# Patient Record
Sex: Female | Born: 1950
Health system: Southern US, Community
[De-identification: ages and names within clinical notes are randomized; demographics above are authoritative.]

## PROBLEM LIST (undated history)

## (undated) DIAGNOSIS — E785 Hyperlipidemia, unspecified: Secondary | ICD-10-CM

## (undated) DIAGNOSIS — N179 Acute kidney failure, unspecified: Secondary | ICD-10-CM

## (undated) DIAGNOSIS — I340 Nonrheumatic mitral (valve) insufficiency: Secondary | ICD-10-CM

## (undated) DIAGNOSIS — K219 Gastro-esophageal reflux disease without esophagitis: Secondary | ICD-10-CM

## (undated) DIAGNOSIS — I10 Essential (primary) hypertension: Secondary | ICD-10-CM

## (undated) DIAGNOSIS — I739 Peripheral vascular disease, unspecified: Secondary | ICD-10-CM

## (undated) DIAGNOSIS — T7840XA Allergy, unspecified, initial encounter: Secondary | ICD-10-CM

## (undated) DIAGNOSIS — Z9889 Other specified postprocedural states: Secondary | ICD-10-CM

## (undated) DIAGNOSIS — H269 Unspecified cataract: Secondary | ICD-10-CM

## (undated) DIAGNOSIS — I480 Paroxysmal atrial fibrillation: Secondary | ICD-10-CM

## (undated) DIAGNOSIS — I2510 Atherosclerotic heart disease of native coronary artery without angina pectoris: Secondary | ICD-10-CM

## (undated) DIAGNOSIS — I499 Cardiac arrhythmia, unspecified: Secondary | ICD-10-CM

## (undated) DIAGNOSIS — G43909 Migraine, unspecified, not intractable, without status migrainosus: Secondary | ICD-10-CM

## (undated) DIAGNOSIS — M1990 Unspecified osteoarthritis, unspecified site: Secondary | ICD-10-CM

## (undated) DIAGNOSIS — F32A Depression, unspecified: Secondary | ICD-10-CM

## (undated) DIAGNOSIS — Z9289 Personal history of other medical treatment: Secondary | ICD-10-CM

## (undated) DIAGNOSIS — H409 Unspecified glaucoma: Secondary | ICD-10-CM

## (undated) DIAGNOSIS — D72829 Elevated white blood cell count, unspecified: Secondary | ICD-10-CM

## (undated) DIAGNOSIS — R51 Headache: Secondary | ICD-10-CM

## (undated) DIAGNOSIS — R52 Pain, unspecified: Secondary | ICD-10-CM

## (undated) DIAGNOSIS — Z789 Other specified health status: Secondary | ICD-10-CM

## (undated) DIAGNOSIS — Z5189 Encounter for other specified aftercare: Secondary | ICD-10-CM

## (undated) DIAGNOSIS — K760 Fatty (change of) liver, not elsewhere classified: Secondary | ICD-10-CM

## (undated) DIAGNOSIS — G459 Transient cerebral ischemic attack, unspecified: Secondary | ICD-10-CM

## (undated) DIAGNOSIS — I639 Cerebral infarction, unspecified: Secondary | ICD-10-CM

## (undated) DIAGNOSIS — M810 Age-related osteoporosis without current pathological fracture: Secondary | ICD-10-CM

## (undated) DIAGNOSIS — R519 Headache, unspecified: Secondary | ICD-10-CM

## (undated) DIAGNOSIS — D45 Polycythemia vera: Secondary | ICD-10-CM

## (undated) DIAGNOSIS — I214 Non-ST elevation (NSTEMI) myocardial infarction: Secondary | ICD-10-CM

## (undated) HISTORY — DX: Polycythemia vera: D45

## (undated) HISTORY — DX: Elevated white blood cell count, unspecified: D72.829

## (undated) HISTORY — PX: OVARIAN CYST REMOVAL: SHX89

## (undated) HISTORY — DX: Encounter for other specified aftercare: Z51.89

## (undated) HISTORY — DX: Depression, unspecified: F32.A

## (undated) HISTORY — DX: Allergy, unspecified, initial encounter: T78.40XA

## (undated) HISTORY — DX: Atherosclerotic heart disease of native coronary artery without angina pectoris: I25.10

## (undated) HISTORY — PX: FRACTURE SURGERY: SHX138

## (undated) HISTORY — DX: Cerebral infarction, unspecified: I63.9

## (undated) HISTORY — PX: ABDOMINAL HYSTERECTOMY: SHX81

## (undated) HISTORY — DX: Peripheral vascular disease, unspecified: I73.9

## (undated) HISTORY — DX: Other specified postprocedural states: Z98.890

## (undated) HISTORY — PX: REFRACTIVE SURGERY: SHX103

## (undated) HISTORY — PX: EYE SURGERY: SHX253

## (undated) HISTORY — DX: Other specified health status: Z78.9

## (undated) HISTORY — DX: Acute kidney failure, unspecified: N17.9

## (undated) HISTORY — DX: Age-related osteoporosis without current pathological fracture: M81.0

## (undated) HISTORY — DX: Unspecified cataract: H26.9

## (undated) HISTORY — DX: Nonrheumatic mitral (valve) insufficiency: I34.0

---

## 1981-12-21 HISTORY — PX: ORIF WRIST FRACTURE: SHX2133

## 1981-12-21 HISTORY — PX: TUBAL LIGATION: SHX77

## 2001-09-20 ENCOUNTER — Other Ambulatory Visit: Admission: RE | Admit: 2001-09-20 | Discharge: 2001-09-20 | Payer: Self-pay

## 2001-10-14 ENCOUNTER — Ambulatory Visit (HOSPITAL_COMMUNITY): Admission: RE | Admit: 2001-10-14 | Discharge: 2001-10-14 | Payer: Self-pay | Admitting: Family Medicine

## 2001-10-14 ENCOUNTER — Encounter: Payer: Self-pay | Admitting: Family Medicine

## 2001-11-07 ENCOUNTER — Encounter: Payer: Self-pay | Admitting: Family Medicine

## 2001-11-07 ENCOUNTER — Ambulatory Visit (HOSPITAL_COMMUNITY): Admission: RE | Admit: 2001-11-07 | Discharge: 2001-11-07 | Payer: Self-pay | Admitting: Family Medicine

## 2003-02-06 ENCOUNTER — Emergency Department (HOSPITAL_COMMUNITY): Admission: EM | Admit: 2003-02-06 | Discharge: 2003-02-06 | Payer: Self-pay | Admitting: *Deleted

## 2003-02-13 ENCOUNTER — Inpatient Hospital Stay (HOSPITAL_COMMUNITY): Admission: EM | Admit: 2003-02-13 | Discharge: 2003-02-15 | Payer: Self-pay | Admitting: Emergency Medicine

## 2003-02-13 ENCOUNTER — Encounter: Payer: Self-pay | Admitting: Emergency Medicine

## 2003-02-13 IMAGING — CT CT HEAD W/O CM
1 series · 16 of 30 positions shown, 20 images · non-contrast
Comparison: none

FINDINGS
CLINICAL DATA: INTERMITTENT HEADACHES FOR FIVE DAYS.  VOMITING.
CT OF THE HEAD WITHOUT CONTRAST
NO COMPARISON.  ROUTINE UNENHANCED STUDY WAS PERFORMED.  THERE IS NO EVIDENCE OF ACUTE INTRACRANIAL
HEMORRHAGE, MASS EFFECT, OR EXTRA-AXIAL FLUID COLLECTION.  THE VENTRICLES AND SUBARACHNOID SPACES
ARE APPROPRIATELY SIZED FOR AGE.  VISUALIZED PARANASAL SINUSES ARE CLEAR.  NO CALVARIAL ABNORMALITY
IS SEEN.
IMPRESSION
NO ACUTE INTRACRANIAL FINDINGS DEMONSTRATED.  NO EVIDENCE OF ACUTE INTRACRANIAL HEMORRHAGE.

[Series 5852: — · axial · 0.49mm/px · z∈[-599,-464]mm · 16 of 30 slices shown, 20 images]
[im 2/30  brain]
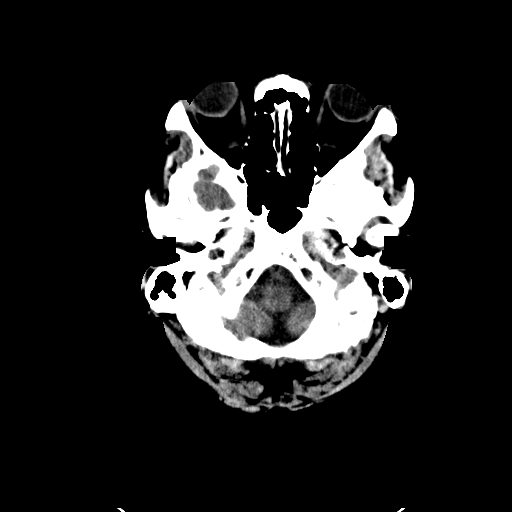
[im 2/30  bone]
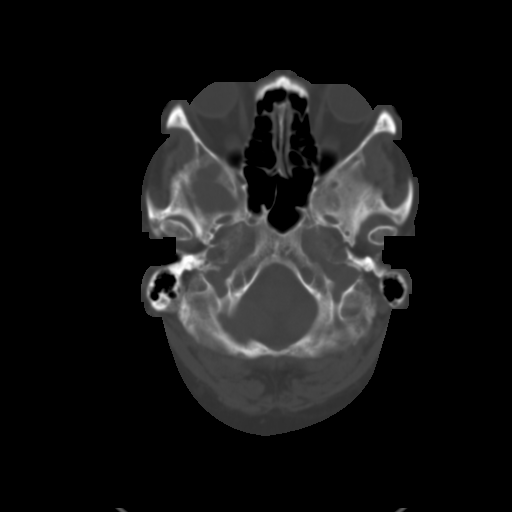
[im 4/30  brain]
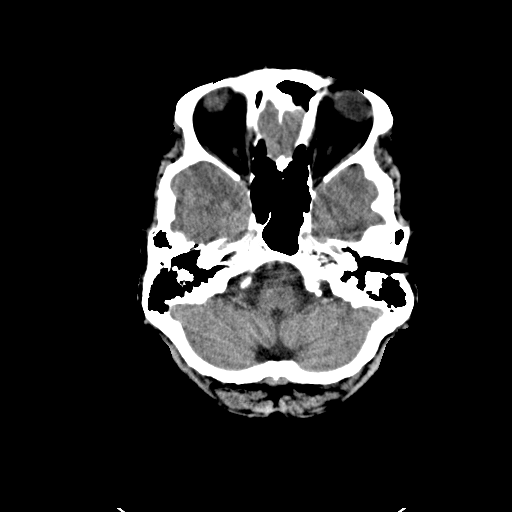
[im 6/30  brain]
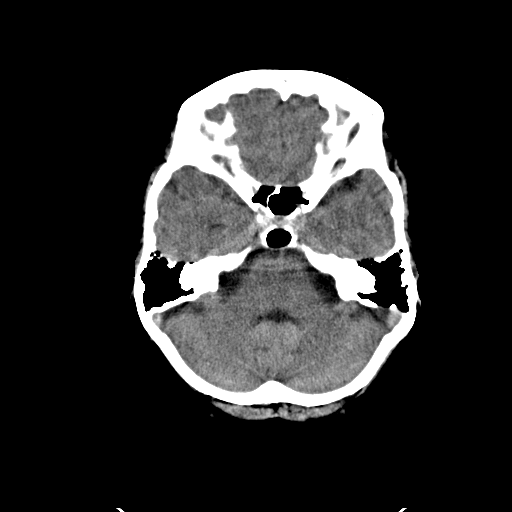
[im 8/30  brain]
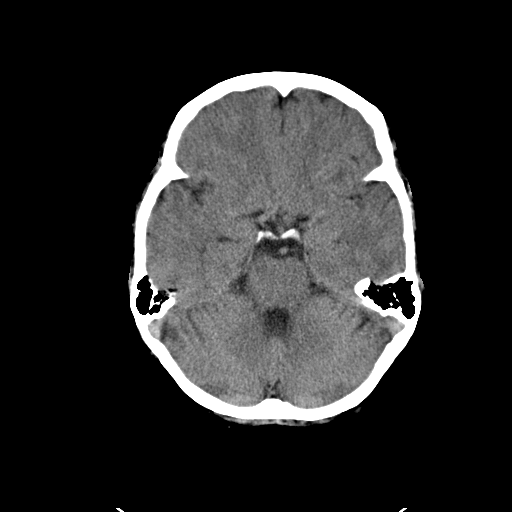
[im 9/30  brain]
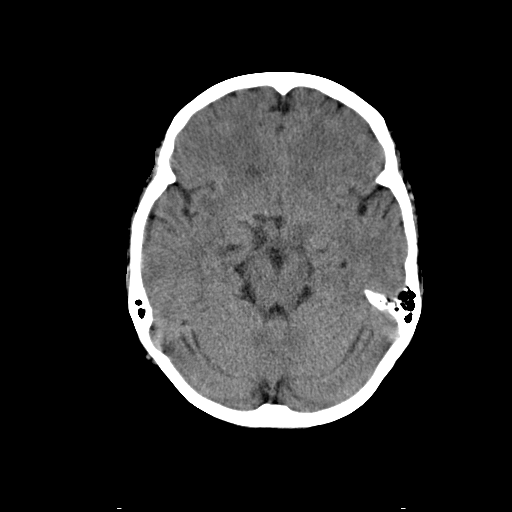
[im 9/30  bone]
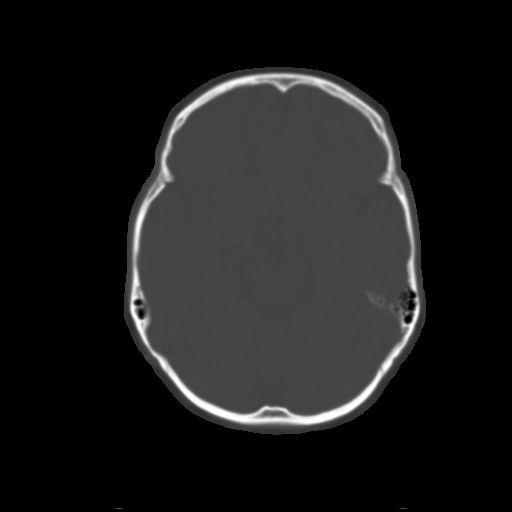
[im 11/30  brain]
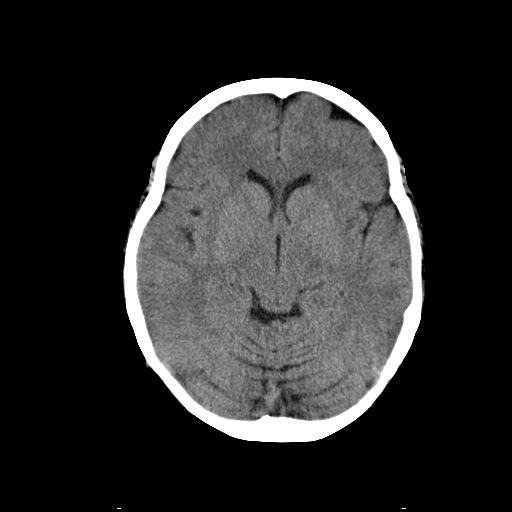
[im 13/30  brain]
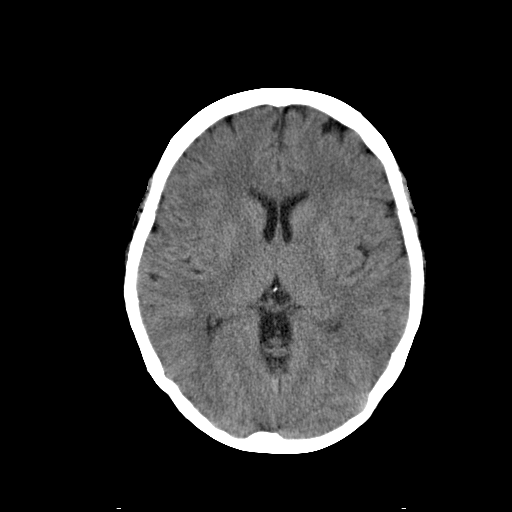
[im 15/30  brain]
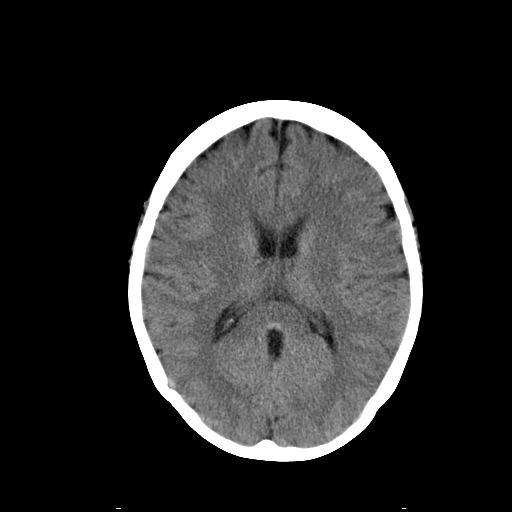
[im 16/30  brain]
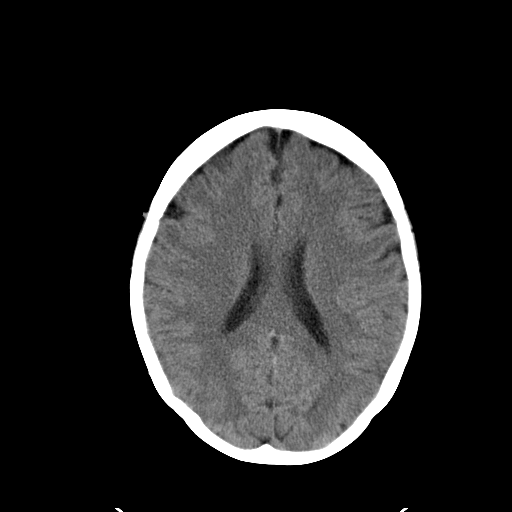
[im 16/30  bone]
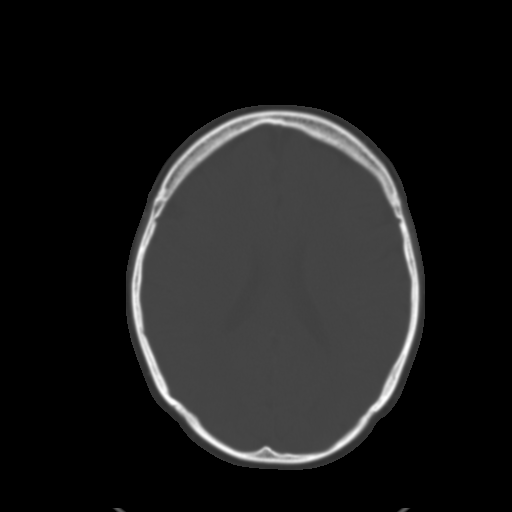
[im 18/30  brain]
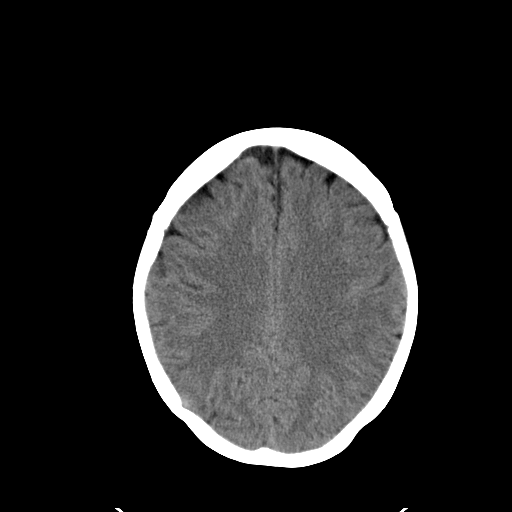
[im 20/30  brain]
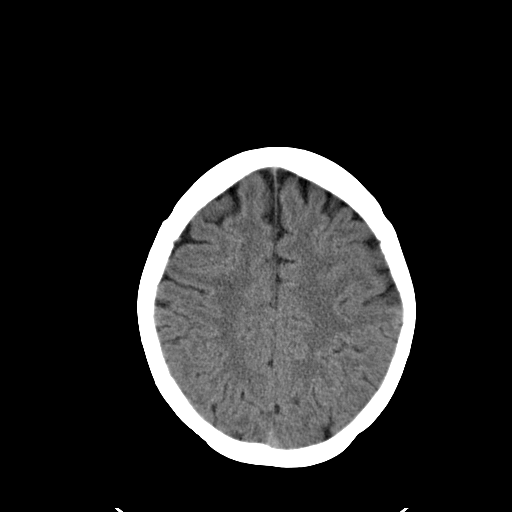
[im 22/30  brain]
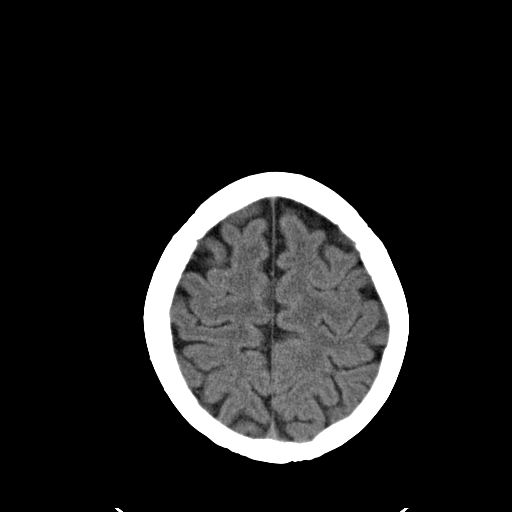
[im 23/30  brain]
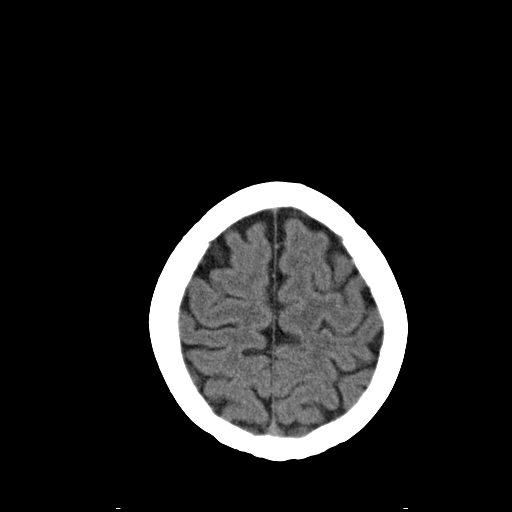
[im 23/30  bone]
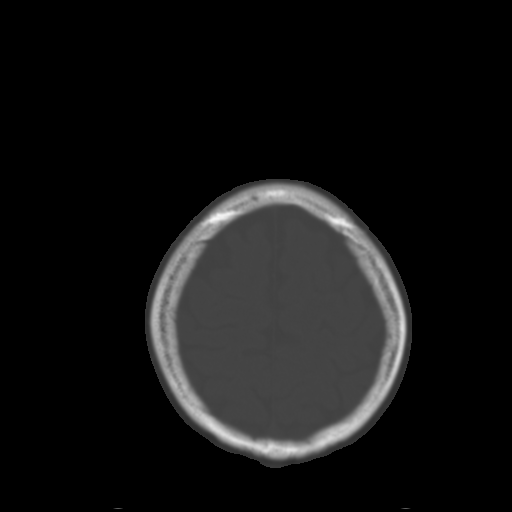
[im 25/30  brain]
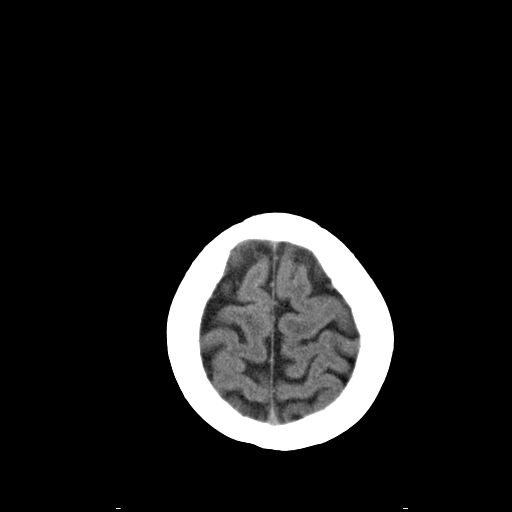
[im 27/30  brain]
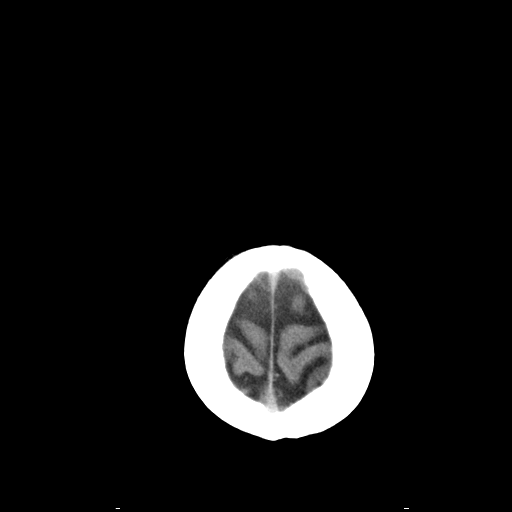
[im 29/30  brain]
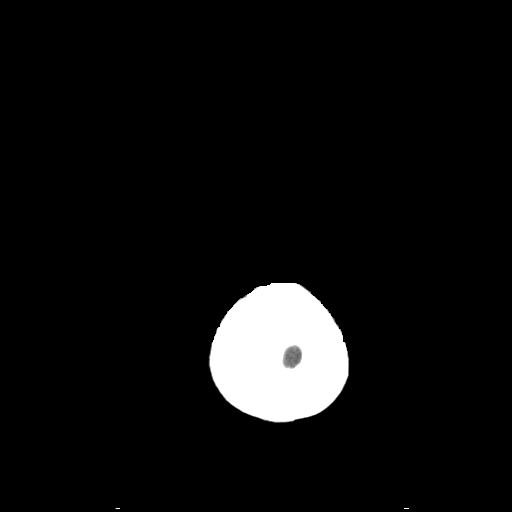

[16 of 30 positions shown; findings below may reference images not displayed]

## 2003-02-14 ENCOUNTER — Encounter: Payer: Self-pay | Admitting: Internal Medicine

## 2004-03-31 ENCOUNTER — Emergency Department (HOSPITAL_COMMUNITY): Admission: EM | Admit: 2004-03-31 | Discharge: 2004-03-31 | Payer: Self-pay | Admitting: *Deleted

## 2004-12-21 DIAGNOSIS — I639 Cerebral infarction, unspecified: Secondary | ICD-10-CM

## 2004-12-21 HISTORY — DX: Cerebral infarction, unspecified: I63.9

## 2005-03-31 ENCOUNTER — Emergency Department (HOSPITAL_COMMUNITY): Admission: EM | Admit: 2005-03-31 | Discharge: 2005-03-31 | Payer: Self-pay | Admitting: Emergency Medicine

## 2005-05-09 ENCOUNTER — Emergency Department (HOSPITAL_COMMUNITY): Admission: EM | Admit: 2005-05-09 | Discharge: 2005-05-09 | Payer: Self-pay | Admitting: Emergency Medicine

## 2005-08-27 ENCOUNTER — Ambulatory Visit (HOSPITAL_COMMUNITY): Admission: RE | Admit: 2005-08-27 | Discharge: 2005-08-27 | Payer: Self-pay | Admitting: Family Medicine

## 2005-09-06 ENCOUNTER — Emergency Department (HOSPITAL_COMMUNITY): Admission: EM | Admit: 2005-09-06 | Discharge: 2005-09-06 | Payer: Self-pay | Admitting: Emergency Medicine

## 2005-09-06 IMAGING — CT CT HEAD W/O CM
1 series · 15 of 30 positions shown, 19 images · non-contrast
Comparison: Unenhanced cranial CT [DATE].

CLINICAL DATA: Headache. Nausea/vomiting.

CRANIAL CT WITHOUT CONTRAST  [DATE]:
TECHNIQUE: 5mm axial images were obtained from the skull base through the
vertex without intravenous contrast.

[Series 177: — · axial · 0.46mm/px · z∈[-640,-505]mm · 15 of 30 slices shown, 19 images]
[im 2/30  brain]
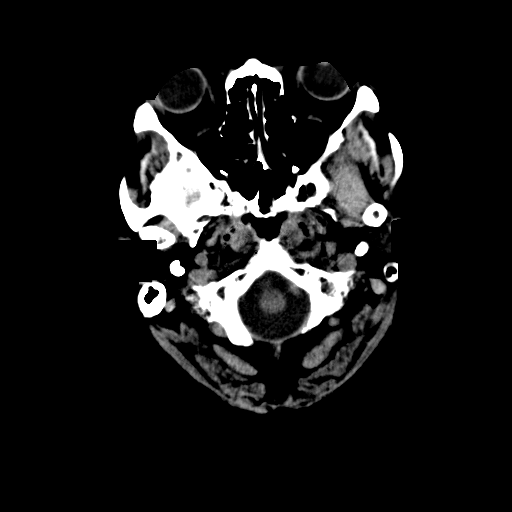
[im 2/30  bone]
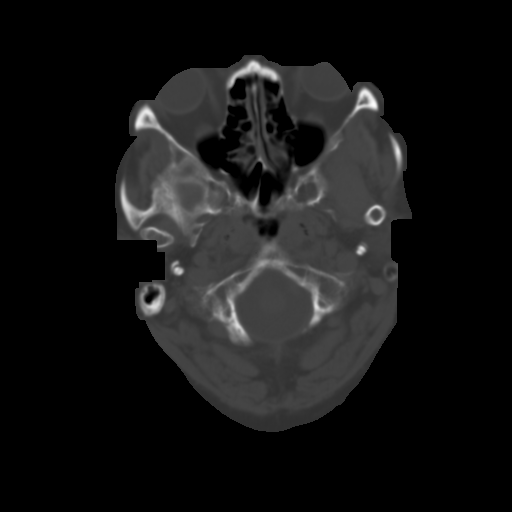
[im 4/30  brain]
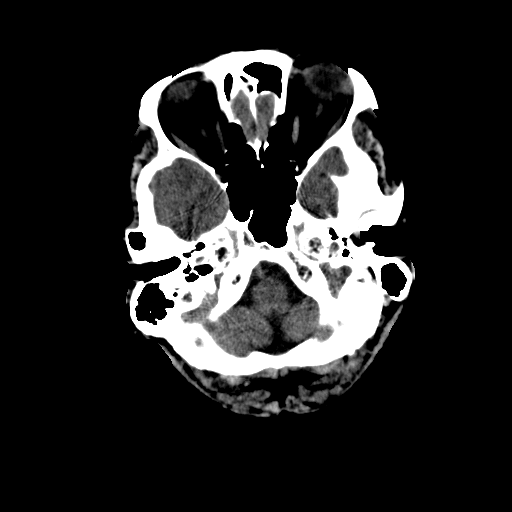
[im 6/30  brain]
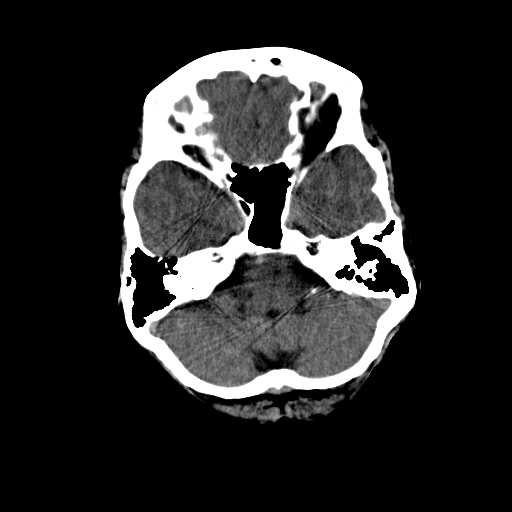
[im 8/30  brain]
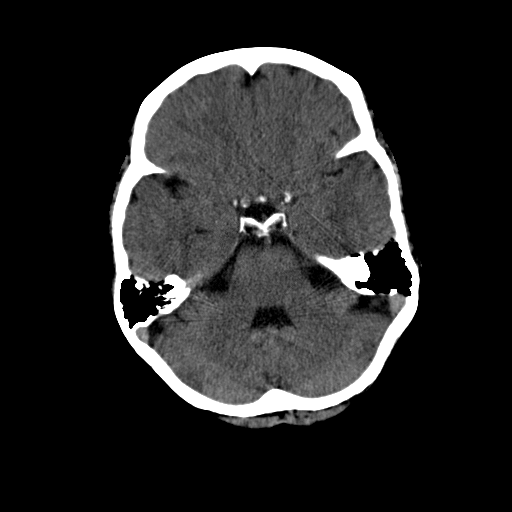
[im 10/30  brain]
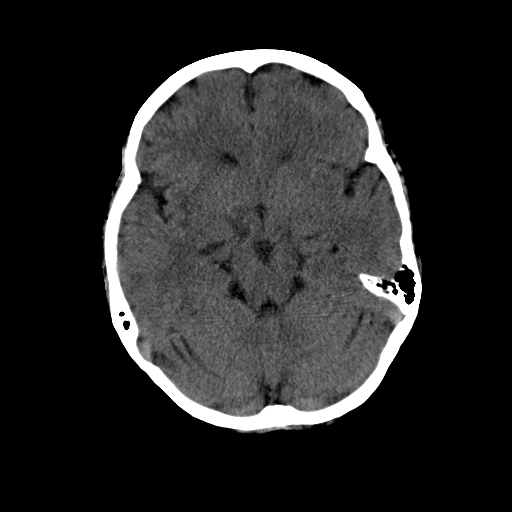
[im 10/30  bone]
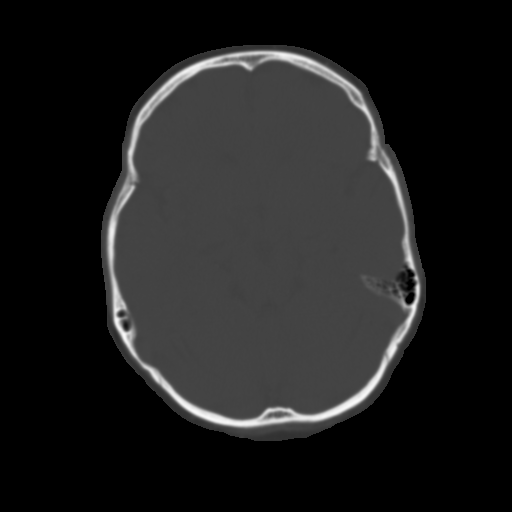
[im 12/30  brain]
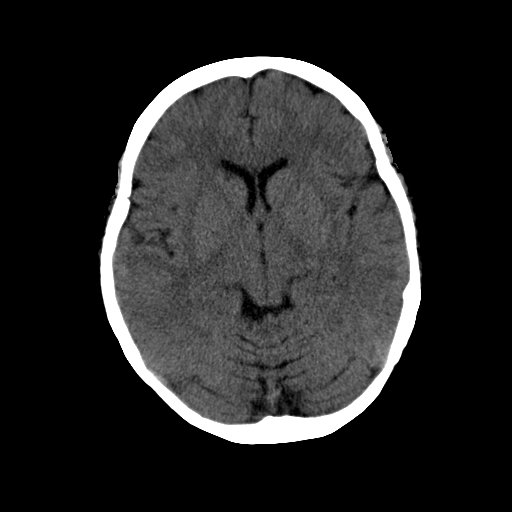
[im 14/30  brain]
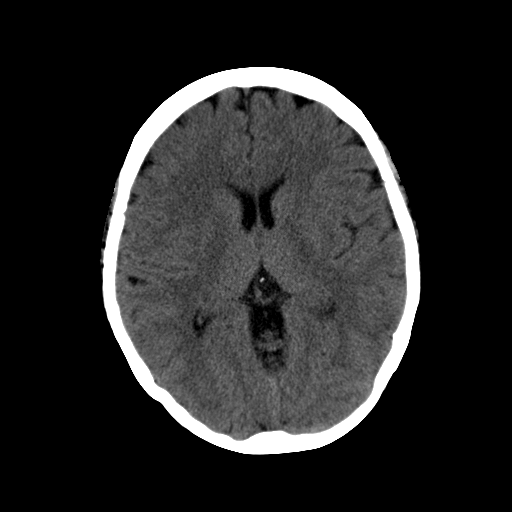
[im 16/30  brain]
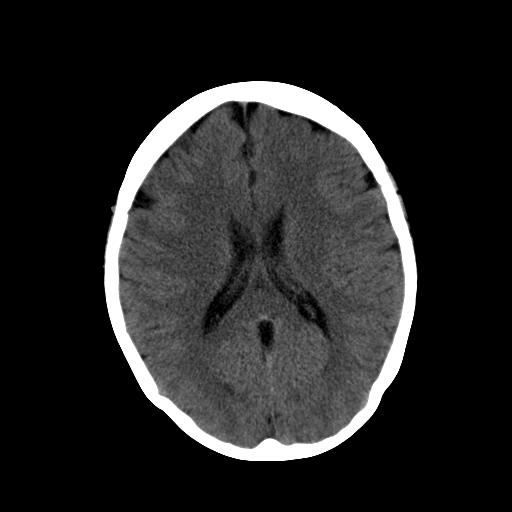
[im 17/30  brain]
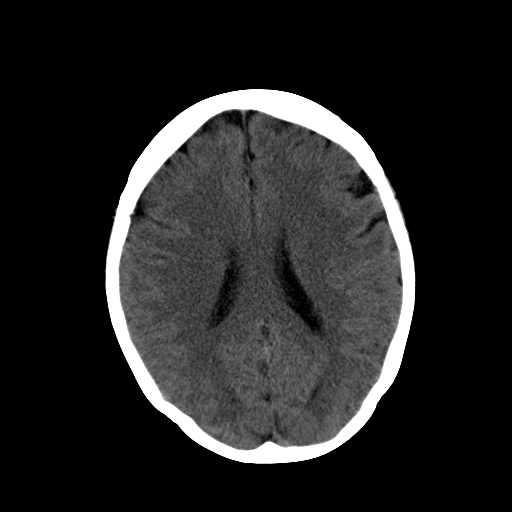
[im 17/30  bone]
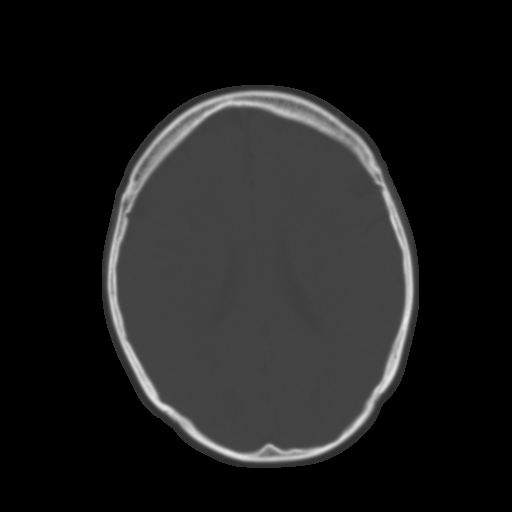
[im 19/30  brain]
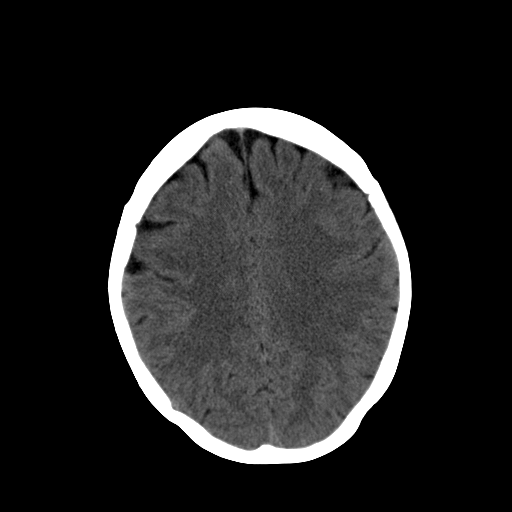
[im 21/30  brain]
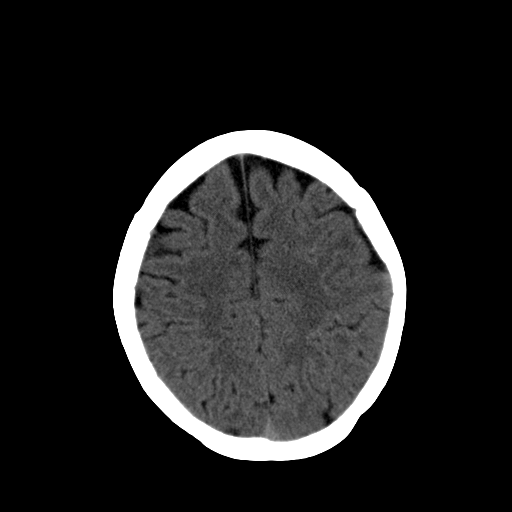
[im 23/30  brain]
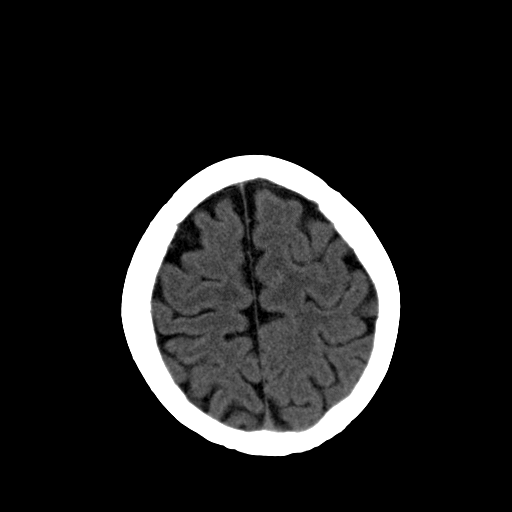
[im 25/30  brain]
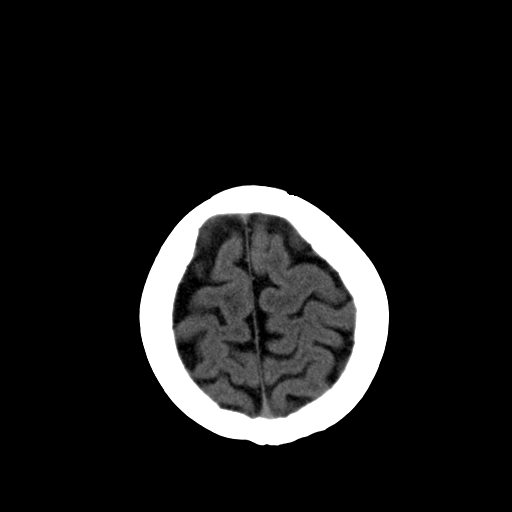
[im 25/30  bone]
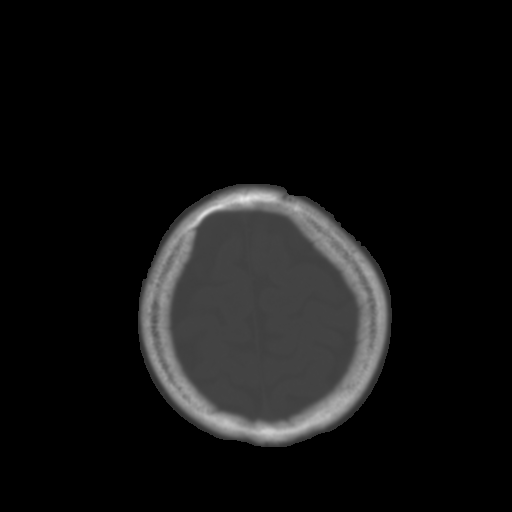
[im 27/30  brain]
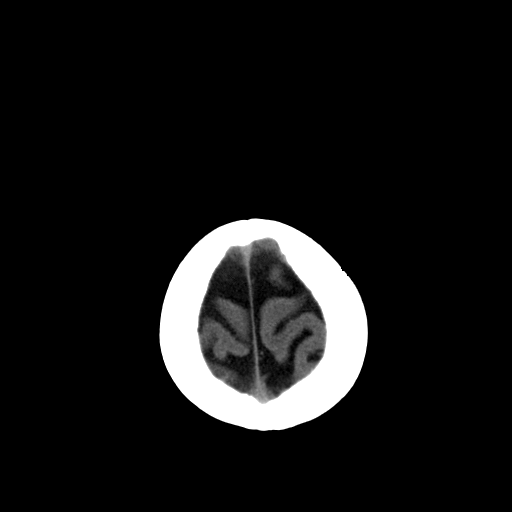
[im 29/30  brain]
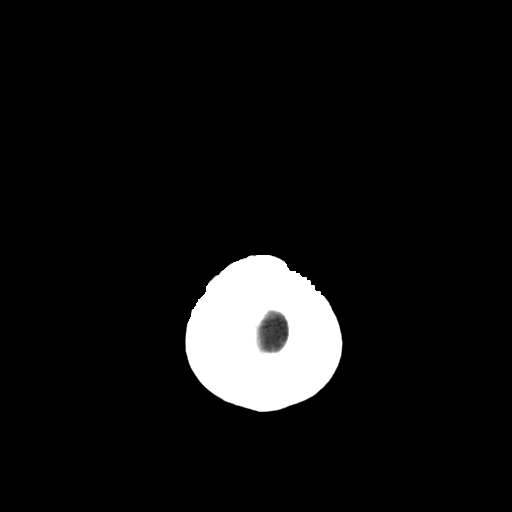

[15 of 30 positions shown; findings below may reference images not displayed]

FINDINGS: The ventricular system is normal in size and appearance for age.
There is no mass-effect or midline shift. There is no hemorrhage or hematoma. No
extra-axial fluid collections are identified. There is an old lacunar stroke in
the head of the right caudate nucleus which is new since the previous
examination but is certainly not acute. Again demonstrated and unchanged is the
lacunar stroke in the posterior-inferior  right thalamus. There is no evidence
of acute stroke at this time. Mild cortical atrophy and mild cerebellar atrophy
are unchanged.

The bone window images demonstrate no focal osseous abnormalities involving the
skull. The visualized paranasal sinuses and the mastoid air cells appear
well-aerated. Mild bilateral carotid siphon atherosclerosis is noted.
IMPRESSION: 1. No acute intracranial abnormalities.
2. Old lacunar strokes in the right basal ganglia and right thalamus.

## 2005-10-04 ENCOUNTER — Emergency Department (HOSPITAL_COMMUNITY): Admission: EM | Admit: 2005-10-04 | Discharge: 2005-10-04 | Payer: Self-pay | Admitting: Emergency Medicine

## 2005-10-27 ENCOUNTER — Emergency Department (HOSPITAL_COMMUNITY): Admission: EM | Admit: 2005-10-27 | Discharge: 2005-10-28 | Payer: Self-pay | Admitting: Emergency Medicine

## 2006-01-02 ENCOUNTER — Emergency Department (HOSPITAL_COMMUNITY): Admission: EM | Admit: 2006-01-02 | Discharge: 2006-01-02 | Payer: Self-pay | Admitting: Emergency Medicine

## 2006-03-06 ENCOUNTER — Emergency Department (HOSPITAL_COMMUNITY): Admission: EM | Admit: 2006-03-06 | Discharge: 2006-03-06 | Payer: Self-pay | Admitting: Emergency Medicine

## 2006-07-03 ENCOUNTER — Emergency Department (HOSPITAL_COMMUNITY): Admission: EM | Admit: 2006-07-03 | Discharge: 2006-07-03 | Payer: Self-pay | Admitting: Emergency Medicine

## 2006-07-19 ENCOUNTER — Ambulatory Visit (HOSPITAL_COMMUNITY): Admission: RE | Admit: 2006-07-19 | Discharge: 2006-07-19 | Payer: Self-pay | Admitting: Cardiovascular Disease

## 2006-07-19 IMAGING — CR DG CHEST 2V
2 series · 2 of 2 positions shown · non-contrast
Comparison: none

CLINICAL DATA: Pre-cardiac catheterization.  
 CHEST - 2 VIEW:

[view not recorded (1 of 2)]
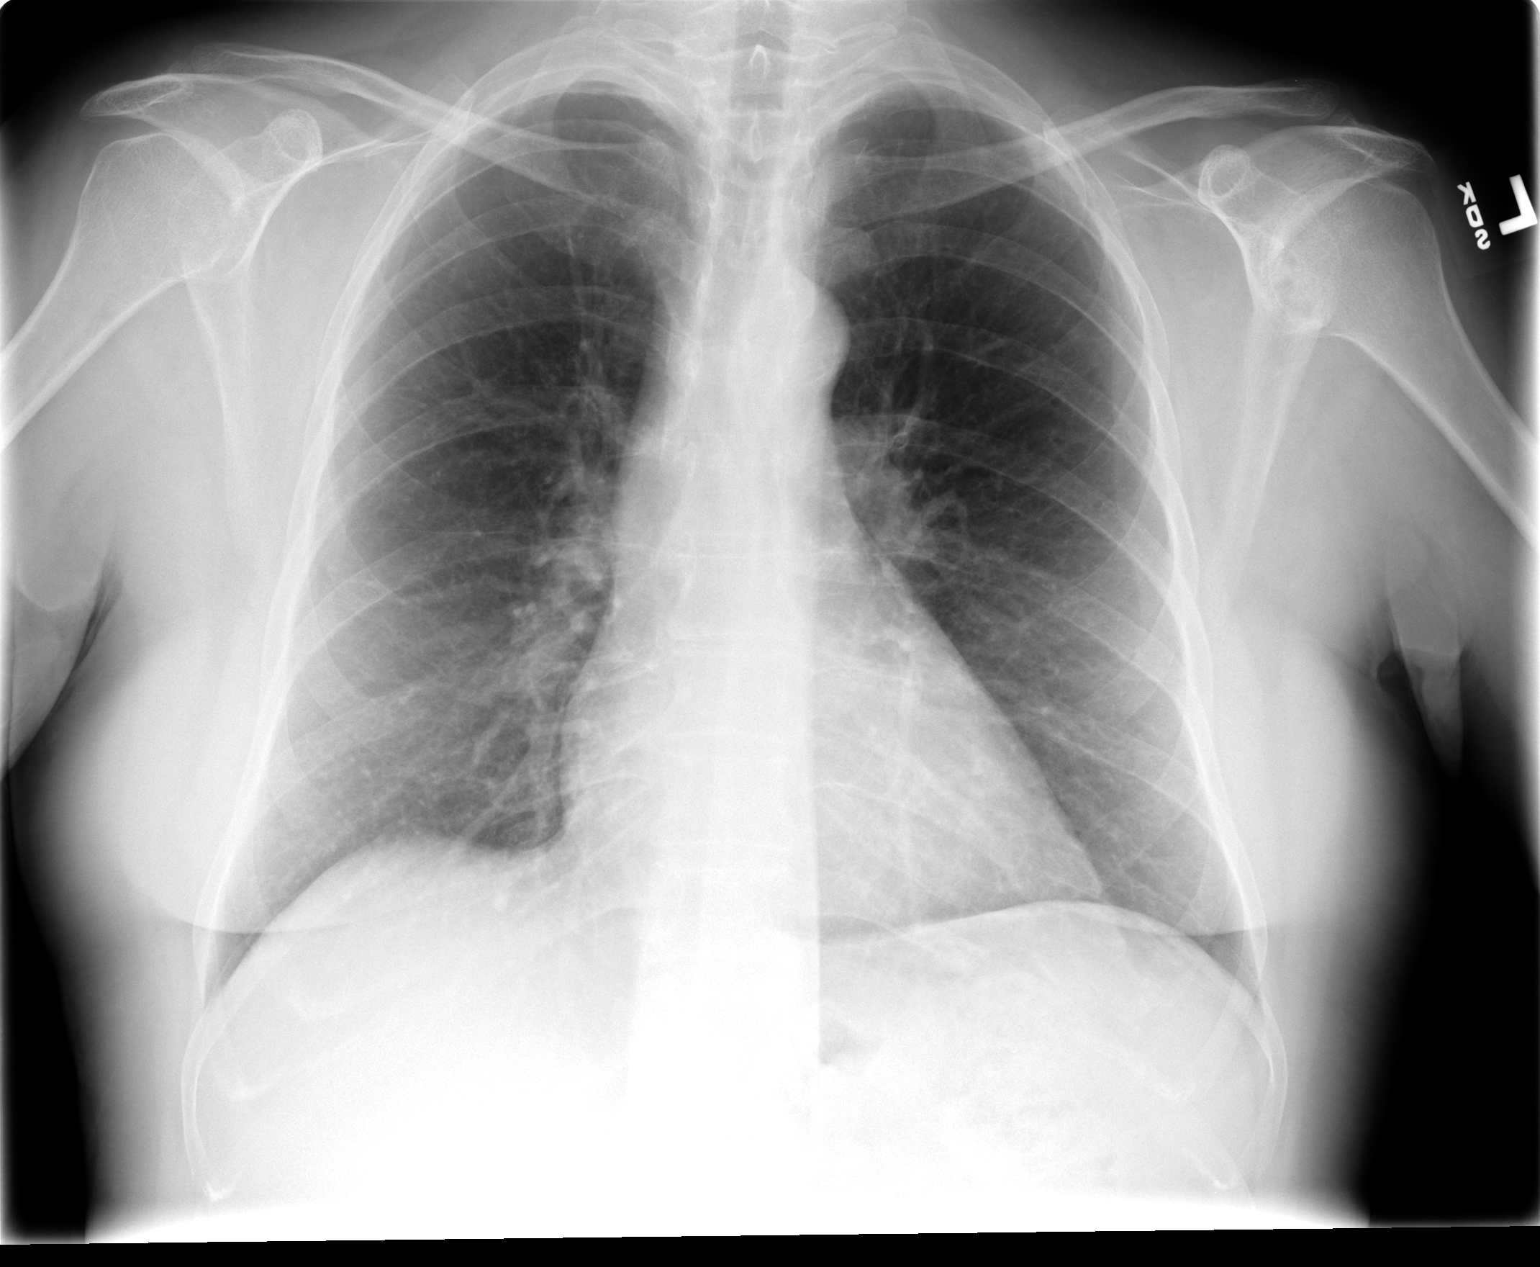

[view not recorded (2 of 2)]
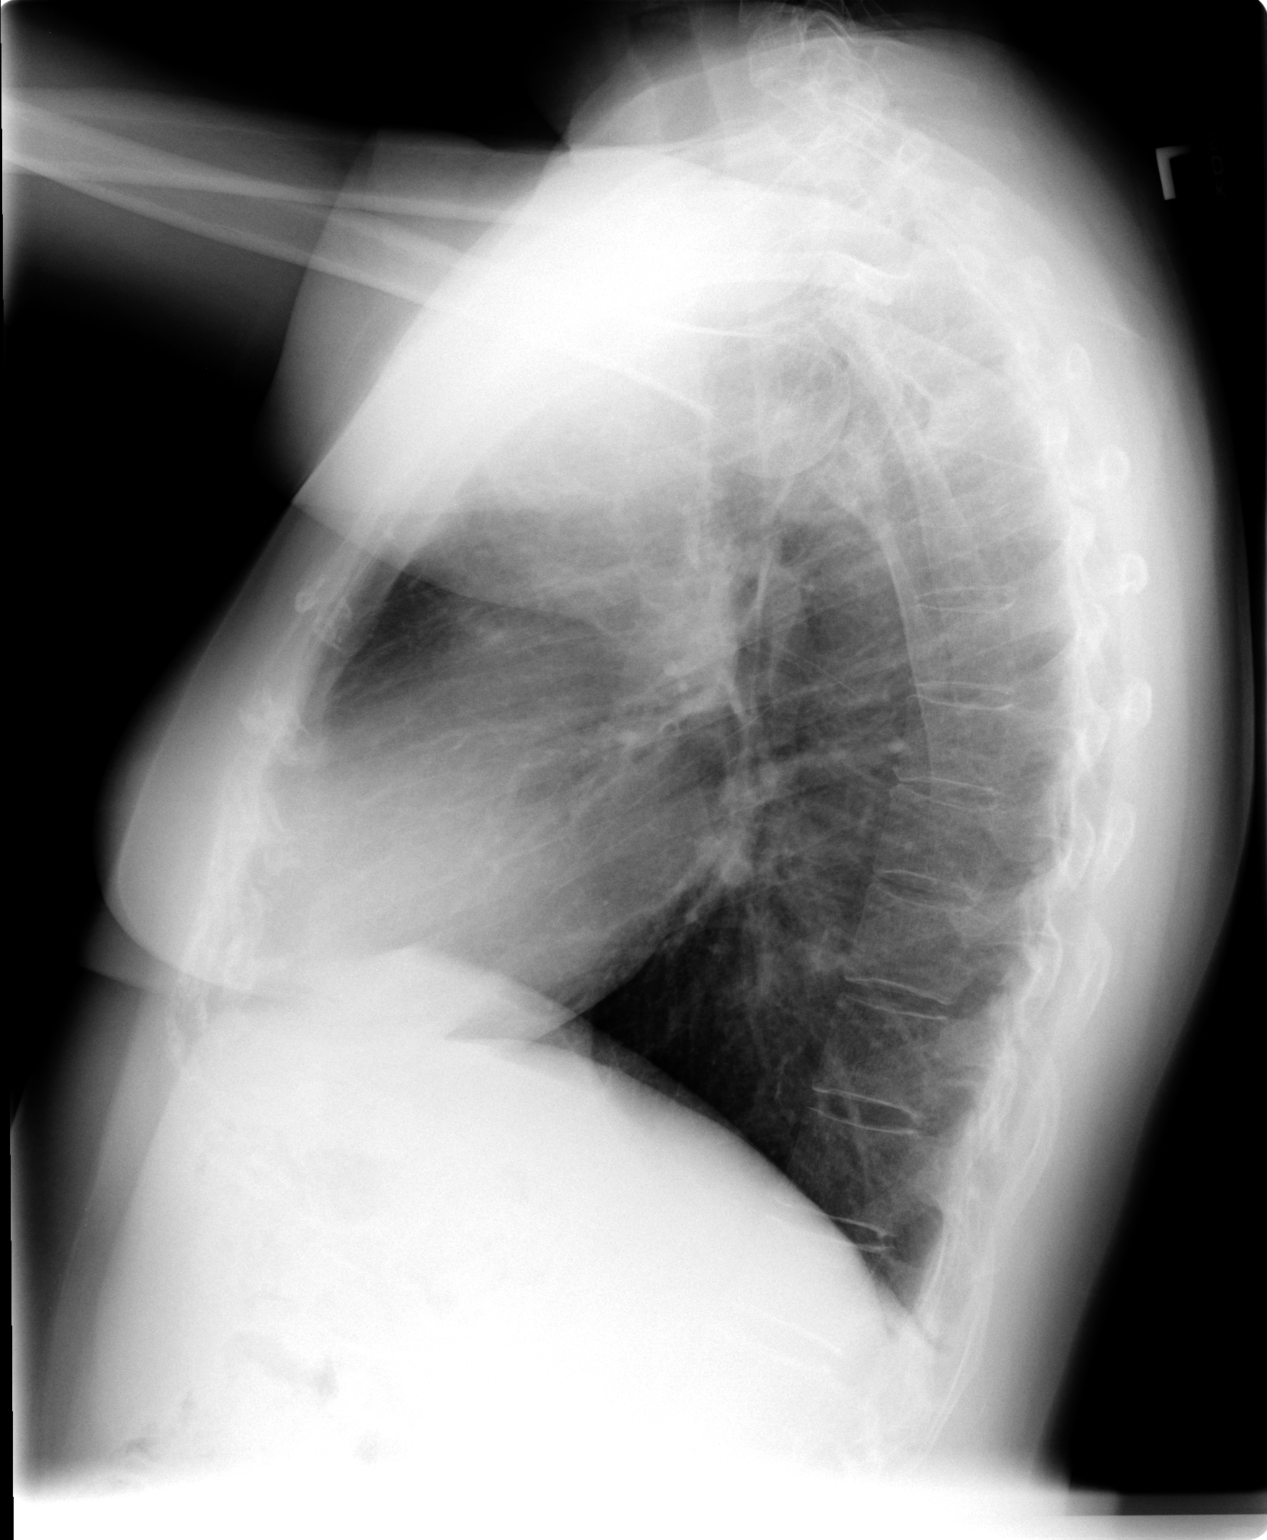

[2 of 2 positions shown; findings below may reference images not displayed]

FINDINGS: Lungs are clear.  Heart size is normal.  No effusion or focal bony abnormality.
IMPRESSION: No acute disease.

## 2006-07-21 DIAGNOSIS — I739 Peripheral vascular disease, unspecified: Secondary | ICD-10-CM

## 2006-07-21 HISTORY — PX: ILIAC ARTERY STENT: SHX1786

## 2006-07-21 HISTORY — DX: Peripheral vascular disease, unspecified: I73.9

## 2006-07-26 ENCOUNTER — Inpatient Hospital Stay (HOSPITAL_COMMUNITY): Admission: RE | Admit: 2006-07-26 | Discharge: 2006-07-27 | Payer: Self-pay | Admitting: Cardiovascular Disease

## 2006-09-06 ENCOUNTER — Ambulatory Visit (HOSPITAL_COMMUNITY): Admission: RE | Admit: 2006-09-06 | Discharge: 2006-09-06 | Payer: Self-pay | Admitting: Family Medicine

## 2006-11-06 ENCOUNTER — Emergency Department (HOSPITAL_COMMUNITY): Admission: EM | Admit: 2006-11-06 | Discharge: 2006-11-07 | Payer: Self-pay | Admitting: Emergency Medicine

## 2006-11-18 ENCOUNTER — Encounter (INDEPENDENT_AMBULATORY_CARE_PROVIDER_SITE_OTHER): Payer: Self-pay | Admitting: Cardiology

## 2006-11-18 ENCOUNTER — Ambulatory Visit (HOSPITAL_COMMUNITY): Admission: RE | Admit: 2006-11-18 | Discharge: 2006-11-18 | Payer: Self-pay | Admitting: Cardiology

## 2007-03-04 ENCOUNTER — Ambulatory Visit (HOSPITAL_COMMUNITY): Admission: RE | Admit: 2007-03-04 | Discharge: 2007-03-04 | Payer: Self-pay | Admitting: *Deleted

## 2007-03-11 ENCOUNTER — Ambulatory Visit (HOSPITAL_COMMUNITY): Admission: RE | Admit: 2007-03-11 | Discharge: 2007-03-12 | Payer: Self-pay | Admitting: Cardiovascular Disease

## 2009-06-14 ENCOUNTER — Emergency Department (HOSPITAL_COMMUNITY): Admission: EM | Admit: 2009-06-14 | Discharge: 2009-06-14 | Payer: Self-pay | Admitting: Emergency Medicine

## 2009-12-08 ENCOUNTER — Inpatient Hospital Stay (HOSPITAL_COMMUNITY): Admission: EM | Admit: 2009-12-08 | Discharge: 2009-12-09 | Payer: Self-pay | Admitting: Emergency Medicine

## 2009-12-08 IMAGING — CR DG CHEST 1V PORT
1 series · 1 of 1 positions shown · non-contrast
Comparison: Chest radiograph performed [DATE]

CLINICAL DATA: Chest pain.

PORTABLE CHEST - 1 VIEW

[view not recorded]
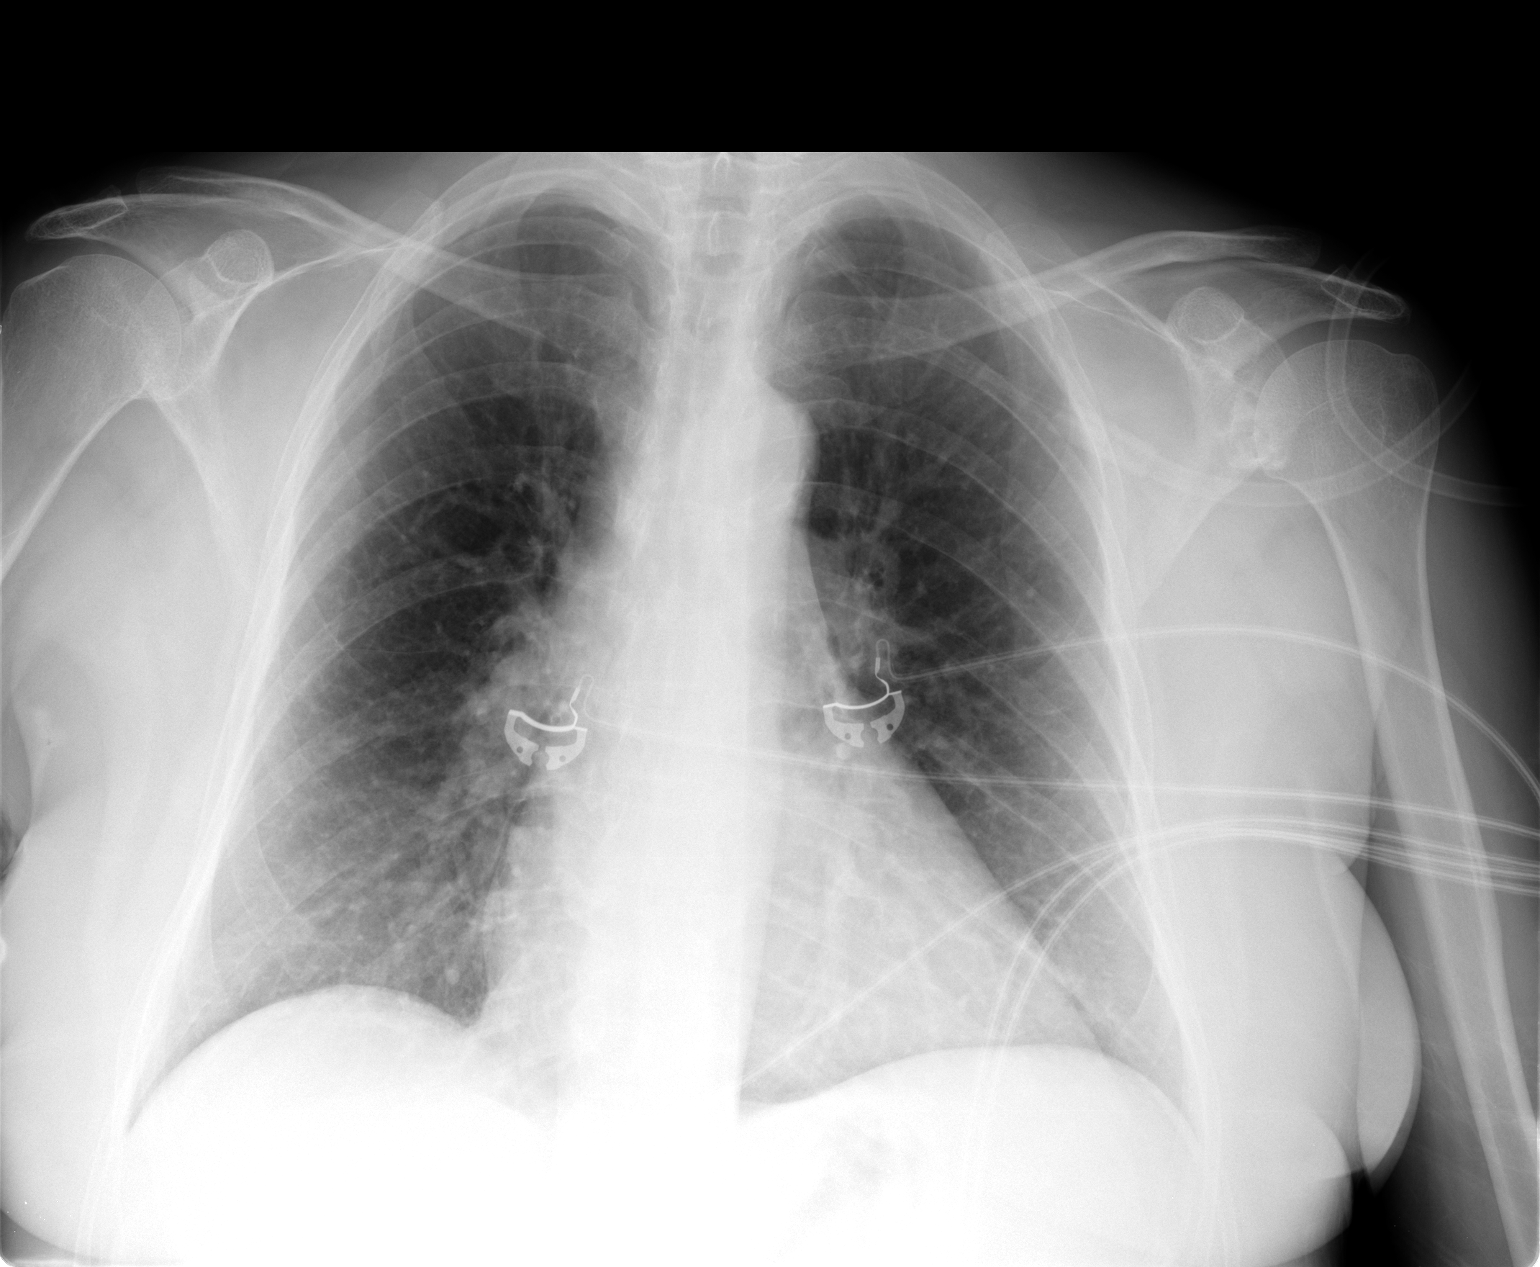

[1 of 1 positions shown; findings below may reference images not displayed]

FINDINGS: The lungs are well-aerated and clear.  There is no
evidence of focal opacification, pleural effusion or pneumothorax.

The cardiomediastinal silhouette is within normal limits.  Mild
calcification is noted within the aortic arch.  No acute osseous
abnormalities are seen.
IMPRESSION: No acute cardiopulmonary process seen.

## 2009-12-09 ENCOUNTER — Encounter (INDEPENDENT_AMBULATORY_CARE_PROVIDER_SITE_OTHER): Payer: Self-pay | Admitting: Internal Medicine

## 2009-12-27 DIAGNOSIS — Z9289 Personal history of other medical treatment: Secondary | ICD-10-CM

## 2009-12-27 HISTORY — DX: Personal history of other medical treatment: Z92.89

## 2011-03-23 LAB — CK TOTAL AND CKMB (NOT AT ARMC)
CK, MB: 16.7 ng/mL — ABNORMAL HIGH (ref 0.3–4.0)
Total CK: 220 U/L — ABNORMAL HIGH (ref 7–177)

## 2011-03-23 LAB — CBC
HCT: 32.6 % — ABNORMAL LOW (ref 36.0–46.0)
HCT: 34 % — ABNORMAL LOW (ref 36.0–46.0)
Hemoglobin: 10.9 g/dL — ABNORMAL LOW (ref 12.0–15.0)
MCV: 88.8 fL (ref 78.0–100.0)
MCV: 89.6 fL (ref 78.0–100.0)
Platelets: 303 10*3/uL (ref 150–400)
RBC: 3.64 MIL/uL — ABNORMAL LOW (ref 3.87–5.11)
RBC: 3.82 MIL/uL — ABNORMAL LOW (ref 3.87–5.11)
RDW: 15.9 % — ABNORMAL HIGH (ref 11.5–15.5)
WBC: 7.9 10*3/uL (ref 4.0–10.5)

## 2011-03-23 LAB — DIFFERENTIAL
Basophils Absolute: 0.1 10*3/uL (ref 0.0–0.1)
Basophils Absolute: 0.1 10*3/uL (ref 0.0–0.1)
Basophils Relative: 1 % (ref 0–1)
Basophils Relative: 1 % (ref 0–1)
Eosinophils Relative: 3 % (ref 0–5)
Eosinophils Relative: 4 % (ref 0–5)
Lymphocytes Relative: 13 % (ref 12–46)
Monocytes Absolute: 0.3 10*3/uL (ref 0.1–1.0)
Monocytes Relative: 4 % (ref 3–12)
Neutro Abs: 5.6 10*3/uL (ref 1.7–7.7)
Neutrophils Relative %: 72 % (ref 43–77)

## 2011-03-23 LAB — LIPID PANEL
Cholesterol: 259 mg/dL — ABNORMAL HIGH (ref 0–200)
HDL: 35 mg/dL — ABNORMAL LOW (ref >39)
VLDL: UNDETERMINED mg/dL (ref 0–40)

## 2011-03-23 LAB — BASIC METABOLIC PANEL
BUN: 11 mg/dL (ref 6–23)
CO2: 25 mEq/L (ref 19–32)
Calcium: 9.4 mg/dL (ref 8.4–10.5)
Chloride: 104 mEq/L (ref 96–112)
Glucose, Bld: 109 mg/dL — ABNORMAL HIGH (ref 70–99)
Potassium: 3.3 mEq/L — ABNORMAL LOW (ref 3.5–5.1)
Sodium: 136 mEq/L (ref 135–145)

## 2011-03-23 LAB — HEPATIC FUNCTION PANEL
ALT: 44 U/L — ABNORMAL HIGH (ref 0–35)
AST: 36 U/L (ref 0–37)
Total Protein: 6.8 g/dL (ref 6.0–8.3)

## 2011-03-23 LAB — CARDIAC PANEL(CRET KIN+CKTOT+MB+TROPI)
CK, MB: 4.9 ng/mL — ABNORMAL HIGH (ref 0.3–4.0)
CK, MB: 9.3 ng/mL — ABNORMAL HIGH (ref 0.3–4.0)
Relative Index: 7.1 — ABNORMAL HIGH (ref 0.0–2.5)
Relative Index: INVALID (ref 0.0–2.5)
Total CK: 131 U/L (ref 7–177)
Total CK: 81 U/L (ref 7–177)

## 2011-03-23 LAB — FERRITIN: Ferritin: 64 ng/mL (ref 10–291)

## 2011-03-23 LAB — COMPREHENSIVE METABOLIC PANEL
AST: 32 U/L (ref 0–37)
Alkaline Phosphatase: 52 U/L (ref 39–117)
Glucose, Bld: 107 mg/dL — ABNORMAL HIGH (ref 70–99)
Sodium: 138 mEq/L (ref 135–145)
Total Bilirubin: 0.5 mg/dL (ref 0.3–1.2)

## 2011-03-23 LAB — POCT CARDIAC MARKERS
CKMB, poc: 12 ng/mL (ref 1.0–8.0)
Troponin i, poc: <0.05 ng/mL (ref 0.00–0.09)

## 2011-03-23 LAB — TROPONIN I: Troponin I: 0.1 ng/mL — ABNORMAL HIGH (ref 0.00–0.06)

## 2011-05-08 NOTE — Consult Note (Signed)
[name], HIPOLITO                          ACCOUNT NO.:  [id number]   MEDICAL RECORD NO.:  [id number]                   PATIENT TYPE:  INP   LOCATION:  A312                                 FACILITY:  [facility]   PHYSICIAN:  Kofi A. Gerilyn Pilgrim, M.D.              DATE OF BIRTH:  [birth year]   DATE OF CONSULTATION:  02/14/2003  DATE OF DISCHARGE:                                   CONSULTATION   REFERRING PHYSICIAN:  Dr. Gracelyn Nurse.   REASON FOR CONSULTATION:  Headaches.   IMPRESSION:  Migraine headaches with aura with moderate frequency but with a  current severe attack.  I believe she should be on prophylactic treatment.   RECOMMENDATION:  1. Inderal 20 mg b.i.d. and titrate to at least 40 mg b.i.d. over a week;     this will be used as a prophylactic treatment.  Additionally, this may     also help with blood pressure management.  2. Recommended Motrin 800 mg and Tylenol to be used alternatively for     abortive treatment.  3. I think a small amount of Dilaudid tablets 2 mg will be helpful for     rescue therapy in case the above abortive treatments are not helpful for     severe headaches.   HISTORY:  This is a 60 year old lady who reports an onset and history of  headaches.  The headaches had been typically mild and respond to over-the-  counter medication, very typically located in the frontal retro-orbital  region, typically on the right side.  She developed a more severe one,  however, left-sided, six days ago.  She tried treating this headache with  over-the-counter medication but the headache did not improve and in fact,  got significantly worse and yesterday resulted in her coming to the  emergency room.  She reportedly has had paresthesias with her headaches and  they occurred with her current headache.  They can occur on either side,  involving the face, arm and leg.  Additionally, she reports having a visual  disturbance before some of her headaches.  She describes  them as multiple  neon lights in a cylindrical shape put together in a rosette.  Additionally,  she also reports blurring and clouding of her vision with her headaches at  times.  She has had nausea with her headaches but for the first time, had  emesis with this current severe headache.  She does report sort of  phonophobia and photophobia.  She has not been on any preventive medications  for headache.   PAST MEDICAL HISTORY:  Past medical history is as stated above.  There is  also a history of hyperlipidemia, hypertension, diverticulosis.  She is  status post tubal ligation, status post right ovarian cystectomy.  She has  been admitted to [facility] in the past for apparently GI bleed and  possible diverticulitis.   ADMISSION  MEDICATIONS:  Over-the-counter analgesics including Tylenol.   ALLERGIES:  PENICILLIN, PERCOCET and DEMEROL, all which cause a rash.   SOCIAL HISTORY:  She smokes a pack of cigarettes per day.  She does not  drink.  She is married and has two children.  Her husband apparently has had  stroke in the past and is at [facility].   FAMILY HISTORY:  Family history is positive for coronary disease.   REVIEW OF SYSTEMS:  Review of systems as stated above.   PHYSICAL EXAMINATION:  VITAL SIGNS:  She is afebrile, temperature 97.3;  pulse 76; respirations 20; blood pressure 146/79.  When she presented here,  she actually had high blood pressure of 193/95.  GENERAL:  General examination showed a thin, well-nourished lady in no acute  distress.  NEUROLOGIC:  Cranial nerves II-XII are intact.  Motor examination shows  normal tone, bulk and strength.  Coordination is unremarkable.  Gait is  normal.   DICTATION ENDED AT THIS POINT.                                               Kofi A. Gerilyn Pilgrim, M.D.    KAD/MEDQ  D:  02/14/2003  T:  02/15/2003  Job:  [id number]

## 2011-05-08 NOTE — Discharge Summary (Signed)
NAMEJAMARIS, [name] NO.:  [id number]   MEDICAL RECORD NO.:  [id number]          PATIENT TYPE:  INP   LOCATION:  4738                         FACILITY:  [facility]   PHYSICIAN:  Nanetta Batty, M.D.   DATE OF BIRTH:  01/27/1951   DATE OF ADMISSION:  07/26/2006  DATE OF DISCHARGE:  07/27/2006                                 DISCHARGE SUMMARY   DISCHARGE DIAGNOSES:  1. Claudication, left iliac and right SFA stenting electively this      admission.  2. Treated hypertension.  3. History of smoking.  4. Treated dyslipidemia.   HOSPITAL COURSE:  [name] is a 60 year old female, mother of two,  grandmother of four, who was referred to [place] by Dr. Janna Arch for peripheral  vascular evaluation. She was seen by Dr. Allyson Sabal on May 18, 2006.  Cardiolite  study and Doppler studies were obtained as an outpatient. She had ABIs of  0.4 bilaterally with an occluded left SFA and high-grade right SFA stenosis.  Her Cardiolite was normal. She was admitted for PV angiogram which was done  July 26, 2006. This revealed 70% left iliac, total left SFA with three-  vessel runoff, total right SFA with three-vessel runoff. Dr. Allyson Sabal opened  the right SFA with three stents in the left common iliac. Postprocedure she  did have an episode of hypotension which responded to IV fluid bolus. Dr.  Allyson Sabal saw her on the morning of the 7th. Felt she could be discharged. We  gave her medicines and watched her an hour prior to discharge. She was up  and ambulating, and we let her go home late on the 7th.   DISCHARGE MEDICATIONS:  1. Aspirin 81 mg a day.  2. Plavix 75 mg a day.  3. Micardis HCTZ 80/12.5 daily.  4. Caduet 540 daily.  5. Zantac p.r.n.  6. Darvocet p.r.n.   LABORATORIES:  Urinalysis unremarkable. White count 13.2, hemoglobin 12.8,  hematocrit 37.1, platelets 617.   DISPOSITION:  The patient is discharged in stable condition and will follow  up with Dr. Allyson Sabal, Dr. Domingo Sep, and  Dr.  Janna Arch.      Abelino Derrick, P.A.      Nanetta Batty, M.D.  Electronically Signed    LKK/MEDQ  D:  08/10/2006  T:  08/11/2006  Job:  [id number]   cc:   Melvyn Novas, MD

## 2011-05-08 NOTE — H&P (Signed)
[name], [name]                          ACCOUNT NO.:  [id number]   MEDICAL RECORD NO.:  [id number]                   PATIENT TYPE:  EMS   LOCATION:  ED                                   FACILITY:  [facility]   PHYSICIAN:  Gracelyn Nurse, M.D.              DATE OF BIRTH:  [birth year]   DATE OF ADMISSION:  02/13/2003  DATE OF DISCHARGE:                                HISTORY & PHYSICAL   CHIEF COMPLAINT:  Headache and left arm weakness.   HISTORY OF PRESENT ILLNESS:  This is a 60 year old white female who presents  with a 4 day history of headache.  The headache is frontal and radiates to  the back.  She has nausea and vomiting, also photophobia.  Today she started  having some left facial and left arm numbness and this is the main reason  she came in.  She had a history of migraine headaches in years past but has  not had one in many years.  She is currently undergoing some stressful  situations at home, which may be a trigger.   PAST MEDICAL HISTORY:  1. Periods of migraine headaches.  2. Hyperlipidemia.  3. Hypertension.  4. Diverticulosis.  5. Status post right ovarian cyst removal.  6. Status post tubal ligation.   ALLERGIES:  Penicillin, Demerol and Percocet.   CURRENT MEDICATIONS:  Over-the-counter Tylenol.   SOCIAL HISTORY:  She smokes about a pack of cigarettes a day.  Does not  drink alcohol.  She is married with 2 children.  Her husband is in [facility].   FAMILY HISTORY:  Her mother and father both had coronary artery disease.   REVIEW OF SYSTEMS:  As per History of Present Illness.  Remainder of systems  negative.   PHYSICAL EXAMINATION:  VITAL SIGNS:  Temperature is 96.9, pulse 68,  respirations 24, blood pressure 193/95.  GENERAL:  This is a well-nourished white female who is in obvious pain.  HEENT:  Pupils equal, round and reactive to light.  Equal ocular movement  intact.  Oral mucosa is moist.  Oropharynx is clear.  CARDIOVASCULAR:   Regular rate and rhythm.  No murmurs.  LUNGS:  Clear to auscultation.  ABDOMEN:  Soft, nontender, nondistended.  Bowel sounds positive.  EXTREMITIES:  There is no edema.  NEUROLOGIC:  Cranial nerves II through XII grossly intact.  No focal  deficits.  No meningeal signs.  There is a slight bit of numbness in the  left arm.  Cannot discern any left facial numbness.  SKIN:  Moist with no rash.   ADMISSION LABORATORIES:  White blood cells 13.4, hemoglobin is 14, platelets  at 333.  Sodium is 137, potassium 3.9, chloride 103, CO2 29, BUN 5,  creatinine 0.7, glucose is 111.  Urinalysis is negative.  CT of the brain is  normal.   ASSESSMENT AND PLAN:  Problem #1.  Complex migraine.  This can likely account for her headache and  her left-sided numbness.  It is unusual for this to last 4 days, though.  We  will go ahead and give her a combination of an NSAID, Compazine, may even  try ergotamine to see if she gets relief.  If her numbness does not clear  up, we may need to get further imaging studies such as an MRI.  Problem #2.  Nausea and vomiting.  We will treat this with antiemetics.  This will probably calm down whenever we can get the headache under control.  Problem #3.  Leukocytosis.  There is no sign of infection, no meningeal  signs.  Urinalysis is clean.  We will go ahead and check a chest x-ray to be  complete.  This may be stress demargination.  Problem #4.  Hypertension.  We will start some hydrochlorothiazide.  She  said she cannot afford medications but this is probably the least expensive  antihypertensive we can prescribe.                                                Gracelyn Nurse, M.D.    JDJ/MEDQ  D:  02/13/2003  T:  02/14/2003  Job:  [id number]

## 2011-05-08 NOTE — Cardiovascular Report (Signed)
[name], [name]                ACCOUNT NO.:  [id number]   MEDICAL RECORD NO.:  [id number]          PATIENT TYPE:  AMB   LOCATION:  SDS                          FACILITY:  [facility]   PHYSICIAN:  [name], M.D.   DATE OF BIRTH:  [birth year]   DATE OF PROCEDURE:  03/11/2007  DATE OF DISCHARGE:                            CARDIAC CATHETERIZATION   PROCEDURE:  Peripheral angiogram/percutaneous transluminal angioplasty.   INDICATION:  [name] is a 60 year old mildly overweight white female  with a history of PVOD, status post left common and external iliac  artery PTCA and stenting as well as percutaneous revascularization of an  occluded SFA with nitinol self-expanding stents.  She had resolution of  her claudication and she had stopped smoking at that time, but had  resumed smoking since.  Her recent Doppler showed an ABI of 0.78 on the  right and 0.66 on the left with a suggestion of progression of disease  in the proximal right SFA stented segment.  She is scheduled to move to  [place], [place] in the upcoming future.  She presents now for  angiography and potential revascularization of her right lower  extremity.   DESCRIPTION OF PROCEDURE:  The patient was brought to the 2nd floor  [facility] angiographic suite in the postabsorptive state.  She was  premedicated with p.o. Valium, IV Versed and fentanyl.  Her left groin  was prepped and shaved in the usual sterile fashion.  1% Xylocaine was  used for local anesthesia.  A 5-French sheath was inserted into the left  femoral artery using a Doppler-tipped Smart needle.  A 5-French tennis  racket catheter was used for midstream and distal abdominal aortography  with bifemoral runoff using bolus-chase step-table digital subtraction  technique.  Visipaque dye was used for the entirety of the case.  Retrograde aortic pressures were monitored during the case.   ANGIOGRAPHIC RESULTS:  1. Abdominal aorta:      a.     Renal arteries  -- normal.      b.     Infrarenal abdominal aorta -- mild to moderate diffuse       atherosclerotic changes.  2. Left lower extremity:      a.     Widely patent left common and external iliac artery nitinol       self-expanding stents.      b.     Occluded SFA at its ostium with reconstitution of the SFA in       Hunter's canal by profunda femoris collaterals, which were brisk.       There is three-vessel runoff.  3. Right lower extremity:      a.     Widely patent, diffusely stented right SFA with a focal 90%       proximal and in-stent restenosis and diffuse 30% to 40% in-stent       restenosis with three-vessel runoff.   DESCRIPTION OF PROCEDURE:  Contralateral access was obtained with an  0.35 angled Glidewire, a 5-French crossover catheter and 6-French Cordis  brite-tip crossover sheath.  The patient received 2500 units of  heparin  intravenously.  A 0.35 Wholey wire was then used to cross the diseased  segment and angioplasty was performed with a 5 x 2 Powerflex at 8  atmospheres for 590 seconds, resulting in reduction of focal 90% in-  stent restenosis to 0% residual without dissection and with excellent  flow.  The patient tolerated the procedure well.  The guidewire was  removed and the sheath was withdrawn across the iliac bifurcation and  then removed since the ACT was less than 170.  Pressure will be held on  the groin to achieve hemostasis.  The patient left lab in stable  condition.  She will  be discharged home later today as an outpatient and will be hydrated in  the interim.  She will see me back in 1 week, at which time she will get  followup lower extremity Dopplers and ABIs prior to her office visit,  after which she will most likely move to [place], [place].      [name], M.D.  Electronically Signed     JB/MEDQ  D:  03/11/2007  T:  03/11/2007  Job:  [id number]   cc:   [facility] Second Floor PV Angiographic Suite  [address] [place], [place] [address]  [facility]  Jolayne Panther [place]

## 2011-05-08 NOTE — Cardiovascular Report (Signed)
NAMEJASHANTI, CLINKSCALE NO.:  [id number]   MEDICAL RECORD NO.:  [id number]           PATIENT TYPE:   LOCATION:                                 FACILITY:   PHYSICIAN:  Nanetta Batty, M.D.        DATE OF BIRTH:   DATE OF PROCEDURE:  07/26/2006  DATE OF DISCHARGE:                              CARDIAC CATHETERIZATION   Ms. Bresnan is a 60 year old widowed white female mother of two who is  disabled because of PVOD.  She is referred by Dr. Delbert Harness for further  evaluation.  She had a cardiac stress test which is non-ischemic.  Problems  include tobacco abuse, hypertension, and hyperlipidemia, as well as family  history.  Lower extremity Dopplers revealed markedly diminished ABIs in the  0.4 range.  She presents now for angiography and potential intervention.   PROCEDURE DESCRIPTION:  Patient brought to the second floor PV angiographic  suite in the postabsorptive state.  She was premedicated p.o. Valium.  Left  groin was prepped and shaved in the usual sterile fashion.  1% Xylocaine was  used for local anesthesia.  A 5-French sheath was inserted into the left  femoral artery using standard Seldinger technique.  5-French tennis racket  catheter was used for mid stream and distal abdominal aortography, bifemoral  runoff.  Visipaque dye was used for the entirety of the case.  Retrograde  aortic pressure was monitored during the case.   ANGIOGRAPHIC RESULTS:  1. Abdominal aorta.      a.     Renal arteries:  Normal.      b.     Infrarenal abdominal aorta:  Moderate atherosclerotic changes.  2. Left lower extremity;      a.     70% eccentric mid left common iliac artery stenosis.      b.     60% long segmental proximal to mid left external iliac artery       stenosis.      c.     Total SFA with reconstitution of the above-the-knee pop by       profunda femoris collaterals __________  three-vessel runoff.  3. Right lower extremity;      a.     Long segmental 90%  proximal and mid right SFA stenosis with some       reconstitution of the popliteal Hunter's canal with three-vessel       runoff.   PROCEDURE DESCRIPTION:  Patient received a total 5000 units of heparin  intravenously.  Contralateral access was obtained with a crossover catheter,  035 angled glide and Wholey wire.  The [facility] wire was placed down the SFA  and PTA was performed of a fairly focal 95% lesion in the proximal SFA  resulting in dissection.  At that time it was decided to stent the SFA from  Hunter's canal up to near the profunda bifurcation.  This was performed with  overlapping 6 mm in diameter and various length Smart self-expanding stents  with post dilatation using __________  power flexes.  The ultimate result  was reduction  a long 90% segmental proximal mid right SFA stenosis to 0%  residual except the very ostium and proximal 1-2 cm did appear to have 50-  60% residual stenosis and this was not approached __________  proximity to  the profunda.   Following this the crossover sheath was withdrawn into the iliac system on  the left.  Stenting was performed at the 7 x 29 [employer] __________  pre  mount resulting in reduction of a 70% eccentric ulcerated-appearing mid to  distal left common iliac artery stenosis to 0% residual.  There was a  proximal edge dissection which was sealed with an 8 x 3 Smart stent at the  origin of the iliac down to the balloon expandable stent.  The external  iliac was then stented with a 7 x 6 Smart and post dilated with a __________  Powerflex resulting in reduction of a long 70% stenosis in the common and  external iliac artery on the left to 0% residual.  Completion aortogram was  performed revealing widely patent iliac arteries.   IMPRESSION:  Successful complex right SFA and left external and common iliac  artery PTA and stenting.  Patient will be loaded with Plavix.  Sheaths will  be removed once the ACT falls below 200 and pressure will  be held on the  groin to achieve hemostasis.  She will be hydrated overnight, discharged  home in the morning on aspirin and Plavix and will obtain follow-up Dopplers  and ABIs at which time she will see me back in the office.  She left the  laboratory in stable condition.      Nanetta Batty, M.D.  Electronically Signed     JB/MEDQ  D:  07/26/2006  T:  07/26/2006  Job:  [id number]   cc:   Delbert Harness, MD

## 2011-05-08 NOTE — Discharge Summary (Signed)
[name], [name]                          ACCOUNT NO.:  [id number]   MEDICAL RECORD NO.:  [id number]                   PATIENT TYPE:  INP   LOCATION:  A312                                 FACILITY:  [facility]   PHYSICIAN:  Hanley Hays. Dechurch, M.D.           DATE OF BIRTH:  1951/06/23   DATE OF ADMISSION:  02/13/2003  DATE OF DISCHARGE:  02/15/2003                                 DISCHARGE SUMMARY   DIAGNOSES:  1. Complex migraine with aura.  2. Hypertension.  3. History of hyperlipidemia.  Lipid profile pending.  4. Tobacco abuse.  5. History of diverticulosis.  6. Status post right ovarian cystectomy.  7. Status post tubal ligation.   ALLERGIES:  1. PENICILLIN.  2. DEMEROL.  3. PERCOCET.   DISPOSITION:  The patient discharged to home.   FOLLOW UP:  Follow up with Dierdre Forth at [facility] February 26, 2003.  Follow up with Dr. Gerilyn Pilgrim as needed.   MEDICATIONS:  1. Inderal 20 mg b.i.d. for one week then 40 mg b.i.d.  2. Hydrochlorothiazide 12.5 mg daily.   HOSPITAL COURSE:  The patient is a 60 year old Caucasian female with history  of migraine headaches.  She presents with a severe headache, intractable  nausea and vomiting, with associated aura and right upper extremity numbness  and weakness.  She was admitted to the hospital due to the inability to  maintain p.o.  She was also noted to be hypertensive at the time of  presentation.  She was started on hydrochlorothiazide, which she tolerated  well, and seen by neurology in consultation, who felt that prophylactic  therapy would be warranted and started her on Inderal again, which she has  tolerated well.  By the second hospital day, her nausea had totally  resolved.  She was taking a full diet.  She had no more headache and was  feeling quite well and stable for discharge.   PHYSICAL EXAMINATION:  VITAL SIGNS:  At the time of discharge, blood  pressure is 136/76, pulse is 74 and regular, respirations are  unlabored.  NECK:  Supple.  There is no JVD or adenopathy.  LUNGS:  Clear to auscultation but diminished.  HEART:  Regular.  No murmur.  ABDOMEN:  Flat, soft, nontender.  EXTREMITIES:  Without clubbing, cyanosis, or edema.  NEUROLOGIC:  Completely intact.   ASSESSMENT AND PLAN:  As noted above.  The plan of care is discussed with  the patient, who has good understanding.  Lipid panel is pending at the time  of discharge but will be forwarded to her referring physicians and primary  care providers.                                               Hanley Hays Josefine Class, M.D.  FED/MEDQ  D:  02/15/2003  T:  02/15/2003  Job:  [id number]

## 2011-05-08 NOTE — Discharge Summary (Signed)
NAMEROSMERY, DUGGIN NO.:  [id number]   MEDICAL RECORD NO.:  [id number]          PATIENT TYPE:  OIB   LOCATION:  2033                         FACILITY:  [facility]   PHYSICIAN:  Raymon Mutton, P.A. DATE OF BIRTH:  [birth year]   DATE OF ADMISSION:  03/11/2007  DATE OF DISCHARGE:  03/12/2007                               DISCHARGE SUMMARY   DISCHARGE DIAGNOSES:  1. Peripheral vascular disease, status post right superficial femoral      artery percutaneous transluminal angioplasty for proximal stent      restenosis.  2. Hypertension.  3. Hyperlipidemia.  4. Migraine headache.  5. Ongoing tobacco use.   HISTORY OF PRESENT ILLNESS, HOSPITAL COURSE, AND PROCEDURE:  This is a  60 year old female patient of Dr. Allyson Sabal with previous known history of  peripheral vascular disease, who was seen in our office on February 24, 2007.  The patient complained of lower extremity claudication and  underwent Doppler of the lower extremity with the recent result being  abnormal showing with ABI on the right was 0.78 and on the left 0.66.  The patient was scheduled for a peripheral angiography and for that she  was admitted to the hospital on March 11, 2007.   HOSPITAL PROCEDURES:  Peripheral angiography performed by Dr. Allyson Sabal,  showed normal renal arteries and reveals mild to moderate  atherosclerotic changes on the abdominal aorta.  The left lower  extremity revealed well patent left common iliac and external iliac  stent.  Need no self-expanding stents and occluded SFA.  The right lower  extremity showed widely patent diffusely stented right SFA with focal  90% proximal stent restenosis and diffuse 30% to 40% in-stent restenosis  with 3-vessel runoff.  Dr. Allyson Sabal performed angioplasty with a Powerflex  balloon with reduction of the focal stenosis from 90% to 0%.  The  patient tolerated the procedure well.  The next morning was seen by Dr.  Allyson Sabal.  Her groin did not reveal any signs  of hematoma or porosis.  Vital signs were stable and she was discharged home in stable condition.   DISCHARGE MEDICATIONS:  1. Coreg CR 40 mg daily.  2. Skelaxin 800 mg p.r.n.  3. Lipitor 80 mg daily.  4. Omeprazole 20 mg daily.  5. TriCor 145 mg daily.  6. Aspirin 325 mg daily.  7. Relpax 20 mg p.r.n.  8. Diovan 80 mg daily.   DISCHARGE FOLLOWUP:  She will be scheduled for right lower extremity  ultrasound next week and in follow up will see Dr. Allyson Sabal in our office  next week.  She was not allowed to lift any weights or drive for three  days postprocedure.  The patient was advised to continue with a low-fat,  low-cholesterol diet.      Raymon Mutton, P.A.     MK/MEDQ  D:  03/12/2007  T:  03/12/2007  Job:  [id number]

## 2013-02-13 ENCOUNTER — Other Ambulatory Visit (HOSPITAL_COMMUNITY): Payer: Self-pay | Admitting: Cardiovascular Disease

## 2013-02-13 DIAGNOSIS — I70219 Atherosclerosis of native arteries of extremities with intermittent claudication, unspecified extremity: Secondary | ICD-10-CM

## 2013-03-07 ENCOUNTER — Encounter (HOSPITAL_COMMUNITY): Payer: Self-pay

## 2013-03-07 ENCOUNTER — Ambulatory Visit (HOSPITAL_COMMUNITY)
Admission: RE | Admit: 2013-03-07 | Discharge: 2013-03-07 | Disposition: A | Discharge status: Home / Self Care | Payer: PRIVATE HEALTH INSURANCE | Source: Ambulatory Visit | Attending: Cardiovascular Disease | Admitting: Cardiovascular Disease

## 2013-03-07 DIAGNOSIS — I779 Disorder of arteries and arterioles, unspecified: Secondary | ICD-10-CM | POA: Insufficient documentation

## 2013-03-07 NOTE — Progress Notes (Signed)
Carotid Duplex Completed. [name]  

## 2013-03-20 ENCOUNTER — Ambulatory Visit (HOSPITAL_COMMUNITY)
Admission: RE | Admit: 2013-03-20 | Discharge: 2013-03-20 | Disposition: A | Discharge status: Home / Self Care | Payer: PRIVATE HEALTH INSURANCE | Source: Ambulatory Visit | Attending: Cardiovascular Disease | Admitting: Cardiovascular Disease

## 2013-03-20 DIAGNOSIS — I70219 Atherosclerosis of native arteries of extremities with intermittent claudication, unspecified extremity: Secondary | ICD-10-CM | POA: Insufficient documentation

## 2013-03-20 NOTE — Progress Notes (Signed)
Venous duplex ultrasound was completed of the lower extremities. Larene Pickett RVT

## 2013-03-20 NOTE — Progress Notes (Signed)
Arterial Duplex Lower Ext. Completed. [name] D  

## 2013-04-26 ENCOUNTER — Emergency Department (HOSPITAL_COMMUNITY): Payer: PRIVATE HEALTH INSURANCE

## 2013-04-26 ENCOUNTER — Inpatient Hospital Stay (HOSPITAL_COMMUNITY)
Admission: EM | Admit: 2013-04-26 | Discharge: 2013-04-28 | DRG: 282 | Disposition: A | Discharge status: Home / Self Care | Payer: PRIVATE HEALTH INSURANCE | Attending: Cardiology | Admitting: Cardiology

## 2013-04-26 ENCOUNTER — Encounter (HOSPITAL_COMMUNITY): Payer: Self-pay | Admitting: Emergency Medicine

## 2013-04-26 DIAGNOSIS — Z800 Family history of malignant neoplasm of digestive organs: Secondary | ICD-10-CM

## 2013-04-26 DIAGNOSIS — I2510 Atherosclerotic heart disease of native coronary artery without angina pectoris: Secondary | ICD-10-CM | POA: Diagnosis present

## 2013-04-26 DIAGNOSIS — I10 Essential (primary) hypertension: Secondary | ICD-10-CM | POA: Diagnosis present

## 2013-04-26 DIAGNOSIS — Z8673 Personal history of transient ischemic attack (TIA), and cerebral infarction without residual deficits: Secondary | ICD-10-CM

## 2013-04-26 DIAGNOSIS — E785 Hyperlipidemia, unspecified: Secondary | ICD-10-CM | POA: Insufficient documentation

## 2013-04-26 DIAGNOSIS — Z7901 Long term (current) use of anticoagulants: Secondary | ICD-10-CM

## 2013-04-26 DIAGNOSIS — E782 Mixed hyperlipidemia: Secondary | ICD-10-CM | POA: Diagnosis present

## 2013-04-26 DIAGNOSIS — I2584 Coronary atherosclerosis due to calcified coronary lesion: Secondary | ICD-10-CM | POA: Diagnosis present

## 2013-04-26 DIAGNOSIS — Z9861 Coronary angioplasty status: Secondary | ICD-10-CM

## 2013-04-26 DIAGNOSIS — I4891 Unspecified atrial fibrillation: Secondary | ICD-10-CM | POA: Diagnosis present

## 2013-04-26 DIAGNOSIS — J811 Chronic pulmonary edema: Secondary | ICD-10-CM

## 2013-04-26 DIAGNOSIS — I70209 Unspecified atherosclerosis of native arteries of extremities, unspecified extremity: Secondary | ICD-10-CM | POA: Diagnosis present

## 2013-04-26 DIAGNOSIS — I214 Non-ST elevation (NSTEMI) myocardial infarction: Principal | ICD-10-CM | POA: Diagnosis present

## 2013-04-26 DIAGNOSIS — Z880 Allergy status to penicillin: Secondary | ICD-10-CM

## 2013-04-26 DIAGNOSIS — Z801 Family history of malignant neoplasm of trachea, bronchus and lung: Secondary | ICD-10-CM

## 2013-04-26 DIAGNOSIS — Z8249 Family history of ischemic heart disease and other diseases of the circulatory system: Secondary | ICD-10-CM

## 2013-04-26 DIAGNOSIS — D72829 Elevated white blood cell count, unspecified: Secondary | ICD-10-CM | POA: Diagnosis present

## 2013-04-26 DIAGNOSIS — Z79899 Other long term (current) drug therapy: Secondary | ICD-10-CM

## 2013-04-26 DIAGNOSIS — Z7982 Long term (current) use of aspirin: Secondary | ICD-10-CM

## 2013-04-26 DIAGNOSIS — Z888 Allergy status to other drugs, medicaments and biological substances status: Secondary | ICD-10-CM

## 2013-04-26 DIAGNOSIS — I739 Peripheral vascular disease, unspecified: Secondary | ICD-10-CM

## 2013-04-26 DIAGNOSIS — I059 Rheumatic mitral valve disease, unspecified: Secondary | ICD-10-CM | POA: Diagnosis present

## 2013-04-26 DIAGNOSIS — Z87891 Personal history of nicotine dependence: Secondary | ICD-10-CM

## 2013-04-26 HISTORY — DX: Essential (primary) hypertension: I10

## 2013-04-26 HISTORY — DX: Personal history of other medical treatment: Z92.89

## 2013-04-26 HISTORY — DX: Peripheral vascular disease, unspecified: I73.9

## 2013-04-26 HISTORY — DX: Paroxysmal atrial fibrillation: I48.0

## 2013-04-26 HISTORY — DX: Non-ST elevation (NSTEMI) myocardial infarction: I21.4

## 2013-04-26 HISTORY — DX: Cerebral infarction, unspecified: I63.9

## 2013-04-26 LAB — CBC
MCH: 30.5 pg (ref 26.0–34.0)
Platelets: 397 10*3/uL (ref 150–400)
RBC: 4 MIL/uL (ref 3.87–5.11)
RDW: 14.6 % (ref 11.5–15.5)
WBC: 13.3 10*3/uL — ABNORMAL HIGH (ref 4.0–10.5)

## 2013-04-26 IMAGING — CR DG CHEST 1V PORT
1 series · 1 of 1 positions shown · non-contrast
Comparison: Chest radiograph [DATE]

CLINICAL DATA: Chest pain, short of breath

PORTABLE CHEST - 1 VIEW

[AP]
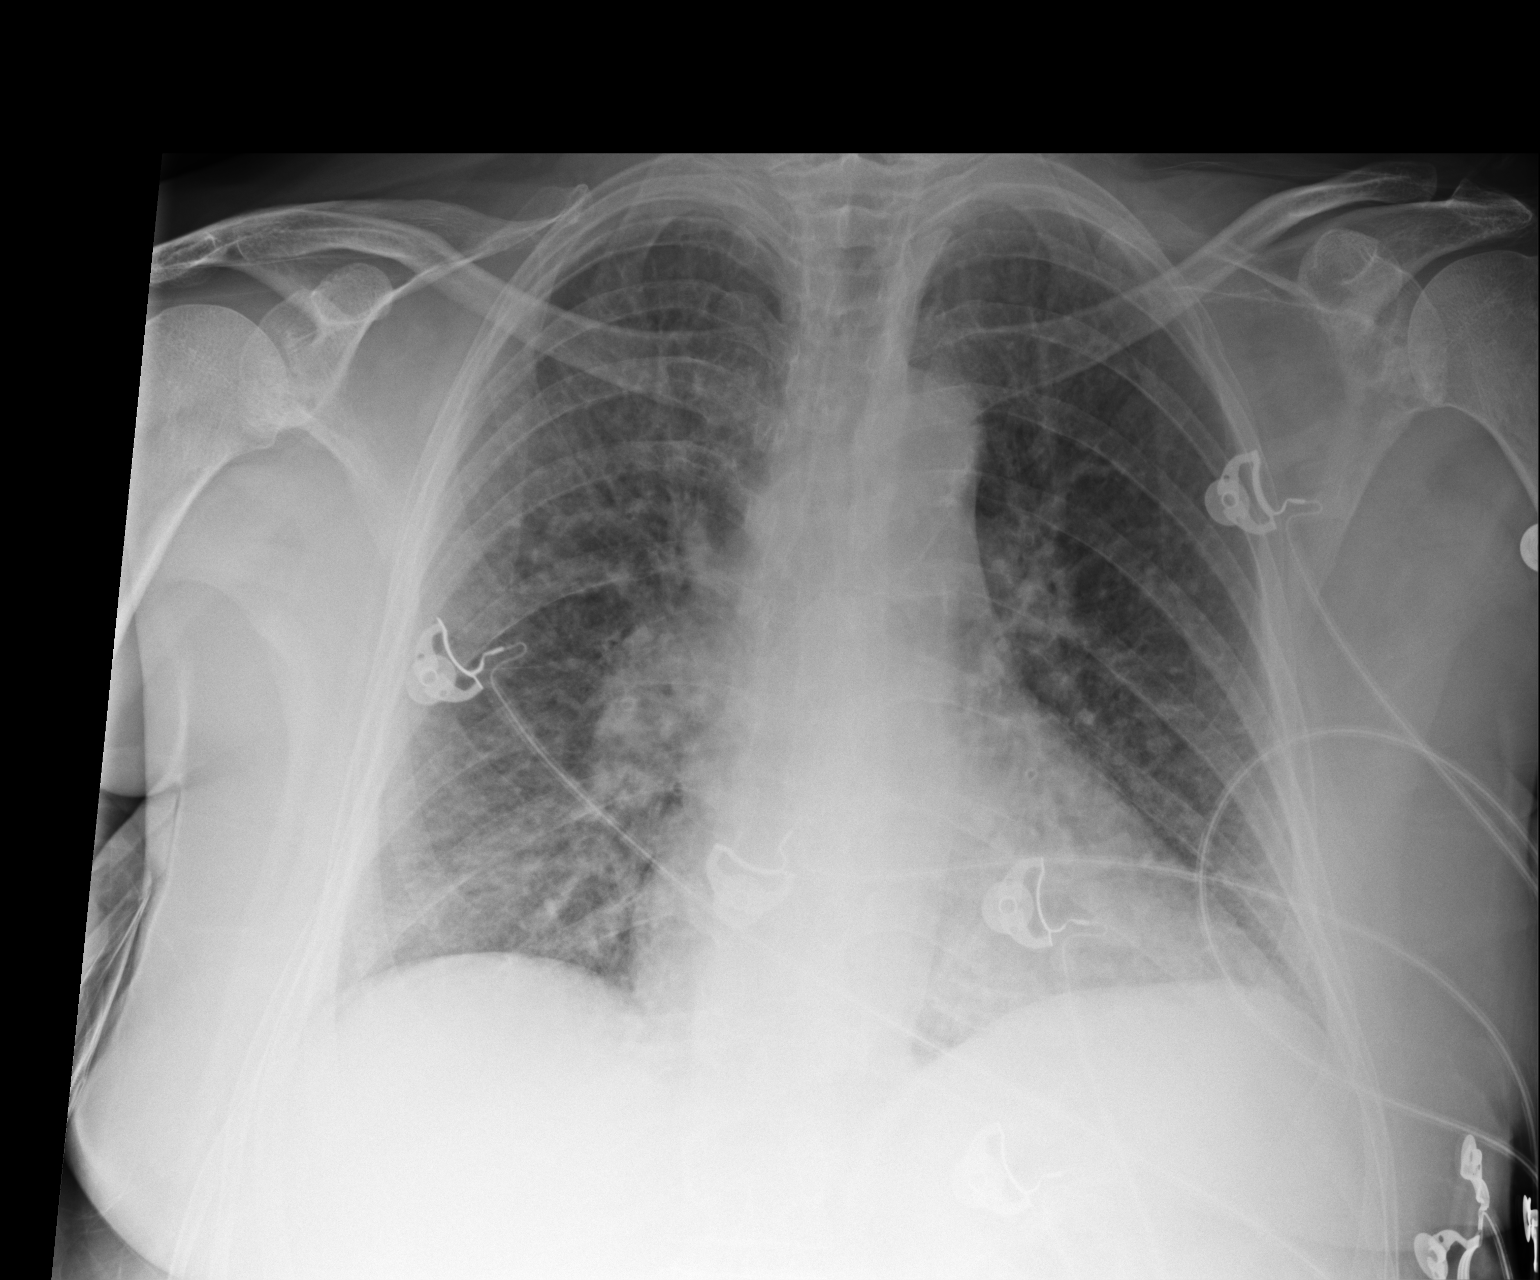

[1 of 1 positions shown; findings below may reference images not displayed]

FINDINGS: Stable cardiac silhouette.  There is increased fine
nodular air space pattern within the left and right lung.  No
pleural fluid.  No pneumothorax.
IMPRESSION: New bilateral nodular air space disease suggest pulmonary edema
versus diffuse pulmonary infection.  Recommend follow-up chest
radiograph and if no improvement consider CT.

## 2013-04-26 MED ORDER — DILTIAZEM HCL 100 MG IV SOLR
5.0000 mg/h | INTRAVENOUS | Status: DC
  Administered 2013-04-26: 5 mg/h via INTRAVENOUS

## 2013-04-26 MED ORDER — NITROGLYCERIN 0.4 MG SL SUBL
SUBLINGUAL_TABLET | SUBLINGUAL | Status: AC
  Filled 2013-04-26: qty 75

## 2013-04-26 MED ORDER — ONDANSETRON HCL 4 MG/2ML IJ SOLN: 4.0000 mg | Freq: Once | INTRAMUSCULAR | Status: AC

## 2013-04-26 MED ORDER — ONDANSETRON HCL 4 MG/2ML IJ SOLN
INTRAMUSCULAR | Status: AC
  Administered 2013-04-26: 4 mg via INTRAVENOUS
  Filled 2013-04-26: qty 2

## 2013-04-26 MED ORDER — DILTIAZEM LOAD VIA INFUSION
10.0000 mg | Freq: Once | INTRAVENOUS | Status: AC
  Administered 2013-04-26: 10 mg via INTRAVENOUS
  Filled 2013-04-26: qty 10

## 2013-04-26 NOTE — ED Notes (Signed)
BIB [employer]. From home, pt c/o CP (substernal central, non-radiating). Lightheaded, dizzy from earlier this evening. Hx of Afib. EMS: Afib 130's, ASA 324mg  104/70 18RR 98%ra

## 2013-04-26 NOTE — ED Provider Notes (Signed)
History     CSN: [id number]  Arrival date & time 04/26/13  2301   First MD Initiated Contact with Patient 04/26/13 2326      Chief Complaint  Patient presents with  . Chest Pain     Patient is a 62 y.o. female presenting with chest pain. The history is provided by the patient.  Chest Pain Pain location:  Substernal area Pain quality comment:  Squeezing Pain radiates to:  Does not radiate Pain severity:  Moderate Onset quality:  Gradual Duration:  2 hours Timing:  Constant Progression:  Unchanged Chronicity:  New Relieved by:  Nothing Associated symptoms: diaphoresis, dizziness, fatigue, nausea, near-syncope, vomiting and weakness   Associated symptoms: no abdominal pain, no shortness of breath and no syncope   pt reports she was in her usual state of health earlier today when this evening she noted generalized weakness, diaphoretic and nausea.  She then reports she had chest "squeezing" She reports h/o afib and is always in afib.  She takes ASA for her afib She denies any cp or similar episodes recently  Past Medical History  Diagnosis Date  . Stroke 2006  . Hypertension   . Afib     History reviewed. No pertinent past surgical history.  History reviewed. No pertinent family history.  History  Substance Use Topics  . Smoking status: Not on file  . Smokeless tobacco: Not on file  . Alcohol Use: No    OB History   Grav Para Term Preterm Abortions TAB SAB Ect Mult Living                  Review of Systems  Constitutional: Positive for diaphoresis and fatigue.  Respiratory: Negative for shortness of breath.   Cardiovascular: Positive for chest pain and near-syncope. Negative for syncope.  Gastrointestinal: Positive for nausea and vomiting. Negative for abdominal pain.  Neurological: Positive for dizziness and weakness. Negative for syncope.  All other systems reviewed and are negative.    Allergies  Atenolol; Crestor; Demerol; Effexor; Inderal; Lovaza;  Penicillins; Percocet; Plavix; Warfarin and related; Simvastatin; and Trilipix  Home Medications   Current Outpatient Rx  Name  Route  Sig  Dispense  Refill  . acetaminophen (TYLENOL) 500 MG tablet   Oral   Take 1,000 mg by mouth every 6 (six) hours as needed for pain.         Marland Kitchen aspirin EC 81 MG tablet   Oral   Take 81 mg by mouth daily.         Marland Kitchen diltiazem (CARDIZEM CD) 180 MG 24 hr capsule   Oral   Take 180 mg by mouth every morning.         . ezetimibe (ZETIA) 10 MG tablet   Oral   Take 10 mg by mouth at bedtime.         . fenofibrate (TRICOR) 145 MG tablet   Oral   Take 145 mg by mouth at bedtime.         Marland Kitchen lisinopril (PRINIVIL,ZESTRIL) 10 MG tablet   Oral   Take 5 mg by mouth every morning.         Marland Kitchen omeprazole (PRILOSEC) 20 MG capsule   Oral   Take 20 mg by mouth 2 (two) times daily.         . polyethylene glycol (MIRALAX / GLYCOLAX) packet   Oral   Take 17 g by mouth daily as needed (constipation).         Marland Kitchen  pravastatin (PRAVACHOL) 40 MG tablet   Oral   Take 40 mg by mouth at bedtime.         . simethicone (MYLICON) 125 MG chewable tablet   Oral   Chew 125 mg by mouth every 6 (six) hours as needed for flatulence.         . traMADol (ULTRAM) 50 MG tablet   Oral   Take 50 mg by mouth every 6 (six) hours as needed for pain.           BP 124/90  Pulse 137  Temp(Src) 97.5 F (36.4 C)  Resp 17  Ht 5' 5.5" (1.664 m)  Wt 154 lb (69.854 kg)  BMI 25.23 kg/m2  SpO2 97%  Physical Exam CONSTITUTIONAL: Well developed/well nourished, ill appearing HEAD: Normocephalic/atraumatic EYES: EOMI/PERRL ENMT: Mucous membranes moist NECK: supple no meningeal signs SPINE:entire spine nontender ZO:[id number], irregular, no murmurs/rubs/gallops noted LUNGS: crackles noted in each base, no apparent distress ABDOMEN: soft, nontender, no rebound or guarding GU:no cva tenderness NEURO: Pt is awake/alert, moves all extremitiesx4 EXTREMITIES:  pulses normal, full ROM, no LE edema noted SKIN: warm, color normal PSYCH: no abnormalities of mood noted  ED Course  Procedures  CRITICAL CARE Performed by: Joya Gaskins Total critical care time: 34 Critical care time was exclusive of separately billable procedures and treating other patients. Critical care was necessary to treat or prevent imminent or life-threatening deterioration. Critical care was time spent personally by me on the following activities: development of treatment plan with patient and/or surrogate as well as nursing, discussions with consultants, evaluation of patient's response to treatment, examination of patient, obtaining history from patient or surrogate, ordering and performing treatments and interventions, ordering and review of laboratory studies, ordering and review of radiographic studies, pulse oximetry and re-evaluation of patient's condition.   Labs Reviewed  CBC  BASIC METABOLIC PANEL   11:91 PM Pt with afib with RVR, chest pressure and she is ill appearing.  Will start with cardizem for rate control given HR >120 She has had ASA today Will follow closely 12:24 AM Hr improving, but SBP is now dropping from cardizem IV fluids ordered Pt is awake/alert, and reports she feels improved Labs currently pending 1:01 AM Pt now improved.  She reports CP is resolved She does not appear dyspneic Pt has been stabilized in the ED Will call for admission.  Her cardiologist are [facility] 1:08 AM D/w [facility], will admit to stepdown  MDM  Nursing notes including past medical history and social history reviewed and considered in documentation xrays reviewed and considered Labs/vital reviewed and considered        Date: 04/26/2013  Rate: 127  Rhythm: atrial fibrillation  QRS Axis: right  Intervals: normal  ST/T Wave abnormalities: ST depressions diffusely  Conduction Disutrbances:none  Narrative Interpretation:   Old EKG Reviewed: none  available at time of interpretation    Joya Gaskins, MD 04/27/13 [phone]

## 2013-04-26 NOTE — ED Notes (Signed)
MD declined nitro.

## 2013-04-27 ENCOUNTER — Encounter (HOSPITAL_COMMUNITY): Payer: Self-pay | Admitting: Internal Medicine

## 2013-04-27 ENCOUNTER — Encounter (HOSPITAL_COMMUNITY)
Admission: EM | Disposition: A | Discharge status: Home / Self Care | Payer: Self-pay | Source: Home / Self Care | Attending: Cardiology

## 2013-04-27 DIAGNOSIS — I739 Peripheral vascular disease, unspecified: Secondary | ICD-10-CM

## 2013-04-27 DIAGNOSIS — E782 Mixed hyperlipidemia: Secondary | ICD-10-CM | POA: Diagnosis present

## 2013-04-27 DIAGNOSIS — I214 Non-ST elevation (NSTEMI) myocardial infarction: Secondary | ICD-10-CM | POA: Diagnosis present

## 2013-04-27 DIAGNOSIS — E785 Hyperlipidemia, unspecified: Secondary | ICD-10-CM | POA: Insufficient documentation

## 2013-04-27 DIAGNOSIS — D72829 Elevated white blood cell count, unspecified: Secondary | ICD-10-CM

## 2013-04-27 DIAGNOSIS — I4891 Unspecified atrial fibrillation: Secondary | ICD-10-CM | POA: Diagnosis present

## 2013-04-27 DIAGNOSIS — Z8673 Personal history of transient ischemic attack (TIA), and cerebral infarction without residual deficits: Secondary | ICD-10-CM

## 2013-04-27 DIAGNOSIS — Z9889 Other specified postprocedural states: Secondary | ICD-10-CM

## 2013-04-27 HISTORY — PX: LEFT HEART CATHETERIZATION WITH CORONARY ANGIOGRAM: SHX5451

## 2013-04-27 HISTORY — DX: Other specified postprocedural states: Z98.890

## 2013-04-27 HISTORY — DX: Unspecified atrial fibrillation: I48.91

## 2013-04-27 HISTORY — PX: CARDIAC CATHETERIZATION: SHX172

## 2013-04-27 HISTORY — DX: Non-ST elevation (NSTEMI) myocardial infarction: I21.4

## 2013-04-27 LAB — POCT I-STAT TROPONIN I: Troponin i, poc: 0.01 ng/mL (ref 0.00–0.08)

## 2013-04-27 LAB — CBC WITH DIFFERENTIAL/PLATELET
Basophils Relative: 1 % (ref 0–1)
Eosinophils Absolute: 0.3 10*3/uL (ref 0.0–0.7)
MCH: 30.8 pg (ref 26.0–34.0)
MCHC: 35.1 g/dL (ref 30.0–36.0)
Neutro Abs: 13.1 10*3/uL — ABNORMAL HIGH (ref 1.7–7.7)
Neutrophils Relative %: 85 % — ABNORMAL HIGH (ref 43–77)
Platelets: 369 10*3/uL (ref 150–400)
RBC: 4.06 MIL/uL (ref 3.87–5.11)

## 2013-04-27 LAB — CBC
HCT: 35.3 % — ABNORMAL LOW (ref 36.0–46.0)
Hemoglobin: 12.1 g/dL (ref 12.0–15.0)
MCHC: 34.3 g/dL (ref 30.0–36.0)
MCV: 90.5 fL (ref 78.0–100.0)
WBC: 10 10*3/uL (ref 4.0–10.5)

## 2013-04-27 LAB — POCT I-STAT, CHEM 8
Creatinine, Ser: 1 mg/dL (ref 0.50–1.10)
Hemoglobin: 12.2 g/dL (ref 12.0–15.0)
Potassium: 3.6 mEq/L (ref 3.5–5.1)
Sodium: 139 mEq/L (ref 135–145)
TCO2: 27 mmol/L (ref 0–100)

## 2013-04-27 LAB — COMPREHENSIVE METABOLIC PANEL
ALT: 32 U/L (ref 0–35)
AST: 51 U/L — ABNORMAL HIGH (ref 0–37)
CO2: 22 mEq/L (ref 19–32)
Calcium: 8.6 mg/dL (ref 8.4–10.5)
Chloride: 101 mEq/L (ref 96–112)
Creatinine, Ser: 1 mg/dL (ref 0.50–1.10)
GFR calc Af Amer: 69 mL/min — ABNORMAL LOW (ref >90)
GFR calc non Af Amer: 60 mL/min — ABNORMAL LOW (ref >90)
Glucose, Bld: 100 mg/dL — ABNORMAL HIGH (ref 70–99)
Total Bilirubin: 0.2 mg/dL — ABNORMAL LOW (ref 0.3–1.2)

## 2013-04-27 LAB — BASIC METABOLIC PANEL
CO2: 25 mEq/L (ref 19–32)
Calcium: 8.6 mg/dL (ref 8.4–10.5)
Chloride: 100 mEq/L (ref 96–112)
GFR calc Af Amer: 69 mL/min — ABNORMAL LOW (ref >90)
Sodium: 137 mEq/L (ref 135–145)

## 2013-04-27 LAB — PROTIME-INR
INR: 1.02 (ref 0.00–1.49)
Prothrombin Time: 13.3 seconds (ref 11.6–15.2)

## 2013-04-27 LAB — CREATININE, SERUM
GFR calc Af Amer: 66 mL/min — ABNORMAL LOW (ref >90)
GFR calc non Af Amer: 57 mL/min — ABNORMAL LOW (ref >90)

## 2013-04-27 LAB — TROPONIN I: Troponin I: 0.92 ng/mL (ref <0.30)

## 2013-04-27 LAB — MAGNESIUM: Magnesium: 1.5 mg/dL (ref 1.5–2.5)

## 2013-04-27 SURGERY — LEFT HEART CATHETERIZATION WITH CORONARY ANGIOGRAM
Laterality: Right

## 2013-04-27 MED ORDER — ASPIRIN 81 MG PO CHEW: 324.0000 mg | CHEWABLE_TABLET | ORAL | Status: DC

## 2013-04-27 MED ORDER — SODIUM CHLORIDE 0.9 % IJ SOLN: 3.0000 mL | INTRAMUSCULAR | Status: DC | PRN

## 2013-04-27 MED ORDER — ASPIRIN EC 81 MG PO TBEC: 81.0000 mg | DELAYED_RELEASE_TABLET | Freq: Every day | ORAL | Status: DC

## 2013-04-27 MED ORDER — TRAMADOL HCL 50 MG PO TABS: 50.0000 mg | ORAL_TABLET | Freq: Four times a day (QID) | ORAL | Status: DC | PRN

## 2013-04-27 MED ORDER — HEPARIN SODIUM (PORCINE) 1000 UNIT/ML IJ SOLN
INTRAMUSCULAR | Status: AC
  Filled 2013-04-27: qty 1

## 2013-04-27 MED ORDER — LIDOCAINE HCL (PF) 1 % IJ SOLN
INTRAMUSCULAR | Status: AC
  Filled 2013-04-27: qty 30

## 2013-04-27 MED ORDER — SODIUM CHLORIDE 0.9 % IV SOLN: 1.0000 mL/kg/h | INTRAVENOUS | Status: AC

## 2013-04-27 MED ORDER — ASPIRIN 81 MG PO CHEW
324.0000 mg | CHEWABLE_TABLET | ORAL | Status: AC
  Administered 2013-04-27: 324 mg via ORAL
  Filled 2013-04-27: qty 3

## 2013-04-27 MED ORDER — SODIUM CHLORIDE 0.9 % IV BOLUS (SEPSIS)
250.0000 mL | Freq: Once | INTRAVENOUS | Status: AC
  Administered 2013-04-27: 250 mL via INTRAVENOUS

## 2013-04-27 MED ORDER — ATORVASTATIN CALCIUM 10 MG PO TABS
10.0000 mg | ORAL_TABLET | Freq: Every day | ORAL | Status: DC
  Filled 2013-04-27 (×2): qty 1

## 2013-04-27 MED ORDER — VERAPAMIL HCL 2.5 MG/ML IV SOLN
INTRAVENOUS | Status: AC
  Filled 2013-04-27: qty 2

## 2013-04-27 MED ORDER — NITROGLYCERIN 1 MG/10 ML FOR IR/CATH LAB
INTRA_ARTERIAL | Status: AC
  Filled 2013-04-27: qty 10

## 2013-04-27 MED ORDER — PANTOPRAZOLE SODIUM 40 MG PO TBEC
40.0000 mg | DELAYED_RELEASE_TABLET | Freq: Every day | ORAL | Status: DC
  Administered 2013-04-27 – 2013-04-28 (×2): 40 mg via ORAL
  Filled 2013-04-27 (×2): qty 1

## 2013-04-27 MED ORDER — HEPARIN (PORCINE) IN NACL 2-0.9 UNIT/ML-% IJ SOLN
INTRAMUSCULAR | Status: AC
  Filled 2013-04-27: qty 1000

## 2013-04-27 MED ORDER — SIMVASTATIN 20 MG PO TABS
20.0000 mg | ORAL_TABLET | Freq: Every day | ORAL | Status: DC
  Filled 2013-04-27: qty 1

## 2013-04-27 MED ORDER — LEVOFLOXACIN IN D5W 750 MG/150ML IV SOLN
750.0000 mg | Freq: Every day | INTRAVENOUS | Status: DC
  Administered 2013-04-27 (×2): 750 mg via INTRAVENOUS
  Filled 2013-04-27 (×3): qty 150

## 2013-04-27 MED ORDER — SODIUM CHLORIDE 0.9 % IJ SOLN
3.0000 mL | Freq: Two times a day (BID) | INTRAMUSCULAR | Status: DC
  Administered 2013-04-27: 3 mL via INTRAVENOUS

## 2013-04-27 MED ORDER — ACETAMINOPHEN 650 MG RE SUPP: 650.0000 mg | Freq: Four times a day (QID) | RECTAL | Status: DC | PRN

## 2013-04-27 MED ORDER — ASPIRIN 81 MG PO CHEW
CHEWABLE_TABLET | ORAL | Status: AC
  Administered 2013-04-27: 81 mg
  Filled 2013-04-27: qty 1

## 2013-04-27 MED ORDER — ONDANSETRON HCL 4 MG PO TABS: 4.0000 mg | ORAL_TABLET | Freq: Four times a day (QID) | ORAL | Status: DC | PRN

## 2013-04-27 MED ORDER — SODIUM CHLORIDE 0.9 % IV SOLN: 250.0000 mL | INTRAVENOUS | Status: DC

## 2013-04-27 MED ORDER — FENOFIBRATE 160 MG PO TABS
160.0000 mg | ORAL_TABLET | Freq: Every day | ORAL | Status: DC
  Administered 2013-04-27 – 2013-04-28 (×2): 160 mg via ORAL
  Filled 2013-04-27 (×2): qty 1

## 2013-04-27 MED ORDER — DILTIAZEM HCL ER COATED BEADS 180 MG PO CP24
180.0000 mg | ORAL_CAPSULE | Freq: Every morning | ORAL | Status: DC
  Administered 2013-04-27 – 2013-04-28 (×2): 180 mg via ORAL
  Filled 2013-04-27 (×2): qty 1

## 2013-04-27 MED ORDER — MIDAZOLAM HCL 2 MG/2ML IJ SOLN
INTRAMUSCULAR | Status: AC
  Filled 2013-04-27: qty 2

## 2013-04-27 MED ORDER — EZETIMIBE 10 MG PO TABS
10.0000 mg | ORAL_TABLET | Freq: Every day | ORAL | Status: DC
  Administered 2013-04-27: 10 mg via ORAL
  Filled 2013-04-27 (×2): qty 1

## 2013-04-27 MED ORDER — DIAZEPAM 5 MG PO TABS
5.0000 mg | ORAL_TABLET | ORAL | Status: AC
  Administered 2013-04-27: 5 mg via ORAL
  Filled 2013-04-27: qty 1

## 2013-04-27 MED ORDER — ONDANSETRON HCL 4 MG/2ML IJ SOLN: 4.0000 mg | Freq: Four times a day (QID) | INTRAMUSCULAR | Status: DC | PRN

## 2013-04-27 MED ORDER — LISINOPRIL 5 MG PO TABS
5.0000 mg | ORAL_TABLET | Freq: Every morning | ORAL | Status: DC
  Administered 2013-04-27 – 2013-04-28 (×2): 5 mg via ORAL
  Filled 2013-04-27 (×2): qty 1

## 2013-04-27 MED ORDER — SIMETHICONE 80 MG PO CHEW
125.0000 mg | CHEWABLE_TABLET | Freq: Four times a day (QID) | ORAL | Status: DC | PRN
Start: 2013-04-27 — End: 2013-04-28
  Filled 2013-04-27: qty 2

## 2013-04-27 MED ORDER — SODIUM CHLORIDE 0.9 % IJ SOLN: 3.0000 mL | Freq: Two times a day (BID) | INTRAMUSCULAR | Status: DC

## 2013-04-27 MED ORDER — POLYETHYLENE GLYCOL 3350 17 G PO PACK
17.0000 g | PACK | Freq: Every day | ORAL | Status: DC | PRN
  Filled 2013-04-27: qty 1

## 2013-04-27 MED ORDER — ENOXAPARIN SODIUM 40 MG/0.4ML ~~LOC~~ SOLN
40.0000 mg | Freq: Every day | SUBCUTANEOUS | Status: DC
  Filled 2013-04-27: qty 0.4

## 2013-04-27 MED ORDER — SODIUM CHLORIDE 0.9 % IV SOLN
INTRAVENOUS | Status: DC
  Administered 2013-04-27: 10 mL via INTRAVENOUS

## 2013-04-27 MED ORDER — ASPIRIN EC 81 MG PO TBEC
81.0000 mg | DELAYED_RELEASE_TABLET | Freq: Every day | ORAL | Status: DC
  Administered 2013-04-28: 81 mg via ORAL
  Filled 2013-04-27 (×2): qty 1

## 2013-04-27 MED ORDER — ENOXAPARIN SODIUM 40 MG/0.4ML ~~LOC~~ SOLN
40.0000 mg | SUBCUTANEOUS | Status: DC
  Filled 2013-04-27 (×2): qty 0.4

## 2013-04-27 MED ORDER — FENTANYL CITRATE 0.05 MG/ML IJ SOLN
INTRAMUSCULAR | Status: AC
  Filled 2013-04-27: qty 2

## 2013-04-27 MED ORDER — ACETAMINOPHEN 325 MG PO TABS: 650.0000 mg | ORAL_TABLET | Freq: Four times a day (QID) | ORAL | Status: DC | PRN

## 2013-04-27 NOTE — H&P (Signed)
[facility] History and Physical  [name] UJW:[id number] DOB: 07-Mar-1951 DOA: 04/26/2013  Referring physician: Dr. Bebe Shaggy. PCP: No primary provider on file. At [place]. Specialists: [facility].  Chief Complaint: Chest pain and palpitations.  HPI: [name] is a 61 y.o. female with known history of atrial fibrillation, hypertension, peripheral arterial disease status post stenting, previous history of cigarette smoking, history of CVA was brought to the ER patient has been having palpitations and chest pressure. Patient states that last evening after working in the yard patient started feeling funny and checked her blood pressure and she found it was low. Later she started developing chest pressure and palpitations and felt nauseated and threw up once. She also became diaphoretic. EMS was called and the patient was brought to the ER. In the ER EKG showed A. fib with RVR and patient was initially given Cardizem bolus after which patient's blood pressure dropped and was given IV fluids Cardizem was discontinued. Patient's heart rate improved and by the time I saw patient is in sinus rhythm with blood pressure in the 110 systolic. Patient presently chest pain-free. Denies any shortness of breath. Patient did not have any abdominal pain or diarrhea. Patient states she has been having some nonproductive cough for last one month. EKG showed A. fib with RVR and ST depressions. Chest x-ray shows congestion with possibility of infiltrates. Blood work showed mild leukocytosis.  Review of Systems: As presented in the history of presenting illness, rest negative.  Past Medical History  Diagnosis Date  . Stroke 2006  . Hypertension   . Afib    Past Surgical History  Procedure Laterality Date  . Stent pad    . Ovarian cyst removal     Social History:  reports that she has quit smoking. She does not have any smokeless tobacco history on file. She reports  that she does not drink alcohol or use illicit drugs. Lives at home. where does patient live-- Can do ADLs. Can patient participate in ADLs?  Allergies  Allergen Reactions  . Atenolol   . Crestor (Rosuvastatin) Other (See Comments)    Itchy rash  . Demerol (Meperidine) Other (See Comments)    Unknown, can't remember   . Effexor (Venlafaxine Hcl)   . Inderal (Propranolol)   . Lovaza (Omega-3-Acid Ethyl Esters)   . Penicillins Other (See Comments)    Questionable high fever when taken and broke out in whelps.   . Percocet (Oxycodone-Acetaminophen)   . Plavix (Clopidogrel Bisulfate)   . Warfarin And Related Other (See Comments)    Caused nose bleeds  . Simvastatin Itching and Rash  . Trilipix (Choline Fenofibrate) Other (See Comments)    Family History  Problem Relation Age of Onset  . CAD Mother   . Lung cancer Mother   . CAD Father       Prior to Admission medications   Medication Sig Start Date End Date Taking? Authorizing Provider  acetaminophen (TYLENOL) 500 MG tablet Take 1,000 mg by mouth every 6 (six) hours as needed for pain.   Yes Historical Provider, MD  aspirin EC 81 MG tablet Take 81 mg by mouth daily.   Yes Historical Provider, MD  diltiazem (CARDIZEM CD) 180 MG 24 hr capsule Take 180 mg by mouth every morning.   Yes Historical Provider, MD  ezetimibe (ZETIA) 10 MG tablet Take 10 mg by mouth at bedtime.   Yes Historical Provider, MD  fenofibrate (TRICOR) 145 MG tablet Take 145 mg  by mouth at bedtime.   Yes Historical Provider, MD  lisinopril (PRINIVIL,ZESTRIL) 10 MG tablet Take 5 mg by mouth every morning.   Yes Historical Provider, MD  omeprazole (PRILOSEC) 20 MG capsule Take 20 mg by mouth 2 (two) times daily.   Yes Historical Provider, MD  polyethylene glycol (MIRALAX / GLYCOLAX) packet Take 17 g by mouth daily as needed (constipation).   Yes Historical Provider, MD  pravastatin (PRAVACHOL) 40 MG tablet Take 40 mg by mouth at bedtime.   Yes Historical Provider,  MD  simethicone (MYLICON) 125 MG chewable tablet Chew 125 mg by mouth every 6 (six) hours as needed for flatulence.   Yes Historical Provider, MD  traMADol (ULTRAM) 50 MG tablet Take 50 mg by mouth every 6 (six) hours as needed for pain.   Yes Historical Provider, MD   Physical Exam: Filed Vitals:   04/26/13 2335 04/27/13 0015 04/27/13 0030 04/27/13 0145  BP: 122/83 82/48 99/55  103/54  Pulse: 125 125 71 74  Temp: 97.4 F (36.3 C)     TempSrc: Oral     Resp: 16 19 13 19   Height:      Weight:      SpO2: 97% 96% 92% 97%     General:  Well developed and nourished.  Eyes: Anicteric no pallor.  ENT: No discharge from the ears eyes nose or mouth.  Neck: No mass felt.  Cardiovascular: S1-S2 heard.  Respiratory: No rhonchi or crepitations.  Abdomen: Soft nontender bowel sounds present.  Skin: No rash.  Musculoskeletal: No edema.  Psychiatric: Appears normal.  Neurologic: Alert awake oriented to time place and person. Moves all extremities.  Labs on Admission:  Basic Metabolic Panel:  Recent Labs Lab 04/26/13 2320 04/27/13 0031  NA 137 139  K 3.8 3.6  CL 100 103  CO2 25  --   GLUCOSE 116* 113*  BUN 24* 28*  CREATININE 1.00 1.00  CALCIUM 8.6  --    Liver Function Tests: No results found for this basename: AST, ALT, ALKPHOS, BILITOT, PROT, ALBUMIN,  in the last 168 hours No results found for this basename: LIPASE, AMYLASE,  in the last 168 hours No results found for this basename: AMMONIA,  in the last 168 hours CBC:  Recent Labs Lab 04/26/13 2320 04/27/13 0031  WBC 13.3*  --   HGB 12.2 12.2  HCT 34.7* 36.0  MCV 86.8  --   PLT 397  --    Cardiac Enzymes: No results found for this basename: CKTOTAL, CKMB, CKMBINDEX, TROPONINI,  in the last 168 hours  BNP (last 3 results) No results found for this basename: PROBNP,  in the last 8760 hours CBG: No results found for this basename: GLUCAP,  in the last 168 hours  Radiological Exams on Admission: Dg  Chest Port 1 View  04/26/2013  *RADIOLOGY REPORT*  Clinical Data: Chest pain, short of breath  PORTABLE CHEST - 1 VIEW  Comparison: Chest radiograph 12/08/2009  Findings: Stable cardiac silhouette.  There is increased fine nodular air space pattern within the left and right lung.  No pleural fluid.  No pneumothorax.  IMPRESSION: New bilateral nodular air space disease suggest pulmonary edema versus diffuse pulmonary infection.  Recommend follow-up chest radiograph and if no improvement consider CT.   Original Report Authenticated By: Genevive Bi, M.D.     EKG: Independently reviewed. Atrial fibrillation with RVR and ST depression.  Assessment/Plan Principal Problem:   Atrial fibrillation with RVR Active Problems:   Leucocytosis  PAD (peripheral artery disease)   Hyperlipidemia   History of CVA (cerebrovascular accident)   1. A. fib with RVR - at this time patient is in sinus rhythm. Patient states she used to be on Coumadin and had discontinued after having nasal bleed. At this time patient is on aspirin and continue her oral Cardizem. Continue to monitor in telemetry. Check thyroid function test and cardiac markers. 2. Chest pain - patient is presently chest pain-free. Probably precipitated by rapid rate. Since patient's EKG showed ST depressions we will cycle cardiac markers. Continue aspirin. 3. Leukocytosis - given that patient has had nonproductive cough and presently chest x-ray showing possibility of infiltrates versus congestion I have placed patient on antibiotics for possible pneumonia. 4. Peripheral arterial disease -  presently asymptomatic. 5. Hyperlipidemia - continue present medications. 6. History of CVA.    Code Status: Full code.  Family Communication: None.  Disposition Plan: Admit to inpatient.    Kamyia Thomason N. [facility] Pager [phone].  If 7PM-7AM, please contact night-coverage [email or web address] Password [facility] 04/27/2013, 2:14 AM

## 2013-04-27 NOTE — Consult Note (Signed)
Reason for Consult: PAF, + troponins  Referring Physician: Dr. Lavera Guise  No primary provider on file.  Dr. Samuel Jester Primary Cardiologist:Dr. Francesca Jewett is an 62 y.o. female.    Chief Complaint:  Admitted earlier this AM with chest pain and palpatations   HPI: 62 y.o. female with known history of paroxysmal atrial fibrillation, hypertension, peripheral arterial disease status post stenting to lt external iliac and Rt SFA with known occluded Lt SFA, previous history of cigarette smoking, history of CVA was brought to the ER.  Patient had been having palpitations and chest pressure. Patient states that last evening after working in the yard she started feeling funny and checked her blood pressure and she found it was low. Later she started developing chest pressure and palpitations and felt nauseated and vomitted once. She also became diaphoretic. This was much worse than her previous episodes of PAF.  EMS was called and the patient was brought to the ER. In the ER EKG showed A. fib with RVR and patient was initially given Cardizem bolus after which patient's blood pressure dropped and was given IV fluids.  Cardizem was discontinued. Patient's heart rate improved found to be in sinus rhythm with blood pressure in the 110 systolic. Patient was chest pain-free on Hospitalist's . Denied any shortness of breath. Patient did not have any abdominal pain or diarrhea. Patient stated she had been having some nonproductive cough for last one month. EKG showed A. fib with RVR and ST depressions. Chest x-ray shows congestion with possibility of infiltrates. Blood work showed mild leukocytosis.    This AM with + troponins  0.40 and second 1.21.  EKG A fib with diffuse ischemia with RVR.  Will check in SR.  Previously pt had been on coumadin but developed nosebleeds, has been on asprin.  Past Medical History  Diagnosis Date  . Stroke 2006  . Hypertension   . PVD (peripheral vascular disease) with  claudication     previous L ext iliac stent and rt SFA stent, known occluded Lt SFA  . Paroxysmal atrial fibrillation     hx of a fib on anticoagulation  . H/O cardiovascular stress test 12/27/2009    negative for ischemia  . NSTEMI (non-ST elevated myocardial infarction) 04/27/2013    Past Surgical History  Procedure Laterality Date  . Ovarian cyst removal    . Iliac artery stent  07/2006    external iliac and Rt SFA stent    Family History  Problem Relation Age of Onset  . CAD Mother   . Lung cancer Mother   . Heart attack Mother   . CAD Father   . Heart disease Father   . Heart attack Father 48    died in hes 1's with heart disease  . Healthy Sister   . Liver cancer Brother   . Healthy Sister   . CAD Brother     has had CABG  . CAD Brother    Social History:  reports that she quit smoking about 5 months ago. She has never used smokeless tobacco. She reports that she does not drink alcohol or use illicit drugs.  Widowed, with 2 children and 5 grandchildren, she has one of the grandchildren with her, she is raising.  Allergies:  Allergies  Allergen Reactions  . Atenolol   . Crestor (Rosuvastatin) Other (See Comments)    Itchy rash  . Demerol (Meperidine) Other (See Comments)    Unknown, can't remember   . Effexor (  Venlafaxine Hcl)   . Inderal (Propranolol)   . Lovaza (Omega-3-Acid Ethyl Esters)   . Penicillins Other (See Comments)    Questionable high fever when taken and broke out in whelps.   . Percocet (Oxycodone-Acetaminophen)   . Plavix (Clopidogrel Bisulfate)   . Warfarin And Related Other (See Comments)    Caused nose bleeds  . Simvastatin Itching and Rash  . Trilipix (Choline Fenofibrate) Other (See Comments)    Medications Prior to Admission  Medication Sig Dispense Refill  . acetaminophen (TYLENOL) 500 MG tablet Take 1,000 mg by mouth every 6 (six) hours as needed for pain.      Marland Kitchen aspirin EC 81 MG tablet Take 81 mg by mouth daily.      Marland Kitchen diltiazem  (CARDIZEM CD) 180 MG 24 hr capsule Take 180 mg by mouth every morning.      . ezetimibe (ZETIA) 10 MG tablet Take 10 mg by mouth at bedtime.      . fenofibrate (TRICOR) 145 MG tablet Take 145 mg by mouth at bedtime.      Marland Kitchen lisinopril (PRINIVIL,ZESTRIL) 10 MG tablet Take 5 mg by mouth every morning.      Marland Kitchen omeprazole (PRILOSEC) 20 MG capsule Take 20 mg by mouth 2 (two) times daily.      . polyethylene glycol (MIRALAX / GLYCOLAX) packet Take 17 g by mouth daily as needed (constipation).      . pravastatin (PRAVACHOL) 40 MG tablet Take 40 mg by mouth at bedtime.      . simethicone (MYLICON) 125 MG chewable tablet Chew 125 mg by mouth every 6 (six) hours as needed for flatulence.      . traMADol (ULTRAM) 50 MG tablet Take 50 mg by mouth every 6 (six) hours as needed for pain.        Results for orders placed during the hospital encounter of 04/26/13 (from the past 48 hour(s))  CBC     Status: Abnormal   Collection Time    04/26/13 11:20 PM      Result Value Range   WBC 13.3 (*) 4.0 - 10.5 K/uL   RBC 4.00  3.87 - 5.11 MIL/uL   Hemoglobin 12.2  12.0 - 15.0 g/dL   HCT 27.2 (*) 53.6 - 64.4 %   MCV 86.8  78.0 - 100.0 fL   MCH 30.5  26.0 - 34.0 pg   MCHC 35.2  30.0 - 36.0 g/dL   RDW 03.4  74.2 - 59.5 %   Platelets 397  150 - 400 K/uL  BASIC METABOLIC PANEL     Status: Abnormal   Collection Time    04/26/13 11:20 PM      Result Value Range   Sodium 137  135 - 145 mEq/L   Potassium 3.8  3.5 - 5.1 mEq/L   Chloride 100  96 - 112 mEq/L   CO2 25  19 - 32 mEq/L   Glucose, Bld 116 (*) 70 - 99 mg/dL   BUN 24 (*) 6 - 23 mg/dL   Creatinine, Ser 6.38  0.50 - 1.10 mg/dL   Calcium 8.6  8.4 - 75.6 mg/dL   GFR calc non Af Amer 60 (*) >90 mL/min   GFR calc Af Amer 69 (*) >90 mL/min   Comment:            The eGFR has been calculated     using the CKD EPI equation.     This calculation has not been  validated in all clinical     situations.     eGFR's persistently     <90 mL/min signify      possible Chronic Kidney Disease.  POCT I-STAT TROPONIN I     Status: None   Collection Time    04/27/13 12:29 AM      Result Value Range   Troponin i, poc 0.01  0.00 - 0.08 ng/mL   Comment 3            Comment: Due to the release kinetics of cTnI,     a negative result within the first hours     of the onset of symptoms does not rule out     myocardial infarction with certainty.     If myocardial infarction is still suspected,     repeat the test at appropriate intervals.  POCT I-STAT, CHEM 8     Status: Abnormal   Collection Time    04/27/13 12:31 AM      Result Value Range   Sodium 139  135 - 145 mEq/L   Potassium 3.6  3.5 - 5.1 mEq/L   Chloride 103  96 - 112 mEq/L   BUN 28 (*) 6 - 23 mg/dL   Creatinine, Ser 4.09  0.50 - 1.10 mg/dL   Glucose, Bld 811 (*) 70 - 99 mg/dL   Calcium, Ion 9.14 (*) 1.13 - 1.30 mmol/L   TCO2 27  0 - 100 mmol/L   Hemoglobin 12.2  12.0 - 15.0 g/dL   HCT 78.2  95.6 - 21.3 %  TROPONIN I     Status: Abnormal   Collection Time    04/27/13  4:15 AM      Result Value Range   Troponin I 0.40 (*) <0.30 ng/mL   Comment:            Due to the release kinetics of cTnI,     a negative result within the first hours     of the onset of symptoms does not rule out     myocardial infarction with certainty.     If myocardial infarction is still suspected,     repeat the test at appropriate intervals.     CRITICAL RESULT CALLED TO, READ BACK BY AND VERIFIED WITH:     A.SLOANE RN 0512 04/27/13 E.GADDY  COMPREHENSIVE METABOLIC PANEL     Status: Abnormal   Collection Time    04/27/13  4:15 AM      Result Value Range   Sodium 137  135 - 145 mEq/L   Potassium 4.6  3.5 - 5.1 mEq/L   Chloride 101  96 - 112 mEq/L   CO2 22  19 - 32 mEq/L   Glucose, Bld 100 (*) 70 - 99 mg/dL   BUN 24 (*) 6 - 23 mg/dL   Creatinine, Ser 0.86  0.50 - 1.10 mg/dL   Calcium 8.6  8.4 - 57.8 mg/dL   Total Protein 6.7  6.0 - 8.3 g/dL   Albumin 3.5  3.5 - 5.2 g/dL   AST 51 (*) 0 - 37 U/L    ALT 32  0 - 35 U/L   Alkaline Phosphatase 50  39 - 117 U/L   Total Bilirubin 0.2 (*) 0.3 - 1.2 mg/dL   GFR calc non Af Amer 60 (*) >90 mL/min   GFR calc Af Amer 69 (*) >90 mL/min   Comment:            The eGFR has been  calculated     using the CKD EPI equation.     This calculation has not been     validated in all clinical     situations.     eGFR's persistently     <90 mL/min signify     possible Chronic Kidney Disease.  CBC WITH DIFFERENTIAL     Status: Abnormal   Collection Time    04/27/13  4:15 AM      Result Value Range   WBC 15.3 (*) 4.0 - 10.5 K/uL   RBC 4.06  3.87 - 5.11 MIL/uL   Hemoglobin 12.5  12.0 - 15.0 g/dL   HCT 19.1 (*) 47.8 - 29.5 %   MCV 87.7  78.0 - 100.0 fL   MCH 30.8  26.0 - 34.0 pg   MCHC 35.1  30.0 - 36.0 g/dL   RDW 62.1  30.8 - 65.7 %   Platelets 369  150 - 400 K/uL   Neutrophils Relative 85 (*) 43 - 77 %   Neutro Abs 13.1 (*) 1.7 - 7.7 K/uL   Lymphocytes Relative 8 (*) 12 - 46 %   Lymphs Abs 1.2  0.7 - 4.0 K/uL   Monocytes Relative 4  3 - 12 %   Monocytes Absolute 0.6  0.1 - 1.0 K/uL   Eosinophils Relative 2  0 - 5 %   Eosinophils Absolute 0.3  0.0 - 0.7 K/uL   Basophils Relative 1  0 - 1 %   Basophils Absolute 0.1  0.0 - 0.1 K/uL  TROPONIN I     Status: Abnormal   Collection Time    04/27/13  9:00 AM      Result Value Range   Troponin I 1.21 (*) <0.30 ng/mL   Comment:            Due to the release kinetics of cTnI,     a negative result within the first hours     of the onset of symptoms does not rule out     myocardial infarction with certainty.     If myocardial infarction is still suspected,     repeat the test at appropriate intervals.     CRITICAL VALUE NOTED.  VALUE IS CONSISTENT WITH PREVIOUSLY REPORTED AND CALLED VALUE.   Dg Chest Port 1 View  04/26/2013  *RADIOLOGY REPORT*  Clinical Data: Chest pain, short of breath  PORTABLE CHEST - 1 VIEW  Comparison: Chest radiograph 12/08/2009  Findings: Stable cardiac silhouette.  There is  increased fine nodular air space pattern within the left and right lung.  No pleural fluid.  No pneumothorax.  IMPRESSION: New bilateral nodular air space disease suggest pulmonary edema versus diffuse pulmonary infection.  Recommend follow-up chest radiograph and if no improvement consider CT.   Original Report Authenticated By: Genevive Bi, M.D.     ROS: General:no colds or fevers, no weight changes Skin:no rashes or ulcers HEENT:no blurred vision, no congestion CV:see HPI PUL:see HPI GI:no diarrhea constipation or melena, no indigestion GU:no hematuria, no dysuria MS:no joint pain, no claudication Neuro:no syncope, no lightheadedness Endo:no diabetes, no thyroid disease   Blood pressure 131/64, pulse 73, temperature 97.6 F (36.4 C), temperature source Oral, resp. rate 15, height 5\' 5"  (1.651 m), weight 162 lb 11.2 oz (73.8 kg), SpO2 99.00%. PE: General:alert and oriented, no acute distress, pleasant affect Skin:warm and dry, brisk capillary refill HEENT:normocephalic, sclera clear, moist mucus membranes Neck:supple, no JVD, + lt carotid bruit Heart:S1S2 RRR without murmur gallup or rub  Lungs:clear without rales, rhonchi or wheezes Abd:+ BS soft, non tender, do not palpate, liver, spleen or masses Ext:no edema. 2 + pedal pulses bil, 2+ radial pulses bil Neuro:alert and oriented X 3 MAE, follows commands, + facial symmetry    Assessment/Plan Principal Problem:   Atrial fibrillation with RVR, hx of PAF previously Active Problems:   Leucocytosis   PAD (peripheral artery disease)   Hyperlipidemia   History of CVA (cerebrovascular accident)   NSTEMI (non-ST elevated myocardial infarction)  PLAN: ? If NSTEMI is due to tachycardia vs occlusive CAD or both. Recheck EKG, pt has never had cardiac cath. MD to see, prob. Cath. + FH of CAD, HTN, Hyperlipidemia, PAD, and until recently tobacco use.   We will be glad to assume care as this is all cardiac.   Leone Brand  Nurse  Practitioner Certified [facility] Pager [phone] 04/27/2013, 10:33 AM  I have seen and evaluated the patient this AM along with Nada Boozer, NP. I agree with her findings, examination as well as impression recommendations.  62 y/o woman with PAD & carotid disease & PAF who presented with Afib RVR after doing yard work -- this was assoiciated with significant chest pressure out of proportion to previous episodes.  ECG in Afib had ~3-4 mm Anterolateral & ~2-3 mm Inferior ST depressions concerning for ischemia & she has ruled in for MI.   She was converted to NSR (with intermitted ? PAT vs. SVT) & feels fine currently.  BP is stable.  No active sign of PNA -- was given Levaquin in ER (probably no need to continue).  Given her significant Cardiac RFs (essential CAD risk equivalent with PAD), I think the most prudent COA is to proceed with cardiac catheterization to define her anatomy now that she has ruled in for MI with dynamic ECG changes in setting of Afib RVR -- essentially "failed" her ST.   Will make further medication adjustments based upon findings.  DISPO: Will try to work towards quick d/c as she is the sole caregiver for her 30 y/o granddaughter.   CARDIAC CATHETERIZATION CONSENT: Performing MD:  Marykay Lex, M.D., M.S.  Procedure:  Left Heart Catheterization with Native Coronary Angiography +/- Percutaneous Coronary Intervention.  The procedure with Risks/Benefits/Alternatives and Indications was reviewed with the patient.  All questions were answered.    Risks / Complications include, but not limited to: Death, MI, CVA/TIA, VF/VT (with defibrillation), Bradycardia (need for temporary pacer placement), contrast induced nephropathy, bleeding / bruising / hematoma / pseudoaneurysm, vascular or coronary injury (with possible emergent CT or Vascular Surgery), adverse medication reactions, infection.    The patient voiced understanding and agree to  proceed.   Consent form signed & on chart.   Marykay Lex, M.D., M.S. [facility] [address] [place], [place]  [address]  [phone] Pager # [phone] 04/27/2013 12:09 PM

## 2013-04-27 NOTE — Progress Notes (Signed)
Utilization Review Completed.[name] T5/07/2013

## 2013-04-27 NOTE — Brief Op Note (Signed)
04/26/2013 - 04/27/2013  4:17 PM  PATIENT:  [name]  62 y.o. female with significant PAD & h/o CVA & PAF who presented with AFib with RVR, ischemic ECG changes   PRE-OPERATIVE DIAGNOSIS:  NSTEMI  POST-OPERATIVE DIAGNOSIS:    Mild to Moderate Left Main as well as LAD disease with moderate distal AV Groove Circumflex stenosis.  Normal, hyperdynamic LV EF with normal LVEDP  ~2+ Mitral Regurgitation   PROCEDURE:  Procedure(s): LEFT HEART CATHETERIZATION WITH CORONARY ANGIOGRAM (Right)  SURGEON and Role:    * Marykay Lex, MD - Primary  ANESTHESIA:   local and IV sedation - 2 ml Lidocaine, 2 mg Versed, 25 mcg Fentanyl  EBL:  Total I/O In: -  Out: 2000 [Urine:2000]  DRAINS: 5 Fr R Radial Access --> TIG 4.0 for LCA & RCA Angiography --> exchanged for Angled Pigtail for LV Hemodynamics & LV Gram   TR BAND: 11 ml air; 1605 hours  DICTATION: .Note written in EPIC  PLAN OF CARE: Monitor overnight due to positive Troponins.  Will start Flecainide 50 mg po BID for rhythm control.  PATIENT DISPOSITION:  PACU - hemodynamically stable.   Delay start of Pharmacological VTE agent (>24hrs) due to surgical blood loss or risk of bleeding: not applicable.  Marykay Lex, M.D., M.S. [facility] [address] [place], [place]  [address]  [phone] Pager # [phone] 04/27/2013 4:25 PM

## 2013-04-27 NOTE — CV Procedure (Signed)
[facility] CARDIAC CATHETERIZATION REPORT  NAME:  PETRITA [name]   MRN: [id number] DOB:  04-24-1951   ADMIT DATE: 04/26/2013 Procedure Date: 04/27/2013  INTERVENTIONAL CARDIOLOGIST: Marykay Lex, M.D., MS PRIMARY CARE PROVIDER: No primary provider on file. PRIMARY CARDIOLOGIST: Raiford Noble  PATIENT:  [name] is a 62 y.o. female with a PMH significant for PAD, PAF and CVA who presented in Afib with RVR having anginal symptoms.  She had extensive Inferior and Anterolateral ST Segment depressions (2-3, and 3-4 mm respectively).  She has ruled in for a NSTEMI.  Upon spontaneous conversion following IV Diltiazem bolus, her angina abated. Due to her significant coronary risk factors, she is referred for invasive cardiac evaluation with coronary angiography.   PRE-OPERATIVE DIAGNOSIS:    NSTEMI  Atrial Fibrillation with Rapid Ventricular Rate  Peripheral Artery Disease  PROCEDURES PERFORMED:    Left Heart Catheterization with Native Coronary Angiography via Right Radial Artery access  Left Ventriculography  PROCEDURE:Consent:  Risks of procedure as well as the alternatives and risks of each were explained to the (patient/caregiver).  Consent for procedure obtained. Consent for signed by MD and patient with RN witness -- placed on chart.   PROCEDURE: The patient was brought to the 2nd Floor  Cardiac Catheterization Lab in the fasting state and prepped and draped in the usual sterile fashion for Right groin or radial access. A modified Allen's test with plethysmography was performed, revealing excellent Ulnar artery collateral flow.  Sterile technique was used including antiseptics, cap, gloves, gown, hand hygiene, mask and sheet.  Skin prep: Chlorhexidine.  Time Out: Verified patient identification, verified procedure, site/side was marked, verified correct patient position, special equipment/implants available, medications/allergies/relevent  history reviewed, required imaging and test results available.  Performed  Access: Right Radial Artery; 5-6 Fr Glide Sheath - slender -- Seldinger technique using the Angiocath Micropuncture Kit  IARadial Cocktail, IV Heparin Diagnostic:  TIG 4.0 catheter advanced over Versicore wire into the Ascending Aorta; exchanged over long exchange safety J-wire  Left & Right Coronary Artery Angiography: TIG 4.0  LV Hemodynamics (LV Gram): Angled Pigtail  TR Band:  1600 Hours, 11 mL air; non-occlusive hemostasis confirmed with plethysmography  MEDICATIONS:  Anesthesia:  Local Lidocaine 2 ml  Sedation:  2 mg IV Versed, 25 mcg IV fentanyl ;   Omnipaque Contrast: 70 ml  Anticoagulation:  IV Heparin 3500 Units  Radial Cocktail: 5 mg Verapamil, 400 mcg NTG, 2 ml 2% Lidocaine in 10 ml NS   Hemodynamics:  Central Aortic Pressure: 134/64 mmHg  Left Ventricular Pressures / EDP: 133/4 mmHg; 10 mmHg  Left Ventriculography:  EF: 65-70 %  Wall Motion: Normal  Coronary Anatomy:  Left Main: Large caliber, moderately calcified vessel with an eccentric, ostial ~20-30% stenosis.  It bifurcates distally into the LAD and Circumflex LAD: Moderate to large caliber, moderately calcified vessel with diffuse ~30% proximal stenosis.  The vessel then normalizes before giving off a major First Diagonal (D1), then begins to taper distally to the apex with one more mid Second Diagonal (D2)  D1: Moderate caliber, major anterolateral branch; angiographically normal.  D2: Small to moderate caliber vessel, diffuse mild luminal irregularities. Left Circumflex: Moderate caliber with a proximal OM1 then continues in the AV grove to give off a small Inferolateral OM2 and several very small left posterolateral branches.  There is a ~50-60% lesion in the distal bend of OM2.  OM1: Small to moderate caliber vessel, that bifurcates distally; angiographically normal  OM 2: Small caliber vessel, diffuse luminal  irregularities.   RCA: Moderate caliber, dominant vessel with a focal ~40% lesion at the first bend; mildly dilated.  The vessel then continues on to bifurcate distally into the Right Posterior Descending Artery (rPDA) and the Right Posterior AV Groove Branch (RPAV) with no angiographic evidence of disease.  RPDA: Moderate caliber, extensive vessel that reaches the apex.  RPL Sysytem:The RPAV begins as a small to moderate vessel that bifurcates into two main Posterolateral branches, the proximal one has several small branches.  Minimal luminal irregularities  PATIENT DISPOSITION:    The patient was transferred to the PACU holding area in a hemodynamicaly stable, chest pain free condition.  The patient tolerated the procedure well, and there were no complications.  EBL:   < 10 ml  The patient was stable before, during, and after the procedure.  POST-OPERATIVE DIAGNOSIS:    Essentially non-occlusive CAD with mild to moderate calcified lesions in the ostial Left Main and proximal LAD along with moderate proximal RCA and distal AV Groove Circumflex lesions.  No obvious culprit lesion for elevated Troponin levels, suspect global ischemia (possibley microvascular) from Afib with RVR  Hyperdynamic Left Ventricle with ~2+ Mitral Regurgitation  Normal LVEDP   PLAN OF CARE:  Standard post radial cath care  Monitor over night due to elevated Troponin levels  Will initiate Flecainide 50 mg bid for rhythm control  Will need to discuss long term oral anticoagulation, she would prefer a NOAC -- Eliquis vs. Xarelto   Marykay Lex, M.D., M.S. [facility] [address] [place], [place]  [address]  [phone]  04/27/2013 9:24 PM

## 2013-04-27 NOTE — Progress Notes (Signed)
[name] gabby lab tech called pt TROPONIN is 0.40. Pt without chest pain.Called [name] np, made her aware. No new orders given.

## 2013-04-27 NOTE — Care Management Note (Unsigned)
    Page 1 of 1   04/27/2013     4:18:41 PM   CARE MANAGEMENT NOTE 04/27/2013  Patient:  [name], [name]   Account Number:  [id number]  Date Initiated:  04/27/2013  Documentation initiated by:  Terianna Peggs  Subjective/Objective Assessment:   PT ADM ON 04/26/13 WITH AFIB WITH RVR.  PTA, PT INDEPENDENT OF ADLS.     Action/Plan:   WILL FOLLOW FOR HOME NEEDS AS PT PROGRESSES.   Anticipated DC Date:  04/29/2013   Anticipated DC Plan:  HOME/SELF CARE      DC Planning Services  CM consult      Choice offered to / List presented to:             Status of service:  In process, will continue to follow Medicare Important Message given?   (If response is "NO", the following Medicare IM given date fields will be blank) Date Medicare IM given:   Date Additional Medicare IM given:    Discharge Disposition:    Per UR Regulation:  Reviewed for med. necessity/level of care/duration of stay  If discussed at Long Length of Stay Meetings, dates discussed:    Comments:

## 2013-04-28 ENCOUNTER — Other Ambulatory Visit: Payer: Self-pay | Admitting: Physician Assistant

## 2013-04-28 DIAGNOSIS — I214 Non-ST elevation (NSTEMI) myocardial infarction: Secondary | ICD-10-CM

## 2013-04-28 MED ORDER — APIXABAN 5 MG PO TABS: 5.0000 mg | ORAL_TABLET | Freq: Two times a day (BID) | ORAL | Status: DC

## 2013-04-28 MED ORDER — LEVOFLOXACIN 750 MG PO TABS
750.0000 mg | ORAL_TABLET | ORAL | Status: DC
  Filled 2013-04-28: qty 1

## 2013-04-28 NOTE — Progress Notes (Signed)
[facility]  Subjective:   Objective: Vital signs in last 24 hours: Temp:  [97.7 F (36.5 C)-98 F (36.7 C)] 97.7 F (36.5 C) (05/09 0331) Pulse Rate:  [66-86] 66 (05/09 0331) Resp:  [17-18] 18 (05/09 0331) BP: (133-157)/(55-74) 133/55 mmHg (05/09 0331) SpO2:  [98 %] 98 % (05/09 0331) Last BM Date: 04/27/13  Intake/Output from previous day: 05/08 0701 - 05/09 0700 In: 3 [I.V.:3] Out: 4250 [Urine:4250] Intake/Output this shift:    Medications Current Facility-Administered Medications  Medication Dose Route Frequency Provider Last Rate Last Dose  . 0.9 %  sodium chloride infusion   Intravenous Continuous Eduard Clos, MD 10 mL/hr at 04/27/13 0413 10 mL at 04/27/13 0413  . acetaminophen (TYLENOL) tablet 650 mg  650 mg Oral Q6H PRN Eduard Clos, MD       Or  . acetaminophen (TYLENOL) suppository 650 mg  650 mg Rectal Q6H PRN Eduard Clos, MD      . aspirin EC tablet 81 mg  81 mg Oral Daily Eduard Clos, MD      . atorvastatin (LIPITOR) tablet 10 mg  10 mg Oral q1800 Marykay Lex, MD      . diltiazem [facility] CD) 24 hr capsule 180 mg  180 mg Oral q morning - 10a Eduard Clos, MD   180 mg at 04/27/13 1148  . enoxaparin (LOVENOX) injection 40 mg  40 mg Subcutaneous Q24H Marykay Lex, MD      . ezetimibe (ZETIA) tablet 10 mg  10 mg Oral QHS Eduard Clos, MD   10 mg at 04/27/13 2151  . fenofibrate tablet 160 mg  160 mg Oral Daily Eduard Clos, MD   160 mg at 04/27/13 1148  . levofloxacin (LEVAQUIN) IVPB 750 mg  750 mg Intravenous QHS Eduard Clos, MD 100 mL/hr at 04/27/13 2155 750 mg at 04/27/13 2155  . lisinopril (PRINIVIL,ZESTRIL) tablet 5 mg  5 mg Oral q morning - 10a Eduard Clos, MD   5 mg at 04/27/13 1149  . ondansetron (ZOFRAN) tablet 4 mg  4 mg Oral Q6H PRN Eduard Clos, MD       Or  . ondansetron [facility]) injection 4 mg  4 mg Intravenous Q6H PRN Eduard Clos, MD       . pantoprazole (PROTONIX) EC tablet 40 mg  40 mg Oral Daily Eduard Clos, MD   40 mg at 04/27/13 1147  . polyethylene glycol (MIRALAX / GLYCOLAX) packet 17 g  17 g Oral Daily PRN Eduard Clos, MD      . simethicone [facility]) chewable tablet 120 mg  120 mg Oral Q6H PRN Eduard Clos, MD      . sodium chloride 0.9 % injection 3 mL  3 mL Intravenous Q12H Eduard Clos, MD   3 mL at 04/27/13 2152  . traMADol (ULTRAM) tablet 50 mg  50 mg Oral Q6H PRN Eduard Clos, MD        PE:   Lab Results:   Recent Labs  04/26/13 2320 04/27/13 0031 04/27/13 0415 04/27/13 1855  WBC 13.3*  --  15.3* 10.0  HGB 12.2 12.2 12.5 12.1  HCT 34.7* 36.0 35.6* 35.3*  PLT 397  --  369 328   BMET  Recent Labs  04/26/13 2320 04/27/13 0031 04/27/13 0415 04/27/13 1855  NA 137 139 137  --   K 3.8 3.6 4.6  --   CL  100 103 101  --   CO2 25  --  22  --   GLUCOSE 116* 113* 100*  --   BUN 24* 28* 24*  --   CREATININE 1.00 1.00 1.00 1.04  CALCIUM 8.6  --  8.6  --    PT/INR  Recent Labs  04/27/13 1310  LABPROT 13.3  INR 1.02   Hemodynamics:  Central Aortic Pressure: 134/64 mmHg  Left Ventricular Pressures / EDP: 133/4 mmHg; 10 mmHg Left Ventriculography:  EF: 65-70 %  Wall Motion: Normal Coronary Anatomy:  Left Main: Large caliber, moderately calcified vessel with an eccentric, ostial ~20-30% stenosis. It bifurcates distally into the LAD and Circumflex LAD: Moderate to large caliber, moderately calcified vessel with diffuse ~30% proximal stenosis. The vessel then normalizes before giving off a major First Diagonal (D1), then begins to taper distally to the apex with one more mid Second Diagonal (D2)  D1: Moderate caliber, major anterolateral branch; angiographically normal.  D2: Small to moderate caliber vessel, diffuse mild luminal irregularities. Left Circumflex: Moderate caliber with a proximal OM1 then continues in the AV grove to give off a small  Inferolateral OM2 and several very small left posterolateral branches. There is a ~50-60% lesion in the distal bend of OM2.  OM1: Small to moderate caliber vessel, that bifurcates distally; angiographically normal  OM 2: Small caliber vessel, diffuse luminal irregularities. RCA: Moderate caliber, dominant vessel with a focal ~40% lesion at the first bend; mildly dilated. The vessel then continues on to bifurcate distally into the Right Posterior Descending Artery (rPDA) and the Right Posterior AV Groove Branch (RPAV) with no angiographic evidence of disease.  RPDA: Moderate caliber, extensive vessel that reaches the apex.  RPL Sysytem:The RPAV begins as a small to moderate vessel that bifurcates into two main Posterolateral branches, the proximal one has several small branches. Minimal luminal irregularities PATIENT DISPOSITION:  The patient was transferred to the PACU holding area in a hemodynamicaly stable, chest pain free condition.  The patient tolerated the procedure well, and there were no complications. EBL: < 10 ml  The patient was stable before, during, and after the procedure. POST-OPERATIVE DIAGNOSIS:  Essentially non-occlusive CAD with mild to moderate calcified lesions in the ostial Left Main and proximal LAD along with moderate proximal RCA and distal AV Groove Circumflex lesions.  No obvious culprit lesion for elevated Troponin levels, suspect global ischemia (possibley microvascular) from Afib with RVR  Hyperdynamic Left Ventricle with ~2+ Mitral Regurgitation  Normal LVEDP PLAN OF CARE:  Standard post radial cath care  Monitor over night due to elevated Troponin levels  Will initiate Flecainide 50 mg bid for rhythm control  Will need to discuss long term oral anticoagulation, she would prefer a NOAC -- Eliquis vs. Xarelto Marykay Lex, M.D., M.S.  [facility]  [address]  [place], [place] [address]  [phone]   04/27/2013  Assessment/Plan  Principal Problem:   Atrial fibrillation with RVR, hx of PAF previously Active Problems:   Leucocytosis   PAD (peripheral artery disease)   Hyperlipidemia   History of CVA (cerebrovascular accident)   NSTEMI (non-ST elevated myocardial infarction)  Plan:  SP LHC revealing non-occlusive CAD(see above cath note).  Afib RVR now maintaining NSR in the 80's.  Now on flecainide, cardizem CD 180.  BP stable.   Echo pending but may do as outpatient.  Lkely DC today.  Start eliquis.     LOS: 2 days    HAGER, BRYAN  04/28/2013 10:32 AM  I have seen and evaluated the patient this AM along with Wilburt Finlay, PA. I agree with his findings, examination as well as impression recommendations.  She looks & feels great.  No more Afib & no more CP.  Cardiac cath did not reveal obstructive CAD.    With h/o CVA, PAD & Female -- meets CHADS2 score for long term AC, we discussed warfarin vs. NOAC & she prefers idea of NOAC.  Will start Eliquis 5 mg BID (can f/u with [facility] pharmacist for details of monitoring).  Will need 2D Echo, but can be done as OP.    OK for d/c - plan f/u with either Nada Boozer, NP or Wilburt Finlay, PA in 1-2 weeks, followed by routing ROV with Dr. Erlene Quan.   Marykay Lex, M.D., M.S. [facility] [address] [place], [place]  [address]  [phone] Pager # [phone] 04/28/2013 10:52 AM

## 2013-04-28 NOTE — Progress Notes (Signed)
PHARMACIST - PHYSICIAN COMMUNICATION DR:   [facility] team CONCERNING: Antibiotic IV to Oral Route Change Policy  RECOMMENDATION: This patient is receiving Levaquin by the intravenous route.  Based on criteria approved by the Pharmacy and Therapeutics Committee, the antibiotic(s) is/are being converted to the equivalent oral dose form(s).   DESCRIPTION: These criteria include:  Patient being treated for a respiratory tract infection, urinary tract infection, or cellulitis  The patient is not neutropenic and does not exhibit a GI malabsorption state  The patient is eating (either orally or via tube) and/or has been taking other orally administered medications for a least 24 hours  The patient is improving clinically and has a Tmax < 100.5  If you have questions about this conversion, please contact the Pharmacy Department  []   [phone] )  [facility] [x]   [phone] )  [facility]  []   [phone] )  [facility] []   [phone] )  [facility]   Thanks, [place] K. Allena Katz, PharmD, BCPS.  Clinical Pharmacist Pager [phone]. 04/28/2013 11:30 AM

## 2013-04-28 NOTE — Discharge Summary (Signed)
Physician Discharge Summary  Patient ID: [name] MRN: [id number] DOB/AGE: [birth year] 62 y.o.  Admit date: 04/26/2013 Discharge date: 04/28/2013  Admission Diagnoses:  Atrial fibrillation with rapid ventricular response.  Discharge Diagnoses:  Principal Problem:   Atrial fibrillation with RVR, hx of PAF previously Active Problems:   Leucocytosis   PAD (peripheral artery disease)   Hyperlipidemia   History of CVA (cerebrovascular accident)   NSTEMI (non-ST elevated myocardial infarction)   Discharged Condition: stable  Hospital Course:   62 y.o. female with known history of paroxysmal atrial fibrillation, hypertension, peripheral arterial disease status post stenting to lt external iliac and Rt SFA with known occluded Lt SFA, previous history of cigarette smoking, history of CVA was brought to the ER. Patient had been having palpitations and chest pressure. Patient stated that after working in the yard she started feeling funny and checked her blood pressure and she found it was low. Later she started developing chest pressure and palpitations and felt nauseated and vomitted once. She also became diaphoretic. This was much worse than her previous episodes of PAF. EMS was called and the patient was brought to the ER. In the ER EKG showed A. fib with RVR and patient was initially given Cardizem bolus after which patient's blood pressure dropped and was given IV fluids. Cardizem was discontinued. Patient's heart rate improved found to be in sinus rhythm with blood pressure in the 110 systolic. Patient was chest pain-free on Hospitalist's . Denied any shortness of breath. Patient did not have any abdominal pain or diarrhea. Patient stated she had been having some nonproductive cough for last one month. EKG showed A. fib with RVR and ST depressions. Chest x-ray shows congestion with possibility of infiltrates. Blood work showed mild leukocytosis.  + troponins 0.40 and second 1.21. EKG A fib with  diffuse ischemia with RVR. Will check in SR. Previously pt had been on coumadin but developed nosebleeds, has been on asprin.  Due to NSTEMI the patient was scheduled for coronary angiography.  The study revealed nonocclusive CAD with mild to moderate calcified lesions in the ostial left main and proximal LAD with moderate proximal RCA and distal AV groove circumflex lesions.  LV gram showed hyperdynamic left ventricular function with proximally 2+ mitral regurgitation.  She started on flecainide 50 mg twice daily for rhythm control.  In addition she was started on eliquis for stroke prophylaxis(CHADS2 score 2).  2D echocardiogram ordered in the outpatient setting. The patient was seen by Dr. Herbie Baltimore who felt she was stable for DC home.   The patient orders:  2-D echocardiogram ordered  Consults: None  Significant Diagnostic Studies: Coronary angiography Hemodynamics:  Central Aortic Pressure: 134/64 mmHg  Left Ventricular Pressures / EDP: 133/4 mmHg; 10 mmHg Left Ventriculography:  EF: 65-70 %  Wall Motion: Normal Coronary Anatomy:  Left Main: Large caliber, moderately calcified vessel with an eccentric, ostial ~20-30% stenosis. It bifurcates distally into the LAD and Circumflex LAD: Moderate to large caliber, moderately calcified vessel with diffuse ~30% proximal stenosis. The vessel then normalizes before giving off a major First Diagonal (D1), then begins to taper distally to the apex with one more mid Second Diagonal (D2)  D1: Moderate caliber, major anterolateral branch; angiographically normal.  D2: Small to moderate caliber vessel, diffuse mild luminal irregularities. Left Circumflex: Moderate caliber with a proximal OM1 then continues in the AV grove to give off a small Inferolateral OM2 and several very small left posterolateral branches. There is a ~50-60% lesion in the  distal bend of OM2.  OM1: Small to moderate caliber vessel, that bifurcates distally; angiographically normal  OM  2: Small caliber vessel, diffuse luminal irregularities. RCA: Moderate caliber, dominant vessel with a focal ~40% lesion at the first bend; mildly dilated. The vessel then continues on to bifurcate distally into the Right Posterior Descending Artery (rPDA) and the Right Posterior AV Groove Branch (RPAV) with no angiographic evidence of disease.  RPDA: Moderate caliber, extensive vessel that reaches the apex.  RPL Sysytem:The RPAV begins as a small to moderate vessel that bifurcates into two main Posterolateral branches, the proximal one has several small branches. Minimal luminal irregularities PATIENT DISPOSITION:  The patient was transferred to the PACU holding area in a hemodynamicaly stable, chest pain free condition.  The patient tolerated the procedure well, and there were no complications. EBL: < 10 ml  The patient was stable before, during, and after the procedure. POST-OPERATIVE DIAGNOSIS:  Essentially non-occlusive CAD with mild to moderate calcified lesions in the ostial Left Main and proximal LAD along with moderate proximal RCA and distal AV Groove Circumflex lesions.  No obvious culprit lesion for elevated Troponin levels, suspect global ischemia (possibley microvascular) from Afib with RVR  Hyperdynamic Left Ventricle with ~2+ Mitral Regurgitation  Normal LVEDP PLAN OF CARE:  Standard post radial cath care  Monitor over night due to elevated Troponin levels  Will initiate Flecainide 50 mg bid for rhythm control  Will need to discuss long term oral anticoagulation, she would prefer a NOAC -- Eliquis vs. Xarelto Marykay Lex, M.D., M.S.  [facility]  [address]  [place], [place] [address]  [phone]  04/27/2013  CBC    Component Value Date/Time   WBC 10.0 04/27/2013 1855   RBC 3.90 04/27/2013 1855   HGB 12.1 04/27/2013 1855   HCT 35.3* 04/27/2013 1855   PLT 328 04/27/2013 1855   MCV 90.5 04/27/2013 1855   MCH 31.0 04/27/2013 1855   MCHC  34.3 04/27/2013 1855   RDW 14.9 04/27/2013 1855   LYMPHSABS 1.2 04/27/2013 0415   MONOABS 0.6 04/27/2013 0415   EOSABS 0.3 04/27/2013 0415   BASOSABS 0.1 04/27/2013 0415    BMET    Component Value Date/Time   NA 137 04/27/2013 0415   K 4.6 04/27/2013 0415   CL 101 04/27/2013 0415   CO2 22 04/27/2013 0415   GLUCOSE 100* 04/27/2013 0415   BUN 24* 04/27/2013 0415   CREATININE 1.04 04/27/2013 1855   CALCIUM 8.6 04/27/2013 0415   GFRNONAA 57* 04/27/2013 1855   GFRAA 66* 04/27/2013 1855    Cardiac Panel (last 3 results)  Recent Labs  04/27/13 0415 04/27/13 0900 04/27/13 1549  TROPONINI 0.40* 1.21* 0.92*    Treatments: See above.  Discharge Exam: Blood pressure 133/55, pulse 66, temperature 97.7 F (36.5 C), temperature source Oral, resp. rate 18, height 5\' 5"  (1.651 m), weight 162 lb 11.2 oz (73.8 kg), SpO2 98.00%.   Disposition: 01-Home or Self Care  Discharge Orders   Future Orders Complete By Expires     Diet - low sodium heart healthy  As directed     Increase activity slowly  As directed         Medication List    TAKE these medications       acetaminophen 500 MG tablet  Commonly known as:  TYLENOL  Take 1,000 mg by mouth every 6 (six) hours as needed for pain.     apixaban 5 MG Tabs  tablet  Commonly known as:  ELIQUIS  Take 1 tablet (5 mg total) by mouth 2 (two) times daily.     aspirin EC 81 MG tablet  Take 81 mg by mouth daily.     diltiazem 180 MG 24 hr capsule  Commonly known as:  CARDIZEM CD  Take 180 mg by mouth every morning.     ezetimibe 10 MG tablet  Commonly known as:  ZETIA  Take 10 mg by mouth at bedtime.     fenofibrate 145 MG tablet  Commonly known as:  TRICOR  Take 145 mg by mouth at bedtime.     lisinopril 10 MG tablet  Commonly known as:  PRINIVIL,ZESTRIL  Take 5 mg by mouth every morning.     omeprazole 20 MG capsule  Commonly known as:  PRILOSEC  Take 20 mg by mouth 2 (two) times daily.     polyethylene glycol packet  Commonly known as:   MIRALAX / GLYCOLAX  Take 17 g by mouth daily as needed (constipation).     pravastatin 40 MG tablet  Commonly known as:  PRAVACHOL  Take 40 mg by mouth at bedtime.     simethicone 125 MG chewable tablet  Commonly known as:  MYLICON  Chew 125 mg by mouth every 6 (six) hours as needed for flatulence.     traMADol 50 MG tablet  Commonly known as:  ULTRAM  Take 50 mg by mouth every 6 (six) hours as needed for pain.           Follow-up Information   Follow up with [facility] R, NP. (Our office will call you with the follow up appt date and time.)    Contact information:   [address] [place] [address] [phone]       Signed: Wilburt Finlay 04/28/2013, 1:42 PM  No further CP or Afib.  No HF symptoms. Cath with non-obstructive CAD.  Can have Echo as OP -- order prior to f/u with Ms. Annie Paras, NP.  Marykay Lex, M.D., M.S. [facility] [address] [place], [place]  [address]  [phone] Pager # [phone] 04/28/2013 10:36 PM

## 2013-04-28 NOTE — Progress Notes (Signed)
Pt discharging home with family.  Instructions reviewed, questions answered, follow up in place.

## 2013-05-11 ENCOUNTER — Encounter: Payer: Self-pay | Admitting: Cardiology

## 2013-05-11 ENCOUNTER — Ambulatory Visit (INDEPENDENT_AMBULATORY_CARE_PROVIDER_SITE_OTHER): Payer: PRIVATE HEALTH INSURANCE | Admitting: Cardiology

## 2013-05-11 VITALS — BP 136/62 | HR 64 | Ht 65.5 in | Wt 157.3 lb

## 2013-05-11 DIAGNOSIS — I739 Peripheral vascular disease, unspecified: Secondary | ICD-10-CM

## 2013-05-11 DIAGNOSIS — E785 Hyperlipidemia, unspecified: Secondary | ICD-10-CM

## 2013-05-11 DIAGNOSIS — I480 Paroxysmal atrial fibrillation: Secondary | ICD-10-CM | POA: Insufficient documentation

## 2013-05-11 DIAGNOSIS — Z7901 Long term (current) use of anticoagulants: Secondary | ICD-10-CM | POA: Insufficient documentation

## 2013-05-11 DIAGNOSIS — I4891 Unspecified atrial fibrillation: Secondary | ICD-10-CM

## 2013-05-11 DIAGNOSIS — I2510 Atherosclerotic heart disease of native coronary artery without angina pectoris: Secondary | ICD-10-CM | POA: Insufficient documentation

## 2013-05-11 DIAGNOSIS — I340 Nonrheumatic mitral (valve) insufficiency: Secondary | ICD-10-CM

## 2013-05-11 HISTORY — DX: Nonrheumatic mitral (valve) insufficiency: I34.0

## 2013-05-11 HISTORY — DX: Atherosclerotic heart disease of native coronary artery without angina pectoris: I25.10

## 2013-05-11 MED ORDER — FLECAINIDE ACETATE 50 MG PO TABS: 50.0000 mg | ORAL_TABLET | Freq: Two times a day (BID) | ORAL | Status: DC

## 2013-05-11 NOTE — Assessment & Plan Note (Signed)
Echo pending to eval MR further

## 2013-05-11 NOTE — Assessment & Plan Note (Signed)
Stable

## 2013-05-11 NOTE — Assessment & Plan Note (Addendum)
Stable, will re check in 1 month

## 2013-05-11 NOTE — Assessment & Plan Note (Signed)
Cardiac cath with non obstructive disease.  Will follow lipids, plan to check in 1 month.

## 2013-05-11 NOTE — Progress Notes (Signed)
[facility]  05/11/2013   PCP: Samuel Jester, DO   Chief Complaint  Patient presents with  . post hospital    No cardiac complaints.  muscle cramping of lower legs and feet,occasional    Primary Cardiologist: Dr. Allyson Sabal  HPI:  62 y.o. female with known history of paroxysmal atrial fibrillation, hypertension, peripheral arterial disease status post stenting to lt external iliac and Rt SFA with known occluded Lt SFA, previous history of cigarette smoking, history of CVA was brought to the ER for palpitations and chest pressure. Patient stated that after working in the yard she started feeling funny and checked her blood pressure and she found it was low. Later she started developing chest pressure and palpitations and felt nauseated and vomitted once. She also became diaphoretic. This was much worse than her previous episodes of PAF. EMS was called and the patient was brought to the ER and EKG showed A. fib with RVR and patient was initially given Cardizem bolus after which patient's blood pressure dropped and was given IV fluids. Cardizem was discontinued. Patient's heart rate improved found to be in sinus rhythm with blood pressure in the 110 systolic. Blood work showed mild leukocytosis. + troponins 0.40 and second 1.21. EKG A fib with diffuse ischemia with RVR. R. Previously pt had been on coumadin but developed nosebleeds, has been on asprin.   Due to NSTEMI the patient had cardiac cath revealing nonocclusive CAD with mild to moderate calcified lesions in the ostial left main and proximal LAD with moderate proximal RCA and distal AV groove circumflex lesions. LV gram showed hyperdynamic left ventricular function with proximally 2+ mitral regurgitation. She started on flecainide 50 mg twice daily for rhythm control. In addition she was started on eliquis for stroke prophylaxis(CHADS2 score 2).   Here today she related that it took 3 days to receive the eliquis due  to prior authorization.  She is not on the Flecainide currently,  I did discuss with Dr. Herbie Baltimore and we will begin now.  No complaints today.  No chest pain, no SOB.  She is maintaining SR.  On her cath she had 2+ MR, and she is scheduled for outpatient ECHO.     Allergies  Allergen Reactions  . Atenolol   . Crestor (Rosuvastatin) Other (See Comments)    Itchy rash  . Demerol (Meperidine) Other (See Comments)    Unknown, can't remember   . Effexor (Venlafaxine Hcl)   . Inderal (Propranolol)   . Lovaza (Omega-3-Acid Ethyl Esters)   . Penicillins Other (See Comments)    Questionable high fever when taken and broke out in whelps.   . Percocet (Oxycodone-Acetaminophen)   . Plavix (Clopidogrel Bisulfate)   . Warfarin And Related Other (See Comments)    Caused nose bleeds  . Simvastatin Itching and Rash  . Trilipix (Choline Fenofibrate) Other (See Comments)    Current Outpatient Prescriptions  Medication Sig Dispense Refill  . acetaminophen (TYLENOL) 500 MG tablet Take 1,000 mg by mouth every 6 (six) hours as needed for pain.      Marland Kitchen apixaban (ELIQUIS) 5 MG TABS tablet Take 1 tablet (5 mg total) by mouth 2 (two) times daily.  60 tablet  5  . aspirin EC 81 MG tablet Take 81 mg by mouth daily.      Marland Kitchen diltiazem (CARDIZEM CD) 180 MG 24 hr capsule Take 180 mg by mouth every morning.      . ezetimibe (ZETIA) 10 MG tablet Take  10 mg by mouth at bedtime.      . fenofibrate (TRICOR) 145 MG tablet Take 145 mg by mouth at bedtime.      Marland Kitchen lisinopril (PRINIVIL,ZESTRIL) 10 MG tablet Take 5 mg by mouth every morning.      Marland Kitchen omeprazole (PRILOSEC) 20 MG capsule Take 20 mg by mouth 2 (two) times daily.      . polyethylene glycol (MIRALAX / GLYCOLAX) packet Take 17 g by mouth daily as needed (constipation).      . pravastatin (PRAVACHOL) 40 MG tablet Take 40 mg by mouth at bedtime.      . simethicone (MYLICON) 125 MG chewable tablet Chew 125 mg by mouth every 6 (six) hours as needed for flatulence.       . traMADol (ULTRAM) 50 MG tablet Take 50 mg by mouth every 6 (six) hours as needed for pain.      . flecainide (TAMBOCOR) 50 MG tablet Take 1 tablet (50 mg total) by mouth 2 (two) times daily.  60 tablet  5   No current facility-administered medications for this visit.    Past Medical History  Diagnosis Date  . Stroke 2006  . Hypertension   . PVD (peripheral vascular disease) with claudication     previous L ext iliac stent and rt SFA stent, known occluded Lt SFA  . Paroxysmal atrial fibrillation     hx of a fib on anticoagulation  . H/O cardiovascular stress test 12/27/2009    negative for ischemia  . NSTEMI (non-ST elevated myocardial infarction) 04/27/2013  . S/P cardiac cath 04/27/13    NON OBSTRUCTIVE DISEASE, 2+ mr, ef 65-70%  . CAD (coronary artery disease), per cath 04/27/13, non obstructive disease, EF 65-70% 05/11/2013  . Mitral regurgitation,2+ by cath 05/11/2013    Past Surgical History  Procedure Laterality Date  . Ovarian cyst removal    . Iliac artery stent  07/2006    external iliac and Rt SFA stent  . Cardiac catheterization  04/27/13    non occlusive disease with mild to mod. calcified lesions in ostial LM and proximal LAD and moderate prox. RC AND DISTAL AV GROOVE LCX, ef    ZOX:[id number]:UJ colds or fevers, no weight changes Skin:no rashes or ulcers HEENT:no blurred vision, no congestion CV:see HPI PUL:see HPI GI:no diarrhea constipation or melena, no indigestion GU:no hematuria, no dysuria MS:no joint pain, no claudication Neuro:no syncope, no lightheadedness Endo:no diabetes, no thyroid disease  PHYSICAL EXAM BP 136/62  Pulse 64  Ht 5' 5.5" (1.664 m)  Wt 157 lb 4.8 oz (71.351 kg)  BMI 25.77 kg/m2 General:Pleasant affect, NAD Skin:Warm and dry, brisk capillary refill HEENT:normocephalic, sclera clear, mucus membranes moist Neck:supple, no JVD, no bruits  Heart:S1S2 RRR without  gallup, rub or click, + soft systolic murmur Lungs:clear without rales,  rhonchi, or wheezes WJX:BJYN, non tender, + BS, do not palpate liver spleen or masses Ext:no lower ext edema, 2+ pedal pulses, 2+ radial pulses Neuro:alert and oriented, MAE, follows commands, + facial symmetry  EKG:SR rate of 64 no acute changes otherwise.  ASSESSMENT AND PLAN PAF (paroxysmal atrial fibrillation), maintaing SR Maintaining SR , will add back the flecainide to prevent A fib.  Instructed pt to call if fatigue or weakness, that her HR may slow with the meds.    Chronic anticoagulation, new- eliquis New on eliquis, no bleeding noted.  PAD (peripheral artery disease) Stable   Hyperlipidemia Stable, will re check in 1 month   NSTEMI (non-ST elevated myocardial infarction)  No further chest pain.  OK to garden, pacing herself.  CAD (coronary artery disease), per cath 04/27/13, non obstructive disease, EF 65-70% Cardiac cath with non obstructive disease.  Will follow lipids, plan to check in 1 month.  Mitral regurgitation,2+ by cath Echo pending to eval MR further

## 2013-05-11 NOTE — Assessment & Plan Note (Addendum)
Maintaining SR , will add back the flecainide to prevent A fib.  Instructed pt to call if fatigue or weakness, that her HR may slow with the meds.

## 2013-05-11 NOTE — Assessment & Plan Note (Signed)
>>  ASSESSMENT AND PLAN FOR MIXED HYPERLIPIDEMIA WRITTEN ON 05/11/2013 10:21 AM BY INGOLD, LAURA R, NP  Stable, will re check in 1 month

## 2013-05-11 NOTE — Patient Instructions (Addendum)
Have bloodwork drawn in one month 06/12/13, fasting after midnight the night before.  Follow-up with Dr. Allyson Sabal in 4 weeks, after 06/12/13.  Call if any bleeding.  Echocardiogram per schedule ASAP.  Start fecainide 50mg  take one tablet twice a day.  Call if experience increasing fatigue/tiredness.

## 2013-05-11 NOTE — Assessment & Plan Note (Signed)
No further chest pain.  OK to garden, pacing herself.

## 2013-05-11 NOTE — Assessment & Plan Note (Addendum)
New on eliquis, no bleeding noted.

## 2013-05-17 ENCOUNTER — Ambulatory Visit (HOSPITAL_COMMUNITY): Payer: PRIVATE HEALTH INSURANCE

## 2013-05-18 ENCOUNTER — Ambulatory Visit (HOSPITAL_COMMUNITY)
Admission: RE | Admit: 2013-05-18 | Discharge: 2013-05-18 | Disposition: A | Discharge status: Home / Self Care | Payer: PRIVATE HEALTH INSURANCE | Source: Ambulatory Visit | Attending: Cardiovascular Disease | Admitting: Cardiovascular Disease

## 2013-05-18 ENCOUNTER — Other Ambulatory Visit: Payer: Self-pay | Admitting: Cardiovascular Disease

## 2013-05-18 DIAGNOSIS — I214 Non-ST elevation (NSTEMI) myocardial infarction: Secondary | ICD-10-CM | POA: Insufficient documentation

## 2013-05-18 DIAGNOSIS — I4891 Unspecified atrial fibrillation: Secondary | ICD-10-CM | POA: Insufficient documentation

## 2013-05-18 NOTE — Progress Notes (Signed)
[facility]   2D echo completed 05/18/2013.   Cindy Ambrosio Reuter, RDCS  

## 2013-05-23 ENCOUNTER — Other Ambulatory Visit: Payer: Self-pay | Admitting: *Deleted

## 2013-05-23 MED ORDER — PRAVASTATIN SODIUM 40 MG PO TABS: 40.0000 mg | ORAL_TABLET | Freq: Every day | ORAL | Status: DC

## 2013-05-23 NOTE — Telephone Encounter (Signed)
RX refill for pravachol

## 2013-05-30 ENCOUNTER — Encounter (INDEPENDENT_AMBULATORY_CARE_PROVIDER_SITE_OTHER): Payer: Self-pay | Admitting: Surgery

## 2013-05-31 ENCOUNTER — Encounter: Payer: Self-pay | Admitting: Cardiovascular Disease

## 2013-05-31 ENCOUNTER — Ambulatory Visit (INDEPENDENT_AMBULATORY_CARE_PROVIDER_SITE_OTHER): Payer: PRIVATE HEALTH INSURANCE | Admitting: Cardiovascular Disease

## 2013-05-31 VITALS — BP 132/78 | HR 68 | Ht 65.5 in | Wt 159.0 lb

## 2013-05-31 DIAGNOSIS — I214 Non-ST elevation (NSTEMI) myocardial infarction: Secondary | ICD-10-CM

## 2013-05-31 DIAGNOSIS — I739 Peripheral vascular disease, unspecified: Secondary | ICD-10-CM

## 2013-05-31 NOTE — Assessment & Plan Note (Signed)
Patient was admitted to the hospital with chest pain and positive enzymes. She was in A. Fib with RVR. Dr. Bryan Lemma performed cardiac catheterization on her revealing nonobstructive disease with normal LV function. Medical therapy is recommended.

## 2013-05-31 NOTE — Assessment & Plan Note (Signed)
She has had left common and external iliac artery PTA  and stenting with a known occluded left SFA. She's had stenting of her right SFA as well. All of Dopplers revealed patent left iliac stents with moderate in-stent restenosis within the right SFA stent the patient denies claudication.

## 2013-05-31 NOTE — Patient Instructions (Addendum)
Your physician wants you to follow-up in: 6 months with an extender and 12 months with Dr Berry. You will receive a reminder letter in the mail two months in advance. If you don't receive a letter, please call our office to schedule the follow-up appointment.  

## 2013-05-31 NOTE — Progress Notes (Signed)
05/31/2013 [name]   1951/01/29  [id number]  Primary Physician Samuel Jester, DO Primary Cardiologist: Runell Gess MD Roseanne Reno   HPI:  The patient is a 62 year old, thin-appearing, widowed Caucasian female, mother of 2 children, grandmother to 5 grandchildren and 3 step-grandchildren who she has raised. I saw her 6 months ago. She has a history of PVOD status post left external iliac artery PTA and stenting as well as right SFA stenting by myself August 2007 with a known occluded left SFA. Her other problems include recently discontinued tobacco abuse November 11, 2012, hypertension and hyperlipidemia. She is statin intolerant. She has had PAF in the past and has been on Coumadin anticoagulation which was stopped because of nosebleeds and changed to aspirin. She really denies chest pain, shortness of breath, or claudication. Her last Myoview performed December 27, 2009, was nonischemic. Dopplers performed a year ago showed patent stents with right ABI of 0.87 and a left of 0.65. She was admitted to the hospital last month with chest pain and dizziness. She was found to be in A. Fib with RVR and multiple he converted. She had a cardiac catheterization the following day by Dr. Bryan Lemma revealing nonobstructive CAD with normal LV function.      Current Outpatient Prescriptions  Medication Sig Dispense Refill  . acetaminophen (TYLENOL) 500 MG tablet Take 1,000 mg by mouth every 6 (six) hours as needed for pain.      Marland Kitchen apixaban (ELIQUIS) 5 MG TABS tablet Take 1 tablet (5 mg total) by mouth 2 (two) times daily.  60 tablet  5  . aspirin EC 81 MG tablet Take 81 mg by mouth daily.      Marland Kitchen azithromycin (ZITHROMAX) 250 MG tablet       . desonide (DESOWEN) 0.05 % lotion       . diltiazem (CARDIZEM CD) 180 MG 24 hr capsule Take 180 mg by mouth every morning.      . fenofibrate (TRICOR) 145 MG tablet TAKE ONE TABLET BY MOUTH AT BEDTIME  30 tablet  11  . flecainide (TAMBOCOR)  50 MG tablet Take 1 tablet (50 mg total) by mouth 2 (two) times daily.  60 tablet  5  . lisinopril (PRINIVIL,ZESTRIL) 10 MG tablet Take 5 mg by mouth. 5mg  every Sunday, Tuesday, Thursday, and Saturday.  Nothing on the other days      . omeprazole (PRILOSEC) 20 MG capsule Take 20 mg by mouth 2 (two) times daily.      . polyethylene glycol (MIRALAX / GLYCOLAX) packet Take 17 g by mouth daily as needed (constipation).      . pravastatin (PRAVACHOL) 40 MG tablet Take 1 tablet (40 mg total) by mouth at bedtime.  30 tablet  11  . predniSONE (STERAPRED UNI-PAK) 10 MG tablet       . simethicone (MYLICON) 125 MG chewable tablet Chew 125 mg by mouth every 6 (six) hours as needed for flatulence.      . traMADol (ULTRAM) 50 MG tablet Take 50 mg by mouth every 6 (six) hours as needed for pain.      Marland Kitchen ZETIA 10 MG tablet TAKE ONE TABLET BY MOUTH AT BEDTIME  30 tablet  11   No current facility-administered medications for this visit.    Allergies  Allergen Reactions  . Atenolol   . Crestor (Rosuvastatin) Other (See Comments)    Itchy rash  . Demerol (Meperidine) Other (See Comments)    Unknown, can't remember   .  Effexor (Venlafaxine Hcl)   . Inderal (Propranolol)   . Lovaza (Omega-3-Acid Ethyl Esters)   . Penicillins Other (See Comments)    Questionable high fever when taken and broke out in whelps.   . Percocet (Oxycodone-Acetaminophen)   . Plavix (Clopidogrel Bisulfate)   . Warfarin And Related Other (See Comments)    Caused nose bleeds  . Simvastatin Itching and Rash    Also lipitor intolerant  . Trilipix (Choline Fenofibrate) Other (See Comments)    History   Social History  . Marital Status: Widowed    Spouse Name: N/A    Number of Children: N/A  . Years of Education: N/A   Occupational History  . Not on file.   Social History Main Topics  . Smoking status: Former Smoker    Quit date: 11/11/2012  . Smokeless tobacco: Never Used  . Alcohol Use: No  . Drug Use: No  . Sexually  Active: Not on file   Other Topics Concern  . Not on file   Social History Narrative  . No narrative on file     Review of Systems: General: negative for chills, fever, night sweats or weight changes.  Cardiovascular: negative for chest pain, dyspnea on exertion, edema, orthopnea, palpitations, paroxysmal nocturnal dyspnea or shortness of breath Dermatological: negative for rash Respiratory: negative for cough or wheezing Urologic: negative for hematuria Abdominal: negative for nausea, vomiting, diarrhea, bright red blood per rectum, melena, or hematemesis Neurologic: negative for visual changes, syncope, or dizziness All other systems reviewed and are otherwise negative except as noted above.    Blood pressure 132/78, pulse 68, height 5' 5.5" (1.664 m), weight 159 lb (72.122 kg).  General appearance: alert and no distress Neck: no adenopathy, no JVD, supple, symmetrical, trachea midline, thyroid not enlarged, symmetric, no tenderness/mass/nodules and soft bilateral carotid bruits Lungs: clear to auscultation bilaterally Heart: regular rate and rhythm, S1, S2 normal, no murmur, click, rub or gallop Extremities: extremities normal, atraumatic, no cyanosis or edema  EKG not performed today  ASSESSMENT AND PLAN:   NSTEMI (non-ST elevated myocardial infarction) Patient was admitted to the hospital with chest pain and positive enzymes. She was in A. Fib with RVR. Dr. Bryan Lemma performed cardiac catheterization on her revealing nonobstructive disease with normal LV function. Medical therapy is recommended.  PAD (peripheral artery disease) She has had left [name] and external iliac artery PTA  and stenting with a known occluded left SFA. She's had stenting of her right SFA as well. All of Dopplers revealed patent left iliac stents with moderate in-stent restenosis within the right SFA stent the patient denies claudication.      Runell Gess MD FACP,FACC,FAHA,  [facility] 05/31/2013 12:55 PM

## 2013-06-01 ENCOUNTER — Encounter: Payer: Self-pay | Admitting: Cardiovascular Disease

## 2013-06-06 ENCOUNTER — Encounter (INDEPENDENT_AMBULATORY_CARE_PROVIDER_SITE_OTHER): Payer: Self-pay | Admitting: Surgery

## 2013-06-06 ENCOUNTER — Ambulatory Visit (INDEPENDENT_AMBULATORY_CARE_PROVIDER_SITE_OTHER): Payer: Medicaid Other | Admitting: Surgery

## 2013-06-06 VITALS — BP 138/64 | HR 74 | Temp 98.2°F | Resp 18 | Ht 65.5 in | Wt 155.0 lb

## 2013-06-06 DIAGNOSIS — K644 Residual hemorrhoidal skin tags: Secondary | ICD-10-CM | POA: Insufficient documentation

## 2013-06-06 MED ORDER — HYDROCORTISONE 2.5 % RE CREA: TOPICAL_CREAM | Freq: Two times a day (BID) | RECTAL | Status: DC

## 2013-06-06 NOTE — Progress Notes (Signed)
Patient ID: [name], female   DOB: 1951/03/21, 62 y.o.   MRN: [id number]  Chief Complaint  Patient presents with  . New Evaluation    eval hems    HPI [name] is a 62 y.o. female.  Referred by Stacey Feeler, PA at [facility] for evaluation of hemorrhoids Cards - Dr. Allyson Sabal   HPI This is a 62 year old female who is one-month status post myocardial infarction. She is chronically anticoagulated with ELIQUIS.  She had had some issues with hemorrhoids for the last 4-5 months. The patient reports frequent constipation with bowel movements only 2 or 3 times a week. She denies any bleeding per rectum. She does have lower extremity stents in place.  Past Medical History  Diagnosis Date  . Stroke 2006  . Hypertension   . PVD (peripheral vascular disease) with claudication     previous L ext iliac stent and rt SFA stent, known occluded Lt SFA  . Paroxysmal atrial fibrillation     hx of a fib on anticoagulation  . H/O cardiovascular stress test 12/27/2009    negative for ischemia  . NSTEMI (non-ST elevated myocardial infarction) 04/27/2013  . S/P cardiac cath 04/27/13    NON OBSTRUCTIVE DISEASE, 2+ mr, ef 65-70%  . CAD (coronary artery disease), per cath 04/27/13, non obstructive disease, EF 65-70% 05/11/2013  . Mitral regurgitation,2+ by cath 05/11/2013    Past Surgical History  Procedure Laterality Date  . Ovarian cyst removal    . Iliac artery stent  07/2006    external iliac and Rt SFA stent  . Cardiac catheterization  04/27/13    non occlusive disease with mild to mod. calcified lesions in ostial LM and proximal LAD and moderate prox. RC AND DISTAL AV GROOVE LCX, ef    Family History  Problem Relation Age of Onset  . CAD Mother   . Lung cancer Mother   . Heart attack Mother   . CAD Father   . Heart disease Father   . Heart attack Father 48    died in hes 62's with heart disease  . Healthy Sister   . Liver cancer Brother   . Healthy Sister   . CAD Brother     has  had CABG  . CAD Brother     Social History History  Substance Use Topics  . Smoking status: Former Smoker    Quit date: 11/11/2012  . Smokeless tobacco: Never Used  . Alcohol Use: No    Allergies  Allergen Reactions  . Atenolol   . Crestor (Rosuvastatin) Other (See Comments)    Itchy rash  . Demerol (Meperidine) Other (See Comments)    Unknown, can't remember   . Effexor (Venlafaxine Hcl)   . Inderal (Propranolol)   . Lovaza (Omega-3-Acid Ethyl Esters)   . Penicillins Other (See Comments)    Questionable high fever when taken and broke out in whelps.   . Percocet (Oxycodone-Acetaminophen)   . Plavix (Clopidogrel Bisulfate)   . Warfarin And Related Other (See Comments)    Caused nose bleeds  . Simvastatin Itching and Rash    Also lipitor intolerant  . Trilipix (Choline Fenofibrate) Other (See Comments)    Current Outpatient Prescriptions  Medication Sig Dispense Refill  . acetaminophen (TYLENOL) 500 MG tablet Take 1,000 mg by mouth every 6 (six) hours as needed for pain.      Marland Kitchen apixaban (ELIQUIS) 5 MG TABS tablet Take 1 tablet (5 mg total) by mouth 2 (two)  times daily.  60 tablet  5  . aspirin EC 81 MG tablet Take 81 mg by mouth daily.      Marland Kitchen desonide (DESOWEN) 0.05 % lotion       . diltiazem (CARDIZEM CD) 180 MG 24 hr capsule Take 180 mg by mouth every morning.      . fenofibrate (TRICOR) 145 MG tablet TAKE ONE TABLET BY MOUTH AT BEDTIME  30 tablet  11  . flecainide (TAMBOCOR) 50 MG tablet Take 1 tablet (50 mg total) by mouth 2 (two) times daily.  60 tablet  5  . lisinopril (PRINIVIL,ZESTRIL) 10 MG tablet Take 5 mg by mouth. 5mg  every Sunday, Tuesday, Thursday, and Saturday.  Nothing on the other days      . omeprazole (PRILOSEC) 20 MG capsule Take 20 mg by mouth 2 (two) times daily.      . polyethylene glycol (MIRALAX / GLYCOLAX) packet Take 17 g by mouth daily as needed (constipation).      . pravastatin (PRAVACHOL) 40 MG tablet Take 1 tablet (40 mg total) by mouth at  bedtime.  30 tablet  11  . simethicone (MYLICON) 125 MG chewable tablet Chew 125 mg by mouth every 6 (six) hours as needed for flatulence.      . traMADol (ULTRAM) 50 MG tablet Take 50 mg by mouth every 6 (six) hours as needed for pain.      Marland Kitchen ZETIA 10 MG tablet TAKE ONE TABLET BY MOUTH AT BEDTIME  30 tablet  11  . hydrocortisone (ANUSOL-HC) 2.5 % rectal cream Place rectally 2 (two) times daily.  30 g  2   No current facility-administered medications for this visit.    Review of Systems Review of Systems  Constitutional: Negative for fever, chills and unexpected weight change.  HENT: Negative for hearing loss, congestion, sore throat, trouble swallowing and voice change.   Eyes: Negative for visual disturbance.  Respiratory: Negative for cough and wheezing.   Cardiovascular: Negative for chest pain, palpitations and leg swelling.  Gastrointestinal: Positive for rectal pain. Negative for nausea, vomiting, abdominal pain, diarrhea, constipation, blood in stool, abdominal distention and anal bleeding.  Genitourinary: Negative for hematuria, vaginal bleeding and difficulty urinating.  Musculoskeletal: Negative for arthralgias.  Skin: Negative for rash and wound.  Neurological: Negative for seizures, syncope and headaches.  Hematological: Negative for adenopathy. Does not bruise/bleed easily.  Psychiatric/Behavioral: Negative for confusion.    Blood pressure 138/64, pulse 74, temperature 98.2 F (36.8 C), temperature source Temporal, resp. rate 18, height 5' 5.5" (1.664 m), weight 155 lb (70.308 kg).  Physical Exam Physical Exam WDWN in NAD Rectal - The patient has 3 areas of loose external hemorrhoidal skin. These have become relatively large and seemed to be slowly pulling some internal mucosa to the outside. No inflammation or thrombosis noted. On digital rectal examination the patient has fairly small internal hemorrhoids. She has good sphincter tone.  Data  Reviewed None  Assessment    #1 Three external hemorrhoidal skin tags with no sign of complication  #2 Recent heart attack #3 chronic anticoagulation    Plan    We will try to avoid surgical intervention if possible.   Stool softeners, fiber supplement PRN Miralax to avoid constipation Anusol - HC ointment. Follow-up 4-6 weeks.         Felicite Zeimet K. 06/06/2013, 4:51 PM

## 2013-08-03 ENCOUNTER — Encounter (INDEPENDENT_AMBULATORY_CARE_PROVIDER_SITE_OTHER): Payer: Self-pay | Admitting: Surgery

## 2013-08-03 ENCOUNTER — Ambulatory Visit (INDEPENDENT_AMBULATORY_CARE_PROVIDER_SITE_OTHER): Payer: Medicare Other | Admitting: Surgery

## 2013-08-03 VITALS — BP 140/70 | HR 72 | Temp 97.6°F | Resp 16 | Ht 65.5 in | Wt 155.8 lb

## 2013-08-03 DIAGNOSIS — K644 Residual hemorrhoidal skin tags: Secondary | ICD-10-CM

## 2013-08-03 NOTE — Progress Notes (Signed)
Follow up of her external hemorrhoids. She has been avoiding constipation. She feels that the hemorrhoids have become smaller. Occasionally she gets some irritation after bowel movements but this resolves with the use of Anusol. Once a while cc low but of blood on the toilet paper. Most of her tenderness and irritation his posterior.  She still has 3 residual external hemorrhoidal skin tags. However there is no significant hemorrhoid disease. No swelling. No sign of fissure.  She should continue her current bowel regimen. She should add the use of witch hazel wipes after bowel movements. Continue with Anusol as needed for irritation. Followup in 4-6 weeks for reevaluation.  Wilmon Arms. Corliss Skains, MD, [facility] Surgery  General/ Trauma Surgery  08/03/2013 9:11 AM

## 2013-08-03 NOTE — Patient Instructions (Addendum)
Continue with stool softeners as needed Use witch hazel wipes (Tucks pads) after bowel movements as well as the anusol ointment.

## 2013-09-05 ENCOUNTER — Encounter (INDEPENDENT_AMBULATORY_CARE_PROVIDER_SITE_OTHER): Payer: Medicare Other | Admitting: Surgery

## 2013-09-20 DIAGNOSIS — I779 Disorder of arteries and arterioles, unspecified: Secondary | ICD-10-CM

## 2013-09-20 HISTORY — DX: Disorder of arteries and arterioles, unspecified: I77.9

## 2013-10-11 ENCOUNTER — Other Ambulatory Visit (HOSPITAL_COMMUNITY): Payer: Self-pay | Admitting: Cardiovascular Disease

## 2013-10-11 DIAGNOSIS — Z8679 Personal history of other diseases of the circulatory system: Secondary | ICD-10-CM

## 2013-10-17 ENCOUNTER — Ambulatory Visit (HOSPITAL_COMMUNITY)
Admission: RE | Admit: 2013-10-17 | Discharge: 2013-10-17 | Disposition: A | Discharge status: Home / Self Care | Payer: PRIVATE HEALTH INSURANCE | Source: Ambulatory Visit | Attending: Cardiovascular Disease | Admitting: Cardiovascular Disease

## 2013-10-17 DIAGNOSIS — Z8679 Personal history of other diseases of the circulatory system: Secondary | ICD-10-CM

## 2013-10-17 DIAGNOSIS — I6529 Occlusion and stenosis of unspecified carotid artery: Secondary | ICD-10-CM | POA: Insufficient documentation

## 2013-10-17 NOTE — Progress Notes (Signed)
Carotid Duplex Completed. °Brianna L Mazza,RVT °

## 2013-10-21 ENCOUNTER — Other Ambulatory Visit: Payer: Self-pay | Admitting: Cardiology

## 2013-10-23 NOTE — Telephone Encounter (Signed)
Rx was sent to pharmacy electronically. 

## 2013-10-24 ENCOUNTER — Encounter: Payer: Self-pay | Admitting: *Deleted

## 2013-10-24 ENCOUNTER — Telehealth: Payer: Self-pay | Admitting: *Deleted

## 2013-10-24 DIAGNOSIS — I6522 Occlusion and stenosis of left carotid artery: Secondary | ICD-10-CM

## 2013-10-24 NOTE — Telephone Encounter (Signed)
Message copied by Marella Bile on Tue Oct 24, 2013  4:10 PM ------      Message from: Runell Gess      Created: Fri Oct 20, 2013  9:41 AM       Mild progression of disease left internal carotid artery. Repeat in 6 months ------

## 2013-10-24 NOTE — Telephone Encounter (Signed)
Order placed for repeat carotid doppler in 6 months 

## 2013-10-26 ENCOUNTER — Other Ambulatory Visit: Payer: Self-pay

## 2013-10-31 ENCOUNTER — Encounter: Payer: Self-pay | Admitting: *Deleted

## 2013-10-31 ENCOUNTER — Telehealth: Payer: Self-pay | Admitting: Cardiovascular Disease

## 2013-10-31 MED ORDER — APIXABAN 5 MG PO TABS: 5.0000 mg | ORAL_TABLET | Freq: Two times a day (BID) | ORAL | Status: DC

## 2013-10-31 NOTE — Telephone Encounter (Signed)
Returned call and pt verified x 2.  Pt stated she requested a refill for Eliquis on Saturday and Sunday and pharmacy said they have not heard anything back.  Informed no request received and RN will send now.  Pt verbalized understanding and agreed w/ plan.  Refill(s) sent to pharmacy.

## 2013-10-31 NOTE — Telephone Encounter (Signed)
Still have not received her Eliqui.

## 2013-11-06 ENCOUNTER — Encounter: Payer: Self-pay | Admitting: Cardiology

## 2013-11-07 ENCOUNTER — Ambulatory Visit: Payer: PRIVATE HEALTH INSURANCE | Admitting: Cardiology

## 2013-11-08 ENCOUNTER — Ambulatory Visit (INDEPENDENT_AMBULATORY_CARE_PROVIDER_SITE_OTHER): Payer: PRIVATE HEALTH INSURANCE | Admitting: Cardiology

## 2013-11-08 ENCOUNTER — Encounter: Payer: Self-pay | Admitting: Cardiology

## 2013-11-08 VITALS — BP 158/68 | HR 67 | Ht 65.0 in | Wt 163.0 lb

## 2013-11-08 DIAGNOSIS — I059 Rheumatic mitral valve disease, unspecified: Secondary | ICD-10-CM

## 2013-11-08 DIAGNOSIS — I480 Paroxysmal atrial fibrillation: Secondary | ICD-10-CM

## 2013-11-08 DIAGNOSIS — I4891 Unspecified atrial fibrillation: Secondary | ICD-10-CM

## 2013-11-08 DIAGNOSIS — D72829 Elevated white blood cell count, unspecified: Secondary | ICD-10-CM

## 2013-11-08 DIAGNOSIS — I739 Peripheral vascular disease, unspecified: Secondary | ICD-10-CM

## 2013-11-08 DIAGNOSIS — Z7901 Long term (current) use of anticoagulants: Secondary | ICD-10-CM

## 2013-11-08 DIAGNOSIS — E785 Hyperlipidemia, unspecified: Secondary | ICD-10-CM

## 2013-11-08 DIAGNOSIS — I340 Nonrheumatic mitral (valve) insufficiency: Secondary | ICD-10-CM

## 2013-11-08 NOTE — Assessment & Plan Note (Signed)
>>  ASSESSMENT AND PLAN FOR MIXED HYPERLIPIDEMIA WRITTEN ON 11/08/2013  9:20 AM BY MARYLU DOFFING R, NP  In May LDL was 79.

## 2013-11-08 NOTE — Assessment & Plan Note (Signed)
Will need lower ext art dopplers in first of year.  Claudication is improving with exercise.

## 2013-11-08 NOTE — Progress Notes (Signed)
11/08/2013   PCP: Samuel Jester, DO   Chief Complaint  Patient presents with  . Follow-up    6 month visit    Primary Cardiologist: Dr. Allyson Sabal  HPI:  62 year old widowed female with a history of PVOD status post left external iliac artery PTA and stenting as well as right SFA stenting by myself August 2007 with a known occluded left SFA. Her other problems include discontinued tobacco abuse November 11, 2012, hypertension and hyperlipidemia. She is statin intolerant. She has had PAF in the past and has been on Coumadin anticoagulation which was stopped because of nosebleeds and changed to aspirin.  Also hx of CVA.  In May 2014 she developed chest pain and had cardiac cath with non obst. Disease.  She was in A. fib with RVR and patient was initially given Cardizem bolus after which patient's blood pressure dropped and was given IV fluids. Cardizem was discontinued. Patient's heart rate improved found to be in sinus rhythm with blood pressure in the 110 systolic. Blood work showed mild leukocytosis. + troponins 0.40 and second 1.21. EKG A fib with diffuse ischemia with RVR.  She was placed on Eliquis.   Cath also revealed 2+ MR but follow up echo revealed mild MR.  EF 55-65%.     Today she is back for 6 month follow up.  No chest pain, no SOB.  She is exercising and has less claudication.  She can walk further before pain occurs.  She plans to increase exercise after the first of the year.  She has been eating healthy also. Last cholesterol in May with LDL of 79.  Followed by PCP.   Allergies  Allergen Reactions  . Atenolol   . Crestor [Rosuvastatin] Other (See Comments)    Itchy rash  . Demerol [Meperidine] Other (See Comments)    Unknown, can't remember   . Effexor [Venlafaxine Hcl]   . Inderal [Propranolol]   . Lovaza [Omega-3-Acid Ethyl Esters]   . Penicillins Other (See Comments)    Questionable high fever when taken and broke out in whelps.   . Percocet  [Oxycodone-Acetaminophen]   . Plavix [Clopidogrel Bisulfate]   . Warfarin And Related Other (See Comments)    Caused nose bleeds  . Simvastatin Itching and Rash    Also lipitor intolerant  . Trilipix [Choline Fenofibrate] Other (See Comments)    Current Outpatient Prescriptions  Medication Sig Dispense Refill  . acetaminophen (TYLENOL) 500 MG tablet Take 1,000 mg by mouth every 6 (six) hours as needed for pain.      Marland Kitchen apixaban (ELIQUIS) 5 MG TABS tablet Take 1 tablet (5 mg total) by mouth 2 (two) times daily.  60 tablet  5  . desonide (DESOWEN) 0.05 % lotion       . diltiazem (CARDIZEM CD) 180 MG 24 hr capsule Take 180 mg by mouth every morning.      . fenofibrate (TRICOR) 145 MG tablet TAKE ONE TABLET BY MOUTH AT BEDTIME  30 tablet  11  . flecainide (TAMBOCOR) 50 MG tablet TAKE ONE TABLET BY MOUTH TWICE DAILY  60 tablet  6  . hydrocortisone (ANUSOL-HC) 2.5 % rectal cream Place rectally 2 (two) times daily.  30 g  2  . omeprazole (PRILOSEC) 20 MG capsule Take 20 mg by mouth 2 (two) times daily.      . polyethylene glycol (MIRALAX / GLYCOLAX) packet Take 17 g by mouth daily as needed (constipation).      Marland Kitchen  pravastatin (PRAVACHOL) 40 MG tablet Take 1 tablet (40 mg total) by mouth at bedtime.  30 tablet  11  . simethicone (MYLICON) 125 MG chewable tablet Chew 125 mg by mouth every 6 (six) hours as needed for flatulence.      . traMADol (ULTRAM) 50 MG tablet Take 50 mg by mouth every 6 (six) hours as needed for pain.      Marland Kitchen ZETIA 10 MG tablet TAKE ONE TABLET BY MOUTH AT BEDTIME  30 tablet  11  . aspirin EC 81 MG tablet Take 81 mg by mouth daily.       No current facility-administered medications for this visit.    Past Medical History  Diagnosis Date  . Stroke 2006  . Hypertension   . PVD (peripheral vascular disease) with claudication 07/2006    previous L ext iliac stent and rt SFA stent, known occluded Lt SFA  . Paroxysmal atrial fibrillation     hx of a fib on ASA akibe due to nose  bleeds on coumadin  . H/O cardiovascular stress test 12/27/2009    negative for ischemia  . NSTEMI (non-ST elevated myocardial infarction) 04/27/2013  . S/P cardiac cath 04/27/13    NON OBSTRUCTIVE DISEASE, 2+ mr, ef 65-70%  . CAD (coronary artery disease), per cath 04/27/13, non obstructive disease, EF 65-70% 05/11/2013  . Mitral regurgitation,2+ by cath 05/11/2013  . Statin intolerance   . Carotid disease, bilateral 09/2013    Lt carotid 50-69% stentosis, less on rt    Past Surgical History  Procedure Laterality Date  . Ovarian cyst removal    . Iliac artery stent  07/2006    external iliac and Rt SFA stent  . Cardiac catheterization  04/27/13    non occlusive disease with mild to mod. calcified lesions in ostial LM and proximal LAD and moderate prox. RC AND DISTAL AV GROOVE LCX, ef    ZOX:WRUEAVW:UJ colds or fevers, no weight changes Skin:no rashes or ulcers HEENT:no blurred vision, no congestion CV:see HPI PUL:see HPI GI:no diarrhea constipation or melena, no indigestion GU:no hematuria, no dysuria MS:no joint pain, no claudication Neuro:no syncope, no lightheadedness Endo:no diabetes, no thyroid disease  PHYSICAL EXAM BP 158/68  Pulse 67  Ht 5\' 5"  (1.651 m)  Wt 163 lb (73.936 kg)  BMI 27.12 kg/m2 General:Pleasant affect, NAD Skin:Warm and dry, brisk capillary refill HEENT:normocephalic, sclera clear, mucus membranes moist Neck:supple, no JVD, soft bil carotid bruits  Heart:S1S2 RRR without murmur, gallup, rub or click Lungs:clear without rales, rhonchi, or wheezes WJX:BJYN, non tender, + BS, do not palpate liver spleen or masses Ext:no lower ext edema, 2+ pedal pulses, 2+ radial pulses Neuro:alert and oriented, MAE, follows commands, + facial symmetry  EKG:SR no acute changes from previous.  ASSESSMENT AND PLAN PAF (paroxysmal atrial fibrillation), maintaing SR Continues to maintain SR, no awareness of atrial fib.  Chronic anticoagulation, new- eliquis No complaints of  bleeding.  Mitral regurgitation,2+ by cath No SOB   Leucocytosis Recent labs in May with LDL of 79  Hyperlipidemia In May LDL was 79.  PAD (peripheral artery disease), Lt carotid 50-70%: , Hx stent Lt exteral iliac & RSFA stent, known occl. LSFA  Will need lower ext art dopplers in first of year.  Claudication is improving with exercise.    We discussed her carotid dopplers.  She is to have LEA dopplers the first of the year.

## 2013-11-08 NOTE — Assessment & Plan Note (Signed)
No SOB 

## 2013-11-08 NOTE — Assessment & Plan Note (Signed)
No complaints of bleeding.

## 2013-11-08 NOTE — Assessment & Plan Note (Signed)
In May LDL was 79.

## 2013-11-08 NOTE — Assessment & Plan Note (Signed)
Recent labs in May with LDL of 79

## 2013-11-08 NOTE — Assessment & Plan Note (Signed)
Continues to maintain SR, no awareness of atrial fib.

## 2013-11-08 NOTE — Patient Instructions (Signed)
Keep up the good work with exercise and diet.  Follow up with Dr. Allyson Sabal in 6 months.  Call if any problems in the mean time.

## 2013-11-13 ENCOUNTER — Other Ambulatory Visit: Payer: Self-pay | Admitting: Cardiovascular Disease

## 2013-12-29 ENCOUNTER — Telehealth: Payer: Self-pay | Admitting: *Deleted

## 2013-12-29 NOTE — Telephone Encounter (Signed)
Fax received from [place] requesting clearance for knee surgery. Dr [name] reviewed the chart and gave clearance.  Form was faxed back to [facility]

## 2014-03-13 ENCOUNTER — Telehealth (HOSPITAL_COMMUNITY): Payer: Self-pay | Admitting: *Deleted

## 2014-03-14 ENCOUNTER — Encounter (HOSPITAL_COMMUNITY): Payer: Self-pay | Admitting: Pharmacy Technician

## 2014-03-15 ENCOUNTER — Ambulatory Visit (HOSPITAL_COMMUNITY)
Admission: RE | Admit: 2014-03-15 | Discharge: 2014-03-15 | Disposition: A | Discharge status: Home / Self Care | Payer: PRIVATE HEALTH INSURANCE | Source: Ambulatory Visit | Attending: Cardiovascular Disease | Admitting: Cardiovascular Disease

## 2014-03-15 DIAGNOSIS — I6529 Occlusion and stenosis of unspecified carotid artery: Secondary | ICD-10-CM

## 2014-03-15 DIAGNOSIS — R42 Dizziness and giddiness: Secondary | ICD-10-CM

## 2014-03-15 DIAGNOSIS — I6522 Occlusion and stenosis of left carotid artery: Secondary | ICD-10-CM

## 2014-03-15 DIAGNOSIS — I69992 Facial weakness following unspecified cerebrovascular disease: Secondary | ICD-10-CM

## 2014-03-15 NOTE — Progress Notes (Signed)
Carotid Duplex Completed. Rosaria Kubin, BS, RDMS, RVT  

## 2014-03-20 ENCOUNTER — Ambulatory Visit (HOSPITAL_COMMUNITY)
Admission: RE | Admit: 2014-03-20 | Discharge: 2014-03-20 | Disposition: A | Discharge status: Home / Self Care | Payer: PRIVATE HEALTH INSURANCE | Source: Ambulatory Visit | Attending: Orthopedic Surgery | Admitting: Orthopedic Surgery

## 2014-03-20 ENCOUNTER — Encounter (HOSPITAL_COMMUNITY): Payer: Self-pay

## 2014-03-20 ENCOUNTER — Telehealth: Payer: Self-pay | Admitting: *Deleted

## 2014-03-20 ENCOUNTER — Encounter (HOSPITAL_COMMUNITY)
Admission: RE | Admit: 2014-03-20 | Discharge: 2014-03-20 | Disposition: A | Discharge status: Home / Self Care | Payer: PRIVATE HEALTH INSURANCE | Source: Ambulatory Visit | Attending: Orthopedic Surgery | Admitting: Orthopedic Surgery

## 2014-03-20 DIAGNOSIS — Z01818 Encounter for other preprocedural examination: Secondary | ICD-10-CM | POA: Insufficient documentation

## 2014-03-20 DIAGNOSIS — I6529 Occlusion and stenosis of unspecified carotid artery: Secondary | ICD-10-CM

## 2014-03-20 DIAGNOSIS — Z01812 Encounter for preprocedural laboratory examination: Secondary | ICD-10-CM | POA: Insufficient documentation

## 2014-03-20 HISTORY — DX: Pain, unspecified: R52

## 2014-03-20 HISTORY — DX: Cardiac arrhythmia, unspecified: I49.9

## 2014-03-20 HISTORY — DX: Gastro-esophageal reflux disease without esophagitis: K21.9

## 2014-03-20 HISTORY — DX: Hyperlipidemia, unspecified: E78.5

## 2014-03-20 LAB — BASIC METABOLIC PANEL
BUN: 19 mg/dL (ref 6–23)
CHLORIDE: 101 meq/L (ref 96–112)
CO2: 29 mEq/L (ref 19–32)
CREATININE: 1 mg/dL (ref 0.50–1.10)
Calcium: 10 mg/dL (ref 8.4–10.5)
GFR calc non Af Amer: 59 mL/min — ABNORMAL LOW (ref >90)
GFR, EST AFRICAN AMERICAN: 68 mL/min — AB (ref >90)
Glucose, Bld: 84 mg/dL (ref 70–99)
POTASSIUM: 5.1 meq/L (ref 3.7–5.3)
Sodium: 141 mEq/L (ref 137–147)

## 2014-03-20 LAB — APTT: APTT: 56 s — AB (ref 24–37)

## 2014-03-20 LAB — PROTIME-INR
INR: 1.38 (ref 0.00–1.49)
Prothrombin Time: 16.6 seconds — ABNORMAL HIGH (ref 11.6–15.2)

## 2014-03-20 LAB — CBC
HEMATOCRIT: 42.3 % (ref 36.0–46.0)
Hemoglobin: 14.1 g/dL (ref 12.0–15.0)
MCH: 30.1 pg (ref 26.0–34.0)
MCHC: 33.3 g/dL (ref 30.0–36.0)
MCV: 90.2 fL (ref 78.0–100.0)
Platelets: 329 10*3/uL (ref 150–400)
RBC: 4.69 MIL/uL (ref 3.87–5.11)
RDW: 15.7 % — AB (ref 11.5–15.5)
WBC: 10.7 10*3/uL — AB (ref 4.0–10.5)

## 2014-03-20 IMAGING — CR DG CHEST 2V
2 series · 2 of 2 positions shown · non-contrast
Comparison: DG CHEST 1V PORT dated [DATE]

CLINICAL DATA: Preoperative exam prior to knee arthroscopy

EXAM:
CHEST  2 VIEW

[w chest pa]
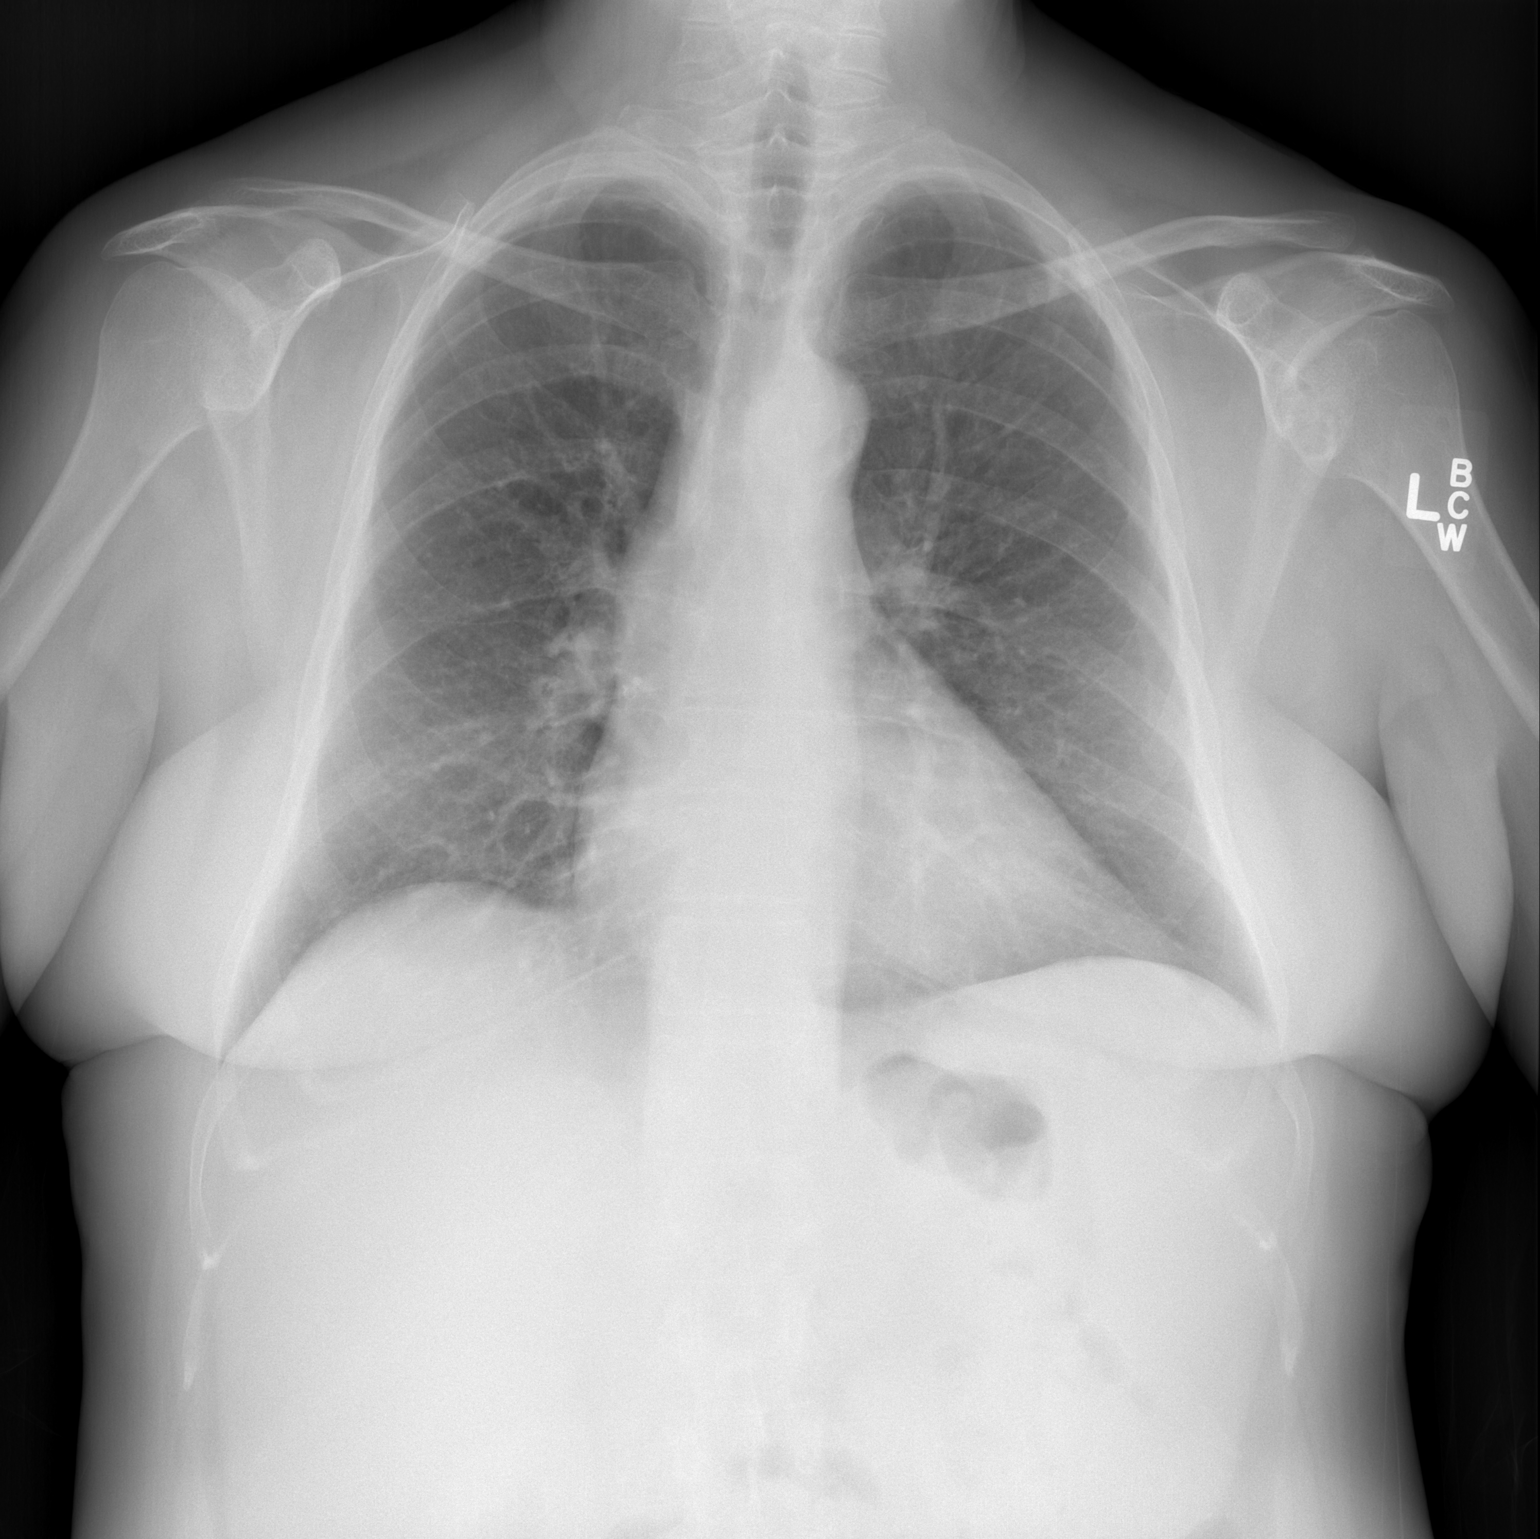

[w chest lat]
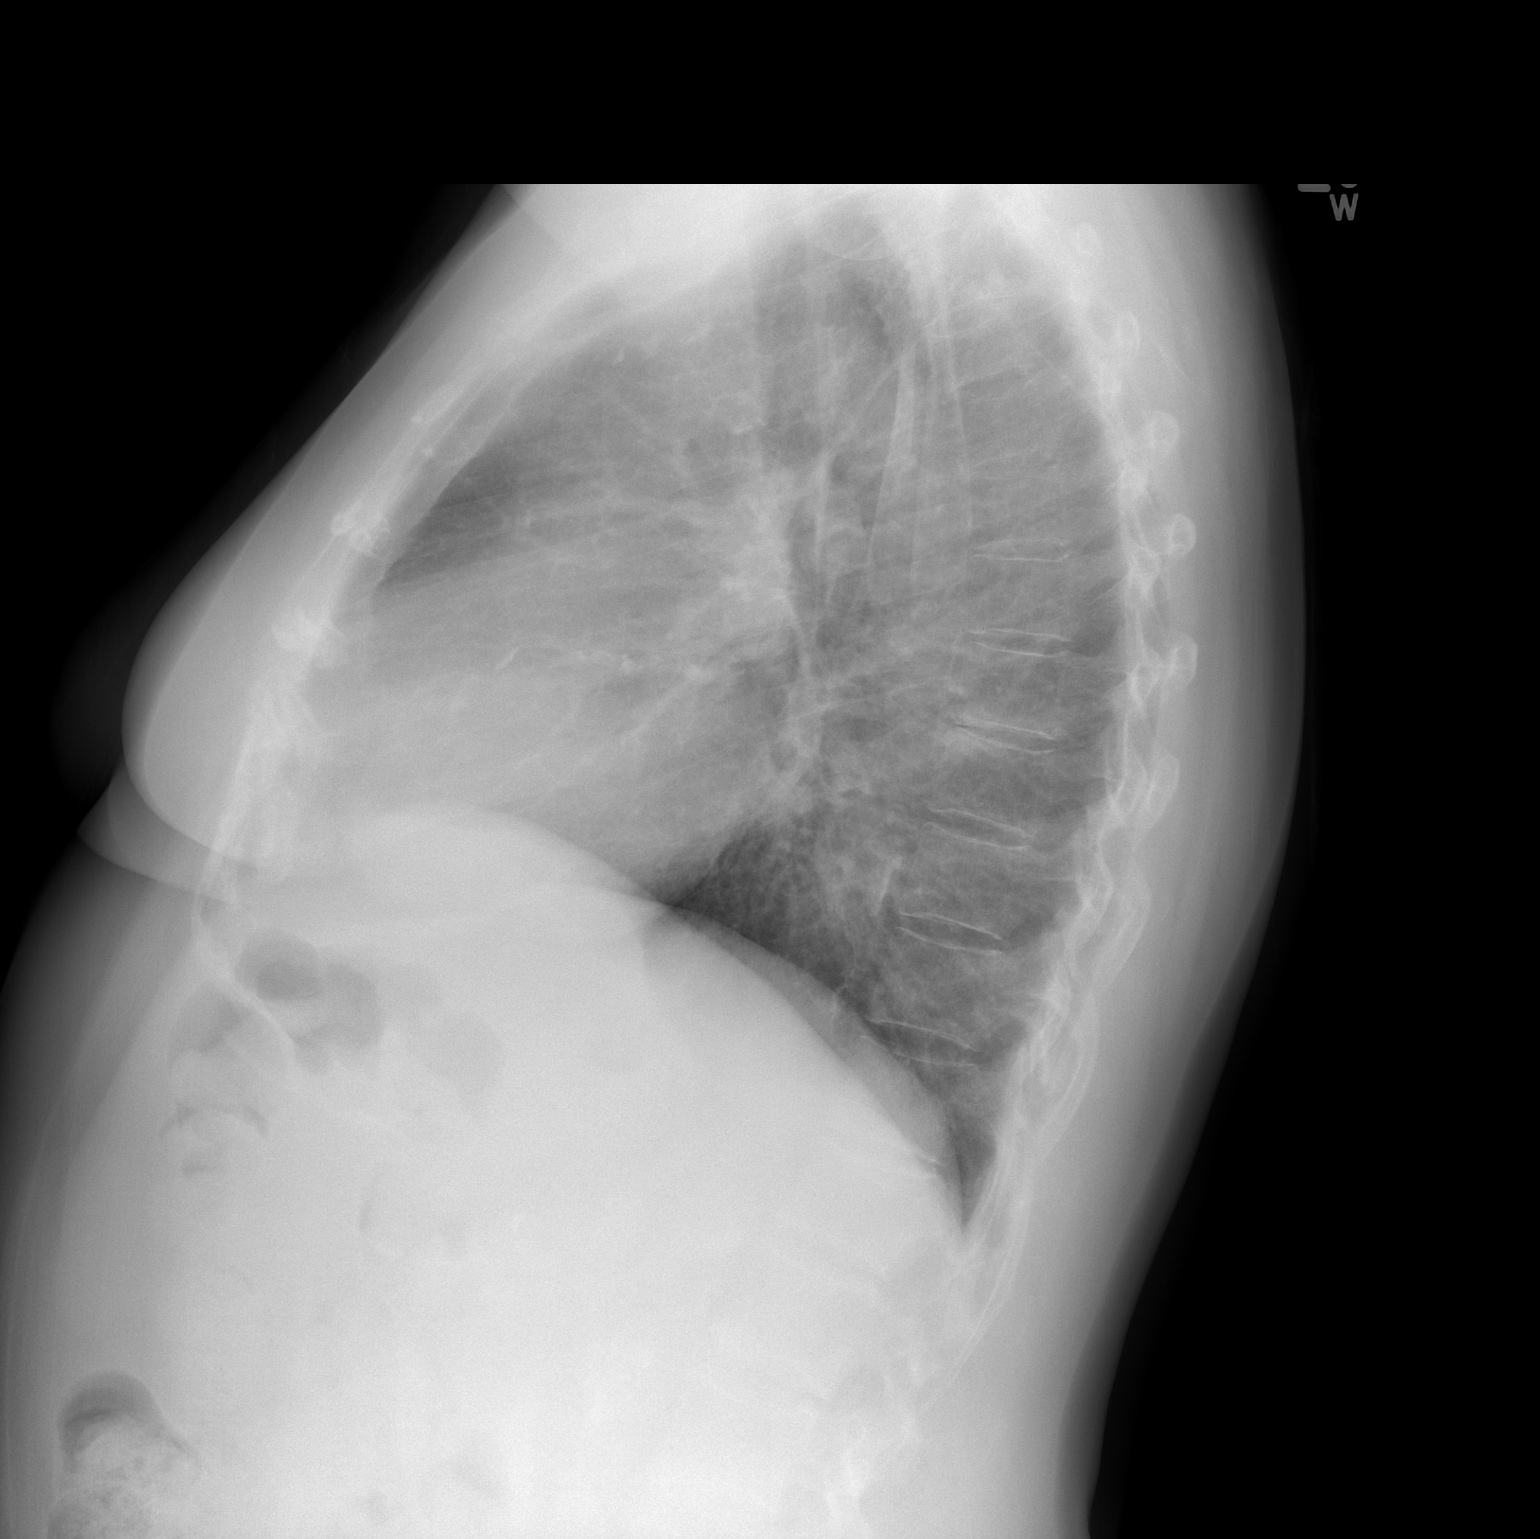

[2 of 2 positions shown; findings below may reference images not displayed]

FINDINGS: The lungs are adequately inflated and clear. The cardiac silhouette
is normal in size. The pulmonary vascularity is not engorged. The
mediastinum is normal in width. There is no pleural effusion. The
observed portions of the bony thorax exhibit no acute abnormalities.
IMPRESSION: There is no evidence of active cardiopulmonary disease.

## 2014-03-20 NOTE — Patient Instructions (Signed)
   YOUR SURGERY IS SCHEDULED AT [facility]  ON:  Tuesday  4/7  REPORT TO  SHORT STAY CENTER AT:  10:00 AM      PHONE # FOR SHORT STAY IS [phone]  DO NOT EAT OR DRINK ANYTHING AFTER MIDNIGHT THE NIGHT BEFORE YOUR SURGERY.  YOU MAY BRUSH YOUR TEETH, RINSE OUT YOUR MOUTH--BUT NO WATER, NO FOOD, NO CHEWING GUM, NO MINTS, NO CANDIES, NO CHEWING TOBACCO.  PLEASE TAKE THE FOLLOWING MEDICATIONS THE AM OF YOUR SURGERY WITH A FEW SIPS OF WATER:  DILTIAZEM, FLECAINIDE, OMEPRAZOLE.  TRAMADOL IF NEEDED FOR PAIN.   DO NOT BRING VALUABLES, MONEY, CREDIT CARDS.  DO NOT WEAR JEWELRY, MAKE-UP, NAIL POLISH AND NO METAL PINS OR CLIPS IN YOUR HAIR. CONTACT LENS, DENTURES / PARTIALS, GLASSES SHOULD NOT BE WORN TO SURGERY AND IN MOST CASES-HEARING AIDS WILL NEED TO BE REMOVED.  BRING YOUR GLASSES CASE, ANY EQUIPMENT NEEDED FOR YOUR CONTACT LENS. FOR PATIENTS ADMITTED TO THE HOSPITAL--CHECK OUT TIME THE DAY OF DISCHARGE IS 11:00 AM.  ALL INPATIENT ROOMS ARE PRIVATE - WITH BATHROOM, TELEPHONE, TELEVISION AND WIFI INTERNET.  IF YOU ARE BEING DISCHARGED THE SAME DAY OF YOUR SURGERY--YOU CAN NOT DRIVE YOURSELF HOME--AND SHOULD NOT GO HOME ALONE BY TAXI OR BUS.  NO DRIVING OR OPERATING MACHINERY, OR MAKING LEGAL DECISIONS FOR 24 HOURS FOLLOWING ANESTHESIA / PAIN MEDICATIONS.  PLEASE MAKE ARRANGEMENTS FOR SOMEONE TO BE WITH YOU AT HOME THE FIRST 24 HOURS AFTER SURGERY. RESPONSIBLE DRIVER'S NAME / PHONE                                                   PLEASE READ OVER ANY  FACT SHEETS THAT YOU WERE GIVEN:  BLOOD TRANSFUSION INFORMATION  FAILURE TO FOLLOW THESE INSTRUCTIONS MAY RESULT IN THE CANCELLATION OF YOUR SURGERY. PLEASE BE AWARE THAT YOU MAY NEED ADDITIONAL BLOOD DRAWN DAY OF YOUR SURGERY  PATIENT SIGNATURE_________________________________

## 2014-03-20 NOTE — Telephone Encounter (Signed)
Order placed for repeat carotid dopplers in 6 months  

## 2014-03-20 NOTE — Pre-Procedure Instructions (Addendum)
EKG REPORT IN EPIC FROM 11-08-13 AND CARDIOLOGY OFFICE NOTE [place]. CARDIAC CLEARANCE NOTE ON CHART FROM DR. BERRY. CXR REPEATED TODAY PREOP AT [facility] - PREVIOUS CXR REPORT IN EPIC FROM 04-26-13 ABNORMAL.

## 2014-03-20 NOTE — Telephone Encounter (Signed)
Message copied by Chauncy Lean on Tue Mar 20, 2014  9:56 PM ------      Message from: Lorretta Harp      Created: Mon Mar 19, 2014  6:59 PM       No change from prior study. Repeat in 6 months ------

## 2014-03-21 NOTE — Pre-Procedure Instructions (Signed)
PT'S PTT & PT ELEVATED BUT INR WITHIN NORMALS- REPORTS WERE FAXED TO [facility] AND SHE CALLED BACK 03/20/14 AND SAID SHE REVIEWED ABNORMAL LABS - OK FOR SURGERY

## 2014-03-27 ENCOUNTER — Encounter (HOSPITAL_COMMUNITY)
Admission: RE | Disposition: A | Discharge status: Home / Self Care | Payer: Self-pay | Source: Ambulatory Visit | Attending: Orthopedic Surgery

## 2014-03-27 ENCOUNTER — Encounter (HOSPITAL_COMMUNITY): Payer: Self-pay | Admitting: *Deleted

## 2014-03-27 ENCOUNTER — Ambulatory Visit (HOSPITAL_COMMUNITY)
Admission: RE | Admit: 2014-03-27 | Discharge: 2014-03-27 | Disposition: A | Discharge status: Home / Self Care | Payer: PRIVATE HEALTH INSURANCE | Source: Ambulatory Visit | Attending: Orthopedic Surgery | Admitting: Orthopedic Surgery

## 2014-03-27 ENCOUNTER — Ambulatory Visit (HOSPITAL_COMMUNITY): Payer: PRIVATE HEALTH INSURANCE | Admitting: Anesthesiology

## 2014-03-27 ENCOUNTER — Encounter (HOSPITAL_COMMUNITY): Payer: PRIVATE HEALTH INSURANCE | Admitting: Anesthesiology

## 2014-03-27 DIAGNOSIS — K219 Gastro-esophageal reflux disease without esophagitis: Secondary | ICD-10-CM | POA: Insufficient documentation

## 2014-03-27 DIAGNOSIS — Z8673 Personal history of transient ischemic attack (TIA), and cerebral infarction without residual deficits: Secondary | ICD-10-CM | POA: Insufficient documentation

## 2014-03-27 DIAGNOSIS — I059 Rheumatic mitral valve disease, unspecified: Secondary | ICD-10-CM | POA: Insufficient documentation

## 2014-03-27 DIAGNOSIS — E785 Hyperlipidemia, unspecified: Secondary | ICD-10-CM | POA: Insufficient documentation

## 2014-03-27 DIAGNOSIS — I739 Peripheral vascular disease, unspecified: Secondary | ICD-10-CM | POA: Insufficient documentation

## 2014-03-27 DIAGNOSIS — M1712 Unilateral primary osteoarthritis, left knee: Secondary | ICD-10-CM | POA: Diagnosis present

## 2014-03-27 DIAGNOSIS — M1710 Unilateral primary osteoarthritis, unspecified knee: Secondary | ICD-10-CM | POA: Insufficient documentation

## 2014-03-27 DIAGNOSIS — I2510 Atherosclerotic heart disease of native coronary artery without angina pectoris: Secondary | ICD-10-CM | POA: Insufficient documentation

## 2014-03-27 DIAGNOSIS — I4891 Unspecified atrial fibrillation: Secondary | ICD-10-CM | POA: Insufficient documentation

## 2014-03-27 DIAGNOSIS — I252 Old myocardial infarction: Secondary | ICD-10-CM | POA: Insufficient documentation

## 2014-03-27 DIAGNOSIS — Z79899 Other long term (current) drug therapy: Secondary | ICD-10-CM | POA: Insufficient documentation

## 2014-03-27 DIAGNOSIS — I10 Essential (primary) hypertension: Secondary | ICD-10-CM | POA: Insufficient documentation

## 2014-03-27 DIAGNOSIS — Z87891 Personal history of nicotine dependence: Secondary | ICD-10-CM | POA: Insufficient documentation

## 2014-03-27 HISTORY — PX: KNEE ARTHROSCOPY WITH MEDIAL MENISECTOMY: SHX5651

## 2014-03-27 SURGERY — ARTHROSCOPY, KNEE, WITH MEDIAL MENISCECTOMY
Anesthesia: General | Site: Knee | Laterality: Left

## 2014-03-27 MED ORDER — HYDROMORPHONE HCL PF 1 MG/ML IJ SOLN: 0.2500 mg | INTRAMUSCULAR | Status: DC | PRN

## 2014-03-27 MED ORDER — MIDAZOLAM HCL 5 MG/5ML IJ SOLN
INTRAMUSCULAR | Status: DC | PRN
  Administered 2014-03-27: 2 mg via INTRAVENOUS

## 2014-03-27 MED ORDER — PROPOFOL 10 MG/ML IV BOLUS
INTRAVENOUS | Status: AC
  Filled 2014-03-27: qty 20

## 2014-03-27 MED ORDER — BACITRACIN ZINC 500 UNIT/GM EX OINT
TOPICAL_OINTMENT | CUTANEOUS | Status: AC
  Filled 2014-03-27: qty 28.35

## 2014-03-27 MED ORDER — CLINDAMYCIN PHOSPHATE 900 MG/50ML IV SOLN
INTRAVENOUS | Status: AC
  Filled 2014-03-27: qty 50

## 2014-03-27 MED ORDER — FENTANYL CITRATE 0.05 MG/ML IJ SOLN
INTRAMUSCULAR | Status: AC
  Filled 2014-03-27: qty 5

## 2014-03-27 MED ORDER — BUPIVACAINE-EPINEPHRINE PF 0.25-1:200000 % IJ SOLN
INTRAMUSCULAR | Status: AC
Start: 2014-03-27 — End: 2014-03-27
  Filled 2014-03-27: qty 30

## 2014-03-27 MED ORDER — PROMETHAZINE HCL 25 MG/ML IJ SOLN: 6.2500 mg | INTRAMUSCULAR | Status: DC | PRN

## 2014-03-27 MED ORDER — PROPOFOL 10 MG/ML IV BOLUS
INTRAVENOUS | Status: DC | PRN
  Administered 2014-03-27: 200 mg via INTRAVENOUS

## 2014-03-27 MED ORDER — LIDOCAINE HCL (PF) 2 % IJ SOLN
INTRAMUSCULAR | Status: DC | PRN
  Administered 2014-03-27: 75 mg via INTRADERMAL

## 2014-03-27 MED ORDER — SODIUM CHLORIDE 0.9 % IJ SOLN
INTRAMUSCULAR | Status: AC
  Filled 2014-03-27: qty 10

## 2014-03-27 MED ORDER — LIDOCAINE HCL (CARDIAC) 20 MG/ML IV SOLN
INTRAVENOUS | Status: AC
  Filled 2014-03-27: qty 5

## 2014-03-27 MED ORDER — BUPIVACAINE-EPINEPHRINE 0.25% -1:200000 IJ SOLN
INTRAMUSCULAR | Status: DC | PRN
  Administered 2014-03-27: 30 mL

## 2014-03-27 MED ORDER — LACTATED RINGERS IV SOLN
INTRAVENOUS | Status: DC
  Administered 2014-03-27: 12:00:00 via INTRAVENOUS

## 2014-03-27 MED ORDER — ONDANSETRON HCL 4 MG/2ML IJ SOLN
INTRAMUSCULAR | Status: AC
  Filled 2014-03-27: qty 2

## 2014-03-27 MED ORDER — CLINDAMYCIN PHOSPHATE 900 MG/50ML IV SOLN
900.0000 mg | INTRAVENOUS | Status: AC
  Administered 2014-03-27: 900 mg via INTRAVENOUS

## 2014-03-27 MED ORDER — LACTATED RINGERS IV SOLN: INTRAVENOUS | Status: DC

## 2014-03-27 MED ORDER — EPHEDRINE SULFATE 50 MG/ML IJ SOLN
INTRAMUSCULAR | Status: AC
  Filled 2014-03-27: qty 1

## 2014-03-27 MED ORDER — SODIUM CHLORIDE 0.9 % IR SOLN
Status: DC | PRN
  Administered 2014-03-27 (×3): 3000 mL

## 2014-03-27 MED ORDER — CHLORHEXIDINE GLUCONATE 4 % EX LIQD: 60.0000 mL | Freq: Once | CUTANEOUS | Status: DC

## 2014-03-27 MED ORDER — FENTANYL CITRATE 0.05 MG/ML IJ SOLN
INTRAMUSCULAR | Status: DC | PRN
  Administered 2014-03-27 (×5): 50 ug via INTRAVENOUS

## 2014-03-27 MED ORDER — ONDANSETRON HCL 4 MG/2ML IJ SOLN
INTRAMUSCULAR | Status: DC | PRN
  Administered 2014-03-27: 4 mg via INTRAVENOUS

## 2014-03-27 MED ORDER — MIDAZOLAM HCL 2 MG/2ML IJ SOLN
INTRAMUSCULAR | Status: AC
  Filled 2014-03-27: qty 2

## 2014-03-27 MED ORDER — BACITRACIN ZINC 500 UNIT/GM EX OINT
TOPICAL_OINTMENT | CUTANEOUS | Status: DC | PRN
  Administered 2014-03-27: 1 via TOPICAL

## 2014-03-27 SURGICAL SUPPLY — 28 items
BANDAGE ELASTIC 4 VELCRO ST LF (GAUZE/BANDAGES/DRESSINGS) ×3 IMPLANT
BANDAGE ELASTIC 6 VELCRO ST LF (GAUZE/BANDAGES/DRESSINGS) ×2 IMPLANT
BANDAGE GAUZE ELAST BULKY 4 IN (GAUZE/BANDAGES/DRESSINGS) ×2 IMPLANT
BLADE GREAT WHITE 4.2 (BLADE) IMPLANT
BLADE GREAT WHITE 4.2MM (BLADE)
BNDG COHESIVE 6X5 TAN STRL LF (GAUZE/BANDAGES/DRESSINGS) ×3 IMPLANT
DRAPE LG THREE QUARTER DISP (DRAPES) ×3 IMPLANT
DRSG ADAPTIC 3X8 NADH LF (GAUZE/BANDAGES/DRESSINGS) ×3 IMPLANT
DRSG PAD ABDOMINAL 8X10 ST (GAUZE/BANDAGES/DRESSINGS) ×6 IMPLANT
DURAPREP 26ML APPLICATOR (WOUND CARE) ×3 IMPLANT
GLOVE BIOGEL PI IND STRL 8 (GLOVE) ×1 IMPLANT
GLOVE BIOGEL PI INDICATOR 8 (GLOVE) ×2
GLOVE ECLIPSE 8.0 STRL XLNG CF (GLOVE) ×3 IMPLANT
GOWN STRL REUS W/TWL XL LVL3 (GOWN DISPOSABLE) ×3 IMPLANT
MANIFOLD NEPTUNE II (INSTRUMENTS) ×3 IMPLANT
PACK ARTHROSCOPY WL (CUSTOM PROCEDURE TRAY) ×3 IMPLANT
PACK ICE MAXI GEL EZY WRAP (MISCELLANEOUS) ×3 IMPLANT
PAD ABD 8X10 STRL (GAUZE/BANDAGES/DRESSINGS) ×4 IMPLANT
PAD MASON LEG HOLDER (PIN) ×3 IMPLANT
SET ARTHROSCOPY TUBING (MISCELLANEOUS) ×3
SET ARTHROSCOPY TUBING LN (MISCELLANEOUS) ×1 IMPLANT
SPONGE GAUZE 4X4 12PLY (GAUZE/BANDAGES/DRESSINGS) ×2 IMPLANT
SUT ETHILON 3 0 PS 1 (SUTURE) ×3 IMPLANT
TOWEL OR 17X26 10 PK STRL BLUE (TOWEL DISPOSABLE) ×3 IMPLANT
TUBING CONNECTING 10 (TUBING) IMPLANT
TUBING CONNECTING 10' (TUBING)
WAND 90 DEG TURBOVAC W/CORD (SURGICAL WAND) ×3 IMPLANT
WRAP KNEE MAXI GEL POST OP (GAUZE/BANDAGES/DRESSINGS) ×3 IMPLANT

## 2014-03-27 NOTE — Anesthesia Preprocedure Evaluation (Signed)
Anesthesia Evaluation  Patient identified by MRN, date of birth, ID band Patient awake    Reviewed: Allergy & Precautions, H&P , NPO status , Patient's Chart, lab work & pertinent test results  Airway       Dental   Pulmonary former smoker,          Cardiovascular hypertension, Pt. on medications + CAD, + Past MI and + Peripheral Vascular Disease + dysrhythmias Atrial Fibrillation     Neuro/Psych CVA, No Residual Symptoms negative psych ROS   GI/Hepatic Neg liver ROS, GERD-  ,  Endo/Other  negative endocrine ROS  Renal/GU negative Renal ROS  negative genitourinary   Musculoskeletal negative musculoskeletal ROS (+)   Abdominal   Peds  Hematology negative hematology ROS (+)   Anesthesia Other Findings   Reproductive/Obstetrics                           Anesthesia Physical Anesthesia Plan  ASA: III  Anesthesia Plan: General   Post-op Pain Management:    Induction: Intravenous  Airway Management Planned: LMA  Additional Equipment:   Intra-op Plan:   Post-operative Plan: Extubation in OR  Informed Consent: I have reviewed the patients History and Physical, chart, labs and discussed the procedure including the risks, benefits and alternatives for the proposed anesthesia with the patient or authorized representative who has indicated his/her understanding and acceptance.   Dental advisory given  Plan Discussed with: CRNA  Anesthesia Plan Comments:         Anesthesia Quick Evaluation

## 2014-03-27 NOTE — Transfer of Care (Signed)
Immediate Anesthesia Transfer of Care Note  Patient: [name]  Procedure(s) Performed: Procedure(s): left knee arthorscopy with medial chondraplasty of the medial femoral and patella, medial microfracture technique of medial femoral condyl (Left)  Patient Location: PACU  Anesthesia Type:General  Level of Consciousness: awake, sedated, patient cooperative and responds to stimulation  Airway & Oxygen Therapy: Patient Spontanous Breathing and Patient connected to face mask oxygen  Post-op Assessment: Report given to PACU RN and Post -op Vital signs reviewed and stable  Post vital signs: Reviewed and stable  Complications: No apparent anesthesia complications

## 2014-03-27 NOTE — Brief Op Note (Signed)
03/27/2014  1:16 PM  PATIENT:  [name]  63 y.o. female  PRE-OPERATIVE DIAGNOSIS:  Left Knee Medial and Lateral Mensical Tear  POST-OPERATIVE DIAGNOSIS:  Osteoarthritis of the left knee  PROCEDURE:  Procedure(s): left knee arthorscopy with medial chondraplasty of the medial femoral and patella, medial microfracture technique of medial femoral condyl (Left)  SURGEON:  Surgeon(s) and Role:    * Tobi Bastos, MD - Primary  :   ASSISTANTS: OR Tech  ANESTHESIA:   general  EBL:     BLOOD ADMINISTERED:none  DRAINS: none   LOCAL MEDICATIONS USED:  MARCAINE  30cc of 0.25% with Epinephrine   SPECIMEN:  No Specimen  DISPOSITION OF SPECIMEN:  N/A  COUNTS:  YES  TOURNIQUET:  * No tourniquets in log *  DICTATION: .Other Dictation: Dictation Number [id number]  PLAN OF CARE: Discharge to home after PACU  PATIENT DISPOSITION:  PACU - hemodynamically stable.   Delay start of Pharmacological VTE agent (>24hrs) due to surgical blood loss or risk of bleeding: yes

## 2014-03-27 NOTE — Op Note (Signed)
NAMEBERLINE, SEMRAD NO.:  [id number]  MEDICAL RECORD NO.:  [id number]  LOCATION:  [facility]                         FACILITY:  [facility]  PHYSICIAN:  Kipp Brood. Evvie Behrmann, M.D.DATE OF BIRTH:  03-31-1951  DATE OF PROCEDURE:  03/27/2014 DATE OF DISCHARGE:  03/27/2014                              OPERATIVE REPORT   SURGEON:  Jori Moll A. Gladstone Lighter, M.D.  OPERATIVE ASSISTANT:  OR nurse.  PREOPERATIVE DIAGNOSES: 1. Osteoarthritis, left knee. 2. Rule out possibility of a torn medial meniscus. 3. Rule out possibility of a torn lateral meniscus.  POSTOPERATIVE DIAGNOSES:  Osteoarthritis of the left knee.  OPERATION: 1. Diagnostic arthroscopy, left knee. 2. Abrasion chondroplasty of the patella for severe chondromalacia of     the patella, left knee. 3. Abrasion chondroplasty of the medial femoral condyle, left knee. 4. Microfracture technique, medial femoral condyle, left knee.  PROCEDURE IN DETAIL:  Under general anesthesia, routine orthopedic prep and draping left lower extremity was carried out.  She had Cleocin 900 mg IV.  At this time, the appropriate time-out was carried out.  I also marked the appropriate left leg in the holding area with the left leg in the holding area.  After sterile prep and draping completed, I did a small punctate incision suprapatellar pouch.  Inflow cannula was inserted, knee was distended with saline.  Another small punctate incision made in the anterolateral joint.  The arthroscope was entered from lateral approach and a complete diagnostic arthroscopy was carried out.  I went up into the suprapatellar region.  The patella was involved with severe chondromalacia.  I did an abrasion chondroplasty of the patella.  She had a mild synovitis.  I went down into the lateral joint. The lateral joint was clean.  There was some very minimal arthritic changes.  Cruciates were intact over the medial joint.  She had severe chondromalacia of the medial  femoral condyle.  I then utilized the abrasion chondroplasty technique first down the bleeding bone and then I did a microfracture technique of the medial femoral condyle.  Note, I inspected the menisci, they were intact.  No meniscectomy necessary. Thoroughly irrigated out the knee, removed the fluid, closed all 3 punctate incisions with 3-0 nylon suture.  I injected 30 mL of 0.25% Marcaine with epinephrine in knee joint.  Sterile Neosporin dressings were applied.  The patient left the operating room in satisfactory condition.  Postop, she will resume her Eliquis and aspirin tomorrow that she was on preop.          ______________________________ Kipp Brood. Gladstone Lighter, M.D.     RAG/MEDQ  D:  03/27/2014  T:  03/27/2014  Job:  [id number]

## 2014-03-27 NOTE — Interval H&P Note (Signed)
History and Physical Interval Note:  03/27/2014 12:09 PM  [name]  has presented today for surgery, with the diagnosis of Left Knee Medial and Lateral Mensical Tear  The various methods of treatment have been discussed with the patient and family. After consideration of risks, benefits and other options for treatment, the patient has consented to  Procedure(s): LEFT KNEE ARTHROSCOPY WITH MEDIAL AND LATERAL MENISECTOMY (Left) as a surgical intervention .  The patient's history has been reviewed, patient examined, no change in status, stable for surgery.  I have reviewed the patient's chart and labs.  Questions were answered to the patient's satisfaction.     Juda Toepfer A

## 2014-03-27 NOTE — Anesthesia Postprocedure Evaluation (Signed)
Anesthesia Post Note  Patient: [name]  Procedure(s) Performed: Procedure(s) (LRB): left knee arthorscopy with medial chondraplasty of the medial femoral and patella, medial microfracture technique of medial femoral condyl (Left)  Anesthesia type: General  Patient location: PACU  Post pain: Pain level controlled  Post assessment: Post-op Vital signs reviewed  Last Vitals:  Filed Vitals:   03/27/14 1436  BP: 152/72  Pulse: 65  Temp:   Resp: 18    Post vital signs: Reviewed  Level of consciousness: sedated  Complications: No apparent anesthesia complications

## 2014-03-27 NOTE — H&P (Signed)
[name] is an 63 y.o. female.   Chief Complaint: Pain and popping of Left Knee. HPI: Continous pain and swelling of Left Knee.  Past Medical History  Diagnosis Date  . Hypertension   . PVD (peripheral vascular disease) with claudication 07/2006    previous L ext iliac stent and rt SFA stent, known occluded Lt SFA  . Paroxysmal atrial fibrillation     hx of a fib on ASA  AND ELIQUIS   . H/O cardiovascular stress test 12/27/2009    negative for ischemia  . NSTEMI (non-ST elevated myocardial infarction) 04/27/2013  . S/P cardiac cath 04/27/13    NON OBSTRUCTIVE DISEASE, 2+ mr, ef 65-70%  . CAD (coronary artery disease), per cath 04/27/13, non obstructive disease, EF 65-70% 05/11/2013  . Mitral regurgitation,2+ by cath 05/11/2013  . Statin intolerance   . Carotid disease, bilateral 09/2013    Lt carotid 50-69% stentosis, less on rt  . Hyperlipidemia   . Dysrhythmia   . Stroke 2006    SWELLING ALL OVER AND RT SIDE OF FACE DRAWN AND SPEECH SLURRED AND NUMBNESS ON RIGHT SIDE-- ALL RESOLVED  . GERD (gastroesophageal reflux disease)   . Pain     BOTH KNEES - PT HAS TORN MENISCUS LEFT KNEE    Past Surgical History  Procedure Laterality Date  . Ovarian cyst removal    . Iliac artery stent  07/2006    external iliac and Rt SFA stent 07/2006 AND THE LEFT WAS IN 2006  . Cardiac catheterization  04/27/13    non occlusive disease with mild to mod. calcified lesions in ostial LM and proximal LAD and moderate prox. RC AND DISTAL AV GROOVE LCX, ef  . C section 1977    . Orif rt wrist  fracture  1983    Family History  Problem Relation Age of Onset  . CAD Mother   . Lung cancer Mother   . Heart attack Mother   . CAD Father   . Heart disease Father   . Heart attack Father 62    died in hes 63's with heart disease  . Healthy Sister   . Liver cancer Brother   . Healthy Sister   . CAD Brother     has had CABG  . CAD Brother    Social History:  reports that she quit smoking about 16 months  ago. Her smoking use included Cigarettes. She has a 45 pack-year smoking history. She has never used smokeless tobacco. She reports that she does not drink alcohol or use illicit drugs.  Allergies:  Allergies  Allergen Reactions  . Crestor [Rosuvastatin] Other (See Comments)    Itchy rash  . Demerol [Meperidine] Other (See Comments)    Unknown, can't remember   . Effexor [Venlafaxine Hcl]     Doesn't remember   . Inderal [Propranolol]     Doesn't remember   . Lovaza [Omega-3-Acid Ethyl Esters]     Doesn't remember   . Penicillins Other (See Comments)    Questionable high fever when taken and broke out in whelps.   Richardo Hanks [Oxycodone-Acetaminophen]     Doesn't remember   . Warfarin And Related Other (See Comments)    Caused nose bleeds  . Atenolol Rash  . Lipitor [Atorvastatin] Rash  . Nexium [Esomeprazole Magnesium] Rash  . Plavix [Clopidogrel Bisulfate] Rash  . Simvastatin Itching and Rash    Also lipitor intolerant  . Trilipix [Choline Fenofibrate] Other (See Comments)    Doesn't remember  Medications Prior to Admission  Medication Sig Dispense Refill  . acetaminophen (TYLENOL) 500 MG tablet Take 1,000 mg by mouth every 6 (six) hours as needed for pain.      Marland Kitchen diltiazem (CARDIZEM CD) 180 MG 24 hr capsule Take 180 mg by mouth every morning.      . docusate sodium (COLACE) 100 MG capsule Take 200 mg by mouth 2 (two) times daily.      Marland Kitchen ezetimibe (ZETIA) 10 MG tablet Take 10 mg by mouth at bedtime.      . fenofibrate (TRICOR) 145 MG tablet Take 145 mg by mouth at bedtime.      . flecainide (TAMBOCOR) 50 MG tablet Take 50 mg by mouth 2 (two) times daily.      . hydrocortisone (ANUSOL-HC) 2.5 % rectal cream Place 1 application rectally 2 (two) times daily as needed for hemorrhoids or itching.      Marland Kitchen omeprazole (PRILOSEC) 20 MG capsule Take 20 mg by mouth 2 (two) times daily.      . pravastatin (PRAVACHOL) 40 MG tablet Take 1 tablet (40 mg total) by mouth at bedtime.  30  tablet  11  . traMADol (ULTRAM) 50 MG tablet Take 50 mg by mouth every 6 (six) hours as needed for pain.      Marland Kitchen apixaban (ELIQUIS) 5 MG TABS tablet Take 1 tablet (5 mg total) by mouth 2 (two) times daily.  60 tablet  5  . aspirin EC 81 MG tablet Take 81 mg by mouth daily.      . polyethylene glycol (MIRALAX / GLYCOLAX) packet Take 17 g by mouth daily as needed (constipation).      . simethicone (MYLICON) 355 MG chewable tablet Chew 125 mg by mouth every 6 (six) hours as needed for flatulence.        No results found for this or any previous visit (from the past 48 hour(s)). No results found.  Review of Systems  Constitutional: Negative.   HENT: Negative.   Eyes: Negative.   Respiratory: Negative.   Cardiovascular: Negative.   Gastrointestinal: Negative.   Genitourinary: Negative.   Musculoskeletal: Positive for joint pain.  Neurological: Negative.   Endo/Heme/Allergies: Negative.   Psychiatric/Behavioral: Negative.     Blood pressure 181/87, pulse 83, temperature 97.9 F (36.6 C), temperature source Oral, resp. rate 20, SpO2 97.00%. Physical Exam  Constitutional: She appears well-developed.  HENT:  Head: Normocephalic.  Eyes: Pupils are equal, round, and reactive to light.  Neck: Normal range of motion.  Cardiovascular: Normal rate.   Respiratory: Effort normal.  GI: Soft.  Musculoskeletal:  Painful,swollen left Knee.  Neurological: She is alert.  Skin: Skin is warm.     Assessment/Plan Arthroscopic Medial menisectomy of Left Knee.  [name] A 03/27/2014, 12:03 PM

## 2014-03-28 ENCOUNTER — Encounter (HOSPITAL_COMMUNITY): Payer: Self-pay | Admitting: Orthopedic Surgery

## 2014-04-18 ENCOUNTER — Other Ambulatory Visit: Payer: Self-pay | Admitting: Cardiovascular Disease

## 2014-05-09 ENCOUNTER — Other Ambulatory Visit (HOSPITAL_COMMUNITY): Payer: Self-pay | Admitting: Cardiovascular Disease

## 2014-05-09 DIAGNOSIS — I739 Peripheral vascular disease, unspecified: Secondary | ICD-10-CM

## 2014-05-10 ENCOUNTER — Ambulatory Visit (HOSPITAL_COMMUNITY)
Admission: RE | Admit: 2014-05-10 | Discharge: 2014-05-10 | Disposition: A | Discharge status: Home / Self Care | Payer: PRIVATE HEALTH INSURANCE | Source: Ambulatory Visit | Attending: Cardiology | Admitting: Cardiology

## 2014-05-10 DIAGNOSIS — I70219 Atherosclerosis of native arteries of extremities with intermittent claudication, unspecified extremity: Secondary | ICD-10-CM | POA: Diagnosis present

## 2014-05-10 DIAGNOSIS — I739 Peripheral vascular disease, unspecified: Secondary | ICD-10-CM

## 2014-05-10 NOTE — Progress Notes (Signed)
Lower Extremity Arterial Duplex Completed. °Brianna L Mazza,RVT °

## 2014-05-16 ENCOUNTER — Other Ambulatory Visit (HOSPITAL_COMMUNITY): Payer: Self-pay | Admitting: Cardiovascular Disease

## 2014-05-16 NOTE — Telephone Encounter (Signed)
Rx was sent to pharmacy electronically. 

## 2014-05-23 ENCOUNTER — Telehealth: Payer: Self-pay | Admitting: *Deleted

## 2014-05-23 DIAGNOSIS — I739 Peripheral vascular disease, unspecified: Secondary | ICD-10-CM

## 2014-05-23 NOTE — Telephone Encounter (Signed)
Patient notified of lower extremity arterial doppler results. Informed she will need repeat doppler in 1 year. Voiced understanding. Repeat doppler ordered.

## 2014-05-23 NOTE — Telephone Encounter (Signed)
Message copied by Fidel Levy on Wed May 23, 2014  9:39 AM ------      Message from: Lorretta Harp      Created: Sun May 20, 2014  4:30 PM       No change from prior study. Repeat in 12 months. ------

## 2014-07-17 ENCOUNTER — Ambulatory Visit: Payer: PRIVATE HEALTH INSURANCE | Admitting: Cardiovascular Disease

## 2014-08-24 ENCOUNTER — Ambulatory Visit (INDEPENDENT_AMBULATORY_CARE_PROVIDER_SITE_OTHER): Payer: PRIVATE HEALTH INSURANCE | Admitting: Cardiovascular Disease

## 2014-08-24 ENCOUNTER — Encounter: Payer: Self-pay | Admitting: Cardiovascular Disease

## 2014-08-24 VITALS — BP 148/84 | HR 63 | Ht 65.5 in | Wt 159.0 lb

## 2014-08-24 DIAGNOSIS — I2510 Atherosclerotic heart disease of native coronary artery without angina pectoris: Secondary | ICD-10-CM

## 2014-08-24 DIAGNOSIS — I480 Paroxysmal atrial fibrillation: Secondary | ICD-10-CM

## 2014-08-24 DIAGNOSIS — I4891 Unspecified atrial fibrillation: Secondary | ICD-10-CM

## 2014-08-24 DIAGNOSIS — I739 Peripheral vascular disease, unspecified: Secondary | ICD-10-CM

## 2014-08-24 DIAGNOSIS — I779 Disorder of arteries and arterioles, unspecified: Secondary | ICD-10-CM

## 2014-08-24 DIAGNOSIS — E785 Hyperlipidemia, unspecified: Secondary | ICD-10-CM

## 2014-08-24 NOTE — Assessment & Plan Note (Signed)
>>  ASSESSMENT AND PLAN FOR MIXED HYPERLIPIDEMIA WRITTEN ON 08/24/2014  9:05 AM BY BERRY, JONATHAN J, MD  On statin therapy followed by her PCP

## 2014-08-24 NOTE — Assessment & Plan Note (Signed)
On statin therapy followed by her PCP 

## 2014-08-24 NOTE — Progress Notes (Signed)
08/24/2014 [name]   [birth year]  [id number]  Primary Physician Octavio Graves, DO Primary Cardiologist: Lorretta Harp MD [name]   HPI:  The patient is a 63 year old, thin-appearing, widowed Caucasian female, mother of 2 children, grandmother to 5 grandchildren and 3 step-grandchildren who she has raised. I saw her 15 months ago. She has a history of PVOD status post left external iliac artery PTA and stenting as well as right SFA stenting by myself August 2007 with a known occluded left SFA. Her other problems include recently discontinued tobacco abuse November 11, 2012, hypertension and hyperlipidemia. She is statin intolerant. She has had PAF in the past and has been on Coumadin anticoagulation which was stopped because of nosebleeds and changed to Eliquis . She really denies chest pain, shortness of breath, or claudication. Her last Myoview performed December 27, 2009, was nonischemic. Dopplers performed a year ago showed patent stents with right ABI of 0.87 and a left of 0.65 .She had cardiac catheterization performed by Dr. Ellyn Hack revealed noncritical CAD.   Current Outpatient Prescriptions  Medication Sig Dispense Refill  . acetaminophen (TYLENOL) 500 MG tablet Take 500 mg by mouth every 6 (six) hours as needed.      . diltiazem (CARDIZEM CD) 180 MG 24 hr capsule Take 180 mg by mouth every morning.      . docusate sodium (COLACE) 100 MG capsule Take 200 mg by mouth 2 (two) times daily.      Marland Kitchen ELIQUIS 5 MG TABS tablet TAKE 1 TABLET (5 MG TOTAL) BY MOUTH 2 (TWO) TIMES DAILY.  60 tablet  4  . fenofibrate (TRICOR) 145 MG tablet TAKE ONE TABLET BY MOUTH AT BEDTIME  30 tablet  6  . flecainide (TAMBOCOR) 50 MG tablet TAKE ONE TABLET BY MOUTH TWICE DAILY  60 tablet  6  . hydrochlorothiazide (HYDRODIURIL) 25 MG tablet Take 25 mg by mouth daily.       Marland Kitchen ibuprofen (ADVIL,MOTRIN) 200 MG tablet Take 200 mg by mouth every 6 (six) hours as needed.      Marland Kitchen omeprazole  (PRILOSEC) 20 MG capsule Take 20 mg by mouth 2 (two) times daily.      . polyethylene glycol (MIRALAX / GLYCOLAX) packet Take 17 g by mouth daily as needed (constipation).      . Probiotic Product (PROBIOTIC DAILY PO) Take 1 tablet by mouth daily.      . traMADol (ULTRAM) 50 MG tablet Take 50 mg by mouth 3 (three) times daily as needed.       Marland Kitchen ZETIA 10 MG tablet TAKE ONE TABLET BY MOUTH AT BEDTIME  30 tablet  6   No current facility-administered medications for this visit.    Allergies  Allergen Reactions  . Crestor [Rosuvastatin] Other (See Comments)    Itchy rash  . Demerol [Meperidine] Other (See Comments)    Unknown, can't remember   . Effexor [Venlafaxine Hcl]     Doesn't remember   . Inderal [Propranolol]     Doesn't remember   . Lovaza [Omega-3-Acid Ethyl Esters]     Doesn't remember   . Penicillins Other (See Comments)    Questionable high fever when taken and broke out in whelps.   Richardo Hanks [Oxycodone-Acetaminophen]     Doesn't remember   . Pravastatin Itching  . Warfarin And Related Other (See Comments)    Caused nose bleeds  . Atenolol Rash  . Lipitor [Atorvastatin] Rash  . Nexium [Esomeprazole Magnesium] Rash  .  Plavix [Clopidogrel Bisulfate] Rash  . Simvastatin Itching and Rash    Also lipitor intolerant  . Trilipix [Choline Fenofibrate] Other (See Comments)    Doesn't remember     History   Social History  . Marital Status: Widowed    Spouse Name: N/A    Number of Children: 2  . Years of Education: 12   Occupational History  . CNA    Social History Main Topics  . Smoking status: Former Smoker -- 1.00 packs/day for 45 years    Types: Cigarettes    Quit date: 11/11/2012  . Smokeless tobacco: Never Used  . Alcohol Use: No  . Drug Use: No  . Sexual Activity: Not on file   Other Topics Concern  . Not on file   Social History Narrative  . No narrative on file     Review of Systems: General: negative for chills, fever, night sweats or weight  changes.  Cardiovascular: negative for chest pain, dyspnea on exertion, edema, orthopnea, palpitations, paroxysmal nocturnal dyspnea or shortness of breath Dermatological: negative for rash Respiratory: negative for cough or wheezing Urologic: negative for hematuria Abdominal: negative for nausea, vomiting, diarrhea, bright red blood per rectum, melena, or hematemesis Neurologic: negative for visual changes, syncope, or dizziness All other systems reviewed and are otherwise negative except as noted above.    Blood pressure 148/84, pulse 63, height 5' 5.5" (1.664 m), weight 159 lb (72.122 kg).  General appearance: alert and no distress Neck: no adenopathy, no JVD, supple, symmetrical, trachea midline, thyroid not enlarged, symmetric, no tenderness/mass/nodules and a soft left carotid bruit Lungs: clear to auscultation bilaterally Heart: regular rate and rhythm, S1, S2 normal, no murmur, click, rub or gallop Extremities: extremities normal, atraumatic, no cyanosis or edema  EKG sinus rhythm at 63 without ST or T wave changes  ASSESSMENT AND PLAN:   PAD (peripheral artery disease), Lt carotid 50-70%: , Hx stent Lt exteral iliac & RSFA stent, known occl. LSFA  History of peripheral arterial disease status post right SFA stenting and left iliac stenting. Her left peripheral edge of them performed 03/11/07 was remarkable for 90% in-stent restenosis within the proximal right SFA stent which was redilated. She has a known occlusion of her left SFA. She does get mild right calf claudication. Recent lower extremity arterial Doppler studies performed by/21/15 revealed patent stents with a right ABI of 0.81 and a left of 0.56.  PAF (paroxysmal atrial fibrillation), maintaing SR Maintaining sinus rhythm. On Eliquis anti-coagulation  CAD (coronary artery disease), per cath 04/27/13, non obstructive disease 20-30% LM; LAD 30%; 50-60% OM2; RCA 40%; EF 65-70% Nonobstructive CAD by cardiac catheterization  performed by Dr. Glenetta Hew.  Hyperlipidemia On statin therapy followed by her PCP  Carotid artery disease Patient has mild right and moderate left ICA stenosis by duplex ultrasound. She is neurologically asymptomatic on oral anticoagulation. We have been following this by duplex ultrasound.      Lorretta Harp MD FACP,FACC,FAHA, [facility] 08/24/2014 9:06 AM

## 2014-08-24 NOTE — Patient Instructions (Signed)
Your physician wants you to follow-up in: 1 year with Dr Berry. You will receive a reminder letter in the mail two months in advance. If you don't receive a letter, please call our office to schedule the follow-up appointment.  

## 2014-08-24 NOTE — Assessment & Plan Note (Signed)
Maintaining sinus rhythm. On Eliquis anti-coagulation

## 2014-08-24 NOTE — Assessment & Plan Note (Signed)
Nonobstructive CAD by cardiac catheterization performed by Dr. Glenetta Hew.

## 2014-08-24 NOTE — Assessment & Plan Note (Signed)
Patient has mild right and moderate left ICA stenosis by duplex ultrasound. She is neurologically asymptomatic on oral anticoagulation. We have been following this by duplex ultrasound.

## 2014-08-24 NOTE — Assessment & Plan Note (Signed)
History of peripheral arterial disease status post right SFA stenting and left iliac stenting. Her left peripheral edge of them performed 03/11/07 was remarkable for 90% in-stent restenosis within the proximal right SFA stent which was redilated. She has a known occlusion of her left SFA. She does get mild right calf claudication. Recent lower extremity arterial Doppler studies performed by/21/15 revealed patent stents with a right ABI of 0.81 and a left of 0.56.

## 2014-09-17 ENCOUNTER — Other Ambulatory Visit: Payer: Self-pay | Admitting: Cardiovascular Disease

## 2014-10-05 ENCOUNTER — Other Ambulatory Visit: Payer: Self-pay

## 2014-11-29 ENCOUNTER — Encounter (HOSPITAL_COMMUNITY): Payer: Self-pay | Admitting: Cardiology

## 2014-12-16 ENCOUNTER — Other Ambulatory Visit: Payer: Self-pay | Admitting: Cardiovascular Disease

## 2014-12-17 NOTE — Telephone Encounter (Signed)
Rx refill sent to patient pharmacy   

## 2014-12-28 ENCOUNTER — Other Ambulatory Visit: Payer: Self-pay

## 2014-12-28 MED ORDER — PRAVASTATIN SODIUM 40 MG PO TABS: 40.0000 mg | ORAL_TABLET | Freq: Every evening | ORAL | Status: DC

## 2014-12-28 NOTE — Telephone Encounter (Signed)
Rx(s) sent to pharmacy electronically.  

## 2015-02-06 ENCOUNTER — Ambulatory Visit (HOSPITAL_COMMUNITY)
Admission: RE | Admit: 2015-02-06 | Discharge: 2015-02-06 | Disposition: A | Discharge status: Home / Self Care | Payer: Medicare Other | Source: Ambulatory Visit | Attending: Cardiovascular Disease | Admitting: Cardiovascular Disease

## 2015-02-06 DIAGNOSIS — I6522 Occlusion and stenosis of left carotid artery: Secondary | ICD-10-CM | POA: Insufficient documentation

## 2015-02-06 DIAGNOSIS — I779 Disorder of arteries and arterioles, unspecified: Secondary | ICD-10-CM

## 2015-02-06 DIAGNOSIS — I6529 Occlusion and stenosis of unspecified carotid artery: Secondary | ICD-10-CM

## 2015-02-06 NOTE — Progress Notes (Signed)
Carotid Duplex Completed. Stable LICA stenosis of 96-75%. Oda Cogan, BS, RDMS, RVT

## 2015-02-26 ENCOUNTER — Telehealth: Payer: Self-pay | Admitting: *Deleted

## 2015-02-26 DIAGNOSIS — I6523 Occlusion and stenosis of bilateral carotid arteries: Secondary | ICD-10-CM

## 2015-02-26 NOTE — Telephone Encounter (Signed)
Carotid doppler results released to MyChart.  Order placed for recheck in 6 months. 

## 2015-02-26 NOTE — Telephone Encounter (Signed)
-----   Message from Lorretta Harp, MD sent at 02/19/2015  7:33 AM EST ----- No change from prior study. Repeat in 6 months

## 2015-03-14 NOTE — Patient Instructions (Addendum)
Your procedure is scheduled on: 03/21/2015  Report to [facility] at  28  AM.  Call this number if you have problems the morning of surgery: [phone]   Do not eat food or drink liquids :After Midnight.      Take these medicines the morning of surgery with A SIP OF WATER: cadiazem, tambocor, hydrodiuril, prilosec, ultram   Do not wear jewelry, make-up or nail polish.  Do not wear lotions, powders, or perfumes.   Do not shave 48 hours prior to surgery.  Do not bring valuables to the hospital.  Contacts, dentures or bridgework may not be worn into surgery.  Leave suitcase in the car. After surgery it may be brought to your room.  For patients admitted to the hospital, checkout time is 11:00 AM the day of discharge.   Patients discharged the day of surgery will not be allowed to drive home.  :     Please read over the following fact sheets that you were given: Coughing and Deep Breathing, Surgical Site Infection Prevention, Anesthesia Post-op Instructions and Care and Recovery After Surgery    Cataract A cataract is a clouding of the lens of the eye. When a lens becomes cloudy, vision is reduced based on the degree and nature of the clouding. Many cataracts reduce vision to some degree. Some cataracts make people more near-sighted as they develop. Other cataracts increase glare. Cataracts that are ignored and become worse can sometimes look white. The white color can be seen through the pupil. CAUSES   Aging. However, cataracts may occur at any age, even in newborns.   Certain drugs.   Trauma to the eye.   Certain diseases such as diabetes.   Specific eye diseases such as chronic inflammation inside the eye or a sudden attack of a rare form of glaucoma.   Inherited or acquired medical problems.  SYMPTOMS   Gradual, progressive drop in vision in the affected eye.   Severe, rapid visual loss. This most often happens when trauma is the cause.  DIAGNOSIS  To detect a cataract, an  eye doctor examines the lens. Cataracts are best diagnosed with an exam of the eyes with the pupils enlarged (dilated) by drops.  TREATMENT  For an early cataract, vision may improve by using different eyeglasses or stronger lighting. If that does not help your vision, surgery is the only effective treatment. A cataract needs to be surgically removed when vision loss interferes with your everyday activities, such as driving, reading, or watching TV. A cataract may also have to be removed if it prevents examination or treatment of another eye problem. Surgery removes the cloudy lens and usually replaces it with a substitute lens (intraocular lens, IOL).  At a time when both you and your doctor agree, the cataract will be surgically removed. If you have cataracts in both eyes, only one is usually removed at a time. This allows the operated eye to heal and be out of danger from any possible problems after surgery (such as infection or poor wound healing). In rare cases, a cataract may be doing damage to your eye. In these cases, your caregiver may advise surgical removal right away. The vast majority of people who have cataract surgery have better vision afterward. HOME CARE INSTRUCTIONS  If you are not planning surgery, you may be asked to do the following:  Use different eyeglasses.   Use stronger or brighter lighting.   Ask your eye doctor about reducing your medicine dose  or changing medicines if it is thought that a medicine caused your cataract. Changing medicines does not make the cataract go away on its own.   Become familiar with your surroundings. Poor vision can lead to injury. Avoid bumping into things on the affected side. You are at a higher risk for tripping or falling.   Exercise extreme care when driving or operating machinery.   Wear sunglasses if you are sensitive to bright light or experiencing problems with glare.  SEEK IMMEDIATE MEDICAL CARE IF:   You have a worsening or sudden  vision loss.   You notice redness, swelling, or increasing pain in the eye.   You have a fever.  Document Released: 12/07/2005 Document Revised: 11/26/2011 Document Reviewed: 07/31/2011 [facility] Patient Information 2012 [place].PATIENT INSTRUCTIONS POST-ANESTHESIA  IMMEDIATELY FOLLOWING SURGERY:  Do not drive or operate machinery for the first twenty four hours after surgery.  Do not make any important decisions for twenty four hours after surgery or while taking narcotic pain medications or sedatives.  If you develop intractable nausea and vomiting or a severe headache please notify your doctor immediately.  FOLLOW-UP:  Please make an appointment with your surgeon as instructed. You do not need to follow up with anesthesia unless specifically instructed to do so.  WOUND CARE INSTRUCTIONS (if applicable):  Keep a dry clean dressing on the anesthesia/puncture wound site if there is drainage.  Once the wound has quit draining you may leave it open to air.  Generally you should leave the bandage intact for twenty four hours unless there is drainage.  If the epidural site drains for more than 36-48 hours please call the anesthesia department.  QUESTIONS?:  Please feel free to call your physician or the hospital operator if you have any questions, and they will be happy to assist you.

## 2015-03-16 ENCOUNTER — Other Ambulatory Visit: Payer: Self-pay | Admitting: Cardiovascular Disease

## 2015-03-18 NOTE — Telephone Encounter (Signed)
Rx(s) sent to pharmacy electronically.  

## 2015-03-19 ENCOUNTER — Other Ambulatory Visit: Payer: Self-pay

## 2015-03-19 ENCOUNTER — Encounter (HOSPITAL_COMMUNITY)
Admission: RE | Admit: 2015-03-19 | Discharge: 2015-03-19 | Disposition: A | Discharge status: Home / Self Care | Payer: Medicare Other | Source: Ambulatory Visit | Attending: Ophthalmology | Admitting: Ophthalmology

## 2015-03-19 ENCOUNTER — Encounter (HOSPITAL_COMMUNITY): Payer: Self-pay

## 2015-03-19 DIAGNOSIS — H40031 Anatomical narrow angle, right eye: Secondary | ICD-10-CM | POA: Diagnosis not present

## 2015-03-19 DIAGNOSIS — H269 Unspecified cataract: Secondary | ICD-10-CM | POA: Diagnosis not present

## 2015-03-19 HISTORY — DX: Unspecified osteoarthritis, unspecified site: M19.90

## 2015-03-19 HISTORY — DX: Unspecified glaucoma: H40.9

## 2015-03-19 LAB — BASIC METABOLIC PANEL
ANION GAP: 11 (ref 5–15)
BUN: 22 mg/dL (ref 6–23)
CO2: 25 mmol/L (ref 19–32)
CREATININE: 0.98 mg/dL (ref 0.50–1.10)
Calcium: 9.1 mg/dL (ref 8.4–10.5)
Chloride: 103 mmol/L (ref 96–112)
GFR, EST AFRICAN AMERICAN: 70 mL/min — AB (ref >90)
GFR, EST NON AFRICAN AMERICAN: 60 mL/min — AB (ref >90)
GLUCOSE: 141 mg/dL — AB (ref 70–99)
POTASSIUM: 4.4 mmol/L (ref 3.5–5.1)
Sodium: 139 mmol/L (ref 135–145)

## 2015-03-19 LAB — CBC
HCT: 48.7 % — ABNORMAL HIGH (ref 36.0–46.0)
Hemoglobin: 15.7 g/dL — ABNORMAL HIGH (ref 12.0–15.0)
MCH: 27.3 pg (ref 26.0–34.0)
MCHC: 32.2 g/dL (ref 30.0–36.0)
MCV: 84.7 fL (ref 78.0–100.0)
Platelets: 284 10*3/uL (ref 150–400)
RBC: 5.75 MIL/uL — ABNORMAL HIGH (ref 3.87–5.11)
RDW: 17.8 % — ABNORMAL HIGH (ref 11.5–15.5)
WBC: 14.5 10*3/uL — AB (ref 4.0–10.5)

## 2015-03-20 MED ORDER — CYCLOPENTOLATE-PHENYLEPHRINE OP SOLN OPTIME - NO CHARGE
OPHTHALMIC | Status: AC
Start: 2015-03-20 — End: 2015-03-20
  Filled 2015-03-20: qty 2

## 2015-03-20 MED ORDER — NEOMYCIN-POLYMYXIN-DEXAMETH 3.5-10000-0.1 OP SUSP
OPHTHALMIC | Status: AC
  Filled 2015-03-20: qty 5

## 2015-03-20 MED ORDER — LIDOCAINE HCL 3.5 % OP GEL
OPHTHALMIC | Status: AC
  Filled 2015-03-20: qty 1

## 2015-03-20 MED ORDER — PHENYLEPHRINE HCL 2.5 % OP SOLN
OPHTHALMIC | Status: AC
  Filled 2015-03-20: qty 15

## 2015-03-20 MED ORDER — TETRACAINE HCL 0.5 % OP SOLN
OPHTHALMIC | Status: AC
  Filled 2015-03-20: qty 2

## 2015-03-20 MED ORDER — LIDOCAINE HCL (PF) 1 % IJ SOLN
INTRAMUSCULAR | Status: AC
  Filled 2015-03-20: qty 2

## 2015-03-21 ENCOUNTER — Encounter (HOSPITAL_COMMUNITY): Payer: Self-pay | Admitting: *Deleted

## 2015-03-21 ENCOUNTER — Encounter (HOSPITAL_COMMUNITY)
Admission: RE | Disposition: A | Discharge status: Home / Self Care | Payer: Self-pay | Source: Ambulatory Visit | Attending: Ophthalmology

## 2015-03-21 ENCOUNTER — Ambulatory Visit (HOSPITAL_COMMUNITY): Payer: Medicare Other | Admitting: Anesthesiology

## 2015-03-21 ENCOUNTER — Ambulatory Visit (HOSPITAL_COMMUNITY)
Admission: RE | Admit: 2015-03-21 | Discharge: 2015-03-21 | Disposition: A | Discharge status: Home / Self Care | Payer: Medicare Other | Source: Ambulatory Visit | Attending: Ophthalmology | Admitting: Ophthalmology

## 2015-03-21 DIAGNOSIS — H269 Unspecified cataract: Secondary | ICD-10-CM | POA: Insufficient documentation

## 2015-03-21 DIAGNOSIS — H40031 Anatomical narrow angle, right eye: Secondary | ICD-10-CM | POA: Diagnosis not present

## 2015-03-21 HISTORY — PX: CATARACT EXTRACTION W/PHACO: SHX586

## 2015-03-21 SURGERY — PHACOEMULSIFICATION, CATARACT, WITH IOL INSERTION
Anesthesia: Monitor Anesthesia Care | Site: Eye | Laterality: Right

## 2015-03-21 MED ORDER — EPINEPHRINE HCL 1 MG/ML IJ SOLN
INTRAOCULAR | Status: DC | PRN
  Administered 2015-03-21: 500 mL

## 2015-03-21 MED ORDER — LIDOCAINE HCL 1 % IJ SOLN
INTRAMUSCULAR | Status: DC | PRN
Start: 2015-03-21 — End: 2015-03-21
  Administered 2015-03-21: .7 mL

## 2015-03-21 MED ORDER — SODIUM HYALURONATE 23 MG/ML IO SOLN
INTRAOCULAR | Status: DC | PRN
  Administered 2015-03-21: 0.6 mL via INTRAOCULAR

## 2015-03-21 MED ORDER — MIDAZOLAM HCL 2 MG/2ML IJ SOLN
1.0000 mg | INTRAMUSCULAR | Status: DC | PRN
  Administered 2015-03-21: 2 mg via INTRAVENOUS

## 2015-03-21 MED ORDER — POVIDONE-IODINE 5 % OP SOLN
OPHTHALMIC | Status: DC | PRN
  Administered 2015-03-21: 1 via OPHTHALMIC

## 2015-03-21 MED ORDER — BSS IO SOLN
INTRAOCULAR | Status: DC | PRN
  Administered 2015-03-21: 15 mL via INTRAOCULAR

## 2015-03-21 MED ORDER — LIDOCAINE HCL 3.5 % OP GEL
1.0000 "application " | Freq: Once | OPHTHALMIC | Status: AC
  Administered 2015-03-21: 1 via OPHTHALMIC

## 2015-03-21 MED ORDER — CYCLOPENTOLATE-PHENYLEPHRINE 0.2-1 % OP SOLN
1.0000 [drp] | OPHTHALMIC | Status: AC
  Administered 2015-03-21 (×3): 1 [drp] via OPHTHALMIC

## 2015-03-21 MED ORDER — EPINEPHRINE HCL 1 MG/ML IJ SOLN
INTRAMUSCULAR | Status: AC
  Filled 2015-03-21: qty 1

## 2015-03-21 MED ORDER — PHENYLEPHRINE HCL 2.5 % OP SOLN
1.0000 [drp] | OPHTHALMIC | Status: AC
  Administered 2015-03-21 (×3): 1 [drp] via OPHTHALMIC

## 2015-03-21 MED ORDER — TETRACAINE HCL 0.5 % OP SOLN
1.0000 [drp] | OPHTHALMIC | Status: AC
  Administered 2015-03-21 (×3): 1 [drp] via OPHTHALMIC

## 2015-03-21 MED ORDER — NEOMYCIN-POLYMYXIN-DEXAMETH 0.1 % OP SUSP
OPHTHALMIC | Status: DC | PRN
  Administered 2015-03-21: 1 [drp] via OPHTHALMIC

## 2015-03-21 MED ORDER — LIDOCAINE HCL 3.5 % OP GEL
OPHTHALMIC | Status: DC | PRN
  Administered 2015-03-21: 1 via OPHTHALMIC

## 2015-03-21 MED ORDER — MIDAZOLAM HCL 5 MG/5ML IJ SOLN
INTRAMUSCULAR | Status: AC
  Filled 2015-03-21: qty 5

## 2015-03-21 MED ORDER — PROVISC 10 MG/ML IO SOLN
INTRAOCULAR | Status: DC | PRN
  Administered 2015-03-21: .55 mL via INTRAOCULAR

## 2015-03-21 MED ORDER — FENTANYL CITRATE 0.05 MG/ML IJ SOLN
INTRAMUSCULAR | Status: AC
  Filled 2015-03-21: qty 2

## 2015-03-21 MED ORDER — LACTATED RINGERS IV SOLN
INTRAVENOUS | Status: DC
  Administered 2015-03-21: 13:00:00 via INTRAVENOUS

## 2015-03-21 MED ORDER — FENTANYL CITRATE 0.05 MG/ML IJ SOLN
25.0000 ug | INTRAMUSCULAR | Status: AC
  Administered 2015-03-21 (×2): 25 ug via INTRAVENOUS

## 2015-03-21 SURGICAL SUPPLY — 13 items
CLOTH BEACON ORANGE TIMEOUT ST (SAFETY) ×1 IMPLANT
EYE SHIELD UNIVERSAL CLEAR (GAUZE/BANDAGES/DRESSINGS) ×1 IMPLANT
GLOVE BIOGEL PI IND STRL 6.5 (GLOVE) IMPLANT
GLOVE BIOGEL PI IND STRL 7.0 (GLOVE) IMPLANT
GLOVE BIOGEL PI IND STRL 7.5 (GLOVE) IMPLANT
GLOVE BIOGEL PI INDICATOR 6.5 (GLOVE) ×1
GLOVE BIOGEL PI INDICATOR 7.0 (GLOVE) ×1
GLOVE BIOGEL PI INDICATOR 7.5 (GLOVE) ×1
PAD ARMBOARD 7.5X6 YLW CONV (MISCELLANEOUS) ×1 IMPLANT
SIGHTPATH CAT PROC W REG LENS (Ophthalmic Related) ×1 IMPLANT
TAPE SURG TRANSPORE 1 IN (GAUZE/BANDAGES/DRESSINGS) IMPLANT
TAPE SURGICAL TRANSPORE 1 IN (GAUZE/BANDAGES/DRESSINGS) ×1
WATER STERILE IRR 250ML POUR (IV SOLUTION) ×1 IMPLANT

## 2015-03-21 NOTE — Op Note (Signed)
Date of procedure: March 21, 2015  Pre-operative diagnosis: Visually significant cataract, Right Eye; Anatomic narrow angle, Right Eye  Post-operative diagnosis: Visually significant cataract, Right Eye; Anatomic narrow angle, Right Eye  Procedure: Removal of cataract via phacoemulsification and insertion of intra-ocular lens AMO PCBOO 32.0D into the capsular bag of the Right Eye  Attending surgeon: Gerda Diss. Eulon Allnutt, MD, MA  Anesthesia: MAC, Topical 0.75% Bupivicaine  Complications: None  Estimated Blood Loss: <85mL (minimal)  Specimens: None  Implants: As above  Indications:  Visually significant cataract, Right Eye; Anatomically narrow angle, right eye  Procedure:  The patient was seen and identified in the pre-operative area. The operative eye was identified and dilated.  The operative eye was marked.  Topical 0.75% Bupivicaine was administered to the operative eye.     The patient was then to the operative suite and placed in the supine position.  A timeout was performed confirming the patient, procedure to be performed, and all other relevant information.   The patient's face was prepped and draped in the usual fashion for intra-ocular surgery.  A lid speculum was placed into the operative eye and the surgical microscope moved into place and focused.  A superotemporal paracentesis was created using a 20-gauge MVR blade.  Shugarcaine was injected into the anterior chamber.  Viscoelastic was injected into the anterior chamber.  A temporal clear-corneal main wound incision was created using a 2.59mm microkeratome.  A continuous curvilinear capsulorrhexis was initiated using an irrigating cystitome and completed using capsulorrhexis forceps.  Hydrodissection and hydrodeliniation were performed.  Viscoelastic was injected into the anterior chamber.  A phacoemulsification handpiece and a chopper as a second instrument were used to remove the nucleus and epinucleus. The irrigation/aspiration  handpiece was used to remove any remaining cortical material.   The capsular bag was reinflated with viscoelastic, checked, and found to be intact. An [place]  intraocular lens with a 32.0D power was inserted into the capsular bag and dialed into place using a [place] hook.  The irrigation/aspiration handpiece was used to remove any remaining viscoelastic. Miochol was injected into the eye.  The clear corneal wound and paracentesis wounds were then hydrated and checked with Weck-Cels to be watertight.  The lid-speculum and drape was removed, and the patient's face was cleaned with a wet and dry 4x4.  Vigamox was instilled in the eye before a clear shield was taped over the eye. The patient was taken to the post-operative care unit in good condition, having tolerated the procedure well.  Post-Op Instructions: The patient will follow up at [facility] for a same day post-operative evaluation and will receive all other orders and instructions.

## 2015-03-21 NOTE — Anesthesia Preprocedure Evaluation (Signed)
Anesthesia Evaluation  Patient identified by MRN, date of birth, ID band Patient awake    Reviewed: Allergy & Precautions, H&P , NPO status , Patient's Chart, lab work & pertinent test results  Airway Mallampati: II  TM Distance: >3 FB     Dental  (+) Edentulous Upper, Edentulous Lower   Pulmonary former smoker,  breath sounds clear to auscultation        Cardiovascular hypertension, Pt. on medications + CAD, + Past MI and + Peripheral Vascular Disease + dysrhythmias Atrial Fibrillation Rhythm:Regular Rate:Normal     Neuro/Psych CVA, No Residual Symptoms negative psych ROS   GI/Hepatic Neg liver ROS, GERD-  Medicated and Controlled,  Endo/Other  negative endocrine ROS  Renal/GU negative Renal ROS  negative genitourinary   Musculoskeletal negative musculoskeletal ROS (+)   Abdominal   Peds  Hematology negative hematology ROS (+)   Anesthesia Other Findings   Reproductive/Obstetrics                             Anesthesia Physical Anesthesia Plan  ASA: III  Anesthesia Plan: MAC   Post-op Pain Management:    Induction: Intravenous  Airway Management Planned: Nasal Cannula  Additional Equipment:   Intra-op Plan:   Post-operative Plan:   Informed Consent: I have reviewed the patients History and Physical, chart, labs and discussed the procedure including the risks, benefits and alternatives for the proposed anesthesia with the patient or authorized representative who has indicated his/her understanding and acceptance.     Plan Discussed with:   Anesthesia Plan Comments:         Anesthesia Quick Evaluation

## 2015-03-21 NOTE — Anesthesia Postprocedure Evaluation (Signed)
  Anesthesia Post-op Note  Patient: [name]  Procedure(s) Performed: Procedure(s) (LRB): CATARACT EXTRACTION PHACO AND INTRAOCULAR LENS PLACEMENT RIGHT EYE (Right)  Patient Location:  Short Stay  Anesthesia Type: MAC  Level of Consciousness: awake  Airway and Oxygen Therapy: Patient Spontanous Breathing  Post-op Pain: none  Post-op Assessment: Post-op Vital signs reviewed, Patient's Cardiovascular Status Stable, Respiratory Function Stable, Patent Airway, No signs of Nausea or vomiting and Pain level controlled  Post-op Vital Signs: Reviewed and stable  Complications: No apparent anesthesia complications

## 2015-03-21 NOTE — H&P (Signed)
I have reviewed the H and P.  The patient was examined and no changes were noted in the patient's health status.

## 2015-03-21 NOTE — Anesthesia Procedure Notes (Signed)
Procedure Name: MAC Date/Time: 03/21/2015 12:41 PM Performed by: Vista Deck Pre-anesthesia Checklist: Patient identified, Emergency Drugs available, Suction available, Timeout performed and Patient being monitored Patient Re-evaluated:Patient Re-evaluated prior to inductionOxygen Delivery Method: Nasal Cannula

## 2015-03-21 NOTE — Transfer of Care (Signed)
Immediate Anesthesia Transfer of Care Note  Patient: [name]  Procedure(s) Performed: Procedure(s) (LRB): CATARACT EXTRACTION PHACO AND INTRAOCULAR LENS PLACEMENT RIGHT EYE (Right)  Patient Location: Shortstay  Anesthesia Type: MAC  Level of Consciousness: awake  Airway & Oxygen Therapy: Patient Spontanous Breathing   Post-op Assessment: Report given to PACU RN, Post -op Vital signs reviewed and stable and Patient moving all extremities  Post vital signs: Reviewed and stable  Complications: No apparent anesthesia complications

## 2015-03-22 ENCOUNTER — Encounter (HOSPITAL_COMMUNITY): Payer: Self-pay | Admitting: Ophthalmology

## 2015-05-06 ENCOUNTER — Other Ambulatory Visit: Payer: Self-pay | Admitting: Cardiovascular Disease

## 2015-05-06 DIAGNOSIS — I739 Peripheral vascular disease, unspecified: Secondary | ICD-10-CM

## 2015-05-08 ENCOUNTER — Encounter (HOSPITAL_COMMUNITY): Payer: Self-pay

## 2015-05-08 ENCOUNTER — Encounter (HOSPITAL_COMMUNITY)
Admission: RE | Admit: 2015-05-08 | Discharge: 2015-05-08 | Disposition: A | Discharge status: Home / Self Care | Payer: Medicare Other | Source: Ambulatory Visit | Attending: Ophthalmology | Admitting: Ophthalmology

## 2015-05-13 ENCOUNTER — Encounter (HOSPITAL_COMMUNITY): Admission: RE | Admit: 2015-05-13 | Payer: Medicare Other | Source: Ambulatory Visit

## 2015-05-15 MED ORDER — LIDOCAINE HCL (PF) 1 % IJ SOLN
INTRAMUSCULAR | Status: AC
  Filled 2015-05-15: qty 2

## 2015-05-15 MED ORDER — PHENYLEPHRINE HCL 2.5 % OP SOLN
OPHTHALMIC | Status: AC
Start: 2015-05-15 — End: 2015-05-15
  Filled 2015-05-15: qty 15

## 2015-05-15 MED ORDER — TETRACAINE HCL 0.5 % OP SOLN
OPHTHALMIC | Status: AC
  Filled 2015-05-15: qty 2

## 2015-05-15 MED ORDER — LIDOCAINE HCL 3.5 % OP GEL
OPHTHALMIC | Status: AC
  Filled 2015-05-15: qty 1

## 2015-05-15 MED ORDER — CYCLOPENTOLATE-PHENYLEPHRINE OP SOLN OPTIME - NO CHARGE
OPHTHALMIC | Status: AC
  Filled 2015-05-15: qty 2

## 2015-05-15 MED ORDER — NEOMYCIN-POLYMYXIN-DEXAMETH 3.5-10000-0.1 OP SUSP
OPHTHALMIC | Status: AC
  Filled 2015-05-15: qty 5

## 2015-05-16 ENCOUNTER — Ambulatory Visit (HOSPITAL_COMMUNITY): Payer: Medicare Other | Admitting: Anesthesiology

## 2015-05-16 ENCOUNTER — Ambulatory Visit (HOSPITAL_COMMUNITY)
Admission: RE | Admit: 2015-05-16 | Discharge: 2015-05-16 | Disposition: A | Discharge status: Home / Self Care | Payer: Medicare Other | Source: Ambulatory Visit | Attending: Ophthalmology | Admitting: Ophthalmology

## 2015-05-16 ENCOUNTER — Encounter (HOSPITAL_COMMUNITY)
Admission: RE | Disposition: A | Discharge status: Home / Self Care | Payer: Self-pay | Source: Ambulatory Visit | Attending: Ophthalmology

## 2015-05-16 ENCOUNTER — Encounter (HOSPITAL_COMMUNITY): Payer: Self-pay | Admitting: Emergency Medicine

## 2015-05-16 DIAGNOSIS — H269 Unspecified cataract: Secondary | ICD-10-CM | POA: Insufficient documentation

## 2015-05-16 HISTORY — PX: CATARACT EXTRACTION W/PHACO: SHX586

## 2015-05-16 LAB — GLUCOSE, CAPILLARY: Glucose-Capillary: 109 mg/dL — ABNORMAL HIGH (ref 65–99)

## 2015-05-16 SURGERY — PHACOEMULSIFICATION, CATARACT, WITH IOL INSERTION
Anesthesia: Monitor Anesthesia Care | Site: Eye | Laterality: Left

## 2015-05-16 MED ORDER — MIDAZOLAM HCL 2 MG/2ML IJ SOLN
INTRAMUSCULAR | Status: AC
  Filled 2015-05-16: qty 2

## 2015-05-16 MED ORDER — CYCLOPENTOLATE-PHENYLEPHRINE 0.2-1 % OP SOLN
1.0000 [drp] | OPHTHALMIC | Status: AC | PRN
  Administered 2015-05-16 (×3): 1 [drp] via OPHTHALMIC

## 2015-05-16 MED ORDER — LIDOCAINE HCL 3.5 % OP GEL
1.0000 "application " | Freq: Once | OPHTHALMIC | Status: AC
  Administered 2015-05-16: 1 via OPHTHALMIC

## 2015-05-16 MED ORDER — FENTANYL CITRATE (PF) 100 MCG/2ML IJ SOLN
INTRAMUSCULAR | Status: AC
  Filled 2015-05-16: qty 2

## 2015-05-16 MED ORDER — SODIUM HYALURONATE 23 MG/ML IO SOLN
INTRAOCULAR | Status: DC | PRN
  Administered 2015-05-16: 0.6 mL via INTRAOCULAR

## 2015-05-16 MED ORDER — LIDOCAINE HCL (PF) 1 % IJ SOLN
INTRAMUSCULAR | Status: DC | PRN
  Administered 2015-05-16: .6 mL

## 2015-05-16 MED ORDER — EPINEPHRINE HCL 1 MG/ML IJ SOLN
INTRAMUSCULAR | Status: AC
  Filled 2015-05-16: qty 1

## 2015-05-16 MED ORDER — EPINEPHRINE HCL 1 MG/ML IJ SOLN
INTRAOCULAR | Status: DC | PRN
  Administered 2015-05-16: 500 mL

## 2015-05-16 MED ORDER — PROVISC 10 MG/ML IO SOLN
INTRAOCULAR | Status: DC | PRN
  Administered 2015-05-16: .85 mL via INTRAOCULAR

## 2015-05-16 MED ORDER — FENTANYL CITRATE (PF) 100 MCG/2ML IJ SOLN
25.0000 ug | INTRAMUSCULAR | Status: AC
  Administered 2015-05-16 (×2): 25 ug via INTRAVENOUS

## 2015-05-16 MED ORDER — BSS IO SOLN
INTRAOCULAR | Status: DC | PRN
  Administered 2015-05-16: 15 mL

## 2015-05-16 MED ORDER — LACTATED RINGERS IV SOLN
INTRAVENOUS | Status: DC
  Administered 2015-05-16: 75 mL/h via INTRAVENOUS

## 2015-05-16 MED ORDER — NEOMYCIN-POLYMYXIN-DEXAMETH 0.1 % OP SUSP
OPHTHALMIC | Status: DC | PRN
  Administered 2015-05-16: 1 [drp] via OPHTHALMIC

## 2015-05-16 MED ORDER — PHENYLEPHRINE HCL 2.5 % OP SOLN
1.0000 [drp] | OPHTHALMIC | Status: AC
  Administered 2015-05-16 (×3): 1 [drp] via OPHTHALMIC

## 2015-05-16 MED ORDER — TETRACAINE HCL 0.5 % OP SOLN
1.0000 [drp] | OPHTHALMIC | Status: AC
  Administered 2015-05-16 (×3): 1 [drp] via OPHTHALMIC

## 2015-05-16 MED ORDER — MIDAZOLAM HCL 2 MG/2ML IJ SOLN
1.0000 mg | INTRAMUSCULAR | Status: DC | PRN
  Administered 2015-05-16 (×2): 1 mg via INTRAVENOUS

## 2015-05-16 MED ORDER — POVIDONE-IODINE 5 % OP SOLN
OPHTHALMIC | Status: DC | PRN
  Administered 2015-05-16: 1 via OPHTHALMIC

## 2015-05-16 MED FILL — Neomycin-Polymyxin-Dexamethasone Ophth Susp 0.1%: OPHTHALMIC | Qty: 5 | Status: AC

## 2015-05-16 SURGICAL SUPPLY — 17 items
CLOTH BEACON ORANGE TIMEOUT ST (SAFETY) ×2 IMPLANT
EYE SHIELD UNIVERSAL CLEAR (GAUZE/BANDAGES/DRESSINGS) ×2 IMPLANT
GLOVE BIOGEL PI IND STRL 6.5 (GLOVE) IMPLANT
GLOVE BIOGEL PI IND STRL 7.0 (GLOVE) IMPLANT
GLOVE BIOGEL PI IND STRL 7.5 (GLOVE) IMPLANT
GLOVE BIOGEL PI INDICATOR 6.5 (GLOVE)
GLOVE BIOGEL PI INDICATOR 7.0 (GLOVE) ×2
GLOVE BIOGEL PI INDICATOR 7.5 (GLOVE)
GLOVE EXAM NITRILE LRG STRL (GLOVE) ×2 IMPLANT
GLOVE EXAM NITRILE MD LF STRL (GLOVE) IMPLANT
PAD ARMBOARD 7.5X6 YLW CONV (MISCELLANEOUS) ×2 IMPLANT
SIGHTPATH CAT PROC W REG LENS (Ophthalmic Related) ×2 IMPLANT
SYRINGE 1CC 25X5/8 TB ECLIPSE (MISCELLANEOUS) ×2 IMPLANT
TAPE SURG TRANSPORE 1 IN (GAUZE/BANDAGES/DRESSINGS) IMPLANT
TAPE SURGICAL TRANSPORE 1 IN (GAUZE/BANDAGES/DRESSINGS) ×2
VISCOELASTIC ADDITIONAL (OPHTHALMIC RELATED) ×3 IMPLANT
WATER STERILE IRR 250ML POUR (IV SOLUTION) ×2 IMPLANT

## 2015-05-16 NOTE — Discharge Instructions (Signed)
Monitored Anesthesia Care °Monitored anesthesia care is an anesthesia service for a medical procedure. Anesthesia is the loss of the ability to feel pain. It is produced by medicines called anesthetics. It may affect a small area of your body (local anesthesia), a large area of your body (regional anesthesia), or your entire body (general anesthesia). The need for monitored anesthesia care depends your procedure, your condition, and the potential need for regional or general anesthesia. It is often provided during procedures where:  °· General anesthesia may be needed if there are complications. This is because you need special care when you are under general anesthesia.   °· You will be under local or regional anesthesia. This is so that you are able to have higher levels of anesthesia if needed.   °· You will receive calming medicines (sedatives). This is especially the case if sedatives are given to put you in a semi-conscious state of relaxation (deep sedation). This is because the amount of sedative needed to produce this state can be hard to predict. Too much of a sedative can produce general anesthesia. °Monitored anesthesia care is performed by one or more health care providers who have special training in all types of anesthesia. You will need to meet with these health care providers before your procedure. During this meeting, they will ask you about your medical history. They will also give you instructions to follow. (For example, you will need to stop eating and drinking before your procedure. You may also need to stop or change medicines you are taking.) During your procedure, your health care providers will stay with you. They will:  °· Watch your condition. This includes watching your blood pressure, breathing, and level of pain.   °· Diagnose and treat problems that occur.   °· Give medicines if they are needed. These may include calming medicines (sedatives) and anesthetics.   °· Make sure you are  comfortable.   °Having monitored anesthesia care does not necessarily mean that you will be under anesthesia. It does mean that your health care providers will be able to manage anesthesia if you need it or if it occurs. It also means that you will be able to have a different type of anesthesia than you are having if you need it. When your procedure is complete, your health care providers will continue to watch your condition. They will make sure any medicines wear off before you are allowed to go home.  °Document Released: 09/02/2005 Document Revised: 04/23/2014 Document Reviewed: 01/18/2013 °[employer]® Patient Information ©2015 [employer]. This information is not intended to replace advice given to you by your health care provider. Make sure you discuss any questions you have with your health care provider. ° °

## 2015-05-16 NOTE — Op Note (Signed)
Date of procedure:   Pre-operative diagnosis: Visually significant cataract, Left Eye  Post-operative diagnosis: Visually significant cataract, Left Eye  Procedure: Removal of cataract via phacoemulsification and insertion of intra-ocular lens Hoya 250 +27.5D into the capsular bag of the Left Eye  Attending surgeon: Gerda Diss. Lavell Supple, MD, MA  Anesthesia: MAC, Topical 0.75% Bupivicaine  Complications: None  Estimated Blood Loss: <80mL (minimal)  Specimens: None  Implants: As above  Indications:  Visually significant cataract, Left Eye  Procedure:  The patient was seen and identified in the pre-operative area. The operative eye was identified and dilated.  The operative eye was marked.  Topical 0.75% Bupivicaine was administered to the operative eye.     The patient was then to the operative suite and placed in the supine position.  A timeout was performed confirming the patient, procedure to be performed, and all other relevant information.   The patient's face was prepped and draped in the usual fashion for intra-ocular surgery.  A lid speculum was placed into the operative eye and the surgical microscope moved into place and focused.  A superotemporal paracentesis was created using a 20-gauge MVR blade.  Shugarcaine was injected into the anterior chamber.  Viscoelastic was injected into the anterior chamber.  A temporal clear-corneal main wound incision was created using a 2.10mm microkeratome.  A continuous curvilinear capsulorrhexis was initiated using an irrigating cystitome and completed using capsulorrhexis forceps.  Hydrodissection and hydrodeliniation were performed.  Viscoelastic was injected into the anterior chamber.  A phacoemulsification handpiece and a chopper as a second instrument were used to remove the nucleus and epinucleus. The irrigation/aspiration handpiece was used to remove any remaining cortical material.   The capsular bag was reinflated with viscoelastic, checked,  and found to be intact. An Hoya 250 intraocular lens with a +27.5D power was inserted into the capsular bag and dialed into place using a Sinskey hook.  The irrigation/aspiration handpiece was used to remove any remaining viscoelastic. Miochol was injected into the eye.  The clear corneal wound and paracentesis wounds were then hydrated and checked with Weck-Cels to be watertight.  The lid-speculum and drape was removed, and the patient's face was cleaned with a wet and dry 4x4.  Vigamox was instilled in the eye before a clear shield was taped over the eye. The patient was taken to the post-operative care unit in good condition, having tolerated the procedure well.  Post-Op Instructions: The patient will follow up at [facility] for a same day post-operative evaluation and will receive all other orders and instructions.

## 2015-05-16 NOTE — Anesthesia Procedure Notes (Signed)
Procedure Name: MAC Date/Time: 05/16/2015 8:11 AM Performed by: Andree Elk, Mekaylah Klich A Pre-anesthesia Checklist: Patient identified, Timeout performed, Emergency Drugs available, Suction available and Patient being monitored Oxygen Delivery Method: Nasal cannula

## 2015-05-16 NOTE — Anesthesia Postprocedure Evaluation (Signed)
  Anesthesia Post-op Note  Patient: [name]  Procedure(s) Performed: Procedure(s) with comments: CATARACT EXTRACTION PHACO AND INTRAOCULAR LENS PLACEMENT (IOC) (Left) - CDE 5.57  Patient Location: Short Stay  Anesthesia Type:MAC  Level of Consciousness: awake, alert , oriented and patient cooperative  Airway and Oxygen Therapy: Patient Spontanous Breathing  Post-op Pain: none  Post-op Assessment: Post-op Vital signs reviewed, Patient's Cardiovascular Status Stable, Respiratory Function Stable, Patent Airway, No signs of Nausea or vomiting and Pain level controlled  Post-op Vital Signs: Reviewed and stable  Last Vitals:  Filed Vitals:   05/16/15 0800  BP: 146/69  Pulse:   Temp:   Resp: 11    Complications: No apparent anesthesia complications

## 2015-05-16 NOTE — H&P (Signed)
The H and P was reviewed and updated.  No changes were found after exam.  The surgical eye was marked.  

## 2015-05-16 NOTE — Anesthesia Preprocedure Evaluation (Signed)
Anesthesia Evaluation  Patient identified by MRN, date of birth, ID band Patient awake    Reviewed: Allergy & Precautions, H&P , NPO status , Patient's Chart, lab work & pertinent test results  Airway Mallampati: II  TM Distance: >3 FB     Dental  (+) Edentulous Upper, Edentulous Lower   Pulmonary former smoker,  breath sounds clear to auscultation        Cardiovascular hypertension, Pt. on medications + CAD, + Past MI and + Peripheral Vascular Disease + dysrhythmias Atrial Fibrillation Rhythm:Regular Rate:Normal     Neuro/Psych CVA, No Residual Symptoms negative psych ROS   GI/Hepatic Neg liver ROS, GERD-  Medicated and Controlled,  Endo/Other  negative endocrine ROS  Renal/GU negative Renal ROS  negative genitourinary   Musculoskeletal negative musculoskeletal ROS (+)   Abdominal   Peds  Hematology negative hematology ROS (+)   Anesthesia Other Findings   Reproductive/Obstetrics                             Anesthesia Physical Anesthesia Plan  ASA: III  Anesthesia Plan: MAC   Post-op Pain Management:    Induction: Intravenous  Airway Management Planned: Nasal Cannula  Additional Equipment:   Intra-op Plan:   Post-operative Plan:   Informed Consent: I have reviewed the patients History and Physical, chart, labs and discussed the procedure including the risks, benefits and alternatives for the proposed anesthesia with the patient or authorized representative who has indicated his/her understanding and acceptance.     Plan Discussed with:   Anesthesia Plan Comments:         Anesthesia Quick Evaluation

## 2015-05-16 NOTE — Transfer of Care (Signed)
Immediate Anesthesia Transfer of Care Note  Patient: [name]  Procedure(s) Performed: Procedure(s) with comments: CATARACT EXTRACTION PHACO AND INTRAOCULAR LENS PLACEMENT (IOC) (Left) - CDE 5.57  Patient Location: Short Stay  Anesthesia Type:MAC  Level of Consciousness: awake, alert , oriented and patient cooperative  Airway & Oxygen Therapy: Patient Spontanous Breathing  Post-op Assessment: Report given to RN and Post -op Vital signs reviewed and stable  Post vital signs: Reviewed and stable  Last Vitals:  Filed Vitals:   05/16/15 0800  BP: 146/69  Pulse:   Temp:   Resp: 11    Complications: No apparent anesthesia complications

## 2015-05-17 ENCOUNTER — Encounter (HOSPITAL_COMMUNITY): Payer: Self-pay | Admitting: Ophthalmology

## 2015-05-28 ENCOUNTER — Encounter (HOSPITAL_COMMUNITY): Payer: Medicare Other

## 2015-06-03 ENCOUNTER — Ambulatory Visit (HOSPITAL_COMMUNITY)
Admission: RE | Admit: 2015-06-03 | Discharge: 2015-06-03 | Disposition: A | Discharge status: Home / Self Care | Payer: Medicare Other | Source: Ambulatory Visit | Attending: Internal Medicine | Admitting: Internal Medicine

## 2015-06-03 ENCOUNTER — Other Ambulatory Visit: Payer: Self-pay | Admitting: Cardiovascular Disease

## 2015-06-03 DIAGNOSIS — T82598A Other mechanical complication of other cardiac and vascular devices and implants, initial encounter: Secondary | ICD-10-CM | POA: Insufficient documentation

## 2015-06-03 DIAGNOSIS — I70203 Unspecified atherosclerosis of native arteries of extremities, bilateral legs: Secondary | ICD-10-CM | POA: Diagnosis not present

## 2015-06-03 DIAGNOSIS — I739 Peripheral vascular disease, unspecified: Secondary | ICD-10-CM | POA: Diagnosis present

## 2015-06-03 DIAGNOSIS — Y838 Other surgical procedures as the cause of abnormal reaction of the patient, or of later complication, without mention of misadventure at the time of the procedure: Secondary | ICD-10-CM | POA: Insufficient documentation

## 2015-06-03 NOTE — Progress Notes (Signed)
Lower Ext. Arterial Duplex Completed. Preliminary results by tech - Bilateral SFA stents appeared occluded. The right sided ABI is 0.50 and the left sided ABI 0.66. Oda Cogan, BS, RDMS, RVT

## 2015-06-11 ENCOUNTER — Telehealth: Payer: Self-pay | Admitting: Cardiovascular Disease

## 2015-06-11 NOTE — Telephone Encounter (Signed)
[name] is calling back about her results .Marland Kitchen Please call    Thanks

## 2015-06-11 NOTE — Telephone Encounter (Signed)
Pt called in wanting to speak with Dr. Kennon Holter nurse about the results from for doppler that was done on 6/13. She was told that some of her stents had collapsed. Please f/u with pt  Thanks

## 2015-06-11 NOTE — Telephone Encounter (Signed)
Patient notified of results - she reports vascular tech reported to her during the test that it looked like some of her stents collapsed.   She is aware Curt Bears, RN will call her back to schedule a follow up with Dr. [name]

## 2015-06-12 NOTE — Telephone Encounter (Signed)
Spoke with patient and made her appt to see Dr [name]

## 2015-06-18 ENCOUNTER — Ambulatory Visit (INDEPENDENT_AMBULATORY_CARE_PROVIDER_SITE_OTHER): Payer: Medicare Other | Admitting: Cardiovascular Disease

## 2015-06-18 ENCOUNTER — Telehealth: Payer: Self-pay | Admitting: Cardiovascular Disease

## 2015-06-18 ENCOUNTER — Encounter: Payer: Self-pay | Admitting: Cardiovascular Disease

## 2015-06-18 VITALS — BP 176/92 | HR 74 | Ht 65.5 in | Wt 150.0 lb

## 2015-06-18 DIAGNOSIS — I4891 Unspecified atrial fibrillation: Secondary | ICD-10-CM

## 2015-06-18 DIAGNOSIS — D689 Coagulation defect, unspecified: Secondary | ICD-10-CM

## 2015-06-18 DIAGNOSIS — I739 Peripheral vascular disease, unspecified: Secondary | ICD-10-CM

## 2015-06-18 DIAGNOSIS — I480 Paroxysmal atrial fibrillation: Secondary | ICD-10-CM

## 2015-06-18 DIAGNOSIS — I2510 Atherosclerotic heart disease of native coronary artery without angina pectoris: Secondary | ICD-10-CM

## 2015-06-18 DIAGNOSIS — E785 Hyperlipidemia, unspecified: Secondary | ICD-10-CM | POA: Diagnosis not present

## 2015-06-18 DIAGNOSIS — R5383 Other fatigue: Secondary | ICD-10-CM

## 2015-06-18 DIAGNOSIS — Z79899 Other long term (current) drug therapy: Secondary | ICD-10-CM | POA: Diagnosis not present

## 2015-06-18 DIAGNOSIS — I2583 Coronary atherosclerosis due to lipid rich plaque: Secondary | ICD-10-CM

## 2015-06-18 DIAGNOSIS — Z01818 Encounter for other preprocedural examination: Secondary | ICD-10-CM

## 2015-06-18 NOTE — Telephone Encounter (Signed)
Called Adrian Blackwater [place]) and gave him the appt time and date for upcoming procedure (peripheral angiogram).

## 2015-06-18 NOTE — Assessment & Plan Note (Signed)
History of moderate left ICA stenosis by duplex ultrasound ultrasound 02/06/15. She does complain of some left-sided TIA type symptoms. We will recheck carotid Doppler studies

## 2015-06-18 NOTE — Assessment & Plan Note (Signed)
History of coronary artery disease with catheterization performed by Dr. Ellyn Hack that showed noncritical CAD. She denies chest pain or shortness of breath.

## 2015-06-18 NOTE — Assessment & Plan Note (Signed)
History of PAD with remote left iliac stenting and known left SFA occlusion with remote right SFA intervention and re-intervention for in-stent restenosis in 2008. She has 3 overlapping nitinol self-expanding stents in her right SFA. Her right ABI has decreased over the last year from 0.81 on 05/10/14.50 on 06/03/15 with an occluded right SFA suggesting "in-stent restenosis". This is going on since the end of last year. She was symptomatic. We will arrange for angiography and intervention most likely requiring BiPAP on coverage stenting. She will need to stop her oral AC 2 days prior to the procedure.

## 2015-06-18 NOTE — Progress Notes (Signed)
06/18/2015 [name]   1951-10-01  [id number]  Primary Physician Octavio Graves, DO Primary Cardiologist: Lorretta Harp MD [name]   HPI:   The patient is a 64 year old, thin-appearing, widowed Caucasian female, mother of 2 children, grandmother to 5 grandchildren and 3 step-grandchildren who she has raised. I saw her 10 months ago. She has a history of PVOD status post left external iliac artery PTA and stenting as well as right SFA stenting by myself August 2007 with a known occluded left SFA. Her other problems include recently discontinued tobacco abuse November 11, 2012, hypertension and hyperlipidemia. She is statin intolerant. She has had PAF in the past and has been on Coumadin anticoagulation which was stopped because of nosebleeds and changed to Eliquis . She really denies chest pain, shortness of breath, or claudication. Her last Myoview performed December 27, 2009, was nonischemic. Dopplers performed a year ago showed patent stents with right ABI of 0.87 and a left of 0.65 .She had cardiac catheterization performed by Dr. Ellyn Hack revealed noncritical CAD.since I saw her back in September of last year she developed recurrent right calf claudication beginning in November or December of last year. Most recent Doppler show  A decline in her ABI from 0.1-0.5 with an occluded right SFA stent suggesting in-stent restenosis.   Current Outpatient Prescriptions  Medication Sig Dispense Refill  . acetaminophen (TYLENOL) 500 MG tablet Take 500 mg by mouth every 6 (six) hours as needed for mild pain.     Marland Kitchen BESIVANCE 0.6 % SUSP Place 1 drop into both eyes as directed. Before procedure.    . diltiazem (CARDIZEM CD) 180 MG 24 hr capsule Take 180 mg by mouth every morning.    . docusate sodium (COLACE) 100 MG capsule Take 200 mg by mouth daily.     . DUREZOL 0.05 % EMUL Place 1 drop into both eyes as directed. Before procedure.    Marland Kitchen ELIQUIS 5 MG TABS tablet TAKE ONE TABLET BY  MOUTH TWICE A DAY AS DIRECTED 60 tablet 5  . fenofibrate (TRICOR) 145 MG tablet TAKE ONE TABLET BY MOUTH AT BEDTIME 30 tablet 5  . flecainide (TAMBOCOR) 50 MG tablet TAKE ONE TABLET BY MOUTH TWICE DAILY 60 tablet 5  . fluticasone (FLONASE) 50 MCG/ACT nasal spray Place 2 sprays into both nostrils daily as needed for allergies.     . hydrochlorothiazide (HYDRODIURIL) 25 MG tablet Take 25 mg by mouth daily.     Marland Kitchen ibuprofen (ADVIL,MOTRIN) 200 MG tablet Take 200 mg by mouth every 6 (six) hours as needed.    . meclizine (ANTIVERT) 25 MG tablet Take 25 mg by mouth daily as needed for dizziness or nausea.     Marland Kitchen omeprazole (PRILOSEC) 20 MG capsule Take 20 mg by mouth 2 (two) times daily.    Vladimir Faster Glycol-Propyl Glycol (SYSTANE OP) Place 1 drop into both eyes 4 (four) times daily as needed (dryness).    . polyethylene glycol (MIRALAX / GLYCOLAX) packet Take 17 g by mouth daily as needed (constipation).    . pravastatin (PRAVACHOL) 40 MG tablet Take 1 tablet (40 mg total) by mouth every evening. 30 tablet 8  . PROLENSA 0.07 % SOLN Place 1 drop into both eyes as directed. Before procedure.    . traMADol (ULTRAM) 50 MG tablet Take 50 mg by mouth 3 (three) times daily as needed.     Marland Kitchen ZETIA 10 MG tablet TAKE ONE TABLET BY MOUTH AT BEDTIME 30 tablet  5   No current facility-administered medications for this visit.    Allergies  Allergen Reactions  . Crestor [Rosuvastatin] Other (See Comments)    Itchy rash  . Demerol [Meperidine] Other (See Comments)    Unknown, can't remember   . Effexor [Venlafaxine Hcl]     Doesn't remember   . Inderal [Propranolol]     Doesn't remember   . Lovaza [Omega-3-Acid Ethyl Esters]     Doesn't remember   . Penicillins Other (See Comments)    Questionable high fever when taken and broke out in whelps.   Richardo Hanks [Oxycodone-Acetaminophen]     Doesn't remember   . Pravastatin Itching  . Warfarin And Related Other (See Comments)    Caused nose bleeds  . Atenolol  Rash  . Lipitor [Atorvastatin] Rash  . Nexium [Esomeprazole Magnesium] Rash  . Plavix [Clopidogrel Bisulfate] Rash  . Simvastatin Itching and Rash    Also lipitor intolerant  . Trilipix [Choline Fenofibrate] Other (See Comments)    Doesn't remember     History   Social History  . Marital Status: Widowed    Spouse Name: N/A  . Number of Children: 2  . Years of Education: 12   Occupational History  . CNA    Social History Main Topics  . Smoking status: Former Smoker -- 1.00 packs/day for 45 years    Types: Cigarettes    Quit date: 11/11/2012  . Smokeless tobacco: Never Used  . Alcohol Use: No  . Drug Use: No  . Sexual Activity: No   Other Topics Concern  . Not on file   Social History Narrative     Review of Systems: General: negative for chills, fever, night sweats or weight changes.  Cardiovascular: negative for chest pain, dyspnea on exertion, edema, orthopnea, palpitations, paroxysmal nocturnal dyspnea or shortness of breath Dermatological: negative for rash Respiratory: negative for cough or wheezing Urologic: negative for hematuria Abdominal: negative for nausea, vomiting, diarrhea, bright red blood per rectum, melena, or hematemesis Neurologic: negative for visual changes, syncope, or dizziness All other systems reviewed and are otherwise negative except as noted above.    Blood pressure 176/92, pulse 74, height 5' 5.5" (1.664 m), weight 150 lb (68.04 kg).  General appearance: alert and no distress Neck: no adenopathy, no JVD, supple, symmetrical, trachea midline, thyroid not enlarged, symmetric, no tenderness/mass/nodules and ssoft bilateral carotid bruits Lungs: clear to auscultation bilaterally Heart: regular rate and rhythm, S1, S2 normal, no murmur, click, rub or gallop Extremities: extremities normal, atraumatic, no cyanosis or edema  EKG normal sinus rhythm at 70 without ST or T-wave changes. I personally reviewed this EKG  ASSESSMENT AND PLAN:    PAF (paroxysmal atrial fibrillation), maintaing SR History of PAF on flecainide and Eliquis   PAD (peripheral artery disease), Lt carotid 50-70%: , Hx stent Lt exteral iliac & RSFA stent, known occl. LSFA  History of PAD with remote left iliac stenting and known left SFA occlusion with remote right SFA intervention and re-intervention for in-stent restenosis in 2008. She has 3 overlapping nitinol self-expanding stents in her right SFA. Her right ABI has decreased over the last year from 0.81 on 05/10/14.50 on 06/03/15 with an occluded right SFA suggesting "in-stent restenosis". This is going on since the end of last year. She was symptomatic. We will arrange for angiography and intervention most likely requiring BiPAP on coverage stenting. She will need to stop her oral AC 2 days prior to the procedure.  Hyperlipidemia History of hyperlipidemia  on Zetia and pravastatin. We'll recheck a lipid and liver profile  Carotid artery disease History of moderate left ICA stenosis by duplex ultrasound ultrasound 02/06/15. She does complain of some left-sided TIA type symptoms. We will recheck carotid Doppler studies  CAD (coronary artery disease), per cath 04/27/13, non obstructive disease 20-30% LM; LAD 30%; 50-60% OM2; RCA 40%; EF 65-70% History of coronary artery disease with catheterization performed by Dr. Ellyn Hack that showed noncritical CAD. She denies chest pain or shortness of breath.      Lorretta Harp MD FACP,FACC,FAHA, [facility] 06/18/2015 2:43 PM

## 2015-06-18 NOTE — Assessment & Plan Note (Signed)
History of hyperlipidemia on Zetia and pravastatin. We'll recheck a lipid and liver profile

## 2015-06-18 NOTE — Patient Instructions (Signed)
Your physician has requested that you have a carotid duplex. This test is an ultrasound of the carotid arteries in your neck. It looks at blood flow through these arteries that supply the brain with blood. Allow one hour for this exam. There are no restrictions or special instructions.  Dr. [name] has ordered a peripheral angiogram to be done at [facility] next week.  This procedure is going to look at the bloodflow in your lower extremities.  If Dr. [name] is able to open up the arteries, you will have to spend one night in the hospital.  If he is not able to open the arteries, you will be able to go home that same day.   After the procedure, you will not be allowed to drive for 3 days or push, pull, or lift anything greater than 10 lbs for one week.    **YOU WILL NEED TO STOP YOUR ELIQUIS 2 DAYS PRIOR TO THE PROCEDURE**  You will be required to have the following tests prior to the procedure: 1. Blood work-the blood work can be done no more than 7 days prior to the procedure.  It can be done at any [facility] lab.  There is one downstairs on the first floor of this building and one on [address], phone number [phone] 2. Chest Xray-the chest xray order has already been placed at [facility] in the [place].     *REPS [name] and [place]  left groin access

## 2015-06-18 NOTE — Assessment & Plan Note (Signed)
>>  ASSESSMENT AND PLAN FOR MIXED HYPERLIPIDEMIA WRITTEN ON 06/18/2015  2:41 PM BY BERRY, JONATHAN J, MD  History of hyperlipidemia on Zetia  and pravastatin . We'll recheck a lipid and liver profile

## 2015-06-18 NOTE — Assessment & Plan Note (Signed)
History of PAF on flecainide and Eliquis

## 2015-06-21 ENCOUNTER — Other Ambulatory Visit: Payer: Self-pay | Admitting: Cardiovascular Disease

## 2015-06-21 NOTE — Telephone Encounter (Signed)
Rx(s) sent to pharmacy electronically.  

## 2015-06-25 ENCOUNTER — Ambulatory Visit (HOSPITAL_COMMUNITY)
Admission: RE | Admit: 2015-06-25 | Discharge: 2015-06-25 | Disposition: A | Discharge status: Home / Self Care | Payer: Medicare Other | Source: Ambulatory Visit | Attending: Cardiovascular Disease | Admitting: Cardiovascular Disease

## 2015-06-25 ENCOUNTER — Other Ambulatory Visit: Payer: Self-pay | Admitting: *Deleted

## 2015-06-25 ENCOUNTER — Other Ambulatory Visit (HOSPITAL_COMMUNITY)
Admission: RE | Admit: 2015-06-25 | Discharge: 2015-06-25 | Disposition: A | Discharge status: Home / Self Care | Payer: Medicare Other | Source: Ambulatory Visit | Attending: Cardiovascular Disease | Admitting: Cardiovascular Disease

## 2015-06-25 DIAGNOSIS — I10 Essential (primary) hypertension: Secondary | ICD-10-CM | POA: Insufficient documentation

## 2015-06-25 DIAGNOSIS — I2510 Atherosclerotic heart disease of native coronary artery without angina pectoris: Secondary | ICD-10-CM | POA: Insufficient documentation

## 2015-06-25 DIAGNOSIS — Z01812 Encounter for preprocedural laboratory examination: Secondary | ICD-10-CM | POA: Diagnosis present

## 2015-06-25 DIAGNOSIS — Z7901 Long term (current) use of anticoagulants: Secondary | ICD-10-CM | POA: Diagnosis not present

## 2015-06-25 DIAGNOSIS — I4891 Unspecified atrial fibrillation: Secondary | ICD-10-CM | POA: Insufficient documentation

## 2015-06-25 DIAGNOSIS — Z87891 Personal history of nicotine dependence: Secondary | ICD-10-CM | POA: Diagnosis not present

## 2015-06-25 DIAGNOSIS — R079 Chest pain, unspecified: Secondary | ICD-10-CM | POA: Diagnosis not present

## 2015-06-25 DIAGNOSIS — Z01811 Encounter for preprocedural respiratory examination: Secondary | ICD-10-CM

## 2015-06-25 DIAGNOSIS — Z01818 Encounter for other preprocedural examination: Secondary | ICD-10-CM | POA: Diagnosis not present

## 2015-06-25 DIAGNOSIS — I739 Peripheral vascular disease, unspecified: Secondary | ICD-10-CM

## 2015-06-25 LAB — HEPATIC FUNCTION PANEL
ALT: 20 U/L (ref 14–54)
AST: 24 U/L (ref 15–41)
Albumin: 4.2 g/dL (ref 3.5–5.0)
Alkaline Phosphatase: 74 U/L (ref 38–126)
BILIRUBIN INDIRECT: 0.6 mg/dL (ref 0.3–0.9)
Bilirubin, Direct: 0.1 mg/dL (ref 0.1–0.5)
TOTAL PROTEIN: 7.8 g/dL (ref 6.5–8.1)
Total Bilirubin: 0.7 mg/dL (ref 0.3–1.2)

## 2015-06-25 LAB — LIPID PANEL
CHOLESTEROL: 156 mg/dL (ref 0–200)
HDL: 36 mg/dL — ABNORMAL LOW (ref >40)
LDL CALC: 48 mg/dL (ref 0–99)
Total CHOL/HDL Ratio: 4.3 RATIO
Triglycerides: 361 mg/dL — ABNORMAL HIGH (ref <150)
VLDL: 72 mg/dL — ABNORMAL HIGH (ref 0–40)

## 2015-06-25 LAB — BASIC METABOLIC PANEL
Anion gap: 10 (ref 5–15)
BUN: 20 mg/dL (ref 6–20)
CO2: 27 mmol/L (ref 22–32)
CREATININE: 0.94 mg/dL (ref 0.44–1.00)
Calcium: 9.2 mg/dL (ref 8.9–10.3)
Chloride: 101 mmol/L (ref 101–111)
GLUCOSE: 152 mg/dL — AB (ref 65–99)
POTASSIUM: 4.5 mmol/L (ref 3.5–5.1)
Sodium: 138 mmol/L (ref 135–145)

## 2015-06-25 LAB — PROTIME-INR
INR: 1.15 (ref 0.00–1.49)
Prothrombin Time: 14.9 seconds (ref 11.6–15.2)

## 2015-06-25 LAB — APTT: aPTT: 45 seconds — ABNORMAL HIGH (ref 24–37)

## 2015-06-25 LAB — CBC
HEMATOCRIT: 51.9 % — AB (ref 36.0–46.0)
HEMOGLOBIN: 16.1 g/dL — AB (ref 12.0–15.0)
MCH: 25.4 pg — AB (ref 26.0–34.0)
MCHC: 31 g/dL (ref 30.0–36.0)
MCV: 81.9 fL (ref 78.0–100.0)
Platelets: 270 10*3/uL (ref 150–400)
RBC: 6.34 MIL/uL — AB (ref 3.87–5.11)
RDW: 18.3 % — AB (ref 11.5–15.5)
WBC: 17.3 10*3/uL — AB (ref 4.0–10.5)

## 2015-06-25 LAB — TSH: TSH: 2.113 u[IU]/mL (ref 0.350–4.500)

## 2015-06-25 IMAGING — DX DG CHEST 2V
2 series · 2 of 2 positions shown · non-contrast
Comparison: PA and lateral chest of [DATE]

CLINICAL DATA: History of hypertension, paroxysms mole atrial
fibrillation, coronary artery disease; discontinued smoking 3 years
ago; chest pain over the past 3 6 months

EXAM:
CHEST  2 VIEW

[chest pa]
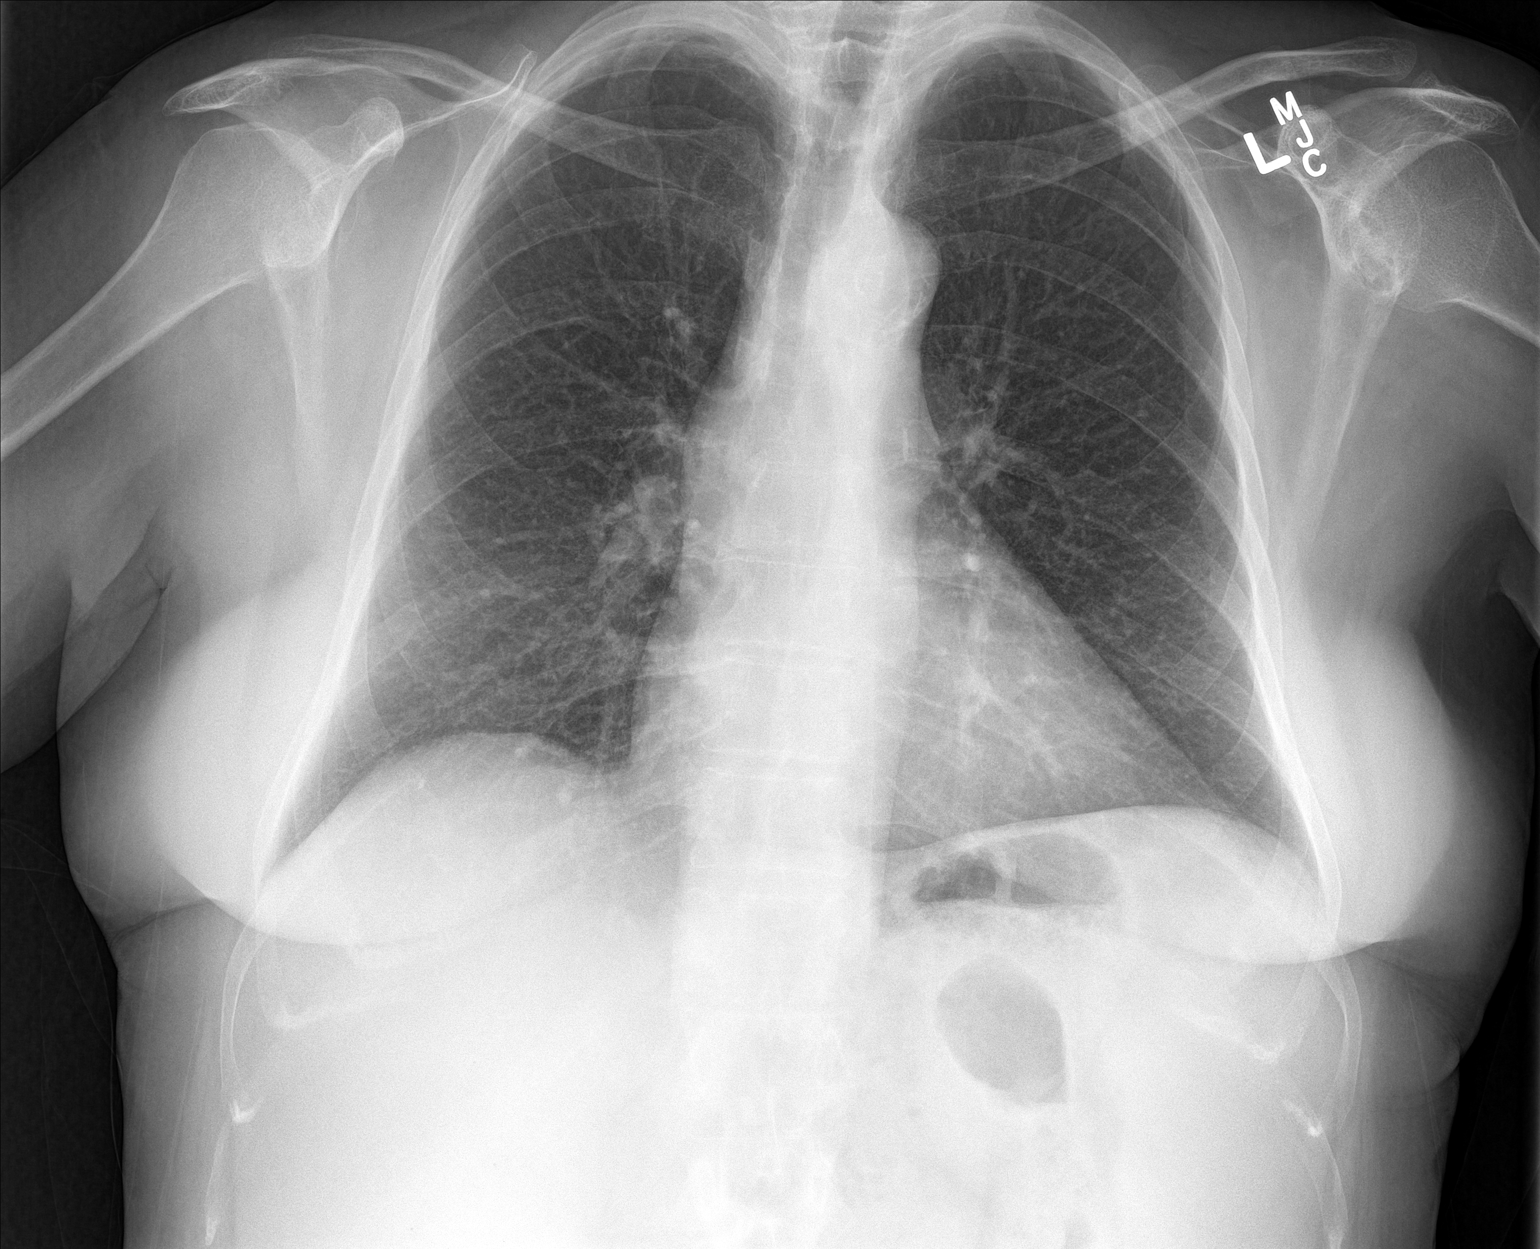

[chest lat]
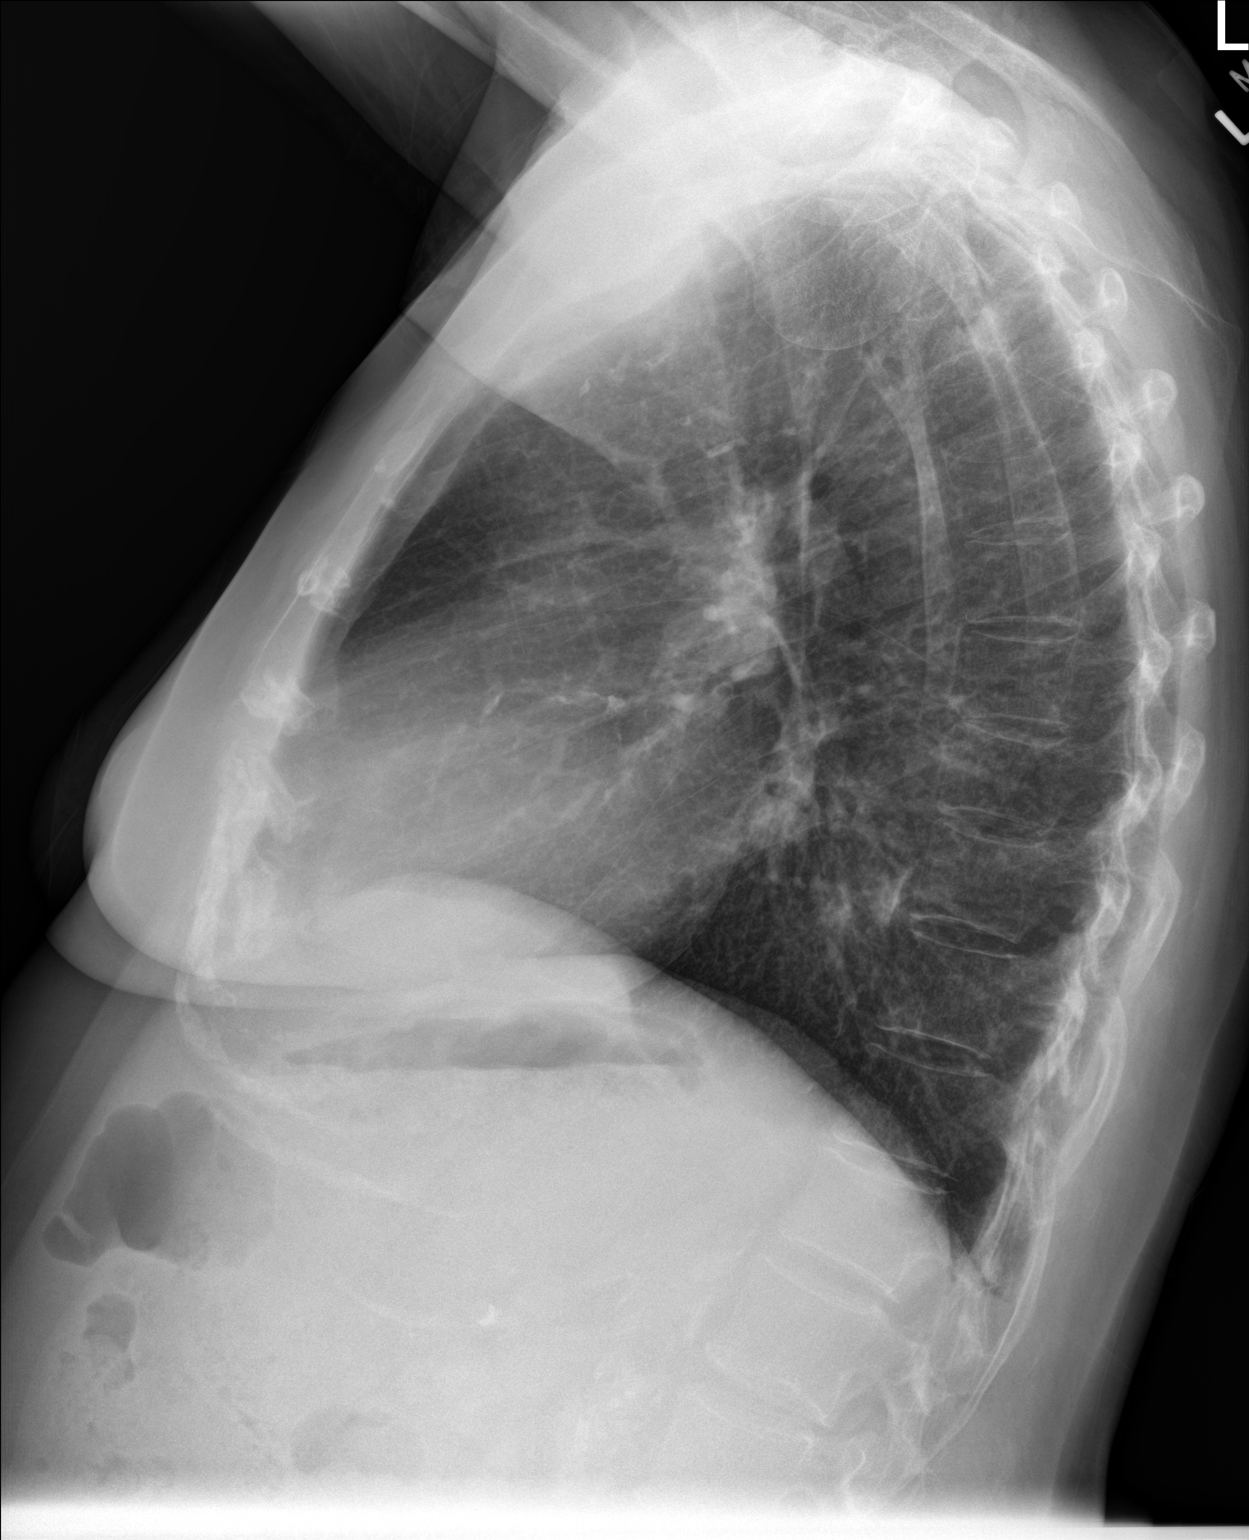

[2 of 2 positions shown; findings below may reference images not displayed]

FINDINGS: The lungs are adequately inflated and clear. The heart and pulmonary
vascularity are normal. The mediastinum is normal in width. There is
no pleural effusion. There are mild degenerative changes of the left
shoulder. The thoracic vertebral bodies are preserved in height.
IMPRESSION: There is no active cardiopulmonary disease.

## 2015-06-27 ENCOUNTER — Encounter (HOSPITAL_COMMUNITY): Payer: Self-pay | Admitting: General Practice

## 2015-06-27 ENCOUNTER — Encounter (HOSPITAL_COMMUNITY)
Admission: RE | Disposition: A | Discharge status: Home / Self Care | Payer: Self-pay | Source: Ambulatory Visit | Attending: Cardiovascular Disease

## 2015-06-27 ENCOUNTER — Ambulatory Visit (HOSPITAL_COMMUNITY)
Admission: RE | Admit: 2015-06-27 | Discharge: 2015-06-28 | Disposition: A | Discharge status: Home / Self Care | Payer: Medicare Other | Source: Ambulatory Visit | Attending: Cardiovascular Disease | Admitting: Cardiovascular Disease

## 2015-06-27 DIAGNOSIS — R1031 Right lower quadrant pain: Secondary | ICD-10-CM

## 2015-06-27 DIAGNOSIS — Z87891 Personal history of nicotine dependence: Secondary | ICD-10-CM | POA: Diagnosis not present

## 2015-06-27 DIAGNOSIS — I10 Essential (primary) hypertension: Secondary | ICD-10-CM | POA: Diagnosis not present

## 2015-06-27 DIAGNOSIS — I70211 Atherosclerosis of native arteries of extremities with intermittent claudication, right leg: Secondary | ICD-10-CM | POA: Insufficient documentation

## 2015-06-27 DIAGNOSIS — I2510 Atherosclerotic heart disease of native coronary artery without angina pectoris: Secondary | ICD-10-CM | POA: Diagnosis present

## 2015-06-27 DIAGNOSIS — E785 Hyperlipidemia, unspecified: Secondary | ICD-10-CM | POA: Diagnosis not present

## 2015-06-27 DIAGNOSIS — R1032 Left lower quadrant pain: Secondary | ICD-10-CM

## 2015-06-27 DIAGNOSIS — I739 Peripheral vascular disease, unspecified: Secondary | ICD-10-CM | POA: Diagnosis present

## 2015-06-27 DIAGNOSIS — E782 Mixed hyperlipidemia: Secondary | ICD-10-CM | POA: Diagnosis present

## 2015-06-27 DIAGNOSIS — I480 Paroxysmal atrial fibrillation: Secondary | ICD-10-CM | POA: Diagnosis not present

## 2015-06-27 DIAGNOSIS — Z959 Presence of cardiac and vascular implant and graft, unspecified: Secondary | ICD-10-CM

## 2015-06-27 DIAGNOSIS — Z7901 Long term (current) use of anticoagulants: Secondary | ICD-10-CM | POA: Diagnosis not present

## 2015-06-27 HISTORY — DX: Fatty (change of) liver, not elsewhere classified: K76.0

## 2015-06-27 HISTORY — PX: OTHER SURGICAL HISTORY: SHX169

## 2015-06-27 HISTORY — PX: PERIPHERAL VASCULAR CATHETERIZATION: SHX172C

## 2015-06-27 LAB — CBC WITH DIFFERENTIAL/PLATELET
BASOS ABS: 0.2 10*3/uL — AB (ref 0.0–0.1)
BASOS PCT: 1 % (ref 0–1)
EOS ABS: 0.6 10*3/uL (ref 0.0–0.7)
Eosinophils Relative: 4 % (ref 0–5)
HEMATOCRIT: 50.5 % — AB (ref 36.0–46.0)
HEMOGLOBIN: 16 g/dL — AB (ref 12.0–15.0)
LYMPHS ABS: 1.1 10*3/uL (ref 0.7–4.0)
LYMPHS PCT: 7 % — AB (ref 12–46)
MCH: 25.6 pg — AB (ref 26.0–34.0)
MCHC: 31.7 g/dL (ref 30.0–36.0)
MCV: 80.8 fL (ref 78.0–100.0)
Monocytes Absolute: 0.6 10*3/uL (ref 0.1–1.0)
Monocytes Relative: 4 % (ref 3–12)
Neutro Abs: 13.5 10*3/uL — ABNORMAL HIGH (ref 1.7–7.7)
Neutrophils Relative %: 84 % — ABNORMAL HIGH (ref 43–77)
PLATELETS: 256 10*3/uL (ref 150–400)
RBC: 6.25 MIL/uL — ABNORMAL HIGH (ref 3.87–5.11)
RDW: 18.5 % — ABNORMAL HIGH (ref 11.5–15.5)
WBC: 16 10*3/uL — AB (ref 4.0–10.5)

## 2015-06-27 LAB — POCT ACTIVATED CLOTTING TIME
ACTIVATED CLOTTING TIME: 208 s
ACTIVATED CLOTTING TIME: 245 s
ACTIVATED CLOTTING TIME: 134 s
Activated Clotting Time: 202 seconds

## 2015-06-27 SURGERY — LOWER EXTREMITY ANGIOGRAPHY
Anesthesia: LOCAL

## 2015-06-27 MED ORDER — HEPARIN (PORCINE) IN NACL 2-0.9 UNIT/ML-% IJ SOLN
INTRAMUSCULAR | Status: AC
  Filled 2015-06-27: qty 500

## 2015-06-27 MED ORDER — HEPARIN (PORCINE) IN NACL 2-0.9 UNIT/ML-% IJ SOLN
INTRAMUSCULAR | Status: AC
  Filled 2015-06-27: qty 1000

## 2015-06-27 MED ORDER — PANTOPRAZOLE SODIUM 40 MG PO TBEC
40.0000 mg | DELAYED_RELEASE_TABLET | Freq: Every day | ORAL | Status: DC
  Administered 2015-06-28: 40 mg via ORAL
  Filled 2015-06-27: qty 1

## 2015-06-27 MED ORDER — ACETAMINOPHEN 500 MG PO TABS: 500.0000 mg | ORAL_TABLET | Freq: Four times a day (QID) | ORAL | Status: DC | PRN

## 2015-06-27 MED ORDER — FLUTICASONE PROPIONATE 50 MCG/ACT NA SUSP: 2.0000 | Freq: Every day | NASAL | Status: DC | PRN

## 2015-06-27 MED ORDER — PRAVASTATIN SODIUM 40 MG PO TABS
40.0000 mg | ORAL_TABLET | Freq: Every evening | ORAL | Status: DC
  Administered 2015-06-27: 18:00:00 40 mg via ORAL
  Filled 2015-06-27: qty 1

## 2015-06-27 MED ORDER — POLYETHYLENE GLYCOL 3350 17 G PO PACK: 17.0000 g | PACK | Freq: Every day | ORAL | Status: DC | PRN

## 2015-06-27 MED ORDER — MIDAZOLAM HCL 2 MG/2ML IJ SOLN
INTRAMUSCULAR | Status: DC | PRN
  Administered 2015-06-27: 1 mg via INTRAVENOUS

## 2015-06-27 MED ORDER — FLECAINIDE ACETATE 50 MG PO TABS
50.0000 mg | ORAL_TABLET | Freq: Two times a day (BID) | ORAL | Status: DC
  Administered 2015-06-27 – 2015-06-28 (×2): 50 mg via ORAL
  Filled 2015-06-27 (×3): qty 1

## 2015-06-27 MED ORDER — EZETIMIBE 10 MG PO TABS
10.0000 mg | ORAL_TABLET | Freq: Every day | ORAL | Status: DC
  Administered 2015-06-27: 20:00:00 10 mg via ORAL
  Filled 2015-06-27: qty 1

## 2015-06-27 MED ORDER — DILTIAZEM HCL ER COATED BEADS 180 MG PO CP24
180.0000 mg | ORAL_CAPSULE | Freq: Every morning | ORAL | Status: DC
  Administered 2015-06-28: 180 mg via ORAL
  Filled 2015-06-27: qty 1

## 2015-06-27 MED ORDER — HYDRALAZINE HCL 20 MG/ML IJ SOLN
10.0000 mg | Freq: Once | INTRAMUSCULAR | Status: AC | PRN
  Administered 2015-06-27: 10 mg via INTRAVENOUS
  Filled 2015-06-27: qty 1

## 2015-06-27 MED ORDER — DOCUSATE SODIUM 100 MG PO CAPS
200.0000 mg | ORAL_CAPSULE | Freq: Every day | ORAL | Status: DC
  Administered 2015-06-27 – 2015-06-28 (×2): 200 mg via ORAL
  Filled 2015-06-27 (×2): qty 2

## 2015-06-27 MED ORDER — SODIUM CHLORIDE 0.9 % WEIGHT BASED INFUSION: 1.0000 mL/kg/h | INTRAVENOUS | Status: DC

## 2015-06-27 MED ORDER — CLOPIDOGREL BISULFATE 300 MG PO TABS
ORAL_TABLET | ORAL | Status: AC
  Filled 2015-06-27: qty 1

## 2015-06-27 MED ORDER — MECLIZINE HCL 25 MG PO TABS: 25.0000 mg | ORAL_TABLET | Freq: Every day | ORAL | Status: DC | PRN

## 2015-06-27 MED ORDER — ASPIRIN EC 325 MG PO TBEC: 325.0000 mg | DELAYED_RELEASE_TABLET | Freq: Every day | ORAL | Status: DC

## 2015-06-27 MED ORDER — ASPIRIN 81 MG PO CHEW
81.0000 mg | CHEWABLE_TABLET | ORAL | Status: AC
  Administered 2015-06-27: 81 mg via ORAL

## 2015-06-27 MED ORDER — HEPARIN SODIUM (PORCINE) 1000 UNIT/ML IJ SOLN
INTRAMUSCULAR | Status: DC | PRN
  Administered 2015-06-27: 3000 [IU] via INTRAVENOUS
  Administered 2015-06-27: 5000 [IU] via INTRAVENOUS

## 2015-06-27 MED ORDER — TRAMADOL HCL 50 MG PO TABS
50.0000 mg | ORAL_TABLET | Freq: Three times a day (TID) | ORAL | Status: DC | PRN
  Administered 2015-06-27 – 2015-06-28 (×3): 50 mg via ORAL
  Filled 2015-06-27 (×3): qty 1

## 2015-06-27 MED ORDER — HEPARIN (PORCINE) IN NACL 2-0.9 UNIT/ML-% IJ SOLN
INTRAMUSCULAR | Status: DC | PRN
  Administered 2015-06-27: 09:00:00

## 2015-06-27 MED ORDER — ONDANSETRON HCL 4 MG/2ML IJ SOLN
4.0000 mg | Freq: Four times a day (QID) | INTRAMUSCULAR | Status: DC | PRN
  Administered 2015-06-27: 4 mg via INTRAVENOUS
  Filled 2015-06-27: qty 2

## 2015-06-27 MED ORDER — FENTANYL CITRATE (PF) 100 MCG/2ML IJ SOLN
INTRAMUSCULAR | Status: AC
  Filled 2015-06-27: qty 2

## 2015-06-27 MED ORDER — MIDAZOLAM HCL 2 MG/2ML IJ SOLN
INTRAMUSCULAR | Status: AC
  Filled 2015-06-27: qty 2

## 2015-06-27 MED ORDER — SODIUM CHLORIDE 0.9 % IJ SOLN: 3.0000 mL | INTRAMUSCULAR | Status: DC | PRN

## 2015-06-27 MED ORDER — ACETAMINOPHEN 325 MG PO TABS
650.0000 mg | ORAL_TABLET | ORAL | Status: DC | PRN
  Administered 2015-06-28: 650 mg via ORAL
  Filled 2015-06-27: qty 2

## 2015-06-27 MED ORDER — LIDOCAINE HCL (PF) 1 % IJ SOLN
INTRAMUSCULAR | Status: AC
  Filled 2015-06-27: qty 30

## 2015-06-27 MED ORDER — SODIUM CHLORIDE 0.9 % WEIGHT BASED INFUSION
3.0000 mL/kg/h | INTRAVENOUS | Status: DC
  Administered 2015-06-27: 3 mL/kg/h via INTRAVENOUS

## 2015-06-27 MED ORDER — HEPARIN SODIUM (PORCINE) 1000 UNIT/ML IJ SOLN
INTRAMUSCULAR | Status: AC
  Filled 2015-06-27: qty 1

## 2015-06-27 MED ORDER — ASPIRIN 81 MG PO CHEW
CHEWABLE_TABLET | ORAL | Status: AC
  Administered 2015-06-27: 81 mg via ORAL
  Filled 2015-06-27: qty 1

## 2015-06-27 MED ORDER — FENTANYL CITRATE (PF) 100 MCG/2ML IJ SOLN
INTRAMUSCULAR | Status: DC | PRN
  Administered 2015-06-27: 25 ug via INTRAVENOUS
  Administered 2015-06-27: 50 ug via INTRAVENOUS

## 2015-06-27 MED ORDER — IBUPROFEN 200 MG PO TABS
200.0000 mg | ORAL_TABLET | Freq: Four times a day (QID) | ORAL | Status: DC | PRN
  Filled 2015-06-27: qty 1

## 2015-06-27 MED ORDER — SODIUM CHLORIDE 0.9 % IV SOLN: INTRAVENOUS | Status: AC

## 2015-06-27 SURGICAL SUPPLY — 29 items
BALLOON SABER 4.0X200X150 (BALLOONS) ×1 IMPLANT
BALLOON SABER 6.0X200X150 (BALLOONS) ×1 IMPLANT
CATH ANGIO 5F PIGTAIL 65CM (CATHETERS) ×1 IMPLANT
CATH CROSS OVER TEMPO 5F (CATHETERS) ×1 IMPLANT
CATH STRAIGHT 5FR 65CM (CATHETERS) ×1 IMPLANT
CATH VIANCE CROSS STAND 150CM (MICROCATHETER) ×4
CATH VIANCE CROSS STD 150CM (MICROCATHETER) IMPLANT
DEVICE CONTINUOUS FLUSH (MISCELLANEOUS) ×1 IMPLANT
GUIDEWIRE ANGLED .035X150CM (WIRE) ×1 IMPLANT
GUIDEWIRE ASTATO XS 20G 300CM (WIRE) ×1 IMPLANT
HAND CONTROLLER AVANTA (MISCELLANEOUS) ×1 IMPLANT
KIT ENCORE 26 ADVANTAGE (KITS) ×1 IMPLANT
KIT PV (KITS) ×4 IMPLANT
SET AVANTA MULTI PATIENT (MISCELLANEOUS) IMPLANT
SET AVANTA SINGLE PATIENT (MISCELLANEOUS) IMPLANT
SHEATH AVANTA HAND CONTROLLER (MISCELLANEOUS) ×1 IMPLANT
SHEATH HIGHFLEX ANSEL 7FR 55CM (SHEATH) ×1 IMPLANT
SHEATH PINNACLE 5F 10CM (SHEATH) ×1 IMPLANT
SHEATH PINNACLE 7F 10CM (SHEATH) ×1 IMPLANT
STENT VIABAHN 6X100X120 (Permanent Stent) ×1 IMPLANT
STENT VIABAHN 6X250X120 (Permanent Stent) ×1 IMPLANT
SYR MEDRAD MARK V 150ML (SYRINGE) IMPLANT
TAPE RADIOPAQUE TURBO (MISCELLANEOUS) ×1 IMPLANT
TRANSDUCER W/STOPCOCK (MISCELLANEOUS) ×4 IMPLANT
TRAY PV CATH (CUSTOM PROCEDURE TRAY) ×4 IMPLANT
TUBING CIL FLEX 10 FLL-RA (TUBING) ×1 IMPLANT
WIRE HITORQ VERSACORE ST 145CM (WIRE) ×1 IMPLANT
WIRE ROSEN-J .035X180CM (WIRE) ×1 IMPLANT
WIRE SPARTACORE .014X300CM (WIRE) ×1 IMPLANT

## 2015-06-27 NOTE — Interval H&P Note (Signed)
History and Physical Interval Note:  06/27/2015 7:29 AM  Kyliah B Frey  has presented today for surgery, with the diagnosis of PVD  The various methods of treatment have been discussed with the patient and family. After consideration of risks, benefits and other options for treatment, the patient has consented to  Procedure(s): Lower Extremity Angiography (N/A) as a surgical intervention .  The patient's history has been reviewed, patient examined, no change in status, stable for surgery.  I have reviewed the patient's chart and labs.  Questions were answered to the patient's satisfaction.     Quay Burow

## 2015-06-27 NOTE — H&P (View-Only) (Signed)
06/18/2015 [name]   [birth year]  [id number]  Primary Physician Octavio Graves, DO Primary Cardiologist: Lorretta Harp MD [name]   HPI:   The patient is a 64 year old, thin-appearing, widowed Caucasian female, mother of 2 children, grandmother to 5 grandchildren and 3 step-grandchildren who she has raised. I saw her 10 months ago. She has a history of PVOD status post left external iliac artery PTA and stenting as well as right SFA stenting by myself August 2007 with a known occluded left SFA. Her other problems include recently discontinued tobacco abuse November 11, 2012, hypertension and hyperlipidemia. She is statin intolerant. She has had PAF in the past and has been on Coumadin anticoagulation which was stopped because of nosebleeds and changed to Eliquis . She really denies chest pain, shortness of breath, or claudication. Her last Myoview performed December 27, 2009, was nonischemic. Dopplers performed a year ago showed patent stents with right ABI of 0.87 and a left of 0.65 .She had cardiac catheterization performed by Dr. Ellyn Hack revealed noncritical CAD.since I saw her back in September of last year she developed recurrent right calf claudication beginning in November or December of last year. Most recent Doppler show  A decline in her ABI from 0.1-0.5 with an occluded right SFA stent suggesting in-stent restenosis.   Current Outpatient Prescriptions  Medication Sig Dispense Refill  . acetaminophen (TYLENOL) 500 MG tablet Take 500 mg by mouth every 6 (six) hours as needed for mild pain.     Marland Kitchen BESIVANCE 0.6 % SUSP Place 1 drop into both eyes as directed. Before procedure.    . diltiazem (CARDIZEM CD) 180 MG 24 hr capsule Take 180 mg by mouth every morning.    . docusate sodium (COLACE) 100 MG capsule Take 200 mg by mouth daily.     . DUREZOL 0.05 % EMUL Place 1 drop into both eyes as directed. Before procedure.    Marland Kitchen ELIQUIS 5 MG TABS tablet TAKE ONE TABLET BY  MOUTH TWICE A DAY AS DIRECTED 60 tablet 5  . fenofibrate (TRICOR) 145 MG tablet TAKE ONE TABLET BY MOUTH AT BEDTIME 30 tablet 5  . flecainide (TAMBOCOR) 50 MG tablet TAKE ONE TABLET BY MOUTH TWICE DAILY 60 tablet 5  . fluticasone (FLONASE) 50 MCG/ACT nasal spray Place 2 sprays into both nostrils daily as needed for allergies.     . hydrochlorothiazide (HYDRODIURIL) 25 MG tablet Take 25 mg by mouth daily.     Marland Kitchen ibuprofen (ADVIL,MOTRIN) 200 MG tablet Take 200 mg by mouth every 6 (six) hours as needed.    . meclizine (ANTIVERT) 25 MG tablet Take 25 mg by mouth daily as needed for dizziness or nausea.     Marland Kitchen omeprazole (PRILOSEC) 20 MG capsule Take 20 mg by mouth 2 (two) times daily.    Vladimir Faster Glycol-Propyl Glycol (SYSTANE OP) Place 1 drop into both eyes 4 (four) times daily as needed (dryness).    . polyethylene glycol (MIRALAX / GLYCOLAX) packet Take 17 g by mouth daily as needed (constipation).    . pravastatin (PRAVACHOL) 40 MG tablet Take 1 tablet (40 mg total) by mouth every evening. 30 tablet 8  . PROLENSA 0.07 % SOLN Place 1 drop into both eyes as directed. Before procedure.    . traMADol (ULTRAM) 50 MG tablet Take 50 mg by mouth 3 (three) times daily as needed.     Marland Kitchen ZETIA 10 MG tablet TAKE ONE TABLET BY MOUTH AT BEDTIME 30 tablet  5   No current facility-administered medications for this visit.    Allergies  Allergen Reactions  . Crestor [Rosuvastatin] Other (See Comments)    Itchy rash  . Demerol [Meperidine] Other (See Comments)    Unknown, can't remember   . Effexor [Venlafaxine Hcl]     Doesn't remember   . Inderal [Propranolol]     Doesn't remember   . Lovaza [Omega-3-Acid Ethyl Esters]     Doesn't remember   . Penicillins Other (See Comments)    Questionable high fever when taken and broke out in whelps.   Richardo Hanks [Oxycodone-Acetaminophen]     Doesn't remember   . Pravastatin Itching  . Warfarin And Related Other (See Comments)    Caused nose bleeds  . Atenolol  Rash  . Lipitor [Atorvastatin] Rash  . Nexium [Esomeprazole Magnesium] Rash  . Plavix [Clopidogrel Bisulfate] Rash  . Simvastatin Itching and Rash    Also lipitor intolerant  . Trilipix [Choline Fenofibrate] Other (See Comments)    Doesn't remember     History   Social History  . Marital Status: Widowed    Spouse Name: N/A  . Number of Children: 2  . Years of Education: 12   Occupational History  . CNA    Social History Main Topics  . Smoking status: Former Smoker -- 1.00 packs/day for 45 years    Types: Cigarettes    Quit date: 11/11/2012  . Smokeless tobacco: Never Used  . Alcohol Use: No  . Drug Use: No  . Sexual Activity: No   Other Topics Concern  . Not on file   Social History Narrative     Review of Systems: General: negative for chills, fever, night sweats or weight changes.  Cardiovascular: negative for chest pain, dyspnea on exertion, edema, orthopnea, palpitations, paroxysmal nocturnal dyspnea or shortness of breath Dermatological: negative for rash Respiratory: negative for cough or wheezing Urologic: negative for hematuria Abdominal: negative for nausea, vomiting, diarrhea, bright red blood per rectum, melena, or hematemesis Neurologic: negative for visual changes, syncope, or dizziness All other systems reviewed and are otherwise negative except as noted above.    Blood pressure 176/92, pulse 74, height 5' 5.5" (1.664 m), weight 150 lb (68.04 kg).  General appearance: alert and no distress Neck: no adenopathy, no JVD, supple, symmetrical, trachea midline, thyroid not enlarged, symmetric, no tenderness/mass/nodules and ssoft bilateral carotid bruits Lungs: clear to auscultation bilaterally Heart: regular rate and rhythm, S1, S2 normal, no murmur, click, rub or gallop Extremities: extremities normal, atraumatic, no cyanosis or edema  EKG normal sinus rhythm at 70 without ST or T-wave changes. I personally reviewed this EKG  ASSESSMENT AND PLAN:    PAF (paroxysmal atrial fibrillation), maintaing SR History of PAF on flecainide and Eliquis   PAD (peripheral artery disease), Lt carotid 50-70%: , Hx stent Lt exteral iliac & RSFA stent, known occl. LSFA  History of PAD with remote left iliac stenting and known left SFA occlusion with remote right SFA intervention and re-intervention for in-stent restenosis in 2008. She has 3 overlapping nitinol self-expanding stents in her right SFA. Her right ABI has decreased over the last year from 0.81 on 05/10/14.50 on 06/03/15 with an occluded right SFA suggesting "in-stent restenosis". This is going on since the end of last year. She was symptomatic. We will arrange for angiography and intervention most likely requiring BiPAP on coverage stenting. She will need to stop her oral AC 2 days prior to the procedure.  Hyperlipidemia History of hyperlipidemia  on Zetia and pravastatin. We'll recheck a lipid and liver profile  Carotid artery disease History of moderate left ICA stenosis by duplex ultrasound ultrasound 02/06/15. She does complain of some left-sided TIA type symptoms. We will recheck carotid Doppler studies  CAD (coronary artery disease), per cath 04/27/13, non obstructive disease 20-30% LM; LAD 30%; 50-60% OM2; RCA 40%; EF 65-70% History of coronary artery disease with catheterization performed by Dr. Ellyn Hack that showed noncritical CAD. She denies chest pain or shortness of breath.      Lorretta Harp MD FACP,FACC,FAHA, [facility] 06/18/2015 2:43 PM

## 2015-06-27 NOTE — Progress Notes (Signed)
Site area: left groin  Site Prior to Removal:  Level 0  Pressure Applied For 20 MINUTES    Minutes Beginning at 1310  Manual:   Yes.    Patient Status During Pull:  stable  Post Pull Groin Site:  Level 0  Post Pull Instructions Given:  Yes.    Post Pull Pulses Present:  Yes.    Dressing Applied:  Yes.    Comments:

## 2015-06-28 ENCOUNTER — Other Ambulatory Visit: Payer: Self-pay | Admitting: Cardiology

## 2015-06-28 ENCOUNTER — Encounter (HOSPITAL_COMMUNITY): Payer: Self-pay

## 2015-06-28 ENCOUNTER — Telehealth: Payer: Self-pay | Admitting: *Deleted

## 2015-06-28 DIAGNOSIS — D72829 Elevated white blood cell count, unspecified: Secondary | ICD-10-CM

## 2015-06-28 DIAGNOSIS — I739 Peripheral vascular disease, unspecified: Secondary | ICD-10-CM

## 2015-06-28 DIAGNOSIS — Z959 Presence of cardiac and vascular implant and graft, unspecified: Secondary | ICD-10-CM

## 2015-06-28 DIAGNOSIS — I70211 Atherosclerosis of native arteries of extremities with intermittent claudication, right leg: Secondary | ICD-10-CM | POA: Diagnosis not present

## 2015-06-28 LAB — CBC
HEMATOCRIT: 46.4 % — AB (ref 36.0–46.0)
HEMOGLOBIN: 14.6 g/dL (ref 12.0–15.0)
MCH: 25.3 pg — ABNORMAL LOW (ref 26.0–34.0)
MCHC: 31.5 g/dL (ref 30.0–36.0)
MCV: 80.6 fL (ref 78.0–100.0)
Platelets: 255 10*3/uL (ref 150–400)
RBC: 5.76 MIL/uL — AB (ref 3.87–5.11)
RDW: 18.7 % — ABNORMAL HIGH (ref 11.5–15.5)
WBC: 17.4 10*3/uL — ABNORMAL HIGH (ref 4.0–10.5)

## 2015-06-28 LAB — BASIC METABOLIC PANEL
Anion gap: 9 (ref 5–15)
BUN: 13 mg/dL (ref 6–20)
CALCIUM: 8.7 mg/dL — AB (ref 8.9–10.3)
CHLORIDE: 102 mmol/L (ref 101–111)
CO2: 25 mmol/L (ref 22–32)
Creatinine, Ser: 0.91 mg/dL (ref 0.44–1.00)
GFR calc Af Amer: >60 mL/min (ref >60)
GLUCOSE: 112 mg/dL — AB (ref 65–99)
Potassium: 4.1 mmol/L (ref 3.5–5.1)
Sodium: 136 mmol/L (ref 135–145)

## 2015-06-28 MED ORDER — ASPIRIN 81 MG PO TBEC: 81.0000 mg | DELAYED_RELEASE_TABLET | Freq: Every day | ORAL | Status: DC

## 2015-06-28 MED ORDER — APIXABAN 5 MG PO TABS
5.0000 mg | ORAL_TABLET | Freq: Two times a day (BID) | ORAL | Status: DC
  Administered 2015-06-28: 5 mg via ORAL
  Filled 2015-06-28 (×2): qty 1

## 2015-06-28 MED ORDER — ASPIRIN EC 81 MG PO TBEC
81.0000 mg | DELAYED_RELEASE_TABLET | Freq: Every day | ORAL | Status: DC
  Administered 2015-06-28: 10:00:00 81 mg via ORAL
  Filled 2015-06-28: qty 1

## 2015-06-28 NOTE — Telephone Encounter (Signed)
-----   Message from Isaiah Serge, NP sent at 06/28/2015  2:32 PM EDT ----- Just wanted to make sure you rec'd the message, pt needs appt with Dr. Beryle Beams for leukocytosis and he is only seeing pt's at the hospital office. Could you have somebody arrange, if we leave a message then when they call back we may be off or know one else will know what is up.  Thanks.  That number is [phone].  Sorry we did try.

## 2015-06-28 NOTE — Discharge Instructions (Signed)
Call [place] at [phone] if any bleeding, swelling or drainage at cath site.  May shower, no tub baths for 48 hours for groin sticks. No lifting over 5 pounds for 3 days.  No Driving for 3 days.    You may expect rt thigh pain for up to 2 weeks due to the work Dr. Adora Fridge did in the rt artery.  If your Left groin does not improve or gets worse, call Dr. Eugene Garnet office we may do ultrasound then.   Out pt dopplers of rt leg to eval the new stent. Do not eat after 8:00 AM the day of the study and only a light breakfast before 8:00AM the day of the study   Heart Healthy diet.     We are arranging appointment with Dr. Beryle Beams to evaluate you White blood cell count.

## 2015-06-28 NOTE — Discharge Summary (Signed)
Physician Discharge Summary       Patient ID: [name] MRN: [id number] DOB/AGE: July 20, 1951 64 y.o.  Admit date: 06/27/2015 Discharge date: 06/28/2015 Primary Cardiologist:  Dr. [name] Discharge Diagnoses:  Principal Problem:   Claudication Active Problems:   S/P arterial stent right SFA overlapping Bahn covered stents 06/27/2015   PAD (peripheral artery disease), Lt carotid 50-70%: , Hx stent Lt exteral iliac & RSFA stent, known occl. LSFA    Hyperlipidemia   PAF (paroxysmal atrial fibrillation), maintaing SR   Chronic anticoagulation, new- eliquis   CAD (coronary artery disease), per cath 04/27/13, non obstructive disease 20-30% LM; LAD 30%; 50-60% OM2; RCA 40%; EF 65-70%   Discharged Condition: good  Procedures: 06/27/2015 PT angiogram and PTA and stenting of a chronic total occlusion secondary to in-stent restenosis within a previously placed right SFA stent.  Hospital Course: 64 year old female patient has a history of PVD s/p left internal iliac artery PTA and stent in 07/2006 with known occluded left SFA. She has hypertension and hyperlipidemia. She has a history of PAF with bleeding on coumadin and now on Elequis. She developed recurrent right calf claudication beginning in November or December 2015. Her doppler showed a decline in ABI from 0.1-.05 with an occluded right SFA stent suggesting in-stent restenosis.   She presented on 06/27/2015 for angiography and potential intervention. A successful PTA and stenting of a chronic total occlusion secondary to in-stent restenosis within a previously placed right SFA. Expanding stent using overlapping via Bahn covered stents  [name] tolerated her procedure well. She was hydrated overnight and vital signs and cardiac rhythm have been stable. Her left groin cath site is tender, but soft, without active bleeding. Her right thigh is sore with walking, but this is to be expected in relation to the extent of her intervention and may last for  about 2 weeks. Her right leg and foot are pink and warm with palpable pedal pulse and intact sensation. She has been up ambulating independently in the room and with cardiac rehab in the hall.  The patient is allergic to Plavix and will continue Eliquis and aspirin.  Dr. Angelena Form has examined the patient and deemed her stable for discharge. She will have follow up dopplers in Dr. Adora Fridge 's office and then follow up with Dr. Adora Fridge.  She has leukocytosis and Dr. Adora Fridge has discussed with Dr. Beryle Beams and will send pt for consult.  Possible CLL.  The office will arrange.     Significant Diagnostic Studies:  06/27/2015  Angiographic Data:   1: Abdominal aortogram-moderately atherosclerotic distal abdominal aorta  2: Left lower extremity-patent left common iliac artery stent, occluded left SFA (old finding) with reconstitution of the distal SFA in the adductor canal by profunda femoris collaterals. There was three-vessel runoff  3: Right lower extremity-50% proximal right external iliac artery artery stenosis. 70% segmental proximal right SFA stenosis including in-stent restenosis, occluded mid and distal right SFA secondary to "in-stent restenosis with reconstitution in the adductor canal by profunda femoris collaterals. There was three-vessel runoff  successful PTCA and stenting of a chronic total occlusion secondary to "in-stent restenosis within a previously placed right SFA. Expanding stent using overlapping via Bahn covered stents. Careful attention was given to precise placement of the proximal edge of the viable on as to not compromise the profunda. The patient has a Plavix allergy  Discharge Exam: Blood pressure 168/61, pulse 78, temperature 98.1 F (36.7 C), temperature source Oral, resp. rate 18,  height 5' 5.5" (1.664 m), weight 154 lb 12.2 oz (70.2 kg), SpO2 95 %.   Tele: NSR   Disposition: 01-Home or Self Care     Medication List    STOP taking these medications          ibuprofen 200 MG tablet  Commonly known as:  ADVIL,MOTRIN      TAKE these medications        acetaminophen 500 MG tablet  Commonly known as:  TYLENOL  Take 500 mg by mouth every 6 (six) hours as needed for mild pain.     aspirin 81 MG EC tablet  Take 1 tablet (81 mg total) by mouth daily.     diltiazem 180 MG 24 hr capsule  Commonly known as:  CARDIZEM CD  Take 180 mg by mouth every morning.     docusate sodium 100 MG capsule  Commonly known as:  COLACE  Take 200 mg by mouth daily.     ELIQUIS 5 MG Tabs tablet  Generic drug:  apixaban  TAKE ONE TABLET BY MOUTH TWICE A DAY AS DIRECTED     fenofibrate 145 MG tablet  Commonly known as:  TRICOR  TAKE ONE TABLET BY MOUTH AT BEDTIME     flecainide 50 MG tablet  Commonly known as:  TAMBOCOR  TAKE ONE TABLET BY MOUTH TWICE DAILY     fluticasone 50 MCG/ACT nasal spray  Commonly known as:  FLONASE  Place 2 sprays into both nostrils daily as needed for allergies.     meclizine 25 MG tablet  Commonly known as:  ANTIVERT  Take 25 mg by mouth daily as needed for dizziness or nausea.     omeprazole 20 MG capsule  Commonly known as:  PRILOSEC  Take 20 mg by mouth 2 (two) times daily as needed (acid reflux).     polyethylene glycol packet  Commonly known as:  MIRALAX / GLYCOLAX  Take 17 g by mouth daily as needed (constipation).     pravastatin 40 MG tablet  Commonly known as:  PRAVACHOL  Take 1 tablet (40 mg total) by mouth every evening.     SYSTANE OP  Place 1 drop into both eyes 4 (four) times daily as needed (dryness).     traMADol 50 MG tablet  Commonly known as:  ULTRAM  Take 50 mg by mouth 3 (three) times daily as needed (pain).     ZETIA 10 MG tablet  Generic drug:  ezetimibe  TAKE ONE TABLET BY MOUTH AT BEDTIME       Follow-up Information    Follow up with Quay Burow, MD On 07/11/2015.   Specialties:  Cardiology, Radiology   Why:  at 10:30 am   Contact information:   [address] [place] [address] [phone]       Follow up with Quay Burow, MD On 07/05/2015.   Specialties:  Cardiology, Radiology   Why:  at 1:00 pm for dopplers only   Contact information:   [address] [place] [address] [phone]       Follow up with Annia Belt, MD.   Specialty:  Oncology   Why:  dr. Kennon Holter office will call you for date and time   Contact information:   [place] [phone]        Discharge Instructions:  Call [place] at [phone] if any bleeding, swelling or drainage at cath site. May shower,  no tub baths for 48 hours for groin sticks. No lifting over 5 pounds for 3 days. No Driving for 3 days.   You may expect rt thigh pain for up to 2 weeks due to the work Dr. Adora Fridge did in the rt artery.   If your Left groin does not improve or gets worse, call Dr. Eugene Garnet office we may do ultrasound then.   Out pt dopplers of rt leg to eval the new stent. Do not eat after 8:00 AM the day of the study and only a light breakfast before 8:00AM the day of the study  Heart Healthy diet.   We are arranging appointment with Dr. Beryle Beams to evaluate you White blood cell count.      Signed: [id number],BEEFE O Nurse Practitioner Student, Jarrett Soho. Documentation completed in the presence of and under the direct supervision of Cecilie Kicks, NP.   [facility]: [facility] 06/28/2015, 10:01 AM  Time spent on discharge :  >30 minutes.

## 2015-06-28 NOTE — Progress Notes (Addendum)
Subjective: The patient is a 64 year old female with a history of PVOD status post left external iliac artery PTA and stenting as well as right SFA stenting in August 2007 with a known occluded left SFA. Her other problems include recently discontinued tobacco abuse November 11, 2012, hypertension and hyperlipidemia. She is statin intolerant. She has had PAF in the past and has been on Coumadin anticoagulation which was stopped because of nosebleeds and changed to Eliquis . She really denies chest pain, shortness of breath, or claudication. Her last Myoview performed December 27, 2009, was nonischemic. Dopplers performed a year ago showed patent stents with right ABI of 0.87 and a left of 0.65 .She had cardiac catheterization performed by Dr. Ellyn Hack revealed noncritical CAD. She developed recurrent right calf claudication beginning in November or December of last year. Most recent Doppler showed a decline in her ABI from 0.1-0.5 with an occluded right SFA stent suggesting in-stent restenosis.   She presented on 06/27/2015 for angiography and potential intervention. A successful PTA and stenting of a chronic total occlusion secondary to "in-stent restenosis within a previously placed right SFA. Expanding stent using overlapping via Bahn covered stents. Careful attention was given to precise placement of the proximal edge of the viable on as to not compromise the profunda. The patient has a Plavix allergy. She is on Eliquis which will be restarted tomorrow.  She will be treated with aspirin 81 mg. She will get follow-up lower extremity arterial Doppler studies in our [facility] line office next week I will see me back in the office 2-3 weeks thereafter.  Objective: Vital signs in last 24 hours: Temp:  [97.8 F (36.6 C)-98.4 F (36.9 C)] 97.9 F (36.6 C) (07/08 0412) Pulse Rate:  [0-118] 78 (07/08 0412) Resp:  [0-21] 19 (07/08 0412) BP: (97-178)/(45-85) 153/63 mmHg (07/08 0620) SpO2:  [0 %-98 %]  94 % (07/08 0412) Weight:  [154 lb 12.2 oz (70.2 kg)] 154 lb 12.2 oz (70.2 kg) (07/08 0008) Weight change: 6 lb 9 oz (2.977 kg) Last BM Date: 06/26/15 Intake/Output from previous day: 07/07 0701 - 07/08 0700 In: 1401.3 [P.O.:480; I.V.:921.3] Out: 3300 [Urine:3300] Intake/Output this shift: Total I/O In: 225 [I.V.:225] Out: 1250 [Urine:1250]  PE:   General:Pleasant affect, NAD Skin:Warm and dry, brisk capillary refill HEENT:normocephalic, sclera clear, mucus membranes moist Neck:supple, no JVD, no bruits  Heart:S1S2 RRR 1.5/6 murmur, no gallup, rub or click Lungs:clear without rales, rhonchi, or wheezes NTI:RWER, non tender, + BS, do not palpate liver spleen or masses Ext:no lower ext edema, 2+ radial pulses- Left groin cath site tender, without edema, + hematoma above cath insertion + painful to touch no bruising, Right pedal pulse 1+ and easily palpable. Left pedal pulse faint and difficult to palpate. Bilateral feet warm and pink with good sensation. Rt thigh pain with movement that was not present prior to procedure.  Neuro:alert and oriented, MAE, follows commands, + facial symmetry  Lab Results:  Recent Labs  06/27/15 0636 06/28/15 0340  WBC 16.0* 17.4*  HGB 16.0* 14.6  HCT 50.5* 46.4*  PLT 256 255   BMET  Recent Labs  06/25/15 0955 06/28/15 0340  NA 138 136  K 4.5 4.1  CL 101 102  CO2 27 25  GLUCOSE 152* 112*  BUN 20 13  CREATININE 0.94 0.91  CALCIUM 9.2 8.7*   No results for input(s): TROPONINI in the last 72 hours.  Invalid input(s): CK, MB  Lab Results  Component Value  Date   CHOL 156 06/25/2015   HDL 36* 06/25/2015   LDLCALC 48 06/25/2015   TRIG 361* 06/25/2015   CHOLHDL 4.3 06/25/2015   No results found for: HGBA1C   Lab Results  Component Value Date   TSH 2.113 06/25/2015    Hepatic Function Panel  Recent Labs  06/25/15 0955  PROT 7.8  ALBUMIN 4.2  AST 24  ALT 20  ALKPHOS 74  BILITOT 0.7  BILIDIR 0.1  IBILI 0.6    Recent  Labs  06/25/15 0950  CHOL 156   No results for input(s): PROTIME in the last 72 hours.     Studies/Results:  06/27/2015:  Agiographic Data:   1: Abdominal aortogram-moderately atherosclerotic distal abdominal aorta  2: Left lower extremity-patent left common iliac artery stent, occluded left SFA (old finding) with reconstitution of the distal SFA in the adductor canal by profunda femoris collaterals. There was three-vessel runoff  3: Right lower extremity-50% proximal right external iliac artery artery stenosis. 70% segmental proximal right SFA stenosis including in-stent restenosis, occluded mid and distal right SFA secondary to "in-stent restenosis with reconstitution in the adductor canal by profunda femoris collaterals. There was three-vessel runoff  IMPRESSION:Mrs. Hornung has an occluded right SFA stent with lifestyle limiting claudication. Will proceed with percutaneous revascularization using a Viance CTO catheter and Viabahn overlapping covered stents.  Procedure Description:contralateral access was obtained with a crossover catheter, Glidewire, a Rosen wire and a 7 [place] multipurpose 55 cm long Ansel Sheath. The patient received 8000 units of heparin intravenously with an ACT of 242. A total of 280 mL of contrast was administered during the case. The lesion was crossed with the Viance catheter and [place] core wire. Following this balloon angioplasty was performed with a 4 mm x 200 mm long Saber balloon. Stenting of the entire SFA from just below the occluded stent to the origin was performed with overlapping 6 mm via Bahn covered stents (6 x 250, 6 X 100 mm). The entire stented segment was postdilated with a 6 mm x 200 mm long saber balloon resulting in reduction of a total occlusion to 0% residual with excellent flow. The profunda was spared. Completion angiography was performed. There was some hypodensity in the common femoral artery which initially was thought to potentially be  dissection but on closer inspection on multiple without all views it was determined that this was calcification. The sheath was then withdrawn across the bifurcation and exchanged over an 035 wire for a short 7 [place] sheath. The patient left the lab in stable condition.   Medications:  Prior to Admission:  Prescriptions prior to admission  Medication Sig Dispense Refill Last Dose  . acetaminophen (TYLENOL) 500 MG tablet Take 500 mg by mouth every 6 (six) hours as needed for mild pain.    Past Month at Unknown time  . diltiazem (CARDIZEM CD) 180 MG 24 hr capsule Take 180 mg by mouth every morning.   06/27/2015 at Unknown time  . docusate sodium (COLACE) 100 MG capsule Take 200 mg by mouth daily.    06/26/2015 at Unknown time  . fenofibrate (TRICOR) 145 MG tablet TAKE ONE TABLET BY MOUTH AT BEDTIME 30 tablet 11 06/26/2015 at Unknown time  . flecainide (TAMBOCOR) 50 MG tablet TAKE ONE TABLET BY MOUTH TWICE DAILY 60 tablet 11 06/27/2015 at Unknown time  . ibuprofen (ADVIL,MOTRIN) 200 MG tablet Take 200 mg by mouth every 6 (six) hours as needed (pain).    Unk  . omeprazole (PRILOSEC) 20  MG capsule Take 20 mg by mouth 2 (two) times daily as needed (acid reflux).    06/27/2015 at Unknown time  . Polyethyl Glycol-Propyl Glycol (SYSTANE OP) Place 1 drop into both eyes 4 (four) times daily as needed (dryness).   Past Week at Unknown time  . pravastatin (PRAVACHOL) 40 MG tablet Take 1 tablet (40 mg total) by mouth every evening. 30 tablet 8 06/26/2015 at Unknown time  . traMADol (ULTRAM) 50 MG tablet Take 50 mg by mouth 3 (three) times daily as needed (pain).    Past Week at Unknown time  . ZETIA 10 MG tablet TAKE ONE TABLET BY MOUTH AT BEDTIME 30 tablet 11 06/26/2015 at Unknown time  . ELIQUIS 5 MG TABS tablet TAKE ONE TABLET BY MOUTH TWICE A DAY AS DIRECTED 60 tablet 5 06/24/2015 at 0800  . fluticasone (FLONASE) 50 MCG/ACT nasal spray Place 2 sprays into both nostrils daily as needed for allergies.    Unk  . meclizine  (ANTIVERT) 25 MG tablet Take 25 mg by mouth daily as needed for dizziness or nausea.    Unk  . polyethylene glycol (MIRALAX / GLYCOLAX) packet Take 17 g by mouth daily as needed (constipation).   Unk    Assessment/Plan:  1. Claudication:  PAD S/P PTA and overlapping 6 mm via Bahn covered stents. Pt is allergic to Plavix, use aspirin 325 mg for stent protection and continue Eliquis.  2. PAF - continue anticoagulation with Eliquis and rhythm/rate control with diltiazem  3. Hyperlipidemia- Continue pravastatin and zetia for cardiovascular risk reduction  4. Plan follow up lower extremity arterial doppler studies at [facility] office in a week and follow up with Dr. [name] 2-3 weeks after studies.  5. Rt thigh pain,  Mid thigh am contacted Dr. Adora Fridge and he expects this due to the PTA and the stents with so much plaque. He expects the discomfort to last 2 weeks.    6. Hematoma lt groin check art doppler to rule out pseudo.   7. Leukocytosis Dr. Adora Fridge talked to Dr. Beryle Beams- possible CLL will make appt.  With Dr. Beryle Beams      Time spent with pt. : 15 minutes. Claretha Cooper  Nurse Practitioner Student, [place]. Patient examined and documentation done in the presence of and under the direct supervision of Cecilie Kicks, NP   I have seen the pt along with Ms. Hammond, NP-Student and have examined the pt. Independently. We discussed exam and plan.  I agree with orders and plan.    Cecilie Kicks, FNP-C [facility].    Pager [phone] or after 5pm and on weekends call [phone] 06/28/2015, 6:55 AM   I have personally seen and examined this patient with Cecilie Kicks, NP.  I agree with the assessment and plan as outlined above. Left groin is soft and tender. Right leg is sore. Overall doing well. Discharge home today and f/u with Dr. [name] as planned. Continue ASA and restart Eliquis.   Misael Mcgaha 06/28/2015 10:08 AM

## 2015-06-28 NOTE — Telephone Encounter (Signed)
I spoke with [name] at the [facility] and made an appt for [name] to see [name] on July 25th at 2:30.  He is located on the ground floor east in [facility].  [name] needs to enter through the main entrance of the hospital and ask for directions to the [facility].   I lmom for patient to contact me so that I could give her the appt date and time.

## 2015-07-01 NOTE — Telephone Encounter (Signed)
I spoke with patient and made her aware of the appt with Dr Beryle Beams.  She verbalized understanding and directions.   Patient complained of leg pain "where he put the stent in" and is requesting something for pain.  The discomfort is there regardless of exertion and is located on her upper thigh.  The puncture site is "good"--no bruising, swelling, oozing, etc.  I will defer to Dr [name].

## 2015-07-01 NOTE — Telephone Encounter (Signed)
Pt called in requesting that a prescription for a pain killer be called in for her due to pain in her leg; where the stent was placed. Please f/u with her   Thanks

## 2015-07-02 NOTE — Telephone Encounter (Signed)
Okay to prescribe analgesia for her leg pain

## 2015-07-02 NOTE — Telephone Encounter (Signed)
I spoke with patient.  She currently has tramadol at home, but has gotten the most relief from applying ice to her thigh.  She reports improvement in her symptoms since applying ice yesterday.  She does not wish to proceed with any other type of pain medication at this time.  I encouraged her to contact us if any change in her symptoms or lack of improvement. She verbalized understanding.

## 2015-07-02 NOTE — Telephone Encounter (Signed)
[name], any suggestions of what would be the best medication to use for her short term leg pain?

## 2015-07-02 NOTE — Telephone Encounter (Signed)
Tramadol 50 mg 1-2 tabs q8h prn pain.  Maybe give just 30 tabs.

## 2015-07-05 ENCOUNTER — Ambulatory Visit (HOSPITAL_COMMUNITY)
Admit: 2015-07-05 | Discharge: 2015-07-05 | Disposition: A | Discharge status: Home / Self Care | Payer: Medicare Other | Source: Ambulatory Visit | Attending: Cardiology | Admitting: Cardiology

## 2015-07-05 ENCOUNTER — Other Ambulatory Visit: Payer: Self-pay | Admitting: Cardiovascular Disease

## 2015-07-05 DIAGNOSIS — I739 Peripheral vascular disease, unspecified: Secondary | ICD-10-CM | POA: Diagnosis not present

## 2015-07-05 DIAGNOSIS — Z959 Presence of cardiac and vascular implant and graft, unspecified: Secondary | ICD-10-CM

## 2015-07-05 DIAGNOSIS — Z9889 Other specified postprocedural states: Secondary | ICD-10-CM

## 2015-07-05 DIAGNOSIS — Z95820 Peripheral vascular angioplasty status with implants and grafts: Secondary | ICD-10-CM | POA: Diagnosis not present

## 2015-07-05 NOTE — Progress Notes (Signed)
Right Lower Ext. Arterial Duplex Completed. Monicka Cyran, BS, RDMS, RVT  

## 2015-07-11 ENCOUNTER — Encounter: Payer: Self-pay | Admitting: Cardiovascular Disease

## 2015-07-11 ENCOUNTER — Ambulatory Visit (INDEPENDENT_AMBULATORY_CARE_PROVIDER_SITE_OTHER): Payer: Medicare Other | Admitting: Cardiovascular Disease

## 2015-07-11 VITALS — BP 172/78 | HR 74 | Ht 65.5 in | Wt 150.0 lb

## 2015-07-11 DIAGNOSIS — R609 Edema, unspecified: Secondary | ICD-10-CM

## 2015-07-11 DIAGNOSIS — Z79899 Other long term (current) drug therapy: Secondary | ICD-10-CM

## 2015-07-11 DIAGNOSIS — R600 Localized edema: Secondary | ICD-10-CM

## 2015-07-11 DIAGNOSIS — Z959 Presence of cardiac and vascular implant and graft, unspecified: Secondary | ICD-10-CM

## 2015-07-11 DIAGNOSIS — E785 Hyperlipidemia, unspecified: Secondary | ICD-10-CM | POA: Diagnosis not present

## 2015-07-11 DIAGNOSIS — I480 Paroxysmal atrial fibrillation: Secondary | ICD-10-CM

## 2015-07-11 DIAGNOSIS — I739 Peripheral vascular disease, unspecified: Secondary | ICD-10-CM

## 2015-07-11 DIAGNOSIS — Z9889 Other specified postprocedural states: Secondary | ICD-10-CM

## 2015-07-11 MED ORDER — HYDROCHLOROTHIAZIDE 12.5 MG PO CAPS: 12.5000 mg | ORAL_CAPSULE | Freq: Every day | ORAL | Status: DC

## 2015-07-11 NOTE — Assessment & Plan Note (Signed)
Status post PTCA and restenting of an occluded right SFA stent 06/27/15 for lifestyle including claudication. I placed overlapping via Bahn covered stents in her right SFA with a improvement in circulation and her ABI which rose from 0.5-0.8. She no longer has claudication. Her left common iliac artery stent is widely patent. She has a chronically occluded left SFA.

## 2015-07-11 NOTE — Progress Notes (Signed)
07/11/2015 [name]   01/09/1951  [id number]  Primary Physician Octavio Graves, DO Primary Cardiologist: Lorretta Harp MD [name]   HPI:  The patient is a 64 year old, thin-appearing, widowed Caucasian female, mother of 2 children, grandmother to 5 grandchildren and 3 step-grandchildren who she has raised. I last saw her in the office 06/18/15.Marland Kitchen She has a history of PVOD status post left external iliac artery PTA and stenting as well as right SFA stenting by myself August 2007 with a known occluded left SFA. Her other problems include recently discontinued tobacco abuse November 11, 2012, hypertension and hyperlipidemia. She is statin intolerant. She has had PAF in the past and has been on Coumadin anticoagulation which was stopped because of nosebleeds and changed to Eliquis . She really denies chest pain, shortness of breath, or claudication. Her last Myoview performed December 27, 2009, was nonischemic. Dopplers performed a year ago showed patent stents with right ABI of 0.87 and a left of 0.65 .She had cardiac catheterization performed by Dr. Ellyn Hack revealed noncritical CAD.since I saw her back in September of last year she developed recurrent right calf claudication beginning in November or December of last year. Most recent Doppler show A decline in her ABI from 0.1-0.5 with an occluded right SFA stent suggesting in-stent restenosis. I performed angiography on her 06/27/15 revealing an occluded right SFA stent which I recanalized and restented using overlapping Viabahn  covered stents. Her ABI increased from 0.5 up to 0.8 and a claudication resolved.   Current Outpatient Prescriptions  Medication Sig Dispense Refill  . acetaminophen (TYLENOL) 500 MG tablet Take 500 mg by mouth every 6 (six) hours as needed for mild pain.     Marland Kitchen aspirin EC 81 MG EC tablet Take 1 tablet (81 mg total) by mouth daily.    Marland Kitchen diltiazem (CARDIZEM CD) 180 MG 24 hr capsule Take 180 mg by mouth  every morning.    . docusate sodium (COLACE) 100 MG capsule Take 200 mg by mouth daily.     Marland Kitchen ELIQUIS 5 MG TABS tablet TAKE ONE TABLET BY MOUTH TWICE A DAY AS DIRECTED 60 tablet 5  . fenofibrate (TRICOR) 145 MG tablet TAKE ONE TABLET BY MOUTH AT BEDTIME 30 tablet 11  . flecainide (TAMBOCOR) 50 MG tablet TAKE ONE TABLET BY MOUTH TWICE DAILY 60 tablet 11  . fluticasone (FLONASE) 50 MCG/ACT nasal spray Place 2 sprays into both nostrils daily as needed for allergies.     Marland Kitchen meclizine (ANTIVERT) 25 MG tablet Take 25 mg by mouth daily as needed for dizziness or nausea.     Marland Kitchen omeprazole (PRILOSEC) 20 MG capsule Take 20 mg by mouth 2 (two) times daily as needed (acid reflux).     Vladimir Faster Glycol-Propyl Glycol (SYSTANE OP) Place 1 drop into both eyes 4 (four) times daily as needed (dryness).    . polyethylene glycol (MIRALAX / GLYCOLAX) packet Take 17 g by mouth daily as needed (constipation).    . pravastatin (PRAVACHOL) 40 MG tablet Take 1 tablet (40 mg total) by mouth every evening. 30 tablet 8  . traMADol (ULTRAM) 50 MG tablet Take 50 mg by mouth 3 (three) times daily as needed (pain).     Marland Kitchen ZETIA 10 MG tablet TAKE ONE TABLET BY MOUTH AT BEDTIME 30 tablet 11  . hydrochlorothiazide (MICROZIDE) 12.5 MG capsule Take 1 capsule (12.5 mg total) by mouth daily. 30 capsule 11   No current facility-administered medications for this visit.  Allergies  Allergen Reactions  . Crestor [Rosuvastatin] Other (See Comments)    Itchy rash  . Demerol [Meperidine] Other (See Comments)    Unknown, can't remember   . Effexor [Venlafaxine Hcl]     Doesn't remember   . Inderal [Propranolol]     Doesn't remember   . Lovaza [Omega-3-Acid Ethyl Esters]     Doesn't remember   . Penicillins Other (See Comments)    Questionable high fever when taken and broke out in whelps.   Richardo Hanks [Oxycodone-Acetaminophen]     Doesn't remember   . Pravastatin Itching  . Warfarin And Related Other (See Comments)    Caused  nose bleeds  . Atenolol Rash  . Lipitor [Atorvastatin] Rash  . Nexium [Esomeprazole Magnesium] Rash  . Plavix [Clopidogrel Bisulfate] Rash  . Simvastatin Itching and Rash    Also lipitor intolerant  . Trilipix [Choline Fenofibrate] Other (See Comments)    Doesn't remember     History   Social History  . Marital Status: Widowed    Spouse Name: N/A  . Number of Children: 2  . Years of Education: 12   Occupational History  . CNA    Social History Main Topics  . Smoking status: Former Smoker -- 1.00 packs/day for 45 years    Types: Cigarettes    Quit date: 11/11/2012  . Smokeless tobacco: Never Used  . Alcohol Use: No  . Drug Use: No  . Sexual Activity: No   Other Topics Concern  . Not on file   Social History Narrative     Review of Systems: General: negative for chills, fever, night sweats or weight changes.  Cardiovascular: negative for chest pain, dyspnea on exertion, edema, orthopnea, palpitations, paroxysmal nocturnal dyspnea or shortness of breath Dermatological: negative for rash Respiratory: negative for cough or wheezing Urologic: negative for hematuria Abdominal: negative for nausea, vomiting, diarrhea, bright red blood per rectum, melena, or hematemesis Neurologic: negative for visual changes, syncope, or dizziness All other systems reviewed and are otherwise negative except as noted above.    Blood pressure 172/78, pulse 74, height 5' 5.5" (1.664 m), weight 150 lb (68.04 kg).  General appearance: alert and no distress Neck: no adenopathy, no carotid bruit, no JVD, supple, symmetrical, trachea midline and thyroid not enlarged, symmetric, no tenderness/mass/nodules Lungs: clear to auscultation bilaterally Heart: regular rate and rhythm, S1, S2 normal, no murmur, click, rub or gallop Extremities: extremities normal, atraumatic, no cyanosis or edema and palpable pedal pulse on the right  EKG not performed today  ASSESSMENT AND PLAN:   S/P arterial  stent right SFA overlapping Bahn covered stents 06/27/2015 Status post PTCA and restenting of an occluded right SFA stent 06/27/15 for lifestyle including claudication. I placed overlapping via Bahn covered stents in her right SFA with a improvement in circulation and her ABI which rose from 0.5-0.8. She no longer has claudication. Her left common iliac artery stent is widely patent. She has a chronically occluded left SFA.  PAF (paroxysmal atrial fibrillation), maintaing SR History of PAF maintaining sinus rhythm on Eliquis  CAD (coronary artery disease), per cath 04/27/13, non obstructive disease 20-30% LM; LAD 30%; 50-60% OM2; RCA 40%; EF 65-70% History of CAD demonstrated to be noncritical by performing Dr. Ellyn Hack within the last several years. She denies chest pain or shortness of breath.  Hyperlipidemia History of hyperlipidemia on Zetia and pravastatin with recent lipid profile performed 06/25/15 revealing a total cholesterol 156, LDL 48 and HDL of 36.  Lorretta Harp MD FACP,FACC,FAHA, [facility] 07/11/2015 11:23 AM

## 2015-07-11 NOTE — Assessment & Plan Note (Signed)
History of CAD demonstrated to be noncritical by performing Dr. Ellyn Hack within the last several years. She denies chest pain or shortness of breath.

## 2015-07-11 NOTE — Assessment & Plan Note (Signed)
>>  ASSESSMENT AND PLAN FOR MIXED HYPERLIPIDEMIA WRITTEN ON 07/11/2015 11:23 AM BY BERRY, JONATHAN J, MD  History of hyperlipidemia on Zetia  and pravastatin  with recent lipid profile performed 06/25/15 revealing a total cholesterol 156, LDL 48 and HDL of 36.

## 2015-07-11 NOTE — Assessment & Plan Note (Signed)
History of hyperlipidemia on Zetia and pravastatin with recent lipid profile performed 06/25/15 revealing a total cholesterol 156, LDL 48 and HDL of 36.

## 2015-07-11 NOTE — Assessment & Plan Note (Signed)
History of PAF maintaining sinus rhythm on Eliquis 

## 2015-07-11 NOTE — Patient Instructions (Signed)
Medication Instructions:   Start hydrochlorothiazide 12.5mg  daily  Labwork:  Blood work to be done in 2 weeks  Testing/Procedures:  Lower extremity arterial doppler (to be done every 6 months)- During this test, ultrasound is used to evaluate arterial blood flow in the legs. Allow approximately one hour for this exam.     Follow-Up:  6 months with Dr [name]  Any Other Special Instructions Will Be Listed Below (If Applicable).  Dr [name] has recommened that you wear knee-high compression hose on that right leg. Put the hose on in the morning and take it off before bed.

## 2015-07-15 ENCOUNTER — Ambulatory Visit (INDEPENDENT_AMBULATORY_CARE_PROVIDER_SITE_OTHER): Payer: Medicare Other | Admitting: Oncology

## 2015-07-15 ENCOUNTER — Encounter: Payer: Self-pay | Admitting: Oncology

## 2015-07-15 VITALS — BP 188/84 | HR 62 | Temp 98.2°F | Wt 151.3 lb

## 2015-07-15 DIAGNOSIS — D72829 Elevated white blood cell count, unspecified: Secondary | ICD-10-CM | POA: Insufficient documentation

## 2015-07-15 HISTORY — DX: Elevated white blood cell count, unspecified: D72.829

## 2015-07-15 LAB — SEDIMENTATION RATE: SED RATE: 1 mm/h (ref 0–30)

## 2015-07-15 LAB — SAVE SMEAR

## 2015-07-15 NOTE — Progress Notes (Signed)
Patient ID: [name], female   DOB: 05/31/1951, 64 y.o.   MRN: [id number] New Patient Hematology   [name] [id number] [birth year] 64 y.o. 07/15/2015  CC: Dr. Quay Burow; Dr. Octavio Graves   Reason for referral: Unexplained leukocytosis   HPI:  64 year old woman with hypertension, peripheral vascular disease, and paroxysmal atrial fibrillation on chronic anticoagulation with apixiban. She is a former smoker who stopped 3 years ago. Starting in 2006, she has required placement of lower extremity vascular stents for arterial occlusive disease. She has had 3 stents placed to the right superficial femoral artery. Most recently a stent was replaced on 06/27/2015 for occlusion of the previous stent. She has had prior left external iliac artery stent placement in August 2007. At time of recent stent placement she was noted to have an elevated white blood count 17,300 on 06/25/2015. Repeat on July 7 was 16,000 with 84% neutrophils, 7 lymphocytes, 4 monocytes, 4 eosinophils, 1 basophil. Hemoglobin 16, hematocrit 50.5, platelet count 256,000. White count recorded 12/09/2009 was normal at 7900. First elevated white count noted 04/26/2013: 13,300. On 03/19/2015: 14,500. She has no other foreign bodies other than the stents. She has no chronic gum disease in fact all of her teeth have been extracted. She is on no high-dose steroids. She rarely uses Flonase spray. A chest radiograph was normal on 06/25/2015. She denies any fevers, night sweats, loss of appetite, or weight loss. She denies a history of tuberculosis. She denied dysphagia. She has reflux esophagitis but denies peptic ulcers. No known thyroid disease. No signs or symptoms of a collagen vascular disease. She denies inflammatory arthritis and specifically denies a history of rheumatoid arthritis or lupus. She is lactose intolerant. She gets a lot of abdominal bloating particularly postprandial. She was recently told that she had a fatty  liver but a chemistry profile done 2 weeks ago shows normal liver functions. No hematochezia, melena, hematuria, or vaginal bleeding.  There is no family history of any blood disorder or cancer.   PMH: Past Medical History  Diagnosis Date  . Hypertension   . PVD (peripheral vascular disease) with claudication 07/2006    previous L ext iliac stent and rt SFA stent, known occluded Lt SFA  . Paroxysmal atrial fibrillation     hx of a fib on ASA  AND ELIQUIS   . H/O cardiovascular stress test 12/27/2009    negative for ischemia  . NSTEMI (non-ST elevated myocardial infarction) 04/27/2013  . S/P cardiac cath 04/27/13    NON OBSTRUCTIVE DISEASE, 2+ mr, ef 65-70%  . CAD (coronary artery disease), per cath 04/27/13, non obstructive disease, EF 65-70% 05/11/2013  . Mitral regurgitation,2+ by cath 05/11/2013  . Statin intolerance   . Carotid disease, bilateral 09/2013    Lt carotid 50-69% stentosis, less on rt  . Hyperlipidemia   . Dysrhythmia   . Stroke 2006    SWELLING ALL OVER AND RT SIDE OF FACE DRAWN AND SPEECH SLURRED AND NUMBNESS ON RIGHT SIDE-- ALL RESOLVED  . GERD (gastroesophageal reflux disease)   . Pain     BOTH KNEES - PT HAS TORN MENISCUS LEFT KNEE  . Glaucoma   . Arthritis   . Fatty liver   . Leukocytosis 07/15/2015    Past Surgical History  Procedure Laterality Date  . Ovarian cyst removal Right   . Iliac artery stent  07/2006    external iliac and Rt SFA stent 07/2006 AND THE LEFT WAS IN 2006  . C section 1977    .  Orif rt wrist  fracture  1983  . Knee arthroscopy with medial menisectomy Left 03/27/2014    Procedure: left knee arthorscopy with medial chondraplasty of the medial femoral and patella, medial microfracture technique of medial femoral condyl;  Surgeon: Tobi Bastos, MD;  Location: [facility];  Service: Orthopedics;  Laterality: Left;  . Left heart catheterization with coronary angiogram Right 04/27/2013    Procedure: LEFT HEART CATHETERIZATION WITH CORONARY ANGIOGRAM;   Surgeon: Leonie Man, MD;  Location[facility] CATH LAB;  Service: Cardiovascular;  Laterality: Right;  . Cardiac catheterization  04/27/13    non occlusive disease with mild to mod. calcified lesions in ostial LM and proximal LAD and moderate prox. RC AND DISTAL AV GROOVE LCX, ef  . Tubal ligation  1983  . Cataract extraction w/phaco Right 03/21/2015    Procedure: CATARACT EXTRACTION PHACO AND INTRAOCULAR LENS PLACEMENT RIGHT EYE;  Surgeon: Baruch Goldmann, MD;  Location: [facility];  Service: Ophthalmology;  Laterality: Right;  CDE:5.10  . Cataract extraction w/phaco Left 05/16/2015    Procedure: CATARACT EXTRACTION PHACO AND INTRAOCULAR LENS PLACEMENT (IOC);  Surgeon: Baruch Goldmann, MD;  Location: [facility];  Service: Ophthalmology;  Laterality: Left;  CDE 5.57  . Sfa Right 06/27/2015    overlapping Bahn covered stents  . Peripheral vascular catheterization N/A 06/27/2015    Procedure: Lower Extremity Angiography;  Surgeon: Lorretta Harp, MD;  Location: [place] CV LAB;  Service: Cardiovascular;  Laterality: N/A;    Allergies: Allergies  Allergen Reactions  . Crestor [Rosuvastatin] Other (See Comments)    Itchy rash  . Demerol [Meperidine] Other (See Comments)    Unknown, can't remember   . Effexor [Venlafaxine Hcl]     Doesn't remember   . Inderal [Propranolol]     Doesn't remember   . Lovaza [Omega-3-Acid Ethyl Esters]     Doesn't remember   . Penicillins Other (See Comments)    Questionable high fever when taken and broke out in whelps.   Richardo Hanks [Oxycodone-Acetaminophen]     Doesn't remember   . Pravastatin Itching  . Warfarin And Related Other (See Comments)    Caused nose bleeds  . Atenolol Rash  . Lipitor [Atorvastatin] Rash  . Nexium [Esomeprazole Magnesium] Rash  . Plavix [Clopidogrel Bisulfate] Rash  . Simvastatin Itching and Rash    Also lipitor intolerant  . Trilipix [Choline Fenofibrate] Other (See Comments)    Doesn't remember     Medications:  Current outpatient  prescriptions:  .  acetaminophen (TYLENOL) 500 MG tablet, Take 500 mg by mouth every 6 (six) hours as needed for mild pain. , Disp: , Rfl:  .  aspirin EC 81 MG EC tablet, Take 1 tablet (81 mg total) by mouth daily., Disp: , Rfl:  .  diltiazem (CARDIZEM CD) 180 MG 24 hr capsule, Take 180 mg by mouth every morning., Disp: , Rfl:  .  docusate sodium (COLACE) 100 MG capsule, Take 200 mg by mouth daily. , Disp: , Rfl:  .  ELIQUIS 5 MG TABS tablet, TAKE ONE TABLET BY MOUTH TWICE A DAY AS DIRECTED, Disp: 60 tablet, Rfl: 5 .  fenofibrate (TRICOR) 145 MG tablet, TAKE ONE TABLET BY MOUTH AT BEDTIME, Disp: 30 tablet, Rfl: 11 .  flecainide (TAMBOCOR) 50 MG tablet, TAKE ONE TABLET BY MOUTH TWICE DAILY, Disp: 60 tablet, Rfl: 11 .  fluticasone (FLONASE) 50 MCG/ACT nasal spray, Place 2 sprays into both nostrils daily as needed for allergies. , Disp: , Rfl:  .  hydrochlorothiazide (MICROZIDE) 12.5 MG capsule, Take 1 capsule (12.5 mg total) by mouth daily., Disp: 30 capsule, Rfl: 11 .  meclizine (ANTIVERT) 25 MG tablet, Take 25 mg by mouth daily as needed for dizziness or nausea. , Disp: , Rfl:  .  omeprazole (PRILOSEC) 20 MG capsule, Take 20 mg by mouth 2 (two) times daily as needed (acid reflux). , Disp: , Rfl:  .  Polyethyl Glycol-Propyl Glycol (SYSTANE OP), Place 1 drop into both eyes 4 (four) times daily as needed (dryness)., Disp: , Rfl:  .  polyethylene glycol (MIRALAX / GLYCOLAX) packet, Take 17 g by mouth daily as needed (constipation)., Disp: , Rfl:  .  pravastatin (PRAVACHOL) 40 MG tablet, Take 1 tablet (40 mg total) by mouth every evening., Disp: 30 tablet, Rfl: 8 .  traMADol (ULTRAM) 50 MG tablet, Take 50 mg by mouth 3 (three) times daily as needed (pain). , Disp: , Rfl:  .  ZETIA 10 MG tablet, TAKE ONE TABLET BY MOUTH AT BEDTIME, Disp: 30 tablet, Rfl: 11  Social History: She lives in [place]. She worked for many years at a [employer] now out on disability.  she quit smoking about 3  years ago.  She has a 45 pack-year smoking history.  She does not drink alcohol or use illicit drugs.  Family History: Family History  Problem Relation Age of Onset  . CAD Mother   . Lung cancer Mother   . Heart attack Mother   . CAD Father   . Heart disease Father   . Heart attack Father 72    died in hes 62's with heart disease  . Healthy Sister   . Liver cancer Brother   . Healthy Sister   . CAD Brother     has had CABG  . CAD Brother     Review of Systems: See history of present illness Remaining ROS negative.  Physical Exam: Blood pressure 188/84, pulse 62, temperature 98.2 F (36.8 C), temperature source Oral, weight 151 lb 4.8 oz (68.629 kg), SpO2 98 %. Wt Readings from Last 3 Encounters:  07/15/15 151 lb 4.8 oz (68.629 kg)  07/11/15 150 lb (68.04 kg)  06/28/15 154 lb 12.2 oz (70.2 kg)     General appearance: Well-nourished Caucasian woman HENNT: Pharynx no erythema, exudate, mass, or ulcer. No thyromegaly or thyroid nodules Lymph nodes: No cervical, supraclavicular, or axillary lymphadenopathy Breasts:  Lungs: Clear to auscultation, resonant to percussion throughout Heart: Regular rhythm, questionable faint aortic systolic murmur, no gallop, no rub, no click, no edema Abdomen: Soft, nontender, normal bowel sounds, no mass, no organomegaly Extremities: No edema, no calf tenderness Musculoskeletal: no joint deformities GU:  Vascular: Carotid pulses 2+, no bruits,  Neurologic: Alert, oriented, PERRLA, bilateral intraocular lens implants, optic discs sharp and vessels normal, no hemorrhage or exudate, cranial nerves grossly normal, motor strength 5 over 5, reflexes 1+ symmetric, upper body coordination normal, gait normal, moderately decreased vibration sensation by tuning fork exam over the fingertips Skin: No rash. Skin is thin with multiple small ecchymoses on the forearms.    Lab Results: Lab Results  Component Value Date   WBC 17.4* 06/28/2015   HGB 14.6  06/28/2015   HCT 46.4* 06/28/2015   MCV 80.6 06/28/2015   PLT 255 06/28/2015     Chemistry      Component Value Date/Time   NA 136 06/28/2015 0340   K 4.1 06/28/2015 0340   CL 102 06/28/2015 0340   CO2 25 06/28/2015 0340  BUN 13 06/28/2015 0340   CREATININE 0.91 06/28/2015 0340      Component Value Date/Time   CALCIUM 8.7* 06/28/2015 0340   ALKPHOS 74 06/25/2015 0955   AST 24 06/25/2015 0955   ALT 20 06/25/2015 0955   BILITOT 0.7 06/25/2015 0955       Review of peripheral blood film: Pending   Radiological Studies: Normal chest x-ray 06/25/2015    Impression: Nonspecific leukocytosis with left shift I think this is going to turn out to be a foreign body reaction to her bilateral vascular stents. She does have a high hemoglobin for a menopausal woman who has stopped smoking. One basophil was seen on a white count differential. This raises the possibility of an underlying myeloproliferative disorder.  Plan: Repeat blood count today with peripheral blood film for me to review. Check ESR. Check for the presence of a JAK- 2 gene mutation. If above unrevealing, then check for bcr-abl.      Annia Belt, MD 07/15/2015, 4:01 PM

## 2015-07-15 NOTE — Patient Instructions (Signed)
To lab today Return 4-6 weeks no lab

## 2015-07-16 LAB — CBC WITH DIFFERENTIAL/PLATELET
Basophils Absolute: 0.2 10*3/uL — ABNORMAL HIGH (ref 0.0–0.1)
Basophils Relative: 1 % (ref 0–1)
Eosinophils Absolute: 0.5 10*3/uL (ref 0.0–0.7)
Eosinophils Relative: 3 % (ref 0–5)
HCT: 47.2 % — ABNORMAL HIGH (ref 36.0–46.0)
Hemoglobin: 15.7 g/dL — ABNORMAL HIGH (ref 12.0–15.0)
LYMPHS ABS: 1.4 10*3/uL (ref 0.7–4.0)
LYMPHS PCT: 8 % — AB (ref 12–46)
MCH: 25.9 pg — ABNORMAL LOW (ref 26.0–34.0)
MCHC: 33.3 g/dL (ref 30.0–36.0)
MCV: 77.9 fL — ABNORMAL LOW (ref 78.0–100.0)
MONOS PCT: 3 % (ref 3–12)
MPV: 10.3 fL (ref 8.6–12.4)
Monocytes Absolute: 0.5 10*3/uL (ref 0.1–1.0)
NEUTROS ABS: 14.5 10*3/uL — AB (ref 1.7–7.7)
NEUTROS PCT: 85 % — AB (ref 43–77)
PLATELETS: 291 10*3/uL (ref 150–400)
RBC: 6.06 MIL/uL — ABNORMAL HIGH (ref 3.87–5.11)
RDW: 19.6 % — AB (ref 11.5–15.5)
WBC: 17 10*3/uL — ABNORMAL HIGH (ref 4.0–10.5)

## 2015-07-19 LAB — JAK2 V617F, RFLX EXONS 12 AND 13: JAK2 V617F: DETECTED

## 2015-07-27 LAB — BASIC METABOLIC PANEL
BUN: 23 mg/dL (ref 7–25)
CHLORIDE: 98 mmol/L (ref 98–110)
CO2: 27 mmol/L (ref 20–31)
Calcium: 9.4 mg/dL (ref 8.6–10.4)
Creat: 1.02 mg/dL — ABNORMAL HIGH (ref 0.50–0.99)
Glucose, Bld: 66 mg/dL (ref 65–99)
POTASSIUM: 4.9 mmol/L (ref 3.5–5.3)
Sodium: 138 mmol/L (ref 135–146)

## 2015-07-30 ENCOUNTER — Ambulatory Visit (HOSPITAL_COMMUNITY)
Admission: RE | Admit: 2015-07-30 | Discharge: 2015-07-30 | Disposition: A | Discharge status: Home / Self Care | Payer: Medicare Other | Source: Ambulatory Visit | Attending: Urology | Admitting: Urology

## 2015-07-30 DIAGNOSIS — I739 Peripheral vascular disease, unspecified: Secondary | ICD-10-CM

## 2015-07-30 DIAGNOSIS — I6523 Occlusion and stenosis of bilateral carotid arteries: Secondary | ICD-10-CM | POA: Diagnosis not present

## 2015-08-20 ENCOUNTER — Encounter: Payer: Self-pay | Admitting: Oncology

## 2015-08-20 ENCOUNTER — Ambulatory Visit (INDEPENDENT_AMBULATORY_CARE_PROVIDER_SITE_OTHER): Payer: Medicare Other | Admitting: Oncology

## 2015-08-20 VITALS — BP 153/71 | HR 68 | Temp 98.2°F | Ht 65.5 in | Wt 149.2 lb

## 2015-08-20 DIAGNOSIS — D45 Polycythemia vera: Secondary | ICD-10-CM | POA: Insufficient documentation

## 2015-08-20 DIAGNOSIS — D72829 Elevated white blood cell count, unspecified: Secondary | ICD-10-CM

## 2015-08-20 HISTORY — DX: Polycythemia vera: D45

## 2015-08-20 NOTE — Patient Instructions (Signed)
Begin phlebotomy at [facility] Stay unit on Monday September 12.  Monthly x 3 then we will re-evaluate Return visit 3 months, November 8 - lab day of visit

## 2015-08-20 NOTE — Progress Notes (Signed)
Patient ID: [name], female   DOB: 09/18/1951, 64 y.o.   MRN: [id number] The patient returns today with her good friend to discuss outstanding results of recent laboratory obtained in further evaluation of unexplained leukocytosis. I initially thought that this was an inflammatory process perhaps related to foreign body reaction to multiple vascular stents however, looking back at her blood profile over the last few years, she actually had a chronic anemia with hemoglobins running 12 g with MCV 90 until 03/20/2014  Hemoglobin began to rise first to 14.1 with subsequent values this year up to 16 g recorded on 06/27/2015. She had normal white blood counts back in December 2010. Next white count recorded was in May 2014 at 13,300. Subsequent counts have all been 10,000 or above with most recent count on 07/15/2015 at 17,000 with 85% neutrophils. 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 Since this affects stem cells, there can be a panmyelosis with associated elevated white count and platelet count. Her platelet count has been normal. I discussed the diagnosis of myeloproliferative disorder with her in detail and gave her written notes as we talked. Predominant complication would be predisposition towards thrombosis. She is already on aspirin and Xarelto so she is covered in that regard. With respect to thrombotic risk of increased blood viscosity, current guideline recommends keeping hematocrit less than 45%. This should be accomplished very easily in this woman who is peak hematocrit has not exceeded 52%. We discussed a phlebotomy program. I would plan on a phlebotomy of 500 mL's of blood monthly 3 then reevaluate. I believe we will see a rapid fall in her hematocrit and then be able to space out subsequent phlebotomies to an every 3-4 month interval. She had a good understanding of  all we discussed. I have scheduled her first phlebotomy for Monday, September 12.  Approximately 30 minutes spent in direct face-to-face contact with this patient and her friend to review the above. Additional time spent putting in orders to initiate a phlebotomy program.   Murriel Hopper, MD, FACP  Hematology-Oncology/Internal Medicine

## 2015-08-30 ENCOUNTER — Other Ambulatory Visit (HOSPITAL_COMMUNITY): Payer: Self-pay | Admitting: *Deleted

## 2015-09-02 ENCOUNTER — Encounter (HOSPITAL_COMMUNITY)
Admission: RE | Admit: 2015-09-02 | Discharge: 2015-09-02 | Disposition: A | Discharge status: Home / Self Care | Payer: Medicare Other | Source: Ambulatory Visit | Attending: Oncology | Admitting: Oncology

## 2015-09-02 DIAGNOSIS — D45 Polycythemia vera: Secondary | ICD-10-CM | POA: Insufficient documentation

## 2015-09-02 LAB — POCT HEMOGLOBIN-HEMACUE: Hemoglobin: 14.7 g/dL (ref 12.0–15.0)

## 2015-09-02 NOTE — Progress Notes (Signed)
Hemoglobin>14 today, phlebotomized 500cc from right [facility] and pt toelrated procedure well.

## 2015-09-04 ENCOUNTER — Ambulatory Visit: Payer: Medicare Other | Admitting: Cardiovascular Disease

## 2015-09-20 ENCOUNTER — Other Ambulatory Visit: Payer: Self-pay | Admitting: Cardiovascular Disease

## 2015-09-30 ENCOUNTER — Encounter (HOSPITAL_COMMUNITY)
Admission: RE | Admit: 2015-09-30 | Discharge: 2015-09-30 | Disposition: A | Discharge status: Home / Self Care | Payer: Medicare Other | Source: Ambulatory Visit | Attending: Oncology | Admitting: Oncology

## 2015-09-30 DIAGNOSIS — D45 Polycythemia vera: Secondary | ICD-10-CM | POA: Diagnosis not present

## 2015-09-30 LAB — POCT HEMOGLOBIN-HEMACUE: Hemoglobin: 14.4 g/dL (ref 12.0–15.0)

## 2015-09-30 NOTE — Progress Notes (Signed)
Pt came in today for scheduled therapeutic phlebotomy.  HemoCue prior to procedure was 14.4  Right AC was used.  500 cc of blood was removed.  Pt tolerated procedure well.  Will continue to monitor

## 2015-10-28 ENCOUNTER — Other Ambulatory Visit (HOSPITAL_COMMUNITY): Payer: Self-pay | Admitting: *Deleted

## 2015-10-29 ENCOUNTER — Encounter: Payer: Self-pay | Admitting: Oncology

## 2015-10-29 ENCOUNTER — Encounter (HOSPITAL_COMMUNITY)
Admission: RE | Admit: 2015-10-29 | Discharge: 2015-10-29 | Disposition: A | Discharge status: Home / Self Care | Payer: Medicare Other | Source: Ambulatory Visit | Attending: Oncology | Admitting: Oncology

## 2015-10-29 ENCOUNTER — Ambulatory Visit (INDEPENDENT_AMBULATORY_CARE_PROVIDER_SITE_OTHER): Payer: Medicare Other | Admitting: Oncology

## 2015-10-29 VITALS — BP 159/66 | HR 70 | Temp 98.0°F | Ht 65.5 in | Wt 150.3 lb

## 2015-10-29 DIAGNOSIS — D72829 Elevated white blood cell count, unspecified: Secondary | ICD-10-CM

## 2015-10-29 DIAGNOSIS — D45 Polycythemia vera: Secondary | ICD-10-CM | POA: Insufficient documentation

## 2015-10-29 DIAGNOSIS — Z7901 Long term (current) use of anticoagulants: Secondary | ICD-10-CM

## 2015-10-29 DIAGNOSIS — I2510 Atherosclerotic heart disease of native coronary artery without angina pectoris: Secondary | ICD-10-CM

## 2015-10-29 DIAGNOSIS — I480 Paroxysmal atrial fibrillation: Secondary | ICD-10-CM

## 2015-10-29 DIAGNOSIS — I252 Old myocardial infarction: Secondary | ICD-10-CM

## 2015-10-29 DIAGNOSIS — I739 Peripheral vascular disease, unspecified: Secondary | ICD-10-CM

## 2015-10-29 LAB — COMPREHENSIVE METABOLIC PANEL
ALBUMIN: 3.7 g/dL (ref 3.5–5.0)
ALK PHOS: 65 U/L (ref 38–126)
ALT: 16 U/L (ref 14–54)
ANION GAP: 7 (ref 5–15)
AST: 21 U/L (ref 15–41)
BILIRUBIN TOTAL: 0.6 mg/dL (ref 0.3–1.2)
BUN: 20 mg/dL (ref 6–20)
CALCIUM: 9.4 mg/dL (ref 8.9–10.3)
CO2: 29 mmol/L (ref 22–32)
Chloride: 98 mmol/L — ABNORMAL LOW (ref 101–111)
Creatinine, Ser: 1.03 mg/dL — ABNORMAL HIGH (ref 0.44–1.00)
GFR calc Af Amer: >60 mL/min (ref >60)
GFR calc non Af Amer: 56 mL/min — ABNORMAL LOW (ref >60)
GLUCOSE: 104 mg/dL — AB (ref 65–99)
Potassium: 4.9 mmol/L (ref 3.5–5.1)
SODIUM: 134 mmol/L — AB (ref 135–145)
Total Protein: 6.8 g/dL (ref 6.5–8.1)

## 2015-10-29 LAB — CBC WITH DIFFERENTIAL/PLATELET
BASOS PCT: 1 %
Basophils Absolute: 0.2 10*3/uL — ABNORMAL HIGH (ref 0.0–0.1)
Eosinophils Absolute: 0.5 10*3/uL (ref 0.0–0.7)
Eosinophils Relative: 3 %
HCT: 42.7 % (ref 36.0–46.0)
HEMOGLOBIN: 12.9 g/dL (ref 12.0–15.0)
Lymphocytes Relative: 9 %
Lymphs Abs: 1.7 10*3/uL (ref 0.7–4.0)
MCH: 24 pg — ABNORMAL LOW (ref 26.0–34.0)
MCHC: 30.2 g/dL (ref 30.0–36.0)
MCV: 79.5 fL (ref 78.0–100.0)
Monocytes Absolute: 0.5 10*3/uL (ref 0.1–1.0)
Monocytes Relative: 2 %
NEUTROS PCT: 86 %
Neutro Abs: 17.1 10*3/uL — ABNORMAL HIGH (ref 1.7–7.7)
Platelets: 337 10*3/uL (ref 150–400)
RBC: 5.37 MIL/uL — AB (ref 3.87–5.11)
RDW: 17 % — AB (ref 11.5–15.5)
WBC: 20 10*3/uL — AB (ref 4.0–10.5)

## 2015-10-29 LAB — FERRITIN: Ferritin: 18 ng/mL (ref 11–307)

## 2015-10-29 MED ORDER — LIDOCAINE HCL 2 % IJ SOLN: 0.1000 mL | Freq: Once | INTRAMUSCULAR | Status: DC

## 2015-10-29 NOTE — Progress Notes (Signed)
Phlebotomy appt scheduled 12/6 @ 1000 AM; pt awared.

## 2015-10-29 NOTE — Patient Instructions (Signed)
Next phlebotomy on Tuesday 11/26/15, we will then change to every 2 months Visit with Dr Darnell Level in 4-5 months

## 2015-10-29 NOTE — Progress Notes (Signed)
Patient ID: [name], female   DOB: 1950/12/26, 64 y.o.   MRN: [id number] Hematology and Oncology Follow Up Visit  [name] [id number] January 06, 1951 64 y.o. 10/29/2015 11:15 AM   Principle Diagnosis: Encounter Diagnoses  Name Primary?  . Polycythemia vera ([place]) Yes  . Leukocytosis   Clinical Summary: Pleasant 64 year old woman I initially evaluated on 07/15/2015 for unexplained leukocytosis. I initially thought we were dealing with an inflammatory process related to a foreign body reaction to multiple vascular stents, however in reviewing her laboratory data, I noted that she had a chronic anemia with hemoglobins running 12 g with MCV 90 until 03/20/2014 Hemoglobin began to rise first to 14.1 with subsequent values in 2016 up to 16 g recorded on 06/27/2015. She had normal white blood counts back in December 2010. Next white count recorded was in May 2014 at 13,300. Subsequent counts were all  10,000 or above with a  count on 07/15/2015 at 17,000 with 85% neutrophils. 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 Since this affects stem cells, there can be a panmyelosis with associated elevated white count and platelet count. Her platelet count has been normal. I did not appreciate any organomegaly on my initial exam but on my subsequent exams I was able to palpate an enlarged spleen.  discussed the diagnosis of myeloproliferative disorder with her in detail at time of her 08/20/2015 follow-up visit.  . Predominant complication would be predisposition towards thrombosis. She was already on aspirin and Xarelto so she is covered in that regard. With respect to thrombotic risk of increased blood viscosity, current guideline recommends keeping hematocrit less than 45%. This should be accomplished very easily in this woman who is peak hematocrit has not exceeded 52%. We discussed a  phlebotomy program. I initiated a phlebotomy program removing of 500 mL's of blood monthly 3 with plans to reevaluate after that. I anticipated seeing a rapid fall in her hematocrit and then be able to space out subsequent phlebotomies to an every 3-4 month interval. She had a good understanding of all we discussed. She had her first phlebotomy  Monday, September 12.   Interim History:   She has now had 3 monthly phlebotomies. Except for post phlebotomy fatigue she has tolerated the procedures well. Pre-phlebotomy hematocrit this morning was 43% which is optimal. Not surprisingly, white count remains elevated at 20,000. Platelets normal at 337,000. She has had no other interim medical problems. Her cardiac issues are currently stable. She continues on Eloquis anticoagulation, Cardizem, and flecainide for paroxysmal atrial fibrillation.   Medications: reviewed  Allergies:  Allergies  Allergen Reactions  . Crestor [Rosuvastatin] Other (See Comments)    Itchy rash  . Demerol [Meperidine] Other (See Comments)    Unknown, can't remember   . Effexor [Venlafaxine Hcl]     Doesn't remember   . Inderal [Propranolol]     Doesn't remember   . Lovaza [Omega-3-Acid Ethyl Esters]     Doesn't remember   . Penicillins Other (See Comments)    Questionable high fever when taken and broke out in whelps.   Richardo Hanks [Oxycodone-Acetaminophen]     Doesn't remember   . Pravastatin Itching  . Warfarin And Related Other (See Comments)    Caused nose bleeds  . Atenolol Rash  . Lipitor [Atorvastatin] Rash  . Nexium [Esomeprazole Magnesium] Rash  . Plavix [Clopidogrel Bisulfate] Rash  . Simvastatin Itching  and Rash    Also lipitor intolerant  . Trilipix [Choline Fenofibrate] Other (See Comments)    Doesn't remember     Review of Systems: See HPI Remaining ROS negative:   Physical Exam: Blood pressure 159/66, pulse 70, temperature 98 F (36.7 C), temperature source Oral, height 5' 5.5" (1.664 m),  weight 150 lb 4.8 oz (68.176 kg), SpO2 98 %. Wt Readings from Last 3 Encounters:  10/29/15 150 lb 4.8 oz (68.176 kg)  10/29/15 146 lb (66.225 kg)  09/02/15 149 lb (67.586 kg)     General appearance: well nourished Caucasian woman HENNT: Pharynx no erythema, exudate, mass, or ulcer. No thyromegaly or thyroid nodules Lymph nodes: No cervical, supraclavicular, or axillary lymphadenopathy Breasts:  Lungs: Clear to auscultation, resonant to percussion throughout Heart: Regular rhythm, no murmur, no gallop, no rub, no click, no edema Abdomen: Soft, nontender, normal bowel sounds, no mass, spleen 3-4 cm below left costal margin Extremities: No edema, no calf tenderness Musculoskeletal: no joint deformities GU:  Vascular: Carotid pulses 2+, no bruits, distal pulses: Dorsalis pedis 1+ symmetric Neurologic: Alert, oriented, PERRLA, bilateral intraocular lens implants, optic discs sharp and vessels normal, no hemorrhage or exudate, cranial nerves grossly normal, motor strength 5 over 5, reflexes 1+ symmetric, upper body coordination normal, gait normal, Skin: No rash; positive purpura on the skin of the dorsum of her hands and wrists  Lab Results: CBC W/Diff    Component Value Date/Time   WBC 20.0* 10/29/2015 1051   RBC 5.37* 10/29/2015 1051   HGB 12.9 10/29/2015 1051   HCT 42.7 10/29/2015 1051   PLT 337 10/29/2015 1051   MCV 79.5 10/29/2015 1051   MCH 24.0* 10/29/2015 1051   MCHC 30.2 10/29/2015 1051   RDW 17.0* 10/29/2015 1051   LYMPHSABS 1.7 10/29/2015 1051   MONOABS 0.5 10/29/2015 1051   EOSABS 0.5 10/29/2015 1051   BASOSABS 0.2* 10/29/2015 1051     Chemistry      Component Value Date/Time   NA 138 07/26/2015 1022   K 4.9 07/26/2015 1022   CL 98 07/26/2015 1022   CO2 27 07/26/2015 1022   BUN 23 07/26/2015 1022   CREATININE 1.02* 07/26/2015 1022   CREATININE 0.91 06/28/2015 0340      Component Value Date/Time   CALCIUM 9.4 07/26/2015 1022   ALKPHOS 74 06/25/2015 0955    AST 24 06/25/2015 0955   ALT 20 06/25/2015 0955   BILITOT 0.7 06/25/2015 0955     Orders entered for Phlebotomy program  Impression:  #1. Polycythemia vera new diagnosis July 2016 Nice early response to phlebotomy. I will do one additional phlebotomy at a one month interval then decreased to every 2 months. Target hematocrit less than or equal to 45%. Continue low-dose aspirin.  #2. Chronic anticoagulation due to paroxysmal age of fibrillation and extensive peripheral vascular disease  #3. Coronary artery disease with history of a an STEMI May 2014  #4. Paroxysmal atrial fibrillation on treatment outlined above  #5. Cerebrovascular disease with bilateral carotid stenosis  #6. Peripheral vascular disease with claudication status post previous left external iliac stent and right SFA stent.  #7. History of TIA 2006  #8. Hyperlipidemia  #9. Glaucoma.  #10. GERD  #11. Degenerative arthritis of the knees  CC: Patient Care Team: Octavio Graves, DO as PCP - General Lorretta Harp, MD as Consulting Physician (Cardiology) Annia Belt, MD as Consulting Physician (Oncology)   Annia Belt, MD 11/8/201611:15 AM

## 2015-11-04 ENCOUNTER — Telehealth: Payer: Self-pay | Admitting: *Deleted

## 2015-11-04 NOTE — Telephone Encounter (Signed)
Call from pt requesting phlebotomy appt changed from 12/6 to the following week. Talked to [facility] in Short Stay - appt changed to 12/12 @ 1000 AM. Pt called/informed.

## 2015-11-21 ENCOUNTER — Other Ambulatory Visit: Payer: Self-pay | Admitting: Cardiovascular Disease

## 2015-11-26 ENCOUNTER — Encounter (HOSPITAL_COMMUNITY): Payer: Medicare Other

## 2015-12-02 ENCOUNTER — Ambulatory Visit (HOSPITAL_COMMUNITY)
Admission: RE | Admit: 2015-12-02 | Discharge: 2015-12-02 | Disposition: A | Discharge status: Home / Self Care | Payer: Medicare Other | Source: Ambulatory Visit | Attending: Oncology | Admitting: Oncology

## 2015-12-02 DIAGNOSIS — D45 Polycythemia vera: Secondary | ICD-10-CM | POA: Diagnosis not present

## 2015-12-02 LAB — CBC WITH DIFFERENTIAL/PLATELET
BASOS ABS: 0.2 10*3/uL — AB (ref 0.0–0.1)
Basophils Relative: 1 %
EOS ABS: 0.5 10*3/uL (ref 0.0–0.7)
EOS PCT: 3 %
HCT: 41.4 % (ref 36.0–46.0)
Hemoglobin: 12.3 g/dL (ref 12.0–15.0)
Lymphocytes Relative: 8 %
Lymphs Abs: 1.6 10*3/uL (ref 0.7–4.0)
MCH: 22.3 pg — ABNORMAL LOW (ref 26.0–34.0)
MCHC: 29.7 g/dL — ABNORMAL LOW (ref 30.0–36.0)
MCV: 75.1 fL — ABNORMAL LOW (ref 78.0–100.0)
MONO ABS: 0.5 10*3/uL (ref 0.1–1.0)
Monocytes Relative: 3 %
Neutro Abs: 16.6 10*3/uL — ABNORMAL HIGH (ref 1.7–7.7)
Neutrophils Relative %: 85 %
PLATELETS: 307 10*3/uL (ref 150–400)
RBC: 5.51 MIL/uL — AB (ref 3.87–5.11)
RDW: 17.7 % — AB (ref 11.5–15.5)
WBC: 19.5 10*3/uL — AB (ref 4.0–10.5)

## 2015-12-02 LAB — COMPREHENSIVE METABOLIC PANEL
ALT: 18 U/L (ref 14–54)
AST: 22 U/L (ref 15–41)
Albumin: 3.9 g/dL (ref 3.5–5.0)
Alkaline Phosphatase: 68 U/L (ref 38–126)
Anion gap: 9 (ref 5–15)
BUN: 20 mg/dL (ref 6–20)
CHLORIDE: 104 mmol/L (ref 101–111)
CO2: 26 mmol/L (ref 22–32)
CREATININE: 0.94 mg/dL (ref 0.44–1.00)
Calcium: 9 mg/dL (ref 8.9–10.3)
GFR calc non Af Amer: >60 mL/min (ref >60)
Glucose, Bld: 69 mg/dL (ref 65–99)
POTASSIUM: 4.5 mmol/L (ref 3.5–5.1)
SODIUM: 139 mmol/L (ref 135–145)
Total Bilirubin: 0.4 mg/dL (ref 0.3–1.2)
Total Protein: 7 g/dL (ref 6.5–8.1)

## 2015-12-02 LAB — POCT HEMOGLOBIN-HEMACUE: HEMOGLOBIN: 12.3 g/dL (ref 12.0–15.0)

## 2015-12-02 LAB — FERRITIN: FERRITIN: 16 ng/mL (ref 11–307)

## 2015-12-02 NOTE — Progress Notes (Signed)
HGB 12.3 via hemocue.  Therapeutic phlebotomy performed as ordered.  Approx 320 cc blood removed prior to pt. c/o feeling like she was going to pass out. Pt diaphoretic.  Phlebotomy stopped & chaired reclined.  HR regular at 64.  PO's offered.  Will monitor prior to d/c.  Friend in room, call bell in reach, instructed to call if needed.

## 2015-12-04 ENCOUNTER — Telehealth: Payer: Self-pay | Admitting: *Deleted

## 2015-12-04 NOTE — Telephone Encounter (Signed)
-----   Message from Annia Belt, MD sent at 12/02/2015  6:23 PM EST ----- Call pt: lab good

## 2015-12-04 NOTE — Telephone Encounter (Signed)
Pt called / informed labs are good per Dr Beryle Beams. Pt voiced no questions; stated she feels good.

## 2015-12-20 ENCOUNTER — Other Ambulatory Visit: Payer: Self-pay | Admitting: Cardiovascular Disease

## 2015-12-20 DIAGNOSIS — I739 Peripheral vascular disease, unspecified: Secondary | ICD-10-CM

## 2016-01-03 ENCOUNTER — Ambulatory Visit (HOSPITAL_COMMUNITY)
Admission: RE | Admit: 2016-01-03 | Discharge: 2016-01-03 | Disposition: A | Discharge status: Home / Self Care | Payer: Medicare Other | Source: Ambulatory Visit | Attending: Cardiology | Admitting: Cardiology

## 2016-01-03 DIAGNOSIS — I739 Peripheral vascular disease, unspecified: Secondary | ICD-10-CM | POA: Insufficient documentation

## 2016-01-03 DIAGNOSIS — E785 Hyperlipidemia, unspecified: Secondary | ICD-10-CM | POA: Insufficient documentation

## 2016-01-03 DIAGNOSIS — R938 Abnormal findings on diagnostic imaging of other specified body structures: Secondary | ICD-10-CM | POA: Diagnosis not present

## 2016-01-03 DIAGNOSIS — I10 Essential (primary) hypertension: Secondary | ICD-10-CM | POA: Diagnosis not present

## 2016-01-08 ENCOUNTER — Encounter: Payer: Self-pay | Admitting: Cardiovascular Disease

## 2016-01-08 ENCOUNTER — Ambulatory Visit (INDEPENDENT_AMBULATORY_CARE_PROVIDER_SITE_OTHER): Payer: Medicare Other | Admitting: Cardiovascular Disease

## 2016-01-08 VITALS — BP 178/82 | HR 76 | Ht 65.0 in | Wt 148.2 lb

## 2016-01-08 DIAGNOSIS — I6522 Occlusion and stenosis of left carotid artery: Secondary | ICD-10-CM

## 2016-01-08 DIAGNOSIS — R600 Localized edema: Secondary | ICD-10-CM

## 2016-01-08 DIAGNOSIS — I169 Hypertensive crisis, unspecified: Secondary | ICD-10-CM

## 2016-01-08 DIAGNOSIS — I10 Essential (primary) hypertension: Secondary | ICD-10-CM | POA: Diagnosis not present

## 2016-01-08 DIAGNOSIS — I2583 Coronary atherosclerosis due to lipid rich plaque: Secondary | ICD-10-CM

## 2016-01-08 DIAGNOSIS — R609 Edema, unspecified: Secondary | ICD-10-CM | POA: Diagnosis not present

## 2016-01-08 DIAGNOSIS — I2510 Atherosclerotic heart disease of native coronary artery without angina pectoris: Secondary | ICD-10-CM

## 2016-01-08 DIAGNOSIS — I739 Peripheral vascular disease, unspecified: Secondary | ICD-10-CM

## 2016-01-08 DIAGNOSIS — E785 Hyperlipidemia, unspecified: Secondary | ICD-10-CM

## 2016-01-08 HISTORY — DX: Hypertensive crisis, unspecified: I16.9

## 2016-01-08 MED ORDER — LISINOPRIL 5 MG PO TABS: 5.0000 mg | ORAL_TABLET | Freq: Every day | ORAL | Status: DC

## 2016-01-08 NOTE — Assessment & Plan Note (Signed)
History of PAF maintaining sinus rhythm on flecainide and Eliquis oral anti-coagulation

## 2016-01-08 NOTE — Assessment & Plan Note (Signed)
History of peripheral arterial disease status post left external iliac artery stenting the right SFA stenting by myself August 2007 with a known occluded left SFA. Because of recurrent claudication Dopplers were obtained which revealed a decrease in her right ABI from 1.5 with what appear to be an occluded right SFA stent. I intubated her 06/27/15 confirming this recanalized her right SFA placing 2 overlapping via Bahn covered stents with excellent angiographic and clinical result. Her claudication resolved her Dopplers normalized. Her most recent Dopplers performed 01/03/16 revealed a right ABI 0.92 with widely patent SFA stents.

## 2016-01-08 NOTE — Assessment & Plan Note (Signed)
History of hypertension blood pressure measured today at 178/82. She says this is a typical blood pressure when she measures it at home as well. She is on diltiazem. Her renal function is normal. I'm going to begin her on lisinopril 5 mg a day, and we'll check a basic metabolic panel in 3 weeks. I've instructed her to keep a daily blood pressure log. She will see Erasmo Downer back in one month for further evaluation and titration of antihypertensive medications.

## 2016-01-08 NOTE — Assessment & Plan Note (Signed)
>>  ASSESSMENT AND PLAN FOR MIXED HYPERLIPIDEMIA WRITTEN ON 01/08/2016 11:14 AM BY BERRY, JONATHAN J, MD  History of hyperlipidemia on Zetia  and pravastatin  most recent lipid profile performed 06/25/15 revealing total cholesterol 56, LDL 72 a still a 36

## 2016-01-08 NOTE — Patient Instructions (Signed)
Medication Instructions:  Your physician has recommended you make the following change in your medication:  1) START Lisinopril 5 mg tablet by mouth ONCE daily   Labwork: Your physician recommends that you return for lab work in: BMET - 3 weeks  The lab can be found on the FIRST FLOOR of out building in [address]   Testing/Procedures: Your physician has requested that you have a carotid duplex. This test is an ultrasound of the carotid arteries in your neck. It looks at blood flow through these arteries that supply the brain with blood. Allow one hour for this exam. There are no restrictions or special instructions. AUGUST 2017  Follow-Up: Your physician recommends that you schedule a follow-up appointment in: Blood Pressure [facility] with Erasmo Downer in - 1 month  Your physician wants you to follow-up in: 12 months with Dr. [name]. You will receive a reminder letter in the mail two months in advance. If you don't receive a letter, please call our office to schedule the follow-up appointment.    Any Other Special Instructions Will Be Listed Below (If Applicable).     If you need a refill on your cardiac medications before your next appointment, please call your pharmacy.

## 2016-01-08 NOTE — Assessment & Plan Note (Signed)
History of hyperlipidemia on Zetia and pravastatin most recent lipid profile performed 06/25/15 revealing total cholesterol 56, LDL 72 a still a 36

## 2016-01-08 NOTE — Progress Notes (Signed)
01/08/2016 [name]   01-Jan-1951  [id number]  Primary Physician Stacey Graves, DO Primary Cardiologist: Stacey Harp MD [name]   HPI:  The patient is a 65 year old, thin-appearing, widowed Caucasian female, mother of 2 children, grandmother to 5 grandchildren and 3 step-grandchildren who she has raised. I last saw her in the office 07/11/15... She has a history of PVOD status post left external iliac artery PTA and stenting as well as right SFA stenting by myself August 2007 with a known occluded left SFA. Her other problems include recently discontinued tobacco abuse November 11, 2012, hypertension and hyperlipidemia. She is statin intolerant. She has had PAF in the past and has been on Coumadin anticoagulation which was stopped because of nosebleeds and changed to Eliquis . She really denies chest pain, shortness of breath, or claudication. Her last Myoview performed December 27, 2009, was nonischemic. Dopplers performed a year ago showed patent stents with right ABI of 0.87 and a left of 0.65 .She had cardiac catheterization performed by Dr. Ellyn [name] revealed noncritical CAD.since I saw her back in September of last year she developed recurrent right calf claudication beginning in November or December of last year. Most recent Doppler show A decline in her ABI from 0.1-0.5 with an occluded right SFA stent suggesting in-stent restenosis. I performed angiography on her 06/27/15 revealing an occluded right SFA stent which I recanalized and restented using overlapping Viabahn covered stents. Her ABI increased from 0.5 up to 0.8 and a claudication resolved.since I saw her back 6 months ago she continues to be free of claudication. She denies chest pain or shortness of breath. She did have carotid Dopplers performed that showed moderate left ICA stenosis.   Current Outpatient Prescriptions  Medication Sig Dispense Refill  . acetaminophen (TYLENOL) 500 MG tablet Take 500 mg by  mouth every 6 (six) hours as needed for mild pain.     Marland Kitchen aspirin EC 81 MG EC tablet Take 1 tablet (81 mg total) by mouth daily.    Marland Kitchen diltiazem (CARDIZEM CD) 180 MG 24 hr capsule Take 180 mg by mouth every morning.    . docusate sodium (COLACE) 100 MG capsule Take 200 mg by mouth daily.     Marland Kitchen ELIQUIS 5 MG TABS tablet TAKE ONE TABLET BY MOUTH TWICE A DAY AS DIRECTED 60 tablet 4  . fenofibrate (TRICOR) 145 MG tablet TAKE ONE TABLET BY MOUTH AT BEDTIME 30 tablet 11  . flecainide (TAMBOCOR) 50 MG tablet TAKE ONE TABLET BY MOUTH TWICE DAILY 60 tablet 11  . hydrochlorothiazide (MICROZIDE) 12.5 MG capsule Take 1 capsule (12.5 mg total) by mouth daily. 30 capsule 11  . omeprazole (PRILOSEC) 20 MG capsule Take 20 mg by mouth 2 (two) times daily as needed (acid reflux).     [name] Glycol-Propyl Glycol (SYSTANE OP) Place 1 drop into both eyes 4 (four) times daily as needed (dryness).    . polyethylene glycol (MIRALAX / GLYCOLAX) packet Take 17 g by mouth daily as needed (constipation).    . pravastatin (PRAVACHOL) 40 MG tablet TAKE 1 TABLET (40 MG TOTAL) BY MOUTH EVERY EVENING. 30 tablet 10  . traMADol (ULTRAM) 50 MG tablet Take 50 mg by mouth 3 (three) times daily as needed (pain).     Marland Kitchen ZETIA 10 MG tablet TAKE ONE TABLET BY MOUTH AT BEDTIME 30 tablet 11  . lisinopril (PRINIVIL,ZESTRIL) 5 MG tablet Take 1 tablet (5 mg total) by mouth daily. 90 tablet 3  No current facility-administered medications for this visit.    Allergies  Allergen Reactions  . Crestor [Rosuvastatin] Other (See Comments)    Itchy rash  . Demerol [Meperidine] Other (See Comments)    Unknown, can't remember   . Effexor [Venlafaxine Hcl]     Doesn't remember   . Inderal [Propranolol]     Doesn't remember   . Lovaza [Omega-3-Acid Ethyl Esters]     Doesn't remember   . Penicillins Other (See Comments)    Questionable high fever when taken and broke out in whelps.   [name] [Oxycodone-Acetaminophen]     Doesn't remember    . Pravastatin Itching  . Warfarin And Related Other (See Comments)    Caused nose bleeds  . Atenolol Rash  . Lipitor [Atorvastatin] Rash  . Nexium [Esomeprazole Magnesium] Rash  . Plavix [Clopidogrel Bisulfate] Rash  . Simvastatin Itching and Rash    Also lipitor intolerant  . Trilipix [Choline Fenofibrate] Other (See Comments)    Doesn't remember     Social History   Social History  . Marital Status: Widowed    Spouse Name: N/A  . Number of Children: 2  . Years of Education: 12   Occupational History  . CNA    Social History Main Topics  . Smoking status: Former Smoker -- 1.00 packs/day for 45 years    Types: Cigarettes    Quit date: 11/11/2012  . Smokeless tobacco: Never Used     Comment: quit x 67yrs.  . Alcohol Use: No  . Drug Use: No  . Sexual Activity: Not on file   Other Topics Concern  . Not on file   Social History Narrative     Review of Systems: General: negative for chills, fever, night sweats or weight changes.  Cardiovascular: negative for chest pain, dyspnea on exertion, edema, orthopnea, palpitations, paroxysmal nocturnal dyspnea or shortness of breath Dermatological: negative for rash Respiratory: negative for cough or wheezing Urologic: negative for hematuria Abdominal: negative for nausea, vomiting, diarrhea, bright red blood per rectum, melena, or hematemesis Neurologic: negative for visual changes, syncope, or dizziness All other systems reviewed and are otherwise negative except as noted above.    Blood pressure 178/82, pulse 76, height 5\' 5"  (1.651 m), weight 148 lb 3.2 oz (67.223 kg).  General appearance: alert and no distress Neck: no adenopathy, no JVD, supple, symmetrical, trachea midline, thyroid not enlarged, symmetric, no tenderness/mass/nodules and soft bilateral carotid bruits Lungs: clear to auscultation bilaterally Heart: regular rate and rhythm, S1, S2 normal, no murmur, click, rub or gallop Extremities: extremities normal,  atraumatic, no cyanosis or edema  EKG not performed today  ASSESSMENT AND PLAN:   PAD (peripheral artery disease), Lt carotid 50-70%: , Hx stent Lt exteral iliac & RSFA stent, known occl. LSFA  History of peripheral arterial disease status post left external iliac artery stenting the right SFA stenting by myself August 2007 with a known occluded left SFA. Because of recurrent claudication Dopplers were obtained which revealed a decrease in her right ABI from 1.5 with what appear to be an occluded right SFA stent. I intubated her 06/27/15 confirming this recanalized her right SFA placing 2 overlapping via Bahn covered stents with excellent angiographic and clinical result. Her claudication resolved her Dopplers normalized. Her most recent Dopplers performed 01/03/16 revealed a right ABI 0.92 with widely patent SFA stents.  Hyperlipidemia History of hyperlipidemia on Zetia and pravastatin most recent lipid profile performed 06/25/15 revealing total cholesterol 56, LDL 72 a still a  47  CAD (coronary artery disease), per cath 04/27/13, non obstructive disease 20-30% LM; LAD 30%; 50-60% OM2; RCA 40%; EF 65-70% History of noncritical CAD by cath performed by Dr. Ellyn [name] May 2014. She denies chest pain or shortness of breath  Atrial fibrillation with RVR, hx of PAF previously History of PAF maintaining sinus rhythm on flecainide and Eliquis oral anti-coagulation  Carotid artery disease History of carotid artery disease with recent Doppler performed 07/30/15 revealing moderately severe left ICA stenosis which has remained stable. She is neurologically asymptomatic. We will recheck Dopplers in August of this year  Essential hypertension History of hypertension blood pressure measured today at 178/82. She says this is a typical blood pressure when she measures it at home as well. She is on diltiazem. Her renal function is normal. I'm going to begin her on lisinopril 5 mg a day, and we'll check a basic metabolic  panel in 3 weeks. I've instructed her to keep a daily blood pressure log. She will see Erasmo Downer back in one month for further evaluation and titration of antihypertensive medications.      Stacey Harp MD FACP,FACC,FAHA, [facility] 01/08/2016 11:17 AM

## 2016-01-08 NOTE — Assessment & Plan Note (Signed)
History of carotid artery disease with recent Doppler performed 07/30/15 revealing moderately severe left ICA stenosis which has remained stable. She is neurologically asymptomatic. We will recheck Dopplers in August of this year

## 2016-01-08 NOTE — Assessment & Plan Note (Signed)
History of noncritical CAD by cath performed by Dr. Ellyn Hack May 2014. She denies chest pain or shortness of breath

## 2016-01-27 ENCOUNTER — Other Ambulatory Visit (HOSPITAL_COMMUNITY): Payer: Self-pay | Admitting: *Deleted

## 2016-01-27 ENCOUNTER — Telehealth: Payer: Self-pay | Admitting: Cardiovascular Disease

## 2016-01-27 ENCOUNTER — Other Ambulatory Visit: Payer: Self-pay | Admitting: Oncology

## 2016-01-27 NOTE — Telephone Encounter (Signed)
New Message  Pt c/o Numbness: STAT is pt has developed SOB within 24 hours  1. How long have you been experiencing numbness? Within the last 48 Hours   2. Where is the numbness located? Right foot from the knee down but mostly in the foot. (Sit is now slightly blue in color )  4.  Are you currently experiencing other symtoms? Pain from the knee down. Feels like the muscle has been twisted.   5.  Have you traveled recently? No

## 2016-01-27 NOTE — Telephone Encounter (Signed)
Pt called back - was amenable to APP visit for tomorrow. Added to Rhonda's schedule to be seen.

## 2016-01-27 NOTE — Telephone Encounter (Signed)
Returned call. Pt identifies pain in right leg which started yesterday morning. Cites numbness but also general soreness. She has history of stenting in this leg. Notes she has had pain in times in this leg in the past, however, states "worse than it has ever been".  Notes no missed doses of meds. No unusual activity or injury.  Pt requesting evaluation or advice. She does not think she can wait until next scheduled appt to address.  From last office visit: " PAD (peripheral artery disease), Lt carotid 50-70%: , Hx stent Lt exteral iliac & RSFA stent, known occl. LSFA - Lorretta Harp, MD at 01/08/2016 11:10 AM     Status: Written Related Problem: PAD (peripheral artery disease), Lt carotid 50-70%: , Hx stent Lt exteral iliac & RSFA stent, known occl. LSFA    Expand All Collapse All   History of peripheral arterial disease status post left external iliac artery stenting the right SFA stenting by myself August 2007 with a known occluded left SFA. Because of recurrent claudication Dopplers were obtained which revealed a decrease in her right ABI from 1.5 with what appear to be an occluded right SFA stent. I intubated her 06/27/15 confirming this recanalized her right SFA placing 2 overlapping via Bahn covered stents with excellent angiographic and clinical result. Her claudication resolved her Dopplers normalized. Her most recent Dopplers performed 01/03/16 revealed a right ABI 0.92 with widely patent SFA stents.

## 2016-01-28 ENCOUNTER — Ambulatory Visit (INDEPENDENT_AMBULATORY_CARE_PROVIDER_SITE_OTHER): Payer: Medicare Other | Admitting: Physician Assistant

## 2016-01-28 ENCOUNTER — Ambulatory Visit (HOSPITAL_COMMUNITY)
Admission: RE | Admit: 2016-01-28 | Discharge: 2016-01-28 | Disposition: A | Discharge status: Home / Self Care | Payer: Medicare Other | Source: Ambulatory Visit | Attending: Oncology | Admitting: Oncology

## 2016-01-28 ENCOUNTER — Other Ambulatory Visit: Payer: Self-pay | Admitting: Cardiovascular Disease

## 2016-01-28 ENCOUNTER — Encounter: Payer: Self-pay | Admitting: Physician Assistant

## 2016-01-28 ENCOUNTER — Ambulatory Visit (HOSPITAL_COMMUNITY)
Admission: RE | Admit: 2016-01-28 | Discharge: 2016-01-28 | Disposition: A | Discharge status: Home / Self Care | Payer: Medicare Other | Source: Ambulatory Visit | Attending: Cardiology | Admitting: Cardiology

## 2016-01-28 ENCOUNTER — Inpatient Hospital Stay (HOSPITAL_COMMUNITY)
Admission: AD | Admit: 2016-01-28 | Discharge: 2016-02-07 | DRG: 253 | Disposition: A | Discharge status: Home Health | Payer: Medicare Other | Source: Ambulatory Visit | Attending: Cardiovascular Disease | Admitting: Cardiovascular Disease

## 2016-01-28 ENCOUNTER — Encounter (HOSPITAL_COMMUNITY): Payer: Self-pay | Admitting: General Practice

## 2016-01-28 VITALS — BP 160/70 | HR 80 | Ht 65.5 in | Wt 146.0 lb

## 2016-01-28 DIAGNOSIS — Z888 Allergy status to other drugs, medicaments and biological substances status: Secondary | ICD-10-CM | POA: Diagnosis not present

## 2016-01-28 DIAGNOSIS — K219 Gastro-esophageal reflux disease without esophagitis: Secondary | ICD-10-CM | POA: Diagnosis present

## 2016-01-28 DIAGNOSIS — Z8673 Personal history of transient ischemic attack (TIA), and cerebral infarction without residual deficits: Secondary | ICD-10-CM

## 2016-01-28 DIAGNOSIS — D45 Polycythemia vera: Secondary | ICD-10-CM | POA: Diagnosis present

## 2016-01-28 DIAGNOSIS — E785 Hyperlipidemia, unspecified: Secondary | ICD-10-CM | POA: Diagnosis present

## 2016-01-28 DIAGNOSIS — I2510 Atherosclerotic heart disease of native coronary artery without angina pectoris: Secondary | ICD-10-CM | POA: Diagnosis present

## 2016-01-28 DIAGNOSIS — I739 Peripheral vascular disease, unspecified: Secondary | ICD-10-CM | POA: Diagnosis not present

## 2016-01-28 DIAGNOSIS — I10 Essential (primary) hypertension: Secondary | ICD-10-CM | POA: Diagnosis present

## 2016-01-28 DIAGNOSIS — Z9841 Cataract extraction status, right eye: Secondary | ICD-10-CM | POA: Diagnosis not present

## 2016-01-28 DIAGNOSIS — Y838 Other surgical procedures as the cause of abnormal reaction of the patient, or of later complication, without mention of misadventure at the time of the procedure: Secondary | ICD-10-CM | POA: Diagnosis not present

## 2016-01-28 DIAGNOSIS — D62 Acute posthemorrhagic anemia: Secondary | ICD-10-CM | POA: Diagnosis not present

## 2016-01-28 DIAGNOSIS — I998 Other disorder of circulatory system: Secondary | ICD-10-CM | POA: Diagnosis not present

## 2016-01-28 DIAGNOSIS — R040 Epistaxis: Secondary | ICD-10-CM | POA: Diagnosis not present

## 2016-01-28 DIAGNOSIS — Z419 Encounter for procedure for purposes other than remedying health state, unspecified: Secondary | ICD-10-CM

## 2016-01-28 DIAGNOSIS — I70201 Unspecified atherosclerosis of native arteries of extremities, right leg: Principal | ICD-10-CM | POA: Diagnosis present

## 2016-01-28 DIAGNOSIS — Z79899 Other long term (current) drug therapy: Secondary | ICD-10-CM | POA: Diagnosis not present

## 2016-01-28 DIAGNOSIS — M1990 Unspecified osteoarthritis, unspecified site: Secondary | ICD-10-CM | POA: Diagnosis present

## 2016-01-28 DIAGNOSIS — T82858A Stenosis of vascular prosthetic devices, implants and grafts, initial encounter: Secondary | ICD-10-CM | POA: Diagnosis not present

## 2016-01-28 DIAGNOSIS — Z8249 Family history of ischemic heart disease and other diseases of the circulatory system: Secondary | ICD-10-CM

## 2016-01-28 DIAGNOSIS — D72829 Elevated white blood cell count, unspecified: Secondary | ICD-10-CM | POA: Diagnosis not present

## 2016-01-28 DIAGNOSIS — Z9842 Cataract extraction status, left eye: Secondary | ICD-10-CM | POA: Diagnosis not present

## 2016-01-28 DIAGNOSIS — I480 Paroxysmal atrial fibrillation: Secondary | ICD-10-CM | POA: Diagnosis present

## 2016-01-28 DIAGNOSIS — H409 Unspecified glaucoma: Secondary | ICD-10-CM | POA: Diagnosis present

## 2016-01-28 DIAGNOSIS — Z87891 Personal history of nicotine dependence: Secondary | ICD-10-CM

## 2016-01-28 DIAGNOSIS — I70211 Atherosclerosis of native arteries of extremities with intermittent claudication, right leg: Secondary | ICD-10-CM | POA: Diagnosis not present

## 2016-01-28 DIAGNOSIS — Z7982 Long term (current) use of aspirin: Secondary | ICD-10-CM

## 2016-01-28 DIAGNOSIS — T8089XA Other complications following infusion, transfusion and therapeutic injection, initial encounter: Secondary | ICD-10-CM | POA: Diagnosis not present

## 2016-01-28 DIAGNOSIS — E782 Mixed hyperlipidemia: Secondary | ICD-10-CM | POA: Diagnosis present

## 2016-01-28 DIAGNOSIS — R509 Fever, unspecified: Secondary | ICD-10-CM | POA: Diagnosis not present

## 2016-01-28 DIAGNOSIS — T82856A Stenosis of peripheral vascular stent, initial encounter: Secondary | ICD-10-CM | POA: Diagnosis not present

## 2016-01-28 DIAGNOSIS — Z961 Presence of intraocular lens: Secondary | ICD-10-CM | POA: Diagnosis present

## 2016-01-28 DIAGNOSIS — Z7901 Long term (current) use of anticoagulants: Secondary | ICD-10-CM | POA: Diagnosis not present

## 2016-01-28 DIAGNOSIS — I252 Old myocardial infarction: Secondary | ICD-10-CM

## 2016-01-28 DIAGNOSIS — I340 Nonrheumatic mitral (valve) insufficiency: Secondary | ICD-10-CM | POA: Diagnosis present

## 2016-01-28 DIAGNOSIS — M79604 Pain in right leg: Secondary | ICD-10-CM | POA: Diagnosis present

## 2016-01-28 HISTORY — DX: Headache, unspecified: R51.9

## 2016-01-28 HISTORY — DX: Headache: R51

## 2016-01-28 HISTORY — DX: Migraine, unspecified, not intractable, without status migrainosus: G43.909

## 2016-01-28 LAB — PROTIME-INR
INR: 1.25 (ref 0.00–1.49)
Prothrombin Time: 15.9 seconds — ABNORMAL HIGH (ref 11.6–15.2)

## 2016-01-28 LAB — BASIC METABOLIC PANEL
Anion gap: 10 (ref 5–15)
BUN: 20 mg/dL (ref 6–20)
CO2: 25 mmol/L (ref 22–32)
Calcium: 9.6 mg/dL (ref 8.9–10.3)
Chloride: 103 mmol/L (ref 101–111)
Creatinine, Ser: 0.94 mg/dL (ref 0.44–1.00)
Glucose, Bld: 85 mg/dL (ref 65–99)
POTASSIUM: 4.4 mmol/L (ref 3.5–5.1)
SODIUM: 138 mmol/L (ref 135–145)

## 2016-01-28 LAB — HEPARIN LEVEL (UNFRACTIONATED): Heparin Unfractionated: 2.2 IU/mL — ABNORMAL HIGH (ref 0.30–0.70)

## 2016-01-28 LAB — POCT HEMOGLOBIN-HEMACUE: HEMOGLOBIN: 10.9 g/dL — AB (ref 12.0–15.0)

## 2016-01-28 LAB — APTT: APTT: 53 s — AB (ref 24–37)

## 2016-01-28 MED ORDER — SODIUM CHLORIDE 0.9% FLUSH
3.0000 mL | Freq: Two times a day (BID) | INTRAVENOUS | Status: DC
  Administered 2016-01-29 – 2016-02-06 (×12): 3 mL via INTRAVENOUS

## 2016-01-28 MED ORDER — ALPRAZOLAM 0.25 MG PO TABS
0.2500 mg | ORAL_TABLET | Freq: Two times a day (BID) | ORAL | Status: DC | PRN
  Filled 2016-01-28: qty 1

## 2016-01-28 MED ORDER — TRAMADOL HCL 50 MG PO TABS
50.0000 mg | ORAL_TABLET | Freq: Three times a day (TID) | ORAL | Status: DC | PRN
Start: 2016-01-28 — End: 2016-02-07
  Administered 2016-01-29 – 2016-02-04 (×5): 50 mg via ORAL
  Filled 2016-01-28 (×5): qty 1

## 2016-01-28 MED ORDER — ASPIRIN 81 MG PO CHEW
81.0000 mg | CHEWABLE_TABLET | ORAL | Status: AC
  Administered 2016-01-29: 81 mg via ORAL
  Filled 2016-01-28 (×2): qty 1

## 2016-01-28 MED ORDER — DOCUSATE SODIUM 100 MG PO CAPS
200.0000 mg | ORAL_CAPSULE | Freq: Every day | ORAL | Status: DC
  Administered 2016-01-28 – 2016-02-04 (×5): 200 mg via ORAL
  Filled 2016-01-28 (×10): qty 2

## 2016-01-28 MED ORDER — LIDOCAINE HCL 2 % IJ SOLN: 0.1000 mL | Freq: Once | INTRAMUSCULAR | Status: DC

## 2016-01-28 MED ORDER — HEPARIN (PORCINE) IN NACL 100-0.45 UNIT/ML-% IJ SOLN
1000.0000 [IU]/h | INTRAMUSCULAR | Status: DC
  Administered 2016-01-28: 900 [IU]/h via INTRAVENOUS
  Filled 2016-01-28: qty 250

## 2016-01-28 MED ORDER — SODIUM CHLORIDE 0.9 % IV SOLN: 250.0000 mL | INTRAVENOUS | Status: DC | PRN

## 2016-01-28 MED ORDER — LISINOPRIL 5 MG PO TABS
5.0000 mg | ORAL_TABLET | Freq: Every day | ORAL | Status: DC
  Administered 2016-01-28 – 2016-02-02 (×5): 5 mg via ORAL
  Filled 2016-01-28 (×6): qty 1

## 2016-01-28 MED ORDER — SODIUM CHLORIDE 0.9% FLUSH
3.0000 mL | Freq: Two times a day (BID) | INTRAVENOUS | Status: DC
  Administered 2016-01-29: 3 mL via INTRAVENOUS

## 2016-01-28 MED ORDER — SODIUM CHLORIDE 0.9 % IV SOLN
250.0000 mL | INTRAVENOUS | Status: DC | PRN
Start: 2016-01-28 — End: 2016-02-07

## 2016-01-28 MED ORDER — ASPIRIN EC 81 MG PO TBEC
81.0000 mg | DELAYED_RELEASE_TABLET | Freq: Every day | ORAL | Status: DC
  Administered 2016-01-30: 81 mg via ORAL
  Filled 2016-01-28 (×2): qty 1

## 2016-01-28 MED ORDER — ACETAMINOPHEN 325 MG PO TABS
650.0000 mg | ORAL_TABLET | ORAL | Status: DC | PRN
  Administered 2016-01-28 – 2016-02-06 (×5): 650 mg via ORAL
  Filled 2016-01-28 (×6): qty 2

## 2016-01-28 MED ORDER — SODIUM CHLORIDE 0.9 % IV SOLN
INTRAVENOUS | Status: DC
  Administered 2016-01-28: 19:00:00 via INTRAVENOUS

## 2016-01-28 MED ORDER — ONDANSETRON HCL 4 MG/2ML IJ SOLN
4.0000 mg | Freq: Four times a day (QID) | INTRAMUSCULAR | Status: DC | PRN
  Administered 2016-01-31 – 2016-02-02 (×2): 4 mg via INTRAVENOUS
  Filled 2016-01-28 (×4): qty 2

## 2016-01-28 MED ORDER — ZOLPIDEM TARTRATE 5 MG PO TABS: 5.0000 mg | ORAL_TABLET | Freq: Every evening | ORAL | Status: DC | PRN

## 2016-01-28 MED ORDER — SODIUM CHLORIDE 0.9% FLUSH: 3.0000 mL | INTRAVENOUS | Status: DC | PRN

## 2016-01-28 MED ORDER — NITROGLYCERIN 0.4 MG SL SUBL: 0.4000 mg | SUBLINGUAL_TABLET | SUBLINGUAL | Status: DC | PRN

## 2016-01-28 MED ORDER — POLYVINYL ALCOHOL 1.4 % OP SOLN: 1.0000 [drp] | OPHTHALMIC | Status: DC | PRN

## 2016-01-28 MED ORDER — DILTIAZEM HCL ER COATED BEADS 180 MG PO CP24
180.0000 mg | ORAL_CAPSULE | Freq: Every morning | ORAL | Status: DC
  Administered 2016-01-29 – 2016-02-07 (×9): 180 mg via ORAL
  Filled 2016-01-28 (×9): qty 1

## 2016-01-28 MED ORDER — EZETIMIBE 10 MG PO TABS
10.0000 mg | ORAL_TABLET | Freq: Every day | ORAL | Status: DC
  Administered 2016-01-28 – 2016-02-06 (×9): 10 mg via ORAL
  Filled 2016-01-28 (×9): qty 1

## 2016-01-28 MED ORDER — FLECAINIDE ACETATE 50 MG PO TABS
50.0000 mg | ORAL_TABLET | Freq: Two times a day (BID) | ORAL | Status: DC
  Administered 2016-01-28 – 2016-02-07 (×18): 50 mg via ORAL
  Filled 2016-01-28 (×20): qty 1

## 2016-01-28 NOTE — H&P (Signed)
Cardiology Office Note   Date: 01/28/2016   ID: [name], DOB [birth year], MRN [id number]  PCP: Stacey Graves, DO Cardiologist: Dr Stacy Gardner, PA-C   Chief complaint: Claudication symptoms  History of Present Illness: [name] is a 65 y.o. female with a history of PAD, L-ICA 50-70%, Hx stents L-EIA & R-SFA 2007, L-SFA 100% w/ R-SFA restent 06/2015, ABIs L 0.74, R 0.92 01/03/2016. Hx PAF, polycythemia vera, CVA, on Eliquis for anticoagulation.  [name] presents for evaluation of right lower extremity pain.  [name] had been doing very well. She was reasonably active. She was compliant with her medications. She is compliant with appointments for phlebotomy, the most recent one was today, 01/28/2016.  On Sunday morning, she had sudden onset of right lower extremity pain and numbness. The pain is mainly in her calf. She has some pain in her right foot. The numbness is in her right foot, especially on the top. She noted that her right toes were cold. She could barely walk because of the pain. She was compliant with her medications, but the symptoms did not improve.  She is aware that her blood pressure has been running high, and she has been compliant with her medications. She quit smoking several years ago and confirms that she has not started back.  She is concerned that something has happened to the last stent that Dr [name] put in.    Past Medical History  Diagnosis Date  . Hypertension   . PVD (peripheral vascular disease) with claudication ([place]) 07/2006    previous L ext iliac stent and rt SFA stent, known occluded Lt SFA  . Paroxysmal atrial fibrillation (HCC)     hx of a fib on ASA AND ELIQUIS   . H/O cardiovascular stress test 12/27/2009    negative for ischemia  . NSTEMI (non-ST elevated myocardial infarction) ([place]) 04/27/2013  . S/P cardiac cath 04/27/13    NON OBSTRUCTIVE DISEASE, 2+ mr, ef  65-70%  . CAD (coronary artery disease), per cath 04/27/13, non obstructive disease, EF 65-70% 05/11/2013  . Mitral regurgitation,2+ by cath 05/11/2013  . Statin intolerance   . Carotid disease, bilateral ([place]) 09/2013    Lt carotid 50-69% stentosis, less on rt  . Hyperlipidemia   . Dysrhythmia   . Stroke ([place]) 2006    SWELLING ALL OVER AND RT SIDE OF FACE DRAWN AND SPEECH SLURRED AND NUMBNESS ON RIGHT SIDE-- ALL RESOLVED  . GERD (gastroesophageal reflux disease)   . Pain     BOTH KNEES - PT HAS TORN MENISCUS LEFT KNEE  . Glaucoma   . Arthritis   . Fatty liver   . Leukocytosis 07/15/2015  . Polycythemia vera ([place]) 08/20/2015    JAK-2 positive 07/19/15    Past Surgical History  Procedure Laterality Date  . Ovarian cyst removal Right   . Iliac artery stent  07/2006    external iliac and Rt SFA stent 07/2006 AND THE LEFT WAS IN 2006  . C section 1977    . Orif rt wrist fracture  1983  . Knee arthroscopy with medial menisectomy Left 03/27/2014    Procedure: left knee arthorscopy with medial chondraplasty of the medial femoral and patella, medial microfracture technique of medial femoral condyl; Surgeon: Tobi Bastos, MD; Location: [facility]S; Service: Orthopedics; Laterality: Left;  . Left heart catheterization with coronary angiogram Right 04/27/2013    Procedure: LEFT HEART CATHETERIZATION WITH CORONARY ANGIOGRAM; Surgeon: Leonie Man, MD; Location:  [place] CATH LAB; Service: Cardiovascular; Laterality: Right;  . Cardiac catheterization  04/27/13    non occlusive disease with mild to mod. calcified lesions in ostial LM and proximal LAD and moderate prox. RC AND DISTAL AV GROOVE LCX, ef  . Tubal ligation  1983  . Cataract extraction w/phaco Right 03/21/2015    Procedure: CATARACT EXTRACTION PHACO AND INTRAOCULAR LENS PLACEMENT RIGHT EYE; Surgeon: Baruch Goldmann, MD; Location: [facility]  ORS; Service: Ophthalmology; Laterality: Right; CDE:5.10  . Cataract extraction w/phaco Left 05/16/2015    Procedure: CATARACT EXTRACTION PHACO AND INTRAOCULAR LENS PLACEMENT (IOC); Surgeon: Baruch Goldmann, MD; Location: [facility]; Service: Ophthalmology; Laterality: Left; CDE 5.57  . Sfa Right 06/27/2015    overlapping Bahn covered stents  . Peripheral vascular catheterization N/A 06/27/2015    Procedure: Lower Extremity Angiography; Surgeon: Lorretta Harp, MD; Location: [place] CV LAB; Service: Cardiovascular; Laterality: N/A;    Current Outpatient Prescriptions  Medication Sig Dispense Refill  . acetaminophen (TYLENOL) 500 MG tablet Take 500 mg by mouth every 6 (six) hours as needed for mild pain.     Marland Kitchen aspirin EC 81 MG EC tablet Take 1 tablet (81 mg total) by mouth daily.    Marland Kitchen diltiazem (CARDIZEM CD) 180 MG 24 hr capsule Take 180 mg by mouth every morning.    . docusate sodium (COLACE) 100 MG capsule Take 200 mg by mouth daily.     Marland Kitchen ELIQUIS 5 MG TABS tablet TAKE ONE TABLET BY MOUTH TWICE A DAY AS DIRECTED 60 tablet 4  . fenofibrate (TRICOR) 145 MG tablet TAKE ONE TABLET BY MOUTH AT BEDTIME 30 tablet 11  . flecainide (TAMBOCOR) 50 MG tablet TAKE ONE TABLET BY MOUTH TWICE DAILY 60 tablet 11  . hydrochlorothiazide (MICROZIDE) 12.5 MG capsule Take 1 capsule (12.5 mg total) by mouth daily. 30 capsule 11  . lisinopril (PRINIVIL,ZESTRIL) 5 MG tablet Take 1 tablet (5 mg total) by mouth daily. 90 tablet 3  . omeprazole (PRILOSEC) 20 MG capsule Take 20 mg by mouth 2 (two) times daily as needed (acid reflux).     Vladimir Faster Glycol-Propyl Glycol (SYSTANE OP) Place 1 drop into both eyes 4 (four) times daily as needed (dryness).    . polyethylene glycol (MIRALAX / GLYCOLAX) packet Take 17 g by mouth daily as needed (constipation).    . pravastatin (PRAVACHOL) 40 MG tablet TAKE 1 TABLET (40 MG TOTAL)  BY MOUTH EVERY EVENING. 30 tablet 10  . traMADol (ULTRAM) 50 MG tablet Take 50 mg by mouth 3 (three) times daily as needed (pain).     Marland Kitchen ZETIA 10 MG tablet TAKE ONE TABLET BY MOUTH AT BEDTIME 30 tablet 11   No current facility-administered medications for this visit.   Facility-Administered Medications Ordered in Other Visits  Medication Dose Route Frequency Provider Last Rate Last Dose  . lidocaine (XYLOCAINE) 2 % (with pres) injection 2 mg 0.1 mL Intradermal Once Annia Belt, MD  2 mg at 01/28/16 1104    Allergies: Crestor; Demerol; Effexor; Inderal; Lovaza; Penicillins; Percocet; Pravastatin; Warfarin and related; Atenolol; Lipitor; Nexium; Plavix; Simvastatin; and Trilipix    Social History: The patient  reports that she quit smoking about 3 years ago. Her smoking use included Cigarettes. She has a 45 pack-year smoking history. She has never used smokeless tobacco. She reports that she does not drink alcohol or use illicit drugs.   Family History: The patient's family history includes CAD in her brother, brother, father, and mother; Healthy in her sister and  sister; Heart attack in her mother; Heart attack (age of onset: 67) in her father; Heart disease in her father; Liver cancer in her brother; Lung cancer in her mother.    ROS: Please see the history of present illness. All other systems are reviewed and negative.    PHYSICAL EXAM: VS: BP 160/70 mmHg  Pulse 80  Ht 5' 5.5" (1.664 m)  Wt 146 lb (66.225 kg)  BMI 23.92 kg/m2 , BMI Body mass index is 23.92 kg/(m^2). GEN: Well nourished, well developed, female in no acute distress  HEENT: normal for age  Neck: no JVD, bilateral carotid bruits, no masses, subclavian bruits present as well Cardiac: RRR; soft systolic murmur, no rubs, or gallops Respiratory: clear to auscultation bilaterally, normal work of breathing GI: soft, nontender, nondistended, + BS MS: no deformity or  atrophy; no edema; distal pulses are 2+ in all both upper extremities; left lower extremity is warm to touch but DP and PT pulses are not palpable. Capillary refill is within normal limits. Right lower extremity calf is a little cool and the coolness progresses in the foot with the toes being very cold. There is duskiness and cyanosis to the right foot, especially on the sole. Her right third toe is very cyanotic and capillary refill is very slow.  Skin: warm and dry, no rash Neuro: Strength and sensation are intact Psych: euthymic mood, full affect   EKG: EKG is not ordered today.  Recent Labs: 06/25/2015: TSH 2.113 12/02/2015: ALT 18; BUN 20; Creatinine, Ser 0.94; Hemoglobin 12.3; Platelets 307; Potassium 4.5; Sodium 139    Lipid Panel  Labs (Brief)       Component Value Date/Time   CHOL 156 06/25/2015 0950   TRIG 361* 06/25/2015 0950   HDL 36* 06/25/2015 0950   CHOLHDL 4.3 06/25/2015 0950   VLDL 72* 06/25/2015 0950   LDLCALC 48 06/25/2015 0950      Wt Readings from Last 3 Encounters:  01/28/16 146 lb (66.225 kg)  01/28/16 145 lb (65.772 kg)  01/08/16 148 lb 3.2 oz (67.223 kg)     Other studies Reviewed: Additional studies/ records that were reviewed today include: Office Notes and procedure results.  ASSESSMENT AND PLAN: Dr. [name] will see the patient at [facility],  1. PAD: She appears to have an acute issue with her right lower extremity circulation. The symptoms were very sudden in onset, the foot is cold and there is cyanosis. There is distinctly worse cyanosis in the right third toe. I'm concerned about an embolic event, even though she has been on Eliquis.   Dr. [name] was consulted by phone. He recommends urgent lower extremity Dopplers at the [facility] office and then admission to [facility] for heparinization and determination, once he reviews the Doppler results, of what is the best plan of care.   The patient was asked not to eat or  drink until Dr. [name] sees her in case an urgent procedure is needed.  2. Hypertension: Her blood pressure is elevated, but that may be in response to lower extremity ischemia. We will wait until the results of her Doppler studies are reviewed to see what is the best plan. Her baseline heart rate and blood pressure are high enough that her diltiazem could be increased from 180 mg to 240 mg daily. Another option is to increase her lisinopril from 5 mg up to 10 mg daily. We will leave this to the hospital team.  3. Hyperlipidemia: Her triglycerides were elevated and her previous profile although  her LDL is at target, her HDL is still low. We'll check a profile in the morning.  4. Polycythemia vera: She had phlebotomy today, so we will follow this carefully. Her hemoglobin and hematocrit are managed by phlebotomy. However, her white count is frequently elevated, we will check this in the hospital.   Current medicines are reviewed at length with the patient today. The patient does not have concerns regarding medicines.  The following changes have been made: no change  Labs/ tests ordered today include:  lower extremity Doppler   Disposition: FU with Dr. [name]  Signed, Lenoard Aden  01/28/2016 [facility] [address], [place], [place] [address] Phone: [phone]; Fax: [phone]      Agree with note by Rosaria Ferries PA-C  Patient well-known to me with diffuse vascular disease. I stented her ostium/proximal and mid right SFA with Viabahn  covered stents in July of last year. She has polycythemia vera and is on Eliquis oral anticoagulation because of PAF maintaining sinus rhythm. She developed acute onset right lower extremity  pain on Sunday with some numbness in her right foot. There was also some discoloration. She was seen in the office today by Rosaria Ferries PA-C obtain Doppler studies revealing a right ABI of 0.24 with an occluded  right SFA. I suspect her Viabahn Stents  have thrombosed. She will need angiography and potential surgical revascularization. Her Eliquis was discontinued and she will be started on IV heparin. I have spoken with Dr. Fletcher Anon who has agreed to perform her angina and first thing in the morning.   Lorretta Harp, M.D., [place], [facility], [place] [address] [place], [place]  [address]  [phone] 01/28/2016 5:52 PM

## 2016-01-28 NOTE — Progress Notes (Signed)
hemocue today was 10.9.  Called and spoke with DR Beryle Beams because original parameters for holding phlebotomy were 11.  Orders received to remove 250cc today instead of 500cc due to hemocue result.  Used right AC and phlebotomized 250cc and pt tolerated procedure well. Will monitor PT per protocol for 72min before DC home.

## 2016-01-28 NOTE — Patient Instructions (Signed)
Medication Instructions:  none  Labwork: none  Testing/Procedures: Your physician has requested that you have a lower extremity arterial exercise duplex. During this test, exercise and ultrasound are used to evaluate arterial blood flow in the legs. Allow one hour for this exam. There are no restrictions or special instructions.   Follow-Up: Will be setup after hospital admission.  Any Other Special Instructions Will Be Listed Below (If Applicable).     If you need a refill on your cardiac medications before your next appointment, please call your pharmacy.

## 2016-01-28 NOTE — Progress Notes (Signed)
ANTICOAGULATION CONSULT NOTE - Initial Consult  Pharmacy Consult for Heparin Indication: r/o ischemic event involving right leg   Patient Measurements: Height: 5' 5.5" (166.4 cm) Weight: 143 lb 15.4 oz (65.3 kg) IBW/kg (Calculated) : 58.15 Heparin Dosing Weight: 65  Vital Signs: Temp: 98.8 F (37.1 C) (02/07 1500) Temp Source: Oral (02/07 1500) BP: 171/62 mmHg (02/07 1500) Pulse Rate: 82 (02/07 1500)  Labs:  Recent Labs  01/28/16 1048  HGB 10.9*   Assessment: 30 yoF with hx of PAF and CVA on apixaban PTA that was admitted directly from MD office due to concern for embolic event involving R lower extremity. Last dose of apixaban was on 2/7 am at around 0600. Admitting hgb 10.9   Goal of Therapy:  Heparin level 0.3-0.7 units/ml Heparin level 0.3- 0.7 units/ml aPTT 66-102 seconds Monitor platelets by anticoagulation protocol: Yes   Plan:  1. Omit bolus do to receiving apixaban earlier today 2. Start heparin drip at 900 units/hr at 18:00 today 3. HL and aPTT in 6 hours    Vincenza Hews, PharmD, BCPS 01/28/2016, 5:24 PM Pager: [phone]

## 2016-01-28 NOTE — Progress Notes (Signed)
Cardiology Office Note   Date:  01/28/2016   ID:  [name], DOB 07-26-1951, MRN [id number]  PCP:  Octavio Graves, DO  Cardiologist:  Dr Stacy Gardner, PA-C   Chief complaint: Claudication symptoms  History of Present Illness: [name] is a 65 y.o. female with a history of PAD, L-ICA 50-70%, Hx stents L-EIA & R-SFA 2007, L-SFA 100% w/ R-SFA restent 06/2015, ABIs L 0.74, R 0.92 01/03/2016. Hx PAF, polycythemia vera, CVA, on Eliquis for anticoagulation.  [name] presents for evaluation of right lower extremity pain.  [name] had been doing very well. She was reasonably active. She was compliant with her medications. She is compliant with appointments for phlebotomy, the most recent one was today, 01/28/2016.  On Sunday morning, she had sudden onset of right lower extremity pain and numbness. The pain is mainly in her calf. She has some pain in her right foot. The numbness is in her right foot, especially on the top. She noted that her right toes were cold. She could barely walk because of the pain. She was compliant with her medications, but the symptoms did not improve.  She is aware that her blood pressure has been running high, and she has been compliant with her medications. She quit smoking several years ago and confirms that she has not started back.  She is concerned that something has happened to the last stent that Dr [name] put in.    Past Medical History  Diagnosis Date  . Hypertension   . PVD (peripheral vascular disease) with claudication ([place]) 07/2006    previous L ext iliac stent and rt SFA stent, known occluded Lt SFA  . Paroxysmal atrial fibrillation ([facility])     hx of a fib on ASA  AND ELIQUIS   . H/O cardiovascular stress test 12/27/2009    negative for ischemia  . NSTEMI (non-ST elevated myocardial infarction) ([facility]) 04/27/2013  . S/P cardiac cath 04/27/13    NON OBSTRUCTIVE DISEASE, 2+ mr, ef 65-70%  . CAD (coronary artery disease), per  cath 04/27/13, non obstructive disease, EF 65-70% 05/11/2013  . Mitral regurgitation,2+ by cath 05/11/2013  . Statin intolerance   . Carotid disease, bilateral ([facility]) 09/2013    Lt carotid 50-69% stentosis, less on rt  . Hyperlipidemia   . Dysrhythmia   . Stroke ([facility]) 2006    SWELLING ALL OVER AND RT SIDE OF FACE DRAWN AND SPEECH SLURRED AND NUMBNESS ON RIGHT SIDE-- ALL RESOLVED  . GERD (gastroesophageal reflux disease)   . Pain     BOTH KNEES - PT HAS TORN MENISCUS LEFT KNEE  . Glaucoma   . Arthritis   . Fatty liver   . Leukocytosis 07/15/2015  . Polycythemia vera ([place]) 08/20/2015    JAK-2 positive 07/19/15    Past Surgical History  Procedure Laterality Date  . Ovarian cyst removal Right   . Iliac artery stent  07/2006    external iliac and Rt SFA stent 07/2006 AND THE LEFT WAS IN 2006  . C section 1977    . Orif rt wrist  fracture  1983  . Knee arthroscopy with medial menisectomy Left 03/27/2014    Procedure: left knee arthorscopy with medial chondraplasty of the medial femoral and patella, medial microfracture technique of medial femoral condyl;  Surgeon: Tobi Bastos, MD;  Location: [facility];  Service: Orthopedics;  Laterality: Left;  . Left heart catheterization with coronary angiogram Right 04/27/2013    Procedure:  LEFT HEART CATHETERIZATION WITH CORONARY ANGIOGRAM;  Surgeon: Leonie Man, MD;  Location: [facility] CATH LAB;  Service: Cardiovascular;  Laterality: Right;  . Cardiac catheterization  04/27/13    non occlusive disease with mild to mod. calcified lesions in ostial LM and proximal LAD and moderate prox. RC AND DISTAL AV GROOVE LCX, ef  . Tubal ligation  1983  . Cataract extraction w/phaco Right 03/21/2015    Procedure: CATARACT EXTRACTION PHACO AND INTRAOCULAR LENS PLACEMENT RIGHT EYE;  Surgeon: Baruch Goldmann, MD;  Location: [facility] ORS;  Service: Ophthalmology;  Laterality: Right;  CDE:5.10  . Cataract extraction w/phaco Left 05/16/2015    Procedure: CATARACT EXTRACTION PHACO AND  INTRAOCULAR LENS PLACEMENT (IOC);  Surgeon: Baruch Goldmann, MD;  Location: [facility];  Service: Ophthalmology;  Laterality: Left;  CDE 5.57  . Sfa Right 06/27/2015    overlapping Bahn covered stents  . Peripheral vascular catheterization N/A 06/27/2015    Procedure: Lower Extremity Angiography;  Surgeon: Lorretta Harp, MD;  Location: [facility] CV LAB;  Service: Cardiovascular;  Laterality: N/A;    Current Outpatient Prescriptions  Medication Sig Dispense Refill  . acetaminophen (TYLENOL) 500 MG tablet Take 500 mg by mouth every 6 (six) hours as needed for mild pain.     Marland Kitchen aspirin EC 81 MG EC tablet Take 1 tablet (81 mg total) by mouth daily.    Marland Kitchen diltiazem (CARDIZEM CD) 180 MG 24 hr capsule Take 180 mg by mouth every morning.    . docusate sodium (COLACE) 100 MG capsule Take 200 mg by mouth daily.     Marland Kitchen ELIQUIS 5 MG TABS tablet TAKE ONE TABLET BY MOUTH TWICE A DAY AS DIRECTED 60 tablet 4  . fenofibrate (TRICOR) 145 MG tablet TAKE ONE TABLET BY MOUTH AT BEDTIME 30 tablet 11  . flecainide (TAMBOCOR) 50 MG tablet TAKE ONE TABLET BY MOUTH TWICE DAILY 60 tablet 11  . hydrochlorothiazide (MICROZIDE) 12.5 MG capsule Take 1 capsule (12.5 mg total) by mouth daily. 30 capsule 11  . lisinopril (PRINIVIL,ZESTRIL) 5 MG tablet Take 1 tablet (5 mg total) by mouth daily. 90 tablet 3  . omeprazole (PRILOSEC) 20 MG capsule Take 20 mg by mouth 2 (two) times daily as needed (acid reflux).     Vladimir Faster Glycol-Propyl Glycol (SYSTANE OP) Place 1 drop into both eyes 4 (four) times daily as needed (dryness).    . polyethylene glycol (MIRALAX / GLYCOLAX) packet Take 17 g by mouth daily as needed (constipation).    . pravastatin (PRAVACHOL) 40 MG tablet TAKE 1 TABLET (40 MG TOTAL) BY MOUTH EVERY EVENING. 30 tablet 10  . traMADol (ULTRAM) 50 MG tablet Take 50 mg by mouth 3 (three) times daily as needed (pain).     Marland Kitchen ZETIA 10 MG tablet TAKE ONE TABLET BY MOUTH AT BEDTIME 30 tablet 11   No current facility-administered  medications for this visit.   Facility-Administered Medications Ordered in Other Visits  Medication Dose Route Frequency Provider Last Rate Last Dose  . lidocaine (XYLOCAINE) 2 % (with pres) injection 2 mg  0.1 mL Intradermal Once Annia Belt, MD   2 mg at 01/28/16 1104    Allergies:   Crestor; Demerol; Effexor; Inderal; Lovaza; Penicillins; Percocet; Pravastatin; Warfarin and related; Atenolol; Lipitor; Nexium; Plavix; Simvastatin; and Trilipix    Social History:  The patient  reports that she quit smoking about 3 years ago. Her smoking use included Cigarettes. She has a 45 pack-year smoking history. She has never used smokeless  tobacco. She reports that she does not drink alcohol or use illicit drugs.   Family History:  The patient's family history includes CAD in her brother, brother, father, and mother; Healthy in her sister and sister; Heart attack in her mother; Heart attack (age of onset: 88) in her father; Heart disease in her father; Liver cancer in her brother; Lung cancer in her mother.    ROS:  Please see the history of present illness. All other systems are reviewed and negative.    PHYSICAL EXAM: VS:  BP 160/70 mmHg  Pulse 80  Ht 5' 5.5" (1.664 m)  Wt 146 lb (66.225 kg)  BMI 23.92 kg/m2 , BMI Body mass index is 23.92 kg/(m^2). GEN: Well nourished, well developed, female in no acute distress HEENT: normal for age  Neck: no JVD, bilateral carotid bruits, no masses, subclavian bruits present as well Cardiac: RRR; soft systolic murmur, no rubs, or gallops Respiratory:  clear to auscultation bilaterally, normal work of breathing GI: soft, nontender, nondistended, + BS MS: no deformity or atrophy; no edema; distal pulses are 2+ in all both upper extremities; left lower extremity is warm to touch but DP and PT pulses are not palpable. Capillary refill is within normal limits. Right lower extremity calf is a little cool and the coolness progresses in the foot with the toes  being very cold. There is duskiness and cyanosis to the right foot, especially on the sole. Her right third toe is very cyanotic and capillary refill is very slow. Skin: warm and dry, no rash Neuro:  Strength and sensation are intact Psych: euthymic mood, full affect   EKG:  EKG is not ordered today.  Recent Labs: 06/25/2015: TSH 2.113 12/02/2015: ALT 18; BUN 20; Creatinine, Ser 0.94; Hemoglobin 12.3; Platelets 307; Potassium 4.5; Sodium 139    Lipid Panel    Component Value Date/Time   CHOL 156 06/25/2015 0950   TRIG 361* 06/25/2015 0950   HDL 36* 06/25/2015 0950   CHOLHDL 4.3 06/25/2015 0950   VLDL 72* 06/25/2015 0950   LDLCALC 48 06/25/2015 0950     Wt Readings from Last 3 Encounters:  01/28/16 146 lb (66.225 kg)  01/28/16 145 lb (65.772 kg)  01/08/16 148 lb 3.2 oz (67.223 kg)     Other studies Reviewed: Additional studies/ records that were reviewed today include: Office Notes and procedure results.  ASSESSMENT AND PLAN: Dr. [name] will see the patient at [facility],  1.  PAD: She appears to have an acute issue with her right lower extremity circulation. The symptoms were very sudden in onset, the foot is cold and there is cyanosis. There is distinctly worse cyanosis in the right third toe. I'm concerned about an embolic event, even though she has been on Eliquis.   Dr. [name] was consulted by phone. He recommends urgent lower extremity Dopplers at the [facility] office and then admission to [facility] for heparinization and determination, once he reviews the Doppler results, of what is the best plan of care.   The patient was asked not to eat or drink until Dr. [name] sees her in case an urgent procedure is needed.  2. Hypertension: Her blood pressure is elevated, but that may be in response to lower extremity ischemia. We will wait until the results of her Doppler studies are reviewed to see what is the best plan. Her baseline heart rate and blood pressure are high enough that her  diltiazem could be increased from 180 mg to 240 mg daily. Another option  is to increase her lisinopril from 5 mg up to 10 mg daily. We will leave this to the hospital team.  3. Hyperlipidemia: Her triglycerides were elevated and her previous profile although her LDL is at target, her HDL is still low. We'll check a profile in the morning.  4. Polycythemia vera: She had phlebotomy today, so we will follow this carefully. Her hemoglobin and hematocrit are managed by phlebotomy. However, her white count is frequently elevated, we will check this in the hospital.   Current medicines are reviewed at length with the patient today.  The patient does not have concerns regarding medicines.  The following changes have been made:  no change  Labs/ tests ordered today include:   lower extremity Doppler   Disposition:   FU with Dr. [name]  Signed, Lenoard Aden  01/28/2016 [facility] [address], [place], [place]  [address] Phone: [phone]; Fax: [phone]

## 2016-01-29 ENCOUNTER — Encounter (HOSPITAL_COMMUNITY)
Admission: AD | Disposition: A | Discharge status: Home Health | Payer: Self-pay | Source: Ambulatory Visit | Attending: Cardiovascular Disease

## 2016-01-29 ENCOUNTER — Inpatient Hospital Stay (HOSPITAL_COMMUNITY): Payer: Medicare Other

## 2016-01-29 ENCOUNTER — Encounter (HOSPITAL_COMMUNITY): Payer: Self-pay | Admitting: Cardiovascular Disease

## 2016-01-29 DIAGNOSIS — I739 Peripheral vascular disease, unspecified: Secondary | ICD-10-CM

## 2016-01-29 DIAGNOSIS — I70211 Atherosclerosis of native arteries of extremities with intermittent claudication, right leg: Secondary | ICD-10-CM

## 2016-01-29 HISTORY — PX: PERIPHERAL VASCULAR CATHETERIZATION: SHX172C

## 2016-01-29 LAB — APTT
APTT: 60 s — AB (ref 24–37)
aPTT: 60 seconds — ABNORMAL HIGH (ref 24–37)

## 2016-01-29 LAB — CBC WITH DIFFERENTIAL/PLATELET
BASOS ABS: 0.2 10*3/uL — AB (ref 0.0–0.1)
Basophils Relative: 1 %
Eosinophils Absolute: 0.5 10*3/uL (ref 0.0–0.7)
Eosinophils Relative: 3 %
HEMATOCRIT: 38.1 % (ref 36.0–46.0)
HEMOGLOBIN: 11 g/dL — AB (ref 12.0–15.0)
LYMPHS ABS: 1.2 10*3/uL (ref 0.7–4.0)
Lymphocytes Relative: 7 %
MCH: 20 pg — ABNORMAL LOW (ref 26.0–34.0)
MCHC: 28.9 g/dL — ABNORMAL LOW (ref 30.0–36.0)
MCV: 69.1 fL — ABNORMAL LOW (ref 78.0–100.0)
MONO ABS: 0.5 10*3/uL (ref 0.1–1.0)
MONOS PCT: 3 %
NEUTROS PCT: 86 %
Neutro Abs: 14.5 10*3/uL — ABNORMAL HIGH (ref 1.7–7.7)
Platelets: 346 10*3/uL (ref 150–400)
RBC: 5.51 MIL/uL — AB (ref 3.87–5.11)
RDW: 19.5 % — ABNORMAL HIGH (ref 11.5–15.5)
WBC: 16.9 10*3/uL — AB (ref 4.0–10.5)

## 2016-01-29 LAB — COMPREHENSIVE METABOLIC PANEL
ALK PHOS: 54 U/L (ref 38–126)
ALT: 13 U/L — AB (ref 14–54)
AST: 20 U/L (ref 15–41)
Albumin: 3.6 g/dL (ref 3.5–5.0)
Anion gap: 11 (ref 5–15)
BUN: 22 mg/dL — ABNORMAL HIGH (ref 6–20)
CALCIUM: 9.1 mg/dL (ref 8.9–10.3)
CO2: 24 mmol/L (ref 22–32)
CREATININE: 0.96 mg/dL (ref 0.44–1.00)
Chloride: 103 mmol/L (ref 101–111)
GFR calc non Af Amer: >60 mL/min (ref >60)
GLUCOSE: 132 mg/dL — AB (ref 65–99)
Potassium: 3.7 mmol/L (ref 3.5–5.1)
SODIUM: 138 mmol/L (ref 135–145)
Total Bilirubin: 0.4 mg/dL (ref 0.3–1.2)
Total Protein: 6.2 g/dL — ABNORMAL LOW (ref 6.5–8.1)

## 2016-01-29 LAB — HEPARIN LEVEL (UNFRACTIONATED): HEPARIN UNFRACTIONATED: 1.32 [IU]/mL — AB (ref 0.30–0.70)

## 2016-01-29 LAB — HEMOGLOBIN A1C
HEMOGLOBIN A1C: 5.7 % — AB (ref 4.8–5.6)
Mean Plasma Glucose: 117 mg/dL

## 2016-01-29 LAB — POCT ACTIVATED CLOTTING TIME: Activated Clotting Time: 157 seconds

## 2016-01-29 SURGERY — LOWER EXTREMITY ANGIOGRAPHY

## 2016-01-29 MED ORDER — SODIUM CHLORIDE 0.9 % IV SOLN: 250.0000 mL | INTRAVENOUS | Status: DC | PRN

## 2016-01-29 MED ORDER — LIDOCAINE HCL (PF) 1 % IJ SOLN
INTRAMUSCULAR | Status: AC
  Filled 2016-01-29: qty 30

## 2016-01-29 MED ORDER — HEPARIN (PORCINE) IN NACL 100-0.45 UNIT/ML-% IJ SOLN
1350.0000 [IU]/h | INTRAMUSCULAR | Status: AC
  Administered 2016-01-29: 1100 [IU]/h via INTRAVENOUS
  Administered 2016-01-30: 1200 [IU]/h via INTRAVENOUS
  Filled 2016-01-29: qty 250

## 2016-01-29 MED ORDER — SODIUM CHLORIDE 0.9% FLUSH: 3.0000 mL | INTRAVENOUS | Status: DC | PRN

## 2016-01-29 MED ORDER — MIDAZOLAM HCL 2 MG/2ML IJ SOLN
INTRAMUSCULAR | Status: DC | PRN
  Administered 2016-01-29 (×2): 1 mg via INTRAVENOUS

## 2016-01-29 MED ORDER — HYDRALAZINE HCL 20 MG/ML IJ SOLN: 10.0000 mg | Freq: Three times a day (TID) | INTRAMUSCULAR | Status: DC | PRN

## 2016-01-29 MED ORDER — SODIUM CHLORIDE 0.9% FLUSH
3.0000 mL | Freq: Two times a day (BID) | INTRAVENOUS | Status: DC
  Administered 2016-01-30: 3 mL via INTRAVENOUS

## 2016-01-29 MED ORDER — FENTANYL CITRATE (PF) 100 MCG/2ML IJ SOLN
INTRAMUSCULAR | Status: AC
  Filled 2016-01-29: qty 2

## 2016-01-29 MED ORDER — HEPARIN (PORCINE) IN NACL 2-0.9 UNIT/ML-% IJ SOLN
INTRAMUSCULAR | Status: DC | PRN
  Administered 2016-01-29: 15 mL

## 2016-01-29 MED ORDER — HEPARIN (PORCINE) IN NACL 2-0.9 UNIT/ML-% IJ SOLN
INTRAMUSCULAR | Status: AC
  Filled 2016-01-29: qty 1000

## 2016-01-29 MED ORDER — MIDAZOLAM HCL 2 MG/2ML IJ SOLN
INTRAMUSCULAR | Status: AC
  Filled 2016-01-29: qty 2

## 2016-01-29 MED ORDER — SODIUM CHLORIDE 0.9 % IV SOLN: INTRAVENOUS | Status: AC

## 2016-01-29 SURGICAL SUPPLY — 10 items
CATH ANGIO 5F PIGTAIL 65CM (CATHETERS) ×2 IMPLANT
KIT MICROINTRODUCER STIFF 5F (SHEATH) ×2 IMPLANT
KIT PV (KITS) ×4 IMPLANT
SHEATH PINNACLE 5F 10CM (SHEATH) ×2 IMPLANT
STOPCOCK MORSE 400PSI 3WAY (MISCELLANEOUS) ×2 IMPLANT
SYRINGE MEDRAD AVANTA MACH 7 (SYRINGE) ×2 IMPLANT
TRANSDUCER W/STOPCOCK (MISCELLANEOUS) ×4 IMPLANT
TRAY PV CATH (CUSTOM PROCEDURE TRAY) ×4 IMPLANT
TUBING CIL FLEX 10 FLL-RA (TUBING) ×2 IMPLANT
WIRE HITORQ VERSACORE ST 145CM (WIRE) ×2 IMPLANT

## 2016-01-29 NOTE — Interval H&P Note (Signed)
History and Physical Interval Note:  01/29/2016 8:34 AM  [name]  has presented today for surgery, with the diagnosis of claudication  The various methods of treatment have been discussed with the patient and family. After consideration of risks, benefits and other options for treatment, the patient has consented to  Procedure(s): Lower Extremity Angiography (N/A) as a surgical intervention .  The patient's history has been reviewed, patient examined, no change in status, stable for surgery.  I have reviewed the patient's chart and labs.  Questions were answered to the patient's satisfaction.     Kathlyn Sacramento

## 2016-01-29 NOTE — Progress Notes (Signed)
ANTICOAGULATION CONSULT NOTE - Follow Up Consult  Pharmacy Consult for Heparin Indication: atrial fibrillation, PVD  Allergies  Allergen Reactions  . Prednisone Other (See Comments)    Muscle spasms  . Statins Itching and Other (See Comments)    Simvastatin-caused severe itching Pravastatin-caused lesser tiching One type of statin? Caused stroke in 2006, and other symptoms as result  . Warfarin And Related Other (See Comments)    Caused nose bleeds  . Lactose Intolerance (Gi) Other (See Comments)    Bloating, gas  . Penicillins Hives and Other (See Comments)    Questionable high fever when taken and broke out in whelps.   . Simvastatin Itching and Rash    Also lipitor intolerant  . Atenolol Rash  . Crestor [Rosuvastatin] Itching and Rash  . Demerol [Meperidine] Other (See Comments)    Unknown, can't remember   . Effexor [Venlafaxine Hcl] Other (See Comments)    Doesn't remember   . Inderal [Propranolol] Other (See Comments)    Doesn't remember   . Lipitor [Atorvastatin] Rash  . Lovaza [Omega-3-Acid Ethyl Esters] Other (See Comments)    Doesn't remember   . Nexium [Esomeprazole Magnesium] Rash  . Percocet [Oxycodone-Acetaminophen] Other (See Comments)    Doesn't remember   . Plavix [Clopidogrel Bisulfate] Rash  . Pravastatin Itching  . Trilipix [Choline Fenofibrate] Other (See Comments)    Doesn't remember     Patient Measurements: Height: 5' 5.5" (166.4 cm) Weight: 143 lb 4.8 oz (65 kg) IBW/kg (Calculated) : 58.15 Heparin Dosing Weight: 65 kg  Vital Signs: Temp: 98.2 F (36.8 C) (02/08 0512) Temp Source: Oral (02/08 0512) BP: 163/77 mmHg (02/08 1057) Pulse Rate: 82 (02/08 0945)  Labs:  Recent Labs  01/28/16 1048 01/28/16 1724 01/29/16 0035 01/29/16 0036 01/29/16 0730  HGB 10.9* 11.0*  --   --   --   HCT  --  38.1  --   --   --   PLT  --  346  --   --   --   APTT  --  53*  --  60* 60*  LABPROT  --  15.9*  --   --   --   INR  --  1.25  --   --   --    HEPARINUNFRC  --  2.20* 1.32*  --   --   CREATININE  --  0.94 0.96  --   --     Estimated Creatinine Clearance: 54.4 mL/min (by C-G formula based on Cr of 0.96).   Medications:  Scheduled:  . aspirin EC  81 mg Oral Daily  . diltiazem  180 mg Oral q morning - 10a  . docusate sodium  200 mg Oral QHS  . ezetimibe  10 mg Oral QHS  . flecainide  50 mg Oral BID  . lisinopril  5 mg Oral QHS  . sodium chloride flush  3 mL Intravenous Q12H  . sodium chloride flush  3 mL Intravenous Q12H   Infusions:  . sodium chloride 50 mL/hr at 01/28/16 1844  . sodium chloride 75 mL/hr (01/29/16 [id number])    Assessment: [name] with hx PAF on Apixaban PTA for hx afib.  Pt was admitted with concern for RLE embolus.  Pt s/p angiogram today showing RLE thrombosis.  Recommending R femoropopliteal bypass and cascular surgery consult placed.  In the interim, pt has been bridged with IV heparin infusion.  Plan to restart heparin 8 hours after sheath pull (0945).  Apixaban can affect  the Anti-Xa level that is used for monitoring heparin.  Therefore, aPTTs will be used for monitoring.    Goal of Therapy:  Heparin level 0.3-0.7 units/ml aPTT 66-102 seconds Monitor platelets by anticoagulation protocol: Yes   Plan:  At 1745 tonight, restart heparin at 1100 units/hr. Heparin level and aPTT in 8 hours (0200 2/9) Daily heparin level and CBC.  [employer], Pharm.D., BCPS Clinical Pharmacist Pager [phone] 01/29/2016 11:12 AM

## 2016-01-29 NOTE — Consult Note (Addendum)
Hospital Consult    Reason for Consult:  PAD s/p arteriogram by Dr. Fletcher Anon Referring Physician:  Dr. Fletcher Anon MRN #:  [id number]  History of Present Illness: This is a 65 y.o. female who has hx of stents in the right leg in the past x 3 per the pt.  She states that she had been in her usual state of health until Sunday morning around 6am.  She states she awoke to pain in her right leg.  She describes her foot as being cold, painful and numb.  She states that when she would touch her foot, it felt as if she was touching a rock and that her foot was "dead".   She states she stood and tried to walk around to ease the pain, but she did not get any relief.  She does not have a car and did not have a ride to the hospital.  Later that afternoon, she was finally able to elevate her legs and take a nap.  She states that the feeling has come back a little bit on the bottom of the foot, but is nothing close to normal.  She states before this event on Sunday, she could walk to the [place], which is about 4 blocks away without difficulty.  She would get pain in her hips and could resume walking after rest.  She does have some claudication of the left calf as well.  She states this pain is worse than her original pain that led up to her stent insertion.  She called Dr. Kennon Holter office at [facility] Monday morning.  The RN called her back and she was scheduled for an appt on Tuesday.  She was seen by the PA and after discussion with Dr. [name], she was admitted to the hospital and underwent aortogram with BLE runoff.  She was found to have an occlusion of the previously placed covered stent of the right SFA throughout its course with 3 vessel runoff below the knee with evidence of some embolization of distal branches.  The left iliac stents are patent with chronically occluded left SFA with good collaterals from the profunda and three vessel runoff below the knee.  She has been recommended to have a right femoral to popliteal bypass  grafting.  Her heparin will be resumed later today.  She does have a hx of smoking, but did quit almost 4 years ago.   She has hx of carotid artery disease with 60-79% stenosis of the left and 1-39% on the right.  Her last carotid duplex was in August 2016.  She states that she had a mild stroke years ago, which was due to a certain medication that she was on.  She does not have any residual and has not had any sx since then.   She does have an intolerance to statins.     Past Medical History  Diagnosis Date  . Hypertension   . PVD (peripheral vascular disease) with claudication ([place]) 07/2006    previous L ext iliac stent and rt SFA stent, known occluded Lt SFA  . Paroxysmal atrial fibrillation (HCC)     hx of a fib on ASA  AND ELIQUIS   . H/O cardiovascular stress test 12/27/2009    negative for ischemia  . NSTEMI (non-ST elevated myocardial infarction) ([place]) 04/27/2013  . S/P cardiac cath 04/27/13    NON OBSTRUCTIVE DISEASE, 2+ mr, ef 65-70%  . CAD (coronary artery disease), per cath 04/27/13, non obstructive disease, EF 65-70% 05/11/2013  .  Mitral regurgitation,2+ by cath 05/11/2013  . Statin intolerance   . Carotid disease, bilateral ([place]) 09/2013    Lt carotid 50-69% stentosis, less on rt  . Hyperlipidemia   . Dysrhythmia   . GERD (gastroesophageal reflux disease)   . Pain     BOTH KNEES - PT HAS TORN MENISCUS LEFT KNEE  . Glaucoma   . Fatty liver   . Leukocytosis 07/15/2015  . Polycythemia vera ([place]) 08/20/2015    JAK-2 positive 07/19/15  . Headache     "occasional since eye pressure regulated" (01/28/2016)  . Migraine     "stopped w/laser holes to relieve pressure in my eyes; had them 3-4 times/wk before taht" (01/28/2016)  . Stroke ([place]) 2006    SWELLING ALL OVER AND RT SIDE OF FACE DRAWN AND SPEECH SLURRED AND NUMBNESS ON RIGHT SIDE-- ALL RESOLVED  . Arthritis     "fingers, some in my knees" (01/28/2016)    Past Surgical History  Procedure Laterality Date  . Ovarian cyst removal  Right   . Iliac artery stent  07/2006    external iliac and Rt SFA stent 07/2006 AND THE LEFT WAS IN 2006  . Cesarean section  1977  . Orif wrist fracture Right 1983  . Knee arthroscopy with medial menisectomy Left 03/27/2014    Procedure: left knee arthorscopy with medial chondraplasty of the medial femoral and patella, medial microfracture technique of medial femoral condyl;  Surgeon: Tobi Bastos, MD;  Location: [facility]S;  Service: Orthopedics;  Laterality: Left;  . Left heart catheterization with coronary angiogram Right 04/27/2013    Procedure: LEFT HEART CATHETERIZATION WITH CORONARY ANGIOGRAM;  Surgeon: Leonie Man, MD;  Location: [facility] CATH LAB;  Service: Cardiovascular;  Laterality: Right;  . Tubal ligation  1983  . Cataract extraction w/phaco Right 03/21/2015    Procedure: CATARACT EXTRACTION PHACO AND INTRAOCULAR LENS PLACEMENT RIGHT EYE;  Surgeon: Baruch Goldmann, MD;  Location: [facility] ORS;  Service: Ophthalmology;  Laterality: Right;  CDE:5.10  . Cataract extraction w/phaco Left 05/16/2015    Procedure: CATARACT EXTRACTION PHACO AND INTRAOCULAR LENS PLACEMENT (IOC);  Surgeon: Baruch Goldmann, MD;  Location: [facility] ORS;  Service: Ophthalmology;  Laterality: Left;  CDE 5.57  . Sfa Right 06/27/2015    overlapping Bahn covered stents  . Peripheral vascular catheterization N/A 06/27/2015    Procedure: Lower Extremity Angiography;  Surgeon: Lorretta Harp, MD;  Location: [facility] CV LAB;  Service: Cardiovascular;  Laterality: N/A;  . Fracture surgery    . Cardiac catheterization  04/27/13    non occlusive disease with mild to mod. calcified lesions in ostial LM and proximal LAD and moderate prox. RC AND DISTAL AV GROOVE LCX, ef  . Refractive surgery Bilateral     "6 in one eye; 7 in the other; to relieve pressure; not glaucoma" (01/28/2016)    Allergies  Allergen Reactions  . Prednisone Other (See Comments)    Muscle spasms  . Statins Itching and Other (See Comments)    Simvastatin-caused severe  itching Pravastatin-caused lesser tiching One type of statin? Caused stroke in 2006, and other symptoms as result  . Warfarin And Related Other (See Comments)    Caused nose bleeds  . Lactose Intolerance (Gi) Other (See Comments)    Bloating, gas  . Penicillins Hives and Other (See Comments)    Questionable high fever when taken and broke out in whelps.   . Simvastatin Itching and Rash    Also lipitor intolerant  . Atenolol Rash  .  Crestor [Rosuvastatin] Itching and Rash  . Demerol [Meperidine] Other (See Comments)    Unknown, can't remember   . Effexor [Venlafaxine Hcl] Other (See Comments)    Doesn't remember   . Inderal [Propranolol] Other (See Comments)    Doesn't remember   . Lipitor [Atorvastatin] Rash  . Lovaza [Omega-3-Acid Ethyl Esters] Other (See Comments)    Doesn't remember   . Nexium [Esomeprazole Magnesium] Rash  . Percocet [Oxycodone-Acetaminophen] Other (See Comments)    Doesn't remember   . Plavix [Clopidogrel Bisulfate] Rash  . Pravastatin Itching  . Trilipix [Choline Fenofibrate] Other (See Comments)    Doesn't remember     Prior to Admission medications   Medication Sig Start Date End Date Taking? Authorizing Provider  acetaminophen (TYLENOL) 500 MG tablet Take 1,000 mg by mouth every 6 (six) hours as needed for moderate pain.    Yes Historical Provider, MD  aspirin EC 81 MG EC tablet Take 1 tablet (81 mg total) by mouth daily. 06/28/15  Yes Isaiah Serge, NP  diltiazem (CARDIZEM CD) 180 MG 24 hr capsule Take 180 mg by mouth every morning.   Yes Historical Provider, MD  docusate sodium (COLACE) 100 MG capsule Take 200 mg by mouth at bedtime.    Yes Historical Provider, MD  ELIQUIS 5 MG TABS tablet TAKE ONE TABLET BY MOUTH TWICE A DAY AS DIRECTED 11/21/15  Yes Lorretta Harp, MD  fenofibrate (TRICOR) 145 MG tablet TAKE ONE TABLET BY MOUTH AT BEDTIME 06/21/15  Yes Lorretta Harp, MD  flecainide (TAMBOCOR) 50 MG tablet TAKE ONE TABLET BY MOUTH TWICE DAILY  06/21/15  Yes Lorretta Harp, MD  lisinopril (PRINIVIL,ZESTRIL) 5 MG tablet Take 1 tablet (5 mg total) by mouth daily. Patient taking differently: Take 5 mg by mouth at bedtime.  01/08/16  Yes Lorretta Harp, MD  omeprazole (PRILOSEC) 20 MG capsule Take 20 mg by mouth 2 (two) times daily before a meal.    Yes Historical Provider, MD  polyvinyl alcohol (LIQUIFILM TEARS) 1.4 % ophthalmic solution Place 1 drop into both eyes as needed for dry eyes.   Yes Historical Provider, MD  traMADol (ULTRAM) 50 MG tablet Take 50 mg by mouth See admin instructions. Currently: taking med between 2-3 times daily for pain 08/21/14  Yes Historical Provider, MD  ZETIA 10 MG tablet TAKE ONE TABLET BY MOUTH AT BEDTIME 06/21/15  Yes Lorretta Harp, MD  hydrochlorothiazide (MICROZIDE) 12.5 MG capsule Take 1 capsule (12.5 mg total) by mouth daily. 07/11/15   Lorretta Harp, MD  pravastatin (PRAVACHOL) 40 MG tablet TAKE 1 TABLET (40 MG TOTAL) BY MOUTH EVERY EVENING. 09/20/15   Lorretta Harp, MD    Social History   Social History  . Marital Status: Widowed    Spouse Name: N/A  . Number of Children: 2  . Years of Education: 12   Occupational History  . CNA    Social History Main Topics  . Smoking status: Former Smoker -- 1.00 packs/day for 45 years    Types: Cigarettes    Quit date: 11/11/2012  . Smokeless tobacco: Never Used  . Alcohol Use: No  . Drug Use: No  . Sexual Activity: Not Currently    Birth Control/ Protection: None   Other Topics Concern  . Not on file   Social History Narrative     Family History  Problem Relation Age of Onset  . CAD Mother   . Lung cancer Mother   . Heart  attack Mother   . CAD Father   . Heart disease Father   . Heart attack Father 78    died in hes 28's with heart disease  . Healthy Sister   . Liver cancer Brother   . Healthy Sister   . CAD Brother     has had CABG  . CAD Brother     ROS: [x]  Positive   [ ]  Negative   [ ]  All sytems reviewed and are  negative  Cardiovascular: []  chest pain/pressure []  palpitations [x]  hx PAF on Eliquis  []  SOB lying flat []  DOE [x]  pain in legs while walking [x]  pain in legs at rest [x]  pain in legs at night []  non-healing ulcers []  hx of DVT []  swelling in legs  Pulmonary: []  productive cough []  asthma/wheezing []  home O2  Neurologic: []  weakness in []  arms []  legs []  numbness in []  arms []  legs []  hx of CVA [x]  mini stroke [] difficulty speaking or slurred speech []  temporary loss of vision in one eye []  dizziness  Hematologic: []  hx of cancer []  bleeding problems [x]  polycythemia vera []  problems with blood clotting easily  HENT: [x]  hx of glaucoma-no longer being treated for this [x]  migraines  Endocrine:   []  diabetes []  thyroid disease  GI []  vomiting blood []  blood in stool [x]  GERD  GU: []  CKD/renal failure []  HD--[]  M/W/F or []  T/T/S []  burning with urination []  blood in urine  Psychiatric: []  anxiety []  depression  Musculoskeletal: []  arthritis [x]  joint pain-bilateral knees  Integumentary: []  rashes []  ulcers  Constitutional: []  fever []  chills   Physical Examination  Filed Vitals:   01/29/16 1130 01/29/16 1142  BP: 169/70 176/71  Pulse: 83 83  Temp:  98.1 F (36.7 C)  Resp:     Body mass index is 23.48 kg/(m^2).  General:  WDWN in NAD Gait: Not observed HENT: WNL, normocephalic Pulmonary: normal non-labored breathing, without Rales, rhonchi,  wheezing Cardiac: regular, without  Murmurs, rubs or gallops; with left carotid bruit Abdomen:  soft, NT/ND, no masses; well healed midline scar Skin: without rashes Vascular Exam/Pulses:  Right Left  Radial 2+ (normal) 3+ (hyperdynamic)  Femoral 2+ (normal) Cath site-not palpated  Popliteal Unable to palpate  Unable to palpate   DP Unable to palpate  Unable to palpate   PT Unable to palpate  1+ (weak)   Extremities: right leg cool to touch; right leg is warm.  Right 3rd toe is  cyanotic.  No ulcers or wounds. Musculoskeletal: no muscle wasting or atrophy  Neurologic: A&O X 3; Appropriate Affect ; SENSATION: normal; MOTOR FUNCTION:  moving all extremities equally. Speech is fluent/normal Psychiatric:  Normal affect   CBC    Component Value Date/Time   WBC 16.9* 01/28/2016 1724   RBC 5.51* 01/28/2016 1724   HGB 11.0* 01/28/2016 1724   HCT 38.1 01/28/2016 1724   PLT 346 01/28/2016 1724   MCV 69.1* 01/28/2016 1724   MCH 20.0* 01/28/2016 1724   MCHC 28.9* 01/28/2016 1724   RDW 19.5* 01/28/2016 1724   LYMPHSABS 1.2 01/28/2016 1724   MONOABS 0.5 01/28/2016 1724   EOSABS 0.5 01/28/2016 1724   BASOSABS 0.2* 01/28/2016 1724    BMET    Component Value Date/Time   NA 138 01/29/2016 0035   K 3.7 01/29/2016 0035   CL 103 01/29/2016 0035   CO2 24 01/29/2016 0035   GLUCOSE 132* 01/29/2016 0035   BUN 22* 01/29/2016 0035   CREATININE 0.96 01/29/2016  0035   CREATININE 1.02* 07/26/2015 1022   CALCIUM 9.1 01/29/2016 0035   GFRNONAA >60 01/29/2016 0035   GFRAA >60 01/29/2016 0035    COAGS: Lab Results  Component Value Date   INR 1.25 01/28/2016   INR 1.15 06/25/2015   INR 1.38 03/20/2014     Non-Invasive Vascular Imaging:   ABI's 01/28/16 Right:  0.24 Left:  0.72  Carotid duplex August 2016: She has hx of carotid artery disease with 60-79% stenosis of the left and 1-39% on the right.   Statin:  Yes.   Beta Blocker:  No. Aspirin:  Yes.   ACEI:  Yes.   ARB:  No. Other antiplatelets/anticoagulants:  Yes.   Eliquis prior to admission.  Now on heparin gtt.   ASSESSMENT/PLAN: This is a 65 y.o. female with PAD with acute ischemia of right leg s/p aortogram with BLE runoff   -pt has hx of left EIA stent and right SFA stenting in 2007.  In July 2016, she underwent PTA right SFA chronic total occlusion and stent of the right SFA using averlapping Viabahn covered stents now with occlusion. -embolization likely of right 3rd toe despite Eliquis.  She does  have a hx of PAF.  May need echo. -heparin to restart later this afternoon. -pt most likely will need right femoral to popliteal bypass grafting and will therefore order lower extremity vein mapping.  -left carotid bruit-she does have 60-79% left carotid artery stenosis by duplex in August 2016 -she does have polycythemia vera that is treated with phlebotomy -she does have leukocytosis, which is frequently elevated per Dr. Kennon Holter H&P.   -hypercholesterolemia-statin intolerant.  She tries to eat healthy diet -Dr. Kellie Simmering to review aortogram and see pt this afternoon.   Leontine Locket, PA-C [facility] [phone]  Agree with above assessment Patient developed acute symptoms 3 days ago which have slightly improved. She has reocclusion of right superficial femoral artery which has been stented on 2 or 3 previous occasions by Dr. [name] area She is candidate for right femoral-popliteal bypass graft On exam she does have some bluish discoloration of her fourth toe but otherwise foot is adequately perfused.  Discussed this with patient will proceed on Friday with right femoral-popliteal bypass graft with either PTFE or saphenous vein. Do not think patient needs any further testing from cardiology standpoint but will leave that decision up to Dr. Fletcher Anon for Dr. [name]

## 2016-01-29 NOTE — Progress Notes (Signed)
VASCULAR LAB PRELIMINARY  PRELIMINARY  PRELIMINARY  PRELIMINARY  Bilateral Lower Extremity Vein Map    Right Great Saphenous Vein   Segment Diameter Depth Comment  1. Origin 5.41 mm 15.53mm    2. High Thigh 3.46 mm 10.18mm    3. Mid Thigh 2.56 mm 7.13mm  Branch 2.27mm  4. Low Thigh 2.61 mm 2.24mm    5. At Knee 3.1 mm 3.103mm  Branch 2.39mm  6. High Calf 2.47 mm 2.34mm  Multiple small branches throughout the calf.  7. Low Calf 2.1 mm 2.12mm    8. Ankle 1.74 mm 1.32mm    mm     mm     mm       Left Great Saphenous Vein   Segment Diameter Depth CoDepthmment  1. Origin 3.63 mm 18.5 mm    2. High Thigh 4.2 mm 9.70mm   3. Mid Thigh 3.34 mm 5.55mm  Branch 2.45mm  4. Low Thigh 2.97 mm 2.58mm  Branch 1.55mm  5. At Knee 2.84 mm 2.52mm    6. High Calf 2.33 mm 3.49mm Branch 1.75mm  between 6and 7  7. Low Calf 3.11 mm 6.76mm   8. Ankle 2.65 mm 6.36mm    mm     mm     mm       [name], RVT 01/29/2016, 3:55 PM

## 2016-01-29 NOTE — Progress Notes (Signed)
ANTICOAGULATION CONSULT NOTE - Follow Up Consult  Pharmacy Consult for heparin Indication: r/o ischemic event RLE   Labs:  Recent Labs  01/28/16 1048 01/28/16 1724 01/29/16 0035 01/29/16 0036  HGB 10.9* 11.0*  --   --   HCT  --  38.1  --   --   PLT  --  346  --   --   APTT  --  53*  --  60*  LABPROT  --  15.9*  --   --   INR  --  1.25  --   --   HEPARINUNFRC  --  2.20* 1.32*  --   CREATININE  --  0.94 0.96  --      Assessment: 64yo female subtherapeutic on heparin with initial dosing while Eliquis on hold.  Goal of Therapy:  aPTT 66-102 seconds   Plan:  Will increase heparin gtt by 1-2 units/kg/hr to 1000 units/hr and check PTT in 6hr.  Wynona Neat, PharmD, BCPS  01/29/2016,2:06 AM

## 2016-01-29 NOTE — Care Management Note (Signed)
Case Management Note  Patient Details  Name: [name] MRN: [id number] Date of Birth: 1951/04/29  Subjective/Objective:               [name] with hx PAF on Apixaban PTA for hx afib. Pt was admitted 01/28/2016 with concern for RLE embolus. Pt s/p angiogram today showing RLE thrombosis. Recommending R femoropopliteal bypass and cascular surgery consult placed. In the interim, pt has been bridged with IV heparin infusion. Plan to restart heparin 8 hours after sheath pull (0945).     Action/Plan: CM will continue to follow for discharge.  needs   Expected Discharge Date:                  Expected Discharge Plan:     In-House Referral:     Discharge planning Services     Post Acute Care Choice:    Choice offered to:     DME Arranged:    DME Agency:     HH Arranged:    [place] Agency:     Status of Service:     Medicare Important Message Given:    Date Medicare IM Given:    Medicare IM give by:    Date Additional Medicare IM Given:    Additional Medicare Important Message give by:     If discussed at [place] of Stay Meetings, dates discussed:    Additional Comments:  Delrae Sawyers, RN 01/29/2016, 11:41 AM

## 2016-01-29 NOTE — Progress Notes (Signed)
Paged by nurse regarding uncontrolled BP, given critical LE ischemia, hesitant to drop BP too low, will add IV hydralazine with instruction to only give it if SBP > 180.  Hilbert Corrigan PA Pager: [phone]

## 2016-01-29 NOTE — Progress Notes (Signed)
Site area: LFA Site Prior to Removal:  Level 0 Pressure Applied For:20 min Manual: yes   Patient Status During Pull:  stable Post Pull Site:  Level Post Pull Instructions Given:  yes Post Pull Pulses Present: doppler Dressing Applied:  clear Bedrest begins @ [id number] till 1345 Comments:

## 2016-01-30 DIAGNOSIS — I480 Paroxysmal atrial fibrillation: Secondary | ICD-10-CM

## 2016-01-30 LAB — CBC
HEMATOCRIT: 34.5 % — AB (ref 36.0–46.0)
HEMOGLOBIN: 9.8 g/dL — AB (ref 12.0–15.0)
MCH: 19.6 pg — AB (ref 26.0–34.0)
MCHC: 28.4 g/dL — AB (ref 30.0–36.0)
MCV: 68.9 fL — ABNORMAL LOW (ref 78.0–100.0)
Platelets: 327 10*3/uL (ref 150–400)
RBC: 5.01 MIL/uL (ref 3.87–5.11)
RDW: 19.4 % — AB (ref 11.5–15.5)
WBC: 14.8 10*3/uL — ABNORMAL HIGH (ref 4.0–10.5)

## 2016-01-30 LAB — APTT
aPTT: 54 seconds — ABNORMAL HIGH (ref 24–37)
aPTT: 54 seconds — ABNORMAL HIGH (ref 24–37)
aPTT: 69 seconds — ABNORMAL HIGH (ref 24–37)

## 2016-01-30 LAB — HEPARIN LEVEL (UNFRACTIONATED): Heparin Unfractionated: 0.29 IU/mL — ABNORMAL LOW (ref 0.30–0.70)

## 2016-01-30 MED ORDER — VANCOMYCIN HCL IN DEXTROSE 1-5 GM/200ML-% IV SOLN
1000.0000 mg | INTRAVENOUS | Status: AC
  Administered 2016-01-31: 1000 mg via INTRAVENOUS

## 2016-01-30 NOTE — Progress Notes (Signed)
ANTICOAGULATION CONSULT NOTE - Follow Up Consult  Pharmacy Consult for heparin Indication: Afib and PVD   Labs:  Recent Labs  01/28/16 1724 01/29/16 0035 01/29/16 0036 01/29/16 0730 01/30/16 0204  HGB 11.0*  --   --   --  9.8*  HCT 38.1  --   --   --  34.5*  PLT 346  --   --   --  PENDING  APTT 53*  --  60* 60* 54*  LABPROT 15.9*  --   --   --   --   INR 1.25  --   --   --   --   HEPARINUNFRC 2.20* 1.32*  --   --  0.29*  CREATININE 0.94 0.96  --   --   --      Assessment: 64yo female subtherapeutic on heparin after resumed.  Goal of Therapy:  Heparin level 0.3-0.7 units/ml aPTT 66-102 seconds   Plan:  Will increase heparin gtt by 1-2 units/kg/hr to 1200 units/hr and check PTT in 6hr.  Wynona Neat, PharmD, BCPS  01/30/2016,3:10 AM

## 2016-01-30 NOTE — Progress Notes (Signed)
ANTICOAGULATION CONSULT NOTE - Follow Up Consult  Pharmacy Consult for Heparin Indication: atrial fibrillation, PVD  Allergies  Allergen Reactions  . Prednisone Other (See Comments)    Muscle spasms  . Statins Itching and Other (See Comments)    Simvastatin-caused severe itching Pravastatin-caused lesser tiching One type of statin? Caused stroke in 2006, and other symptoms as result  . Warfarin And Related Other (See Comments)    Caused nose bleeds  . Lactose Intolerance (Gi) Other (See Comments)    Bloating, gas  . Penicillins Hives and Other (See Comments)    Questionable high fever when taken and broke out in whelps.   . Simvastatin Itching and Rash    Also lipitor intolerant  . Atenolol Rash  . Crestor [Rosuvastatin] Itching and Rash  . Demerol [Meperidine] Other (See Comments)    Unknown, can't remember   . Effexor [Venlafaxine Hcl] Other (See Comments)    Doesn't remember   . Inderal [Propranolol] Other (See Comments)    Doesn't remember   . Lipitor [Atorvastatin] Rash  . Lovaza [Omega-3-Acid Ethyl Esters] Other (See Comments)    Doesn't remember   . Nexium [Esomeprazole Magnesium] Rash  . Percocet [Oxycodone-Acetaminophen] Other (See Comments)    Doesn't remember   . Plavix [Clopidogrel Bisulfate] Rash  . Pravastatin Itching  . Trilipix [Choline Fenofibrate] Other (See Comments)    Doesn't remember     Patient Measurements: Height: 5' 5.5" (166.4 cm) Weight: 142 lb 3.2 oz (64.501 kg) IBW/kg (Calculated) : 58.15 Heparin Dosing Weight: 65 kg  Vital Signs: Temp: 98 F (36.7 C) (02/09 0607) Temp Source: Oral (02/09 0607) BP: 138/55 mmHg (02/09 0607) Pulse Rate: 67 (02/09 0607)  Labs:  Recent Labs  01/28/16 1724 01/29/16 0035  01/29/16 0730 01/30/16 0204 01/30/16 0904  HGB 11.0*  --   --   --  9.8*  --   HCT 38.1  --   --   --  34.5*  --   PLT 346  --   --   --  327  --   APTT 53*  --   < > 60* 54* 54*  LABPROT 15.9*  --   --   --   --   --   INR  1.25  --   --   --   --   --   HEPARINUNFRC 2.20* 1.32*  --   --  0.29*  --   CREATININE 0.94 0.96  --   --   --   --   < > = values in this interval not displayed.  Estimated Creatinine Clearance: 54.4 mL/min (by C-G formula based on Cr of 0.96).   Medications:  Scheduled:  . aspirin EC  81 mg Oral Daily  . diltiazem  180 mg Oral q morning - 10a  . docusate sodium  200 mg Oral QHS  . ezetimibe  10 mg Oral QHS  . flecainide  50 mg Oral BID  . lisinopril  5 mg Oral QHS  . sodium chloride flush  3 mL Intravenous Q12H  . sodium chloride flush  3 mL Intravenous Q12H  . sodium chloride flush  3 mL Intravenous Q12H  . [START ON 01/31/2016] vancomycin  1,000 mg Intravenous To OR   Infusions:  . sodium chloride 50 mL/hr at 01/28/16 1844  . heparin 1,200 Units/hr (01/30/16 0317)    Assessment: [name] with hx PAF on Apixaban PTA for hx afib.  Pt was admitted with concern for RLE  embolus.  Pt s/p angiogram today showing RLE thrombosis.  Recommending R femoropopliteal bypass and cascular surgery consult placed. In the interim, pt has been bridged with IV heparin infusion.    Heparin remains subtherapeutic despite rate increase this morning.  Noted plans to d.c heparin at midnight in anticipation of procedure tomorrow.  Goal of Therapy:  Heparin level 0.3-0.7 units/ml aPTT 66-102 seconds Monitor platelets by anticoagulation protocol: Yes   Plan:  Increase heparin to 1350 units/hr Check aPTT at 8pm Heparin off at midnight Follow-up post-op for anticoag plans (Eliquis PTA)  Horton Chin, Pharm.D., BCPS Clinical Pharmacist Pager [phone] 01/30/2016 1:13 PM

## 2016-01-30 NOTE — Anesthesia Preprocedure Evaluation (Addendum)
Anesthesia Evaluation  Patient identified by MRN, date of birth, ID band Patient awake    Reviewed: Allergy & Precautions, H&P , NPO status , Patient's Chart, lab work & pertinent test results  Airway Mallampati: II  TM Distance: >3 FB Neck ROM: Full    Dental no notable dental hx. (+) Upper Dentures, Edentulous Lower, Dental Advisory Given   Pulmonary neg pulmonary ROS, former smoker,    Pulmonary exam normal breath sounds clear to auscultation       Cardiovascular hypertension, Pt. on medications + CAD, + Past MI and + Peripheral Vascular Disease  + dysrhythmias Atrial Fibrillation  Rhythm:Regular Rate:Normal     Neuro/Psych  Headaches, CVA negative psych ROS   GI/Hepatic Neg liver ROS, GERD  Medicated and Controlled,  Endo/Other  negative endocrine ROS  Renal/GU negative Renal ROS  negative genitourinary   Musculoskeletal   Abdominal   Peds  Hematology negative hematology ROS (+)   Anesthesia Other Findings   Reproductive/Obstetrics negative OB ROS                            Anesthesia Physical Anesthesia Plan  ASA: III  Anesthesia Plan: General   Post-op Pain Management:    Induction: Intravenous  Airway Management Planned: Oral ETT  Additional Equipment:   Intra-op Plan:   Post-operative Plan: Extubation in OR  Informed Consent: I have reviewed the patients History and Physical, chart, labs and discussed the procedure including the risks, benefits and alternatives for the proposed anesthesia with the patient or authorized representative who has indicated his/her understanding and acceptance.   Dental advisory given  Plan Discussed with: CRNA  Anesthesia Plan Comments:         Anesthesia Quick Evaluation

## 2016-01-30 NOTE — Progress Notes (Signed)
ANTICOAGULATION CONSULT NOTE - Follow Up Consult  Pharmacy Consult for Heparin Indication: atrial fibrillation, PVD  Allergies  Allergen Reactions  . Prednisone Other (See Comments)    Muscle spasms  . Statins Itching and Other (See Comments)    Simvastatin-caused severe itching Pravastatin-caused lesser tiching One type of statin? Caused stroke in 2006, and other symptoms as result  . Warfarin And Related Other (See Comments)    Caused nose bleeds  . Lactose Intolerance (Gi) Other (See Comments)    Bloating, gas  . Penicillins Hives and Other (See Comments)    Questionable high fever when taken and broke out in whelps.   . Simvastatin Itching and Rash    Also lipitor intolerant  . Atenolol Rash  . Crestor [Rosuvastatin] Itching and Rash  . Demerol [Meperidine] Other (See Comments)    Unknown, can't remember   . Effexor [Venlafaxine Hcl] Other (See Comments)    Doesn't remember   . Inderal [Propranolol] Other (See Comments)    Doesn't remember   . Lipitor [Atorvastatin] Rash  . Lovaza [Omega-3-Acid Ethyl Esters] Other (See Comments)    Doesn't remember   . Nexium [Esomeprazole Magnesium] Rash  . Percocet [Oxycodone-Acetaminophen] Other (See Comments)    Doesn't remember   . Plavix [Clopidogrel Bisulfate] Rash  . Pravastatin Itching  . Trilipix [Choline Fenofibrate] Other (See Comments)    Doesn't remember     Patient Measurements: Height: 5' 5.5" (166.4 cm) Weight: 142 lb 3.2 oz (64.501 kg) IBW/kg (Calculated) : 58.15 Heparin Dosing Weight: 65 kg  Vital Signs: Temp: 98.2 F (36.8 C) (02/09 2048) Temp Source: Oral (02/09 2048) BP: 166/71 mmHg (02/09 2048) Pulse Rate: 82 (02/09 2048)  Labs:  Recent Labs  01/28/16 1724 01/29/16 0035  01/30/16 0204 01/30/16 0904 01/30/16 1952  HGB 11.0*  --   --  9.8*  --   --   HCT 38.1  --   --  34.5*  --   --   PLT 346  --   --  327  --   --   APTT 53*  --   < > 54* 54* 69*  LABPROT 15.9*  --   --   --   --   --    INR 1.25  --   --   --   --   --   HEPARINUNFRC 2.20* 1.32*  --  0.29*  --   --   CREATININE 0.94 0.96  --   --   --   --   < > = values in this interval not displayed.  Estimated Creatinine Clearance: 54.4 mL/min (by C-G formula based on Cr of 0.96).   Medications:  Scheduled:  . aspirin EC  81 mg Oral Daily  . diltiazem  180 mg Oral q morning - 10a  . docusate sodium  200 mg Oral QHS  . ezetimibe  10 mg Oral QHS  . flecainide  50 mg Oral BID  . lisinopril  5 mg Oral QHS  . sodium chloride flush  3 mL Intravenous Q12H  . sodium chloride flush  3 mL Intravenous Q12H  . sodium chloride flush  3 mL Intravenous Q12H  . [START ON 01/31/2016] vancomycin  1,000 mg Intravenous To OR   Infusions:  . sodium chloride 50 mL/hr at 01/28/16 1844  . heparin 1,350 Units/hr (01/30/16 1328)    Assessment: [name] with hx PAF on Apixaban PTA for hx afib.  Pt was admitted with concern for RLE  embolus.  Pt s/p angiogram today showing RLE thrombosis.  Recommending R femoropopliteal bypass and cascular surgery consult placed. In the interim, pt has been bridged with IV heparin infusion.    Heparin now therapeutic with PTT = 69.  Noted plans to d.c heparin at midnight in anticipation of procedure tomorrow.  No bleeding or complications noted currently.  Goal of Therapy:  Heparin level 0.3-0.7 units/ml aPTT 66-102 seconds Monitor platelets by anticoagulation protocol: Yes   Plan:  Continue IV heparin at current rate. Heparin off at midnight Follow-up post-op for anticoag plans (Eliquis PTA)  Uvaldo Rising, BCPS  Clinical Pharmacist Pager [phone]  01/30/2016 8:50 PM

## 2016-01-30 NOTE — Progress Notes (Addendum)
[facility]  Subjective  - She was found to have an occlusion of the previously placed covered stent of the right SFA throughout its course with 3 vessel runoff below the knee with evidence of some embolization of distal branches. The left iliac stents are patent with chronically occluded left SFA with good collaterals from the profunda and three vessel runoff below the knee. She has been recommended to have a right femoral to popliteal bypass grafting. She is currently on heparin.  Objective 138/55 67 98 F (36.7 C) (Oral) 18 98%  Intake/Output Summary (Last 24 hours) at 01/30/16 0717 Last data filed at 01/30/16 [id number]  Gross per 24 hour  Intake    680 ml  Output   2700 ml  Net  -2020 ml   Right foot cool with dusky 4th toe Doppler PT only weak signal No open wounds   Assessment/Planning: 66 y.o. female with PAD with acute ischemia of right leg s/p aortogram with BLE runoff  Plan Fem pop by pass by Dr. Kellie Simmering tomorrow NPO past MN  [place], [facility] 01/30/2016 7:17 AM --  Laboratory Lab Results:  Recent Labs  01/28/16 1724 01/30/16 0204  WBC 16.9* 14.8*  HGB 11.0* 9.8*  HCT 38.1 34.5*  PLT 346 327   BMET  Recent Labs  01/28/16 1724 01/29/16 0035  NA 138 138  K 4.4 3.7  CL 103 103  CO2 25 24  GLUCOSE 85 132*  BUN 20 22*  CREATININE 0.94 0.96  CALCIUM 9.6 9.1    COAG Lab Results  Component Value Date   INR 1.25 01/28/2016   INR 1.15 06/25/2015   INR 1.38 03/20/2014   No results found for: PTT  Right leg discomfort is better today Plan right femoral-popliteal bypass graft tomorrow with either saphenous vein or PTFE depending on size of the vein-it appears borderline on vein mapping  Risk and benefits thoroughly discussed with patient she would like to proceed Will DC heparin drip tonight at midnight  And resume anticoagulation a few days postop

## 2016-01-30 NOTE — Progress Notes (Signed)
Subjective:  No CP/SOB. Still having some RLE pain. Results of PV angio noted and personally reviewed  Objective:  Temp:  [98 F (36.7 C)-98.1 F (36.7 C)] 98 F (36.7 C) (02/09 0607) Pulse Rate:  [67-86] 67 (02/09 0607) Resp:  [18] 18 (02/09 0607) BP: (133-186)/(55-80) 138/55 mmHg (02/09 0607) SpO2:  [97 %-98 %] 98 % (02/09 0607) Weight:  [142 lb 3.2 oz (64.501 kg)] 142 lb 3.2 oz (64.501 kg) (02/09 0607) Weight change: -1 lb 12.2 oz (-0.799 kg)  Intake/Output from previous day: 02/08 0701 - 02/09 0700 In: 680 [P.O.:680] Out: 2700 [Urine:2700]  Intake/Output from this shift: Total I/O In: 300 [P.O.:300] Out: 450 [Urine:450]  Physical Exam: General appearance: alert and no distress Neck: no adenopathy, no carotid bruit, no JVD, supple, symmetrical, trachea midline and thyroid not enlarged, symmetric, no tenderness/mass/nodules Lungs: clear to auscultation bilaterally Heart: regular rate and rhythm, S1, S2 normal, no murmur, click, rub or gallop Extremities: extremities normal, atraumatic, no cyanosis or edema  Lab Results: Results for orders placed or performed during the hospital encounter of 01/28/16 (from the past 48 hour(s))  Basic metabolic panel     Status: None   Collection Time: 01/28/16  5:24 PM  Result Value Ref Range   Sodium 138 135 - 145 mmol/L   Potassium 4.4 3.5 - 5.1 mmol/L   Chloride 103 101 - 111 mmol/L   CO2 25 22 - 32 mmol/L   Glucose, Bld 85 65 - 99 mg/dL   BUN 20 6 - 20 mg/dL   Creatinine, Ser 0.94 0.44 - 1.00 mg/dL   Calcium 9.6 8.9 - 10.3 mg/dL   GFR calc non Af Amer >60 >60 mL/min   GFR calc Af Amer >60 >60 mL/min    Comment: (NOTE) The eGFR has been calculated using the CKD EPI equation. This calculation has not been validated in all clinical situations. eGFR's persistently <60 mL/min signify possible Chronic Kidney Disease.    Anion gap 10 5 - 15  CBC WITH DIFFERENTIAL     Status: Abnormal   Collection Time: 01/28/16  5:24 PM    Result Value Ref Range   WBC 16.9 (H) 4.0 - 10.5 K/uL   RBC 5.51 (H) 3.87 - 5.11 MIL/uL   Hemoglobin 11.0 (L) 12.0 - 15.0 g/dL   HCT 38.1 36.0 - 46.0 %   MCV 69.1 (L) 78.0 - 100.0 fL   MCH 20.0 (L) 26.0 - 34.0 pg   MCHC 28.9 (L) 30.0 - 36.0 g/dL   RDW 19.5 (H) 11.5 - 15.5 %   Platelets 346 150 - 400 K/uL   Neutrophils Relative % 86 %   Lymphocytes Relative 7 %   Monocytes Relative 3 %   Eosinophils Relative 3 %   Basophils Relative 1 %   Neutro Abs 14.5 (H) 1.7 - 7.7 K/uL   Lymphs Abs 1.2 0.7 - 4.0 K/uL   Monocytes Absolute 0.5 0.1 - 1.0 K/uL   Eosinophils Absolute 0.5 0.0 - 0.7 K/uL   Basophils Absolute 0.2 (H) 0.0 - 0.1 K/uL   RBC Morphology ELLIPTOCYTES    Smear Review LARGE PLATELETS PRESENT   Protime-INR     Status: Abnormal   Collection Time: 01/28/16  5:24 PM  Result Value Ref Range   Prothrombin Time 15.9 (H) 11.6 - 15.2 seconds   INR 1.25 0.00 - 1.49  APTT     Status: Abnormal   Collection Time: 01/28/16  5:24 PM  Result Value Ref Range  aPTT 53 (H) 24 - 37 seconds    Comment:        IF BASELINE aPTT IS ELEVATED, SUGGEST PATIENT RISK ASSESSMENT BE USED TO DETERMINE APPROPRIATE ANTICOAGULANT THERAPY.   Heparin level (unfractionated)     Status: Abnormal   Collection Time: 01/28/16  5:24 PM  Result Value Ref Range   Heparin Unfractionated 2.20 (H) 0.30 - 0.70 IU/mL    Comment: RESULTS CONFIRMED BY MANUAL DILUTION        IF HEPARIN RESULTS ARE BELOW EXPECTED VALUES, AND PATIENT DOSAGE HAS BEEN CONFIRMED, SUGGEST FOLLOW UP TESTING OF ANTITHROMBIN III LEVELS.   Hemoglobin A1c     Status: Abnormal   Collection Time: 01/28/16  6:20 PM  Result Value Ref Range   Hgb A1c MFr Bld 5.7 (H) 4.8 - 5.6 %    Comment: (NOTE)         Pre-diabetes: 5.7 - 6.4         Diabetes: >6.4         Glycemic control for adults with diabetes: <7.0    Mean Plasma Glucose 117 mg/dL    Comment: (NOTE) Performed At: [facility] [place], [place]  [id number] Lindon Romp MD VO:[id number]   Heparin level (unfractionated)     Status: Abnormal   Collection Time: 01/29/16 12:35 AM  Result Value Ref Range   Heparin Unfractionated 1.32 (H) 0.30 - 0.70 IU/mL    Comment: RESULTS CONFIRMED BY MANUAL DILUTION        IF HEPARIN RESULTS ARE BELOW EXPECTED VALUES, AND PATIENT DOSAGE HAS BEEN CONFIRMED, SUGGEST FOLLOW UP TESTING OF ANTITHROMBIN III LEVELS.   Comprehensive metabolic panel     Status: Abnormal   Collection Time: 01/29/16 12:35 AM  Result Value Ref Range   Sodium 138 135 - 145 mmol/L   Potassium 3.7 3.5 - 5.1 mmol/L   Chloride 103 101 - 111 mmol/L   CO2 24 22 - 32 mmol/L   Glucose, Bld 132 (H) 65 - 99 mg/dL   BUN 22 (H) 6 - 20 mg/dL   Creatinine, Ser 0.96 0.44 - 1.00 mg/dL   Calcium 9.1 8.9 - 10.3 mg/dL   Total Protein 6.2 (L) 6.5 - 8.1 g/dL   Albumin 3.6 3.5 - 5.0 g/dL   AST 20 15 - 41 U/L   ALT 13 (L) 14 - 54 U/L   Alkaline Phosphatase 54 38 - 126 U/L   Total Bilirubin 0.4 0.3 - 1.2 mg/dL   GFR calc non Af Amer >60 >60 mL/min   GFR calc Af Amer >60 >60 mL/min    Comment: (NOTE) The eGFR has been calculated using the CKD EPI equation. This calculation has not been validated in all clinical situations. eGFR's persistently <60 mL/min signify possible Chronic Kidney Disease.    Anion gap 11 5 - 15  APTT     Status: Abnormal   Collection Time: 01/29/16 12:36 AM  Result Value Ref Range   aPTT 60 (H) 24 - 37 seconds    Comment:        IF BASELINE aPTT IS ELEVATED, SUGGEST PATIENT RISK ASSESSMENT BE USED TO DETERMINE APPROPRIATE ANTICOAGULANT THERAPY.   APTT     Status: Abnormal   Collection Time: 01/29/16  7:30 AM  Result Value Ref Range   aPTT 60 (H) 24 - 37 seconds    Comment:        IF BASELINE aPTT IS ELEVATED, SUGGEST PATIENT RISK ASSESSMENT BE USED TO DETERMINE APPROPRIATE  ANTICOAGULANT THERAPY.   POCT Activated clotting time     Status: None   Collection Time: 01/29/16  9:01 AM  Result Value  Ref Range   Activated Clotting Time 157 seconds  Heparin level (unfractionated)     Status: Abnormal   Collection Time: 01/30/16  2:04 AM  Result Value Ref Range   Heparin Unfractionated 0.29 (L) 0.30 - 0.70 IU/mL    Comment:        IF HEPARIN RESULTS ARE BELOW EXPECTED VALUES, AND PATIENT DOSAGE HAS BEEN CONFIRMED, SUGGEST FOLLOW UP TESTING OF ANTITHROMBIN III LEVELS.   CBC     Status: Abnormal   Collection Time: 01/30/16  2:04 AM  Result Value Ref Range   WBC 14.8 (H) 4.0 - 10.5 K/uL   RBC 5.01 3.87 - 5.11 MIL/uL   Hemoglobin 9.8 (L) 12.0 - 15.0 g/dL   HCT 34.5 (L) 36.0 - 46.0 %   MCV 68.9 (L) 78.0 - 100.0 fL   MCH 19.6 (L) 26.0 - 34.0 pg   MCHC 28.4 (L) 30.0 - 36.0 g/dL   RDW 19.4 (H) 11.5 - 15.5 %   Platelets 327 150 - 400 K/uL    Comment: SPECIMEN CHECKED FOR CLOTS REPEATED TO VERIFY PLATELET COUNT CONFIRMED BY SMEAR   APTT     Status: Abnormal   Collection Time: 01/30/16  2:04 AM  Result Value Ref Range   aPTT 54 (H) 24 - 37 seconds    Comment:        IF BASELINE aPTT IS ELEVATED, SUGGEST PATIENT RISK ASSESSMENT BE USED TO DETERMINE APPROPRIATE ANTICOAGULANT THERAPY.   APTT     Status: Abnormal   Collection Time: 01/30/16  9:04 AM  Result Value Ref Range   aPTT 54 (H) 24 - 37 seconds    Comment:        IF BASELINE aPTT IS ELEVATED, SUGGEST PATIENT RISK ASSESSMENT BE USED TO DETERMINE APPROPRIATE ANTICOAGULANT THERAPY.     Imaging: Imaging results have been reviewed  Tele- NSR (personally reviewed)  Assessment/Plan:   1. Active Problems: 2.   Claudication ([place]) 3.   Time Spent Directly with Patient:  20 minutes  Length of Stay:  LOS: 2 days   Pt is S/P PV angio by Dr. Fletcher Anon revealing an occluded RSFA from the origin down to Hinters canal with 3 vessel R/O. This was the entire Viabahn stented segment. I agree that she will need RFPBG which Dr. Kellie Simmering is planning on performing tomorrow. She is on IV hep (Eliquis is on hold which she is on for  PAF). She had a cath by Dr. Ellyn Hack 5/14 revealing non obstructive CAD. She is cleared for vascular surgery at low CV risk. We will need to re start Eliquis post op when surgery  feels safe from a bleeding perspective which she is on for PAF.  Quay Burow 01/30/2016, 10:25 AM

## 2016-01-31 ENCOUNTER — Inpatient Hospital Stay (HOSPITAL_COMMUNITY): Payer: Medicare Other | Admitting: Anesthesiology

## 2016-01-31 ENCOUNTER — Encounter (HOSPITAL_COMMUNITY)
Admission: AD | Disposition: A | Discharge status: Home Health | Payer: Self-pay | Source: Ambulatory Visit | Attending: Cardiovascular Disease

## 2016-01-31 ENCOUNTER — Inpatient Hospital Stay (HOSPITAL_COMMUNITY): Payer: Medicare Other

## 2016-01-31 HISTORY — PX: LOWER EXTREMITY ANGIOGRAM: SHX5508

## 2016-01-31 HISTORY — PX: FEMORAL-POPLITEAL BYPASS GRAFT: SHX937

## 2016-01-31 HISTORY — PX: VEIN REPAIR: SHX6524

## 2016-01-31 HISTORY — PX: PATCH ANGIOPLASTY: SHX6230

## 2016-01-31 LAB — BASIC METABOLIC PANEL
Anion gap: 9 (ref 5–15)
BUN: 22 mg/dL — AB (ref 6–20)
CALCIUM: 8.8 mg/dL — AB (ref 8.9–10.3)
CO2: 26 mmol/L (ref 22–32)
CREATININE: 0.87 mg/dL (ref 0.44–1.00)
Chloride: 100 mmol/L — ABNORMAL LOW (ref 101–111)
GFR calc Af Amer: >60 mL/min (ref >60)
Glucose, Bld: 119 mg/dL — ABNORMAL HIGH (ref 65–99)
POTASSIUM: 4.3 mmol/L (ref 3.5–5.1)
SODIUM: 135 mmol/L (ref 135–145)

## 2016-01-31 LAB — CBC
HCT: 34.4 % — ABNORMAL LOW (ref 36.0–46.0)
Hemoglobin: 9.8 g/dL — ABNORMAL LOW (ref 12.0–15.0)
MCH: 19.6 pg — ABNORMAL LOW (ref 26.0–34.0)
MCHC: 28.5 g/dL — ABNORMAL LOW (ref 30.0–36.0)
MCV: 68.8 fL — ABNORMAL LOW (ref 78.0–100.0)
PLATELETS: 344 10*3/uL (ref 150–400)
RBC: 5 MIL/uL (ref 3.87–5.11)
RDW: 19.4 % — AB (ref 11.5–15.5)
WBC: 16.5 10*3/uL — AB (ref 4.0–10.5)

## 2016-01-31 LAB — HEPARIN LEVEL (UNFRACTIONATED)

## 2016-01-31 LAB — ABO/RH: ABO/RH(D): A POS

## 2016-01-31 LAB — SURGICAL PCR SCREEN
MRSA, PCR: NEGATIVE
STAPHYLOCOCCUS AUREUS: NEGATIVE

## 2016-01-31 LAB — TYPE AND SCREEN
ABO/RH(D): A POS
ANTIBODY SCREEN: NEGATIVE

## 2016-01-31 LAB — PROTIME-INR
INR: 1.09 (ref 0.00–1.49)
Prothrombin Time: 14.3 seconds (ref 11.6–15.2)

## 2016-01-31 IMAGING — CR DG ANG/EXT/UNI/OR RIGHT
1 series · 1 of 1 positions shown · non-contrast
Comparison: None

CLINICAL DATA: Right-sided fem-pop bypass grafting

EXAM:
RIGHT FRANZ ABEL/EXT/UNI/ OR

[AP]
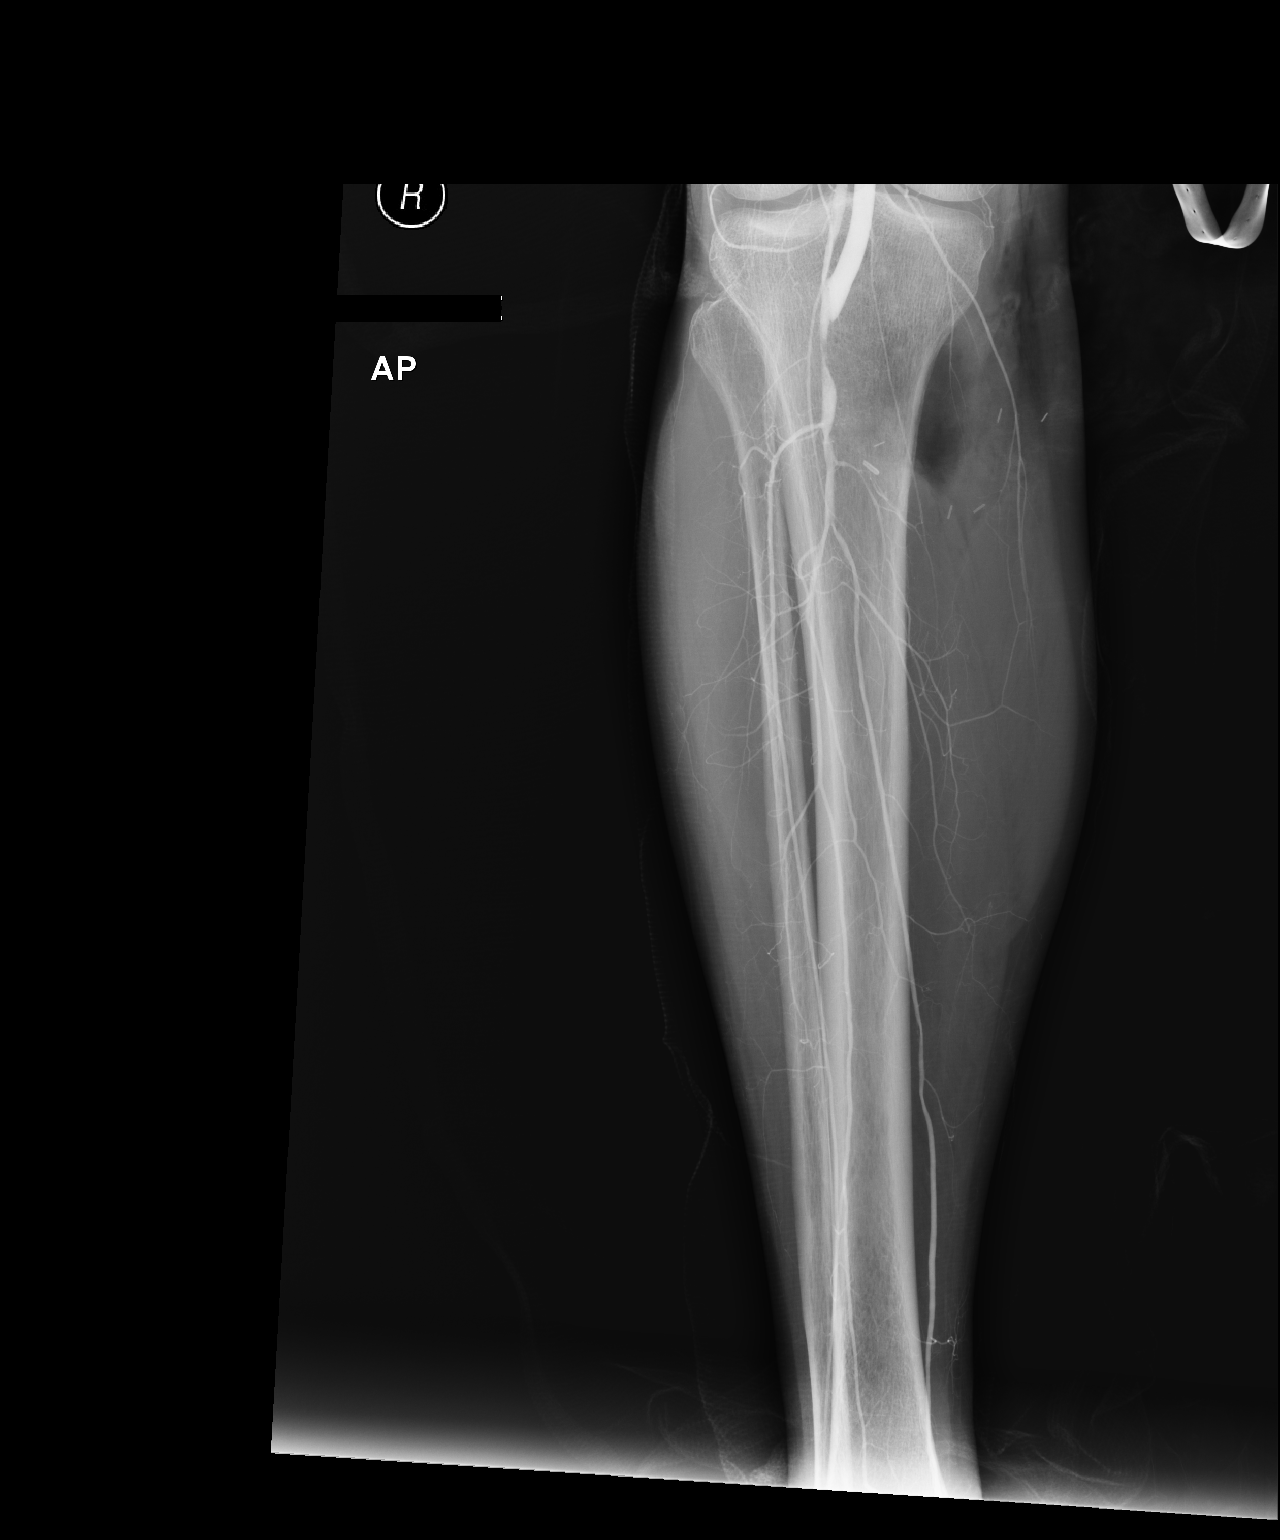

[1 of 1 positions shown; findings below may reference images not displayed]

FINDINGS: A single spot intraoperative angiographic image of the right lower
leg is provided for review.

Intra-arterial contrast has been injected from a more proximal
location.

There is ossification of the distal aspect of a presumed synthetic
bypass graft which anastomoses with the distal aspect of the
below-knee popliteal artery. The distal anastomosis appears widely
patent. No contrast extravasation.

There is smooth moderate narrowing within the perianastomotic
segment of the more distal popliteal artery. There is an apparent
three-vessel runoff to the imaged lower leg.

No discrete intraluminal filling defect to suggest distal embolism.

Several surgical clips are noted about the operative site with
expected subcutaneous emphysema. No radiopaque foreign body.
IMPRESSION: Post right-sided below-knee bypass graft without evidence of
complication.

## 2016-01-31 SURGERY — BYPASS GRAFT FEMORAL-POPLITEAL ARTERY
Anesthesia: General | Site: Leg Upper | Laterality: Right

## 2016-01-31 MED ORDER — HEMOSTATIC AGENTS (NO CHARGE) OPTIME
TOPICAL | Status: DC | PRN
  Administered 2016-01-31: 1 via TOPICAL

## 2016-01-31 MED ORDER — 0.9 % SODIUM CHLORIDE (POUR BTL) OPTIME
TOPICAL | Status: DC | PRN
  Administered 2016-01-31: 2000 mL

## 2016-01-31 MED ORDER — PROTAMINE SULFATE 10 MG/ML IV SOLN
INTRAVENOUS | Status: DC | PRN
  Administered 2016-01-31: 20 mg via INTRAVENOUS
  Administered 2016-01-31: 10 mg via INTRAVENOUS
  Administered 2016-01-31: 20 mg via INTRAVENOUS

## 2016-01-31 MED ORDER — LACTATED RINGERS IV SOLN
INTRAVENOUS | Status: DC | PRN
  Administered 2016-01-31 (×2): via INTRAVENOUS

## 2016-01-31 MED ORDER — ONDANSETRON HCL 4 MG/2ML IJ SOLN
INTRAMUSCULAR | Status: DC | PRN
  Administered 2016-01-31: 4 mg via INTRAVENOUS

## 2016-01-31 MED ORDER — ONDANSETRON HCL 4 MG/2ML IJ SOLN
INTRAMUSCULAR | Status: AC
  Filled 2016-01-31: qty 2

## 2016-01-31 MED ORDER — VANCOMYCIN HCL IN DEXTROSE 1-5 GM/200ML-% IV SOLN
INTRAVENOUS | Status: AC
  Filled 2016-01-31: qty 200

## 2016-01-31 MED ORDER — FENTANYL CITRATE (PF) 100 MCG/2ML IJ SOLN
INTRAMUSCULAR | Status: DC | PRN
  Administered 2016-01-31 (×5): 50 ug via INTRAVENOUS

## 2016-01-31 MED ORDER — SODIUM CHLORIDE 0.9 % IV SOLN
INTRAVENOUS | Status: DC | PRN
  Administered 2016-01-31: 500 mL

## 2016-01-31 MED ORDER — POTASSIUM CHLORIDE CRYS ER 20 MEQ PO TBCR: 20.0000 meq | EXTENDED_RELEASE_TABLET | Freq: Every day | ORAL | Status: DC | PRN

## 2016-01-31 MED ORDER — PHENOL 1.4 % MT LIQD: 1.0000 | OROMUCOSAL | Status: DC | PRN

## 2016-01-31 MED ORDER — SODIUM CHLORIDE 0.9 % IV SOLN: Freq: Once | INTRAVENOUS | Status: DC

## 2016-01-31 MED ORDER — HEPARIN SODIUM (PORCINE) 1000 UNIT/ML IJ SOLN
INTRAMUSCULAR | Status: AC
  Filled 2016-01-31: qty 2

## 2016-01-31 MED ORDER — PHENYLEPHRINE HCL 10 MG/ML IJ SOLN
INTRAMUSCULAR | Status: DC | PRN
  Administered 2016-01-31: 80 ug via INTRAVENOUS
  Administered 2016-01-31 (×2): 40 ug via INTRAVENOUS
  Administered 2016-01-31: 80 ug via INTRAVENOUS
  Administered 2016-01-31: 40 ug via INTRAVENOUS

## 2016-01-31 MED ORDER — SUGAMMADEX SODIUM 200 MG/2ML IV SOLN
INTRAVENOUS | Status: DC | PRN
  Administered 2016-01-31: 150 mg via INTRAVENOUS

## 2016-01-31 MED ORDER — SODIUM CHLORIDE 0.9 % IV SOLN
INTRAVENOUS | Status: DC
  Administered 2016-01-31 – 2016-02-03 (×5): via INTRAVENOUS

## 2016-01-31 MED ORDER — LIDOCAINE HCL (CARDIAC) 20 MG/ML IV SOLN
INTRAVENOUS | Status: AC
  Filled 2016-01-31: qty 5

## 2016-01-31 MED ORDER — PROTAMINE SULFATE 10 MG/ML IV SOLN
INTRAVENOUS | Status: AC
  Filled 2016-01-31: qty 5

## 2016-01-31 MED ORDER — ROCURONIUM BROMIDE 50 MG/5ML IV SOLN
INTRAVENOUS | Status: AC
  Filled 2016-01-31: qty 1

## 2016-01-31 MED ORDER — HYDROMORPHONE HCL 1 MG/ML IJ SOLN
0.2500 mg | INTRAMUSCULAR | Status: DC | PRN
  Administered 2016-01-31 (×2): 0.5 mg via INTRAVENOUS

## 2016-01-31 MED ORDER — FENTANYL CITRATE (PF) 250 MCG/5ML IJ SOLN
INTRAMUSCULAR | Status: AC
  Filled 2016-01-31: qty 5

## 2016-01-31 MED ORDER — MORPHINE SULFATE (PF) 2 MG/ML IV SOLN
2.0000 mg | INTRAVENOUS | Status: DC | PRN
  Administered 2016-01-31 (×2): 2 mg via INTRAVENOUS
  Administered 2016-02-01 (×3): 3 mg via INTRAVENOUS
  Administered 2016-02-01 – 2016-02-02 (×4): 2 mg via INTRAVENOUS
  Administered 2016-02-02: 3 mg via INTRAVENOUS
  Administered 2016-02-02 – 2016-02-05 (×4): 2 mg via INTRAVENOUS
  Administered 2016-02-06: 3 mg via INTRAVENOUS
  Filled 2016-01-31: qty 1
  Filled 2016-01-31: qty 2
  Filled 2016-01-31 (×2): qty 1
  Filled 2016-01-31 (×2): qty 2
  Filled 2016-01-31 (×2): qty 1
  Filled 2016-01-31: qty 2
  Filled 2016-01-31 (×3): qty 1
  Filled 2016-01-31: qty 2
  Filled 2016-01-31 (×2): qty 1

## 2016-01-31 MED ORDER — BISACODYL 10 MG RE SUPP: 10.0000 mg | Freq: Every day | RECTAL | Status: DC | PRN

## 2016-01-31 MED ORDER — GUAIFENESIN-DM 100-10 MG/5ML PO SYRP: 15.0000 mL | ORAL_SOLUTION | ORAL | Status: DC | PRN

## 2016-01-31 MED ORDER — ROCURONIUM BROMIDE 100 MG/10ML IV SOLN
INTRAVENOUS | Status: DC | PRN
  Administered 2016-01-31: 10 mg via INTRAVENOUS
  Administered 2016-01-31: 50 mg via INTRAVENOUS

## 2016-01-31 MED ORDER — HYDROMORPHONE HCL 1 MG/ML IJ SOLN
INTRAMUSCULAR | Status: AC
  Filled 2016-01-31: qty 1

## 2016-01-31 MED ORDER — MIDAZOLAM HCL 2 MG/2ML IJ SOLN
INTRAMUSCULAR | Status: AC
  Filled 2016-01-31: qty 2

## 2016-01-31 MED ORDER — IOHEXOL 300 MG/ML  SOLN
INTRAMUSCULAR | Status: DC | PRN
  Administered 2016-01-31: 30 mL via INTRAVENOUS

## 2016-01-31 MED ORDER — PROPOFOL 10 MG/ML IV BOLUS
INTRAVENOUS | Status: DC | PRN
  Administered 2016-01-31: 100 mg via INTRAVENOUS

## 2016-01-31 MED ORDER — ALUM & MAG HYDROXIDE-SIMETH 200-200-20 MG/5ML PO SUSP: 15.0000 mL | ORAL | Status: DC | PRN

## 2016-01-31 MED ORDER — SODIUM CHLORIDE 0.9 % IV SOLN: 500.0000 mL | Freq: Once | INTRAVENOUS | Status: DC | PRN

## 2016-01-31 MED ORDER — PROPOFOL 10 MG/ML IV BOLUS
INTRAVENOUS | Status: AC
  Filled 2016-01-31: qty 20

## 2016-01-31 MED ORDER — ASPIRIN EC 81 MG PO TBEC
81.0000 mg | DELAYED_RELEASE_TABLET | Freq: Every day | ORAL | Status: DC
  Administered 2016-02-01 – 2016-02-07 (×6): 81 mg via ORAL
  Filled 2016-01-31 (×7): qty 1

## 2016-01-31 MED ORDER — HEPARIN SODIUM (PORCINE) 1000 UNIT/ML IJ SOLN
INTRAMUSCULAR | Status: DC | PRN
  Administered 2016-01-31: 6 mL via INTRAVENOUS

## 2016-01-31 MED ORDER — MIDAZOLAM HCL 5 MG/5ML IJ SOLN
INTRAMUSCULAR | Status: DC | PRN
  Administered 2016-01-31 (×2): 1 mg via INTRAVENOUS

## 2016-01-31 MED ORDER — HEPARIN (PORCINE) IN NACL 100-0.45 UNIT/ML-% IJ SOLN
800.0000 [IU]/h | INTRAMUSCULAR | Status: DC
  Administered 2016-01-31 – 2016-02-01 (×2): 800 [IU]/h via INTRAVENOUS
  Filled 2016-01-31 (×2): qty 250

## 2016-01-31 MED ORDER — VANCOMYCIN HCL IN DEXTROSE 1-5 GM/200ML-% IV SOLN
1000.0000 mg | Freq: Two times a day (BID) | INTRAVENOUS | Status: AC
  Administered 2016-01-31 – 2016-02-01 (×2): 1000 mg via INTRAVENOUS
  Filled 2016-01-31 (×3): qty 200

## 2016-01-31 MED ORDER — PHENYLEPHRINE HCL 10 MG/ML IJ SOLN
10.0000 mg | INTRAMUSCULAR | Status: DC | PRN
  Administered 2016-01-31: 15 ug/min via INTRAVENOUS

## 2016-01-31 MED ORDER — DIPHENHYDRAMINE HCL 50 MG/ML IJ SOLN
INTRAMUSCULAR | Status: AC
  Filled 2016-01-31: qty 1

## 2016-01-31 MED ORDER — LIDOCAINE HCL (CARDIAC) 20 MG/ML IV SOLN
INTRAVENOUS | Status: DC | PRN
  Administered 2016-01-31: 60 mg via INTRAVENOUS

## 2016-01-31 SURGICAL SUPPLY — 64 items
BANDAGE ESMARK 6X9 LF (GAUZE/BANDAGES/DRESSINGS) IMPLANT
BNDG CMPR 9X6 STRL LF SNTH (GAUZE/BANDAGES/DRESSINGS)
BNDG ESMARK 6X9 LF (GAUZE/BANDAGES/DRESSINGS)
BOOT SUTURE AID YELLOW STND (SUTURE) ×1 IMPLANT
CANISTER SUCTION 2500CC (MISCELLANEOUS) ×3 IMPLANT
CANNULA VESSEL 3MM 2 BLNT TIP (CANNULA) ×1 IMPLANT
CLIP TI MEDIUM 24 (CLIP) ×3 IMPLANT
CLIP TI WIDE RED SMALL 24 (CLIP) ×3 IMPLANT
COVER SURGICAL LIGHT HANDLE (MISCELLANEOUS) ×1 IMPLANT
DECANTER SPIKE VIAL GLASS SM (MISCELLANEOUS) IMPLANT
DRAIN SNY 10X20 3/4 PERF (WOUND CARE) IMPLANT
DRAPE PROXIMA HALF (DRAPES) IMPLANT
DRAPE X-RAY CASS 24X20 (DRAPES) ×1 IMPLANT
ELECT REM PT RETURN 9FT ADLT (ELECTROSURGICAL) ×3
ELECTRODE REM PT RTRN 9FT ADLT (ELECTROSURGICAL) ×2 IMPLANT
EVACUATOR SILICONE 100CC (DRAIN) IMPLANT
GLOVE BIO SURGEON STRL SZ 6.5 (GLOVE) ×4 IMPLANT
GLOVE BIO SURGEON STRL SZ7.5 (GLOVE) ×1 IMPLANT
GLOVE BIOGEL PI IND STRL 6.5 (GLOVE) IMPLANT
GLOVE BIOGEL PI IND STRL 7.0 (GLOVE) IMPLANT
GLOVE BIOGEL PI IND STRL 8 (GLOVE) IMPLANT
GLOVE BIOGEL PI INDICATOR 6.5 (GLOVE) ×4
GLOVE BIOGEL PI INDICATOR 7.0 (GLOVE) ×1
GLOVE BIOGEL PI INDICATOR 8 (GLOVE) ×1
GLOVE ECLIPSE 6.5 STRL STRAW (GLOVE) ×2 IMPLANT
GLOVE SS BIOGEL STRL SZ 7 (GLOVE) ×2 IMPLANT
GLOVE SUPERSENSE BIOGEL SZ 7 (GLOVE) ×1
GLOVE SURG SS PI 6.5 STRL IVOR (GLOVE) ×2 IMPLANT
GOWN STRL REUS W/ TWL LRG LVL3 (GOWN DISPOSABLE) ×6 IMPLANT
GOWN STRL REUS W/TWL LRG LVL3 (GOWN DISPOSABLE) ×21
GRAFT PROPATEN THIN WALL 6X80 (Vascular Products) ×1 IMPLANT
INSERT FOGARTY SM (MISCELLANEOUS) ×3 IMPLANT
KIT BASIN OR (CUSTOM PROCEDURE TRAY) ×3 IMPLANT
KIT ROOM TURNOVER OR (KITS) ×3 IMPLANT
LIQUID BAND (GAUZE/BANDAGES/DRESSINGS) ×9 IMPLANT
LOOP VESSEL MAXI BLUE (MISCELLANEOUS) ×2 IMPLANT
LOOP VESSEL MINI RED (MISCELLANEOUS) ×2 IMPLANT
MARKER SKIN DUAL TIP RULER LAB (MISCELLANEOUS) ×1 IMPLANT
NS IRRIG 1000ML POUR BTL (IV SOLUTION) ×6 IMPLANT
PACK PERIPHERAL VASCULAR (CUSTOM PROCEDURE TRAY) ×3 IMPLANT
PAD ARMBOARD 7.5X6 YLW CONV (MISCELLANEOUS) ×6 IMPLANT
PADDING CAST COTTON 6X4 STRL (CAST SUPPLIES) IMPLANT
PATCH HEMASHIELD 8X150 (Vascular Products) ×1 IMPLANT
SET COLLECT BLD 21X3/4 12 (NEEDLE) ×1 IMPLANT
SPONGE INTESTINAL PEANUT (DISPOSABLE) ×1 IMPLANT
STOPCOCK 4 WAY LG BORE MALE ST (IV SETS) ×1 IMPLANT
SUT PROLENE 5 0 C1 (SUTURE) IMPLANT
SUT PROLENE 6 0 BV (SUTURE) ×1 IMPLANT
SUT PROLENE 6 0 C 1 24 (SUTURE) ×1 IMPLANT
SUT PROLENE 6 0 CC (SUTURE) ×7 IMPLANT
SUT SILK 2 0 SH (SUTURE) ×3 IMPLANT
SUT SILK 3 0 (SUTURE)
SUT SILK 3-0 18XBRD TIE 12 (SUTURE) IMPLANT
SUT VIC AB 2-0 CTX 27 (SUTURE) ×1 IMPLANT
SUT VIC AB 2-0 CTX 36 (SUTURE) ×6 IMPLANT
SUT VIC AB 3-0 SH 27 (SUTURE) ×12
SUT VIC AB 3-0 SH 27X BRD (SUTURE) ×4 IMPLANT
SUT VIC AB 4-0 PS2 27 (SUTURE) ×1 IMPLANT
SYR 20CC LL (SYRINGE) ×2 IMPLANT
SYR 30ML LL (SYRINGE) ×1 IMPLANT
TRAY FOLEY W/METER SILVER 16FR (SET/KITS/TRAYS/PACK) ×3 IMPLANT
TUBING EXTENTION W/L.L. (IV SETS) ×1 IMPLANT
UNDERPAD 30X30 INCONTINENT (UNDERPADS AND DIAPERS) ×3 IMPLANT
WATER STERILE IRR 1000ML POUR (IV SOLUTION) ×3 IMPLANT

## 2016-01-31 NOTE — Op Note (Signed)
OPERATIVE REPORT  Date of Surgery: 01/28/2016 - 01/31/2016  Surgeon: [name], MD  Assistant: Dr. 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 The great saphenous vein was imaged using B mode ultrasound-sono site and thought to be very marginal in size to just below the knee. The right leg was then prepped and draped in routine sterile manner longitudinal incision made in the right inguinal area carried down through subcutaneous tissue. Femoral vessels were then dissected free exposing up to the external iliac artery proximally and the superficial femoral had been previously stented and was also dissected free. The profunda was dissected down past its secondary branches. There was severe concentric calcified plaque throughout the whole common femoral artery extending into the profunda about 3-4 cm and the superficial femoral was chronically occluded. The great saphenous vein was then examined and was borderline but adequate at the saphenofemoral junction but it was then exposed through 3 small incisions on the medial aspect of the leg and was felt to be very marginal in size at bedrest so it was decided not to use  the vein but to leave it in place so it was not removed. Short incision was made above the need to examine the above-knee popliteal artery and it was diffusely calcified and diseased. Medial incision was then made below the knee. The popliteal artery was calcified particularly distally beginning at about 4-5 cm proximal to the origin of the anterior tibial artery which also was calcified as well as the tibioperoneal trunk. There was a soft area in the proximal popliteal artery. Subfascial anatomic tunnel was created and a 6 mm Gore-Tex-propaten graft was delivered through the tunnel patient was then heparinized. External iliac artery was occluded proximally and the profunda branches distally and longitudinal opening made in the common femoral artery which was heavily calcified arthrotomy was extended up above the inguinal ligament and the external iliac artery where the vessel became soft. The plaque was almost totally occlusive in nature. R Barbaraann Rondo was extended down the profunda to the primary branches and the plaque extended down to this point. Endarterectomy was then performed from the external iliac down to and including about 2-3 cm of the profunda where the plaque feathered off and there was good lumen distally. The previous stent had been placed in the superficial femoral artery was completely partially excised at its origin in order to sew the patch. Dacron patch was then fashioned appropriately and sewn in place with continuous 6-0 Prolene over this large long arthrotomy. Following this attention returned to the popliteal artery below the knee which was occluded proximally and distally with Vesseloops opened 15 blade extended with Potts scissors in the proximal portion where it was soft. I did not extend this down into the distal popliteal artery because of the severe calcification. I did pass a 3 dilator down the popliteal artery distal to the  arthrotomy down to the origin of the tibioperoneal trunk and  although it was diffusely diseased dilator would easily pass. Following this Kellie Simmering did the distal anastomosis and Dr. Doren Custard did the proximal anastomosis after making a longitudinal R Barbaraann Rondo in the patch and spatulating the Gore-Tex and anastomosing it inside with 6-0 Prolene proximally. Distally in the side anastomosis was done between Gore-Tex and popliteal artery with 6-0 Prolene. Once again prior to completion of the anastomosis the 3 dilator was passed distally through this diffusely diseased popliteal artery and it was widely patent up to 3 mm. Both anastomoses were completed after appropriate flushing and clamps released and there was a good pulse in the distal graft and occluded popliteal pulse distally. Intraoperative arteriogram was performed which revealed the bypass to be widely patent. There was a concerning area just distal to the anastomosis where it had tapered down over about 2 cm distance in the popliteal artery appeared to be much better distal to this point although in examining the vessel it was diffusely diseased throughout and this calcification continued into the distal vessels. This may well have been some spasm in the distal popliteal artery causing the narrowing on the angiogram. I did listen to this with a Doppler over about a 15 or 20 minute period and also continued to palpate the pulse which was excellent distal to this area of concern it was decided would be safer not to reopen this vessel because of the diffuse calcification. Therefore protamine was given to reverse the heparin following adequate hemostasis wounds closed in layers with Vicryl subcuticular fashion with Dermabond patient taken to recovery in satisfactory condition [name], MD 01/31/2016 11:18 AM

## 2016-01-31 NOTE — Transfer of Care (Signed)
Immediate Anesthesia Transfer of Care Note  Patient: [name]  Procedure(s) Performed: Procedure(s): BYPASS GRAFT RIGHT COMMON  FEMORALTO BELOW KNEE POPLITEAL ARTERY BYPASS GRAFT USING 6MM PROPATEN GORTEX GRAFT (Right) INTRAOP RIGHT LOWER EXTREMITY ANGIOGRAM (Right) RIGHT COMMON FEMORAL AND PROFUNDA FEMORIS ENDARECTOMY WITH PATCH ANGIOPLASTY (Right) RIGHT GREATER SAPHENOUS VEIN EXAMNED BUT NOT REMOVED (Right)  Patient Location: PACU  Anesthesia Type:General  Level of Consciousness: awake and alert   Airway & Oxygen Therapy: Patient Spontanous Breathing and Patient connected to nasal cannula oxygen  Post-op Assessment: Report given to RN, Post -op Vital signs reviewed and stable and Patient moving all extremities X 4  Post vital signs: Reviewed and stable  Last Vitals:  Filed Vitals:   01/30/16 2048 01/31/16 0544  BP: 166/71 132/59  Pulse: 82 66  Temp: 36.8 C 36.7 C  Resp: 18 18    Complications: No apparent anesthesia complications

## 2016-01-31 NOTE — Interval H&P Note (Signed)
History and Physical Interval Note:  01/31/2016 7:29 AM  [name]  has presented today for surgery, with the diagnosis of Peripheral Arterial Disease I70.92 Acute Ischemia right lower extremity M62.89  The various methods of treatment have been discussed with the patient and family. After consideration of risks, benefits and other options for treatment, the patient has consented to  Procedure(s): BYPASS GRAFT FEMORAL-POPLITEAL ARTERY (Right) as a surgical intervention .  The patient's history has been reviewed, patient examined, no change in status, stable for surgery.  I have reviewed the patient's chart and labs.  Questions were answered to the patient's satisfaction.     [name]

## 2016-01-31 NOTE — H&P (View-Only) (Signed)
[facility] [place] [facility]  Subjective  - She was found to have an occlusion of the previously placed covered stent of the right SFA throughout its course with 3 vessel runoff below the knee with evidence of some embolization of distal branches. The left iliac stents are patent with chronically occluded left SFA with good collaterals from the profunda and three vessel runoff below the knee. She has been recommended to have a right femoral to popliteal bypass grafting. She is currently on heparin.  Objective 138/55 67 98 F (36.7 C) (Oral) 18 98%  Intake/Output Summary (Last 24 hours) at 01/30/16 0717 Last data filed at 01/30/16 [id number]  Gross per 24 hour  Intake    680 ml  Output   2700 ml  Net  -2020 ml   Right foot cool with dusky 4th toe Doppler PT only weak signal No open wounds   Assessment/Planning: 65 y.o. female with PAD with acute ischemia of right leg s/p aortogram with BLE runoff  Plan Fem pop by pass by Dr. Kellie Simmering tomorrow NPO past MN  [place], [facility] 01/30/2016 7:17 AM --  Laboratory Lab Results:  Recent Labs  01/28/16 1724 01/30/16 0204  WBC 16.9* 14.8*  HGB 11.0* 9.8*  HCT 38.1 34.5*  PLT 346 327   BMET  Recent Labs  01/28/16 1724 01/29/16 0035  NA 138 138  K 4.4 3.7  CL 103 103  CO2 25 24  GLUCOSE 85 132*  BUN 20 22*  CREATININE 0.94 0.96  CALCIUM 9.6 9.1    COAG Lab Results  Component Value Date   INR 1.25 01/28/2016   INR 1.15 06/25/2015   INR 1.38 03/20/2014   No results found for: PTT  Right leg discomfort is better today Plan right femoral-popliteal bypass graft tomorrow with either saphenous vein or PTFE depending on size of the vein-it appears borderline on vein mapping  Risk and benefits thoroughly discussed with patient she would like to proceed Will DC heparin drip tonight at midnight  And resume anticoagulation a few days postop

## 2016-01-31 NOTE — Evaluation (Signed)
Physical Therapy Evaluation Patient Details Name: [name] MRN: [id number] DOB: Sep 08, 1951 Today's Date: 01/31/2016   History of Present Illness  Adm 01/28/16 with RLE claudication/pain. 2/8 angiogram with multiple blockages; 2/10 Rt fem-pop BPG  PMHx- PVD (previous LE stents), afib on anticoagulation, CVA, MI, Lt knee surgery,   Clinical Impression  Patient is s/p above surgery resulting in functional limitations due to the deficits listed below (see PT Problem List). Patient has limited assist on discharge and needs to be as independent as possible prior to discharge. If nausea and pain improve, anticipate good progress.  Patient will benefit from skilled PT to increase their independence and safety with mobility to allow discharge to the venue listed below.       Follow Up Recommendations Home health PT;Supervision - Intermittent (if slow to progress, will need CIR vs SNF)    Equipment Recommendations  Rolling walker with 5" wheels;3in1 (PT)    Recommendations for Other Services OT consult     Precautions / Restrictions Precautions Precautions: Fall      Mobility  Bed Mobility Overal bed mobility: Needs Assistance Bed Mobility: Supine to Sit;Sit to Supine     Supine to sit: Min assist Sit to supine: Min assist   General bed mobility comments: assist to torso and then to RLE   Transfers                 General transfer comment: pt refused due to nausea, fuzzy feeling  Ambulation/Gait                Stairs            Wheelchair Mobility    Modified Rankin (Stroke Patients Only)       Balance Overall balance assessment: Needs assistance Sitting-balance support: No upper extremity supported;Feet supported Sitting balance-Leahy Scale: Fair Sitting balance - Comments: no reaching outside BOS due to dizzy, nausea, pain                                     Pertinent Vitals/Pain Pain Assessment: Faces Faces Pain Scale: Hurts  even more Pain Location: Rt groin Pain Descriptors / Indicators: Operative site guarding Pain Intervention(s): Limited activity within patient's tolerance;Monitored during session;Repositioned    Home Living Family/patient expects to be discharged to:: Private residence Living Arrangements: Other relatives (65 yo and 58 yo grandchildren) Available Help at Discharge: Friend(s);Available PRN/intermittently;Family (?sister can help some) Type of Home: House Home Access: Stairs to enter Entrance Stairs-Rails: Psychiatric nurse of Steps: 4 Home Layout: One level Home Equipment: None      Prior Function Level of Independence: Independent               Hand Dominance        Extremity/Trunk Assessment   Upper Extremity Assessment: Overall WFL for tasks assessed           Lower Extremity Assessment: RLE deficits/detail RLE Deficits / Details: AROM hip flexion 80, knee flexion 60, ankle DF 10 (causes pain in calf); MMT deferred but able to move against gravity    Cervical / Trunk Assessment: Normal  Communication   Communication: No difficulties  Cognition Arousal/Alertness: Lethargic;Suspect due to medications Behavior During Therapy: [facility] for tasks assessed/performed Overall Cognitive Status: Within Functional Limits for tasks assessed  General Comments      Exercises General Exercises - Lower Extremity Ankle Circles/Pumps: AROM;Both;5 reps Quad Sets: AROM;Right;Other reps (comment)      Assessment/Plan    PT Assessment Patient needs continued PT services  PT Diagnosis Acute pain;Difficulty walking   PT Problem List Decreased range of motion;Decreased activity tolerance;Decreased balance;Decreased mobility;Decreased knowledge of use of DME;Impaired sensation;Pain;Decreased skin integrity  PT Treatment Interventions DME instruction;Gait training;Stair training;Functional mobility training;Therapeutic  activities;Therapeutic exercise;Patient/family education   PT Goals (Current goals can be found in the Care Plan section) Acute Rehab PT Goals Patient Stated Goal: be able to be alone during day PT Goal Formulation: With patient Time For Goal Achievement: 02/05/16 Potential to Achieve Goals: Good    Frequency Min 4X/week   Barriers to discharge Decreased caregiver support primarily reliant on her grandchildren and thinks her sister "maybe" will help with groceries, driving    Co-evaluation               End of Session Equipment Utilized During Treatment: Oxygen Activity Tolerance: Treatment limited secondary to medical complications (Comment) Patient left: in bed;with call bell/phone within reach Nurse Communication: Mobility status         Time: [id number] PT Time Calculation (min) (ACUTE ONLY): 15 min   Charges:   PT Evaluation $PT Eval Low Complexity: 1 Procedure     PT G Codes:        Ingram Onnen 2016/02/29, 4:51 PM Pager [phone]

## 2016-01-31 NOTE — Anesthesia Procedure Notes (Signed)
Date/Time: 01/31/2016 7:47 AM Performed by: Ollen Bowl Pre-anesthesia Checklist: Patient identified, Emergency Drugs available, Suction available and Patient being monitored Patient Re-evaluated:Patient Re-evaluated prior to inductionOxygen Delivery Method: Circle system utilized Preoxygenation: Pre-oxygenation with 100% oxygen Intubation Type: IV induction Ventilation: Mask ventilation without difficulty Laryngoscope Size: Mac and 3 Grade View: Grade I Tube type: Oral Tube size: 7.0 mm Number of attempts: 2 (1st attempt with Mil 2 by SRNA, patient has large tonuge and is edentulous on the uppers. 2nd attempt by SRNA with Mac 3 successful without difficulty) Airway Equipment and Method: Stylet Placement Confirmation: positive ETCO2 and breath sounds checked- equal and bilateral Secured at: 21 cm Tube secured with: Tape Dental Injury: Teeth and Oropharynx as per pre-operative assessment

## 2016-01-31 NOTE — Care Management Note (Signed)
Case Management Note  Patient Details  Name: CHARNEL WOLANIN MRN: [id number] Date of Birth: 1951-01-20  Subjective/Objective:   Date: 01/31/16 Spoke with patient at the bedside.  Introduced self as Tourist information centre manager and explained role in discharge planning and how to be reached.  Verified patient lives in [place], with her two grand daughters who help her if needed.  Expressed potential need for no  DME.  Verified patient anticipates to go home with family at time of discharge and will have part-time supervision by family at this time to best of their knowledge.  Patient denied needing help with their medication.  Patient drives or uses scat to MD appointments.  Verified patient has PCP Murray Hodgkins.   Plan: CM will continue to follow for discharge planning and [facility] resources.                  Action/Plan:   Expected Discharge Date:                  Expected Discharge Plan:  [place]  In-House Referral:     Discharge planning Services  CM Consult  Post Acute Care Choice:    Choice offered to:     DME Arranged:    DME Agency:     HH Arranged:    [place] Agency:     Status of Service:  In process, will continue to follow  Medicare Important Message Given:    Date Medicare IM Given:    Medicare IM give by:    Date Additional Medicare IM Given:    Additional Medicare Important Message give by:     If discussed at [place] of Stay Meetings, dates discussed:    Additional Comments:  Zenon Mayo, RN 01/31/2016, 4:08 PM

## 2016-01-31 NOTE — Anesthesia Postprocedure Evaluation (Signed)
Anesthesia Post Note  Patient: Benelli B Gunderman  Procedure(s) Performed: Procedure(s) (LRB): BYPASS GRAFT RIGHT COMMON  FEMORALTO BELOW KNEE POPLITEAL ARTERY BYPASS GRAFT USING 6MM PROPATEN GORTEX GRAFT (Right) INTRAOP RIGHT LOWER EXTREMITY ANGIOGRAM (Right) RIGHT COMMON FEMORAL AND PROFUNDA FEMORIS ENDARECTOMY WITH PATCH ANGIOPLASTY (Right) RIGHT GREATER SAPHENOUS VEIN EXAMNED BUT NOT REMOVED (Right)  Patient location during evaluation: PACU Anesthesia Type: General Level of consciousness: awake and alert Pain management: pain level controlled Vital Signs Assessment: post-procedure vital signs reviewed and stable Respiratory status: spontaneous breathing, nonlabored ventilation, respiratory function stable and patient connected to nasal cannula oxygen Cardiovascular status: blood pressure returned to baseline and stable Postop Assessment: no signs of nausea or vomiting Anesthetic complications: no    Last Vitals:  Filed Vitals:   01/31/16 1245 01/31/16 1246  BP:  107/52  Pulse: 64 64  Temp:    Resp: 11 10    Last Pain:  Filed Vitals:   01/31/16 1258  PainSc: 8                  Keyen Marban S

## 2016-02-01 ENCOUNTER — Encounter (HOSPITAL_COMMUNITY)
Admission: AD | Disposition: A | Discharge status: Home Health | Payer: Self-pay | Source: Ambulatory Visit | Attending: Cardiovascular Disease

## 2016-02-01 ENCOUNTER — Encounter (HOSPITAL_COMMUNITY): Payer: Self-pay | Admitting: Anesthesiology

## 2016-02-01 ENCOUNTER — Inpatient Hospital Stay (HOSPITAL_COMMUNITY): Payer: Medicare Other | Admitting: Anesthesiology

## 2016-02-01 HISTORY — PX: EMBOLECTOMY: SHX44

## 2016-02-01 LAB — BASIC METABOLIC PANEL
ANION GAP: 12 (ref 5–15)
BUN: 15 mg/dL (ref 6–20)
CALCIUM: 8.4 mg/dL — AB (ref 8.9–10.3)
CO2: 24 mmol/L (ref 22–32)
Chloride: 102 mmol/L (ref 101–111)
Creatinine, Ser: 1.05 mg/dL — ABNORMAL HIGH (ref 0.44–1.00)
GFR calc Af Amer: >60 mL/min (ref >60)
GFR, EST NON AFRICAN AMERICAN: 55 mL/min — AB (ref >60)
GLUCOSE: 138 mg/dL — AB (ref 65–99)
Potassium: 3.9 mmol/L (ref 3.5–5.1)
Sodium: 138 mmol/L (ref 135–145)

## 2016-02-01 LAB — HEPARIN LEVEL (UNFRACTIONATED): Heparin Unfractionated: <0.1 IU/mL — ABNORMAL LOW (ref 0.30–0.70)

## 2016-02-01 LAB — CBC
HEMATOCRIT: 29.4 % — AB (ref 36.0–46.0)
Hemoglobin: 8.6 g/dL — ABNORMAL LOW (ref 12.0–15.0)
MCH: 20.2 pg — AB (ref 26.0–34.0)
MCHC: 29.3 g/dL — ABNORMAL LOW (ref 30.0–36.0)
MCV: 69.2 fL — AB (ref 78.0–100.0)
PLATELETS: 317 10*3/uL (ref 150–400)
RBC: 4.25 MIL/uL (ref 3.87–5.11)
RDW: 19.8 % — AB (ref 11.5–15.5)
WBC: 19.3 10*3/uL — ABNORMAL HIGH (ref 4.0–10.5)

## 2016-02-01 SURGERY — EMBOLECTOMY
Anesthesia: General | Site: Leg Lower | Laterality: Right

## 2016-02-01 MED ORDER — PROPOFOL 10 MG/ML IV BOLUS
INTRAVENOUS | Status: DC | PRN
  Administered 2016-02-01: 20 mg via INTRAVENOUS
  Administered 2016-02-01: 150 mg via INTRAVENOUS
  Administered 2016-02-01: 20 mg via INTRAVENOUS

## 2016-02-01 MED ORDER — PAPAVERINE HCL 30 MG/ML IJ SOLN
INTRAMUSCULAR | Status: AC
  Filled 2016-02-01: qty 2

## 2016-02-01 MED ORDER — PHENYLEPHRINE HCL 10 MG/ML IJ SOLN
INTRAMUSCULAR | Status: DC | PRN
  Administered 2016-02-01: 80 ug via INTRAVENOUS
  Administered 2016-02-01: 40 ug via INTRAVENOUS

## 2016-02-01 MED ORDER — MIDAZOLAM HCL 5 MG/5ML IJ SOLN
INTRAMUSCULAR | Status: DC | PRN
  Administered 2016-02-01 (×2): 1 mg via INTRAVENOUS

## 2016-02-01 MED ORDER — HEPARIN SODIUM (PORCINE) 1000 UNIT/ML IJ SOLN
INTRAMUSCULAR | Status: AC
  Filled 2016-02-01: qty 1

## 2016-02-01 MED ORDER — FENTANYL CITRATE (PF) 250 MCG/5ML IJ SOLN
INTRAMUSCULAR | Status: AC
  Filled 2016-02-01: qty 5

## 2016-02-01 MED ORDER — FENTANYL CITRATE (PF) 250 MCG/5ML IJ SOLN
INTRAMUSCULAR | Status: DC | PRN
  Administered 2016-02-01: 100 ug via INTRAVENOUS
  Administered 2016-02-01 (×3): 50 ug via INTRAVENOUS

## 2016-02-01 MED ORDER — LIDOCAINE HCL (CARDIAC) 20 MG/ML IV SOLN
INTRAVENOUS | Status: DC | PRN
  Administered 2016-02-01: 40 mg via INTRATRACHEAL

## 2016-02-01 MED ORDER — SUCCINYLCHOLINE CHLORIDE 20 MG/ML IJ SOLN
INTRAMUSCULAR | Status: DC | PRN
  Administered 2016-02-01: 100 mg via INTRAVENOUS

## 2016-02-01 MED ORDER — MIDAZOLAM HCL 2 MG/2ML IJ SOLN
INTRAMUSCULAR | Status: AC
  Filled 2016-02-01: qty 2

## 2016-02-01 MED ORDER — PROPOFOL 10 MG/ML IV BOLUS
INTRAVENOUS | Status: AC
  Filled 2016-02-01: qty 20

## 2016-02-01 MED ORDER — LACTATED RINGERS IV SOLN
INTRAVENOUS | Status: DC | PRN
  Administered 2016-02-01 (×2): via INTRAVENOUS

## 2016-02-01 MED ORDER — VANCOMYCIN HCL 1000 MG IV SOLR
1000.0000 mg | INTRAVENOUS | Status: DC | PRN
  Administered 2016-02-01: 1000 mg via INTRAVENOUS

## 2016-02-01 MED ORDER — HEPARIN SODIUM (PORCINE) 1000 UNIT/ML IJ SOLN
INTRAMUSCULAR | Status: DC | PRN
  Administered 2016-02-01: 5000 [IU] via INTRAVENOUS

## 2016-02-01 MED ORDER — 0.9 % SODIUM CHLORIDE (POUR BTL) OPTIME
TOPICAL | Status: DC | PRN
  Administered 2016-02-01: 1000 mL

## 2016-02-01 MED ORDER — SODIUM CHLORIDE 0.9 % IV SOLN
INTRAVENOUS | Status: DC | PRN
  Administered 2016-02-01: 500 mL

## 2016-02-01 MED ORDER — THROMBIN 20000 UNITS EX SOLR
CUTANEOUS | Status: AC
  Filled 2016-02-01: qty 20000

## 2016-02-01 MED ORDER — VANCOMYCIN HCL IN DEXTROSE 1-5 GM/200ML-% IV SOLN
INTRAVENOUS | Status: AC
  Filled 2016-02-01: qty 200

## 2016-02-01 SURGICAL SUPPLY — 54 items
ADH SKN CLS APL DERMABOND .7 (GAUZE/BANDAGES/DRESSINGS) ×2
ARMBAND PINK RESTRICT EXTREMIT (MISCELLANEOUS) ×1 IMPLANT
CANISTER SUCTION 2500CC (MISCELLANEOUS) ×4 IMPLANT
CANNULA VESSEL 3MM 2 BLNT TIP (CANNULA) ×3 IMPLANT
CATH EMB 4FR 80CM (CATHETERS) ×4 IMPLANT
CLIP TI MEDIUM 6 (CLIP) ×4 IMPLANT
CLIP TI WIDE RED SMALL 6 (CLIP) ×4 IMPLANT
DERMABOND ADVANCED (GAUZE/BANDAGES/DRESSINGS) ×2
DERMABOND ADVANCED .7 DNX12 (GAUZE/BANDAGES/DRESSINGS) ×1 IMPLANT
DRAIN CHANNEL 15F RND FF W/TCR (WOUND CARE) ×3 IMPLANT
DRAPE INCISE IOBAN 85X60 (DRAPES) ×3 IMPLANT
DRAPE ORTHO SPLIT 77X108 STRL (DRAPES) ×8
DRAPE PROXIMA HALF (DRAPES) ×3 IMPLANT
DRAPE SURG ORHT 6 SPLT 77X108 (DRAPES) ×2 IMPLANT
DRAPE X-RAY CASS 24X20 (DRAPES) IMPLANT
ELECT REM PT RETURN 9FT ADLT (ELECTROSURGICAL) ×4
ELECTRODE REM PT RTRN 9FT ADLT (ELECTROSURGICAL) ×2 IMPLANT
GAUZE SPONGE 2X2 8PLY STRL LF (GAUZE/BANDAGES/DRESSINGS) ×1 IMPLANT
GAUZE SPONGE 4X4 16PLY XRAY LF (GAUZE/BANDAGES/DRESSINGS) IMPLANT
GEL ULTRASOUND 20GR AQUASONIC (MISCELLANEOUS) IMPLANT
GLOVE BIO SURGEON STRL SZ7.5 (GLOVE) ×4 IMPLANT
GLOVE BIOGEL PI IND STRL 7.0 (GLOVE) ×3 IMPLANT
GLOVE BIOGEL PI IND STRL 7.5 (GLOVE) ×1 IMPLANT
GLOVE BIOGEL PI IND STRL 8 (GLOVE) ×2 IMPLANT
GLOVE BIOGEL PI INDICATOR 7.0 (GLOVE) ×6
GLOVE BIOGEL PI INDICATOR 7.5 (GLOVE) ×2
GLOVE BIOGEL PI INDICATOR 8 (GLOVE) ×2
GOWN STRL REUS W/ TWL LRG LVL3 (GOWN DISPOSABLE) ×6 IMPLANT
GOWN STRL REUS W/TWL LRG LVL3 (GOWN DISPOSABLE) ×12
KIT BASIN OR (CUSTOM PROCEDURE TRAY) ×4 IMPLANT
KIT ROOM TURNOVER OR (KITS) ×4 IMPLANT
LIQUID BAND (GAUZE/BANDAGES/DRESSINGS) ×4 IMPLANT
NS IRRIG 1000ML POUR BTL (IV SOLUTION) ×4 IMPLANT
PACK CV ACCESS (CUSTOM PROCEDURE TRAY) ×4 IMPLANT
PAD ARMBOARD 7.5X6 YLW CONV (MISCELLANEOUS) ×8 IMPLANT
PATCH VASC XENOSURE 1CMX6CM (Vascular Products) ×4 IMPLANT
PATCH VASC XENOSURE 1X6 (Vascular Products) ×1 IMPLANT
SET COLLECT BLD 21X3/4 12 (NEEDLE) IMPLANT
SPONGE GAUZE 2X2 STER 10/PKG (GAUZE/BANDAGES/DRESSINGS) ×2
SPONGE SURGIFOAM ABS GEL 100 (HEMOSTASIS) IMPLANT
STOPCOCK 4 WAY LG BORE MALE ST (IV SETS) IMPLANT
SUT ETHILON 3 0 PS 1 (SUTURE) ×3 IMPLANT
SUT PROLENE 6 0 BV (SUTURE) ×10 IMPLANT
SUT VIC AB 3-0 SH 27 (SUTURE) ×4
SUT VIC AB 3-0 SH 27X BRD (SUTURE) ×2 IMPLANT
SUT VICRYL 4-0 PS2 18IN ABS (SUTURE) ×4 IMPLANT
SYR 3ML LL SCALE MARK (SYRINGE) ×3 IMPLANT
SYR BULB IRRIGATION 50ML (SYRINGE) ×3 IMPLANT
TAPE CLOTH SURG 4X10 WHT LF (GAUZE/BANDAGES/DRESSINGS) ×3 IMPLANT
TOWEL OR 17X26 10 PK STRL BLUE (TOWEL DISPOSABLE) ×3 IMPLANT
TRAY FOLEY CATH 16FRSI W/METER (SET/KITS/TRAYS/PACK) ×3 IMPLANT
TUBING EXTENTION W/L.L. (IV SETS) IMPLANT
UNDERPAD 30X30 INCONTINENT (UNDERPADS AND DIAPERS) ×4 IMPLANT
WATER STERILE IRR 1000ML POUR (IV SOLUTION) ×4 IMPLANT

## 2016-02-01 NOTE — Anesthesia Procedure Notes (Signed)
Procedure Name: Intubation Date/Time: 02/01/2016 10:35 PM Performed by: Maude Leriche D Pre-anesthesia Checklist: Patient identified, Emergency Drugs available, Suction available, Patient being monitored and Timeout performed Patient Re-evaluated:Patient Re-evaluated prior to inductionOxygen Delivery Method: Circle system utilized Preoxygenation: Pre-oxygenation with 100% oxygen Intubation Type: IV induction, Rapid sequence and Cricoid Pressure applied Laryngoscope Size: Miller and 2 Grade View: Grade I Tube type: Oral Tube size: 8.0 mm Number of attempts: 1 Airway Equipment and Method: Stylet Placement Confirmation: ETT inserted through vocal cords under direct vision,  positive ETCO2 and breath sounds checked- equal and bilateral Secured at: 21 cm Tube secured with: Tape Dental Injury: Teeth and Oropharynx as per pre-operative assessment

## 2016-02-01 NOTE — Progress Notes (Signed)
    Subjective:  Denies CP or dyspnea; complains of RLE pain   Objective:  Filed Vitals:   01/31/16 2328 02/01/16 0333 02/01/16 0754 02/01/16 1141  BP: 122/46 122/49 132/50 121/52  Pulse: 95 88 89 80  Temp: 98 F (36.7 C) 98.1 F (36.7 C) 98.9 F (37.2 C) 99.1 F (37.3 C)  TempSrc: Oral Oral Oral Oral  Resp: 19 15 22 20   Height:      Weight:  147 lb 11.3 oz (67 kg)    SpO2: 93% 93% 92% 97%    Intake/Output from previous day:  Intake/Output Summary (Last 24 hours) at 02/01/16 1142 Last data filed at 02/01/16 1100  Gross per 24 hour  Intake 2900.87 ml  Output   1200 ml  Net 1700.87 ml    Physical Exam: Physical exam: Well-developed well-nourished in no acute distress.  Skin is warm and dry.  HEENT is normal.  Neck is supple.  Chest is clear to auscultation with normal expansion.  Cardiovascular exam is regular rate and rhythm.  Abdominal exam nontender or distended. No masses palpated. Extremities show no edema. S/p RLE PV surgery neuro grossly intact    Lab Results: Basic Metabolic Panel:  Recent Labs  01/31/16 0240 02/01/16 0600  NA 135 138  K 4.3 3.9  CL 100* 102  CO2 26 24  GLUCOSE 119* 138*  BUN 22* 15  CREATININE 0.87 1.05*  CALCIUM 8.8* 8.4*   CBC:  Recent Labs  01/31/16 0240 02/01/16 0600  WBC 16.5* 19.3*  HGB 9.8* 8.6*  HCT 34.4* 29.4*  MCV 68.8* 69.2*  PLT 344 317     Assessment/Plan:  1 status post Right femoral to below-knee popliteal artery bypass-management per vascular surgery. Continue aspirin. Intolerant to statins. 2 history of paroxysmal atrial fibrillation-continue Cardizem and flecainide. Resume apixaban when ok with surgery. 3 hypertension-continue present blood pressure medications. 4 History of polycythemia vera  Kirk Ruths 02/01/2016, 11:42 AM

## 2016-02-01 NOTE — Progress Notes (Signed)
   VASCULAR SURGERY ASSESSMENT & PLAN:  * 1 Day Post-Op s/p: right femoral to below knee popliteal artery bypass with PTFE graft, right common femoral artery and deep femoral artery endarterectomy with Dacron patch angioplasty  *  Good Doppler flow in right foot  * Transfer to 2 W.  * Mobilize  * Anticoagulation: Dr. Kellie Simmering would like to not restart the [facility] was for 48 hours. Continue heparin at 800 units an hour until then.  SUBJECTIVE: Pain well controlled.  PHYSICAL EXAM: Filed Vitals:   01/31/16 1948 01/31/16 2030 01/31/16 2328 02/01/16 0333  BP: 130/59  122/46 122/49  Pulse: 86  95 88  Temp: 97.8 F (36.6 C)  98 F (36.7 C) 98.1 F (36.7 C)  TempSrc: Oral  Oral Oral  Resp: 18  19 15   Height:      Weight:    147 lb 11.3 oz (67 kg)  SpO2: 99% 98% 93% 93%   Brisk Doppler signals in the right foot and the posterior tibial, anterior tibial, and peroneal positions. Incisions all look fine.  LABS: Lab Results  Component Value Date   WBC 19.3* 02/01/2016   HGB 8.6* 02/01/2016   HCT 29.4* 02/01/2016   MCV 69.2* 02/01/2016   PLT 317 02/01/2016   Lab Results  Component Value Date   CREATININE 1.05* 02/01/2016   Lab Results  Component Value Date   INR 1.09 01/31/2016   Active Problems:   Claudication [facility])  Gae Gallop Beeper: [id number] 02/01/2016

## 2016-02-01 NOTE — Progress Notes (Signed)
Physical Therapy Treatment Patient Details Name: [name] MRN: [id number] DOB: 08-29-1951 Today's Date: 02/01/2016    History of Present Illness Adm 01/28/16 with RLE claudication/pain. 2/8 angiogram with multiple blockages; 2/10 Rt fem-pop BPG  PMHx- PVD (previous LE stents), afib on anticoagulation, CVA, MI, Lt knee surgery,     PT Comments    Pt with very limited tolerance for mobility due to pain in R LE and diminished sensation.  Difficult D/C plan as pt indicates her 65 yr old and 64 yr old granddaughters are her support at home.  Pt indicates her sister can "stop by some" and she states she has a neighbor that is a CNA who can check on her "sometimes after work".    Also, concern that pt states the neighbor is the one "checking on the girls" while the pt is in the hospital.  Spoke with RN about pt's granddaughters and feel SW involvement may be warranted.    Follow Up Recommendations  Home health PT;Supervision - Intermittent     Equipment Recommendations  Rolling walker with 5" wheels;3in1 (PT)    Recommendations for Other Services       Precautions / Restrictions Precautions Precautions: Fall Restrictions Weight Bearing Restrictions: No    Mobility  Bed Mobility Overal bed mobility: Needs Assistance Bed Mobility: Sit to Supine       Sit to supine: Min guard   General bed mobility comments: pt able to bring LEs back to bed, however discussed use of belt to A with lifting R LE into bed at home as pt's bed much taller than hospital bed.    Transfers Overall transfer level: Needs assistance Equipment used: Rolling walker (2 wheeled) Transfers: Sit to/from Stand Sit to Stand: Min guard         General transfer comment: cues for UE use and controlling descent to sitting.    Ambulation/Gait Ambulation/Gait assistance: Min assist Ambulation Distance (Feet): 12 Feet Assistive device: Rolling walker (2 wheeled) Gait Pattern/deviations: Step-to  pattern;Decreased stance time - right;Decreased stride length;Decreased dorsiflexion - right;Decreased weight shift to right;Antalgic;Trunk flexed     General Gait Details: pt minimally weightbearing on R LE due to pain and decreased sensation in LE.  pt indicates very fatigued after performing gait.     Stairs            Wheelchair Mobility    Modified Rankin (Stroke Patients Only)       Balance Overall balance assessment: Needs assistance Sitting-balance support: No upper extremity supported;Feet supported Sitting balance-Leahy Scale: Good     Standing balance support: Bilateral upper extremity supported;During functional activity Standing balance-Leahy Scale: Poor Standing balance comment: Relies heavily on UEs.                    Cognition Arousal/Alertness: Awake/alert Behavior During Therapy: WFL for tasks assessed/performed Overall Cognitive Status: Within Functional Limits for tasks assessed                      Exercises General Exercises - Lower Extremity Ankle Circles/Pumps: AROM;Right;5 reps Long Arc Quad: AROM;Right;5 reps    General Comments        Pertinent Vitals/Pain Pain Assessment: 0-10 Pain Score: 6  Pain Location: R Groin worse than R LE. Pain Descriptors / Indicators: Aching;Operative site guarding;Tightness Pain Intervention(s): Monitored during session;Premedicated before session;Repositioned    Home Living  Prior Function            PT Goals (current goals can now be found in the care plan section) Acute Rehab PT Goals Patient Stated Goal: be able to be alone during day PT Goal Formulation: With patient Time For Goal Achievement: 02/05/16 Potential to Achieve Goals: Good Progress towards PT goals: Progressing toward goals    Frequency  Min 4X/week    PT Plan Current plan remains appropriate    Co-evaluation             End of Session Equipment Utilized During  Treatment: Gait belt Activity Tolerance: Patient limited by fatigue;Patient limited by pain Patient left: in bed;with call bell/phone within reach     Time: [id number] PT Time Calculation (min) (ACUTE ONLY): 23 min  Charges:  $Gait Training: 8-22 mins $Therapeutic Activity: 8-22 mins                    G CodesCatarina Hartshorn, [place] [phone] 02/01/2016, 1:50 PM

## 2016-02-01 NOTE — Progress Notes (Signed)
   VASCULAR SURGERY ASSESSMENT & PLAN:  * Called tonight because the patient lost her Doppler signals in the right foot and was noted to have an ischemic right lower extremity. This patient has undergone previous angioplasty and stenting of the right lower extremity several times in the past. She had presented with acute onset of pain and paresthesias in the right foot and noted that the foot was cool. She was set up for an arteriogram and was found to have an occluded right superficial femoral artery with three-vessel runoff although evidence of some embolizations of the distal branches. She underwent a right femoral and profunda femoris endarterectomy with Dacron patch angioplasty and a right femoral to below-knee popliteal artery bypass with a 6 mm PTFE graft. Of note, by ultrasound the vein in the right leg appeared to be marginal in size to just below the knee. There was extensive plaque in the common femoral artery which was endarterectomized. The vein was explored but was not felt to be adequate. Completion arteriogram showed some narrowing distal to the anastomosis, but there was excellent flow distal to this area by Doppler. In addition the arteries below this were diffusely calcified. Thus there were really no good options for extending the graft further distally. This morning the patient had excellent Doppler signals in the posterior tibial and anterior tibial and peroneal arteries. However late this afternoon the patient lost those signals.  * This is clearly a limb threatening situation. I have recommended emergent thrombectomy of the graft. Given that the vein was marginal, and there is diffuse tibial disease with significant calcification I'm not sure there is too much more that can be done short of simple thrombectomy of the graft and possible tibial embolectomy. However I think this is worth attempting given the profound ischemia of the right lower extremity. She ate at 5 PM, but I will proceed  urgently.  SUBJECTIVE: complains of pain and paresthesias in the right foot.  PHYSICAL EXAM: Filed Vitals:   02/01/16 0333 02/01/16 0754 02/01/16 1141 02/01/16 1940  BP: 122/49 132/50 121/52 135/53  Pulse: 88 89 80 87  Temp: 98.1 F (36.7 C) 98.9 F (37.2 C) 99.1 F (37.3 C) 99.7 F (37.6 C)  TempSrc: Oral Oral Oral Oral  Resp: 15 22 20 20   Height:      Weight: 147 lb 11.3 oz (67 kg)     SpO2: 93% 92% 97% 95%   No Doppler flow in the right foot. There is a normal right femoral pulse. The right lower extremity is mottled from the calf down. Motor and sensory function is decreased.  LABS: Lab Results  Component Value Date   WBC 19.3* 02/01/2016   HGB 8.6* 02/01/2016   HCT 29.4* 02/01/2016   MCV 69.2* 02/01/2016   PLT 317 02/01/2016   Lab Results  Component Value Date   CREATININE 1.05* 02/01/2016   Lab Results  Component Value Date   INR 1.09 01/31/2016   Active Problems:   Claudication [facility])  Gae Gallop Beeper: [id number] 02/01/2016

## 2016-02-01 NOTE — Progress Notes (Signed)
Patient to transfer to 2W17 report given to receiving nurse, all questions answered at this time.  Pt. VSS with no s/s of distress noted.  Patient stable at transfer.

## 2016-02-02 DIAGNOSIS — I70211 Atherosclerosis of native arteries of extremities with intermittent claudication, right leg: Secondary | ICD-10-CM

## 2016-02-02 DIAGNOSIS — I10 Essential (primary) hypertension: Secondary | ICD-10-CM

## 2016-02-02 DIAGNOSIS — T82858A Stenosis of vascular prosthetic devices, implants and grafts, initial encounter: Secondary | ICD-10-CM

## 2016-02-02 LAB — CBC
HEMATOCRIT: 25.8 % — AB (ref 36.0–46.0)
HEMOGLOBIN: 7.5 g/dL — AB (ref 12.0–15.0)
MCH: 20.2 pg — ABNORMAL LOW (ref 26.0–34.0)
MCHC: 29.1 g/dL — AB (ref 30.0–36.0)
MCV: 69.5 fL — AB (ref 78.0–100.0)
Platelets: 264 10*3/uL (ref 150–400)
RBC: 3.71 MIL/uL — ABNORMAL LOW (ref 3.87–5.11)
RDW: 19.7 % — ABNORMAL HIGH (ref 11.5–15.5)
WBC: 17.7 10*3/uL — ABNORMAL HIGH (ref 4.0–10.5)

## 2016-02-02 LAB — APTT: APTT: 50 s — AB (ref 24–37)

## 2016-02-02 LAB — HEPARIN LEVEL (UNFRACTIONATED)

## 2016-02-02 MED ORDER — HYDROMORPHONE HCL 1 MG/ML IJ SOLN
0.5000 mg | INTRAMUSCULAR | Status: DC | PRN
  Administered 2016-02-05: 1 mg via INTRAVENOUS
  Filled 2016-02-02: qty 1

## 2016-02-02 MED ORDER — GUAIFENESIN-DM 100-10 MG/5ML PO SYRP: 15.0000 mL | ORAL_SOLUTION | ORAL | Status: DC | PRN

## 2016-02-02 MED ORDER — HEPARIN (PORCINE) IN NACL 100-0.45 UNIT/ML-% IJ SOLN
800.0000 [IU]/h | INTRAMUSCULAR | Status: AC
  Administered 2016-02-02 – 2016-02-03 (×2): 800 [IU]/h via INTRAVENOUS
  Filled 2016-02-02 (×2): qty 250

## 2016-02-02 MED ORDER — OXYCODONE-ACETAMINOPHEN 5-325 MG PO TABS
1.0000 | ORAL_TABLET | ORAL | Status: DC | PRN
  Administered 2016-02-02 – 2016-02-07 (×12): 2 via ORAL
  Filled 2016-02-02 (×12): qty 2

## 2016-02-02 MED ORDER — ONDANSETRON HCL 4 MG/2ML IJ SOLN: 4.0000 mg | Freq: Once | INTRAMUSCULAR | Status: DC | PRN

## 2016-02-02 MED ORDER — MAGNESIUM SULFATE 2 GM/50ML IV SOLN
2.0000 g | Freq: Every day | INTRAVENOUS | Status: DC | PRN
  Filled 2016-02-02: qty 50

## 2016-02-02 MED ORDER — FENTANYL CITRATE (PF) 100 MCG/2ML IJ SOLN: 25.0000 ug | INTRAMUSCULAR | Status: DC | PRN

## 2016-02-02 MED ORDER — SODIUM CHLORIDE 0.9 % IV SOLN: 500.0000 mL | Freq: Once | INTRAVENOUS | Status: DC | PRN

## 2016-02-02 NOTE — Progress Notes (Signed)
   VASCULAR SURGERY ASSESSMENT & PLAN:  * 1 Day Post-Op s/p: Return to OR for thrombectomy of right femoropopliteal bypass graft. In addition, she had endarterectomy of the below-knee popliteal artery and tibial peroneal trunk with bovine pericardial patch angioplasty. Graft is patent.  *  Elevate legs. Mild Swelling but no evidence of compartment syndrome.  * Mobilize slowly.  * minimal drainage from JP. Discontinue JP.  SUBJECTIVE: Right leg still with some paresthesias.  PHYSICAL EXAM: Filed Vitals:   02/02/16 0500 02/02/16 0505 02/02/16 0700 02/02/16 0736  BP: 138/49  152/53   Pulse: 87  93   Temp:    99.6 F (37.6 C)  TempSrc:      Resp: 19  18   Height:      Weight:  145 lb 1 oz (65.8 kg)    SpO2: 99%  99%    Right foot is warm and well-perfused. Good Doppler signals in the dorsalis pedis, posterior tibial, and peroneal positions. Mild calf swelling. Incisions look fine.   LABS: Lab Results  Component Value Date   WBC PENDING 02/02/2016   HGB 7.5* 02/02/2016   HCT 25.8* 02/02/2016   MCV 69.5* 02/02/2016   PLT PENDING 02/02/2016   Lab Results  Component Value Date   CREATININE 1.05* 02/01/2016   Lab Results  Component Value Date   INR 1.09 01/31/2016   CBG (last 3)  No results for input(s): GLUCAP in the last 72 hours.  Active Problems:   Claudication [facility])    Gae Gallop Beeper: [id number] 02/02/2016

## 2016-02-02 NOTE — Progress Notes (Addendum)
   SUBJECTIVE: The patient is recovering from surgery earlier today.  At this time, she denies chest pain, shortness of breath, or any new concerns.  Marland Kitchen aspirin EC  81 mg Oral Daily  . diltiazem  180 mg Oral q morning - 10a  . docusate sodium  200 mg Oral QHS  . ezetimibe  10 mg Oral QHS  . flecainide  50 mg Oral BID  . lisinopril  5 mg Oral QHS  . sodium chloride flush  3 mL Intravenous Q12H   . sodium chloride 75 mL/hr at 02/02/16 0508  . heparin 800 Units/hr (02/02/16 0508)    OBJECTIVE: Physical Exam: Filed Vitals:   02/02/16 0500 02/02/16 0505 02/02/16 0700 02/02/16 0736  BP: 138/49  152/53 141/50  Pulse: 87  93 102  Temp:    99.6 F (37.6 C)  TempSrc:    Oral  Resp: 19  18 20   Height:      Weight:  145 lb 1 oz (65.8 kg)    SpO2: 99%  99%     Intake/Output Summary (Last 24 hours) at 02/02/16 1016 Last data filed at 02/02/16 0800  Gross per 24 hour  Intake   1549 ml  Output   1585 ml  Net    -36 ml    Telemetry reveals sinus rhythm  GEN- The patient is sleeping but rouses Head- normocephalic, atraumatic Eyes-  Sclera clear, conjunctiva pink Ears- hearing intact Oropharynx- clear Neck- supple,   Lungs- Clear to ausculation bilaterally, normal work of breathing Heart- Regular rate and rhythm, 2/6 SEM LUSB early peaking GI- soft, NT, ND, + BS Skin- no rash or lesion Psych- euthymic mood, full affect Neuro- strength and sensation are intact  LABS: Basic Metabolic Panel:  Recent Labs  01/31/16 0240 02/01/16 0600  NA 135 138  K 4.3 3.9  CL 100* 102  CO2 26 24  GLUCOSE 119* 138*  BUN 22* 15  CREATININE 0.87 1.05*  CALCIUM 8.8* 8.4*   Liver Function Tests: No results for input(s): AST, ALT, ALKPHOS, BILITOT, PROT, ALBUMIN in the last 72 hours. No results for input(s): LIPASE, AMYLASE in the last 72 hours. CBC:  Recent Labs  02/01/16 0600 02/02/16 0427  WBC 19.3* 17.7*  HGB 8.6* 7.5*  HCT 29.4* 25.8*  MCV 69.2* 69.5*  PLT 317 264     ASSESSMENT AND PLAN:  1. PVD  status post Right femoral to below-knee popliteal artery bypass with repeat surgical embolectomy this am-management per vascular surgery. Continue aspirin. Intolerant to statins.  2. paroxysmal atrial fibrillation maintaining sinus rhythm.  continue Cardizem and flecainide. Resume apixaban when ok with surgery.  3. hypertension Stable No change required today   Thompson Grayer, MD 02/02/2016 10:16 AM

## 2016-02-02 NOTE — Transfer of Care (Signed)
Immediate Anesthesia Transfer of Care Note  Patient: [name]  Procedure(s) Performed: Procedure(s): Thrombectomy of Right Femoral-Popliteal Bypass Graft; Endarterectomy of Right Below Knee Popliteal Artery and Tibial Peroneal Trunk with Bovine Pericardium Patch Angioplasty. (Right)  Patient Location: PACU  Anesthesia Type:General  Level of Consciousness: awake  Airway & Oxygen Therapy: Patient Spontanous Breathing and Patient connected to face mask oxygen  Post-op Assessment: Report given to RN and Post -op Vital signs reviewed and stable  Post vital signs: Reviewed and stable  Last Vitals:  Filed Vitals:   02/01/16 1141 02/01/16 1940  BP: 121/52 135/53  Pulse: 80 87  Temp: 37.3 C 37.6 C  Resp: 20 20    Complications: No apparent anesthesia complications

## 2016-02-02 NOTE — Anesthesia Preprocedure Evaluation (Addendum)
Anesthesia Evaluation  Patient identified by MRN, date of birth, ID band Patient awake    Reviewed: Allergy & Precautions, NPO status , Patient's Chart, lab work & pertinent test results, reviewed documented beta blocker date and time   Airway Mallampati: II  TM Distance: >3 FB Neck ROM: Full    Dental  (+) Upper Dentures, Edentulous Lower   Pulmonary former smoker,    breath sounds clear to auscultation       Cardiovascular hypertension, + CAD, + Past MI and + Peripheral Vascular Disease  + dysrhythmias Atrial Fibrillation  Rhythm:Regular Rate:Normal     Neuro/Psych CVA, No Residual Symptoms    GI/Hepatic GERD  ,  Endo/Other    Renal/GU      Musculoskeletal   Abdominal   Peds  Hematology   Anesthesia Other Findings   Reproductive/Obstetrics                            Anesthesia Physical Anesthesia Plan  ASA: III and emergent  Anesthesia Plan: General   Post-op Pain Management:    Induction: Intravenous, Cricoid pressure planned and Rapid sequence  Airway Management Planned: Oral ETT  Additional Equipment:   Intra-op Plan:   Post-operative Plan: Extubation in OR  Informed Consent: I have reviewed the patients History and Physical, chart, labs and discussed the procedure including the risks, benefits and alternatives for the proposed anesthesia with the patient or authorized representative who has indicated his/her understanding and acceptance.   Dental advisory given  Plan Discussed with: CRNA, Anesthesiologist and Surgeon  Anesthesia Plan Comments:         Anesthesia Quick Evaluation

## 2016-02-02 NOTE — Op Note (Signed)
    NAME: [name]   MRN: [id number] DOB: 05/29/1951    DATE OF OPERATION: 02/02/2016  PREOP DIAGNOSIS: ischemic right leg with occluded right femoropopliteal bypass graft  POSTOP DIAGNOSIS: same  PROCEDURE:  1. Thrombectomy right femoropopliteal bypass graft 2. Endarterectomy below knee popliteal artery and tibial peroneal trunk 3. Bovine pericardial patch angioplasty of below-knee popliteal artery and tibial peroneal trunk  SURGEON: Judeth Cornfield. Scot Dock, MD, FACS  ASSIST: none  ANESTHESIA: Gen.   EBL: minimal  INDICATIONS: [name] is a 65 y.o. female who underwent revascularization yesterday. She developed a mottled right lower extremity with loss of Doppler flow in the right foot. She's taken urgently to the operating room for attempted thrombectomy of her graft.  FINDINGS: there was extensive plaque in the below-knee popliteal artery and proximal tibial vessels. This was endarterectomized. Bovine pericardial patch angioplasty was performed and the femoropopliteal bypass graft anastomosed to the patch.  TECHNIQUE: The patient was taken to the operating room and received a general anesthetic. The right lower extremity was prepped and draped in the usual sterile fashion. The incision below the knee was opened and the dissection carried down to the anastomosis of the PTFE graft to the below-knee popliteal artery. There was no pulse present here. The patient was heparinized. The anastomosis was taken down the graft separated from the artery. Graft thrombectomy was then performed using #4 Fogarty catheter. A large amount of clot was retrieved and excellent inflow established to the graft. Once no further clot was retrieved, the graft was flushed with heparinized saline and clamped. Next thrombectomy was performed at the anastomosis and then the endarterectomy extended distally through the area with a large amount of plaque. I extended the arteriotomy onto the tibial peroneal trunk.  An extensive endarterectomy was performed and I was able to retrieve significant plaque from the anterior tibial artery and tibial peroneal trunk. The endarterectomy was completed and the artery irrigated with copious amounts of saline. A bovine pericardial patch was then sewn using continuous 6-0 Prolene suture. The PTFE graft was then spatulated and sewn end to side to a arteriotomy made in the bovine pericardial patch. This was done with running 6-0 Prolene suture. Prior to completing the anastomosis the arteries were backbled and flushed and the anastomosis completed. Flow was reestablished to the right leg. At the completion was an excellent posterior tibial, peroneal, and anterior tibial signal with the Doppler. Partners were soft. The heparin was not reversed with protamine. A 15 Blake drain was placed. The deep layer was closed with running 2-0 Vicryl. The subcutaneous layer was closed with running 3-0 Vicryl. The skin was closed with a 4-0 subcuticular stitch. Dermabond was applied. The patient tolerated the procedure well and transferred to the recovery room in stable condition. All needle and sponge counts were correct.  Deitra Mayo, MD, FACS [facility]  DATE OF DICTATION:   02/02/2016

## 2016-02-02 NOTE — Anesthesia Postprocedure Evaluation (Signed)
Anesthesia Post Note  Patient: [name]  Procedure(s) Performed: Procedure(s) (LRB): Thrombectomy of Right Femoral-Popliteal Bypass Graft; Endarterectomy of Right Below Knee Popliteal Artery and Tibial Peroneal Trunk with Bovine Pericardium Patch Angioplasty. (Right)  Patient location during evaluation: PACU Anesthesia Type: General Level of consciousness: awake and awake and alert Pain management: pain level controlled Vital Signs Assessment: post-procedure vital signs reviewed and stable Respiratory status: spontaneous breathing and nonlabored ventilation Anesthetic complications: no    Last Vitals:  Filed Vitals:   02/02/16 0439 02/02/16 0500  BP: 135/55 138/49  Pulse: 94 87  Temp: 36.8 C   Resp: 17 19    Last Pain:  Filed Vitals:   02/02/16 0554  PainSc: 6                  Ervin Hensley COKER

## 2016-02-03 ENCOUNTER — Encounter (HOSPITAL_COMMUNITY): Payer: Medicare Other

## 2016-02-03 ENCOUNTER — Encounter (HOSPITAL_COMMUNITY): Payer: Self-pay | Admitting: Vascular Surgery

## 2016-02-03 DIAGNOSIS — E785 Hyperlipidemia, unspecified: Secondary | ICD-10-CM

## 2016-02-03 LAB — BASIC METABOLIC PANEL
Anion gap: 8 (ref 5–15)
BUN: 19 mg/dL (ref 6–20)
CHLORIDE: 112 mmol/L — AB (ref 101–111)
CO2: 18 mmol/L — AB (ref 22–32)
CREATININE: 1.46 mg/dL — AB (ref 0.44–1.00)
Calcium: 7.9 mg/dL — ABNORMAL LOW (ref 8.9–10.3)
GFR calc non Af Amer: 37 mL/min — ABNORMAL LOW (ref >60)
GFR, EST AFRICAN AMERICAN: 43 mL/min — AB (ref >60)
Glucose, Bld: 120 mg/dL — ABNORMAL HIGH (ref 65–99)
POTASSIUM: 3.9 mmol/L (ref 3.5–5.1)
Sodium: 138 mmol/L (ref 135–145)

## 2016-02-03 LAB — HEPARIN LEVEL (UNFRACTIONATED)

## 2016-02-03 LAB — CBC
HEMATOCRIT: 24.8 % — AB (ref 36.0–46.0)
HEMOGLOBIN: 7.1 g/dL — AB (ref 12.0–15.0)
MCH: 19.9 pg — AB (ref 26.0–34.0)
MCHC: 28.6 g/dL — AB (ref 30.0–36.0)
MCV: 69.5 fL — ABNORMAL LOW (ref 78.0–100.0)
Platelets: 309 10*3/uL (ref 150–400)
RBC: 3.57 MIL/uL — AB (ref 3.87–5.11)
RDW: 19.9 % — ABNORMAL HIGH (ref 11.5–15.5)
WBC: 20.7 10*3/uL — ABNORMAL HIGH (ref 4.0–10.5)

## 2016-02-03 LAB — APTT: APTT: 49 s — AB (ref 24–37)

## 2016-02-03 MED ORDER — VANCOMYCIN HCL 10 G IV SOLR
1250.0000 mg | Freq: Once | INTRAVENOUS | Status: AC
  Administered 2016-02-03: 1250 mg via INTRAVENOUS
  Filled 2016-02-03: qty 1250

## 2016-02-03 MED ORDER — VANCOMYCIN HCL IN DEXTROSE 750-5 MG/150ML-% IV SOLN
750.0000 mg | INTRAVENOUS | Status: DC
  Administered 2016-02-04 – 2016-02-05 (×2): 750 mg via INTRAVENOUS
  Filled 2016-02-03 (×3): qty 150

## 2016-02-03 MED ORDER — APIXABAN 5 MG PO TABS
5.0000 mg | ORAL_TABLET | Freq: Two times a day (BID) | ORAL | Status: DC
  Administered 2016-02-03 – 2016-02-07 (×8): 5 mg via ORAL
  Filled 2016-02-03 (×9): qty 1

## 2016-02-03 NOTE — Progress Notes (Signed)
Patient Profile: 65 y/o female with h/o PVD, polycythemia vera and PAF on chronic oral anticoagulation with Eliquis. She underwent stenting of her ostium/proximal and mid right SFA with Viabahn covered stents in July 2016 per Dr. [name]. She presented to our office 01/28/16 with a complaint of acute onset right lower extremity pain with numbness and discoloration in her right foot. Doppler studies revealed a right ABI of 0.24 with an occluded right SFA. Stent thrombosis was suspected and she was admitted for repeat angiography and potential surgical revascularization. She was found to have an occlusion of the previously placed covered stent of the right SFA throughout its course with 3 vessel runoff below the knee with evidence of some embolization of distal branches. She is now s/p rt below-knee popliteal artery bypass per Dr. Kellie Simmering. On post op day 1, she developed a mottled right lower extremity with loss of Doppler flow in the right foot. She was taken back rgently to the OR for repeat surgical embolectomy, per Dr. Scot Dock.   Subjective: Only complaint is incision pain and LBP from sleeping in hospital bed.   Objective: Vital signs in last 24 hours: Temp:  [98.2 F (36.8 C)-101.3 F (38.5 C)] 101.3 F (38.5 C) (02/13 0711) Pulse Rate:  [88-117] 117 (02/13 0240) Resp:  [22-30] 30 (02/13 0240) BP: (117-149)/(44-55) 149/55 mmHg (02/13 0240) SpO2:  [85 %-100 %] 85 % (02/13 0240) Weight:  [143 lb 8.3 oz (65.1 kg)] 143 lb 8.3 oz (65.1 kg) (02/13 0430) Last BM Date: 01/30/16  Intake/Output from previous day: 02/12 0701 - 02/13 0700 In: 2093 [I.V.:2093] Out: 850 [Urine:850] Intake/Output this shift:    Medications Current Facility-Administered Medications  Medication Dose Route Frequency Provider Last Rate Last Dose  . 0.9 %  sodium chloride infusion  250 mL Intravenous PRN Lorretta Harp, MD      . 0.9 %  sodium chloride infusion  500 mL Intravenous Once PRN Samantha J Rhyne, PA-C       . 0.9 %  sodium chloride infusion   Intravenous Continuous Gabriel Earing, PA-C 75 mL/hr at 02/03/16 0622    . 0.9 %  sodium chloride infusion  500 mL Intravenous Once PRN Angelia Mould, MD      . acetaminophen (TYLENOL) tablet 650 mg  650 mg Oral Q4H PRN Evelene Croon Barrett, PA-C   650 mg at 02/01/16 1028  . ALPRAZolam (XANAX) tablet 0.25 mg  0.25 mg Oral BID PRN Evelene Croon Barrett, PA-C      . alum & mag hydroxide-simeth (MAALOX/MYLANTA) 200-200-20 MG/5ML suspension 15-30 mL  15-30 mL Oral Q2H PRN Samantha J Rhyne, PA-C      . aspirin EC tablet 81 mg  81 mg Oral Daily Samantha J Rhyne, PA-C   81 mg at 02/02/16 0946  . bisacodyl (DULCOLAX) suppository 10 mg  10 mg Rectal Daily PRN Samantha J Rhyne, PA-C      . diltiazem (CARDIZEM CD) 24 hr capsule 180 mg  180 mg Oral q morning - 10a Rogelia Mire, NP   180 mg at 02/02/16 0946  . docusate sodium (COLACE) capsule 200 mg  200 mg Oral QHS Rogelia Mire, NP   200 mg at 02/02/16 2127  . ezetimibe (ZETIA) tablet 10 mg  10 mg Oral QHS Rogelia Mire, NP   10 mg at 02/02/16 2127  . flecainide (TAMBOCOR) tablet 50 mg  50 mg Oral BID Rogelia Mire, NP   50 mg at 02/02/16  2127  . guaiFENesin-dextromethorphan (ROBITUSSIN DM) 100-10 MG/5ML syrup 15 mL  15 mL Oral Q4H PRN Samantha J Rhyne, PA-C      . heparin ADULT infusion 100 units/mL (25000 units/250 mL)  800 Units/hr Intravenous Continuous Angelia Mould, MD 8 mL/hr at 02/03/16 0753 800 Units/hr at 02/03/16 0753  . hydrALAZINE (APRESOLINE) injection 10 mg  10 mg Intravenous Q8H PRN Almyra Deforest, PA      . HYDROmorphone (DILAUDID) injection 0.5-1 mg  0.5-1 mg Intravenous Q2H PRN Angelia Mould, MD      . magnesium sulfate IVPB 2 g 50 mL  2 g Intravenous Daily PRN Angelia Mould, MD      . morphine 2 MG/ML injection 2-3 mg  2-3 mg Intravenous Q2H PRN Gabriel Earing, PA-C   2 mg at 02/02/16 1829  . nitroGLYCERIN (NITROSTAT) SL tablet 0.4 mg  0.4 mg Sublingual Q5  Min x 3 PRN Rhonda G Barrett, PA-C      . ondansetron (ZOFRAN) injection 4 mg  4 mg Intravenous Q6H PRN Evelene Croon Barrett, PA-C   4 mg at 02/02/16 0811  . oxyCODONE-acetaminophen (PERCOCET/ROXICET) 5-325 MG per tablet 1-2 tablet  1-2 tablet Oral Q4H PRN Angelia Mould, MD   2 tablet at 02/03/16 0753  . phenol (CHLORASEPTIC) mouth spray 1 spray  1 spray Mouth/Throat PRN Samantha J Rhyne, PA-C      . polyvinyl alcohol (LIQUIFILM TEARS) 1.4 % ophthalmic solution 1 drop  1 drop Both Eyes PRN Rogelia Mire, NP      . potassium chloride SA (K-DUR,KLOR-CON) CR tablet 20-40 mEq  20-40 mEq Oral Daily PRN Samantha J Rhyne, PA-C      . sodium chloride flush (NS) 0.9 % injection 3 mL  3 mL Intravenous Q12H Rhonda G Barrett, PA-C   3 mL at 02/02/16 2131  . traMADol (ULTRAM) tablet 50 mg  50 mg Oral TID PRN Rogelia Mire, NP   50 mg at 02/01/16 0723  . zolpidem (AMBIEN) tablet 5 mg  5 mg Oral QHS PRN Lonn Georgia, PA-C        PE: General appearance: alert, cooperative and no distress Neck: no carotid bruit and no JVD Lungs: clear to auscultation bilaterally Heart: regular rate and rhythm, S1, S2 normal, no murmur, click, rub or gallop Extremities: no LEE Pulses: 2+ and symmetric Skin: warm and dry Neurologic: Grossly normal  Lab Results:   Recent Labs  02/01/16 0600 02/02/16 0427 02/03/16 0505  WBC 19.3* 17.7* 20.7*  HGB 8.6* 7.5* 7.1*  HCT 29.4* 25.8* 24.8*  PLT 317 264 309   BMET  Recent Labs  02/01/16 0600 02/03/16 0505  NA 138 138  K 3.9 3.9  CL 102 112*  CO2 24 18*  GLUCOSE 138* 120*  BUN 15 19  CREATININE 1.05* 1.46*  CALCIUM 8.4* 7.9*    Assessment/Plan  Active Problems:   Claudication (HCC)   1. PVD: status post Right femoral to below-knee popliteal artery bypass with repeat surgical embolectomy. White count trending up and patient is slightly febrile. Vascular surgery managing post surgical care. May need antibiotics.  2. PAF: maintaining  sinus rhythm.Cntinue Cardizem and flecainide. Resume apixaban when ok with surgery.  3. HTN: stable. Continue Cardizem. PRN IV hydralazine ordered if needed.      LOS: 6 days    Jamarco Zaldivar M. Ladoris Gene 02/03/2016 8:18 AM

## 2016-02-03 NOTE — Care Management Important Message (Signed)
Important Message  Patient Details  Name: [name] MRN: [id number] Date of Birth: [birth year]   Medicare Important Message Given:  Yes    Callee Rohrig P Lilya Smitherman 02/03/2016, 2:11 PM

## 2016-02-03 NOTE — Evaluation (Signed)
Occupational Therapy Evaluation Patient Details Name: [name] MRN: [id number] DOB: 1951-11-23 Today's Date: 02/03/2016    History of Present Illness Adm 01/28/16 with RLE claudication/pain. 2/8 angiogram with multiple blockages; 2/10 Rt fem-pop BPG  PMHx- PVD (previous LE stents), afib on anticoagulation, CVA, MI, Lt knee surgery,    Clinical Impression   Patient is s/p R fem-pop BPG surgery resulting in functional limitations due to the deficits listed below (see OT problem list). PTA independent with all adls and iadls. Patient will benefit from skilled OT acutely to increase independence and safety with ADLS to allow discharge HHOT 3n1 and RW. Pt reports a neighbor can check on her and allow her to borrow a RW if needed     Follow Up Recommendations  Home health OT    Equipment Recommendations  3 in 1 bedside comode;Other (comment) (RW)    Recommendations for Other Services       Precautions / Restrictions Precautions Precautions: Fall      Mobility Bed Mobility Overal bed mobility: Modified Independent Bed Mobility: Rolling Rolling: Modified independent (Device/Increase time)         General bed mobility comments: rail removed and pt able to log roll with incr time  Transfers                      Balance                                            ADL Overall ADL's : Needs assistance/impaired Eating/Feeding: Independent   Grooming: Wash/dry face;Independent;Bed level               Lower Body Dressing: Moderate assistance;Bed level Lower Body Dressing Details (indicate cue type and reason): able to cross L LE and educated on dressing R then left. Pt has reacher at home and educated on using reacher to dress R LE               General ADL Comments: pt with 8 out 10 pain at this time s/p 3n1 transfer with staff. Pt agreeable to bed level evaluation. Pt educated on LB dressing/ started discussion of energy conservation,  avoiding washing incision site, and equipment needs     Vision     Perception     Praxis      Pertinent Vitals/Pain Pain Assessment: Faces Pain Score: 8  Pain Location: back and R le Pain Descriptors / Indicators: Constant Pain Intervention(s): Monitored during session;Repositioned (RN bring medication)  Heat applied to back due to back pain   Hand Dominance Right   Extremity/Trunk Assessment Upper Extremity Assessment Upper Extremity Assessment: Overall WFL for tasks assessed   Lower Extremity Assessment Lower Extremity Assessment: Defer to PT evaluation   Cervical / Trunk Assessment Cervical / Trunk Assessment: Normal   Communication Communication Communication: No difficulties   Cognition Arousal/Alertness: Awake/alert Behavior During Therapy: WFL for tasks assessed/performed Overall Cognitive Status: Within Functional Limits for tasks assessed                     General Comments       Exercises       Shoulder Instructions      Home Living Family/patient expects to be discharged to:: Private residence Living Arrangements: Other relatives Available Help at Discharge: Friend(s);Available PRN/intermittently;Family Type of Home: House Home Access: Stairs to  enter Entrance Stairs-Number of Steps: 4 Entrance Stairs-Rails: Right;Left Home Layout: One level     Bathroom Shower/Tub: Teacher, early years/pre: Standard     Home Equipment: Environmental consultant - 2 wheels;Adaptive equipment Adaptive Equipment: Reacher        Prior Functioning/Environment Level of Independence: Independent        Comments: has 2 grandchildren that can help her after they get out of school    OT Diagnosis: Generalized weakness;Acute pain   OT Problem List: Decreased strength;Decreased activity tolerance;Impaired balance (sitting and/or standing);Decreased safety awareness;Decreased knowledge of use of DME or AE;Decreased knowledge of precautions;Pain   OT  Treatment/Interventions: Self-care/ADL training;Therapeutic exercise;DME and/or AE instruction;Energy conservation;Therapeutic activities;Patient/family education;Balance training    OT Goals(Current goals can be found in the care plan section) Acute Rehab OT Goals Patient Stated Goal: be able to be alone during day OT Goal Formulation: With patient Time For Goal Achievement: 02/17/16 Potential to Achieve Goals: Good  OT Frequency: Min 2X/week   Barriers to D/C: Decreased caregiver support  children school age to assist ( middle school age)       Co-evaluation              End of Session Equipment Utilized During Treatment: Oxygen Nurse Communication: Mobility status;Precautions  Activity Tolerance: Patient tolerated treatment well Patient left: in bed;with call bell/phone within reach;with bed alarm set   Time: 1404-1420 OT Time Calculation (min): 16 min Charges:  OT General Charges $OT Visit: 1 Procedure OT Evaluation $OT Eval Moderate Complexity: 1 Procedure G-Codes:    Parke Poisson B 2016-02-09, 3:26 PM  Jeri Modena   OTR/L [phone] Office: [phone] .

## 2016-02-03 NOTE — Progress Notes (Addendum)
Progress Note    02/03/2016 7:39 AM 2 Days Post-Op  Subjective:  C/o pain in her leg and lower back.  Tm 101.3 HR 90's-110's NSR/ST 99991111 systolic 0000000 0000000  Filed Vitals:   02/03/16 0241 02/03/16 0711  BP:    Pulse:    Temp: 99.2 F (37.3 C) 101.3 F (38.5 C)  Resp:      Physical Exam: Cardiac:  tachy Lungs:  Non labored Incisions:  All incisions are clean and dry Extremities:  Right foot is warm with palpable right DP/PT   CBC    Component Value Date/Time   WBC 20.7* 02/03/2016 0505   RBC 3.57* 02/03/2016 0505   HGB 7.1* 02/03/2016 0505   HCT 24.8* 02/03/2016 0505   PLT 309 02/03/2016 0505   MCV 69.5* 02/03/2016 0505   MCH 19.9* 02/03/2016 0505   MCHC 28.6* 02/03/2016 0505   RDW 19.9* 02/03/2016 0505   LYMPHSABS 1.2 01/28/2016 1724   MONOABS 0.5 01/28/2016 1724   EOSABS 0.5 01/28/2016 1724   BASOSABS 0.2* 01/28/2016 1724    BMET    Component Value Date/Time   NA 138 02/03/2016 0505   K 3.9 02/03/2016 0505   CL 112* 02/03/2016 0505   CO2 18* 02/03/2016 0505   GLUCOSE 120* 02/03/2016 0505   BUN 19 02/03/2016 0505   CREATININE 1.46* 02/03/2016 0505   CREATININE 1.02* 07/26/2015 1022   CALCIUM 7.9* 02/03/2016 0505   GFRNONAA 37* 02/03/2016 0505   GFRAA 43* 02/03/2016 0505    INR    Component Value Date/Time   INR 1.09 01/31/2016 0240     Intake/Output Summary (Last 24 hours) at 02/03/16 0739 Last data filed at 02/03/16 0600  Gross per 24 hour  Intake   2093 ml  Output    850 ml  Net   1243 ml     Assessment:  65 y.o. female is s/p:  Right CFA to below knee Popliteal artery bypass graft with propaten and intraoperative arteriogram with right CFA and profunda endarterectomy with patch angioplasty and exam of the saphenous but not removed. 3 Days Post-Op and 1. Thrombectomy right femoropopliteal bypass graft 2. Endarterectomy below knee popliteal artery and tibial peroneal trunk 3. Bovine pericardial patch angioplasty of  below-knee popliteal artery and tibial peroneal trunk  2 Days Post-Op  Plan: -pt with palpable right DP/PT and right foot is warm.  Right 3rd toe is improving. -pt with fever of 101.3 this morning with WBC of 20.7, which is up slightly from yesterday-most likely atelectasis as pt is not mobilizing, but this needs to be watched closely as she does have synthetic graft for bypass.  Will get incentive spirometer to pt's room.  Ambulate and mobilize more.  -given the WBC and the fever-will start Vanc empirically as she does have synthetic graft material  -creatinine is also elevated this morning at 1.46 from 1.05 on Saturday.  Will discontinue Lisinopril for now -DVT prophylaxis:  Pt on Heparin 800U/hr at this time-will start back Eliquis today. Pharmacy consult. -acute blood loss anemia-may need to transfuse-will d/w Dr. Kellie Simmering  -Heart rate control per cardiology -check labs tomorrow morning   Leontine Locket, [place] [facility] [phone] 02/03/2016 7:39 AM agree with above assessment Incisions healing nicely Palpable DP and PT in right foot was well-perfused right foot-bypass working well at present time  Continue Eliquis and DC heparin per pharmacy We will transfer to 2 W. in a.m. and began to mobilize out of bed today  Open to  DC home in a few days Will recheck CBC in a.m. and hold off on transfusion for now

## 2016-02-03 NOTE — Progress Notes (Signed)
Pharmacy Consult Note  [name] is a 65 y.o. female admitted on 01/28/2016 for a popliteal artery bypass graft, R femoral artery and deep femoral artery endarterectomy. Pt remained on heparin throughout the weekend and is now ready to transition to Eliquis. Pt is on Eliquis PTA for AFib. Pharmacy is also consulted to start vancomycin. Pt is having fevers and increased leukocytosis in the setting of a synthetic bypass graft. SCr up from baseline, SCr 0.9 > 1 > 1.46.   Plan: -Stop heparin, start Eliquis at the same time Eliquis is started -Eliquis 5 mg po bid -Vancomycin 1250 mg IV x1 then 750mg /24h -Monitor renal fx, VT as needed   Height: 5' 5.5" (166.4 cm) Weight: 143 lb 8.3 oz (65.1 kg) IBW/kg (Calculated) : 58.15  Temp (24hrs), Avg:99.5 F (37.5 C), Min:98.2 F (36.8 C), Max:101.3 F (38.5 C)   Recent Labs Lab 01/28/16 1724 01/29/16 0035 01/30/16 0204 01/31/16 0240 02/01/16 0600 02/02/16 0427 02/03/16 0505  WBC 16.9*  --  14.8* 16.5* 19.3* 17.7* 20.7*  CREATININE 0.94 0.96  --  0.87 1.05*  --  1.46*    Estimated Creatinine Clearance: 35.8 mL/min (by C-G formula based on Cr of 1.46).    Allergies  Allergen Reactions  . Prednisone Other (See Comments)    Muscle spasms  . Statins Itching and Other (See Comments)    Simvastatin-caused severe itching Pravastatin-caused lesser tiching One type of statin? Caused stroke in 2006, and other symptoms as result  . Warfarin And Related Other (See Comments)    Caused nose bleeds  . Lactose Intolerance (Gi) Other (See Comments)    Bloating, gas  . Penicillins Hives and Other (See Comments)    Questionable high fever when taken and broke out in whelps.   . Simvastatin Itching and Rash    Also lipitor intolerant  . Atenolol Rash  . Crestor [Rosuvastatin] Itching and Rash  . Demerol [Meperidine] Other (See Comments)    Unknown, can't remember   . Effexor [Venlafaxine Hcl] Other (See Comments)    Doesn't remember   .  Inderal [Propranolol] Other (See Comments)    Doesn't remember   . Lipitor [Atorvastatin] Rash  . Lovaza [Omega-3-Acid Ethyl Esters] Other (See Comments)    Doesn't remember   . Nexium [Esomeprazole Magnesium] Rash  . Percocet [Oxycodone-Acetaminophen] Other (See Comments)    Doesn't remember   . Plavix [Clopidogrel Bisulfate] Rash  . Pravastatin Itching  . Trilipix [Choline Fenofibrate] Other (See Comments)    Doesn't remember     Antimicrobials this admission: Vancomycin 2/13 >>   Thank you for allowing pharmacy to be a part of this patient's care.  Harvel Quale 02/03/2016 9:22 AM

## 2016-02-03 NOTE — Progress Notes (Signed)
Physical Therapy Treatment Patient Details Name: [name] MRN: [id number] DOB: 30-Nov-1951 Today's Date: 02/03/2016    History of Present Illness Adm 01/28/16 with RLE claudication/pain. 2/8 angiogram with multiple blockages; 2/10 Rt fem-pop BPG  PMHx- PVD (previous LE stents), afib on anticoagulation, CVA, MI, Lt knee surgery,     PT Comments    Updated follow up recommendations to reflect pt's current mobility and need for further rehab before d/c home.  Pt currently requires min assist for sit>stand and min guard assist w/ ~10 standing rest breaks and max encouragement to ambulate 40 ft using RW.  She will be alone during the day as her two daughters go to school during the day.  Concern remains for pt's 50 and 33 y/o daughters who pt reports are at home alone w/ the neighbor checking on them.  Consult to SW remains appropriate.   Follow Up Recommendations  Supervision for mobility/OOB;CIR     Equipment Recommendations  Rolling walker with 5" wheels;3in1 (PT)    Recommendations for Other Services       Precautions / Restrictions Precautions Precautions: Fall Restrictions Weight Bearing Restrictions: No    Mobility  Bed Mobility Overal bed mobility: Needs Assistance Bed Mobility: Supine to Sit Rolling: Modified independent (Device/Increase time)   Supine to sit: Min guard     General bed mobility comments: Increased time and cues for technique.  HOB flat and no use of bed rails.  Transfers Overall transfer level: Needs assistance Equipment used: Rolling walker (2 wheeled) Transfers: Sit to/from Stand Sit to Stand: Min assist         General transfer comment: Cues for hand placement as pt starts by placing Bil hands on RW to stand.  Pt requires min assist to hold RW steady and is very slow to stand.    Ambulation/Gait Ambulation/Gait assistance: Min guard Ambulation Distance (Feet): 40 Feet Assistive device: Rolling walker (2 wheeled) Gait  Pattern/deviations: Step-to pattern;Decreased stride length;Decreased weight shift to right;Trunk flexed;Antalgic   Gait velocity interpretation: <1.8 ft/sec, indicative of risk for recurrent falls General Gait Details: Pt initially TDWB w/ Rt LE but is able to place foot flat for last 20 ft w/ cues to do so.  Pt requires ~10 standing rest breaks saying, "I just need to stand here a moment".     Stairs            Wheelchair Mobility    Modified Rankin (Stroke Patients Only)       Balance Overall balance assessment: Needs assistance Sitting-balance support: No upper extremity supported;Feet supported Sitting balance-Leahy Scale: Good     Standing balance support: Bilateral upper extremity supported;During functional activity Standing balance-Leahy Scale: Poor Standing balance comment: Relies heavily on RW for support                    Cognition Arousal/Alertness: Awake/alert Behavior During Therapy: WFL for tasks assessed/performed Overall Cognitive Status: Within Functional Limits for tasks assessed                      Exercises General Exercises - Lower Extremity Ankle Circles/Pumps: AROM;Right;20 reps;Seated;Supine Long Arc Quad: AROM;Right;10 reps;Seated    General Comments General comments (skin integrity, edema, etc.): SpO2 down to 87% on RA while ambulating.  Back up to mid 90's when placed back on 2 LPM.      Pertinent Vitals/Pain Pain Assessment: 0-10 Pain Score: 7  Pain Location: back and Rt LE Pain Descriptors / Indicators:  Aching;Tightness;Sharp Pain Intervention(s): Limited activity within patient's tolerance;Monitored during session;Repositioned;Premedicated before session    Home Living Family/patient expects to be discharged to:: Private residence Living Arrangements: Other relatives Available Help at Discharge: Friend(s);Available PRN/intermittently;Family Type of Home: House Home Access: Stairs to enter Entrance  Stairs-Rails: Right;Left Home Layout: One level Home Equipment: Environmental consultant - 2 wheels;Adaptive equipment      Prior Function Level of Independence: Independent      Comments: has 2 grandchildren that can help her after they get out of school   PT Goals (current goals can now be found in the care plan section) Acute Rehab PT Goals Patient Stated Goal: to make a garden in  her yard PT Goal Formulation: With patient Time For Goal Achievement: 02/05/16 Potential to Achieve Goals: Good Progress towards PT goals: Progressing toward goals (very modestly)    Frequency  Min 4X/week    PT Plan Discharge plan needs to be updated    Co-evaluation             End of Session Equipment Utilized During Treatment: Gait belt;Oxygen Activity Tolerance: Patient limited by fatigue;Patient limited by pain Patient left: with call bell/phone within reach;in chair;with chair alarm set     Time: [id number] PT Time Calculation (min) (ACUTE ONLY): 35 min  Charges:  $Gait Training: 23-37 mins                    G Codes:      Joslyn Hy PT, [place] [id number] Pager: [phone] 02/03/2016, 4:55 PM

## 2016-02-04 ENCOUNTER — Inpatient Hospital Stay (HOSPITAL_COMMUNITY): Payer: Medicare Other

## 2016-02-04 DIAGNOSIS — D62 Acute posthemorrhagic anemia: Secondary | ICD-10-CM

## 2016-02-04 DIAGNOSIS — I739 Peripheral vascular disease, unspecified: Secondary | ICD-10-CM

## 2016-02-04 LAB — BASIC METABOLIC PANEL
ANION GAP: 11 (ref 5–15)
BUN: 21 mg/dL — ABNORMAL HIGH (ref 6–20)
CALCIUM: 7.9 mg/dL — AB (ref 8.9–10.3)
CO2: 19 mmol/L — AB (ref 22–32)
Chloride: 108 mmol/L (ref 101–111)
Creatinine, Ser: 1.26 mg/dL — ABNORMAL HIGH (ref 0.44–1.00)
GFR calc Af Amer: 51 mL/min — ABNORMAL LOW (ref >60)
GFR calc non Af Amer: 44 mL/min — ABNORMAL LOW (ref >60)
GLUCOSE: 110 mg/dL — AB (ref 65–99)
Potassium: 4 mmol/L (ref 3.5–5.1)
SODIUM: 138 mmol/L (ref 135–145)

## 2016-02-04 LAB — URINALYSIS W MICROSCOPIC (NOT AT ARMC)
Glucose, UA: NEGATIVE mg/dL
Hgb urine dipstick: NEGATIVE
Ketones, ur: NEGATIVE mg/dL
Leukocytes, UA: NEGATIVE
Nitrite: NEGATIVE
PH: 5.5 (ref 5.0–8.0)
Protein, ur: NEGATIVE mg/dL
SPECIFIC GRAVITY, URINE: 1.016 (ref 1.005–1.030)

## 2016-02-04 LAB — PREPARE RBC (CROSSMATCH)

## 2016-02-04 LAB — CBC
HCT: 23.5 % — ABNORMAL LOW (ref 36.0–46.0)
Hemoglobin: 6.6 g/dL — CL (ref 12.0–15.0)
MCH: 19.4 pg — ABNORMAL LOW (ref 26.0–34.0)
MCHC: 28.1 g/dL — ABNORMAL LOW (ref 30.0–36.0)
MCV: 69.1 fL — AB (ref 78.0–100.0)
PLATELETS: 276 10*3/uL (ref 150–400)
RBC: 3.4 MIL/uL — AB (ref 3.87–5.11)
RDW: 20 % — AB (ref 11.5–15.5)
WBC: 16.7 10*3/uL — AB (ref 4.0–10.5)

## 2016-02-04 MED ORDER — DIPHENHYDRAMINE HCL 25 MG PO CAPS
50.0000 mg | ORAL_CAPSULE | Freq: Once | ORAL | Status: AC
  Administered 2016-02-04: 50 mg via ORAL
  Filled 2016-02-04: qty 2

## 2016-02-04 MED ORDER — SODIUM CHLORIDE 0.9 % IV SOLN
Freq: Once | INTRAVENOUS | Status: AC
  Administered 2016-02-04: 15:00:00 via INTRAVENOUS

## 2016-02-04 NOTE — Progress Notes (Signed)
Dr. Scot Dock cancelled transfer off the unit at this time due to reaction to blood transfusion I will contniue to monitor patient.

## 2016-02-04 NOTE — Discharge Instructions (Addendum)

## 2016-02-04 NOTE — Progress Notes (Signed)
PT Cancellation Note  Patient Details Name: [name] MRN: [id number] DOB: [birth year]   Cancelled Treatment:    Reason Eval/Treat Not Completed: Medical issues which prohibited therapy (Hgb 6.6 and pt symptomatic).  Will hold PT for now but will continue to follow acutely.  Joslyn Hy PT, DPT [phone] Pager: [phone] 02/04/2016, 1:03 PM

## 2016-02-04 NOTE — Progress Notes (Signed)
CRITICAL VALUE ALERT  Critical value received:  Hgb 6.6  Date of notification:  02/04/16  Time of notification:  [id number]  Critical value read back:Yes.    Nurse who received alert:  Stacie Acres, RN  MD notified (1st page):  On-Call for Dr. Joyce Copa (PA)  Time of first page: [phone] (paged returned @[phone]  and told to contact vascular)  MD notified (2nd page): Vascular PA at bedside and notified   Responding MD:  Vascular PA

## 2016-02-04 NOTE — Progress Notes (Signed)
Patient Profile: 65 y/o female with h/o PVD, polycythemia vera and PAF on chronic oral anticoagulation with Eliquis. She underwent stenting of her ostium/proximal and mid right SFA with Viabahn covered stents in July 2016 per Dr. [name]. She presented to our office 01/28/16 with a complaint of acute onset right lower extremity pain with numbness and discoloration in her right foot. Doppler studies revealed a right ABI of 0.24 with an occluded right SFA. Stent thrombosis was suspected and she was admitted for repeat angiography and potential surgical revascularization. She was found to have an occlusion of the previously placed covered stent of the right SFA throughout its course with 3 vessel runoff below the knee with evidence of some embolization of distal branches. She is now s/p rt below-knee popliteal artery bypass per Dr. Kellie Simmering. On post op day 1, she developed a mottled right lower extremity with loss of Doppler flow in the right foot. She was taken back rgently to the OR for repeat surgical embolectomy, per Dr. Scot Dock.  Subjective: No major complaints. Denies any fever of chills.  Objective: Vital signs in last 24 hours: Temp:  [98 F (36.7 C)-101.3 F (38.5 C)] 100 F (37.8 C) (02/14 0259) Pulse Rate:  [45-97] 91 (02/13 2250) Resp:  [16-23] 21 (02/13 2250) BP: (102-126)/(45-52) 121/49 mmHg (02/13 2250) SpO2:  [90 %-100 %] 90 % (02/13 2250) Weight:  [143 lb 11.8 oz (65.2 kg)] 143 lb 11.8 oz (65.2 kg) (02/14 0500) Last BM Date: 01/29/16  Intake/Output from previous day: 02/13 0701 - 02/14 0700 In: 570 [P.O.:120; I.V.:450] Out: 150 [Urine:150] Intake/Output this shift: Total I/O In: 495 [P.O.:120; I.V.:375] Out: 150 [Urine:150]  Medications Current Facility-Administered Medications  Medication Dose Route Frequency Provider Last Rate Last Dose  . 0.9 %  sodium chloride infusion  250 mL Intravenous PRN Lorretta Harp, MD      . 0.9 %  sodium chloride infusion  500 mL  Intravenous Once PRN Samantha J Rhyne, PA-C      . 0.9 %  sodium chloride infusion   Intravenous Continuous Hulen Shouts Rhyne, PA-C 75 mL/hr at 02/03/16 2300    . 0.9 %  sodium chloride infusion  500 mL Intravenous Once PRN Angelia Mould, MD      . acetaminophen (TYLENOL) tablet 650 mg  650 mg Oral Q4H PRN Evelene Croon Barrett, PA-C   650 mg at 02/01/16 1028  . ALPRAZolam (XANAX) tablet 0.25 mg  0.25 mg Oral BID PRN Evelene Croon Barrett, PA-C      . alum & mag hydroxide-simeth (MAALOX/MYLANTA) 200-200-20 MG/5ML suspension 15-30 mL  15-30 mL Oral Q2H PRN Samantha J Rhyne, PA-C      . apixaban (ELIQUIS) tablet 5 mg  5 mg Oral BID Jake Church Masters, RPH   5 mg at 02/03/16 2034  . aspirin EC tablet 81 mg  81 mg Oral Daily Samantha J Rhyne, PA-C   81 mg at 02/03/16 1031  . bisacodyl (DULCOLAX) suppository 10 mg  10 mg Rectal Daily PRN Samantha J Rhyne, PA-C      . diltiazem (CARDIZEM CD) 24 hr capsule 180 mg  180 mg Oral q morning - 10a Rogelia Mire, NP   180 mg at 02/03/16 1031  . docusate sodium (COLACE) capsule 200 mg  200 mg Oral QHS Rogelia Mire, NP   200 mg at 02/03/16 2033  . ezetimibe (ZETIA) tablet 10 mg  10 mg Oral QHS Rogelia Mire, NP   10 mg  at 02/03/16 2035  . flecainide (TAMBOCOR) tablet 50 mg  50 mg Oral BID Rogelia Mire, NP   50 mg at 02/03/16 2034  . guaiFENesin-dextromethorphan (ROBITUSSIN DM) 100-10 MG/5ML syrup 15 mL  15 mL Oral Q4H PRN Samantha J Rhyne, PA-C      . hydrALAZINE (APRESOLINE) injection 10 mg  10 mg Intravenous Q8H PRN Almyra Deforest, PA      . HYDROmorphone (DILAUDID) injection 0.5-1 mg  0.5-1 mg Intravenous Q2H PRN Angelia Mould, MD      . magnesium sulfate IVPB 2 g 50 mL  2 g Intravenous Daily PRN Angelia Mould, MD      . morphine 2 MG/ML injection 2-3 mg  2-3 mg Intravenous Q2H PRN Gabriel Earing, PA-C   2 mg at 02/02/16 1829  . nitroGLYCERIN (NITROSTAT) SL tablet 0.4 mg  0.4 mg Sublingual Q5 Min x 3 PRN Rhonda G Barrett, PA-C       . ondansetron (ZOFRAN) injection 4 mg  4 mg Intravenous Q6H PRN Evelene Croon Barrett, PA-C   4 mg at 02/02/16 0811  . oxyCODONE-acetaminophen (PERCOCET/ROXICET) 5-325 MG per tablet 1-2 tablet  1-2 tablet Oral Q4H PRN Angelia Mould, MD   2 tablet at 02/04/16 0105  . phenol (CHLORASEPTIC) mouth spray 1 spray  1 spray Mouth/Throat PRN Samantha J Rhyne, PA-C      . polyvinyl alcohol (LIQUIFILM TEARS) 1.4 % ophthalmic solution 1 drop  1 drop Both Eyes PRN Rogelia Mire, NP      . potassium chloride SA (K-DUR,KLOR-CON) CR tablet 20-40 mEq  20-40 mEq Oral Daily PRN Samantha J Rhyne, PA-C      . sodium chloride flush (NS) 0.9 % injection 3 mL  3 mL Intravenous Q12H Rhonda G Barrett, PA-C   3 mL at 02/03/16 2035  . traMADol (ULTRAM) tablet 50 mg  50 mg Oral TID PRN Rogelia Mire, NP   50 mg at 02/01/16 0723  . vancomycin (VANCOCIN) IVPB 750 mg/150 ml premix  750 mg Intravenous Q24H Jake Church Masters, RPH      . zolpidem (AMBIEN) tablet 5 mg  5 mg Oral QHS PRN Lonn Georgia, PA-C        PE: General appearance: alert, cooperative and no distress Neck: no carotid bruit and no JVD Lungs: clear to auscultation bilaterally Heart: regular rhythm, tachy rate Extremities: 2+ LEE on the right Pulses: 2+ and symmetric Skin: warm and dry Neurologic: Grossly normal  Lab Results:   Recent Labs  02/02/16 0427 02/03/16 0505 02/04/16 0552  WBC 17.7* 20.7* PENDING  HGB 7.5* 7.1* 6.6*  HCT 25.8* 24.8* 23.5*  PLT 264 309 PENDING   BMET  Recent Labs  02/03/16 0505  NA 138  K 3.9  CL 112*  CO2 18*  GLUCOSE 120*  BUN 19  CREATININE 1.46*  CALCIUM 7.9*    Assessment/Plan  Active Problems:   Claudication ([place])  1. PVD: status post Right femoral to below-knee popliteal artery bypass with repeat surgical embolectomy.  Vascular surgery managing post surgical care.   2. PAF: maintaining sinus rhythm. Slightly tachy rate in the low 100s, likely secondary to anemia.Continue  Cardizem and flecainide for rate/rhythm control. Apixaban restarted yesterday, however anemia continues to worsen. Will discuss with MD if this needs to be held.   3. HTN: stable. Continue Cardizem. PRN IV hydralazine ordered if needed.   4. Anemia: H/H continues to drift downward. Hgb now at 6.6. RN to notify vascular  surgery. Recommend transfusion as Hgb is <7.0. Consider giving lasix post transfusion to prevent volume overload. Patient has 2+ pitting edema on the right (surgical leg).   5. HLD: intolerant of statins. We can consider referring her to our lipid clinic for PCSK-9 therapy as an outpatient.    LOS: 7 days    Erlinda Solinger M. Rosita Fire, PA-C 02/04/2016 7:01 AM

## 2016-02-04 NOTE — Progress Notes (Signed)
Patient received one unit of blood then second unit was started but at the 15 minute check she was having a reaction. Pt's temperature spiked from 98.9 to 101.3, blood was stopped. Blood bank and MD were notified and protocol followed. Pt stated that her lips were burning and face was hot. Pt's face became red and splotchy. Benadryl given per MD order and IV saline was given.

## 2016-02-04 NOTE — Progress Notes (Addendum)
[facility] Progress Note  Subjective  - POD #4/3  Complains of lower back pain from laying in bed and some pain in right leg where incisions are. States that there is a slight feeling of numbness in her right leg however still has sensation, this is unchanged from her last surgery with Dr. Scot Dock.  Objective Filed Vitals:   02/04/16 0300 02/04/16 0732  BP: 117/50   Pulse: 92   Temp:  99.3 F (37.4 C)  Resp: 18     Intake/Output Summary (Last 24 hours) at 02/04/16 [id number] Last data filed at 02/04/16 0700  Gross per 24 hour  Intake   1095 ml  Output    450 ml  Net    645 ml    Palpable PT/DP pulses in right foot 2+/1+ Right foot is warm and well perfused Right fourth toe is pink, no longer dusky in appearance Incisions are clean, dry and intact. Gauze over the JP site has sanguinous discharge however no current active bleeding Mild swelling of right leg    Assessment/Planning: 65 y.o. female is s/p: Right CFA to below knee Popliteal artery bypass graft with propaten and intraoperative arteriogram with right CFA and profunda endarterectomy with patch angioplasty and exam of the saphenous but not removed. 4 Days Post-Op and 1. Thrombectomy right femoropopliteal bypass graft 2. Endarterectomy below knee popliteal artery and tibial peroneal trunk 3. Bovine pericardial patch angioplasty of below-knee popliteal artery and tibial peroneal trunk  3 Days Post-Op    Hemoglobin at 6.6 from 7.1 yesterday; discuss blood transfusion with Dr. Kellie Simmering Temps ranging from 99.3-100.0 overnight and WBC count at 16.7 from 20.7 yesterday; pt denies any cough, SOB, CP, N/V and weakness; continue vancomycin Creatinine at 1.26 from 1.46 yesterday will continue to monitor Continue Eliquis for DVT prophylaxis  Encourage incentive spirometry and mobilization  Continue to check CBC and BMP in am  Fredric Mare PA-S 02/04/2016 8:08 AM --  Addendum Agree with above  assessment.  Bypass patent. Palpable right PT and DP pulses.  PAF: Patient currently in slight sinus tachycardia low 100s.  No signs or symptoms of bleeding. Continue Eliquis.  ABLA Hgb 6.6 today. Tolerating. Having some weakness. Will transfuse 2 units pRBCs.  Leukocytosis trending down. Continue empiric vancomycin for now.  Mobilize. Recheck labs in am.  Will transfer to [place], PA-C  Laboratory CBC    Component Value Date/Time   WBC 16.7* 02/04/2016 0552   HGB 6.6* 02/04/2016 0552   HCT 23.5* 02/04/2016 0552   PLT 276 02/04/2016 0552    BMET    Component Value Date/Time   NA 138 02/04/2016 0552   K 4.0 02/04/2016 0552   CL 108 02/04/2016 0552   CO2 19* 02/04/2016 0552   GLUCOSE 110* 02/04/2016 0552   BUN 21* 02/04/2016 0552   CREATININE 1.26* 02/04/2016 0552   CREATININE 1.02* 07/26/2015 1022   CALCIUM 7.9* 02/04/2016 0552   GFRNONAA 44* 02/04/2016 0552   GFRAA 51* 02/04/2016 0552    COAG Lab Results  Component Value Date   INR 1.09 01/31/2016   INR 1.25 01/28/2016   INR 1.15 06/25/2015   No results found for: PTT  Antibiotics Anti-infectives    Start     Dose/Rate Route Frequency Ordered Stop   02/04/16 1000  vancomycin (VANCOCIN) IVPB 750 mg/150 ml premix     750 mg 150 mL/hr over 60 Minutes Intravenous Every 24 hours 02/03/16 0943     02/03/16  1000  vancomycin (VANCOCIN) 1,250 mg in sodium chloride 0.9 % 250 mL IVPB     1,250 mg 166.7 mL/hr over 90 Minutes Intravenous  Once 02/03/16 0905 02/03/16 1211   02/01/16 2203  vancomycin (VANCOCIN) 1 GM/200ML IVPB    Comments:  Maude Leriche   : cabinet override      02/01/16 2203 02/02/16 1014   01/31/16 1800  vancomycin (VANCOCIN) IVPB 1000 mg/200 mL premix     1,000 mg 200 mL/hr over 60 Minutes Intravenous Every 12 hours 01/31/16 1344 02/01/16 0633   01/31/16 0715  vancomycin (VANCOCIN) IVPB 1000 mg/200 mL premix     1,000 mg 200 mL/hr over 60 Minutes Intravenous To Surgery 01/30/16  0729 01/31/16 0846       Kayla Checkovich, PA-C  02/04/2016 8:08 AM

## 2016-02-04 NOTE — Progress Notes (Signed)
Occupational Therapy Treatment Patient Details Name: [name] MRN: [id number] DOB: 1950-12-31 Today's Date: 02/04/2016    History of present illness Adm 01/28/16 with RLE claudication/pain. 2/8 angiogram with multiple blockages; 2/10 Rt fem-pop BPG  PMHx- PVD (previous LE stents), afib on anticoagulation, CVA, MI, Lt knee surgery,    OT comments  Session focused on education greatly due to pending transfusion and Hgb currently 6.6 . Pt participation with teach back and engaged in education. Pt very much plans to use 3n1 in multiple uses after education.    Follow Up Recommendations  SNF (question need for SNF since CIR denied)    Equipment Recommendations  3 in 1 bedside comode;Other (comment) (RW)    Recommendations for Other Services      Precautions / Restrictions Precautions Precautions: Fall       Mobility Bed Mobility                  Transfers                      Balance                                   ADL                                         General ADL Comments: pt currently with Hgb below 8 pending transfusion with symptoms. pt agreeable to [facility] education and review of handout. Pt educated taht demonstration of EC will be the focus of next session. pt and therapist problem solving home setup. Recommend 3n1 for bedside and for bathing.       Vision                     Perception     Praxis      Cognition   Behavior During Therapy: Flat affect Overall Cognitive Status: Within Functional Limits for tasks assessed                       Extremity/Trunk Assessment               Exercises     Shoulder Instructions       General Comments      Pertinent Vitals/ Pain       Pain Assessment: Faces Faces Pain Scale: Hurts even more Pain Location: R LE Pain Descriptors / Indicators: Constant  Home Living                                          Prior  Functioning/Environment              Frequency Min 2X/week     Progress Toward Goals  OT Goals(current goals can now be found in the care plan section)  Progress towards OT goals: Not progressing toward goals - comment (hgb low)  Acute Rehab OT Goals Patient Stated Goal: to get rehab OT Goal Formulation: With patient Time For Goal Achievement: 02/17/16 Potential to Achieve Goals: Good ADL Goals Pt Will Perform Grooming: with modified independence;standing Pt Will Perform Upper Body Dressing: with modified independence;sitting Pt Will Perform Lower Body Dressing: with modified independence;with  adaptive equipment;sit to/from stand Pt Will Transfer to Toilet: with modified independence;ambulating;bedside commode  Plan Discharge plan needs to be updated    Co-evaluation                 End of Session Equipment Utilized During Treatment: Oxygen   Activity Tolerance Patient tolerated treatment well   Patient Left in bed;with call bell/phone within reach;with bed alarm set   Nurse Communication Mobility status;Precautions        Time: [id number] OT Time Calculation (min): 9 min  Charges: OT General Charges $OT Visit: 1 Procedure OT Treatments $Self Care/Home Management : 8-22 mins  Parke Poisson B 02/04/2016, 1:27 PM    Jeri Modena   OTR/L Pager: [phone] Office: [phone] .

## 2016-02-04 NOTE — Progress Notes (Signed)
VASCULAR LAB PRELIMINARY  ARTERIAL  ABI completed:    RIGHT    LEFT    PRESSURE WAVEFORM  PRESSURE WAVEFORM  BRACHIAL 143 Triphasic BRACHIAL 146 Triphasic  DP 118 Triphasic DP 111 Triphasic  PT 127 Triphasic PT 81 Biphasic    RIGHT LEFT  ABI 0.87 0.76   ABIs indicate a mild reduction in arterila flow on the right and a mild to moderate reduction on the left. Doppler waveforms are within normal limits bilaterally at rest.  Neosha Switalski, RVS 02/04/2016, 2:15 PM

## 2016-02-04 NOTE — Progress Notes (Signed)
Rehab Admissions Coordinator Note:  Patient was screened by Cleatrice Burke for appropriateness for an Inpatient Acute Rehab Consult per PT recommendation.   At this time, we are recommending HH. [facility] will unlikely approve an inpt rehab admission for this diagnosis or this functional level.  Cleatrice Burke 02/04/2016, 8:09 AM  I can be reached at [phone].

## 2016-02-04 NOTE — Care Management Note (Addendum)
Case Management Note  Patient Details  Name: [name] MRN: [id number] Date of Birth: Dec 17, 1951  Subjective/Objective:     Date:  02/04/16 Spoke with patient at the bedside.  Introduced self as Tourist information centre manager and explained role in discharge planning and how to be reached.  Verified patient lives in [place] with two grand daughters who are in school and they are not usually home. Has rolling walker in her room, she has a arm gripper at home. Expressed potential need for a bedside commode. Verified patient anticipates to go to a SNF at time of discharge.    Patient denied needing help with their medication.  Patient is driven by R Cats or her sister to MD appointments.  Verified patient has PCP Octavio Graves. Patient states she would like to go to SNF at [facility], she prefers [facility] if possible, Referral given to [facility].  NCM also gave patient the request for indep assessment for pcs services, which she will need to fill out and have her pcp fill out.    Plan: CM will continue to follow for discharge planning and [facility] resources.                Action/Plan:   Expected Discharge Date:                  Expected Discharge Plan:  Skilled Nursing Facility  In-House Referral:  Clinical Social Work  Discharge planning Services  CM Consult  Post Acute Care Choice:    Choice offered to:     DME Arranged:    DME Agency:     HH Arranged:    [place] Agency:     Status of Service:  Completed, signed off  Medicare Important Message Given:  Yes Date Medicare IM Given:    Medicare IM give by:    Date Additional Medicare IM Given:    Additional Medicare Important Message give by:     If discussed at [place] of Stay Meetings, dates discussed:    Additional Comments:  Zenon Mayo, RN 02/04/2016, 11:34 AM

## 2016-02-05 DIAGNOSIS — Z7901 Long term (current) use of anticoagulants: Secondary | ICD-10-CM

## 2016-02-05 DIAGNOSIS — I2510 Atherosclerotic heart disease of native coronary artery without angina pectoris: Secondary | ICD-10-CM

## 2016-02-05 LAB — BASIC METABOLIC PANEL
Anion gap: 14 (ref 5–15)
BUN: 14 mg/dL (ref 6–20)
CHLORIDE: 107 mmol/L (ref 101–111)
CO2: 19 mmol/L — ABNORMAL LOW (ref 22–32)
Calcium: 8.2 mg/dL — ABNORMAL LOW (ref 8.9–10.3)
Creatinine, Ser: 0.92 mg/dL (ref 0.44–1.00)
GFR calc Af Amer: >60 mL/min (ref >60)
GLUCOSE: 95 mg/dL (ref 65–99)
POTASSIUM: 3.7 mmol/L (ref 3.5–5.1)
Sodium: 140 mmol/L (ref 135–145)

## 2016-02-05 LAB — TRANSFUSION REACTION
DAT C3: NEGATIVE
POST RXN DAT IGG: NEGATIVE

## 2016-02-05 LAB — PREPARE RBC (CROSSMATCH)

## 2016-02-05 LAB — CBC
HEMATOCRIT: 27.9 % — AB (ref 36.0–46.0)
Hemoglobin: 8.4 g/dL — ABNORMAL LOW (ref 12.0–15.0)
MCH: 21 pg — ABNORMAL LOW (ref 26.0–34.0)
MCHC: 30.1 g/dL (ref 30.0–36.0)
MCV: 69.8 fL — AB (ref 78.0–100.0)
Platelets: 371 10*3/uL (ref 150–400)
RBC: 4 MIL/uL (ref 3.87–5.11)
RDW: 20.9 % — AB (ref 11.5–15.5)
WBC: 20.3 10*3/uL — ABNORMAL HIGH (ref 4.0–10.5)

## 2016-02-05 MED ORDER — DIPHENHYDRAMINE HCL 50 MG/ML IJ SOLN: 25.0000 mg | Freq: Four times a day (QID) | INTRAMUSCULAR | Status: DC | PRN

## 2016-02-05 MED ORDER — LISINOPRIL 5 MG PO TABS
5.0000 mg | ORAL_TABLET | Freq: Every day | ORAL | Status: DC
  Administered 2016-02-05 – 2016-02-07 (×3): 5 mg via ORAL
  Filled 2016-02-05 (×3): qty 1

## 2016-02-05 MED ORDER — HYDROCHLOROTHIAZIDE 12.5 MG PO CAPS
12.5000 mg | ORAL_CAPSULE | Freq: Every day | ORAL | Status: DC
  Administered 2016-02-05 – 2016-02-06 (×2): 12.5 mg via ORAL
  Filled 2016-02-05 (×2): qty 1

## 2016-02-05 MED ORDER — SODIUM CHLORIDE 0.9 % IV SOLN
Freq: Once | INTRAVENOUS | Status: AC
  Administered 2016-02-05: 08:00:00 via INTRAVENOUS

## 2016-02-05 NOTE — Progress Notes (Signed)
Report called to Tiffany, RN for [place] room 6.

## 2016-02-05 NOTE — Progress Notes (Addendum)
[facility]  Subjective  - Transfusion reaction 02/04/2016 transfusion was stopped.  HGB this am reported 6.6.     Objective 127/55 85 99.9 F (37.7 C) (Oral) 16 95%  Intake/Output Summary (Last 24 hours) at 02/05/16 [id number] Last data filed at 02/05/16 0356  Gross per 24 hour  Intake    755 ml  Output    600 ml  Net    155 ml    Palpable DP  Incisions are healing well Minimal edema bilaterally   Assessment/Planning: POD # 5 Right CFA to below knee Popliteal artery bypass graft with propaten and intraoperative arteriogram with right CFA and profunda endarterectomy with patch angioplasty and exam of the saphenous but not removed.  Plan transfusion 2 units PRBC with Benadryl pre medication and between.  The patient has an allergy to prednisone reaction unknown.   Transfer to 2W Will order CBC and BMET after transfusion.   Laurence Slate [facility] 02/05/2016 8:21 AM --  Laboratory Lab Results:  Recent Labs  02/03/16 0505 02/04/16 0552  WBC 20.7* 16.7*  HGB 7.1* 6.6*  HCT 24.8* 23.5*  PLT 309 276   BMET  Recent Labs  02/03/16 0505 02/04/16 0552  NA 138 138  K 3.9 4.0  CL 112* 108  CO2 18* 19*  GLUCOSE 120* 110*  BUN 19 21*  CREATININE 1.46* 1.26*  CALCIUM 7.9* 7.9*    COAG Lab Results  Component Value Date   INR 1.09 01/31/2016   INR 1.25 01/28/2016   INR 1.15 06/25/2015   No results found for: PTT   Agree with above assessment Bypass and right leg is patent with 2-3+ palpable posterior tibial pulse right foot which is well perfused. Incisions healing nicely. Patient needs to get out of bed and ambulate We'll transfuse 2 units red blood cells today We'll have case manager see patient regarding discharge planning since she lives essentially alone  Hopefully if patient begins to ambulate today and tomorrow we can DC home by Friday  Or to   Rehabilitation or skilled nursing facility

## 2016-02-05 NOTE — Progress Notes (Signed)
Patient just finished working with PT.  Nurse Tech instructed patient is ready for transfer after PT is completed.

## 2016-02-05 NOTE — Progress Notes (Signed)
OT Cancellation Note  Patient Details Name: [name] MRN: [id number] DOB: [birth year]   Cancelled Treatment:    Reason Eval/Treat Not Completed: Medical issues which prohibited therapy;Patient not medically ready (hgb 6.6) Reaction to second unit of blood during transfusion. Pt not feeling well this AM. Ot to continue to follow acutely  Vonita Moss   OTR/L Pager: [phone] Office: [phone] . ' 02/05/2016, 7:40 AM

## 2016-02-05 NOTE — Progress Notes (Signed)
Physical Therapy Treatment Patient Details Name: [name] MRN: [id number] DOB: [birth year] Today's Date: 02/05/2016    History of Present Illness Adm 01/28/16 with RLE claudication/pain. 2/8 angiogram with multiple blockages; 2/10 Rt fem-pop BPG.  On 2/12 pt underwent thrombectomy Rt femoropopliteal bypass graft, endarterectomy below knee popliteal arter, and patch angioplasty of below knee popliteal artery.  PMHx- PVD (previous LE stents), afib on anticoagulation, CVA, MI, Lt knee surgery.    PT Comments    Ms. Rodenberg ambulated 50 ft in hallway today w/ min guard assist and max encouragement.  She requires min assist to steady RW w/ sit<>stand.  Updated follow up recommendations to SNF as pt has no assist available at home and requires the assist above.     Follow Up Recommendations  Supervision for mobility/OOB;SNF     Equipment Recommendations  Rolling walker with 5" wheels;3in1 (PT)    Recommendations for Other Services       Precautions / Restrictions Precautions Precautions: Fall Restrictions Weight Bearing Restrictions: No    Mobility  Bed Mobility Overal bed mobility: Needs Assistance Bed Mobility: Supine to Sit     Supine to sit: Supervision     General bed mobility comments: Increased time w/ use of bed rail  Transfers Overall transfer level: Needs assistance Equipment used: Rolling walker (2 wheeled) Transfers: Sit to/from Stand Sit to Stand: Min assist         General transfer comment: Pt requires min assist to hold RW steady and is slow to stand.  Does not place weight through Rt LE.  Ambulation/Gait Ambulation/Gait assistance: Min guard Ambulation Distance (Feet): 50 Feet Assistive device: Rolling walker (2 wheeled) Gait Pattern/deviations: Step-to pattern;Decreased stride length;Decreased weight shift to right;Trunk flexed;Antalgic;Decreased stance time - right   Gait velocity interpretation: <1.8 ft/sec, indicative of risk for recurrent  falls General Gait Details: Pt initially TDWB w/ Rt LE but is able to place foot flat for last 30 ft w/ cues to do so.  Pt requires multiple standing breaks and redirecting to remain focused on task.    Stairs            Wheelchair Mobility    Modified Rankin (Stroke Patients Only)       Balance Overall balance assessment: Needs assistance Sitting-balance support: No upper extremity supported;Feet supported Sitting balance-Leahy Scale: Good     Standing balance support: Bilateral upper extremity supported;During functional activity Standing balance-Leahy Scale: Poor Standing balance comment: Relies on RW for support                    Cognition Arousal/Alertness: Awake/alert Behavior During Therapy: WFL for tasks assessed/performed Overall Cognitive Status: Within Functional Limits for tasks assessed                      Exercises General Exercises - Lower Extremity Ankle Circles/Pumps: AROM;Right;Seated;10 reps Long Arc Quad: AROM;Right;10 reps;Seated    General Comments General comments (skin integrity, edema, etc.): VSS      Pertinent Vitals/Pain Pain Assessment: 0-10 Pain Score: 8  Pain Location: Rt LE, abdomen Pain Descriptors / Indicators: Tightness;Sore Pain Intervention(s): Limited activity within patient's tolerance;Monitored during session;Repositioned (notified RN of abdominal pain (only w/ bed mobility))    Home Living                      Prior Function            PT Goals (current goals can now be  found in the care plan section) Acute Rehab PT Goals Patient Stated Goal: to get rehab PT Goal Formulation: With patient Time For Goal Achievement: 02/05/16 Potential to Achieve Goals: Good Progress towards PT goals: Progressing toward goals    Frequency  Min 3X/week    PT Plan Discharge plan needs to be updated;Frequency needs to be updated    Co-evaluation             End of Session Equipment Utilized  During Treatment: Gait belt;Oxygen Activity Tolerance: Patient limited by fatigue;Patient limited by pain Patient left: with call bell/phone within reach;in bed;with bed alarm set;Other (comment) (sitting on side of bed eating lunch)     Time: [id number] PT Time Calculation (min) (ACUTE ONLY): 33 min  Charges:  $Gait Training: 23-37 mins                    G Codes:      Joslyn Hy PT, [place] [id number] Pager: [phone] 02/05/2016, 4:14 PM

## 2016-02-05 NOTE — Progress Notes (Signed)
Spoke with [name] Lins, PA for group.  Telephone order received to discontinue RBC transfusion order.  Current H/H reported to Ms. Collins.

## 2016-02-05 NOTE — Progress Notes (Signed)
Patient Profile: 65 y/o female with h/o PVD, polycythemia vera and PAF on chronic oral anticoagulation with Eliquis. She underwent stenting of her ostium/proximal and mid right SFA with Viabahn covered stents in July 2016 per Dr. [name]. She presented to our office 01/28/16 with a complaint of acute onset right lower extremity pain with numbness and discoloration in her right foot. Doppler studies revealed a right ABI of 0.24 with an occluded right SFA. Stent thrombosis was suspected and she was admitted for repeat angiography and potential surgical revascularization. She was found to have an occlusion of the previously placed covered stent of the right SFA throughout its course with 3 vessel runoff below the knee with evidence of some embolization of distal branches. She is now s/p rt below-knee popliteal artery bypass per Dr. Kellie Simmering. On post op day 1, she developed a mottled right lower extremity with loss of Doppler flow in the right foot. She was taken back rgently to the OR for repeat surgical embolectomy, per Dr. Scot Dock.  Subjective: No major complaints currently. Denies any fever of chills.  Objective: Vital signs in last 24 hours: Temp:  [98.2 F (36.8 C)-101.3 F (38.5 C)] 99.9 F (37.7 C) (02/15 0315) Pulse Rate:  [85-100] 85 (02/14 2100) Resp:  [16-24] 16 (02/14 2100) BP: (125-169)/(51-63) 127/55 mmHg (02/14 2100) SpO2:  [93 %-99 %] 95 % (02/14 2100) Last BM Date: 01/29/16  Intake/Output from previous day: 02/14 0701 - 02/15 0700 In: 755 [P.O.:480; Blood:275] Out: 600 [Urine:600] Intake/Output this shift: Total I/O In: 3 [I.V.:3] Out: -   Medications Current Facility-Administered Medications  Medication Dose Route Frequency Provider Last Rate Last Dose  . 0.9 %  sodium chloride infusion  250 mL Intravenous PRN Lorretta Harp, MD      . 0.9 %  sodium chloride infusion  500 mL Intravenous Once PRN Hulen Shouts Rhyne, PA-C      . acetaminophen (TYLENOL) tablet 650 mg   650 mg Oral Q4H PRN Evelene Croon Barrett, PA-C   650 mg at 02/01/16 1028  . ALPRAZolam (XANAX) tablet 0.25 mg  0.25 mg Oral BID PRN Evelene Croon Barrett, PA-C      . alum & mag hydroxide-simeth (MAALOX/MYLANTA) 200-200-20 MG/5ML suspension 15-30 mL  15-30 mL Oral Q2H PRN Samantha J Rhyne, PA-C      . apixaban (ELIQUIS) tablet 5 mg  5 mg Oral BID Jake Church Masters, RPH   5 mg at 02/05/16 0919  . aspirin EC tablet 81 mg  81 mg Oral Daily Hulen Shouts Rhyne, PA-C   81 mg at 02/05/16 0919  . bisacodyl (DULCOLAX) suppository 10 mg  10 mg Rectal Daily PRN Samantha J Rhyne, PA-C      . diltiazem (CARDIZEM CD) 24 hr capsule 180 mg  180 mg Oral q morning - 10a Rogelia Mire, NP   180 mg at 02/05/16 0921  . diphenhydrAMINE (BENADRYL) injection 25 mg  25 mg Intravenous Q6H PRN Ulyses Amor, PA-C      . docusate sodium (COLACE) capsule 200 mg  200 mg Oral QHS Rogelia Mire, NP   200 mg at 02/04/16 2148  . ezetimibe (ZETIA) tablet 10 mg  10 mg Oral QHS Rogelia Mire, NP   10 mg at 02/04/16 2148  . flecainide (TAMBOCOR) tablet 50 mg  50 mg Oral BID Rogelia Mire, NP   50 mg at 02/05/16 [id number]  . guaiFENesin-dextromethorphan (ROBITUSSIN DM) 100-10 MG/5ML syrup 15 mL  15 mL Oral  Q4H PRN Samantha J Rhyne, PA-C      . hydrALAZINE (APRESOLINE) injection 10 mg  10 mg Intravenous Q8H PRN Almyra Deforest, PA      . HYDROmorphone (DILAUDID) injection 0.5-1 mg  0.5-1 mg Intravenous Q2H PRN Angelia Mould, MD      . magnesium sulfate IVPB 2 g 50 mL  2 g Intravenous Daily PRN Angelia Mould, MD      . morphine 2 MG/ML injection 2-3 mg  2-3 mg Intravenous Q2H PRN Gabriel Earing, PA-C   2 mg at 02/02/16 1829  . nitroGLYCERIN (NITROSTAT) SL tablet 0.4 mg  0.4 mg Sublingual Q5 Min x 3 PRN Rhonda G Barrett, PA-C      . ondansetron (ZOFRAN) injection 4 mg  4 mg Intravenous Q6H PRN Evelene Croon Barrett, PA-C   4 mg at 02/02/16 0811  . oxyCODONE-acetaminophen (PERCOCET/ROXICET) 5-325 MG per tablet 1-2 tablet  1-2  tablet Oral Q4H PRN Angelia Mould, MD   2 tablet at 02/04/16 1815  . phenol (CHLORASEPTIC) mouth spray 1 spray  1 spray Mouth/Throat PRN Samantha J Rhyne, PA-C      . polyvinyl alcohol (LIQUIFILM TEARS) 1.4 % ophthalmic solution 1 drop  1 drop Both Eyes PRN Rogelia Mire, NP      . potassium chloride SA (K-DUR,KLOR-CON) CR tablet 20-40 mEq  20-40 mEq Oral Daily PRN Samantha J Rhyne, PA-C      . sodium chloride flush (NS) 0.9 % injection 3 mL  3 mL Intravenous Q12H Rhonda G Barrett, PA-C   3 mL at 02/05/16 0926  . traMADol (ULTRAM) tablet 50 mg  50 mg Oral TID PRN Rogelia Mire, NP   50 mg at 02/04/16 0811  . vancomycin (VANCOCIN) IVPB 750 mg/150 ml premix  750 mg Intravenous Q24H Jake Church Masters, RPH   750 mg at 02/05/16 0915  . zolpidem (AMBIEN) tablet 5 mg  5 mg Oral QHS PRN Lonn Georgia, PA-C        PE: General appearance: alert, cooperative and no distress Neck: no carotid bruit and no JVD Lungs: clear to auscultation bilaterally Heart: regular rhythm, reg rate Extremities: 2+ LEE on the right Pulses: 2+ and symmetric Skin: warm and dry Neurologic: Grossly normal  Lab Results:   Recent Labs  02/03/16 0505 02/04/16 0552  WBC 20.7* 16.7*  HGB 7.1* 6.6*  HCT 24.8* 23.5*  PLT 309 276   BMET  Recent Labs  02/03/16 0505 02/04/16 0552  NA 138 138  K 3.9 4.0  CL 112* 108  CO2 18* 19*  GLUCOSE 120* 110*  BUN 19 21*  CREATININE 1.46* 1.26*  CALCIUM 7.9* 7.9*    Assessment/Plan  Active Problems:   Claudication (HCC)  1. PVD: status post Right femoral to below-knee popliteal artery bypass with repeat surgical embolectomy.  Vascular surgery managing post surgical care.   2. PAF: maintaining sinus rhythm. Rate is controlled. Continue Cardizem and flecainide for rate/rhythm control. Apixaban restarted 02/03/16. Will need to monitor H/H closely, given her anemia.    3. HTN: stable. Continue Cardizem. PRN IV hydralazine ordered if needed.   4.  Anemia: Hbg dropped to 6.6 yesterday and patient required transfusion. She was ordered to get 2 units however, during infusion of 1st unit, Pt's temperature spiked from 98.9 to 101.3, blood was stopped. She only received 1 unit of blood. F/u CBC pending. If further decline, will need to order FOBT and GI consult.    5. HLD:  intolerant of statins. We can consider referring her to our lipid clinic for PCSK-9 therapy as an outpatient.    LOS: 8 days    Brittainy M. Ladoris Gene Pager [phone] 02/05/2016 9:43 AM

## 2016-02-06 ENCOUNTER — Ambulatory Visit: Payer: Medicare Other | Admitting: Pharmacist Clinician (PhC)/ Clinical Pharmacy Specialist

## 2016-02-06 DIAGNOSIS — D45 Polycythemia vera: Secondary | ICD-10-CM

## 2016-02-06 DIAGNOSIS — Z8673 Personal history of transient ischemic attack (TIA), and cerebral infarction without residual deficits: Secondary | ICD-10-CM

## 2016-02-06 LAB — CBC
HEMATOCRIT: 26.7 % — AB (ref 36.0–46.0)
Hemoglobin: 7.7 g/dL — ABNORMAL LOW (ref 12.0–15.0)
MCH: 19.8 pg — AB (ref 26.0–34.0)
MCHC: 28.8 g/dL — AB (ref 30.0–36.0)
MCV: 68.6 fL — AB (ref 78.0–100.0)
Platelets: 365 10*3/uL (ref 150–400)
RBC: 3.89 MIL/uL (ref 3.87–5.11)
RDW: 20.9 % — AB (ref 11.5–15.5)
WBC: 17.2 10*3/uL — ABNORMAL HIGH (ref 4.0–10.5)

## 2016-02-06 LAB — BASIC METABOLIC PANEL
Anion gap: 11 (ref 5–15)
BUN: 12 mg/dL (ref 6–20)
CALCIUM: 8.2 mg/dL — AB (ref 8.9–10.3)
CHLORIDE: 106 mmol/L (ref 101–111)
CO2: 22 mmol/L (ref 22–32)
CREATININE: 0.85 mg/dL (ref 0.44–1.00)
GFR calc Af Amer: >60 mL/min (ref >60)
GFR calc non Af Amer: >60 mL/min (ref >60)
GLUCOSE: 101 mg/dL — AB (ref 65–99)
Potassium: 3.5 mmol/L (ref 3.5–5.1)
Sodium: 139 mmol/L (ref 135–145)

## 2016-02-06 LAB — HEMOGLOBIN AND HEMATOCRIT, BLOOD
HCT: 30.5 % — ABNORMAL LOW (ref 36.0–46.0)
HEMOGLOBIN: 8.9 g/dL — AB (ref 12.0–15.0)

## 2016-02-06 MED ORDER — HYDROCHLOROTHIAZIDE 12.5 MG PO CAPS
12.5000 mg | ORAL_CAPSULE | Freq: Once | ORAL | Status: AC
  Administered 2016-02-06: 12.5 mg via ORAL
  Filled 2016-02-06: qty 1

## 2016-02-06 MED ORDER — SALINE SPRAY 0.65 % NA SOLN
1.0000 | NASAL | Status: DC | PRN
  Filled 2016-02-06: qty 44

## 2016-02-06 MED ORDER — TRAMADOL HCL 50 MG PO TABS: 50.0000 mg | ORAL_TABLET | ORAL | Status: DC

## 2016-02-06 MED ORDER — HYDROCHLOROTHIAZIDE 12.5 MG PO CAPS
25.0000 mg | ORAL_CAPSULE | Freq: Every day | ORAL | Status: DC
  Administered 2016-02-07: 25 mg via ORAL
  Filled 2016-02-06: qty 2

## 2016-02-06 NOTE — Progress Notes (Signed)
PATIENT ID: [id number] with polycythemia vera, PAD, hypertension, and h and PAF on Eliquis here with stenting of the R SFA complicated by stent thrombosis.  She subsequently underwent R below the knee popliteal artery bypass that was complicated by embolization.  Now s/p successful embolectomy on 02/01/16.  INTERVAL HISTORY: Started on vancomycin for leukocytosis and low-grade fever.  SUBJECTIVE:  [name] is feeling well this AM.  She has noted some mild epistaxis.  Her energy level is improved.   PHYSICAL EXAM Filed Vitals:   02/05/16 1500 02/05/16 1946 02/06/16 0457 02/06/16 0923  BP: 178/60 186/59 179/62 163/59  Pulse: 101 100 91 89  Temp: 98.7 F (37.1 C) 99.1 F (37.3 C) 98.2 F (36.8 C)   TempSrc: Oral Oral Oral   Resp: 18 18 18    Height:      Weight:   65.2 kg (143 lb 11.8 oz)   SpO2: 90% 92% 91% 94%   General:  Well-appearing. NAD Neck: JVP to mid neck at 45 degrees Lungs:  Faint bibasilar crackles. Heart:  RRR.  No m/r/g Abdomen:  Soft, NT, ND.  +BS3 Extremities:  1+ pitting edema on the R.  Incisions without erythema or drainage.  LABS: Lab Results  Component Value Date   TROPONINI 0.92* 04/27/2013   Results for orders placed or performed during the hospital encounter of 01/28/16 (from the past 24 hour(s))  Basic metabolic panel     Status: Abnormal   Collection Time: 02/05/16 11:41 AM  Result Value Ref Range   Sodium 140 135 - 145 mmol/L   Potassium 3.7 3.5 - 5.1 mmol/L   Chloride 107 101 - 111 mmol/L   CO2 19 (L) 22 - 32 mmol/L   Glucose, Bld 95 65 - 99 mg/dL   BUN 14 6 - 20 mg/dL   Creatinine, Ser 0.92 0.44 - 1.00 mg/dL   Calcium 8.2 (L) 8.9 - 10.3 mg/dL   GFR calc non Af Amer >60 >60 mL/min   GFR calc Af Amer >60 >60 mL/min   Anion gap 14 5 - 15  CBC     Status: Abnormal   Collection Time: 02/05/16 11:41 AM  Result Value Ref Range   WBC 20.3 (H) 4.0 - 10.5 K/uL   RBC 4.00 3.87 - 5.11 MIL/uL   Hemoglobin 8.4 (L) 12.0 - 15.0 g/dL   HCT 27.9 (L) 36.0 -  46.0 %   MCV 69.8 (L) 78.0 - 100.0 fL   MCH 21.0 (L) 26.0 - 34.0 pg   MCHC 30.1 30.0 - 36.0 g/dL   RDW 20.9 (H) 11.5 - 15.5 %   Platelets 371 150 - 400 K/uL  CBC     Status: Abnormal   Collection Time: 02/06/16  3:50 AM  Result Value Ref Range   WBC 17.2 (H) 4.0 - 10.5 K/uL   RBC 3.89 3.87 - 5.11 MIL/uL   Hemoglobin 7.7 (L) 12.0 - 15.0 g/dL   HCT 26.7 (L) 36.0 - 46.0 %   MCV 68.6 (L) 78.0 - 100.0 fL   MCH 19.8 (L) 26.0 - 34.0 pg   MCHC 28.8 (L) 30.0 - 36.0 g/dL   RDW 20.9 (H) 11.5 - 15.5 %   Platelets 365 150 - 400 K/uL  Basic metabolic panel     Status: Abnormal   Collection Time: 02/06/16  3:50 AM  Result Value Ref Range   Sodium 139 135 - 145 mmol/L   Potassium 3.5 3.5 - 5.1 mmol/L   Chloride 106 101 -  111 mmol/L   CO2 22 22 - 32 mmol/L   Glucose, Bld 101 (H) 65 - 99 mg/dL   BUN 12 6 - 20 mg/dL   Creatinine, Ser 0.85 0.44 - 1.00 mg/dL   Calcium 8.2 (L) 8.9 - 10.3 mg/dL   GFR calc non Af Amer >60 >60 mL/min   GFR calc Af Amer >60 >60 mL/min   Anion gap 11 5 - 15    Intake/Output Summary (Last 24 hours) at 02/06/16 1055 Last data filed at 02/06/16 0730  Gross per 24 hour  Intake    720 ml  Output      0 ml  Net    720 ml     ASSESSMENT AND PLAN:  Active Problems:   PAD (peripheral artery disease), Lt carotid 50-70%: , Hx stent Lt exteral iliac & RSFA stent, known occl. LSFA    Hyperlipidemia   History of CVA (cerebrovascular accident)   PAF (paroxysmal atrial fibrillation), maintaing SR   Chronic anticoagulation, new- eliquis   CAD (coronary artery disease), per cath 04/27/13, non obstructive disease 20-30% LM; LAD 30%; 50-60% OM2; RCA 40%; EF 65-70%   Polycythemia vera (HCC)   Essential hypertension   PVD (peripheral vascular disease) ([place])   # PAD: [name] is improving.  She now has palpable DP/PT pulses bilaterally.  Likely home tomorrow per Vascular Surgery.  # PAF: Remains in sinus rhythm.  Continue flecainde, cardizem and Eliquis.  H/H mildly down  this AM.  Will repeat this afternoon.  Saline nasal spray for mild epistaxis.  # Hypertension: BP elevated today.  WIll increase HCTZ to 25mg  which should also help with mild volume overload.  # Hyperlipidemia:  Cannot tolerate statins.  We can consider referring her to our lipid clinic for PCSK-9 therapy as an outpatient.   # Leukocytosis: WBC is at baseline.  Chronically elevated.  She did have a low-grade fever to 101.3.  Started empirically on Vancomycin.  Unlikely to continue at discharge unless directed by Vascular Surgery.    Time spent: 30 minutes-Greater than 50% of this time was spent in counseling, explanation of diagnosis, planning of further management, and coordination of care.    Trinity Hyland C. Oval Linsey, MD, [facility] 02/06/2016 10:55 AM

## 2016-02-06 NOTE — Progress Notes (Signed)
Physical Therapy Treatment Patient Details Name: [name] MRN: [id number] DOB: May 31, 1951 Today's Date: 02/06/2016    History of Present Illness Adm 01/28/16 with RLE claudication/pain. 2/8 angiogram with multiple blockages; 2/10 Rt fem-pop BPG.  On 2/12 pt underwent thrombectomy Rt femoropopliteal bypass graft, endarterectomy below knee popliteal arter, and patch angioplasty of below knee popliteal artery.  PMHx- PVD (previous LE stents), afib on anticoagulation, CVA, MI, Lt knee surgery.    PT Comments    Patient reports planned d/c home tomorrow and will have some help from girls that stay with her.  Feel mobility has improved to allow safe d/c home with only intermittent help.  Equipment already in room and pt reports can borrow tub seat from friend and has grabbars.  Will keep on case load in case not d/c tomorrow.  Follow Up Recommendations  Supervision - Intermittent;Home health PT     Equipment Recommendations  Rolling walker with 5" wheels;3in1 (PT)    Recommendations for Other Services       Precautions / Restrictions Precautions Precautions: Fall    Mobility  Bed Mobility Overal bed mobility: Modified Independent             General bed mobility comments: use of rail for sit to supine and to pull up in bed; discussed no rail at home, but plans to sleep in recliner few nights  Transfers Overall transfer level: Needs assistance Equipment used: Rolling walker (2 wheeled) Transfers: Sit to/from Stand Sit to Stand: Supervision         General transfer comment: stood initially from bed pushing up appropriately, from w/c pulled up on walker and needed assist to prevent walker from tipping; discussed appropriate technique for safety  Ambulation/Gait Ambulation/Gait assistance: Modified independent (Device/Increase time) Ambulation Distance (Feet): 75 Feet Assistive device: Rolling walker (2 wheeled) Gait Pattern/deviations: Step-to pattern;Decreased stride  length;Antalgic     General Gait Details: weight on toes on R progressing to foot flat at times, but only when out in front due to tightness and edema in leg   Stairs Stairs: Yes Stairs assistance: Supervision Stair Management: One rail Right;Step to pattern;Sideways Number of Stairs: 3 General stair comments: educated about technique and after pt negotiated one step less assist needed; stayed close for safety  Wheelchair Mobility    Modified Rankin (Stroke Patients Only)       Balance       Sitting balance - Comments: can stand static without UE support with R foot out and weight on L     Standing balance-Leahy Scale: Fair                      Cognition Arousal/Alertness: Awake/alert Behavior During Therapy: WFL for tasks assessed/performed Overall Cognitive Status: Within Functional Limits for tasks assessed                      Exercises      General Comments        Pertinent Vitals/Pain Faces Pain Scale: Hurts even more Pain Location: R LE Pain Descriptors / Indicators: Tightness Pain Intervention(s): Monitored during session;Premedicated before session    Home Living                      Prior Function            PT Goals (current goals can now be found in the care plan section) Progress towards PT goals: Progressing toward goals  Frequency  Min 3X/week    PT Plan Discharge plan needs to be updated    Co-evaluation             End of Session   Activity Tolerance: Patient tolerated treatment well Patient left: in bed;with call bell/phone within reach     Time: 1525-1550 PT Time Calculation (min) (ACUTE ONLY): 25 min  Charges:  $Gait Training: 23-37 mins                    G Codes:      Reginia Naas 03/07/16, 4:10 PM  Magda Kiel, [place] 03-07-16

## 2016-02-06 NOTE — Progress Notes (Signed)
Received call 02/05/16  about post blood reaction 02/04/16 that Dr.Patrick/patholgist confirmed patient may receive future blood transfusion with premedication benadryl and steroids.

## 2016-02-06 NOTE — Progress Notes (Addendum)
  Progress Note    02/06/2016 9:45 AM 5 Days Post-Op  Subjective:  C/o swelling in right leg  Tm 99.1 now afebrile HR 80's-100 NSR/ST [id number] systolic [id number] RA  Filed Vitals:   02/06/16 0457 02/06/16 0923  BP: 179/62 163/59  Pulse: 91 89  Temp: 98.2 F (36.8 C)   Resp: 18     Physical Exam: Cardiac:  regular Lungs:  Non labored Incisions:  All are healing nicely Extremities:  Right foot is warm with easily palpable right DP pulse   CBC    Component Value Date/Time   WBC 17.2* 02/06/2016 0350   RBC 3.89 02/06/2016 0350   HGB 7.7* 02/06/2016 0350   HCT 26.7* 02/06/2016 0350   PLT 365 02/06/2016 0350   MCV 68.6* 02/06/2016 0350   MCH 19.8* 02/06/2016 0350   MCHC 28.8* 02/06/2016 0350   RDW 20.9* 02/06/2016 0350   LYMPHSABS 1.2 01/28/2016 1724   MONOABS 0.5 01/28/2016 1724   EOSABS 0.5 01/28/2016 1724   BASOSABS 0.2* 01/28/2016 1724    BMET    Component Value Date/Time   NA 139 02/06/2016 0350   K 3.5 02/06/2016 0350   CL 106 02/06/2016 0350   CO2 22 02/06/2016 0350   GLUCOSE 101* 02/06/2016 0350   BUN 12 02/06/2016 0350   CREATININE 0.85 02/06/2016 0350   CREATININE 1.02* 07/26/2015 1022   CALCIUM 8.2* 02/06/2016 0350   GFRNONAA >60 02/06/2016 0350   GFRAA >60 02/06/2016 0350    INR    Component Value Date/Time   INR 1.09 01/31/2016 0240     Intake/Output Summary (Last 24 hours) at 02/06/16 0945 Last data filed at 02/06/16 0730  Gross per 24 hour  Intake    720 ml  Output      0 ml  Net    720 ml     Assessment:  65 y.o. female is s/p:  Right CFA to below knee Popliteal artery bypass graft with propaten and intraoperative arteriogram with right CFA and profunda endarterectomy with patch angioplasty and exam of the saphenous but not removed  5 Days Post-Op  Plan: -pt doing well this morning with easily palpable right DP pulse -feeling better after blood transfusion yesterday -most likely discharge tomorrow from vascular  standpoint-PT recommending SNF as pt does not have assist at home, however, pt is agreeable to HHPT and not SNF. -DVT prophylaxis:  Eliquis -continue leg elevation when not ambulating -acute blood loss anemia improved with 1 unit PRBC (had transfusion rxn to second unit). -pt still with elevated WBC, but has been elevated throughout hospitalization.   Discussed with Dr. Kellie Simmering and will discontinue Abx at this time.     Leontine Locket, PA-C [facility] [phone] 02/06/2016 9:45 AM  3+ right posterior tibial pulse palpable with right foot well perfused Patient beginning to ambulate with a walker and help Surgical incisions all are good  Plan DC either home or to SNF tomorrow if felt neededPatient is afebrile and had elevated white count on admission to hospital so will DC antibiotics

## 2016-02-06 NOTE — Care Management Note (Signed)
Case Management Note Previous CM note initiated by Tomi Bamberger RN, CM  Patient Details  Name: [name] MRN: [id number] Date of Birth: [birth year]  Subjective/Objective:     Date:  02/04/16 Spoke with patient at the bedside.  Introduced self as Tourist information centre manager and explained role in discharge planning and how to be reached.  Verified patient lives in [place] with two grand daughters who are in school and they are not usually home. Has rolling walker in her room, she has a arm gripper at home. Expressed potential need for a bedside commode. Verified patient anticipates to go to a SNF at time of discharge.    Patient denied needing help with their medication.  Patient is driven by R Cats or her sister to MD appointments.  Verified patient has PCP Octavio Graves. Patient states she would like to go to SNF at [facility], she prefers [facility] if possible, Referral given to [place].  NCM also gave patient the request for indep assessment for pcs services, which she will need to fill out and have her pcp fill out.    Plan: CM will continue to follow for discharge planning and [facility] resources.                Action/Plan: S/p Fempop- plan for pt to return home with family and [place] services  Expected Discharge Date:    02/07/16              Expected Discharge Plan:  [place]  In-House Referral:  Clinical Social Work  Discharge planning Services  CM Consult  Post Acute Care Choice:  Durable Medical Equipment, Home Health Choice offered to:  Patient  DME Arranged:  3-N-1, Walker rolling DME Agency:  [place]:  PT [place]:  [place]  Status of Service:  Completed, signed off  Medicare Important Message Given:  Yes Date Medicare IM Given:    Medicare IM give by:    Date Additional Medicare IM Given:    Additional Medicare Important Message give by:     If discussed at [place] of Stay Meetings, dates discussed:     Discharge Disposition: Home/home health   Additional Comments:  02/06/16- 1130- Marvetta Gibbons RN, [name] - orders placed for DME and HH needs- in to speak with pt at bedside- confirmed that pt needs both RW and 3n1 for discharge pt agreeable to have [facility] deliver to room prior to discharge- referral called to [facility] with [facility] for DME needs RW and 3n1- discussed HH-PT- pt states she has not had HH in past- list of [facility] providers for [facility] provided- per pt choice she would like to use [facility]- referral called to Lelan Pons with [facility] for HH-PT with plan for d/c tomorrow 2/17-   Dahlia Client, Romeo Rabon, RN 02/06/2016, 12:09 PM

## 2016-02-06 NOTE — Progress Notes (Signed)
CSW spoke with pt concerning PT recommendation for SNF- pt feels as if she can manage at home just as well as at a facility and is agreeable to getting home health services/equipment- RNCM informed of pt choice to return home when medically stable for DC  CSW also received consult regarding patients grandchildren (ages 37 and 59) who were living with her- pt stated that her grandchildren are currently living with the patients sister in law while she recovers and there are no concerns about their care at this time  CSW signing off- please reconsult if further needs arise.  Domenica Reamer, [place] Social Worker [phone]

## 2016-02-07 ENCOUNTER — Encounter: Payer: Self-pay | Admitting: Vascular Surgery

## 2016-02-07 ENCOUNTER — Other Ambulatory Visit: Payer: Self-pay | Admitting: Physician Assistant

## 2016-02-07 ENCOUNTER — Telehealth: Payer: Self-pay | Admitting: Vascular Surgery

## 2016-02-07 DIAGNOSIS — Z789 Other specified health status: Secondary | ICD-10-CM

## 2016-02-07 DIAGNOSIS — D62 Acute posthemorrhagic anemia: Secondary | ICD-10-CM

## 2016-02-07 MED ORDER — HYDROCHLOROTHIAZIDE 12.5 MG PO CAPS: 25.0000 mg | ORAL_CAPSULE | Freq: Every day | ORAL | Status: DC

## 2016-02-07 MED ORDER — LISINOPRIL 20 MG PO TABS: 20.0000 mg | ORAL_TABLET | Freq: Every day | ORAL | Status: DC

## 2016-02-07 MED ORDER — LISINOPRIL 10 MG PO TABS
20.0000 mg | ORAL_TABLET | Freq: Every day | ORAL | Status: DC
Start: 2016-02-08 — End: 2016-02-07

## 2016-02-07 MED ORDER — OXYCODONE-ACETAMINOPHEN 5-325 MG PO TABS: 1.0000 | ORAL_TABLET | ORAL | Status: DC | PRN

## 2016-02-07 NOTE — Telephone Encounter (Signed)
LM for pt re appt, dpm °

## 2016-02-07 NOTE — Progress Notes (Addendum)
  [facility] Progress Note  Subjective  - POD #6  Complaining of right leg swelling. Ready to go home.   Objective Filed Vitals:   02/06/16 2012 02/07/16 0525  BP: 160/58 172/64  Pulse: 71 78  Temp: 98 F (36.7 C) 97.8 F (36.6 C)  Resp: 18 18    Intake/Output Summary (Last 24 hours) at 02/07/16 0749 Last data filed at 02/07/16 0528  Gross per 24 hour  Intake    360 ml  Output   2200 ml  Net  -1840 ml    Right leg swelling. Right groin and leg incision clean and intact.  Easily palpable right DP and PT pulses.   Assessment/Planning: 65 y.o. female is s/p: Below knee popliteal artery bypass graft with propaten and intraoperative arteriogram with right CFA and profunda endarterectomy with patch angioplasty and exam of the saphenous but not removed. 6 Days Post-Op   Afebrile. Has had leukocytosis since admission. Do not suspect infection. Vanco discontinued yesterday. Bypass is patent.  In NSR. Hypertens Stable for discharge home today.   Alvia Grove 02/07/2016 7:49 AM --  Laboratory CBC    Component Value Date/Time   WBC 17.2* 02/06/2016 0350   HGB 8.9* 02/06/2016 1228   HCT 30.5* 02/06/2016 1228   PLT 365 02/06/2016 0350    BMET    Component Value Date/Time   NA 139 02/06/2016 0350   K 3.5 02/06/2016 0350   CL 106 02/06/2016 0350   CO2 22 02/06/2016 0350   GLUCOSE 101* 02/06/2016 0350   BUN 12 02/06/2016 0350   CREATININE 0.85 02/06/2016 0350   CREATININE 1.02* 07/26/2015 1022   CALCIUM 8.2* 02/06/2016 0350   GFRNONAA >60 02/06/2016 0350   GFRAA >60 02/06/2016 0350    COAG Lab Results  Component Value Date   INR 1.09 01/31/2016   INR 1.25 01/28/2016   INR 1.15 06/25/2015   No results found for: PTT  Antibiotics Anti-infectives    Start     Dose/Rate Route Frequency Ordered Stop   02/04/16 1000  vancomycin (VANCOCIN) IVPB 750 mg/150 ml premix  Status:  Discontinued     750 mg 150 mL/hr over 60 Minutes Intravenous  Every 24 hours 02/03/16 0943 02/06/16 1032   02/03/16 1000  vancomycin (VANCOCIN) 1,250 mg in sodium chloride 0.9 % 250 mL IVPB     1,250 mg 166.7 mL/hr over 90 Minutes Intravenous  Once 02/03/16 0905 02/03/16 1211   02/01/16 2203  vancomycin (VANCOCIN) 1 GM/200ML IVPB    Comments:  Maude Leriche   : cabinet override      02/01/16 2203 02/02/16 1014   01/31/16 1800  vancomycin (VANCOCIN) IVPB 1000 mg/200 mL premix     1,000 mg 200 mL/hr over 60 Minutes Intravenous Every 12 hours 01/31/16 1344 02/01/16 0633   01/31/16 0715  vancomycin (VANCOCIN) IVPB 1000 mg/200 mL premix     1,000 mg 200 mL/hr over 60 Minutes Intravenous To Surgery 01/30/16 0729 01/31/16 0846       Virgina Jock, PA-C [facility] Office: [phone] Pager: [phone] 02/07/2016 7:49 AM  Agreement above assessment Okay to discharge home today 3+ posterior tibial pulse palpable and foot well perfused Encourage patient to elevate leg for the next few weeks. I will see patient in follow-up in 2 weeks in the office

## 2016-02-07 NOTE — Telephone Encounter (Signed)
-----   Message from Mena Goes, RN sent at 02/06/2016 10:42 AM EST ----- Regarding: schedule   ----- Message -----    From: Gabriel Earing, PA-C    Sent: 02/06/2016   9:51 AM      To: Vvs Charge Pool  S/p Right CFA to below knee Popliteal artery bypass graft with propaten and intraoperative arteriogram with right CFA and profunda endarterectomy with patch angioplasty and exam of the saphenous but not removed .  F/u with Dr. Kellie Simmering in 2 weeks.  Thanks, Aldona Bar

## 2016-02-07 NOTE — Discharge Summary (Signed)
Discharge Summary    Patient ID: [name],  MRN: [id number], DOB/AGE: May 16, 1951 65 y.o.  Admit date: 01/28/2016 Discharge date: 02/07/2016  Primary Care Provider: Octavio [name] Primary Cardiologist: [name]  Discharge Diagnoses    Principal Problem:   PAD (peripheral artery disease), Lt carotid 50-70%: , Hx stent Lt exteral iliac & RSFA stent, known occl. LSFA  Active Problems:   Hyperlipidemia   History of CVA (cerebrovascular accident)   PAF (paroxysmal atrial fibrillation), maintaing SR   Chronic anticoagulation, new- eliquis   CAD (coronary artery disease), per cath 04/27/13, non obstructive disease 20-30% LM; LAD 30%; 50-60% OM2; RCA 40%; EF 65-70%   Polycythemia vera (HCC)   Essential hypertension   Allergies Allergies  Allergen Reactions  . Prednisone Other (See Comments)    Muscle spasms  . Statins Itching and Other (See Comments)    Simvastatin-caused severe itching Pravastatin-caused lesser tiching One type of statin? Caused stroke in 2006, and other symptoms as result  . Warfarin And Related Other (See Comments)    Caused nose bleeds  . Lactose Intolerance (Gi) Other (See Comments)    Bloating, gas  . Penicillins Hives and Other (See Comments)    Questionable high fever when taken and broke out in whelps.   . Simvastatin Itching and Rash    Also lipitor intolerant  . Atenolol Rash  . Crestor [Rosuvastatin] Itching and Rash  . Demerol [Meperidine] Other (See Comments)    Unknown, can't remember   . Effexor [Venlafaxine Hcl] Other (See Comments)    Doesn't remember   . Inderal [Propranolol] Other (See Comments)    Doesn't remember   . Lipitor [Atorvastatin] Rash  . Lovaza [Omega-3-Acid Ethyl Esters] Other (See Comments)    Doesn't remember   . Nexium [Esomeprazole Magnesium] Rash  . Percocet [Oxycodone-Acetaminophen] Other (See Comments)    Doesn't remember   . Plavix [Clopidogrel Bisulfate] Rash  . Pravastatin Itching  . 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 Thrombectomy right femoropopliteal bypass graft  2. Endarterectomy below knee popliteal artery and tibial peroneal trunk  3. Bovine pericardial patch angioplasty of below-knee popliteal artery and tibial peroneal trunk   ABI 02/04/2016 ABIs indicate a mild reduction in arterila flow on the right and a mild to moderate reduction on the left. Doppler waveforms are within normal limits bilaterally at rest.  _____________   History of Present Illness     [name] is a 65 y.o. female with a history of PAD, L-ICA 50-70%, Hx stents L-EIA & R-SFA 2007, L-SFA 100% w/ R-SFA restent 06/2015, ABIs L 0.74, R 0.92 01/03/2016. Hx PAF, polycythemia vera, CVA, on Eliquis for anticoagulation.  [name] presents for evaluation of right lower extremity pain.  [name] had been doing very well. She was reasonably active. She was compliant with her medications. She is compliant with appointments for phlebotomy, the most recent one was today,  01/28/2016.  On Sunday morning, she had sudden onset of right lower extremity pain and numbness. The pain is mainly in her calf. She has some pain in her right foot. The numbness is in her right foot, especially on the top. She noted that her right toes were cold.  She could barely walk because of the pain. She was compliant with her medications, but the symptoms did not improve.  She is aware that her blood pressure has been running high, and she has been compliant with her medications. She quit smoking several years ago and confirms that she has not started back.  She is concerned that something has happened to the last stent that Dr [name] put in.   Hospital Course     Consultants: [name] of Vascular surgery  Given suspected stent thrombosis, he was admitted for repeat angiography and potential surgical revascularization. She was taken back to the cath lab on 01/29/2016 for repeat lower extremity angiography and [name] to have occlusion of the previously placed covered stent in the right SFA throughout its course with three-vessel runoff below the knee with evidence of some embolization of distal branches. Right femoral popliteal bypass surgery was recommended. Vascular surgery was consulted, patient was seen by [name] She proceeded with right common femoral to below-knee popliteal artery bypass graft using 6 mm propaten gortex graft, right common femoral and profunda femoris endarterectomy with patchy angioplasty. Eliquis was held for 48 hours after vascular procedure. Unfortunately, post procedure, she lost the signal in the posterior tibial and anterior tibial artery in the afternoon of 02/01/2016. She was taking emergently to the OR by [name] on the night of 2/11 which showed extensive plaque in the below-knee popliteal artery and the proximal tibial vessel, she underwent endarterectomy and bovine pericardial patch angioplasty of below knee popliteal artery and tibial peroneal trunk, thrombectomy of right femoral-popliteal bypass graft.   Her eliquis was restarted on 02/03/2016. She was transferred to telemetry floor on the same day. Post procedure, she did have severe anemia requiring 2 units of packed red blood cell transfusion on 2/14. Vascular  ultrasound obtained on the same day showed triphasic waveform in the right lower extremity, biphasic left PT, otherwise triphasic brachial and DP pulse on the left. She did spike a fever after transfusion. She was started on vancomycin for leukocytosis and fever. Patient was seen by physical therapy who recommended skilled nursing facility placement, however patient feels she can manage at home and the plan to go home on home health service. Patient was seen in the morning of 01/2016/2017 by vascular surgery, who has cleared the patient for discharge from their perspective. She has 3+ posterior tibial pulse, her right foot is well perfused. Vascular surgery has encouraged the patient to elevate the legs for the next few weeks. They will see her in the office in 2 weeks. Her lisinopril has been increased to 20 mg from the previous 5 mg. She will need to have her blood pressure closely monitored. I will also obtain a basic metabolic panel and CBC in one week to monitor her renal function and hemoglobin. She has history of intolerance to statins, will need to consider referral to lipid clinic for PCSK9 inhibitor. As this time time, antibiotic has been discontinued. She is deemed stable for discharge from cardiology perspective as well. I have arranged 1-2 weeks outpatient cardiology follow-up.    _____________  Discharge Vitals Blood pressure 172/64, pulse 78, temperature 97.8 F (36.6 C), temperature source Oral,  resp. rate 18, height 5' 5.5" (1.664 m), weight 150 lb 14.4 oz (68.448 kg), SpO2 97 %.  Filed Weights   02/04/16 0500 02/06/16 0457 02/07/16 0525  Weight: 143 lb 11.8 oz (65.2 kg) 143 lb 11.8 oz (65.2 kg) 150 lb 14.4 oz (68.448 kg)    Labs & Radiologic Studies     CBC  Recent Labs  02/05/16 1141 02/06/16 0350 02/06/16 1228  WBC 20.3* 17.2*  --   HGB 8.4* 7.7* 8.9*  HCT 27.9* 26.7* 30.5*  MCV 69.8* 68.6*  --   PLT 371 365  --    Basic Metabolic Panel  Recent Labs  02/05/16 1141  02/06/16 0350  NA 140 139  K 3.7 3.5  CL 107 106  CO2 19* 22  GLUCOSE 95 101*  BUN 14 12  CREATININE 0.92 0.85  CALCIUM 8.2* 8.2*    Dg Ang/ext/uni/or Right  01/31/2016  CLINICAL DATA:  Right-sided fem-pop bypass grafting EXAM: RIGHT ANG/EXT/UNI/ OR COMPARISON:  None FINDINGS: A single spot intraoperative angiographic image of the right lower leg is provided for review. Intra-arterial contrast has been injected from a more proximal location. There is ossification of the distal aspect of a presumed synthetic bypass graft which anastomoses with the distal aspect of the below-knee popliteal artery. The distal anastomosis appears widely patent. No contrast extravasation. There is smooth moderate narrowing within the perianastomotic segment of the more distal popliteal artery. There is an apparent three-vessel runoff to the imaged lower leg. No discrete intraluminal filling defect to suggest distal embolism. Several surgical clips are noted about the operative site with expected subcutaneous emphysema. No radiopaque foreign body. IMPRESSION: Post right-sided below-knee bypass graft without evidence of complication. Electronically Signed   By: Sandi Mariscal M.D.   On: 01/31/2016 15:36    Disposition   Pt is being discharged home today in good condition.  Follow-up Plans & Appointments    Follow-up Information    Follow up with [name], MD In 2 weeks.   Specialties:  Vascular Surgery, Interventional Cardiology, Cardiology   Why:  Office will call you to arrange your appt (sent)   Contact information:   [address] [place] [address] [phone]       Follow up with Erlene Quan, PA-C On 02/19/2016.   Specialties:  Cardiology, Radiology   Why:  @10 :00AM    Contact information:   [address] [place] [address] [phone]       Follow up with [facility] On 02/12/2016.   Specialty:  Cardiology   Why:  check BMET and CBC lab during normal lab  hour from 8:30 until 4:30PM.    Contact information:   [address] [place] [phone]     Discharge Instructions    Call MD for:  redness, tenderness, or signs of infection (pain, swelling, bleeding, redness, odor or green/yellow discharge around incision site)    Complete by:  As directed      Call MD for:  redness, tenderness, or signs of infection (pain, swelling, bleeding, redness, odor or green/yellow discharge around incision site)    Complete by:  As directed      Call MD for:  severe or increased pain, loss or decreased feeling  in affected limb(s)    Complete by:  As directed      Call MD for:  severe or increased pain, loss or decreased feeling  in affected limb(s)    Complete by:  As directed      Call MD for:  temperature >100.5    Complete by:  As directed      Call MD for:  temperature >100.5    Complete by:  As directed      Discharge wound care:    Complete by:  As directed   Wash the groin wound with soap and water daily and pat dry. (No tub bath-only shower)  Then put a dry gauze or washcloth there to keep this area dry daily and as needed.  Do not use Vaseline or neosporin on your incisions.  Only use soap and water on your incisions and then protect and keep dry.     Discharge wound care:    Complete by:  As directed   Wash the groin wound with soap and water daily and pat dry. (No tub bath-only shower)  Then put a dry gauze or washcloth there to keep this area dry daily and as needed.  Do not use Vaseline or neosporin on your incisions.  Only use soap and water on your incisions and then protect and keep dry.  Wash remaining wounds with soap and water and pat dry.     Driving Restrictions    Complete by:  As directed   No driving for 2 weeks     Driving Restrictions    Complete by:  As directed   No driving for 2 weeks     Increase activity slowly    Complete by:  As directed   Walk with assistance use walker or cane  as needed     Lifting restrictions    Complete by:  As directed   No lifting for 4 weeks     Lifting restrictions    Complete by:  As directed   No lifting for 2 weeks     Resume previous diet    Complete by:  As directed      Resume previous diet    Complete by:  As directed            Discharge Medications   Discharge Medication List as of 02/07/2016  1:34 PM    START taking these medications   Details  oxyCODONE-acetaminophen (PERCOCET/ROXICET) 5-325 MG tablet Take 1-2 tablets by mouth every 4 (four) hours as needed for moderate pain., Starting 02/07/2016, Until Discontinued, Print      CONTINUE these medications which have CHANGED   Details  hydrochlorothiazide (MICROZIDE) 12.5 MG capsule Take 2 capsules (25 mg total) by mouth daily., Starting 02/07/2016, Until Discontinued, Normal    lisinopril (PRINIVIL,ZESTRIL) 20 MG tablet Take 1 tablet (20 mg total) by mouth daily., Starting 02/08/2016, Until Discontinued, Normal    traMADol (ULTRAM) 50 MG tablet Take 1 tablet (50 mg total) by mouth See admin instructions. Currently: taking med between 2-3 times daily for pain, Starting 02/06/2016, Until Discontinued, Print      CONTINUE these medications which have NOT CHANGED   Details  acetaminophen (TYLENOL) 500 MG tablet Take 1,000 mg by mouth every 6 (six) hours as needed for moderate pain. , Until Discontinued, Historical Med    aspirin EC 81 MG EC tablet Take 1 tablet (81 mg total) by mouth daily., Starting 06/28/2015, Until Discontinued, No Print    diltiazem (CARDIZEM CD) 180 MG 24 hr capsule Take 180 mg by mouth every morning., Until Discontinued, Historical Med    docusate sodium (COLACE) 100 MG capsule Take 200 mg by mouth at bedtime. , Until Discontinued, Historical  Med    ELIQUIS 5 MG TABS tablet TAKE ONE TABLET BY MOUTH TWICE A DAY AS DIRECTED, Normal    fenofibrate (TRICOR) 145 MG tablet TAKE ONE TABLET BY MOUTH AT BEDTIME, Normal    flecainide (TAMBOCOR) 50 MG  tablet TAKE ONE TABLET BY MOUTH TWICE DAILY, Normal    omeprazole (PRILOSEC) 20 MG capsule Take 20 mg by mouth 2 (two) times daily before a meal. , Until Discontinued, Historical Med    polyvinyl alcohol (LIQUIFILM TEARS) 1.4 % ophthalmic solution Place 1 drop into both eyes as needed for dry eyes., Until Discontinued, Historical Med    ZETIA 10 MG tablet TAKE ONE TABLET BY MOUTH AT BEDTIME, Normal    pravastatin (PRAVACHOL) 40 MG tablet TAKE 1 TABLET (40 MG TOTAL) BY MOUTH EVERY EVENING., Normal            Outstanding Labs/Studies   BMET and CBC next week  Duration of Discharge Encounter   Greater than 30 minutes including physician time.  Signed, Almyra Deforest PA-C 02/07/2016, 1:52 PM

## 2016-02-07 NOTE — Progress Notes (Signed)
PATIENT ID: [id number] with polycythemia vera, PAD, hypertension, and h and PAF on Eliquis here with stenting of the R SFA complicated by stent thrombosis.  She subsequently underwent R below the knee popliteal artery bypass that was complicated by embolization.  Now s/p successful embolectomy on 02/01/16.  SUBJECTIVE:  [name] is feeling well this AM.  She denies chest pain or shortness of breath.  She has some discomfort from the incision sites and feels like her leg is tight.  She is otherwise without complaint and ready to go home.   PHYSICAL EXAM Filed Vitals:   02/06/16 0923 02/06/16 1327 02/06/16 2012 02/07/16 0525  BP: 163/59 154/55 160/58 172/64  Pulse: 89 81 71 78  Temp:  97.6 F (36.4 C) 98 F (36.7 C) 97.8 F (36.6 C)  TempSrc:  Oral Oral Oral  Resp:  18 18 18   Height:      Weight:    68.448 kg (150 lb 14.4 oz)  SpO2: 94% 95% 99% 97%   General:  Well-appearing. NAD Neck: JVP to mid neck at 45 degrees Lungs:  CTAB.  No crackles, rhonchi or wheezes Heart:  RRR.  No m/r/g Abdomen:  Soft, NT, ND.  +BS3 Extremities:  1+ pitting edema on the R.  Incisions without erythema or drainage.  LABS: Lab Results  Component Value Date   TROPONINI 0.92* 04/27/2013   Results for orders placed or performed during the hospital encounter of 01/28/16 (from the past 24 hour(s))  Hemoglobin and hematocrit, blood     Status: Abnormal   Collection Time: 02/06/16 12:28 PM  Result Value Ref Range   Hemoglobin 8.9 (L) 12.0 - 15.0 g/dL   HCT 30.5 (L) 36.0 - 46.0 %    Intake/Output Summary (Last 24 hours) at 02/07/16 1143 Last data filed at 02/07/16 0900  Gross per 24 hour  Intake    720 ml  Output   2900 ml  Net  -2180 ml     ASSESSMENT AND PLAN:  Active Problems:   PAD (peripheral artery disease), Lt carotid 50-70%: , Hx stent Lt exteral iliac & RSFA stent, known occl. LSFA    Hyperlipidemia   History of CVA (cerebrovascular accident)   PAF (paroxysmal atrial fibrillation),  maintaing SR   Chronic anticoagulation, new- eliquis   CAD (coronary artery disease), per cath 04/27/13, non obstructive disease 20-30% LM; LAD 30%; 50-60% OM2; RCA 40%; EF 65-70%   Polycythemia vera (HCC)   Essential hypertension   PVD (peripheral vascular disease) ([place])   # PAD: [name] is improving.  She now has palpable DP/PT pulses bilaterally.  Home today per Vascular Surgery.  # PAF: Remains in sinus rhythm.  She has one short run of afib that was asymptomatic this AM.  Continue flecainde, cardizem and Eliquis.  H/H stable after transfusion.  Saline nasal spray for mild epistaxis.  # Hypertension: BP remains elevated.  HCTZ was increased to 25 yesterday.  We will increase her lisinopril to 20 mg from 5 mg.  She will need a BP check and  BMP in 1-2 weeks.   # Hyperlipidemia:  Cannot tolerate statins.  We can consider referring her to our lipid clinic for PCSK-9 therapy as an outpatient.   # Leukocytosis: WBC is at baseline.  Chronically elevated.  She did have a low-grade fever to 101.3.  Empiric antibiotics discontinued.  Time spent: 35 minutes-Greater than 50% of this time was spent in counseling, explanation of diagnosis, planning of further management, and coordination  of care.    Anthea Udovich C. Oval Linsey, MD, [facility] 02/07/2016 11:43 AM

## 2016-02-07 NOTE — Care Management Important Message (Signed)
Important Message  Patient Details  Name: [name] MRN: [id number] Date of Birth: 03/23/1951   Medicare Important Message Given:  Yes    Nathen May 02/07/2016, 12:04 PM

## 2016-02-07 NOTE — Progress Notes (Signed)
Occupational Therapy Treatment and Discharge Patient Details Name: [name] MRN: [id number] DOB: Oct 10, 1951 Today's Date: 02/07/2016    History of present illness Adm 01/28/16 with RLE claudication/pain. 2/8 angiogram with multiple blockages; 2/10 Rt fem-pop BPG.  On 2/12 pt underwent thrombectomy Rt femoropopliteal bypass graft, endarterectomy below knee popliteal arter, and patch angioplasty of below knee popliteal artery.  PMHx- PVD (previous LE stents), afib on anticoagulation, CVA, MI, Lt knee surgery.   OT comments  Pt performing ADL at a modified independent level in this setting.  Agree with plan to discharge home.  Follow Up Recommendations  Home health OT    Equipment Recommendations       Recommendations for Other Services      Precautions / Restrictions         Mobility Bed Mobility Overal bed mobility: Modified Independent                Transfers   Equipment used: Rolling walker (2 wheeled) Transfers: Sit to/from Stand Sit to Stand: Modified independent (Device/Increase time)         General transfer comment: good hand placement     Balance             Standing balance-Leahy Scale: Fair                     ADL Overall ADL's : Modified independent     Grooming: Oral care;Standing;Modified independent               Lower Body Dressing: Modified independent;Sit to/from stand Lower Body Dressing Details (indicate cue type and reason): issued sock aid and instructed in use, pt able to donn panties independently               General ADL Comments: issued and instructed in use of walker bag, pt able to safely gather ADL items from around room with walker, pt agreeable to someone being in the home when she showers, borrowing friend's shower seat and apartment manager is installing a hand held shower head today      [name]   Behavior During Therapy:  [facility] for tasks assessed/performed Overall Cognitive Status: Within Functional Limits for tasks assessed                       Extremity/Trunk Assessment               Exercises     Shoulder Instructions       General Comments      Pertinent Vitals/ Pain       Pain Assessment: Faces Faces Pain Scale: Hurts a little bit Pain Location: R LE Pain Descriptors / Indicators: Sore Pain Intervention(s): Repositioned;Premedicated before session  Home Living                                          Prior Functioning/Environment              Frequency Min 2X/week     Progress Toward Goals  OT Goals(current goals can now be found in the care plan section)  Progress towards OT goals: Goals met/education completed, patient discharged from OT  Acute  Rehab OT Goals Patient Stated Goal: home today  Plan Discharge plan needs to be updated    Co-evaluation                 End of Session Equipment Utilized During Treatment: Rolling walker;Gait belt   Activity Tolerance Patient tolerated treatment well   Patient Left in chair;with call bell/phone within reach   Nurse Communication          Time: 6122-4497 OT Time Calculation (min): 45 min  Charges: OT General Charges $OT Visit: 1 Procedure OT Treatments $Self Care/Home Management : 38-52 mins  Malka So 02/07/2016, 11:07 AM  [phone]

## 2016-02-07 NOTE — Progress Notes (Signed)
Discharge instructions reviewed with pt and family.  They express understanding of medication regimen, follow up appointments and healthcare information.  IV  And tele removed. Pt discharged off unit via w/c by volunteer services.

## 2016-02-07 NOTE — Care Management Important Message (Signed)
Important Message  Patient Details  Name: [name] MRN: [id number] Date of Birth: Mar 22, 1951   Medicare Important Message Given:  Yes    Nathen May 02/07/2016, 2:04 PM

## 2016-02-08 LAB — TYPE AND SCREEN
ABO/RH(D): A POS
Antibody Screen: NEGATIVE
UNIT DIVISION: 0
UNIT DIVISION: 0
Unit division: 0
Unit division: 0

## 2016-02-11 ENCOUNTER — Telehealth: Payer: Self-pay | Admitting: Cardiovascular Disease

## 2016-02-11 ENCOUNTER — Encounter: Payer: Self-pay | Admitting: *Deleted

## 2016-02-11 ENCOUNTER — Other Ambulatory Visit: Payer: Self-pay | Admitting: *Deleted

## 2016-02-11 ENCOUNTER — Telehealth: Payer: Self-pay | Admitting: *Deleted

## 2016-02-11 DIAGNOSIS — Z789 Other specified health status: Secondary | ICD-10-CM

## 2016-02-11 DIAGNOSIS — D62 Acute posthemorrhagic anemia: Secondary | ICD-10-CM

## 2016-02-11 NOTE — Telephone Encounter (Signed)
-----   Message from [place], [place] sent at 02/07/2016  6:09 PM EST ----- Regarding: outpatient labs Patient need outpatient CBC and BMET on 02/12/2016, (CBC for anemia and leukocytosis, BMET for to monitor renal function and K after increasing lisinopril) however she lives in [place] and prefers to obtain the labs at [facility] in [place], is there anyone for [place] to fax an order for CBC and BMET to [employer] [facility]?  Hilbert Corrigan PA Pager: [phone]

## 2016-02-11 NOTE — Telephone Encounter (Signed)
Lab orders released for [facility], patient aware.

## 2016-02-11 NOTE — Telephone Encounter (Signed)
Called pt and let her know that the lab orders have been placed for her to go to [facility] for her labs. Pt verbalized understanding.

## 2016-02-11 NOTE — Telephone Encounter (Signed)
New Message  Pt calling to speak w/ RN concerning doing lab work in [place]- said she was told to have them done before tomorrow 2/22. Please call back and discuss.

## 2016-02-12 LAB — CBC
HEMATOCRIT: 30.7 % — AB (ref 36.0–46.0)
Hemoglobin: 9.2 g/dL — ABNORMAL LOW (ref 12.0–15.0)
MCH: 20.4 pg — ABNORMAL LOW (ref 26.0–34.0)
MCHC: 30 g/dL (ref 30.0–36.0)
MCV: 67.9 fL — ABNORMAL LOW (ref 78.0–100.0)
PLATELETS: 576 10*3/uL — AB (ref 150–400)
RBC: 4.52 MIL/uL (ref 3.87–5.11)
RDW: 22.8 % — AB (ref 11.5–15.5)
WBC: 19.5 10*3/uL — AB (ref 4.0–10.5)

## 2016-02-17 NOTE — Progress Notes (Signed)
Cardiology Office Note:    Date:  02/18/2016   ID:  [name], DOB Nov 25, 1951, MRN [id number]  PCP:  Octavio Graves, DO  Cardiologist:  Dr. Quay Burow   Electrophysiologist:  n/a  Chief Complaint  Patient presents with  . Hospitalization Follow-up    s/p R Fem-Pop Bypass    History of Present Illness:     [name] is a 65 y.o. female with a hx of carotid stenosis, PAD status post left external iliac artery PTA and stenting, right SFA stenting, known occluded left SFA, prior tobacco abuse, HTN, HL (statin intolerant), PAF controlled with flecainide (warfarin discontinued secondary to epistaxis) on Eliquis for anticoagulation, mild nonobstructive CAD by catheterization in 2014. She developed recurrent claudication and underwent repeat angiography in 7/16 with Dr. [name]. This demonstrated an occluded right SFA. This was treated with repeat stenting. Last seen by Dr. [name] 12/2015.   She was seen urgently on 01/28/16 with acute onset of right lower extremity pain. This was felt to represent an acute ischemic event. She was admitted to the hospital for further evaluation.  Angiography on 01/29/16 demonstrated occlusion of the previously placed stent in the right SFA. She was seen by Dr. Kellie Simmering for vascular surgery and underwent right common femoral to below-knee popliteal bypass graft 01/31/16. Post procedure, she lost signal in the posterior tibial and intertibial artery and went back to the OR emergently on 02/02/16.  She underwent endarterectomy and bovine pericardial patch angioplasty of below-knee popliteal artery and tibial peroneal trunk, thrombectomy of right femoral popliteal bypass graft. Postoperative course was complicated by blood loss anemia requiring transfusion with 2 units PRBCs. She was evaluated by PT. SNF was recommended. However, the patient declined and decided to go home with home health. Vascular surgery planned to see her 2 weeks post discharge.  Returns for FU.  Here  alone. She is doing well since discharge. Her right leg is still painful. She denies any discoloration or acute pain. She still has some swelling. She denies chest pain, shortness breath, orthopnea, PND. Denies syncope. Denies palpitations. Denies any bleeding.   Past Medical History  Diagnosis Date  . Hypertension   . PVD (peripheral vascular disease) with claudication ([place]) 07/2006    previous L ext iliac stent and rt SFA stent, known occluded Lt SFA  . Paroxysmal atrial fibrillation (HCC)     hx of a fib on ASA  AND ELIQUIS   . H/O cardiovascular stress test 12/27/2009    negative for ischemia  . NSTEMI (non-ST elevated myocardial infarction) ([place]) 04/27/2013  . S/P cardiac cath 04/27/13    NON OBSTRUCTIVE DISEASE, 2+ mr, ef 65-70%  . CAD (coronary artery disease), per cath 04/27/13, non obstructive disease, EF 65-70% 05/11/2013  . Mitral regurgitation,2+ by cath 05/11/2013  . Statin intolerance   . Carotid disease, bilateral ([place]) 09/2013    Lt carotid 50-69% stentosis, less on rt  . Hyperlipidemia   . Dysrhythmia   . GERD (gastroesophageal reflux disease)   . Pain     BOTH KNEES - PT HAS TORN MENISCUS LEFT KNEE  . Glaucoma   . Fatty liver   . Leukocytosis 07/15/2015  . Polycythemia vera ([place]) 08/20/2015    JAK-2 positive 07/19/15  . Headache     "occasional since eye pressure regulated" (01/28/2016)  . Migraine     "stopped w/laser holes to relieve pressure in my eyes; had them 3-4 times/wk before taht" (01/28/2016)  . Stroke [facility]) 2006  SWELLING ALL OVER AND RT SIDE OF FACE DRAWN AND SPEECH SLURRED AND NUMBNESS ON RIGHT SIDE-- ALL RESOLVED  . Arthritis     "fingers, some in my knees" (01/28/2016)    Past Surgical History  Procedure Laterality Date  . Ovarian cyst removal Right   . Iliac artery stent  07/2006    external iliac and Rt SFA stent 07/2006 AND THE LEFT WAS IN 2006  . Cesarean section  1977  . Orif wrist fracture Right 1983  . Knee arthroscopy with medial menisectomy Left  03/27/2014    Procedure: left knee arthorscopy with medial chondraplasty of the medial femoral and patella, medial microfracture technique of medial femoral condyl;  Surgeon: Tobi Bastos, MD;  Location: [facility]S;  Service: Orthopedics;  Laterality: Left;  . Left heart catheterization with coronary angiogram Right 04/27/2013    Procedure: LEFT HEART CATHETERIZATION WITH CORONARY ANGIOGRAM;  Surgeon: Leonie Man, MD;  Location: [facility] CATH LAB;  Service: Cardiovascular;  Laterality: Right;  . Tubal ligation  1983  . Cataract extraction w/phaco Right 03/21/2015    Procedure: CATARACT EXTRACTION PHACO AND INTRAOCULAR LENS PLACEMENT RIGHT EYE;  Surgeon: Baruch Goldmann, MD;  Location: [facility] ORS;  Service: Ophthalmology;  Laterality: Right;  CDE:5.10  . Cataract extraction w/phaco Left 05/16/2015    Procedure: CATARACT EXTRACTION PHACO AND INTRAOCULAR LENS PLACEMENT (IOC);  Surgeon: Baruch Goldmann, MD;  Location: [facility] ORS;  Service: Ophthalmology;  Laterality: Left;  CDE 5.57  . Sfa Right 06/27/2015    overlapping Bahn covered stents  . Peripheral vascular catheterization N/A 06/27/2015    Procedure: Lower Extremity Angiography;  Surgeon: Lorretta Harp, MD;  Location: [place] CV LAB;  Service: Cardiovascular;  Laterality: N/A;  . Fracture surgery    . Cardiac catheterization  04/27/13    non occlusive disease with mild to mod. calcified lesions in ostial LM and proximal LAD and moderate prox. RC AND DISTAL AV GROOVE LCX, ef  . Refractive surgery Bilateral     "6 in one eye; 7 in the other; to relieve pressure; not glaucoma" (01/28/2016)  . Peripheral vascular catheterization Bilateral 01/29/2016    Procedure: Lower Extremity Angiography;  Surgeon: Wellington Hampshire, MD;  Location: [place] CV LAB;  Service: Cardiovascular;  Laterality: Bilateral;  . Peripheral vascular catheterization N/A 01/29/2016    Procedure: Abdominal Aortogram;  Surgeon: Wellington Hampshire, MD;  Location: [facility] CV LAB;  Service:  Cardiovascular;  Laterality: N/A;  . Femoral-popliteal bypass graft Right 01/31/2016    Procedure: BYPASS GRAFT RIGHT COMMON  FEMORALTO BELOW KNEE POPLITEAL ARTERY BYPASS GRAFT USING 6MM PROPATEN GORTEX GRAFT;  Surgeon: Mal Misty, MD;  Location: [place];  Service: Vascular;  Laterality: Right;  . Lower extremity angiogram Right 01/31/2016    Procedure: INTRAOP RIGHT LOWER EXTREMITY ANGIOGRAM;  Surgeon: Mal Misty, MD;  Location: [place];  Service: Vascular;  Laterality: Right;  . Patch angioplasty Right 01/31/2016    Procedure: RIGHT COMMON FEMORAL AND PROFUNDA FEMORIS ENDARECTOMY WITH PATCH ANGIOPLASTY;  Surgeon: Mal Misty, MD;  Location: [place];  Service: Vascular;  Laterality: Right;  . Vein repair Right 01/31/2016    Procedure: RIGHT GREATER SAPHENOUS VEIN EXAMNED BUT NOT REMOVED;  Surgeon: Mal Misty, MD;  Location: [place];  Service: Vascular;  Laterality: Right;  . Embolectomy Right 02/01/2016    Procedure: Thrombectomy of Right Femoral-Popliteal Bypass Graft; Endarterectomy of Right Below Knee Popliteal Artery and Tibial Peroneal Trunk with Bovine Pericardium Patch Angioplasty.;  Surgeon: Angelia Mould, MD;  Location: [facility] OR;  Service: Vascular;  Laterality: Right;    Current Medications: Outpatient Prescriptions Prior to Visit  Medication Sig Dispense Refill  . acetaminophen (TYLENOL) 500 MG tablet Take 1,000 mg by mouth every 6 (six) hours as needed for moderate pain.     Marland Kitchen aspirin EC 81 MG EC tablet Take 1 tablet (81 mg total) by mouth daily.    Marland Kitchen diltiazem (CARDIZEM CD) 180 MG 24 hr capsule Take 180 mg by mouth every morning.    . docusate sodium (COLACE) 100 MG capsule Take 200 mg by mouth daily as needed for mild constipation (FOR CONSTIPATION).     Marland Kitchen ELIQUIS 5 MG TABS tablet TAKE ONE TABLET BY MOUTH TWICE A DAY AS DIRECTED 60 tablet 4  . fenofibrate (TRICOR) 145 MG tablet TAKE ONE TABLET BY MOUTH AT BEDTIME 30 tablet 11  . flecainide (TAMBOCOR) 50 MG tablet TAKE ONE  TABLET BY MOUTH TWICE DAILY 60 tablet 11  . omeprazole (PRILOSEC) 20 MG capsule Take 20 mg by mouth 2 (two) times daily before a meal.     . oxyCODONE-acetaminophen (PERCOCET/ROXICET) 5-325 MG tablet Take 1-2 tablets by mouth every 4 (four) hours as needed for moderate pain. 30 tablet 0  . polyvinyl alcohol (LIQUIFILM TEARS) 1.4 % ophthalmic solution Place 1 drop into both eyes as needed for dry eyes.    . pravastatin (PRAVACHOL) 40 MG tablet TAKE 1 TABLET (40 MG TOTAL) BY MOUTH EVERY EVENING. 30 tablet 10  . traMADol (ULTRAM) 50 MG tablet Take 1 tablet (50 mg total) by mouth See admin instructions. Currently: taking med between 2-3 times daily for pain 30 tablet 0  . ZETIA 10 MG tablet TAKE ONE TABLET BY MOUTH AT BEDTIME 30 tablet 11  . hydrochlorothiazide (MICROZIDE) 12.5 MG capsule Take 2 capsules (25 mg total) by mouth daily. 30 capsule 11  . lisinopril (PRINIVIL,ZESTRIL) 20 MG tablet Take 1 tablet (20 mg total) by mouth daily. 30 tablet 1   No facility-administered medications prior to visit.     Allergies:   Prednisone; Statins; Warfarin and related; Lactose intolerance (gi); Penicillins; Simvastatin; Atenolol; Crestor; Demerol; Effexor; Inderal; Lipitor; Lovaza; Nexium; Percocet; Plavix; Pravastatin; and Trilipix   Social History   Social History  . Marital Status: Widowed    Spouse Name: N/A  . Number of Children: 2  . Years of Education: 12   Occupational History  . CNA    Social History Main Topics  . Smoking status: Former Smoker -- 1.00 packs/day for 45 years    Types: Cigarettes    Quit date: 11/11/2012  . Smokeless tobacco: Never Used  . Alcohol Use: No  . Drug Use: No  . Sexual Activity: Not Currently    Birth Control/ Protection: None   Other Topics Concern  . None   Social History Narrative     Family History:  The patient's family history includes CAD in her brother, brother, father, and mother; Healthy in her sister and sister; Heart attack in her mother;  Heart attack (age of onset: 23) in her father; Heart disease in her father; Liver cancer in her brother; Lung cancer in her mother.   ROS:   Please see the history of present illness.    ROS All other systems reviewed and are negative.   Physical Exam:    VS:  BP 140/58 mmHg  Pulse 76  Ht 5' 5.5" (1.664 m)  Wt 143 lb 3.2 oz (64.955 kg)  BMI 23.46 kg/m2   GEN: Well nourished, well developed, in no acute distress HEENT: normal Neck: no JVD, no masses Cardiac: Normal S1/S2, RRR; no murmurs, tight 1+ RLE edema;    Respiratory:  clear to auscultation bilaterally; no wheezing, rhonchi or rales GI: soft, nontender  MS: no deformity or atrophy Skin: warm and dry  Neuro:    no focal deficits  Psych: Alert and oriented x 3, normal affect  Wt Readings from Last 3 Encounters:  02/18/16 143 lb 3.2 oz (64.955 kg)  02/07/16 150 lb 14.4 oz (68.448 kg)  01/28/16 146 lb (66.225 kg)      Studies/Labs Reviewed:     EKG:  EKG is not ordered today.  The ekg ordered today demonstrates n/a  Recent Labs: 06/25/2015: TSH 2.113 01/29/2016: ALT 13* 02/06/2016: BUN 12; Creatinine, Ser 0.85; Potassium 3.5; Sodium 139 02/11/2016: Hemoglobin 9.2*; Platelets 576*   Recent Lipid Panel    Component Value Date/Time   CHOL 156 06/25/2015 0950   TRIG 361* 06/25/2015 0950   HDL 36* 06/25/2015 0950   CHOLHDL 4.3 06/25/2015 0950   VLDL 72* 06/25/2015 0950   LDLCALC 48 06/25/2015 0950    Additional studies/ records that were reviewed today include:   PV Study 02/04/16 ABIs: R mild reduction in arterial flow.  L mild to moderate reduction in arterial flow. Doppler waveforms are within normal limits bilaterally at rest.  LE Angiogram 01/29/16 1. Patent left iliac stents with chronically occluded left SFA with good collaterals from the profunda and three-vessel runoff below the knee. 2. Moderate right external iliac artery stenosis. Occlusion of the previously placed covered stents in the right SFA  throughout its course with three-vessel runoff below the knee with evidence of some embolization into the distal branches. Recommendations: The patient had acute limb ischemia involving the right lower extremity likely due to thrombosis to previously placed covered stents in July. Recommend right femoropopliteal bypass. I discussed the case with Dr. [name] and consulted Dr. Kellie Simmering. Resume heparin 8 hours after sheath pull.  Carotid US 8/16 1-39% RICA stenosis. [id number] LICA stenosis. f/u 6 months  [facility] 5/14 EF 65-70% LM ostial 20-30% LAD proximal 30% LCx OM 2 distal been 50-60% RCA 40%  Echo 5/14  mild LVH, EF 55-65%, normal wall motion, mild MR  Myoview 1/11 Normal perfusion, no ischemia, EF 74%; Low Risk   ASSESSMENT:     1. PAD (peripheral artery disease) ([place])   2. Anemia, unspecified anemia type   3. Essential hypertension   4. Hyperlipidemia   5. PAF (paroxysmal atrial fibrillation), maintaing SR   6. Coronary artery disease due to lipid rich plaque   7. Carotid stenosis, bilateral     PLAN:     In order of problems listed above:  1. PAD - s/p L EIA stenting and R SFA stenting x 2.  Now, she is s/p R fem-pop bypass by Dr. Kellie Simmering.  She has FU later today.  2. Anemia - Acute blood loss anemia in setting of LE revascularization. She will have a CBC repeated today.  3. HTN - Meds adjusted during hospital stay post op.  Her ACE inhibitor was increased. Repeat BMET today.  BP is borderline but she remains in pain.  Continue to monitor for now.   4. HL - Continue statin.  5. PAF - Maintaining NSR by exam.  She has been controlled with Flecainide per Dr. Quay Burow.  Continue Eliquis.  6. CAD - No angina.  Continue ASA, statin.  7. Carotid stenosis - In August, she will have a repeat carotid US.     Medication Adjustments/Labs and Tests Ordered: Current medicines are reviewed at length with the patient today.  Concerns regarding medicines are outlined above.   Medication changes, Labs and Tests ordered today are outlined in the Patient Instructions noted below. Patient Instructions  Medication Instructions:  Your physician recommends that you continue on your current medications as directed. Please refer to the Current Medication list given to you today.  Labwork: TODAY BMET, CBC W/DIFF  Testing/Procedures: NONE  Follow-Up: AS PLANNED WITH DR. [name]  Any Other Special Instructions Will Be Listed Below (If Applicable).  If you need a refill on your cardiac medications before your next appointment, please call your pharmacy.   Signed, Richardson Dopp, PA-C  02/18/2016 1:40 PM    [facility] [place], [place], [place]  [address] Phone: [phone]; Fax: [phone]

## 2016-02-18 ENCOUNTER — Ambulatory Visit (INDEPENDENT_AMBULATORY_CARE_PROVIDER_SITE_OTHER): Payer: Medicare Other | Admitting: Physician Assistant

## 2016-02-18 ENCOUNTER — Encounter: Payer: Self-pay | Admitting: Physician Assistant

## 2016-02-18 ENCOUNTER — Encounter: Payer: Self-pay | Admitting: Vascular Surgery

## 2016-02-18 ENCOUNTER — Ambulatory Visit (INDEPENDENT_AMBULATORY_CARE_PROVIDER_SITE_OTHER): Payer: Self-pay | Admitting: Vascular Surgery

## 2016-02-18 VITALS — BP 142/76 | HR 73 | Temp 97.1°F | Resp 16 | Ht 65.5 in | Wt 146.3 lb

## 2016-02-18 VITALS — BP 140/58 | HR 76 | Ht 65.5 in | Wt 143.2 lb

## 2016-02-18 DIAGNOSIS — I2510 Atherosclerotic heart disease of native coronary artery without angina pectoris: Secondary | ICD-10-CM

## 2016-02-18 DIAGNOSIS — I10 Essential (primary) hypertension: Secondary | ICD-10-CM | POA: Diagnosis not present

## 2016-02-18 DIAGNOSIS — E785 Hyperlipidemia, unspecified: Secondary | ICD-10-CM

## 2016-02-18 DIAGNOSIS — I739 Peripheral vascular disease, unspecified: Secondary | ICD-10-CM

## 2016-02-18 DIAGNOSIS — D649 Anemia, unspecified: Secondary | ICD-10-CM

## 2016-02-18 DIAGNOSIS — I480 Paroxysmal atrial fibrillation: Secondary | ICD-10-CM

## 2016-02-18 DIAGNOSIS — I2583 Coronary atherosclerosis due to lipid rich plaque: Secondary | ICD-10-CM

## 2016-02-18 DIAGNOSIS — I6523 Occlusion and stenosis of bilateral carotid arteries: Secondary | ICD-10-CM

## 2016-02-18 LAB — BASIC METABOLIC PANEL
BUN: 27 mg/dL — ABNORMAL HIGH (ref 7–25)
CO2: 25 mmol/L (ref 20–31)
Calcium: 8.9 mg/dL (ref 8.6–10.4)
Chloride: 99 mmol/L (ref 98–110)
Creat: 1.19 mg/dL — ABNORMAL HIGH (ref 0.50–0.99)
Glucose, Bld: 66 mg/dL (ref 65–99)
POTASSIUM: 5.1 mmol/L (ref 3.5–5.3)
SODIUM: 136 mmol/L (ref 135–146)

## 2016-02-18 NOTE — Addendum Note (Signed)
Addended by: Dorthula Rue L on: 02/18/2016 04:31 PM   Modules accepted: Orders

## 2016-02-18 NOTE — Progress Notes (Signed)
Subjective:     Patient ID: [name], female   DOB: Aug 13, 1951, 65 y.o.   MRN: [id number]  HPI this 65 year old female returns for initial follow-up regarding a right femoral-popliteal bypass graft performed 02/01/2016 with 6 mm Gore-Tex because the  Saphenous vein was very small. It was not removed however. The patient had thrombosis of the graft 36 hours later requiring a return trip to the operating room by Dr. Gae Gallop   Who performed endarterectomy and patch angioplasty of the tibioperoneal trunk. Graft has remained patent since that time. she has had some edema in the right leg. She is ambulating with help and having physical therapy at home 2 days per week. She's had no chills and fever.   Review of Systems     Objective:   Physical Exam BP 142/76 mmHg  Pulse 73  Temp(Src) 97.1 F (36.2 C) (Oral)  Resp 16  Ht 5' 5.5" (1.664 m)  Wt 146 lb 4.8 oz (66.361 kg)  BMI 23.97 kg/m2  SpO2 97%   a well-developed well-nourished female in no apparent distress alert and oriented 3  lungs no rhonchi or wheezing Right lower extremity with satisfactory healing of leg wounds. There is some eschar in the right inguinal wound with minimal erythema surrounding this but no fluctuance or drainage. There is excellent Doppler flow in the dorsalis pedis and posterior tibial with the posterior tibial being the best of the 2. Right foot is pink and well perfused.  1+ edema from knee to foot.     Assessment:      nicely functioning right femoral popliteal Gore-Tex graft which required return to OR for thrombectomy and revision of tibioperoneal trunk by Dr. Gae Gallop.     Plan:      return in 3 months with duplex scan of right femoral popliteal graft and repeat ABIs  Given Keflex 500 mg 1 by mouth 3 times a day for 10 days Given prescription for oxycodone/acetaminophen 5 /325 #24 one every 4-6 hours when necessary for pain    [name] next visit will then return patient to be followed by Dr.  Shanon Rosser since he did second procedure.

## 2016-02-18 NOTE — Progress Notes (Signed)
Filed Vitals:   02/18/16 1427 02/18/16 1433  BP: 163/81 142/76  Pulse: 77 73  Temp: 97.1 F (36.2 C)   TempSrc: Oral   Resp: 16   Height: 5' 5.5" (1.664 m)   Weight: 146 lb 4.8 oz (66.361 kg)   SpO2: 97%

## 2016-02-18 NOTE — Patient Instructions (Addendum)
Medication Instructions:  Your physician recommends that you continue on your current medications as directed. Please refer to the Current Medication list given to you today.  Labwork: TODAY BMET, CBC W/DIFF  Testing/Procedures: NONE  Follow-Up: AS PLANNED WITH DR. [name]  Any Other Special Instructions Will Be Listed Below (If Applicable).  If you need a refill on your cardiac medications before your next appointment, please call your pharmacy.    Fat and Cholesterol Restricted Diet High levels of fat and cholesterol in your blood may lead to various health problems, such as diseases of the heart, blood vessels, gallbladder, liver, and pancreas. Fats are concentrated sources of energy that come in various forms. Certain types of fat, including saturated fat, may be harmful in excess. Cholesterol is a substance needed by your body in small amounts. Your body makes all the cholesterol it needs. Excess cholesterol comes from the food you eat. When you have high levels of cholesterol and saturated fat in your blood, health problems can develop because the excess fat and cholesterol will gather along the walls of your blood vessels, causing them to narrow. Choosing the right foods will help you control your intake of fat and cholesterol. This will help keep the levels of these substances in your blood within normal limits and reduce your risk of disease. WHAT IS MY PLAN? Your health care provider recommends that you:  Get no more than __________ % of the total calories in your daily diet from fat.  Limit your intake of saturated fat to less than ______% of your total calories each day.  Limit the amount of cholesterol in your diet to less than _________mg per day. WHAT TYPES OF FAT SHOULD I CHOOSE?  Choose healthy fats more often. Choose monounsaturated and polyunsaturated fats, such as olive and canola oil, flaxseeds, walnuts, almonds, and seeds.  Eat more omega-3 fats. Good choices  include salmon, mackerel, sardines, tuna, flaxseed oil, and ground flaxseeds. Aim to eat fish at least two times a week.  Limit saturated fats. Saturated fats are primarily found in animal products, such as meats, butter, and cream. Plant sources of saturated fats include palm oil, palm kernel oil, and coconut oil.  Avoid foods with partially hydrogenated oils in them. These contain trans fats. Examples of foods that contain trans fats are stick margarine, some tub margarines, cookies, crackers, and other baked goods. WHAT GENERAL GUIDELINES DO I NEED TO FOLLOW? These guidelines for healthy eating will help you control your intake of fat and cholesterol:  Check food labels carefully to identify foods with trans fats or high amounts of saturated fat.  Fill one half of your plate with vegetables and green salads.  Fill one fourth of your plate with whole grains. Look for the word "whole" as the first word in the ingredient list.  Fill one fourth of your plate with lean protein foods.  Limit fruit to two servings a day. Choose fruit instead of juice.  Eat more foods that contain soluble fiber. Examples of foods that contain this type of fiber are apples, broccoli, carrots, beans, peas, and barley. Aim to get 20-30 g of fiber per day.  Eat more home-cooked food and less restaurant, buffet, and fast food.  Limit or avoid alcohol.  Limit foods high in starch and sugar.  Limit fried foods.  Cook foods using methods other than frying. Baking, boiling, grilling, and broiling are all great options.  Lose weight if you are overweight. Losing just 5-10% of your initial  body weight can help your overall health and prevent diseases such as diabetes and heart disease. WHAT FOODS CAN I EAT? Grains Whole grains, such as whole wheat or whole grain breads, crackers, cereals, and pasta. Unsweetened oatmeal, bulgur, barley, quinoa, or brown rice. Corn or whole wheat flour tortillas. Vegetables Fresh or  frozen vegetables (raw, steamed, roasted, or grilled). Green salads. Fruits All fresh, canned (in natural juice), or frozen fruits. Meat and Other Protein Products Ground beef (85% or leaner), grass-fed beef, or beef trimmed of fat. Skinless chicken or [place]. Ground chicken or [place]. Pork trimmed of fat. All fish and seafood. Eggs. Dried beans, peas, or lentils. Unsalted nuts or seeds. Unsalted canned or dry beans. Dairy Low-fat dairy products, such as skim or 1% milk, 2% or reduced-fat cheeses, low-fat ricotta or cottage cheese, or plain low-fat yogurt. Fats and Oils Tub margarines without trans fats. Light or reduced-fat mayonnaise and salad dressings. Avocado. Olive, canola, sesame, or safflower oils. Natural peanut or almond butter (choose ones without added sugar and oil). The items listed above may not be a complete list of recommended foods or beverages. Contact your dietitian for more options. WHAT FOODS ARE NOT RECOMMENDED? Grains White bread. White pasta. White rice. Cornbread. Bagels, pastries, and croissants. Crackers that contain trans fat. Vegetables White potatoes. Corn. Creamed or fried vegetables. Vegetables in a cheese sauce. Fruits Dried fruits. Canned fruit in light or heavy syrup. Fruit juice. Meat and Other Protein Products Fatty cuts of meat. Ribs, chicken wings, bacon, sausage, bologna, salami, chitterlings, fatback, hot dogs, bratwurst, and packaged luncheon meats. Liver and organ meats. Dairy Whole or 2% milk, cream, half-and-half, and cream cheese. Whole milk cheeses. Whole-fat or sweetened yogurt. Full-fat cheeses. Nondairy creamers and whipped toppings. Processed cheese, cheese spreads, or cheese curds. Sweets and Desserts Corn syrup, sugars, honey, and molasses. Candy. Jam and jelly. Syrup. Sweetened cereals. Cookies, pies, cakes, donuts, muffins, and ice cream. Fats and Oils Butter, stick margarine, lard, shortening, ghee, or bacon fat. Coconut, palm kernel,  or palm oils. Beverages Alcohol. Sweetened drinks (such as sodas, lemonade, and fruit drinks or punches). The items listed above may not be a complete list of foods and beverages to avoid. Contact your dietitian for more information.   This information is not intended to replace advice given to you by your health care provider. Make sure you discuss any questions you have with your health care provider.   Document Released: 12/07/2005 Document Revised: 12/28/2014 Document Reviewed: 03/07/2014 Elsevier Interactive Patient Education [employer].

## 2016-02-19 ENCOUNTER — Ambulatory Visit: Payer: Medicare Other | Admitting: Cardiology

## 2016-02-19 LAB — CBC WITH DIFFERENTIAL/PLATELET
BASOS ABS: 0.2 10*3/uL — AB (ref 0.0–0.1)
BASOS PCT: 1 % (ref 0–1)
EOS PCT: 2 % (ref 0–5)
Eosinophils Absolute: 0.5 10*3/uL (ref 0.0–0.7)
HEMATOCRIT: 32.7 % — AB (ref 36.0–46.0)
HEMOGLOBIN: 10 g/dL — AB (ref 12.0–15.0)
LYMPHS PCT: 8 % — AB (ref 12–46)
Lymphs Abs: 1.9 10*3/uL (ref 0.7–4.0)
MCH: 20.7 pg — ABNORMAL LOW (ref 26.0–34.0)
MCHC: 30.6 g/dL (ref 30.0–36.0)
MCV: 67.8 fL — ABNORMAL LOW (ref 78.0–100.0)
Monocytes Absolute: 0.5 10*3/uL (ref 0.1–1.0)
Monocytes Relative: 2 % — ABNORMAL LOW (ref 3–12)
NEUTROS ABS: 20.5 10*3/uL — AB (ref 1.7–7.7)
Neutrophils Relative %: 87 % — ABNORMAL HIGH (ref 43–77)
Platelets: 381 10*3/uL (ref 150–400)
RBC: 4.82 MIL/uL (ref 3.87–5.11)
RDW: 22.8 % — AB (ref 11.5–15.5)
WBC: 23.6 10*3/uL — AB (ref 4.0–10.5)

## 2016-02-20 ENCOUNTER — Telehealth: Payer: Self-pay | Admitting: *Deleted

## 2016-02-20 NOTE — Telephone Encounter (Signed)
Pt notified of lab results and findings by phone with verbal understanding. Pt would like to get repeat bmet in 1 week in [place] at [place]. An Rx was mailed out to pt today for lab. Pt said thank you.

## 2016-02-26 ENCOUNTER — Other Ambulatory Visit: Payer: Self-pay | Admitting: *Deleted

## 2016-02-26 ENCOUNTER — Ambulatory Visit (HOSPITAL_COMMUNITY)
Admission: RE | Admit: 2016-02-26 | Discharge: 2016-02-26 | Disposition: A | Discharge status: Home / Self Care | Payer: Medicare Other | Source: Ambulatory Visit | Attending: Vascular Surgery | Admitting: Vascular Surgery

## 2016-02-26 ENCOUNTER — Encounter: Payer: Self-pay | Admitting: Vascular Surgery

## 2016-02-26 ENCOUNTER — Ambulatory Visit (INDEPENDENT_AMBULATORY_CARE_PROVIDER_SITE_OTHER): Payer: Medicare Other | Admitting: Vascular Surgery

## 2016-02-26 VITALS — BP 144/70 | HR 72 | Temp 98.1°F | Resp 16 | Ht 65.5 in | Wt 145.0 lb

## 2016-02-26 DIAGNOSIS — M79604 Pain in right leg: Secondary | ICD-10-CM | POA: Insufficient documentation

## 2016-02-26 DIAGNOSIS — Z48812 Encounter for surgical aftercare following surgery on the circulatory system: Secondary | ICD-10-CM

## 2016-02-26 DIAGNOSIS — E785 Hyperlipidemia, unspecified: Secondary | ICD-10-CM | POA: Diagnosis not present

## 2016-02-26 DIAGNOSIS — M7989 Other specified soft tissue disorders: Secondary | ICD-10-CM | POA: Diagnosis not present

## 2016-02-26 DIAGNOSIS — I10 Essential (primary) hypertension: Secondary | ICD-10-CM | POA: Insufficient documentation

## 2016-02-26 NOTE — Progress Notes (Signed)
Patient name: [name] MRN: [id number] DOB: 1951-11-25 Sex: female  REASON FOR VISIT: swelling and pain right calf  HPI: [name] is a 65 y.o. female who underwent a right femoropopliteal bypass graft by Dr. Kellie Simmering. Post vertically she developed an ischemic right leg and I took her to the operating room for thrombectomy of her bypass graft, endarterectomy of her below-knee popliteal artery and tibial peroneal trunk, and bovine pericardial patch angioplasty of the below-knee popliteal artery and tibial peroneal trunk. This was on 02/02/2016.  Called the office today because of pain and swelling in the right calf with redness. She has had significant pain in the right calf and swelling for several days. She denies any fever or chills.  Current Outpatient Prescriptions  Medication Sig Dispense Refill  . acetaminophen (TYLENOL) 500 MG tablet Take 1,000 mg by mouth every 6 (six) hours as needed for moderate pain.     Marland Kitchen aspirin EC 81 MG EC tablet Take 1 tablet (81 mg total) by mouth daily.    Marland Kitchen diltiazem (CARDIZEM CD) 180 MG 24 hr capsule Take 180 mg by mouth every morning.    . docusate sodium (COLACE) 100 MG capsule Take 200 mg by mouth daily as needed for mild constipation (FOR CONSTIPATION).     Marland Kitchen ELIQUIS 5 MG TABS tablet TAKE ONE TABLET BY MOUTH TWICE A DAY AS DIRECTED 60 tablet 4  . fenofibrate (TRICOR) 145 MG tablet TAKE ONE TABLET BY MOUTH AT BEDTIME 30 tablet 11  . flecainide (TAMBOCOR) 50 MG tablet TAKE ONE TABLET BY MOUTH TWICE DAILY 60 tablet 11  . hydrochlorothiazide (HYDRODIURIL) 25 MG tablet Take 25 mg by mouth 2 (two) times daily.     Marland Kitchen lisinopril (PRINIVIL,ZESTRIL) 5 MG tablet Take 5 mg by mouth daily.     Marland Kitchen omeprazole (PRILOSEC) 20 MG capsule Take 20 mg by mouth 2 (two) times daily before a meal.     . oxyCODONE-acetaminophen (PERCOCET/ROXICET) 5-325 MG tablet Take 1-2 tablets by mouth every 4 (four) hours as needed for moderate pain. 30 tablet 0  . polyvinyl alcohol  (LIQUIFILM TEARS) 1.4 % ophthalmic solution Place 1 drop into both eyes as needed for dry eyes.    . pravastatin (PRAVACHOL) 40 MG tablet TAKE 1 TABLET (40 MG TOTAL) BY MOUTH EVERY EVENING. 30 tablet 10  . traMADol (ULTRAM) 50 MG tablet Take 1 tablet (50 mg total) by mouth See admin instructions. Currently: taking med between 2-3 times daily for pain 30 tablet 0  . ZETIA 10 MG tablet TAKE ONE TABLET BY MOUTH AT BEDTIME 30 tablet 11   No current facility-administered medications for this visit.    REVIEW OF SYSTEMS:  [X]  denotes positive finding, [ ]  denotes negative finding Cardiac  Comments:  Chest pain or chest pressure:    Shortness of breath upon exertion:    Short of breath when lying flat:    Irregular heart rhythm:    Constitutional    Fever or chills:      PHYSICAL EXAM: Filed Vitals:   02/26/16 1524 02/26/16 1526  BP: 148/71 144/70  Pulse: 72 72  Temp: 98.1 F (36.7 C)   Resp: 16   Height: 5' 5.5" (1.664 m)   Weight: 145 lb (65.772 kg)   SpO2: 98%     GENERAL: The patient is a well-nourished female, in no acute distress. The vital signs are documented above. CARDIOVASCULAR: There is a regular rate and rhythm. PULMONARY: There is good air exchange  bilaterally without wheezing or rales. Her groin wound has some slight separation. Debrided this in the office today. I also removed her Dermabond from her other incisions. She has significant right calf swelling. Right foot is warm and well-perfused.  VENOUS DUPLEX: Her venous duplex scan today shows no evidence of DVT of the right lower extremity.  MEDICAL ISSUES:  STATUS POST RIGHT FEMOROPOPLITEAL BYPASS GRAFT: Patient does have significant swelling in the right calf although there is no evidence of DVT. I have instructed her on the importance of elevating her legs correctly. In addition she'll do dry dressings on her groin incision and below the knee incision. Currently there is no evidence of infection. She will keep  her regularly scheduled appointment with Dr. Kellie Simmering. She will call sooner if she has problems.  Deitra Mayo [facility]: [phone]

## 2016-02-26 NOTE — Progress Notes (Signed)
Filed Vitals:   02/26/16 1524 02/26/16 1526  BP: 148/71 144/70  Pulse: 72 72  Temp: 98.1 F (36.7 C)   Resp: 16   Height: 5' 5.5" (1.664 m)   Weight: 145 lb (65.772 kg)   SpO2: 98%

## 2016-02-27 ENCOUNTER — Other Ambulatory Visit: Payer: Self-pay

## 2016-02-27 MED ORDER — PRAVASTATIN SODIUM 40 MG PO TABS
ORAL_TABLET | ORAL | Status: DC
Start: 2016-02-27 — End: 2017-01-09

## 2016-03-03 ENCOUNTER — Other Ambulatory Visit: Payer: Self-pay | Admitting: Chiropractic Medicine

## 2016-03-03 LAB — BASIC METABOLIC PANEL
BUN: 33 mg/dL — AB (ref 7–25)
CHLORIDE: 97 mmol/L — AB (ref 98–110)
CO2: 25 mmol/L (ref 20–31)
CREATININE: 1.33 mg/dL — AB (ref 0.50–0.99)
Calcium: 9.5 mg/dL (ref 8.6–10.4)
GLUCOSE: 112 mg/dL — AB (ref 65–99)
Potassium: 5.1 mmol/L (ref 3.5–5.3)
Sodium: 133 mmol/L — ABNORMAL LOW (ref 135–146)

## 2016-03-10 ENCOUNTER — Ambulatory Visit: Payer: Medicare Other | Admitting: Oncology

## 2016-04-07 ENCOUNTER — Ambulatory Visit (INDEPENDENT_AMBULATORY_CARE_PROVIDER_SITE_OTHER): Payer: Medicare Other | Admitting: Oncology

## 2016-04-07 ENCOUNTER — Encounter: Payer: Self-pay | Admitting: Oncology

## 2016-04-07 VITALS — BP 153/56 | HR 68 | Temp 97.9°F | Ht 65.5 in | Wt 140.2 lb

## 2016-04-07 DIAGNOSIS — Z7901 Long term (current) use of anticoagulants: Secondary | ICD-10-CM | POA: Diagnosis not present

## 2016-04-07 DIAGNOSIS — D45 Polycythemia vera: Secondary | ICD-10-CM

## 2016-04-07 DIAGNOSIS — I480 Paroxysmal atrial fibrillation: Secondary | ICD-10-CM

## 2016-04-07 DIAGNOSIS — I739 Peripheral vascular disease, unspecified: Secondary | ICD-10-CM

## 2016-04-07 NOTE — Progress Notes (Signed)
Patient ID: [name], female   DOB: 1951/03/02, 65 y.o.   MRN: [id number] Hematology and Oncology Follow Up Visit  [name] [id number] November 17, 1951 65 y.o. 04/07/2016 1:36 PM   Principle Diagnosis: Encounter Diagnosis  Name Primary?  . Polycythemia vera ([place]) Yes  Clinical Summary: Pleasant 65 year old woman I initially evaluated on 07/15/2015 for unexplained leukocytosis. I initially thought we were dealing with an inflammatory process related to a foreign body reaction to multiple vascular stents, however in reviewing her laboratory data, I noted that she had a chronic anemia with hemoglobins running 12 g with MCV 90 until 03/20/2014 Hemoglobin began to rise first to 14.1 with subsequent values in 2016 up to 16 g recorded on 06/27/2015. She had normal white blood counts back in December 2010. Next white count recorded was in May 2014 at 13,300. Subsequent counts were all 10,000 or above with a count on 07/15/2015 at 17,000 with 85% neutrophils. 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 Since this affects stem cells, there can be a panmyelosis with associated elevated white count and platelet count. Her platelet count has been normal. I did not appreciate any organomegaly on my initial exam but on my subsequent exams I was able to palpate an enlarged spleen. discussed the diagnosis of myeloproliferative disorder with her in detail at time of her 08/20/2015 follow-up visit. . Predominant complication would be predisposition towards thrombosis. She was already on aspirin and Xarelto so she is covered in that regard. With respect to thrombotic risk of increased blood viscosity, current guideline recommends keeping hematocrit less than 45%. This should be accomplished very easily in this woman who is peak hematocrit has not exceeded 52%. We discussed a phlebotomy  program. I initiated a phlebotomy program removing of 500 mL's of blood monthly 3 with plans to reevaluate after that. I anticipated seeing a rapid fall in her hematocrit and then be able to space out subsequent phlebotomies to an every 3-4 month interval. She had a good understanding of all we discussed. She had her first phlebotomy Monday, September 02, 2015.  Interim History:   She underwent another extensive vascular intervention on the arteries of her right leg on 02/11 and 02/02/2016 /2017. A right common femoral to below the knee popliteal artery I pass graft, right common femoral and profunda femoris endarterectomy with patch angioplasty, done initially. The graft thrombosed and she had to undergo a thrombectomy of the bypass graft and an endarterectomy of the below the knee popliteal artery and tibial peroneal trunk with a bovine pericardial patch angioplasty.. She tolerated the procedures well. She still has some postoperative pain and swelling but is ambulating. Wounds healing well. Single drain placed initially has been removed. With the surgery, her hemoglobin fell as low as 8.9. Now recovering with most recent recorded value of 10.0 with MCV 68 on February 28. Repeat today pending. We obviously held her phlebotomies. Last phlebotomy done on February 7.  Her 26 year old daughter recently told that she had some kind of a cyst in her brain pushing on her spinal cord. She is currently under evaluation for this.  Medications: reviewed  Allergies:  Allergies  Allergen Reactions  . Prednisone Other (See Comments)    Muscle spasms  . Statins Itching and Other (See Comments)    Simvastatin-caused severe itching Pravastatin-caused lesser tiching One type of statin? Caused stroke in 2006, and other symptoms as  result  . Warfarin And Related Other (See Comments)    Caused nose bleeds  . Lactose Intolerance (Gi) Other (See Comments)    Bloating, gas  . Penicillins Hives and Other (See  Comments)    Questionable high fever when taken and broke out in whelps.   . Simvastatin Itching and Rash    Also lipitor intolerant  . Atenolol Rash  . Crestor [Rosuvastatin] Itching and Rash  . Demerol [Meperidine] Other (See Comments)    Unknown, can't remember   . Effexor [Venlafaxine Hcl] Other (See Comments)    Doesn't remember   . Inderal [Propranolol] Other (See Comments)    Doesn't remember   . Lipitor [Atorvastatin] Rash  . Lovaza [Omega-3-Acid Ethyl Esters] Other (See Comments)    Doesn't remember   . Nexium [Esomeprazole Magnesium] Rash  . Percocet [Oxycodone-Acetaminophen] Other (See Comments)    Doesn't remember   . Plavix [Clopidogrel Bisulfate] Rash  . Pravastatin Itching  . Trilipix [Choline Fenofibrate] Other (See Comments)    Doesn't remember     Review of Systems: See interim history  She stopped smoking 3 years ago. Remaining ROS negative:   Physical Exam: Blood pressure 153/56, pulse 68, temperature 97.9 F (36.6 C), temperature source Oral, height 5' 5.5" (1.664 m), weight 140 lb 3.2 oz (63.594 kg), SpO2 100 %. Wt Readings from Last 3 Encounters:  04/07/16 140 lb 3.2 oz (63.594 kg)  02/26/16 145 lb (65.772 kg)  02/18/16 146 lb 4.8 oz (66.361 kg)     General appearance: Thin Caucasian woman HENNT: Pharynx no erythema, exudate, mass, or ulcer. No thyromegaly or thyroid nodules Lymph nodes: No cervical, supraclavicular, or axillary lymphadenopathy Breasts: Lungs: Clear to auscultation, resonant to percussion throughout Heart: Regular rhythm, no murmur, no gallop, no rub, no click, no edema Abdomen: Soft, nontender, normal bowel sounds, no mass, no organomegaly Extremities: Right ankle edema, no calf tenderness Musculoskeletal: no joint deformities GU:  Vascular: Carotid pulses 2+, no bruits, distal pulses: Dorsalis pedis pulses not palpable. Multiple healing surgical scars along the medial left leg status post recent vascular procedure. Neurologic:  Alert, oriented, PERRLA, I was unable to get a clear look at the fundi , cranial nerves grossly normal, motor strength 5 over 5, reflexes 1+ symmetric, upper body coordination normal, gait normal, Skin: No rash; multiple ecchymoses on the skin of her hands and forearms right greater than left  Lab Results: CBC W/Diff    Component Value Date/Time   WBC 23.6* 02/18/2016 1342   RBC 4.82 02/18/2016 1342   HGB 10.0* 02/18/2016 1342   HCT 32.7* 02/18/2016 1342   PLT 381 02/18/2016 1342   MCV 67.8* 02/18/2016 1342   MCH 20.7* 02/18/2016 1342   MCHC 30.6 02/18/2016 1342   RDW 22.8* 02/18/2016 1342   LYMPHSABS 1.9 02/18/2016 1342   MONOABS 0.5 02/18/2016 1342   EOSABS 0.5 02/18/2016 1342   BASOSABS 0.2* 02/18/2016 1342     Chemistry      Component Value Date/Time   NA 133* 03/03/2016 1531   K 5.1 03/03/2016 1531   CL 97* 03/03/2016 1531   CO2 25 03/03/2016 1531   BUN 33* 03/03/2016 1531   CREATININE 1.33* 03/03/2016 1531   CREATININE 0.85 02/06/2016 0350      Component Value Date/Time   CALCIUM 9.5 03/03/2016 1531   ALKPHOS 54 01/29/2016 0035   AST 20 01/29/2016 0035   ALT 13* 01/29/2016 0035   BILITOT 0.4 01/29/2016 0035       Radiological  Studies: No results found.  Impression:  #1. Polycythemia vera  diagnosed July 2016 On anticoagulation and a phlebotomy program. Phlebotomy currently on hold due to surgical blood loss. The goal of phlebotomy in general is to induce an iron deficiency which puts a break on blood production so that the interval between phlebotomies can be spaced out. I will allow her bone marrow to recover on its own. If hemoglobin begins to rise to 13 g or above, then resume periodic phlebotomy. Continue low-dose aspirin and anticoagulation.  #2. Chronic anticoagulation due to paroxysmal age of fibrillation and extensive peripheral vascular disease also indicated for her myeloproliferative disorder.  #3. Coronary artery disease with history of a an STEMI  May 2014  #4. Paroxysmal atrial fibrillation on treatment outlined above  #5. Cerebrovascular disease with bilateral carotid stenosis  #6. Peripheral vascular disease with claudication status post previous left external iliac stent and right SFA stent. Recent February 2017 right femoral-popliteal bypass.  #7. History of TIA 2006  #8. Hyperlipidemia  #9. Glaucoma.  #10. GERD  #11. Degenerative arthritis of the knees   CC: Patient Care Team: Octavio Graves, DO as PCP - General Lorretta Harp, MD as Consulting Physician (Cardiology) Annia Belt, MD as Consulting Physician (Oncology)   Annia Belt, MD 4/18/20171:36 PM

## 2016-04-07 NOTE — Patient Instructions (Signed)
To lab today the schedule CBC for every 2 months x 6 MD visit 6 months We will stop phlebotomy until you red count recovers from surgery and is back to 13 or above

## 2016-04-08 LAB — CBC WITH DIFFERENTIAL/PLATELET
Basophils Absolute: 0.2 10*3/uL (ref 0.0–0.2)
Basos: 1 %
EOS (ABSOLUTE): 0.5 10*3/uL — ABNORMAL HIGH (ref 0.0–0.4)
EOS: 3 %
HEMOGLOBIN: 11 g/dL — AB (ref 11.1–15.9)
Hematocrit: 37.4 % (ref 34.0–46.6)
Immature Grans (Abs): 0.2 10*3/uL — ABNORMAL HIGH (ref 0.0–0.1)
Immature Granulocytes: 1 %
LYMPHS ABS: 1.4 10*3/uL (ref 0.7–3.1)
Lymphs: 7 %
MCH: 20 pg — AB (ref 26.6–33.0)
MCHC: 29.4 g/dL — ABNORMAL LOW (ref 31.5–35.7)
MCV: 68 fL — AB (ref 79–97)
MONOCYTES: 3 %
Monocytes Absolute: 0.5 10*3/uL (ref 0.1–0.9)
NEUTROS ABS: 15.9 10*3/uL — AB (ref 1.4–7.0)
NRBC: 0 % (ref 0–0)
Neutrophils: 85 %
Platelets: 409 10*3/uL — ABNORMAL HIGH (ref 150–379)
RBC: 5.49 x10E6/uL — AB (ref 3.77–5.28)
RDW: 20.8 % — ABNORMAL HIGH (ref 12.3–15.4)
WBC: 18.7 10*3/uL — ABNORMAL HIGH (ref 3.4–10.8)

## 2016-04-08 LAB — FERRITIN: Ferritin: 18 ng/mL (ref 15–150)

## 2016-04-09 ENCOUNTER — Telehealth: Payer: Self-pay | Admitting: *Deleted

## 2016-04-09 NOTE — Telephone Encounter (Signed)
Pt called / informed Hgb 11.0; no phlebotomy at this time and check lab in 2 months per Dr Beryle Beams.  Lab appt scheduled June 19 @1000  AM.

## 2016-04-09 NOTE — Telephone Encounter (Signed)
-----   Message from Annia Belt, MD sent at 04/08/2016  9:07 AM EDT ----- Call pt: Hb 11. We are still good to watch and hold off on phlebotomy. Check counts every 2 months

## 2016-05-13 ENCOUNTER — Encounter: Payer: Self-pay | Admitting: Vascular Surgery

## 2016-05-19 ENCOUNTER — Ambulatory Visit (INDEPENDENT_AMBULATORY_CARE_PROVIDER_SITE_OTHER)
Admission: RE | Admit: 2016-05-19 | Discharge: 2016-05-19 | Disposition: A | Discharge status: Home / Self Care | Payer: Medicare Other | Source: Ambulatory Visit | Attending: Vascular Surgery | Admitting: Vascular Surgery

## 2016-05-19 ENCOUNTER — Ambulatory Visit (HOSPITAL_COMMUNITY)
Admission: RE | Admit: 2016-05-19 | Discharge: 2016-05-19 | Disposition: A | Discharge status: Home / Self Care | Payer: Medicare Other | Source: Ambulatory Visit | Attending: Vascular Surgery | Admitting: Vascular Surgery

## 2016-05-19 ENCOUNTER — Ambulatory Visit (INDEPENDENT_AMBULATORY_CARE_PROVIDER_SITE_OTHER): Payer: Medicare Other | Admitting: Vascular Surgery

## 2016-05-19 ENCOUNTER — Encounter: Payer: Self-pay | Admitting: Vascular Surgery

## 2016-05-19 VITALS — BP 133/74 | HR 74 | Temp 97.0°F | Resp 16 | Ht 65.0 in | Wt 137.0 lb

## 2016-05-19 DIAGNOSIS — E785 Hyperlipidemia, unspecified: Secondary | ICD-10-CM | POA: Diagnosis not present

## 2016-05-19 DIAGNOSIS — I739 Peripheral vascular disease, unspecified: Secondary | ICD-10-CM | POA: Insufficient documentation

## 2016-05-19 DIAGNOSIS — I10 Essential (primary) hypertension: Secondary | ICD-10-CM | POA: Insufficient documentation

## 2016-05-19 DIAGNOSIS — K219 Gastro-esophageal reflux disease without esophagitis: Secondary | ICD-10-CM | POA: Diagnosis not present

## 2016-05-19 DIAGNOSIS — R938 Abnormal findings on diagnostic imaging of other specified body structures: Secondary | ICD-10-CM | POA: Diagnosis not present

## 2016-05-19 DIAGNOSIS — I2510 Atherosclerotic heart disease of native coronary artery without angina pectoris: Secondary | ICD-10-CM | POA: Insufficient documentation

## 2016-05-19 NOTE — Progress Notes (Signed)
Subjective:     Patient ID: [name], female   DOB: 04/28/1951, 65 y.o.   MRN: [id number]  HPI this 65 year old female returns for continued follow-up regarding her right femoral popliteal bypass graft with Gore-Tex performed in February 2017. I perform that procedure and 36 hours later the bypass thrombosed acutely and Dr. Gae Gallop returned to the patient to the operating room for thrombectomy. He also performed extensive endarterectomy of the tibioperoneal trunk with a bovine patch angioplasty. The bypass has remained patent since that time she's had no symptoms in the right leg. She does have some mild left leg symptoms after walking about a mile due to known occlusive disease in the left leg. She also takes aspirin and Eliquis.  Past Medical History  Diagnosis Date  . Hypertension   . PVD (peripheral vascular disease) with claudication ([place]) 07/2006    previous L ext iliac stent and rt SFA stent, known occluded Lt SFA  . Paroxysmal atrial fibrillation (HCC)     hx of a fib on ASA  AND ELIQUIS   . H/O cardiovascular stress test 12/27/2009    negative for ischemia  . NSTEMI (non-ST elevated myocardial infarction) ([place]) 04/27/2013  . S/P cardiac cath 04/27/13    NON OBSTRUCTIVE DISEASE, 2+ mr, ef 65-70%  . CAD (coronary artery disease), per cath 04/27/13, non obstructive disease, EF 65-70% 05/11/2013  . Mitral regurgitation,2+ by cath 05/11/2013  . Statin intolerance   . Carotid disease, bilateral ([place]) 09/2013    Lt carotid 50-69% stentosis, less on rt  . Hyperlipidemia   . Dysrhythmia   . GERD (gastroesophageal reflux disease)   . Pain     BOTH KNEES - PT HAS TORN MENISCUS LEFT KNEE  . Glaucoma   . Fatty liver   . Leukocytosis 07/15/2015  . Polycythemia vera ([place]) 08/20/2015    JAK-2 positive 07/19/15  . Headache     "occasional since eye pressure regulated" (01/28/2016)  . Migraine     "stopped w/laser holes to relieve pressure in my eyes; had them 3-4 times/wk before taht" (01/28/2016)   . Stroke ([place]) 2006    SWELLING ALL OVER AND RT SIDE OF FACE DRAWN AND SPEECH SLURRED AND NUMBNESS ON RIGHT SIDE-- ALL RESOLVED  . Arthritis     "fingers, some in my knees" (01/28/2016)    Social History  Substance Use Topics  . Smoking status: Former Smoker -- 1.00 packs/day for 45 years    Types: Cigarettes    Quit date: 11/11/2012  . Smokeless tobacco: Never Used  . Alcohol Use: No    Family History  Problem Relation Age of Onset  . CAD Mother   . Lung cancer Mother   . Heart attack Mother   . CAD Father   . Heart disease Father   . Heart attack Father 25    died in hes 12's with heart disease  . Healthy Sister   . Liver cancer Brother   . Healthy Sister   . CAD Brother     has had CABG  . CAD Brother     Allergies  Allergen Reactions  . Prednisone Other (See Comments)    Muscle spasms  . Statins Itching and Other (See Comments)    Simvastatin-caused severe itching Pravastatin-caused lesser tiching One type of statin? Caused stroke in 2006, and other symptoms as result  . Warfarin And Related Other (See Comments)    Caused nose bleeds  . Lactose Intolerance (Gi) Other (See Comments)  Bloating, gas  . Penicillins Hives and Other (See Comments)    Questionable high fever when taken and broke out in whelps.   . Simvastatin Itching and Rash    Also lipitor intolerant  . Atenolol Rash  . Crestor [Rosuvastatin] Itching and Rash  . Demerol [Meperidine] Other (See Comments)    Unknown, can't remember   . Effexor [Venlafaxine Hcl] Other (See Comments)    Doesn't remember   . Inderal [Propranolol] Other (See Comments)    Doesn't remember   . Lipitor [Atorvastatin] Rash  . Lovaza [Omega-3-Acid Ethyl Esters] Other (See Comments)    Doesn't remember   . Nexium [Esomeprazole Magnesium] Rash  . Percocet [Oxycodone-Acetaminophen] Other (See Comments)    Doesn't remember   . Plavix [Clopidogrel Bisulfate] Rash  . Pravastatin Itching  . Trilipix [Choline  Fenofibrate] Other (See Comments)    Doesn't remember      Current outpatient prescriptions:  .  acetaminophen (TYLENOL) 500 MG tablet, Take 1,000 mg by mouth every 6 (six) hours as needed for moderate pain. , Disp: , Rfl:  .  aspirin EC 81 MG EC tablet, Take 1 tablet (81 mg total) by mouth daily., Disp: , Rfl:  .  diltiazem (CARDIZEM CD) 180 MG 24 hr capsule, Take 180 mg by mouth every morning., Disp: , Rfl:  .  docusate sodium (COLACE) 100 MG capsule, Take 200 mg by mouth daily as needed for mild constipation (FOR CONSTIPATION). , Disp: , Rfl:  .  ELIQUIS 5 MG TABS tablet, TAKE ONE TABLET BY MOUTH TWICE A DAY AS DIRECTED, Disp: 60 tablet, Rfl: 4 .  fenofibrate (TRICOR) 145 MG tablet, TAKE ONE TABLET BY MOUTH AT BEDTIME, Disp: 30 tablet, Rfl: 11 .  flecainide (TAMBOCOR) 50 MG tablet, TAKE ONE TABLET BY MOUTH TWICE DAILY, Disp: 60 tablet, Rfl: 11 .  hydrochlorothiazide (HYDRODIURIL) 25 MG tablet, Take 25 mg by mouth 2 (two) times daily. , Disp: , Rfl:  .  lisinopril (PRINIVIL,ZESTRIL) 5 MG tablet, Take 5 mg by mouth daily. , Disp: , Rfl:  .  omeprazole (PRILOSEC) 20 MG capsule, Take 20 mg by mouth 2 (two) times daily before a meal. , Disp: , Rfl:  .  polyvinyl alcohol (LIQUIFILM TEARS) 1.4 % ophthalmic solution, Place 1 drop into both eyes as needed for dry eyes., Disp: , Rfl:  .  traMADol (ULTRAM) 50 MG tablet, Take 1 tablet (50 mg total) by mouth See admin instructions. Currently: taking med between 2-3 times daily for pain, Disp: 30 tablet, Rfl: 0 .  ZETIA 10 MG tablet, TAKE ONE TABLET BY MOUTH AT BEDTIME, Disp: 30 tablet, Rfl: 11 .  oxyCODONE-acetaminophen (PERCOCET/ROXICET) 5-325 MG tablet, Take 1-2 tablets by mouth every 4 (four) hours as needed for moderate pain. (Patient not taking: Reported on 05/19/2016), Disp: 30 tablet, Rfl: 0 .  pravastatin (PRAVACHOL) 40 MG tablet, TAKE 1 TABLET (40 MG TOTAL) BY MOUTH EVERY EVENING. (Patient not taking: Reported on 05/19/2016), Disp: 90 tablet, Rfl:  3  Filed Vitals:   05/19/16 1354  BP: 133/74  Pulse: 74  Temp: 97 F (36.1 C)  Resp: 16  Height: 5\' 5"  (1.651 m)  Weight: 137 lb (62.143 kg)  SpO2: 97%    Body mass index is 22.8 kg/(m^2).           Review of Systems has history of paroxysmal atrial fibrillation and is on chronic anticoagulation as noted above, coronary artery disease, hypertension. Patient denies lateralizing weakness, aphasia, amaurosis fugax, diplopia, blurred vision,  or syncope.     Objective:   Physical Exam BP 133/74 mmHg  Pulse 74  Temp(Src) 97 F (36.1 C)  Resp 16  Ht 5\' 5"  (1.651 m)  Wt 137 lb (62.143 kg)  BMI 22.80 kg/m2  SpO2 97%    Gen.-alert and oriented x3 in no apparent distress HEENT normal for age Lungs no rhonchi or wheezing Cardiovascular regular rhythm no murmurs carotid pulses 3+ palpable no bruits audible Abdomen soft nontender no palpable masses Musculoskeletal free of  major deformities Skin clear -no rashes Neurologic normal Lower extremities 3+ femoral and 2+ posterior tibial pulse palpable right leg. Mild edema below the knee on the right. Left leg with 2-3+ femoral pulse palpable but no popliteal or distal pulses palpable.  Today I ordered ABIs and duplex scan of the right femoral-popliteal graft which I reviewed and interpreted. The right posterior tibial is 0.8 to the left posterior tibial is 0.63. Duplex scan of the bypass reveals it to be widely patent with no areas of stenosis.       Assessment:     Widely patent right femoral-popliteal Gore-Tex-below the knee-bypass requiring thrombectomy and endarterectomy of tibioperoneal trunk with bovine patch angioplasty by Dr. Gae Gallop Chronic paroxysmal atrial fibrillation on anticoagulation Hypertension Coronary artery disease    Plan:     Patient return in 6 months with repeat ABIs and duplex scan of right lower extremity bypass graft and see Dr. Scot Dock If she should develop a significant change in  her symptoms particular in the right calf she will be back in touch with [place] at that time to study the bypass

## 2016-06-08 ENCOUNTER — Other Ambulatory Visit (INDEPENDENT_AMBULATORY_CARE_PROVIDER_SITE_OTHER): Payer: Medicare Other

## 2016-06-08 DIAGNOSIS — D45 Polycythemia vera: Secondary | ICD-10-CM | POA: Diagnosis not present

## 2016-06-09 LAB — CBC WITH DIFFERENTIAL/PLATELET
BASOS ABS: 0.2 10*3/uL (ref 0.0–0.2)
Basos: 1 %
EOS (ABSOLUTE): 0.5 10*3/uL — AB (ref 0.0–0.4)
Eos: 2 %
HEMOGLOBIN: 10.7 g/dL — AB (ref 11.1–15.9)
Hematocrit: 35 % (ref 34.0–46.6)
IMMATURE GRANS (ABS): 0.3 10*3/uL — AB (ref 0.0–0.1)
IMMATURE GRANULOCYTES: 2 %
LYMPHS: 5 %
Lymphocytes Absolute: 1.1 10*3/uL (ref 0.7–3.1)
MCH: 19.1 pg — AB (ref 26.6–33.0)
MCHC: 30.6 g/dL — ABNORMAL LOW (ref 31.5–35.7)
MCV: 63 fL — ABNORMAL LOW (ref 79–97)
MONOCYTES: 3 %
Monocytes Absolute: 0.6 10*3/uL (ref 0.1–0.9)
NEUTROS PCT: 87 %
Neutrophils Absolute: 19.5 10*3/uL — ABNORMAL HIGH (ref 1.4–7.0)
PLATELETS: 430 10*3/uL — AB (ref 150–379)
RBC: 5.6 x10E6/uL — AB (ref 3.77–5.28)
RDW: 20.5 % — ABNORMAL HIGH (ref 12.3–15.4)
WBC: 22.1 10*3/uL — AB (ref 3.4–10.8)

## 2016-07-01 ENCOUNTER — Encounter (INDEPENDENT_AMBULATORY_CARE_PROVIDER_SITE_OTHER): Payer: Self-pay | Admitting: *Deleted

## 2016-07-01 ENCOUNTER — Encounter (INDEPENDENT_AMBULATORY_CARE_PROVIDER_SITE_OTHER): Payer: Self-pay

## 2016-07-07 ENCOUNTER — Other Ambulatory Visit: Payer: Self-pay | Admitting: Cardiovascular Disease

## 2016-07-15 DIAGNOSIS — K219 Gastro-esophageal reflux disease without esophagitis: Secondary | ICD-10-CM | POA: Insufficient documentation

## 2016-07-15 DIAGNOSIS — I482 Chronic atrial fibrillation, unspecified: Secondary | ICD-10-CM | POA: Insufficient documentation

## 2016-07-15 DIAGNOSIS — E785 Hyperlipidemia, unspecified: Secondary | ICD-10-CM | POA: Insufficient documentation

## 2016-07-17 NOTE — Addendum Note (Signed)
Addended by: Thresa Ross C on: 07/17/2016 02:00 PM   Modules accepted: Orders

## 2016-07-23 ENCOUNTER — Other Ambulatory Visit: Payer: Self-pay | Admitting: Cardiovascular Disease

## 2016-07-24 NOTE — Telephone Encounter (Signed)
Rx(s) sent to pharmacy electronically.  

## 2016-07-27 ENCOUNTER — Telehealth: Payer: Self-pay | Admitting: *Deleted

## 2016-07-27 NOTE — Telephone Encounter (Signed)
APT. REMINDER CALL, LMTCB °

## 2016-07-28 ENCOUNTER — Other Ambulatory Visit (INDEPENDENT_AMBULATORY_CARE_PROVIDER_SITE_OTHER): Payer: Medicare Other

## 2016-07-28 DIAGNOSIS — D45 Polycythemia vera: Secondary | ICD-10-CM

## 2016-07-29 LAB — CBC WITH DIFFERENTIAL/PLATELET
BASOS ABS: 0.2 10*3/uL (ref 0.0–0.2)
Basos: 1 %
EOS (ABSOLUTE): 0.5 10*3/uL — ABNORMAL HIGH (ref 0.0–0.4)
Eos: 2 %
HEMATOCRIT: 35.3 % (ref 34.0–46.6)
Hemoglobin: 10.7 g/dL — ABNORMAL LOW (ref 11.1–15.9)
IMMATURE GRANS (ABS): 0.6 10*3/uL — AB (ref 0.0–0.1)
Immature Granulocytes: 3 %
LYMPHS: 5 %
Lymphocytes Absolute: 1.1 10*3/uL (ref 0.7–3.1)
MCH: 19.1 pg — ABNORMAL LOW (ref 26.6–33.0)
MCHC: 30.3 g/dL — ABNORMAL LOW (ref 31.5–35.7)
MCV: 63 fL — ABNORMAL LOW (ref 79–97)
MONOCYTES: 3 %
Monocytes Absolute: 0.6 10*3/uL (ref 0.1–0.9)
NEUTROS ABS: 18.8 10*3/uL — AB (ref 1.4–7.0)
NRBC: 1 % — AB (ref 0–0)
Neutrophils: 86 %
PLATELETS: 498 10*3/uL — AB (ref 150–379)
RBC: 5.6 x10E6/uL — AB (ref 3.77–5.28)
RDW: 21.6 % — AB (ref 12.3–15.4)
WBC: 21.7 10*3/uL (ref 3.4–10.8)

## 2016-07-30 ENCOUNTER — Telehealth (INDEPENDENT_AMBULATORY_CARE_PROVIDER_SITE_OTHER): Payer: Self-pay | Admitting: *Deleted

## 2016-07-30 ENCOUNTER — Encounter (INDEPENDENT_AMBULATORY_CARE_PROVIDER_SITE_OTHER): Payer: Self-pay | Admitting: *Deleted

## 2016-07-30 ENCOUNTER — Other Ambulatory Visit (INDEPENDENT_AMBULATORY_CARE_PROVIDER_SITE_OTHER): Payer: Self-pay | Admitting: *Deleted

## 2016-07-30 DIAGNOSIS — Z86010 Personal history of colonic polyps: Secondary | ICD-10-CM

## 2016-07-30 NOTE — Telephone Encounter (Signed)
Patient needs trilyte 

## 2016-08-05 MED ORDER — PEG 3350-KCL-NA BICARB-NACL 420 G PO SOLR: 4000.0000 mL | Freq: Once | ORAL | 0 refills | Status: AC

## 2016-08-06 ENCOUNTER — Ambulatory Visit (HOSPITAL_COMMUNITY)
Admission: RE | Admit: 2016-08-06 | Discharge: 2016-08-06 | Disposition: A | Discharge status: Home / Self Care | Payer: Medicare Other | Source: Ambulatory Visit | Attending: Cardiovascular Disease | Admitting: Cardiovascular Disease

## 2016-08-06 DIAGNOSIS — I739 Peripheral vascular disease, unspecified: Secondary | ICD-10-CM | POA: Insufficient documentation

## 2016-08-06 DIAGNOSIS — E785 Hyperlipidemia, unspecified: Secondary | ICD-10-CM | POA: Insufficient documentation

## 2016-08-06 DIAGNOSIS — K219 Gastro-esophageal reflux disease without esophagitis: Secondary | ICD-10-CM | POA: Diagnosis not present

## 2016-08-06 DIAGNOSIS — R609 Edema, unspecified: Secondary | ICD-10-CM | POA: Insufficient documentation

## 2016-08-06 DIAGNOSIS — I10 Essential (primary) hypertension: Secondary | ICD-10-CM | POA: Insufficient documentation

## 2016-08-06 DIAGNOSIS — I2510 Atherosclerotic heart disease of native coronary artery without angina pectoris: Secondary | ICD-10-CM | POA: Diagnosis not present

## 2016-08-06 DIAGNOSIS — R600 Localized edema: Secondary | ICD-10-CM

## 2016-08-06 DIAGNOSIS — I6523 Occlusion and stenosis of bilateral carotid arteries: Secondary | ICD-10-CM | POA: Diagnosis not present

## 2016-08-06 DIAGNOSIS — I6522 Occlusion and stenosis of left carotid artery: Secondary | ICD-10-CM

## 2016-08-07 ENCOUNTER — Other Ambulatory Visit: Payer: Self-pay | Admitting: *Deleted

## 2016-08-07 DIAGNOSIS — I779 Disorder of arteries and arterioles, unspecified: Secondary | ICD-10-CM

## 2016-08-07 DIAGNOSIS — I739 Peripheral vascular disease, unspecified: Principal | ICD-10-CM

## 2016-08-08 ENCOUNTER — Other Ambulatory Visit: Payer: Self-pay | Admitting: Cardiovascular Disease

## 2016-08-12 ENCOUNTER — Telehealth (INDEPENDENT_AMBULATORY_CARE_PROVIDER_SITE_OTHER): Payer: Self-pay | Admitting: *Deleted

## 2016-08-12 NOTE — Telephone Encounter (Signed)
Referring MD/PCP: [email or web address]   Procedure: tcs  Reason/Indication:  Hx polyps  Has patient had this procedure before?  Yes, 2010  If so, when, by whom and where?    Is there a family history of colon cancer?  no  Who?  What age when diagnosed?    Is patient diabetic?   no      Does patient have prosthetic heart valve or mechanical valve?  no  Do you have a pacemaker?  no  Has patient ever had endocarditis? no  Has patient had joint replacement within last 12 months?  no  Does patient tend to be constipated or take laxatives? yes  Does patient have a history of alcohol/drug use?  no  Is patient on Coumadin, Plavix and/or Aspirin? yes  Medications: asa 81 mg daily, eliquis 5 mg bid, diltiazem 180 mg daily, lisinopril 10 mg daily, omeprazole 20 mg daily, flecainide 50 mg bid, stool softener, zetia 10 mg daily, fenofibrate 145 mg daily, , tylenol prn, benadryl prn  Allergies: see epic  Medication Adjustment: asa & eliquis 2 days  Procedure date & time: 09/11/16

## 2016-08-13 NOTE — Telephone Encounter (Signed)
agree

## 2016-08-28 ENCOUNTER — Encounter (INDEPENDENT_AMBULATORY_CARE_PROVIDER_SITE_OTHER): Payer: Self-pay | Admitting: *Deleted

## 2016-09-01 ENCOUNTER — Other Ambulatory Visit (HOSPITAL_COMMUNITY): Payer: Self-pay | Admitting: Adult Health Nurse Practitioner

## 2016-09-01 DIAGNOSIS — Z1231 Encounter for screening mammogram for malignant neoplasm of breast: Secondary | ICD-10-CM

## 2016-09-07 ENCOUNTER — Ambulatory Visit (HOSPITAL_COMMUNITY)
Admission: RE | Admit: 2016-09-07 | Discharge: 2016-09-07 | Disposition: A | Discharge status: Home / Self Care | Payer: Medicare Other | Source: Ambulatory Visit | Attending: Adult Health Nurse Practitioner | Admitting: Adult Health Nurse Practitioner

## 2016-09-07 ENCOUNTER — Encounter (HOSPITAL_COMMUNITY): Payer: Self-pay

## 2016-09-07 DIAGNOSIS — Z1231 Encounter for screening mammogram for malignant neoplasm of breast: Secondary | ICD-10-CM

## 2016-09-09 ENCOUNTER — Ambulatory Visit (HOSPITAL_COMMUNITY): Payer: Medicare Other

## 2016-09-11 ENCOUNTER — Encounter (HOSPITAL_COMMUNITY): Payer: Self-pay | Admitting: *Deleted

## 2016-09-11 ENCOUNTER — Ambulatory Visit (HOSPITAL_COMMUNITY)
Admission: RE | Admit: 2016-09-11 | Discharge: 2016-09-11 | Disposition: A | Discharge status: Home / Self Care | Payer: Medicare Other | Source: Ambulatory Visit | Attending: Internal Medicine | Admitting: Internal Medicine

## 2016-09-11 ENCOUNTER — Encounter (HOSPITAL_COMMUNITY)
Admission: RE | Disposition: A | Discharge status: Home / Self Care | Payer: Self-pay | Source: Ambulatory Visit | Attending: Internal Medicine

## 2016-09-11 DIAGNOSIS — I2510 Atherosclerotic heart disease of native coronary artery without angina pectoris: Secondary | ICD-10-CM | POA: Insufficient documentation

## 2016-09-11 DIAGNOSIS — E785 Hyperlipidemia, unspecified: Secondary | ICD-10-CM | POA: Insufficient documentation

## 2016-09-11 DIAGNOSIS — M159 Polyosteoarthritis, unspecified: Secondary | ICD-10-CM | POA: Insufficient documentation

## 2016-09-11 DIAGNOSIS — I739 Peripheral vascular disease, unspecified: Secondary | ICD-10-CM | POA: Insufficient documentation

## 2016-09-11 DIAGNOSIS — Z79899 Other long term (current) drug therapy: Secondary | ICD-10-CM | POA: Insufficient documentation

## 2016-09-11 DIAGNOSIS — Z1211 Encounter for screening for malignant neoplasm of colon: Secondary | ICD-10-CM | POA: Diagnosis not present

## 2016-09-11 DIAGNOSIS — I340 Nonrheumatic mitral (valve) insufficiency: Secondary | ICD-10-CM | POA: Insufficient documentation

## 2016-09-11 DIAGNOSIS — I252 Old myocardial infarction: Secondary | ICD-10-CM | POA: Diagnosis not present

## 2016-09-11 DIAGNOSIS — Z87891 Personal history of nicotine dependence: Secondary | ICD-10-CM | POA: Insufficient documentation

## 2016-09-11 DIAGNOSIS — Z8673 Personal history of transient ischemic attack (TIA), and cerebral infarction without residual deficits: Secondary | ICD-10-CM | POA: Diagnosis not present

## 2016-09-11 DIAGNOSIS — I10 Essential (primary) hypertension: Secondary | ICD-10-CM | POA: Insufficient documentation

## 2016-09-11 DIAGNOSIS — Z86010 Personal history of colonic polyps: Secondary | ICD-10-CM | POA: Insufficient documentation

## 2016-09-11 DIAGNOSIS — Z7982 Long term (current) use of aspirin: Secondary | ICD-10-CM | POA: Insufficient documentation

## 2016-09-11 DIAGNOSIS — Z09 Encounter for follow-up examination after completed treatment for conditions other than malignant neoplasm: Secondary | ICD-10-CM | POA: Diagnosis not present

## 2016-09-11 DIAGNOSIS — Z538 Procedure and treatment not carried out for other reasons: Secondary | ICD-10-CM | POA: Diagnosis not present

## 2016-09-11 DIAGNOSIS — Z7901 Long term (current) use of anticoagulants: Secondary | ICD-10-CM | POA: Diagnosis not present

## 2016-09-11 DIAGNOSIS — K219 Gastro-esophageal reflux disease without esophagitis: Secondary | ICD-10-CM | POA: Insufficient documentation

## 2016-09-11 DIAGNOSIS — I480 Paroxysmal atrial fibrillation: Secondary | ICD-10-CM | POA: Diagnosis not present

## 2016-09-11 DIAGNOSIS — K644 Residual hemorrhoidal skin tags: Secondary | ICD-10-CM | POA: Insufficient documentation

## 2016-09-11 DIAGNOSIS — Z95820 Peripheral vascular angioplasty status with implants and grafts: Secondary | ICD-10-CM | POA: Insufficient documentation

## 2016-09-11 HISTORY — PX: COLONOSCOPY: SHX5424

## 2016-09-11 SURGERY — COLONOSCOPY
Anesthesia: Moderate Sedation

## 2016-09-11 MED ORDER — MEPERIDINE HCL 50 MG/ML IJ SOLN: INTRAMUSCULAR | Status: DC | PRN

## 2016-09-11 MED ORDER — SODIUM CHLORIDE 0.9 % IV SOLN
INTRAVENOUS | Status: DC
  Administered 2016-09-11: 1000 mL via INTRAVENOUS

## 2016-09-11 MED ORDER — MIDAZOLAM HCL 5 MG/5ML IJ SOLN
INTRAMUSCULAR | Status: DC | PRN
  Administered 2016-09-11 (×4): 2 mg via INTRAVENOUS

## 2016-09-11 MED ORDER — STERILE WATER FOR IRRIGATION IR SOLN
Status: DC | PRN
  Administered 2016-09-11: 100 mL

## 2016-09-11 MED ORDER — FENTANYL CITRATE (PF) 100 MCG/2ML IJ SOLN
INTRAMUSCULAR | Status: AC
  Filled 2016-09-11: qty 2

## 2016-09-11 MED ORDER — MIDAZOLAM HCL 5 MG/5ML IJ SOLN
INTRAMUSCULAR | Status: AC
  Filled 2016-09-11: qty 10

## 2016-09-11 MED ORDER — MEPERIDINE HCL 50 MG/ML IJ SOLN
INTRAMUSCULAR | Status: AC
  Filled 2016-09-11: qty 1

## 2016-09-11 MED ORDER — FENTANYL CITRATE (PF) 250 MCG/5ML IJ SOLN
INTRAMUSCULAR | Status: DC | PRN
  Administered 2016-09-11 (×4): 25 ug via INTRAVENOUS

## 2016-09-11 NOTE — Discharge Instructions (Signed)
Colonoscopy, Care After These instructions give you information on caring for yourself after your procedure. Your doctor may also give you more specific instructions. Call your doctor if you have any problems or questions after your procedure. HOME CARE  Do not drive for 24 hours.  Do not sign important papers or use machinery for 24 hours.  You may shower.  You may go back to your usual activities, but go slower for the first 24 hours.  Take rest breaks often during the first 24 hours.  Walk around or use warm packs on your belly (abdomen) if you have belly cramping or gas.  Drink enough fluids to keep your pee (urine) clear or pale yellow.  Resume your normal diet. Avoid heavy or fried foods.  Avoid drinking alcohol for 24 hours or as told by your doctor.  Only take medicines as told by your doctor. If a tissue sample (biopsy) was taken during the procedure:   Do not take aspirin or blood thinners for 7 days, or as told by your doctor.  Do not drink alcohol for 7 days, or as told by your doctor.  Eat soft foods for the first 24 hours. GET HELP IF: You still have a small amount of blood in your poop (stool) 2-3 days after the procedure. GET HELP RIGHT AWAY IF:  You have more than a small amount of blood in your poop.  You see clumps of tissue (blood clots) in your poop.  Your belly is puffy (swollen).  You feel sick to your stomach (nauseous) or throw up (vomit).  You have a fever.  You have belly pain that gets worse and medicine does not help. MAKE SURE YOU:  Understand these instructions.  Will watch your condition.  Will get help right away if you are not doing well or get worse.   This information is not intended to replace advice given to you by your health care provider. Make sure you discuss any questions you have with your health care provider.   Document Released: 01/09/2011 Document Revised: 12/12/2013 Document Reviewed: 08/14/2013 Elsevier  Interactive Patient Education 2016 [place] usual medications and diet. No driving for 24 hours. Consider repeat colonoscopy under monitored anesthesia care or virtual colonoscopy. Will discuss with patient next week.

## 2016-09-11 NOTE — H&P (Signed)
[name] is an 65 y.o. female.   Chief Complaint: Patient is here for colonoscopy. HPI: 65 year old Caucasian female who underwent colonoscopy 7 years ago and was found to have 6 mm tubular adenoma. She is advised to return for repeat exam in 7 years. She denies abdominal pain change in bowel habits or rectal bleeding. Family is reasonable negative for CRC. Patient remains on aspirin and Eliquis.  Past Medical History:  Diagnosis Date  . Arthritis    "fingers, some in my knees" (01/28/2016)  . CAD (coronary artery disease), per cath 04/27/13, non obstructive disease, EF 65-70% 05/11/2013  . Carotid disease, bilateral ([place]) 09/2013   Lt carotid 50-69% stentosis, less on rt  . Dysrhythmia   . Fatty liver   . GERD (gastroesophageal reflux disease)   . Glaucoma   . H/O cardiovascular stress test 12/27/2009   negative for ischemia  . Headache    "occasional since eye pressure regulated" (01/28/2016)  . Hyperlipidemia   . Hypertension   .    Marland Kitchen Migraine    "stopped w/laser holes to relieve pressure in my eyes; had them 3-4 times/wk before taht" (01/28/2016)  . Mitral regurgitation,2+ by cath 05/11/2013  . NSTEMI (non-ST elevated myocardial infarction) ([place]) 04/27/2013  . Pain    BOTH KNEES - PT HAS TORN MENISCUS LEFT KNEE  . Paroxysmal atrial fibrillation (HCC)    hx of a fib on ASA  AND ELIQUIS   . Polycythemia vera ([place]) 08/20/2015   JAK-2 positive 07/19/15  . PVD (peripheral vascular disease) with claudication ([place]) 07/2006   previous L ext iliac stent and rt SFA stent, known occluded Lt SFA  . S/P cardiac cath 04/27/13   NON OBSTRUCTIVE DISEASE, 2+ mr, ef 65-70%  . Statin intolerance   . Stroke ([place]) 2006   SWELLING ALL OVER AND RT SIDE OF FACE DRAWN AND SPEECH SLURRED AND NUMBNESS ON RIGHT SIDE-- ALL RESOLVED    Past Surgical History:  Procedure Laterality Date  . CARDIAC CATHETERIZATION  04/27/13   non occlusive disease with mild to mod. calcified lesions in ostial LM and proximal LAD  and moderate prox. RC AND DISTAL AV GROOVE LCX, ef  . CATARACT EXTRACTION W/PHACO Right 03/21/2015   Procedure: CATARACT EXTRACTION PHACO AND INTRAOCULAR LENS PLACEMENT RIGHT EYE;  Surgeon: Baruch Goldmann, MD;  Location: [facility];  Service: Ophthalmology;  Laterality: Right;  CDE:5.10  . CATARACT EXTRACTION W/PHACO Left 05/16/2015   Procedure: CATARACT EXTRACTION PHACO AND INTRAOCULAR LENS PLACEMENT (IOC);  Surgeon: Baruch Goldmann, MD;  Location: [facility];  Service: Ophthalmology;  Laterality: Left;  CDE 5.57  . [place]  . EMBOLECTOMY Right 02/01/2016   Procedure: Thrombectomy of Right Femoral-Popliteal Bypass Graft; Endarterectomy of Right Below Knee Popliteal Artery and Tibial Peroneal Trunk with Bovine Pericardium Patch Angioplasty.;  Surgeon: Angelia Mould, MD;  Location: [facility];  Service: Vascular;  Laterality: Right;  . FEMORAL-POPLITEAL BYPASS GRAFT Right 01/31/2016   Procedure: BYPASS GRAFT RIGHT COMMON  FEMORALTO BELOW KNEE POPLITEAL ARTERY BYPASS GRAFT USING 6MM PROPATEN GORTEX GRAFT;  Surgeon: Mal Misty, MD;  Location: [facility];  Service: Vascular;  Laterality: Right;  . FRACTURE SURGERY    . ILIAC ARTERY STENT  07/2006   external iliac and Rt SFA stent 07/2006 AND THE LEFT WAS IN 2006  . KNEE ARTHROSCOPY WITH MEDIAL MENISECTOMY Left 03/27/2014   Procedure: left knee arthorscopy with medial chondraplasty of the medial femoral and patella, medial microfracture technique of medial femoral condyl;  Surgeon: Tobi Bastos, MD;  Location: [facility];  Service: Orthopedics;  Laterality: Left;  . LEFT HEART CATHETERIZATION WITH CORONARY ANGIOGRAM Right 04/27/2013   Procedure: LEFT HEART CATHETERIZATION WITH CORONARY ANGIOGRAM;  Surgeon: Leonie Man, MD;  Location: [facility];  Service: Cardiovascular;  Laterality: Right;  . LOWER EXTREMITY ANGIOGRAM Right 01/31/2016   Procedure: INTRAOP RIGHT LOWER EXTREMITY ANGIOGRAM;  Surgeon: Mal Misty, MD;  Location: [facility];  Service:  Vascular;  Laterality: Right;  . ORIF WRIST FRACTURE Right 1983  . OVARIAN CYST REMOVAL Right   . PATCH ANGIOPLASTY Right 01/31/2016   Procedure: RIGHT COMMON FEMORAL AND PROFUNDA FEMORIS ENDARECTOMY WITH PATCH ANGIOPLASTY;  Surgeon: Mal Misty, MD;  Location: [facility];  Service: Vascular;  Laterality: Right;  . PERIPHERAL VASCULAR CATHETERIZATION N/A 06/27/2015   Procedure: Lower Extremity Angiography;  Surgeon: Lorretta Harp, MD;  Location: [place];  Service: Cardiovascular;  Laterality: N/A;  . PERIPHERAL VASCULAR CATHETERIZATION Bilateral 01/29/2016   Procedure: Lower Extremity Angiography;  Surgeon: Wellington Hampshire, MD;  Location: [place] [facility];  Service: Cardiovascular;  Laterality: Bilateral;  . PERIPHERAL VASCULAR CATHETERIZATION N/A 01/29/2016   Procedure: Abdominal Aortogram;  Surgeon: Wellington Hampshire, MD;  Location: [facility];  Service: Cardiovascular;  Laterality: N/A;  . REFRACTIVE SURGERY Bilateral    "6 in one eye; 7 in the other; to relieve pressure; not glaucoma" (01/28/2016)  . SFA Right 06/27/2015   overlapping [place]ahn covered stents  . TUBAL LIGATION  1983  . VEIN REPAIR Right 01/31/2016   Procedure: RIGHT GREATER SAPHENOUS VEIN EXAMNED BUT NOT REMOVED;  Surgeon: Mal Misty, MD;  Location: [facility];  Service: Vascular;  Laterality: Right;    Family History  Problem Relation Age of Onset  . CAD Mother   . Lung cancer Mother   . Heart attack Mother   . CAD Father   . Heart disease Father   . Heart attack Father 1    died in hes 40's with heart disease  . Healthy Sister   . Liver cancer Brother   . Healthy Sister   . CAD Brother     has had CABG  . CAD Brother    Social History:  reports that she quit smoking about 3 years ago. Her smoking use included Cigarettes. She has a 45.00 pack-year smoking history. She has never used smokeless tobacco. She reports that she does not drink alcohol or use drugs.  Allergies:  Allergies  Allergen  Reactions  . Prednisone Other (See Comments)    Muscle spasms  . Statins Itching and Other (See Comments)    Simvastatin-caused severe itching Pravastatin-caused lesser tiching One type of statin? Caused stroke in 2006, and other symptoms as result  . Warfarin And Related Other (See Comments)    Caused nose bleeds  . Lactose Intolerance (Gi) Other (See Comments)    Bloating, gas  . Penicillins Hives and Other (See Comments)    Questionable high fever when taken and broke out in whelps.   . Simvastatin Itching and Rash    Also lipitor intolerant  . Atenolol Rash  . Crestor [Rosuvastatin] Itching and Rash  . Demerol [Meperidine] Other (See Comments)    Unknown, can't remember   . Effexor [Venlafaxine Hcl] Other (See Comments)    Doesn't remember   . Inderal [Propranolol] Other (See Comments)    Doesn't remember   . Lipitor [Atorvastatin] Rash  . Lovaza [Omega-3-Acid Ethyl Esters] Other (  See Comments)    Doesn't remember   . Nexium [Esomeprazole Magnesium] Rash  . Percocet [Oxycodone-Acetaminophen] Other (See Comments)    Doesn't remember   . Plavix [Clopidogrel Bisulfate] Rash  . Pravastatin Itching  . Trilipix [Choline Fenofibrate] Other (See Comments)    Doesn't remember     Medications Prior to Admission  Medication Sig Dispense Refill  . aspirin EC 81 MG EC tablet Take 1 tablet (81 mg total) by mouth daily.    . carboxymethylcellulose (REFRESH PLUS) 0.5 % SOLN 1 drop 3 (three) times daily as needed.    . diltiazem (CARDIZEM CD) 180 MG 24 hr capsule Take 180 mg by mouth every morning.    . ezetimibe (ZETIA) 10 MG tablet TAKE ONE TABLET AT BEDTIME 30 tablet 5  . fenofibrate (TRICOR) 145 MG tablet TAKE ONE TABLET AT BEDTIME 30 tablet 5  . flecainide (TAMBOCOR) 50 MG tablet TAKE  (1)  TABLET TWICE A DAY. 60 tablet 4  . lisinopril (PRINIVIL,ZESTRIL) 5 MG tablet Take 5 mg by mouth daily.     Marland Kitchen omeprazole (PRILOSEC) 20 MG capsule Take 20 mg by mouth 2 (two) times daily before a  meal.     . traMADol (ULTRAM) 50 MG tablet Take 1 tablet (50 mg total) by mouth See admin instructions. Currently: taking med between 2-3 times daily for pain 30 tablet 0  . acetaminophen (TYLENOL) 500 MG tablet Take 1,000 mg by mouth every 6 (six) hours as needed for moderate pain.     Marland Kitchen docusate sodium (COLACE) 100 MG capsule Take 200 mg by mouth daily as needed for mild constipation (FOR CONSTIPATION).     Marland Kitchen ELIQUIS 5 MG TABS tablet TAKE ONE TABLET BY MOUTH TWICE A DAY AS DIRECTED 60 tablet 4  . hydrochlorothiazide (HYDRODIURIL) 25 MG tablet Take 25 mg by mouth 2 (two) times daily.     Marland Kitchen oxyCODONE-acetaminophen (PERCOCET/ROXICET) 5-325 MG tablet Take 1-2 tablets by mouth every 4 (four) hours as needed for moderate pain. (Patient not taking: Reported on 05/19/2016) 30 tablet 0  . polyvinyl alcohol (LIQUIFILM TEARS) 1.4 % ophthalmic solution Place 1 drop into both eyes as needed for dry eyes.    . pravastatin (PRAVACHOL) 40 MG tablet TAKE 1 TABLET (40 MG TOTAL) BY MOUTH EVERY EVENING. (Patient not taking: Reported on 05/19/2016) 90 tablet 3    No results found for this or any previous visit (from the past 48 hour(s)). No results found.  ROS  Blood pressure (!) 125/56, pulse 63, temperature 98.1 F (36.7 C), temperature source Oral, resp. rate 11, height 5' 5.5" (1.664 m), weight 137 lb 8 oz (62.4 kg), SpO2 100 %. Physical Exam  Constitutional: She appears well-developed and well-nourished.  HENT:  Mouth/Throat: Oropharynx is clear and moist.  Eyes: Conjunctivae are normal. No scleral icterus.  Neck: No thyromegaly present.  Cardiovascular: Normal rate, regular rhythm and normal heart sounds.   No murmur heard. Respiratory: Effort normal and breath sounds normal.  GI: Soft. She exhibits no distension and no mass. There is no tenderness.  Musculoskeletal: She exhibits no edema.  Lymphadenopathy:    She has no cervical adenopathy.  Neurological: She is alert.  Skin: Skin is warm and dry.      Assessment/Plan History of colonic adenoma. Surveillance colonoscopy.  Hildred Laser, MD 09/11/2016, 2:12 PM

## 2016-09-11 NOTE — Op Note (Signed)
[facility] Patient Name: [name] Procedure Date: 09/11/2016 2:08 PM MRN: [id number] Date of Birth: Feb 15, 1951 Attending MD: Hildred Laser , MD CSN: [id number] Age: 65 Admit Type: Outpatient Procedure:                Colonoscopy/ Incomplete Indications:              High risk colon cancer surveillance: Personal                            history of colonic polyps Providers:                Hildred Laser, MD, Lurline Del, RN, Randa Spike,                            Technician Referring MD:             Dione Housekeeper, MS Medicines:                Fentanyl 100 micrograms IV, Midazolam 8 mg IV Complications:            No immediate complications. Estimated Blood Loss:     Estimated blood loss: none. Procedure:                Pre-Anesthesia Assessment:                           - Prior to the procedure, a History and Physical                            was performed, and patient medications and                            allergies were reviewed. The patient's tolerance of                            previous anesthesia was also reviewed. The risks                            and benefits of the procedure and the sedation                            options and risks were discussed with the patient.                            All questions were answered, and informed consent                            was obtained. Prior Anticoagulants: The patient                            last took aspirin 1 day and Eliquis (apixaban) 1                            day prior to the procedure. ASA Grade Assessment:  III - A patient with severe systemic disease. After                            reviewing the risks and benefits, the patient was                            deemed in satisfactory condition to undergo the                            procedure.                           After obtaining informed consent, the colonoscope                            was passed under  direct vision. Throughout the                            procedure, the patient's blood pressure, pulse, and                            oxygen saturations were monitored continuously. The                            EC-[id number]) scope was introduced through                            the anus with the intention of advancing to the                            ileum. The scope was advanced to the sigmoid colon                            before the procedure was aborted. Medications were                            given. The colonoscopy was aborted due to the                            patient's discomfort during the procedure.                            Increasing the dose of sedation medication,                            changing the patient to a supine position and                            withdrawing and reinserting the scope did not allow                            for the successful completion of the procedure. The  colonoscopy was somewhat difficult due to                            restricted mobility of the colon and the patient's                            discomfort during the procedure. The patient                            tolerated the procedure poorly due to the patient's                            discomfort during the procedure. The quality of the                            bowel preparation was good. The rectum was                            photographed. Scope In: 2:25:05 PM Scope Out: 2:48:55 PM Total Procedure Duration: 0 hours 23 minutes 50 seconds  Findings:      The perianal and digital rectal examinations were normal.      The sigmoid colon appeared normal.      External hemorrhoids were found during retroflexion. The hemorrhoids       were small. Impression:               - The procedure was aborted due to the patient's                            discomfort during the procedure. Patient can never                            be  well sedated.                           - The sigmoid colon is normal.                           - External hemorrhoids.                           - No specimens collected. Moderate Sedation:      Moderate (conscious) sedation was administered by the endoscopy nurse       and supervised by the endoscopist. The following parameters were       monitored: oxygen saturation, heart rate, blood pressure, CO2       capnography and response to care. Total physician intraservice time was       31 minutes. Recommendation:           - Patient has a contact number available for                            emergencies. The signs and symptoms of potential                            delayed complications  were discussed with the                            patient. Return to normal activities tomorrow.                            Written discharge instructions were provided to the                            patient.                           - Resume previous diet today.                           - Continue present medications.                           - Repeat colonoscopy in 3 months under monitored                            anesthesia care or will proceed with a virtual                            colonoscopy.                           - Will discussoptions with patien Procedure Code(s):        --- Professional ---                           [phone], 53, Colonoscopy, flexible; diagnostic,                            including collection of specimen(s) by brushing or                            washing, when performed (separate procedure)                           99152, Moderate sedation services provided by the                            same physician or other qualified health care                            professional performing the diagnostic or                            therapeutic service that the sedation supports,                            requiring the presence of an independent trained                             observer to assist in the monitoring of the  patient's level of consciousness and physiological                            status; initial 15 minutes of intraservice time,                            patient age 10 years or older                           [phone], Moderate sedation services; each additional                            15 minutes intraservice time Diagnosis Code(s):        --- Professional ---                           Z86.010, Personal history of colonic polyps                           Z53.8, Procedure and treatment not carried out for                            other reasons                           K64.4, Residual hemorrhoidal skin tags CPT copyright 2016 [employer]. All rights reserved. The codes documented in this report are preliminary and upon coder review may  be revised to meet current compliance requirements. Hildred Laser, MD Hildred Laser, MD 09/11/2016 3:20:39 PM This report has been signed electronically. Number of Addenda: 0

## 2016-09-21 ENCOUNTER — Encounter (HOSPITAL_COMMUNITY): Payer: Self-pay | Admitting: Internal Medicine

## 2016-09-30 ENCOUNTER — Other Ambulatory Visit (INDEPENDENT_AMBULATORY_CARE_PROVIDER_SITE_OTHER): Payer: Medicare Other

## 2016-09-30 DIAGNOSIS — D45 Polycythemia vera: Secondary | ICD-10-CM | POA: Diagnosis not present

## 2016-10-01 ENCOUNTER — Telehealth: Payer: Self-pay | Admitting: *Deleted

## 2016-10-01 LAB — CBC WITH DIFFERENTIAL/PLATELET
BASOS ABS: 0.2 10*3/uL (ref 0.0–0.2)
Basos: 1 %
EOS (ABSOLUTE): 0.5 10*3/uL — AB (ref 0.0–0.4)
Eos: 3 %
Hematocrit: 36.9 % (ref 34.0–46.6)
Hemoglobin: 11.2 g/dL (ref 11.1–15.9)
IMMATURE GRANS (ABS): 0.5 10*3/uL — AB (ref 0.0–0.1)
Immature Granulocytes: 3 %
LYMPHS: 7 %
Lymphocytes Absolute: 1.3 10*3/uL (ref 0.7–3.1)
MCH: 19.5 pg — AB (ref 26.6–33.0)
MCHC: 30.4 g/dL — ABNORMAL LOW (ref 31.5–35.7)
MCV: 64 fL — AB (ref 79–97)
MONOCYTES: 4 %
Monocytes Absolute: 0.7 10*3/uL (ref 0.1–0.9)
NEUTROS ABS: 15.2 10*3/uL — AB (ref 1.4–7.0)
NRBC: 1 % — AB (ref 0–0)
Neutrophils: 82 %
PLATELETS: 395 10*3/uL — AB (ref 150–379)
RBC: 5.74 x10E6/uL — AB (ref 3.77–5.28)
RDW: 21.1 % — AB (ref 12.3–15.4)
WBC: 18.4 10*3/uL — AB (ref 3.4–10.8)

## 2016-10-01 NOTE — Telephone Encounter (Signed)
-----   Message from Annia Belt, MD sent at 10/01/2016  7:42 AM EDT ----- Call pt: hemoglobin 11; we can continue to watch; hold phlebotomy; keep appt with me coming up soon

## 2016-10-01 NOTE — Telephone Encounter (Signed)
Pt called - no answer; left message "hemoglobin 11.2; we can continue to watch; hold phlebotomy; keep appt with me coming up soon" per Dr Beryle Beams. And to call for any questions.

## 2016-10-07 ENCOUNTER — Other Ambulatory Visit: Payer: Self-pay

## 2016-10-13 ENCOUNTER — Encounter: Payer: Self-pay | Admitting: Oncology

## 2016-10-13 ENCOUNTER — Ambulatory Visit (INDEPENDENT_AMBULATORY_CARE_PROVIDER_SITE_OTHER): Payer: Medicare Other | Admitting: Oncology

## 2016-10-13 VITALS — BP 168/68 | HR 66 | Temp 98.2°F | Ht 65.5 in | Wt 143.1 lb

## 2016-10-13 DIAGNOSIS — I739 Peripheral vascular disease, unspecified: Secondary | ICD-10-CM

## 2016-10-13 DIAGNOSIS — D45 Polycythemia vera: Secondary | ICD-10-CM | POA: Diagnosis not present

## 2016-10-13 DIAGNOSIS — Z885 Allergy status to narcotic agent status: Secondary | ICD-10-CM

## 2016-10-13 DIAGNOSIS — I480 Paroxysmal atrial fibrillation: Secondary | ICD-10-CM | POA: Diagnosis not present

## 2016-10-13 DIAGNOSIS — Z87891 Personal history of nicotine dependence: Secondary | ICD-10-CM

## 2016-10-13 DIAGNOSIS — Z888 Allergy status to other drugs, medicaments and biological substances status: Secondary | ICD-10-CM

## 2016-10-13 DIAGNOSIS — Z91011 Allergy to milk products: Secondary | ICD-10-CM

## 2016-10-13 DIAGNOSIS — Z7901 Long term (current) use of anticoagulants: Secondary | ICD-10-CM

## 2016-10-13 DIAGNOSIS — R161 Splenomegaly, not elsewhere classified: Secondary | ICD-10-CM | POA: Diagnosis not present

## 2016-10-13 DIAGNOSIS — I252 Old myocardial infarction: Secondary | ICD-10-CM

## 2016-10-13 DIAGNOSIS — D72829 Elevated white blood cell count, unspecified: Secondary | ICD-10-CM | POA: Diagnosis not present

## 2016-10-13 DIAGNOSIS — I2510 Atherosclerotic heart disease of native coronary artery without angina pectoris: Secondary | ICD-10-CM

## 2016-10-13 DIAGNOSIS — Z8673 Personal history of transient ischemic attack (TIA), and cerebral infarction without residual deficits: Secondary | ICD-10-CM

## 2016-10-13 DIAGNOSIS — Z95820 Peripheral vascular angioplasty status with implants and grafts: Secondary | ICD-10-CM

## 2016-10-13 DIAGNOSIS — Z880 Allergy status to penicillin: Secondary | ICD-10-CM

## 2016-10-13 NOTE — Progress Notes (Signed)
Hematology and Oncology Follow Up Visit  [name]:[id number] 1951-08-27 65 y.o. 10/13/2016 10:19 AM   Principle Diagnosis: Encounter Diagnosis  Name Primary?  . Polycythemia vera ([place]) Yes  Clinical Summary: Pleasant 65 year old woman I initially evaluated on 07/15/2015 for unexplained leukocytosis. I initially thought we were dealing with an inflammatory process related to a foreign body reaction to multiple vascular stents, however in reviewing her laboratory data, I noted that she had a chronic anemia with hemoglobins running 12 g with MCV 90 until 03/20/2014 Hemoglobin began to rise first to 14.1 with subsequent values in 2016 up to 16 g recorded on 06/27/2015. She had normal white blood counts back in December 2010. Next white count recorded was in May 2014 at 13,300. Subsequent counts were all 10,000 or above with a count on 07/15/2015 at 17,000 with 85% neutrophils. 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 Since this affects stem cells, there can be a panmyelosis with associated elevated white count and platelet count. Her platelet count has been normal. I did not appreciate any organomegaly on my initial exam but on my subsequent exams I was able to palpate an enlarged spleen. discussed the diagnosis of myeloproliferative disorder with her in detail at time of her 08/20/2015 follow-up visit. . Predominant complication would be predisposition towards thrombosis. She was already on aspirin and Xarelto so she is covered in that regard. With respect to thrombotic risk of increased blood viscosity, current guideline recommends keeping hematocrit less than 45%. This should be accomplished very easily in this woman who is peak hematocrit has not exceeded 52%. We discussed a phlebotomy program. I initiated a phlebotomy program removing of 500 mL's of blood monthly 3  with plans to reevaluate after that. I anticipated seeing a rapid fall in her hematocrit and then be able to space out subsequent phlebotomies to an every 3-4 month interval. She had a good understanding of all we discussed. She had her first phlebotomy Monday, September 02, 2015. She had 4 monthly phlebotomies through 12/02/2015. In February 2017, she underwent a vascular bypass procedure on her right lower extremity and lost a significant amount of blood with hemoglobin falling down as low as 6.6 on February 14. She was given temporary iron supplementation. Hemoglobin rose up to 11 g by April and has stayed at 11 g through most recent CBC done on 09/30/2016. She has not required any subsequent phlebotomies.  Interim History:   She is doing well at this time. She has had no interim medical problems. No problems with her right lower extremity since the vascular bypass surgery. No cardiorespiratory symptoms. No GI symptoms. She had a routine colonoscopy on September 22 by Dr. Melony Overly. Procedure had to be stopped because patient was uncomfortable. Sigmoid colon was normal. Small nonbleeding internal hemorrhoids. [name] a mammogram on September 18. Fibroglandular breasts with no dominant mass.   Medications: reviewed. She is taking a nutritional supplement with tumeric and curcumin.  Allergies:  Allergies  Allergen Reactions  . Prednisone Other (See Comments)    Muscle spasms  . Statins Itching and Other (See Comments)    Simvastatin-caused severe itching Pravastatin-caused lesser tiching One type of statin? Caused stroke in 2006, and other symptoms as result  . Warfarin And Related Other (See Comments)    Caused nose bleeds  . Lactose Intolerance (Gi) Other (See Comments)    Bloating, gas  . Penicillins  Hives and Other (See Comments)    Questionable high fever when taken and broke out in whelps.   . Simvastatin Itching and Rash    Also lipitor intolerant  . Atenolol Rash  . Crestor  [Rosuvastatin] Itching and Rash  . Demerol [Meperidine] Other (See Comments)    Unknown, can't remember   . Effexor [Venlafaxine Hcl] Other (See Comments)    Doesn't remember   . Inderal [Propranolol] Other (See Comments)    Doesn't remember   . Lipitor [Atorvastatin] Rash  . Lovaza [Omega-3-Acid Ethyl Esters] Other (See Comments)    Doesn't remember   . Nexium [Esomeprazole Magnesium] Rash  . Percocet [Oxycodone-Acetaminophen] Other (See Comments)    Doesn't remember   . Plavix [Clopidogrel Bisulfate] Rash  . Pravastatin Itching  . Trilipix [Choline Fenofibrate] Other (See Comments)    Doesn't remember     Review of Systems: See interim history Remaining ROS negative:   Physical Exam: Blood pressure (!) 168/68, pulse 66, temperature 98.2 F (36.8 C), temperature source Oral, height 5' 5.5" (1.664 m), weight 143 lb 1.6 oz (64.9 kg), SpO2 100 %. Wt Readings from Last 3 Encounters:  10/13/16 143 lb 1.6 oz (64.9 kg)  09/11/16 137 lb 8 oz (62.4 kg)  05/19/16 137 lb (62.1 kg)     General appearance: petite Caucasian woman HENNT: Pharynx no erythema, exudate, mass, or ulcer. No thyromegaly or thyroid nodules Lymph nodes: No cervical, supraclavicular, or axillary lymphadenopathy Breasts:  Lungs: Clear to auscultation, resonant to percussion throughout Heart: Regular rhythm, no murmur, no gallop, no rub, no click, no edema Abdomen: Soft, nontender, normal bowel sounds, no mass, spleen 7 cm Extremities: No edema, no calf tenderness Musculoskeletal: no joint deformities GU:  Vascular: Carotid pulses 2+, no bruits, distal pulses: Dorsalis pedis 1+ symmetric Neurologic: Alert, oriented, PERRLA, cranial nerves grossly normal, motor strength 5 over 5, reflexes 1+ symmetric, upper body coordination normal, gait normal, Skin: No rash; multiple ecchymoses on skin of her hands and forearms  Lab Results: CBC W/Diff    Component Value Date/Time   WBC 18.4 (H) 09/30/2016 1019   WBC 23.6  (H) 02/18/2016 1342   RBC 5.74 (H) 09/30/2016 1019   RBC 4.82 02/18/2016 1342   HGB 10.0 (L) 02/18/2016 1342   HCT 36.9 09/30/2016 1019   PLT 395 (H) 09/30/2016 1019   MCV 64 (L) 09/30/2016 1019   MCH 19.5 (L) 09/30/2016 1019   MCH 20.7 (L) 02/18/2016 1342   MCHC 30.4 (L) 09/30/2016 1019   MCHC 30.6 02/18/2016 1342   RDW 21.1 (H) 09/30/2016 1019   LYMPHSABS 1.3 09/30/2016 1019   MONOABS 0.5 02/18/2016 1342   EOSABS 0.5 (H) 09/30/2016 1019   BASOSABS 0.2 09/30/2016 1019     Chemistry      Component Value Date/Time   NA 133 (L) 03/03/2016 1531   K 5.1 03/03/2016 1531   CL 97 (L) 03/03/2016 1531   CO2 25 03/03/2016 1531   BUN 33 (H) 03/03/2016 1531   CREATININE 1.33 (H) 03/03/2016 1531      Component Value Date/Time   CALCIUM 9.5 03/03/2016 1531   ALKPHOS 54 01/29/2016 0035   AST 20 01/29/2016 0035   ALT 13 (L) 01/29/2016 0035   BILITOT 0.4 01/29/2016 0035       Radiological Studies: No results found.  Impression:   #1. Polycythemia vera  diagnosed July 2016 On anticoagulation and a phlebotomy program. Phlebotomy currently on hold due to surgical blood loss.I will continue to monitor  his CBCs every 2 months. If hemoglobin goes above 13, resume  #2. Leukocytosis and splenomegaly These are both related to her myeloproliferative disorder.  #3. Chronic anticoagulation due to paroxysmal age of fibrillation and extensive peripheral vascular disease also indicated for her myeloproliferative disorder.  #4. Coronary artery disease with history of a an STEMI May 2014  #5. Paroxysmal atrial fibrillation on treatment outlined above  #6. Cerebrovascular disease with bilateral carotid stenosis  #7. Peripheral vascular disease with claudication status post previous left external iliac stent and right SFA stent. Recent February 2017 right femoral-popliteal bypass.  #8. History of TIA 2006  #9. Hyperlipidemia  #10. Glaucoma.  #11. GERD  #12. Degenerative  arthritis of the knees   CC: Patient Care Team: Dione Housekeeper, MD as PCP - General (Family Medicine) Lorretta Harp, MD as Consulting Physician (Cardiology) Annia Belt, MD as Consulting Physician (Oncology)   Annia Belt, MD 10/24/201710:19 AM

## 2016-10-13 NOTE — Patient Instructions (Signed)
Continue every 2 month CBC standing order; next 12/01/16 Add ferritin level on 12/12 Visit with Dr Darnell Level in 6 months No phlebotomy for now

## 2016-11-18 ENCOUNTER — Encounter: Payer: Self-pay | Admitting: Vascular Surgery

## 2016-11-25 ENCOUNTER — Ambulatory Visit (HOSPITAL_COMMUNITY)
Admission: RE | Admit: 2016-11-25 | Discharge: 2016-11-25 | Disposition: A | Discharge status: Home / Self Care | Payer: Medicare Other | Source: Ambulatory Visit | Attending: Vascular Surgery | Admitting: Vascular Surgery

## 2016-11-25 ENCOUNTER — Ambulatory Visit (INDEPENDENT_AMBULATORY_CARE_PROVIDER_SITE_OTHER): Payer: Medicare Other | Admitting: Vascular Surgery

## 2016-11-25 ENCOUNTER — Encounter: Payer: Self-pay | Admitting: Vascular Surgery

## 2016-11-25 ENCOUNTER — Ambulatory Visit (INDEPENDENT_AMBULATORY_CARE_PROVIDER_SITE_OTHER)
Admission: RE | Admit: 2016-11-25 | Discharge: 2016-11-25 | Disposition: A | Discharge status: Home / Self Care | Payer: Medicare Other | Source: Ambulatory Visit | Attending: Vascular Surgery | Admitting: Vascular Surgery

## 2016-11-25 VITALS — BP 161/81 | HR 77 | Temp 97.1°F | Resp 16 | Ht 65.0 in | Wt 141.0 lb

## 2016-11-25 DIAGNOSIS — Z87891 Personal history of nicotine dependence: Secondary | ICD-10-CM | POA: Diagnosis not present

## 2016-11-25 DIAGNOSIS — I739 Peripheral vascular disease, unspecified: Secondary | ICD-10-CM

## 2016-11-25 DIAGNOSIS — R938 Abnormal findings on diagnostic imaging of other specified body structures: Secondary | ICD-10-CM | POA: Insufficient documentation

## 2016-11-25 DIAGNOSIS — E785 Hyperlipidemia, unspecified: Secondary | ICD-10-CM | POA: Diagnosis not present

## 2016-11-25 DIAGNOSIS — I10 Essential (primary) hypertension: Secondary | ICD-10-CM | POA: Insufficient documentation

## 2016-11-25 NOTE — Progress Notes (Signed)
HISTORY AND PHYSICAL     CC:  Follow up Referring Provider:  Octavio Graves, DO  HPI: This is a 64 y.o. female who is s/p right CFA to below knee popliteal bypass grafting with GoreTex by Dr. Kellie Simmering on 01/31/16 & returned to OR 36hrs later for thrombectomy and extensive endarterectomy of the tibioperoneal trunk with bovine patch angioplasty by Dr. Scot Dock who returns today for 6 month follow up.  She states that she has been doing very well and is performing her ADL's without difficulty.  She continues her walking program.  She denies any stroke symptoms.  She has been taken off of her Eliquis and only takes a daily aspirin.  She has not required anymore phlebotomy for her polycythemia vera but continues to be monitored by Dr. Beryle Beams.   She is not on a statin due to allergy.     Past Medical History:  Diagnosis Date  . Arthritis    "fingers, some in my knees" (01/28/2016)  . CAD (coronary artery disease), per cath 04/27/13, non obstructive disease, EF 65-70% 05/11/2013  . Carotid disease, bilateral ([place]) 09/2013   Lt carotid 50-69% stentosis, less on rt  . Dysrhythmia   . Fatty liver   . GERD (gastroesophageal reflux disease)   . Glaucoma   . H/O cardiovascular stress test 12/27/2009   negative for ischemia  . Headache    "occasional since eye pressure regulated" (01/28/2016)  . Hyperlipidemia   . Hypertension   . Leukocytosis 07/15/2015  . Migraine    "stopped w/laser holes to relieve pressure in my eyes; had them 3-4 times/wk before taht" (01/28/2016)  . Mitral regurgitation,2+ by cath 05/11/2013  . NSTEMI (non-ST elevated myocardial infarction) ([place]) 04/27/2013  . Pain    BOTH KNEES - PT HAS TORN MENISCUS LEFT KNEE  . Paroxysmal atrial fibrillation (HCC)    hx of a fib on ASA  AND ELIQUIS   . Polycythemia vera ([place]) 08/20/2015   JAK-2 positive 07/19/15  . PVD (peripheral vascular disease) with claudication ([place]) 07/2006   previous L ext iliac stent and rt SFA stent, known occluded Lt  SFA  . S/P cardiac cath 04/27/13   NON OBSTRUCTIVE DISEASE, 2+ mr, ef 65-70%  . Statin intolerance   . Stroke ([place]) 2006   SWELLING ALL OVER AND RT SIDE OF FACE DRAWN AND SPEECH SLURRED AND NUMBNESS ON RIGHT SIDE-- ALL RESOLVED    Past Surgical History:  Procedure Laterality Date  . CARDIAC CATHETERIZATION  04/27/13   non occlusive disease with mild to mod. calcified lesions in ostial LM and proximal LAD and moderate prox. RC AND DISTAL AV GROOVE LCX, ef  . CATARACT EXTRACTION W/PHACO Right 03/21/2015   Procedure: CATARACT EXTRACTION PHACO AND INTRAOCULAR LENS PLACEMENT RIGHT EYE;  Surgeon: Baruch Goldmann, MD;  Location: [facility];  Service: Ophthalmology;  Laterality: Right;  CDE:5.10  . CATARACT EXTRACTION W/PHACO Left 05/16/2015   Procedure: CATARACT EXTRACTION PHACO AND INTRAOCULAR LENS PLACEMENT (IOC);  Surgeon: Baruch Goldmann, MD;  Location: [facility];  Service: Ophthalmology;  Laterality: Left;  CDE 5.57  . [place]  . COLONOSCOPY N/A 09/11/2016   Procedure: COLONOSCOPY;  Surgeon: Rogene Houston, MD;  Location: [facility];  Service: Endoscopy;  Laterality: N/A;  730-moved to 1:00 Ann notified pt  . EMBOLECTOMY Right 02/01/2016   Procedure: Thrombectomy of Right Femoral-Popliteal Bypass Graft; Endarterectomy of Right Below Knee Popliteal Artery and Tibial Peroneal Trunk with Bovine Pericardium Patch Angioplasty.;  Surgeon: Angelia Mould,  MD;  Location: [facility];  Service: Vascular;  Laterality: Right;  . FEMORAL-POPLITEAL BYPASS GRAFT Right 01/31/2016   Procedure: BYPASS GRAFT RIGHT COMMON  FEMORALTO BELOW KNEE POPLITEAL ARTERY BYPASS GRAFT USING 6MM PROPATEN GORTEX GRAFT;  Surgeon: Mal Misty, MD;  Location: [place];  Service: Vascular;  Laterality: Right;  . FRACTURE SURGERY    . ILIAC ARTERY STENT  07/2006   external iliac and Rt SFA stent 07/2006 AND THE LEFT WAS IN 2006  . KNEE ARTHROSCOPY WITH MEDIAL MENISECTOMY Left 03/27/2014   Procedure: left knee arthorscopy with medial  chondraplasty of the medial femoral and patella, medial microfracture technique of medial femoral condyl;  Surgeon: Tobi Bastos, MD;  Location: [facility];  Service: Orthopedics;  Laterality: Left;  . LEFT HEART CATHETERIZATION WITH CORONARY ANGIOGRAM Right 04/27/2013   Procedure: LEFT HEART CATHETERIZATION WITH CORONARY ANGIOGRAM;  Surgeon: Leonie Man, MD;  Location: [facility] CATH LAB;  Service: Cardiovascular;  Laterality: Right;  . LOWER EXTREMITY ANGIOGRAM Right 01/31/2016   Procedure: INTRAOP RIGHT LOWER EXTREMITY ANGIOGRAM;  Surgeon: Mal Misty, MD;  Location: [place];  Service: Vascular;  Laterality: Right;  . ORIF WRIST FRACTURE Right 1983  . OVARIAN CYST REMOVAL Right   . PATCH ANGIOPLASTY Right 01/31/2016   Procedure: RIGHT COMMON FEMORAL AND PROFUNDA FEMORIS ENDARECTOMY WITH PATCH ANGIOPLASTY;  Surgeon: Mal Misty, MD;  Location: [place];  Service: Vascular;  Laterality: Right;  . PERIPHERAL VASCULAR CATHETERIZATION N/A 06/27/2015   Procedure: Lower Extremity Angiography;  Surgeon: Lorretta Harp, MD;  Location: [facility];  Service: Cardiovascular;  Laterality: N/A;  . PERIPHERAL VASCULAR CATHETERIZATION Bilateral 01/29/2016   Procedure: Lower Extremity Angiography;  Surgeon: Wellington Hampshire, MD;  Location: [facility];  Service: Cardiovascular;  Laterality: Bilateral;  . PERIPHERAL VASCULAR CATHETERIZATION N/A 01/29/2016   Procedure: Abdominal Aortogram;  Surgeon: Wellington Hampshire, MD;  Location: [place] CV LAB;  Service: Cardiovascular;  Laterality: N/A;  . REFRACTIVE SURGERY Bilateral    "6 in one eye; 7 in the other; to relieve pressure; not glaucoma" (01/28/2016)  . SFA Right 06/27/2015   overlapping Bahn covered stents  . TUBAL LIGATION  1983  . VEIN REPAIR Right 01/31/2016   Procedure: RIGHT GREATER SAPHENOUS VEIN EXAMNED BUT NOT REMOVED;  Surgeon: Mal Misty, MD;  Location: [place];  Service: Vascular;  Laterality: Right;    Allergies  Allergen Reactions    . Clopidogrel Itching  . Oxycodone-Acetaminophen Itching  . Prednisone Other (See Comments)    Muscle spasms  . Statins Itching and Other (See Comments)    Simvastatin-caused severe itching Pravastatin-caused lesser tiching One type of statin? Caused stroke in 2006, and other symptoms as result  . Venlafaxine Itching  . Warfarin And Related Other (See Comments)    Caused nose bleeds  . Esomeprazole Rash  . Lactose Intolerance (Gi) Other (See Comments)    Bloating, gas  . Penicillins Hives and Other (See Comments)    Questionable high fever when taken and broke out in whelps.   . Simvastatin Itching and Rash    Also lipitor intolerant  . Warfarin Other (See Comments)    nosebleeds  . Atenolol Rash  . Crestor [Rosuvastatin] Itching and Rash  . Demerol [Meperidine] Other (See Comments)    Unknown, can't remember   . Effexor [Venlafaxine Hcl] Other (See Comments)    Doesn't remember   . Inderal [Propranolol] Other (See Comments)    Doesn't remember   .  Lipitor [Atorvastatin] Rash  . Lovaza [Omega-3-Acid Ethyl Esters] Other (See Comments)    Doesn't remember   . Nexium [Esomeprazole Magnesium] Rash  . Percocet [Oxycodone-Acetaminophen] Other (See Comments)    Doesn't remember   . Plavix [Clopidogrel Bisulfate] Rash  . Pravastatin Itching  . Trilipix [Choline Fenofibrate] Other (See Comments)    Doesn't remember     Current Outpatient Prescriptions  Medication Sig Dispense Refill  . acetaminophen (TYLENOL) 500 MG tablet Take 1,000 mg by mouth every 6 (six) hours as needed for moderate pain.     Marland Kitchen aspirin EC 81 MG EC tablet Take 1 tablet (81 mg total) by mouth daily. (Patient taking differently: Take 325 mg by mouth daily. )    . carboxymethylcellulose (REFRESH PLUS) 0.5 % SOLN 1 drop 3 (three) times daily as needed.    . diltiazem (CARDIZEM CD) 180 MG 24 hr capsule Take 180 mg by mouth every morning.    . docusate sodium (COLACE) 100 MG capsule Take 200 mg by mouth daily as  needed for mild constipation (FOR CONSTIPATION).     Marland Kitchen ezetimibe (ZETIA) 10 MG tablet TAKE ONE TABLET AT BEDTIME 30 tablet 5  . fenofibrate (TRICOR) 145 MG tablet TAKE ONE TABLET AT BEDTIME 30 tablet 5  . flecainide (TAMBOCOR) 50 MG tablet TAKE  (1)  TABLET TWICE A DAY. 60 tablet 4  . lisinopril (PRINIVIL,ZESTRIL) 5 MG tablet Take 5 mg by mouth daily.     Marland Kitchen omeprazole (PRILOSEC) 20 MG capsule Take 20 mg by mouth 2 (two) times daily before a meal.     . traMADol (ULTRAM) 50 MG tablet Take 1 tablet (50 mg total) by mouth See admin instructions. Currently: taking med between 2-3 times daily for pain 30 tablet 0  . ELIQUIS 5 MG TABS tablet TAKE ONE TABLET BY MOUTH TWICE A DAY AS DIRECTED (Patient not taking: Reported on 11/25/2016) 60 tablet 4  . hydrochlorothiazide (HYDRODIURIL) 25 MG tablet Take 25 mg by mouth 2 (two) times daily.     Marland Kitchen oxyCODONE-acetaminophen (PERCOCET/ROXICET) 5-325 MG tablet Take 1-2 tablets by mouth every 4 (four) hours as needed for moderate pain. (Patient not taking: Reported on 11/25/2016) 30 tablet 0  . polyvinyl alcohol (LIQUIFILM TEARS) 1.4 % ophthalmic solution Place 1 drop into both eyes as needed for dry eyes.    . pravastatin (PRAVACHOL) 40 MG tablet TAKE 1 TABLET (40 MG TOTAL) BY MOUTH EVERY EVENING. (Patient not taking: Reported on 11/25/2016) 90 tablet 3   No current facility-administered medications for this visit.     Family History  Problem Relation Age of Onset  . CAD Mother   . Lung cancer Mother   . Heart attack Mother   . CAD Father   . Heart disease Father   . Heart attack Father 65    died in hes 33's with heart disease  . Healthy Sister   . Liver cancer Brother   . Healthy Sister   . CAD Brother     has had CABG  . CAD Brother     Social History   Social History  . Marital status: Widowed    Spouse name: N/A  . Number of children: 2  . Years of education: 12   Occupational History  . CNA    Social History Main Topics  . Smoking  status: Former Smoker    Packs/day: 1.00    Years: 45.00    Types: Cigarettes    Quit date: 11/11/2012  .  Smokeless tobacco: Never Used     Comment: quit 4 years ago  . Alcohol use No  . Drug use: No  . Sexual activity: Not Currently    Birth control/ protection: None   Other Topics Concern  . Not on file   Social History Narrative  . No narrative on file     REVIEW OF SYSTEMS:   [X]  denotes positive finding, [ ]  denotes negative finding Cardiac  Comments:  Chest pain or chest pressure:    Shortness of breath upon exertion:    Short of breath when lying flat:    Irregular heart rhythm:        Vascular    Pain in calf, thigh, or hip brought on by ambulation:    Pain in feet at night that wakes you up from your sleep:     Blood clot in your veins:    Leg swelling:  x       Pulmonary    Oxygen at home:    Productive cough:     Wheezing:         Neurologic    Sudden weakness in arms or legs:     Sudden numbness in arms or legs:     Sudden onset of difficulty speaking or slurred speech:    Temporary loss of vision in one eye:     Problems with dizziness:         Gastrointestinal    Blood in stool:     Vomited blood:         Genitourinary    Burning when urinating:     Blood in urine:        Psychiatric    Major depression:         Hematologic    Bleeding problems:    Problems with blood clotting too easily:        Skin    Rashes or ulcers:        Constitutional    Fever or chills:      PHYSICAL EXAMINATION:  Vitals:   11/25/16 1440 11/25/16 1447  BP: (!) 157/74 (!) 161/81  Pulse: 77 77  Resp: 16   Temp: 97.1 F (36.2 C)    Body mass index is 23.46 kg/m.  General:  WDWN in NAD; vital signs documented above Gait: Not observed HENT: WNL, normocephalic Pulmonary: normal non-labored breathing , without Rales, rhonchi,  wheezing Cardiac: regular HR, without  Murmurs, rubs or gallops; with left carotid bruit Abdomen: soft, NT, no  masses Skin: without rashes Vascular Exam/Pulses:  Right Left  Radial 2+ (normal) 2+ (normal)  Ulnar 2+ (normal) 2+ (normal)  Femoral 2+ (normal) 2+ (normal)  Popliteal Unable to palpate  Unable to palpate  DP 2+ (normal) 2+ (normal)  PT Unable to palpate Unable to palpate   Extremities: without ischemic changes, without Gangrene , without cellulitis; without open wounds; incisions are healing nicely. Musculoskeletal: no muscle wasting or atrophy  Neurologic: A&O X 3;  No focal weakness or paresthesias are detected Psychiatric:  The pt has Normal affect.   Non-Invasive Vascular Imaging:   ABI's 11/25/16: Right:  1.04 Left:  0.82  Previous ABI's 05/19/16: Right:  0.82 Left:  0.63  Lower extremity arterial duplex evaluation 11/25/16: -patent right CFA to below knee popliteal bypass graft with no evidence for restenosis   (No significant change from prior exam).  Pt meds includes: Statin:  No.  Allergy  Beta Blocker:  No. Aspirin:  Yes.  ACEI:  Yes.   ARB:  No. CCB use:  Yes Other Antiplatelet/Anticoagulant:  No No longer on Eliquis--only aspirin 325mg    ASSESSMENT/PLAN:: 65 y.o. female who is s/p right CFA to below knee popliteal bypass grafting with GoreTex by Dr. Kellie Simmering on 01/31/16 & returned to OR 36hrs later for thrombectomy and extensive endarterectomy of the tibioperoneal trunk with bovine patch angioplasty by Dr. Scot Dock who returns today for 6 month follow up.   -Her graft is patent and she has palpable DP pulses bilaterally.  She will f/u in 6 months with Dr. Scot Dock with arterial duplex of the RLE graft.  Dr. [name] will continue to monitor ABI's.  -she does have occasional swelling in the right leg after being ambulatory for a long period of time at which time she ices and elevates and this improves. -Left carotid bruit-recent carotid duplex studies by Dr. [name] reveal [id number] LICA stenosis and [id number] RICA stenosis with patent vertebral and antegrade flow.  Dr. [name]  will continue to follow this.  The pt has been asymptomatic and understands that if she develops any symptoms to call 911 or go to the ER.   -since her surgery, she has been taken off of Eliquis and is on an adult aspirin daily  -she has not required any further phlebotomy for her polycythemia vera, which is followed by Dr. Beryle Beams.  -f/u in 6 months with arterial duplex RLE bypass.   Leontine Locket, PA-C [facility] [phone]  Clinic MD:  Pt seen and examined in conjunction with Dr. Scot Dock

## 2016-11-25 NOTE — Progress Notes (Signed)
Vitals:   11/25/16 1440  BP: (!) 157/74  Pulse: 77  Resp: 16  Temp: 97.1 F (36.2 C)  SpO2: 97%  Weight: 141 lb (64 kg)  Height: 5\' 5"  (1.651 m)

## 2016-11-26 NOTE — Addendum Note (Signed)
Addended by: Lianne Cure A on: 11/26/2016 10:12 AM   Modules accepted: Orders

## 2016-12-16 ENCOUNTER — Other Ambulatory Visit: Payer: Self-pay | Admitting: Cardiovascular Disease

## 2016-12-25 ENCOUNTER — Telehealth (INDEPENDENT_AMBULATORY_CARE_PROVIDER_SITE_OTHER): Payer: Self-pay | Admitting: *Deleted

## 2016-12-25 NOTE — Telephone Encounter (Signed)
Patient on recall, TCS op note says repeat TCS or virtual TCS in 3 mths, which does patient need, please advise

## 2017-01-05 NOTE — Telephone Encounter (Signed)
Talked with patient. Will proceed with virtual colonoscopy in February 2018.

## 2017-01-09 ENCOUNTER — Emergency Department (HOSPITAL_COMMUNITY): Payer: Medicare Other | Admitting: Anesthesiology

## 2017-01-09 ENCOUNTER — Inpatient Hospital Stay (HOSPITAL_COMMUNITY): Payer: Medicare Other

## 2017-01-09 ENCOUNTER — Encounter (HOSPITAL_COMMUNITY): Payer: Self-pay | Admitting: Emergency Medicine

## 2017-01-09 ENCOUNTER — Encounter (HOSPITAL_COMMUNITY)
Admission: EM | Disposition: A | Discharge status: Home Health | Payer: Self-pay | Source: Home / Self Care | Attending: Vascular Surgery

## 2017-01-09 ENCOUNTER — Inpatient Hospital Stay (HOSPITAL_COMMUNITY)
Admission: EM | Admit: 2017-01-09 | Discharge: 2017-01-12 | DRG: 253 | Disposition: A | Discharge status: Home Health | Payer: Medicare Other | Attending: Vascular Surgery | Admitting: Vascular Surgery

## 2017-01-09 DIAGNOSIS — Z7982 Long term (current) use of aspirin: Secondary | ICD-10-CM

## 2017-01-09 DIAGNOSIS — E7800 Pure hypercholesterolemia, unspecified: Secondary | ICD-10-CM | POA: Diagnosis present

## 2017-01-09 DIAGNOSIS — H409 Unspecified glaucoma: Secondary | ICD-10-CM | POA: Diagnosis present

## 2017-01-09 DIAGNOSIS — R1110 Vomiting, unspecified: Secondary | ICD-10-CM | POA: Diagnosis not present

## 2017-01-09 DIAGNOSIS — Z888 Allergy status to other drugs, medicaments and biological substances status: Secondary | ICD-10-CM

## 2017-01-09 DIAGNOSIS — K219 Gastro-esophageal reflux disease without esophagitis: Secondary | ICD-10-CM | POA: Diagnosis present

## 2017-01-09 DIAGNOSIS — Z880 Allergy status to penicillin: Secondary | ICD-10-CM

## 2017-01-09 DIAGNOSIS — T82868A Thrombosis of vascular prosthetic devices, implants and grafts, initial encounter: Principal | ICD-10-CM | POA: Diagnosis present

## 2017-01-09 DIAGNOSIS — I998 Other disorder of circulatory system: Secondary | ICD-10-CM | POA: Diagnosis present

## 2017-01-09 DIAGNOSIS — Y92009 Unspecified place in unspecified non-institutional (private) residence as the place of occurrence of the external cause: Secondary | ICD-10-CM

## 2017-01-09 DIAGNOSIS — Z8673 Personal history of transient ischemic attack (TIA), and cerebral infarction without residual deficits: Secondary | ICD-10-CM

## 2017-01-09 DIAGNOSIS — Z801 Family history of malignant neoplasm of trachea, bronchus and lung: Secondary | ICD-10-CM

## 2017-01-09 DIAGNOSIS — Z419 Encounter for procedure for purposes other than remedying health state, unspecified: Secondary | ICD-10-CM

## 2017-01-09 DIAGNOSIS — M1990 Unspecified osteoarthritis, unspecified site: Secondary | ICD-10-CM | POA: Diagnosis present

## 2017-01-09 DIAGNOSIS — Z8249 Family history of ischemic heart disease and other diseases of the circulatory system: Secondary | ICD-10-CM

## 2017-01-09 DIAGNOSIS — I743 Embolism and thrombosis of arteries of the lower extremities: Secondary | ICD-10-CM | POA: Diagnosis present

## 2017-01-09 DIAGNOSIS — D62 Acute posthemorrhagic anemia: Secondary | ICD-10-CM | POA: Diagnosis not present

## 2017-01-09 DIAGNOSIS — I252 Old myocardial infarction: Secondary | ICD-10-CM

## 2017-01-09 DIAGNOSIS — I749 Embolism and thrombosis of unspecified artery: Secondary | ICD-10-CM | POA: Diagnosis present

## 2017-01-09 DIAGNOSIS — Z7901 Long term (current) use of anticoagulants: Secondary | ICD-10-CM

## 2017-01-09 DIAGNOSIS — I10 Essential (primary) hypertension: Secondary | ICD-10-CM | POA: Diagnosis present

## 2017-01-09 DIAGNOSIS — R0789 Other chest pain: Secondary | ICD-10-CM | POA: Diagnosis not present

## 2017-01-09 DIAGNOSIS — Z885 Allergy status to narcotic agent status: Secondary | ICD-10-CM | POA: Diagnosis not present

## 2017-01-09 DIAGNOSIS — I7090 Unspecified atherosclerosis: Secondary | ICD-10-CM

## 2017-01-09 DIAGNOSIS — I480 Paroxysmal atrial fibrillation: Secondary | ICD-10-CM | POA: Diagnosis present

## 2017-01-09 DIAGNOSIS — I2510 Atherosclerotic heart disease of native coronary artery without angina pectoris: Secondary | ICD-10-CM | POA: Diagnosis present

## 2017-01-09 DIAGNOSIS — Z87891 Personal history of nicotine dependence: Secondary | ICD-10-CM

## 2017-01-09 DIAGNOSIS — S8011XA Contusion of right lower leg, initial encounter: Secondary | ICD-10-CM | POA: Diagnosis not present

## 2017-01-09 DIAGNOSIS — D45 Polycythemia vera: Secondary | ICD-10-CM | POA: Diagnosis present

## 2017-01-09 DIAGNOSIS — T82898A Other specified complication of vascular prosthetic devices, implants and grafts, initial encounter: Secondary | ICD-10-CM

## 2017-01-09 DIAGNOSIS — R2689 Other abnormalities of gait and mobility: Secondary | ICD-10-CM

## 2017-01-09 DIAGNOSIS — I6523 Occlusion and stenosis of bilateral carotid arteries: Secondary | ICD-10-CM | POA: Diagnosis present

## 2017-01-09 DIAGNOSIS — Z800 Family history of malignant neoplasm of digestive organs: Secondary | ICD-10-CM

## 2017-01-09 HISTORY — PX: PATCH ANGIOPLASTY: SHX6230

## 2017-01-09 HISTORY — PX: FEMORAL-POPLITEAL BYPASS GRAFT: SHX937

## 2017-01-09 LAB — CBC WITH DIFFERENTIAL/PLATELET
BASOS ABS: 0.3 10*3/uL — AB (ref 0.0–0.1)
Basophils Relative: 1 %
EOS ABS: 0.3 10*3/uL (ref 0.0–0.7)
Eosinophils Relative: 1 %
HCT: 39.6 % (ref 36.0–46.0)
HEMOGLOBIN: 11.5 g/dL — AB (ref 12.0–15.0)
LYMPHS ABS: 1.1 10*3/uL (ref 0.7–4.0)
LYMPHS PCT: 4 %
MCH: 19.2 pg — ABNORMAL LOW (ref 26.0–34.0)
MCHC: 29 g/dL — AB (ref 30.0–36.0)
MCV: 66 fL — ABNORMAL LOW (ref 78.0–100.0)
MONOS PCT: 3 %
Monocytes Absolute: 0.8 10*3/uL (ref 0.1–1.0)
Neutro Abs: 24.4 10*3/uL — ABNORMAL HIGH (ref 1.7–7.7)
Neutrophils Relative %: 91 %
Platelets: 508 10*3/uL — ABNORMAL HIGH (ref 150–400)
RBC: 6 MIL/uL — AB (ref 3.87–5.11)
RDW: 22.2 % — ABNORMAL HIGH (ref 11.5–15.5)
WBC: 26.9 10*3/uL — AB (ref 4.0–10.5)

## 2017-01-09 LAB — BASIC METABOLIC PANEL
ANION GAP: 7 (ref 5–15)
Anion gap: 11 (ref 5–15)
BUN: 22 mg/dL — ABNORMAL HIGH (ref 6–20)
BUN: 15 mg/dL (ref 6–20)
CALCIUM: 8.4 mg/dL — AB (ref 8.9–10.3)
CHLORIDE: 105 mmol/L (ref 101–111)
CO2: 22 mmol/L (ref 22–32)
CO2: 23 mmol/L (ref 22–32)
CREATININE: 0.92 mg/dL (ref 0.44–1.00)
Calcium: 9.2 mg/dL (ref 8.9–10.3)
Chloride: 105 mmol/L (ref 101–111)
Creatinine, Ser: 0.81 mg/dL (ref 0.44–1.00)
GFR calc Af Amer: >60 mL/min (ref >60)
Glucose, Bld: 114 mg/dL — ABNORMAL HIGH (ref 65–99)
Glucose, Bld: 119 mg/dL — ABNORMAL HIGH (ref 65–99)
Potassium: 4.4 mmol/L (ref 3.5–5.1)
Potassium: 4.5 mmol/L (ref 3.5–5.1)
SODIUM: 138 mmol/L (ref 135–145)
Sodium: 135 mmol/L (ref 135–145)

## 2017-01-09 LAB — TROPONIN I: Troponin I: 0.03 ng/mL (ref <0.03)

## 2017-01-09 LAB — PROTIME-INR
INR: 1.07
Prothrombin Time: 14 seconds (ref 11.4–15.2)

## 2017-01-09 LAB — HEPARIN LEVEL (UNFRACTIONATED): Heparin Unfractionated: 0.3 IU/mL (ref 0.30–0.70)

## 2017-01-09 LAB — I-STAT CG4 LACTIC ACID, ED: LACTIC ACID, VENOUS: 1.83 mmol/L (ref 0.5–1.9)

## 2017-01-09 LAB — MRSA PCR SCREENING: MRSA by PCR: NEGATIVE

## 2017-01-09 IMAGING — CR DG ANG/EXT/UNI/OR RIGHT
1 series · 1 of 1 positions shown · non-contrast
Comparison: [DATE]

CLINICAL DATA: Angiogram

EXAM:
RIGHT LORENZ/EXT/UNI/ OR

[AP]
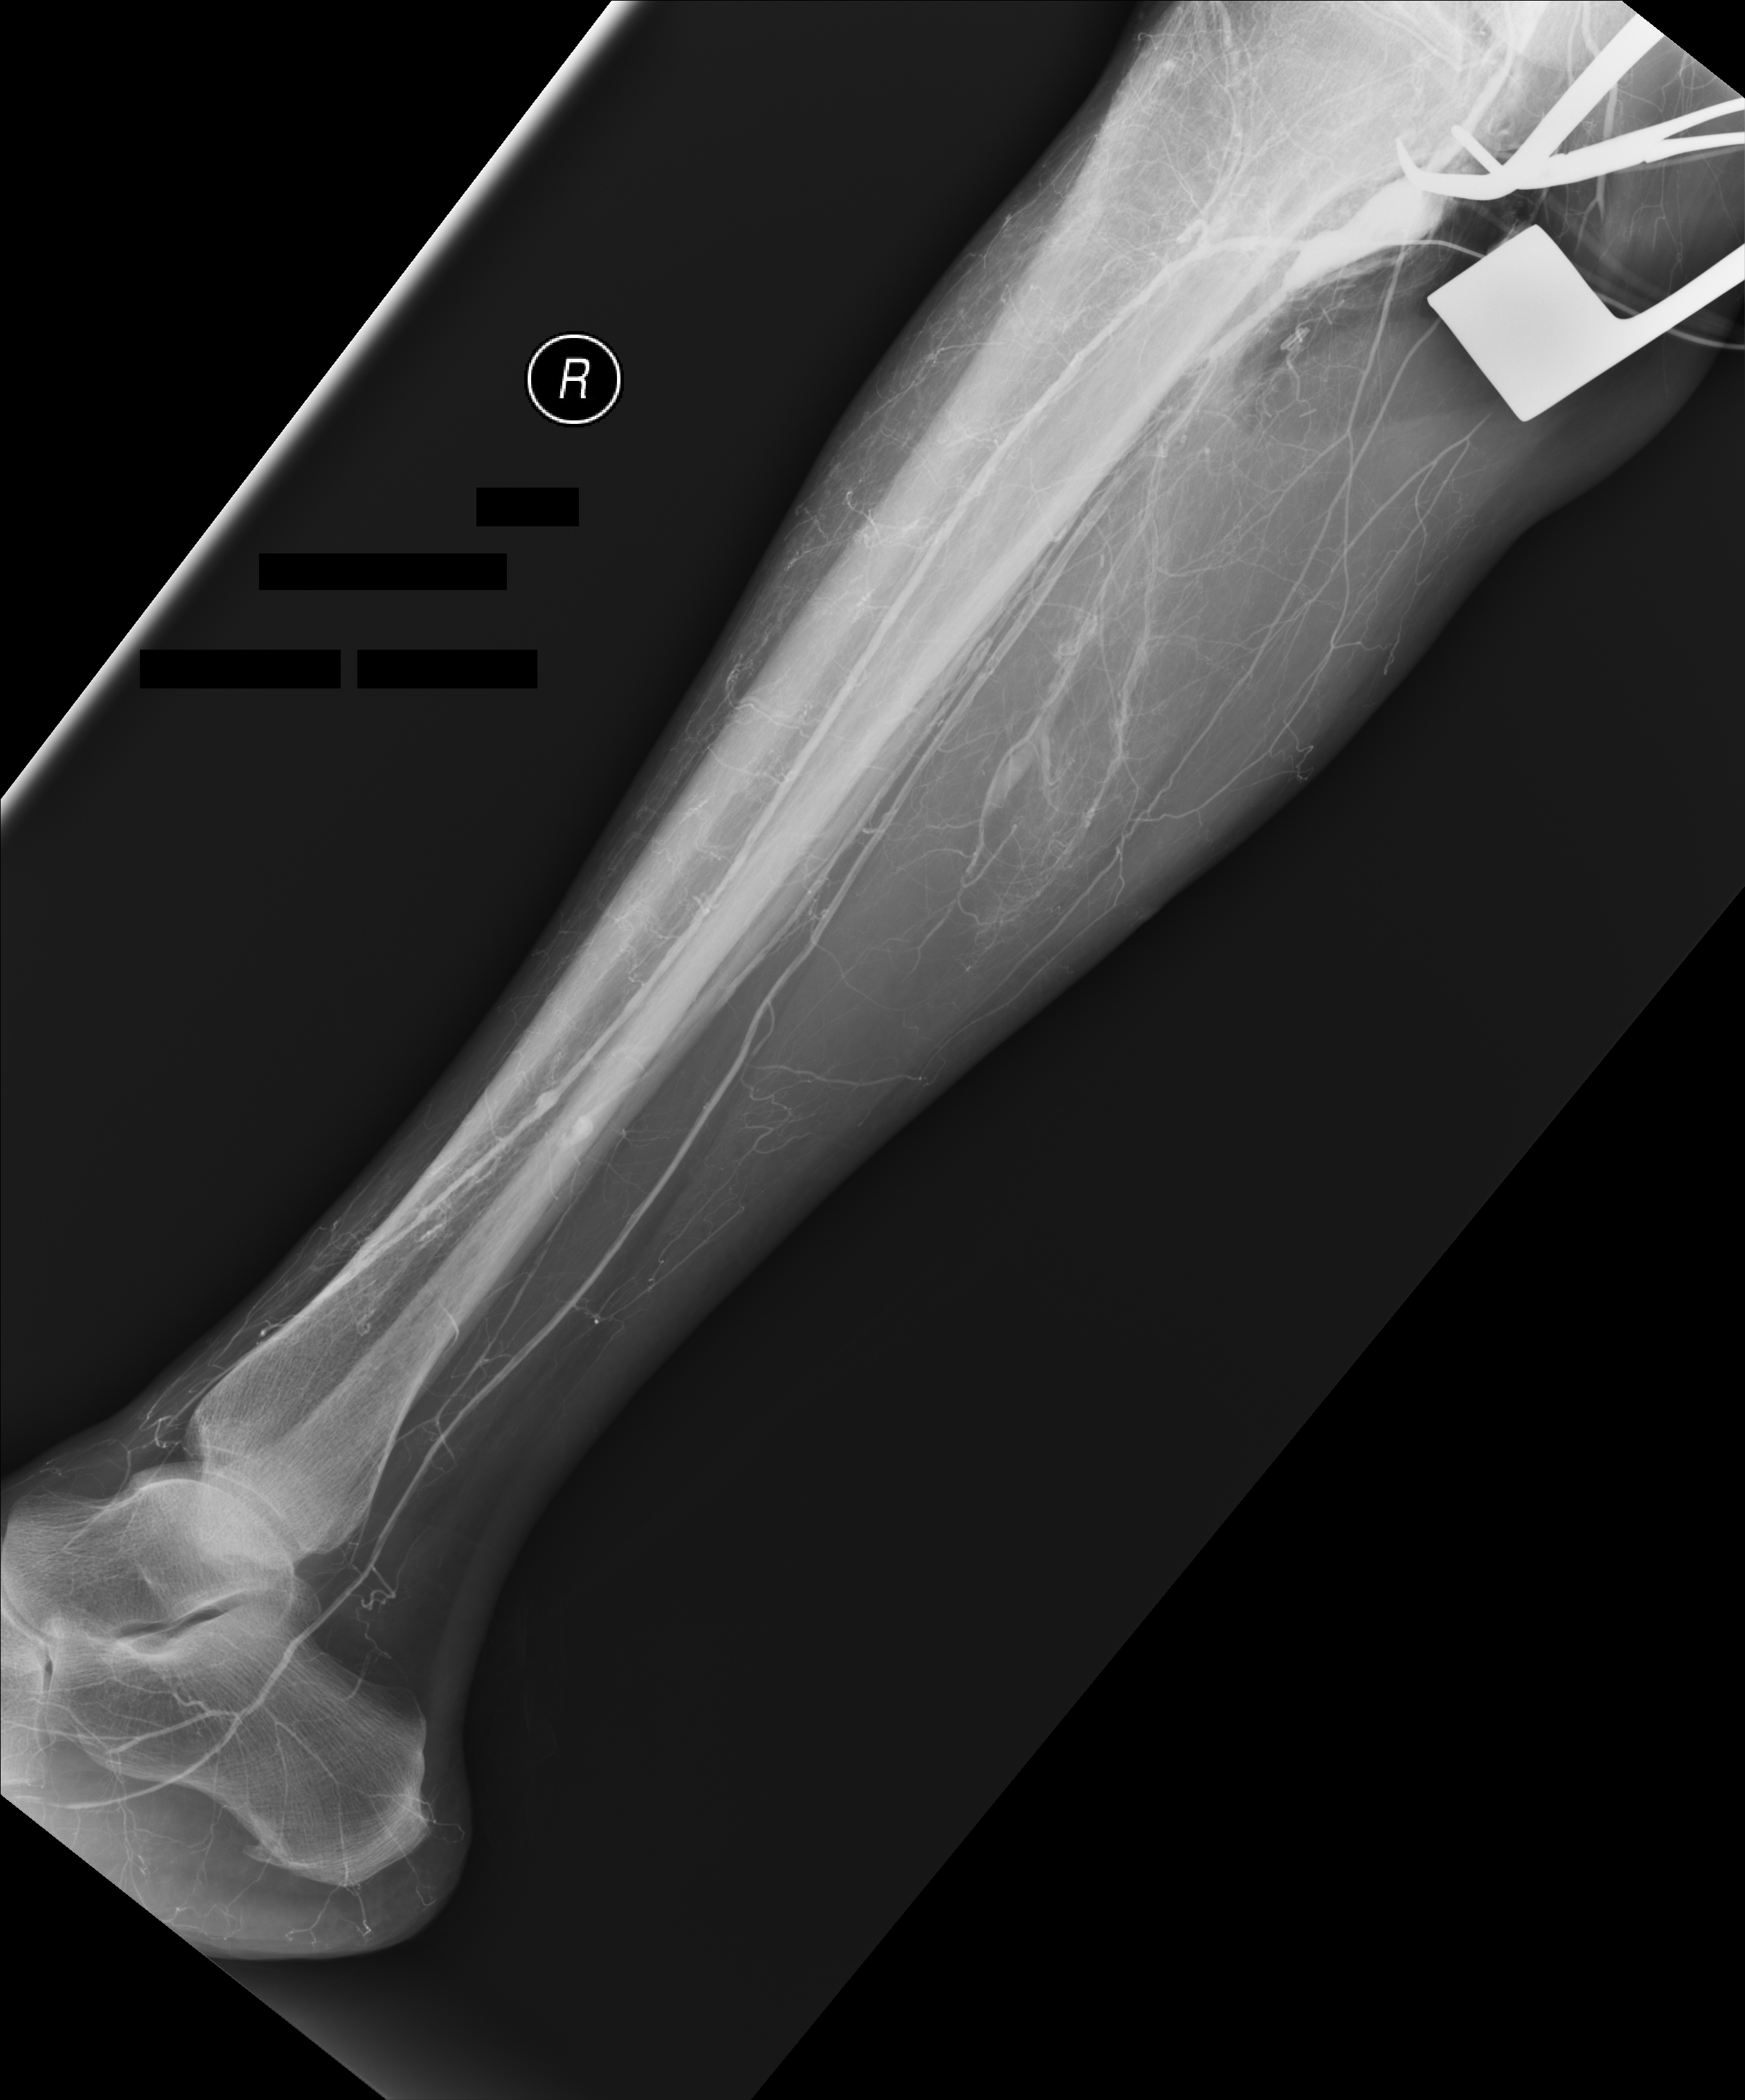

[1 of 1 positions shown; findings below may reference images not displayed]

FINDINGS: Contrast fills the anterior tibial artery, popliteal artery,
tibioperoneal trunk, and posterior tibial artery. The peroneal
artery intermittently opacifies. The distal popliteal artery is
bulbous.
IMPRESSION: Intraoperative angiogram as described.

## 2017-01-09 SURGERY — BYPASS GRAFT FEMORAL-POPLITEAL ARTERY
Anesthesia: General | Site: Leg Lower | Laterality: Right

## 2017-01-09 MED ORDER — ONDANSETRON HCL 4 MG/2ML IJ SOLN
INTRAMUSCULAR | Status: AC
  Filled 2017-01-09: qty 2

## 2017-01-09 MED ORDER — DOCUSATE SODIUM 100 MG PO CAPS
200.0000 mg | ORAL_CAPSULE | Freq: Every evening | ORAL | Status: DC
  Administered 2017-01-10 – 2017-01-11 (×2): 200 mg via ORAL
  Filled 2017-01-09 (×3): qty 2

## 2017-01-09 MED ORDER — ALBUMIN HUMAN 5 % IV SOLN
INTRAVENOUS | Status: DC | PRN
  Administered 2017-01-09: 10:00:00 via INTRAVENOUS

## 2017-01-09 MED ORDER — TRAMADOL HCL 50 MG PO TABS
100.0000 mg | ORAL_TABLET | Freq: Four times a day (QID) | ORAL | Status: DC | PRN
  Administered 2017-01-09 – 2017-01-11 (×8): 100 mg via ORAL
  Filled 2017-01-09 (×9): qty 2

## 2017-01-09 MED ORDER — PHENOL 1.4 % MT LIQD: 1.0000 | OROMUCOSAL | Status: DC | PRN

## 2017-01-09 MED ORDER — THROMBIN 20000 UNITS EX SOLR
CUTANEOUS | Status: AC
  Filled 2017-01-09: qty 20000

## 2017-01-09 MED ORDER — PROTAMINE SULFATE 10 MG/ML IV SOLN
INTRAVENOUS | Status: AC
  Filled 2017-01-09: qty 5

## 2017-01-09 MED ORDER — LIDOCAINE 2% (20 MG/ML) 5 ML SYRINGE
INTRAMUSCULAR | Status: AC
  Filled 2017-01-09: qty 5

## 2017-01-09 MED ORDER — SUCCINYLCHOLINE CHLORIDE 200 MG/10ML IV SOSY
PREFILLED_SYRINGE | INTRAVENOUS | Status: AC
  Filled 2017-01-09: qty 10

## 2017-01-09 MED ORDER — ALUM & MAG HYDROXIDE-SIMETH 200-200-20 MG/5ML PO SUSP: 15.0000 mL | ORAL | Status: DC | PRN

## 2017-01-09 MED ORDER — POLYVINYL ALCOHOL 1.4 % OP SOLN: 1.0000 [drp] | OPHTHALMIC | Status: DC | PRN

## 2017-01-09 MED ORDER — PROPOFOL 10 MG/ML IV BOLUS
INTRAVENOUS | Status: DC | PRN
  Administered 2017-01-09: 130 mg via INTRAVENOUS
  Administered 2017-01-09: 10 mg via INTRAVENOUS

## 2017-01-09 MED ORDER — HEMOSTATIC AGENTS (NO CHARGE) OPTIME
TOPICAL | Status: DC | PRN
  Administered 2017-01-09: 1 via TOPICAL

## 2017-01-09 MED ORDER — HEPARIN SODIUM (PORCINE) 1000 UNIT/ML IJ SOLN
INTRAMUSCULAR | Status: DC | PRN
  Administered 2017-01-09: 7000 [IU] via INTRAVENOUS

## 2017-01-09 MED ORDER — PROMETHAZINE HCL 25 MG/ML IJ SOLN: 6.2500 mg | INTRAMUSCULAR | Status: DC | PRN

## 2017-01-09 MED ORDER — DILTIAZEM HCL ER COATED BEADS 180 MG PO CP24
180.0000 mg | ORAL_CAPSULE | Freq: Every morning | ORAL | Status: DC
  Administered 2017-01-09 – 2017-01-12 (×4): 180 mg via ORAL
  Filled 2017-01-09 (×4): qty 1

## 2017-01-09 MED ORDER — MORPHINE SULFATE (PF) 2 MG/ML IV SOLN
2.0000 mg | INTRAVENOUS | Status: DC | PRN
  Administered 2017-01-09: 2 mg via INTRAVENOUS
  Filled 2017-01-09: qty 1

## 2017-01-09 MED ORDER — CARBOXYMETHYLCELLULOSE SODIUM 0.5 % OP SOLN: 1.0000 [drp] | Freq: Three times a day (TID) | OPHTHALMIC | Status: DC | PRN

## 2017-01-09 MED ORDER — SUFENTANIL CITRATE 50 MCG/ML IV SOLN
INTRAVENOUS | Status: AC
  Filled 2017-01-09: qty 1

## 2017-01-09 MED ORDER — MAGNESIUM SULFATE 2 GM/50ML IV SOLN
2.0000 g | Freq: Every day | INTRAVENOUS | Status: DC | PRN
  Filled 2017-01-09: qty 50

## 2017-01-09 MED ORDER — PROPOFOL 10 MG/ML IV BOLUS
INTRAVENOUS | Status: AC
  Filled 2017-01-09: qty 20

## 2017-01-09 MED ORDER — PANTOPRAZOLE SODIUM 40 MG PO TBEC
40.0000 mg | DELAYED_RELEASE_TABLET | Freq: Every day | ORAL | Status: DC
  Administered 2017-01-10 – 2017-01-12 (×3): 40 mg via ORAL
  Filled 2017-01-09 (×3): qty 1

## 2017-01-09 MED ORDER — PHENYLEPHRINE HCL 10 MG/ML IJ SOLN
INTRAMUSCULAR | Status: DC | PRN
  Administered 2017-01-09 (×4): 40 ug via INTRAVENOUS
  Administered 2017-01-09: 20 ug via INTRAVENOUS
  Administered 2017-01-09: 40 ug via INTRAVENOUS
  Administered 2017-01-09: 20 ug via INTRAVENOUS
  Administered 2017-01-09: 40 ug via INTRAVENOUS
  Administered 2017-01-09: 80 ug via INTRAVENOUS

## 2017-01-09 MED ORDER — LISINOPRIL 10 MG PO TABS
20.0000 mg | ORAL_TABLET | Freq: Every day | ORAL | Status: DC
  Administered 2017-01-09 – 2017-01-12 (×4): 20 mg via ORAL
  Filled 2017-01-09: qty 1
  Filled 2017-01-09 (×2): qty 2
  Filled 2017-01-09: qty 1

## 2017-01-09 MED ORDER — POTASSIUM CHLORIDE CRYS ER 20 MEQ PO TBCR: 20.0000 meq | EXTENDED_RELEASE_TABLET | Freq: Every day | ORAL | Status: DC | PRN

## 2017-01-09 MED ORDER — VANCOMYCIN HCL IN DEXTROSE 1-5 GM/200ML-% IV SOLN
1000.0000 mg | Freq: Two times a day (BID) | INTRAVENOUS | Status: AC
  Administered 2017-01-09 – 2017-01-10 (×2): 1000 mg via INTRAVENOUS
  Filled 2017-01-09 (×2): qty 200

## 2017-01-09 MED ORDER — IOPAMIDOL (ISOVUE-300) INJECTION 61%
INTRAVENOUS | Status: DC | PRN
  Administered 2017-01-09: 28 mL via INTRA_ARTERIAL

## 2017-01-09 MED ORDER — HEPARIN BOLUS VIA INFUSION
4000.0000 [IU] | Freq: Once | INTRAVENOUS | Status: AC
  Administered 2017-01-09: 4000 [IU] via INTRAVENOUS
  Filled 2017-01-09: qty 4000

## 2017-01-09 MED ORDER — PHENYLEPHRINE HCL 10 MG/ML IJ SOLN
INTRAMUSCULAR | Status: DC | PRN
  Administered 2017-01-09: 25 ug/min via INTRAVENOUS

## 2017-01-09 MED ORDER — LACTATED RINGERS IV SOLN
INTRAVENOUS | Status: DC | PRN
  Administered 2017-01-09 (×2): via INTRAVENOUS

## 2017-01-09 MED ORDER — SODIUM CHLORIDE 0.9 % IV SOLN: 500.0000 mL | Freq: Once | INTRAVENOUS | Status: DC | PRN

## 2017-01-09 MED ORDER — SODIUM CHLORIDE 0.9 % IJ SOLN
INTRAMUSCULAR | Status: AC
  Filled 2017-01-09: qty 10

## 2017-01-09 MED ORDER — LIDOCAINE HCL (CARDIAC) 20 MG/ML IV SOLN
INTRAVENOUS | Status: DC | PRN
  Administered 2017-01-09: 60 mg via INTRATRACHEAL

## 2017-01-09 MED ORDER — HYDROMORPHONE HCL 2 MG/ML IJ SOLN
1.0000 mg | Freq: Once | INTRAMUSCULAR | Status: AC
  Administered 2017-01-09: 1 mg via INTRAVENOUS
  Filled 2017-01-09: qty 1

## 2017-01-09 MED ORDER — ONDANSETRON HCL 4 MG/2ML IJ SOLN
INTRAMUSCULAR | Status: DC | PRN
Start: 2017-01-09 — End: 2017-01-09
  Administered 2017-01-09 (×2): 4 mg via INTRAVENOUS

## 2017-01-09 MED ORDER — EZETIMIBE 10 MG PO TABS
10.0000 mg | ORAL_TABLET | Freq: Every day | ORAL | Status: DC
Start: 2017-01-09 — End: 2017-01-12
  Administered 2017-01-09 – 2017-01-11 (×3): 10 mg via ORAL
  Filled 2017-01-09 (×3): qty 1

## 2017-01-09 MED ORDER — ONDANSETRON HCL 4 MG/2ML IJ SOLN
INTRAMUSCULAR | Status: AC
  Filled 2017-01-09: qty 4

## 2017-01-09 MED ORDER — ROCURONIUM BROMIDE 50 MG/5ML IV SOSY
PREFILLED_SYRINGE | INTRAVENOUS | Status: AC
  Filled 2017-01-09: qty 5

## 2017-01-09 MED ORDER — SODIUM CHLORIDE 0.9 % IV SOLN
INTRAVENOUS | Status: DC | PRN
  Administered 2017-01-09: 09:00:00 500 mL

## 2017-01-09 MED ORDER — ACETAMINOPHEN 325 MG PO TABS
325.0000 mg | ORAL_TABLET | ORAL | Status: DC | PRN
  Administered 2017-01-10 – 2017-01-12 (×4): 650 mg via ORAL
  Filled 2017-01-09 (×4): qty 2

## 2017-01-09 MED ORDER — FENTANYL CITRATE (PF) 100 MCG/2ML IJ SOLN: 25.0000 ug | INTRAMUSCULAR | Status: DC | PRN

## 2017-01-09 MED ORDER — DEXTROSE 5 % IV SOLN
INTRAVENOUS | Status: DC | PRN
  Administered 2017-01-09: 1.5 g via INTRAVENOUS

## 2017-01-09 MED ORDER — DEXTROSE 5 % IV SOLN
INTRAVENOUS | Status: AC
  Filled 2017-01-09: qty 1.5

## 2017-01-09 MED ORDER — DEXAMETHASONE SODIUM PHOSPHATE 10 MG/ML IJ SOLN
INTRAMUSCULAR | Status: AC
  Filled 2017-01-09: qty 1

## 2017-01-09 MED ORDER — FLECAINIDE ACETATE 50 MG PO TABS
50.0000 mg | ORAL_TABLET | Freq: Two times a day (BID) | ORAL | Status: DC
  Administered 2017-01-09 – 2017-01-12 (×6): 50 mg via ORAL
  Filled 2017-01-09 (×8): qty 1

## 2017-01-09 MED ORDER — LACTATED RINGERS IV SOLN: INTRAVENOUS | Status: DC

## 2017-01-09 MED ORDER — HEPARIN SODIUM (PORCINE) 1000 UNIT/ML IJ SOLN
INTRAMUSCULAR | Status: AC
  Filled 2017-01-09: qty 1

## 2017-01-09 MED ORDER — ACETAMINOPHEN 650 MG RE SUPP: 325.0000 mg | RECTAL | Status: DC | PRN

## 2017-01-09 MED ORDER — SODIUM CHLORIDE 0.9 % IV BOLUS (SEPSIS)
1000.0000 mL | Freq: Once | INTRAVENOUS | Status: AC
  Administered 2017-01-09: 1000 mL via INTRAVENOUS

## 2017-01-09 MED ORDER — FENTANYL CITRATE (PF) 250 MCG/5ML IJ SOLN: INTRAMUSCULAR | Status: DC | PRN

## 2017-01-09 MED ORDER — TRAMADOL HCL 50 MG PO TABS: 50.0000 mg | ORAL_TABLET | Freq: Four times a day (QID) | ORAL | Status: DC | PRN

## 2017-01-09 MED ORDER — 0.9 % SODIUM CHLORIDE (POUR BTL) OPTIME
TOPICAL | Status: DC | PRN
  Administered 2017-01-09: 1000 mL

## 2017-01-09 MED ORDER — MIDAZOLAM HCL 2 MG/2ML IJ SOLN
INTRAMUSCULAR | Status: AC
  Filled 2017-01-09: qty 2

## 2017-01-09 MED ORDER — SUGAMMADEX SODIUM 200 MG/2ML IV SOLN
INTRAVENOUS | Status: AC
  Filled 2017-01-09: qty 4

## 2017-01-09 MED ORDER — ONDANSETRON HCL 4 MG/2ML IJ SOLN
4.0000 mg | Freq: Four times a day (QID) | INTRAMUSCULAR | Status: DC | PRN
  Administered 2017-01-09: 4 mg via INTRAVENOUS

## 2017-01-09 MED ORDER — EPHEDRINE 5 MG/ML INJ
INTRAVENOUS | Status: AC
  Filled 2017-01-09: qty 20

## 2017-01-09 MED ORDER — IOPAMIDOL (ISOVUE-300) INJECTION 61%
INTRAVENOUS | Status: AC
  Filled 2017-01-09: qty 50

## 2017-01-09 MED ORDER — HEPARIN (PORCINE) IN NACL 100-0.45 UNIT/ML-% IJ SOLN
1600.0000 [IU]/h | INTRAMUSCULAR | Status: DC
  Administered 2017-01-09: 800 [IU]/h via INTRAVENOUS
  Administered 2017-01-10: 1000 [IU]/h via INTRAVENOUS
  Administered 2017-01-11: 1600 [IU]/h via INTRAVENOUS
  Filled 2017-01-09 (×4): qty 250

## 2017-01-09 MED ORDER — MEPERIDINE HCL 25 MG/ML IJ SOLN: 6.2500 mg | INTRAMUSCULAR | Status: DC | PRN

## 2017-01-09 MED ORDER — ROCURONIUM BROMIDE 100 MG/10ML IV SOLN
INTRAVENOUS | Status: DC | PRN
  Administered 2017-01-09: 50 mg via INTRAVENOUS
  Administered 2017-01-09: 20 mg via INTRAVENOUS

## 2017-01-09 MED ORDER — SUGAMMADEX SODIUM 200 MG/2ML IV SOLN
INTRAVENOUS | Status: DC | PRN
  Administered 2017-01-09: 50 mg via INTRAVENOUS

## 2017-01-09 MED ORDER — SUFENTANIL CITRATE 50 MCG/ML IV SOLN
INTRAVENOUS | Status: DC | PRN
  Administered 2017-01-09: 10 ug via INTRAVENOUS

## 2017-01-09 MED ORDER — DOCUSATE SODIUM 100 MG PO CAPS
100.0000 mg | ORAL_CAPSULE | Freq: Every day | ORAL | Status: DC
  Administered 2017-01-10 – 2017-01-12 (×3): 100 mg via ORAL
  Filled 2017-01-09 (×3): qty 1

## 2017-01-09 MED ORDER — FENTANYL CITRATE (PF) 100 MCG/2ML IJ SOLN
INTRAMUSCULAR | Status: DC | PRN
  Administered 2017-01-09 (×2): 25 ug via INTRAVENOUS
  Administered 2017-01-09: 50 ug via INTRAVENOUS
  Administered 2017-01-09 (×2): 25 ug via INTRAVENOUS
  Administered 2017-01-09: 50 ug via INTRAVENOUS

## 2017-01-09 MED ORDER — GUAIFENESIN-DM 100-10 MG/5ML PO SYRP: 15.0000 mL | ORAL_SOLUTION | ORAL | Status: DC | PRN

## 2017-01-09 MED ORDER — SODIUM CHLORIDE 0.9 % IV SOLN
INTRAVENOUS | Status: DC | PRN
  Administered 2017-01-09: 08:00:00 via INTRAVENOUS

## 2017-01-09 MED ORDER — PHENYLEPHRINE 40 MCG/ML (10ML) SYRINGE FOR IV PUSH (FOR BLOOD PRESSURE SUPPORT)
PREFILLED_SYRINGE | INTRAVENOUS | Status: AC
  Filled 2017-01-09: qty 20

## 2017-01-09 MED ORDER — HYDRALAZINE HCL 20 MG/ML IJ SOLN: 5.0000 mg | INTRAMUSCULAR | Status: DC | PRN

## 2017-01-09 MED ORDER — FENTANYL CITRATE (PF) 100 MCG/2ML IJ SOLN
INTRAMUSCULAR | Status: AC
  Filled 2017-01-09: qty 2

## 2017-01-09 MED ORDER — MIDAZOLAM HCL 2 MG/2ML IJ SOLN
INTRAMUSCULAR | Status: DC | PRN
  Administered 2017-01-09: 1 mg via INTRAVENOUS

## 2017-01-09 SURGICAL SUPPLY — 64 items
ADH SKN CLS APL DERMABOND .7 (GAUZE/BANDAGES/DRESSINGS) ×1
AGENT HMST SPONGE THK3/8 (HEMOSTASIS) ×1
BANDAGE ESMARK 6X9 LF (GAUZE/BANDAGES/DRESSINGS) IMPLANT
BNDG CMPR 9X6 STRL LF SNTH (GAUZE/BANDAGES/DRESSINGS)
BNDG ESMARK 6X9 LF (GAUZE/BANDAGES/DRESSINGS)
CANISTER SUCTION 2500CC (MISCELLANEOUS) ×2 IMPLANT
CANNULA VESSEL 3MM 2 BLNT TIP (CANNULA) ×3 IMPLANT
CATH EMB 3FR 40CM (CATHETERS) ×2 IMPLANT
CATH EMB 4FR 40CM (CATHETERS) ×1 IMPLANT
CLIP TI MEDIUM 24 (CLIP) ×2 IMPLANT
CLIP TI WIDE RED SMALL 24 (CLIP) ×2 IMPLANT
CUFF TOURNIQUET SINGLE 24IN (TOURNIQUET CUFF) IMPLANT
CUFF TOURNIQUET SINGLE 34IN LL (TOURNIQUET CUFF) IMPLANT
CUFF TOURNIQUET SINGLE 44IN (TOURNIQUET CUFF) IMPLANT
DERMABOND ADVANCED (GAUZE/BANDAGES/DRESSINGS) ×1
DERMABOND ADVANCED .7 DNX12 (GAUZE/BANDAGES/DRESSINGS) IMPLANT
DRAIN CHANNEL 10F 3/8 F FF (DRAIN) ×1 IMPLANT
DRAIN SNY WOU (WOUND CARE) IMPLANT
DRAPE PROXIMA HALF (DRAPES) IMPLANT
DRAPE X-RAY CASS 24X20 (DRAPES) ×1 IMPLANT
ELECT REM PT RETURN 9FT ADLT (ELECTROSURGICAL) ×2
ELECTRODE REM PT RTRN 9FT ADLT (ELECTROSURGICAL) ×1 IMPLANT
EVACUATOR SILICONE 100CC (DRAIN) ×1 IMPLANT
GLOVE BIO SURGEON STRL SZ 6.5 (GLOVE) ×6 IMPLANT
GLOVE BIO SURGEON STRL SZ7.5 (GLOVE) ×2 IMPLANT
GLOVE BIOGEL PI IND STRL 6.5 (GLOVE) IMPLANT
GLOVE BIOGEL PI INDICATOR 6.5 (GLOVE) ×4
GLOVE SS N UNI LF 6.5 STRL (GLOVE) ×1 IMPLANT
GOWN STRL REUS W/ TWL LRG LVL3 (GOWN DISPOSABLE) ×3 IMPLANT
GOWN STRL REUS W/TWL LRG LVL3 (GOWN DISPOSABLE) ×12
HEMOSTAT SPONGE AVITENE ULTRA (HEMOSTASIS) ×1 IMPLANT
KIT BASIN OR (CUSTOM PROCEDURE TRAY) ×2 IMPLANT
KIT ROOM TURNOVER OR (KITS) ×2 IMPLANT
NS IRRIG 1000ML POUR BTL (IV SOLUTION) ×4 IMPLANT
PACK PERIPHERAL VASCULAR (CUSTOM PROCEDURE TRAY) ×2 IMPLANT
PAD ARMBOARD 7.5X6 YLW CONV (MISCELLANEOUS) ×4 IMPLANT
PATCH VASC XENOSURE 1CMX6CM (Vascular Products) ×2 IMPLANT
PATCH VASC XENOSURE 1X6 (Vascular Products) IMPLANT
SET COLLECT BLD 21X3/4 12 (NEEDLE) ×1 IMPLANT
SPONGE GAUZE 4X4 12PLY STER LF (GAUZE/BANDAGES/DRESSINGS) ×1 IMPLANT
STAPLER VISISTAT 35W (STAPLE) IMPLANT
STOPCOCK 4 WAY LG BORE MALE ST (IV SETS) ×1 IMPLANT
SUT ETHILON 3 0 PS 1 (SUTURE) ×1 IMPLANT
SUT PROLENE 5 0 C 1 24 (SUTURE) ×3 IMPLANT
SUT PROLENE 6 0 CC (SUTURE) ×11 IMPLANT
SUT PROLENE 7 0 BV 1 (SUTURE) IMPLANT
SUT PROLENE 7 0 BV1 MDA (SUTURE) IMPLANT
SUT SILK 2 0 SH (SUTURE) ×1 IMPLANT
SUT SILK 3 0 (SUTURE)
SUT SILK 3-0 18XBRD TIE 12 (SUTURE) IMPLANT
SUT VIC AB 2-0 SH 27 (SUTURE) ×4
SUT VIC AB 2-0 SH 27XBRD (SUTURE) ×2 IMPLANT
SUT VIC AB 3-0 SH 27 (SUTURE) ×2
SUT VIC AB 3-0 SH 27X BRD (SUTURE) ×4 IMPLANT
SUT VIC AB 4-0 PS2 27 (SUTURE) ×3 IMPLANT
SYRINGE 1CC SLIP TB (MISCELLANEOUS) ×1 IMPLANT
SYRINGE 3CC LL L/F (MISCELLANEOUS) ×1 IMPLANT
TAPE CLOTH SURG 4X10 WHT LF (GAUZE/BANDAGES/DRESSINGS) ×1 IMPLANT
TAPE UMBILICAL COTTON 1/8X30 (MISCELLANEOUS) IMPLANT
TRAY FOLEY W/METER SILVER 16FR (SET/KITS/TRAYS/PACK) ×2 IMPLANT
TUBE CONNECTING 20X1/4 (TUBING) ×1 IMPLANT
TUBING EXTENTION W/L.L. (IV SETS) ×1 IMPLANT
UNDERPAD 30X30 (UNDERPADS AND DIAPERS) ×2 IMPLANT
WATER STERILE IRR 1000ML POUR (IV SOLUTION) ×2 IMPLANT

## 2017-01-09 NOTE — ED Notes (Signed)
Dr fields at the bedside

## 2017-01-09 NOTE — Progress Notes (Signed)
Pt given 4mg  zofran IV for nausea. Consuelo Pandy RN

## 2017-01-09 NOTE — Progress Notes (Signed)
ANTICOAGULATION CONSULT NOTE - Initial Consult  Pharmacy Consult for Heparin Indication: Possible RLE arterial occlusion   Patient Measurements: Height: 5' 5.5" (166.4 cm) Weight: 140 lb (63.5 kg) IBW/kg (Calculated) : 58.15  Vital Signs: Temp: 97.7 F (36.5 C) (01/20 0437) Temp Source: Oral (01/20 0437) BP: 193/80 (01/20 0406) Pulse Rate: 74 (01/20 0406)   Medical History: Past Medical History:  Diagnosis Date  . Arthritis    "fingers, some in my knees" (01/28/2016)  . CAD (coronary artery disease), per cath 04/27/13, non obstructive disease, EF 65-70% 05/11/2013  . Carotid disease, bilateral ([place]) 09/2013   Lt carotid 50-69% stentosis, less on rt  . Dysrhythmia   . Fatty liver   . GERD (gastroesophageal reflux disease)   . Glaucoma   . H/O cardiovascular stress test 12/27/2009   negative for ischemia  . Headache    "occasional since eye pressure regulated" (01/28/2016)  . Hyperlipidemia   . Hypertension   . Leukocytosis 07/15/2015  . Migraine    "stopped w/laser holes to relieve pressure in my eyes; had them 3-4 times/wk before taht" (01/28/2016)  . Mitral regurgitation,2+ by cath 05/11/2013  . NSTEMI (non-ST elevated myocardial infarction) ([place]) 04/27/2013  . Pain    BOTH KNEES - PT HAS TORN MENISCUS LEFT KNEE  . Paroxysmal atrial fibrillation (HCC)    hx of a fib on ASA  AND ELIQUIS   . Polycythemia vera ([place]) 08/20/2015   JAK-2 positive 07/19/15  . PVD (peripheral vascular disease) with claudication ([place]) 07/2006   previous L ext iliac stent and rt SFA stent, known occluded Lt SFA  . S/P cardiac cath 04/27/13   NON OBSTRUCTIVE DISEASE, 2+ mr, ef 65-70%  . Statin intolerance   . Stroke ([place]) 2006   SWELLING ALL OVER AND RT SIDE OF FACE DRAWN AND SPEECH SLURRED AND NUMBNESS ON RIGHT SIDE-- ALL RESOLVED   Assessment: 66 y/o F with hx PVD and a fib who was on Apixaban but has NOT taken any in the last TWO MONTHS due to prescription running out. She has been taking full dose  aspirin in it's place. Labs have been drawn and are pending.   Goal of Therapy:  Heparin level 0.3-0.7 units/ml Monitor platelets by anticoagulation protocol: Yes   Plan:  -Heparin 4000 units BOLUS -Start heparin drip at 800 units -1400 HL -Daily CBC/HL -Monitor for bleeding  -F/U vascular surgery plans -F/U labs  Narda Bonds 01/09/2017,5:01 AM

## 2017-01-09 NOTE — ED Notes (Signed)
hepqarin verified by Lannie Fields

## 2017-01-09 NOTE — Evaluation (Signed)
Physical Therapy Evaluation Patient Details Name: [name] MRN: [id number] DOB: April 18, 1951 Today's Date: 01/09/2017   History of Present Illness  Pt is a 66 y/o female s/p thrombectomy of R fem-pop. PMH including but not limited to R fem-pop (01/31/16), CAD, CVA (2006), HTN, NSTEMI (04/2013), PAF, and PVD.  Clinical Impression  Pt presented supine in bed with HOB elevated, awake and willing to participate in therapy session. Prior to admission, pt reported that she was independent with all functional mobility and ADLs. Pt currently at mod I level for bed mobility, min guard for transfers and min guard for ambulation with RW. Pt very limited this session secondary to increased pain in R LE and emesis x2 during ambulation. Pt would continue to benefit from skilled physical therapy services at this time while admitted to address her below listed limitations in order to improve her overall safety and independence with functional mobility.       Follow Up Recommendations No PT follow up;Supervision for mobility/OOB    Equipment Recommendations  None recommended by PT    Recommendations for Other Services       Precautions / Restrictions Precautions Precautions: None Restrictions Weight Bearing Restrictions: No      Mobility  Bed Mobility Overal bed mobility: Modified Independent             General bed mobility comments: increased time  Transfers Overall transfer level: Needs assistance Equipment used: Rolling walker (2 wheeled) Transfers: Sit to/from Stand Sit to Stand: Min guard         General transfer comment: pt required increased time, min guard for safety  Ambulation/Gait Ambulation/Gait assistance: Min guard Ambulation Distance (Feet): 25 Feet Assistive device: Rolling walker (2 wheeled) Gait Pattern/deviations: Step-to pattern;Step-through pattern;Decreased step length - left;Decreased stance time - right;Decreased stride length;Decreased weight shift to  right;Antalgic Gait velocity: decreased Gait velocity interpretation: Below normal speed for age/gender General Gait Details: pt very limited with distance ambulated secondary to pain in R LE and nausea with emesis x2 during ambulation.  Stairs            Wheelchair Mobility    Modified Rankin (Stroke Patients Only)       Balance Overall balance assessment: Needs assistance Sitting-balance support: Feet supported Sitting balance-Leahy Scale: Good     Standing balance support: During functional activity;Bilateral upper extremity supported Standing balance-Leahy Scale: Poor Standing balance comment: pt reliant on bilateral UEs on RW                             Pertinent Vitals/Pain Pain Assessment: Faces Faces Pain Scale: Hurts even more Pain Location: R gastroc area Pain Descriptors / Indicators: Sore Pain Intervention(s): Monitored during session;Repositioned    Home Living Family/patient expects to be discharged to:: Private residence Living Arrangements: Other relatives (granddaughter) Available Help at Discharge: Family;Friend(s);Available PRN/intermittently Type of Home: House Home Access: Stairs to enter Entrance Stairs-Rails: Psychiatric nurse of Steps: 4 Home Layout: One level Home Equipment: Walker - 2 wheels      Prior Function Level of Independence: Independent               Hand Dominance        Extremity/Trunk Assessment   Upper Extremity Assessment Upper Extremity Assessment: Overall WFL for tasks assessed    Lower Extremity Assessment Lower Extremity Assessment: RLE deficits/detail RLE Deficits / Details: pt with decreased strength and ROM limitations secondary to post-op pain. Pt  also not able to tolerate full WB'ing through R LE. RLE: Unable to fully assess due to pain    Cervical / Trunk Assessment Cervical / Trunk Assessment: Normal  Communication   Communication: No difficulties  Cognition  Arousal/Alertness: Awake/alert Behavior During Therapy: WFL for tasks assessed/performed Overall Cognitive Status: Within Functional Limits for tasks assessed                      General Comments      Exercises     Assessment/Plan    PT Assessment Patient needs continued PT services  PT Problem List Decreased strength;Decreased range of motion;Decreased activity tolerance;Decreased balance;Decreased mobility;Decreased coordination;Decreased knowledge of use of DME;Decreased safety awareness;Pain          PT Treatment Interventions DME instruction;Gait training;Stair training;Functional mobility training;Therapeutic activities;Therapeutic exercise;Balance training;Neuromuscular re-education;Patient/family education    PT Goals (Current goals can be found in the Care Plan section)  Acute Rehab PT Goals Patient Stated Goal: return home PT Goal Formulation: With patient Time For Goal Achievement: 01/23/17 Potential to Achieve Goals: Good    Frequency Min 3X/week   Barriers to discharge        Co-evaluation               End of Session Equipment Utilized During Treatment: Gait belt Activity Tolerance: Patient limited by pain;Other (comment) (limited secondary to emesis x2) Patient left: in bed;with call bell/phone within reach;Other (comment) (RN requesting pt return to bed for IV placement) Nurse Communication: Mobility status;Other (comment) (pt with emesis x2 during ambulation)         Time: [id number] PT Time Calculation (min) (ACUTE ONLY): 26 min   Charges:   PT Evaluation $PT Eval Moderate Complexity: 1 Procedure PT Treatments $Gait Training: 8-22 mins   PT G CodesClearnce Sorrel Terik Haughey 01/09/2017, 4:19 PM Sherie Don, [place], DPT [phone]

## 2017-01-09 NOTE — ED Notes (Signed)
The pt has poor cap refill rt foot.  The rt foot is very white

## 2017-01-09 NOTE — Progress Notes (Signed)
ANTICOAGULATION CONSULT NOTE - Initial Consult  Pharmacy Consult for Heparin Indication: Possible RLE arterial occlusion   Patient Measurements: Height: 5' 5.5" (166.4 cm) Weight: 147 lb 14.9 oz (67.1 kg) IBW/kg (Calculated) : 58.15  Vital Signs: Temp: 97.4 F (36.3 C) (01/20 1230) Temp Source: Oral (01/20 1230) BP: 176/76 (01/20 1230) Pulse Rate: 74 (01/20 1230)   Medical History: Past Medical History:  Diagnosis Date  . Arthritis    "fingers, some in my knees" (01/28/2016)  . CAD (coronary artery disease), per cath 04/27/13, non obstructive disease, EF 65-70% 05/11/2013  . Carotid disease, bilateral ([place]) 09/2013   Lt carotid 50-69% stentosis, less on rt  . Dysrhythmia   . Fatty liver   . GERD (gastroesophageal reflux disease)   . Glaucoma   . H/O cardiovascular stress test 12/27/2009   negative for ischemia  . Headache    "occasional since eye pressure regulated" (01/28/2016)  . Hyperlipidemia   . Hypertension   . Leukocytosis 07/15/2015  . Migraine    "stopped w/laser holes to relieve pressure in my eyes; had them 3-4 times/wk before taht" (01/28/2016)  . Mitral regurgitation,2+ by cath 05/11/2013  . NSTEMI (non-ST elevated myocardial infarction) ([place]) 04/27/2013  . Pain    BOTH KNEES - PT HAS TORN MENISCUS LEFT KNEE  . Paroxysmal atrial fibrillation (HCC)    hx of a fib on ASA  AND ELIQUIS   . Polycythemia vera ([place]) 08/20/2015   JAK-2 positive 07/19/15  . PVD (peripheral vascular disease) with claudication ([place]) 07/2006   previous L ext iliac stent and rt SFA stent, known occluded Lt SFA  . S/P cardiac cath 04/27/13   NON OBSTRUCTIVE DISEASE, 2+ mr, ef 65-70%  . Statin intolerance   . Stroke ([place]) 2006   SWELLING ALL OVER AND RT SIDE OF FACE DRAWN AND SPEECH SLURRED AND NUMBNESS ON RIGHT SIDE-- ALL RESOLVED   Assessment: 66 y/o F with hx PVD and a fib who was on Apixaban but has NOT taken any in the last two months due to prescription running out. She has been taking full  dose aspirin in it's place. She was taken for thrombectomy of right femoral-popliteal artery bypass graft on 1/20.  Confirmatory heparin level was on the low end of therapeutic at 0.3 units/mL. The heparin was turned off for about 1 hour during procedure. Hemoglobin was 11.5 and platelet count 508 on last CBC. No bleeding issues noted.   Will continue heparin at current rate and get another 6-hour heparin level to ensure therapeutic.   Goal of Therapy:  Heparin level 0.3-0.7 units/ml Monitor platelets by anticoagulation protocol: Yes   Plan:  -Continue heparin drip at 800 units/hr -2130 HL (6-hr) -Daily CBC/HL -Monitor for bleeding   Demetrius Charity, PharmD Acute Care Pharmacy Resident  Pager: [phone] 01/09/2017

## 2017-01-09 NOTE — Progress Notes (Signed)
ANTICOAGULATION CONSULT NOTE - Initial Consult  Pharmacy Consult for Heparin Indication: Possible RLE arterial occlusion   Patient Measurements: Height: 5' 5.5" (166.4 cm) Weight: 147 lb 14.9 oz (67.1 kg) IBW/kg (Calculated) : 58.15  Vital Signs: Temp: 98.3 F (36.8 C) (01/20 2025) Temp Source: Oral (01/20 2025) BP: 142/57 (01/20 2200) Pulse Rate: 86 (01/20 2200)   Medical History: Past Medical History:  Diagnosis Date  . Arthritis    "fingers, some in my knees" (01/28/2016)  . CAD (coronary artery disease), per cath 04/27/13, non obstructive disease, EF 65-70% 05/11/2013  . Carotid disease, bilateral ([place]) 09/2013   Lt carotid 50-69% stentosis, less on rt  . Dysrhythmia   . Fatty liver   . GERD (gastroesophageal reflux disease)   . Glaucoma   . H/O cardiovascular stress test 12/27/2009   negative for ischemia  . Headache    "occasional since eye pressure regulated" (01/28/2016)  . Hyperlipidemia   . Hypertension   . Leukocytosis 07/15/2015  . Migraine    "stopped w/laser holes to relieve pressure in my eyes; had them 3-4 times/wk before taht" (01/28/2016)  . Mitral regurgitation,2+ by cath 05/11/2013  . NSTEMI (non-ST elevated myocardial infarction) ([place]) 04/27/2013  . Pain    BOTH KNEES - PT HAS TORN MENISCUS LEFT KNEE  . Paroxysmal atrial fibrillation (HCC)    hx of a fib on ASA  AND ELIQUIS   . Polycythemia vera ([place]) 08/20/2015   JAK-2 positive 07/19/15  . PVD (peripheral vascular disease) with claudication ([place]) 07/2006   previous L ext iliac stent and rt SFA stent, known occluded Lt SFA  . S/P cardiac cath 04/27/13   NON OBSTRUCTIVE DISEASE, 2+ mr, ef 65-70%  . Statin intolerance   . Stroke ([place]) 2006   SWELLING ALL OVER AND RT SIDE OF FACE DRAWN AND SPEECH SLURRED AND NUMBNESS ON RIGHT SIDE-- ALL RESOLVED   Assessment: 66 y/o F with hx PVD and a fib who was on Apixaban but has NOT taken any in the last two months due to prescription running out. She has been taking full  dose aspirin in it's place. She was taken for thrombectomy of right femoral-popliteal artery bypass graft on 1/20.  Confirmatory Heparin level drawn evening is undetectable after being therapeutic earlier today. No issues with infusion or sxs of bleeding.   Goal of Therapy:  Heparin level 0.3-0.7 units/ml Monitor platelets by anticoagulation protocol: Yes   Plan:  -Increase heparin drip to 1000 units/hr -Heparin level in am  -Daily CBC/HL -Monitor for bleeding    Vincenza Hews, PharmD, BCPS 01/09/2017, 10:31 PM Pager: [phone]

## 2017-01-09 NOTE — Progress Notes (Signed)
  Day of Surgery Note    Subjective:  C/o pain in her left chest like a "catch".  Doesn't hurt to palpate or move a certain way.  Right foot pain improved.   Vitals:   01/09/17 1152 01/09/17 1230  BP: (!) 160/69 (!) 176/76  Pulse: 88 74  Resp: 14 10  Temp: 97.9 F (36.6 C) 97.4 F (36.3 C)    Incisions:   Covered with bandage, which is clean and dry Extremities:  Right foot with monophasic doppler signal present PT/DP/peroneal Cardiac:  regular Lungs:  Non labored   Assessment/Plan:  This is a 66 y.o. female who is s/p thrombectomy of right femoral to popliteal bypass graft and tibial vessels with patch angioplasty  -pt with doppler signals present right foot. -does complain of some chest pressure that is not worsened by movement or palpation.  Will get EKG and troponins.   -continue npo for a little while since she just vomited a little bit ago   Leontine Locket, PA-C 01/09/2017 1:31 PM [phone]

## 2017-01-09 NOTE — ED Triage Notes (Addendum)
Pt to ED via [facility]> C/o pain to R lower leg/ankle since 1:45 am.  Reports numbness to R foot.  EMS reports R foot cold with faint pulse.  Pt took Tramadol 50 mg x 2 tabs at 0230 without relief. History of R lower leg vein repair.

## 2017-01-09 NOTE — H&P (Signed)
Referring Physician: Dr Gaylyn Rong ER  Patient name: [name] MRN: [id number] DOB: 12/30/1950 Sex: female  REASON FOR CONSULT: occluded right fem pop with ischemia  HPI: [name] is a 66 y.o. female, s/p right fem bk pop with ptfe 2/17 by Dr Kellie Simmering with popliteal endarterectomy by Dr Scot Dock about 2 days later.  She was doing ok until about 2 am when she began to have pain in her right foot.  This has now progressed to numbness and inability to move her toes.  She was last seen in our office about a month ago and duplex showed no narrowing with abi 1.0 on right 0.8 on left.  She had been on Eliquis for paroxysmal afib and polycythemia but stopped this on her own in December when her prescription ran out. She has been taking aspirin. Other medical problems include CAD, moderate carotid stenosis, arthritis hypertension, elevated cholesterol all of which have been stable.  Her CAD is follow by Dr [name].  Her polycythemia is followed by Dr Beryle Beams.  Past Medical History:  Diagnosis Date  . Arthritis    "fingers, some in my knees" (01/28/2016)  . CAD (coronary artery disease), per cath 04/27/13, non obstructive disease, EF 65-70% 05/11/2013  . Carotid disease, bilateral ([place]) 09/2013   Lt carotid 50-69% stentosis, less on rt  . Dysrhythmia   . Fatty liver   . GERD (gastroesophageal reflux disease)   . Glaucoma   . H/O cardiovascular stress test 12/27/2009   negative for ischemia  . Headache    "occasional since eye pressure regulated" (01/28/2016)  . Hyperlipidemia   . Hypertension   . Leukocytosis 07/15/2015  . Migraine    "stopped w/laser holes to relieve pressure in my eyes; had them 3-4 times/wk before taht" (01/28/2016)  . Mitral regurgitation,2+ by cath 05/11/2013  . NSTEMI (non-ST elevated myocardial infarction) ([place]) 04/27/2013  . Pain    BOTH KNEES - PT HAS TORN MENISCUS LEFT KNEE  . Paroxysmal atrial fibrillation (HCC)    hx of a fib on ASA  AND ELIQUIS   . Polycythemia  vera ([place]) 08/20/2015   JAK-2 positive 07/19/15  . PVD (peripheral vascular disease) with claudication ([place]) 07/2006   previous L ext iliac stent and rt SFA stent, known occluded Lt SFA  . S/P cardiac cath 04/27/13   NON OBSTRUCTIVE DISEASE, 2+ mr, ef 65-70%  . Statin intolerance   . Stroke ([place]) 2006   SWELLING ALL OVER AND RT SIDE OF FACE DRAWN AND SPEECH SLURRED AND NUMBNESS ON RIGHT SIDE-- ALL RESOLVED   Past Surgical History:  Procedure Laterality Date  . CARDIAC CATHETERIZATION  04/27/13   non occlusive disease with mild to mod. calcified lesions in ostial LM and proximal LAD and moderate prox. RC AND DISTAL AV GROOVE LCX, ef  . CATARACT EXTRACTION W/PHACO Right 03/21/2015   Procedure: CATARACT EXTRACTION PHACO AND INTRAOCULAR LENS PLACEMENT RIGHT EYE;  Surgeon: Baruch Goldmann, MD;  Location: [facility];  Service: Ophthalmology;  Laterality: Right;  CDE:5.10  . CATARACT EXTRACTION W/PHACO Left 05/16/2015   Procedure: CATARACT EXTRACTION PHACO AND INTRAOCULAR LENS PLACEMENT (IOC);  Surgeon: Baruch Goldmann, MD;  Location: [facility];  Service: Ophthalmology;  Laterality: Left;  CDE 5.57  . [place]  . COLONOSCOPY N/A 09/11/2016   Procedure: COLONOSCOPY;  Surgeon: Rogene Houston, MD;  Location: [facility];  Service: Endoscopy;  Laterality: N/A;  730-moved to 1:00 Ann notified pt  . EMBOLECTOMY Right 02/01/2016  Procedure: Thrombectomy of Right Femoral-Popliteal Bypass Graft; Endarterectomy of Right Below Knee Popliteal Artery and Tibial Peroneal Trunk with Bovine Pericardium Patch Angioplasty.;  Surgeon: Angelia Mould, MD;  Location: [place];  Service: Vascular;  Laterality: Right;  . FEMORAL-POPLITEAL BYPASS GRAFT Right 01/31/2016   Procedure: BYPASS GRAFT RIGHT COMMON  FEMORALTO BELOW KNEE POPLITEAL ARTERY BYPASS GRAFT USING 6MM PROPATEN GORTEX GRAFT;  Surgeon: Mal Misty, MD;  Location: [place];  Service: Vascular;  Laterality: Right;  . FRACTURE SURGERY    . ILIAC ARTERY STENT   07/2006   external iliac and Rt SFA stent 07/2006 AND THE LEFT WAS IN 2006  . KNEE ARTHROSCOPY WITH MEDIAL MENISECTOMY Left 03/27/2014   Procedure: left knee arthorscopy with medial chondraplasty of the medial femoral and patella, medial microfracture technique of medial femoral condyl;  Surgeon: Tobi Bastos, MD;  Location: [facility];  Service: Orthopedics;  Laterality: Left;  . LEFT HEART CATHETERIZATION WITH CORONARY ANGIOGRAM Right 04/27/2013   Procedure: LEFT HEART CATHETERIZATION WITH CORONARY ANGIOGRAM;  Surgeon: Leonie Man, MD;  Location: [facility] CATH LAB;  Service: Cardiovascular;  Laterality: Right;  . LOWER EXTREMITY ANGIOGRAM Right 01/31/2016   Procedure: INTRAOP RIGHT LOWER EXTREMITY ANGIOGRAM;  Surgeon: Mal Misty, MD;  Location: [place];  Service: Vascular;  Laterality: Right;  . ORIF WRIST FRACTURE Right 1983  . OVARIAN CYST REMOVAL Right   . PATCH ANGIOPLASTY Right 01/31/2016   Procedure: RIGHT COMMON FEMORAL AND PROFUNDA FEMORIS ENDARECTOMY WITH PATCH ANGIOPLASTY;  Surgeon: Mal Misty, MD;  Location: [place];  Service: Vascular;  Laterality: Right;  . PERIPHERAL VASCULAR CATHETERIZATION N/A 06/27/2015   Procedure: Lower Extremity Angiography;  Surgeon: Lorretta Harp, MD;  Location: [place] CV LAB;  Service: Cardiovascular;  Laterality: N/A;  . PERIPHERAL VASCULAR CATHETERIZATION Bilateral 01/29/2016   Procedure: Lower Extremity Angiography;  Surgeon: Wellington Hampshire, MD;  Location: [place] CV LAB;  Service: Cardiovascular;  Laterality: Bilateral;  . PERIPHERAL VASCULAR CATHETERIZATION N/A 01/29/2016   Procedure: Abdominal Aortogram;  Surgeon: Wellington Hampshire, MD;  Location: [place] CV LAB;  Service: Cardiovascular;  Laterality: N/A;  . REFRACTIVE SURGERY Bilateral    "6 in one eye; 7 in the other; to relieve pressure; not glaucoma" (01/28/2016)  . SFA Right 06/27/2015   overlapping [place]ahn covered stents  . TUBAL LIGATION  1983  . VEIN REPAIR Right 01/31/2016   Procedure:  RIGHT GREATER SAPHENOUS VEIN EXAMNED BUT NOT REMOVED;  Surgeon: Mal Misty, MD;  Location: [place];  Service: Vascular;  Laterality: Right;    Family History  Problem Relation Age of Onset  . CAD Mother   . Lung cancer Mother   . Heart attack Mother   . CAD Father   . Heart disease Father   . Heart attack Father 71    died in hes 38's with heart disease  . Healthy Sister   . Liver cancer Brother   . Healthy Sister   . CAD Brother     has had CABG  . CAD Brother     SOCIAL HISTORY: Social History   Social History  . Marital status: Widowed    Spouse name: N/A  . Number of children: 2  . Years of education: 12   Occupational History  . CNA    Social History Main Topics  . Smoking status: Former Smoker    Packs/day: 1.00    Years: 45.00    Types: Cigarettes    Quit date:  11/11/2012  . Smokeless tobacco: Never Used     Comment: quit 4 years ago  . Alcohol use No  . Drug use: No  . Sexual activity: Not Currently    Birth control/ protection: None   Other Topics Concern  . Not on file   Social History Narrative  . No narrative on file    Allergies  Allergen Reactions  . Clopidogrel Itching  . Oxycodone-Acetaminophen Itching  . Prednisone Other (See Comments)    Muscle spasms  . Statins Itching and Other (See Comments)    Simvastatin-caused severe itching Pravastatin-caused lesser tiching One type of statin? Caused stroke in 2006, and other symptoms as result  . Venlafaxine Itching  . Warfarin And Related Other (See Comments)    Caused nose bleeds  . Esomeprazole Rash  . Lactose Intolerance (Gi) Other (See Comments)    Bloating, gas  . Penicillins Hives and Other (See Comments)    Questionable high fever when taken and broke out in whelps.   . Simvastatin Itching and Rash    Also lipitor intolerant  . Warfarin Other (See Comments)    nosebleeds  . Atenolol Rash  . Crestor [Rosuvastatin] Itching and Rash  . Demerol [Meperidine] Other (See  Comments)    Unknown, can't remember   . Effexor [Venlafaxine Hcl] Other (See Comments)    Doesn't remember   . Inderal [Propranolol] Other (See Comments)    Doesn't remember   . Lipitor [Atorvastatin] Rash  . Lovaza [Omega-3-Acid Ethyl Esters] Other (See Comments)    Doesn't remember   . Nexium [Esomeprazole Magnesium] Rash  . Percocet [Oxycodone-Acetaminophen] Other (See Comments)    Doesn't remember   . Plavix [Clopidogrel Bisulfate] Rash  . Pravastatin Itching and Rash  . Trilipix [Choline Fenofibrate] Other (See Comments)    Doesn't remember     Current Facility-Administered Medications  Medication Dose Route Frequency Provider Last Rate Last Dose  . heparin ADULT infusion 100 units/mL (25000 units/239mL sodium chloride 0.45%)  800 Units/hr Intravenous Continuous Erenest Blank, RPH 8 mL/hr at 01/09/17 0533 800 Units/hr at 01/09/17 0533   Current Outpatient Prescriptions  Medication Sig Dispense Refill  . acetaminophen (TYLENOL) 500 MG tablet Take 1,000 mg by mouth every 6 (six) hours as needed for moderate pain.     Marland Kitchen aspirin EC 325 MG tablet Take 325 mg by mouth daily.    . carboxymethylcellulose (REFRESH PLUS) 0.5 % SOLN Place 1 drop into both eyes 3 (three) times daily as needed (dry eye).     Marland Kitchen diltiazem (CARDIZEM CD) 180 MG 24 hr capsule Take 180 mg by mouth every morning.    . docusate sodium (COLACE) 100 MG capsule Take 200 mg by mouth every evening.     . ezetimibe (ZETIA) 10 MG tablet TAKE ONE TABLET AT BEDTIME 30 tablet 5  . fenofibrate (TRICOR) 145 MG tablet TAKE ONE TABLET AT BEDTIME 30 tablet 5  . flecainide (TAMBOCOR) 50 MG tablet TAKE (1) TABLET TWICE A DAY. 60 tablet 1  . lisinopril (PRINIVIL,ZESTRIL) 5 MG tablet Take 20 mg by mouth daily.     Marland Kitchen omeprazole (PRILOSEC) 20 MG capsule Take 20 mg by mouth 2 (two) times daily before a meal.     . polyvinyl alcohol (LIQUIFILM TEARS) 1.4 % ophthalmic solution Place 1 drop into both eyes as needed for dry eyes.    .  traMADol (ULTRAM) 50 MG tablet Take 1 tablet (50 mg total) by mouth See admin instructions. Currently: taking med  between 2-3 times daily for pain 30 tablet 0  . ELIQUIS 5 MG TABS tablet TAKE ONE TABLET BY MOUTH TWICE A DAY AS DIRECTED (Patient not taking: Reported on 11/25/2016) 60 tablet 4    ROS:   General:  No weight loss, Fever, chills  HEENT: No recent headaches, no nasal bleeding, no visual changes, no sore throat  Neurologic: No dizziness, blackouts, seizures. No recent symptoms of stroke or mini- stroke. No recent episodes of slurred speech, or temporary blindness.  Cardiac: No recent episodes of chest pain/pressure, no shortness of breath at rest.  No shortness of breath with exertion.  Denies history of atrial fibrillation or irregular heartbeat  Vascular: No history of rest pain in feet.  No history of claudication.  No history of non-healing ulcer, No history of DVT   Pulmonary: No home oxygen, no productive cough, no hemoptysis,  No asthma or wheezing  Musculoskeletal:  [X]  Arthritis, [X]  Low back pain,  [X]  Joint pain  Hematologic:No history of hypercoagulable state.  No history of easy bleeding.  No history of anemia  Gastrointestinal: No hematochezia or melena,  No gastroesophageal reflux, no trouble swallowing  Urinary: [ ]  chronic Kidney disease, [ ]  on HD - [ ]  MWF or [ ]  TTHS, [ ]  Burning with urination, [ ]  Frequent urination, [ ]  Difficulty urinating;   Skin: No rashes  Psychological: No history of anxiety,  No history of depression   Physical Examination  Vitals:   01/09/17 0430 01/09/17 0437 01/09/17 0500 01/09/17 0530  BP: 193/78  (!) 170/152 183/68  Pulse: 71  73 83  Resp:      Temp:  97.7 F (36.5 C)    TempSrc:  Oral    SpO2: 98%  99% 91%  Weight:      Height:        Body mass index is 22.94 kg/m.  General:  Alert and oriented, no acute distress HEENT: Normal Neck: No JVD Pulmonary: Clear to auscultation bilaterally Cardiac: Regular  Rate and Rhythm  Abdomen: Soft, non-tender, non-distended Skin: No rash, right foot pale cool Extremity Pulses:  2+ radial, brachial, femoral, absent dorsalis pedis, posterior tibial pulses bilaterally, dp pt left doppler none on right Musculoskeletal: No deformity or edema  Neurologic: Upper and lower extremity motor 5/5 and symmetric  DATA:  CBC    Component Value Date/Time   WBC 26.9 (H) 01/09/2017 0450   RBC 6.00 (H) 01/09/2017 0450   HGB 11.5 (L) 01/09/2017 0450   HCT 39.6 01/09/2017 0450   HCT 36.9 09/30/2016 1019   PLT 508 (H) 01/09/2017 0450   PLT 395 (H) 09/30/2016 1019   MCV 66.0 (L) 01/09/2017 0450   MCV 64 (L) 09/30/2016 1019   MCH 19.2 (L) 01/09/2017 0450   MCHC 29.0 (L) 01/09/2017 0450   RDW 22.2 (H) 01/09/2017 0450   RDW 21.1 (H) 09/30/2016 1019   LYMPHSABS PENDING 01/09/2017 0450   LYMPHSABS 1.3 09/30/2016 1019   MONOABS PENDING 01/09/2017 0450   EOSABS PENDING 01/09/2017 0450   EOSABS 0.5 (H) 09/30/2016 1019   BASOSABS PENDING 01/09/2017 0450   BASOSABS 0.2 09/30/2016 1019    BMET    Component Value Date/Time   NA 138 01/09/2017 0450   K 4.4 01/09/2017 0450   CL 105 01/09/2017 0450   CO2 22 01/09/2017 0450   GLUCOSE 114 (H) 01/09/2017 0450   BUN 22 (H) 01/09/2017 0450   CREATININE 0.92 01/09/2017 0450   CREATININE 1.33 (H) 03/03/2016 1531  CALCIUM 9.2 01/09/2017 0450   GFRNONAA >60 01/09/2017 0450   GFRAA >60 01/09/2017 0450     ASSESSMENT:  Right fem pop occluded with limb threatening rapidly progressing ischemia most likely this is secondary to her polycythemia since graft duplex 1 month ago looked good   PLAN:  To OR for thrombectomy.  Risk of infection bleeding limb loss myocardial events discussed with pt.  She wishes to proceed.   Ruta Hinds, MD [facility] Office: [phone] Pager: [phone]

## 2017-01-09 NOTE — Transfer of Care (Signed)
Immediate Anesthesia Transfer of Care Note  Patient: [name]  Procedure(s) Performed: Procedure(s): THROMBECTOMY OF RIGHT FEMORAL-POPLITEAL  ARTERY BYPASS GRAFT; THROMBECTOMY RIGHT  TIBIAL VESSELS (Right) PATCH ANGIOPLASTY RIGHT POPLITEAL ARTERY BYPASS GRAFT (Right)  Patient Location: PACU  Anesthesia Type:General  Level of Consciousness: awake, alert  and oriented  Airway & Oxygen Therapy: Patient Spontanous Breathing  Post-op Assessment: Report given to RN and Post -op Vital signs reviewed and stable  Post vital signs: Reviewed and stable  Last Vitals:  Vitals:   01/09/17 0545 01/09/17 0600  BP: 196/75 (!) 207/73  Pulse: 87 78  Resp:    Temp:      Last Pain:  Vitals:   01/09/17 0513  TempSrc:   PainSc: 10-Worst pain ever         Complications: No apparent anesthesia complications

## 2017-01-09 NOTE — Anesthesia Postprocedure Evaluation (Addendum)
Anesthesia Post Note  Patient: [name]  Procedure(s) Performed: Procedure(s) (LRB): THROMBECTOMY OF RIGHT FEMORAL-POPLITEAL  ARTERY BYPASS GRAFT; THROMBECTOMY RIGHT  TIBIAL VESSELS (Right) PATCH ANGIOPLASTY RIGHT POPLITEAL ARTERY BYPASS GRAFT (Right)  Patient location during evaluation: PACU Anesthesia Type: General Level of consciousness: awake and alert Pain management: pain level controlled Vital Signs Assessment: post-procedure vital signs reviewed and stable Respiratory status: spontaneous breathing, nonlabored ventilation, respiratory function stable and patient connected to nasal cannula oxygen Cardiovascular status: blood pressure returned to baseline and stable Postop Assessment: no signs of nausea or vomiting Anesthetic complications: no       Last Vitals:  Vitals:   01/09/17 1152 01/09/17 1230  BP: (!) 160/69 (!) 176/76  Pulse: 88 74  Resp: 14 10  Temp: 36.6 C 36.3 C    Last Pain:  Vitals:   01/09/17 1440  TempSrc:   PainSc: [place]

## 2017-01-09 NOTE — Anesthesia Preprocedure Evaluation (Addendum)
Anesthesia Evaluation  Patient identified by MRN, date of birth, ID band Patient awake    Reviewed: Allergy & Precautions, NPO status , Patient's Chart, lab work & pertinent test results  Airway Mallampati: I  TM Distance: >3 FB Neck ROM: Full    Dental  (+) Edentulous Upper, Edentulous Lower, Dental Advisory Given   Pulmonary former smoker,    Pulmonary exam normal breath sounds clear to auscultation       Cardiovascular hypertension, Pt. on medications + CAD, + Past MI and + Peripheral Vascular Disease  Normal cardiovascular exam+ dysrhythmias Atrial Fibrillation  Rhythm:Regular Rate:Normal     Neuro/Psych  Headaches, CVA negative psych ROS   GI/Hepatic Neg liver ROS, GERD  Medicated and Controlled,  Endo/Other  negative endocrine ROS  Renal/GU negative Renal ROS  negative genitourinary   Musculoskeletal  (+) Arthritis , Osteoarthritis,    Abdominal   Peds  Hematology negative hematology ROS (+) Polycythemia   Anesthesia Other Findings   Reproductive/Obstetrics                          Lab Results  Component Value Date   WBC 26.9 (H) 01/09/2017   HGB 11.5 (L) 01/09/2017   HCT 39.6 01/09/2017   MCV 66.0 (L) 01/09/2017   PLT 508 (H) 01/09/2017   Lab Results  Component Value Date   CREATININE 0.92 01/09/2017   BUN 22 (H) 01/09/2017   NA 138 01/09/2017   K 4.4 01/09/2017   CL 105 01/09/2017   CO2 22 01/09/2017   Lab Results  Component Value Date   INR 1.07 01/09/2017   INR 1.09 01/31/2016   INR 1.25 01/28/2016   EKG: normal sinus rhythm.  Anesthesia Physical Anesthesia Plan  ASA: III and emergent  Anesthesia Plan: General   Post-op Pain Management:    Induction: Intravenous  Airway Management Planned: Oral ETT  Additional Equipment:   Intra-op Plan:   Post-operative Plan: Extubation in OR  Informed Consent: I have reviewed the patients History and Physical,  chart, labs and discussed the procedure including the risks, benefits and alternatives for the proposed anesthesia with the patient or authorized representative who has indicated his/her understanding and acceptance.     Plan Discussed with: CRNA and Surgeon  Anesthesia Plan Comments:         Anesthesia Quick Evaluation

## 2017-01-09 NOTE — Progress Notes (Signed)
Pt with faint monophasic PT very faint AT doppler Will warm foot to see if flow improves Completion angio shows patent AT with flow stopping at ankle and some flow to the peroneal both vessels are very small Keep NPO for now.  Very high risk for limb loss if she rethromboses.  Continue heparin.  Would return to OR one last time to attempt limb salvage if she reoccludes in the short term  Ruta Hinds, MD [facility] [address]: [phone] Pager: [phone]

## 2017-01-09 NOTE — Anesthesia Procedure Notes (Signed)
Procedure Name: Intubation Date/Time: 01/09/2017 8:22 AM Performed by: Merdis Delay Pre-anesthesia Checklist: Patient identified, Emergency Drugs available, Suction available, Patient being monitored and Timeout performed Patient Re-evaluated:Patient Re-evaluated prior to inductionOxygen Delivery Method: Circle system utilized Preoxygenation: Pre-oxygenation with 100% oxygen Intubation Type: IV induction Ventilation: Mask ventilation without difficulty Laryngoscope Size: Mac and 3 Grade View: Grade I Tube type: Oral Tube size: 7.5 mm Number of attempts: 1 Airway Equipment and Method: Stylet Placement Confirmation: ETT inserted through vocal cords under direct vision,  positive ETCO2,  CO2 detector and breath sounds checked- equal and bilateral Secured at: 22 cm Tube secured with: Tape Dental Injury: Teeth and Oropharynx as per pre-operative assessment

## 2017-01-09 NOTE — Progress Notes (Signed)
CRITICAL VALUE ALERT  Critical value received: troponin 0.03  Date of notification: 01/09/17  Time of notification:    Critical value read back:y  Nurse who received alert:  Consuelo Pandy RN  MD notified (1st page): Dr Oneida Alar  Time of first page:    MD notified (2nd page):  Time of second page:  Responding MD: Dr Oneida Alar  Time MD responded:  [phone]

## 2017-01-09 NOTE — ED Provider Notes (Signed)
[facility] DEPT Provider Note   CSN: [id number] Arrival date & time: 01/09/17  0357     History   Chief Complaint Chief Complaint  Patient presents with  . Leg Pain    HPI [name] is a 66 y.o. female PMH of PAD, with vein grating of the RLE here with acute onset pain tonight.  She states the pain came out of nowhere after walking to the bathroom.  Subsequently, she had numbness and can not feel her foot.  She has been normal for the past year since her surgery and just recently had a normal clinic visit with Dr. Scot Dock.  She denies trauma. There are no further complaints.  10 Systems reviewed and are negative for acute change except as noted in the HPI.   HPI  Past Medical History:  Diagnosis Date  . Arthritis    "fingers, some in my knees" (01/28/2016)  . CAD (coronary artery disease), per cath 04/27/13, non obstructive disease, EF 65-70% 05/11/2013  . Carotid disease, bilateral ([place]) 09/2013   Lt carotid 50-69% stentosis, less on rt  . Dysrhythmia   . Fatty liver   . GERD (gastroesophageal reflux disease)   . Glaucoma   . H/O cardiovascular stress test 12/27/2009   negative for ischemia  . Headache    "occasional since eye pressure regulated" (01/28/2016)  . Hyperlipidemia   . Hypertension   . Leukocytosis 07/15/2015  . Migraine    "stopped w/laser holes to relieve pressure in my eyes; had them 3-4 times/wk before taht" (01/28/2016)  . Mitral regurgitation,2+ by cath 05/11/2013  . NSTEMI (non-ST elevated myocardial infarction) ([place]) 04/27/2013  . Pain    BOTH KNEES - PT HAS TORN MENISCUS LEFT KNEE  . Paroxysmal atrial fibrillation (HCC)    hx of a fib on ASA  AND ELIQUIS   . Polycythemia vera ([place]) 08/20/2015   JAK-2 positive 07/19/15  . PVD (peripheral vascular disease) with claudication ([place]) 07/2006   previous L ext iliac stent and rt SFA stent, known occluded Lt SFA  . S/P cardiac cath 04/27/13   NON OBSTRUCTIVE DISEASE, 2+ mr, ef 65-70%  . Statin intolerance   .  Stroke ([place]) 2006   SWELLING ALL OVER AND RT SIDE OF FACE DRAWN AND SPEECH SLURRED AND NUMBNESS ON RIGHT SIDE-- ALL RESOLVED    Patient Active Problem List   Diagnosis Date Noted  . History of colonic polyps 07/30/2016  . Essential hypertension 01/08/2016  . Polycythemia vera ([place]) 08/20/2015  . Leukocytosis 07/15/2015  . S/P arterial stent right SFA overlapping Bahn covered stents 06/27/2015 06/28/2015  . Claudication ([place]) 06/27/2015  . Carotid artery disease ([place]) 08/24/2014  . Osteoarthritis of left knee 03/27/2014  . External hemorrhoids 06/06/2013  . PAF (paroxysmal atrial fibrillation), maintaing SR 05/11/2013  . Chronic anticoagulation, new- eliquis 05/11/2013  . CAD (coronary artery disease), per cath 04/27/13, non obstructive disease 20-30% LM; LAD 30%; 50-60% OM2; RCA 40%; EF 65-70% 05/11/2013  . Mitral regurgitation,2+ by cath 05/11/2013  . Atrial fibrillation with RVR, hx of PAF previously 04/27/2013  . PAD (peripheral artery disease), Lt carotid 50-70%: , Hx stent Lt exteral iliac & RSFA stent, known occl. LSFA  04/27/2013  . Hyperlipidemia 04/27/2013  . History of CVA (cerebrovascular accident) 04/27/2013  . NSTEMI (non-ST elevated myocardial infarction) ([place]) 04/27/2013    Past Surgical History:  Procedure Laterality Date  . CARDIAC CATHETERIZATION  04/27/13   non occlusive disease with mild to mod. calcified lesions in ostial LM  and proximal LAD and moderate prox. RC AND DISTAL AV GROOVE LCX, ef  . CATARACT EXTRACTION W/PHACO Right 03/21/2015   Procedure: CATARACT EXTRACTION PHACO AND INTRAOCULAR LENS PLACEMENT RIGHT EYE;  Surgeon: Baruch Goldmann, MD;  Location: [facility];  Service: Ophthalmology;  Laterality: Right;  CDE:5.10  . CATARACT EXTRACTION W/PHACO Left 05/16/2015   Procedure: CATARACT EXTRACTION PHACO AND INTRAOCULAR LENS PLACEMENT (IOC);  Surgeon: Baruch Goldmann, MD;  Location: [facility];  Service: Ophthalmology;  Laterality: Left;  CDE 5.57  . [place]  .  COLONOSCOPY N/A 09/11/2016   Procedure: COLONOSCOPY;  Surgeon: Rogene Houston, MD;  Location: [facility];  Service: Endoscopy;  Laterality: N/A;  730-moved to 1:00 Ann notified pt  . EMBOLECTOMY Right 02/01/2016   Procedure: Thrombectomy of Right Femoral-Popliteal Bypass Graft; Endarterectomy of Right Below Knee Popliteal Artery and Tibial Peroneal Trunk with Bovine Pericardium Patch Angioplasty.;  Surgeon: Angelia Mould, MD;  Location: [facility];  Service: Vascular;  Laterality: Right;  . FEMORAL-POPLITEAL BYPASS GRAFT Right 01/31/2016   Procedure: BYPASS GRAFT RIGHT COMMON  FEMORALTO BELOW KNEE POPLITEAL ARTERY BYPASS GRAFT USING 6MM PROPATEN GORTEX GRAFT;  Surgeon: Mal Misty, MD;  Location: [facility];  Service: Vascular;  Laterality: Right;  . FRACTURE SURGERY    . ILIAC ARTERY STENT  07/2006   external iliac and Rt SFA stent 07/2006 AND THE LEFT WAS IN 2006  . KNEE ARTHROSCOPY WITH MEDIAL MENISECTOMY Left 03/27/2014   Procedure: left knee arthorscopy with medial chondraplasty of the medial femoral and patella, medial microfracture technique of medial femoral condyl;  Surgeon: Tobi Bastos, MD;  Location: [facility];  Service: Orthopedics;  Laterality: Left;  . LEFT HEART CATHETERIZATION WITH CORONARY ANGIOGRAM Right 04/27/2013   Procedure: LEFT HEART CATHETERIZATION WITH CORONARY ANGIOGRAM;  Surgeon: Leonie Man, MD;  Location: [facility] CATH LAB;  Service: Cardiovascular;  Laterality: Right;  . LOWER EXTREMITY ANGIOGRAM Right 01/31/2016   Procedure: INTRAOP RIGHT LOWER EXTREMITY ANGIOGRAM;  Surgeon: Mal Misty, MD;  Location: [facility];  Service: Vascular;  Laterality: Right;  . ORIF WRIST FRACTURE Right 1983  . OVARIAN CYST REMOVAL Right   . PATCH ANGIOPLASTY Right 01/31/2016   Procedure: RIGHT COMMON FEMORAL AND PROFUNDA FEMORIS ENDARECTOMY WITH PATCH ANGIOPLASTY;  Surgeon: Mal Misty, MD;  Location: [facility];  Service: Vascular;  Laterality: Right;  . PERIPHERAL VASCULAR CATHETERIZATION N/A  06/27/2015   Procedure: Lower Extremity Angiography;  Surgeon: Lorretta Harp, MD;  Location: [facility] CV LAB;  Service: Cardiovascular;  Laterality: N/A;  . PERIPHERAL VASCULAR CATHETERIZATION Bilateral 01/29/2016   Procedure: Lower Extremity Angiography;  Surgeon: Wellington Hampshire, MD;  Location: [facility] CV LAB;  Service: Cardiovascular;  Laterality: Bilateral;  . PERIPHERAL VASCULAR CATHETERIZATION N/A 01/29/2016   Procedure: Abdominal Aortogram;  Surgeon: Wellington Hampshire, MD;  Location: [facility] CV LAB;  Service: Cardiovascular;  Laterality: N/A;  . REFRACTIVE SURGERY Bilateral    "6 in one eye; 7 in the other; to relieve pressure; not glaucoma" (01/28/2016)  . SFA Right 06/27/2015   overlapping Bahn covered stents  . TUBAL LIGATION  1983  . VEIN REPAIR Right 01/31/2016   Procedure: RIGHT GREATER SAPHENOUS VEIN EXAMNED BUT NOT REMOVED;  Surgeon: Mal Misty, MD;  Location: [facility];  Service: Vascular;  Laterality: Right;    OB History    No data available       Home Medications    Prior to Admission medications   Medication Sig  Start Date End Date Taking? Authorizing Provider  acetaminophen (TYLENOL) 500 MG tablet Take 1,000 mg by mouth every 6 (six) hours as needed for moderate pain.     Historical Provider, MD  aspirin EC 81 MG EC tablet Take 1 tablet (81 mg total) by mouth daily. Patient taking differently: Take 325 mg by mouth daily.  06/28/15   Isaiah Serge, NP  carboxymethylcellulose (REFRESH PLUS) 0.5 % SOLN 1 drop 3 (three) times daily as needed.    Historical Provider, MD  diltiazem (CARDIZEM CD) 180 MG 24 hr capsule Take 180 mg by mouth every morning.    Historical Provider, MD  docusate sodium (COLACE) 100 MG capsule Take 200 mg by mouth daily as needed for mild constipation (FOR CONSTIPATION).     Historical Provider, MD  ELIQUIS 5 MG TABS tablet TAKE ONE TABLET BY MOUTH TWICE A DAY AS DIRECTED Patient not taking: Reported on 11/25/2016 11/21/15   Lorretta Harp,  MD  ezetimibe (ZETIA) 10 MG tablet TAKE ONE TABLET AT BEDTIME 08/10/16   Lorretta Harp, MD  fenofibrate (TRICOR) 145 MG tablet TAKE ONE TABLET AT BEDTIME 08/10/16   Lorretta Harp, MD  flecainide (TAMBOCOR) 50 MG tablet TAKE (1) TABLET TWICE A DAY. 12/16/16   Lorretta Harp, MD  hydrochlorothiazide (HYDRODIURIL) 25 MG tablet Take 25 mg by mouth 2 (two) times daily.  02/12/16   Historical Provider, MD  lisinopril (PRINIVIL,ZESTRIL) 5 MG tablet Take 5 mg by mouth daily.  01/08/16   Historical Provider, MD  omeprazole (PRILOSEC) 20 MG capsule Take 20 mg by mouth 2 (two) times daily before a meal.     Historical Provider, MD  oxyCODONE-acetaminophen (PERCOCET/ROXICET) 5-325 MG tablet Take 1-2 tablets by mouth every 4 (four) hours as needed for moderate pain. Patient not taking: Reported on 11/25/2016 02/07/16   Alvia Grove, PA-C  polyvinyl alcohol (LIQUIFILM TEARS) 1.4 % ophthalmic solution Place 1 drop into both eyes as needed for dry eyes.    Historical Provider, MD  pravastatin (PRAVACHOL) 40 MG tablet TAKE 1 TABLET (40 MG TOTAL) BY MOUTH EVERY EVENING. Patient not taking: Reported on 11/25/2016 02/27/16   Lorretta Harp, MD  traMADol (ULTRAM) 50 MG tablet Take 1 tablet (50 mg total) by mouth See admin instructions. Currently: taking med between 2-3 times daily for pain 02/06/16   Gabriel Earing, PA-C    Family History Family History  Problem Relation Age of Onset  . CAD Mother   . Lung cancer Mother   . Heart attack Mother   . CAD Father   . Heart disease Father   . Heart attack Father 67    died in hes 19's with heart disease  . Healthy Sister   . Liver cancer Brother   . Healthy Sister   . CAD Brother     has had CABG  . CAD Brother     Social History Social History  Substance Use Topics  . Smoking status: Former Smoker    Packs/day: 1.00    Years: 45.00    Types: Cigarettes    Quit date: 11/11/2012  . Smokeless tobacco: Never Used     Comment: quit 4 years ago  .  Alcohol use No     Allergies   Clopidogrel; Oxycodone-acetaminophen; Prednisone; Statins; Venlafaxine; Warfarin and related; Esomeprazole; Lactose intolerance (gi); Penicillins; Simvastatin; Warfarin; Atenolol; Crestor [rosuvastatin]; Demerol [meperidine]; Effexor [venlafaxine hcl]; Inderal [propranolol]; Lipitor [atorvastatin]; Lovaza [omega-3-acid ethyl esters]; Nexium [esomeprazole magnesium]; Percocet [oxycodone-acetaminophen];  Plavix [clopidogrel bisulfate]; Pravastatin; and Trilipix [choline fenofibrate]   Review of Systems Review of Systems   Physical Exam Updated Vital Signs BP 193/80 (BP Location: Right Arm)   Pulse 74   Temp 97.7 F (36.5 C) (Oral)   Resp 20   Ht 5' 5.5" (1.664 m)   Wt 140 lb (63.5 kg)   SpO2 97%   BMI 22.94 kg/m   Physical Exam  Constitutional: She is oriented to person, place, and time. She appears well-developed and well-nourished. She appears distressed.  HENT:  Head: Normocephalic and atraumatic.  Nose: Nose normal.  Mouth/Throat: Oropharynx is clear and moist. No oropharyngeal exudate.  Eyes: Conjunctivae and EOM are normal. Pupils are equal, round, and reactive to light. No scleral icterus.  Neck: Normal range of motion. Neck supple. No JVD present. No tracheal deviation present. No thyromegaly present.  Cardiovascular: Normal rate, regular rhythm and normal heart sounds.  Exam reveals no gallop and no friction rub.   No murmur heard. Pulmonary/Chest: Effort normal and breath sounds normal. No respiratory distress. She has no wheezes. She exhibits no tenderness.  Abdominal: Soft. Bowel sounds are normal. She exhibits no distension and no mass. There is no tenderness. There is no rebound and no guarding.  Musculoskeletal: Normal range of motion. She exhibits no edema or tenderness.  RLE reveals a cold and blue foot.  No pulse signals could be found in DP or PT.  Decreased sensation in the foot  Lymphadenopathy:    She has no cervical  adenopathy.  Neurological: She is alert and oriented to person, place, and time. No cranial nerve deficit. She exhibits normal muscle tone.  Skin: Skin is warm and dry. No rash noted. No erythema. No pallor.  Nursing note and vitals reviewed.    ED Treatments / Results  Labs (all labs ordered are listed, but only abnormal results are displayed) Labs Reviewed  CBC WITH DIFFERENTIAL/PLATELET  BASIC METABOLIC PANEL  PROTIME-INR  I-STAT CG4 LACTIC ACID, ED    EKG  EKG Interpretation None       Radiology No results found.  Procedures Procedures (including critical care time)  Medications Ordered in ED Medications  HYDROmorphone (DILAUDID) injection 1 mg (not administered)  sodium chloride 0.9 % bolus 1,000 mL (not administered)     Initial Impression / Assessment and Plan / ED Course  I have reviewed the triage vital signs and the nursing notes.  Pertinent labs & imaging results that were available during my care of the patient were reviewed by me and considered in my medical decision making (see chart for details).    Patient presents to the ED for foot pain. I have concern for arterial occlusion. She is already on eloquis currently. I spoke with Dr. Oneida Alar who is on call and he will evaluate the patient.  Advise on heparin drip which was ordered. Patient given dilaudid for pain control.  Labs are pending.  I anticipate admission for further care.  CRITICAL CARE Performed by: Everlene Balls   Total critical care time: 40 minutes - limb threatening ischemia  Critical care time was exclusive of separately billable procedures and treating other patients.  Critical care was necessary to treat or prevent imminent or life-threatening deterioration.  Critical care was time spent personally by me on the following activities: development of treatment plan with patient and/or surrogate as well as nursing, discussions with consultants, evaluation of patient's response to  treatment, examination of patient, obtaining history from patient or surrogate, ordering and performing  treatments and interventions, ordering and review of laboratory studies, ordering and review of radiographic studies, pulse oximetry and re-evaluation of patient's condition.    Final Clinical Impressions(s) / ED Diagnoses   Final diagnoses:  Arterial occlusion [facility])    New Prescriptions New Prescriptions   No medications on file     Everlene Balls, MD 01/09/17 [phone]

## 2017-01-09 NOTE — Brief Op Note (Signed)
01/09/2017  11:58 AM  PATIENT:  [name]  66 y.o. female  PRE-OPERATIVE DIAGNOSIS:  Ischemic Right Leg   POST-OPERATIVE DIAGNOSIS:  Ischemic Right Leg  PROCEDURE:  Procedure(s): THROMBECTOMY OF RIGHT FEMORAL-POPLITEAL  ARTERY BYPASS GRAFT; THROMBECTOMY RIGHT  TIBIAL VESSELS (Right) PATCH ANGIOPLASTY RIGHT POPLITEAL ARTERY BYPASS GRAFT (Right)  SURGEON:  Surgeon(s) and Role:    * [name], MD - Primary

## 2017-01-09 NOTE — Progress Notes (Signed)
Patient belongings taken to PACU

## 2017-01-10 ENCOUNTER — Encounter (HOSPITAL_COMMUNITY): Payer: Medicare Other

## 2017-01-10 LAB — CBC
HCT: 30.9 % — ABNORMAL LOW (ref 36.0–46.0)
Hemoglobin: 8.9 g/dL — ABNORMAL LOW (ref 12.0–15.0)
MCH: 19.1 pg — ABNORMAL LOW (ref 26.0–34.0)
MCHC: 28.8 g/dL — AB (ref 30.0–36.0)
MCV: 66.3 fL — ABNORMAL LOW (ref 78.0–100.0)
Platelets: 496 10*3/uL — ABNORMAL HIGH (ref 150–400)
RBC: 4.66 MIL/uL (ref 3.87–5.11)
RDW: 22.1 % — ABNORMAL HIGH (ref 11.5–15.5)
WBC: 25 10*3/uL — AB (ref 4.0–10.5)

## 2017-01-10 LAB — BASIC METABOLIC PANEL
ANION GAP: 8 (ref 5–15)
BUN: 10 mg/dL (ref 6–20)
CHLORIDE: 105 mmol/L (ref 101–111)
CO2: 23 mmol/L (ref 22–32)
Calcium: 7.9 mg/dL — ABNORMAL LOW (ref 8.9–10.3)
Creatinine, Ser: 0.81 mg/dL (ref 0.44–1.00)
GFR calc non Af Amer: >60 mL/min (ref >60)
Glucose, Bld: 96 mg/dL (ref 65–99)
POTASSIUM: 3.9 mmol/L (ref 3.5–5.1)
Sodium: 136 mmol/L (ref 135–145)

## 2017-01-10 LAB — TROPONIN I

## 2017-01-10 LAB — APTT: APTT: 49 s — AB (ref 24–36)

## 2017-01-10 LAB — HEPARIN LEVEL (UNFRACTIONATED)
Heparin Unfractionated: <0.1 IU/mL — ABNORMAL LOW (ref 0.30–0.70)
Heparin Unfractionated: <0.1 IU/mL — ABNORMAL LOW (ref 0.30–0.70)

## 2017-01-10 MED ORDER — HEPARIN BOLUS VIA INFUSION
1500.0000 [IU] | Freq: Once | INTRAVENOUS | Status: AC
  Administered 2017-01-10: 1500 [IU] via INTRAVENOUS
  Filled 2017-01-10: qty 1500

## 2017-01-10 NOTE — Progress Notes (Signed)
ANTICOAGULATION CONSULT NOTE - Initial Consult  Pharmacy Consult for Heparin Indication: Possible RLE arterial occlusion   Patient Measurements: Height: 5' 5.5" (166.4 cm) Weight: 147 lb 14.9 oz (67.1 kg) IBW/kg (Calculated) : 58.15  Vital Signs: Temp: 98.7 F (37.1 C) (01/21 1127) Temp Source: Oral (01/21 1127) BP: 140/71 (01/21 1127) Pulse Rate: 93 (01/21 1127)   Medical History: Past Medical History:  Diagnosis Date  . Arthritis    "fingers, some in my knees" (01/28/2016)  . CAD (coronary artery disease), per cath 04/27/13, non obstructive disease, EF 65-70% 05/11/2013  . Carotid disease, bilateral ([place]) 09/2013   Lt carotid 50-69% stentosis, less on rt  . Dysrhythmia   . Fatty liver   . GERD (gastroesophageal reflux disease)   . Glaucoma   . H/O cardiovascular stress test 12/27/2009   negative for ischemia  . Headache    "occasional since eye pressure regulated" (01/28/2016)  . Hyperlipidemia   . Hypertension   . Leukocytosis 07/15/2015  . Migraine    "stopped w/laser holes to relieve pressure in my eyes; had them 3-4 times/wk before taht" (01/28/2016)  . Mitral regurgitation,2+ by cath 05/11/2013  . NSTEMI (non-ST elevated myocardial infarction) ([place]) 04/27/2013  . Pain    BOTH KNEES - PT HAS TORN MENISCUS LEFT KNEE  . Paroxysmal atrial fibrillation (HCC)    hx of a fib on ASA  AND ELIQUIS   . Polycythemia vera ([place]) 08/20/2015   JAK-2 positive 07/19/15  . PVD (peripheral vascular disease) with claudication ([place]) 07/2006   previous L ext iliac stent and rt SFA stent, known occluded Lt SFA  . S/P cardiac cath 04/27/13   NON OBSTRUCTIVE DISEASE, 2+ mr, ef 65-70%  . Statin intolerance   . Stroke ([place]) 2006   SWELLING ALL OVER AND RT SIDE OF FACE DRAWN AND SPEECH SLURRED AND NUMBNESS ON RIGHT SIDE-- ALL RESOLVED   Assessment: 66 y/o F with hx PVD and a fib who was on Apixaban but has NOT taken any in the last two months due to prescription running out. She has been taking full  dose aspirin in it's place. She was taken for thrombectomy of right femoral-popliteal artery bypass graft on 1/20.  Confirmatory Heparin level drawn evening is again undetectable after being therapeutic with the first reading. Spoke with RN, no issues with infusion seen but she is switching IV infusion site.   Hemoglobin decreased from 11.5 to 8.9 today, platelet count remains elevated. Some hematoma in calf noted by MD. Monitor this closely.   Will continue with same rate and check another 6-hour heparin level.   Goal of Therapy:  Heparin level 0.3-0.7 units/ml Monitor platelets by anticoagulation protocol: Yes   Plan:  -Change infusion site -Continue heparin drip at 1200 units/hr -Heparin level at 2130 -Daily CBC/HL -Monitor for bleeding   Demetrius Charity, PharmD Acute Care Pharmacy Resident  Pager: [phone] 01/10/2017

## 2017-01-10 NOTE — Progress Notes (Addendum)
Progress Note    01/10/2017 10:34 AM 1 Day Post-Op  Subjective:  Says she feels better this morning  Tm 99 now afebrile HR  70's-90's NSR [id number] systolic [id number] RA  Gtts:  heparin  Vitals:   01/10/17 0600 01/10/17 0719  BP: (!) 132/53 (!) 162/71  Pulse: 84 91  Resp: 20 (!) 31  Temp:  98.3 F (36.8 C)    Physical Exam: Cardiac:  regular Lungs:  Non labored Incisions:  Dressing removed-4x4 stuck to the dermabond; otherwise, incision looks fine. Proximal incision with dermabond spread with bloody ooze when placed.  Some bloody ooze on dressing around drain. Extremities:  + palpable right PT pulse and doppler signals right AT/peroneal; mild tenderness in right calf to palpation.  Motor and sensory in right foot are in tact.     CBC    Component Value Date/Time   WBC 25.0 (H) 01/10/2017 0009   RBC 4.66 01/10/2017 0009   HGB 8.9 (L) 01/10/2017 0009   HCT 30.9 (L) 01/10/2017 0009   HCT 36.9 09/30/2016 1019   PLT 496 (H) 01/10/2017 0009   PLT 395 (H) 09/30/2016 1019   MCV 66.3 (L) 01/10/2017 0009   MCV 64 (L) 09/30/2016 1019   MCH 19.1 (L) 01/10/2017 0009   MCHC 28.8 (L) 01/10/2017 0009   RDW 22.1 (H) 01/10/2017 0009   RDW 21.1 (H) 09/30/2016 1019   LYMPHSABS 1.1 01/09/2017 0450   LYMPHSABS 1.3 09/30/2016 1019   MONOABS 0.8 01/09/2017 0450   EOSABS 0.3 01/09/2017 0450   EOSABS 0.5 (H) 09/30/2016 1019   BASOSABS 0.3 (H) 01/09/2017 0450   BASOSABS 0.2 09/30/2016 1019    BMET    Component Value Date/Time   NA 136 01/10/2017 0630   K 3.9 01/10/2017 0630   CL 105 01/10/2017 0630   CO2 23 01/10/2017 0630   GLUCOSE 96 01/10/2017 0630   BUN 10 01/10/2017 0630   CREATININE 0.81 01/10/2017 0630   CREATININE 1.33 (H) 03/03/2016 1531   CALCIUM 7.9 (L) 01/10/2017 0630   GFRNONAA >60 01/10/2017 0630   GFRAA >60 01/10/2017 0630    INR    Component Value Date/Time   INR 1.07 01/09/2017 0450     Intake/Output Summary (Last 24 hours) at 01/10/17 1034 Last  data filed at 01/10/17 1000  Gross per 24 hour  Intake          1222.57 ml  Output           1712.5 ml  Net          -489.93 ml   JP drain output:  125cc/24 hrs  Assessment:  66 y.o. female is s/p:  thrombectomy of right femoral to popliteal bypass graft and tibial vessels with patch angioplasty  1 Day Post-Op  Plan: -pt feeling better this morning  -she does have a palpable right PT pulse and doppler signal right AT/peroneal. -motor and sensation are in tact -acute surgical blood loss anemia-tolerating.  JP drain with 125cc/24hr out.  There was clot in the drain that I was able to get out.  Will leave drain one more day and remove tomorrow.  -leukocytosis and thrombocytosis due to polycythemia vera. -worked with PT last evening - she walked to the sink and on the way back, vomited. Feeling better this morning without nausea.  Advance diet -troponin levels are all < 0.03 since initial lab.  She states that sitting up makes the pain better.  Most likely musculoskeletal.  -DVT prophylaxis:  Heparin gtt  Leontine Locket, PA-C [facility] [phone] 01/10/2017 10:34 AM  No evidence of compartment syndrome but does have some calf hematoma D/c drain today Continue heparin for now Eliquis if hematoma not worse tomorrow Vascular exam as above PT pulse otherwise doppler signals, clinically she feels better Transfer 2w  Ruta Hinds, MD [facility] [place]: [phone] Pager: [phone]

## 2017-01-10 NOTE — Progress Notes (Signed)
ANTICOAGULATION CONSULT NOTE - Initial Consult  Pharmacy Consult for Heparin Indication: Possible RLE arterial occlusion   Patient Measurements: Height: 5\' 5"  (165.1 cm) Weight: 141 lb 1.5 oz (64 kg) IBW/kg (Calculated) : 57  Vital Signs: Temp: 98.4 F (36.9 C) (01/21 2100) Temp Source: Oral (01/21 2100) BP: 164/54 (01/21 2100) Pulse Rate: 88 (01/21 2100)   Medical History: Past Medical History:  Diagnosis Date  . Arthritis    "fingers, some in my knees" (01/28/2016)  . CAD (coronary artery disease), per cath 04/27/13, non obstructive disease, EF 65-70% 05/11/2013  . Carotid disease, bilateral ([place]) 09/2013   Lt carotid 50-69% stentosis, less on rt  . Dysrhythmia   . Fatty liver   . GERD (gastroesophageal reflux disease)   . Glaucoma   . H/O cardiovascular stress test 12/27/2009   negative for ischemia  . Headache    "occasional since eye pressure regulated" (01/28/2016)  . Hyperlipidemia   . Hypertension   . Leukocytosis 07/15/2015  . Migraine    "stopped w/laser holes to relieve pressure in my eyes; had them 3-4 times/wk before taht" (01/28/2016)  . Mitral regurgitation,2+ by cath 05/11/2013  . NSTEMI (non-ST elevated myocardial infarction) ([place]) 04/27/2013  . Pain    BOTH KNEES - PT HAS TORN MENISCUS LEFT KNEE  . Paroxysmal atrial fibrillation (HCC)    hx of a fib on ASA  AND ELIQUIS   . Polycythemia vera ([place]) 08/20/2015   JAK-2 positive 07/19/15  . PVD (peripheral vascular disease) with claudication ([place]) 07/2006   previous L ext iliac stent and rt SFA stent, known occluded Lt SFA  . S/P cardiac cath 04/27/13   NON OBSTRUCTIVE DISEASE, 2+ mr, ef 65-70%  . Statin intolerance   . Stroke ([place]) 2006   SWELLING ALL OVER AND RT SIDE OF FACE DRAWN AND SPEECH SLURRED AND NUMBNESS ON RIGHT SIDE-- ALL RESOLVED   Assessment: 66 y/o F with hx PVD and a fib who was on Apixaban but has NOT taken any in the last two months due to prescription running out. She has been taking full dose  aspirin in it's place. She was taken for thrombectomy of right femoral-popliteal artery bypass graft on 1/20.  Multiple confirmatory heparin levels have been subtherapeutic despite rate increases. RN confirmed IV is and has been infusing all day with no issues. IV site was changed earlier today. Patient previously therapeutic on 1350 units/hr. Will avoid bolus given oozing around wound and hematoma on leg.   Goal of Therapy:  Heparin level 0.3-0.7 units/ml Monitor platelets by anticoagulation protocol: Yes   Plan:  -Increase heparin gtt to 1400 units/hr -Daily CBC/heparin level -Monitor for s/sx of bleeding   Georga Bora, PharmD Clinical Pharmacist Pager: [phone] 01/10/2017 10:02 PM

## 2017-01-10 NOTE — Progress Notes (Addendum)
ANTICOAGULATION CONSULT NOTE - Initial Consult  Pharmacy Consult for Heparin Indication: Possible RLE arterial occlusion   Patient Measurements: Height: 5' 5.5" (166.4 cm) Weight: 147 lb 14.9 oz (67.1 kg) IBW/kg (Calculated) : 58.15  Vital Signs: Temp: 99 F (37.2 C) (01/21 0336) Temp Source: Oral (01/21 0336) BP: 132/53 (01/21 0600) Pulse Rate: 84 (01/21 0600)   Medical History: Past Medical History:  Diagnosis Date  . Arthritis    "fingers, some in my knees" (01/28/2016)  . CAD (coronary artery disease), per cath 04/27/13, non obstructive disease, EF 65-70% 05/11/2013  . Carotid disease, bilateral ([place]) 09/2013   Lt carotid 50-69% stentosis, less on rt  . Dysrhythmia   . Fatty liver   . GERD (gastroesophageal reflux disease)   . Glaucoma   . H/O cardiovascular stress test 12/27/2009   negative for ischemia  . Headache    "occasional since eye pressure regulated" (01/28/2016)  . Hyperlipidemia   . Hypertension   . Leukocytosis 07/15/2015  . Migraine    "stopped w/laser holes to relieve pressure in my eyes; had them 3-4 times/wk before taht" (01/28/2016)  . Mitral regurgitation,2+ by cath 05/11/2013  . NSTEMI (non-ST elevated myocardial infarction) ([place]) 04/27/2013  . Pain    BOTH KNEES - PT HAS TORN MENISCUS LEFT KNEE  . Paroxysmal atrial fibrillation (HCC)    hx of a fib on ASA  AND ELIQUIS   . Polycythemia vera ([place]) 08/20/2015   JAK-2 positive 07/19/15  . PVD (peripheral vascular disease) with claudication ([place]) 07/2006   previous L ext iliac stent and rt SFA stent, known occluded Lt SFA  . S/P cardiac cath 04/27/13   NON OBSTRUCTIVE DISEASE, 2+ mr, ef 65-70%  . Statin intolerance   . Stroke ([place]) 2006   SWELLING ALL OVER AND RT SIDE OF FACE DRAWN AND SPEECH SLURRED AND NUMBNESS ON RIGHT SIDE-- ALL RESOLVED   Assessment: 66 y/o F with hx PVD and a fib who was on Apixaban but has NOT taken any in the last two months due to prescription running out. She has been taking full dose  aspirin in it's place. She was taken for thrombectomy of right femoral-popliteal artery bypass graft on 1/20.  Confirmatory Heparin level drawn evening is again undetectable after being therapeutic with the  First reading. Spoke with RN, no issues with infusion. Spoke with patient and no signs of bleeding. Hemoglobin decreased from 11.5 to 8.9 today, platelet count remains elevated. Will monitor this closely.   Will re-bolus and increase rate, then check another 6-hour heparin level.   Goal of Therapy:  Heparin level 0.3-0.7 units/ml Monitor platelets by anticoagulation protocol: Yes   Plan:  -Bolus Heparin 1500 units IV x1  -Increase heparin drip to 1200 units/hr -Heparin level at 1400 -Daily CBC/HL -Monitor for bleeding   Demetrius Charity, PharmD Acute Care Pharmacy Resident  Pager: [phone] 01/10/2017

## 2017-01-11 ENCOUNTER — Inpatient Hospital Stay (HOSPITAL_COMMUNITY): Payer: Medicare Other

## 2017-01-11 ENCOUNTER — Other Ambulatory Visit (INDEPENDENT_AMBULATORY_CARE_PROVIDER_SITE_OTHER): Payer: Self-pay | Admitting: *Deleted

## 2017-01-11 DIAGNOSIS — Z86010 Personal history of colonic polyps: Secondary | ICD-10-CM

## 2017-01-11 DIAGNOSIS — K562 Volvulus: Secondary | ICD-10-CM

## 2017-01-11 DIAGNOSIS — I998 Other disorder of circulatory system: Secondary | ICD-10-CM

## 2017-01-11 DIAGNOSIS — Z5309 Procedure and treatment not carried out because of other contraindication: Secondary | ICD-10-CM

## 2017-01-11 LAB — CBC
HEMATOCRIT: 27.4 % — AB (ref 36.0–46.0)
HEMOGLOBIN: 7.8 g/dL — AB (ref 12.0–15.0)
MCH: 18.9 pg — ABNORMAL LOW (ref 26.0–34.0)
MCHC: 28.5 g/dL — ABNORMAL LOW (ref 30.0–36.0)
MCV: 66.3 fL — ABNORMAL LOW (ref 78.0–100.0)
Platelets: 405 10*3/uL — ABNORMAL HIGH (ref 150–400)
RBC: 4.13 MIL/uL (ref 3.87–5.11)
RDW: 21.9 % — ABNORMAL HIGH (ref 11.5–15.5)
WBC: 19.8 10*3/uL — AB (ref 4.0–10.5)

## 2017-01-11 LAB — HEPARIN LEVEL (UNFRACTIONATED)

## 2017-01-11 MED ORDER — APIXABAN 5 MG PO TABS
5.0000 mg | ORAL_TABLET | Freq: Two times a day (BID) | ORAL | Status: DC
  Administered 2017-01-11 – 2017-01-12 (×2): 5 mg via ORAL
  Filled 2017-01-11 (×2): qty 1

## 2017-01-11 NOTE — Progress Notes (Signed)
Physical Therapy Treatment Patient Details Name: [name] MRN: [id number] DOB: 13-Oct-1951 Today's Date: 01/11/2017    History of Present Illness Pt is a 66 y/o female s/p thrombectomy of R fem-pop. PMH including but not limited to R fem-pop (01/31/16), CAD, CVA (2006), HTN, NSTEMI (04/2013), PAF, and PVD.    PT Comments    Gait distance limited by pain and weakness. Pt demonstrates self-limiting behavior. PT to encourage increased mobility.  Follow Up Recommendations  No PT follow up;Supervision for mobility/OOB     Equipment Recommendations  None recommended by PT    Recommendations for Other Services       Precautions / Restrictions Precautions Precautions: None Restrictions RLE Weight Bearing: Weight bearing as tolerated    Mobility  Bed Mobility Overal bed mobility: Modified Independent             General bed mobility comments: increased time  Transfers   Equipment used: Rolling walker (2 wheeled) Transfers: Sit to/from [employer] Sit to Stand: Supervision Stand pivot transfers: Supervision       General transfer comment: increased time, pt demo safe technique.  Ambulation/Gait Ambulation/Gait assistance: Supervision Ambulation Distance (Feet): 65 Feet Assistive device: Rolling walker (2 wheeled) Gait Pattern/deviations: Step-to pattern;Antalgic;Decreased stride length Gait velocity: decreased Gait velocity interpretation: Below normal speed for age/gender General Gait Details: Pt walking on forefoot on right. Unable to tolerate heel strike/foot flat due to pain. Pt fatigues quickly with ambulation. Steady gait.   Stairs            Wheelchair Mobility    Modified Rankin (Stroke Patients Only)       Balance   Sitting-balance support: Feet supported;No upper extremity supported Sitting balance-Leahy Scale: Good     Standing balance support: During functional activity;Bilateral upper extremity supported Standing  balance-Leahy Scale: Fair                      Cognition Arousal/Alertness: Awake/alert Behavior During Therapy: WFL for tasks assessed/performed Overall Cognitive Status: Within Functional Limits for tasks assessed                      Exercises      General Comments        Pertinent Vitals/Pain Pain Assessment: 0-10 Pain Score: 8  Pain Location: RLE with WB Pain Descriptors / Indicators: Sore Pain Intervention(s): Monitored during session;Repositioned    Home Living                      Prior Function            PT Goals (current goals can now be found in the care plan section) Acute Rehab PT Goals Patient Stated Goal: return home PT Goal Formulation: With patient Time For Goal Achievement: 01/23/17 Potential to Achieve Goals: Good Progress towards PT goals: Progressing toward goals    Frequency    Min 3X/week      PT Plan Current plan remains appropriate    Co-evaluation             End of Session Equipment Utilized During Treatment: Gait belt Activity Tolerance: Patient limited by pain Patient left: in chair;with call bell/phone within reach     Time: [id number] PT Time Calculation (min) (ACUTE ONLY): 19 min  Charges:  $Gait Training: 8-22 mins                    G Codes:  Lorriane Shire 01/11/2017, 9:18 AM

## 2017-01-11 NOTE — Progress Notes (Signed)
[facility] for Heparin Indication: Possible RLE arterial occlusion   Patient Measurements: Height: 5\' 5"  (165.1 cm) Weight: 141 lb 1.5 oz (64 kg) IBW/kg (Calculated) : 57  Vital Signs: Temp: 98.4 F (36.9 C) (01/21 2100) Temp Source: Oral (01/21 2100) BP: 156/68 (01/21 2231) Pulse Rate: 88 (01/21 2100)   Assessment: 66 y/o F with hx PVD and a fib who was on Apixaban but has NOT taken any in the last TWO MONTHS due to prescription running out. She has been taking full dose aspirin in it's place.   1/22: Heparin level remains low despite multiple rate increases, infusing good, Hgb down to 7.8, was having some oozing from surgical site yesterday but resolved per RN.  Goal of Therapy:  Heparin level 0.3-0.7 units/ml Monitor platelets by anticoagulation protocol: Yes   Plan:  -Inc heparin drip at 1600 units -1300 HL -Trend Hgb -Hopefully back to Apixaban today  Narda Bonds 01/11/2017,4:42 AM

## 2017-01-11 NOTE — Op Note (Signed)
Procedure: Thrombectomy right femoropopliteal bypass with thrombectomy of tibial vessels and patch angioplasty of below-knee popliteal artery, intraoperative arteriogram  Preoperative diagnosis: Acute ischemia right leg and her postoperative diagnosis: Same  Anesthesia: Gen.  Assistant: Leontine Locket PA-C  Indications: Patient is a 66 year old female who had a right femoral to below-knee popliteal bypass performed proximally one year ago with PTFE. Approximate 2 AM today she noticed coolness numbness and tingling in her right foot. This has rapidly progressed to the point that she is now having difficulty moving her toes.  Operative findings: #1 mild intimal hyperplasia distal anastomosis #2 small tibial vessels #3 bovine pericardial patch below-knee popliteal artery #4 no significant inflow issues #5 lesion Angio runoff via peroneal and anterior tibial artery with occlusion of the anterior tibial artery at the ankle  Operative details: After obtaining informed consent, the patient was taken to the operating room. After induction of general anesthesia and endotracheal intubation, the patient's entire right lower extremity was prepped and draped in the usual sterile fashion. A longitudinal incision was made her pre-existing scar below the knee on the right leg on the medial aspect. The incision was carried into the saphenous tissues. There is dense scar tissue in this area. Cautery was used to dissect down to the below-knee popliteal space. There were dense adhesions of the muscle to the PTFE graft. I was able to dissect the PTFE graft out circumferentially. It was well incorporated. It had no pulse within it. Dissection was then carried down to the below-knee popliteal artery. Again there were dense adhesions from the vein to the artery and several 6-0 Prolene sutures were used to make small repairs in the vein. The below-knee popliteal artery was dissected free circumferentially above the level of the  bypass graft as well as below. I also dissected out the origin of the anterior tibial artery and tibial peroneal trunk. Vessel loops were placed around all these. The patient had a ride in the operating room on a heparin drip. At this point the heparin drip was discontinued and the patient given a bolus of 7000 units of intravenous heparin. A longitudinal opening was made in the hood of the graft right at the anastomosis. A #4 Fogarty catheter was then passed proximally up the graft several times until all thrombus was removed and there was excellent arterial inflow. 2 clean passes were obtained. I then proceeded to pass a #3 Fogarty catheter down the anterior tibial and tibial peroneal trunk multiple passes. On the anterior tibial artery was able to pass this down to 30 cm. A large amount of thrombus was retrieved. There was some backbleeding from the Angio tibial artery at this point. 2 clean passes were obtained. This was thoroughly flushed with heparinized saline and then controlled again with a vessel loop. Vessels adequately I extended the initial arteriotomy down onto the below-knee popliteal artery past the initial anastomosis. I then also passed a #3 Fogarty catheter down the tibioperoneal trunk. At 1.1 the balloons ruptured on the 30 catheter. As much of the catheter was retrieved as possible and I was confident that all of this had been retrieved. A #3 Fogarty catheter was then passed several more times down the tibioperoneal trunk to make sure that there was no further debris from the balloon ruptured. There was some backbleeding from this vessel as well as this point next a bovine pericardial patch was brought up in the operative field and sewn on as a patch angioplasty using a running 6-0 Prolene suture.  Just prior completion anastomosis was forebled backbled and thoroughly flushed. Anastomosis was secured clamps released and a few repair stitches were placed in the distal portion of the patch to  obtain hemostasis. Intraoperative arteriogram was obtained by introducing a 21-gauge butterfly needle into the proximal patch. This was done with inflow occlusion. Showed patent origins of the anterior tibial and tibioperoneal trunk. There was some disruption of flow in the mid peroneal artery suggestive of some extravasation. However there was flow in the peroneal all the way to level foot. The anterior tibial artery was patent all the way to the level of the ankle and occludes at the ankle. This point I did not feel I could make the patient's flow any better than he currently was. The patient did not initially have Doppler signals in the dorsalis pedis or anterior tibial area of the foot. The patient did have a monophasic posterior tibial Doppler signal. Hemostasis was obtained with Avitene. A 10 flat Jackson-Pratt drain was brought out through a separate stab incision below the incision and sutured to the skin. The incision was then closed in multiple layers of running 201 3-0 Vicryl suture followed by 4 Vicryl subcuticular stitch in the skin. The patient our procedure well and there were no complications. The ansa and sponge and needle count was correct in the case. The patient was taken to the recovery room in stable condition.  Ruta Hinds, MD [facility] Office: [phone] Pager: [phone]

## 2017-01-11 NOTE — Evaluation (Signed)
Occupational Therapy Evaluation Patient Details Name: [name] MRN: [id number] DOB: 1951/07/04 Today's Date: 01/11/2017    History of Present Illness Pt is a 66 y/o female s/p thrombectomy of R fem-pop. PMH including but not limited to R fem-pop (01/31/16), CAD, CVA (2006), HTN, NSTEMI (04/2013), PAF, and PVD.   Clinical Impression   Pt admitted with the above diagnosis and has the deficits listed below. Pt would benefit from cont OT to increase independence with basic adl transfers and toileting. Pt has a friend that can stay with her 24/7 for a few weeks and also has her granddaughter available to assist outside of school hours.  Do not feel this pt needs OT services at home.    Follow Up Recommendations  No OT follow up;Supervision - Intermittent    Equipment Recommendations  None recommended by OT    Recommendations for Other Services       Precautions / Restrictions Precautions Precautions: None Restrictions Weight Bearing Restrictions: Yes RLE Weight Bearing: Weight bearing as tolerated      Mobility Bed Mobility Overal bed mobility: Modified Independent             General bed mobility comments: increased time  Transfers Overall transfer level: Needs assistance Equipment used: Rolling walker (2 wheeled) Transfers: Sit to/from [employer] Transfers Sit to Stand: Supervision Stand pivot transfers: Supervision       General transfer comment: increased time, pt demo safe technique.    Balance Overall balance assessment: Needs assistance Sitting-balance support: Feet supported;No upper extremity supported Sitting balance-Leahy Scale: Good     Standing balance support: During functional activity;Bilateral upper extremity supported Standing balance-Leahy Scale: Fair Standing balance comment: Pt must have RW to walk but is able to let go of walker safely in standing to fasten/pull up pants.                            ADL Overall  ADL's : Needs assistance/impaired Eating/Feeding: Independent;Sitting   Grooming: Wash/dry hands;Wash/dry face;Oral care;Supervision/safety;Standing   Upper Body Bathing: Set up;Sitting   Lower Body Bathing: Supervison/ safety;Sit to/from stand   Upper Body Dressing : Set up;Sitting   Lower Body Dressing: Minimal assistance;Sit to/from stand Lower Body Dressing Details (indicate cue type and reason): Pt can dress self full from bed level sitting in long sitting. Pt requires min assist to reach RLE when sitting on EOB for donning sock and shoe. Pt donned underwear and pants without assist on EOB. Toilet Transfer: Supervision/safety;RW;Ambulation;Comfort height toilet Toilet Transfer Details (indicate cue type and reason): Pt can transfers to [facility] without assist but does require S to walk to bathroom and use the toilet for safety. Toileting- Clothing Manipulation and Hygiene: Supervision/safety;Sit to/from stand     Tub/Shower Transfer Details (indicate cue type and reason): talked at length about tranferring to her tub/shower with a 3:1 commode.  Pt needs to get approval to get in shower.  Until then, she is independent sponge bathing at sink. Functional mobility during ADLs: Supervision/safety;Rolling walker General ADL Comments: Pt does very well with adls and does well problem solving how to do things differently so she can be more independent.      Vision Vision Assessment?: No apparent visual deficits   Perception     Praxis      Pertinent Vitals/Pain Pain Assessment: Faces Pain Score: 8  Faces Pain Scale: Hurts even more Pain Location: RLE with WB Pain Descriptors / Indicators: Sore  Pain Intervention(s): Limited activity within patient's tolerance;Monitored during session;Repositioned     Hand Dominance Right   Extremity/Trunk Assessment Upper Extremity Assessment Upper Extremity Assessment: Overall WFL for tasks assessed   Lower Extremity Assessment Lower Extremity  Assessment: Defer to PT evaluation   Cervical / Trunk Assessment Cervical / Trunk Assessment: Normal   Communication Communication Communication: No difficulties   Cognition Arousal/Alertness: Awake/alert Behavior During Therapy: WFL for tasks assessed/performed Overall Cognitive Status: Within Functional Limits for tasks assessed                     General Comments       Exercises       Shoulder Instructions      Home Living Family/patient expects to be discharged to:: Private residence Living Arrangements: Other relatives Available Help at Discharge: Family;Friend(s);Available PRN/intermittently Type of Home: House Home Access: Stairs to enter [employer] of Steps: 4 Entrance Stairs-Rails: Right;Left Home Layout: One level     Bathroom Shower/Tub: Tub/shower unit Shower/tub characteristics: Curtain Biochemist, clinical: Standard     Home Equipment: Environmental consultant - 2 wheels;Bedside commode   Additional Comments: pt uses BSC in tub shower and states she can sit on BSC and then swing legs into tub from there.       Prior Functioning/Environment Level of Independence: Independent        Comments: has 2 grandchildren that can help her after they get out of school        OT Problem List: Impaired balance (sitting and/or standing);Decreased knowledge of use of DME or AE;Pain   OT Treatment/Interventions: Self-care/ADL training;Therapeutic activities    OT Goals(Current goals can be found in the care plan section) Acute Rehab OT Goals Patient Stated Goal: return home OT Goal Formulation: With patient Time For Goal Achievement: 01/18/17 Potential to Achieve Goals: Good ADL Goals Additional ADL Goal #1: Pt will walk to bathroom with RW and complete all toileting tasks with Mod I. Additional ADL Goal #2: Pt will complete simulated tub transfer using 3:1 commode to simulate transfer into to tub at home with S.   OT Frequency: Min 2X/week   Barriers to  D/C:            Co-evaluation              End of Session Equipment Utilized During Treatment: Rolling walker Nurse Communication: Mobility status  Activity Tolerance: Patient tolerated treatment well Patient left: in bed;with call bell/phone within reach   Time: 1215-1232 OT Time Calculation (min): 17 min Charges:  OT General Charges $OT Visit: 1 Procedure OT Evaluation $OT Eval Moderate Complexity: 1 Procedure G-Codes:    Glenford Peers 02-05-2017, 12:43 PM  [phone]

## 2017-01-11 NOTE — Progress Notes (Signed)
VASCULAR LAB PRELIMINARY  ARTERIAL  ABI completed: Right ABI of 0.59 and left ABI of 0.6 is suggestive of moderate arterial occlusive disease at rest.   RIGHT    LEFT    PRESSURE WAVEFORM  PRESSURE WAVEFORM  BRACHIAL 161 Triphasic BRACHIAL 176 Triphasic  DP 82 Monophasic DP 73 Monophasic  PT 104 Monophasic PT 105 Monophasic    RIGHT LEFT  ABI 0.59 0.6     Legrand Como, RVT 01/11/2017, 2:13 PM

## 2017-01-11 NOTE — Progress Notes (Signed)
ANTICOAGULATION CONSULT NOTE -  Pharmacy Consult for Heparin Indication: Possible RLE arterial occlusion   Patient Measurements: Height: 5\' 5"  (165.1 cm) Weight: 141 lb 1.5 oz (64 kg) IBW/kg (Calculated) : 57  Vital Signs: Temp: 98.8 F (37.1 C) (01/22 1306) Temp Source: Oral (01/22 1306) BP: 163/62 (01/22 1306) Pulse Rate: 93 (01/22 1306)    Assessment: 66 y/o F with hx PVD and a fib who was on Apixaban but has NOT taken any in the last two months due to prescription running out. She has been taking full dose aspirin in it's place. She was taken for thrombectomy of right femoral-popliteal artery bypass graft on 1/20. She is noted with oozing from the incision and drain site. Post-op anemia noted  Transitioning back to [place] today -Hg= 7.8, plt= 405   Goal of Therapy:  Heparin level 0.3-0.7 units/ml Monitor platelets by anticoagulation protocol: Yes   Plan:  -Resume apixiban 5mg  po bid -Discontinue heparin   Hildred Laser, Pharm D 01/11/2017 2:01 PM

## 2017-01-11 NOTE — Progress Notes (Addendum)
Progress Note    01/11/2017 7:26 AM 2 Days Post-Op  Subjective:  Says she has some oozing from the incision and they just changed the bandage.  Did not walk bc no one ever came to walk with her.  She says she has been getting up to the bedside commode.   Tm 99.1 HR 80's-90's NSR [id number] systolic 0000000 RA  Vitals:   01/10/17 2231 01/11/17 0538  BP: (!) 156/68 (!) 162/62  Pulse:  97  Resp:  18  Temp:  99.1 F (37.3 C)    Physical Exam: Cardiac:  regular Lungs:  Non labored Incisions:  Slight bloody ooze from proximal incision and drain site. Extremities:  Easily palpable right PT pulse.  Right foot is warm and motor and sensation are in tact.    CBC    Component Value Date/Time   WBC 19.8 (H) 01/11/2017 0255   RBC 4.13 01/11/2017 0255   HGB 7.8 (L) 01/11/2017 0255   HCT 27.4 (L) 01/11/2017 0255   HCT 36.9 09/30/2016 1019   PLT 405 (H) 01/11/2017 0255   PLT 395 (H) 09/30/2016 1019   MCV 66.3 (L) 01/11/2017 0255   MCV 64 (L) 09/30/2016 1019   MCH 18.9 (L) 01/11/2017 0255   MCHC 28.5 (L) 01/11/2017 0255   RDW 21.9 (H) 01/11/2017 0255   RDW 21.1 (H) 09/30/2016 1019   LYMPHSABS 1.1 01/09/2017 0450   LYMPHSABS 1.3 09/30/2016 1019   MONOABS 0.8 01/09/2017 0450   EOSABS 0.3 01/09/2017 0450   EOSABS 0.5 (H) 09/30/2016 1019   BASOSABS 0.3 (H) 01/09/2017 0450   BASOSABS 0.2 09/30/2016 1019    BMET    Component Value Date/Time   NA 136 01/10/2017 0630   K 3.9 01/10/2017 0630   CL 105 01/10/2017 0630   CO2 23 01/10/2017 0630   GLUCOSE 96 01/10/2017 0630   BUN 10 01/10/2017 0630   CREATININE 0.81 01/10/2017 0630   CREATININE 1.33 (H) 03/03/2016 1531   CALCIUM 7.9 (L) 01/10/2017 0630   GFRNONAA >60 01/10/2017 0630   GFRAA >60 01/10/2017 0630    INR    Component Value Date/Time   INR 1.07 01/09/2017 0450     Intake/Output Summary (Last 24 hours) at 01/11/17 0726 Last data filed at 01/11/17 0000  Gross per 24 hour  Intake           574.47 ml  Output             802.5 ml  Net          -228.03 ml     Assessment:  66 y.o. female is s/p:  thrombectomy of right femoral to popliteal bypass graft and tibial vessels with patch angioplasty   2 Days Post-Op  Plan: -pt doing well this am with palpable right PT pulse -motor and sensation in tact -some oozing from proximal incision and drain site.  Acute surgical blood loss anemia-hgb dropped from 8.9 to 7.8.  Her BP is tolerating.  EF in 2014 55-60%. -encouraged ambulation.  She says she did not walk yesterday bc no one came to walk with her.   -DVT prophylaxis:  Heparin gtt-most likely will change over to Eliquis today   Leontine Locket, PA-C [facility] [phone] 01/11/2017 7:26 AM  Agree with above.  Calf softer today.  Right foot warm.  Start Eliquis d/c heparin Probably d/c home tomorrow.  Leukocytosis secondary to [place], MD [facility] [name] Office: [phone] Pager: [phone]

## 2017-01-11 NOTE — Telephone Encounter (Signed)
Patient is currently inpatient, [facility] cannot schedule virtual TCS until patient is discharged

## 2017-01-12 ENCOUNTER — Encounter (HOSPITAL_COMMUNITY): Payer: Self-pay | Admitting: Vascular Surgery

## 2017-01-12 LAB — CBC
HEMATOCRIT: 27.1 % — AB (ref 36.0–46.0)
Hemoglobin: 7.8 g/dL — ABNORMAL LOW (ref 12.0–15.0)
MCH: 18.9 pg — ABNORMAL LOW (ref 26.0–34.0)
MCHC: 28.8 g/dL — ABNORMAL LOW (ref 30.0–36.0)
MCV: 65.8 fL — ABNORMAL LOW (ref 78.0–100.0)
Platelets: 378 10*3/uL (ref 150–400)
RBC: 4.12 MIL/uL (ref 3.87–5.11)
RDW: 21.8 % — ABNORMAL HIGH (ref 11.5–15.5)
WBC: 20.5 10*3/uL — AB (ref 4.0–10.5)

## 2017-01-12 MED ORDER — TRAMADOL HCL 50 MG PO TABS: 50.0000 mg | ORAL_TABLET | Freq: Four times a day (QID) | ORAL | 0 refills | Status: DC | PRN

## 2017-01-12 MED ORDER — APIXABAN 5 MG PO TABS: 5.0000 mg | ORAL_TABLET | Freq: Two times a day (BID) | ORAL | 1 refills | Status: DC

## 2017-01-12 NOTE — Progress Notes (Addendum)
Progress Note    01/12/2017 7:39 AM 3 Days Post-Op  Subjective:  Feeling better but still sore.  Has walked in room, but not really the hallway.  Nausea better.  Afebrile HR  80's-90's NSR [id number] systolic [id number] RA  Vitals:   01/11/17 2133 01/12/17 0354  BP: (!) 155/64 (!) 174/69  Pulse: 88 90  Resp: 18 18  Temp: 98.9 F (37.2 C) 98.7 F (37.1 C)    Physical Exam: Cardiac:  regular Lungs:   Non labored Incisions:  Healing nicely Extremities:  Easily palpable PT pulse; motor/sensation are in tact.  CBC    Component Value Date/Time   WBC 20.5 (H) 01/12/2017 0303   RBC 4.12 01/12/2017 0303   HGB 7.8 (L) 01/12/2017 0303   HCT 27.1 (L) 01/12/2017 0303   HCT 36.9 09/30/2016 1019   PLT 378 01/12/2017 0303   PLT 395 (H) 09/30/2016 1019   MCV 65.8 (L) 01/12/2017 0303   MCV 64 (L) 09/30/2016 1019   MCH 18.9 (L) 01/12/2017 0303   MCHC 28.8 (L) 01/12/2017 0303   RDW 21.8 (H) 01/12/2017 0303   RDW 21.1 (H) 09/30/2016 1019   LYMPHSABS 1.1 01/09/2017 0450   LYMPHSABS 1.3 09/30/2016 1019   MONOABS 0.8 01/09/2017 0450   EOSABS 0.3 01/09/2017 0450   EOSABS 0.5 (H) 09/30/2016 1019   BASOSABS 0.3 (H) 01/09/2017 0450   BASOSABS 0.2 09/30/2016 1019    BMET    Component Value Date/Time   NA 136 01/10/2017 0630   K 3.9 01/10/2017 0630   CL 105 01/10/2017 0630   CO2 23 01/10/2017 0630   GLUCOSE 96 01/10/2017 0630   BUN 10 01/10/2017 0630   CREATININE 0.81 01/10/2017 0630   CREATININE 1.33 (H) 03/03/2016 1531   CALCIUM 7.9 (L) 01/10/2017 0630   GFRNONAA >60 01/10/2017 0630   GFRAA >60 01/10/2017 0630    INR    Component Value Date/Time   INR 1.07 01/09/2017 0450     Intake/Output Summary (Last 24 hours) at 01/12/17 0739 Last data filed at 01/11/17 1800  Gross per 24 hour  Intake              360 ml  Output              250 ml  Net              110 ml   ABI's 01/12/17:  RIGHT    LEFT    PRESSURE WAVEFORM  PRESSURE WAVEFORM  BRACHIAL 161  Triphasic BRACHIAL 176 Triphasic  DP 82 Monophasic DP 73 Monophasic  PT 104 Monophasic PT 105 Monophasic    RIGHT LEFT  ABI 0.59 0.6     Assessment:  66 y.o. female is s/p:  thrombectomy of right femoral to popliteal bypass graft and tibial vessels with patch angioplasty  3 Days Post-Op  Plan: -pt doing better this morning-continues to have palpable right PT pulse.  ABI's decreased from December. -pt wants to go home-will have PT see her this morning for home health needs.  She does have a walker and BSC at home from previous surgery. -DVT prophylaxis:  Eliquis -she does have an appt with Dr. [name] tomorrow, but given she does Medicaid transportation, she does not feel up to the trip.  She will reschedule with him.  Will give Eliquis for one month with one refill and refill after that per Dr. [name].     Leontine Locket, PA-C [facility] [phone] 01/12/2017 7:39 AM  Agree with above.  Palpable PT pulse, calf soft incision clean D/c home on Eliquis Emphasized to pt compliance with eliquis for life  Ruta Hinds, MD [facility] [place]: [phone] Pager: [phone]

## 2017-01-12 NOTE — Discharge Summary (Signed)
Discharge Summary    AAILA GRISSO 18-Jun-1951 66 y.o. female  [id number]  Admission Date: 01/09/2017  Discharge Date: 01/12/17  Physician: [name], MD  Admission Diagnosis: Arterial occlusion [facility]) [I74.9]   HPI:   This is a 66 y.o. female right fem bk pop with ptfe 2/17 by Dr Kellie Simmering with popliteal endarterectomy by Dr Scot Dock about 2 days later.  She was doing ok until about 2 am when she began to have pain in her right foot.  This has now progressed to numbness and inability to move her toes.  She was last seen in our office about a month ago and duplex showed no narrowing with abi 1.0 on right 0.8 on left.  She had been on Eliquis for paroxysmal afib and polycythemia but stopped this on her own in December when her prescription ran out. She has been taking aspirin. Other medical problems include CAD, moderate carotid stenosis, arthritis hypertension, elevated cholesterol all of which have been stable.  Her CAD is follow by Dr [name].  Her polycythemia is followed by Dr Beryle Beams.  Hospital Course:  The patient was admitted to the hospital and taken to the operating room on 01/09/2017 and underwent: Thrombectomy right femoropopliteal bypass with thrombectomy of tibial vessels and patch angioplasty of below-knee popliteal artery, intraoperative arteriogram    The pt tolerated the procedure well and was transported to the PACU in good condition.   That afternoon, she did have doppler signals present in the right foot.  She did have some complaints of chest pressure.  EKG and troponin was drawn.  The first troponin came back 0.03 and then serial troponins were drawn every 6 hours and were negative.  She did have some vomiting and was left npo for a while.   On POD 1, she was feeling better.  She did have a palpable right PT pulse and doppler signal in the right AT/peroneal.  Her JP drain was discontinued.  She did have leukocytosis and thrombocytosis due to polycythemia vera.   Her diet was advanced.  Her chest pressure was better and most likely musculoskeletal.  She was continued on a heparin gtt.  She did not have evidence of compartment syndrome.  She was transferred to [place].  On POD 2, she continued to have a palpable PT pulse on the right.  Motor and sensation are in tact.  She was started back on her Eliquis and heparin was discontinued.    ABI's on 01/11/17: RIGHT    LEFT     PRESSURE WAVEFORM  PRESSURE WAVEFORM  BRACHIAL 161 Triphasic BRACHIAL 176 Triphasic  DP 82 Monophasic DP 73 Monophasic  PT 104 Monophasic PT 105 Monophasic    RIGHT LEFT  ABI 0.59 0.6   It was emphasized to the pt to be compliant with Eliquis and this will be lifelong. She was discharged home.  The remainder of the hospital course consisted of increasing mobilization and increasing intake of solids without difficulty.  CBC    Component Value Date/Time   WBC 20.5 (H) 01/12/2017 0303   RBC 4.12 01/12/2017 0303   HGB 7.8 (L) 01/12/2017 0303   HCT 27.1 (L) 01/12/2017 0303   HCT 36.9 09/30/2016 1019   PLT 378 01/12/2017 0303   PLT 395 (H) 09/30/2016 1019   MCV 65.8 (L) 01/12/2017 0303   MCV 64 (L) 09/30/2016 1019   MCH 18.9 (L) 01/12/2017 0303   MCHC 28.8 (L) 01/12/2017 0303   RDW 21.8 (H) 01/12/2017 0303  RDW 21.1 (H) 09/30/2016 1019   LYMPHSABS 1.1 01/09/2017 0450   LYMPHSABS 1.3 09/30/2016 1019   MONOABS 0.8 01/09/2017 0450   EOSABS 0.3 01/09/2017 0450   EOSABS 0.5 (H) 09/30/2016 1019   BASOSABS 0.3 (H) 01/09/2017 0450   BASOSABS 0.2 09/30/2016 1019    BMET    Component Value Date/Time   NA 136 01/10/2017 0630   K 3.9 01/10/2017 0630   CL 105 01/10/2017 0630   CO2 23 01/10/2017 0630   GLUCOSE 96 01/10/2017 0630   BUN 10 01/10/2017 0630   CREATININE 0.81 01/10/2017 0630   CREATININE 1.33 (H) 03/03/2016 1531   CALCIUM 7.9 (L) 01/10/2017 0630   GFRNONAA >60 01/10/2017 0630   GFRAA >60 01/10/2017 0630      Discharge Instructions    Call MD for:   redness, tenderness, or signs of infection (pain, swelling, bleeding, redness, odor or green/yellow discharge around incision site)    Complete by:  As directed    Call MD for:  severe or increased pain, loss or decreased feeling  in affected limb(s)    Complete by:  As directed    Call MD for:  temperature >100.5    Complete by:  As directed    Discharge wound care:    Complete by:  As directed    Shower daily with soap and water starting 01/13/17   Driving Restrictions    Complete by:  As directed    No driving for 2 weeks   Lifting restrictions    Complete by:  As directed    No lifting for 2 weeks   Resume previous diet    Complete by:  As directed       Discharge Diagnosis:  Arterial occlusion ([place]) [I74.9]  Secondary Diagnosis: Patient Active Problem List   Diagnosis Date Noted  . Thrombosis of right femoral artery ([place]) 01/09/2017  . Ischemia of extremity 01/09/2017  . History of colonic polyps 07/30/2016  . Essential hypertension 01/08/2016  . Polycythemia vera ([place]) 08/20/2015  . Leukocytosis 07/15/2015  . S/P arterial stent right SFA overlapping Bahn covered stents 06/27/2015 06/28/2015  . Claudication ([place]) 06/27/2015  . Carotid artery disease ([place]) 08/24/2014  . Osteoarthritis of left knee 03/27/2014  . External hemorrhoids 06/06/2013  . PAF (paroxysmal atrial fibrillation), maintaing SR 05/11/2013  . Chronic anticoagulation, new- eliquis 05/11/2013  . CAD (coronary artery disease), per cath 04/27/13, non obstructive disease 20-30% LM; LAD 30%; 50-60% OM2; RCA 40%; EF 65-70% 05/11/2013  . Mitral regurgitation,2+ by cath 05/11/2013  . Atrial fibrillation with RVR, hx of PAF previously 04/27/2013  . PAD (peripheral artery disease), Lt carotid 50-70%: , Hx stent Lt exteral iliac & RSFA stent, known occl. LSFA  04/27/2013  . Hyperlipidemia 04/27/2013  . History of CVA (cerebrovascular accident) 04/27/2013  . NSTEMI (non-ST elevated myocardial infarction) ([place])  04/27/2013   Past Medical History:  Diagnosis Date  . Arthritis    "fingers, some in my knees" (01/28/2016)  . CAD (coronary artery disease), per cath 04/27/13, non obstructive disease, EF 65-70% 05/11/2013  . Carotid disease, bilateral ([place]) 09/2013   Lt carotid 50-69% stentosis, less on rt  . Dysrhythmia   . Fatty liver   . GERD (gastroesophageal reflux disease)   . Glaucoma   . H/O cardiovascular stress test 12/27/2009   negative for ischemia  . Headache    "occasional since eye pressure regulated" (01/28/2016)  . Hyperlipidemia   . Hypertension   . Leukocytosis 07/15/2015  . Migraine    "  stopped w/laser holes to relieve pressure in my eyes; had them 3-4 times/wk before taht" (01/28/2016)  . Mitral regurgitation,2+ by cath 05/11/2013  . NSTEMI (non-ST elevated myocardial infarction) ([place]) 04/27/2013  . Pain    BOTH KNEES - PT HAS TORN MENISCUS LEFT KNEE  . Paroxysmal atrial fibrillation ([facility])    hx of a fib on ASA  AND ELIQUIS   . Polycythemia vera ([place]) 08/20/2015   JAK-2 positive 07/19/15  . PVD (peripheral vascular disease) with claudication ([place]) 07/2006   previous L ext iliac stent and rt SFA stent, known occluded Lt SFA  . S/P cardiac cath 04/27/13   NON OBSTRUCTIVE DISEASE, 2+ mr, ef 65-70%  . Statin intolerance   . Stroke ([place]) 2006   SWELLING ALL OVER AND RT SIDE OF FACE DRAWN AND SPEECH SLURRED AND NUMBNESS ON RIGHT SIDE-- ALL RESOLVED     Allergies as of 01/12/2017      Reactions   Clopidogrel Itching   Oxycodone-acetaminophen Itching   Prednisone Other (See Comments)   Muscle spasms   Statins Itching, Other (See Comments)   Simvastatin-caused severe itching Pravastatin-caused lesser tiching One type of statin? Caused stroke in 2006, and other symptoms as result   Venlafaxine Itching   Warfarin And Related Other (See Comments)   Caused nose bleeds   Esomeprazole Rash   Lactose Intolerance (gi) Other (See Comments)   Bloating, gas   Penicillins Hives, Other (See  Comments)   Questionable high fever when taken and broke out in whelps.    Simvastatin Itching, Rash   Also lipitor intolerant   Warfarin Other (See Comments)   nosebleeds   Atenolol Rash   Crestor [rosuvastatin] Itching, Rash   Demerol [meperidine] Other (See Comments)   Unknown, can't remember    Effexor [venlafaxine Hcl] Other (See Comments)   Doesn't remember    Inderal [propranolol] Other (See Comments)   Doesn't remember    Lipitor [atorvastatin] Rash   Lovaza [omega-3-acid Ethyl Esters] Other (See Comments)   Doesn't remember    Nexium [esomeprazole Magnesium] Rash   Percocet [oxycodone-acetaminophen] Other (See Comments)   Doesn't remember    Plavix [clopidogrel Bisulfate] Rash   Pravastatin Itching, Rash   Trilipix [choline Fenofibrate] Other (See Comments)   Doesn't remember       Medication List    TAKE these medications   acetaminophen 500 MG tablet Commonly known as:  TYLENOL Take 1,000 mg by mouth every 6 (six) hours as needed for moderate pain.   apixaban 5 MG Tabs tablet Commonly known as:  ELIQUIS Take 1 tablet (5 mg total) by mouth 2 (two) times daily. What changed:  See the new instructions.   aspirin EC 325 MG tablet Take 325 mg by mouth daily.   carboxymethylcellulose 0.5 % Soln Commonly known as:  REFRESH PLUS Place 1 drop into both eyes 3 (three) times daily as needed (dry eye).   diltiazem 180 MG 24 hr capsule Commonly known as:  CARDIZEM CD Take 180 mg by mouth every morning.   docusate sodium 100 MG capsule Commonly known as:  COLACE Take 200 mg by mouth every evening.   ezetimibe 10 MG tablet Commonly known as:  ZETIA TAKE ONE TABLET AT BEDTIME   fenofibrate 145 MG tablet Commonly known as:  TRICOR TAKE ONE TABLET AT BEDTIME   flecainide 50 MG tablet Commonly known as:  TAMBOCOR TAKE (1) TABLET TWICE A DAY.   lisinopril 5 MG tablet Commonly known as:  PRINIVIL,ZESTRIL Take 20  mg by mouth daily.   omeprazole 20 MG  capsule Commonly known as:  PRILOSEC Take 20 mg by mouth 2 (two) times daily before a meal.   polyvinyl alcohol 1.4 % ophthalmic solution Commonly known as:  LIQUIFILM TEARS Place 1 drop into both eyes as needed for dry eyes.   traMADol 50 MG tablet Commonly known as:  ULTRAM Take 1 tablet (50 mg total) by mouth every 6 (six) hours as needed. Currently: taking med between 2-3 times daily for pain What changed:  when to take this  reasons to take this       Prescriptions given: Tramadol#20 No Refill Eliquis 5mg  bid # 60 one RF (future refills by Dr. [name])  Instructions: 1.  No driving for 2 weeks and while taking pain medication 2.  No heavy lifting x 2 weeks 3.  Shower daily starting 01/13/17 4.  Will need to stay on Eliquis for life.  Disposition: home  Patient's condition: is Good  Follow up: 1. Dr. Oneida Alar in 2-3 weeks   Leontine Locket, PA-C [facility] [phone] 01/12/2017  1:55 PM

## 2017-01-12 NOTE — Care Management Note (Signed)
Case Management Note Marvetta Gibbons RN, [name] Unit 2W-Case Manager [phone]  Patient Details  Name: [name] MRN: [id number] Date of Birth: [birth year]  Subjective/Objective:  Pt Admitted with thrombosis- s/p thrombectomy and fempop bypass                  Action/Plan: PTA pt lived at home- per PT eval recommendation for HHPT- order placed- spoke with pt at bedside- choice offered for [facility] agency in [facility]- per pt she states she does not have a preference as long as agency takes insurance- referral called to [employer] with [facility]- referral accepted for HHPT. - per pt she has all needed DME that includes RW and BSC at home. She has also been on Eliquis in the past and is aware of copay cost-   Expected Discharge Date:  01/12/17               Expected Discharge Plan:  [place]  In-House Referral:     Discharge planning Services  CM Consult, Medication Assistance  Post Acute Care Choice:  Home Health Choice offered to:  Patient  DME Arranged:  N/A DME Agency:  NA  HH Arranged:  PT [place] Agency:  Other - See comment  Status of Service:  Completed, signed off  If discussed at [place] of Stay Meetings, dates discussed:    Discharge Disposition: Home with Home Health   Additional Comments:  Dawayne Patricia, RN 01/12/2017, 2:05 PM

## 2017-01-12 NOTE — Progress Notes (Signed)
Pt discharged in stable condition via wheelchair into the care of her daughter via private vehicle.  PIV (x2) removed intact w/o S&S of complications.  Discharge instructions reviewed with pt.  Pt verbalized understanding.  Prescription given to pt.

## 2017-01-12 NOTE — Progress Notes (Signed)
Physical Therapy Treatment Patient Details Name: [name] MRN: [id number] DOB: 1951-03-14 Today's Date: 01/12/2017    History of Present Illness Pt is a 66 y/o female s/p thrombectomy of R fem-pop. PMH including but not limited to R fem-pop (01/31/16), CAD, CVA (2006), HTN, NSTEMI (04/2013), PAF, and PVD.    PT Comments    Pt able to progress to hall ambulation, stairs and HEP today. Pt reports pain 6/10 with movement and tightness in right calf. Pt has been resting with right knee bent and educated for knee extension and elevation at rest, gait and HEP with pt stating understanding. Pt encouraged to continue to progress HEP and ambulation. Will follow.    Follow Up Recommendations  No PT follow up;Supervision for mobility/OOB     Equipment Recommendations  None recommended by PT    Recommendations for Other Services       Precautions / Restrictions Precautions Precautions: None    Mobility  Bed Mobility Overal bed mobility: Modified Independent                Transfers Overall transfer level: Modified independent                  Ambulation/Gait Ambulation/Gait assistance: Supervision Ambulation Distance (Feet): 300 Feet Assistive device: Rolling walker (2 wheeled) Gait Pattern/deviations: Step-to pattern;Decreased stance time - right;Decreased dorsiflexion - right;Antalgic   Gait velocity interpretation: Below normal speed for age/gender General Gait Details: pt toe walking on RLE initially with cues for sequence, dorsiflexion and safety pt able to progress to heel down with increased distance. SEated rest halfway   Stairs Stairs: Yes   Stair Management: Backwards;With walker Number of Stairs: 2 General stair comments: pt attempted stairs sideways with rail with great difficulty for one step. Transitioned to backward with RW with much improved ability with pt educated for sequence and handout provided for assist  Wheelchair Mobility     Modified Rankin (Stroke Patients Only)       Balance                                    Cognition Arousal/Alertness: Awake/alert Behavior During Therapy: WFL for tasks assessed/performed Overall Cognitive Status: Within Functional Limits for tasks assessed                      Exercises General Exercises - Lower Extremity Long Arc Quad: AROM;Right;15 reps;Seated Toe Raises: AROM;Right;15 reps;Seated    General Comments        Pertinent Vitals/Pain Pain Score: 6  Pain Location: RLE with WB Pain Descriptors / Indicators: Sore Pain Intervention(s): Limited activity within patient's tolerance;Monitored during session;Repositioned    Home Living                      Prior Function            PT Goals (current goals can now be found in the care plan section) Progress towards PT goals: Progressing toward goals    Frequency           PT Plan Current plan remains appropriate    Co-evaluation             End of Session Equipment Utilized During Treatment: Gait belt Activity Tolerance: Patient tolerated treatment well Patient left: in bed;with call bell/phone within reach     Time: 1218-1300 PT Time Calculation (min) (ACUTE ONLY):  42 min  Charges:  $Gait Training: 23-37 mins $Therapeutic Exercise: 8-22 mins                    G Codes:      Arneshia Ade B Mallori Araque January 17, 2017, 1:07 PM Elwyn Reach, [place]

## 2017-01-13 ENCOUNTER — Ambulatory Visit: Payer: Medicare Other | Admitting: Cardiovascular Disease

## 2017-01-13 ENCOUNTER — Telehealth: Payer: Self-pay | Admitting: Vascular Surgery

## 2017-01-13 ENCOUNTER — Encounter (INDEPENDENT_AMBULATORY_CARE_PROVIDER_SITE_OTHER): Payer: Self-pay | Admitting: *Deleted

## 2017-01-13 NOTE — Telephone Encounter (Signed)
-----   Message from Mena Goes, RN sent at 01/12/2017  2:09 PM EST ----- Regarding: schedule 2-3 weeks   ----- Message ----- From: Gabriel Earing, PA-C Sent: 01/12/2017   1:57 PM To: Vvs Charge Pool  S/p thrombectomy of right femoral to popliteal bypass graft and tibial vessels with patch angioplasty.  F/u with Dr. Oneida Alar in 2-3 weeks.  Thanks, Aldona Bar

## 2017-01-13 NOTE — Telephone Encounter (Signed)
pt has mychart aware of appt date and time For 2/15 post op

## 2017-01-13 NOTE — Telephone Encounter (Signed)
Letter mailed to patient asking her to call me to schedule

## 2017-01-28 ENCOUNTER — Encounter: Payer: Self-pay | Admitting: Vascular Surgery

## 2017-02-04 ENCOUNTER — Encounter: Payer: Self-pay | Admitting: Vascular Surgery

## 2017-02-04 ENCOUNTER — Ambulatory Visit (INDEPENDENT_AMBULATORY_CARE_PROVIDER_SITE_OTHER): Payer: Self-pay | Admitting: Vascular Surgery

## 2017-02-04 ENCOUNTER — Other Ambulatory Visit: Payer: Self-pay | Admitting: Cardiovascular Disease

## 2017-02-04 VITALS — BP 147/69 | HR 80 | Temp 98.0°F | Resp 18 | Ht 65.0 in | Wt 140.4 lb

## 2017-02-04 DIAGNOSIS — I739 Peripheral vascular disease, unspecified: Secondary | ICD-10-CM

## 2017-02-04 MED ORDER — CEPHALEXIN 500 MG PO CAPS: 500.0000 mg | ORAL_CAPSULE | Freq: Three times a day (TID) | ORAL | 0 refills | Status: DC

## 2017-02-04 NOTE — Progress Notes (Addendum)
Patient is a 66 year old female who previously underwent right femoral to below-knee popliteal bypass by Dr. Kellie Simmering February 2017. I did a thrombectomy of this on 01/09/2017. Patient states that her foot has returned to baseline with no numbness tingling or aching. She does have a small amount of drainage from her below-knee incision. She is also on Eliquis and has continued this. This was 4 paroxysmal atrial fibrillation and polycythemia.  Past Medical History:  Diagnosis Date  . Arthritis    "fingers, some in my knees" (01/28/2016)  . CAD (coronary artery disease), per cath 04/27/13, non obstructive disease, EF 65-70% 05/11/2013  . Carotid disease, bilateral ([place]) 09/2013   Lt carotid 50-69% stentosis, less on rt  . Dysrhythmia   . Fatty liver   . GERD (gastroesophageal reflux disease)   . Glaucoma   . H/O cardiovascular stress test 12/27/2009   negative for ischemia  . Headache    "occasional since eye pressure regulated" (01/28/2016)  . Hyperlipidemia   . Hypertension   . Leukocytosis 07/15/2015  . Migraine    "stopped w/laser holes to relieve pressure in my eyes; had them 3-4 times/wk before taht" (01/28/2016)  . Mitral regurgitation,2+ by cath 05/11/2013  . NSTEMI (non-ST elevated myocardial infarction) ([place]) 04/27/2013  . Pain    BOTH KNEES - PT HAS TORN MENISCUS LEFT KNEE  . Paroxysmal atrial fibrillation (H[facility])    hx of a fib on ASA  AND ELIQUIS   . Polycythemia vera ([place]) 08/20/2015   JAK-2 positive 07/19/15  . PVD (peripheral vascular disease) with claudication ([place]) 07/2006   previous L ext iliac stent and rt SFA stent, known occluded Lt SFA  . S/P cardiac cath 04/27/13   NON OBSTRUCTIVE DISEASE, 2+ mr, ef 65-70%  . Statin intolerance   . Stroke ([place]) 2006   SWELLING ALL OVER AND RT SIDE OF FACE DRAWN AND SPEECH SLURRED AND NUMBNESS ON RIGHT SIDE-- ALL RESOLVED     Physical exam:  Vitals:   02/04/17 1312 02/04/17 1313  BP: (!) 148/66 (!) 147/69  Pulse: 80   Resp: 18   Temp: 98  F (36.7 C)   TempSrc: Oral   SpO2: 98%   Weight: 140 lb 6.4 oz (63.7 kg)   Height: 5\' 5"  (1.651 m)     Right lower extreme the: Vaguely palpable right posterior tibial pulse absent dorsalis pedis pulse 80% the incision is healing there is a 27 m segment with slight opening and a small amount of serous drainage. There is no surrounding erythema.  Assessment: Healing thrombectomy right femoral popliteal bypass with some incisional separation.  Plan: The patient was placed on Keflex today to try to reduce risk of infection of her prosthetic bypass graft.  she will follow-up with me in 2 weeks' time with repeat ABIs.  Ruta Hinds, MD [facility] [place] Office: [phone] Pager: [phone]

## 2017-02-04 NOTE — Telephone Encounter (Signed)
Rx(s) sent to pharmacy electronically.  

## 2017-02-10 ENCOUNTER — Encounter: Payer: Self-pay | Admitting: Family

## 2017-02-11 ENCOUNTER — Other Ambulatory Visit: Payer: Self-pay | Admitting: *Deleted

## 2017-02-11 DIAGNOSIS — I739 Peripheral vascular disease, unspecified: Secondary | ICD-10-CM

## 2017-02-16 ENCOUNTER — Ambulatory Visit (HOSPITAL_COMMUNITY)
Admission: RE | Admit: 2017-02-16 | Discharge: 2017-02-16 | Disposition: A | Discharge status: Home / Self Care | Payer: Medicare Other | Source: Ambulatory Visit | Attending: Vascular Surgery | Admitting: Vascular Surgery

## 2017-02-16 DIAGNOSIS — I10 Essential (primary) hypertension: Secondary | ICD-10-CM | POA: Insufficient documentation

## 2017-02-16 DIAGNOSIS — Z87891 Personal history of nicotine dependence: Secondary | ICD-10-CM | POA: Insufficient documentation

## 2017-02-16 DIAGNOSIS — E785 Hyperlipidemia, unspecified: Secondary | ICD-10-CM | POA: Diagnosis not present

## 2017-02-16 DIAGNOSIS — I739 Peripheral vascular disease, unspecified: Secondary | ICD-10-CM | POA: Diagnosis not present

## 2017-02-16 DIAGNOSIS — R938 Abnormal findings on diagnostic imaging of other specified body structures: Secondary | ICD-10-CM | POA: Insufficient documentation

## 2017-02-16 DIAGNOSIS — R0989 Other specified symptoms and signs involving the circulatory and respiratory systems: Secondary | ICD-10-CM | POA: Diagnosis present

## 2017-02-17 ENCOUNTER — Ambulatory Visit (INDEPENDENT_AMBULATORY_CARE_PROVIDER_SITE_OTHER): Payer: Self-pay | Admitting: Vascular Surgery

## 2017-02-17 ENCOUNTER — Encounter: Payer: Self-pay | Admitting: Vascular Surgery

## 2017-02-17 VITALS — BP 140/62 | HR 80 | Temp 97.3°F | Resp 18 | Ht 65.5 in | Wt 139.0 lb

## 2017-02-17 DIAGNOSIS — IMO0001 Reserved for inherently not codable concepts without codable children: Secondary | ICD-10-CM

## 2017-02-17 DIAGNOSIS — T814XXD Infection following a procedure, subsequent encounter: Secondary | ICD-10-CM

## 2017-02-17 MED ORDER — CEPHALEXIN 500 MG PO CAPS: 500.0000 mg | ORAL_CAPSULE | Freq: Three times a day (TID) | ORAL | 0 refills | Status: DC

## 2017-02-17 NOTE — Progress Notes (Signed)
Patient is a 66 year old female who previously underwent right femoral to below-knee popliteal bypass by Dr. Kellie Simmering February 2017. I did a thrombectomy of this on 01/09/2017. Patient states that her foot has returned to baseline with no numbness tingling or aching. She still has a small amount of drainage from her below-knee incision. This is about the same as when I saw HER-2 weeks ago. There is no surrounding erythema. There is no fever or chills. She is currently on Keflex. She is also on Eliquis and has continued this. This was for paroxysmal atrial fibrillation and polycythemia.      Physical exam:    Vitals:   02/17/17 1147  BP: 140/62  Pulse: 80  Resp: 18  Temp: 97.3 F (36.3 C)  TempSrc: Oral  SpO2: 98%  Weight: 139 lb (63 kg)  Height: 5' 5.5" (1.664 m)     Right lower extremity: Vaguely palpable right posterior tibial pulse absent dorsalis pedis pulse 80% the incision is healing there is a 2 mm segment with slight opening and a small amount of serous drainage. I probed this today with evacuation of some orange serous fluid.  A culture was performed. The wound was then packed with saline moistened Nu Gauze. There is no surrounding erythema.  Data: The patient had bilateral ABIs performed on 02/16/2017. Right side was 0.89 left side 0.72   Assessment: Healing thrombectomy right femoral popliteal bypass with some incisional separation.   Plan: Continue Keflex today to try to reduce risk of infection of her prosthetic bypass graft.  she will follow-up with me in 2 weeks' time.   Ruta Hinds, MD [facility] [place] Office: [phone] Pager: [phone]

## 2017-02-18 ENCOUNTER — Other Ambulatory Visit: Payer: Self-pay

## 2017-02-19 ENCOUNTER — Encounter: Payer: Self-pay | Admitting: Vascular Surgery

## 2017-02-19 NOTE — Telephone Encounter (Signed)
Called four home health agencies trying to get home health services for wound care but was unsuccessful as none of them were in network with her insurance. Offered for her to come to our office (Dr. Oneida Alar) for [place] to do her dressing changes but said it was too far to drive and that she did not have anyone that we could teach to do it for her. She stated she would be willing to do her daily dressing changes. She came by our office and I gave her all the supplies that she needed and went over how to do the dressing changes. Also instructed her to call our office with any questions or if the wound began to show signs of infection. She was in agreed with the plan and verbalized understanding.

## 2017-03-01 ENCOUNTER — Encounter: Payer: Medicare Other | Admitting: Oncology

## 2017-03-03 ENCOUNTER — Ambulatory Visit (INDEPENDENT_AMBULATORY_CARE_PROVIDER_SITE_OTHER): Payer: Self-pay | Admitting: Vascular Surgery

## 2017-03-03 ENCOUNTER — Encounter: Payer: Self-pay | Admitting: Vascular Surgery

## 2017-03-03 ENCOUNTER — Ambulatory Visit: Payer: Medicare Other | Admitting: Vascular Surgery

## 2017-03-03 VITALS — BP 153/78 | HR 99 | Temp 99.0°F | Resp 16 | Ht 65.5 in | Wt 138.0 lb

## 2017-03-03 DIAGNOSIS — I739 Peripheral vascular disease, unspecified: Secondary | ICD-10-CM

## 2017-03-03 MED ORDER — CEPHALEXIN 500 MG PO CAPS: 500.0000 mg | ORAL_CAPSULE | Freq: Three times a day (TID) | ORAL | 0 refills | Status: DC

## 2017-03-03 NOTE — Progress Notes (Signed)
Vitals:   03/03/17 1050  BP: (!) 156/79  Pulse: 99  Resp: 16  Temp: 99 F (37.2 C)  SpO2: 98%  Weight: 138 lb (62.6 kg)  Height: 5' 5.5" (1.664 m)

## 2017-03-03 NOTE — Addendum Note (Signed)
Addended by: Lianne Cure A on: 03/03/2017 02:00 PM   Modules accepted: Orders

## 2017-03-03 NOTE — Progress Notes (Signed)
Patient is a 66 year old female who previously underwent right femoral to below-knee popliteal bypass by Dr. Kellie Simmering February 2017. I did a thrombectomy of this on 01/09/2017. Patient states that her foot has returned to baseline with no numbness tingling or aching. She still has a small amount of drainage from her below-knee incision but this seems less. There is no surrounding erythema. There is no fever or chills. She is currently on Keflex. She is also on Eliquis and has continued this. This was for paroxysmal atrial fibrillation and polycythemia.     Physical exam:     Vitals:   03/03/17 1050 03/03/17 1055  BP: (!) 156/79 (!) 153/78  Pulse: 99 99  Resp: 16   Temp: 99 F (37.2 C)   SpO2: 98%   Weight: 138 lb (62.6 kg)   Height: 5' 5.5" (1.664 m)      Right lower extremity: Vaguely palpable right posterior tibial pulse absent dorsalis pedis pulse 80% the incision is healing there is a 3 mm segment with slight opening and a small amount of serous drainage. There may be a small amount of graft exposed at the bottom of the incision.  . The wound was then packed with saline moistened Nu Gauze.   Data: The patient had bilateral ABIs performed on 02/16/2017. Right side was 0.89 left side 0.72   Assessment: Healing thrombectomy right femoral popliteal bypass with some incisional separation.   Plan: Continue Keflex today to try to reduce risk of infection of her prosthetic bypass graft.  she will follow-up with me in 6 weeks time.   Ruta Hinds, MD [facility] [place] Office: [phone] Pager: [phone]

## 2017-03-06 ENCOUNTER — Other Ambulatory Visit: Payer: Self-pay | Admitting: Cardiovascular Disease

## 2017-03-08 NOTE — Telephone Encounter (Signed)
Rx(s) sent to pharmacy electronically.  

## 2017-03-09 ENCOUNTER — Encounter: Payer: Self-pay | Admitting: Oncology

## 2017-03-09 ENCOUNTER — Ambulatory Visit (INDEPENDENT_AMBULATORY_CARE_PROVIDER_SITE_OTHER): Payer: Medicare Other | Admitting: Oncology

## 2017-03-09 VITALS — BP 179/78 | HR 72 | Temp 97.9°F | Ht 65.5 in | Wt 142.5 lb

## 2017-03-09 DIAGNOSIS — I252 Old myocardial infarction: Secondary | ICD-10-CM

## 2017-03-09 DIAGNOSIS — R161 Splenomegaly, not elsewhere classified: Secondary | ICD-10-CM | POA: Diagnosis not present

## 2017-03-09 DIAGNOSIS — D508 Other iron deficiency anemias: Secondary | ICD-10-CM

## 2017-03-09 DIAGNOSIS — I2510 Atherosclerotic heart disease of native coronary artery without angina pectoris: Secondary | ICD-10-CM | POA: Diagnosis not present

## 2017-03-09 DIAGNOSIS — D72829 Elevated white blood cell count, unspecified: Secondary | ICD-10-CM

## 2017-03-09 DIAGNOSIS — Z91011 Allergy to milk products: Secondary | ICD-10-CM

## 2017-03-09 DIAGNOSIS — Z8673 Personal history of transient ischemic attack (TIA), and cerebral infarction without residual deficits: Secondary | ICD-10-CM | POA: Diagnosis not present

## 2017-03-09 DIAGNOSIS — Z888 Allergy status to other drugs, medicaments and biological substances status: Secondary | ICD-10-CM

## 2017-03-09 DIAGNOSIS — D45 Polycythemia vera: Secondary | ICD-10-CM

## 2017-03-09 DIAGNOSIS — D473 Essential (hemorrhagic) thrombocythemia: Secondary | ICD-10-CM | POA: Diagnosis not present

## 2017-03-09 DIAGNOSIS — Z880 Allergy status to penicillin: Secondary | ICD-10-CM

## 2017-03-09 DIAGNOSIS — Z7901 Long term (current) use of anticoagulants: Secondary | ICD-10-CM | POA: Diagnosis not present

## 2017-03-09 DIAGNOSIS — D72828 Other elevated white blood cell count: Secondary | ICD-10-CM

## 2017-03-09 DIAGNOSIS — Z885 Allergy status to narcotic agent status: Secondary | ICD-10-CM | POA: Diagnosis not present

## 2017-03-09 DIAGNOSIS — I739 Peripheral vascular disease, unspecified: Secondary | ICD-10-CM

## 2017-03-09 DIAGNOSIS — I480 Paroxysmal atrial fibrillation: Secondary | ICD-10-CM

## 2017-03-09 DIAGNOSIS — R58 Hemorrhage, not elsewhere classified: Secondary | ICD-10-CM

## 2017-03-09 NOTE — Patient Instructions (Addendum)
To lab today Repeat lab Tuesday 5/22 MD visit 4 months lab day of visit

## 2017-03-09 NOTE — Progress Notes (Signed)
Hematology and Oncology Follow Up Visit  [name] [id number] 07-31-1951 66 y.o. 03/09/2017 4:16 PM   Principle Diagnosis: Encounter Diagnoses  Name Primary?  . Polycythemia vera ([place]) Yes  . Chronic anticoagulation, new- eliquis   . Leukocytosis, unspecified type   . Other iron deficiency anemia   Clinical summary: Pleasant 66 year old woman I initially evaluated on 07/15/2015 for unexplained leukocytosis. I initially thought we were dealing with an inflammatory process related to a foreign body reaction to multiple vascular stents, however in reviewing her laboratory data, I noted that she had a chronic anemia with hemoglobins running 12 g with MCV 90 until 03/20/2014 Hemoglobin began to rise first to 14.1 with subsequent values in 2016 up to 16 g recorded on 06/27/2015. She had normal white blood counts back in December 2010. Next white count recorded was in May 2014 at 13,300. Subsequent counts were all 10,000 or above with a count on 07/15/2015 at 17,000 with 85% neutrophils. 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 Her platelet count has been normal. I did not appreciate any organomegaly on my initial exam but on my subsequent exams I was able to palpate an enlarged spleen. discussed the diagnosis of myeloproliferative disorder with her in detail at time of her 08/20/2015 follow-up visit. . Predominant complication would be predisposition towards thrombosis. She was already on aspirin and Xarelto so she is covered in that regard. With respect to thrombotic risk of increased blood viscosity, current guideline recommends keeping hematocrit less than 45%. This should be accomplished very easily in this woman who is peak hematocrit has not exceeded  52%. We discussed aphlebotomy program.I initiated a phlebotomy program removing of 500 mL's of blood monthly 3 with plans to reevaluate after that. I anticipated seeing a rapid fall in her hematocrit and then be able to space out subsequent phlebotomies to an every 3-4 month interval. She had a good understanding of all we discussed. She had her first phlebotomy Monday, September 02, 2015. She had 4 monthly phlebotomies through 12/02/2015. In February 2017, she underwent a vascular bypass procedure on her right lower extremity and lost a significant amount of blood with hemoglobin falling down as low as 6.6 on February 14. She was given temporary iron supplementation. Hemoglobin rose up to 11 g by April and has stayed at 11 g through most recent CBC done on 09/30/2016. She has not required any subsequent phlebotomies.   Interim History:  I received a communication from her vascular surgeon Dr. Shanon Rosser that she sustained another thrombotic event in her right femoral popliteal artery bypass graft requiring urgent thrombectomy on January 09, 2017 by Dr. [name].  She admits that she ran out of her Eliquis anticoagulant for about 3 weeks prior to this event.  Her myeloproliferative disorder was otherwise under control with a hemoglobin of 11.5, hematocrit 39.6.  She has a chronic leukocytosis and thrombocytosis.  She is now back on her anticoagulant and full doses in addition to previously prescribed aspirin.  She still has a healing wound medial right thigh. She lost some blood with the surgical procedures and hemoglobin fell as low as 7.8 by January 23.  Repeat lab today pending.  Renal function remains remarkably stable and normal despite her advanced peripheral vascular disease.  Medications: reviewed  Allergies:  Allergies  Allergen Reactions  . Atenolol Rash and Itching  . Clopidogrel Itching  . Demerol [Meperidine] Other (See Comments) and Itching    Unknown, can't remember   .  Inderal [Propranolol] Other (See Comments) and Itching    Doesn't remember   . Oxycodone-Acetaminophen Itching  . Prednisone Other (See Comments)    Muscle spasms  . Statins Itching and Other (See Comments)    Simvastatin-caused severe itching Pravastatin-caused lesser tiching One type of statin? Caused stroke in 2006, and other symptoms as result  . Venlafaxine Itching  . Warfarin And Related Other (See Comments)    Caused nose bleeds  . Crestor [Rosuvastatin] Itching and Rash  . Esomeprazole Rash  . Lactose Intolerance (Gi) Other (See Comments)    Bloating, gas  . Lipitor [Atorvastatin] Rash  . Lovaza [Omega-3-Acid Ethyl Esters] Other (See Comments)    nosebleeds Doesn't remember   . Penicillins Hives, Other (See Comments) and Rash    Questionable high fever when taken and broke out in whelps.   . Pravastatin Itching and Rash  . Simvastatin Itching, Rash and Other (See Comments)    Also lipitor intolerant  . Trilipix [Choline Fenofibrate] Other (See Comments)    Doesn't remember   . Warfarin Other (See Comments)    nosebleeds  . Effexor [Venlafaxine Hcl] Other (See Comments)    Doesn't remember   . Nexium [Esomeprazole Magnesium] Rash  . Percocet [Oxycodone-Acetaminophen] Other (See Comments)    Doesn't remember   . Plavix [Clopidogrel Bisulfate] Rash    Review of Systems: See interim history Remaining ROS negative:   Physical Exam: Blood pressure (!) 179/78, pulse 72, temperature 97.9 F (36.6 C), temperature source Oral, height 5' 5.5" (1.664 m), weight 142 lb 8 oz (64.6 kg), SpO2 100 %. Wt Readings from Last 3 Encounters:  03/09/17 142 lb 8 oz (64.6 kg)  03/03/17 138 lb (62.6 kg)  02/17/17 139 lb (63 kg)     General appearance: Pleasant Caucasian woman HENNT: Pharynx no erythema, exudate, mass, or ulcer. No thyromegaly or thyroid nodules Lymph nodes: No cervical, supraclavicular, or axillary lymphadenopathy Breasts:  Lungs: Clear to auscultation, resonant to  percussion throughout Heart: Regular rhythm, no murmur, no gallop, no rub, no click, no edema Abdomen: Soft, nontender, normal bowel sounds, no mass, spleen remains enlarged 5 cm below left costal margin Extremities: No edema, no calf tenderness.  Wound proximal medial right calf with overlying dressing which I did not remove status post recent thrombectomy. Musculoskeletal: no joint deformities GU:  Vascular: Carotid pulses 2+, no bruits, distal pulses: Dorsalis pedis 1+ symmetric Neurologic: Alert, oriented, PERRLA, optic discs sharp and vessels normal, no hemorrhage or exudate, cranial nerves grossly normal, motor strength 5 over 5, reflexes 1+ symmetric, upper body coordination normal, gait normal, Skin: Multiple ecchymoses on the skin of her forearms and dorsa of the hands.  Lab Results: CBC W/Diff    Component Value Date/Time   WBC 20.5 (H) 01/12/2017 0303   RBC 4.12 01/12/2017 0303   HGB 7.8 (L) 01/12/2017 0303   HCT 27.1 (L) 01/12/2017 0303   HCT 36.9 09/30/2016 1019   PLT 378 01/12/2017 0303   PLT 395 (H) 09/30/2016 1019   MCV 65.8 (L) 01/12/2017 0303   MCV 64 (L) 09/30/2016 1019   MCH 18.9 (L) 01/12/2017 0303   MCHC 28.8 (L) 01/12/2017 0303   RDW 21.8 (H) 01/12/2017 0303   RDW 21.1 (H) 09/30/2016 1019   LYMPHSABS 1.1 01/09/2017 0450   LYMPHSABS 1.3 09/30/2016 1019  MONOABS 0.8 01/09/2017 0450   EOSABS 0.3 01/09/2017 0450   EOSABS 0.5 (H) 09/30/2016 1019   BASOSABS 0.3 (H) 01/09/2017 0450   BASOSABS 0.2 09/30/2016 1019     Chemistry      Component Value Date/Time   NA 136 01/10/2017 0630   K 3.9 01/10/2017 0630   CL 105 01/10/2017 0630   CO2 23 01/10/2017 0630   BUN 10 01/10/2017 0630   CREATININE 0.81 01/10/2017 0630   CREATININE 1.33 (H) 03/03/2016 1531      Component Value Date/Time   CALCIUM 7.9 (L) 01/10/2017 0630   ALKPHOS 54 01/29/2016 0035   AST 20 01/29/2016 0035   ALT 13 (L) 01/29/2016 0035   BILITOT 0.4 01/29/2016 0035       Radiological  Studies: No results found.  Impression:  #1. Polycythemia vera diagnosed July 2016 On anticoagulation and a phlebotomy program. Phlebotomy currently on hold due to surgical blood loss.I will continue to monitor her CBCs every 2 months. If hemoglobin goes above 13, resume phlebotomy.  I am going to start her on some oral iron ferrous sulfate 325 mg daily at this time.  Repeat blood count today is pending.  #2. Leukocytosis and splenomegaly These are both related to her myeloproliferative disorder.  #3. Chronic anticoagulation with apixaban and aspirin due to paroxysmal age of fibrillation and extensive peripheral vascular disease also indicated for her myeloproliferative disorder.  #4. Coronary artery disease with history of a an STEMI May 2014  #5. Paroxysmal atrial fibrillation on treatment outlined above  #6. Cerebrovascular disease with bilateral carotid stenosis  #7. Peripheral vascular disease with claudication status post previous left external iliac stent and right SFA stent. Recent February 2017 right femoral-popliteal bypass.  In graft thrombosis February 2018 requiring emergency thrombectomy.  #8. History of TIA 2006  #9. Hyperlipidemia  #10. Glaucoma.  #11. GERD  #12. Degenerative arthritis of the knees   CC: Patient Care Team: Dione Housekeeper, MD as PCP - General (Family Medicine) Lorretta Harp, MD as Consulting Physician (Cardiology) Annia Belt, MD as Consulting Physician (Oncology) Rogene Houston, MD as Consulting Physician (Gastroenterology) Dr. Ruta Hinds,  vascular surgery    Murriel Hopper, MD, [place]  Hematology-Oncology/Internal Medicine     3/20/20184:16 PM

## 2017-03-10 LAB — CBC WITH DIFFERENTIAL/PLATELET
Basophils Absolute: 0.2 10*3/uL (ref 0.0–0.2)
Basos: 1 %
EOS (ABSOLUTE): 0.7 10*3/uL — AB (ref 0.0–0.4)
Eos: 3 %
HEMATOCRIT: 30.6 % — AB (ref 34.0–46.6)
Hemoglobin: 8.8 g/dL — ABNORMAL LOW (ref 11.1–15.9)
IMMATURE GRANS (ABS): 0.7 10*3/uL — AB (ref 0.0–0.1)
Immature Granulocytes: 3 %
LYMPHS: 5 %
Lymphocytes Absolute: 1.1 10*3/uL (ref 0.7–3.1)
MCH: 17.8 pg — ABNORMAL LOW (ref 26.6–33.0)
MCHC: 28.8 g/dL — ABNORMAL LOW (ref 31.5–35.7)
MCV: 62 fL — AB (ref 79–97)
Monocytes Absolute: 0.6 10*3/uL (ref 0.1–0.9)
Monocytes: 3 %
NEUTROS ABS: 19.3 10*3/uL — AB (ref 1.4–7.0)
NRBC: 1 % — ABNORMAL HIGH (ref 0–0)
Neutrophils: 85 %
Platelets: 429 10*3/uL — ABNORMAL HIGH (ref 150–379)
RBC: 4.93 x10E6/uL (ref 3.77–5.28)
RDW: 19.9 % — AB (ref 12.3–15.4)
WBC: 22.6 10*3/uL (ref 3.4–10.8)

## 2017-03-10 LAB — IRON AND TIBC
IRON SATURATION: 3 % — AB (ref 15–55)
IRON: 12 ug/dL — AB (ref 27–139)
Total Iron Binding Capacity: 463 ug/dL — ABNORMAL HIGH (ref 250–450)
UIBC: 451 ug/dL — AB (ref 118–369)

## 2017-03-10 LAB — FERRITIN: Ferritin: 26 ng/mL (ref 15–150)

## 2017-04-14 ENCOUNTER — Encounter: Payer: Self-pay | Admitting: Vascular Surgery

## 2017-04-20 ENCOUNTER — Ambulatory Visit (INDEPENDENT_AMBULATORY_CARE_PROVIDER_SITE_OTHER): Payer: Medicare Other | Admitting: Cardiovascular Disease

## 2017-04-20 ENCOUNTER — Encounter: Payer: Self-pay | Admitting: Cardiovascular Disease

## 2017-04-20 VITALS — BP 156/72 | HR 64 | Ht 65.5 in | Wt 141.0 lb

## 2017-04-20 DIAGNOSIS — I2510 Atherosclerotic heart disease of native coronary artery without angina pectoris: Secondary | ICD-10-CM | POA: Diagnosis not present

## 2017-04-20 DIAGNOSIS — E7800 Pure hypercholesterolemia, unspecified: Secondary | ICD-10-CM

## 2017-04-20 DIAGNOSIS — I779 Disorder of arteries and arterioles, unspecified: Secondary | ICD-10-CM | POA: Diagnosis not present

## 2017-04-20 DIAGNOSIS — I480 Paroxysmal atrial fibrillation: Secondary | ICD-10-CM

## 2017-04-20 DIAGNOSIS — I739 Peripheral vascular disease, unspecified: Secondary | ICD-10-CM | POA: Diagnosis not present

## 2017-04-20 NOTE — Assessment & Plan Note (Signed)
>>  ASSESSMENT AND PLAN FOR MIXED HYPERLIPIDEMIA WRITTEN ON 04/20/2017  9:43 AM BY BERRY, JONATHAN J, MD  History of hyperlipidemia on statin therapy followed by her PCP

## 2017-04-20 NOTE — Assessment & Plan Note (Signed)
History of hyperlipidemia on statin therapy followed by her PCP. 

## 2017-04-20 NOTE — Progress Notes (Signed)
04/20/2017 [name]   1950/12/26  [id number]  Primary Physician Sherrie Mustache, MD Primary Cardiologist: Lorretta Harp MD [name]  HPI:  The patient is a 66 year old, thin-appearing, widowed Caucasian female, mother of 2 children, grandmother to 5 grandchildren and 3 step-grandchildren who she has raised. I last saw her in the office 01/08/16. She has a history of PVOD status post left external iliac artery PTA and stenting as well as right SFA stenting by myself August 2007 with a known occluded left SFA. Her other problems include recently discontinued tobacco abuse November 11, 2012, hypertension and hyperlipidemia. She is statin intolerant. She has had PAF in the past and has been on Coumadin anticoagulation which was stopped because of nosebleeds and changed to Eliquis . She really denies chest pain, shortness of breath, or claudication. Her last Myoview performed December 27, 2009, was nonischemic. Dopplers performed a year ago showed patent stents with right ABI of 0.87 and a left of 0.65 .She had cardiac catheterization performed by Dr. Ellyn Hack revealed noncritical CAD.since I saw her back in September of last year she developed recurrent right calf claudication beginning in November or December of last year. Most recent Doppler show A decline in her ABI from 0.1-0.5 with an occluded right SFA stent suggesting in-stent restenosis. I performed angiography on her 06/27/15 revealing an occluded right SFA stent which I recanalized and restented using overlapping Viabahn covered stents. Her ABI increased from 0.5 up to 0.8 . Just after I saw her last year she did develop vertical limb ischemia on the right side and underwent urgent angiography by Dr. Fletcher Anon 01/29/16 revealing an occluded right SFA Viabahn stent. She underwent urgent right femoropopliteal bypass grafting by Dr. Kellie Simmering after this. She did have thrombosis of her graft with thrombectomy by Dr. Oneida Alar 01/09/17 and  since that time her right lower extremity has been without symptoms. She does know her lawn with some mild left calf claudication which is not lifestyle limiting.   Current Outpatient Prescriptions  Medication Sig Dispense Refill  . acetaminophen (TYLENOL) 500 MG tablet Take 1,000 mg by mouth every 6 (six) hours as needed for moderate pain.     Marland Kitchen apixaban (ELIQUIS) 5 MG TABS tablet Take 1 tablet (5 mg total) by mouth 2 (two) times daily. 60 tablet 1  . aspirin EC 325 MG tablet Take 325 mg by mouth daily.    . carboxymethylcellulose (REFRESH PLUS) 0.5 % SOLN Place 1 drop into both eyes 3 (three) times daily as needed (dry eye).     Marland Kitchen diltiazem (CARDIZEM CD) 180 MG 24 hr capsule Take 180 mg by mouth every morning.    . docusate sodium (COLACE) 100 MG capsule Take 200 mg by mouth every evening.     . ezetimibe (ZETIA) 10 MG tablet Take 1 tablet (10 mg total) by mouth at bedtime. PLEASE CONTACT OFFICE FOR ADDITIONAL REFILLS 7 tablet 0  . fenofibrate (TRICOR) 145 MG tablet Take 1 tablet (145 mg total) by mouth at bedtime. PLEASE CONTACT OFFICE FOR ADDITIONAL REFILLS 7 tablet 0  . flecainide (TAMBOCOR) 50 MG tablet Take 1 tablet (50 mg total) by mouth 2 (two) times daily. PLEASE CONTACT OFFICE FOR ADDITIONAL REFILLS 15 tablet 0  . lisinopril (PRINIVIL,ZESTRIL) 10 MG tablet TAKE 1 TABLET DAILY    . omeprazole (PRILOSEC) 20 MG capsule Take 20 mg by mouth 2 (two) times daily before a meal.     . traMADol (ULTRAM) 50 MG tablet  Take 1 tablet (50 mg total) by mouth every 6 (six) hours as needed. Currently: taking med between 2-3 times daily for pain (Patient taking differently: Take 100 mg by mouth every 6 (six) hours as needed. Currently: taking med between 2-3 times daily for pain) 20 tablet 0   No current facility-administered medications for this visit.     Allergies  Allergen Reactions  . Atenolol Rash and Itching  . Clopidogrel Itching  . Demerol [Meperidine] Other (See Comments) and Itching     Unknown, can't remember  Unknown, can't remember   . Inderal [Propranolol] Other (See Comments) and Itching    Doesn't remember   . Oxycodone-Acetaminophen Itching  . Prednisone Other (See Comments)    Muscle spasms  . Statins Itching and Other (See Comments)    Simvastatin-caused severe itching Pravastatin-caused lesser tiching One type of statin? Caused stroke in 2006, and other symptoms as result  . Venlafaxine Itching  . Warfarin And Related Other (See Comments)    Caused nose bleeds  . Crestor [Rosuvastatin] Itching and Rash  . Esomeprazole Rash  . Lactose Intolerance (Gi) Other (See Comments)    Bloating, gas  . Lipitor [Atorvastatin] Rash  . Lovaza [Omega-3-Acid Ethyl Esters] Other (See Comments)    nosebleeds Doesn't remember   . Penicillins Hives, Other (See Comments) and Rash    Questionable high fever when taken and broke out in whelps.   . Pravastatin Itching and Rash  . Simvastatin Itching, Rash and Other (See Comments)    Also lipitor intolerant  . Trilipix [Choline Fenofibrate] Other (See Comments)    Doesn't remember   . Warfarin Other (See Comments)    nosebleeds  . Effexor [Venlafaxine Hcl] Other (See Comments)    Doesn't remember   . Nexium [Esomeprazole Magnesium] Rash  . Percocet [Oxycodone-Acetaminophen] Other (See Comments)    Doesn't remember   . Plavix [Clopidogrel Bisulfate] Rash    Social History   Social History  . Marital status: Widowed    Spouse name: N/A  . Number of children: 2  . Years of education: 12   Occupational History  . CNA    Social History Main Topics  . Smoking status: Former Smoker    Packs/day: 1.00    Years: 45.00    Types: Cigarettes    Quit date: 11/11/2012  . Smokeless tobacco: Never Used     Comment: quit 4 years ago  . Alcohol use No  . Drug use: No  . Sexual activity: Not Currently    Birth control/ protection: None   Other Topics Concern  . Not on file   Social History Narrative  . No narrative  on file     Review of Systems: General: negative for chills, fever, night sweats or weight changes.  Cardiovascular: negative for chest pain, dyspnea on exertion, edema, orthopnea, palpitations, paroxysmal nocturnal dyspnea or shortness of breath Dermatological: negative for rash Respiratory: negative for cough or wheezing Urologic: negative for hematuria Abdominal: negative for nausea, vomiting, diarrhea, bright red blood per rectum, melena, or hematemesis Neurologic: negative for visual changes, syncope, or dizziness All other systems reviewed and are otherwise negative except as noted above.    Blood pressure (!) 156/72, pulse 64, height 5' 5.5" (1.664 m), weight 141 lb (64 kg).  General appearance: alert and no distress Neck: no adenopathy, no JVD, supple, symmetrical, trachea midline, thyroid not enlarged, symmetric, no tenderness/mass/nodules and Soft bilateral carotid bruits Lungs: clear to auscultation bilaterally Heart: regular rate and rhythm,  S1, S2 normal, no murmur, click, rub or gallop Extremities: extremities normal, atraumatic, no cyanosis or edema  EKG sinus rhythm at 61 without ST or T-wave changes. I personally reviewed this EKG.  ASSESSMENT AND PLAN:   PAD (peripheral artery disease), Lt carotid 50-70%: , Hx stent Lt exteral iliac & RSFA stent, known occl. LSFA  History of peripheral arterial disease status post right SFA stenting by myself August 2007 with a known occluded left SFA. She also had left external artery stenting by myself as well. Because of a decline in her ABI from 1.5 suggesting occluded SFA I re-angiogramed her 06/27/15 revealing occluded right SFA stent which I recanalized and restented using overlapping Viabahn covered stents which resulted in improvement in her right ABI from 0.5 up to 0.8. Claudication resolved at that time. She had critical limb ischemia 01/29/16 and underwent angiography by Dr. Fletcher Anon revealing an occluded right SFA via Bahn stent  felt underwent emergent right femoropopliteal bypass grafting by Dr. Kellie Simmering. She had graft thrombosis and underwent thrombectomy by Dr. Oneida Alar 01/09/17. Since that time her right lower extremity has felt well. She does have some left calf claudication which is not lifestyle limiting.  Hyperlipidemia History of hyperlipidemia on statin therapy followed by her PCP  PAF (paroxysmal atrial fibrillation), maintaing SR History of PAF maintaining sinus rhythm on Eliquis  oral anticoagulation.  Carotid artery disease History of carotid artery disease with Doppler study performed 08/06/16 revealing mild right and moderately severe left ICA stenosis scheduled to be reassessed in August of this year. She is neurologically a symptomatic.  CAD (coronary artery disease), per cath 04/27/13, non obstructive disease 20-30% LM; LAD 30%; 50-60% OM2; RCA 40%; EF 65-70% History of minimal CAD by cardiac catheterization performed by Dr. Ellyn Hack.      Lorretta Harp MD FACP,FACC,FAHA, [facility] 04/20/2017 9:44 AM

## 2017-04-20 NOTE — Assessment & Plan Note (Signed)
History of PAF maintaining sinus rhythm on Eliquis oral anticoagulation. 

## 2017-04-20 NOTE — Assessment & Plan Note (Signed)
History of carotid artery disease with Doppler study performed 08/06/16 revealing mild right and moderately severe left ICA stenosis scheduled to be reassessed in August of this year. She is neurologically a symptomatic.

## 2017-04-20 NOTE — Patient Instructions (Signed)
Medication Instructions: no change   Labwork: none   Testing/Procedures: Your physician has requested that you have a carotid duplex in August. This test is an ultrasound of the carotid arteries in your neck. It looks at blood flow through these arteries that supply the brain with blood. Allow one hour for this exam. There are no restrictions or special instructions.   Follow-Up: 1 year with Dr. [name]   If you need a refill on your cardiac medications before your next appointment, please call your pharmacy.

## 2017-04-20 NOTE — Assessment & Plan Note (Signed)
History of peripheral arterial disease status post right SFA stenting by myself August 2007 with a known occluded left SFA. She also had left external artery stenting by myself as well. Because of a decline in her ABI from 1.5 suggesting occluded SFA I re-angiogramed her 06/27/15 revealing occluded right SFA stent which I recanalized and restented using overlapping Viabahn covered stents which resulted in improvement in her right ABI from 0.5 up to 0.8. Claudication resolved at that time. She had critical limb ischemia 01/29/16 and underwent angiography by Dr. Fletcher Anon revealing an occluded right SFA via Bahn stent felt underwent emergent right femoropopliteal bypass grafting by Dr. Kellie Simmering. She had graft thrombosis and underwent thrombectomy by Dr. Oneida Alar 01/09/17. Since that time her right lower extremity has felt well. She does have some left calf claudication which is not lifestyle limiting.

## 2017-04-20 NOTE — Assessment & Plan Note (Signed)
History of minimal CAD by cardiac catheterization performed by Dr. Ellyn Hack.

## 2017-04-22 ENCOUNTER — Ambulatory Visit (HOSPITAL_COMMUNITY)
Admission: RE | Admit: 2017-04-22 | Discharge: 2017-04-22 | Disposition: A | Discharge status: Home / Self Care | Payer: Medicare Other | Source: Ambulatory Visit | Attending: Vascular Surgery | Admitting: Vascular Surgery

## 2017-04-22 ENCOUNTER — Encounter: Payer: Self-pay | Admitting: Vascular Surgery

## 2017-04-22 ENCOUNTER — Ambulatory Visit (INDEPENDENT_AMBULATORY_CARE_PROVIDER_SITE_OTHER): Payer: Medicare Other | Admitting: Vascular Surgery

## 2017-04-22 ENCOUNTER — Ambulatory Visit (INDEPENDENT_AMBULATORY_CARE_PROVIDER_SITE_OTHER)
Admission: RE | Admit: 2017-04-22 | Discharge: 2017-04-22 | Disposition: A | Discharge status: Home / Self Care | Payer: Medicare Other | Source: Ambulatory Visit | Attending: Vascular Surgery | Admitting: Vascular Surgery

## 2017-04-22 VITALS — BP 158/81 | HR 82 | Temp 98.8°F | Resp 16 | Ht 65.5 in | Wt 140.0 lb

## 2017-04-22 DIAGNOSIS — I739 Peripheral vascular disease, unspecified: Secondary | ICD-10-CM

## 2017-04-22 DIAGNOSIS — Z87891 Personal history of nicotine dependence: Secondary | ICD-10-CM | POA: Diagnosis not present

## 2017-04-22 DIAGNOSIS — E785 Hyperlipidemia, unspecified: Secondary | ICD-10-CM | POA: Diagnosis not present

## 2017-04-22 DIAGNOSIS — R938 Abnormal findings on diagnostic imaging of other specified body structures: Secondary | ICD-10-CM | POA: Insufficient documentation

## 2017-04-22 DIAGNOSIS — R0989 Other specified symptoms and signs involving the circulatory and respiratory systems: Secondary | ICD-10-CM | POA: Diagnosis present

## 2017-04-22 DIAGNOSIS — I10 Essential (primary) hypertension: Secondary | ICD-10-CM | POA: Diagnosis not present

## 2017-04-22 NOTE — Progress Notes (Signed)
Patient is a 66 year old female who previously underwent right femoral to below-knee popliteal bypass by Dr. Kellie Simmering February 2017. I did a thrombectomy of this on 01/09/2017. Patient states that her foot has returned to baseline with no numbness tingling or aching. The drainage has stopped from her right leg. The wound has completely healed. There is no surrounding erythema. There is no fever or chills. She has completed her course of Keflex. She is also on Eliquis and has continued this. This was for paroxysmal atrial fibrillation and polycythemia.  Physical exam:  Vitals:   04/22/17 1448  BP: (!) 158/81  Pulse: 82  Resp: 16  Temp: 98.8 F (37.1 C)  TempSrc: Oral  SpO2: 95%  Weight: 140 lb (63.5 kg)  Height: 5' 5.5" (1.664 m)     Right lower extremity:Vaguely palpable right posterior tibial pulses all incisions well-healed no erythema no fluctuance right foot pink warm  Data: Patient had a duplex exam of her right lower extremity bypass graft today which showed no significant narrowing and a patent bypass graft ABI was 0.9 on the right 0.68 on the left  Assessment: Patient doing well status post thrombectomy right femoropopliteal bypass. The patient had been followed by Dr. Scot Dock in the past. She apparently has follow-up scheduled with him in July of this year. We will repeat her duplex and ABIs at that time. She will continue her Eliquis.  Ruta Hinds, MD [facility] Office: [phone] Pager: [phone]

## 2017-05-17 ENCOUNTER — Emergency Department (HOSPITAL_COMMUNITY): Payer: Medicare Other | Admitting: Anesthesiology

## 2017-05-17 ENCOUNTER — Inpatient Hospital Stay (HOSPITAL_COMMUNITY)
Admission: EM | Admit: 2017-05-17 | Discharge: 2017-05-25 | DRG: 240 | Disposition: A | Discharge status: Rehabilitation Facility | Payer: Medicare Other | Attending: Vascular Surgery | Admitting: Vascular Surgery

## 2017-05-17 ENCOUNTER — Encounter (HOSPITAL_COMMUNITY): Payer: Self-pay | Admitting: Emergency Medicine

## 2017-05-17 ENCOUNTER — Encounter (HOSPITAL_COMMUNITY)
Admission: EM | Disposition: A | Discharge status: Rehabilitation Facility | Payer: Self-pay | Source: Home / Self Care | Attending: Vascular Surgery

## 2017-05-17 DIAGNOSIS — Z7901 Long term (current) use of anticoagulants: Secondary | ICD-10-CM

## 2017-05-17 DIAGNOSIS — E739 Lactose intolerance, unspecified: Secondary | ICD-10-CM | POA: Diagnosis present

## 2017-05-17 DIAGNOSIS — I739 Peripheral vascular disease, unspecified: Secondary | ICD-10-CM

## 2017-05-17 DIAGNOSIS — K30 Functional dyspepsia: Secondary | ICD-10-CM | POA: Diagnosis not present

## 2017-05-17 DIAGNOSIS — Z800 Family history of malignant neoplasm of digestive organs: Secondary | ICD-10-CM

## 2017-05-17 DIAGNOSIS — I998 Other disorder of circulatory system: Secondary | ICD-10-CM | POA: Diagnosis present

## 2017-05-17 DIAGNOSIS — R778 Other specified abnormalities of plasma proteins: Secondary | ICD-10-CM

## 2017-05-17 DIAGNOSIS — D62 Acute posthemorrhagic anemia: Secondary | ICD-10-CM

## 2017-05-17 DIAGNOSIS — R7989 Other specified abnormal findings of blood chemistry: Secondary | ICD-10-CM

## 2017-05-17 DIAGNOSIS — T82898A Other specified complication of vascular prosthetic devices, implants and grafts, initial encounter: Secondary | ICD-10-CM | POA: Diagnosis present

## 2017-05-17 DIAGNOSIS — Z801 Family history of malignant neoplasm of trachea, bronchus and lung: Secondary | ICD-10-CM | POA: Diagnosis not present

## 2017-05-17 DIAGNOSIS — Y92009 Unspecified place in unspecified non-institutional (private) residence as the place of occurrence of the external cause: Secondary | ICD-10-CM | POA: Diagnosis not present

## 2017-05-17 DIAGNOSIS — Z885 Allergy status to narcotic agent status: Secondary | ICD-10-CM

## 2017-05-17 DIAGNOSIS — Z7982 Long term (current) use of aspirin: Secondary | ICD-10-CM

## 2017-05-17 DIAGNOSIS — D45 Polycythemia vera: Secondary | ICD-10-CM | POA: Diagnosis present

## 2017-05-17 DIAGNOSIS — E46 Unspecified protein-calorie malnutrition: Secondary | ICD-10-CM | POA: Diagnosis not present

## 2017-05-17 DIAGNOSIS — I169 Hypertensive crisis, unspecified: Secondary | ICD-10-CM | POA: Diagnosis not present

## 2017-05-17 DIAGNOSIS — Y832 Surgical operation with anastomosis, bypass or graft as the cause of abnormal reaction of the patient, or of later complication, without mention of misadventure at the time of the procedure: Secondary | ICD-10-CM | POA: Diagnosis not present

## 2017-05-17 DIAGNOSIS — K219 Gastro-esophageal reflux disease without esophagitis: Secondary | ICD-10-CM | POA: Diagnosis present

## 2017-05-17 DIAGNOSIS — Z8673 Personal history of transient ischemic attack (TIA), and cerebral infarction without residual deficits: Secondary | ICD-10-CM

## 2017-05-17 DIAGNOSIS — I10 Essential (primary) hypertension: Secondary | ICD-10-CM | POA: Diagnosis present

## 2017-05-17 DIAGNOSIS — Z880 Allergy status to penicillin: Secondary | ICD-10-CM

## 2017-05-17 DIAGNOSIS — R110 Nausea: Secondary | ICD-10-CM | POA: Diagnosis not present

## 2017-05-17 DIAGNOSIS — E785 Hyperlipidemia, unspecified: Secondary | ICD-10-CM | POA: Diagnosis present

## 2017-05-17 DIAGNOSIS — I252 Old myocardial infarction: Secondary | ICD-10-CM | POA: Diagnosis not present

## 2017-05-17 DIAGNOSIS — R200 Anesthesia of skin: Secondary | ICD-10-CM

## 2017-05-17 DIAGNOSIS — Z8249 Family history of ischemic heart disease and other diseases of the circulatory system: Secondary | ICD-10-CM

## 2017-05-17 DIAGNOSIS — I2510 Atherosclerotic heart disease of native coronary artery without angina pectoris: Secondary | ICD-10-CM | POA: Diagnosis present

## 2017-05-17 DIAGNOSIS — R269 Unspecified abnormalities of gait and mobility: Secondary | ICD-10-CM | POA: Diagnosis not present

## 2017-05-17 DIAGNOSIS — Z87891 Personal history of nicotine dependence: Secondary | ICD-10-CM | POA: Diagnosis not present

## 2017-05-17 DIAGNOSIS — G546 Phantom limb syndrome with pain: Secondary | ICD-10-CM | POA: Diagnosis not present

## 2017-05-17 DIAGNOSIS — M545 Low back pain: Secondary | ICD-10-CM | POA: Diagnosis not present

## 2017-05-17 DIAGNOSIS — Z888 Allergy status to other drugs, medicaments and biological substances status: Secondary | ICD-10-CM

## 2017-05-17 DIAGNOSIS — D72829 Elevated white blood cell count, unspecified: Secondary | ICD-10-CM | POA: Diagnosis not present

## 2017-05-17 DIAGNOSIS — H409 Unspecified glaucoma: Secondary | ICD-10-CM | POA: Diagnosis present

## 2017-05-17 DIAGNOSIS — Z95828 Presence of other vascular implants and grafts: Secondary | ICD-10-CM

## 2017-05-17 DIAGNOSIS — I480 Paroxysmal atrial fibrillation: Secondary | ICD-10-CM | POA: Diagnosis present

## 2017-05-17 DIAGNOSIS — R748 Abnormal levels of other serum enzymes: Secondary | ICD-10-CM | POA: Diagnosis not present

## 2017-05-17 DIAGNOSIS — Z89611 Acquired absence of right leg above knee: Secondary | ICD-10-CM | POA: Diagnosis not present

## 2017-05-17 DIAGNOSIS — R234 Changes in skin texture: Secondary | ICD-10-CM | POA: Diagnosis not present

## 2017-05-17 DIAGNOSIS — T82868A Thrombosis of vascular prosthetic devices, implants and grafts, initial encounter: Secondary | ICD-10-CM | POA: Diagnosis not present

## 2017-05-17 DIAGNOSIS — S78111A Complete traumatic amputation at level between right hip and knee, initial encounter: Secondary | ICD-10-CM

## 2017-05-17 HISTORY — PX: VEIN HARVEST: SHX6363

## 2017-05-17 HISTORY — PX: FEMORAL-POPLITEAL BYPASS GRAFT: SHX937

## 2017-05-17 LAB — BASIC METABOLIC PANEL
ANION GAP: 7 (ref 5–15)
ANION GAP: 8 (ref 5–15)
BUN: 17 mg/dL (ref 6–20)
BUN: 16 mg/dL (ref 6–20)
CHLORIDE: 113 mmol/L — AB (ref 101–111)
CHLORIDE: 109 mmol/L (ref 101–111)
CO2: 19 mmol/L — ABNORMAL LOW (ref 22–32)
CO2: 22 mmol/L (ref 22–32)
Calcium: 8.1 mg/dL — ABNORMAL LOW (ref 8.9–10.3)
Calcium: 7.8 mg/dL — ABNORMAL LOW (ref 8.9–10.3)
Creatinine, Ser: 0.93 mg/dL (ref 0.44–1.00)
Creatinine, Ser: 0.91 mg/dL (ref 0.44–1.00)
GFR calc Af Amer: >60 mL/min (ref >60)
GFR calc non Af Amer: >60 mL/min (ref >60)
GLUCOSE: 141 mg/dL — AB (ref 65–99)
Glucose, Bld: 138 mg/dL — ABNORMAL HIGH (ref 65–99)
POTASSIUM: 4.8 mmol/L (ref 3.5–5.1)
Potassium: 4.8 mmol/L (ref 3.5–5.1)
SODIUM: 139 mmol/L (ref 135–145)
Sodium: 139 mmol/L (ref 135–145)

## 2017-05-17 LAB — CBC WITH DIFFERENTIAL/PLATELET
BASOS ABS: 0.3 10*3/uL — AB (ref 0.0–0.1)
Basophils Relative: 1 %
EOS PCT: 3 %
Eosinophils Absolute: 0.8 10*3/uL — ABNORMAL HIGH (ref 0.0–0.7)
HEMATOCRIT: 49.9 % — AB (ref 36.0–46.0)
HEMOGLOBIN: 14.4 g/dL (ref 12.0–15.0)
LYMPHS PCT: 5 %
Lymphs Abs: 1.3 10*3/uL (ref 0.7–4.0)
MCH: 21 pg — ABNORMAL LOW (ref 26.0–34.0)
MCHC: 28.9 g/dL — ABNORMAL LOW (ref 30.0–36.0)
MCV: 72.8 fL — AB (ref 78.0–100.0)
MONOS PCT: 3 %
Monocytes Absolute: 0.8 10*3/uL (ref 0.1–1.0)
NEUTROS PCT: 88 %
Neutro Abs: 22.2 10*3/uL — ABNORMAL HIGH (ref 1.7–7.7)
Platelets: 338 10*3/uL (ref 150–400)
RBC: 6.85 MIL/uL — AB (ref 3.87–5.11)
RDW: 22.7 % — ABNORMAL HIGH (ref 11.5–15.5)
WBC: 25.4 10*3/uL — AB (ref 4.0–10.5)

## 2017-05-17 LAB — COMPREHENSIVE METABOLIC PANEL
ALT: 17 U/L (ref 14–54)
ANION GAP: 11 (ref 5–15)
AST: 26 U/L (ref 15–41)
Albumin: 4 g/dL (ref 3.5–5.0)
Alkaline Phosphatase: 79 U/L (ref 38–126)
BILIRUBIN TOTAL: 0.5 mg/dL (ref 0.3–1.2)
BUN: 19 mg/dL (ref 6–20)
CHLORIDE: 107 mmol/L (ref 101–111)
CO2: 23 mmol/L (ref 22–32)
Calcium: 9.3 mg/dL (ref 8.9–10.3)
Creatinine, Ser: 0.92 mg/dL (ref 0.44–1.00)
GFR calc Af Amer: >60 mL/min (ref >60)
Glucose, Bld: 108 mg/dL — ABNORMAL HIGH (ref 65–99)
Potassium: 4.9 mmol/L (ref 3.5–5.1)
SODIUM: 141 mmol/L (ref 135–145)
Total Protein: 6.8 g/dL (ref 6.5–8.1)

## 2017-05-17 LAB — CBC
HCT: 37.4 % (ref 36.0–46.0)
HEMATOCRIT: 36.4 % (ref 36.0–46.0)
HEMOGLOBIN: 10.5 g/dL — AB (ref 12.0–15.0)
HEMOGLOBIN: 10.3 g/dL — AB (ref 12.0–15.0)
MCH: 20.7 pg — AB (ref 26.0–34.0)
MCH: 21.1 pg — ABNORMAL LOW (ref 26.0–34.0)
MCHC: 28.1 g/dL — ABNORMAL LOW (ref 30.0–36.0)
MCHC: 28.3 g/dL — ABNORMAL LOW (ref 30.0–36.0)
MCV: 73.8 fL — ABNORMAL LOW (ref 78.0–100.0)
MCV: 74.6 fL — ABNORMAL LOW (ref 78.0–100.0)
Platelets: 426 10*3/uL — ABNORMAL HIGH (ref 150–400)
Platelets: 399 10*3/uL (ref 150–400)
RBC: 5.07 MIL/uL (ref 3.87–5.11)
RBC: 4.88 MIL/uL (ref 3.87–5.11)
RDW: 23.2 % — ABNORMAL HIGH (ref 11.5–15.5)
RDW: 23.2 % — AB (ref 11.5–15.5)
WBC: 32.1 10*3/uL — ABNORMAL HIGH (ref 4.0–10.5)
WBC: 30.5 10*3/uL — AB (ref 4.0–10.5)

## 2017-05-17 LAB — POCT I-STAT 4, (NA,K, GLUC, HGB,HCT)
GLUCOSE: 116 mg/dL — AB (ref 65–99)
GLUCOSE: 128 mg/dL — AB (ref 65–99)
Glucose, Bld: 116 mg/dL — ABNORMAL HIGH (ref 65–99)
Glucose, Bld: 154 mg/dL — ABNORMAL HIGH (ref 65–99)
HCT: 42 % (ref 36.0–46.0)
HCT: 35 % — ABNORMAL LOW (ref 36.0–46.0)
HEMATOCRIT: 43 % (ref 36.0–46.0)
HEMATOCRIT: 34 % — AB (ref 36.0–46.0)
HEMOGLOBIN: 14.3 g/dL (ref 12.0–15.0)
HEMOGLOBIN: 14.6 g/dL (ref 12.0–15.0)
HEMOGLOBIN: 11.9 g/dL — AB (ref 12.0–15.0)
Hemoglobin: 11.6 g/dL — ABNORMAL LOW (ref 12.0–15.0)
POTASSIUM: 6.3 mmol/L — AB (ref 3.5–5.1)
Potassium: 6.3 mmol/L (ref 3.5–5.1)
Potassium: 5 mmol/L (ref 3.5–5.1)
Potassium: 5.6 mmol/L — ABNORMAL HIGH (ref 3.5–5.1)
SODIUM: 139 mmol/L (ref 135–145)
SODIUM: 140 mmol/L (ref 135–145)
Sodium: 138 mmol/L (ref 135–145)
Sodium: 141 mmol/L (ref 135–145)

## 2017-05-17 LAB — PROTIME-INR
INR: 1.19
Prothrombin Time: 15.2 seconds (ref 11.4–15.2)

## 2017-05-17 LAB — MRSA PCR SCREENING: MRSA BY PCR: NEGATIVE

## 2017-05-17 LAB — GLUCOSE, CAPILLARY: GLUCOSE-CAPILLARY: 123 mg/dL — AB (ref 65–99)

## 2017-05-17 LAB — PREPARE RBC (CROSSMATCH)

## 2017-05-17 SURGERY — BYPASS GRAFT FEMORAL-POPLITEAL ARTERY
Anesthesia: General | Site: Leg Upper | Laterality: Right

## 2017-05-17 MED ORDER — LACTATED RINGERS IV SOLN
INTRAVENOUS | Status: DC | PRN
  Administered 2017-05-17 (×2): via INTRAVENOUS

## 2017-05-17 MED ORDER — MORPHINE SULFATE (PF) 4 MG/ML IV SOLN
2.0000 mg | INTRAVENOUS | Status: DC | PRN
  Administered 2017-05-17 – 2017-05-20 (×8): 2 mg via INTRAVENOUS
  Filled 2017-05-17 (×9): qty 1

## 2017-05-17 MED ORDER — ALUM & MAG HYDROXIDE-SIMETH 200-200-20 MG/5ML PO SUSP: 15.0000 mL | ORAL | Status: DC | PRN

## 2017-05-17 MED ORDER — DEXTROSE 5 % IV SOLN
INTRAVENOUS | Status: DC | PRN
  Administered 2017-05-17: 1.5 g via INTRAVENOUS

## 2017-05-17 MED ORDER — FENTANYL CITRATE (PF) 100 MCG/2ML IJ SOLN
INTRAMUSCULAR | Status: DC | PRN
  Administered 2017-05-17: 100 ug via INTRAVENOUS
  Administered 2017-05-17 (×2): 50 ug via INTRAVENOUS
  Administered 2017-05-17 (×2): 25 ug via INTRAVENOUS
  Administered 2017-05-17 (×2): 50 ug via INTRAVENOUS

## 2017-05-17 MED ORDER — CALCIUM CHLORIDE 10 % IV SOLN
INTRAVENOUS | Status: DC | PRN
  Administered 2017-05-17 (×5): 100 mg via INTRAVENOUS

## 2017-05-17 MED ORDER — VANCOMYCIN HCL IN DEXTROSE 1-5 GM/200ML-% IV SOLN
1000.0000 mg | Freq: Two times a day (BID) | INTRAVENOUS | Status: AC
  Administered 2017-05-17 – 2017-05-18 (×2): 1000 mg via INTRAVENOUS
  Filled 2017-05-17 (×2): qty 200

## 2017-05-17 MED ORDER — PROPOFOL 10 MG/ML IV BOLUS
INTRAVENOUS | Status: AC
Start: 2017-05-17 — End: 2017-05-17
  Filled 2017-05-17: qty 20

## 2017-05-17 MED ORDER — POLYVINYL ALCOHOL 1.4 % OP SOLN: 1.0000 [drp] | Freq: Three times a day (TID) | OPHTHALMIC | Status: DC | PRN

## 2017-05-17 MED ORDER — FLECAINIDE ACETATE 50 MG PO TABS
50.0000 mg | ORAL_TABLET | Freq: Two times a day (BID) | ORAL | Status: DC
  Administered 2017-05-18 – 2017-05-25 (×16): 50 mg via ORAL
  Filled 2017-05-17 (×17): qty 1

## 2017-05-17 MED ORDER — SODIUM CHLORIDE 0.9 % IV SOLN
INTRAVENOUS | Status: DC | PRN
  Administered 2017-05-17 (×2): via INTRAVENOUS

## 2017-05-17 MED ORDER — LISINOPRIL 20 MG PO TABS: 20.0000 mg | ORAL_TABLET | Freq: Every day | ORAL | Status: DC

## 2017-05-17 MED ORDER — FENTANYL CITRATE (PF) 100 MCG/2ML IJ SOLN
INTRAMUSCULAR | Status: AC
  Administered 2017-05-17: 25 ug via INTRAVENOUS
  Filled 2017-05-17: qty 2

## 2017-05-17 MED ORDER — ARTIFICIAL TEARS OPHTHALMIC OINT
TOPICAL_OINTMENT | OPHTHALMIC | Status: AC
  Filled 2017-05-17: qty 3.5

## 2017-05-17 MED ORDER — EZETIMIBE 10 MG PO TABS
10.0000 mg | ORAL_TABLET | Freq: Every day | ORAL | Status: DC
  Administered 2017-05-17 – 2017-05-24 (×8): 10 mg via ORAL
  Filled 2017-05-17 (×8): qty 1

## 2017-05-17 MED ORDER — ONDANSETRON HCL 4 MG/2ML IJ SOLN
INTRAMUSCULAR | Status: DC | PRN
  Administered 2017-05-17: 4 mg via INTRAVENOUS

## 2017-05-17 MED ORDER — SODIUM CHLORIDE 0.9 % IV SOLN
INTRAVENOUS | Status: DC
  Administered 2017-05-17: 100 mL/h via INTRAVENOUS
  Administered 2017-05-18: 10:00:00 via INTRAVENOUS

## 2017-05-17 MED ORDER — GUAIFENESIN-DM 100-10 MG/5ML PO SYRP: 15.0000 mL | ORAL_SOLUTION | ORAL | Status: DC | PRN

## 2017-05-17 MED ORDER — METOPROLOL TARTRATE 5 MG/5ML IV SOLN
2.0000 mg | INTRAVENOUS | Status: DC | PRN
Start: 2017-05-17 — End: 2017-05-25

## 2017-05-17 MED ORDER — DEXAMETHASONE SODIUM PHOSPHATE 10 MG/ML IJ SOLN
INTRAMUSCULAR | Status: DC | PRN
  Administered 2017-05-17: 4 mg via INTRAVENOUS

## 2017-05-17 MED ORDER — BISACODYL 10 MG RE SUPP: 10.0000 mg | Freq: Every day | RECTAL | Status: DC | PRN

## 2017-05-17 MED ORDER — HYDROMORPHONE HCL 1 MG/ML IJ SOLN
0.5000 mg | Freq: Once | INTRAMUSCULAR | Status: AC
  Administered 2017-05-17: 0.5 mg via INTRAVENOUS
  Filled 2017-05-17: qty 1

## 2017-05-17 MED ORDER — IOPAMIDOL (ISOVUE-300) INJECTION 61%
INTRAVENOUS | Status: AC
  Filled 2017-05-17: qty 50

## 2017-05-17 MED ORDER — DOCUSATE SODIUM 100 MG PO CAPS
200.0000 mg | ORAL_CAPSULE | Freq: Every evening | ORAL | Status: DC
  Administered 2017-05-18 – 2017-05-24 (×6): 200 mg via ORAL
  Filled 2017-05-17 (×6): qty 2

## 2017-05-17 MED ORDER — LIDOCAINE 2% (20 MG/ML) 5 ML SYRINGE
INTRAMUSCULAR | Status: AC
  Filled 2017-05-17: qty 5

## 2017-05-17 MED ORDER — FENTANYL CITRATE (PF) 250 MCG/5ML IJ SOLN
INTRAMUSCULAR | Status: AC
  Filled 2017-05-17: qty 5

## 2017-05-17 MED ORDER — GLYCOPYRROLATE 0.2 MG/ML IJ SOLN
INTRAMUSCULAR | Status: DC | PRN
  Administered 2017-05-17: 0.2 mg via INTRAVENOUS

## 2017-05-17 MED ORDER — TRAMADOL HCL 50 MG PO TABS
100.0000 mg | ORAL_TABLET | Freq: Four times a day (QID) | ORAL | Status: DC | PRN
  Administered 2017-05-18 – 2017-05-25 (×21): 100 mg via ORAL
  Filled 2017-05-17 (×21): qty 2

## 2017-05-17 MED ORDER — ROCURONIUM BROMIDE 100 MG/10ML IV SOLN
INTRAVENOUS | Status: DC | PRN
  Administered 2017-05-17: 20 mg via INTRAVENOUS
  Administered 2017-05-17: 30 mg via INTRAVENOUS

## 2017-05-17 MED ORDER — HEPARIN SODIUM (PORCINE) 1000 UNIT/ML IJ SOLN
INTRAMUSCULAR | Status: AC
  Filled 2017-05-17: qty 1

## 2017-05-17 MED ORDER — LISINOPRIL 10 MG PO TABS
20.0000 mg | ORAL_TABLET | Freq: Every day | ORAL | Status: DC
  Administered 2017-05-18 – 2017-05-25 (×8): 20 mg via ORAL
  Filled 2017-05-17 (×6): qty 2
  Filled 2017-05-17: qty 1
  Filled 2017-05-17 (×2): qty 2

## 2017-05-17 MED ORDER — ONDANSETRON HCL 4 MG/2ML IJ SOLN
4.0000 mg | Freq: Four times a day (QID) | INTRAMUSCULAR | Status: DC | PRN
  Administered 2017-05-17 – 2017-05-21 (×2): 4 mg via INTRAVENOUS
  Filled 2017-05-17: qty 2

## 2017-05-17 MED ORDER — FUROSEMIDE 10 MG/ML IJ SOLN
INTRAMUSCULAR | Status: DC | PRN
  Administered 2017-05-17: 10 mg via INTRAMUSCULAR

## 2017-05-17 MED ORDER — SODIUM CHLORIDE 0.9 % IV SOLN
INTRAVENOUS | Status: DC | PRN
  Administered 2017-05-17: 11:00:00 500 mL

## 2017-05-17 MED ORDER — HYDRALAZINE HCL 20 MG/ML IJ SOLN
5.0000 mg | INTRAMUSCULAR | Status: AC | PRN
  Administered 2017-05-23 – 2017-05-25 (×2): 5 mg via INTRAVENOUS
  Filled 2017-05-17 (×2): qty 1

## 2017-05-17 MED ORDER — DEXAMETHASONE SODIUM PHOSPHATE 10 MG/ML IJ SOLN
INTRAMUSCULAR | Status: AC
  Filled 2017-05-17: qty 1

## 2017-05-17 MED ORDER — ACETAMINOPHEN 650 MG RE SUPP: 325.0000 mg | Freq: Four times a day (QID) | RECTAL | Status: DC | PRN

## 2017-05-17 MED ORDER — NEOSTIGMINE METHYLSULFATE 10 MG/10ML IV SOLN
INTRAVENOUS | Status: DC | PRN
  Administered 2017-05-17: 2 mg via INTRAVENOUS

## 2017-05-17 MED ORDER — MIDAZOLAM HCL 2 MG/2ML IJ SOLN
INTRAMUSCULAR | Status: AC
  Filled 2017-05-17: qty 2

## 2017-05-17 MED ORDER — HEMOSTATIC AGENTS (NO CHARGE) OPTIME
TOPICAL | Status: DC | PRN
  Administered 2017-05-17: 1 via TOPICAL

## 2017-05-17 MED ORDER — 0.9 % SODIUM CHLORIDE (POUR BTL) OPTIME
TOPICAL | Status: DC | PRN
  Administered 2017-05-17: 1000 mL

## 2017-05-17 MED ORDER — ONDANSETRON HCL 4 MG/2ML IJ SOLN
INTRAMUSCULAR | Status: AC
  Filled 2017-05-17: qty 2

## 2017-05-17 MED ORDER — HEPARIN SODIUM (PORCINE) 1000 UNIT/ML IJ SOLN
INTRAMUSCULAR | Status: DC | PRN
  Administered 2017-05-17: 6000 [IU] via INTRAVENOUS
  Administered 2017-05-17: 3000 [IU] via INTRAVENOUS

## 2017-05-17 MED ORDER — INSULIN ASPART 100 UNIT/ML IV SOLN
10.0000 [IU] | Freq: Once | INTRAVENOUS | Status: AC
  Administered 2017-05-17: 10 [IU] via INTRAVENOUS
  Filled 2017-05-17: qty 0.1

## 2017-05-17 MED ORDER — POLYETHYLENE GLYCOL 3350 17 G PO PACK: 17.0000 g | PACK | Freq: Every day | ORAL | Status: DC | PRN

## 2017-05-17 MED ORDER — SODIUM CHLORIDE 0.9 % IV SOLN
500.0000 mL | Freq: Once | INTRAVENOUS | Status: AC | PRN
  Administered 2017-05-17: 500 mL via INTRAVENOUS

## 2017-05-17 MED ORDER — POTASSIUM CHLORIDE CRYS ER 20 MEQ PO TBCR: 20.0000 meq | EXTENDED_RELEASE_TABLET | Freq: Every day | ORAL | Status: DC | PRN

## 2017-05-17 MED ORDER — PANTOPRAZOLE SODIUM 40 MG PO TBEC
40.0000 mg | DELAYED_RELEASE_TABLET | Freq: Every day | ORAL | Status: DC
  Administered 2017-05-18 – 2017-05-25 (×8): 40 mg via ORAL
  Filled 2017-05-17 (×8): qty 1

## 2017-05-17 MED ORDER — PHENYLEPHRINE HCL 10 MG/ML IJ SOLN
INTRAVENOUS | Status: DC | PRN
  Administered 2017-05-17: 10 ug/min via INTRAVENOUS

## 2017-05-17 MED ORDER — PROTAMINE SULFATE 10 MG/ML IV SOLN
INTRAVENOUS | Status: AC
  Filled 2017-05-17: qty 5

## 2017-05-17 MED ORDER — MIDAZOLAM HCL 5 MG/5ML IJ SOLN
INTRAMUSCULAR | Status: DC | PRN
  Administered 2017-05-17 (×2): 1 mg via INTRAVENOUS

## 2017-05-17 MED ORDER — SUCCINYLCHOLINE CHLORIDE 200 MG/10ML IV SOSY
PREFILLED_SYRINGE | INTRAVENOUS | Status: AC
  Filled 2017-05-17: qty 10

## 2017-05-17 MED ORDER — MAGNESIUM SULFATE 2 GM/50ML IV SOLN
2.0000 g | Freq: Every day | INTRAVENOUS | Status: DC | PRN
  Filled 2017-05-17: qty 50

## 2017-05-17 MED ORDER — PROPOFOL 10 MG/ML IV BOLUS
INTRAVENOUS | Status: DC | PRN
  Administered 2017-05-17: 100 mg via INTRAVENOUS

## 2017-05-17 MED ORDER — MORPHINE SULFATE (PF) 2 MG/ML IV SOLN: 2.0000 mg | INTRAVENOUS | Status: DC | PRN

## 2017-05-17 MED ORDER — ROCURONIUM BROMIDE 10 MG/ML (PF) SYRINGE
PREFILLED_SYRINGE | INTRAVENOUS | Status: AC
  Filled 2017-05-17: qty 5

## 2017-05-17 MED ORDER — ACETAMINOPHEN 325 MG PO TABS
325.0000 mg | ORAL_TABLET | Freq: Four times a day (QID) | ORAL | Status: DC | PRN
  Administered 2017-05-18 – 2017-05-25 (×10): 650 mg via ORAL
  Filled 2017-05-17 (×10): qty 2

## 2017-05-17 MED ORDER — DILTIAZEM HCL ER COATED BEADS 180 MG PO CP24
180.0000 mg | ORAL_CAPSULE | Freq: Every morning | ORAL | Status: DC
  Administered 2017-05-18 – 2017-05-25 (×8): 180 mg via ORAL
  Filled 2017-05-17 (×8): qty 1

## 2017-05-17 MED ORDER — ONDANSETRON HCL 4 MG/2ML IJ SOLN: 4.0000 mg | Freq: Once | INTRAMUSCULAR | Status: DC | PRN

## 2017-05-17 MED ORDER — ARTIFICIAL TEARS OPHTHALMIC OINT
TOPICAL_OINTMENT | OPHTHALMIC | Status: DC | PRN
  Administered 2017-05-17: 1 via OPHTHALMIC

## 2017-05-17 MED ORDER — INSULIN ASPART 100 UNIT/ML ~~LOC~~ SOLN
SUBCUTANEOUS | Status: AC
  Filled 2017-05-17: qty 1

## 2017-05-17 MED ORDER — SUGAMMADEX SODIUM 200 MG/2ML IV SOLN
INTRAVENOUS | Status: AC
  Filled 2017-05-17: qty 2

## 2017-05-17 MED ORDER — DEXTROSE 50 % IV SOLN
INTRAVENOUS | Status: DC | PRN
  Administered 2017-05-17: 12.5 g via INTRAVENOUS

## 2017-05-17 MED ORDER — LIDOCAINE HCL (CARDIAC) 20 MG/ML IV SOLN
INTRAVENOUS | Status: DC | PRN
  Administered 2017-05-17: 80 mg via INTRAVENOUS

## 2017-05-17 MED ORDER — FUROSEMIDE 10 MG/ML IJ SOLN
INTRAMUSCULAR | Status: AC
  Filled 2017-05-17: qty 4

## 2017-05-17 MED ORDER — PHENOL 1.4 % MT LIQD: 1.0000 | OROMUCOSAL | Status: DC | PRN

## 2017-05-17 MED ORDER — FENTANYL CITRATE (PF) 100 MCG/2ML IJ SOLN
25.0000 ug | INTRAMUSCULAR | Status: DC | PRN
  Administered 2017-05-17: 25 ug via INTRAVENOUS

## 2017-05-17 MED ORDER — PROTAMINE SULFATE 10 MG/ML IV SOLN
INTRAVENOUS | Status: DC | PRN
  Administered 2017-05-17 (×10): 10 mg via INTRAVENOUS

## 2017-05-17 MED ORDER — LABETALOL HCL 5 MG/ML IV SOLN
10.0000 mg | INTRAVENOUS | Status: AC | PRN
  Administered 2017-05-20 – 2017-05-23 (×4): 10 mg via INTRAVENOUS
  Filled 2017-05-17 (×4): qty 4

## 2017-05-17 SURGICAL SUPPLY — 60 items
ADH SKN CLS APL DERMABOND .7 (GAUZE/BANDAGES/DRESSINGS) ×6
AGENT HMST SPONGE THK3/8 (HEMOSTASIS) ×2
BANDAGE ESMARK 6X9 LF (GAUZE/BANDAGES/DRESSINGS) IMPLANT
BNDG CMPR 9X6 STRL LF SNTH (GAUZE/BANDAGES/DRESSINGS)
BNDG ESMARK 6X9 LF (GAUZE/BANDAGES/DRESSINGS)
CANISTER SUCT 3000ML PPV (MISCELLANEOUS) ×4 IMPLANT
CANNULA VESSEL 3MM 2 BLNT TIP (CANNULA) ×8 IMPLANT
CATH EMB 3FR 80CM (CATHETERS) ×2 IMPLANT
CATH EMB 4FR 80CM (CATHETERS) ×2 IMPLANT
CLIP LIGATING EXTRA MED SLVR (CLIP) ×4 IMPLANT
CLIP LIGATING EXTRA SM BLUE (MISCELLANEOUS) ×4 IMPLANT
CUFF TOURNIQUET SINGLE 34IN LL (TOURNIQUET CUFF) IMPLANT
CUFF TOURNIQUET SINGLE 44IN (TOURNIQUET CUFF) IMPLANT
DERMABOND ADVANCED (GAUZE/BANDAGES/DRESSINGS) ×6
DERMABOND ADVANCED .7 DNX12 (GAUZE/BANDAGES/DRESSINGS) ×2 IMPLANT
DRAIN CHANNEL 15F RND FF W/TCR (WOUND CARE) ×4 IMPLANT
DRAIN SNY 10X20 3/4 PERF (WOUND CARE) IMPLANT
DRAPE HALF SHEET 40X57 (DRAPES) IMPLANT
DRAPE X-RAY CASS 24X20 (DRAPES) IMPLANT
DRSG COVADERM 4X8 (GAUZE/BANDAGES/DRESSINGS) ×2 IMPLANT
ELECT REM PT RETURN 9FT ADLT (ELECTROSURGICAL) ×4
ELECTRODE REM PT RTRN 9FT ADLT (ELECTROSURGICAL) ×2 IMPLANT
EVACUATOR SILICONE 100CC (DRAIN) ×4 IMPLANT
GAUZE SPONGE 4X4 12PLY STRL (GAUZE/BANDAGES/DRESSINGS) ×4 IMPLANT
GLOVE BIO SURGEON STRL SZ 6.5 (GLOVE) ×2 IMPLANT
GLOVE BIO SURGEONS STRL SZ 6.5 (GLOVE) ×2
GLOVE BIOGEL PI IND STRL 7.0 (GLOVE) IMPLANT
GLOVE BIOGEL PI INDICATOR 7.0 (GLOVE) ×2
GLOVE INDICATOR 7.5 STRL GRN (GLOVE) ×4 IMPLANT
GLOVE SS BIOGEL STRL SZ 7.5 (GLOVE) ×2 IMPLANT
GLOVE SUPERSENSE BIOGEL SZ 7.5 (GLOVE) ×2
GLOVE SURG SS PI 7.5 STRL IVOR (GLOVE) ×4 IMPLANT
GOWN STRL REUS W/ TWL LRG LVL3 (GOWN DISPOSABLE) ×6 IMPLANT
GOWN STRL REUS W/TWL LRG LVL3 (GOWN DISPOSABLE) ×12
GRAFT PROPATEN W/RING 6X80X60 (Vascular Products) ×2 IMPLANT
HEMOSTAT SPONGE AVITENE ULTRA (HEMOSTASIS) ×2 IMPLANT
INSERT FOGARTY SM (MISCELLANEOUS) IMPLANT
KIT BASIN OR (CUSTOM PROCEDURE TRAY) ×4 IMPLANT
KIT ROOM TURNOVER OR (KITS) ×4 IMPLANT
NS IRRIG 1000ML POUR BTL (IV SOLUTION) ×8 IMPLANT
PACK PERIPHERAL VASCULAR (CUSTOM PROCEDURE TRAY) ×4 IMPLANT
PAD ARMBOARD 7.5X6 YLW CONV (MISCELLANEOUS) ×8 IMPLANT
PADDING CAST COTTON 6X4 STRL (CAST SUPPLIES) IMPLANT
SET COLLECT BLD 21X3/4 12 (NEEDLE) IMPLANT
STAPLER VISISTAT 35W (STAPLE) IMPLANT
STOPCOCK 4 WAY LG BORE MALE ST (IV SETS) IMPLANT
SUT ETHILON 3 0 PS 1 (SUTURE) ×4 IMPLANT
SUT PROLENE 5 0 C 1 24 (SUTURE) ×10 IMPLANT
SUT PROLENE 6 0 CC (SUTURE) ×22 IMPLANT
SUT SILK 2 0 SH (SUTURE) ×4 IMPLANT
SUT VIC AB 2-0 CT1 27 (SUTURE) ×8
SUT VIC AB 2-0 CT1 TAPERPNT 27 (SUTURE) IMPLANT
SUT VIC AB 2-0 CTX 36 (SUTURE) ×4 IMPLANT
SUT VIC AB 3-0 SH 27 (SUTURE) ×12
SUT VIC AB 3-0 SH 27X BRD (SUTURE) ×4 IMPLANT
SUT VICRYL 4-0 PS2 18IN ABS (SUTURE) ×4 IMPLANT
TRAY FOLEY W/METER SILVER 16FR (SET/KITS/TRAYS/PACK) ×4 IMPLANT
TUBING EXTENTION W/L.L. (IV SETS) IMPLANT
UNDERPAD 30X30 (UNDERPADS AND DIAPERS) ×4 IMPLANT
WATER STERILE IRR 1000ML POUR (IV SOLUTION) ×4 IMPLANT

## 2017-05-17 NOTE — Op Note (Signed)
OPERATIVE REPORT  DATE OF SURGERY: 05/17/2017  PATIENT: [name], 66 y.o. female MRN: [id number]  DOB: 17-Mar-1951  PRE-OPERATIVE DIAGNOSIS: Acute critical limb ischemia  POST-OPERATIVE DIAGNOSIS:  Same  PROCEDURE: Right femoral to tibioperoneal trunk bypass with composite Gore-Tex graft and great saphenous vein from the left thigh  SURGEON:  Curt Jews, M.D.  PHYSICIAN ASSISTANT: Samantha Rhyne PA-C  ANESTHESIA:  Gen.  EBL: 950 ml  Total I/O In: 3700 [I.V.:3700] Out: 1310 [Urine:360; Blood:950]  BLOOD ADMINISTERED: None  DRAINS: Jackson-Pratt drain in the groin and popliteal space  SPECIMEN: None  COUNTS CORRECT:  YES  PLAN OF CARE: PACU   PATIENT DISPOSITION:  PACU - hemodynamically stable  PROCEDURE DETAILS: Patient is a extremely complex past history thousand In my admission note. She had 3 prior endovascular SFA treatments self-cleaning underwent initially a Gore-Tex femoropopliteal in February 2017 with acute failure had thrombectomy most recently on 01/09/2017. She presents to the emergency room this morning with the sudden onset of the severe pain and motor and sensory loss partially 6:37 AM this morning. She was taken immediately to the operating room. She had had some difficulty in healing her below-knee popliteal incision and had the prolonged drainage. The right and left leg were prepped and draped in usual sterile fashion. The below-knee popliteal incision was opened on the right and there was old hematoma present. This was evacuated and the Gore-Tex graft was seen in the base of the wound. This was isolated and the anterior tibial and tibioperoneal trunks were encircled with Vesseloops. The graft was opened and 4 Fogarty was passed centrally and would not pass to the level of the femoral anastomosis. The patient had a prior patch over the popliteal artery extending onto the tibioperoneal trunk. This was removed. A 3 Fogarty was attempted to pass down to  the anterior tibial artery. The artery was occluded just past its origin. The peroneal artery was occluded. The catheter was passed centrally down the posterior tibial artery and was able to be passed all the way into the foot. On removal there was thrombus from the posterior tibial artery. Several never negative passes were following this. The posterior tibial artery was flushed with heparinized and reoccluded. The right groin incision was reopened and the patient had a prior patch over the femoral arteries common femoral artery extending onto the superficial femoral artery. The Gore-Tex graft was opened over the hood of the graft and there was a complete occlusion of the common femoral and profundus reverse arteries. The common femoral was rhombic demise. The deep femoral artery was thrombectomized with excellent backbleeding. The old Gore-Tex graft was removed in its entirety. A new ringed Gore-Tex graft was brought onto the field this was a 6 mm heparin-bonded graft. Spatulated and sewn into side to the femoral artery with the graft extending from the deep femoral artery onto the common femoral artery. This was with a 5-0 Prolene suture. Anastomosis was tested and there was a great deal needle hole bleeding. Next the tunnel was created from the level of the popliteal space to the groin. The graft brought to the popliteal space.  His left 7 acid and reoccluded. The graft had to be sewn to the tibioperoneal trunk was the only remaining outflow. Felt that a composite with saphenous vein would be preferable. Prior to initiation the surgery the left saphenous vein was imaged determine if it was possible to do a saphenous vein graft. Saphenous vein was of good caliber in the  proximal thigh and then splitting the various different branches was unusual for the entire rest the leg. Small saphenous vein on the right was also inadequate. An incision was made over the thigh on the left and the usable portion of the vein  was harvested through the thigh. Curvature branches were ligated with 3 or 4 silk ties and divided. The vein was gently dilated was found to be adequate for a composite graft. The saphenous vein itself is proximally 4 cm in length. The saphenous vein was reversed and sewn into into the Gore-Tex graft with a running 6-0 Prolene suture. This anastomosis was tested and found to be adequate. The vein graft was then cut to appropriate length and was spatulated and sewn into into the tibioperoneal trunk. The clamps removed and graft dependent Doppler flow is noted posterior tibial at the ankle. The patient was given intravenous heparin prior to the occlusion of the common femoral artery on the right. The heparin was reversed with 100 mg of protamine. The patient was having ongoing using. Was onEliquis. Great deal of time was used to try to obtain the best hemostasis possible with electrocautery. The ongoing using continued and therefore 15 Blake drains were placed the base of the both wounds brought out through separate stabs and was secured to the skin with 3-0 nylon stitches. The wounds were closed with 2-0 Vicryl in the fascia and subcutaneous tissue and the skin was closed with 3 or septic or Vicryl sutures. Sterile dressing was applied the patient was taken to the recovery was stable condition   Rosetta Posner, M.D., [facility] 05/17/2017 3:31 PM

## 2017-05-17 NOTE — Anesthesia Preprocedure Evaluation (Signed)
Anesthesia Evaluation  Patient identified by MRN, date of birth, ID band Patient awake    Reviewed: Allergy & Precautions, NPO status , Patient's Chart, lab work & pertinent test results, reviewed documented beta blocker date and time   Airway Mallampati: I  TM Distance: >3 FB Neck ROM: Full    Dental  (+) Edentulous Upper, Edentulous Lower, Dental Advisory Given   Pulmonary former smoker,    Pulmonary exam normal breath sounds clear to auscultation       Cardiovascular hypertension, Pt. on home beta blockers + CAD, + Past MI and + Peripheral Vascular Disease  + dysrhythmias Atrial Fibrillation + Valvular Problems/Murmurs MR  Rhythm:Regular Rate:Normal + Systolic murmurs    Neuro/Psych  Headaches, CVA, No Residual Symptoms negative psych ROS   GI/Hepatic Neg liver ROS, GERD  Medicated and Controlled,  Endo/Other  negative endocrine ROS  Renal/GU negative Renal ROS  negative genitourinary   Musculoskeletal  (+) Arthritis , Osteoarthritis,    Abdominal   Peds  Hematology  (+) Blood dyscrasia (eliquis), , Polycythemia   Anesthesia Other Findings Day of surgery medications reviewed with the patient.  Reproductive/Obstetrics                             Lab Results  Component Value Date   WBC 25.4 (H) 05/17/2017   HGB 14.4 05/17/2017   HCT 49.9 (H) 05/17/2017   MCV 72.8 (L) 05/17/2017   PLT 338 05/17/2017   Lab Results  Component Value Date   CREATININE 0.92 05/17/2017   BUN 19 05/17/2017   NA 141 05/17/2017   K 4.9 05/17/2017   CL 107 05/17/2017   CO2 23 05/17/2017   Lab Results  Component Value Date   INR 1.19 05/17/2017   INR 1.07 01/09/2017   INR 1.09 01/31/2016   EKG: normal sinus rhythm.  Anesthesia Physical  Anesthesia Plan  ASA: III and emergent  Anesthesia Plan: General   Post-op Pain Management:    Induction: Intravenous  Airway Management Planned: Oral  ETT  Additional Equipment:   Intra-op Plan:   Post-operative Plan: Extubation in OR  Informed Consent: I have reviewed the patients History and Physical, chart, labs and discussed the procedure including the risks, benefits and alternatives for the proposed anesthesia with the patient or authorized representative who has indicated his/her understanding and acceptance.     Plan Discussed with: CRNA and Surgeon  Anesthesia Plan Comments:         Anesthesia Quick Evaluation

## 2017-05-17 NOTE — ED Triage Notes (Signed)
Pt to ER for evaluation of acute right foot pain on awakening this morning. Pale foot noted, EMS reports faint pulse. Significant hx of peripheral vascular disease with stent placement.

## 2017-05-17 NOTE — Transfer of Care (Signed)
Immediate Anesthesia Transfer of Care Note  Patient: [name]  Procedure(s) Performed: Procedure(s): RIGHT FEMORAL-TIBIAL PERONEAL TRUNK ARTERY BYPASS GRAFT USING 66mmX80cm PROPATEN GRAFT WITH REMOVABLE RING (Right) LEFT LEG GREATER SAPHENOUS VEIN HARVEST (Left)  Patient Location: PACU  Anesthesia Type:General  Level of Consciousness: awake, alert  and oriented  Airway & Oxygen Therapy: Patient Spontanous Breathing and Patient connected to nasal cannula oxygen  Post-op Assessment: Report given to RN, Post -op Vital signs reviewed and stable and Patient moving all extremities  Post vital signs: Reviewed and stable  Last Vitals:  Vitals:   05/17/17 0945 05/17/17 1000  BP: (!) 188/170 (!) 208/88  Pulse: 72 70  Resp: (!) 22 17  Temp:      Last Pain:  Vitals:   05/17/17 0923  TempSrc:   PainSc: 10-Worst pain ever         Complications: No apparent anesthesia complications.  Blood pressure soft.  PACU RN aware patient received insulin and D50 for high potassium in OR.  Will continue to closely monitor blood glucose and labs.

## 2017-05-17 NOTE — ED Provider Notes (Signed)
[facility] DEPT Provider Note   CSN: [id number] Arrival date & time: 05/17/17  0932     History   Chief Complaint Chief Complaint  Patient presents with  . Foot Pain    HPI TEYONA NICHELSON is a 66 y.o. female.  HPI   Patient is 65 year old female presenting today with pulseless and painful right lower extremity. Pt had right femoral to below-knee popliteal bypass by Dr. Kellie Simmering February 2017 with a thrombectomy of this on 01/09/2017 by Oneida Alar.  Had receent ABI of .9 on that side done on 5/3.   Has been having mild pain in the right foot. Then this morning woke up and noticed that it was colder, whiter, and painful.  Patient on Eloquis and aspirin.  Past Medical History:  Diagnosis Date  . Arthritis    "fingers, some in my knees" (01/28/2016)  . CAD (coronary artery disease), per cath 04/27/13, non obstructive disease, EF 65-70% 05/11/2013  . Carotid disease, bilateral ([place]) 09/2013   Lt carotid 50-69% stentosis, less on rt  . Dysrhythmia   . Fatty liver   . GERD (gastroesophageal reflux disease)   . Glaucoma   . H/O cardiovascular stress test 12/27/2009   negative for ischemia  . Headache    "occasional since eye pressure regulated" (01/28/2016)  . Hyperlipidemia   . Hypertension   . Leukocytosis 07/15/2015  . Migraine    "stopped w/laser holes to relieve pressure in my eyes; had them 3-4 times/wk before taht" (01/28/2016)  . Mitral regurgitation,2+ by cath 05/11/2013  . NSTEMI (non-ST elevated myocardial infarction) ([place]) 04/27/2013  . Pain    BOTH KNEES - PT HAS TORN MENISCUS LEFT KNEE  . Paroxysmal atrial fibrillation (HCC)    hx of a fib on ASA  AND ELIQUIS   . Polycythemia vera ([place]) 08/20/2015   JAK-2 positive 07/19/15  . PVD (peripheral vascular disease) with claudication ([place]) 07/2006   previous L ext iliac stent and rt SFA stent, known occluded Lt SFA  . S/P cardiac cath 04/27/13   NON OBSTRUCTIVE DISEASE, 2+ mr, ef 65-70%  . Statin intolerance   . Stroke () 2006   SWELLING ALL OVER AND RT SIDE OF FACE DRAWN AND SPEECH SLURRED AND NUMBNESS ON RIGHT SIDE-- ALL RESOLVED    Patient Active Problem List   Diagnosis Date Noted  . Thrombosis of right femoral artery ([place]) 01/09/2017  . Ischemia of extremity 01/09/2017  . History of colonic polyps 07/30/2016  . Essential hypertension 01/08/2016  . Polycythemia vera ([place]) 08/20/2015  . Leukocytosis 07/15/2015  . S/P arterial stent right SFA overlapping Bahn covered stents 06/27/2015 06/28/2015  . Claudication ([place]) 06/27/2015  . Carotid artery disease ([place]) 08/24/2014  . Osteoarthritis of left knee 03/27/2014  . External hemorrhoids 06/06/2013  . PAF (paroxysmal atrial fibrillation), maintaing SR 05/11/2013  . Chronic anticoagulation, new- eliquis 05/11/2013  . CAD (coronary artery disease), per cath 04/27/13, non obstructive disease 20-30% LM; LAD 30%; 50-60% OM2; RCA 40%; EF 65-70% 05/11/2013  . Mitral regurgitation,2+ by cath 05/11/2013  . Atrial fibrillation with RVR, hx of PAF previously 04/27/2013  . PAD (peripheral artery disease), Lt carotid 50-70%: , Hx stent Lt exteral iliac & RSFA stent, known occl. LSFA  04/27/2013  . Hyperlipidemia 04/27/2013  . History of CVA (cerebrovascular accident) 04/27/2013  . NSTEMI (non-ST elevated myocardial infarction) ([place]) 04/27/2013    Past Surgical History:  Procedure Laterality Date  . CARDIAC CATHETERIZATION  04/27/13   non occlusive disease with mild to mod.  calcified lesions in ostial LM and proximal LAD and moderate prox. RC AND DISTAL AV GROOVE LCX, ef  . CATARACT EXTRACTION W/PHACO Right 03/21/2015   Procedure: CATARACT EXTRACTION PHACO AND INTRAOCULAR LENS PLACEMENT RIGHT EYE;  Surgeon: Baruch Goldmann, MD;  Location: [facility];  Service: Ophthalmology;  Laterality: Right;  CDE:5.10  . CATARACT EXTRACTION W/PHACO Left 05/16/2015   Procedure: CATARACT EXTRACTION PHACO AND INTRAOCULAR LENS PLACEMENT (IOC);  Surgeon: Baruch Goldmann, MD;  Location: [facility];  Service:  Ophthalmology;  Laterality: Left;  CDE 5.57  . [place]  . COLONOSCOPY N/A 09/11/2016   Procedure: COLONOSCOPY;  Surgeon: Rogene Houston, MD;  Location: [facility];  Service: Endoscopy;  Laterality: N/A;  730-moved to 1:00 Ann notified pt  . EMBOLECTOMY Right 02/01/2016   Procedure: Thrombectomy of Right Femoral-Popliteal Bypass Graft; Endarterectomy of Right Below Knee Popliteal Artery and Tibial Peroneal Trunk with Bovine Pericardium Patch Angioplasty.;  Surgeon: Angelia Mould, MD;  Location: [facility];  Service: Vascular;  Laterality: Right;  . FEMORAL-POPLITEAL BYPASS GRAFT Right 01/31/2016   Procedure: BYPASS GRAFT RIGHT COMMON  FEMORALTO BELOW KNEE POPLITEAL ARTERY BYPASS GRAFT USING 6MM PROPATEN GORTEX GRAFT;  Surgeon: Mal Misty, MD;  Location: [facility];  Service: Vascular;  Laterality: Right;  . FEMORAL-POPLITEAL BYPASS GRAFT Right 01/09/2017   Procedure: THROMBECTOMY OF RIGHT FEMORAL-POPLITEAL  ARTERY BYPASS GRAFT; THROMBECTOMY RIGHT  TIBIAL VESSELS;  Surgeon: [name], MD;  Location: [facility];  Service: Vascular;  Laterality: Right;  . FRACTURE SURGERY    . ILIAC ARTERY STENT  07/2006   external iliac and Rt SFA stent 07/2006 AND THE LEFT WAS IN 2006  . KNEE ARTHROSCOPY WITH MEDIAL MENISECTOMY Left 03/27/2014   Procedure: left knee arthorscopy with medial chondraplasty of the medial femoral and patella, medial microfracture technique of medial femoral condyl;  Surgeon: Tobi Bastos, MD;  Location: [facility];  Service: Orthopedics;  Laterality: Left;  . LEFT HEART CATHETERIZATION WITH CORONARY ANGIOGRAM Right 04/27/2013   Procedure: LEFT HEART CATHETERIZATION WITH CORONARY ANGIOGRAM;  Surgeon: Leonie Man, MD;  Location: [facility] CATH LAB;  Service: Cardiovascular;  Laterality: Right;  . LOWER EXTREMITY ANGIOGRAM Right 01/31/2016   Procedure: INTRAOP RIGHT LOWER EXTREMITY ANGIOGRAM;  Surgeon: Mal Misty, MD;  Location: [facility];  Service: Vascular;  Laterality: Right;  .  ORIF WRIST FRACTURE Right 1983  . OVARIAN CYST REMOVAL Right   . PATCH ANGIOPLASTY Right 01/31/2016   Procedure: RIGHT COMMON FEMORAL AND PROFUNDA FEMORIS ENDARECTOMY WITH PATCH ANGIOPLASTY;  Surgeon: Mal Misty, MD;  Location: [facility];  Service: Vascular;  Laterality: Right;  . PATCH ANGIOPLASTY Right 01/09/2017   Procedure: PATCH ANGIOPLASTY RIGHT POPLITEAL ARTERY BYPASS GRAFT;  Surgeon: [name], MD;  Location: [facility];  Service: Vascular;  Laterality: Right;  . PERIPHERAL VASCULAR CATHETERIZATION N/A 06/27/2015   Procedure: Lower Extremity Angiography;  Surgeon: Lorretta Harp, MD;  Location: [facility] CV LAB;  Service: Cardiovascular;  Laterality: N/A;  . PERIPHERAL VASCULAR CATHETERIZATION Bilateral 01/29/2016   Procedure: Lower Extremity Angiography;  Surgeon: Wellington Hampshire, MD;  Location: [facility] CV LAB;  Service: Cardiovascular;  Laterality: Bilateral;  . PERIPHERAL VASCULAR CATHETERIZATION N/A 01/29/2016   Procedure: Abdominal Aortogram;  Surgeon: Wellington Hampshire, MD;  Location: [facility] CV LAB;  Service: Cardiovascular;  Laterality: N/A;  . REFRACTIVE SURGERY Bilateral    "6 in one eye; 7 in the other; to relieve pressure; not glaucoma" (01/28/2016)  . SFA Right  06/27/2015   overlapping Bahn covered stents  . TUBAL LIGATION  1983  . VEIN REPAIR Right 01/31/2016   Procedure: RIGHT GREATER SAPHENOUS VEIN EXAMNED BUT NOT REMOVED;  Surgeon: Mal Misty, MD;  Location: [facility];  Service: Vascular;  Laterality: Right;    OB History    No data available       Home Medications    Prior to Admission medications   Medication Sig Start Date End Date Taking? Authorizing Provider  acetaminophen (TYLENOL) 500 MG tablet Take 1,000 mg by mouth every 6 (six) hours as needed for moderate pain.     [provider]  apixaban (ELIQUIS) 5 MG TABS tablet Take 1 tablet (5 mg total) by mouth 2 (two) times daily. 01/12/17   Rhyne, Hulen Shouts, PA-C  aspirin EC 325 MG tablet Take  325 mg by mouth daily.    [provider]  carboxymethylcellulose (REFRESH PLUS) 0.5 % SOLN Place 1 drop into both eyes 3 (three) times daily as needed (dry eye).     [provider]  diltiazem (CARDIZEM CD) 180 MG 24 hr capsule Take 180 mg by mouth every morning.    [provider]  docusate sodium (COLACE) 100 MG capsule Take 200 mg by mouth every evening.     [provider]  ezetimibe (ZETIA) 10 MG tablet Take 1 tablet (10 mg total) by mouth at bedtime. PLEASE CONTACT OFFICE FOR ADDITIONAL REFILLS 03/08/17   Lorretta Harp, MD  fenofibrate (TRICOR) 145 MG tablet Take 1 tablet (145 mg total) by mouth at bedtime. PLEASE CONTACT OFFICE FOR ADDITIONAL REFILLS 03/08/17   Lorretta Harp, MD  flecainide (TAMBOCOR) 50 MG tablet Take 1 tablet (50 mg total) by mouth 2 (two) times daily. PLEASE CONTACT OFFICE FOR ADDITIONAL REFILLS 03/08/17   Lorretta Harp, MD  lisinopril (PRINIVIL,ZESTRIL) 20 MG tablet Take 20 mg by mouth daily.    [provider]  omeprazole (PRILOSEC) 20 MG capsule Take 20 mg by mouth 2 (two) times daily before a meal.     [provider]  traMADol (ULTRAM) 50 MG tablet Take 1 tablet (50 mg total) by mouth every 6 (six) hours as needed. Currently: taking med between 2-3 times daily for pain Patient taking differently: Take 100 mg by mouth every 6 (six) hours as needed. Currently: taking med between 2-3 times daily for pain 01/12/17   Gabriel Earing, PA-C    Family History Family History  Problem Relation Age of Onset  . CAD Mother   . Lung cancer Mother   . Heart attack Mother   . CAD Father   . Heart disease Father   . Heart attack Father 48       died in hes 7's with heart disease  . Healthy Sister   . Liver cancer Brother   . Healthy Sister   . CAD Brother        has had CABG  . CAD Brother     Social History Social History  Substance Use Topics  . Smoking status: Former Smoker    Packs/day: 1.00     Years: 45.00    Types: Cigarettes    Quit date: 11/11/2012  . Smokeless tobacco: Never Used     Comment: quit 4 years ago  . Alcohol use No     Allergies   Atenolol; Clopidogrel; Demerol [meperidine]; Inderal [propranolol]; Oxycodone-acetaminophen; Prednisone; Statins; Venlafaxine; Warfarin and related; Crestor [rosuvastatin]; Esomeprazole; Lactose intolerance (gi); Lipitor [atorvastatin]; Lovaza [  omega-3-acid ethyl esters]; Penicillins; Pravastatin; Simvastatin; Trilipix [choline fenofibrate]; Warfarin; Effexor [venlafaxine hcl]; Nexium [esomeprazole magnesium]; Percocet [oxycodone-acetaminophen]; and Plavix [clopidogrel bisulfate]   Review of Systems Review of Systems  Constitutional: Negative for fatigue.  Respiratory: Negative for apnea and cough.   Gastrointestinal: Negative for abdominal pain.     Physical Exam Updated Vital Signs BP (!) 199/81 (BP Location: Right Arm)   Pulse 64   Temp 97.7 F (36.5 C) (Oral)   Resp 16   SpO2 100%   Physical Exam  Constitutional: She is oriented to person, place, and time. She appears well-developed and well-nourished.  HENT:  Head: Normocephalic and atraumatic.  Eyes: Right eye exhibits no discharge. Left eye exhibits no discharge.  Cardiovascular: Normal rate and regular rhythm.   No murmur heard. Pulmonary/Chest: Effort normal and breath sounds normal. No respiratory distress.  Abdominal: Soft. There is no tenderness.  Musculoskeletal:  Right leg pale or than left. Cold the touch. No palpable pulse. No dopplerable pulse.  Neurological: She is oriented to person, place, and time.  Skin: Skin is warm and dry. She is not diaphoretic.  Psychiatric: She has a normal mood and affect.  Nursing note and vitals reviewed.    ED Treatments / Results  Labs (all labs ordered are listed, but only abnormal results are displayed) Labs Reviewed - No data to display  EKG  EKG Interpretation None       Radiology No results  found.  Procedures Procedures (including critical care time)  Medications Ordered in ED Medications - No data to display   Initial Impression / Assessment and Plan / ED Course  I have reviewed the triage vital signs and the nursing notes.  Pertinent labs & imaging results that were available during my care of the patient were reviewed by me and considered in my medical decision making (see chart for details).     Patient is a pleasant 66 year old female with extensive  Perripheral vascular disease. Patient here with cold pulseless foot. . Pt had right femo patient is a 66 year old female presenting with lip swelling. Patient did bite her lip last night at a barbecue. Sheral to below-knee popliteal bypass by Dr. Kellie Simmering February 2017 with a thrombectomy of this on 01/09/2017 by Oneida Alar.  Had receent ABI of .9 on that side done on 5/3.    Concern for acutely ischemic limb. Dr. Donnetta Hutching from vascular called and will take her to the OR.   CRITICAL CARE Performed by: Gardiner Sleeper Total critical care time: 59minutes Critical care time was exclusive of separately billable procedures and treating other patients. Critical care was necessary to treat or prevent imminent or life-threatening deterioration. Critical care was time spent personally by me on the following activities: development of treatment plan with patient and/or surrogate as well as nursing, discussions with consultants, evaluation of patient's response to treatment, examination of patient, obtaining history from patient or surrogate, ordering and performing treatments and interventions, ordering and review of laboratory studies, ordering and review of radiographic studies, pulse oximetry and re-evaluation of patient's condition.      Final Clinical Impressions(s) / ED Diagnoses   Final diagnoses:  None    New Prescriptions New Prescriptions   No medications on file     Macarthur Critchley, MD 05/18/17 1145

## 2017-05-17 NOTE — Progress Notes (Signed)
Patient ID: [name], female   DOB: 1951-08-17, 66 y.o.   MRN: [id number] As per history and physical from [facility] PA-C  Extremely extensive complex past history regarding right leg revascularization. Had 3 failed endovascular procedures with Dr. [name] including covered  stent in the superficial femoral artery. Underwent urgent right femoral to popliteal bypass with Dr. Kellie Simmering on 01/31/2016 for critical limb ischemia. Saphenous vein was visualized the time and felt to be inadequate and therefore underwent placement of a prosthetic Gore-Tex femoral to below-knee popliteal bypass. Had a right common femoral endarterectomy and patch the time the surgery with the proximal anastomosis arising from the patch. He is complication of thrombosis of this on day 1 was taken back to the operating room by Dr. Scot Dock on 1211 where she underwent thrombectomy of this. She had endarterectomy of her popliteal artery extending onto her tibioperoneal trunk and had bovine pericardial patch and the graft was then sewn into this. She presented on 01/09/2017 with occlusion of her graft and was taken to the operating room by Dr. Oneida Alar. She underwent thrombectomy of her graft and also thrombectomy of the anterior tibial and tibioperoneal trunk. Intraoperative arteriogram at that time showed significant progression of her tibial vessel disease with occlusion of her perineal and occlusion of her anterior tibial at the ankle with the posterior tibial being the main runoff vessel to the foot. She had been seen in our office on 04/22/2017. That time she had normal ankle arm index and normal graft scan with an ankle arm index of 0.9. Her left ankle arm index was 0.68. She now presents with obvious acute occlusion this morning with no motor or sensory function in her foot. Her foot is ice cold and pale. She is in severe pain.  Abdomen very frank discussion with patient. Explained that the this is now her seventh intervention for  limb salvage. Explained that with her progression of tibial disease that this becomes more difficult with each procedure. The patient reports that she had a prior left leg vein mapping. I will image her saphenous vein on the left. Although she does have known left at the SFA occlusion she only has mild claudication. If she has a viable left saphenous vein will plan left vein harvest with right down popliteal or tibial bypass. If no great or small saphenous vein is available, will repeat thrombectomized. Unfortunately the patient is also on Elocon was to help maintain graft patency which is also not been successful. Patient understands very high risk of eventual need for amputation. We'll proceed with emergent surgery this morning

## 2017-05-17 NOTE — Anesthesia Postprocedure Evaluation (Signed)
Anesthesia Post Note  Patient: [name]  Procedure(s) Performed: Procedure(s) (LRB): RIGHT FEMORAL-TIBIAL PERONEAL TRUNK ARTERY BYPASS GRAFT USING 63mmX80cm PROPATEN GRAFT WITH REMOVABLE RING (Right) LEFT LEG GREATER SAPHENOUS VEIN HARVEST (Left)  Patient location during evaluation: PACU Anesthesia Type: General Level of consciousness: awake and alert Pain management: pain level controlled Vital Signs Assessment: post-procedure vital signs reviewed and stable Respiratory status: spontaneous breathing, nonlabored ventilation, respiratory function stable and patient connected to nasal cannula oxygen Cardiovascular status: blood pressure returned to baseline and stable Postop Assessment: no signs of nausea or vomiting Anesthetic complications: no       Last Vitals:  Vitals:   05/17/17 1722 05/17/17 1830  BP: (!) 112/58 116/62  Pulse: 78 (!) 58  Resp: 12 14  Temp: 36.2 C 36.6 C    Last Pain:  Vitals:   05/17/17 1830  TempSrc: Oral  PainSc:                  [name]

## 2017-05-17 NOTE — ED Notes (Signed)
Patient belongings taken to short stay with patient. Including clothing, bookbag with personal belongings in it and cell phone. Pt has notified family.

## 2017-05-17 NOTE — ED Notes (Signed)
No pulse able to be assessed to right foot with doppler.

## 2017-05-17 NOTE — Anesthesia Procedure Notes (Signed)
Procedure Name: Intubation Date/Time: 05/17/2017 10:38 AM Performed by: Suzy Bouchard Pre-anesthesia Checklist: Patient identified, Emergency Drugs available, Suction available, Patient being monitored and Timeout performed Patient Re-evaluated:Patient Re-evaluated prior to inductionOxygen Delivery Method: Circle system utilized Preoxygenation: Pre-oxygenation with 100% oxygen Intubation Type: IV induction Ventilation: Mask ventilation without difficulty Laryngoscope Size: Miller and 2 Grade View: Grade I Tube type: Oral Tube size: 7.5 mm Number of attempts: 1 Airway Equipment and Method: Stylet Placement Confirmation: positive ETCO2,  ETT inserted through vocal cords under direct vision and breath sounds checked- equal and bilateral Secured at: 21 cm Tube secured with: Tape Dental Injury: Teeth and Oropharynx as per pre-operative assessment

## 2017-05-17 NOTE — Consult Note (Signed)
Hospital Consult    Reason for Consult:  Pain in right foot/leg Requesting Physician:  ED MRN #:  [id number]  History of Present Illness: This is a 66 y.o. female who presented to the ED today with complaints of right foot and leg pain.  She states that about a week ago, she started having some pains in her foot when she would wake, but she didn't think much of it because it would improve once she was up moving around.  She states that this morning between 0630 and 0700, she had pain in her left foot that did not get better.  Her pain is now constant.  She cannot feel or move her right toes.  She says that last night when she went to bed, her right foot looked like the left foot.  Nothing really makes it better.  She states she does have some cramping in the left calf with walking and improves when she rests.    On 01/31/16, she underwent right femoral to below knee bypass with propaten; right common femoral endarterectomy with patch angioplasty.  Her saphenous vein was examined, but was inadequate.  The next evening, her bypass occluded and she was taken back to the OR and underwent thrombectomy of the bypass graft, endarterectomy of the below knee popliteal and tibial peroneal trunk and bovine pericardial patch angioplasty of the BK artery and tibial peroneal.  Earlier this year, she presented with an ischemic right leg and was taken to the OR and underwent thrombectomy of the right bypass graft and thrombectomy of the tibial vessels and patch angioplasty of the right popliteal artery bypass graft.   She also has a hx of stenting of the right SFA by Dr. [name].  She does have a persistent leukocytosis and was diagnosed with polycythemia vera.  She is on Eliquis bid and aspirin. She also has a hx of PAF.  She has CAD with hx of STEMI in May 2014.    Marland KitchenShe also takes a daily aspirin.   She takes Zetia and Statistician for cholesterol management.  She is on an ACEI and CCB for blood pressure control.       Past Medical History:  Diagnosis Date  . Arthritis    "fingers, some in my knees" (01/28/2016)  . CAD (coronary artery disease), per cath 04/27/13, non obstructive disease, EF 65-70% 05/11/2013  . Carotid disease, bilateral ([place]) 09/2013   Lt carotid 50-69% stentosis, less on rt  . Dysrhythmia   . Fatty liver   . GERD (gastroesophageal reflux disease)   . Glaucoma   . H/O cardiovascular stress test 12/27/2009   negative for ischemia  . Headache    "occasional since eye pressure regulated" (01/28/2016)  . Hyperlipidemia   . Hypertension   . Leukocytosis 07/15/2015  . Migraine    "stopped w/laser holes to relieve pressure in my eyes; had them 3-4 times/wk before taht" (01/28/2016)  . Mitral regurgitation,2+ by cath 05/11/2013  . NSTEMI (non-ST elevated myocardial infarction) ([place]) 04/27/2013  . Pain    BOTH KNEES - PT HAS TORN MENISCUS LEFT KNEE  . Paroxysmal atrial fibrillation (HCC)    hx of a fib on ASA  AND ELIQUIS   . Polycythemia vera ([place]) 08/20/2015   JAK-2 positive 07/19/15  . PVD (peripheral vascular disease) with claudication ([place]) 07/2006   previous L ext iliac stent and rt SFA stent, known occluded Lt SFA  . S/P cardiac cath 04/27/13   NON OBSTRUCTIVE DISEASE, 2+ mr, ef 65-70%  .  Statin intolerance   . Stroke ([place]) 2006   SWELLING ALL OVER AND RT SIDE OF FACE DRAWN AND SPEECH SLURRED AND NUMBNESS ON RIGHT SIDE-- ALL RESOLVED    Past Surgical History:  Procedure Laterality Date  . CARDIAC CATHETERIZATION  04/27/13   non occlusive disease with mild to mod. calcified lesions in ostial LM and proximal LAD and moderate prox. RC AND DISTAL AV GROOVE LCX, ef  . CATARACT EXTRACTION W/PHACO Right 03/21/2015   Procedure: CATARACT EXTRACTION PHACO AND INTRAOCULAR LENS PLACEMENT RIGHT EYE;  Surgeon: Baruch Goldmann, MD;  Location: [facility];  Service: Ophthalmology;  Laterality: Right;  CDE:5.10  . CATARACT EXTRACTION W/PHACO Left 05/16/2015   Procedure: CATARACT EXTRACTION PHACO AND  INTRAOCULAR LENS PLACEMENT (IOC);  Surgeon: Baruch Goldmann, MD;  Location: [facility];  Service: Ophthalmology;  Laterality: Left;  CDE 5.57  . [place]  . COLONOSCOPY N/A 09/11/2016   Procedure: COLONOSCOPY;  Surgeon: Rogene Houston, MD;  [facility];  Service: Endoscopy;  Laterality: N/A;  730-moved to 1:00 Ann notified pt  . EMBOLECTOMY Right 02/01/2016   Procedure: Thrombectomy of Right Femoral-Popliteal Bypass Graft; Endarterectomy of Right Below Knee Popliteal Artery and Tibial Peroneal Trunk with Bovine Pericardium Patch Angioplasty.;  Surgeon: Angelia Mould, MD;  Location: [place];  Service: Vascular;  Laterality: Right;  . FEMORAL-POPLITEAL BYPASS GRAFT Right 01/31/2016   Procedure: BYPASS GRAFT RIGHT COMMON  FEMORALTO BELOW KNEE POPLITEAL ARTERY BYPASS GRAFT USING 6MM PROPATEN GORTEX GRAFT;  Surgeon: Mal Misty, MD;  Location: [facility];  Service: Vascular;  Laterality: Right;  . FEMORAL-POPLITEAL BYPASS GRAFT Right 01/09/2017   Procedure: THROMBECTOMY OF RIGHT FEMORAL-POPLITEAL  ARTERY BYPASS GRAFT; THROMBECTOMY RIGHT  TIBIAL VESSELS;  Surgeon: [name], MD;  Location: [facility];  Service: Vascular;  Laterality: Right;  . FRACTURE SURGERY    . ILIAC ARTERY STENT  07/2006   external iliac and Rt SFA stent 07/2006 AND THE LEFT WAS IN 2006  . KNEE ARTHROSCOPY WITH MEDIAL MENISECTOMY Left 03/27/2014   Procedure: left knee arthorscopy with medial chondraplasty of the medial femoral and patella, medial microfracture technique of medial femoral condyl;  Surgeon: Tobi Bastos, MD;  Location: [facility];  Service: Orthopedics;  Laterality: Left;  . LEFT HEART CATHETERIZATION WITH CORONARY ANGIOGRAM Right 04/27/2013   Procedure: LEFT HEART CATHETERIZATION WITH CORONARY ANGIOGRAM;  Surgeon: Leonie Man, MD;  Location: [facility] C[facility];  Service: Cardiovascular;  Laterality: Right;  . LOWER EXTREMITY ANGIOGRAM Right 01/31/2016   Procedure: INTRAOP RIGHT LOWER EXTREMITY ANGIOGRAM;   Surgeon: Mal Misty, MD;  Location: [place];  Service: Vascular;  Laterality: Right;  . ORIF WRIST FRACTURE Right 1983  . OVARIAN CYST REMOVAL Right   . PATCH ANGIOPLASTY Right 01/31/2016   Procedure: RIGHT COMMON FEMORAL AND PROFUNDA FEMORIS ENDARECTOMY WITH PATCH ANGIOPLASTY;  Surgeon: Mal Misty, MD;  Location: [place];  Service: Vascular;  Laterality: Right;  . PATCH ANGIOPLASTY Right 01/09/2017   Procedure: PATCH ANGIOPLASTY RIGHT POPLITEAL ARTERY BYPASS GRAFT;  Surgeon: [name], MD;  Location: [place];  Service: Vascular;  Laterality: Right;  . PERIPHERAL VASCULAR CATHETERIZATION N/A 06/27/2015   Procedure: Lower Extremity Angiography;  Surgeon: Lorretta Harp, MD;  Location: [place] CV LAB;  Service: Cardiovascular;  Laterality: N/A;  . PERIPHERAL VASCULAR CATHETERIZATION Bilateral 01/29/2016   Procedure: Lower Extremity Angiography;  Surgeon: Wellington Hampshire, MD;  Location: [place] CV LAB;  Service: Cardiovascular;  Laterality: Bilateral;  .  PERIPHERAL VASCULAR CATHETERIZATION N/A 01/29/2016   Procedure: Abdominal Aortogram;  Surgeon: Wellington Hampshire, MD;  Location: [place] CV LAB;  Service: Cardiovascular;  Laterality: N/A;  . REFRACTIVE SURGERY Bilateral    "6 in one eye; 7 in the other; to relieve pressure; not glaucoma" (01/28/2016)  . SFA Right 06/27/2015   overlapping Bahn covered stents  . TUBAL LIGATION  1983  . VEIN REPAIR Right 01/31/2016   Procedure: RIGHT GREATER SAPHENOUS VEIN EXAMNED BUT NOT REMOVED;  Surgeon: Mal Misty, MD;  Location: [place];  Service: Vascular;  Laterality: Right;    Allergies  Allergen Reactions  . Atenolol Rash and Itching  . Clopidogrel Itching  . Demerol [Meperidine] Other (See Comments) and Itching    Unknown, can't remember  Unknown, can't remember   . Inderal [Propranolol] Other (See Comments) and Itching    Doesn't remember   . Oxycodone-Acetaminophen Itching  . Prednisone Other (See Comments)    Muscle spasms  .  Statins Itching and Other (See Comments)    Simvastatin-caused severe itching Pravastatin-caused lesser tiching One type of statin? Caused stroke in 2006, and other symptoms as result  . Venlafaxine Itching  . Warfarin And Related Other (See Comments)    Caused nose bleeds  . Crestor [Rosuvastatin] Itching and Rash  . Esomeprazole Rash  . Lactose Intolerance (Gi) Other (See Comments)    Bloating, gas  . Lipitor [Atorvastatin] Rash  . Lovaza [Omega-3-Acid Ethyl Esters] Other (See Comments)    nosebleeds Doesn't remember   . Penicillins Hives, Other (See Comments) and Rash    Questionable high fever when taken and broke out in whelps.   . Pravastatin Itching and Rash  . Simvastatin Itching, Rash and Other (See Comments)    Also lipitor intolerant  . Trilipix [Choline Fenofibrate] Other (See Comments)    Doesn't remember   . Warfarin Other (See Comments)    nosebleeds  . Effexor [Venlafaxine Hcl] Other (See Comments)    Doesn't remember   . Nexium [Esomeprazole Magnesium] Rash  . Percocet [Oxycodone-Acetaminophen] Other (See Comments)    Doesn't remember   . Plavix [Clopidogrel Bisulfate] Rash    Prior to Admission medications   Medication Sig Start Date End Date Taking? Authorizing Provider  acetaminophen (TYLENOL) 500 MG tablet Take 1,000 mg by mouth every 6 (six) hours as needed for moderate pain.     [provider]  apixaban (ELIQUIS) 5 MG TABS tablet Take 1 tablet (5 mg total) by mouth 2 (two) times daily. 01/12/17   Caeleb Batalla, Hulen Shouts, PA-C  aspirin EC 325 MG tablet Take 325 mg by mouth daily.    [provider]  carboxymethylcellulose (REFRESH PLUS) 0.5 % SOLN Place 1 drop into both eyes 3 (three) times daily as needed (dry eye).     [provider]  diltiazem (CARDIZEM CD) 180 MG 24 hr capsule Take 180 mg by mouth every morning.    [provider]  docusate sodium (COLACE) 100 MG capsule Take 200 mg by mouth every evening.     [provider]  ezetimibe (ZETIA) 10 MG tablet Take 1 tablet (10 mg total) by mouth at bedtime. PLEASE CONTACT OFFICE FOR ADDITIONAL REFILLS 03/08/17   Lorretta Harp, MD  fenofibrate (TRICOR) 145 MG tablet Take 1 tablet (145 mg total) by mouth at bedtime. PLEASE CONTACT OFFICE FOR ADDITIONAL REFILLS 03/08/17   Lorretta Harp, MD  flecainide (TAMBOCOR) 50 MG tablet Take 1 tablet (50 mg total) by  mouth 2 (two) times daily. PLEASE CONTACT OFFICE FOR ADDITIONAL REFILLS 03/08/17   Lorretta Harp, MD  lisinopril (PRINIVIL,ZESTRIL) 20 MG tablet Take 20 mg by mouth daily.    [provider]  omeprazole (PRILOSEC) 20 MG capsule Take 20 mg by mouth 2 (two) times daily before a meal.     [provider]  traMADol (ULTRAM) 50 MG tablet Take 1 tablet (50 mg total) by mouth every 6 (six) hours as needed. Currently: taking med between 2-3 times daily for pain Patient taking differently: Take 100 mg by mouth every 6 (six) hours as needed. Currently: taking med between 2-3 times daily for pain 01/12/17   Gabriel Earing, PA-C    Social History   Social History  . Marital status: Widowed    Spouse name: N/A  . Number of children: 2  . Years of education: 12   Occupational History  . CNA    Social History Main Topics  . Smoking status: Former Smoker    Packs/day: 1.00    Years: 45.00    Types: Cigarettes    Quit date: 11/11/2012  . Smokeless tobacco: Never Used     Comment: quit 4 years ago  . Alcohol use No  . Drug use: No  . Sexual activity: Not Currently    Birth control/ protection: None   Other Topics Concern  . Not on file   Social History Narrative  . No narrative on file     Family History  Problem Relation Age of Onset  . CAD Mother   . Lung cancer Mother   . Heart attack Mother   . CAD Father   . Heart disease Father   . Heart attack Father 43       died in hes 58's with heart disease  . Healthy Sister   . Liver cancer Brother   . Healthy Sister    . CAD Brother        has had CABG  . CAD Brother     ROS: [x]  Positive   [ ]  Negative   [ ]  All sytems reviewed and are negative  Cardiac: []  chest pain/pressure []  palpitations []  SOB lying flat []  DOE  Vascular: [x]  pain in left leg while walking & improved with rest [x]  pain in right leg & foot at rest [x]  pain in legs at night [x]  hx PAF []  non-healing ulcers []  hx of DVT []  swelling in legs  Pulmonary: []  productive cough []  asthma/wheezing []  home O2  Neurologic: []  weakness in []  arms []  legs []  numbness in []  arms []  legs [x]  hx of CVA [x]  mini stroke 2006 [] difficulty speaking or slurred speech []  temporary loss of vision in one eye []  dizziness  Hematologic: []  hx of cancer []  bleeding problems [x]  problems with blood clotting easily [x]  polycythemia vera [x]  leukocytosis   Endocrine:   []  diabetes []  thyroid disease  GI []  vomiting blood []  blood in stool  GU: []  CKD/renal failure []  HD--[]  M/W/F or []  T/T/S []  burning with urination []  blood in urine  Psychiatric: []  anxiety []  depression  Musculoskeletal: []  arthritis []  joint pain  Integumentary: []  rashes []  ulcers  Constitutional: []  fever []  chills   Physical Examination  Vitals:   05/17/17 0903  BP: (!) 199/81  Pulse: 64  Resp: 16  Temp: 97.7 F (36.5 C)   There is no height or weight on file to calculate BMI.  General:  WDWN in NAD  Gait: Not observed HENT: WNL, normocephalic Pulmonary: normal non-labored breathing, without Rales, rhonchi,  wheezing Cardiac: regular, without  Murmurs, rubs or gallops; without carotid bruits Abdomen:  soft, NT/ND, no masses Skin: without rashes Vascular Exam/Pulses:  Right Left  Radial 2+ (normal) 2+ (normal)  Femoral 2+ (normal) 2+ (normal)  Popliteal Unable to palpate  Unable to palpate   DP Unable to palpate Unable to obtain doppler signal Unable to palpate   PT Unable to palpate Unable to obtain doppler signal  Unable to palpate    Extremities: with ischemic changes-the right foot is pallor; motor and sensation are not in tact right foot.  It is cool to touch compared to the left foot. Musculoskeletal: no muscle wasting or atrophy  Neurologic: A&O X 3;  No focal weakness or paresthesias are detected; speech is fluent/normal Psychiatric:  The pt has Normal affect.   CBC    Component Value Date/Time   WBC 25.4 (H) 05/17/2017 0915   RBC 6.85 (H) 05/17/2017 0915   HGB 14.4 05/17/2017 0915   HCT 49.9 (H) 05/17/2017 0915   HCT 30.6 (L) 03/09/2017 1200   PLT 338 05/17/2017 0915   PLT 429 (H) 03/09/2017 1200   MCV 72.8 (L) 05/17/2017 0915   MCV 62 (L) 03/09/2017 1200   MCH 21.0 (L) 05/17/2017 0915   MCHC 28.9 (L) 05/17/2017 0915   RDW 22.7 (H) 05/17/2017 0915   RDW 19.9 (H) 03/09/2017 1200   LYMPHSABS PENDING 05/17/2017 0915   LYMPHSABS 1.1 03/09/2017 1200   MONOABS PENDING 05/17/2017 0915   EOSABS PENDING 05/17/2017 0915   EOSABS 0.7 (H) 03/09/2017 1200   BASOSABS PENDING 05/17/2017 0915   BASOSABS 0.2 03/09/2017 1200    BMET    Component Value Date/Time   NA 141 05/17/2017 0915   K 4.9 05/17/2017 0915   CL 107 05/17/2017 0915   CO2 23 05/17/2017 0915   GLUCOSE 108 (H) 05/17/2017 0915   BUN 19 05/17/2017 0915   CREATININE 0.92 05/17/2017 0915   CREATININE 1.33 (H) 03/03/2016 1531   CALCIUM 9.3 05/17/2017 0915   GFRNONAA >60 05/17/2017 0915   GFRAA >60 05/17/2017 0915    COAGS: Lab Results  Component Value Date   INR 1.19 05/17/2017   INR 1.07 01/09/2017   INR 1.09 01/31/2016     Non-Invasive Vascular Imaging:   none  Statin:  No. Beta Blocker:  No. Aspirin:  Yes.   ACEI:  Yes.   ARB:  No. CCB use:  Yes Other antiplatelets/anticoagulants:  Yes.   Eliquis bid (last dose this am)   ASSESSMENT/PLAN: This is a 66 y.o. female with ischemic right leg   -pt has extensive hx of ischemia and interventions on the right leg.  Plan to take pt emergently to the operating  room for possible thrombectomy, possible right femoral to popliteal bypass grafting, possible saphenous vein harvest from right and/or left leg. -Dr. Donnetta Hutching had extensive discussion with pt that she is at high risk for losing her leg and requiring amputation and she expresses understanding.  -pt does have a persistent leukocytosis that is due to polycythemia vera that is followed by hem/onc.     Leontine Locket, PA-C [facility] [phone]

## 2017-05-18 ENCOUNTER — Encounter (HOSPITAL_COMMUNITY): Payer: Self-pay | Admitting: Vascular Surgery

## 2017-05-18 LAB — CBC
HCT: 30 % — ABNORMAL LOW (ref 36.0–46.0)
Hemoglobin: 8.5 g/dL — ABNORMAL LOW (ref 12.0–15.0)
MCH: 20.9 pg — AB (ref 26.0–34.0)
MCHC: 28.3 g/dL — ABNORMAL LOW (ref 30.0–36.0)
MCV: 73.9 fL — AB (ref 78.0–100.0)
PLATELETS: 341 10*3/uL (ref 150–400)
RBC: 4.06 MIL/uL (ref 3.87–5.11)
RDW: 23.3 % — ABNORMAL HIGH (ref 11.5–15.5)
WBC: 23.1 10*3/uL — AB (ref 4.0–10.5)

## 2017-05-18 LAB — BASIC METABOLIC PANEL
Anion gap: 8 (ref 5–15)
BUN: 17 mg/dL (ref 6–20)
CALCIUM: 7.4 mg/dL — AB (ref 8.9–10.3)
CO2: 20 mmol/L — ABNORMAL LOW (ref 22–32)
CREATININE: 1.02 mg/dL — AB (ref 0.44–1.00)
Chloride: 111 mmol/L (ref 101–111)
GFR calc Af Amer: >60 mL/min (ref >60)
GFR, EST NON AFRICAN AMERICAN: 56 mL/min — AB (ref >60)
GLUCOSE: 100 mg/dL — AB (ref 65–99)
Potassium: 4.9 mmol/L (ref 3.5–5.1)
SODIUM: 139 mmol/L (ref 135–145)

## 2017-05-18 NOTE — Progress Notes (Addendum)
  [facility] Progress Note  Subjective  - POD #1  Right foot feels betters. Feeling coming back.  Objective Vitals:   05/18/17 0335 05/18/17 0400  BP: (!) 106/52 (!) 112/50  Pulse: 89 93  Resp: 12 19  Temp: 99.1 F (37.3 C) 99.1 F (37.3 C)    Intake/Output Summary (Last 24 hours) at 05/18/17 0737 Last data filed at 05/18/17 0400  Gross per 24 hour  Intake             3700 ml  Output             2105 ml  Net             1595 ml    Right groin and leg incisions intact. JP drains with serosanguinous output. Left thigh incisions dressed with dried drainage on dressing Moving right toes. Right PT doppler signal.   Assessment/Planning: 66 y.o. female is s/p: Right femoral to tibioperoneal trunk bypass with composite Gore-Tex graft and great saphenous vein from the left thigh 1 Day Post-Op   Rest pain improved this am. Bypass is patent. Patient understands that if her bypass fails Kiwanna Spraker, there are likely no further attempts at revascularization.  Keep JP drains in today. Continue to hold Eliquis. ABLA: monitor.  Patient with known polycythemia. D/c foley and ambulate. Transfer to [facility] 05/18/2017 7:37 AM --  Laboratory CBC    Component Value Date/Time   WBC 23.1 (H) 05/18/2017 0346   HGB 8.5 (L) 05/18/2017 0346   HCT 30.0 (L) 05/18/2017 0346   HCT 30.6 (L) 03/09/2017 1200   PLT 341 05/18/2017 0346   PLT 429 (H) 03/09/2017 1200    BMET    Component Value Date/Time   NA 139 05/18/2017 0346   K 4.9 05/18/2017 0346   CL 111 05/18/2017 0346   CO2 20 (L) 05/18/2017 0346   GLUCOSE 100 (H) 05/18/2017 0346   BUN 17 05/18/2017 0346   CREATININE 1.02 (H) 05/18/2017 0346   CREATININE 1.33 (H) 03/03/2016 1531   CALCIUM 7.4 (L) 05/18/2017 0346   GFRNONAA 56 (L) 05/18/2017 0346   GFRAA >60 05/18/2017 0346    COAG Lab Results  Component Value Date   INR 1.19 05/17/2017   INR 1.07 01/09/2017   INR 1.09 01/31/2016   No results  found for: PTT  Antibiotics Anti-infectives    Start     Dose/Rate Route Frequency Ordered Stop   05/17/17 1900  vancomycin (VANCOCIN) IVPB 1000 mg/200 mL premix     1,000 mg 200 mL/hr over 60 Minutes Intravenous Every 12 hours 05/17/17 1721 05/18/17 0719       Virgina Jock, PA-C [facility] Office: [phone] Pager: [phone] 05/18/2017 7:37 AM   I have examined the patient, reviewed and agree with above. Looks quite good this morning. Motor and sensory function returned to her right foot. Mild soreness. No large hematomas. Patient was on analogous up until surgery. Good posterior tibial signal in her foot. Will transfer to [facility] and begin to mobilize. Curt Jews, MD 05/18/2017 8:03 AM

## 2017-05-18 NOTE — Evaluation (Signed)
Physical Therapy Evaluation Patient Details Name: [name] MRN: [id number] DOB: [birth year] Today's Date: 05/18/2017   History of Present Illness  66 yo female s/p R femoral to tibioperoneal trunk bypass with composite gore-tex graft and great saphenous vein from the left thigh. PMH: arthritis, CAD, GERD, glaucoma, HTN, NSTEMI, PVD, CVA  Clinical Impression  Patient presents with decreased independence with mobility due to pain and limited ROM in R LE.  She will benefit from skilled PT in the acute setting to allow return home with family support and follow up HHPT.  Will also benefit from nursing assist for ambulation to bathroom and in hallway as tolerated.     Follow Up Recommendations Home health PT    Equipment Recommendations  None recommended by PT    Recommendations for Other Services       Precautions / Restrictions Precautions Precautions: Fall Precaution Comments: watch BP Restrictions Weight Bearing Restrictions: No      Mobility  Bed Mobility               General bed mobility comments: up in chair following OT  Transfers Overall transfer level: Needs assistance Equipment used: Rolling walker (2 wheeled) Transfers: Sit to/from Stand Sit to Stand: Min guard         General transfer comment: appropriate hand placement without cues, assist for safety, lines  Ambulation/Gait Ambulation/Gait assistance: Min guard;Supervision Ambulation Distance (Feet): 25 Feet Assistive device: Rolling walker (2 wheeled) Gait Pattern/deviations: Step-to pattern;Decreased step length - left;Decreased stance time - right;Antalgic     General Gait Details: Patient with limited tolerance due to foot flat so mainly toe touch on R, but worked on lowering heel as able  Financial trader [place] (Stroke Patients Only)       Balance Overall balance assessment: Needs assistance   Sitting balance-Leahy Scale: Good      Standing balance support: Bilateral upper extremity supported Standing balance-Leahy Scale: Poor Standing balance comment: Needs UE support for balance due to pain and decreased balance                             Pertinent Vitals/Pain Pain Assessment: 0-10 Pain Score: 8  Pain Location: back and R groining area Pain Descriptors / Indicators: Constant;Sore;Tightness;Dull;Aching Pain Intervention(s): Monitored during session;Repositioned;Premedicated before session    Home Living Family/patient expects to be discharged to:: Private residence Living Arrangements: Other relatives (71 y/o Curator to assist for 2 weeks, next door neighbor is a Quarry manager) Available Help at Discharge: Family;Friend(s);Available PRN/intermittently Type of Home: House Home Access: Stairs to enter Entrance Stairs-Rails: Psychiatric nurse of Steps: 4 Home Layout: One level Home Equipment: Walker - 2 wheels;Bedside commode;Shower seat;Grab bars - tub/shower;Hand held shower head;Adaptive equipment Additional Comments: pt uses BSC in tub shower and states she can sit on BSC and then swing legs into tub from there.     Prior Function Level of Independence: Independent      ADL's / Homemaking Assistance Needed: has a neighbor that is a CNA that can (A) if she needs to call her. Her name is "wanda"  Comments: has 2 grandchildren that can help her after they get out of school     Hand Dominance   Dominant Hand: Right    Extremity/Trunk Assessment   Upper Extremity Assessment Upper Extremity Assessment: Defer to OT evaluation  Lower Extremity Assessment Lower Extremity Assessment: RLE deficits/detail RLE Deficits / Details: limited knee and hip flexion due to pain, moves antigravity, otherwise NT       Communication   Communication: No difficulties  Cognition Arousal/Alertness: Awake/alert Behavior During Therapy: WFL for tasks assessed/performed Overall Cognitive  Status: Within Functional Limits for tasks assessed                                        General Comments General comments (skin integrity, edema, etc.): encouraged ambulation to bathroom with nursing staff today    Exercises     Assessment/Plan    PT Assessment Patient needs continued PT services  PT Problem List Decreased activity tolerance;Decreased knowledge of use of DME;Pain;Decreased range of motion;Decreased mobility       PT Treatment Interventions DME instruction;Gait training;Balance training;Stair training;Functional mobility training;Therapeutic exercise;Patient/family education;Therapeutic activities    PT Goals (Current goals can be found in the Care Plan section)  Acute Rehab PT Goals Patient Stated Goal: To return to independent PT Goal Formulation: With patient Time For Goal Achievement: 05/22/17 Potential to Achieve Goals: Good    Frequency Min 3X/week   Barriers to discharge        Co-evaluation               AM-PAC PT "6 Clicks" Daily Activity  Outcome Measure Difficulty turning over in bed (including adjusting bedclothes, sheets and blankets)?: None Difficulty moving from lying on back to sitting on the side of the bed? : A Little Difficulty sitting down on and standing up from a chair with arms (e.g., wheelchair, bedside commode, etc,.)?: Total Help needed moving to and from a bed to chair (including a wheelchair)?: A Little Help needed walking in hospital room?: A Little Help needed climbing 3-5 steps with a railing? : A Lot 6 Click Score: 16    End of Session Equipment Utilized During Treatment: Gait belt Activity Tolerance: Patient tolerated treatment well Patient left: in bed Nurse Communication: Mobility status PT Visit Diagnosis: Difficulty in walking, not elsewhere classified (R26.2);Pain Pain - Right/Left: Right Pain - part of body: Leg    Time: 4235-3614 PT Time Calculation (min) (ACUTE ONLY): 13  min   Charges:   PT Evaluation $PT Eval Moderate Complexity: 1 Procedure     PT G CodesMagda Kiel, PT [phone] 05/18/2017   Reginia Naas 05/18/2017, 11:10 AM

## 2017-05-18 NOTE — Progress Notes (Signed)
@  2218 Report attempted but receiving RN presently tied up with pt care.

## 2017-05-18 NOTE — Evaluation (Signed)
Occupational Therapy Evaluation Patient Details Name: [name] MRN: [id number] DOB: 1951-08-04 Today's Date: 05/18/2017    History of Present Illness 66 yo female s/p R femoral to tibioperoneal trunk bypass with composite gore-tex graft and great saphenous vein from the left thigh. PMH: arthritis, CAD, GERD, glaucoma, HTN, NSTEMI, PVD, CVA   Clinical Impression   PT admitted with s/p femoral to tiboperoneal trunk bypass. Pt currently with functional limitiations due to the deficits listed below (see OT problem list). PTA was home independent and caregiver for 26 yo granddaughter.  Pt will benefit from skilled OT to increase their independence and safety with adls and balance to allow discharge [place].     Follow Up Recommendations  Home health OT    Equipment Recommendations  None recommended by OT    Recommendations for Other Services       Precautions / Restrictions Precautions Precautions: Fall Precaution Comments: watch BP      Mobility Bed Mobility Overal bed mobility: Modified Independent             General bed mobility comments: increased time and use of bed rail to roll initially  Transfers Overall transfer level: Needs assistance Equipment used: Rolling walker (2 wheeled) Transfers: Sit to/from Stand Sit to Stand: Min assist         General transfer comment: cues for hand placement and to power up    Balance Overall balance assessment: Needs assistance   Sitting balance-Leahy Scale: Good     Standing balance support: Bilateral upper extremity supported Standing balance-Leahy Scale: Poor Standing balance comment: relies on RW                           ADL either performed or assessed with clinical judgement   ADL Overall ADL's : Needs assistance/impaired Eating/Feeding: Independent   Grooming: Wash/dry hands   Upper Body Bathing: Independent   Lower Body Bathing: Maximal assistance   Upper Body Dressing : Independent    Lower Body Dressing: Maximal assistance Lower Body Dressing Details (indicate cue type and reason): pt unable to (A) with don of socks. pt reports bottom of feet is very sensitive Toilet Transfer: Minimal assistance;RW;Stand-pivot           Functional mobility during ADLs: Minimal assistance;Rolling walker General ADL Comments: Pt educated on use of RW and sequence with R LE then L LE     Vision Baseline Vision/History: Wears glasses Wears Glasses: Reading only       Perception     Praxis      Pertinent Vitals/Pain Pain Location: back and R groining area Pain Descriptors / Indicators: Constant;Sore;Tightness;Dull;Aching     Hand Dominance Right   Extremity/Trunk Assessment Upper Extremity Assessment Upper Extremity Assessment: Overall WFL for tasks assessed   Lower Extremity Assessment Lower Extremity Assessment: Defer to PT evaluation   Cervical / Trunk Assessment Cervical / Trunk Assessment: Normal   Communication Communication Communication: No difficulties   Cognition Arousal/Alertness: Awake/alert Behavior During Therapy: WFL for tasks assessed/performed Overall Cognitive Status: Within Functional Limits for tasks assessed                                     General Comments  bandage over wound dressing due to drainage by RN    Exercises     Shoulder Instructions      Home Living Family/patient expects to  be discharged to:: Private residence Living Arrangements: Other relatives (40 yo granddaughter to leave in 2 weeks for [place] ) Available Help at Discharge: Family;Friend(s);Available PRN/intermittently Type of Home: House Home Access: Stairs to enter [employer] of Steps: 4 Entrance Stairs-Rails: Right;Left Home Layout: One level     Bathroom Shower/Tub: Teacher, early years/pre: Standard     Home Equipment: Environmental consultant - 2 wheels;Bedside commode;Shower seat;Grab bars - tub/shower;Hand held shower head;Adaptive  equipment Adaptive Equipment: Reacher Additional Comments: pt uses BSC in tub shower and states she can sit on BSC and then swing legs into tub from there.       Prior Functioning/Environment Level of Independence: Independent    ADL's / Homemaking Assistance Needed: has a neighbor that is a CNA that can (A) if she needs to call her. Her name is "wanda"   Comments: has 2 grandchildren that can help her after they get out of school        OT Problem List: Decreased strength;Decreased activity tolerance;Impaired balance (sitting and/or standing);Decreased safety awareness;Decreased knowledge of use of DME or AE;Decreased knowledge of precautions;Pain      OT Treatment/Interventions: Self-care/ADL training;Therapeutic exercise;DME and/or AE instruction;Energy conservation;Therapeutic activities;Patient/family education;Balance training    OT Goals(Current goals can be found in the care plan section) Acute Rehab OT Goals Patient Stated Goal: To return to independent OT Goal Formulation: With patient Time For Goal Achievement: 06/01/17 Potential to Achieve Goals: Good  OT Frequency: Min 2X/week   Barriers to D/C:            Co-evaluation              AM-PAC PT "6 Clicks" Daily Activity     Outcome Measure Help from another person eating meals?: None Help from another person taking care of personal grooming?: None Help from another person toileting, which includes using toliet, bedpan, or urinal?: A Little Help from another person bathing (including washing, rinsing, drying)?: A Little Help from another person to put on and taking off regular upper body clothing?: A Little Help from another person to put on and taking off regular lower body clothing?: A Lot 6 Click Score: 19   End of Session Equipment Utilized During Treatment: Gait belt;Rolling walker Nurse Communication: Mobility status;Precautions  Activity Tolerance: Patient tolerated treatment well Patient left: in  chair;with call bell/phone within reach;with chair alarm set  OT Visit Diagnosis: Unsteadiness on feet (R26.81)                Time: 3716-9678 OT Time Calculation (min): 39 min Charges:  OT General Charges $OT Visit: 1 Procedure OT Evaluation $OT Eval Moderate Complexity: 1 Procedure OT Treatments $Self Care/Home Management : 8-22 mins G-Codes:      Jeri Modena   OTR/L Pager: [phone] Office: [phone] .   Parke Poisson B 05/18/2017, 3:43 PM

## 2017-05-18 NOTE — Progress Notes (Signed)
New Admission Note:   Arrival Method: From 4E via bed Mental Orientation: A&O Telemetry: box 2w10 Assessment: Completed Skin: Intact IV: R FA, L FA Pain: denies any pain at this time Tubes: None Safety Measures: Safety Fall Prevention Plan has been discussed  Admission 2 [facility] Orientation: Patient has been orientated to the room, unit and staff.  Family: none present at bedside  Orders to be reviewed and implemented. Will continue to monitor the patient. Call light has been placed within reach and bed alarm has been activated. Tele box applied. CCMD notified    Isac Caddy, RN

## 2017-05-18 NOTE — Care Management Note (Signed)
Case Management Note  Patient Details  Name: [name] MRN: [id number] Date of Birth: Mar 31, 1951  Subjective/Objective:    From home with her granddaughter, pod  1  Right femoral to tibioperoneal trunk bypass, per pt/ot eval rec HHPT/HHOT,  Patient chose [facility]  From list, NCM made referral to [facility] for [place], OT.  Soc will begin 24-48 hrs post dc.  Has [employer] and Medicaid.   PCP is  [facility]           Action/Plan: NCM will follow for dc needs.  Expected Discharge Date:                  Expected Discharge Plan:  [place]  In-House Referral:     Discharge planning Services  CM Consult  Post Acute Care Choice:    Choice offered to:  Patient  DME Arranged:    DME Agency:     HH Arranged:  RN, PT, OT HH Agency:  [facility]  Status of Service:  Completed, signed off  If discussed at [place] of Stay Meetings, dates discussed:    Additional Comments:  Zenon Mayo, RN 05/18/2017, 4:40 PM

## 2017-05-19 NOTE — Progress Notes (Signed)
[facility]  Subjective  - right foot numbness aching fairly sudden onset about 1-2 hours ago   Objective (!) 177/51 95 100 F (37.8 C) (Oral) 18 96%  Intake/Output Summary (Last 24 hours) at 05/19/17 2049 Last data filed at 05/19/17 1500  Gross per 24 hour  Intake              480 ml  Output              580 ml  Net             -100 ml   Right foot pale waxy appearance, no DP/PT/peroneal doppler No groin or calf hematoma  Assessment/Planning: Reocclusion of right leg bypass 48 hr post revasc two times in four months, three times since original bypass in 2/17.  Pt currently with pain controlled with morphine  She will need right BKA.  She prefers to wait until Friday when either Dr Early or I am available.  Ruta Hinds 05/19/2017 8:49 PM --  Laboratory Lab Results:  Recent Labs  05/17/17 1845 05/18/17 0346  WBC 30.5* 23.1*  HGB 10.3* 8.5*  HCT 36.4 30.0*  PLT 399 341   BMET  Recent Labs  05/17/17 1845 05/18/17 0346  NA 139 139  K 4.8 4.9  CL 109 111  CO2 22 20*  GLUCOSE 141* 100*  BUN 16 17  CREATININE 0.91 1.02*  CALCIUM 7.8* 7.4*    COAG Lab Results  Component Value Date   INR 1.19 05/17/2017   INR 1.07 01/09/2017   INR 1.09 01/31/2016   No results found for: PTT

## 2017-05-19 NOTE — Progress Notes (Signed)
PT Cancellation Note  Patient Details Name: [name] MRN: [id number] DOB: 1951-05-07   Cancelled Treatment:    Reason Eval/Treat Not Completed: Pain limiting ability to participate. Pt reports she is can't amb at this time due to pain. Will re-attempt as time allows.   Beec[place] 05/19/2017, 9:09 AM Suanne Marker PT [phone]

## 2017-05-19 NOTE — Progress Notes (Addendum)
  [facility] Progress Note  Subjective  - POD #2  Incisions are uncomfortable. Right foot still painful but better than pre-op.   Objective Vitals:   05/18/17 2308 05/19/17 0453  BP: (!) 148/50 (!) 145/41  Pulse: 96 93  Resp:  18  Temp: 98.9 F (37.2 C) 99.8 F (37.7 C)    Intake/Output Summary (Last 24 hours) at 05/19/17 0731 Last data filed at 05/19/17 0453  Gross per 24 hour  Intake             2835 ml  Output             1600 ml  Net             1235 ml   Incisions intact. No hematomas seen.  Brisk right PT signal.  Right thigh JP drain almost full with serosanguinous drainage. Right calf JP with moderate serosanguinous drainage in bulb.   Assessment/Planning: 66 y.o. female is s/p: Right femoral to tibioperoneal trunk bypass with composite Gore-Tex graft and great saphenous vein from the left thigh 2 Days Post-Op   Good doppler signal right PT.  Still with moderately high output from JP drains. Will keep today.  Will ask Dr. Donnetta Hutching about Eliquis. Continue to mobilize.   Alvia Grove 05/19/2017 7:31 AM --  Laboratory CBC    Component Value Date/Time   WBC 23.1 (H) 05/18/2017 0346   HGB 8.5 (L) 05/18/2017 0346   HCT 30.0 (L) 05/18/2017 0346   HCT 30.6 (L) 03/09/2017 1200   PLT 341 05/18/2017 0346   PLT 429 (H) 03/09/2017 1200    BMET    Component Value Date/Time   NA 139 05/18/2017 0346   K 4.9 05/18/2017 0346   CL 111 05/18/2017 0346   CO2 20 (L) 05/18/2017 0346   GLUCOSE 100 (H) 05/18/2017 0346   BUN 17 05/18/2017 0346   CREATININE 1.02 (H) 05/18/2017 0346   CREATININE 1.33 (H) 03/03/2016 1531   CALCIUM 7.4 (L) 05/18/2017 0346   GFRNONAA 56 (L) 05/18/2017 0346   GFRAA >60 05/18/2017 0346    COAG Lab Results  Component Value Date   INR 1.19 05/17/2017   INR 1.07 01/09/2017   INR 1.09 01/31/2016   No results found for: PTT  Antibiotics Anti-infectives    Start     Dose/Rate Route Frequency Ordered Stop   05/17/17 1900  vancomycin (VANCOCIN) IVPB 1000 mg/200 mL premix     1,000 mg 200 mL/hr over 60 Minutes Intravenous Every 12 hours 05/17/17 1721 05/18/17 0719       Virgina Jock, PA-C [facility] Office: [phone] Pager: [phone] 05/19/2017 7:31 AM  I have examined the patient, reviewed and agree with above.Still with moderate JP drainage. Right foot well-perfused. Can begin Eliquis tomorrow.  Curt Jews, MD 05/19/2017 9:26 AM

## 2017-05-19 NOTE — Progress Notes (Addendum)
Pt having c/o of numbness in right foot as well as pain in right leg. Unable to doppler right foot pulses. MD Fields notified. Will continue to monitor. Isac Caddy, RN

## 2017-05-19 NOTE — Progress Notes (Signed)
Occupational Therapy Treatment Patient Details Name: [name] MRN: [id number] DOB: [birth year] Today's Date: 05/19/2017    History of present illness 66 yo female s/p R femoral to tibioperoneal trunk bypass with composite gore-tex graft and great saphenous vein from the left thigh. PMH: arthritis, CAD, GERD, glaucoma, HTN, NSTEMI, PVD, CVA   OT comments  Pt performed toileting and standing grooming with supervision and use of RW. Educated pt in compensatory strategies and use of AE for LB ADL. Pt verbalizing understanding.   Follow Up Recommendations  No OT follow up    Equipment Recommendations  None recommended by OT    Recommendations for Other Services      Precautions / Restrictions Precautions Precautions: Fall Restrictions Weight Bearing Restrictions: No       Mobility Bed Mobility Overal bed mobility: Modified Independent;Needs Assistance Bed Mobility: Supine to Sit;Sit to Supine     Supine to sit: Modified independent (Device/Increase time) Sit to supine: Min assist   General bed mobility comments: assisted R LE back into bed  Transfers Overall transfer level: Needs assistance Equipment used: Rolling walker (2 wheeled) Transfers: Sit to/from Stand Sit to Stand: Supervision         General transfer comment: good technique, no physical assist from bed or 3 in 1    Balance Overall balance assessment: Needs assistance   Sitting balance-Leahy Scale: Good       Standing balance-Leahy Scale: Poor Standing balance comment: relies on RW                           ADL either performed or assessed with clinical judgement   ADL Overall ADL's : Needs assistance/impaired     Grooming: Wash/dry hands;Standing;Supervision/safety         Lower Body Bathing Details (indicate cue type and reason): recommended long bath sponge       Lower Body Dressing Details (indicate cue type and reason): educated in use of her reacher and compensatory  strategies for LB dressing, pt usually avoids socks or pulls her leg up onto the bed, pt not interested in sock aide Toilet Transfer: Supervision/safety;RW;BSC;Ambulation   Toileting- Clothing Manipulation and Hygiene: Supervision/safety;Sit to/from stand       Functional mobility during ADLs: Supervision/safety;Rolling walker General ADL Comments: Pt has had this type of sx 3 times, most recently in January. She is knowledgeble in uses of 3 in 1 and has no concerns about returning home with intermittent assist of her granddaughter.     Vision       Perception     Praxis      Cognition Arousal/Alertness: Awake/alert Behavior During Therapy: WFL for tasks assessed/performed Overall Cognitive Status: Within Functional Limits for tasks assessed                                          Exercises     Shoulder Instructions       General Comments      Pertinent Vitals/ Pain       Pain Assessment: 0-10 Pain Score: 8  Pain Location: R LE Pain Descriptors / Indicators: Sore Pain Intervention(s): Monitored during session;Premedicated before session  Home Living  Prior Functioning/Environment              Frequency  Min 2X/week        Progress Toward Goals  OT Goals(current goals can now be found in the care plan section)  Progress towards OT goals: Progressing toward goals  Acute Rehab OT Goals Patient Stated Goal: To return to independent OT Goal Formulation: With patient Time For Goal Achievement: 06/01/17 Potential to Achieve Goals: Good  Plan Discharge plan needs to be updated    Co-evaluation                 AM-PAC PT "6 Clicks" Daily Activity     Outcome Measure   Help from another person eating meals?: None Help from another person taking care of personal grooming?: A Little Help from another person toileting, which includes using toliet, bedpan, or urinal?: A  Little Help from another person bathing (including washing, rinsing, drying)?: A Little Help from another person to put on and taking off regular upper body clothing?: None Help from another person to put on and taking off regular lower body clothing?: A Little 6 Click Score: 20    End of Session Equipment Utilized During Treatment: Gait belt;Rolling walker  OT Visit Diagnosis: Unsteadiness on feet (R26.81);Pain Pain - Right/Left: Right Pain - part of body: Leg   Activity Tolerance Patient tolerated treatment well   Patient Left in bed;with call bell/phone within reach   Nurse Communication          Time: 1025-1056 OT Time Calculation (min): 31 min  Charges: OT General Charges $OT Visit: 1 Procedure OT Treatments $Self Care/Home Management : 23-37 mins     Malka So 05/19/2017, 11:08 AM [phone]

## 2017-05-19 NOTE — Progress Notes (Signed)
PT Cancellation Note  Patient Details Name: [name] MRN: [id number] DOB: [birth year]   Cancelled Treatment:    Reason Eval/Treat Not Completed: Other (comment). Pt just amb with OT. Will re attempt tomorrow.   [place] 05/19/2017, 11:47 AM Suanne Marker PT [phone]

## 2017-05-20 ENCOUNTER — Encounter (HOSPITAL_COMMUNITY): Payer: Medicare Other

## 2017-05-20 MED ORDER — DIPHENHYDRAMINE HCL 12.5 MG/5ML PO ELIX: 12.5000 mg | ORAL_SOLUTION | Freq: Four times a day (QID) | ORAL | Status: DC | PRN

## 2017-05-20 MED ORDER — VANCOMYCIN HCL IN DEXTROSE 1-5 GM/200ML-% IV SOLN
1000.0000 mg | INTRAVENOUS | Status: AC
  Administered 2017-05-21: 1000 mg via INTRAVENOUS
  Filled 2017-05-20 (×2): qty 200

## 2017-05-20 MED ORDER — MORPHINE SULFATE 2 MG/ML IV SOLN
INTRAVENOUS | Status: DC
  Administered 2017-05-20: 6 mg via INTRAVENOUS
  Administered 2017-05-20: 14:00:00 via INTRAVENOUS
  Administered 2017-05-20: 6 mg via INTRAVENOUS
  Administered 2017-05-21: 7.5 mg via INTRAVENOUS
  Administered 2017-05-21 (×2): 4.5 mg via INTRAVENOUS
  Administered 2017-05-21: 6 mg via INTRAVENOUS
  Administered 2017-05-21: 20:00:00 via INTRAVENOUS
  Administered 2017-05-21: 6 mg via INTRAVENOUS
  Administered 2017-05-22: 9 mg via INTRAVENOUS
  Administered 2017-05-22: 4.5 mg via INTRAVENOUS
  Administered 2017-05-22: 6 mg via INTRAVENOUS
  Administered 2017-05-23: 04:00:00 via INTRAVENOUS
  Administered 2017-05-23: 9 mg via INTRAVENOUS
  Administered 2017-05-23: 1.5 mg via INTRAVENOUS
  Administered 2017-05-23 (×2): 4.5 mg via INTRAVENOUS
  Administered 2017-05-23: 7.5 mg via INTRAVENOUS
  Administered 2017-05-23: 6 mg via INTRAVENOUS
  Administered 2017-05-24: 3 mg via INTRAVENOUS
  Administered 2017-05-24: 4.5 mg via INTRAVENOUS
  Filled 2017-05-20 (×3): qty 30

## 2017-05-20 MED ORDER — SODIUM CHLORIDE 0.9% FLUSH: 9.0000 mL | INTRAVENOUS | Status: DC | PRN

## 2017-05-20 MED ORDER — DIPHENHYDRAMINE HCL 50 MG/ML IJ SOLN: 12.5000 mg | Freq: Four times a day (QID) | INTRAMUSCULAR | Status: DC | PRN

## 2017-05-20 MED ORDER — NALOXONE HCL 0.4 MG/ML IJ SOLN: 0.4000 mg | INTRAMUSCULAR | Status: DC | PRN

## 2017-05-20 NOTE — Progress Notes (Signed)
Subjective: Interval History: none.. Pain and cold and numbness in her right foot. Known to be occluded last night.  Objective: Vital signs in last 24 hours: Temp:  [98.4 F (36.9 C)-100 F (37.8 C)] 98.8 F (37.1 C) (05/31 0351) Pulse Rate:  [92-96] 96 (05/31 0351) Resp:  [17-18] 18 (05/31 0351) BP: (159-196)/(51-57) 196/57 (05/31 0351) SpO2:  [94 %-96 %] 94 % (05/31 0351)  Intake/Output from previous day: 05/30 0701 - 05/31 0700 In: 480 [P.O.:480] Out: 365 [Urine:300; Drains:65] Intake/Output this shift: No intake/output data recorded.  Foot: With the no motor or sensory function warm at the level of her knee. Right groin with 2+ femoral pulse.  Lab Results:  Recent Labs  05/17/17 1845 05/18/17 0346  WBC 30.5* 23.1*  HGB 10.3* 8.5*  HCT 36.4 30.0*  PLT 399 341   BMET  Recent Labs  05/17/17 1845 05/18/17 0346  NA 139 139  K 4.8 4.9  CL 109 111  CO2 22 20*  GLUCOSE 141* 100*  BUN 16 17  CREATININE 0.91 1.02*  CALCIUM 7.8* 7.4*    Studies/Results: No results found. Anti-infectives: Anti-infectives    Start     Dose/Rate Route Frequency Ordered Stop   05/17/17 1900  vancomycin (VANCOCIN) IVPB 1000 mg/200 mL premix     1,000 mg 200 mL/hr over 60 Minutes Intravenous Every 12 hours 05/17/17 1721 05/18/17 0719      Assessment/Plan: s/p Procedure(s): RIGHT FEMORAL-TIBIAL PERONEAL TRUNK ARTERY BYPASS GRAFT USING 7mmX80cm PROPATEN GRAFT WITH REMOVABLE RING (Right) LEFT LEG GREATER SAPHENOUS VEIN HARVEST (Left) Discussed with the patient. No role for further limb salvage attempts. Only outflow was diseased posterior tibial artery. Patient understands and wished to proceed with amputation. Discussed options above-knee to below-knee. She has no flow at the popliteal artery and the would have very little chance for healing a below-knee amputation. Offered below-knee with the expectation of failure versus above-knee. She does not 12 have a below-knee and once the  surgery with high chance for success. We'll plan right above-knee amputation tomorrow. Prefers waiting for Dr. Oneida Alar are myself tomorrow versus one of the partners today. Will control pain today and plan surgery tomorrow.   LOS: 3 days   Curt Jews 05/20/2017, 7:36 AM

## 2017-05-20 NOTE — Progress Notes (Signed)
PT Cancellation Note  Patient Details Name: [name] MRN: [id number] DOB: 05-Jan-1951   Cancelled Treatment:    Reason Eval/Treat Not Completed: Patient declined, no reason specified (pt with reocclusion of BPG with planned AKA tomorrow and pt declines therapy until post-op. she reports she has continued to transfer with assist)   [name] B Karem Tomaso 05/20/2017, 10:04 AM [name], [name]

## 2017-05-20 NOTE — Progress Notes (Signed)
Pt had refused PCA earlier this am, pt did not want to be hooked up to tubings and lines to be monitored while PCA in use.      Pt had IV morphine 2mg  and 100mg  ultram po this am, which has controlled her pain up until 1337.   In need of pain med at this time, no prn IV push morphine available. Pt agreeable to PCA at this time. Waiting for PCA pump and monitors to be delivered to unit.

## 2017-05-21 ENCOUNTER — Encounter (HOSPITAL_COMMUNITY): Payer: Self-pay | Admitting: *Deleted

## 2017-05-21 ENCOUNTER — Encounter (HOSPITAL_COMMUNITY)
Admission: EM | Disposition: A | Discharge status: Rehabilitation Facility | Payer: Self-pay | Source: Home / Self Care | Attending: Vascular Surgery

## 2017-05-21 ENCOUNTER — Inpatient Hospital Stay (HOSPITAL_COMMUNITY): Payer: Medicare Other | Admitting: Certified Registered Nurse Anesthetist

## 2017-05-21 DIAGNOSIS — I998 Other disorder of circulatory system: Secondary | ICD-10-CM

## 2017-05-21 HISTORY — PX: AMPUTATION: SHX166

## 2017-05-21 LAB — CBC
HEMATOCRIT: 28.7 % — AB (ref 36.0–46.0)
Hemoglobin: 7.8 g/dL — ABNORMAL LOW (ref 12.0–15.0)
MCH: 19.8 pg — ABNORMAL LOW (ref 26.0–34.0)
MCHC: 27.2 g/dL — AB (ref 30.0–36.0)
MCV: 72.8 fL — AB (ref 78.0–100.0)
Platelets: 318 10*3/uL (ref 150–400)
RBC: 3.94 MIL/uL (ref 3.87–5.11)
RDW: 21.4 % — AB (ref 11.5–15.5)
WBC: 17.5 10*3/uL — ABNORMAL HIGH (ref 4.0–10.5)

## 2017-05-21 LAB — BASIC METABOLIC PANEL
Anion gap: 10 (ref 5–15)
BUN: 8 mg/dL (ref 6–20)
CHLORIDE: 100 mmol/L — AB (ref 101–111)
CO2: 27 mmol/L (ref 22–32)
Calcium: 8.4 mg/dL — ABNORMAL LOW (ref 8.9–10.3)
Creatinine, Ser: 0.72 mg/dL (ref 0.44–1.00)
GFR calc Af Amer: >60 mL/min (ref >60)
GFR calc non Af Amer: >60 mL/min (ref >60)
GLUCOSE: 106 mg/dL — AB (ref 65–99)
Potassium: 3.9 mmol/L (ref 3.5–5.1)
Sodium: 137 mmol/L (ref 135–145)

## 2017-05-21 LAB — TYPE AND SCREEN
ABO/RH(D): A POS
ANTIBODY SCREEN: NEGATIVE
UNIT DIVISION: 0
UNIT DIVISION: 0

## 2017-05-21 LAB — PREPARE RBC (CROSSMATCH)

## 2017-05-21 LAB — BPAM RBC
Blood Product Expiration Date: 201806122359
Blood Product Expiration Date: 201806122359
Unit Type and Rh: 6200
Unit Type and Rh: 6200

## 2017-05-21 SURGERY — AMPUTATION, ABOVE KNEE
Anesthesia: General | Laterality: Right

## 2017-05-21 MED ORDER — SUGAMMADEX SODIUM 200 MG/2ML IV SOLN
INTRAVENOUS | Status: AC
  Filled 2017-05-21: qty 2

## 2017-05-21 MED ORDER — MIDAZOLAM HCL 2 MG/2ML IJ SOLN
INTRAMUSCULAR | Status: AC
  Filled 2017-05-21: qty 2

## 2017-05-21 MED ORDER — KETAMINE HCL 10 MG/ML IJ SOLN
INTRAMUSCULAR | Status: DC | PRN
  Administered 2017-05-21: 10 mg via INTRAVENOUS
  Administered 2017-05-21: 20 mg via INTRAVENOUS

## 2017-05-21 MED ORDER — ROCURONIUM BROMIDE 100 MG/10ML IV SOLN
INTRAVENOUS | Status: DC | PRN
  Administered 2017-05-21: 20 mg via INTRAVENOUS
  Administered 2017-05-21: 40 mg via INTRAVENOUS

## 2017-05-21 MED ORDER — FENTANYL CITRATE (PF) 250 MCG/5ML IJ SOLN
INTRAMUSCULAR | Status: AC
  Filled 2017-05-21: qty 5

## 2017-05-21 MED ORDER — PROPOFOL 10 MG/ML IV BOLUS
INTRAVENOUS | Status: AC
  Filled 2017-05-21: qty 20

## 2017-05-21 MED ORDER — MIDAZOLAM HCL 5 MG/5ML IJ SOLN
INTRAMUSCULAR | Status: DC | PRN
  Administered 2017-05-21: 1 mg via INTRAVENOUS

## 2017-05-21 MED ORDER — HYDRALAZINE HCL 20 MG/ML IJ SOLN
INTRAMUSCULAR | Status: AC
Start: 2017-05-21 — End: 2017-05-22
  Filled 2017-05-21: qty 1

## 2017-05-21 MED ORDER — PHENYLEPHRINE 40 MCG/ML (10ML) SYRINGE FOR IV PUSH (FOR BLOOD PRESSURE SUPPORT)
PREFILLED_SYRINGE | INTRAVENOUS | Status: AC
  Filled 2017-05-21: qty 10

## 2017-05-21 MED ORDER — HYDROMORPHONE HCL 1 MG/ML IJ SOLN
INTRAMUSCULAR | Status: AC
  Filled 2017-05-21: qty 0.5

## 2017-05-21 MED ORDER — PROPOFOL 10 MG/ML IV BOLUS
INTRAVENOUS | Status: DC | PRN
  Administered 2017-05-21: 100 mg via INTRAVENOUS

## 2017-05-21 MED ORDER — LIDOCAINE HCL (CARDIAC) 20 MG/ML IV SOLN
INTRAVENOUS | Status: DC | PRN
  Administered 2017-05-21: 80 mg via INTRAVENOUS

## 2017-05-21 MED ORDER — PROMETHAZINE HCL 25 MG/ML IJ SOLN: 6.2500 mg | INTRAMUSCULAR | Status: DC | PRN

## 2017-05-21 MED ORDER — FENTANYL CITRATE (PF) 100 MCG/2ML IJ SOLN
INTRAMUSCULAR | Status: DC | PRN
  Administered 2017-05-21 (×4): 50 ug via INTRAVENOUS

## 2017-05-21 MED ORDER — 0.9 % SODIUM CHLORIDE (POUR BTL) OPTIME
TOPICAL | Status: DC | PRN
  Administered 2017-05-21: 1000 mL

## 2017-05-21 MED ORDER — SODIUM CHLORIDE 0.9 % IV SOLN
Freq: Once | INTRAVENOUS | Status: AC
  Administered 2017-05-21: 19:00:00 via INTRAVENOUS

## 2017-05-21 MED ORDER — SUGAMMADEX SODIUM 200 MG/2ML IV SOLN
INTRAVENOUS | Status: DC | PRN
  Administered 2017-05-21: 200 mg via INTRAVENOUS

## 2017-05-21 MED ORDER — PHENYLEPHRINE HCL 10 MG/ML IJ SOLN
INTRAMUSCULAR | Status: DC | PRN
  Administered 2017-05-21: 20 ug/min via INTRAVENOUS

## 2017-05-21 MED ORDER — HYDROMORPHONE HCL 1 MG/ML IJ SOLN
0.2500 mg | INTRAMUSCULAR | Status: DC | PRN
  Administered 2017-05-21: 0.5 mg via INTRAVENOUS

## 2017-05-21 MED ORDER — EPHEDRINE SULFATE 50 MG/ML IJ SOLN
INTRAMUSCULAR | Status: DC | PRN
  Administered 2017-05-21 (×3): 5 mg via INTRAVENOUS

## 2017-05-21 MED ORDER — LACTATED RINGERS IV SOLN
INTRAVENOUS | Status: DC
Start: 2017-05-21 — End: 2017-05-21
  Administered 2017-05-21: 16:00:00 via INTRAVENOUS

## 2017-05-21 MED ORDER — KETAMINE HCL-SODIUM CHLORIDE 100-0.9 MG/10ML-% IV SOSY
PREFILLED_SYRINGE | INTRAVENOUS | Status: AC
  Filled 2017-05-21: qty 10

## 2017-05-21 MED ORDER — ONDANSETRON HCL 4 MG/2ML IJ SOLN
INTRAMUSCULAR | Status: AC
  Filled 2017-05-21: qty 2

## 2017-05-21 MED ORDER — HYDRALAZINE HCL 20 MG/ML IJ SOLN
5.0000 mg | INTRAMUSCULAR | Status: DC | PRN
  Administered 2017-05-21: 5 mg via INTRAVENOUS

## 2017-05-21 MED ORDER — OXYCODONE HCL 5 MG/5ML PO SOLN: 5.0000 mg | Freq: Once | ORAL | Status: DC | PRN

## 2017-05-21 MED ORDER — OXYCODONE HCL 5 MG PO TABS: 5.0000 mg | ORAL_TABLET | Freq: Once | ORAL | Status: DC | PRN

## 2017-05-21 SURGICAL SUPPLY — 51 items
BANDAGE ACE 4X5 VEL STRL LF (GAUZE/BANDAGES/DRESSINGS) ×1 IMPLANT
BANDAGE ACE 6X5 VEL STRL LF (GAUZE/BANDAGES/DRESSINGS) ×3 IMPLANT
BANDAGE ELASTIC 6 VELCRO ST LF (GAUZE/BANDAGES/DRESSINGS) ×2 IMPLANT
BANDAGE ESMARK 6X9 LF (GAUZE/BANDAGES/DRESSINGS) IMPLANT
BLADE SAW GIGLI 510 (BLADE) ×2 IMPLANT
BLADE SAW GIGLI 510MM (BLADE) ×1
BNDG CMPR 9X6 STRL LF SNTH (GAUZE/BANDAGES/DRESSINGS)
BNDG ESMARK 6X9 LF (GAUZE/BANDAGES/DRESSINGS)
BNDG GAUZE ELAST 4 BULKY (GAUZE/BANDAGES/DRESSINGS) ×4 IMPLANT
CANISTER SUCT 3000ML PPV (MISCELLANEOUS) ×3 IMPLANT
CLIP LIGATING EXTRA MED SLVR (CLIP) ×3 IMPLANT
CLIP LIGATING EXTRA SM BLUE (MISCELLANEOUS) ×3 IMPLANT
COVER SURGICAL LIGHT HANDLE (MISCELLANEOUS) ×6 IMPLANT
CUFF TOURNIQUET SINGLE 34IN LL (TOURNIQUET CUFF) IMPLANT
CUFF TOURNIQUET SINGLE 44IN (TOURNIQUET CUFF) IMPLANT
DRAIN SNY 10X20 3/4 PERF (WOUND CARE) IMPLANT
DRAPE HALF SHEET 40X57 (DRAPES) ×3 IMPLANT
DRAPE ORTHO SPLIT 77X108 STRL (DRAPES) ×6
DRAPE SURG ORHT 6 SPLT 77X108 (DRAPES) ×2 IMPLANT
DRSG ADAPTIC 3X8 NADH LF (GAUZE/BANDAGES/DRESSINGS) ×2 IMPLANT
DRSG PAD ABDOMINAL 8X10 ST (GAUZE/BANDAGES/DRESSINGS) ×2 IMPLANT
ELECT REM PT RETURN 9FT ADLT (ELECTROSURGICAL) ×3
ELECTRODE REM PT RTRN 9FT ADLT (ELECTROSURGICAL) ×1 IMPLANT
EVACUATOR SILICONE 100CC (DRAIN) IMPLANT
GAUZE SPONGE 4X4 12PLY STRL (GAUZE/BANDAGES/DRESSINGS) ×4 IMPLANT
GAUZE XEROFORM 5X9 LF (GAUZE/BANDAGES/DRESSINGS) ×3 IMPLANT
GLOVE BIOGEL M 6.5 STRL (GLOVE) ×6 IMPLANT
GLOVE SS BIOGEL STRL SZ 7.5 (GLOVE) ×1 IMPLANT
GLOVE SUPERSENSE BIOGEL SZ 7.5 (GLOVE) ×2
GOWN STRL REUS W/ TWL LRG LVL3 (GOWN DISPOSABLE) ×3 IMPLANT
GOWN STRL REUS W/TWL LRG LVL3 (GOWN DISPOSABLE) ×12
KIT BASIN OR (CUSTOM PROCEDURE TRAY) ×3 IMPLANT
KIT ROOM TURNOVER OR (KITS) ×3 IMPLANT
NS IRRIG 1000ML POUR BTL (IV SOLUTION) ×3 IMPLANT
PACK GENERAL/GYN (CUSTOM PROCEDURE TRAY) ×3 IMPLANT
PAD ARMBOARD 7.5X6 YLW CONV (MISCELLANEOUS) ×6 IMPLANT
PADDING CAST COTTON 6X4 STRL (CAST SUPPLIES) IMPLANT
STAPLER VISISTAT 35W (STAPLE) ×3 IMPLANT
STOCKINETTE IMPERVIOUS LG (DRAPES) ×3 IMPLANT
SUT ETHILON 3 0 PS 1 (SUTURE) IMPLANT
SUT VIC AB 0 CT1 18XCR BRD 8 (SUTURE) ×2 IMPLANT
SUT VIC AB 0 CT1 8-18 (SUTURE)
SUT VIC AB 2-0 CT1 18 (SUTURE) ×4 IMPLANT
SUT VIC AB 3-0 SH 27 (SUTURE) ×3
SUT VIC AB 3-0 SH 27X BRD (SUTURE) IMPLANT
SUT VICRYL AB 2 0 TIES (SUTURE) ×3 IMPLANT
TOWEL GREEN STERILE (TOWEL DISPOSABLE) ×2 IMPLANT
TOWEL OR 17X24 6PK STRL BLUE (TOWEL DISPOSABLE) ×1 IMPLANT
TOWEL OR 17X26 10 PK STRL BLUE (TOWEL DISPOSABLE) ×1 IMPLANT
UNDERPAD 30X30 (UNDERPADS AND DIAPERS) ×3 IMPLANT
WATER STERILE IRR 1000ML POUR (IV SOLUTION) ×1 IMPLANT

## 2017-05-21 NOTE — Anesthesia Procedure Notes (Signed)
Procedure Name: Intubation Date/Time: 05/21/2017 4:23 PM Performed by: Rejeana Brock L Pre-anesthesia Checklist: Patient identified, Emergency Drugs available, Suction available and Patient being monitored Patient Re-evaluated:Patient Re-evaluated prior to inductionOxygen Delivery Method: Circle System Utilized Preoxygenation: Pre-oxygenation with 100% oxygen Intubation Type: IV induction Ventilation: Mask ventilation without difficulty Laryngoscope Size: Mac and 3 Grade View: Grade I Tube type: Oral Tube size: 7.0 mm Number of attempts: 1 Airway Equipment and Method: Stylet and Oral airway Placement Confirmation: ETT inserted through vocal cords under direct vision,  positive ETCO2 and breath sounds checked- equal and bilateral Secured at: 20 cm Tube secured with: Tape Dental Injury: Teeth and Oropharynx as per pre-operative assessment

## 2017-05-21 NOTE — Addendum Note (Signed)
Addendum  created 05/21/17 0913 by Effie Berkshire, MD   Sign clinical note

## 2017-05-21 NOTE — Interval H&P Note (Signed)
History and Physical Interval Note:  05/21/2017 3:55 PM  [name]  has presented today for surgery, with the diagnosis of Wound Right Leg  The various methods of treatment have been discussed with the patient and family. After consideration of risks, benefits and other options for treatment, the patient has consented to  Procedure(s): AMPUTATION ABOVE KNEE (Right) as a surgical intervention .  The patient's history has been reviewed, patient examined, no change in status, stable for surgery.  I have reviewed the patient's chart and labs.  Questions were answered to the patient's satisfaction.     Curt Jews

## 2017-05-21 NOTE — Care Management Important Message (Signed)
Important Message  Patient Details  Name: [name] MRN: [id number] Date of Birth: 03-Jan-1951   Medicare Important Message Given:  Yes    Nathen May 05/21/2017, 12:57 PM

## 2017-05-21 NOTE — Progress Notes (Signed)
Patient scheduled for right leg amputation. This morning's hemoglobin and hematocrit 7.8 and 28.7. Paged Dr. Donnetta Hutching, received verbal order to type and cross and order two units packed red blood cells to be put on hold for future use if needed during surgery.

## 2017-05-21 NOTE — Op Note (Signed)
    OPERATIVE REPORT  DATE OF SURGERY: 05/21/2017  PATIENT: [name], 66 y.o. female MRN: [id number]  DOB: [birth year]  PRE-OPERATIVE DIAGNOSIS: Unreconstructable ischemia right foot  POST-OPERATIVE DIAGNOSIS:  Same  PROCEDURE: Right above-knee amputation  SURGEON:  Curt Jews, M.D.  PHYSICIAN ASSISTANT: Nurse  ANESTHESIA:  Gen.  EBL: 100 ml  Total I/O In: 9449 [I.V.:700; Blood:335; Other:35] Out: 100 [Blood:100]  BLOOD ADMINISTERED: 1 unit packed cells  DRAINS: None  SPECIMEN: Amputation  COUNTS CORRECT:  YES  PLAN OF CARE: PACU stable   PATIENT DISPOSITION:  PACU - hemodynamically stable  PROCEDURE DETAILS: The patient was taken to the operating placed supine position where the area of the right leg prepped in sterile fashion. A fishmouth type incision was made just above the knee and carried down through the fascia and muscle with electrocautery. The muscle was all viable. The patient had prior placed Gore-Tex graft and this was ligated proximal to the skin incision and divided this was occluded. The superficial femoral artery and femoral vein were individually ligated and divided. The sciatic nerve was ligated proximally and it was divided as well. The periosteum was elevated off the femur and the femur was divided several centimeters above the skin incision to prevent tension on the skin incision. This was done with a Gigli saw. The specimen was passed off the field. The edges of the femur were smoothed with a bone rasp. The wound was irrigated with saline and hemostasis tablet cautery. The anterior fascia was closed and posterior fascia with interrupted 0 Vicryl figure-of-eight sutures. The skin was closed with skin staples. Sterile dressing was applied and the patient was transferred to the recovery room in stable condition   Rosetta Posner, M.D., [facility] 05/21/2017 6:07 PM

## 2017-05-21 NOTE — Anesthesia Preprocedure Evaluation (Addendum)
Anesthesia Evaluation  Patient identified by MRN, date of birth, ID band Patient awake    Reviewed: Allergy & Precautions, NPO status , Patient's Chart, lab work & pertinent test results, reviewed documented beta blocker date and time   History of Anesthesia Complications Negative for: history of anesthetic complications  Airway Mallampati: I  TM Distance: >3 FB Neck ROM: Full    Dental  (+) Edentulous Upper, Edentulous Lower, Dental Advisory Given   Pulmonary former smoker,    Pulmonary exam normal breath sounds clear to auscultation       Cardiovascular hypertension, Pt. on home beta blockers and Pt. on medications + CAD and + Peripheral Vascular Disease  + dysrhythmias Atrial Fibrillation + Valvular Problems/Murmurs MR  Rhythm:Regular Rate:Normal + Systolic murmurs SR, rate 69 Sees cardiologist [name])   Neuro/Psych  Headaches, CVA, No Residual Symptoms negative psych ROS   GI/Hepatic Neg liver ROS, GERD  Medicated and Controlled,  Endo/Other  negative endocrine ROS  Renal/GU negative Renal ROS  negative genitourinary   Musculoskeletal  (+) Arthritis , Osteoarthritis,    Abdominal   Peds  Hematology  (+) Blood dyscrasia (eliquis), anemia , Polycythemia   Anesthesia Other Findings Hyperlipidemia  Reproductive/Obstetrics                            Lab Results  Component Value Date   WBC 17.5 (H) 05/21/2017   HGB 7.8 (L) 05/21/2017   HCT 28.7 (L) 05/21/2017   MCV 72.8 (L) 05/21/2017   PLT 318 05/21/2017   Lab Results  Component Value Date   CREATININE 0.72 05/21/2017   BUN 8 05/21/2017   NA 137 05/21/2017   K 3.9 05/21/2017   CL 100 (L) 05/21/2017   CO2 27 05/21/2017   Lab Results  Component Value Date   INR 1.19 05/17/2017   INR 1.07 01/09/2017   INR 1.09 01/31/2016   EKG: normal sinus rhythm.  Anesthesia Physical  Anesthesia Plan  ASA: III and emergent  Anesthesia  Plan: General   Post-op Pain Management:    Induction: Intravenous  Airway Management Planned: Oral ETT  Additional Equipment:   Intra-op Plan:   Post-operative Plan: Extubation in OR  Informed Consent: I have reviewed the patients History and Physical, chart, labs and discussed the procedure including the risks, benefits and alternatives for the proposed anesthesia with the patient or authorized representative who has indicated his/her understanding and acceptance.     Plan Discussed with: CRNA and Surgeon  Anesthesia Plan Comments:         Anesthesia Quick Evaluation

## 2017-05-21 NOTE — Transfer of Care (Signed)
Immediate Anesthesia Transfer of Care Note  Patient: [name]  Procedure(s) Performed: Procedure(s): AMPUTATION ABOVE KNEE (Right)  Patient Location: PACU  Anesthesia Type:General  Level of Consciousness: awake  Airway & Oxygen Therapy: Patient Spontanous Breathing and Patient connected to nasal cannula oxygen  Post-op Assessment: Report given to RN, Post -op Vital signs reviewed and stable and Patient moving all extremities X 4  Post vital signs: Reviewed and stable  Last Vitals:  Vitals:   05/21/17 1300 05/21/17 1742  BP: (!) 160/65 (!) 177/57  Pulse: 75 95  Resp: 17 11  Temp: 36.6 C 37.2 C    Last Pain:  Vitals:   05/21/17 1742  TempSrc:   PainSc: 0-No pain      Patients Stated Pain Goal: 4 (94/85/46 2703)  Complications: No apparent anesthesia complications

## 2017-05-21 NOTE — H&P (View-Only) (Signed)
Subjective: Interval History: none.. Pain and cold and numbness in her right foot. Known to be occluded last night.  Objective: Vital signs in last 24 hours: Temp:  [98.4 F (36.9 C)-100 F (37.8 C)] 98.8 F (37.1 C) (05/31 0351) Pulse Rate:  [92-96] 96 (05/31 0351) Resp:  [17-18] 18 (05/31 0351) BP: (159-196)/(51-57) 196/57 (05/31 0351) SpO2:  [94 %-96 %] 94 % (05/31 0351)  Intake/Output from previous day: 05/30 0701 - 05/31 0700 In: 480 [P.O.:480] Out: 365 [Urine:300; Drains:65] Intake/Output this shift: No intake/output data recorded.  Foot: With the no motor or sensory function warm at the level of her knee. Right groin with 2+ femoral pulse.  Lab Results:  Recent Labs  05/17/17 1845 05/18/17 0346  WBC 30.5* 23.1*  HGB 10.3* 8.5*  HCT 36.4 30.0*  PLT 399 341   BMET  Recent Labs  05/17/17 1845 05/18/17 0346  NA 139 139  K 4.8 4.9  CL 109 111  CO2 22 20*  GLUCOSE 141* 100*  BUN 16 17  CREATININE 0.91 1.02*  CALCIUM 7.8* 7.4*    Studies/Results: No results found. Anti-infectives: Anti-infectives    Start     Dose/Rate Route Frequency Ordered Stop   05/17/17 1900  vancomycin (VANCOCIN) IVPB 1000 mg/200 mL premix     1,000 mg 200 mL/hr over 60 Minutes Intravenous Every 12 hours 05/17/17 1721 05/18/17 0719      Assessment/Plan: s/p Procedure(s): RIGHT FEMORAL-TIBIAL PERONEAL TRUNK ARTERY BYPASS GRAFT USING 84mmX80cm PROPATEN GRAFT WITH REMOVABLE RING (Right) LEFT LEG GREATER SAPHENOUS VEIN HARVEST (Left) Discussed with the patient. No role for further limb salvage attempts. Only outflow was diseased posterior tibial artery. Patient understands and wished to proceed with amputation. Discussed options above-knee to below-knee. She has no flow at the popliteal artery and the would have very little chance for healing a below-knee amputation. Offered below-knee with the expectation of failure versus above-knee. She does not 12 have a below-knee and once the  surgery with high chance for success. We'll plan right above-knee amputation tomorrow. Prefers waiting for Dr. Oneida Alar are myself tomorrow versus one of the partners today. Will control pain today and plan surgery tomorrow.   LOS: 3 days   Curt Jews 05/20/2017, 7:36 AM

## 2017-05-22 NOTE — Anesthesia Postprocedure Evaluation (Signed)
Anesthesia Post Note  Patient: [name]  Procedure(s) Performed: Procedure(s) (LRB): AMPUTATION ABOVE KNEE (Right)     Patient location during evaluation: PACU Anesthesia Type: General Level of consciousness: awake and alert Pain management: pain level controlled Vital Signs Assessment: post-procedure vital signs reviewed and stable Respiratory status: spontaneous breathing, nonlabored ventilation, respiratory function stable and patient connected to nasal cannula oxygen Cardiovascular status: blood pressure returned to baseline and stable Postop Assessment: no signs of nausea or vomiting Anesthetic complications: no    Last Vitals:  Vitals:   05/22/17 0453 05/22/17 0700  BP: (!) 165/58   Pulse: 76   Resp: 18 16  Temp: 36.7 C     Last Pain:  Vitals:   05/22/17 0936  TempSrc:   PainSc: [place]

## 2017-05-22 NOTE — Evaluation (Signed)
Physical Therapy Re-Evaluation Patient Details Name: [name] NAME MRN: [id number] DOB: Feb 15, 1951 Today's Date: 05/22/2017   History of Present Illness  66 yo female s/p R femoral to tibioperoneal trunk bypass with composite gore-tex graft and great saphenous vein from the left thigh.  Now, patient s/p Rt AKA on 05/21/17.    PMH: arthritis, CAD, GERD, glaucoma, HTN, NSTEMI, PVD, CVA  Clinical Impression  Patient is now s/p Rt AKA.  Able to transfer bed <> BSC with mod assist.  PTA patient was independent with mobility/gait.  Patient will need to improve to Mod I level with RW or w/c to return home safely.  Recommend Inpatient Rehab consult for comprehensive therapy with goal to return home.    Follow Up Recommendations CIR;Supervision for mobility/OOB    Equipment Recommendations  Wheelchair (measurements PT);Wheelchair cushion (measurements PT)    Recommendations for Other Services Rehab consult     Precautions / Restrictions Precautions Precautions: Fall Precaution Comments: watch BP Restrictions Weight Bearing Restrictions: No (No orders) RLE Weight Bearing: Non weight bearing (New Rt AKA)      Mobility  Bed Mobility Overal bed mobility: Needs Assistance Bed Mobility: Supine to Sit;Sit to Supine     Supine to sit: Min assist;HOB elevated Sit to supine: Min assist;HOB elevated   General bed mobility comments: Patient required increased time, stopping frequently due to RLE pain.  Min assist to steady during transitions.  Transfers Overall transfer level: Needs assistance Equipment used: Rolling walker (2 wheeled) Transfers: Sit to/from [facility] Sit to Stand: Mod assist Stand pivot transfers: Min assist       General transfer comment: Verbal cues for hand placement.  Mod assist to power up onto LLE.  Min assist for balance in stance.  Cues to let RLE rest downward in neutral position.  Patient able to take several hop steps with RW and min-mod assist  to transfer bed <> BSC.  Ambulation/Gait             General Gait Details: Patient declined today due to pain.  Stairs            Wheelchair Mobility    Modified Rankin (Stroke Patients Only)       Balance Overall balance assessment: Needs assistance Sitting-balance support: No upper extremity supported;Feet supported Sitting balance-Leahy Scale: Fair     Standing balance support: Bilateral upper extremity supported Standing balance-Leahy Scale: Poor                               Pertinent Vitals/Pain Pain Assessment: 0-10 Pain Score: 7  Pain Location: R LE Pain Descriptors / Indicators: Sore Pain Intervention(s): Limited activity within patient's tolerance;Monitored during session;Repositioned;PCA encouraged    Home Living Family/patient expects to be discharged to:: Private residence Living Arrangements: Other relatives Available Help at Discharge: Family;Friend(s);Available PRN/intermittently Type of Home: House Home Access: Stairs to enter Entrance Stairs-Rails: Psychiatric nurse of Steps: 4 Home Layout: One level Home Equipment: Walker - 2 wheels;Bedside commode;Shower seat;Grab bars - tub/shower;Hand held shower head;Adaptive equipment Additional Comments: pt uses BSC in tub shower and states she can sit on BSC and then swing legs into tub from there.     Prior Function Level of Independence: Independent      ADL's / Homemaking Assistance Needed: has a neighbor that is a CNA that can (A) if she needs to call her. Her name is "wanda"  Comments: has 2 grandchildren that  can help her after they get out of school     Hand Dominance   Dominant Hand: Right    Extremity/Trunk Assessment   Upper Extremity Assessment Upper Extremity Assessment: Defer to OT evaluation    Lower Extremity Assessment Lower Extremity Assessment: RLE deficits/detail RLE Deficits / Details: New AKA.  Able to flex hip against gravity.   Patient reports she still feels her Rt foot. RLE: Unable to fully assess due to pain    Cervical / Trunk Assessment Cervical / Trunk Assessment: Normal  Communication   Communication: No difficulties  Cognition Arousal/Alertness: Awake/alert Behavior During Therapy: WFL for tasks assessed/performed;Anxious Overall Cognitive Status: Within Functional Limits for tasks assessed                                        General Comments      Exercises     Assessment/Plan    PT Assessment Patient needs continued PT services  PT Problem List Decreased activity tolerance;Decreased knowledge of use of DME;Pain;Decreased range of motion;Decreased mobility       PT Treatment Interventions DME instruction;Gait training;Functional mobility training;Therapeutic activities;Therapeutic exercise;Balance training;Patient/family education    PT Goals (Current goals can be found in the Care Plan section)  Acute Rehab PT Goals Patient Stated Goal: To be able to return home. PT Goal Formulation: With patient Time For Goal Achievement: 05/29/17 Potential to Achieve Goals: Good    Frequency Min 3X/week   Barriers to discharge Inaccessible home environment;Decreased caregiver support Stairs to enter home.  PRN assist.    Co-evaluation               AM-PAC PT "6 Clicks" Daily Activity  Outcome Measure Difficulty turning over in bed (including adjusting bedclothes, sheets and blankets)?: None Difficulty moving from lying on back to sitting on the side of the bed? : A Little Difficulty sitting down on and standing up from a chair with arms (e.g., wheelchair, bedside commode, etc,.)?: Total Help needed moving to and from a bed to chair (including a wheelchair)?: A Little Help needed walking in hospital room?: A Lot Help needed climbing 3-5 steps with a railing? : A Lot 6 Click Score: 15    End of Session Equipment Utilized During Treatment: Gait belt;Oxygen Activity  Tolerance: Patient limited by pain Patient left: in bed;with call bell/phone within reach;with bed alarm set Nurse Communication: Mobility status PT Visit Diagnosis: Difficulty in walking, not elsewhere classified (R26.2);Pain Pain - Right/Left: Right Pain - part of body: Leg    Time: 1349-1420 PT Time Calculation (min) (ACUTE ONLY): 31 min   Charges:   PT Evaluation $PT Re-evaluation: 1 Procedure PT Treatments $Therapeutic Activity: 8-22 mins   PT G Codes:        Carita Pian. Sanjuana Kava, [facility] Acute Rehab Services Pager [phone]   Despina Pole 05/22/2017, 2:47 PM

## 2017-05-22 NOTE — Progress Notes (Addendum)
  [facility] Progress Note  Subjective  - POD #1  Pain with right AKA. Feels different than pre-op.   Objective Vitals:   05/22/17 0453 05/22/17 0700  BP: (!) 165/58   Pulse: 76   Resp: 18 16  Temp: 98.1 F (36.7 C)     Intake/Output Summary (Last 24 hours) at 05/22/17 1006 Last data filed at 05/22/17 0900  Gross per 24 hour  Intake           1095.5 ml  Output             1100 ml  Net             -4.5 ml   Right groin incision clean and dry.  Right AKA dressing clean.   Assessment/Planning: 66 y.o. female is s/p: right AKA, failed right femoral-tpt trunk bypass  05/17/17  1 Day Post-Op   Will take down dressing tomorrow. PT and OT eval.  Keep PCA today.   Alvia Grove 05/22/2017 10:06 AM --  Agree with above Acute blood loss anemia will watch for now asymptomatic Change dressing am Hopefully rehab next week  Ruta Hinds, MD [facility] [place]: [phone] Pager: [phone]  Laboratory CBC    Component Value Date/Time   WBC 17.5 (H) 05/21/2017 0419   HGB 7.8 (L) 05/21/2017 0419   HCT 28.7 (L) 05/21/2017 0419   HCT 30.6 (L) 03/09/2017 1200   PLT 318 05/21/2017 0419   PLT 429 (H) 03/09/2017 1200    BMET    Component Value Date/Time   NA 137 05/21/2017 0419   K 3.9 05/21/2017 0419   CL 100 (L) 05/21/2017 0419   CO2 27 05/21/2017 0419   GLUCOSE 106 (H) 05/21/2017 0419   BUN 8 05/21/2017 0419   CREATININE 0.72 05/21/2017 0419   CREATININE 1.33 (H) 03/03/2016 1531   CALCIUM 8.4 (L) 05/21/2017 0419   GFRNONAA >60 05/21/2017 0419   GFRAA >60 05/21/2017 0419    COAG Lab Results  Component Value Date   INR 1.19 05/17/2017   INR 1.07 01/09/2017   INR 1.09 01/31/2016   No results found for: PTT  Antibiotics Anti-infectives    Start     Dose/Rate Route Frequency Ordered Stop   05/21/17 0600  vancomycin (VANCOCIN) IVPB 1000 mg/200 mL premix     1,000 mg 200 mL/hr over 60 Minutes  Intravenous To Surgery 05/20/17 0756 05/21/17 1710   05/17/17 1900  vancomycin (VANCOCIN) IVPB 1000 mg/200 mL premix     1,000 mg 200 mL/hr over 60 Minutes Intravenous Every 12 hours 05/17/17 1721 05/18/17 0719       Virgina Jock, PA-C [facility] Office: [phone] Pager: [phone] 05/22/2017 10:06 AM

## 2017-05-23 ENCOUNTER — Encounter (HOSPITAL_COMMUNITY): Payer: Self-pay | Admitting: Vascular Surgery

## 2017-05-23 LAB — CBC
HCT: 30.3 % — ABNORMAL LOW (ref 36.0–46.0)
Hemoglobin: 8.8 g/dL — ABNORMAL LOW (ref 12.0–15.0)
MCH: 21.8 pg — AB (ref 26.0–34.0)
MCHC: 29 g/dL — AB (ref 30.0–36.0)
MCV: 75.2 fL — AB (ref 78.0–100.0)
PLATELETS: 376 10*3/uL (ref 150–400)
RBC: 4.03 MIL/uL (ref 3.87–5.11)
RDW: 22 % — AB (ref 11.5–15.5)
WBC: 17.3 10*3/uL — ABNORMAL HIGH (ref 4.0–10.5)

## 2017-05-23 LAB — TROPONIN I
Troponin I: 0.24 ng/mL (ref <0.03)
Troponin I: 0.04 ng/mL (ref <0.03)

## 2017-05-23 MED ORDER — SIMETHICONE 80 MG PO CHEW: 80.0000 mg | CHEWABLE_TABLET | Freq: Four times a day (QID) | ORAL | Status: DC | PRN

## 2017-05-23 MED ORDER — APIXABAN 5 MG PO TABS
5.0000 mg | ORAL_TABLET | Freq: Two times a day (BID) | ORAL | Status: DC
Start: 2017-05-23 — End: 2017-05-25
  Administered 2017-05-23 – 2017-05-25 (×5): 5 mg via ORAL
  Filled 2017-05-23 (×5): qty 1

## 2017-05-23 MED ORDER — ASPIRIN EC 325 MG PO TBEC
325.0000 mg | DELAYED_RELEASE_TABLET | Freq: Every day | ORAL | Status: DC
  Administered 2017-05-23 – 2017-05-25 (×3): 325 mg via ORAL
  Filled 2017-05-23 (×3): qty 1

## 2017-05-23 NOTE — Progress Notes (Addendum)
  [facility] Progress Note  Subjective  - POD #2  Having phantom pain right leg.   Objective Vitals:   05/23/17 0605 05/23/17 0635  BP: (!) 173/55 (!) 164/71  Pulse:  80  Resp:  18  Temp:      Intake/Output Summary (Last 24 hours) at 05/23/17 0840 Last data filed at 05/23/17 9702  Gross per 24 hour  Intake                0 ml  Output             2400 ml  Net            -2400 ml   Right AKA staples intact with viable skin edges. Minimal pinpoint ooze towards medial incision.  Right groin incision with some ecchymosis but hematoma. No drainage.   Assessment/Planning: 66 y.o. female is s/p: right AKA, early occlusion right femoral-tpt trunk bypass 05/17/17 2 Days Post-Op   Dressing taken down. Right AKA healing well.  Will consult CIR.  Daily dressing changes.  D/c PCA today.   Alvia Grove 05/23/2017 8:40 AM -- Agree with above.  Pt complains of some chest pain and intermittent nausea.  Pain seems to be positional.  Most likely GI upset but will check EKG and troponin.  Pt has maalox and simethicone ordered  Acute blood loss anemia.  Since having chest pain will recheck CBC today.  If Hgb still less than 8 will transfuse.  Ruta Hinds, MD [facility] [place] Office: [phone] Pager: [phone]  Laboratory CBC    Component Value Date/Time   WBC 17.5 (H) 05/21/2017 0419   HGB 7.8 (L) 05/21/2017 0419   HCT 28.7 (L) 05/21/2017 0419   HCT 30.6 (L) 03/09/2017 1200   PLT 318 05/21/2017 0419   PLT 429 (H) 03/09/2017 1200    BMET    Component Value Date/Time   NA 137 05/21/2017 0419   K 3.9 05/21/2017 0419   CL 100 (L) 05/21/2017 0419   CO2 27 05/21/2017 0419   GLUCOSE 106 (H) 05/21/2017 0419   BUN 8 05/21/2017 0419   CREATININE 0.72 05/21/2017 0419   CREATININE 1.33 (H) 03/03/2016 1531   CALCIUM 8.4 (L) 05/21/2017 0419   GFRNONAA >60 05/21/2017 0419   GFRAA >60 05/21/2017 0419    COAG Lab Results    Component Value Date   INR 1.19 05/17/2017   INR 1.07 01/09/2017   INR 1.09 01/31/2016   No results found for: PTT  Antibiotics Anti-infectives    Start     Dose/Rate Route Frequency Ordered Stop   05/21/17 0600  vancomycin (VANCOCIN) IVPB 1000 mg/200 mL premix     1,000 mg 200 mL/hr over 60 Minutes Intravenous To Surgery 05/20/17 0756 05/21/17 1710   05/17/17 1900  vancomycin (VANCOCIN) IVPB 1000 mg/200 mL premix     1,000 mg 200 mL/hr over 60 Minutes Intravenous Every 12 hours 05/17/17 1721 05/18/17 0719       Virgina Jock, PA-C [facility] Office: [phone] Pager: [phone] 05/23/2017 8:40 AM

## 2017-05-23 NOTE — Progress Notes (Signed)
Pt EKG NSR no ischemia Troponin 0.24  Will discuss with Dr Marlou Porch  Resume ASA/Eliquis  Ruta Hinds, MD [facility] Office: [phone] Pager: [phone]

## 2017-05-24 ENCOUNTER — Encounter: Payer: Self-pay | Admitting: Vascular Surgery

## 2017-05-24 DIAGNOSIS — D62 Acute posthemorrhagic anemia: Secondary | ICD-10-CM

## 2017-05-24 DIAGNOSIS — Z89611 Acquired absence of right leg above knee: Secondary | ICD-10-CM

## 2017-05-24 DIAGNOSIS — S78111A Complete traumatic amputation at level between right hip and knee, initial encounter: Secondary | ICD-10-CM

## 2017-05-24 DIAGNOSIS — I480 Paroxysmal atrial fibrillation: Secondary | ICD-10-CM

## 2017-05-24 DIAGNOSIS — R748 Abnormal levels of other serum enzymes: Secondary | ICD-10-CM

## 2017-05-24 DIAGNOSIS — R7989 Other specified abnormal findings of blood chemistry: Secondary | ICD-10-CM

## 2017-05-24 DIAGNOSIS — D72829 Elevated white blood cell count, unspecified: Secondary | ICD-10-CM

## 2017-05-24 DIAGNOSIS — D45 Polycythemia vera: Secondary | ICD-10-CM

## 2017-05-24 DIAGNOSIS — I739 Peripheral vascular disease, unspecified: Secondary | ICD-10-CM

## 2017-05-24 DIAGNOSIS — I10 Essential (primary) hypertension: Secondary | ICD-10-CM

## 2017-05-24 DIAGNOSIS — I998 Other disorder of circulatory system: Secondary | ICD-10-CM

## 2017-05-24 DIAGNOSIS — I2510 Atherosclerotic heart disease of native coronary artery without angina pectoris: Secondary | ICD-10-CM

## 2017-05-24 LAB — CBC
HCT: 29.2 % — ABNORMAL LOW (ref 36.0–46.0)
Hemoglobin: 8.3 g/dL — ABNORMAL LOW (ref 12.0–15.0)
MCH: 21.6 pg — ABNORMAL LOW (ref 26.0–34.0)
MCHC: 28.4 g/dL — ABNORMAL LOW (ref 30.0–36.0)
MCV: 75.8 fL — ABNORMAL LOW (ref 78.0–100.0)
PLATELETS: 353 10*3/uL (ref 150–400)
RBC: 3.85 MIL/uL — ABNORMAL LOW (ref 3.87–5.11)
RDW: 22.2 % — AB (ref 11.5–15.5)
WBC: 15.5 10*3/uL — ABNORMAL HIGH (ref 4.0–10.5)

## 2017-05-24 NOTE — Consult Note (Signed)
Physical Medicine and Rehabilitation Consult Reason for Consult: Decreased functional mobility Referring Physician: Dr. Donnetta Hutching   HPI: [name] is a 66 y.o. right handed female with history of ?polycythemia vera, remote tobacco abuse, CAD/NSTEMI, hypertension, atrial fibrillation maintained on Eliquis, peripheral vascular disease with multiple revascularization procedures. Per chart review and patient,patient lives with 12 year old granddaughter. Independent with a walker prior to admission. Granddaughter has just completed school and can assist. Patient also has a local elderly sister. One level home with 4 steps to entry. Presented 05/20/2017 with right lower extremity ischemic changes and rest pain that progressed over the past week. Underwent right femoral to tibioperoneal trunk bypass 05/17/2017 per Dr. Donnetta Hutching. Postoperative pain management with ongoing progressive ischemic changes and limb was not felt to be salvaged. Underwent right AKA 05/21/2017 per Dr. Donnetta Hutching.Eliquis resume postoperatively. Mildly elevated troponin 0.2 for follow-up conservatively by cardiology services. Await postoperative follow-up physical and occupational therapy evaluations. M.D. has requested physical medicine rehabilitation consult.   Review of Systems  Constitutional: Negative for chills and fever.  HENT: Negative for hearing loss.   Eyes: Negative for blurred vision.  Respiratory: Positive for shortness of breath. Negative for cough.   Cardiovascular: Negative for chest pain.  Gastrointestinal: Positive for constipation. Negative for nausea and vomiting.       GERD  Genitourinary: Negative for dysuria, flank pain and hematuria.  Musculoskeletal: Positive for joint pain and myalgias.  Skin: Negative for rash.  Neurological: Positive for weakness and headaches. Negative for seizures.  All other systems reviewed and are negative.  Past Medical History:  Diagnosis Date  . Arthritis    "fingers,  some in my knees" (01/28/2016)  . CAD (coronary artery disease), per cath 04/27/13, non obstructive disease, EF 65-70% 05/11/2013  . Carotid disease, bilateral ([place]) 09/2013   Lt carotid 50-69% stentosis, less on rt  . Dysrhythmia   . Fatty liver   . GERD (gastroesophageal reflux disease)   . Glaucoma   . H/O cardiovascular stress test 12/27/2009   negative for ischemia  . Headache    "occasional since eye pressure regulated" (01/28/2016)  . Hyperlipidemia   . Hypertension   . Leukocytosis 07/15/2015  . Migraine    "stopped w/laser holes to relieve pressure in my eyes; had them 3-4 times/wk before taht" (01/28/2016)  . Mitral regurgitation,2+ by cath 05/11/2013  . NSTEMI (non-ST elevated myocardial infarction) ([place]) 04/27/2013  . Pain    BOTH KNEES - PT HAS TORN MENISCUS LEFT KNEE  . Paroxysmal atrial fibrillation (HCC)    hx of a fib on ASA  AND ELIQUIS   . Polycythemia vera ([place]) 08/20/2015   JAK-2 positive 07/19/15  . PVD (peripheral vascular disease) with claudication ([place]) 07/2006   previous L ext iliac stent and rt SFA stent, known occluded Lt SFA  . S/P cardiac cath 04/27/13   NON OBSTRUCTIVE DISEASE, 2+ mr, ef 65-70%  . Statin intolerance   . Stroke ([employer]) 2006   SWELLING ALL OVER AND RT SIDE OF FACE DRAWN AND SPEECH SLURRED AND NUMBNESS ON RIGHT SIDE-- ALL RESOLVED   Past Surgical History:  Procedure Laterality Date  . AMPUTATION Right 05/21/2017   Procedure: AMPUTATION ABOVE KNEE;  Surgeon: Rosetta Posner, MD;  Location: [place];  Service: Vascular;  Laterality: Right;  . CARDIAC CATHETERIZATION  04/27/13   non occlusive disease with mild to mod. calcified lesions in ostial LM and proximal LAD and moderate prox. RC AND DISTAL AV GROOVE LCX, ef  .  CATARACT EXTRACTION W/PHACO Right 03/21/2015   Procedure: CATARACT EXTRACTION PHACO AND INTRAOCULAR LENS PLACEMENT RIGHT EYE;  Surgeon: Baruch Goldmann, MD;  [facility]: [facility];  Service: Ophthalmology;  Laterality: Right;  CDE:5.10  . CATARACT EXTRACTION  W/PHACO Left 05/16/2015   Procedure: CATARACT EXTRACTION PHACO AND INTRAOCULAR LENS PLACEMENT (IOC);  Surgeon: Baruch Goldmann, MD;  Location: [facility];  Service: Ophthalmology;  Laterality: Left;  CDE 5.57  . [place]  . COLONOSCOPY N/A 09/11/2016   Procedure: COLONOSCOPY;  Surgeon: Rogene Houston, MD;  [facility];  Service: Endoscopy;  Laterality: N/A;  730-moved to 1:00 Ann notified pt  . EMBOLECTOMY Right 02/01/2016   Procedure: Thrombectomy of Right Femoral-Popliteal Bypass Graft; Endarterectomy of Right Below Knee Popliteal Artery and Tibial Peroneal Trunk with Bovine Pericardium Patch Angioplasty.;  Surgeon: Angelia Mould, MD;  Location: [place];  Service: Vascular;  Laterality: Right;  . FEMORAL-POPLITEAL BYPASS GRAFT Right 01/31/2016   Procedure: BYPASS GRAFT RIGHT COMMON  FEMORALTO BELOW KNEE POPLITEAL ARTERY BYPASS GRAFT USING 6MM PROPATEN GORTEX GRAFT;  Surgeon: Mal Misty, MD;  Location: [place];  Service: Vascular;  Laterality: Right;  . FEMORAL-POPLITEAL BYPASS GRAFT Right 01/09/2017   Procedure: THROMBECTOMY OF RIGHT FEMORAL-POPLITEAL  ARTERY BYPASS GRAFT; THROMBECTOMY RIGHT  TIBIAL VESSELS;  Surgeon: [name], MD;  Location: [place];  Service: Vascular;  Laterality: Right;  . FEMORAL-POPLITEAL BYPASS GRAFT Right 05/17/2017   Procedure: RIGHT FEMORAL-TIBIAL PERONEAL TRUNK ARTERY BYPASS GRAFT USING 27mmX80cm PROPATEN GRAFT WITH REMOVABLE RING;  Surgeon: Rosetta Posner, MD;  Location: [place];  Service: Vascular;  Laterality: Right;  . FRACTURE SURGERY    . ILIAC ARTERY STENT  07/2006   external iliac and Rt SFA stent 07/2006 AND THE LEFT WAS IN 2006  . KNEE ARTHROSCOPY WITH MEDIAL MENISECTOMY Left 03/27/2014   Procedure: left knee arthorscopy with medial chondraplasty of the medial femoral and patella, medial microfracture technique of medial femoral condyl;  Surgeon: Tobi Bastos, MD;  Location: [facility];  Service: Orthopedics;  Laterality: Left;  . LEFT  HEART CATHETERIZATION WITH CORONARY ANGIOGRAM Right 04/27/2013   Procedure: LEFT HEART CATHETERIZATION WITH CORONARY ANGIOGRAM;  Surgeon: Leonie Man, MD;  Location: [facility] C[facility];  Service: Cardiovascular;  Laterality: Right;  . LOWER EXTREMITY ANGIOGRAM Right 01/31/2016   Procedure: INTRAOP RIGHT LOWER EXTREMITY ANGIOGRAM;  Surgeon: Mal Misty, MD;  Location: [place];  Service: Vascular;  Laterality: Right;  . ORIF WRIST FRACTURE Right 1983  . OVARIAN CYST REMOVAL Right   . PATCH ANGIOPLASTY Right 01/31/2016   Procedure: RIGHT COMMON FEMORAL AND PROFUNDA FEMORIS ENDARECTOMY WITH PATCH ANGIOPLASTY;  Surgeon: Mal Misty, MD;  Location: [place];  Service: Vascular;  Laterality: Right;  . PATCH ANGIOPLASTY Right 01/09/2017   Procedure: PATCH ANGIOPLASTY RIGHT POPLITEAL ARTERY BYPASS GRAFT;  Surgeon: [name], MD;  Location: [place];  Service: Vascular;  Laterality: Right;  . PERIPHERAL VASCULAR CATHETERIZATION N/A 06/27/2015   Procedure: Lower Extremity Angiography;  Surgeon: Lorretta Harp, MD;  Location: [place] CV LAB;  Service: Cardiovascular;  Laterality: N/A;  . PERIPHERAL VASCULAR CATHETERIZATION Bilateral 01/29/2016   Procedure: Lower Extremity Angiography;  Surgeon: Wellington Hampshire, MD;  Location: [place] CV LAB;  Service: Cardiovascular;  Laterality: Bilateral;  . PERIPHERAL VASCULAR CATHETERIZATION N/A 01/29/2016   Procedure: Abdominal Aortogram;  Surgeon: Wellington Hampshire, MD;  Location: [place] CV LAB;  Service: Cardiovascular;  Laterality: N/A;  . REFRACTIVE SURGERY Bilateral    "  6 in one eye; 7 in the other; to relieve pressure; not glaucoma" (01/28/2016)  . SFA Right 06/27/2015   overlapping Bahn covered stents  . TUBAL LIGATION  1983  . VEIN HARVEST Left 05/17/2017   Procedure: LEFT LEG GREATER SAPHENOUS VEIN HARVEST;  Surgeon: Rosetta Posner, MD;  Location: [place];  Service: Vascular;  Laterality: Left;  Marland Kitchen VEIN REPAIR Right 01/31/2016   Procedure: RIGHT GREATER  SAPHENOUS VEIN EXAMNED BUT NOT REMOVED;  Surgeon: Mal Misty, MD;  Location: [place];  Service: Vascular;  Laterality: Right;   Family History  Problem Relation Age of Onset  . CAD Mother   . Lung cancer Mother   . Heart attack Mother   . CAD Father   . Heart disease Father   . Heart attack Father 64       died in hes 42's with heart disease  . Healthy Sister   . Liver cancer Brother   . Healthy Sister   . CAD Brother        has had CABG  . CAD Brother    Social History:  reports that she quit smoking about 4 years ago. Her smoking use included Cigarettes. She has a 45.00 pack-year smoking history. She has never used smokeless tobacco. She reports that she does not drink alcohol or use drugs. Allergies:  Allergies  Allergen Reactions  . Atenolol Rash and Itching  . Clopidogrel Itching  . Demerol [Meperidine] Other (See Comments) and Itching    Unknown, can't remember  Unknown, can't remember   . Inderal [Propranolol] Other (See Comments) and Itching    Doesn't remember   . Oxycodone-Acetaminophen Itching  . Prednisone Other (See Comments)    Muscle spasms  . Statins Itching and Other (See Comments)    Simvastatin-caused severe itching Pravastatin-caused lesser tiching One type of statin? Caused stroke in 2006, and other symptoms as result  . Venlafaxine Itching  . Warfarin And Related Other (See Comments)    Caused nose bleeds  . Crestor [Rosuvastatin] Itching and Rash  . Esomeprazole Rash  . Lactose Intolerance (Gi) Other (See Comments)    Bloating, gas  . Lipitor [Atorvastatin] Rash  . Lovaza [Omega-3-Acid Ethyl Esters] Other (See Comments)    nosebleeds Doesn't remember   . Penicillins Hives, Other (See Comments) and Rash    Questionable high fever when taken and broke out in whelps.   . Pravastatin Itching and Rash  . Simvastatin Itching, Rash and Other (See Comments)    Also lipitor intolerant  . Trilipix [Choline Fenofibrate] Other (See Comments)     Doesn't remember   . Warfarin Other (See Comments)    nosebleeds  . Effexor [Venlafaxine Hcl] Other (See Comments)    Doesn't remember   . Nexium [Esomeprazole Magnesium] Rash  . Percocet [Oxycodone-Acetaminophen] Other (See Comments)    Doesn't remember   . Plavix [Clopidogrel Bisulfate] Rash   Medications Prior to Admission  Medication Sig Dispense Refill  . acetaminophen (TYLENOL) 500 MG tablet Take 1,000 mg by mouth every 6 (six) hours as needed for moderate pain.     Marland Kitchen apixaban (ELIQUIS) 5 MG TABS tablet Take 1 tablet (5 mg total) by mouth 2 (two) times daily. 60 tablet 1  . ARTIFICIAL TEAR OP Place 1 drop into both eyes as needed (for dry eyes).    Marland Kitchen aspirin EC 325 MG tablet Take 325 mg by mouth daily.    Marland Kitchen diltiazem (CARDIZEM CD) 180 MG 24 hr capsule Take  180 mg by mouth every morning.    . docusate sodium (COLACE) 100 MG capsule Take 200 mg by mouth every evening.     . ezetimibe (ZETIA) 10 MG tablet Take 1 tablet (10 mg total) by mouth at bedtime. PLEASE CONTACT OFFICE FOR ADDITIONAL REFILLS 7 tablet 0  . fenofibrate (TRICOR) 145 MG tablet Take 1 tablet (145 mg total) by mouth at bedtime. PLEASE CONTACT OFFICE FOR ADDITIONAL REFILLS 7 tablet 0  . ferrous sulfate 325 (65 FE) MG tablet Take 325 mg by mouth every evening.    . flecainide (TAMBOCOR) 50 MG tablet Take 1 tablet (50 mg total) by mouth 2 (two) times daily. PLEASE CONTACT OFFICE FOR ADDITIONAL REFILLS 15 tablet 0  . lisinopril (PRINIVIL,ZESTRIL) 20 MG tablet Take 20 mg by mouth daily.    Marland Kitchen omeprazole (PRILOSEC) 20 MG capsule Take 20 mg by mouth 2 (two) times daily before a meal.     . traMADol (ULTRAM) 50 MG tablet Take 1 tablet (50 mg total) by mouth every 6 (six) hours as needed. Currently: taking med between 2-3 times daily for pain (Patient taking differently: Take 100 mg by mouth 3 (three) times daily. Currently: taking med between 2-3 times daily for pain) 20 tablet 0    Home: Home Living Family/patient expects to  be discharged to:: Private residence Living Arrangements: Other relatives Available Help at Discharge: Family, Friend(s), Available PRN/intermittently Type of Home: House Home Access: Stairs to enter Technical brewer of Steps: 4 Entrance Stairs-Rails: Right, Left Home Layout: One level Bathroom Shower/Tub: Chiropodist: Standard Home Equipment: Environmental consultant - 2 wheels, Bedside commode, Shower seat, Grab bars - tub/shower, Hand held shower head, Adaptive equipment Adaptive Equipment: Reacher Additional Comments: pt uses BSC in tub shower and states she can sit on BSC and then swing legs into tub from there.   Functional History: Prior Function Level of Independence: Independent ADL's / Homemaking Assistance Needed: has a neighbor that is a CNA that can (A) if she needs to call her. Her name is "wanda" Comments: has 2 grandchildren that can help her after they get out of school Functional Status:  Mobility: Bed Mobility Overal bed mobility: Needs Assistance Bed Mobility: Supine to Sit, Sit to Supine Supine to sit: Min assist, HOB elevated Sit to supine: Min assist, HOB elevated General bed mobility comments: Patient required increased time, stopping frequently due to RLE pain.  Min assist to steady during transitions. Transfers Overall transfer level: Needs assistance Equipment used: Rolling walker (2 wheeled) Transfers: Sit to/from Stand, [employer] Transfers Sit to Stand: Mod assist Stand pivot transfers: Min assist General transfer comment: Verbal cues for hand placement.  Mod assist to power up onto LLE.  Min assist for balance in stance.  Cues to let RLE rest downward in neutral position.  Patient able to take several hop steps with RW and min-mod assist to transfer bed <> BSC. Ambulation/Gait Ambulation/Gait assistance: Min guard, Supervision Ambulation Distance (Feet): 25 Feet Assistive device: Rolling walker (2 wheeled) Gait Pattern/deviations: Step-to  pattern, Decreased step length - left, Decreased stance time - right, Antalgic General Gait Details: Patient declined today due to pain.    ADL: ADL Overall ADL's : Needs assistance/impaired Eating/Feeding: Independent Grooming: Wash/dry hands, Standing, Supervision/safety Upper Body Bathing: Independent Lower Body Bathing: Maximal assistance Lower Body Bathing Details (indicate cue type and reason): recommended long bath sponge Upper Body Dressing : Independent Lower Body Dressing: Maximal assistance Lower Body Dressing Details (indicate cue type and reason):  educated in use of her reacher and compensatory strategies for LB dressing, pt usually avoids socks or pulls her leg up onto the bed, pt not interested in sock aide Toilet Transfer: Supervision/safety, RW, BSC, Ambulation Toileting- Clothing Manipulation and Hygiene: Supervision/safety, Sit to/from stand Functional mobility during ADLs: Supervision/safety, Rolling walker General ADL Comments: Pt has had this type of sx 3 times, most recently in January. She is knowledgeble in uses of 3 in 1 and has no concerns about returning home with intermittent assist of her granddaughter.  Cognition: Cognition Overall Cognitive Status: Within Functional Limits for tasks assessed Orientation Level: Oriented X4 Cognition Arousal/Alertness: Awake/alert Behavior During Therapy: WFL for tasks assessed/performed, Anxious Overall Cognitive Status: Within Functional Limits for tasks assessed  Blood pressure (!) 138/46, pulse 72, temperature 97.6 F (36.4 C), temperature source Oral, resp. rate 18, height 5\' 5"  (1.651 m), weight 66.7 kg (147 lb), SpO2 100 %. Physical Exam  Vitals reviewed. Constitutional: She is oriented to person, place, and time. She appears well-developed and well-nourished.  HENT:  Head: Normocephalic and atraumatic.  Eyes: EOM are normal. Right eye exhibits no discharge. Left eye exhibits no discharge.  Neck: Normal range  of motion. Neck supple. No thyromegaly present.  Cardiovascular: Normal rate and regular rhythm.   Respiratory: Effort normal and breath sounds normal. No respiratory distress.  GI: Soft. Bowel sounds are normal. She exhibits no distension.  Musculoskeletal: She exhibits edema and tenderness.  Neurological: She is alert and oriented to person, place, and time.  Motor: B/l UE 5/5 proximal to distal LLE: 4+/5 (pain inhibition) RLE: HF 4/5 (pain inhibition)  Skin:  Amputation site is dressed appropriately tender  Psychiatric: She has a normal mood and affect. Her behavior is normal.    Results for orders placed or performed during the hospital encounter of 05/17/17 (from the past 24 hour(s))  Troponin I     Status: Abnormal   Collection Time: 05/23/17 10:02 AM  Result Value Ref Range   Troponin I 0.24 (HH) <0.03 ng/mL  CBC     Status: Abnormal   Collection Time: 05/23/17 10:02 AM  Result Value Ref Range   WBC 17.3 (H) 4.0 - 10.5 K/uL   RBC 4.03 3.87 - 5.11 MIL/uL   Hemoglobin 8.8 (L) 12.0 - 15.0 g/dL   HCT 30.3 (L) 36.0 - 46.0 %   MCV 75.2 (L) 78.0 - 100.0 fL   MCH 21.8 (L) 26.0 - 34.0 pg   MCHC 29.0 (L) 30.0 - 36.0 g/dL   RDW 22.0 (H) 11.5 - 15.5 %   Platelets 376 150 - 400 K/uL  Troponin I     Status: None   Collection Time: 05/23/17  2:38 PM  Result Value Ref Range   Troponin I <0.03 <0.03 ng/mL  Troponin I     Status: Abnormal   Collection Time: 05/23/17  9:14 PM  Result Value Ref Range   Troponin I 0.04 (HH) <0.03 ng/mL   No results found.  Assessment/Plan: Diagnosis: right AKA Labs independently reviewed.  Records reviewed and summated above. Clean amputation daily with soap and water Monitor incision site for signs of infection or impending skin breakdown. Staples to remain in place for 3-4 weeks Stump shrinker, for edema control  Scar mobilization massaging to prevent soft tissue adherence Stump protector during therapies Prevent flexion contractures by  implementing the following:   Encourage prone lying for 20-30 mins per day BID to avoid hip flexion  Contractures if medically appropriate;  Avoid pillow under  knees when patient is lying in bed in order to prevent both  knee and hip flexion contractures;  Avoid prolonged sitting Post surgical pain control with oral medication Phantom limb pain control with physical modalities including desensitization techniques (gentle self massage to the residual stump,hot packs if sensation intact, [place]) and mirror therapy, TENS. If ineffective, consider pharmacological treatment for neuropathic pain (e.g gabapentin, pregabalin, amytriptalyine, duloxetine).  When using wheelchair, patient should have knee on amputated side fully extended with board under the seat cushion. Avoid injury to contralateral side  1. Does the need for close, 24 hr/day medical supervision in concert with the patient's rehab needs make it unreasonable for this patient to be served in a less intensive setting? Yes 2. Co-Morbidities requiring supervision/potential complications: elevated troponin (monitoring), peripheral vascular disease (cont meds), polycythemia vera, remote tobacco abuse, CAD/NSTEMI (cont meds), HTN (monitor and provide prns in accordance with increased physical exertion and pain), atrial fibrillation (cont meds, monitor HR with increased physical activity), ABLA (transfuse if necessary to ensure appropriate perfusion for increased activity tolerance), leukocytosis (cont to monitor for signs and symptoms of infection, further workup if indicated) 3. Due to safety, skin/wound care, disease management, pain management and patient education, does the patient require 24 hr/day rehab nursing? Yes 4. Does the patient require coordinated care of a physician, rehab nurse, PT (1-2 hrs/day, 5 days/week) and OT (1-2 hrs/day, 5 days/week) to address physical and functional deficits in the context of the above medical diagnosis(es)?  Yes Addressing deficits in the following areas: balance, endurance, locomotion, strength, transferring, bathing, dressing, toileting and psychosocial support 5. Can the patient actively participate in an intensive therapy program of at least 3 hrs of therapy per day at least 5 days per week? Likely soon 6. The potential for patient to make measurable gains while on inpatient rehab is excellent 7. Anticipated functional outcomes upon discharge from inpatient rehab are supervision  with PT, modified independent and supervision with OT, n/a with SLP. 8. Estimated rehab length of stay to reach the above functional goals is: 7-11 days. 9. Anticipated D/C setting: Home 10. Anticipated post D/C treatments: HH therapy and Home excercise program 11. Overall Rehab/Functional Prognosis: good  RECOMMENDATIONS: This patient's condition is appropriate for continued rehabilitative care in the following setting: CIR likely in 1-2 days. Patient has agreed to participate in recommended program. Yes Note that insurance prior authorization may be required for reimbursement for recommended care.  Comment: Rehab Admissions Coordinator to follow up.  Delice Lesch, MD, Mellody Drown Cathlyn Parsons., PA-C 05/24/2017

## 2017-05-24 NOTE — Progress Notes (Signed)
Occupational Therapy Treatment Patient Details Name: [name] MRN: [id number] DOB: 1951-07-28 Today's Date: 05/24/2017    History of present illness 66 yo female s/p R femoral to tibioperoneal trunk bypass with composite gore-tex graft and great saphenous vein from the left thigh.  Now, patient s/p Rt AKA on 05/21/17.    PMH: arthritis, CAD, GERD, glaucoma, HTN, NSTEMI, PVD, CVA   OT comments  This 66 yo female now s/p R AKA since last seen by OT presents to acute OT making progress towards goals, but not doing as well as last session due to new sx but goals still appropriate. Pt will benefit from continued acute OT with follow up OT on CIR to get to a Mod I level to go home.  Follow Up Recommendations  CIR    Equipment Recommendations  None recommended by OT       Precautions / Restrictions Precautions Precautions: Fall Restrictions Weight Bearing Restrictions: Yes RLE Weight Bearing: Non weight bearing       Mobility Bed Mobility Overal bed mobility: Modified Independent Bed Mobility: Supine to Sit;Sit to Supine     Supine to sit: Modified independent (Device/Increase time) Sit to supine: Modified independent (Device/Increase time)      Transfers Overall transfer level: Needs assistance Equipment used: Rolling walker (2 wheeled) Transfers: Sit to/from Stand Sit to Stand: Min assist Stand pivot transfers: Min assist     General transfer comment: cues for hand placement   Balance Overall balance assessment: Needs assistance Sitting-balance support: No upper extremity supported (LLE supported) Sitting balance-Leahy Scale: Good     Standing balance support: Bilateral upper extremity supported;During functional activity Standing balance-Leahy Scale: Poor                            ADL either performed or assessed with clinical judgement   ADL Overall ADL's : Needs assistance/impaired     Grooming: Standing;Minimal assistance                Lower Body Dressing: Sit to/from stand;Minimal assistance   Toilet Transfer: Minimal assistance   Toileting- Clothing Manipulation and Hygiene: Minimal assistance;Sit to/from stand               Vision Baseline Vision/History: Wears glasses Wears Glasses: Reading only Patient Visual Report: No change from baseline            Cognition Arousal/Alertness: Awake/alert Behavior During Therapy: WFL for tasks assessed/performed Overall Cognitive Status: Within Functional Limits for tasks assessed                                          Exercises General Exercises - Lower Extremity Pt reported decreased relaxation in RLE with change of position (grabs). Had there work on RLE hip extension in standing and in left side lying.           Pertinent Vitals/ Pain       Pain Assessment: 0-10 Pain Score: 3  Pain Location: R LE and sensation of foot Pain Descriptors / Indicators:  (grabbing) Pain Intervention(s): Limited activity within patient's tolerance;Repositioned         Frequency  Min 2X/week        Progress Toward Goals  OT Goals(current goals can now be found in the care plan section)  Progress towards OT goals: Not progressing toward goals -  comment (pt has had RAKA since last seen; goals are still appropriate and will add additional ones as well)     Plan Discharge plan needs to be updated       AM-PAC PT "6 Clicks" Daily Activity     Outcome Measure   Help from another person eating meals?: None Help from another person taking care of personal grooming?: A Little Help from another person toileting, which includes using toliet, bedpan, or urinal?: A Little Help from another person bathing (including washing, rinsing, drying)?: A Little Help from another person to put on and taking off regular upper body clothing?: A Little Help from another person to put on and taking off regular lower body clothing?: A Little 6 Click Score: 19     End of Session Equipment Utilized During Treatment: Rolling walker  OT Visit Diagnosis: Unsteadiness on feet (R26.81);Pain Pain - Right/Left: Right Pain - part of body: Leg   Activity Tolerance Patient tolerated treatment well   Patient Left in bed;with call bell/phone within reach           Time: 1342-1400 OT Time Calculation (min): 18 min  Charges: OT General Charges $OT Visit: 1 Procedure OT Treatments $Self Care/Home Management : 8-22 mins Golden Circle, OTR/L [phone] 05/24/2017

## 2017-05-24 NOTE — Discharge Instructions (Signed)

## 2017-05-24 NOTE — Progress Notes (Signed)
Patient noted to have no BM since 05/17/17.  Patient was offered prn laxatives and prune juice.  Patient declined.

## 2017-05-24 NOTE — Clinical Social Work Placement (Signed)
   CLINICAL SOCIAL WORK PLACEMENT  NOTE  Date:  05/24/2017  Patient Details  Name: [name] MRN: [id number] Date of Birth: 06/24/1951  Clinical Social Work is seeking post-discharge placement for this patient at the [place] level of care (*CSW will initial, date and re-position this form in  chart as items are completed):  Yes   Patient/family provided with [facility] Work Department's list of facilities offering this level of care within the geographic area requested by the patient (or if unable, by the patient's family).  Yes   Patient/family informed of their freedom to choose among providers that offer the needed level of care, that participate in Medicare, Medicaid or managed care program needed by the patient, have an available bed and are willing to accept the patient.  Yes   Patient/family informed of [facility]'s ownership interest in [facility] and [facility], as well as of the fact that they are under no obligation to receive care at these facilities.  PASRR submitted to EDS on 05/24/17     PASRR number received on 05/24/17     Existing PASRR number confirmed on       FL2 transmitted to all facilities in geographic area requested by pt/family on 05/24/17     FL2 transmitted to all facilities within larger geographic area on       Patient informed that his/her managed care company has contracts with or will negotiate with certain facilities, including the following:            Patient/family informed of bed offers received.  Patient chooses bed at       Physician recommends and patient chooses bed at      Patient to be transferred to   on  .  Patient to be transferred to facility by       Patient family notified on   of transfer.  Name of family member notified:        PHYSICIAN Please sign FL2     Additional Comment:    _______________________________________________ Candie Chroman, LCSW 05/24/2017, 12:42  PM

## 2017-05-24 NOTE — Progress Notes (Signed)
I met with pt at bedside to discuss an inpt rehab admission pending insurance approval. Pt is in agreement. I await updated OT postop and then will begin insurance authorization with [place] health Care Medicare. OT is aware. Hopeful for admit Tuesday. [phone]

## 2017-05-24 NOTE — Care Management Important Message (Signed)
Important Message  Patient Details  Name: [name] MRN: [id number] Date of Birth: 12-16-1951   Medicare Important Message Given:  Yes    Nathen May 05/24/2017, 12:07 PM

## 2017-05-24 NOTE — Clinical Social Work Note (Signed)
Clinical Social Work Assessment  Patient Details  Name: [name] MRN: [id number] Date of Birth: [birth year]  Date of referral:  05/24/17               Reason for consult:  Facility Placement, Discharge Planning                Permission sought to share information with:  Chartered certified accountant granted to share information::  Yes, Verbal Permission Granted  Name::        Agency::  SNF's  Relationship::     Contact Information:     Housing/Transportation Living arrangements for the past 2 months:  Apartment Source of Information:  Patient, Medical Team Patient Interpreter Needed:  None Criminal Activity/Legal Involvement Pertinent to Current Situation/Hospitalization:  No - Comment as needed Significant Relationships:  Dependent Children, Siblings Lives with:  Relatives Do you feel safe going back to the place where you live?  Yes Need for family participation in patient care:  Yes (Comment)  Care giving concerns:  PT recommending CIR. CSW initiating SNF backup plan.   Social Worker assessment / plan:  CSW met with patient. No supports at bedside. CSW introduced role and explained that PT recommendations would be discussed. CIR is first preference but patient is agreeable to SNF backup plan if needed. CSW provided SNF list for patient to review. No further concerns. CSW encouraged patient to contact CSW as needed. CSW will continue to follow patient for support and facilitate discharge to SNF, if needed, once medically stable.  Employment status:  Disabled (Comment on whether or not currently receiving Disability) Insurance information:  Managed Medicare PT Recommendations:  Inpatient Rehab Consult Information / Referral to community resources:  [place]  Patient/Family's Response to care:  Patient agreeable to SNF backup plan if she cannot go to SNF. Patient's sisters and daughter supportive and involved in patient's care. Patient appreciated  social work intervention.  Patient/Family's Understanding of and Emotional Response to Diagnosis, Current Treatment, and Prognosis:  Patient has a good understanding of the reason for admission and her need for rehab prior to returning home. Patient appears happy with hospital care.  Emotional Assessment Appearance:  Appears stated age Attitude/Demeanor/Rapport:  Other (Pleasant) Affect (typically observed):  Accepting, Appropriate, Calm, Pleasant Orientation:  Oriented to Self, Oriented to Place, Oriented to  Time, Oriented to Situation Alcohol / Substance use:  Never Used Psych involvement (Current and /or in the community):  No (Comment)  Discharge Needs  Concerns to be addressed:  Care Coordination Readmission within the last 30 days:  No Current discharge risk:  Dependent with Mobility Barriers to Discharge:  Continued Medical Work up   Candie Chroman, LCSW 05/24/2017, 12:40 PM

## 2017-05-24 NOTE — NC FL2 (Signed)
[place] LEVEL OF CARE SCREENING TOOL     IDENTIFICATION  Patient Name: [name] Birthdate: [birth year] Sex: female Admission Date (Current Location): 05/17/2017  [facility] and [place] Number:  [employer] and Address:  [facility] [place], [place] [address], [place], [place] [address]      Provider Number: [id number]  Attending Physician Name and Address:  Rosetta Posner, MD  Relative Name and Phone Number:       Current Level of Care: Hospital Recommended Level of Care: [place] Prior Approval Number:    Date Approved/Denied:   PASRR Number: [id number]  Discharge Plan: SNF    Current Diagnoses: Patient Active Problem List   Diagnosis Date Noted  . Unilateral AKA, right ([place])   . Elevated troponin   . PVD (peripheral vascular disease) ([place])   . Benign essential HTN   . Acute blood loss anemia   . Ischemia of right lower extremity 05/17/2017  . Thrombosis of right femoral artery ([place]) 01/09/2017  . Ischemia of extremity 01/09/2017  . History of colonic polyps 07/30/2016  . Essential hypertension 01/08/2016  . Polycythemia vera ([place]) 08/20/2015  . Leukocytosis 07/15/2015  . S/P arterial stent right SFA overlapping [place] covered stents 06/27/2015 06/28/2015  . Claudication ([place]) 06/27/2015  . Carotid artery disease ([place]) 08/24/2014  . Osteoarthritis of left knee 03/27/2014  . External hemorrhoids 06/06/2013  . PAF (paroxysmal atrial fibrillation), maintaing SR 05/11/2013  . Chronic anticoagulation, new- eliquis 05/11/2013  . CAD (coronary artery disease), per cath 04/27/13, non obstructive disease 20-30% LM; LAD 30%; 50-60% OM2; RCA 40%; EF 65-70% 05/11/2013  . Mitral regurgitation,2+ by cath 05/11/2013  . Atrial fibrillation with RVR, hx of PAF previously 04/27/2013  . PAD (peripheral artery disease), Lt carotid 50-70%: , Hx stent Lt exteral iliac & RSFA stent, known occl. LSFA  04/27/2013  . Hyperlipidemia  04/27/2013  . History of CVA (cerebrovascular accident) 04/27/2013  . NSTEMI (non-ST elevated myocardial infarction) ([place]) 04/27/2013    Orientation RESPIRATION BLADDER Height & Weight     Self, Time, Situation, Place  O2 (Nasal Canula 3 L) Continent Weight: 147 lb (66.7 kg) Height:  5\' 5"  (165.1 cm)  BEHAVIORAL SYMPTOMS/MOOD NEUROLOGICAL BOWEL NUTRITION STATUS   (None)  (History of CVA) Continent Diet (Heart healthy)  AMBULATORY STATUS COMMUNICATION OF NEEDS Skin   Extensive Assist Verbally Surgical wounds                       Personal Care Assistance Level of Assistance  Bathing, Feeding, Dressing Bathing Assistance: Maximum assistance Feeding assistance: Independent Dressing Assistance: Maximum assistance     Functional Limitations Info  Sight, Hearing, Speech Sight Info: Adequate Hearing Info: Adequate Speech Info: Adequate    SPECIAL CARE FACTORS FREQUENCY  PT (By licensed PT), Blood pressure     PT Frequency: 5 x week              Contractures Contractures Info: Not present    Additional Factors Info  Code Status, Allergies Code Status Info: Full Allergies Info: Atenolol, Clopidogrel, Demerol (Meperidine), Inderal (Propranolol), Oxycodone-acetaminophen, Prednisone, Statins, Venlafaxine, Warfarin And Related, Crestor (Rosuvastatin), Esomeprazole, Lactose Intolerance (Gi), Lipitor (Atorvastatin), Lovaza (Omega-3-acid Ethyl Esters), Penicillins, Pravastatin, Simvastatin, Trilipix (Choline Fenofibrate), Warfarin, Effexor (Venlafaxine Hcl), Nexium (Esomeprazole Magnesium), Percocet (Oxycodone-acetaminophen), Plavix (Clopidogrel Bisulfate)           Current Medications (05/24/2017):  This is the current hospital active medication list Current Facility-Administered  Medications  Medication Dose Route Frequency Provider Last Rate Last Dose  . acetaminophen (TYLENOL) tablet 325-650 mg  325-650 mg Oral Q6H PRN Gabriel Earing, PA-C   650 mg at 05/24/17 2376    Or  . acetaminophen (TYLENOL) suppository 325-650 mg  325-650 mg Rectal Q6H PRN Rhyne, Samantha J, PA-C      . alum & mag hydroxide-simeth (MAALOX/MYLANTA) 200-200-20 MG/5ML suspension 15-30 mL  15-30 mL Oral Q2H PRN Rhyne, Samantha J, PA-C      . apixaban (ELIQUIS) tablet 5 mg  5 mg Oral BID [name], MD   5 mg at 05/24/17 0954  . aspirin EC tablet 325 mg  325 mg Oral Daily [name], MD   325 mg at 05/24/17 0954  . bisacodyl (DULCOLAX) suppository 10 mg  10 mg Rectal Daily PRN Rhyne, Samantha J, PA-C      . diltiazem (CARDIZEM CD) 24 hr capsule 180 mg  180 mg Oral q morning - 10a Rhyne, Samantha J, PA-C   180 mg at 05/24/17 0955  . docusate sodium (COLACE) capsule 200 mg  200 mg Oral QPM Rhyne, Samantha J, PA-C   200 mg at 05/23/17 1609  . ezetimibe (ZETIA) tablet 10 mg  10 mg Oral QHS Rhyne, Samantha J, PA-C   10 mg at 05/23/17 2124  . flecainide (TAMBOCOR) tablet 50 mg  50 mg Oral BID Gabriel Earing, PA-C   50 mg at 05/24/17 0955  . guaiFENesin-dextromethorphan (ROBITUSSIN DM) 100-10 MG/5ML syrup 15 mL  15 mL Oral Q4H PRN Rhyne, Samantha J, PA-C      . hydrALAZINE (APRESOLINE) injection 5 mg  5 mg Intravenous Q20 Min PRN Rhyne, Hulen Shouts, PA-C   5 mg at 05/23/17 0607  . lisinopril (PRINIVIL,ZESTRIL) tablet 20 mg  20 mg Oral Daily Early, Arvilla Meres, MD   20 mg at 05/24/17 0954  . magnesium sulfate IVPB 2 g 50 mL  2 g Intravenous Daily PRN Rhyne, Samantha J, PA-C      . metoprolol tartrate (LOPRESSOR) injection 2-5 mg  2-5 mg Intravenous Q2H PRN Rhyne, Samantha J, PA-C      . ondansetron (ZOFRAN) injection 4 mg  4 mg Intravenous Q6H PRN Rhyne, Samantha J, PA-C   4 mg at 05/21/17 1700  . pantoprazole (PROTONIX) EC tablet 40 mg  40 mg Oral Daily Rhyne, Hulen Shouts, PA-C   40 mg at 05/24/17 0954  . phenol (CHLORASEPTIC) mouth spray 1 spray  1 spray Mouth/Throat PRN Rhyne, Samantha J, PA-C      . polyethylene glycol (MIRALAX / GLYCOLAX) packet 17 g  17 g Oral Daily PRN Rhyne, Samantha  J, PA-C      . polyvinyl alcohol (LIQUIFILM TEARS) 1.4 % ophthalmic solution 1 drop  1 drop Both Eyes TID PRN Rhyne, Samantha J, PA-C      . potassium chloride SA (K-DUR,KLOR-CON) CR tablet 20-40 mEq  20-40 mEq Oral Daily PRN Rhyne, Samantha J, PA-C      . simethicone (MYLICON) chewable tablet 80 mg  80 mg Oral QID PRN [name], MD      . traMADol Veatrice Bourbon) tablet 100 mg  100 mg Oral Q6H PRN Rhyne, Samantha J, PA-C   100 mg at 05/24/17 2831     Discharge Medications: Please see discharge summary for a list of discharge medications.  Relevant Imaging Results:  Relevant Lab Results:   Additional Information SS#: [id number]  Candie Chroman, LCSW

## 2017-05-24 NOTE — Progress Notes (Signed)
Orthopedic Tech Progress Note Patient Details:  [name] 1951/02/22 [id number]  Patient ID: [name], female   DOB: [birth year], 66 y.o.   MRN: [id number]   Hildred Priest 05/24/2017, 9:26 AM Called in bio-tech brace order; spoke with Bella Kennedy

## 2017-05-24 NOTE — Progress Notes (Signed)
Physical Therapy Treatment Patient Details Name: [name] MRN: [id number] DOB: December 27, 1950 Today's Date: 05/24/2017    History of Present Illness 66 yo female s/p R femoral to tibioperoneal trunk bypass with composite gore-tex graft and great saphenous vein from the left thigh.  Now, patient s/p Rt AKA on 05/21/17.    PMH: arthritis, CAD, GERD, glaucoma, HTN, NSTEMI, PVD, CVA    PT Comments    Pt very pleasant and reports feeling better now than when she was admitted. Pt moving well with bed mobility, basic transfers and gait with shoe. Pt fatigues with gait and educated for HEP and positioning of RLE as well as desensitization. Continue to recommend CIR. Will follow.    Follow Up Recommendations  CIR;Supervision for mobility/OOB     Equipment Recommendations  Wheelchair (measurements PT);Wheelchair cushion (measurements PT);Rolling walker with 5" wheels    Recommendations for Other Services       Precautions / Restrictions Precautions Precautions: Fall    Mobility  Bed Mobility Overal bed mobility: Modified Independent                Transfers Overall transfer level: Needs assistance   Transfers: Sit to/from Stand;Squat Pivot Transfers Sit to Stand: Min assist   Squat pivot transfers: Min assist     General transfer comment: cues for hand placement, sequence and safety, assist to rise and assist to perform pivot to [facility]  Ambulation/Gait Ambulation/Gait assistance: Min assist;+2 safety/equipment (+1 for chair) Ambulation Distance (Feet): 35 Feet Assistive device: Rolling walker (2 wheeled) Gait Pattern/deviations: Step-to pattern   Gait velocity interpretation: Below normal speed for age/gender General Gait Details: cues for position in RW and not stepping past wheels. Cues for posture and pt cueing herself for right hip extension in standing   Stairs            Wheelchair Mobility    Modified Rankin (Stroke Patients Only)       Balance  Overall balance assessment: Needs assistance   Sitting balance-Leahy Scale: Good       Standing balance-Leahy Scale: Poor Standing balance comment: pt able to stand with single UE support at sink, propping on forearms to wash hands                            Cognition Arousal/Alertness: Awake/alert Behavior During Therapy: WFL for tasks assessed/performed Overall Cognitive Status: Within Functional Limits for tasks assessed                                        Exercises General Exercises - Lower Extremity Long Arc Quad: AROM;Left;Seated;15 reps Hip Flexion/Marching: AROM;15 reps;Left;Seated Amputee Exercises Hip Extension: AROM;Right;Seated;15 reps Hip ABduction/ADduction: AROM;Right;Seated;15 reps    General Comments        Pertinent Vitals/Pain Pain Score: 4  Pain Location: R LE and sensation of foot Pain Descriptors / Indicators: Sore Pain Intervention(s): Limited activity within patient's tolerance;Repositioned;Other (comment) (educated for limb desensitization)    Home Living                      Prior Function            PT Goals (current goals can now be found in the care plan section) Progress towards PT goals: Progressing toward goals    Frequency  PT Plan Current plan remains appropriate    Co-evaluation              AM-PAC PT "6 Clicks" Daily Activity  Outcome Measure  Difficulty turning over in bed (including adjusting bedclothes, sheets and blankets)?: None Difficulty moving from lying on back to sitting on the side of the bed? : None Difficulty sitting down on and standing up from a chair with arms (e.g., wheelchair, bedside commode, etc,.)?: Total Help needed moving to and from a bed to chair (including a wheelchair)?: A Little Help needed walking in hospital room?: A Little Help needed climbing 3-5 steps with a railing? : A Lot 6 Click Score: 17    End of Session Equipment Utilized  During Treatment: Gait belt Activity Tolerance: Patient tolerated treatment well Patient left: in chair;with call bell/phone within reach Nurse Communication: Mobility status PT Visit Diagnosis: Difficulty in walking, not elsewhere classified (R26.2);Pain;Other abnormalities of gait and mobility (R26.89) Pain - Right/Left: Right Pain - part of body: Leg     Time: 1047-1110 PT Time Calculation (min) (ACUTE ONLY): 23 min  Charges:  $Gait Training: 8-22 mins $Therapeutic Exercise: 8-22 mins                    G Codes:       Elwyn Reach, PT [phone]    [place] 05/24/2017, 1:02 PM

## 2017-05-24 NOTE — PMR Pre-admission (Signed)
PMR Admission Coordinator Pre-Admission Assessment  Patient: [name] is an 66 y.o., female MRN: [id number] DOB: November 21, 1951 Height: 5\' 5"  (165.1 cm) Weight: 66.7 kg (147 lb)              Insurance Information HMO: yes    PPO:      PCP:     IPA:      80/20:      OTHER: medicare advantage plan PRIMARY: [employer] Medicare      Policy#: [id number]      Subscriber: pt CM Name: Orvan July      Phone#: [phone]     Fax#: [phone] Pre-Cert#: [id number] approved for 7 days with f/u CM Vevelyn Royals phone [phone] fax [phone]      Employer: retired Benefits:  Phone #: online     Name: 05/24/17 Eff. Date: 12/21/16     Deduct: none      Out of Pocket Max: [phone]      Life Max: none CIR: no co pay days 1-90      SNF: no co pay days 1-20; $167.50 co pay days 21-100; no co pay with Medicaid [place] Outpatient: no cp ay      Co-Pay: visits per medical neccesity Home Health: 100%      Co-Pay: visits per medical neccesity DME: 80%     Co-Pay: 20% Providers: in metalwork  SECONDARY: Medicaid [place] Access      Policy#: [id number]      Subscriber: pt  Medicaid Application Date:       Case Manager:  Disability Application Date:       Case Worker:   Emergency Contact Information Contact Information    Name Relation Home Work Mobile   [place] Sister [phone]  [phone]   Adams,Peggy Sister [phone]  [phone]   Linus Galas Daughter   [phone]     Current Medical History  Patient Admitting Diagnosis:  Right AKA  History of Present Illness:HPI: [name] is a 66 y.o. right handed female with history of ?polycythemia vera, remote tobacco abuse, CAD/NSTEMI, hypertension, atrial fibrillation maintained on Eliquis, peripheral vascular disease with multiple revascularization procedures.  Presented 05/20/2017 with right lower extremity ischemic changes and rest pain that progressed over the past week. Underwent right femoral to tibioperoneal trunk bypass  05/17/2017 per Dr. Donnetta Hutching. Postoperative pain management with ongoing progressive ischemic changes and limb was not felt to be salvaged. Underwent right AKA 05/21/2017 per Dr. Donnetta Hutching.Eliquis resume postoperatively. Mildly elevated troponin 0.2 for follow-up conservatively by cardiology services.  Past Medical History  Past Medical History:  Diagnosis Date  . Arthritis    "fingers, some in my knees" (01/28/2016)  . CAD (coronary artery disease), per cath 04/27/13, non obstructive disease, EF 65-70% 05/11/2013  . Carotid disease, bilateral ([place]) 09/2013   Lt carotid 50-69% stentosis, less on rt  . Dysrhythmia   . Fatty liver   . GERD (gastroesophageal reflux disease)   . Glaucoma   . H/O cardiovascular stress test 12/27/2009   negative for ischemia  . Headache    "occasional since eye pressure regulated" (01/28/2016)  . Hyperlipidemia   . Hypertension   . Leukocytosis 07/15/2015  . Migraine    "stopped w/laser holes to relieve pressure in my eyes; had them 3-4 times/wk before taht" (01/28/2016)  . Mitral regurgitation,2+ by cath 05/11/2013  . NSTEMI (non-ST elevated myocardial infarction) ([place]) 04/27/2013  . Pain    BOTH KNEES - PT HAS TORN MENISCUS LEFT KNEE  . Paroxysmal atrial  fibrillation (HCC)    hx of a fib on ASA  AND ELIQUIS   . Polycythemia vera ([place]) 08/20/2015   JAK-2 positive 07/19/15  . PVD (peripheral vascular disease) with claudication ([place]) 07/2006   previous L ext iliac stent and rt SFA stent, known occluded Lt SFA  . S/P cardiac cath 04/27/13   NON OBSTRUCTIVE DISEASE, 2+ mr, ef 65-70%  . Statin intolerance   . Stroke ([place]) 2006   SWELLING ALL OVER AND RT SIDE OF FACE DRAWN AND SPEECH SLURRED AND NUMBNESS ON RIGHT SIDE-- ALL RESOLVED    Family History  family history includes CAD in her brother, brother, father, and mother; Healthy in her sister and sister; Heart attack in her mother; Heart attack (age of onset: 85) in her father; Heart disease in her father; Liver cancer in her  brother; Lung cancer in her mother.  Prior Rehab/Hospitalizations:  Has the patient had major surgery during 100 days prior to admission? Yes  Current Medications   Current Facility-Administered Medications:  .  acetaminophen (TYLENOL) tablet 325-650 mg, 325-650 mg, Oral, Q6H PRN, 650 mg at 05/25/17 0706 **OR** acetaminophen (TYLENOL) suppository 325-650 mg, 325-650 mg, Rectal, Q6H PRN, Rhyne, Samantha J, PA-C .  alum & mag hydroxide-simeth (MAALOX/MYLANTA) 200-200-20 MG/5ML suspension 15-30 mL, 15-30 mL, Oral, Q2H PRN, Rhyne, Samantha J, PA-C .  apixaban (ELIQUIS) tablet 5 mg, 5 mg, Oral, BID, [name], MD, 5 mg at 05/24/17 2142 .  aspirin EC tablet 325 mg, 325 mg, Oral, Daily, [name], MD, 325 mg at 05/24/17 0954 .  bisacodyl (DULCOLAX) suppository 10 mg, 10 mg, Rectal, Daily PRN, Rhyne, Samantha J, PA-C .  diltiazem (CARDIZEM CD) 24 hr capsule 180 mg, 180 mg, Oral, q morning - 10a, Rhyne, Samantha J, PA-C, 180 mg at 05/24/17 0955 .  docusate sodium (COLACE) capsule 200 mg, 200 mg, Oral, QPM, Rhyne, Samantha J, PA-C, 200 mg at 05/24/17 1746 .  ezetimibe (ZETIA) tablet 10 mg, 10 mg, Oral, QHS, Rhyne, Samantha J, PA-C, 10 mg at 05/24/17 2142 .  flecainide (TAMBOCOR) tablet 50 mg, 50 mg, Oral, BID, Rhyne, Samantha J, PA-C, 50 mg at 05/24/17 2142 .  guaiFENesin-dextromethorphan (ROBITUSSIN DM) 100-10 MG/5ML syrup 15 mL, 15 mL, Oral, Q4H PRN, Rhyne, Samantha J, PA-C .  hydrALAZINE (APRESOLINE) injection 5 mg, 5 mg, Intravenous, Q20 Min PRN, Rhyne, Samantha J, PA-C, 5 mg at 05/23/17 0607 .  lisinopril (PRINIVIL,ZESTRIL) tablet 20 mg, 20 mg, Oral, Daily, Early, Arvilla Meres, MD, 20 mg at 05/24/17 0954 .  magnesium sulfate IVPB 2 g 50 mL, 2 g, Intravenous, Daily PRN, Rhyne, Samantha J, PA-C .  metoprolol tartrate (LOPRESSOR) injection 2-5 mg, 2-5 mg, Intravenous, Q2H PRN, Rhyne, Samantha J, PA-C .  morphine 2 MG/ML injection 1-3 mg, 1-3 mg, Intravenous, Q1H PRN, Trinh, Kimberly A,  PA-C .  ondansetron (ZOFRAN) injection 4 mg, 4 mg, Intravenous, Q6H PRN, Rhyne, Samantha J, PA-C, 4 mg at 05/21/17 1700 .  pantoprazole (PROTONIX) EC tablet 40 mg, 40 mg, Oral, Daily, Rhyne, Samantha J, PA-C, 40 mg at 05/24/17 0954 .  phenol (CHLORASEPTIC) mouth spray 1 spray, 1 spray, Mouth/Throat, PRN, Rhyne, Samantha J, PA-C .  polyethylene glycol (MIRALAX / GLYCOLAX) packet 17 g, 17 g, Oral, Daily PRN, Rhyne, Samantha J, PA-C .  polyvinyl alcohol (LIQUIFILM TEARS) 1.4 % ophthalmic solution 1 drop, 1 drop, Both Eyes, TID PRN, Rhyne, Samantha J, PA-C .  potassium chloride SA (K-DUR,KLOR-CON) CR tablet 20-40 mEq, 20-40 mEq, Oral, Daily PRN,  Rhyne, Hulen Shouts, PA-C .  simethicone (MYLICON) chewable tablet 80 mg, 80 mg, Oral, QID PRN, [name], MD .  traMADol Veatrice [place]bon) tablet 100 mg, 100 mg, Oral, Q6H PRN, Rhyne, Samantha J, PA-C, 100 mg at 05/25/17 0355  Patients Current Diet: Diet Heart Room service appropriate? Yes; Fluid consistency: Thin  Precautions / Restrictions Precautions Precautions: Fall Precaution Comments: watch BP Restrictions Weight Bearing Restrictions: Yes RLE Weight Bearing: Non weight bearing   Has the patient had 2 or more falls or a fall with injury in the past year?No  Prior Activity Level Community (5-7x/wk): Independent pta; drove; like to keep active   Pt cared for husband at home for 9 years with a CVA and an amputation. Pt is a retired [employer] manager.  Home Assistive Devices / Equipment Home Assistive Devices/Equipment: Cane (specify quad or straight), Bedside commode/3-in-1 Home Equipment: Walker - 2 wheels, Bedside commode, Shower seat, Grab bars - tub/shower, Hand held shower head, Adaptive equipment  Prior Device Use: Indicate devices/aids used by the patient prior to current illness, exacerbation or injury? Walker  Prior Functional Level Prior Function Level of Independence: Independent ADL's / Homemaking Assistance Needed: has a neighbor that is a CNA  that can (A) if she needs to call her. Her name is "wanda" Comments: pt can return to her own home, or go stay with her sister if needed  Self Care: Did the patient need help bathing, dressing, using the toilet or eating?  Independent  Indoor Mobility: Did the patient need assistance with walking from room to room (with or without device)? Independent  Stairs: Did the patient need assistance with internal or external stairs (with or without device)? Independent  Functional Cognition: Did the patient need help planning regular tasks such as shopping or remembering to take medications? Independent  Current Functional Level Cognition  Overall Cognitive Status: Within Functional Limits for tasks assessed Orientation Level: Oriented X4    Extremity Assessment (includes Sensation/Coordination)  Upper Extremity Assessment: Defer to OT evaluation  Lower Extremity Assessment: RLE deficits/detail RLE Deficits / Details: New AKA.  Able to flex hip against gravity.  Patient reports she still feels her Rt foot. RLE: Unable to fully assess due to pain    ADLs  Overall ADL's : Needs assistance/impaired Eating/Feeding: Independent Grooming: Standing, Minimal assistance Upper Body Bathing: Independent Lower Body Bathing: Maximal assistance Lower Body Bathing Details (indicate cue type and reason): recommended long bath sponge Upper Body Dressing : Independent Lower Body Dressing: Sit to/from stand, Minimal assistance Lower Body Dressing Details (indicate cue type and reason): educated in use of her reacher and compensatory strategies for LB dressing, pt usually avoids socks or pulls her leg up onto the bed, pt not interested in sock aide Toilet Transfer: Minimal assistance Toileting- Clothing Manipulation and Hygiene: Minimal assistance, Sit to/from stand Functional mobility during ADLs: Supervision/safety, Rolling walker General ADL Comments: Pt has had this type of sx 3 times, most recently in  January. She is knowledgeble in uses of 3 in 1 and has no concerns about returning home with intermittent assist of her granddaughter.    Mobility  Overal bed mobility: Modified Independent Bed Mobility: Supine to Sit, Sit to Supine Supine to sit: Modified independent (Device/Increase time) Sit to supine: Modified independent (Device/Increase time) General bed mobility comments: Patient required increased time, stopping frequently due to RLE pain.  Min assist to steady during transitions.    Transfers  Overall transfer level: Needs assistance Equipment used: Rolling walker (2 wheeled) Transfers:  Sit to/from Stand Sit to Stand: Min assist Stand pivot transfers: Min assist Squat pivot transfers: Min assist General transfer comment: cues for hand placement, sequence and safety, assist to rise and assist to perform pivot to [facility]    Ambulation / Gait / Stairs / Wheelchair Mobility  Ambulation/Gait Ambulation/Gait assistance: Min assist, +2 safety/equipment (+1 for chair) Ambulation Distance (Feet): 35 Feet Assistive device: Rolling walker (2 wheeled) Gait Pattern/deviations: Step-to pattern General Gait Details: cues for position in RW and not stepping past wheels. Cues for posture and pt cueing herself for right hip extension in standing Gait velocity interpretation: Below normal speed for age/gender    Posture / Balance Balance Overall balance assessment: Needs assistance Sitting-balance support: No upper extremity supported (LLE supported) Sitting balance-Leahy Scale: Good Standing balance support: Bilateral upper extremity supported, During functional activity Standing balance-Leahy Scale: Poor Standing balance comment: pt able to stand with single UE support at sink, propping on forearms to wash hands    Special needs/care consideration BiPAP/CPAP  N/a CPM  N/a Continuous Drip IV  N/a Dialysis  N/a Life Vest  N/a Oxygen  N/a Special Bed  N/a Trach Size  N/a Wound Vac  n/a Skin ecchymosis left arm and r hand; surgical incision with dressing Bowel mgmt: LBM 05/17/17  Bladder mgmt:continent Diabetic mgmt  N/a   Previous Home Environment Living Arrangements:  (pt's 85 year old grand daughter lives with her but is going )  Lives With: Family Available Help at Discharge: Family, Available 24 hours/day (pt can go stay with her sister, Jana Half, if 24/7 supervision ) Type of Home: Apartment Home Layout: One level Home Access: Stairs to enter Entrance Stairs-Rails: Right, Left Entrance Stairs-Number of Steps: 4 Bathroom Shower/Tub: Tub/shower unit, Architectural technologist: Standard Bathroom Accessibility: Yes How Accessible: Accessible via walker Home Care Services: No Additional Comments: pt uses BSC in tub shower and states she can sit on BSC and then swing legs into tub from there.    patient's 69 year old grand daughter lives with her, but is visiting her mother in [place].  Discharge Living Setting Plans for Discharge Living Setting: Patient's home (55 year old grand daughter typically lives with her but goin) Type of Home at Discharge: Apartment Discharge Home Layout: One level Discharge Home Access: Stairs to enter Entrance Stairs-Rails: Right, Left Entrance Stairs-Number of Steps: 3 steps onto porch and then 1 step into home Discharge Bathroom Shower/Tub: Tub/shower unit, Curtain Discharge Bathroom Toilet: Standard Discharge Bathroom Accessibility: Yes How Accessible: Accessible via walker Does the patient have any problems obtaining your medications?: No   Patient can return to her home if Mod I. Otherwise she can go stay with a sister who can provide 24/7 supervision.  Social/Family/Support Systems Patient Roles: Caregiver Contact Information: sister, Jana Half Anticipated Caregiver: sister Jana Half Anticipated Ambulance person Information: see above Caregiver Availability: 24/7 Discharge Plan Discussed with Primary Caregiver: Yes Is  Caregiver In Agreement with Plan?: Yes Does Caregiver/Family have Issues with Lodging/Transportation while Pt is in Rehab?: No  Goals/Additional Needs Patient/Family Goal for Rehab: Mod I to supervision with PT and OT Expected length of stay: ELOS 7-11 days Pt/Family Agrees to Admission and willing to participate: Yes Program Orientation Provided & Reviewed with Pt/Caregiver Including Roles  & Responsibilities: Yes  Decrease burden of Care through IP rehab admission: n/a  Possible need for SNF placement upon discharge: not anticipated  Patient Condition: This patient's condition remains as documented in the consult dated 05/24/2017, in which the Rehabilitation Physician determined  and documented that the patient's condition is appropriate for intensive rehabilitative care in an inpatient rehabilitation facility. Will admit to inpatient rehab today.  Preadmission Screen Completed By:  Cleatrice Burke, 05/25/2017 10:17 AM ______________________________________________________________________   Discussed status with Dr. Naaman Plummer on 05/25/2017 at  1017 and received telephone approval for admission today.  Admission Coordinator:  Cleatrice Burke, time 4949 Date 05/25/2017

## 2017-05-24 NOTE — Progress Notes (Addendum)
[facility]  Subjective  - Doing OK this am.  No chest pain.   Objective (!) 138/46 72 97.6 F (36.4 C) (Oral) 18 100%  Intake/Output Summary (Last 24 hours) at 05/24/17 0727 Last data filed at 05/23/17 1814  Gross per 24 hour  Intake              690 ml  Output              300 ml  Net              390 ml    Right AKA dressing clean and dry Complains of lumbar pain from being in the bed all the time, no acute trauma. Lungs no labored breathing Heart RRR  Assessment/Planning: 66 y.o. female is s/p: right AKA, [name] occlusion right femoral-tpt trunk bypass 05/17/17 3 Days Post-Op  ASA/Eliquis Troponin's last lab 0.04 Will order AKA stocken from [facility], [place] 05/24/2017 7:27 AM --  Laboratory Lab Results:  Recent Labs  05/23/17 1002 05/24/17 0522  WBC 17.3* PENDING  HGB 8.8* 8.3*  HCT 30.3* 29.2*  PLT 376 PENDING   BMET No results for input(s): NA, K, CL, CO2, GLUCOSE, BUN, CREATININE, CALCIUM in the last 72 hours.  COAG Lab Results  Component Value Date   INR 1.19 05/17/2017   INR 1.07 01/09/2017   INR 1.09 01/31/2016   No results found for: PTT  I have examined the patient, reviewed and agree with above.Still with soreness but more comfortable. Hope to transfer to inpatient rehabilitation.  Stacey Jews, MD 05/24/2017 5:14 PM

## 2017-05-25 ENCOUNTER — Encounter (HOSPITAL_COMMUNITY): Payer: Self-pay

## 2017-05-25 ENCOUNTER — Inpatient Hospital Stay (HOSPITAL_COMMUNITY)
Admission: RE | Admit: 2017-05-25 | Discharge: 2017-05-31 | DRG: 560 | Disposition: A | Discharge status: Home Health | Payer: Medicare Other | Source: Intra-hospital | Attending: Physical Medicine & Rehabilitation | Admitting: Physical Medicine & Rehabilitation

## 2017-05-25 DIAGNOSIS — R234 Changes in skin texture: Secondary | ICD-10-CM | POA: Diagnosis not present

## 2017-05-25 DIAGNOSIS — G546 Phantom limb syndrome with pain: Secondary | ICD-10-CM

## 2017-05-25 DIAGNOSIS — I739 Peripheral vascular disease, unspecified: Secondary | ICD-10-CM | POA: Diagnosis present

## 2017-05-25 DIAGNOSIS — E785 Hyperlipidemia, unspecified: Secondary | ICD-10-CM | POA: Diagnosis present

## 2017-05-25 DIAGNOSIS — Z87891 Personal history of nicotine dependence: Secondary | ICD-10-CM

## 2017-05-25 DIAGNOSIS — I169 Hypertensive crisis, unspecified: Secondary | ICD-10-CM

## 2017-05-25 DIAGNOSIS — Z880 Allergy status to penicillin: Secondary | ICD-10-CM

## 2017-05-25 DIAGNOSIS — E8809 Other disorders of plasma-protein metabolism, not elsewhere classified: Secondary | ICD-10-CM | POA: Diagnosis present

## 2017-05-25 DIAGNOSIS — K219 Gastro-esophageal reflux disease without esophagitis: Secondary | ICD-10-CM | POA: Diagnosis present

## 2017-05-25 DIAGNOSIS — Z888 Allergy status to other drugs, medicaments and biological substances status: Secondary | ICD-10-CM | POA: Diagnosis not present

## 2017-05-25 DIAGNOSIS — D45 Polycythemia vera: Secondary | ICD-10-CM | POA: Diagnosis present

## 2017-05-25 DIAGNOSIS — I2510 Atherosclerotic heart disease of native coronary artery without angina pectoris: Secondary | ICD-10-CM | POA: Diagnosis present

## 2017-05-25 DIAGNOSIS — D62 Acute posthemorrhagic anemia: Secondary | ICD-10-CM | POA: Diagnosis present

## 2017-05-25 DIAGNOSIS — Z9841 Cataract extraction status, right eye: Secondary | ICD-10-CM | POA: Diagnosis not present

## 2017-05-25 DIAGNOSIS — E46 Unspecified protein-calorie malnutrition: Secondary | ICD-10-CM

## 2017-05-25 DIAGNOSIS — Z89611 Acquired absence of right leg above knee: Secondary | ICD-10-CM | POA: Diagnosis not present

## 2017-05-25 DIAGNOSIS — R269 Unspecified abnormalities of gait and mobility: Secondary | ICD-10-CM | POA: Diagnosis not present

## 2017-05-25 DIAGNOSIS — Z8673 Personal history of transient ischemic attack (TIA), and cerebral infarction without residual deficits: Secondary | ICD-10-CM | POA: Diagnosis not present

## 2017-05-25 DIAGNOSIS — I10 Essential (primary) hypertension: Secondary | ICD-10-CM | POA: Diagnosis present

## 2017-05-25 DIAGNOSIS — Z4781 Encounter for orthopedic aftercare following surgical amputation: Secondary | ICD-10-CM | POA: Diagnosis present

## 2017-05-25 DIAGNOSIS — Z885 Allergy status to narcotic agent status: Secondary | ICD-10-CM | POA: Diagnosis not present

## 2017-05-25 DIAGNOSIS — I480 Paroxysmal atrial fibrillation: Secondary | ICD-10-CM | POA: Diagnosis present

## 2017-05-25 DIAGNOSIS — Z961 Presence of intraocular lens: Secondary | ICD-10-CM | POA: Diagnosis present

## 2017-05-25 DIAGNOSIS — I252 Old myocardial infarction: Secondary | ICD-10-CM | POA: Diagnosis not present

## 2017-05-25 LAB — TYPE AND SCREEN
ABO/RH(D): A POS
Antibody Screen: NEGATIVE
Unit division: 0
Unit division: 0

## 2017-05-25 LAB — BPAM RBC
BLOOD PRODUCT EXPIRATION DATE: 201806132359
Blood Product Expiration Date: 201806132359
ISSUE DATE / TIME: 201805300757
ISSUE DATE / TIME: 201806011628
UNIT TYPE AND RH: 6200
Unit Type and Rh: 6200

## 2017-05-25 MED ORDER — POLYETHYLENE GLYCOL 3350 17 G PO PACK: 17.0000 g | PACK | Freq: Every day | ORAL | Status: DC | PRN

## 2017-05-25 MED ORDER — MORPHINE SULFATE (PF) 2 MG/ML IV SOLN: 1.0000 mg | INTRAVENOUS | Status: DC | PRN

## 2017-05-25 MED ORDER — SIMETHICONE 80 MG PO CHEW: 80.0000 mg | CHEWABLE_TABLET | Freq: Four times a day (QID) | ORAL | Status: DC | PRN

## 2017-05-25 MED ORDER — FLECAINIDE ACETATE 50 MG PO TABS
50.0000 mg | ORAL_TABLET | Freq: Two times a day (BID) | ORAL | Status: DC
  Administered 2017-05-25 – 2017-05-31 (×12): 50 mg via ORAL
  Filled 2017-05-25 (×14): qty 1

## 2017-05-25 MED ORDER — APIXABAN 5 MG PO TABS
5.0000 mg | ORAL_TABLET | Freq: Two times a day (BID) | ORAL | Status: DC
Start: 2017-05-25 — End: 2017-05-31
  Administered 2017-05-25 – 2017-05-31 (×12): 5 mg via ORAL
  Filled 2017-05-25 (×12): qty 1

## 2017-05-25 MED ORDER — POLYVINYL ALCOHOL 1.4 % OP SOLN: 1.0000 [drp] | Freq: Three times a day (TID) | OPHTHALMIC | Status: DC | PRN

## 2017-05-25 MED ORDER — TRAMADOL HCL 50 MG PO TABS
100.0000 mg | ORAL_TABLET | Freq: Four times a day (QID) | ORAL | Status: DC | PRN
  Administered 2017-05-25: 100 mg via ORAL
  Filled 2017-05-25 (×2): qty 2

## 2017-05-25 MED ORDER — EZETIMIBE 10 MG PO TABS
10.0000 mg | ORAL_TABLET | Freq: Every day | ORAL | Status: DC
  Administered 2017-05-25 – 2017-05-30 (×6): 10 mg via ORAL
  Filled 2017-05-25 (×6): qty 1

## 2017-05-25 MED ORDER — LISINOPRIL 20 MG PO TABS
20.0000 mg | ORAL_TABLET | Freq: Every day | ORAL | Status: DC
  Administered 2017-05-26 – 2017-05-27 (×2): 20 mg via ORAL
  Filled 2017-05-25 (×2): qty 1

## 2017-05-25 MED ORDER — ONDANSETRON HCL 4 MG PO TABS: 4.0000 mg | ORAL_TABLET | Freq: Four times a day (QID) | ORAL | Status: DC | PRN

## 2017-05-25 MED ORDER — BISACODYL 10 MG RE SUPP: 10.0000 mg | Freq: Every day | RECTAL | Status: DC | PRN

## 2017-05-25 MED ORDER — ONDANSETRON HCL 4 MG/2ML IJ SOLN: 4.0000 mg | Freq: Four times a day (QID) | INTRAMUSCULAR | Status: DC | PRN

## 2017-05-25 MED ORDER — ACETAMINOPHEN 650 MG RE SUPP: 325.0000 mg | Freq: Four times a day (QID) | RECTAL | Status: DC | PRN

## 2017-05-25 MED ORDER — DILTIAZEM HCL ER COATED BEADS 180 MG PO CP24
180.0000 mg | ORAL_CAPSULE | Freq: Every morning | ORAL | Status: DC
  Administered 2017-05-26 – 2017-05-30 (×5): 180 mg via ORAL
  Filled 2017-05-25 (×5): qty 1

## 2017-05-25 MED ORDER — ACETAMINOPHEN 325 MG PO TABS
325.0000 mg | ORAL_TABLET | Freq: Four times a day (QID) | ORAL | Status: DC | PRN
  Administered 2017-05-26 (×2): 650 mg via ORAL
  Filled 2017-05-25 (×2): qty 2

## 2017-05-25 MED ORDER — SORBITOL 70 % SOLN: 30.0000 mL | Freq: Every day | Status: DC | PRN

## 2017-05-25 MED ORDER — DOCUSATE SODIUM 100 MG PO CAPS
200.0000 mg | ORAL_CAPSULE | Freq: Every evening | ORAL | Status: DC
  Administered 2017-05-25 – 2017-05-30 (×6): 200 mg via ORAL
  Filled 2017-05-25 (×6): qty 2

## 2017-05-25 MED ORDER — ASPIRIN EC 325 MG PO TBEC
325.0000 mg | DELAYED_RELEASE_TABLET | Freq: Every day | ORAL | Status: DC
  Administered 2017-05-26 – 2017-05-31 (×6): 325 mg via ORAL
  Filled 2017-05-25 (×6): qty 1

## 2017-05-25 MED ORDER — PANTOPRAZOLE SODIUM 40 MG PO TBEC
40.0000 mg | DELAYED_RELEASE_TABLET | Freq: Every day | ORAL | Status: DC
  Administered 2017-05-26 – 2017-05-31 (×6): 40 mg via ORAL
  Filled 2017-05-25 (×6): qty 1

## 2017-05-25 NOTE — Care Management Note (Signed)
Case Management Note Previous CM note initiated by Zenon Mayo, RN--05/18/2017, 4:40 PM    Patient Details  Name: [name] MRN: [id number] Date of Birth: 1951-01-20  Subjective/Objective:    From home with her granddaughter, pod  1  Right femoral to tibioperoneal trunk bypass, per pt/ot eval rec HHPT/HHOT,  Patient chose [facility]  From list, NCM made referral to [facility] for [place], OT.  Soc will begin 24-48 hrs post dc.  Has [employer] and Medicaid.   PCP is  [facility] Housekeeper           Action/Plan: NCM will follow for dc needs.  Expected Discharge Date:  05/25/17               Expected Discharge Plan:  [place]  In-House Referral:     Discharge planning Services  CM Consult  Post Acute Care Choice:  NA Choice offered to:  Patient  DME Arranged:    DME Agency:     HH Arranged:  RN, PT, OT HH Agency:  [facility]  Status of Service:  Completed, signed off  If discussed at [place] of Stay Meetings, dates discussed:  6/5  Discharge Disposition: IP rehab (CIR)   Additional Comments:  05/25/17- 1030- Loran Fleet RN, CM- bipass failed- pt s/p AKA on 6/1- CIR consulted- per Pamala Hurry with CIR they have insurance auth today and bed available- plan will be to d/c to CIR later this afternoon.   Dahlia Client [place], RN 05/25/2017, 10:33 AM [phone]

## 2017-05-25 NOTE — Progress Notes (Signed)
Jamse Arn, MD Physician Signed Physical Medicine and Rehabilitation  Consult Note Date of Service: 05/24/2017  6:04 AM  Related encounter: ED to Hosp-Admission (Current) from 05/17/2017 in [facility] copied text    Hover for attribution information                                                                                                                                    untitled image              Physical Medicine and Rehabilitation Consult  Reason for Consult: Decreased functional mobility  Referring Physician: Dr. Donnetta Hutching        HPI: [name] is a 66 y.o. right handed female with history of ?polycythemia vera, remote tobacco abuse, CAD/NSTEMI, hypertension, atrial fibrillation maintained on Eliquis, peripheral vascular disease with multiple revascularization procedures. Per chart review and patient,patient lives with 56 year old granddaughter. Independent with a walker prior to admission. Granddaughter has just completed school and can assist. Patient also has a local elderly sister. One level home with 4 steps to entry. Presented 05/20/2017 with right lower extremity ischemic changes and rest pain that progressed over the past week. Underwent right femoral to tibioperoneal trunk bypass 05/17/2017 per Dr. Donnetta Hutching. Postoperative pain management with ongoing progressive ischemic changes and limb was not felt to be salvaged. Underwent right AKA 05/21/2017 per Dr. Donnetta Hutching.Eliquis resume postoperatively. Mildly elevated troponin 0.2 for follow-up conservatively by cardiology services. Await postoperative follow-up physical and occupational therapy evaluations. M.D. has requested physical medicine rehabilitation  consult.        Review of Systems   Constitutional: Negative for chills and fever.   HENT: Negative for hearing loss.    Eyes: Negative for blurred vision.   Respiratory: Positive for shortness of breath. Negative for cough.    Cardiovascular: Negative for chest pain.   Gastrointestinal: Positive for constipation. Negative for nausea and vomiting.        GERD   Genitourinary: Negative for dysuria, flank pain and hematuria.   Musculoskeletal: Positive for joint pain and myalgias.   Skin: Negative for rash.   Neurological: Positive for weakness and headaches. Negative for seizures.   All other systems reviewed and are negative.          Past Medical History:    Diagnosis   Date    .   Arthritis            "fingers, some in my knees" (01/28/2016)    .   CAD (coronary artery disease), per cath 04/27/13, non obstructive disease, EF 65-70%   05/11/2013    .   Carotid disease, bilateral ([place])  09/2013        Lt carotid 50-69% stentosis, less on rt    .   Dysrhythmia        .   Fatty liver        .   GERD (gastroesophageal reflux disease)        .   Glaucoma        .   H/O cardiovascular stress test   12/27/2009        negative for ischemia    .   Headache            "occasional since eye pressure regulated" (01/28/2016)    .   Hyperlipidemia        .   Hypertension        .   Leukocytosis   07/15/2015    .   Migraine            "stopped w/laser holes to relieve pressure in my eyes; had them 3-4 times/wk before taht" (01/28/2016)    .   Mitral regurgitation,2+ by cath   05/11/2013    .   NSTEMI (non-ST elevated myocardial infarction) ([place])   04/27/2013    .   Pain            BOTH KNEES - PT HAS TORN MENISCUS LEFT KNEE    .   Paroxysmal atrial fibrillation (HCC)            hx of a fib on ASA  AND ELIQUIS     .   Polycythemia vera ([place])    08/20/2015        JAK-2 positive 07/19/15    .   PVD (peripheral vascular disease) with claudication ([place])   07/2006        previous L ext iliac stent and rt SFA stent, known occluded Lt SFA    .   S/P cardiac cath   04/27/13        NON OBSTRUCTIVE DISEASE, 2+ mr, ef 65-70%    .   Statin intolerance        .   Stroke ([place])   2006        SWELLING ALL OVER AND RT SIDE OF FACE DRAWN AND SPEECH SLURRED AND NUMBNESS ON RIGHT SIDE-- ALL RESOLVED             Past Surgical History:    Procedure   Laterality   Date    .   AMPUTATION   Right   05/21/2017        Procedure: AMPUTATION ABOVE KNEE;  Surgeon: Rosetta Posner, MD;  Location: [facility];  Service: Vascular;  Laterality: Right;    .   CARDIAC CATHETERIZATION       04/27/13        non occlusive disease with mild to mod. calcified lesions in ostial LM and proximal LAD and moderate prox. RC AND DISTAL AV GROOVE LCX, ef    .   CATARACT EXTRACTION W/PHACO   Right   03/21/2015        Procedure: CATARACT EXTRACTION PHACO AND INTRAOCULAR LENS PLACEMENT RIGHT EYE;  Surgeon: Baruch Goldmann, MD;  [facility];  Service: Ophthalmology;  Laterality: Right;  CDE:5.10    .   CATARACT EXTRACTION W/PHACO   Left   05/16/2015        Procedure: CATARACT EXTRACTION PHACO AND INTRAOCULAR LENS PLACEMENT (IOC);  Surgeon: Baruch Goldmann, MD;  Location: [facility];  Service: Ophthalmology;  Laterality: Left;  CDE 5.57    .   [place]    .   COLONOSCOPY   N/A   09/11/2016        Procedure: COLONOSCOPY;  Surgeon: Rogene Houston, MD;  [facility];  Service: Endoscopy;  Laterality: N/A;  730-moved to 1:00 Ann notified pt    .   EMBOLECTOMY   Right   02/01/2016        Procedure: Thrombectomy of Right Femoral-Popliteal Bypass Graft; Endarterectomy of Right Below Knee Popliteal Artery and Tibial Peroneal Trunk with Bovine Pericardium  Patch Angioplasty.;  Surgeon: Angelia Mould, MD;  Location: [facility];  Service: Vascular;  Laterality: Right;    .   FEMORAL-POPLITEAL BYPASS GRAFT   Right   01/31/2016        Procedure: BYPASS GRAFT RIGHT COMMON  FEMORALTO BELOW KNEE POPLITEAL ARTERY BYPASS GRAFT USING 6MM PROPATEN GORTEX GRAFT;  Surgeon: Mal Misty, MD;  Location: [facility];  Service: Vascular;  Laterality: Right;    .   FEMORAL-POPLITEAL BYPASS GRAFT   Right   01/09/2017        Procedure: THROMBECTOMY OF RIGHT FEMORAL-POPLITEAL  ARTERY BYPASS GRAFT; THROMBECTOMY RIGHT  TIBIAL VESSELS;  Surgeon: [name], MD;  Location: [facility];  Service: Vascular;  Laterality: Right;    .   FEMORAL-POPLITEAL BYPASS GRAFT   Right   05/17/2017        Procedure: RIGHT FEMORAL-TIBIAL PERONEAL TRUNK ARTERY BYPASS GRAFT USING 36mmX80cm PROPATEN GRAFT WITH REMOVABLE RING;  Surgeon: Rosetta Posner, MD;  Location: [place];  Service: Vascular;  Laterality: Right;    .   FRACTURE SURGERY            .   ILIAC ARTERY STENT       07/2006        external iliac and Rt SFA stent 07/2006 AND THE LEFT WAS IN 2006    .   KNEE ARTHROSCOPY WITH MEDIAL MENISECTOMY   Left   03/27/2014        Procedure: left knee arthorscopy with medial chondraplasty of the medial femoral and patella, medial microfracture technique of medial femoral condyl;  Surgeon: Tobi Bastos, MD;  Location: [facility];  Service: Orthopedics;  Laterality: Left;    .   LEFT HEART CATHETERIZATION WITH CORONARY ANGIOGRAM   Right   04/27/2013        Procedure: LEFT HEART CATHETERIZATION WITH CORONARY ANGIOGRAM;  Surgeon: Leonie Man, MD;  Location: [facility];  Service: Cardiovascular;  Laterality: Right;    .   LOWER EXTREMITY ANGIOGRAM   Right   01/31/2016        Procedure: INTRAOP RIGHT LOWER EXTREMITY ANGIOGRAM;  Surgeon: Mal Misty, MD;  Location: [facility];  Service: Vascular;  Laterality: Right;    .    ORIF WRIST FRACTURE   Right   1983    .   OVARIAN CYST REMOVAL   Right        .   PATCH ANGIOPLASTY   Right   01/31/2016        Procedure: RIGHT COMMON FEMORAL AND PROFUNDA FEMORIS ENDARECTOMY WITH PATCH ANGIOPLASTY;  Surgeon: Mal Misty, MD;  Location: [facility];  Service: Vascular;  Laterality: Right;    .   PATCH ANGIOPLASTY   Right   01/09/2017        Procedure: PATCH ANGIOPLASTY RIGHT POPLITEAL ARTERY BYPASS  GRAFT;  Surgeon: [name], MD;  Location: [facility];  Service: Vascular;  Laterality: Right;    .   PERIPHERAL VASCULAR CATHETERIZATION   N/A   06/27/2015        Procedure: Lower Extremity Angiography;  Surgeon: Lorretta Harp, MD;  Location: [facility];  Service: Cardiovascular;  Laterality: N/A;    .   PERIPHERAL VASCULAR CATHETERIZATION   Bilateral   01/29/2016        Procedure: Lower Extremity Angiography;  Surgeon: Wellington Hampshire, MD;  Location: [facility];  Service: Cardiovascular;  Laterality: Bilateral;    .   PERIPHERAL VASCULAR CATHETERIZATION   N/A   01/29/2016        Procedure: Abdominal Aortogram;  Surgeon: Wellington Hampshire, MD;  Location: [facility];  Service: Cardiovascular;  Laterality: N/A;    .   REFRACTIVE SURGERY   Bilateral            "6 in one eye; 7 in the other; to relieve pressure; not glaucoma" (01/28/2016)    .   SFA   Right   06/27/2015        overlapping Bahn covered stents    .   TUBAL LIGATION       1983    .   VEIN HARVEST   Left   05/17/2017        Procedure: LEFT LEG GREATER SAPHENOUS VEIN HARVEST;  Surgeon: Rosetta Posner, MD;  Location: [facility];  Service: Vascular;  Laterality: Left;    Marland Kitchen   VEIN REPAIR   Right   01/31/2016        Procedure: RIGHT GREATER SAPHENOUS VEIN EXAMNED BUT NOT REMOVED;  Surgeon: Mal Misty, MD;  Location: [facility];  Service: Vascular;  Laterality: Right;             Family  History    Problem   Relation   Age of Onset    .   CAD   Mother        .   Lung cancer   Mother        .   Heart attack   Mother        .   CAD   Father        .   Heart disease   Father        .   Heart attack   Father   70            died in hes 66's with heart disease    .   Healthy   Sister        .   Liver cancer   Brother        .   Healthy   Sister        .   CAD   Brother                has had CABG    .   CAD   Brother           Social History:  reports that she quit smoking about 4 years ago. Her smoking use included Cigarettes. She has a 45.00 pack-year smoking history. She has never used smokeless tobacco. She reports that she does not drink alcohol or use drugs.  Allergies:         Allergies    Allergen   Reactions    .  Atenolol   Rash and Itching    .   Clopidogrel   Itching    .   Demerol [Meperidine]   Other (See Comments) and Itching            Unknown, can't remember   Unknown, can't remember     .   Inderal [Propranolol]   Other (See Comments) and Itching            Doesn't remember     .   Oxycodone-Acetaminophen   Itching    .   Prednisone   Other (See Comments)            Muscle spasms    .   Statins   Itching and Other (See Comments)            Simvastatin-caused severe itching  Pravastatin-caused lesser tiching  One type of statin? Caused stroke in 2006, and other symptoms as result    .   Venlafaxine   Itching    .   Warfarin And Related   Other (See Comments)            Caused nose bleeds    .   Crestor [Rosuvastatin]   Itching and Rash    .   Esomeprazole   Rash    .   Lactose Intolerance (Gi)   Other (See Comments)            Bloating, gas    .   Lipitor [Atorvastatin]   Rash    .   Lovaza [Omega-3-Acid Ethyl  Esters]   Other (See Comments)            nosebleeds  Doesn't remember     .   Penicillins   Hives, Other (See Comments) and Rash            Questionable high fever when taken and broke out in whelps.     .   Pravastatin   Itching and Rash    .   Simvastatin   Itching, Rash and Other (See Comments)            Also lipitor intolerant    .   Trilipix [Choline Fenofibrate]   Other (See Comments)            Doesn't remember     .   Warfarin   Other (See Comments)            nosebleeds    .   Effexor [Venlafaxine Hcl]   Other (See Comments)            Doesn't remember     .   Nexium [Esomeprazole Magnesium]   Rash    .   Percocet [Oxycodone-Acetaminophen]   Other (See Comments)            Doesn't remember     .   Plavix [Clopidogrel Bisulfate]   Rash              Medications Prior to Admission    Medication   Sig   Dispense   Refill    .   acetaminophen (TYLENOL) 500 MG tablet   Take 1,000 mg by mouth every 6 (six) hours as needed for moderate pain.             Marland Kitchen   apixaban (ELIQUIS) 5 MG TABS tablet   Take 1 tablet (5 mg total) by mouth 2 (two) times daily.   60 tablet   1    .  ARTIFICIAL TEAR OP   Place 1 drop into both eyes as needed (for dry eyes).            Marland Kitchen   aspirin EC 325 MG tablet   Take 325 mg by mouth daily.            Marland Kitchen   diltiazem (CARDIZEM CD) 180 MG 24 hr capsule   Take 180 mg by mouth every morning.            .   docusate sodium (COLACE) 100 MG capsule   Take 200 mg by mouth every evening.             .   ezetimibe (ZETIA) 10 MG tablet   Take 1 tablet (10 mg total) by mouth at bedtime. PLEASE CONTACT OFFICE FOR ADDITIONAL REFILLS   7 tablet   0    .   fenofibrate (TRICOR) 145 MG tablet   Take 1 tablet (145 mg total) by mouth at bedtime. PLEASE CONTACT OFFICE FOR ADDITIONAL REFILLS   7  tablet   0    .   ferrous sulfate 325 (65 FE) MG tablet   Take 325 mg by mouth every evening.            .   flecainide (TAMBOCOR) 50 MG tablet   Take 1 tablet (50 mg total) by mouth 2 (two) times daily. PLEASE CONTACT OFFICE FOR ADDITIONAL REFILLS   15 tablet   0    .   lisinopril (PRINIVIL,ZESTRIL) 20 MG tablet   Take 20 mg by mouth daily.            Marland Kitchen   omeprazole (PRILOSEC) 20 MG capsule   Take 20 mg by mouth 2 (two) times daily before a meal.             .   traMADol (ULTRAM) 50 MG tablet   Take 1 tablet (50 mg total) by mouth every 6 (six) hours as needed. Currently: taking med between 2-3 times daily for pain (Patient taking differently: Take 100 mg by mouth 3 (three) times daily. Currently: taking med between 2-3 times daily for pain)   20 tablet   0          Home:  Home Living  Family/patient expects to be discharged to:: Private residence  Living Arrangements: Other relatives  Available Help at Discharge: Family, Friend(s), Available PRN/intermittently  Type of Home: House  Home Access: Stairs to enter  Technical brewer of Steps: 4  Entrance Stairs-Rails: Right, Left  Home Layout: One level  Bathroom Shower/Tub: Administrator, Civil Service: Standard  Home Equipment: Environmental consultant - 2 wheels, Bedside commode, Shower seat, Grab bars - tub/shower, Hand held shower head, Adaptive equipment  Adaptive Equipment: Reacher  Additional Comments: pt uses BSC in tub shower and states she can sit on BSC and then swing legs into tub from there.    Functional History:  Prior Function  Level of Independence: Independent  ADL's / Homemaking Assistance Needed: has a neighbor that is a CNA that can (A) if she needs to call her. Her name is "wanda"  Comments: has 2 grandchildren that can help her after they get out of school  Functional Status:   Mobility:  Bed Mobility  Overal bed mobility: Needs  Assistance  Bed Mobility: Supine to Sit, Sit to Supine  Supine to sit: Min assist, HOB elevated  Sit to supine: Min assist, HOB elevated  General bed mobility comments: Patient  required increased time, stopping frequently due to RLE pain.  Min assist to steady during transitions.  Transfers  Overall transfer level: Needs assistance  Equipment used: Rolling walker (2 wheeled)  Transfers: Sit to/from Stand, [employer] Transfers  Sit to Stand: Mod assist  Stand pivot transfers: Min assist  General transfer comment: Verbal cues for hand placement.  Mod assist to power up onto LLE.  Min assist for balance in stance.  Cues to let RLE rest downward in neutral position.  Patient able to take several hop steps with RW and min-mod assist to transfer bed <> BSC.  Ambulation/Gait  Ambulation/Gait assistance: Min guard, Supervision  Ambulation Distance (Feet): 25 Feet  Assistive device: Rolling walker (2 wheeled)  Gait Pattern/deviations: Step-to pattern, Decreased step length - left, Decreased stance time - right, Antalgic  General Gait Details: Patient declined today due to pain.       ADL:  ADL  Overall ADL's : Needs assistance/impaired  Eating/Feeding: Independent  Grooming: Wash/dry hands, Standing, Supervision/safety  Upper Body Bathing: Independent  Lower Body Bathing: Maximal assistance  Lower Body Bathing Details (indicate cue type and reason): recommended long bath sponge  Upper Body Dressing : Independent  Lower Body Dressing: Maximal assistance  Lower Body Dressing Details (indicate cue type and reason): educated in use of her reacher and compensatory strategies for LB dressing, pt usually avoids socks or pulls her leg up onto the bed, pt not interested in sock aide  Toilet Transfer: Supervision/safety, RW, BSC, Ambulation  Toileting- Clothing Manipulation and Hygiene: Supervision/safety, Sit to/from stand  Functional mobility during ADLs:  Supervision/safety, Rolling walker  General ADL Comments: Pt has had this type of sx 3 times, most recently in January. She is knowledgeble in uses of 3 in 1 and has no concerns about returning home with intermittent assist of her granddaughter.     Cognition:  Cognition  Overall Cognitive Status: Within Functional Limits for tasks assessed  Orientation Level: Oriented X4  Cognition  Arousal/Alertness: Awake/alert  Behavior During Therapy: WFL for tasks assessed/performed, Anxious  Overall Cognitive Status: Within Functional Limits for tasks assessed     Blood pressure (!) 138/46, pulse 72, temperature 97.6 F (36.4 C), temperature source Oral, resp. rate 18, height 5\' 5"  (1.651 m), weight 66.7 kg (147 lb), SpO2 100 %.  Physical Exam   Vitals reviewed.  Constitutional: She is oriented to person, place, and time. She appears well-developed and well-nourished.   HENT:   Head: Normocephalic and atraumatic.   Eyes: EOM are normal. Right eye exhibits no discharge. Left eye exhibits no discharge.   Neck: Normal range of motion. Neck supple. No thyromegaly present.   Cardiovascular: Normal rate and regular rhythm.    Respiratory: Effort normal and breath sounds normal. No respiratory distress.   GI: Soft. Bowel sounds are normal. She exhibits no distension.  Musculoskeletal: She exhibits edema and tenderness.  Neurological: She is alert and oriented to person, place, and time.  Motor: B/l UE 5/5 proximal to distal LLE: 4+/5 (pain inhibition) RLE: HF 4/5 (pain inhibition)   Skin:  Amputation site is dressed appropriately tender  Psychiatric: She has a normal mood and affect. Her behavior is normal.         Lab Results Last 24 Hours  Imaging Results (Last 48 hours)          Assessment/Plan:  Diagnosis: right AKA  Labs independently reviewed.  Records reviewed and summated above.  Clean amputation daily with soap and water  Monitor incision site for signs of infection or impending skin breakdown.  Staples to remain in place for 3-4 weeks  Stump shrinker, for edema control   Scar mobilization massaging to prevent soft tissue adherence Stump protector during therapies  Prevent flexion contractures by implementing the following:               Encourage prone lying for 20-30 mins per day BID to avoid hip flexion       Contractures if medically appropriate;              Avoid pillow under knees when patient is lying in bed in order to prevent both        knee and hip flexion contractures;              Avoid prolonged sitting  Post surgical pain control with oral medication  Phantom limb pain control with physical modalities including desensitization techniques (gentle self massage to the residual stump,hot packs if sensation intact, [place]) and mirror therapy, TENS. If ineffective, consider pharmacological treatment for neuropathic pain (e.g gabapentin, pregabalin, amytriptalyine, duloxetine).   When using wheelchair, patient should have knee on amputated side fully extended with board under the seat cushion.  Avoid injury to contralateral side     1.Does the need for close, 24 hr/day medical supervision in concert with the patient's rehab needs make it unreasonable for this patient to be served in a less intensive setting? Yes   2.Co-Morbidities requiring supervision/potential complications: elevated troponin (monitoring), peripheral vascular disease (cont meds), polycythemia vera, remote tobacco abuse,  CAD/NSTEMI (cont meds), HTN (monitor and provide prns in accordance with increased physical exertion and pain), atrial fibrillation (cont meds, monitor HR with increased physical activity), ABLA (transfuse if necessary to ensure appropriate perfusion for increased activity tolerance), leukocytosis (cont to monitor for signs and symptoms of infection, further workup if indicated)   3.Due to safety, skin/wound care, disease management, pain management and patient education, does the patient require 24 hr/day rehab nursing? Yes   4.Does the patient require coordinated care of a physician, rehab nurse, PT (1-2 hrs/day, 5 days/week) and OT (1-2 hrs/day, 5 days/week) to address physical and functional deficits in the context of the above medical diagnosis(es)? Yes Addressing deficits in the following areas: balance, endurance, locomotion, strength, transferring, bathing, dressing, toileting and psychosocial support   5.Can the patient actively participate in an intensive therapy program of at least 3 hrs of therapy per day at least 5 days per week? Likely soon   6.The potential for patient to make measurable gains while on inpatient rehab is excellent   7.Anticipated functional outcomes upon discharge from inpatient rehab are supervision  with PT, modified independent and supervision with OT, n/a with SLP.   8.Estimated rehab length of stay to reach the above functional goals is: 7-11 days.   9.Anticipated D/C setting: Home   10.Anticipated post D/C treatments: HH therapy and Home excercise program   11.Overall Rehab/Functional Prognosis: good      RECOMMENDATIONS:  This patient's condition is appropriate for continued rehabilitative care in the following setting: CIR likely in 1-2 days.  Patient has agreed to participate in recommended program. Yes  Note that insurance prior authorization may be required for reimbursement for recommended care.  Comment: Rehab Admissions  Coordinator to follow up.     Delice Lesch, MD, Mellody Drown  Cathlyn Parsons., PA-C  05/24/2017         Revision History                                   Routing History

## 2017-05-25 NOTE — Progress Notes (Signed)
Patient admitted to unit about 1510. Patient alert and oriented to unit. Resource notebook given and all belongings left within reach.

## 2017-05-25 NOTE — Progress Notes (Addendum)
  [facility] Progress Note  Subjective    Had some issues with pain control last night.   Objective Vitals:   05/24/17 2119 05/25/17 0400  BP: (!) 151/74 (!) 176/62  Pulse: 72 67  Resp: 18 18  Temp: 98.5 F (36.9 C) 98.6 F (37 C)    Intake/Output Summary (Last 24 hours) at 05/25/17 0750 Last data filed at 05/25/17 0401  Gross per 24 hour  Intake              240 ml  Output              700 ml  Net             -460 ml   Right AKA staple line clean and intact.  Right groin and left thigh saphenectomy sites clean  Assessment/Planning: 66 y.o. female is s/p: right AKA 4 Days Post-Op   Amputation site looks good. Awaiting insurance approval for CIR.  Ok to d/c to CIR today if approved.   Alvia Grove 05/25/2017 7:50 AM --  Laboratory CBC    Component Value Date/Time   WBC 15.5 (H) 05/24/2017 0522   HGB 8.3 (L) 05/24/2017 0522   HCT 29.2 (L) 05/24/2017 0522   HCT 30.6 (L) 03/09/2017 1200   PLT 353 05/24/2017 0522   PLT 429 (H) 03/09/2017 1200    BMET    Component Value Date/Time   NA 137 05/21/2017 0419   K 3.9 05/21/2017 0419   CL 100 (L) 05/21/2017 0419   CO2 27 05/21/2017 0419   GLUCOSE 106 (H) 05/21/2017 0419   BUN 8 05/21/2017 0419   CREATININE 0.72 05/21/2017 0419   CREATININE 1.33 (H) 03/03/2016 1531   CALCIUM 8.4 (L) 05/21/2017 0419   GFRNONAA >60 05/21/2017 0419   GFRAA >60 05/21/2017 0419    COAG Lab Results  Component Value Date   INR 1.19 05/17/2017   INR 1.07 01/09/2017   INR 1.09 01/31/2016   No results found for: PTT  Antibiotics Anti-infectives    Start     Dose/Rate Route Frequency Ordered Stop   05/21/17 0600  vancomycin (VANCOCIN) IVPB 1000 mg/200 mL premix     1,000 mg 200 mL/hr over 60 Minutes Intravenous To Surgery 05/20/17 0756 05/21/17 1710   05/17/17 1900  vancomycin (VANCOCIN) IVPB 1000 mg/200 mL premix     1,000 mg 200 mL/hr over 60 Minutes Intravenous Every 12 hours 05/17/17 1721 05/18/17  0719       Virgina Jock, PA-C [facility] Office: [phone] Pager: [phone] 05/25/2017 7:50 AM  I have examined the patient, reviewed and agree with above.  Curt Jews, MD 05/25/2017 8:24 AM

## 2017-05-25 NOTE — Progress Notes (Signed)
Stacey Gong, RN Rehab Admission Coordinator Signed Physical Medicine and Rehabilitation  PMR Pre-admission Date of Service: 05/24/2017 3:48 PM  Related encounter: ED to Hosp-Admission (Current) from 05/17/2017 in [place]       [] Hide copied text PMR Admission Coordinator Pre-Admission Assessment  Patient: [name] is an 66 y.o., female MRN: [id number] DOB: 02-26-1951 Height: 5\' 5"  (165.1 cm) Weight: 66.7 kg (147 lb)                                                                                                                                                  Insurance Information HMO: yes    PPO:      PCP:     IPA:      80/20:      OTHER: medicare advantage plan PRIMARY: [employer] Medicare      Policy#: [id number]      Subscriber: pt CM Name: Orvan July      Phone#: [phone]     Fax#: [phone] Pre-Cert#: [id number] approved for 7 days with f/u CM Vevelyn Royals phone [phone] fax [phone]      Employer: retired Benefits:  Phone #: online     Name: 05/24/17 Eff. Date: 12/21/16     Deduct: none      Out of Pocket Max: [phone]      Life Max: none CIR: no co pay days 1-90      SNF: no co pay days 1-20; $167.50 co pay days 21-100; no co pay with Medicaid [place] Outpatient: no cp ay      Co-Pay: visits per medical neccesity Home Health: 100%      Co-Pay: visits per medical neccesity DME: 80%     Co-Pay: 20% Providers: in metalwork  SECONDARY: Medicaid [place] Access      Policy#: [id number]      Subscriber: pt  Medicaid Application Date:       Case Manager:  Disability Application Date:       Case Worker:   Emergency Contact Information        Contact Information    Name Relation Home Work Mobile   [place] Sister [phone]  [phone]   Adams,Peggy Sister [phone]  [phone]   Linus Galas Daughter   [phone]     Current Medical History  Patient Admitting Diagnosis:  Right AKA  History of  Present Illness:HPI: Stacey B Mosleyis a 66 y.o.right handed femalewith history of ?polycythemia vera,remote tobacco abuse,CAD/NSTEMI, hypertension, atrial fibrillation maintained on Eliquis, peripheral vascular disease with multiple revascularization procedures.Presented 05/20/2017 with right lower extremity ischemic changes and rest pain that progressed over the past week. Underwent right femoral to tibioperoneal trunk bypass 05/17/2017 per Dr. Donnetta Hutching. Postoperative pain management with ongoing progressive ischemic changes and limb was not felt to be salvaged. Underwent right AKA 05/21/2017 per Dr. Donnetta Hutching.Eliquisresume  postoperatively. Mildly elevated troponin 0.2 for follow-up conservatively by cardiology services.  Past Medical History      Past Medical History:  Diagnosis Date  . Arthritis    "fingers, some in my knees" (01/28/2016)  . CAD (coronary artery disease), per cath 04/27/13, non obstructive disease, EF 65-70% 05/11/2013  . Carotid disease, bilateral ([facility]) 09/2013   Lt carotid 50-69% stentosis, less on rt  . Dysrhythmia   . Fatty liver   . GERD (gastroesophageal reflux disease)   . Glaucoma   . H/O cardiovascular stress test 12/27/2009   negative for ischemia  . Headache    "occasional since eye pressure regulated" (01/28/2016)  . Hyperlipidemia   . Hypertension   . Leukocytosis 07/15/2015  . Migraine    "stopped w/laser holes to relieve pressure in my eyes; had them 3-4 times/wk before taht" (01/28/2016)  . Mitral regurgitation,2+ by cath 05/11/2013  . NSTEMI (non-ST elevated myocardial infarction) ([facility]) 04/27/2013  . Pain    BOTH KNEES - PT HAS TORN MENISCUS LEFT KNEE  . Paroxysmal atrial fibrillation (HCC)    hx of a fib on ASA  AND ELIQUIS   . Polycythemia vera ([facility]) 08/20/2015   JAK-2 positive 07/19/15  . PVD (peripheral vascular disease) with claudication ([facility]) 07/2006   previous L ext iliac stent and rt SFA stent, known occluded Lt SFA  . S/P cardiac  cath 04/27/13   NON OBSTRUCTIVE DISEASE, 2+ mr, ef 65-70%  . Statin intolerance   . Stroke ([facility]) 2006   SWELLING ALL OVER AND RT SIDE OF FACE DRAWN AND SPEECH SLURRED AND NUMBNESS ON RIGHT SIDE-- ALL RESOLVED    Family History  family history includes CAD in her brother, brother, father, and mother; Healthy in her sister and sister; Heart attack in her mother; Heart attack (age of onset: 65) in her father; Heart disease in her father; Liver cancer in her brother; Lung cancer in her mother.  Prior Rehab/Hospitalizations:  Has the patient had major surgery during 100 days prior to admission? Yes  Current Medications   Current Facility-Administered Medications:  .  acetaminophen (TYLENOL) tablet 325-650 mg, 325-650 mg, Oral, Q6H PRN, 650 mg at 05/25/17 0706 **OR** acetaminophen (TYLENOL) suppository 325-650 mg, 325-650 mg, Rectal, Q6H PRN, Rhyne, Samantha J, PA-C .  alum & mag hydroxide-simeth (MAALOX/MYLANTA) 200-200-20 MG/5ML suspension 15-30 mL, 15-30 mL, Oral, Q2H PRN, Rhyne, Samantha J, PA-C .  apixaban (ELIQUIS) tablet 5 mg, 5 mg, Oral, BID, [name], MD, 5 mg at 05/24/17 2142 .  aspirin EC tablet 325 mg, 325 mg, Oral, Daily, [name], MD, 325 mg at 05/24/17 0954 .  bisacodyl (DULCOLAX) suppository 10 mg, 10 mg, Rectal, Daily PRN, Rhyne, Samantha J, PA-C .  diltiazem (CARDIZEM CD) 24 hr capsule 180 mg, 180 mg, Oral, q morning - 10a, Rhyne, Samantha J, PA-C, 180 mg at 05/24/17 0955 .  docusate sodium (COLACE) capsule 200 mg, 200 mg, Oral, QPM, Rhyne, Samantha J, PA-C, 200 mg at 05/24/17 1746 .  ezetimibe (ZETIA) tablet 10 mg, 10 mg, Oral, QHS, Rhyne, Samantha J, PA-C, 10 mg at 05/24/17 2142 .  flecainide (TAMBOCOR) tablet 50 mg, 50 mg, Oral, BID, Rhyne, Samantha J, PA-C, 50 mg at 05/24/17 2142 .  guaiFENesin-dextromethorphan (ROBITUSSIN DM) 100-10 MG/5ML syrup 15 mL, 15 mL, Oral, Q4H PRN, Rhyne, Samantha J, PA-C .  hydrALAZINE (APRESOLINE) injection 5 mg, 5 mg,  Intravenous, Q20 Min PRN, Rhyne, Samantha J, PA-C, 5 mg at 05/23/17 0607 .  lisinopril (PRINIVIL,ZESTRIL)  tablet 20 mg, 20 mg, Oral, Daily, Early, Arvilla Meres, MD, 20 mg at 05/24/17 0954 .  magnesium sulfate IVPB 2 g 50 mL, 2 g, Intravenous, Daily PRN, Rhyne, Samantha J, PA-C .  metoprolol tartrate (LOPRESSOR) injection 2-5 mg, 2-5 mg, Intravenous, Q2H PRN, Rhyne, Samantha J, PA-C .  morphine 2 MG/ML injection 1-3 mg, 1-3 mg, Intravenous, Q1H PRN, Trinh, Kimberly A, PA-C .  ondansetron (ZOFRAN) injection 4 mg, 4 mg, Intravenous, Q6H PRN, Rhyne, Samantha J, PA-C, 4 mg at 05/21/17 1700 .  pantoprazole (PROTONIX) EC tablet 40 mg, 40 mg, Oral, Daily, Rhyne, Samantha J, PA-C, 40 mg at 05/24/17 0954 .  phenol (CHLORASEPTIC) mouth spray 1 spray, 1 spray, Mouth/Throat, PRN, Rhyne, Samantha J, PA-C .  polyethylene glycol (MIRALAX / GLYCOLAX) packet 17 g, 17 g, Oral, Daily PRN, Rhyne, Samantha J, PA-C .  polyvinyl alcohol (LIQUIFILM TEARS) 1.4 % ophthalmic solution 1 drop, 1 drop, Both Eyes, TID PRN, Rhyne, Samantha J, PA-C .  potassium chloride SA (K-DUR,KLOR-CON) CR tablet 20-40 mEq, 20-40 mEq, Oral, Daily PRN, Rhyne, Samantha J, PA-C .  simethicone (MYLICON) chewable tablet 80 mg, 80 mg, Oral, QID PRN, [name], MD .  traMADol Veatrice Bourbon) tablet 100 mg, 100 mg, Oral, Q6H PRN, Rhyne, Samantha J, PA-C, 100 mg at 05/25/17 0355  Patients Current Diet: Diet Heart Room service appropriate? Yes; Fluid consistency: Thin  Precautions / Restrictions Precautions Precautions: Fall Precaution Comments: watch BP Restrictions Weight Bearing Restrictions: Yes RLE Weight Bearing: Non weight bearing   Has the patient had 2 or more falls or a fall with injury in the past year?No  Prior Activity Level Community (5-7x/wk): Independent pta; drove; like to keep active   Pt cared for husband at home for 9 years with a CVA and an amputation. Pt is a retired [employer] manager.  Home Assistive Devices / Equipment Home  Assistive Devices/Equipment: Cane (specify quad or straight), Bedside commode/3-in-1 Home Equipment: Walker - 2 wheels, Bedside commode, Shower seat, Grab bars - tub/shower, Hand held shower head, Adaptive equipment  Prior Device Use: Indicate devices/aids used by the patient prior to current illness, exacerbation or injury? Walker  Prior Functional Level Prior Function Level of Independence: Independent ADL's / Homemaking Assistance Needed: has a neighbor that is a CNA that can (A) if she needs to call her. Her name is "wanda" Comments: pt can return to her own home, or go stay with her sister if needed  Self Care: Did the patient need help bathing, dressing, using the toilet or eating?  Independent  Indoor Mobility: Did the patient need assistance with walking from room to room (with or without device)? Independent  Stairs: Did the patient need assistance with internal or external stairs (with or without device)? Independent  Functional Cognition: Did the patient need help planning regular tasks such as shopping or remembering to take medications? Independent  Current Functional Level Cognition  Overall Cognitive Status: Within Functional Limits for tasks assessed Orientation Level: Oriented X4    Extremity Assessment (includes Sensation/Coordination)  Upper Extremity Assessment: Defer to OT evaluation  Lower Extremity Assessment: RLE deficits/detail RLE Deficits / Details: New AKA.  Able to flex hip against gravity.  Patient reports she still feels her Rt foot. RLE: Unable to fully assess due to pain    ADLs  Overall ADL's : Needs assistance/impaired Eating/Feeding: Independent Grooming: Standing, Minimal assistance Upper Body Bathing: Independent Lower Body Bathing: Maximal assistance Lower Body Bathing Details (indicate cue type and reason): recommended long bath sponge  Upper Body Dressing : Independent Lower Body Dressing: Sit to/from stand, Minimal  assistance Lower Body Dressing Details (indicate cue type and reason): educated in use of her reacher and compensatory strategies for LB dressing, pt usually avoids socks or pulls her leg up onto the bed, pt not interested in sock aide Toilet Transfer: Minimal assistance Toileting- Clothing Manipulation and Hygiene: Minimal assistance, Sit to/from stand Functional mobility during ADLs: Supervision/safety, Rolling walker General ADL Comments: Pt has had this type of sx 3 times, most recently in January. She is knowledgeble in uses of 3 in 1 and has no concerns about returning home with intermittent assist of her granddaughter.    Mobility  Overal bed mobility: Modified Independent Bed Mobility: Supine to Sit, Sit to Supine Supine to sit: Modified independent (Device/Increase time) Sit to supine: Modified independent (Device/Increase time) General bed mobility comments: Patient required increased time, stopping frequently due to RLE pain.  Min assist to steady during transitions.    Transfers  Overall transfer level: Needs assistance Equipment used: Rolling walker (2 wheeled) Transfers: Sit to/from Stand Sit to Stand: Min assist Stand pivot transfers: Min assist Squat pivot transfers: Min assist General transfer comment: cues for hand placement, sequence and safety, assist to rise and assist to perform pivot to [facility]    Ambulation / Gait / Stairs / Wheelchair Mobility  Ambulation/Gait Ambulation/Gait assistance: Min assist, +2 safety/equipment (+1 for chair) Ambulation Distance (Feet): 35 Feet Assistive device: Rolling walker (2 wheeled) Gait Pattern/deviations: Step-to pattern General Gait Details: cues for position in RW and not stepping past wheels. Cues for posture and pt cueing herself for right hip extension in standing Gait velocity interpretation: Below normal speed for age/gender    Posture / Balance Balance Overall balance assessment: Needs assistance Sitting-balance  support: No upper extremity supported (LLE supported) Sitting balance-Leahy Scale: Good Standing balance support: Bilateral upper extremity supported, During functional activity Standing balance-Leahy Scale: Poor Standing balance comment: pt able to stand with single UE support at sink, propping on forearms to wash hands    Special needs/care consideration BiPAP/CPAP  N/a CPM  N/a Continuous Drip IV  N/a Dialysis  N/a Life Vest  N/a Oxygen  N/a Special Bed  N/a Trach Size  N/a Wound Vac n/a Skin ecchymosis left arm and r hand; surgical incision with dressing Bowel mgmt: LBM 05/17/17  Bladder mgmt:continent Diabetic mgmt  N/a   Previous Home Environment Living Arrangements:  (pt's 89 year old grand daughter lives with her but is going )  Lives With: Family Available Help at Discharge: Family, Available 24 hours/day (pt can go stay with her sister, Jana Half, if 24/7 supervision ) Type of Home: Apartment Home Layout: One level Home Access: Stairs to enter Entrance Stairs-Rails: Right, Left Entrance Stairs-Number of Steps: 4 Bathroom Shower/Tub: Tub/shower unit, Architectural technologist: Standard Bathroom Accessibility: Yes How Accessible: Accessible via walker Home Care Services: No Additional Comments: pt uses BSC in tub shower and states she can sit on BSC and then swing legs into tub from there.    patient's 78 year old grand daughter lives with her, but is visiting her mother in [place].  Discharge Living Setting Plans for Discharge Living Setting: Patient's home (30 year old grand daughter typically lives with her but goin) Type of Home at Discharge: Apartment Discharge Home Layout: One level Discharge Home Access: Stairs to enter Entrance Stairs-Rails: Right, Left Entrance Stairs-Number of Steps: 3 steps onto porch and then 1 step into home Discharge Bathroom Shower/Tub: Tub/shower unit,  Curtain Discharge Bathroom Toilet: Standard Discharge Bathroom Accessibility:  Yes How Accessible: Accessible via walker Does the patient have any problems obtaining your medications?: No   Patient can return to her home if Mod I. Otherwise she can go stay with a sister who can provide 24/7 supervision.  Social/Family/Support Systems Patient Roles: Caregiver Contact Information: sister, Jana Half Anticipated Caregiver: sister Jana Half Anticipated Ambulance person Information: see above Caregiver Availability: 24/7 Discharge Plan Discussed with Primary Caregiver: Yes Is Caregiver In Agreement with Plan?: Yes Does Caregiver/Family have Issues with Lodging/Transportation while Pt is in Rehab?: No  Goals/Additional Needs Patient/Family Goal for Rehab: Mod I to supervision with PT and OT Expected length of stay: [facility]OS 7-11 days Pt/Family Agrees to Admission and willing to participate: Yes Program Orientation Provided & Reviewed with Pt/Caregiver Including Roles  & Responsibilities: Yes  Decrease burden of Care through IP rehab admission: n/a  Possible need for SNF placement upon discharge: not anticipated  Patient Condition: This patient's condition remains as documented in the consult dated 05/24/2017, in which the Rehabilitation Physician determined and documented that the patient's condition is appropriate for intensive rehabilitative care in an inpatient rehabilitation facility. Will admit to inpatient rehab today.  Preadmission Screen Completed By:  Cleatrice Burke, 05/25/2017 10:17 AM ______________________________________________________________________   Discussed status with Dr. Naaman Plummer on 05/25/2017 at  1017 and received telephone approval for admission today.  Admission Coordinator:  Cleatrice Burke, time [id number] Date 05/25/2017       Cosigned by: Meredith Staggers, MD at 05/25/2017 11:13 AM  Revision History

## 2017-05-25 NOTE — Progress Notes (Signed)
I have insurance approval to admit pt to inpt rehab today. I will contact Attending service and make arrangements to admit today. RN CM made aware. [phone]

## 2017-05-25 NOTE — H&P (Signed)
Physical Medicine and Rehabilitation Admission H&P     Chief Complaint  Patient presents with  . Foot Pain  :  HPI: [name] is a 66 y.o. right handed female with history of polycythemia vera diagnosed July 2016 followed by Dr. Beryle Beams, remote tobacco abuse, CAD/NSTEMI, hypertension, atrial fibrillation maintained on Eliquis, peripheral vascular disease with multiple revascularization procedures. Per chart review and patient,patient lives with 80 year old granddaughter. Independent with a walker prior to admission. Granddaughter has just completed school and can assist. Patient also has a local elderly sister. One level home with 4 steps to entry. Presented 05/20/2017 with right lower extremity ischemic changes and rest pain that progressed over the past week. Underwent right femoral to tibioperoneal trunk bypass 05/17/2017 per Dr. Donnetta Hutching. Postoperative pain management with ongoing progressive ischemic changes and limb was not felt to be salvaged. Underwent right AKA 05/21/2017 per Dr. Donnetta Hutching.Eliquis resume postoperatively.Acute blood loss anemia 8.3 and monitored. Mildly elevated troponin 0.2 for follow-up conservatively by cardiology services. Physical and occupational therapy evaluations resumed postoperatively with recommendations of physical medicine rehabilitation consult. Patient was admitted for a comprehensive rehabilitation program.  Review of Systems  Constitutional: Negative for chills and fever.  HENT: Negative for hearing loss.  Eyes: Negative for blurred vision and double vision.  Respiratory: Positive for shortness of breath.  Cardiovascular: Positive for leg swelling. Negative for chest pain and palpitations.  Gastrointestinal: Positive for constipation. Negative for nausea and vomiting.  GERD  Musculoskeletal: Positive for joint pain and myalgias.  Skin: Negative for rash.  Neurological: Positive for headaches. Negative for seizures and loss of consciousness.  All other  systems reviewed and are negative.       Past Medical History:  Diagnosis Date  . Arthritis    "fingers, some in my knees" (01/28/2016)  . CAD (coronary artery disease), per cath 04/27/13, non obstructive disease, EF 65-70% 05/11/2013  . Carotid disease, bilateral ([place]) 09/2013   Lt carotid 50-69% stentosis, less on rt  . Dysrhythmia   . Fatty liver   . GERD (gastroesophageal reflux disease)   . Glaucoma   . H/O cardiovascular stress test 12/27/2009   negative for ischemia  . Headache    "occasional since eye pressure regulated" (01/28/2016)  . Hyperlipidemia   . Hypertension   . Leukocytosis 07/15/2015  . Migraine    "stopped w/laser holes to relieve pressure in my eyes; had them 3-4 times/wk before taht" (01/28/2016)  . Mitral regurgitation,2+ by cath 05/11/2013  . NSTEMI (non-ST elevated myocardial infarction) ([place]) 04/27/2013  . Pain    BOTH KNEES - PT HAS TORN MENISCUS LEFT KNEE  . Paroxysmal atrial fibrillation (HCC)    hx of a fib on ASA AND ELIQUIS   . Polycythemia vera ([place]) 08/20/2015   JAK-2 positive 07/19/15  . PVD (peripheral vascular disease) with claudication ([place]) 07/2006   previous L ext iliac stent and rt SFA stent, known occluded Lt SFA  . S/P cardiac cath 04/27/13   NON OBSTRUCTIVE DISEASE, 2+ mr, ef 65-70%  . Statin intolerance   . Stroke ([place]) 2006   SWELLING ALL OVER AND RT SIDE OF FACE DRAWN AND SPEECH SLURRED AND NUMBNESS ON RIGHT SIDE-- ALL RESOLVED        Past Surgical History:  Procedure Laterality Date  . AMPUTATION Right 05/21/2017   Procedure: AMPUTATION ABOVE KNEE; Surgeon: Rosetta Posner, MD; Location: [facility]; Service: Vascular; Laterality: Right;  . CARDIAC CATHETERIZATION  04/27/13   non occlusive disease with mild to mod.  calcified lesions in ostial LM and proximal LAD and moderate prox. RC AND DISTAL AV GROOVE LCX, ef  . CATARACT EXTRACTION W/PHACO Right 03/21/2015   Procedure: CATARACT EXTRACTION PHACO AND INTRAOCULAR LENS PLACEMENT RIGHT EYE; Surgeon: Baruch Goldmann, MD; Location: [facility]; Service: Ophthalmology; Laterality: Right; CDE:5.10  . CATARACT EXTRACTION W/PHACO Left 05/16/2015   Procedure: CATARACT EXTRACTION PHACO AND INTRAOCULAR LENS PLACEMENT (IOC); Surgeon: Baruch Goldmann, MD; Location: [facility]; Service: Ophthalmology; Laterality: Left; CDE 5.57  . [place]  . COLONOSCOPY N/A 09/11/2016   Procedure: COLONOSCOPY; Surgeon: Rogene Houston, MD; [facility]; Service: Endoscopy; Laterality: N/A; 730-moved to 1:00 Ann notified pt  . EMBOLECTOMY Right 02/01/2016   Procedure: Thrombectomy of Right Femoral-Popliteal Bypass Graft; Endarterectomy of Right Below Knee Popliteal Artery and Tibial Peroneal Trunk with Bovine Pericardium Patch Angioplasty.; Surgeon: Angelia Mould, MD; Location: [facility]; Service: Vascular; Laterality: Right;  . FEMORAL-POPLITEAL BYPASS GRAFT Right 01/31/2016   Procedure: BYPASS GRAFT RIGHT COMMON FEMORALTO BELOW KNEE POPLITEAL ARTERY BYPASS GRAFT USING 6MM PROPATEN GORTEX GRAFT; Surgeon: Mal Misty, MD; Location: [facility]; Service: Vascular; Laterality: Right;  . FEMORAL-POPLITEAL BYPASS GRAFT Right 01/09/2017   Procedure: THROMBECTOMY OF RIGHT FEMORAL-POPLITEAL ARTERY BYPASS GRAFT; THROMBECTOMY RIGHT TIBIAL VESSELS; Surgeon: [name], MD; Location: [place]; Service: Vascular; Laterality: Right;  . FEMORAL-POPLITEAL BYPASS GRAFT Right 05/17/2017   Procedure: RIGHT FEMORAL-TIBIAL PERONEAL TRUNK ARTERY BYPASS GRAFT USING 47mmX80cm PROPATEN GRAFT WITH REMOVABLE RING; Surgeon: Rosetta Posner, MD; Location: [facility]; Service: Vascular; Laterality: Right;  . FRACTURE SURGERY    . ILIAC ARTERY STENT  07/2006   external iliac and Rt SFA stent 07/2006 AND THE LEFT WAS IN 2006  . KNEE ARTHROSCOPY WITH MEDIAL MENISECTOMY Left 03/27/2014   Procedure: left knee arthorscopy with medial chondraplasty of the medial femoral and patella, medial microfracture technique of medial femoral condyl; Surgeon: Tobi Bastos,  MD; Location: [facility]; Service: Orthopedics; Laterality: Left;  . LEFT HEART CATHETERIZATION WITH CORONARY ANGIOGRAM Right 04/27/2013   Procedure: LEFT HEART CATHETERIZATION WITH CORONARY ANGIOGRAM; Surgeon: Leonie Man, MD; Location: [facility]; Service: Cardiovascular; Laterality: Right;  . LOWER EXTREMITY ANGIOGRAM Right 01/31/2016   Procedure: INTRAOP RIGHT LOWER EXTREMITY ANGIOGRAM; Surgeon: Mal Misty, MD; Location: [facility]; Service: Vascular; Laterality: Right;  . ORIF WRIST FRACTURE Right 1983  . OVARIAN CYST REMOVAL Right   . PATCH ANGIOPLASTY Right 01/31/2016   Procedure: RIGHT COMMON FEMORAL AND PROFUNDA FEMORIS ENDARECTOMY WITH PATCH ANGIOPLASTY; Surgeon: Mal Misty, MD; Location: [facility] [place]; Service: Vascular; Laterality: Right;  . PATCH ANGIOPLASTY Right 01/09/2017   Procedure: PATCH ANGIOPLASTY RIGHT POPLITEAL ARTERY BYPASS GRAFT; Surgeon: [name], MD; Location: [place]; Service: Vascular; Laterality: Right;  . PERIPHERAL VASCULAR CATHETERIZATION N/A 06/27/2015   Procedure: Lower Extremity Angiography; Surgeon: Lorretta Harp, MD; Location: [facility]; Service: Cardiovascular; Laterality: N/A;  . PERIPHERAL VASCULAR CATHETERIZATION Bilateral 01/29/2016   Procedure: Lower Extremity Angiography; Surgeon: Wellington Hampshire, MD; Location: [facility]; Service: Cardiovascular; Laterality: Bilateral;  . PERIPHERAL VASCULAR CATHETERIZATION N/A 01/29/2016   Procedure: Abdominal Aortogram; Surgeon: Wellington Hampshire, MD; Location: [facility]; Service: Cardiovascular; Laterality: N/A;  . REFRACTIVE SURGERY Bilateral    "6 in one eye; 7 in the other; to relieve pressure; not glaucoma" (01/28/2016)  . SFA Right 06/27/2015   overlapping Bahn covered stents  . TUBAL LIGATION  1983  . VEIN HARVEST Left 05/17/2017   Procedure: LEFT LEG GREATER SAPHENOUS  VEIN HARVEST; Surgeon: Rosetta Posner, MD; Location: [place]; Service: Vascular; Laterality: Left;  Marland Kitchen VEIN REPAIR Right  01/31/2016   Procedure: RIGHT GREATER SAPHENOUS VEIN EXAMNED BUT NOT REMOVED; Surgeon: Mal Misty, MD; Location: [facility] [place]; Service: Vascular; Laterality: Right;        Family History  Problem Relation Age of Onset  . CAD Mother   . Lung cancer Mother   . Heart attack Mother   . CAD Father   . Heart disease Father   . Heart attack Father 69   died in hes 91's with heart disease  . Healthy Sister   . Liver cancer Brother   . Healthy Sister   . CAD Brother    has had CABG  . CAD Brother    Social History: reports that she quit smoking about 4 years ago. Her smoking use included Cigarettes. She has a 45.00 pack-year smoking history. She has never used smokeless tobacco. She reports that she does not drink alcohol or use drugs.  Allergies:       Allergies  Allergen Reactions  . Atenolol Rash and Itching  . Clopidogrel Itching  . Demerol [Meperidine] Other (See Comments) and Itching    Unknown, can't remember  Unknown, can't remember   . Inderal [Propranolol] Other (See Comments) and Itching    Doesn't remember   . Oxycodone-Acetaminophen Itching  . Prednisone Other (See Comments)    Muscle spasms  . Statins Itching and Other (See Comments)    Simvastatin-caused severe itching  Pravastatin-caused lesser tiching  One type of statin? Caused stroke in 2006, and other symptoms as result  . Venlafaxine Itching  . Warfarin And Related Other (See Comments)    Caused nose bleeds  . Crestor [Rosuvastatin] Itching and Rash  . Esomeprazole Rash  . Lactose Intolerance (Gi) Other (See Comments)    Bloating, gas  . Lipitor [Atorvastatin] Rash  . Lovaza [Omega-3-Acid Ethyl Esters] Other (See Comments)    nosebleeds  Doesn't remember   . Penicillins Hives, Other (See Comments) and Rash    Questionable high fever when taken and broke out in whelps.   . Pravastatin Itching and Rash  . Simvastatin Itching, Rash and Other (See Comments)    Also lipitor intolerant  . Trilipix [Choline  Fenofibrate] Other (See Comments)    Doesn't remember   . Warfarin Other (See Comments)    nosebleeds  . Effexor [Venlafaxine Hcl] Other (See Comments)    Doesn't remember   . Nexium [Esomeprazole Magnesium] Rash  . Percocet [Oxycodone-Acetaminophen] Other (See Comments)    Doesn't remember   . Plavix [Clopidogrel Bisulfate] Rash         Medications Prior to Admission  Medication Sig Dispense Refill  . acetaminophen (TYLENOL) 500 MG tablet Take 1,000 mg by mouth every 6 (six) hours as needed for moderate pain.     Marland Kitchen apixaban (ELIQUIS) 5 MG TABS tablet Take 1 tablet (5 mg total) by mouth 2 (two) times daily. 60 tablet 1  . ARTIFICIAL TEAR OP Place 1 drop into both eyes as needed (for dry eyes).    Marland Kitchen aspirin EC 325 MG tablet Take 325 mg by mouth daily.    Marland Kitchen diltiazem (CARDIZEM CD) 180 MG 24 hr capsule Take 180 mg by mouth every morning.    . docusate sodium (COLACE) 100 MG capsule Take 200 mg by mouth every evening.     . ezetimibe (ZETIA) 10 MG tablet Take 1 tablet (10 mg total) by mouth  at bedtime. PLEASE CONTACT OFFICE FOR ADDITIONAL REFILLS 7 tablet 0  . fenofibrate (TRICOR) 145 MG tablet Take 1 tablet (145 mg total) by mouth at bedtime. PLEASE CONTACT OFFICE FOR ADDITIONAL REFILLS 7 tablet 0  . ferrous sulfate 325 (65 FE) MG tablet Take 325 mg by mouth every evening.    . flecainide (TAMBOCOR) 50 MG tablet Take 1 tablet (50 mg total) by mouth 2 (two) times daily. PLEASE CONTACT OFFICE FOR ADDITIONAL REFILLS 15 tablet 0  . lisinopril (PRINIVIL,ZESTRIL) 20 MG tablet Take 20 mg by mouth daily.    Marland Kitchen omeprazole (PRILOSEC) 20 MG capsule Take 20 mg by mouth 2 (two) times daily before a meal.     . traMADol (ULTRAM) 50 MG tablet Take 1 tablet (50 mg total) by mouth every 6 (six) hours as needed. Currently: taking med between 2-3 times daily for pain (Patient taking differently: Take 100 mg by mouth 3 (three) times daily. Currently: taking med between 2-3 times daily for pain) 20 tablet 0    Home:  Home Living  Family/patient expects to be discharged to:: Private residence  Living Arrangements: (pt's 40 year old grand daughter lives with her but is going )  Available Help at Discharge: Family, Available 24 hours/day (pt can go stay with her sister, Jana Half, if 24/7 supervision )  Type of Home: Apartment  Home Access: Stairs to enter  [employer] of Steps: 4  Entrance Stairs-Rails: Right, Left  Home Layout: One level  Bathroom Shower/Tub: Tub/shower unit, Artist: Standard  Bathroom Accessibility: Yes  Home Equipment: Environmental consultant - 2 wheels, Bedside commode, Shower seat, Grab bars - tub/shower, Hand held shower head, Adaptive equipment  Adaptive Equipment: Reacher  Additional Comments: pt uses BSC in tub shower and states she can sit on BSC and then swing legs into tub from there.  Lives With: Family  Functional History:  Prior Function  Level of Independence: Independent  ADL's / Homemaking Assistance Needed: has a neighbor that is a CNA that can (A) if she needs to call her. Her name is "wanda"  Comments: pt can return to her own home, or go stay with her sister if needed  Functional Status:  Mobility:  Bed Mobility  Overal bed mobility: Modified Independent  Bed Mobility: Supine to Sit, Sit to Supine  Supine to sit: Modified independent (Device/Increase time)  Sit to supine: Modified independent (Device/Increase time)  General bed mobility comments: Patient required increased time, stopping frequently due to RLE pain. Min assist to steady during transitions.  Transfers  Overall transfer level: Needs assistance  Equipment used: Rolling walker (2 wheeled)  Transfers: Sit to/from Stand  Sit to Stand: Min assist  Stand pivot transfers: Min assist  Squat pivot transfers: Min assist  General transfer comment: cues for hand placement, sequence and safety, assist to rise and assist to perform pivot to [facility]  Ambulation/Gait  Ambulation/Gait  assistance: Min assist, +2 safety/equipment (+1 for chair)  Ambulation Distance (Feet): 35 Feet  Assistive device: Rolling walker (2 wheeled)  Gait Pattern/deviations: Step-to pattern  General Gait Details: cues for position in RW and not stepping past wheels. Cues for posture and pt cueing herself for right hip extension in standing  Gait velocity interpretation: Below normal speed for age/gender   ADL:  ADL  Overall ADL's : Needs assistance/impaired  Eating/Feeding: Independent  Grooming: Standing, Minimal assistance  Upper Body Bathing: Independent  Lower Body Bathing: Maximal assistance  Lower Body Bathing Details (indicate cue type and  reason): recommended long bath sponge  Upper Body Dressing : Independent  Lower Body Dressing: Sit to/from stand, Minimal assistance  Lower Body Dressing Details (indicate cue type and reason): educated in use of her reacher and compensatory strategies for LB dressing, pt usually avoids socks or pulls her leg up onto the bed, pt not interested in sock aide  Toilet Transfer: Minimal assistance  Toileting- Clothing Manipulation and Hygiene: Minimal assistance, Sit to/from stand  Functional mobility during ADLs: Supervision/safety, Rolling walker  General ADL Comments: Pt has had this type of sx 3 times, most recently in January. She is knowledgeble in uses of 3 in 1 and has no concerns about returning home with intermittent assist of her granddaughter.  Cognition:  Cognition  Overall Cognitive Status: Within Functional Limits for tasks assessed  Orientation Level: Oriented X4  Cognition  Arousal/Alertness: Awake/alert  Behavior During Therapy: WFL for tasks assessed/performed  Overall Cognitive Status: Within Functional Limits for tasks assessed  Physical Exam:  Blood pressure (!) 176/62, pulse 67, temperature 98.6 F (37 C), temperature source Oral, resp. rate 18, height 5\' 5"  (1.651 m), weight 66.7 kg (147 lb), SpO2 96 %.  Physical Exam    Constitutional: She appears well-developed.  HENT:  Head: Normocephalic.  Eyes: EOM are normal.  Neck: Normal range of motion. Neck supple. No thyromegaly present.  Cardiovascular: Exam reveals no friction rub.  No murmur heard. Cardiac rate controlled  Respiratory: Effort normal and breath sounds normal. No respiratory distress. She has no wheezes. She has no rales.  GI: Soft. Bowel sounds are normal. She exhibits no distension. There is no tenderness. There is no rebound.  Musculoskeletal:  Right AK stump bulbous, but well formed. tender  Skin:  Incision clean/dry/intact. Graft sites intact/ red  Psychiatric: She has a normal mood and affect. Her behavior is normal.   Neurological: She is alert and oriented to person, place, and time.  Motor: B/L UE 5/5 proximal to distal LLE: 4+/5 (with some pain inhibition).  RLE: HF 3/5 (pain inhibition  Sensory exam intact. Left foot hypersensitive to touch  Lab Results Last 48 Hours  Imaging Results (Last 48 hours)     Medical Problem List and Plan:  1. Decreased functional mobility secondary to right AKA 05/21/2017 as well as PVD with multiple revascularization procedures  -admit to inpatient rehab today  2. DVT Prophylaxis/Anticoagulation: Eliquis resumed postoperatively  3. Pain Management: Ultram as needed  4. Mood: Provide emotional support  5. Neuropsych: This patient is capable of making decisions on her own behalf.  6. Skin/Wound Care: Routine skin checks  7. Fluids/Electrolytes/Nutrition: Routine I&O with follow-up chemistries  8. Atrial fibrillation. Cardiac rate controlled. Continue Eliquis  9. Hypertension. Cardizem CD 180 mg daily, Tambocor 50 mg  twice a day, lisinopril 20 mg daily. Monitor with increased mobility  10. CAD/NSTEMI. Continue aspirin. No chest pain or shortness of breath  11. Polycythemia vera. Follow-up labs  12. GERD. Protonix  Post Admission Physician Evaluation:  1. Functional deficits secondary to right AKA. 2. Patient is admitted to receive collaborative, interdisciplinary care between the physiatrist, rehab nursing staff, and therapy team. 3. Patient's level of medical complexity and substantial therapy needs in context of that medical necessity cannot be provided at a lesser intensity of care such as a SNF. 4. Patient has experienced substantial functional loss from his/her baseline which was documented above under the "Functional History" and "Functional Status" headings. Judging by the patient's diagnosis, physical exam, and functional history, the patient has potential for functional progress which will result in measurable gains while on inpatient rehab. These gains will be of substantial and practical use upon discharge in facilitating mobility and self-care at the household level. 5. Physiatrist will provide 24 hour management of medical needs as well as oversight of the therapy plan/treatment and provide guidance as appropriate regarding the interaction of the two. 6. The Preadmission Screening has been reviewed and patient status is unchanged unless otherwise stated above. 7. 24 hour rehab nursing will assist with bladder management, bowel management, safety, skin/wound care, disease management, medication administration, pain management and patient education and help integrate therapy concepts, techniques,education, etc. 8. PT will assess and treat for/with: Lower extremity strength, range of motion, stamina, balance, functional mobility, safety, adaptive techniques and equipment, pain mgt, pre-prosthetic ed. Goals are: mod I . 9. OT will assess and treat for/with: ADL's, functional mobility, safety, upper  extremity strength, adaptive techniques and equipment, pain mgt, pain control, community reintegration. Goals are: mod I to set up. Therapy may proceed with showering this patient if leg is covered. 10. SLP will assess and treat for/with: n/a. Goals are: n/a. 11. Case Management and Social Worker will assess and treat for psychological issues and discharge planning. 12. Team conference will be held weekly to assess progress toward goals and to determine barriers to discharge. 13. Patient will receive at least 3 hours of therapy per day at least 5 days per week. 14. ELOS: 8-11 days  15. Prognosis: excellent Meredith Staggers, MD, [facility]  05/25/2017  Cathlyn Parsons., PA-C  05/25/2017

## 2017-05-25 NOTE — H&P (Signed)
Physical Medicine and Rehabilitation Admission H&P    Chief Complaint  Patient presents with  . Foot Pain  : HPI: [name] is a 66 y.o. right handed female with history of polycythemia vera diagnosed July 2016 followed by Dr. Beryle Beams, remote tobacco abuse, CAD/NSTEMI, hypertension, atrial fibrillation maintained on Eliquis, peripheral vascular disease with multiple revascularization procedures. Per chart review and patient,patient lives with 61 year old granddaughter. Independent with a walker prior to admission. Granddaughter has just completed school and can assist. Patient also has a local elderly sister. One level home with 4 steps to entry. Presented 05/20/2017 with right lower extremity ischemic changes and rest pain that progressed over the past week. Underwent right femoral to tibioperoneal trunk bypass 05/17/2017 per Dr. Donnetta Hutching. Postoperative pain management with ongoing progressive ischemic changes and limb was not felt to be salvaged. Underwent right AKA 05/21/2017 per Dr. Donnetta Hutching.Eliquis resume postoperatively.Acute blood loss anemia 8.3 and monitored. Mildly elevated troponin 0.2 for follow-up conservatively by cardiology services. Physical and occupational therapy evaluations resumed postoperatively with recommendations of physical medicine rehabilitation consult. Patient was admitted for a comprehensive rehabilitation program.  Review of Systems  Constitutional: Negative for chills and fever.  HENT: Negative for hearing loss.   Eyes: Negative for blurred vision and double vision.  Respiratory: Positive for shortness of breath.   Cardiovascular: Positive for leg swelling. Negative for chest pain and palpitations.  Gastrointestinal: Positive for constipation. Negative for nausea and vomiting.       GERD  Musculoskeletal: Positive for joint pain and myalgias.  Skin: Negative for rash.  Neurological: Positive for headaches. Negative for seizures and loss of consciousness.    All other systems reviewed and are negative.  Past Medical History:  Diagnosis Date  . Arthritis    "fingers, some in my knees" (01/28/2016)  . CAD (coronary artery disease), per cath 04/27/13, non obstructive disease, EF 65-70% 05/11/2013  . Carotid disease, bilateral ([place]) 09/2013   Lt carotid 50-69% stentosis, less on rt  . Dysrhythmia   . Fatty liver   . GERD (gastroesophageal reflux disease)   . Glaucoma   . H/O cardiovascular stress test 12/27/2009   negative for ischemia  . Headache    "occasional since eye pressure regulated" (01/28/2016)  . Hyperlipidemia   . Hypertension   . Leukocytosis 07/15/2015  . Migraine    "stopped w/laser holes to relieve pressure in my eyes; had them 3-4 times/wk before taht" (01/28/2016)  . Mitral regurgitation,2+ by cath 05/11/2013  . NSTEMI (non-ST elevated myocardial infarction) ([place]) 04/27/2013  . Pain    BOTH KNEES - PT HAS TORN MENISCUS LEFT KNEE  . Paroxysmal atrial fibrillation (HCC)    hx of a fib on ASA  AND ELIQUIS   . Polycythemia vera ([place]) 08/20/2015   JAK-2 positive 07/19/15  . PVD (peripheral vascular disease) with claudication ([place]) 07/2006   previous L ext iliac stent and rt SFA stent, known occluded Lt SFA  . S/P cardiac cath 04/27/13   NON OBSTRUCTIVE DISEASE, 2+ mr, ef 65-70%  . Statin intolerance   . Stroke ([place]) 2006   SWELLING ALL OVER AND RT SIDE OF FACE DRAWN AND SPEECH SLURRED AND NUMBNESS ON RIGHT SIDE-- ALL RESOLVED   Past Surgical History:  Procedure Laterality Date  . AMPUTATION Right 05/21/2017   Procedure: AMPUTATION ABOVE KNEE;  Surgeon: Rosetta Posner, MD;  Location: [facility];  Service: Vascular;  Laterality: Right;  . CARDIAC CATHETERIZATION  04/27/13   non occlusive disease with  mild to mod. calcified lesions in ostial LM and proximal LAD and moderate prox. RC AND DISTAL AV GROOVE LCX, ef  . CATARACT EXTRACTION W/PHACO Right 03/21/2015   Procedure: CATARACT EXTRACTION PHACO AND INTRAOCULAR LENS PLACEMENT RIGHT EYE;  Surgeon:  Baruch Goldmann, MD;  [facility];  Service: Ophthalmology;  Laterality: Right;  CDE:5.10  . CATARACT EXTRACTION W/PHACO Left 05/16/2015   Procedure: CATARACT EXTRACTION PHACO AND INTRAOCULAR LENS PLACEMENT (IOC);  Surgeon: Baruch Goldmann, MD;  Location: [facility];  Service: Ophthalmology;  Laterality: Left;  CDE 5.57  . [place]  . COLONOSCOPY N/A 09/11/2016   Procedure: COLONOSCOPY;  Surgeon: Rogene Houston, MD;  [facility];  Service: Endoscopy;  Laterality: N/A;  730-moved to 1:00 Ann notified pt  . EMBOLECTOMY Right 02/01/2016   Procedure: Thrombectomy of Right Femoral-Popliteal Bypass Graft; Endarterectomy of Right Below Knee Popliteal Artery and Tibial Peroneal Trunk with Bovine Pericardium Patch Angioplasty.;  Surgeon: Angelia Mould, MD;  Location: [facility];  Service: Vascular;  Laterality: Right;  . FEMORAL-POPLITEAL BYPASS GRAFT Right 01/31/2016   Procedure: BYPASS GRAFT RIGHT COMMON  FEMORALTO BELOW KNEE POPLITEAL ARTERY BYPASS GRAFT USING 6MM PROPATEN GORTEX GRAFT;  Surgeon: Mal Misty, MD;  Location: [facility];  Service: Vascular;  Laterality: Right;  . FEMORAL-POPLITEAL BYPASS GRAFT Right 01/09/2017   Procedure: THROMBECTOMY OF RIGHT FEMORAL-POPLITEAL  ARTERY BYPASS GRAFT; THROMBECTOMY RIGHT  TIBIAL VESSELS;  Surgeon: [name], MD;  Location: [facility];  Service: Vascular;  Laterality: Right;  . FEMORAL-POPLITEAL BYPASS GRAFT Right 05/17/2017   Procedure: RIGHT FEMORAL-TIBIAL PERONEAL TRUNK ARTERY BYPASS GRAFT USING 71mmX80cm PROPATEN GRAFT WITH REMOVABLE RING;  Surgeon: Rosetta Posner, MD;  Location: [facility];  Service: Vascular;  Laterality: Right;  . FRACTURE SURGERY    . ILIAC ARTERY STENT  07/2006   external iliac and Rt SFA stent 07/2006 AND THE LEFT WAS IN 2006  . KNEE ARTHROSCOPY WITH MEDIAL MENISECTOMY Left 03/27/2014   Procedure: left knee arthorscopy with medial chondraplasty of the medial femoral and patella, medial microfracture technique of medial  femoral condyl;  Surgeon: Tobi Bastos, MD;  Location: [facility];  Service: Orthopedics;  Laterality: Left;  . LEFT HEART CATHETERIZATION WITH CORONARY ANGIOGRAM Right 04/27/2013   Procedure: LEFT HEART CATHETERIZATION WITH CORONARY ANGIOGRAM;  Surgeon: Leonie Man, MD;  Location: [facility] CATH LAB;  Service: Cardiovascular;  Laterality: Right;  . LOWER EXTREMITY ANGIOGRAM Right 01/31/2016   Procedure: INTRAOP RIGHT LOWER EXTREMITY ANGIOGRAM;  Surgeon: Mal Misty, MD;  Location: [facility];  Service: Vascular;  Laterality: Right;  . ORIF WRIST FRACTURE Right 1983  . OVARIAN CYST REMOVAL Right   . PATCH ANGIOPLASTY Right 01/31/2016   Procedure: RIGHT COMMON FEMORAL AND PROFUNDA FEMORIS ENDARECTOMY WITH PATCH ANGIOPLASTY;  Surgeon: Mal Misty, MD;  Location: [facility];  Service: Vascular;  Laterality: Right;  . PATCH ANGIOPLASTY Right 01/09/2017   Procedure: PATCH ANGIOPLASTY RIGHT POPLITEAL ARTERY BYPASS GRAFT;  Surgeon: [name], MD;  Location: [place];  Service: Vascular;  Laterality: Right;  . PERIPHERAL VASCULAR CATHETERIZATION N/A 06/27/2015   Procedure: Lower Extremity Angiography;  Surgeon: Lorretta Harp, MD;  Location: [facility] CV LAB;  Service: Cardiovascular;  Laterality: N/A;  . PERIPHERAL VASCULAR CATHETERIZATION Bilateral 01/29/2016   Procedure: Lower Extremity Angiography;  Surgeon: Wellington Hampshire, MD;  Location: [facility];  Service: Cardiovascular;  Laterality: Bilateral;  . PERIPHERAL VASCULAR CATHETERIZATION N/A 01/29/2016   Procedure: Abdominal Aortogram;  Surgeon: Wellington Hampshire, MD;  Location: [facility] CV LAB;  Service: Cardiovascular;  Laterality: N/A;  . REFRACTIVE SURGERY Bilateral    "6 in one eye; 7 in the other; to relieve pressure; not glaucoma" (01/28/2016)  . SFA Right 06/27/2015   overlapping Bahn covered stents  . TUBAL LIGATION  1983  . VEIN HARVEST Left 05/17/2017   Procedure: LEFT LEG GREATER SAPHENOUS VEIN HARVEST;  Surgeon: Rosetta Posner, MD;   Location: [facility];  Service: Vascular;  Laterality: Left;  Marland Kitchen VEIN REPAIR Right 01/31/2016   Procedure: RIGHT GREATER SAPHENOUS VEIN EXAMNED BUT NOT REMOVED;  Surgeon: Mal Misty, MD;  Location: [facility];  Service: Vascular;  Laterality: Right;   Family History  Problem Relation Age of Onset  . CAD Mother   . Lung cancer Mother   . Heart attack Mother   . CAD Father   . Heart disease Father   . Heart attack Father 44       died in hes 59's with heart disease  . Healthy Sister   . Liver cancer Brother   . Healthy Sister   . CAD Brother        has had CABG  . CAD Brother    Social History:  reports that she quit smoking about 4 years ago. Her smoking use included Cigarettes. She has a 45.00 pack-year smoking history. She has never used smokeless tobacco. She reports that she does not drink alcohol or use drugs. Allergies:  Allergies  Allergen Reactions  . Atenolol Rash and Itching  . Clopidogrel Itching  . Demerol [Meperidine] Other (See Comments) and Itching    Unknown, can't remember  Unknown, can't remember   . Inderal [Propranolol] Other (See Comments) and Itching    Doesn't remember   . Oxycodone-Acetaminophen Itching  . Prednisone Other (See Comments)    Muscle spasms  . Statins Itching and Other (See Comments)    Simvastatin-caused severe itching Pravastatin-caused lesser tiching One type of statin? Caused stroke in 2006, and other symptoms as result  . Venlafaxine Itching  . Warfarin And Related Other (See Comments)    Caused nose bleeds  . Crestor [Rosuvastatin] Itching and Rash  . Esomeprazole Rash  . Lactose Intolerance (Gi) Other (See Comments)    Bloating, gas  . Lipitor [Atorvastatin] Rash  . Lovaza [Omega-3-Acid Ethyl Esters] Other (See Comments)    nosebleeds Doesn't remember   . Penicillins Hives, Other (See Comments) and Rash    Questionable high fever when taken and broke out in whelps.   . Pravastatin Itching and Rash  . Simvastatin Itching, Rash and  Other (See Comments)    Also lipitor intolerant  . Trilipix [Choline Fenofibrate] Other (See Comments)    Doesn't remember   . Warfarin Other (See Comments)    nosebleeds  . Effexor [Venlafaxine Hcl] Other (See Comments)    Doesn't remember   . Nexium [Esomeprazole Magnesium] Rash  . Percocet [Oxycodone-Acetaminophen] Other (See Comments)    Doesn't remember   . Plavix [Clopidogrel Bisulfate] Rash   Medications Prior to Admission  Medication Sig Dispense Refill  . acetaminophen (TYLENOL) 500 MG tablet Take 1,000 mg by mouth every 6 (six) hours as needed for moderate pain.     Marland Kitchen apixaban (ELIQUIS) 5 MG TABS tablet Take 1 tablet (5 mg total) by mouth 2 (two) times daily. 60 tablet 1  . ARTIFICIAL TEAR OP Place 1 drop into both eyes as needed (for dry eyes).    Marland Kitchen  aspirin EC 325 MG tablet Take 325 mg by mouth daily.    Marland Kitchen diltiazem (CARDIZEM CD) 180 MG 24 hr capsule Take 180 mg by mouth every morning.    . docusate sodium (COLACE) 100 MG capsule Take 200 mg by mouth every evening.     . ezetimibe (ZETIA) 10 MG tablet Take 1 tablet (10 mg total) by mouth at bedtime. PLEASE CONTACT OFFICE FOR ADDITIONAL REFILLS 7 tablet 0  . fenofibrate (TRICOR) 145 MG tablet Take 1 tablet (145 mg total) by mouth at bedtime. PLEASE CONTACT OFFICE FOR ADDITIONAL REFILLS 7 tablet 0  . ferrous sulfate 325 (65 FE) MG tablet Take 325 mg by mouth every evening.    . flecainide (TAMBOCOR) 50 MG tablet Take 1 tablet (50 mg total) by mouth 2 (two) times daily. PLEASE CONTACT OFFICE FOR ADDITIONAL REFILLS 15 tablet 0  . lisinopril (PRINIVIL,ZESTRIL) 20 MG tablet Take 20 mg by mouth daily.    Marland Kitchen omeprazole (PRILOSEC) 20 MG capsule Take 20 mg by mouth 2 (two) times daily before a meal.     . traMADol (ULTRAM) 50 MG tablet Take 1 tablet (50 mg total) by mouth every 6 (six) hours as needed. Currently: taking med between 2-3 times daily for pain (Patient taking differently: Take 100 mg by mouth 3 (three) times daily. Currently:  taking med between 2-3 times daily for pain) 20 tablet 0    Home: Home Living Family/patient expects to be discharged to:: Private residence Living Arrangements:  (pt's 44 year old grand daughter lives with her but is going ) Available Help at Discharge: Family, Available 24 hours/day (pt can go stay with her sister, Jana Half, if 24/7 supervision ) Type of Home: Apartment Home Access: Stairs to enter [employer] of Steps: 4 Entrance Stairs-Rails: Right, Left Home Layout: One level Bathroom Shower/Tub: Tub/shower unit, Architectural technologist: Standard Bathroom Accessibility: Yes Home Equipment: Environmental consultant - 2 wheels, Bedside commode, Shower seat, Grab bars - tub/shower, Hand held shower head, Adaptive equipment Adaptive Equipment: Reacher Additional Comments: pt uses BSC in tub shower and states she can sit on BSC and then swing legs into tub from there.   Lives With: Family   Functional History: Prior Function Level of Independence: Independent ADL's / Homemaking Assistance Needed: has a neighbor that is a CNA that can (A) if she needs to call her. Her name is "wanda" Comments: pt can return to her own home, or go stay with her sister if needed  Functional Status:  Mobility: Bed Mobility Overal bed mobility: Modified Independent Bed Mobility: Supine to Sit, Sit to Supine Supine to sit: Modified independent (Device/Increase time) Sit to supine: Modified independent (Device/Increase time) General bed mobility comments: Patient required increased time, stopping frequently due to RLE pain.  Min assist to steady during transitions. Transfers Overall transfer level: Needs assistance Equipment used: Rolling walker (2 wheeled) Transfers: Sit to/from Stand Sit to Stand: Min assist Stand pivot transfers: Min assist Squat pivot transfers: Min assist General transfer comment: cues for hand placement, sequence and safety, assist to rise and assist to perform pivot to  [facility] Ambulation/Gait Ambulation/Gait assistance: Min assist, +2 safety/equipment (+1 for chair) Ambulation Distance (Feet): 35 Feet Assistive device: Rolling walker (2 wheeled) Gait Pattern/deviations: Step-to pattern General Gait Details: cues for position in RW and not stepping past wheels. Cues for posture and pt cueing herself for right hip extension in standing Gait velocity interpretation: Below normal speed for age/gender    ADL: ADL Overall ADL's : Needs  assistance/impaired Eating/Feeding: Independent Grooming: Standing, Minimal assistance Upper Body Bathing: Independent Lower Body Bathing: Maximal assistance Lower Body Bathing Details (indicate cue type and reason): recommended long bath sponge Upper Body Dressing : Independent Lower Body Dressing: Sit to/from stand, Minimal assistance Lower Body Dressing Details (indicate cue type and reason): educated in use of her reacher and compensatory strategies for LB dressing, pt usually avoids socks or pulls her leg up onto the bed, pt not interested in sock aide Toilet Transfer: Minimal assistance Toileting- Clothing Manipulation and Hygiene: Minimal assistance, Sit to/from stand Functional mobility during ADLs: Supervision/safety, Rolling walker General ADL Comments: Pt has had this type of sx 3 times, most recently in January. She is knowledgeble in uses of 3 in 1 and has no concerns about returning home with intermittent assist of her granddaughter.  Cognition: Cognition Overall Cognitive Status: Within Functional Limits for tasks assessed Orientation Level: Oriented X4 Cognition Arousal/Alertness: Awake/alert Behavior During Therapy: WFL for tasks assessed/performed Overall Cognitive Status: Within Functional Limits for tasks assessed  Physical Exam: Blood pressure (!) 176/62, pulse 67, temperature 98.6 F (37 C), temperature source Oral, resp. rate 18, height 5\' 5"  (1.651 m), weight 66.7 kg (147 lb), SpO2 96 %. Physical  Exam  Constitutional: She appears well-developed.  HENT:  Head: Normocephalic.  Eyes: EOM are normal.  Neck: Normal range of motion. Neck supple. No thyromegaly present.  Cardiovascular: Exam reveals no friction rub.   No murmur heard. Cardiac rate controlled  Respiratory: Effort normal and breath sounds normal. No respiratory distress. She has no wheezes. She has no rales.  GI: Soft. Bowel sounds are normal. She exhibits no distension. There is no tenderness. There is no rebound.  Musculoskeletal:  Right AK stump bulbous, but well formed. tender  Skin:  Incision clean/dry/intact. Graft sites intact/ red  Psychiatric: She has a normal mood and affect. Her behavior is normal.   Neurological: She is alert and oriented to person, place, and time.  Motor: B/L UE 5/5 proximal to distal LLE: 4+/5 (with some pain inhibition).  RLE: HF 3/5 (pain inhibition Sensory exam intact. Left foot hypersensitive to touch    Results for orders placed or performed during the hospital encounter of 05/17/17 (from the past 48 hour(s))  Troponin I     Status: Abnormal   Collection Time: 05/23/17 10:02 AM  Result Value Ref Range   Troponin I 0.24 (HH) <0.03 ng/mL    Comment: CRITICAL RESULT CALLED TO, READ BACK BY AND VERIFIED WITH: WHITESIDECRN [id number] [place] REPEATED TO VERIFY   CBC     Status: Abnormal   Collection Time: 05/23/17 10:02 AM  Result Value Ref Range   WBC 17.3 (H) 4.0 - 10.5 K/uL    Comment: REPEATED TO VERIFY WHITE COUNT CONFIRMED ON SMEAR    RBC 4.03 3.87 - 5.11 MIL/uL   Hemoglobin 8.8 (L) 12.0 - 15.0 g/dL   HCT 30.3 (L) 36.0 - 46.0 %   MCV 75.2 (L) 78.0 - 100.0 fL   MCH 21.8 (L) 26.0 - 34.0 pg   MCHC 29.0 (L) 30.0 - 36.0 g/dL   RDW 22.0 (H) 11.5 - 15.5 %   Platelets 376 150 - 400 K/uL    Comment: REPEATED TO VERIFY PLATELET COUNT CONFIRMED BY SMEAR   Troponin I     Status: None   Collection Time: 05/23/17  2:38 PM  Result Value Ref Range   Troponin I <0.03 <0.03  ng/mL    Comment: REPEATED TO VERIFY  Troponin I  Status: Abnormal   Collection Time: 05/23/17  9:14 PM  Result Value Ref Range   Troponin I 0.04 (HH) <0.03 ng/mL    Comment: CRITICAL RESULT CALLED TO, READ BACK BY AND VERIFIED WITH: WADE,A RN 05/23/2017 [id number] JORDANS   CBC     Status: Abnormal   Collection Time: 05/24/17  5:22 AM  Result Value Ref Range   WBC 15.5 (H) 4.0 - 10.5 K/uL    Comment: REPEATED TO VERIFY WHITE COUNT CONFIRMED ON SMEAR    RBC 3.85 (L) 3.87 - 5.11 MIL/uL   Hemoglobin 8.3 (L) 12.0 - 15.0 g/dL   HCT 29.2 (L) 36.0 - 46.0 %   MCV 75.8 (L) 78.0 - 100.0 fL   MCH 21.6 (L) 26.0 - 34.0 pg   MCHC 28.4 (L) 30.0 - 36.0 g/dL   RDW 22.2 (H) 11.5 - 15.5 %   Platelets 353 150 - 400 K/uL    Comment: REPEATED TO VERIFY PLATELET COUNT CONFIRMED BY SMEAR    No results found.     Medical Problem List and Plan: 1.  Decreased functional mobility secondary to right AKA 05/21/2017 as well as PVD with multiple revascularization procedures  -admit to inpatient rehab today 2.  DVT Prophylaxis/Anticoagulation: Eliquis resumed postoperatively 3. Pain Management: Ultram as needed 4. Mood: Provide emotional support 5. Neuropsych: This patient is capable of making decisions on her own behalf. 6. Skin/Wound Care: Routine skin checks 7. Fluids/Electrolytes/Nutrition: Routine I&O with follow-up chemistries 8. Atrial fibrillation. Cardiac rate controlled. Continue Eliquis 9. Hypertension. Cardizem CD 180 mg daily, Tambocor 50 mg twice a day, lisinopril 20 mg daily. Monitor with increased mobility 10. CAD/NSTEMI. Continue aspirin. No chest pain or shortness of breath 11. Polycythemia vera. Follow-up labs 12. GERD. Protonix  Post Admission Physician Evaluation: 1. Functional deficits secondary  to right AKA. 2. Patient is admitted to receive collaborative, interdisciplinary care between the physiatrist, rehab nursing staff, and therapy team. 3. Patient's level of medical  complexity and substantial therapy needs in context of that medical necessity cannot be provided at a lesser intensity of care such as a SNF. 4. Patient has experienced substantial functional loss from his/her baseline which was documented above under the "Functional History" and "Functional Status" headings.  Judging by the patient's diagnosis, physical exam, and functional history, the patient has potential for functional progress which will result in measurable gains while on inpatient rehab.  These gains will be of substantial and practical use upon discharge  in facilitating mobility and self-care at the household level. 5. Physiatrist will provide 24 hour management of medical needs as well as oversight of the therapy plan/treatment and provide guidance as appropriate regarding the interaction of the two. 6. The Preadmission Screening has been reviewed and patient status is unchanged unless otherwise stated above. 7. 24 hour rehab nursing will assist with bladder management, bowel management, safety, skin/wound care, disease management, medication administration, pain management and patient education  and help integrate therapy concepts, techniques,education, etc. 8. PT will assess and treat for/with: Lower extremity strength, range of motion, stamina, balance, functional mobility, safety, adaptive techniques and equipment, pain mgt, pre-prosthetic ed.   Goals are: mod I . 9. OT will assess and treat for/with: ADL's, functional mobility, safety, upper extremity strength, adaptive techniques and equipment, pain mgt, pain control, community reintegration.   Goals are: mod I to set up. Therapy may proceed with showering this patient if leg is covered. 10. SLP will assess and treat for/with: n/a.  Goals are: n/a. 11.  Case Management and Social Worker will assess and treat for psychological issues and discharge planning. 12. Team conference will be held weekly to assess progress toward goals and to  determine barriers to discharge. 13. Patient will receive at least 3 hours of therapy per day at least 5 days per week. 14. ELOS: 8-11 days       15. Prognosis:  excellent     Meredith Staggers, MD, [facility] 05/25/2017  Cathlyn Parsons., PA-C 05/25/2017

## 2017-05-25 NOTE — Discharge Summary (Signed)
[facility] Discharge Summary  [name] 04-24-1951 66 y.o. female  [id number]  Admission Date: 05/17/2017  Discharge Date: 05/25/2017  Physician: Rosetta Posner, MD  Admission Diagnosis: leg numbness Ischemic Right leg Wound Right Leg  HPI:   This is a 66 y.o. female who presented to the ED today with complaints of right foot and leg pain.  She states that about a week ago, she started having some pains in her foot when she would wake, but she didn't think much of it because it would improve once she was up moving around.  She states that this morning between 0630 and 0700, she had pain in her left foot that did not get better.  Her pain is now constant.  She cannot feel or move her right toes.  She says that last night when she went to bed, her right foot looked like the left foot.  Nothing really makes it better.  She states she does have some cramping in the left calf with walking and improves when she rests.    On 01/31/16, she underwent right femoral to below knee bypass with propaten; right common femoral endarterectomy with patch angioplasty.  Her saphenous vein was examined, but was inadequate.  The next evening, her bypass occluded and she was taken back to the OR and underwent thrombectomy of the bypass graft, endarterectomy of the below knee popliteal and tibial peroneal trunk and bovine pericardial patch angioplasty of the BK artery and tibial peroneal.  Earlier this year, she presented with an ischemic right leg and was taken to the OR and underwent thrombectomy of the right bypass graft and thrombectomy of the tibial vessels and patch angioplasty of the right popliteal artery bypass graft.   She also has a hx of stenting of the right SFA by Dr. [name].  She does have a persistent leukocytosis and was diagnosed with polycythemia vera.  She is on Eliquis bid and aspirin. She also has a hx of PAF.  She has CAD with hx of STEMI in May 2014.    Marland KitchenShe also  takes a daily aspirin.   She takes Zetia and Statistician for cholesterol management.  She is on an ACEI and CCB for blood pressure control.    Hospital Course:  The patient was admitted to the hospital and taken to the operating room on 05/17/2017 and underwent right femoral to tibioperoneal trunk bypass with composite Gore-Tex graft and great saphenous vein from the left thigh    The patient tolerated the procedure well and was transported to the PACU in stable condition.   She initially had decent doppler flow to her right foot and her pain was better. Unfortunately, she had early reocclusion of her bypass on POD 2. Dr. Donnetta Hutching discussed right BKA vs AKA. Given that she had no flow at the popliteal artery, she had very little chance of healing a below-knee incision. She was taken to the operating room on 05/21/17 and underwent right AKA. She tolerated the procedure well.   On POD 2, the patient's dressing was taken down and her amputation site was healing well. She did complain of some chest pain and intermittent nausea. Her pain was positional.  It was felt that these were GI symptoms. Her EKG was NSR without ischemia and troponin was 0.24. This was discussed with cardiology. She was continued on ASA/Eliquis.   She had no further chest pain after this. PT/OT recommended CIR. Her amputation site and prior incisions were healing  well. She will be discharged to [facility] pending insurance approval. She was discharged to CIR on 05/25/17 in good condition.   CBC    Component Value Date/Time   WBC 15.5 (H) 05/24/2017 0522   RBC 3.85 (L) 05/24/2017 0522   HGB 8.3 (L) 05/24/2017 0522   HCT 29.2 (L) 05/24/2017 0522   HCT 30.6 (L) 03/09/2017 1200   PLT 353 05/24/2017 0522   PLT 429 (H) 03/09/2017 1200   MCV 75.8 (L) 05/24/2017 0522   MCV 62 (L) 03/09/2017 1200   MCH 21.6 (L) 05/24/2017 0522   MCHC 28.4 (L) 05/24/2017 0522   RDW 22.2 (H) 05/24/2017 0522   RDW 19.9 (H) 03/09/2017 1200   LYMPHSABS 1.3 05/17/2017  0915   LYMPHSABS 1.1 03/09/2017 1200   MONOABS 0.8 05/17/2017 0915   EOSABS 0.8 (H) 05/17/2017 0915   EOSABS 0.7 (H) 03/09/2017 1200   BASOSABS 0.3 (H) 05/17/2017 0915   BASOSABS 0.2 03/09/2017 1200    BMET    Component Value Date/Time   NA 137 05/21/2017 0419   K 3.9 05/21/2017 0419   CL 100 (L) 05/21/2017 0419   CO2 27 05/21/2017 0419   GLUCOSE 106 (H) 05/21/2017 0419   BUN 8 05/21/2017 0419   CREATININE 0.72 05/21/2017 0419   CREATININE 1.33 (H) 03/03/2016 1531   CALCIUM 8.4 (L) 05/21/2017 0419   GFRNONAA >60 05/21/2017 0419   GFRAA >60 05/21/2017 0419     Discharge Instructions:   The patient is discharged to CIR with extensive instructions on wound care and progressive ambulation.  They are instructed not to drive or perform any heavy lifting until returning to see the physician in his office.  Discharge Instructions    Call MD for:  redness, tenderness, or signs of infection (pain, swelling, bleeding, redness, odor or green/yellow discharge around incision site)    Complete by:  As directed    Call MD for:  severe or increased pain, loss or decreased feeling  in affected limb(s)    Complete by:  As directed    Call MD for:  temperature >100.5    Complete by:  As directed    Discharge wound care:    Complete by:  As directed    Wash all wounds daily with soap and water and pat dry. Do not apply any creams or ointments on your incisions.  Right amputation site keep telfa and retention stocking on.   Increase activity slowly    Complete by:  As directed    Resume previous diet    Complete by:  As directed       Discharge Diagnosis:  leg numbness Ischemic Right leg Wound Right Leg  Secondary Diagnosis: Patient Active Problem List   Diagnosis Date Noted  . Unilateral AKA, right ([place])   . Elevated troponin   . PVD (peripheral vascular disease) ([place])   . Benign essential HTN   . Acute blood loss anemia   . Ischemia of right lower extremity 05/17/2017  .  Thrombosis of right femoral artery ([place]) 01/09/2017  . Ischemia of extremity 01/09/2017  . History of colonic polyps 07/30/2016  . Essential hypertension 01/08/2016  . Polycythemia vera ([place]) 08/20/2015  . Leukocytosis 07/15/2015  . S/P arterial stent right SFA overlapping Bahn covered stents 06/27/2015 06/28/2015  . Claudication ([place]) 06/27/2015  . Carotid artery disease ([place]) 08/24/2014  . Osteoarthritis of left knee 03/27/2014  . External hemorrhoids 06/06/2013  . PAF (paroxysmal atrial fibrillation), maintaing SR 05/11/2013  . Chronic  anticoagulation, new- eliquis 05/11/2013  . CAD (coronary artery disease), per cath 04/27/13, non obstructive disease 20-30% LM; LAD 30%; 50-60% OM2; RCA 40%; EF 65-70% 05/11/2013  . Mitral regurgitation,2+ by cath 05/11/2013  . Atrial fibrillation with RVR, hx of PAF previously 04/27/2013  . PAD (peripheral artery disease), Lt carotid 50-70%: , Hx stent Lt exteral iliac & RSFA stent, known occl. LSFA  04/27/2013  . Hyperlipidemia 04/27/2013  . History of CVA (cerebrovascular accident) 04/27/2013  . NSTEMI (non-ST elevated myocardial infarction) ([place]) 04/27/2013   Past Medical History:  Diagnosis Date  . Arthritis    "fingers, some in my knees" (01/28/2016)  . CAD (coronary artery disease), per cath 04/27/13, non obstructive disease, EF 65-70% 05/11/2013  . Carotid disease, bilateral ([place]) 09/2013   Lt carotid 50-69% stentosis, less on rt  . Dysrhythmia   . Fatty liver   . GERD (gastroesophageal reflux disease)   . Glaucoma   . H/O cardiovascular stress test 12/27/2009   negative for ischemia  . Headache    "occasional since eye pressure regulated" (01/28/2016)  . Hyperlipidemia   . Hypertension   . Leukocytosis 07/15/2015  . Migraine    "stopped w/laser holes to relieve pressure in my eyes; had them 3-4 times/wk before taht" (01/28/2016)  . Mitral regurgitation,2+ by cath 05/11/2013  . NSTEMI (non-ST elevated myocardial infarction) ([place]) 04/27/2013  . Pain     BOTH KNEES - PT HAS TORN MENISCUS LEFT KNEE  . Paroxysmal atrial fibrillation (HCC)    hx of a fib on ASA  AND ELIQUIS   . Polycythemia vera ([place]) 08/20/2015   JAK-2 positive 07/19/15  . PVD (peripheral vascular disease) with claudication ([place]) 07/2006   previous L ext iliac stent and rt SFA stent, known occluded Lt SFA  . S/P cardiac cath 04/27/13   NON OBSTRUCTIVE DISEASE, 2+ mr, ef 65-70%  . Statin intolerance   . Stroke ([place]) 2006   SWELLING ALL OVER AND RT SIDE OF FACE DRAWN AND SPEECH SLURRED AND NUMBNESS ON RIGHT SIDE-- ALL RESOLVED     Allergies as of 05/25/2017      Reactions   Atenolol Rash, Itching   Clopidogrel Itching   Demerol [meperidine] Other (See Comments), Itching   Unknown, can't remember  Unknown, can't remember    Inderal [propranolol] Other (See Comments), Itching   Doesn't remember    Oxycodone-acetaminophen Itching   Prednisone Other (See Comments)   Muscle spasms   Statins Itching, Other (See Comments)   Simvastatin-caused severe itching Pravastatin-caused lesser tiching One type of statin? Caused stroke in 2006, and other symptoms as result   Venlafaxine Itching   Warfarin And Related Other (See Comments)   Caused nose bleeds   Crestor [rosuvastatin] Itching, Rash   Esomeprazole Rash   Lactose Intolerance (gi) Other (See Comments)   Bloating, gas   Lipitor [atorvastatin] Rash   Lovaza [omega-3-acid Ethyl Esters] Other (See Comments)   nosebleeds Doesn't remember    Penicillins Hives, Other (See Comments), Rash   Questionable high fever when taken and broke out in whelps.    Pravastatin Itching, Rash   Simvastatin Itching, Rash, Other (See Comments)   Also lipitor intolerant   Trilipix [choline Fenofibrate] Other (See Comments)   Doesn't remember    Warfarin Other (See Comments)   nosebleeds   Effexor [venlafaxine Hcl] Other (See Comments)   Doesn't remember    Nexium [esomeprazole Magnesium] Rash   Percocet [oxycodone-acetaminophen] Other  (See Comments)   Doesn't remember  Plavix [clopidogrel Bisulfate] Rash      Medication List    TAKE these medications   acetaminophen 500 MG tablet Commonly known as:  TYLENOL Take 1,000 mg by mouth every 6 (six) hours as needed for moderate pain.   apixaban 5 MG Tabs tablet Commonly known as:  ELIQUIS Take 1 tablet (5 mg total) by mouth 2 (two) times daily.   ARTIFICIAL TEAR OP Place 1 drop into both eyes as needed (for dry eyes).   aspirin EC 325 MG tablet Take 325 mg by mouth daily.   diltiazem 180 MG 24 hr capsule Commonly known as:  CARDIZEM CD Take 180 mg by mouth every morning.   docusate sodium 100 MG capsule Commonly known as:  COLACE Take 200 mg by mouth every evening.   ezetimibe 10 MG tablet Commonly known as:  ZETIA Take 1 tablet (10 mg total) by mouth at bedtime. PLEASE CONTACT OFFICE FOR ADDITIONAL REFILLS   fenofibrate 145 MG tablet Commonly known as:  TRICOR Take 1 tablet (145 mg total) by mouth at bedtime. PLEASE CONTACT OFFICE FOR ADDITIONAL REFILLS   ferrous sulfate 325 (65 FE) MG tablet Take 325 mg by mouth every evening.   flecainide 50 MG tablet Commonly known as:  TAMBOCOR Take 1 tablet (50 mg total) by mouth 2 (two) times daily. PLEASE CONTACT OFFICE FOR ADDITIONAL REFILLS   lisinopril 20 MG tablet Commonly known as:  PRINIVIL,ZESTRIL Take 20 mg by mouth daily.   omeprazole 20 MG capsule Commonly known as:  PRILOSEC Take 20 mg by mouth 2 (two) times daily before a meal.   traMADol 50 MG tablet Commonly known as:  ULTRAM Take 1 tablet (50 mg total) by mouth every 6 (six) hours as needed. Currently: taking med between 2-3 times daily for pain What changed:  how much to take  when to take this  additional instructions       Disposition: CIR  Patient's condition: is Good  Follow up: 1. Dr. Donnetta Hutching in 4 weeks   Virgina Jock, [place] [facility] [phone] 05/25/2017  8:01 AM  - For VQI Registry use  --- Instructions: Press F2 to tab through selections.  Delete question if not applicable.   Post-op:  Wound infection: No  Graft infection: No  Transfusion: No  New Arrhythmia: No Ipsilateral amputation: Yes, [ ]  Minor, [ ]  BKA, [x ] AKA D/C Ambulatory Status: Wheelchair  Complications: MI: No, [ ]  Troponin only, [ ]  EKG or Clinical CHF: No Resp failure:No, [ ]  Pneumonia, [ ]  Ventilator Chg in renal function: No, [ ]  Inc. Cr > 0.5, [ ]  Temp. Dialysis, [ ]  Permanent dialysis Stroke: No, [ ]  Minor, [ ]  Major Return to OR: Yes  Reason for return to OR: [ ]  Bleeding, [ ]  Infection, [ x] Thrombosis, [ ]  Revision  Discharge medications: Statin use:  No, allergic on fenofibrate ASA use:  yes Plavix use:  No, allergic Beta blocker use: no Coumadin use: no, on Eliquis

## 2017-05-25 NOTE — Clinical Social Work Note (Signed)
Patient discharging to CIR today.  CSW signing off.   Roxan Yamamoto, CSW [phone]  

## 2017-05-26 ENCOUNTER — Encounter (HOSPITAL_COMMUNITY): Payer: Medicare Other

## 2017-05-26 ENCOUNTER — Inpatient Hospital Stay (HOSPITAL_COMMUNITY): Payer: Medicare Other | Admitting: Occupational Therapy

## 2017-05-26 ENCOUNTER — Ambulatory Visit: Payer: Medicare Other | Admitting: Vascular Surgery

## 2017-05-26 ENCOUNTER — Inpatient Hospital Stay (HOSPITAL_COMMUNITY): Payer: Medicare Other | Admitting: Physical Therapy

## 2017-05-26 DIAGNOSIS — E46 Unspecified protein-calorie malnutrition: Secondary | ICD-10-CM

## 2017-05-26 DIAGNOSIS — I10 Essential (primary) hypertension: Secondary | ICD-10-CM

## 2017-05-26 DIAGNOSIS — D72829 Elevated white blood cell count, unspecified: Secondary | ICD-10-CM

## 2017-05-26 DIAGNOSIS — D62 Acute posthemorrhagic anemia: Secondary | ICD-10-CM

## 2017-05-26 LAB — CBC WITH DIFFERENTIAL/PLATELET
Basophils Absolute: 0.2 10*3/uL — ABNORMAL HIGH (ref 0.0–0.1)
Basophils Relative: 1 %
EOS ABS: 0.9 10*3/uL — AB (ref 0.0–0.7)
Eosinophils Relative: 5 %
HCT: 32.2 % — ABNORMAL LOW (ref 36.0–46.0)
Hemoglobin: 9.3 g/dL — ABNORMAL LOW (ref 12.0–15.0)
LYMPHS PCT: 4 %
Lymphs Abs: 0.8 10*3/uL (ref 0.7–4.0)
MCH: 21.6 pg — ABNORMAL LOW (ref 26.0–34.0)
MCHC: 28.9 g/dL — ABNORMAL LOW (ref 30.0–36.0)
MCV: 74.9 fL — ABNORMAL LOW (ref 78.0–100.0)
Monocytes Absolute: 0.6 10*3/uL (ref 0.1–1.0)
Monocytes Relative: 3 %
NEUTROS PCT: 87 %
Neutro Abs: 16.3 10*3/uL — ABNORMAL HIGH (ref 1.7–7.7)
PLATELETS: 456 10*3/uL — AB (ref 150–400)
RBC: 4.3 MIL/uL (ref 3.87–5.11)
RDW: 22.2 % — ABNORMAL HIGH (ref 11.5–15.5)
WBC: 18.8 10*3/uL — ABNORMAL HIGH (ref 4.0–10.5)

## 2017-05-26 LAB — COMPREHENSIVE METABOLIC PANEL
ALBUMIN: 2.6 g/dL — AB (ref 3.5–5.0)
ALT: 26 U/L (ref 14–54)
AST: 19 U/L (ref 15–41)
Alkaline Phosphatase: 106 U/L (ref 38–126)
Anion gap: 11 (ref 5–15)
BUN: 5 mg/dL — ABNORMAL LOW (ref 6–20)
CHLORIDE: 99 mmol/L — AB (ref 101–111)
CO2: 25 mmol/L (ref 22–32)
CREATININE: 0.63 mg/dL (ref 0.44–1.00)
Calcium: 8.3 mg/dL — ABNORMAL LOW (ref 8.9–10.3)
GFR calc non Af Amer: >60 mL/min (ref >60)
Glucose, Bld: 111 mg/dL — ABNORMAL HIGH (ref 65–99)
Potassium: 3.5 mmol/L (ref 3.5–5.1)
SODIUM: 135 mmol/L (ref 135–145)
Total Bilirubin: 0.5 mg/dL (ref 0.3–1.2)
Total Protein: 5.8 g/dL — ABNORMAL LOW (ref 6.5–8.1)

## 2017-05-26 MED ORDER — ACETAMINOPHEN 500 MG PO TABS
500.0000 mg | ORAL_TABLET | ORAL | Status: DC | PRN
  Administered 2017-05-26 – 2017-05-30 (×18): 500 mg via ORAL
  Filled 2017-05-26 (×19): qty 1

## 2017-05-26 MED ORDER — TRAMADOL HCL 50 MG PO TABS: 50.0000 mg | ORAL_TABLET | Freq: Four times a day (QID) | ORAL | Status: DC

## 2017-05-26 MED ORDER — ACETAMINOPHEN 650 MG RE SUPP: 325.0000 mg | Freq: Four times a day (QID) | RECTAL | Status: DC | PRN

## 2017-05-26 MED ORDER — PRO-STAT SUGAR FREE PO LIQD
30.0000 mL | Freq: Two times a day (BID) | ORAL | Status: DC
  Administered 2017-05-26 – 2017-05-31 (×6): 30 mL via ORAL
  Filled 2017-05-26 (×9): qty 30

## 2017-05-26 MED ORDER — TRAMADOL HCL 50 MG PO TABS
50.0000 mg | ORAL_TABLET | ORAL | Status: DC
  Administered 2017-05-26 – 2017-05-31 (×30): 50 mg via ORAL
  Filled 2017-05-26 (×28): qty 1

## 2017-05-26 NOTE — IPOC Note (Signed)
Overall Plan of Care [facility]) Patient Details Name: [name] MRN: [id number] DOB: 03-Jul-1951  Admitting Diagnosis: AKA  Hospital Problems: Principal Problem:   Amputee, above knee, right ([place]) Active Problems:   PAF (paroxysmal atrial fibrillation), maintaing SR   Hypoalbuminemia due to protein-calorie malnutrition ([place])     Functional Problem List: Nursing Bowel, Pain, Medication Management, Skin Integrity  PT Balance, Endurance, Motor, Pain, Safety  OT Balance, Pain, Endurance  SLP    TR         Basic ADL's: OT Grooming, Bathing, Dressing, Toileting     Advanced  ADL's: OT Simple Meal Preparation, Laundry, Light Housekeeping     Transfers: PT Bed Mobility, Bed to Chair, Car, Furniture, Floor  OT Tub/Shower, Agricultural engineer: PT Ambulation, Emergency planning/management officer, Stairs     Additional Impairments: OT None  SLP        TR      Anticipated Outcomes Item Anticipated Outcome  Self Feeding independent  Swallowing      Basic self-care  modified indepnendent  Toileting  modified independent   Bathroom Transfers modified independent  Bowel/Bladder  Mod I   Transfers  Mod I with assistive device  Locomotion  Mod I with assistive device  Communication     Cognition     Pain  3 out of 10   Safety/Judgment  Mod I    Therapy Plan: PT Intensity: Minimum of 1-2 x/day ,45 to 90 minutes PT Frequency: 5 out of 7 days PT Duration Estimated Length of Stay: 7-9 OT Intensity: Minimum of 1-2 x/day, 45 to 90 minutes OT Frequency: 5 out of 7 days OT Duration/Estimated Length of Stay: 7-9 days         Team Interventions: Nursing Interventions Patient/Family Education, Bowel Management, Pain Management, Medication Management, Skin Care/Wound Management, Discharge Planning  PT interventions Ambulation/gait training, Balance/vestibular training, Discharge planning, Disease management/prevention, DME/adaptive equipment instruction, Functional mobility training,  Neuromuscular re-education, Pain management, Patient/family education, Psychosocial support, Stair training, Therapeutic Activities, Therapeutic Exercise, UE/LE Strength taining/ROM, UE/LE Coordination activities, Wheelchair propulsion/positioning  OT Interventions Training and development officer, Academic librarian, Engineer, drilling, Discharge planning, Pain management, Patient/family education, Functional mobility training, Neuromuscular re-education, Therapeutic Exercise, Therapeutic Activities, Self Care/advanced ADL retraining, UE/LE Coordination activities, UE/LE Strength taining/ROM  SLP Interventions    TR Interventions    SW/CM Interventions Discharge Planning, Psychosocial Support, Patient/Family Education    Team Discharge Planning: Destination: PT-Home ,OT- Home , SLP-  Projected Follow-up: PT-Home health PT, OT-  Home health OT, SLP-  Projected Equipment Needs: PT-Wheelchair (measurements), Wheelchair cushion (measurements), OT- Tub/shower bench, SLP-  Equipment Details: PT-reports having rolling walker at home, OT-  Patient/family involved in discharge planning: PT- Patient,  OT-Patient, SLP-   MD ELOS: 7-10 days. Medical Rehab Prognosis:  Good Assessment: 66 y.o. right handed female with history of polycythemia vera diagnosed July 2016 followed by Dr. Beryle Beams, remote tobacco abuse, CAD/NSTEMI, hypertension, atrial fibrillation maintained on Eliquis, peripheral vascular disease with multiple revascularization procedures. Presented 05/20/2017 with right lower extremity ischemic changes and rest pain that progressed over the past week. Underwent right femoral to tibioperoneal trunk bypass 05/17/2017 per Dr. Donnetta Hutching. Postoperative pain management with ongoing progressive ischemic changes and limb was not felt to be salvaged. Underwent right AKA 05/21/2017 per Dr. Donnetta Hutching.Eliquis resumed  postoperatively.Acute blood loss anemia monitored. Mildly elevated troponin 0.2 for  follow-up conservatively by cardiology services. Pt with resulting deficits with mobility, transfers, and self-care.  Will set goals for Mod I with  PT/OT.    See Team Conference Notes for weekly updates to the plan of care

## 2017-05-26 NOTE — Care Management Note (Signed)
Inpatient Rehabilitation Center Individual Statement of Services  [name]  Date:  05/26/2017  Welcome to the [facility].  Our goal is to provide you with an individualized program based on your diagnosis and situation, designed to meet your specific needs.  With this comprehensive rehabilitation program, you will be expected to participate in at least 3 hours of rehabilitation therapies Monday-Friday, with modified therapy programming on the weekends.  Your rehabilitation program will include the following services:  Physical Therapy (PT), Occupational Therapy (OT), 24 hour per day rehabilitation nursing, Therapeutic Recreaction (TR), Case Management (Social Worker), Rehabilitation Medicine, Nutrition Services and Pharmacy Services  Weekly team conferences will be held on Wednesday to discuss your progress.  Your Social Worker will talk with you frequently to get your input and to update you on team discussions.  Team conferences with you and your family in attendance may also be held.  Expected length of stay: 7-10 days  Overall anticipated outcome: mod/i level  Depending on your progress and recovery, your program may change. Your Social Worker will coordinate services and will keep you informed of any changes. Your Social Worker's name and contact numbers are listed  below.  The following services may also be recommended but are not provided by the [place] will be made to provide these services after discharge if needed.  Arrangements include referral to agencies that provide these services.  Your insurance has been verified to be:  [employer]-Medicare & Medicaid Your primary doctor is:  Dione Housekeeper  Pertinent information will be shared with your doctor and your insurance company.  Social Worker:  Ovidio Kin,  [place] or ([phone]  Information discussed with and copy given to patient by: Elease Hashimoto, 05/26/2017, 9:17 AM

## 2017-05-26 NOTE — Patient Care Conference (Signed)
Inpatient RehabilitationTeam Conference and Plan of Care Update Date: 05/26/2017   Time: 2:30 PM    Patient Name: [name]      Medical Record Number: [id number]  Date of Birth: 03-08-1951 Sex: Female         Room/Bed: 4M11C/4M11C-01 Payor Info: Payor: [employer] MEDICARE / Plan: [facility] MEDICARE / Product Type: *No Product type* /    Admitting Diagnosis: AKA  Admit Date/Time:  05/25/2017  2:33 PM Admission Comments: No comment available   Primary Diagnosis:  Amputee, above knee, right ([place]) Principal Problem: Amputee, above knee, right [facility])  Patient Active Problem List   Diagnosis Date Noted  . Hypertensive crisis   . Induration of skin   . Abnormality of gait   . Hypoalbuminemia due to protein-calorie malnutrition ([place])   . Amputee, above knee, right ([place]) 05/25/2017  . Unilateral AKA, right ([place])   . Elevated troponin   . PVD (peripheral vascular disease) ([place])   . Benign essential HTN   . Acute blood loss anemia   . Ischemia of right lower extremity 05/17/2017  . Thrombosis of right femoral artery ([place]) 01/09/2017  . Ischemia of extremity 01/09/2017  . History of colonic polyps 07/30/2016  . Essential hypertension 01/08/2016  . Polycythemia vera ([place]) 08/20/2015  . Leukocytosis 07/15/2015  . S/P arterial stent right SFA overlapping Bahn covered stents 06/27/2015 06/28/2015  . Claudication ([place]) 06/27/2015  . Carotid artery disease ([place]) 08/24/2014  . Osteoarthritis of left knee 03/27/2014  . External hemorrhoids 06/06/2013  . PAF (paroxysmal atrial fibrillation), maintaing SR 05/11/2013  . Chronic anticoagulation, new- eliquis 05/11/2013  . CAD (coronary artery disease), per cath 04/27/13, non obstructive disease 20-30% LM; LAD 30%; 50-60% OM2; RCA 40%; EF 65-70% 05/11/2013  . Mitral regurgitation,2+ by cath 05/11/2013  . Atrial fibrillation with RVR, hx of PAF previously 04/27/2013  . PAD (peripheral artery disease), Lt carotid 50-70%: , Hx stent Lt exteral iliac &  RSFA stent, known occl. LSFA  04/27/2013  . Hyperlipidemia 04/27/2013  . History of CVA (cerebrovascular accident) 04/27/2013  . NSTEMI (non-ST elevated myocardial infarction) ([place]) 04/27/2013    Expected Discharge Date: Expected Discharge Date: 06/02/17  Team Members Present: Physician leading conference: Dr. Delice Lesch Social Worker Present: Ovidio Kin, LCSW Nurse Present: Other (comment) Alda Lea) PT Present: Other (comment) Gwenith Spitz) OT Present: Clyda Greener, OT PPS Coordinator present : Daiva Nakayama, RN, CRRN     Current Status/Progress Goal Weekly Team Focus  Medical   Decreased functional mobility secondary to right AKA  Improve pain, BP, mobility  See above   Bowel/Bladder   Pt is continent of bladder and bowel. LBM was 05/26/17  Pt will be mod I assist with bowel and bladder   continue plan of care   Swallow/Nutrition/ Hydration             ADL's   supervision for UB selfcare, min assist for LB selfcare sit to stand, min asssit for transfers  modified independent  selfcare retraining, balance retraining, transfer training, pt/family education DME education   Mobility   supervision-min assist overall, ambulating 35 ft with rw and min guard  mod I bed mobility, ambulation, transfers.   Progress ambulation, transfers, bed mobility, stairs. Work on DME recommendations and D/C planning.    Communication             Safety/Cognition/ Behavioral Observations  Mod I with safety and cognition  Mod I   To remain safe and free from falls  Pain   Pain 9/10 to RLE, PRN Ultram and tylenol administered with relief  less than 4  continue to administerd PRN pain medications so that pt can participate in therapy with max outcome   Skin   Incisin to RLE is healing, staples intact, no drainage, dry dressing to R and L groin, no drainage  Free from infection with mod I assist  Reinforce s/s of infection and monitor skin for breakdown       *See Care Plan and progress  notes for long and short-term goals.  Barriers to Discharge: Pain, mobility, transfers, HTN, Leukocytosis    Possible Resolutions to Barriers:  Therapies, optimize Bp meds, follow labs    Discharge Planning/Teaching Needs:  Home with intermittent assist from freinds/nieghbors, will not have 24 hr care. Needs to be mod/i before going home so she will be safe at home alone.      Team Discussion:  Goals of mod/i level. MD adjusting BP and pain meds. Watching anemia due to blood loss in surgery. First day of therapy today. New eval  Revisions to Treatment Plan:  New eval DC 6/13   Continued Need for Acute Rehabilitation Level of Care: The patient requires daily medical management by a physician with specialized training in physical medicine and rehabilitation for the following conditions: Daily direction of a multidisciplinary physical rehabilitation program to ensure safe treatment while eliciting the highest outcome that is of practical value to the patient.: Yes Daily medical management of patient stability for increased activity during participation in an intensive rehabilitation regime.: Yes Daily analysis of laboratory values and/or radiology reports with any subsequent need for medication adjustment of medical intervention for : Wound care problems;Blood pressure problems;Post surgical problems  Yazmeen Woolf, Gardiner Rhyme 05/28/2017, 12:50 PM

## 2017-05-26 NOTE — Progress Notes (Signed)
Patient information reviewed and entered into eRehab system by Adriane Gabbert, RN, CRRN, PPS Coordinator.  Information including medical coding and functional independence measure will be reviewed and updated through discharge.     Per nursing patient was given "Data Collection Information Summary for Patients in Inpatient Rehabilitation Facilities with attached "Privacy Act Statement-Health Care Records" upon admission.  

## 2017-05-26 NOTE — Progress Notes (Signed)
Occupational Therapy Session Note  Patient Details  Name: [name] MRN: [id number] Date of Birth: 1951/01/19  Today's Date: 05/26/2017 OT Individual Time: 1045-1200 OT Individual Time Calculation (min): 75 min    Skilled Therapeutic Interventions/Progress Updates:    1:1 Pt in bed when arrived. Reviewed safety with squat pivot transfers and able to perform with supervision after proper setup. Pt able to self propel down to tub room and discussed access to shower at home. Pt able to ambulate short distance in bathroom with RW with steady and transition into bathtub with tub bench with supervision once sitting on bench. Pt with steadying A to return to w/c with RW. Pt transitioned to gym and focus on ability to properly setup her w/c in prep for squat pivot transfers. On EOB focus on LE and UE exercises (all large mm groups) to strengthening ( in prep for heavy use of UEs to compensate) and stretching . Handout provided for LE exercises to avoid contractures in prep for future prosthesis. Discussed and educated on desensitization techniques for residual limb. Pt able to propel self around unit with distant supervision. REturned to her room and setup for lunch.   Therapy Documentation Precautions:  Precautions Precautions: (P) Fall Restrictions Weight Bearing Restrictions: (P) Yes RLE Weight Bearing: (P) Non weight bearing General:   Vital Signs:  Pain:  Requested pain meds at end of session due to discomfort but able to fully participate in session- allowed for rest as needed.  See Function Navigator for Current Functional Status.   Therapy/Group: Individual Therapy  [facility] 05/26/2017, 3:00 PM

## 2017-05-26 NOTE — Evaluation (Signed)
Occupational Therapy Assessment and Plan  Patient Details  Name: [name] MRN: [id number] Date of Birth: 1951-09-29  OT Diagnosis: acute pain and muscle weakness (generalized) Rehab Potential: Rehab Potential (ACUTE ONLY): Excellent ELOS: 7-9 days   Today's Date: 05/26/2017 OT Individual Time: 1045-1200 OT Individual Time Calculation (min): 75 min     Problem List:  Patient Active Problem List   Diagnosis Date Noted  . Hypoalbuminemia due to protein-calorie malnutrition ([place])   . Amputee, above knee, right ([place]) 05/25/2017  . Unilateral AKA, right ([place])   . Elevated troponin   . PVD (peripheral vascular disease) ([place])   . Benign essential HTN   . Acute blood loss anemia   . Ischemia of right lower extremity 05/17/2017  . Thrombosis of right femoral artery ([place]) 01/09/2017  . Ischemia of extremity 01/09/2017  . History of colonic polyps 07/30/2016  . Essential hypertension 01/08/2016  . Polycythemia vera ([place]) 08/20/2015  . Leukocytosis 07/15/2015  . S/P arterial stent right SFA overlapping Bahn covered stents 06/27/2015 06/28/2015  . Claudication ([place]) 06/27/2015  . Carotid artery disease ([place]) 08/24/2014  . Osteoarthritis of left knee 03/27/2014  . External hemorrhoids 06/06/2013  . PAF (paroxysmal atrial fibrillation), maintaing SR 05/11/2013  . Chronic anticoagulation, new- eliquis 05/11/2013  . CAD (coronary artery disease), per cath 04/27/13, non obstructive disease 20-30% LM; LAD 30%; 50-60% OM2; RCA 40%; EF 65-70% 05/11/2013  . Mitral regurgitation,2+ by cath 05/11/2013  . Atrial fibrillation with RVR, hx of PAF previously 04/27/2013  . PAD (peripheral artery disease), Lt carotid 50-70%: , Hx stent Lt exteral iliac & RSFA stent, known occl. LSFA  04/27/2013  . Hyperlipidemia 04/27/2013  . History of CVA (cerebrovascular accident) 04/27/2013  . NSTEMI (non-ST elevated myocardial infarction) ([place]) 04/27/2013    Past Medical History:  Past Medical History:   Diagnosis Date  . Arthritis    "fingers, some in my knees" (01/28/2016)  . CAD (coronary artery disease), per cath 04/27/13, non obstructive disease, EF 65-70% 05/11/2013  . Carotid disease, bilateral ([place]) 09/2013   Lt carotid 50-69% stentosis, less on rt  . Dysrhythmia   . Fatty liver   . GERD (gastroesophageal reflux disease)   . Glaucoma   . H/O cardiovascular stress test 12/27/2009   negative for ischemia  . Headache    "occasional since eye pressure regulated" (01/28/2016)  . Hyperlipidemia   . Hypertension   . Leukocytosis 07/15/2015  . Migraine    "stopped w/laser holes to relieve pressure in my eyes; had them 3-4 times/wk before taht" (01/28/2016)  . Mitral regurgitation,2+ by cath 05/11/2013  . NSTEMI (non-ST elevated myocardial infarction) ([place]) 04/27/2013  . Pain    BOTH KNEES - PT HAS TORN MENISCUS LEFT KNEE  . Paroxysmal atrial fibrillation ([facility])    hx of a fib on ASA  AND ELIQUIS   . Polycythemia vera ([place]) 08/20/2015   JAK-2 positive 07/19/15  . PVD (peripheral vascular disease) with claudication ([place]) 07/2006   previous L ext iliac stent and rt SFA stent, known occluded Lt SFA  . S/P cardiac cath 04/27/13   NON OBSTRUCTIVE DISEASE, 2+ mr, ef 65-70%  . Statin intolerance   . Stroke ([place]) 2006   SWELLING ALL OVER AND RT SIDE OF FACE DRAWN AND SPEECH SLURRED AND NUMBNESS ON RIGHT SIDE-- ALL RESOLVED   Past Surgical History:  Past Surgical History:  Procedure Laterality Date  . AMPUTATION Right 05/21/2017   Procedure: AMPUTATION ABOVE KNEE;  Surgeon: Rosetta Posner, MD;  Location: [facility];  Service: Vascular;  Laterality: Right;  . CARDIAC CATHETERIZATION  04/27/13   non occlusive disease with mild to mod. calcified lesions in ostial LM and proximal LAD and moderate prox. RC AND DISTAL AV GROOVE LCX, ef  . CATARACT EXTRACTION W/PHACO Right 03/21/2015   Procedure: CATARACT EXTRACTION PHACO AND INTRAOCULAR LENS PLACEMENT RIGHT EYE;  Surgeon: Baruch Goldmann, MD;  Location: [facility] ORS;  Service:  Ophthalmology;  Laterality: Right;  CDE:5.10  . CATARACT EXTRACTION W/PHACO Left 05/16/2015   Procedure: CATARACT EXTRACTION PHACO AND INTRAOCULAR LENS PLACEMENT (IOC);  Surgeon: Baruch Goldmann, MD;  Location: [facility] ORS;  Service: Ophthalmology;  Laterality: Left;  CDE 5.57  . [place]  . COLONOSCOPY N/A 09/11/2016   Procedure: COLONOSCOPY;  Surgeon: Rogene Houston, MD;  Location: [facility];  Service: Endoscopy;  Laterality: N/A;  730-moved to 1:00 Ann notified pt  . EMBOLECTOMY Right 02/01/2016   Procedure: Thrombectomy of Right Femoral-Popliteal Bypass Graft; Endarterectomy of Right Below Knee Popliteal Artery and Tibial Peroneal Trunk with Bovine Pericardium Patch Angioplasty.;  Surgeon: Angelia Mould, MD;  Location: [place];  Service: Vascular;  Laterality: Right;  . FEMORAL-POPLITEAL BYPASS GRAFT Right 01/31/2016   Procedure: BYPASS GRAFT RIGHT COMMON  FEMORALTO BELOW KNEE POPLITEAL ARTERY BYPASS GRAFT USING 6MM PROPATEN GORTEX GRAFT;  Surgeon: Mal Misty, MD;  Location: [place];  Service: Vascular;  Laterality: Right;  . FEMORAL-POPLITEAL BYPASS GRAFT Right 01/09/2017   Procedure: THROMBECTOMY OF RIGHT FEMORAL-POPLITEAL  ARTERY BYPASS GRAFT; THROMBECTOMY RIGHT  TIBIAL VESSELS;  Surgeon: [name], MD;  Location: [facility];  Service: Vascular;  Laterality: Right;  . FEMORAL-POPLITEAL BYPASS GRAFT Right 05/17/2017   Procedure: RIGHT FEMORAL-TIBIAL PERONEAL TRUNK ARTERY BYPASS GRAFT USING 63mX80cm PROPATEN GRAFT WITH REMOVABLE RING;  Surgeon: ERosetta Posner MD;  [place]  Service: Vascular;  Laterality: Right;  . FRACTURE SURGERY    . ILIAC ARTERY STENT  07/2006   external iliac and Rt SFA stent 07/2006 AND THE LEFT WAS IN 2006  . KNEE ARTHROSCOPY WITH MEDIAL MENISECTOMY Left 03/27/2014   Procedure: left knee arthorscopy with medial chondraplasty of the medial femoral and patella, medial microfracture technique of medial femoral condyl;  Surgeon: RTobi Bastos MD;   Location: [facility];  Service: Orthopedics;  Laterality: Left;  . LEFT HEART CATHETERIZATION WITH CORONARY ANGIOGRAM Right 04/27/2013   Procedure: LEFT HEART CATHETERIZATION WITH CORONARY ANGIOGRAM;  Surgeon: DLeonie Man MD;  [facility];  Service: Cardiovascular;  Laterality: Right;  . LOWER EXTREMITY ANGIOGRAM Right 01/31/2016   Procedure: INTRAOP RIGHT LOWER EXTREMITY ANGIOGRAM;  Surgeon: JMal Misty MD;  Location: [facility]  Service: Vascular;  Laterality: Right;  . ORIF WRIST FRACTURE Right 1983  . OVARIAN CYST REMOVAL Right   . PATCH ANGIOPLASTY Right 01/31/2016   Procedure: RIGHT COMMON FEMORAL AND PROFUNDA FEMORIS ENDARECTOMY WITH PATCH ANGIOPLASTY;  Surgeon: JMal Misty MD;  [place] [facility]  Service: Vascular;  Laterality: Right;  . PATCH ANGIOPLASTY Right 01/09/2017   Procedure: PATCH ANGIOPLASTY RIGHT POPLITEAL ARTERY BYPASS GRAFT;  Surgeon: CElam Dutch MD;  [place] [facility]  Service: Vascular;  Laterality: Right;  . PERIPHERAL VASCULAR CATHETERIZATION N/A 06/27/2015   Procedure: Lower Extremity Angiography;  Surgeon: JLorretta Harp MD;  [facility];  Service: Cardiovascular;  Laterality: N/A;  . PERIPHERAL VASCULAR CATHETERIZATION Bilateral 01/29/2016   Procedure: Lower Extremity Angiography;  Surgeon: MWellington Hampshire MD;  [place];  Service: Cardiovascular;  Laterality: Bilateral;  . PERIPHERAL VASCULAR CATHETERIZATION N/A 01/29/2016   Procedure: Abdominal Aortogram;  Surgeon: Wellington Hampshire, MD;  Location: [place] CV LAB;  Service: Cardiovascular;  Laterality: N/A;  . REFRACTIVE SURGERY Bilateral    "6 in one eye; 7 in the other; to relieve pressure; not glaucoma" (01/28/2016)  . SFA Right 06/27/2015   overlapping Bahn covered stents  . TUBAL LIGATION  1983  . VEIN HARVEST Left 05/17/2017   Procedure: LEFT LEG GREATER SAPHENOUS VEIN HARVEST;  Surgeon: Rosetta Posner, MD;  Location: [place];  Service: Vascular;  Laterality:  Left;  Marland Kitchen VEIN REPAIR Right 01/31/2016   Procedure: RIGHT GREATER SAPHENOUS VEIN EXAMNED BUT NOT REMOVED;  Surgeon: Mal Misty, MD;  [facility];  Service: Vascular;  Laterality: Right;    Assessment & Plan Clinical Impression: Patient is a 66 y.o. year old female with recent admission to the hospital on 05/20/2017 with right lower extremity ischemic changes and rest pain that progressed over the past week. Underwent right femoral to tibioperoneal trunk bypass 05/17/2017 per Dr. Donnetta Hutching. Postoperative pain management with ongoing progressive ischemic changes and limb was not felt to be salvaged. Underwent right AKA 05/21/2017 per Dr. Donnetta Hutching.Eliquis resume postoperatively.Acute blood loss anemia 8.3 and monitored  Patient transferred to CIR on 05/25/2017 .    Patient currently requires min with basic self-care skills secondary to muscle weakness and decreased standing balance and decreased balance strategies.  Prior to hospitalization, patient could complete ADLs with independent .  Patient will benefit from skilled intervention to decrease level of assist with basic self-care skills and increase independence with basic self-care skills prior to discharge home with PRN assistance from outside people/sources.  Anticipate patient will require 24 hour supervision and follow up home health.  OT - End of Session Activity Tolerance: Tolerates 30+ min activity with multiple rests Endurance Deficit: Yes OT Assessment Rehab Potential (ACUTE ONLY): Excellent Barriers to Discharge: Decreased caregiver support Barriers to Discharge Comments: Granddaughter is in [place] at this time. OT Patient demonstrates impairments in the following area(s): Balance;Pain;Endurance OT Basic ADL's Functional Problem(s): Grooming;Bathing;Dressing;Toileting OT Advanced ADL's Functional Problem(s): Simple Meal Preparation;Laundry;Light Housekeeping OT Transfers Functional Problem(s): Tub/Shower;Toilet OT Additional  Impairment(s): None OT Plan OT Intensity: Minimum of 1-2 x/day, 45 to 90 minutes OT Frequency: 5 out of 7 days OT Duration/Estimated Length of Stay: 7-9 days OT Treatment/Interventions: Balance/vestibular training;Community reintegration;DME/adaptive equipment instruction;Discharge planning;Pain management;Patient/family education;Functional mobility training;Neuromuscular re-education;Therapeutic Exercise;Therapeutic Activities;Self Care/advanced ADL retraining;UE/LE Coordination activities;UE/LE Strength taining/ROM OT Self Feeding Anticipated Outcome(s): independent OT Basic Self-Care Anticipated Outcome(s): modified indepnendent OT Toileting Anticipated Outcome(s): modified independent OT Bathroom Transfers Anticipated Outcome(s): modified independent OT Recommendation Patient destination: Home Follow Up Recommendations: Home health OT Equipment Recommended: Tub/shower bench   Skilled Therapeutic Intervention Pt completed selfcare retraining sit to stand at the sink as well as toilet transfers during session.  Min assist for all sit to stand transitions with pt reporting increased pain in the RLE at times.  Re-emphasized need to continue desensitization of the LLE during session for pain.  Min assist for stand pivot transfers from wheelchair to the 3:1 over the toilet.  Discussed ELOS and current anticipated modified independent level goals.      OT Evaluation Precautions/Restrictions  Precautions Precautions: Fall Restrictions Weight Bearing Restrictions: Yes RLE Weight Bearing: Non weight bearing  Pain Pain Assessment Pain Assessment: 0-10 Pain Score: 9  Pain Type: Surgical pain Pain Location: Leg Pain Orientation: Right Pain  Descriptors / Indicators: Aching Pain Frequency: Constant Home Living/Prior Functioning Home Living Family/patient expects to be discharged to:: Private residence Living Arrangements: Other relatives Available Help at Discharge: Available  PRN/intermittently Type of Home: Apartment Home Access: Stairs to enter [employer] of Steps: 4 Entrance Stairs-Rails: Right, Left Home Layout: One level Bathroom Shower/Tub: Tub/shower unit, Architectural technologist: Standard Bathroom Accessibility: Yes Additional Comments: pt uses BSC in tub shower and states she can sit on BSC and then swing legs into tub from there.   Lives With: Family (Lives with granddaughter but she is out of town now) IADL History Homemaking Responsibilities: Yes Meal Prep Responsibility: Therapist, occupational Responsibility: Primary Cleaning Responsibility: Primary Shopping Responsibility: Primary Current License: Yes Occupation: Retired Prior Function Level of Independence: Independent with basic ADLs  Able to [place]?: Reciprically Driving: Yes Vocation: On disability Leisure: Hobbies-yes (Comment) Comments: Likes to garden ADL  See Function Section of chart for details Vision Baseline Vision/History: Wears glasses Wears Glasses: Reading only Patient Visual Report: No change from baseline Vision Assessment?: No apparent visual deficits Eye Alignment: Within Functional Limits Perception  Perception: Within Functional Limits Praxis Praxis: Intact Cognition Overall Cognitive Status: Within Functional Limits for tasks assessed Arousal/Alertness: Awake/alert Orientation Level: Person;Place;Situation Person: Oriented Place: Oriented Situation: Oriented Year: 2018 Month: June Day of Week: Correct Memory: Appears intact Immediate Memory Recall: Sock;Blue;Bed Memory Recall: Sock;Blue;Bed Memory Recall Sock: Without Cue Memory Recall Blue: Without Cue Memory Recall Bed: Without Cue Attention: Sustained;Selective Sustained Attention: Appears intact Selective Attention: Appears intact Awareness: Appears intact Problem Solving: Appears intact Safety/Judgment: Appears intact Sensation Sensation Light Touch: Appears  Intact Stereognosis: Appears Intact Hot/Cold: Appears Intact Proprioception: Appears Intact Additional Comments: Sensation intact in BUEs Coordination Gross Motor Movements are Fluid and Coordinated: Yes Fine Motor Movements are Fluid and Coordinated: Yes Motor  Motor Motor: Within Functional Limits Mobility  Bed Mobility Bed Mobility: Supine to Sit Supine to Sit: 5: Supervision;HOB elevated;With rails Transfers Transfers: Sit to Stand;Stand to Sit Sit to Stand: With upper extremity assist;From chair/3-in-1;4: Min assist Stand to Sit: 4: Min assist;With upper extremity assist;To chair/3-in-1  Trunk/Postural Assessment  Cervical Assessment Cervical Assessment: Within Functional Limits Thoracic Assessment Thoracic Assessment: Within Functional Limits Lumbar Assessment Lumbar Assessment: Within Functional Limits Postural Control Postural Control: Within Functional Limits  Balance Balance Balance Assessed: Yes Static Sitting Balance Static Sitting - Balance Support: No upper extremity supported Static Sitting - Level of Assistance: 7: Independent Dynamic Sitting Balance Dynamic Sitting - Balance Support: During functional activity Dynamic Sitting - Level of Assistance: 5: Stand by assistance Static Standing Balance Static Standing - Balance Support: During functional activity;Bilateral upper extremity supported Static Standing - Level of Assistance: 5: Stand by assistance Dynamic Standing Balance Dynamic Standing - Balance Support: During functional activity;Bilateral upper extremity supported Dynamic Standing - Level of Assistance: 4: Min assist Extremity/Trunk Assessment RUE Assessment RUE Assessment: Within Functional Limits LUE Assessment LUE Assessment: Within Functional Limits   See Function Navigator for Current Functional Status.   Refer to Care Plan for Long Term Goals  Recommendations for other services: None    Discharge Criteria: Patient will be  discharged from OT if patient refuses treatment 3 consecutive times without medical reason, if treatment goals not met, if there is a change in medical status, if patient makes no progress towards goals or if patient is discharged from hospital.  The above assessment, treatment plan, treatment alternatives and goals were discussed and mutually agreed upon: by patient  Alicya Bena OTR/L 05/26/2017, 3:53 PM

## 2017-05-26 NOTE — Progress Notes (Signed)
Social Work Assessment and Plan Social Work Assessment and Plan  Patient Details  Name: [name] MRN: [id number] Date of Birth: 01/03/1951  Today's Date: 05/26/2017  Problem List:  Patient Active Problem List   Diagnosis Date Noted  . Hypoalbuminemia due to protein-calorie malnutrition ([place])   . Amputee, above knee, right ([place]) 05/25/2017  . Unilateral AKA, right ([place])   . Elevated troponin   . PVD (peripheral vascular disease) ([place])   . Benign essential HTN   . Acute blood loss anemia   . Ischemia of right lower extremity 05/17/2017  . Thrombosis of right femoral artery ([place]) 01/09/2017  . Ischemia of extremity 01/09/2017  . History of colonic polyps 07/30/2016  . Essential hypertension 01/08/2016  . Polycythemia vera ([place]) 08/20/2015  . Leukocytosis 07/15/2015  . S/P arterial stent right SFA overlapping Bahn covered stents 06/27/2015 06/28/2015  . Claudication ([place]) 06/27/2015  . Carotid artery disease ([place]) 08/24/2014  . Osteoarthritis of left knee 03/27/2014  . External hemorrhoids 06/06/2013  . PAF (paroxysmal atrial fibrillation), maintaing SR 05/11/2013  . Chronic anticoagulation, new- eliquis 05/11/2013  . CAD (coronary artery disease), per cath 04/27/13, non obstructive disease 20-30% LM; LAD 30%; 50-60% OM2; RCA 40%; EF 65-70% 05/11/2013  . Mitral regurgitation,2+ by cath 05/11/2013  . Atrial fibrillation with RVR, hx of PAF previously 04/27/2013  . PAD (peripheral artery disease), Lt carotid 50-70%: , Hx stent Lt exteral iliac & RSFA stent, known occl. LSFA  04/27/2013  . Hyperlipidemia 04/27/2013  . History of CVA (cerebrovascular accident) 04/27/2013  . NSTEMI (non-ST elevated myocardial infarction) ([place]) 04/27/2013   Past Medical History:  Past Medical History:  Diagnosis Date  . Arthritis    "fingers, some in my knees" (01/28/2016)  . CAD (coronary artery disease), per cath 04/27/13, non obstructive disease, EF 65-70% 05/11/2013  . Carotid disease, bilateral  ([place]) 09/2013   Lt carotid 50-69% stentosis, less on rt  . Dysrhythmia   . Fatty liver   . GERD (gastroesophageal reflux disease)   . Glaucoma   . H/O cardiovascular stress test 12/27/2009   negative for ischemia  . Headache    "occasional since eye pressure regulated" (01/28/2016)  . Hyperlipidemia   . Hypertension   . Leukocytosis 07/15/2015  . Migraine    "stopped w/laser holes to relieve pressure in my eyes; had them 3-4 times/wk before taht" (01/28/2016)  . Mitral regurgitation,2+ by cath 05/11/2013  . NSTEMI (non-ST elevated myocardial infarction) ([place]) 04/27/2013  . Pain    BOTH KNEES - PT HAS TORN MENISCUS LEFT KNEE  . Paroxysmal atrial fibrillation ([facility])    hx of a fib on ASA  AND ELIQUIS   . Polycythemia vera ([place]) 08/20/2015   JAK-2 positive 07/19/15  . PVD (peripheral vascular disease) with claudication ([place]) 07/2006   previous L ext iliac stent and rt SFA stent, known occluded Lt SFA  . S/P cardiac cath 04/27/13   NON OBSTRUCTIVE DISEASE, 2+ mr, ef 65-70%  . Statin intolerance   . Stroke ([place]) 2006   SWELLING ALL OVER AND RT SIDE OF FACE DRAWN AND SPEECH SLURRED AND NUMBNESS ON RIGHT SIDE-- ALL RESOLVED   Past Surgical History:  Past Surgical History:  Procedure Laterality Date  . AMPUTATION Right 05/21/2017   Procedure: AMPUTATION ABOVE KNEE;  Surgeon: Rosetta Posner, MD;  Location: [facility];  Service: Vascular;  Laterality: Right;  . CARDIAC CATHETERIZATION  04/27/13   non occlusive disease with mild to mod. calcified lesions in ostial LM  and proximal LAD and moderate prox. RC AND DISTAL AV GROOVE LCX, ef  . CATARACT EXTRACTION W/PHACO Right 03/21/2015   Procedure: CATARACT EXTRACTION PHACO AND INTRAOCULAR LENS PLACEMENT RIGHT EYE;  Surgeon: Baruch Goldmann, MD;  Location: [facility];  Service: Ophthalmology;  Laterality: Right;  CDE:5.10  . CATARACT EXTRACTION W/PHACO Left 05/16/2015   Procedure: CATARACT EXTRACTION PHACO AND INTRAOCULAR LENS PLACEMENT (IOC);  Surgeon: Baruch Goldmann, MD;   Location: [facility];  Service: Ophthalmology;  Laterality: Left;  CDE 5.57  . [place]  . COLONOSCOPY N/A 09/11/2016   Procedure: COLONOSCOPY;  Surgeon: Rogene Houston, MD;  [facility];  Service: Endoscopy;  Laterality: N/A;  730-moved to 1:00 Ann notified pt  . EMBOLECTOMY Right 02/01/2016   Procedure: Thrombectomy of Right Femoral-Popliteal Bypass Graft; Endarterectomy of Right Below Knee Popliteal Artery and Tibial Peroneal Trunk with Bovine Pericardium Patch Angioplasty.;  Surgeon: Angelia Mould, MD;  Location: [facility];  Service: Vascular;  Laterality: Right;  . FEMORAL-POPLITEAL BYPASS GRAFT Right 01/31/2016   Procedure: BYPASS GRAFT RIGHT COMMON  FEMORALTO BELOW KNEE POPLITEAL ARTERY BYPASS GRAFT USING 6MM PROPATEN GORTEX GRAFT;  Surgeon: Mal Misty, MD;  Location: [facility];  Service: Vascular;  Laterality: Right;  . FEMORAL-POPLITEAL BYPASS GRAFT Right 01/09/2017   Procedure: THROMBECTOMY OF RIGHT FEMORAL-POPLITEAL  ARTERY BYPASS GRAFT; THROMBECTOMY RIGHT  TIBIAL VESSELS;  Surgeon: [name], MD;  Location: [facility];  Service: Vascular;  Laterality: Right;  . FEMORAL-POPLITEAL BYPASS GRAFT Right 05/17/2017   Procedure: RIGHT FEMORAL-TIBIAL PERONEAL TRUNK ARTERY BYPASS GRAFT USING 63mmX80cm PROPATEN GRAFT WITH REMOVABLE RING;  Surgeon: Rosetta Posner, MD;  Location: [facility];  Service: Vascular;  Laterality: Right;  . FRACTURE SURGERY    . ILIAC ARTERY STENT  07/2006   external iliac and Rt SFA stent 07/2006 AND THE LEFT WAS IN 2006  . KNEE ARTHROSCOPY WITH MEDIAL MENISECTOMY Left 03/27/2014   Procedure: left knee arthorscopy with medial chondraplasty of the medial femoral and patella, medial microfracture technique of medial femoral condyl;  Surgeon: Tobi Bastos, MD;  Location: [facility];  Service: Orthopedics;  Laterality: Left;  . LEFT HEART CATHETERIZATION WITH CORONARY ANGIOGRAM Right 04/27/2013   Procedure: LEFT HEART CATHETERIZATION WITH CORONARY ANGIOGRAM;   Surgeon: Leonie Man, MD;  [facility];  Service: Cardiovascular;  Laterality: Right;  . LOWER EXTREMITY ANGIOGRAM Right 01/31/2016   Procedure: INTRAOP RIGHT LOWER EXTREMITY ANGIOGRAM;  Surgeon: Mal Misty, MD;  Location: [facility];  Service: Vascular;  Laterality: Right;  . ORIF WRIST FRACTURE Right 1983  . OVARIAN CYST REMOVAL Right   . PATCH ANGIOPLASTY Right 01/31/2016   Procedure: RIGHT COMMON FEMORAL AND PROFUNDA FEMORIS ENDARECTOMY WITH PATCH ANGIOPLASTY;  Surgeon: Mal Misty, MD;  Location: [facility];  Service: Vascular;  Laterality: Right;  . PATCH ANGIOPLASTY Right 01/09/2017   Procedure: PATCH ANGIOPLASTY RIGHT POPLITEAL ARTERY BYPASS GRAFT;  Surgeon: [name], MD;  Location: [facility];  Service: Vascular;  Laterality: Right;  . PERIPHERAL VASCULAR CATHETERIZATION N/A 06/27/2015   Procedure: Lower Extremity Angiography;  Surgeon: Lorretta Harp, MD;  Location: [facility] CV LAB;  Service: Cardiovascular;  Laterality: N/A;  . PERIPHERAL VASCULAR CATHETERIZATION Bilateral 01/29/2016   Procedure: Lower Extremity Angiography;  Surgeon: Wellington Hampshire, MD;  Location: [facility] CV LAB;  Service: Cardiovascular;  Laterality: Bilateral;  . PERIPHERAL VASCULAR CATHETERIZATION N/A 01/29/2016   Procedure: Abdominal Aortogram;  Surgeon: Wellington Hampshire, MD;  Location[facility]  INVASIVE CV LAB;  Service: Cardiovascular;  Laterality: N/A;  . REFRACTIVE SURGERY Bilateral    "6 in one eye; 7 in the other; to relieve pressure; not glaucoma" (01/28/2016)  . SFA Right 06/27/2015   overlapping Bahn covered stents  . TUBAL LIGATION  1983  . VEIN HARVEST Left 05/17/2017   Procedure: LEFT LEG GREATER SAPHENOUS VEIN HARVEST;  Surgeon: Rosetta Posner, MD;  Location: [facility];  Service: Vascular;  Laterality: Left;  [place] Kitchen VEIN REPAIR Right 01/31/2016   Procedure: RIGHT GREATER SAPHENOUS VEIN EXAMNED BUT NOT REMOVED;  Surgeon: Mal Misty, MD;  Location: [facility];  Service: Vascular;  Laterality: Right;    Social History:  reports that she quit smoking about 4 years ago. Her smoking use included Cigarettes. She has a 45.00 pack-year smoking history. She has never used smokeless tobacco. She reports that she does not drink alcohol or use drugs.  Family / Support Systems Marital Status: Widow/Widower Patient Roles: Parent, Caregiver Children: Scientist, forensic [phone] [place]) Other Supports: Vanessa Barbara [phone]   Peggy Adams-sister [phone]  [phone] Anticipated Caregiver: Jana Half will be checking in on and neighbors, along with friends Ability/Limitations of Caregiver: Jilda Roche can check can see if sister willing to stay for a few days at discharge for the transition home Caregiver Availability: Intermittent Family Dynamics: Close knit family pt has two children who are out of state. her 67 yo Curator lives with her but is at her Mom's for a month in [place]. Pt will have neighbors and friends checking daily on her at discharge.  Social History Preferred language: English Religion: Christian Cultural Background: No issues Education: High School-CNA classes Read: Yes Write: Yes Employment Status: Retired Freight forwarder Issues: No issues Guardian/Conservator: none-according to MD pt is capable of making her own decisons while here.    Abuse/Neglect Physical Abuse: Denies Verbal Abuse: Denies Sexual Abuse: Denies Exploitation of patient/patient's resources: Denies Self-Neglect: Denies  Emotional Status Pt's affect, behavior adn adjustment status: Pt is motviated and ready to work in therapies. She has always been the caregiver's of others and is not used to this role. She wants to be able to do as much as she can for herself before going home. She has many friends who will be checking in on her at home Recent Psychosocial Issues: other health issues-this could not be avoided which pt is dealing with Pyschiatric History:  No history-issues, may benefit from seeing neuro-psych while here due to issues with her grandaughter and now losing her leg. She is able to verbalize about it and seems to be moving forward in her recovery from this. Substance Abuse History: Tobacco unsure if will quit but aware she needs too and is aware of the resources available for this  Patient / Family Perceptions, Expectations & Goals Pt/Family understanding of illness & functional limitations: Pt is able to explain her treatment plan and feels her questions and concerns are being addressed by the MD. She is not one to shy away from asking what is on her mind. Daughter is in contact with the MD also and pt daily. Premorbid pt/family roles/activities: Mom, grandmother, widow, retiree, neighbor and friend Anticipated changes in roles/activities/participation: resume Pt/family expectations/goals: Pt states: " I want to be able to do as much as I can for myself by the time I leave here."  " I have good friends willing to help me also."  [employer]: None Premorbid Home Care/DME Agencies: Other (Comment) (has RW, BSC, Quad cane  from before) Transportation available at discharge: Friends, sister's Resource referrals recommended: Neuropsychology, Support group (specify)  Discharge Planning Living Arrangements: Other relatives Support Systems: Children, Other relatives, Friends/neighbors Type of Residence: Private residence Insurance Resources: [employer] (specify county), Multimedia programmer (specify) (UHC_Medicare) Financial Resources: Radio broadcast assistant Screen Referred: No Living Expenses: Own Money Management: Patient Does the patient have any problems obtaining your medications?: No Home Management: Patient and friends to assist until she can resume this Patient/Family Preliminary Plans: Return home with intermittent assist from friends/nieghbors. She is hoping she will be mod/i levle before going home  from rehab. She is doing well already and is very motivated to begin therapies. Await therapies evaluations and work on a safe plan for her. 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 She has sister's, friends and neighbors willing to assist and do what she needs at discharge. Will await therapy evaluations and work on a safe discharge plan.  Elease Hashimoto 05/26/2017, 9:35 AM

## 2017-05-26 NOTE — Progress Notes (Signed)
[facility] PHYSICAL MEDICINE & REHABILITATION     PROGRESS NOTE  Subjective/Complaints:  Pt seen sitting up in bed this AM about to work with therapies.  She slept well overnight.  She requests changing of timing of her pain meds. She notes phantom sensation, but no pain.  ROS: Denies CP, SOB, N/V/D.  Objective: Vital Signs: Blood pressure (!) 170/53, pulse 80, temperature 98.5 F (36.9 C), temperature source Oral, resp. rate 18, height 5\' 5"  (1.651 m), weight 58.6 kg (129 lb 3.2 oz), SpO2 99 %. No results found.  Recent Labs  05/24/17 0522 05/26/17 0619  WBC 15.5* 18.8*  HGB 8.3* 9.3*  HCT 29.2* 32.2*  PLT 353 456*    Recent Labs  05/26/17 0619  NA 135  K 3.5  CL 99*  GLUCOSE 111*  BUN 5*  CREATININE 0.63  CALCIUM 8.3*   CBG (last 3)  No results for input(s): GLUCAP in the last 72 hours.  Wt Readings from Last 3 Encounters:  05/25/17 58.6 kg (129 lb 3.2 oz)  05/21/17 66.7 kg (147 lb)  04/22/17 63.5 kg (140 lb)    Physical Exam:  BP (!) 170/53 (BP Location: Left Arm)   Pulse 80   Temp 98.5 F (36.9 C) (Oral)   Resp 18   Ht 5\' 5"  (1.651 m)   Wt 58.6 kg (129 lb 3.2 oz)   SpO2 99%   BMI 21.50 kg/m  Constitutional: She appears well-developed. NAD. HENT: Normocephalic. Atraumatic Eyes: EOM are normal. No discharge.  Cardiovascular: RRR. No JVD. Respiratory: Effort normal and breath sounds normal.  GI: Soft. Bowel sounds are normal.  Musculoskeletal:  Right AKA Neurological: She is alert and oriented.  Motor: B/L UE 5/5 proximal to distal LLE: 4+/5 (with some pain inhibition).  RLE: HF 3/5 (pain inhibition)  Skin:  Incision clean/dry/intact. Graft sites c/d/i  Psychiatric: She has a normal mood and affect. Her behavior is normal.   Assessment/Plan: 1. Functional deficits secondary to right AKA which require 3+ hours per day of interdisciplinary therapy in a comprehensive inpatient rehab setting. Physiatrist is providing close team supervision and 24  hour management of active medical problems listed below. Physiatrist and rehab team continue to assess barriers to discharge/monitor patient progress toward functional and medical goals.  Function:  Bathing Bathing position      Bathing parts      Bathing assist        Upper Body Dressing/Undressing Upper body dressing                    Upper body assist        Lower Body Dressing/Undressing Lower body dressing                                  Lower body assist        Toileting Toileting   Toileting steps completed by patient: Adjust clothing prior to toileting, Performs perineal hygiene, Adjust clothing after toileting      Toileting assist Assist level: Set up/obtain supplies   Transfers Chair/bed transfer             Locomotion Ambulation           Wheelchair          Cognition Comprehension Comprehension assist level: Follows complex conversation/direction with extra time/assistive device  Expression Expression assist level: Expresses complex ideas: With no assist  Technical sales engineer Social  Interaction assist level: Interacts appropriately with others - No medications needed.  Problem Solving Problem solving assist level: Solves complex problems: Recognizes & self-corrects  Memory Memory assist level: Complete Independence: No helper    Medical Problem List and Plan:  1. Decreased functional mobility secondary to right AKA 05/21/2017 as well as PVD with multiple revascularization procedures   Begin CIR 2. DVT Prophylaxis/Anticoagulation: Eliquis resumed postoperatively  3. Pain Management: Ultram as needed  4. Mood: Provide emotional support  5. Neuropsych: This patient is capable of making decisions on her own behalf.  6. Skin/Wound Care: Routine skin checks  7. Fluids/Electrolytes/Nutrition: Routine I&Os 8. Atrial fibrillation. Cardiac rate controlled. Continue Eliquis  9. Hypertension. Cardizem CD 180 mg daily, Tambocor  50 mg twice a day, lisinopril 20 mg daily.   Monitor with increased mobility  10. CAD/NSTEMI. Continue aspirin. No chest pain or shortness of breath  11. ?Polycythemia vera: Follow labs 12. GERD. Protonix 13. Hypoalbuminemia  Supplement initiated 6/6 14. Leukocytosis  WBCs 18.8 on 6/6  Afebrile  Cont to monitor 15. ABLA  Hb 9.3 on 6/6  Cont to monitor   LOS (Days) 1 A FACE TO FACE EVALUATION WAS PERFORMED  Noorah Giammona Lorie Phenix 05/26/2017 8:59 AM

## 2017-05-26 NOTE — Progress Notes (Signed)
Physical Therapy Assessment and Plan  Patient Details  Name: [name] MRN: [id number] Date of Birth: 1951-06-23  PT Diagnosis: Abnormal posture, Abnormality of gait, Difficulty walking and Muscle weakness Rehab Potential: Excellent ELOS: 7-9   Today's Date: 05/26/2017 PT Individual Time: 1300-1400 PT Individual Time Calculation (min): 60 min    Problem List:  Patient Active Problem List   Diagnosis Date Noted  . Hypoalbuminemia due to protein-calorie malnutrition ([place])   . Amputee, above knee, right ([place]) 05/25/2017  . Unilateral AKA, right ([place])   . Elevated troponin   . PVD (peripheral vascular disease) ([place])   . Benign essential HTN   . Acute blood loss anemia   . Ischemia of right lower extremity 05/17/2017  . Thrombosis of right femoral artery ([place]) 01/09/2017  . Ischemia of extremity 01/09/2017  . History of colonic polyps 07/30/2016  . Essential hypertension 01/08/2016  . Polycythemia vera ([place]) 08/20/2015  . Leukocytosis 07/15/2015  . S/P arterial stent right SFA overlapping Bahn covered stents 06/27/2015 06/28/2015  . Claudication ([place]) 06/27/2015  . Carotid artery disease ([employer]) 08/24/2014  . Osteoarthritis of left knee 03/27/2014  . External hemorrhoids 06/06/2013  . PAF (paroxysmal atrial fibrillation), maintaing SR 05/11/2013  . Chronic anticoagulation, new- eliquis 05/11/2013  . CAD (coronary artery disease), per cath 04/27/13, non obstructive disease 20-30% LM; LAD 30%; 50-60% OM2; RCA 40%; EF 65-70% 05/11/2013  . Mitral regurgitation,2+ by cath 05/11/2013  . Atrial fibrillation with RVR, hx of PAF previously 04/27/2013  . PAD (peripheral artery disease), Lt carotid 50-70%: , Hx stent Lt exteral iliac & RSFA stent, known occl. LSFA  04/27/2013  . Hyperlipidemia 04/27/2013  . History of CVA (cerebrovascular accident) 04/27/2013  . NSTEMI (non-ST elevated myocardial infarction) ([place]) 04/27/2013    Past Medical History:  Past Medical History:  Diagnosis Date   . Arthritis    "fingers, some in my knees" (01/28/2016)  . CAD (coronary artery disease), per cath 04/27/13, non obstructive disease, EF 65-70% 05/11/2013  . Carotid disease, bilateral ([place]) 09/2013   Lt carotid 50-69% stentosis, less on rt  . Dysrhythmia   . Fatty liver   . GERD (gastroesophageal reflux disease)   . Glaucoma   . H/O cardiovascular stress test 12/27/2009   negative for ischemia  . Headache    "occasional since eye pressure regulated" (01/28/2016)  . Hyperlipidemia   . Hypertension   . Leukocytosis 07/15/2015  . Migraine    "stopped w/laser holes to relieve pressure in my eyes; had them 3-4 times/wk before taht" (01/28/2016)  . Mitral regurgitation,2+ by cath 05/11/2013  . NSTEMI (non-ST elevated myocardial infarction) ([place]) 04/27/2013  . Pain    BOTH KNEES - PT HAS TORN MENISCUS LEFT KNEE  . Paroxysmal atrial fibrillation (H[facility])    hx of a fib on ASA  AND ELIQUIS   . Polycythemia vera ([place]) 08/20/2015   JAK-2 positive 07/19/15  . PVD (peripheral vascular disease) with claudication ([place]) 07/2006   previous L ext iliac stent and rt SFA stent, known occluded Lt SFA  . S/P cardiac cath 04/27/13   NON OBSTRUCTIVE DISEASE, 2+ mr, ef 65-70%  . Statin intolerance   . Stroke ([place]) 2006   SWELLING ALL OVER AND RT SIDE OF FACE DRAWN AND SPEECH SLURRED AND NUMBNESS ON RIGHT SIDE-- ALL RESOLVED   Past Surgical History:  Past Surgical History:  Procedure Laterality Date  . AMPUTATION Right 05/21/2017   Procedure: AMPUTATION ABOVE KNEE;  Surgeon: Rosetta Posner, MD;  Location:  [facility];  Service: Vascular;  Laterality: Right;  . CARDIAC CATHETERIZATION  04/27/13   non occlusive disease with mild to mod. calcified lesions in ostial LM and proximal LAD and moderate prox. RC AND DISTAL AV GROOVE LCX, ef  . CATARACT EXTRACTION W/PHACO Right 03/21/2015   Procedure: CATARACT EXTRACTION PHACO AND INTRAOCULAR LENS PLACEMENT RIGHT EYE;  Surgeon: Baruch Goldmann, MD;  Location: [facility]S;  Service: Ophthalmology;   Laterality: Right;  CDE:5.10  . CATARACT EXTRACTION W/PHACO Left 05/16/2015   Procedure: CATARACT EXTRACTION PHACO AND INTRAOCULAR LENS PLACEMENT (IOC);  Surgeon: Baruch Goldmann, MD;  Location: [facility];  Service: Ophthalmology;  Laterality: Left;  CDE 5.57  . [place]  . COLONOSCOPY N/A 09/11/2016   Procedure: COLONOSCOPY;  Surgeon: Rogene Houston, MD;  Location: [facility]ITE;  Service: Endoscopy;  Laterality: N/A;  730-moved to 1:00 Ann notified pt  . EMBOLECTOMY Right 02/01/2016   Procedure: Thrombectomy of Right Femoral-Popliteal Bypass Graft; Endarterectomy of Right Below Knee Popliteal Artery and Tibial Peroneal Trunk with Bovine Pericardium Patch Angioplasty.;  Surgeon: Angelia Mould, MD;  Location: [facility];  Service: Vascular;  Laterality: Right;  . FEMORAL-POPLITEAL BYPASS GRAFT Right 01/31/2016   Procedure: BYPASS GRAFT RIGHT COMMON  FEMORALTO BELOW KNEE POPLITEAL ARTERY BYPASS GRAFT USING 6MM PROPATEN GORTEX GRAFT;  Surgeon: Mal Misty, MD;  Location: [facility];  Service: Vascular;  Laterality: Right;  . FEMORAL-POPLITEAL BYPASS GRAFT Right 01/09/2017   Procedure: THROMBECTOMY OF RIGHT FEMORAL-POPLITEAL  ARTERY BYPASS GRAFT; THROMBECTOMY RIGHT  TIBIAL VESSELS;  Surgeon: [name], MD;  Location: [facility];  Service: Vascular;  Laterality: Right;  . FEMORAL-POPLITEAL BYPASS GRAFT Right 05/17/2017   Procedure: RIGHT FEMORAL-TIBIAL PERONEAL TRUNK ARTERY BYPASS GRAFT USING 83mX80cm PROPATEN GRAFT WITH REMOVABLE RING;  Surgeon: ERosetta Posner MD;  [facility]  Service: Vascular;  Laterality: Right;  . FRACTURE SURGERY    . ILIAC ARTERY STENT  07/2006   external iliac and Rt SFA stent 07/2006 AND THE LEFT WAS IN 2006  . KNEE ARTHROSCOPY WITH MEDIAL MENISECTOMY Left 03/27/2014   Procedure: left knee arthorscopy with medial chondraplasty of the medial femoral and patella, medial microfracture technique of medial femoral condyl;  Surgeon: RTobi Bastos MD;  [facility]: [facility];   Service: Orthopedics;  Laterality: Left;  . LEFT HEART CATHETERIZATION WITH CORONARY ANGIOGRAM Right 04/27/2013   Procedure: LEFT HEART CATHETERIZATION WITH CORONARY ANGIOGRAM;  Surgeon: DLeonie Man MD;  [facility];  Service: Cardiovascular;  Laterality: Right;  . LOWER EXTREMITY ANGIOGRAM Right 01/31/2016   Procedure: INTRAOP RIGHT LOWER EXTREMITY ANGIOGRAM;  Surgeon: JMal Misty MD;  [facility]  Service: Vascular;  Laterality: Right;  . ORIF WRIST FRACTURE Right 1983  . OVARIAN CYST REMOVAL Right   . PATCH ANGIOPLASTY Right 01/31/2016   Procedure: RIGHT COMMON FEMORAL AND PROFUNDA FEMORIS ENDARECTOMY WITH PATCH ANGIOPLASTY;  Surgeon: JMal Misty MD;  [facility]  Service: Vascular;  Laterality: Right;  . PATCH ANGIOPLASTY Right 01/09/2017   Procedure: PATCH ANGIOPLASTY RIGHT POPLITEAL ARTERY BYPASS GRAFT;  Surgeon: CElam Dutch MD;  [place] [facility]  Service: Vascular;  Laterality: Right;  . PERIPHERAL VASCULAR CATHETERIZATION N/A 06/27/2015   Procedure: Lower Extremity Angiography;  Surgeon: JLorretta Harp MD;  [facility];  Service: Cardiovascular;  Laterality: N/A;  . PERIPHERAL VASCULAR CATHETERIZATION Bilateral 01/29/2016   Procedure: Lower Extremity Angiography;  Surgeon: MWellington Hampshire MD;  [facility];  Service: Cardiovascular;  Laterality: Bilateral;  . PERIPHERAL VASCULAR CATHETERIZATION N/A 01/29/2016   Procedure: Abdominal Aortogram;  Surgeon: Wellington Hampshire, MD;  Location: [facility];  Service: Cardiovascular;  Laterality: N/A;  . REFRACTIVE SURGERY Bilateral    "6 in one eye; 7 in the other; to relieve pressure; not glaucoma" (01/28/2016)  . SFA Right 06/27/2015   overlapping Bahn covered stents  . TUBAL LIGATION  1983  . VEIN HARVEST Left 05/17/2017   Procedure: LEFT LEG GREATER SAPHENOUS VEIN HARVEST;  Surgeon: Rosetta Posner, MD;  Location: [facility];  Service: Vascular;  Laterality: Left;  Marland Kitchen VEIN REPAIR  Right 01/31/2016   Procedure: RIGHT GREATER SAPHENOUS VEIN EXAMNED BUT NOT REMOVED;  Surgeon: Mal Misty, MD;  [facility];  Service: Vascular;  Laterality: Right;    Assessment & Plan Clinical Impression: Patient is a 66 y.o. right handed female with history of polycythemia vera diagnosed July 2016 followed by Dr. Beryle Beams, remote tobacco abuse, CAD/NSTEMI, hypertension, atrial fibrillation maintained on Eliquis, peripheral vascular disease with multiple revascularization procedures. Per chart review and patient,patient lives with 28 year old granddaughter. Independent with a walker prior to admission. Granddaughter has just completed school and can assist. Patient also has a local elderly sister. One level home with 4 steps to entry. Presented 05/20/2017 with right lower extremity ischemic changes and rest pain that progressed over the past week. Underwent right femoral to tibioperoneal trunk bypass 05/17/2017 per Dr. Donnetta Hutching. Postoperative pain management with ongoing progressive ischemic changes and limb was not felt to be salvaged. Underwent right AKA 05/21/2017 per Dr. Donnetta Hutching.Eliquis resume postoperatively.Acute blood loss anemia 8.3 and monitored. Mildly elevated troponin 0.2 for follow-up conservatively by cardiology services. Physical and occupational therapy evaluations resumed postoperatively with recommendations of physical medicine rehabilitation consult. Patient was admitted for a comprehensive rehabilitation program.   Patient currently requires min with mobility secondary to muscle weakness and decreased sitting balance and decreased standing balance.  Prior to hospitalization, patient was independent  with mobility and lived with Family (Lives with granddaughter but she is out of town now) in a [place] home.  Home access is 4Stairs to enter.  Patient will benefit from skilled PT intervention to maximize safe functional mobility and minimize fall risk for planned discharge home with  intermittent assist.  Anticipate patient will benefit from follow up [facility] at discharge.  PT - End of Session Activity Tolerance: Tolerates 30+ min activity with multiple rests Endurance Deficit: Yes PT Assessment Rehab Potential (ACUTE/IP ONLY): Excellent PT Patient demonstrates impairments in the following area(s): Balance;Endurance;Motor;Pain;Safety PT Transfers Functional Problem(s): Bed Mobility;Bed to Chair;Car;Furniture;Floor PT Locomotion Functional Problem(s): Ambulation;Wheelchair Mobility;Stairs PT Plan PT Intensity: Minimum of 1-2 x/day ,45 to 90 minutes PT Frequency: 5 out of 7 days PT Duration Estimated Length of Stay: 7-9 PT Treatment/Interventions: Ambulation/gait training;Balance/vestibular training;Discharge planning;Disease management/prevention;DME/adaptive equipment instruction;Functional mobility training;Neuromuscular re-education;Pain management;Patient/family education;Psychosocial support;Stair training;Therapeutic Activities;Therapeutic Exercise;UE/LE Strength taining/ROM;UE/LE Coordination activities;Wheelchair propulsion/positioning PT Transfers Anticipated Outcome(s): Mod I with assistive device PT Locomotion Anticipated Outcome(s): Mod I with assistive device PT Recommendation Follow Up Recommendations: Home health PT Patient destination: Home Equipment Recommended: Wheelchair (measurements);Wheelchair cushion (measurements) Equipment Details: reports having rolling walker at home  Skilled Therapeutic Intervention Pt in bed upon arrival, agreeable to PT session. Currently pt at supervision level with bed mobility. Transfers performed with min guard with stand pivot from bed and w/c. Pt performing car transfers as well. Ambulation performed with min assist X35 ft with 3 standing breaks due to reports  of fatigue. W/c propulsion also incorporated during session. Following session, pt requesting return to bed. Pt in bed with all needs in reach, bed alarm on.   PT  Evaluation Precautions/Restrictions Precautions Precautions: Fall Restrictions Weight Bearing Restrictions: Yes RLE Weight Bearing: Non weight bearing  Pain Pain Assessment Pain Assessment: 0-10 Pain Score: 9  Pain Type: Surgical pain Pain Location: Leg Pain Orientation: Right Pain Descriptors / Indicators: Aching Pain Frequency: Constant Home Living/Prior Functioning Home Living Available Help at Discharge: Available PRN/intermittently Type of Home: Apartment Home Access: Stairs to enter Entrance Stairs-Number of Steps: 4 Entrance Stairs-Rails: Right;Left Home Layout: One level Bathroom Shower/Tub: Product/process development scientist: Standard Bathroom Accessibility: Yes Additional Comments: pt uses BSC in tub shower and states she can sit on BSC and then swing legs into tub from there.   Lives With: Family (Lives with granddaughter but she is out of town now) Prior Function Level of Independence: Independent with basic ADLs  Able to Take Stairs?: Reciprically Driving: Yes Vocation: On disability Leisure: Hobbies-yes (Comment) Comments: Likes to garden Vision/Perception  Vision - History Baseline Vision: Wears glasses only for reading Patient Visual Report: No change from baseline Vision - Assessment Eye Alignment: Within Functional Limits Perception Perception: Within Functional Limits Praxis Praxis: Intact  Cognition Overall Cognitive Status: Within Functional Limits for tasks assessed Arousal/Alertness: Awake/alert Orientation Level: Oriented to person;Oriented to time;Oriented to place;Oriented to situation Attention: Sustained;Selective Sustained Attention: Appears intact Selective Attention: Appears intact Memory: Appears intact Awareness: Appears intact Problem Solving: Appears intact Safety/Judgment: Appears intact Sensation Sensation Light Touch: Appears Intact Stereognosis: Appears Intact Hot/Cold: Appears Intact Proprioception: Appears  Intact Additional Comments: Sensation intact in BUEs Coordination Gross Motor Movements are Fluid and Coordinated: Yes Fine Motor Movements are Fluid and Coordinated: Yes Motor  Motor Motor: Within Functional Limits  Mobility Bed Mobility Bed Mobility: Supine to Sit Supine to Sit: 5: Supervision;HOB elevated;With rails Transfers Sit to Stand: With upper extremity assist;From chair/3-in-1;4: Min assist Stand to Sit: 4: Min assist;With upper extremity assist;To chair/3-in-1 Locomotion    Pt ambulating 35 ft with rw and touching assistance.  Trunk/Postural Assessment  Cervical Assessment Cervical Assessment: Within Functional Limits Thoracic Assessment Thoracic Assessment: Within Functional Limits Lumbar Assessment Lumbar Assessment: Within Functional Limits Postural Control Postural Control: Within Functional Limits  Balance Balance Balance Assessed: Yes Static Sitting Balance Static Sitting - Balance Support: No upper extremity supported Static Sitting - Level of Assistance: 7: Independent Dynamic Sitting Balance Dynamic Sitting - Balance Support: During functional activity Dynamic Sitting - Level of Assistance: 5: Stand by assistance Static Standing Balance Static Standing - Balance Support: During functional activity;Bilateral upper extremity supported Static Standing - Level of Assistance: 5: Stand by assistance Dynamic Standing Balance Dynamic Standing - Balance Support: During functional activity;Bilateral upper extremity supported Dynamic Standing - Level of Assistance: 4: Min assist Extremity Assessment  RUE Assessment RUE Assessment: Within Functional Limits LUE Assessment LUE Assessment: Within Functional Limits RLE Assessment RLE Assessment: Exceptions to [name] RLE Strength RLE Overall Strength Comments: 3/5 through hip musculature, no resistance due to pain LLE Assessment LLE Assessment: Within Functional Limits   See Function Navigator for Current  Functional Status.   Refer to Care Plan for Long Term Goals  Recommendations for other services: Neuropsych  Discharge Criteria: Patient will be discharged from PT if patient refuses treatment 3 consecutive times without medical reason, if treatment goals not met, if there is a change in medical status, if patient makes no progress towards goals or if patient  is discharged from hospital.  The above assessment, treatment plan, treatment alternatives and goals were discussed and mutually agreed upon: by patient  Linard Millers, 05/26/2017, 4:00 PM

## 2017-05-27 ENCOUNTER — Inpatient Hospital Stay (HOSPITAL_COMMUNITY): Payer: Medicare Other | Admitting: Physical Therapy

## 2017-05-27 ENCOUNTER — Inpatient Hospital Stay (HOSPITAL_COMMUNITY): Payer: Medicare Other | Admitting: Occupational Therapy

## 2017-05-27 DIAGNOSIS — R234 Changes in skin texture: Secondary | ICD-10-CM

## 2017-05-27 DIAGNOSIS — R269 Unspecified abnormalities of gait and mobility: Secondary | ICD-10-CM

## 2017-05-27 MED ORDER — LISINOPRIL 20 MG PO TABS
20.0000 mg | ORAL_TABLET | Freq: Two times a day (BID) | ORAL | Status: DC
  Administered 2017-05-27 – 2017-05-31 (×8): 20 mg via ORAL
  Filled 2017-05-27 (×8): qty 1

## 2017-05-27 NOTE — Progress Notes (Signed)
Occupational Therapy Session Note  Patient Details  Name: [name] MRN: [id number] Date of Birth: 06-06-1951  Today's Date: 05/27/2017 OT Individual Time: 1300-1415 OT Individual Time Calculation (min): 75 min    Short Term Goals: Week 1:  OT Short Term Goal 1 (Week 1): STGs equal to LTGs set at modified independent level overall.  Skilled Therapeutic Interventions/Progress Updates:    Pt completed transfer from wheelchair to bed in order to change her dressing on her residual limb.  She completed all transfers squat pivot from bed to chair and back with supervision and no assistive device.  She was able to complete all dressing change with setup and min instructional cueing for wrapping technique in supine.  Once finished, she transferred to the wheelchair and rolled herself down to the therapy gym for next part of session.  She worked on standing balance while tossing horseshoes with alternating UEs.  She then was able to ambulate to pick them up from the floor with min guard assist.  Also completed stand pivot transfer to the tub bench with min guard assist.  Pt returned to room via wheelchair to conclude session with transfer back to bed.  Call button and phone in reach.   Therapy Documentation Precautions:  Precautions Precautions: Fall Restrictions Weight Bearing Restrictions: Yes RLE Weight Bearing: Non weight bearing  Pain: Pain Assessment Pain Assessment: 0-10 Pain Score: 2  Faces Pain Scale: Hurts little more Pain Type: Surgical pain Pain Location: Leg Pain Orientation: Right Pain Descriptors / Indicators: Discomfort Pain Frequency: Intermittent Pain Onset: With Activity Pain Intervention(s): Repositioned Multiple Pain Sites: No ADL: See Function Navigator for Current Functional Status.   Therapy/Group: Individual Therapy  Thaniel Coluccio OTR/L 05/27/2017, 3:43 PM

## 2017-05-27 NOTE — Progress Notes (Signed)
[place] PHYSICAL MEDICINE & REHABILITATION     PROGRESS NOTE  Subjective/Complaints:  Pt seen working with PT this AM.  She slept well overnight and states she had a good day of therapies yesterday and enjoys being pushed. She requests me to look at her incision site.  ROS: Denies CP, SOB, N/V/D.  Objective: Vital Signs: Blood pressure (!) 181/63, pulse 74, temperature 98.8 F (37.1 C), temperature source Oral, resp. rate 20, height 5\' 5"  (1.651 m), weight 58.9 kg (129 lb 12.8 oz), SpO2 97 %. No results found.  Recent Labs  05/26/17 0619  WBC 18.8*  HGB 9.3*  HCT 32.2*  PLT 456*    Recent Labs  05/26/17 0619  NA 135  K 3.5  CL 99*  GLUCOSE 111*  BUN 5*  CREATININE 0.63  CALCIUM 8.3*   CBG (last 3)  No results for input(s): GLUCAP in the last 72 hours.  Wt Readings from Last 3 Encounters:  05/26/17 58.9 kg (129 lb 12.8 oz)  05/21/17 66.7 kg (147 lb)  04/22/17 63.5 kg (140 lb)    Physical Exam:  BP (!) 181/63 (BP Location: Left Arm)   Pulse 74   Temp 98.8 F (37.1 C) (Oral)   Resp 20   Ht 5\' 5"  (1.651 m)   Wt 58.9 kg (129 lb 12.8 oz)   SpO2 97%   BMI 21.60 kg/m  Constitutional: She appears well-developed. NAD. HENT: Normocephalic. Atraumatic Eyes: EOM are normal. No discharge.  Cardiovascular: RRR. No JVD. Respiratory: Effort normal and breath sounds normal.  GI: Soft. Bowel sounds are normal.  Musculoskeletal:  Right AKA Neurological: She is alert and oriented.  Motor: B/L UE 5/5 proximal to distal LLE: 4+/5 proximal to distal RLE: HF 4+/5 HR Skin:  Incision clean/dry/intact. Graft sites c/d/i.  Induration noted at proximal right site, no erythema, warmth. Psychiatric: She has a normal mood and affect. Her behavior is normal.   Assessment/Plan: 1. Functional deficits secondary to right AKA which require 3+ hours per day of interdisciplinary therapy in a comprehensive inpatient rehab setting. Physiatrist is providing close team supervision and  24 hour management of active medical problems listed below. Physiatrist and rehab team continue to assess barriers to discharge/monitor patient progress toward functional and medical goals.  Function:  Bathing Bathing position   Position: Wheelchair/chair at sink  Bathing parts Body parts bathed by patient: Right arm, Left arm, Chest, Abdomen, Front perineal area, Buttocks, Right upper leg, Left upper leg, Left lower leg Body parts bathed by helper: Back  Bathing assist        Upper Body Dressing/Undressing Upper body dressing   What is the patient wearing?: Pull over shirt/dress, Bra Bra - Perfomed by patient: Thread/unthread right bra strap, Thread/unthread left bra strap, Hook/unhook bra (pull down sports bra)   Pull over shirt/dress - Perfomed by patient: Thread/unthread right sleeve, Thread/unthread left sleeve, Put head through opening, Pull shirt over trunk          Upper body assist Assist Level: Set up      Lower Body Dressing/Undressing Lower body dressing   What is the patient wearing?: Socks, Shoes, Pants, Underwear Underwear - Performed by patient: Thread/unthread right underwear leg, Thread/unthread left underwear leg, Pull underwear up/down   Pants- Performed by patient: Thread/unthread right pants leg, Thread/unthread left pants leg, Pull pants up/down   Non-skid slipper socks- Performed by patient: Don/doff left sock   Socks - Performed by patient: Don/doff left sock (right sock NA secondary to  amputation)   Shoes - Performed by patient: Don/doff left shoe, Fasten left (right shoe NA secondary to amputation)            Lower body assist Assist for lower body dressing: Touching or steadying assistance (Pt > 75%)      Toileting Toileting   Toileting steps completed by patient: Adjust clothing prior to toileting, Performs perineal hygiene, Adjust clothing after toileting   Toileting Assistive Devices: Grab bar or rail  Toileting assist Assist level:  Supervision or verbal cues   Transfers Chair/bed transfer   Chair/bed transfer method: Stand pivot Chair/bed transfer assist level: Touching or steadying assistance (Pt > 75%) Chair/bed transfer assistive device: Armrests, Medical sales representative     Max distance: 35 ft Assist level: Touching or steadying assistance (Pt > 75%)   Wheelchair   Type: Manual Max wheelchair distance: 70 ft Assist Level: Supervision or verbal cues  Cognition Comprehension Comprehension assist level: Follows complex conversation/direction with extra time/assistive device  Expression Expression assist level: Expresses complex ideas: With no assist  Social Interaction Social Interaction assist level: Interacts appropriately with others - No medications needed.  Problem Solving Problem solving assist level: Solves complex problems: Recognizes & self-corrects  Memory Memory assist level: Complete Independence: No helper    Medical Problem List and Plan:  1. Decreased functional mobility secondary to right AKA 05/21/2017 as well as PVD with multiple revascularization procedures   Cont CIR, very motivated 2. DVT Prophylaxis/Anticoagulation: Eliquis resumed postoperatively  3. Pain Management: Ultram as needed  4. Mood: Provide emotional support  5. Neuropsych: This patient is capable of making decisions on her own behalf.  6. Skin/Wound Care: Routine skin checks   Monitor graft site for signs/symptoms of infection. 7. Fluids/Electrolytes/Nutrition: Routine I&Os 8. Atrial fibrillation. Cardiac rate controlled. Continue Eliquis  9. Hypertension.   Cardizem CD 180 mg daily  Tambocor 50 mg twice a day  Lisinopril 20 mg daily, increased to BID on 6/7.   Monitor with increased mobility  10. CAD/NSTEMI. Continue aspirin. No chest pain or shortness of breath  11. ?Polycythemia vera: Follow labs 12. GERD. Protonix  13. Hypoalbuminemia  Supplement initiated 6/6 14. Leukocytosis  WBCs 18.8 on  6/6  Afebrile  Cont to monitor 15. ABLA  Hb 9.3 on 6/6  Cont to monitor   LOS (Days) 2 A FACE TO FACE EVALUATION WAS PERFORMED  [name] Lorie Phenix 05/27/2017 8:49 AM

## 2017-05-27 NOTE — Progress Notes (Signed)
Physical Therapy Session Note  Patient Details  Name: VERLE BRILLHART MRN: [id number] Date of Birth: 08/09/1951  Today's Date: 05/27/2017 PT Individual Time: 0800-0900 PT Individual Time Calculation (min): 60 min   Short Term Goals: Week 1:  PT Short Term Goal 1 (Week 1): STG=LTG due to ELOS  Skilled Therapeutic Interventions/Progress Updates:    Pt up in w/c upon arrival, agreeable to PT session. Bed mobility: supervision supine<>sitting, Transfers: supervision sit<>stand with rw, supervision squat-pivot w/c mat table. W/C- propelling 200 ft with supervision throughout unit. Trial of 16X16 with improved fit and comfort. Ambulation: 45', 15', 60'  with rw and supervision. Exercise: Rt LE; leg raise (min resist), sidelying hip abd (cues for correct position), pillow squeeze, supine hip extension into pillow - all 1X10. Following session, pt returned to room, up in w/c with all needs in reach.    Therapy Documentation Precautions:  Precautions Precautions: Fall Restrictions Weight Bearing Restrictions: Yes RLE Weight Bearing: Non weight bearing Pain: Pain Assessment Pain Assessment: 0-10 Pain Score: 6  Pain Intervention(s): Medication (See eMAR)  See Function Navigator for Current Functional Status.   Therapy/Group: Individual Therapy  Linard Millers, PT 05/27/2017, 8:32 AM

## 2017-05-27 NOTE — Progress Notes (Signed)
Physical Therapy Session Note  Patient Details  Name: [name] MRN: [id number] Date of Birth: 09-Jul-1951  Today's Date: 05/27/2017 PT Individual Time: 0905-1005 PT Individual Time Calculation (min): 60 min   Short Term Goals: Week 1:  PT Short Term Goal 1 (Week 1): STG=LTG due to ELOS  Skilled Therapeutic Interventions/Progress Updates:   Pt received sitting in WC and agreeable to PT  WC mobility over level and unlevel surface inclduign hall of hospital, 537ft, 267ft and 417ft,concrete side walk 362ft at hospital entrance, and up/down handicap ramp to parking garage.163ft x 2. PT provided supervision assist with cues for proper UE placement, improved control on downhill grade and improved anterior weight shift while going up hill to prevent posterior LOB  Nustep x 8 minutes for BUE and LLE strengthening/endurance. Min cues from PT for proper speed to prevent excessive fatigue and as well as positioning in seat to reduce pain in RLE.   Squat pivot transfer training x6 throughout treatment to various surfaces. Supervision assist from PT with min cues for UE positioning and set up.  Squat pivot transfes included wc<>mat table, WC<>nustep, and WC<>WC to sit in WC with anti-tipper bars to improve safety with community mobility.      Therapy Documentation Precautions:  Precautions Precautions: Fall Restrictions Weight Bearing Restrictions: Yes RLE Weight Bearing: Non weight bearing Vital Signs: Therapy Vitals Temp: 98.8 F (37.1 C) Temp Source: Oral Pulse Rate: 66 Resp: 20 BP: (!) 174/56 Patient Position (if appropriate): Lying Oxygen Therapy SpO2: 98 % O2 Device: Not Delivered Pain: Pain Assessment Pain Assessment: 0-10 Pain Score: 2  Faces Pain Scale: Hurts little more Pain Type: Surgical pain Pain Location: Leg Pain Orientation: Right Pain Descriptors / Indicators: Discomfort Pain Frequency: Intermittent Pain Onset: With Activity Pain Intervention(s):  Repositioned Multiple Pain Sites: No   See Function Navigator for Current Functional Status.   Therapy/Group: Individual Therapy  Lorie Phenix 05/27/2017, 4:14 PM

## 2017-05-27 NOTE — Discharge Instructions (Signed)
Inpatient Rehab Discharge Instructions  [name] Discharge date and time: No discharge date for patient encounter.   Activities/Precautions/ Functional Status: Activity: activity as tolerated Diet: regular diet Wound Care: keep wound clean and dry Functional status:  ___ No restrictions     ___ Walk up steps independently ___ 24/7 supervision/assistance   ___ Walk up steps with assistance ___ Intermittent supervision/assistance  ___ Bathe/dress independently ___ Walk with walker     _x__ Bathe/dress with assistance ___ Walk Independently    ___ Shower independently ___ Walk with assistance    ___ Shower with assistance ___ No alcohol     ___ Return to work/school ________  Special Instructions:    COMMUNITY REFERRALS UPON DISCHARGE:    Home Health:   PT, OT, RN     [place]   Date of last service:06/02/2017   Medical Equipment/Items [place]   [phone]   GENERAL COMMUNITY RESOURCES FOR PATIENT/FAMILY: Support Groups:[facility] SUPPORT GROUP FIRST Tuesday OF EVERY MONTH @ 7:00 PM @ [place] [phone]   My questions have been answered and I understand these instructions. I will adhere to these goals and the provided educational materials after my discharge from the hospital.  Patient/Caregiver Signature _______________________________ Date __________  Clinician Signature _______________________________________ Date __________  Please bring this form and your medication list with you to all your follow-up doctor's appointments.

## 2017-05-28 ENCOUNTER — Inpatient Hospital Stay (HOSPITAL_COMMUNITY): Payer: Medicare Other | Admitting: Physical Therapy

## 2017-05-28 ENCOUNTER — Telehealth: Payer: Self-pay | Admitting: Vascular Surgery

## 2017-05-28 ENCOUNTER — Inpatient Hospital Stay (HOSPITAL_COMMUNITY): Payer: Medicare Other

## 2017-05-28 DIAGNOSIS — I169 Hypertensive crisis, unspecified: Secondary | ICD-10-CM

## 2017-05-28 MED ORDER — CYCLOBENZAPRINE HCL 5 MG PO TABS
5.0000 mg | ORAL_TABLET | Freq: Three times a day (TID) | ORAL | Status: DC | PRN
  Administered 2017-05-28: 5 mg via ORAL
  Filled 2017-05-28: qty 1

## 2017-05-28 MED ORDER — HYDRALAZINE HCL 10 MG PO TABS
10.0000 mg | ORAL_TABLET | Freq: Three times a day (TID) | ORAL | Status: DC | PRN
  Administered 2017-05-28 (×2): 10 mg via ORAL
  Filled 2017-05-28 (×2): qty 1

## 2017-05-28 MED ORDER — DICLOFENAC SODIUM 1 % TD GEL
2.0000 g | Freq: Four times a day (QID) | TRANSDERMAL | Status: DC
  Administered 2017-05-28 – 2017-05-31 (×8): 2 g via TOPICAL
  Filled 2017-05-28: qty 100

## 2017-05-28 NOTE — Telephone Encounter (Signed)
Sched appt 06/15/17 at 8:30. Spoke to pt to confirm appt.

## 2017-05-28 NOTE — Progress Notes (Addendum)
  [facility] Progress Note  Subjective   Patient seen in rehab. Doing well. No complaints. Transferred from wheelchair to bed with walker without issues.   Objective Vitals:   05/27/17 1451 05/28/17 0400  BP: (!) 174/56 (!) 192/60  Pulse: 66 74  Resp: 20 20  Temp: 98.8 F (37.1 C) 98.1 F (36.7 C)    Intake/Output Summary (Last 24 hours) at 05/28/17 0804 Last data filed at 05/27/17 1800  Gross per 24 hour  Intake              480 ml  Output                0 ml  Net              480 ml   Left thigh saphenectomy site and right groin incisions clean and intact.  Right AKA staples intact. Some dried blood at incision.   Assessment/Planning: 66 y.o. female is s/p: right AKA  Incisions without signs of infection. Right AKA healing well.  Patient progressing well in rehab. Planned d/c Aydin Cavalieri next week.  F/u 4 weeks from AKA surgery date for staple removal.   Janalyn Harder Trinh 05/28/2017 8:04 AM --  Laboratory CBC    Component Value Date/Time   WBC 18.8 (H) 05/26/2017 0619   HGB 9.3 (L) 05/26/2017 0619   HGB 8.8 (L) 03/09/2017 1200   HCT 32.2 (L) 05/26/2017 0619   HCT 30.6 (L) 03/09/2017 1200   PLT 456 (H) 05/26/2017 0619   PLT 429 (H) 03/09/2017 1200    BMET    Component Value Date/Time   NA 135 05/26/2017 0619   K 3.5 05/26/2017 0619   CL 99 (L) 05/26/2017 0619   CO2 25 05/26/2017 0619   GLUCOSE 111 (H) 05/26/2017 0619   BUN 5 (L) 05/26/2017 0619   CREATININE 0.63 05/26/2017 0619   CREATININE 1.33 (H) 03/03/2016 1531   CALCIUM 8.3 (L) 05/26/2017 0619   GFRNONAA >60 05/26/2017 0619   GFRAA >60 05/26/2017 0619    COAG Lab Results  Component Value Date   INR 1.19 05/17/2017   INR 1.07 01/09/2017   INR 1.09 01/31/2016   No results found for: PTT  Antibiotics Anti-infectives    None       Virgina Jock, PA-C [facility] Office: [phone] Pager: [phone] 05/28/2017 8:04 AM   I have examined the  patient, reviewed and agree with above.Wounds all look good. Doing great in rehabilitation. Will get staples out at approximately 3 weeks post  Curt Jews, MD 05/28/2017 10:13 AM

## 2017-05-28 NOTE — Progress Notes (Signed)
Occupational Therapy Session Note  Patient Details  Name: [name] MRN: [id number] Date of Birth: 02/13/1951  Today's Date: 05/28/2017 OT Individual Time: 0700-0755 OT Individual Time Calculation (min): 55 min    Short Term Goals: Week 1:  OT Short Term Goal 1 (Week 1): STGs equal to LTGs set at modified independent level overall.  Skilled Therapeutic Interventions/Progress Updates:    1:1. Pain in residual limb, however RN already administered medication. Pt completes all transfers and mobility with supervision with min VC for safety awareness. Pt completes toileting with supervision using RW for balance while completing clothing management. Pt bathes at sit to stand level with supervision to wash 9/9 body parts with VC to stabilize hips on sink. Pt gathers clothes at w/c level and dons all clothes with supervision. Ptcompletes oral care and hair care in standing with using BUE to wash hair in shower cap with min guard assist for balance. Exited session with pt seated in w/c with call light in reach, MD entering room and all needs met.  Therapy Documentation Precautions:  Precautions Precautions: Fall Restrictions Weight Bearing Restrictions: Yes RLE Weight Bearing: Non weight bearing    Other Treatments:    See Function Navigator for Current Functional Status.   Therapy/Group: Individual Therapy  Tonny Branch 05/28/2017, 7:55 AM

## 2017-05-28 NOTE — Progress Notes (Signed)
Physical Therapy Session Note  Patient Details  Name: [name] MRN: [id number] Date of Birth: Nov 25, 1951  Today's Date: 05/28/2017 PT Individual Time: 1105-1203, 1572-6203 PT Individual Time Calculation (min): 58 min, 28 min  Short Term Goals: Week 1:  PT Short Term Goal 1 (Week 1): STG=LTG due to ELOS  Skilled Therapeutic Interventions/Progress Updates:    Session 1: Pt in w/c upon arrival, agreeable to PT session. Pt propelling w/c variable distances throughout session at Mod I level. Ambulation performed with rw and supervision 35 ft as well as multiple shorter distances in halls as well as apartment. Transfers: supervision from car, apartment bed, couch, recliner, w/c, mat table. Reminder provided once to fully turn prior to sitting. Mat exercises performed with on Lt side with Rt hip abduction 1x10, extension 1x10 with cues for technique. Following session, pt returned to room, up in w/c with all needs in reach.  Session 2: Pt in w/c upon arrival, agreeable to PT session. Transfers: sit<>stand supervision, ambulation: 130 ft with supervision, 5 standing breaks. Steps: up/down 2 (3 inch) steps with min assist. Pt returned to room after session with all needs in reach. [place]an added to rw handles for additional padding.   Therapy Documentation Precautions:  Precautions Precautions: Fall Restrictions Weight Bearing Restrictions: Yes RLE Weight Bearing: Non weight bearing   Pain: Variable, occasional muscle spasms and phantom pain. Monitored during session.   See Function Navigator for Current Functional Status.   Therapy/Group: Individual Therapy  Linard Millers, PT 05/28/2017, 12:13 PM

## 2017-05-28 NOTE — Progress Notes (Signed)
Social Work Patient ID: [name], female   DOB: [birth year], 66 y.o.   MRN: [id number] team feels pt can discharge earlier on Monday instead of Tuesday. Md agreeable and pt very pleased with. Plan discharge Monday.

## 2017-05-28 NOTE — Progress Notes (Signed)
Physical Therapy Session Note  Patient Details  Name: [name] MRN: [id number] Date of Birth: [birth year]  Today's Date: 05/28/2017 PT Individual Time: 1605-1700 PT Individual Time Calculation (min): 55 min   Short Term Goals: Week 1:  PT Short Term Goal 1 (Week 1): STG=LTG due to ELOS  Skilled Therapeutic Interventions/Progress Updates:     Pt received supine in bed and agreeable to PT. Supine>sit transfer without assist or cues . Squat pivot transfer to WC. WC mobility 2 x 567f to and from entrance of hospital. Simulated community toilet transfer to toilet in handicap stall of bathroom with distant supervision assist for safety as well as cues for  Proper WC positioning and safety.   WC mobility over unlevel cement sidewalk at hospital entrance. Distant supervision assist from PT on unlevel surface with min cues for improved control while going up/down hill.   Gait training on unlevel surface x 631fwith RW and min assist from PT. Min cues for AD management on unlevel surface.   PT transported pt to rehab gym . BUE 5 minutes, level 4 forward. 3 minutes therapeutic rest break and 5 minutes backwards. Cues for proper speed to reduce fatigue.  [facility] and all needs met.      Therapy Documentation Precautions:  Precautions Precautions: Fall Restrictions Weight Bearing Restrictions: Yes RLE Weight Bearing: Non weight bearing   Pain: Pain Assessment Pain Assessment: Faces Faces Pain Scale: Hurts little more Pain Type: Phantom pain Pain Location: Leg Pain Orientation: Right Pain Descriptors / Indicators: Aching Pain Frequency: Intermittent Pain Intervention(s): RN made aware   See Function Navigator for Current Functional Status.   Therapy/Group: Individual Therapy  AuLorie Phenix/07/2017, 10:42 PM

## 2017-05-28 NOTE — Progress Notes (Signed)
B/P elevated prior to start of shift. See VS in flow sheet.  Rechecked b/p, administered PRN hydralazine. Checked b/p again and still elevated. Contact PA, will administer PRN flexeril to see if pain affecting b/p.  Will not administer another dose at this time.  Rechecked b/p and still elevated. Administered 2nd dose of PRN hydralazine.

## 2017-05-28 NOTE — Progress Notes (Signed)
Social Work  Discharge Note  The overall goal for the admission was met for:   Discharge location: Yes-HOME WITH SISTER'S AND FRIENDS CHECKING IN ON HER.  Length of Stay: Yes-6 DAYS  Discharge activity level: Yes-MOD/I LEVEL  Home/community participation: Yes  Services provided included: MD, RD, PT, OT, RN, CM, Pharmacy and SW  Financial Services: Medicaid and Private Insurance: [facility]  Follow-up services arranged: Home Health: [facility]-PT,OT,RN,AIDE, DME: [facility] and Patient/Family has no preference for HH/DME agencies  Comments (or additional information):PT DID VERY WELL AND REACHED MOD/I  LEVEL GOALS, READY TO [place]. SISTER'S AND FRIENDS TO CHECK ON HER.  Patient/Family verbalized understanding of follow-up arrangements: Yes  Individual responsible for coordination of the follow-up plan: SELF & MARTHA-SISTER  Confirmed correct DME delivered: [name] 05/28/2017    [name]

## 2017-05-28 NOTE — Telephone Encounter (Signed)
-----   Message from Mena Goes, RN sent at 05/28/2017  8:50 AM EDT ----- Regarding: 3 weeks    ----- Message ----- From: Alvia Grove, PA-C Sent: 05/28/2017   8:41 AM To: Vvs Charge Pool  S/p right AKA 05/21/17  Needs f/u with Dr. Donnetta Hutching 3 weeks from now.  Thanks Maudie Mercury

## 2017-05-28 NOTE — Progress Notes (Signed)
[place] PHYSICAL MEDICINE & REHABILITATION     PROGRESS NOTE  Subjective/Complaints:  Pt seen laying in bed this AM.  Vasc Surg at bedside as well.  Pt slept well overnight.  She notes left knee soreness.  ROS: Denies CP, SOB, N/V/D.  Objective: Vital Signs: Blood pressure (!) 192/60, pulse 74, temperature 98.1 F (36.7 C), temperature source Oral, resp. rate 20, height 5\' 5"  (1.651 m), weight 58.9 kg (129 lb 12.8 oz), SpO2 98 %. No results found.  Recent Labs  05/26/17 0619  WBC 18.8*  HGB 9.3*  HCT 32.2*  PLT 456*    Recent Labs  05/26/17 0619  NA 135  K 3.5  CL 99*  GLUCOSE 111*  BUN 5*  CREATININE 0.63  CALCIUM 8.3*   CBG (last 3)  No results for input(s): GLUCAP in the last 72 hours.  Wt Readings from Last 3 Encounters:  05/26/17 58.9 kg (129 lb 12.8 oz)  05/21/17 66.7 kg (147 lb)  04/22/17 63.5 kg (140 lb)    Physical Exam:  BP (!) 192/60 (BP Location: Right Arm)   Pulse 74   Temp 98.1 F (36.7 C) (Oral)   Resp 20   Ht 5\' 5"  (1.651 m)   Wt 58.9 kg (129 lb 12.8 oz)   SpO2 98%   BMI 21.60 kg/m  Constitutional: She appears well-developed. NAD. HENT: Normocephalic. Atraumatic Eyes: EOM are normal. No discharge.  Cardiovascular: RRR. No JVD. Respiratory: Effort normal and breath sounds normal.  GI: Soft. Bowel sounds are normal.  Musculoskeletal:  Right AKA Neurological: She is alert and oriented.  Motor: B/L UE 5/5 proximal to distal LLE: 4+/5 proximal to distal RLE: HF 4+/5 HF Skin:  Incision clean/dry/intact. Graft sites c/d/i.  Induration noted at proximal right site, no erythema, warmth. Psychiatric: She has a normal mood and affect. Her behavior is normal.   Assessment/Plan: 1. Functional deficits secondary to right AKA which require 3+ hours per day of interdisciplinary therapy in a comprehensive inpatient rehab setting. Physiatrist is providing close team supervision and 24 hour management of active medical problems listed  below. Physiatrist and rehab team continue to assess barriers to discharge/monitor patient progress toward functional and medical goals.  Function:  Bathing Bathing position   Position: Wheelchair/chair at sink  Bathing parts Body parts bathed by patient: Right arm, Left arm, Chest, Abdomen, Front perineal area, Buttocks, Right upper leg, Left upper leg, Left lower leg Body parts bathed by helper: Back  Bathing assist        Upper Body Dressing/Undressing Upper body dressing   What is the patient wearing?: Pull over shirt/dress, Bra Bra - Perfomed by patient: Thread/unthread right bra strap, Thread/unthread left bra strap, Hook/unhook bra (pull down sports bra)   Pull over shirt/dress - Perfomed by patient: Thread/unthread right sleeve, Thread/unthread left sleeve, Put head through opening, Pull shirt over trunk          Upper body assist Assist Level: Set up      Lower Body Dressing/Undressing Lower body dressing   What is the patient wearing?: Socks, Shoes, Pants, Underwear Underwear - Performed by patient: Thread/unthread right underwear leg, Thread/unthread left underwear leg, Pull underwear up/down   Pants- Performed by patient: Thread/unthread right pants leg, Thread/unthread left pants leg, Pull pants up/down   Non-skid slipper socks- Performed by patient: Don/doff left sock   Socks - Performed by patient: Don/doff left sock (right sock NA secondary to amputation)   Shoes - Performed by patient: Don/doff left  shoe, Fasten left (right shoe NA secondary to amputation)            Lower body assist Assist for lower body dressing: Touching or steadying assistance (Pt > 75%)      Toileting Toileting   Toileting steps completed by patient: Adjust clothing prior to toileting, Performs perineal hygiene, Adjust clothing after toileting   Toileting Assistive Devices: Grab bar or rail  Toileting assist Assist level: Supervision or verbal cues   Transfers Chair/bed  transfer   Chair/bed transfer method: Ambulatory Chair/bed transfer assist level: Supervision or verbal cues Chair/bed transfer assistive device: Armrests, Medical sales representative     Max distance: 45 ft Assist level: Supervision or verbal cues   Wheelchair   Type: Manual Max wheelchair distance: 200 ft Assist Level: No help, No cues, assistive device, takes more than reasonable amount of time  Cognition Comprehension Comprehension assist level: Follows complex conversation/direction with extra time/assistive device  Expression Expression assist level: Expresses complex ideas: With no assist  Social Interaction Social Interaction assist level: Interacts appropriately with others - No medications needed.  Problem Solving Problem solving assist level: Solves complex problems: Recognizes & self-corrects  Memory Memory assist level: Complete Independence: No helper    Medical Problem List and Plan:  1. Decreased functional mobility secondary to right AKA 05/21/2017 as well as PVD with multiple revascularization procedures   Cont CIR, very motivated 2. DVT Prophylaxis/Anticoagulation: Eliquis resumed postoperatively  3. Pain Management: Ultram as needed  4. Mood: Provide emotional support  5. Neuropsych: This patient is capable of making decisions on her own behalf.  6. Skin/Wound Care: Routine skin checks   Monitor graft site for signs/symptoms of infection. 7. Fluids/Electrolytes/Nutrition: Routine I&Os 8. Atrial fibrillation. Cardiac rate controlled. Continue Eliquis  9. Hypertension.   Cardizem CD 180 mg daily  Tambocor 50 mg twice a day  Lisinopril 20 mg daily, increased to BID on 6/7.   Monitor with increased mobility   Hypertensive crisis this AM, will add PRN meds 10. CAD/NSTEMI. Continue aspirin. No chest pain or shortness of breath  11. ?Polycythemia vera: Follow labs 12. GERD. Protonix  13. Hypoalbuminemia  Supplement initiated 6/6 14. Leukocytosis  WBCs  18.8 on 6/6  Afebrile  Cont to monitor  Labs ordered for Monday 15. ABLA  Hb 9.3 on 6/6  Cont to monitor  Labs ordered for Monday  LOS (Days) 3 A FACE TO FACE EVALUATION WAS PERFORMED  Devin Foskey Lorie Phenix 05/28/2017 8:36 AM

## 2017-05-29 DIAGNOSIS — G546 Phantom limb syndrome with pain: Secondary | ICD-10-CM

## 2017-05-29 MED ORDER — HYDRALAZINE HCL 10 MG PO TABS
10.0000 mg | ORAL_TABLET | Freq: Three times a day (TID) | ORAL | Status: DC
  Administered 2017-05-29 – 2017-05-30 (×4): 10 mg via ORAL
  Filled 2017-05-29 (×4): qty 1

## 2017-05-29 MED ORDER — GABAPENTIN 100 MG PO CAPS
100.0000 mg | ORAL_CAPSULE | Freq: Three times a day (TID) | ORAL | Status: DC
  Administered 2017-05-29 – 2017-05-31 (×7): 100 mg via ORAL
  Filled 2017-05-29 (×7): qty 1

## 2017-05-29 NOTE — Progress Notes (Signed)
[facility] PHYSICAL MEDICINE & REHABILITATION     PROGRESS NOTE  Subjective/Complaints:  Pt seen laying in bed this AM.  She slept well overnight.  Later reported by nursing, pt with complains of phantom limb pain.   ROS: +Phantom limb pain. Denies CP, SOB, N/V/D.  Objective: Vital Signs: Blood pressure (!) 172/45, pulse 76, temperature 98.1 F (36.7 C), temperature source Oral, resp. rate 18, height 5\' 5"  (1.651 m), weight 58.9 kg (129 lb 12.8 oz), SpO2 99 %. No results found. No results for input(s): WBC, HGB, HCT, PLT in the last 72 hours. No results for input(s): NA, K, CL, GLUCOSE, BUN, CREATININE, CALCIUM in the last 72 hours.  Invalid input(s): CO CBG (last 3)  No results for input(s): GLUCAP in the last 72 hours.  Wt Readings from Last 3 Encounters:  05/26/17 58.9 kg (129 lb 12.8 oz)  05/21/17 66.7 kg (147 lb)  04/22/17 63.5 kg (140 lb)    Physical Exam:  BP (!) 172/45 (BP Location: Right Arm)   Pulse 76   Temp 98.1 F (36.7 C) (Oral)   Resp 18   Ht 5\' 5"  (1.651 m)   Wt 58.9 kg (129 lb 12.8 oz)   SpO2 99%   BMI 21.60 kg/m  Constitutional: She appears well-developed. NAD. HENT: Normocephalic. Atraumatic Eyes: EOM are normal. No discharge.  Cardiovascular: RRR. No JVD. Respiratory: Effort normal and breath sounds normal.  GI: Soft. Bowel sounds are normal.  Musculoskeletal:  Right AKA Neurological: She is alert and oriented.  Motor: B/L UE 5/5 proximal to distal LLE: 4+/5 proximal to distal RLE: HF 4+/5 HF (stable) Skin:  Incision clean/dry/intact. Graft sites c/d/i.  Induration noted at proximal right site, no erythema, warmth. Psychiatric: She has a normal mood and affect. Her behavior is normal.   Assessment/Plan: 1. Functional deficits secondary to right AKA which require 3+ hours per day of interdisciplinary therapy in a comprehensive inpatient rehab setting. Physiatrist is providing close team supervision and 24 hour management of active medical  problems listed below. Physiatrist and rehab team continue to assess barriers to discharge/monitor patient progress toward functional and medical goals.  Function:  Bathing Bathing position   Position: Wheelchair/chair at sink  Bathing parts Body parts bathed by patient: Right arm, Left arm, Chest, Abdomen, Front perineal area, Buttocks, Right upper leg, Left upper leg, Left lower leg Body parts bathed by helper: Back  Bathing assist        Upper Body Dressing/Undressing Upper body dressing   What is the patient wearing?: Pull over shirt/dress, Bra Bra - Perfomed by patient: Thread/unthread right bra strap, Thread/unthread left bra strap, Hook/unhook bra (pull down sports bra)   Pull over shirt/dress - Perfomed by patient: Thread/unthread right sleeve, Thread/unthread left sleeve, Put head through opening, Pull shirt over trunk          Upper body assist Assist Level: Set up      Lower Body Dressing/Undressing Lower body dressing   What is the patient wearing?: Socks, Shoes, Pants, Underwear Underwear - Performed by patient: Thread/unthread right underwear leg, Thread/unthread left underwear leg, Pull underwear up/down   Pants- Performed by patient: Thread/unthread right pants leg, Thread/unthread left pants leg, Pull pants up/down   Non-skid slipper socks- Performed by patient: Don/doff left sock   Socks - Performed by patient: Don/doff left sock (right sock NA secondary to amputation)   Shoes - Performed by patient: Don/doff left shoe, Fasten left  Lower body assist Assist for lower body dressing: Supervision or verbal cues      Toileting Toileting   Toileting steps completed by patient: Adjust clothing prior to toileting, Performs perineal hygiene, Adjust clothing after toileting   Toileting Assistive Devices: Grab bar or rail  Toileting assist Assist level: Supervision or verbal cues   Transfers Chair/bed transfer   Chair/bed transfer method: Squat  pivot Chair/bed transfer assist level: Supervision or verbal cues Chair/bed transfer assistive device: Armrests, Medical sales representative     Max distance: 130 ft Assist level: Supervision or verbal cues   Wheelchair   Type: Manual Max wheelchair distance: 200 ft Assist Level: No help, No cues, assistive device, takes more than reasonable amount of time  Cognition Comprehension Comprehension assist level: Follows complex conversation/direction with extra time/assistive device  Expression Expression assist level: Expresses complex ideas: With no assist  Social Interaction Social Interaction assist level: Interacts appropriately with others - No medications needed.  Problem Solving Problem solving assist level: Solves complex problems: Recognizes & self-corrects  Memory Memory assist level: Complete Independence: No helper    Medical Problem List and Plan:  1. Decreased functional mobility secondary to right AKA 05/21/2017 as well as PVD with multiple revascularization procedures   Cont CIR 2. DVT Prophylaxis/Anticoagulation: Eliquis resumed postoperatively  3. Pain Management: Ultram as needed   Gabapentin 100 TID started 6/9 4. Mood: Provide emotional support  5. Neuropsych: This patient is capable of making decisions on her own behalf.  6. Skin/Wound Care: Routine skin checks   Monitor graft site for signs/symptoms of infection. 7. Fluids/Electrolytes/Nutrition: Routine I&Os 8. Atrial fibrillation. Cardiac rate controlled. Continue Eliquis  9. Hypertension.   Cardizem CD 180 mg daily  Tambocor 50 mg twice a day  Lisinopril 20 mg daily, increased to BID on 6/7.   Hydralazine 10 TID added 6/9  Monitor with increased mobility   Added PRN meds 10. CAD/NSTEMI. Continue aspirin. No chest pain or shortness of breath  11. ?Polycythemia vera: Follow labs 12. GERD. Protonix  13. Hypoalbuminemia  Supplement initiated 6/6 14. Leukocytosis  WBCs 18.8 on  6/6  Afebrile  Cont to monitor  Labs ordered for Monday 15. ABLA  Hb 9.3 on 6/6  Cont to monitor  Labs ordered for Monday  LOS (Days) 4 A FACE TO FACE EVALUATION WAS PERFORMED  Jonaya Freshour Lorie Phenix 05/29/2017 8:40 AM

## 2017-05-30 ENCOUNTER — Inpatient Hospital Stay (HOSPITAL_COMMUNITY): Payer: Medicare Other | Admitting: Physical Therapy

## 2017-05-30 ENCOUNTER — Inpatient Hospital Stay (HOSPITAL_COMMUNITY): Payer: Medicare Other | Admitting: Occupational Therapy

## 2017-05-30 MED ORDER — HYDRALAZINE HCL 25 MG PO TABS
25.0000 mg | ORAL_TABLET | Freq: Three times a day (TID) | ORAL | Status: DC
  Administered 2017-05-30 – 2017-05-31 (×3): 25 mg via ORAL
  Filled 2017-05-30 (×3): qty 1

## 2017-05-30 NOTE — Discharge Summary (Signed)
Discharge summary job # [id number]

## 2017-05-30 NOTE — Discharge Summary (Signed)
NAMEMURL, ZOGG NO.:  [id number]  MEDICAL RECORD NO.:  [id number]  LOCATION:  [id number]                        FACILITY:  [facility]  PHYSICIAN:  Delice Lesch, MD        DATE OF BIRTH:  [birth year]  DATE OF ADMISSION:  05/25/2017 DATE OF DISCHARGE:  05/31/2017                              DISCHARGE SUMMARY   DISCHARGE DIAGNOSES: 1. Right above-knee amputation, May 21, 2017. 2. Eliquis for history of atrial fibrillation. 3. Pain management. 4. Atrial fibrillation. 5. Hypertension. 6. Coronary artery disease. 7. Question polycythemia vera. 8. Gastroesophageal reflux disease. 9. Acute blood loss anemia.  HISTORY OF PRESENT ILLNESS:  This is a 66 year old right-handed female, history of polycythemia vera, diagnosed July 2016, followed by Dr. Beryle Beams; remote tobacco abuse; CAD; hypertension; atrial fibrillation, maintained on Eliquis; as well as peripheral vascular disease, multiple revascularization procedures.  Lives with 51 year old granddaughter, independent prior to admission.  Presented May 20, 2017, with right lower extremity ischemic changes and rest pain that progressed over the past week.  Underwent right femoral to tibioperoneal trunk bypass May 17, 2017, per Dr. Donnetta Hutching.  Postoperative pain management.  Ongoing progressive ischemic changes.  Limb was not felt to be salvageable.  Underwent right AKA May 21, 2017, per Dr. Donnetta Hutching. Eliquis resumed postoperatively.  Acute blood loss anemia 8.3 and monitored.  Mildly elevated troponin 0.2.  Followup conservatively per Cardiology Services.  Physical and Occupational Therapy ongoing.  The patient was admitted for comprehensive rehab program.  PAST MEDICAL HISTORY:  See discharge diagnoses.  SOCIAL HISTORY:  Lives with 22 year old granddaughter, used a walker prior to admission.  FUNCTIONAL STATUS UPON ADMISSION TO REHAB SERVICES:  Minimal assist, stand pivot transfers; minimal assist, 35-feet rolling  walker; min mod assist, activities of daily living.  PHYSICAL EXAMINATION:  VITAL SIGNS:  Blood pressure 176/62, pulse 67, temperature 98, and respirations 18. GENERAL:  This was an alert female, in no acute distress, oriented x3. EOMs intact. NECK:  Supple, nontender.  No JVD. CARDIAC:  Rate controlled. ABDOMEN:  Soft, nontender.  Good bowel sounds. LUNGS:  Clear to auscultation without wheeze.  REHABILITATION HOSPITAL COURSE:  The patient was admitted to inpatient rehab services with therapies initiated on a 3-hour daily basis, consisting of physical therapy, occupational therapy, and rehabilitation nursing.  The following issues were addressed during the patient's rehabilitation stay.  Pertaining to Ms. Ron's right AKA May 21, 2017, neurovascular sensation intact.  She would follow up with Dr. Donnetta Hutching. Her Eliquis was resumed postoperatively for history of atrial fibrillation.  Cardiac rate controlled.  Pain management with the use of Ultram scheduled, Neurontin 100 mg t.i.d. with good results.  Blood pressures controlled with multiple antihypertensive medications. Cardizem, Tambocor, hydralazine, and lisinopril.  She would follow up with her primary MD.  Acute blood loss anemia, 9.6, monitored, she remained asymptomatic.  No chest pain or shortness of breath.  Noted documented history of polycythemia vera, diagnosed 2016.  She would follow up with [facility]ology [facility].  The patient received weekly collaborative interdisciplinary team conferences to discuss estimated length of stay, family teaching, any barriers to discharge.  Supine to sit transfers without assistance or cues; squat pivot transfers  to wheelchair, wheelchair mobility modified independent; simulated community toilet transfers to toilet and a handicap stall with distance supervision.  Ambulating 60-feet rolling walker.  She could gather her belongings for activities of daily living and homemaking.  Full  family teaching was completed and plan discharge to home.  DISCHARGE MEDICATIONS: 1. Eliquis 5 mg p.o. b.i.d. 2. Aspirin 325 mg p.o. daily. 3. Voltaren gel 2 g four times daily. 4. Cardizem CD 180 mg daily. 5. Colace 200 mg daily, hold for loose stools. 6. Zetia 10 mg at bedtime. 7. Tambocor 50 mg p.o. b.i.d. 8. Neurontin 100 mg p.o. t.i.d. 9. Hydralazine 25 mg every 8 hours. 10.Lisinopril 20 mg p.o. b.i.d. 11.Protonix 40 mg p.o. daily. 12.Ultram 50 mg p.o. every 4 hours, adjust accordingly, taper as     recommended.  DIET:  Her diet was regular.  FOLLOWUP:  The patient would follow up with Dr. Donnetta Hutching as advised; Dr. Dione Housekeeper, medical management; Dr. Delice Lesch at the outpatient rehab service office as directed.     Lauraine Rinne, P.A.   ______________________________ Delice Lesch, MD    DA/MEDQ  D:  05/30/2017  T:  05/30/2017  Job:  [id number]  cc:   Rosetta Posner, M.D. Margarita Rana, M.D. Delice Lesch, MD

## 2017-05-30 NOTE — Progress Notes (Signed)
Occupational Therapy Discharge Summary  Patient Details  Name: [name] MRN: [id number] Date of Birth: [birth year]  Today's Date: 05/30/2017 OT Individual Time: 8101-7510 and 2585-2778 OT Individual Time Calculation (min): 48 min and 75 min  Patient has met 11 of 11 long term goals due to improved activity tolerance, improved balance and ability to compensate for deficits.  Patient to discharge at overall Modified Independent level.  Patient is discharged at Mod I level and does not require caregiver to provide  assistance at discharge.    All goals met.   Recommendation:  Patient will benefit from ongoing skilled OT services in home health setting to continue to advance functional skills in the area of BADL.  Equipment: tub bench, BSC  Reasons for discharge: treatment goals met  Patient/family agrees with progress made and goals achieved: Yes   Skilled Therapeutic Intervention:  Tx focus on IADL retraining and d/c planning.   Pt greeted in w/c, ready to go.  Pt self propelled to therapy apartment, engaged in 2 simulated meal prep activities per home routine. Active pt-OT collaboration completed in regards to individualized home modifications to make to maximize pt safety and independence. Also went over use of LH clean supplies (duspan and duster). Pt reports she has these items at home. Pt able to complete housekeeping tasks in kitchen and in apartment at Mod I level. OT providing suggestions for home modifications that may be helpful with w/c negotiation and IADL mgt. Pt propelled to laundry room. Discussed her laundry room setup at home. Pt verbalizing aloud her plan for doing this, and demonstrating method for standing and accessing washer, loading/unloading linens, and putting them up to dry with hangers in room. She then self propelled back to room. Pt simulating B/D session that she completed this morning (without therapist) at Mod I level. Pt exhibiting good w/c safety during  sit<stand transition for simulated pericare/LB dressing. She verbalized that she elevates left foot onto bed for footwear and that she will use a bench in her room for this at home. Pt left in w/c with all needs within reach.   2nd Session 1:1 tx (75 min) Tx focus on UB strengthening, leisure participation, and d/c planning.   Pt greeted in w/c after lunch. She self propelled to tub shower room and completed a simulated tub shower transfer with RW at Mod I level, exhibiting good w/c safety/positioning. Afterwards pt self propelled to outdoor setting. She self propelled w/c over uneven surfaces, up/down inclines, and around environment barriers (including hospital visitors). Pt watering plants w/c level (per hobby at home, discussed adaptive ways for her to continue with this meaningful occupation w/c level). Pt standing to pull weeds at supportive brick wall. Pt engaging in w/c races around front entrance with OT, as well as ascending/descending steep ramp (in prep for bus stop access at home, she reports this is located at top of an inclined hill). Pt standing at brick wall once she reached the top of the ramp. Pt admiring the scenery, reminiscing about her plants at home. She stood for 5 minutes engaging in conversation with OT. Afterwards pt self propelled back to unit at Mod I level. She signed herself/therapist back onto unit. Pt left in w/c in her room, Mod I sign placed on her door. Pt educated on goal achievement and overall progress with verbalized understanding. Solidified DME/F/U therapy needs. Pt left with all needs within reach.   OT Discharge Precautions/Restrictions  Precautions Precautions: Fall Restrictions Weight Bearing Restrictions: Yes  RLE Weight Bearing: Non weight bearing   Vital Signs Therapy Vitals BP: (!) 159/49 Pain   ADL ADL ADL Comments: Please refer to functional navigator for ADL status   Cognition Overall Cognitive Status: Within Functional Limits for tasks  assessed Arousal/Alertness: Awake/alert Orientation Level: Oriented X4 Sensation Sensation Light Touch: Appears Intact Proprioception: Appears Intact Coordination Gross Motor Movements are Fluid and Coordinated: Yes Fine Motor Movements are Fluid and Coordinated: Yes Motor  Motor Motor: Within Functional Limits    Balance Balance Balance Assessed: Yes Dynamic Sitting Balance Dynamic Sitting - Balance Support: During functional activity Dynamic Sitting - Level of Assistance: 6: Modified independent (Device/Increase time) Dynamic Standing Balance Dynamic Standing - Balance Support: During functional activity Dynamic Standing - Level of Assistance: 6: Modified independent (Device/Increase time) Extremity/Trunk Assessment RUE Assessment RUE Assessment: Within Functional Limits LUE Assessment LUE Assessment: Within Functional Limits   See Function Navigator for Current Functional Status.  Chi Woodham A Itza Maniaci 05/30/2017, 9:24 PM

## 2017-05-30 NOTE — Progress Notes (Signed)
[facility] PHYSICAL MEDICINE & REHABILITATION     PROGRESS NOTE  Subjective/Complaints:  Pt seen laying in bed this AM.  She slept well overnight.  She is looking forward to therapies today, so that she can go home tomorrow.  Notes improvement in phantom limb pain with gabapentin.    ROS: Denies CP, SOB, N/V/D.  Objective: Vital Signs: Blood pressure (!) 173/59, pulse 75, temperature 98.6 F (37 C), temperature source Oral, resp. rate 16, height 5\' 5"  (1.651 m), weight 58.9 kg (129 lb 12.8 oz), SpO2 98 %. No results found. No results for input(s): WBC, HGB, HCT, PLT in the last 72 hours. No results for input(s): NA, K, CL, GLUCOSE, BUN, CREATININE, CALCIUM in the last 72 hours.  Invalid input(s): CO CBG (last 3)  No results for input(s): GLUCAP in the last 72 hours.  Wt Readings from Last 3 Encounters:  05/26/17 58.9 kg (129 lb 12.8 oz)  05/21/17 66.7 kg (147 lb)  04/22/17 63.5 kg (140 lb)    Physical Exam:  BP (!) 173/59 (BP Location: Left Arm)   Pulse 75   Temp 98.6 F (37 C) (Oral)   Resp 16   Ht 5\' 5"  (1.651 m)   Wt 58.9 kg (129 lb 12.8 oz)   SpO2 98%   BMI 21.60 kg/m  Constitutional: She appears well-developed. NAD. HENT: Normocephalic. Atraumatic Eyes: EOM are normal. No discharge.  Cardiovascular: RRR. No JVD. Respiratory: Effort normal and breath sounds normal.  GI: Soft. Bowel sounds are normal.  Musculoskeletal:  Right AKA Neurological: She is alert and oriented.  Motor: B/L UE 5/5 proximal to distal LLE: 4+/5 proximal to distal RLE: HF 4+/5 HF (unchanged) Skin:  Incision clean/dry/intact. Graft sites c/d/i.  Induration noted at proximal right site, no erythema, warmth. Psychiatric: She has a normal mood and affect. Her behavior is normal.   Assessment/Plan: 1. Functional deficits secondary to right AKA which require 3+ hours per day of interdisciplinary therapy in a comprehensive inpatient rehab setting. Physiatrist is providing close team supervision  and 24 hour management of active medical problems listed below. Physiatrist and rehab team continue to assess barriers to discharge/monitor patient progress toward functional and medical goals.  Function:  Bathing Bathing position   Position: Wheelchair/chair at sink  Bathing parts Body parts bathed by patient: Right arm, Left arm, Chest, Abdomen, Front perineal area, Buttocks, Right upper leg, Left upper leg, Left lower leg Body parts bathed by helper: Back  Bathing assist        Upper Body Dressing/Undressing Upper body dressing   What is the patient wearing?: Pull over shirt/dress, Bra Bra - Perfomed by patient: Thread/unthread right bra strap, Thread/unthread left bra strap, Hook/unhook bra (pull down sports bra)   Pull over shirt/dress - Perfomed by patient: Thread/unthread right sleeve, Thread/unthread left sleeve, Put head through opening, Pull shirt over trunk          Upper body assist Assist Level: Set up      Lower Body Dressing/Undressing Lower body dressing   What is the patient wearing?: Socks, Shoes, Pants, Underwear Underwear - Performed by patient: Thread/unthread right underwear leg, Thread/unthread left underwear leg, Pull underwear up/down   Pants- Performed by patient: Thread/unthread right pants leg, Thread/unthread left pants leg, Pull pants up/down   Non-skid slipper socks- Performed by patient: Don/doff left sock   Socks - Performed by patient: Don/doff left sock (right sock NA secondary to amputation)   Shoes - Performed by patient: Don/doff left shoe, Fasten  left            Lower body assist Assist for lower body dressing: Supervision or verbal cues      Toileting Toileting   Toileting steps completed by patient: Adjust clothing prior to toileting, Performs perineal hygiene, Adjust clothing after toileting   Toileting Assistive Devices: Grab bar or rail  Toileting assist Assist level: No help/no cues   Transfers Chair/bed transfer    Chair/bed transfer method: Squat pivot Chair/bed transfer assist level: Supervision or verbal cues Chair/bed transfer assistive device: Armrests, Medical sales representative     Max distance: 130 ft Assist level: Supervision or verbal cues   Wheelchair   Type: Manual Max wheelchair distance: 200 ft Assist Level: No help, No cues, assistive device, takes more than reasonable amount of time  Cognition Comprehension Comprehension assist level: Follows complex conversation/direction with no assist  Expression Expression assist level: Expresses complex ideas: With no assist  Social Interaction Social Interaction assist level: Interacts appropriately with others - No medications needed.  Problem Solving Problem solving assist level: Solves complex problems: Recognizes & self-corrects  Memory Memory assist level: Complete Independence: No helper    Medical Problem List and Plan:  1. Decreased functional mobility secondary to right AKA 05/21/2017 as well as PVD with multiple revascularization procedures   Cont CIR 2. DVT Prophylaxis/Anticoagulation: Eliquis resumed postoperatively  3. Pain Management: Ultram as needed   Gabapentin 100 TID started 6/9 with improvement 4. Mood: Provide emotional support  5. Neuropsych: This patient is capable of making decisions on her own behalf.  6. Skin/Wound Care: Routine skin checks   Monitor graft site for signs/symptoms of infection. 7. Fluids/Electrolytes/Nutrition: Routine I&Os 8. Atrial fibrillation. Cardiac rate controlled. Continue Eliquis  9. Hypertension.   Cardizem CD 180 mg daily  Tambocor 50 mg twice a day  Lisinopril 20 mg daily, increased to BID on 6/7.   Hydralazine 10 TID added 6/9, increased to 25 on 6/10  Monitor with increased mobility   Added PRN meds 10. CAD/NSTEMI. Continue aspirin. No chest pain or shortness of breath  11. ?Polycythemia vera: Follow labs 12. GERD. Protonix  13. Hypoalbuminemia  Supplement  initiated 6/6 14. Leukocytosis  WBCs 18.8 on 6/6  Afebrile  Cont to monitor  Labs ordered for tomorrow 15. ABLA  Hb 9.3 on 6/6  Cont to monitor  Labs ordered for tomorrow  LOS (Days) 5 A FACE TO FACE EVALUATION WAS PERFORMED  Arita Severtson Lorie Phenix 05/30/2017 8:20 AM

## 2017-05-30 NOTE — Progress Notes (Signed)
Physical Therapy Discharge Summary  Patient Details  Name: [name] MRN: [id number] Date of Birth: 12-16-1951  Today's Date: 05/30/2017 PT Individual Time: 7096-2836 PT Individual Time Calculation (min): 74 min    Patient has met 9 of 9 long term goals due to improved activity tolerance, improved balance, improved postural control, increased strength, decreased pain, ability to compensate for deficits, improved attention and improved awareness.  Patient to discharge at an ambulatory level Modified Independent.   Patient's care partner to provide the necessary physical assistance at discharge for stair negotiation into and out of house.  Reasons goals not met: N/A  Recommendation:  Patient will benefit from ongoing skilled PT services in home health setting to continue to advance safe functional mobility, address ongoing impairments in cardiovascular endurance, stair negotiation, dynamic balance, and residual limb car and minimize fall risk.  Equipment: Patient owns rolling walker will have a wheelchair upon discharge   Reasons for discharge: treatment goals met  Patient/family agrees with progress made and goals achieved: Yes  PT Discharge Precautions/Restrictions Precautions Precautions: Fall Restrictions Weight Bearing Restrictions: Yes RLE Weight Bearing: Weight bearing as tolerated Vital Signs Therapy Vitals Pulse Rate: 72 BP: (!) 154/44 Patient Position (if appropriate): Sitting Oxygen Therapy SpO2: 99 % O2 Device: Not Delivered Pain Pain Assessment Pain Assessment: No/denies pain Pain Score: 0-No pain     Cognition Overall Cognitive Status: Within Functional Limits for tasks assessed Arousal/Alertness: Awake/alert Orientation Level: Oriented X4 Sensation Sensation Light Touch: Appears Intact Proprioception: Appears Intact Motor  Motor Motor: Within Functional Limits  Mobility Bed Mobility Bed Mobility: Rolling Right;Rolling Left;Supine to Sit;Sit to  Supine;Sitting - Scoot to Edge of Bed Rolling Right: 7: Independent Rolling Left: 7: Independent Supine to Sit: 7: Independent Sitting - Scoot to Edge of Bed: 7: Independent Sit to Supine: 7: Independent Transfers Transfers: Yes Sit to Stand: 6: Modified independent (Device/Increase time) Stand to Sit: 6: Modified independent (Device/Increase time) Locomotion  Ambulation Ambulation: Yes Ambulation/Gait Assistance: 6: Modified independent (Device/Increase time) Ambulation Distance (Feet): 150 Feet Assistive device: Rolling walker Gait Gait: Yes Gait Pattern:  (Right above the knee amputation Hop to pattern with RW forward flexed posture. ) Gait Pattern: Step-to pattern Stairs / Additional Locomotion Stairs: Yes Stairs Assistance: 4: Min assist Stairs Assistance Details: Tactile cues for weight shifting;Verbal cues for sequencing;Verbal cues for technique;Verbal cues for precautions/safety;Verbal cues for safe use of DME/AE Stair Management Technique: With walker Number of Stairs: 8 Height of Stairs: 3 Ramp: 6: Modified independent Water quality scientist) Architect: Yes Wheelchair Assistance: 6: Modified independent (Device/Increase time) Environmental health practitioner: Both upper extremities  Trunk/Postural Assessment  Cervical Assessment Cervical Assessment: Within Water engineer Thoracic Assessment: Within Functional Limits Lumbar Assessment Lumbar Assessment: Within Functional Limits Postural Control Postural Control: Within Functional Limits  Balance Balance Balance Assessed: Yes Static Sitting Balance Static Sitting - Balance Support: No upper extremity supported Static Sitting - Level of Assistance: 7: Independent Dynamic Sitting Balance Dynamic Sitting - Balance Support: During functional activity Dynamic Sitting - Level of Assistance: 6: Modified independent (Device/Increase time) Static Standing Balance Static Standing - Balance  Support: During functional activity;Bilateral upper extremity supported Static Standing - Level of Assistance: 6: Modified independent (Device/Increase time) Dynamic Standing Balance Dynamic Standing - Balance Support: During functional activity;Bilateral upper extremity supported Dynamic Standing - Level of Assistance: 6: Modified independent (Device/Increase time) Extremity Assessment     Right residual intact healing appropriately.  Desensitization techniques right residual limb 5 minutes; patient educated on proper  technique in order to decrease phantom lib pain/sensation.   Supine therex: B hip flexion 2x15 B hip abduction 2x15   Patient demonstrated independence with residual limb dressing change and wrapping as well as donning/doffing sock wrap.        See Function Navigator for Current Functional Status.  Retta Diones 05/30/2017, 10:08 AM

## 2017-05-31 LAB — CBC WITH DIFFERENTIAL/PLATELET
BASOS PCT: 1 %
Basophils Absolute: 0.3 10*3/uL — ABNORMAL HIGH (ref 0.0–0.1)
Eosinophils Absolute: 1.3 10*3/uL — ABNORMAL HIGH (ref 0.0–0.7)
Eosinophils Relative: 5 %
HEMATOCRIT: 36.2 % (ref 36.0–46.0)
Hemoglobin: 10.6 g/dL — ABNORMAL LOW (ref 12.0–15.0)
LYMPHS PCT: 7 %
Lymphs Abs: 1.8 10*3/uL (ref 0.7–4.0)
MCH: 21.7 pg — ABNORMAL LOW (ref 26.0–34.0)
MCHC: 29.3 g/dL — ABNORMAL LOW (ref 30.0–36.0)
MCV: 74.2 fL — AB (ref 78.0–100.0)
MONO ABS: 0.5 10*3/uL (ref 0.1–1.0)
Monocytes Relative: 2 %
NEUTROS PCT: 85 %
Neutro Abs: 21.2 10*3/uL — ABNORMAL HIGH (ref 1.7–7.7)
Platelets: 522 10*3/uL — ABNORMAL HIGH (ref 150–400)
RBC: 4.88 MIL/uL (ref 3.87–5.11)
RDW: 22.3 % — ABNORMAL HIGH (ref 11.5–15.5)
WBC: 25.1 10*3/uL — ABNORMAL HIGH (ref 4.0–10.5)

## 2017-05-31 LAB — BASIC METABOLIC PANEL
Anion gap: 11 (ref 5–15)
BUN: 18 mg/dL (ref 6–20)
CHLORIDE: 101 mmol/L (ref 101–111)
CO2: 25 mmol/L (ref 22–32)
CREATININE: 0.86 mg/dL (ref 0.44–1.00)
Calcium: 8.8 mg/dL — ABNORMAL LOW (ref 8.9–10.3)
GFR calc non Af Amer: >60 mL/min (ref >60)
Glucose, Bld: 122 mg/dL — ABNORMAL HIGH (ref 65–99)
Potassium: 4.1 mmol/L (ref 3.5–5.1)
Sodium: 137 mmol/L (ref 135–145)

## 2017-05-31 MED ORDER — GABAPENTIN 100 MG PO CAPS: 100.0000 mg | ORAL_CAPSULE | Freq: Three times a day (TID) | ORAL | 0 refills | Status: DC

## 2017-05-31 MED ORDER — EZETIMIBE 10 MG PO TABS: 10.0000 mg | ORAL_TABLET | Freq: Every day | ORAL | 0 refills | Status: DC

## 2017-05-31 MED ORDER — DILTIAZEM HCL ER COATED BEADS 180 MG PO CP24: 180.0000 mg | ORAL_CAPSULE | Freq: Every morning | ORAL | 1 refills | Status: DC

## 2017-05-31 MED ORDER — APIXABAN 5 MG PO TABS: 5.0000 mg | ORAL_TABLET | Freq: Two times a day (BID) | ORAL | 1 refills | Status: DC

## 2017-05-31 MED ORDER — LISINOPRIL 20 MG PO TABS: 20.0000 mg | ORAL_TABLET | Freq: Two times a day (BID) | ORAL | 0 refills | Status: DC

## 2017-05-31 MED ORDER — HYDRALAZINE HCL 25 MG PO TABS: 25.0000 mg | ORAL_TABLET | Freq: Three times a day (TID) | ORAL | 0 refills | Status: DC

## 2017-05-31 MED ORDER — CYCLOBENZAPRINE HCL 5 MG PO TABS: 5.0000 mg | ORAL_TABLET | Freq: Three times a day (TID) | ORAL | 0 refills | Status: DC | PRN

## 2017-05-31 MED ORDER — TRAMADOL HCL 50 MG PO TABS: 50.0000 mg | ORAL_TABLET | Freq: Four times a day (QID) | ORAL | 0 refills | Status: DC | PRN

## 2017-05-31 MED ORDER — OMEPRAZOLE 20 MG PO CPDR: 20.0000 mg | DELAYED_RELEASE_CAPSULE | Freq: Two times a day (BID) | ORAL | 1 refills | Status: DC

## 2017-05-31 MED ORDER — FLECAINIDE ACETATE 50 MG PO TABS: 50.0000 mg | ORAL_TABLET | Freq: Two times a day (BID) | ORAL | 0 refills | Status: DC

## 2017-05-31 MED ORDER — DICLOFENAC SODIUM 1 % TD GEL: 2.0000 g | Freq: Four times a day (QID) | TRANSDERMAL | 0 refills | Status: DC

## 2017-05-31 NOTE — Progress Notes (Signed)
Pt d/c to home with personal belongings and equipment.  D/C instructions provided to pt and sister Silvestre Mesi [facility] with verbalization of understanding, medications, follow ups, and dressing changes as well as care.

## 2017-05-31 NOTE — Progress Notes (Signed)
[facility] PHYSICAL MEDICINE & REHABILITATION     PROGRESS NOTE  Subjective/Complaints:  Pt seen laying in bed this AM.  She slept well overnight.  She is all packed up and ready for discharge.   ROS: Denies CP, SOB, N/V/D.  Objective: Vital Signs: Blood pressure (!) 128/46, pulse 64, temperature 97.9 F (36.6 C), temperature source Oral, resp. rate 18, height 5\' 5"  (1.651 m), weight 58.9 kg (129 lb 12.8 oz), SpO2 97 %. No results found. No results for input(s): WBC, HGB, HCT, PLT in the last 72 hours. No results for input(s): NA, K, CL, GLUCOSE, BUN, CREATININE, CALCIUM in the last 72 hours.  Invalid input(s): CO CBG (last 3)  No results for input(s): GLUCAP in the last 72 hours.  Wt Readings from Last 3 Encounters:  05/26/17 58.9 kg (129 lb 12.8 oz)  05/21/17 66.7 kg (147 lb)  04/22/17 63.5 kg (140 lb)    Physical Exam:  BP (!) 128/46 (BP Location: Left Arm)   Pulse 64   Temp 97.9 F (36.6 C) (Oral)   Resp 18   Ht 5\' 5"  (1.651 m)   Wt 58.9 kg (129 lb 12.8 oz)   SpO2 97%   BMI 21.60 kg/m  Constitutional: She appears well-developed. NAD. HENT: Normocephalic. Atraumatic Eyes: EOM are normal. No discharge.  Cardiovascular: RRR. No JVD. Respiratory: Effort normal and breath sounds normal.  GI: Soft. Bowel sounds are normal.  Musculoskeletal:  Right AKA Neurological: She is alert and oriented.  Motor: B/L UE 5/5 proximal to distal LLE: 5/5 proximal to distal RLE: HF 4+/5 HF (improving) Skin:  Incision clean/dry/intact. Graft sites c/d/i.  Induration noted at proximal right site, no erythema, warmth (stable). Psychiatric: She has a normal mood and affect. Her behavior is normal.   Assessment/Plan: 1. Functional deficits secondary to right AKA which require 3+ hours per day of interdisciplinary therapy in a comprehensive inpatient rehab setting. Physiatrist is providing close team supervision and 24 hour management of active medical problems listed below. Physiatrist  and rehab team continue to assess barriers to discharge/monitor patient progress toward functional and medical goals.  Function:  Bathing Bathing position   Position: Wheelchair/chair at sink (per pt report/simulated with OT)  Bathing parts Body parts bathed by patient: Right arm, Left arm, Chest, Abdomen, Front perineal area, Buttocks, Right upper leg, Left upper leg, Left lower leg Body parts bathed by helper: Back  Bathing assist Assist Level: No help, No cues      Upper Body Dressing/Undressing Upper body dressing   What is the patient wearing?: Pull over shirt/dress (per pt report) Bra - Perfomed by patient: Thread/unthread right bra strap, Thread/unthread left bra strap, Hook/unhook bra (pull down sports bra)   Pull over shirt/dress - Perfomed by patient: Thread/unthread right sleeve, Thread/unthread left sleeve, Put head through opening, Pull shirt over trunk          Upper body assist Assist Level: No help, No cues      Lower Body Dressing/Undressing Lower body dressing   What is the patient wearing?: Socks, Shoes, Pants, Underwear (per pt report) Underwear - Performed by patient: Thread/unthread right underwear leg, Thread/unthread left underwear leg, Pull underwear up/down   Pants- Performed by patient: Thread/unthread right pants leg, Thread/unthread left pants leg, Pull pants up/down   Non-skid slipper socks- Performed by patient: Don/doff left sock   Socks - Performed by patient: Don/doff left sock   Shoes - Performed by patient: Don/doff left shoe, Fasten left  Lower body assist Assist for lower body dressing: More than reasonable time      Toileting Toileting   Toileting steps completed by patient: Adjust clothing prior to toileting, Performs perineal hygiene, Adjust clothing after toileting   Toileting Assistive Devices: Grab bar or rail  Toileting assist Assist level: No help/no cues   Transfers Chair/bed transfer   Chair/bed transfer  method: Stand pivot Chair/bed transfer assist level: No Help, no cues, assistive device, takes more than a reasonable amount of time Chair/bed transfer assistive device: Armrests, Medical sales representative     Max distance: 150 Assist level: No help, No cues, assistive device, takes more than a reasonable amount of time   Wheelchair   Type: Manual Max wheelchair distance: 300 Assist Level: No help, No cues, assistive device, takes more than reasonable amount of time  Cognition Comprehension Comprehension assist level: Follows complex conversation/direction with no assist  Expression Expression assist level: Expresses complex ideas: With no assist  Social Interaction Social Interaction assist level: Interacts appropriately with others - No medications needed.  Problem Solving Problem solving assist level: Solves complex problems: Recognizes & self-corrects  Memory Memory assist level: Complete Independence: No helper    Medical Problem List and Plan:  1. Decreased functional mobility secondary to right AKA 05/21/2017 as well as PVD with multiple revascularization procedures   D/c today  Will see patient for transitional care management in 1-2 weeks 2. DVT Prophylaxis/Anticoagulation: Eliquis resumed postoperatively  3. Pain Management: Ultram as needed   Gabapentin 100 TID started 6/9 with improvement 4. Mood: Provide emotional support  5. Neuropsych: This patient is capable of making decisions on her own behalf.  6. Skin/Wound Care: Routine skin checks   Monitor graft site for signs/symptoms of infection. 7. Fluids/Electrolytes/Nutrition: Routine I&Os 8. Atrial fibrillation. Cardiac rate controlled. Continue Eliquis  9. Hypertension.   Cardizem CD 180 mg daily  Tambocor 50 mg twice a day  Lisinopril 20 mg daily, increased to BID on 6/7.   Hydralazine 10 TID added 6/9, increased to 25 on 6/10  Monitor with increased mobility   Added PRN meds  Labile, but significantly  improved, will need ambulatory monitoring 10. CAD/NSTEMI. Continue aspirin. No chest pain or shortness of breath  11. ?Polycythemia vera: Follow labs 12. GERD. Protonix  13. Hypoalbuminemia  Supplement initiated 6/6 14. Leukocytosis  WBCs 18.8 on 6/6  Afebrile  Cont to monitor  Labs pending 15. ABLA  Hb 9.3 on 6/6  Cont to monitor  Labs pending  LOS (Days) 6 A FACE TO FACE EVALUATION WAS PERFORMED  [name] Lorie Phenix 05/31/2017 9:08 AM

## 2017-05-31 NOTE — Progress Notes (Signed)
Ready for discharge. Pain managed with scheduled meds. [name] A

## 2017-06-02 ENCOUNTER — Encounter: Payer: Self-pay | Admitting: Vascular Surgery

## 2017-06-02 ENCOUNTER — Telehealth: Payer: Self-pay

## 2017-06-07 NOTE — Telephone Encounter (Signed)
Error

## 2017-06-15 ENCOUNTER — Encounter: Payer: Self-pay | Admitting: Vascular Surgery

## 2017-06-15 ENCOUNTER — Ambulatory Visit (INDEPENDENT_AMBULATORY_CARE_PROVIDER_SITE_OTHER): Payer: Self-pay | Admitting: Vascular Surgery

## 2017-06-15 VITALS — BP 130/78 | HR 87 | Temp 98.2°F | Resp 18 | Ht 65.0 in | Wt 129.0 lb

## 2017-06-15 DIAGNOSIS — I739 Peripheral vascular disease, unspecified: Secondary | ICD-10-CM

## 2017-06-15 NOTE — Progress Notes (Signed)
Patient name: [name] MRN: [id number] DOB: [birth year] Sex: female  REASON FOR VISIT: Follow-up right above-knee amputation 05/21/2017  HPI: [name] is a 66 y.o. female with a long history of extensive peripheral vascular disease with multiple prior revascularization attempts. Presented on 531 with profound ischemia. Underwent attempted revascularization which was unsuccessful. Subsequently underwent above-knee amputation on 05/21/2017. She did very well the hospital and an inpatient rehabilitation and was discharged home. She looks quite good today. She has a very good attitude  Current Outpatient Prescriptions  Medication Sig Dispense Refill  . acetaminophen (TYLENOL) 500 MG tablet Take 1,000 mg by mouth every 6 (six) hours as needed for moderate pain.     Marland Kitchen apixaban (ELIQUIS) 5 MG TABS tablet Take 1 tablet (5 mg total) by mouth 2 (two) times daily. 60 tablet 1  . ARTIFICIAL TEAR OP Place 1 drop into both eyes as needed (for dry eyes).    Marland Kitchen aspirin EC 325 MG tablet Take 325 mg by mouth daily.    . cyclobenzaprine (FLEXERIL) 5 MG tablet Take 1 tablet (5 mg total) by mouth 3 (three) times daily as needed for muscle spasms. 60 tablet 0  . diclofenac sodium (VOLTAREN) 1 % GEL Apply 2 g topically 4 (four) times daily. 1 Tube 0  . diltiazem (CARDIZEM CD) 180 MG 24 hr capsule Take 1 capsule (180 mg total) by mouth every morning. 30 capsule 1  . docusate sodium (COLACE) 100 MG capsule Take 200 mg by mouth every evening.     . ezetimibe (ZETIA) 10 MG tablet Take 1 tablet (10 mg total) by mouth at bedtime. PLEASE CONTACT OFFICE FOR ADDITIONAL REFILLS 30 tablet 0  . fenofibrate (TRICOR) 145 MG tablet Take 1 tablet (145 mg total) by mouth at bedtime. PLEASE CONTACT OFFICE FOR ADDITIONAL REFILLS 7 tablet 0  . ferrous sulfate 325 (65 FE) MG tablet Take 325 mg by mouth every evening.    . flecainide (TAMBOCOR) 50 MG tablet Take 1 tablet (50 mg total) by mouth 2  (two) times daily. PLEASE CONTACT OFFICE FOR ADDITIONAL REFILLS 60 tablet 0  . gabapentin (NEURONTIN) 100 MG capsule Take 1 capsule (100 mg total) by mouth 3 (three) times daily. 90 capsule 0  . hydrALAZINE (APRESOLINE) 25 MG tablet Take 1 tablet (25 mg total) by mouth every 8 (eight) hours. 90 tablet 0  . lisinopril (PRINIVIL,ZESTRIL) 20 MG tablet Take 1 tablet (20 mg total) by mouth 2 (two) times daily. 60 tablet 0  . omeprazole (PRILOSEC) 20 MG capsule Take 1 capsule (20 mg total) by mouth 2 (two) times daily before a meal. 60 capsule 1  . traMADol (ULTRAM) 50 MG tablet Take 1 tablet (50 mg total) by mouth every 6 (six) hours as needed. Currently: taking med between 2-3 times daily for pain 60 tablet 0   No current facility-administered medications for this visit.      PHYSICAL EXAM: Vitals:   06/15/17 0829  BP: 130/78  Pulse: 87  Resp: 18  Temp: 98.2 F (36.8 C)  TempSrc: Oral  SpO2: 97%  Weight: 129 lb (58.5 kg)  Height: 5\' 5"  (1.651 m)    GENERAL: The patient is a well-nourished female, in no acute distress. The vital signs are documented above. Right above-knee amputation is completely healed. Right groin incision and left thigh vein harvest incision completely healed.  MEDICAL ISSUES: Stable overall. For staple removal today. Continue follow-up with rehabilitation as an outpatient with plan for eventual above-knee prosthesis.  We'll see [place] again on an as-needed basis   Rosetta Posner, MD [facility] Tel [phone] Pager [phone]

## 2017-06-25 ENCOUNTER — Encounter: Payer: Medicare Other | Attending: Physical Medicine & Rehabilitation | Admitting: Physical Medicine & Rehabilitation

## 2017-06-25 ENCOUNTER — Encounter: Payer: Self-pay | Admitting: Physical Medicine & Rehabilitation

## 2017-06-25 VITALS — BP 125/71 | HR 86 | Resp 14

## 2017-06-25 DIAGNOSIS — I480 Paroxysmal atrial fibrillation: Secondary | ICD-10-CM | POA: Insufficient documentation

## 2017-06-25 DIAGNOSIS — G546 Phantom limb syndrome with pain: Secondary | ICD-10-CM

## 2017-06-25 DIAGNOSIS — I2510 Atherosclerotic heart disease of native coronary artery without angina pectoris: Secondary | ICD-10-CM | POA: Diagnosis not present

## 2017-06-25 DIAGNOSIS — I252 Old myocardial infarction: Secondary | ICD-10-CM | POA: Insufficient documentation

## 2017-06-25 DIAGNOSIS — Z959 Presence of cardiac and vascular implant and graft, unspecified: Secondary | ICD-10-CM | POA: Diagnosis not present

## 2017-06-25 DIAGNOSIS — E785 Hyperlipidemia, unspecified: Secondary | ICD-10-CM | POA: Diagnosis not present

## 2017-06-25 DIAGNOSIS — Z9181 History of falling: Secondary | ICD-10-CM | POA: Insufficient documentation

## 2017-06-25 DIAGNOSIS — Z8673 Personal history of transient ischemic attack (TIA), and cerebral infarction without residual deficits: Secondary | ICD-10-CM | POA: Diagnosis not present

## 2017-06-25 DIAGNOSIS — Z87891 Personal history of nicotine dependence: Secondary | ICD-10-CM | POA: Diagnosis not present

## 2017-06-25 DIAGNOSIS — R269 Unspecified abnormalities of gait and mobility: Secondary | ICD-10-CM | POA: Diagnosis not present

## 2017-06-25 DIAGNOSIS — D45 Polycythemia vera: Secondary | ICD-10-CM

## 2017-06-25 DIAGNOSIS — I739 Peripheral vascular disease, unspecified: Secondary | ICD-10-CM | POA: Insufficient documentation

## 2017-06-25 DIAGNOSIS — I10 Essential (primary) hypertension: Secondary | ICD-10-CM

## 2017-06-25 DIAGNOSIS — K219 Gastro-esophageal reflux disease without esophagitis: Secondary | ICD-10-CM | POA: Insufficient documentation

## 2017-06-25 DIAGNOSIS — Z89611 Acquired absence of right leg above knee: Secondary | ICD-10-CM

## 2017-06-25 DIAGNOSIS — H409 Unspecified glaucoma: Secondary | ICD-10-CM | POA: Insufficient documentation

## 2017-06-25 DIAGNOSIS — W19XXXD Unspecified fall, subsequent encounter: Secondary | ICD-10-CM

## 2017-06-25 NOTE — Progress Notes (Addendum)
Subjective:    Patient ID: ANSHIKA PETHTEL, female    DOB: 1951-11-03, 66 y.o.   MRN: [id number]  HPI  66 year old right-handed female, history of polycythemia vera, diagnosed July 2016, CAD, hypertension, atrial fibrillation presents for hospital follow up after receiving CIR for right AKA.  DATE OF ADMISSION:  05/25/2017 DATE OF DISCHARGE:  05/31/2017  Since discharge she was released by Dr. Donnetta Hutching. She saw her PCP as well. She states she going to be seen next week for prosthetic fitting.  She continues to have phantom limb pain, which is helped with Gabapentin.  BP is controlled.  Dr. Waymon Budge is following with labs. She had 2 falls, not following on her stump, trying to do things she knows she should not have.   DME: Tub bench Therapies: Mobility: Walker home/wheelchair in community.  Pain Inventory Average Pain 4 Pain Right Now 6 My pain is sharp, stabbing, tingling and aching  In the last 24 hours, has pain interfered with the following? General activity 0 Relation with others 0 Enjoyment of life 0 What TIME of day is your pain at its worst? evening, night Sleep (in general) Fair  Pain is worse with: sitting Pain improves with: rest Relief from Meds: 8  Mobility walk without assistance walk with assistance use a walker how many minutes can you walk? 5 ability to climb steps?  no do you drive?  no use a wheelchair transfers alone Do you have any goals in this area?  yes  Function disabled: date disabled . I need assistance with the following:  household duties  Neuro/Psych trouble walking  Prior Studies hospital f/u  Physicians involved in your care hospital f/u   Family History  Problem Relation Age of Onset  . CAD Mother   . Lung cancer Mother   . Heart attack Mother   . CAD Father   . Heart disease Father   . Heart attack Father 28       died in hes 78's with heart disease  . Healthy Sister   . Liver cancer Brother   . Healthy Sister   .  CAD Brother        has had CABG  . CAD Brother    Social History   Social History  . Marital status: Widowed    Spouse name: N/A  . Number of children: 2  . Years of education: 12   Occupational History  . CNA    Social History Main Topics  . Smoking status: Former Smoker    Packs/day: 1.00    Years: 45.00    Types: Cigarettes    Quit date: 11/11/2012  . Smokeless tobacco: Never Used     Comment: quit 4 years ago  . Alcohol use No  . Drug use: No  . Sexual activity: Not Currently    Birth control/ protection: None   Other Topics Concern  . None   Social History Narrative  . None   Past Surgical History:  Procedure Laterality Date  . AMPUTATION Right 05/21/2017   Procedure: AMPUTATION ABOVE KNEE;  Surgeon: Rosetta Posner, MD;  Location: [place];  Service: Vascular;  Laterality: Right;  . CARDIAC CATHETERIZATION  04/27/13   non occlusive disease with mild to mod. calcified lesions in ostial LM and proximal LAD and moderate prox. RC AND DISTAL AV GROOVE LCX, ef  . CATARACT EXTRACTION W/PHACO Right 03/21/2015   Procedure: CATARACT EXTRACTION PHACO AND INTRAOCULAR LENS PLACEMENT RIGHT EYE;  Surgeon: Baruch Goldmann,  MD;  Location: [facility];  Service: Ophthalmology;  Laterality: Right;  CDE:5.10  . CATARACT EXTRACTION W/PHACO Left 05/16/2015   Procedure: CATARACT EXTRACTION PHACO AND INTRAOCULAR LENS PLACEMENT (IOC);  Surgeon: Baruch Goldmann, MD;  Location: [facility];  Service: Ophthalmology;  Laterality: Left;  CDE 5.57  . [place]  . COLONOSCOPY N/A 09/11/2016   Procedure: COLONOSCOPY;  Surgeon: Rogene Houston, MD;  [facility];  Service: Endoscopy;  Laterality: N/A;  730-moved to 1:00 Ann notified pt  . EMBOLECTOMY Right 02/01/2016   Procedure: Thrombectomy of Right Femoral-Popliteal Bypass Graft; Endarterectomy of Right Below Knee Popliteal Artery and Tibial Peroneal Trunk with Bovine Pericardium Patch Angioplasty.;  Surgeon: Angelia Mould, MD;  Location:  [place];  Service: Vascular;  Laterality: Right;  . FEMORAL-POPLITEAL BYPASS GRAFT Right 01/31/2016   Procedure: BYPASS GRAFT RIGHT COMMON  FEMORALTO BELOW KNEE POPLITEAL ARTERY BYPASS GRAFT USING 6MM PROPATEN GORTEX GRAFT;  Surgeon: Mal Misty, MD;  Location: [place];  Service: Vascular;  Laterality: Right;  . FEMORAL-POPLITEAL BYPASS GRAFT Right 01/09/2017   Procedure: THROMBECTOMY OF RIGHT FEMORAL-POPLITEAL  ARTERY BYPASS GRAFT; THROMBECTOMY RIGHT  TIBIAL VESSELS;  Surgeon: [name], MD;  Location: [place];  Service: Vascular;  Laterality: Right;  . FEMORAL-POPLITEAL BYPASS GRAFT Right 05/17/2017   Procedure: RIGHT FEMORAL-TIBIAL PERONEAL TRUNK ARTERY BYPASS GRAFT USING 35mmX80cm PROPATEN GRAFT WITH REMOVABLE RING;  Surgeon: Rosetta Posner, MD;  Location: [place];  Service: Vascular;  Laterality: Right;  . FRACTURE SURGERY    . ILIAC ARTERY STENT  07/2006   external iliac and Rt SFA stent 07/2006 AND THE LEFT WAS IN 2006  . KNEE ARTHROSCOPY WITH MEDIAL MENISECTOMY Left 03/27/2014   Procedure: left knee arthorscopy with medial chondraplasty of the medial femoral and patella, medial microfracture technique of medial femoral condyl;  Surgeon: Tobi Bastos, MD;  Location: [facility];  Service: Orthopedics;  Laterality: Left;  . LEFT HEART CATHETERIZATION WITH CORONARY ANGIOGRAM Right 04/27/2013   Procedure: LEFT HEART CATHETERIZATION WITH CORONARY ANGIOGRAM;  Surgeon: Leonie Man, MD;  Location: [facility];  Service: Cardiovascular;  Laterality: Right;  . LOWER EXTREMITY ANGIOGRAM Right 01/31/2016   Procedure: INTRAOP RIGHT LOWER EXTREMITY ANGIOGRAM;  Surgeon: Mal Misty, MD;  Location: [name];  Service: Vascular;  Laterality: Right;  . ORIF WRIST FRACTURE Right 1983  . OVARIAN CYST REMOVAL Right   . PATCH ANGIOPLASTY Right 01/31/2016   Procedure: RIGHT COMMON FEMORAL AND PROFUNDA FEMORIS ENDARECTOMY WITH PATCH ANGIOPLASTY;  Surgeon: Mal Misty, MD;  Location: [place];  Service: Vascular;   Laterality: Right;  . PATCH ANGIOPLASTY Right 01/09/2017   Procedure: PATCH ANGIOPLASTY RIGHT POPLITEAL ARTERY BYPASS GRAFT;  Surgeon: [name], MD;  Location: [place];  Service: Vascular;  Laterality: Right;  . PERIPHERAL VASCULAR CATHETERIZATION N/A 06/27/2015   Procedure: Lower Extremity Angiography;  Surgeon: Lorretta Harp, MD;  Location: [place] [facility];  Service: Cardiovascular;  Laterality: N/A;  . PERIPHERAL VASCULAR CATHETERIZATION Bilateral 01/29/2016   Procedure: Lower Extremity Angiography;  Surgeon: Wellington Hampshire, MD;  Location: [place] [facility];  Service: Cardiovascular;  Laterality: Bilateral;  . PERIPHERAL VASCULAR CATHETERIZATION N/A 01/29/2016   Procedure: Abdominal Aortogram;  Surgeon: Wellington Hampshire, MD;  Location: [place] [facility];  Service: Cardiovascular;  Laterality: N/A;  . REFRACTIVE SURGERY Bilateral    "6 in one eye; 7 in the other; to relieve pressure; not glaucoma" (01/28/2016)  . SFA Right 06/27/2015  overlapping Bahn covered stents  . TUBAL LIGATION  1983  . VEIN HARVEST Left 05/17/2017   Procedure: LEFT LEG GREATER SAPHENOUS VEIN HARVEST;  Surgeon: Rosetta Posner, MD;  Location: [place];  Service: Vascular;  Laterality: Left;  Marland Kitchen VEIN REPAIR Right 01/31/2016   Procedure: RIGHT GREATER SAPHENOUS VEIN EXAMNED BUT NOT REMOVED;  Surgeon: Mal Misty, MD;  Location: [place];  Service: Vascular;  Laterality: Right;   Past Medical History:  Diagnosis Date  . Arthritis    "fingers, some in my knees" (01/28/2016)  . CAD (coronary artery disease), per cath 04/27/13, non obstructive disease, EF 65-70% 05/11/2013  . Carotid disease, bilateral ([place]) 09/2013   Lt carotid 50-69% stentosis, less on rt  . Dysrhythmia   . Fatty liver   . GERD (gastroesophageal reflux disease)   . Glaucoma   . H/O cardiovascular stress test 12/27/2009   negative for ischemia  . Headache    "occasional since eye pressure regulated" (01/28/2016)  . Hyperlipidemia   . Hypertension   .  Leukocytosis 07/15/2015  . Migraine    "stopped w/laser holes to relieve pressure in my eyes; had them 3-4 times/wk before taht" (01/28/2016)  . Mitral regurgitation,2+ by cath 05/11/2013  . NSTEMI (non-ST elevated myocardial infarction) ([place]) 04/27/2013  . Pain    BOTH KNEES - PT HAS TORN MENISCUS LEFT KNEE  . Paroxysmal atrial fibrillation (HCC)    hx of a fib on ASA  AND ELIQUIS   . Polycythemia vera ([place]) 08/20/2015   JAK-2 positive 07/19/15  . PVD (peripheral vascular disease) with claudication ([place]) 07/2006   previous L ext iliac stent and rt SFA stent, known occluded Lt SFA  . S/P cardiac cath 04/27/13   NON OBSTRUCTIVE DISEASE, 2+ mr, ef 65-70%  . Statin intolerance   . Stroke ([place]) 2006   SWELLING ALL OVER AND RT SIDE OF FACE DRAWN AND SPEECH SLURRED AND NUMBNESS ON RIGHT SIDE-- ALL RESOLVED   BP 125/71 (BP Location: Left Arm, Patient Position: Sitting, Cuff Size: Normal)   Pulse 86   Resp 14   SpO2 96%   Opioid Risk Score:   Fall Risk Score:  `1  Depression screen PHQ 2/9  Depression screen [facility] 2/9 03/09/2017 10/13/2016 04/07/2016 10/29/2015 08/20/2015 07/15/2015  Decreased Interest 0 0 0 0 0 0  Down, Depressed, Hopeless 0 0 0 0 0 0  PHQ - 2 Score 0 0 0 0 0 0    Review of Systems  Constitutional: Negative.   HENT: Negative.   Respiratory: Negative.   Cardiovascular: Positive for leg swelling.  Gastrointestinal: Negative.   Endocrine: Negative.   Genitourinary: Negative.   Musculoskeletal: Positive for gait problem.  Skin: Negative.   Allergic/Immunologic: Negative.   Hematological: Negative.   Psychiatric/Behavioral: Negative.   All other systems reviewed and are negative.     Objective:   Physical Exam Constitutional: She appears well-developed. NAD. HENT: Normocephalic. Atraumatic Eyes: EOM are normal. No discharge.  Cardiovascular: RRR. No JVD. Respiratory: Effort normal and breath sounds normal.  GI: Soft. Bowel sounds are normal.  Musculoskeletal:  Right  AKA Neurological: She is alert and oriented.  Motor: B/L UE 5/5 proximal to distal LLE: 5/5 proximal to distal RLE: HF 5/5 HF  Skin: Incision healed. Graft sites c/d/i.Marland Kitchen Psychiatric: She has a normal mood and affect. Her behavior is normal.      Assessment & Plan:  66 year old right-handed female, history of polycythemia vera, diagnosed July 2016, CAD, hypertension, atrial fibrillation presents  for hospital follow up after receiving CIR for right AKA.  1. Right AKA 05/21/2017 as well as PVD with multiple revascularization procedures   Cont therapies  Pt to have prosthetic fitting next week.  2. Phantom limb Pain:   Cont Gabapentin 100 TID PRN  3. Hypertension.   Cont meds  Controlled at present  4. Polycythemia vera  Cont follow up Heme/Onc  5. Gait abnormality with falls  Cont walker/wheelchair for safety  Cont therapies  Educated on safety to avoid falls

## 2017-07-06 ENCOUNTER — Ambulatory Visit (INDEPENDENT_AMBULATORY_CARE_PROVIDER_SITE_OTHER): Payer: Medicare Other | Admitting: Oncology

## 2017-07-06 ENCOUNTER — Encounter: Payer: Self-pay | Admitting: Oncology

## 2017-07-06 VITALS — BP 102/51 | HR 84 | Temp 98.2°F | Ht 65.5 in | Wt 122.0 lb

## 2017-07-06 DIAGNOSIS — K219 Gastro-esophageal reflux disease without esophagitis: Secondary | ICD-10-CM | POA: Diagnosis not present

## 2017-07-06 DIAGNOSIS — Z87891 Personal history of nicotine dependence: Secondary | ICD-10-CM | POA: Diagnosis not present

## 2017-07-06 DIAGNOSIS — D508 Other iron deficiency anemias: Secondary | ICD-10-CM | POA: Diagnosis not present

## 2017-07-06 DIAGNOSIS — Z8673 Personal history of transient ischemic attack (TIA), and cerebral infarction without residual deficits: Secondary | ICD-10-CM

## 2017-07-06 DIAGNOSIS — E785 Hyperlipidemia, unspecified: Secondary | ICD-10-CM

## 2017-07-06 DIAGNOSIS — Z7901 Long term (current) use of anticoagulants: Secondary | ICD-10-CM

## 2017-07-06 DIAGNOSIS — M170 Bilateral primary osteoarthritis of knee: Secondary | ICD-10-CM

## 2017-07-06 DIAGNOSIS — D45 Polycythemia vera: Secondary | ICD-10-CM

## 2017-07-06 DIAGNOSIS — D72829 Elevated white blood cell count, unspecified: Secondary | ICD-10-CM | POA: Diagnosis not present

## 2017-07-06 NOTE — Patient Instructions (Addendum)
To lab today Continue CBC blood count every 2 months. Visit and lab in 6 months

## 2017-07-06 NOTE — Progress Notes (Signed)
Hematology and Oncology Follow Up Visit  [name] [id number] [birth year] 66 y.o. 07/06/2017 11:10 AM   Principle Diagnosis: Encounter Diagnoses  Name Primary?  . Polycythemia vera ([place]) Yes  . Chronic anticoagulation, new- eliquis   . Leukocytosis, unspecified type   . Other iron deficiency anemia   Clinical summary:   Interim History:   After years of trying to salvage her right lower extremity with multiple surgeries and angioplasties, unfortunately, she ultimately had to have a right above the knee amputation which was done on June 1 by Dr. Donnetta Hutching. She has adjusted to this remarkably well. She even made a joke out of it so that her grandkids were not intimidated and drew a happy face on the stump with a magic marker. She had some expected surgical blood losses with hemoglobin down to 7.8 and was given a blood transfusion. Most recent CBC on record is from June 11. Hemoglobin 10.6, MCV 74, white count 25,000 with 85% neutrophils, platelets 522,000. The lab today is pending. Some minor adjustments made in her medications. Hydralazine 25 mg twice daily added. Lisinopril increased to 20 mg twice daily.   Medications: reviewed  Allergies:  Allergies  Allergen Reactions  . Atenolol Rash and Itching  . Clopidogrel Itching  . Demerol [Meperidine] Other (See Comments) and Itching    Unknown, can't remember  Unknown, can't remember   . Inderal [Propranolol] Other (See Comments) and Itching    Doesn't remember   . Oxycodone-Acetaminophen Itching  . Prednisone Other (See Comments)    Muscle spasms  . Statins Itching and Other (See Comments)    Simvastatin-caused severe itching Pravastatin-caused lesser tiching One type of statin? Caused stroke in 2006, and other symptoms as result  . Venlafaxine Itching  . Warfarin And Related Other (See Comments)    Caused nose bleeds  . Crestor [Rosuvastatin] Itching and Rash  . Esomeprazole Rash  . Lactose Intolerance (Gi) Other (See  Comments)    Bloating, gas  . Lipitor [Atorvastatin] Rash  . Lovaza [Omega-3-Acid Ethyl Esters] Other (See Comments)    nosebleeds Doesn't remember   . Penicillins Hives, Other (See Comments) and Rash    Questionable high fever when taken and broke out in whelps.   . Pravastatin Itching and Rash  . Simvastatin Itching, Rash and Other (See Comments)    Also lipitor intolerant  . Trilipix [Choline Fenofibrate] Other (See Comments)    Doesn't remember   . Warfarin Other (See Comments)    nosebleeds  . Effexor [Venlafaxine Hcl] Other (See Comments)    Doesn't remember   . Nexium [Esomeprazole Magnesium] Rash  . Percocet [Oxycodone-Acetaminophen] Other (See Comments)    Doesn't remember   . Plavix [Clopidogrel Bisulfate] Rash    Review of Systems: See interim history Remaining ROS negative:   Physical Exam: Blood pressure (!) 102/51, pulse 84, temperature 98.2 F (36.8 C), temperature source Oral, height 5' 5.5" (1.664 m), weight 122 lb (55.3 kg), SpO2 98 %. Wt Readings from Last 3 Encounters:  07/06/17 122 lb (55.3 kg)  06/15/17 129 lb (58.5 kg)  05/26/17 129 lb 12.8 oz (58.9 kg)     General appearance: Petite Caucasian woman in good spirits HENNT: Pharynx no erythema, exudate, mass, or ulcer. No thyromegaly or thyroid nodules Lymph nodes: No cervical, supraclavicular, or axillary lymphadenopathy Breasts: Lungs: Clear to auscultation, resonant to percussion throughout Heart: Regular rhythm, 4-6/5 systolic murmur aortic area, no gallop, no rub, no click, no edema left lower extremity Abdomen: Soft, nontender,  normal bowel sounds, no mass, no organomegaly. I do not appreciate an enlarged spleen on today's exam. Extremities: No edema, no calf tenderness Musculoskeletal: There is now a right above the knee amputation GU:  Vascular: Carotid pulses 2+, no bruits, distal pulses: Dorsalis pedis 1+ symmetric Neurologic: Alert, oriented, PERRLA,  cranial nerves grossly normal, motor  strength 5 over 5, reflexes 1+ symmetric at the biceps, 1+ at the left knee., upper body coordination normal, gait not tested Skin: No rash. Chronic ecchymosis on the skin of her forearms.  Lab Results: CBC W/Diff    Component Value Date/Time   WBC 25.1 (H) 05/31/2017 0818   RBC 4.88 05/31/2017 0818   HGB 10.6 (L) 05/31/2017 0818   HGB 8.8 (L) 03/09/2017 1200   HCT 36.2 05/31/2017 0818   HCT 30.6 (L) 03/09/2017 1200   PLT 522 (H) 05/31/2017 0818   PLT 429 (H) 03/09/2017 1200   MCV 74.2 (L) 05/31/2017 0818   MCV 62 (L) 03/09/2017 1200   MCH 21.7 (L) 05/31/2017 0818   MCHC 29.3 (L) 05/31/2017 0818   RDW 22.3 (H) 05/31/2017 0818   RDW 19.9 (H) 03/09/2017 1200   LYMPHSABS 1.8 05/31/2017 0818   LYMPHSABS 1.1 03/09/2017 1200   MONOABS 0.5 05/31/2017 0818   EOSABS 1.3 (H) 05/31/2017 0818   EOSABS 0.7 (H) 03/09/2017 1200   BASOSABS 0.3 (H) 05/31/2017 0818   BASOSABS 0.2 03/09/2017 1200     Chemistry      Component Value Date/Time   NA 137 05/31/2017 0818   K 4.1 05/31/2017 0818   CL 101 05/31/2017 0818   CO2 25 05/31/2017 0818   BUN 18 05/31/2017 0818   CREATININE 0.86 05/31/2017 0818   CREATININE 1.33 (H) 03/03/2016 1531      Component Value Date/Time   CALCIUM 8.8 (L) 05/31/2017 0818   ALKPHOS 106 05/26/2017 0619   AST 19 05/26/2017 0619   ALT 26 05/26/2017 0619   BILITOT 0.5 05/26/2017 0347       Radiological Studies: No results found.  Impression:  #1. Polycythemia vera, JAK-2 positive, diagnosed July 2016 On anticoagulation and a phlebotomy program. Phlebotomy currently on hold due to surgical blood loss.I will continue to monitor her CBCs every 2 months. If hemoglobin goes above 13, resume phlebotomy.  I am going to start her on some oral iron ferrous sulfate 325 mg daily at this time.  Repeat blood count today is pending.  #2. Leukocytosis and splenomegaly These are both related to her myeloproliferative disorder.  #3. Chronic anticoagulation with  apixaban and aspirin due to paroxysmal age of fibrillation and extensive peripheral vascular disease also indicated for her myeloproliferative disorder.  #4. Coronary artery disease with history of a an STEMI May 2014  #5. Paroxysmal atrial fibrillation on treatment outlined above  #6. Cerebrovascular disease with bilateral carotid stenosis  #7. Peripheral vascular disease with claudication status post previous left external iliac stent and right SFA stent. Recent February 2017 right femoral-popliteal bypass.  In graft thrombosis February 2018 requiring emergency thrombectomy. Now status post right above-the-knee amputation 05/21/2017.  #8. History of TIA 2006  #9. Hyperlipidemia  #10. Glaucoma.  #11. GERD  #12. Degenerative arthritis of the knees  CC: Patient Care Team: Dione Housekeeper, MD as PCP - General (Family Medicine) Lorretta Harp, MD as Consulting Physician (Cardiology) Annia Belt, MD as Consulting Physician (Oncology) [name], MD as Consulting Physician (Vascular Surgery)   Murriel Hopper, MD, [place]  Hematology-Oncology/Internal Medicine  7/17/201811:10 AM

## 2017-07-07 LAB — CBC WITH DIFFERENTIAL/PLATELET
BASOS ABS: 0.3 10*3/uL — AB (ref 0.0–0.2)
Basos: 1 %
EOS (ABSOLUTE): 0.7 10*3/uL — ABNORMAL HIGH (ref 0.0–0.4)
Eos: 3 %
HEMOGLOBIN: 12.8 g/dL (ref 11.1–15.9)
Hematocrit: 44.7 % (ref 34.0–46.6)
IMMATURE GRANS (ABS): 0.4 10*3/uL — AB (ref 0.0–0.1)
IMMATURE GRANULOCYTES: 1 %
LYMPHS: 7 %
Lymphocytes Absolute: 1.6 10*3/uL (ref 0.7–3.1)
MCH: 21.4 pg — AB (ref 26.6–33.0)
MCHC: 28.6 g/dL — ABNORMAL LOW (ref 31.5–35.7)
MCV: 75 fL — ABNORMAL LOW (ref 79–97)
MONOCYTES: 2 %
Monocytes Absolute: 0.4 10*3/uL (ref 0.1–0.9)
NEUTROS ABS: 22 10*3/uL — AB (ref 1.4–7.0)
Neutrophils: 86 %
Platelets: 422 10*3/uL — ABNORMAL HIGH (ref 150–379)
RBC: 5.98 x10E6/uL — ABNORMAL HIGH (ref 3.77–5.28)
RDW: 20.7 % — ABNORMAL HIGH (ref 12.3–15.4)
WBC: 25.3 10*3/uL (ref 3.4–10.8)

## 2017-07-07 LAB — COMPREHENSIVE METABOLIC PANEL
ALBUMIN: 3.8 g/dL (ref 3.6–4.8)
ALT: 18 IU/L (ref 0–32)
AST: 22 IU/L (ref 0–40)
Albumin/Globulin Ratio: 1.7 (ref 1.2–2.2)
Alkaline Phosphatase: 71 IU/L (ref 39–117)
BUN / CREAT RATIO: 36 — AB (ref 12–28)
BUN: 30 mg/dL — AB (ref 8–27)
CALCIUM: 8.8 mg/dL (ref 8.7–10.3)
CO2: 18 mmol/L — AB (ref 20–29)
CREATININE: 0.83 mg/dL (ref 0.57–1.00)
Chloride: 104 mmol/L (ref 96–106)
GFR, EST AFRICAN AMERICAN: 86 mL/min/{1.73_m2} (ref >59)
GFR, EST NON AFRICAN AMERICAN: 74 mL/min/{1.73_m2} (ref >59)
GLUCOSE: 76 mg/dL (ref 65–99)
Globulin, Total: 2.3 g/dL (ref 1.5–4.5)
Potassium: 5.8 mmol/L — ABNORMAL HIGH (ref 3.5–5.2)
Sodium: 140 mmol/L (ref 134–144)
TOTAL PROTEIN: 6.1 g/dL (ref 6.0–8.5)

## 2017-07-07 LAB — LACTATE DEHYDROGENASE: LDH: 427 IU/L — ABNORMAL HIGH (ref 119–226)

## 2017-07-07 LAB — URIC ACID: Uric Acid: 9.9 mg/dL — ABNORMAL HIGH (ref 2.5–7.1)

## 2017-07-07 LAB — FERRITIN: FERRITIN: 46 ng/mL (ref 15–150)

## 2017-07-08 ENCOUNTER — Telehealth: Payer: Self-pay | Admitting: *Deleted

## 2017-07-08 NOTE — Telephone Encounter (Signed)
-----   Message from Annia Belt, MD sent at 07/07/2017  3:42 PM EDT ----- Call pt: blood counts good. White count up as usual. Hb now 12.8; continue Q 2 month CBCs; no phlebotomy for now.

## 2017-07-08 NOTE — Telephone Encounter (Signed)
Pt called / informed "blood counts good. White count up as usual. Hb now 12.8; continue Q 2 month CBCs; no phlebotomy for now." Stated "great" and she will call back to schedule next lab appt.

## 2017-08-02 ENCOUNTER — Encounter: Payer: Self-pay | Admitting: Vascular Surgery

## 2017-08-02 ENCOUNTER — Other Ambulatory Visit: Payer: Self-pay

## 2017-08-02 DIAGNOSIS — R600 Localized edema: Secondary | ICD-10-CM

## 2017-08-09 ENCOUNTER — Ambulatory Visit (HOSPITAL_COMMUNITY)
Admission: RE | Admit: 2017-08-09 | Discharge: 2017-08-09 | Disposition: A | Discharge status: Home / Self Care | Payer: Medicare Other | Source: Ambulatory Visit | Attending: Cardiology | Admitting: Cardiology

## 2017-08-09 ENCOUNTER — Encounter: Payer: Self-pay | Admitting: Vascular Surgery

## 2017-08-09 DIAGNOSIS — I739 Peripheral vascular disease, unspecified: Secondary | ICD-10-CM

## 2017-08-09 DIAGNOSIS — I779 Disorder of arteries and arterioles, unspecified: Secondary | ICD-10-CM | POA: Insufficient documentation

## 2017-08-09 DIAGNOSIS — I6523 Occlusion and stenosis of bilateral carotid arteries: Secondary | ICD-10-CM | POA: Diagnosis not present

## 2017-08-10 ENCOUNTER — Ambulatory Visit (INDEPENDENT_AMBULATORY_CARE_PROVIDER_SITE_OTHER): Payer: Self-pay | Admitting: Vascular Surgery

## 2017-08-10 ENCOUNTER — Encounter: Payer: Self-pay | Admitting: Vascular Surgery

## 2017-08-10 ENCOUNTER — Ambulatory Visit (HOSPITAL_COMMUNITY)
Admission: RE | Admit: 2017-08-10 | Discharge: 2017-08-10 | Disposition: A | Discharge status: Home / Self Care | Payer: Medicare Other | Source: Ambulatory Visit | Attending: Vascular Surgery | Admitting: Vascular Surgery

## 2017-08-10 VITALS — BP 143/72 | HR 69 | Temp 97.6°F | Ht 65.5 in | Wt 124.5 lb

## 2017-08-10 DIAGNOSIS — I739 Peripheral vascular disease, unspecified: Secondary | ICD-10-CM

## 2017-08-10 DIAGNOSIS — Z89611 Acquired absence of right leg above knee: Secondary | ICD-10-CM | POA: Insufficient documentation

## 2017-08-10 DIAGNOSIS — R600 Localized edema: Secondary | ICD-10-CM | POA: Diagnosis not present

## 2017-08-10 NOTE — Progress Notes (Signed)
Patient name: [name] MRN: [id number] DOB: 08-12-1951 Sex: female  REASON FOR VISIT: Evaluation of leg swelling.  HPI: [name] is a 66 y.o. female resents today concerning swelling in her left leg. She had the unreconstructable recurrent disease in her right leg and underwent amputation by myself above knee on 05/21/2017. She reports the swelling in her left foot is been present for several weeks. She actually reports that this is actually improved over the last week. He is here today for a duplex to rule out DVT. She is on chronic anticoagulation for her known coagulopathy  Current Outpatient Prescriptions  Medication Sig Dispense Refill  . acetaminophen (TYLENOL) 500 MG tablet Take 1,000 mg by mouth every 6 (six) hours as needed for moderate pain.     Marland Kitchen apixaban (ELIQUIS) 5 MG TABS tablet Take 1 tablet (5 mg total) by mouth 2 (two) times daily. 60 tablet 1  . ARTIFICIAL TEAR OP Place 1 drop into both eyes as needed (for dry eyes).    Marland Kitchen aspirin EC 325 MG tablet Take 325 mg by mouth daily.    . diclofenac sodium (VOLTAREN) 1 % GEL Apply 2 g topically 4 (four) times daily. 1 Tube 0  . diltiazem (CARDIZEM CD) 180 MG 24 hr capsule Take 1 capsule (180 mg total) by mouth every morning. 30 capsule 1  . docusate sodium (COLACE) 100 MG capsule Take 200 mg by mouth every evening.     . ezetimibe (ZETIA) 10 MG tablet Take 1 tablet (10 mg total) by mouth at bedtime. PLEASE CONTACT OFFICE FOR ADDITIONAL REFILLS 30 tablet 0  . fenofibrate (TRICOR) 145 MG tablet Take 1 tablet (145 mg total) by mouth at bedtime. PLEASE CONTACT OFFICE FOR ADDITIONAL REFILLS 7 tablet 0  . ferrous sulfate 325 (65 FE) MG tablet Take 325 mg by mouth every evening.    . flecainide (TAMBOCOR) 50 MG tablet Take 1 tablet (50 mg total) by mouth 2 (two) times daily. PLEASE CONTACT OFFICE FOR ADDITIONAL REFILLS 60 tablet 0  . gabapentin (NEURONTIN) 100 MG capsule Take 1 capsule (100 mg  total) by mouth 3 (three) times daily. 90 capsule 0  . lisinopril (PRINIVIL,ZESTRIL) 20 MG tablet Take 1 tablet (20 mg total) by mouth 2 (two) times daily. 60 tablet 0  . omeprazole (PRILOSEC) 20 MG capsule Take 1 capsule (20 mg total) by mouth 2 (two) times daily before a meal. 60 capsule 1  . traMADol (ULTRAM) 50 MG tablet Take 1 tablet (50 mg total) by mouth every 6 (six) hours as needed. Currently: taking med between 2-3 times daily for pain 60 tablet 0  . hydrALAZINE (APRESOLINE) 25 MG tablet Take 1 tablet (25 mg total) by mouth every 8 (eight) hours. (Patient not taking: Reported on 08/10/2017) 90 tablet 0   No current facility-administered medications for this visit.      PHYSICAL EXAM: Vitals:   08/10/17 1051  BP: (!) 143/72  Pulse: 69  Temp: 97.6 F (36.4 C)  TempSrc: Oral  Weight: 124 lb 8 oz (56.5 kg)  Height: 5' 5.5" (1.664 m)    GENERAL: The patient is a well-nourished female, in no acute distress. The vital signs are documented above. No significant swelling in her left foot. I do not palpate dorsalis pedis or posterior tibial pulse but she has a well-perfused foot with no evidence of arterial insufficiency  Left leg venous duplex shows no evidence of DVT or superficial thrombophlebitis  MEDICAL ISSUES: Stable overall. She  looks quite good today. She is in excellent spirits and reports she feels better than she has in years. She is looking forward to the final prosthesis fitting in the next several weeks. She will see [place] again on an as-needed basis   Rosetta Posner, MD [facility] Tel [phone] Pager [phone]

## 2017-08-11 ENCOUNTER — Other Ambulatory Visit: Payer: Self-pay | Admitting: Cardiovascular Disease

## 2017-08-11 DIAGNOSIS — I779 Disorder of arteries and arterioles, unspecified: Secondary | ICD-10-CM

## 2017-08-11 DIAGNOSIS — I739 Peripheral vascular disease, unspecified: Principal | ICD-10-CM

## 2017-08-12 ENCOUNTER — Encounter: Payer: Self-pay | Admitting: Vascular Surgery

## 2017-08-15 NOTE — Congregational Nurse Program (Signed)
Congregational Nurse Program Note  Date of Encounter: 08/15/2017  Past Medical History: Past Medical History:  Diagnosis Date  . Arthritis    "fingers, some in my knees" (01/28/2016)  . CAD (coronary artery disease), per cath 04/27/13, non obstructive disease, EF 65-70% 05/11/2013  . Carotid disease, bilateral ([place]) 09/2013   Lt carotid 50-69% stentosis, less on rt  . Dysrhythmia   . Fatty liver   . GERD (gastroesophageal reflux disease)   . Glaucoma   . H/O cardiovascular stress test 12/27/2009   negative for ischemia  . Headache    "occasional since eye pressure regulated" (01/28/2016)  . Hyperlipidemia   . Hypertension   . Leukocytosis 07/15/2015  . Migraine    "stopped w/laser holes to relieve pressure in my eyes; had them 3-4 times/wk before taht" (01/28/2016)  . Mitral regurgitation,2+ by cath 05/11/2013  . NSTEMI (non-ST elevated myocardial infarction) ([place]) 04/27/2013  . Pain    BOTH KNEES - PT HAS TORN MENISCUS LEFT KNEE  . Paroxysmal atrial fibrillation (HCC)    hx of a fib on ASA  AND ELIQUIS   . Polycythemia vera ([place]) 08/20/2015   JAK-2 positive 07/19/15  . PVD (peripheral vascular disease) with claudication ([place]) 07/2006   previous L ext iliac stent and rt SFA stent, known occluded Lt SFA  . S/P cardiac cath 04/27/13   NON OBSTRUCTIVE DISEASE, 2+ mr, ef 65-70%  . Statin intolerance   . Stroke ([place]) 2006   SWELLING ALL OVER AND RT SIDE OF FACE DRAWN AND SPEECH SLURRED AND NUMBNESS ON RIGHT SIDE-- ALL RESOLVED    Encounter Details:     CNP Questionnaire - 08/13/17 1230      Patient Demographics   Is this a new or existing patient? Existing   Patient is considered a/an Not Applicable   Race Caucasian/White     Patient Assistance   Location of Patient Assistance LOT [id number]   Patient's financial/insurance status Medicaid;Medicare   Uninsured Patient ([place] [facility]/[employer]s) No   Patient referred to apply for the following financial assistance Not Applicable   Food  insecurities addressed Provided food supplies   Transportation assistance No   Assistance securing medications No   Administrator;Exercise/physical activity  Rt. Leg Amputee     Encounter Details   Primary purpose of visit Safety;Education/Health Concerns   Was an Emergency Department visit averted? Not Applicable   Does patient have a medical provider? Yes   Patient referred to Follow up with established PCP   Was a mental health screening completed? (GAINS tool) No   Does patient have dental issues? No   Does patient have vision issues? No   Does your patient have an abnormal blood pressure today? Yes  B/P 173/76   Since previous encounter, have you referred patient for abnormal blood pressure that resulted in a new diagnosis or medication change? No   Does your patient have an abnormal blood glucose today? No   Since previous encounter, have you referred patient for abnormal blood glucose that resulted in a new diagnosis or medication change? No   Was there a life-saving intervention made? No    B/P 173/76 Pulse 74 (R) Leg amputee,waiting to be fitted for prosthesis   Theron Arista, RN [phone]     Clinical Intake - 08/10/17 1055      Pre-visit preparation   Pre-visit preparation completed Yes     Pain   Pain  No/denies pain     Functional Status  Ambulation Needs Assistance   Home Assistive Devices/Equipment Wheelchair   Home Management Independent  with granddaughter

## 2017-08-26 ENCOUNTER — Encounter: Payer: Medicare Other | Admitting: Physical Medicine & Rehabilitation

## 2017-08-31 ENCOUNTER — Other Ambulatory Visit (INDEPENDENT_AMBULATORY_CARE_PROVIDER_SITE_OTHER): Payer: Medicare Other

## 2017-08-31 ENCOUNTER — Telehealth: Payer: Self-pay | Admitting: *Deleted

## 2017-08-31 DIAGNOSIS — D45 Polycythemia vera: Secondary | ICD-10-CM

## 2017-08-31 NOTE — Telephone Encounter (Signed)
-----   Message from Annia Belt, MD sent at 08/31/2017  2:38 PM EDT ----- Let her know that a bruise on the arm is not the sign of a blood clot ----- Message ----- From: Ebbie Latus, RN Sent: 08/31/2017  11:37 AM To: Annia Belt, MD  Pt's here at the clinic for labs -  she has a egg size/shape bruise on her left arm since yesterday. States it doe not hurt, somewhat hard to touch - said she had one before on the right arm and it went away. But she concern about blood clots. She did not hit the arm. Told her I will let Dr Beryle Beams know. Also said she has an appt tomorrow to see the doctor about her stump and she will have him look at it. [place]

## 2017-08-31 NOTE — Telephone Encounter (Signed)
Called pt - informed of Dr Azucena Freed response. States "that's makes me feel better". Also stated she might had hit her arm w/o knowing. Informed to call if bruise increases in size , become painful - state she will.

## 2017-08-31 NOTE — Telephone Encounter (Addendum)
Pt's here at the clinic for labs -  she has a egg size/shape bruise on her left arm since yesterday. States it doe not hurt, somewhat hard to touch - said she had one before on the right arm and it went away. But she concern about blood clots. She did not hit the arm. Told her I will let Dr Beryle Beams know.Also said she has an appt tomorrow to see the doctor about her stump and she will have him look at it.

## 2017-09-01 ENCOUNTER — Encounter: Payer: Self-pay | Admitting: Physical Medicine & Rehabilitation

## 2017-09-01 ENCOUNTER — Encounter: Payer: Medicare Other | Attending: Physical Medicine & Rehabilitation | Admitting: Physical Medicine & Rehabilitation

## 2017-09-01 VITALS — BP 144/73 | HR 77 | Resp 14

## 2017-09-01 DIAGNOSIS — I480 Paroxysmal atrial fibrillation: Secondary | ICD-10-CM | POA: Insufficient documentation

## 2017-09-01 DIAGNOSIS — H409 Unspecified glaucoma: Secondary | ICD-10-CM | POA: Diagnosis not present

## 2017-09-01 DIAGNOSIS — G546 Phantom limb syndrome with pain: Secondary | ICD-10-CM | POA: Insufficient documentation

## 2017-09-01 DIAGNOSIS — Z9181 History of falling: Secondary | ICD-10-CM | POA: Insufficient documentation

## 2017-09-01 DIAGNOSIS — E785 Hyperlipidemia, unspecified: Secondary | ICD-10-CM | POA: Insufficient documentation

## 2017-09-01 DIAGNOSIS — I739 Peripheral vascular disease, unspecified: Secondary | ICD-10-CM | POA: Insufficient documentation

## 2017-09-01 DIAGNOSIS — R269 Unspecified abnormalities of gait and mobility: Secondary | ICD-10-CM | POA: Diagnosis not present

## 2017-09-01 DIAGNOSIS — Z89611 Acquired absence of right leg above knee: Secondary | ICD-10-CM | POA: Diagnosis not present

## 2017-09-01 DIAGNOSIS — D45 Polycythemia vera: Secondary | ICD-10-CM | POA: Diagnosis not present

## 2017-09-01 DIAGNOSIS — I252 Old myocardial infarction: Secondary | ICD-10-CM | POA: Insufficient documentation

## 2017-09-01 DIAGNOSIS — Z87891 Personal history of nicotine dependence: Secondary | ICD-10-CM | POA: Diagnosis not present

## 2017-09-01 DIAGNOSIS — Z8673 Personal history of transient ischemic attack (TIA), and cerebral infarction without residual deficits: Secondary | ICD-10-CM | POA: Insufficient documentation

## 2017-09-01 DIAGNOSIS — I2510 Atherosclerotic heart disease of native coronary artery without angina pectoris: Secondary | ICD-10-CM | POA: Insufficient documentation

## 2017-09-01 DIAGNOSIS — I10 Essential (primary) hypertension: Secondary | ICD-10-CM | POA: Insufficient documentation

## 2017-09-01 DIAGNOSIS — K219 Gastro-esophageal reflux disease without esophagitis: Secondary | ICD-10-CM | POA: Insufficient documentation

## 2017-09-01 LAB — CBC WITH DIFFERENTIAL/PLATELET
BASOS ABS: 0.3 10*3/uL — AB (ref 0.0–0.2)
Basos: 1 %
EOS (ABSOLUTE): 0.6 10*3/uL — ABNORMAL HIGH (ref 0.0–0.4)
EOS: 3 %
HEMOGLOBIN: 16.9 g/dL — AB (ref 11.1–15.9)
Hematocrit: 54.7 % — ABNORMAL HIGH (ref 34.0–46.6)
Immature Grans (Abs): 0.5 10*3/uL — ABNORMAL HIGH (ref 0.0–0.1)
Immature Granulocytes: 2 %
LYMPHS ABS: 1.5 10*3/uL (ref 0.7–3.1)
Lymphs: 6 %
MCH: 23.3 pg — AB (ref 26.6–33.0)
MCHC: 30.9 g/dL — AB (ref 31.5–35.7)
MCV: 75 fL — AB (ref 79–97)
MONOS ABS: 0.5 10*3/uL (ref 0.1–0.9)
Monocytes: 2 %
NEUTROS ABS: 19.4 10*3/uL — AB (ref 1.4–7.0)
NRBC: 1 % — ABNORMAL HIGH (ref 0–0)
Neutrophils: 86 %
Platelets: 326 10*3/uL (ref 150–379)
RBC: 7.25 x10E6/uL — AB (ref 3.77–5.28)
RDW: 20.4 % — ABNORMAL HIGH (ref 12.3–15.4)
WBC: 22.8 10*3/uL (ref 3.4–10.8)

## 2017-09-01 NOTE — Progress Notes (Signed)
Subjective:    Patient ID: [name], female    DOB: 1951-03-11, 66 y.o.   MRN: [id number]  HPI  66 year old right-handed female, history of polycythemia vera, diagnosed July 2016, CAD, hypertension, atrial fibrillation presents for follow up for right AKA.  Last clinic visit 06/25/17.  Since that time, she has completed HH therapies.  She expects to have her prosthesis at the end of the month.  Gabapentin is helping.  BP is relatively controlled.  Denies falls.   Pain Inventory Average Pain 0 Pain Right Now 0 My pain is stabbing, aching and phantom pain  In the last 24 hours, has pain interfered with the following? General activity 0 Relation with others 0 Enjoyment of life 0 What TIME of day is your pain at its worst? evening, night Sleep (in general) Good  Pain is worse with: sitting Pain improves with: rest, heat/ice and medication Relief from Meds: 8  Mobility walk with assistance use a walker ability to climb steps?  no do you drive?  no use a wheelchair transfers alone Do you have any goals in this area?  yes  Function disabled: date disabled . I need assistance with the following:  household duties  Neuro/Psych trouble walking  Prior Studies Any changes since last visit?  no  Physicians involved in your care Any changes since last visit?  no   Family History  Problem Relation Age of Onset  . CAD Mother   . Lung cancer Mother   . Heart attack Mother   . CAD Father   . Heart disease Father   . Heart attack Father 34       died in hes 12's with heart disease  . Healthy Sister   . Liver cancer Brother   . Healthy Sister   . CAD Brother        has had CABG  . CAD Brother    Social History   Social History  . Marital status: Widowed    Spouse name: N/A  . Number of children: 2  . Years of education: 12   Occupational History  . CNA    Social History Main Topics  . Smoking status: Former Smoker    Packs/day: 1.00    Years: 45.00   Types: Cigarettes    Quit date: 11/11/2012  . Smokeless tobacco: Never Used     Comment: quit 4 years ago  . Alcohol use No  . Drug use: No  . Sexual activity: Not Currently    Birth control/ protection: None   Other Topics Concern  . None   Social History Narrative  . None   Past Surgical History:  Procedure Laterality Date  . AMPUTATION Right 05/21/2017   Procedure: AMPUTATION ABOVE KNEE;  Surgeon: Rosetta Posner, MD;  Location: [place] [facility];  Service: Vascular;  Laterality: Right;  . CARDIAC CATHETERIZATION  04/27/13   non occlusive disease with mild to mod. calcified lesions in ostial LM and proximal LAD and moderate prox. RC AND DISTAL AV GROOVE LCX, ef  . CATARACT EXTRACTION W/PHACO Right 03/21/2015   Procedure: CATARACT EXTRACTION PHACO AND INTRAOCULAR LENS PLACEMENT RIGHT EYE;  Surgeon: Baruch Goldmann, MD;  Location: [facility];  Service: Ophthalmology;  Laterality: Right;  CDE:5.10  . CATARACT EXTRACTION W/PHACO Left 05/16/2015   Procedure: CATARACT EXTRACTION PHACO AND INTRAOCULAR LENS PLACEMENT (IOC);  Surgeon: Baruch Goldmann, MD;  Location: [facility];  Service: Ophthalmology;  Laterality: Left;  CDE 5.57  . [place]  .  COLONOSCOPY N/A 09/11/2016   Procedure: COLONOSCOPY;  Surgeon: Rogene Houston, MD;  [facility];  Service: Endoscopy;  Laterality: N/A;  730-moved to 1:00 Ann notified pt  . EMBOLECTOMY Right 02/01/2016   Procedure: Thrombectomy of Right Femoral-Popliteal Bypass Graft; Endarterectomy of Right Below Knee Popliteal Artery and Tibial Peroneal Trunk with Bovine Pericardium Patch Angioplasty.;  Surgeon: Angelia Mould, MD;  Location: [place];  Service: Vascular;  Laterality: Right;  . FEMORAL-POPLITEAL BYPASS GRAFT Right 01/31/2016   Procedure: BYPASS GRAFT RIGHT COMMON  FEMORALTO BELOW KNEE POPLITEAL ARTERY BYPASS GRAFT USING 6MM PROPATEN GORTEX GRAFT;  Surgeon: Mal Misty, MD;  Location: [place];  Service: Vascular;  Laterality: Right;  .  FEMORAL-POPLITEAL BYPASS GRAFT Right 01/09/2017   Procedure: THROMBECTOMY OF RIGHT FEMORAL-POPLITEAL  ARTERY BYPASS GRAFT; THROMBECTOMY RIGHT  TIBIAL VESSELS;  Surgeon: [name], MD;  Location: [place];  Service: Vascular;  Laterality: Right;  . FEMORAL-POPLITEAL BYPASS GRAFT Right 05/17/2017   Procedure: RIGHT FEMORAL-TIBIAL PERONEAL TRUNK ARTERY BYPASS GRAFT USING 56mmX80cm PROPATEN GRAFT WITH REMOVABLE RING;  Surgeon: Rosetta Posner, MD;  Location: [place];  Service: Vascular;  Laterality: Right;  . FRACTURE SURGERY    . ILIAC ARTERY STENT  07/2006   external iliac and Rt SFA stent 07/2006 AND THE LEFT WAS IN 2006  . KNEE ARTHROSCOPY WITH MEDIAL MENISECTOMY Left 03/27/2014   Procedure: left knee arthorscopy with medial chondraplasty of the medial femoral and patella, medial microfracture technique of medial femoral condyl;  Surgeon: Tobi Bastos, MD;  Location: [facility];  Service: Orthopedics;  Laterality: Left;  . LEFT HEART CATHETERIZATION WITH CORONARY ANGIOGRAM Right 04/27/2013   Procedure: LEFT HEART CATHETERIZATION WITH CORONARY ANGIOGRAM;  Surgeon: Leonie Man, MD;  Location[facility];  Service: Cardiovascular;  Laterality: Right;  . LOWER EXTREMITY ANGIOGRAM Right 01/31/2016   Procedure: INTRAOP RIGHT LOWER EXTREMITY ANGIOGRAM;  Surgeon: Mal Misty, MD;  Location: [place];  Service: Vascular;  Laterality: Right;  . ORIF WRIST FRACTURE Right 1983  . OVARIAN CYST REMOVAL Right   . PATCH ANGIOPLASTY Right 01/31/2016   Procedure: RIGHT COMMON FEMORAL AND PROFUNDA FEMORIS ENDARECTOMY WITH PATCH ANGIOPLASTY;  Surgeon: Mal Misty, MD;  Location: [place];  Service: Vascular;  Laterality: Right;  . PATCH ANGIOPLASTY Right 01/09/2017   Procedure: PATCH ANGIOPLASTY RIGHT POPLITEAL ARTERY BYPASS GRAFT;  Surgeon: [name], MD;  Location: [place];  Service: Vascular;  Laterality: Right;  . PERIPHERAL VASCULAR CATHETERIZATION N/A 06/27/2015   Procedure: Lower Extremity Angiography;  Surgeon:  Lorretta Harp, MD;  Location: [place] CV LAB;  Service: Cardiovascular;  Laterality: N/A;  . PERIPHERAL VASCULAR CATHETERIZATION Bilateral 01/29/2016   Procedure: Lower Extremity Angiography;  Surgeon: Wellington Hampshire, MD;  Location: [place] [facility];  Service: Cardiovascular;  Laterality: Bilateral;  . PERIPHERAL VASCULAR CATHETERIZATION N/A 01/29/2016   Procedure: Abdominal Aortogram;  Surgeon: Wellington Hampshire, MD;  Location: [place] CV LAB;  Service: Cardiovascular;  Laterality: N/A;  . REFRACTIVE SURGERY Bilateral    "6 in one eye; 7 in the other; to relieve pressure; not glaucoma" (01/28/2016)  . SFA Right 06/27/2015   overlapping [place] covered stents  . TUBAL LIGATION  1983  . VEIN HARVEST Left 05/17/2017   Procedure: LEFT LEG GREATER SAPHENOUS VEIN HARVEST;  Surgeon: Rosetta Posner, MD;  Location: [place];  Service: Vascular;  Laterality: Left;  Marland Kitchen VEIN REPAIR Right 01/31/2016   Procedure: RIGHT GREATER SAPHENOUS VEIN EXAMNED  BUT NOT REMOVED;  Surgeon: Mal Misty, MD;  Location: [place];  Service: Vascular;  Laterality: Right;   Past Medical History:  Diagnosis Date  . Arthritis    "fingers, some in my knees" (01/28/2016)  . CAD (coronary artery disease), per cath 04/27/13, non obstructive disease, EF 65-70% 05/11/2013  . Carotid disease, bilateral ([place]) 09/2013   Lt carotid 50-69% stentosis, less on rt  . Dysrhythmia   . Fatty liver   . GERD (gastroesophageal reflux disease)   . Glaucoma   . H/O cardiovascular stress test 12/27/2009   negative for ischemia  . Headache    "occasional since eye pressure regulated" (01/28/2016)  . Hyperlipidemia   . Hypertension   . Leukocytosis 07/15/2015  . Migraine    "stopped w/laser holes to relieve pressure in my eyes; had them 3-4 times/wk before taht" (01/28/2016)  . Mitral regurgitation,2+ by cath 05/11/2013  . NSTEMI (non-ST elevated myocardial infarction) ([place]) 04/27/2013  . Pain    BOTH KNEES - PT HAS TORN MENISCUS LEFT KNEE  . Paroxysmal  atrial fibrillation (HCC)    hx of a fib on ASA  AND ELIQUIS   . Polycythemia vera ([place]) 08/20/2015   JAK-2 positive 07/19/15  . PVD (peripheral vascular disease) with claudication ([place]) 07/2006   previous L ext iliac stent and rt SFA stent, known occluded Lt SFA  . S/P cardiac cath 04/27/13   NON OBSTRUCTIVE DISEASE, 2+ mr, ef 65-70%  . Statin intolerance   . Stroke ([place]) 2006   SWELLING ALL OVER AND RT SIDE OF FACE DRAWN AND SPEECH SLURRED AND NUMBNESS ON RIGHT SIDE-- ALL RESOLVED   BP (!) 144/73 (BP Location: Left Arm, Patient Position: Sitting, Cuff Size: Normal)   Pulse 77   Resp 14   SpO2 94%   Opioid Risk Score:   Fall Risk Score:  `1  Depression screen PHQ 2/9  Depression screen [facility] 2/9 07/06/2017 03/09/2017 10/13/2016 04/07/2016 10/29/2015 08/20/2015 07/15/2015  Decreased Interest 0 0 0 0 0 0 0  Down, Depressed, Hopeless 0 0 0 0 0 0 0  PHQ - 2 Score 0 0 0 0 0 0 0    Review of Systems  Constitutional: Negative.   HENT: Negative.   Respiratory: Negative.   Cardiovascular: Positive for leg swelling.  Gastrointestinal: Negative.   Endocrine: Negative.   Genitourinary: Negative.   Musculoskeletal: Positive for gait problem.  Skin: Negative.   Allergic/Immunologic: Negative.   Hematological: Negative.   Psychiatric/Behavioral: Negative.   All other systems reviewed and are negative.     Objective:   Physical Exam Constitutional: She appears well-developed. NAD. HENT: Normocephalic. Atraumatic Eyes: EOM are normal. No discharge.  Cardiovascular: RRR. No JVD. Respiratory: Effort normal and breath sounds normal.  GI: Soft. Bowel sounds are normal.  Musculoskeletal:  Right AKA Neurological: She is alert and oriented.  Motor: B/L UE 5/5 proximal to distal LLE: 5/5 proximal to distal RLE: HF 5/5 HF  Skin: Incision healed. Graft sites c/d/i.Marland Kitchen Psychiatric: She has a normal mood and affect. Her behavior is normal.     Assessment & Plan:  66 year old right-handed female,  history of polycythemia vera, diagnosed July 2016, CAD, hypertension, atrial fibrillation presents for  follow up for right AKA.  1. Right AKA 05/21/2017 as well as PVD with multiple revascularization procedures   Cont HEP, anticipate outpt therapies after prosthesis  Pt hopes to have prosthesis at end of the month.  2. Phantom limb Pain:   Cont Gabapentin 100 TID  PRN, good benefit  3. Gait abnormality with falls  Cont walker/wheelchair for safety  Cont HEP

## 2017-09-02 ENCOUNTER — Telehealth: Payer: Self-pay | Admitting: *Deleted

## 2017-09-02 NOTE — Telephone Encounter (Signed)
Pt called / informed"Red count up again. We need to resume phlebotomy. " per Dr Beryle Beams, Pt stated has to be next Thurs or Friday since she has to ride transportation services. Talked to [place], [facility] short stay - appt scheduled next Thursday 9/20 @ 1100 AM. Hulen Skains pt back - informed of appt.

## 2017-09-02 NOTE — Telephone Encounter (Signed)
-----   Message from Annia Belt, MD sent at 09/01/2017  5:40 PM EDT ----- Call pt:  Red count up again. We need to resume phlebotomy. Find convenient day next week - I can put in orders

## 2017-09-06 ENCOUNTER — Other Ambulatory Visit: Payer: Self-pay | Admitting: Oncology

## 2017-09-06 DIAGNOSIS — D45 Polycythemia vera: Secondary | ICD-10-CM

## 2017-09-08 ENCOUNTER — Other Ambulatory Visit (HOSPITAL_COMMUNITY): Payer: Self-pay | Admitting: *Deleted

## 2017-09-09 ENCOUNTER — Ambulatory Visit (HOSPITAL_COMMUNITY)
Admission: RE | Admit: 2017-09-09 | Discharge: 2017-09-09 | Disposition: A | Discharge status: Home / Self Care | Payer: Medicare Other | Source: Ambulatory Visit | Attending: Oncology | Admitting: Oncology

## 2017-09-09 DIAGNOSIS — D45 Polycythemia vera: Secondary | ICD-10-CM | POA: Diagnosis present

## 2017-09-09 NOTE — Progress Notes (Signed)
Phlebotomized 500cc using pt's left AC.  Pt had recent hgb done so no need for that per MD order.  Pt toelrated phlebotomy well

## 2017-09-22 ENCOUNTER — Encounter (HOSPITAL_COMMUNITY): Payer: Self-pay

## 2017-09-22 ENCOUNTER — Emergency Department (HOSPITAL_COMMUNITY)
Admission: EM | Admit: 2017-09-22 | Discharge: 2017-09-22 | Disposition: A | Discharge status: Home / Self Care | Payer: Medicare Other | Attending: Emergency Medicine | Admitting: Emergency Medicine

## 2017-09-22 ENCOUNTER — Emergency Department (HOSPITAL_COMMUNITY): Payer: Medicare Other

## 2017-09-22 DIAGNOSIS — Z8673 Personal history of transient ischemic attack (TIA), and cerebral infarction without residual deficits: Secondary | ICD-10-CM | POA: Diagnosis not present

## 2017-09-22 DIAGNOSIS — I2510 Atherosclerotic heart disease of native coronary artery without angina pectoris: Secondary | ICD-10-CM | POA: Insufficient documentation

## 2017-09-22 DIAGNOSIS — Z79899 Other long term (current) drug therapy: Secondary | ICD-10-CM | POA: Diagnosis not present

## 2017-09-22 DIAGNOSIS — Z7982 Long term (current) use of aspirin: Secondary | ICD-10-CM | POA: Diagnosis not present

## 2017-09-22 DIAGNOSIS — Z7901 Long term (current) use of anticoagulants: Secondary | ICD-10-CM | POA: Diagnosis not present

## 2017-09-22 DIAGNOSIS — I10 Essential (primary) hypertension: Secondary | ICD-10-CM | POA: Diagnosis not present

## 2017-09-22 DIAGNOSIS — Z880 Allergy status to penicillin: Secondary | ICD-10-CM | POA: Insufficient documentation

## 2017-09-22 DIAGNOSIS — G43011 Migraine without aura, intractable, with status migrainosus: Secondary | ICD-10-CM | POA: Insufficient documentation

## 2017-09-22 DIAGNOSIS — Z87891 Personal history of nicotine dependence: Secondary | ICD-10-CM | POA: Insufficient documentation

## 2017-09-22 DIAGNOSIS — I252 Old myocardial infarction: Secondary | ICD-10-CM | POA: Insufficient documentation

## 2017-09-22 DIAGNOSIS — R51 Headache: Secondary | ICD-10-CM | POA: Diagnosis present

## 2017-09-22 DIAGNOSIS — Z885 Allergy status to narcotic agent status: Secondary | ICD-10-CM | POA: Diagnosis not present

## 2017-09-22 DIAGNOSIS — R112 Nausea with vomiting, unspecified: Secondary | ICD-10-CM | POA: Diagnosis not present

## 2017-09-22 LAB — BASIC METABOLIC PANEL
Anion gap: 11 (ref 5–15)
BUN: 17 mg/dL (ref 6–20)
CO2: 26 mmol/L (ref 22–32)
CREATININE: 0.79 mg/dL (ref 0.44–1.00)
Calcium: 9.5 mg/dL (ref 8.9–10.3)
Chloride: 100 mmol/L — ABNORMAL LOW (ref 101–111)
GFR calc Af Amer: >60 mL/min (ref >60)
Glucose, Bld: 125 mg/dL — ABNORMAL HIGH (ref 65–99)
Potassium: 4.7 mmol/L (ref 3.5–5.1)
SODIUM: 137 mmol/L (ref 135–145)

## 2017-09-22 LAB — CBC WITH DIFFERENTIAL/PLATELET
BASOS PCT: 1 %
Basophils Absolute: 0.3 10*3/uL — ABNORMAL HIGH (ref 0.0–0.1)
EOS ABS: 0.3 10*3/uL (ref 0.0–0.7)
Eosinophils Relative: 1 %
HCT: 60 % — ABNORMAL HIGH (ref 36.0–46.0)
Hemoglobin: 18.4 g/dL — ABNORMAL HIGH (ref 12.0–15.0)
Lymphocytes Relative: 3 %
Lymphs Abs: 0.8 10*3/uL (ref 0.7–4.0)
MCH: 24.2 pg — AB (ref 26.0–34.0)
MCHC: 30.7 g/dL (ref 30.0–36.0)
MCV: 78.8 fL (ref 78.0–100.0)
Monocytes Absolute: 0.5 10*3/uL (ref 0.1–1.0)
Monocytes Relative: 2 %
NEUTROS ABS: 25.2 10*3/uL — AB (ref 1.7–7.7)
Neutrophils Relative %: 93 %
PLATELETS: 325 10*3/uL (ref 150–400)
RBC: 7.61 MIL/uL — ABNORMAL HIGH (ref 3.87–5.11)
RDW: 20.6 % — ABNORMAL HIGH (ref 11.5–15.5)
WBC: 27.1 10*3/uL — ABNORMAL HIGH (ref 4.0–10.5)

## 2017-09-22 IMAGING — CT CT HEAD W/O CM
3 series · 16 of 47 positions shown, 19 images · non-contrast
Comparison: [DATE], MRI report [DATE]

CLINICAL DATA: Headache

EXAM:
CT HEAD WITHOUT CONTRAST
TECHNIQUE: Contiguous axial images were obtained from the base of the skull
through the vertex without intravenous contrast.

[Series 2: head wo · axial · 0.40mm/px · z∈[+68,+193]mm · 10 of 31 slices shown, 13 images]
[im 3/31  brain]
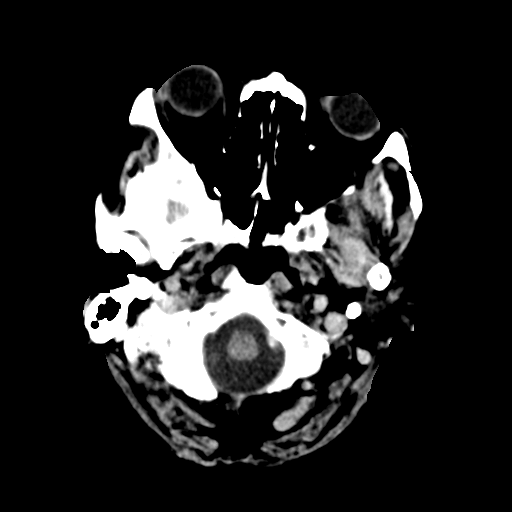
[im 3/31  bone]
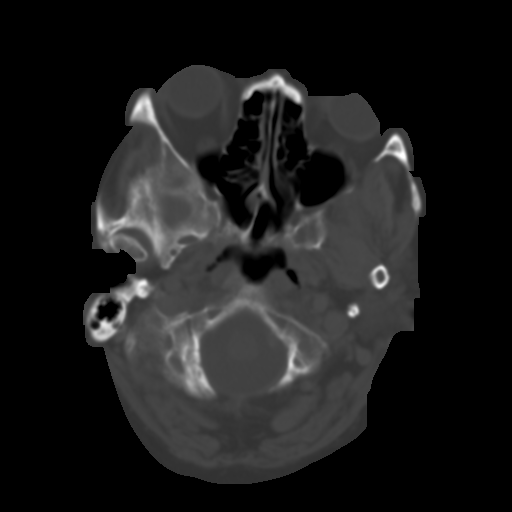
[im 6/31  brain]
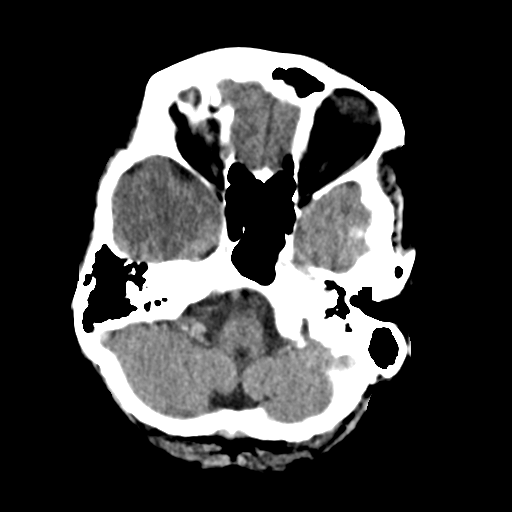
[im 9/31  brain]
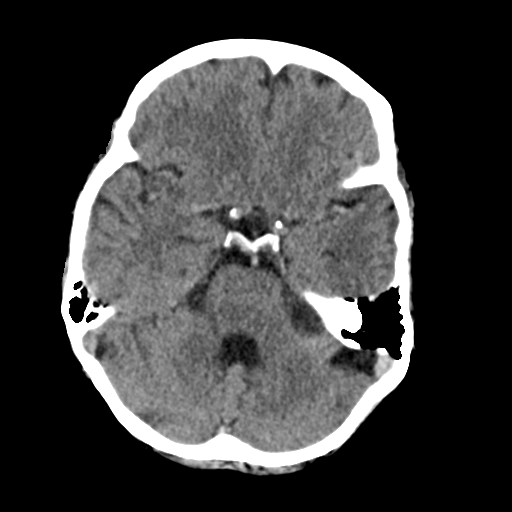
[im 11/31  brain]
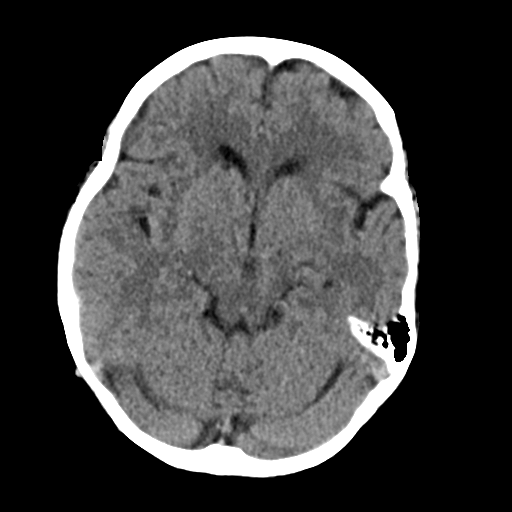
[im 14/31  brain]
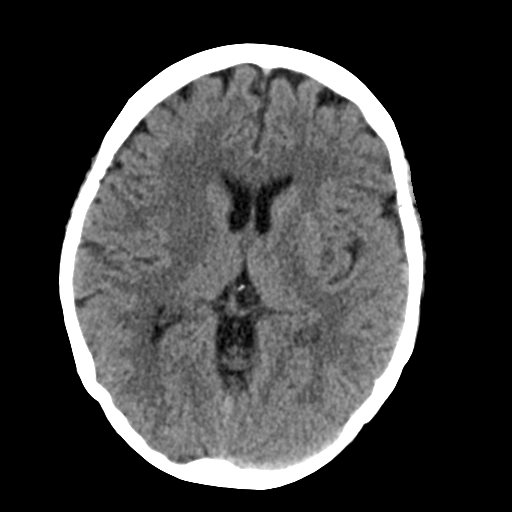
[im 14/31  bone]
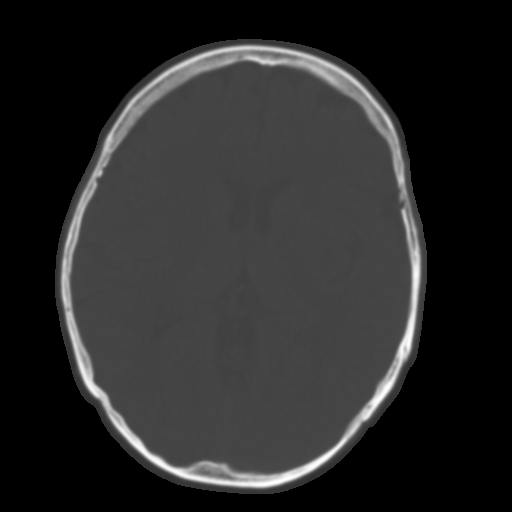
[im 17/31  brain]
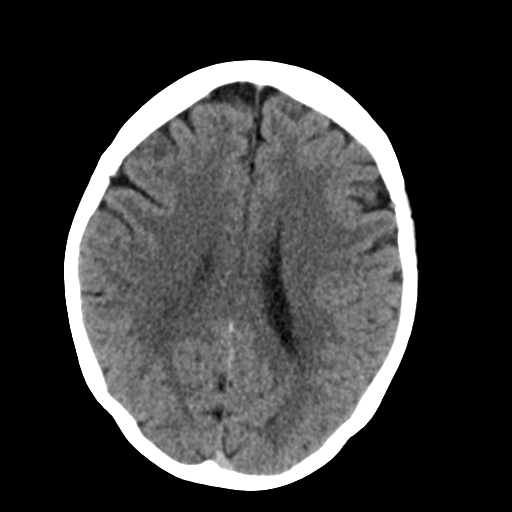
[im 20/31  brain]
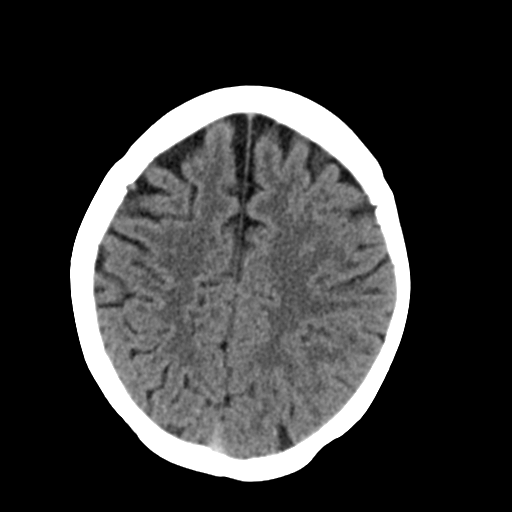
[im 23/31  brain]
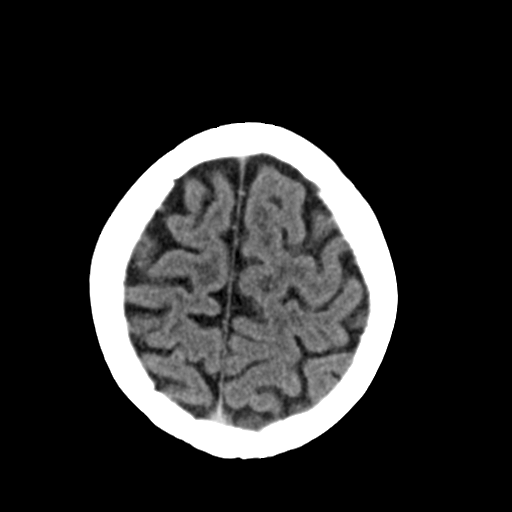
[im 25/31  brain]
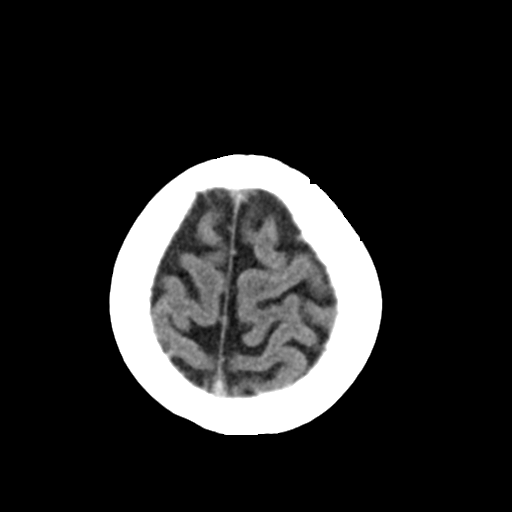
[im 25/31  bone]
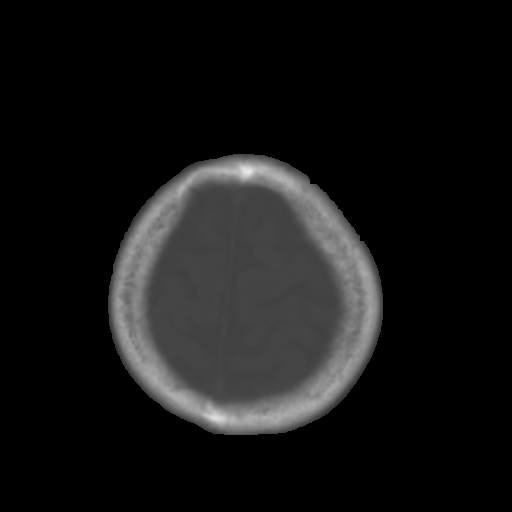
[im 28/31  brain]
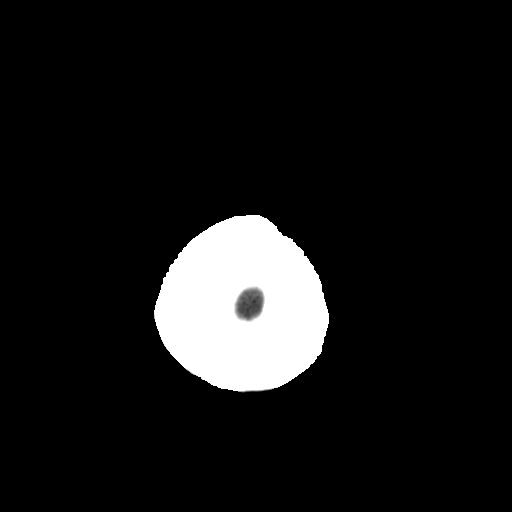

[Series 4: coronal soft tissue · coronal · 0.33mm/px · 3 of 67 slices shown]
[im 23/67  brain]
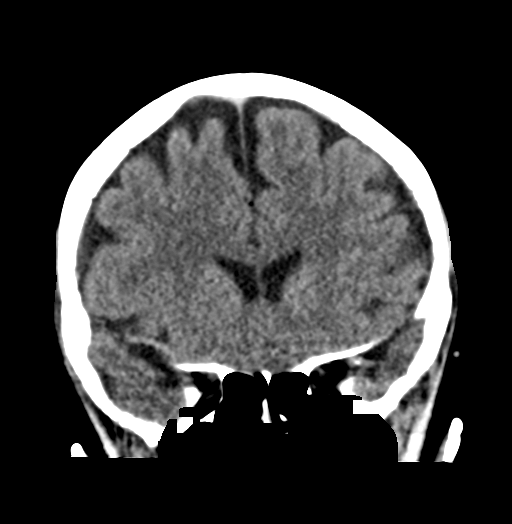
[im 30/67  brain]
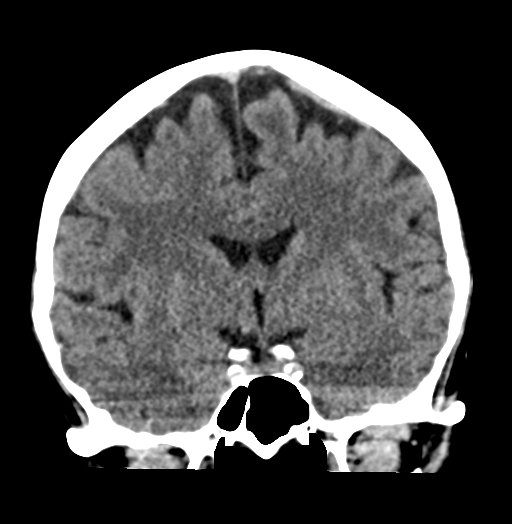
[im 37/67  brain]
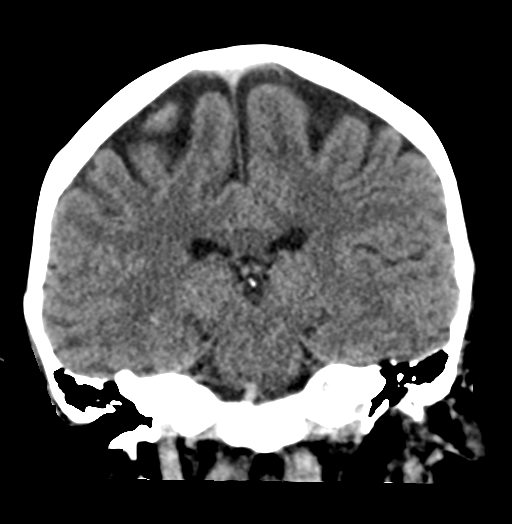

[Series 5: sagittal soft tissue · sagittal · 0.31mm/px · 3 of 67 slices shown]
[im 23/67  brain]
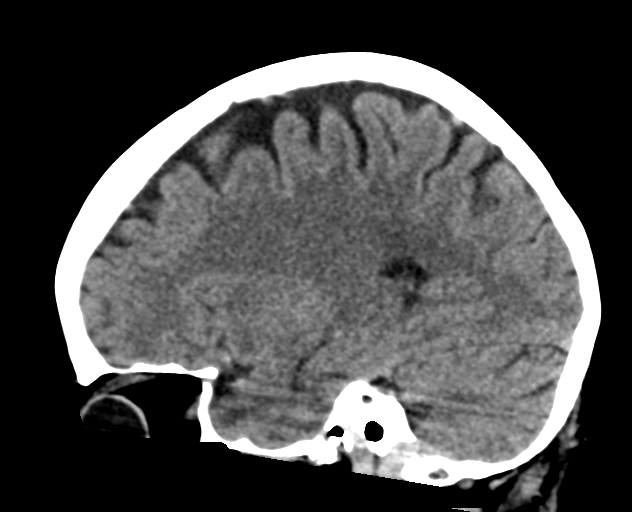
[im 34/67  brain]
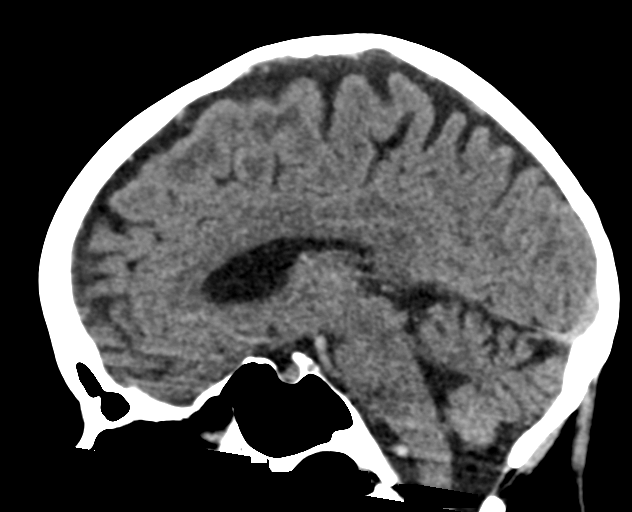
[im 45/67  brain]
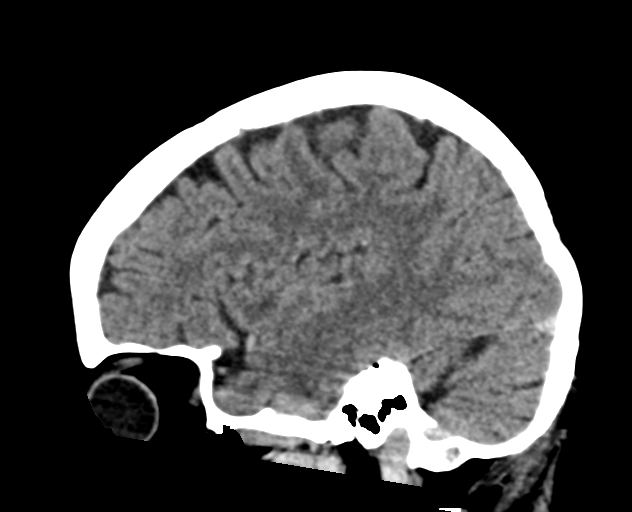

[16 of 47 positions shown; findings below may reference images not displayed]

FINDINGS: Brain: No acute territorial infarction, hemorrhage or intracranial
mass is visualized. Old lacunar infarct in the right caudate and
left sub insula. Nonenlarged ventricles.

Vascular: No hyperdense vessels. Scattered calcifications at the
carotid siphons

Skull: Normal. Negative for fracture or focal lesion.

Sinuses/Orbits: No acute finding.  Bilateral lens extraction

Other: None
IMPRESSION: No CT evidence for acute intracranial abnormality.

## 2017-09-22 MED ORDER — DEXAMETHASONE SODIUM PHOSPHATE 4 MG/ML IJ SOLN
10.0000 mg | Freq: Once | INTRAMUSCULAR | Status: AC
  Administered 2017-09-22: 10 mg via INTRAVENOUS
  Filled 2017-09-22: qty 3

## 2017-09-22 MED ORDER — SODIUM CHLORIDE 0.9 % IV BOLUS (SEPSIS)
1000.0000 mL | Freq: Once | INTRAVENOUS | Status: AC
Start: 2017-09-22 — End: 2017-09-22
  Administered 2017-09-22: 1000 mL via INTRAVENOUS

## 2017-09-22 MED ORDER — METOCLOPRAMIDE HCL 10 MG PO TABS
10.0000 mg | ORAL_TABLET | Freq: Once | ORAL | Status: AC
  Administered 2017-09-22: 10 mg via ORAL
  Filled 2017-09-22: qty 1

## 2017-09-22 MED ORDER — SODIUM CHLORIDE 0.9 % IV SOLN: INTRAVENOUS | Status: DC

## 2017-09-22 MED ORDER — DIPHENHYDRAMINE HCL 50 MG/ML IJ SOLN
25.0000 mg | Freq: Once | INTRAMUSCULAR | Status: AC
  Administered 2017-09-22: 25 mg via INTRAVENOUS
  Filled 2017-09-22: qty 1

## 2017-09-22 NOTE — Progress Notes (Signed)
Patients earrings put in ziplock bag and she placed them into her navy blue [place] bag

## 2017-09-22 NOTE — ED Triage Notes (Signed)
EMS reports pt c/o headache since yesterday.  Today bp elevated at 206/79.  Pt on bp medication and has been taking as directed.  Denies any recent change in medications.  Reports history of migraines but it has been several years since she has had a migraine.  Pt took tramadol around 1pm and 2 tylenol without relief.  Has vomited 4 or 5 times this afternoon.  EMS gave 4mg  zofran IV and pt reports it helped her nausea.

## 2017-09-22 NOTE — ED Notes (Signed)
ED Provider at bedside. 

## 2017-09-22 NOTE — ED Provider Notes (Signed)
[facility] DEPT Provider Note   CSN: [id number] Arrival date & time: 09/22/17  1750     History   Chief Complaint Chief Complaint  Patient presents with  . Headache  . Hypertension    HPI [name] is a 66 y.o. female.  Patient with history of migraines but none recently. Patient presents with complaint of bitemporal headache with blurred vision that started yesterday. Seem to be improving some and then got worse today at 1:30. Associated with some nausea and vomiting but no fever. No diarrhea. Some blurred vision, but no photophobia. Today's headache is similar to her migraines in the past. Patient's history is significant for polycythemia. Patient with a right above-the-knee amputation secondary to peripheral vascular disease.      Past Medical History:  Diagnosis Date  . Arthritis    "fingers, some in my knees" (01/28/2016)  . CAD (coronary artery disease), per cath 04/27/13, non obstructive disease, EF 65-70% 05/11/2013  . Carotid disease, bilateral ([place]) 09/2013   Lt carotid 50-69% stentosis, less on rt  . Dysrhythmia   . Fatty liver   . GERD (gastroesophageal reflux disease)   . Glaucoma   . H/O cardiovascular stress test 12/27/2009   negative for ischemia  . Headache    "occasional since eye pressure regulated" (01/28/2016)  . Hyperlipidemia   . Hypertension   . Leukocytosis 07/15/2015  . Migraine    "stopped w/laser holes to relieve pressure in my eyes; had them 3-4 times/wk before taht" (01/28/2016)  . Mitral regurgitation,2+ by cath 05/11/2013  . NSTEMI (non-ST elevated myocardial infarction) ([place]) 04/27/2013  . Pain    BOTH KNEES - PT HAS TORN MENISCUS LEFT KNEE  . Paroxysmal atrial fibrillation (HCC)    hx of a fib on ASA  AND ELIQUIS   . Polycythemia vera ([place]) 08/20/2015   JAK-2 positive 07/19/15  . PVD (peripheral vascular disease) with claudication ([place]) 07/2006   previous L ext iliac stent and rt SFA stent, known occluded Lt SFA  . S/P cardiac cath  04/27/13   NON OBSTRUCTIVE DISEASE, 2+ mr, ef 65-70%  . Statin intolerance   . Stroke ([place]) 2006   SWELLING ALL OVER AND RT SIDE OF FACE DRAWN AND SPEECH SLURRED AND NUMBNESS ON RIGHT SIDE-- ALL RESOLVED    Patient Active Problem List   Diagnosis Date Noted  . Phantom limb pain ([place])   . Hypertensive crisis   . Induration of skin   . Abnormality of gait   . Hypoalbuminemia due to protein-calorie malnutrition ([place])   . Amputee, above knee, right ([place]) 05/25/2017  . Unilateral AKA, right ([place])   . Elevated troponin   . PVD (peripheral vascular disease) ([place])   . Benign essential HTN   . Acute blood loss anemia   . Ischemia of right lower extremity 05/17/2017  . Thrombosis of right femoral artery ([place]) 01/09/2017  . Ischemia of extremity 01/09/2017  . History of colonic polyps 07/30/2016  . Essential hypertension 01/08/2016  . Polycythemia vera ([place]) 08/20/2015  . Leukocytosis 07/15/2015  . S/P arterial stent right SFA overlapping Bahn covered stents 06/27/2015 06/28/2015  . Claudication ([place]) 06/27/2015  . Carotid artery disease ([place]) 08/24/2014  . Osteoarthritis of left knee 03/27/2014  . External hemorrhoids 06/06/2013  . PAF (paroxysmal atrial fibrillation), maintaing SR 05/11/2013  . Chronic anticoagulation, new- eliquis 05/11/2013  . CAD (coronary artery disease), per cath 04/27/13, non obstructive disease 20-30% LM; LAD 30%; 50-60% OM2; RCA 40%; EF 65-70% 05/11/2013  .  Mitral regurgitation,2+ by cath 05/11/2013  . Atrial fibrillation with RVR, hx of PAF previously 04/27/2013  . PAD (peripheral artery disease), Lt carotid 50-70%: , Hx stent Lt exteral iliac & RSFA stent, known occl. LSFA  04/27/2013  . Hyperlipidemia 04/27/2013  . History of CVA (cerebrovascular accident) 04/27/2013  . NSTEMI (non-ST elevated myocardial infarction) ([place]) 04/27/2013    Past Surgical History:  Procedure Laterality Date  . AMPUTATION Right 05/21/2017   Procedure: AMPUTATION ABOVE KNEE;  Surgeon:  Rosetta Posner, MD;  Location: [facility];  Service: Vascular;  Laterality: Right;  . CARDIAC CATHETERIZATION  04/27/13   non occlusive disease with mild to mod. calcified lesions in ostial LM and proximal LAD and moderate prox. RC AND DISTAL AV GROOVE LCX, ef  . CATARACT EXTRACTION W/PHACO Right 03/21/2015   Procedure: CATARACT EXTRACTION PHACO AND INTRAOCULAR LENS PLACEMENT RIGHT EYE;  Surgeon: Baruch Goldmann, MD;  Location: [facility];  Service: Ophthalmology;  Laterality: Right;  CDE:5.10  . CATARACT EXTRACTION W/PHACO Left 05/16/2015   Procedure: CATARACT EXTRACTION PHACO AND INTRAOCULAR LENS PLACEMENT (IOC);  Surgeon: Baruch Goldmann, MD;  Location: [facility];  Service: Ophthalmology;  Laterality: Left;  CDE 5.57  . [place]  . COLONOSCOPY N/A 09/11/2016   Procedure: COLONOSCOPY;  Surgeon: Rogene Houston, MD;  [facility];  Service: Endoscopy;  Laterality: N/A;  730-moved to 1:00 Ann notified pt  . EMBOLECTOMY Right 02/01/2016   Procedure: Thrombectomy of Right Femoral-Popliteal Bypass Graft; Endarterectomy of Right Below Knee Popliteal Artery and Tibial Peroneal Trunk with Bovine Pericardium Patch Angioplasty.;  Surgeon: Angelia Mould, MD;  Location: [facility];  Service: Vascular;  Laterality: Right;  . FEMORAL-POPLITEAL BYPASS GRAFT Right 01/31/2016   Procedure: BYPASS GRAFT RIGHT COMMON  FEMORALTO BELOW KNEE POPLITEAL ARTERY BYPASS GRAFT USING 6MM PROPATEN GORTEX GRAFT;  Surgeon: Mal Misty, MD;  Location: [facility];  Service: Vascular;  Laterality: Right;  . FEMORAL-POPLITEAL BYPASS GRAFT Right 01/09/2017   Procedure: THROMBECTOMY OF RIGHT FEMORAL-POPLITEAL  ARTERY BYPASS GRAFT; THROMBECTOMY RIGHT  TIBIAL VESSELS;  Surgeon: [name], MD;  Location: [facility];  Service: Vascular;  Laterality: Right;  . FEMORAL-POPLITEAL BYPASS GRAFT Right 05/17/2017   Procedure: RIGHT FEMORAL-TIBIAL PERONEAL TRUNK ARTERY BYPASS GRAFT USING 77mmX80cm PROPATEN GRAFT WITH REMOVABLE RING;  Surgeon:  Rosetta Posner, MD;  Location: [facility];  Service: Vascular;  Laterality: Right;  . FRACTURE SURGERY    . ILIAC ARTERY STENT  07/2006   external iliac and Rt SFA stent 07/2006 AND THE LEFT WAS IN 2006  . KNEE ARTHROSCOPY WITH MEDIAL MENISECTOMY Left 03/27/2014   Procedure: left knee arthorscopy with medial chondraplasty of the medial femoral and patella, medial microfracture technique of medial femoral condyl;  Surgeon: Tobi Bastos, MD;  Location: [facility];  Service: Orthopedics;  Laterality: Left;  . LEFT HEART CATHETERIZATION WITH CORONARY ANGIOGRAM Right 04/27/2013   Procedure: LEFT HEART CATHETERIZATION WITH CORONARY ANGIOGRAM;  Surgeon: Leonie Man, MD;  Location: [facility] C[facility];  Service: Cardiovascular;  Laterality: Right;  . LOWER EXTREMITY ANGIOGRAM Right 01/31/2016   Procedure: INTRAOP RIGHT LOWER EXTREMITY ANGIOGRAM;  Surgeon: Mal Misty, MD;  Location: [facility];  Service: Vascular;  Laterality: Right;  . ORIF WRIST FRACTURE Right 1983  . OVARIAN CYST REMOVAL Right   . PATCH ANGIOPLASTY Right 01/31/2016   Procedure: RIGHT COMMON FEMORAL AND PROFUNDA FEMORIS ENDARECTOMY WITH PATCH ANGIOPLASTY;  Surgeon: Mal Misty, MD;  Location: [facility];  Service: Vascular;  Laterality: Right;  . PATCH ANGIOPLASTY Right 01/09/2017   Procedure: PATCH ANGIOPLASTY RIGHT POPLITEAL ARTERY BYPASS GRAFT;  Surgeon: [name], MD;  Location: [facility];  Service: Vascular;  Laterality: Right;  . PERIPHERAL VASCULAR CATHETERIZATION N/A 06/27/2015   Procedure: Lower Extremity Angiography;  Surgeon: Lorretta Harp, MD;  Location: [facility] CV LAB;  Service: Cardiovascular;  Laterality: N/A;  . PERIPHERAL VASCULAR CATHETERIZATION Bilateral 01/29/2016   Procedure: Lower Extremity Angiography;  Surgeon: Wellington Hampshire, MD;  Location: [facility] CV LAB;  Service: Cardiovascular;  Laterality: Bilateral;  . PERIPHERAL VASCULAR CATHETERIZATION N/A 01/29/2016   Procedure: Abdominal Aortogram;  Surgeon: Wellington Hampshire, MD;   Location: [facility] CV LAB;  Service: Cardiovascular;  Laterality: N/A;  . REFRACTIVE SURGERY Bilateral    "6 in one eye; 7 in the other; to relieve pressure; not glaucoma" (01/28/2016)  . SFA Right 06/27/2015   overlapping Bahn covered stents  . TUBAL LIGATION  1983  . VEIN HARVEST Left 05/17/2017   Procedure: LEFT LEG GREATER SAPHENOUS VEIN HARVEST;  Surgeon: Rosetta Posner, MD;  Location: [facility];  Service: Vascular;  Laterality: Left;  Mar[place] Kitchen VEIN REPAIR Right 01/31/2016   Procedure: RIGHT GREATER SAPHENOUS VEIN EXAMNED BUT NOT REMOVED;  Surgeon: Mal Misty, MD;  Location: [facility];  Service: Vascular;  Laterality: Right;    OB History    No data available       Home Medications    Prior to Admission medications   Medication Sig Start Date End Date Taking? Authorizing Provider  acetaminophen (TYLENOL) 500 MG tablet Take 1,000 mg by mouth every 6 (six) hours as needed for moderate pain.    Yes [provider]  apixaban (ELIQUIS) 5 MG TABS tablet Take 1 tablet (5 mg total) by mouth 2 (two) times daily. 05/31/17  Yes Angiulli, Lavon Paganini, PA-C  ARTIFICIAL TEAR OP Place 1 drop into both eyes as needed (for dry eyes).   Yes [provider]  aspirin EC 325 MG tablet Take 325 mg by mouth daily.   Yes [provider]  diclofenac sodium (VOLTAREN) 1 % GEL Apply 2 g topically 4 (four) times daily. Patient taking differently: Apply 2 g topically 4 (four) times daily as needed.  05/31/17  Yes Angiulli, Lavon Paganini, PA-C  diltiazem (CARDIZEM CD) 180 MG 24 hr capsule Take 1 capsule (180 mg total) by mouth every morning. 05/31/17  Yes Angiulli, Lavon Paganini, PA-C  docusate sodium (COLACE) 100 MG capsule Take 200 mg by mouth every evening.    Yes [provider]  ezetimibe (ZETIA) 10 MG tablet Take 1 tablet (10 mg total) by mouth at bedtime. PLEASE CONTACT OFFICE FOR ADDITIONAL REFILLS 05/31/17  Yes Angiulli, Lavon Paganini, PA-C  fenofibrate (TRICOR) 145 MG tablet Take 1 tablet (145 mg  total) by mouth at bedtime. PLEASE CONTACT OFFICE FOR ADDITIONAL REFILLS 03/08/17  Yes Lorretta Harp, MD  ferrous sulfate 325 (65 FE) MG tablet Take 325 mg by mouth every evening.   Yes [provider]  flecainide (TAMBOCOR) 50 MG tablet Take 1 tablet (50 mg total) by mouth 2 (two) times daily. PLEASE CONTACT OFFICE FOR ADDITIONAL REFILLS 05/31/17  Yes Angiulli, Lavon Paganini, PA-C  gabapentin (NEURONTIN) 100 MG capsule Take 1 capsule (100 mg total) by mouth 3 (three) times daily. 05/31/17  Yes Angiulli, Lavon Paganini, PA-C  hydrALAZINE (APRESOLINE) 25 MG tablet Take 25 mg by mouth 2 (two) times daily.   Yes [provider]  lisinopril (PRINIVIL,ZESTRIL) 20 MG tablet Take 1 tablet (20 mg total) by mouth 2 (two) times daily. 05/31/17  Yes Angiulli, Lavon Paganini, PA-C  omeprazole (PRILOSEC) 20 MG capsule Take 1 capsule (20 mg total) by mouth 2 (two) times daily before a meal. 05/31/17  Yes Angiulli, Lavon Paganini, PA-C  traMADol (ULTRAM) 50 MG tablet Take 1 tablet (50 mg total) by mouth every 6 (six) hours as needed. Currently: taking med between 2-3 times daily for pain 05/31/17  Yes Angiulli, Lavon Paganini, PA-C    Family History Family History  Problem Relation Age of Onset  . CAD Mother   . Lung cancer Mother   . Heart attack Mother   . CAD Father   . Heart disease Father   . Heart attack Father 69       died in hes 79's with heart disease  . Healthy Sister   . Liver cancer Brother   . Healthy Sister   . CAD Brother        has had CABG  . CAD Brother     Social History Social History  Substance Use Topics  . Smoking status: Former Smoker    Packs/day: 1.00    Years: 45.00    Types: Cigarettes    Quit date: 11/11/2012  . Smokeless tobacco: Never Used     Comment: quit 4 years ago  . Alcohol use No     Allergies   Atenolol; Demerol [meperidine]; Inderal [propranolol]; Prednisone; Statins; Warfarin and related; Crestor [rosuvastatin]; Lactose intolerance (gi); Lipitor  [atorvastatin]; Lovaza [omega-3-acid ethyl esters]; Penicillins; Pravastatin; Simvastatin; Trilipix [choline fenofibrate]; Warfarin; Hydralazine; Effexor [venlafaxine hcl]; Nexium [esomeprazole magnesium]; Percocet [oxycodone-acetaminophen]; and Plavix [clopidogrel bisulfate]   Review of Systems Review of Systems  Constitutional: Negative for fever.  HENT: Negative for congestion.   Eyes: Positive for visual disturbance. Negative for photophobia.  Respiratory: Negative for shortness of breath.   Cardiovascular: Negative for chest pain.  Gastrointestinal: Positive for nausea and vomiting. Negative for abdominal pain.  Genitourinary: Negative for dysuria.  Musculoskeletal: Negative for neck pain.  Skin: Negative for rash.  Neurological: Positive for headaches.  Hematological: Does not bruise/bleed easily.  Psychiatric/Behavioral: Negative for confusion.     Physical Exam Updated Vital Signs BP (!) 144/62   Pulse 78   Temp 97.9 F (36.6 C)   Resp 16   Ht 1.651 m (5\' 5" )   Wt 58.5 kg (129 lb)   SpO2 92%   BMI 21.47 kg/m   Physical Exam  Constitutional: She is oriented to person, place, and time. She appears well-developed and well-nourished. No distress.  HENT:  Head: Normocephalic and atraumatic.  Mouth/Throat: Oropharynx is clear and moist.  Eyes: Pupils are equal, round, and reactive to light. Conjunctivae and EOM are normal.  Neck: Normal range of motion. Neck supple.  Cardiovascular: Normal rate and regular rhythm.   Pulmonary/Chest: Effort normal and breath sounds normal. No respiratory distress.  Abdominal: Soft. Bowel sounds are normal. There is no tenderness.  Musculoskeletal: Normal range of motion.  A right above-the-knee amputation  Neurological: She is alert and oriented to person, place, and time. No cranial nerve deficit or sensory deficit. She exhibits normal muscle tone. Coordination normal.  Nursing note and vitals reviewed.    ED Treatments / Results    Labs (all labs ordered are listed, but only abnormal results are displayed) Labs Reviewed  CBC WITH DIFFERENTIAL/PLATELET - Abnormal; Notable for the following:       Result Value  WBC 27.1 (*)    RBC 7.61 (*)    Hemoglobin 18.4 (*)    HCT 60.0 (*)    MCH 24.2 (*)    RDW 20.6 (*)    Neutro Abs 25.2 (*)    Basophils Absolute 0.3 (*)    All other components within normal limits  BASIC METABOLIC PANEL - Abnormal; Notable for the following:    Chloride 100 (*)    Glucose, Bld 125 (*)    All other components within normal limits    EKG  EKG Interpretation None       Radiology Ct Head Wo Contrast  Result Date: 09/22/2017 CLINICAL DATA:  Headache EXAM: CT HEAD WITHOUT CONTRAST TECHNIQUE: Contiguous axial images were obtained from the base of the skull through the vertex without intravenous contrast. COMPARISON:  09/06/2005, MRI report 03/09/2014 FINDINGS: Brain: No acute territorial infarction, hemorrhage or intracranial mass is visualized. Old lacunar infarct in the right caudate and left sub insula. Nonenlarged ventricles. Vascular: No hyperdense vessels. Scattered calcifications at the carotid siphons Skull: Normal. Negative for fracture or focal lesion. Sinuses/Orbits: No acute finding.  Bilateral lens extraction Other: None IMPRESSION: No CT evidence for acute intracranial abnormality. Electronically Signed   By: Donavan Foil M.D.   On: 09/22/2017 20:06    Procedures Procedures (including critical care time)  Medications Ordered in ED Medications  0.9 %  sodium chloride infusion (not administered)  sodium chloride 0.9 % bolus 1,000 mL (1,000 mLs Intravenous New Bag/Given 09/22/17 1838)  dexamethasone (DECADRON) injection 10 mg (10 mg Intravenous Given 09/22/17 1837)  diphenhydrAMINE (BENADRYL) injection 25 mg (25 mg Intravenous Given 09/22/17 1837)  metoCLOPramide (REGLAN) tablet 10 mg (10 mg Oral Given 09/22/17 1838)     Initial Impression / Assessment and Plan / ED  Course  I have reviewed the triage vital signs and the nursing notes.  Pertinent labs & imaging results that were available during my care of the patient were reviewed by me and considered in my medical decision making (see chart for details).    CT head without any acute findings. Patient treated here with migraine cocktail to include Decadron Benadryl and Reglan and fluids patient the headache has improved slowly and gradually. Not resolve jet. Patient stable for discharge home. Labs without any significant changes.   Final Clinical Impressions(s) / ED Diagnoses   Final diagnoses:  Intractable migraine without aura and with status migrainosus    New Prescriptions New Prescriptions   No medications on file     Fredia Sorrow, MD 09/22/17 2117

## 2017-09-22 NOTE — Discharge Instructions (Signed)
The home and rest in dark room. If headache is not resolved and follow-up will be required either here with your doctors. MRI may be required if headache does not go away. Your head CT today without any acute findings. Labs without any significant changes.

## 2017-09-23 ENCOUNTER — Telehealth: Payer: Self-pay | Admitting: *Deleted

## 2017-09-23 ENCOUNTER — Telehealth: Payer: Self-pay

## 2017-09-23 DIAGNOSIS — R269 Unspecified abnormalities of gait and mobility: Secondary | ICD-10-CM

## 2017-09-23 DIAGNOSIS — Z89611 Acquired absence of right leg above knee: Secondary | ICD-10-CM

## 2017-09-23 NOTE — Telephone Encounter (Signed)
Patient of Dr.Patel needs prescription for physical therapy.

## 2017-09-23 NOTE — Telephone Encounter (Signed)
Referral placed per Dr Posey Pronto VO for gait training

## 2017-10-07 ENCOUNTER — Ambulatory Visit (HOSPITAL_COMMUNITY)
Admission: RE | Admit: 2017-10-07 | Discharge: 2017-10-07 | Disposition: A | Discharge status: Home / Self Care | Payer: Medicare Other | Source: Ambulatory Visit | Attending: Oncology | Admitting: Oncology

## 2017-10-07 DIAGNOSIS — D45 Polycythemia vera: Secondary | ICD-10-CM | POA: Insufficient documentation

## 2017-10-07 LAB — POCT HEMOGLOBIN-HEMACUE: Hemoglobin: 17.2 g/dL — ABNORMAL HIGH (ref 12.0–15.0)

## 2017-10-07 NOTE — Progress Notes (Signed)
1 unit phlebotomy (500 gm) removed from L AC without complication. Tolerated well and denies lightheadedness or dizziness. Discharged per self via her own wheel chair.

## 2017-10-26 ENCOUNTER — Ambulatory Visit: Payer: Medicare Other | Admitting: Physical Therapy

## 2017-11-01 ENCOUNTER — Encounter: Payer: Medicare Other | Admitting: Physical Therapy

## 2017-11-02 ENCOUNTER — Other Ambulatory Visit: Payer: Medicare Other

## 2017-11-03 ENCOUNTER — Encounter: Payer: Self-pay | Admitting: Physical Medicine & Rehabilitation

## 2017-11-03 ENCOUNTER — Other Ambulatory Visit: Payer: Self-pay

## 2017-11-03 ENCOUNTER — Encounter: Payer: Medicare Other | Attending: Physical Medicine & Rehabilitation | Admitting: Physical Medicine & Rehabilitation

## 2017-11-03 VITALS — BP 130/72 | HR 69

## 2017-11-03 DIAGNOSIS — Z87891 Personal history of nicotine dependence: Secondary | ICD-10-CM | POA: Insufficient documentation

## 2017-11-03 DIAGNOSIS — D45 Polycythemia vera: Secondary | ICD-10-CM | POA: Diagnosis not present

## 2017-11-03 DIAGNOSIS — I2510 Atherosclerotic heart disease of native coronary artery without angina pectoris: Secondary | ICD-10-CM | POA: Insufficient documentation

## 2017-11-03 DIAGNOSIS — E785 Hyperlipidemia, unspecified: Secondary | ICD-10-CM | POA: Insufficient documentation

## 2017-11-03 DIAGNOSIS — Z89611 Acquired absence of right leg above knee: Secondary | ICD-10-CM | POA: Diagnosis not present

## 2017-11-03 DIAGNOSIS — R269 Unspecified abnormalities of gait and mobility: Secondary | ICD-10-CM | POA: Diagnosis not present

## 2017-11-03 DIAGNOSIS — I739 Peripheral vascular disease, unspecified: Secondary | ICD-10-CM | POA: Insufficient documentation

## 2017-11-03 DIAGNOSIS — K219 Gastro-esophageal reflux disease without esophagitis: Secondary | ICD-10-CM | POA: Diagnosis not present

## 2017-11-03 DIAGNOSIS — Z9181 History of falling: Secondary | ICD-10-CM | POA: Insufficient documentation

## 2017-11-03 DIAGNOSIS — Z8673 Personal history of transient ischemic attack (TIA), and cerebral infarction without residual deficits: Secondary | ICD-10-CM | POA: Insufficient documentation

## 2017-11-03 DIAGNOSIS — I480 Paroxysmal atrial fibrillation: Secondary | ICD-10-CM | POA: Insufficient documentation

## 2017-11-03 DIAGNOSIS — I252 Old myocardial infarction: Secondary | ICD-10-CM | POA: Diagnosis not present

## 2017-11-03 DIAGNOSIS — G546 Phantom limb syndrome with pain: Secondary | ICD-10-CM | POA: Insufficient documentation

## 2017-11-03 DIAGNOSIS — H409 Unspecified glaucoma: Secondary | ICD-10-CM | POA: Diagnosis not present

## 2017-11-03 DIAGNOSIS — I10 Essential (primary) hypertension: Secondary | ICD-10-CM | POA: Insufficient documentation

## 2017-11-03 NOTE — Progress Notes (Signed)
Subjective:    Patient ID: [name], female    DOB: 09/02/1951, 66 y.o.   MRN: [id number]  HPI  66 year old right-handed female, history of polycythemia vera, diagnosed July 2016, CAD, hypertension, atrial fibrillation presents for follow up for right AKA.  Last clinic visit 09/01/17.  Since that time, she went to the ED for headaches, notes reviewed. She states obtained her prosthesis and is going to go to therapy soon. She is doing well with Gabapentin.   Pain Inventory Average Pain 0 Pain Right Now 0 My pain is no pain  In the last 24 hours, has pain interfered with the following? General activity 0 Relation with others 0 Enjoyment of life 0 What TIME of day is your pain at its worst? no pain, night Sleep (in general) Good  Pain is worse with: no pain Pain improves with: no pain Relief from Meds: 0  Mobility walk with assistance use a walker ability to climb steps?  no do you drive?  no use a wheelchair transfers alone Do you have any goals in this area?  no  Function disabled: date disabled . I need assistance with the following:  . Do you have any goals in this area?  no  Neuro/Psych No problems in this area  Prior Studies Any changes since last visit?  no  Physicians involved in your care Any changes since last visit?  no   Family History  Problem Relation Age of Onset  . CAD Mother   . Lung cancer Mother   . Heart attack Mother   . CAD Father   . Heart disease Father   . Heart attack Father 1       died in hes 74's with heart disease  . Healthy Sister   . Liver cancer Brother   . Healthy Sister   . CAD Brother        has had CABG  . CAD Brother    Social History   Socioeconomic History  . Marital status: Widowed    Spouse name: None  . Number of children: 2  . Years of education: 37  . Highest education level: None  Social Needs  . Financial resource strain: None  . Food insecurity - worry: None  . Food insecurity - inability:  None  . Transportation needs - medical: None  . Transportation needs - non-medical: None  Occupational History  . Occupation: CNA  Tobacco Use  . Smoking status: Former Smoker    Packs/day: 1.00    Years: 45.00    Pack years: 45.00    Types: Cigarettes    Last attempt to quit: 11/11/2012    Years since quitting: 4.9  . Smokeless tobacco: Never Used  . Tobacco comment: quit 4 years ago  Substance and Sexual Activity  . Alcohol use: No    Alcohol/week: 0.0 oz  . Drug use: No  . Sexual activity: Not Currently    Birth control/protection: None  Other Topics Concern  . None  Social History Narrative  . None   Past Surgical History:  Procedure Laterality Date  . CARDIAC CATHETERIZATION  04/27/13   non occlusive disease with mild to mod. calcified lesions in ostial LM and proximal LAD and moderate prox. RC AND DISTAL AV GROOVE LCX, ef  . CESAREAN SECTION  1977  . FRACTURE SURGERY    . ILIAC ARTERY STENT  07/2006   external iliac and Rt SFA stent 07/2006 AND THE LEFT WAS IN 2006  .  ORIF WRIST FRACTURE Right 1983  . OVARIAN CYST REMOVAL Right   . REFRACTIVE SURGERY Bilateral    "6 in one eye; 7 in the other; to relieve pressure; not glaucoma" (01/28/2016)  . SFA Right 06/27/2015   overlapping Bahn covered stents  . TUBAL LIGATION  1983   Past Medical History:  Diagnosis Date  . Arthritis    "fingers, some in my knees" (01/28/2016)  . CAD (coronary artery disease), per cath 04/27/13, non obstructive disease, EF 65-70% 05/11/2013  . Carotid disease, bilateral ([place]) 09/2013   Lt carotid 50-69% stentosis, less on rt  . Dysrhythmia   . Fatty liver   . GERD (gastroesophageal reflux disease)   . Glaucoma   . H/O cardiovascular stress test 12/27/2009   negative for ischemia  . Headache    "occasional since eye pressure regulated" (01/28/2016)  . Hyperlipidemia   . Hypertension   . Leukocytosis 07/15/2015  . Migraine    "stopped w/laser holes to relieve pressure in my eyes; had them 3-4  times/wk before taht" (01/28/2016)  . Mitral regurgitation,2+ by cath 05/11/2013  . NSTEMI (non-ST elevated myocardial infarction) ([place]) 04/27/2013  . Pain    BOTH KNEES - PT HAS TORN MENISCUS LEFT KNEE  . Paroxysmal atrial fibrillation (HCC)    hx of a fib on ASA  AND ELIQUIS   . Polycythemia vera ([place]) 08/20/2015   JAK-2 positive 07/19/15  . PVD (peripheral vascular disease) with claudication ([place]) 07/2006   previous L ext iliac stent and rt SFA stent, known occluded Lt SFA  . S/P cardiac cath 04/27/13   NON OBSTRUCTIVE DISEASE, 2+ mr, ef 65-70%  . Statin intolerance   . Stroke ([place]) 2006   SWELLING ALL OVER AND RT SIDE OF FACE DRAWN AND SPEECH SLURRED AND NUMBNESS ON RIGHT SIDE-- ALL RESOLVED   BP 130/72   Pulse 69   SpO2 94%   Opioid Risk Score:   Fall Risk Score:  `1  Depression screen PHQ 2/9  Depression screen [facility] 2/9 11/03/2017 07/06/2017 03/09/2017 10/13/2016 04/07/2016 10/29/2015 08/20/2015  Decreased Interest 0 0 0 0 0 0 0  Down, Depressed, Hopeless 0 0 0 0 0 0 0  PHQ - 2 Score 0 0 0 0 0 0 0    Review of Systems  Constitutional: Negative.   HENT: Negative.   Eyes: Negative.   Respiratory: Negative.   Cardiovascular: Positive for leg swelling.  Gastrointestinal: Negative.   Endocrine: Negative.   Genitourinary: Negative.   Musculoskeletal: Positive for gait problem.  Skin: Negative.   Allergic/Immunologic: Negative.   Hematological: Negative.   Psychiatric/Behavioral: Negative.   All other systems reviewed and are negative.     Objective:   Physical Exam Constitutional: She appears well-developed. NAD. HENT: Normocephalic. Atraumatic Eyes: EOM are normal. No discharge.  Cardiovascular: RRR. No JVD. Respiratory: Effort normal and breath sounds normal.  GI: Soft. Bowel sounds are normal.  Musculoskeletal:  Right AKA Neurological: She is alert and oriented.  Motor: B/L UE 5/5 proximal to distal LLE: 5/5 proximal to distal RLE: HF 5/5 HF  Skin: Incision healed.  Graft sites c/d/i.Marland Kitchen Psychiatric: She has a normal mood and affect. Her behavior is normal.     Assessment & Plan:  66 year old right-handed female, history of polycythemia vera, diagnosed July 2016, CAD, hypertension, atrial fibrillation presents for  follow up for right AKA.  1. Right AKA 05/21/2017 as well as PVD with multiple revascularization procedures   Begin outpatient therapy for gait training, now what  patient has prosthesis, needs adjustment  2. Phantom limb Pain:   Cont Gabapentin 100 TID PRN, good benefit  3. Gait abnormality with falls  Cont walker/wheelchair for safety  Cont therapies

## 2017-11-04 ENCOUNTER — Ambulatory Visit (HOSPITAL_COMMUNITY)
Admission: RE | Admit: 2017-11-04 | Discharge: 2017-11-04 | Disposition: A | Discharge status: Home / Self Care | Payer: Medicare Other | Source: Ambulatory Visit | Attending: Oncology | Admitting: Oncology

## 2017-11-04 DIAGNOSIS — D45 Polycythemia vera: Secondary | ICD-10-CM | POA: Insufficient documentation

## 2017-11-04 LAB — POCT HEMOGLOBIN-HEMACUE: Hemoglobin: 15.9 g/dL — ABNORMAL HIGH (ref 12.0–15.0)

## 2017-11-04 NOTE — Progress Notes (Addendum)
Hemocue checked hgb 15.9.  Phlebotomized 500cc via left AC.  Pt tolerated well.

## 2017-11-05 ENCOUNTER — Encounter: Payer: Medicare Other | Admitting: Physical Therapy

## 2017-11-08 ENCOUNTER — Other Ambulatory Visit (INDEPENDENT_AMBULATORY_CARE_PROVIDER_SITE_OTHER): Payer: Medicare Other

## 2017-11-08 DIAGNOSIS — D45 Polycythemia vera: Secondary | ICD-10-CM

## 2017-11-09 ENCOUNTER — Telehealth: Payer: Self-pay | Admitting: *Deleted

## 2017-11-09 ENCOUNTER — Other Ambulatory Visit: Payer: Self-pay | Admitting: Oncology

## 2017-11-09 ENCOUNTER — Ambulatory Visit: Payer: Medicare Other | Attending: Physical Medicine & Rehabilitation | Admitting: Physical Therapy

## 2017-11-09 ENCOUNTER — Encounter: Payer: Self-pay | Admitting: Physical Therapy

## 2017-11-09 DIAGNOSIS — R2689 Other abnormalities of gait and mobility: Secondary | ICD-10-CM | POA: Diagnosis present

## 2017-11-09 DIAGNOSIS — R296 Repeated falls: Secondary | ICD-10-CM | POA: Insufficient documentation

## 2017-11-09 DIAGNOSIS — M6281 Muscle weakness (generalized): Secondary | ICD-10-CM | POA: Diagnosis present

## 2017-11-09 DIAGNOSIS — R2681 Unsteadiness on feet: Secondary | ICD-10-CM | POA: Diagnosis present

## 2017-11-09 DIAGNOSIS — D45 Polycythemia vera: Secondary | ICD-10-CM

## 2017-11-09 LAB — CBC WITH DIFFERENTIAL/PLATELET
BASOS: 5 %
Basophils Absolute: 1.4 10*3/uL — ABNORMAL HIGH (ref 0.0–0.2)
EOS (ABSOLUTE): 0.3 10*3/uL (ref 0.0–0.4)
Eos: 1 %
HEMATOCRIT: 50 % — AB (ref 34.0–46.6)
Hemoglobin: 15.4 g/dL (ref 11.1–15.9)
LYMPHS: 2 %
Lymphocytes Absolute: 0.5 10*3/uL — ABNORMAL LOW (ref 0.7–3.1)
MCH: 22.8 pg — AB (ref 26.6–33.0)
MCHC: 30.8 g/dL — ABNORMAL LOW (ref 31.5–35.7)
MCV: 74 fL — AB (ref 79–97)
MONOCYTES: 5 %
Monocytes Absolute: 1.4 10*3/uL — ABNORMAL HIGH (ref 0.1–0.9)
NEUTROS ABS: 23.5 10*3/uL — AB (ref 1.4–7.0)
NRBC: 1 % — AB (ref 0–0)
Neutrophils: 86 %
Platelets: 352 10*3/uL (ref 150–379)
RBC: 6.74 x10E6/uL — ABNORMAL HIGH (ref 3.77–5.28)
RDW: 18.5 % — ABNORMAL HIGH (ref 12.3–15.4)
WBC: 27.3 10*3/uL (ref 3.4–10.8)

## 2017-11-09 LAB — IMMATURE CELLS: Metamyelocytes: 1 % — ABNORMAL HIGH (ref 0–0)

## 2017-11-09 NOTE — Telephone Encounter (Signed)
OK noted.

## 2017-11-09 NOTE — Therapy (Signed)
[facility] [address] [place], [place], [address] Phone: [phone]   Fax:  [phone]  Physical Therapy Evaluation  Patient Details  Name: [name] MRN: [id number] Date of Birth: June 14, 1951 Referring Provider: Jamse Arn, MD   Encounter Date: 11/09/2017  PT End of Session - 11/09/17 1036    Visit Number  1    Number of Visits  34    Date for PT Re-Evaluation  01/07/18    Authorization Type  Medicare G-code & progress note every 10th visit    PT Start Time  0925    PT Stop Time  1013    PT Time Calculation (min)  48 min    Equipment Utilized During Treatment  Gait belt    Activity Tolerance  Patient tolerated treatment well    Behavior During Therapy  [facility] for tasks assessed/performed       Past Medical History:  Diagnosis Date  . Arthritis    "fingers, some in my knees" (01/28/2016)  . CAD (coronary artery disease), per cath 04/27/13, non obstructive disease, EF 65-70% 05/11/2013  . Carotid disease, bilateral ([place]) 09/2013   Lt carotid 50-69% stentosis, less on rt  . Dysrhythmia   . Fatty liver   . GERD (gastroesophageal reflux disease)   . Glaucoma   . H/O cardiovascular stress test 12/27/2009   negative for ischemia  . Headache    "occasional since eye pressure regulated" (01/28/2016)  . Hyperlipidemia   . Hypertension   . Leukocytosis 07/15/2015  . Migraine    "stopped w/laser holes to relieve pressure in my eyes; had them 3-4 times/wk before taht" (01/28/2016)  . Mitral regurgitation,2+ by cath 05/11/2013  . NSTEMI (non-ST elevated myocardial infarction) ([place]) 04/27/2013  . Pain    BOTH KNEES - PT HAS TORN MENISCUS LEFT KNEE  . Paroxysmal atrial fibrillation (HCC)    hx of a fib on ASA  AND ELIQUIS   . Polycythemia vera ([place]) 08/20/2015   JAK-2 positive 07/19/15  . PVD (peripheral vascular disease) with claudication ([place]) 07/2006   previous L ext iliac stent and rt SFA stent, known occluded Lt SFA   . S/P cardiac cath 04/27/13   NON OBSTRUCTIVE DISEASE, 2+ mr, ef 65-70%  . Statin intolerance   . Stroke ([place]) 2006   SWELLING ALL OVER AND RT SIDE OF FACE DRAWN AND SPEECH SLURRED AND NUMBNESS ON RIGHT SIDE-- ALL RESOLVED    Past Surgical History:  Procedure Laterality Date  . AMPUTATION Right 05/21/2017   Procedure: AMPUTATION ABOVE KNEE;  Surgeon: Rosetta Posner, MD;  Location: [place];  Service: Vascular;  Laterality: Right;  . CARDIAC CATHETERIZATION  04/27/13   non occlusive disease with mild to mod. calcified lesions in ostial LM and proximal LAD and moderate prox. RC AND DISTAL AV GROOVE LCX, ef  . CATARACT EXTRACTION W/PHACO Right 03/21/2015   Procedure: CATARACT EXTRACTION PHACO AND INTRAOCULAR LENS PLACEMENT RIGHT EYE;  Surgeon: Baruch Goldmann, MD;  Location: [facility];  Service: Ophthalmology;  Laterality: Right;  CDE:5.10  . CATARACT EXTRACTION W/PHACO Left 05/16/2015   Procedure: CATARACT EXTRACTION PHACO AND INTRAOCULAR LENS PLACEMENT (IOC);  Surgeon: Baruch Goldmann, MD;  Location: [facility];  Service: Ophthalmology;  Laterality: Left;  CDE 5.57  . [place]  . COLONOSCOPY N/A 09/11/2016   Procedure: COLONOSCOPY;  Surgeon: Rogene Houston, MD;  [facility];  Service: Endoscopy;  Laterality: N/A;  730-moved to 1:00 Ann notified pt  .  EMBOLECTOMY Right 02/01/2016   Procedure: Thrombectomy of Right Femoral-Popliteal Bypass Graft; Endarterectomy of Right Below Knee Popliteal Artery and Tibial Peroneal Trunk with Bovine Pericardium Patch Angioplasty.;  Surgeon: Angelia Mould, MD;  Location: [facility];  Service: Vascular;  Laterality: Right;  . FEMORAL-POPLITEAL BYPASS GRAFT Right 01/31/2016   Procedure: BYPASS GRAFT RIGHT COMMON  FEMORALTO BELOW KNEE POPLITEAL ARTERY BYPASS GRAFT USING 6MM PROPATEN GORTEX GRAFT;  Surgeon: Mal Misty, MD;  Location: [place];  Service: Vascular;  Laterality: Right;  . FEMORAL-POPLITEAL BYPASS GRAFT Right 01/09/2017   Procedure: THROMBECTOMY  OF RIGHT FEMORAL-POPLITEAL  ARTERY BYPASS GRAFT; THROMBECTOMY RIGHT  TIBIAL VESSELS;  Surgeon: [name], MD;  Location: [place];  Service: Vascular;  Laterality: Right;  . FEMORAL-POPLITEAL BYPASS GRAFT Right 05/17/2017   Procedure: RIGHT FEMORAL-TIBIAL PERONEAL TRUNK ARTERY BYPASS GRAFT USING 68mmX80cm PROPATEN GRAFT WITH REMOVABLE RING;  Surgeon: Rosetta Posner, MD;  Location: [place];  Service: Vascular;  Laterality: Right;  . FRACTURE SURGERY    . ILIAC ARTERY STENT  07/2006   external iliac and Rt SFA stent 07/2006 AND THE LEFT WAS IN 2006  . KNEE ARTHROSCOPY WITH MEDIAL MENISECTOMY Left 03/27/2014   Procedure: left knee arthorscopy with medial chondraplasty of the medial femoral and patella, medial microfracture technique of medial femoral condyl;  Surgeon: Tobi Bastos, MD;  Location: [facility];  Service: Orthopedics;  Laterality: Left;  . LEFT HEART CATHETERIZATION WITH CORONARY ANGIOGRAM Right 04/27/2013   Procedure: LEFT HEART CATHETERIZATION WITH CORONARY ANGIOGRAM;  Surgeon: Leonie Man, MD;  Location: [facility] CATH LAB;  Service: Cardiovascular;  Laterality: Right;  . LOWER EXTREMITY ANGIOGRAM Right 01/31/2016   Procedure: INTRAOP RIGHT LOWER EXTREMITY ANGIOGRAM;  Surgeon: Mal Misty, MD;  Location: [place];  Service: Vascular;  Laterality: Right;  . ORIF WRIST FRACTURE Right 1983  . OVARIAN CYST REMOVAL Right   . PATCH ANGIOPLASTY Right 01/31/2016   Procedure: RIGHT COMMON FEMORAL AND PROFUNDA FEMORIS ENDARECTOMY WITH PATCH ANGIOPLASTY;  Surgeon: Mal Misty, MD;  Location: [place];  Service: Vascular;  Laterality: Right;  . PATCH ANGIOPLASTY Right 01/09/2017   Procedure: PATCH ANGIOPLASTY RIGHT POPLITEAL ARTERY BYPASS GRAFT;  Surgeon: [name], MD;  Location: [place];  Service: Vascular;  Laterality: Right;  . PERIPHERAL VASCULAR CATHETERIZATION N/A 06/27/2015   Procedure: Lower Extremity Angiography;  Surgeon: Lorretta Harp, MD;  Location: [place] CV LAB;  Service:  Cardiovascular;  Laterality: N/A;  . PERIPHERAL VASCULAR CATHETERIZATION Bilateral 01/29/2016   Procedure: Lower Extremity Angiography;  Surgeon: Wellington Hampshire, MD;  Location: [place] CV LAB;  Service: Cardiovascular;  Laterality: Bilateral;  . PERIPHERAL VASCULAR CATHETERIZATION N/A 01/29/2016   Procedure: Abdominal Aortogram;  Surgeon: Wellington Hampshire, MD;  Location: [place] CV LAB;  Service: Cardiovascular;  Laterality: N/A;  . REFRACTIVE SURGERY Bilateral    "6 in one eye; 7 in the other; to relieve pressure; not glaucoma" (01/28/2016)  . SFA Right 06/27/2015   overlapping Bahn covered stents  . TUBAL LIGATION  1983  . VEIN HARVEST Left 05/17/2017   Procedure: LEFT LEG GREATER SAPHENOUS VEIN HARVEST;  Surgeon: Rosetta Posner, MD;  Location: [place];  Service: Vascular;  Laterality: Left;  Marland Kitchen VEIN REPAIR Right 01/31/2016   Procedure: RIGHT GREATER SAPHENOUS VEIN EXAMNED BUT NOT REMOVED;  Surgeon: Mal Misty, MD;  Location: [place];  Service: Vascular;  Laterality: Right;    There were no vitals filed for this visit.   Subjective  Assessment - 11/09/17 0927    Subjective  This 66yo female was referred for PT prosthetic evaluation for Transfemoral Amputation by Jamse Arn, MD. She underwent a right Transfemoral Amputation on 05/21/2017 due to failed femoral-tibial peroneal trunk artery bypass. She recieved prosthesis 09/21/2017 She presents to evaluation in w/c not wearing prosthesis.     Pertinent History  right TFA, arthritis, CAD,A-Fib, PVD, CVA, Glaucoma, migraines, HTN, NSTEMI,  knee pain/left torn meniscus,     Limitations  Lifting;Standing;Walking    Patient Stated Goals  To use prosthesis walking. Previously 2-4 miles.     Currently in Pain?  No/denies         [facility] PT Assessment - 11/09/17 7510      Assessment   Medical Diagnosis  Right Transfemoral Amputation    Referring Provider  Ankit Lorie Phenix, MD    Onset Date/Surgical Date  09/21/17 prosthesis delivery      Hand Dominance  Right    Prior Therapy  inpatient rehab      Precautions   Precautions  Fall      Balance Screen   Has the patient fallen in the past 6 months  Yes    How many times?  3 1 in hospital, 2nd RW closing door, 3rd RW moving blanket    Has the patient had a decrease in activity level because of a fear of falling?   No    Is the patient reluctant to leave their home because of a fear of falling?   No      Home Environment   Living Environment  Private residence    Living Arrangements  Other relatives grandchildren 56yo & 19yo    Type of [place] entrance;Stairs to enter primary ramp    Entrance Stairs-Number of Steps  6    Entrance Stairs-Rails  Right;Left;Cannot reach both    Home Layout  One level    [place] - 2 wheels;Cane - single point;Crutches;Bedside commode;Tub bench;Grab bars - tub/shower;Wheelchair - manual      Prior Function   Level of Independence  Independent;Independent with household mobility without device;Independent with community mobility without device;Independent with gait no device    Vocation  Retired    Leisure  Science writer  Postural limitations    Postural Limitations  Forward head;Rounded Shoulders;Flexed trunk;Weight shift left      ROM / Strength   AROM / PROM / Strength  AROM;Strength      AROM   Overall AROM   Within functional limits for tasks performed      Strength   Overall Strength  Deficits    Overall Strength Comments  BUEs & LLE grossly WFL     Strength Assessment Site  Hip    Right/Left Hip  Right    Right Hip Flexion  4/5    Right Hip Extension  3/5    Right Hip ABduction  3+/5      Transfers   Transfers  Sit to Stand;Stand to Sit    Sit to Stand  5: Supervision;With upper extremity assist;With armrests;From chair/3-in-1 to RW for stabilization    Sit to Stand Details (indicate cue type and reason)  prosthetic knee control  slow but extends knee upon arising    Stand to Sit  5: Supervision;With upper extremity assist;With armrests;To chair/3-in-1 from RW for stabilization    Stand to Sit  Details  prosthetic knee verbal cues to unweight to unlock prior to sitting.       Ambulation/Gait   Ambulation/Gait  Yes    Ambulation/Gait Assistance  4: Min guard    Ambulation/Gait Assistance Details  PT demo, verbal cues on sequence for step through pattern, upright trunk and prosthesis control including knee extending at initial contact    Ambulation Distance (Feet)  50 Feet    Assistive device  Rolling walker;Prosthesis    Gait Pattern  Step-to pattern;Step-through pattern;Decreased step length - left;Decreased stance time - right;Decreased stride length;Decreased hip/knee flexion - right;Decreased weight shift to right;Right circumduction;Right hip hike;Antalgic;Trunk flexed;Abducted- right;Poor foot clearance - right PT instructed for step-to to step thru pattern    Ambulation Surface  Indoor;Level    Gait velocity  0.44 ft/sec      Standardized Balance Assessment   Standardized Balance Assessment  Berg Balance Test      Berg Balance Test   Sit to Stand  Needs minimal aid to stand or to stabilize with RW for stabilization = 3    Standing Unsupported  Able to stand 2 minutes with supervision RW = 4    Sitting with Back Unsupported but Feet Supported on Floor or Stool  Able to sit safely and securely 2 minutes    Stand to Sit  Sits independently, has uncontrolled descent RW = 3    Transfers  Able to transfer safely, minor use of hands    Standing Unsupported with Eyes Closed  Needs help to keep from falling RW = 3    Standing Ubsupported with Feet Together  Needs help to attain position but able to stand for 30 seconds with feet together RW = 3    From Standing, Reach Forward with Outstretched Arm  Loses balance while trying/requires external support RW = 3    From Standing Position, Pick up Object from Floor  Unable to  try/needs assist to keep balance RW = 2    From Standing Position, Turn to Look Behind Over each Shoulder  Needs assist to keep from losing balance and falling RW = 3    Turn 360 Degrees  Needs assistance while turning RW = 0    Standing Unsupported, Alternately Place Feet on Step/Stool  Needs assistance to keep from falling or unable to try RW = 0    Standing Unsupported, One Foot in [employer] balance while stepping or standing RW = 2    Standing on One Leg  Unable to try or needs assist to prevent fall RW = 4    Total Score  14    Berg comment:  RW = 36/56      Prosthetics Assessment - 11/09/17 0925      Prosthetics   Prosthetic Care Dependent with  Skin check;Residual limb care;Care of non-amputated limb;Prosthetic cleaning;Ply sock cleaning;Correct ply sock adjustment;Proper wear schedule/adjustment;Proper weight-bearing schedule/adjustment    Donning prosthesis   Supervision    Doffing prosthesis   Supervision    Current prosthetic wear tolerance (days/week)   worn 3 of 49 days since delivery    Current prosthetic wear tolerance (#hours/day)   worn 30 min to 2 hrs. However bruise on distal abdomen after wear 2 hrs.     Current prosthetic weight-bearing tolerance (hours/day)   Patient tolerated 5 minutes of standing with partial weight on prosthesis with no c/o pain or limb discomfort    Edema  none    Residual limb condition   no open  areas, normal color & temperature, no hair growth, cylinderical shape    K code/activity level with prosthetic use   K2 basic community with fixed cadence            Objective measurements completed on examination: See above findings.      [facility] Adult PT Treatment/Exercise - 11/09/17 0925      Prosthetics   Prosthetic Care Comments   PT instructed in sitting positioning with prosthesis. Wear 2 hrs 2x/day with increase weekly if no issues.     Education Provided  Skin check;Residual limb care;Prosthetic cleaning;Correct ply sock  adjustment;Proper Donning;Proper wear schedule/adjustment;Other (comment) see prosthetic care comments    Person(s) Educated  Patient    Education Method  Explanation;Demonstration;Tactile cues;Verbal cues    Education Method  Verbalized understanding;Returned demonstration;Tactile cues required;Verbal cues required;Needs further instruction               PT Short Term Goals - 11/09/17 1156      PT SHORT TERM GOAL #1   Title  Patient demonstrates proper donning of prosthesis including tightening suspension strap. (All STGs Target Date: 12/09/2017)    Time  1    Status  New    Target Date  12/09/17      PT SHORT TERM GOAL #2   Title  Patient tolerates wear of prosthesis >8hrs total/day with skin issues or limb pain.     Time  1    Period  Months    Status  New    Target Date  12/09/17      PT SHORT TERM GOAL #3   Title  Patient standing balance reaches 5" anteriorly without UE support and to ground with UE support with supervision.     Time  1    Period  Months    Status  New    Target Date  12/09/17      PT SHORT TERM GOAL #4   Title  Patient ambulates 300' with RW & prosthesis with supervision.     Time  1    Period  Months    Status  New    Target Date  12/09/17      PT SHORT TERM GOAL #5   Title  Patient negotiates stairs (2 rails), ramps & curbs with RW & prosthesis with supervision.     Time  1    Period  Months    Status  New    Target Date  12/09/17        PT Long Term Goals - 11/09/17 1053      PT LONG TERM GOAL #1   Title  Patient verbalizes & demonstrates proper prosthetic care to enable safe use of prosthesis. (All LTGs Target Date: 03/04/2018)    Time  4    Period  Months    Status  New    Target Date  03/04/18      PT LONG TERM GOAL #2   Title  Patient tolerates wear of prosthesis >90% of awake hours without skin issues or limb pain to enable function during her day.     Time  4    Period  Months    Status  New    Target Date  03/04/18       PT LONG TERM GOAL #3   Title  Patient ambulates household around furniture carrying items with cane or less & prosthesis modified independent.     Time  4    Period  Months  Status  New    Target Date  03/04/18      PT LONG TERM GOAL #4   Title  Berg Balance >/= 45/56 to indicate lower fall risk.     Time  4    Period  Months    Status  New    Target Date  03/04/18      PT LONG TERM GOAL #5   Title  Patient ambulates >1000' with LRAD & prosthesis outdoors including grass, ramps & curbs modified independent.     Time  4    Period  Months    Status  New    Target Date  03/04/18      Additional Long Term Goals   Additional Long Term Goals  Yes      PT LONG TERM GOAL #6   Title  Patient performs standing ADLs including cooking, laundry, sweeping, etc with prosthesis & LRAD modified indepedent.     Time  4    Period  Months    Status  New    Target Date  03/04/18             Plan - 11/09/17 1043    Clinical Impression Statement  This 66yo female underwent a right Transfemoral Amputation 05/21/2017 and recieved her first prosthesis on 09/21/2017. She is dependent in proper use & care of prosthesis which increases risk of skin issues & other issues with improper prosthetic care. She has only worn the prosthesis 3 times since delivery 7 weeks ago. When she wore it for 2 hours, she had a bruise on abdomen from proximal socket pressure in sitting. Patient has impaired balance with Merrilee Jansky Balance 14/56 indicating 100% risk of falls. She is dependent in standing balance activities with walker support. Patient ambulates with partial weight on prosthesis using rolling walker with gait deviations & gait velocity of 0.44 ft/sec indicating fall risk. Patient would benefit from skilled care to progress function, safety & mobility with transfemoral prosthesis.                                     History and Personal Factors relevant to plan of care:  right TFA, arthritis, CAD,A-Fib, PVD,  CVA, Glaucoma, migraines, HTN, NSTEMI,  knee pain/left torn meniscus,     Clinical Presentation  Evolving    Clinical Presentation due to:  Lives & cares for 2 grandchildren (57yo & 16yo), unknowledgeable in prosthetic care & use with limited wear, w/c bound without prosthesis, cardiac history    Clinical Decision Making  Moderate    Rehab Potential  Good    Clinical Impairments Affecting Rehab Potential  right TFA, arthritis, CAD,A-Fib, PVD, CVA, Glaucoma, migraines, HTN, NSTEMI,  knee pain/left torn meniscus,     PT Frequency  2x / week    PT Duration  Other (comment) 4 months (17 weeks), will need recert at 2 months    PT Treatment/Interventions  ADLs/Self Care Home Management;DME Instruction;Gait training;Stair training;Functional mobility training;Therapeutic activities;Therapeutic exercise;Balance training;Neuromuscular re-education;Patient/family education;Prosthetic Training    PT Next Visit Plan  Review prosthetic care, instruct in HEP sink, prosthetic gait with RW, instruct in stairs, curbs & ramps    Consulted and Agree with Plan of Care  Patient       Patient will benefit from skilled therapeutic intervention in order to improve the following deficits and impairments:  Abnormal gait, Decreased activity tolerance, Decreased balance, Decreased endurance, Decreased  knowledge of use of DME, Decreased mobility, Decreased strength, Prosthetic Dependency, Postural dysfunction  Visit Diagnosis: Other abnormalities of gait and mobility  Unsteadiness on feet  Muscle weakness (generalized)  Repeated falls  G-Codes - 2017-11-13 1213    Functional Assessment Tool Used (Outpatient Only)  patient is dependent in prosthetic care. Patient has worn prosthesis 3 of 49 days since delivery up to 2hrs but caused bruising.     Functional Limitation  Self care    Self Care Current Status [phone])  100 percent impaired, limited or restricted    Self Care Goal Status (V3710)  At least 1 percent but less  than 20 percent impaired, limited or restricted        Problem List Patient Active Problem List   Diagnosis Date Noted  . Phantom limb pain ([place])   . Hypertensive crisis   . Induration of skin   . Abnormality of gait   . Hypoalbuminemia due to protein-calorie malnutrition ([place])   . Amputee, above knee, right ([place]) 05/25/2017  . Unilateral AKA, right ([place])   . Elevated troponin   . PVD (peripheral vascular disease) ([place])   . Benign essential HTN   . Acute blood loss anemia   . Ischemia of right lower extremity 05/17/2017  . Thrombosis of right femoral artery ([place]) 01/09/2017  . Ischemia of extremity 01/09/2017  . History of colonic polyps 07/30/2016  . Essential hypertension 01/08/2016  . Polycythemia vera ([place]) 08/20/2015  . Leukocytosis 07/15/2015  . S/P arterial stent right SFA overlapping Bahn covered stents 06/27/2015 06/28/2015  . Claudication ([place]) 06/27/2015  . Carotid artery disease ([place]) 08/24/2014  . Osteoarthritis of left knee 03/27/2014  . External hemorrhoids 06/06/2013  . PAF (paroxysmal atrial fibrillation), maintaing SR 05/11/2013  . Chronic anticoagulation, new- eliquis 05/11/2013  . CAD (coronary artery disease), per cath 04/27/13, non obstructive disease 20-30% LM; LAD 30%; 50-60% OM2; RCA 40%; EF 65-70% 05/11/2013  . Mitral regurgitation,2+ by cath 05/11/2013  . Atrial fibrillation with RVR, hx of PAF previously 04/27/2013  . PAD (peripheral artery disease), Lt carotid 50-70%: , Hx stent Lt exteral iliac & RSFA stent, known occl. LSFA  04/27/2013  . Hyperlipidemia 04/27/2013  . History of CVA (cerebrovascular accident) 04/27/2013  . NSTEMI (non-ST elevated myocardial infarction) ([place]) 04/27/2013    Aryona Sill PT, DPT Nov 13, 2017, 12:16 PM  [facility] [place] [address] [place], [place], [address] Phone: [phone]   Fax:  [phone]  Name: KRIYA WESTRA MRN: [id number] Date of Birth:  [birth year]

## 2017-11-09 NOTE — Telephone Encounter (Signed)
Pt called / informed " need to stay on monthly phlebotomy." per Dr Beryle Beams. Stated her last one was last Thursday 11/15 and next one has already been scheduled 12/12 at [facility].

## 2017-11-09 NOTE — Telephone Encounter (Signed)
-----   Message from Annia Belt, MD sent at 11/09/2017  2:13 PM EST ----- Call pt: we need to stay on monthly phlebotomy. I put in orders: next is Thurs 12/20. Please schedule w short stay.

## 2017-11-10 ENCOUNTER — Encounter: Payer: Self-pay | Admitting: Physical Therapy

## 2017-11-10 ENCOUNTER — Ambulatory Visit: Payer: Medicare Other | Admitting: Physical Therapy

## 2017-11-10 DIAGNOSIS — R2689 Other abnormalities of gait and mobility: Secondary | ICD-10-CM

## 2017-11-10 DIAGNOSIS — M6281 Muscle weakness (generalized): Secondary | ICD-10-CM

## 2017-11-10 DIAGNOSIS — R2681 Unsteadiness on feet: Secondary | ICD-10-CM

## 2017-11-11 NOTE — Therapy (Signed)
[facility] [address] [place], [place], [address] Phone: [phone]   Fax:  [phone]  Physical Therapy Treatment  Patient Details  Name: [name] MRN: [id number] Date of Birth: 1951/05/29 Referring Provider: Jamse Arn, MD   Encounter Date: 11/10/2017  PT End of Session - 11/10/17 1907    Visit Number  2    Number of Visits  34    Date for PT Re-Evaluation  01/07/18    Authorization Type  Medicare G-code & progress note every 10th visit    PT Start Time  0928    PT Stop Time  1015    PT Time Calculation (min)  47 min    Equipment Utilized During Treatment  Gait belt    Activity Tolerance  Patient tolerated treatment well    Behavior During Therapy  [facility] for tasks assessed/performed       Past Medical History:  Diagnosis Date  . Arthritis    "fingers, some in my knees" (01/28/2016)  . CAD (coronary artery disease), per cath 04/27/13, non obstructive disease, EF 65-70% 05/11/2013  . Carotid disease, bilateral ([place]) 09/2013   Lt carotid 50-69% stentosis, less on rt  . Dysrhythmia   . Fatty liver   . GERD (gastroesophageal reflux disease)   . Glaucoma   . H/O cardiovascular stress test 12/27/2009   negative for ischemia  . Headache    "occasional since eye pressure regulated" (01/28/2016)  . Hyperlipidemia   . Hypertension   . Leukocytosis 07/15/2015  . Migraine    "stopped w/laser holes to relieve pressure in my eyes; had them 3-4 times/wk before taht" (01/28/2016)  . Mitral regurgitation,2+ by cath 05/11/2013  . NSTEMI (non-ST elevated myocardial infarction) ([place]) 04/27/2013  . Pain    BOTH KNEES - PT HAS TORN MENISCUS LEFT KNEE  . Paroxysmal atrial fibrillation (HCC)    hx of a fib on ASA  AND ELIQUIS   . Polycythemia vera ([place]) 08/20/2015   JAK-2 positive 07/19/15  . PVD (peripheral vascular disease) with claudication ([place]) 07/2006   previous L ext iliac stent and rt SFA stent, known occluded Lt SFA   . S/P cardiac cath 04/27/13   NON OBSTRUCTIVE DISEASE, 2+ mr, ef 65-70%  . Statin intolerance   . Stroke ([place]) 2006   SWELLING ALL OVER AND RT SIDE OF FACE DRAWN AND SPEECH SLURRED AND NUMBNESS ON RIGHT SIDE-- ALL RESOLVED    Past Surgical History:  Procedure Laterality Date  . AMPUTATION Right 05/21/2017   Procedure: AMPUTATION ABOVE KNEE;  Surgeon: Rosetta Posner, MD;  Location: [facility];  Service: Vascular;  Laterality: Right;  . CARDIAC CATHETERIZATION  04/27/13   non occlusive disease with mild to mod. calcified lesions in ostial LM and proximal LAD and moderate prox. RC AND DISTAL AV GROOVE LCX, ef  . CATARACT EXTRACTION W/PHACO Right 03/21/2015   Procedure: CATARACT EXTRACTION PHACO AND INTRAOCULAR LENS PLACEMENT RIGHT EYE;  Surgeon: Baruch Goldmann, MD;  [facility];  Service: Ophthalmology;  Laterality: Right;  CDE:5.10  . CATARACT EXTRACTION W/PHACO Left 05/16/2015   Procedure: CATARACT EXTRACTION PHACO AND INTRAOCULAR LENS PLACEMENT (IOC);  Surgeon: Baruch Goldmann, MD;  Location: [facility];  Service: Ophthalmology;  Laterality: Left;  CDE 5.57  . [place]  . COLONOSCOPY N/A 09/11/2016   Procedure: COLONOSCOPY;  Surgeon: Rogene Houston, MD;  [facility];  Service: Endoscopy;  Laterality: N/A;  730-moved to 1:00 Ann notified pt  .  EMBOLECTOMY Right 02/01/2016   Procedure: Thrombectomy of Right Femoral-Popliteal Bypass Graft; Endarterectomy of Right Below Knee Popliteal Artery and Tibial Peroneal Trunk with Bovine Pericardium Patch Angioplasty.;  Surgeon: Angelia Mould, MD;  Location: [facility];  Service: Vascular;  Laterality: Right;  . FEMORAL-POPLITEAL BYPASS GRAFT Right 01/31/2016   Procedure: BYPASS GRAFT RIGHT COMMON  FEMORALTO BELOW KNEE POPLITEAL ARTERY BYPASS GRAFT USING 6MM PROPATEN GORTEX GRAFT;  Surgeon: Mal Misty, MD;  Location: [facility];  Service: Vascular;  Laterality: Right;  . FEMORAL-POPLITEAL BYPASS GRAFT Right 01/09/2017   Procedure: THROMBECTOMY  OF RIGHT FEMORAL-POPLITEAL  ARTERY BYPASS GRAFT; THROMBECTOMY RIGHT  TIBIAL VESSELS;  Surgeon: [name], MD;  Location: [facility];  Service: Vascular;  Laterality: Right;  . FEMORAL-POPLITEAL BYPASS GRAFT Right 05/17/2017   Procedure: RIGHT FEMORAL-TIBIAL PERONEAL TRUNK ARTERY BYPASS GRAFT USING 54mX80cm PROPATEN GRAFT WITH REMOVABLE RING;  Surgeon: ERosetta Posner MD;  [facility]  Service: Vascular;  Laterality: Right;  . FRACTURE SURGERY    . ILIAC ARTERY STENT  07/2006   external iliac and Rt SFA stent 07/2006 AND THE LEFT WAS IN 2006  . KNEE ARTHROSCOPY WITH MEDIAL MENISECTOMY Left 03/27/2014   Procedure: left knee arthorscopy with medial chondraplasty of the medial femoral and patella, medial microfracture technique of medial femoral condyl;  Surgeon: RTobi Bastos MD;  [facility];  Service: Orthopedics;  Laterality: Left;  . LEFT HEART CATHETERIZATION WITH CORONARY ANGIOGRAM Right 04/27/2013   Procedure: LEFT HEART CATHETERIZATION WITH CORONARY ANGIOGRAM;  Surgeon: DLeonie Man MD;  [facility];  Service: Cardiovascular;  Laterality: Right;  . LOWER EXTREMITY ANGIOGRAM Right 01/31/2016   Procedure: INTRAOP RIGHT LOWER EXTREMITY ANGIOGRAM;  Surgeon: JMal Misty MD;  [facility]  Service: Vascular;  Laterality: Right;  . ORIF WRIST FRACTURE Right 1983  . OVARIAN CYST REMOVAL Right   . PATCH ANGIOPLASTY Right 01/31/2016   Procedure: RIGHT COMMON FEMORAL AND PROFUNDA FEMORIS ENDARECTOMY WITH PATCH ANGIOPLASTY;  Surgeon: JMal Misty MD;  [facility]  Service: Vascular;  Laterality: Right;  . PATCH ANGIOPLASTY Right 01/09/2017   Procedure: PATCH ANGIOPLASTY RIGHT POPLITEAL ARTERY BYPASS GRAFT;  Surgeon: CElam Dutch MD;  [facility]  Service: Vascular;  Laterality: Right;  . PERIPHERAL VASCULAR CATHETERIZATION N/A 06/27/2015   Procedure: Lower Extremity Angiography;  Surgeon: JLorretta Harp MD;  [facility];  Service:  Cardiovascular;  Laterality: N/A;  . PERIPHERAL VASCULAR CATHETERIZATION Bilateral 01/29/2016   Procedure: Lower Extremity Angiography;  Surgeon: MWellington Hampshire MD;  [facility];  Service: Cardiovascular;  Laterality: Bilateral;  . PERIPHERAL VASCULAR CATHETERIZATION N/A 01/29/2016   Procedure: Abdominal Aortogram;  Surgeon: MWellington Hampshire MD;  [facility];  Service: Cardiovascular;  Laterality: N/A;  . REFRACTIVE SURGERY Bilateral    "6 in one eye; 7 in the other; to relieve pressure; not glaucoma" (01/28/2016)  . SFA Right 06/27/2015   overlapping Bahn covered stents  . TUBAL LIGATION  1983  . VEIN HARVEST Left 05/17/2017   Procedure: LEFT LEG GREATER SAPHENOUS VEIN HARVEST;  Surgeon: ERosetta Posner MD;  [facility]  Service: Vascular;  Laterality: Left;  .Marland KitchenVEIN REPAIR Right 01/31/2016   Procedure: RIGHT GREATER SAPHENOUS VEIN EXAMNED BUT NOT REMOVED;  Surgeon: JMal Misty MD;  [facility]  Service: Vascular;  Laterality: Right;    There were no vitals filed for this visit.  Subjective Assessment -  11/10/17 0930    Subjective  No pain after yesterday standing & gait     Pertinent History  right TFA, arthritis, CAD,A-Fib, PVD, CVA, Glaucoma, migraines, HTN, NSTEMI,  knee pain/left torn meniscus,     Limitations  Lifting;Standing;Walking    Patient Stated Goals  To use prosthesis walking. Previously 2-4 miles.     Currently in Pain?  No/denies                      [facility] Adult PT Treatment/Exercise - 11/10/17 0930      Transfers   Transfers  Sit to Stand;Stand to Sit    Sit to Stand  5: Supervision;With upper extremity assist;With armrests;From chair/3-in-1;4: Min assist Supervision from chairs w/armrests & MinA chair wno armrests    Sit to Stand Details (indicate cue type and reason)  verbal & demo cues on technique with TFA prosthesis    Stand to Sit  5: Supervision;4: Min guard;With upper extremity assist;With armrests;To  chair/3-in-1 from [place], [place] guard chairs w/o armrests    Stand to [employer] Details  verbal & demo cues on technique with TFA prosthesis      Ambulation/Gait   Ambulation/Gait  Yes    Ambulation/Gait Assistance  5: Supervision    Ambulation/Gait Assistance Details  PT demo, verbal, tactile cues on sequence (RW, RLE, RW, LLE...) with upright posture, prosthetic initial contact to extend prostheic knee & wt shift.    Ambulation Distance (Feet)  100 Feet 100' X 2    Assistive device  Rolling walker;Prosthesis    Ambulation Surface  Indoor;Level    Stairs  Yes    Stairs Assistance  5: Supervision    Stairs Assistance Details (indicate cue type and reason)  PT demo, verbal cues on technique with TFA prosthesis. Verbal & tactile cues while performing.    Stair Management Technique  Two rails;Step to pattern;Forwards    Number of Stairs  4    Ramp  4: Min assist RW & prosthesis    Ramp Details (indicate cue type and reason)  demo, verbal cues prior & verbal, manual cues during on technique with TFA prosthesis    Curb  4: Min assist Min guard, RW & prosthesis    Curb Details (indicate cue type and reason)  demo, verbal cues prior & verbal, manual cues during on technique with TFA prosthesis      Prosthetics   Current prosthetic wear tolerance (days/week)   Unable to wear after PT eval yesterday due to dtr's wedding.    Current prosthetic wear tolerance (#hours/day)   To initiate daily wear 2 hrs 2x/day    Residual limb condition   no open areas, normal color & temperature, no hair growth, cylinderical shape    Education Provided  Skin check;Residual limb care;Prosthetic cleaning;Correct ply sock adjustment;Proper Donning;Proper wear schedule/adjustment    Person(s) Educated  Patient    Education Method  Explanation;Demonstration;Tactile cues;Verbal cues    Education Method  Verbalized understanding;Returned demonstration;Tactile cues required;Verbal cues required;Needs further instruction                PT Short Term Goals - 11/10/17 1908      PT SHORT TERM GOAL #1   Title  Patient demonstrates proper donning of prosthesis including tightening suspension strap. (All STGs Target Date: 12/09/2017)    Time  1    Status  On-going    Target Date  12/09/17      PT SHORT TERM GOAL #2  Title  Patient tolerates wear of prosthesis >8hrs total/day with skin issues or limb pain.     Time  1    Period  Months    Status  On-going    Target Date  12/09/17      PT SHORT TERM GOAL #3   Title  Patient standing balance reaches 5" anteriorly without UE support and to ground with UE support with supervision.     Time  1    Period  Months    Status  On-going    Target Date  12/09/17      PT SHORT TERM GOAL #4   Title  Patient ambulates 300' with RW & prosthesis with supervision.     Time  1    Period  Months    Status  On-going    Target Date  12/09/17      PT SHORT TERM GOAL #5   Title  Patient negotiates stairs (2 rails), ramps & curbs with RW & prosthesis with supervision.     Time  1    Period  Months    Status  On-going    Target Date  12/09/17        PT Long Term Goals - 11/10/17 1909      PT LONG TERM GOAL #1   Title  Patient verbalizes & demonstrates proper prosthetic care to enable safe use of prosthesis. (All LTGs Target Date: 03/04/2018)    Time  4    Period  Months    Status  On-going    Target Date  03/04/18      PT LONG TERM GOAL #2   Title  Patient tolerates wear of prosthesis >90% of awake hours without skin issues or limb pain to enable function during her day.     Time  4    Period  Months    Status  On-going    Target Date  03/04/18      PT LONG TERM GOAL #3   Title  Patient ambulates household around furniture carrying items with cane or less & prosthesis modified independent.     Time  4    Period  Months    Status  On-going    Target Date  03/04/18      PT LONG TERM GOAL #4   Title  Berg Balance >/= 45/56 to indicate lower fall  risk.     Time  4    Period  Months    Status  On-going    Target Date  03/04/18      PT LONG TERM GOAL #5   Title  Patient ambulates >1000' with LRAD & prosthesis outdoors including grass, ramps & curbs modified independent.     Time  4    Period  Months    Status  On-going    Target Date  03/04/18      PT LONG TERM GOAL #6   Title  Patient performs standing ADLs including cooking, laundry, sweeping, etc with prosthesis & LRAD modified indepedent.     Time  4    Period  Months    Status  On-going    Target Date  03/04/18            Plan - 11/10/17 1910    Clinical Impression Statement  Patient has general understanding how to negotiate ramps, curbs with RW & stairs with 2 rails with TFA prosthesis but needs additional skilled instruction.     Rehab Potential  Good    Clinical Impairments Affecting Rehab Potential  right TFA, arthritis, CAD,A-Fib, PVD, CVA, Glaucoma, migraines, HTN, NSTEMI,  knee pain/left torn meniscus,     PT Frequency  2x / week    PT Duration  Other (comment) 4 months (17 weeks), will need recert at 2 months    PT Treatment/Interventions  ADLs/Self Care Home Management;DME Instruction;Gait training;Stair training;Functional mobility training;Therapeutic activities;Therapeutic exercise;Balance training;Neuromuscular re-education;Patient/family education;Prosthetic Training    PT Next Visit Plan  Review prosthetic care, instruct in HEP sink, prosthetic gait with RW, review stairs, curbs & ramps    Consulted and Agree with Plan of Care  Patient       Patient will benefit from skilled therapeutic intervention in order to improve the following deficits and impairments:  Abnormal gait, Decreased activity tolerance, Decreased balance, Decreased endurance, Decreased knowledge of use of DME, Decreased mobility, Decreased strength, Prosthetic Dependency, Postural dysfunction  Visit Diagnosis: Other abnormalities of gait and mobility  Unsteadiness on  feet  Muscle weakness (generalized)     Problem List Patient Active Problem List   Diagnosis Date Noted  . Phantom limb pain ([place])   . Hypertensive crisis   . Induration of skin   . Abnormality of gait   . Hypoalbuminemia due to protein-calorie malnutrition ([place])   . Amputee, above knee, right ([place]) 05/25/2017  . Unilateral AKA, right ([place])   . Elevated troponin   . PVD (peripheral vascular disease) ([place])   . Benign essential HTN   . Acute blood loss anemia   . Ischemia of right lower extremity 05/17/2017  . Thrombosis of right femoral artery ([place]) 01/09/2017  . Ischemia of extremity 01/09/2017  . History of colonic polyps 07/30/2016  . Essential hypertension 01/08/2016  . Polycythemia vera ([place]) 08/20/2015  . Leukocytosis 07/15/2015  . S/P arterial stent right SFA overlapping Bahn covered stents 06/27/2015 06/28/2015  . Claudication ([place]) 06/27/2015  . Carotid artery disease ([place]) 08/24/2014  . Osteoarthritis of left knee 03/27/2014  . External hemorrhoids 06/06/2013  . PAF (paroxysmal atrial fibrillation), maintaing SR 05/11/2013  . Chronic anticoagulation, new- eliquis 05/11/2013  . CAD (coronary artery disease), per cath 04/27/13, non obstructive disease 20-30% LM; LAD 30%; 50-60% OM2; RCA 40%; EF 65-70% 05/11/2013  . Mitral regurgitation,2+ by cath 05/11/2013  . Atrial fibrillation with RVR, hx of PAF previously 04/27/2013  . PAD (peripheral artery disease), Lt carotid 50-70%: , Hx stent Lt exteral iliac & RSFA stent, known occl. LSFA  04/27/2013  . Hyperlipidemia 04/27/2013  . History of CVA (cerebrovascular accident) 04/27/2013  . NSTEMI (non-ST elevated myocardial infarction) ([place]) 04/27/2013    Zymir Napoli PT, DPT 11/11/2017, 9:12 AM  [facility] [address] [place], [place], [address] Phone: [phone]   Fax:  [phone]  Name: [name] MRN: [id number] Date of Birth: April 06, 1951

## 2017-11-16 ENCOUNTER — Encounter: Payer: Self-pay | Admitting: Physical Therapy

## 2017-11-16 ENCOUNTER — Ambulatory Visit: Payer: Medicare Other | Admitting: Physical Therapy

## 2017-11-16 DIAGNOSIS — R2681 Unsteadiness on feet: Secondary | ICD-10-CM

## 2017-11-16 DIAGNOSIS — R2689 Other abnormalities of gait and mobility: Secondary | ICD-10-CM | POA: Diagnosis not present

## 2017-11-16 DIAGNOSIS — M6281 Muscle weakness (generalized): Secondary | ICD-10-CM

## 2017-11-16 NOTE — Therapy (Signed)
[facility] [address] [place], [place], [address] Phone: [phone]   Fax:  [phone]  Physical Therapy Treatment  Patient Details  Name: [name] MRN: [id number] Date of Birth: 1951-08-20 Referring Provider: Jamse Arn, MD   Encounter Date: 11/16/2017  PT End of Session - 11/16/17 1205    Visit Number  3    Number of Visits  34    Date for PT Re-Evaluation  01/07/18    Authorization Type  Medicare G-code & progress note every 10th visit    PT Start Time  0930    PT Stop Time  1015    PT Time Calculation (min)  45 min    Equipment Utilized During Treatment  Gait belt    Activity Tolerance  Patient tolerated treatment well    Behavior During Therapy  [facility] for tasks assessed/performed       Past Medical History:  Diagnosis Date  . Arthritis    "fingers, some in my knees" (01/28/2016)  . CAD (coronary artery disease), per cath 04/27/13, non obstructive disease, EF 65-70% 05/11/2013  . Carotid disease, bilateral ([place]) 09/2013   Lt carotid 50-69% stentosis, less on rt  . Dysrhythmia   . Fatty liver   . GERD (gastroesophageal reflux disease)   . Glaucoma   . H/O cardiovascular stress test 12/27/2009   negative for ischemia  . Headache    "occasional since eye pressure regulated" (01/28/2016)  . Hyperlipidemia   . Hypertension   . Leukocytosis 07/15/2015  . Migraine    "stopped w/laser holes to relieve pressure in my eyes; had them 3-4 times/wk before taht" (01/28/2016)  . Mitral regurgitation,2+ by cath 05/11/2013  . NSTEMI (non-ST elevated myocardial infarction) ([place]) 04/27/2013  . Pain    BOTH KNEES - PT HAS TORN MENISCUS LEFT KNEE  . Paroxysmal atrial fibrillation (HCC)    hx of a fib on ASA  AND ELIQUIS   . Polycythemia vera ([place]) 08/20/2015   JAK-2 positive 07/19/15  . PVD (peripheral vascular disease) with claudication ([place]) 07/2006   previous L ext iliac stent and rt SFA stent, known occluded Lt SFA   . S/P cardiac cath 04/27/13   NON OBSTRUCTIVE DISEASE, 2+ mr, ef 65-70%  . Statin intolerance   . Stroke ([place]) 2006   SWELLING ALL OVER AND RT SIDE OF FACE DRAWN AND SPEECH SLURRED AND NUMBNESS ON RIGHT SIDE-- ALL RESOLVED    Past Surgical History:  Procedure Laterality Date  . AMPUTATION Right 05/21/2017   Procedure: AMPUTATION ABOVE KNEE;  Surgeon: Rosetta Posner, MD;  Location: [facility];  Service: Vascular;  Laterality: Right;  . CARDIAC CATHETERIZATION  04/27/13   non occlusive disease with mild to mod. calcified lesions in ostial LM and proximal LAD and moderate prox. RC AND DISTAL AV GROOVE LCX, ef  . CATARACT EXTRACTION W/PHACO Right 03/21/2015   Procedure: CATARACT EXTRACTION PHACO AND INTRAOCULAR LENS PLACEMENT RIGHT EYE;  Surgeon: Baruch Goldmann, MD;  Location: [facility];  Service: Ophthalmology;  Laterality: Right;  CDE:5.10  . CATARACT EXTRACTION W/PHACO Left 05/16/2015   Procedure: CATARACT EXTRACTION PHACO AND INTRAOCULAR LENS PLACEMENT (IOC);  Surgeon: Baruch Goldmann, MD;  Location: [facility];  Service: Ophthalmology;  Laterality: Left;  CDE 5.57  . [place]  . COLONOSCOPY N/A 09/11/2016   Procedure: COLONOSCOPY;  Surgeon: Rogene Houston, MD;  [facility];  Service: Endoscopy;  Laterality: N/A;  730-moved to 1:00 Ann notified pt  .  EMBOLECTOMY Right 02/01/2016   Procedure: Thrombectomy of Right Femoral-Popliteal Bypass Graft; Endarterectomy of Right Below Knee Popliteal Artery and Tibial Peroneal Trunk with Bovine Pericardium Patch Angioplasty.;  Surgeon: Angelia Mould, MD;  Location: [facility];  Service: Vascular;  Laterality: Right;  . FEMORAL-POPLITEAL BYPASS GRAFT Right 01/31/2016   Procedure: BYPASS GRAFT RIGHT COMMON  FEMORALTO BELOW KNEE POPLITEAL ARTERY BYPASS GRAFT USING 6MM PROPATEN GORTEX GRAFT;  Surgeon: Mal Misty, MD;  Location: [facility];  Service: Vascular;  Laterality: Right;  . FEMORAL-POPLITEAL BYPASS GRAFT Right 01/09/2017   Procedure: THROMBECTOMY  OF RIGHT FEMORAL-POPLITEAL  ARTERY BYPASS GRAFT; THROMBECTOMY RIGHT  TIBIAL VESSELS;  Surgeon: [name], MD;  Location: [facility];  Service: Vascular;  Laterality: Right;  . FEMORAL-POPLITEAL BYPASS GRAFT Right 05/17/2017   Procedure: RIGHT FEMORAL-TIBIAL PERONEAL TRUNK ARTERY BYPASS GRAFT USING 54mX80cm PROPATEN GRAFT WITH REMOVABLE RING;  Surgeon: ERosetta Posner MD;  [facility]  Service: Vascular;  Laterality: Right;  . FRACTURE SURGERY    . ILIAC ARTERY STENT  07/2006   external iliac and Rt SFA stent 07/2006 AND THE LEFT WAS IN 2006  . KNEE ARTHROSCOPY WITH MEDIAL MENISECTOMY Left 03/27/2014   Procedure: left knee arthorscopy with medial chondraplasty of the medial femoral and patella, medial microfracture technique of medial femoral condyl;  Surgeon: RTobi Bastos MD;  [facility];  Service: Orthopedics;  Laterality: Left;  . LEFT HEART CATHETERIZATION WITH CORONARY ANGIOGRAM Right 04/27/2013   Procedure: LEFT HEART CATHETERIZATION WITH CORONARY ANGIOGRAM;  Surgeon: DLeonie Man MD;  [facility];  Service: Cardiovascular;  Laterality: Right;  . LOWER EXTREMITY ANGIOGRAM Right 01/31/2016   Procedure: INTRAOP RIGHT LOWER EXTREMITY ANGIOGRAM;  Surgeon: JMal Misty MD;  [facility]  Service: Vascular;  Laterality: Right;  . ORIF WRIST FRACTURE Right 1983  . OVARIAN CYST REMOVAL Right   . PATCH ANGIOPLASTY Right 01/31/2016   Procedure: RIGHT COMMON FEMORAL AND PROFUNDA FEMORIS ENDARECTOMY WITH PATCH ANGIOPLASTY;  Surgeon: JMal Misty MD;  [facility]  Service: Vascular;  Laterality: Right;  . PATCH ANGIOPLASTY Right 01/09/2017   Procedure: PATCH ANGIOPLASTY RIGHT POPLITEAL ARTERY BYPASS GRAFT;  Surgeon: CElam Dutch MD;  [facility]  Service: Vascular;  Laterality: Right;  . PERIPHERAL VASCULAR CATHETERIZATION N/A 06/27/2015   Procedure: Lower Extremity Angiography;  Surgeon: JLorretta Harp MD;  [facility];  Service:  Cardiovascular;  Laterality: N/A;  . PERIPHERAL VASCULAR CATHETERIZATION Bilateral 01/29/2016   Procedure: Lower Extremity Angiography;  Surgeon: MWellington Hampshire MD;  [facility];  Service: Cardiovascular;  Laterality: Bilateral;  . PERIPHERAL VASCULAR CATHETERIZATION N/A 01/29/2016   Procedure: Abdominal Aortogram;  Surgeon: MWellington Hampshire MD;  [facility];  Service: Cardiovascular;  Laterality: N/A;  . REFRACTIVE SURGERY Bilateral    "6 in one eye; 7 in the other; to relieve pressure; not glaucoma" (01/28/2016)  . SFA Right 06/27/2015   overlapping Bahn covered stents  . TUBAL LIGATION  1983  . VEIN HARVEST Left 05/17/2017   Procedure: LEFT LEG GREATER SAPHENOUS VEIN HARVEST;  Surgeon: ERosetta Posner MD;  [facility]  Service: Vascular;  Laterality: Left;  .Marland KitchenVEIN REPAIR Right 01/31/2016   Procedure: RIGHT GREATER SAPHENOUS VEIN EXAMNED BUT NOT REMOVED;  Surgeon: JMal Misty MD;  [facility]  Service: Vascular;  Laterality: Right;    There were no vitals filed for this visit.  Subjective Assessment -  11/16/17 0930    Subjective  She has worn prosthesis 2 hrs 2x/day 4 of last 6 days. Her distal abdomen continues to be bruised by prosthesis. (PT recommended setting up appt with prosthetist)    Pertinent History  right TFA, arthritis, CAD,A-Fib, PVD, CVA, Glaucoma, migraines, HTN, NSTEMI,  knee pain/left torn meniscus,     Limitations  Lifting;Standing;Walking    Patient Stated Goals  To use prosthesis walking. Previously 2-4 miles.     Currently in Pain?  No/denies                      [facility] Adult PT Treatment/Exercise - 11/16/17 0930      Transfers   Transfers  Sit to Stand;Stand to Sit    Sit to Stand  5: Supervision;With upper extremity assist;With armrests;From chair/3-in-1;4: Min assist Supervision from chairs w/armrests & MinA chair w/o armrests    Sit to Stand Details  Tactile cues for weight shifting;Verbal cues for  technique    Stand to Sit  5: Supervision;4: Min guard;With upper extremity assist;With armrests;To chair/3-in-1 from [place], [place] guard chairs w/o armrests    Stand to Sit Details (indicate cue type and reason)  Verbal cues for technique      Ambulation/Gait   Ambulation/Gait  Yes    Ambulation/Gait Assistance  5: Supervision    Ambulation/Gait Assistance Details  verbal cues on wt shift over prosthesis in stance    Ambulation Distance (Feet)  100 Feet 100' X 2    Assistive device  Rolling walker;Prosthesis    Ambulation Surface  Indoor;Level    Stairs  Yes    Stairs Assistance  5: Supervision    Stairs Assistance Details (indicate cue type and reason)  verbal cues on wt shift over prosthesis in stance    Stair Management Technique  Two rails;Step to pattern;Forwards    Number of Stairs  4    Ramp  4: Min assist RW & prosthesis    Ramp Details (indicate cue type and reason)  verbal cues on posture & wt shift    Curb  4: Min assist Min guard, RW & prosthesis    Curb Details (indicate cue type and reason)  verbal cues on technique      Prosthetics   Prosthetic Care Comments   PT reviewed rationale to increasing wear to most of awake hours. Recommended wearing prosthesis even when sick if out of bed.     Current prosthetic wear tolerance (days/week)   4 of 6 days since last PT appt    Current prosthetic wear tolerance (#hours/day)   2 hrs 2x/day PT recommended to increase to 3 hrs 2x/day    Residual limb condition   reports bruising at lower abdomen    Education Provided  Skin check;Residual limb care;Prosthetic cleaning;Correct ply sock adjustment;Proper Donning;Proper wear schedule/adjustment    Person(s) Educated  Patient    Education Method  Explanation;Verbal cues    Education Method  Verbalized understanding;Verbal cues required;Needs further instruction             PT Education - 11/16/17 1204    Education provided  Yes    Education Details  initiated standing HEP at sink  with midline static stance, lateral & ant/post weight shifts.    Person(s) Educated  Patient    Methods  Explanation;Demonstration;Tactile cues;Verbal cues    Comprehension  Verbalized understanding;Returned demonstration;Verbal cues required;Tactile cues required;Need further instruction       PT Short Term Goals -  11/10/17 1908      PT SHORT TERM GOAL #1   Title  Patient demonstrates proper donning of prosthesis including tightening suspension strap. (All STGs Target Date: 12/09/2017)    Time  1    Status  On-going    Target Date  12/09/17      PT SHORT TERM GOAL #2   Title  Patient tolerates wear of prosthesis >8hrs total/day with skin issues or limb pain.     Time  1    Period  Months    Status  On-going    Target Date  12/09/17      PT SHORT TERM GOAL #3   Title  Patient standing balance reaches 5" anteriorly without UE support and to ground with UE support with supervision.     Time  1    Period  Months    Status  On-going    Target Date  12/09/17      PT SHORT TERM GOAL #4   Title  Patient ambulates 300' with RW & prosthesis with supervision.     Time  1    Period  Months    Status  On-going    Target Date  12/09/17      PT SHORT TERM GOAL #5   Title  Patient negotiates stairs (2 rails), ramps & curbs with RW & prosthesis with supervision.     Time  1    Period  Months    Status  On-going    Target Date  12/09/17        PT Long Term Goals - 11/10/17 1909      PT LONG TERM GOAL #1   Title  Patient verbalizes & demonstrates proper prosthetic care to enable safe use of prosthesis. (All LTGs Target Date: 03/04/2018)    Time  4    Period  Months    Status  On-going    Target Date  03/04/18      PT LONG TERM GOAL #2   Title  Patient tolerates wear of prosthesis >90% of awake hours without skin issues or limb pain to enable function during her day.     Time  4    Period  Months    Status  On-going    Target Date  03/04/18      PT LONG TERM GOAL #3   Title   Patient ambulates household around furniture carrying items with cane or less & prosthesis modified independent.     Time  4    Period  Months    Status  On-going    Target Date  03/04/18      PT LONG TERM GOAL #4   Title  Berg Balance >/= 45/56 to indicate lower fall risk.     Time  4    Period  Months    Status  On-going    Target Date  03/04/18      PT LONG TERM GOAL #5   Title  Patient ambulates >1000' with LRAD & prosthesis outdoors including grass, ramps & curbs modified independent.     Time  4    Period  Months    Status  On-going    Target Date  03/04/18      PT LONG TERM GOAL #6   Title  Patient performs standing ADLs including cooking, laundry, sweeping, etc with prosthesis & LRAD modified indepedent.     Time  4    Period  Months  Status  On-going    Target Date  03/04/18            Plan - 11/16/17 1206    Clinical Impression Statement  patient improved understanding of negotiating stairs, ramps & curbs with RW & TFA prosthesis. She has basic understanding of standing balance HEP instructed thus far.     Rehab Potential  Good    Clinical Impairments Affecting Rehab Potential  right TFA, arthritis, CAD,A-Fib, PVD, CVA, Glaucoma, migraines, HTN, NSTEMI,  knee pain/left torn meniscus,     PT Frequency  2x / week    PT Duration  Other (comment) 4 months (17 weeks), will need recert at 2 months    PT Treatment/Interventions  ADLs/Self Care Home Management;DME Instruction;Gait training;Stair training;Functional mobility training;Therapeutic activities;Therapeutic exercise;Balance training;Neuromuscular re-education;Patient/family education;Prosthetic Training    PT Next Visit Plan  Review prosthetic care, review & add additional exercises to HEP sink, prosthetic gait with RW, review stairs, curbs & ramps    Consulted and Agree with Plan of Care  Patient       Patient will benefit from skilled therapeutic intervention in order to improve the following deficits  and impairments:  Abnormal gait, Decreased activity tolerance, Decreased balance, Decreased endurance, Decreased knowledge of use of DME, Decreased mobility, Decreased strength, Prosthetic Dependency, Postural dysfunction  Visit Diagnosis: Other abnormalities of gait and mobility  Unsteadiness on feet  Muscle weakness (generalized)     Problem List Patient Active Problem List   Diagnosis Date Noted  . Phantom limb pain ([place])   . Hypertensive crisis   . Induration of skin   . Abnormality of gait   . Hypoalbuminemia due to protein-calorie malnutrition ([place])   . Amputee, above knee, right ([place]) 05/25/2017  . Unilateral AKA, right ([place])   . Elevated troponin   . PVD (peripheral vascular disease) ([place])   . Benign essential HTN   . Acute blood loss anemia   . Ischemia of right lower extremity 05/17/2017  . Thrombosis of right femoral artery ([place]) 01/09/2017  . Ischemia of extremity 01/09/2017  . History of colonic polyps 07/30/2016  . Essential hypertension 01/08/2016  . Polycythemia vera ([place]) 08/20/2015  . Leukocytosis 07/15/2015  . S/P arterial stent right SFA overlapping Bahn covered stents 06/27/2015 06/28/2015  . Claudication ([place]) 06/27/2015  . Carotid artery disease ([place]) 08/24/2014  . Osteoarthritis of left knee 03/27/2014  . External hemorrhoids 06/06/2013  . PAF (paroxysmal atrial fibrillation), maintaing SR 05/11/2013  . Chronic anticoagulation, new- eliquis 05/11/2013  . CAD (coronary artery disease), per cath 04/27/13, non obstructive disease 20-30% LM; LAD 30%; 50-60% OM2; RCA 40%; EF 65-70% 05/11/2013  . Mitral regurgitation,2+ by cath 05/11/2013  . Atrial fibrillation with RVR, hx of PAF previously 04/27/2013  . PAD (peripheral artery disease), Lt carotid 50-70%: , Hx stent Lt exteral iliac & RSFA stent, known occl. LSFA  04/27/2013  . Hyperlipidemia 04/27/2013  . History of CVA (cerebrovascular accident) 04/27/2013  . NSTEMI (non-ST elevated myocardial  infarction) ([place]) 04/27/2013    Raychelle Hudman PT, DPT 11/16/2017, 12:09 PM  [facility] [address] [place], [place], [address] Phone: [phone]   Fax:  [phone]  Name: ANIKO FINNIGAN MRN: [id number] Date of Birth: August 06, 1951

## 2017-11-18 ENCOUNTER — Encounter: Payer: Self-pay | Admitting: Physical Therapy

## 2017-11-18 ENCOUNTER — Ambulatory Visit: Payer: Medicare Other | Admitting: Physical Therapy

## 2017-11-18 DIAGNOSIS — R2681 Unsteadiness on feet: Secondary | ICD-10-CM

## 2017-11-18 DIAGNOSIS — M6281 Muscle weakness (generalized): Secondary | ICD-10-CM

## 2017-11-18 DIAGNOSIS — R2689 Other abnormalities of gait and mobility: Secondary | ICD-10-CM | POA: Diagnosis not present

## 2017-11-18 NOTE — Patient Instructions (Addendum)
Do each exercise 1-2  times per day Do each exercise 10 repetitions Hold each exercise for 2 seconds to feel your location  AT [place].  USE TAPE ON FLOOR TO MARK THE MIDLINE POSITION. You also should try to feel with your limb pressure in socket.  You are trying to feel with limb what you used to feel with the bottom of your foot.  1. Side to Side Shift: Moving your hips only (not shoulders): move weight onto your left leg, HOLD/FEEL.  Move back to equal weight on each leg, HOLD/FEEL. Move weight onto your right leg, HOLD/FEEL. Move back to equal weight on each leg, HOLD/FEEL. Repeat. 2. Front to Back Shift: Moving your hips only (not shoulders): move your weight forward onto your toes, HOLD/FEEL. Move your weight back to equal Flat Foot on both legs, HOLD/FEEL. Move your weight back onto your heels, HOLD/FEEL. Move your weight back to equal on both legs, HOLD/FEEL. Repeat. 3. Moving Cones / Cups: With equal weight on each leg: Hold on with one hand the first time, then progress to no hand supports. Move cups from one side of sink to the other. Place cups ~2" out of your reach, progress to 10" beyond reach. 4. Overhead/Upward Reaching: alternated reaching up to top cabinets or ceiling if no cabinets present. Keep equal weight on each leg. Start with one hand support on counter while other hand reaches and progress to no hand support with reaching. 5.   Looking Over Shoulders: With equal weight on each leg: alternate turning to look   over your shoulders with one hand support on counter as needed. Shift weight to side looking, pull hip then shoulder then head/eyes around to look behind you. Start with one hand support & progress to no hand support. 5.   Stepping Under Sink with left leg:  Shift hips / weight on prosthesis, keep right hip muscles tight. SLOWLY step left foot into lower cabinet. Reset hip muscles & step back to floor.

## 2017-11-18 NOTE — Therapy (Signed)
[facility] [address] [place], [place], [address] Phone: [phone]   Fax:  [phone]  Physical Therapy Treatment  Patient Details  Name: [name] MRN: [id number] Date of Birth: 1950-12-22 Referring Provider: Jamse Arn, MD   Encounter Date: 11/18/2017  PT End of Session - 11/18/17 0952    Visit Number  4    Number of Visits  34    Date for PT Re-Evaluation  01/07/18    Authorization Type  Medicare G-code & progress note every 10th visit    PT Start Time  0845    PT Stop Time  0930    PT Time Calculation (min)  45 min    Equipment Utilized During Treatment  Gait belt    Activity Tolerance  Patient tolerated treatment well    Behavior During Therapy  [facility] for tasks assessed/performed       Past Medical History:  Diagnosis Date  . Arthritis    "fingers, some in my knees" (01/28/2016)  . CAD (coronary artery disease), per cath 04/27/13, non obstructive disease, EF 65-70% 05/11/2013  . Carotid disease, bilateral ([place]) 09/2013   Lt carotid 50-69% stentosis, less on rt  . Dysrhythmia   . Fatty liver   . GERD (gastroesophageal reflux disease)   . Glaucoma   . H/O cardiovascular stress test 12/27/2009   negative for ischemia  . Headache    "occasional since eye pressure regulated" (01/28/2016)  . Hyperlipidemia   . Hypertension   . Leukocytosis 07/15/2015  . Migraine    "stopped w/laser holes to relieve pressure in my eyes; had them 3-4 times/wk before taht" (01/28/2016)  . Mitral regurgitation,2+ by cath 05/11/2013  . NSTEMI (non-ST elevated myocardial infarction) ([place]) 04/27/2013  . Pain    BOTH KNEES - PT HAS TORN MENISCUS LEFT KNEE  . Paroxysmal atrial fibrillation (HCC)    hx of a fib on ASA  AND ELIQUIS   . Polycythemia vera ([place]) 08/20/2015   JAK-2 positive 07/19/15  . PVD (peripheral vascular disease) with claudication ([place]) 07/2006   previous L ext iliac stent and rt SFA stent, known occluded Lt SFA   . S/P cardiac cath 04/27/13   NON OBSTRUCTIVE DISEASE, 2+ mr, ef 65-70%  . Statin intolerance   . Stroke ([place]) 2006   SWELLING ALL OVER AND RT SIDE OF FACE DRAWN AND SPEECH SLURRED AND NUMBNESS ON RIGHT SIDE-- ALL RESOLVED    Past Surgical History:  Procedure Laterality Date  . AMPUTATION Right 05/21/2017   Procedure: AMPUTATION ABOVE KNEE;  Surgeon: Rosetta Posner, MD;  Location: [facility];  Service: Vascular;  Laterality: Right;  . CARDIAC CATHETERIZATION  04/27/13   non occlusive disease with mild to mod. calcified lesions in ostial LM and proximal LAD and moderate prox. RC AND DISTAL AV GROOVE LCX, ef  . CATARACT EXTRACTION W/PHACO Right 03/21/2015   Procedure: CATARACT EXTRACTION PHACO AND INTRAOCULAR LENS PLACEMENT RIGHT EYE;  Surgeon: Baruch Goldmann, MD;  [facility];  Service: Ophthalmology;  Laterality: Right;  CDE:5.10  . CATARACT EXTRACTION W/PHACO Left 05/16/2015   Procedure: CATARACT EXTRACTION PHACO AND INTRAOCULAR LENS PLACEMENT (IOC);  Surgeon: Baruch Goldmann, MD;  Location: [facility];  Service: Ophthalmology;  Laterality: Left;  CDE 5.57  . [place]  . COLONOSCOPY N/A 09/11/2016   Procedure: COLONOSCOPY;  Surgeon: Rogene Houston, MD;  [facility];  Service: Endoscopy;  Laterality: N/A;  730-moved to 1:00 Ann notified pt  .  EMBOLECTOMY Right 02/01/2016   Procedure: Thrombectomy of Right Femoral-Popliteal Bypass Graft; Endarterectomy of Right Below Knee Popliteal Artery and Tibial Peroneal Trunk with Bovine Pericardium Patch Angioplasty.;  Surgeon: Angelia Mould, MD;  Location: [facility];  Service: Vascular;  Laterality: Right;  . FEMORAL-POPLITEAL BYPASS GRAFT Right 01/31/2016   Procedure: BYPASS GRAFT RIGHT COMMON  FEMORALTO BELOW KNEE POPLITEAL ARTERY BYPASS GRAFT USING 6MM PROPATEN GORTEX GRAFT;  Surgeon: Mal Misty, MD;  Location: [facility];  Service: Vascular;  Laterality: Right;  . FEMORAL-POPLITEAL BYPASS GRAFT Right 01/09/2017   Procedure: THROMBECTOMY  OF RIGHT FEMORAL-POPLITEAL  ARTERY BYPASS GRAFT; THROMBECTOMY RIGHT  TIBIAL VESSELS;  Surgeon: [name], MD;  Location: [facility];  Service: Vascular;  Laterality: Right;  . FEMORAL-POPLITEAL BYPASS GRAFT Right 05/17/2017   Procedure: RIGHT FEMORAL-TIBIAL PERONEAL TRUNK ARTERY BYPASS GRAFT USING 54mX80cm PROPATEN GRAFT WITH REMOVABLE RING;  Surgeon: ERosetta Posner MD;  [facility]  Service: Vascular;  Laterality: Right;  . FRACTURE SURGERY    . ILIAC ARTERY STENT  07/2006   external iliac and Rt SFA stent 07/2006 AND THE LEFT WAS IN 2006  . KNEE ARTHROSCOPY WITH MEDIAL MENISECTOMY Left 03/27/2014   Procedure: left knee arthorscopy with medial chondraplasty of the medial femoral and patella, medial microfracture technique of medial femoral condyl;  Surgeon: RTobi Bastos MD;  [facility];  Service: Orthopedics;  Laterality: Left;  . LEFT HEART CATHETERIZATION WITH CORONARY ANGIOGRAM Right 04/27/2013   Procedure: LEFT HEART CATHETERIZATION WITH CORONARY ANGIOGRAM;  Surgeon: DLeonie Man MD;  [facility];  Service: Cardiovascular;  Laterality: Right;  . LOWER EXTREMITY ANGIOGRAM Right 01/31/2016   Procedure: INTRAOP RIGHT LOWER EXTREMITY ANGIOGRAM;  Surgeon: JMal Misty MD;  [facility]  Service: Vascular;  Laterality: Right;  . ORIF WRIST FRACTURE Right 1983  . OVARIAN CYST REMOVAL Right   . PATCH ANGIOPLASTY Right 01/31/2016   Procedure: RIGHT COMMON FEMORAL AND PROFUNDA FEMORIS ENDARECTOMY WITH PATCH ANGIOPLASTY;  Surgeon: JMal Misty MD;  [facility]  Service: Vascular;  Laterality: Right;  . PATCH ANGIOPLASTY Right 01/09/2017   Procedure: PATCH ANGIOPLASTY RIGHT POPLITEAL ARTERY BYPASS GRAFT;  Surgeon: CElam Dutch MD;  [facility]  Service: Vascular;  Laterality: Right;  . PERIPHERAL VASCULAR CATHETERIZATION N/A 06/27/2015   Procedure: Lower Extremity Angiography;  Surgeon: JLorretta Harp MD;  [facility];  Service:  Cardiovascular;  Laterality: N/A;  . PERIPHERAL VASCULAR CATHETERIZATION Bilateral 01/29/2016   Procedure: Lower Extremity Angiography;  Surgeon: MWellington Hampshire MD;  [facility];  Service: Cardiovascular;  Laterality: Bilateral;  . PERIPHERAL VASCULAR CATHETERIZATION N/A 01/29/2016   Procedure: Abdominal Aortogram;  Surgeon: MWellington Hampshire MD;  [facility];  Service: Cardiovascular;  Laterality: N/A;  . REFRACTIVE SURGERY Bilateral    "6 in one eye; 7 in the other; to relieve pressure; not glaucoma" (01/28/2016)  . SFA Right 06/27/2015   overlapping Bahn covered stents  . TUBAL LIGATION  1983  . VEIN HARVEST Left 05/17/2017   Procedure: LEFT LEG GREATER SAPHENOUS VEIN HARVEST;  Surgeon: ERosetta Posner MD;  [facility]  Service: Vascular;  Laterality: Left;  .[place] KitchenVEIN REPAIR Right 01/31/2016   Procedure: RIGHT GREATER SAPHENOUS VEIN EXAMNED BUT NOT REMOVED;  Surgeon: JMal Misty MD;  [facility]  Service: Vascular;  Laterality: Right;    There were no vitals filed for this visit.  Subjective Assessment -  11/18/17 0845    Subjective  She is wearing prosthesis 3hrs 2x/day daily with some bruising at abdomen. She has appointment on Tuesday, Dec 4. with prosthetist.       Pertinent History  right TFA, arthritis, CAD,A-Fib, PVD, CVA, Glaucoma, migraines, HTN, NSTEMI,  knee pain/left torn meniscus,     Limitations  Lifting;Standing;Walking    Patient Stated Goals  To use prosthesis walking. Previously 2-4 miles.     Currently in Pain?  No/denies                      [facility] Adult PT Treatment/Exercise - 11/18/17 0845      Transfers   Transfers  Sit to Stand;Stand to Sit    Sit to Stand  5: Supervision;With upper extremity assist;With armrests;From chair/3-in-1 Supervision from chairs w/armrests    Sit to Stand Details  Tactile cues for weight shifting;Verbal cues for technique    Stand to Sit  5: Supervision;With upper extremity  assist;With armrests;To chair/3-in-1 from RW,     Stand to Sit Details (indicate cue type and reason)  Verbal cues for technique      Ambulation/Gait   Ambulation/Gait  Yes    Ambulation/Gait Assistance  5: Supervision    Ambulation/Gait Assistance Details  verbal cues on posture & step length    Ambulation Distance (Feet)  100 Feet 100'    Assistive device  Rolling walker;Prosthesis    Ambulation Surface  Indoor;Level    Stairs  Yes    Stairs Assistance  5: Supervision    Stair Management Technique  Two rails;Step to pattern;Forwards    Number of Stairs  4    Ramp  --    Curb  --      Prosthetics   Prosthetic Care Comments   Increase wear up to 4hrs 2x/day depending on abdominal bruising. She has appt with prosthetist on Tuesday in 5 days.     Current prosthetic wear tolerance (days/week)   daily    Current prosthetic wear tolerance (#hours/day)   3 hrs 2x/day    Residual limb condition   reports bruising at lower abdomen    Education Provided  Skin check;Residual limb care;Prosthetic cleaning;Correct ply sock adjustment;Proper Donning;Proper wear schedule/adjustment    Person(s) Educated  Patient    Education Method  Explanation;Demonstration;Verbal cues    Education Method  Verbalized understanding;Verbal cues required;Needs further instruction             PT Education - 11/18/17 0928    Education provided  Yes    Education Details  seated 4-pt gait sequence,  standing midline HEP at sink    Person(s) Educated  Patient    Methods  Explanation;Demonstration;Tactile cues;Verbal cues;Handout    Comprehension  Verbalized understanding;Returned demonstration;Verbal cues required;Tactile cues required       PT Short Term Goals - 11/10/17 1908      PT SHORT TERM GOAL #1   Title  Patient demonstrates proper donning of prosthesis including tightening suspension strap. (All STGs Target Date: 12/09/2017)    Time  1    Status  On-going    Target Date  12/09/17      PT SHORT  TERM GOAL #2   Title  Patient tolerates wear of prosthesis >8hrs total/day with skin issues or limb pain.     Time  1    Period  Months    Status  On-going    Target Date  12/09/17      PT  SHORT TERM GOAL #3   Title  Patient standing balance reaches 5" anteriorly without UE support and to ground with UE support with supervision.     Time  1    Period  Months    Status  On-going    Target Date  12/09/17      PT SHORT TERM GOAL #4   Title  Patient ambulates 300' with RW & prosthesis with supervision.     Time  1    Period  Months    Status  On-going    Target Date  12/09/17      PT SHORT TERM GOAL #5   Title  Patient negotiates stairs (2 rails), ramps & curbs with RW & prosthesis with supervision.     Time  1    Period  Months    Status  On-going    Target Date  12/09/17        PT Long Term Goals - 11/10/17 1909      PT LONG TERM GOAL #1   Title  Patient verbalizes & demonstrates proper prosthetic care to enable safe use of prosthesis. (All LTGs Target Date: 03/04/2018)    Time  4    Period  Months    Status  On-going    Target Date  03/04/18      PT LONG TERM GOAL #2   Title  Patient tolerates wear of prosthesis >90% of awake hours without skin issues or limb pain to enable function during her day.     Time  4    Period  Months    Status  On-going    Target Date  03/04/18      PT LONG TERM GOAL #3   Title  Patient ambulates household around furniture carrying items with cane or less & prosthesis modified independent.     Time  4    Period  Months    Status  On-going    Target Date  03/04/18      PT LONG TERM GOAL #4   Title  Berg Balance >/= 45/56 to indicate lower fall risk.     Time  4    Period  Months    Status  On-going    Target Date  03/04/18      PT LONG TERM GOAL #5   Title  Patient ambulates >1000' with LRAD & prosthesis outdoors including grass, ramps & curbs modified independent.     Time  4    Period  Months    Status  On-going    Target  Date  03/04/18      PT LONG TERM GOAL #6   Title  Patient performs standing ADLs including cooking, laundry, sweeping, etc with prosthesis & LRAD modified indepedent.     Time  4    Period  Months    Status  On-going    Target Date  03/04/18            Plan - 11/18/17 8546    Clinical Impression Statement  Patient appears to need bilateral UE support for gait but could advance to crutches with PT training. She understands exercise to work on 4 point sequencing while seated and PT planning to initiate crutch gait next session.     Rehab Potential  Good    Clinical Impairments Affecting Rehab Potential  right TFA, arthritis, CAD,A-Fib, PVD, CVA, Glaucoma, migraines, HTN, NSTEMI,  knee pain/left torn meniscus,     PT Frequency  2x /  week    PT Duration  Other (comment) 4 months (17 weeks), will need recert at 2 months    PT Treatment/Interventions  ADLs/Self Care Home Management;DME Instruction;Gait training;Stair training;Functional mobility training;Therapeutic activities;Therapeutic exercise;Balance training;Neuromuscular re-education;Patient/family education;Prosthetic Training    PT Next Visit Plan  Review prosthetic care, initiate prosthetic gait with crutches    Consulted and Agree with Plan of Care  Patient       Patient will benefit from skilled therapeutic intervention in order to improve the following deficits and impairments:  Abnormal gait, Decreased activity tolerance, Decreased balance, Decreased endurance, Decreased knowledge of use of DME, Decreased mobility, Decreased strength, Prosthetic Dependency, Postural dysfunction  Visit Diagnosis: Other abnormalities of gait and mobility  Unsteadiness on feet  Muscle weakness (generalized)     Problem List Patient Active Problem List   Diagnosis Date Noted  . Phantom limb pain ([place])   . Hypertensive crisis   . Induration of skin   . Abnormality of gait   . Hypoalbuminemia due to protein-calorie malnutrition ([place])    . Amputee, above knee, right ([place]) 05/25/2017  . Unilateral AKA, right ([place])   . Elevated troponin   . PVD (peripheral vascular disease) ([place])   . Benign essential HTN   . Acute blood loss anemia   . Ischemia of right lower extremity 05/17/2017  . Thrombosis of right femoral artery ([place]) 01/09/2017  . Ischemia of extremity 01/09/2017  . History of colonic polyps 07/30/2016  . Essential hypertension 01/08/2016  . Polycythemia vera ([place]) 08/20/2015  . Leukocytosis 07/15/2015  . S/P arterial stent right SFA overlapping Bahn covered stents 06/27/2015 06/28/2015  . Claudication ([place]) 06/27/2015  . Carotid artery disease ([place]) 08/24/2014  . Osteoarthritis of left knee 03/27/2014  . External hemorrhoids 06/06/2013  . PAF (paroxysmal atrial fibrillation), maintaing SR 05/11/2013  . Chronic anticoagulation, new- eliquis 05/11/2013  . CAD (coronary artery disease), per cath 04/27/13, non obstructive disease 20-30% LM; LAD 30%; 50-60% OM2; RCA 40%; EF 65-70% 05/11/2013  . Mitral regurgitation,2+ by cath 05/11/2013  . Atrial fibrillation with RVR, hx of PAF previously 04/27/2013  . PAD (peripheral artery disease), Lt carotid 50-70%: , Hx stent Lt exteral iliac & RSFA stent, known occl. LSFA  04/27/2013  . Hyperlipidemia 04/27/2013  . History of CVA (cerebrovascular accident) 04/27/2013  . NSTEMI (non-ST elevated myocardial infarction) ([place]) 04/27/2013    Shakti Fleer PT, DPT 11/18/2017, 9:57 AM  [facility] [address] [place], [place], [address] Phone: [phone]   Fax:  [phone]  Name: MAGDELINE PRANGE MRN: [id number] Date of Birth: 1951-07-14

## 2017-11-24 ENCOUNTER — Ambulatory Visit: Payer: Medicare Other | Attending: Physical Medicine & Rehabilitation | Admitting: Physical Therapy

## 2017-11-24 ENCOUNTER — Encounter: Payer: Self-pay | Admitting: Physical Therapy

## 2017-11-24 DIAGNOSIS — R296 Repeated falls: Secondary | ICD-10-CM | POA: Insufficient documentation

## 2017-11-24 DIAGNOSIS — R2689 Other abnormalities of gait and mobility: Secondary | ICD-10-CM | POA: Diagnosis present

## 2017-11-24 DIAGNOSIS — R2681 Unsteadiness on feet: Secondary | ICD-10-CM

## 2017-11-24 DIAGNOSIS — M6281 Muscle weakness (generalized): Secondary | ICD-10-CM | POA: Insufficient documentation

## 2017-11-24 NOTE — Therapy (Signed)
[facility] [address] [place], [place], [address] Phone: [phone]   Fax:  [phone]  Physical Therapy Treatment  Patient Details  Name: [name] MRN: [id number] Date of Birth: 04-28-1951 Referring Provider: Jamse Arn, MD   Encounter Date: 11/24/2017  PT End of Session - 11/24/17 1225    Visit Number  5    Number of Visits  34    Date for PT Re-Evaluation  01/07/18    Authorization Type  Medicare G-code & progress note every 10th visit    PT Start Time  1015    PT Stop Time  1100    PT Time Calculation (min)  45 min    Equipment Utilized During Treatment  Gait belt    Activity Tolerance  Patient tolerated treatment well    Behavior During Therapy  [facility] for tasks assessed/performed       Past Medical History:  Diagnosis Date  . Arthritis    "fingers, some in my knees" (01/28/2016)  . CAD (coronary artery disease), per cath 04/27/13, non obstructive disease, EF 65-70% 05/11/2013  . Carotid disease, bilateral ([place]) 09/2013   Lt carotid 50-69% stentosis, less on rt  . Dysrhythmia   . Fatty liver   . GERD (gastroesophageal reflux disease)   . Glaucoma   . H/O cardiovascular stress test 12/27/2009   negative for ischemia  . Headache    "occasional since eye pressure regulated" (01/28/2016)  . Hyperlipidemia   . Hypertension   . Leukocytosis 07/15/2015  . Migraine    "stopped w/laser holes to relieve pressure in my eyes; had them 3-4 times/wk before taht" (01/28/2016)  . Mitral regurgitation,2+ by cath 05/11/2013  . NSTEMI (non-ST elevated myocardial infarction) ([place]) 04/27/2013  . Pain    BOTH KNEES - PT HAS TORN MENISCUS LEFT KNEE  . Paroxysmal atrial fibrillation (HCC)    hx of a fib on ASA  AND ELIQUIS   . Polycythemia vera ([place]) 08/20/2015   JAK-2 positive 07/19/15  . PVD (peripheral vascular disease) with claudication ([place]) 07/2006   previous L ext iliac stent and rt SFA stent, known occluded Lt SFA   . S/P cardiac cath 04/27/13   NON OBSTRUCTIVE DISEASE, 2+ mr, ef 65-70%  . Statin intolerance   . Stroke ([place]) 2006   SWELLING ALL OVER AND RT SIDE OF FACE DRAWN AND SPEECH SLURRED AND NUMBNESS ON RIGHT SIDE-- ALL RESOLVED    Past Surgical History:  Procedure Laterality Date  . AMPUTATION Right 05/21/2017   Procedure: AMPUTATION ABOVE KNEE;  Surgeon: Rosetta Posner, MD;  Location: [facility];  Service: Vascular;  Laterality: Right;  . CARDIAC CATHETERIZATION  04/27/13   non occlusive disease with mild to mod. calcified lesions in ostial LM and proximal LAD and moderate prox. RC AND DISTAL AV GROOVE LCX, ef  . CATARACT EXTRACTION W/PHACO Right 03/21/2015   Procedure: CATARACT EXTRACTION PHACO AND INTRAOCULAR LENS PLACEMENT RIGHT EYE;  Surgeon: Baruch Goldmann, MD;  [facility];  Service: Ophthalmology;  Laterality: Right;  CDE:5.10  . CATARACT EXTRACTION W/PHACO Left 05/16/2015   Procedure: CATARACT EXTRACTION PHACO AND INTRAOCULAR LENS PLACEMENT (IOC);  Surgeon: Baruch Goldmann, MD;  [facility]: [facility];  Service: Ophthalmology;  Laterality: Left;  CDE 5.57  . [place]  . COLONOSCOPY N/A 09/11/2016   Procedure: COLONOSCOPY;  Surgeon: Rogene Houston, MD;  [facility];  Service: Endoscopy;  Laterality: N/A;  730-moved to 1:00 Ann notified pt  .  EMBOLECTOMY Right 02/01/2016   Procedure: Thrombectomy of Right Femoral-Popliteal Bypass Graft; Endarterectomy of Right Below Knee Popliteal Artery and Tibial Peroneal Trunk with Bovine Pericardium Patch Angioplasty.;  Surgeon: Angelia Mould, MD;  Location: [facility];  Service: Vascular;  Laterality: Right;  . FEMORAL-POPLITEAL BYPASS GRAFT Right 01/31/2016   Procedure: BYPASS GRAFT RIGHT COMMON  FEMORALTO BELOW KNEE POPLITEAL ARTERY BYPASS GRAFT USING 6MM PROPATEN GORTEX GRAFT;  Surgeon: Mal Misty, MD;  Location: [facility];  Service: Vascular;  Laterality: Right;  . FEMORAL-POPLITEAL BYPASS GRAFT Right 01/09/2017   Procedure: THROMBECTOMY  OF RIGHT FEMORAL-POPLITEAL  ARTERY BYPASS GRAFT; THROMBECTOMY RIGHT  TIBIAL VESSELS;  Surgeon: [name], MD;  Location: [facility];  Service: Vascular;  Laterality: Right;  . FEMORAL-POPLITEAL BYPASS GRAFT Right 05/17/2017   Procedure: RIGHT FEMORAL-TIBIAL PERONEAL TRUNK ARTERY BYPASS GRAFT USING 54mX80cm PROPATEN GRAFT WITH REMOVABLE RING;  Surgeon: ERosetta Posner MD;  [facility]  Service: Vascular;  Laterality: Right;  . FRACTURE SURGERY    . ILIAC ARTERY STENT  07/2006   external iliac and Rt SFA stent 07/2006 AND THE LEFT WAS IN 2006  . KNEE ARTHROSCOPY WITH MEDIAL MENISECTOMY Left 03/27/2014   Procedure: left knee arthorscopy with medial chondraplasty of the medial femoral and patella, medial microfracture technique of medial femoral condyl;  Surgeon: RTobi Bastos MD;  [facility];  Service: Orthopedics;  Laterality: Left;  . LEFT HEART CATHETERIZATION WITH CORONARY ANGIOGRAM Right 04/27/2013   Procedure: LEFT HEART CATHETERIZATION WITH CORONARY ANGIOGRAM;  Surgeon: DLeonie Man MD;  [facility];  Service: Cardiovascular;  Laterality: Right;  . LOWER EXTREMITY ANGIOGRAM Right 01/31/2016   Procedure: INTRAOP RIGHT LOWER EXTREMITY ANGIOGRAM;  Surgeon: JMal Misty MD;  [facility]  Service: Vascular;  Laterality: Right;  . ORIF WRIST FRACTURE Right 1983  . OVARIAN CYST REMOVAL Right   . PATCH ANGIOPLASTY Right 01/31/2016   Procedure: RIGHT COMMON FEMORAL AND PROFUNDA FEMORIS ENDARECTOMY WITH PATCH ANGIOPLASTY;  Surgeon: JMal Misty MD;  [facility]  Service: Vascular;  Laterality: Right;  . PATCH ANGIOPLASTY Right 01/09/2017   Procedure: PATCH ANGIOPLASTY RIGHT POPLITEAL ARTERY BYPASS GRAFT;  Surgeon: CElam Dutch MD;  [facility]  Service: Vascular;  Laterality: Right;  . PERIPHERAL VASCULAR CATHETERIZATION N/A 06/27/2015   Procedure: Lower Extremity Angiography;  Surgeon: JLorretta Harp MD;  [facility];  Service:  Cardiovascular;  Laterality: N/A;  . PERIPHERAL VASCULAR CATHETERIZATION Bilateral 01/29/2016   Procedure: Lower Extremity Angiography;  Surgeon: MWellington Hampshire MD;  [facility];  Service: Cardiovascular;  Laterality: Bilateral;  . PERIPHERAL VASCULAR CATHETERIZATION N/A 01/29/2016   Procedure: Abdominal Aortogram;  Surgeon: MWellington Hampshire MD;  [facility];  Service: Cardiovascular;  Laterality: N/A;  . REFRACTIVE SURGERY Bilateral    "6 in one eye; 7 in the other; to relieve pressure; not glaucoma" (01/28/2016)  . SFA Right 06/27/2015   overlapping Bahn covered stents  . TUBAL LIGATION  1983  . VEIN HARVEST Left 05/17/2017   Procedure: LEFT LEG GREATER SAPHENOUS VEIN HARVEST;  Surgeon: ERosetta Posner MD;  [facility]  Service: Vascular;  Laterality: Left;  .Marland KitchenVEIN REPAIR Right 01/31/2016   Procedure: RIGHT GREATER SAPHENOUS VEIN EXAMNED BUT NOT REMOVED;  Surgeon: JMal Misty MD;  [facility]  Service: Vascular;  Laterality: Right;    There were no vitals filed for this visit.  Subjective Assessment -  11/24/17 1023    Subjective  She is wearing prosthesis ~4hrs 2x/day without issues. She saw prosthetist this morning who adjusted the front of sock.     Pertinent History  right TFA, arthritis, CAD,A-Fib, PVD, CVA, Glaucoma, migraines, HTN, NSTEMI,  knee pain/left torn meniscus,     Limitations  Lifting;Standing;Walking    Patient Stated Goals  To use prosthesis walking. Previously 2-4 miles.     Currently in Pain?  No/denies                      [facility] Adult PT Treatment/Exercise - 11/24/17 1015      Transfers   Transfers  Sit to Stand;Stand to Sit    Sit to Stand  4: Min assist;5: Supervision;With upper extremity assist;With armrests;From chair/3-in-1 to axillary crutches    Sit to Stand Details  Tactile cues for weight shifting;Visual cues for safe use of DME/AE;Visual cues/gestures for sequencing;Verbal cues for  technique;Verbal cues for safe use of DME/AE    Stand to Sit  4: Min guard;5: Supervision;With upper extremity assist;With armrests;To chair/3-in-1 from axillary crutches    Stand to Sit Details (indicate cue type and reason)  Tactile cues for sequencing;Tactile cues for posture;Visual cues for safe use of DME/AE;Visual cues/gestures for sequencing;Verbal cues for technique;Verbal cues for safe use of DME/AE      Ambulation/Gait   Ambulation/Gait  Yes    Ambulation/Gait Assistance  4: Min assist;4: Min guard;5: Supervision Initially MinA progressed to min guard to supervision    Ambulation/Gait Assistance Details  demo, verbal cues prior on 4-pt pattern and verbal, tactile cues during on sequencing, crutch placement & wt shift.     Ambulation Distance (Feet)  150 Feet 150' X 2    Assistive device  Crutches;Prosthesis arrived with RW & prosthesis    Ambulation Surface  Indoor;Level    Stairs  Yes    Stairs Assistance  4: Min guard;5: Supervision    Stairs Assistance Details (indicate cue type and reason)  verbal & demo prior & tactile, verbal cues during on sequence with single rail & 1 crutch with varying rail location.     Stair Management Technique  One rail Right;One rail Left;With crutches;Step to pattern;Forwards    Number of Stairs  4 3 reps      Prosthetics   Prosthetic Care Comments   increase wear to 5hrs 2x/day initiating first wear upon arising.     Current prosthetic wear tolerance (days/week)   daily    Current prosthetic wear tolerance (#hours/day)   3-4 hrs 2x/day,  increase to 5hrs 2x/day    Residual limb condition   reports bruising on lower abdomen but prosthetist made changes this morning which should help.     Education Provided  Skin check;Correct ply sock adjustment;Proper Donning;Proper wear schedule/adjustment;Proper weight-bearing schedule/adjustment    Person(s) Educated  Patient    Education Method  Explanation;Demonstration;Verbal cues    Education Method   Verbalized understanding;Tactile cues required;Needs further instruction               PT Short Term Goals - 11/10/17 1908      PT SHORT TERM GOAL #1   Title  Patient demonstrates proper donning of prosthesis including tightening suspension strap. (All STGs Target Date: 12/09/2017)    Time  1    Status  On-going    Target Date  12/09/17      PT SHORT TERM GOAL #2   Title  Patient tolerates wear of  prosthesis >8hrs total/day with skin issues or limb pain.     Time  1    Period  Months    Status  On-going    Target Date  12/09/17      PT SHORT TERM GOAL #3   Title  Patient standing balance reaches 5" anteriorly without UE support and to ground with UE support with supervision.     Time  1    Period  Months    Status  On-going    Target Date  12/09/17      PT SHORT TERM GOAL #4   Title  Patient ambulates 300' with RW & prosthesis with supervision.     Time  1    Period  Months    Status  On-going    Target Date  12/09/17      PT SHORT TERM GOAL #5   Title  Patient negotiates stairs (2 rails), ramps & curbs with RW & prosthesis with supervision.     Time  1    Period  Months    Status  On-going    Target Date  12/09/17        PT Long Term Goals - 11/10/17 1909      PT LONG TERM GOAL #1   Title  Patient verbalizes & demonstrates proper prosthetic care to enable safe use of prosthesis. (All LTGs Target Date: 03/04/2018)    Time  4    Period  Months    Status  On-going    Target Date  03/04/18      PT LONG TERM GOAL #2   Title  Patient tolerates wear of prosthesis >90% of awake hours without skin issues or limb pain to enable function during her day.     Time  4    Period  Months    Status  On-going    Target Date  03/04/18      PT LONG TERM GOAL #3   Title  Patient ambulates household around furniture carrying items with cane or less & prosthesis modified independent.     Time  4    Period  Months    Status  On-going    Target Date  03/04/18      PT  LONG TERM GOAL #4   Title  Berg Balance >/= 45/56 to indicate lower fall risk.     Time  4    Period  Months    Status  On-going    Target Date  03/04/18      PT LONG TERM GOAL #5   Title  Patient ambulates >1000' with LRAD & prosthesis outdoors including grass, ramps & curbs modified independent.     Time  4    Period  Months    Status  On-going    Target Date  03/04/18      PT LONG TERM GOAL #6   Title  Patient performs standing ADLs including cooking, laundry, sweeping, etc with prosthesis & LRAD modified indepedent.     Time  4    Period  Months    Status  On-going    Target Date  03/04/18            Plan - 11/24/17 1226    Clinical Impression Statement  Patient did well for first time using crutches with TFA prosthesis but needs additional skilled instruction prior to performing outside of PT. Patient is improving wear tolerance increasing amount of time the prosthesis is available for  function.     Rehab Potential  Good    Clinical Impairments Affecting Rehab Potential  right TFA, arthritis, CAD,A-Fib, PVD, CVA, Glaucoma, migraines, HTN, NSTEMI,  knee pain/left torn meniscus,     PT Frequency  2x / week    PT Duration  Other (comment) 4 months (17 weeks), will need recert at 2 months    PT Treatment/Interventions  ADLs/Self Care Home Management;DME Instruction;Gait training;Stair training;Functional mobility training;Therapeutic activities;Therapeutic exercise;Balance training;Neuromuscular re-education;Patient/family education;Prosthetic Training    PT Next Visit Plan  Review prosthetic care, prosthetic gait with crutches including ramps, curbs & stairs. If weather permits, gait with crutches on grass. Instruct how to pick up object from floor.     Consulted and Agree with Plan of Care  Patient       Patient will benefit from skilled therapeutic intervention in order to improve the following deficits and impairments:  Abnormal gait, Decreased activity tolerance,  Decreased balance, Decreased endurance, Decreased knowledge of use of DME, Decreased mobility, Decreased strength, Prosthetic Dependency, Postural dysfunction  Visit Diagnosis: Other abnormalities of gait and mobility  Unsteadiness on feet  Muscle weakness (generalized)     Problem List Patient Active Problem List   Diagnosis Date Noted  . Phantom limb pain ([place])   . Hypertensive crisis   . Induration of skin   . Abnormality of gait   . Hypoalbuminemia due to protein-calorie malnutrition ([place])   . Amputee, above knee, right ([place]) 05/25/2017  . Unilateral AKA, right ([place])   . Elevated troponin   . PVD (peripheral vascular disease) ([place])   . Benign essential HTN   . Acute blood loss anemia   . Ischemia of right lower extremity 05/17/2017  . Thrombosis of right femoral artery ([place]) 01/09/2017  . Ischemia of extremity 01/09/2017  . History of colonic polyps 07/30/2016  . Essential hypertension 01/08/2016  . Polycythemia vera ([place]) 08/20/2015  . Leukocytosis 07/15/2015  . S/P arterial stent right SFA overlapping Bahn covered stents 06/27/2015 06/28/2015  . Claudication ([place]) 06/27/2015  . Carotid artery disease ([place]) 08/24/2014  . Osteoarthritis of left knee 03/27/2014  . External hemorrhoids 06/06/2013  . PAF (paroxysmal atrial fibrillation), maintaing SR 05/11/2013  . Chronic anticoagulation, new- eliquis 05/11/2013  . CAD (coronary artery disease), per cath 04/27/13, non obstructive disease 20-30% LM; LAD 30%; 50-60% OM2; RCA 40%; EF 65-70% 05/11/2013  . Mitral regurgitation,2+ by cath 05/11/2013  . Atrial fibrillation with RVR, hx of PAF previously 04/27/2013  . PAD (peripheral artery disease), Lt carotid 50-70%: , Hx stent Lt exteral iliac & RSFA stent, known occl. LSFA  04/27/2013  . Hyperlipidemia 04/27/2013  . History of CVA (cerebrovascular accident) 04/27/2013  . NSTEMI (non-ST elevated myocardial infarction) ([place]) 04/27/2013    Zierra Laroque PT, DPT 11/24/2017,  12:29 PM  [facility] [address] [place], [place], [address] Phone: [phone]   Fax:  [phone]  Name: [name] MRN: [id number] Date of Birth: [birth year]

## 2017-11-26 ENCOUNTER — Encounter: Payer: Self-pay | Admitting: Physical Therapy

## 2017-11-26 ENCOUNTER — Ambulatory Visit: Payer: Medicare Other | Admitting: Physical Therapy

## 2017-11-26 DIAGNOSIS — R2689 Other abnormalities of gait and mobility: Secondary | ICD-10-CM

## 2017-11-26 DIAGNOSIS — M6281 Muscle weakness (generalized): Secondary | ICD-10-CM

## 2017-11-26 DIAGNOSIS — R2681 Unsteadiness on feet: Secondary | ICD-10-CM

## 2017-11-26 NOTE — Therapy (Signed)
[facility] [address] [place], [place], [address] Phone: [phone]   Fax:  [phone]  Physical Therapy Treatment  Patient Details  Name: [name] MRN: [id number] Date of Birth: Jun 02, 1951 Referring Provider: Jamse Arn, MD   Encounter Date: 11/26/2017  PT End of Session - 11/26/17 0938    Visit Number  6    Number of Visits  34    Date for PT Re-Evaluation  01/07/18    Authorization Type  Medicare G-code & progress note every 10th visit    PT Start Time  0933    PT Stop Time  1015    PT Time Calculation (min)  42 min    Equipment Utilized During Treatment  Gait belt    Activity Tolerance  Patient tolerated treatment well    Behavior During Therapy  [facility] for tasks assessed/performed       Past Medical History:  Diagnosis Date  . Arthritis    "fingers, some in my knees" (01/28/2016)  . CAD (coronary artery disease), per cath 04/27/13, non obstructive disease, EF 65-70% 05/11/2013  . Carotid disease, bilateral ([place]) 09/2013   Lt carotid 50-69% stentosis, less on rt  . Dysrhythmia   . Fatty liver   . GERD (gastroesophageal reflux disease)   . Glaucoma   . H/O cardiovascular stress test 12/27/2009   negative for ischemia  . Headache    "occasional since eye pressure regulated" (01/28/2016)  . Hyperlipidemia   . Hypertension   . Leukocytosis 07/15/2015  . Migraine    "stopped w/laser holes to relieve pressure in my eyes; had them 3-4 times/wk before taht" (01/28/2016)  . Mitral regurgitation,2+ by cath 05/11/2013  . NSTEMI (non-ST elevated myocardial infarction) ([place]) 04/27/2013  . Pain    BOTH KNEES - PT HAS TORN MENISCUS LEFT KNEE  . Paroxysmal atrial fibrillation (HCC)    hx of a fib on ASA  AND ELIQUIS   . Polycythemia vera ([place]) 08/20/2015   JAK-2 positive 07/19/15  . PVD (peripheral vascular disease) with claudication ([place]) 07/2006   previous L ext iliac stent and rt SFA stent, known occluded Lt SFA   . S/P cardiac cath 04/27/13   NON OBSTRUCTIVE DISEASE, 2+ mr, ef 65-70%  . Statin intolerance   . Stroke ([place]) 2006   SWELLING ALL OVER AND RT SIDE OF FACE DRAWN AND SPEECH SLURRED AND NUMBNESS ON RIGHT SIDE-- ALL RESOLVED    Past Surgical History:  Procedure Laterality Date  . AMPUTATION Right 05/21/2017   Procedure: AMPUTATION ABOVE KNEE;  Surgeon: Rosetta Posner, MD;  Location: [facility];  Service: Vascular;  Laterality: Right;  . CARDIAC CATHETERIZATION  04/27/13   non occlusive disease with mild to mod. calcified lesions in ostial LM and proximal LAD and moderate prox. RC AND DISTAL AV GROOVE LCX, ef  . CATARACT EXTRACTION W/PHACO Right 03/21/2015   Procedure: CATARACT EXTRACTION PHACO AND INTRAOCULAR LENS PLACEMENT RIGHT EYE;  Surgeon: Baruch Goldmann, MD;  [facility];  Service: Ophthalmology;  Laterality: Right;  CDE:5.10  . CATARACT EXTRACTION W/PHACO Left 05/16/2015   Procedure: CATARACT EXTRACTION PHACO AND INTRAOCULAR LENS PLACEMENT (IOC);  Surgeon: Baruch Goldmann, MD;  Location: [facility];  Service: Ophthalmology;  Laterality: Left;  CDE 5.57  . [place]  . COLONOSCOPY N/A 09/11/2016   Procedure: COLONOSCOPY;  Surgeon: Rogene Houston, MD;  [facility];  Service: Endoscopy;  Laterality: N/A;  730-moved to 1:00 Ann notified pt  .  EMBOLECTOMY Right 02/01/2016   Procedure: Thrombectomy of Right Femoral-Popliteal Bypass Graft; Endarterectomy of Right Below Knee Popliteal Artery and Tibial Peroneal Trunk with Bovine Pericardium Patch Angioplasty.;  Surgeon: Angelia Mould, MD;  Location: [facility];  Service: Vascular;  Laterality: Right;  . FEMORAL-POPLITEAL BYPASS GRAFT Right 01/31/2016   Procedure: BYPASS GRAFT RIGHT COMMON  FEMORALTO BELOW KNEE POPLITEAL ARTERY BYPASS GRAFT USING 6MM PROPATEN GORTEX GRAFT;  Surgeon: Mal Misty, MD;  Location: [facility];  Service: Vascular;  Laterality: Right;  . FEMORAL-POPLITEAL BYPASS GRAFT Right 01/09/2017   Procedure: THROMBECTOMY  OF RIGHT FEMORAL-POPLITEAL  ARTERY BYPASS GRAFT; THROMBECTOMY RIGHT  TIBIAL VESSELS;  Surgeon: [name], MD;  Location: [facility];  Service: Vascular;  Laterality: Right;  . FEMORAL-POPLITEAL BYPASS GRAFT Right 05/17/2017   Procedure: RIGHT FEMORAL-TIBIAL PERONEAL TRUNK ARTERY BYPASS GRAFT USING 54mX80cm PROPATEN GRAFT WITH REMOVABLE RING;  Surgeon: ERosetta Posner MD;  [facility]  Service: Vascular;  Laterality: Right;  . FRACTURE SURGERY    . ILIAC ARTERY STENT  07/2006   external iliac and Rt SFA stent 07/2006 AND THE LEFT WAS IN 2006  . KNEE ARTHROSCOPY WITH MEDIAL MENISECTOMY Left 03/27/2014   Procedure: left knee arthorscopy with medial chondraplasty of the medial femoral and patella, medial microfracture technique of medial femoral condyl;  Surgeon: RTobi Bastos MD;  [facility];  Service: Orthopedics;  Laterality: Left;  . LEFT HEART CATHETERIZATION WITH CORONARY ANGIOGRAM Right 04/27/2013   Procedure: LEFT HEART CATHETERIZATION WITH CORONARY ANGIOGRAM;  Surgeon: DLeonie Man MD;  [facility];  Service: Cardiovascular;  Laterality: Right;  . LOWER EXTREMITY ANGIOGRAM Right 01/31/2016   Procedure: INTRAOP RIGHT LOWER EXTREMITY ANGIOGRAM;  Surgeon: JMal Misty MD;  [facility]  Service: Vascular;  Laterality: Right;  . ORIF WRIST FRACTURE Right 1983  . OVARIAN CYST REMOVAL Right   . PATCH ANGIOPLASTY Right 01/31/2016   Procedure: RIGHT COMMON FEMORAL AND PROFUNDA FEMORIS ENDARECTOMY WITH PATCH ANGIOPLASTY;  Surgeon: JMal Misty MD;  [facility]  Service: Vascular;  Laterality: Right;  . PATCH ANGIOPLASTY Right 01/09/2017   Procedure: PATCH ANGIOPLASTY RIGHT POPLITEAL ARTERY BYPASS GRAFT;  Surgeon: CElam Dutch MD;  [facility]  Service: Vascular;  Laterality: Right;  . PERIPHERAL VASCULAR CATHETERIZATION N/A 06/27/2015   Procedure: Lower Extremity Angiography;  Surgeon: JLorretta Harp MD;  [facility];  Service:  Cardiovascular;  Laterality: N/A;  . PERIPHERAL VASCULAR CATHETERIZATION Bilateral 01/29/2016   Procedure: Lower Extremity Angiography;  Surgeon: MWellington Hampshire MD;  [facility];  Service: Cardiovascular;  Laterality: Bilateral;  . PERIPHERAL VASCULAR CATHETERIZATION N/A 01/29/2016   Procedure: Abdominal Aortogram;  Surgeon: MWellington Hampshire MD;  [facility];  Service: Cardiovascular;  Laterality: N/A;  . REFRACTIVE SURGERY Bilateral    "6 in one eye; 7 in the other; to relieve pressure; not glaucoma" (01/28/2016)  . SFA Right 06/27/2015   overlapping Bahn covered stents  . TUBAL LIGATION  1983  . VEIN HARVEST Left 05/17/2017   Procedure: LEFT LEG GREATER SAPHENOUS VEIN HARVEST;  Surgeon: ERosetta Posner MD;  [facility]  Service: Vascular;  Laterality: Left;  .Marland KitchenVEIN REPAIR Right 01/31/2016   Procedure: RIGHT GREATER SAPHENOUS VEIN EXAMNED BUT NOT REMOVED;  Surgeon: JMal Misty MD;  [facility]  Service: Vascular;  Laterality: Right;    There were no vitals filed for this visit.  Subjective Assessment -  11/26/17 0937    Subjective  No new complaints, no falls to report, some phantom pain around knee. Pt still wearing 4hrs/ 2x day with no skin issues.     Pertinent History  right TFA, arthritis, CAD,A-Fib, PVD, CVA, Glaucoma, migraines, HTN, NSTEMI,  knee pain/left torn meniscus,     Limitations  Lifting;Standing;Walking    Patient Stated Goals  To use prosthesis walking. Previously 2-4 miles.     Currently in Pain?  No/denies      [facility] Adult PT Treatment/Exercise - 11/26/17 0956      Transfers   Transfers  Sit to Stand;Stand to Sit    Sit to Stand  4: Min guard    Sit to Stand Details  Verbal cues for safe use of DME/AE;Verbal cues for sequencing    Sit to Stand Details (indicate cue type and reason)  Pt able to stand up from armed chair to axillary crutches with min guard, cues for sequencing.     Stand to Sit  4: Min guard    Stand to Sit  Details (indicate cue type and reason)  Verbal cues for sequencing;Verbal cues for safe use of DME/AE    Stand to Sit Details  Min guard, cues for hand placement and slow descent.       Ambulation/Gait   Ambulation/Gait  Yes    Ambulation/Gait Assistance  4: Min assist    Ambulation/Gait Assistance Details  Cues for technique/sequencing 4-point gait pattern with crutches and to look straight ahead with gait.     Ambulation Distance (Feet)  115 Feet    Assistive device  R Axillary Crutch;L Axillary Crutch;Prosthesis    Gait Pattern  Wide base of support;Poor foot clearance - right;Step-through pattern    Ambulation Surface  Level;Indoor    Ramp  4: Min assist    Ramp Details (indicate cue type and reason)  Pt tolerated this well with cues for weightshift with ascend/descend.     Curb  3: Mod assist;4: Min assist    Curb Details (indicate cue type and reason)  Pt demonstrating instability with sequence and technique, therapist instructed pt to go up backwards with axillary crutches, pt was much more stable with this technique.        PT Short Term Goals - 11/10/17 1908      PT SHORT TERM GOAL #1   Title  Patient demonstrates proper donning of prosthesis including tightening suspension strap. (All STGs Target Date: 12/09/2017)    Time  1    Status  On-going    Target Date  12/09/17      PT SHORT TERM GOAL #2   Title  Patient tolerates wear of prosthesis >8hrs total/day with skin issues or limb pain.     Time  1    Period  Months    Status  On-going    Target Date  12/09/17      PT SHORT TERM GOAL #3   Title  Patient standing balance reaches 5" anteriorly without UE support and to ground with UE support with supervision.     Time  1    Period  Months    Status  On-going    Target Date  12/09/17      PT SHORT TERM GOAL #4   Title  Patient ambulates 300' with RW & prosthesis with supervision.     Time  1    Period  Months    Status  On-going    Target Date  12/09/17      PT  SHORT TERM GOAL #5   Title  Patient negotiates stairs (2 rails), ramps & curbs with RW & prosthesis with supervision.     Time  1    Period  Months    Status  On-going    Target Date  12/09/17        PT Long Term Goals - 11/10/17 1909      PT LONG TERM GOAL #1   Title  Patient verbalizes & demonstrates proper prosthetic care to enable safe use of prosthesis. (All LTGs Target Date: 03/04/2018)    Time  4    Period  Months    Status  On-going    Target Date  03/04/18      PT LONG TERM GOAL #2   Title  Patient tolerates wear of prosthesis >90% of awake hours without skin issues or limb pain to enable function during her day.     Time  4    Period  Months    Status  On-going    Target Date  03/04/18      PT LONG TERM GOAL #3   Title  Patient ambulates household around furniture carrying items with cane or less & prosthesis modified independent.     Time  4    Period  Months    Status  On-going    Target Date  03/04/18      PT LONG TERM GOAL #4   Title  Berg Balance >/= 45/56 to indicate lower fall risk.     Time  4    Period  Months    Status  On-going    Target Date  03/04/18      PT LONG TERM GOAL #5   Title  Patient ambulates >1000' with LRAD & prosthesis outdoors including grass, ramps & curbs modified independent.     Time  4    Period  Months    Status  On-going    Target Date  03/04/18      PT LONG TERM GOAL #6   Title  Patient performs standing ADLs including cooking, laundry, sweeping, etc with prosthesis & LRAD modified indepedent.     Time  4    Period  Months    Status  On-going    Target Date  03/04/18       Plan - 11/26/17 1631    Clinical Impression Statement  Pt tolerated treatment well with no limitations due to pain or fatigue. Todays session focused on gait training with prosthesis and bilateral axillary crutches. Pt would benefit from continued PT sessions.     Rehab Potential  Good    Clinical Impairments Affecting Rehab Potential  right TFA,  arthritis, CAD,A-Fib, PVD, CVA, Glaucoma, migraines, HTN, NSTEMI,  knee pain/left torn meniscus,     PT Frequency  2x / week    PT Duration  Other (comment) 4 months (17 weeks), will need recert at 2 months    PT Treatment/Interventions  ADLs/Self Care Home Management;DME Instruction;Gait training;Stair training;Functional mobility training;Therapeutic activities;Therapeutic exercise;Balance training;Neuromuscular re-education;Patient/family education;Prosthetic Training    PT Next Visit Plan  Review prosthetic care, prosthetic gait with crutches including ramps, curbs & stairs. If weather permits, gait with crutches on grass. Instruct how to pick up object from floor.     Consulted and Agree with Plan of Care  Patient       Patient will benefit from skilled therapeutic intervention in order to improve the following  deficits and impairments:  Abnormal gait, Decreased activity tolerance, Decreased balance, Decreased endurance, Decreased knowledge of use of DME, Decreased mobility, Decreased strength, Prosthetic Dependency, Postural dysfunction  Visit Diagnosis: Other abnormalities of gait and mobility  Unsteadiness on feet  Muscle weakness (generalized)     Problem List Patient Active Problem List   Diagnosis Date Noted  . Phantom limb pain ([place])   . Hypertensive crisis   . Induration of skin   . Abnormality of gait   . Hypoalbuminemia due to protein-calorie malnutrition ([place])   . Amputee, above knee, right ([place]) 05/25/2017  . Unilateral AKA, right ([place])   . Elevated troponin   . PVD (peripheral vascular disease) ([place])   . Benign essential HTN   . Acute blood loss anemia   . Ischemia of right lower extremity 05/17/2017  . Thrombosis of right femoral artery ([place]) 01/09/2017  . Ischemia of extremity 01/09/2017  . History of colonic polyps 07/30/2016  . Essential hypertension 01/08/2016  . Polycythemia vera ([place]) 08/20/2015  . Leukocytosis 07/15/2015  . S/P arterial stent right  SFA overlapping Bahn covered stents 06/27/2015 06/28/2015  . Claudication ([place]) 06/27/2015  . Carotid artery disease ([place]) 08/24/2014  . Osteoarthritis of left knee 03/27/2014  . External hemorrhoids 06/06/2013  . PAF (paroxysmal atrial fibrillation), maintaing SR 05/11/2013  . Chronic anticoagulation, new- eliquis 05/11/2013  . CAD (coronary artery disease), per cath 04/27/13, non obstructive disease 20-30% LM; LAD 30%; 50-60% OM2; RCA 40%; EF 65-70% 05/11/2013  . Mitral regurgitation,2+ by cath 05/11/2013  . Atrial fibrillation with RVR, hx of PAF previously 04/27/2013  . PAD (peripheral artery disease), Lt carotid 50-70%: , Hx stent Lt exteral iliac & RSFA stent, known occl. LSFA  04/27/2013  . Hyperlipidemia 04/27/2013  . History of CVA (cerebrovascular accident) 04/27/2013  . NSTEMI (non-ST elevated myocardial infarction) ([place]) 04/27/2013   Elhadji Pecore, SPTA  [name] 11/26/2017, 4:33 PM  [facility] [address] [place], [place], [address] Phone: [phone]   Fax:  [phone]  Name: [name] MRN: [id number] Date of Birth: Apr 11, 1951

## 2017-11-30 ENCOUNTER — Encounter: Payer: Medicare Other | Admitting: Physical Therapy

## 2017-12-01 ENCOUNTER — Encounter (HOSPITAL_COMMUNITY): Payer: Medicare Other

## 2017-12-01 ENCOUNTER — Other Ambulatory Visit: Payer: Medicare Other

## 2017-12-02 ENCOUNTER — Ambulatory Visit: Payer: Medicare Other | Admitting: Physical Therapy

## 2017-12-02 ENCOUNTER — Encounter: Payer: Self-pay | Admitting: Physical Therapy

## 2017-12-02 ENCOUNTER — Encounter (HOSPITAL_COMMUNITY): Payer: Medicare Other

## 2017-12-02 DIAGNOSIS — M6281 Muscle weakness (generalized): Secondary | ICD-10-CM

## 2017-12-02 DIAGNOSIS — R2689 Other abnormalities of gait and mobility: Secondary | ICD-10-CM

## 2017-12-02 DIAGNOSIS — R2681 Unsteadiness on feet: Secondary | ICD-10-CM

## 2017-12-03 NOTE — Therapy (Signed)
[facility] [address] [place], [place], [address] Phone: [phone]   Fax:  [phone]  Physical Therapy Treatment  Patient Details  Name: [name] MRN: [id number] Date of Birth: [birth year] Referring Provider: Jamse Arn, MD   Encounter Date: 12/02/2017  PT End of Session - 12/02/17 1546    Visit Number  7    Number of Visits  34    Date for PT Re-Evaluation  01/07/18    Authorization Type  Medicare G-code & progress note every 10th visit    PT Start Time  1535    PT Stop Time  1615    PT Time Calculation (min)  40 min    Equipment Utilized During Treatment  Gait belt    Activity Tolerance  Patient tolerated treatment well    Behavior During Therapy  [facility] for tasks assessed/performed       Past Medical History:  Diagnosis Date  . Arthritis    "fingers, some in my knees" (01/28/2016)  . CAD (coronary artery disease), per cath 04/27/13, non obstructive disease, EF 65-70% 05/11/2013  . Carotid disease, bilateral ([place]) 09/2013   Lt carotid 50-69% stentosis, less on rt  . Dysrhythmia   . Fatty liver   . GERD (gastroesophageal reflux disease)   . Glaucoma   . H/O cardiovascular stress test 12/27/2009   negative for ischemia  . Headache    "occasional since eye pressure regulated" (01/28/2016)  . Hyperlipidemia   . Hypertension   . Leukocytosis 07/15/2015  . Migraine    "stopped w/laser holes to relieve pressure in my eyes; had them 3-4 times/wk before taht" (01/28/2016)  . Mitral regurgitation,2+ by cath 05/11/2013  . NSTEMI (non-ST elevated myocardial infarction) ([place]) 04/27/2013  . Pain    BOTH KNEES - PT HAS TORN MENISCUS LEFT KNEE  . Paroxysmal atrial fibrillation (HCC)    hx of a fib on ASA  AND ELIQUIS   . Polycythemia vera ([place]) 08/20/2015   JAK-2 positive 07/19/15  . PVD (peripheral vascular disease) with claudication ([place]) 07/2006   previous L ext iliac stent and rt SFA stent, known occluded Lt SFA   . S/P cardiac cath 04/27/13   NON OBSTRUCTIVE DISEASE, 2+ mr, ef 65-70%  . Statin intolerance   . Stroke ([place]) 2006   SWELLING ALL OVER AND RT SIDE OF FACE DRAWN AND SPEECH SLURRED AND NUMBNESS ON RIGHT SIDE-- ALL RESOLVED    Past Surgical History:  Procedure Laterality Date  . AMPUTATION Right 05/21/2017   Procedure: AMPUTATION ABOVE KNEE;  Surgeon: Rosetta Posner, MD;  Location: [facility];  Service: Vascular;  Laterality: Right;  . CARDIAC CATHETERIZATION  04/27/13   non occlusive disease with mild to mod. calcified lesions in ostial LM and proximal LAD and moderate prox. RC AND DISTAL AV GROOVE LCX, ef  . CATARACT EXTRACTION W/PHACO Right 03/21/2015   Procedure: CATARACT EXTRACTION PHACO AND INTRAOCULAR LENS PLACEMENT RIGHT EYE;  Surgeon: Baruch Goldmann, MD;  [facility];  Service: Ophthalmology;  Laterality: Right;  CDE:5.10  . CATARACT EXTRACTION W/PHACO Left 05/16/2015   Procedure: CATARACT EXTRACTION PHACO AND INTRAOCULAR LENS PLACEMENT (IOC);  Surgeon: Baruch Goldmann, MD;  Location: [facility];  Service: Ophthalmology;  Laterality: Left;  CDE 5.57  . [place]  . COLONOSCOPY N/A 09/11/2016   Procedure: COLONOSCOPY;  Surgeon: Rogene Houston, MD;  [facility];  Service: Endoscopy;  Laterality: N/A;  730-moved to 1:00 Ann notified pt  .  EMBOLECTOMY Right 02/01/2016   Procedure: Thrombectomy of Right Femoral-Popliteal Bypass Graft; Endarterectomy of Right Below Knee Popliteal Artery and Tibial Peroneal Trunk with Bovine Pericardium Patch Angioplasty.;  Surgeon: Angelia Mould, MD;  Location: [facility];  Service: Vascular;  Laterality: Right;  . FEMORAL-POPLITEAL BYPASS GRAFT Right 01/31/2016   Procedure: BYPASS GRAFT RIGHT COMMON  FEMORALTO BELOW KNEE POPLITEAL ARTERY BYPASS GRAFT USING 6MM PROPATEN GORTEX GRAFT;  Surgeon: Mal Misty, MD;  Location: [facility];  Service: Vascular;  Laterality: Right;  . FEMORAL-POPLITEAL BYPASS GRAFT Right 01/09/2017   Procedure: THROMBECTOMY  OF RIGHT FEMORAL-POPLITEAL  ARTERY BYPASS GRAFT; THROMBECTOMY RIGHT  TIBIAL VESSELS;  Surgeon: [name], MD;  Location: [facility];  Service: Vascular;  Laterality: Right;  . FEMORAL-POPLITEAL BYPASS GRAFT Right 05/17/2017   Procedure: RIGHT FEMORAL-TIBIAL PERONEAL TRUNK ARTERY BYPASS GRAFT USING 54mX80cm PROPATEN GRAFT WITH REMOVABLE RING;  Surgeon: ERosetta Posner MD;  [facility]  Service: Vascular;  Laterality: Right;  . FRACTURE SURGERY    . ILIAC ARTERY STENT  07/2006   external iliac and Rt SFA stent 07/2006 AND THE LEFT WAS IN 2006  . KNEE ARTHROSCOPY WITH MEDIAL MENISECTOMY Left 03/27/2014   Procedure: left knee arthorscopy with medial chondraplasty of the medial femoral and patella, medial microfracture technique of medial femoral condyl;  Surgeon: RTobi Bastos MD;  [facility];  Service: Orthopedics;  Laterality: Left;  . LEFT HEART CATHETERIZATION WITH CORONARY ANGIOGRAM Right 04/27/2013   Procedure: LEFT HEART CATHETERIZATION WITH CORONARY ANGIOGRAM;  Surgeon: DLeonie Man MD;  [facility];  Service: Cardiovascular;  Laterality: Right;  . LOWER EXTREMITY ANGIOGRAM Right 01/31/2016   Procedure: INTRAOP RIGHT LOWER EXTREMITY ANGIOGRAM;  Surgeon: JMal Misty MD;  [facility]  Service: Vascular;  Laterality: Right;  . ORIF WRIST FRACTURE Right 1983  . OVARIAN CYST REMOVAL Right   . PATCH ANGIOPLASTY Right 01/31/2016   Procedure: RIGHT COMMON FEMORAL AND PROFUNDA FEMORIS ENDARECTOMY WITH PATCH ANGIOPLASTY;  Surgeon: JMal Misty MD;  [facility]  Service: Vascular;  Laterality: Right;  . PATCH ANGIOPLASTY Right 01/09/2017   Procedure: PATCH ANGIOPLASTY RIGHT POPLITEAL ARTERY BYPASS GRAFT;  Surgeon: CElam Dutch MD;  [facility]  Service: Vascular;  Laterality: Right;  . PERIPHERAL VASCULAR CATHETERIZATION N/A 06/27/2015   Procedure: Lower Extremity Angiography;  Surgeon: JLorretta Harp MD;  [facility];  Service:  Cardiovascular;  Laterality: N/A;  . PERIPHERAL VASCULAR CATHETERIZATION Bilateral 01/29/2016   Procedure: Lower Extremity Angiography;  Surgeon: MWellington [place] MD;  [facility];  Service: Cardiovascular;  Laterality: Bilateral;  . PERIPHERAL VASCULAR CATHETERIZATION N/A 01/29/2016   Procedure: Abdominal Aortogram;  Surgeon: MWellington [place] MD;  [facility];  Service: Cardiovascular;  Laterality: N/A;  . REFRACTIVE SURGERY Bilateral    "6 in one eye; 7 in the other; to relieve pressure; not glaucoma" (01/28/2016)  . SFA Right 06/27/2015   overlapping Bahn covered stents  . TUBAL LIGATION  1983  . VEIN HARVEST Left 05/17/2017   Procedure: LEFT LEG GREATER SAPHENOUS VEIN HARVEST;  Surgeon: ERosetta Posner MD;  [facility]  Service: Vascular;  Laterality: Left;  .Marland KitchenVEIN REPAIR Right 01/31/2016   Procedure: RIGHT GREATER SAPHENOUS VEIN EXAMNED BUT NOT REMOVED;  Surgeon: JMal Misty MD;  [facility]  Service: Vascular;  Laterality: Right;    There were no vitals filed for this visit.  Subjective Assessment -  12/02/17 1542    Subjective  No new complaitns. No falls to report. Having pain at distal end of limb over where bone ends.     Pertinent History  right TFA, arthritis, CAD,A-Fib, PVD, CVA, Glaucoma, migraines, HTN, NSTEMI,  knee pain/left torn meniscus,     Limitations  Lifting;Standing;Walking    Patient Stated Goals  To use prosthesis walking. Previously 2-4 miles.     Currently in Pain?  Yes    Pain Score  7  with weight bearing only/ 0/10 otherwise    Pain Location  Leg    Pain Orientation  Right    Pain Descriptors / Indicators  Pressure    Pain Type  Chronic pain    Pain Onset  More than a month ago    Aggravating Factors   weight bearing through prosthesis    Pain Relieving Factors  unweighting limb            [facility] Adult PT Treatment/Exercise - 12/02/17 1547      Transfers   Transfers  Sit to Stand;Stand to Sit    Sit to  Stand  4: Min guard;With upper extremity assist;From chair/3-in-1    Sit to Stand Details  Verbal cues for safe use of DME/AE;Verbal cues for sequencing    Stand to Sit  4: Min guard;With upper extremity assist;To chair/3-in-1    Stand to Sit Details (indicate cue type and reason)  Verbal cues for sequencing;Verbal cues for safe use of DME/AE      Ambulation/Gait   Ambulation/Gait  Yes    Ambulation/Gait Assistance  4: Min guard;4: Min assist    Ambulation/Gait Assistance Details  verbal/visual cues on posture, equal step length and weight shifting. pt noted to catch/drag prosthetic foot with swing phase. can correct this with cues to increase step lenght and for more hip/prosthetic knee flexion. does not carryover for more that 1-2 steps.     Ambulation Distance (Feet)  120 Feet x 2    Assistive device  Crutches;Prosthesis    Gait Pattern  Wide base of support;Poor foot clearance - right;Step-through pattern    Ambulation Surface  Level;Indoor    Stairs  Yes    Stairs Assistance  4: Min guard    Stairs Assistance Details (indicate cue type and reason)  cues on weight shifting and sequencing     Stair Management Technique  One rail Left;Step to pattern;Forwards;With crutches    Number of Stairs  4      Prosthetics   Current prosthetic wear tolerance (days/week)   daily    Current prosthetic wear tolerance (#hours/day)   5 hrs 2x day most days, some days less due to scheduling    Residual limb condition   intact per pt report.  bruising at groin is clearing up    Education Provided  Proper wear schedule/adjustment;Proper weight-bearing schedule/adjustment    Person(s) Educated  Patient    Education Method  Explanation;Verbal cues    Education Method  Verbalized understanding;Needs further instruction        PT Short Term Goals - 11/10/17 1908      PT SHORT TERM GOAL #1   Title  Patient demonstrates proper donning of prosthesis including tightening suspension strap. (All STGs Target  Date: 12/09/2017)    Time  1    Status  On-going    Target Date  12/09/17      PT SHORT TERM GOAL #2   Title  Patient tolerates wear of prosthesis >8hrs total/day  with skin issues or limb pain.     Time  1    Period  Months    Status  On-going    Target Date  12/09/17      PT SHORT TERM GOAL #3   Title  Patient standing balance reaches 5" anteriorly without UE support and to ground with UE support with supervision.     Time  1    Period  Months    Status  On-going    Target Date  12/09/17      PT SHORT TERM GOAL #4   Title  Patient ambulates 300' with RW & prosthesis with supervision.     Time  1    Period  Months    Status  On-going    Target Date  12/09/17      PT SHORT TERM GOAL #5   Title  Patient negotiates stairs (2 rails), ramps & curbs with RW & prosthesis with supervision.     Time  1    Period  Months    Status  On-going    Target Date  12/09/17        PT Long Term Goals - 11/10/17 1909      PT LONG TERM GOAL #1   Title  Patient verbalizes & demonstrates proper prosthetic care to enable safe use of prosthesis. (All LTGs Target Date: 03/04/2018)    Time  4    Period  Months    Status  On-going    Target Date  03/04/18      PT LONG TERM GOAL #2   Title  Patient tolerates wear of prosthesis >90% of awake hours without skin issues or limb pain to enable function during her day.     Time  4    Period  Months    Status  On-going    Target Date  03/04/18      PT LONG TERM GOAL #3   Title  Patient ambulates household around furniture carrying items with cane or less & prosthesis modified independent.     Time  4    Period  Months    Status  On-going    Target Date  03/04/18      PT LONG TERM GOAL #4   Title  Berg Balance >/= 45/56 to indicate lower fall risk.     Time  4    Period  Months    Status  On-going    Target Date  03/04/18      PT LONG TERM GOAL #5   Title  Patient ambulates >1000' with LRAD & prosthesis outdoors including grass, ramps &  curbs modified independent.     Time  4    Period  Months    Status  On-going    Target Date  03/04/18      PT LONG TERM GOAL #6   Title  Patient performs standing ADLs including cooking, laundry, sweeping, etc with prosthesis & LRAD modified indepedent.     Time  4    Period  Months    Status  On-going    Target Date  03/04/18            Plan - 12/02/17 1547    Clinical Impression Statement  Today's skilled session continued to address gait with prosthesis/crutches with ephasis on improved prosthetic foot clearance with swing phase. Pt continues to be limited by pain over distal end of limb, has appt with prosthetist to address this (?  date). Pt is progressing toward goals and should benefit from continued PT to progress toward unmet goals.     Rehab Potential  Good    Clinical Impairments Affecting Rehab Potential  right TFA, arthritis, CAD,A-Fib, PVD, CVA, Glaucoma, migraines, HTN, NSTEMI,  knee pain/left torn meniscus,     PT Frequency  2x / week    PT Duration  Other (comment) 4 months (17 weeks), will need recert at 2 months    PT Treatment/Interventions  ADLs/Self Care Home Management;DME Instruction;Gait training;Stair training;Functional mobility training;Therapeutic activities;Therapeutic exercise;Balance training;Neuromuscular re-education;Patient/family education;Prosthetic Training    PT Next Visit Plan  Review prosthetic care, prosthetic gait with crutches including ramps, curbs & stairs. If weather permits, gait with crutches on grass. Instruct how to pick up object from floor.     Consulted and Agree with Plan of Care  Patient       Patient will benefit from skilled therapeutic intervention in order to improve the following deficits and impairments:  Abnormal gait, Decreased activity tolerance, Decreased balance, Decreased endurance, Decreased knowledge of use of DME, Decreased mobility, Decreased strength, Prosthetic Dependency, Postural dysfunction  Visit  Diagnosis: Other abnormalities of gait and mobility  Unsteadiness on feet  Muscle weakness (generalized)     Problem List Patient Active Problem List   Diagnosis Date Noted  . Phantom limb pain ([place])   . Hypertensive crisis   . Induration of skin   . Abnormality of gait   . Hypoalbuminemia due to protein-calorie malnutrition ([place])   . Amputee, above knee, right ([place]) 05/25/2017  . Unilateral AKA, right ([place])   . Elevated troponin   . PVD (peripheral vascular disease) ([place])   . Benign essential HTN   . Acute blood loss anemia   . Ischemia of right lower extremity 05/17/2017  . Thrombosis of right femoral artery ([place]) 01/09/2017  . Ischemia of extremity 01/09/2017  . History of colonic polyps 07/30/2016  . Essential hypertension 01/08/2016  . Polycythemia vera ([place]) 08/20/2015  . Leukocytosis 07/15/2015  . S/P arterial stent right SFA overlapping Bahn covered stents 06/27/2015 06/28/2015  . Claudication ([place]) 06/27/2015  . Carotid artery disease ([place]) 08/24/2014  . Osteoarthritis of left knee 03/27/2014  . External hemorrhoids 06/06/2013  . PAF (paroxysmal atrial fibrillation), maintaing SR 05/11/2013  . Chronic anticoagulation, new- eliquis 05/11/2013  . CAD (coronary artery disease), per cath 04/27/13, non obstructive disease 20-30% LM; LAD 30%; 50-60% OM2; RCA 40%; EF 65-70% 05/11/2013  . Mitral regurgitation,2+ by cath 05/11/2013  . Atrial fibrillation with RVR, hx of PAF previously 04/27/2013  . PAD (peripheral artery disease), Lt carotid 50-70%: , Hx stent Lt exteral iliac & RSFA stent, known occl. LSFA  04/27/2013  . Hyperlipidemia 04/27/2013  . History of CVA (cerebrovascular accident) 04/27/2013  . NSTEMI (non-ST elevated myocardial infarction) ([place]) 04/27/2013    Willow Ora, PTA, [place] [facility] [address] [place], [place] [address] [phone] 12/03/17, 2:26 PM   Name: RHILEY TARVER MRN: [id number] Date of Birth:  1951-01-05

## 2017-12-06 ENCOUNTER — Encounter: Payer: Self-pay | Admitting: Rehabilitative and Restorative Service Providers"

## 2017-12-06 ENCOUNTER — Ambulatory Visit: Payer: Medicare Other | Admitting: Rehabilitative and Restorative Service Providers"

## 2017-12-06 DIAGNOSIS — R2689 Other abnormalities of gait and mobility: Secondary | ICD-10-CM

## 2017-12-06 DIAGNOSIS — R2681 Unsteadiness on feet: Secondary | ICD-10-CM

## 2017-12-06 DIAGNOSIS — M6281 Muscle weakness (generalized): Secondary | ICD-10-CM

## 2017-12-06 NOTE — Therapy (Signed)
[facility] [address] [place], [place], [address] Phone: [phone]   Fax:  [phone]  Physical Therapy Treatment  Patient Details  Name: [name] MRN: [id number] Date of Birth: Jul 26, 1951 Referring Provider: Jamse Arn, MD   Encounter Date: 12/06/2017  PT End of Session - 12/06/17 1331    Visit Number  8    Number of Visits  34    Date for PT Re-Evaluation  01/07/18    Authorization Type  Medicare G-code & progress note every 10th visit    PT Start Time  0850    PT Stop Time  0932    PT Time Calculation (min)  42 min    Equipment Utilized During Treatment  Gait belt    Activity Tolerance  Patient tolerated treatment well    Behavior During Therapy  [facility] for tasks assessed/performed       Past Medical History:  Diagnosis Date  . Arthritis    "fingers, some in my knees" (01/28/2016)  . CAD (coronary artery disease), per cath 04/27/13, non obstructive disease, EF 65-70% 05/11/2013  . Carotid disease, bilateral ([place]) 09/2013   Lt carotid 50-69% stentosis, less on rt  . Dysrhythmia   . Fatty liver   . GERD (gastroesophageal reflux disease)   . Glaucoma   . H/O cardiovascular stress test 12/27/2009   negative for ischemia  . Headache    "occasional since eye pressure regulated" (01/28/2016)  . Hyperlipidemia   . Hypertension   . Leukocytosis 07/15/2015  . Migraine    "stopped w/laser holes to relieve pressure in my eyes; had them 3-4 times/wk before taht" (01/28/2016)  . Mitral regurgitation,2+ by cath 05/11/2013  . NSTEMI (non-ST elevated myocardial infarction) ([place]) 04/27/2013  . Pain    BOTH KNEES - PT HAS TORN MENISCUS LEFT KNEE  . Paroxysmal atrial fibrillation (HCC)    hx of a fib on ASA  AND ELIQUIS   . Polycythemia vera ([place]) 08/20/2015   JAK-2 positive 07/19/15  . PVD (peripheral vascular disease) with claudication ([place]) 07/2006   previous L ext iliac stent and rt SFA stent, known occluded Lt SFA   . S/P cardiac cath 04/27/13   NON OBSTRUCTIVE DISEASE, 2+ mr, ef 65-70%  . Statin intolerance   . Stroke ([place]) 2006   SWELLING ALL OVER AND RT SIDE OF FACE DRAWN AND SPEECH SLURRED AND NUMBNESS ON RIGHT SIDE-- ALL RESOLVED    Past Surgical History:  Procedure Laterality Date  . AMPUTATION Right 05/21/2017   Procedure: AMPUTATION ABOVE KNEE;  Surgeon: Rosetta Posner, MD;  Location: [facility];  Service: Vascular;  Laterality: Right;  . CARDIAC CATHETERIZATION  04/27/13   non occlusive disease with mild to mod. calcified lesions in ostial LM and proximal LAD and moderate prox. RC AND DISTAL AV GROOVE LCX, ef  . CATARACT EXTRACTION W/PHACO Right 03/21/2015   Procedure: CATARACT EXTRACTION PHACO AND INTRAOCULAR LENS PLACEMENT RIGHT EYE;  Surgeon: Baruch Goldmann, MD;  [facility];  Service: Ophthalmology;  Laterality: Right;  CDE:5.10  . CATARACT EXTRACTION W/PHACO Left 05/16/2015   Procedure: CATARACT EXTRACTION PHACO AND INTRAOCULAR LENS PLACEMENT (IOC);  Surgeon: Baruch Goldmann, MD;  Location: [facility];  Service: Ophthalmology;  Laterality: Left;  CDE 5.57  . [place]  . COLONOSCOPY N/A 09/11/2016   Procedure: COLONOSCOPY;  Surgeon: Rogene Houston, MD;  [facility];  Service: Endoscopy;  Laterality: N/A;  730-moved to 1:00 Ann notified pt  .  EMBOLECTOMY Right 02/01/2016   Procedure: Thrombectomy of Right Femoral-Popliteal Bypass Graft; Endarterectomy of Right Below Knee Popliteal Artery and Tibial Peroneal Trunk with Bovine Pericardium Patch Angioplasty.;  Surgeon: Angelia Mould, MD;  Location: [facility];  Service: Vascular;  Laterality: Right;  . FEMORAL-POPLITEAL BYPASS GRAFT Right 01/31/2016   Procedure: BYPASS GRAFT RIGHT COMMON  FEMORALTO BELOW KNEE POPLITEAL ARTERY BYPASS GRAFT USING 6MM PROPATEN GORTEX GRAFT;  Surgeon: Mal Misty, MD;  Location: [facility];  Service: Vascular;  Laterality: Right;  . FEMORAL-POPLITEAL BYPASS GRAFT Right 01/09/2017   Procedure: THROMBECTOMY  OF RIGHT FEMORAL-POPLITEAL  ARTERY BYPASS GRAFT; THROMBECTOMY RIGHT  TIBIAL VESSELS;  Surgeon: [name], MD;  Location: [facility];  Service: Vascular;  Laterality: Right;  . FEMORAL-POPLITEAL BYPASS GRAFT Right 05/17/2017   Procedure: RIGHT FEMORAL-TIBIAL PERONEAL TRUNK ARTERY BYPASS GRAFT USING 54mX80cm PROPATEN GRAFT WITH REMOVABLE RING;  Surgeon: ERosetta Posner MD;  [facility]  Service: Vascular;  Laterality: Right;  . FRACTURE SURGERY    . ILIAC ARTERY STENT  07/2006   external iliac and Rt SFA stent 07/2006 AND THE LEFT WAS IN 2006  . KNEE ARTHROSCOPY WITH MEDIAL MENISECTOMY Left 03/27/2014   Procedure: left knee arthorscopy with medial chondraplasty of the medial femoral and patella, medial microfracture technique of medial femoral condyl;  Surgeon: RTobi Bastos MD;  [facility];  Service: Orthopedics;  Laterality: Left;  . LEFT HEART CATHETERIZATION WITH CORONARY ANGIOGRAM Right 04/27/2013   Procedure: LEFT HEART CATHETERIZATION WITH CORONARY ANGIOGRAM;  Surgeon: DLeonie Man MD;  [facility];  Service: Cardiovascular;  Laterality: Right;  . LOWER EXTREMITY ANGIOGRAM Right 01/31/2016   Procedure: INTRAOP RIGHT LOWER EXTREMITY ANGIOGRAM;  Surgeon: JMal Misty MD;  [facility]  Service: Vascular;  Laterality: Right;  . ORIF WRIST FRACTURE Right 1983  . OVARIAN CYST REMOVAL Right   . PATCH ANGIOPLASTY Right 01/31/2016   Procedure: RIGHT COMMON FEMORAL AND PROFUNDA FEMORIS ENDARECTOMY WITH PATCH ANGIOPLASTY;  Surgeon: JMal Misty MD;  [facility]  Service: Vascular;  Laterality: Right;  . PATCH ANGIOPLASTY Right 01/09/2017   Procedure: PATCH ANGIOPLASTY RIGHT POPLITEAL ARTERY BYPASS GRAFT;  Surgeon: CElam Dutch MD;  [facility]  Service: Vascular;  Laterality: Right;  . PERIPHERAL VASCULAR CATHETERIZATION N/A 06/27/2015   Procedure: Lower Extremity Angiography;  Surgeon: JLorretta Harp MD;  [facility];  Service:  Cardiovascular;  Laterality: N/A;  . PERIPHERAL VASCULAR CATHETERIZATION Bilateral 01/29/2016   Procedure: Lower Extremity Angiography;  Surgeon: MWellington Hampshire MD;  [facility];  Service: Cardiovascular;  Laterality: Bilateral;  . PERIPHERAL VASCULAR CATHETERIZATION N/A 01/29/2016   Procedure: Abdominal Aortogram;  Surgeon: MWellington Hampshire MD;  [facility];  Service: Cardiovascular;  Laterality: N/A;  . REFRACTIVE SURGERY Bilateral    "6 in one eye; 7 in the other; to relieve pressure; not glaucoma" (01/28/2016)  . SFA Right 06/27/2015   overlapping Bahn covered stents  . TUBAL LIGATION  1983  . VEIN HARVEST Left 05/17/2017   Procedure: LEFT LEG GREATER SAPHENOUS VEIN HARVEST;  Surgeon: ERosetta Posner MD;  [facility]  Service: Vascular;  Laterality: Left;  .Marland KitchenVEIN REPAIR Right 01/31/2016   Procedure: RIGHT GREATER SAPHENOUS VEIN EXAMNED BUT NOT REMOVED;  Surgeon: JMal Misty MD;  [facility]  Service: Vascular;  Laterality: Right;    There were no vitals filed for this visit.  Subjective Assessment -  12/06/17 0855    Subjective  The patient is not having distal leg discomfort so far today.  She notes wearing schedule is 4-5 hours/day, depending on activity level.  She is using 3 ply socks today.  She feels like she donned her leg well today.  She notes she is doing better getting on/off of the toilet as well.      Pertinent History  right TFA, arthritis, CAD,A-Fib, PVD, CVA, Glaucoma, migraines, HTN, NSTEMI,  knee pain/left torn meniscus,     Patient Stated Goals  To use prosthesis walking. Previously 2-4 miles.     Currently in Pain?  No/denies at this time                      [facility] Adult PT Treatment/Exercise - 12/06/17 2956      Ambulation/Gait   Ambulation/Gait  Yes    Ambulation/Gait Assistance  6: Modified independent (Device/Increase time);4: Min guard;4: Min assist    Ambulation/Gait Assistance Details  Mod indep with  RW.  With RW, tactile cues provided to encourage R anterior translation of pelvis during mid-stance to terminal stance.  With equal step length this should help improve R knee flexion to initiate swing phase.  Patient required CGA to min A with axillary crutches and then min A with loftstrands.  Goal of crutches was to improve upright posture and encourage equal step length.     Ambulation Distance (Feet)  120 Feet x 2 with RW, x 2 with axillary crutches, x 40 ft loftstrand    Assistive device  Crutches;Prosthesis;Rolling walker;Lofstrands    Ambulation Surface  Level;Indoor    Gait Comments  TURNS:  emphasized mechanics and R weight shifting with turning in place to improve function with prosthesis.      Neuro Re-ed    Neuro Re-ed Details   PROSTHETICS:  Weight shifting in parallel bars working on R<>L lateral weight shifting without UE support with CGA working on mechanics of "pulling into" the prosthesis for right weight shift and "push out" into prosthesis for left weight shifting.  Anterior loading with left foot in stride emphasizing upright right hip position. Walking in parallel bars emphasizing hip positioning with tactile cues.       Prosthetics   Current prosthetic wear tolerance (days/week)   daily    Current prosthetic wear tolerance (#hours/day)   5 hours 2x/day    Residual limb condition   patient reports intact; she notes she got the velcro to match up per discussion from prior PT session.               PT Short Term Goals - 11/10/17 1908      PT SHORT TERM GOAL #1   Title  Patient demonstrates proper donning of prosthesis including tightening suspension strap. (All STGs Target Date: 12/09/2017)    Time  1    Status  On-going    Target Date  12/09/17      PT SHORT TERM GOAL #2   Title  Patient tolerates wear of prosthesis >8hrs total/day with skin issues or limb pain.     Time  1    Period  Months    Status  On-going    Target Date  12/09/17      PT SHORT TERM  GOAL #3   Title  Patient standing balance reaches 5" anteriorly without UE support and to ground with UE support with supervision.     Time  1  Period  Months    Status  On-going    Target Date  12/09/17      PT SHORT TERM GOAL #4   Title  Patient ambulates 300' with RW & prosthesis with supervision.     Time  1    Period  Months    Status  On-going    Target Date  12/09/17      PT SHORT TERM GOAL #5   Title  Patient negotiates stairs (2 rails), ramps & curbs with RW & prosthesis with supervision.     Time  1    Period  Months    Status  On-going    Target Date  12/09/17        PT Long Term Goals - 11/10/17 1909      PT LONG TERM GOAL #1   Title  Patient verbalizes & demonstrates proper prosthetic care to enable safe use of prosthesis. (All LTGs Target Date: 03/04/2018)    Time  4    Period  Months    Status  On-going    Target Date  03/04/18      PT LONG TERM GOAL #2   Title  Patient tolerates wear of prosthesis >90% of awake hours without skin issues or limb pain to enable function during her day.     Time  4    Period  Months    Status  On-going    Target Date  03/04/18      PT LONG TERM GOAL #3   Title  Patient ambulates household around furniture carrying items with cane or less & prosthesis modified independent.     Time  4    Period  Months    Status  On-going    Target Date  03/04/18      PT LONG TERM GOAL #4   Title  Berg Balance >/= 45/56 to indicate lower fall risk.     Time  4    Period  Months    Status  On-going    Target Date  03/04/18      PT LONG TERM GOAL #5   Title  Patient ambulates >1000' with LRAD & prosthesis outdoors including grass, ramps & curbs modified independent.     Time  4    Period  Months    Status  On-going    Target Date  03/04/18      PT LONG TERM GOAL #6   Title  Patient performs standing ADLs including cooking, laundry, sweeping, etc with prosthesis & LRAD modified indepedent.     Time  4    Period  Months     Status  On-going    Target Date  03/04/18            Plan - 12/06/17 1335    Clinical Impression Statement  SHORT TERM GOALS DUE NEXT VISIT.  The patient continues with "discomfort" in anterior distal right residual limb improved by use of axillary crutches as compared to RW and loftstrands.  The patient is not loading the right LE fully during stance of gait and turns/ standing activities.  PT to continue to work with fit and contact prosthetist as needed for fit adjustments as increasing R WB may be limited until "discomfort" reduced.  The patient also has R foot toeing out to 45 degrees in prosthesis noted during gait.  This is dec'ing her ability to load her toe at terminal stance to get improved prosthetic knee flexion to initiate  swing.  Will discuss with primary PT to determine if prosthetist aware.     PT Treatment/Interventions  ADLs/Self Care Home Management;DME Instruction;Gait training;Stair training;Functional mobility training;Therapeutic activities;Therapeutic exercise;Balance training;Neuromuscular re-education;Patient/family education;Prosthetic Training    PT Next Visit Plan  CHECK SHORT TERM GOALS DUE 12/20; gait with crutches, right weight shifting activities, stairs, curbs, outdoor ambulation as able.  *instruct on picking up objects from the floor.    Consulted and Agree with Plan of Care  Patient       Patient will benefit from skilled therapeutic intervention in order to improve the following deficits and impairments:  Abnormal gait, Decreased activity tolerance, Decreased balance, Decreased endurance, Decreased knowledge of use of DME, Decreased mobility, Decreased strength, Prosthetic Dependency, Postural dysfunction  Visit Diagnosis: Other abnormalities of gait and mobility  Unsteadiness on feet  Muscle weakness (generalized)     Problem List Patient Active Problem List   Diagnosis Date Noted  . Phantom limb pain ([place])   . Hypertensive crisis   .  Induration of skin   . Abnormality of gait   . Hypoalbuminemia due to protein-calorie malnutrition ([place])   . Amputee, above knee, right ([place]) 05/25/2017  . Unilateral AKA, right ([place])   . Elevated troponin   . PVD (peripheral vascular disease) ([place])   . Benign essential HTN   . Acute blood loss anemia   . Ischemia of right lower extremity 05/17/2017  . Thrombosis of right femoral artery ([place]) 01/09/2017  . Ischemia of extremity 01/09/2017  . History of colonic polyps 07/30/2016  . Essential hypertension 01/08/2016  . Polycythemia vera ([place]) 08/20/2015  . Leukocytosis 07/15/2015  . S/P arterial stent right SFA overlapping Bahn covered stents 06/27/2015 06/28/2015  . Claudication ([place]) 06/27/2015  . Carotid artery disease ([place]) 08/24/2014  . Osteoarthritis of left knee 03/27/2014  . External hemorrhoids 06/06/2013  . PAF (paroxysmal atrial fibrillation), maintaing SR 05/11/2013  . Chronic anticoagulation, new- eliquis 05/11/2013  . CAD (coronary artery disease), per cath 04/27/13, non obstructive disease 20-30% LM; LAD 30%; 50-60% OM2; RCA 40%; EF 65-70% 05/11/2013  . Mitral regurgitation,2+ by cath 05/11/2013  . Atrial fibrillation with RVR, hx of PAF previously 04/27/2013  . PAD (peripheral artery disease), Lt carotid 50-70%: , Hx stent Lt exteral iliac & RSFA stent, known occl. LSFA  04/27/2013  . Hyperlipidemia 04/27/2013  . History of CVA (cerebrovascular accident) 04/27/2013  . NSTEMI (non-ST elevated myocardial infarction) ([place]) 04/27/2013    [place], PT 12/06/2017, 1:40 PM  [facility] [address] [place], [place], [address] Phone: [phone]   Fax:  [phone]  Name: [name] MRN: [id number] Date of Birth: 1951/09/03

## 2017-12-08 ENCOUNTER — Ambulatory Visit: Payer: Medicare Other | Admitting: Physical Therapy

## 2017-12-08 ENCOUNTER — Other Ambulatory Visit (HOSPITAL_COMMUNITY): Payer: Self-pay | Admitting: *Deleted

## 2017-12-09 ENCOUNTER — Ambulatory Visit (HOSPITAL_COMMUNITY)
Admission: RE | Admit: 2017-12-09 | Discharge: 2017-12-09 | Disposition: A | Discharge status: Home / Self Care | Payer: Medicare Other | Source: Ambulatory Visit | Attending: Oncology | Admitting: Oncology

## 2017-12-09 ENCOUNTER — Other Ambulatory Visit (INDEPENDENT_AMBULATORY_CARE_PROVIDER_SITE_OTHER): Payer: Medicare Other

## 2017-12-09 DIAGNOSIS — D45 Polycythemia vera: Secondary | ICD-10-CM

## 2017-12-09 NOTE — Progress Notes (Signed)
Phlebotomized 500cc via left AC. Pt tolerated well.

## 2017-12-10 ENCOUNTER — Other Ambulatory Visit: Payer: Self-pay | Admitting: Oncology

## 2017-12-10 LAB — CBC WITH DIFFERENTIAL/PLATELET
BASOS ABS: 0.3 10*3/uL — AB (ref 0.0–0.2)
BASOS: 1 %
EOS (ABSOLUTE): 0.8 10*3/uL — AB (ref 0.0–0.4)
Eos: 3 %
Hematocrit: 47.2 % — ABNORMAL HIGH (ref 34.0–46.6)
Hemoglobin: 14.8 g/dL (ref 11.1–15.9)
IMMATURE GRANS (ABS): 1 10*3/uL — AB (ref 0.0–0.1)
IMMATURE GRANULOCYTES: 4 %
LYMPHS: 7 %
Lymphocytes Absolute: 1.7 10*3/uL (ref 0.7–3.1)
MCH: 22.2 pg — ABNORMAL LOW (ref 26.6–33.0)
MCHC: 31.4 g/dL — ABNORMAL LOW (ref 31.5–35.7)
MCV: 71 fL — ABNORMAL LOW (ref 79–97)
Monocytes Absolute: 0.5 10*3/uL (ref 0.1–0.9)
Monocytes: 2 %
NEUTROS PCT: 83 %
Neutrophils Absolute: 22.2 10*3/uL — ABNORMAL HIGH (ref 1.4–7.0)
PLATELETS: 384 10*3/uL — AB (ref 150–379)
RBC: 6.68 x10E6/uL — AB (ref 3.77–5.28)
RDW: 19.1 % — ABNORMAL HIGH (ref 12.3–15.4)
WBC: 26.5 10*3/uL — AB (ref 3.4–10.8)

## 2017-12-15 ENCOUNTER — Encounter: Payer: Self-pay | Admitting: Physical Therapy

## 2017-12-15 ENCOUNTER — Ambulatory Visit: Payer: Medicare Other | Admitting: Physical Therapy

## 2017-12-15 DIAGNOSIS — M6281 Muscle weakness (generalized): Secondary | ICD-10-CM

## 2017-12-15 DIAGNOSIS — R2681 Unsteadiness on feet: Secondary | ICD-10-CM

## 2017-12-15 DIAGNOSIS — R2689 Other abnormalities of gait and mobility: Secondary | ICD-10-CM

## 2017-12-15 NOTE — Patient Instructions (Addendum)
Hip Flexor Stretch    Lying on back near edge of couch, bend one leg, foot flat. Hang other leg over edge, relaxed, thigh resting entirely on bed for __10-20__ minutes. Do __1-3__ sessions per day.   http://gt2.exer.us/347   Copyright  VHI. All rights reserved.  HIP: Flexors - Supine    Lie on edge of surface. Bring left knee toward chest. Place prosthesis off the surface. Hold _5-10__ seconds to relax muscle, then squeeze right buttocks pressing prosthetic foot into floor Hold 5 secs. Repeat _3__ reps per set, Return prosthesis to couch.  Perform 3-5 times placing foot on floor with tightening buttocks 3 times.    Copyright  VHI. All rights reserved.

## 2017-12-15 NOTE — Therapy (Signed)
[facility] [address] [place], [place], [address] Phone: [phone]   Fax:  [phone]  Physical Therapy Treatment  Patient Details  Name: [name] MRN: [id number] Date of Birth: 1951/05/03 Referring Provider: Jamse Arn, MD   Encounter Date: 12/15/2017  PT End of Session - 12/15/17 1222    Visit Number  9    Number of Visits  34    Date for PT Re-Evaluation  01/07/18    Authorization Type  Medicare G-code & progress note every 10th visit    PT Start Time  0835    PT Stop Time  0930    PT Time Calculation (min)  55 min    Equipment Utilized During Treatment  Gait belt    Activity Tolerance  Patient tolerated treatment well    Behavior During Therapy  [facility] for tasks assessed/performed       Past Medical History:  Diagnosis Date  . Arthritis    "fingers, some in my knees" (01/28/2016)  . CAD (coronary artery disease), per cath 04/27/13, non obstructive disease, EF 65-70% 05/11/2013  . Carotid disease, bilateral ([place]) 09/2013   Lt carotid 50-69% stentosis, less on rt  . Dysrhythmia   . Fatty liver   . GERD (gastroesophageal reflux disease)   . Glaucoma   . H/O cardiovascular stress test 12/27/2009   negative for ischemia  . Headache    "occasional since eye pressure regulated" (01/28/2016)  . Hyperlipidemia   . Hypertension   . Leukocytosis 07/15/2015  . Migraine    "stopped w/laser holes to relieve pressure in my eyes; had them 3-4 times/wk before taht" (01/28/2016)  . Mitral regurgitation,2+ by cath 05/11/2013  . NSTEMI (non-ST elevated myocardial infarction) ([place]) 04/27/2013  . Pain    BOTH KNEES - PT HAS TORN MENISCUS LEFT KNEE  . Paroxysmal atrial fibrillation (HCC)    hx of a fib on ASA  AND ELIQUIS   . Polycythemia vera ([place]) 08/20/2015   JAK-2 positive 07/19/15  . PVD (peripheral vascular disease) with claudication ([place]) 07/2006   previous L ext iliac stent and rt SFA stent, known occluded Lt SFA   . S/P cardiac cath 04/27/13   NON OBSTRUCTIVE DISEASE, 2+ mr, ef 65-70%  . Statin intolerance   . Stroke ([place]) 2006   SWELLING ALL OVER AND RT SIDE OF FACE DRAWN AND SPEECH SLURRED AND NUMBNESS ON RIGHT SIDE-- ALL RESOLVED    Past Surgical History:  Procedure Laterality Date  . AMPUTATION Right 05/21/2017   Procedure: AMPUTATION ABOVE KNEE;  Surgeon: Rosetta Posner, MD;  Location: [facility];  Service: Vascular;  Laterality: Right;  . CARDIAC CATHETERIZATION  04/27/13   non occlusive disease with mild to mod. calcified lesions in ostial LM and proximal LAD and moderate prox. RC AND DISTAL AV GROOVE LCX, ef  . CATARACT EXTRACTION W/PHACO Right 03/21/2015   Procedure: CATARACT EXTRACTION PHACO AND INTRAOCULAR LENS PLACEMENT RIGHT EYE;  Surgeon: Baruch Goldmann, MD;  [facility];  Service: Ophthalmology;  Laterality: Right;  CDE:5.10  . CATARACT EXTRACTION W/PHACO Left 05/16/2015   Procedure: CATARACT EXTRACTION PHACO AND INTRAOCULAR LENS PLACEMENT (IOC);  Surgeon: Baruch Goldmann, MD;  [facility]: [facility];  Service: Ophthalmology;  Laterality: Left;  CDE 5.57  . [place]  . COLONOSCOPY N/A 09/11/2016   Procedure: COLONOSCOPY;  Surgeon: Rogene Houston, MD;  [facility];  Service: Endoscopy;  Laterality: N/A;  730-moved to 1:00 Ann notified pt  .  EMBOLECTOMY Right 02/01/2016   Procedure: Thrombectomy of Right Femoral-Popliteal Bypass Graft; Endarterectomy of Right Below Knee Popliteal Artery and Tibial Peroneal Trunk with Bovine Pericardium Patch Angioplasty.;  Surgeon: Angelia Mould, MD;  Location: [facility];  Service: Vascular;  Laterality: Right;  . FEMORAL-POPLITEAL BYPASS GRAFT Right 01/31/2016   Procedure: BYPASS GRAFT RIGHT COMMON  FEMORALTO BELOW KNEE POPLITEAL ARTERY BYPASS GRAFT USING 6MM PROPATEN GORTEX GRAFT;  Surgeon: Mal Misty, MD;  Location: [facility];  Service: Vascular;  Laterality: Right;  . FEMORAL-POPLITEAL BYPASS GRAFT Right 01/09/2017   Procedure: THROMBECTOMY  OF RIGHT FEMORAL-POPLITEAL  ARTERY BYPASS GRAFT; THROMBECTOMY RIGHT  TIBIAL VESSELS;  Surgeon: [name], MD;  Location: [facility];  Service: Vascular;  Laterality: Right;  . FEMORAL-POPLITEAL BYPASS GRAFT Right 05/17/2017   Procedure: RIGHT FEMORAL-TIBIAL PERONEAL TRUNK ARTERY BYPASS GRAFT USING 54mX80cm PROPATEN GRAFT WITH REMOVABLE RING;  Surgeon: ERosetta Posner MD;  [facility]  Service: Vascular;  Laterality: Right;  . FRACTURE SURGERY    . ILIAC ARTERY STENT  07/2006   external iliac and Rt SFA stent 07/2006 AND THE LEFT WAS IN 2006  . KNEE ARTHROSCOPY WITH MEDIAL MENISECTOMY Left 03/27/2014   Procedure: left knee arthorscopy with medial chondraplasty of the medial femoral and patella, medial microfracture technique of medial femoral condyl;  Surgeon: RTobi Bastos MD;  [facility];  Service: Orthopedics;  Laterality: Left;  . LEFT HEART CATHETERIZATION WITH CORONARY ANGIOGRAM Right 04/27/2013   Procedure: LEFT HEART CATHETERIZATION WITH CORONARY ANGIOGRAM;  Surgeon: DLeonie Man MD;  [facility];  Service: Cardiovascular;  Laterality: Right;  . LOWER EXTREMITY ANGIOGRAM Right 01/31/2016   Procedure: INTRAOP RIGHT LOWER EXTREMITY ANGIOGRAM;  Surgeon: JMal Misty MD;  [facility]  Service: Vascular;  Laterality: Right;  . ORIF WRIST FRACTURE Right 1983  . OVARIAN CYST REMOVAL Right   . PATCH ANGIOPLASTY Right 01/31/2016   Procedure: RIGHT COMMON FEMORAL AND PROFUNDA FEMORIS ENDARECTOMY WITH PATCH ANGIOPLASTY;  Surgeon: JMal Misty MD;  [facility]  Service: Vascular;  Laterality: Right;  . PATCH ANGIOPLASTY Right 01/09/2017   Procedure: PATCH ANGIOPLASTY RIGHT POPLITEAL ARTERY BYPASS GRAFT;  Surgeon: CElam Dutch MD;  [facility]  Service: Vascular;  Laterality: Right;  . PERIPHERAL VASCULAR CATHETERIZATION N/A 06/27/2015   Procedure: Lower Extremity Angiography;  Surgeon: JLorretta Harp MD;  [facility];  Service:  Cardiovascular;  Laterality: N/A;  . PERIPHERAL VASCULAR CATHETERIZATION Bilateral 01/29/2016   Procedure: Lower Extremity Angiography;  Surgeon: MWellington Hampshire MD;  [facility];  Service: Cardiovascular;  Laterality: Bilateral;  . PERIPHERAL VASCULAR CATHETERIZATION N/A 01/29/2016   Procedure: Abdominal Aortogram;  Surgeon: MWellington Hampshire MD;  [facility];  Service: Cardiovascular;  Laterality: N/A;  . REFRACTIVE SURGERY Bilateral    "6 in one eye; 7 in the other; to relieve pressure; not glaucoma" (01/28/2016)  . SFA Right 06/27/2015   overlapping Bahn covered stents  . TUBAL LIGATION  1983  . VEIN HARVEST Left 05/17/2017   Procedure: LEFT LEG GREATER SAPHENOUS VEIN HARVEST;  Surgeon: ERosetta Posner MD;  [facility]  Service: Vascular;  Laterality: Left;  .Marland KitchenVEIN REPAIR Right 01/31/2016   Procedure: RIGHT GREATER SAPHENOUS VEIN EXAMNED BUT NOT REMOVED;  Surgeon: JMal Misty MD;  [facility]  Service: Vascular;  Laterality: Right;    There were no vitals filed for this visit.  Subjective Assessment -  12/15/17 0835    Subjective  No falls but one near fall picking up something from floor. She is wearing prosthesis 4-5hrs 2x/wk     Pertinent History  right TFA, arthritis, CAD,A-Fib, PVD, CVA, Glaucoma, migraines, HTN, NSTEMI,  knee pain/left torn meniscus,     Limitations  Lifting;Standing;Walking    Patient Stated Goals  To use prosthesis walking. Previously 2-4 miles.     Currently in Pain?  No/denies                      [facility] Adult PT Treatment/Exercise - 12/15/17 0835      Transfers   Transfers  Sit to Stand;Stand to Sit;Floor to Transfer    Sit to Stand  5: Supervision;With upper extremity assist;With armrests;From chair/3-in-1 to crutches    Stand to Sit  5: Supervision;With upper extremity assist;With armrests;To chair/3-in-1 from crutches    Floor to Transfer  4: Min assist;With upper extremity assist UE assist on  chair bottom    Floor to Transfer Details (indicate cue type and reason)  PT demo, instructed technique with TFA prosthesis. Pt performed with minA & verbal/manual cues.       Ambulation/Gait   Ambulation/Gait  Yes    Ambulation/Gait Assistance  5: Supervision    Ambulation/Gait Assistance Details  verbal cues on sequence (4pt) occasional, verbal cues on grass /gravel negotiation with prsothesis,     Ambulation Distance (Feet)  300 Feet 300' with RW mod ind, 300' axillary crutches & 100' forearm     Assistive device  Crutches;Prosthesis;Rolling walker;Lofstrands    Ambulation Surface  Indoor;Level;Outdoor;Paved;Gravel;Grass    Stairs  Yes    Stairs Assistance  5: Supervision    Stairs Assistance Details (indicate cue type and reason)  verbal cues on wt shift over prosthesis in stance    Stair Management Technique  One rail Right;With crutches;Step to pattern;Forwards    Number of Stairs  4    Door Management  4: Min assist;5: Supervision crutches & prosthesis    Door Managment Details (indicate cue type and reason)  verbal cues on automatic closing doors     Ramp  5: Supervision axillary crutches & prosthesis    Ramp Details (indicate cue type and reason)  verbal cues on posture, sequence & wt shift    Curb  5: Supervision axillary crutches & prosthesis    Curb Details (indicate cue type and reason)  verbal cues on sequence & prosthesis control    Gait Comments  --      Therapeutic Activites    Therapeutic Activities  ADL's;Lifting    ADL's  Stands without UE support with support surface close if needed: donnes / doffes coat and reaches 5" forward.      Lifting  PT demo picking up objects from floor with TFA prosthesis: Pt able to pick up & place light wt object on floor with RW support with supervision.       Neuro Re-ed    Neuro Re-ed Details   --      Prosthetics   Prosthetic Care Comments   need for hip flexor stretch & how tightness can be related to her distal femur discomfort.      Current prosthetic wear tolerance (days/week)   daily    Current prosthetic wear tolerance (#hours/day)   increase to 6 hours 2x/day initiating first upon arising 4-5hrs 2x/day    Residual limb condition   patient reports intact    Education Provided  Proper  wear schedule/adjustment    Person(s) Educated  Patient    Education Method  Explanation;Verbal cues    Education Method  Verbalized understanding             PT Education - 12/15/17 0900    Education provided  Yes    Education Details  hip flexor stretch & glut set     Person(s) Educated  Patient    Methods  Explanation;Demonstration;Verbal cues    Comprehension  Verbalized understanding;Need further instruction;Returned demonstration       PT Short Term Goals - 12/15/17 1223      PT SHORT TERM GOAL #1   Title  Patient demonstrates proper donning of prosthesis including tightening suspension strap. (All STGs Target Date: 12/09/2017)    Baseline  MET 12/15/2017    Time  1    Status  Achieved      PT SHORT TERM GOAL #2   Title  Patient tolerates wear of prosthesis >8hrs total/day with skin issues or limb pain.     Baseline  MET 12/15/2017    Time  1    Period  Months    Status  Achieved      PT SHORT TERM GOAL #3   Title  Patient standing balance reaches 5" anteriorly without UE support and to ground with UE support with supervision.     Baseline  MET 12/15/2017    Time  1    Period  Months    Status  Achieved      PT SHORT TERM GOAL #4   Title  Patient ambulates 300' with RW & prosthesis with supervision.     Baseline  MET 12/15/2017    Time  1    Period  Months    Status  Achieved      PT SHORT TERM GOAL #5   Title  Patient negotiates stairs (2 rails), ramps & curbs with RW & prosthesis with supervision.     Baseline  MET 12/15/2017    Time  1    Period  Months    Status  Achieved         12/15/17 1400  PT SHORT TERM GOAL #1  Title Patient verbalizes proper ply sock adjustment for volume  changes. (All STGs Target Date: 01/07/2018)  Time 4  Period Weeks  Target Date 01/08/18  PT SHORT TERM GOAL #2  Title Patient tolerates wear of prosthesis >12hrs total /day without skin issues.   Time 4  Period Weeks  Status New  Target Date 01/08/18  PT SHORT TERM GOAL #3  Title Patient performs standing activities with intermittent UE support for minutes, reaches 7" without UE support with supervision.   Time 4  Period Weeks  Status New  Target Date 01/08/18  PT SHORT TERM GOAL #4  Title Patient ambulates 500' outdoors including grass with crutches & prosthesis with supervision.   Time 4  Period Weeks  Status New  Target Date 01/08/18  PT SHORT TERM GOAL #5  Title Patient negotiates stairs (1 rails), ramps & curbs with crutches & prosthesis with supervision.   Time 4  Period Weeks  Status New  Target Date 01/08/18    PT Long Term Goals - 11/10/17 1909      PT LONG TERM GOAL #1   Title  Patient verbalizes & demonstrates proper prosthetic care to enable safe use of prosthesis. (All LTGs Target Date: 03/04/2018)    Time  4    Period  Months  Status  On-going    Target Date  03/04/18      PT LONG TERM GOAL #2   Title  Patient tolerates wear of prosthesis >90% of awake hours without skin issues or limb pain to enable function during her day.     Time  4    Period  Months    Status  On-going    Target Date  03/04/18      PT LONG TERM GOAL #3   Title  Patient ambulates household around furniture carrying items with cane or less & prosthesis modified independent.     Time  4    Period  Months    Status  On-going    Target Date  03/04/18      PT LONG TERM GOAL #4   Title  Berg Balance >/= 45/56 to indicate lower fall risk.     Time  4    Period  Months    Status  On-going    Target Date  03/04/18      PT LONG TERM GOAL #5   Title  Patient ambulates >1000' with LRAD & prosthesis outdoors including grass, ramps & curbs modified independent.     Time  4    Period   Months    Status  On-going    Target Date  03/04/18      PT LONG TERM GOAL #6   Title  Patient performs standing ADLs including cooking, laundry, sweeping, etc with prosthesis & LRAD modified indepedent.     Time  4    Period  Months    Status  On-going    Target Date  03/04/18            Plan - 12/15/17 1224    Clinical Impression Statement  Patient met all STGs set for first 30days of plan of care. She is functioning with RW & prosthesis at basic community level. She was able to self-correct 2 balance losses when ambulating with axillary crutches. Patient was able to ambulate on grass with crutches & prosthesis with skilled instructions.      PT Treatment/Interventions  ADLs/Self Care Home Management;DME Instruction;Gait training;Stair training;Functional mobility training;Therapeutic activities;Therapeutic exercise;Balance training;Neuromuscular re-education;Patient/family education;Prosthetic Training    PT Next Visit Plan  G-Code with prosthetic wear & care;  gait with crutches, right weight shifting activities, stairs, curbs, outdoor ambulation as able    Consulted and Agree with Plan of Care  Patient       Patient will benefit from skilled therapeutic intervention in order to improve the following deficits and impairments:  Abnormal gait, Decreased activity tolerance, Decreased balance, Decreased endurance, Decreased knowledge of use of DME, Decreased mobility, Decreased strength, Prosthetic Dependency, Postural dysfunction  Visit Diagnosis: Other abnormalities of gait and mobility  Unsteadiness on feet  Muscle weakness (generalized)     Problem List Patient Active Problem List   Diagnosis Date Noted  . Phantom limb pain ([place])   . Hypertensive crisis   . Induration of skin   . Abnormality of gait   . Hypoalbuminemia due to protein-calorie malnutrition ([place])   . Amputee, above knee, right ([place]) 05/25/2017  . Unilateral AKA, right ([place])   . Elevated troponin   .  PVD (peripheral vascular disease) ([place])   . Benign essential HTN   . Acute blood loss anemia   . Ischemia of right lower extremity 05/17/2017  . Thrombosis of right femoral artery ([place]) 01/09/2017  . Ischemia of extremity 01/09/2017  . History  of colonic polyps 07/30/2016  . Essential hypertension 01/08/2016  . Polycythemia vera ([place]) 08/20/2015  . Leukocytosis 07/15/2015  . S/P arterial stent right SFA overlapping Bahn covered stents 06/27/2015 06/28/2015  . Claudication ([place]) 06/27/2015  . Carotid artery disease ([place]) 08/24/2014  . Osteoarthritis of left knee 03/27/2014  . External hemorrhoids 06/06/2013  . PAF (paroxysmal atrial fibrillation), maintaing SR 05/11/2013  . Chronic anticoagulation, new- eliquis 05/11/2013  . CAD (coronary artery disease), per cath 04/27/13, non obstructive disease 20-30% LM; LAD 30%; 50-60% OM2; RCA 40%; EF 65-70% 05/11/2013  . Mitral regurgitation,2+ by cath 05/11/2013  . Atrial fibrillation with RVR, hx of PAF previously 04/27/2013  . PAD (peripheral artery disease), Lt carotid 50-70%: , Hx stent Lt exteral iliac & RSFA stent, known occl. LSFA  04/27/2013  . Hyperlipidemia 04/27/2013  . History of CVA (cerebrovascular accident) 04/27/2013  . NSTEMI (non-ST elevated myocardial infarction) ([place]) 04/27/2013    [name] PT, DPT 12/15/2017, 12:29 PM  [facility] [address] [place], [place], [address] Phone: [phone]   Fax:  [phone]  Name: JAMELAH SITZER MRN: [id number] Date of Birth: 30-Sep-1951

## 2017-12-17 ENCOUNTER — Encounter: Payer: Self-pay | Admitting: Physical Therapy

## 2017-12-17 ENCOUNTER — Ambulatory Visit: Payer: Medicare Other | Admitting: Physical Therapy

## 2017-12-17 DIAGNOSIS — R2689 Other abnormalities of gait and mobility: Secondary | ICD-10-CM

## 2017-12-17 DIAGNOSIS — R2681 Unsteadiness on feet: Secondary | ICD-10-CM

## 2017-12-17 DIAGNOSIS — M6281 Muscle weakness (generalized): Secondary | ICD-10-CM

## 2017-12-17 DIAGNOSIS — R296 Repeated falls: Secondary | ICD-10-CM

## 2017-12-17 NOTE — Therapy (Addendum)
[facility] [address] [place], [place], [address] Phone: [phone]   Fax:  [phone]  Physical Therapy Treatment  Patient Details  Name: [name] MRN: [id number] Date of Birth: 1951/06/24 Referring Provider: Jamse Arn, MD   Encounter Date: 12/17/2017  PT End of Session - 12/17/17 0858    Visit Number  10    Number of Visits  34    Date for PT Re-Evaluation  01/07/18    Authorization Type  Medicare G-code & progress note every 10th visit    PT Start Time  0849    PT Stop Time  0929    PT Time Calculation (min)  40 min    Equipment Utilized During Treatment  Gait belt    Activity Tolerance  Patient tolerated treatment well    Behavior During Therapy  [facility] for tasks assessed/performed       Past Medical History:  Diagnosis Date  . Arthritis    "fingers, some in my knees" (01/28/2016)  . CAD (coronary artery disease), per cath 04/27/13, non obstructive disease, EF 65-70% 05/11/2013  . Carotid disease, bilateral ([place]) 09/2013   Lt carotid 50-69% stentosis, less on rt  . Dysrhythmia   . Fatty liver   . GERD (gastroesophageal reflux disease)   . Glaucoma   . H/O cardiovascular stress test 12/27/2009   negative for ischemia  . Headache    "occasional since eye pressure regulated" (01/28/2016)  . Hyperlipidemia   . Hypertension   . Leukocytosis 07/15/2015  . Migraine    "stopped w/laser holes to relieve pressure in my eyes; had them 3-4 times/wk before taht" (01/28/2016)  . Mitral regurgitation,2+ by cath 05/11/2013  . NSTEMI (non-ST elevated myocardial infarction) ([place]) 04/27/2013  . Pain    BOTH KNEES - PT HAS TORN MENISCUS LEFT KNEE  . Paroxysmal atrial fibrillation (HCC)    hx of a fib on ASA  AND ELIQUIS   . Polycythemia vera ([place]) 08/20/2015   JAK-2 positive 07/19/15  . PVD (peripheral vascular disease) with claudication ([place]) 07/2006   previous L ext iliac stent and rt SFA stent, known occluded Lt SFA   . S/P cardiac cath 04/27/13   NON OBSTRUCTIVE DISEASE, 2+ mr, ef 65-70%  . Statin intolerance   . Stroke ([place]) 2006   SWELLING ALL OVER AND RT SIDE OF FACE DRAWN AND SPEECH SLURRED AND NUMBNESS ON RIGHT SIDE-- ALL RESOLVED    Past Surgical History:  Procedure Laterality Date  . AMPUTATION Right 05/21/2017   Procedure: AMPUTATION ABOVE KNEE;  Surgeon: Rosetta Posner, MD;  Location: [facility];  Service: Vascular;  Laterality: Right;  . CARDIAC CATHETERIZATION  04/27/13   non occlusive disease with mild to mod. calcified lesions in ostial LM and proximal LAD and moderate prox. RC AND DISTAL AV GROOVE LCX, ef  . CATARACT EXTRACTION W/PHACO Right 03/21/2015   Procedure: CATARACT EXTRACTION PHACO AND INTRAOCULAR LENS PLACEMENT RIGHT EYE;  Surgeon: Baruch Goldmann, MD;  [facility]: [facility];  Service: Ophthalmology;  Laterality: Right;  CDE:5.10  . CATARACT EXTRACTION W/PHACO Left 05/16/2015   Procedure: CATARACT EXTRACTION PHACO AND INTRAOCULAR LENS PLACEMENT (IOC);  Surgeon: Baruch Goldmann, MD;  Location: [facility];  Service: Ophthalmology;  Laterality: Left;  CDE 5.57  . [place]  . COLONOSCOPY N/A 09/11/2016   Procedure: COLONOSCOPY;  Surgeon: Rogene Houston, MD;  [facility];  Service: Endoscopy;  Laterality: N/A;  730-moved to 1:00 Ann notified pt  .  EMBOLECTOMY Right 02/01/2016   Procedure: Thrombectomy of Right Femoral-Popliteal Bypass Graft; Endarterectomy of Right Below Knee Popliteal Artery and Tibial Peroneal Trunk with Bovine Pericardium Patch Angioplasty.;  Surgeon: Angelia Mould, MD;  Location: [facility];  Service: Vascular;  Laterality: Right;  . FEMORAL-POPLITEAL BYPASS GRAFT Right 01/31/2016   Procedure: BYPASS GRAFT RIGHT COMMON  FEMORALTO BELOW KNEE POPLITEAL ARTERY BYPASS GRAFT USING 6MM PROPATEN GORTEX GRAFT;  Surgeon: Mal Misty, MD;  Location: [facility];  Service: Vascular;  Laterality: Right;  . FEMORAL-POPLITEAL BYPASS GRAFT Right 01/09/2017   Procedure: THROMBECTOMY  OF RIGHT FEMORAL-POPLITEAL  ARTERY BYPASS GRAFT; THROMBECTOMY RIGHT  TIBIAL VESSELS;  Surgeon: [name], MD;  Location: [facility];  Service: Vascular;  Laterality: Right;  . FEMORAL-POPLITEAL BYPASS GRAFT Right 05/17/2017   Procedure: RIGHT FEMORAL-TIBIAL PERONEAL TRUNK ARTERY BYPASS GRAFT USING 54mX80cm PROPATEN GRAFT WITH REMOVABLE RING;  Surgeon: ERosetta Posner MD;  [facility]  Service: Vascular;  Laterality: Right;  . FRACTURE SURGERY    . ILIAC ARTERY STENT  07/2006   external iliac and Rt SFA stent 07/2006 AND THE LEFT WAS IN 2006  . KNEE ARTHROSCOPY WITH MEDIAL MENISECTOMY Left 03/27/2014   Procedure: left knee arthorscopy with medial chondraplasty of the medial femoral and patella, medial microfracture technique of medial femoral condyl;  Surgeon: RTobi Bastos MD;  [facility];  Service: Orthopedics;  Laterality: Left;  . LEFT HEART CATHETERIZATION WITH CORONARY ANGIOGRAM Right 04/27/2013   Procedure: LEFT HEART CATHETERIZATION WITH CORONARY ANGIOGRAM;  Surgeon: DLeonie Man MD;  [facility];  Service: Cardiovascular;  Laterality: Right;  . LOWER EXTREMITY ANGIOGRAM Right 01/31/2016   Procedure: INTRAOP RIGHT LOWER EXTREMITY ANGIOGRAM;  Surgeon: JMal Misty MD;  [facility]  Service: Vascular;  Laterality: Right;  . ORIF WRIST FRACTURE Right 1983  . OVARIAN CYST REMOVAL Right   . PATCH ANGIOPLASTY Right 01/31/2016   Procedure: RIGHT COMMON FEMORAL AND PROFUNDA FEMORIS ENDARECTOMY WITH PATCH ANGIOPLASTY;  Surgeon: JMal Misty MD;  [facility]  Service: Vascular;  Laterality: Right;  . PATCH ANGIOPLASTY Right 01/09/2017   Procedure: PATCH ANGIOPLASTY RIGHT POPLITEAL ARTERY BYPASS GRAFT;  Surgeon: CElam Dutch MD;  [facility]  Service: Vascular;  Laterality: Right;  . PERIPHERAL VASCULAR CATHETERIZATION N/A 06/27/2015   Procedure: Lower Extremity Angiography;  Surgeon: JLorretta Harp MD;  [facility];  Service:  Cardiovascular;  Laterality: N/A;  . PERIPHERAL VASCULAR CATHETERIZATION Bilateral 01/29/2016   Procedure: Lower Extremity Angiography;  Surgeon: MWellington Hampshire MD;  [facility];  Service: Cardiovascular;  Laterality: Bilateral;  . PERIPHERAL VASCULAR CATHETERIZATION N/A 01/29/2016   Procedure: Abdominal Aortogram;  Surgeon: MWellington Hampshire MD;  [facility];  Service: Cardiovascular;  Laterality: N/A;  . REFRACTIVE SURGERY Bilateral    "6 in one eye; 7 in the other; to relieve pressure; not glaucoma" (01/28/2016)  . SFA Right 06/27/2015   overlapping Bahn covered stents  . TUBAL LIGATION  1983  . VEIN HARVEST Left 05/17/2017   Procedure: LEFT LEG GREATER SAPHENOUS VEIN HARVEST;  Surgeon: ERosetta Posner MD;  [facility]  Service: Vascular;  Laterality: Left;  .Marland KitchenVEIN REPAIR Right 01/31/2016   Procedure: RIGHT GREATER SAPHENOUS VEIN EXAMNED BUT NOT REMOVED;  Surgeon: JMal Misty MD;  [facility]  Service: Vascular;  Laterality: Right;    There were no vitals filed for this visit.  Subjective Assessment -  12/17/17 0855    Subjective  No new complaints. No falls to report. Still having some pain at distal end of limb, has been doing the stretches at home. Thinks her prosthesis is rotated.    Pertinent History  right TFA, arthritis, CAD,A-Fib, PVD, CVA, Glaucoma, migraines, HTN, NSTEMI,  knee pain/left torn meniscus,     Limitations  Lifting;Standing;Walking    Patient Stated Goals  To use prosthesis walking. Previously 2-4 miles.     Currently in Pain?  No/denies    Pain Score  0-No pain               [facility] Adult PT Treatment/Exercise - 12/17/17 0858      Transfers   Transfers  Sit to Stand;Stand to Sit    Sit to Stand  5: Supervision;With upper extremity assist;With armrests;From chair/3-in-1    Sit to Stand Details  Verbal cues for safe use of DME/AE;Verbal cues for sequencing    Sit to Stand Details (indicate cue type and reason)   with use of forearm crutches    Stand to Sit  5: Supervision;With upper extremity assist;With armrests;To chair/3-in-1    Stand to Sit Details (indicate cue type and reason)  Verbal cues for sequencing;Verbal cues for safe use of DME/AE    Stand to Sit Details  with use of forearm crutches      Ambulation/Gait   Ambulation/Gait  Yes    Ambulation/Gait Assistance  4: Min assist;4: Min guard    Ambulation/Gait Assistance Details  cues for 4 point gait pattern, posture, crutch placement and step length    Ambulation Distance (Feet)  130 Feet x1, plus around gym     Assistive device  Lofstrands;Prosthesis    Gait Pattern  Step-through pattern;Decreased step length - left;Decreased stance time - right;Decreased stride length    Ambulation Surface  Level;Indoor      High Level Balance   High Level Balance Activities  Side stepping;Backward walking    High Level Balance Comments  in parallel bars: 3-4 laps each with emphasis on tall posture, decreased UE weight bearing and ex form/technique.       Prosthetics   Current prosthetic wear tolerance (days/week)   daily    Current prosthetic wear tolerance (#hours/day)   5-6 hours 2x day, working  toward 6 hours    Residual limb condition   patient reports intact    Education Provided  Proper wear schedule/adjustment;Proper weight-bearing schedule/adjustment;Residual limb care    Person(s) Educated  Patient    Education Method  Explanation;Demonstration;Verbal cues    Education Method  Verbalized understanding;Verbal cues required;Needs further instruction    Donning Prosthesis  Supervision    Doffing Prosthesis  Modified independent (device/increased time)           PT Short Term Goals - 12/15/17 1400      PT SHORT TERM GOAL #1   Title  Patient verbalizes proper ply sock adjustment for volume changes. (All STGs Target Date: 01/07/2018)    Time  4    Period  Weeks    Target Date  01/08/18      PT SHORT TERM GOAL #2   Title  Patient  tolerates wear of prosthesis >12hrs total /day without skin issues.     Time  4    Period  Weeks    Status  New    Target Date  01/08/18      PT SHORT TERM GOAL #3   Title  Patient performs standing activities  with intermittent UE support for minutes, reaches 7" without UE support with supervision.     Time  4    Period  Weeks    Status  New    Target Date  01/08/18      PT SHORT TERM GOAL #4   Title  Patient ambulates 500' outdoors including grass with crutches & prosthesis with supervision.     Time  4    Period  Weeks    Status  New    Target Date  01/08/18      PT SHORT TERM GOAL #5   Title  Patient negotiates stairs (1 rails), ramps & curbs with crutches & prosthesis with supervision.     Time  4    Period  Weeks    Status  New    Target Date  01/08/18        PT Long Term Goals - 11/10/17 1909      PT LONG TERM GOAL #1   Title  Patient verbalizes & demonstrates proper prosthetic care to enable safe use of prosthesis. (All LTGs Target Date: 03/04/2018)    Time  4    Period  Months    Status  On-going    Target Date  03/04/18      PT LONG TERM GOAL #2   Title  Patient tolerates wear of prosthesis >90% of awake hours without skin issues or limb pain to enable function during her day.     Time  4    Period  Months    Status  On-going    Target Date  03/04/18      PT LONG TERM GOAL #3   Title  Patient ambulates household around furniture carrying items with cane or less & prosthesis modified independent.     Time  4    Period  Months    Status  On-going    Target Date  03/04/18      PT LONG TERM GOAL #4   Title  Berg Balance >/= 45/56 to indicate lower fall risk.     Time  4    Period  Months    Status  On-going    Target Date  03/04/18      PT LONG TERM GOAL #5   Title  Patient ambulates >1000' with LRAD & prosthesis outdoors including grass, ramps & curbs modified independent.     Time  4    Period  Months    Status  On-going    Target Date  03/04/18       PT LONG TERM GOAL #6   Title  Patient performs standing ADLs including cooking, laundry, sweeping, etc with prosthesis & LRAD modified indepedent.     Time  4    Period  Months    Status  On-going    Target Date  03/04/18            Plan - 12/17/17 0858    Clinical Impression Statement  Today's skilled session continued to focus on gait with forearm crutches and balance activities with emphasis on weight shifting onto prosthesis. Pt is progressing toward goals and should benefit from continued PT to progress toward unmet goals.     PT Treatment/Interventions  ADLs/Self Care Home Management;DME Instruction;Gait training;Stair training;Functional mobility training;Therapeutic activities;Therapeutic exercise;Balance training;Neuromuscular re-education;Patient/family education;Prosthetic Training    PT Next Visit Plan  gait with crutches, right weight shifting activities, stairs, curbs, outdoor ambulation as able    Consulted and Agree  with Plan of Care  Patient       Patient will benefit from skilled therapeutic intervention in order to improve the following deficits and impairments:  Abnormal gait, Decreased activity tolerance, Decreased balance, Decreased endurance, Decreased knowledge of use of DME, Decreased mobility, Decreased strength, Prosthetic Dependency, Postural dysfunction  Visit Diagnosis: Other abnormalities of gait and mobility  Unsteadiness on feet  Muscle weakness (generalized)  Repeated falls   G-Codes - Dec 30, 2017 1428    Functional Assessment Tool Used (Outpatient Only)  pt is able to don/doff prosthesis independently, wearing up to 5-6 hours 2x day every day. needs cues on concepts new with prosthetic care    Functional Limitation  Self care       Problem List Patient Active Problem List   Diagnosis Date Noted  . Phantom limb pain ([place])   . Hypertensive crisis   . Induration of skin   . Abnormality of gait   . Hypoalbuminemia due to protein-calorie  malnutrition ([place])   . Amputee, above knee, right ([place]) 05/25/2017  . Unilateral AKA, right ([place])   . Elevated troponin   . PVD (peripheral vascular disease) ([place])   . Benign essential HTN   . Acute blood loss anemia   . Ischemia of right lower extremity 05/17/2017  . Thrombosis of right femoral artery ([place]) 01/09/2017  . Ischemia of extremity 01/09/2017  . History of colonic polyps 07/30/2016  . Essential hypertension 01/08/2016  . Polycythemia vera ([place]) 08/20/2015  . Leukocytosis 07/15/2015  . S/P arterial stent right SFA overlapping Bahn covered stents 06/27/2015 06/28/2015  . Claudication ([place]) 06/27/2015  . Carotid artery disease ([place]) 08/24/2014  . Osteoarthritis of left knee 03/27/2014  . External hemorrhoids 06/06/2013  . PAF (paroxysmal atrial fibrillation), maintaing SR 05/11/2013  . Chronic anticoagulation, new- eliquis 05/11/2013  . CAD (coronary artery disease), per cath 04/27/13, non obstructive disease 20-30% LM; LAD 30%; 50-60% OM2; RCA 40%; EF 65-70% 05/11/2013  . Mitral regurgitation,2+ by cath 05/11/2013  . Atrial fibrillation with RVR, hx of PAF previously 04/27/2013  . PAD (peripheral artery disease), Lt carotid 50-70%: , Hx stent Lt exteral iliac & RSFA stent, known occl. LSFA  04/27/2013  . Hyperlipidemia 04/27/2013  . History of CVA (cerebrovascular accident) 04/27/2013  . NSTEMI (non-ST elevated myocardial infarction) ([place]) 04/27/2013    Willow Ora, PTA, [facility] [address] [place], [place] [address] [phone] 12/30/17, 2:29 PM   Name: [name] MRN: [id number] Date of Birth: 08-10-1951      December 30, 2017 1428  PT G-Codes  Functional Assessment Tool Used (Outpatient Only) pt is able to don/doff prosthesis independently, wearing up to 5-6 hours 2x day every day. needs cues on concepts new with prosthetic care  Functional Limitation Self care  Self Care Current Status [phone]) CK  Self Care Goal Status (V8938) CI    Physical Therapy Progress Note  Dates of Reporting Period: 11/09/2017 to Dec 30, 2017  Objective Reports of Subjective Statement: Patient reports wearing prosthesis daily increasing time as recommended by PT. She reports improved mobility using RW & prosthesis.   Objective Measurements: See above  Goal Update: See above  Plan: Continue established plan of care.   Reason Skilled Services are Required: Patient requires additional skilled instruction to maximize function & minimize fall risk with her transfemoral prosthesis.   Jamey Reas, PT, DPT PT Specializing in [place] 12/21/17 3:21 PM Phone:  [phone]  Fax:  [phone] [facility] [address]  [place], [place] [address]

## 2017-12-22 ENCOUNTER — Encounter: Payer: Self-pay | Admitting: Physical Therapy

## 2017-12-22 ENCOUNTER — Ambulatory Visit: Payer: Medicare Other | Attending: Physical Medicine & Rehabilitation | Admitting: Physical Therapy

## 2017-12-22 DIAGNOSIS — M6281 Muscle weakness (generalized): Secondary | ICD-10-CM | POA: Diagnosis present

## 2017-12-22 DIAGNOSIS — Z89611 Acquired absence of right leg above knee: Secondary | ICD-10-CM | POA: Diagnosis present

## 2017-12-22 DIAGNOSIS — R2689 Other abnormalities of gait and mobility: Secondary | ICD-10-CM

## 2017-12-22 DIAGNOSIS — R2681 Unsteadiness on feet: Secondary | ICD-10-CM | POA: Diagnosis present

## 2017-12-22 DIAGNOSIS — R296 Repeated falls: Secondary | ICD-10-CM | POA: Insufficient documentation

## 2017-12-22 NOTE — Therapy (Signed)
[facility] [address] [place], [place], [address] Phone: [phone]   Fax:  [phone]  Physical Therapy Treatment  Patient Details  Name: [name] MRN: [id number] Date of Birth: 1951/09/17 Referring Provider: Jamse Arn, MD   Encounter Date: 12/22/2017  PT End of Session - 12/22/17 1229    Visit Number  11    Number of Visits  34    Date for PT Re-Evaluation  01/07/18    Authorization Type  Medicare G-code & progress note every 10th visit    PT Start Time  0853    PT Stop Time  0931    PT Time Calculation (min)  38 min    Equipment Utilized During Treatment  Gait belt    Activity Tolerance  Patient tolerated treatment well    Behavior During Therapy  [facility] for tasks assessed/performed       Past Medical History:  Diagnosis Date  . Arthritis    "fingers, some in my knees" (01/28/2016)  . CAD (coronary artery disease), per cath 04/27/13, non obstructive disease, EF 65-70% 05/11/2013  . Carotid disease, bilateral ([place]) 09/2013   Lt carotid 50-69% stentosis, less on rt  . Dysrhythmia   . Fatty liver   . GERD (gastroesophageal reflux disease)   . Glaucoma   . H/O cardiovascular stress test 12/27/2009   negative for ischemia  . Headache    "occasional since eye pressure regulated" (01/28/2016)  . Hyperlipidemia   . Hypertension   . Leukocytosis 07/15/2015  . Migraine    "stopped w/laser holes to relieve pressure in my eyes; had them 3-4 times/wk before taht" (01/28/2016)  . Mitral regurgitation,2+ by cath 05/11/2013  . NSTEMI (non-ST elevated myocardial infarction) ([place]) 04/27/2013  . Pain    BOTH KNEES - PT HAS TORN MENISCUS LEFT KNEE  . Paroxysmal atrial fibrillation (HCC)    hx of a fib on ASA  AND ELIQUIS   . Polycythemia vera ([place]) 08/20/2015   JAK-2 positive 07/19/15  . PVD (peripheral vascular disease) with claudication ([place]) 07/2006   previous L ext iliac stent and rt SFA stent, known occluded Lt SFA   . S/P cardiac cath 04/27/13   NON OBSTRUCTIVE DISEASE, 2+ mr, ef 65-70%  . Statin intolerance   . Stroke ([place]) 2006   SWELLING ALL OVER AND RT SIDE OF FACE DRAWN AND SPEECH SLURRED AND NUMBNESS ON RIGHT SIDE-- ALL RESOLVED    Past Surgical History:  Procedure Laterality Date  . AMPUTATION Right 05/21/2017   Procedure: AMPUTATION ABOVE KNEE;  Surgeon: Rosetta Posner, MD;  Location: [facility];  Service: Vascular;  Laterality: Right;  . CARDIAC CATHETERIZATION  04/27/13   non occlusive disease with mild to mod. calcified lesions in ostial LM and proximal LAD and moderate prox. RC AND DISTAL AV GROOVE LCX, ef  . CATARACT EXTRACTION W/PHACO Right 03/21/2015   Procedure: CATARACT EXTRACTION PHACO AND INTRAOCULAR LENS PLACEMENT RIGHT EYE;  Surgeon: Baruch Goldmann, MD;  [facility];  Service: Ophthalmology;  Laterality: Right;  CDE:5.10  . CATARACT EXTRACTION W/PHACO Left 05/16/2015   Procedure: CATARACT EXTRACTION PHACO AND INTRAOCULAR LENS PLACEMENT (IOC);  Surgeon: Baruch Goldmann, MD;  Location: [facility];  Service: Ophthalmology;  Laterality: Left;  CDE 5.57  . [place]  . COLONOSCOPY N/A 09/11/2016   Procedure: COLONOSCOPY;  Surgeon: Rogene Houston, MD;  [facility];  Service: Endoscopy;  Laterality: N/A;  730-moved to 1:00 Ann notified pt  .  EMBOLECTOMY Right 02/01/2016   Procedure: Thrombectomy of Right Femoral-Popliteal Bypass Graft; Endarterectomy of Right Below Knee Popliteal Artery and Tibial Peroneal Trunk with Bovine Pericardium Patch Angioplasty.;  Surgeon: Angelia Mould, MD;  Location: [facility];  Service: Vascular;  Laterality: Right;  . FEMORAL-POPLITEAL BYPASS GRAFT Right 01/31/2016   Procedure: BYPASS GRAFT RIGHT COMMON  FEMORALTO BELOW KNEE POPLITEAL ARTERY BYPASS GRAFT USING 6MM PROPATEN GORTEX GRAFT;  Surgeon: Mal Misty, MD;  Location: [facility];  Service: Vascular;  Laterality: Right;  . FEMORAL-POPLITEAL BYPASS GRAFT Right 01/09/2017   Procedure: THROMBECTOMY  OF RIGHT FEMORAL-POPLITEAL  ARTERY BYPASS GRAFT; THROMBECTOMY RIGHT  TIBIAL VESSELS;  Surgeon: [name], MD;  Location: [facility];  Service: Vascular;  Laterality: Right;  . FEMORAL-POPLITEAL BYPASS GRAFT Right 05/17/2017   Procedure: RIGHT FEMORAL-TIBIAL PERONEAL TRUNK ARTERY BYPASS GRAFT USING 54mX80cm PROPATEN GRAFT WITH REMOVABLE RING;  Surgeon: ERosetta Posner MD;  [facility]  Service: Vascular;  Laterality: Right;  . FRACTURE SURGERY    . ILIAC ARTERY STENT  07/2006   external iliac and Rt SFA stent 07/2006 AND THE LEFT WAS IN 2006  . KNEE ARTHROSCOPY WITH MEDIAL MENISECTOMY Left 03/27/2014   Procedure: left knee arthorscopy with medial chondraplasty of the medial femoral and patella, medial microfracture technique of medial femoral condyl;  Surgeon: RTobi Bastos MD;  [facility];  Service: Orthopedics;  Laterality: Left;  . LEFT HEART CATHETERIZATION WITH CORONARY ANGIOGRAM Right 04/27/2013   Procedure: LEFT HEART CATHETERIZATION WITH CORONARY ANGIOGRAM;  Surgeon: DLeonie Man MD;  [facility];  Service: Cardiovascular;  Laterality: Right;  . LOWER EXTREMITY ANGIOGRAM Right 01/31/2016   Procedure: INTRAOP RIGHT LOWER EXTREMITY ANGIOGRAM;  Surgeon: JMal Misty MD;  [facility]  Service: Vascular;  Laterality: Right;  . ORIF WRIST FRACTURE Right 1983  . OVARIAN CYST REMOVAL Right   . PATCH ANGIOPLASTY Right 01/31/2016   Procedure: RIGHT COMMON FEMORAL AND PROFUNDA FEMORIS ENDARECTOMY WITH PATCH ANGIOPLASTY;  Surgeon: JMal Misty MD;  [facility]  Service: Vascular;  Laterality: Right;  . PATCH ANGIOPLASTY Right 01/09/2017   Procedure: PATCH ANGIOPLASTY RIGHT POPLITEAL ARTERY BYPASS GRAFT;  Surgeon: CElam Dutch MD;  [facility]  Service: Vascular;  Laterality: Right;  . PERIPHERAL VASCULAR CATHETERIZATION N/A 06/27/2015   Procedure: Lower Extremity Angiography;  Surgeon: JLorretta Harp MD;  [facility];  Service:  Cardiovascular;  Laterality: N/A;  . PERIPHERAL VASCULAR CATHETERIZATION Bilateral 01/29/2016   Procedure: Lower Extremity Angiography;  Surgeon: MWellington Hampshire MD;  [facility];  Service: Cardiovascular;  Laterality: Bilateral;  . PERIPHERAL VASCULAR CATHETERIZATION N/A 01/29/2016   Procedure: Abdominal Aortogram;  Surgeon: MWellington Hampshire MD;  [facility];  Service: Cardiovascular;  Laterality: N/A;  . REFRACTIVE SURGERY Bilateral    "6 in one eye; 7 in the other; to relieve pressure; not glaucoma" (01/28/2016)  . SFA Right 06/27/2015   overlapping Bahn covered stents  . TUBAL LIGATION  1983  . VEIN HARVEST Left 05/17/2017   Procedure: LEFT LEG GREATER SAPHENOUS VEIN HARVEST;  Surgeon: ERosetta Posner MD;  [facility]  Service: Vascular;  Laterality: Left;  .Marland KitchenVEIN REPAIR Right 01/31/2016   Procedure: RIGHT GREATER SAPHENOUS VEIN EXAMNED BUT NOT REMOVED;  Surgeon: JMal Misty MD;  [facility]  Service: Vascular;  Laterality: Right;    There were no vitals filed for this visit.  Subjective Assessment -  12/22/17 0853    Subjective  She has been using crutches in home only and no issues.     Pertinent History  right TFA, arthritis, CAD,A-Fib, PVD, CVA, Glaucoma, migraines, HTN, NSTEMI,  knee pain/left torn meniscus,     Limitations  Lifting;Standing;Walking    Patient Stated Goals  To use prosthesis walking. Previously 2-4 miles.     Currently in Pain?  No/denies                      [facility] Adult PT Treatment/Exercise - 12/22/17 0850      Transfers   Transfers  Sit to Stand;Stand to Sit    Sit to Stand  5: Supervision;With upper extremity assist;With armrests;From chair/3-in-1 to crutches    Sit to Stand Details  Verbal cues for safe use of DME/AE;Verbal cues for sequencing    Sit to Stand Details (indicate cue type and reason)  demo, verbal cues on scooting to edge of chair & prosthetic foot position prior to arising    Stand  to Sit  5: Supervision;With upper extremity assist;With armrests;To chair/3-in-1 from crutches    Stand to Sit Details (indicate cue type and reason)  Verbal cues for sequencing;Verbal cues for safe use of DME/AE      Ambulation/Gait   Ambulation/Gait  Yes    Ambulation/Gait Assistance  5: Supervision;4: Min assist crutches supervision, cane /table support initially minA    Ambulation/Gait Assistance Details  Axillary crutches: verbal cues on prosthetic knee control on grass.   Cane LUE & table support RUE: PT instructed with demo in sequence, posture & wt shift.      Ambulation Distance (Feet)  400 Feet 400' indoor/outdoor 100' grass, 100' cane/table    Assistive device  Prosthesis;Crutches;Straight cane straight cane quad tip LUE & table support RUE    Gait Pattern  Step-through pattern;Decreased step length - left;Decreased stance time - right;Decreased stride length    Ambulation Surface  Indoor;Level;Outdoor;Grass;Paved;Gravel    Ramp  5: Supervision axillary crutches & prosthesis    Ramp Details (indicate cue type and reason)  verbal cues on posture & wt shift    Curb  5: Supervision axillary crutches & prosthesis    Curb Details (indicate cue type and reason)  verbal & demo cues on step thru with 2nd leg for balance /stability      High Level Balance   High Level Balance Activities  Side stepping;Backward walking    High Level Balance Comments  counter support & cane: demo & verbal cues on prosthetic movement & wt shift.      Prosthetics   Prosthetic Care Comments   reviewed donning with long pants    Current prosthetic wear tolerance (days/week)   daily    Current prosthetic wear tolerance (#hours/day)   6 hours 2x day, working  toward 7 hours    Residual limb condition   patient reports intact    Education Provided  Proper wear schedule/adjustment;Proper weight-bearing schedule/adjustment;Residual limb care;Proper Donning    Person(s) Educated  Patient    Education Method   Explanation;Verbal cues    Education Method  Verbalized understanding;Verbal cues required;Needs further instruction             PT Education - 12/22/17 0910    Education provided  Yes    Education Details  reviewed hip flexor stretch & how to modify with low couch.     Person(s) Educated  Patient    Methods  Explanation;Demonstration;Verbal cues  Comprehension  Verbalized understanding       PT Short Term Goals - 12/15/17 1400      PT SHORT TERM GOAL #1   Title  Patient verbalizes proper ply sock adjustment for volume changes. (All STGs Target Date: 01/07/2018)    Time  4    Period  Weeks    Target Date  01/08/18      PT SHORT TERM GOAL #2   Title  Patient tolerates wear of prosthesis >12hrs total /day without skin issues.     Time  4    Period  Weeks    Status  New    Target Date  01/08/18      PT SHORT TERM GOAL #3   Title  Patient performs standing activities with intermittent UE support for minutes, reaches 7" without UE support with supervision.     Time  4    Period  Weeks    Status  New    Target Date  01/08/18      PT SHORT TERM GOAL #4   Title  Patient ambulates 500' outdoors including grass with crutches & prosthesis with supervision.     Time  4    Period  Weeks    Status  New    Target Date  01/08/18      PT SHORT TERM GOAL #5   Title  Patient negotiates stairs (1 rails), ramps & curbs with crutches & prosthesis with supervision.     Time  4    Period  Weeks    Status  New    Target Date  01/08/18        PT Long Term Goals - 11/10/17 1909      PT LONG TERM GOAL #1   Title  Patient verbalizes & demonstrates proper prosthetic care to enable safe use of prosthesis. (All LTGs Target Date: 03/04/2018)    Time  4    Period  Months    Status  On-going    Target Date  03/04/18      PT LONG TERM GOAL #2   Title  Patient tolerates wear of prosthesis >90% of awake hours without skin issues or limb pain to enable function during her day.     Time   4    Period  Months    Status  On-going    Target Date  03/04/18      PT LONG TERM GOAL #3   Title  Patient ambulates household around furniture carrying items with cane or less & prosthesis modified independent.     Time  4    Period  Months    Status  On-going    Target Date  03/04/18      PT LONG TERM GOAL #4   Title  Berg Balance >/= 45/56 to indicate lower fall risk.     Time  4    Period  Months    Status  On-going    Target Date  03/04/18      PT LONG TERM GOAL #5   Title  Patient ambulates >1000' with LRAD & prosthesis outdoors including grass, ramps & curbs modified independent.     Time  4    Period  Months    Status  On-going    Target Date  03/04/18      PT LONG TERM GOAL #6   Title  Patient performs standing ADLs including cooking, laundry, sweeping, etc with prosthesis & LRAD modified indepedent.  Time  4    Period  Months    Status  On-going    Target Date  03/04/18            Plan - 12/22/17 1249    Clinical Impression Statement  Patient appears safe to try prosthetic gait with crutches outdoors with family supervision. Patient initially was fearful of gait with cane & table support but improved with instruction & practice.     PT Treatment/Interventions  ADLs/Self Care Home Management;DME Instruction;Gait training;Stair training;Functional mobility training;Therapeutic activities;Therapeutic exercise;Balance training;Neuromuscular re-education;Patient/family education;Prosthetic Training    PT Next Visit Plan  gait with cane/counter or table, weight shifting activities, crutches on stairs, curbs, outdoor ambulation    Consulted and Agree with Plan of Care  Patient       Patient will benefit from skilled therapeutic intervention in order to improve the following deficits and impairments:  Abnormal gait, Decreased activity tolerance, Decreased balance, Decreased endurance, Decreased knowledge of use of DME, Decreased mobility, Decreased strength,  Prosthetic Dependency, Postural dysfunction  Visit Diagnosis: Other abnormalities of gait and mobility  Unsteadiness on feet  Muscle weakness (generalized)  Repeated falls     Problem List Patient Active Problem List   Diagnosis Date Noted  . Phantom limb pain ([place])   . Hypertensive crisis   . Induration of skin   . Abnormality of gait   . Hypoalbuminemia due to protein-calorie malnutrition ([place])   . Amputee, above knee, right ([place]) 05/25/2017  . Unilateral AKA, right ([place])   . Elevated troponin   . PVD (peripheral vascular disease) ([place])   . Benign essential HTN   . Acute blood loss anemia   . Ischemia of right lower extremity 05/17/2017  . Thrombosis of right femoral artery ([place]) 01/09/2017  . Ischemia of extremity 01/09/2017  . History of colonic polyps 07/30/2016  . Essential hypertension 01/08/2016  . Polycythemia vera ([place]) 08/20/2015  . Leukocytosis 07/15/2015  . S/P arterial stent right SFA overlapping Bahn covered stents 06/27/2015 06/28/2015  . Claudication ([place]) 06/27/2015  . Carotid artery disease ([place]) 08/24/2014  . Osteoarthritis of left knee 03/27/2014  . External hemorrhoids 06/06/2013  . PAF (paroxysmal atrial fibrillation), maintaing SR 05/11/2013  . Chronic anticoagulation, new- eliquis 05/11/2013  . CAD (coronary artery disease), per cath 04/27/13, non obstructive disease 20-30% LM; LAD 30%; 50-60% OM2; RCA 40%; EF 65-70% 05/11/2013  . Mitral regurgitation,2+ by cath 05/11/2013  . Atrial fibrillation with RVR, hx of PAF previously 04/27/2013  . PAD (peripheral artery disease), Lt carotid 50-70%: , Hx stent Lt exteral iliac & RSFA stent, known occl. LSFA  04/27/2013  . Hyperlipidemia 04/27/2013  . History of CVA (cerebrovascular accident) 04/27/2013  . NSTEMI (non-ST elevated myocardial infarction) ([place]) 04/27/2013    Laporshia Hogen PT, DPT 12/22/2017, 12:51 PM  [facility] [address] [place], [place], [address] Phone: [phone]   Fax:  [phone]  Name: [name] MRN: [id number] Date of Birth: [birth year]

## 2017-12-24 ENCOUNTER — Ambulatory Visit: Payer: Medicare Other | Admitting: Physical Therapy

## 2017-12-27 ENCOUNTER — Encounter: Payer: Medicare Other | Admitting: Physical Therapy

## 2017-12-28 ENCOUNTER — Other Ambulatory Visit: Payer: Medicare Other

## 2017-12-29 ENCOUNTER — Encounter: Payer: Medicare Other | Admitting: Physical Therapy

## 2017-12-31 ENCOUNTER — Other Ambulatory Visit (INDEPENDENT_AMBULATORY_CARE_PROVIDER_SITE_OTHER): Payer: Medicare Other

## 2017-12-31 DIAGNOSIS — D45 Polycythemia vera: Secondary | ICD-10-CM

## 2018-01-01 LAB — CBC WITH DIFFERENTIAL/PLATELET
BASOS ABS: 0.6 10*3/uL — AB (ref 0.0–0.2)
BASOS: 2 %
EOS (ABSOLUTE): 1.1 10*3/uL — AB (ref 0.0–0.4)
Eos: 4 %
Hematocrit: 46.8 % — ABNORMAL HIGH (ref 34.0–46.6)
Hemoglobin: 14.3 g/dL (ref 11.1–15.9)
LYMPHS: 10 %
Lymphocytes Absolute: 2.8 10*3/uL (ref 0.7–3.1)
MCH: 21.4 pg — AB (ref 26.6–33.0)
MCHC: 30.6 g/dL — AB (ref 31.5–35.7)
MCV: 70 fL — ABNORMAL LOW (ref 79–97)
MONOCYTES: 1 %
Monocytes Absolute: 0.3 10*3/uL (ref 0.1–0.9)
NRBC: 1 % — ABNORMAL HIGH (ref 0–0)
Neutrophils Absolute: 21.2 10*3/uL — ABNORMAL HIGH (ref 1.4–7.0)
Neutrophils: 75 %
PLATELETS: 407 10*3/uL — AB (ref 150–379)
RBC: 6.67 x10E6/uL — AB (ref 3.77–5.28)
RDW: 19.4 % — AB (ref 12.3–15.4)
WBC: 28.3 10*3/uL (ref 3.4–10.8)

## 2018-01-01 LAB — IMMATURE CELLS
Blasts/blast like cells: 2 % — ABNORMAL HIGH (ref 0–0)
METAMYELOCYTES: 3 % — AB (ref 0–0)
MYELOCYTES: 3 % — AB (ref 0–0)

## 2018-01-03 ENCOUNTER — Encounter: Payer: Self-pay | Admitting: Physical Therapy

## 2018-01-03 ENCOUNTER — Ambulatory Visit: Payer: Medicare Other | Admitting: Physical Therapy

## 2018-01-03 DIAGNOSIS — M6281 Muscle weakness (generalized): Secondary | ICD-10-CM

## 2018-01-03 DIAGNOSIS — R2689 Other abnormalities of gait and mobility: Secondary | ICD-10-CM | POA: Diagnosis not present

## 2018-01-03 DIAGNOSIS — R2681 Unsteadiness on feet: Secondary | ICD-10-CM

## 2018-01-03 NOTE — Therapy (Signed)
[facility] [address] [place], [place], [address] Phone: [phone]   Fax:  [phone]  Physical Therapy Treatment  Patient Details  Name: [name] MRN: [id number] Date of Birth: May 31, 1951 Referring Provider: Jamse Arn, MD   Encounter Date: 01/03/2018  PT End of Session - 01/03/18 1043    Visit Number  12    Number of Visits  34    Date for PT Re-Evaluation  01/07/18    Authorization Type  Medicare G-code & progress note every 10th visit    PT Start Time  0930    PT Stop Time  1015    PT Time Calculation (min)  45 min    Equipment Utilized During Treatment  Gait belt    Activity Tolerance  Patient tolerated treatment well    Behavior During Therapy  [facility] for tasks assessed/performed       Past Medical History:  Diagnosis Date  . Arthritis    "fingers, some in my knees" (01/28/2016)  . CAD (coronary artery disease), per cath 04/27/13, non obstructive disease, EF 65-70% 05/11/2013  . Carotid disease, bilateral ([place]) 09/2013   Lt carotid 50-69% stentosis, less on rt  . Dysrhythmia   . Fatty liver   . GERD (gastroesophageal reflux disease)   . Glaucoma   . H/O cardiovascular stress test 12/27/2009   negative for ischemia  . Headache    "occasional since eye pressure regulated" (01/28/2016)  . Hyperlipidemia   . Hypertension   . Leukocytosis 07/15/2015  . Migraine    "stopped w/laser holes to relieve pressure in my eyes; had them 3-4 times/wk before taht" (01/28/2016)  . Mitral regurgitation,2+ by cath 05/11/2013  . NSTEMI (non-ST elevated myocardial infarction) ([place]) 04/27/2013  . Pain    BOTH KNEES - PT HAS TORN MENISCUS LEFT KNEE  . Paroxysmal atrial fibrillation (HCC)    hx of a fib on ASA  AND ELIQUIS   . Polycythemia vera ([place]) 08/20/2015   JAK-2 positive 07/19/15  . PVD (peripheral vascular disease) with claudication ([place]) 07/2006   previous L ext iliac stent and rt SFA stent, known occluded Lt SFA   . S/P cardiac cath 04/27/13   NON OBSTRUCTIVE DISEASE, 2+ mr, ef 65-70%  . Statin intolerance   . Stroke ([place]) 2006   SWELLING ALL OVER AND RT SIDE OF FACE DRAWN AND SPEECH SLURRED AND NUMBNESS ON RIGHT SIDE-- ALL RESOLVED    Past Surgical History:  Procedure Laterality Date  . AMPUTATION Right 05/21/2017   Procedure: AMPUTATION ABOVE KNEE;  Surgeon: Rosetta Posner, MD;  Location: [facility];  Service: Vascular;  Laterality: Right;  . CARDIAC CATHETERIZATION  04/27/13   non occlusive disease with mild to mod. calcified lesions in ostial LM and proximal LAD and moderate prox. RC AND DISTAL AV GROOVE LCX, ef  . CATARACT EXTRACTION W/PHACO Right 03/21/2015   Procedure: CATARACT EXTRACTION PHACO AND INTRAOCULAR LENS PLACEMENT RIGHT EYE;  Surgeon: Baruch Goldmann, MD;  [facility];  Service: Ophthalmology;  Laterality: Right;  CDE:5.10  . CATARACT EXTRACTION W/PHACO Left 05/16/2015   Procedure: CATARACT EXTRACTION PHACO AND INTRAOCULAR LENS PLACEMENT (IOC);  Surgeon: Baruch Goldmann, MD;  [facility]: [facility];  Service: Ophthalmology;  Laterality: Left;  CDE 5.57  . [place]  . COLONOSCOPY N/A 09/11/2016   Procedure: COLONOSCOPY;  Surgeon: Rogene Houston, MD;  [facility];  Service: Endoscopy;  Laterality: N/A;  730-moved to 1:00 Ann notified pt  .  EMBOLECTOMY Right 02/01/2016   Procedure: Thrombectomy of Right Femoral-Popliteal Bypass Graft; Endarterectomy of Right Below Knee Popliteal Artery and Tibial Peroneal Trunk with Bovine Pericardium Patch Angioplasty.;  Surgeon: Angelia Mould, MD;  Location: [facility];  Service: Vascular;  Laterality: Right;  . FEMORAL-POPLITEAL BYPASS GRAFT Right 01/31/2016   Procedure: BYPASS GRAFT RIGHT COMMON  FEMORALTO BELOW KNEE POPLITEAL ARTERY BYPASS GRAFT USING 6MM PROPATEN GORTEX GRAFT;  Surgeon: Mal Misty, MD;  Location: [facility];  Service: Vascular;  Laterality: Right;  . FEMORAL-POPLITEAL BYPASS GRAFT Right 01/09/2017   Procedure: THROMBECTOMY  OF RIGHT FEMORAL-POPLITEAL  ARTERY BYPASS GRAFT; THROMBECTOMY RIGHT  TIBIAL VESSELS;  Surgeon: [name], MD;  Location: [facility];  Service: Vascular;  Laterality: Right;  . FEMORAL-POPLITEAL BYPASS GRAFT Right 05/17/2017   Procedure: RIGHT FEMORAL-TIBIAL PERONEAL TRUNK ARTERY BYPASS GRAFT USING 54mX80cm PROPATEN GRAFT WITH REMOVABLE RING;  Surgeon: ERosetta Posner MD;  [facility]  Service: Vascular;  Laterality: Right;  . FRACTURE SURGERY    . ILIAC ARTERY STENT  07/2006   external iliac and Rt SFA stent 07/2006 AND THE LEFT WAS IN 2006  . KNEE ARTHROSCOPY WITH MEDIAL MENISECTOMY Left 03/27/2014   Procedure: left knee arthorscopy with medial chondraplasty of the medial femoral and patella, medial microfracture technique of medial femoral condyl;  Surgeon: RTobi Bastos MD;  [facility];  Service: Orthopedics;  Laterality: Left;  . LEFT HEART CATHETERIZATION WITH CORONARY ANGIOGRAM Right 04/27/2013   Procedure: LEFT HEART CATHETERIZATION WITH CORONARY ANGIOGRAM;  Surgeon: DLeonie Man MD;  [facility];  Service: Cardiovascular;  Laterality: Right;  . LOWER EXTREMITY ANGIOGRAM Right 01/31/2016   Procedure: INTRAOP RIGHT LOWER EXTREMITY ANGIOGRAM;  Surgeon: JMal Misty MD;  [facility]  Service: Vascular;  Laterality: Right;  . ORIF WRIST FRACTURE Right 1983  . OVARIAN CYST REMOVAL Right   . PATCH ANGIOPLASTY Right 01/31/2016   Procedure: RIGHT COMMON FEMORAL AND PROFUNDA FEMORIS ENDARECTOMY WITH PATCH ANGIOPLASTY;  Surgeon: JMal Misty MD;  [facility]  Service: Vascular;  Laterality: Right;  . PATCH ANGIOPLASTY Right 01/09/2017   Procedure: PATCH ANGIOPLASTY RIGHT POPLITEAL ARTERY BYPASS GRAFT;  Surgeon: CElam Dutch MD;  [facility]  Service: Vascular;  Laterality: Right;  . PERIPHERAL VASCULAR CATHETERIZATION N/A 06/27/2015   Procedure: Lower Extremity Angiography;  Surgeon: JLorretta Harp MD;  [facility];  Service:  Cardiovascular;  Laterality: N/A;  . PERIPHERAL VASCULAR CATHETERIZATION Bilateral 01/29/2016   Procedure: Lower Extremity Angiography;  Surgeon: MWellington Hampshire MD;  [facility];  Service: Cardiovascular;  Laterality: Bilateral;  . PERIPHERAL VASCULAR CATHETERIZATION N/A 01/29/2016   Procedure: Abdominal Aortogram;  Surgeon: MWellington Hampshire MD;  [facility];  Service: Cardiovascular;  Laterality: N/A;  . REFRACTIVE SURGERY Bilateral    "6 in one eye; 7 in the other; to relieve pressure; not glaucoma" (01/28/2016)  . SFA Right 06/27/2015   overlapping Bahn covered stents  . TUBAL LIGATION  1983  . VEIN HARVEST Left 05/17/2017   Procedure: LEFT LEG GREATER SAPHENOUS VEIN HARVEST;  Surgeon: ERosetta Posner MD;  [facility]  Service: Vascular;  Laterality: Left;  .Marland KitchenVEIN REPAIR Right 01/31/2016   Procedure: RIGHT GREATER SAPHENOUS VEIN EXAMNED BUT NOT REMOVED;  Surgeon: JMal Misty MD;  [facility]  Service: Vascular;  Laterality: Right;    There were no vitals filed for this visit.  Subjective Assessment -  01/03/18 0930    Subjective  The apt complex paved walkway from ramp to car area which helps. She has used crutches outside a few time with family.     Pertinent History  right TFA, arthritis, CAD,A-Fib, PVD, CVA, Glaucoma, migraines, HTN, NSTEMI,  knee pain/left torn meniscus,     Limitations  Lifting;Standing;Walking    Patient Stated Goals  To use prosthesis walking. Previously 2-4 miles.     Currently in Pain?  No/denies                      [facility] Adult PT Treatment/Exercise - 01/03/18 0930      Transfers   Transfers  Sit to Stand;Stand to Sit    Sit to Stand  5: Supervision;With upper extremity assist;With armrests;From chair/3-in-1 to cane    Sit to Stand Details  Verbal cues for safe use of DME/AE;Verbal cues for sequencing    Stand to Sit  5: Supervision;With upper extremity assist;With armrests;To chair/3-in-1 from cane     Stand to Sit Details (indicate cue type and reason)  Verbal cues for sequencing;Verbal cues for safe use of DME/AE      Ambulation/Gait   Ambulation/Gait  Yes    Ambulation/Gait Assistance  5: Supervision;4: Min assist crutches supervision, cane /hand held support initially minA    Ambulation/Gait Assistance Details  manual & verbal cues on weight shift, balance reaction and sequence with cane    Ambulation Distance (Feet)  50 Feet 50' X 2 cane    Assistive device  Prosthesis;Crutches;Straight cane;1 person hand held assist straight cane quad tip LUE & hand hold support RUE    Gait Pattern  Step-through pattern;Decreased step length - left;Decreased stance time - right;Decreased stride length    Ambulation Surface  Indoor;Level    Stairs  Yes    Stairs Assistance  5: Supervision    Stairs Assistance Details (indicate cue type and reason)  verbal cues on 1 crutch & rail with higher step to simulate bus/van     Stair Management Technique  One rail Right;Step to pattern;Forwards;With crutches    Number of Stairs  4 4 & 2 steps    Height of Stairs  6 6" and 8"    Ramp  5: Supervision axillary crutches & prosthesis    Curb  5: Supervision axillary crutches & prosthesis      High Level Balance   High Level Balance Activities  --    High Level Balance Comments  --      Prosthetics   Prosthetic Care Comments   PT set up appt with prosthetist to address rotation issue with possible trouser pad.     Current prosthetic wear tolerance (days/week)   daily    Current prosthetic wear tolerance (#hours/day)   7 hours 2x day    Residual limb condition   patient reports intact    Education Provided  Proper wear schedule/adjustment;Proper weight-bearing schedule/adjustment;Residual limb care;Proper Donning    Person(s) Educated  Patient    Education Method  Explanation;Demonstration;Tactile cues;Verbal cues    Education Method  Verbalized understanding;Returned demonstration;Tactile cues  required;Verbal cues required               PT Short Term Goals - 01/03/18 1043      PT SHORT TERM GOAL #1   Title  Patient verbalizes proper ply sock adjustment for volume changes. (All STGs Target Date: 01/07/2018)    Baseline  MET 01/03/2018    Time  4  Period  Weeks    Status  Achieved      PT SHORT TERM GOAL #2   Title  Patient tolerates wear of prosthesis >12hrs total /day without skin issues.     Baseline  MET 01/03/2018    Time  4    Period  Weeks    Status  Achieved      PT SHORT TERM GOAL #3   Title  Patient performs standing activities with intermittent UE support for minutes, reaches 7" without UE support with supervision.     Baseline  MET 01/03/2018    Time  4    Period  Weeks    Status  Achieved      PT SHORT TERM GOAL #4   Title  Patient ambulates 500' outdoors including grass with crutches & prosthesis with supervision.     Time  4    Period  Weeks    Status  On-going    Target Date  01/08/18      PT SHORT TERM GOAL #5   Title  Patient negotiates stairs (1 rails), ramps & curbs with crutches & prosthesis with supervision.     Baseline  MET 01/03/2018    Time  4    Period  Weeks    Status  Achieved        PT Long Term Goals - 11/10/17 1909      PT LONG TERM GOAL #1   Title  Patient verbalizes & demonstrates proper prosthetic care to enable safe use of prosthesis. (All LTGs Target Date: 03/04/2018)    Time  4    Period  Months    Status  On-going    Target Date  03/04/18      PT LONG TERM GOAL #2   Title  Patient tolerates wear of prosthesis >90% of awake hours without skin issues or limb pain to enable function during her day.     Time  4    Period  Months    Status  On-going    Target Date  03/04/18      PT LONG TERM GOAL #3   Title  Patient ambulates household around furniture carrying items with cane or less & prosthesis modified independent.     Time  4    Period  Months    Status  On-going    Target Date  03/04/18      PT  LONG TERM GOAL #4   Title  Berg Balance >/= 45/56 to indicate lower fall risk.     Time  4    Period  Months    Status  On-going    Target Date  03/04/18      PT LONG TERM GOAL #5   Title  Patient ambulates >1000' with LRAD & prosthesis outdoors including grass, ramps & curbs modified independent.     Time  4    Period  Months    Status  On-going    Target Date  03/04/18      PT LONG TERM GOAL #6   Title  Patient performs standing ADLs including cooking, laundry, sweeping, etc with prosthesis & LRAD modified indepedent.     Time  4    Period  Months    Status  On-going    Target Date  03/04/18            Plan - 01/03/18 1044    Clinical Impression Statement  Patient met 4 STGs and anticipate meeting remaining  STG next visit. Patient is progressing with prosthetic activity level with skilled intervention. Patient needs to see prosthetist regarding rotation of prosthesis. Appt set up tomorrow 1/15.     PT Treatment/Interventions  ADLs/Self Care Home Management;DME Instruction;Gait training;Stair training;Functional mobility training;Therapeutic activities;Therapeutic exercise;Balance training;Neuromuscular re-education;Patient/family education;Prosthetic Training    PT Next Visit Plan  check remaining STG and recertify, gait with cane/counter or table, weight shifting activities, crutches on stairs, curbs, outdoor ambulation    Consulted and Agree with Plan of Care  Patient       Patient will benefit from skilled therapeutic intervention in order to improve the following deficits and impairments:  Abnormal gait, Decreased activity tolerance, Decreased balance, Decreased endurance, Decreased knowledge of use of DME, Decreased mobility, Decreased strength, Prosthetic Dependency, Postural dysfunction  Visit Diagnosis: Other abnormalities of gait and mobility  Unsteadiness on feet  Muscle weakness (generalized)     Problem List Patient Active Problem List   Diagnosis Date  Noted  . Phantom limb pain ([place])   . Hypertensive crisis   . Induration of skin   . Abnormality of gait   . Hypoalbuminemia due to protein-calorie malnutrition ([place])   . Amputee, above knee, right ([place]) 05/25/2017  . Unilateral AKA, right ([place])   . Elevated troponin   . PVD (peripheral vascular disease) ([place])   . Benign essential HTN   . Acute blood loss anemia   . Ischemia of right lower extremity 05/17/2017  . Thrombosis of right femoral artery ([place]) 01/09/2017  . Ischemia of extremity 01/09/2017  . History of colonic polyps 07/30/2016  . Essential hypertension 01/08/2016  . Polycythemia vera ([place]) 08/20/2015  . Leukocytosis 07/15/2015  . S/P arterial stent right SFA overlapping Bahn covered stents 06/27/2015 06/28/2015  . Claudication ([place]) 06/27/2015  . Carotid artery disease ([place]) 08/24/2014  . Osteoarthritis of left knee 03/27/2014  . External hemorrhoids 06/06/2013  . PAF (paroxysmal atrial fibrillation), maintaing SR 05/11/2013  . Chronic anticoagulation, new- eliquis 05/11/2013  . CAD (coronary artery disease), per cath 04/27/13, non obstructive disease 20-30% LM; LAD 30%; 50-60% OM2; RCA 40%; EF 65-70% 05/11/2013  . Mitral regurgitation,2+ by cath 05/11/2013  . Atrial fibrillation with RVR, hx of PAF previously 04/27/2013  . PAD (peripheral artery disease), Lt carotid 50-70%: , Hx stent Lt exteral iliac & RSFA stent, known occl. LSFA  04/27/2013  . Hyperlipidemia 04/27/2013  . History of CVA (cerebrovascular accident) 04/27/2013  . NSTEMI (non-ST elevated myocardial infarction) ([place]) 04/27/2013    Britley Gashi  PT, DPT 01/03/2018, 10:57 AM  [facility] [address] [place], [place], [address] Phone: [phone]   Fax:  [phone]  Name: DASHAWNA DELBRIDGE MRN: [id number] Date of Birth: 1951-05-28

## 2018-01-05 ENCOUNTER — Encounter: Payer: Self-pay | Admitting: Physical Therapy

## 2018-01-05 ENCOUNTER — Ambulatory Visit: Payer: Medicare Other | Admitting: Physical Therapy

## 2018-01-05 DIAGNOSIS — R2689 Other abnormalities of gait and mobility: Secondary | ICD-10-CM | POA: Diagnosis not present

## 2018-01-05 DIAGNOSIS — Z89611 Acquired absence of right leg above knee: Secondary | ICD-10-CM

## 2018-01-05 DIAGNOSIS — R296 Repeated falls: Secondary | ICD-10-CM

## 2018-01-05 DIAGNOSIS — R2681 Unsteadiness on feet: Secondary | ICD-10-CM

## 2018-01-05 DIAGNOSIS — M6281 Muscle weakness (generalized): Secondary | ICD-10-CM

## 2018-01-05 NOTE — Therapy (Signed)
[facility] [address] [place], [place], [address] Phone: [phone]   Fax:  [phone]  Physical Therapy Treatment  Patient Details  Name: [name] MRN: [id number] Date of Birth: [birth year] Referring Provider: Jamse Arn, MD   Encounter Date: 01/05/2018  PT End of Session - 01/05/18 1115    Visit Number  13    Number of Visits  29    Date for PT Re-Evaluation  03/04/18    Authorization Type  Medicare    PT Start Time  0845    PT Stop Time  0930    PT Time Calculation (min)  45 min    Equipment Utilized During Treatment  Gait belt    Activity Tolerance  Patient tolerated treatment well    Behavior During Therapy  [facility] for tasks assessed/performed       Past Medical History:  Diagnosis Date  . Arthritis    "fingers, some in my knees" (01/28/2016)  . CAD (coronary artery disease), per cath 04/27/13, non obstructive disease, EF 65-70% 05/11/2013  . Carotid disease, bilateral ([place]) 09/2013   Lt carotid 50-69% stentosis, less on rt  . Dysrhythmia   . Fatty liver   . GERD (gastroesophageal reflux disease)   . Glaucoma   . H/O cardiovascular stress test 12/27/2009   negative for ischemia  . Headache    "occasional since eye pressure regulated" (01/28/2016)  . Hyperlipidemia   . Hypertension   . Leukocytosis 07/15/2015  . Migraine    "stopped w/laser holes to relieve pressure in my eyes; had them 3-4 times/wk before taht" (01/28/2016)  . Mitral regurgitation,2+ by cath 05/11/2013  . NSTEMI (non-ST elevated myocardial infarction) ([place]) 04/27/2013  . Pain    BOTH KNEES - PT HAS TORN MENISCUS LEFT KNEE  . Paroxysmal atrial fibrillation (HCC)    hx of a fib on ASA  AND ELIQUIS   . Polycythemia vera ([place]) 08/20/2015   JAK-2 positive 07/19/15  . PVD (peripheral vascular disease) with claudication ([place]) 07/2006   previous L ext iliac stent and rt SFA stent, known occluded Lt SFA  . S/P cardiac cath 04/27/13   NON  OBSTRUCTIVE DISEASE, 2+ mr, ef 65-70%  . Statin intolerance   . Stroke ([place]) 2006   SWELLING ALL OVER AND RT SIDE OF FACE DRAWN AND SPEECH SLURRED AND NUMBNESS ON RIGHT SIDE-- ALL RESOLVED    Past Surgical History:  Procedure Laterality Date  . AMPUTATION Right 05/21/2017   Procedure: AMPUTATION ABOVE KNEE;  Surgeon: Rosetta Posner, MD;  Location: [facility];  Service: Vascular;  Laterality: Right;  . CARDIAC CATHETERIZATION  04/27/13   non occlusive disease with mild to mod. calcified lesions in ostial LM and proximal LAD and moderate prox. RC AND DISTAL AV GROOVE LCX, ef  . CATARACT EXTRACTION W/PHACO Right 03/21/2015   Procedure: CATARACT EXTRACTION PHACO AND INTRAOCULAR LENS PLACEMENT RIGHT EYE;  Surgeon: Baruch Goldmann, MD;  Location[facility];  Service: Ophthalmology;  Laterality: Right;  CDE:5.10  . CATARACT EXTRACTION W/PHACO Left 05/16/2015   Procedure: CATARACT EXTRACTION PHACO AND INTRAOCULAR LENS PLACEMENT (IOC);  Surgeon: Baruch Goldmann, MD;  Location: [facility];  Service: Ophthalmology;  Laterality: Left;  CDE 5.57  . [name]  . COLONOSCOPY N/A 09/11/2016   Procedure: COLONOSCOPY;  Surgeon: Rogene Houston, MD;  [facility];  Service: Endoscopy;  Laterality: N/A;  730-moved to 1:00 Ann notified pt  . EMBOLECTOMY Right 02/01/2016  Procedure: Thrombectomy of Right Femoral-Popliteal Bypass Graft; Endarterectomy of Right Below Knee Popliteal Artery and Tibial Peroneal Trunk with Bovine Pericardium Patch Angioplasty.;  Surgeon: Angelia Mould, MD;  Location: [facility];  Service: Vascular;  Laterality: Right;  . FEMORAL-POPLITEAL BYPASS GRAFT Right 01/31/2016   Procedure: BYPASS GRAFT RIGHT COMMON  FEMORALTO BELOW KNEE POPLITEAL ARTERY BYPASS GRAFT USING 6MM PROPATEN GORTEX GRAFT;  Surgeon: Mal Misty, MD;  Location: [facility];  Service: Vascular;  Laterality: Right;  . FEMORAL-POPLITEAL BYPASS GRAFT Right 01/09/2017   Procedure: THROMBECTOMY OF RIGHT FEMORAL-POPLITEAL  ARTERY  BYPASS GRAFT; THROMBECTOMY RIGHT  TIBIAL VESSELS;  Surgeon: [name], MD;  Location: [facility];  Service: Vascular;  Laterality: Right;  . FEMORAL-POPLITEAL BYPASS GRAFT Right 05/17/2017   Procedure: RIGHT FEMORAL-TIBIAL PERONEAL TRUNK ARTERY BYPASS GRAFT USING 86mX80cm PROPATEN GRAFT WITH REMOVABLE RING;  Surgeon: ERosetta Posner MD;  [place] [facility]  Service: Vascular;  Laterality: Right;  . FRACTURE SURGERY    . ILIAC ARTERY STENT  07/2006   external iliac and Rt SFA stent 07/2006 AND THE LEFT WAS IN 2006  . KNEE ARTHROSCOPY WITH MEDIAL MENISECTOMY Left 03/27/2014   Procedure: left knee arthorscopy with medial chondraplasty of the medial femoral and patella, medial microfracture technique of medial femoral condyl;  Surgeon: RTobi Bastos MD;  [facility];  Service: Orthopedics;  Laterality: Left;  . LEFT HEART CATHETERIZATION WITH CORONARY ANGIOGRAM Right 04/27/2013   Procedure: LEFT HEART CATHETERIZATION WITH CORONARY ANGIOGRAM;  Surgeon: DLeonie Man MD;  [facility];  Service: Cardiovascular;  Laterality: Right;  . LOWER EXTREMITY ANGIOGRAM Right 01/31/2016   Procedure: INTRAOP RIGHT LOWER EXTREMITY ANGIOGRAM;  Surgeon: JMal Misty MD;  [place]  Service: Vascular;  Laterality: Right;  . ORIF WRIST FRACTURE Right 1983  . OVARIAN CYST REMOVAL Right   . PATCH ANGIOPLASTY Right 01/31/2016   Procedure: RIGHT COMMON FEMORAL AND PROFUNDA FEMORIS ENDARECTOMY WITH PATCH ANGIOPLASTY;  Surgeon: JMal Misty MD;  [place] [facility]  Service: Vascular;  Laterality: Right;  . PATCH ANGIOPLASTY Right 01/09/2017   Procedure: PATCH ANGIOPLASTY RIGHT POPLITEAL ARTERY BYPASS GRAFT;  Surgeon: CElam Dutch MD;  [place] [facility]  Service: Vascular;  Laterality: Right;  . PERIPHERAL VASCULAR CATHETERIZATION N/A 06/27/2015   Procedure: Lower Extremity Angiography;  Surgeon: JLorretta Harp MD;  [facility];  Service: Cardiovascular;  Laterality: N/A;  .  PERIPHERAL VASCULAR CATHETERIZATION Bilateral 01/29/2016   Procedure: Lower Extremity Angiography;  Surgeon: MWellington Hampshire MD;  [facility];  Service: Cardiovascular;  Laterality: Bilateral;  . PERIPHERAL VASCULAR CATHETERIZATION N/A 01/29/2016   Procedure: Abdominal Aortogram;  Surgeon: MWellington Hampshire MD;  [facility];  Service: Cardiovascular;  Laterality: N/A;  . REFRACTIVE SURGERY Bilateral    "6 in one eye; 7 in the other; to relieve pressure; not glaucoma" (01/28/2016)  . SFA Right 06/27/2015   overlapping Bahn covered stents  . TUBAL LIGATION  1983  . VEIN HARVEST Left 05/17/2017   Procedure: LEFT LEG GREATER SAPHENOUS VEIN HARVEST;  Surgeon: ERosetta Posner MD;  [place] [facility]  Service: Vascular;  Laterality: Left;  .Marland KitchenVEIN REPAIR Right 01/31/2016   Procedure: RIGHT GREATER SAPHENOUS VEIN EXAMNED BUT NOT REMOVED;  Surgeon: JMal Misty MD;  [place] [facility]  Service: Vascular;  Laterality: Right;    There were no vitals filed for this visit.  Subjective Assessment - 01/05/18 0850  Subjective  She saw prosthetist yesterday who changed rotation, put lateral pad in socket, added trouser pad to posterior external and laminated socket.     Pertinent History  right TFA, arthritis, CAD,A-Fib, PVD, CVA, Glaucoma, migraines, HTN, NSTEMI,  knee pain/left torn meniscus,     Limitations  Lifting;Standing;Walking    Patient Stated Goals  To use prosthesis walking. Previously 2-4 miles.     Currently in Pain?  No/denies                      [facility] Adult PT Treatment/Exercise - 01/05/18 1116      Transfers   Transfers  Sit to Stand;Stand to Sit    Sit to Stand  4: Min guard;6: Modified independent (Device/Increase time);With upper extremity assist;With armrests;From chair/3-in-1 Min Guard to cane & Modified indep to crutches    Sit to Stand Details  Verbal cues for safe use of DME/AE;Verbal cues for sequencing    Stand to Sit  5:  Supervision;6: Modified independent (Device/Increase time);With upper extremity assist;With armrests;To chair/3-in-1 supervision from cane & modified indep. crutches    Stand to Sit Details (indicate cue type and reason)  Verbal cues for sequencing;Verbal cues for safe use of DME/AE      Ambulation/Gait   Ambulation/Gait  Yes    Ambulation/Gait Assistance  5: Supervision;4: Min assist crutches supervision, cane /hand held support initially minA    Ambulation/Gait Assistance Details  crutches outdoors including 100' on grass.  Cane in kitchen for ADLs    Ambulation Distance (Feet)  500 Feet 500' with crutches & 50' X 2 with cane    Assistive device  Prosthesis;Crutches;Straight cane;1 person hand held assist straight cane quad tip LUE & hand hold support RUE    Gait Pattern  Step-through pattern;Decreased step length - left;Decreased stance time - right;Decreased stride length    Ambulation Surface  Indoor;Level;Outdoor;Paved;Gravel;Grass    Ramp  5: Supervision axillary crutches & prosthesis    Curb  5: Supervision axillary crutches & prosthesis      Self-Care   Self-Care  ADL's    ADL's  PT demo, instructed in ADLs in kitchen using prosthesis & cane including moving pots with water. Pt verbalized & return demo understanding.       Prosthetics   Prosthetic Care Comments   Prosthetist brought prosthesis to PT office after lamination just prior to her PT appointment. Pt donned with minimal verbal cues on rotation orientation. PT instructed ply socks may be reduced with internal lateral pad addition. PT also instructed in fall risk when prosthesis is off.     Current prosthetic wear tolerance (days/week)   daily    Current prosthetic wear tolerance (#hours/day)   7-8 hours 2x day    Residual limb condition   intact    Education Provided  Proper wear schedule/adjustment;Proper Donning;Correct ply sock adjustment;Other (comment) see prosthetic care instructions    Person(s) Educated  Patient     Education Method  Explanation;Verbal cues    Education Method  Verbalized understanding;Verbal cues required    Donning Prosthesis  Supervision    Doffing Prosthesis  Modified independent (device/increased time)             PT Education - 01/05/18 0900    Education provided  Yes    Education Details  Use of board under w/c cushion to decrease sling, hammock effect that may be rotating prosthesis in sitting    Person(s) Educated  Patient    Methods  Explanation;Verbal cues    Comprehension  Verbalized understanding      PT Short Term Goals - 01/05/18 1115      PT SHORT TERM GOAL #1   Title  Patient verbalizes proper ply sock adjustment for volume changes. (All STGs Target Date: 01/07/2018)    Baseline  MET 01/03/2018    Time  4    Period  Weeks    Status  Achieved      PT SHORT TERM GOAL #2   Title  Patient tolerates wear of prosthesis >12hrs total /day without skin issues.     Baseline  MET 01/03/2018    Time  4    Period  Weeks    Status  Achieved      PT SHORT TERM GOAL #3   Title  Patient performs standing activities with intermittent UE support for minutes, reaches 7" without UE support with supervision.     Baseline  MET 01/03/2018    Time  4    Period  Weeks    Status  Achieved      PT SHORT TERM GOAL #4   Title  Patient ambulates 500' outdoors including grass with crutches & prosthesis with supervision.     Baseline  MET 01/05/2018    Time  4    Period  Weeks    Status  Achieved      PT SHORT TERM GOAL #5   Title  Patient negotiates stairs (1 rails), ramps & curbs with crutches & prosthesis with supervision.     Baseline  MET 01/03/2018    Time  4    Period  Weeks    Status  Achieved         PT Short Term Goals - 01/05/18 1132      PT SHORT TERM GOAL #1   Title  Patient verbalizes & demonstrates proper donning with correct rotation.  (All STGs Target Date: 02/03/2018)    Time  4    Period  Weeks    Status  New    Target Date  02/03/18      PT  SHORT TERM GOAL #2   Title  Patient able to reach 5" & to floor with cane support with supervision.     Time  4    Period  Weeks    Status  New    Target Date  02/03/18      PT SHORT TERM GOAL #3   Title  Patient demonstrates standing ADLs like cooking in kitchen or laundry with prosthesis with supervision.     Time  4    Period  Weeks    Status  New    Target Date  02/03/18      PT SHORT TERM GOAL #4   Title  Patient ambulates 200' with cane & prosthesis with supervision.     Time  4    Period  Weeks    Status  New    Target Date  02/03/18      PT SHORT TERM GOAL #5   Title  Patient negotiates stairs (1 rails), ramps & curbs with cane & prosthesis with minimal assist    Time  4    Period  Weeks    Status  New    Target Date  02/03/18        PT Long Term Goals - 01/05/18 1135      PT LONG TERM GOAL #1   Title  Patient verbalizes & demonstrates  proper prosthetic care to enable safe use of prosthesis. (All LTGs Target Date: 03/04/2018)    Time  4    Period  Months    Status  On-going    Target Date  03/04/18      PT LONG TERM GOAL #2   Title  Patient tolerates wear of prosthesis >90% of awake hours without skin issues or limb pain to enable function during her day.     Time  4    Period  Months    Status  On-going    Target Date  03/04/18      PT LONG TERM GOAL #3   Title  Patient ambulates household around furniture carrying items with cane or less & prosthesis modified independent.     Time  4    Period  Months    Status  On-going    Target Date  03/04/18      PT LONG TERM GOAL #4   Title  Berg Balance >/= 45/56 to indicate lower fall risk.     Time  4    Period  Months    Status  On-going    Target Date  03/04/18      PT LONG TERM GOAL #5   Title  Patient ambulates >1000' with LRAD & prosthesis outdoors including grass, ramps & curbs modified independent.     Time  4    Period  Months    Status  On-going    Target Date  03/04/18      PT LONG TERM  GOAL #6   Title  Patient performs standing ADLs including cooking, laundry, sweeping, etc with prosthesis & LRAD modified indepedent.     Time  4    Period  Months    Status  On-going    Target Date  03/04/18            Plan - 01/05/18 1126    Clinical Impression Statement  Patient met all STGs set for this period. She is functioning with prosthesis with crutches at basic (up to 500') community level. She is wearing prosthesis most of awake hours and is incorporating into her daily activities. She has potential to ambulate with cane at household & basic community level with additional skilled instruction. Patient is in agreement with continuing PT to further improve her balance & mobility with her prosthesis.     Rehab Potential  Good    Clinical Impairments Affecting Rehab Potential  right TFA, arthritis, CAD,A-Fib, PVD, CVA, Glaucoma, migraines, HTN, NSTEMI,  knee pain/left torn meniscus,     PT Frequency  2x / week    PT Duration  8 weeks    PT Treatment/Interventions  ADLs/Self Care Home Management;DME Instruction;Gait training;Stair training;Functional mobility training;Therapeutic activities;Therapeutic exercise;Balance training;Neuromuscular re-education;Patient/family education;Prosthetic Training    PT Next Visit Plan  gait with cane & counter/ table or hand held assist, weight shifting activities, balance activities with cane    Consulted and Agree with Plan of Care  Patient       Patient will benefit from skilled therapeutic intervention in order to improve the following deficits and impairments:  Abnormal gait, Decreased activity tolerance, Decreased balance, Decreased endurance, Decreased knowledge of use of DME, Decreased mobility, Decreased strength, Prosthetic Dependency, Postural dysfunction  Visit Diagnosis: Other abnormalities of gait and mobility  Unsteadiness on feet  Muscle weakness (generalized)  Repeated falls  Amputee, above knee, right  [facility])     Problem List Patient Active Problem List  Diagnosis Date Noted  . Phantom limb pain ([place])   . Hypertensive crisis   . Induration of skin   . Abnormality of gait   . Hypoalbuminemia due to protein-calorie malnutrition ([place])   . Amputee, above knee, right ([place]) 05/25/2017  . Unilateral AKA, right ([place])   . Elevated troponin   . PVD (peripheral vascular disease) ([place])   . Benign essential HTN   . Acute blood loss anemia   . Ischemia of right lower extremity 05/17/2017  . Thrombosis of right femoral artery ([place]) 01/09/2017  . Ischemia of extremity 01/09/2017  . History of colonic polyps 07/30/2016  . Essential hypertension 01/08/2016  . Polycythemia vera ([place]) 08/20/2015  . Leukocytosis 07/15/2015  . S/P arterial stent right SFA overlapping Bahn covered stents 06/27/2015 06/28/2015  . Claudication ([place]) 06/27/2015  . Carotid artery disease ([place]) 08/24/2014  . Osteoarthritis of left knee 03/27/2014  . External hemorrhoids 06/06/2013  . PAF (paroxysmal atrial fibrillation), maintaing SR 05/11/2013  . Chronic anticoagulation, new- eliquis 05/11/2013  . CAD (coronary artery disease), per cath 04/27/13, non obstructive disease 20-30% LM; LAD 30%; 50-60% OM2; RCA 40%; EF 65-70% 05/11/2013  . Mitral regurgitation,2+ by cath 05/11/2013  . Atrial fibrillation with RVR, hx of PAF previously 04/27/2013  . PAD (peripheral artery disease), Lt carotid 50-70%: , Hx stent Lt exteral iliac & RSFA stent, known occl. LSFA  04/27/2013  . Hyperlipidemia 04/27/2013  . History of CVA (cerebrovascular accident) 04/27/2013  . NSTEMI (non-ST elevated myocardial infarction) ([place]) 04/27/2013    WALDRON,ROBIN PT, DPT 01/05/2018, 11:38 AM  [facility] [address] [place], [place], [address] Phone: [phone]   Fax:  [phone]  Name: ENNA WARWICK MRN: [id number] Date of Birth: 05-Dec-1951

## 2018-01-06 ENCOUNTER — Ambulatory Visit (HOSPITAL_COMMUNITY)
Admission: RE | Admit: 2018-01-06 | Discharge: 2018-01-06 | Disposition: A | Discharge status: Home / Self Care | Payer: Medicare Other | Source: Ambulatory Visit | Attending: Oncology | Admitting: Oncology

## 2018-01-06 DIAGNOSIS — D45 Polycythemia vera: Secondary | ICD-10-CM | POA: Insufficient documentation

## 2018-01-06 NOTE — Progress Notes (Signed)
500 ml blood removed from left antecubital area as ordered for therapeutic phlebotomy. Tolerated well. Pt eating graham crackers and drinking at present. Will continue to monitor.

## 2018-01-07 ENCOUNTER — Telehealth: Payer: Self-pay | Admitting: *Deleted

## 2018-01-07 NOTE — Telephone Encounter (Signed)
Pt called / informed "blood count stable " per Dr Beryle Beams. Stated ok and will see [place] Tuesday.

## 2018-01-07 NOTE — Telephone Encounter (Signed)
-----   Message from Annia Belt, MD sent at 01/06/2018  1:45 PM EST ----- Call pt: blood count stable

## 2018-01-10 ENCOUNTER — Ambulatory Visit: Payer: Medicare Other | Admitting: Oncology

## 2018-01-11 ENCOUNTER — Encounter: Payer: Self-pay | Admitting: Oncology

## 2018-01-11 ENCOUNTER — Other Ambulatory Visit: Payer: Self-pay

## 2018-01-11 ENCOUNTER — Ambulatory Visit (INDEPENDENT_AMBULATORY_CARE_PROVIDER_SITE_OTHER): Payer: Medicare Other | Admitting: Oncology

## 2018-01-11 VITALS — BP 169/62 | HR 68 | Temp 97.7°F | Ht 65.5 in | Wt 133.4 lb

## 2018-01-11 DIAGNOSIS — I480 Paroxysmal atrial fibrillation: Secondary | ICD-10-CM | POA: Diagnosis not present

## 2018-01-11 DIAGNOSIS — D45 Polycythemia vera: Secondary | ICD-10-CM

## 2018-01-11 DIAGNOSIS — Z888 Allergy status to other drugs, medicaments and biological substances status: Secondary | ICD-10-CM

## 2018-01-11 DIAGNOSIS — D72828 Other elevated white blood cell count: Secondary | ICD-10-CM

## 2018-01-11 DIAGNOSIS — H409 Unspecified glaucoma: Secondary | ICD-10-CM

## 2018-01-11 DIAGNOSIS — I252 Old myocardial infarction: Secondary | ICD-10-CM | POA: Diagnosis not present

## 2018-01-11 DIAGNOSIS — Z7982 Long term (current) use of aspirin: Secondary | ICD-10-CM

## 2018-01-11 DIAGNOSIS — Z880 Allergy status to penicillin: Secondary | ICD-10-CM

## 2018-01-11 DIAGNOSIS — I739 Peripheral vascular disease, unspecified: Secondary | ICD-10-CM

## 2018-01-11 DIAGNOSIS — D649 Anemia, unspecified: Secondary | ICD-10-CM | POA: Diagnosis not present

## 2018-01-11 DIAGNOSIS — R161 Splenomegaly, not elsewhere classified: Secondary | ICD-10-CM | POA: Diagnosis not present

## 2018-01-11 DIAGNOSIS — I2510 Atherosclerotic heart disease of native coronary artery without angina pectoris: Secondary | ICD-10-CM | POA: Diagnosis not present

## 2018-01-11 DIAGNOSIS — Z89611 Acquired absence of right leg above knee: Secondary | ICD-10-CM | POA: Diagnosis not present

## 2018-01-11 DIAGNOSIS — E785 Hyperlipidemia, unspecified: Secondary | ICD-10-CM

## 2018-01-11 DIAGNOSIS — Z7901 Long term (current) use of anticoagulants: Secondary | ICD-10-CM

## 2018-01-11 DIAGNOSIS — G43909 Migraine, unspecified, not intractable, without status migrainosus: Secondary | ICD-10-CM | POA: Diagnosis not present

## 2018-01-11 DIAGNOSIS — Z885 Allergy status to narcotic agent status: Secondary | ICD-10-CM

## 2018-01-11 DIAGNOSIS — Z87891 Personal history of nicotine dependence: Secondary | ICD-10-CM

## 2018-01-11 NOTE — Patient Instructions (Signed)
Continue lab every 2 months next Monday March 11 Continue monthly phlebotomy at [facility] Stay MD visit with Dr Darnell Level in 4 months

## 2018-01-11 NOTE — Progress Notes (Signed)
Hematology and Oncology Follow Up Visit  [name] [id number] 12-26-1950 67 y.o. 01/11/2018 3:03 PM   Principle Diagnosis: Encounter Diagnoses  Name Primary?  . Polycythemia vera ([place]) Yes  . Other elevated white blood cell (WBC) count    Updated clinical summary: Pleasant 67 year old woman I initially evaluated on 07/15/2015 for unexplained leukocytosis. I initially thought we were dealing with an inflammatory process related to a foreign body reaction to multiple vascular stents, however in reviewing her laboratory data, I noted that she had a chronic anemia with hemoglobins running 12 g with MCV 90 until 03/20/2014 Hemoglobin began to rise first to 14.1 with subsequent values in 2016 up to 16 g recorded on 06/27/2015. She had normal white blood counts back in December 2010. Next white count recorded was in May 2014 at 13,300. Subsequent counts were all 10,000 or above with a count on 07/15/2015 at 17,000 with 85% neutrophils. 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 Her platelet count has been normal. I did not appreciate any organomegaly on my initial exam but on my subsequent exams I was able to palpate an enlarged spleen. discussed the diagnosis of myeloproliferative disorder with her in detail at time of her 08/20/2015 follow-up visit. . Predominant complication would be predisposition towards thrombosis. She was already on aspirin and Xarelto so she is covered in that regard. With respect to thrombotic risk of increased blood viscosity, current guideline recommends keeping hematocrit less than 45%. This should be accomplished very easily in this woman who is peak hematocrit has not exceeded 52%. We discussed aphlebotomy program.I initiated a  phlebotomy program removing of 500 mL's of blood monthly 3 with plans to reevaluate after that. I anticipated seeing a rapid fall in her hematocrit and then be able to space out subsequent phlebotomies to an every 3-4 month interval. She had a good understanding of all we discussed. She had her first phlebotomy Monday, September 02, 2015. She had 4 monthly phlebotomies through 12/02/2015. In February 2017, she underwent a vascular bypass procedure on her right lower extremity and lost a significant amount of blood with hemoglobin falling down as low as 6.6 on February 14. She was given temporary iron supplementation. Hemoglobin rose up to 11 g.  She had a re-thrombosis of a right femoral popliteal bypass graft and underwent urgent thrombectomy January 09, 2017.  She ultimately came to a right above the knee amputation on May 21, 2017.     Interim summary: Overall doing well at this time.  She remains in good spirits.  She has really adjusted well to her right leg amputation.  She has been fitted for a modern prosthesis and is able to ambulate without any external hardware such as a cane or walker.  1 interim emergency department visit in October for evaluation of a bitemporal headache associated with blurred vision.  No acute findings on CT scan of the head.  She was treated for recurrent migraine.  She did not require hospitalization. Most recent phlebotomy was done on January 17. White count remains chronically elevated but in range of prior values.  On the new finding is that the lab reported the presence of 2 myeloblasts on the peripheral blood film from December 31, 2017.  In the absence of anemia or thrombocytopenia and in the context of  her known myeloproliferative disorder, I do not feel that this signals a transformation to acute leukemia at this time.    Medications: reviewed  Allergies:  Allergies  Allergen Reactions  . Atenolol Rash and Itching  . Demerol [Meperidine] Other (See  Comments) and Itching    Unknown, can't remember  Unknown, can't remember   . Inderal [Propranolol] Other (See Comments) and Itching    Doesn't remember   . Prednisone Other (See Comments)    Muscle spasms  . Statins Itching and Other (See Comments)    Simvastatin-caused severe itching Pravastatin-caused lesser tiching One type of statin? Caused stroke in 2006, and other symptoms as result  . Warfarin And Related Other (See Comments)    Caused nose bleeds  . Crestor [Rosuvastatin] Itching and Rash  . Lactose Intolerance (Gi) Other (See Comments)    Bloating, gas  . Lipitor [Atorvastatin] Rash  . Lovaza [Omega-3-Acid Ethyl Esters] Other (See Comments)    nosebleeds   . Penicillins Hives, Rash and Other (See Comments)    Has patient had a PCN reaction causing immediate rash, facial/tongue/throat swelling, SOB or lightheadedness with hypotension: Yes Has patient had a PCN reaction causing severe rash involving mucus membranes or skin necrosis: No Has patient had a PCN reaction that required hospitalization: No Has patient had a PCN reaction occurring within the last 10 years: No If all of the above answers are "NO", then may proceed with Cephalosporin use.  Questionable high fever   . Pravastatin Itching and Rash  . Simvastatin Itching, Rash and Other (See Comments)  . Trilipix [Choline Fenofibrate] Other (See Comments)    Doesn't remember   . Warfarin Other (See Comments)    nosebleeds  . Hydralazine Swelling  . Effexor [Venlafaxine Hcl] Other (See Comments)    Doesn't remember   . Nexium [Esomeprazole Magnesium] Rash  . Percocet [Oxycodone-Acetaminophen] Other (See Comments)    Doesn't remember   . Plavix [Clopidogrel Bisulfate] Rash    Review of Systems:  Remaining ROS negative:   Physical Exam: Blood pressure (!) 169/62, pulse 68, temperature 97.7 F (36.5 C), temperature source Oral, height 5' 5.5" (1.664 m), weight 133 lb 6.4 oz (60.5 kg), SpO2 100 %. Wt Readings  from Last 3 Encounters:  01/11/18 133 lb 6.4 oz (60.5 kg)  01/06/18 129 lb (58.5 kg)  11/04/17 126 lb 2 oz (57.2 kg)     General appearance: Well-nourished Caucasian woman HENNT: Pharynx no erythema, exudate, mass, or ulcer. No thyromegaly or thyroid nodules Lymph nodes: No cervical, supraclavicular, or axillary lymphadenopathy Breasts: Lungs: Clear to auscultation, resonant to percussion throughout Heart: Regular rhythm, no murmur, no gallop, no rub, no click, no edema Abdomen: Soft, tender in the right upper quadrant., normal bowel sounds, no mass, spleen palpable 5 cm and liver 3 cm below the costal margins on the left and right respectively. Extremities: Status post right BKA, prosthesis in place.  Left lower extremity: No edema, no calf tenderness Musculoskeletal: no joint deformities GU:  Vascular: Carotid pulses 2+, no bruits,  Neurologic: Alert, oriented, PERRLA,  cranial nerves grossly normal, motor strength 5 over 5, bilateral upper extremities and left lower extremity., upper body coordination normal, gait normal, Skin: No rash or ecchymosis  Lab Results: CBC W/Diff    Component Value Date/Time   WBC 28.3 (HH) 12/31/2017 1119   WBC 27.1 (H) 09/22/2017 1835   RBC 6.67 (H) 12/31/2017 1119   RBC 7.61 (H) 09/22/2017 1835   HGB 14.3 12/31/2017 1119  HCT 46.8 (H) 12/31/2017 1119   PLT 407 (H) 12/31/2017 1119   MCV 70 (L) 12/31/2017 1119   MCH 21.4 (L) 12/31/2017 1119   MCH 24.2 (L) 09/22/2017 1835   MCHC 30.6 (L) 12/31/2017 1119   MCHC 30.7 09/22/2017 1835   RDW 19.4 (H) 12/31/2017 1119   LYMPHSABS 2.8 12/31/2017 1119   MONOABS 0.5 09/22/2017 1835   EOSABS 1.1 (H) 12/31/2017 1119   BASOSABS 0.6 (H) 12/31/2017 1119     Chemistry      Component Value Date/Time   NA 137 09/22/2017 1835   NA 140 07/06/2017 1113   K 4.7 09/22/2017 1835   CL 100 (L) 09/22/2017 1835   CO2 26 09/22/2017 1835   BUN 17 09/22/2017 1835   BUN 30 (H) 07/06/2017 1113   CREATININE 0.79  09/22/2017 1835   CREATININE 1.33 (H) 03/03/2016 1531      Component Value Date/Time   CALCIUM 9.5 09/22/2017 1835   ALKPHOS 71 07/06/2017 1113   AST 22 07/06/2017 1113   ALT 18 07/06/2017 1113   BILITOT <0.2 07/06/2017 1113       Radiological Studies: No results found.  Impression:  1.  Polycythemia vera Stable on phlebotomy, Eliquis, and aspirin.  Continue periodic phlebotomy.  Currently back on a monthly schedule.  2.  Leukocytosis and splenomegaly secondary to 1 above. Early myeloid cells including a rare blast noted on current peripheral blood film.  No sign that she is evolving into acute myeloid leukemia at this time.  Polycythemia vera is a stem cell disorder that can transform into leukemia but the more common evolution is to develop fibrosis in the marrow with progressive cytopenias which we are not seeing at this time.  We will continue to monitor her blood counts over time.  3.  Peripheral vascular disease status post vascular bypass surgery status post right above the knee amputation.  4.  Paroxysmal atrial fibrillation on long-term anticoagulation  5.  Coronary artery disease with history of MI May 2014  6.  Cerebrovascular disease with bilateral carotid stenosis.  History of TIA.  7.  Hyperlipidemia  8.  Glaucoma  CC: Patient Care Team: Dione Housekeeper, MD as PCP - General (Family Medicine) Lorretta Harp, MD as Consulting Physician (Cardiology) Annia Belt, MD as Consulting Physician (Oncology) [name], MD as Consulting Physician (Vascular Surgery)   Murriel Hopper, MD, [place]  Hematology-Oncology/Internal Medicine     1/22/20193:03 PM

## 2018-01-12 ENCOUNTER — Encounter: Payer: Self-pay | Admitting: Physical Therapy

## 2018-01-12 ENCOUNTER — Ambulatory Visit: Payer: Medicare Other | Admitting: Physical Therapy

## 2018-01-12 DIAGNOSIS — R2689 Other abnormalities of gait and mobility: Secondary | ICD-10-CM | POA: Diagnosis not present

## 2018-01-12 DIAGNOSIS — R2681 Unsteadiness on feet: Secondary | ICD-10-CM

## 2018-01-12 DIAGNOSIS — M6281 Muscle weakness (generalized): Secondary | ICD-10-CM

## 2018-01-12 NOTE — Therapy (Signed)
[facility] [address] [place], [place], [address] Phone: [phone]   Fax:  [phone]  Physical Therapy Treatment  Patient Details  Name: [name] MRN: [id number] Date of Birth: 1951-10-13 Referring Provider: Jamse Arn, MD   Encounter Date: 01/12/2018  PT End of Session - 01/12/18 1319    Visit Number  14    Number of Visits  29    Date for PT Re-Evaluation  03/04/18    Authorization Type  Medicare progress note every 94TM visit recert sent visit 13    PT Start Time  0930    PT Stop Time  1015    PT Time Calculation (min)  45 min    Equipment Utilized During Treatment  Gait belt    Activity Tolerance  Patient tolerated treatment well    Behavior During Therapy  [facility] for tasks assessed/performed       Past Medical History:  Diagnosis Date  . Arthritis    "fingers, some in my knees" (01/28/2016)  . CAD (coronary artery disease), per cath 04/27/13, non obstructive disease, EF 65-70% 05/11/2013  . Carotid disease, bilateral ([place]) 09/2013   Lt carotid 50-69% stentosis, less on rt  . Dysrhythmia   . Fatty liver   . GERD (gastroesophageal reflux disease)   . Glaucoma   . H/O cardiovascular stress test 12/27/2009   negative for ischemia  . Headache    "occasional since eye pressure regulated" (01/28/2016)  . Hyperlipidemia   . Hypertension   . Leukocytosis 07/15/2015  . Migraine    "stopped w/laser holes to relieve pressure in my eyes; had them 3-4 times/wk before taht" (01/28/2016)  . Mitral regurgitation,2+ by cath 05/11/2013  . NSTEMI (non-ST elevated myocardial infarction) ([place]) 04/27/2013  . Pain    BOTH KNEES - PT HAS TORN MENISCUS LEFT KNEE  . Paroxysmal atrial fibrillation (HCC)    hx of a fib on ASA  AND ELIQUIS   . Polycythemia vera ([place]) 08/20/2015   JAK-2 positive 07/19/15  . PVD (peripheral vascular disease) with claudication ([place]) 07/2006   previous L ext iliac stent and rt SFA stent, known  occluded Lt SFA  . S/P cardiac cath 04/27/13   NON OBSTRUCTIVE DISEASE, 2+ mr, ef 65-70%  . Statin intolerance   . Stroke ([place]) 2006   SWELLING ALL OVER AND RT SIDE OF FACE DRAWN AND SPEECH SLURRED AND NUMBNESS ON RIGHT SIDE-- ALL RESOLVED    Past Surgical History:  Procedure Laterality Date  . AMPUTATION Right 05/21/2017   Procedure: AMPUTATION ABOVE KNEE;  Surgeon: Rosetta Posner, MD;  Location: [facility];  Service: Vascular;  Laterality: Right;  . CARDIAC CATHETERIZATION  04/27/13   non occlusive disease with mild to mod. calcified lesions in ostial LM and proximal LAD and moderate prox. RC AND DISTAL AV GROOVE LCX, ef  . CATARACT EXTRACTION W/PHACO Right 03/21/2015   Procedure: CATARACT EXTRACTION PHACO AND INTRAOCULAR LENS PLACEMENT RIGHT EYE;  Surgeon: Baruch Goldmann, MD;  Location: [facility];  Service: Ophthalmology;  Laterality: Right;  CDE:5.10  . CATARACT EXTRACTION W/PHACO Left 05/16/2015   Procedure: CATARACT EXTRACTION PHACO AND INTRAOCULAR LENS PLACEMENT (IOC);  Surgeon: Baruch Goldmann, MD;  Location: [facility];  Service: Ophthalmology;  Laterality: Left;  CDE 5.57  . [place]  . COLONOSCOPY N/A 09/11/2016   Procedure: COLONOSCOPY;  Surgeon: Rogene Houston, MD;  [facility];  Service: Endoscopy;  Laterality: N/A;  730-moved to 1:00 Lelon Frohlich  notified pt  . EMBOLECTOMY Right 02/01/2016   Procedure: Thrombectomy of Right Femoral-Popliteal Bypass Graft; Endarterectomy of Right Below Knee Popliteal Artery and Tibial Peroneal Trunk with Bovine Pericardium Patch Angioplasty.;  Surgeon: Angelia Mould, MD;  Location: [facility];  Service: Vascular;  Laterality: Right;  . FEMORAL-POPLITEAL BYPASS GRAFT Right 01/31/2016   Procedure: BYPASS GRAFT RIGHT COMMON  FEMORALTO BELOW KNEE POPLITEAL ARTERY BYPASS GRAFT USING 6MM PROPATEN GORTEX GRAFT;  Surgeon: Mal Misty, MD;  Location: [facility];  Service: Vascular;  Laterality: Right;  . FEMORAL-POPLITEAL BYPASS GRAFT Right 01/09/2017    Procedure: THROMBECTOMY OF RIGHT FEMORAL-POPLITEAL  ARTERY BYPASS GRAFT; THROMBECTOMY RIGHT  TIBIAL VESSELS;  Surgeon: [name], MD;  Location: [facility];  Service: Vascular;  Laterality: Right;  . FEMORAL-POPLITEAL BYPASS GRAFT Right 05/17/2017   Procedure: RIGHT FEMORAL-TIBIAL PERONEAL TRUNK ARTERY BYPASS GRAFT USING 62mX80cm PROPATEN GRAFT WITH REMOVABLE RING;  Surgeon: ERosetta Posner MD;  [place] [facility]  Service: Vascular;  Laterality: Right;  . FRACTURE SURGERY    . ILIAC ARTERY STENT  07/2006   external iliac and Rt SFA stent 07/2006 AND THE LEFT WAS IN 2006  . KNEE ARTHROSCOPY WITH MEDIAL MENISECTOMY Left 03/27/2014   Procedure: left knee arthorscopy with medial chondraplasty of the medial femoral and patella, medial microfracture technique of medial femoral condyl;  Surgeon: RTobi Bastos MD;  [facility];  Service: Orthopedics;  Laterality: Left;  . LEFT HEART CATHETERIZATION WITH CORONARY ANGIOGRAM Right 04/27/2013   Procedure: LEFT HEART CATHETERIZATION WITH CORONARY ANGIOGRAM;  Surgeon: DLeonie Man MD;  [facility];  Service: Cardiovascular;  Laterality: Right;  . LOWER EXTREMITY ANGIOGRAM Right 01/31/2016   Procedure: INTRAOP RIGHT LOWER EXTREMITY ANGIOGRAM;  Surgeon: JMal Misty MD;  [facility] [facility]  Service: Vascular;  Laterality: Right;  . ORIF WRIST FRACTURE Right 1983  . OVARIAN CYST REMOVAL Right   . PATCH ANGIOPLASTY Right 01/31/2016   Procedure: RIGHT COMMON FEMORAL AND PROFUNDA FEMORIS ENDARECTOMY WITH PATCH ANGIOPLASTY;  Surgeon: JMal Misty MD;  [place] [facility]  Service: Vascular;  Laterality: Right;  . PATCH ANGIOPLASTY Right 01/09/2017   Procedure: PATCH ANGIOPLASTY RIGHT POPLITEAL ARTERY BYPASS GRAFT;  Surgeon: CElam Dutch MD;  [facility]  Service: Vascular;  Laterality: Right;  . PERIPHERAL VASCULAR CATHETERIZATION N/A 06/27/2015   Procedure: Lower Extremity Angiography;  Surgeon: JLorretta Harp MD;  [facility];  Service: Cardiovascular;  Laterality: N/A;  . PERIPHERAL VASCULAR CATHETERIZATION Bilateral 01/29/2016   Procedure: Lower Extremity Angiography;  Surgeon: MWellington Hampshire MD;  [facility];  Service: Cardiovascular;  Laterality: Bilateral;  . PERIPHERAL VASCULAR CATHETERIZATION N/A 01/29/2016   Procedure: Abdominal Aortogram;  Surgeon: MWellington Hampshire MD;  [facility];  Service: Cardiovascular;  Laterality: N/A;  . REFRACTIVE SURGERY Bilateral    "6 in one eye; 7 in the other; to relieve pressure; not glaucoma" (01/28/2016)  . SFA Right 06/27/2015   overlapping Bahn covered stents  . TUBAL LIGATION  1983  . VEIN HARVEST Left 05/17/2017   Procedure: LEFT LEG GREATER SAPHENOUS VEIN HARVEST;  Surgeon: ERosetta Posner MD;  [place]  Service: Vascular;  Laterality: Left;  .Marland KitchenVEIN REPAIR Right 01/31/2016   Procedure: RIGHT GREATER SAPHENOUS VEIN EXAMNED BUT NOT REMOVED;  Surgeon: JMal Misty MD;  [place] [facility]  Service: Vascular;  Laterality: Right;    There were no vitals filed for this  visit.  Subjective Assessment - 01/12/18 0937    Subjective  She donnes prosthesis ~1hr after she arises while getting grandchildren out door for school, wears prosthesis ~12hrs, then takes it off 2-3hrs prior to going to bed.     Pertinent History  right TFA, arthritis, CAD,A-Fib, PVD, CVA, Glaucoma, migraines, HTN, NSTEMI,  knee pain/left torn meniscus,     Limitations  Lifting;Standing;Walking    Patient Stated Goals  To use prosthesis walking. Previously 2-4 miles.     Currently in Pain?  Yes    Pain Score  3     Pain Location  Leg residual limb    Pain Orientation  Right;Distal    Pain Descriptors / Indicators  Discomfort;Tender    Pain Type  Acute pain    Pain Onset  Yesterday    Aggravating Factors   increased distance of prosthetic gait with a doctor's appointment in hospital    Pain Relieving Factors  rest & over the counter med                       [facility] Adult PT Treatment/Exercise - 01/12/18 0930      Transfers   Transfers  Sit to Stand;Stand to Sit    Sit to Stand  5: Supervision;With upper extremity assist;With armrests;From chair/3-in-1 Pt able to stabilize without touching object for support.     Stand to Sit  5: Supervision;With upper extremity assist;With armrests;To chair/3-in-1 No object for support      Ambulation/Gait   Ambulation/Gait  Yes    Ambulation/Gait Assistance  4: Min assist    Ambulation Distance (Feet)  100 Feet 100', 30' X 3    Assistive device  Prosthesis;Straight cane quad tip    Gait Pattern  Step-through pattern;Decreased step length - left;Decreased stance time - right;Decreased stride length    Ambulation Surface  Indoor;Level      Self-Care   Self-Care  ADL's;Lifting    ADL's  carrying plate, then cup between counter & table (10' apart) using cane & prosthesis with minA.     Lifting  Pt touches floor without UE support with min guard.       Prosthetics   Prosthetic Care Comments   Increase wear to all awake hours. PT instructed in signs of sweating, sweat management including antiperspirant use and drying limb/liner to minimize skin issues.     Current prosthetic wear tolerance (days/week)   daily    Current prosthetic wear tolerance (#hours/day)   12 hrs straight.     Current prosthetic weight-bearing tolerance (hours/day)   distal limb pain from increased walking at doctor appointment yesterday.     Education Provided  Correct ply sock adjustment;Proper wear schedule/adjustment;Proper weight-bearing schedule/adjustment;Other (comment) see prosthetic care comments.    Person(s) Educated  Patient    Education Method  Explanation;Verbal cues    Education Method  Verbalized understanding;Verbal cues required;Needs further instruction               PT Short Term Goals - 01/12/18 1323      PT SHORT TERM GOAL #1   Title  Patient verbalizes & demonstrates  proper donning with correct rotation.  (All STGs Target Date: 02/03/2018)    Time  4    Period  Weeks    Status  On-going    Target Date  02/03/18      PT SHORT TERM GOAL #2   Title  Patient able to reach 5" & to floor  with cane support with supervision.     Time  4    Period  Weeks    Status  New      PT SHORT TERM GOAL #3   Title  Patient demonstrates standing ADLs like cooking in kitchen or laundry with prosthesis with supervision.     Time  4    Period  Weeks    Status  New      PT SHORT TERM GOAL #4   Title  Patient ambulates 200' with cane & prosthesis with supervision.     Time  4    Period  Weeks    Status  New      PT SHORT TERM GOAL #5   Title  Patient negotiates stairs (1 rails), ramps & curbs with cane & prosthesis with minimal assist    Time  4    Period  Weeks    Status  New        PT Long Term Goals - 01/05/18 1135      PT LONG TERM GOAL #1   Title  Patient verbalizes & demonstrates proper prosthetic care to enable safe use of prosthesis. (All LTGs Target Date: 03/04/2018)    Time  4    Period  Months    Status  On-going    Target Date  03/04/18      PT LONG TERM GOAL #2   Title  Patient tolerates wear of prosthesis >90% of awake hours without skin issues or limb pain to enable function during her day.     Time  4    Period  Months    Status  On-going    Target Date  03/04/18      PT LONG TERM GOAL #3   Title  Patient ambulates household around furniture carrying items with cane or less & prosthesis modified independent.     Time  4    Period  Months    Status  On-going    Target Date  03/04/18      PT LONG TERM GOAL #4   Title  Berg Balance >/= 45/56 to indicate lower fall risk.     Time  4    Period  Months    Status  On-going    Target Date  03/04/18      PT LONG TERM GOAL #5   Title  Patient ambulates >1000' with LRAD & prosthesis outdoors including grass, ramps & curbs modified independent.     Time  4    Period  Months    Status   On-going    Target Date  03/04/18      PT LONG TERM GOAL #6   Title  Patient performs standing ADLs including cooking, laundry, sweeping, etc with prosthesis & LRAD modified indepedent.     Time  4    Period  Months    Status  On-going    Target Date  03/04/18            Plan - 01/12/18 1323    Clinical Impression Statement  Patient improved prosthetic gait with cane carrying plate & cup between table & counter. Patient has improved understanding of sweat management to minimize sweat management.     Rehab Potential  Good    Clinical Impairments Affecting Rehab Potential  right TFA, arthritis, CAD,A-Fib, PVD, CVA, Glaucoma, migraines, HTN, NSTEMI,  knee pain/left torn meniscus,     PT Frequency  2x / week    PT  Duration  8 weeks    PT Treatment/Interventions  ADLs/Self Care Home Management;DME Instruction;Gait training;Stair training;Functional mobility training;Therapeutic activities;Therapeutic exercise;Balance training;Neuromuscular re-education;Patient/family education;Prosthetic Training    PT Next Visit Plan  gait with cane & counter/ table or hand held assist, weight shifting activities, balance activities with cane    Consulted and Agree with Plan of Care  Patient       Patient will benefit from skilled therapeutic intervention in order to improve the following deficits and impairments:  Abnormal gait, Decreased activity tolerance, Decreased balance, Decreased endurance, Decreased knowledge of use of DME, Decreased mobility, Decreased strength, Prosthetic Dependency, Postural dysfunction  Visit Diagnosis: Other abnormalities of gait and mobility  Unsteadiness on feet  Muscle weakness (generalized)     Problem List Patient Active Problem List   Diagnosis Date Noted  . Phantom limb pain ([place])   . Hypertensive crisis   . Induration of skin   . Abnormality of gait   . Hypoalbuminemia due to protein-calorie malnutrition ([place])   . Amputee, above knee, right ([place])  05/25/2017  . Unilateral AKA, right ([place])   . Elevated troponin   . PVD (peripheral vascular disease) ([place])   . Benign essential HTN   . Acute blood loss anemia   . Ischemia of right lower extremity 05/17/2017  . Thrombosis of right femoral artery ([place]) 01/09/2017  . Ischemia of extremity 01/09/2017  . History of colonic polyps 07/30/2016  . Essential hypertension 01/08/2016  . Polycythemia vera ([place]) 08/20/2015  . Leukocytosis 07/15/2015  . S/P arterial stent right SFA overlapping Bahn covered stents 06/27/2015 06/28/2015  . Claudication ([place]) 06/27/2015  . Carotid artery disease ([place]) 08/24/2014  . Osteoarthritis of left knee 03/27/2014  . External hemorrhoids 06/06/2013  . PAF (paroxysmal atrial fibrillation), maintaing SR 05/11/2013  . Chronic anticoagulation, new- eliquis 05/11/2013  . CAD (coronary artery disease), per cath 04/27/13, non obstructive disease 20-30% LM; LAD 30%; 50-60% OM2; RCA 40%; EF 65-70% 05/11/2013  . Mitral regurgitation,2+ by cath 05/11/2013  . Atrial fibrillation with RVR, hx of PAF previously 04/27/2013  . PAD (peripheral artery disease), Lt carotid 50-70%: , Hx stent Lt exteral iliac & RSFA stent, known occl. LSFA  04/27/2013  . Hyperlipidemia 04/27/2013  . History of CVA (cerebrovascular accident) 04/27/2013  . NSTEMI (non-ST elevated myocardial infarction) ([place]) 04/27/2013    Yonah Tangeman PT, DPT 01/12/2018, 1:30 PM  [facility] [address] [place], [place], [address] Phone: [phone]   Fax:  [phone]  Name: MARIONETTE MESKILL MRN: [id number] Date of Birth: [birth year]

## 2018-01-14 ENCOUNTER — Encounter: Payer: Self-pay | Admitting: Physical Therapy

## 2018-01-14 ENCOUNTER — Ambulatory Visit: Payer: Medicare Other | Admitting: Physical Therapy

## 2018-01-14 DIAGNOSIS — R2689 Other abnormalities of gait and mobility: Secondary | ICD-10-CM

## 2018-01-14 DIAGNOSIS — M6281 Muscle weakness (generalized): Secondary | ICD-10-CM

## 2018-01-14 DIAGNOSIS — R2681 Unsteadiness on feet: Secondary | ICD-10-CM

## 2018-01-14 NOTE — Therapy (Signed)
[facility] [address] [place], [place], [address] Phone: [phone]   Fax:  [phone]  Physical Therapy Treatment  Patient Details  Name: [name] MRN: [id number] Date of Birth: [birth year] Referring Provider: Jamse Arn, MD   Encounter Date: 01/14/2018  PT End of Session - 01/14/18 0941    Visit Number  15    Number of Visits  29    Date for PT Re-Evaluation  03/04/18    Authorization Type  Medicare progress note every 60FU visit recert sent visit 13    PT Start Time  0932    PT Stop Time  1015    PT Time Calculation (min)  43 min    Equipment Utilized During Treatment  Gait belt    Activity Tolerance  Patient tolerated treatment well    Behavior During Therapy  [facility] for tasks assessed/performed       Past Medical History:  Diagnosis Date  . Arthritis    "fingers, some in my knees" (01/28/2016)  . CAD (coronary artery disease), per cath 04/27/13, non obstructive disease, EF 65-70% 05/11/2013  . Carotid disease, bilateral ([place]) 09/2013   Lt carotid 50-69% stentosis, less on rt  . Dysrhythmia   . Fatty liver   . GERD (gastroesophageal reflux disease)   . Glaucoma   . H/O cardiovascular stress test 12/27/2009   negative for ischemia  . Headache    "occasional since eye pressure regulated" (01/28/2016)  . Hyperlipidemia   . Hypertension   . Leukocytosis 07/15/2015  . Migraine    "stopped w/laser holes to relieve pressure in my eyes; had them 3-4 times/wk before taht" (01/28/2016)  . Mitral regurgitation,2+ by cath 05/11/2013  . NSTEMI (non-ST elevated myocardial infarction) ([place]) 04/27/2013  . Pain    BOTH KNEES - PT HAS TORN MENISCUS LEFT KNEE  . Paroxysmal atrial fibrillation (HCC)    hx of a fib on ASA  AND ELIQUIS   . Polycythemia vera ([place]) 08/20/2015   JAK-2 positive 07/19/15  . PVD (peripheral vascular disease) with claudication ([place]) 07/2006   previous L ext iliac stent and rt SFA stent, known  occluded Lt SFA  . S/P cardiac cath 04/27/13   NON OBSTRUCTIVE DISEASE, 2+ mr, ef 65-70%  . Statin intolerance   . Stroke ([place]) 2006   SWELLING ALL OVER AND RT SIDE OF FACE DRAWN AND SPEECH SLURRED AND NUMBNESS ON RIGHT SIDE-- ALL RESOLVED    Past Surgical History:  Procedure Laterality Date  . AMPUTATION Right 05/21/2017   Procedure: AMPUTATION ABOVE KNEE;  Surgeon: Rosetta Posner, MD;  Location: [facility];  Service: Vascular;  Laterality: Right;  . CARDIAC CATHETERIZATION  04/27/13   non occlusive disease with mild to mod. calcified lesions in ostial LM and proximal LAD and moderate prox. RC AND DISTAL AV GROOVE LCX, ef  . CATARACT EXTRACTION W/PHACO Right 03/21/2015   Procedure: CATARACT EXTRACTION PHACO AND INTRAOCULAR LENS PLACEMENT RIGHT EYE;  Surgeon: Baruch Goldmann, MD;  Location[facility];  Service: Ophthalmology;  Laterality: Right;  CDE:5.10  . CATARACT EXTRACTION W/PHACO Left 05/16/2015   Procedure: CATARACT EXTRACTION PHACO AND INTRAOCULAR LENS PLACEMENT (IOC);  Surgeon: Baruch Goldmann, MD;  Location: [facility];  Service: Ophthalmology;  Laterality: Left;  CDE 5.57  . [place]  . COLONOSCOPY N/A 09/11/2016   Procedure: COLONOSCOPY;  Surgeon: Rogene Houston, MD;  [facility];  Service: Endoscopy;  Laterality: N/A;  730-moved to 1:00 Lelon Frohlich  notified pt  . EMBOLECTOMY Right 02/01/2016   Procedure: Thrombectomy of Right Femoral-Popliteal Bypass Graft; Endarterectomy of Right Below Knee Popliteal Artery and Tibial Peroneal Trunk with Bovine Pericardium Patch Angioplasty.;  Surgeon: Angelia Mould, MD;  Location: [facility];  Service: Vascular;  Laterality: Right;  . FEMORAL-POPLITEAL BYPASS GRAFT Right 01/31/2016   Procedure: BYPASS GRAFT RIGHT COMMON  FEMORALTO BELOW KNEE POPLITEAL ARTERY BYPASS GRAFT USING 6MM PROPATEN GORTEX GRAFT;  Surgeon: Mal Misty, MD;  Location: [facility];  Service: Vascular;  Laterality: Right;  . FEMORAL-POPLITEAL BYPASS GRAFT Right 01/09/2017    Procedure: THROMBECTOMY OF RIGHT FEMORAL-POPLITEAL  ARTERY BYPASS GRAFT; THROMBECTOMY RIGHT  TIBIAL VESSELS;  Surgeon: [name], MD;  Location: [facility];  Service: Vascular;  Laterality: Right;  . FEMORAL-POPLITEAL BYPASS GRAFT Right 05/17/2017   Procedure: RIGHT FEMORAL-TIBIAL PERONEAL TRUNK ARTERY BYPASS GRAFT USING 62mX80cm PROPATEN GRAFT WITH REMOVABLE RING;  Surgeon: ERosetta Posner MD;  [place] [facility]  Service: Vascular;  Laterality: Right;  . FRACTURE SURGERY    . ILIAC ARTERY STENT  07/2006   external iliac and Rt SFA stent 07/2006 AND THE LEFT WAS IN 2006  . KNEE ARTHROSCOPY WITH MEDIAL MENISECTOMY Left 03/27/2014   Procedure: left knee arthorscopy with medial chondraplasty of the medial femoral and patella, medial microfracture technique of medial femoral condyl;  Surgeon: RTobi Bastos MD;  [facility];  Service: Orthopedics;  Laterality: Left;  . LEFT HEART CATHETERIZATION WITH CORONARY ANGIOGRAM Right 04/27/2013   Procedure: LEFT HEART CATHETERIZATION WITH CORONARY ANGIOGRAM;  Surgeon: DLeonie Man MD;  [facility];  Service: Cardiovascular;  Laterality: Right;  . LOWER EXTREMITY ANGIOGRAM Right 01/31/2016   Procedure: INTRAOP RIGHT LOWER EXTREMITY ANGIOGRAM;  Surgeon: JMal Misty MD;  [place] [facility]  Service: Vascular;  Laterality: Right;  . ORIF WRIST FRACTURE Right 1983  . OVARIAN CYST REMOVAL Right   . PATCH ANGIOPLASTY Right 01/31/2016   Procedure: RIGHT COMMON FEMORAL AND PROFUNDA FEMORIS ENDARECTOMY WITH PATCH ANGIOPLASTY;  Surgeon: JMal Misty MD;  [place] [facility]  Service: Vascular;  Laterality: Right;  . PATCH ANGIOPLASTY Right 01/09/2017   Procedure: PATCH ANGIOPLASTY RIGHT POPLITEAL ARTERY BYPASS GRAFT;  Surgeon: CElam Dutch MD;  [facility]  Service: Vascular;  Laterality: Right;  . PERIPHERAL VASCULAR CATHETERIZATION N/A 06/27/2015   Procedure: Lower Extremity Angiography;  Surgeon: JLorretta Harp MD;  [facility];  Service: Cardiovascular;  Laterality: N/A;  . PERIPHERAL VASCULAR CATHETERIZATION Bilateral 01/29/2016   Procedure: Lower Extremity Angiography;  Surgeon: MWellington Hampshire MD;  [facility];  Service: Cardiovascular;  Laterality: Bilateral;  . PERIPHERAL VASCULAR CATHETERIZATION N/A 01/29/2016   Procedure: Abdominal Aortogram;  Surgeon: MWellington Hampshire MD;  [facility];  Service: Cardiovascular;  Laterality: N/A;  . REFRACTIVE SURGERY Bilateral    "6 in one eye; 7 in the other; to relieve pressure; not glaucoma" (01/28/2016)  . SFA Right 06/27/2015   overlapping Bahn covered stents  . TUBAL LIGATION  1983  . VEIN HARVEST Left 05/17/2017   Procedure: LEFT LEG GREATER SAPHENOUS VEIN HARVEST;  Surgeon: ERosetta Posner MD;  [facility]  Service: Vascular;  Laterality: Left;  .Marland KitchenVEIN REPAIR Right 01/31/2016   Procedure: RIGHT GREATER SAPHENOUS VEIN EXAMNED BUT NOT REMOVED;  Surgeon: JMal Misty MD;  [place] [facility]  Service: Vascular;  Laterality: Right;    There were no vitals filed for this  visit.  Subjective Assessment - 01/14/18 0935    Subjective  No new compaints. No falls. Working on increasing to all awake hours for wearing the prosthesis. No pain currently, just discomfort when she stands.     Pertinent History  right TFA, arthritis, CAD,A-Fib, PVD, CVA, Glaucoma, migraines, HTN, NSTEMI,  knee pain/left torn meniscus,     Limitations  Lifting;Standing;Walking    Patient Stated Goals  To use prosthesis walking. Previously 2-4 miles.     Currently in Pain?  No/denies    Pain Score  0-No pain    Pain Location  Leg       [facility] Adult PT Treatment/Exercise - 01/14/18 0942      Transfers   Transfers  Sit to Stand;Stand to Sit    Sit to Stand  5: Supervision;With upper extremity assist;With armrests;From chair/3-in-1    Sit to Stand Details  Verbal cues for safe use of DME/AE;Verbal cues for sequencing    Stand to Sit  5: Supervision;With upper  extremity assist;With armrests;To chair/3-in-1    Stand to Sit Details (indicate cue type and reason)  Verbal cues for sequencing;Verbal cues for safe use of DME/AE      Ambulation/Gait   Ambulation/Gait  Yes    Ambulation/Gait Assistance  4: Min assist    Ambulation/Gait Assistance Details  cues for posture, to bend prosthetic knee with swing phase, step length and weight shifting with gait.                              Ambulation Distance (Feet)  120 Feet x1, plus around gym with barriers    Assistive device  Prosthesis;Straight cane with rubber quad tip    Gait Pattern  Step-through pattern;Decreased step length - left;Decreased stance time - right;Decreased stride length    Ambulation Surface  Level;Indoor    Stairs  Yes    Stairs Assistance  4: Min guard    Stairs Assistance Details (indicate cue type and reason)  cues for hand advancement along rails, weight shifting and cane placement     Stair Management Technique  One rail Right;One rail Left;Step to pattern;Forwards;With cane    Number of Stairs  4 x2 reps    Ramp  4: Min assist    Ramp Details (indicate cue type and reason)  with cane/prosthesis: cues for sequencing, weight shifting, cane placement and technique.       Prosthetics   Current prosthetic wear tolerance (days/week)   daily    Current prosthetic wear tolerance (#hours/day)   12 hrs straight. working toward all awake hours     Residual limb condition   intact per pt    Education Provided  Proper wear schedule/adjustment;Proper weight-bearing schedule/adjustment;Residual limb care;Correct ply sock adjustment    Person(s) Educated  Patient    Education Method  Explanation;Verbal cues    Education Method  Verbal cues required;Needs further instruction;Verbalized understanding    Donning Prosthesis  Supervision    Doffing Prosthesis  Modified independent (device/increased time)          PT Short Term Goals - 01/12/18 1323      PT SHORT TERM GOAL #1   Title   Patient verbalizes & demonstrates proper donning with correct rotation.  (All STGs Target Date: 02/03/2018)    Time  4    Period  Weeks    Status  On-going    Target Date  02/03/18  PT SHORT TERM GOAL #2   Title  Patient able to reach 5" & to floor with cane support with supervision.     Time  4    Period  Weeks    Status  New      PT SHORT TERM GOAL #3   Title  Patient demonstrates standing ADLs like cooking in kitchen or laundry with prosthesis with supervision.     Time  4    Period  Weeks    Status  New      PT SHORT TERM GOAL #4   Title  Patient ambulates 200' with cane & prosthesis with supervision.     Time  4    Period  Weeks    Status  New      PT SHORT TERM GOAL #5   Title  Patient negotiates stairs (1 rails), ramps & curbs with cane & prosthesis with minimal assist    Time  4    Period  Weeks    Status  New        PT Long Term Goals - 01/05/18 1135      PT LONG TERM GOAL #1   Title  Patient verbalizes & demonstrates proper prosthetic care to enable safe use of prosthesis. (All LTGs Target Date: 03/04/2018)    Time  4    Period  Months    Status  On-going    Target Date  03/04/18      PT LONG TERM GOAL #2   Title  Patient tolerates wear of prosthesis >90% of awake hours without skin issues or limb pain to enable function during her day.     Time  4    Period  Months    Status  On-going    Target Date  03/04/18      PT LONG TERM GOAL #3   Title  Patient ambulates household around furniture carrying items with cane or less & prosthesis modified independent.     Time  4    Period  Months    Status  On-going    Target Date  03/04/18      PT LONG TERM GOAL #4   Title  Berg Balance >/= 45/56 to indicate lower fall risk.     Time  4    Period  Months    Status  On-going    Target Date  03/04/18      PT LONG TERM GOAL #5   Title  Patient ambulates >1000' with LRAD & prosthesis outdoors including grass, ramps & curbs modified independent.     Time   4    Period  Months    Status  On-going    Target Date  03/04/18      PT LONG TERM GOAL #6   Title  Patient performs standing ADLs including cooking, laundry, sweeping, etc with prosthesis & LRAD modified indepedent.     Time  4    Period  Months    Status  On-going    Target Date  03/04/18        Plan - 01/14/18 0932    Clinical Impression Statement  Today's skilled session continued to focus on gait/barriers with prosthesis/straight cane with rubber quad tip. Pt continues to need cues to bend prosthetic knee with swing phase and for step length with gait. Pt did increase consecutive distance with cane today. Pt is progressing toward goals and should benefit from continued PT to progress toward unmet goals.  Rehab Potential  Good    Clinical Impairments Affecting Rehab Potential  right TFA, arthritis, CAD,A-Fib, PVD, CVA, Glaucoma, migraines, HTN, NSTEMI,  knee pain/left torn meniscus,     PT Frequency  2x / week    PT Duration  8 weeks    PT Treatment/Interventions  ADLs/Self Care Home Management;DME Instruction;Gait training;Stair training;Functional mobility training;Therapeutic activities;Therapeutic exercise;Balance training;Neuromuscular re-education;Patient/family education;Prosthetic Training    PT Next Visit Plan  gait with cane & counter/ table or hand held assist, weight shifting activities, balance activities with cane    Consulted and Agree with Plan of Care  Patient       Patient will benefit from skilled therapeutic intervention in order to improve the following deficits and impairments:  Abnormal gait, Decreased activity tolerance, Decreased balance, Decreased endurance, Decreased knowledge of use of DME, Decreased mobility, Decreased strength, Prosthetic Dependency, Postural dysfunction  Visit Diagnosis: Other abnormalities of gait and mobility  Unsteadiness on feet  Muscle weakness (generalized)     Problem List Patient Active Problem List   Diagnosis  Date Noted  . Phantom limb pain ([place])   . Hypertensive crisis   . Induration of skin   . Abnormality of gait   . Hypoalbuminemia due to protein-calorie malnutrition ([place])   . Amputee, above knee, right ([place]) 05/25/2017  . Unilateral AKA, right ([place])   . Elevated troponin   . PVD (peripheral vascular disease) ([place])   . Benign essential HTN   . Acute blood loss anemia   . Ischemia of right lower extremity 05/17/2017  . Thrombosis of right femoral artery ([place]) 01/09/2017  . Ischemia of extremity 01/09/2017  . History of colonic polyps 07/30/2016  . Essential hypertension 01/08/2016  . Polycythemia vera ([place]) 08/20/2015  . Leukocytosis 07/15/2015  . S/P arterial stent right SFA overlapping Bahn covered stents 06/27/2015 06/28/2015  . Claudication ([place]) 06/27/2015  . Carotid artery disease ([place]) 08/24/2014  . Osteoarthritis of left knee 03/27/2014  . External hemorrhoids 06/06/2013  . PAF (paroxysmal atrial fibrillation), maintaing SR 05/11/2013  . Chronic anticoagulation, new- eliquis 05/11/2013  . CAD (coronary artery disease), per cath 04/27/13, non obstructive disease 20-30% LM; LAD 30%; 50-60% OM2; RCA 40%; EF 65-70% 05/11/2013  . Mitral regurgitation,2+ by cath 05/11/2013  . Atrial fibrillation with RVR, hx of PAF previously 04/27/2013  . PAD (peripheral artery disease), Lt carotid 50-70%: , Hx stent Lt exteral iliac & RSFA stent, known occl. LSFA  04/27/2013  . Hyperlipidemia 04/27/2013  . History of CVA (cerebrovascular accident) 04/27/2013  . NSTEMI (non-ST elevated myocardial infarction) ([place]) 04/27/2013    Willow Ora, PTA, [facility] [address] [place], [place] [address] [phone] 01/14/18, 6:28 PM   Name: NOVICE VRBA MRN: [id number] Date of Birth: 08-22-1951

## 2018-01-17 ENCOUNTER — Encounter: Payer: Self-pay | Admitting: Physical Therapy

## 2018-01-17 ENCOUNTER — Ambulatory Visit: Payer: Medicare Other | Admitting: Physical Therapy

## 2018-01-17 DIAGNOSIS — R2681 Unsteadiness on feet: Secondary | ICD-10-CM

## 2018-01-17 DIAGNOSIS — R2689 Other abnormalities of gait and mobility: Secondary | ICD-10-CM

## 2018-01-17 DIAGNOSIS — M6281 Muscle weakness (generalized): Secondary | ICD-10-CM

## 2018-01-17 DIAGNOSIS — R296 Repeated falls: Secondary | ICD-10-CM

## 2018-01-17 NOTE — Therapy (Signed)
[facility] [address] [place], [place], [address] Phone: [phone]   Fax:  [phone]  Physical Therapy Treatment  Patient Details  Name: [name] MRN: [id number] Date of Birth: [birth year] Referring Provider: Jamse Arn, MD   Encounter Date: 01/17/2018  PT End of Session - 01/17/18 1528    Visit Number  16    Number of Visits  29    Date for PT Re-Evaluation  03/04/18    Authorization Type  Medicare progress note every 02IO visit recert sent visit 13    PT Start Time  0930    PT Stop Time  1014    PT Time Calculation (min)  44 min    Equipment Utilized During Treatment  Gait belt    Activity Tolerance  Patient tolerated treatment well    Behavior During Therapy  [facility] for tasks assessed/performed       Past Medical History:  Diagnosis Date  . Arthritis    "fingers, some in my knees" (01/28/2016)  . CAD (coronary artery disease), per cath 04/27/13, non obstructive disease, EF 65-70% 05/11/2013  . Carotid disease, bilateral ([place]) 09/2013   Lt carotid 50-69% stentosis, less on rt  . Dysrhythmia   . Fatty liver   . GERD (gastroesophageal reflux disease)   . Glaucoma   . H/O cardiovascular stress test 12/27/2009   negative for ischemia  . Headache    "occasional since eye pressure regulated" (01/28/2016)  . Hyperlipidemia   . Hypertension   . Leukocytosis 07/15/2015  . Migraine    "stopped w/laser holes to relieve pressure in my eyes; had them 3-4 times/wk before taht" (01/28/2016)  . Mitral regurgitation,2+ by cath 05/11/2013  . NSTEMI (non-ST elevated myocardial infarction) ([place]) 04/27/2013  . Pain    BOTH KNEES - PT HAS TORN MENISCUS LEFT KNEE  . Paroxysmal atrial fibrillation (HCC)    hx of a fib on ASA  AND ELIQUIS   . Polycythemia vera ([place]) 08/20/2015   JAK-2 positive 07/19/15  . PVD (peripheral vascular disease) with claudication ([name]) 07/2006   previous L ext iliac stent and rt SFA stent, known  occluded Lt SFA  . S/P cardiac cath 04/27/13   NON OBSTRUCTIVE DISEASE, 2+ mr, ef 65-70%  . Statin intolerance   . Stroke ([place]) 2006   SWELLING ALL OVER AND RT SIDE OF FACE DRAWN AND SPEECH SLURRED AND NUMBNESS ON RIGHT SIDE-- ALL RESOLVED    Past Surgical History:  Procedure Laterality Date  . AMPUTATION Right 05/21/2017   Procedure: AMPUTATION ABOVE KNEE;  Surgeon: Rosetta Posner, MD;  Location: [place];  Service: Vascular;  Laterality: Right;  . CARDIAC CATHETERIZATION  04/27/13   non occlusive disease with mild to mod. calcified lesions in ostial LM and proximal LAD and moderate prox. RC AND DISTAL AV GROOVE LCX, ef  . CATARACT EXTRACTION W/PHACO Right 03/21/2015   Procedure: CATARACT EXTRACTION PHACO AND INTRAOCULAR LENS PLACEMENT RIGHT EYE;  Surgeon: Baruch Goldmann, MD;  [facility];  Service: Ophthalmology;  Laterality: Right;  CDE:5.10  . CATARACT EXTRACTION W/PHACO Left 05/16/2015   Procedure: CATARACT EXTRACTION PHACO AND INTRAOCULAR LENS PLACEMENT (IOC);  Surgeon: Baruch Goldmann, MD;  Location: [facility];  Service: Ophthalmology;  Laterality: Left;  CDE 5.57  . [place]  . COLONOSCOPY N/A 09/11/2016   Procedure: COLONOSCOPY;  Surgeon: Rogene Houston, MD;  [facility];  Service: Endoscopy;  Laterality: N/A;  730-moved to 1:00 Lelon Frohlich  notified pt  . EMBOLECTOMY Right 02/01/2016   Procedure: Thrombectomy of Right Femoral-Popliteal Bypass Graft; Endarterectomy of Right Below Knee Popliteal Artery and Tibial Peroneal Trunk with Bovine Pericardium Patch Angioplasty.;  Surgeon: Angelia Mould, MD;  Location: [facility];  Service: Vascular;  Laterality: Right;  . FEMORAL-POPLITEAL BYPASS GRAFT Right 01/31/2016   Procedure: BYPASS GRAFT RIGHT COMMON  FEMORALTO BELOW KNEE POPLITEAL ARTERY BYPASS GRAFT USING 6MM PROPATEN GORTEX GRAFT;  Surgeon: Mal Misty, MD;  Location: [facility];  Service: Vascular;  Laterality: Right;  . FEMORAL-POPLITEAL BYPASS GRAFT Right 01/09/2017    Procedure: THROMBECTOMY OF RIGHT FEMORAL-POPLITEAL  ARTERY BYPASS GRAFT; THROMBECTOMY RIGHT  TIBIAL VESSELS;  Surgeon: [name], MD;  Location: [place];  Service: Vascular;  Laterality: Right;  . FEMORAL-POPLITEAL BYPASS GRAFT Right 05/17/2017   Procedure: RIGHT FEMORAL-TIBIAL PERONEAL TRUNK ARTERY BYPASS GRAFT USING 62mX80cm PROPATEN GRAFT WITH REMOVABLE RING;  Surgeon: ERosetta Posner MD;  [place]  Service: Vascular;  Laterality: Right;  . FRACTURE SURGERY    . ILIAC ARTERY STENT  07/2006   external iliac and Rt SFA stent 07/2006 AND THE LEFT WAS IN 2006  . KNEE ARTHROSCOPY WITH MEDIAL MENISECTOMY Left 03/27/2014   Procedure: left knee arthorscopy with medial chondraplasty of the medial femoral and patella, medial microfracture technique of medial femoral condyl;  Surgeon: RTobi Bastos MD;  [facility];  Service: Orthopedics;  Laterality: Left;  . LEFT HEART CATHETERIZATION WITH CORONARY ANGIOGRAM Right 04/27/2013   Procedure: LEFT HEART CATHETERIZATION WITH CORONARY ANGIOGRAM;  Surgeon: DLeonie Man MD;  [facility];  Service: Cardiovascular;  Laterality: Right;  . LOWER EXTREMITY ANGIOGRAM Right 01/31/2016   Procedure: INTRAOP RIGHT LOWER EXTREMITY ANGIOGRAM;  Surgeon: JMal Misty MD;  [place] [facility]  Service: Vascular;  Laterality: Right;  . ORIF WRIST FRACTURE Right 1983  . OVARIAN CYST REMOVAL Right   . PATCH ANGIOPLASTY Right 01/31/2016   Procedure: RIGHT COMMON FEMORAL AND PROFUNDA FEMORIS ENDARECTOMY WITH PATCH ANGIOPLASTY;  Surgeon: JMal Misty MD;  [place]  Service: Vascular;  Laterality: Right;  . PATCH ANGIOPLASTY Right 01/09/2017   Procedure: PATCH ANGIOPLASTY RIGHT POPLITEAL ARTERY BYPASS GRAFT;  Surgeon: CElam Dutch MD;  [place] [facility]  Service: Vascular;  Laterality: Right;  . PERIPHERAL VASCULAR CATHETERIZATION N/A 06/27/2015   Procedure: Lower Extremity Angiography;  Surgeon: JLorretta Harp MD;  [facility];  Service: Cardiovascular;  Laterality: N/A;  . PERIPHERAL VASCULAR CATHETERIZATION Bilateral 01/29/2016   Procedure: Lower Extremity Angiography;  Surgeon: MWellington Hampshire MD;  [facility];  Service: Cardiovascular;  Laterality: Bilateral;  . PERIPHERAL VASCULAR CATHETERIZATION N/A 01/29/2016   Procedure: Abdominal Aortogram;  Surgeon: MWellington Hampshire MD;  [facility];  Service: Cardiovascular;  Laterality: N/A;  . REFRACTIVE SURGERY Bilateral    "6 in one eye; 7 in the other; to relieve pressure; not glaucoma" (01/28/2016)  . SFA Right 06/27/2015   overlapping Bahn covered stents  . TUBAL LIGATION  1983  . VEIN HARVEST Left 05/17/2017   Procedure: LEFT LEG GREATER SAPHENOUS VEIN HARVEST;  Surgeon: ERosetta Posner MD;  [place] [facility]  Service: Vascular;  Laterality: Left;  .Marland KitchenVEIN REPAIR Right 01/31/2016   Procedure: RIGHT GREATER SAPHENOUS VEIN EXAMNED BUT NOT REMOVED;  Surgeon: JMal Misty MD;  [place] [facility]  Service: Vascular;  Laterality: Right;    There were no vitals filed for this  visit.  Subjective Assessment - 01/17/18 0930    Subjective  No falls. She almost fell one time but caught herself. with crutches. She has been emotional.     Pertinent History  right TFA, arthritis, CAD,A-Fib, PVD, CVA, Glaucoma, migraines, HTN, NSTEMI,  knee pain/left torn meniscus,     Limitations  Lifting;Standing;Walking    Patient Stated Goals  To use prosthesis walking. Previously 2-4 miles.     Currently in Pain?  No/denies                      [facility] Adult PT Treatment/Exercise - 01/17/18 0930      Transfers   Transfers  Sit to Stand;Stand to Sit    Sit to Stand  5: Supervision;With upper extremity assist;With armrests;From chair/3-in-1 to cane    Sit to Stand Details  Verbal cues for safe use of DME/AE;Verbal cues for sequencing    Stand to Sit  5: Supervision;With upper extremity assist;With armrests;To chair/3-in-1 from cane    Stand  to Sit Details (indicate cue type and reason)  Verbal cues for sequencing;Verbal cues for safe use of DME/AE      Ambulation/Gait   Ambulation/Gait  Yes    Ambulation/Gait Assistance  4: Min assist    Ambulation/Gait Assistance Details  Manual, verbal & tactile cues on posture, prosthetic knee control, proper advancement & wt shift.     Ambulation Distance (Feet)  130 Feet 130' X 3    Assistive device  Prosthesis;Straight cane;1 person hand held assist with rubber quad tip, progressed hand hold to sliding RUEwal    Gait Pattern  Step-through pattern;Decreased step length - left;Decreased stance time - right;Decreased stride length    Ambulation Surface  Indoor;Level    Stairs  Yes    Stairs Assistance  5: Supervision    Stair Management Technique  One rail Right;One rail Left;With cane;Step to pattern;Forwards    Number of Stairs  4 x2 reps    Ramp  4: Min assist cane & prosthesis    Ramp Details (indicate cue type and reason)  demo & verbal cues on weight shift over prosthesis in stance & upright posture    Curb  4: Min assist cane & prosthesis    Curb Details (indicate cue type and reason)  demo & verbal cues on foot placement, wt shift & balance reaction      High Level Balance   High Level Balance Activities  Side stepping;Backward walking;Negotiating over obstacles    High Level Balance Comments  side stepping & backwards with cane near counter with verbal cues on weight shift & balance reactions.  PT demo, instructed in technique stepping over obstacles with TFA prosthesis. Pt return demo understanding with minA /handhold with cane.       Prosthetics   Current prosthetic wear tolerance (days/week)   daily    Current prosthetic wear tolerance (#hours/day)   12 hrs straight. working toward all awake hours     Residual limb condition   intact per pt    Education Provided  Proper wear schedule/adjustment;Proper weight-bearing schedule/adjustment;Residual limb care;Correct ply sock  adjustment               PT Short Term Goals - 01/12/18 1323      PT SHORT TERM GOAL #1   Title  Patient verbalizes & demonstrates proper donning with correct rotation.  (All STGs Target Date: 02/03/2018)    Time  4    Period  Weeks  Status  On-going    Target Date  02/03/18      PT SHORT TERM GOAL #2   Title  Patient able to reach 5" & to floor with cane support with supervision.     Time  4    Period  Weeks    Status  New      PT SHORT TERM GOAL #3   Title  Patient demonstrates standing ADLs like cooking in kitchen or laundry with prosthesis with supervision.     Time  4    Period  Weeks    Status  New      PT SHORT TERM GOAL #4   Title  Patient ambulates 200' with cane & prosthesis with supervision.     Time  4    Period  Weeks    Status  New      PT SHORT TERM GOAL #5   Title  Patient negotiates stairs (1 rails), ramps & curbs with cane & prosthesis with minimal assist    Time  4    Period  Weeks    Status  New        PT Long Term Goals - 01/05/18 1135      PT LONG TERM GOAL #1   Title  Patient verbalizes & demonstrates proper prosthetic care to enable safe use of prosthesis. (All LTGs Target Date: 03/04/2018)    Time  4    Period  Months    Status  On-going    Target Date  03/04/18      PT LONG TERM GOAL #2   Title  Patient tolerates wear of prosthesis >90% of awake hours without skin issues or limb pain to enable function during her day.     Time  4    Period  Months    Status  On-going    Target Date  03/04/18      PT LONG TERM GOAL #3   Title  Patient ambulates household around furniture carrying items with cane or less & prosthesis modified independent.     Time  4    Period  Months    Status  On-going    Target Date  03/04/18      PT LONG TERM GOAL #4   Title  Berg Balance >/= 45/56 to indicate lower fall risk.     Time  4    Period  Months    Status  On-going    Target Date  03/04/18      PT LONG TERM GOAL #5   Title  Patient  ambulates >1000' with LRAD & prosthesis outdoors including grass, ramps & curbs modified independent.     Time  4    Period  Months    Status  On-going    Target Date  03/04/18      PT LONG TERM GOAL #6   Title  Patient performs standing ADLs including cooking, laundry, sweeping, etc with prosthesis & LRAD modified indepedent.     Time  4    Period  Months    Status  On-going    Target Date  03/04/18            Plan - 01/17/18 2329    Clinical Impression Statement  Today's skilled session focused on gait with cane support including negotiating barriers. Patient requires skilled instruction in weight shift over prosthesis in stance especially on ramp & curb. Patient initially was anxious with new task of stepping over obstacles  but improved with instruction & repetition.     Rehab Potential  Good    Clinical Impairments Affecting Rehab Potential  right TFA, arthritis, CAD,A-Fib, PVD, CVA, Glaucoma, migraines, HTN, NSTEMI,  knee pain/left torn meniscus,     PT Frequency  2x / week    PT Duration  8 weeks    PT Treatment/Interventions  ADLs/Self Care Home Management;DME Instruction;Gait training;Stair training;Functional mobility training;Therapeutic activities;Therapeutic exercise;Balance training;Neuromuscular re-education;Patient/family education;Prosthetic Training    PT Next Visit Plan  gait with cane & counter/ table or hand held assist, weight shifting activities, balance activities with cane    Consulted and Agree with Plan of Care  Patient       Patient will benefit from skilled therapeutic intervention in order to improve the following deficits and impairments:  Abnormal gait, Decreased activity tolerance, Decreased balance, Decreased endurance, Decreased knowledge of use of DME, Decreased mobility, Decreased strength, Prosthetic Dependency, Postural dysfunction  Visit Diagnosis: Other abnormalities of gait and mobility  Unsteadiness on feet  Muscle weakness  (generalized)  Repeated falls     Problem List Patient Active Problem List   Diagnosis Date Noted  . Phantom limb pain ([place])   . Hypertensive crisis   . Induration of skin   . Abnormality of gait   . Hypoalbuminemia due to protein-calorie malnutrition ([place])   . Amputee, above knee, right ([place]) 05/25/2017  . Unilateral AKA, right ([name])   . Elevated troponin   . PVD (peripheral vascular disease) ([place])   . Benign essential HTN   . Acute blood loss anemia   . Ischemia of right lower extremity 05/17/2017  . Thrombosis of right femoral artery ([place]) 01/09/2017  . Ischemia of extremity 01/09/2017  . History of colonic polyps 07/30/2016  . Essential hypertension 01/08/2016  . Polycythemia vera ([place]) 08/20/2015  . Leukocytosis 07/15/2015  . S/P arterial stent right SFA overlapping Bahn covered stents 06/27/2015 06/28/2015  . Claudication ([place]) 06/27/2015  . Carotid artery disease ([place]) 08/24/2014  . Osteoarthritis of left knee 03/27/2014  . External hemorrhoids 06/06/2013  . PAF (paroxysmal atrial fibrillation), maintaing SR 05/11/2013  . Chronic anticoagulation, new- eliquis 05/11/2013  . CAD (coronary artery disease), per cath 04/27/13, non obstructive disease 20-30% LM; LAD 30%; 50-60% OM2; RCA 40%; EF 65-70% 05/11/2013  . Mitral regurgitation,2+ by cath 05/11/2013  . Atrial fibrillation with RVR, hx of PAF previously 04/27/2013  . PAD (peripheral artery disease), Lt carotid 50-70%: , Hx stent Lt exteral iliac & RSFA stent, known occl. LSFA  04/27/2013  . Hyperlipidemia 04/27/2013  . History of CVA (cerebrovascular accident) 04/27/2013  . NSTEMI (non-ST elevated myocardial infarction) ([place]) 04/27/2013    Carthel Castille PT, DPT 01/17/2018, 11:32 PM  [facility] [address] [place], [place], [address] Phone: [phone]   Fax:  [phone]  Name: [name] MRN: [id number] Date of Birth: Apr 02, 1951

## 2018-01-19 ENCOUNTER — Encounter: Payer: Self-pay | Admitting: Physical Therapy

## 2018-01-19 ENCOUNTER — Ambulatory Visit: Payer: Medicare Other | Admitting: Physical Therapy

## 2018-01-19 DIAGNOSIS — R2689 Other abnormalities of gait and mobility: Secondary | ICD-10-CM

## 2018-01-19 DIAGNOSIS — R296 Repeated falls: Secondary | ICD-10-CM

## 2018-01-19 DIAGNOSIS — R2681 Unsteadiness on feet: Secondary | ICD-10-CM

## 2018-01-19 DIAGNOSIS — M6281 Muscle weakness (generalized): Secondary | ICD-10-CM

## 2018-01-19 NOTE — Therapy (Signed)
[facility] [address] [place], [place], [address] Phone: [phone]   Fax:  [phone]  Physical Therapy Treatment  Patient Details  Name: [name] MRN: [id number] Date of Birth: December 10, 1951 Referring Provider: Jamse Arn, MD   Encounter Date: 01/19/2018  PT End of Session - 01/19/18 0930    Visit Number  17    Number of Visits  29    Date for PT Re-Evaluation  03/04/18    Authorization Type  Medicare progress note every 85ID visit recert sent visit 13    PT Start Time  0930    PT Stop Time  1020    PT Time Calculation (min)  50 min    Equipment Utilized During Treatment  Gait belt    Activity Tolerance  Patient tolerated treatment well    Behavior During Therapy  [facility] for tasks assessed/performed       Past Medical History:  Diagnosis Date  . Arthritis    "fingers, some in my knees" (01/28/2016)  . CAD (coronary artery disease), per cath 04/27/13, non obstructive disease, EF 65-70% 05/11/2013  . Carotid disease, bilateral ([place]) 09/2013   Lt carotid 50-69% stentosis, less on rt  . Dysrhythmia   . Fatty liver   . GERD (gastroesophageal reflux disease)   . Glaucoma   . H/O cardiovascular stress test 12/27/2009   negative for ischemia  . Headache    "occasional since eye pressure regulated" (01/28/2016)  . Hyperlipidemia   . Hypertension   . Leukocytosis 07/15/2015  . Migraine    "stopped w/laser holes to relieve pressure in my eyes; had them 3-4 times/wk before taht" (01/28/2016)  . Mitral regurgitation,2+ by cath 05/11/2013  . NSTEMI (non-ST elevated myocardial infarction) ([place]) 04/27/2013  . Pain    BOTH KNEES - PT HAS TORN MENISCUS LEFT KNEE  . Paroxysmal atrial fibrillation (HCC)    hx of a fib on ASA  AND ELIQUIS   . Polycythemia vera ([place]) 08/20/2015   JAK-2 positive 07/19/15  . PVD (peripheral vascular disease) with claudication ([place]) 07/2006   previous L ext iliac stent and rt SFA stent, known  occluded Lt SFA  . S/P cardiac cath 04/27/13   NON OBSTRUCTIVE DISEASE, 2+ mr, ef 65-70%  . Statin intolerance   . Stroke ([place]) 2006   SWELLING ALL OVER AND RT SIDE OF FACE DRAWN AND SPEECH SLURRED AND NUMBNESS ON RIGHT SIDE-- ALL RESOLVED    Past Surgical History:  Procedure Laterality Date  . AMPUTATION Right 05/21/2017   Procedure: AMPUTATION ABOVE KNEE;  Surgeon: Rosetta Posner, MD;  Location: [facility];  Service: Vascular;  Laterality: Right;  . CARDIAC CATHETERIZATION  04/27/13   non occlusive disease with mild to mod. calcified lesions in ostial LM and proximal LAD and moderate prox. RC AND DISTAL AV GROOVE LCX, ef  . CATARACT EXTRACTION W/PHACO Right 03/21/2015   Procedure: CATARACT EXTRACTION PHACO AND INTRAOCULAR LENS PLACEMENT RIGHT EYE;  Surgeon: Baruch Goldmann, MD;  Location: [facility];  Service: Ophthalmology;  Laterality: Right;  CDE:5.10  . CATARACT EXTRACTION W/PHACO Left 05/16/2015   Procedure: CATARACT EXTRACTION PHACO AND INTRAOCULAR LENS PLACEMENT (IOC);  Surgeon: Baruch Goldmann, MD;  Location: [facility];  Service: Ophthalmology;  Laterality: Left;  CDE 5.57  . [place]  . COLONOSCOPY N/A 09/11/2016   Procedure: COLONOSCOPY;  Surgeon: Rogene Houston, MD;  [facility];  Service: Endoscopy;  Laterality: N/A;  730-moved to 1:00 Lelon Frohlich  notified pt  . EMBOLECTOMY Right 02/01/2016   Procedure: Thrombectomy of Right Femoral-Popliteal Bypass Graft; Endarterectomy of Right Below Knee Popliteal Artery and Tibial Peroneal Trunk with Bovine Pericardium Patch Angioplasty.;  Surgeon: Angelia Mould, MD;  Location: [facility];  Service: Vascular;  Laterality: Right;  . FEMORAL-POPLITEAL BYPASS GRAFT Right 01/31/2016   Procedure: BYPASS GRAFT RIGHT COMMON  FEMORALTO BELOW KNEE POPLITEAL ARTERY BYPASS GRAFT USING 6MM PROPATEN GORTEX GRAFT;  Surgeon: Mal Misty, MD;  Location: [facility];  Service: Vascular;  Laterality: Right;  . FEMORAL-POPLITEAL BYPASS GRAFT Right 01/09/2017    Procedure: THROMBECTOMY OF RIGHT FEMORAL-POPLITEAL  ARTERY BYPASS GRAFT; THROMBECTOMY RIGHT  TIBIAL VESSELS;  Surgeon: [name], MD;  Location: [facility];  Service: Vascular;  Laterality: Right;  . FEMORAL-POPLITEAL BYPASS GRAFT Right 05/17/2017   Procedure: RIGHT FEMORAL-TIBIAL PERONEAL TRUNK ARTERY BYPASS GRAFT USING 62mX80cm PROPATEN GRAFT WITH REMOVABLE RING;  Surgeon: ERosetta Posner MD;  [place]  Service: Vascular;  Laterality: Right;  . FRACTURE SURGERY    . ILIAC ARTERY STENT  07/2006   external iliac and Rt SFA stent 07/2006 AND THE LEFT WAS IN 2006  . KNEE ARTHROSCOPY WITH MEDIAL MENISECTOMY Left 03/27/2014   Procedure: left knee arthorscopy with medial chondraplasty of the medial femoral and patella, medial microfracture technique of medial femoral condyl;  Surgeon: RTobi Bastos MD;  [facility]: [facility];  Service: Orthopedics;  Laterality: Left;  . LEFT HEART CATHETERIZATION WITH CORONARY ANGIOGRAM Right 04/27/2013   Procedure: LEFT HEART CATHETERIZATION WITH CORONARY ANGIOGRAM;  Surgeon: DLeonie Man MD;  [facility];  Service: Cardiovascular;  Laterality: Right;  . LOWER EXTREMITY ANGIOGRAM Right 01/31/2016   Procedure: INTRAOP RIGHT LOWER EXTREMITY ANGIOGRAM;  Surgeon: JMal Misty MD;  [facility]  Service: Vascular;  Laterality: Right;  . ORIF WRIST FRACTURE Right 1983  . OVARIAN CYST REMOVAL Right   . PATCH ANGIOPLASTY Right 01/31/2016   Procedure: RIGHT COMMON FEMORAL AND PROFUNDA FEMORIS ENDARECTOMY WITH PATCH ANGIOPLASTY;  Surgeon: JMal Misty MD;  [place] [facility]  Service: Vascular;  Laterality: Right;  . PATCH ANGIOPLASTY Right 01/09/2017   Procedure: PATCH ANGIOPLASTY RIGHT POPLITEAL ARTERY BYPASS GRAFT;  Surgeon: CElam Dutch MD;  [facility]  Service: Vascular;  Laterality: Right;  . PERIPHERAL VASCULAR CATHETERIZATION N/A 06/27/2015   Procedure: Lower Extremity Angiography;  Surgeon: JLorretta Harp MD;  [facility];  Service: Cardiovascular;  Laterality: N/A;  . PERIPHERAL VASCULAR CATHETERIZATION Bilateral 01/29/2016   Procedure: Lower Extremity Angiography;  Surgeon: MWellington Hampshire MD;  [facility];  Service: Cardiovascular;  Laterality: Bilateral;  . PERIPHERAL VASCULAR CATHETERIZATION N/A 01/29/2016   Procedure: Abdominal Aortogram;  Surgeon: MWellington Hampshire MD;  [facility];  Service: Cardiovascular;  Laterality: N/A;  . REFRACTIVE SURGERY Bilateral    "6 in one eye; 7 in the other; to relieve pressure; not glaucoma" (01/28/2016)  . SFA Right 06/27/2015   overlapping Bahn covered stents  . TUBAL LIGATION  1983  . VEIN HARVEST Left 05/17/2017   Procedure: LEFT LEG GREATER SAPHENOUS VEIN HARVEST;  Surgeon: ERosetta Posner MD;  [facility]  Service: Vascular;  Laterality: Left;  .Marland KitchenVEIN REPAIR Right 01/31/2016   Procedure: RIGHT GREATER SAPHENOUS VEIN EXAMNED BUT NOT REMOVED;  Surgeon: JMal Misty MD;  [place]  Service: Vascular;  Laterality: Right;    There were no vitals filed for this  visit.  Subjective Assessment - 01/19/18 0930    Subjective  No falls. She reports she feels like her right hip goes out too far when she weights it.     Patient Stated Goals  To use prosthesis walking. Previously 2-4 miles.     Currently in Pain?  No/denies                      [facility] Adult PT Treatment/Exercise - 01/19/18 0930      Transfers   Transfers  Sit to Stand;Stand to Sit;Floor to Transfer    Sit to Stand  6: Modified independent (Device/Increase time);With armrests    Sit to Stand Details  --    Stand to Sit  5: Supervision;With armrests    Stand to Sit Details (indicate cue type and reason)  Verbal cues for technique;Verbal cues for sequencing;Verbal cues for safe use of DME/AE    Stand to Sit Details  verbal cueing to place AD on same side of Prosthetic     Floor to Transfer  4: Min guard    Floor to Transfer Details (indicate cue  type and reason)  PT demo, pt require minimal tactile cueing and moderate verbal cueing for sequencing     Number of Reps  Other reps (comment);2 sets 1 rep    Transfer Cueing  Sequencing, UE and LE placement, weight shift       Ambulation/Gait   Ambulation/Gait  Yes    Ambulation/Gait Assistance  4: Min assist    Ambulation/Gait Assistance Details  Pt require HHA when amb with cane    Ambulation Distance (Feet)  200 Feet longest 115' with cane and w/o breaks,     Assistive device  Small based quad cane    Gait Pattern  Step-through pattern;Narrow base of support;Decreased stride length;Decreased weight shift to right pt instructed to maintain 2-4" stride width    Ambulation Surface  Level;Indoor    Stairs  No      High Level Balance   High Level Balance Activities  Negotiating over obstacles  2-5" tall obstacles     High Level Balance Comments  w/ Small based care, step over 3 obsticals 2-5" difficulty with 6" tall 6" wide, verbal cue for sequence,              PT Education - 01/19/18 1139    Education provided  Yes    Education Details  Garden knee bench use, safety, and acquisition.     Person(s) Educated  Patient    Methods  Explanation;Demonstration    Comprehension  Verbalized understanding       PT Short Term Goals - 01/12/18 1323      PT SHORT TERM GOAL #1   Title  Patient verbalizes & demonstrates proper donning with correct rotation.  (All STGs Target Date: 02/03/2018)    Time  4    Period  Weeks    Status  On-going    Target Date  02/03/18      PT SHORT TERM GOAL #2   Title  Patient able to reach 5" & to floor with cane support with supervision.     Time  4    Period  Weeks    Status  New      PT SHORT TERM GOAL #3   Title  Patient demonstrates standing ADLs like cooking in kitchen or laundry with prosthesis with supervision.     Time  4    Period  Weeks    Status  New      PT SHORT TERM GOAL #4   Title  Patient ambulates 200' with cane & prosthesis  with supervision.     Time  4    Period  Weeks    Status  New      PT SHORT TERM GOAL #5   Title  Patient negotiates stairs (1 rails), ramps & curbs with cane & prosthesis with minimal assist    Time  4    Period  Weeks    Status  New        PT Long Term Goals - 01/05/18 1135      PT LONG TERM GOAL #1   Title  Patient verbalizes & demonstrates proper prosthetic care to enable safe use of prosthesis. (All LTGs Target Date: 03/04/2018)    Time  4    Period  Months    Status  On-going    Target Date  03/04/18      PT LONG TERM GOAL #2   Title  Patient tolerates wear of prosthesis >90% of awake hours without skin issues or limb pain to enable function during her day.     Time  4    Period  Months    Status  On-going    Target Date  03/04/18      PT LONG TERM GOAL #3   Title  Patient ambulates household around furniture carrying items with cane or less & prosthesis modified independent.     Time  4    Period  Months    Status  On-going    Target Date  03/04/18      PT LONG TERM GOAL #4   Title  Berg Balance >/= 45/56 to indicate lower fall risk.     Time  4    Period  Months    Status  On-going    Target Date  03/04/18      PT LONG TERM GOAL #5   Title  Patient ambulates >1000' with LRAD & prosthesis outdoors including grass, ramps & curbs modified independent.     Time  4    Period  Months    Status  On-going    Target Date  03/04/18      PT LONG TERM GOAL #6   Title  Patient performs standing ADLs including cooking, laundry, sweeping, etc with prosthesis & LRAD modified indepedent.     Time  4    Period  Months    Status  On-going    Target Date  03/04/18            Plan - 01/19/18 1147    Clinical Impression Statement  Pt continues to progress with ambulation skills, use of small base cane. Pt has demonstrated ability to self-identify errors in her movement patters and correct them with minimal verbal cueing. Pt continues to be hesitant with attempting  new/challenging ambulation skills (step over obstacles) but continues to gain confidence as task progresses.    PT Next Visit Plan  pt to continue to work on gait with cane ( obstical navigation, distance, correct weight shifting), as well as balance training with cane to progress towards meeting her long term goals.        Patient will benefit from skilled therapeutic intervention in order to improve the following deficits and impairments:     Visit Diagnosis: Other abnormalities of gait and mobility  Muscle weakness (generalized)  Repeated falls  Unsteadiness  on feet     Problem List Patient Active Problem List   Diagnosis Date Noted  . Phantom limb pain ([place])   . Hypertensive crisis   . Induration of skin   . Abnormality of gait   . Hypoalbuminemia due to protein-calorie malnutrition ([place])   . Amputee, above knee, right ([place]) 05/25/2017  . Unilateral AKA, right ([place])   . Elevated troponin   . PVD (peripheral vascular disease) ([place])   . Benign essential HTN   . Acute blood loss anemia   . Ischemia of right lower extremity 05/17/2017  . Thrombosis of right femoral artery ([place]) 01/09/2017  . Ischemia of extremity 01/09/2017  . History of colonic polyps 07/30/2016  . Essential hypertension 01/08/2016  . Polycythemia vera ([place]) 08/20/2015  . Leukocytosis 07/15/2015  . S/P arterial stent right SFA overlapping Bahn covered stents 06/27/2015 06/28/2015  . Claudication ([place]) 06/27/2015  . Carotid artery disease ([place]) 08/24/2014  . Osteoarthritis of left knee 03/27/2014  . External hemorrhoids 06/06/2013  . PAF (paroxysmal atrial fibrillation), maintaing SR 05/11/2013  . Chronic anticoagulation, new- eliquis 05/11/2013  . CAD (coronary artery disease), per cath 04/27/13, non obstructive disease 20-30% LM; LAD 30%; 50-60% OM2; RCA 40%; EF 65-70% 05/11/2013  . Mitral regurgitation,2+ by cath 05/11/2013  . Atrial fibrillation with RVR, hx of PAF previously 04/27/2013  . PAD  (peripheral artery disease), Lt carotid 50-70%: , Hx stent Lt exteral iliac & RSFA stent, known occl. LSFA  04/27/2013  . Hyperlipidemia 04/27/2013  . History of CVA (cerebrovascular accident) 04/27/2013  . NSTEMI (non-ST elevated myocardial infarction) ([place]) 04/27/2013    Waunita Schooner  SPT 01/19/2018, 4:41 PM  [facility] [address] [place], [place], [address] Phone: [phone]   Fax:  [phone]  Name: MELESSIA KAUS MRN: [id number] Date of Birth: January 26, 1951

## 2018-01-24 ENCOUNTER — Encounter: Payer: Self-pay | Admitting: Physical Therapy

## 2018-01-24 ENCOUNTER — Ambulatory Visit: Payer: Medicare Other | Attending: Physical Medicine & Rehabilitation | Admitting: Physical Therapy

## 2018-01-24 VITALS — BP 173/70 | HR 70

## 2018-01-24 DIAGNOSIS — R2689 Other abnormalities of gait and mobility: Secondary | ICD-10-CM

## 2018-01-24 DIAGNOSIS — R2681 Unsteadiness on feet: Secondary | ICD-10-CM | POA: Diagnosis present

## 2018-01-24 DIAGNOSIS — R296 Repeated falls: Secondary | ICD-10-CM | POA: Diagnosis present

## 2018-01-24 DIAGNOSIS — M6281 Muscle weakness (generalized): Secondary | ICD-10-CM | POA: Insufficient documentation

## 2018-01-24 NOTE — Therapy (Signed)
[facility] [address] [place], [place], [address] Phone: [phone]   Fax:  [phone]  Physical Therapy Treatment  Patient Details  Name: [name] MRN: [id number] Date of Birth: 1951/03/11 Referring Provider: Jamse Arn, MD   Encounter Date: 01/24/2018  PT End of Session - 01/24/18 0945    Visit Number  18    Number of Visits  29    Date for PT Re-Evaluation  03/04/18    Authorization Type  Medicare progress note every 41YS visit recert sent visit 13    PT Start Time  0935    PT Stop Time  1015    PT Time Calculation (min)  40 min    Equipment Utilized During Treatment  Gait belt    Activity Tolerance  Patient tolerated treatment well    Behavior During Therapy  [facility] for tasks assessed/performed       Past Medical History:  Diagnosis Date  . Arthritis    "fingers, some in my knees" (01/28/2016)  . CAD (coronary artery disease), per cath 04/27/13, non obstructive disease, EF 65-70% 05/11/2013  . Carotid disease, bilateral ([place]) 09/2013   Lt carotid 50-69% stentosis, less on rt  . Dysrhythmia   . Fatty liver   . GERD (gastroesophageal reflux disease)   . Glaucoma   . H/O cardiovascular stress test 12/27/2009   negative for ischemia  . Headache    "occasional since eye pressure regulated" (01/28/2016)  . Hyperlipidemia   . Hypertension   . Leukocytosis 07/15/2015  . Migraine    "stopped w/laser holes to relieve pressure in my eyes; had them 3-4 times/wk before taht" (01/28/2016)  . Mitral regurgitation,2+ by cath 05/11/2013  . NSTEMI (non-ST elevated myocardial infarction) ([place]) 04/27/2013  . Pain    BOTH KNEES - PT HAS TORN MENISCUS LEFT KNEE  . Paroxysmal atrial fibrillation (HCC)    hx of a fib on ASA  AND ELIQUIS   . Polycythemia vera ([place]) 08/20/2015   JAK-2 positive 07/19/15  . PVD (peripheral vascular disease) with claudication ([place]) 07/2006   previous L ext iliac stent and rt SFA stent, known  occluded Lt SFA  . S/P cardiac cath 04/27/13   NON OBSTRUCTIVE DISEASE, 2+ mr, ef 65-70%  . Statin intolerance   . Stroke ([place]) 2006   SWELLING ALL OVER AND RT SIDE OF FACE DRAWN AND SPEECH SLURRED AND NUMBNESS ON RIGHT SIDE-- ALL RESOLVED    Past Surgical History:  Procedure Laterality Date  . AMPUTATION Right 05/21/2017   Procedure: AMPUTATION ABOVE KNEE;  Surgeon: Rosetta Posner, MD;  Location: [facility];  Service: Vascular;  Laterality: Right;  . CARDIAC CATHETERIZATION  04/27/13   non occlusive disease with mild to mod. calcified lesions in ostial LM and proximal LAD and moderate prox. RC AND DISTAL AV GROOVE LCX, ef  . CATARACT EXTRACTION W/PHACO Right 03/21/2015   Procedure: CATARACT EXTRACTION PHACO AND INTRAOCULAR LENS PLACEMENT RIGHT EYE;  Surgeon: Baruch Goldmann, MD;  [facility]: [facility];  Service: Ophthalmology;  Laterality: Right;  CDE:5.10  . CATARACT EXTRACTION W/PHACO Left 05/16/2015   Procedure: CATARACT EXTRACTION PHACO AND INTRAOCULAR LENS PLACEMENT (IOC);  Surgeon: Baruch Goldmann, MD;  Location: [facility];  Service: Ophthalmology;  Laterality: Left;  CDE 5.57  . [place]  . COLONOSCOPY N/A 09/11/2016   Procedure: COLONOSCOPY;  Surgeon: Rogene Houston, MD;  [facility];  Service: Endoscopy;  Laterality: N/A;  730-moved to 1:00 Lelon Frohlich  notified pt  . EMBOLECTOMY Right 02/01/2016   Procedure: Thrombectomy of Right Femoral-Popliteal Bypass Graft; Endarterectomy of Right Below Knee Popliteal Artery and Tibial Peroneal Trunk with Bovine Pericardium Patch Angioplasty.;  Surgeon: Angelia Mould, MD;  Location: [facility];  Service: Vascular;  Laterality: Right;  . FEMORAL-POPLITEAL BYPASS GRAFT Right 01/31/2016   Procedure: BYPASS GRAFT RIGHT COMMON  FEMORALTO BELOW KNEE POPLITEAL ARTERY BYPASS GRAFT USING 6MM PROPATEN GORTEX GRAFT;  Surgeon: Mal Misty, MD;  Location: [facility];  Service: Vascular;  Laterality: Right;  . FEMORAL-POPLITEAL BYPASS GRAFT Right 01/09/2017    Procedure: THROMBECTOMY OF RIGHT FEMORAL-POPLITEAL  ARTERY BYPASS GRAFT; THROMBECTOMY RIGHT  TIBIAL VESSELS;  Surgeon: [name], MD;  Location: [facility];  Service: Vascular;  Laterality: Right;  . FEMORAL-POPLITEAL BYPASS GRAFT Right 05/17/2017   Procedure: RIGHT FEMORAL-TIBIAL PERONEAL TRUNK ARTERY BYPASS GRAFT USING 37mmX80cm PROPATEN GRAFT WITH REMOVABLE RING;  Surgeon: Rosetta Posner, MD;  Location: [facility];  Service: Vascular;  Laterality: Right;  . FRACTURE SURGERY    . ILIAC ARTERY STENT  07/2006   external iliac and Rt SFA stent 07/2006 AND THE LEFT WAS IN 2006  . KNEE ARTHROSCOPY WITH MEDIAL MENISECTOMY Left 03/27/2014   Procedure: left knee arthorscopy with medial chondraplasty of the medial femoral and patella, medial microfracture technique of medial femoral condyl;  Surgeon: Tobi Bastos, MD;  Location: [facility];  Service: Orthopedics;  Laterality: Left;  . LEFT HEART CATHETERIZATION WITH CORONARY ANGIOGRAM Right 04/27/2013   Procedure: LEFT HEART CATHETERIZATION WITH CORONARY ANGIOGRAM;  Surgeon: Leonie Man, MD;  Location: [facility] C[facility];  Service: Cardiovascular;  Laterality: Right;  . LOWER EXTREMITY ANGIOGRAM Right 01/31/2016   Procedure: INTRAOP RIGHT LOWER EXTREMITY ANGIOGRAM;  Surgeon: Mal Misty, MD;  Location: [facility];  Service: Vascular;  Laterality: Right;  . ORIF WRIST FRACTURE Right 1983  . OVARIAN CYST REMOVAL Right   . PATCH ANGIOPLASTY Right 01/31/2016   Procedure: RIGHT COMMON FEMORAL AND PROFUNDA FEMORIS ENDARECTOMY WITH PATCH ANGIOPLASTY;  Surgeon: Mal Misty, MD;  Location: [facility];  Service: Vascular;  Laterality: Right;  . PATCH ANGIOPLASTY Right 01/09/2017   Procedure: PATCH ANGIOPLASTY RIGHT POPLITEAL ARTERY BYPASS GRAFT;  Surgeon: [name], MD;  Location: [facility];  Service: Vascular;  Laterality: Right;  . PERIPHERAL VASCULAR CATHETERIZATION N/A 06/27/2015   Procedure: Lower Extremity Angiography;  Surgeon: Lorretta Harp, MD;  Location: [facility] CV  LAB;  Service: Cardiovascular;  Laterality: N/A;  . PERIPHERAL VASCULAR CATHETERIZATION Bilateral 01/29/2016   Procedure: Lower Extremity Angiography;  Surgeon: Wellington Hampshire, MD;  Location: [facility] CV LAB;  Service: Cardiovascular;  Laterality: Bilateral;  . PERIPHERAL VASCULAR CATHETERIZATION N/A 01/29/2016   Procedure: Abdominal Aortogram;  Surgeon: Wellington Hampshire, MD;  Location: [place] [facility] [place] [facility];  Service: Cardiovascular;  Laterality: N/A;  . REFRACTIVE SURGERY Bilateral    "6 in one eye; 7 in the other; to relieve pressure; not glaucoma" (01/28/2016)  . SFA Right 06/27/2015   overlapping Bahn covered stents  . TUBAL LIGATION  1983  . VEIN HARVEST Left 05/17/2017   Procedure: LEFT LEG GREATER SAPHENOUS VEIN HARVEST;  Surgeon: Rosetta Posner, MD;  Location: [facility];  Service: Vascular;  Laterality: Left;  Marland Kitchen VEIN REPAIR Right 01/31/2016   Procedure: RIGHT GREATER SAPHENOUS VEIN EXAMNED BUT NOT REMOVED;  Surgeon: Mal Misty, MD;  Location: [facility];  Service: Vascular;  Laterality: Right;    Vitals:   01/24/18 0948  BP: [place] Kitchen)  173/70  Pulse: 70    Subjective Assessment - 01/24/18 0942    Subjective  pt reports no fall since last pt session. Pt reports mild dizziness, that resolved with light activity. Pt reports havieng had only chips for breakfast.     Patient Stated Goals  To use prosthesis walking. Previously 2-4 miles.     Currently in Pain?  No/denies                      [facility] Adult PT Treatment/Exercise - 01/24/18 0950      Transfers   Transfers  Sit to Stand;Stand to Sit    Sit to Stand  6: Modified independent (Device/Increase time);With upper extremity assist;With armrests;From chair/3-in-1 to cane for stabilization    Stand to Sit  5: Supervision;With armrests;With upper extremity assist;To chair/3-in-1      Ambulation/Gait   Ambulation/Gait  Yes    Ambulation/Gait Assistance  4: Min assist    Ambulation/Gait Assistance Details  verbal cues for hip trunk  alinement navigating around objects    Ambulation Distance (Feet)  184 Feet    Assistive device  Straight cane;1 person hand held assist quad tip    Gait Pattern  Step-through pattern;Narrow base of support;Decreased stride length;Decreased weight shift to right    Ambulation Surface  Level;Unlevel;Indoor;Outdoor;Grass;Gravel    Door Management  5: Supervision Min. guard     Door Managment Details (indicate cue type and reason)  2 cylinder resistance doors  2x ea.     Ramp  4: Min assist cane quad tip & prosthesis    Ramp Details (indicate cue type and reason)  minimal verbal cues on Sequencing & upright posture    Curb  4: Min assist cane quad tip & prosthesis    Curb Details (indicate cue type and reason)  verbal cues on sequence & balance reaction      High Level Balance   High Level Balance Activities  Figure 8 turns;Negotitating around obstacles cane quad tip & prosthesis    High Level Balance Comments  PT demo, instructed in alterating step length & using pelvis to orient in direction of turn.       Self-Care   ADL's  carrying plate, then cup with water around 2 hoops figure 8 pattern  using cane.  verbal cues to keep hips in line with trunk during truning. Supervision/min guard.     Lifting  pick up cane from ground Supervision/Min. Guard  PT cued on width of step to enable to go lower as needed.  PT demo, 3 reps. Verbal cueing.       Prosthetics   Current prosthetic wear tolerance (days/week)   daily    Current prosthetic wear tolerance (#hours/day)   she reports most of awake hours    Residual limb condition   intact per pt report             PT Education - 01/24/18 0944    Education provided  Yes    Education Details  AD positioning, body posture and resting position ( resting with pillow below amputation in order to hold opposite(L) in order to increase LE support in attempt ot reduce LB discomfort. Pt also Educated on hip alinement while navigating around objects in the  home ( keeping hips fwd in line with trunk.)    Person(s) Educated  Patient    Methods  Explanation;Demonstration;Verbal cues    Comprehension  Verbalized understanding;Returned demonstration;Verbal cues required  PT Short Term Goals - 01/12/18 1323      PT SHORT TERM GOAL #1   Title  Patient verbalizes & demonstrates proper donning with correct rotation.  (All STGs Target Date: 02/03/2018)    Time  4    Period  Weeks    Status  On-going    Target Date  02/03/18      PT SHORT TERM GOAL #2   Title  Patient able to reach 5" & to floor with cane support with supervision.     Time  4    Period  Weeks    Status  New      PT SHORT TERM GOAL #3   Title  Patient demonstrates standing ADLs like cooking in kitchen or laundry with prosthesis with supervision.     Time  4    Period  Weeks    Status  New      PT SHORT TERM GOAL #4   Title  Patient ambulates 200' with cane & prosthesis with supervision.     Time  4    Period  Weeks    Status  New      PT SHORT TERM GOAL #5   Title  Patient negotiates stairs (1 rails), ramps & curbs with cane & prosthesis with minimal assist    Time  4    Period  Weeks    Status  New        PT Long Term Goals - 01/05/18 1135      PT LONG TERM GOAL #1   Title  Patient verbalizes & demonstrates proper prosthetic care to enable safe use of prosthesis. (All LTGs Target Date: 03/04/2018)    Time  4    Period  Months    Status  On-going    Target Date  03/04/18      PT LONG TERM GOAL #2   Title  Patient tolerates wear of prosthesis >90% of awake hours without skin issues or limb pain to enable function during her day.     Time  4    Period  Months    Status  On-going    Target Date  03/04/18      PT LONG TERM GOAL #3   Title  Patient ambulates household around furniture carrying items with cane or less & prosthesis modified independent.     Time  4    Period  Months    Status  On-going    Target Date  03/04/18      PT LONG TERM GOAL #4    Title  Berg Balance >/= 45/56 to indicate lower fall risk.     Time  4    Period  Months    Status  On-going    Target Date  03/04/18      PT LONG TERM GOAL #5   Title  Patient ambulates >1000' with LRAD & prosthesis outdoors including grass, ramps & curbs modified independent.     Time  4    Period  Months    Status  On-going    Target Date  03/04/18      PT LONG TERM GOAL #6   Title  Patient performs standing ADLs including cooking, laundry, sweeping, etc with prosthesis & LRAD modified indepedent.     Time  4    Period  Months    Status  On-going    Target Date  03/04/18  Plan - 01/24/18 1026    Clinical Impression Statement  Pt continues to progress towards her LTGs tolerating increase UE demands during ambulation (open close doors, transporting kitchen Items, and retrieving items from ground) and outdoor ambulation over uneven surfaces. Pt demonstrated increased confidence, and balance( no LOB),  during these tasks  and continues to verbalize any errors she makes and corrections to them.     History and Personal Factors relevant to plan of care:  Right TFA, Arthritis , CAD    Clinical Presentation  Evolving    Rehab Potential  Good    Clinical Impairments Affecting Rehab Potential  right TFA, arthritis, CAD,A-Fib, PVD, CVA, Glaucoma, migraines, HTN, NSTEMI,  knee pain/left torn meniscus,     PT Frequency  2x / week    PT Duration  8 weeks    PT Treatment/Interventions  ADLs/Self Care Home Management;DME Instruction;Gait training;Stair training;Functional mobility training;Therapeutic activities;Therapeutic exercise;Balance training;Neuromuscular re-education;Patient/family education;Prosthetic Training    PT Next Visit Plan  Pt to continue to work towards [place]. Increasing outdoor ambulation distance, carrying house hold items Mod I, and completeitng home care ADs. Pt will also continue to work on balance training in order to improve Berg score.      Consulted and Agree with Plan of Care  Patient       Patient will benefit from skilled therapeutic intervention in order to improve the following deficits and impairments:  Abnormal gait, Decreased activity tolerance, Decreased balance, Decreased endurance, Decreased knowledge of use of DME, Decreased mobility, Decreased strength, Prosthetic Dependency, Postural dysfunction  Visit Diagnosis: Other abnormalities of gait and mobility  Muscle weakness (generalized)  Repeated falls  Unsteadiness on feet     Problem List Patient Active Problem List   Diagnosis Date Noted  . Phantom limb pain ([place])   . Hypertensive crisis   . Induration of skin   . Abnormality of gait   . Hypoalbuminemia due to protein-calorie malnutrition ([place])   . Amputee, above knee, right ([place]) 05/25/2017  . Unilateral AKA, right ([place])   . Elevated troponin   . PVD (peripheral vascular disease) ([place])   . Benign essential HTN   . Acute blood loss anemia   . Ischemia of right lower extremity 05/17/2017  . Thrombosis of right femoral artery ([place]) 01/09/2017  . Ischemia of extremity 01/09/2017  . History of colonic polyps 07/30/2016  . Essential hypertension 01/08/2016  . Polycythemia vera ([place]) 08/20/2015  . Leukocytosis 07/15/2015  . S/P arterial stent right SFA overlapping Bahn covered stents 06/27/2015 06/28/2015  . Claudication ([place]) 06/27/2015  . Carotid artery disease ([place]) 08/24/2014  . Osteoarthritis of left knee 03/27/2014  . External hemorrhoids 06/06/2013  . PAF (paroxysmal atrial fibrillation), maintaing SR 05/11/2013  . Chronic anticoagulation, new- eliquis 05/11/2013  . CAD (coronary artery disease), per cath 04/27/13, non obstructive disease 20-30% LM; LAD 30%; 50-60% OM2; RCA 40%; EF 65-70% 05/11/2013  . Mitral regurgitation,2+ by cath 05/11/2013  . Atrial fibrillation with RVR, hx of PAF previously 04/27/2013  . PAD (peripheral artery disease), Lt carotid 50-70%: , Hx stent Lt exteral iliac &  RSFA stent, known occl. LSFA  04/27/2013  . Hyperlipidemia 04/27/2013  . History of CVA (cerebrovascular accident) 04/27/2013  . NSTEMI (non-ST elevated myocardial infarction) ([place]) 04/27/2013   Waunita Schooner SPT 01/24/2018, 1:21 PM  Jamey Reas PT, DPT 01/24/2018, 2:35 PM  [facility] [address] [place], [place], [address] Phone: [phone]   Fax:  [phone]  Name:  FOY MUNGIA MRN: [id number] Date of Birth: October 24, 1951

## 2018-01-26 ENCOUNTER — Ambulatory Visit: Payer: Medicare Other | Admitting: Physical Therapy

## 2018-01-26 ENCOUNTER — Encounter: Payer: Self-pay | Admitting: Physical Therapy

## 2018-01-26 DIAGNOSIS — R2689 Other abnormalities of gait and mobility: Secondary | ICD-10-CM | POA: Diagnosis not present

## 2018-01-26 DIAGNOSIS — R296 Repeated falls: Secondary | ICD-10-CM

## 2018-01-26 DIAGNOSIS — R2681 Unsteadiness on feet: Secondary | ICD-10-CM

## 2018-01-26 DIAGNOSIS — M6281 Muscle weakness (generalized): Secondary | ICD-10-CM

## 2018-01-26 NOTE — Therapy (Deleted)
[facility] [address] [place], [place], [address] Phone: [phone]   Fax:  [phone]  Physical Therapy Treatment  Patient Details  Name: [name] MRN: [id number] Date of Birth: 06-Apr-1951 Referring Provider: Jamse Arn, MD   Encounter Date: 01/26/2018  PT End of Session - 01/26/18 1209    Visit Number  19    Number of Visits  29    Date for PT Re-Evaluation  03/04/18    Authorization Type  Medicare progress note every 84ZY visit recert sent visit 13    PT Start Time  0930    PT Stop Time  1020    PT Time Calculation (min)  50 min    Equipment Utilized During Treatment  Gait belt    Activity Tolerance  Patient tolerated treatment well    Behavior During Therapy  [facility] for tasks assessed/performed       Past Medical History:  Diagnosis Date  . Arthritis    "fingers, some in my knees" (01/28/2016)  . CAD (coronary artery disease), per cath 04/27/13, non obstructive disease, EF 65-70% 05/11/2013  . Carotid disease, bilateral ([place]) 09/2013   Lt carotid 50-69% stentosis, less on rt  . Dysrhythmia   . Fatty liver   . GERD (gastroesophageal reflux disease)   . Glaucoma   . H/O cardiovascular stress test 12/27/2009   negative for ischemia  . Headache    "occasional since eye pressure regulated" (01/28/2016)  . Hyperlipidemia   . Hypertension   . Leukocytosis 07/15/2015  . Migraine    "stopped w/laser holes to relieve pressure in my eyes; had them 3-4 times/wk before taht" (01/28/2016)  . Mitral regurgitation,2+ by cath 05/11/2013  . NSTEMI (non-ST elevated myocardial infarction) ([place]) 04/27/2013  . Pain    BOTH KNEES - PT HAS TORN MENISCUS LEFT KNEE  . Paroxysmal atrial fibrillation (HCC)    hx of a fib on ASA  AND ELIQUIS   . Polycythemia vera ([place]) 08/20/2015   JAK-2 positive 07/19/15  . PVD (peripheral vascular disease) with claudication ([place]) 07/2006   previous L ext iliac stent and rt SFA stent, known  occluded Lt SFA  . S/P cardiac cath 04/27/13   NON OBSTRUCTIVE DISEASE, 2+ mr, ef 65-70%  . Statin intolerance   . Stroke ([place]) 2006   SWELLING ALL OVER AND RT SIDE OF FACE DRAWN AND SPEECH SLURRED AND NUMBNESS ON RIGHT SIDE-- ALL RESOLVED    Past Surgical History:  Procedure Laterality Date  . AMPUTATION Right 05/21/2017   Procedure: AMPUTATION ABOVE KNEE;  Surgeon: Rosetta Posner, MD;  Location: [facility];  Service: Vascular;  Laterality: Right;  . CARDIAC CATHETERIZATION  04/27/13   non occlusive disease with mild to mod. calcified lesions in ostial LM and proximal LAD and moderate prox. RC AND DISTAL AV GROOVE LCX, ef  . CATARACT EXTRACTION W/PHACO Right 03/21/2015   Procedure: CATARACT EXTRACTION PHACO AND INTRAOCULAR LENS PLACEMENT RIGHT EYE;  Surgeon: Baruch Goldmann, MD;  Location: [facility];  Service: Ophthalmology;  Laterality: Right;  CDE:5.10  . CATARACT EXTRACTION W/PHACO Left 05/16/2015   Procedure: CATARACT EXTRACTION PHACO AND INTRAOCULAR LENS PLACEMENT (IOC);  Surgeon: Baruch Goldmann, MD;  Location: [facility];  Service: Ophthalmology;  Laterality: Left;  CDE 5.57  . [place]  . COLONOSCOPY N/A 09/11/2016   Procedure: COLONOSCOPY;  Surgeon: Rogene Houston, MD;  [facility];  Service: Endoscopy;  Laterality: N/A;  730-moved to 1:00 Lelon Frohlich  notified pt  . EMBOLECTOMY Right 02/01/2016   Procedure: Thrombectomy of Right Femoral-Popliteal Bypass Graft; Endarterectomy of Right Below Knee Popliteal Artery and Tibial Peroneal Trunk with Bovine Pericardium Patch Angioplasty.;  Surgeon: Angelia Mould, MD;  Location: [facility];  Service: Vascular;  Laterality: Right;  . FEMORAL-POPLITEAL BYPASS GRAFT Right 01/31/2016   Procedure: BYPASS GRAFT RIGHT COMMON  FEMORALTO BELOW KNEE POPLITEAL ARTERY BYPASS GRAFT USING 6MM PROPATEN GORTEX GRAFT;  Surgeon: Mal Misty, MD;  Location: [facility];  Service: Vascular;  Laterality: Right;  . FEMORAL-POPLITEAL BYPASS GRAFT Right 01/09/2017    Procedure: THROMBECTOMY OF RIGHT FEMORAL-POPLITEAL  ARTERY BYPASS GRAFT; THROMBECTOMY RIGHT  TIBIAL VESSELS;  Surgeon: [name], MD;  Location: [facility];  Service: Vascular;  Laterality: Right;  . FEMORAL-POPLITEAL BYPASS GRAFT Right 05/17/2017   Procedure: RIGHT FEMORAL-TIBIAL PERONEAL TRUNK ARTERY BYPASS GRAFT USING 62mX80cm PROPATEN GRAFT WITH REMOVABLE RING;  Surgeon: ERosetta Posner MD;  [place] [facility]  Service: Vascular;  Laterality: Right;  . FRACTURE SURGERY    . ILIAC ARTERY STENT  07/2006   external iliac and Rt SFA stent 07/2006 AND THE LEFT WAS IN 2006  . KNEE ARTHROSCOPY WITH MEDIAL MENISECTOMY Left 03/27/2014   Procedure: left knee arthorscopy with medial chondraplasty of the medial femoral and patella, medial microfracture technique of medial femoral condyl;  Surgeon: RTobi Bastos MD;  [facility];  Service: Orthopedics;  Laterality: Left;  . LEFT HEART CATHETERIZATION WITH CORONARY ANGIOGRAM Right 04/27/2013   Procedure: LEFT HEART CATHETERIZATION WITH CORONARY ANGIOGRAM;  Surgeon: DLeonie Man MD;  [facility];  Service: Cardiovascular;  Laterality: Right;  . LOWER EXTREMITY ANGIOGRAM Right 01/31/2016   Procedure: INTRAOP RIGHT LOWER EXTREMITY ANGIOGRAM;  Surgeon: JMal Misty MD;  [place] [facility]  Service: Vascular;  Laterality: Right;  . ORIF WRIST FRACTURE Right 1983  . OVARIAN CYST REMOVAL Right   . PATCH ANGIOPLASTY Right 01/31/2016   Procedure: RIGHT COMMON FEMORAL AND PROFUNDA FEMORIS ENDARECTOMY WITH PATCH ANGIOPLASTY;  Surgeon: JMal Misty MD;  [place] [facility]  Service: Vascular;  Laterality: Right;  . PATCH ANGIOPLASTY Right 01/09/2017   Procedure: PATCH ANGIOPLASTY RIGHT POPLITEAL ARTERY BYPASS GRAFT;  Surgeon: CElam Dutch MD;  [facility]  Service: Vascular;  Laterality: Right;  . PERIPHERAL VASCULAR CATHETERIZATION N/A 06/27/2015   Procedure: Lower Extremity Angiography;  Surgeon: JLorretta Harp MD;  [facility];  Service: Cardiovascular;  Laterality: N/A;  . PERIPHERAL VASCULAR CATHETERIZATION Bilateral 01/29/2016   Procedure: Lower Extremity Angiography;  Surgeon: MWellington Hampshire MD;  [facility];  Service: Cardiovascular;  Laterality: Bilateral;  . PERIPHERAL VASCULAR CATHETERIZATION N/A 01/29/2016   Procedure: Abdominal Aortogram;  Surgeon: MWellington Hampshire MD;  [facility];  Service: Cardiovascular;  Laterality: N/A;  . REFRACTIVE SURGERY Bilateral    "6 in one eye; 7 in the other; to relieve pressure; not glaucoma" (01/28/2016)  . SFA Right 06/27/2015   overlapping Bahn covered stents  . TUBAL LIGATION  1983  . VEIN HARVEST Left 05/17/2017   Procedure: LEFT LEG GREATER SAPHENOUS VEIN HARVEST;  Surgeon: ERosetta Posner MD;  [place]  Service: Vascular;  Laterality: Left;  .Marland KitchenVEIN REPAIR Right 01/31/2016   Procedure: RIGHT GREATER SAPHENOUS VEIN EXAMNED BUT NOT REMOVED;  Surgeon: JMal Misty MD;  [place] [facility]  Service: Vascular;  Laterality: Right;    There were no vitals filed for this  visit.  Subjective Assessment - 01/26/18 0938    Subjective  pt reports no falls since last PT session. Pt reports she is having some discomfort with Prosthesis position after sitting long periods. pt reports having to realigned prosthesis in rest room prior to treatment session.      Pertinent History  right TFA, arthritis, CAD,A-Fib, PVD, CVA, Glaucoma, migraines, HTN, NSTEMI,  knee pain/left torn meniscus,     Limitations  Lifting;Standing;Walking    Patient Stated Goals  To use prosthesis walking. Previously 2-4 miles.     Currently in Pain?  No/denies                      [facility] Adult PT Treatment/Exercise - 01/26/18 1000      Transfers   Transfers  Stand to Sit;Sit to Stand    Sit to Stand  6: Modified independent (Device/Increase time);With upper extremity assist;With armrests;From chair/3-in-1    Stand to Sit  5: Supervision;With  armrests;With upper extremity assist;To chair/3-in-1    Stand to Sit Details (indicate cue type and reason)  Verbal cues for safe use of DME/AE    Stand to Sit Details  pt continues to occasionally sit without switching cane to opposite hand to create a safe  decent.      Ambulation/Gait   Ambulation/Gait  Yes    Ambulation/Gait Assistance  3: Mod assist;4: Min assist    Ambulation/Gait Assistance Details  Min. A with cane without additional tasks, Mod A. with cane and head turning tasks.    Ambulation Distance (Feet)  920 Feet longest 520 ft, sit break, 200 ft stand break 200 ft    Assistive device  Straight cane;Crutches    Gait Pattern  Step-through pattern;Narrow base of support;Decreased stride length;Decreased weight shift to right    Ambulation Surface  Indoor;Level    Stairs  No      High Level Balance   High Level Balance Activities  Head turns Horizontal, vertical, Diagonal     High Level Balance Comments  PT demo, pt require constant verbal cues for head turning. Pt demonstrated verbal understanding and gave her self-verbal cues for head turning as exercise progress. Pt in hallway to provide visual reference and working on maintaining path and pace of gait.       Prosthetics   Current prosthetic wear tolerance (days/week)   daily    Current prosthetic wear tolerance (#hours/day)   she reports most of awake hours    Residual limb condition   intact per pt report    Education Provided  Residual limb care;Other (comment) residual limb shrinkage and forms of suspension    Person(s) Educated  Patient    Education Method  Explanation    Education Method  Verbalized understanding             PT Education - 01/26/18 1205    Education provided  Yes    Education Details  sitting " L" cut pillow to relieve pressure/ correct hip alignment in sitting to reduce discomfort at R hip while sitting for long  periods of time. distance measuring via stride count  in order for PT to be  able to determine distance ambulated to bus stop    Person(s) Educated  Patient    Methods  Explanation;Demonstration;Tactile cues;Verbal cues;Handout    Comprehension  Verbalized understanding;Verbal cues required;Need further instruction;Returned demonstration       PT Short Term Goals - 01/12/18 1323      PT  SHORT TERM GOAL #1   Title  Patient verbalizes & demonstrates proper donning with correct rotation.  (All STGs Target Date: 02/03/2018)    Time  4    Period  Weeks    Status  On-going    Target Date  02/03/18      PT SHORT TERM GOAL #2   Title  Patient able to reach 5" & to floor with cane support with supervision.     Time  4    Period  Weeks    Status  New      PT SHORT TERM GOAL #3   Title  Patient demonstrates standing ADLs like cooking in kitchen or laundry with prosthesis with supervision.     Time  4    Period  Weeks    Status  New      PT SHORT TERM GOAL #4   Title  Patient ambulates 200' with cane & prosthesis with supervision.     Time  4    Period  Weeks    Status  New      PT SHORT TERM GOAL #5   Title  Patient negotiates stairs (1 rails), ramps & curbs with cane & prosthesis with minimal assist    Time  4    Period  Weeks    Status  New        PT Long Term Goals - 01/05/18 1135      PT LONG TERM GOAL #1   Title  Patient verbalizes & demonstrates proper prosthetic care to enable safe use of prosthesis. (All LTGs Target Date: 03/04/2018)    Time  4    Period  Months    Status  On-going    Target Date  03/04/18      PT LONG TERM GOAL #2   Title  Patient tolerates wear of prosthesis >90% of awake hours without skin issues or limb pain to enable function during her day.     Time  4    Period  Months    Status  On-going    Target Date  03/04/18      PT LONG TERM GOAL #3   Title  Patient ambulates household around furniture carrying items with cane or less & prosthesis modified independent.     Time  4    Period  Months    Status  On-going     Target Date  03/04/18      PT LONG TERM GOAL #4   Title  Berg Balance >/= 45/56 to indicate lower fall risk.     Time  4    Period  Months    Status  On-going    Target Date  03/04/18      PT LONG TERM GOAL #5   Title  Patient ambulates >1000' with LRAD & prosthesis outdoors including grass, ramps & curbs modified independent.     Time  4    Period  Months    Status  On-going    Target Date  03/04/18      PT LONG TERM GOAL #6   Title  Patient performs standing ADLs including cooking, laundry, sweeping, etc with prosthesis & LRAD modified indepedent.     Time  4    Period  Months    Status  On-going    Target Date  03/04/18            Plan - 01/26/18 1211    Clinical Impression Statement  Pt  demonstrated significant increase in ambulation distance with straight cane with two breaks( one sitting the other standing). Pt demonstrated difficulty with head turning tasks while ambulating with Straight cane and hand held assist. pt continues to be apprehensive to new  tasks but builds confidence quickly. Pt also continues to demonstrate unsafe stand to sit mechanics although she reports knowing it was incorrect. Incorrect method may have be used as a result of fatigue after walking.     History and Personal Factors relevant to plan of care:  R TFA, arthritis, CAD    Clinical Presentation  Evolving    Rehab Potential  Good    Clinical Impairments Affecting Rehab Potential  right TFA, arthritis, CAD,A-Fib, PVD, CVA, Glaucoma, migraines, HTN, NSTEMI,  knee pain/left torn meniscus,     PT Frequency  2x / week    PT Duration  8 weeks    PT Treatment/Interventions  ADLs/Self Care Home Management;DME Instruction;Gait training;Stair training;Functional mobility training;Therapeutic activities;Therapeutic exercise;Balance training;Neuromuscular re-education;Patient/family education;Prosthetic Training    PT Next Visit Plan  PT to check progress on STGs( 02/03/2018) Pt to continue to work on head  turning tasks while walking at a consistent pace in hallway. Progressing this task towards open areas with head turns. Pt to also continue to address gait over grass, curbs, raps, as well as stepping over objects.    Consulted and Agree with Plan of Care  Patient       Patient will benefit from skilled therapeutic intervention in order to improve the following deficits and impairments:  Abnormal gait, Decreased activity tolerance, Decreased balance, Decreased endurance, Decreased knowledge of use of DME, Decreased mobility, Decreased strength, Prosthetic Dependency, Postural dysfunction  Visit Diagnosis: Other abnormalities of gait and mobility  Muscle weakness (generalized)  Repeated falls  Unsteadiness on feet     Problem List Patient Active Problem List   Diagnosis Date Noted  . Phantom limb pain ([place])   . Hypertensive crisis   . Induration of skin   . Abnormality of gait   . Hypoalbuminemia due to protein-calorie malnutrition ([place])   . Amputee, above knee, right ([place]) 05/25/2017  . Unilateral AKA, right ([place])   . Elevated troponin   . PVD (peripheral vascular disease) ([place])   . Benign essential HTN   . Acute blood loss anemia   . Ischemia of right lower extremity 05/17/2017  . Thrombosis of right femoral artery ([place]) 01/09/2017  . Ischemia of extremity 01/09/2017  . History of colonic polyps 07/30/2016  . Essential hypertension 01/08/2016  . Polycythemia vera ([place]) 08/20/2015  . Leukocytosis 07/15/2015  . S/P arterial stent right SFA overlapping Bahn covered stents 06/27/2015 06/28/2015  . Claudication ([place]) 06/27/2015  . Carotid artery disease ([place]) 08/24/2014  . Osteoarthritis of left knee 03/27/2014  . External hemorrhoids 06/06/2013  . PAF (paroxysmal atrial fibrillation), maintaing SR 05/11/2013  . Chronic anticoagulation, new- eliquis 05/11/2013  . CAD (coronary artery disease), per cath 04/27/13, non obstructive disease 20-30% LM; LAD 30%; 50-60% OM2; RCA 40%; EF  65-70% 05/11/2013  . Mitral regurgitation,2+ by cath 05/11/2013  . Atrial fibrillation with RVR, hx of PAF previously 04/27/2013  . PAD (peripheral artery disease), Lt carotid 50-70%: , Hx stent Lt exteral iliac & RSFA stent, known occl. LSFA  04/27/2013  . Hyperlipidemia 04/27/2013  . History of CVA (cerebrovascular accident) 04/27/2013  . NSTEMI (non-ST elevated myocardial infarction) ([place]) 04/27/2013   Waunita Schooner, SPT 01/26/2018, 1:10 PM  Cortland Crehan PT, DPT 01/26/2018, 1:35 PM  [facility]  [facility] [address] [place], [place], [address] Phone: [phone]   Fax:  [phone]  Name: [name] MRN: [id number] Date of Birth: [birth year]

## 2018-01-26 NOTE — Therapy (Signed)
[facility] [address] [place], [place], [address] Phone: [phone]   Fax:  [phone]  Physical Therapy Treatment  Patient Details  Name: [name] MRN: [id number] Date of Birth: 1951/02/18 Referring Provider: Jamse Arn, MD   Encounter Date: 01/26/2018  PT End of Session - 01/26/18 1209    Visit Number  19    Number of Visits  29    Date for PT Re-Evaluation  03/04/18    Authorization Type  Medicare progress note every 76AU visit recert sent visit 13    PT Start Time  0930    PT Stop Time  1020    PT Time Calculation (min)  50 min    Equipment Utilized During Treatment  Gait belt    Activity Tolerance  Patient tolerated treatment well    Behavior During Therapy  [facility] for tasks assessed/performed       Past Medical History:  Diagnosis Date  . Arthritis    "fingers, some in my knees" (01/28/2016)  . CAD (coronary artery disease), per cath 04/27/13, non obstructive disease, EF 65-70% 05/11/2013  . Carotid disease, bilateral ([place]) 09/2013   Lt carotid 50-69% stentosis, less on rt  . Dysrhythmia   . Fatty liver   . GERD (gastroesophageal reflux disease)   . Glaucoma   . H/O cardiovascular stress test 12/27/2009   negative for ischemia  . Headache    "occasional since eye pressure regulated" (01/28/2016)  . Hyperlipidemia   . Hypertension   . Leukocytosis 07/15/2015  . Migraine    "stopped w/laser holes to relieve pressure in my eyes; had them 3-4 times/wk before taht" (01/28/2016)  . Mitral regurgitation,2+ by cath 05/11/2013  . NSTEMI (non-ST elevated myocardial infarction) ([place]) 04/27/2013  . Pain    BOTH KNEES - PT HAS TORN MENISCUS LEFT KNEE  . Paroxysmal atrial fibrillation (HCC)    hx of a fib on ASA  AND ELIQUIS   . Polycythemia vera ([place]) 08/20/2015   JAK-2 positive 07/19/15  . PVD (peripheral vascular disease) with claudication ([place]) 07/2006   previous L ext iliac stent and rt SFA stent, known  occluded Lt SFA  . S/P cardiac cath 04/27/13   NON OBSTRUCTIVE DISEASE, 2+ mr, ef 65-70%  . Statin intolerance   . Stroke ([place]) 2006   SWELLING ALL OVER AND RT SIDE OF FACE DRAWN AND SPEECH SLURRED AND NUMBNESS ON RIGHT SIDE-- ALL RESOLVED    Past Surgical History:  Procedure Laterality Date  . AMPUTATION Right 05/21/2017   Procedure: AMPUTATION ABOVE KNEE;  Surgeon: Rosetta Posner, MD;  Location: [facility];  Service: Vascular;  Laterality: Right;  . CARDIAC CATHETERIZATION  04/27/13   non occlusive disease with mild to mod. calcified lesions in ostial LM and proximal LAD and moderate prox. RC AND DISTAL AV GROOVE LCX, ef  . CATARACT EXTRACTION W/PHACO Right 03/21/2015   Procedure: CATARACT EXTRACTION PHACO AND INTRAOCULAR LENS PLACEMENT RIGHT EYE;  Surgeon: Baruch Goldmann, MD;  Location: [facility];  Service: Ophthalmology;  Laterality: Right;  CDE:5.10  . CATARACT EXTRACTION W/PHACO Left 05/16/2015   Procedure: CATARACT EXTRACTION PHACO AND INTRAOCULAR LENS PLACEMENT (IOC);  Surgeon: Baruch Goldmann, MD;  Location: [facility];  Service: Ophthalmology;  Laterality: Left;  CDE 5.57  . [place]  . COLONOSCOPY N/A 09/11/2016   Procedure: COLONOSCOPY;  Surgeon: Rogene Houston, MD;  [facility];  Service: Endoscopy;  Laterality: N/A;  730-moved to 1:00 Lelon Frohlich  notified pt  . EMBOLECTOMY Right 02/01/2016   Procedure: Thrombectomy of Right Femoral-Popliteal Bypass Graft; Endarterectomy of Right Below Knee Popliteal Artery and Tibial Peroneal Trunk with Bovine Pericardium Patch Angioplasty.;  Surgeon: Angelia Mould, MD;  Location: [place];  Service: Vascular;  Laterality: Right;  . FEMORAL-POPLITEAL BYPASS GRAFT Right 01/31/2016   Procedure: BYPASS GRAFT RIGHT COMMON  FEMORALTO BELOW KNEE POPLITEAL ARTERY BYPASS GRAFT USING 6MM PROPATEN GORTEX GRAFT;  Surgeon: Mal Misty, MD;  Location: [facility];  Service: Vascular;  Laterality: Right;  . FEMORAL-POPLITEAL BYPASS GRAFT Right 01/09/2017    Procedure: THROMBECTOMY OF RIGHT FEMORAL-POPLITEAL  ARTERY BYPASS GRAFT; THROMBECTOMY RIGHT  TIBIAL VESSELS;  Surgeon: [name], MD;  Location: [facility];  Service: Vascular;  Laterality: Right;  . FEMORAL-POPLITEAL BYPASS GRAFT Right 05/17/2017   Procedure: RIGHT FEMORAL-TIBIAL PERONEAL TRUNK ARTERY BYPASS GRAFT USING 62mX80cm PROPATEN GRAFT WITH REMOVABLE RING;  Surgeon: ERosetta Posner MD;  [place] [facility]  Service: Vascular;  Laterality: Right;  . FRACTURE SURGERY    . ILIAC ARTERY STENT  07/2006   external iliac and Rt SFA stent 07/2006 AND THE LEFT WAS IN 2006  . KNEE ARTHROSCOPY WITH MEDIAL MENISECTOMY Left 03/27/2014   Procedure: left knee arthorscopy with medial chondraplasty of the medial femoral and patella, medial microfracture technique of medial femoral condyl;  Surgeon: RTobi Bastos MD;  [facility];  Service: Orthopedics;  Laterality: Left;  . LEFT HEART CATHETERIZATION WITH CORONARY ANGIOGRAM Right 04/27/2013   Procedure: LEFT HEART CATHETERIZATION WITH CORONARY ANGIOGRAM;  Surgeon: DLeonie Man MD;  [facility];  Service: Cardiovascular;  Laterality: Right;  . LOWER EXTREMITY ANGIOGRAM Right 01/31/2016   Procedure: INTRAOP RIGHT LOWER EXTREMITY ANGIOGRAM;  Surgeon: JMal Misty MD;  [place] [facility]  Service: Vascular;  Laterality: Right;  . ORIF WRIST FRACTURE Right 1983  . OVARIAN CYST REMOVAL Right   . PATCH ANGIOPLASTY Right 01/31/2016   Procedure: RIGHT COMMON FEMORAL AND PROFUNDA FEMORIS ENDARECTOMY WITH PATCH ANGIOPLASTY;  Surgeon: JMal Misty MD;  [place] [facility]  Service: Vascular;  Laterality: Right;  . PATCH ANGIOPLASTY Right 01/09/2017   Procedure: PATCH ANGIOPLASTY RIGHT POPLITEAL ARTERY BYPASS GRAFT;  Surgeon: CElam Dutch MD;  [facility] [facility]  Service: Vascular;  Laterality: Right;  . PERIPHERAL VASCULAR CATHETERIZATION N/A 06/27/2015   Procedure: Lower Extremity Angiography;  Surgeon: JLorretta Harp MD;  [facility];  Service: Cardiovascular;  Laterality: N/A;  . PERIPHERAL VASCULAR CATHETERIZATION Bilateral 01/29/2016   Procedure: Lower Extremity Angiography;  Surgeon: MWellington Hampshire MD;  [facility];  Service: Cardiovascular;  Laterality: Bilateral;  . PERIPHERAL VASCULAR CATHETERIZATION N/A 01/29/2016   Procedure: Abdominal Aortogram;  Surgeon: MWellington Hampshire MD;  [facility];  Service: Cardiovascular;  Laterality: N/A;  . REFRACTIVE SURGERY Bilateral    "6 in one eye; 7 in the other; to relieve pressure; not glaucoma" (01/28/2016)  . SFA Right 06/27/2015   overlapping Bahn covered stents  . TUBAL LIGATION  1983  . VEIN HARVEST Left 05/17/2017   Procedure: LEFT LEG GREATER SAPHENOUS VEIN HARVEST;  Surgeon: ERosetta Posner MD;  [place] [facility]  Service: Vascular;  Laterality: Left;  .Marland KitchenVEIN REPAIR Right 01/31/2016   Procedure: RIGHT GREATER SAPHENOUS VEIN EXAMNED BUT NOT REMOVED;  Surgeon: JMal Misty MD;  [place] [facility]  Service: Vascular;  Laterality: Right;    There were no vitals filed for this  visit.  Subjective Assessment - 01/26/18 0938    Subjective  pt reports no falls since last PT session. Pt reports she is having some discomfort with Prosthesis position after sitting long periods. pt reports having to realigned prosthesis in rest room prior to treatment session.      Pertinent History  right TFA, arthritis, CAD,A-Fib, PVD, CVA, Glaucoma, migraines, HTN, NSTEMI,  knee pain/left torn meniscus,     Limitations  Lifting;Standing;Walking    Patient Stated Goals  To use prosthesis walking. Previously 2-4 miles.     Currently in Pain?  No/denies                      [facility] Adult PT Treatment/Exercise - 01/26/18 1000      Transfers   Transfers  Stand to Sit;Sit to Stand    Sit to Stand  6: Modified independent (Device/Increase time);With upper extremity assist;With armrests;From chair/3-in-1    Stand to Sit  5: Supervision;With  armrests;With upper extremity assist;To chair/3-in-1    Stand to Sit Details (indicate cue type and reason)  Verbal cues for safe use of DME/AE    Stand to Sit Details  pt continues to occasionally sit without switching cane to opposite hand to create a safe  decent.      Ambulation/Gait   Ambulation/Gait  Yes    Ambulation/Gait Assistance  3: Mod assist;4: Min assist    Ambulation/Gait Assistance Details  Min. A with cane without additional tasks, Mod A. with cane and head turning tasks.    Ambulation Distance (Feet)  920 Feet longest 520 ft, sit break, 200 ft stand break 200 ft    Assistive device  Straight cane;Crutches    Gait Pattern  Step-through pattern;Narrow base of support;Decreased stride length;Decreased weight shift to right    Ambulation Surface  Indoor;Level    Stairs  No      High Level Balance   High Level Balance Activities  Head turns Horizontal, vertical, Diagonal     High Level Balance Comments  PT demo, pt require constant verbal cues for head turning. Pt demonstrated verbal understanding and gave her self-verbal cues for head turning as exercise progress. Pt in hallway to provide visual reference and working on maintaining path and pace of gait.       Prosthetics   Current prosthetic wear tolerance (days/week)   daily    Current prosthetic wear tolerance (#hours/day)   she reports most of awake hours    Residual limb condition   intact per pt report    Education Provided  Residual limb care;Other (comment) residual limb shrinkage and forms of suspension    Person(s) Educated  Patient    Education Method  Explanation    Education Method  Verbalized understanding             PT Education - 01/26/18 1205    Education provided  Yes    Education Details  sitting " L" cut pillow to relieve pressure/ correct hip alignment in sitting to reduce discomfort at R hip while sitting for long  periods of time. distance measuring via stride count  in order for PT to be  able to determine distance ambulated to bus stop    Person(s) Educated  Patient    Methods  Explanation;Demonstration;Tactile cues;Verbal cues;Handout    Comprehension  Verbalized understanding;Verbal cues required;Need further instruction;Returned demonstration       PT Short Term Goals - 01/12/18 1323      PT  SHORT TERM GOAL #1   Title  Patient verbalizes & demonstrates proper donning with correct rotation.  (All STGs Target Date: 02/03/2018)    Time  4    Period  Weeks    Status  On-going    Target Date  02/03/18      PT SHORT TERM GOAL #2   Title  Patient able to reach 5" & to floor with cane support with supervision.     Time  4    Period  Weeks    Status  New      PT SHORT TERM GOAL #3   Title  Patient demonstrates standing ADLs like cooking in kitchen or laundry with prosthesis with supervision.     Time  4    Period  Weeks    Status  New      PT SHORT TERM GOAL #4   Title  Patient ambulates 200' with cane & prosthesis with supervision.     Time  4    Period  Weeks    Status  New      PT SHORT TERM GOAL #5   Title  Patient negotiates stairs (1 rails), ramps & curbs with cane & prosthesis with minimal assist    Time  4    Period  Weeks    Status  New        PT Long Term Goals - 01/05/18 1135      PT LONG TERM GOAL #1   Title  Patient verbalizes & demonstrates proper prosthetic care to enable safe use of prosthesis. (All LTGs Target Date: 03/04/2018)    Time  4    Period  Months    Status  On-going    Target Date  03/04/18      PT LONG TERM GOAL #2   Title  Patient tolerates wear of prosthesis >90% of awake hours without skin issues or limb pain to enable function during her day.     Time  4    Period  Months    Status  On-going    Target Date  03/04/18      PT LONG TERM GOAL #3   Title  Patient ambulates household around furniture carrying items with cane or less & prosthesis modified independent.     Time  4    Period  Months    Status  On-going     Target Date  03/04/18      PT LONG TERM GOAL #4   Title  Berg Balance >/= 45/56 to indicate lower fall risk.     Time  4    Period  Months    Status  On-going    Target Date  03/04/18      PT LONG TERM GOAL #5   Title  Patient ambulates >1000' with LRAD & prosthesis outdoors including grass, ramps & curbs modified independent.     Time  4    Period  Months    Status  On-going    Target Date  03/04/18      PT LONG TERM GOAL #6   Title  Patient performs standing ADLs including cooking, laundry, sweeping, etc with prosthesis & LRAD modified indepedent.     Time  4    Period  Months    Status  On-going    Target Date  03/04/18            Plan - 01/26/18 1211    Clinical Impression Statement  Pt  demonstrated significant increase in ambulation distance with straight cane with two breaks( one sitting the other standing). Pt demonstrated difficulty with head turning tasks while ambulating with Straight cane and hand held assist. pt continues to be apprehensive to new  tasks but builds confidence quickly. Pt also continues to demonstrate unsafe stand to sit mechanics although she reports knowing it was incorrect. Incorrect method may have be used as a result of fatigue after walking.     History and Personal Factors relevant to plan of care:  R TFA, arthritis, CAD    Clinical Presentation  Evolving    Rehab Potential  Good    Clinical Impairments Affecting Rehab Potential  right TFA, arthritis, CAD,A-Fib, PVD, CVA, Glaucoma, migraines, HTN, NSTEMI,  knee pain/left torn meniscus,     PT Frequency  2x / week    PT Duration  8 weeks    PT Treatment/Interventions  ADLs/Self Care Home Management;DME Instruction;Gait training;Stair training;Functional mobility training;Therapeutic activities;Therapeutic exercise;Balance training;Neuromuscular re-education;Patient/family education;Prosthetic Training    PT Next Visit Plan  PT to check progress on STGs( 02/03/2018) Pt to continue to work on head  turning tasks while walking at a consistent pace in hallway. Progressing this task towards open areas with head turns. Pt to also continue to address gait over grass, curbs, raps, as well as stepping over objects.    Consulted and Agree with Plan of Care  Patient       Patient will benefit from skilled therapeutic intervention in order to improve the following deficits and impairments:  Abnormal gait, Decreased activity tolerance, Decreased balance, Decreased endurance, Decreased knowledge of use of DME, Decreased mobility, Decreased strength, Prosthetic Dependency, Postural dysfunction  Visit Diagnosis: Other abnormalities of gait and mobility  Muscle weakness (generalized)  Repeated falls  Unsteadiness on feet     Problem List Patient Active Problem List   Diagnosis Date Noted  . Phantom limb pain ([place])   . Hypertensive crisis   . Induration of skin   . Abnormality of gait   . Hypoalbuminemia due to protein-calorie malnutrition ([place])   . Amputee, above knee, right ([place]) 05/25/2017  . Unilateral AKA, right ([place])   . Elevated troponin   . PVD (peripheral vascular disease) ([place])   . Benign essential HTN   . Acute blood loss anemia   . Ischemia of right lower extremity 05/17/2017  . Thrombosis of right femoral artery ([place]) 01/09/2017  . Ischemia of extremity 01/09/2017  . History of colonic polyps 07/30/2016  . Essential hypertension 01/08/2016  . Polycythemia vera ([place]) 08/20/2015  . Leukocytosis 07/15/2015  . S/P arterial stent right SFA overlapping Bahn covered stents 06/27/2015 06/28/2015  . Claudication ([place]) 06/27/2015  . Carotid artery disease ([place]) 08/24/2014  . Osteoarthritis of left knee 03/27/2014  . External hemorrhoids 06/06/2013  . PAF (paroxysmal atrial fibrillation), maintaing SR 05/11/2013  . Chronic anticoagulation, new- eliquis 05/11/2013  . CAD (coronary artery disease), per cath 04/27/13, non obstructive disease 20-30% LM; LAD 30%; 50-60% OM2; RCA 40%; EF  65-70% 05/11/2013  . Mitral regurgitation,2+ by cath 05/11/2013  . Atrial fibrillation with RVR, hx of PAF previously 04/27/2013  . PAD (peripheral artery disease), Lt carotid 50-70%: , Hx stent Lt exteral iliac & RSFA stent, known occl. LSFA  04/27/2013  . Hyperlipidemia 04/27/2013  . History of CVA (cerebrovascular accident) 04/27/2013  . NSTEMI (non-ST elevated myocardial infarction) ([place]) 04/27/2013   Waunita Schooner, SPT 01/26/2018, 1:18 PM  WALDRON,ROBIN PT, DPT 01/26/2018, 1:39 PM  [facility]  [facility] [address] [place], [place], [address] Phone: [phone]   Fax:  [phone]  Name: [name] MRN: [id number] Date of Birth: 10/18/1951

## 2018-01-31 ENCOUNTER — Ambulatory Visit: Payer: Medicare Other | Admitting: Physical Therapy

## 2018-01-31 ENCOUNTER — Encounter: Payer: Self-pay | Admitting: Physical Therapy

## 2018-01-31 DIAGNOSIS — R2681 Unsteadiness on feet: Secondary | ICD-10-CM

## 2018-01-31 DIAGNOSIS — R2689 Other abnormalities of gait and mobility: Secondary | ICD-10-CM | POA: Diagnosis not present

## 2018-01-31 DIAGNOSIS — M6281 Muscle weakness (generalized): Secondary | ICD-10-CM

## 2018-01-31 DIAGNOSIS — R296 Repeated falls: Secondary | ICD-10-CM

## 2018-01-31 NOTE — Therapy (Signed)
[facility] [address] [place], [place], [address] Phone: [phone]   Fax:  [phone]  Physical Therapy Treatment  Patient Details  Name: [name] MRN: [id number] Date of Birth: Sep 26, 1951 Referring Provider: Jamse Arn, MD   Encounter Date: 01/31/2018  PT End of Session - 01/31/18 1037    Visit Number  20    Number of Visits  29    Date for PT Re-Evaluation  03/04/18    Authorization Type  Medicare progress note every 51OA visit recert sent visit 13    PT Start Time  0930    PT Stop Time  1015    PT Time Calculation (min)  45 min    Activity Tolerance  Patient tolerated treatment well    Behavior During Therapy  [facility] for tasks assessed/performed       Past Medical History:  Diagnosis Date  . Arthritis    "fingers, some in my knees" (01/28/2016)  . CAD (coronary artery disease), per cath 04/27/13, non obstructive disease, EF 65-70% 05/11/2013  . Carotid disease, bilateral ([place]) 09/2013   Lt carotid 50-69% stentosis, less on rt  . Dysrhythmia   . Fatty liver   . GERD (gastroesophageal reflux disease)   . Glaucoma   . H/O cardiovascular stress test 12/27/2009   negative for ischemia  . Headache    "occasional since eye pressure regulated" (01/28/2016)  . Hyperlipidemia   . Hypertension   . Leukocytosis 07/15/2015  . Migraine    "stopped w/laser holes to relieve pressure in my eyes; had them 3-4 times/wk before taht" (01/28/2016)  . Mitral regurgitation,2+ by cath 05/11/2013  . NSTEMI (non-ST elevated myocardial infarction) ([place]) 04/27/2013  . Pain    BOTH KNEES - PT HAS TORN MENISCUS LEFT KNEE  . Paroxysmal atrial fibrillation (HCC)    hx of a fib on ASA  AND ELIQUIS   . Polycythemia vera ([place]) 08/20/2015   JAK-2 positive 07/19/15  . PVD (peripheral vascular disease) with claudication ([place]) 07/2006   previous L ext iliac stent and rt SFA stent, known occluded Lt SFA  . S/P cardiac cath 04/27/13   NON  OBSTRUCTIVE DISEASE, 2+ mr, ef 65-70%  . Statin intolerance   . Stroke ([place]) 2006   SWELLING ALL OVER AND RT SIDE OF FACE DRAWN AND SPEECH SLURRED AND NUMBNESS ON RIGHT SIDE-- ALL RESOLVED    Past Surgical History:  Procedure Laterality Date  . AMPUTATION Right 05/21/2017   Procedure: AMPUTATION ABOVE KNEE;  Surgeon: Rosetta Posner, MD;  Location: [facility];  Service: Vascular;  Laterality: Right;  . CARDIAC CATHETERIZATION  04/27/13   non occlusive disease with mild to mod. calcified lesions in ostial LM and proximal LAD and moderate prox. RC AND DISTAL AV GROOVE LCX, ef  . CATARACT EXTRACTION W/PHACO Right 03/21/2015   Procedure: CATARACT EXTRACTION PHACO AND INTRAOCULAR LENS PLACEMENT RIGHT EYE;  Surgeon: Baruch Goldmann, MD;  Location: [facility];  Service: Ophthalmology;  Laterality: Right;  CDE:5.10  . CATARACT EXTRACTION W/PHACO Left 05/16/2015   Procedure: CATARACT EXTRACTION PHACO AND INTRAOCULAR LENS PLACEMENT (IOC);  Surgeon: Baruch Goldmann, MD;  Location: [facility];  Service: Ophthalmology;  Laterality: Left;  CDE 5.57  . [place]  . COLONOSCOPY N/A 09/11/2016   Procedure: COLONOSCOPY;  Surgeon: Rogene Houston, MD;  [facility];  Service: Endoscopy;  Laterality: N/A;  730-moved to 1:00 Ann notified pt  . EMBOLECTOMY Right 02/01/2016   Procedure:  Thrombectomy of Right Femoral-Popliteal Bypass Graft; Endarterectomy of Right Below Knee Popliteal Artery and Tibial Peroneal Trunk with Bovine Pericardium Patch Angioplasty.;  Surgeon: Angelia Mould, MD;  Location: [facility];  Service: Vascular;  Laterality: Right;  . FEMORAL-POPLITEAL BYPASS GRAFT Right 01/31/2016   Procedure: BYPASS GRAFT RIGHT COMMON  FEMORALTO BELOW KNEE POPLITEAL ARTERY BYPASS GRAFT USING 6MM PROPATEN GORTEX GRAFT;  Surgeon: Mal Misty, MD;  Location: [facility];  Service: Vascular;  Laterality: Right;  . FEMORAL-POPLITEAL BYPASS GRAFT Right 01/09/2017   Procedure: THROMBECTOMY OF RIGHT FEMORAL-POPLITEAL  ARTERY  BYPASS GRAFT; THROMBECTOMY RIGHT  TIBIAL VESSELS;  Surgeon: [name], MD;  Location: [facility];  Service: Vascular;  Laterality: Right;  . FEMORAL-POPLITEAL BYPASS GRAFT Right 05/17/2017   Procedure: RIGHT FEMORAL-TIBIAL PERONEAL TRUNK ARTERY BYPASS GRAFT USING 69mX80cm PROPATEN GRAFT WITH REMOVABLE RING;  Surgeon: ERosetta Posner MD;  Location[place] [facility]  Service: Vascular;  Laterality: Right;  . FRACTURE SURGERY    . ILIAC ARTERY STENT  07/2006   external iliac and Rt SFA stent 07/2006 AND THE LEFT WAS IN 2006  . KNEE ARTHROSCOPY WITH MEDIAL MENISECTOMY Left 03/27/2014   Procedure: left knee arthorscopy with medial chondraplasty of the medial femoral and patella, medial microfracture technique of medial femoral condyl;  Surgeon: RTobi Bastos MD;  Location: [facility];  Service: Orthopedics;  Laterality: Left;  . LEFT HEART CATHETERIZATION WITH CORONARY ANGIOGRAM Right 04/27/2013   Procedure: LEFT HEART CATHETERIZATION WITH CORONARY ANGIOGRAM;  Surgeon: DLeonie Man MD;  [facility];  Service: Cardiovascular;  Laterality: Right;  . LOWER EXTREMITY ANGIOGRAM Right 01/31/2016   Procedure: INTRAOP RIGHT LOWER EXTREMITY ANGIOGRAM;  Surgeon: JMal Misty MD;  [facility]  Service: Vascular;  Laterality: Right;  . ORIF WRIST FRACTURE Right 1983  . OVARIAN CYST REMOVAL Right   . PATCH ANGIOPLASTY Right 01/31/2016   Procedure: RIGHT COMMON FEMORAL AND PROFUNDA FEMORIS ENDARECTOMY WITH PATCH ANGIOPLASTY;  Surgeon: JMal Misty MD;  [facility]  Service: Vascular;  Laterality: Right;  . PATCH ANGIOPLASTY Right 01/09/2017   Procedure: PATCH ANGIOPLASTY RIGHT POPLITEAL ARTERY BYPASS GRAFT;  Surgeon: CElam Dutch MD;  [facility]  Service: Vascular;  Laterality: Right;  . PERIPHERAL VASCULAR CATHETERIZATION N/A 06/27/2015   Procedure: Lower Extremity Angiography;  Surgeon: JLorretta Harp MD;  [facility];  Service: Cardiovascular;  Laterality: N/A;  .  PERIPHERAL VASCULAR CATHETERIZATION Bilateral 01/29/2016   Procedure: Lower Extremity Angiography;  Surgeon: MWellington Hampshire MD;  [facility];  Service: Cardiovascular;  Laterality: Bilateral;  . PERIPHERAL VASCULAR CATHETERIZATION N/A 01/29/2016   Procedure: Abdominal Aortogram;  Surgeon: MWellington Hampshire MD;  [facility];  Service: Cardiovascular;  Laterality: N/A;  . REFRACTIVE SURGERY Bilateral    "6 in one eye; 7 in the other; to relieve pressure; not glaucoma" (01/28/2016)  . SFA Right 06/27/2015   overlapping Bahn covered stents  . TUBAL LIGATION  1983  . VEIN HARVEST Left 05/17/2017   Procedure: LEFT LEG GREATER SAPHENOUS VEIN HARVEST;  Surgeon: ERosetta Posner MD;  [facility]  Service: Vascular;  Laterality: Left;  .Marland KitchenVEIN REPAIR Right 01/31/2016   Procedure: RIGHT GREATER SAPHENOUS VEIN EXAMNED BUT NOT REMOVED;  Surgeon: JMal Misty MD;  [facility]  Service: Vascular;  Laterality: Right;    There were no vitals filed for this visit.  Subjective Assessment - 01/31/18 0944  Subjective  pt reports no falls over since last PT session, pt reports she continues to have no pain.  pt repors 29.5 steps from her home to her bus stop to get to PT.     Pertinent History  right TFA, arthritis, CAD,A-Fib, PVD, CVA, Glaucoma, migraines, HTN, NSTEMI,  knee pain/left torn meniscus,     Limitations  Lifting;Standing;Walking    Patient Stated Goals  To use prosthesis walking. Previously 2-4 miles.     Currently in Pain?  No/denies                      [facility] Adult PT Treatment/Exercise - 01/31/18 1029      Transfers   Transfers  Sit to Stand;Stand to Sit    Sit to Stand  6: Modified independent (Device/Increase time);With upper extremity assist;With armrests;From chair/3-in-1    Stand to Sit  With armrests;With upper extremity assist;To chair/3-in-1;6: Modified independent (Device/Increase time)      Ambulation/Gait   Ambulation/Gait   Yes    Ambulation/Gait Assistance  5: Supervision    Ambulation/Gait Assistance Details  Min A for gait with scanning tasks     Ambulation Distance (Feet)  213 Feet pt required rest after,     Assistive device  Straight cane;Crutches quad tip    Ambulation Surface  Indoor;Level    Stairs  Yes    Stairs Assistance  5: Supervision    Stair Management Technique  One rail Right;Step to pattern;With cane    Number of Stairs  4    Height of Stairs  6    Ramp  4: Min assist cane quad tip & HHA with prosthesis    Ramp Details (indicate cue type and reason)  verbal cues on posture & wt shift    Curb  4: Min assist HHA with cane quad tip & prosthesis    Curb Details (indicate cue type and reason)  tactile cues on balance reaction      High Level Balance   High Level Balance Activities  Head turns Horizontal, Vretical and varied 268f.     High Level Balance Comments  Horizontal, Vretical and varied 2079f  in hallway  with single standing break      Self-Care   Self-Care  ADL's    ADL's  Laundry( place in washer and pull out of washer) close doors, [employer]bra[employer] cooking items form low and high cabinet without LOB and good contorl              PT Education - 01/31/18 1037    Education Details  progress with STGs    Person(s) Educated  Patient    Methods  Explanation    Comprehension  Verbalized understanding       PT Short Term Goals - 01/31/18 1045      PT SHORT TERM GOAL #1   Title  Patient verbalizes & demonstrates proper donning with correct rotation.  (All STGs Target Date: 02/03/2018)    Baseline  MET 01/31/2018    Time  4    Period  Weeks    Status  Achieved      PT SHORT TERM GOAL #2   Title  Patient able to reach 5" & to floor with cane support with supervision.     Baseline  MET 01/31/2018    Time  4    Period  Weeks    Status  Achieved    Target Date  02/03/18  PT SHORT TERM GOAL #3   Title  Patient demonstrates standing ADLs like cooking in  kitchen or laundry with prosthesis with supervision.     Baseline  MET 01/31/2018    Time  4    Period  Weeks    Status  Achieved    Target Date  02/03/18      PT SHORT TERM GOAL #4   Title  Patient ambulates 200' with cane & prosthesis with supervision.     Baseline  MET 01/31/2018-- required extra time, slow gait speed with SBSC     Time  4    Period  Weeks    Status  Achieved    Target Date  02/03/18      PT SHORT TERM GOAL #5   Title  Patient negotiates stairs (1 rails), ramps & curbs with cane & prosthesis with minimal assist    Baseline  MET 01/31/2018 MinA with SBSC on curb/ ramps    Time  4    Period  Weeks    Status  Achieved    Target Date  02/03/18        PT Long Term Goals - 01/05/18 1135      PT LONG TERM GOAL #1   Title  Patient verbalizes & demonstrates proper prosthetic care to enable safe use of prosthesis. (All LTGs Target Date: 03/04/2018)    Time  4    Period  Months    Status  On-going    Target Date  03/04/18      PT LONG TERM GOAL #2   Title  Patient tolerates wear of prosthesis >90% of awake hours without skin issues or limb pain to enable function during her day.     Time  4    Period  Months    Status  On-going    Target Date  03/04/18      PT LONG TERM GOAL #3   Title  Patient ambulates household around furniture carrying items with cane or less & prosthesis modified independent.     Time  4    Period  Months    Status  On-going    Target Date  03/04/18      PT LONG TERM GOAL #4   Title  Berg Balance >/= 45/56 to indicate lower fall risk.     Time  4    Period  Months    Status  On-going    Target Date  03/04/18      PT LONG TERM GOAL #5   Title  Patient ambulates >1000' with LRAD & prosthesis outdoors including grass, ramps & curbs modified independent.     Time  4    Period  Months    Status  On-going    Target Date  03/04/18      PT LONG TERM GOAL #6   Title  Patient performs standing ADLs including cooking, laundry, sweeping,  etc with prosthesis & LRAD modified indepedent.     Time  4    Period  Months    Status  On-going    Target Date  03/04/18            Plan - 01/31/18 1038    Clinical Impression Statement  Pt continues to progress during PT sessions. Pt has met all her STGs but continues to require extra time during ambulation with Straight cane quad tip. Pt also requires frequent rest breaks during ambulation 2/2 fatigue but is able to ambulate safely  indoors maintaining a light conversation. pt will continue to benefit from skilled PT to progress towards meeting her LTGs    History and Personal Factors relevant to plan of care:  R TFA, Arthritis,CAD    Clinical Presentation  Evolving    Rehab Potential  Good    Clinical Impairments Affecting Rehab Potential  right TFA, arthritis, CAD,A-Fib, PVD, CVA, Glaucoma, migraines, HTN, NSTEMI,  knee pain/left torn meniscus,     PT Frequency  2x / week    PT Duration  8 weeks    PT Treatment/Interventions  ADLs/Self Care Home Management;DME Instruction;Gait training;Stair training;Functional mobility training;Therapeutic activities;Therapeutic exercise;Balance training;Neuromuscular re-education;Patient/family education;Prosthetic Training    PT Next Visit Plan  Pt to continue to work on gait  with additional tasks( scanning, navigate over obstacles,  change in pace) as well as ambulation over uneven surfaces/ curbs and ramps    Consulted and Agree with Plan of Care  Patient       Patient will benefit from skilled therapeutic intervention in order to improve the following deficits and impairments:  Abnormal gait, Decreased activity tolerance, Decreased balance, Decreased endurance, Decreased knowledge of use of DME, Decreased mobility, Decreased strength, Prosthetic Dependency, Postural dysfunction  Visit Diagnosis: Other abnormalities of gait and mobility  Muscle weakness (generalized)  Repeated falls  Unsteadiness on feet     Problem List Patient  Active Problem List   Diagnosis Date Noted  . Phantom limb pain ([place])   . Hypertensive crisis   . Induration of skin   . Abnormality of gait   . Hypoalbuminemia due to protein-calorie malnutrition ([place])   . Amputee, above knee, right ([place]) 05/25/2017  . Unilateral AKA, right ([place])   . Elevated troponin   . PVD (peripheral vascular disease) ([place])   . Benign essential HTN   . Acute blood loss anemia   . Ischemia of right lower extremity 05/17/2017  . Thrombosis of right femoral artery ([place]) 01/09/2017  . Ischemia of extremity 01/09/2017  . History of colonic polyps 07/30/2016  . Essential hypertension 01/08/2016  . Polycythemia vera ([place]) 08/20/2015  . Leukocytosis 07/15/2015  . S/P arterial stent right SFA overlapping Bahn covered stents 06/27/2015 06/28/2015  . Claudication ([place]) 06/27/2015  . Carotid artery disease ([place]) 08/24/2014  . Osteoarthritis of left knee 03/27/2014  . External hemorrhoids 06/06/2013  . PAF (paroxysmal atrial fibrillation), maintaing SR 05/11/2013  . Chronic anticoagulation, new- eliquis 05/11/2013  . CAD (coronary artery disease), per cath 04/27/13, non obstructive disease 20-30% LM; LAD 30%; 50-60% OM2; RCA 40%; EF 65-70% 05/11/2013  . Mitral regurgitation,2+ by cath 05/11/2013  . Atrial fibrillation with RVR, hx of PAF previously 04/27/2013  . PAD (peripheral artery disease), Lt carotid 50-70%: , Hx stent Lt exteral iliac & RSFA stent, known occl. LSFA  04/27/2013  . Hyperlipidemia 04/27/2013  . History of CVA (cerebrovascular accident) 04/27/2013  . NSTEMI (non-ST elevated myocardial infarction) ([place]) 04/27/2013    Waunita Schooner, SPT 01/31/2018, 12:35 PM  Jamey Reas  PT, DPT 01/31/2018, 2:52 PM  [facility] [address] [place], [place], [address] Phone: [phone]   Fax:  [phone]  Name: [name] MRN: [id number] Date of Birth: 09-Feb-1951

## 2018-02-02 ENCOUNTER — Ambulatory Visit: Payer: Medicare Other | Admitting: Physical Therapy

## 2018-02-02 ENCOUNTER — Encounter: Payer: Self-pay | Admitting: Cardiology

## 2018-02-02 DIAGNOSIS — R2689 Other abnormalities of gait and mobility: Secondary | ICD-10-CM

## 2018-02-02 DIAGNOSIS — R2681 Unsteadiness on feet: Secondary | ICD-10-CM

## 2018-02-02 DIAGNOSIS — M6281 Muscle weakness (generalized): Secondary | ICD-10-CM

## 2018-02-02 DIAGNOSIS — R296 Repeated falls: Secondary | ICD-10-CM

## 2018-02-02 NOTE — Therapy (Signed)
[facility] [address] [place], [place], [address] Phone: [phone]   Fax:  [phone]  Physical Therapy Treatment  Patient Details  Name: [name] MRN: [id number] Date of Birth: 1951/08/18 Referring Provider: Jamse Arn, MD   Encounter Date: 02/02/2018  PT End of Session - 02/02/18 1124    Visit Number  21    Number of Visits  29    Date for PT Re-Evaluation  03/04/18    Authorization Type  Medicare progress note every 39QZ visit recert sent visit 13    PT Start Time  0935    PT Stop Time  1015    PT Time Calculation (min)  40 min    Equipment Utilized During Treatment  Gait belt    Activity Tolerance  Patient tolerated treatment well;Patient limited by fatigue    Behavior During Therapy  [facility] for tasks assessed/performed       Past Medical History:  Diagnosis Date  . Arthritis    "fingers, some in my knees" (01/28/2016)  . CAD (coronary artery disease), per cath 04/27/13, non obstructive disease, EF 65-70% 05/11/2013  . Carotid disease, bilateral ([place]) 09/2013   Lt carotid 50-69% stentosis, less on rt  . Dysrhythmia   . Fatty liver   . GERD (gastroesophageal reflux disease)   . Glaucoma   . H/O cardiovascular stress test 12/27/2009   negative for ischemia  . Headache    "occasional since eye pressure regulated" (01/28/2016)  . Hyperlipidemia   . Hypertension   . Leukocytosis 07/15/2015  . Migraine    "stopped w/laser holes to relieve pressure in my eyes; had them 3-4 times/wk before taht" (01/28/2016)  . Mitral regurgitation,2+ by cath 05/11/2013  . NSTEMI (non-ST elevated myocardial infarction) ([place]) 04/27/2013  . Pain    BOTH KNEES - PT HAS TORN MENISCUS LEFT KNEE  . Paroxysmal atrial fibrillation (HCC)    hx of a fib on ASA  AND ELIQUIS   . Polycythemia vera ([place]) 08/20/2015   JAK-2 positive 07/19/15  . PVD (peripheral vascular disease) with claudication ([place]) 07/2006   previous L ext iliac stent  and rt SFA stent, known occluded Lt SFA  . S/P cardiac cath 04/27/13   NON OBSTRUCTIVE DISEASE, 2+ mr, ef 65-70%  . Statin intolerance   . Stroke ([place]) 2006   SWELLING ALL OVER AND RT SIDE OF FACE DRAWN AND SPEECH SLURRED AND NUMBNESS ON RIGHT SIDE-- ALL RESOLVED    Past Surgical History:  Procedure Laterality Date  . AMPUTATION Right 05/21/2017   Procedure: AMPUTATION ABOVE KNEE;  Surgeon: Rosetta Posner, MD;  Location: [facility];  Service: Vascular;  Laterality: Right;  . CARDIAC CATHETERIZATION  04/27/13   non occlusive disease with mild to mod. calcified lesions in ostial LM and proximal LAD and moderate prox. RC AND DISTAL AV GROOVE LCX, ef  . CATARACT EXTRACTION W/PHACO Right 03/21/2015   Procedure: CATARACT EXTRACTION PHACO AND INTRAOCULAR LENS PLACEMENT RIGHT EYE;  Surgeon: Baruch Goldmann, MD;  [facility];  Service: Ophthalmology;  Laterality: Right;  CDE:5.10  . CATARACT EXTRACTION W/PHACO Left 05/16/2015   Procedure: CATARACT EXTRACTION PHACO AND INTRAOCULAR LENS PLACEMENT (IOC);  Surgeon: Baruch Goldmann, MD;  [facility]: [facility];  Service: Ophthalmology;  Laterality: Left;  CDE 5.57  . E[place]  . COLONOSCOPY N/A 09/11/2016   Procedure: COLONOSCOPY;  Surgeon: Rogene [place], MD;  [facility];  Service: Endoscopy;  Laterality: N/A;  730-moved  to 1:00 Ann notified pt  . EMBOLECTOMY Right 02/01/2016   Procedure: Thrombectomy of Right Femoral-Popliteal Bypass Graft; Endarterectomy of Right Below Knee Popliteal Artery and Tibial Peroneal Trunk with Bovine Pericardium Patch Angioplasty.;  Surgeon: Angelia Mould, MD;  Location: [facility];  Service: Vascular;  Laterality: Right;  . FEMORAL-POPLITEAL BYPASS GRAFT Right 01/31/2016   Procedure: BYPASS GRAFT RIGHT COMMON  FEMORALTO BELOW KNEE POPLITEAL ARTERY BYPASS GRAFT USING 6MM PROPATEN GORTEX GRAFT;  Surgeon: Mal Misty, MD;  Location: [facility];  Service: Vascular;  Laterality: Right;  . FEMORAL-POPLITEAL BYPASS GRAFT  Right 01/09/2017   Procedure: THROMBECTOMY OF RIGHT FEMORAL-POPLITEAL  ARTERY BYPASS GRAFT; THROMBECTOMY RIGHT  TIBIAL VESSELS;  Surgeon: [name], MD;  Location: [facility];  Service: Vascular;  Laterality: Right;  . FEMORAL-POPLITEAL BYPASS GRAFT Right 05/17/2017   Procedure: RIGHT FEMORAL-TIBIAL PERONEAL TRUNK ARTERY BYPASS GRAFT USING 41mX80cm PROPATEN GRAFT WITH REMOVABLE RING;  Surgeon: ERosetta Posner MD;  [place]  Service: Vascular;  Laterality: Right;  . FRACTURE SURGERY    . ILIAC ARTERY STENT  07/2006   external iliac and Rt SFA stent 07/2006 AND THE LEFT WAS IN 2006  . KNEE ARTHROSCOPY WITH MEDIAL MENISECTOMY Left 03/27/2014   Procedure: left knee arthorscopy with medial chondraplasty of the medial femoral and patella, medial microfracture technique of medial femoral condyl;  Surgeon: RTobi Bastos MD;  [facility];  Service: Orthopedics;  Laterality: Left;  . LEFT HEART CATHETERIZATION WITH CORONARY ANGIOGRAM Right 04/27/2013   Procedure: LEFT HEART CATHETERIZATION WITH CORONARY ANGIOGRAM;  Surgeon: DLeonie Man MD;  [facility];  Service: Cardiovascular;  Laterality: Right;  . LOWER EXTREMITY ANGIOGRAM Right 01/31/2016   Procedure: INTRAOP RIGHT LOWER EXTREMITY ANGIOGRAM;  Surgeon: JMal Misty MD;  [place] [facility]  Service: Vascular;  Laterality: Right;  . ORIF WRIST FRACTURE Right 1983  . OVARIAN CYST REMOVAL Right   . PATCH ANGIOPLASTY Right 01/31/2016   Procedure: RIGHT COMMON FEMORAL AND PROFUNDA FEMORIS ENDARECTOMY WITH PATCH ANGIOPLASTY;  Surgeon: JMal Misty MD;  [place] [facility]  Service: Vascular;  Laterality: Right;  . PATCH ANGIOPLASTY Right 01/09/2017   Procedure: PATCH ANGIOPLASTY RIGHT POPLITEAL ARTERY BYPASS GRAFT;  Surgeon: CElam Dutch MD;  [place] [facility]  Service: Vascular;  Laterality: Right;  . PERIPHERAL VASCULAR CATHETERIZATION N/A 06/27/2015   Procedure: Lower Extremity Angiography;  Surgeon: JLorretta Harp MD;   [facility];  Service: Cardiovascular;  Laterality: N/A;  . PERIPHERAL VASCULAR CATHETERIZATION Bilateral 01/29/2016   Procedure: Lower Extremity Angiography;  Surgeon: MWellington Hampshire MD;  [facility];  Service: Cardiovascular;  Laterality: Bilateral;  . PERIPHERAL VASCULAR CATHETERIZATION N/A 01/29/2016   Procedure: Abdominal Aortogram;  Surgeon: MWellington Hampshire MD;  [facility];  Service: Cardiovascular;  Laterality: N/A;  . REFRACTIVE SURGERY Bilateral    "6 in one eye; 7 in the other; to relieve pressure; not glaucoma" (01/28/2016)  . SFA Right 06/27/2015   overlapping Bahn covered stents  . TUBAL LIGATION  1983  . VEIN HARVEST Left 05/17/2017   Procedure: LEFT LEG GREATER SAPHENOUS VEIN HARVEST;  Surgeon: ERosetta Posner MD;  [place] [facility]  Service: Vascular;  Laterality: Left;  .Marland KitchenVEIN REPAIR Right 01/31/2016   Procedure: RIGHT GREATER SAPHENOUS VEIN EXAMNED BUT NOT REMOVED;  Surgeon: JMal Misty MD;  [place] [facility]  Service: Vascular;  Laterality: Right;    There were no vitals  filed for this visit.  Subjective Assessment - 02/02/18 0935    Subjective  Pt reports no fall since last pt session. She reports that she has not had any difficulty with doing her laundry  at home.  Pt reports increased confidence when ambulating short distances around her home with single crutch.    Pertinent History  right TFA, arthritis, CAD,A-Fib, PVD, CVA, Glaucoma, migraines, HTN, NSTEMI,  knee pain/left torn meniscus,     Limitations  Lifting;Standing;Walking    Patient Stated Goals  To use prosthesis walking. Previously 2-4 miles.     Currently in Pain?  No/denies          Prosthetics Assessment - 02/02/18 0935      Prosthetics   Current prosthetic wear tolerance (days/week)   daily    Current prosthetic wear tolerance (#hours/day)   she reports most of awake hours    Edema  none    Residual limb condition   intact per pt report                  [facility] Adult PT Treatment/Exercise - 02/02/18 0935      Transfers   Transfers  Sit to Stand;Stand to Sit    Sit to Stand  5: Supervision;6: Modified independent (Device/Increase time);With upper extremity assist;With armrests;From chair/3-in-1 Supervision from chair without arm rest.    Stand to Sit  5: Supervision;6: Modified independent (Device/Increase time);With upper extremity assist;With armrests;To chair/3-in-1 Supervision with chair without arm rest.       Ambulation/Gait   Ambulation/Gait  Yes    Ambulation/Gait Assistance  5: Supervision;4: Min guard    Ambulation/Gait Assistance Details  Intermittent UE /hand hold assist for balance    Ambulation Distance (Feet)  200 Feet 200' X 2 pt required rest break after    Assistive device  Straight cane;Crutches;1 person hand held assist;Prosthesis enter/exit with crutches, session worked with cane w/quad ti    Gait Pattern  Step-through pattern;Narrow base of support;Decreased stride length;Decreased weight shift to right    Ambulation Surface  Level;Indoor;Outdoor;Paved    Door Management  5: Supervision 4 heavy doors, increased time to safely clear    Ramp  Other (comment) Min Guard    Ramp Details (indicate cue type and reason)  Demo, Verbal cues for weight shift and foot and prothesis placement     Curb  4: Min assist    Curb Details (indicate cue type and reason)  demo, Verbal and tactile cues for foot and prosthesis placement as well as weight shift      High Level Balance   High Level Balance Activities  Head turns    High Level Balance Comments  Min A. Horizontal and Vertical head turns on Ramp in normal stance and in small step stance facing up and down Ramp        Prosthetics   Prosthetic Care Comments   need to tighten suspension strap with pistoning which distal limb tenderness may indicate    Education Provided  Other (comment) see prosthetic care comments    Person(s) Educated  Patient    Education  Method  Explanation;Demonstration;Verbal cues    Education Method  Verbalized understanding          Balance Exercises - 02/02/18 0935      Balance Exercises: Standing   Standing Eyes Opened  Wide ([place]);Solid surface;Other (comment) standing on ramp uphill & downhill w/cane support,    Step Ups  Forward;4 inch;UE support 1 cane LUE, RUE  progressed from counter to wall support        PT Education - 02/02/18 0952    Education provided  Yes    Education Details  "Socket pistoning" how it can result in pain on residual limb and how to adjust/tighten straps to reduce it. Curb training( prosthesis and foot placement when stepping up and down on 1/2 cinder block. sitting Mechanics with chairs without arms rests how to bend and reach back for seat without losing balance( bend till hand to seat level reach back for seat and control decent with UE support).    Person(s) Educated  Patient    Methods  Explanation;Demonstration;Tactile cues;Verbal cues    Comprehension  Verbalized understanding;Returned demonstration;Verbal cues required;Need further instruction       PT Short Term Goals - 01/31/18 1045      PT SHORT TERM GOAL #1   Title  Patient verbalizes & demonstrates proper donning with correct rotation.  (All STGs Target Date: 02/03/2018)    Baseline  MET 01/31/2018    Time  4    Period  Weeks    Status  Achieved      PT SHORT TERM GOAL #2   Title  Patient able to reach 5" & to floor with cane support with supervision.     Baseline  MET 01/31/2018    Time  4    Period  Weeks    Status  Achieved    Target Date  02/03/18      PT SHORT TERM GOAL #3   Title  Patient demonstrates standing ADLs like cooking in kitchen or laundry with prosthesis with supervision.     Baseline  MET 01/31/2018    Time  4    Period  Weeks    Status  Achieved    Target Date  02/03/18      PT SHORT TERM GOAL #4   Title  Patient ambulates 200' with cane & prosthesis with supervision.     Baseline  MET  01/31/2018-- required extra time, slow gait speed with SBSC     Time  4    Period  Weeks    Status  Achieved    Target Date  02/03/18      PT SHORT TERM GOAL #5   Title  Patient negotiates stairs (1 rails), ramps & curbs with cane & prosthesis with minimal assist    Baseline  MET 01/31/2018 MinA with SBSC on curb/ ramps    Time  4    Period  Weeks    Status  Achieved    Target Date  02/03/18        PT Long Term Goals - 01/05/18 1135      PT LONG TERM GOAL #1   Title  Patient verbalizes & demonstrates proper prosthetic care to enable safe use of prosthesis. (All LTGs Target Date: 03/04/2018)    Time  4    Period  Months    Status  On-going    Target Date  03/04/18      PT LONG TERM GOAL #2   Title  Patient tolerates wear of prosthesis >90% of awake hours without skin issues or limb pain to enable function during her day.     Time  4    Period  Months    Status  On-going    Target Date  03/04/18      PT LONG TERM GOAL #3   Title  Patient ambulates household around furniture carrying items  with cane or less & prosthesis modified independent.     Time  4    Period  Months    Status  On-going    Target Date  03/04/18      PT LONG TERM GOAL #4   Title  Berg Balance >/= 45/56 to indicate lower fall risk.     Time  4    Period  Months    Status  On-going    Target Date  03/04/18      PT LONG TERM GOAL #5   Title  Patient ambulates >1000' with LRAD & prosthesis outdoors including grass, ramps & curbs modified independent.     Time  4    Period  Months    Status  On-going    Target Date  03/04/18      PT LONG TERM GOAL #6   Title  Patient performs standing ADLs including cooking, laundry, sweeping, etc with prosthesis & LRAD modified indepedent.     Time  4    Period  Months    Status  On-going    Target Date  03/04/18            Plan - 02/02/18 1124    Clinical Impression Statement  Pt continues to tolerate new therapy interventions to improve her ability to  negotiate curbs and ramps. Pt continues to have apprehension about curb training and continues to display distrust in function of prosthesis. Today pt required multiple rest breaks during session and was displayed difficulty with gait tasks ( outdoor ambulation) maintaining smooth progression and light conversation ( pt often stopped to talk ).  Pt was educated about Pistoning in her socket and how to tighten straps to prevent it. Pt was also instructed to practice curb stepping at home with cane and wall support.  Pt will continue to benefit from skilled PT to progress towards meeting her LTGs.     Rehab Potential  Good    Clinical Impairments Affecting Rehab Potential  right TFA, arthritis, CAD,A-Fib, PVD, CVA, Glaucoma, migraines, HTN, NSTEMI,  knee pain/left torn meniscus,     PT Frequency  2x / week    PT Duration  8 weeks    PT Treatment/Interventions  ADLs/Self Care Home Management;DME Instruction;Gait training;Stair training;Functional mobility training;Therapeutic activities;Therapeutic exercise;Balance training;Neuromuscular re-education;Patient/family education;Prosthetic Training    PT Next Visit Plan  Pt to continues to work on gait training (crubs, ramps and obstical navigation) in order to proress towards her LTGs.     Consulted and Agree with Plan of Care  Patient       Patient will benefit from skilled therapeutic intervention in order to improve the following deficits and impairments:  Abnormal gait, Decreased activity tolerance, Decreased balance, Decreased endurance, Decreased knowledge of use of DME, Decreased mobility, Decreased strength, Prosthetic Dependency, Postural dysfunction  Visit Diagnosis: Other abnormalities of gait and mobility  Muscle weakness (generalized)  Unsteadiness on feet  Repeated falls     Problem List Patient Active Problem List   Diagnosis Date Noted  . Phantom limb pain ([place])   . Hypertensive crisis   . Induration of skin   . Abnormality  of gait   . Hypoalbuminemia due to protein-calorie malnutrition ([place])   . Amputee, above knee, right ([place]) 05/25/2017  . Unilateral AKA, right ([place])   . Elevated troponin   . PVD (peripheral vascular disease) ([place])   . Benign essential HTN   . Acute blood loss anemia   . Ischemia of right  lower extremity 05/17/2017  . Thrombosis of right femoral artery ([place]) 01/09/2017  . Ischemia of extremity 01/09/2017  . History of colonic polyps 07/30/2016  . Essential hypertension 01/08/2016  . Polycythemia vera ([place]) 08/20/2015  . Leukocytosis 07/15/2015  . S/P arterial stent right SFA overlapping Bahn covered stents 06/27/2015 06/28/2015  . Claudication ([place]) 06/27/2015  . Carotid artery disease ([place]) 08/24/2014  . Osteoarthritis of left knee 03/27/2014  . External hemorrhoids 06/06/2013  . PAF (paroxysmal atrial fibrillation), maintaing SR 05/11/2013  . Chronic anticoagulation, new- eliquis 05/11/2013  . CAD (coronary artery disease), per cath 04/27/13, non obstructive disease 20-30% LM; LAD 30%; 50-60% OM2; RCA 40%; EF 65-70% 05/11/2013  . Mitral regurgitation,2+ by cath 05/11/2013  . Atrial fibrillation with RVR, hx of PAF previously 04/27/2013  . PAD (peripheral artery disease), Lt carotid 50-70%: , Hx stent Lt exteral iliac & RSFA stent, known occl. LSFA  04/27/2013  . Hyperlipidemia 04/27/2013  . History of CVA (cerebrovascular accident) 04/27/2013  . NSTEMI (non-ST elevated myocardial infarction) ([place]) 04/27/2013   Waunita Schooner, SPT 02/02/2018, 11:31 AM  Jamey Reas PT, DPT 02/02/2018, 7:46 PM  [facility] [address] [place], [place], [address] Phone: [phone]   Fax:  [phone]  Name: [name] MRN: [id number] Date of Birth: 08/06/1951

## 2018-02-03 ENCOUNTER — Encounter: Payer: Medicare Other | Attending: Physical Medicine & Rehabilitation | Admitting: Physical Medicine & Rehabilitation

## 2018-02-03 ENCOUNTER — Encounter: Payer: Self-pay | Admitting: Physical Medicine & Rehabilitation

## 2018-02-03 ENCOUNTER — Ambulatory Visit (HOSPITAL_COMMUNITY)
Admission: RE | Admit: 2018-02-03 | Discharge: 2018-02-03 | Disposition: A | Discharge status: Home / Self Care | Payer: Medicare Other | Source: Ambulatory Visit | Attending: Oncology | Admitting: Oncology

## 2018-02-03 VITALS — BP 171/55 | HR 72

## 2018-02-03 DIAGNOSIS — H409 Unspecified glaucoma: Secondary | ICD-10-CM | POA: Diagnosis not present

## 2018-02-03 DIAGNOSIS — I480 Paroxysmal atrial fibrillation: Secondary | ICD-10-CM | POA: Diagnosis not present

## 2018-02-03 DIAGNOSIS — D45 Polycythemia vera: Secondary | ICD-10-CM | POA: Diagnosis not present

## 2018-02-03 DIAGNOSIS — I252 Old myocardial infarction: Secondary | ICD-10-CM | POA: Diagnosis not present

## 2018-02-03 DIAGNOSIS — Z89611 Acquired absence of right leg above knee: Secondary | ICD-10-CM | POA: Diagnosis not present

## 2018-02-03 DIAGNOSIS — Z9181 History of falling: Secondary | ICD-10-CM | POA: Insufficient documentation

## 2018-02-03 DIAGNOSIS — E785 Hyperlipidemia, unspecified: Secondary | ICD-10-CM | POA: Insufficient documentation

## 2018-02-03 DIAGNOSIS — I2510 Atherosclerotic heart disease of native coronary artery without angina pectoris: Secondary | ICD-10-CM | POA: Insufficient documentation

## 2018-02-03 DIAGNOSIS — Z8673 Personal history of transient ischemic attack (TIA), and cerebral infarction without residual deficits: Secondary | ICD-10-CM | POA: Insufficient documentation

## 2018-02-03 DIAGNOSIS — M1711 Unilateral primary osteoarthritis, right knee: Secondary | ICD-10-CM | POA: Diagnosis not present

## 2018-02-03 DIAGNOSIS — I739 Peripheral vascular disease, unspecified: Secondary | ICD-10-CM | POA: Diagnosis not present

## 2018-02-03 DIAGNOSIS — R269 Unspecified abnormalities of gait and mobility: Secondary | ICD-10-CM | POA: Diagnosis not present

## 2018-02-03 DIAGNOSIS — G546 Phantom limb syndrome with pain: Secondary | ICD-10-CM | POA: Insufficient documentation

## 2018-02-03 DIAGNOSIS — I10 Essential (primary) hypertension: Secondary | ICD-10-CM | POA: Diagnosis not present

## 2018-02-03 DIAGNOSIS — K219 Gastro-esophageal reflux disease without esophagitis: Secondary | ICD-10-CM | POA: Diagnosis not present

## 2018-02-03 DIAGNOSIS — Z87891 Personal history of nicotine dependence: Secondary | ICD-10-CM | POA: Insufficient documentation

## 2018-02-03 LAB — POCT HEMOGLOBIN-HEMACUE: HEMOGLOBIN: 12.6 g/dL (ref 12.0–15.0)

## 2018-02-03 MED ORDER — GABAPENTIN 100 MG PO CAPS: 200.0000 mg | ORAL_CAPSULE | Freq: Three times a day (TID) | ORAL | 2 refills | Status: DC

## 2018-02-03 MED ORDER — DICLOFENAC SODIUM 1 % TD GEL: 2.0000 g | Freq: Four times a day (QID) | TRANSDERMAL | 1 refills | Status: DC

## 2018-02-03 NOTE — Progress Notes (Signed)
Subjective:    Patient ID: [name], female    DOB: 03/14/1951, 67 y.o.   MRN: [id number]  HPI  67 year old right-handed female, history of polycythemia vera, diagnosed July 2016, CAD, hypertension, atrial fibrillation presents for follow up for right AKA.  Last clinic visit 11/03/17.  Since that time, pt states [name] has decreased edema since [name] was fitted and now her leg is loose.  [name] is in therapies.  Denies falls.  [name] is currently using crutches transitioning to cane.  [name] is finding good relief with Gabapentin.   Pain Inventory Average Pain 4 Pain Right Now 2 My pain is sharp, dull and stabbing  In the last 24 hours, has pain interfered with the following? General activity 0 Relation with others 0 Enjoyment of life 0 What TIME of day is your pain at its worst? Evening and Night, night Sleep (in general) Good  Pain is worse with: walking Pain improves with: rest and medication Relief from Meds: 10  Mobility walk with assistance use a walker how many minutes can you walk? 30-45 ability to climb steps?  yes do you drive?  no use a wheelchair transfers alone Do you have any goals in this area?  no  Function disabled: date disabled 2006 Do you have any goals in this area?  no  Neuro/Psych depression  Prior Studies Any changes since last visit?  no  Physicians involved in your care Any changes since last visit?  no   Family History  Problem Relation Age of Onset  . CAD Mother   . Lung cancer Mother   . Heart attack Mother   . CAD Father   . Heart disease Father   . Heart attack Father 83       died in hes 64's with heart disease  . Healthy Sister   . Liver cancer Brother   . Healthy Sister   . CAD Brother        has had CABG  . CAD Brother    Social History   Socioeconomic History  . Marital status: Widowed    Spouse name: None  . Number of children: 2  . Years of education: 67  . Highest education level: None  Social Needs  . Financial  resource strain: None  . Food insecurity - worry: None  . Food insecurity - inability: None  . Transportation needs - medical: None  . Transportation needs - non-medical: None  Occupational History  . Occupation: CNA  Tobacco Use  . Smoking status: Former Smoker    Packs/day: 1.00    Years: 45.00    Pack years: 45.00    Types: Cigarettes    Last attempt to quit: 11/11/2012    Years since quitting: 5.2  . Smokeless tobacco: Never Used  . Tobacco comment: quit 4 years ago  Substance and Sexual Activity  . Alcohol use: No    Alcohol/week: 0.0 oz  . Drug use: No  . Sexual activity: Not Currently    Birth control/protection: None  Other Topics Concern  . None  Social History Narrative  . None   Past Surgical History:  Procedure Laterality Date  . AMPUTATION Right 05/21/2017   Procedure: AMPUTATION ABOVE KNEE;  Surgeon: Rosetta Posner, MD;  Location: [place];  Service: Vascular;  Laterality: Right;  . CARDIAC CATHETERIZATION  04/27/13   non occlusive disease with mild to mod. calcified lesions in ostial LM and proximal LAD and moderate prox. RC AND DISTAL AV  GROOVE LCX, ef  . CATARACT EXTRACTION W/PHACO Right 03/21/2015   Procedure: CATARACT EXTRACTION PHACO AND INTRAOCULAR LENS PLACEMENT RIGHT EYE;  Surgeon: Baruch Goldmann, MD;  Location: [facility];  Service: Ophthalmology;  Laterality: Right;  CDE:5.10  . CATARACT EXTRACTION W/PHACO Left 05/16/2015   Procedure: CATARACT EXTRACTION PHACO AND INTRAOCULAR LENS PLACEMENT (IOC);  Surgeon: Baruch Goldmann, MD;  Location: [facility];  Service: Ophthalmology;  Laterality: Left;  CDE 5.57  . [place]  . COLONOSCOPY N/A 09/11/2016   Procedure: COLONOSCOPY;  Surgeon: Rogene Houston, MD;  [facility];  Service: Endoscopy;  Laterality: N/A;  730-moved to 1:00 Ann notified pt  . EMBOLECTOMY Right 02/01/2016   Procedure: Thrombectomy of Right Femoral-Popliteal Bypass Graft; Endarterectomy of Right Below Knee Popliteal Artery and Tibial  Peroneal Trunk with Bovine Pericardium Patch Angioplasty.;  Surgeon: Angelia Mould, MD;  Location: [place];  Service: Vascular;  Laterality: Right;  . FEMORAL-POPLITEAL BYPASS GRAFT Right 01/31/2016   Procedure: BYPASS GRAFT RIGHT COMMON  FEMORALTO BELOW KNEE POPLITEAL ARTERY BYPASS GRAFT USING 6MM PROPATEN GORTEX GRAFT;  Surgeon: Mal Misty, MD;  Location: [place];  Service: Vascular;  Laterality: Right;  . FEMORAL-POPLITEAL BYPASS GRAFT Right 01/09/2017   Procedure: THROMBECTOMY OF RIGHT FEMORAL-POPLITEAL  ARTERY BYPASS GRAFT; THROMBECTOMY RIGHT  TIBIAL VESSELS;  Surgeon: [name], MD;  Location: [place];  Service: Vascular;  Laterality: Right;  . FEMORAL-POPLITEAL BYPASS GRAFT Right 05/17/2017   Procedure: RIGHT FEMORAL-TIBIAL PERONEAL TRUNK ARTERY BYPASS GRAFT USING 28mmX80cm PROPATEN GRAFT WITH REMOVABLE RING;  Surgeon: Rosetta Posner, MD;  Location: [place];  Service: Vascular;  Laterality: Right;  . FRACTURE SURGERY    . ILIAC ARTERY STENT  07/2006   external iliac and Rt SFA stent 07/2006 AND THE LEFT WAS IN 2006  . KNEE ARTHROSCOPY WITH MEDIAL MENISECTOMY Left 03/27/2014   Procedure: left knee arthorscopy with medial chondraplasty of the medial femoral and patella, medial microfracture technique of medial femoral condyl;  Surgeon: Tobi Bastos, MD;  Location: [facility];  Service: Orthopedics;  Laterality: Left;  . LEFT HEART CATHETERIZATION WITH CORONARY ANGIOGRAM Right 04/27/2013   Procedure: LEFT HEART CATHETERIZATION WITH CORONARY ANGIOGRAM;  Surgeon: Leonie Man, MD;  Location: [facility];  Service: Cardiovascular;  Laterality: Right;  . LOWER EXTREMITY ANGIOGRAM Right 01/31/2016   Procedure: INTRAOP RIGHT LOWER EXTREMITY ANGIOGRAM;  Surgeon: Mal Misty, MD;  Location: [place];  Service: Vascular;  Laterality: Right;  . ORIF WRIST FRACTURE Right 1983  . OVARIAN CYST REMOVAL Right   . PATCH ANGIOPLASTY Right 01/31/2016   Procedure: RIGHT COMMON FEMORAL AND PROFUNDA FEMORIS  ENDARECTOMY WITH PATCH ANGIOPLASTY;  Surgeon: Mal Misty, MD;  Location: [place];  Service: Vascular;  Laterality: Right;  . PATCH ANGIOPLASTY Right 01/09/2017   Procedure: PATCH ANGIOPLASTY RIGHT POPLITEAL ARTERY BYPASS GRAFT;  Surgeon: [name], MD;  Location: [place];  Service: Vascular;  Laterality: Right;  . PERIPHERAL VASCULAR CATHETERIZATION N/A 06/27/2015   Procedure: Lower Extremity Angiography;  Surgeon: Lorretta Harp, MD;  Location: [place] [facility];  Service: Cardiovascular;  Laterality: N/A;  . PERIPHERAL VASCULAR CATHETERIZATION Bilateral 01/29/2016   Procedure: Lower Extremity Angiography;  Surgeon: Wellington Hampshire, MD;  Location: [place] [facility];  Service: Cardiovascular;  Laterality: Bilateral;  . PERIPHERAL VASCULAR CATHETERIZATION N/A 01/29/2016   Procedure: Abdominal Aortogram;  Surgeon: Wellington Hampshire, MD;  Location: [place] [facility];  Service: Cardiovascular;  Laterality: N/A;  .  REFRACTIVE SURGERY Bilateral    "6 in one eye; 7 in the other; to relieve pressure; not glaucoma" (01/28/2016)  . SFA Right 06/27/2015   overlapping Bahn covered stents  . TUBAL LIGATION  1983  . VEIN HARVEST Left 05/17/2017   Procedure: LEFT LEG GREATER SAPHENOUS VEIN HARVEST;  Surgeon: Rosetta Posner, MD;  Location: [place];  Service: Vascular;  Laterality: Left;  Marland Kitchen VEIN REPAIR Right 01/31/2016   Procedure: RIGHT GREATER SAPHENOUS VEIN EXAMNED BUT NOT REMOVED;  Surgeon: Mal Misty, MD;  Location: [place];  Service: Vascular;  Laterality: Right;   Past Medical History:  Diagnosis Date  . Arthritis    "fingers, some in my knees" (01/28/2016)  . CAD (coronary artery disease), per cath 04/27/13, non obstructive disease, EF 65-70% 05/11/2013  . Carotid disease, bilateral ([place]) 09/2013   Lt carotid 50-69% stentosis, less on rt  . Dysrhythmia   . Fatty liver   . GERD (gastroesophageal reflux disease)   . Glaucoma   . H/O cardiovascular stress test 12/27/2009   negative for ischemia  .  Headache    "occasional since eye pressure regulated" (01/28/2016)  . Hyperlipidemia   . Hypertension   . Leukocytosis 07/15/2015  . Migraine    "stopped w/laser holes to relieve pressure in my eyes; had them 3-4 times/wk before taht" (01/28/2016)  . Mitral regurgitation,2+ by cath 05/11/2013  . NSTEMI (non-ST elevated myocardial infarction) ([place]) 04/27/2013  . Pain    BOTH KNEES - PT HAS TORN MENISCUS LEFT KNEE  . Paroxysmal atrial fibrillation (HCC)    hx of a fib on ASA  AND ELIQUIS   . Polycythemia vera ([place]) 08/20/2015   JAK-2 positive 07/19/15  . PVD (peripheral vascular disease) with claudication ([place]) 07/2006   previous L ext iliac stent and rt SFA stent, known occluded Lt SFA  . S/P cardiac cath 04/27/13   NON OBSTRUCTIVE DISEASE, 2+ mr, ef 65-70%  . Statin intolerance   . Stroke ([place]) 2006   SWELLING ALL OVER AND RT SIDE OF FACE DRAWN AND SPEECH SLURRED AND NUMBNESS ON RIGHT SIDE-- ALL RESOLVED   BP (!) 171/55   Pulse 72   SpO2 97%   Opioid Risk Score:   Fall Risk Score:  `1  Depression screen PHQ 2/9  Depression screen [facility] 2/9 01/11/2018 11/03/2017 07/06/2017 03/09/2017 10/13/2016 04/07/2016 10/29/2015  Decreased Interest 0 0 0 0 0 0 0  Down, Depressed, Hopeless 0 0 0 0 0 0 0  PHQ - 2 Score 0 0 0 0 0 0 0    Review of Systems  Constitutional: Negative.   HENT: Negative.   Eyes: Negative.   Respiratory: Negative.   Gastrointestinal: Negative.   Endocrine: Negative.   Genitourinary: Negative.   Musculoskeletal: Positive for gait problem.  Skin: Negative.   Allergic/Immunologic: Negative.   Hematological: Negative.   Psychiatric/Behavioral: Positive for dysphoric mood.  All other systems reviewed and are negative.     Objective:   Physical Exam Constitutional: [name] appears well-developed. NAD. HENT: Normocephalic. Atraumatic Eyes: EOM are normal. No discharge.  Cardiovascular: RRR. No JVD. Respiratory: Effort normal and breath sounds normal.  GI: Soft. Bowel sounds  are normal.  Musculoskeletal:  Gait: RLE externally rotated with poor pend at the knee with crutches Right AKA Neurological: [name] is alert and oriented.  Motor: B/L UE 5/5 proximal to distal LLE: 5/5 proximal to distal RLE: HF 5/5 HF  Skin: Incision healed.  Psychiatric: [name] has a normal mood and affect.  Her behavior is normal.     Assessment & Plan:  67 year old right-handed female, history of polycythemia vera, diagnosed July 2016, CAD, hypertension, atrial fibrillation presents for follow up for right AKA.  1. Right AKA 05/21/2017 as well as PVD with multiple revascularization procedures   Cont therapies  Poor fitting prosthesis, encouraged follow up with prosthesis for adjustments due to decrease in size  2. Phantom limb Pain:   Will increase Gabapentin 200 TID PRN  3. Gait abnormality with falls  Cont walker/wheelchair for safety  4. HTN  Elevated in office  Encouraged follow up with PCP  Cont therapies  5. Left knee OA  Voltaren gel refilled

## 2018-02-03 NOTE — Addendum Note (Signed)
Addended by: Delice Lesch A on: 02/03/2018 11:46 AM   Modules accepted: Orders

## 2018-02-03 NOTE — Progress Notes (Signed)
HGB 12.6 via hemocue.  Therapetic phlebotomy performed.  Initial stick to right a/c only drained 50cc before stopping.  Switched to left a/cand drained 450cc. Pt tolerated well

## 2018-02-07 ENCOUNTER — Encounter: Payer: Medicare Other | Admitting: Physical Therapy

## 2018-02-09 ENCOUNTER — Ambulatory Visit: Payer: Medicare Other | Admitting: Physical Therapy

## 2018-02-11 ENCOUNTER — Encounter: Payer: Medicare Other | Admitting: Physical Therapy

## 2018-02-14 ENCOUNTER — Ambulatory Visit: Payer: Medicare Other | Admitting: Physical Therapy

## 2018-02-14 DIAGNOSIS — M6281 Muscle weakness (generalized): Secondary | ICD-10-CM

## 2018-02-14 DIAGNOSIS — R2689 Other abnormalities of gait and mobility: Secondary | ICD-10-CM | POA: Diagnosis not present

## 2018-02-14 DIAGNOSIS — R2681 Unsteadiness on feet: Secondary | ICD-10-CM

## 2018-02-14 NOTE — Patient Instructions (Addendum)
Contrast Bath  Prepare the baths.  Cold = 55-65 degrees     Hot = 105-110 degrees  Starting with the hot, dip hand or foot all the way into the water and hold there for selected duration.  Preferably 3 minutes.  After selected duration is up, dip hand or foot into the cold for 1/3 duration of the hot. (3 minutes hot, 1 minute cold)  Alternate back and forth for the times indicated for no more than a total of 20 minutes ending with hot.  Phantom Limb Pain Phantom limb pain occurs in an arm or leg following an amputation. It is pain in an extremity that no longer exists. This pain varies with different patients. Different activities may cause the pain. Some people with an amputated limb experience the opposite of phantom pain, which is phantom pleasure. The trouble may start in a part of the brain known as the sensory cortex. The sensory cortex is the portion of your brain that processes sensations from the rest of your body. It is hypothesized that when a body part is lost, the corresponding part of the brain is not able to handle the loss and rewires its circuitry to make up for the signals it no longer receives from the missing extremity. The exact mechanism of how phantom limb pain occurs is not known. The severity of pain seems to be correlated with personal problems such as stress and attitude. It also seems to correlate with the amount of pain a person had before the operation. What are the causes?  Damaged nerve endings.  Scar tissue at the amputation site. How is this treated? Phantom limb pain can be severe and debilitating. Most cases of phantom limb pain only last briefly. There are a number of different therapies and medications that may give relief. Keep working with your health care provider until relief is obtained. Some treatments that may be helpful include:  Hypnosis and mental imagery. Their techniques can give patients the needed impetus to recognize their ability to regain  control.  Biofeedback.  Relaxation techniques. They are related to hypnosis techniques and use the mind and body to control pain.  Acupuncture.  Massage.  Exercise.  Antidepressant medicine.  Anticonvulsant medicine.  Narcotics or pain medicines.  Contact a health care provider if: Pain is not relieved or increases. This information is not intended to replace advice given to you by your health care provider. Make sure you discuss any questions you have with your health care provider. Document Released: 02/27/2003 Document Revised: 08/18/2016 Document Reviewed: 05/09/2013 Elsevier Interactive Patient Education  2017 [place].    Neuropathic Pain Neuropathic pain is pain caused by damage to the nerves that are responsible for certain sensations in your body (sensory nerves). The pain can be caused by damage to:  The sensory nerves that send signals to your spinal cord and brain (peripheral nervous system).  The sensory nerves in your brain or spinal cord (central nervous system).  Neuropathic pain can make you more sensitive to pain. What would be a minor sensation for most people may feel very painful if you have neuropathic pain. This is usually a long-term condition that can be difficult to treat. The type of pain can differ from person to person. It may start suddenly (acute), or it may develop slowly and last for a long time (chronic). Neuropathic pain may come and go as damaged nerves heal or may stay at the same level for years. It often causes emotional distress,  loss of sleep, and a lower quality of life. What are the causes? The most common cause of damage to a sensory nerve is diabetes. Many other diseases and conditions can also cause neuropathic pain. Causes of neuropathic pain can be classified as:  Toxic. Many drugs and chemicals can cause toxic damage. The most common cause of toxic neuropathic pain is damage from drug treatment for cancer  (chemotherapy).  Metabolic. This type of pain can happen when a disease causes imbalances that damage nerves. Diabetes is the most common of these diseases. Vitamin B deficiency caused by long-term alcohol abuse is another common cause.  Traumatic. Any injury that cuts, crushes, or stretches a nerve can cause damage and pain. A common example is feeling pain after losing an arm or leg (phantom limb pain).  Compression-related. If a sensory nerve gets trapped or compressed for a long period of time, the blood supply to the nerve can be cut off.  Vascular. Many blood vessel diseases can cause neuropathic pain by decreasing blood supply and oxygen to nerves.  Autoimmune. This type of pain results from diseases in which the body's defense system mistakenly attacks sensory nerves. Examples of autoimmune diseases that can cause neuropathic pain include lupus and multiple sclerosis.  Infectious. Many types of viral infections can damage sensory nerves and cause pain. Shingles infection is a common cause of this type of pain.  Inherited. Neuropathic pain can be a symptom of many diseases that are passed down through families (genetic).  What are the signs or symptoms? The main symptom is pain. Neuropathic pain is often described as:  Burning.  Shock-like.  Stinging.  Hot or cold.  Itching.  How is this diagnosed? No single test can diagnose neuropathic pain. Your health care provider will do a physical exam and ask you about your pain. You may use a pain scale to describe how bad your pain is. You may also have tests to see if you have a high sensitivity to pain and to help find the cause and location of any sensory nerve damage. These tests may include:  Imaging studies, such as: ? X-rays. ? CT scan. ? MRI.  Nerve conduction studies to test how well nerve signals travel through your sensory nerves (electrodiagnostic testing).  Stimulating your sensory nerves through electrodes on your  skin and measuring the response in your spinal cord and brain (somatosensory evoked potentials).  How is this treated? Treatment for neuropathic pain may change over time. You may need to try different treatment options or a combination of treatments. Some options include:  Over-the-counter pain relievers.  Prescription medicines. Some medicines used to treat other conditions may also help neuropathic pain. These include medicines to: ? Control seizures (anticonvulsants). ? Relieve depression (antidepressants).  Prescription-strength pain relievers (narcotics). These are usually used when other pain relievers do not help.  Transcutaneous nerve stimulation (TENS). This uses electrical currents to block painful nerve signals. The treatment is painless.  Topical and local anesthetics. These are medicines that numb the nerves. They can be injected as a nerve block or applied to the skin.  Alternative treatments, such as: ? Acupuncture. ? Meditation. ? Massage. ? Physical therapy. ? Pain management programs. ? Counseling.  Follow these instructions at home:  Learn as much as you can about your condition.  Take medicines only as directed by your health care provider.  Work closely with all your health care providers to find what works best for you.  Have a good support system  at home.  Consider joining a chronic pain support group. Contact a health care provider if:  Your pain treatments are not helping.  You are having side effects from your medicines.  You are struggling with fatigue, mood changes, depression, or anxiety. This information is not intended to replace advice given to you by your health care provider. Make sure you discuss any questions you have with your health care provider. Document Released: 09/03/2004 Document Revised: 06/26/2016 Document Reviewed: 05/17/2014 Elsevier Interactive Patient Education  2018 [place]. Phantom Limb Pain Phantom limb pain  occurs in an arm or leg following an amputation. It is pain in an extremity that no longer exists. This pain varies with different patients. Different activities may cause the pain. Some people with an amputated limb experience the opposite of phantom pain, which is phantom pleasure. The trouble may start in a part of the brain known as the sensory cortex. The sensory cortex is the portion of your brain that processes sensations from the rest of your body. It is hypothesized that when a body part is lost, the corresponding part of the brain is not able to handle the loss and rewires its circuitry to make up for the signals it no longer receives from the missing extremity. The exact mechanism of how phantom limb pain occurs is not known. The severity of pain seems to be correlated with personal problems such as stress and attitude. It also seems to correlate with the amount of pain a person had before the operation. What are the causes?  Damaged nerve endings.  Scar tissue at the amputation site. How is this treated? Phantom limb pain can be severe and debilitating. Most cases of phantom limb pain only last briefly. There are a number of different therapies and medications that may give relief. Keep working with your health care provider until relief is obtained. Some treatments that may be helpful include:  Hypnosis and mental imagery. Their techniques can give patients the needed impetus to recognize their ability to regain control.  Biofeedback.  Relaxation techniques. They are related to hypnosis techniques and use the mind and body to control pain.  Acupuncture.  Massage.  Exercise.  Antidepressant medicine.  Anticonvulsant medicine.  Narcotics or pain medicines.  Contact a health care provider if: Pain is not relieved or increases. This information is not intended to replace advice given to you by your health care provider. Make sure you discuss any questions you have with your  health care provider. Document Released: 02/27/2003 Document Revised: 08/18/2016 Document Reviewed: 05/09/2013 Elsevier Interactive Patient Education  2017 [place] A contusion is a deep bruise. Contusions happen when an injury causes bleeding under the skin. Symptoms of bruising include pain, swelling, and discolored skin. The skin may turn blue, purple, or yellow. Follow these instructions at home:  Rest the injured area.  If told, put ice on the injured area. ? Put ice in a plastic bag. ? Place a towel between your skin and the bag. ? Leave the ice on for 20 minutes, 2-3 times per day.  If told, put light pressure (compression) on the injured area using an elastic bandage. Make sure the bandage is not too tight. Remove it and put it back on as told by your doctor.  If possible, raise (elevate) the injured area above the level of your heart while you are sitting or lying down.  Take over-the-counter and prescription medicines only as told by your doctor. Contact a  doctor if:  Your symptoms do not get better after several days of treatment.  Your symptoms get worse.  You have trouble moving the injured area. Get help right away if:  You have very bad pain.  You have a loss of feeling (numbness) in a hand or foot.  Your hand or foot turns pale or cold. This information is not intended to replace advice given to you by your health care provider. Make sure you discuss any questions you have with your health care provider. Document Released: 05/25/2008 Document Revised: 05/14/2016 Document Reviewed: 04/24/2015 Elsevier Interactive Patient Education  2018 [employer].

## 2018-02-14 NOTE — Therapy (Signed)
[facility] [address] [place], [place], [address] Phone: [phone]   Fax:  [phone]  Physical Therapy Treatment  Patient Details  Name: [name] MRN: [id number] Date of Birth: 03-19-1951 Referring Provider: Jamse Arn, MD   Encounter Date: 02/14/2018  PT End of Session - 02/14/18 1128    Visit Number  22    Number of Visits  29    Date for PT Re-Evaluation  03/04/18    Authorization Type  Medicare progress note every 85YI visit recert sent visit 13    PT Start Time  0930    PT Stop Time  1015    PT Time Calculation (min)  45 min    Activity Tolerance  Patient tolerated treatment well;Patient limited by fatigue;Patient limited by pain    Behavior During Therapy  [employer] for tasks assessed/performed       Past Medical History:  Diagnosis Date  . Arthritis    "fingers, some in my knees" (01/28/2016)  . CAD (coronary artery disease), per cath 04/27/13, non obstructive disease, EF 65-70% 05/11/2013  . Carotid disease, bilateral ([place]) 09/2013   Lt carotid 50-69% stentosis, less on rt  . Dysrhythmia   . Fatty liver   . GERD (gastroesophageal reflux disease)   . Glaucoma   . H/O cardiovascular stress test 12/27/2009   negative for ischemia  . Headache    "occasional since eye pressure regulated" (01/28/2016)  . Hyperlipidemia   . Hypertension   . Leukocytosis 07/15/2015  . Migraine    "stopped w/laser holes to relieve pressure in my eyes; had them 3-4 times/wk before taht" (01/28/2016)  . Mitral regurgitation,2+ by cath 05/11/2013  . NSTEMI (non-ST elevated myocardial infarction) ([place]) 04/27/2013  . Pain    BOTH KNEES - PT HAS TORN MENISCUS LEFT KNEE  . Paroxysmal atrial fibrillation (HCC)    hx of a fib on ASA  AND ELIQUIS   . Polycythemia vera ([employer]) 08/20/2015   JAK-2 positive 07/19/15  . PVD (peripheral vascular disease) with claudication ([place]) 07/2006   previous L ext iliac stent and rt SFA stent, known  occluded Lt SFA  . S/P cardiac cath 04/27/13   NON OBSTRUCTIVE DISEASE, 2+ mr, ef 65-70%  . Statin intolerance   . Stroke ([place]) 2006   SWELLING ALL OVER AND RT SIDE OF FACE DRAWN AND SPEECH SLURRED AND NUMBNESS ON RIGHT SIDE-- ALL RESOLVED    Past Surgical History:  Procedure Laterality Date  . AMPUTATION Right 05/21/2017   Procedure: AMPUTATION ABOVE KNEE;  Surgeon: Rosetta Posner, MD;  Location: [facility];  Service: Vascular;  Laterality: Right;  . CARDIAC CATHETERIZATION  04/27/13   non occlusive disease with mild to mod. calcified lesions in ostial LM and proximal LAD and moderate prox. RC AND DISTAL AV GROOVE LCX, ef  . CATARACT EXTRACTION W/PHACO Right 03/21/2015   Procedure: CATARACT EXTRACTION PHACO AND INTRAOCULAR LENS PLACEMENT RIGHT EYE;  Surgeon: Baruch Goldmann, MD;  [facility]: [facility];  Service: Ophthalmology;  Laterality: Right;  CDE:5.10  . CATARACT EXTRACTION W/PHACO Left 05/16/2015   Procedure: CATARACT EXTRACTION PHACO AND INTRAOCULAR LENS PLACEMENT (IOC);  Surgeon: Baruch Goldmann, MD;  Location: [facility];  Service: Ophthalmology;  Laterality: Left;  CDE 5.57  . [place]  . COLONOSCOPY N/A 09/11/2016   Procedure: COLONOSCOPY;  Surgeon: Rogene Houston, MD;  [facility];  Service: Endoscopy;  Laterality: N/A;  730-moved to 1:00 Ann notified pt  .  EMBOLECTOMY Right 02/01/2016   Procedure: Thrombectomy of Right Femoral-Popliteal Bypass Graft; Endarterectomy of Right Below Knee Popliteal Artery and Tibial Peroneal Trunk with Bovine Pericardium Patch Angioplasty.;  Surgeon: Angelia Mould, MD;  Location: [facility];  Service: Vascular;  Laterality: Right;  . FEMORAL-POPLITEAL BYPASS GRAFT Right 01/31/2016   Procedure: BYPASS GRAFT RIGHT COMMON  FEMORALTO BELOW KNEE POPLITEAL ARTERY BYPASS GRAFT USING 6MM PROPATEN GORTEX GRAFT;  Surgeon: Mal Misty, MD;  Location: [facility];  Service: Vascular;  Laterality: Right;  . FEMORAL-POPLITEAL BYPASS GRAFT Right 01/09/2017    Procedure: THROMBECTOMY OF RIGHT FEMORAL-POPLITEAL  ARTERY BYPASS GRAFT; THROMBECTOMY RIGHT  TIBIAL VESSELS;  Surgeon: [name], MD;  Location: [facility];  Service: Vascular;  Laterality: Right;  . FEMORAL-POPLITEAL BYPASS GRAFT Right 05/17/2017   Procedure: RIGHT FEMORAL-TIBIAL PERONEAL TRUNK ARTERY BYPASS GRAFT USING 35mX80cm PROPATEN GRAFT WITH REMOVABLE RING;  Surgeon: ERosetta Posner MD;  [facility]  Service: Vascular;  Laterality: Right;  . FRACTURE SURGERY    . ILIAC ARTERY STENT  07/2006   external iliac and Rt SFA stent 07/2006 AND THE LEFT WAS IN 2006  . KNEE ARTHROSCOPY WITH MEDIAL MENISECTOMY Left 03/27/2014   Procedure: left knee arthorscopy with medial chondraplasty of the medial femoral and patella, medial microfracture technique of medial femoral condyl;  Surgeon: RTobi Bastos MD;  [facility]: [facility];  Service: Orthopedics;  Laterality: Left;  . LEFT HEART CATHETERIZATION WITH CORONARY ANGIOGRAM Right 04/27/2013   Procedure: LEFT HEART CATHETERIZATION WITH CORONARY ANGIOGRAM;  Surgeon: DLeonie Man MD;  [facility];  Service: Cardiovascular;  Laterality: Right;  . LOWER EXTREMITY ANGIOGRAM Right 01/31/2016   Procedure: INTRAOP RIGHT LOWER EXTREMITY ANGIOGRAM;  Surgeon: JMal Misty MD;  [facility]  Service: Vascular;  Laterality: Right;  . ORIF WRIST FRACTURE Right 1983  . OVARIAN CYST REMOVAL Right   . PATCH ANGIOPLASTY Right 01/31/2016   Procedure: RIGHT COMMON FEMORAL AND PROFUNDA FEMORIS ENDARECTOMY WITH PATCH ANGIOPLASTY;  Surgeon: JMal Misty MD;  [facility]  Service: Vascular;  Laterality: Right;  . PATCH ANGIOPLASTY Right 01/09/2017   Procedure: PATCH ANGIOPLASTY RIGHT POPLITEAL ARTERY BYPASS GRAFT;  Surgeon: CElam Dutch MD;  [facility]  Service: Vascular;  Laterality: Right;  . PERIPHERAL VASCULAR CATHETERIZATION N/A 06/27/2015   Procedure: Lower Extremity Angiography;  Surgeon: JLorretta Harp MD;  [facility];  Service: Cardiovascular;  Laterality: N/A;  . PERIPHERAL VASCULAR CATHETERIZATION Bilateral 01/29/2016   Procedure: Lower Extremity Angiography;  Surgeon: MWellington Hampshire MD;  [facility];  Service: Cardiovascular;  Laterality: Bilateral;  . PERIPHERAL VASCULAR CATHETERIZATION N/A 01/29/2016   Procedure: Abdominal Aortogram;  Surgeon: MWellington Hampshire MD;  [facility];  Service: Cardiovascular;  Laterality: N/A;  . REFRACTIVE SURGERY Bilateral    "6 in one eye; 7 in the other; to relieve pressure; not glaucoma" (01/28/2016)  . SFA Right 06/27/2015   overlapping Bahn covered stents  . TUBAL LIGATION  1983  . VEIN HARVEST Left 05/17/2017   Procedure: LEFT LEG GREATER SAPHENOUS VEIN HARVEST;  Surgeon: ERosetta Posner MD;  [facility]  Service: Vascular;  Laterality: Left;  .Marland KitchenVEIN REPAIR Right 01/31/2016   Procedure: RIGHT GREATER SAPHENOUS VEIN EXAMNED BUT NOT REMOVED;  Surgeon: JMal Misty MD;  [facility]  Service: Vascular;  Laterality: Right;    There were no vitals filed for this visit.  Subjective Assessment -  02/14/18 0934    Subjective  pt reports she had a fall on 2/15. pt report she stepped off a curb with the incorrect foot resulting her falling and landing on all 4s. pt reports pain was in her residual limb but has significantly improved since her fall. she reports she had imaging done on her R knee and  distal femur that was negitive. Pt also reports that she has not been wearing her prosthesis since the fall becouse she was staying in bed due to pain.     Pertinent History  right TFA, arthritis, CAD,A-Fib, PVD, CVA, Glaucoma, migraines, HTN, NSTEMI,  knee pain/left torn meniscus,     Limitations  Lifting;Standing;Walking    Patient Stated Goals  To use prosthesis walking. Previously 2-4 miles.     Currently in Pain?  Yes    Pain Score  7     Pain Location  Leg    Pain Orientation  Right;Distal    Pain Descriptors / Indicators   Aching;Tender    Pain Type  Acute pain    Pain Onset  1 to 4 weeks ago    Pain Frequency  Constant    Aggravating Factors   weight brearing( walking and standing)    Pain Relieving Factors  seated positon, rest, non weight brearing positions    Effect of Pain on Daily Activities  walking, standing                      [facility] Adult PT Treatment/Exercise - 02/14/18 0930      Transfers   Transfers  Stand to Sit;Sit to Stand    Sit to Stand  5: Supervision;With upper extremity assist;With armrests;From chair/3-in-1;6: Modified independent (Device/Increase time)    Sit to Stand Details (indicate cue type and reason)  increased time to transfer  due to pain in residual limb.    Stand to Sit  5: Supervision;6: Modified independent (Device/Increase time);With upper extremity assist;With armrests;To chair/3-in-1      Ambulation/Gait   Ambulation/Gait  Yes    Ambulation/Gait Assistance  5: Supervision    Ambulation/Gait Assistance Details  pt unable to ambulate with quad tip cane due to incrase in  pain. pt gait speed significantly reduced with crutches and demonstrates angalgic gait pattern    Ambulation Distance (Feet)  110 Feet    Assistive device  Crutches    Gait Pattern  Step-through pattern;Narrow base of support;Decreased stride length;Decreased weight shift to right;Decreased stance time - right;Antalgic    Ambulation Surface  Level;Indoor      Prosthetics   Education Provided  Residual limb care;Proper weight-bearing schedule/adjustment wear when hurt of sick( in or out of bed)    Person(s) Educated  Patient    Education Method  Explanation;Demonstration    Education Method  Verbalized understanding          Balance Exercises - 02/14/18 0930      Balance Exercises: Standing   Standing Eyes Closed  Head turns;2 reps;30 secs pt weight shift away from R due to pain     Standing, One Foot on a Step  2 inch;30 secs;Head turns;Eyes closed        PT Education -  02/14/18 0940    Education provided  Yes    Education Details  education on falls reduction, and what to do when sick or ill and unable to be upright PT recommends that pt continue to wear all awake hours in order to continue to support  residual limb and reduce further falls risk. Mirror therapy  to reduce phantom pains. difference between phantom pain, sensation, and limb pain. how to identify them and how to respond,  falls  and risk of fracture(signs and when to see medical attention).    Person(s) Educated  Patient    Methods  Explanation;Demonstration    Comprehension  Verbalized understanding;Returned demonstration       PT Short Term Goals - 01/31/18 1045      PT SHORT TERM GOAL #1   Title  Patient verbalizes & demonstrates proper donning with correct rotation.  (All STGs Target Date: 02/03/2018)    Baseline  MET 01/31/2018    Time  4    Period  Weeks    Status  Achieved      PT SHORT TERM GOAL #2   Title  Patient able to reach 5" & to floor with cane support with supervision.     Baseline  MET 01/31/2018    Time  4    Period  Weeks    Status  Achieved    Target Date  02/03/18      PT SHORT TERM GOAL #3   Title  Patient demonstrates standing ADLs like cooking in kitchen or laundry with prosthesis with supervision.     Baseline  MET 01/31/2018    Time  4    Period  Weeks    Status  Achieved    Target Date  02/03/18      PT SHORT TERM GOAL #4   Title  Patient ambulates 200' with cane & prosthesis with supervision.     Baseline  MET 01/31/2018-- required extra time, slow gait speed with SBSC     Time  4    Period  Weeks    Status  Achieved    Target Date  02/03/18      PT SHORT TERM GOAL #5   Title  Patient negotiates stairs (1 rails), ramps & curbs with cane & prosthesis with minimal assist    Baseline  MET 01/31/2018 MinA with SBSC on curb/ ramps    Time  4    Period  Weeks    Status  Achieved    Target Date  02/03/18        PT Long Term Goals - 01/05/18 1135       PT LONG TERM GOAL #1   Title  Patient verbalizes & demonstrates proper prosthetic care to enable safe use of prosthesis. (All LTGs Target Date: 03/04/2018)    Time  4    Period  Months    Status  On-going    Target Date  03/04/18      PT LONG TERM GOAL #2   Title  Patient tolerates wear of prosthesis >90% of awake hours without skin issues or limb pain to enable function during her day.     Time  4    Period  Months    Status  On-going    Target Date  03/04/18      PT LONG TERM GOAL #3   Title  Patient ambulates household around furniture carrying items with cane or less & prosthesis modified independent.     Time  4    Period  Months    Status  On-going    Target Date  03/04/18      PT LONG TERM GOAL #4   Title  Berg Balance >/= 45/56 to indicate lower fall risk.     Time  4    Period  Months    Status  On-going    Target Date  03/04/18      PT LONG TERM GOAL #5   Title  Patient ambulates >1000' with LRAD & prosthesis outdoors including grass, ramps & curbs modified independent.     Time  4    Period  Months    Status  On-going    Target Date  03/04/18      PT LONG TERM GOAL #6   Title  Patient performs standing ADLs including cooking, laundry, sweeping, etc with prosthesis & LRAD modified indepedent.     Time  4    Period  Months    Status  On-going    Target Date  03/04/18            Plan - 02/14/18 1129    Clinical Impression Statement  Today's PT session was limited due to patients reports of pain  while in weight bearing since her fall  on 2/15. Time was spent on patient education of falls prevention risk,  and phantom limb pains( how to self treat). Although in pain, patient  continues to be well motivated to continue to progress in PT and reduce her falls and falls risk. patient will continue to benefit from skilled PT intervention to reduce falls and risk of falling.     Rehab Potential  Good    Clinical Impairments Affecting Rehab Potential  right TFA,  arthritis, CAD,A-Fib, PVD, CVA, Glaucoma, migraines, HTN, NSTEMI,  knee pain/left torn meniscus,     PT Frequency  2x / week    PT Duration  8 weeks    PT Treatment/Interventions  ADLs/Self Care Home Management;DME Instruction;Gait training;Stair training;Functional mobility training;Therapeutic activities;Therapeutic exercise;Balance training;Neuromuscular re-education;Patient/family education;Prosthetic Training    PT Next Visit Plan  work on curb training( stepping pattern. as well as scanning , carrying object tasks with gait to progress towards LTGs.    Consulted and Agree with Plan of Care  Patient       Patient will benefit from skilled therapeutic intervention in order to improve the following deficits and impairments:  Abnormal gait, Decreased activity tolerance, Decreased balance, Decreased endurance, Decreased knowledge of use of DME, Decreased mobility, Decreased strength, Prosthetic Dependency, Postural dysfunction  Visit Diagnosis: Other abnormalities of gait and mobility  Muscle weakness (generalized)  Unsteadiness on feet     Problem List Patient Active Problem List   Diagnosis Date Noted  . Phantom limb pain ([place])   . Hypertensive crisis   . Induration of skin   . Abnormality of gait   . Hypoalbuminemia due to protein-calorie malnutrition ([place])   . Amputee, above knee, right ([place]) 05/25/2017  . Unilateral AKA, right ([place])   . Elevated troponin   . PVD (peripheral vascular disease) ([place])   . Benign essential HTN   . Acute blood loss anemia   . Ischemia of right lower extremity 05/17/2017  . Thrombosis of right femoral artery ([place]) 01/09/2017  . Ischemia of extremity 01/09/2017  . History of colonic polyps 07/30/2016  . Essential hypertension 01/08/2016  . Polycythemia vera ([name]) 08/20/2015  . Leukocytosis 07/15/2015  . S/P arterial stent right SFA overlapping Bahn covered stents 06/27/2015 06/28/2015  . Claudication ([place]) 06/27/2015  . Carotid artery disease  ([place]) 08/24/2014  . Osteoarthritis of left knee 03/27/2014  . External hemorrhoids 06/06/2013  . PAF (paroxysmal atrial fibrillation), maintaing SR 05/11/2013  . Chronic anticoagulation, new- eliquis 05/11/2013  . CAD (coronary artery  disease), per cath 04/27/13, non obstructive disease 20-30% LM; LAD 30%; 50-60% OM2; RCA 40%; EF 65-70% 05/11/2013  . Mitral regurgitation,2+ by cath 05/11/2013  . Atrial fibrillation with RVR, hx of PAF previously 04/27/2013  . PAD (peripheral artery disease), Lt carotid 50-70%: , Hx stent Lt exteral iliac & RSFA stent, known occl. LSFA  04/27/2013  . Hyperlipidemia 04/27/2013  . History of CVA (cerebrovascular accident) 04/27/2013  . NSTEMI (non-ST elevated myocardial infarction) ([place]) 04/27/2013    Waunita Schooner SPT 02/14/2018, 11:40 AM  [facility] [address] [place], [place], [address] Phone: [phone]   Fax:  [phone]  Name: [name] MRN: [id number] Date of Birth: Oct 31, 1951

## 2018-02-16 ENCOUNTER — Encounter: Payer: Self-pay | Admitting: Physical Therapy

## 2018-02-16 ENCOUNTER — Ambulatory Visit: Payer: Medicare Other | Admitting: Physical Therapy

## 2018-02-16 DIAGNOSIS — M6281 Muscle weakness (generalized): Secondary | ICD-10-CM

## 2018-02-16 DIAGNOSIS — R2689 Other abnormalities of gait and mobility: Secondary | ICD-10-CM | POA: Diagnosis not present

## 2018-02-16 DIAGNOSIS — R2681 Unsteadiness on feet: Secondary | ICD-10-CM

## 2018-02-16 NOTE — Therapy (Signed)
[facility] [address] [place], [place], [address] Phone: [phone]   Fax:  [phone]  Physical Therapy Treatment  Patient Details  Name: [name] MRN: [id number] Date of Birth: 1950-12-23 Referring Provider: Jamse Arn, MD   Encounter Date: 02/16/2018  PT End of Session - 02/16/18 1230    Visit Number  23    Number of Visits  29    Date for PT Re-Evaluation  03/04/18    Authorization Type  Medicare progress note every 51VO visit recert sent visit 13    PT Start Time  0930    PT Stop Time  1015    PT Time Calculation (min)  45 min    Activity Tolerance  Patient tolerated treatment well;Patient limited by fatigue;Patient limited by pain    Behavior During Therapy  [facility] for tasks assessed/performed       Past Medical History:  Diagnosis Date  . Arthritis    "fingers, some in my knees" (01/28/2016)  . CAD (coronary artery disease), per cath 04/27/13, non obstructive disease, EF 65-70% 05/11/2013  . Carotid disease, bilateral ([place]) 09/2013   Lt carotid 50-69% stentosis, less on rt  . Dysrhythmia   . Fatty liver   . GERD (gastroesophageal reflux disease)   . Glaucoma   . H/O cardiovascular stress test 12/27/2009   negative for ischemia  . Headache    "occasional since eye pressure regulated" (01/28/2016)  . Hyperlipidemia   . Hypertension   . Leukocytosis 07/15/2015  . Migraine    "stopped w/laser holes to relieve pressure in my eyes; had them 3-4 times/wk before taht" (01/28/2016)  . Mitral regurgitation,2+ by cath 05/11/2013  . NSTEMI (non-ST elevated myocardial infarction) ([place]) 04/27/2013  . Pain    BOTH KNEES - PT HAS TORN MENISCUS LEFT KNEE  . Paroxysmal atrial fibrillation (HCC)    hx of a fib on ASA  AND ELIQUIS   . Polycythemia vera ([place]) 08/20/2015   JAK-2 positive 07/19/15  . PVD (peripheral vascular disease) with claudication ([place]) 07/2006   previous L ext iliac stent and rt SFA stent, known  occluded Lt SFA  . S/P cardiac cath 04/27/13   NON OBSTRUCTIVE DISEASE, 2+ mr, ef 65-70%  . Statin intolerance   . Stroke ([place]) 2006   SWELLING ALL OVER AND RT SIDE OF FACE DRAWN AND SPEECH SLURRED AND NUMBNESS ON RIGHT SIDE-- ALL RESOLVED    Past Surgical History:  Procedure Laterality Date  . AMPUTATION Right 05/21/2017   Procedure: AMPUTATION ABOVE KNEE;  Surgeon: Rosetta Posner, MD;  Location: [facility];  Service: Vascular;  Laterality: Right;  . CARDIAC CATHETERIZATION  04/27/13   non occlusive disease with mild to mod. calcified lesions in ostial LM and proximal LAD and moderate prox. RC AND DISTAL AV GROOVE LCX, ef  . CATARACT EXTRACTION W/PHACO Right 03/21/2015   Procedure: CATARACT EXTRACTION PHACO AND INTRAOCULAR LENS PLACEMENT RIGHT EYE;  Surgeon: Baruch Goldmann, MD;  Location: [facility];  Service: Ophthalmology;  Laterality: Right;  CDE:5.10  . CATARACT EXTRACTION W/PHACO Left 05/16/2015   Procedure: CATARACT EXTRACTION PHACO AND INTRAOCULAR LENS PLACEMENT (IOC);  Surgeon: Baruch Goldmann, MD;  Location: [facility];  Service: Ophthalmology;  Laterality: Left;  CDE 5.57  . [place]  . COLONOSCOPY N/A 09/11/2016   Procedure: COLONOSCOPY;  Surgeon: Rogene Houston, MD;  [facility];  Service: Endoscopy;  Laterality: N/A;  730-moved to 1:00 Ann notified pt  .  EMBOLECTOMY Right 02/01/2016   Procedure: Thrombectomy of Right Femoral-Popliteal Bypass Graft; Endarterectomy of Right Below Knee Popliteal Artery and Tibial Peroneal Trunk with Bovine Pericardium Patch Angioplasty.;  Surgeon: Angelia Mould, MD;  Location: [facility];  Service: Vascular;  Laterality: Right;  . FEMORAL-POPLITEAL BYPASS GRAFT Right 01/31/2016   Procedure: BYPASS GRAFT RIGHT COMMON  FEMORALTO BELOW KNEE POPLITEAL ARTERY BYPASS GRAFT USING 6MM PROPATEN GORTEX GRAFT;  Surgeon: Mal Misty, MD;  Location: [facility];  Service: Vascular;  Laterality: Right;  . FEMORAL-POPLITEAL BYPASS GRAFT Right 01/09/2017    Procedure: THROMBECTOMY OF RIGHT FEMORAL-POPLITEAL  ARTERY BYPASS GRAFT; THROMBECTOMY RIGHT  TIBIAL VESSELS;  Surgeon: [name], MD;  Location: [facility];  Service: Vascular;  Laterality: Right;  . FEMORAL-POPLITEAL BYPASS GRAFT Right 05/17/2017   Procedure: RIGHT FEMORAL-TIBIAL PERONEAL TRUNK ARTERY BYPASS GRAFT USING 35mX80cm PROPATEN GRAFT WITH REMOVABLE RING;  Surgeon: ERosetta Posner MD;  [facility]  Service: Vascular;  Laterality: Right;  . FRACTURE SURGERY    . ILIAC ARTERY STENT  07/2006   external iliac and Rt SFA stent 07/2006 AND THE LEFT WAS IN 2006  . KNEE ARTHROSCOPY WITH MEDIAL MENISECTOMY Left 03/27/2014   Procedure: left knee arthorscopy with medial chondraplasty of the medial femoral and patella, medial microfracture technique of medial femoral condyl;  Surgeon: RTobi Bastos MD;  [facility];  Service: Orthopedics;  Laterality: Left;  . LEFT HEART CATHETERIZATION WITH CORONARY ANGIOGRAM Right 04/27/2013   Procedure: LEFT HEART CATHETERIZATION WITH CORONARY ANGIOGRAM;  Surgeon: DLeonie Man MD;  [facility];  Service: Cardiovascular;  Laterality: Right;  . LOWER EXTREMITY ANGIOGRAM Right 01/31/2016   Procedure: INTRAOP RIGHT LOWER EXTREMITY ANGIOGRAM;  Surgeon: JMal Misty MD;  [facility] [facility]  Service: Vascular;  Laterality: Right;  . ORIF WRIST FRACTURE Right 1983  . OVARIAN CYST REMOVAL Right   . PATCH ANGIOPLASTY Right 01/31/2016   Procedure: RIGHT COMMON FEMORAL AND PROFUNDA FEMORIS ENDARECTOMY WITH PATCH ANGIOPLASTY;  Surgeon: JMal Misty MD;  [facility] [facility]  Service: Vascular;  Laterality: Right;  . PATCH ANGIOPLASTY Right 01/09/2017   Procedure: PATCH ANGIOPLASTY RIGHT POPLITEAL ARTERY BYPASS GRAFT;  Surgeon: CElam Dutch MD;  [facility]  Service: Vascular;  Laterality: Right;  . PERIPHERAL VASCULAR CATHETERIZATION N/A 06/27/2015   Procedure: Lower Extremity Angiography;  Surgeon: JLorretta Harp MD;  [facility];  Service: Cardiovascular;  Laterality: N/A;  . PERIPHERAL VASCULAR CATHETERIZATION Bilateral 01/29/2016   Procedure: Lower Extremity Angiography;  Surgeon: MWellington Hampshire MD;  [facility];  Service: Cardiovascular;  Laterality: Bilateral;  . PERIPHERAL VASCULAR CATHETERIZATION N/A 01/29/2016   Procedure: Abdominal Aortogram;  Surgeon: MWellington Hampshire MD;  [facility];  Service: Cardiovascular;  Laterality: N/A;  . REFRACTIVE SURGERY Bilateral    "6 in one eye; 7 in the other; to relieve pressure; not glaucoma" (01/28/2016)  . SFA Right 06/27/2015   overlapping Bahn covered stents  . TUBAL LIGATION  1983  . VEIN HARVEST Left 05/17/2017   Procedure: LEFT LEG GREATER SAPHENOUS VEIN HARVEST;  Surgeon: ERosetta Posner MD;  [facility]  Service: Vascular;  Laterality: Left;  .Marland KitchenVEIN REPAIR Right 01/31/2016   Procedure: RIGHT GREATER SAPHENOUS VEIN EXAMNED BUT NOT REMOVED;  Surgeon: JMal Misty MD;  [facility]  Service: Vascular;  Laterality: Right;    There were no vitals filed for this visit.  Subjective Assessment -  02/16/18 0935    Subjective  Pt reports that she left a message for her Dr. and has talked to her pharmacist to help her wean off the Gabapentin due to symptoms of depresssion.  Just taking gabapentin as needed at night.    Pertinent History  right TFA, arthritis, CAD,A-Fib, PVD, CVA, Glaucoma, migraines, HTN, NSTEMI,  knee pain/left torn meniscus,     Limitations  Lifting;Standing;Walking    Patient Stated Goals  To use prosthesis walking. Previously 2-4 miles.     Currently in Pain?  Yes    Pain Score  8     Pain Location  Leg    Pain Orientation  Right;Distal    Pain Descriptors / Indicators  Throbbing;Squeezing    Pain Type  Other (Comment) pressure in residual limb with weight bearing.    Pain Onset  1 to 4 weeks ago    Pain Frequency  Intermittent                      [facility] Adult PT Treatment/Exercise -  02/16/18 0001      Transfers   Transfers  Stand to Sit;Sit to Stand    Sit to Stand  5: Supervision;With upper extremity assist;With armrests;From chair/3-in-1;6: Modified independent (Device/Increase time)    Sit to Stand Details  Verbal cues for technique    Stand to Sit  5: Supervision      Ambulation/Gait   Ambulation/Gait  Yes    Ambulation/Gait Assistance  4: Min guard    Ambulation/Gait Assistance Details  Reports pressure pain during gait able to tolerate 1 lap before needing to sit and rest to alleviate the pressure.      Ambulation Distance (Feet)  115 Feet    Assistive device  Straight cane quad tip    Gait Pattern  Step-through pattern;Narrow base of support;Decreased stride length;Decreased weight shift to right;Decreased stance time - right;Antalgic    Ambulation Surface  Unlevel;Indoor    Pre-Gait Activities  warm up walk in the parallel bars to ensure pt was not to sore to train with SPC since fall.                              Prosthetics   Prosthetic Care Comments   need to tighten suspension strap with pistoning which distal limb tenderness may indicate    Current prosthetic wear tolerance (days/week)   daily    Current prosthetic wear tolerance (#hours/day)   she reports most of awake hours    Education Provided  Residual limb care;Proper weight-bearing schedule/adjustment    Person(s) Educated  Patient    Education Method  Explanation;Demonstration    Education Method  Verbalized understanding    Donning Prosthesis  Minimal assist assisted pt in tightening strap of prosthesis          Balance Exercises - 02/16/18 0957      Balance Exercises: Standing   Standing Eyes Opened  Wide (BOA) weight shifts lateral, Upper trunk rotation        PT Education - 02/16/18 1229    Education provided  Yes    Education Details  Encouraged pt to consistently perform standing balance at home to progress with decreased UE support and increase residual limb weight bearing.  Edu pt to try increasing ply sock for discomfort from pressure with weight bearing and rotational movement with socket.   Person(s) Educated  Patient    Methods  Explanation;Demonstration;Verbal cues    Comprehension  Verbalized understanding;Returned demonstration       PT Short Term Goals - 01/31/18 1045      PT SHORT TERM GOAL #1   Title  Patient verbalizes & demonstrates proper donning with correct rotation.  (All STGs Target Date: 02/03/2018)    Baseline  MET 01/31/2018    Time  4    Period  Weeks    Status  Achieved      PT SHORT TERM GOAL #2   Title  Patient able to reach 5" & to floor with cane support with supervision.     Baseline  MET 01/31/2018    Time  4    Period  Weeks    Status  Achieved    Target Date  02/03/18      PT SHORT TERM GOAL #3   Title  Patient demonstrates standing ADLs like cooking in kitchen or laundry with prosthesis with supervision.     Baseline  MET 01/31/2018    Time  4    Period  Weeks    Status  Achieved    Target Date  02/03/18      PT SHORT TERM GOAL #4   Title  Patient ambulates 200' with cane & prosthesis with supervision.     Baseline  MET 01/31/2018-- required extra time, slow gait speed with SBSC     Time  4    Period  Weeks    Status  Achieved    Target Date  02/03/18      PT SHORT TERM GOAL #5   Title  Patient negotiates stairs (1 rails), ramps & curbs with cane & prosthesis with minimal assist    Baseline  MET 01/31/2018 MinA with SBSC on curb/ ramps    Time  4    Period  Weeks    Status  Achieved    Target Date  02/03/18        PT Long Term Goals - 01/05/18 1135      PT LONG TERM GOAL #1   Title  Patient verbalizes & demonstrates proper prosthetic care to enable safe use of prosthesis. (All LTGs Target Date: 03/04/2018)    Time  4    Period  Months    Status  On-going    Target Date  03/04/18      PT LONG TERM GOAL #2   Title  Patient tolerates wear of prosthesis >90% of awake hours without skin issues or limb pain  to enable function during her day.     Time  4    Period  Months    Status  On-going    Target Date  03/04/18      PT LONG TERM GOAL #3   Title  Patient ambulates household around furniture carrying items with cane or less & prosthesis modified independent.     Time  4    Period  Months    Status  On-going    Target Date  03/04/18      PT LONG TERM GOAL #4   Title  Berg Balance >/= 45/56 to indicate lower fall risk.     Time  4    Period  Months    Status  On-going    Target Date  03/04/18      PT LONG TERM GOAL #5   Title  Patient ambulates >1000' with LRAD & prosthesis outdoors including grass, ramps & curbs modified independent.  Time  4    Period  Months    Status  On-going    Target Date  03/04/18      PT LONG TERM GOAL #6   Title  Patient performs standing ADLs including cooking, laundry, sweeping, etc with prosthesis & LRAD modified indepedent.     Time  4    Period  Months    Status  On-going    Target Date  03/04/18            Plan - 02/16/18 1231    Clinical Impression Statement  Pt's had increased activity tolerance than last visit with less pain in left knee.  Pt continues to report discomfort/ squeezing/throbbing pain in residual limb with weight bearing in standing and gait (with SPC) requesting seated rest breaks to alleviate.  Pt plans to follow up with prosthetist about socket fitting and try increasing ply socks until then.          Rehab Potential  Good    Clinical Impairments Affecting Rehab Potential  right TFA, arthritis, CAD,A-Fib, PVD, CVA, Glaucoma, migraines, HTN, NSTEMI,  knee pain/left torn meniscus,     PT Frequency  2x / week    PT Duration  8 weeks    PT Treatment/Interventions  ADLs/Self Care Home Management;DME Instruction;Gait training;Stair training;Functional mobility training;Therapeutic activities;Therapeutic exercise;Balance training;Neuromuscular re-education;Patient/family education;Prosthetic Training    PT Next Visit Plan   work on curb training( stepping pattern. as well as scanning , carrying object tasks with gait to progress towards LTGs.    Consulted and Agree with Plan of Care  Patient       Patient will benefit from skilled therapeutic intervention in order to improve the following deficits and impairments:  Abnormal gait, Decreased activity tolerance, Decreased balance, Decreased endurance, Decreased knowledge of use of DME, Decreased mobility, Decreased strength, Prosthetic Dependency, Postural dysfunction  Visit Diagnosis: Other abnormalities of gait and mobility  Muscle weakness (generalized)  Unsteadiness on feet     Problem List Patient Active Problem List   Diagnosis Date Noted  . Phantom limb pain ([place])   . Hypertensive crisis   . Induration of skin   . Abnormality of gait   . Hypoalbuminemia due to protein-calorie malnutrition ([place])   . Amputee, above knee, right ([place]) 05/25/2017  . Unilateral AKA, right ([place])   . Elevated troponin   . PVD (peripheral vascular disease) ([place])   . Benign essential HTN   . Acute blood loss anemia   . Ischemia of right lower extremity 05/17/2017  . Thrombosis of right femoral artery ([place]) 01/09/2017  . Ischemia of extremity 01/09/2017  . History of colonic polyps 07/30/2016  . Essential hypertension 01/08/2016  . Polycythemia vera ([place]) 08/20/2015  . Leukocytosis 07/15/2015  . S/P arterial stent right SFA overlapping Bahn covered stents 06/27/2015 06/28/2015  . Claudication ([place]) 06/27/2015  . Carotid artery disease ([place]) 08/24/2014  . Osteoarthritis of left knee 03/27/2014  . External hemorrhoids 06/06/2013  . PAF (paroxysmal atrial fibrillation), maintaing SR 05/11/2013  . Chronic anticoagulation, new- eliquis 05/11/2013  . CAD (coronary artery disease), per cath 04/27/13, non obstructive disease 20-30% LM; LAD 30%; 50-60% OM2; RCA 40%; EF 65-70% 05/11/2013  . Mitral regurgitation,2+ by cath 05/11/2013  . Atrial fibrillation with RVR, hx of PAF  previously 04/27/2013  . PAD (peripheral artery disease), Lt carotid 50-70%: , Hx stent Lt exteral iliac & RSFA stent, known occl. LSFA  04/27/2013  . Hyperlipidemia 04/27/2013  . History of CVA (cerebrovascular accident)  04/27/2013  . NSTEMI (non-ST elevated myocardial infarction) ([place]) 04/27/2013    Bjorn Loser, PTA  02/16/18, 12:40 PM [facility] [address] [place], [place], [address] Phone: [phone]   Fax:  [phone]  Name: GRACELIN WEISBERG MRN: [id number] Date of Birth: 04/20/1951

## 2018-02-21 ENCOUNTER — Ambulatory Visit: Payer: Medicare Other | Attending: Physical Medicine & Rehabilitation | Admitting: Physical Therapy

## 2018-02-21 DIAGNOSIS — R2681 Unsteadiness on feet: Secondary | ICD-10-CM | POA: Diagnosis present

## 2018-02-21 DIAGNOSIS — M6281 Muscle weakness (generalized): Secondary | ICD-10-CM | POA: Insufficient documentation

## 2018-02-21 DIAGNOSIS — Z89611 Acquired absence of right leg above knee: Secondary | ICD-10-CM | POA: Diagnosis present

## 2018-02-21 DIAGNOSIS — R2689 Other abnormalities of gait and mobility: Secondary | ICD-10-CM

## 2018-02-21 DIAGNOSIS — R296 Repeated falls: Secondary | ICD-10-CM | POA: Insufficient documentation

## 2018-02-21 NOTE — Therapy (Signed)
[facility] [address] [place], [place], [address] Phone: [phone]   Fax:  [phone]  Physical Therapy Treatment  Patient Details  Name: [name] MRN: [id number] Date of Birth: [birth year] Referring Provider: Jamse Arn, MD   Encounter Date: 02/21/2018  PT End of Session - 02/21/18 1552    Visit Number  24    Number of Visits  29    Date for PT Re-Evaluation  03/04/18    Authorization Type  Medicare progress note every 77NH visit recert sent visit 13    PT Start Time  0845    PT Stop Time  0930    PT Time Calculation (min)  45 min    Equipment Utilized During Treatment  Gait belt    Activity Tolerance  Patient tolerated treatment well;Patient limited by fatigue;Patient limited by pain    Behavior During Therapy  [facility] for tasks assessed/performed       Past Medical History:  Diagnosis Date  . Arthritis    "fingers, some in my knees" (01/28/2016)  . CAD (coronary artery disease), per cath 04/27/13, non obstructive disease, EF 65-70% 05/11/2013  . Carotid disease, bilateral ([place]) 09/2013   Lt carotid 50-69% stentosis, less on rt  . Dysrhythmia   . Fatty liver   . GERD (gastroesophageal reflux disease)   . Glaucoma   . H/O cardiovascular stress test 12/27/2009   negative for ischemia  . Headache    "occasional since eye pressure regulated" (01/28/2016)  . Hyperlipidemia   . Hypertension   . Leukocytosis 07/15/2015  . Migraine    "stopped w/laser holes to relieve pressure in my eyes; had them 3-4 times/wk before taht" (01/28/2016)  . Mitral regurgitation,2+ by cath 05/11/2013  . NSTEMI (non-ST elevated myocardial infarction) ([place]) 04/27/2013  . Pain    BOTH KNEES - PT HAS TORN MENISCUS LEFT KNEE  . Paroxysmal atrial fibrillation (HCC)    hx of a fib on ASA  AND ELIQUIS   . Polycythemia vera ([place]) 08/20/2015   JAK-2 positive 07/19/15  . PVD (peripheral vascular disease) with claudication ([place]) 07/2006   previous L ext iliac stent and rt SFA stent, known occluded Lt SFA  . S/P cardiac cath 04/27/13   NON OBSTRUCTIVE DISEASE, 2+ mr, ef 65-70%  . Statin intolerance   . Stroke ([place]) 2006   SWELLING ALL OVER AND RT SIDE OF FACE DRAWN AND SPEECH SLURRED AND NUMBNESS ON RIGHT SIDE-- ALL RESOLVED    Past Surgical History:  Procedure Laterality Date  . AMPUTATION Right 05/21/2017   Procedure: AMPUTATION ABOVE KNEE;  Surgeon: Rosetta Posner, MD;  Location: [facility];  Service: Vascular;  Laterality: Right;  . CARDIAC CATHETERIZATION  04/27/13   non occlusive disease with mild to mod. calcified lesions in ostial LM and proximal LAD and moderate prox. RC AND DISTAL AV GROOVE LCX, ef  . CATARACT EXTRACTION W/PHACO Right 03/21/2015   Procedure: CATARACT EXTRACTION PHACO AND INTRAOCULAR LENS PLACEMENT RIGHT EYE;  Surgeon: Baruch Goldmann, MD;  [facility];  Service: Ophthalmology;  Laterality: Right;  CDE:5.10  . CATARACT EXTRACTION W/PHACO Left 05/16/2015   Procedure: CATARACT EXTRACTION PHACO AND INTRAOCULAR LENS PLACEMENT (IOC);  Surgeon: Baruch Goldmann, MD;  Location: [facility];  Service: Ophthalmology;  Laterality: Left;  CDE 5.57  . [place]  . COLONOSCOPY N/A 09/11/2016   Procedure: COLONOSCOPY;  Surgeon: Rogene [place], MD;  [facility];  Service: Endoscopy;  Laterality: N/A;  730-moved to 1:00 Ann notified pt  . EMBOLECTOMY Right 02/01/2016   Procedure: Thrombectomy of Right Femoral-Popliteal Bypass Graft; Endarterectomy of Right Below Knee Popliteal Artery and Tibial Peroneal Trunk with Bovine Pericardium Patch Angioplasty.;  Surgeon: Angelia Mould, MD;  Location: [facility];  Service: Vascular;  Laterality: Right;  . FEMORAL-POPLITEAL BYPASS GRAFT Right 01/31/2016   Procedure: BYPASS GRAFT RIGHT COMMON  FEMORALTO BELOW KNEE POPLITEAL ARTERY BYPASS GRAFT USING 6MM PROPATEN GORTEX GRAFT;  Surgeon: Mal Misty, MD;  Location: [facility];  Service: Vascular;  Laterality: Right;  .  FEMORAL-POPLITEAL BYPASS GRAFT Right 01/09/2017   Procedure: THROMBECTOMY OF RIGHT FEMORAL-POPLITEAL  ARTERY BYPASS GRAFT; THROMBECTOMY RIGHT  TIBIAL VESSELS;  Surgeon: [name], MD;  Location: [facility];  Service: Vascular;  Laterality: Right;  . FEMORAL-POPLITEAL BYPASS GRAFT Right 05/17/2017   Procedure: RIGHT FEMORAL-TIBIAL PERONEAL TRUNK ARTERY BYPASS GRAFT USING 47mX80cm PROPATEN GRAFT WITH REMOVABLE RING;  Surgeon: ERosetta Posner MD;  [place]  Service: Vascular;  Laterality: Right;  . FRACTURE SURGERY    . ILIAC ARTERY STENT  07/2006   external iliac and Rt SFA stent 07/2006 AND THE LEFT WAS IN 2006  . KNEE ARTHROSCOPY WITH MEDIAL MENISECTOMY Left 03/27/2014   Procedure: left knee arthorscopy with medial chondraplasty of the medial femoral and patella, medial microfracture technique of medial femoral condyl;  Surgeon: RTobi Bastos MD;  Location: [facility];  Service: Orthopedics;  Laterality: Left;  . LEFT HEART CATHETERIZATION WITH CORONARY ANGIOGRAM Right 04/27/2013   Procedure: LEFT HEART CATHETERIZATION WITH CORONARY ANGIOGRAM;  Surgeon: DLeonie Man MD;  [facility];  Service: Cardiovascular;  Laterality: Right;  . LOWER EXTREMITY ANGIOGRAM Right 01/31/2016   Procedure: INTRAOP RIGHT LOWER EXTREMITY ANGIOGRAM;  Surgeon: JMal Misty MD;  [place] [facility]  Service: Vascular;  Laterality: Right;  . ORIF WRIST FRACTURE Right 1983  . OVARIAN CYST REMOVAL Right   . PATCH ANGIOPLASTY Right 01/31/2016   Procedure: RIGHT COMMON FEMORAL AND PROFUNDA FEMORIS ENDARECTOMY WITH PATCH ANGIOPLASTY;  Surgeon: JMal Misty MD;  [place] [facility] [place]  Service: Vascular;  Laterality: Right;  . PATCH ANGIOPLASTY Right 01/09/2017   Procedure: PATCH ANGIOPLASTY RIGHT POPLITEAL ARTERY BYPASS GRAFT;  Surgeon: CElam Dutch MD;  [place] [facility]  Service: Vascular;  Laterality: Right;  . PERIPHERAL VASCULAR CATHETERIZATION N/A 06/27/2015   Procedure: Lower Extremity Angiography;  Surgeon:  JLorretta Harp MD;  [facility];  Service: Cardiovascular;  Laterality: N/A;  . PERIPHERAL VASCULAR CATHETERIZATION Bilateral 01/29/2016   Procedure: Lower Extremity Angiography;  Surgeon: MWellington [place] MD;  [facility];  Service: Cardiovascular;  Laterality: Bilateral;  . PERIPHERAL VASCULAR CATHETERIZATION N/A 01/29/2016   Procedure: Abdominal Aortogram;  Surgeon: MWellington [place] MD;  [facility];  Service: Cardiovascular;  Laterality: N/A;  . REFRACTIVE SURGERY Bilateral    "6 in one eye; 7 in the other; to relieve pressure; not glaucoma" (01/28/2016)  . SFA Right 06/27/2015   overlapping Bahn covered stents  . TUBAL LIGATION  1983  . VEIN HARVEST Left 05/17/2017   Procedure: LEFT LEG GREATER SAPHENOUS VEIN HARVEST;  Surgeon: ERosetta Posner MD;  [facility]  Service: Vascular;  Laterality: Left;  .Marland KitchenVEIN REPAIR Right 01/31/2016   Procedure: RIGHT GREATER SAPHENOUS VEIN EXAMNED BUT NOT REMOVED;  Surgeon: JMal Misty MD;  [facility]  Service: Vascular;  Laterality: Right;    There were no  vitals filed for this visit.  Subjective Assessment - 02/21/18 0844    Subjective  Today patient arrived using a Rolling walker rather than crutches pt reports that it was due to no particular reason. Pt reports no falls since last pt session. pt ambulates today with rolling walker. pt reports taking only a single gabapentin over the last week and is feeling much better. pt reports talking with pharmicist about how to wean of medication.     Pertinent History  right TFA, arthritis, CAD,A-Fib, PVD, CVA, Glaucoma, migraines, HTN, NSTEMI,  knee pain/left torn meniscus,     Limitations  Lifting;Standing;Walking    Patient Stated Goals  To use prosthesis walking. Previously 2-4 miles.     Currently in Pain?  No/denies                      [facility] Adult PT Treatment/Exercise - 02/21/18 0845      Transfers   Transfers  Sit to  Stand;Stand to Sit    Sit to Stand  5: Supervision;With upper extremity assist;With armrests;From chair/3-in-1;6: Modified independent (Device/Increase time)    Sit to Stand Details (indicate cue type and reason)  with rolling walker and quad tip cane    Stand to Sit  5: Supervision    Stand to Sit Details   with rolling walker and quad tip cane      Ambulation/Gait   Ambulation/Gait  Yes    Ambulation/Gait Assistance  4: Min guard    Ambulation/Gait Assistance Details  MinG for unsteady gait, pt require frequent standing and sitting rest breaks.     Ambulation Distance (Feet)  115 Feet 100x1,65x2,50x3    Assistive device  Straight cane;Prosthesis quad tip cane    Gait Pattern  Narrow base of support;Decreased stride length;Decreased weight shift to right;Decreased stance time - right;Antalgic;Step-to pattern    Ambulation Surface  Level;Indoor    Ramp  Other (comment) minG  due to slow speed & unsteady gait, quad tip & prosthes    Ramp Details (indicate cue type and reason)  verbal cueing to keep weight forward & upright posture  2x    Curb  4: Min assist cane quad tip & prosthesis    Curb Details (indicate cue type and reason)  3x PT demo, verbal cues for foot placement and step though     Gait Comments  Pt ambulates over 2 small blocks (3" tall 2"wide) up/down ramps, and curbs and scanning tasks. pt require multiple rest breaks due to muscular fatigue       High Level Balance   High Level Balance Activities  Direction changes;Turns;Head turns;Negotiating over obstacles;Negotitating around obstacles    High Level Balance Comments  pt require moderate verbal cues to keep eyes forward off ground and to scan environment      Self-Care   Self-Care  Lifting    Lifting  pick up small blocks off round (2x) supervision level      Prosthetics   Prosthetic Care Comments   pt reports she feels her hips are level when she stands with out shoe on her L foot. ( Pt assess iliac crest in standing with  both shoes on  and hips are level).     Current prosthetic wear tolerance (days/week)   daily    Current prosthetic wear tolerance (#hours/day)   she reports most of awake hours    Residual limb condition   intact per pt report  PT Education - 02/21/18 1541    Education provided  Yes    Education Details  medication weaning safety and proper adherence to MD and pharmasisit protocols     Person(s) Educated  Patient    Methods  Explanation    Comprehension  Verbalized understanding       PT Short Term Goals - 01/31/18 1045      PT SHORT TERM GOAL #1   Title  Patient verbalizes & demonstrates proper donning with correct rotation.  (All STGs Target Date: 02/03/2018)    Baseline  MET 01/31/2018    Time  4    Period  Weeks    Status  Achieved      PT SHORT TERM GOAL #2   Title  Patient able to reach 5" & to floor with cane support with supervision.     Baseline  MET 01/31/2018    Time  4    Period  Weeks    Status  Achieved    Target Date  02/03/18      PT SHORT TERM GOAL #3   Title  Patient demonstrates standing ADLs like cooking in kitchen or laundry with prosthesis with supervision.     Baseline  MET 01/31/2018    Time  4    Period  Weeks    Status  Achieved    Target Date  02/03/18      PT SHORT TERM GOAL #4   Title  Patient ambulates 200' with cane & prosthesis with supervision.     Baseline  MET 01/31/2018-- required extra time, slow gait speed with SBSC     Time  4    Period  Weeks    Status  Achieved    Target Date  02/03/18      PT SHORT TERM GOAL #5   Title  Patient negotiates stairs (1 rails), ramps & curbs with cane & prosthesis with minimal assist    Baseline  MET 01/31/2018 MinA with SBSC on curb/ ramps    Time  4    Period  Weeks    Status  Achieved    Target Date  02/03/18        PT Long Term Goals - 01/05/18 1135      PT LONG TERM GOAL #1   Title  Patient verbalizes & demonstrates proper prosthetic care to enable safe use of  prosthesis. (All LTGs Target Date: 03/04/2018)    Time  4    Period  Months    Status  On-going    Target Date  03/04/18      PT LONG TERM GOAL #2   Title  Patient tolerates wear of prosthesis >90% of awake hours without skin issues or limb pain to enable function during her day.     Time  4    Period  Months    Status  On-going    Target Date  03/04/18      PT LONG TERM GOAL #3   Title  Patient ambulates household around furniture carrying items with cane or less & prosthesis modified independent.     Time  4    Period  Months    Status  On-going    Target Date  03/04/18      PT LONG TERM GOAL #4   Title  Berg Balance >/= 45/56 to indicate lower fall risk.     Time  4    Period  Months    Status  On-going  Target Date  03/04/18      PT LONG TERM GOAL #5   Title  Patient ambulates >1000' with LRAD & prosthesis outdoors including grass, ramps & curbs modified independent.     Time  4    Period  Months    Status  On-going    Target Date  03/04/18      PT LONG TERM GOAL #6   Title  Patient performs standing ADLs including cooking, laundry, sweeping, etc with prosthesis & LRAD modified indepedent.     Time  4    Period  Months    Status  On-going    Target Date  03/04/18            Plan - 02/21/18 1600    Clinical Impression Statement  Today's pt session focused on gait with scanning tasks and ramp, curb, and small object negotiation. As session progressed patient demonstrated increased confidence with tasks. Pt demonstrated apprehension and increased time to complete curb negotiation most likely due to recent fall. Pt continues to require moderate verbal cueing to complete scanning task during gait and continues to look at her feet/ ground during ambulation. Pt will continue to benefit from skilled PT intervention to address issues reported above.      Clinical Impairments Affecting Rehab Potential  right TFA, arthritis, CAD,A-Fib, PVD, CVA, Glaucoma, migraines, HTN,  NSTEMI,  knee pain/left torn meniscus,     PT Frequency  2x / week    PT Duration  8 weeks    PT Treatment/Interventions  ADLs/Self Care Home Management;DME Instruction;Gait training;Stair training;Functional mobility training;Therapeutic activities;Therapeutic exercise;Balance training;Neuromuscular re-education;Patient/family education;Prosthetic Training    PT Next Visit Plan  continue to work on curb training and scanning gait tasks, in addition to increasing overall ambulation distance to progress towards LTGs    Consulted and Agree with Plan of Care  Patient       Patient will benefit from skilled therapeutic intervention in order to improve the following deficits and impairments:  Abnormal gait, Decreased activity tolerance, Decreased balance, Decreased endurance, Decreased knowledge of use of DME, Decreased mobility, Decreased strength, Prosthetic Dependency, Postural dysfunction  Visit Diagnosis: Other abnormalities of gait and mobility  Muscle weakness (generalized)  Unsteadiness on feet  Repeated falls     Problem List Patient Active Problem List   Diagnosis Date Noted  . Phantom limb pain ([place])   . Hypertensive crisis   . Induration of skin   . Abnormality of gait   . Hypoalbuminemia due to protein-calorie malnutrition ([place])   . Amputee, above knee, right ([place]) 05/25/2017  . Unilateral AKA, right ([place])   . Elevated troponin   . PVD (peripheral vascular disease) ([place])   . Benign essential HTN   . Acute blood loss anemia   . Ischemia of right lower extremity 05/17/2017  . Thrombosis of right femoral artery ([place]) 01/09/2017  . Ischemia of extremity 01/09/2017  . History of colonic polyps 07/30/2016  . Essential hypertension 01/08/2016  . Polycythemia vera ([place]) 08/20/2015  . Leukocytosis 07/15/2015  . S/P arterial stent right SFA overlapping Bahn covered stents 06/27/2015 06/28/2015  . Claudication ([place]) 06/27/2015  . Carotid artery disease ([place]) 08/24/2014  .  Osteoarthritis of left knee 03/27/2014  . External hemorrhoids 06/06/2013  . PAF (paroxysmal atrial fibrillation), maintaing SR 05/11/2013  . Chronic anticoagulation, new- eliquis 05/11/2013  . CAD (coronary artery disease), per cath 04/27/13, non obstructive disease 20-30% LM; LAD 30%; 50-60% OM2; RCA 40%; EF 65-70% 05/11/2013  .  Mitral regurgitation,2+ by cath 05/11/2013  . Atrial fibrillation with RVR, hx of PAF previously 04/27/2013  . PAD (peripheral artery disease), Lt carotid 50-70%: , Hx stent Lt exteral iliac & RSFA stent, known occl. LSFA  04/27/2013  . Hyperlipidemia 04/27/2013  . History of CVA (cerebrovascular accident) 04/27/2013  . NSTEMI (non-ST elevated myocardial infarction) ([place]) 04/27/2013   Waunita Schooner, SPT 02/21/2018, 4:36 PM  Jamey Reas, PT, DPT 02/21/2018, 4:46 PM  [facility] [address] [place], [place], [address] Phone: [phone]   Fax:  [phone]  Name: [name] MRN: [id number] Date of Birth: Apr 07, 1951

## 2018-02-23 ENCOUNTER — Encounter: Payer: Self-pay | Admitting: Physical Therapy

## 2018-02-23 ENCOUNTER — Ambulatory Visit: Payer: Medicare Other | Admitting: Physical Therapy

## 2018-02-23 DIAGNOSIS — M6281 Muscle weakness (generalized): Secondary | ICD-10-CM

## 2018-02-23 DIAGNOSIS — R2689 Other abnormalities of gait and mobility: Secondary | ICD-10-CM

## 2018-02-23 DIAGNOSIS — R296 Repeated falls: Secondary | ICD-10-CM

## 2018-02-23 DIAGNOSIS — R2681 Unsteadiness on feet: Secondary | ICD-10-CM

## 2018-02-23 NOTE — Therapy (Signed)
[facility] [address] [place], [place], [address] Phone: [phone]   Fax:  [phone]  Physical Therapy Treatment  Patient Details  Name: [name] MRN: [id number] Date of Birth: Dec 31, 1950 Referring Provider: Jamse Arn, MD   Encounter Date: 02/23/2018  PT End of Session - 02/23/18 1603    Visit Number  25    Number of Visits  29    Date for PT Re-Evaluation  03/04/18    Authorization Type  Medicare progress note every 25ZD visit recert sent visit 13    PT Start Time  0930    PT Stop Time  1013    PT Time Calculation (min)  43 min    Activity Tolerance  Patient tolerated treatment well;Patient limited by fatigue    Behavior During Therapy  [facility] for tasks assessed/performed       Past Medical History:  Diagnosis Date  . Arthritis    "fingers, some in my knees" (01/28/2016)  . CAD (coronary artery disease), per cath 04/27/13, non obstructive disease, EF 65-70% 05/11/2013  . Carotid disease, bilateral ([place]) 09/2013   Lt carotid 50-69% stentosis, less on rt  . Dysrhythmia   . Fatty liver   . GERD (gastroesophageal reflux disease)   . Glaucoma   . H/O cardiovascular stress test 12/27/2009   negative for ischemia  . Headache    "occasional since eye pressure regulated" (01/28/2016)  . Hyperlipidemia   . Hypertension   . Leukocytosis 07/15/2015  . Migraine    "stopped w/laser holes to relieve pressure in my eyes; had them 3-4 times/wk before taht" (01/28/2016)  . Mitral regurgitation,2+ by cath 05/11/2013  . NSTEMI (non-ST elevated myocardial infarction) ([place]) 04/27/2013  . Pain    BOTH KNEES - PT HAS TORN MENISCUS LEFT KNEE  . Paroxysmal atrial fibrillation (HCC)    hx of a fib on ASA  AND ELIQUIS   . Polycythemia vera ([place]) 08/20/2015   JAK-2 positive 07/19/15  . PVD (peripheral vascular disease) with claudication ([place]) 07/2006   previous L ext iliac stent and rt SFA stent, known occluded Lt SFA  . S/P  cardiac cath 04/27/13   NON OBSTRUCTIVE DISEASE, 2+ mr, ef 65-70%  . Statin intolerance   . Stroke ([place]) 2006   SWELLING ALL OVER AND RT SIDE OF FACE DRAWN AND SPEECH SLURRED AND NUMBNESS ON RIGHT SIDE-- ALL RESOLVED    Past Surgical History:  Procedure Laterality Date  . AMPUTATION Right 05/21/2017   Procedure: AMPUTATION ABOVE KNEE;  Surgeon: Rosetta Posner, MD;  Location: [facility];  Service: Vascular;  Laterality: Right;  . CARDIAC CATHETERIZATION  04/27/13   non occlusive disease with mild to mod. calcified lesions in ostial LM and proximal LAD and moderate prox. RC AND DISTAL AV GROOVE LCX, ef  . CATARACT EXTRACTION W/PHACO Right 03/21/2015   Procedure: CATARACT EXTRACTION PHACO AND INTRAOCULAR LENS PLACEMENT RIGHT EYE;  Surgeon: Baruch Goldmann, MD;  Location: [facility];  Service: Ophthalmology;  Laterality: Right;  CDE:5.10  . CATARACT EXTRACTION W/PHACO Left 05/16/2015   Procedure: CATARACT EXTRACTION PHACO AND INTRAOCULAR LENS PLACEMENT (IOC);  Surgeon: Baruch Goldmann, MD;  Location: [facility];  Service: Ophthalmology;  Laterality: Left;  CDE 5.57  . [place]  . COLONOSCOPY N/A 09/11/2016   Procedure: COLONOSCOPY;  Surgeon: Rogene Houston, MD;  [facility];  Service: Endoscopy;  Laterality: N/A;  730-moved to 1:00 Ann notified pt  . EMBOLECTOMY Right 02/01/2016  Procedure: Thrombectomy of Right Femoral-Popliteal Bypass Graft; Endarterectomy of Right Below Knee Popliteal Artery and Tibial Peroneal Trunk with Bovine Pericardium Patch Angioplasty.;  Surgeon: Angelia Mould, MD;  Location: [facility];  Service: Vascular;  Laterality: Right;  . FEMORAL-POPLITEAL BYPASS GRAFT Right 01/31/2016   Procedure: BYPASS GRAFT RIGHT COMMON  FEMORALTO BELOW KNEE POPLITEAL ARTERY BYPASS GRAFT USING 6MM PROPATEN GORTEX GRAFT;  Surgeon: Mal Misty, MD;  Location: [facility];  Service: Vascular;  Laterality: Right;  . FEMORAL-POPLITEAL BYPASS GRAFT Right 01/09/2017   Procedure: THROMBECTOMY OF  RIGHT FEMORAL-POPLITEAL  ARTERY BYPASS GRAFT; THROMBECTOMY RIGHT  TIBIAL VESSELS;  Surgeon: [name], MD;  Location: [facility];  Service: Vascular;  Laterality: Right;  . FEMORAL-POPLITEAL BYPASS GRAFT Right 05/17/2017   Procedure: RIGHT FEMORAL-TIBIAL PERONEAL TRUNK ARTERY BYPASS GRAFT USING 44mX80cm PROPATEN GRAFT WITH REMOVABLE RING;  Surgeon: ERosetta Posner MD;  [facility]  Service: Vascular;  Laterality: Right;  . FRACTURE SURGERY    . ILIAC ARTERY STENT  07/2006   external iliac and Rt SFA stent 07/2006 AND THE LEFT WAS IN 2006  . KNEE ARTHROSCOPY WITH MEDIAL MENISECTOMY Left 03/27/2014   Procedure: left knee arthorscopy with medial chondraplasty of the medial femoral and patella, medial microfracture technique of medial femoral condyl;  Surgeon: RTobi Bastos MD;  Location: [facility];  Service: Orthopedics;  Laterality: Left;  . LEFT HEART CATHETERIZATION WITH CORONARY ANGIOGRAM Right 04/27/2013   Procedure: LEFT HEART CATHETERIZATION WITH CORONARY ANGIOGRAM;  Surgeon: DLeonie Man MD;  [facility];  Service: Cardiovascular;  Laterality: Right;  . LOWER EXTREMITY ANGIOGRAM Right 01/31/2016   Procedure: INTRAOP RIGHT LOWER EXTREMITY ANGIOGRAM;  Surgeon: JMal Misty MD;  [facility]  Service: Vascular;  Laterality: Right;  . ORIF WRIST FRACTURE Right 1983  . OVARIAN CYST REMOVAL Right   . PATCH ANGIOPLASTY Right 01/31/2016   Procedure: RIGHT COMMON FEMORAL AND PROFUNDA FEMORIS ENDARECTOMY WITH PATCH ANGIOPLASTY;  Surgeon: JMal Misty MD;  [place] [facility]  Service: Vascular;  Laterality: Right;  . PATCH ANGIOPLASTY Right 01/09/2017   Procedure: PATCH ANGIOPLASTY RIGHT POPLITEAL ARTERY BYPASS GRAFT;  Surgeon: CElam Dutch MD;  [facility] [facility]  Service: Vascular;  Laterality: Right;  . PERIPHERAL VASCULAR CATHETERIZATION N/A 06/27/2015   Procedure: Lower Extremity Angiography;  Surgeon: JLorretta Harp MD;  [facility];  Service:  Cardiovascular;  Laterality: N/A;  . PERIPHERAL VASCULAR CATHETERIZATION Bilateral 01/29/2016   Procedure: Lower Extremity Angiography;  Surgeon: MWellington Hampshire MD;  [facility];  Service: Cardiovascular;  Laterality: Bilateral;  . PERIPHERAL VASCULAR CATHETERIZATION N/A 01/29/2016   Procedure: Abdominal Aortogram;  Surgeon: MWellington Hampshire MD;  [facility];  Service: Cardiovascular;  Laterality: N/A;  . REFRACTIVE SURGERY Bilateral    "6 in one eye; 7 in the other; to relieve pressure; not glaucoma" (01/28/2016)  . SFA Right 06/27/2015   overlapping Bahn covered stents  . TUBAL LIGATION  1983  . VEIN HARVEST Left 05/17/2017   Procedure: LEFT LEG GREATER SAPHENOUS VEIN HARVEST;  Surgeon: ERosetta Posner MD;  [facility]  Service: Vascular;  Laterality: Left;  .[place] KitchenVEIN REPAIR Right 01/31/2016   Procedure: RIGHT GREATER SAPHENOUS VEIN EXAMNED BUT NOT REMOVED;  Surgeon: JMal Misty MD;  [place] [facility]  Service: Vascular;  Laterality: Right;    There were no vitals filed for this visit.  Subjective Assessment - 02/23/18 03734  Subjective  pt reports no fall since last PT session.  pt reports she uses RW for long distance walking with shopping, cane in the home and the crutches for any other walking     Pertinent History  right TFA, arthritis, CAD,A-Fib, PVD, CVA, Glaucoma, migraines, HTN, NSTEMI,  knee pain/left torn meniscus,     Limitations  Lifting;Standing;Walking    Patient Stated Goals  To use prosthesis walking. Previously 2-4 miles.     Currently in Pain?  No/denies                      [facility] Adult PT Treatment/Exercise - 02/23/18 0930      Transfers   Transfers  Sit to Stand;Stand to Sit    Sit to Stand  5: Supervision;With upper extremity assist;With armrests;From chair/3-in-1;6: Modified independent (Device/Increase time)    Sit to Stand Details (indicate cue type and reason)  cane and crutches    Stand to Sit  5:  Supervision      Ambulation/Gait   Ambulation/Gait  Yes    Ambulation/Gait Assistance  5: Supervision;4: Min guard    Ambulation/Gait Assistance Details  Min G  with cane    Ambulation Distance (Feet)  115 Feet    Assistive device  Straight cane;Prosthesis    Gait Pattern  Narrow base of support;Decreased stride length;Decreased weight shift to right;Decreased stance time - right;Antalgic;Step-to pattern    Ambulation Surface  Level;Indoor    Ramp  Other (comment) MinG    Ramp Details (indicate cue type and reason)  Verbal cues for up right posture,weight shifting    Curb  4: Min assist    Curb Details (indicate cue type and reason)  Verbal cues for step through and min A( HHA)     Gait Comments  Pt ambulates 115 foot loop 4x  rest break between each.       High Level Balance   High Level Balance Activities  Turns;Sudden stops;Head turns;Negotitating around obstacles;Negotiating over obstacles    High Level Balance Comments  4x 115 ft loops with stepping over 3 small foam blocks, going up/down ramps and curbs, Over foam, picking up weighted single arm ball(Reaching into wast height tub) and carrying till end of task., and scanning room for corns. pt required seated reast break after each lap 4 laps       Self-Care   Self-Care  Lifting    Lifting  from ground different weight balls (1.0-6.6#) Verbal cues for foot placement      Prosthetics   Prosthetic Care Comments   pt reports no new changes    Current prosthetic wear tolerance (days/week)   daily    Current prosthetic wear tolerance (#hours/day)   she reports most of awake hours             PT Education - 02/23/18 1546    Education provided  Yes    Education Details  D/C plan and AD use at home and in community.        PT Short Term Goals - 01/31/18 1045      PT SHORT TERM GOAL #1   Title  Patient verbalizes & demonstrates proper donning with correct rotation.  (All STGs Target Date: 02/03/2018)    Baseline  MET  01/31/2018    Time  4    Period  Weeks    Status  Achieved      PT SHORT TERM GOAL #2   Title  Patient able  to reach 5" & to floor with cane support with supervision.     Baseline  MET 01/31/2018    Time  4    Period  Weeks    Status  Achieved    Target Date  02/03/18      PT SHORT TERM GOAL #3   Title  Patient demonstrates standing ADLs like cooking in kitchen or laundry with prosthesis with supervision.     Baseline  MET 01/31/2018    Time  4    Period  Weeks    Status  Achieved    Target Date  02/03/18      PT SHORT TERM GOAL #4   Title  Patient ambulates 200' with cane & prosthesis with supervision.     Baseline  MET 01/31/2018-- required extra time, slow gait speed with SBSC     Time  4    Period  Weeks    Status  Achieved    Target Date  02/03/18      PT SHORT TERM GOAL #5   Title  Patient negotiates stairs (1 rails), ramps & curbs with cane & prosthesis with minimal assist    Baseline  MET 01/31/2018 MinA with SBSC on curb/ ramps    Time  4    Period  Weeks    Status  Achieved    Target Date  02/03/18        PT Long Term Goals - 01/05/18 1135      PT LONG TERM GOAL #1   Title  Patient verbalizes & demonstrates proper prosthetic care to enable safe use of prosthesis. (All LTGs Target Date: 03/04/2018)    Time  4    Period  Months    Status  On-going    Target Date  03/04/18      PT LONG TERM GOAL #2   Title  Patient tolerates wear of prosthesis >90% of awake hours without skin issues or limb pain to enable function during her day.     Time  4    Period  Months    Status  On-going    Target Date  03/04/18      PT LONG TERM GOAL #3   Title  Patient ambulates household around furniture carrying items with cane or less & prosthesis modified independent.     Time  4    Period  Months    Status  On-going    Target Date  03/04/18      PT LONG TERM GOAL #4   Title  Berg Balance >/= 45/56 to indicate lower fall risk.     Time  4    Period  Months    Status   On-going    Target Date  03/04/18      PT LONG TERM GOAL #5   Title  Patient ambulates >1000' with LRAD & prosthesis outdoors including grass, ramps & curbs modified independent.     Time  4    Period  Months    Status  On-going    Target Date  03/04/18      PT LONG TERM GOAL #6   Title  Patient performs standing ADLs including cooking, laundry, sweeping, etc with prosthesis & LRAD modified indepedent.     Time  4    Period  Months    Status  On-going    Target Date  03/04/18            Plan - 02/23/18 1604  Clinical Impression Statement  During today's treatment session patient demonstrated significant increase in confidence with ramp and curb training requiring less verbal cues but still requiring HHA for both. Patient demonstrated good body mechanics when picking up weighted objects of the ground and continues to slowly progress endurance during PT sessions. Pt continues to have difficulty with scanning gait tasks and focuses her gaze on the ground in front of her due to fear of falling. Pt occasionally with catch her prosthesis and will need to take standing short rest break to collect herself prior to continuing with the task. Pt will continue to benefit from skilled PT intervention to reduce fall risk and address issues noted above.    Rehab Potential  Good    Clinical Impairments Affecting Rehab Potential  right TFA, arthritis, CAD,A-Fib, PVD, CVA, Glaucoma, migraines, HTN, NSTEMI,  knee pain/left torn meniscus,     PT Frequency  2x / week    PT Duration  8 weeks    PT Treatment/Interventions  ADLs/Self Care Home Management;DME Instruction;Gait training;Stair training;Functional mobility training;Therapeutic activities;Therapeutic exercise;Balance training;Neuromuscular re-education;Patient/family education;Prosthetic Training    PT Next Visit Plan  Continue to work on scanning gait tasks and progressing towards LTGs(03/04/2018)    Consulted and Agree with Plan of Care   Patient       Patient will benefit from skilled therapeutic intervention in order to improve the following deficits and impairments:  Abnormal gait, Decreased activity tolerance, Decreased balance, Decreased endurance, Decreased knowledge of use of DME, Decreased mobility, Decreased strength, Prosthetic Dependency, Postural dysfunction  Visit Diagnosis: Other abnormalities of gait and mobility  Muscle weakness (generalized)  Unsteadiness on feet  Repeated falls     Problem List Patient Active Problem List   Diagnosis Date Noted  . Phantom limb pain ([place])   . Hypertensive crisis   . Induration of skin   . Abnormality of gait   . Hypoalbuminemia due to protein-calorie malnutrition ([place])   . Amputee, above knee, right ([place]) 05/25/2017  . Unilateral AKA, right ([place])   . Elevated troponin   . PVD (peripheral vascular disease) ([place])   . Benign essential HTN   . Acute blood loss anemia   . Ischemia of right lower extremity 05/17/2017  . Thrombosis of right femoral artery ([place]) 01/09/2017  . Ischemia of extremity 01/09/2017  . History of colonic polyps 07/30/2016  . Essential hypertension 01/08/2016  . Polycythemia vera ([place]) 08/20/2015  . Leukocytosis 07/15/2015  . S/P arterial stent right SFA overlapping Bahn covered stents 06/27/2015 06/28/2015  . Claudication ([place]) 06/27/2015  . Carotid artery disease ([place]) 08/24/2014  . Osteoarthritis of left knee 03/27/2014  . External hemorrhoids 06/06/2013  . PAF (paroxysmal atrial fibrillation), maintaing SR 05/11/2013  . Chronic anticoagulation, new- eliquis 05/11/2013  . CAD (coronary artery disease), per cath 04/27/13, non obstructive disease 20-30% LM; LAD 30%; 50-60% OM2; RCA 40%; EF 65-70% 05/11/2013  . Mitral regurgitation,2+ by cath 05/11/2013  . Atrial fibrillation with RVR, hx of PAF previously 04/27/2013  . PAD (peripheral artery disease), Lt carotid 50-70%: , Hx stent Lt exteral iliac & RSFA stent, known occl. LSFA   04/27/2013  . Hyperlipidemia 04/27/2013  . History of CVA (cerebrovascular accident) 04/27/2013  . NSTEMI (non-ST elevated myocardial infarction) ([place]) 04/27/2013    Waunita Schooner SPT 02/23/2018, 4:21 PM  [facility] [address] [place], [place], [address] Phone: [phone]   Fax:  [phone]  Name: [name] MRN: [id number] Date of Birth:  06/19/1951

## 2018-02-28 ENCOUNTER — Ambulatory Visit: Payer: Medicare Other | Admitting: Physical Therapy

## 2018-02-28 ENCOUNTER — Encounter: Payer: Self-pay | Admitting: Physical Therapy

## 2018-02-28 ENCOUNTER — Other Ambulatory Visit: Payer: Self-pay

## 2018-02-28 ENCOUNTER — Telehealth: Payer: Self-pay | Admitting: Physical Therapy

## 2018-02-28 DIAGNOSIS — R2681 Unsteadiness on feet: Secondary | ICD-10-CM

## 2018-02-28 DIAGNOSIS — M79605 Pain in left leg: Secondary | ICD-10-CM

## 2018-02-28 DIAGNOSIS — R2689 Other abnormalities of gait and mobility: Secondary | ICD-10-CM | POA: Diagnosis not present

## 2018-02-28 DIAGNOSIS — R296 Repeated falls: Secondary | ICD-10-CM

## 2018-02-28 DIAGNOSIS — Z89611 Acquired absence of right leg above knee: Secondary | ICD-10-CM

## 2018-02-28 DIAGNOSIS — M6281 Muscle weakness (generalized): Secondary | ICD-10-CM

## 2018-02-28 NOTE — Therapy (Signed)
[facility] [address] [place], [place], [address] Phone: [phone]   Fax:  [phone]  Physical Therapy Treatment  Patient Details  Name: [name] MRN: [id number] Date of Birth: 18-Sep-1951 Referring Provider: Jamse Arn, MD   Encounter Date: 02/28/2018  PT End of Session - 02/28/18 1024    Visit Number  26    Number of Visits  29    Date for PT Re-Evaluation  03/04/18    Authorization Type  Medicare progress note every 74QV visit recert sent visit 13    PT Start Time  0930    PT Stop Time  1015    PT Time Calculation (min)  45 min    Activity Tolerance  Patient tolerated treatment well    Behavior During Therapy  [facility] for tasks assessed/performed       Past Medical History:  Diagnosis Date  . Arthritis    "fingers, some in my knees" (01/28/2016)  . CAD (coronary artery disease), per cath 04/27/13, non obstructive disease, EF 65-70% 05/11/2013  . Carotid disease, bilateral ([place]) 09/2013   Lt carotid 50-69% stentosis, less on rt  . Dysrhythmia   . Fatty liver   . GERD (gastroesophageal reflux disease)   . Glaucoma   . H/O cardiovascular stress test 12/27/2009   negative for ischemia  . Headache    "occasional since eye pressure regulated" (01/28/2016)  . Hyperlipidemia   . Hypertension   . Leukocytosis 07/15/2015  . Migraine    "stopped w/laser holes to relieve pressure in my eyes; had them 3-4 times/wk before taht" (01/28/2016)  . Mitral regurgitation,2+ by cath 05/11/2013  . NSTEMI (non-ST elevated myocardial infarction) ([place]) 04/27/2013  . Pain    BOTH KNEES - PT HAS TORN MENISCUS LEFT KNEE  . Paroxysmal atrial fibrillation (HCC)    hx of a fib on ASA  AND ELIQUIS   . Polycythemia vera ([place]) 08/20/2015   JAK-2 positive 07/19/15  . PVD (peripheral vascular disease) with claudication ([place]) 07/2006   previous L ext iliac stent and rt SFA stent, known occluded Lt SFA  . S/P cardiac cath 04/27/13   NON  OBSTRUCTIVE DISEASE, 2+ mr, ef 65-70%  . Statin intolerance   . Stroke ([place]) 2006   SWELLING ALL OVER AND RT SIDE OF FACE DRAWN AND SPEECH SLURRED AND NUMBNESS ON RIGHT SIDE-- ALL RESOLVED    Past Surgical History:  Procedure Laterality Date  . AMPUTATION Right 05/21/2017   Procedure: AMPUTATION ABOVE KNEE;  Surgeon: Rosetta Posner, MD;  Location: [facility];  Service: Vascular;  Laterality: Right;  . CARDIAC CATHETERIZATION  04/27/13   non occlusive disease with mild to mod. calcified lesions in ostial LM and proximal LAD and moderate prox. RC AND DISTAL AV GROOVE LCX, ef  . CATARACT EXTRACTION W/PHACO Right 03/21/2015   Procedure: CATARACT EXTRACTION PHACO AND INTRAOCULAR LENS PLACEMENT RIGHT EYE;  Surgeon: Baruch Goldmann, MD;  Location[facility];  Service: Ophthalmology;  Laterality: Right;  CDE:5.10  . CATARACT EXTRACTION W/PHACO Left 05/16/2015   Procedure: CATARACT EXTRACTION PHACO AND INTRAOCULAR LENS PLACEMENT (IOC);  Surgeon: Baruch Goldmann, MD;  Location: [facility];  Service: Ophthalmology;  Laterality: Left;  CDE 5.57  . [place]  . COLONOSCOPY N/A 09/11/2016   Procedure: COLONOSCOPY;  Surgeon: Rogene Houston, MD;  [facility];  Service: Endoscopy;  Laterality: N/A;  730-moved to 1:00 Ann notified pt  . EMBOLECTOMY Right 02/01/2016   Procedure:  Thrombectomy of Right Femoral-Popliteal Bypass Graft; Endarterectomy of Right Below Knee Popliteal Artery and Tibial Peroneal Trunk with Bovine Pericardium Patch Angioplasty.;  Surgeon: Angelia Mould, MD;  Location: [facility];  Service: Vascular;  Laterality: Right;  . FEMORAL-POPLITEAL BYPASS GRAFT Right 01/31/2016   Procedure: BYPASS GRAFT RIGHT COMMON  FEMORALTO BELOW KNEE POPLITEAL ARTERY BYPASS GRAFT USING 6MM PROPATEN GORTEX GRAFT;  Surgeon: Mal Misty, MD;  Location: [facility];  Service: Vascular;  Laterality: Right;  . FEMORAL-POPLITEAL BYPASS GRAFT Right 01/09/2017   Procedure: THROMBECTOMY OF RIGHT FEMORAL-POPLITEAL  ARTERY  BYPASS GRAFT; THROMBECTOMY RIGHT  TIBIAL VESSELS;  Surgeon: [name], MD;  Location: [facility];  Service: Vascular;  Laterality: Right;  . FEMORAL-POPLITEAL BYPASS GRAFT Right 05/17/2017   Procedure: RIGHT FEMORAL-TIBIAL PERONEAL TRUNK ARTERY BYPASS GRAFT USING 4mX80cm PROPATEN GRAFT WITH REMOVABLE RING;  Surgeon: ERosetta Posner MD;  [facility]  Service: Vascular;  Laterality: Right;  . FRACTURE SURGERY    . ILIAC ARTERY STENT  07/2006   external iliac and Rt SFA stent 07/2006 AND THE LEFT WAS IN 2006  . KNEE ARTHROSCOPY WITH MEDIAL MENISECTOMY Left 03/27/2014   Procedure: left knee arthorscopy with medial chondraplasty of the medial femoral and patella, medial microfracture technique of medial femoral condyl;  Surgeon: RTobi Bastos MD;  Location[facility];  Service: Orthopedics;  Laterality: Left;  . LEFT HEART CATHETERIZATION WITH CORONARY ANGIOGRAM Right 04/27/2013   Procedure: LEFT HEART CATHETERIZATION WITH CORONARY ANGIOGRAM;  Surgeon: DLeonie Man MD;  [facility];  Service: Cardiovascular;  Laterality: Right;  . LOWER EXTREMITY ANGIOGRAM Right 01/31/2016   Procedure: INTRAOP RIGHT LOWER EXTREMITY ANGIOGRAM;  Surgeon: JMal Misty MD;  [place] [facility]  Service: Vascular;  Laterality: Right;  . ORIF WRIST FRACTURE Right 1983  . OVARIAN CYST REMOVAL Right   . PATCH ANGIOPLASTY Right 01/31/2016   Procedure: RIGHT COMMON FEMORAL AND PROFUNDA FEMORIS ENDARECTOMY WITH PATCH ANGIOPLASTY;  Surgeon: JMal Misty MD;  [place] [facility]  Service: Vascular;  Laterality: Right;  . PATCH ANGIOPLASTY Right 01/09/2017   Procedure: PATCH ANGIOPLASTY RIGHT POPLITEAL ARTERY BYPASS GRAFT;  Surgeon: CElam Dutch MD;  [place] [facility]  Service: Vascular;  Laterality: Right;  . PERIPHERAL VASCULAR CATHETERIZATION N/A 06/27/2015   Procedure: Lower Extremity Angiography;  Surgeon: JLorretta Harp MD;  [facility];  Service: Cardiovascular;  Laterality: N/A;  .  PERIPHERAL VASCULAR CATHETERIZATION Bilateral 01/29/2016   Procedure: Lower Extremity Angiography;  Surgeon: MWellington Hampshire MD;  [facility];  Service: Cardiovascular;  Laterality: Bilateral;  . PERIPHERAL VASCULAR CATHETERIZATION N/A 01/29/2016   Procedure: Abdominal Aortogram;  Surgeon: MWellington Hampshire MD;  [facility];  Service: Cardiovascular;  Laterality: N/A;  . REFRACTIVE SURGERY Bilateral    "6 in one eye; 7 in the other; to relieve pressure; not glaucoma" (01/28/2016)  . SFA Right 06/27/2015   overlapping Bahn covered stents  . TUBAL LIGATION  1983  . VEIN HARVEST Left 05/17/2017   Procedure: LEFT LEG GREATER SAPHENOUS VEIN HARVEST;  Surgeon: ERosetta Posner MD;  [facility]  Service: Vascular;  Laterality: Left;  .Marland KitchenVEIN REPAIR Right 01/31/2016   Procedure: RIGHT GREATER SAPHENOUS VEIN EXAMNED BUT NOT REMOVED;  Surgeon: JMal Misty MD;  [facility]  Service: Vascular;  Laterality: Right;    There were no vitals filed for this visit.  Subjective Assessment - 02/28/18 1012  Subjective  pt reports no fall since last PT session. Pt reports she has been physically well but has been experiencing some family difficulties. a family member was admitted to the hospital last night. During sessoin pt report she has had a knot in her inner thigh with brusing and numbness below area. Pt reports no histroy of trauma associated with report.     Pertinent History  right TFA, arthritis, CAD,A-Fib, PVD, CVA, Glaucoma, migraines, HTN, NSTEMI,  knee pain/left torn meniscus,     Limitations  Lifting;Standing;Walking    Patient Stated Goals  To use prosthesis walking. Previously 2-4 miles.     Currently in Pain?  No/denies                      [facility] Adult PT Treatment/Exercise - 02/28/18 0930      Transfers   Transfers  Sit to Stand;Stand to Sit    Sit to Stand  5: Supervision    Sit to Stand Details (indicate cue type and reason)  crutches     Stand to Sit  5: Supervision    Stand to Sit Details  with Crutches supervision for safety       Ambulation/Gait   Ambulation/Gait  Yes    Ambulation/Gait Assistance  6: Modified independent (Device/Increase time)    Ambulation/Gait Assistance Details  Mod I with crutches     Ambulation Distance (Feet)  1001 Feet slow gait speed     Assistive device  Crutches    Gait Pattern  Narrow base of support;Decreased stride length;Decreased weight shift to right;Decreased stance time - right;Antalgic;Step-to pattern    Ambulation Surface  Level;Unlevel;Indoor;Outdoor;Paved;Grass    Stairs  Yes    Stairs Assistance Details (indicate cue type and reason)  crutches and single rail     Stair Management Technique  One rail Right;Step to pattern;With cane    Number of Stairs  4    Height of Stairs  6    Door Management  5: Supervision    Ramp  6: Modified independent (Device)  with crutches    Curb  6: Modified independent (Device/increase time) extra time with crutches       Prosthetics   Prosthetic Care Comments   pt reports no new changes    Current prosthetic wear tolerance (days/week)   daily    Current prosthetic wear tolerance (#hours/day)   90% of awake hours( 13.5 of 15 awake hours)    Current prosthetic weight-bearing tolerance (hours/day)   distal limb pain from increased walking              PT Education - 02/28/18 1018    Education provided  Yes    Education Details  PT goals and progression, D/C plan,  Prosthesis wear schedual, DVT risk factors and identification pt instructed to seek out medical attention As soon as possible,     Person(s) Educated  Patient    Methods  Explanation;Demonstration    Comprehension  Verbalized understanding       PT Short Term Goals - 01/31/18 1045      PT SHORT TERM GOAL #1   Title  Patient verbalizes & demonstrates proper donning with correct rotation.  (All STGs Target Date: 02/03/2018)    Baseline  MET 01/31/2018    Time  4     Period  Weeks    Status  Achieved      PT SHORT TERM GOAL #2   Title  Patient able to  reach 5" & to floor with cane support with supervision.     Baseline  MET 01/31/2018    Time  4    Period  Weeks    Status  Achieved    Target Date  02/03/18      PT SHORT TERM GOAL #3   Title  Patient demonstrates standing ADLs like cooking in kitchen or laundry with prosthesis with supervision.     Baseline  MET 01/31/2018    Time  4    Period  Weeks    Status  Achieved    Target Date  02/03/18      PT SHORT TERM GOAL #4   Title  Patient ambulates 200' with cane & prosthesis with supervision.     Baseline  MET 01/31/2018-- required extra time, slow gait speed with SBSC     Time  4    Period  Weeks    Status  Achieved    Target Date  02/03/18      PT SHORT TERM GOAL #5   Title  Patient negotiates stairs (1 rails), ramps & curbs with cane & prosthesis with minimal assist    Baseline  MET 01/31/2018 MinA with SBSC on curb/ ramps    Time  4    Period  Weeks    Status  Achieved    Target Date  02/03/18        PT Long Term Goals - 02/28/18 1024      PT LONG TERM GOAL #1   Title  Patient verbalizes & demonstrates proper prosthetic care to enable safe use of prosthesis. (All LTGs Target Date: 03/04/2018)    Baseline  MET(3/11)     Status  Achieved      PT LONG TERM GOAL #2   Title  Patient tolerates wear of prosthesis >90% of awake hours without skin issues or limb pain to enable function during her day.     Baseline  MET (3/11) 13.5 hours of 15 awake hours (90%)( weekend and weekdays))    Status  Achieved      PT LONG TERM GOAL #3   Title  Patient ambulates household around furniture carrying items with cane or less & prosthesis modified independent.     Status  On-going      PT LONG TERM GOAL #4   Title  Berg Balance >/= 45/56 to indicate lower fall risk.     Status  On-going      PT LONG TERM GOAL #5   Title  Patient ambulates >1000' with LRAD & prosthesis outdoors including grass,  ramps & curbs modified independent.     Baseline  MET(3/11) 1001 feet with crutches MOD I    Status  Achieved      PT LONG TERM GOAL #6   Title  Patient performs standing ADLs including cooking, laundry, sweeping, etc with prosthesis & LRAD modified indepedent.     Status  On-going            Plan - 02/28/18 1426    Clinical Impression Statement  During today's PT session PT met 3/6 Long term goals. Pt has improved her prosthetic wear time to 90% of her awake hours. Verbalized prosthetic care and ambulated with bilateral crutches for 1,001 Feet utilizing a 4 point gait pattern and reduced gait speed. PT contacted patient's vascular physician office about concerns of possible DVT development in patient L upper inner thigh. After patient convey concern about small painful "knot" in inner thigh  with brushing running up her thigh in the same area with no additional history of trauma. " knot" a firm pea sized mass palpable over pateints jeans. Patient has been instructed to seek medical attention as soon as she can (today). Patient vascular physician office informed PT they would schedule and call pt this afternoon.     Rehab Potential  Good    Clinical Impairments Affecting Rehab Potential  right TFA, arthritis, CAD,A-Fib, PVD, CVA, Glaucoma, migraines, HTN, NSTEMI,  knee pain/left torn meniscus,     PT Frequency  2x / week    PT Duration  8 weeks    PT Treatment/Interventions  ADLs/Self Care Home Management;DME Instruction;Gait training;Stair training;Functional mobility training;Therapeutic activities;Therapeutic exercise;Balance training;Neuromuscular re-education;Patient/family education;Prosthetic Training    PT Next Visit Plan  Follow up on Status of possible DVT, continue to check remaining LTGs if pt appropriate for PT    Consulted and Agree with Plan of Care  Patient       Patient will benefit from skilled therapeutic intervention in order to improve the following deficits and  impairments:  Abnormal gait, Decreased activity tolerance, Decreased balance, Decreased endurance, Decreased knowledge of use of DME, Decreased mobility, Decreased strength, Prosthetic Dependency, Postural dysfunction  Visit Diagnosis: Other abnormalities of gait and mobility  Muscle weakness (generalized)  Unsteadiness on feet  Repeated falls  Amputee, above knee, right [facility])     Problem List Patient Active Problem List   Diagnosis Date Noted  . Phantom limb pain ([place])   . Hypertensive crisis   . Induration of skin   . Abnormality of gait   . Hypoalbuminemia due to protein-calorie malnutrition ([place])   . Amputee, above knee, right ([place]) 05/25/2017  . Unilateral AKA, right ([place])   . Elevated troponin   . PVD (peripheral vascular disease) ([place])   . Benign essential HTN   . Acute blood loss anemia   . Ischemia of right lower extremity 05/17/2017  . Thrombosis of right femoral artery ([place]) 01/09/2017  . Ischemia of extremity 01/09/2017  . History of colonic polyps 07/30/2016  . Essential hypertension 01/08/2016  . Polycythemia vera ([place]) 08/20/2015  . Leukocytosis 07/15/2015  . S/P arterial stent right SFA overlapping Bahn covered stents 06/27/2015 06/28/2015  . Claudication ([place]) 06/27/2015  . Carotid artery disease ([place]) 08/24/2014  . Osteoarthritis of left knee 03/27/2014  . External hemorrhoids 06/06/2013  . PAF (paroxysmal atrial fibrillation), maintaing SR 05/11/2013  . Chronic anticoagulation, new- eliquis 05/11/2013  . CAD (coronary artery disease), per cath 04/27/13, non obstructive disease 20-30% LM; LAD 30%; 50-60% OM2; RCA 40%; EF 65-70% 05/11/2013  . Mitral regurgitation,2+ by cath 05/11/2013  . Atrial fibrillation with RVR, hx of PAF previously 04/27/2013  . PAD (peripheral artery disease), Lt carotid 50-70%: , Hx stent Lt exteral iliac & RSFA stent, known occl. LSFA  04/27/2013  . Hyperlipidemia 04/27/2013  . History of CVA (cerebrovascular accident) 04/27/2013   . NSTEMI (non-ST elevated myocardial infarction) ([place]) 04/27/2013   Waunita Schooner SPT 02/28/2018, 2:41 PM  WALDRON,ROBIN  PT, DPT 02/28/2018, 4:08 PM  [facility] [address] [place], [place], [address] Phone: [phone]   Fax:  [phone]  Name: ELVIE PALOMO MRN: [id number] Date of Birth: 1950/12/30

## 2018-02-28 NOTE — Telephone Encounter (Signed)
Dr. Donnetta Hutching (cc. Katy Apo) Ms. Mayor reported that her non-amputated left thigh had a knot and bruising along the inside thigh. She reports no trauma with this bruising. I was concerned with possible DVT or vascular issue. So I called your office this morning to request an appointment and the receptionist sent me to voicemail with scheduling promising that they would call Ms. Bogacz back to set up an appointment. I checked at 4:00pm this afternoon and did not see an appointment scheduled yet. Katy Apo I am sending this telephone encounter in hopes that you can investigate to see where we are with getting her an appointment. Thank you for your assistance Jamey Reas, PT, DPT PT Specializing in [place] 02/28/2018@ 4:04 PM Phone:  [phone]  Fax:  [phone] [facility] [address] [place], [place] [address]

## 2018-03-01 ENCOUNTER — Encounter: Payer: Self-pay | Admitting: Vascular Surgery

## 2018-03-01 ENCOUNTER — Ambulatory Visit (HOSPITAL_COMMUNITY)
Admission: RE | Admit: 2018-03-01 | Discharge: 2018-03-01 | Disposition: A | Discharge status: Home / Self Care | Payer: Medicare Other | Source: Ambulatory Visit | Attending: Vascular Surgery | Admitting: Vascular Surgery

## 2018-03-01 ENCOUNTER — Ambulatory Visit (INDEPENDENT_AMBULATORY_CARE_PROVIDER_SITE_OTHER): Payer: Medicare Other | Admitting: Vascular Surgery

## 2018-03-01 VITALS — BP 169/71 | HR 69 | Temp 97.7°F | Resp 16 | Ht 65.5 in | Wt 131.4 lb

## 2018-03-01 DIAGNOSIS — M79605 Pain in left leg: Secondary | ICD-10-CM | POA: Diagnosis not present

## 2018-03-01 DIAGNOSIS — I739 Peripheral vascular disease, unspecified: Secondary | ICD-10-CM | POA: Diagnosis not present

## 2018-03-01 NOTE — Progress Notes (Signed)
[facility]  Patient name: [name] MRN: [id number] DOB: 06/25/1951 Sex: female  REASON FOR VISIT: Evaluation of swelling and bruising left thigh  HPI: [name] is a 67 y.o. female here today for evaluation.  Is concerned regarding possible deep venous thrombosis.  She is well-known to our practice with prior above-knee amputation on the right for unreconstructable vascular disease.  She does have known hypercoagulable state.  She reports that proximally 1 week ago she had episode of firm knot in her mid thigh.  No pain associated with this.  Several days later she noted bruising.  No increased swelling.  She has been doing quite well with her above-knee prosthesis and walked today with a walker with no difficulty  Past Medical History:  Diagnosis Date  . Arthritis    "fingers, some in my knees" (01/28/2016)  . CAD (coronary artery disease), per cath 04/27/13, non obstructive disease, EF 65-70% 05/11/2013  . Carotid disease, bilateral ([place]) 09/2013   Lt carotid 50-69% stentosis, less on rt  . Dysrhythmia   . Fatty liver   . GERD (gastroesophageal reflux disease)   . Glaucoma   . H/O cardiovascular stress test 12/27/2009   negative for ischemia  . Headache    "occasional since eye pressure regulated" (01/28/2016)  . Hyperlipidemia   . Hypertension   . Leukocytosis 07/15/2015  . Migraine    "stopped w/laser holes to relieve pressure in my eyes; had them 3-4 times/wk before taht" (01/28/2016)  . Mitral regurgitation,2+ by cath 05/11/2013  . NSTEMI (non-ST elevated myocardial infarction) ([place]) 04/27/2013  . Pain    BOTH KNEES - PT HAS TORN MENISCUS LEFT KNEE  . Paroxysmal atrial fibrillation (HCC)    hx of a fib on ASA  AND ELIQUIS   . Polycythemia vera ([place]) 08/20/2015   JAK-2 positive 07/19/15  . PVD (peripheral vascular disease) with claudication ([place]) 07/2006   previous L ext iliac stent and rt SFA stent, known  occluded Lt SFA  . S/P cardiac cath 04/27/13   NON OBSTRUCTIVE DISEASE, 2+ mr, ef 65-70%  . Statin intolerance   . Stroke ([place]) 2006   SWELLING ALL OVER AND RT SIDE OF FACE DRAWN AND SPEECH SLURRED AND NUMBNESS ON RIGHT SIDE-- ALL RESOLVED    Family History  Problem Relation Age of Onset  . CAD Mother   . Lung cancer Mother   . Heart attack Mother   . CAD Father   . Heart disease Father   . Heart attack Father 2       died in hes 2's with heart disease  . Healthy Sister   . Liver cancer Brother   . Healthy Sister   . CAD Brother        has had CABG  . CAD Brother     SOCIAL HISTORY: Social History   Tobacco Use  . Smoking status: Former Smoker    Packs/day: 1.00    Years: 45.00    Pack years: 45.00    Types: Cigarettes    Last attempt to quit: 11/11/2012    Years since quitting: 5.3  . Smokeless tobacco: Never Used  . Tobacco comment: quit 4 years ago  Substance Use Topics  . Alcohol use: No    Alcohol/week: 0.0 oz    Allergies  Allergen Reactions  . Atenolol Rash and Itching  . Demerol  [Meperidine Hcl] Itching  . Demerol [Meperidine] Other (See Comments) and Itching  Unknown, can't remember  Unknown, can't remember   . Gabapentin Anxiety  . Inderal [Propranolol] Other (See Comments) and Itching    Doesn't remember   . Prednisone Other (See Comments)    Muscle spasms  . Statins Itching and Other (See Comments)    Simvastatin-caused severe itching Pravastatin-caused lesser tiching One type of statin? Caused stroke in 2006, and other symptoms as result  . Warfarin And Related Other (See Comments)    Caused nose bleeds  . Crestor [Rosuvastatin] Itching and Rash  . Docosahexaenoic Acid (Dha)     Other reaction(s): Other (See Comments) nosebleeds  . Lactose Intolerance (Gi) Other (See Comments)    Bloating, gas  . Lipitor [Atorvastatin] Rash  . Lovaza [Omega-3-Acid Ethyl Esters] Other (See Comments)    nosebleeds   . Penicillins Hives, Rash and  Other (See Comments)    Has patient had a PCN reaction causing immediate rash, facial/tongue/throat swelling, SOB or lightheadedness with hypotension: Yes Has patient had a PCN reaction causing severe rash involving mucus membranes or skin necrosis: No Has patient had a PCN reaction that required hospitalization: No Has patient had a PCN reaction occurring within the last 10 years: No If all of the above answers are "NO", then may proceed with Cephalosporin use.  Questionable high fever   . Pravastatin Itching and Rash  . Simvastatin Itching, Rash and Other (See Comments)  . Trilipix [Choline Fenofibrate] Other (See Comments)    Doesn't remember   . Warfarin Other (See Comments)    nosebleeds  . Hydralazine Swelling  . Effexor [Venlafaxine Hcl] Other (See Comments)    Doesn't remember   . Nexium [Esomeprazole Magnesium] Rash  . Percocet [Oxycodone-Acetaminophen] Other (See Comments)    Doesn't remember   . Plavix [Clopidogrel Bisulfate] Rash    Current Outpatient Medications  Medication Sig Dispense Refill  . acetaminophen (TYLENOL) 500 MG tablet Take 1,000 mg by mouth every 6 (six) hours as needed for moderate pain.     Marland Kitchen apixaban (ELIQUIS) 5 MG TABS tablet Take 1 tablet (5 mg total) by mouth 2 (two) times daily. 60 tablet 1  . apixaban (ELIQUIS) 5 MG TABS tablet Take 5 mg by mouth.    . ARTIFICIAL TEAR OP Place 1 drop into both eyes as needed (for dry eyes).    Marland Kitchen aspirin EC 325 MG tablet Take 325 mg by mouth daily.    Marland Kitchen aspirin EC 325 MG tablet Take 325 mg by mouth.    . cyclobenzaprine (FLEXERIL) 5 MG tablet Take one tablet by mouth 3 (three) times a day as neededfor Muscle spasms.    . diclofenac sodium (VOLTAREN) 1 % GEL Apply 2 g topically 4 (four) times daily. 1 Tube 1  . diclofenac sodium (VOLTAREN) 1 % GEL 2 g.    . diltiazem (CARDIZEM CD) 180 MG 24 hr capsule Take 1 capsule (180 mg total) by mouth every morning. 30 capsule 1  . diltiazem (CARDIZEM CD) 180 MG 24 hr capsule  TAKE (1) CAPSULE DAILY    . docusate sodium (COLACE) 100 MG capsule Take 200 mg by mouth every evening.     . docusate sodium (COLACE) 100 MG capsule Take 200 mg by mouth.    . ezetimibe (ZETIA) 10 MG tablet Take 1 tablet (10 mg total) by mouth at bedtime. PLEASE CONTACT OFFICE FOR ADDITIONAL REFILLS 30 tablet 0  . ezetimibe (ZETIA) 10 MG tablet Take 10 mg by mouth.    . fenofibrate (TRICOR) 145 MG tablet  Take 1 tablet (145 mg total) by mouth at bedtime. PLEASE CONTACT OFFICE FOR ADDITIONAL REFILLS 7 tablet 0  . fenofibrate (TRICOR) 145 MG tablet Take 145 mg by mouth.    . ferrous sulfate 325 (65 FE) MG tablet Take 325 mg by mouth every evening.    . flecainide (TAMBOCOR) 50 MG tablet Take 1 tablet (50 mg total) by mouth 2 (two) times daily. PLEASE CONTACT OFFICE FOR ADDITIONAL REFILLS 60 tablet 0  . flecainide (TAMBOCOR) 50 MG tablet Take 50 mg by mouth.    . gabapentin (NEURONTIN) 100 MG capsule Take 2 capsules (200 mg total) by mouth 3 (three) times daily. 180 capsule 2  . hydrALAZINE (APRESOLINE) 25 MG tablet Take 25 mg by mouth 2 (two) times daily.    . hydrALAZINE (APRESOLINE) 25 MG tablet Take 25 mg by mouth.    Marland Kitchen lisinopril (PRINIVIL,ZESTRIL) 20 MG tablet Take 1 tablet (20 mg total) by mouth 2 (two) times daily. 60 tablet 0  . lisinopril (PRINIVIL,ZESTRIL) 20 MG tablet Take by mouth.    Marland Kitchen omeprazole (PRILOSEC) 20 MG capsule Take 1 capsule (20 mg total) by mouth 2 (two) times daily before a meal. 60 capsule 1  . omeprazole (PRILOSEC) 20 MG capsule TAKE (1) CAPSULE TWICE DAILY.    . traMADol (ULTRAM) 50 MG tablet Take 1 tablet (50 mg total) by mouth every 6 (six) hours as needed. Currently: taking med between 2-3 times daily for pain 60 tablet 0  . traMADol (ULTRAM) 50 MG tablet TAKE (1) TABLET EVERY FOUR HOURS AS NEEDED FOR PAIN. - MAY MAKE DROWSY -     No current facility-administered medications for this visit.     REVIEW OF SYSTEMS:  [X]  denotes positive finding, [ ]  denotes  negative finding Cardiac  Comments:  Chest pain or chest pressure:    Shortness of breath upon exertion:    Short of breath when lying flat:    Irregular heart rhythm:        Vascular    Pain in calf, thigh, or hip brought on by ambulation:    Pain in feet at night that wakes you up from your sleep:     Blood clot in your veins:    Leg swelling:  x         PHYSICAL EXAM: Vitals:   03/01/18 1224 03/01/18 1226  BP: (!) 177/79 (!) 169/71  Pulse: 69   Resp: 16   Temp: 97.7 F (36.5 C)   TempSrc: Oral   SpO2: 99%   Weight: 131 lb 6.4 oz (59.6 kg)   Height: 5' 5.5" (1.664 m)     GENERAL: The patient is a well-nourished female, in no acute distress. The vital signs are documented above. CARDIOVASCULAR: Left foot is well perfused.  I do not palpate pedal pulses.  She does have bruising on her medial thigh and what feels to be a hematoma approximately 1-1-1/2 cm in her medial thigh. PULMONARY: There is good air exchange  MUSCULOSKELETAL: There are no major deformities or cyanosis. NEUROLOGIC: No focal weakness or paresthesias are detected. SKIN: There are no ulcers or rashes noted. PSYCHIATRIC: The patient has a normal affect.  DATA:  Duplex today showed no evidence of DVT.  I image this area specifically with SonoSite this does appear to have a hematoma present  MEDICAL ISSUES: Spontaneous hematoma in her medial thigh.  Is resolving.  No evidence of DVT.  Would not make any changes regarding anticoagulation and reassurance only.  Will  see [place] again on an as-needed basis    Rosetta Posner, MD [facility] Tel [phone] Pager [phone]

## 2018-03-02 ENCOUNTER — Ambulatory Visit: Payer: Medicare Other | Admitting: Physical Therapy

## 2018-03-02 ENCOUNTER — Encounter: Payer: Self-pay | Admitting: Physical Therapy

## 2018-03-02 DIAGNOSIS — R296 Repeated falls: Secondary | ICD-10-CM

## 2018-03-02 DIAGNOSIS — Z89611 Acquired absence of right leg above knee: Secondary | ICD-10-CM

## 2018-03-02 DIAGNOSIS — R2689 Other abnormalities of gait and mobility: Secondary | ICD-10-CM

## 2018-03-02 DIAGNOSIS — M6281 Muscle weakness (generalized): Secondary | ICD-10-CM

## 2018-03-02 DIAGNOSIS — R2681 Unsteadiness on feet: Secondary | ICD-10-CM

## 2018-03-02 NOTE — Therapy (Signed)
[facility] [address] [place], [place], [address] Phone: [phone]   Fax:  [phone]  Physical Therapy Treatment  Patient Details  Name: [name] MRN: [id number] Date of Birth: 1951-08-12 Referring Provider: Jamse Arn, MD   Encounter Date: 03/02/2018  PT End of Session - 03/02/18 1022    Visit Number  27    Number of Visits  29    Date for PT Re-Evaluation  03/04/18    Authorization Type  Medicare progress note every 33LK visit recert sent visit 13    PT Start Time  0930    PT Stop Time  1015    PT Time Calculation (min)  45 min    Equipment Utilized During Treatment  Gait belt    Activity Tolerance  Patient tolerated treatment well    Behavior During Therapy  [facility] for tasks assessed/performed       Past Medical History:  Diagnosis Date  . Arthritis    "fingers, some in my knees" (01/28/2016)  . CAD (coronary artery disease), per cath 04/27/13, non obstructive disease, EF 65-70% 05/11/2013  . Carotid disease, bilateral ([place]) 09/2013   Lt carotid 50-69% stentosis, less on rt  . Dysrhythmia   . Fatty liver   . GERD (gastroesophageal reflux disease)   . Glaucoma   . H/O cardiovascular stress test 12/27/2009   negative for ischemia  . Headache    "occasional since eye pressure regulated" (01/28/2016)  . Hyperlipidemia   . Hypertension   . Leukocytosis 07/15/2015  . Migraine    "stopped w/laser holes to relieve pressure in my eyes; had them 3-4 times/wk before taht" (01/28/2016)  . Mitral regurgitation,2+ by cath 05/11/2013  . NSTEMI (non-ST elevated myocardial infarction) ([place]) 04/27/2013  . Pain    BOTH KNEES - PT HAS TORN MENISCUS LEFT KNEE  . Paroxysmal atrial fibrillation (HCC)    hx of a fib on ASA  AND ELIQUIS   . Polycythemia vera ([place]) 08/20/2015   JAK-2 positive 07/19/15  . PVD (peripheral vascular disease) with claudication ([place]) 07/2006   previous L ext iliac stent and rt SFA stent, known  occluded Lt SFA  . S/P cardiac cath 04/27/13   NON OBSTRUCTIVE DISEASE, 2+ mr, ef 65-70%  . Statin intolerance   . Stroke ([place]) 2006   SWELLING ALL OVER AND RT SIDE OF FACE DRAWN AND SPEECH SLURRED AND NUMBNESS ON RIGHT SIDE-- ALL RESOLVED    Past Surgical History:  Procedure Laterality Date  . AMPUTATION Right 05/21/2017   Procedure: AMPUTATION ABOVE KNEE;  Surgeon: Rosetta Posner, MD;  Location: [facility];  Service: Vascular;  Laterality: Right;  . CARDIAC CATHETERIZATION  04/27/13   non occlusive disease with mild to mod. calcified lesions in ostial LM and proximal LAD and moderate prox. RC AND DISTAL AV GROOVE LCX, ef  . CATARACT EXTRACTION W/PHACO Right 03/21/2015   Procedure: CATARACT EXTRACTION PHACO AND INTRAOCULAR LENS PLACEMENT RIGHT EYE;  Surgeon: Baruch Goldmann, MD;  [facility]: [facility];  Service: Ophthalmology;  Laterality: Right;  CDE:5.10  . CATARACT EXTRACTION W/PHACO Left 05/16/2015   Procedure: CATARACT EXTRACTION PHACO AND INTRAOCULAR LENS PLACEMENT (IOC);  Surgeon: Baruch Goldmann, MD;  Location: [facility];  Service: Ophthalmology;  Laterality: Left;  CDE 5.57  . [place]  . COLONOSCOPY N/A 09/11/2016   Procedure: COLONOSCOPY;  Surgeon: Rogene Houston, MD;  [facility];  Service: Endoscopy;  Laterality: N/A;  730-moved to 1:00 Lelon Frohlich  notified pt  . EMBOLECTOMY Right 02/01/2016   Procedure: Thrombectomy of Right Femoral-Popliteal Bypass Graft; Endarterectomy of Right Below Knee Popliteal Artery and Tibial Peroneal Trunk with Bovine Pericardium Patch Angioplasty.;  Surgeon: Angelia Mould, MD;  Location: [facility];  Service: Vascular;  Laterality: Right;  . FEMORAL-POPLITEAL BYPASS GRAFT Right 01/31/2016   Procedure: BYPASS GRAFT RIGHT COMMON  FEMORALTO BELOW KNEE POPLITEAL ARTERY BYPASS GRAFT USING 6MM PROPATEN GORTEX GRAFT;  Surgeon: Mal Misty, MD;  Location: [facility];  Service: Vascular;  Laterality: Right;  . FEMORAL-POPLITEAL BYPASS GRAFT Right 01/09/2017    Procedure: THROMBECTOMY OF RIGHT FEMORAL-POPLITEAL  ARTERY BYPASS GRAFT; THROMBECTOMY RIGHT  TIBIAL VESSELS;  Surgeon: [name], MD;  Location: [facility];  Service: Vascular;  Laterality: Right;  . FEMORAL-POPLITEAL BYPASS GRAFT Right 05/17/2017   Procedure: RIGHT FEMORAL-TIBIAL PERONEAL TRUNK ARTERY BYPASS GRAFT USING 62mX80cm PROPATEN GRAFT WITH REMOVABLE RING;  Surgeon: ERosetta Posner MD;  [place] [facility]  Service: Vascular;  Laterality: Right;  . FRACTURE SURGERY    . ILIAC ARTERY STENT  07/2006   external iliac and Rt SFA stent 07/2006 AND THE LEFT WAS IN 2006  . KNEE ARTHROSCOPY WITH MEDIAL MENISECTOMY Left 03/27/2014   Procedure: left knee arthorscopy with medial chondraplasty of the medial femoral and patella, medial microfracture technique of medial femoral condyl;  Surgeon: RTobi Bastos MD;  [facility];  Service: Orthopedics;  Laterality: Left;  . LEFT HEART CATHETERIZATION WITH CORONARY ANGIOGRAM Right 04/27/2013   Procedure: LEFT HEART CATHETERIZATION WITH CORONARY ANGIOGRAM;  Surgeon: DLeonie Man MD;  [facility];  Service: Cardiovascular;  Laterality: Right;  . LOWER EXTREMITY ANGIOGRAM Right 01/31/2016   Procedure: INTRAOP RIGHT LOWER EXTREMITY ANGIOGRAM;  Surgeon: JMal Misty MD;  [place] [facility]  Service: Vascular;  Laterality: Right;  . ORIF WRIST FRACTURE Right 1983  . OVARIAN CYST REMOVAL Right   . PATCH ANGIOPLASTY Right 01/31/2016   Procedure: RIGHT COMMON FEMORAL AND PROFUNDA FEMORIS ENDARECTOMY WITH PATCH ANGIOPLASTY;  Surgeon: JMal Misty MD;  [place]  Service: Vascular;  Laterality: Right;  . PATCH ANGIOPLASTY Right 01/09/2017   Procedure: PATCH ANGIOPLASTY RIGHT POPLITEAL ARTERY BYPASS GRAFT;  Surgeon: CElam Dutch MD;  [place] [facility]  Service: Vascular;  Laterality: Right;  . PERIPHERAL VASCULAR CATHETERIZATION N/A 06/27/2015   Procedure: Lower Extremity Angiography;  Surgeon: JLorretta Harp MD;  [facility];  Service: Cardiovascular;  Laterality: N/A;  . PERIPHERAL VASCULAR CATHETERIZATION Bilateral 01/29/2016   Procedure: Lower Extremity Angiography;  Surgeon: MWellington Hampshire MD;  [facility];  Service: Cardiovascular;  Laterality: Bilateral;  . PERIPHERAL VASCULAR CATHETERIZATION N/A 01/29/2016   Procedure: Abdominal Aortogram;  Surgeon: MWellington Hampshire MD;  [facility];  Service: Cardiovascular;  Laterality: N/A;  . REFRACTIVE SURGERY Bilateral    "6 in one eye; 7 in the other; to relieve pressure; not glaucoma" (01/28/2016)  . SFA Right 06/27/2015   overlapping Bahn covered stents  . TUBAL LIGATION  1983  . VEIN HARVEST Left 05/17/2017   Procedure: LEFT LEG GREATER SAPHENOUS VEIN HARVEST;  Surgeon: ERosetta Posner MD;  [place] [facility]  Service: Vascular;  Laterality: Left;  .Marland KitchenVEIN REPAIR Right 01/31/2016   Procedure: RIGHT GREATER SAPHENOUS VEIN EXAMNED BUT NOT REMOVED;  Surgeon: JMal Misty MD;  [place] [facility]  Service: Vascular;  Laterality: Right;    There were no vitals filed for this  visit.  Subjective Assessment - 03/02/18 0935    Subjective  pt reports she isdoing well. patients family member has been admitted to hospital and is doing well. pt reports no falls since last PT session. pt reports she has an app. with [employer] tomorrow to address pains she has been having with fitting  issues and occasional distal limb R limb pain     Pertinent History  right TFA, arthritis, CAD,A-Fib, PVD, CVA, Glaucoma, migraines, HTN, NSTEMI,  knee pain/left torn meniscus,     Limitations  Lifting;Standing;Walking    Patient Stated Goals  To use prosthesis walking. Previously 2-4 miles.     Currently in Pain?  No/denies         [facility] PT Assessment - 03/02/18 0930      Berg Balance Test   Sit to Stand  Able to stand  independently using hands    Standing Unsupported  Able to stand safely 2 minutes    Sitting with Back Unsupported but Feet Supported on  Floor or Stool  Able to sit safely and securely 2 minutes    Stand to Sit  Controls descent by using hands    Transfers  Able to transfer safely, definite need of hands    Standing Unsupported with Eyes Closed  Able to stand 10 seconds safely    Standing Ubsupported with Feet Together  Able to place feet together independently and stand 1 minute safely    From Standing, Reach Forward with Outstretched Arm  Can reach confidently >25 cm (10")    From Standing Position, Pick up Object from Floor  Able to pick up shoe, needs supervision    From Standing Position, Turn to Look Behind Over each Shoulder  Looks behind one side only/other side shows less weight shift    Turn 360 Degrees  Needs assistance while turning    Standing Unsupported, Alternately Place Feet on Step/Stool  Able to complete >2 steps/needs minimal assist    Standing Unsupported, One Foot in Front  Able to take small step independently and hold 30 seconds    Standing on One Leg  Tries to lift leg/unable to hold 3 seconds but remains standing independently    Total Score  39    Berg comment:  14/56 at eval.     Prosthetics Assessment - 03/02/18 0930      Prosthetics   Prosthetic Care Independent with  Skin check;Residual limb care;Prosthetic cleaning;Care of non-amputated limb;Ply sock cleaning;Correct ply sock adjustment;Proper wear schedule/adjustment;Proper weight-bearing schedule/adjustment    Donning prosthesis   Modified independent (Device/Increase time)    Doffing prosthesis   Modified independent (Device/Increase time)                 [facility] Adult PT Treatment/Exercise - 03/02/18 0930      Transfers   Transfers  Sit to Stand;Stand to Sit    Sit to Stand  6: Modified independent (Device/Increase time)    Sit to Stand Details (indicate cue type and reason)  --    Stand to Sit  6: Modified independent (Device/Increase time)    Stand to Sit Details  --      Ambulation/Gait   Ambulation/Gait  Yes     Ambulation/Gait Assistance  6: Modified independent (Device/Increase time)    Ambulation/Gait Assistance Details  --    Ambulation Distance (Feet)  220 Feet with cane    Assistive device  Straight cane quad tip    Gait Pattern  Narrow base of support;Decreased  stride length;Decreased weight shift to right;Decreased stance time - right;Antalgic;Step-to pattern    Ambulation Surface  Level;Indoor    Ramp  4: Min assist    Ramp Details (indicate cue type and reason)  with cane and verbal cues for pattern    Curb  4: Min assist    Curb Details (indicate cue type and reason)  with cane and verbal cues for step through      Prosthetics   Prosthetic Care Comments   PT instructed in follow-up with prosthetist, socket revision ~6 months into prosthesis and Amputee Support Group      Current prosthetic wear tolerance (days/week)   daily    Current prosthetic wear tolerance (#hours/day)   90% of awake hours( 13.5 of 15 awake hours)             PT Education - 03/02/18 1028    Education provided  Yes    Education Details  D/C from PT services. socket revision and correct sock ply.     Person(s) Educated  Patient    Methods  Explanation    Comprehension  Verbalized understanding       PT Short Term Goals - 01/31/18 1045      PT SHORT TERM GOAL #1   Title  Patient verbalizes & demonstrates proper donning with correct rotation.  (All STGs Target Date: 02/03/2018)    Baseline  MET 01/31/2018    Time  4    Period  Weeks    Status  Achieved      PT SHORT TERM GOAL #2   Title  Patient able to reach 5" & to floor with cane support with supervision.     Baseline  MET 01/31/2018    Time  4    Period  Weeks    Status  Achieved    Target Date  02/03/18      PT SHORT TERM GOAL #3   Title  Patient demonstrates standing ADLs like cooking in kitchen or laundry with prosthesis with supervision.     Baseline  MET 01/31/2018    Time  4    Period  Weeks    Status  Achieved    Target Date  02/03/18       PT SHORT TERM GOAL #4   Title  Patient ambulates 200' with cane & prosthesis with supervision.     Baseline  MET 01/31/2018-- required extra time, slow gait speed with SBSC     Time  4    Period  Weeks    Status  Achieved    Target Date  02/03/18      PT SHORT TERM GOAL #5   Title  Patient negotiates stairs (1 rails), ramps & curbs with cane & prosthesis with minimal assist    Baseline  MET 01/31/2018 MinA with SBSC on curb/ ramps    Time  4    Period  Weeks    Status  Achieved    Target Date  02/03/18        PT Long Term Goals - 03/02/18 1023      PT LONG TERM GOAL #1   Title  Patient verbalizes & demonstrates proper prosthetic care to enable safe use of prosthesis. (All LTGs Target Date: 03/04/2018)    Baseline  MET(3/11)     Status  Achieved      PT LONG TERM GOAL #2   Title  Patient tolerates wear of prosthesis >90% of awake hours without skin issues or  limb pain to enable function during her day.     Baseline  MET (3/11) 13.5 hours of 15 awake hours (90%)( weekend and weekdays))    Status  Achieved      PT LONG TERM GOAL #3   Title  Patient ambulates household around furniture carrying items with cane or less & prosthesis modified independent.     Baseline  MET(3/13) pt demonstrates safety with cane with ambulation short distance to simulate home setting    Status  Achieved      PT LONG TERM GOAL #4   Title  Berg Balance >/= 45/56 to indicate lower fall risk.     Baseline  P.MET(3/13) 39/56 without minimal use of crutch for SLS and stepping. pt has progressed form 14/56 at evaluation     Status  Partially Met      PT LONG TERM GOAL #5   Title  Patient ambulates >1000' with LRAD & prosthesis outdoors including grass, ramps & curbs modified independent.     Baseline  MET(3/11) 1001 feet with crutches MOD I    Status  Achieved      PT LONG TERM GOAL #6   Title  Patient performs standing ADLs including cooking, laundry, sweeping, etc with prosthesis & LRAD modified  indepedent.     Baseline  MET(3/13) pateint has demonstrated ADL skills thought past PT session and reports having no issues with ADLs with use of Cane or single crutch Mod I     Status  Achieved            Plan - 03/02/18 1029    Clinical Impression Statement  During today's PT session pt and PT discus, and agree on,  D/C form current PT plan of care based on pt progression  and current level of function. Today PT performed the Berg balance assessment with a score of 39/56 demonstrating a drastic improvement from initial Evaluation (14/56, and 36/56 modified with RW). Pt met or partially met all her Long term PT goals. Pt continues to demonstrate slow but safe 4 point gait pattern with crutches for community ambulation. Pt also demonstrated 250 ft with cane and would be safe to commute short distances in her community with supervision as well as safe short distance ambulation with cane or single crutch in the home. PT will continue to benefit from continuation of skills she has developed during PT sessions of further improve her functional mobility and reduce falls risk.     Rehab Potential  Good    Clinical Impairments Affecting Rehab Potential  right TFA, arthritis, CAD,A-Fib, PVD, CVA, Glaucoma, migraines, HTN, NSTEMI,  knee pain/left torn meniscus,     PT Next Visit Plan  PT D/C    Consulted and Agree with Plan of Care  Patient       Patient will benefit from skilled therapeutic intervention in order to improve the following deficits and impairments:     Visit Diagnosis: Other abnormalities of gait and mobility  Unsteadiness on feet  Muscle weakness (generalized)  Repeated falls  Amputee, above knee, right [facility])     Problem List Patient Active Problem List   Diagnosis Date Noted  . Phantom limb pain ([place])   . Hypertensive crisis   . Induration of skin   . Abnormality of gait   . Hypoalbuminemia due to protein-calorie malnutrition ([place])   . Amputee, above knee, right  ([place]) 05/25/2017  . Unilateral AKA, right ([place])   . Elevated troponin   . PVD (  peripheral vascular disease) ([place])   . Benign essential HTN   . Acute blood loss anemia   . Ischemia of right lower extremity 05/17/2017  . Thrombosis of right femoral artery ([place]) 01/09/2017  . Ischemia of extremity 01/09/2017  . History of colonic polyps 07/30/2016  . Essential hypertension 01/08/2016  . Polycythemia vera ([place]) 08/20/2015  . Leukocytosis 07/15/2015  . S/P arterial stent right SFA overlapping Bahn covered stents 06/27/2015 06/28/2015  . Claudication ([place]) 06/27/2015  . Carotid artery disease ([place]) 08/24/2014  . Osteoarthritis of left knee 03/27/2014  . External hemorrhoids 06/06/2013  . PAF (paroxysmal atrial fibrillation), maintaing SR 05/11/2013  . Chronic anticoagulation, new- eliquis 05/11/2013  . CAD (coronary artery disease), per cath 04/27/13, non obstructive disease 20-30% LM; LAD 30%; 50-60% OM2; RCA 40%; EF 65-70% 05/11/2013  . Mitral regurgitation,2+ by cath 05/11/2013  . Atrial fibrillation with RVR, hx of PAF previously 04/27/2013  . PAD (peripheral artery disease), Lt carotid 50-70%: , Hx stent Lt exteral iliac & RSFA stent, known occl. LSFA  04/27/2013  . Hyperlipidemia 04/27/2013  . History of CVA (cerebrovascular accident) 04/27/2013  . NSTEMI (non-ST elevated myocardial infarction) ([place]) 04/27/2013     PHYSICAL THERAPY DISCHARGE SUMMARY  Visits from Start of Care: 27  Current functional level related to goals / functional outcomes: See above    Remaining deficits: See above   Education / Equipment: Kasandra Knudsen and single crutch use in home and limited short community with Supervision. Use crutches & prosthesis for full community mobility.  Prosthetic care & follow-up.  Plan: Patient agrees to discharge.  Patient goals were partially met. Patient is being discharged due to being pleased with the current functional level.  ?????          Waunita Schooner  SPT 03/02/2018, 10:37 AM  Jamey Reas PT, DPT 03/02/2018, 3:21 PM  [facility] [address] [place], [place], [address] Phone: [phone]   Fax:  [phone]  Name: MADDELYN ROCCA MRN: [id number] Date of Birth: 1951/06/07

## 2018-03-03 ENCOUNTER — Ambulatory Visit (HOSPITAL_COMMUNITY)
Admission: RE | Admit: 2018-03-03 | Discharge: 2018-03-03 | Disposition: A | Discharge status: Home / Self Care | Payer: Medicare Other | Source: Ambulatory Visit | Attending: Oncology | Admitting: Oncology

## 2018-03-03 DIAGNOSIS — D45 Polycythemia vera: Secondary | ICD-10-CM | POA: Insufficient documentation

## 2018-03-03 LAB — POCT HEMOGLOBIN-HEMACUE: HEMOGLOBIN: 13.2 g/dL (ref 12.0–15.0)

## 2018-03-30 ENCOUNTER — Other Ambulatory Visit (HOSPITAL_COMMUNITY): Payer: Self-pay | Admitting: *Deleted

## 2018-03-31 ENCOUNTER — Ambulatory Visit (HOSPITAL_COMMUNITY)
Admission: RE | Admit: 2018-03-31 | Discharge: 2018-03-31 | Disposition: A | Discharge status: Home / Self Care | Payer: Medicare Other | Source: Ambulatory Visit | Attending: Oncology | Admitting: Oncology

## 2018-03-31 DIAGNOSIS — D45 Polycythemia vera: Secondary | ICD-10-CM

## 2018-03-31 LAB — CBC WITH DIFFERENTIAL/PLATELET
BASOS ABS: 0.3 10*3/uL — AB (ref 0.0–0.1)
Basophils Relative: 1 %
Eosinophils Absolute: 0.5 10*3/uL (ref 0.0–0.7)
Eosinophils Relative: 2 %
HCT: 36.9 % (ref 36.0–46.0)
HEMOGLOBIN: 10.2 g/dL — AB (ref 12.0–15.0)
LYMPHS ABS: 1.5 10*3/uL (ref 0.7–4.0)
LYMPHS PCT: 6 %
MCH: 18.3 pg — ABNORMAL LOW (ref 26.0–34.0)
MCHC: 27.6 g/dL — AB (ref 30.0–36.0)
MCV: 66.2 fL — ABNORMAL LOW (ref 78.0–100.0)
MONOS PCT: 2 %
Monocytes Absolute: 0.5 10*3/uL (ref 0.1–1.0)
Neutro Abs: 22.5 10*3/uL — ABNORMAL HIGH (ref 1.7–7.7)
Neutrophils Relative %: 89 %
PLATELETS: 426 10*3/uL — AB (ref 150–400)
RBC: 5.57 MIL/uL — AB (ref 3.87–5.11)
RDW: 20.7 % — ABNORMAL HIGH (ref 11.5–15.5)
WBC: 25.3 10*3/uL — AB (ref 4.0–10.5)

## 2018-03-31 LAB — POCT HEMOGLOBIN-HEMACUE: HEMOGLOBIN: 10.9 g/dL — AB (ref 12.0–15.0)

## 2018-03-31 NOTE — Progress Notes (Signed)
Hemocue today was 10.9. Pt reports feeling well. Dr. Beryle Beams called and informed. Hold phlebotomy for today. Check CBC and have pt return in a month. Pt verbalized understanding.

## 2018-04-07 ENCOUNTER — Encounter: Payer: Self-pay | Admitting: Physical Medicine & Rehabilitation

## 2018-04-07 ENCOUNTER — Encounter: Payer: Medicare Other | Attending: Physical Medicine & Rehabilitation | Admitting: Physical Medicine & Rehabilitation

## 2018-04-07 ENCOUNTER — Other Ambulatory Visit: Payer: Self-pay

## 2018-04-07 VITALS — BP 138/62 | HR 68 | Ht 65.5 in | Wt 130.4 lb

## 2018-04-07 DIAGNOSIS — I739 Peripheral vascular disease, unspecified: Secondary | ICD-10-CM | POA: Insufficient documentation

## 2018-04-07 DIAGNOSIS — H409 Unspecified glaucoma: Secondary | ICD-10-CM | POA: Diagnosis not present

## 2018-04-07 DIAGNOSIS — E785 Hyperlipidemia, unspecified: Secondary | ICD-10-CM | POA: Diagnosis not present

## 2018-04-07 DIAGNOSIS — I10 Essential (primary) hypertension: Secondary | ICD-10-CM | POA: Diagnosis not present

## 2018-04-07 DIAGNOSIS — Z89611 Acquired absence of right leg above knee: Secondary | ICD-10-CM

## 2018-04-07 DIAGNOSIS — G894 Chronic pain syndrome: Secondary | ICD-10-CM | POA: Diagnosis not present

## 2018-04-07 DIAGNOSIS — I2510 Atherosclerotic heart disease of native coronary artery without angina pectoris: Secondary | ICD-10-CM | POA: Diagnosis not present

## 2018-04-07 DIAGNOSIS — Z87891 Personal history of nicotine dependence: Secondary | ICD-10-CM | POA: Diagnosis not present

## 2018-04-07 DIAGNOSIS — I252 Old myocardial infarction: Secondary | ICD-10-CM | POA: Diagnosis not present

## 2018-04-07 DIAGNOSIS — R269 Unspecified abnormalities of gait and mobility: Secondary | ICD-10-CM | POA: Insufficient documentation

## 2018-04-07 DIAGNOSIS — Z9181 History of falling: Secondary | ICD-10-CM | POA: Diagnosis not present

## 2018-04-07 DIAGNOSIS — G546 Phantom limb syndrome with pain: Secondary | ICD-10-CM | POA: Diagnosis not present

## 2018-04-07 DIAGNOSIS — Z8673 Personal history of transient ischemic attack (TIA), and cerebral infarction without residual deficits: Secondary | ICD-10-CM | POA: Diagnosis not present

## 2018-04-07 DIAGNOSIS — M1711 Unilateral primary osteoarthritis, right knee: Secondary | ICD-10-CM

## 2018-04-07 DIAGNOSIS — K219 Gastro-esophageal reflux disease without esophagitis: Secondary | ICD-10-CM | POA: Diagnosis not present

## 2018-04-07 DIAGNOSIS — D45 Polycythemia vera: Secondary | ICD-10-CM | POA: Insufficient documentation

## 2018-04-07 DIAGNOSIS — I480 Paroxysmal atrial fibrillation: Secondary | ICD-10-CM | POA: Diagnosis not present

## 2018-04-07 NOTE — Progress Notes (Signed)
Subjective:    Patient ID: [name], female    DOB: 1951-02-26, 67 y.o.   MRN: [id number]  HPI   67 year old right-handed female, history of polycythemia vera, diagnosed July 2016, CAD, hypertension, atrial fibrillation presents for follow up for right AKA.  Last clinic visit 02/03/18.  Since that time, pt states she had a reaction to the Gabapentin with SI and HI.  She is doing better after stopping the medication.  She did not bring her prosthesis.  She had it adjusted however, and states it is fitting better. She completed therapies. Her phantom limb pain has improved. BP is controlled. She states she had a fall stepping off a curb.  She uses wheelchair at home and walker in community.  Her left knee has improved.    Pain Inventory Average Pain 2 Pain Right Now 0 My pain is stabbing  In the last 24 hours, has pain interfered with the following? General activity 0 Relation with others 0 Enjoyment of life 0 What TIME of day is your pain at its worst? Evening and Night, night Sleep (in general) Good  Pain is worse with: walking Pain improves with: rest and medication Relief from Meds: 10  Mobility walk with assistance use a walker how many minutes can you walk? 15-30 ability to climb steps?  yes do you drive?  no use a wheelchair transfers alone Do you have any goals in this area?  no  Function disabled: date disabled 2006 Do you have any goals in this area?  no  Neuro/Psych depression  Prior Studies Any changes since last visit?  no  Physicians involved in your care Any changes since last visit?  no   Family History  Problem Relation Age of Onset  . CAD Mother   . Lung cancer Mother   . Heart attack Mother   . CAD Father   . Heart disease Father   . Heart attack Father 76       died in hes 73's with heart disease  . Healthy Sister   . Liver cancer Brother   . Healthy Sister   . CAD Brother        has had CABG  . CAD Brother    Social History    Socioeconomic History  . Marital status: Widowed    Spouse name: Not on file  . Number of children: 2  . Years of education: 19  . Highest education level: Not on file  Occupational History  . Occupation: CNA  Social Needs  . Financial resource strain: Not on file  . Food insecurity:    Worry: Not on file    Inability: Not on file  . Transportation needs:    Medical: Not on file    Non-medical: Not on file  Tobacco Use  . Smoking status: Former Smoker    Packs/day: 1.00    Years: 45.00    Pack years: 45.00    Types: Cigarettes    Last attempt to quit: 11/11/2012    Years since quitting: 5.4  . Smokeless tobacco: Never Used  . Tobacco comment: quit 4 years ago  Substance and Sexual Activity  . Alcohol use: No    Alcohol/week: 0.0 oz  . Drug use: No  . Sexual activity: Not Currently    Birth control/protection: None  Lifestyle  . Physical activity:    Days per week: Not on file    Minutes per session: Not on file  . Stress: Not on  file  Relationships  . Social connections:    Talks on phone: Not on file    Gets together: Not on file    Attends religious service: Not on file    Active member of club or organization: Not on file    Attends meetings of clubs or organizations: Not on file    Relationship status: Not on file  Other Topics Concern  . Not on file  Social History Narrative  . Not on file   Past Surgical History:  Procedure Laterality Date  . AMPUTATION Right 05/21/2017   Procedure: AMPUTATION ABOVE KNEE;  Surgeon: Rosetta Posner, MD;  Location: [facility];  Service: Vascular;  Laterality: Right;  . CARDIAC CATHETERIZATION  04/27/13   non occlusive disease with mild to mod. calcified lesions in ostial LM and proximal LAD and moderate prox. RC AND DISTAL AV GROOVE LCX, ef  . CATARACT EXTRACTION W/PHACO Right 03/21/2015   Procedure: CATARACT EXTRACTION PHACO AND INTRAOCULAR LENS PLACEMENT RIGHT EYE;  Surgeon: Baruch Goldmann, MD;  Location: [facility];  Service:  Ophthalmology;  Laterality: Right;  CDE:5.10  . CATARACT EXTRACTION W/PHACO Left 05/16/2015   Procedure: CATARACT EXTRACTION PHACO AND INTRAOCULAR LENS PLACEMENT (IOC);  Surgeon: Baruch Goldmann, MD;  Location: [facility];  Service: Ophthalmology;  Laterality: Left;  CDE 5.57  . [place]  . COLONOSCOPY N/A 09/11/2016   Procedure: COLONOSCOPY;  Surgeon: Rogene Houston, MD;  [facility];  Service: Endoscopy;  Laterality: N/A;  730-moved to 1:00 Ann notified pt  . EMBOLECTOMY Right 02/01/2016   Procedure: Thrombectomy of Right Femoral-Popliteal Bypass Graft; Endarterectomy of Right Below Knee Popliteal Artery and Tibial Peroneal Trunk with Bovine Pericardium Patch Angioplasty.;  Surgeon: Angelia Mould, MD;  Location: [facility];  Service: Vascular;  Laterality: Right;  . FEMORAL-POPLITEAL BYPASS GRAFT Right 01/31/2016   Procedure: BYPASS GRAFT RIGHT COMMON  FEMORALTO BELOW KNEE POPLITEAL ARTERY BYPASS GRAFT USING 6MM PROPATEN GORTEX GRAFT;  Surgeon: Mal Misty, MD;  Location: [facility];  Service: Vascular;  Laterality: Right;  . FEMORAL-POPLITEAL BYPASS GRAFT Right 01/09/2017   Procedure: THROMBECTOMY OF RIGHT FEMORAL-POPLITEAL  ARTERY BYPASS GRAFT; THROMBECTOMY RIGHT  TIBIAL VESSELS;  Surgeon: [name], MD;  Location: [facility];  Service: Vascular;  Laterality: Right;  . FEMORAL-POPLITEAL BYPASS GRAFT Right 05/17/2017   Procedure: RIGHT FEMORAL-TIBIAL PERONEAL TRUNK ARTERY BYPASS GRAFT USING 45mmX80cm PROPATEN GRAFT WITH REMOVABLE RING;  Surgeon: Rosetta Posner, MD;  Location: [facility];  Service: Vascular;  Laterality: Right;  . FRACTURE SURGERY    . ILIAC ARTERY STENT  07/2006   external iliac and Rt SFA stent 07/2006 AND THE LEFT WAS IN 2006  . KNEE ARTHROSCOPY WITH MEDIAL MENISECTOMY Left 03/27/2014   Procedure: left knee arthorscopy with medial chondraplasty of the medial femoral and patella, medial microfracture technique of medial femoral condyl;  Surgeon: Tobi Bastos, MD;   Location: [facility];  Service: Orthopedics;  Laterality: Left;  . LEFT HEART CATHETERIZATION WITH CORONARY ANGIOGRAM Right 04/27/2013   Procedure: LEFT HEART CATHETERIZATION WITH CORONARY ANGIOGRAM;  Surgeon: Leonie Man, MD;  Location: [facility] C[facility];  Service: Cardiovascular;  Laterality: Right;  . LOWER EXTREMITY ANGIOGRAM Right 01/31/2016   Procedure: INTRAOP RIGHT LOWER EXTREMITY ANGIOGRAM;  Surgeon: Mal Misty, MD;  Location: [place];  Service: Vascular;  Laterality: Right;  . ORIF WRIST FRACTURE Right 1983  . OVARIAN CYST REMOVAL Right   . PATCH ANGIOPLASTY Right 01/31/2016   Procedure: RIGHT COMMON  FEMORAL AND PROFUNDA FEMORIS ENDARECTOMY WITH PATCH ANGIOPLASTY;  Surgeon: Mal Misty, MD;  Location: [place];  Service: Vascular;  Laterality: Right;  . PATCH ANGIOPLASTY Right 01/09/2017   Procedure: PATCH ANGIOPLASTY RIGHT POPLITEAL ARTERY BYPASS GRAFT;  Surgeon: [name], MD;  Location: [place];  Service: Vascular;  Laterality: Right;  . PERIPHERAL VASCULAR CATHETERIZATION N/A 06/27/2015   Procedure: Lower Extremity Angiography;  Surgeon: Lorretta Harp, MD;  Location: [place] CV LAB;  Service: Cardiovascular;  Laterality: N/A;  . PERIPHERAL VASCULAR CATHETERIZATION Bilateral 01/29/2016   Procedure: Lower Extremity Angiography;  Surgeon: Wellington Hampshire, MD;  Location: [facility] [facility];  Service: Cardiovascular;  Laterality: Bilateral;  . PERIPHERAL VASCULAR CATHETERIZATION N/A 01/29/2016   Procedure: Abdominal Aortogram;  Surgeon: Wellington Hampshire, MD;  Location: [place] [facility];  Service: Cardiovascular;  Laterality: N/A;  . REFRACTIVE SURGERY Bilateral    "6 in one eye; 7 in the other; to relieve pressure; not glaucoma" (01/28/2016)  . SFA Right 06/27/2015   overlapping Bahn covered stents  . TUBAL LIGATION  1983  . VEIN HARVEST Left 05/17/2017   Procedure: LEFT LEG GREATER SAPHENOUS VEIN HARVEST;  Surgeon: Rosetta Posner, MD;  Location: [place];  Service: Vascular;  Laterality:  Left;  Marland Kitchen VEIN REPAIR Right 01/31/2016   Procedure: RIGHT GREATER SAPHENOUS VEIN EXAMNED BUT NOT REMOVED;  Surgeon: Mal Misty, MD;  Location: [place];  Service: Vascular;  Laterality: Right;   Past Medical History:  Diagnosis Date  . Arthritis    "fingers, some in my knees" (01/28/2016)  . CAD (coronary artery disease), per cath 04/27/13, non obstructive disease, EF 65-70% 05/11/2013  . Carotid disease, bilateral ([place]) 09/2013   Lt carotid 50-69% stentosis, less on rt  . Dysrhythmia   . Fatty liver   . GERD (gastroesophageal reflux disease)   . Glaucoma   . H/O cardiovascular stress test 12/27/2009   negative for ischemia  . Headache    "occasional since eye pressure regulated" (01/28/2016)  . Hyperlipidemia   . Hypertension   . Leukocytosis 07/15/2015  . Migraine    "stopped w/laser holes to relieve pressure in my eyes; had them 3-4 times/wk before taht" (01/28/2016)  . Mitral regurgitation,2+ by cath 05/11/2013  . NSTEMI (non-ST elevated myocardial infarction) ([place]) 04/27/2013  . Pain    BOTH KNEES - PT HAS TORN MENISCUS LEFT KNEE  . Paroxysmal atrial fibrillation (HCC)    hx of a fib on ASA  AND ELIQUIS   . Polycythemia vera ([place]) 08/20/2015   JAK-2 positive 07/19/15  . PVD (peripheral vascular disease) with claudication ([place]) 07/2006   previous L ext iliac stent and rt SFA stent, known occluded Lt SFA  . S/P cardiac cath 04/27/13   NON OBSTRUCTIVE DISEASE, 2+ mr, ef 65-70%  . Statin intolerance   . Stroke ([place]) 2006   SWELLING ALL OVER AND RT SIDE OF FACE DRAWN AND SPEECH SLURRED AND NUMBNESS ON RIGHT SIDE-- ALL RESOLVED   BP 138/62   Pulse 68   Ht 5' 5.5" (1.664 m) Comment: pt reported  Wt 130 lb 6.4 oz (59.1 kg) Comment: pt reported in wheelchair  SpO2 97%   BMI 21.37 kg/m   Opioid Risk Score:   Fall Risk Score:  `1  Depression screen PHQ 2/9  Depression screen [facility] 2/9 04/07/2018 01/11/2018 11/03/2017 07/06/2017 03/09/2017 10/13/2016 04/07/2016  Decreased Interest 2 0 0 0 0 0 0    Down, Depressed, Hopeless 2 0 0  0 0 0 0  PHQ - 2 Score 4 0 0 0 0 0 0  Altered sleeping 2 - - - - - -  Tired, decreased energy 2 - - - - - -  Change in appetite 2 - - - - - -  Feeling bad or failure about yourself  2 - - - - - -  Trouble concentrating 2 - - - - - -  Moving slowly or fidgety/restless 2 - - - - - -  Suicidal thoughts 1 - - - - - -  PHQ-9 Score 17 - - - - - -  Difficult doing work/chores Very difficult - - - - - -    Review of Systems  Constitutional: Negative.   HENT: Negative.   Eyes: Negative.   Respiratory: Negative.   Gastrointestinal: Negative.   Endocrine: Negative.   Genitourinary: Negative.   Musculoskeletal: Positive for gait problem.  Skin: Negative.   Allergic/Immunologic: Negative.   Hematological: Negative.   Psychiatric/Behavioral: Positive for dysphoric mood.  All other systems reviewed and are negative.     Objective:   Physical Exam  Constitutional: She appears well-developed. NAD. HENT: Normocephalic. Atraumatic Eyes: EOM are normal. No discharge.  Cardiovascular: RRR. No JVD. Respiratory: Effort normal and breath sounds normal.  GI: Soft. Bowel sounds are normal.  Musculoskeletal:  Gait: RLE externally rotated with poor pend at the knee with crutches Right AKA Neurological: She is alert and oriented.  Motor: B/L UE 5/5 proximal to distal LLE: 5/5 proximal to distal RLE: HF 5/5 HF  Skin: Incision healed.  Psychiatric: She has a normal mood and affect. Her behavior is normal.     Assessment & Plan:  67 year old right-handed female, history of polycythemia vera, diagnosed July 2016, CAD, hypertension, atrial fibrillation presents for follow up for right AKA.  1. Right AKA 05/21/2017 as well as PVD with multiple revascularization procedures   Cont HEP, completed therapies  Localized discomfort, encouraged follow up with prosthetist if no improvement, pt had recent adjustment with benefit..   2. Phantom limb Pain: Improved  SI and  HI with Gabapentin   3. Gait abnormality with falls  Cont walker/wheelchair for safety  4. Left knee OA  Cont Voltaren gel

## 2018-04-28 ENCOUNTER — Ambulatory Visit (HOSPITAL_COMMUNITY)
Admission: RE | Admit: 2018-04-28 | Discharge: 2018-04-28 | Disposition: A | Discharge status: Home / Self Care | Payer: Medicare Other | Source: Ambulatory Visit | Attending: Oncology | Admitting: Oncology

## 2018-04-28 ENCOUNTER — Other Ambulatory Visit: Payer: Self-pay | Admitting: Oncology

## 2018-04-28 DIAGNOSIS — D45 Polycythemia vera: Secondary | ICD-10-CM | POA: Insufficient documentation

## 2018-04-28 NOTE — Progress Notes (Signed)
pts hemocue today is 10.  Parameters for holding phlebotomy is for less than 12.  Spoke with Dr Beryle Beams and he stated his office would call her about scheduling monthly CBC draws and pt verbalized understanding.

## 2018-04-29 LAB — POCT HEMOGLOBIN-HEMACUE: Hemoglobin: 10 g/dL — ABNORMAL LOW (ref 12.0–15.0)

## 2018-05-10 ENCOUNTER — Other Ambulatory Visit: Payer: Self-pay

## 2018-05-10 ENCOUNTER — Encounter: Payer: Self-pay | Admitting: Oncology

## 2018-05-10 ENCOUNTER — Ambulatory Visit (INDEPENDENT_AMBULATORY_CARE_PROVIDER_SITE_OTHER): Payer: Medicare Other | Admitting: Oncology

## 2018-05-10 ENCOUNTER — Encounter: Payer: Medicare Other | Admitting: Oncology

## 2018-05-10 VITALS — BP 158/55 | HR 70 | Temp 98.0°F | Ht 65.5 in | Wt 131.9 lb

## 2018-05-10 DIAGNOSIS — Z9713 Presence of artificial right leg (complete) (partial): Secondary | ICD-10-CM

## 2018-05-10 DIAGNOSIS — Z7901 Long term (current) use of anticoagulants: Secondary | ICD-10-CM | POA: Diagnosis not present

## 2018-05-10 DIAGNOSIS — Z951 Presence of aortocoronary bypass graft: Secondary | ICD-10-CM

## 2018-05-10 DIAGNOSIS — Z8673 Personal history of transient ischemic attack (TIA), and cerebral infarction without residual deficits: Secondary | ICD-10-CM

## 2018-05-10 DIAGNOSIS — G8929 Other chronic pain: Secondary | ICD-10-CM | POA: Diagnosis not present

## 2018-05-10 DIAGNOSIS — Z888 Allergy status to other drugs, medicaments and biological substances status: Secondary | ICD-10-CM

## 2018-05-10 DIAGNOSIS — Z955 Presence of coronary angioplasty implant and graft: Secondary | ICD-10-CM

## 2018-05-10 DIAGNOSIS — R10811 Right upper quadrant abdominal tenderness: Secondary | ICD-10-CM

## 2018-05-10 DIAGNOSIS — I252 Old myocardial infarction: Secondary | ICD-10-CM | POA: Diagnosis not present

## 2018-05-10 DIAGNOSIS — Z86718 Personal history of other venous thrombosis and embolism: Secondary | ICD-10-CM

## 2018-05-10 DIAGNOSIS — Z89611 Acquired absence of right leg above knee: Secondary | ICD-10-CM

## 2018-05-10 DIAGNOSIS — E785 Hyperlipidemia, unspecified: Secondary | ICD-10-CM | POA: Diagnosis not present

## 2018-05-10 DIAGNOSIS — I6523 Occlusion and stenosis of bilateral carotid arteries: Secondary | ICD-10-CM | POA: Diagnosis not present

## 2018-05-10 DIAGNOSIS — D45 Polycythemia vera: Secondary | ICD-10-CM | POA: Diagnosis not present

## 2018-05-10 DIAGNOSIS — I480 Paroxysmal atrial fibrillation: Secondary | ICD-10-CM

## 2018-05-10 DIAGNOSIS — Z91011 Allergy to milk products: Secondary | ICD-10-CM

## 2018-05-10 DIAGNOSIS — I2510 Atherosclerotic heart disease of native coronary artery without angina pectoris: Secondary | ICD-10-CM | POA: Diagnosis not present

## 2018-05-10 DIAGNOSIS — I739 Peripheral vascular disease, unspecified: Secondary | ICD-10-CM

## 2018-05-10 DIAGNOSIS — H409 Unspecified glaucoma: Secondary | ICD-10-CM

## 2018-05-10 DIAGNOSIS — Z880 Allergy status to penicillin: Secondary | ICD-10-CM

## 2018-05-10 DIAGNOSIS — Z885 Allergy status to narcotic agent status: Secondary | ICD-10-CM

## 2018-05-10 DIAGNOSIS — D509 Iron deficiency anemia, unspecified: Secondary | ICD-10-CM

## 2018-05-10 DIAGNOSIS — Z7982 Long term (current) use of aspirin: Secondary | ICD-10-CM

## 2018-05-10 LAB — CBC WITH DIFFERENTIAL/PLATELET
BASOS PCT: 1 %
Basophils Absolute: 0.2 10*3/uL — ABNORMAL HIGH (ref 0.0–0.1)
EOS PCT: 3 %
Eosinophils Absolute: 0.7 10*3/uL (ref 0.0–0.7)
HEMATOCRIT: 38.4 % (ref 36.0–46.0)
Hemoglobin: 10.3 g/dL — ABNORMAL LOW (ref 12.0–15.0)
Lymphocytes Relative: 6 %
Lymphs Abs: 1.4 10*3/uL (ref 0.7–4.0)
MCH: 17.6 pg — ABNORMAL LOW (ref 26.0–34.0)
MCHC: 26.8 g/dL — ABNORMAL LOW (ref 30.0–36.0)
MCV: 65.5 fL — AB (ref 78.0–100.0)
MONO ABS: 0.5 10*3/uL (ref 0.1–1.0)
Monocytes Relative: 2 %
NEUTROS ABS: 21.1 10*3/uL — AB (ref 1.7–7.7)
Neutrophils Relative %: 88 %
Platelets: 490 10*3/uL — ABNORMAL HIGH (ref 150–400)
RBC: 5.86 MIL/uL — ABNORMAL HIGH (ref 3.87–5.11)
RDW: 22.5 % — AB (ref 11.5–15.5)
WBC: 23.9 10*3/uL — ABNORMAL HIGH (ref 4.0–10.5)

## 2018-05-10 LAB — FERRITIN: Ferritin: 23 ng/mL (ref 11–307)

## 2018-05-10 LAB — SAVE SMEAR

## 2018-05-10 NOTE — Patient Instructions (Addendum)
To lab today Stop phlebotomy for now Start iron sulfate 325 mg daily/ Return for lab in 6 weeks MD visit 1-2 weeks after lab

## 2018-05-11 ENCOUNTER — Other Ambulatory Visit: Payer: Self-pay | Admitting: Oncology

## 2018-05-11 DIAGNOSIS — D45 Polycythemia vera: Secondary | ICD-10-CM

## 2018-05-11 NOTE — Progress Notes (Signed)
Hematology and Oncology Follow Up Visit  [name] [id number] [birth year] 67 y.o. 05/11/2018 11:34 AM   Principle Diagnosis: Encounter Diagnoses  Name Primary?  . Chronic anticoagulation, new- eliquis Yes  . Microcytic anemia   . Polycythemia vera ([facility])   Clinical summary: Pleasant 67 year old woman I initially evaluated on 07/15/2015 for unexplained leukocytosis. I initially thought we were dealing with an inflammatory process related to a foreign body reaction to multiple vascular stents, however in reviewing her laboratory data, I noted that she had a chronic anemia with hemoglobins running 12 g with MCV 90 until 03/20/2014 Hemoglobin began to rise first to 14.1 with subsequent values in 2016 up to 16 g recorded on 06/27/2015. She had normal white blood counts back in December 2010. Next white count recorded was in May 2014 at 13,300. Subsequent counts were all 10,000 or above with a count on 07/15/2015 at 17,000 with 85% neutrophils. 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 Her platelet count has been normal. I did not appreciate any organomegaly on my initial exam but on my subsequent exams I was able to palpate an enlarged spleen. discussed the diagnosis of myeloproliferative disorder with her in detail at time of her 08/20/2015 follow-up visit. . Predominant complication would be predisposition towards thrombosis. She was already on aspirin and Xarelto so she is covered in that regard. With respect to thrombotic risk of increased blood viscosity, current guideline recommends keeping hematocrit less than 45%. This should be accomplished very easily in this woman who is peak hematocrit has not exceeded 52%. We discussed aphlebotomy program.I  initiated a phlebotomy program removing of 500 mL's of blood monthly 3 with plans to reevaluate after that. I anticipated seeing a rapid fall in her hematocrit and then be able to space out subsequent phlebotomies to an every 3-4 month interval. She had a good understanding of all we discussed. She had her first phlebotomy Monday, September 02, 2015. She had 4 monthly phlebotomies through 12/02/2015. In February 2017, she underwent a vascular bypass procedure on her right lower extremity and lost a significant amount of blood with hemoglobin falling down as low as 6.6 on February 14. She was given temporary iron supplementation. Hemoglobin rose up to 11 g.  She had a re-thrombosis of a right femoral popliteal bypass graft and underwent urgent thrombectomy January 09, 2017    Interim History: Overall doing well.  Main complaint is profound fatigue.  She has had a significant drop in her hemoglobin on the phlebotomy program down from 14.8 g in December 2018 to 10 g by March 31, 2018.  I held a scheduled phlebotomy on April 11 and I will continue to hold until hemoglobin drifts back up again. She has chronic, stable, leukocytosis, and mild thrombocytosis all of which are explained by her myeloproliferative disorder.  There is no evidence for leukemic transformation. No cardiorespiratory complaints.  No GI complaints although she is always sensitive in her right upper abdominal quadrant.  No change in bowel habit.  No hematochezia or melena.  She continues to adjust to her right lower extremity prosthesis.  Ambulating with a walker.  Medications: reviewed  Allergies:  Allergies  Allergen Reactions  . Atenolol Rash and Itching  . Demerol  [Meperidine Hcl] Itching  . Demerol [Meperidine] Other (See Comments) and Itching  Unknown, can't remember  Unknown, can't remember   . Gabapentin Anxiety and Other (See Comments)    SI and HI  . Inderal [Propranolol] Other (See Comments) and Itching     Doesn't remember   . Prednisone Other (See Comments)    Muscle spasms  . Statins Itching and Other (See Comments)    Simvastatin-caused severe itching Pravastatin-caused lesser tiching One type of statin? Caused stroke in 2006, and other symptoms as result  . Warfarin And Related Other (See Comments)    Caused nose bleeds  . Crestor [Rosuvastatin] Itching and Rash  . Docosahexaenoic Acid ([place])     Other reaction(s): Other (See Comments) nosebleeds  . Lactose Intolerance (Gi) Other (See Comments)    Bloating, gas  . Lipitor [Atorvastatin] Rash  . Lovaza [Omega-3-Acid Ethyl Esters] Other (See Comments)    nosebleeds   . Penicillins Hives, Rash and Other (See Comments)    Has patient had a PCN reaction causing immediate rash, facial/tongue/throat swelling, SOB or lightheadedness with hypotension: Yes Has patient had a PCN reaction causing severe rash involving mucus membranes or skin necrosis: No Has patient had a PCN reaction that required hospitalization: No Has patient had a PCN reaction occurring within the last 10 years: No If all of the above answers are "NO", then may proceed with Cephalosporin use.  Questionable high fever   . Pravastatin Itching and Rash  . Simvastatin Itching, Rash and Other (See Comments)  . Trilipix [Choline Fenofibrate] Other (See Comments)    Doesn't remember   . Warfarin Other (See Comments)    nosebleeds  . Hydralazine Swelling  . Effexor [Venlafaxine Hcl] Other (See Comments)    Doesn't remember   . Nexium [Esomeprazole Magnesium] Rash  . Percocet [Oxycodone-Acetaminophen] Other (See Comments)    Doesn't remember   . Plavix [Clopidogrel Bisulfate] Rash    Review of Systems: See interim history Remaining ROS negative:   Physical Exam: Blood pressure (!) 158/55, pulse 70, temperature 98 F (36.7 C), temperature source Oral, height 5' 5.5" (1.664 m), weight 131 lb 14.4 oz (59.8 kg), SpO2 99 %. Wt Readings from Last 3 Encounters:  05/10/18  131 lb 14.4 oz (59.8 kg)  04/28/18 132 lb (59.9 kg)  04/07/18 130 lb 6.4 oz (59.1 kg)     General appearance: Petite well-nourished Caucasian woman HENNT: Pharynx no erythema, exudate, mass, or ulcer. No thyromegaly or thyroid nodules Lymph nodes: No cervical, supraclavicular, or axillary lymphadenopathy Breasts: No abnormal skin changes, no dominant mass in either breast Lungs: Clear to auscultation, resonant to percussion throughout Heart: Regular rhythm, no murmur, no gallop, no rub, no click, no edema Abdomen: Soft, mild, chronic, tenderness in the right upper quadrant, normal bowel sounds, no mass, no organomegaly.  I was unable to palpate her spleen today. Extremities: Right above the knee amputation.  Prosthetic limb.  No edema, no calf tenderness of the left lower extremity. Musculoskeletal: no joint deformities GU:  Vascular: Carotid pulses 2+, no bruits,  Neurologic: Alert, oriented, PERRLA,   cranial nerves grossly normal, motor strength 5 over 5, reflexes 1+ symmetric, at the biceps, 1+ at the left knee.  Upper body coordination normal, gait: She is able to ambulate with the assistance of a walker.  Right above the knee amputation with a prosthetic limb. Skin: No rash or ecchymosis  Lab Results: CBC W/Diff    Component Value Date/Time   WBC 23.9 (H) 05/10/2018 1142   RBC 5.86 (H) 05/10/2018 1142   HGB 10.3 (L)  05/10/2018 1142   HGB 14.3 12/31/2017 1119   HCT 38.4 05/10/2018 1142   HCT 46.8 (H) 12/31/2017 1119   PLT 490 (H) 05/10/2018 1142   PLT 407 (H) 12/31/2017 1119   MCV 65.5 (L) 05/10/2018 1142   MCV 70 (L) 12/31/2017 1119   MCH 17.6 (L) 05/10/2018 1142   MCHC 26.8 (L) 05/10/2018 1142   RDW 22.5 (H) 05/10/2018 1142   RDW 19.4 (H) 12/31/2017 1119   LYMPHSABS 1.4 05/10/2018 1142   LYMPHSABS 2.8 12/31/2017 1119   MONOABS 0.5 05/10/2018 1142   EOSABS 0.7 05/10/2018 1142   EOSABS 1.1 (H) 12/31/2017 1119   BASOSABS 0.2 (H) 05/10/2018 1142   BASOSABS 0.6 (H)  12/31/2017 1119     Chemistry      Component Value Date/Time   NA 137 09/22/2017 1835   NA 140 07/06/2017 1113   K 4.7 09/22/2017 1835   CL 100 (L) 09/22/2017 1835   CO2 26 09/22/2017 1835   BUN 17 09/22/2017 1835   BUN 30 (H) 07/06/2017 1113   CREATININE 0.79 09/22/2017 1835   CREATININE 1.33 (H) 03/03/2016 1531      Component Value Date/Time   CALCIUM 9.5 09/22/2017 1835   ALKPHOS 71 07/06/2017 1113   AST 22 07/06/2017 1113   ALT 18 07/06/2017 1113   BILITOT <0.2 07/06/2017 1113       Radiological Studies: No results found.  Impression:  1.  Myeloproliferative disorder. Polycythemia vera.  This is a stem cell disorder and she has a concomitant leukocytosis and thrombocytosis which are stable. Decreasing phlebotomy requirements over time which is typical.  I am going to monitor her blood counts on a monthly basis.  Hold phlebotomies until hemoglobin back to 13 g or above.  I did tell her to start an iron supplement ferrous sulfate 325 mg once daily.  2.  Advanced peripheral vascular disease status post vascular bypass surgery bilaterally status post right AKA.  3.  Coronary artery disease status post MI May 2014  4.  Paroxysmal atrial fibrillation on long-term anticoagulation with Eliquis.  5.  Cerebrovascular disease with prior TIA.  Known bilateral carotid artery stenosis.  6.  Hyperlipidemia  7.  Glaucoma.  CC: Patient Care Team: Dione Housekeeper, MD as PCP - General (Family Medicine) Lorretta Harp, MD as Consulting Physician (Cardiology) Annia Belt, MD as Consulting Physician (Oncology) [name], MD as Consulting Physician (Vascular Surgery)   Murriel Hopper, MD, [place]  Hematology-Oncology/Internal Medicine     5/22/201911:34 AM

## 2018-05-25 ENCOUNTER — Encounter (HOSPITAL_COMMUNITY): Payer: Medicare Other

## 2018-05-26 ENCOUNTER — Encounter (HOSPITAL_COMMUNITY): Payer: Medicare Other

## 2018-06-01 ENCOUNTER — Encounter: Payer: Self-pay | Admitting: Cardiovascular Disease

## 2018-06-01 ENCOUNTER — Ambulatory Visit (INDEPENDENT_AMBULATORY_CARE_PROVIDER_SITE_OTHER): Payer: Medicare Other | Admitting: Cardiovascular Disease

## 2018-06-01 VITALS — BP 152/50 | HR 70 | Ht 65.5 in | Wt 125.0 lb

## 2018-06-01 DIAGNOSIS — I480 Paroxysmal atrial fibrillation: Secondary | ICD-10-CM

## 2018-06-01 DIAGNOSIS — I739 Peripheral vascular disease, unspecified: Secondary | ICD-10-CM | POA: Diagnosis not present

## 2018-06-01 DIAGNOSIS — I6529 Occlusion and stenosis of unspecified carotid artery: Secondary | ICD-10-CM | POA: Diagnosis not present

## 2018-06-01 DIAGNOSIS — E7800 Pure hypercholesterolemia, unspecified: Secondary | ICD-10-CM | POA: Diagnosis not present

## 2018-06-01 DIAGNOSIS — I10 Essential (primary) hypertension: Secondary | ICD-10-CM

## 2018-06-01 NOTE — Progress Notes (Signed)
06/01/2018 [name]   Nov 29, 1951  [id number]  Primary Physician Stacey Housekeeper, MD Primary Cardiologist: Stacey Harp MD [name], [place]  HPI:  [name] is a 67 y.o.  thin-appearing, widowed Caucasian female, mother of 2 children, grandmother to 5 grandchildren and 3 step-grandchildren who she has raised. I last saw her in the office  04/20/2017. She has a history of PVOD status post left external iliac artery PTA and stenting as well as right SFA stenting by myself August 2007 with a known occluded left SFA. Her other problems include recently discontinued tobacco abuse November 11, 2012, hypertension and hyperlipidemia. She is statin intolerant. She has had PAF in the past and has been on Coumadin anticoagulation which was stopped because of nosebleeds and changed to Eliquis . She really denies chest pain, shortness of breath, or claudication. Her last Myoview performed December 27, 2009, was nonischemic. Dopplers performed a year ago showed patent stents with right ABI of 0.87 and a left of 0.65 .She had cardiac catheterization performed by [name] revealed noncritical CAD.since I saw her back in September of last year she developed recurrent right calf claudication beginning in November or December of last year. Most recent Doppler show A decline in her ABI from 0.1-0.5 with an occluded right SFA stent suggesting in-stent restenosis. I performed angiography on her 06/27/15 revealing an occluded right SFA stent which I recanalized and restented using overlapping Viabahn covered stents. Her ABI increased from 0.5 up to 0.8 . Just after I saw her last year she did develop vertical limb ischemia on the right side and underwent urgent angiography by [name] 01/29/16 revealing an occluded right SFA Viabahn stent. She underwent urgent right femoropopliteal bypass grafting by [name] after this. She did have thrombosis of her graft with thrombectomy by [name] 01/09/17 .  She  ultimately underwent right BKA on 05/21/2017 and is currently wearing a prosthesis. Since I saw her a year ago she is remained stable.  She denies chest pain or shortness of breath.    Current Meds  Medication Sig  . acetaminophen (TYLENOL) 500 MG tablet Take 1,000 mg by mouth every 6 (six) hours as needed for moderate pain.   Marland Kitchen apixaban (ELIQUIS) 5 MG TABS tablet Take 1 tablet (5 mg total) by mouth 2 (two) times daily.  . ARTIFICIAL TEAR OP Place 1 drop into both eyes as needed (for dry eyes).  Marland Kitchen aspirin EC 325 MG tablet Take 325 mg by mouth daily.  . cyclobenzaprine (FLEXERIL) 5 MG tablet Take one tablet by mouth 3 (three) times a day as neededfor Muscle spasms.  . diclofenac sodium (VOLTAREN) 1 % GEL 2 g.  . diltiazem (CARDIZEM CD) 180 MG 24 hr capsule Take 1 capsule (180 mg total) by mouth every morning.  . docusate sodium (COLACE) 100 MG capsule Take 200 mg by mouth every evening.   . fenofibrate (TRICOR) 145 MG tablet Take 1 tablet (145 mg total) by mouth at bedtime. PLEASE CONTACT OFFICE FOR ADDITIONAL REFILLS  . flecainide (TAMBOCOR) 50 MG tablet Take 1 tablet (50 mg total) by mouth 2 (two) times daily. PLEASE CONTACT OFFICE FOR ADDITIONAL REFILLS  . hydrALAZINE (APRESOLINE) 25 MG tablet Take 25 mg by mouth 2 (two) times daily.  . hydrALAZINE (APRESOLINE) 25 MG tablet Take 25 mg by mouth.  Marland Kitchen lisinopril (PRINIVIL,ZESTRIL) 20 MG tablet Take 1 tablet (20 mg total) by mouth 2 (two) times daily.  Marland Kitchen lisinopril (PRINIVIL,ZESTRIL)  20 MG tablet Take by mouth.  Marland Kitchen omeprazole (PRILOSEC) 20 MG capsule Take 1 capsule (20 mg total) by mouth 2 (two) times daily before a meal.  . traMADol (ULTRAM) 50 MG tablet TAKE (1) TABLET EVERY FOUR HOURS AS NEEDED FOR PAIN. - MAY MAKE DROWSY -     Allergies  Allergen Reactions  . Atenolol Rash and Itching  . Demerol  [Meperidine Hcl] Itching  . Demerol [Meperidine] Other (See Comments) and Itching    Unknown, can't remember  Unknown, can't remember   .  Gabapentin Anxiety and Other (See Comments)    SI and HI  . Inderal [Propranolol] Other (See Comments) and Itching    Doesn't remember   . Prednisone Other (See Comments)    Muscle spasms  . Statins Itching and Other (See Comments)    Simvastatin-caused severe itching Pravastatin-caused lesser tiching One type of statin? Caused stroke in 2006, and other symptoms as result  . Warfarin And Related Other (See Comments)    Caused nose bleeds  . Crestor [Rosuvastatin] Itching and Rash  . Docosahexaenoic Acid (Dha)     Other reaction(s): Other (See Comments) nosebleeds  . Lactose Intolerance (Gi) Other (See Comments)    Bloating, gas  . Lipitor [Atorvastatin] Rash  . Lovaza [Omega-3-Acid Ethyl Esters] Other (See Comments)    nosebleeds   . Penicillins Hives, Rash and Other (See Comments)    Has patient had a PCN reaction causing immediate rash, facial/tongue/throat swelling, SOB or lightheadedness with hypotension: Yes Has patient had a PCN reaction causing severe rash involving mucus membranes or skin necrosis: No Has patient had a PCN reaction that required hospitalization: No Has patient had a PCN reaction occurring within the last 10 years: No If all of the above answers are "NO", then may proceed with Cephalosporin use.  Questionable high fever   . Pravastatin Itching and Rash  . Simvastatin Itching, Rash and Other (See Comments)  . Trilipix [Choline Fenofibrate] Other (See Comments)    Doesn't remember   . Warfarin Other (See Comments)    nosebleeds  . Hydralazine Swelling  . Effexor [Venlafaxine Hcl] Other (See Comments)    Doesn't remember   . Nexium [Esomeprazole Magnesium] Rash  . Percocet [Oxycodone-Acetaminophen] Other (See Comments)    Doesn't remember   . Plavix [Clopidogrel Bisulfate] Rash    Social History   Socioeconomic History  . Marital status: Widowed    Spouse name: Not on file  . Number of children: 2  . Years of education: 41  . Highest education  level: Not on file  Occupational History  . Occupation: CNA  Social Needs  . Financial resource strain: Not on file  . Food insecurity:    Worry: Not on file    Inability: Not on file  . Transportation needs:    Medical: Not on file    Non-medical: Not on file  Tobacco Use  . Smoking status: Former Smoker    Packs/day: 1.00    Years: 45.00    Pack years: 45.00    Types: Cigarettes    Last attempt to quit: 11/11/2012    Years since quitting: 5.5  . Smokeless tobacco: Never Used  . Tobacco comment: quit 4 years ago  Substance and Sexual Activity  . Alcohol use: No    Alcohol/week: 0.0 oz  . Drug use: No  . Sexual activity: Not Currently    Birth control/protection: None  Lifestyle  . Physical activity:    Days per week:  Not on file    Minutes per session: Not on file  . Stress: Not on file  Relationships  . Social connections:    Talks on phone: Not on file    Gets together: Not on file    Attends religious service: Not on file    Active member of club or organization: Not on file    Attends meetings of clubs or organizations: Not on file    Relationship status: Not on file  . Intimate partner violence:    Fear of current or ex partner: Not on file    Emotionally abused: Not on file    Physically abused: Not on file    Forced sexual activity: Not on file  Other Topics Concern  . Not on file  Social History Narrative  . Not on file     Review of Systems: General: negative for chills, fever, night sweats or weight changes.  Cardiovascular: negative for chest pain, dyspnea on exertion, edema, orthopnea, palpitations, paroxysmal nocturnal dyspnea or shortness of breath Dermatological: negative for rash Respiratory: negative for cough or wheezing Urologic: negative for hematuria Abdominal: negative for nausea, vomiting, diarrhea, bright red blood per rectum, melena, or hematemesis Neurologic: negative for visual changes, syncope, or dizziness All other systems  reviewed and are otherwise negative except as noted above.    Blood pressure (!) 152/50, pulse 70, height 5' 5.5" (1.664 m), weight 125 lb (56.7 kg).  General appearance: alert and no distress Neck: no adenopathy, no JVD, supple, symmetrical, trachea midline, thyroid not enlarged, symmetric, no tenderness/mass/nodules and Bilateral carotid bruits right louder than left Lungs: clear to auscultation bilaterally Heart: regular rate and rhythm, S1, S2 normal, no murmur, click, rub or gallop Extremities: Right BKA wearing prosthesis Pulses: Absent pedal pulses Skin: Skin color, texture, turgor normal. No rashes or lesions Neurologic: Alert and oriented X 3, normal strength and tone. Normal symmetric reflexes. Normal coordination and gait  EKG sinus rhythm at 70 without ST or T wave changes.  I personally reviewed this EKG  ASSESSMENT AND PLAN:   PAD (peripheral artery disease), Lt carotid 50-70%: , Hx stent Lt exteral iliac & RSFA stent, known occl. LSFA  History of PAD status post left external iliac artery stenting by myself back in 2007 as well as right SFA intervention with a known occluded left SFA.  She is had multiple right lower extremity interventional procedures including a femoropopliteal bypass graft which failed early last year resulting in a right BKA 05/21/2017.  She is currently wearing a prosthesis.  She denies claudication on the left.  Hyperlipidemia History of hyperlipidemia on fenofibrate told by her PCP.  She apparently had side effects from pravastatin and simvastatin.  PAF (paroxysmal atrial fibrillation), maintaing SR History of paroxysmal atrial fibrillation maintaining sinus rhythm flecainide and Eliquis.  Carotid artery disease History of carotid artery disease with Dopplers performed 08/09/2017 revealing moderate left ICA stenosis which we will follow by duplex ultrasound on annual basis.  Essential hypertension History of essential hypertension her blood pressure  measured today 152/50.  Is on hydralazine and lisinopril.  Continue current meds at current dosing.      Stacey Harp MD FACP,FACC,FAHA, [facility] 06/01/2018 9:45 AM

## 2018-06-01 NOTE — Assessment & Plan Note (Addendum)
History of hyperlipidemia on fenofibrate told by her PCP.  She apparently had side effects from pravastatin and simvastatin.

## 2018-06-01 NOTE — Assessment & Plan Note (Signed)
History of paroxysmal atrial fibrillation maintaining sinus rhythm flecainide and Eliquis.

## 2018-06-01 NOTE — Patient Instructions (Signed)
Medication Instructions: Your physician recommends that you continue on your current medications as directed. Please refer to the Current Medication list given to you today.  Testing/Procedures: Your physician has requested that you have a carotid duplex in August. This test is an ultrasound of the carotid arteries in your neck. It looks at blood flow through these arteries that supply the brain with blood. Allow one hour for this exam. There are no restrictions or special instructions.  Follow-Up: Your physician wants you to follow-up in: 1 year with Dr. [name]. You will receive a reminder letter in the mail two months in advance. If you don't receive a letter, please call our office to schedule the follow-up appointment.  If you need a refill on your cardiac medications before your next appointment, please call your pharmacy.

## 2018-06-01 NOTE — Assessment & Plan Note (Signed)
History of PAD status post left external iliac artery stenting by myself back in 2007 as well as right SFA intervention with a known occluded left SFA.  She is had multiple right lower extremity interventional procedures including a femoropopliteal bypass graft which failed early last year resulting in a right BKA 05/21/2017.  She is currently wearing a prosthesis.  She denies claudication on the left.

## 2018-06-01 NOTE — Assessment & Plan Note (Signed)
History of essential hypertension her blood pressure measured today 152/50.  Is on hydralazine and lisinopril.  Continue current meds at current dosing.

## 2018-06-01 NOTE — Assessment & Plan Note (Signed)
>>  ASSESSMENT AND PLAN FOR MIXED HYPERLIPIDEMIA WRITTEN ON 06/01/2018  9:44 AM BY BERRY, JONATHAN J, MD  History of hyperlipidemia on fenofibrate  told by her PCP.  She apparently had side effects from pravastatin  and simvastatin .

## 2018-06-01 NOTE — Assessment & Plan Note (Signed)
History of carotid artery disease with Dopplers performed 08/09/2017 revealing moderate left ICA stenosis which we will follow by duplex ultrasound on annual basis.

## 2018-06-14 ENCOUNTER — Other Ambulatory Visit: Payer: Self-pay

## 2018-06-14 ENCOUNTER — Encounter: Payer: Self-pay | Admitting: Oncology

## 2018-06-14 ENCOUNTER — Ambulatory Visit (INDEPENDENT_AMBULATORY_CARE_PROVIDER_SITE_OTHER): Payer: Medicare Other | Admitting: Oncology

## 2018-06-14 VITALS — BP 183/71 | HR 74 | Temp 98.2°F | Ht 65.5 in | Wt 123.1 lb

## 2018-06-14 DIAGNOSIS — Z7902 Long term (current) use of antithrombotics/antiplatelets: Secondary | ICD-10-CM

## 2018-06-14 DIAGNOSIS — Z9889 Other specified postprocedural states: Secondary | ICD-10-CM | POA: Diagnosis not present

## 2018-06-14 DIAGNOSIS — Z888 Allergy status to other drugs, medicaments and biological substances status: Secondary | ICD-10-CM

## 2018-06-14 DIAGNOSIS — I252 Old myocardial infarction: Secondary | ICD-10-CM | POA: Diagnosis not present

## 2018-06-14 DIAGNOSIS — I2510 Atherosclerotic heart disease of native coronary artery without angina pectoris: Secondary | ICD-10-CM

## 2018-06-14 DIAGNOSIS — D45 Polycythemia vera: Secondary | ICD-10-CM

## 2018-06-14 DIAGNOSIS — D509 Iron deficiency anemia, unspecified: Secondary | ICD-10-CM

## 2018-06-14 DIAGNOSIS — D72829 Elevated white blood cell count, unspecified: Secondary | ICD-10-CM | POA: Diagnosis not present

## 2018-06-14 DIAGNOSIS — Z880 Allergy status to penicillin: Secondary | ICD-10-CM

## 2018-06-14 DIAGNOSIS — Z86718 Personal history of other venous thrombosis and embolism: Secondary | ICD-10-CM

## 2018-06-14 DIAGNOSIS — E785 Hyperlipidemia, unspecified: Secondary | ICD-10-CM

## 2018-06-14 DIAGNOSIS — R011 Cardiac murmur, unspecified: Secondary | ICD-10-CM

## 2018-06-14 DIAGNOSIS — I480 Paroxysmal atrial fibrillation: Secondary | ICD-10-CM

## 2018-06-14 DIAGNOSIS — I739 Peripheral vascular disease, unspecified: Secondary | ICD-10-CM | POA: Diagnosis not present

## 2018-06-14 DIAGNOSIS — Z7982 Long term (current) use of aspirin: Secondary | ICD-10-CM

## 2018-06-14 DIAGNOSIS — H409 Unspecified glaucoma: Secondary | ICD-10-CM

## 2018-06-14 DIAGNOSIS — I743 Embolism and thrombosis of arteries of the lower extremities: Secondary | ICD-10-CM | POA: Diagnosis not present

## 2018-06-14 DIAGNOSIS — Z7901 Long term (current) use of anticoagulants: Secondary | ICD-10-CM | POA: Diagnosis not present

## 2018-06-14 DIAGNOSIS — I679 Cerebrovascular disease, unspecified: Secondary | ICD-10-CM

## 2018-06-14 DIAGNOSIS — Z8673 Personal history of transient ischemic attack (TIA), and cerebral infarction without residual deficits: Secondary | ICD-10-CM

## 2018-06-14 DIAGNOSIS — Z885 Allergy status to narcotic agent status: Secondary | ICD-10-CM

## 2018-06-14 DIAGNOSIS — Z89611 Acquired absence of right leg above knee: Secondary | ICD-10-CM | POA: Diagnosis not present

## 2018-06-14 DIAGNOSIS — Z91011 Allergy to milk products: Secondary | ICD-10-CM

## 2018-06-14 NOTE — Patient Instructions (Signed)
To lab today Repeat lab in 2 months and again in 4 months MD visit with Dr Darnell Level  in 4 months lab same day  Schedule new patient appointment after 12/21/18 with Dr Irene Limbo at [facility]

## 2018-06-14 NOTE — Progress Notes (Signed)
Hematology and Oncology Follow Up Visit  [name] [id number] 12/30/1950 67 y.o. 06/14/2018 12:34 PM   Principle Diagnosis: Encounter Diagnoses  Name Primary?  . Thrombosis of right femoral artery ([place])   . Chronic anticoagulation, new- eliquis   . Polycythemia vera ([place]) Yes  . Leukocytosis, unspecified type   . Iron deficiency anemia, unspecified iron deficiency anemia type   Updated clinical summary: Pleasant 67 year old woman I initially evaluated on 07/15/2015 for unexplained leukocytosis. I initially thought we were dealing with an inflammatory process related to a foreign body reaction to multiple vascular stents, however in reviewing her laboratory data, I noted that she had a chronic anemia with hemoglobins running 12 g with MCV 90 until 03/20/2014 Hemoglobin began to rise first to 14.1 with subsequent values in 2016 up to 16 g recorded on 06/27/2015. She had normal white blood counts back in December 2010. Next white count recorded was in May 2014 at 13,300. Subsequent counts were all 10,000 or above with a count on 07/15/2015 at 17,000 with 85% neutrophils. 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 Her platelet count has been normal. I did not appreciate any organomegaly on my initial exam but on my subsequent exams I was able to palpate an enlarged spleen. discussed the diagnosis of myeloproliferative disorder with her in detail at time of her 08/20/2015 follow-up visit. . Predominant complication would be predisposition towards thrombosis. She was already on aspirin and Xarelto so she is covered in that regard. With respect to thrombotic risk of increased blood viscosity, current guideline recommends keeping hematocrit less than 45%. This  should be accomplished very easily in this woman who is peak hematocrit has not exceeded 52%. We discussed aphlebotomy program.I initiated a phlebotomy program removing of 500 mL's of blood monthly 3 with plans to reevaluate after that. I anticipated seeing a rapid fall in her hematocrit and then be able to space out subsequent phlebotomies to an every 3-4 month interval. She had a good understanding of all we discussed. She had her first phlebotomy Monday, September 02, 2015. She had 4 monthly phlebotomies through 12/02/2015. In February 2017, she underwent a vascular bypass procedure on her right lower extremity and lost a significant amount of blood with hemoglobin falling down as low as 6.6 on February 14. She was given temporary iron supplementation. Hemoglobin rose up to 11 g.  Phlebotomy was held.  She had a re-thrombosis of a right femoral popliteal bypass graft and underwent urgent thrombectomy January 09, 2017.  Hemoglobin back up to 18 g by October 2018 and phlebotomy resumed.  She had a rapid fall in her hemoglobin down to 10.9 g by March 31, 2018 and phlebotomy put back on hold.   Interim History: Further fall in her hemoglobin to 10.2 by April 11.  Ferritin down to 23.  I put her back on an iron supplement which she is taking at the present time. Overall asymptomatic and in good spirits.  No cardiorespiratory complaints.  No GI complaints.  No change in bowel habit.  And considering other reasons why her hemoglobin has fallen so quickly, I looked back and saw that she had a colonoscopy in September 2017 but the sedation wore off in the middle of the procedure which had to be aborted.  Only the sigmoid colon was  visualized.  We discussed rescheduling the procedure in the near future. I am repeating iron studies again today to make sure she is absorbing the oral iron started last month.  Medications: reviewed  Allergies:  Allergies  Allergen Reactions  . Atenolol Rash and  Itching  . Demerol  [Meperidine Hcl] Itching  . Demerol [Meperidine] Other (See Comments) and Itching    Unknown, can't remember  Unknown, can't remember   . Gabapentin Anxiety and Other (See Comments)    SI and HI  . Inderal [Propranolol] Other (See Comments) and Itching    Doesn't remember   . Prednisone Other (See Comments)    Muscle spasms  . Statins Itching and Other (See Comments)    Simvastatin-caused severe itching Pravastatin-caused lesser tiching One type of statin? Caused stroke in 2006, and other symptoms as result  . Warfarin And Related Other (See Comments)    Caused nose bleeds  . Crestor [Rosuvastatin] Itching and Rash  . Docosahexaenoic Acid (Dha)     Other reaction(s): Other (See Comments) nosebleeds  . Lactose Intolerance (Gi) Other (See Comments)    Bloating, gas  . Lipitor [Atorvastatin] Rash  . Lovaza [Omega-3-Acid Ethyl Esters] Other (See Comments)    nosebleeds   . Penicillins Hives, Rash and Other (See Comments)    Has patient had a PCN reaction causing immediate rash, facial/tongue/throat swelling, SOB or lightheadedness with hypotension: Yes Has patient had a PCN reaction causing severe rash involving mucus membranes or skin necrosis: No Has patient had a PCN reaction that required hospitalization: No Has patient had a PCN reaction occurring within the last 10 years: No If all of the above answers are "NO", then may proceed with Cephalosporin use.  Questionable high fever   . Pravastatin Itching and Rash  . Simvastatin Itching, Rash and Other (See Comments)  . Trilipix [Choline Fenofibrate] Other (See Comments)    Doesn't remember   . Warfarin Other (See Comments)    nosebleeds  . Hydralazine Swelling  . Effexor [Venlafaxine Hcl] Other (See Comments)    Doesn't remember   . Nexium [Esomeprazole Magnesium] Rash  . Percocet [Oxycodone-Acetaminophen] Other (See Comments)    Doesn't remember   . Plavix [Clopidogrel Bisulfate] Rash    Review of  Systems: See interim history Remaining ROS negative:   Physical Exam: Blood pressure (!) 183/71, pulse 74, temperature 98.2 F (36.8 C), temperature source Oral, height 5' 5.5" (1.664 m), weight 123 lb 1.6 oz (55.8 kg), SpO2 99 %. Wt Readings from Last 3 Encounters:  06/14/18 123 lb 1.6 oz (55.8 kg)  06/01/18 125 lb (56.7 kg)  05/10/18 131 lb 14.4 oz (59.8 kg)     General appearance: Well-nourished Caucasian woman HENNT: Pharynx no erythema, exudate, mass, or ulcer. No thyromegaly or thyroid nodules Lymph nodes: No cervical, supraclavicular, or axillary lymphadenopathy Breasts: Lungs: Clear to auscultation, resonant to percussion throughout Heart: Regular rhythm, 6-3/0 aortic systolic murmur, no gallop, no rub, no click, no edema Abdomen: Soft, nontender, normal bowel sounds, no mass, no organomegaly Extremities: No edema, no calf tenderness Musculoskeletal: no joint deformities GU:  Vascular: Carotid pulses 2+, bilateral bruits right louder than left, distal pulses: Dorsalis pedis absent left foot.  Status post right above the knee amputation Neurologic: Alert, oriented, PERRLA, optic discs sharp and vessels normal, no hemorrhage or exudate, cranial nerves grossly normal, motor strength 5 over 5, reflexes 1+ symmetric at the biceps, upper body coordination normal, gait not tested Skin: No rash, no ecchymosis  Lab Results:  CBC W/Diff    Component Value Date/Time   WBC 23.9 (H) 05/10/2018 1142   RBC 5.86 (H) 05/10/2018 1142   HGB 10.3 (L) 05/10/2018 1142   HGB 14.3 12/31/2017 1119   HCT 38.4 05/10/2018 1142   HCT 46.8 (H) 12/31/2017 1119   PLT 490 (H) 05/10/2018 1142   PLT 407 (H) 12/31/2017 1119   MCV 65.5 (L) 05/10/2018 1142   MCV 70 (L) 12/31/2017 1119   MCH 17.6 (L) 05/10/2018 1142   MCHC 26.8 (L) 05/10/2018 1142   RDW 22.5 (H) 05/10/2018 1142   RDW 19.4 (H) 12/31/2017 1119   LYMPHSABS 1.4 05/10/2018 1142   LYMPHSABS 2.8 12/31/2017 1119   MONOABS 0.5 05/10/2018 1142    EOSABS 0.7 05/10/2018 1142   EOSABS 1.1 (H) 12/31/2017 1119   BASOSABS 0.2 (H) 05/10/2018 1142   BASOSABS 0.6 (H) 12/31/2017 1119     Chemistry      Component Value Date/Time   NA 137 09/22/2017 1835   NA 140 07/06/2017 1113   K 4.7 09/22/2017 1835   CL 100 (L) 09/22/2017 1835   CO2 26 09/22/2017 1835   BUN 17 09/22/2017 1835   BUN 30 (H) 07/06/2017 1113   CREATININE 0.79 09/22/2017 1835   CREATININE 1.33 (H) 03/03/2016 1531      Component Value Date/Time   CALCIUM 9.5 09/22/2017 1835   ALKPHOS 71 07/06/2017 1113   AST 22 07/06/2017 1113   ALT 18 07/06/2017 1113   BILITOT <0.2 07/06/2017 1113    Lab pending   Radiological Studies: No results found.  Impression:  1.  Polycythemia vera Holding phlebotomy in view of microcytic iron deficiency anemia.  Monitoring response to oral iron supplementation.  No change in chronic leukocytosis related to this stem cell disorder.  2.  Peripheral vascular disease status post bypass surgery status post right above the knee amputation  3.  Cerebrovascular disease.  History of prior TIA. Bilateral carotid bruits.  4.  Coronary artery disease status post MI May 2014  5.  Paroxysmal atrial fibrillation on long-term anticoagulation with Eliquis  6.  Hyperlipidemia  7.  Glaucoma  CC: Patient Care Team: Dione Housekeeper, MD as PCP - General (Family Medicine) Lorretta Harp, MD as Consulting Physician (Cardiology) Annia Belt, MD as Consulting Physician (Oncology) [name], MD as Consulting Physician (Vascular Surgery)   Murriel Hopper, MD, [place]  Hematology-Oncology/Internal Medicine     6/25/201912:34 PM

## 2018-06-15 ENCOUNTER — Telehealth: Payer: Self-pay | Admitting: *Deleted

## 2018-06-15 ENCOUNTER — Other Ambulatory Visit: Payer: Self-pay | Admitting: Oncology

## 2018-06-15 LAB — COMPREHENSIVE METABOLIC PANEL
A/G RATIO: 1.7 (ref 1.2–2.2)
ALT: 8 IU/L (ref 0–32)
AST: 17 IU/L (ref 0–40)
Albumin: 4.2 g/dL (ref 3.6–4.8)
Alkaline Phosphatase: 95 IU/L (ref 39–117)
BUN/Creatinine Ratio: 15 (ref 12–28)
BUN: 11 mg/dL (ref 8–27)
Bilirubin Total: 0.2 mg/dL (ref 0.0–1.2)
CALCIUM: 9.2 mg/dL (ref 8.7–10.3)
CO2: 25 mmol/L (ref 20–29)
CREATININE: 0.71 mg/dL (ref 0.57–1.00)
Chloride: 103 mmol/L (ref 96–106)
GFR calc non Af Amer: 89 mL/min/{1.73_m2} (ref >59)
GFR, EST AFRICAN AMERICAN: 103 mL/min/{1.73_m2} (ref >59)
GLOBULIN, TOTAL: 2.5 g/dL (ref 1.5–4.5)
Glucose: 57 mg/dL — ABNORMAL LOW (ref 65–99)
POTASSIUM: 5 mmol/L (ref 3.5–5.2)
Sodium: 144 mmol/L (ref 134–144)
TOTAL PROTEIN: 6.7 g/dL (ref 6.0–8.5)

## 2018-06-15 LAB — CBC WITH DIFFERENTIAL/PLATELET
BASOS: 1 %
Basophils Absolute: 0.3 10*3/uL — ABNORMAL HIGH (ref 0.0–0.2)
EOS (ABSOLUTE): 0.6 10*3/uL — AB (ref 0.0–0.4)
Eos: 3 %
HEMATOCRIT: 45.1 % (ref 34.0–46.6)
Hemoglobin: 13.7 g/dL (ref 11.1–15.9)
IMMATURE GRANS (ABS): 0.6 10*3/uL — AB (ref 0.0–0.1)
IMMATURE GRANULOCYTES: 3 %
LYMPHS: 6 %
Lymphocytes Absolute: 1.3 10*3/uL (ref 0.7–3.1)
MCH: 21.7 pg — ABNORMAL LOW (ref 26.6–33.0)
MCHC: 30.4 g/dL — AB (ref 31.5–35.7)
MCV: 72 fL — AB (ref 79–97)
Monocytes Absolute: 0.5 10*3/uL (ref 0.1–0.9)
Monocytes: 2 %
NEUTROS ABS: 18 10*3/uL — AB (ref 1.4–7.0)
NEUTROS PCT: 85 %
PLATELETS: 363 10*3/uL (ref 150–450)
RBC: 6.3 x10E6/uL — ABNORMAL HIGH (ref 3.77–5.28)
RDW: 25.8 % — AB (ref 12.3–15.4)
WBC: 21.3 10*3/uL (ref 3.4–10.8)

## 2018-06-15 LAB — FERRITIN: FERRITIN: 90 ng/mL (ref 15–150)

## 2018-06-15 LAB — URIC ACID: URIC ACID: 10.7 mg/dL — AB (ref 2.5–7.1)

## 2018-06-15 LAB — LACTATE DEHYDROGENASE: LDH: 534 IU/L — AB (ref 119–226)

## 2018-06-15 MED ORDER — ALLOPURINOL 300 MG PO TABS: 300.0000 mg | ORAL_TABLET | Freq: Every day | ORAL | 3 refills | Status: DC

## 2018-06-15 NOTE — Telephone Encounter (Signed)
-----   Message from Annia Belt, MD sent at 06/15/2018 10:01 AM EDT ----- Call pt: blood count back up to 13.7. Can stop iron. Uric acid, a waste product of cell metabolism, is elevated. I would like her to start allopurinol 300 mg once daily. I will send Rx to her pharmacy.

## 2018-06-15 NOTE — Telephone Encounter (Signed)
Pt called - no answer; left message to give me a call back.

## 2018-06-15 NOTE — Telephone Encounter (Signed)
Pt called / informed "blood count back up to 13.7. Can stop iron. Uric acid, a waste product of cell metabolism, is elevated. I would like her to start allopurinol 300 mg once daily. I will send Rx to her pharmacy." per Dr Beryle Beams.  Stated iron pills must be working; and she had a bout of gout recently in her feet.

## 2018-07-28 ENCOUNTER — Encounter: Payer: Medicare Other | Admitting: Physical Medicine & Rehabilitation

## 2018-08-02 ENCOUNTER — Ambulatory Visit (HOSPITAL_COMMUNITY)
Admission: RE | Admit: 2018-08-02 | Discharge: 2018-08-02 | Disposition: A | Discharge status: Home / Self Care | Payer: Medicare Other | Source: Ambulatory Visit | Attending: Cardiology | Admitting: Cardiology

## 2018-08-02 DIAGNOSIS — I6529 Occlusion and stenosis of unspecified carotid artery: Secondary | ICD-10-CM | POA: Diagnosis present

## 2018-08-03 ENCOUNTER — Other Ambulatory Visit: Payer: Self-pay | Admitting: *Deleted

## 2018-08-03 DIAGNOSIS — I6529 Occlusion and stenosis of unspecified carotid artery: Secondary | ICD-10-CM

## 2018-09-06 DIAGNOSIS — F1190 Opioid use, unspecified, uncomplicated: Secondary | ICD-10-CM | POA: Insufficient documentation

## 2018-09-09 ENCOUNTER — Other Ambulatory Visit: Payer: Self-pay

## 2018-09-09 ENCOUNTER — Encounter (HOSPITAL_COMMUNITY): Payer: Self-pay | Admitting: Emergency Medicine

## 2018-09-09 ENCOUNTER — Inpatient Hospital Stay (HOSPITAL_COMMUNITY): Payer: Medicare Other

## 2018-09-09 ENCOUNTER — Emergency Department (HOSPITAL_COMMUNITY): Payer: Medicare Other

## 2018-09-09 ENCOUNTER — Inpatient Hospital Stay (HOSPITAL_COMMUNITY)
Admission: EM | Admit: 2018-09-09 | Discharge: 2018-09-16 | DRG: 552 | Disposition: A | Discharge status: Skilled Nursing Facility | Payer: Medicare Other | Attending: General Surgery | Admitting: General Surgery

## 2018-09-09 DIAGNOSIS — Z7982 Long term (current) use of aspirin: Secondary | ICD-10-CM

## 2018-09-09 DIAGNOSIS — Y92410 Unspecified street and highway as the place of occurrence of the external cause: Secondary | ICD-10-CM

## 2018-09-09 DIAGNOSIS — D45 Polycythemia vera: Secondary | ICD-10-CM | POA: Diagnosis not present

## 2018-09-09 DIAGNOSIS — S8012XA Contusion of left lower leg, initial encounter: Secondary | ICD-10-CM | POA: Diagnosis not present

## 2018-09-09 DIAGNOSIS — S329XXA Fracture of unspecified parts of lumbosacral spine and pelvis, initial encounter for closed fracture: Secondary | ICD-10-CM

## 2018-09-09 DIAGNOSIS — R339 Retention of urine, unspecified: Secondary | ICD-10-CM | POA: Diagnosis not present

## 2018-09-09 DIAGNOSIS — T1490XA Injury, unspecified, initial encounter: Secondary | ICD-10-CM

## 2018-09-09 DIAGNOSIS — K760 Fatty (change of) liver, not elsewhere classified: Secondary | ICD-10-CM | POA: Diagnosis present

## 2018-09-09 DIAGNOSIS — D62 Acute posthemorrhagic anemia: Secondary | ICD-10-CM | POA: Diagnosis present

## 2018-09-09 DIAGNOSIS — I10 Essential (primary) hypertension: Secondary | ICD-10-CM | POA: Diagnosis not present

## 2018-09-09 DIAGNOSIS — I252 Old myocardial infarction: Secondary | ICD-10-CM

## 2018-09-09 DIAGNOSIS — S0990XA Unspecified injury of head, initial encounter: Secondary | ICD-10-CM | POA: Diagnosis present

## 2018-09-09 DIAGNOSIS — I959 Hypotension, unspecified: Secondary | ICD-10-CM

## 2018-09-09 DIAGNOSIS — K219 Gastro-esophageal reflux disease without esophagitis: Secondary | ICD-10-CM | POA: Diagnosis present

## 2018-09-09 DIAGNOSIS — S2241XA Multiple fractures of ribs, right side, initial encounter for closed fracture: Secondary | ICD-10-CM | POA: Diagnosis present

## 2018-09-09 DIAGNOSIS — H409 Unspecified glaucoma: Secondary | ICD-10-CM | POA: Diagnosis not present

## 2018-09-09 DIAGNOSIS — Z89611 Acquired absence of right leg above knee: Secondary | ICD-10-CM

## 2018-09-09 DIAGNOSIS — M19041 Primary osteoarthritis, right hand: Secondary | ICD-10-CM | POA: Diagnosis present

## 2018-09-09 DIAGNOSIS — Z801 Family history of malignant neoplasm of trachea, bronchus and lung: Secondary | ICD-10-CM | POA: Diagnosis not present

## 2018-09-09 DIAGNOSIS — M170 Bilateral primary osteoarthritis of knee: Secondary | ICD-10-CM | POA: Diagnosis present

## 2018-09-09 DIAGNOSIS — I2510 Atherosclerotic heart disease of native coronary artery without angina pectoris: Secondary | ICD-10-CM | POA: Diagnosis present

## 2018-09-09 DIAGNOSIS — K5900 Constipation, unspecified: Secondary | ICD-10-CM | POA: Diagnosis present

## 2018-09-09 DIAGNOSIS — Z7901 Long term (current) use of anticoagulants: Secondary | ICD-10-CM | POA: Diagnosis not present

## 2018-09-09 DIAGNOSIS — Z800 Family history of malignant neoplasm of digestive organs: Secondary | ICD-10-CM

## 2018-09-09 DIAGNOSIS — S60222A Contusion of left hand, initial encounter: Secondary | ICD-10-CM | POA: Diagnosis not present

## 2018-09-09 DIAGNOSIS — S2249XA Multiple fractures of ribs, unspecified side, initial encounter for closed fracture: Secondary | ICD-10-CM | POA: Diagnosis present

## 2018-09-09 DIAGNOSIS — Z87891 Personal history of nicotine dependence: Secondary | ICD-10-CM

## 2018-09-09 DIAGNOSIS — I340 Nonrheumatic mitral (valve) insufficiency: Secondary | ICD-10-CM | POA: Diagnosis present

## 2018-09-09 DIAGNOSIS — D72829 Elevated white blood cell count, unspecified: Secondary | ICD-10-CM | POA: Diagnosis not present

## 2018-09-09 DIAGNOSIS — S32119A Unspecified Zone I fracture of sacrum, initial encounter for closed fracture: Principal | ICD-10-CM | POA: Diagnosis present

## 2018-09-09 DIAGNOSIS — S2239XA Fracture of one rib, unspecified side, initial encounter for closed fracture: Secondary | ICD-10-CM | POA: Diagnosis present

## 2018-09-09 DIAGNOSIS — G8929 Other chronic pain: Secondary | ICD-10-CM | POA: Diagnosis not present

## 2018-09-09 DIAGNOSIS — Z961 Presence of intraocular lens: Secondary | ICD-10-CM | POA: Diagnosis present

## 2018-09-09 DIAGNOSIS — S82142A Displaced bicondylar fracture of left tibia, initial encounter for closed fracture: Secondary | ICD-10-CM | POA: Diagnosis not present

## 2018-09-09 DIAGNOSIS — Z888 Allergy status to other drugs, medicaments and biological substances status: Secondary | ICD-10-CM

## 2018-09-09 DIAGNOSIS — M19042 Primary osteoarthritis, left hand: Secondary | ICD-10-CM | POA: Diagnosis present

## 2018-09-09 DIAGNOSIS — E785 Hyperlipidemia, unspecified: Secondary | ICD-10-CM | POA: Diagnosis not present

## 2018-09-09 DIAGNOSIS — Z9851 Tubal ligation status: Secondary | ICD-10-CM

## 2018-09-09 DIAGNOSIS — S3210XA Unspecified fracture of sacrum, initial encounter for closed fracture: Secondary | ICD-10-CM

## 2018-09-09 DIAGNOSIS — R079 Chest pain, unspecified: Secondary | ICD-10-CM | POA: Diagnosis present

## 2018-09-09 DIAGNOSIS — Z885 Allergy status to narcotic agent status: Secondary | ICD-10-CM

## 2018-09-09 DIAGNOSIS — Z9842 Cataract extraction status, left eye: Secondary | ICD-10-CM

## 2018-09-09 DIAGNOSIS — Z23 Encounter for immunization: Secondary | ICD-10-CM | POA: Diagnosis present

## 2018-09-09 DIAGNOSIS — Z8249 Family history of ischemic heart disease and other diseases of the circulatory system: Secondary | ICD-10-CM | POA: Diagnosis not present

## 2018-09-09 DIAGNOSIS — I480 Paroxysmal atrial fibrillation: Secondary | ICD-10-CM | POA: Diagnosis present

## 2018-09-09 DIAGNOSIS — S32592A Other specified fracture of left pubis, initial encounter for closed fracture: Secondary | ICD-10-CM | POA: Diagnosis present

## 2018-09-09 DIAGNOSIS — M79672 Pain in left foot: Secondary | ICD-10-CM

## 2018-09-09 DIAGNOSIS — Z880 Allergy status to penicillin: Secondary | ICD-10-CM

## 2018-09-09 DIAGNOSIS — Z8673 Personal history of transient ischemic attack (TIA), and cerebral infarction without residual deficits: Secondary | ICD-10-CM

## 2018-09-09 DIAGNOSIS — Z9841 Cataract extraction status, right eye: Secondary | ICD-10-CM

## 2018-09-09 HISTORY — DX: Fracture of unspecified parts of lumbosacral spine and pelvis, initial encounter for closed fracture: S32.9XXA

## 2018-09-09 LAB — COMPREHENSIVE METABOLIC PANEL
ALBUMIN: 3.3 g/dL — AB (ref 3.5–5.0)
ALT: 16 U/L (ref 0–44)
ANION GAP: 7 (ref 5–15)
AST: 33 U/L (ref 15–41)
Alkaline Phosphatase: 73 U/L (ref 38–126)
BILIRUBIN TOTAL: 0.6 mg/dL (ref 0.3–1.2)
BUN: 22 mg/dL (ref 8–23)
CALCIUM: 8.3 mg/dL — AB (ref 8.9–10.3)
CO2: 25 mmol/L (ref 22–32)
Chloride: 108 mmol/L (ref 98–111)
Creatinine, Ser: 0.82 mg/dL (ref 0.44–1.00)
GFR calc non Af Amer: >60 mL/min (ref >60)
GLUCOSE: 101 mg/dL — AB (ref 70–99)
POTASSIUM: 4.7 mmol/L (ref 3.5–5.1)
SODIUM: 140 mmol/L (ref 135–145)
TOTAL PROTEIN: 6.3 g/dL — AB (ref 6.5–8.1)

## 2018-09-09 LAB — CBC
HCT: 39.8 % (ref 36.0–46.0)
HEMATOCRIT: 40.4 % (ref 36.0–46.0)
HEMOGLOBIN: 11 g/dL — AB (ref 12.0–15.0)
Hemoglobin: 11.4 g/dL — ABNORMAL LOW (ref 12.0–15.0)
MCH: 19.8 pg — AB (ref 26.0–34.0)
MCH: 19.4 pg — ABNORMAL LOW (ref 26.0–34.0)
MCHC: 28.6 g/dL — ABNORMAL LOW (ref 30.0–36.0)
MCHC: 27.2 g/dL — ABNORMAL LOW (ref 30.0–36.0)
MCV: 69 fL — ABNORMAL LOW (ref 78.0–100.0)
MCV: 71.3 fL — ABNORMAL LOW (ref 78.0–100.0)
Platelets: 378 10*3/uL (ref 150–400)
Platelets: 453 10*3/uL — ABNORMAL HIGH (ref 150–400)
RBC: 5.77 MIL/uL — ABNORMAL HIGH (ref 3.87–5.11)
RBC: 5.67 MIL/uL — ABNORMAL HIGH (ref 3.87–5.11)
RDW: 20.3 % — AB (ref 11.5–15.5)
RDW: 21.7 % — ABNORMAL HIGH (ref 11.5–15.5)
WBC: 28.8 10*3/uL — ABNORMAL HIGH (ref 4.0–10.5)
WBC: 35.3 10*3/uL — AB (ref 4.0–10.5)

## 2018-09-09 LAB — PROTIME-INR
INR: 1.36
INR: 1.28
PROTHROMBIN TIME: 16.7 s — AB (ref 11.4–15.2)
Prothrombin Time: 15.9 seconds — ABNORMAL HIGH (ref 11.4–15.2)

## 2018-09-09 LAB — APTT: aPTT: 49 seconds — ABNORMAL HIGH (ref 24–36)

## 2018-09-09 LAB — TYPE AND SCREEN
ABO/RH(D): A POS
ANTIBODY SCREEN: NEGATIVE

## 2018-09-09 LAB — I-STAT CG4 LACTIC ACID, ED: Lactic Acid, Venous: 0.73 mmol/L (ref 0.5–1.9)

## 2018-09-09 LAB — I-STAT TROPONIN, ED: Troponin i, poc: 0.03 ng/mL (ref 0.00–0.08)

## 2018-09-09 LAB — MRSA PCR SCREENING: MRSA BY PCR: NEGATIVE
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
## 2018-09-09 MED ORDER — DOCUSATE SODIUM 100 MG PO CAPS
100.0000 mg | ORAL_CAPSULE | Freq: Two times a day (BID) | ORAL | Status: DC
  Administered 2018-09-11 – 2018-09-16 (×11): 100 mg via ORAL
  Filled 2018-09-09 (×14): qty 1

## 2018-09-09 MED ORDER — SODIUM CHLORIDE 0.9 % IV BOLUS
1000.0000 mL | Freq: Once | INTRAVENOUS | Status: AC
  Administered 2018-09-09: 1000 mL via INTRAVENOUS

## 2018-09-09 MED ORDER — BISACODYL 10 MG RE SUPP: 10.0000 mg | Freq: Every day | RECTAL | Status: DC | PRN

## 2018-09-09 MED ORDER — DILTIAZEM HCL ER COATED BEADS 180 MG PO CP24
180.0000 mg | ORAL_CAPSULE | Freq: Every morning | ORAL | Status: DC
  Administered 2018-09-10 – 2018-09-16 (×7): 180 mg via ORAL
  Filled 2018-09-09 (×7): qty 1

## 2018-09-09 MED ORDER — FENTANYL CITRATE (PF) 100 MCG/2ML IJ SOLN
INTRAMUSCULAR | Status: AC
  Administered 2018-09-09: 100 ug
  Filled 2018-09-09: qty 2

## 2018-09-09 MED ORDER — ONDANSETRON HCL 4 MG/2ML IJ SOLN: 4.0000 mg | Freq: Four times a day (QID) | INTRAMUSCULAR | Status: DC | PRN

## 2018-09-09 MED ORDER — PANTOPRAZOLE SODIUM 40 MG PO TBEC
40.0000 mg | DELAYED_RELEASE_TABLET | Freq: Every day | ORAL | Status: DC
  Administered 2018-09-09 – 2018-09-16 (×8): 40 mg via ORAL
  Filled 2018-09-09 (×8): qty 1

## 2018-09-09 MED ORDER — FENTANYL CITRATE (PF) 100 MCG/2ML IJ SOLN
12.5000 ug | Freq: Once | INTRAMUSCULAR | Status: AC
  Administered 2018-09-09: 12.5 ug via INTRAVENOUS
  Filled 2018-09-09: qty 2

## 2018-09-09 MED ORDER — SODIUM CHLORIDE 0.9 % IV SOLN
INTRAVENOUS | Status: DC
  Administered 2018-09-09 – 2018-09-10 (×2): via INTRAVENOUS

## 2018-09-09 MED ORDER — LISINOPRIL 20 MG PO TABS
20.0000 mg | ORAL_TABLET | Freq: Two times a day (BID) | ORAL | Status: DC
  Administered 2018-09-09 – 2018-09-16 (×14): 20 mg via ORAL
  Filled 2018-09-09 (×14): qty 1

## 2018-09-09 MED ORDER — TETANUS-DIPHTH-ACELL PERTUSSIS 5-2.5-18.5 LF-MCG/0.5 IM SUSP
0.5000 mL | Freq: Once | INTRAMUSCULAR | Status: AC
  Administered 2018-09-09: 0.5 mL via INTRAMUSCULAR
  Filled 2018-09-09: qty 0.5

## 2018-09-09 MED ORDER — IOPAMIDOL (ISOVUE-370) INJECTION 76%
100.0000 mL | Freq: Once | INTRAVENOUS | Status: AC | PRN
  Administered 2018-09-09: 100 mL via INTRAVENOUS

## 2018-09-09 MED ORDER — POLYETHYLENE GLYCOL 3350 17 G PO PACK
17.0000 g | PACK | Freq: Every day | ORAL | Status: DC
  Administered 2018-09-11: 17 g via ORAL
  Filled 2018-09-09 (×3): qty 1

## 2018-09-09 MED ORDER — TRAMADOL HCL 50 MG PO TABS
50.0000 mg | ORAL_TABLET | Freq: Four times a day (QID) | ORAL | Status: DC | PRN
  Administered 2018-09-09 – 2018-09-11 (×3): 50 mg via ORAL
  Filled 2018-09-09 (×3): qty 1

## 2018-09-09 MED ORDER — FLECAINIDE ACETATE 50 MG PO TABS
50.0000 mg | ORAL_TABLET | Freq: Two times a day (BID) | ORAL | Status: DC
  Administered 2018-09-09 – 2018-09-16 (×14): 50 mg via ORAL
  Filled 2018-09-09 (×16): qty 1

## 2018-09-09 MED ORDER — ACETAMINOPHEN 500 MG PO TABS
1000.0000 mg | ORAL_TABLET | Freq: Four times a day (QID) | ORAL | Status: DC | PRN
  Administered 2018-09-10: 1000 mg via ORAL
  Filled 2018-09-09: qty 2

## 2018-09-09 MED ORDER — DOCUSATE SODIUM 100 MG PO CAPS: 200.0000 mg | ORAL_CAPSULE | Freq: Every evening | ORAL | Status: DC

## 2018-09-09 MED ORDER — MORPHINE SULFATE (PF) 2 MG/ML IV SOLN
1.0000 mg | INTRAVENOUS | Status: DC | PRN
  Administered 2018-09-09 – 2018-09-12 (×13): 2 mg via INTRAVENOUS
  Filled 2018-09-09 (×13): qty 1

## 2018-09-09 MED ORDER — CYCLOBENZAPRINE HCL 10 MG PO TABS
5.0000 mg | ORAL_TABLET | Freq: Three times a day (TID) | ORAL | Status: DC
  Administered 2018-09-10 – 2018-09-14 (×13): 5 mg via ORAL
  Filled 2018-09-09 (×15): qty 1

## 2018-09-09 MED ORDER — ONDANSETRON 4 MG PO TBDP: 4.0000 mg | ORAL_TABLET | Freq: Four times a day (QID) | ORAL | Status: DC | PRN

## 2018-09-09 MED ORDER — FENTANYL CITRATE (PF) 100 MCG/2ML IJ SOLN
25.0000 ug | Freq: Once | INTRAMUSCULAR | Status: AC
  Administered 2018-09-09: 25 ug via INTRAVENOUS
  Filled 2018-09-09: qty 2
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
## 2018-09-09 NOTE — ED Notes (Signed)
ED Provider at bedside. 
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
## 2018-09-10 ENCOUNTER — Inpatient Hospital Stay (HOSPITAL_COMMUNITY): Payer: Medicare Other

## 2018-09-10 LAB — BASIC METABOLIC PANEL
Anion gap: 10 (ref 5–15)
BUN: 19 mg/dL (ref 8–23)
CO2: 23 mmol/L (ref 22–32)
CREATININE: 0.86 mg/dL (ref 0.44–1.00)
Calcium: 8.1 mg/dL — ABNORMAL LOW (ref 8.9–10.3)
Chloride: 103 mmol/L (ref 98–111)
GFR calc Af Amer: >60 mL/min (ref >60)
Glucose, Bld: 117 mg/dL — ABNORMAL HIGH (ref 70–99)
POTASSIUM: 4.1 mmol/L (ref 3.5–5.1)
SODIUM: 136 mmol/L (ref 135–145)

## 2018-09-10 LAB — CBC
HCT: 36.8 % (ref 36.0–46.0)
HEMOGLOBIN: 10.3 g/dL — AB (ref 12.0–15.0)
MCH: 19.6 pg — AB (ref 26.0–34.0)
MCHC: 28 g/dL — ABNORMAL LOW (ref 30.0–36.0)
MCV: 70.1 fL — AB (ref 78.0–100.0)
Platelets: 428 10*3/uL — ABNORMAL HIGH (ref 150–400)
RBC: 5.25 MIL/uL — ABNORMAL HIGH (ref 3.87–5.11)
RDW: 21.4 % — ABNORMAL HIGH (ref 11.5–15.5)
WBC: 29.3 10*3/uL — ABNORMAL HIGH (ref 4.0–10.5)

## 2018-09-10 LAB — PROTIME-INR
INR: 1.23
Prothrombin Time: 15.4 seconds — ABNORMAL HIGH (ref 11.4–15.2)

## 2018-09-10 IMAGING — DX DG CHEST 1V PORT
1 series · 1 of 1 positions shown · non-contrast
Comparison: CTA chest dated [DATE]

CLINICAL DATA: Rib fractures

EXAM:
PORTABLE CHEST 1 VIEW

[chest]
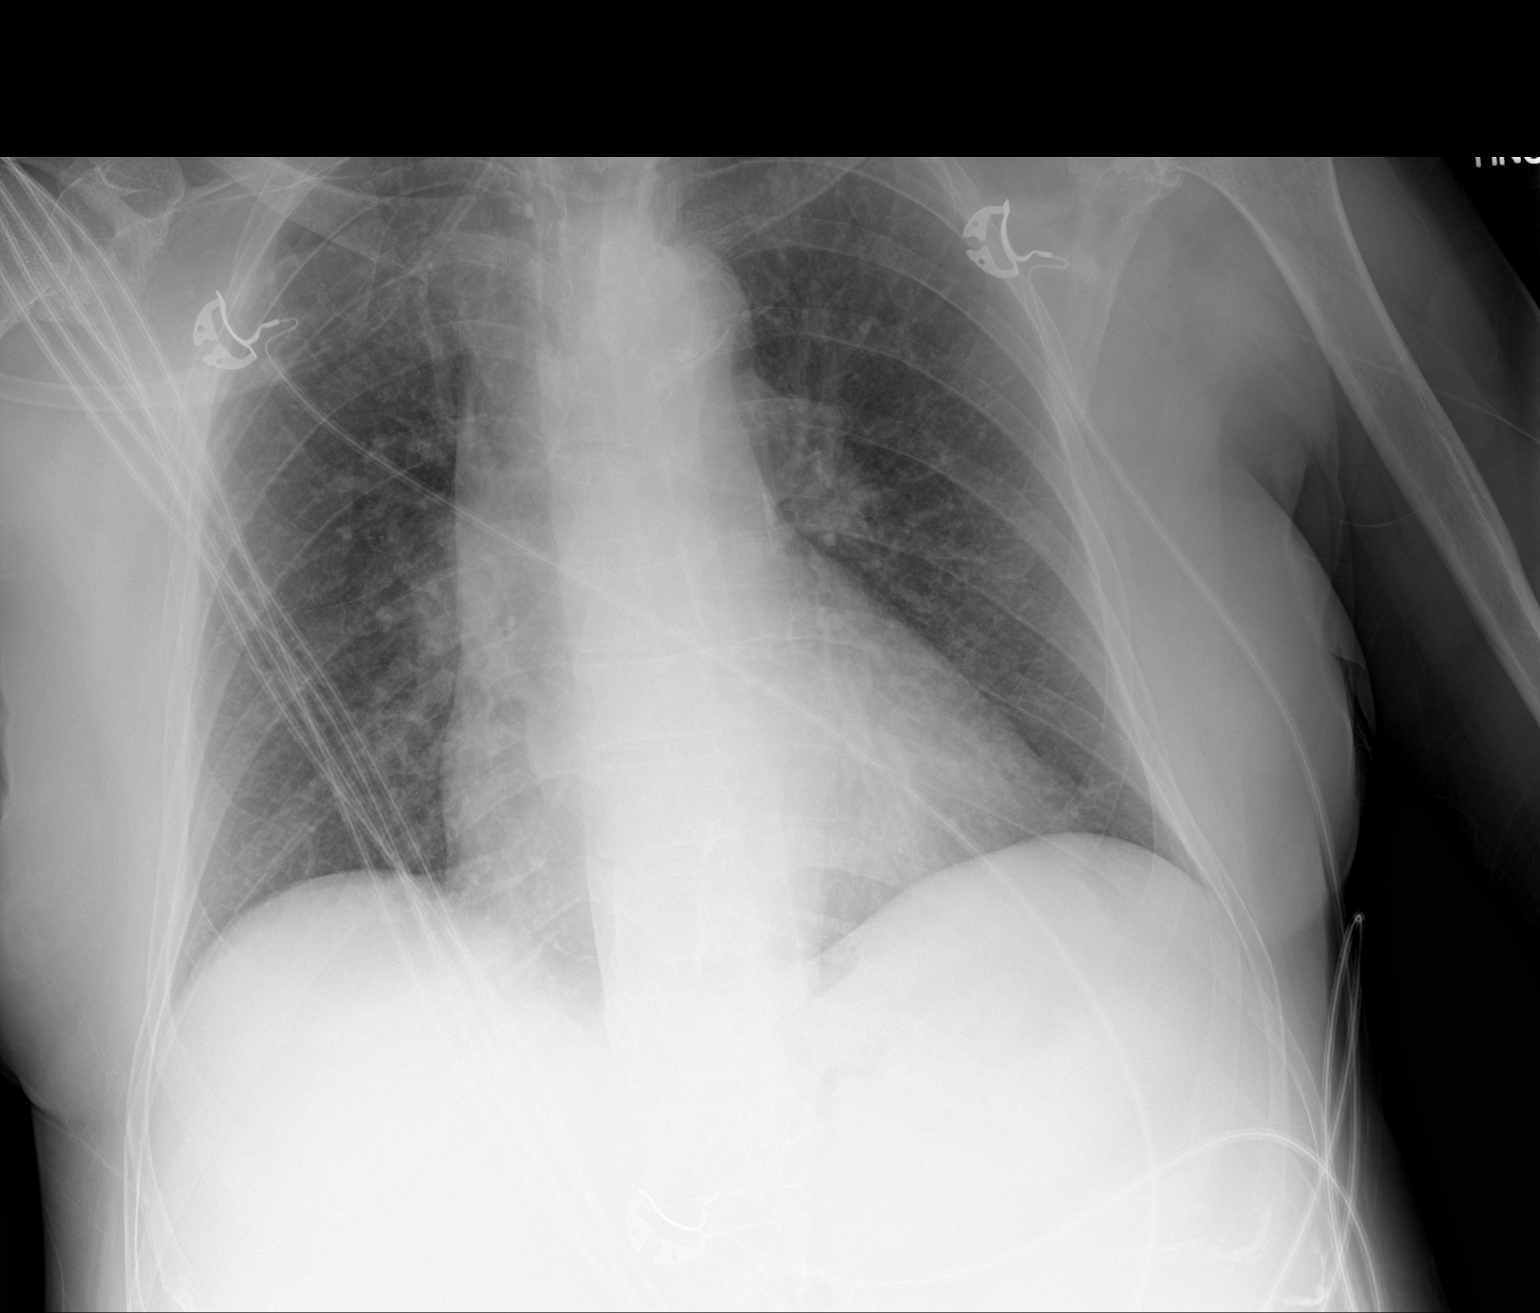

[1 of 1 positions shown; findings below may reference images not displayed]

FINDINGS: Lungs are clear.  No pleural effusion or pneumothorax.

The heart is normal in size.

The patient's known left anterolateral 3rd, 4th, and 5th rib
fractures are better visualized on CT.
IMPRESSION: Patient's known left 3rd through 5th rib fractures are better
visualized on CT.

No pneumothorax is seen.

## 2018-09-10 MED ORDER — BETHANECHOL CHLORIDE 10 MG PO TABS
10.0000 mg | ORAL_TABLET | Freq: Three times a day (TID) | ORAL | Status: DC
  Administered 2018-09-10 – 2018-09-12 (×9): 10 mg via ORAL
  Filled 2018-09-10 (×10): qty 1

## 2018-09-10 MED ORDER — OXYCODONE HCL 5 MG PO TABS
5.0000 mg | ORAL_TABLET | ORAL | Status: DC | PRN
  Administered 2018-09-10 – 2018-09-13 (×9): 10 mg via ORAL
  Administered 2018-09-13: 5 mg via ORAL
  Administered 2018-09-13: 10 mg via ORAL
  Administered 2018-09-13: 5 mg via ORAL
  Administered 2018-09-14 – 2018-09-16 (×9): 10 mg via ORAL
  Filled 2018-09-10 (×4): qty 2
  Filled 2018-09-10: qty 1
  Filled 2018-09-10 (×15): qty 2
  Filled 2018-09-10: qty 1
  Filled 2018-09-10 (×2): qty 2
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
## 2018-09-10 NOTE — Progress Notes (Signed)
Rehab Admissions Coordinator Note:  Per PT note, patient was screened by Jhonnie Garner for appropriateness for an Inpatient Acute Rehab Consult.  At this time, we are recommending Inpatient Rehab consult. AC will contact MD regarding IP Rehab Consult Order.  Jhonnie Garner 09/10/2018, 11:54 AM  I can be reached at [phone].
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
## 2018-09-11 ENCOUNTER — Inpatient Hospital Stay (HOSPITAL_COMMUNITY): Payer: Medicare Other

## 2018-09-11 LAB — BASIC METABOLIC PANEL
Anion gap: 11 (ref 5–15)
BUN: 20 mg/dL (ref 8–23)
CHLORIDE: 102 mmol/L (ref 98–111)
CO2: 22 mmol/L (ref 22–32)
Calcium: 8.2 mg/dL — ABNORMAL LOW (ref 8.9–10.3)
Creatinine, Ser: 0.86 mg/dL (ref 0.44–1.00)
GFR calc Af Amer: >60 mL/min (ref >60)
GFR calc non Af Amer: >60 mL/min (ref >60)
Glucose, Bld: 98 mg/dL (ref 70–99)
POTASSIUM: 4.4 mmol/L (ref 3.5–5.1)
SODIUM: 135 mmol/L (ref 135–145)

## 2018-09-11 LAB — CBC
HEMATOCRIT: 33.4 % — AB (ref 36.0–46.0)
HEMOGLOBIN: 9.4 g/dL — AB (ref 12.0–15.0)
MCH: 19.7 pg — ABNORMAL LOW (ref 26.0–34.0)
MCHC: 28.1 g/dL — ABNORMAL LOW (ref 30.0–36.0)
MCV: 69.9 fL — ABNORMAL LOW (ref 78.0–100.0)
Platelets: 405 10*3/uL — ABNORMAL HIGH (ref 150–400)
RBC: 4.78 MIL/uL (ref 3.87–5.11)
RDW: 20.4 % — ABNORMAL HIGH (ref 11.5–15.5)
WBC: 24.9 10*3/uL — ABNORMAL HIGH (ref 4.0–10.5)
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
## 2018-09-11 MED ORDER — KETOROLAC TROMETHAMINE 30 MG/ML IJ SOLN
30.0000 mg | Freq: Once | INTRAMUSCULAR | Status: AC
  Administered 2018-09-11: 30 mg via INTRAVENOUS
  Filled 2018-09-11: qty 1

## 2018-09-11 MED ORDER — TRAMADOL HCL 50 MG PO TABS
50.0000 mg | ORAL_TABLET | Freq: Four times a day (QID) | ORAL | Status: DC
  Administered 2018-09-11 – 2018-09-12 (×5): 50 mg via ORAL
  Filled 2018-09-11 (×5): qty 1

## 2018-09-11 MED ORDER — ACETAMINOPHEN 500 MG PO TABS
1000.0000 mg | ORAL_TABLET | Freq: Three times a day (TID) | ORAL | Status: DC
  Administered 2018-09-11 – 2018-09-12 (×4): 1000 mg via ORAL
  Filled 2018-09-11 (×4): qty 2

## 2018-09-11 NOTE — Progress Notes (Signed)
Dressing at Left calf changed prior to application of knee immobiliaer. vaseline gauze, gauze wrap, kerlex wrap used.
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
## 2018-09-11 NOTE — Progress Notes (Signed)
Orthopedic Tech Progress Note Patient Details:  [name] 1951/09/18 [id number]  Ortho Devices Type of Ortho Device: Knee Immobilizer Ortho Device/Splint Interventions: Application   Post Interventions Patient Tolerated: Well Instructions Provided: Care of device   [name] 09/11/2018, 6:00 PM

## 2018-09-12 DIAGNOSIS — T1490XA Injury, unspecified, initial encounter: Secondary | ICD-10-CM

## 2018-09-12 DIAGNOSIS — D62 Acute posthemorrhagic anemia: Secondary | ICD-10-CM

## 2018-09-12 DIAGNOSIS — I2510 Atherosclerotic heart disease of native coronary artery without angina pectoris: Secondary | ICD-10-CM

## 2018-09-12 DIAGNOSIS — R339 Retention of urine, unspecified: Secondary | ICD-10-CM

## 2018-09-12 DIAGNOSIS — S3210XA Unspecified fracture of sacrum, initial encounter for closed fracture: Secondary | ICD-10-CM

## 2018-09-12 DIAGNOSIS — I480 Paroxysmal atrial fibrillation: Secondary | ICD-10-CM

## 2018-09-12 DIAGNOSIS — Z89611 Acquired absence of right leg above knee: Secondary | ICD-10-CM

## 2018-09-12 DIAGNOSIS — S2241XA Multiple fractures of ribs, right side, initial encounter for closed fracture: Secondary | ICD-10-CM

## 2018-09-12 DIAGNOSIS — D72829 Elevated white blood cell count, unspecified: Secondary | ICD-10-CM

## 2018-09-12 LAB — BASIC METABOLIC PANEL
Anion gap: 10 (ref 5–15)
BUN: 19 mg/dL (ref 8–23)
CO2: 23 mmol/L (ref 22–32)
CREATININE: 0.92 mg/dL (ref 0.44–1.00)
Calcium: 8.3 mg/dL — ABNORMAL LOW (ref 8.9–10.3)
Chloride: 102 mmol/L (ref 98–111)
GFR calc non Af Amer: >60 mL/min (ref >60)
Glucose, Bld: 107 mg/dL — ABNORMAL HIGH (ref 70–99)
Potassium: 5 mmol/L (ref 3.5–5.1)
Sodium: 135 mmol/L (ref 135–145)

## 2018-09-12 LAB — CBC
HEMATOCRIT: 30 % — AB (ref 36.0–46.0)
HEMOGLOBIN: 8.3 g/dL — AB (ref 12.0–15.0)
MCH: 19.3 pg — AB (ref 26.0–34.0)
MCHC: 27.7 g/dL — AB (ref 30.0–36.0)
MCV: 69.9 fL — AB (ref 78.0–100.0)
Platelets: 361 10*3/uL (ref 150–400)
RBC: 4.29 MIL/uL (ref 3.87–5.11)
RDW: 20.2 % — ABNORMAL HIGH (ref 11.5–15.5)
WBC: 23.3 10*3/uL — ABNORMAL HIGH (ref 4.0–10.5)

## 2018-09-12 LAB — TYPE AND SCREEN
ABO/RH(D): A POS
Antibody Screen: NEGATIVE

## 2018-09-12 MED ORDER — ENOXAPARIN SODIUM 40 MG/0.4ML ~~LOC~~ SOLN
40.0000 mg | SUBCUTANEOUS | Status: DC
  Filled 2018-09-12 (×2): qty 0.4

## 2018-09-12 MED ORDER — ACETAMINOPHEN 500 MG PO TABS
1000.0000 mg | ORAL_TABLET | Freq: Four times a day (QID) | ORAL | Status: DC
  Administered 2018-09-12 – 2018-09-16 (×14): 1000 mg via ORAL
  Filled 2018-09-12 (×14): qty 2

## 2018-09-12 MED ORDER — TRAMADOL HCL 50 MG PO TABS
50.0000 mg | ORAL_TABLET | Freq: Four times a day (QID) | ORAL | Status: DC
  Administered 2018-09-12 – 2018-09-13 (×5): 100 mg via ORAL
  Administered 2018-09-14: 50 mg via ORAL
  Administered 2018-09-14 – 2018-09-16 (×8): 100 mg via ORAL
  Filled 2018-09-12 (×14): qty 2
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
## 2018-09-12 NOTE — Care Management Important Message (Signed)
Important Message  Patient Details  Name: [name] MRN: [id number] Date of Birth: March 06, 1951   Medicare Important Message Given:  Yes    [name] 09/12/2018, 2:45 PM
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
## 2018-09-12 NOTE — Progress Notes (Signed)
Inpatient Rehabilitation-Admissions Coordinator   [facility] attempted to follow up with pt regarding PM&R consult. Pt very lethargic during attempts at conversation, falling asleep while conversing. Pt did say AC could discuss CIR with her daughter- however, AC unable to reach her duaghter via phone. AC will continue attempts to discuss CIR with pt when she is more rested.   Please call for questions.   Jhonnie Garner, OTR/L  Rehab Admissions Coordinator  [phone] 09/12/2018 2:34 PM
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
## 2018-09-13 LAB — CBC
HEMATOCRIT: 32.6 % — AB (ref 36.0–46.0)
Hemoglobin: 9 g/dL — ABNORMAL LOW (ref 12.0–15.0)
MCH: 19.4 pg — ABNORMAL LOW (ref 26.0–34.0)
MCHC: 27.6 g/dL — AB (ref 30.0–36.0)
MCV: 70.1 fL — AB (ref 78.0–100.0)
Platelets: 348 10*3/uL (ref 150–400)
RBC: 4.65 MIL/uL (ref 3.87–5.11)
RDW: 20.4 % — AB (ref 11.5–15.5)
WBC: 22.6 10*3/uL — ABNORMAL HIGH (ref 4.0–10.5)

## 2018-09-13 MED ORDER — BETHANECHOL CHLORIDE 25 MG PO TABS
25.0000 mg | ORAL_TABLET | Freq: Three times a day (TID) | ORAL | Status: DC
  Administered 2018-09-13 – 2018-09-16 (×11): 25 mg via ORAL
  Filled 2018-09-13 (×11): qty 1

## 2018-09-13 MED ORDER — POLYETHYLENE GLYCOL 3350 17 G PO PACK
17.0000 g | PACK | Freq: Two times a day (BID) | ORAL | Status: DC
  Administered 2018-09-13 – 2018-09-16 (×6): 17 g via ORAL
  Filled 2018-09-13 (×6): qty 1

## 2018-09-13 MED ORDER — ENOXAPARIN SODIUM 40 MG/0.4ML ~~LOC~~ SOLN
40.0000 mg | SUBCUTANEOUS | Status: DC
  Administered 2018-09-13 – 2018-09-14 (×2): 40 mg via SUBCUTANEOUS
  Filled 2018-09-13 (×2): qty 0.4

## 2018-09-13 MED ORDER — BISACODYL 10 MG RE SUPP
10.0000 mg | Freq: Once | RECTAL | Status: AC
  Administered 2018-09-13: 10 mg via RECTAL
  Filled 2018-09-13: qty 1

## 2018-09-13 MED FILL — Fentanyl Citrate Preservative Free (PF) Inj 100 MCG/2ML: INTRAMUSCULAR | Qty: 2 | Status: AC
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
## 2018-09-13 NOTE — Progress Notes (Signed)
0130 pt had increased need to urinate but she was unable. Pt was in mild discomfort. Pt was bladder scanned and had 430 ml in bladder. MD made aware and gave orders to place foley. Pt felt relief once foley was placed. Will continue to monitor

## 2018-09-13 NOTE — Progress Notes (Signed)
Inpatient Rehabilitation-Admissions Coordinator   Followed up with pt regarding CIR program. Pt much more alert and able to engage in conversation regarding dispo plans.   Pt unsure of who will assist her at DC. AC was able to contact pt's daughter (per her request) who states between her husband and herself, pt would have the assistance recommended at DC from [place]. AC will need to clarify living situation as pt lives in [place] and there are regulations regarding habitants. Will follow up tomorrow.   AC continues to follow for activity tolerance and further dispo clarifications.   Jhonnie Garner, OTR/L  Rehab Admissions Coordinator  [phone] 09/13/2018 5:52 PM
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
## 2018-09-13 NOTE — Plan of Care (Signed)

## 2018-09-14 LAB — CBC
HCT: 31 % — ABNORMAL LOW (ref 36.0–46.0)
Hemoglobin: 8.7 g/dL — ABNORMAL LOW (ref 12.0–15.0)
MCH: 19.6 pg — ABNORMAL LOW (ref 26.0–34.0)
MCHC: 28.1 g/dL — AB (ref 30.0–36.0)
MCV: 70 fL — ABNORMAL LOW (ref 78.0–100.0)
PLATELETS: 432 10*3/uL — AB (ref 150–400)
RBC: 4.43 MIL/uL (ref 3.87–5.11)
RDW: 20.9 % — AB (ref 11.5–15.5)
WBC: 25.5 10*3/uL — AB (ref 4.0–10.5)

## 2018-09-14 MED ORDER — TAMSULOSIN HCL 0.4 MG PO CAPS
0.4000 mg | ORAL_CAPSULE | Freq: Every day | ORAL | Status: DC
  Administered 2018-09-14 – 2018-09-15 (×2): 0.4 mg via ORAL
  Filled 2018-09-14 (×2): qty 1

## 2018-09-14 MED ORDER — APIXABAN 5 MG PO TABS
5.0000 mg | ORAL_TABLET | Freq: Two times a day (BID) | ORAL | Status: DC
  Administered 2018-09-14 – 2018-09-16 (×5): 5 mg via ORAL
  Filled 2018-09-14 (×5): qty 1

## 2018-09-14 MED ORDER — CYCLOBENZAPRINE HCL 5 MG PO TABS
7.5000 mg | ORAL_TABLET | Freq: Three times a day (TID) | ORAL | Status: DC
  Administered 2018-09-14 – 2018-09-16 (×7): 7.5 mg via ORAL
  Filled 2018-09-14 (×8): qty 1.5
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
## 2018-09-14 NOTE — Plan of Care (Signed)

## 2018-09-14 NOTE — Discharge Instructions (Signed)
Non weight bearing left leg Wear knee immobilizer at all times Bed to chair transfers only     Information on my medicine - ELIQUIS (apixaban)  Why was Eliquis prescribed for you? Eliquis was prescribed for you to reduce the risk of a blood clot forming that can cause a stroke if you have a medical condition called atrial fibrillation (a type of irregular heartbeat).  What do You need to know about Eliquis ? Take your Eliquis TWICE DAILY - one tablet in the morning and one tablet in the evening with or without food. If you have difficulty swallowing the tablet whole please discuss with your pharmacist how to take the medication safely.  Take Eliquis exactly as prescribed by your doctor and DO NOT stop taking Eliquis without talking to the doctor who prescribed the medication.  Stopping may increase your risk of developing a stroke.  Refill your prescription before you run out.  After discharge, you should have regular check-up appointments with your healthcare provider that is prescribing your Eliquis.  In the future your dose may need to be changed if your kidney function or weight changes by a significant amount or as you get older.  What do you do if you miss a dose? If you miss a dose, take it as soon as you remember on the same day and resume taking twice daily.  Do not take more than one dose of ELIQUIS at the same time to make up a missed dose.  Important Safety Information A possible side effect of Eliquis is bleeding. You should call your healthcare provider right away if you experience any of the following: ? Bleeding from an injury or your nose that does not stop. ? Unusual colored urine (red or dark brown) or unusual colored stools (red or black). ? Unusual bruising for unknown reasons. ? A serious fall or if you hit your head (even if there is no bleeding).  Some medicines may interact with Eliquis and might increase your risk of bleeding or clotting while on  Eliquis. To help avoid this, consult your healthcare provider or pharmacist prior to using any new prescription or non-prescription medications, including herbals, vitamins, non-steroidal anti-inflammatory drugs (NSAIDs) and supplements.  This website has more information on Eliquis (apixaban): http://www.eliquis.com/eliquis/home
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
## 2018-09-15 ENCOUNTER — Inpatient Hospital Stay (HOSPITAL_COMMUNITY): Payer: Medicare Other

## 2018-09-15 LAB — CBC
HEMATOCRIT: 33.7 % — AB (ref 36.0–46.0)
Hemoglobin: 9.4 g/dL — ABNORMAL LOW (ref 12.0–15.0)
MCH: 19.5 pg — AB (ref 26.0–34.0)
MCHC: 27.9 g/dL — ABNORMAL LOW (ref 30.0–36.0)
MCV: 69.8 fL — AB (ref 78.0–100.0)
PLATELETS: 503 10*3/uL — AB (ref 150–400)
RBC: 4.83 MIL/uL (ref 3.87–5.11)
RDW: 21.2 % — ABNORMAL HIGH (ref 11.5–15.5)
WBC: 26.8 10*3/uL — ABNORMAL HIGH (ref 4.0–10.5)
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
## 2018-09-15 MED ORDER — TAMSULOSIN HCL 0.4 MG PO CAPS
0.4000 mg | ORAL_CAPSULE | Freq: Every day | ORAL | Status: DC
  Administered 2018-09-16: 0.4 mg via ORAL
  Filled 2018-09-15: qty 1

## 2018-09-15 NOTE — Social Work (Signed)
CSW confirmed with Larene Beach, admissions liaison, Nanine Means able to take pt under Medicaid benefit due to [place].  Will discuss with pt and pt children. Aware pt likely medically stable for discharge tomorrow.  Alexander Mt, [place] Work [phone]

## 2018-09-15 NOTE — Progress Notes (Signed)
PT Cancellation Note  Patient Details Name: [name] MRN: [id number] DOB: 27-Apr-1951   Cancelled Treatment:    Reason Eval/Treat Not Completed: Patient declined, no reason specified.   Pt declined due to hurting all over and not wanting to move. 09/15/2018  Donnella Sham, [place] [facility] [phone]  (pager) [phone]  (office)   [name] 09/15/2018, 6:01 PM
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
## 2018-09-15 NOTE — Plan of Care (Signed)

## 2018-09-15 NOTE — Progress Notes (Signed)
Inpatient Rehabilitation-Admissions Coordinator   Spoke with pt at the bedside as follow up for dispo planning and tolerance conversation. AC discussed how her daughter and son in law had stated that they plan to assist her at DC. Pt does not want her son in law to have to assist her as she felt he has too much going on. Pt stated she feels she may need a longer rehab course and thinks SNF placement may be what she needs. Pt named [facility] as a facility of interest and one that is closest to her home. AC will communicate with SW and CM regarding her preference for SNF.   AC will sign off.   Please call if questions.   Jhonnie Garner, OTR/L  Rehab Admissions Coordinator  [phone] 09/15/2018 11:00 AM

## 2018-09-16 ENCOUNTER — Inpatient Hospital Stay (HOSPITAL_COMMUNITY): Payer: Medicare Other
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
## 2018-09-16 MED ORDER — TRAMADOL HCL 50 MG PO TABS: 50.0000 mg | ORAL_TABLET | Freq: Four times a day (QID) | ORAL | 0 refills | Status: AC | PRN

## 2018-09-16 MED ORDER — OXYCODONE HCL 5 MG PO TABS: 5.0000 mg | ORAL_TABLET | ORAL | 0 refills | Status: DC | PRN

## 2018-09-16 MED ORDER — ACETAMINOPHEN 500 MG PO TABS: 1000.0000 mg | ORAL_TABLET | Freq: Four times a day (QID) | ORAL | 0 refills | Status: DC

## 2018-09-16 MED ORDER — TAMSULOSIN HCL 0.4 MG PO CAPS: 0.4000 mg | ORAL_CAPSULE | Freq: Every day | ORAL | Status: DC

## 2018-09-16 MED ORDER — CALCIUM CITRATE-VITAMIN D 250-100 MG-UNIT PO TABS: 2.0000 | ORAL_TABLET | Freq: Two times a day (BID) | ORAL | Status: DC

## 2018-09-16 MED ORDER — BETHANECHOL CHLORIDE 25 MG PO TABS: 25.0000 mg | ORAL_TABLET | Freq: Three times a day (TID) | ORAL | Status: DC

## 2018-09-16 MED ORDER — CYCLOBENZAPRINE HCL 7.5 MG PO TABS: 7.5000 mg | ORAL_TABLET | Freq: Three times a day (TID) | ORAL | 0 refills | Status: DC

## 2018-09-16 NOTE — Progress Notes (Signed)
Orthopedic Tech Progress Note Patient Details:  [name] 1951/05/01 [id number]  Ortho Devices Type of Ortho Device: CAM walker Ortho Device/Splint Location: LLE Ortho Device/Splint Interventions: Ordered, Application   Post Interventions Patient Tolerated: Well Instructions Provided: Care of device   Braulio Bosch 09/16/2018, 2:13 PM

## 2018-09-16 NOTE — Progress Notes (Signed)
Patient to keep foley in and trial to void at SNF.

## 2018-09-16 NOTE — Social Work (Addendum)
Clinical Social Worker facilitated patient discharge including contacting patient family and facility to confirm patient discharge plans.  Clinical information faxed to facility and family agreeable with plan.  CSW arranged ambulance transport via [facility] to [facility]- pick up called for 4pm.  RN to call [phone] with report prior to discharge.  Clinical Social Worker will sign off for now as social work intervention is no longer needed. Please consult [place] again if new need arises.  Alexander Mt, [place] Social Worker

## 2018-09-16 NOTE — Clinical Social Work Note (Signed)
Clinical Social Work Assessment  Patient Details  Name: [name] MRN: [id number] Date of Birth: July 05, 1951  Date of referral:  09/16/18               Reason for consult:  [employer], Discharge Planning                Permission sought to share information with:  Facility Sport and exercise psychologist, Family Supports Permission granted to share information::  Yes, Verbal Permission Granted  Name::     [place]::  [employer]  Relationship::  daughter  Contact Information:  [phone]  Housing/Transportation Living arrangements for the past 2 months:    Source of Information:  Patient Patient Interpreter Needed:  None Criminal Activity/Legal Involvement Pertinent to Current Situation/Hospitalization:  No - Comment as needed Significant Relationships:  Adult Children, Siblings Lives with:  Self, Minor Children Do you feel safe going back to the place where you live?  Yes Need for family participation in patient care:  Yes (Comment)  Care giving concerns: Pt lives with her grandchildren who she helps to take care of. Pt currently requiring max assistance for ADLs and IADLs. Pt unable to be admitted to CIR due to lack of consistent support at discharge.   Social Worker assessment / plan:  CSW met with pt at bedside. Pt alert and sitting up in bed- discussed SNF placement, acknowledging pt preference for [facility]. Pt agreed that "if I have to go to nursing home that I prefers [facility]." CSW explained Nature conservation officer and that pt will discharge under her Medicaid benefit for 30 days. Pt states understanding.   Pt takes care of her grandchildren (72 and 22) and they are currently under the care of their mother (pts daughter), and if they need any additional support their father is able to be reached by pt. Pt understanding that discharge may be today and that we would let pt know prior to transport being arranged.   Employment status:  Disabled (Comment on  whether or not currently receiving Disability) Insurance information:  Programmer, applications PT Recommendations:  24 Hour Supervision, [place] / Referral to community resources:  [place]  Patient/Family's Response to care: Pt understands CSW role and recommendations. Pt amenable to discharge and understands that it may be today.  Patient/Family's Understanding of and Emotional Response to Diagnosis, Current Treatment, and Prognosis:  Pt states understanding of diagnosis, current treatment and prognosis. Pt understanding of recommendations and is amenable to SNF. Pt emotionally appropriate, only tearing up when discussing logistics of arranging care for her grandchildren and reflecting on "taking my husband off of life support." Pt eager to rehab and return home to caring for grandchildren.  Emotional Assessment Appearance:  Appears stated age Attitude/Demeanor/Rapport:  Gracious, Engaged Affect (typically observed):  Accepting, Adaptable, Appropriate, Pleasant Orientation:  Oriented to Self, Oriented to Place, Oriented to  Time, Oriented to Situation Alcohol / Substance use:  Not Applicable Psych involvement (Current and /or in the community):     Discharge Needs  Concerns to be addressed:  Care Coordination Readmission within the last 30 days:  No Current discharge risk:  Physical Impairment, Dependent with Mobility Barriers to Discharge:  Continued Medical Work up   [employer], [place] 09/16/2018, 11:55 AM

## 2018-09-16 NOTE — Progress Notes (Signed)
Spoke with [name] and patient is to be discharged to SNF today or tomorrow.

## 2018-09-16 NOTE — Progress Notes (Signed)
Nurse called to give report. Spoke with RN, Arbie Cookey.
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
## 2018-09-16 NOTE — Clinical Social Work Placement (Signed)
   CLINICAL SOCIAL WORK PLACEMENT  NOTE Nanine Means RN to call report to [phone]  Date:  09/16/2018  Patient Details  Name: [name] MRN: [id number] Date of Birth: March 31, 1951  Clinical Social Work is seeking post-discharge placement for this patient at the [place] level of care (*CSW will initial, date and re-position this form in  chart as items are completed):  Yes   Patient/family provided with [facility] Work Department's list of facilities offering this level of care within the geographic area requested by the patient (or if unable, by the patient's family).  Yes   Patient/family informed of their freedom to choose among providers that offer the needed level of care, that participate in Medicare, Medicaid or managed care program needed by the patient, have an available bed and are willing to accept the patient.  Yes   Patient/family informed of [facility]'s ownership interest in [facility] and [facility], as well as of the fact that they are under no obligation to receive care at these facilities.  PASRR submitted to EDS on       PASRR number received on       Existing PASRR number confirmed on 09/14/18     FL2 transmitted to all facilities in geographic area requested by pt/family on 09/14/18     FL2 transmitted to all facilities within larger geographic area on       Patient informed that his/her managed care company has contracts with or will negotiate with certain facilities, including the following:        Yes   Patient/family informed of bed offers received.  Patient chooses bed at [facility]     Physician recommends and patient chooses bed at      Patient to be transferred to [facility] on  .  Patient to be transferred to facility by [facility]     Patient family notified on 09/16/18 of transfer.  Name of family member notified:  pt responsible for self     PHYSICIAN Please prepare priority discharge  summary, including medications, Please prepare prescriptions     Additional Comment:    _______________________________________________ Alexander Mt, LCSWA 09/16/2018, 12:04 PM
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
## 2018-09-16 NOTE — Progress Notes (Signed)
Patient off the unit for CT of left leg.

## 2018-09-22 ENCOUNTER — Other Ambulatory Visit: Payer: Self-pay | Admitting: Oncology

## 2018-09-22 DIAGNOSIS — M159 Polyosteoarthritis, unspecified: Secondary | ICD-10-CM | POA: Insufficient documentation

## 2018-09-22 DIAGNOSIS — D45 Polycythemia vera: Secondary | ICD-10-CM

## 2018-09-22 DIAGNOSIS — M150 Primary generalized (osteo)arthritis: Secondary | ICD-10-CM | POA: Insufficient documentation

## 2018-10-03 DIAGNOSIS — M25562 Pain in left knee: Secondary | ICD-10-CM

## 2018-10-03 DIAGNOSIS — M25572 Pain in left ankle and joints of left foot: Secondary | ICD-10-CM | POA: Insufficient documentation

## 2018-10-03 HISTORY — DX: Pain in left knee: M25.562

## 2018-10-11 ENCOUNTER — Encounter (HOSPITAL_BASED_OUTPATIENT_CLINIC_OR_DEPARTMENT_OTHER): Payer: Medicare Other | Attending: Internal Medicine

## 2018-10-11 DIAGNOSIS — Z8673 Personal history of transient ischemic attack (TIA), and cerebral infarction without residual deficits: Secondary | ICD-10-CM | POA: Insufficient documentation

## 2018-10-11 DIAGNOSIS — S61411A Laceration without foreign body of right hand, initial encounter: Secondary | ICD-10-CM | POA: Diagnosis not present

## 2018-10-11 DIAGNOSIS — Z87891 Personal history of nicotine dependence: Secondary | ICD-10-CM | POA: Diagnosis not present

## 2018-10-11 DIAGNOSIS — Z89611 Acquired absence of right leg above knee: Secondary | ICD-10-CM | POA: Diagnosis not present

## 2018-10-11 DIAGNOSIS — I10 Essential (primary) hypertension: Secondary | ICD-10-CM | POA: Diagnosis not present

## 2018-10-11 DIAGNOSIS — S0101XA Laceration without foreign body of scalp, initial encounter: Secondary | ICD-10-CM | POA: Insufficient documentation

## 2018-10-11 DIAGNOSIS — Z7901 Long term (current) use of anticoagulants: Secondary | ICD-10-CM | POA: Diagnosis not present

## 2018-10-11 DIAGNOSIS — S61421A Laceration with foreign body of right hand, initial encounter: Secondary | ICD-10-CM | POA: Diagnosis not present

## 2018-10-11 DIAGNOSIS — L97228 Non-pressure chronic ulcer of left calf with other specified severity: Secondary | ICD-10-CM | POA: Insufficient documentation

## 2018-10-23 DIAGNOSIS — S82899A Other fracture of unspecified lower leg, initial encounter for closed fracture: Secondary | ICD-10-CM

## 2018-10-23 DIAGNOSIS — S82142A Displaced bicondylar fracture of left tibia, initial encounter for closed fracture: Secondary | ICD-10-CM | POA: Insufficient documentation

## 2018-10-23 HISTORY — DX: Other fracture of unspecified lower leg, initial encounter for closed fracture: S82.899A

## 2018-10-23 HISTORY — DX: Displaced bicondylar fracture of left tibia, initial encounter for closed fracture: S82.142A

## 2018-10-25 ENCOUNTER — Encounter (HOSPITAL_BASED_OUTPATIENT_CLINIC_OR_DEPARTMENT_OTHER): Payer: Medicare Other | Attending: Internal Medicine

## 2018-10-25 DIAGNOSIS — L98492 Non-pressure chronic ulcer of skin of other sites with fat layer exposed: Secondary | ICD-10-CM | POA: Diagnosis not present

## 2018-10-25 DIAGNOSIS — L97228 Non-pressure chronic ulcer of left calf with other specified severity: Secondary | ICD-10-CM | POA: Diagnosis present

## 2018-10-26 ENCOUNTER — Telehealth: Payer: Self-pay | Admitting: Cardiovascular Disease

## 2018-10-26 DIAGNOSIS — I739 Peripheral vascular disease, unspecified: Secondary | ICD-10-CM

## 2018-10-26 NOTE — Telephone Encounter (Signed)
New Message:       Pt is returning a call to schedule a test but I saw no up to date test as well as who she said ordered the test.
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
## 2018-10-27 NOTE — Telephone Encounter (Signed)
Message also routed to covering MD for approval to order LEA. Waiting to hear back

## 2018-10-27 NOTE — Telephone Encounter (Signed)
Okay to schedule for lower extremity arterial Doppler study

## 2018-10-28 NOTE — Telephone Encounter (Signed)
Patient aware. Order placed and will have scheduler call to arrange.

## 2018-11-02 ENCOUNTER — Other Ambulatory Visit: Payer: Self-pay | Admitting: Cardiovascular Disease

## 2018-11-02 ENCOUNTER — Ambulatory Visit (HOSPITAL_COMMUNITY)
Admission: RE | Admit: 2018-11-02 | Discharge: 2018-11-02 | Disposition: A | Discharge status: Home / Self Care | Payer: Medicare Other | Source: Ambulatory Visit | Attending: Cardiology | Admitting: Cardiology

## 2018-11-02 ENCOUNTER — Ambulatory Visit (HOSPITAL_BASED_OUTPATIENT_CLINIC_OR_DEPARTMENT_OTHER)
Admission: RE | Admit: 2018-11-02 | Discharge: 2018-11-02 | Disposition: A | Discharge status: Home / Self Care | Payer: Medicare Other | Source: Ambulatory Visit | Attending: Cardiology | Admitting: Cardiology

## 2018-11-02 DIAGNOSIS — Z95820 Peripheral vascular angioplasty status with implants and grafts: Secondary | ICD-10-CM

## 2018-11-02 DIAGNOSIS — I739 Peripheral vascular disease, unspecified: Secondary | ICD-10-CM

## 2018-11-08 ENCOUNTER — Other Ambulatory Visit: Payer: Self-pay | Admitting: *Deleted

## 2018-11-08 DIAGNOSIS — I739 Peripheral vascular disease, unspecified: Secondary | ICD-10-CM

## 2018-11-08 DIAGNOSIS — L97228 Non-pressure chronic ulcer of left calf with other specified severity: Secondary | ICD-10-CM | POA: Diagnosis not present

## 2018-11-21 ENCOUNTER — Encounter (HOSPITAL_BASED_OUTPATIENT_CLINIC_OR_DEPARTMENT_OTHER): Payer: Medicare Other | Attending: Internal Medicine

## 2018-11-21 DIAGNOSIS — I70248 Atherosclerosis of native arteries of left leg with ulceration of other part of lower left leg: Secondary | ICD-10-CM | POA: Diagnosis present

## 2018-11-21 DIAGNOSIS — L97822 Non-pressure chronic ulcer of other part of left lower leg with fat layer exposed: Secondary | ICD-10-CM | POA: Diagnosis not present

## 2018-11-29 ENCOUNTER — Encounter: Payer: Self-pay | Admitting: Cardiovascular Disease

## 2018-11-29 ENCOUNTER — Ambulatory Visit: Payer: Medicare Other | Admitting: Cardiovascular Disease

## 2018-11-29 ENCOUNTER — Ambulatory Visit (INDEPENDENT_AMBULATORY_CARE_PROVIDER_SITE_OTHER): Payer: Medicare Other | Admitting: Cardiovascular Disease

## 2018-11-29 DIAGNOSIS — E7800 Pure hypercholesterolemia, unspecified: Secondary | ICD-10-CM

## 2018-11-29 DIAGNOSIS — I480 Paroxysmal atrial fibrillation: Secondary | ICD-10-CM

## 2018-11-29 DIAGNOSIS — I739 Peripheral vascular disease, unspecified: Secondary | ICD-10-CM | POA: Diagnosis not present

## 2018-11-29 DIAGNOSIS — I2510 Atherosclerotic heart disease of native coronary artery without angina pectoris: Secondary | ICD-10-CM | POA: Diagnosis not present

## 2018-11-29 DIAGNOSIS — I6529 Occlusion and stenosis of unspecified carotid artery: Secondary | ICD-10-CM

## 2018-11-29 DIAGNOSIS — I10 Essential (primary) hypertension: Secondary | ICD-10-CM
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
## 2018-11-29 NOTE — Assessment & Plan Note (Signed)
History of PAF maintaining sinus rhythm on flecainide and Eliquis

## 2018-11-29 NOTE — Assessment & Plan Note (Signed)
History of essential hypertension her blood pressure measured today 134/76.  She is on lisinopril.  Continue current meds at current dosing.

## 2018-11-29 NOTE — Assessment & Plan Note (Signed)
>>  ASSESSMENT AND PLAN FOR MIXED HYPERLIPIDEMIA WRITTEN ON 11/29/2018 12:28 PM BY BERRY, JONATHAN J, MD  History of hyperlipidemia on fenofibrate  with lipid profile performed 03/04/2017 revealing total cholesterol 151, LDL 48 and HDL 43.

## 2018-11-29 NOTE — Assessment & Plan Note (Signed)
History of carotid artery disease with recent Dopplers from 08/02/2018 revealing mild right and moderate left ICA stenosis.
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
## 2018-11-29 NOTE — Assessment & Plan Note (Signed)
History of noncritical CAD by cardiac catheterization performed 04/27/2013.  She denies chest pain or shortness of breath.

## 2018-11-29 NOTE — Patient Instructions (Signed)
Medication Instructions:  Your physician recommends that you continue on your current medications as directed. Please refer to the Current Medication list given to you today. If you need a refill on your cardiac medications before your next appointment, please call your pharmacy.   Lab work: NONE If you have labs (blood work) drawn today and your tests are completely normal, you will receive your results only by: Marland Kitchen MyChart Message (if you have MyChart) OR . A paper copy in the mail If you have any lab test that is abnormal or we need to change your treatment, we will call you to review the results.  Testing/Procedures: NONE  Follow-Up: At [facility], you and your health needs are our priority.  As part of our continuing mission to provide you with exceptional heart care, we have created designated Provider Care Teams.  These Care Teams include your primary Cardiologist (physician) and Advanced Practice Providers (APPs -  Physician Assistants and Nurse Practitioners) who all work together to provide you with the care you need, when you need it.  You will need a follow up appointment in 6 weeks WITH DR. [name].

## 2018-11-29 NOTE — Assessment & Plan Note (Signed)
History of hyperlipidemia on fenofibrate with lipid profile performed 03/04/2017 revealing total cholesterol 151, LDL 48 and HDL 43.

## 2018-12-05 DIAGNOSIS — I70248 Atherosclerosis of native arteries of left leg with ulceration of other part of lower left leg: Secondary | ICD-10-CM | POA: Diagnosis not present

## 2018-12-19 DIAGNOSIS — I70248 Atherosclerosis of native arteries of left leg with ulceration of other part of lower left leg: Secondary | ICD-10-CM | POA: Diagnosis not present

## 2018-12-28 DIAGNOSIS — M792 Neuralgia and neuritis, unspecified: Secondary | ICD-10-CM | POA: Insufficient documentation

## 2018-12-28 DIAGNOSIS — L97909 Non-pressure chronic ulcer of unspecified part of unspecified lower leg with unspecified severity: Secondary | ICD-10-CM | POA: Insufficient documentation

## 2019-01-02 ENCOUNTER — Encounter (HOSPITAL_BASED_OUTPATIENT_CLINIC_OR_DEPARTMENT_OTHER): Payer: Medicare Other | Attending: Internal Medicine

## 2019-01-02 DIAGNOSIS — I70242 Atherosclerosis of native arteries of left leg with ulceration of calf: Secondary | ICD-10-CM | POA: Insufficient documentation

## 2019-01-02 DIAGNOSIS — H409 Unspecified glaucoma: Secondary | ICD-10-CM | POA: Diagnosis not present

## 2019-01-02 DIAGNOSIS — L97222 Non-pressure chronic ulcer of left calf with fat layer exposed: Secondary | ICD-10-CM | POA: Insufficient documentation

## 2019-01-09 DIAGNOSIS — I70242 Atherosclerosis of native arteries of left leg with ulceration of calf: Secondary | ICD-10-CM | POA: Diagnosis not present

## 2019-01-10 ENCOUNTER — Ambulatory Visit (INDEPENDENT_AMBULATORY_CARE_PROVIDER_SITE_OTHER): Payer: Medicare Other | Admitting: Cardiovascular Disease

## 2019-01-10 ENCOUNTER — Encounter: Payer: Self-pay | Admitting: Cardiovascular Disease

## 2019-01-10 DIAGNOSIS — I739 Peripheral vascular disease, unspecified: Secondary | ICD-10-CM
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
## 2019-01-10 NOTE — Patient Instructions (Signed)
Medication Instructions:  NONE If you need a refill on your cardiac medications before your next appointment, please call your pharmacy.   Lab work: NONE If you have labs (blood work) drawn today and your tests are completely normal, you will receive your results only by: Marland Kitchen MyChart Message (if you have MyChart) OR . A paper copy in the mail If you have any lab test that is abnormal or we need to change your treatment, we will call you to review the results.  Testing/Procedures: Your physician has requested that you have a carotid duplex. This test is an ultrasound of the carotid arteries in your neck. It looks at blood flow through these arteries that supply the brain with blood. Allow one hour for this exam. There are no restrictions or special instructions.  SCHEDULE AUGUST 2020  Follow-Up: At [facility], you and your health needs are our priority.  As part of our continuing mission to provide you with exceptional heart care, we have created designated Provider Care Teams.  These Care Teams include your primary Cardiologist (physician) and Advanced Practice Providers (APPs -  Physician Assistants and Nurse Practitioners) who all work together to provide you with the care you need, when you need it. . You will need a follow up appointment in 3 months.  Please call our office 2 months in advance to schedule this appointment.  You may see Dr. [name] or one of the following Advanced Practice Providers on your designated Care Team:   . Kerin Ransom, [place] . Almyra Deforest, PA-C . Fabian Sharp, PA-C . Jory Sims, DNP . Rosaria Ferries, PA-C . Roby Lofts, PA-C . Sande Rives, PA-C
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
## 2019-01-13 DIAGNOSIS — Z449 Encounter for fitting and adjustment of unspecified external prosthetic device: Secondary | ICD-10-CM

## 2019-01-13 HISTORY — DX: Encounter for fitting and adjustment of unspecified external prosthetic device: Z44.9

## 2019-01-16 DIAGNOSIS — I70242 Atherosclerosis of native arteries of left leg with ulceration of calf: Secondary | ICD-10-CM | POA: Diagnosis not present

## 2019-01-23 ENCOUNTER — Encounter (HOSPITAL_BASED_OUTPATIENT_CLINIC_OR_DEPARTMENT_OTHER): Payer: Medicare Other | Attending: Internal Medicine

## 2019-01-23 DIAGNOSIS — I70242 Atherosclerosis of native arteries of left leg with ulceration of calf: Secondary | ICD-10-CM | POA: Insufficient documentation

## 2019-01-23 DIAGNOSIS — L97222 Non-pressure chronic ulcer of left calf with fat layer exposed: Secondary | ICD-10-CM | POA: Diagnosis not present

## 2019-01-30 DIAGNOSIS — I70242 Atherosclerosis of native arteries of left leg with ulceration of calf: Secondary | ICD-10-CM | POA: Diagnosis not present

## 2019-02-06 DIAGNOSIS — I70242 Atherosclerosis of native arteries of left leg with ulceration of calf: Secondary | ICD-10-CM | POA: Diagnosis not present

## 2019-02-14 DIAGNOSIS — I70242 Atherosclerosis of native arteries of left leg with ulceration of calf: Secondary | ICD-10-CM | POA: Diagnosis not present

## 2019-02-15 ENCOUNTER — Encounter: Payer: Self-pay | Admitting: Hematology

## 2019-02-15 ENCOUNTER — Telehealth: Payer: Self-pay | Admitting: Hematology

## 2019-02-15 ENCOUNTER — Other Ambulatory Visit: Payer: Self-pay | Admitting: Cardiology

## 2019-02-15 DIAGNOSIS — I739 Peripheral vascular disease, unspecified: Secondary | ICD-10-CM

## 2019-02-15 NOTE — Telephone Encounter (Signed)
New referral received from Dr. Beryle Beams for the pt to see Dr. Burr Medico w/dx of polcythemia vera. Pt has been scheduled to see Dr. Burr Medico on 3/13 at 1145am. Pt aware to arrive 30 minutes early and will have labs at 11:15am. Letter mailed.

## 2019-02-20 ENCOUNTER — Encounter: Payer: Self-pay | Admitting: *Deleted

## 2019-02-21 ENCOUNTER — Encounter (HOSPITAL_BASED_OUTPATIENT_CLINIC_OR_DEPARTMENT_OTHER): Payer: Medicare Other | Attending: Internal Medicine

## 2019-02-21 DIAGNOSIS — I70242 Atherosclerosis of native arteries of left leg with ulceration of calf: Secondary | ICD-10-CM | POA: Diagnosis not present

## 2019-02-21 DIAGNOSIS — L97222 Non-pressure chronic ulcer of left calf with fat layer exposed: Secondary | ICD-10-CM | POA: Diagnosis not present

## 2019-02-22 IMAGING — CR DG KNEE 1-2V*L*
1 series · 2 of 2 positions shown · non-contrast
Comparison: [DATE]

CLINICAL DATA: Left knee pain and bruising.

EXAM:
LEFT KNEE - 1-2 VIEW

[Series 1: ap · 0.17mm/px · 2 of 2 slices shown]
[im 1/2]
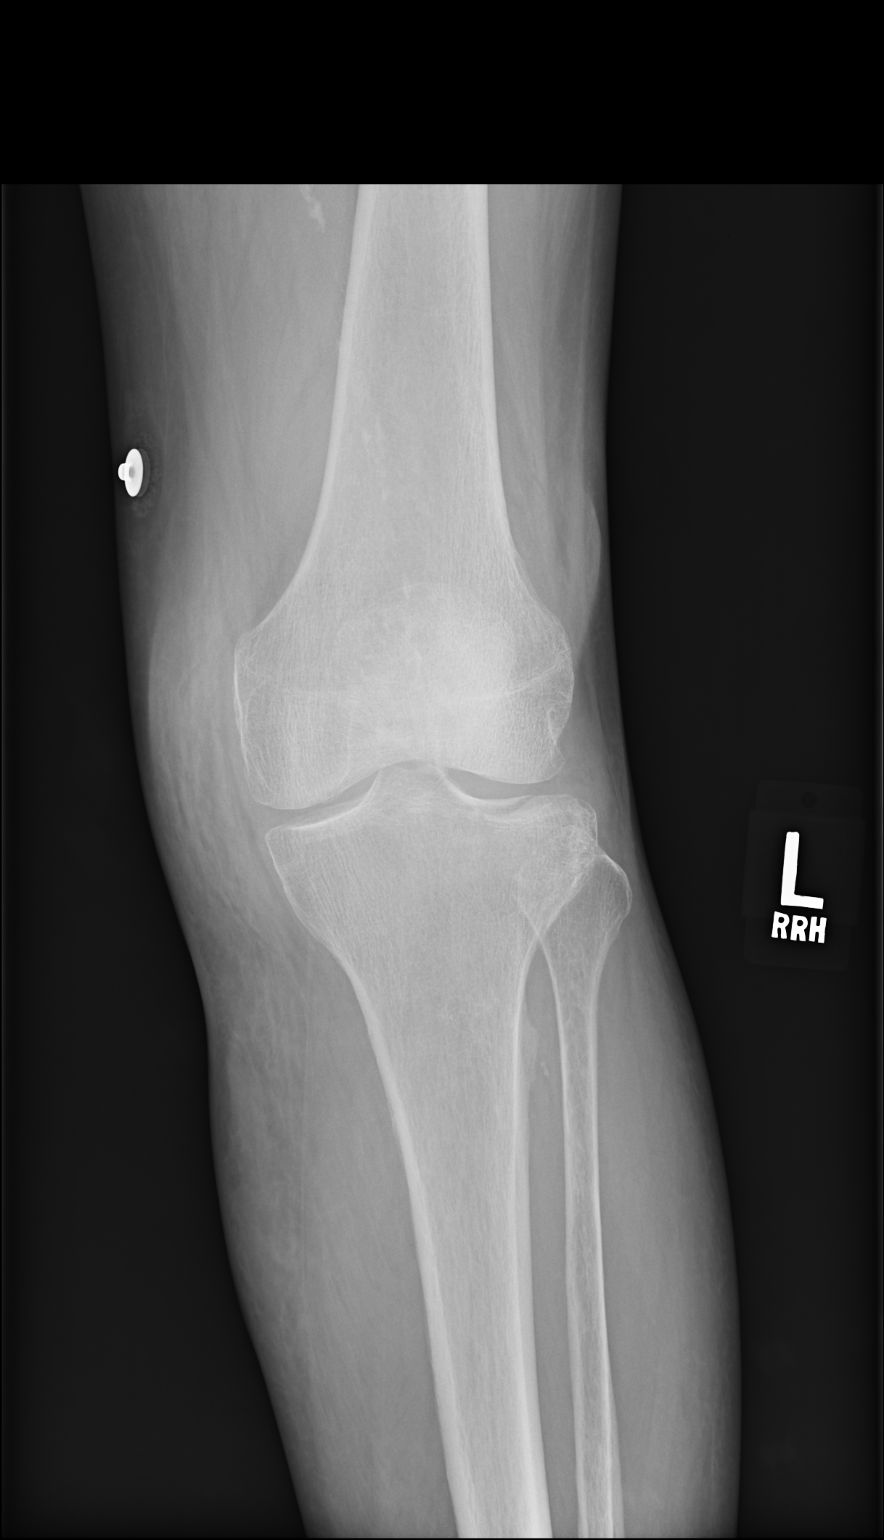
[im 2/2]
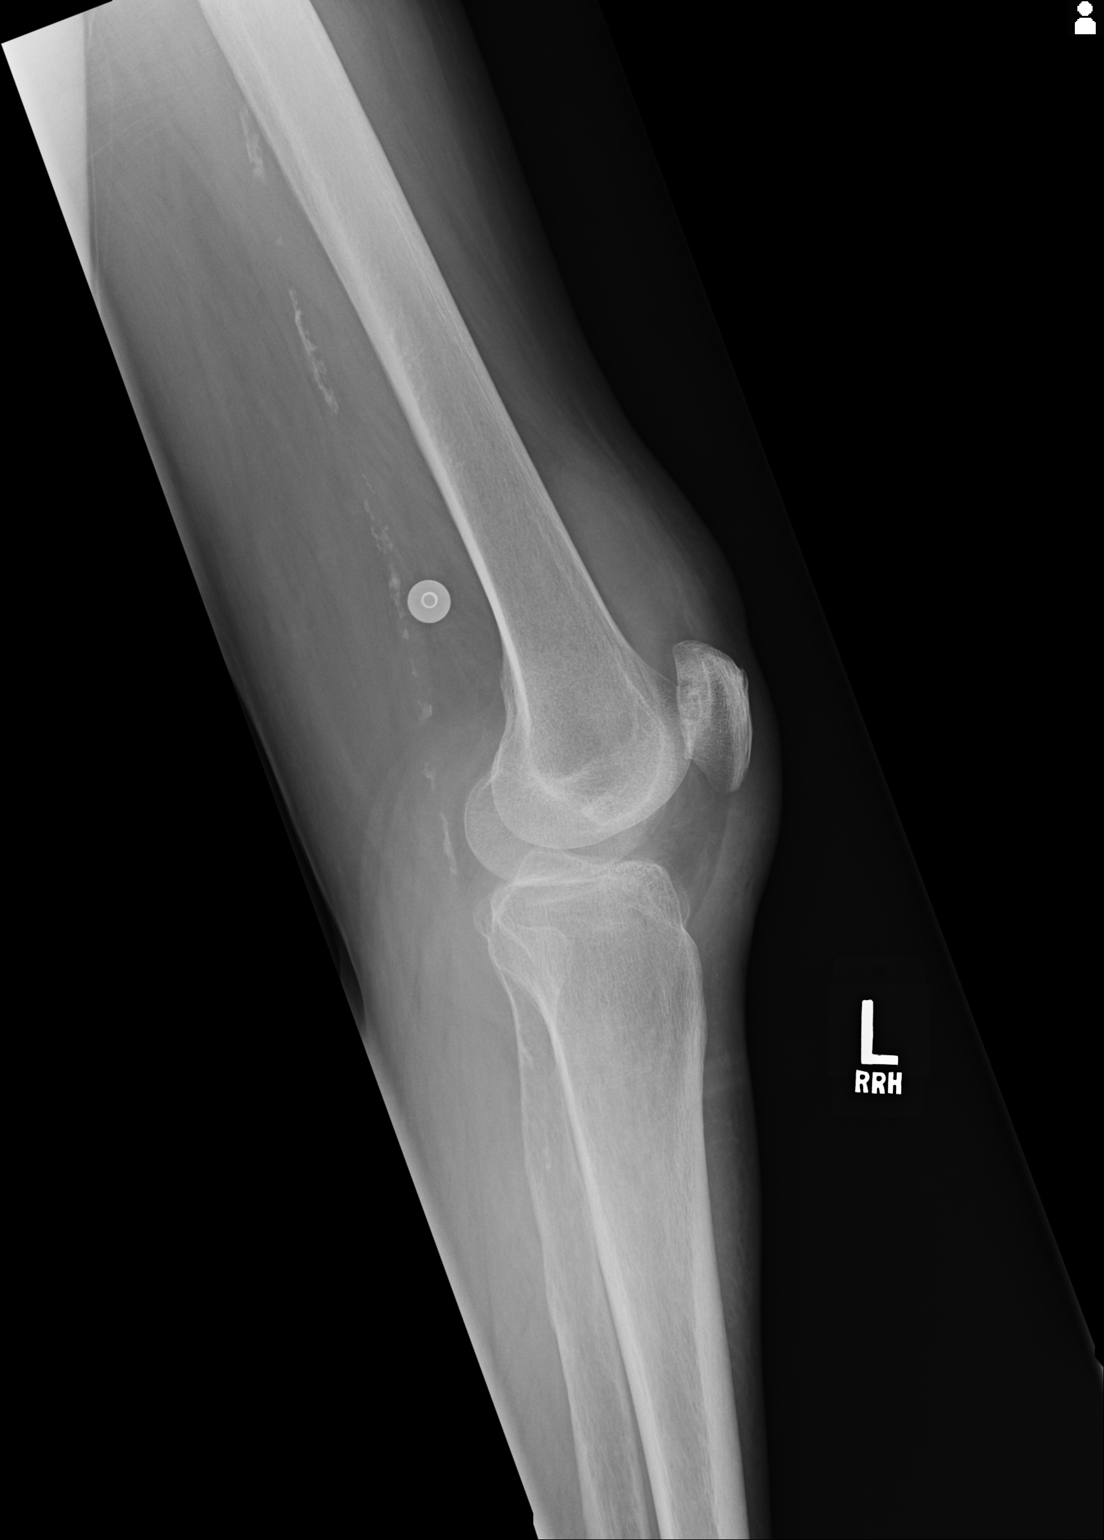

[2 of 2 positions shown; findings below may reference images not displayed]

FINDINGS: There is no fracture or dislocation. Prominent suprapatellar
effusion. Soft tissue swelling around the knee. Slight arthritic
changes of the patella, stable.
IMPRESSION: No acute bone abnormality.

Prominent joint effusion.

Arthritic changes of the patella, stable.
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
## 2019-02-27 DIAGNOSIS — I70242 Atherosclerosis of native arteries of left leg with ulceration of calf: Secondary | ICD-10-CM | POA: Diagnosis not present
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
## 2019-03-02 ENCOUNTER — Other Ambulatory Visit: Payer: Self-pay | Admitting: Hematology

## 2019-03-02 DIAGNOSIS — D45 Polycythemia vera: Secondary | ICD-10-CM

## 2019-03-03 ENCOUNTER — Encounter: Payer: Self-pay | Admitting: Hematology

## 2019-03-03 ENCOUNTER — Inpatient Hospital Stay: Payer: Medicare Other | Attending: Hematology | Admitting: Hematology

## 2019-03-03 ENCOUNTER — Telehealth: Payer: Self-pay | Admitting: Hematology

## 2019-03-03 ENCOUNTER — Inpatient Hospital Stay: Payer: Medicare Other

## 2019-03-03 ENCOUNTER — Other Ambulatory Visit: Payer: Self-pay

## 2019-03-03 VITALS — BP 172/76 | HR 80

## 2019-03-03 VITALS — BP 179/75 | HR 78 | Temp 98.3°F | Resp 20 | Ht 65.5 in | Wt 119.0 lb

## 2019-03-03 DIAGNOSIS — K219 Gastro-esophageal reflux disease without esophagitis: Secondary | ICD-10-CM | POA: Diagnosis not present

## 2019-03-03 DIAGNOSIS — Z79899 Other long term (current) drug therapy: Secondary | ICD-10-CM | POA: Diagnosis not present

## 2019-03-03 DIAGNOSIS — R7989 Other specified abnormal findings of blood chemistry: Secondary | ICD-10-CM | POA: Diagnosis not present

## 2019-03-03 DIAGNOSIS — D45 Polycythemia vera: Secondary | ICD-10-CM | POA: Diagnosis not present

## 2019-03-03 DIAGNOSIS — I252 Old myocardial infarction: Secondary | ICD-10-CM | POA: Diagnosis not present

## 2019-03-03 DIAGNOSIS — Z791 Long term (current) use of non-steroidal anti-inflammatories (NSAID): Secondary | ICD-10-CM | POA: Insufficient documentation

## 2019-03-03 DIAGNOSIS — Z801 Family history of malignant neoplasm of trachea, bronchus and lung: Secondary | ICD-10-CM | POA: Diagnosis not present

## 2019-03-03 DIAGNOSIS — E785 Hyperlipidemia, unspecified: Secondary | ICD-10-CM | POA: Insufficient documentation

## 2019-03-03 DIAGNOSIS — K760 Fatty (change of) liver, not elsewhere classified: Secondary | ICD-10-CM | POA: Diagnosis not present

## 2019-03-03 DIAGNOSIS — Z7901 Long term (current) use of anticoagulants: Secondary | ICD-10-CM

## 2019-03-03 DIAGNOSIS — Z809 Family history of malignant neoplasm, unspecified: Secondary | ICD-10-CM | POA: Diagnosis not present

## 2019-03-03 DIAGNOSIS — Z8673 Personal history of transient ischemic attack (TIA), and cerebral infarction without residual deficits: Secondary | ICD-10-CM | POA: Diagnosis not present

## 2019-03-03 DIAGNOSIS — H409 Unspecified glaucoma: Secondary | ICD-10-CM | POA: Diagnosis not present

## 2019-03-03 DIAGNOSIS — M1990 Unspecified osteoarthritis, unspecified site: Secondary | ICD-10-CM | POA: Insufficient documentation

## 2019-03-03 DIAGNOSIS — Z87891 Personal history of nicotine dependence: Secondary | ICD-10-CM | POA: Diagnosis not present

## 2019-03-03 DIAGNOSIS — Z89611 Acquired absence of right leg above knee: Secondary | ICD-10-CM | POA: Diagnosis not present

## 2019-03-03 DIAGNOSIS — I739 Peripheral vascular disease, unspecified: Secondary | ICD-10-CM | POA: Diagnosis not present

## 2019-03-03 DIAGNOSIS — R1011 Right upper quadrant pain: Secondary | ICD-10-CM | POA: Insufficient documentation

## 2019-03-03 DIAGNOSIS — I10 Essential (primary) hypertension: Secondary | ICD-10-CM | POA: Diagnosis not present

## 2019-03-03 DIAGNOSIS — D509 Iron deficiency anemia, unspecified: Secondary | ICD-10-CM | POA: Diagnosis not present

## 2019-03-03 DIAGNOSIS — Z7982 Long term (current) use of aspirin: Secondary | ICD-10-CM | POA: Diagnosis not present

## 2019-03-03 DIAGNOSIS — Z86718 Personal history of other venous thrombosis and embolism: Secondary | ICD-10-CM | POA: Diagnosis not present

## 2019-03-03 DIAGNOSIS — I480 Paroxysmal atrial fibrillation: Secondary | ICD-10-CM | POA: Diagnosis not present

## 2019-03-03 DIAGNOSIS — Z8249 Family history of ischemic heart disease and other diseases of the circulatory system: Secondary | ICD-10-CM | POA: Diagnosis not present

## 2019-03-03 LAB — CBC WITH DIFFERENTIAL (CANCER CENTER ONLY)
Abs Immature Granulocytes: 0.93 10*3/uL — ABNORMAL HIGH (ref 0.00–0.07)
BASOS ABS: 0.3 10*3/uL — AB (ref 0.0–0.1)
Basophils Relative: 1 %
EOS ABS: 0.7 10*3/uL — AB (ref 0.0–0.5)
EOS PCT: 3 %
HCT: 51.8 % — ABNORMAL HIGH (ref 36.0–46.0)
Hemoglobin: 14.5 g/dL (ref 12.0–15.0)
IMMATURE GRANULOCYTES: 4 %
LYMPHS ABS: 1.4 10*3/uL (ref 0.7–4.0)
LYMPHS PCT: 6 %
MCH: 20.5 pg — AB (ref 26.0–34.0)
MCHC: 28 g/dL — ABNORMAL LOW (ref 30.0–36.0)
MCV: 73.3 fL — AB (ref 80.0–100.0)
Monocytes Absolute: 0.4 10*3/uL (ref 0.1–1.0)
Monocytes Relative: 2 %
NEUTROS PCT: 84 %
Neutro Abs: 19.1 10*3/uL — ABNORMAL HIGH (ref 1.7–7.7)
Platelet Count: 332 10*3/uL (ref 150–400)
RBC: 7.07 MIL/uL — AB (ref 3.87–5.11)
RDW: 22.2 % — AB (ref 11.5–15.5)
WBC: 22.7 10*3/uL — AB (ref 4.0–10.5)
nRBC: 0.2 % (ref 0.0–0.2)

## 2019-03-03 LAB — FERRITIN: Ferritin: 34 ng/mL (ref 11–307)

## 2019-03-03 MED ORDER — HYDROXYUREA 500 MG PO CAPS: 500.0000 mg | ORAL_CAPSULE | Freq: Every day | ORAL | 1 refills | Status: DC

## 2019-03-03 NOTE — Patient Instructions (Signed)
Therapeutic Phlebotomy, Care After This sheet gives you information about how to care for yourself after your procedure. Your health care provider may also give you more specific instructions. If you have problems or questions, contact your health care provider. What can I expect after the procedure? After the procedure, it is common to have:  Light-headedness or dizziness. You may feel faint.  Nausea.  Tiredness (fatigue). Follow these instructions at home: Eating and drinking  Be sure to eat well-balanced meals for the next 24 hours.  Drink enough fluid to keep your urine pale yellow.  Avoid drinking alcohol on the day that you had the procedure. Activity   Return to your normal activities as told by your health care provider. Most people can go back to their normal activities right away.  Avoid activities that take a lot of effort for about 5 hours after the procedure. Athletes should avoid strenuous exercise for at least 12 hours.  Avoid heavy lifting or pulling for about 5 hours after the procedure. Do not lift anything that is heavier than 10 lb (4.5 kg).  Change positions slowly for the remainder of the day. This will help to prevent light-headedness or fainting.  If you feel light-headed, lie down until the feeling goes away. Needle insertion site care   Keep your bandage (dressing) dry. You can remove the bandage after about 5 hours or as told by your health care provider.  If you have bleeding from the needle insertion site, raise (elevate) your arm and press firmly on the site until the bleeding stops.  If you have bruising at the site, apply ice to the area: ? Remove the dressing. ? Put ice in a plastic bag. ? Place a towel between your skin and the bag. ? Leave the ice on for 20 minutes, 2-3 times a day for the first 24 hours.  If the swelling does not go away after 24 hours, apply a warm, moist cloth (warm compress) to the area for 20 minutes, 2-3 times a  day. General instructions  Do not use any products that contain nicotine or tobacco, such as cigarettes and e-cigarettes, for at least 30 minutes after the procedure.  Keep all follow-up visits as told by your health care provider. This is important. You may need to continue having regular therapeutic phlebotomy treatments as directed. Contact a health care provider if you:  Have redness, swelling, or pain at the needle insertion site.  Have fluid or blood coming from the needle insertion site.  Have pus or a bad smell coming from the needle insertion site.  Notice that the needle insertion site feels warm to the touch.  Feel light-headed, dizzy, or nauseous, and the feeling does not go away.  Have new bruising at the needle insertion site.  Feel weaker than normal.  Have a fever or chills. Get help right away if:  You faint.  You have chest pain.  You have trouble breathing.  You have severe nausea or vomiting. Summary  After the procedure, it is common to have some light-headedness, dizziness, nausea, or tiredness (fatigue).  Be sure to eat well-balanced meals for the next 24 hours. Drink enough fluid to keep your urine pale yellow.  Return to your normal activities as told by your health care provider.  Keep all follow-up visits as told by your health care provider. You may need to continue having regular therapeutic phlebotomy treatments as directed. This information is not intended to replace advice given to you   by your health care provider. Make sure you discuss any questions you have with your health care provider. Document Released: 05/11/2011 Document Revised: 12/23/2017 Document Reviewed: 12/23/2017 Elsevier Interactive Patient Education  2019 [employer]  

## 2019-03-03 NOTE — Progress Notes (Signed)
[name] presents today for phlebotomy per MD Burr Medico orders. Phlebotomy procedure started at 1212 and ended at 1218 550 grams removed. Patient observed for 30 minutes after procedure without any incident. Patient tolerated procedure well.  Ate and drank after TP.  VSS. 16 G LAC IV needle removed intact.

## 2019-03-03 NOTE — Telephone Encounter (Signed)
Scheduled appt per 3/13 los.  Patient declined calendar and avs.

## 2019-03-13 DIAGNOSIS — I70242 Atherosclerosis of native arteries of left leg with ulceration of calf: Secondary | ICD-10-CM | POA: Diagnosis not present

## 2019-03-23 ENCOUNTER — Other Ambulatory Visit: Payer: Self-pay

## 2019-03-23 ENCOUNTER — Inpatient Hospital Stay: Payer: Medicare Other | Attending: Hematology

## 2019-03-23 ENCOUNTER — Inpatient Hospital Stay: Payer: Medicare Other

## 2019-03-23 DIAGNOSIS — Z86718 Personal history of other venous thrombosis and embolism: Secondary | ICD-10-CM | POA: Insufficient documentation

## 2019-03-23 DIAGNOSIS — Z89611 Acquired absence of right leg above knee: Secondary | ICD-10-CM | POA: Insufficient documentation

## 2019-03-23 DIAGNOSIS — Z7982 Long term (current) use of aspirin: Secondary | ICD-10-CM | POA: Diagnosis not present

## 2019-03-23 DIAGNOSIS — I739 Peripheral vascular disease, unspecified: Secondary | ICD-10-CM | POA: Insufficient documentation

## 2019-03-23 DIAGNOSIS — Z7901 Long term (current) use of anticoagulants: Secondary | ICD-10-CM | POA: Insufficient documentation

## 2019-03-23 DIAGNOSIS — Z79899 Other long term (current) drug therapy: Secondary | ICD-10-CM | POA: Insufficient documentation

## 2019-03-23 DIAGNOSIS — D6959 Other secondary thrombocytopenia: Secondary | ICD-10-CM | POA: Diagnosis not present

## 2019-03-23 DIAGNOSIS — H409 Unspecified glaucoma: Secondary | ICD-10-CM | POA: Diagnosis not present

## 2019-03-23 DIAGNOSIS — I252 Old myocardial infarction: Secondary | ICD-10-CM | POA: Diagnosis not present

## 2019-03-23 DIAGNOSIS — D509 Iron deficiency anemia, unspecified: Secondary | ICD-10-CM | POA: Diagnosis not present

## 2019-03-23 DIAGNOSIS — D45 Polycythemia vera: Secondary | ICD-10-CM | POA: Diagnosis not present

## 2019-03-23 DIAGNOSIS — K219 Gastro-esophageal reflux disease without esophagitis: Secondary | ICD-10-CM | POA: Insufficient documentation

## 2019-03-23 DIAGNOSIS — I10 Essential (primary) hypertension: Secondary | ICD-10-CM | POA: Insufficient documentation

## 2019-03-23 DIAGNOSIS — E785 Hyperlipidemia, unspecified: Secondary | ICD-10-CM | POA: Diagnosis not present

## 2019-03-23 DIAGNOSIS — I2510 Atherosclerotic heart disease of native coronary artery without angina pectoris: Secondary | ICD-10-CM | POA: Diagnosis not present

## 2019-03-23 DIAGNOSIS — I480 Paroxysmal atrial fibrillation: Secondary | ICD-10-CM | POA: Diagnosis not present

## 2019-03-23 DIAGNOSIS — Z8673 Personal history of transient ischemic attack (TIA), and cerebral infarction without residual deficits: Secondary | ICD-10-CM | POA: Diagnosis not present

## 2019-03-23 DIAGNOSIS — M1990 Unspecified osteoarthritis, unspecified site: Secondary | ICD-10-CM | POA: Insufficient documentation

## 2019-03-23 LAB — CBC WITH DIFFERENTIAL (CANCER CENTER ONLY)
Abs Immature Granulocytes: 0.37 10*3/uL — ABNORMAL HIGH (ref 0.00–0.07)
Basophils Absolute: 0.2 10*3/uL — ABNORMAL HIGH (ref 0.0–0.1)
Basophils Relative: 2 %
Eosinophils Absolute: 0.4 10*3/uL (ref 0.0–0.5)
Eosinophils Relative: 3 %
HCT: 45.6 % (ref 36.0–46.0)
Hemoglobin: 13 g/dL (ref 12.0–15.0)
Immature Granulocytes: 3 %
Lymphocytes Relative: 8 %
Lymphs Abs: 1.1 10*3/uL (ref 0.7–4.0)
MCH: 21.1 pg — ABNORMAL LOW (ref 26.0–34.0)
MCHC: 28.5 g/dL — ABNORMAL LOW (ref 30.0–36.0)
MCV: 73.9 fL — ABNORMAL LOW (ref 80.0–100.0)
Monocytes Absolute: 0.2 10*3/uL (ref 0.1–1.0)
Monocytes Relative: 2 %
Neutro Abs: 12.2 10*3/uL — ABNORMAL HIGH (ref 1.7–7.7)
Neutrophils Relative %: 82 %
Platelet Count: 109 10*3/uL — ABNORMAL LOW (ref 150–400)
RBC: 6.17 MIL/uL — ABNORMAL HIGH (ref 3.87–5.11)
RDW: 23.5 % — ABNORMAL HIGH (ref 11.5–15.5)
WBC Count: 14.5 10*3/uL — ABNORMAL HIGH (ref 4.0–10.5)
nRBC: 0 % (ref 0.0–0.2)

## 2019-03-23 NOTE — Progress Notes (Signed)
Pt HCT today is 45.6, per Dr. Burr Medico, pt does not meet parameters for phlebotomy.  Dr. Burr Medico wanted this RN to notify pt and educate her on continuing to take Hydrea and Dr. Burr Medico will follow up with the pt on 04/14/2019 to re assess her labs.  Pt educated on why she did not need a phlebotomy today and to continue taking Hydrea and Dr. Burr Medico will re assess pts labs on 04/14/2019.  Pt very appreciative and verbalizes understanding.

## 2019-03-24 ENCOUNTER — Other Ambulatory Visit: Payer: Medicare Other

## 2019-03-27 ENCOUNTER — Encounter (HOSPITAL_BASED_OUTPATIENT_CLINIC_OR_DEPARTMENT_OTHER): Payer: Medicare Other | Attending: Internal Medicine

## 2019-03-27 ENCOUNTER — Other Ambulatory Visit: Payer: Self-pay

## 2019-03-27 DIAGNOSIS — I70242 Atherosclerosis of native arteries of left leg with ulceration of calf: Secondary | ICD-10-CM | POA: Diagnosis not present

## 2019-03-27 DIAGNOSIS — L97223 Non-pressure chronic ulcer of left calf with necrosis of muscle: Secondary | ICD-10-CM | POA: Insufficient documentation

## 2019-04-10 DIAGNOSIS — I70242 Atherosclerosis of native arteries of left leg with ulceration of calf: Secondary | ICD-10-CM | POA: Diagnosis not present

## 2019-04-11 ENCOUNTER — Ambulatory Visit: Payer: Medicare Other | Admitting: Cardiovascular Disease
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
## 2019-04-14 ENCOUNTER — Telehealth: Payer: Self-pay | Admitting: Hematology

## 2019-04-14 ENCOUNTER — Inpatient Hospital Stay (HOSPITAL_BASED_OUTPATIENT_CLINIC_OR_DEPARTMENT_OTHER): Payer: Medicare Other | Admitting: Hematology

## 2019-04-14 ENCOUNTER — Other Ambulatory Visit: Payer: Self-pay

## 2019-04-14 ENCOUNTER — Encounter: Payer: Self-pay | Admitting: Hematology

## 2019-04-14 ENCOUNTER — Inpatient Hospital Stay: Payer: Medicare Other

## 2019-04-14 VITALS — BP 194/67 | HR 70 | Temp 98.0°F | Resp 20 | Ht 65.5 in

## 2019-04-14 VITALS — BP 177/77 | HR 86 | Temp 99.1°F | Resp 16

## 2019-04-14 DIAGNOSIS — D45 Polycythemia vera: Secondary | ICD-10-CM

## 2019-04-14 DIAGNOSIS — I2510 Atherosclerotic heart disease of native coronary artery without angina pectoris: Secondary | ICD-10-CM

## 2019-04-14 DIAGNOSIS — I10 Essential (primary) hypertension: Secondary | ICD-10-CM

## 2019-04-14 DIAGNOSIS — K219 Gastro-esophageal reflux disease without esophagitis: Secondary | ICD-10-CM

## 2019-04-14 DIAGNOSIS — D6959 Other secondary thrombocytopenia: Secondary | ICD-10-CM

## 2019-04-14 DIAGNOSIS — Z79899 Other long term (current) drug therapy: Secondary | ICD-10-CM

## 2019-04-14 DIAGNOSIS — I480 Paroxysmal atrial fibrillation: Secondary | ICD-10-CM

## 2019-04-14 DIAGNOSIS — D509 Iron deficiency anemia, unspecified: Secondary | ICD-10-CM

## 2019-04-14 DIAGNOSIS — Z7901 Long term (current) use of anticoagulants: Secondary | ICD-10-CM

## 2019-04-14 DIAGNOSIS — Z7982 Long term (current) use of aspirin: Secondary | ICD-10-CM

## 2019-04-14 DIAGNOSIS — I739 Peripheral vascular disease, unspecified: Secondary | ICD-10-CM

## 2019-04-14 DIAGNOSIS — H409 Unspecified glaucoma: Secondary | ICD-10-CM

## 2019-04-14 DIAGNOSIS — M1990 Unspecified osteoarthritis, unspecified site: Secondary | ICD-10-CM

## 2019-04-14 DIAGNOSIS — E785 Hyperlipidemia, unspecified: Secondary | ICD-10-CM

## 2019-04-14 DIAGNOSIS — I252 Old myocardial infarction: Secondary | ICD-10-CM

## 2019-04-14 LAB — CBC WITH DIFFERENTIAL (CANCER CENTER ONLY)
Abs Immature Granulocytes: 0.1 10*3/uL — ABNORMAL HIGH (ref 0.00–0.07)
Basophils Absolute: 0.2 10*3/uL — ABNORMAL HIGH (ref 0.0–0.1)
Basophils Relative: 2 %
Eosinophils Absolute: 0.4 10*3/uL (ref 0.0–0.5)
Eosinophils Relative: 3 %
HCT: 47.7 % — ABNORMAL HIGH (ref 36.0–46.0)
Hemoglobin: 13.3 g/dL (ref 12.0–15.0)
Immature Granulocytes: 1 %
Lymphocytes Relative: 8 %
Lymphs Abs: 1.2 10*3/uL (ref 0.7–4.0)
MCH: 21.6 pg — ABNORMAL LOW (ref 26.0–34.0)
MCHC: 27.9 g/dL — ABNORMAL LOW (ref 30.0–36.0)
MCV: 77.4 fL — ABNORMAL LOW (ref 80.0–100.0)
Monocytes Absolute: 0.2 10*3/uL (ref 0.1–1.0)
Monocytes Relative: 1 %
Neutro Abs: 12.8 10*3/uL — ABNORMAL HIGH (ref 1.7–7.7)
Neutrophils Relative %: 85 %
Platelet Count: 114 10*3/uL — ABNORMAL LOW (ref 150–400)
RBC: 6.16 MIL/uL — ABNORMAL HIGH (ref 3.87–5.11)
RDW: 25.7 % — ABNORMAL HIGH (ref 11.5–15.5)
WBC Count: 14.8 10*3/uL — ABNORMAL HIGH (ref 4.0–10.5)
nRBC: 0 % (ref 0.0–0.2)

## 2019-04-14 NOTE — Patient Instructions (Signed)
Therapeutic Phlebotomy, Care After This sheet gives you information about how to care for yourself after your procedure. Your health care provider may also give you more specific instructions. If you have problems or questions, contact your health care provider. What can I expect after the procedure? After the procedure, it is common to have:  Light-headedness or dizziness. You may feel faint.  Nausea.  Tiredness (fatigue). Follow these instructions at home: Eating and drinking  Be sure to eat well-balanced meals for the next 24 hours.  Drink enough fluid to keep your urine pale yellow.  Avoid drinking alcohol on the day that you had the procedure. Activity   Return to your normal activities as told by your health care provider. Most people can go back to their normal activities right away.  Avoid activities that take a lot of effort for about 5 hours after the procedure. Athletes should avoid strenuous exercise for at least 12 hours.  Avoid heavy lifting or pulling for about 5 hours after the procedure. Do not lift anything that is heavier than 10 lb (4.5 kg).  Change positions slowly for the remainder of the day. This will help to prevent light-headedness or fainting.  If you feel light-headed, lie down until the feeling goes away. Needle insertion site care   Keep your bandage (dressing) dry. You can remove the bandage after about 5 hours or as told by your health care provider.  If you have bleeding from the needle insertion site, raise (elevate) your arm and press firmly on the site until the bleeding stops.  If you have bruising at the site, apply ice to the area: ? Remove the dressing. ? Put ice in a plastic bag. ? Place a towel between your skin and the bag. ? Leave the ice on for 20 minutes, 2-3 times a day for the first 24 hours.  If the swelling does not go away after 24 hours, apply a warm, moist cloth (warm compress) to the area for 20 minutes, 2-3 times a  day. General instructions  Do not use any products that contain nicotine or tobacco, such as cigarettes and e-cigarettes, for at least 30 minutes after the procedure.  Keep all follow-up visits as told by your health care provider. This is important. You may need to continue having regular therapeutic phlebotomy treatments as directed. Contact a health care provider if you:  Have redness, swelling, or pain at the needle insertion site.  Have fluid or blood coming from the needle insertion site.  Have pus or a bad smell coming from the needle insertion site.  Notice that the needle insertion site feels warm to the touch.  Feel light-headed, dizzy, or nauseous, and the feeling does not go away.  Have new bruising at the needle insertion site.  Feel weaker than normal.  Have a fever or chills. Get help right away if:  You faint.  You have chest pain.  You have trouble breathing.  You have severe nausea or vomiting. Summary  After the procedure, it is common to have some light-headedness, dizziness, nausea, or tiredness (fatigue).  Be sure to eat well-balanced meals for the next 24 hours. Drink enough fluid to keep your urine pale yellow.  Return to your normal activities as told by your health care provider.  Keep all follow-up visits as told by your health care provider. You may need to continue having regular therapeutic phlebotomy treatments as directed. This information is not intended to replace advice given to you   by your health care provider. Make sure you discuss any questions you have with your health care provider. Document Released: 05/11/2011 Document Revised: 12/23/2017 Document Reviewed: 12/23/2017 Elsevier Interactive Patient Education  2019 [employer]  

## 2019-04-14 NOTE — Telephone Encounter (Signed)
Scheduled appt per 4/24 los. °

## 2019-04-14 NOTE — Progress Notes (Signed)
[name] presents today for phlebotomy per MD orders. Phlebotomy procedure started at 1240 and ended at 1252. 525 grams removed via 18 gauge needle to left AC. Patient observed for 30 minutes after procedure without any incident. Patient tolerated procedure well. Pt given post procedure snacks.

## 2019-04-24 ENCOUNTER — Encounter (HOSPITAL_BASED_OUTPATIENT_CLINIC_OR_DEPARTMENT_OTHER): Payer: Medicare Other | Attending: Internal Medicine

## 2019-04-24 ENCOUNTER — Other Ambulatory Visit: Payer: Self-pay

## 2019-04-24 DIAGNOSIS — L97822 Non-pressure chronic ulcer of other part of left lower leg with fat layer exposed: Secondary | ICD-10-CM | POA: Diagnosis not present

## 2019-04-24 DIAGNOSIS — I70248 Atherosclerosis of native arteries of left leg with ulceration of other part of lower left leg: Secondary | ICD-10-CM | POA: Insufficient documentation

## 2019-04-29 ENCOUNTER — Other Ambulatory Visit: Payer: Self-pay | Admitting: Hematology

## 2019-05-01 DIAGNOSIS — I70248 Atherosclerosis of native arteries of left leg with ulceration of other part of lower left leg: Secondary | ICD-10-CM | POA: Diagnosis not present

## 2019-05-08 DIAGNOSIS — I70248 Atherosclerosis of native arteries of left leg with ulceration of other part of lower left leg: Secondary | ICD-10-CM | POA: Diagnosis not present

## 2019-05-22 ENCOUNTER — Other Ambulatory Visit: Payer: Self-pay

## 2019-05-22 ENCOUNTER — Encounter (HOSPITAL_BASED_OUTPATIENT_CLINIC_OR_DEPARTMENT_OTHER): Payer: Medicare Other | Attending: Internal Medicine

## 2019-05-22 DIAGNOSIS — I70242 Atherosclerosis of native arteries of left leg with ulceration of calf: Secondary | ICD-10-CM | POA: Insufficient documentation

## 2019-05-22 DIAGNOSIS — L97222 Non-pressure chronic ulcer of left calf with fat layer exposed: Secondary | ICD-10-CM | POA: Diagnosis not present

## 2019-05-23 ENCOUNTER — Telehealth: Payer: Self-pay | Admitting: Hematology

## 2019-05-23 NOTE — Telephone Encounter (Signed)
Patient called to move 6/5 appointments to 6/12. Patient has new date/time.

## 2019-05-23 NOTE — Telephone Encounter (Signed)
Called pt to r/s appt per 6/2 sch message - left message for patient to call back to reschedule appt

## 2019-05-26 ENCOUNTER — Inpatient Hospital Stay: Payer: Medicare Other

## 2019-05-31 ENCOUNTER — Other Ambulatory Visit: Payer: Self-pay | Admitting: Hematology

## 2019-06-02 ENCOUNTER — Inpatient Hospital Stay: Payer: Medicare Other

## 2019-06-02 ENCOUNTER — Other Ambulatory Visit: Payer: Self-pay

## 2019-06-02 ENCOUNTER — Inpatient Hospital Stay: Payer: Medicare Other | Attending: Hematology

## 2019-06-02 DIAGNOSIS — D45 Polycythemia vera: Secondary | ICD-10-CM | POA: Diagnosis present

## 2019-06-02 LAB — CBC WITH DIFFERENTIAL (CANCER CENTER ONLY)
Abs Immature Granulocytes: 0.04 10*3/uL (ref 0.00–0.07)
Basophils Absolute: 0.1 10*3/uL (ref 0.0–0.1)
Basophils Relative: 1 %
Eosinophils Absolute: 0.2 10*3/uL (ref 0.0–0.5)
Eosinophils Relative: 2 %
HCT: 43.2 % (ref 36.0–46.0)
Hemoglobin: 12.4 g/dL (ref 12.0–15.0)
Immature Granulocytes: 0 %
Lymphocytes Relative: 9 %
Lymphs Abs: 1 10*3/uL (ref 0.7–4.0)
MCH: 23.6 pg — ABNORMAL LOW (ref 26.0–34.0)
MCHC: 28.7 g/dL — ABNORMAL LOW (ref 30.0–36.0)
MCV: 82.1 fL (ref 80.0–100.0)
Monocytes Absolute: 0.2 10*3/uL (ref 0.1–1.0)
Monocytes Relative: 2 %
Neutro Abs: 8.8 10*3/uL — ABNORMAL HIGH (ref 1.7–7.7)
Neutrophils Relative %: 86 %
Platelet Count: 116 10*3/uL — ABNORMAL LOW (ref 150–400)
RBC: 5.26 MIL/uL — ABNORMAL HIGH (ref 3.87–5.11)
RDW: 19.8 % — ABNORMAL HIGH (ref 11.5–15.5)
WBC Count: 10.4 10*3/uL (ref 4.0–10.5)
nRBC: 0 % (ref 0.0–0.2)

## 2019-06-02 NOTE — Progress Notes (Signed)
No phlebotomy treatment today per treatment parameters.

## 2019-06-05 DIAGNOSIS — I70242 Atherosclerosis of native arteries of left leg with ulceration of calf: Secondary | ICD-10-CM | POA: Diagnosis not present

## 2019-06-19 DIAGNOSIS — I70242 Atherosclerosis of native arteries of left leg with ulceration of calf: Secondary | ICD-10-CM | POA: Diagnosis not present

## 2019-06-27 ENCOUNTER — Other Ambulatory Visit: Payer: Self-pay | Admitting: Hematology

## 2019-06-27 ENCOUNTER — Ambulatory Visit: Payer: Self-pay | Admitting: Physician Assistant

## 2019-06-29 ENCOUNTER — Other Ambulatory Visit: Payer: Self-pay

## 2019-06-30 ENCOUNTER — Telehealth: Payer: Self-pay | Admitting: Hematology

## 2019-06-30 ENCOUNTER — Encounter: Payer: Self-pay | Admitting: Physician Assistant

## 2019-06-30 ENCOUNTER — Ambulatory Visit (INDEPENDENT_AMBULATORY_CARE_PROVIDER_SITE_OTHER): Payer: Medicare Other | Admitting: Physician Assistant

## 2019-06-30 VITALS — BP 150/74 | HR 93 | Temp 99.0°F | Ht 65.5 in | Wt 126.1 lb

## 2019-06-30 DIAGNOSIS — M1712 Unilateral primary osteoarthritis, left knee: Secondary | ICD-10-CM

## 2019-06-30 DIAGNOSIS — E7800 Pure hypercholesterolemia, unspecified: Secondary | ICD-10-CM | POA: Diagnosis not present

## 2019-06-30 DIAGNOSIS — I4891 Unspecified atrial fibrillation: Secondary | ICD-10-CM | POA: Diagnosis not present

## 2019-06-30 DIAGNOSIS — Z8673 Personal history of transient ischemic attack (TIA), and cerebral infarction without residual deficits: Secondary | ICD-10-CM | POA: Diagnosis not present

## 2019-06-30 DIAGNOSIS — M810 Age-related osteoporosis without current pathological fracture: Secondary | ICD-10-CM

## 2019-06-30 DIAGNOSIS — I214 Non-ST elevation (NSTEMI) myocardial infarction: Secondary | ICD-10-CM

## 2019-06-30 DIAGNOSIS — Z89611 Acquired absence of right leg above knee: Secondary | ICD-10-CM

## 2019-06-30 DIAGNOSIS — D45 Polycythemia vera: Secondary | ICD-10-CM

## 2019-06-30 DIAGNOSIS — I739 Peripheral vascular disease, unspecified: Secondary | ICD-10-CM | POA: Diagnosis not present

## 2019-06-30 DIAGNOSIS — I10 Essential (primary) hypertension: Secondary | ICD-10-CM

## 2019-06-30 DIAGNOSIS — Z7901 Long term (current) use of anticoagulants: Secondary | ICD-10-CM

## 2019-06-30 MED ORDER — LISINOPRIL 20 MG PO TABS: 20.0000 mg | ORAL_TABLET | Freq: Two times a day (BID) | ORAL | 11 refills | Status: DC

## 2019-06-30 MED ORDER — OMEPRAZOLE 20 MG PO CPDR: 20.0000 mg | DELAYED_RELEASE_CAPSULE | Freq: Two times a day (BID) | ORAL | 11 refills | Status: DC

## 2019-06-30 MED ORDER — ALENDRONATE SODIUM 70 MG PO TABS: 70.0000 mg | ORAL_TABLET | ORAL | 11 refills | Status: DC

## 2019-06-30 MED ORDER — FLECAINIDE ACETATE 50 MG PO TABS: 50.0000 mg | ORAL_TABLET | Freq: Two times a day (BID) | ORAL | 11 refills | Status: DC

## 2019-06-30 MED ORDER — FENOFIBRATE 145 MG PO TABS: 145.0000 mg | ORAL_TABLET | Freq: Every day | ORAL | 11 refills | Status: DC

## 2019-06-30 MED ORDER — HYDROXYUREA 500 MG PO CAPS: ORAL_CAPSULE | ORAL | 11 refills | Status: DC

## 2019-06-30 MED ORDER — TRAMADOL HCL 50 MG PO TABS: 50.0000 mg | ORAL_TABLET | Freq: Three times a day (TID) | ORAL | 2 refills | Status: AC | PRN

## 2019-06-30 MED ORDER — DILTIAZEM HCL ER COATED BEADS 180 MG PO CP24: 180.0000 mg | ORAL_CAPSULE | Freq: Every morning | ORAL | 3 refills | Status: DC

## 2019-06-30 MED ORDER — APIXABAN 5 MG PO TABS: 5.0000 mg | ORAL_TABLET | Freq: Two times a day (BID) | ORAL | 11 refills | Status: DC

## 2019-06-30 NOTE — Telephone Encounter (Signed)
Scheduled appt per 7/10 sch message - unable to reach pt . Left message with appt date and time

## 2019-07-03 ENCOUNTER — Encounter (HOSPITAL_BASED_OUTPATIENT_CLINIC_OR_DEPARTMENT_OTHER): Payer: Medicare Other | Attending: Internal Medicine

## 2019-07-03 DIAGNOSIS — L97222 Non-pressure chronic ulcer of left calf with fat layer exposed: Secondary | ICD-10-CM | POA: Diagnosis not present

## 2019-07-03 DIAGNOSIS — L97822 Non-pressure chronic ulcer of other part of left lower leg with fat layer exposed: Secondary | ICD-10-CM | POA: Diagnosis present

## 2019-07-03 DIAGNOSIS — I70242 Atherosclerosis of native arteries of left leg with ulceration of calf: Secondary | ICD-10-CM | POA: Insufficient documentation

## 2019-07-04 NOTE — Progress Notes (Signed)
BP (!) 150/74   Pulse 93   Temp 99 F (37.2 C) (Oral)   Ht 5' 5.5" (1.664 m)   Wt 126 lb 1.6 oz (57.2 kg)   BMI 20.66 kg/m    Subjective:    Patient ID: [name], female    DOB: February 27, 1951, 68 y.o.   MRN: [id number]  HPI: [name] is a 68 y.o. female presenting on 06/30/2019 for New Patient (Initial Visit)  This patient comes in to be established with our patient she had been a patient of Dr. Edrick Oh over the past few years.  She does have several specialist that she sees for cardiology, vascular, orthopedics, hematology due to polycythemia vera.  Her past medical history is quite extensive and does include atrial fibrillation, coronary artery disease, hypertension, carotid artery disease with history of stroke, migraine, peripheral arterial disease, GERD, osteoarthritis of joints, hyperlipidemia, polycythemia vera, right below the knee amputation secondary to vascular disease and trauma.  The patient states that at the current time she is fairly stable and not having any difficulties.  She does need a few refills on her maintenance medications and will have labs performed in the near future I reviewed her notes from [facility] and care everywhere.  Past Medical History:  Diagnosis Date  . Arthritis    "fingers, some in my knees" (01/28/2016)  . CAD (coronary artery disease), per cath 04/27/13, non obstructive disease, EF 65-70% 05/11/2013  . Carotid disease, bilateral ([place]) 09/2013   Lt carotid 50-69% stentosis, less on rt  . Dysrhythmia   . Fatty liver   . GERD (gastroesophageal reflux disease)   . Glaucoma   . H/O cardiovascular stress test 12/27/2009   negative for ischemia  . Headache    "occasional since eye pressure regulated" (01/28/2016)  . Hyperlipidemia   . Hypertension   . Leukocytosis 07/15/2015  . Migraine    "stopped w/laser holes to relieve pressure in my eyes; had them 3-4 times/wk before taht" (01/28/2016)  . Mitral regurgitation,2+ by cath 05/11/2013  . NSTEMI  (non-ST elevated myocardial infarction) ([place]) 04/27/2013  . Pain    BOTH KNEES - PT HAS TORN MENISCUS LEFT KNEE  . Paroxysmal atrial fibrillation (HCC)    hx of a fib on ASA  AND ELIQUIS   . Polycythemia vera ([place]) 08/20/2015   JAK-2 positive 07/19/15  . PVD (peripheral vascular disease) with claudication ([place]) 07/2006   previous L ext iliac stent and rt SFA stent, known occluded Lt SFA  . S/P cardiac cath 04/27/13   NON OBSTRUCTIVE DISEASE, 2+ mr, ef 65-70%  . Statin intolerance   . Stroke ([place]) 2006   SWELLING ALL OVER AND RT SIDE OF FACE DRAWN AND SPEECH SLURRED AND NUMBNESS ON RIGHT SIDE-- ALL RESOLVED   Relevant past medical, surgical, family and social history reviewed and updated as indicated. Interim medical history since our last visit reviewed. Allergies and medications reviewed and updated. DATA REVIEWED: CHART IN EPIC  Family History reviewed for pertinent findings.  Review of Systems  Constitutional: Negative.  Negative for activity change, fatigue and fever.  HENT: Negative.   Eyes: Negative.   Respiratory: Negative.  Negative for cough, shortness of breath and wheezing.   Cardiovascular: Positive for palpitations. Negative for chest pain and leg swelling.  Gastrointestinal: Negative.  Negative for abdominal pain.  Endocrine: Negative.   Genitourinary: Negative.  Negative for dysuria.  Musculoskeletal: Positive for arthralgias, back pain and myalgias.  Skin: Negative.   Neurological: Negative.  Allergies as of 06/30/2019      Reactions   Atenolol Rash, Itching   Demerol  [meperidine Hcl] Itching   Demerol [meperidine] Other ([name] Comments), Itching   Unknown, can't remember  Unknown, can't remember    Gabapentin Anxiety, Other ([name] Comments)   SI and HI   Inderal [propranolol] Other ([name] Comments), Itching   Doesn't remember    Prednisone Other ([name] Comments)   Muscle spasms   Statins Itching, Other ([name] Comments)   Simvastatin-caused severe itching  Pravastatin-caused lesser tiching One type of statin? Caused stroke in 2006, and other symptoms as result   Warfarin And Related Other ([name] Comments)   Caused nose bleeds   Crestor [rosuvastatin] Itching, Rash   Docosahexaenoic Acid (dha)    Other reaction(s): Other ([name] Comments) nosebleeds   Lactose Intolerance (gi) Other ([name] Comments)   Bloating, gas   Lipitor [atorvastatin] Rash   Lovaza [omega-3-acid Ethyl Esters] Other ([name] Comments)   nosebleeds   Penicillins Hives, Rash, Other ([name] Comments)   Has patient had a PCN reaction causing immediate rash, facial/tongue/throat swelling, SOB or lightheadedness with hypotension: Yes Has patient had a PCN reaction causing severe rash involving mucus membranes or skin necrosis: No Has patient had a PCN reaction that required hospitalization: No Has patient had a PCN reaction occurring within the last 10 years: No If all of the above answers are "NO", then may proceed with Cephalosporin use. Questionable high fever    Pravastatin Itching, Rash   Simvastatin Itching, Rash, Other ([name] Comments)   Trilipix [choline Fenofibrate] Other ([name] Comments)   Doesn't remember    Warfarin Other ([name] Comments)   nosebleeds   Hydralazine Swelling   Effexor [venlafaxine Hcl] Other ([name] Comments)   Doesn't remember    Nexium [esomeprazole Magnesium] Rash   Percocet [oxycodone-acetaminophen] Other ([name] Comments)   Doesn't remember    Plavix [clopidogrel Bisulfate] Rash      Medication List       Accurate as of June 30, 2019 11:59 PM. If you have any questions, ask your nurse or doctor.        STOP taking these medications   calcium-vitamin D 250-100 MG-UNIT tablet Stopped by: Terald Sleeper, PA-C   cyclobenzaprine 7.5 MG tablet Commonly known as: FEXMID Stopped by: Terald Sleeper, PA-C   diclofenac sodium 1 % Gel Commonly known as: VOLTAREN Stopped by: Terald Sleeper, PA-C     TAKE these medications   acetaminophen 500 MG tablet  Commonly known as: TYLENOL Take 2 tablets (1,000 mg total) by mouth every 6 (six) hours.   alendronate 70 MG tablet Commonly known as: FOSAMAX Take 1 tablet (70 mg total) by mouth once a week. What changed:   how much to take  how to take this  when to take this Changed by: Terald Sleeper, PA-C   apixaban 5 MG Tabs tablet Commonly known as: Eliquis Take 1 tablet (5 mg total) by mouth 2 (two) times daily.   ARTIFICIAL TEAR OP Place 1 drop into both eyes as needed (for dry eyes).   aspirin EC 325 MG tablet Take 325 mg by mouth daily.   diltiazem 180 MG 24 hr capsule Commonly known as: CARDIZEM CD Take 1 capsule (180 mg total) by mouth every morning.   docusate sodium 100 MG capsule Commonly known as: COLACE Take 200 mg by mouth every evening.   ezetimibe 10 MG tablet Commonly known as: ZETIA TAKE 1 TABLET DAILY   fenofibrate 145  MG tablet Commonly known as: TRICOR Take 1 tablet (145 mg total) by mouth at bedtime. PLEASE CONTACT OFFICE FOR ADDITIONAL REFILLS   flecainide 50 MG tablet Commonly known as: TAMBOCOR Take 1 tablet (50 mg total) by mouth 2 (two) times daily. PLEASE CONTACT OFFICE FOR ADDITIONAL REFILLS   hydroxyurea 500 MG capsule Commonly known as: HYDREA TAKE 1 CAPSULE DAILY. MAY TAKE WITH FOOD TO MINIMIZE GI SIDE EFFECTS   lisinopril 20 MG tablet Commonly known as: ZESTRIL Take 1 tablet (20 mg total) by mouth 2 (two) times daily.   omeprazole 20 MG capsule Commonly known as: PRILOSEC Take 1 capsule (20 mg total) by mouth 2 (two) times daily before a meal.   traMADol 50 MG tablet Commonly known as: ULTRAM Take 1 tablet (50 mg total) by mouth every 8 (eight) hours as needed for up to 5 days for severe pain. Started by: Terald Sleeper, PA-C          Objective:    BP (!) 150/74   Pulse 93   Temp 99 F (37.2 C) (Oral)   Ht 5' 5.5" (1.664 m)   Wt 126 lb 1.6 oz (57.2 kg)   BMI 20.66 kg/m   Allergies  Allergen Reactions  . Atenolol Rash  and Itching  . Demerol  [Meperidine Hcl] Itching  . Demerol [Meperidine] Other ([name] Comments) and Itching    Unknown, can't remember  Unknown, can't remember   . Gabapentin Anxiety and Other ([name] Comments)    SI and HI  . Inderal [Propranolol] Other ([name] Comments) and Itching    Doesn't remember   . Prednisone Other ([name] Comments)    Muscle spasms  . Statins Itching and Other ([name] Comments)    Simvastatin-caused severe itching Pravastatin-caused lesser tiching One type of statin? Caused stroke in 2006, and other symptoms as result  . Warfarin And Related Other ([name] Comments)    Caused nose bleeds  . Crestor [Rosuvastatin] Itching and Rash  . Docosahexaenoic Acid (Dha)     Other reaction(s): Other ([name] Comments) nosebleeds  . Lactose Intolerance (Gi) Other ([name] Comments)    Bloating, gas  . Lipitor [Atorvastatin] Rash  . Lovaza [Omega-3-Acid Ethyl Esters] Other ([name] Comments)    nosebleeds   . Penicillins Hives, Rash and Other ([name] Comments)    Has patient had a PCN reaction causing immediate rash, facial/tongue/throat swelling, SOB or lightheadedness with hypotension: Yes Has patient had a PCN reaction causing severe rash involving mucus membranes or skin necrosis: No Has patient had a PCN reaction that required hospitalization: No Has patient had a PCN reaction occurring within the last 10 years: No If all of the above answers are "NO", then may proceed with Cephalosporin use.  Questionable high fever   . Pravastatin Itching and Rash  . Simvastatin Itching, Rash and Other ([name] Comments)  . Trilipix [Choline Fenofibrate] Other ([name] Comments)    Doesn't remember   . Warfarin Other ([name] Comments)    nosebleeds  . Hydralazine Swelling  . Effexor [Venlafaxine Hcl] Other ([name] Comments)    Doesn't remember   . Nexium [Esomeprazole Magnesium] Rash  . Percocet [Oxycodone-Acetaminophen] Other ([name] Comments)    Doesn't remember   . Plavix [Clopidogrel Bisulfate] Rash    Wt  Readings from Last 3 Encounters:  06/30/19 126 lb 1.6 oz (57.2 kg)  03/03/19 119 lb (54 kg)  01/10/19 112 lb (50.8 kg)    Physical Exam Constitutional:      Appearance: She is well-developed.  HENT:     Head: Normocephalic and atraumatic.  Eyes:     Conjunctiva/sclera: Conjunctivae normal.     Pupils: Pupils are equal, round, and reactive to light.  Cardiovascular:     Rate and Rhythm: Normal rate and regular rhythm.     Heart sounds: Normal heart sounds.  Pulmonary:     Effort: Pulmonary effort is normal.     Breath sounds: Normal breath sounds.  Abdominal:     General: Bowel sounds are normal.     Palpations: Abdomen is soft.  Skin:    General: Skin is warm and dry.     Findings: No rash.  Neurological:     Mental Status: She is alert and oriented to person, place, and time.     Deep Tendon Reflexes: Reflexes are normal and symmetric.  Psychiatric:        Behavior: Behavior normal.        Thought Content: Thought content normal.        Judgment: Judgment normal.     Results for orders placed or performed in visit on 06/02/19  CBC with Differential ([facility] Only)  Result Value Ref Range   WBC Count 10.4 4.0 - 10.5 K/uL   RBC 5.26 (H) 3.87 - 5.11 MIL/uL   Hemoglobin 12.4 12.0 - 15.0 g/dL   HCT 43.2 36.0 - 46.0 %   MCV 82.1 80.0 - 100.0 fL   MCH 23.6 (L) 26.0 - 34.0 pg   MCHC 28.7 (L) 30.0 - 36.0 g/dL   RDW 19.8 (H) 11.5 - 15.5 %   Platelet Count 116 (L) 150 - 400 K/uL   nRBC 0.0 0.0 - 0.2 %   Neutrophils Relative % 86 %   Neutro Abs 8.8 (H) 1.7 - 7.7 K/uL   Lymphocytes Relative 9 %   Lymphs Abs 1.0 0.7 - 4.0 K/uL   Monocytes Relative 2 %   Monocytes Absolute 0.2 0.1 - 1.0 K/uL   Eosinophils Relative 2 %   Eosinophils Absolute 0.2 0.0 - 0.5 K/uL   Basophils Relative 1 %   Basophils Absolute 0.1 0.0 - 0.1 K/uL   Smear Review POLYCHROMASIA    Immature Granulocytes 0 %   Abs Immature Granulocytes 0.04 0.00 - 0.07 K/uL      Assessment & Plan:   1. PAD  (peripheral artery disease), Lt carotid 50-70%: , Hx stent Lt exteral iliac & RSFA stent, known occl. LSFA  - CBC with Differential/Platelet; Future - CMP14+EGFR; Future - Lipid panel; Future - TSH; Future  2. Atrial fibrillation with RVR, hx of PAF previously - CBC with Differential/Platelet; Future - CMP14+EGFR; Future - Lipid panel; Future - TSH; Future  3. Pure hypercholesterolemia  4. History of CVA (cerebrovascular accident) - ezetimibe (ZETIA) 10 MG tablet; TAKE 1 TABLET DAILY  5. NSTEMI (non-ST elevated myocardial infarction) (HCC) - ezetimibe (ZETIA) 10 MG tablet; TAKE 1 TABLET DAILY - CBC with Differential/Platelet; Future - CMP14+EGFR; Future - Lipid panel; Future - TSH; Future  6. Chronic anticoagulation, new- eliquis Continue medications  7. Primary osteoarthritis of left knee Continue medications  8. Polycythemia vera ([place]) Continue hematology  9. PVD (peripheral vascular disease) ([place]) Continue medications  10. Amputee, above knee, right ([place]) Follow as needed  11. Osteoporosis, unspecified osteoporosis type, unspecified pathological fracture presence  - alendronate (FOSAMAX) 70 MG tablet; Take 1 tablet (70 mg total) by mouth once a week.  Dispense: 4 tablet; Refill: 11  12. Essential hypertension Continue medications   Continue all  other maintenance medications as listed above.  Follow up plan: Return in about 4 weeks (around 07/28/2019).  Educational handout given for [place] PA-C [place] [address]  [place], [place] [address] [phone]   07/04/2019, 7:54 AM

## 2019-07-07 ENCOUNTER — Other Ambulatory Visit: Payer: Medicare Other

## 2019-07-07 ENCOUNTER — Ambulatory Visit: Payer: Medicare Other | Admitting: Hematology

## 2019-07-17 DIAGNOSIS — I70242 Atherosclerosis of native arteries of left leg with ulceration of calf: Secondary | ICD-10-CM | POA: Diagnosis not present
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
## 2019-07-28 ENCOUNTER — Telehealth: Payer: Self-pay | Admitting: Hematology

## 2019-07-28 ENCOUNTER — Inpatient Hospital Stay: Payer: Medicare Other

## 2019-07-28 ENCOUNTER — Ambulatory Visit: Payer: Medicare Other | Admitting: Physician Assistant

## 2019-07-28 ENCOUNTER — Other Ambulatory Visit: Payer: Self-pay

## 2019-07-28 ENCOUNTER — Inpatient Hospital Stay (HOSPITAL_BASED_OUTPATIENT_CLINIC_OR_DEPARTMENT_OTHER): Payer: Medicare Other | Admitting: Hematology

## 2019-07-28 ENCOUNTER — Inpatient Hospital Stay: Payer: Medicare Other | Attending: Hematology

## 2019-07-28 VITALS — BP 132/61 | HR 67 | Temp 99.1°F | Resp 18 | Ht 65.5 in | Wt 130.3 lb

## 2019-07-28 DIAGNOSIS — D45 Polycythemia vera: Secondary | ICD-10-CM

## 2019-07-28 DIAGNOSIS — Z86718 Personal history of other venous thrombosis and embolism: Secondary | ICD-10-CM | POA: Diagnosis not present

## 2019-07-28 DIAGNOSIS — Z8673 Personal history of transient ischemic attack (TIA), and cerebral infarction without residual deficits: Secondary | ICD-10-CM | POA: Diagnosis not present

## 2019-07-28 DIAGNOSIS — I10 Essential (primary) hypertension: Secondary | ICD-10-CM | POA: Insufficient documentation

## 2019-07-28 DIAGNOSIS — Z7901 Long term (current) use of anticoagulants: Secondary | ICD-10-CM | POA: Insufficient documentation

## 2019-07-28 DIAGNOSIS — D6959 Other secondary thrombocytopenia: Secondary | ICD-10-CM | POA: Insufficient documentation

## 2019-07-28 DIAGNOSIS — Z7982 Long term (current) use of aspirin: Secondary | ICD-10-CM | POA: Diagnosis not present

## 2019-07-28 DIAGNOSIS — D509 Iron deficiency anemia, unspecified: Secondary | ICD-10-CM | POA: Insufficient documentation

## 2019-07-28 DIAGNOSIS — I480 Paroxysmal atrial fibrillation: Secondary | ICD-10-CM | POA: Diagnosis not present

## 2019-07-28 DIAGNOSIS — Z89611 Acquired absence of right leg above knee: Secondary | ICD-10-CM | POA: Diagnosis not present

## 2019-07-28 DIAGNOSIS — Z79899 Other long term (current) drug therapy: Secondary | ICD-10-CM | POA: Diagnosis not present

## 2019-07-28 LAB — CBC WITH DIFFERENTIAL (CANCER CENTER ONLY)
Abs Immature Granulocytes: 0.07 10*3/uL (ref 0.00–0.07)
Basophils Absolute: 0.1 10*3/uL (ref 0.0–0.1)
Basophils Relative: 1 %
Eosinophils Absolute: 0.3 10*3/uL (ref 0.0–0.5)
Eosinophils Relative: 2 %
HCT: 38.8 % (ref 36.0–46.0)
Hemoglobin: 11.2 g/dL — ABNORMAL LOW (ref 12.0–15.0)
Immature Granulocytes: 1 %
Lymphocytes Relative: 9 %
Lymphs Abs: 1.1 10*3/uL (ref 0.7–4.0)
MCH: 23.2 pg — ABNORMAL LOW (ref 26.0–34.0)
MCHC: 28.9 g/dL — ABNORMAL LOW (ref 30.0–36.0)
MCV: 80.5 fL (ref 80.0–100.0)
Monocytes Absolute: 0.3 10*3/uL (ref 0.1–1.0)
Monocytes Relative: 3 %
Neutro Abs: 10.2 10*3/uL — ABNORMAL HIGH (ref 1.7–7.7)
Neutrophils Relative %: 84 %
Platelet Count: 389 10*3/uL (ref 150–400)
RBC: 4.82 MIL/uL (ref 3.87–5.11)
RDW: 17.9 % — ABNORMAL HIGH (ref 11.5–15.5)
WBC Count: 12.1 10*3/uL — ABNORMAL HIGH (ref 4.0–10.5)
nRBC: 0 % (ref 0.0–0.2)

## 2019-07-28 NOTE — Telephone Encounter (Signed)
Scheduled per 08/07 los, cancelled august and October appointments per los. Patient received avs and calender.

## 2019-07-29 ENCOUNTER — Encounter: Payer: Self-pay | Admitting: Hematology

## 2019-07-31 ENCOUNTER — Encounter (HOSPITAL_BASED_OUTPATIENT_CLINIC_OR_DEPARTMENT_OTHER): Payer: Medicare Other | Attending: Internal Medicine

## 2019-07-31 ENCOUNTER — Other Ambulatory Visit: Payer: Self-pay

## 2019-07-31 DIAGNOSIS — I739 Peripheral vascular disease, unspecified: Secondary | ICD-10-CM | POA: Diagnosis not present

## 2019-07-31 DIAGNOSIS — L97822 Non-pressure chronic ulcer of other part of left lower leg with fat layer exposed: Secondary | ICD-10-CM | POA: Insufficient documentation

## 2019-08-01 ENCOUNTER — Ambulatory Visit: Payer: Medicare Other | Admitting: Physician Assistant

## 2019-08-11 ENCOUNTER — Other Ambulatory Visit (HOSPITAL_COMMUNITY): Payer: Self-pay | Admitting: Cardiovascular Disease

## 2019-08-11 ENCOUNTER — Other Ambulatory Visit: Payer: Self-pay

## 2019-08-11 ENCOUNTER — Ambulatory Visit (HOSPITAL_COMMUNITY)
Admission: RE | Admit: 2019-08-11 | Discharge: 2019-08-11 | Disposition: A | Discharge status: Home / Self Care | Payer: Medicare Other | Source: Ambulatory Visit | Attending: Cardiology | Admitting: Cardiology

## 2019-08-11 DIAGNOSIS — I6523 Occlusion and stenosis of bilateral carotid arteries: Secondary | ICD-10-CM

## 2019-08-11 DIAGNOSIS — I6529 Occlusion and stenosis of unspecified carotid artery: Secondary | ICD-10-CM | POA: Insufficient documentation

## 2019-08-11 DIAGNOSIS — I771 Stricture of artery: Secondary | ICD-10-CM

## 2019-08-14 ENCOUNTER — Ambulatory Visit: Payer: Medicare Other | Admitting: Physician Assistant

## 2019-08-14 ENCOUNTER — Other Ambulatory Visit: Payer: Self-pay | Admitting: *Deleted

## 2019-08-14 DIAGNOSIS — I6529 Occlusion and stenosis of unspecified carotid artery: Secondary | ICD-10-CM

## 2019-08-14 DIAGNOSIS — L97822 Non-pressure chronic ulcer of other part of left lower leg with fat layer exposed: Secondary | ICD-10-CM | POA: Diagnosis not present

## 2019-08-15 ENCOUNTER — Other Ambulatory Visit: Payer: Self-pay

## 2019-08-15 ENCOUNTER — Ambulatory Visit (INDEPENDENT_AMBULATORY_CARE_PROVIDER_SITE_OTHER): Payer: Medicare Other | Admitting: Cardiovascular Disease

## 2019-08-15 ENCOUNTER — Encounter: Payer: Self-pay | Admitting: Cardiovascular Disease

## 2019-08-15 DIAGNOSIS — I10 Essential (primary) hypertension: Secondary | ICD-10-CM

## 2019-08-15 DIAGNOSIS — I739 Peripheral vascular disease, unspecified: Secondary | ICD-10-CM

## 2019-08-15 DIAGNOSIS — I6523 Occlusion and stenosis of bilateral carotid arteries: Secondary | ICD-10-CM

## 2019-08-15 DIAGNOSIS — E782 Mixed hyperlipidemia: Secondary | ICD-10-CM | POA: Diagnosis not present

## 2019-08-15 DIAGNOSIS — I2510 Atherosclerotic heart disease of native coronary artery without angina pectoris: Secondary | ICD-10-CM

## 2019-08-15 DIAGNOSIS — I4891 Unspecified atrial fibrillation: Secondary | ICD-10-CM | POA: Diagnosis not present

## 2019-08-15 MED ORDER — DILTIAZEM HCL ER COATED BEADS 240 MG PO CP24: 240.0000 mg | ORAL_CAPSULE | Freq: Every morning | ORAL | 3 refills | Status: DC
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
## 2019-08-15 NOTE — Patient Instructions (Addendum)
Medication Instructions:  Your physician has recommended you make the following change in your medication:   [place] CD TO 240 MG BY MOUTH DAILY   If you need a refill on your cardiac medications before your next appointment, please call your pharmacy.   Lab work: Your physician recommends that you return for lab work today : lipid and liver panels  If you have labs (blood work) drawn today and your tests are completely normal, you will receive your results only by: Marland Kitchen MyChart Message (if you have MyChart) OR . A paper copy in the mail If you have any lab test that is abnormal or we need to change your treatment, we will call you to review the results.  Testing/Procedures: Your physician has requested that you have a lower or upper extremity arterial duplex. This test is an ultrasound of the arteries in the legs or arms. It looks at arterial blood flow in the legs and arms. Allow one hour for Lower and Upper Arterial scans. There are no restrictions or special instructions. TO BE SCHEDULED FOR 12 MONTHS  Your physician has requested that you have an ankle brachial index (ABI). During this test an ultrasound and blood pressure cuff are used to evaluate the arteries that supply the arms and legs with blood. Allow thirty minutes for this exam. There are no restrictions or special instructions. TO BE SCHEDULED FOR 12 MONTHS  Your physician has requested that you have a carotid duplex. This test is an ultrasound of the carotid arteries in your neck. It looks at blood flow through these arteries that supply the brain with blood. Allow one hour for this exam. There are no restrictions or special instructions. TO BE SCHEDULED FOR 12 MONTHS  Follow-Up: At [facility], you and your health needs are our priority.  As part of our continuing mission to provide you with exceptional heart care, we have created designated Provider Care Teams.  These Car e Teams include your primary  Cardiologist (physician) and Advanced Practice Providers (APPs -  Physician Assistants and Nurse Practitioners) who all work together to provide you with the care you need, when you need it. You will need a follow up appointment in 12 months with Dr. Quay Burow.  Please call our office 2 months in advance to schedule this/each appointment.     ADDITIONAL INFORMATION: KEEP A BLOOD PRESSURE LOG FOR 30 DAYS THEN FOLLOW UP WITH A CLINICAL PHARMACIST IN THE HYPERTENSION CLINIC. YOU WILL NEED AN APPOINTMENT.

## 2019-08-15 NOTE — Assessment & Plan Note (Signed)
>>  ASSESSMENT AND PLAN FOR MIXED HYPERLIPIDEMIA WRITTEN ON 08/15/2019 10:40 AM BY BERRY, JONATHAN J, MD  History of hyperlipidemia intolerant to statin therapy on Zetia  and fenofibrate .  We will recheck a lipid liver profile today

## 2019-08-15 NOTE — Assessment & Plan Note (Signed)
History of PAF maintaining sinus rhythm on Eliquis oral anticoagulation. 
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
## 2019-08-15 NOTE — Assessment & Plan Note (Signed)
History of noncritical CAD by cath performed by Dr. Ellyn Hack remotely.

## 2019-08-15 NOTE — Assessment & Plan Note (Signed)
Recent carotid Doppler showed moderately severe left and mild to moderate right ICA stenosis.  This will be be repeated on an annual basis.

## 2019-08-15 NOTE — Assessment & Plan Note (Signed)
History of hyperlipidemia intolerant to statin therapy on Zetia and fenofibrate.  We will recheck a lipid liver profile today

## 2019-08-15 NOTE — Assessment & Plan Note (Signed)
History of essential hypertension with blood pressure measured today at 184/72.  Having a lot of stress this morning at home.  She is on Cardizem CD 180 mg a day in addition to lisinopril 20 mg a day.  I am going to increase her Cardizem from 180 to 240 mg a day, have her keep a blood pressure log and will have Kristen call her in 4 weeks to review.

## 2019-08-16 LAB — HEPATIC FUNCTION PANEL
ALT: 20 IU/L (ref 0–32)
AST: 18 IU/L (ref 0–40)
Albumin: 4.1 g/dL (ref 3.8–4.8)
Alkaline Phosphatase: 62 IU/L (ref 39–117)
Bilirubin Total: 0.2 mg/dL (ref 0.0–1.2)
Bilirubin, Direct: 0.09 mg/dL (ref 0.00–0.40)
Total Protein: 6.8 g/dL (ref 6.0–8.5)

## 2019-08-16 LAB — LIPID PANEL
Chol/HDL Ratio: 3.6 ratio (ref 0.0–4.4)
Cholesterol, Total: 162 mg/dL (ref 100–199)
HDL: 45 mg/dL (ref >39)
LDL Calculated: 73 mg/dL (ref 0–99)
Triglycerides: 219 mg/dL — ABNORMAL HIGH (ref 0–149)
VLDL Cholesterol Cal: 44 mg/dL — ABNORMAL HIGH (ref 5–40)

## 2019-08-18 ENCOUNTER — Other Ambulatory Visit: Payer: Medicare Other

## 2019-08-30 ENCOUNTER — Other Ambulatory Visit: Payer: Self-pay | Admitting: *Deleted

## 2019-08-30 DIAGNOSIS — Z8673 Personal history of transient ischemic attack (TIA), and cerebral infarction without residual deficits: Secondary | ICD-10-CM

## 2019-08-30 DIAGNOSIS — I214 Non-ST elevation (NSTEMI) myocardial infarction: Secondary | ICD-10-CM

## 2019-08-30 MED ORDER — EZETIMIBE 10 MG PO TABS: 10.0000 mg | ORAL_TABLET | Freq: Every day | ORAL | 3 refills | Status: DC

## 2019-09-07 ENCOUNTER — Inpatient Hospital Stay: Payer: Medicare Other | Attending: Hematology

## 2019-09-07 ENCOUNTER — Other Ambulatory Visit: Payer: Self-pay

## 2019-09-07 DIAGNOSIS — D45 Polycythemia vera: Secondary | ICD-10-CM | POA: Insufficient documentation

## 2019-09-07 LAB — CBC WITH DIFFERENTIAL (CANCER CENTER ONLY)
Abs Immature Granulocytes: 0.07 10*3/uL (ref 0.00–0.07)
Basophils Absolute: 0.1 10*3/uL (ref 0.0–0.1)
Basophils Relative: 1 %
Eosinophils Absolute: 0.2 10*3/uL (ref 0.0–0.5)
Eosinophils Relative: 2 %
HCT: 41.2 % (ref 36.0–46.0)
Hemoglobin: 11.7 g/dL — ABNORMAL LOW (ref 12.0–15.0)
Immature Granulocytes: 1 %
Lymphocytes Relative: 9 %
Lymphs Abs: 0.8 10*3/uL (ref 0.7–4.0)
MCH: 22.6 pg — ABNORMAL LOW (ref 26.0–34.0)
MCHC: 28.4 g/dL — ABNORMAL LOW (ref 30.0–36.0)
MCV: 79.7 fL — ABNORMAL LOW (ref 80.0–100.0)
Monocytes Absolute: 0.2 10*3/uL (ref 0.1–1.0)
Monocytes Relative: 2 %
Neutro Abs: 8.2 10*3/uL — ABNORMAL HIGH (ref 1.7–7.7)
Neutrophils Relative %: 85 %
Platelet Count: 182 10*3/uL (ref 150–400)
RBC: 5.17 MIL/uL — ABNORMAL HIGH (ref 3.87–5.11)
RDW: 17.2 % — ABNORMAL HIGH (ref 11.5–15.5)
WBC Count: 9.6 10*3/uL (ref 4.0–10.5)
nRBC: 0 % (ref 0.0–0.2)

## 2019-09-07 LAB — FERRITIN: Ferritin: 12 ng/mL (ref 11–307)

## 2019-09-11 ENCOUNTER — Telehealth: Payer: Self-pay

## 2019-09-11 NOTE — Telephone Encounter (Signed)
-----   Message from Truitt Merle, MD sent at 09/09/2019  4:34 PM EDT ----- Please let pt know her lab results, she still has mild anemia and iron deficiency. I recommend reduce Hydrea from 4 days/weeo to 3 days a week (she can take MWF). Will repeat lab in 3 months as scheduled.   Thanks   Truitt Merle  09/09/2019

## 2019-09-11 NOTE — Telephone Encounter (Signed)
Left voicemail informing patient that per Dr. Burr Medico her recent lab results show she "still has mild anemia and iron deficiency mild anemia and iron deficiency" and that Dr. Burr Medico recommends decreasing her Hydrea regimen from 4 times daily to 3 times daily. Encouraged her to call the office back if she needed further clarification or had any questions, otherwise Dr. Burr Medico will plan to see her for her follow-up appointment in November.

## 2019-09-26 ENCOUNTER — Other Ambulatory Visit: Payer: Self-pay | Admitting: Physician Assistant

## 2019-09-26 ENCOUNTER — Other Ambulatory Visit: Payer: Self-pay

## 2019-09-26 ENCOUNTER — Ambulatory Visit (INDEPENDENT_AMBULATORY_CARE_PROVIDER_SITE_OTHER): Payer: Medicare Other | Admitting: Pharmacist

## 2019-09-26 VITALS — BP 184/86 | HR 70

## 2019-09-26 DIAGNOSIS — I10 Essential (primary) hypertension: Secondary | ICD-10-CM

## 2019-09-26 MED ORDER — VALSARTAN 80 MG PO TABS: 80.0000 mg | ORAL_TABLET | Freq: Two times a day (BID) | ORAL | 1 refills | Status: DC

## 2019-09-26 NOTE — Patient Instructions (Addendum)
Return for a  follow up appointment in 4 week  Go to the lab in 2 weeks   Check your blood pressure at home daily (if able) and keep record of the readings.  Take your BP meds as follows: *STOP taking lisinopril 20mg * *START taking Valsartan 80mg  twice daily*  Bring all of your meds, your BP cuff and your record of home blood pressures to your next appointment.  Exercise as you're able, try to walk approximately 30 minutes per day.  Keep salt intake to a minimum, especially watch canned and prepared boxed foods.  Eat more fresh fruits and vegetables and fewer canned items.  Avoid eating in fast food restaurants.    HOW TO TAKE YOUR BLOOD PRESSURE: . Rest 5 minutes before taking your blood pressure. .  Don't smoke or drink caffeinated beverages for at least 30 minutes before. . Take your blood pressure before (not after) you eat. . Sit comfortably with your back supported and both feet on the floor (don't cross your legs). . Elevate your arm to heart level on a table or a desk. . Use the proper sized cuff. It should fit smoothly and snugly around your bare upper arm. There should be enough room to slip a fingertip under the cuff. The bottom edge of the cuff should be 1 inch above the crease of the elbow. . Ideally, take 3 measurements at one sitting and record the average.

## 2019-09-26 NOTE — Progress Notes (Signed)
Patient ID: [name]                 DOB: [birth year]                      MRN: [id number]     HPI: [name] is a 68 y.o. female referred by Dr. [name]  to HTN clinic. PMI includes hypertension, PVD, Afib, carotid disease, migraine, stroke, STEMI, statin intolerance, and 23 documented drugs allergies/ intolerances.  She report increased frequency of headaches, increase stress in her life, and some dry cough. Denies dizziness, swelling, shortness or breath or blurry vision.  Current HTN meds:  Cardizem 240mg  daily Lisinopril 20mg  twice daily  Previously tried:  cardizem 180mg  daily  Allergic to: Atenolol - rash and itching Propranolol Hydralazine - swelling  BP goal: 130/80 or less  Social History: former smoker, denies alcohol use  Diet: low sodium ,low caffeine, and lot of water  Home BP readings:  13 readings: average 182/96 (HR 69-90bpm)  Wt Readings from Last 3 Encounters:  08/15/19 133 lb 3.2 oz (60.4 kg)  07/28/19 130 lb 4.8 oz (59.1 kg)  06/30/19 126 lb 1.6 oz (57.2 kg)   BP Readings from Last 3 Encounters:  09/26/19 (!) 184/86  08/15/19 (!) 172/70  07/28/19 132/61   Pulse Readings from Last 3 Encounters:  09/26/19 70  08/15/19 67  07/28/19 67     Past Medical History:  Diagnosis Date  . Arthritis    "fingers, some in my knees" (01/28/2016)  . CAD (coronary artery disease), per cath 04/27/13, non obstructive disease, EF 65-70% 05/11/2013  . Carotid disease, bilateral ([place]) 09/2013   Lt carotid 50-69% stentosis, less on rt  . Dysrhythmia   . Fatty liver   . GERD (gastroesophageal reflux disease)   . Glaucoma   . H/O cardiovascular stress test 12/27/2009   negative for ischemia  . Headache    "occasional since eye pressure regulated" (01/28/2016)  . Hyperlipidemia   . Hypertension   . Leukocytosis 07/15/2015  . Migraine    "stopped w/laser holes to relieve pressure in my eyes; had them 3-4 times/wk before taht" (01/28/2016)  . Mitral regurgitation,2+  by cath 05/11/2013  . NSTEMI (non-ST elevated myocardial infarction) ([place]) 04/27/2013  . Pain    BOTH KNEES - PT HAS TORN MENISCUS LEFT KNEE  . Paroxysmal atrial fibrillation ([facility])    hx of a fib on ASA  AND ELIQUIS   . Polycythemia vera ([place]) 08/20/2015   JAK-2 positive 07/19/15  . PVD (peripheral vascular disease) with claudication ([place]) 07/2006   previous L ext iliac stent and rt SFA stent, known occluded Lt SFA  . S/P cardiac cath 04/27/13   NON OBSTRUCTIVE DISEASE, 2+ mr, ef 65-70%  . Statin intolerance   . Stroke ([place]) 2006   SWELLING ALL OVER AND RT SIDE OF FACE DRAWN AND SPEECH SLURRED AND NUMBNESS ON RIGHT SIDE-- ALL RESOLVED    Current Outpatient Medications on File Prior to Visit  Medication Sig Dispense Refill  . acetaminophen (TYLENOL) 500 MG tablet Take 2 tablets (1,000 mg total) by mouth every 6 (six) hours. 30 tablet 0  . alendronate (FOSAMAX) 70 MG tablet Take 1 tablet (70 mg total) by mouth once a week. 4 tablet 11  . apixaban (ELIQUIS) 5 MG TABS tablet Take 1 tablet (5 mg total) by mouth 2 (two) times daily. 60 tablet 11  . ARTIFICIAL TEAR OP Place 1 drop into both eyes  as needed (for dry eyes).    Marland Kitchen aspirin EC 325 MG tablet Take 325 mg by mouth daily.    Marland Kitchen diltiazem (CARDIZEM CD) 240 MG 24 hr capsule Take 1 capsule (240 mg total) by mouth every morning. 90 capsule 3  . docusate sodium (COLACE) 100 MG capsule Take 200 mg by mouth every evening.     . ezetimibe (ZETIA) 10 MG tablet Take 1 tablet (10 mg total) by mouth daily. 90 tablet 3  . fenofibrate (TRICOR) 145 MG tablet Take 1 tablet (145 mg total) by mouth at bedtime. PLEASE CONTACT OFFICE FOR ADDITIONAL REFILLS 30 tablet 11  . flecainide (TAMBOCOR) 50 MG tablet Take 1 tablet (50 mg total) by mouth 2 (two) times daily. PLEASE CONTACT OFFICE FOR ADDITIONAL REFILLS 60 tablet 11  . hydroxyurea (HYDREA) 500 MG capsule TAKE 1 CAPSULE DAILY. MAY TAKE WITH FOOD TO MINIMIZE GI SIDE EFFECTS 30 capsule 11  . omeprazole (PRILOSEC)  20 MG capsule Take 1 capsule (20 mg total) by mouth 2 (two) times daily before a meal. 60 capsule 11  . traMADol (ULTRAM) 50 MG tablet Take 50 mg by mouth every 8 (eight) hours as needed.     No current facility-administered medications on file prior to visit.     Allergies  Allergen Reactions  . Atenolol Rash and Itching  . Demerol  [Meperidine Hcl] Itching  . Demerol [Meperidine] Other (See Comments) and Itching    Unknown, can't remember  Unknown, can't remember   . Gabapentin Anxiety and Other (See Comments)    SI and HI  . Inderal [Propranolol] Other (See Comments) and Itching    Doesn't remember   . Prednisone Other (See Comments)    Muscle spasms  . Statins Itching and Other (See Comments)    Simvastatin-caused severe itching Pravastatin-caused lesser tiching One type of statin? Caused stroke in 2006, and other symptoms as result  . Warfarin And Related Other (See Comments)    Caused nose bleeds  . Crestor [Rosuvastatin] Itching and Rash  . Docosahexaenoic Acid (Dha)     Other reaction(s): Other (See Comments) nosebleeds  . Lactose Intolerance (Gi) Other (See Comments)    Bloating, gas  . Lipitor [Atorvastatin] Rash  . Lovaza [Omega-3-Acid Ethyl Esters] Other (See Comments)    nosebleeds   . Penicillins Hives, Rash and Other (See Comments)    Has patient had a PCN reaction causing immediate rash, facial/tongue/throat swelling, SOB or lightheadedness with hypotension: Yes Has patient had a PCN reaction causing severe rash involving mucus membranes or skin necrosis: No Has patient had a PCN reaction that required hospitalization: No Has patient had a PCN reaction occurring within the last 10 years: No If all of the above answers are "NO", then may proceed with Cephalosporin use.  Questionable high fever   . Pravastatin Itching and Rash  . Simvastatin Itching, Rash and Other (See Comments)  . Trilipix [Choline Fenofibrate] Other (See Comments)    Doesn't remember    . Warfarin Other (See Comments)    nosebleeds  . Hydralazine Swelling  . Effexor [Venlafaxine Hcl] Other (See Comments)    Doesn't remember   . Nexium [Esomeprazole Magnesium] Rash  . Percocet [Oxycodone-Acetaminophen] Other (See Comments)    Doesn't remember   . Plavix [Clopidogrel Bisulfate] Rash    Blood pressure (!) 184/86, pulse 70, SpO2 98 %.  Essential hypertension Blood pressure remains extremely elevated above goal of <130/80. Patient reports increased stress but compliance with medication and  sodium  restriction. Due to 23 documented drug allergies, I will try changing only 1 medication at a time.   Will d/c lisinopril ans start valsartan 80mg  twice daily and repeat BMET in 2 weeks. Plan to add chlorthalidone to therapy during next office visit in 4 weeks, if patient tolerating valsartan without complications.    Kenai Fluegel Rodriguez-Guzman PharmD, BCPS, [place] [address] [place] [address] 09/27/2019 6:16 PM

## 2019-09-27 ENCOUNTER — Encounter: Payer: Self-pay | Admitting: Pharmacist

## 2019-09-27 NOTE — Assessment & Plan Note (Signed)
Blood pressure remains extremely elevated above goal of <130/80. Patient reports increased stress but compliance with medication and  sodium restriction. Due to 23 documented drug allergies, I will try changing only 1 medication at a time.   Will d/c lisinopril ans start valsartan 80mg  twice daily and repeat BMET in 2 weeks. Plan to add chlorthalidone to therapy during next office visit in 4 weeks, if patient tolerating valsartan without complications.

## 2019-09-29 ENCOUNTER — Ambulatory Visit: Payer: Medicare Other | Admitting: Hematology

## 2019-09-29 ENCOUNTER — Other Ambulatory Visit: Payer: Medicare Other

## 2019-10-10 LAB — BASIC METABOLIC PANEL
BUN/Creatinine Ratio: 28 (ref 12–28)
BUN: 23 mg/dL (ref 8–27)
CO2: 17 mmol/L — ABNORMAL LOW (ref 20–29)
Calcium: 8.8 mg/dL (ref 8.7–10.3)
Chloride: 103 mmol/L (ref 96–106)
Creatinine, Ser: 0.81 mg/dL (ref 0.57–1.00)
GFR calc Af Amer: 86 mL/min/{1.73_m2} (ref >59)
GFR calc non Af Amer: 75 mL/min/{1.73_m2} (ref >59)
Glucose: 113 mg/dL — ABNORMAL HIGH (ref 65–99)
Potassium: 4.6 mmol/L (ref 3.5–5.2)
Sodium: 138 mmol/L (ref 134–144)

## 2019-10-26 ENCOUNTER — Ambulatory Visit (INDEPENDENT_AMBULATORY_CARE_PROVIDER_SITE_OTHER): Payer: Medicare Other | Admitting: Pharmacist Clinician (PhC)/ Clinical Pharmacy Specialist

## 2019-10-26 ENCOUNTER — Other Ambulatory Visit: Payer: Self-pay

## 2019-10-26 DIAGNOSIS — I10 Essential (primary) hypertension: Secondary | ICD-10-CM

## 2019-10-26 MED ORDER — CHLORTHALIDONE 25 MG PO TABS: 25.0000 mg | ORAL_TABLET | Freq: Every day | ORAL | 1 refills | Status: DC

## 2019-10-26 NOTE — Patient Instructions (Signed)
Return for a a follow up appointment and labs on November 24  Your blood pressure today is 168/60  Check your blood pressure at home daily and keep record of the readings.  Take your BP meds as follows:  Add chlorthalidone 25 mg once daily in the mornings.  Continue with all other medications  Bring all of your meds, your BP cuff and your record of home blood pressures to your next appointment.  Exercise as you're able, try to walk approximately 30 minutes per day.  Keep salt intake to a minimum, especially watch canned and prepared boxed foods.  Eat more fresh fruits and vegetables and fewer canned items.  Avoid eating in fast food restaurants.    HOW TO TAKE YOUR BLOOD PRESSURE: . Rest 5 minutes before taking your blood pressure. .  Don't smoke or drink caffeinated beverages for at least 30 minutes before. . Take your blood pressure before (not after) you eat. . Sit comfortably with your back supported and both feet on the floor (don't cross your legs). . Elevate your arm to heart level on a table or a desk. . Use the proper sized cuff. It should fit smoothly and snugly around your bare upper arm. There should be enough room to slip a fingertip under the cuff. The bottom edge of the cuff should be 1 inch above the crease of the elbow. . Ideally, take 3 measurements at one sitting and record the average.

## 2019-10-26 NOTE — Progress Notes (Signed)
Patient ID: [name]                 DOB: 06-28-1951                      MRN: [id number]     HPI: [name] is a 68 y.o. female referred by Dr. [name]  to HTN clinic. PMI includes hypertension, PVD (right BKA in 2018 d/t graft thrombosis), Afib, carotid disease, migraine, stroke, STEMI, statin intolerance, and 23 documented drugs allergies/ intolerances.  She was seen by [name] last month and found to have a blood pressure of 184/86.  At that time she was on diltiazem 240 mg and lisinopril 20 mg.  Because of her multiple drug sensitivities, it was decide to make medication changes slowly, so the lisinopril was converted to valsartan 80 mg.  Today she returns for follow up.  She states has been feeling well, although stress is still a big concern. Her daughter and 2 grandchildren are homeless, living in a trailer.  The children (10 and 14) are at her house daily for online schooling.  She continues to have problems with arthritis in her knee and recently received an injection (steroid?) that has lessened the pain significantly.     Current HTN meds:  Cardizem 240mg  daily Valsartan 80 mg bid  Previously tried:  cardizem 180mg  daily, lisinopril 5-20 mg  Allergic to: Atenolol - rash and itching Propranolol Hydralazine - swelling  BP goal: 130/80 or less  Social History: former smoker (quit 6-7 years ago), denies alcohol use, Mt Dew daily, tries to stop after 2 pm, [employer] Iced Coffee  Family History: Father first MI when he was 29, had CABG x 3, hyperlipidemia, unsure of age at death; mother died at 87 heart issues, 2 bouts cancer (uterine, lung), MI; 3 brothers all with heart disease, 2 deceased (1 cancer/heart, 1 multiple MI's), 1 living Younger daughter with hypertension  Diet: low sodium, and lot of water (as ice), trying to avoid Lays and Fritos due to salt - currently using Lays Baked chips; adds little salt with cooking, veggies daily (all f/f/c), canned  fruits; tries to avoid ham/pork (will eat rare bacon)  Home BP readings:  11 morning readings average 176/100 with 12 evening readings at 161/99.  Diastolic essentially unchanged with systolic showing a drop of about 6 points with switch from lisinopril to valsartan.   Wt Readings from Last 3 Encounters:  08/15/19 133 lb 3.2 oz (60.4 kg)  07/28/19 130 lb 4.8 oz (59.1 kg)  06/30/19 126 lb 1.6 oz (57.2 kg)   BP Readings from Last 3 Encounters:  10/26/19 (!) 168/60  09/26/19 (!) 184/86  08/15/19 (!) 172/70   Pulse Readings from Last 3 Encounters:  10/26/19 72  09/26/19 70  08/15/19 67     Past Medical History:  Diagnosis Date  . Arthritis    "fingers, some in my knees" (01/28/2016)  . CAD (coronary artery disease), per cath 04/27/13, non obstructive disease, EF 65-70% 05/11/2013  . Carotid disease, bilateral ([place]) 09/2013   Lt carotid 50-69% stentosis, less on rt  . Dysrhythmia   . Fatty liver   . GERD (gastroesophageal reflux disease)   . Glaucoma   . H/O cardiovascular stress test 12/27/2009   negative for ischemia  . Headache    "occasional since eye pressure regulated" (01/28/2016)  . Hyperlipidemia   . Hypertension   . Leukocytosis 07/15/2015  . Migraine    "stopped  w/laser holes to relieve pressure in my eyes; had them 3-4 times/wk before taht" (01/28/2016)  . Mitral regurgitation,2+ by cath 05/11/2013  . NSTEMI (non-ST elevated myocardial infarction) ([place] [place]) 04/27/2013  . Pain    BOTH KNEES - PT HAS TORN MENISCUS LEFT KNEE  . Paroxysmal atrial fibrillation ([facility])    hx of a fib on ASA  AND ELIQUIS   . Polycythemia vera ([place]) 08/20/2015   JAK-2 positive 07/19/15  . PVD (peripheral vascular disease) with claudication ([place]) 07/2006   previous L ext iliac stent and rt SFA stent, known occluded Lt SFA  . S/P cardiac cath 04/27/13   NON OBSTRUCTIVE DISEASE, 2+ mr, ef 65-70%  . Statin intolerance   . Stroke ([place]) 2006   SWELLING ALL OVER AND RT SIDE OF FACE DRAWN AND SPEECH SLURRED AND  NUMBNESS ON RIGHT SIDE-- ALL RESOLVED    Current Outpatient Medications on File Prior to Visit  Medication Sig Dispense Refill  . acetaminophen (TYLENOL) 500 MG tablet Take 2 tablets (1,000 mg total) by mouth every 6 (six) hours. 30 tablet 0  . alendronate (FOSAMAX) 70 MG tablet Take 1 tablet (70 mg total) by mouth once a week. 4 tablet 11  . apixaban (ELIQUIS) 5 MG TABS tablet Take 1 tablet (5 mg total) by mouth 2 (two) times daily. 60 tablet 11  . ARTIFICIAL TEAR OP Place 1 drop into both eyes as needed (for dry eyes).    Marland Kitchen aspirin EC 325 MG tablet Take 325 mg by mouth daily.    Marland Kitchen diltiazem (CARDIZEM CD) 240 MG 24 hr capsule Take 1 capsule (240 mg total) by mouth every morning. 90 capsule 3  . docusate sodium (COLACE) 100 MG capsule Take 200 mg by mouth every evening.     . ezetimibe (ZETIA) 10 MG tablet Take 1 tablet (10 mg total) by mouth daily. 90 tablet 3  . fenofibrate (TRICOR) 145 MG tablet Take 1 tablet (145 mg total) by mouth at bedtime. PLEASE CONTACT OFFICE FOR ADDITIONAL REFILLS 30 tablet 11  . flecainide (TAMBOCOR) 50 MG tablet Take 1 tablet (50 mg total) by mouth 2 (two) times daily. PLEASE CONTACT OFFICE FOR ADDITIONAL REFILLS 60 tablet 11  . hydroxyurea (HYDREA) 500 MG capsule TAKE 1 CAPSULE DAILY. MAY TAKE WITH FOOD TO MINIMIZE GI SIDE EFFECTS 30 capsule 11  . traMADol (ULTRAM) 50 MG tablet Take 50 mg by mouth every 8 (eight) hours as needed.    . valsartan (DIOVAN) 80 MG tablet Take 1 tablet (80 mg total) by mouth 2 (two) times daily. TO REPLACE LISINOPRIL 60 tablet 1  . omeprazole (PRILOSEC) 20 MG capsule Take 1 capsule (20 mg total) by mouth 2 (two) times daily before a meal. 60 capsule 11   No current facility-administered medications on file prior to visit.     Allergies  Allergen Reactions  . Atenolol Rash and Itching  . Demerol  [Meperidine Hcl] Itching  . Demerol [Meperidine] Other (See Comments) and Itching    Unknown, can't remember  Unknown, can't remember    . Gabapentin Anxiety and Other (See Comments)    SI and HI  . Inderal [Propranolol] Other (See Comments) and Itching    Doesn't remember   . Prednisone Other (See Comments)    Muscle spasms  . Statins Itching and Other (See Comments)    Simvastatin-caused severe itching Pravastatin-caused lesser tiching One type of statin? Caused stroke in 2006, and other symptoms as result  . Warfarin And Related Other (See  Comments)    Caused nose bleeds  . Crestor [Rosuvastatin] Itching and Rash  . Docosahexaenoic Acid (Dha)     Other reaction(s): Other (See Comments) nosebleeds  . Lactose Intolerance (Gi) Other (See Comments)    Bloating, gas  . Lipitor [Atorvastatin] Rash  . Lovaza [Omega-3-Acid Ethyl Esters] Other (See Comments)    nosebleeds   . Penicillins Hives, Rash and Other (See Comments)    Has patient had a PCN reaction causing immediate rash, facial/tongue/throat swelling, SOB or lightheadedness with hypotension: Yes Has patient had a PCN reaction causing severe rash involving mucus membranes or skin necrosis: No Has patient had a PCN reaction that required hospitalization: No Has patient had a PCN reaction occurring within the last 10 years: No If all of the above answers are "NO", then may proceed with Cephalosporin use.  Questionable high fever   . Pravastatin Itching and Rash  . Simvastatin Itching, Rash and Other (See Comments)  . Trilipix [Choline Fenofibrate] Other (See Comments)    Doesn't remember   . Warfarin Other (See Comments)    nosebleeds  . Hydralazine Swelling  . Effexor [Venlafaxine Hcl] Other (See Comments)    Doesn't remember   . Nexium [Esomeprazole Magnesium] Rash  . Percocet [Oxycodone-Acetaminophen] Other (See Comments)    Doesn't remember   . Plavix [Clopidogrel Bisulfate] Rash    Blood pressure (!) 168/60, pulse 72, resp. rate 15, height 5' 5.5" (1.664 m), SpO2 97 %.  Essential hypertension Patient with poorly controlled blood pressure  currently just on valsartan and diltiazem.  Will add chlorthalidone 25 mg once daily in the mornings and have her return in 2 weeks for follow up visit and metabolic panel.  She will continue with home BP monitoring and was encouraged to continue with sodium and caffeine restrictions.  If she is able to take the chlorthalidone, would probably start increasing dose of valsartan at her next visit, until BP adequately controlled.    [name] PharmD CPP [facility]  [address] [place], [place] [address] [phone]
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
## 2019-10-29 NOTE — Assessment & Plan Note (Signed)
Patient with poorly controlled blood pressure currently just on valsartan and diltiazem.  Will add chlorthalidone 25 mg once daily in the mornings and have her return in 2 weeks for follow up visit and metabolic panel.  She will continue with home BP monitoring and was encouraged to continue with sodium and caffeine restrictions.  If she is able to take the chlorthalidone, would probably start increasing dose of valsartan at her next visit, until BP adequately controlled.

## 2019-11-01 ENCOUNTER — Other Ambulatory Visit: Payer: Self-pay

## 2019-11-01 ENCOUNTER — Inpatient Hospital Stay: Payer: Medicare Other | Attending: Hematology

## 2019-11-01 ENCOUNTER — Inpatient Hospital Stay (HOSPITAL_BASED_OUTPATIENT_CLINIC_OR_DEPARTMENT_OTHER): Payer: Medicare Other | Admitting: Hematology

## 2019-11-01 ENCOUNTER — Encounter: Payer: Self-pay | Admitting: Hematology

## 2019-11-01 VITALS — BP 153/73 | HR 66 | Temp 98.5°F | Resp 17 | Ht 65.5 in | Wt 135.2 lb

## 2019-11-01 DIAGNOSIS — I252 Old myocardial infarction: Secondary | ICD-10-CM | POA: Insufficient documentation

## 2019-11-01 DIAGNOSIS — Z7901 Long term (current) use of anticoagulants: Secondary | ICD-10-CM | POA: Insufficient documentation

## 2019-11-01 DIAGNOSIS — Z86718 Personal history of other venous thrombosis and embolism: Secondary | ICD-10-CM | POA: Insufficient documentation

## 2019-11-01 DIAGNOSIS — D45 Polycythemia vera: Secondary | ICD-10-CM

## 2019-11-01 DIAGNOSIS — D509 Iron deficiency anemia, unspecified: Secondary | ICD-10-CM | POA: Diagnosis not present

## 2019-11-01 DIAGNOSIS — Z7982 Long term (current) use of aspirin: Secondary | ICD-10-CM | POA: Insufficient documentation

## 2019-11-01 DIAGNOSIS — I480 Paroxysmal atrial fibrillation: Secondary | ICD-10-CM | POA: Insufficient documentation

## 2019-11-01 DIAGNOSIS — Z8673 Personal history of transient ischemic attack (TIA), and cerebral infarction without residual deficits: Secondary | ICD-10-CM | POA: Insufficient documentation

## 2019-11-01 DIAGNOSIS — I10 Essential (primary) hypertension: Secondary | ICD-10-CM | POA: Diagnosis not present

## 2019-11-01 DIAGNOSIS — Z79899 Other long term (current) drug therapy: Secondary | ICD-10-CM | POA: Diagnosis not present

## 2019-11-01 DIAGNOSIS — D696 Thrombocytopenia, unspecified: Secondary | ICD-10-CM | POA: Diagnosis not present

## 2019-11-01 LAB — CBC WITH DIFFERENTIAL (CANCER CENTER ONLY)
Abs Immature Granulocytes: 0.22 10*3/uL — ABNORMAL HIGH (ref 0.00–0.07)
Basophils Absolute: 0.2 10*3/uL — ABNORMAL HIGH (ref 0.0–0.1)
Basophils Relative: 1 %
Eosinophils Absolute: 0.4 10*3/uL (ref 0.0–0.5)
Eosinophils Relative: 2 %
HCT: 46.7 % — ABNORMAL HIGH (ref 36.0–46.0)
Hemoglobin: 13.9 g/dL (ref 12.0–15.0)
Immature Granulocytes: 1 %
Lymphocytes Relative: 10 %
Lymphs Abs: 1.6 10*3/uL (ref 0.7–4.0)
MCH: 22.8 pg — ABNORMAL LOW (ref 26.0–34.0)
MCHC: 29.8 g/dL — ABNORMAL LOW (ref 30.0–36.0)
MCV: 76.6 fL — ABNORMAL LOW (ref 80.0–100.0)
Monocytes Absolute: 0.5 10*3/uL (ref 0.1–1.0)
Monocytes Relative: 3 %
Neutro Abs: 13.3 10*3/uL — ABNORMAL HIGH (ref 1.7–7.7)
Neutrophils Relative %: 83 %
Platelet Count: 369 10*3/uL (ref 150–400)
RBC: 6.1 MIL/uL — ABNORMAL HIGH (ref 3.87–5.11)
RDW: 20.8 % — ABNORMAL HIGH (ref 11.5–15.5)
WBC Count: 16.1 10*3/uL — ABNORMAL HIGH (ref 4.0–10.5)
nRBC: 0 % (ref 0.0–0.2)

## 2019-11-02 ENCOUNTER — Telehealth: Payer: Self-pay | Admitting: Hematology

## 2019-11-02 NOTE — Telephone Encounter (Signed)
Scheduled appt per 11/11 los. ° °Spoke with pt and she is aware of her appt date and time. °

## 2019-11-14 ENCOUNTER — Encounter: Payer: Self-pay | Admitting: Pharmacist Clinician (PhC)/ Clinical Pharmacy Specialist

## 2019-11-14 ENCOUNTER — Ambulatory Visit (INDEPENDENT_AMBULATORY_CARE_PROVIDER_SITE_OTHER): Payer: Medicare Other | Admitting: Pharmacist Clinician (PhC)/ Clinical Pharmacy Specialist

## 2019-11-14 ENCOUNTER — Other Ambulatory Visit: Payer: Self-pay

## 2019-11-14 VITALS — BP 168/75 | HR 72

## 2019-11-14 DIAGNOSIS — I10 Essential (primary) hypertension: Secondary | ICD-10-CM

## 2019-11-14 MED ORDER — VALSARTAN 160 MG PO TABS: 160.0000 mg | ORAL_TABLET | Freq: Two times a day (BID) | ORAL | 6 refills | Status: DC

## 2019-11-14 NOTE — Progress Notes (Signed)
S: CC: Uncontrolled hypertension HPI: [name] is a 68 YO female referred by Dr. [name] to HTN clinic. PMI includes hypertension, PVD (right BKA in 2018 d/t graft thrombosis), Afib, carotid disease, migraine, stroke, STEMI, statin intolerance and 23 documented drug allergies/intolerances. When first seen at the clinic she was on diltiazem 240mg  and lisinopril 20mg . At her initial appointment it was decided to make medication changes slowly d/t her multiple drug sensitivities. [name] converted her lisinopril to valsartan 80mg  at her initial appointment. At her last appointment, with [name], her hypertension was still poorly controlled and the decision to add chlorthalidone 25mg  once in the mornings was made.  Today she presents for follow up. She states that she is doing well and is looking forward to potentially purchasing a house in the new year. She has trouble finding time to relax for a few minutes before taking her BP due to having her 2 grandchildren (45 and 62) living in her apartment. She states that sharing her apartment with her 2 grandchildren and her daughter are a big stressor for her. She is attempting to reduce her daily caffeine intake by drinking Sprite Zero instead of [facility]. She recently bought an arm blood pressure cuff and plans on beginning to use that (instead of her wrist cuff) for her daily readings. Currently she relies on county transportation to appointments and has issues with timing.  Her BP today was 168/75 w/ a HR of 72  FH: Fathers first MI when he was 6, had CABG x3, hyperlipidemia, unsure of age at death; mother died at 45 w/ hx of heart issues, 2 bouts of cancer (uterine and lung) and MI; 3 bothers all with heart disease, 2 deceased (1 cancer/heart, 1 multiple Mis), 1 living younger daughter with hypertension SH: former smoker (quit 6-7 years ago), denies alcohol use, Mt Dew daily (2-3) and periodic Starbucks Iced coffees, tries to  stop caffeine after 2pm. Diet: low sodium and a lot of water (as ice), trying to avoid Lays and Fritos due to salt - currently using Lays Baked chips; adds a little salt when cooking, eats veggies daily (all f/f/c), canned fruits; tries to avoid ham/pork (will sometimes eat bacon) Home BP Readings averages: AM: 149/80 (15 readings) PM: 146/84 (19 readings) Diastolic unchanged with a drop in systolic of about 20 points with the addition of chlorthalidone  O: PMH: Afib Paroxysmal Afib w/ RVR; Eliquis, ASA and flecainide  ASCVD Hx of CVA 04/2013, CAD, NSTEMI 04/2013; ezetimibe and fenofibrate  HTN Last BP 168/60 on 10/26/2019; Diltiazem, valsartan and chlorthalidone  PAD/PVD S/p right BKA d/t femoropopliteal bypass graft thrombosis in 2018   Meds: Current HTN meds: Cardizem 240mg  once daily Valsartan 80mg  BID Chlorthalidone 25mg  once daily in the morning  Previously tried HTN meds: Cardizem 180mg  once daily Lisinopril 5-20mg   BP Goal: less than 130/80 ALL: multiple allergies/intolerances, pertinent include atenolol (rash and itching), propranolol, hydralazine (swelling) VS: Wt Readings from Last 3 Encounters: 08/15/19  133 lb 3.2 oz (60.4 kg) 07/28/19  130 lb 4.8 oz (59.1 kg) 06/30/19  126 lb 1.6 oz (57.2 kg)  BP Readings from Last 3 Encounters: 10/26/19  168/60 09/26/19  184/86 08/15/19  172/70 Pulse Readings from Last 3 Encounters: 10/26/19  72 09/26/19  70 08/15/19  67  A: 1. Uncontrolled hypertension, currently on valsartan 80mg  BID, diltiazem 240mg  daily and chlorthalidone 25mg  in the morning. P: 1. Will increase valsartan to 160mg  PO BID and keep all other medications the same.  Will continue to record her home BP values. Discussed continuing to limit/avoid caffeinated drinks and limit salt intake. Plan to follow-up in 6 weeks due to holidays; will recheck BMP at follow-up to assess kidney function.  Orbie Pyo, PharmD Candidate,  [employer] Class of  2021  I was with student and patient for entire visit and agree with the above assessment  [name] PharmD CPP [place] at [facility]

## 2019-11-14 NOTE — Patient Instructions (Signed)
Return for a a follow up appointment January 7  Go to the lab today to check kidney function  Your blood pressure today is 168/76  Check your blood pressure at home daily and keep record of the readings.  Take your BP meds as follows:  Increase valsartan to 160 mg twice daily (take 2 of the 80 mg tabs twice daily until gone - then start the new tablets)  No changes to other medication  Bring all of your meds, your BP cuff and your record of home blood pressures to your next appointment.  Exercise as you're able, try to walk approximately 30 minutes per day.  Keep salt intake to a minimum, especially watch canned and prepared boxed foods.  Eat more fresh fruits and vegetables and fewer canned items.  Avoid eating in fast food restaurants.    HOW TO TAKE YOUR BLOOD PRESSURE: . Rest 5 minutes before taking your blood pressure. .  Don't smoke or drink caffeinated beverages for at least 30 minutes before. . Take your blood pressure before (not after) you eat. . Sit comfortably with your back supported and both feet on the floor (don't cross your legs). . Elevate your arm to heart level on a table or a desk. . Use the proper sized cuff. It should fit smoothly and snugly around your bare upper arm. There should be enough room to slip a fingertip under the cuff. The bottom edge of the cuff should be 1 inch above the crease of the elbow. . Ideally, take 3 measurements at one sitting and record the average.

## 2019-11-15 ENCOUNTER — Other Ambulatory Visit: Payer: Self-pay | Admitting: *Deleted

## 2019-11-15 DIAGNOSIS — I10 Essential (primary) hypertension: Secondary | ICD-10-CM

## 2019-11-15 LAB — BASIC METABOLIC PANEL
BUN/Creatinine Ratio: 26 (ref 12–28)
BUN: 24 mg/dL (ref 8–27)
CO2: 23 mmol/L (ref 20–29)
Calcium: 9.4 mg/dL (ref 8.7–10.3)
Chloride: 101 mmol/L (ref 96–106)
Creatinine, Ser: 0.91 mg/dL (ref 0.57–1.00)
GFR calc Af Amer: 75 mL/min/{1.73_m2} (ref >59)
GFR calc non Af Amer: 65 mL/min/{1.73_m2} (ref >59)
Glucose: 69 mg/dL (ref 65–99)
Potassium: 5.7 mmol/L — ABNORMAL HIGH (ref 3.5–5.2)
Sodium: 139 mmol/L (ref 134–144)

## 2019-11-24 ENCOUNTER — Other Ambulatory Visit: Payer: Self-pay | Admitting: Cardiovascular Disease

## 2019-11-24 ENCOUNTER — Other Ambulatory Visit: Payer: Self-pay

## 2019-11-24 DIAGNOSIS — I10 Essential (primary) hypertension: Secondary | ICD-10-CM

## 2019-11-24 LAB — BASIC METABOLIC PANEL
BUN/Creatinine Ratio: 23 (ref 12–28)
BUN: 21 mg/dL (ref 8–27)
CO2: 18 mmol/L — ABNORMAL LOW (ref 20–29)
Calcium: 9 mg/dL (ref 8.7–10.3)
Chloride: 101 mmol/L (ref 96–106)
Creatinine, Ser: 0.93 mg/dL (ref 0.57–1.00)
GFR calc Af Amer: 73 mL/min/{1.73_m2} (ref >59)
GFR calc non Af Amer: 63 mL/min/{1.73_m2} (ref >59)
Glucose: 89 mg/dL (ref 65–99)
Potassium: 4.8 mmol/L (ref 3.5–5.2)
Sodium: 136 mmol/L (ref 134–144)

## 2019-12-13 ENCOUNTER — Inpatient Hospital Stay: Payer: Medicare Other | Attending: Hematology

## 2019-12-13 ENCOUNTER — Other Ambulatory Visit: Payer: Self-pay

## 2019-12-13 DIAGNOSIS — D45 Polycythemia vera: Secondary | ICD-10-CM

## 2019-12-13 LAB — CBC WITH DIFFERENTIAL (CANCER CENTER ONLY)
Abs Immature Granulocytes: 0.08 10*3/uL — ABNORMAL HIGH (ref 0.00–0.07)
Basophils Absolute: 0.2 10*3/uL — ABNORMAL HIGH (ref 0.0–0.1)
Basophils Relative: 2 %
Eosinophils Absolute: 0.3 10*3/uL (ref 0.0–0.5)
Eosinophils Relative: 2 %
HCT: 47.3 % — ABNORMAL HIGH (ref 36.0–46.0)
Hemoglobin: 13.7 g/dL (ref 12.0–15.0)
Immature Granulocytes: 1 %
Lymphocytes Relative: 9 %
Lymphs Abs: 1.1 10*3/uL (ref 0.7–4.0)
MCH: 22.2 pg — ABNORMAL LOW (ref 26.0–34.0)
MCHC: 29 g/dL — ABNORMAL LOW (ref 30.0–36.0)
MCV: 76.7 fL — ABNORMAL LOW (ref 80.0–100.0)
Monocytes Absolute: 0.3 10*3/uL (ref 0.1–1.0)
Monocytes Relative: 3 %
Neutro Abs: 10.5 10*3/uL — ABNORMAL HIGH (ref 1.7–7.7)
Neutrophils Relative %: 83 %
Platelet Count: 379 10*3/uL (ref 150–400)
RBC: 6.17 MIL/uL — ABNORMAL HIGH (ref 3.87–5.11)
RDW: 21.2 % — ABNORMAL HIGH (ref 11.5–15.5)
WBC Count: 12.4 10*3/uL — ABNORMAL HIGH (ref 4.0–10.5)
nRBC: 0 % (ref 0.0–0.2)

## 2019-12-25 ENCOUNTER — Other Ambulatory Visit: Payer: Self-pay | Admitting: Cardiovascular Disease

## 2019-12-25 ENCOUNTER — Telehealth: Payer: Self-pay | Admitting: Hematology

## 2019-12-25 ENCOUNTER — Telehealth: Payer: Self-pay

## 2019-12-25 NOTE — Telephone Encounter (Signed)
Left voice message on patient identified voice mail box, per Dr. Burr Medico CBC results shows she needs phlebotomy this week, also increase Hydrea to four days a week MWFSun, MY chart message was also sent to the patient.

## 2019-12-25 NOTE — Telephone Encounter (Signed)
-----   Message from Truitt Merle, MD sent at 12/22/2019  9:44 AM EST ----- Please let pt know her CBC result, and schedule phlebotomy next week. I recommend her to increase hydrea from 1 tab daily MWF to 1 tab daily 4 days a week (MWFSun), thanks   Truitt Merle  12/22/2019

## 2019-12-25 NOTE — Telephone Encounter (Signed)
Scheduled appt per 1/4 sch message - pt unable to come in this week due to [facility] transportation . appts scheduled at pt next available time she can come in . Transportation needs a wk notice.   Pt aware of appt date and time

## 2019-12-28 ENCOUNTER — Other Ambulatory Visit: Payer: Self-pay

## 2019-12-28 ENCOUNTER — Ambulatory Visit (INDEPENDENT_AMBULATORY_CARE_PROVIDER_SITE_OTHER): Payer: Medicare Other | Admitting: Pharmacist

## 2019-12-28 VITALS — BP 130/86 | HR 79 | Resp 14 | Ht 65.5 in

## 2019-12-28 DIAGNOSIS — I10 Essential (primary) hypertension: Secondary | ICD-10-CM | POA: Diagnosis not present

## 2019-12-28 NOTE — Patient Instructions (Addendum)
Return for a  follow up appointment AS NEEDED   Check your blood pressure at home daily (if able) and keep record of the readings.  Take your BP meds as follows: *NO MEDICATION CHANGE*  Bring all of your meds, your BP cuff and your record of home blood pressures to your next appointment.  Exercise as you're able, try to walk approximately 30 minutes per day.  Keep salt intake to a minimum, especially watch canned and prepared boxed foods.  Eat more fresh fruits and vegetables and fewer canned items.  Avoid eating in fast food restaurants.    HOW TO TAKE YOUR BLOOD PRESSURE: . Rest 5 minutes before taking your blood pressure. .  Don't smoke or drink caffeinated beverages for at least 30 minutes before. . Take your blood pressure before (not after) you eat. . Sit comfortably with your back supported and both feet on the floor (don't cross your legs). . Elevate your arm to heart level on a table or a desk. . Use the proper sized cuff. It should fit smoothly and snugly around your bare upper arm. There should be enough room to slip a fingertip under the cuff. The bottom edge of the cuff should be 1 inch above the crease of the elbow. . Ideally, take 3 measurements at one sitting and record the average.

## 2019-12-28 NOTE — Progress Notes (Signed)
Patient ID: [name]                 DOB: 1951/01/11                      MRN: [id number]     HPI: [name] is a 69 y.o. female referred by Dr. Gwenlyn [name]  to HTN clinic. PMI includes hypertension, PVD, Afib, carotid disease, migraine, stroke, STEMI, statin intolerance, and 23 documented drugs allergies/ intolerances.  She report less headaches and one episode of symptomatic hypotension.  Denies swelling, shortness or breath or blurry vision. Patient is scheduled for therapeutic phlebotomy tomorrow d/t polycythemia vera.  Current HTN meds:  Cardizem 240mg  daily Valsartan 160mg  twice daily Chlorthalidone 25mg  daily  Allergic to: Atenolol - rash and itching Propranolol Hydralazine - swelling  BP goal: 130/80 or less  Social History: former smoker, denies alcohol use  Diet: low sodium ,low caffeine, and lot of water  Home BP readings: home BP cuff (wrist) not accurate when compared to manual readying in office 27 readying; range 96/61 to 149/92 with 1 episode of symptomatic hypotension while BP at 96/61  Wt Readings from Last 3 Encounters:  11/01/19 135 lb 3.2 oz (61.3 kg)  08/15/19 133 lb 3.2 oz (60.4 kg)  07/28/19 130 lb 4.8 oz (59.1 kg)   BP Readings from Last 3 Encounters:  12/28/19 130/86  11/14/19 (!) 168/75  11/01/19 (!) 153/73   Pulse Readings from Last 3 Encounters:  12/28/19 79  11/14/19 72  11/01/19 66     Past Medical History:  Diagnosis Date  . Arthritis    "fingers, some in my knees" (01/28/2016)  . CAD (coronary artery disease), per cath 04/27/13, non obstructive disease, EF 65-70% 05/11/2013  . Carotid disease, bilateral ([place]) 09/2013   Lt carotid 50-69% stentosis, less on rt  . Dysrhythmia   . Fatty liver   . GERD (gastroesophageal reflux disease)   . Glaucoma   . H/O cardiovascular stress test 12/27/2009   negative for ischemia  . Headache    "occasional since eye pressure regulated" (01/28/2016)  . Hyperlipidemia   . Hypertension   . Leukocytosis  07/15/2015  . Migraine    "stopped w/laser holes to relieve pressure in my eyes; had them 3-4 times/wk before taht" (01/28/2016)  . Mitral regurgitation,2+ by cath 05/11/2013  . NSTEMI (non-ST elevated myocardial infarction) ([place]) 04/27/2013  . Pain    BOTH KNEES - PT HAS TORN MENISCUS LEFT KNEE  . Paroxysmal atrial fibrillation (HCC)    hx of a fib on ASA  AND ELIQUIS   . Polycythemia vera ([place]) 08/20/2015   JAK-2 positive 07/19/15  . PVD (peripheral vascular disease) with claudication ([place]) 07/2006   previous L ext iliac stent and rt SFA stent, known occluded Lt SFA  . S/P cardiac cath 04/27/13   NON OBSTRUCTIVE DISEASE, 2+ mr, ef 65-70%  . Statin intolerance   . Stroke ([place]) 2006   SWELLING ALL OVER AND RT SIDE OF FACE DRAWN AND SPEECH SLURRED AND NUMBNESS ON RIGHT SIDE-- ALL RESOLVED    Current Outpatient Medications on File Prior to Visit  Medication Sig Dispense Refill  . acetaminophen (TYLENOL) 500 MG tablet Take 2 tablets (1,000 mg total) by mouth every 6 (six) hours. 30 tablet 0  . alendronate (FOSAMAX) 70 MG tablet Take 1 tablet (70 mg total) by mouth once a week. 4 tablet 11  . apixaban (ELIQUIS) 5 MG TABS tablet Take 1 tablet (  5 mg total) by mouth 2 (two) times daily. 60 tablet 11  . ARTIFICIAL TEAR OP Place 1 drop into both eyes as needed (for dry eyes).    Marland Kitchen aspirin EC 325 MG tablet Take 325 mg by mouth daily.    . chlorthalidone (HYGROTON) 25 MG tablet TAKE 1 TABLET BY MOUTH DAILY. 30 tablet 0  . diltiazem (CARDIZEM CD) 240 MG 24 hr capsule Take 1 capsule (240 mg total) by mouth every morning. 90 capsule 3  . docusate sodium (COLACE) 100 MG capsule Take 200 mg by mouth every evening.     . ezetimibe (ZETIA) 10 MG tablet Take 1 tablet (10 mg total) by mouth daily. 90 tablet 3  . fenofibrate (TRICOR) 145 MG tablet Take 1 tablet (145 mg total) by mouth at bedtime. PLEASE CONTACT OFFICE FOR ADDITIONAL REFILLS 30 tablet 11  . flecainide (TAMBOCOR) 50 MG tablet Take 1 tablet (50 mg  total) by mouth 2 (two) times daily. PLEASE CONTACT OFFICE FOR ADDITIONAL REFILLS 60 tablet 11  . hydroxyurea (HYDREA) 500 MG capsule TAKE 1 CAPSULE DAILY. MAY TAKE WITH FOOD TO MINIMIZE GI SIDE EFFECTS 30 capsule 11  . omeprazole (PRILOSEC) 20 MG capsule Take 1 capsule (20 mg total) by mouth 2 (two) times daily before a meal. 60 capsule 11  . traMADol (ULTRAM) 50 MG tablet Take 50 mg by mouth every 8 (eight) hours as needed.    . valsartan (DIOVAN) 160 MG tablet Take 1 tablet (160 mg total) by mouth 2 (two) times daily. 60 tablet 6   No current facility-administered medications on file prior to visit.    Allergies  Allergen Reactions  . Atenolol Rash and Itching  . Demerol  [Meperidine Hcl] Itching  . Demerol [Meperidine] Other (See Comments) and Itching    Unknown, can't remember  Unknown, can't remember   . Gabapentin Anxiety and Other (See Comments)    SI and HI  . Inderal [Propranolol] Other (See Comments) and Itching    Doesn't remember   . Prednisone Other (See Comments)    Muscle spasms  . Statins Itching and Other (See Comments)    Simvastatin-caused severe itching Pravastatin-caused lesser tiching One type of statin? Caused stroke in 2006, and other symptoms as result  . Warfarin And Related Other (See Comments)    Caused nose bleeds  . Crestor [Rosuvastatin] Itching and Rash  . Docosahexaenoic Acid (Dha)     Other reaction(s): Other (See Comments) nosebleeds  . Lactose Intolerance (Gi) Other (See Comments)    Bloating, gas  . Lipitor [Atorvastatin] Rash  . Lovaza [Omega-3-Acid Ethyl Esters] Other (See Comments)    nosebleeds   . Penicillins Hives, Rash and Other (See Comments)    Has patient had a PCN reaction causing immediate rash, facial/tongue/throat swelling, SOB or lightheadedness with hypotension: Yes Has patient had a PCN reaction causing severe rash involving mucus membranes or skin necrosis: No Has patient had a PCN reaction that required hospitalization:  No Has patient had a PCN reaction occurring within the last 10 years: No If all of the above answers are "NO", then may proceed with Cephalosporin use.  Questionable high fever   . Pravastatin Itching and Rash  . Simvastatin Itching, Rash and Other (See Comments)  . Trilipix [Choline Fenofibrate] Other (See Comments)    Doesn't remember   . Warfarin Other (See Comments)    nosebleeds  . Hydralazine Swelling  . Effexor [Venlafaxine Hcl] Other (See Comments)    Doesn't remember   .  Nexium [Esomeprazole Magnesium] Rash  . Percocet [Oxycodone-Acetaminophen] Other (See Comments)    Doesn't remember   . Plavix [Clopidogrel Bisulfate] Rash    Blood pressure 130/86, pulse 79, resp. rate 14, height 5' 5.5" (1.664 m), SpO2 95 %.  Essential hypertension BP at goal and significantly improved from previous visit. Patient tolerating valsartan and chlorthalidone very well. Reports 1 episode of symptomatic hypotension that resolved after few hours. Denies falls or increase fatigue. Will continue current therapy without changes and follow up as needed.   Dosha Broshears Rodriguez-Guzman PharmD, BCPS, [place] [address] [place] [address] 12/29/2019 1:58 PM

## 2019-12-29 ENCOUNTER — Encounter: Payer: Self-pay | Admitting: Pharmacist

## 2019-12-29 NOTE — Assessment & Plan Note (Addendum)
BP at goal and significantly improved from previous visit. Patient tolerating valsartan and chlorthalidone very well. Reports 1 episode of symptomatic hypotension that resolved after few hours. Denies falls or increase fatigue. Will continue current therapy without changes and follow up as needed.

## 2020-01-02 ENCOUNTER — Inpatient Hospital Stay: Payer: Medicare Other | Attending: Hematology

## 2020-01-02 ENCOUNTER — Other Ambulatory Visit: Payer: Self-pay

## 2020-01-02 DIAGNOSIS — D45 Polycythemia vera: Secondary | ICD-10-CM | POA: Insufficient documentation

## 2020-01-02 NOTE — Patient Instructions (Signed)

## 2020-01-02 NOTE — Progress Notes (Signed)
Post phlebotomy hypotension noted (see flowsheets), patient denies any dizziness. Sandi Mealy, PA notified. Per Sandi Mealy, PA ok to discharge patient. Pt instructed per Sandi Mealy, PA to take BP at home before taking her home supply of BP medication. Pt instructed to hold her BP medication if hypotension persists. Patient verbalizes understanding and agrees with plan of care.

## 2020-01-18 ENCOUNTER — Other Ambulatory Visit: Payer: Self-pay

## 2020-01-18 MED ORDER — VALSARTAN 160 MG PO TABS: 160.0000 mg | ORAL_TABLET | Freq: Two times a day (BID) | ORAL | 1 refills | Status: DC

## 2020-01-23 ENCOUNTER — Other Ambulatory Visit: Payer: Self-pay | Admitting: Cardiovascular Disease

## 2020-01-24 ENCOUNTER — Other Ambulatory Visit: Payer: Self-pay

## 2020-01-24 ENCOUNTER — Inpatient Hospital Stay: Payer: Medicare Other | Attending: Hematology

## 2020-01-24 DIAGNOSIS — D45 Polycythemia vera: Secondary | ICD-10-CM | POA: Diagnosis not present

## 2020-01-24 DIAGNOSIS — Z7689 Persons encountering health services in other specified circumstances: Secondary | ICD-10-CM | POA: Diagnosis not present

## 2020-01-24 LAB — CBC WITH DIFFERENTIAL (CANCER CENTER ONLY)
Abs Immature Granulocytes: 0.07 10*3/uL (ref 0.00–0.07)
Basophils Absolute: 0.2 10*3/uL — ABNORMAL HIGH (ref 0.0–0.1)
Basophils Relative: 2 %
Eosinophils Absolute: 0.2 10*3/uL (ref 0.0–0.5)
Eosinophils Relative: 2 %
HCT: 44 % (ref 36.0–46.0)
Hemoglobin: 12.4 g/dL (ref 12.0–15.0)
Immature Granulocytes: 1 %
Lymphocytes Relative: 9 %
Lymphs Abs: 0.9 10*3/uL (ref 0.7–4.0)
MCH: 22 pg — ABNORMAL LOW (ref 26.0–34.0)
MCHC: 28.2 g/dL — ABNORMAL LOW (ref 30.0–36.0)
MCV: 78 fL — ABNORMAL LOW (ref 80.0–100.0)
Monocytes Absolute: 0.2 10*3/uL (ref 0.1–1.0)
Monocytes Relative: 2 %
Neutro Abs: 7.8 10*3/uL — ABNORMAL HIGH (ref 1.7–7.7)
Neutrophils Relative %: 84 %
Platelet Count: 177 10*3/uL (ref 150–400)
RBC: 5.64 MIL/uL — ABNORMAL HIGH (ref 3.87–5.11)
RDW: 23.5 % — ABNORMAL HIGH (ref 11.5–15.5)
WBC Count: 9.4 10*3/uL (ref 4.0–10.5)
nRBC: 0 % (ref 0.0–0.2)

## 2020-01-30 ENCOUNTER — Telehealth: Payer: Self-pay

## 2020-01-30 NOTE — Telephone Encounter (Signed)
-----   Message from Truitt Merle, MD sent at 01/27/2020 11:05 AM EST ----- Please let pt know her CBC result, blood counts are well controlled, continue current hydrea dose, thanks   Truitt Merle

## 2020-01-30 NOTE — Telephone Encounter (Signed)
I let [name] know that her CBC looks good and she is to continue the same does of hydrea.  She verbalized understanding

## 2020-02-05 ENCOUNTER — Other Ambulatory Visit: Payer: Self-pay

## 2020-02-05 ENCOUNTER — Ambulatory Visit: Payer: Medicare Other | Attending: Internal Medicine

## 2020-02-05 DIAGNOSIS — Z20822 Contact with and (suspected) exposure to covid-19: Secondary | ICD-10-CM

## 2020-02-07 LAB — NOVEL CORONAVIRUS, NAA: SARS-CoV-2, NAA: NOT DETECTED

## 2020-02-09 ENCOUNTER — Ambulatory Visit (INDEPENDENT_AMBULATORY_CARE_PROVIDER_SITE_OTHER): Payer: Medicare Other | Admitting: Physician Assistant

## 2020-02-09 DIAGNOSIS — L03039 Cellulitis of unspecified toe: Secondary | ICD-10-CM

## 2020-02-09 DIAGNOSIS — M5136 Other intervertebral disc degeneration, lumbar region: Secondary | ICD-10-CM

## 2020-02-09 DIAGNOSIS — F411 Generalized anxiety disorder: Secondary | ICD-10-CM

## 2020-02-09 DIAGNOSIS — I6523 Occlusion and stenosis of bilateral carotid arteries: Secondary | ICD-10-CM

## 2020-02-09 DIAGNOSIS — L989 Disorder of the skin and subcutaneous tissue, unspecified: Secondary | ICD-10-CM

## 2020-02-09 DIAGNOSIS — H9190 Unspecified hearing loss, unspecified ear: Secondary | ICD-10-CM

## 2020-02-09 DIAGNOSIS — E7800 Pure hypercholesterolemia, unspecified: Secondary | ICD-10-CM | POA: Diagnosis not present

## 2020-02-09 MED ORDER — ESCITALOPRAM OXALATE 10 MG PO TABS: 10.0000 mg | ORAL_TABLET | Freq: Every day | ORAL | 5 refills | Status: DC

## 2020-02-09 MED ORDER — TRAMADOL HCL 50 MG PO TABS: 50.0000 mg | ORAL_TABLET | Freq: Three times a day (TID) | ORAL | 2 refills | Status: DC | PRN

## 2020-02-09 NOTE — Progress Notes (Signed)
405     Telephone visit  Subjective: CC:ear  PCP: Terald Sleeper, PA-C [id number] [name] is a 69 y.o. female calls for telephone consult today. Patient provides verbal consent for consult held via phone.  Patient is identified with 2 separate identifiers.  At this time the entire area is on COVID-19 social distancing and stay home orders are in place.  Patient is of higher risk and therefore we are performing this by a virtual method.  Location of patient: home Location of provider: HOME Others present for call: no   Patient is to follow-up on her chronic medical conditions to include coronary artery disease, peripheral arterial disease, degenerative disc disease, she also reports some hearing loss in the area as well stay for quite some time.  She also has had thickening of her toenail.  We have discussed having her go for ear nose and throat to have those areas evaluated.  Podiatry she she would like to go to [place].  The referral will be placed for this.  Other than that patient reports that he is doing very well and not any difficulties.  All of her medications and labs are reviewed.    ROS: Per HPI  Allergies  Allergen Reactions  . Atenolol Rash and Itching  . Demerol  [Meperidine Hcl] Itching  . Demerol [Meperidine] Other (See Comments) and Itching    Unknown, can't remember  Unknown, can't remember   . Gabapentin Anxiety and Other (See Comments)    SI and HI  . Inderal [Propranolol] Other (See Comments) and Itching    Doesn't remember   . Prednisone Other (See Comments)    Muscle spasms  . Statins Itching and Other (See Comments)    Simvastatin-caused severe itching Pravastatin-caused lesser tiching One type of statin? Caused stroke in 2006, and other symptoms as result  . Warfarin And Related Other (See Comments)    Caused nose bleeds  . Crestor [Rosuvastatin] Itching and Rash  . Docosahexaenoic Acid (Dha)     Other reaction(s): Other (See  Comments) nosebleeds  . Lactose Intolerance (Gi) Other (See Comments)    Bloating, gas  . Lipitor [Atorvastatin] Rash  . Lovaza [Omega-3-Acid Ethyl Esters] Other (See Comments)    nosebleeds   . Penicillins Hives, Rash and Other (See Comments)    Has patient had a PCN reaction causing immediate rash, facial/tongue/throat swelling, SOB or lightheadedness with hypotension: Yes Has patient had a PCN reaction causing severe rash involving mucus membranes or skin necrosis: No Has patient had a PCN reaction that required hospitalization: No Has patient had a PCN reaction occurring within the last 10 years: No If all of the above answers are "NO", then may proceed with Cephalosporin use.  Questionable high fever   . Pravastatin Itching and Rash  . Simvastatin Itching, Rash and Other (See Comments)  . Trilipix [Choline Fenofibrate] Other (See Comments)    Doesn't remember   . Warfarin Other (See Comments)    nosebleeds  . Hydralazine Swelling  . Effexor [Venlafaxine Hcl] Other (See Comments)    Doesn't remember   . Nexium [Esomeprazole Magnesium] Rash  . Percocet [Oxycodone-Acetaminophen] Other (See Comments)    Doesn't remember   . Plavix [Clopidogrel Bisulfate] Rash   Past Medical History:  Diagnosis Date  . Arthritis    "fingers, some in my knees" (01/28/2016)  . CAD (coronary artery disease), per cath 04/27/13, non obstructive disease, EF 65-70% 05/11/2013  . Carotid disease, bilateral ([place]) 09/2013   Lt carotid  50-69% stentosis, less on rt  . Dysrhythmia   . Fatty liver   . GERD (gastroesophageal reflux disease)   . Glaucoma   . H/O cardiovascular stress test 12/27/2009   negative for ischemia  . Headache    "occasional since eye pressure regulated" (01/28/2016)  . Hyperlipidemia   . Hypertension   . Leukocytosis 07/15/2015  . Migraine    "stopped w/laser holes to relieve pressure in my eyes; had them 3-4 times/wk before taht" (01/28/2016)  . Mitral regurgitation,2+ by cath  05/11/2013  . NSTEMI (non-ST elevated myocardial infarction) ([place]) 04/27/2013  . Pain    BOTH KNEES - PT HAS TORN MENISCUS LEFT KNEE  . Paroxysmal atrial fibrillation (HCC)    hx of a fib on ASA  AND ELIQUIS   . Polycythemia vera ([place]) 08/20/2015   JAK-2 positive 07/19/15  . PVD (peripheral vascular disease) with claudication ([place]) 07/2006   previous L ext iliac stent and rt SFA stent, known occluded Lt SFA  . S/P cardiac cath 04/27/13   NON OBSTRUCTIVE DISEASE, 2+ mr, ef 65-70%  . Statin intolerance   . Stroke ([place]) 2006   SWELLING ALL OVER AND RT SIDE OF FACE DRAWN AND SPEECH SLURRED AND NUMBNESS ON RIGHT SIDE-- ALL RESOLVED    Current Outpatient Medications:  .  acetaminophen (TYLENOL) 500 MG tablet, Take 2 tablets (1,000 mg total) by mouth every 6 (six) hours., Disp: 30 tablet, Rfl: 0 .  alendronate (FOSAMAX) 70 MG tablet, Take 1 tablet (70 mg total) by mouth once a week., Disp: 4 tablet, Rfl: 11 .  apixaban (ELIQUIS) 5 MG TABS tablet, Take 1 tablet (5 mg total) by mouth 2 (two) times daily., Disp: 60 tablet, Rfl: 11 .  ARTIFICIAL TEAR OP, Place 1 drop into both eyes as needed (for dry eyes)., Disp: , Rfl:  .  aspirin EC 325 MG tablet, Take 325 mg by mouth daily., Disp: , Rfl:  .  chlorthalidone (HYGROTON) 25 MG tablet, TAKE 1 TABLET BY MOUTH DAILY., Disp: 30 tablet, Rfl: 5 .  diltiazem (CARDIZEM CD) 240 MG 24 hr capsule, Take 1 capsule (240 mg total) by mouth every morning., Disp: 90 capsule, Rfl: 3 .  docusate sodium (COLACE) 100 MG capsule, Take 200 mg by mouth every evening. , Disp: , Rfl:  .  ezetimibe (ZETIA) 10 MG tablet, Take 1 tablet (10 mg total) by mouth daily., Disp: 90 tablet, Rfl: 3 .  fenofibrate (TRICOR) 145 MG tablet, Take 1 tablet (145 mg total) by mouth at bedtime. PLEASE CONTACT OFFICE FOR ADDITIONAL REFILLS, Disp: 30 tablet, Rfl: 11 .  flecainide (TAMBOCOR) 50 MG tablet, Take 1 tablet (50 mg total) by mouth 2 (two) times daily. PLEASE CONTACT OFFICE FOR ADDITIONAL  REFILLS, Disp: 60 tablet, Rfl: 11 .  hydroxyurea (HYDREA) 500 MG capsule, TAKE 1 CAPSULE DAILY. MAY TAKE WITH FOOD TO MINIMIZE GI SIDE EFFECTS, Disp: 30 capsule, Rfl: 11 .  omeprazole (PRILOSEC) 20 MG capsule, Take 1 capsule (20 mg total) by mouth 2 (two) times daily before a meal., Disp: 60 capsule, Rfl: 11 .  traMADol (ULTRAM) 50 MG tablet, Take 50 mg by mouth every 8 (eight) hours as needed., Disp: , Rfl:  .  valsartan (DIOVAN) 160 MG tablet, Take 1 tablet (160 mg total) by mouth 2 (two) times daily., Disp: 180 tablet, Rfl: 1  Assessment/ Plan: 69 y.o. female   1. Pure hypercholesterolemia Continue medications  2. Bilateral carotid artery stenosis Continue medications  3. DDD (degenerative disc disease),  lumbar - traMADol (ULTRAM) 50 MG tablet; Take 1 tablet (50 mg total) by mouth every 8 (eight) hours as needed.  Dispense: 30 tablet; Refill: 2  4. GAD (generalized anxiety disorder) - escitalopram (LEXAPRO) 10 MG tablet; Take 1 tablet (10 mg total) by mouth daily.  Dispense: 30 tablet; Refill: 5   No follow-ups on file.  Continue all other maintenance medications as listed above.  Start time: 4:05 PM End time: 4:18 PM  No orders of the defined types were placed in this encounter.   Particia Nearing PA-C [facility] [phone]

## 2020-02-12 ENCOUNTER — Encounter: Payer: Self-pay | Admitting: Physician Assistant

## 2020-02-12 DIAGNOSIS — F411 Generalized anxiety disorder: Secondary | ICD-10-CM | POA: Insufficient documentation

## 2020-02-12 DIAGNOSIS — M5136 Other intervertebral disc degeneration, lumbar region: Secondary | ICD-10-CM | POA: Insufficient documentation

## 2020-02-12 DIAGNOSIS — M51369 Other intervertebral disc degeneration, lumbar region without mention of lumbar back pain or lower extremity pain: Secondary | ICD-10-CM | POA: Insufficient documentation

## 2020-02-26 ENCOUNTER — Other Ambulatory Visit: Payer: Self-pay

## 2020-02-26 ENCOUNTER — Encounter: Payer: Self-pay | Admitting: Podiatry

## 2020-02-26 ENCOUNTER — Ambulatory Visit (INDEPENDENT_AMBULATORY_CARE_PROVIDER_SITE_OTHER): Payer: Medicare Other | Admitting: Podiatry

## 2020-02-26 DIAGNOSIS — I739 Peripheral vascular disease, unspecified: Secondary | ICD-10-CM | POA: Diagnosis not present

## 2020-02-26 DIAGNOSIS — B351 Tinea unguium: Secondary | ICD-10-CM

## 2020-02-26 DIAGNOSIS — M79675 Pain in left toe(s): Secondary | ICD-10-CM | POA: Diagnosis not present

## 2020-02-26 DIAGNOSIS — M79674 Pain in right toe(s): Secondary | ICD-10-CM

## 2020-02-26 DIAGNOSIS — Z7901 Long term (current) use of anticoagulants: Secondary | ICD-10-CM | POA: Diagnosis not present

## 2020-02-26 NOTE — Progress Notes (Signed)
Subjective:   Patient ID: [name], female   DOB: 69 y.o.   MRN: [id number]   HPI 69 year old female presents the office today for concerns of her thick toenails on the left foot particular her left big toe.  She had to cut it because the nails becoming large and causing pressure on her second toe.  She has a history of a below-the-knee amputation on the right side given peripheral vascular disease, infection.  Ulceration the left side but she does state that she has poor circulation in her left foot as well.   Review of Systems  All other systems reviewed and are negative.  Past Medical History:  Diagnosis Date  . Arthritis    "fingers, some in my knees" (01/28/2016)  . CAD (coronary artery disease), per cath 04/27/13, non obstructive disease, EF 65-70% 05/11/2013  . Carotid disease, bilateral ([place]) 09/2013   Lt carotid 50-69% stentosis, less on rt  . Dysrhythmia   . Fatty liver   . GERD (gastroesophageal reflux disease)   . Glaucoma   . H/O cardiovascular stress test 12/27/2009   negative for ischemia  . Headache    "occasional since eye pressure regulated" (01/28/2016)  . Hyperlipidemia   . Hypertension   . Leukocytosis 07/15/2015  . Migraine    "stopped w/laser holes to relieve pressure in my eyes; had them 3-4 times/wk before taht" (01/28/2016)  . Mitral regurgitation,2+ by cath 05/11/2013  . NSTEMI (non-ST elevated myocardial infarction) ([place]) 04/27/2013  . Pain    BOTH KNEES - PT HAS TORN MENISCUS LEFT KNEE  . Paroxysmal atrial fibrillation (HCC)    hx of a fib on ASA  AND ELIQUIS   . Polycythemia vera ([place]) 08/20/2015   JAK-2 positive 07/19/15  . PVD (peripheral vascular disease) with claudication ([place]) 07/2006   previous L ext iliac stent and rt SFA stent, known occluded Lt SFA  . S/P cardiac cath 04/27/13   NON OBSTRUCTIVE DISEASE, 2+ mr, ef 65-70%  . Statin intolerance   . Stroke ([place]) 2006   SWELLING ALL OVER AND RT SIDE OF FACE DRAWN AND SPEECH SLURRED AND NUMBNESS ON RIGHT  SIDE-- ALL RESOLVED    Past Surgical History:  Procedure Laterality Date  . AMPUTATION Right 05/21/2017   Procedure: AMPUTATION ABOVE KNEE;  Surgeon: Rosetta Posner, MD;  Location: [place];  Service: Vascular;  Laterality: Right;  . CARDIAC CATHETERIZATION  04/27/13   non occlusive disease with mild to mod. calcified lesions in ostial LM and proximal LAD and moderate prox. RC AND DISTAL AV GROOVE LCX, ef  . CATARACT EXTRACTION W/PHACO Right 03/21/2015   Procedure: CATARACT EXTRACTION PHACO AND INTRAOCULAR LENS PLACEMENT RIGHT EYE;  Surgeon: Baruch Goldmann, MD;  Location: [facility];  Service: Ophthalmology;  Laterality: Right;  CDE:5.10  . CATARACT EXTRACTION W/PHACO Left 05/16/2015   Procedure: CATARACT EXTRACTION PHACO AND INTRAOCULAR LENS PLACEMENT (IOC);  Surgeon: Baruch Goldmann, MD;  Location: [facility];  Service: Ophthalmology;  Laterality: Left;  CDE 5.57  . [place]  . COLONOSCOPY N/A 09/11/2016   Procedure: COLONOSCOPY;  Surgeon: Rogene Houston, MD;  [facility];  Service: Endoscopy;  Laterality: N/A;  730-moved to 1:00 Ann notified pt  . EMBOLECTOMY Right 02/01/2016   Procedure: Thrombectomy of Right Femoral-Popliteal Bypass Graft; Endarterectomy of Right Below Knee Popliteal Artery and Tibial Peroneal Trunk with Bovine Pericardium Patch Angioplasty.;  Surgeon: Angelia Mould, MD;  Location: [place];  Service: Vascular;  Laterality: Right;  .  FEMORAL-POPLITEAL BYPASS GRAFT Right 01/31/2016   Procedure: BYPASS GRAFT RIGHT COMMON  FEMORALTO BELOW KNEE POPLITEAL ARTERY BYPASS GRAFT USING 6MM PROPATEN GORTEX GRAFT;  Surgeon: Mal Misty, MD;  Location: [place];  Service: Vascular;  Laterality: Right;  . FEMORAL-POPLITEAL BYPASS GRAFT Right 01/09/2017   Procedure: THROMBECTOMY OF RIGHT FEMORAL-POPLITEAL  ARTERY BYPASS GRAFT; THROMBECTOMY RIGHT  TIBIAL VESSELS;  Surgeon: [name], MD;  Location: [place];  Service: Vascular;  Laterality: Right;  . FEMORAL-POPLITEAL BYPASS  GRAFT Right 05/17/2017   Procedure: RIGHT FEMORAL-TIBIAL PERONEAL TRUNK ARTERY BYPASS GRAFT USING 67mmX80cm PROPATEN GRAFT WITH REMOVABLE RING;  Surgeon: Rosetta Posner, MD;  Location: [place];  Service: Vascular;  Laterality: Right;  . FRACTURE SURGERY    . ILIAC ARTERY STENT  07/2006   external iliac and Rt SFA stent 07/2006 AND THE LEFT WAS IN 2006  . KNEE ARTHROSCOPY WITH MEDIAL MENISECTOMY Left 03/27/2014   Procedure: left knee arthorscopy with medial chondraplasty of the medial femoral and patella, medial microfracture technique of medial femoral condyl;  Surgeon: Tobi Bastos, MD;  Location: [facility];  Service: Orthopedics;  Laterality: Left;  . LEFT HEART CATHETERIZATION WITH CORONARY ANGIOGRAM Right 04/27/2013   Procedure: LEFT HEART CATHETERIZATION WITH CORONARY ANGIOGRAM;  Surgeon: Leonie Man, MD;  Location: [facility] CATH LAB;  Service: Cardiovascular;  Laterality: Right;  . LOWER EXTREMITY ANGIOGRAM Right 01/31/2016   Procedure: INTRAOP RIGHT LOWER EXTREMITY ANGIOGRAM;  Surgeon: Mal Misty, MD;  Location: [place];  Service: Vascular;  Laterality: Right;  . ORIF WRIST FRACTURE Right 1983  . OVARIAN CYST REMOVAL Right   . PATCH ANGIOPLASTY Right 01/31/2016   Procedure: RIGHT COMMON FEMORAL AND PROFUNDA FEMORIS ENDARECTOMY WITH PATCH ANGIOPLASTY;  Surgeon: Mal Misty, MD;  Location: [place];  Service: Vascular;  Laterality: Right;  . PATCH ANGIOPLASTY Right 01/09/2017   Procedure: PATCH ANGIOPLASTY RIGHT POPLITEAL ARTERY BYPASS GRAFT;  Surgeon: [name], MD;  Location: [place];  Service: Vascular;  Laterality: Right;  . PERIPHERAL VASCULAR CATHETERIZATION N/A 06/27/2015   Procedure: Lower Extremity Angiography;  Surgeon: Lorretta Harp, MD;  Location: [place] CV LAB;  Service: Cardiovascular;  Laterality: N/A;  . PERIPHERAL VASCULAR CATHETERIZATION Bilateral 01/29/2016   Procedure: Lower Extremity Angiography;  Surgeon: Wellington Hampshire, MD;  Location: [place] CV LAB;  Service:  Cardiovascular;  Laterality: Bilateral;  . PERIPHERAL VASCULAR CATHETERIZATION N/A 01/29/2016   Procedure: Abdominal Aortogram;  Surgeon: Wellington Hampshire, MD;  Location: [place] CV LAB;  Service: Cardiovascular;  Laterality: N/A;  . REFRACTIVE SURGERY Bilateral    "6 in one eye; 7 in the other; to relieve pressure; not glaucoma" (01/28/2016)  . SFA Right 06/27/2015   overlapping Bahn covered stents  . TUBAL LIGATION  1983  . VEIN HARVEST Left 05/17/2017   Procedure: LEFT LEG GREATER SAPHENOUS VEIN HARVEST;  Surgeon: Rosetta Posner, MD;  Location: [place];  Service: Vascular;  Laterality: Left;  Marland Kitchen VEIN REPAIR Right 01/31/2016   Procedure: RIGHT GREATER SAPHENOUS VEIN EXAMNED BUT NOT REMOVED;  Surgeon: Mal Misty, MD;  [facility];  Service: Vascular;  Laterality: Right;     Current Outpatient Medications:  .  acetaminophen (TYLENOL) 500 MG tablet, Take 2 tablets (1,000 mg total) by mouth every 6 (six) hours., Disp: 30 tablet, Rfl: 0 .  alendronate (FOSAMAX) 70 MG tablet, Take 1 tablet (70 mg total) by mouth once a week., Disp: 4 tablet, Rfl: 11 .  apixaban (ELIQUIS) 5  MG TABS tablet, Take 1 tablet (5 mg total) by mouth 2 (two) times daily., Disp: 60 tablet, Rfl: 11 .  ARTIFICIAL TEAR OP, Place 1 drop into both eyes as needed (for dry eyes)., Disp: , Rfl:  .  aspirin EC 325 MG tablet, Take 325 mg by mouth daily., Disp: , Rfl:  .  chlorthalidone (HYGROTON) 25 MG tablet, TAKE 1 TABLET BY MOUTH DAILY., Disp: 30 tablet, Rfl: 5 .  diltiazem (CARDIZEM CD) 240 MG 24 hr capsule, Take 1 capsule (240 mg total) by mouth every morning., Disp: 90 capsule, Rfl: 3 .  docusate sodium (COLACE) 100 MG capsule, Take 200 mg by mouth every evening. , Disp: , Rfl:  .  escitalopram (LEXAPRO) 10 MG tablet, Take 1 tablet (10 mg total) by mouth daily., Disp: 30 tablet, Rfl: 5 .  ezetimibe (ZETIA) 10 MG tablet, Take 1 tablet (10 mg total) by mouth daily., Disp: 90 tablet, Rfl: 3 .  fenofibrate (TRICOR) 145 MG  tablet, Take 1 tablet (145 mg total) by mouth at bedtime. PLEASE CONTACT OFFICE FOR ADDITIONAL REFILLS, Disp: 30 tablet, Rfl: 11 .  flecainide (TAMBOCOR) 50 MG tablet, Take 1 tablet (50 mg total) by mouth 2 (two) times daily. PLEASE CONTACT OFFICE FOR ADDITIONAL REFILLS, Disp: 60 tablet, Rfl: 11 .  hydroxyurea (HYDREA) 500 MG capsule, TAKE 1 CAPSULE DAILY. MAY TAKE WITH FOOD TO MINIMIZE GI SIDE EFFECTS, Disp: 30 capsule, Rfl: 11 .  omeprazole (PRILOSEC) 20 MG capsule, Take 1 capsule (20 mg total) by mouth 2 (two) times daily before a meal., Disp: 60 capsule, Rfl: 11 .  traMADol (ULTRAM) 50 MG tablet, Take 1 tablet (50 mg total) by mouth every 8 (eight) hours as needed., Disp: 30 tablet, Rfl: 2 .  valsartan (DIOVAN) 160 MG tablet, Take 1 tablet (160 mg total) by mouth 2 (two) times daily., Disp: 180 tablet, Rfl: 1  Allergies  Allergen Reactions  . Atenolol Rash and Itching  . Demerol  [Meperidine Hcl] Itching  . Demerol [Meperidine] Other ([name] Comments) and Itching    Unknown, can't remember  Unknown, can't remember   . Gabapentin Anxiety and Other ([name] Comments)    SI and HI  . Inderal [Propranolol] Other ([name] Comments) and Itching    Doesn't remember   . Prednisone Other ([name] Comments)    Muscle spasms  . Statins Itching and Other ([name] Comments)    Simvastatin-caused severe itching Pravastatin-caused lesser tiching One type of statin? Caused stroke in 2006, and other symptoms as result  . Warfarin And Related Other ([name] Comments)    Caused nose bleeds  . Crestor [Rosuvastatin] Itching and Rash  . Docosahexaenoic Acid ([place])     Other reaction(s): Other ([name] Comments) nosebleeds  . Lactose Intolerance (Gi) Other ([name] Comments)    Bloating, gas  . Lipitor [Atorvastatin] Rash  . Lovaza [Omega-3-Acid Ethyl Esters] Other ([name] Comments)    nosebleeds   . Penicillins Hives, Rash and Other ([name] Comments)    Has patient had a PCN reaction causing immediate rash, facial/tongue/throat  swelling, SOB or lightheadedness with hypotension: Yes Has patient had a PCN reaction causing severe rash involving mucus membranes or skin necrosis: No Has patient had a PCN reaction that required hospitalization: No Has patient had a PCN reaction occurring within the last 10 years: No If all of the above answers are "NO", then may proceed with Cephalosporin use.  Questionable high fever   . Pravastatin Itching and Rash  . Simvastatin Itching, Rash and Other (  [name] Comments)  . Trilipix [Choline Fenofibrate] Other ([name] Comments)    Doesn't remember   . Warfarin Other ([name] Comments)    nosebleeds  . Hydralazine Swelling  . Effexor [Venlafaxine Hcl] Other ([name] Comments)    Doesn't remember   . Nexium [Esomeprazole Magnesium] Rash  . Percocet [Oxycodone-Acetaminophen] Other ([name] Comments)    Doesn't remember   . Plavix [Clopidogrel Bisulfate] Rash         Objective:  Physical Exam  General: AAO x3, NAD  Dermatological: Nails are significantly hypertrophic, dystrophic and discolored on the left side particularly left hallux.  There is no edema, erythema, signs of infection currently.  There is no open lesions on left side.  Vascular: Pulses decreased left side there is no pain with calf compression, swelling, warmth, erythema.   Neruologic: Sensation decreased with Thornell Mule monofilament  Musculoskeletal: Above-the-knee amputation right side  Gait: In wheelchair      Assessment:   Symptomatic onychomycosis; PAD    Plan:  -Treatment options discussed including all alternatives, risks, and complications -Etiology of symptoms were discussed -Nails debrided 5 without complications or bleeding. -Daily foot inspection -Follow-up in 3 months or sooner if any problems arise. In the meantime, encouraged to call the office with any questions, concerns, change in symptoms.   Celesta Gentile, DPM

## 2020-03-05 ENCOUNTER — Telehealth: Payer: Self-pay | Admitting: Hematology

## 2020-03-05 NOTE — Telephone Encounter (Signed)
Pt cld to cxl appt w/Dr. Burr Medico on 3/17. Per Ms. Lamantia, she will cb to reschedule

## 2020-03-06 ENCOUNTER — Inpatient Hospital Stay: Payer: Medicare Other

## 2020-03-06 ENCOUNTER — Inpatient Hospital Stay: Payer: Medicare Other | Admitting: Hematology

## 2020-03-08 ENCOUNTER — Other Ambulatory Visit: Payer: Self-pay

## 2020-03-08 ENCOUNTER — Emergency Department (HOSPITAL_COMMUNITY): Payer: Medicare Other

## 2020-03-08 ENCOUNTER — Emergency Department (HOSPITAL_COMMUNITY)
Admission: EM | Admit: 2020-03-08 | Discharge: 2020-03-08 | Disposition: A | Discharge status: Home / Self Care | Payer: Medicare Other | Attending: Emergency Medicine | Admitting: Emergency Medicine

## 2020-03-08 ENCOUNTER — Encounter (HOSPITAL_COMMUNITY): Payer: Self-pay

## 2020-03-08 DIAGNOSIS — Z8673 Personal history of transient ischemic attack (TIA), and cerebral infarction without residual deficits: Secondary | ICD-10-CM | POA: Insufficient documentation

## 2020-03-08 DIAGNOSIS — Y92018 Other place in single-family (private) house as the place of occurrence of the external cause: Secondary | ICD-10-CM | POA: Insufficient documentation

## 2020-03-08 DIAGNOSIS — Z7901 Long term (current) use of anticoagulants: Secondary | ICD-10-CM | POA: Insufficient documentation

## 2020-03-08 DIAGNOSIS — Y998 Other external cause status: Secondary | ICD-10-CM | POA: Insufficient documentation

## 2020-03-08 DIAGNOSIS — S0101XA Laceration without foreign body of scalp, initial encounter: Secondary | ICD-10-CM | POA: Diagnosis not present

## 2020-03-08 DIAGNOSIS — Y92009 Unspecified place in unspecified non-institutional (private) residence as the place of occurrence of the external cause: Secondary | ICD-10-CM

## 2020-03-08 DIAGNOSIS — Y9389 Activity, other specified: Secondary | ICD-10-CM | POA: Insufficient documentation

## 2020-03-08 DIAGNOSIS — S51811A Laceration without foreign body of right forearm, initial encounter: Secondary | ICD-10-CM | POA: Insufficient documentation

## 2020-03-08 DIAGNOSIS — Z87891 Personal history of nicotine dependence: Secondary | ICD-10-CM | POA: Diagnosis not present

## 2020-03-08 DIAGNOSIS — Z89611 Acquired absence of right leg above knee: Secondary | ICD-10-CM | POA: Insufficient documentation

## 2020-03-08 DIAGNOSIS — I2510 Atherosclerotic heart disease of native coronary artery without angina pectoris: Secondary | ICD-10-CM | POA: Insufficient documentation

## 2020-03-08 DIAGNOSIS — W050XXA Fall from non-moving wheelchair, initial encounter: Secondary | ICD-10-CM | POA: Diagnosis not present

## 2020-03-08 DIAGNOSIS — S0990XA Unspecified injury of head, initial encounter: Secondary | ICD-10-CM | POA: Diagnosis present

## 2020-03-08 DIAGNOSIS — Z7982 Long term (current) use of aspirin: Secondary | ICD-10-CM | POA: Diagnosis not present

## 2020-03-08 DIAGNOSIS — Z79899 Other long term (current) drug therapy: Secondary | ICD-10-CM | POA: Diagnosis not present

## 2020-03-08 DIAGNOSIS — I10 Essential (primary) hypertension: Secondary | ICD-10-CM | POA: Insufficient documentation

## 2020-03-08 DIAGNOSIS — I252 Old myocardial infarction: Secondary | ICD-10-CM | POA: Insufficient documentation

## 2020-03-08 DIAGNOSIS — W19XXXA Unspecified fall, initial encounter: Secondary | ICD-10-CM

## 2020-03-08 IMAGING — CT CT HEAD W/O CM
3 series · 14 of 47 positions shown, 16 images · non-contrast
Comparison: None.

CLINICAL DATA: Fall

EXAM:
CT HEAD WITHOUT CONTRAST
CT CERVICAL SPINE WITHOUT CONTRAST
TECHNIQUE: Multidetector CT imaging of the head and cervical spine was
performed following the standard protocol without intravenous
contrast. Multiplanar CT image reconstructions of the cervical spine
were also generated.

[Series 2: head w o · axial · 0.47mm/px · z∈[+27,+152]mm · 8 of 30 slices shown, 10 images]
[im 3/30  brain]
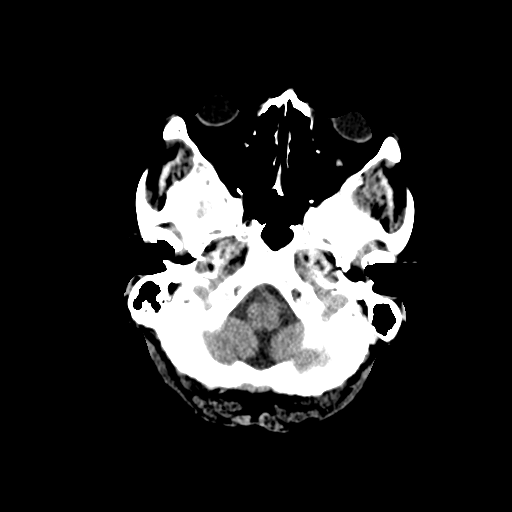
[im 3/30  bone]
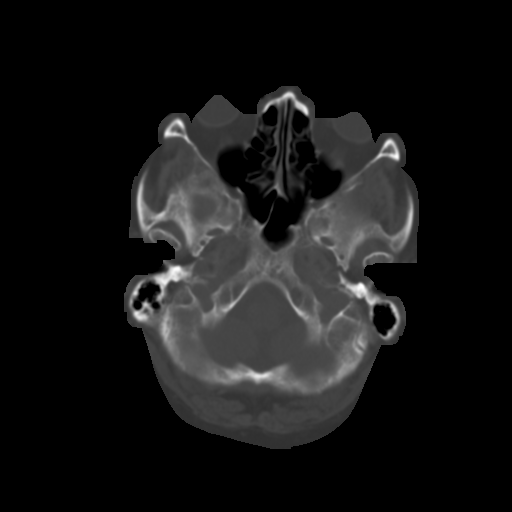
[im 7/30  brain]
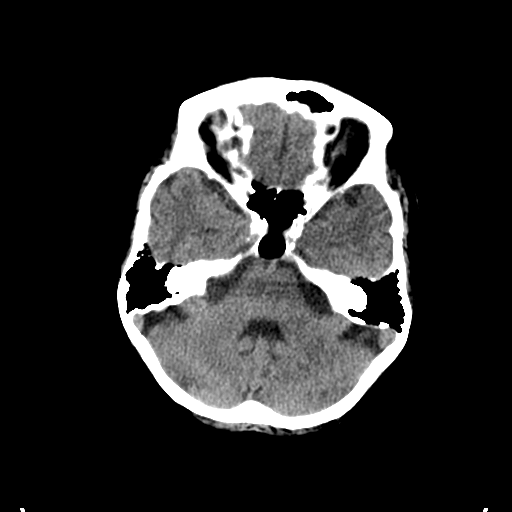
[im 10/30  brain]
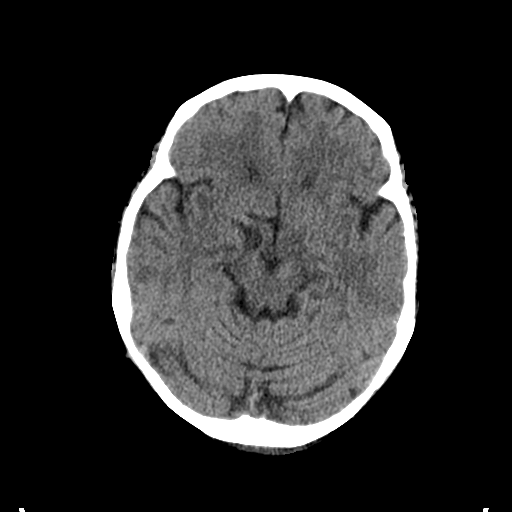
[im 14/30  brain]
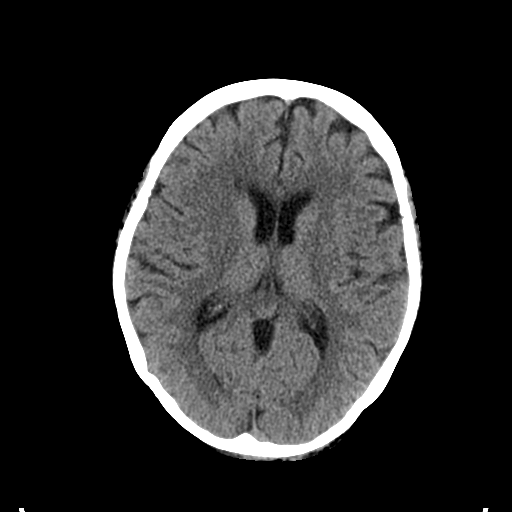
[im 17/30  brain]
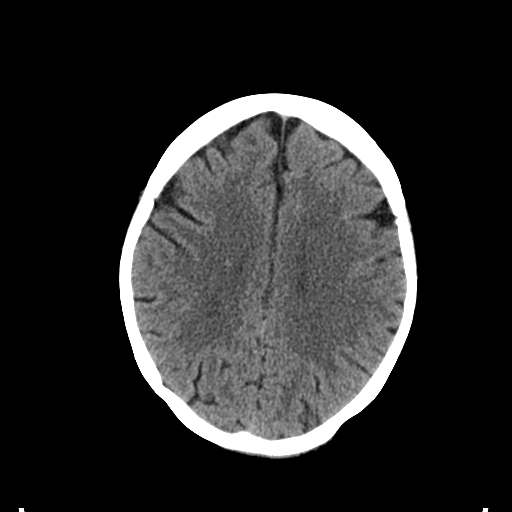
[im 17/30  bone]
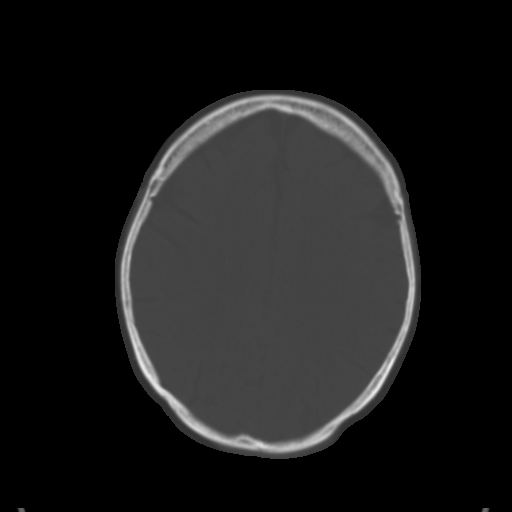
[im 21/30  brain]
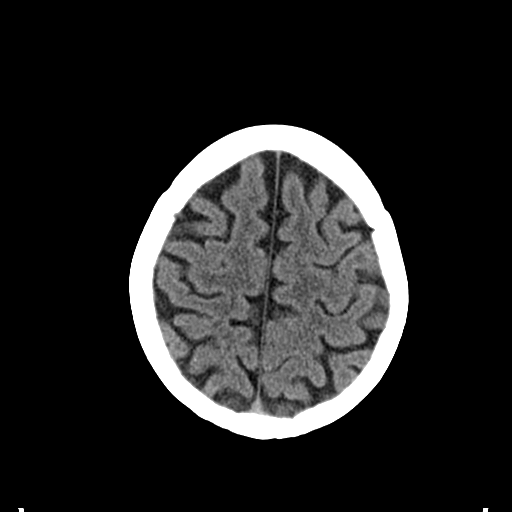
[im 24/30  brain]
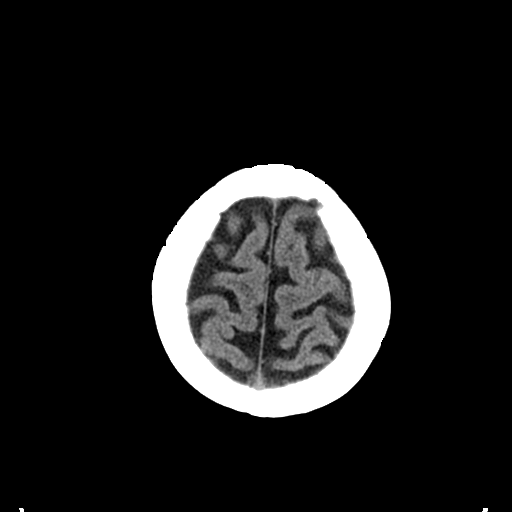
[im 28/30  brain]
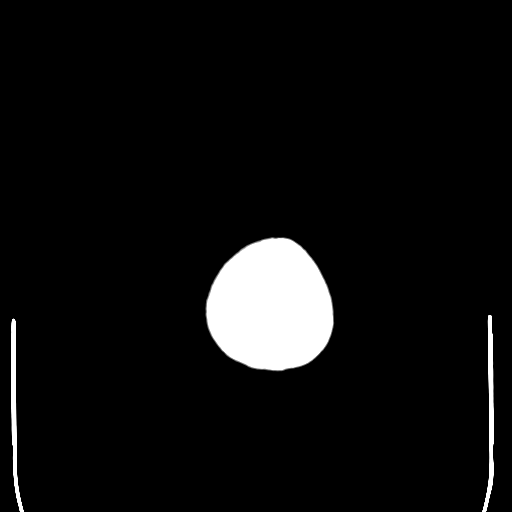

[Series 4: coronal soft · coronal · 0.29mm/px · 3 of 63 slices shown]
[im 21/63  brain]
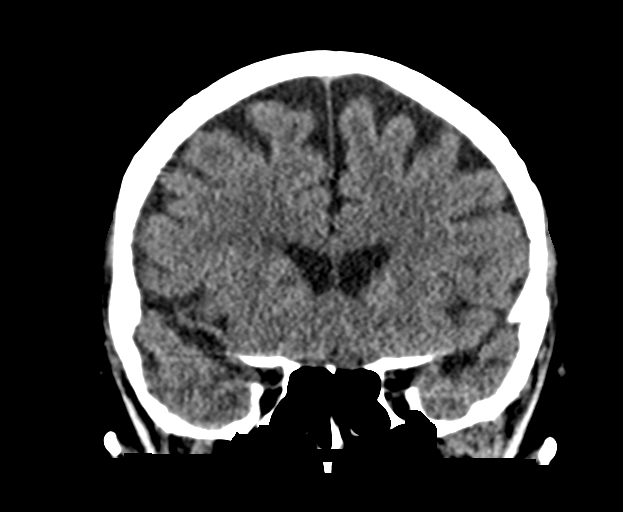
[im 28/63  brain]
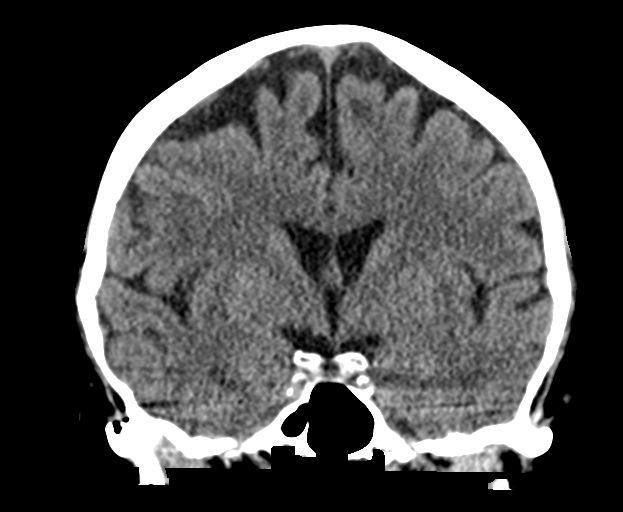
[im 35/63  brain]
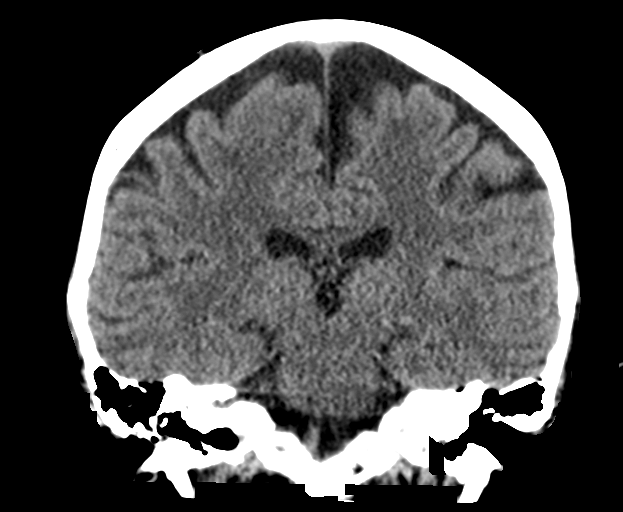

[Series 5: sagittal soft · sagittal · 0.29mm/px · 3 of 53 slices shown]
[im 18/53  brain]
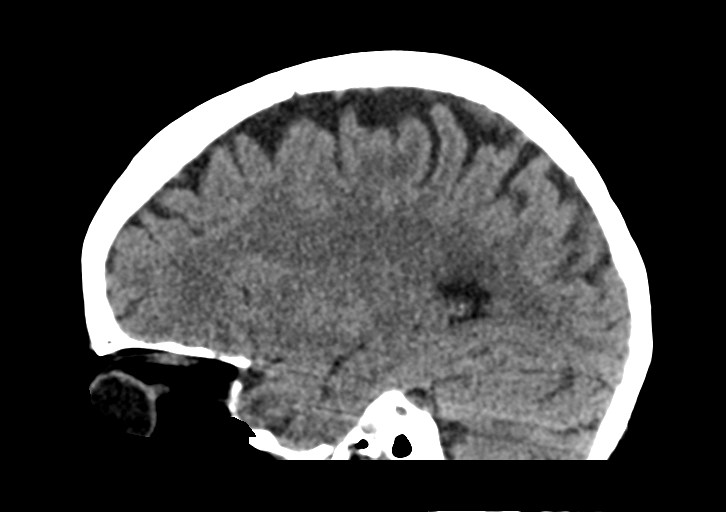
[im 27/53  brain]
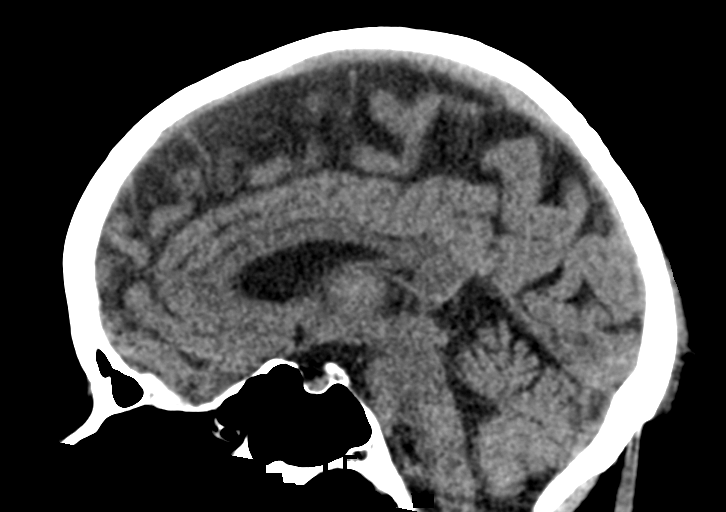
[im 35/53  brain]
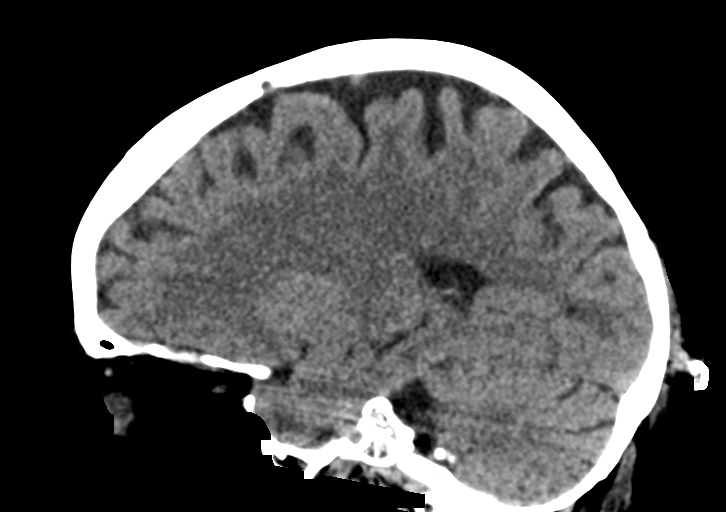

[14 of 47 positions shown; findings below may reference images not displayed]

FINDINGS: CT HEAD FINDINGS

Brain: There is no mass, hemorrhage or extra-axial collection. The
size and configuration of the ventricles and extra-axial CSF spaces
are normal. Old small vessel infarct of the right caudate head.

Vascular: No abnormal hyperdensity of the major intracranial
arteries or dural venous sinuses. No intracranial atherosclerosis.

Skull: The visualized skull base, calvarium and extracranial soft
tissues are normal.

Sinuses/Orbits: No fluid levels or advanced mucosal thickening of
the visualized paranasal sinuses. No mastoid or middle ear effusion.
The orbits are normal.

CT CERVICAL SPINE FINDINGS

Alignment: No static subluxation. Facets are aligned. Occipital
condyles are normally positioned.

Skull base and vertebrae: No acute fracture.

Soft tissues and spinal canal: No prevertebral fluid or swelling. No
visible canal hematoma.

Disc levels: No advanced spinal canal or neural foraminal stenosis.
Fusion of left C3-4 facets.

Upper chest: No pneumothorax, pulmonary nodule or pleural effusion.

Other: Normal visualized paraspinal cervical soft tissues.
IMPRESSION: 1. No acute intracranial abnormality.
2. Old right caudate head small vessel infarct.
3. No acute fracture or static subluxation of the cervical spine.

## 2020-03-08 IMAGING — CT CT CERVICAL SPINE W/O CM
3 series · 13 of 33 positions shown, 16 images · non-contrast
Comparison: None.

CLINICAL DATA: Fall

EXAM:
CT HEAD WITHOUT CONTRAST
CT CERVICAL SPINE WITHOUT CONTRAST
TECHNIQUE: Multidetector CT imaging of the head and cervical spine was
performed following the standard protocol without intravenous
contrast. Multiplanar CT image reconstructions of the cervical spine
were also generated.

[Series 4: c spine soft · axial · 0.28mm/px · z∈[-122,-12]mm · 5 of 81 slices shown, 7 images]
[im 13/81  soft-tissue]
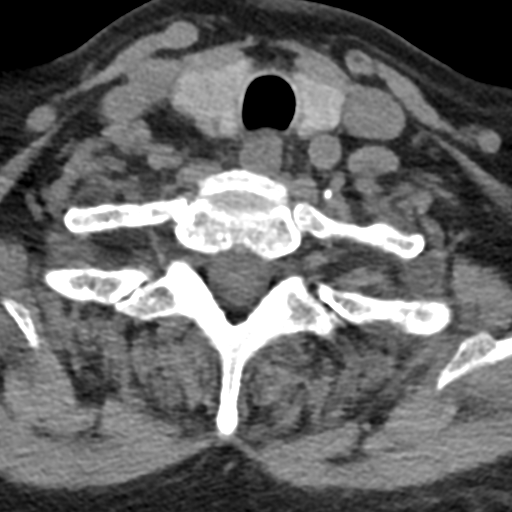
[im 13/81  bone]
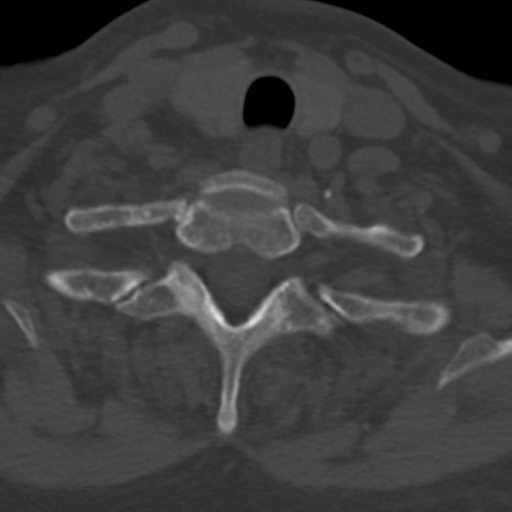
[im 25/81  bone]
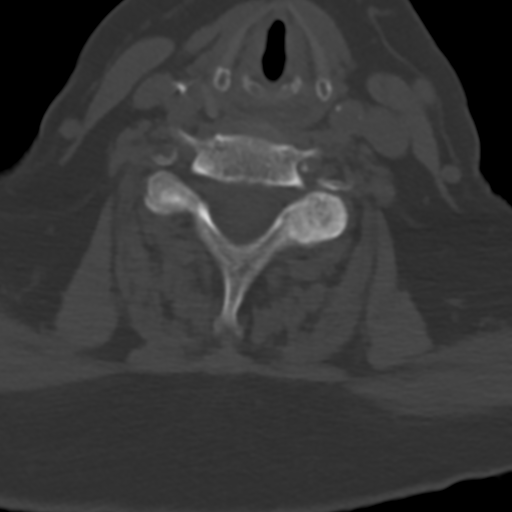
[im 44/81  bone]
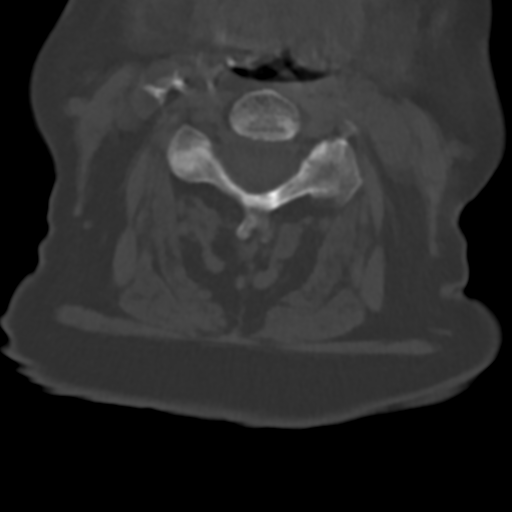
[im 56/81  bone]
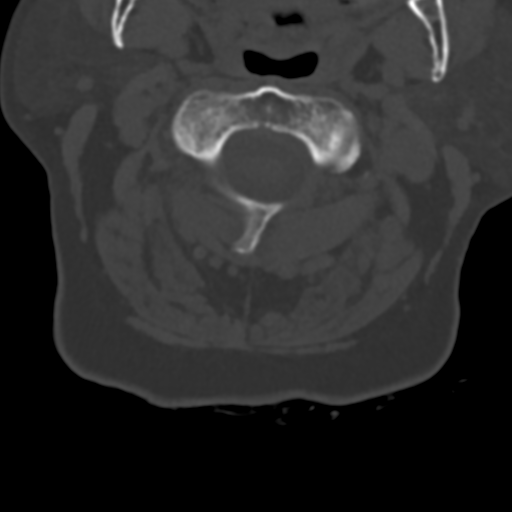
[im 68/81  soft-tissue]
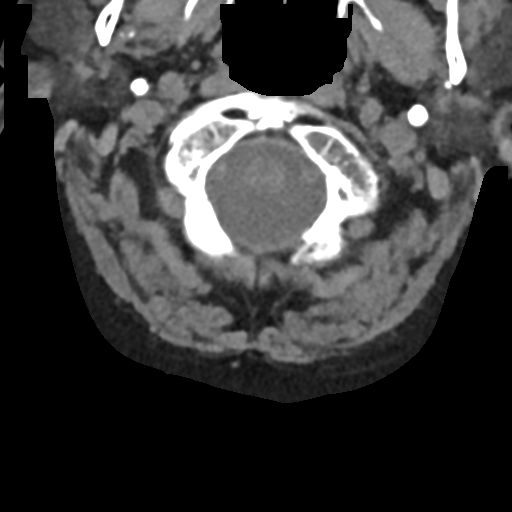
[im 68/81  bone]
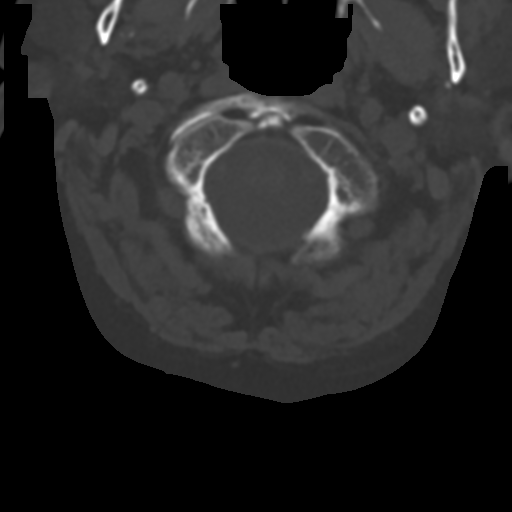

[Series 5: sagittal bone · sagittal · 0.23mm/px · 5 of 61 slices shown, 6 images]
[im 21/61  bone]
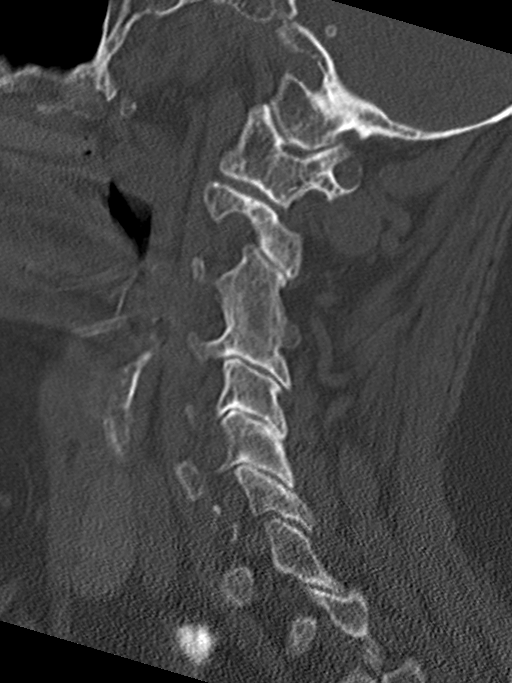
[im 26/61  bone]
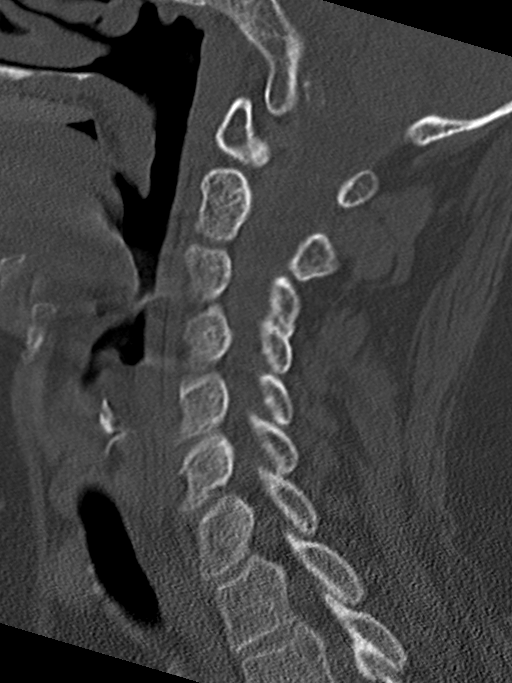
[im 31/61  soft-tissue]
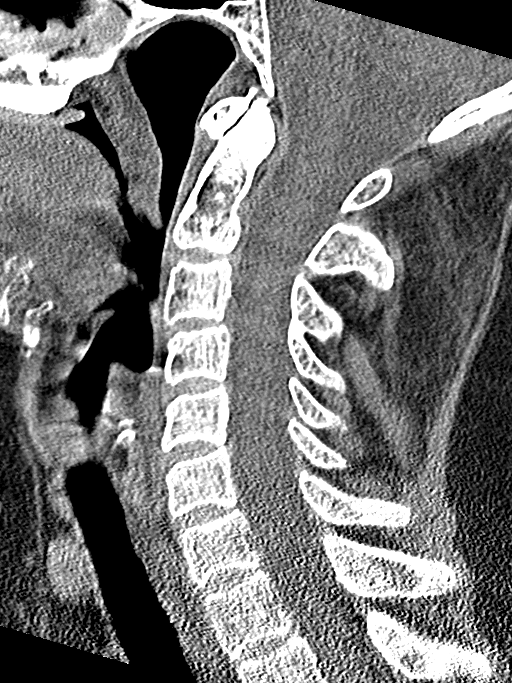
[im 31/61  bone]
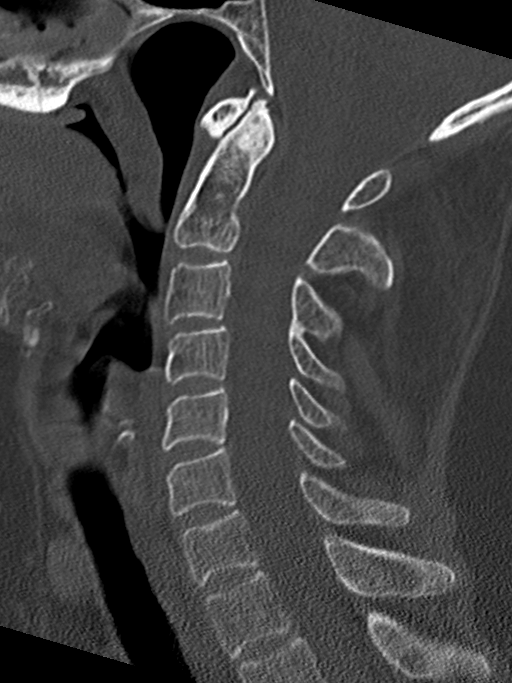
[im 36/61  bone]
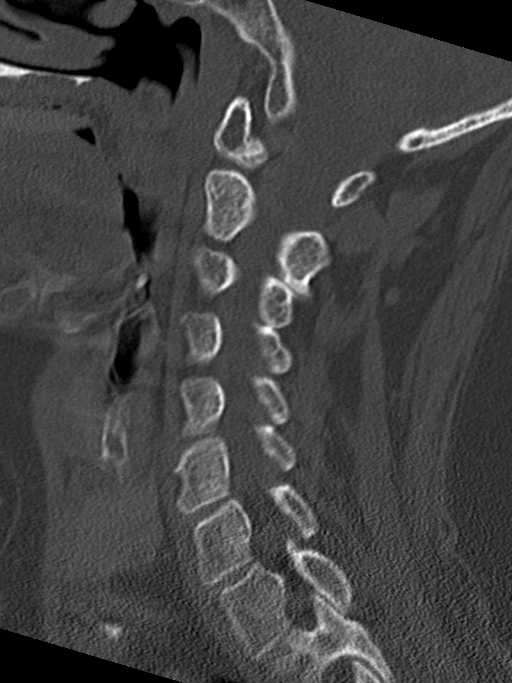
[im 41/61  bone]
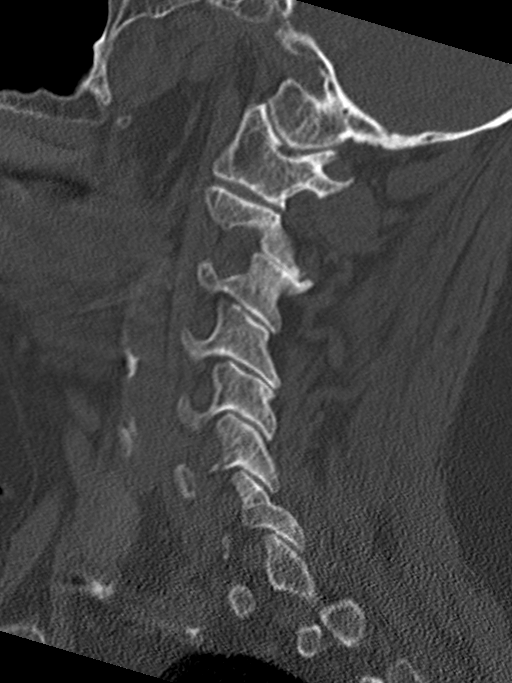

[Series 6: coronal bone · coronal · 0.23mm/px · 3 of 61 slices shown]
[im 13/61  bone]
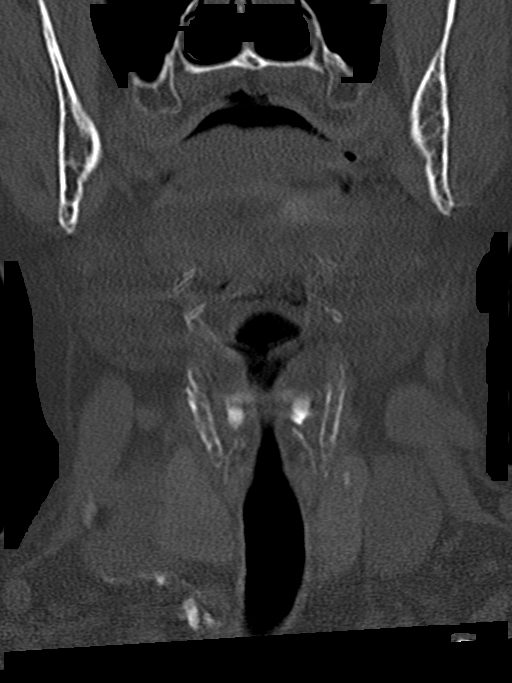
[im 25/61  bone]
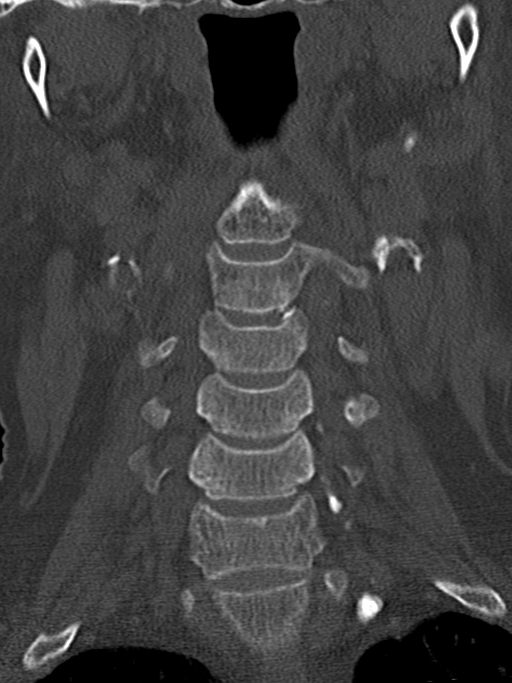
[im 37/61  bone]
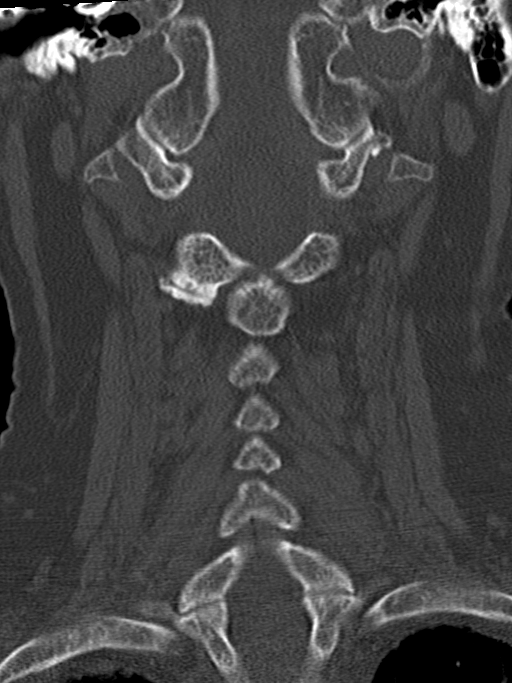

[13 of 33 positions shown; findings below may reference images not displayed]

FINDINGS: CT HEAD FINDINGS

Brain: There is no mass, hemorrhage or extra-axial collection. The
size and configuration of the ventricles and extra-axial CSF spaces
are normal. Old small vessel infarct of the right caudate head.

Vascular: No abnormal hyperdensity of the major intracranial
arteries or dural venous sinuses. No intracranial atherosclerosis.

Skull: The visualized skull base, calvarium and extracranial soft
tissues are normal.

Sinuses/Orbits: No fluid levels or advanced mucosal thickening of
the visualized paranasal sinuses. No mastoid or middle ear effusion.
The orbits are normal.

CT CERVICAL SPINE FINDINGS

Alignment: No static subluxation. Facets are aligned. Occipital
condyles are normally positioned.

Skull base and vertebrae: No acute fracture.

Soft tissues and spinal canal: No prevertebral fluid or swelling. No
visible canal hematoma.

Disc levels: No advanced spinal canal or neural foraminal stenosis.
Fusion of left C3-4 facets.

Upper chest: No pneumothorax, pulmonary nodule or pleural effusion.

Other: Normal visualized paraspinal cervical soft tissues.
IMPRESSION: 1. No acute intracranial abnormality.
2. Old right caudate head small vessel infarct.
3. No acute fracture or static subluxation of the cervical spine.

## 2020-03-08 MED ORDER — ACETAMINOPHEN 325 MG PO TABS
650.0000 mg | ORAL_TABLET | Freq: Once | ORAL | Status: AC
  Administered 2020-03-08: 650 mg via ORAL
  Filled 2020-03-08: qty 2

## 2020-03-08 MED ORDER — LIDOCAINE-EPINEPHRINE (PF) 2 %-1:200000 IJ SOLN
20.0000 mL | Freq: Once | INTRAMUSCULAR | Status: AC
  Administered 2020-03-08: 20 mL
  Filled 2020-03-08: qty 20
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
## 2020-03-08 NOTE — Discharge Instructions (Addendum)
Keep your wounds clean and dry.  Do use antibiotic ointment on the wounds to help prevent infection.  The staples need to be removed in 1 week, that can be done at your primary care doctor's office or in the emergency department.  You can use ice packs for the swelling at the back of your head.  You can safely take acetaminophen for pain as needed.  Return to the emergency department for any problems listed on the head injury sheet.

## 2020-03-08 NOTE — ED Triage Notes (Signed)
Pt in by [facility] after a fall at home.  Pt fell backward out of her wheelchair and hit her head on the floor, has swelling and bleeding from the back of her head.  Pt denies loc.  Pt also has a skin tear to her right forearm.

## 2020-03-08 NOTE — ED Notes (Signed)
Pts family contacted and updated on Pts status. Pts daughter agreed to provide Pt transport home following discharge. MD currently at bedside giving bedside discharge instructions for staple removal in 1 week.

## 2020-03-15 ENCOUNTER — Ambulatory Visit: Payer: Medicare Other | Admitting: Family Medicine

## 2020-03-15 ENCOUNTER — Other Ambulatory Visit: Payer: Self-pay

## 2020-04-25 ENCOUNTER — Emergency Department (HOSPITAL_COMMUNITY): Payer: Medicare Other

## 2020-04-25 ENCOUNTER — Emergency Department (HOSPITAL_COMMUNITY)
Admission: EM | Admit: 2020-04-25 | Discharge: 2020-04-26 | Disposition: A | Discharge status: Home / Self Care | Payer: Medicare Other | Attending: Emergency Medicine | Admitting: Emergency Medicine

## 2020-04-25 ENCOUNTER — Encounter (HOSPITAL_COMMUNITY): Payer: Self-pay | Admitting: Emergency Medicine

## 2020-04-25 ENCOUNTER — Other Ambulatory Visit: Payer: Self-pay

## 2020-04-25 DIAGNOSIS — Z87891 Personal history of nicotine dependence: Secondary | ICD-10-CM | POA: Insufficient documentation

## 2020-04-25 DIAGNOSIS — R2242 Localized swelling, mass and lump, left lower limb: Secondary | ICD-10-CM | POA: Diagnosis not present

## 2020-04-25 DIAGNOSIS — Z7982 Long term (current) use of aspirin: Secondary | ICD-10-CM | POA: Diagnosis not present

## 2020-04-25 DIAGNOSIS — I2510 Atherosclerotic heart disease of native coronary artery without angina pectoris: Secondary | ICD-10-CM | POA: Diagnosis not present

## 2020-04-25 DIAGNOSIS — I10 Essential (primary) hypertension: Secondary | ICD-10-CM | POA: Insufficient documentation

## 2020-04-25 DIAGNOSIS — M79605 Pain in left leg: Secondary | ICD-10-CM | POA: Insufficient documentation

## 2020-04-25 DIAGNOSIS — Z79899 Other long term (current) drug therapy: Secondary | ICD-10-CM | POA: Insufficient documentation

## 2020-04-25 DIAGNOSIS — R600 Localized edema: Secondary | ICD-10-CM

## 2020-04-25 IMAGING — DX DG TIBIA/FIBULA 2V*L*
4 series · 4 of 4 positions shown · non-contrast
Comparison: Radiographs [DATE], CT [DATE]

CLINICAL DATA: Chronic swelling and wound for 1 month

EXAM:
LEFT TIBIA AND FIBULA - 2 VIEW

[tibia ap (1 of 2)]
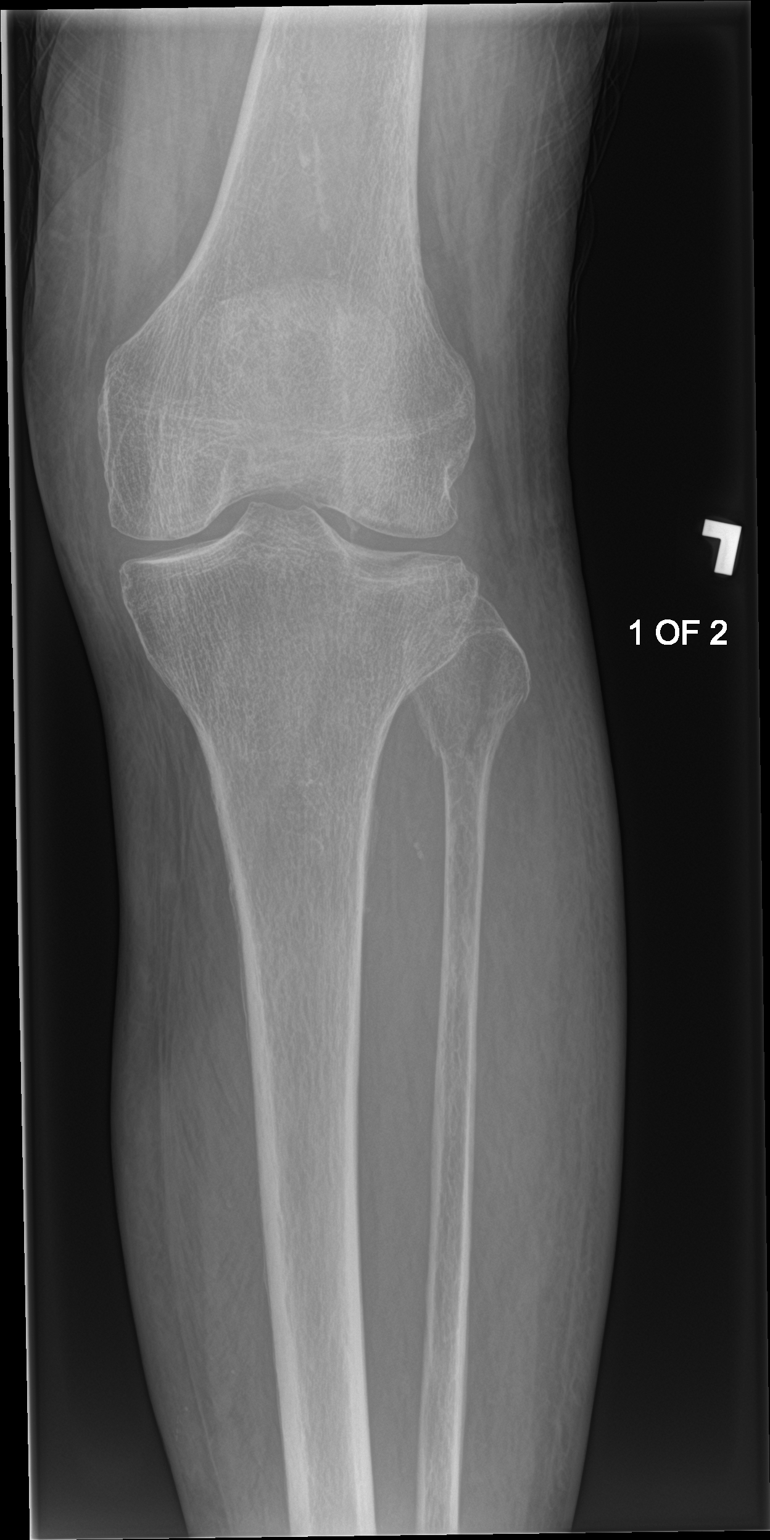

[tibia ap (2 of 2)]
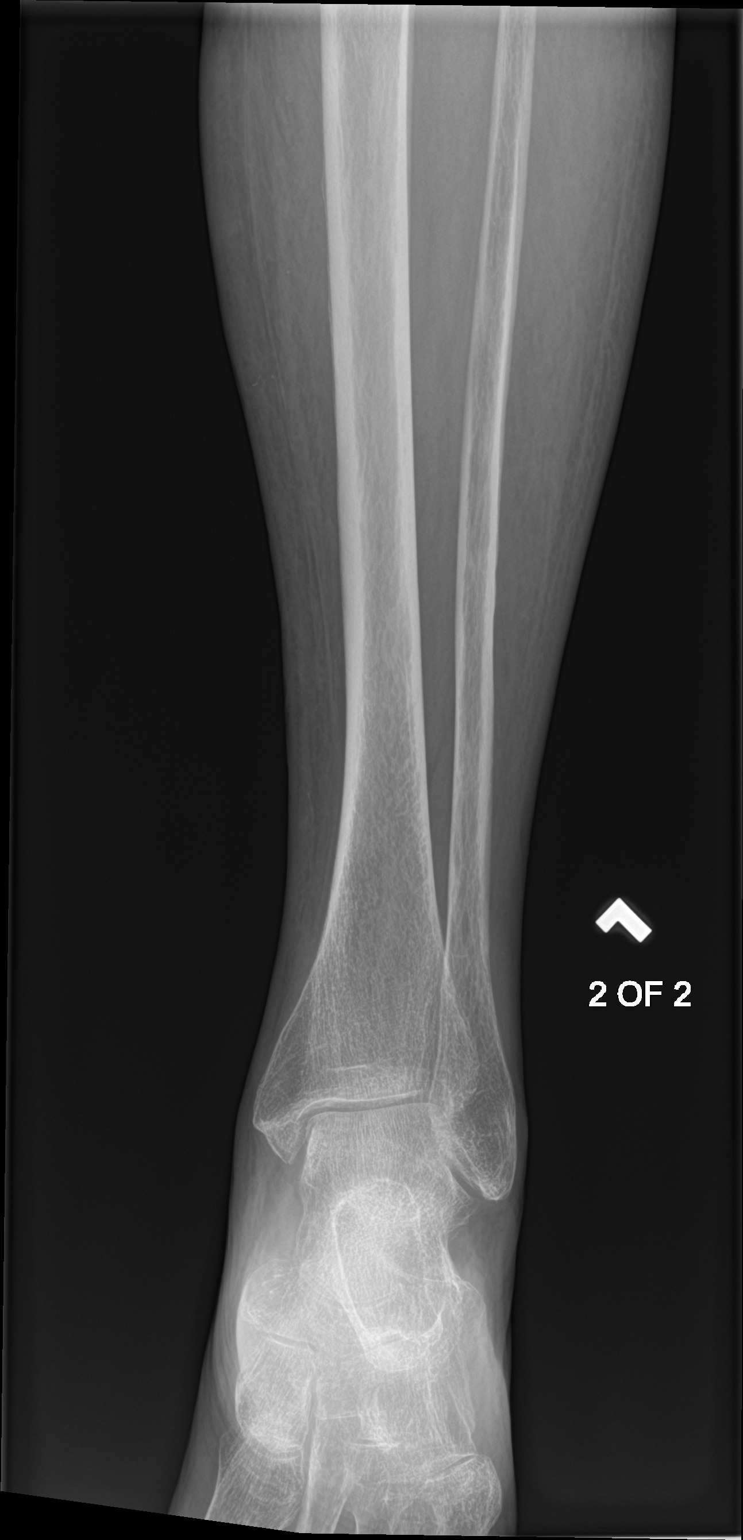

[tibia lat (1 of 2)]
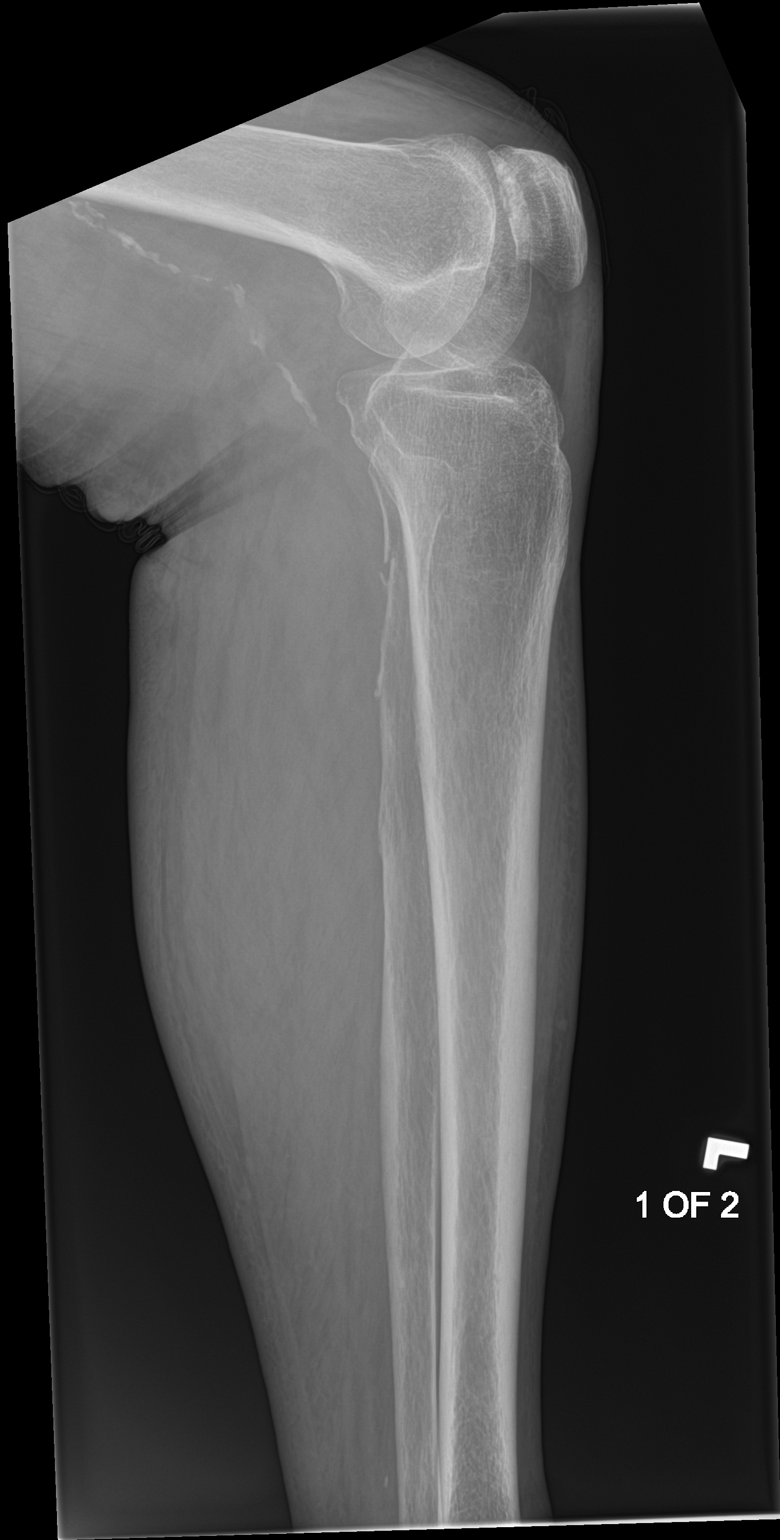

[tibia lat (2 of 2)]
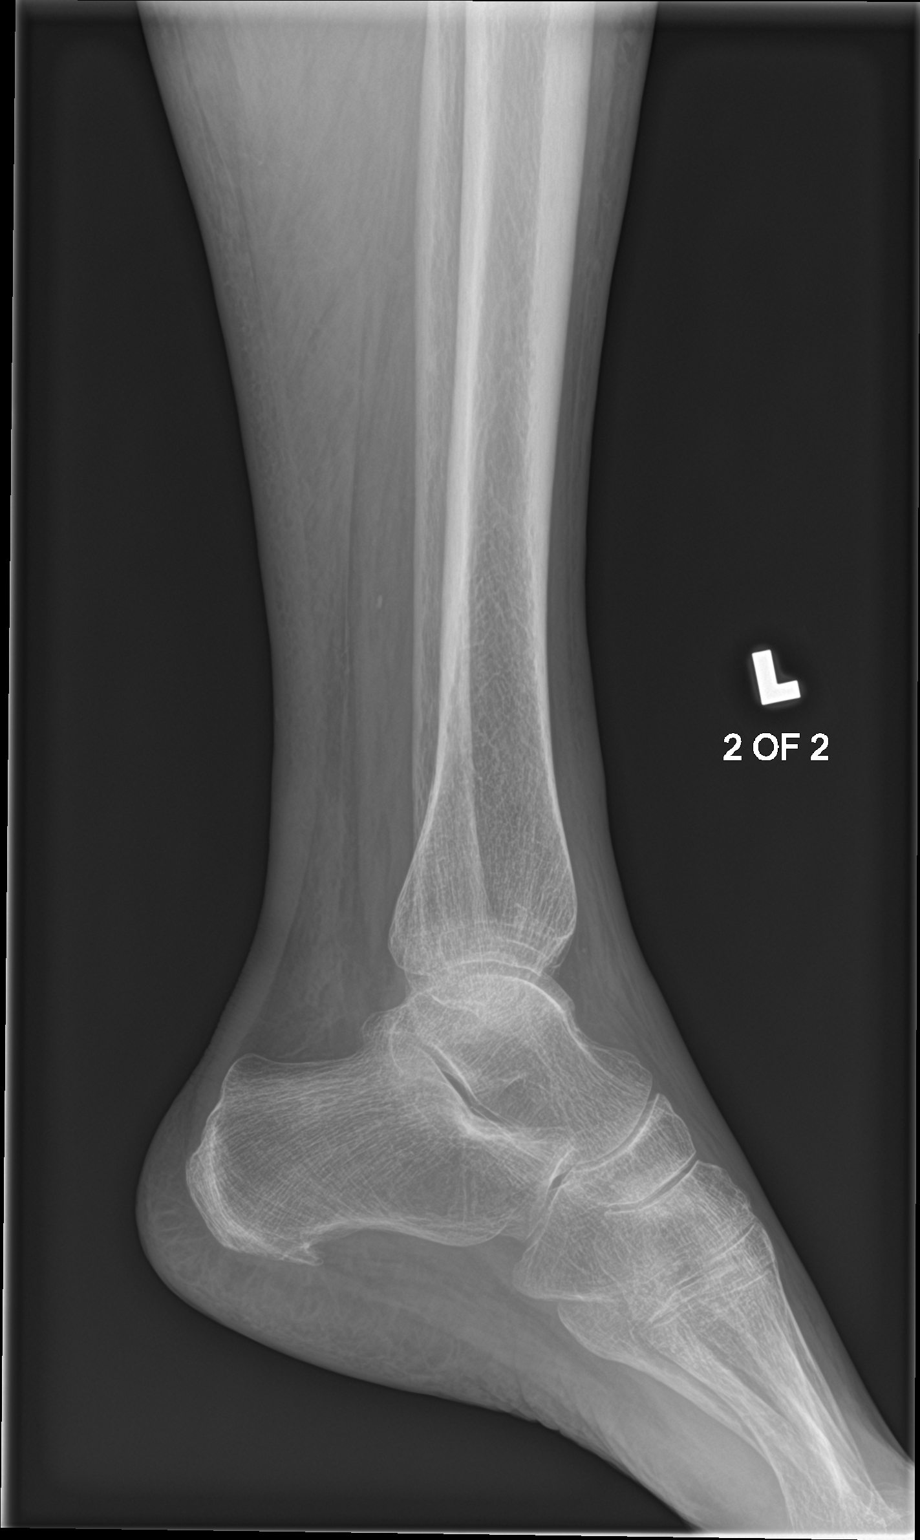

[4 of 4 positions shown; findings below may reference images not displayed]

FINDINGS: The osseous structures appear diffusely demineralized which may
limit detection of small or nondisplaced fractures. There is a
minimally displaced fracture of proximal fibula. A small
transcortical lucency is seen along the medial tibial plateau as
well without significant articular surface step-off which may
correlate with reported acute tibial plateau fracture. There is
remote posttraumatic deformity of the lateral tibial plateau
compatible with injury seen on comparison imaging from [2T]. No
other acute osseous abnormality is seen. Degenerative changes are
present at the ankle and knee. There is diffuse circumferential soft
tissue swelling of the left lower leg without radiographically
evident site of ulceration, soft tissue gas or foreign body. No
visible osseous erosions or destructive changes nor immature
periostitis is present to suggest a site of radiographically evident
osteomyelitis. Vascular calcium posterior to the knee.
IMPRESSION: 1. Minimally displaced fracture of the proximal fibula.
2. Cortical lucency along the medial tibial plateau without
significant articular surface step-off which may correlate with
reported acute tibial plateau fracture. More remote posttraumatic
deformity of the lateral tibial plateau as well.
3. Diffuse circumferential soft tissue swelling of the left lower
leg without radiographically evident site of ulceration, soft tissue
gas or foreign body. No convincing early radiographic features of
osteomyelitis.

## 2020-04-25 NOTE — Discharge Instructions (Addendum)
Follow-up with your previous orthopedic surgeons would be best.  There is evidence of an acute proximal fibula fracture probably from the fall last week.  Questionable tibial plateau fracture.  If you choose not to follow-up with them you could consider trying to follow-up with Dr. Aline Brochure but they usually prefer you stay with the same orthopedic group.  For the oozing wounds.  Will give you a referral to general surgery maybe they can help with some of the wound care.  As we discussed I think a lot of it has to do with the edema in the leg.  Not so much a cellulitis or infection.  Continue to apply the Silvadene.  Return for any new or worse symptoms.
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
## 2020-04-25 NOTE — ED Triage Notes (Signed)
Pt complains of leg pain for the last month with a weeping wound. Patient was seen at urgent care on 03/21/2020 for the same and prescribed doxycycline without relief.

## 2020-05-04 DIAGNOSIS — T1490XA Injury, unspecified, initial encounter: Secondary | ICD-10-CM

## 2020-05-04 DIAGNOSIS — R778 Other specified abnormalities of plasma proteins: Secondary | ICD-10-CM

## 2020-05-04 DIAGNOSIS — R269 Unspecified abnormalities of gait and mobility: Secondary | ICD-10-CM

## 2020-05-04 DIAGNOSIS — D62 Acute posthemorrhagic anemia: Secondary | ICD-10-CM

## 2020-05-04 DIAGNOSIS — R234 Changes in skin texture: Secondary | ICD-10-CM

## 2020-05-04 DIAGNOSIS — S3210XA Unspecified fracture of sacrum, initial encounter for closed fracture: Secondary | ICD-10-CM

## 2020-05-04 DIAGNOSIS — R339 Retention of urine, unspecified: Secondary | ICD-10-CM

## 2020-05-04 DIAGNOSIS — R7989 Other specified abnormal findings of blood chemistry: Secondary | ICD-10-CM

## 2020-05-04 DIAGNOSIS — Z89611 Acquired absence of right leg above knee: Secondary | ICD-10-CM

## 2020-05-04 DIAGNOSIS — S78111A Complete traumatic amputation at level between right hip and knee, initial encounter: Secondary | ICD-10-CM

## 2020-05-04 DIAGNOSIS — E8809 Other disorders of plasma-protein metabolism, not elsewhere classified: Secondary | ICD-10-CM

## 2020-05-04 DIAGNOSIS — G546 Phantom limb syndrome with pain: Secondary | ICD-10-CM

## 2020-05-04 HISTORY — DX: Unspecified fracture of sacrum, initial encounter for closed fracture: S32.10XA

## 2020-05-04 HISTORY — DX: Injury, unspecified, initial encounter: T14.90XA

## 2020-05-04 HISTORY — DX: Acute posthemorrhagic anemia: D62

## 2020-05-04 HISTORY — DX: Other specified abnormal findings of blood chemistry: R79.89

## 2020-05-28 ENCOUNTER — Ambulatory Visit (INDEPENDENT_AMBULATORY_CARE_PROVIDER_SITE_OTHER): Payer: Medicare Other | Admitting: Podiatry

## 2020-05-28 ENCOUNTER — Other Ambulatory Visit: Payer: Self-pay

## 2020-05-28 ENCOUNTER — Encounter: Payer: Self-pay | Admitting: Podiatry

## 2020-05-28 DIAGNOSIS — B351 Tinea unguium: Secondary | ICD-10-CM | POA: Diagnosis not present

## 2020-05-28 DIAGNOSIS — M79675 Pain in left toe(s): Secondary | ICD-10-CM

## 2020-05-28 DIAGNOSIS — M79674 Pain in right toe(s): Secondary | ICD-10-CM

## 2020-05-28 DIAGNOSIS — Z7901 Long term (current) use of anticoagulants: Secondary | ICD-10-CM

## 2020-05-28 DIAGNOSIS — I739 Peripheral vascular disease, unspecified: Secondary | ICD-10-CM | POA: Diagnosis not present

## 2020-05-29 ENCOUNTER — Encounter (HOSPITAL_BASED_OUTPATIENT_CLINIC_OR_DEPARTMENT_OTHER): Payer: Medicare Other | Attending: Physician Assistant | Admitting: Physician Assistant

## 2020-05-29 DIAGNOSIS — Z7901 Long term (current) use of anticoagulants: Secondary | ICD-10-CM | POA: Diagnosis not present

## 2020-05-29 DIAGNOSIS — I4891 Unspecified atrial fibrillation: Secondary | ICD-10-CM | POA: Insufficient documentation

## 2020-05-29 DIAGNOSIS — Z89611 Acquired absence of right leg above knee: Secondary | ICD-10-CM | POA: Diagnosis not present

## 2020-05-29 DIAGNOSIS — I480 Paroxysmal atrial fibrillation: Secondary | ICD-10-CM | POA: Diagnosis not present

## 2020-05-29 DIAGNOSIS — I7389 Other specified peripheral vascular diseases: Secondary | ICD-10-CM | POA: Diagnosis not present

## 2020-05-29 DIAGNOSIS — L97822 Non-pressure chronic ulcer of other part of left lower leg with fat layer exposed: Secondary | ICD-10-CM | POA: Diagnosis not present

## 2020-05-29 DIAGNOSIS — I872 Venous insufficiency (chronic) (peripheral): Secondary | ICD-10-CM | POA: Diagnosis not present
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
## 2020-05-30 ENCOUNTER — Other Ambulatory Visit: Payer: Self-pay

## 2020-05-30 ENCOUNTER — Inpatient Hospital Stay (HOSPITAL_BASED_OUTPATIENT_CLINIC_OR_DEPARTMENT_OTHER): Payer: Medicare Other | Admitting: Hematology

## 2020-05-30 ENCOUNTER — Encounter: Payer: Self-pay | Admitting: Hematology

## 2020-05-30 ENCOUNTER — Inpatient Hospital Stay: Payer: Medicare Other | Attending: Hematology

## 2020-05-30 VITALS — BP 152/60 | HR 71 | Temp 97.5°F | Resp 18 | Ht 65.0 in | Wt 149.3 lb

## 2020-05-30 DIAGNOSIS — Z89611 Acquired absence of right leg above knee: Secondary | ICD-10-CM | POA: Diagnosis not present

## 2020-05-30 DIAGNOSIS — D45 Polycythemia vera: Secondary | ICD-10-CM

## 2020-05-30 DIAGNOSIS — I252 Old myocardial infarction: Secondary | ICD-10-CM | POA: Diagnosis not present

## 2020-05-30 DIAGNOSIS — I10 Essential (primary) hypertension: Secondary | ICD-10-CM | POA: Diagnosis not present

## 2020-05-30 DIAGNOSIS — I2510 Atherosclerotic heart disease of native coronary artery without angina pectoris: Secondary | ICD-10-CM | POA: Insufficient documentation

## 2020-05-30 DIAGNOSIS — D649 Anemia, unspecified: Secondary | ICD-10-CM | POA: Diagnosis not present

## 2020-05-30 DIAGNOSIS — Z7982 Long term (current) use of aspirin: Secondary | ICD-10-CM | POA: Diagnosis not present

## 2020-05-30 DIAGNOSIS — Z7901 Long term (current) use of anticoagulants: Secondary | ICD-10-CM | POA: Insufficient documentation

## 2020-05-30 DIAGNOSIS — I480 Paroxysmal atrial fibrillation: Secondary | ICD-10-CM | POA: Insufficient documentation

## 2020-05-30 DIAGNOSIS — E785 Hyperlipidemia, unspecified: Secondary | ICD-10-CM | POA: Diagnosis not present

## 2020-05-30 DIAGNOSIS — Z79899 Other long term (current) drug therapy: Secondary | ICD-10-CM | POA: Insufficient documentation

## 2020-05-30 DIAGNOSIS — Z8673 Personal history of transient ischemic attack (TIA), and cerebral infarction without residual deficits: Secondary | ICD-10-CM | POA: Insufficient documentation

## 2020-05-30 DIAGNOSIS — Z86718 Personal history of other venous thrombosis and embolism: Secondary | ICD-10-CM | POA: Diagnosis not present

## 2020-05-30 LAB — CBC WITH DIFFERENTIAL (CANCER CENTER ONLY)
Abs Immature Granulocytes: 0.11 10*3/uL — ABNORMAL HIGH (ref 0.00–0.07)
Basophils Absolute: 0.1 10*3/uL (ref 0.0–0.1)
Basophils Relative: 1 %
Eosinophils Absolute: 0.3 10*3/uL (ref 0.0–0.5)
Eosinophils Relative: 2 %
HCT: 38.9 % (ref 36.0–46.0)
Hemoglobin: 11.1 g/dL — ABNORMAL LOW (ref 12.0–15.0)
Immature Granulocytes: 1 %
Lymphocytes Relative: 7 %
Lymphs Abs: 1 10*3/uL (ref 0.7–4.0)
MCH: 22.6 pg — ABNORMAL LOW (ref 26.0–34.0)
MCHC: 28.5 g/dL — ABNORMAL LOW (ref 30.0–36.0)
MCV: 79.2 fL — ABNORMAL LOW (ref 80.0–100.0)
Monocytes Absolute: 0.4 10*3/uL (ref 0.1–1.0)
Monocytes Relative: 3 %
Neutro Abs: 11.3 10*3/uL — ABNORMAL HIGH (ref 1.7–7.7)
Neutrophils Relative %: 86 %
Platelet Count: 356 10*3/uL (ref 150–400)
RBC: 4.91 MIL/uL (ref 3.87–5.11)
RDW: 20.1 % — ABNORMAL HIGH (ref 11.5–15.5)
WBC Count: 13.1 10*3/uL — ABNORMAL HIGH (ref 4.0–10.5)
nRBC: 0 % (ref 0.0–0.2)

## 2020-05-30 LAB — FERRITIN: Ferritin: 14 ng/mL (ref 11–307)

## 2020-05-30 NOTE — Progress Notes (Signed)
[name], [name] ([id number]) Visit Report for 05/29/2020 Abuse/Suicide Risk Screen Details Patient Name: Date of Service: [place], [name] 05/29/2020 2:45 PM Medical Record Number: [id number] Patient Account Number: [id number] Date of Birth/Sex: Treating RN: May 29, 1951 (69 y.o. Nancy Fetter Primary Care Drevin Ortner: Jori Moll, Karsten Fells Other Clinician: Referring Lesia Monica: Treating Antwon Rochin/Extender: Oliver Pila, BRITNEY Weeks in Treatment: 0 Abuse/Suicide Risk Screen Items Answer ABUSE RISK SCREEN: Has anyone close to you tried to hurt or harm you recentlyo No Do you feel uncomfortable with anyone in your familyo No Has anyone forced you do things that you didnt want to doo No Electronic Signature(s) Signed: 05/30/2020 5:18:16 PM By: [name], [name] Entered By: Levan Hurst on 05/29/2020 15:41:29 -------------------------------------------------------------------------------- Activities of Daily Living Details Patient Name: Date of Service: [place], [name] 05/29/2020 2:45 PM Medical Record Number: [id number] Patient Account Number: [id number] Date of Birth/Sex: Treating RN: 06-28-1951 (69 y.o. Nancy Fetter Primary Care Kirbie Stodghill: Jori Moll, Karsten Fells Other Clinician: Referring Othella Slappey: Treating Amanii Snethen/Extender: Oliver Pila, BRITNEY Weeks in Treatment: 0 Activities of Daily Living Items Answer Activities of Daily Living (Please select one for each item) Drive Automobile Not Able T Medications ake Completely Able Use T elephone Completely Able Care for Appearance Completely Able Use T oilet Completely Able Bath / Shower Completely Able Dress Self Completely Able Feed Self Completely Able Walk Completely Able Get In / Out Bed Completely Able Housework Need Assistance Prepare Meals Need Assistance Handle Money Completely Able Shop for Self Need Assistance Electronic Signature(s) Signed: 05/30/2020 5:18:16 PM By: [name], [name] Entered  By: Levan Hurst on 05/29/2020 15:42:01 -------------------------------------------------------------------------------- Education Screening Details Patient Name: Date of Service: [name], [name] 05/29/2020 2:45 PM Medical Record Number: [id number] Patient Account Number: [id number] Date of Birth/Sex: Treating RN: 06/18/1951 (69 y.o. Nancy Fetter Primary Care Chandy Tarman: Jori Moll, Karsten Fells Other Clinician: Referring Ashima Shrake: Treating Chayce Robbins/Extender: Baron Hamper Weeks in Treatment: 0 Primary Learner Assessed: Patient Learning Preferences/Education Level/Primary Language Learning Preference: Explanation Highest Education Level: High School Preferred Language: English Cognitive Barrier Language Barrier: No Translator Needed: No Memory Deficit: No Emotional Barrier: No Cultural/Religious Beliefs Affecting Medical Care: No Physical Barrier Impaired Vision: No Impaired Hearing: No Decreased Hand dexterity: No Knowledge/Comprehension Knowledge Level: High Comprehension Level: High Ability to understand written instructions: High Ability to understand verbal instructions: High Motivation Anxiety Level: Calm Cooperation: Cooperative Education Importance: Acknowledges Need Interest in Health Problems: Asks Questions Perception: Coherent Willingness to Engage in Self-Management High Activities: Readiness to Engage in Self-Management High Activities: Electronic Signature(s) Signed: 05/30/2020 5:18:16 PM By: [name], [name] Entered By: Levan Hurst on 05/29/2020 15:42:21 -------------------------------------------------------------------------------- Fall Risk Assessment Details Patient Name: Date of Service: [name], [name] 05/29/2020 2:45 PM Medical Record Number: [id number] Patient Account Number: [id number] Date of Birth/Sex: Treating RN: 1951/04/23 (69 y.o. Nancy Fetter Primary Care Toshia Larkin: Jori Moll, Karsten Fells Other  Clinician: Referring Julia Kulzer: Treating Jeffey Janssen/Extender: Oliver Pila, BRITNEY Weeks in Treatment: 0 Fall Risk Assessment Items Have you had 2 or more falls in the last 12 monthso 0 Yes Have you had any fall that resulted in injury in the last 12 monthso 0 Yes FALLS RISK SCREEN History of falling - immediate or within 3 months 25 Yes Secondary diagnosis (Do you have 2 or more medical diagnoseso) 15 Yes Ambulatory aid None/bed rest/wheelchair/nurse 0 No Crutches/cane/walker 15 Yes Furniture 0 No Intravenous therapy Access/Saline/Heparin [employer]  0 No Gait/Transferring Normal/ bed rest/ wheelchair 0 Yes Weak (short steps with or without shuffle, stooped but able to lift head while walking, may seek 0 No support from furniture) Impaired (short steps with shuffle, may have difficulty arising from chair, head down, impaired 0 No balance) Mental Status Oriented to own ability 0 Yes Electronic Signature(s) Signed: 05/30/2020 5:18:16 PM By: [name], [name] Entered By: Levan Hurst on 05/29/2020 15:42:38 -------------------------------------------------------------------------------- Foot Assessment Details Patient Name: Date of Service: [name], [place] Bruins B. 05/29/2020 2:45 PM Medical Record Number: [id number] Patient Account Number: [id number] Date of Birth/Sex: Treating RN: 10-17-1951 (69 y.o. Nancy Fetter Primary Care Oley Lahaie: Jori Moll, Karsten Fells Other Clinician: Referring Ameliarose Shark: Treating Chet Greenley/Extender: Oliver Pila, BRITNEY Weeks in Treatment: 0 Foot Assessment Items Site Locations + = Sensation present, - = Sensation absent, C = Callus, U = Ulcer R = Redness, W = Warmth, M = Maceration, PU = Pre-ulcerative lesion F = Fissure, S = Swelling, D = Dryness Assessment Right: Left: Other Deformity: No No Prior Foot Ulcer: No No Prior Amputation: No No Charcot Joint: No No Ambulatory Status: Ambulatory With Help Assistance Device: Walker GaitElectrical engineer) Signed: 05/30/2020 5:18:16 PM By: [name], [name] Entered By: Levan Hurst on 05/29/2020 15:43:26 -------------------------------------------------------------------------------- Nutrition Risk Screening Details Patient Name: Date of Service: [name], [place] Bruins B. 05/29/2020 2:45 PM Medical Record Number: [id number] Patient Account Number: [id number] Date of Birth/Sex: Treating RN: 06/05/1951 (69 y.o. Nancy Fetter Primary Care Jewell Haught: Jori Moll, Karsten Fells Other Clinician: Referring Curlee Bogan: Treating Saroya Riccobono/Extender: Oliver Pila, BRITNEY Weeks in Treatment: 0 Height (in): 65 Weight (lbs): 144 Body Mass Index (BMI): 24 Nutrition Risk Screening Items Score Screening NUTRITION RISK SCREEN: I have an illness or condition that made me change the kind and/or amount of food I eat 0 No I eat fewer than two meals per day 0 No I eat few fruits and vegetables, or milk products 0 No I have three or more drinks of beer, liquor or wine almost every day 0 No I have tooth or mouth problems that make it hard for me to eat 0 No I don't always have enough money to buy the food I need 0 No I eat alone most of the time 0 No I take three or more different prescribed or over-the-counter drugs a day 1 Yes Without wanting to, I have lost or gained 10 pounds in the last six months 0 No I am not always physically able to shop, cook and/or feed myself 2 Yes Nutrition Protocols Good Risk Protocol Moderate Risk Protocol 0 Provide education on nutrition High Risk Proctocol Risk Level: Moderate Risk Score: 3 Electronic Signature(s) Signed: 05/30/2020 5:18:16 PM By: [name], [name] Entered By: Levan Hurst on 05/29/2020 15:43:03

## 2020-05-30 NOTE — Progress Notes (Signed)
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 (peripheral) L97.822 Non-pressure chronic ulcer of other part of left lower leg with fat layer exposed I73.89  Other specified peripheral vascular diseases 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

## 2020-05-31 ENCOUNTER — Telehealth: Payer: Self-pay | Admitting: Hematology

## 2020-05-31 NOTE — Telephone Encounter (Signed)
Scheduled appt per 6/10 los.  Spoke with pt and she is aware of the appt date and time. 

## 2020-06-02 NOTE — Progress Notes (Signed)
Subjective: 69 y.o. returns the office today for painful, elongated, thickened toenails which she cannot trim herself. Denies any redness or drainage around the nails.  She is currently going to the wound care center for a wound on the left leg.  Leg is wrapped today.  She is going to wound care for follow-up tomorrow.  Denies any acute changes since last appointment and no new complaints today. Denies any systemic complaints such as fevers, chills, nausea, vomiting.   PCP: Loman Brooklyn, FNP  Objective: AAO 3, NAD DP/PT pulses decreased Protective sensation decreased with Simms Weinstein monofilament Nails hypertrophic, dystrophic, elongated, brittle, discolored 5. There is tenderness overlying the nails 1-5 on the left. There is no surrounding erythema or drainage along the nail sites. Bandage to left leg intact No other areas of tenderness bilateral lower extremities. No overlying edema, erythema, increased warmth. No pain with calf compression, swelling, warmth, erythema.  Assessment: Patient presents with symptomatic onychomycosis; PAD on chronic anticoagulation  Plan: -Treatment options including alternatives, risks, complications were discussed -Nails sharply debrided x 5 without complication/bleeding. -We will refer to the wound care center for treatment of the left leg -Discussed daily foot inspection. If there are any changes, to call the office immediately.  -Follow-up in 3 months or sooner if any problems are to arise. In the meantime, encouraged to call the office with any questions, concerns, changes symptoms.  Celesta Gentile, DPM

## 2020-06-05 ENCOUNTER — Other Ambulatory Visit: Payer: Self-pay

## 2020-06-05 ENCOUNTER — Encounter (HOSPITAL_BASED_OUTPATIENT_CLINIC_OR_DEPARTMENT_OTHER): Payer: Medicare Other | Admitting: Physician Assistant

## 2020-06-05 DIAGNOSIS — L97822 Non-pressure chronic ulcer of other part of left lower leg with fat layer exposed: Secondary | ICD-10-CM | POA: Diagnosis not present

## 2020-06-05 NOTE — Progress Notes (Signed)
[name], [name] ([id number]) Visit Report for 05/29/2020 Allergy List Details Patient Name: Date of Service: [name], [name] 05/29/2020 2:45 PM Medical Record Number: [id number] Patient Account Number: [id number] Date of Birth/Sex: Treating RN: 1951/11/17 (69 y.o. [name] Primary Care Mahaley Schwering: Jori Moll, Karsten Fells Other Clinician: Referring Obediah Welles: Treating Janne Faulk/Extender: Oliver Pila, BRITNEY Weeks in Treatment: 0 Allergies Active Allergies penicillin Reaction: hives Severity: Moderate Plavix Severity: Moderate Demerol Severity: Moderate Percocet Severity: Mild Effexor Inderal LA Severity: Moderate atenolol Crestor Trilipix simvastatin Severity: Moderate Lovaza Severity: Moderate warfarin Nexium Lipitor prednisone pravastatin hydralazine gabapentin Allergy Notes Electronic Signature(s) Signed: 05/30/2020 5:18:16 PM By: [name], [name] Entered By: Levan Hurst on 05/29/2020 15:29:54 -------------------------------------------------------------------------------- Arrival Information Details Patient Name: Date of Service: [place] [name], [name] 05/29/2020 2:45 PM Medical Record Number: [id number] Patient Account Number: [id number] Date of Birth/Sex: Treating RN: [birth year] (69 y.o. [name] Primary Care Nickalaus Crooke: Jori Moll, Karsten Fells Other Clinician: Referring Bodi Palmeri: Treating Kearney Evitt/Extender: Oliver Pila, BRITNEY Weeks in Treatment: 0 Visit Information Patient Arrived: Walker Arrival Time: 15:23 Accompanied By: alone Transfer Assistance: None Patient Identification Verified: Yes Secondary Verification Process Completed: Yes Patient Requires Transmission-Based Precautions: No Patient Has Alerts: Yes Patient Alerts: Patient on Blood Thinner L ABI non compressible History Since Last Visit Added or deleted any medications: No Any new allergies or adverse reactions: No Had a fall or experienced change in activities  of daily living that may affect risk of falls: No Signs or symptoms of abuse/neglect since last visito No Hospitalized since last visit: No Implantable device outside of the clinic excluding cellular tissue based products placed in the center since last visit: No Electronic Signature(s) Signed: 05/30/2020 5:18:16 PM By: [name], [name] Entered By: Levan Hurst on 05/29/2020 15:51:50 -------------------------------------------------------------------------------- Clinic Level of Care Assessment Details Patient Name: Date of Service: [name], [name] 05/29/2020 2:45 PM Medical Record Number: [id number] Patient Account Number: [id number] Date of Birth/Sex: Treating RN: 01/29/1951 (68 y.o. [name] Primary Care Kenon Delashmit: Jori Moll, Karsten Fells Other Clinician: Referring Sears Oran: Treating Roby Donaway/Extender: Oliver Pila, BRITNEY Weeks in Treatment: 0 Clinic Level of Care Assessment Items TOOL 2 Quantity Score []  - 0 Use when only an EandM is performed on the INITIAL visit ASSESSMENTS - Nursing Assessment / Reassessment X- 1 20 General Physical Exam (combine w/ comprehensive assessment (listed just below) when performed on new pt. evals) X- 1 25 Comprehensive Assessment (HX, ROS, Risk Assessments, Wounds Hx, etc.) ASSESSMENTS - Wound and Skin A ssessment / Reassessment []  - 0 Simple Wound Assessment / Reassessment - one wound X- 2 5 Complex Wound Assessment / Reassessment - multiple wounds []  - 0 Dermatologic / Skin Assessment (not related to wound area) ASSESSMENTS - Ostomy and/or Continence Assessment and Care []  - 0 Incontinence Assessment and Management []  - 0 Ostomy Care Assessment and Management (repouching, etc.) PROCESS - Coordination of Care X - Simple Patient / Family Education for ongoing care 1 15 []  - 0 Complex (extensive) Patient / Family Education for ongoing care X- 1 10 Staff obtains Programmer, systems, Records, T Results / Process Orders est []  -  0 Staff telephones HHA, Nursing Homes / Clarify orders / etc []  - 0 Routine Transfer to another Facility (non-emergent condition) []  - 0 Routine Hospital Admission (non-emergent condition) X- 1 15 New Admissions / Biomedical engineer / Ordering NPWT Apligraf, etc. , []  - 0 Emergency Hospital Admission (emergent condition) X- 1 10 Simple  Discharge Coordination []  - 0 Complex (extensive) Discharge Coordination PROCESS - Special Needs []  - 0 Pediatric / Minor Patient Management []  - 0 Isolation Patient Management []  - 0 Hearing / Language / Visual special needs []  - 0 Assessment of Community assistance (transportation, D/C planning, etc.) []  - 0 Additional assistance / Altered mentation []  - 0 Support Surface(s) Assessment (bed, cushion, seat, etc.) INTERVENTIONS - Wound Cleansing / Measurement X- 1 5 Wound Imaging (photographs - any number of wounds) []  - 0 Wound Tracing (instead of photographs) []  - 0 Simple Wound Measurement - one wound X- 2 5 Complex Wound Measurement - multiple wounds []  - 0 Simple Wound Cleansing - one wound X- 2 5 Complex Wound Cleansing - multiple wounds INTERVENTIONS - Wound Dressings X - Small Wound Dressing one or multiple wounds 2 10 []  - 0 Medium Wound Dressing one or multiple wounds []  - 0 Large Wound Dressing one or multiple wounds []  - 0 Application of Medications - injection INTERVENTIONS - Miscellaneous []  - 0 External ear exam []  - 0 Specimen Collection (cultures, biopsies, blood, body fluids, etc.) []  - 0 Specimen(s) / Culture(s) sent or taken to Lab for analysis []  - 0 Patient Transfer (multiple staff / Civil Service fast streamer / Similar devices) []  - 0 Simple Staple / Suture removal (25 or less) []  - 0 Complex Staple / Suture removal (26 or more) []  - 0 Hypo / Hyperglycemic Management (close monitor of Blood Glucose) X- 1 15 Ankle / Brachial Index (ABI) - do not check if billed separately Has the patient been seen at the  hospital within the last three years: Yes Total Score: 165 Level Of Care: New/Established - Level 5 Electronic Signature(s) Signed: 05/29/2020 6:08:32 PM By: Baruch Gouty RN, [name] Entered By: Baruch Gouty on 05/29/2020 16:16:57 -------------------------------------------------------------------------------- Encounter Discharge Information Details Patient Name: Date of Service: [place] [name], [name] 05/29/2020 2:45 PM Medical Record Number: [id number] Patient Account Number: [id number] Date of Birth/Sex: Treating RN: 1951-10-27 (69 y.o. [name] Primary Care Yaniah Thiemann: Jori Moll, Karsten Fells Other Clinician: Referring Trishia Cuthrell: Treating Joeann Steppe/Extender: Baron Hamper Weeks in Treatment: 0 Encounter Discharge Information Items Discharge Condition: Stable Ambulatory Status: Walker Discharge Destination: Home Transportation: Private Auto Accompanied By: self Schedule Follow-up Appointment: Yes Clinical Summary of Care: Patient Declined Electronic Signature(s) Signed: 06/05/2020 9:55:31 AM By: Carlene Coria RN Entered By: Carlene Coria on 05/29/2020 16:37:40 -------------------------------------------------------------------------------- Lower Extremity Assessment Details Patient Name: Date of Service: [place], [name] 05/29/2020 2:45 PM Medical Record Number: [id number] Patient Account Number: [id number] Date of Birth/Sex: Treating RN: 1951/03/01 (69 y.o. [name] Primary Care Brodie Correll: Jori Moll, Karsten Fells Other Clinician: Referring Tahirih Lair: Treating Alfreida Steffenhagen/Extender: Oliver Pila, BRITNEY Weeks in Treatment: 0 Edema Assessment Assessed: [Left: No] [Right: No] Edema: [Left: Ye] [Right: s] Calf Left: Right: Point of Measurement: 28 cm From Medial Instep 34 cm cm Ankle Left: Right: Point of Measurement: 9 cm From Medial Instep 20 cm cm Vascular Assessment Pulses: Dorsalis Pedis Palpable: [Left:No] Doppler Audible:  [Left:Inaudible] Posterior Tibial Palpable: [Left:No Yes] Electronic Signature(s) Signed: 05/30/2020 5:18:16 PM By: [name], [name] Entered By: Levan Hurst on 05/29/2020 15:48:23 -------------------------------------------------------------------------------- [place] Details Patient Name: Date of Service: [place] [name], [name] 05/29/2020 2:45 PM Medical Record Number: [id number] Patient Account Number: [id number] Date of Birth/Sex: Treating RN: [birth year] (69 y.o. Helene Shoe, Meta.Reding Primary Care Tatiyanna Lashley: Jori Moll, Karsten Fells Other Clinician: Referring Yolunda Kloos: Treating Kanishk Stroebel/Extender: Oliver Pila,  BRITNEY Weeks in Treatment: 0 Active Inactive Orientation to the Wound Care Program Nursing Diagnoses: Knowledge deficit related to the wound healing center program Goals: Patient/caregiver will verbalize understanding of the [place] Date Initiated: 05/29/2020 Target Resolution Date: 06/28/2020 Goal Status: Active Interventions: Provide education on orientation to the wound center Notes: Pain, Acute or Chronic Nursing Diagnoses: Pain, acute or chronic: actual or potential Potential alteration in comfort, pain Goals: Patient will verbalize adequate pain control and receive pain control interventions during procedures as needed Date Initiated: 05/29/2020 Target Resolution Date: 06/28/2020 Goal Status: Active Interventions: Provide education on pain management Reposition patient for comfort Treatment Activities: Administer pain control measures as ordered : 05/29/2020 Notes: Wound/Skin Impairment Nursing Diagnoses: Knowledge deficit related to ulceration/compromised skin integrity Goals: Patient/caregiver will verbalize understanding of skin care regimen Date Initiated: 05/29/2020 Target Resolution Date: 06/28/2020 Goal Status: Active Interventions: Assess patient/caregiver ability to perform ulcer/skin care regimen upon admission  and as needed Assess ulceration(s) every visit Provide education on ulcer and skin care Treatment Activities: Skin care regimen initiated : 05/29/2020 Topical wound management initiated : 05/29/2020 Notes: Electronic Signature(s) Signed: 05/29/2020 5:57:14 PM By: Deon Pilling Entered By: Deon Pilling on 05/29/2020 15:38:18 -------------------------------------------------------------------------------- Pain Assessment Details Patient Name: Date of Service: [place] [name], [name] 05/29/2020 2:45 PM Medical Record Number: [id number] Patient Account Number: [id number] Date of Birth/Sex: Treating RN: 1951-06-03 (69 y.o. [name] Primary Care Raphaela Cannaday: Jori Moll, Karsten Fells Other Clinician: Referring Karlie Aung: Treating Cori Justus/Extender: Oliver Pila, BRITNEY Weeks in Treatment: 0 Active Problems Location of Pain Severity and Description of Pain Patient Has Paino Yes Site Locations Pain Location: Pain in Ulcers With Dressing Change: Yes Duration of the Pain. Constant / Intermittento Intermittent Rate the pain. Current Pain Level: 4 Worst Pain Level: 10 Least Pain Level: 0 Character of Pain Describe the Pain: Burning Pain Management and Medication Current Pain Management: Medication: Yes Cold Application: No Rest: No Massage: No Activity: No T.E.N.S.: No Heat Application: No Leg drop or elevation: No Is the Current Pain Management Adequate: Adequate How does your wound impact your activities of daily livingo Sleep: No Bathing: No Appetite: No Relationship With Others: No Bladder Continence: No Emotions: No Bowel Continence: No Work: No Toileting: No Drive: No Dressing: No Hobbies: No Electronic Signature(s) Signed: 05/30/2020 5:18:16 PM By: [name], [name] Entered By: Levan Hurst on 05/29/2020 15:48:55 -------------------------------------------------------------------------------- Patient/Caregiver Education Details Patient Name: Date of  Service: [place] [name], Laureen Abrahams 6/9/2021andnbsp2:45 PM Medical Record Number: [id number] Patient Account Number: [id number] Date of Birth/Gender: Treating RN: Apr 12, 1951 (69 y.o. Helene Shoe, Meta.Reding Primary Care Physician: Jori Moll, Karsten Fells Other Clinician: Referring Physician: Treating Physician/Extender: Baron Hamper Weeks in Treatment: 0 Education Assessment Education Provided To: Patient Education Topics Provided [place]: o Handouts: Welcome T [facility] [place] o Methods: Explain/Verbal, Printed Responses: Reinforcements needed Electronic Signature(s) Signed: 05/29/2020 5:57:14 PM By: Deon Pilling Entered By: Deon Pilling on 05/29/2020 15:38:27 -------------------------------------------------------------------------------- Wound Assessment Details Patient Name: Date of Service: [place] [name], [name] 05/29/2020 2:45 PM Medical Record Number: [id number] Patient Account Number: [id number] Date of Birth/Sex: Treating RN: 1951-01-24 (69 y.o. [name] Primary Care Kasmira Cacioppo: Jori Moll, Karsten Fells Other Clinician: Referring Makynleigh Breslin: Treating Bev Drennen/Extender: Oliver Pila, BRITNEY Weeks in Treatment: 0 Wound Status Wound Number: 7 Primary Etiology: Venous Leg Ulcer Wound Location: Left, Anterior Lower Leg Wound Status: Open Wounding Event: Blister Date Acquired: 04/11/2020 Weeks Of  Treatment: 0 Clustered Wound: No Photos Photo Uploaded By: Mikeal Hawthorne on 06/04/2020 11:25:44 Wound Measurements Length: (cm) 0.7 Width: (cm) 0.6 Depth: (cm) 0.1 Area: (cm) 0.33 Volume: (cm) 0.033 % Reduction in Area: % Reduction in Volume: Epithelialization: None Tunneling: No Undermining: No Wound Description Classification: Full Thickness Without Exposed Support Struct Wound Margin: Flat and Intact Exudate Amount: Medium Exudate Type: Serosanguineous Exudate Color: red, brown ures Foul Odor After Cleansing: No Slough/Fibrino  Yes Wound Bed Granulation Amount: Large (67-100%) Exposed Structure Granulation Quality: Pink Fascia Exposed: No Necrotic Amount: Small (1-33%) Fat Layer (Subcutaneous Tissue) Exposed: Yes Necrotic Quality: Adherent Slough Tendon Exposed: No Muscle Exposed: No Joint Exposed: No Bone Exposed: No Treatment Notes Wound #7 (Left, Anterior Lower Leg) 1. Cleanse With Wound Cleanser Soap and water 3. Primary Dressing Applied Calcium Alginate Ag 4. Secondary Dressing Dry Gauze Notes tubi grip size E Electronic Signature(s) Signed: 05/30/2020 5:18:16 PM By: [name], [name] Entered By: Levan Hurst on 05/29/2020 15:37:33 -------------------------------------------------------------------------------- Wound Assessment Details Patient Name: Date of Service: [place] [name], [name] 05/29/2020 2:45 PM Medical Record Number: [id number] Patient Account Number: [id number] Date of Birth/Sex: Treating RN: [birth year] (69 y.o. [name] Primary Care Infant Zink: Jori Moll, Karsten Fells Other Clinician: Referring Divya Munshi: Treating Keldan Eplin/Extender: Oliver Pila, BRITNEY Weeks in Treatment: 0 Wound Status Wound Number: 8 Primary Etiology: Venous Leg Ulcer Wound Location: Left, Medial Lower Leg Wound Status: Open Wounding Event: Blister Date Acquired: 04/11/2020 Weeks Of Treatment: 0 Clustered Wound: Yes Photos Photo Uploaded By: Mikeal Hawthorne on 06/04/2020 11:25:45 Wound Measurements Length: (cm) 11.5 Width: (cm) 8 Depth: (cm) 0.1 Clustered Quantity: 10 Area: (cm) 72.257 Volume: (cm) 7.226 % Reduction in Area: % Reduction in Volume: Epithelialization: None Tunneling: No Undermining: No Wound Description Classification: Full Thickness Without Exposed Support Str Wound Margin: Flat and Intact Exudate Amount: Medium Exudate Type: Serous Exudate Color: amber uctures Foul Odor After Cleansing: No Slough/Fibrino Yes Wound Bed Granulation Amount: Medium (34-66%)  Exposed Structure Granulation Quality: Pink, Pale Fascia Exposed: No Necrotic Amount: Medium (34-66%) Fat Layer (Subcutaneous Tissue) Exposed: Yes Necrotic Quality: Adherent Slough Tendon Exposed: No Muscle Exposed: No Joint Exposed: No Bone Exposed: No Treatment Notes Wound #8 (Left, Medial Lower Leg) 1. Cleanse With Wound Cleanser Soap and water 3. Primary Dressing Applied Calcium Alginate Ag 4. Secondary Dressing Dry Gauze Notes tubi grip size E Electronic Signature(s) Signed: 05/30/2020 5:18:16 PM By: [name], [name] Entered By: Levan Hurst on 05/29/2020 15:38:22 -------------------------------------------------------------------------------- [place] Details Patient Name: Date of Service: [place] Stacey [place], [name] 05/29/2020 2:45 PM Medical Record Number: [id number] Patient Account Number: [id number] Date of Birth/Sex: Treating RN: 1951-05-20 (68 y.o. [name], Briant Cedar Primary Care Vilda Zollner: Jori Moll, Karsten Fells Other Clinician: Referring Jaleya Pebley: Treating Archibald Marchetta/Extender: Oliver Pila, BRITNEY Weeks in Treatment: 0 Vital Signs Time Taken: 15:28 Temperature (F): 99.0 Height (in): 65 Pulse (bpm): 67 Source: Stated Respiratory Rate (breaths/min): 16 Weight (lbs): 144 Blood Pressure (mmHg): 178/68 Source: Stated Reference Range: 80 - 120 mg / dl Body Mass Index (BMI): 24 Electronic Signature(s) Signed: 05/30/2020 5:18:16 PM By: [name], [name] Entered By: Levan Hurst on 05/29/2020 15:28:42

## 2020-06-05 NOTE — Progress Notes (Addendum)
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 healthier 1 week worth of Iodoflex. We do not have the tri-layer Oasis to place on this today. 2/3 Oasis #1 2/10; patient had some greenish drainage on the covering bandage. She also complained that the Steri-Strips were irritating. The wound does not look infected. We cleaned this up with Anasept removed some eschar from the wound circumference and applied Oasis #2 2/17; now turquoise green drainage over the bandage. Nonviable surface over the wound. I put the Oasis on hold back to Iodoflex. I did not culture the wound surface I am going to treat her empirically for Pseudomonas 2/25; no real change here. She is completed the antibiotics. She is still complaining some tenderness inferiorly but I do not see active cellulitis here. The wound surface has really gone back to nonviable material. I put her on Iodoflex last week. 3/3; surface looks better today with Iodoflex but no real change in measurements. No obvious evidence of infection 3/9; much better wound surface today. I graduated from Iodoflex to [facility] after an aggressive debridement 3/23; using Hydrofera Blue. Area of the wound is better. Patient is changing every second  day. 4/6; using Hydrofera Blue. The wound surface area continues to improve. Patient is changing this herself every second day. 4/20; using Hydrofera Blue. 2-week follow-up. Dimensions are better For 5/4; using Hydrofera Blue. 2-week follow-up. Dimensions are actually slightly longer in the surface of this is a very fibrinous gritty surface. Required debridement today. Also she has a small area inferiorly that she says was caused by scratching the wound in her sleep 5/11; using Hydrofera Blue; she has developed a very fibrinous adherent surface requiring mechanical debridement therefore I been bringing her back weekly. Dimensions of  the wound are better. The patient is changing her dressing herself 5/18-Patient returns at 1 week. She did have debridement the last time she was here. Wound appears to be improving. Patient is doing her dressing changes with [facility] 6/1; patient is using Hydrofera Blue. She was peeling some skin off her lower leg leaving where it is superficial area distal to her original wound. 6/15; 2-week follow-up. The patient is using Hydrofera Blue with some improvement in dimensions. She had the distal area that we identified last visit. 6/29; 2-week follow-up. The patient is using Hydrofera Blue. Nice improvement in dimensions. She still requires debridement still using Hydrofera Blue 7/13; 2-week follow-up. The patient is using Hydrofera Blue changing the dressing herself. There continues to be slow improvement in the dimensions of this wound especially in length. Wound continues to make gradual improvement. She saw Dr. [name] several months ago and stated he would consider an angiogram if the wound stalls however he continues to make gradual improvement 7/27 2-week follow-up. The patient is using Hydrofera Blue there is been excellent improvements in the surface area. The patient has known PAD and follows with Dr. [name] of interventional cardiology. It was not felt that  she would require an angiogram unless the wound deteriorated or stalled and it has done neither 8/10-2-week follow-up patient is using Hydrofera Blue, the left anterior leg wound appears stable, there is a thin rim of organization, there is a new area below that that actually started out as a small blister that she burst which led to a small wound 8/24; 2-week follow-up. The patient's wound is fully epithelialized. The patient has a history of known PAD. We had Dr. [name] review her early in the stay in our clinic. He felt she would probably have enough blood flow to heal this and only recommended angiograms if the wounds deteriorated or stalled. Readmission: 05/29/2020 upon evaluation today patient presents for reevaluation here in our clinic this is a patient that is a prior client of Dr. Janalyn Rouse whom I have never seen. The good news is the wound we were previously treating her for healed and has done excellent. Unfortunately she did fall on April 22 and sustained a tibial plateau fracture on the left. Subsequently this did lead to increased swelling and then she subsequently had blisters and skin openings that occurred following. Unfortunately this has been painful and she does have significant peripheral vascular disease on this left leg with a very low ABI around 0.5 and the TBI previously was around 0. Subsequently this is obviously a indication that we probably should not compression wrap her in general. With that being said she has tried multiple medications including Silvadene and Xeroform which was recommended by urgent care she states that only seem to make it worse. She also attempted [facility] but again she states that she still had blistering and may not have been the best dressing due to the fact that she really did not have any compression going along with that and it was just getting saturated and sitting on her skin. She is on Eliquis at this point and subsequently is  also ambulating with a prosthesis on the right she has a right above-knee amputation. The patient is on long-term anticoagulant therapy due to atrial fibrillation. She also has chronic venous insufficiency. 06/05/2020 upon evaluation today patient appears to be doing really roughly the same compared to last week. Fortunately there is no signs of overall worsening. She has been tolerating the dressing changes  without complication. She tells me even the Tubigrip just in a single layer is causing her some discomfort but fortunately I still think this is helping some with edema control which is what we really need I believe that is about all she is good to be able to tolerate even that is tenuous. The patient was apparently on hydroxyurea as she has been taken off of this as that can potentially complicate the situation. Objective Constitutional Well-nourished and well-hydrated in no acute distress. Vitals Time Taken: 10:05 AM, Height: 65 in, Weight: 144 lbs, BMI: 24, Temperature: 98.4 F, Pulse: 73 bpm, Respiratory Rate: 18 breaths/min, Blood Pressure: 164/70 mmHg. Respiratory normal breathing without difficulty. Psychiatric this patient is able to make decisions and demonstrates good insight into disease process. Alert and Oriented x 3. pleasant and cooperative. General Notes: Upon inspection patient's wound bed actually showed signs of good granulation at this time there does not appear to be any evidence of active infection there was some new epithelization as well and overall I am pleased. Hopefully the cessation of the hydroxyurea followed with the use of the Tubigrip is going to be of benefit for her. Integumentary (Hair, Skin) Wound #7 status is Open. Original cause of wound was Blister. The wound is located on the Left,Anterior Lower Leg. The wound measures 0.6cm length x 0.5cm width x 0.1cm depth; 0.236cm^2 area and 0.024cm^3 volume. There is Fat Layer (Subcutaneous Tissue) Exposed  exposed. There is no tunneling or undermining noted. There is a medium amount of serosanguineous drainage noted. The wound margin is flat and intact. There is small (1-33%) pink granulation within the wound bed. There is a large (67-100%) amount of necrotic tissue within the wound bed including Adherent Slough. Wound #8 status is Open. Original cause of wound was Blister. The wound is located on the Left,Medial Lower Leg. The wound measures 10cm length x 3cm width x 0.1cm depth; 23.562cm^2 area and 2.356cm^3 volume. There is Fat Layer (Subcutaneous Tissue) Exposed exposed. There is no tunneling or undermining noted. There is a medium amount of serous drainage noted. The wound margin is flat and intact. There is small (1-33%) pink, pale granulation within the wound bed. There is a large (67-100%) amount of necrotic tissue within the wound bed including Adherent Slough. Wound #9 status is Open. Original cause of wound was Gradually Appeared. The wound is located on the Left,Posterior Lower Leg. The wound measures 9.8cm length x 1cm width x 0.1cm depth; 7.697cm^2 area and 0.77cm^3 volume. There is Fat Layer (Subcutaneous Tissue) Exposed exposed. There is no tunneling or undermining noted. There is a medium amount of serosanguineous drainage noted. The wound margin is distinct with the outline attached to the wound base. There is small (1-33%) red, pink granulation within the wound bed. There is a large (67-100%) amount of necrotic tissue within the wound bed including Adherent Slough. Assessment Active Problems ICD-10 Venous insufficiency (chronic) (peripheral) Non-pressure chronic ulcer of other part of left lower leg with fat layer exposed Other specified peripheral vascular diseases Acquired absence of right leg above knee Paroxysmal atrial fibrillation Long term (current) use of anticoagulants Plan Follow-up Appointments: Return Appointment in 1 week. Dressing Change Frequency: Wound #7  Left,Anterior Lower Leg: Change Dressing every other day. Wound #9 Left,Posterior Lower Leg: Change Dressing every other day. Wound #8 Left,Medial Lower Leg: Change Dressing every other day. Skin Barriers/Peri-Wound Care: Moisturizing lotion - to dry skin with dressing changes Wound Cleansing: Wound #7 Left,Anterior Lower Leg: May shower and wash wound  with soap and water. Wound #8 Left,Medial Lower Leg: May shower and wash wound with soap and water. Wound #9 Left,Posterior Lower Leg: May shower and wash wound with soap and water. Primary Wound Dressing: Wound #7 Left,Anterior Lower Leg: Calcium Alginate with Silver Wound #9 Left,Posterior Lower Leg: Calcium Alginate with Silver Wound #8 Left,Medial Lower Leg: Calcium Alginate with Silver Secondary Dressing: Wound #7 Left,Anterior Lower Leg: Kerlix/Rolled Gauze ABD pad Wound #8 Left,Medial Lower Leg: Kerlix/Rolled Gauze ABD pad Wound #9 Left,Posterior Lower Leg: Kerlix/Rolled Gauze ABD pad Edema Control: Avoid standing for long periods of time Elevate legs to the level of the heart or above for 30 minutes daily and/or when sitting, a frequency of: Support Garment 10-20 mm/Hg pressure to: - tubigrip tubular stocking to left leg over dressing, apply in morning and remove at night Additional Orders / Instructions: Other: - take antibiotic as prescribed 1. I would recommend currently that we go ahead and initiate treatment with a continuation of the silver alginate dressing I think this seems to be doing well for her. 2. I am also can recommend that we continue to use an ABD pad and roll gauze to secure this. 3. I would also recommend that we use Tubigrip in order to have a little bit of compression obviously with her poor arterial flow we really cannot do much in this regard. 4. She will continue to take her antibiotic which was prescribed at this point. That is the Bactrim DS I gave her last week. We will see patient back  for reevaluation in 1 week here in the clinic. If anything worsens or changes patient will contact our office for additional recommendations. Electronic Signature(s) Signed: 06/05/2020 11:14:53 AM By: [name] PA-C Entered By: [name] on 06/05/2020 11:14:52 -------------------------------------------------------------------------------- SuperBill Details Patient Name: Date of Service: [place], [name] 06/05/2020 Medical Record Number: [id number] Patient Account Number: [id number] Date of Birth/Sex: Treating RN: 1951/12/06 (69 y.o. Martyn Malay, [name] Primary Care Provider: Jori Moll, Karsten Fells Other Clinician: Referring Provider: Treating Provider/Extender: Oliver Pila, BRITNEY Weeks in Treatment: 1 Diagnosis Coding ICD-10 Codes Code Description I87.2 Venous insufficiency (chronic) (peripheral) L97.822 Non-pressure chronic ulcer of other part of left lower leg with fat layer exposed I73.89 Other specified peripheral vascular diseases Z89.611 Acquired absence of right leg above knee I48.0 Paroxysmal atrial fibrillation Z79.01 Long term (current) use of anticoagulants Facility Procedures CPT4 Code: [id number] Description: 99214 - WOUND CARE VISIT-LEV 4 EST PT Modifier: Quantity: 1 Physician Procedures : CPT4 Code Description Modifier [id number] - WC PHYS LEVEL 3 - EST PT ICD-10 Diagnosis Description I87.2 Venous insufficiency (chronic) (peripheral) L97.822 Non-pressure chronic ulcer of other part of left lower leg with fat layer exposed I73.89  Other specified peripheral vascular diseases Z89.611 Acquired absence of right leg above knee Quantity: 1 Electronic Signature(s) Signed: 06/05/2020 11:15:04 AM By: [name] PA-C Entered By: [name] on 06/05/2020 11:15:03

## 2020-06-06 NOTE — Progress Notes (Signed)
[name], [name] ([id number]) Visit Report for 06/05/2020 Arrival Information Details Patient Name: Date of Service: [name], [name] 06/05/2020 9:45 A M Medical Record Number: [id number] Patient Account Number: [id number] Date of Birth/Sex: Treating RN: [birth year] (69 y.o. [name] Primary Care Tashema Tiller: Jori Moll, Karsten Fells Other Clinician: Referring Cruz Devilla: Treating Elijha Dedman/Extender: Oliver Pila, BRITNEY Weeks in Treatment: 1 Visit Information History Since Last Visit Added or deleted any medications: No Patient Arrived: Walker Any new allergies or adverse reactions: No Arrival Time: 10:05 Had a fall or experienced change in No Accompanied By: self activities of daily living that may affect Transfer Assistance: None risk of falls: Patient Identification Verified: Yes Signs or symptoms of abuse/neglect since last visito No Secondary Verification Process Completed: Yes Hospitalized since last visit: No Patient Requires Transmission-Based Precautions: No Implantable device outside of the clinic excluding No Patient Has Alerts: Yes cellular tissue based products placed in the center Patient Alerts: Patient on Blood Thinner since last visit: L ABI non compressible Has Dressing in Place as Prescribed: Yes Pain Present Now: Yes Electronic Signature(s) Signed: 06/05/2020 5:03:23 PM By: Deon Pilling Entered By: Deon Pilling on 06/05/2020 10:10:10 -------------------------------------------------------------------------------- Clinic Level of Care Assessment Details Patient Name: Date of Service: [name], [name] 06/05/2020 9:45 A M Medical Record Number: [id number] Patient Account Number: [id number] Date of Birth/Sex: Treating RN: [birth year] (69 y.o. [name] Primary Care Joakim Huesman: Jori Moll, Karsten Fells Other Clinician: Referring Kavina Cantave: Treating Maison Kestenbaum/Extender: Oliver Pila, BRITNEY Weeks in Treatment: 1 Clinic Level of Care Assessment  Items TOOL 4 Quantity Score []  - 0 Use when only an EandM is performed on FOLLOW-UP visit ASSESSMENTS - Nursing Assessment / Reassessment X- 1 10 Reassessment of Co-morbidities (includes updates in patient status) X- 1 5 Reassessment of Adherence to Treatment Plan ASSESSMENTS - Wound and Skin A ssessment / Reassessment []  - 0 Simple Wound Assessment / Reassessment - one wound X- 3 5 Complex Wound Assessment / Reassessment - multiple wounds []  - 0 Dermatologic / Skin Assessment (not related to wound area) ASSESSMENTS - Focused Assessment X- 1 5 Circumferential Edema Measurements - multi extremities []  - 0 Nutritional Assessment / Counseling / Intervention X- 1 5 Lower Extremity Assessment (monofilament, tuning fork, pulses) []  - 0 Peripheral Arterial Disease Assessment (using hand held doppler) ASSESSMENTS - Ostomy and/or Continence Assessment and Care []  - 0 Incontinence Assessment and Management []  - 0 Ostomy Care Assessment and Management (repouching, etc.) PROCESS - Coordination of Care X - Simple Patient / Family Education for ongoing care 1 15 []  - 0 Complex (extensive) Patient / Family Education for ongoing care X- 1 10 Staff obtains Programmer, systems, Records, T Results / Process Orders est []  - 0 Staff telephones HHA, Nursing Homes / Clarify orders / etc []  - 0 Routine Transfer to another Facility (non-emergent condition) []  - 0 Routine Hospital Admission (non-emergent condition) []  - 0 New Admissions / Biomedical engineer / Ordering NPWT Apligraf, etc. , []  - 0 Emergency Hospital Admission (emergent condition) X- 1 10 Simple Discharge Coordination []  - 0 Complex (extensive) Discharge Coordination PROCESS - Special Needs []  - 0 Pediatric / Minor Patient Management []  - 0 Isolation Patient Management []  - 0 Hearing / Language / Visual special needs []  - 0 Assessment of Community assistance (transportation, D/C planning, etc.) []  - 0 Additional  assistance / Altered mentation []  - 0 Support Surface(s) Assessment (bed, cushion, seat, etc.) INTERVENTIONS - Wound Cleansing / Measurement []  -  0 Simple Wound Cleansing - one wound X- 3 5 Complex Wound Cleansing - multiple wounds X- 1 5 Wound Imaging (photographs - any number of wounds) []  - 0 Wound Tracing (instead of photographs) []  - 0 Simple Wound Measurement - one wound X- 3 5 Complex Wound Measurement - multiple wounds INTERVENTIONS - Wound Dressings []  - 0 Small Wound Dressing one or multiple wounds X- 1 15 Medium Wound Dressing one or multiple wounds []  - 0 Large Wound Dressing one or multiple wounds X- 1 5 Application of Medications - topical []  - 0 Application of Medications - injection INTERVENTIONS - Miscellaneous []  - 0 External ear exam []  - 0 Specimen Collection (cultures, biopsies, blood, body fluids, etc.) []  - 0 Specimen(s) / Culture(s) sent or taken to Lab for analysis []  - 0 Patient Transfer (multiple staff / Civil Service fast streamer / Similar devices) []  - 0 Simple Staple / Suture removal (25 or less) []  - 0 Complex Staple / Suture removal (26 or more) []  - 0 Hypo / Hyperglycemic Management (close monitor of Blood Glucose) []  - 0 Ankle / Brachial Index (ABI) - do not check if billed separately X- 1 5 Vital Signs Has the patient been seen at the hospital within the last three years: Yes Total Score: 135 Level Of Care: New/Established - Level 4 Electronic Signature(s) Signed: 06/06/2020 5:22:52 PM By: Baruch Gouty RN, [name] Entered By: Baruch Gouty on 06/05/2020 11:10:03 -------------------------------------------------------------------------------- Encounter Discharge Information Details Patient Name: Date of Service: [name], [name] 06/05/2020 9:45 A M Medical Record Number: [id number] Patient Account Number: [id number] Date of Birth/Sex: Treating RN: [birth year] (69 y.o. [name] Primary Care Huie Ghuman: Jori Moll, Karsten Fells Other  Clinician: Referring Kely Dohn: Treating Tashi Band/Extender: Baron Hamper Weeks in Treatment: 1 Encounter Discharge Information Items Discharge Condition: Stable Ambulatory Status: Walker Discharge Destination: Home Transportation: Private Auto Accompanied By: self Schedule Follow-up Appointment: Yes Clinical Summary of Care: Patient Declined Electronic Signature(s) Signed: 06/05/2020 4:55:19 PM By: Carlene Coria RN Entered By: Carlene Coria on 06/05/2020 11:27:29 -------------------------------------------------------------------------------- Lower Extremity Assessment Details Patient Name: Date of Service: [place], [name] 06/05/2020 9:45 A M Medical Record Number: [id number] Patient Account Number: [id number] Date of Birth/Sex: Treating RN: 09-Dec-1951 (69 y.o. [name], Meta.Reding Primary Care Trevelle Mcgurn: Jori Moll, Karsten Fells Other Clinician: Referring Ranessa Kosta: Treating Martena Emanuele/Extender: Oliver Pila, BRITNEY Weeks in Treatment: 1 Edema Assessment Assessed: [Left: Yes] [Right: No] Edema: [Left: Ye] [Right: s] Calf Left: Right: Point of Measurement: 28 cm From Medial Instep 33.5 cm cm Ankle Left: Right: Point of Measurement: 9 cm From Medial Instep 20 cm cm Vascular Assessment Pulses: Dorsalis Pedis Palpable: [Left:Yes] Electronic Signature(s) Signed: 06/05/2020 5:03:23 PM By: Deon Pilling Entered By: Deon Pilling on 06/05/2020 10:13:45 -------------------------------------------------------------------------------- [place] Details Patient Name: Date of Service: [name], [name] 06/05/2020 9:45 A M Medical Record Number: [id number] Patient Account Number: [id number] Date of Birth/Sex: Treating RN: 1951-01-04 (69 y.o. [name] Primary Care Anara Cowman: Jori Moll, Karsten Fells Other Clinician: Referring Laylynn Campanella: Treating Jathniel Smeltzer/Extender: Oliver Pila, BRITNEY Weeks in Treatment: 1 Active Inactive Pain, Acute or  Chronic Nursing Diagnoses: Pain, acute or chronic: actual or potential Potential alteration in comfort, pain Goals: Patient will verbalize adequate pain control and receive pain control interventions during procedures as needed Date Initiated: 05/29/2020 Target Resolution Date: 06/28/2020 Goal Status: Active Interventions: Provide education on pain management Reposition patient for comfort Treatment Activities: Administer pain control measures as ordered :  05/29/2020 Notes: Wound/Skin Impairment Nursing Diagnoses: Knowledge deficit related to ulceration/compromised skin integrity Goals: Patient/caregiver will verbalize understanding of skin care regimen Date Initiated: 05/29/2020 Target Resolution Date: 06/28/2020 Goal Status: Active Interventions: Assess patient/caregiver ability to perform ulcer/skin care regimen upon admission and as needed Assess ulceration(s) every visit Provide education on ulcer and skin care Treatment Activities: Skin care regimen initiated : 05/29/2020 Topical wound management initiated : 05/29/2020 Notes: Electronic Signature(s) Signed: 06/06/2020 5:22:52 PM By: Baruch Gouty RN, [name] Entered By: Baruch Gouty on 06/05/2020 11:06:42 -------------------------------------------------------------------------------- Pain Assessment Details Patient Name: Date of Service: Stacey Shary [name], [name] 06/05/2020 9:45 A M Medical Record Number: [id number] Patient Account Number: [id number] Date of Birth/Sex: Treating RN: [birth year] (69 y.o. [name] Primary Care Amna Welker: Jori Moll, Karsten Fells Other Clinician: Referring Khaleel Beckom: Treating Tyasia Packard/Extender: Oliver Pila, BRITNEY Weeks in Treatment: 1 Active Problems Location of Pain Severity and Description of Pain Patient Has Paino Yes Site Locations Pain Location: Pain in Ulcers Rate the pain. Current Pain Level: 8 Worst Pain Level: 10 Least Pain Level: 0 Tolerable Pain Level: 8 Character of  Pain Describe the Pain: Aching, Heavy, Sharp Pain Management and Medication Current Pain Management: Medication: No Cold Application: No Rest: No Massage: No Activity: No T.E.N.S.: No Heat Application: No Leg drop or elevation: No Is the Current Pain Management Adequate: Adequate How does your wound impact your activities of daily livingo Sleep: No Bathing: No Appetite: No Relationship With Others: No Bladder Continence: No Emotions: No Bowel Continence: No Work: No Toileting: No Drive: No Dressing: No Hobbies: No Electronic Signature(s) Signed: 06/05/2020 5:03:23 PM By: Deon Pilling Entered By: Deon Pilling on 06/05/2020 10:10:34 -------------------------------------------------------------------------------- Patient/Caregiver Education Details Patient Name: Date of Service: [name], [name] 6/16/2021andnbsp9:45 A M Medical Record Number: [id number] Patient Account Number: [id number] Date of Birth/Gender: Treating RN: 30-Jul-1951 (69 y.o. [name] Primary Care Physician: Jori Moll, Karsten Fells Other Clinician: Referring Physician: Treating Physician/Extender: Baron Hamper Weeks in Treatment: 1 Education Assessment Education Provided To: Patient Education Topics Provided Venous: Methods: Explain/Verbal Responses: Reinforcements needed, State content correctly Wound/Skin Impairment: Methods: Explain/Verbal Responses: Reinforcements needed, State content correctly Electronic Signature(s) Signed: 06/06/2020 5:22:52 PM By: Baruch Gouty RN, [name] Entered By: Baruch Gouty on 06/05/2020 11:07:03 -------------------------------------------------------------------------------- Wound Assessment Details Patient Name: Date of Service: [name], [name] 06/05/2020 9:45 A M Medical Record Number: [id number] Patient Account Number: [id number] Date of Birth/Sex: Treating RN: [birth year] (69 y.o. [name], Meta.Reding Primary Care Shakeria Robinette: Jori Moll,  Karsten Fells Other Clinician: Referring Mckennon Zwart: Treating Lucia Harm/Extender: Oliver Pila, BRITNEY Weeks in Treatment: 1 Wound Status Wound Number: 7 Primary Venous Leg Ulcer Etiology: Wound Location: Left, Anterior Lower Leg Wound Open Wounding Event: Blister Status: Date Acquired: 04/11/2020 Comorbid Glaucoma, Arrhythmia, Hypertension, Peripheral Arterial Disease, Weeks Of Treatment: 1 History: Peripheral Venous Disease, Gout, Osteoarthritis Clustered Wound: No Photos Photo Uploaded By: Mikeal Hawthorne on 06/05/2020 13:55:18 Wound Measurements Length: (cm) 0.6 Width: (cm) 0.5 Depth: (cm) 0.1 Area: (cm) 0.236 Volume: (cm) 0.024 % Reduction in Area: 28.5% % Reduction in Volume: 27.3% Epithelialization: None Tunneling: No Undermining: No Wound Description Classification: Full Thickness Without Exposed Support Structures Wound Margin: Flat and Intact Exudate Amount: Medium Exudate Type: Serosanguineous Exudate Color: red, brown Foul Odor After Cleansing: No Slough/Fibrino Yes Wound Bed Granulation Amount: Small (1-33%) Exposed Structure Granulation Quality: Pink Fascia Exposed: No Necrotic Amount: Large (67-100%) Fat Layer (Subcutaneous Tissue) Exposed: Yes Necrotic Quality: Adherent Slough Tendon Exposed:  No Muscle Exposed: No Joint Exposed: No Bone Exposed: No Treatment Notes Wound #7 (Left, Anterior Lower Leg) 1. Cleanse With Wound Cleanser 3. Primary Dressing Applied Calcium Alginate Ag 4. Secondary Dressing Dry Gauze Roll Gauze 5. Secured With Recruitment consultant) Signed: 06/05/2020 5:03:23 PM By: Deon Pilling Entered By: Deon Pilling on 06/05/2020 10:14:04 -------------------------------------------------------------------------------- Wound Assessment Details Patient Name: Date of Service: [name], [name] 06/05/2020 9:45 A M Medical Record Number: [id number] Patient Account Number: [id number] Date of Birth/Sex: Treating  RN: 1951-06-23 (69 y.o. [name], Meta.Reding Primary Care Dhillon [place]: Jori Moll, Karsten Fells Other Clinician: Referring Victorine Mcnee: Treating Ikeem Cleckler/Extender: Oliver Pila, BRITNEY Weeks in Treatment: 1 Wound Status Wound Number: 8 Primary Venous Leg Ulcer Etiology: Wound Location: Left, Medial Lower Leg Wound Open Wounding Event: Blister Status: Date Acquired: 04/11/2020 Comorbid Glaucoma, Arrhythmia, Hypertension, Peripheral Arterial Disease, Weeks Of Treatment: 1 History: Peripheral Venous Disease, Gout, Osteoarthritis Clustered Wound: Yes Photos Photo Uploaded By: Mikeal Hawthorne on 06/05/2020 13:55:41 Wound Measurements Length: (cm) 10 Width: (cm) 3 Depth: (cm) 0.1 Clustered Quantity: 4 Area: (cm) 23.562 Volume: (cm) 2.356 % Reduction in Area: 67.4% % Reduction in Volume: 67.4% Epithelialization: Medium (34-66%) Tunneling: No Undermining: No Wound Description Classification: Full Thickness Without Exposed Support Structures Wound Margin: Flat and Intact Exudate Amount: Medium Exudate Type: Serous Exudate Color: amber Foul Odor After Cleansing: No Slough/Fibrino Yes Wound Bed Granulation Amount: Small (1-33%) Exposed Structure Granulation Quality: Pink, Pale Fascia Exposed: No Necrotic Amount: Large (67-100%) Fat Layer (Subcutaneous Tissue) Exposed: Yes Necrotic Quality: Adherent Slough Tendon Exposed: No Muscle Exposed: No Joint Exposed: No Bone Exposed: No Treatment Notes Wound #8 (Left, Medial Lower Leg) 1. Cleanse With Wound Cleanser 3. Primary Dressing Applied Calcium Alginate Ag 4. Secondary Dressing Dry Gauze Roll Gauze 5. Secured With Recruitment consultant) Signed: 06/05/2020 5:03:23 PM By: Deon Pilling Entered By: Deon Pilling on 06/05/2020 10:14:31 -------------------------------------------------------------------------------- Wound Assessment Details Patient Name: Date of Service: [name], [name] 06/05/2020 9:45 A M Medical  Record Number: [id number] Patient Account Number: [id number] Date of Birth/Sex: Treating RN: 08-09-1951 (69 y.o. [name], Meta.Reding Primary Care Tahjae Clausing: Jori Moll, Karsten Fells Other Clinician: Referring Leisa Gault: Treating Loyd Marhe[place]/Extender: Oliver Pila, BRITNEY Weeks in Treatment: 1 Wound Status Wound Number: 9 Primary Venous Leg Ulcer Etiology: Wound Location: Left, Posterior Lower Leg Wound Open Wounding Event: Gradually Appeared Status: Date Acquired: 06/05/2020 Comorbid Glaucoma, Arrhythmia, Hypertension, Peripheral Arterial Disease, Weeks Of Treatment: 0 History: Peripheral Venous Disease, Gout, Osteoarthritis Clustered Wound: Yes Photos Photo Uploaded By: Mikeal Hawthorne on 06/05/2020 13:55:19 Wound Measurements Length: (cm) 9.8 Width: (cm) 1 Depth: (cm) 0.1 Clustered Quantity: 3 Area: (cm) 7.697 Volume: (cm) 0.77 % Reduction in Area: % Reduction in Volume: Epithelialization: Small (1-33%) Tunneling: No Undermining: No Wound Description Classification: Full Thickness Without Exposed Support Structures Wound Margin: Distinct, outline attached Exudate Amount: Medium Exudate Type: Serosanguineous Exudate Color: red, brown Foul Odor After Cleansing: No Slough/Fibrino Yes Wound Bed Granulation Amount: Small (1-33%) Exposed Structure Granulation Quality: Red, Pink Fascia Exposed: No Necrotic Amount: Large (67-100%) Fat Layer (Subcutaneous Tissue) Exposed: Yes Necrotic Quality: Adherent Slough Tendon Exposed: No Muscle Exposed: No Joint Exposed: No Bone Exposed: No Treatment Notes Wound #9 (Left, Posterior Lower Leg) 1. Cleanse With Wound Cleanser 3. Primary Dressing Applied Calcium Alginate Ag 4. Secondary Dressing Dry Gauze Roll Gauze 5. Secured With Recruitment consultant) Signed: 06/05/2020 5:03:23 PM By: Deon Pilling Entered By: Deon Pilling on 06/05/2020  10:15:43 -------------------------------------------------------------------------------- Vitals Details Patient  Name: Date of Service: [name], [name] 06/05/2020 9:45 A M Medical Record Number: [id number] Patient Account Number: [id number] Date of Birth/Sex: Treating RN: 1951/12/21 (69 y.o. [name], Meta.Reding Primary Care Cloie Wooden: Jori Moll, Karsten Fells Other Clinician: Referring Taheerah Guldin: Treating Sanaya Gwilliam/Extender: Oliver Pila, BRITNEY Weeks in Treatment: 1 Vital Signs Time Taken: 10:05 Temperature (F): 98.4 Height (in): 65 Pulse (bpm): 73 Weight (lbs): 144 Respiratory Rate (breaths/min): 18 Body Mass Index (BMI): 24 Blood Pressure (mmHg): 164/70 Reference Range: 80 - 120 mg / dl Electronic Signature(s) Signed: 06/05/2020 5:03:23 PM By: Deon Pilling Entered By: Deon Pilling on 06/05/2020 10:10:54

## 2020-06-12 ENCOUNTER — Encounter (HOSPITAL_BASED_OUTPATIENT_CLINIC_OR_DEPARTMENT_OTHER): Payer: Medicare Other | Admitting: Physician Assistant

## 2020-06-12 ENCOUNTER — Other Ambulatory Visit (HOSPITAL_COMMUNITY)
Admission: RE | Admit: 2020-06-12 | Discharge: 2020-06-12 | Disposition: A | Discharge status: Home / Self Care | Payer: Medicare Other | Source: Other Acute Inpatient Hospital | Attending: Physician Assistant | Admitting: Physician Assistant

## 2020-06-12 DIAGNOSIS — X58XXXA Exposure to other specified factors, initial encounter: Secondary | ICD-10-CM | POA: Diagnosis not present

## 2020-06-12 DIAGNOSIS — L97822 Non-pressure chronic ulcer of other part of left lower leg with fat layer exposed: Secondary | ICD-10-CM | POA: Diagnosis not present

## 2020-06-12 DIAGNOSIS — L089 Local infection of the skin and subcutaneous tissue, unspecified: Secondary | ICD-10-CM | POA: Diagnosis not present

## 2020-06-12 DIAGNOSIS — S81802A Unspecified open wound, left lower leg, initial encounter: Secondary | ICD-10-CM | POA: Insufficient documentation

## 2020-06-12 NOTE — Progress Notes (Signed)
DORLA, GUIZAR ([id number]) Visit Report for 06/12/2020 Arrival Information Details Patient Name: Date of Service: [place], [name] 06/12/2020 9:45 A M Medical Record Number: [id number] Patient Account Number: [id number] Date of Birth/Sex: Treating RN: 1951-01-21 (69 y.o. Debby Bud Primary Care Yvette Roark: Jori Moll, Karsten Fells Other Clinician: Referring Balen Woolum: Treating Tramayne Sebesta/Extender: Oliver Pila, BRITNEY Weeks in Treatment: 2 Visit Information History Since Last Visit Added or deleted any medications: No Patient Arrived: [place] Any new allergies or adverse reactions: No Arrival Time: 09:53 Had a fall or experienced change in No Accompanied By: self activities of daily living that may affect Transfer Assistance: None risk of falls: Patient Identification Verified: Yes Signs or symptoms of abuse/neglect since last visito No Secondary Verification Process Completed: Yes Hospitalized since last visit: No Patient Requires Transmission-Based Precautions: No Implantable device outside of the clinic excluding No Patient Has Alerts: Yes cellular tissue based products placed in the center Patient Alerts: Patient on Blood Thinner since last visit: L ABI non compressible Has Dressing in Place as Prescribed: Yes Pain Present Now: Yes Electronic Signature(s) Signed: 06/12/2020 4:53:33 PM By: Deon Pilling Entered By: Deon Pilling on 06/12/2020 09:57:25 -------------------------------------------------------------------------------- Clinic Level of Care Assessment Details Patient Name: Date of Service: [place], [name] 06/12/2020 9:45 A M Medical Record Number: [id number] Patient Account Number: [id number] Date of Birth/Sex: Treating RN: [birth year] (69 y.o. [name] Primary Care Jamarr Treinen: Jori Moll, Karsten Fells Other Clinician: Referring Jyl Chico: Treating Ardon Franklin/Extender: Oliver Pila, BRITNEY Weeks in Treatment: 2 Clinic Level of Care Assessment  Items TOOL 4 Quantity Score []  - 0 Use when only an EandM is performed on FOLLOW-UP visit ASSESSMENTS - Nursing Assessment / Reassessment X- 1 10 Reassessment of Co-morbidities (includes updates in patient status) X- 1 5 Reassessment of Adherence to Treatment Plan ASSESSMENTS - Wound and Skin A ssessment / Reassessment []  - 0 Simple Wound Assessment / Reassessment - one wound X- 3 5 Complex Wound Assessment / Reassessment - multiple wounds []  - 0 Dermatologic / Skin Assessment (not related to wound area) ASSESSMENTS - Focused Assessment X- 1 5 Circumferential Edema Measurements - multi extremities []  - 0 Nutritional Assessment / Counseling / Intervention X- 1 5 Lower Extremity Assessment (monofilament, tuning fork, pulses) []  - 0 Peripheral Arterial Disease Assessment (using hand held doppler) ASSESSMENTS - Ostomy and/or Continence Assessment and Care []  - 0 Incontinence Assessment and Management []  - 0 Ostomy Care Assessment and Management (repouching, etc.) PROCESS - Coordination of Care X - Simple Patient / Family Education for ongoing care 1 15 []  - 0 Complex (extensive) Patient / Family Education for ongoing care X- 1 10 Staff obtains Programmer, systems, Records, T Results / Process Orders est []  - 0 Staff telephones HHA, Nursing Homes / Clarify orders / etc []  - 0 Routine Transfer to another Facility (non-emergent condition) []  - 0 Routine Hospital Admission (non-emergent condition) []  - 0 New Admissions / Biomedical engineer / Ordering NPWT Apligraf, etc. , []  - 0 Emergency Hospital Admission (emergent condition) X- 1 10 Simple Discharge Coordination []  - 0 Complex (extensive) Discharge Coordination PROCESS - Special Needs []  - 0 Pediatric / Minor Patient Management []  - 0 Isolation Patient Management []  - 0 Hearing / Language / Visual special needs []  - 0 Assessment of Community assistance (transportation, D/C planning, etc.) []  - 0 Additional  assistance / Altered mentation []  - 0 Support Surface(s) Assessment (bed, cushion, seat, etc.) INTERVENTIONS - Wound Cleansing / Measurement []  -  0 Simple Wound Cleansing - one wound X- 3 5 Complex Wound Cleansing - multiple wounds X- 1 5 Wound Imaging (photographs - any number of wounds) []  - 0 Wound Tracing (instead of photographs) []  - 0 Simple Wound Measurement - one wound X- 3 5 Complex Wound Measurement - multiple wounds INTERVENTIONS - Wound Dressings []  - 0 Small Wound Dressing one or multiple wounds X- 1 15 Medium Wound Dressing one or multiple wounds []  - 0 Large Wound Dressing one or multiple wounds X- 1 5 Application of Medications - topical []  - 0 Application of Medications - injection INTERVENTIONS - Miscellaneous []  - 0 External ear exam []  - 0 Specimen Collection (cultures, biopsies, blood, body fluids, etc.) []  - 0 Specimen(s) / Culture(s) sent or taken to Lab for analysis []  - 0 Patient Transfer (multiple staff / Civil Service fast streamer / Similar devices) []  - 0 Simple Staple / Suture removal (25 or less) []  - 0 Complex Staple / Suture removal (26 or more) []  - 0 Hypo / Hyperglycemic Management (close monitor of Blood Glucose) []  - 0 Ankle / Brachial Index (ABI) - do not check if billed separately X- 1 5 Vital Signs Has the patient been seen at the hospital within the last three years: Yes Total Score: 135 Level Of Care: New/Established - Level 4 Electronic Signature(s) Signed: 06/12/2020 4:25:30 PM By: Baruch Gouty RN, [name] Entered By: Baruch Gouty on 06/12/2020 10:25:16 -------------------------------------------------------------------------------- Encounter Discharge Information Details Patient Name: Date of Service: [place], [name] 06/12/2020 9:45 A M Medical Record Number: [id number] Patient Account Number: [id number] Date of Birth/Sex: Treating RN: November 23, 1951 (69 y.o. [name] Primary Care Shardae Kleinman: Jori Moll, Karsten Fells Other  Clinician: Referring Nabeel Gladson: Treating Dewaine Morocho/Extender: Baron Hamper Weeks in Treatment: 2 Encounter Discharge Information Items Discharge Condition: Stable Ambulatory Status: [place] Discharge Destination: Home Transportation: Private Auto Accompanied By: self Schedule Follow-up Appointment: Yes Clinical Summary of Care: Patient Declined Electronic Signature(s) Signed: 06/12/2020 4:51:47 PM By: Carlene Coria RN Entered By: Carlene Coria on 06/12/2020 11:17:25 -------------------------------------------------------------------------------- Lower Extremity Assessment Details Patient Name: Date of Service: [place], [name] 06/12/2020 9:45 A M Medical Record Number: [id number] Patient Account Number: [id number] Date of Birth/Sex: Treating RN: 02/07/1951 (69 y.o. Helene Shoe, Meta.Reding Primary Care Aune Adami: Jori Moll, Karsten Fells Other Clinician: Referring Sofiah Lyne: Treating Ishmel Acevedo/Extender: Oliver Pila, BRITNEY Weeks in Treatment: 2 Edema Assessment Assessed: [Left: Yes] [Right: No] Edema: [Left: Ye] [Right: s] Calf Left: Right: Point of Measurement: 28 cm From Medial Instep 31 cm cm Ankle Left: Right: Point of Measurement: 9 cm From Medial Instep 19.5 cm cm Vascular Assessment Pulses: Dorsalis Pedis Palpable: [Left:Yes] Electronic Signature(s) Signed: 06/12/2020 4:53:33 PM By: Deon Pilling Entered By: Deon Pilling on 06/12/2020 10:06:02 -------------------------------------------------------------------------------- Multi-Disciplinary Care Plan Details Patient Name: Date of Service: [place], [name] 06/12/2020 9:45 A M Medical Record Number: [id number] Patient Account Number: [id number] Date of Birth/Sex: Treating RN: Aug 06, 1951 (69 y.o. [name] Primary Care Ahlia Lemanski: Jori Moll, Karsten Fells Other Clinician: Referring Danine Hor: Treating Shatira Dobosz/Extender: Oliver Pila, BRITNEY Weeks in Treatment: 2 Active Inactive Pain, Acute or  Chronic Nursing Diagnoses: Pain, acute or chronic: actual or potential Potential alteration in comfort, pain Goals: Patient will verbalize adequate pain control and receive pain control interventions during procedures as needed Date Initiated: 05/29/2020 Target Resolution Date: 06/28/2020 Goal Status: Active Interventions: Provide education on pain management Reposition patient for comfort Treatment Activities: Administer pain control measures as ordered :  05/29/2020 Notes: Wound/Skin Impairment Nursing Diagnoses: Knowledge deficit related to ulceration/compromised skin integrity Goals: Patient/caregiver will verbalize understanding of skin care regimen Date Initiated: 05/29/2020 Target Resolution Date: 06/28/2020 Goal Status: Active Interventions: Assess patient/caregiver ability to perform ulcer/skin care regimen upon admission and as needed Assess ulceration(s) every visit Provide education on ulcer and skin care Treatment Activities: Skin care regimen initiated : 05/29/2020 Topical wound management initiated : 05/29/2020 Notes: Electronic Signature(s) Signed: 06/12/2020 4:25:30 PM By: Baruch Gouty RN, [name] Entered By: Baruch Gouty on 06/12/2020 10:23:53 -------------------------------------------------------------------------------- Pain Assessment Details Patient Name: Date of Service: [name], [name] 06/12/2020 9:45 A M Medical Record Number: [id number] Patient Account Number: [id number] Date of Birth/Sex: Treating RN: 19-Oct-1951 (69 y.o. Debby Bud Primary Care Shauni Henner: Jori Moll, Karsten Fells Other Clinician: Referring Jasn Xia: Treating Vencil Basnett/Extender: Oliver Pila, BRITNEY Weeks in Treatment: 2 Active Problems Location of Pain Severity and Description of Pain Patient Has Paino Yes Site Locations Pain Location: Pain in Ulcers Rate the pain. Current Pain Level: 8 Worst Pain Level: 10 Least Pain Level: 0 Tolerable Pain Level: 8 Character of  Pain Describe the Pain: Aching, Sharp Pain Management and Medication Current Pain Management: Medication: Yes Cold Application: No Rest: Yes Massage: No Activity: No T.E.N.S.: No Heat Application: No Leg drop or elevation: No Is the Current Pain Management Adequate: Adequate How does your wound impact your activities of daily livingo Sleep: No Bathing: No Appetite: No Relationship With Others: No Bladder Continence: No Emotions: No Bowel Continence: No Work: No Toileting: No Drive: No Dressing: No Hobbies: No Electronic Signature(s) Signed: 06/12/2020 4:53:33 PM By: Deon Pilling Entered By: Deon Pilling on 06/12/2020 10:05:50 -------------------------------------------------------------------------------- Patient/Caregiver Education Details Patient Name: Date of Service: Marguerita [place], [name] 6/23/2021andnbsp9:45 A M Medical Record Number: [id number] Patient Account Number: [id number] Date of Birth/Gender: Treating RN: [birth year] (69 y.o. [name] Primary Care Physician: Jori Moll, Karsten Fells Other Clinician: Referring Physician: Treating Physician/Extender: Baron Hamper Weeks in Treatment: 2 Education Assessment Education Provided To: Patient Education Topics Provided Infection: Methods: Explain/Verbal Responses: Reinforcements needed, State content correctly Pain: Methods: Explain/Verbal Responses: Reinforcements needed, State content correctly Wound/Skin Impairment: Methods: Explain/Verbal Responses: Reinforcements needed, State content correctly Electronic Signature(s) Signed: 06/12/2020 4:25:30 PM By: Baruch Gouty RN, [name] Entered By: Baruch Gouty on 06/12/2020 10:24:17 -------------------------------------------------------------------------------- Wound Assessment Details Patient Name: Date of Service: [name], [name] 06/12/2020 9:45 A M Medical Record Number: [id number] Patient Account Number: [id number] Date of  Birth/Sex: Treating RN: 1951-02-28 (69 y.o. Helene Shoe, Meta.Reding Primary Care Daquon Greenleaf: Jori Moll, Karsten Fells Other Clinician: Referring Brystal Kildow: Treating Elizet Kaplan/Extender: Oliver Pila, BRITNEY Weeks in Treatment: 2 Wound Status Wound Number: 7 Primary Venous Leg Ulcer Etiology: Wound Location: Left, Anterior Lower Leg Wound Open Wounding Event: Blister Status: Date Acquired: 04/11/2020 Comorbid Glaucoma, Arrhythmia, Hypertension, Peripheral Arterial Disease, Weeks Of Treatment: 2 History: Peripheral Venous Disease, Gout, Osteoarthritis Clustered Wound: No Photos Wound Measurements Length: (cm) 1 Width: (cm) 0.7 Depth: (cm) 0.1 Area: (cm) 0.55 Volume: (cm) 0.055 % Reduction in Area: -66.7% % Reduction in Volume: -66.7% Epithelialization: Small (1-33%) Tunneling: No Undermining: No Wound Description Classification: Full Thickness Without Exposed Support Structures Wound Margin: Flat and Intact Exudate Amount: Medium Exudate Type: Serosanguineous Exudate Color: red, brown Foul Odor After Cleansing: No Slough/Fibrino Yes Wound Bed Granulation Amount: None Present (0%) Exposed Structure Necrotic Amount: Large (67-100%) Fascia Exposed: No Necrotic Quality: Adherent Slough Fat Layer (Subcutaneous Tissue) Exposed: Yes Tendon Exposed: No  Muscle Exposed: No Joint Exposed: No Bone Exposed: No Treatment Notes Wound #7 (Left, Anterior Lower Leg) 1. Cleanse With Wound Cleanser 3. Primary Dressing Applied Calcium Alginate Ag 4. Secondary Dressing ABD Pad Dry Gauze Roll Gauze 5. Secured With Recruitment consultant) Signed: 06/12/2020 4:53:33 PM By: Deon Pilling Signed: 06/12/2020 5:20:20 PM By: Minerva Fester Entered By: Minerva Fester on 06/12/2020 16:16:13 -------------------------------------------------------------------------------- Wound Assessment Details Patient Name: Date of Service: [name], [name] 06/12/2020 9:45 A M Medical Record Number:  [id number] Patient Account Number: [id number] Date of Birth/Sex: Treating RN: Mar 07, 1951 (69 y.o. Helene Shoe, Meta.Reding Primary Care Shamicka Inga: Jori Moll, Karsten Fells Other Clinician: Referring Shirley [place]: Treating Lonnie Rosado/Extender: Oliver Pila, BRITNEY Weeks in Treatment: 2 Wound Status Wound Number: 8 Primary Venous Leg Ulcer Etiology: Wound Location: Left, Medial Lower Leg Wound Open Wounding Event: Blister Status: Date Acquired: 04/11/2020 Comorbid Glaucoma, Arrhythmia, Hypertension, Peripheral Arterial Disease, Weeks Of Treatment: 2 History: Peripheral Venous Disease, Gout, Osteoarthritis Clustered Wound: Yes Photos Wound Measurements Length: (cm) 4.5 Width: (cm) 1.5 Depth: (cm) 0.1 Clustered Quantity: 2 Area: (cm) 5.3 Volume: (cm) 0.5 % Reduction in Area: 92.7% % Reduction in Volume: 92.7% Epithelialization: Large (67-100%) Tunneling: No 01 Undermining: No 3 Wound Description Classification: Full Thickness Without Exposed Support Stru Wound Margin: Flat and Intact Exudate Amount: Medium Exudate Type: Serous Exudate Color: amber ctures Foul Odor After Cleansing: No Slough/Fibrino Yes Wound Bed Granulation Amount: Medium (34-66%) Exposed Structure Granulation Quality: Pink, Pale Fascia Exposed: No Necrotic Amount: Medium (34-66%) Fat Layer (Subcutaneous Tissue) Exposed: Yes Necrotic Quality: Adherent Slough Tendon Exposed: No Muscle Exposed: No Joint Exposed: No Bone Exposed: No Treatment Notes Wound #8 (Left, Medial Lower Leg) 1. Cleanse With Wound Cleanser 3. Primary Dressing Applied Calcium Alginate Ag 4. Secondary Dressing ABD Pad Dry Gauze Roll Gauze 5. Secured With Recruitment consultant) Signed: 06/12/2020 4:53:33 PM By: Deon Pilling Signed: 06/12/2020 5:20:20 PM By: Minerva Fester Entered By: Minerva Fester on 06/12/2020 16:18:30 -------------------------------------------------------------------------------- Wound Assessment  Details Patient Name: Date of Service: [name], [name] 06/12/2020 9:45 A M Medical Record Number: [id number] Patient Account Number: [id number] Date of Birth/Sex: Treating RN: 29-Aug-1951 (69 y.o. Helene Shoe, Meta.Reding Primary Care Lucille Witts: Jori Moll, Karsten Fells Other Clinician: Referring Analia Zuk: Treating Hidaya Daniel/Extender: Oliver Pila, BRITNEY Weeks in Treatment: 2 Wound Status Wound Number: 9 Primary Venous Leg Ulcer Etiology: Wound Location: Left, Posterior Lower Leg Wound Open Wounding Event: Gradually Appeared Status: Date Acquired: 06/05/2020 Comorbid Glaucoma, Arrhythmia, Hypertension, Peripheral Arterial Disease, Weeks Of Treatment: 1 History: Peripheral Venous Disease, Gout, Osteoarthritis Clustered Wound: Yes Photos Wound Measurements Length: (cm) 9.5 Width: (cm) 1 Depth: (cm) 0.1 Clustered Quantity: 3 Area: (cm) 7.461 Volume: (cm) 0.746 % Reduction in Area: 3.1% % Reduction in Volume: 3.1% Epithelialization: Small (1-33%) Tunneling: No Undermining: No Wound Description Classification: Full Thickness Without Exposed Support Structures Wound Margin: Distinct, outline attached Exudate Amount: Medium Exudate Type: Serosanguineous Exudate Color: red, brown Foul Odor After Cleansing: No Slough/Fibrino Yes Wound Bed Granulation Amount: Small (1-33%) Exposed Structure Granulation Quality: Red, Pink Fascia Exposed: No Necrotic Amount: Large (67-100%) Fat Layer (Subcutaneous Tissue) Exposed: Yes Necrotic Quality: Adherent Slough Tendon Exposed: No Muscle Exposed: No Joint Exposed: No Bone Exposed: No Treatment Notes Wound #9 (Left, Posterior Lower Leg) 1. Cleanse With Wound Cleanser 3. Primary Dressing Applied Calcium Alginate Ag 4. Secondary Dressing ABD Pad Dry Gauze Roll Gauze 5. Secured With Recruitment consultant) Signed: 06/12/2020 4:53:33 PM By: Deon Pilling Signed: 06/12/2020 5:20:20 PM  By: Minerva Fester Entered By: Minerva Fester  on 06/12/2020 16:18:10 -------------------------------------------------------------------------------- Vitals Details Patient Name: Date of Service: [place], [name] 06/12/2020 9:45 A M Medical Record Number: [id number] Patient Account Number: [id number] Date of Birth/Sex: Treating RN: January 14, 1951 (69 y.o. Helene Shoe, Meta.Reding Primary Care Psalm Arman: Jori Moll, Karsten Fells Other Clinician: Referring Jamiracle Avants: Treating Joannah Gitlin/Extender: Oliver Pila, BRITNEY Weeks in Treatment: 2 Vital Signs Time Taken: 09:55 Temperature (F): 98.1 Height (in): 65 Pulse (bpm): 82 Weight (lbs): 144 Respiratory Rate (breaths/min): 18 Body Mass Index (BMI): 24 Blood Pressure (mmHg): 174/77 Reference Range: 80 - 120 mg / dl Electronic Signature(s) Signed: 06/12/2020 4:53:33 PM By: Deon Pilling Entered By: Deon Pilling on 06/12/2020 10:05:08
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
## 2020-06-15 LAB — AEROBIC CULTURE W GRAM STAIN (SUPERFICIAL SPECIMEN)

## 2020-06-19 ENCOUNTER — Encounter (HOSPITAL_BASED_OUTPATIENT_CLINIC_OR_DEPARTMENT_OTHER): Payer: Medicare Other | Admitting: Physician Assistant

## 2020-06-19 ENCOUNTER — Other Ambulatory Visit: Payer: Self-pay

## 2020-06-19 DIAGNOSIS — L97822 Non-pressure chronic ulcer of other part of left lower leg with fat layer exposed: Secondary | ICD-10-CM | POA: Diagnosis not present
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
## 2020-06-20 DIAGNOSIS — S81802A Unspecified open wound, left lower leg, initial encounter: Secondary | ICD-10-CM | POA: Diagnosis not present

## 2020-06-20 NOTE — Progress Notes (Signed)
[name], [name] ([id number]) Visit Report for 06/19/2020 Arrival Information Details Patient Name: Date of Service: [place], [name] 06/19/2020 9:15 A M Medical Record Number: [id number] Patient Account Number: [id number] Date of Birth/Sex: Treating RN: 1951-02-22 (69 y.o. [name] Primary Care Riyaan Heroux: Jori Moll, Karsten Fells Other Clinician: Referring Jamyiah Labella: Treating Markea Ruzich/Extender: Oliver Pila, BRITNEY Weeks in Treatment: 3 Visit Information History Since Last Visit Added or deleted any medications: No Patient Arrived: Ambulatory Any new allergies or adverse reactions: No Arrival Time: 09:58 Had a fall or experienced change in No Accompanied By: self activities of daily living that may affect Transfer Assistance: None risk of falls: Patient Identification Verified: Yes Signs or symptoms of abuse/neglect since last visito No Secondary Verification Process Completed: Yes Hospitalized since last visit: No Patient Requires Transmission-Based Precautions: No Implantable device outside of the clinic excluding No Patient Has Alerts: Yes cellular tissue based products placed in the center Patient Alerts: Patient on Blood Thinner since last visit: L ABI non compressible Has Dressing in Place as Prescribed: Yes Pain Present Now: Yes Electronic Signature(s) Signed: 06/20/2020 2:35:34 PM By: Sandre Kitty Entered By: Sandre Kitty on 06/19/2020 10:00:04 -------------------------------------------------------------------------------- Clinic Level of Care Assessment Details Patient Name: Date of Service: [place], [name] 06/19/2020 9:15 A M Medical Record Number: [id number] Patient Account Number: [id number] Date of Birth/Sex: Treating RN: 1951/06/10 (69 y.o. [name] Primary Care Markiyah Gahm: Jori Moll, Karsten Fells Other Clinician: Referring Marny Smethers: Treating Lakesa Coste/Extender: Oliver Pila, BRITNEY Weeks in Treatment: 3 Clinic Level of Care  Assessment Items TOOL 4 Quantity Score []  - 0 Use when only an EandM is performed on FOLLOW-UP visit ASSESSMENTS - Nursing Assessment / Reassessment X- 1 10 Reassessment of Co-morbidities (includes updates in patient status) X- 1 5 Reassessment of Adherence to Treatment Plan ASSESSMENTS - Wound and Skin A ssessment / Reassessment []  - 0 Simple Wound Assessment / Reassessment - one wound X- 3 5 Complex Wound Assessment / Reassessment - multiple wounds []  - 0 Dermatologic / Skin Assessment (not related to wound area) ASSESSMENTS - Focused Assessment X- 1 5 Circumferential Edema Measurements - multi extremities []  - 0 Nutritional Assessment / Counseling / Intervention X- 1 5 Lower Extremity Assessment (monofilament, tuning fork, pulses) []  - 0 Peripheral Arterial Disease Assessment (using hand held doppler) ASSESSMENTS - Ostomy and/or Continence Assessment and Care []  - 0 Incontinence Assessment and Management []  - 0 Ostomy Care Assessment and Management (repouching, etc.) PROCESS - Coordination of Care X - Simple Patient / Family Education for ongoing care 1 15 []  - 0 Complex (extensive) Patient / Family Education for ongoing care X- 1 10 Staff obtains Programmer, systems, Records, T Results / Process Orders est []  - 0 Staff telephones HHA, Nursing Homes / Clarify orders / etc []  - 0 Routine Transfer to another Facility (non-emergent condition) []  - 0 Routine Hospital Admission (non-emergent condition) []  - 0 New Admissions / Biomedical engineer / Ordering NPWT Apligraf, etc. , []  - 0 Emergency Hospital Admission (emergent condition) X- 1 10 Simple Discharge Coordination []  - 0 Complex (extensive) Discharge Coordination PROCESS - Special Needs []  - 0 Pediatric / Minor Patient Management []  - 0 Isolation Patient Management []  - 0 Hearing / Language / Visual special needs []  - 0 Assessment of Community assistance (transportation, D/C planning, etc.) []  -  0 Additional assistance / Altered mentation []  - 0 Support Surface(s) Assessment (bed, cushion, seat, etc.) INTERVENTIONS - Wound Cleansing / Measurement []  -  0 Simple Wound Cleansing - one wound X- 3 5 Complex Wound Cleansing - multiple wounds X- 1 5 Wound Imaging (photographs - any number of wounds) []  - 0 Wound Tracing (instead of photographs) []  - 0 Simple Wound Measurement - one wound X- 3 5 Complex Wound Measurement - multiple wounds INTERVENTIONS - Wound Dressings X - Small Wound Dressing one or multiple wounds 3 10 []  - 0 Medium Wound Dressing one or multiple wounds []  - 0 Large Wound Dressing one or multiple wounds X- 1 5 Application of Medications - topical []  - 0 Application of Medications - injection INTERVENTIONS - Miscellaneous []  - 0 External ear exam []  - 0 Specimen Collection (cultures, biopsies, blood, body fluids, etc.) []  - 0 Specimen(s) / Culture(s) sent or taken to Lab for analysis []  - 0 Patient Transfer (multiple staff / Civil Service fast streamer / Similar devices) []  - 0 Simple Staple / Suture removal (25 or less) []  - 0 Complex Staple / Suture removal (26 or more) []  - 0 Hypo / Hyperglycemic Management (close monitor of Blood Glucose) []  - 0 Ankle / Brachial Index (ABI) - do not check if billed separately X- 1 5 Vital Signs Has the patient been seen at the hospital within the last three years: Yes Total Score: 150 Level Of Care: New/Established - Level 4 Electronic Signature(s) Signed: 06/19/2020 5:49:12 PM By: Baruch Gouty RN, [name] Entered By: Baruch Gouty on 06/19/2020 10:54:56 -------------------------------------------------------------------------------- Encounter Discharge Information Details Patient Name: Date of Service: [place], [name] 06/19/2020 9:15 A M Medical Record Number: [id number] Patient Account Number: [id number] Date of Birth/Sex: Treating RN: 1951-02-06 (69 y.o. [name] Primary Care Jodiann Ognibene: Jori Moll, Karsten Fells Other  Clinician: Referring Mya Suell: Treating Danielle Lento/Extender: Baron Hamper Weeks in Treatment: 3 Encounter Discharge Information Items Discharge Condition: Stable Ambulatory Status: [place] Discharge Destination: Home Transportation: Private Auto Accompanied By: self Schedule Follow-up Appointment: Yes Clinical Summary of Care: Patient Declined Electronic Signature(s) Signed: 06/19/2020 5:31:10 PM By: Carlene Coria RN Entered By: Carlene Coria on 06/19/2020 11:05:00 -------------------------------------------------------------------------------- Lower Extremity Assessment Details Patient Name: Date of Service: [place], [name] 06/19/2020 9:15 A M Medical Record Number: [id number] Patient Account Number: [id number] Date of Birth/Sex: Treating RN: 05-01-1951 (69 y.o. Nancy Fetter Primary Care Jerusalen Mateja: Jori Moll, Karsten Fells Other Clinician: Referring Casaundra Takacs: Treating Sylvester Minton/Extender: Oliver Pila, BRITNEY Weeks in Treatment: 3 Edema Assessment Assessed: [Left: No] [Right: No] Edema: [Left: Ye] [Right: s] Calf Left: Right: Point of Measurement: 28 cm From Medial Instep 31 cm cm Ankle Left: Right: Point of Measurement: 9 cm From Medial Instep 19.5 cm cm Vascular Assessment Pulses: Dorsalis Pedis Palpable: [Left:No] Electronic Signature(s) Signed: 06/19/2020 5:49:00 PM By: [name], [name] Entered By: Levan Hurst on 06/19/2020 10:28:41 -------------------------------------------------------------------------------- [place] Details Patient Name: Date of Service: [place], [name] 06/19/2020 9:15 A M Medical Record Number: [id number] Patient Account Number: [id number] Date of Birth/Sex: Treating RN: 10/17/1951 (69 y.o. [name] Primary Care Sharissa Brierley: Jori Moll, Karsten Fells Other Clinician: Referring Aneira Cavitt: Treating Makenze Ellett/Extender: Oliver Pila, BRITNEY Weeks in Treatment: 3 Active Inactive Pain,  Acute or Chronic Nursing Diagnoses: Pain, acute or chronic: actual or potential Potential alteration in comfort, pain Goals: Patient will verbalize adequate pain control and receive pain control interventions during procedures as needed Date Initiated: 05/29/2020 Target Resolution Date: 06/28/2020 Goal Status: Active Interventions: Provide education on pain management Reposition patient for comfort Treatment Activities: Administer pain control  measures as ordered : 05/29/2020 Notes: Wound/Skin Impairment Nursing Diagnoses: Knowledge deficit related to ulceration/compromised skin integrity Goals: Patient/caregiver will verbalize understanding of skin care regimen Date Initiated: 05/29/2020 Target Resolution Date: 06/28/2020 Goal Status: Active Interventions: Assess patient/caregiver ability to perform ulcer/skin care regimen upon admission and as needed Assess ulceration(s) every visit Provide education on ulcer and skin care Treatment Activities: Skin care regimen initiated : 05/29/2020 Topical wound management initiated : 05/29/2020 Notes: Electronic Signature(s) Signed: 06/19/2020 5:49:12 PM By: Baruch Gouty RN, [name] Entered By: Baruch Gouty on 06/19/2020 10:43:50 -------------------------------------------------------------------------------- Pain Assessment Details Patient Name: Date of Service: [name], [place] B. 06/19/2020 9:15 A M Medical Record Number: [id number] Patient Account Number: [id number] Date of Birth/Sex: Treating RN: August 18, 1951 (69 y.o. [name] Primary Care Westley Blass: Jori Moll, Karsten Fells Other Clinician: Referring Jahrell Hamor: Treating Tracey Hermance/Extender: Oliver Pila, BRITNEY Weeks in Treatment: 3 Active Problems Location of Pain Severity and Description of Pain Patient Has Paino Yes Site Locations Rate the pain. Current Pain Level: 7 Pain Management and Medication Current Pain Management: Electronic Signature(s) Signed: 06/19/2020 5:49:12  PM By: Baruch Gouty RN, [name] Signed: 06/20/2020 2:35:34 PM By: Sandre Kitty Entered By: Sandre Kitty on 06/19/2020 10:00:37 -------------------------------------------------------------------------------- Patient/Caregiver Education Details Patient Name: Date of Service: Marguerita [place], [name] 6/30/2021andnbsp9:15 A M Medical Record Number: [id number] Patient Account Number: [id number] Date of Birth/Gender: Treating RN: [birth year] (69 y.o. [name] Primary Care Physician: Jori Moll, Karsten Fells Other Clinician: Referring Physician: Treating Physician/Extender: Baron Hamper Weeks in Treatment: 3 Education Assessment Education Provided To: Patient Education Topics Provided Tissue Oxygenation: Methods: Explain/Verbal Responses: Reinforcements needed, State content correctly Wound/Skin Impairment: Methods: Explain/Verbal Responses: Reinforcements needed, State content correctly Electronic Signature(s) Signed: 06/19/2020 5:49:12 PM By: Baruch Gouty RN, [name] Entered By: Baruch Gouty on 06/19/2020 10:45:33 -------------------------------------------------------------------------------- Wound Assessment Details Patient Name: Date of Service: [name], [name] 06/19/2020 9:15 A M Medical Record Number: [id number] Patient Account Number: [id number] Date of Birth/Sex: Treating RN: 01-13-1951 (69 y.o. Martyn Malay, [name] Primary Care Muskan Bolla: Jori Moll, Karsten Fells Other Clinician: Referring Otie Headlee: Treating Evalynne Locurto/Extender: Oliver Pila, BRITNEY Weeks in Treatment: 3 Wound Status Wound Number: 7 Primary Venous Leg Ulcer Etiology: Wound Location: Left, Anterior Lower Leg Wound Open Wounding Event: Blister Status: Date Acquired: 04/11/2020 Comorbid Glaucoma, Arrhythmia, Hypertension, Peripheral Arterial Disease, Weeks Of Treatment: 3 History: Peripheral Venous Disease, Gout, Osteoarthritis Clustered Wound: No Photos Photo Uploaded By: Mikeal Hawthorne on 06/19/2020 14:31:13 Wound Measurements Length: (cm) 0.5 Width: (cm) 0.8 Depth: (cm) 0.1 Area: (cm) 0.314 Volume: (cm) 0.031 % Reduction in Area: 4.8% % Reduction in Volume: 6.1% Epithelialization: Small (1-33%) Tunneling: No Undermining: No Wound Description Classification: Full Thickness Without Exposed Support Structures Wound Margin: Flat and Intact Exudate Amount: Medium Exudate Type: Serosanguineous Exudate Color: red, brown Foul Odor After Cleansing: No Slough/Fibrino No Wound Bed Granulation Amount: Large (67-100%) Exposed Structure Granulation Quality: Pink Fascia Exposed: No Necrotic Amount: None Present (0%) Fat Layer (Subcutaneous Tissue) Exposed: Yes Tendon Exposed: No Muscle Exposed: No Joint Exposed: No Bone Exposed: No Treatment Notes Wound #7 (Left, Anterior Lower Leg) 1. Cleanse With Wound Cleanser 3. Primary Dressing Applied Calcium Alginate Ag 4. Secondary Dressing ABD Pad Roll Gauze 5. Secured With Recruitment consultant) Signed: 06/19/2020 5:49:00 PM By: [name], [name] Signed: 06/19/2020 5:49:12 PM By: Baruch Gouty RN, [name] Entered By: Levan Hurst on 06/19/2020 10:30:49 -------------------------------------------------------------------------------- Wound Assessment Details Patient Name: Date of Service: [name], [place]  Rosina Lowenstein 06/19/2020 9:15 A M Medical Record Number: [id number] Patient Account Number: [id number] Date of Birth/Sex: Treating RN: Oct 06, 1951 (69 y.o. Martyn Malay, [name] Primary Care Bhavesh Vazquez: Jori Moll, Karsten Fells Other Clinician: Referring Joleene Burnham: Treating Sherolyn Trettin/Extender: Oliver Pila, BRITNEY Weeks in Treatment: 3 Wound Status Wound Number: 8 Primary Venous Leg Ulcer Etiology: Wound Location: Left, Medial Lower Leg Wound Open Wounding Event: Blister Status: Date Acquired: 04/11/2020 Comorbid Glaucoma, Arrhythmia, Hypertension, Peripheral Arterial Disease, Weeks Of Treatment:  3 History: Peripheral Venous Disease, Gout, Osteoarthritis Clustered Wound: Yes Photos Photo Uploaded By: Mikeal Hawthorne on 06/19/2020 14:31:27 Wound Measurements Length: (cm) 5.5 Width: (cm) 1.4 Depth: (cm) 0.1 Clustered Quantity: 2 Area: (cm) 6.048 Volume: (cm) 0.605 % Reduction in Area: 91.6% % Reduction in Volume: 91.6% Epithelialization: None Tunneling: No Undermining: No Wound Description Classification: Full Thickness Without Exposed Support Structures Wound Margin: Flat and Intact Exudate Amount: Medium Exudate Type: Serous Exudate Color: amber Foul Odor After Cleansing: No Slough/Fibrino Yes Wound Bed Granulation Amount: Medium (34-66%) Exposed Structure Granulation Quality: Pink, Pale Fascia Exposed: No Necrotic Amount: Medium (34-66%) Fat Layer (Subcutaneous Tissue) Exposed: Yes Necrotic Quality: Adherent Slough Tendon Exposed: No Muscle Exposed: No Joint Exposed: No Bone Exposed: No Treatment Notes Wound #8 (Left, Medial Lower Leg) 1. Cleanse With Wound Cleanser 3. Primary Dressing Applied Calcium Alginate Ag 4. Secondary Dressing ABD Pad Roll Gauze 5. Secured With Recruitment consultant) Signed: 06/19/2020 5:49:00 PM By: [name], [name] Signed: 06/19/2020 5:49:12 PM By: Baruch Gouty RN, [name] Entered By: Levan Hurst on 06/19/2020 10:31:22 -------------------------------------------------------------------------------- Wound Assessment Details Patient Name: Date of Service: [name], [name] 06/19/2020 9:15 A M Medical Record Number: [id number] Patient Account Number: [id number] Date of Birth/Sex: Treating RN: Feb 19, 1951 (69 y.o. Martyn Malay, [name] Primary Care Bertram Haddix: Jori Moll, Karsten Fells Other Clinician: Referring Saheed Carrington: Treating Tonica Brasington/Extender: Oliver Pila, BRITNEY Weeks in Treatment: 3 Wound Status Wound Number: 9 Primary Venous Leg Ulcer Etiology: Wound Location: Left, Posterior Lower Leg Wound  Open Wounding Event: Gradually Appeared Status: Date Acquired: 06/05/2020 Comorbid Glaucoma, Arrhythmia, Hypertension, Peripheral Arterial Disease, Weeks Of Treatment: 2 History: Peripheral Venous Disease, Gout, Osteoarthritis Clustered Wound: Yes Photos Photo Uploaded By: Mikeal Hawthorne on 06/19/2020 14:30:46 Wound Measurements Length: (cm) 9 Width: (cm) 1.5 Depth: (cm) 0.1 Clustered Quantity: 3 Area: (cm) 10.603 Volume: (cm) 1.06 % Reduction in Area: -37.8% % Reduction in Volume: -37.7% Epithelialization: Small (1-33%) Tunneling: No Undermining: No Wound Description Classification: Full Thickness Without Exposed Support Structures Wound Margin: Distinct, outline attached Exudate Amount: Medium Exudate Type: Serosanguineous Exudate Color: red, brown Foul Odor After Cleansing: No Slough/Fibrino Yes Wound Bed Granulation Amount: Medium (34-66%) Exposed Structure Granulation Quality: Pink, Pale Fascia Exposed: No Necrotic Amount: Medium (34-66%) Fat Layer (Subcutaneous Tissue) Exposed: Yes Necrotic Quality: Adherent Slough Tendon Exposed: No Muscle Exposed: No Joint Exposed: No Bone Exposed: No Treatment Notes Wound #9 (Left, Posterior Lower Leg) 1. Cleanse With Wound Cleanser 3. Primary Dressing Applied Calcium Alginate Ag 4. Secondary Dressing ABD Pad Roll Gauze 5. Secured With Recruitment consultant) Signed: 06/19/2020 5:49:00 PM By: [name], [name] Signed: 06/19/2020 5:49:12 PM By: Baruch Gouty RN, [name] Entered By: Levan Hurst on 06/19/2020 10:31:45 -------------------------------------------------------------------------------- Vitals Details Patient Name: Date of Service: [name], [name] 06/19/2020 9:15 A M Medical Record Number: [id number] Patient Account Number: [id number] Date of Birth/Sex: Treating RN: Mar 18, 1951 (69 y.o. [name] Primary Care Homero Hyson: Jori Moll, Karsten Fells Other Clinician: Referring Zaryan Yakubov: Treating  Armida Vickroy/Extender: Melburn Hake,  Eunice Blase YCE, BRITNEY Weeks in Treatment: 3 Vital Signs Time Taken: 10:00 Temperature (F): 98.1 Height (in): 65 Pulse (bpm): 67 Weight (lbs): 144 Respiratory Rate (breaths/min): 18 Body Mass Index (BMI): 24 Blood Pressure (mmHg): 165/66 Reference Range: 80 - 120 mg / dl Electronic Signature(s) Signed: 06/20/2020 2:35:34 PM By: Sandre Kitty Entered By: Sandre Kitty on 06/19/2020 10:00:24

## 2020-06-21 ENCOUNTER — Other Ambulatory Visit: Payer: Self-pay

## 2020-06-21 ENCOUNTER — Other Ambulatory Visit: Payer: Self-pay | Admitting: Family Medicine

## 2020-06-21 MED ORDER — FLECAINIDE ACETATE 50 MG PO TABS: 50.0000 mg | ORAL_TABLET | Freq: Two times a day (BID) | ORAL | 11 refills | Status: DC

## 2020-06-21 NOTE — Telephone Encounter (Signed)
°  Prescription Request  06/21/2020  What is the name of the medication or equipment? Flecinide 50 mg - Patient has aoppot 7-9 and needs enough to get her through  Have you contacted your pharmacy to request a refill? (if applicable) YES   Which pharmacy would you like this sent to? [facility]   Patient notified that their request is being sent to the clinical staff for review and that they should receive a response within 2 business days.

## 2020-06-21 NOTE — Telephone Encounter (Signed)
Flecainide denied Patient needs to be seen Last office visit 02/09/2020

## 2020-06-21 NOTE — Telephone Encounter (Signed)
Lmtcb to schedule an appt for medication refill

## 2020-06-21 NOTE — Telephone Encounter (Signed)
Patient notified that medication is being sent in.

## 2020-06-22 DIAGNOSIS — L509 Urticaria, unspecified: Secondary | ICD-10-CM | POA: Diagnosis not present

## 2020-06-22 DIAGNOSIS — R21 Rash and other nonspecific skin eruption: Secondary | ICD-10-CM | POA: Diagnosis not present

## 2020-06-26 ENCOUNTER — Other Ambulatory Visit: Payer: Self-pay

## 2020-06-26 ENCOUNTER — Encounter (HOSPITAL_BASED_OUTPATIENT_CLINIC_OR_DEPARTMENT_OTHER): Payer: Medicare Other | Attending: Physician Assistant | Admitting: Physician Assistant

## 2020-06-26 DIAGNOSIS — I480 Paroxysmal atrial fibrillation: Secondary | ICD-10-CM | POA: Diagnosis not present

## 2020-06-26 DIAGNOSIS — I10 Essential (primary) hypertension: Secondary | ICD-10-CM | POA: Insufficient documentation

## 2020-06-26 DIAGNOSIS — I872 Venous insufficiency (chronic) (peripheral): Secondary | ICD-10-CM | POA: Insufficient documentation

## 2020-06-26 DIAGNOSIS — M109 Gout, unspecified: Secondary | ICD-10-CM | POA: Diagnosis not present

## 2020-06-26 DIAGNOSIS — Z89611 Acquired absence of right leg above knee: Secondary | ICD-10-CM | POA: Insufficient documentation

## 2020-06-26 DIAGNOSIS — Z8673 Personal history of transient ischemic attack (TIA), and cerebral infarction without residual deficits: Secondary | ICD-10-CM | POA: Insufficient documentation

## 2020-06-26 DIAGNOSIS — Z86718 Personal history of other venous thrombosis and embolism: Secondary | ICD-10-CM | POA: Insufficient documentation

## 2020-06-26 DIAGNOSIS — L97822 Non-pressure chronic ulcer of other part of left lower leg with fat layer exposed: Secondary | ICD-10-CM | POA: Diagnosis not present

## 2020-06-26 DIAGNOSIS — Z7901 Long term (current) use of anticoagulants: Secondary | ICD-10-CM | POA: Insufficient documentation

## 2020-06-26 DIAGNOSIS — D45 Polycythemia vera: Secondary | ICD-10-CM | POA: Insufficient documentation

## 2020-06-26 DIAGNOSIS — Z89511 Acquired absence of right leg below knee: Secondary | ICD-10-CM | POA: Insufficient documentation

## 2020-06-26 DIAGNOSIS — H409 Unspecified glaucoma: Secondary | ICD-10-CM | POA: Insufficient documentation

## 2020-06-26 DIAGNOSIS — M1990 Unspecified osteoarthritis, unspecified site: Secondary | ICD-10-CM | POA: Diagnosis not present

## 2020-06-26 DIAGNOSIS — Z7689 Persons encountering health services in other specified circumstances: Secondary | ICD-10-CM | POA: Diagnosis not present

## 2020-06-26 DIAGNOSIS — I739 Peripheral vascular disease, unspecified: Secondary | ICD-10-CM | POA: Insufficient documentation

## 2020-06-26 DIAGNOSIS — I7389 Other specified peripheral vascular diseases: Secondary | ICD-10-CM | POA: Diagnosis not present
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
## 2020-06-26 NOTE — Progress Notes (Signed)
[name], [name] ([id number]) Visit Report for 06/26/2020 Arrival Information Details Patient Name: Date of Service: [name], [name] 06/26/2020 12:45 PM Medical Record Number: [id number] Patient Account Number: [id number] Date of Birth/Sex: Treating RN: 1951-04-21 (69 y.o. [name] Primary Care [name]: [name], [name] Other Clinician: Referring [name]: Treating [name]/Extender: [name], [name] Weeks in Treatment: 4 Visit Information History Since Last Visit Added or deleted any medications: No Patient Arrived: [place] Any new allergies or adverse reactions: No Arrival Time: 12:52 Had a fall or experienced change in No Accompanied By: alone activities of daily living that may affect Transfer Assistance: None risk of falls: Patient Identification Verified: Yes Signs or symptoms of abuse/neglect since last visito No Secondary Verification Process Completed: Yes Hospitalized since last visit: No Patient Requires Transmission-Based Precautions: No Implantable device outside of the clinic excluding No Patient Has Alerts: Yes cellular tissue based products placed in the center Patient Alerts: Patient on Blood Thinner since last visit: L ABI non compressible Has Dressing in Place as Prescribed: Yes Pain Present Now: No Electronic Signature(s) Signed: 06/26/2020 5:25:38 PM By: [name], [name] Entered By: Levan Hurst on 06/26/2020 12:54:36 -------------------------------------------------------------------------------- Clinic Level of Care Assessment Details Patient Name: Date of Service: [name], [name] 06/26/2020 12:45 PM Medical Record Number: [id number] Patient Account Number: [id number] Date of Birth/Sex: Treating RN: Feb 03, 1951 (69 y.o. [name] Primary Care [name]: [name], [name] Other Clinician: Referring [name]: Treating [name]/Extender: [name], [name] Weeks in Treatment: 4 Clinic Level of Care Assessment  Items TOOL 4 Quantity Score []  - 0 Use when only an EandM is performed on FOLLOW-UP visit ASSESSMENTS - Nursing Assessment / Reassessment X- 1 10 Reassessment of Co-morbidities (includes updates in patient status) X- 1 5 Reassessment of Adherence to Treatment Plan ASSESSMENTS - Wound and Skin A ssessment / Reassessment []  - 0 Simple Wound Assessment / Reassessment - one wound X- 3 5 Complex Wound Assessment / Reassessment - multiple wounds []  - 0 Dermatologic / Skin Assessment (not related to wound area) ASSESSMENTS - Focused Assessment X- 1 5 Circumferential Edema Measurements - multi extremities []  - 0 Nutritional Assessment / Counseling / Intervention X- 1 5 Lower Extremity Assessment (monofilament, tuning fork, pulses) []  - 0 Peripheral Arterial Disease Assessment (using hand held doppler) ASSESSMENTS - Ostomy and/or Continence Assessment and Care []  - 0 Incontinence Assessment and Management []  - 0 Ostomy Care Assessment and Management (repouching, etc.) PROCESS - Coordination of Care X - Simple Patient / Family Education for ongoing care 1 15 []  - 0 Complex (extensive) Patient / Family Education for ongoing care X- 1 10 Staff obtains Programmer, systems, Records, T Results / Process Orders est []  - 0 Staff telephones HHA, Nursing Homes / Clarify orders / etc []  - 0 Routine Transfer to another Facility (non-emergent condition) []  - 0 Routine Hospital Admission (non-emergent condition) []  - 0 New Admissions / Biomedical engineer / Ordering NPWT Apligraf, etc. , []  - 0 Emergency Hospital Admission (emergent condition) X- 1 10 Simple Discharge Coordination []  - 0 Complex (extensive) Discharge Coordination PROCESS - Special Needs []  - 0 Pediatric / Minor Patient Management []  - 0 Isolation Patient Management []  - 0 Hearing / Language / Visual special needs []  - 0 Assessment of Community assistance (transportation, D/C planning, etc.) []  - 0 Additional  assistance / Altered mentation []  - 0 Support Surface(s) Assessment (bed, cushion, seat, etc.) INTERVENTIONS - Wound Cleansing / Measurement []  -  0 Simple Wound Cleansing - one wound X- 3 5 Complex Wound Cleansing - multiple wounds X- 1 5 Wound Imaging (photographs - any number of wounds) []  - 0 Wound Tracing (instead of photographs) []  - 0 Simple Wound Measurement - one wound X- 3 5 Complex Wound Measurement - multiple wounds INTERVENTIONS - Wound Dressings X - Small Wound Dressing one or multiple wounds 3 10 []  - 0 Medium Wound Dressing one or multiple wounds []  - 0 Large Wound Dressing one or multiple wounds X- 1 5 Application of Medications - topical []  - 0 Application of Medications - injection INTERVENTIONS - Miscellaneous []  - 0 External ear exam []  - 0 Specimen Collection (cultures, biopsies, blood, body fluids, etc.) []  - 0 Specimen(s) / Culture(s) sent or taken to Lab for analysis []  - 0 Patient Transfer (multiple staff / Civil Service fast streamer / Similar devices) []  - 0 Simple Staple / Suture removal (25 or less) []  - 0 Complex Staple / Suture removal (26 or more) []  - 0 Hypo / Hyperglycemic Management (close monitor of Blood Glucose) []  - 0 Ankle / Brachial Index (ABI) - do not check if billed separately X- 1 5 Vital Signs Has the patient been seen at the hospital within the last three years: Yes Total Score: 150 Level Of Care: New/Established - Level 4 Electronic Signature(s) Signed: 06/26/2020 5:28:41 PM By: Baruch Gouty RN, [name] Entered By: Baruch Gouty on 06/26/2020 13:14:44 -------------------------------------------------------------------------------- Encounter Discharge Information Details Patient Name: Date of Service: [name], [name] 06/26/2020 12:45 PM Medical Record Number: [id number] Patient Account Number: [id number] Date of Birth/Sex: Treating RN: 04-29-1951 (69 y.o. [name] Primary Care [name]: [name], [name] Other  Clinician: Referring [name]: Treating [name]/Extender: [name] Weeks in Treatment: 4 Encounter Discharge Information Items Discharge Condition: Stable Ambulatory Status: [place] Discharge Destination: Home Transportation: Private Auto Accompanied By: self Schedule Follow-up Appointment: Yes Clinical Summary of Care: Patient Declined Electronic Signature(s) Signed: 06/26/2020 5:22:20 PM By: Carlene Coria RN Entered By: Carlene Coria on 06/26/2020 13:27:35 -------------------------------------------------------------------------------- Lower Extremity Assessment Details Patient Name: Date of Service: [place], [name] 06/26/2020 12:45 PM Medical Record Number: [id number] Patient Account Number: [id number] Date of Birth/Sex: Treating RN: 1951-01-27 (69 y.o. [name] Primary Care Agness Sibrian: [name], [name] Other Clinician: Referring Marceline Napierala: Treating Amora Sheehy/Extender: [name], [name] Weeks in Treatment: 4 Edema Assessment Assessed: [Left: No] [Right: No] Edema: [Left: Ye] [Right: s] Calf Left: Right: Point of Measurement: 28 cm From Medial Instep 31 cm cm Ankle Left: Right: Point of Measurement: 9 cm From Medial Instep 19.5 cm cm Vascular Assessment Pulses: Dorsalis Pedis Palpable: [Left:Yes] Electronic Signature(s) Signed: 06/26/2020 5:25:38 PM By: [name], [name] Entered By: Levan Hurst on 06/26/2020 13:03:54 -------------------------------------------------------------------------------- [place] Details Patient Name: Date of Service: [name], [name] 06/26/2020 12:45 PM Medical Record Number: [id number] Patient Account Number: [id number] Date of Birth/Sex: Treating RN: 1951/05/15 (69 y.o. [name] Primary Care Bernette Seeman: [name], [name] Other Clinician: Referring Oneisha Ammons: Treating Elysia Grand/Extender: [name], [name] Weeks in Treatment: 4 Active Inactive Pain,  Acute or Chronic Nursing Diagnoses: Pain, acute or chronic: actual or potential Potential alteration in comfort, pain Goals: Patient will verbalize adequate pain control and receive pain control interventions during procedures as needed Date Initiated: 05/29/2020 Target Resolution Date: 07/24/2020 Goal Status: Active Interventions: Provide education on pain management Reposition patient for comfort Treatment Activities: Administer pain control measures as ordered :  05/29/2020 Notes: Wound/Skin Impairment Nursing Diagnoses: Knowledge deficit related to ulceration/compromised skin integrity Goals: Patient/caregiver will verbalize understanding of skin care regimen Date Initiated: 05/29/2020 Target Resolution Date: 07/24/2020 Goal Status: Active Interventions: Assess patient/caregiver ability to perform ulcer/skin care regimen upon admission and as needed Assess ulceration(s) every visit Provide education on ulcer and skin care Treatment Activities: Skin care regimen initiated : 05/29/2020 Topical wound management initiated : 05/29/2020 Notes: Electronic Signature(s) Signed: 06/26/2020 5:28:41 PM By: Baruch Gouty RN, [name] Entered By: Baruch Gouty on 06/26/2020 13:00:32 -------------------------------------------------------------------------------- Pain Assessment Details Patient Name: Date of Service: [name], [name] B. 06/26/2020 12:45 PM Medical Record Number: [id number] Patient Account Number: [id number] Date of Birth/Sex: Treating RN: 1951/10/24 (69 y.o. [name] Primary Care Zaila Crew: [name], [name] Other Clinician: Referring Shakeema Lippman: Treating Odessia Asleson/Extender: [name], [name] Weeks in Treatment: 4 Active Problems Location of Pain Severity and Description of Pain Patient Has Paino No Site Locations Pain Management and Medication Current Pain Management: Electronic Signature(s) Signed: 06/26/2020 5:25:38 PM By: [name], [name] Entered By:  Levan Hurst on 06/26/2020 12:54:56 -------------------------------------------------------------------------------- Patient/Caregiver Education Details Patient Name: Date of Service: [name], [name] 7/7/2021andnbsp12:45 PM Medical Record Number: [id number] Patient Account Number: [id number] Date of Birth/Gender: Treating RN: Sep 10, 1951 (69 y.o. [name] Primary Care Physician: [name], [name] Other Clinician: Referring Physician: Treating Physician/Extender: [name] Weeks in Treatment: 4 Education Assessment Education Provided To: Patient Education Topics Provided Tissue Oxygenation: Methods: Explain/Verbal Responses: Reinforcements needed, State content correctly Wound/Skin Impairment: Methods: Explain/Verbal Responses: Reinforcements needed, State content correctly Electronic Signature(s) Signed: 06/26/2020 5:28:41 PM By: Baruch Gouty RN, [name] Entered By: Baruch Gouty on 06/26/2020 13:00:57 -------------------------------------------------------------------------------- Wound Assessment Details Patient Name: Date of Service: [name], [name] B. 06/26/2020 12:45 PM Medical Record Number: [id number] Patient Account Number: [id number] Date of Birth/Sex: Treating RN: [birth year] (69 y.o. [name] Primary Care Shainna Faux: [name], [name] Other Clinician: Referring Elodie Panameno: Treating Henchy Mccauley/Extender: [name], [name] Weeks in Treatment: 4 Wound Status Wound Number: 7 Primary Venous Leg Ulcer Etiology: Wound Location: Left, Anterior Lower Leg Wound Open Wounding Event: Blister Status: Date Acquired: 04/11/2020 Comorbid Glaucoma, Arrhythmia, Hypertension, Peripheral Arterial Disease, Weeks Of Treatment: 4 History: Peripheral Venous Disease, Gout, Osteoarthritis Clustered Wound: No Wound Measurements Length: (cm) 0.5 Width: (cm) 0.4 Depth: (cm) 0.1 Area: (cm) 0.157 Volume: (cm) 0.016 % Reduction in Area:  52.4% % Reduction in Volume: 51.5% Epithelialization: Medium (34-66%) Tunneling: No Undermining: No Wound Description Classification: Full Thickness Without Exposed Support Structures Wound Margin: Flat and Intact Exudate Amount: Medium Exudate Type: Serosanguineous Exudate Color: red, brown Foul Odor After Cleansing: No Slough/Fibrino No Wound Bed Granulation Amount: Medium (34-66%) Exposed Structure Granulation Quality: Pink, Pale Fascia Exposed: No Necrotic Amount: Medium (34-66%) Fat Layer (Subcutaneous Tissue) Exposed: Yes Necrotic Quality: Adherent Slough Tendon Exposed: No Muscle Exposed: No Joint Exposed: No Bone Exposed: No Treatment Notes Wound #7 (Left, Anterior Lower Leg) 1. Cleanse With Wound Cleanser 3. Primary Dressing Applied Calcium Alginate Ag 4. Secondary Dressing Dry Gauze Roll Gauze 5. Secured With Recruitment consultant) Signed: 06/26/2020 5:25:38 PM By: [name], [name] Entered By: Levan Hurst on 06/26/2020 13:02:58 -------------------------------------------------------------------------------- Wound Assessment Details Patient Name: Date of Service: [name], [name] 06/26/2020 12:45 PM Medical Record Number: [id number] Patient Account Number: [id number] Date of Birth/Sex: Treating RN: August 14, 1951 (69 y.o. [name] Primary Care Jenniger Figiel: [name], [name] Other Clinician: Referring Mecca Barga: Treating  Taren Toops/Extender: [name], [name] Weeks in Treatment: 4 Wound Status Wound Number: 8 Primary Venous Leg Ulcer Etiology: Wound Location: Left, Medial Lower Leg Wound Open Wounding Event: Blister Status: Date Acquired: 04/11/2020 Comorbid Glaucoma, Arrhythmia, Hypertension, Peripheral Arterial Disease, Weeks Of Treatment: 4 History: Peripheral Venous Disease, Gout, Osteoarthritis Clustered Wound: Yes Wound Measurements Length: (cm) 0.7 Width: (cm) 0.8 Depth: (cm) 0.1 Clustered Quantity: 1 Area: (cm)  0.44 Volume: (cm) 0.044 % Reduction in Area: 99.4% % Reduction in Volume: 99.4% Epithelialization: Small (1-33%) Tunneling: No Undermining: No Wound Description Classification: Full Thickness Without Exposed Support Structures Wound Margin: Flat and Intact Exudate Amount: Medium Exudate Type: Serous Exudate Color: amber Foul Odor After Cleansing: No Slough/Fibrino Yes Wound Bed Granulation Amount: None Present (0%) Exposed Structure Necrotic Amount: Large (67-100%) Fascia Exposed: No Necrotic Quality: Adherent Slough Fat Layer (Subcutaneous Tissue) Exposed: Yes Tendon Exposed: No Muscle Exposed: No Joint Exposed: No Bone Exposed: No Treatment Notes Wound #8 (Left, Medial Lower Leg) 1. Cleanse With Wound Cleanser 3. Primary Dressing Applied Calcium Alginate Ag 4. Secondary Dressing Dry Gauze Roll Gauze 5. Secured With Recruitment consultant) Signed: 06/26/2020 5:25:38 PM By: [name], [name] Entered By: Levan Hurst on 06/26/2020 13:03:19 -------------------------------------------------------------------------------- Wound Assessment Details Patient Name: Date of Service: [name], [name] 06/26/2020 12:45 PM Medical Record Number: [id number] Patient Account Number: [id number] Date of Birth/Sex: Treating RN: 11-23-1951 (69 y.o. [name] Primary Care Gerrald Basu: [name], [name] Other Clinician: Referring Sheniece Ruggles: Treating Tillie Viverette/Extender: [name], [name] Weeks in Treatment: 4 Wound Status Wound Number: 9 Primary Venous Leg Ulcer Etiology: Wound Location: Left, Posterior Lower Leg Wound Open Wounding Event: Gradually Appeared Status: Date Acquired: 06/05/2020 Comorbid Glaucoma, Arrhythmia, Hypertension, Peripheral Arterial Disease, Weeks Of Treatment: 3 History: Peripheral Venous Disease, Gout, Osteoarthritis Clustered Wound: Yes Wound Measurements Length: (cm) 13 Width: (cm) 1.5 Depth: (cm) 0.1 Clustered Quantity:  4 Area: (cm) 15.315 Volume: (cm) 1.532 % Reduction in Area: -99% % Reduction in Volume: -99% Epithelialization: Small (1-33%) Tunneling: No Undermining: No Wound Description Classification: Full Thickness Without Exposed Support Structures Wound Margin: Distinct, outline attached Exudate Amount: Medium Exudate Type: Serosanguineous Exudate Color: red, brown Foul Odor After Cleansing: No Slough/Fibrino Yes Wound Bed Granulation Amount: Medium (34-66%) Exposed Structure Granulation Quality: Pink, Pale Fascia Exposed: No Necrotic Amount: Medium (34-66%) Fat Layer (Subcutaneous Tissue) Exposed: Yes Necrotic Quality: Adherent Slough Tendon Exposed: No Muscle Exposed: No Joint Exposed: No Bone Exposed: No Treatment Notes Wound #9 (Left, Posterior Lower Leg) 1. Cleanse With Wound Cleanser 3. Primary Dressing Applied Calcium Alginate Ag 4. Secondary Dressing Dry Gauze Roll Gauze 5. Secured With Recruitment consultant) Signed: 06/26/2020 5:25:38 PM By: [name], [name] Entered By: Levan Hurst on 06/26/2020 13:03:41 -------------------------------------------------------------------------------- [place] Details Patient Name: Date of Service: [name], [name] 06/26/2020 12:45 PM Medical Record Number: [id number] Patient Account Number: [id number] Date of Birth/Sex: Treating RN: [birth year] (69 y.o. [name] Primary Care Rainah Kirshner: [name], [name] Other Clinician: Referring Tema Alire: Treating Jennett Tarbell/Extender: [name], [name] Weeks in Treatment: 4 Vital Signs Time Taken: 12:54 Temperature (F): 98.5 Height (in): 65 Pulse (bpm): 75 Weight (lbs): 144 Respiratory Rate (breaths/min): 16 Body Mass Index (BMI): 24 Blood Pressure (mmHg): 161/63 Reference Range: 80 - 120 mg / dl Electronic Signature(s) Signed: 06/26/2020 5:25:38 PM By: [name], [name] Entered By: Levan Hurst on 06/26/2020 12:54:52

## 2020-06-27 ENCOUNTER — Inpatient Hospital Stay: Payer: Medicare Other | Attending: Hematology

## 2020-06-27 ENCOUNTER — Other Ambulatory Visit: Payer: Self-pay

## 2020-06-27 DIAGNOSIS — R7989 Other specified abnormal findings of blood chemistry: Secondary | ICD-10-CM | POA: Diagnosis not present

## 2020-06-27 DIAGNOSIS — E785 Hyperlipidemia, unspecified: Secondary | ICD-10-CM | POA: Diagnosis not present

## 2020-06-27 DIAGNOSIS — Z7982 Long term (current) use of aspirin: Secondary | ICD-10-CM | POA: Diagnosis not present

## 2020-06-27 DIAGNOSIS — Z7901 Long term (current) use of anticoagulants: Secondary | ICD-10-CM | POA: Diagnosis not present

## 2020-06-27 DIAGNOSIS — D509 Iron deficiency anemia, unspecified: Secondary | ICD-10-CM | POA: Diagnosis not present

## 2020-06-27 DIAGNOSIS — Z8673 Personal history of transient ischemic attack (TIA), and cerebral infarction without residual deficits: Secondary | ICD-10-CM | POA: Insufficient documentation

## 2020-06-27 DIAGNOSIS — I10 Essential (primary) hypertension: Secondary | ICD-10-CM | POA: Diagnosis not present

## 2020-06-27 DIAGNOSIS — I252 Old myocardial infarction: Secondary | ICD-10-CM | POA: Insufficient documentation

## 2020-06-27 DIAGNOSIS — Z79899 Other long term (current) drug therapy: Secondary | ICD-10-CM | POA: Insufficient documentation

## 2020-06-27 DIAGNOSIS — I480 Paroxysmal atrial fibrillation: Secondary | ICD-10-CM | POA: Insufficient documentation

## 2020-06-27 DIAGNOSIS — Z7952 Long term (current) use of systemic steroids: Secondary | ICD-10-CM | POA: Diagnosis not present

## 2020-06-27 DIAGNOSIS — Z89611 Acquired absence of right leg above knee: Secondary | ICD-10-CM | POA: Insufficient documentation

## 2020-06-27 DIAGNOSIS — Z86718 Personal history of other venous thrombosis and embolism: Secondary | ICD-10-CM | POA: Insufficient documentation

## 2020-06-27 DIAGNOSIS — D45 Polycythemia vera: Secondary | ICD-10-CM | POA: Insufficient documentation

## 2020-06-27 DIAGNOSIS — Z7689 Persons encountering health services in other specified circumstances: Secondary | ICD-10-CM | POA: Diagnosis not present

## 2020-06-27 LAB — CBC WITH DIFFERENTIAL (CANCER CENTER ONLY)
Abs Immature Granulocytes: 0.3 10*3/uL — ABNORMAL HIGH (ref 0.00–0.07)
Basophils Absolute: 0.1 10*3/uL (ref 0.0–0.1)
Basophils Relative: 1 %
Eosinophils Absolute: 0.6 10*3/uL — ABNORMAL HIGH (ref 0.0–0.5)
Eosinophils Relative: 3 %
HCT: 34.9 % — ABNORMAL LOW (ref 36.0–46.0)
Hemoglobin: 10 g/dL — ABNORMAL LOW (ref 12.0–15.0)
Immature Granulocytes: 2 %
Lymphocytes Relative: 4 %
Lymphs Abs: 0.8 10*3/uL (ref 0.7–4.0)
MCH: 21.8 pg — ABNORMAL LOW (ref 26.0–34.0)
MCHC: 28.7 g/dL — ABNORMAL LOW (ref 30.0–36.0)
MCV: 76 fL — ABNORMAL LOW (ref 80.0–100.0)
Monocytes Absolute: 0.6 10*3/uL (ref 0.1–1.0)
Monocytes Relative: 3 %
Neutro Abs: 18.1 10*3/uL — ABNORMAL HIGH (ref 1.7–7.7)
Neutrophils Relative %: 87 %
Platelet Count: 549 10*3/uL — ABNORMAL HIGH (ref 150–400)
RBC: 4.59 MIL/uL (ref 3.87–5.11)
RDW: 19.1 % — ABNORMAL HIGH (ref 11.5–15.5)
WBC Count: 20.5 10*3/uL — ABNORMAL HIGH (ref 4.0–10.5)
nRBC: 0.1 % (ref 0.0–0.2)

## 2020-06-28 ENCOUNTER — Encounter: Payer: Self-pay | Admitting: Nurse Practitioner

## 2020-06-28 ENCOUNTER — Other Ambulatory Visit: Payer: Self-pay

## 2020-06-28 ENCOUNTER — Ambulatory Visit (INDEPENDENT_AMBULATORY_CARE_PROVIDER_SITE_OTHER): Payer: Medicare Other | Admitting: Nurse Practitioner

## 2020-06-28 VITALS — BP 103/59 | HR 70 | Temp 98.3°F | Ht 65.0 in | Wt 141.0 lb

## 2020-06-28 DIAGNOSIS — M5136 Other intervertebral disc degeneration, lumbar region: Secondary | ICD-10-CM | POA: Diagnosis not present

## 2020-06-28 DIAGNOSIS — E782 Mixed hyperlipidemia: Secondary | ICD-10-CM | POA: Diagnosis not present

## 2020-06-28 DIAGNOSIS — I10 Essential (primary) hypertension: Secondary | ICD-10-CM

## 2020-06-28 MED ORDER — TRAMADOL HCL 50 MG PO TABS: 50.0000 mg | ORAL_TABLET | Freq: Two times a day (BID) | ORAL | 0 refills | Status: DC

## 2020-06-28 NOTE — Progress Notes (Signed)
Established Patient Office Visit  Subjective:  Patient ID: [name], female    DOB: October 21, 1951  Age: 69 y.o. MRN: [id number]  CC:  Chief Complaint  Patient presents with  . Medication Refill    chronic med refills  . Back Pain    med refill  Chronic diseases management  HPI [name] presents for follow up of Hypertension. Patient was diagnosed in 01/08/2016. The patient is tolerating the medication well without side effects. Compliance with treatment has been good; including taking medication as directed, current medication Cardizem 40 mg daily, maintains a healthy diet, and following up as directed.   Mixed hyperlipidemia   Pt presents with hyperlipidemia. Patient was diagnosed in 04-27-2013. Compliance with treatment has been  good; The patient is compliant with medications, current medication Zetia 10 mg tablet daily, fenofibrate 145 mg tablet daily.  Maintains a low cholesterol diet , follows up as directed, The patient denies experiencing any hypercholesterolemia related symptoms.   Concerning patient's degenerative disc disease, patient reports pain is moderately controlled on current medication.  Patient is reporting pain of 5 out of 10 on the pain scale of 0-10.  She reports pain in her back, right knee, and shoulder blades.  Past Medical History:  Diagnosis Date  . Arthritis    "fingers, some in my knees" (01/28/2016)  . CAD (coronary artery disease), per cath 04/27/13, non obstructive disease, EF 65-70% 05/11/2013  . Carotid disease, bilateral ([place]) 09/2013   Lt carotid 50-69% stentosis, less on rt  . Dysrhythmia   . Fatty liver   . GERD (gastroesophageal reflux disease)   . Glaucoma   . H/O cardiovascular stress test 12/27/2009   negative for ischemia  . Headache    "occasional since eye pressure regulated" (01/28/2016)  . Hyperlipidemia   . Hypertension   . Leukocytosis 07/15/2015  . Migraine    "stopped w/laser holes to relieve pressure in my eyes; had them 3-4  times/wk before taht" (01/28/2016)  . Mitral regurgitation,2+ by cath 05/11/2013  . NSTEMI (non-ST elevated myocardial infarction) ([place]) 04/27/2013  . Pain    BOTH KNEES - PT HAS TORN MENISCUS LEFT KNEE  . Paroxysmal atrial fibrillation (HCC)    hx of a fib on ASA  AND ELIQUIS   . Polycythemia vera ([place]) 08/20/2015   JAK-2 positive 07/19/15  . PVD (peripheral vascular disease) with claudication ([place]) 07/2006   previous L ext iliac stent and rt SFA stent, known occluded Lt SFA  . S/P cardiac cath 04/27/13   NON OBSTRUCTIVE DISEASE, 2+ mr, ef 65-70%  . Statin intolerance   . Stroke ([place]) 2006   SWELLING ALL OVER AND RT SIDE OF FACE DRAWN AND SPEECH SLURRED AND NUMBNESS ON RIGHT SIDE-- ALL RESOLVED    Past Surgical History:  Procedure Laterality Date  . AMPUTATION Right 05/21/2017   Procedure: AMPUTATION ABOVE KNEE;  Surgeon: Rosetta Posner, MD;  Location: [facility];  Service: Vascular;  Laterality: Right;  . CARDIAC CATHETERIZATION  04/27/13   non occlusive disease with mild to mod. calcified lesions in ostial LM and proximal LAD and moderate prox. RC AND DISTAL AV GROOVE LCX, ef  . CATARACT EXTRACTION W/PHACO Right 03/21/2015   Procedure: CATARACT EXTRACTION PHACO AND INTRAOCULAR LENS PLACEMENT RIGHT EYE;  Surgeon: Baruch Goldmann, MD;  Location: [facility];  Service: Ophthalmology;  Laterality: Right;  CDE:5.10  . CATARACT EXTRACTION W/PHACO Left 05/16/2015   Procedure: CATARACT EXTRACTION PHACO AND INTRAOCULAR LENS PLACEMENT (IOC);  Surgeon: Jeneen Rinks  Marisa Hua, MD;  Location: [facility];  Service: Ophthalmology;  Laterality: Left;  CDE 5.57  . [place]  . COLONOSCOPY N/A 09/11/2016   Procedure: COLONOSCOPY;  Surgeon: Rogene Houston, MD;  [facility];  Service: Endoscopy;  Laterality: N/A;  730-moved to 1:00 Ann notified pt  . EMBOLECTOMY Right 02/01/2016   Procedure: Thrombectomy of Right Femoral-Popliteal Bypass Graft; Endarterectomy of Right Below Knee Popliteal Artery and Tibial Peroneal  Trunk with Bovine Pericardium Patch Angioplasty.;  Surgeon: Angelia Mould, MD;  Location: [facility];  Service: Vascular;  Laterality: Right;  . FEMORAL-POPLITEAL BYPASS GRAFT Right 01/31/2016   Procedure: BYPASS GRAFT RIGHT COMMON  FEMORALTO BELOW KNEE POPLITEAL ARTERY BYPASS GRAFT USING 6MM PROPATEN GORTEX GRAFT;  Surgeon: Mal Misty, MD;  Location: [facility];  Service: Vascular;  Laterality: Right;  . FEMORAL-POPLITEAL BYPASS GRAFT Right 01/09/2017   Procedure: THROMBECTOMY OF RIGHT FEMORAL-POPLITEAL  ARTERY BYPASS GRAFT; THROMBECTOMY RIGHT  TIBIAL VESSELS;  Surgeon: [name], MD;  Location: [facility];  Service: Vascular;  Laterality: Right;  . FEMORAL-POPLITEAL BYPASS GRAFT Right 05/17/2017   Procedure: RIGHT FEMORAL-TIBIAL PERONEAL TRUNK ARTERY BYPASS GRAFT USING 24mmX80cm PROPATEN GRAFT WITH REMOVABLE RING;  Surgeon: Rosetta Posner, MD;  Location: [facility];  Service: Vascular;  Laterality: Right;  . FRACTURE SURGERY    . ILIAC ARTERY STENT  07/2006   external iliac and Rt SFA stent 07/2006 AND THE LEFT WAS IN 2006  . KNEE ARTHROSCOPY WITH MEDIAL MENISECTOMY Left 03/27/2014   Procedure: left knee arthorscopy with medial chondraplasty of the medial femoral and patella, medial microfracture technique of medial femoral condyl;  Surgeon: Tobi Bastos, MD;  Location: [facility];  Service: Orthopedics;  Laterality: Left;  . LEFT HEART CATHETERIZATION WITH CORONARY ANGIOGRAM Right 04/27/2013   Procedure: LEFT HEART CATHETERIZATION WITH CORONARY ANGIOGRAM;  Surgeon: Leonie Man, MD;  Location: [facility] C[facility];  Service: Cardiovascular;  Laterality: Right;  . LOWER EXTREMITY ANGIOGRAM Right 01/31/2016   Procedure: INTRAOP RIGHT LOWER EXTREMITY ANGIOGRAM;  Surgeon: Mal Misty, MD;  Location: [facility];  Service: Vascular;  Laterality: Right;  . ORIF WRIST FRACTURE Right 1983  . OVARIAN CYST REMOVAL Right   . PATCH ANGIOPLASTY Right 01/31/2016   Procedure: RIGHT COMMON FEMORAL AND PROFUNDA FEMORIS ENDARECTOMY  WITH PATCH ANGIOPLASTY;  Surgeon: Mal Misty, MD;  Location: [facility];  Service: Vascular;  Laterality: Right;  . PATCH ANGIOPLASTY Right 01/09/2017   Procedure: PATCH ANGIOPLASTY RIGHT POPLITEAL ARTERY BYPASS GRAFT;  Surgeon: [name], MD;  Location: [facility];  Service: Vascular;  Laterality: Right;  . PERIPHERAL VASCULAR CATHETERIZATION N/A 06/27/2015   Procedure: Lower Extremity Angiography;  Surgeon: Lorretta Harp, MD;  Location: [facility];  Service: Cardiovascular;  Laterality: N/A;  . PERIPHERAL VASCULAR CATHETERIZATION Bilateral 01/29/2016   Procedure: Lower Extremity Angiography;  Surgeon: Wellington Hampshire, MD;  Location: [facility];  Service: Cardiovascular;  Laterality: Bilateral;  . PERIPHERAL VASCULAR CATHETERIZATION N/A 01/29/2016   Procedure: Abdominal Aortogram;  Surgeon: Wellington Hampshire, MD;  Location: [facility];  Service: Cardiovascular;  Laterality: N/A;  . REFRACTIVE SURGERY Bilateral    "6 in one eye; 7 in the other; to relieve pressure; not glaucoma" (01/28/2016)  . SFA Right 06/27/2015   overlapping Bahn covered stents  . TUBAL LIGATION  1983  . VEIN HARVEST Left 05/17/2017   Procedure: LEFT LEG GREATER SAPHENOUS VEIN HARVEST;  Surgeon: Rosetta Posner, MD;  [facility];  Service: Vascular;  Laterality: Left;  Marland Kitchen VEIN REPAIR Right 01/31/2016   Procedure: RIGHT GREATER SAPHENOUS VEIN EXAMNED BUT NOT REMOVED;  Surgeon: Mal Misty, MD;  Location: [facility];  Service: Vascular;  Laterality: Right;    Family History  Problem Relation Age of Onset  . CAD Mother   . Lung cancer Mother   . Heart attack Mother   . CAD Father   . Heart disease Father   . Heart attack Father 54       died in hes 33's with heart disease  . Healthy Sister   . Liver cancer Brother   . Healthy Sister   . CAD Brother        has had CABG  . CAD Brother     Social History   Socioeconomic History  . Marital status: Widowed    Spouse name: Not on file  . Number of  children: 2  . Years of education: 30  . Highest education level: Not on file  Occupational History  . Occupation: CNA  Tobacco Use  . Smoking status: Former Smoker    Packs/day: 1.00    Years: 45.00    Pack years: 45.00    Types: Cigarettes    Quit date: 11/11/2012    Years since quitting: 7.6  . Smokeless tobacco: Never Used  . Tobacco comment: quit 4 years ago  Vaping Use  . Vaping Use: Never used  Substance and Sexual Activity  . Alcohol use: No    Alcohol/week: 0.0 standard drinks  . Drug use: No  . Sexual activity: Not Currently    Birth control/protection: None  Other Topics Concern  . Not on file  Social History Narrative  . Not on file   Social Determinants of Health   Financial Resource Strain:   . Difficulty of Paying Living Expenses:   Food Insecurity:   . Worried About Charity fundraiser in the Last Year:   . Arboriculturist in the Last Year:   Transportation Needs:   . Film/video editor (Medical):   Marland Kitchen Lack of Transportation (Non-Medical):   Physical Activity:   . Days of Exercise per Week:   . Minutes of Exercise per Session:   Stress:   . Feeling of Stress :   Social Connections:   . Frequency of Communication with Friends and Family:   . Frequency of Social Gatherings with Friends and Family:   . Attends Religious Services:   . Active Member of Clubs or Organizations:   . Attends Archivist Meetings:   Marland Kitchen Marital Status:   Intimate Partner Violence:   . Fear of Current or Ex-Partner:   . Emotionally Abused:   Marland Kitchen Physically Abused:   . Sexually Abused:     Outpatient Medications Prior to Visit  Medication Sig Dispense Refill  . acetaminophen (TYLENOL) 500 MG tablet Take 2 tablets (1,000 mg total) by mouth every 6 (six) hours. 30 tablet 0  . alendronate (FOSAMAX) 70 MG tablet Take 1 tablet (70 mg total) by mouth once a week. 4 tablet 11  . apixaban (ELIQUIS) 5 MG TABS tablet Take 1 tablet (5 mg total) by mouth 2 (two) times  daily. 60 tablet 11  . ARTIFICIAL TEAR OP Place 1 drop into both eyes as needed (for dry eyes).    Marland Kitchen aspirin EC 325 MG tablet Take 325 mg by mouth daily.    . chlorthalidone (HYGROTON) 25 MG tablet  TAKE 1 TABLET BY MOUTH DAILY. 30 tablet 5  . diltiazem (CARDIZEM CD) 240 MG 24 hr capsule Take 1 capsule (240 mg total) by mouth every morning. 90 capsule 3  . docusate sodium (COLACE) 100 MG capsule Take 200 mg by mouth every evening.     . escitalopram (LEXAPRO) 10 MG tablet Take 1 tablet (10 mg total) by mouth daily. 30 tablet 5  . ezetimibe (ZETIA) 10 MG tablet Take 1 tablet (10 mg total) by mouth daily. 90 tablet 3  . fenofibrate (TRICOR) 145 MG tablet Take 1 tablet (145 mg total) by mouth at bedtime. PLEASE CONTACT OFFICE FOR ADDITIONAL REFILLS 30 tablet 11  . flecainide (TAMBOCOR) 50 MG tablet Take 1 tablet (50 mg total) by mouth 2 (two) times daily. PLEASE CONTACT OFFICE FOR ADDITIONAL REFILLS 60 tablet 11  . hydroxyurea (HYDREA) 500 MG capsule TAKE 1 CAPSULE DAILY. MAY TAKE WITH FOOD TO MINIMIZE GI SIDE EFFECTS 30 capsule 11  . omeprazole (PRILOSEC) 20 MG capsule Take 1 capsule (20 mg total) by mouth 2 (two) times daily before a meal. 60 capsule 11  . traMADol (ULTRAM) 50 MG tablet Take 1 tablet (50 mg total) by mouth every 8 (eight) hours as needed. 30 tablet 2  . valsartan (DIOVAN) 160 MG tablet Take 1 tablet (160 mg total) by mouth 2 (two) times daily. 180 tablet 1   No facility-administered medications prior to visit.    Allergies  Allergen Reactions  . Atenolol Rash and Itching  . Demerol  [Meperidine Hcl] Itching  . Demerol [Meperidine] Other (See Comments) and Itching    Unknown, can't remember  Unknown, can't remember   . Gabapentin Anxiety and Other (See Comments)    SI and HI  . Inderal [Propranolol] Other (See Comments) and Itching    Doesn't remember   . Prednisone Other (See Comments)    Muscle spasms  . Statins Itching and Other (See Comments)    Simvastatin-caused  severe itching Pravastatin-caused lesser tiching One type of statin? Caused stroke in 2006, and other symptoms as result  . Warfarin And Related Other (See Comments)    Caused nose bleeds  . Crestor [Rosuvastatin] Itching and Rash  . Docosahexaenoic Acid (Dha)     Other reaction(s): Other (See Comments) nosebleeds  . Lactose Intolerance (Gi) Other (See Comments)    Bloating, gas  . Lipitor [Atorvastatin] Rash  . Lovaza [Omega-3-Acid Ethyl Esters] Other (See Comments)    nosebleeds   . Penicillins Hives, Rash and Other (See Comments)    Has patient had a PCN reaction causing immediate rash, facial/tongue/throat swelling, SOB or lightheadedness with hypotension: Yes Has patient had a PCN reaction causing severe rash involving mucus membranes or skin necrosis: No Has patient had a PCN reaction that required hospitalization: No Has patient had a PCN reaction occurring within the last 10 years: No If all of the above answers are "NO", then may proceed with Cephalosporin use.  Questionable high fever   . Pravastatin Itching and Rash  . Simvastatin Itching, Rash and Other (See Comments)  . Trilipix [Choline Fenofibrate] Other (See Comments)    Doesn't remember   . Warfarin Other (See Comments)    nosebleeds  . Hydralazine Swelling  . Effexor [Venlafaxine Hcl] Other (See Comments)    Doesn't remember   . Nexium [Esomeprazole Magnesium] Rash  . Percocet [Oxycodone-Acetaminophen] Other (See Comments)    Doesn't remember   . Plavix [Clopidogrel Bisulfate] Rash    ROS Review of Systems  Constitutional: Negative.   HENT: Negative.   Eyes: Negative.   Respiratory: Negative.   Cardiovascular: Negative.   Gastrointestinal: Negative.   Genitourinary: Negative.   Musculoskeletal: Negative.   Skin: Positive for wound.  Neurological: Negative.   Psychiatric/Behavioral: Negative.       Objective:    Physical Exam Constitutional:      Appearance: Normal appearance.  HENT:      Head: Normocephalic.  Eyes:     Conjunctiva/sclera: Conjunctivae normal.  Cardiovascular:     Rate and Rhythm: Normal rate and regular rhythm.     Pulses: Normal pulses.     Heart sounds: Normal heart sounds.  Pulmonary:     Effort: Pulmonary effort is normal.     Breath sounds: Normal breath sounds.  Abdominal:     General: Bowel sounds are normal.  Musculoskeletal:        General: Tenderness present.     Cervical back: Neck supple.  Neurological:     Mental Status: She is alert and oriented to person, place, and time.     BP (!) 103/59   Pulse 70   Temp 98.3 F (36.8 C) (Temporal)   Ht 5\' 5"  (1.651 m)   Wt 141 lb (64 kg)   BMI 23.46 kg/m  Wt Readings from Last 3 Encounters:  05/30/20 149 lb 4.8 oz (67.7 kg)  04/25/20 138 lb (62.6 kg)  03/08/20 138 lb (62.6 kg)     Health Maintenance Due  Topic Date Due  . Hepatitis C Screening  Never done  . COVID-19 Vaccine (1) Never done  . MAMMOGRAM  Never done  . DEXA SCAN  Never done  . PNA vac Low Risk Adult (1 of 2 - PCV13) Never done    There are no preventive care reminders to display for this patient.  Lab Results  Component Value Date   TSH 2.113 06/25/2015   Lab Results  Component Value Date   WBC 20.5 (H) 06/27/2020   HGB 10.0 (L) 06/27/2020   HCT 34.9 (L) 06/27/2020   MCV 76.0 (L) 06/27/2020   PLT 549 (H) 06/27/2020   Lab Results  Component Value Date   NA 136 11/24/2019   K 4.8 11/24/2019   CO2 18 (L) 11/24/2019   GLUCOSE 89 11/24/2019   BUN 21 11/24/2019   CREATININE 0.93 11/24/2019   BILITOT 0.2 08/15/2019   ALKPHOS 62 08/15/2019   AST 18 08/15/2019   ALT 20 08/15/2019   PROT 6.8 08/15/2019   ALBUMIN 4.1 08/15/2019   CALCIUM 9.0 11/24/2019   ANIONGAP 10 09/12/2018   Lab Results  Component Value Date   CHOL 162 08/15/2019   Lab Results  Component Value Date   HDL 45 08/15/2019   Lab Results  Component Value Date   LDLCALC 73 08/15/2019   Lab Results  Component Value Date   TRIG  219 (H) 08/15/2019   Lab Results  Component Value Date   CHOLHDL 3.6 08/15/2019   Lab Results  Component Value Date   HGBA1C 5.7 (H) 01/28/2016      Assessment & Plan:   Problem List Items Addressed This Visit      Cardiovascular and Mediastinum   Essential hypertension    Patient is a 69 year old female who presents to clinic today for hypertension follow-up.  Patient was diagnosed in January 2017.  Patient is tolerating the medication well without side effects, compliance with treatment has been good including taking medication as directed.  Current medication Cardizem 40  mg daily, she maintains a healthy diet and following up as directed.  No changes to current medication management necessary.  Education provided to continue on a cholesterol-lowering diet.  Printed handouts given to patient.  Patient knows to follow-up as needed. Labs collected today: Lipid panel, results pending.      Relevant Orders   TSH     Musculoskeletal and Integument   DDD (degenerative disc disease), lumbar    Concerning patient's degenerative disc disease, moderately controlled chronic pain with current medication dose.  No changes necessary.  Patient will continue on current dose. Refill sent to pharmacy. Patient knows to follow-up with worsening symptoms.      Relevant Medications   traMADol (ULTRAM) 50 MG tablet     Other   Hyperlipidemia - Primary (Chronic)    Patient presents with hyperlipidemia, patient was diagnosed in 04/27/2013.  Compliance with treatment has been good.  Patient is compliant with medication current medication is a 2-year 10 mg tablet daily, fenofibrate 145 mg tablet daily.  Maintains a low cholesterol diet follows up as directed.  Patient denies experiencing any hypercholesterolemia related symptoms. Provided education to patient with printed handouts. Medication refill sent to pharmacy. Labs collected lipid panel.      Relevant Orders   Lipid panel      Meds  ordered this encounter  Medications  . traMADol (ULTRAM) 50 MG tablet    Sig: Take 1 tablet (50 mg total) by mouth 2 (two) times daily.    Dispense:  60 tablet    Refill:  0    Order Specific Question:   Supervising Provider    Answer:   Caryl Pina A [[id number]]    Follow-up: Return if symptoms worsen or fail to improve.    Ivy Lynn, NP

## 2020-06-28 NOTE — Assessment & Plan Note (Signed)
Concerning patient's degenerative disc disease, moderately controlled chronic pain with current medication dose.  No changes necessary.  Patient will continue on current dose. Refill sent to pharmacy. Patient knows to follow-up with worsening symptoms.

## 2020-06-28 NOTE — Assessment & Plan Note (Signed)
>>  ASSESSMENT AND PLAN FOR MIXED HYPERLIPIDEMIA WRITTEN ON 07/29/2020 10:39 AM BY IJAOLA, ONYEJE M, NP  Patient presents with hyperlipidemia, patient was diagnosed in 04/27/2013.  Compliance with treatment has been good.  Patient is compliant with medication current medication is Zetia  10 mg tablet daily, fenofibrate  145 mg tablet daily.  Maintains a low cholesterol diet follows up as directed.  Patient denies experiencing any hypercholesterolemia related symptoms. Provided education to patient with printed handouts. Medication refill sent to pharmacy. Labs collected lipid panel.

## 2020-06-28 NOTE — Patient Instructions (Addendum)
Essential hypertension Patient is a 69 year old female who presents to clinic today for hypertension follow-up.  Patient was diagnosed in January 2017.  Patient is tolerating the medication well without side effects, compliance with treatment has been good including taking medication as directed.  Current medication Cardizem 40 mg daily, she maintains a healthy diet and following up as directed.  No changes to current medication management necessary.  Education provided to continue on a cholesterol-lowering diet.  Printed handouts given to patient.  Patient knows to follow-up as needed. Labs collected today: Lipid panel, results pending.  Hyperlipidemia Patient presents with hyperlipidemia, patient was diagnosed in 04/27/2013.  Compliance with treatment has been good.  Patient is compliant with medication current medication is a 2-year 10 mg tablet daily, fenofibrate 145 mg tablet daily.  Maintains a low cholesterol diet follows up as directed.  Patient denies experiencing any hypercholesterolemia related symptoms. Provided education to patient with printed handouts. Medication refill sent to pharmacy. Labs collected lipid panel.  DDD (degenerative disc disease), lumbar Concerning patient's degenerative disc disease, moderately controlled chronic pain with current medication dose.  No changes necessary.  Patient will continue on current dose. Refill sent to pharmacy. Patient knows to follow-up with worsening symptoms.    Preventing High Cholesterol Cholesterol is a white, waxy substance similar to fat that the human body needs to help build cells. The liver makes all the cholesterol that a person's body needs. Having high cholesterol (hypercholesterolemia) increases a person's risk for heart disease and stroke. Extra (excess) cholesterol comes from the food the person eats. High cholesterol can often be prevented with diet and lifestyle changes. If you already have high cholesterol, you can control  it with diet and lifestyle changes and with medicine. How can high cholesterol affect me? If you have high cholesterol, deposits (plaques) may build up on the walls of your arteries. The arteries are the blood vessels that carry blood away from your heart. Plaques make the arteries narrower and stiffer. This can limit or block blood flow and cause blood clots to form. Blood clots:  Are tiny balls of cells that form in your blood.  Can move to the heart or brain, causing a heart attack or stroke. Plaques in arteries greatly increase your risk for heart attack and stroke.Making diet and lifestyle changes can reduce your risk for these conditions that may threaten your life. What can increase my risk? This condition is more likely to develop in people who:  Eat foods that are high in saturated fat or cholesterol. Saturated fat is mostly found in: ? Foods that contain animal fat, such as red meat and some dairy products. ? Certain fatty foods made from plants, such as tropical oils.  Are overweight.  Are not getting enough exercise.  Have a family history of high cholesterol. What actions can I take to prevent this? Nutrition   Eat less saturated fat.  Avoid trans fats (partially hydrogenated oils). These are often found in margarine and in some baked goods, fried foods, and snacks bought in packages.  Avoid precooked or cured meat, such as sausages or meat loaves.  Avoid foods and drinks that have added sugars.  Eat more fruits, vegetables, and whole grains.  Choose healthy sources of protein, such as fish, poultry, lean cuts of red meat, beans, peas, lentils, and nuts.  Choose healthy sources of fat, such as: ? Nuts. ? Vegetable oils, especially olive oil. ? Fish that have healthy fats (omega-3 fatty acids), such as mackerel or  salmon. The items listed above may not be a complete list of recommended foods and beverages. Contact a dietitian for more  information. Lifestyle  Lose weight if you are overweight. Losing 5-10 lb (2.3-4.5 kg) can help prevent or control high cholesterol. It can also lower your risk for diabetes and high blood pressure. Ask your health care provider to help you with a diet and exercise plan to lose weight safely.  Do not use any products that contain nicotine or tobacco, such as cigarettes, e-cigarettes, and chewing tobacco. If you need help quitting, ask your health care provider.  Limit your alcohol intake. ? Do not drink alcohol if:  Your health care provider tells you not to drink.  You are pregnant, may be pregnant, or are planning to become pregnant. ? If you drink alcohol:  Limit how much you use to:  0-1 drink a day for women.  0-2 drinks a day for men.  Be aware of how much alcohol is in your drink. In the [place] one drink equals one 12 oz bottle of beer (355 mL), one 5 oz glass of wine (148 mL), or one 1 oz glass of hard liquor (44 mL). Activity   Get enough exercise. Each week, do at least 150 minutes of exercise that takes a medium level of effort (moderate-intensity exercise). ? This is exercise that:  Makes your heart beat faster and makes you breathe harder than usual.  Allows you to still be able to talk. ? You could exercise in short sessions several times a day or longer sessions a few times a week. For example, on 5 days each week, you could walk fast or ride your bike 3 times a day for 10 minutes each time.  Do exercises as told by your health care provider. Medicines  In addition to diet and lifestyle changes, your health care provider may recommend medicines to help lower cholesterol. This may be a medicine to lower the amount of cholesterol your liver makes. You may need medicine if: ? Diet and lifestyle changes do not lower your cholesterol enough. ? You have high cholesterol and other risk factors for heart disease or stroke.  Take over-the-counter and prescription  medicines only as told by your health care provider. General information  Manage your risk factors for high cholesterol. Talk with your health care provider about all your risk factors and how to lower your risk.  Manage other conditions that you have, such as diabetes or high blood pressure (hypertension).  Have blood tests to check your cholesterol levels at regular points in time as told by your health care provider.  Keep all follow-up visits as told by your health care provider. This is important. Where to find more information  American Heart Association: www.heart.org  National Heart, Lung, and Blood Institute: https://wilson-eaton.com/ Summary  High cholesterol increases your risk for heart disease and stroke. By keeping your cholesterol level low, you can reduce your risk for these conditions.  High cholesterol can often be prevented with diet and lifestyle changes.  Work with your health care provider to manage your risk factors, and have your blood tested regularly. This information is not intended to replace advice given to you by your health care provider. Make sure you discuss any questions you have with your health care provider. Document Revised: 03/31/2019 Document Reviewed: 08/15/2016 Elsevier Patient Education  2020 [employer]. Hypertension, Adult Hypertension is another name for high blood pressure. High blood pressure forces your heart to work harder  to pump blood. This can cause problems over time. There are two numbers in a blood pressure reading. There is a top number (systolic) over a bottom number (diastolic). It is best to have a blood pressure that is below 120/80. Healthy choices can help lower your blood pressure, or you may need medicine to help lower it. What are the causes? The cause of this condition is not known. Some conditions may be related to high blood pressure. What increases the risk?  Smoking.  Having type 2 diabetes mellitus, high cholesterol,  or both.  Not getting enough exercise or physical activity.  Being overweight.  Having too much fat, sugar, calories, or salt (sodium) in your diet.  Drinking too much alcohol.  Having long-term (chronic) kidney disease.  Having a family history of high blood pressure.  Age. Risk increases with age.  Race. You may be at higher risk if you are African American.  Gender. Men are at higher risk than women before age 11. After age 83, women are at higher risk than men.  Having obstructive sleep apnea.  Stress. What are the signs or symptoms?  High blood pressure may not cause symptoms. Very high blood pressure (hypertensive crisis) may cause: ? Headache. ? Feelings of worry or nervousness (anxiety). ? Shortness of breath. ? Nosebleed. ? A feeling of being sick to your stomach (nausea). ? Throwing up (vomiting). ? Changes in how you see. ? Very bad chest pain. ? Seizures. How is this treated?  This condition is treated by making healthy lifestyle changes, such as: ? Eating healthy foods. ? Exercising more. ? Drinking less alcohol.  Your health care provider may prescribe medicine if lifestyle changes are not enough to get your blood pressure under control, and if: ? Your top number is above 130. ? Your bottom number is above 80.  Your personal target blood pressure may vary. Follow these instructions at home: Eating and drinking   If told, follow the DASH eating plan. To follow this plan: ? Fill one half of your plate at each meal with fruits and vegetables. ? Fill one fourth of your plate at each meal with whole grains. Whole grains include whole-wheat pasta, brown rice, and whole-grain bread. ? Eat or drink low-fat dairy products, such as skim milk or low-fat yogurt. ? Fill one fourth of your plate at each meal with low-fat (lean) proteins. Low-fat proteins include fish, chicken without skin, eggs, beans, and tofu. ? Avoid fatty meat, cured and processed meat, or  chicken with skin. ? Avoid pre-made or processed food.  Eat less than 1,500 mg of salt each day.  Do not drink alcohol if: ? Your doctor tells you not to drink. ? You are pregnant, may be pregnant, or are planning to become pregnant.  If you drink alcohol: ? Limit how much you use to:  0-1 drink a day for women.  0-2 drinks a day for men. ? Be aware of how much alcohol is in your drink. In the [place] one drink equals one 12 oz bottle of beer (355 mL), one 5 oz glass of wine (148 mL), or one 1 oz glass of hard liquor (44 mL). Lifestyle   Work with your doctor to stay at a healthy weight or to lose weight. Ask your doctor what the best weight is for you.  Get at least 30 minutes of exercise most days of the week. This may include walking, swimming, or biking.  Get at least 30 minutes of  exercise that strengthens your muscles (resistance exercise) at least 3 days a week. This may include lifting weights or doing Pilates.  Do not use any products that contain nicotine or tobacco, such as cigarettes, e-cigarettes, and chewing tobacco. If you need help quitting, ask your doctor.  Check your blood pressure at home as told by your doctor.  Keep all follow-up visits as told by your doctor. This is important. Medicines  Take over-the-counter and prescription medicines only as told by your doctor. Follow directions carefully.  Do not skip doses of blood pressure medicine. The medicine does not work as well if you skip doses. Skipping doses also puts you at risk for problems.  Ask your doctor about side effects or reactions to medicines that you should watch for. Contact a doctor if you:  Think you are having a reaction to the medicine you are taking.  Have headaches that keep coming back (recurring).  Feel dizzy.  Have swelling in your ankles.  Have trouble with your vision. Get help right away if you:  Get a very bad headache.  Start to feel mixed up (confused).  Feel weak  or numb.  Feel faint.  Have very bad pain in your: ? Chest. ? Belly (abdomen).  Throw up more than once.  Have trouble breathing. Summary  Hypertension is another name for high blood pressure.  High blood pressure forces your heart to work harder to pump blood.  For most people, a normal blood pressure is less than 120/80.  Making healthy choices can help lower blood pressure. If your blood pressure does not get lower with healthy choices, you may need to take medicine. This information is not intended to replace advice given to you by your health care provider. Make sure you discuss any questions you have with your health care provider. Document Revised: 08/17/2018 Document Reviewed: 08/17/2018 Elsevier Patient Education  2020 [employer].

## 2020-06-28 NOTE — Assessment & Plan Note (Addendum)
Patient is a 69 year old female who presents to clinic today for hypertension follow-up.  Patient was diagnosed in January 2017.  Patient is tolerating the medication well without side effects, compliance with treatment has been good including taking medication as directed.  Current medication Cardizem 40 mg daily, she maintains a healthy diet and following up as directed.  No changes to current medication management necessary.  Education provided to continue on a cholesterol-lowering diet.  Printed handouts given to patient.  Patient knows to follow-up as needed. Labs collected today: Lipid panel, results pending.

## 2020-06-28 NOTE — Assessment & Plan Note (Addendum)
Patient presents with hyperlipidemia, patient was diagnosed in 04/27/2013.  Compliance with treatment has been good.  Patient is compliant with medication current medication is Zetia 10 mg tablet daily, fenofibrate 145 mg tablet daily.  Maintains a low cholesterol diet follows up as directed.  Patient denies experiencing any hypercholesterolemia related symptoms. Provided education to patient with printed handouts. Medication refill sent to pharmacy. Labs collected lipid panel.

## 2020-06-29 LAB — LIPID PANEL
Chol/HDL Ratio: 3.9 ratio (ref 0.0–4.4)
Cholesterol, Total: 162 mg/dL (ref 100–199)
HDL: 42 mg/dL (ref >39)
LDL Chol Calc (NIH): 88 mg/dL (ref 0–99)
Triglycerides: 189 mg/dL — ABNORMAL HIGH (ref 0–149)
VLDL Cholesterol Cal: 32 mg/dL (ref 5–40)

## 2020-06-29 LAB — TSH: TSH: 1.55 u[IU]/mL (ref 0.450–4.500)

## 2020-07-01 DIAGNOSIS — S81802A Unspecified open wound, left lower leg, initial encounter: Secondary | ICD-10-CM | POA: Diagnosis not present

## 2020-07-02 ENCOUNTER — Telehealth: Payer: Self-pay

## 2020-07-02 ENCOUNTER — Other Ambulatory Visit: Payer: Self-pay

## 2020-07-02 NOTE — Telephone Encounter (Signed)
Called Mrs. Hyun to let her know her Hemoglobin decrease and platelet increase since discontinuation of Hydrea.  Advised her to take OTC Ferrous Sulfate 1 tab daily.  She will do that starting today.  Her next appointment was August 5 but per Dr. Ernestina Penna message, she wants her to come in earlier.  I moved her appointments to July 26 and patient is able to come that day at 1 pm for labs and 140 pm for office visit with Dr. Burr Medico.  Appointments moved.  No other questions.

## 2020-07-10 ENCOUNTER — Other Ambulatory Visit: Payer: Self-pay

## 2020-07-10 ENCOUNTER — Encounter (HOSPITAL_BASED_OUTPATIENT_CLINIC_OR_DEPARTMENT_OTHER): Payer: Medicare Other | Admitting: Physician Assistant

## 2020-07-10 DIAGNOSIS — L97822 Non-pressure chronic ulcer of other part of left lower leg with fat layer exposed: Secondary | ICD-10-CM | POA: Diagnosis not present

## 2020-07-10 DIAGNOSIS — Z7901 Long term (current) use of anticoagulants: Secondary | ICD-10-CM | POA: Diagnosis not present

## 2020-07-10 DIAGNOSIS — D45 Polycythemia vera: Secondary | ICD-10-CM | POA: Diagnosis not present

## 2020-07-10 DIAGNOSIS — Z8673 Personal history of transient ischemic attack (TIA), and cerebral infarction without residual deficits: Secondary | ICD-10-CM | POA: Diagnosis not present

## 2020-07-10 DIAGNOSIS — Z89611 Acquired absence of right leg above knee: Secondary | ICD-10-CM | POA: Diagnosis not present

## 2020-07-10 DIAGNOSIS — I480 Paroxysmal atrial fibrillation: Secondary | ICD-10-CM | POA: Diagnosis not present

## 2020-07-10 DIAGNOSIS — Z86718 Personal history of other venous thrombosis and embolism: Secondary | ICD-10-CM | POA: Diagnosis not present

## 2020-07-10 DIAGNOSIS — M109 Gout, unspecified: Secondary | ICD-10-CM | POA: Diagnosis not present

## 2020-07-10 DIAGNOSIS — I872 Venous insufficiency (chronic) (peripheral): Secondary | ICD-10-CM | POA: Diagnosis not present

## 2020-07-10 DIAGNOSIS — H409 Unspecified glaucoma: Secondary | ICD-10-CM | POA: Diagnosis not present

## 2020-07-10 DIAGNOSIS — I10 Essential (primary) hypertension: Secondary | ICD-10-CM | POA: Diagnosis not present

## 2020-07-10 DIAGNOSIS — I739 Peripheral vascular disease, unspecified: Secondary | ICD-10-CM | POA: Diagnosis not present

## 2020-07-10 DIAGNOSIS — Z7689 Persons encountering health services in other specified circumstances: Secondary | ICD-10-CM | POA: Diagnosis not present

## 2020-07-10 DIAGNOSIS — Z89511 Acquired absence of right leg below knee: Secondary | ICD-10-CM | POA: Diagnosis not present

## 2020-07-10 DIAGNOSIS — M1990 Unspecified osteoarthritis, unspecified site: Secondary | ICD-10-CM | POA: Diagnosis not present
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
## 2020-07-15 ENCOUNTER — Inpatient Hospital Stay (HOSPITAL_BASED_OUTPATIENT_CLINIC_OR_DEPARTMENT_OTHER): Payer: Medicare Other | Admitting: Hematology

## 2020-07-15 ENCOUNTER — Inpatient Hospital Stay: Payer: Medicare Other

## 2020-07-15 ENCOUNTER — Encounter: Payer: Self-pay | Admitting: Hematology

## 2020-07-15 ENCOUNTER — Other Ambulatory Visit: Payer: Self-pay

## 2020-07-15 VITALS — BP 156/65 | HR 60 | Temp 97.9°F | Resp 17 | Ht 65.0 in | Wt 145.0 lb

## 2020-07-15 DIAGNOSIS — D500 Iron deficiency anemia secondary to blood loss (chronic): Secondary | ICD-10-CM

## 2020-07-15 DIAGNOSIS — I480 Paroxysmal atrial fibrillation: Secondary | ICD-10-CM | POA: Diagnosis not present

## 2020-07-15 DIAGNOSIS — Z86718 Personal history of other venous thrombosis and embolism: Secondary | ICD-10-CM | POA: Diagnosis not present

## 2020-07-15 DIAGNOSIS — D509 Iron deficiency anemia, unspecified: Secondary | ICD-10-CM | POA: Diagnosis not present

## 2020-07-15 DIAGNOSIS — I10 Essential (primary) hypertension: Secondary | ICD-10-CM | POA: Diagnosis not present

## 2020-07-15 DIAGNOSIS — E785 Hyperlipidemia, unspecified: Secondary | ICD-10-CM | POA: Diagnosis not present

## 2020-07-15 DIAGNOSIS — Z8673 Personal history of transient ischemic attack (TIA), and cerebral infarction without residual deficits: Secondary | ICD-10-CM | POA: Diagnosis not present

## 2020-07-15 DIAGNOSIS — Z79899 Other long term (current) drug therapy: Secondary | ICD-10-CM | POA: Diagnosis not present

## 2020-07-15 DIAGNOSIS — Z7901 Long term (current) use of anticoagulants: Secondary | ICD-10-CM | POA: Diagnosis not present

## 2020-07-15 DIAGNOSIS — R7989 Other specified abnormal findings of blood chemistry: Secondary | ICD-10-CM | POA: Diagnosis not present

## 2020-07-15 DIAGNOSIS — Z7952 Long term (current) use of systemic steroids: Secondary | ICD-10-CM | POA: Diagnosis not present

## 2020-07-15 DIAGNOSIS — I252 Old myocardial infarction: Secondary | ICD-10-CM | POA: Diagnosis not present

## 2020-07-15 DIAGNOSIS — D45 Polycythemia vera: Secondary | ICD-10-CM | POA: Diagnosis not present

## 2020-07-15 DIAGNOSIS — Z7689 Persons encountering health services in other specified circumstances: Secondary | ICD-10-CM | POA: Diagnosis not present

## 2020-07-15 DIAGNOSIS — Z7982 Long term (current) use of aspirin: Secondary | ICD-10-CM | POA: Diagnosis not present

## 2020-07-15 DIAGNOSIS — Z89611 Acquired absence of right leg above knee: Secondary | ICD-10-CM | POA: Diagnosis not present

## 2020-07-15 LAB — CBC WITH DIFFERENTIAL (CANCER CENTER ONLY)
Abs Immature Granulocytes: 0.24 10*3/uL — ABNORMAL HIGH (ref 0.00–0.07)
Basophils Absolute: 0.2 10*3/uL — ABNORMAL HIGH (ref 0.0–0.1)
Basophils Relative: 1 %
Eosinophils Absolute: 0.5 10*3/uL (ref 0.0–0.5)
Eosinophils Relative: 3 %
HCT: 42.3 % (ref 36.0–46.0)
Hemoglobin: 12.1 g/dL (ref 12.0–15.0)
Immature Granulocytes: 1 %
Lymphocytes Relative: 8 %
Lymphs Abs: 1.3 10*3/uL (ref 0.7–4.0)
MCH: 22.8 pg — ABNORMAL LOW (ref 26.0–34.0)
MCHC: 28.6 g/dL — ABNORMAL LOW (ref 30.0–36.0)
MCV: 79.7 fL — ABNORMAL LOW (ref 80.0–100.0)
Monocytes Absolute: 0.4 10*3/uL (ref 0.1–1.0)
Monocytes Relative: 2 %
Neutro Abs: 14.9 10*3/uL — ABNORMAL HIGH (ref 1.7–7.7)
Neutrophils Relative %: 85 %
Platelet Count: 609 10*3/uL — ABNORMAL HIGH (ref 150–400)
RBC: 5.31 MIL/uL — ABNORMAL HIGH (ref 3.87–5.11)
RDW: 21.8 % — ABNORMAL HIGH (ref 11.5–15.5)
WBC Count: 17.5 10*3/uL — ABNORMAL HIGH (ref 4.0–10.5)
nRBC: 0 % (ref 0.0–0.2)

## 2020-07-16 ENCOUNTER — Telehealth: Payer: Self-pay | Admitting: Hematology

## 2020-07-16 NOTE — Telephone Encounter (Signed)
Scheduled per 7/26 los. Pt is aware of appt times and dates.

## 2020-07-17 ENCOUNTER — Other Ambulatory Visit: Payer: Self-pay | Admitting: *Deleted

## 2020-07-17 DIAGNOSIS — M810 Age-related osteoporosis without current pathological fracture: Secondary | ICD-10-CM

## 2020-07-17 MED ORDER — ALENDRONATE SODIUM 70 MG PO TABS: 70.0000 mg | ORAL_TABLET | ORAL | 0 refills | Status: DC

## 2020-07-17 MED ORDER — FENOFIBRATE 145 MG PO TABS: 145.0000 mg | ORAL_TABLET | Freq: Every day | ORAL | 0 refills | Status: DC

## 2020-07-17 MED ORDER — OMEPRAZOLE 20 MG PO CPDR: 20.0000 mg | DELAYED_RELEASE_CAPSULE | Freq: Two times a day (BID) | ORAL | 0 refills | Status: DC

## 2020-07-17 NOTE — Addendum Note (Signed)
Addended by: Antonietta Barcelona D on: 07/17/2020 10:20 AM   Modules accepted: Orders

## 2020-07-19 ENCOUNTER — Other Ambulatory Visit: Payer: Self-pay | Admitting: *Deleted

## 2020-07-19 MED ORDER — APIXABAN 5 MG PO TABS: 5.0000 mg | ORAL_TABLET | Freq: Two times a day (BID) | ORAL | 0 refills | Status: DC

## 2020-07-24 ENCOUNTER — Encounter (HOSPITAL_BASED_OUTPATIENT_CLINIC_OR_DEPARTMENT_OTHER): Payer: Medicare Other | Attending: Physician Assistant | Admitting: Physician Assistant

## 2020-07-24 DIAGNOSIS — I872 Venous insufficiency (chronic) (peripheral): Secondary | ICD-10-CM | POA: Insufficient documentation

## 2020-07-24 DIAGNOSIS — Z7901 Long term (current) use of anticoagulants: Secondary | ICD-10-CM | POA: Diagnosis not present

## 2020-07-24 DIAGNOSIS — L97822 Non-pressure chronic ulcer of other part of left lower leg with fat layer exposed: Secondary | ICD-10-CM | POA: Insufficient documentation

## 2020-07-24 DIAGNOSIS — Z89611 Acquired absence of right leg above knee: Secondary | ICD-10-CM | POA: Insufficient documentation

## 2020-07-24 DIAGNOSIS — Z7689 Persons encountering health services in other specified circumstances: Secondary | ICD-10-CM | POA: Diagnosis not present

## 2020-07-24 DIAGNOSIS — S81802A Unspecified open wound, left lower leg, initial encounter: Secondary | ICD-10-CM | POA: Diagnosis not present

## 2020-07-24 DIAGNOSIS — Z95820 Peripheral vascular angioplasty status with implants and grafts: Secondary | ICD-10-CM | POA: Diagnosis not present

## 2020-07-24 DIAGNOSIS — I480 Paroxysmal atrial fibrillation: Secondary | ICD-10-CM | POA: Insufficient documentation

## 2020-07-24 DIAGNOSIS — I7389 Other specified peripheral vascular diseases: Secondary | ICD-10-CM | POA: Diagnosis not present
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
## 2020-07-25 ENCOUNTER — Other Ambulatory Visit: Payer: Medicare Other

## 2020-07-25 ENCOUNTER — Ambulatory Visit: Payer: Medicare Other | Admitting: Hematology

## 2020-07-25 NOTE — Progress Notes (Signed)
SUMAN, TRIVEDI ([id number]) Visit Report for 07/24/2020 Arrival Information Details Patient Name: Date of Service: [place], [name] 07/24/2020 9:00 A M Medical Record Number: [id number] Patient Account Number: [id number] Date of Birth/Sex: Treating RN: [birth year] (69 y.o. [name] Primary Care Dierks Wach: Jori Moll, Karsten Fells Other Clinician: Referring Aaban Griep: Treating Lamyia Cdebaca/Extender: Oliver Pila, BRITNEY Weeks in Treatment: 8 Visit Information History Since Last Visit Added or deleted any medications: No Patient Arrived: Walker Any new allergies or adverse reactions: No Arrival Time: 08:47 Had a fall or experienced change in No Accompanied By: self activities of daily living that may affect Transfer Assistance: None risk of falls: Patient Identification Verified: Yes Signs or symptoms of abuse/neglect since last visito No Secondary Verification Process Completed: Yes Hospitalized since last visit: No Patient Requires Transmission-Based Precautions: No Implantable device outside of the clinic excluding No Patient Has Alerts: Yes cellular tissue based products placed in the center Patient Alerts: Patient on Blood Thinner since last visit: L ABI non compressible Has Dressing in Place as Prescribed: Yes Pain Present Now: No Electronic Signature(s) Signed: 07/24/2020 10:37:40 AM By: Sandre Kitty Entered By: Sandre Kitty on 07/24/2020 08:48:23 -------------------------------------------------------------------------------- Clinic Level of Care Assessment Details Patient Name: Date of Service: MO [place], [name] 07/24/2020 9:00 A M Medical Record Number: [id number] Patient Account Number: [id number] Date of Birth/Sex: Treating RN: Sep 05, 1951 (69 y.o. [name] Primary Care Maygan Koeller: Jori Moll, Karsten Fells Other Clinician: Referring Kikue Gerhart: Treating Rakiya Krawczyk/Extender: Oliver Pila, BRITNEY Weeks in Treatment: 8 Clinic Level of Care Assessment  Items TOOL 4 Quantity Score []  - 0 Use when only an EandM is performed on FOLLOW-UP visit ASSESSMENTS - Nursing Assessment / Reassessment X- 1 10 Reassessment of Co-morbidities (includes updates in patient status) X- 1 5 Reassessment of Adherence to Treatment Plan ASSESSMENTS - Wound and Skin A ssessment / Reassessment []  - 0 Simple Wound Assessment / Reassessment - one wound X- 3 5 Complex Wound Assessment / Reassessment - multiple wounds []  - 0 Dermatologic / Skin Assessment (not related to wound area) ASSESSMENTS - Focused Assessment X- 1 5 Circumferential Edema Measurements - multi extremities []  - 0 Nutritional Assessment / Counseling / Intervention X- 1 5 Lower Extremity Assessment (monofilament, tuning fork, pulses) []  - 0 Peripheral Arterial Disease Assessment (using hand held doppler) ASSESSMENTS - Ostomy and/or Continence Assessment and Care []  - 0 Incontinence Assessment and Management []  - 0 Ostomy Care Assessment and Management (repouching, etc.) PROCESS - Coordination of Care X - Simple Patient / Family Education for ongoing care 1 15 []  - 0 Complex (extensive) Patient / Family Education for ongoing care X- 1 10 Staff obtains Programmer, systems, Records, T Results / Process Orders est []  - 0 Staff telephones HHA, Nursing Homes / Clarify orders / etc []  - 0 Routine Transfer to another Facility (non-emergent condition) []  - 0 Routine Hospital Admission (non-emergent condition) []  - 0 New Admissions / Biomedical engineer / Ordering NPWT Apligraf, etc. , []  - 0 Emergency Hospital Admission (emergent condition) X- 1 10 Simple Discharge Coordination []  - 0 Complex (extensive) Discharge Coordination PROCESS - Special Needs []  - 0 Pediatric / Minor Patient Management []  - 0 Isolation Patient Management []  - 0 Hearing / Language / Visual special needs []  - 0 Assessment of Community assistance (transportation, D/C planning, etc.) []  - 0 Additional  assistance / Altered mentation []  - 0 Support Surface(s) Assessment (bed, cushion, seat, etc.) INTERVENTIONS - Wound Cleansing / Measurement []  -  0 Simple Wound Cleansing - one wound X- 3 5 Complex Wound Cleansing - multiple wounds X- 1 5 Wound Imaging (photographs - any number of wounds) []  - 0 Wound Tracing (instead of photographs) []  - 0 Simple Wound Measurement - one wound X- 3 5 Complex Wound Measurement - multiple wounds INTERVENTIONS - Wound Dressings X - Small Wound Dressing one or multiple wounds 3 10 []  - 0 Medium Wound Dressing one or multiple wounds []  - 0 Large Wound Dressing one or multiple wounds X- 1 5 Application of Medications - topical []  - 0 Application of Medications - injection INTERVENTIONS - Miscellaneous []  - 0 External ear exam []  - 0 Specimen Collection (cultures, biopsies, blood, body fluids, etc.) []  - 0 Specimen(s) / Culture(s) sent or taken to Lab for analysis []  - 0 Patient Transfer (multiple staff / Civil Service fast streamer / Similar devices) []  - 0 Simple Staple / Suture removal (25 or less) []  - 0 Complex Staple / Suture removal (26 or more) []  - 0 Hypo / Hyperglycemic Management (close monitor of Blood Glucose) []  - 0 Ankle / Brachial Index (ABI) - do not check if billed separately X- 1 5 Vital Signs Has the patient been seen at the hospital within the last three years: Yes Total Score: 150 Level Of Care: New/Established - Level 4 Electronic Signature(s) Signed: 07/24/2020 4:54:27 PM By: Baruch Gouty RN, [name] Entered By: Baruch Gouty on 07/24/2020 09:09:41 -------------------------------------------------------------------------------- Encounter Discharge Information Details Patient Name: Date of Service: [name], [name] 07/24/2020 9:00 A M Medical Record Number: [id number] Patient Account Number: [id number] Date of Birth/Sex: Treating RN: 1951/07/18 (69 y.o. [name] Primary Care Elizabeth Haff: Jori Moll, Karsten Fells Other  Clinician: Referring Amyla Heffner: Treating Brent Noto/Extender: Baron Hamper Weeks in Treatment: 8 Encounter Discharge Information Items Discharge Condition: Stable Ambulatory Status: Walker Discharge Destination: Home Transportation: Private Auto Accompanied By: self Schedule Follow-up Appointment: Yes Clinical Summary of Care: Patient Declined Electronic Signature(s) Signed: 07/24/2020 4:35:51 PM By: Carlene Coria RN Entered By: Carlene Coria on 07/24/2020 09:27:21 -------------------------------------------------------------------------------- Lower Extremity Assessment Details Patient Name: Date of Service: [name], [name] 07/24/2020 9:00 A M Medical Record Number: [id number] Patient Account Number: [id number] Date of Birth/Sex: Treating RN: 01-17-1951 (69 y.o. Nancy Fetter Primary Care Kymberlyn Eckford: Jori Moll, Karsten Fells Other Clinician: Referring Anavi Branscum: Treating Samauri Kellenberger/Extender: Oliver Pila, BRITNEY Weeks in Treatment: 8 Edema Assessment Assessed: [Left: No] [Right: No] Edema: [Left: Ye] [Right: s] Calf Left: Right: Point of Measurement: 28 cm From Medial Instep 31.5 cm cm Ankle Left: Right: Point of Measurement: 9 cm From Medial Instep 18 cm cm Vascular Assessment Pulses: Dorsalis Pedis Palpable: [Left:No] Electronic Signature(s) Signed: 07/25/2020 5:21:32 PM By: [name], [name] Entered By: Levan Hurst on 07/24/2020 08:58:30 -------------------------------------------------------------------------------- Multi-Disciplinary Care Plan Details Patient Name: Date of Service: [name], [name] 07/24/2020 9:00 A M Medical Record Number: [id number] Patient Account Number: [id number] Date of Birth/Sex: Treating RN: [birth year] (69 y.o. [name] Primary Care Berkeley Vanaken: Jori Moll, Karsten Fells Other Clinician: Referring Vivienne Sangiovanni: Treating Cayde Held/Extender: Oliver Pila, BRITNEY Weeks in Treatment: 8 Active Inactive Wound/Skin  Impairment Nursing Diagnoses: Knowledge deficit related to ulceration/compromised skin integrity Goals: Patient/caregiver will verbalize understanding of skin care regimen Date Initiated: 05/29/2020 Target Resolution Date: 08/21/2020 Goal Status: Active Interventions: Assess patient/caregiver ability to perform ulcer/skin care regimen upon admission and as needed Assess ulceration(s) every visit Provide education on ulcer and skin care Treatment Activities: Skin care  regimen initiated : 05/29/2020 Topical wound management initiated : 05/29/2020 Notes: Electronic Signature(s) Signed: 07/24/2020 4:54:27 PM By: Baruch Gouty RN, [name] Entered By: Baruch Gouty on 07/24/2020 09:05:16 -------------------------------------------------------------------------------- Pain Assessment Details Patient Name: Date of Service: [name] 07/24/2020 9:00 A M Medical Record Number: [id number] Patient Account Number: [id number] Date of Birth/Sex: Treating RN: 08-28-1951 (69 y.o. [name] Primary Care Tiasha Helvie: Jori Moll, Karsten Fells Other Clinician: Referring Burwell Bethel: Treating Makara Lanzo/Extender: Oliver Pila, BRITNEY Weeks in Treatment: 8 Active Problems Location of Pain Severity and Description of Pain Patient Has Paino No Site Locations Pain Management and Medication Current Pain Management: Electronic Signature(s) Signed: 07/24/2020 10:37:40 AM By: Sandre Kitty Signed: 07/24/2020 4:54:27 PM By: Baruch Gouty RN, [name] Entered By: Sandre Kitty on 07/24/2020 08:48:42 -------------------------------------------------------------------------------- Patient/Caregiver Education Details Patient Name: Date of Service: [name], [name] 8/4/2021andnbsp9:00 A M Medical Record Number: [id number] Patient Account Number: [id number] Date of Birth/Gender: Treating RN: 1951/07/28 (69 y.o. [name] Primary Care Physician: Jori Moll, Karsten Fells Other Clinician: Referring  Physician: Treating Physician/Extender: Baron Hamper Weeks in Treatment: 8 Education Assessment Education Provided To: Patient Education Topics Provided Pressure: Methods: Explain/Verbal Responses: Reinforcements needed, State content correctly Wound/Skin Impairment: Methods: Explain/Verbal Responses: Reinforcements needed, State content correctly Electronic Signature(s) Signed: 07/24/2020 4:54:27 PM By: Baruch Gouty RN, [name] Entered By: Baruch Gouty on 07/24/2020 09:05:39 -------------------------------------------------------------------------------- Wound Assessment Details Patient Name: Date of Service: [name] 07/24/2020 9:00 A M Medical Record Number: [id number] Patient Account Number: [id number] Date of Birth/Sex: Treating RN: 01/01/1951 (69 y.o. Martyn Malay, [name] Primary Care Jessieca Rhem: Jori Moll, Karsten Fells Other Clinician: Referring Chirstine Defrain: Treating Makhi Muzquiz/Extender: Oliver Pila, BRITNEY Weeks in Treatment: 8 Wound Status Wound Number: 10 Primary Arterial Insufficiency Ulcer Etiology: Wound Location: Left, Lateral Lower Leg Wound Open Wounding Event: Gradually Appeared Status: Date Acquired: 07/24/2020 Comorbid Glaucoma, Arrhythmia, Hypertension, Peripheral Arterial Disease, Weeks Of Treatment: 0 History: Peripheral Venous Disease, Gout, Osteoarthritis Clustered Wound: No Photos Photo Uploaded By: Mikeal Hawthorne on 07/25/2020 13:03:31 Wound Measurements Length: (cm) 0.8 Width: (cm) 0.4 Depth: (cm) 0.1 Area: (cm) 0.251 Volume: (cm) 0.025 % Reduction in Area: 0% % Reduction in Volume: 0% Epithelialization: None Tunneling: No Undermining: No Wound Description Classification: Full Thickness Without Exposed Support Structures Wound Margin: Flat and Intact Exudate Amount: Medium Exudate Type: Serosanguineous Exudate Color: red, brown Foul Odor After Cleansing: No Slough/Fibrino Yes Wound Bed Granulation Amount:  Small (1-33%) Exposed Structure Granulation Quality: Pink Fascia Exposed: No Necrotic Amount: Large (67-100%) Fat Layer (Subcutaneous Tissue) Exposed: Yes Necrotic Quality: Adherent Slough Tendon Exposed: No Muscle Exposed: No Joint Exposed: No Bone Exposed: No Treatment Notes Wound #10 (Left, Lateral Lower Leg) 1. Cleanse With Wound Cleanser 3. Primary Dressing Applied Calcium Alginate Ag 4. Secondary Dressing Dry Gauze Roll Gauze 5. Secured With Recruitment consultant) Signed: 07/24/2020 4:54:27 PM By: Baruch Gouty RN, [name] Signed: 07/25/2020 5:21:32 PM By: [name], [name] Entered By: Levan Hurst on 07/24/2020 08:59:29 -------------------------------------------------------------------------------- Wound Assessment Details Patient Name: Date of Service: [name], [name] 07/24/2020 9:00 A M Medical Record Number: [id number] Patient Account Number: [id number] Date of Birth/Sex: Treating RN: September 13, 1951 (69 y.o. [name] Primary Care Melana Hingle: Jori Moll, Karsten Fells Other Clinician: Referring Cheyne Boulden: Treating Kemontae Dunklee/Extender: Oliver Pila, BRITNEY Weeks in Treatment: 8 Wound Status Wound Number: 8 Primary Venous Leg Ulcer Etiology: Wound Location: Left, Medial Lower Leg Wound Open Wounding Event: Blister Status: Date  Acquired: 04/11/2020 Comorbid Glaucoma, Arrhythmia, Hypertension, Peripheral Arterial Disease, Weeks Of Treatment: 8 History: Peripheral Venous Disease, Gout, Osteoarthritis Clustered Wound: Yes Photos Photo Uploaded By: Mikeal Hawthorne on 07/25/2020 13:03:11 Wound Measurements Length: (cm) 0.4 Width: (cm) 0.4 Depth: (cm) 0.1 Clustered Quantity: 1 Area: (cm) 0. Volume: (cm) 0. % Reduction in Area: 99.8% % Reduction in Volume: 99.8% Epithelialization: Medium (34-66%) Tunneling: No 126 Undermining: No 013 Wound Description Classification: Full Thickness Without Exposed Support Structures Wound Margin: Flat and  Intact Exudate Amount: Medium Exudate Type: Serous Exudate Color: amber Foul Odor After Cleansing: No Slough/Fibrino Yes Wound Bed Granulation Amount: Small (1-33%) Exposed Structure Granulation Quality: Pink Fascia Exposed: No Necrotic Amount: Large (67-100%) Fat Layer (Subcutaneous Tissue) Exposed: Yes Necrotic Quality: Adherent Slough Tendon Exposed: No Muscle Exposed: No Joint Exposed: No Bone Exposed: No Treatment Notes Wound #8 (Left, Medial Lower Leg) 1. Cleanse With Wound Cleanser 3. Primary Dressing Applied Calcium Alginate Ag 4. Secondary Dressing Dry Gauze Roll Gauze 5. Secured With Recruitment consultant) Signed: 07/24/2020 4:54:27 PM By: Baruch Gouty RN, [name] Signed: 07/25/2020 5:21:32 PM By: [name], [name] Entered By: Levan Hurst on 07/24/2020 09:00:05 -------------------------------------------------------------------------------- Wound Assessment Details Patient Name: Date of Service: [name], [name] 07/24/2020 9:00 A M Medical Record Number: [id number] Patient Account Number: [id number] Date of Birth/Sex: Treating RN: 09-Aug-1951 (69 y.o. Martyn Malay, [name] Primary Care Trigg Delarocha: Jori Moll, Karsten Fells Other Clinician: Referring Gioia Ranes: Treating Lakevia Perris/Extender: Oliver Pila, BRITNEY Weeks in Treatment: 8 Wound Status Wound Number: 9 Primary Venous Leg Ulcer Etiology: Wound Location: Left, Posterior Lower Leg Wound Open Wounding Event: Gradually Appeared Status: Date Acquired: 06/05/2020 Comorbid Glaucoma, Arrhythmia, Hypertension, Peripheral Arterial Disease, Weeks Of Treatment: 7 History: Peripheral Venous Disease, Gout, Osteoarthritis Clustered Wound: No Photos Photo Uploaded By: Mikeal Hawthorne on 07/25/2020 13:03:43 Wound Measurements Length: (cm) 0.3 Width: (cm) 0.6 Depth: (cm) 0.1 Clustered Quantity: 4 Area: (cm) 0.141 Volume: (cm) 0.014 % Reduction in Area: 98.2% % Reduction in Volume:  98.2% Epithelialization: Large (67-100%) Tunneling: No Undermining: No Wound Description Classification: Full Thickness Without Exposed Support Structures Wound Margin: Distinct, outline attached Exudate Amount: Medium Exudate Type: Serosanguineous Exudate Color: red, brown Foul Odor After Cleansing: No Slough/Fibrino Yes Wound Bed Granulation Amount: Small (1-33%) Exposed Structure Granulation Quality: Pink, Pale Fascia Exposed: No Necrotic Amount: Large (67-100%) Fat Layer (Subcutaneous Tissue) Exposed: Yes Necrotic Quality: Adherent Slough Tendon Exposed: No Muscle Exposed: No Joint Exposed: No Bone Exposed: No Treatment Notes Wound #9 (Left, Posterior Lower Leg) 1. Cleanse With Wound Cleanser 3. Primary Dressing Applied Calcium Alginate Ag 4. Secondary Dressing Dry Gauze Roll Gauze 5. Secured With Recruitment consultant) Signed: 07/24/2020 4:54:27 PM By: Baruch Gouty RN, [name] Signed: 07/25/2020 5:21:32 PM By: [name], [name] Entered By: Levan Hurst on 07/24/2020 09:00:36 -------------------------------------------------------------------------------- [place] Details Patient Name: Date of Service: [name] 07/24/2020 9:00 A M Medical Record Number: [id number] Patient Account Number: [id number] Date of Birth/Sex: Treating RN: 1951-12-08 (69 y.o. [name] Primary Care Jamyson Jirak: Jori Moll, Karsten Fells Other Clinician: Referring Lakresha Stifter: Treating Peggi Yono/Extender: Oliver Pila, BRITNEY Weeks in Treatment: 8 Vital Signs Time Taken: 08:48 Temperature (F): 98.0 Height (in): 65 Pulse (bpm): 75 Weight (lbs): 144 Respiratory Rate (breaths/min): 16 Body Mass Index (BMI): 24 Blood Pressure (mmHg): 123/74 Reference Range: 80 - 120 mg / dl Electronic Signature(s) Signed: 07/24/2020 10:37:40 AM By: Sandre Kitty Entered By: Sandre Kitty on 07/24/2020 08:48:37

## 2020-07-29 ENCOUNTER — Ambulatory Visit (INDEPENDENT_AMBULATORY_CARE_PROVIDER_SITE_OTHER): Payer: Medicare Other | Admitting: Nurse Practitioner

## 2020-07-29 ENCOUNTER — Other Ambulatory Visit: Payer: Self-pay

## 2020-07-29 ENCOUNTER — Encounter: Payer: Self-pay | Admitting: Nurse Practitioner

## 2020-07-29 VITALS — BP 149/65 | HR 75 | Temp 97.8°F | Resp 20 | Ht 65.0 in | Wt 145.0 lb

## 2020-07-29 DIAGNOSIS — I10 Essential (primary) hypertension: Secondary | ICD-10-CM | POA: Diagnosis not present

## 2020-07-29 DIAGNOSIS — M5136 Other intervertebral disc degeneration, lumbar region: Secondary | ICD-10-CM

## 2020-07-29 MED ORDER — CHLORTHALIDONE 25 MG PO TABS: 25.0000 mg | ORAL_TABLET | Freq: Every day | ORAL | 5 refills | Status: DC

## 2020-07-29 MED ORDER — DILTIAZEM HCL ER COATED BEADS 240 MG PO CP24: 240.0000 mg | ORAL_CAPSULE | Freq: Every morning | ORAL | 3 refills | Status: DC

## 2020-07-29 MED ORDER — TRAMADOL HCL 50 MG PO TABS: 50.0000 mg | ORAL_TABLET | Freq: Two times a day (BID) | ORAL | 0 refills | Status: DC

## 2020-07-29 NOTE — Assessment & Plan Note (Signed)
Patient is a 69 year old female who presents to clinic with degenerative disc disease.  This is a chronic condition.  Patient is reporting controlled pain with tramadol.  Patient was first diagnosed 02/12/2020.  Patient has no new concerns or worsening symptoms.  She describes her pain today as a dull achy pain in her lower back.  Patient rates pain score of 7 out of 10 on a pain scale of 0-10.  Continue to provide education to patient, safety while using narcotics. Provided printed handouts to patients. Medication refill sent to pharmacy. Patient follow-up as needed with worsening or uncontrolled pain.

## 2020-07-29 NOTE — Patient Instructions (Addendum)
DDD (degenerative disc disease), lumbar Patient is a 69 year old female who presents to clinic with degenerative disc disease.  This is a chronic condition.  Patient is reporting controlled pain with tramadol.  Patient was first diagnosed 02/12/2020.  Patient has no new concerns or worsening symptoms.  She describes her pain today as a dull achy pain in her lower back.  Patient rates pain score of 7 out of 10 on a pain scale of 0-10.  Continue to provide education to patient, safety while using narcotics. Provided printed handouts to patients. Medication refill sent to pharmacy. Patient follow-up as needed with worsening or uncontrolled pain.  Essential hypertension Patient is following up today for hypertension patient first diagnosed 01/08/2016.  Patient hypertension is well managed on current medication regimen.  Patient has Cardizem to 40 mg 1 capsule every morning.  Patient is tolerating medication without side effects.  Compliance with treatment has been good including taking medication as directed, maintains a healthy diet and following up as directed. Provided education to patient with printed handouts Rx sent to pharmacy. Follow-up as needed   Degenerative Disk Disease  Degenerative disk disease is a condition caused by changes that occur in the spinal disks as a person ages. Spinal disks are soft and compressible disks located between the bones of your spine (vertebrae). These disks act like shock absorbers. Degenerative disk disease can affect the whole spine. However, the neck and lower back are most often affected. Many changes can occur in the spinal disks with aging, such as:  The spinal disks may dry and shrink.  Small tears may occur in the tough, outer covering of the disk (annulus).  The disk space may become smaller due to loss of water.  Abnormal growths in the bone (spurs) may occur. This can put pressure on the nerve roots exiting the spinal canal, causing pain.  The  spinal canal may become narrowed. What are the causes? This condition may be caused by:  Normal degeneration with age.  Injuries.  Certain activities and sports that cause damage. What increases the risk? The following factors may make you more likely to develop this condition:  Being overweight.  Having a family history of degenerative disk disease.  Smoking.  Sudden injury.  Doing work that requires heavy lifting. What are the signs or symptoms? Symptoms of this condition include:  Pain that varies in intensity. Some people have no pain, while others have severe pain. The location of the pain depends on the part of your backbone that is affected. You may have: ? Pain in your neck or arm if a disk in your neck area is affected. ? Pain in your back, buttocks, or legs if a disk in your lower back is affected.  Pain that becomes worse while bending or reaching up, or with twisting movements.  Pain that may start gradually and then get worse as time passes. It may also start after a major or minor injury.  Numbness or tingling in the arms or legs. How is this diagnosed? This condition may be diagnosed based on:  Your symptoms and medical history.  A physical exam.  Imaging tests, including: ? An X-ray of the spine. ? MRI. How is this treated? This condition may be treated with:  Medicines.  Rehabilitation exercises. These activities aim to strengthen muscles in your back and abdomen to better support your spine. If treatments do not help to relieve your symptoms or you have severe pain, you may need surgery. Follow these  instructions at home: Medicines  Take over-the-counter and prescription medicines only as told by your health care provider.  Do not drive or use heavy machinery while taking prescription pain medicine.  If you are taking prescription pain medicine, take actions to prevent or treat constipation. Your health care provider may recommend that  you: ? Drink enough fluid to keep your urine pale yellow. ? Eat foods that are high in fiber, such as fresh fruits and vegetables, whole grains, and beans. ? Limit foods that are high in fat and processed sugars, such as fried or sweet foods. ? Take an over-the-counter or prescription medicine for constipation. Activity  Rest as told by your health care provider.  Ask your health care provider what activities are safe for you. Return to your normal activities as directed.  Avoid sitting for a long time without moving. Get up to take short walks every 1-2 hours. This is important to improve blood flow and breathing. Ask for help if you feel weak or unsteady.  Perform relaxation exercises as told by your health care provider.  Maintain good posture.  Do not lift anything that is heavier than 10 lb (4.5 kg), or the limit that you are told, until your health care provider says that it is safe.  Follow proper lifting and walking techniques as told by your health care provider. Managing pain, stiffness, and swelling   If directed, put ice on the painful area. Icing can help to relieve pain. ? Put ice in a plastic bag. ? Place a towel between your skin and the bag. ? Leave the ice on for 20 minutes, 2-3 times a day.  If directed, apply heat to the painful area as often as told by your health care provider. Heat can reduce the stiffness of your muscles. Use the heat source that your health care provider recommends, such as a moist heat pack or a heating pad. ? Place a towel between your skin and the heat source. ? Leave the heat on for 20-30 minutes. ? Remove the heat if your skin turns bright red. This is especially important if you are unable to feel pain, heat, or cold. You may have a greater risk of getting burned. General instructions  Change your sitting, standing, and sleeping habits as told by your health care provider.  Avoid sitting in the same position for long periods of time.  Change positions frequently.  Lose weight or maintain a healthy weight as told by your health care provider.  Do not use any products that contain nicotine or tobacco, such as cigarettes and e-cigarettes. If you need help quitting, ask your health care provider.  Wear supportive footwear.  Keep all follow-up visits as told by your health care provider. This is important. This may include visits for physical therapy. Contact a health care provider if you:  Have pain that does not go away within 1-4 weeks.  Lose your appetite.  Lose weight without trying. Get help right away if you:  Have severe pain.  Notice weakness in your arms, hands, or legs.  Begin to lose control of your bladder or bowel movements.  Have fevers or night sweats. Summary  Degenerative disk disease is a condition caused by changes that occur in the spinal disks as a person ages.  Degenerative disk disease can affect the whole spine. However, the neck and lower back are most often affected.  Take over-the-counter and prescription medicines only as told by your health care provider. This information  is not intended to replace advice given to you by your health care provider. Make sure you discuss any questions you have with your health care provider. Document Revised: 12/02/2017 Document Reviewed: 12/02/2017 [employer] Patient Education  2020 [employer].

## 2020-07-29 NOTE — Assessment & Plan Note (Signed)
Patient is following up today for hypertension patient first diagnosed 01/08/2016.  Patient hypertension is well managed on current medication regimen.  Patient has Cardizem to 40 mg 1 capsule every morning.  Patient is tolerating medication without side effects.  Compliance with treatment has been good including taking medication as directed, maintains a healthy diet and following up as directed. Provided education to patient with printed handouts Rx sent to pharmacy. Follow-up as needed

## 2020-07-29 NOTE — Progress Notes (Signed)
Established Patient Office Visit  Subjective:  Patient ID: [name], female    DOB: 01-22-1951  Age: 69 y.o. MRN: [id number]  CC:  Chief Complaint  Patient presents with  . DDD    1 mo follow up     HPI Chrisanna B Jambor presents for Pt presents for follow up of hypertension. Patient was diagnosed in _1/18/2017___. The patient is tolerating the medication well without side effects. Compliance with treatment has been good; including taking medication as directed , maintains a healthy diet and following up as directed.  DDD: Concerning patient's degenerative disc disease.  Patient is reporting controlled pain with tramadol.  Patient was first diagnosed 02/12/2020.  Patient has no new concerns or worsening symptoms..  She describes her pain as a dull aching pain in her lower back.  Patient rates pain score of 7 out of 10 on the pain scale of 0-10.  Past Medical History:  Diagnosis Date  . Arthritis    "fingers, some in my knees" (01/28/2016)  . CAD (coronary artery disease), per cath 04/27/13, non obstructive disease, EF 65-70% 05/11/2013  . Carotid disease, bilateral ([place]) 09/2013   Lt carotid 50-69% stentosis, less on rt  . Dysrhythmia   . Fatty liver   . GERD (gastroesophageal reflux disease)   . Glaucoma   . H/O cardiovascular stress test 12/27/2009   negative for ischemia  . Headache    "occasional since eye pressure regulated" (01/28/2016)  . Hyperlipidemia   . Hypertension   . Leukocytosis 07/15/2015  . Migraine    "stopped w/laser holes to relieve pressure in my eyes; had them 3-4 times/wk before taht" (01/28/2016)  . Mitral regurgitation,2+ by cath 05/11/2013  . NSTEMI (non-ST elevated myocardial infarction) ([place]) 04/27/2013  . Pain    BOTH KNEES - PT HAS TORN MENISCUS LEFT KNEE  . Paroxysmal atrial fibrillation (HCC)    hx of a fib on ASA  AND ELIQUIS   . Polycythemia vera ([place]) 08/20/2015   JAK-2 positive 07/19/15  . PVD (peripheral vascular disease) with claudication ([place])  07/2006   previous L ext iliac stent and rt SFA stent, known occluded Lt SFA  . S/P cardiac cath 04/27/13   NON OBSTRUCTIVE DISEASE, 2+ mr, ef 65-70%  . Statin intolerance   . Stroke ([place]) 2006   SWELLING ALL OVER AND RT SIDE OF FACE DRAWN AND SPEECH SLURRED AND NUMBNESS ON RIGHT SIDE-- ALL RESOLVED    Past Surgical History:  Procedure Laterality Date  . AMPUTATION Right 05/21/2017   Procedure: AMPUTATION ABOVE KNEE;  Surgeon: Rosetta Posner, MD;  Location: [facility];  Service: Vascular;  Laterality: Right;  . CARDIAC CATHETERIZATION  04/27/13   non occlusive disease with mild to mod. calcified lesions in ostial LM and proximal LAD and moderate prox. RC AND DISTAL AV GROOVE LCX, ef  . CATARACT EXTRACTION W/PHACO Right 03/21/2015   Procedure: CATARACT EXTRACTION PHACO AND INTRAOCULAR LENS PLACEMENT RIGHT EYE;  Surgeon: Baruch Goldmann, MD;  Location: [facility];  Service: Ophthalmology;  Laterality: Right;  CDE:5.10  . CATARACT EXTRACTION W/PHACO Left 05/16/2015   Procedure: CATARACT EXTRACTION PHACO AND INTRAOCULAR LENS PLACEMENT (IOC);  Surgeon: Baruch Goldmann, MD;  Location: [facility];  Service: Ophthalmology;  Laterality: Left;  CDE 5.57  . [place]  . COLONOSCOPY N/A 09/11/2016   Procedure: COLONOSCOPY;  Surgeon: Rogene Houston, MD;  [facility];  Service: Endoscopy;  Laterality: N/A;  730-moved to 1:00 Ann notified pt  .  EMBOLECTOMY Right 02/01/2016   Procedure: Thrombectomy of Right Femoral-Popliteal Bypass Graft; Endarterectomy of Right Below Knee Popliteal Artery and Tibial Peroneal Trunk with Bovine Pericardium Patch Angioplasty.;  Surgeon: Angelia Mould, MD;  Location: [facility];  Service: Vascular;  Laterality: Right;  . FEMORAL-POPLITEAL BYPASS GRAFT Right 01/31/2016   Procedure: BYPASS GRAFT RIGHT COMMON  FEMORALTO BELOW KNEE POPLITEAL ARTERY BYPASS GRAFT USING 6MM PROPATEN GORTEX GRAFT;  Surgeon: Mal Misty, MD;  Location: [facility];  Service: Vascular;  Laterality: Right;    . FEMORAL-POPLITEAL BYPASS GRAFT Right 01/09/2017   Procedure: THROMBECTOMY OF RIGHT FEMORAL-POPLITEAL  ARTERY BYPASS GRAFT; THROMBECTOMY RIGHT  TIBIAL VESSELS;  Surgeon: [name], MD;  Location: [place];  Service: Vascular;  Laterality: Right;  . FEMORAL-POPLITEAL BYPASS GRAFT Right 05/17/2017   Procedure: RIGHT FEMORAL-TIBIAL PERONEAL TRUNK ARTERY BYPASS GRAFT USING 41mmX80cm PROPATEN GRAFT WITH REMOVABLE RING;  Surgeon: Rosetta Posner, MD;  Location: [facility];  Service: Vascular;  Laterality: Right;  . FRACTURE SURGERY    . ILIAC ARTERY STENT  07/2006   external iliac and Rt SFA stent 07/2006 AND THE LEFT WAS IN 2006  . KNEE ARTHROSCOPY WITH MEDIAL MENISECTOMY Left 03/27/2014   Procedure: left knee arthorscopy with medial chondraplasty of the medial femoral and patella, medial microfracture technique of medial femoral condyl;  Surgeon: Tobi Bastos, MD;  Location: [facility];  Service: Orthopedics;  Laterality: Left;  . LEFT HEART CATHETERIZATION WITH CORONARY ANGIOGRAM Right 04/27/2013   Procedure: LEFT HEART CATHETERIZATION WITH CORONARY ANGIOGRAM;  Surgeon: Leonie Man, MD;  Location: [facility];  Service: Cardiovascular;  Laterality: Right;  . LOWER EXTREMITY ANGIOGRAM Right 01/31/2016   Procedure: INTRAOP RIGHT LOWER EXTREMITY ANGIOGRAM;  Surgeon: Mal Misty, MD;  Location: [place];  Service: Vascular;  Laterality: Right;  . ORIF WRIST FRACTURE Right 1983  . OVARIAN CYST REMOVAL Right   . PATCH ANGIOPLASTY Right 01/31/2016   Procedure: RIGHT COMMON FEMORAL AND PROFUNDA FEMORIS ENDARECTOMY WITH PATCH ANGIOPLASTY;  Surgeon: Mal Misty, MD;  Location: [place];  Service: Vascular;  Laterality: Right;  . PATCH ANGIOPLASTY Right 01/09/2017   Procedure: PATCH ANGIOPLASTY RIGHT POPLITEAL ARTERY BYPASS GRAFT;  Surgeon: [name], MD;  Location: [facility] [place];  Service: Vascular;  Laterality: Right;  . PERIPHERAL VASCULAR CATHETERIZATION N/A 06/27/2015   Procedure: Lower Extremity Angiography;   Surgeon: Lorretta Harp, MD;  Location: [facility];  Service: Cardiovascular;  Laterality: N/A;  . PERIPHERAL VASCULAR CATHETERIZATION Bilateral 01/29/2016   Procedure: Lower Extremity Angiography;  Surgeon: Wellington Hampshire, MD;  Location: [facility];  Service: Cardiovascular;  Laterality: Bilateral;  . PERIPHERAL VASCULAR CATHETERIZATION N/A 01/29/2016   Procedure: Abdominal Aortogram;  Surgeon: Wellington Hampshire, MD;  Location: [facility];  Service: Cardiovascular;  Laterality: N/A;  . REFRACTIVE SURGERY Bilateral    "6 in one eye; 7 in the other; to relieve pressure; not glaucoma" (01/28/2016)  . SFA Right 06/27/2015   overlapping Bahn covered stents  . TUBAL LIGATION  1983  . VEIN HARVEST Left 05/17/2017   Procedure: LEFT LEG GREATER SAPHENOUS VEIN HARVEST;  Surgeon: Rosetta Posner, MD;  Location: [facility];  Service: Vascular;  Laterality: Left;  Marland Kitchen VEIN REPAIR Right 01/31/2016   Procedure: RIGHT GREATER SAPHENOUS VEIN EXAMNED BUT NOT REMOVED;  Surgeon: Mal Misty, MD;  Location: [facility];  Service: Vascular;  Laterality: Right;    Family History  Problem Relation Age of Onset  .  CAD Mother   . Lung cancer Mother   . Heart attack Mother   . CAD Father   . Heart disease Father   . Heart attack Father 24       died in hes 44's with heart disease  . Healthy Sister   . Liver cancer Brother   . Healthy Sister   . CAD Brother        has had CABG  . CAD Brother     Social History   Socioeconomic History  . Marital status: Widowed    Spouse name: Not on file  . Number of children: 2  . Years of education: 32  . Highest education level: Not on file  Occupational History  . Occupation: CNA  Tobacco Use  . Smoking status: Former Smoker    Packs/day: 1.00    Years: 45.00    Pack years: 45.00    Types: Cigarettes    Quit date: 11/11/2012    Years since quitting: 7.7  . Smokeless tobacco: Never Used  . Tobacco comment: quit 4 years ago  Vaping Use  . Vaping  Use: Never used  Substance and Sexual Activity  . Alcohol use: No    Alcohol/week: 0.0 standard drinks  . Drug use: No  . Sexual activity: Not Currently    Birth control/protection: None  Other Topics Concern  . Not on file  Social History Narrative  . Not on file   Social Determinants of Health   Financial Resource Strain:   . Difficulty of Paying Living Expenses:   Food Insecurity:   . Worried About Charity fundraiser in the Last Year:   . Arboriculturist in the Last Year:   Transportation Needs:   . Film/video editor (Medical):   Marland Kitchen Lack of Transportation (Non-Medical):   Physical Activity:   . Days of Exercise per Week:   . Minutes of Exercise per Session:   Stress:   . Feeling of Stress :   Social Connections:   . Frequency of Communication with Friends and Family:   . Frequency of Social Gatherings with Friends and Family:   . Attends Religious Services:   . Active Member of Clubs or Organizations:   . Attends Archivist Meetings:   Marland Kitchen Marital Status:   Intimate Partner Violence:   . Fear of Current or Ex-Partner:   . Emotionally Abused:   Marland Kitchen Physically Abused:   . Sexually Abused:     Outpatient Medications Prior to Visit  Medication Sig Dispense Refill  . acetaminophen (TYLENOL) 500 MG tablet Take 2 tablets (1,000 mg total) by mouth every 6 (six) hours. 30 tablet 0  . alendronate (FOSAMAX) 70 MG tablet Take 1 tablet (70 mg total) by mouth once a week. 4 tablet 0  . apixaban (ELIQUIS) 5 MG TABS tablet Take 1 tablet (5 mg total) by mouth 2 (two) times daily. 60 tablet 0  . ARTIFICIAL TEAR OP Place 1 drop into both eyes as needed (for dry eyes).    Marland Kitchen aspirin EC 325 MG tablet Take 325 mg by mouth daily.    Marland Kitchen escitalopram (LEXAPRO) 10 MG tablet Take 1 tablet (10 mg total) by mouth daily. 30 tablet 5  . ezetimibe (ZETIA) 10 MG tablet Take 1 tablet (10 mg total) by mouth daily. 90 tablet 3  . fenofibrate (TRICOR) 145 MG tablet Take 1 tablet (145 mg  total) by mouth at bedtime. PLEASE CONTACT OFFICE FOR ADDITIONAL REFILLS 30  tablet 0  . flecainide (TAMBOCOR) 50 MG tablet Take 1 tablet (50 mg total) by mouth 2 (two) times daily. PLEASE CONTACT OFFICE FOR ADDITIONAL REFILLS 60 tablet 11  . hydrOXYzine (VISTARIL) 25 MG capsule Take 50 mg by mouth 3 (three) times daily as needed.    . mupirocin ointment (BACTROBAN) 2 % APPLY TO AFFECTED AREA 3)TIMES A DAY FOR 1 WEEK    . omeprazole (PRILOSEC) 20 MG capsule Take 1 capsule (20 mg total) by mouth 2 (two) times daily before a meal. 60 capsule 0  . predniSONE (DELTASONE) 5 MG tablet Take by mouth.    . SSD 1 % cream APPLY CREAM EXTERNALLY TO AFFECTED AREA ONCE DAILY    . sulfamethoxazole-trimethoprim (BACTRIM DS) 800-160 MG tablet Take 1 tablet by mouth 2 (two) times daily.    Marland Kitchen triamcinolone cream (KENALOG) 0.1 % Apply 1 g topically 1 day or 1 dose.    . valsartan (DIOVAN) 160 MG tablet Take 1 tablet (160 mg total) by mouth 2 (two) times daily. 180 tablet 1  . chlorthalidone (HYGROTON) 25 MG tablet TAKE 1 TABLET BY MOUTH DAILY. 30 tablet 5  . diltiazem (CARDIZEM CD) 240 MG 24 hr capsule Take 1 capsule (240 mg total) by mouth every morning. 90 capsule 3  . traMADol (ULTRAM) 50 MG tablet Take 1 tablet (50 mg total) by mouth 2 (two) times daily. 60 tablet 0  . doxycycline (VIBRA-TABS) 100 MG tablet Take 100 mg by mouth 2 (two) times daily.     No facility-administered medications prior to visit.    Allergies  Allergen Reactions  . Atenolol Rash and Itching  . Demerol  [Meperidine Hcl] Itching  . Demerol [Meperidine] Other (See Comments) and Itching    Unknown, can't remember  Unknown, can't remember   . Gabapentin Anxiety and Other (See Comments)    SI and HI  . Inderal [Propranolol] Other (See Comments) and Itching    Doesn't remember   . Prednisone Other (See Comments)    Muscle spasms  . Statins Itching and Other (See Comments)    Simvastatin-caused severe itching Pravastatin-caused  lesser tiching One type of statin? Caused stroke in 2006, and other symptoms as result  . Warfarin And Related Other (See Comments)    Caused nose bleeds  . Crestor [Rosuvastatin] Itching and Rash  . Docosahexaenoic Acid (Dha)     Other reaction(s): Other (See Comments) nosebleeds  . Lactose Intolerance (Gi) Other (See Comments)    Bloating, gas  . Lipitor [Atorvastatin] Rash  . Lovaza [Omega-3-Acid Ethyl Esters] Other (See Comments)    nosebleeds   . Penicillins Hives, Rash and Other (See Comments)    Has patient had a PCN reaction causing immediate rash, facial/tongue/throat swelling, SOB or lightheadedness with hypotension: Yes Has patient had a PCN reaction causing severe rash involving mucus membranes or skin necrosis: No Has patient had a PCN reaction that required hospitalization: No Has patient had a PCN reaction occurring within the last 10 years: No If all of the above answers are "NO", then may proceed with Cephalosporin use.  Questionable high fever   . Pravastatin Itching and Rash  . Simvastatin Itching, Rash and Other (See Comments)  . Trilipix [[place]oline Fenofibrate] Other (See Comments)    Doesn't remember   . Warfarin Other (See Comments)    nosebleeds  . Hydralazine Swelling  . Effexor [Venlafaxine Hcl] Other (See Comments)    Doesn't remember   . Nexium [Esomeprazole Magnesium] Rash  . Percocet [  Oxycodone-Acetaminophen] Other (See Comments)    Doesn't remember   . Plavix [Clopidogrel Bisulfate] Rash    ROS Review of Systems  Constitutional: Negative.   HENT: Negative.   Eyes: Negative.   Respiratory: Negative.   Cardiovascular: Negative.   Gastrointestinal: Negative.   Genitourinary: Negative.   Musculoskeletal: Positive for back pain and gait problem.  Psychiatric/Behavioral: The patient is not nervous/anxious.       Objective:    Physical Exam Constitutional:      Appearance: Normal appearance.  HENT:     Head: Normocephalic.      Mouth/Throat:     Mouth: Mucous membranes are moist.  Eyes:     Conjunctiva/sclera: Conjunctivae normal.  Cardiovascular:     Rate and Rhythm: Normal rate and regular rhythm.     Pulses: Normal pulses.     Heart sounds: Normal heart sounds.  Pulmonary:     Effort: Pulmonary effort is normal.     Breath sounds: Normal breath sounds.  Abdominal:     General: Bowel sounds are normal.  Musculoskeletal:        General: Tenderness and deformity present.  Skin:    General: Skin is dry.  Neurological:     Mental Status: She is alert and oriented to person, place, and time.  Psychiatric:        Mood and Affect: Mood normal.     BP (!) 149/65   Pulse 75   Temp 97.8 F (36.6 C)   Resp 20   Ht 5\' 5"  (1.651 m)   Wt 145 lb (65.8 kg)   SpO2 96%   BMI 24.13 kg/m  Wt Readings from Last 3 Encounters:  07/29/20 145 lb (65.8 kg)  07/15/20 145 lb (65.8 kg)  06/28/20 141 lb (64 kg)     Health Maintenance Due  Topic Date Due  . Hepatitis C Screening  Never done  . COVID-19 Vaccine (1) Never done  . MAMMOGRAM  Never done  . DEXA SCAN  Never done  . PNA vac Low Risk Adult (1 of 2 - PCV13) Never done  . INFLUENZA VACCINE  07/21/2020    There are no preventive care reminders to display for this patient.  Lab Results  Component Value Date   TSH 1.550 06/28/2020   Lab Results  Component Value Date   WBC 17.5 (H) 07/15/2020   HGB 12.1 07/15/2020   HCT 42.3 07/15/2020   MCV 79.7 (L) 07/15/2020   PLT 609 (H) 07/15/2020   Lab Results  Component Value Date   NA 136 11/24/2019   K 4.8 11/24/2019   CO2 18 (L) 11/24/2019   GLUCOSE 89 11/24/2019   BUN 21 11/24/2019   CREATININE 0.93 11/24/2019   BILITOT 0.2 08/15/2019   ALKPHOS 62 08/15/2019   AST 18 08/15/2019   ALT 20 08/15/2019   PROT 6.8 08/15/2019   ALBUMIN 4.1 08/15/2019   CALCIUM 9.0 11/24/2019   ANIONGAP 10 09/12/2018   Lab Results  Component Value Date   CHOL 162 06/28/2020   Lab Results  Component Value Date    HDL 42 06/28/2020   Lab Results  Component Value Date   LDLCALC 88 06/28/2020   Lab Results  Component Value Date   TRIG 189 (H) 06/28/2020   Lab Results  Component Value Date   CHOLHDL 3.9 06/28/2020   Lab Results  Component Value Date   HGBA1C 5.7 (H) 01/28/2016      Assessment & Plan:   Problem List Items  Addressed This Visit      Cardiovascular and Mediastinum   Essential hypertension - Primary    Patient is following up today for hypertension patient first diagnosed 01/08/2016.  Patient hypertension is well managed on current medication regimen.  Patient has Cardizem to 40 mg 1 capsule every morning.  Patient is tolerating medication without side effects.  Compliance with treatment has been good including taking medication as directed, maintains a healthy diet and following up as directed. Provided education to patient with printed handouts Rx sent to pharmacy. Follow-up as needed      Relevant Medications   chlorthalidone (HYGROTON) 25 MG tablet   diltiazem (CARDIZEM CD) 240 MG 24 hr capsule     Musculoskeletal and Integument   DDD (degenerative disc disease), lumbar    Patient is a 69 year old female who presents to clinic with degenerative disc disease.  This is a chronic condition.  Patient is reporting controlled pain with tramadol.  Patient was first diagnosed 02/12/2020.  Patient has no new concerns or worsening symptoms.  She describes her pain today as a dull achy pain in her lower back.  Patient rates pain score of 7 out of 10 on a pain scale of 0-10.  Continue to provide education to patient, safety while using narcotics. Provided printed handouts to patients. Medication refill sent to pharmacy. Patient follow-up as needed with worsening or uncontrolled pain.      Relevant Medications   traMADol (ULTRAM) 50 MG tablet      Meds ordered this encounter  Medications  . chlorthalidone (HYGROTON) 25 MG tablet    Sig: Take 1 tablet (25 mg total) by mouth  daily.    Dispense:  30 tablet    Refill:  5  . diltiazem (CARDIZEM CD) 240 MG 24 hr capsule    Sig: Take 1 capsule (240 mg total) by mouth every morning.    Dispense:  90 capsule    Refill:  3    Order Specific Question:   Supervising Provider    Answer:   Caryl Pina A [id number]  . traMADol (ULTRAM) 50 MG tablet    Sig: Take 1 tablet (50 mg total) by mouth 2 (two) times daily.    Dispense:  60 tablet    Refill:  0    Order Specific Question:   Supervising Provider    Answer:   Caryl Pina A [[id number]    Follow-up: Return if symptoms worsen or fail to improve.    Ivy Lynn, NP

## 2020-08-01 ENCOUNTER — Other Ambulatory Visit (HOSPITAL_COMMUNITY): Payer: Self-pay | Admitting: Cardiovascular Disease

## 2020-08-01 DIAGNOSIS — Z95828 Presence of other vascular implants and grafts: Secondary | ICD-10-CM

## 2020-08-05 ENCOUNTER — Other Ambulatory Visit: Payer: Self-pay

## 2020-08-05 ENCOUNTER — Inpatient Hospital Stay: Payer: Medicare Other | Attending: Hematology

## 2020-08-05 DIAGNOSIS — Z7689 Persons encountering health services in other specified circumstances: Secondary | ICD-10-CM | POA: Diagnosis not present

## 2020-08-05 DIAGNOSIS — D45 Polycythemia vera: Secondary | ICD-10-CM | POA: Insufficient documentation

## 2020-08-05 DIAGNOSIS — D500 Iron deficiency anemia secondary to blood loss (chronic): Secondary | ICD-10-CM

## 2020-08-05 LAB — CBC WITH DIFFERENTIAL (CANCER CENTER ONLY)
Abs Immature Granulocytes: 0.37 10*3/uL — ABNORMAL HIGH (ref 0.00–0.07)
Basophils Absolute: 0.2 10*3/uL — ABNORMAL HIGH (ref 0.0–0.1)
Basophils Relative: 1 %
Eosinophils Absolute: 0.4 10*3/uL (ref 0.0–0.5)
Eosinophils Relative: 2 %
HCT: 48.2 % — ABNORMAL HIGH (ref 36.0–46.0)
Hemoglobin: 14.1 g/dL (ref 12.0–15.0)
Immature Granulocytes: 2 %
Lymphocytes Relative: 6 %
Lymphs Abs: 1.1 10*3/uL (ref 0.7–4.0)
MCH: 24.9 pg — ABNORMAL LOW (ref 26.0–34.0)
MCHC: 29.3 g/dL — ABNORMAL LOW (ref 30.0–36.0)
MCV: 85.2 fL (ref 80.0–100.0)
Monocytes Absolute: 0.5 10*3/uL (ref 0.1–1.0)
Monocytes Relative: 3 %
Neutro Abs: 16.8 10*3/uL — ABNORMAL HIGH (ref 1.7–7.7)
Neutrophils Relative %: 86 %
Platelet Count: 483 10*3/uL — ABNORMAL HIGH (ref 150–400)
RBC: 5.66 MIL/uL — ABNORMAL HIGH (ref 3.87–5.11)
RDW: 24.5 % — ABNORMAL HIGH (ref 11.5–15.5)
WBC Count: 19.3 10*3/uL — ABNORMAL HIGH (ref 4.0–10.5)
nRBC: 0.2 % (ref 0.0–0.2)

## 2020-08-05 LAB — IRON AND TIBC
Iron: 47 ug/dL (ref 41–142)
Saturation Ratios: 12 % — ABNORMAL LOW (ref 21–57)
TIBC: 374 ug/dL (ref 236–444)
UIBC: 327 ug/dL (ref 120–384)

## 2020-08-05 LAB — FERRITIN: Ferritin: 106 ng/mL (ref 11–307)

## 2020-08-06 ENCOUNTER — Telehealth: Payer: Self-pay

## 2020-08-06 NOTE — Telephone Encounter (Signed)
-----   Message from Truitt Merle, MD sent at 08/06/2020 11:13 AM EDT ----- Please let pt know her IDA resolved, iron level adequate and please stop oral iron supplement. Will continue to hold hydrea for now, until her next visit with [place] in 3 weeks, please encourage her to bring stool card back to [place] on next visit, or mail to [place] if she can, thanks   [employer]  08/06/2020

## 2020-08-06 NOTE — Telephone Encounter (Signed)
I reviewed Dr. Ernestina Penna recommendations with Ms Eland. She verbalized understanding.

## 2020-08-06 NOTE — Telephone Encounter (Signed)
duplicate

## 2020-08-07 ENCOUNTER — Encounter (HOSPITAL_BASED_OUTPATIENT_CLINIC_OR_DEPARTMENT_OTHER): Payer: Medicare Other | Admitting: Physician Assistant

## 2020-08-14 ENCOUNTER — Encounter (HOSPITAL_COMMUNITY): Payer: Medicare Other

## 2020-08-14 ENCOUNTER — Ambulatory Visit (HOSPITAL_BASED_OUTPATIENT_CLINIC_OR_DEPARTMENT_OTHER)
Admission: RE | Admit: 2020-08-14 | Discharge: 2020-08-14 | Disposition: A | Discharge status: Home / Self Care | Payer: Medicare Other | Source: Ambulatory Visit | Attending: Cardiovascular Disease | Admitting: Cardiovascular Disease

## 2020-08-14 ENCOUNTER — Ambulatory Visit (HOSPITAL_COMMUNITY)
Admission: RE | Admit: 2020-08-14 | Discharge: 2020-08-14 | Disposition: A | Discharge status: Home / Self Care | Payer: Medicare Other | Source: Ambulatory Visit | Attending: Internal Medicine | Admitting: Internal Medicine

## 2020-08-14 ENCOUNTER — Other Ambulatory Visit: Payer: Self-pay

## 2020-08-14 ENCOUNTER — Ambulatory Visit (HOSPITAL_BASED_OUTPATIENT_CLINIC_OR_DEPARTMENT_OTHER)
Admission: RE | Admit: 2020-08-14 | Discharge: 2020-08-14 | Disposition: A | Discharge status: Home / Self Care | Payer: Medicare Other | Source: Ambulatory Visit | Attending: Internal Medicine | Admitting: Internal Medicine

## 2020-08-14 DIAGNOSIS — I739 Peripheral vascular disease, unspecified: Secondary | ICD-10-CM | POA: Diagnosis not present

## 2020-08-14 DIAGNOSIS — Z95828 Presence of other vascular implants and grafts: Secondary | ICD-10-CM | POA: Insufficient documentation

## 2020-08-14 DIAGNOSIS — I6529 Occlusion and stenosis of unspecified carotid artery: Secondary | ICD-10-CM

## 2020-08-14 DIAGNOSIS — Z7689 Persons encountering health services in other specified circumstances: Secondary | ICD-10-CM | POA: Diagnosis not present

## 2020-08-16 ENCOUNTER — Other Ambulatory Visit: Payer: Self-pay | Admitting: Cardiovascular Disease

## 2020-08-16 ENCOUNTER — Other Ambulatory Visit: Payer: Self-pay | Admitting: Family Medicine

## 2020-08-16 DIAGNOSIS — M810 Age-related osteoporosis without current pathological fracture: Secondary | ICD-10-CM

## 2020-08-19 ENCOUNTER — Other Ambulatory Visit: Payer: Self-pay

## 2020-08-19 DIAGNOSIS — I214 Non-ST elevation (NSTEMI) myocardial infarction: Secondary | ICD-10-CM

## 2020-08-19 DIAGNOSIS — Z8673 Personal history of transient ischemic attack (TIA), and cerebral infarction without residual deficits: Secondary | ICD-10-CM

## 2020-08-19 DIAGNOSIS — F411 Generalized anxiety disorder: Secondary | ICD-10-CM

## 2020-08-19 MED ORDER — ESCITALOPRAM OXALATE 10 MG PO TABS: 10.0000 mg | ORAL_TABLET | Freq: Every day | ORAL | 2 refills | Status: DC

## 2020-08-19 MED ORDER — EZETIMIBE 10 MG PO TABS: 10.0000 mg | ORAL_TABLET | Freq: Every day | ORAL | 0 refills | Status: DC

## 2020-08-21 ENCOUNTER — Other Ambulatory Visit: Payer: Self-pay

## 2020-08-21 ENCOUNTER — Encounter (HOSPITAL_BASED_OUTPATIENT_CLINIC_OR_DEPARTMENT_OTHER): Payer: Medicare Other | Attending: Physician Assistant | Admitting: Physician Assistant

## 2020-08-21 DIAGNOSIS — H409 Unspecified glaucoma: Secondary | ICD-10-CM | POA: Diagnosis not present

## 2020-08-21 DIAGNOSIS — Z89611 Acquired absence of right leg above knee: Secondary | ICD-10-CM | POA: Diagnosis not present

## 2020-08-21 DIAGNOSIS — M109 Gout, unspecified: Secondary | ICD-10-CM | POA: Diagnosis not present

## 2020-08-21 DIAGNOSIS — Z8673 Personal history of transient ischemic attack (TIA), and cerebral infarction without residual deficits: Secondary | ICD-10-CM | POA: Diagnosis not present

## 2020-08-21 DIAGNOSIS — I872 Venous insufficiency (chronic) (peripheral): Secondary | ICD-10-CM | POA: Diagnosis not present

## 2020-08-21 DIAGNOSIS — L97822 Non-pressure chronic ulcer of other part of left lower leg with fat layer exposed: Secondary | ICD-10-CM | POA: Diagnosis not present

## 2020-08-21 DIAGNOSIS — I480 Paroxysmal atrial fibrillation: Secondary | ICD-10-CM | POA: Diagnosis not present

## 2020-08-21 DIAGNOSIS — Z89511 Acquired absence of right leg below knee: Secondary | ICD-10-CM | POA: Insufficient documentation

## 2020-08-21 DIAGNOSIS — Z7689 Persons encountering health services in other specified circumstances: Secondary | ICD-10-CM | POA: Diagnosis not present

## 2020-08-21 DIAGNOSIS — I70248 Atherosclerosis of native arteries of left leg with ulceration of other part of lower left leg: Secondary | ICD-10-CM | POA: Diagnosis not present

## 2020-08-21 DIAGNOSIS — M1990 Unspecified osteoarthritis, unspecified site: Secondary | ICD-10-CM | POA: Diagnosis not present

## 2020-08-21 DIAGNOSIS — Z7901 Long term (current) use of anticoagulants: Secondary | ICD-10-CM | POA: Diagnosis not present

## 2020-08-21 DIAGNOSIS — I739 Peripheral vascular disease, unspecified: Secondary | ICD-10-CM | POA: Insufficient documentation

## 2020-08-21 DIAGNOSIS — I10 Essential (primary) hypertension: Secondary | ICD-10-CM | POA: Diagnosis not present
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
## 2020-08-21 NOTE — Progress Notes (Signed)
CASSANDRA, HARBOLD ([id number]) Visit Report for 08/21/2020 Arrival Information Details Patient Name: Date of Service: [place], [name] 08/21/2020 10:00 A M Medical Record Number: [id number] Patient Account Number: [id number] Date of Birth/Sex: Treating RN: 02/27/1951 (69 y.o. [name] Primary Care Gumaro Brightbill: Jori Moll, Karsten Fells Other Clinician: Referring Vidur Knust: Treating Cyrilla Durkin/Extender: Baron Hamper Weeks in Treatment: 12 Visit Information History Since Last Visit Added or deleted any medications: No Patient Arrived: [place] Any new allergies or adverse reactions: No Arrival Time: 09:58 Had a fall or experienced change in No Accompanied By: self activities of daily living that may affect Transfer Assistance: None risk of falls: Patient Identification Verified: Yes Signs or symptoms of abuse/neglect since last visito No Secondary Verification Process Completed: Yes Hospitalized since last visit: No Patient Requires Transmission-Based Precautions: No Implantable device outside of the clinic excluding No Patient Has Alerts: Yes cellular tissue based products placed in the center Patient Alerts: Patient on Blood Thinner since last visit: L ABI non compressible Has Dressing in Place as Prescribed: Yes Pain Present Now: No Electronic Signature(s) Signed: 08/21/2020 11:00:36 AM By: Sandre Kitty Entered By: Sandre Kitty on 08/21/2020 10:01:29 -------------------------------------------------------------------------------- Encounter Discharge Information Details Patient Name: Date of Service: [name], [name] 08/21/2020 10:00 A M Medical Record Number: [id number] Patient Account Number: [id number] Date of Birth/Sex: Treating RN: November 20, 1951 (69 y.o. [name] Primary Care Margreat Widener: Jori Moll, Karsten Fells Other Clinician: Referring Lenn Volker: Treating Yolunda Kloos/Extender: Oliver Pila, BRITNEY Weeks in Treatment: 12 Encounter Discharge Information  Items Post Procedure Vitals Discharge Condition: Stable Temperature (F): 97.9 Ambulatory Status: [place] Pulse (bpm): 78 Discharge Destination: Home Respiratory Rate (breaths/min): 16 Transportation: Private Auto Blood Pressure (mmHg): 175/75 Accompanied By: self Schedule Follow-up Appointment: Yes Clinical Summary of Care: Patient Declined Electronic Signature(s) Signed: 08/21/2020 5:45:10 PM By: Carlene Coria RN Entered By: Carlene Coria on 08/21/2020 11:36:07 -------------------------------------------------------------------------------- Lower Extremity Assessment Details Patient Name: Date of Service: [place], [name] 08/21/2020 10:00 A M Medical Record Number: [id number] Patient Account Number: [id number] Date of Birth/Sex: Treating RN: 07-Feb-1951 (69 y.o. [name] Primary Care Tayveon Lombardo: Jori Moll, Karsten Fells Other Clinician: Referring Iden Stripling: Treating Mariya Mottley/Extender: Oliver Pila, BRITNEY Weeks in Treatment: 12 Edema Assessment Assessed: [Left: No] [Right: No] Edema: [Left: Ye] [Right: s] Calf Left: Right: Point of Measurement: 28 cm From Medial Instep 33 cm cm Ankle Left: Right: Point of Measurement: 9 cm From Medial Instep 19 cm cm Electronic Signature(s) Signed: 08/21/2020 5:45:10 PM By: Carlene Coria RN Entered By: Carlene Coria on 08/21/2020 10:14:56 -------------------------------------------------------------------------------- Multi-Disciplinary Care Plan Details Patient Name: Date of Service: [name], [name] 08/21/2020 10:00 A M Medical Record Number: [id number] Patient Account Number: [id number] Date of Birth/Sex: Treating RN: 20-Apr-1951 (69 y.o. [name] Primary Care Amela Handley: Jori Moll, Karsten Fells Other Clinician: Referring Samaia Iwata: Treating Meghan Tiemann/Extender: Oliver Pila, BRITNEY Weeks in Treatment: 12 Active Inactive Wound/Skin Impairment Nursing Diagnoses: Knowledge deficit related to ulceration/compromised skin  integrity Goals: Patient/caregiver will verbalize understanding of skin care regimen Date Initiated: 05/29/2020 Target Resolution Date: 09/18/2020 Goal Status: Active Interventions: Assess patient/caregiver ability to perform ulcer/skin care regimen upon admission and as needed Assess ulceration(s) every visit Provide education on ulcer and skin care Treatment Activities: Skin care regimen initiated : 05/29/2020 Topical wound management initiated : 05/29/2020 Notes: Electronic Signature(s) Signed: 08/21/2020 6:44:49 PM By: Baruch Gouty RN, [name] Entered By: Baruch Gouty on 08/21/2020 11:06:03 -------------------------------------------------------------------------------- Pain Assessment Details Patient  Name: Date of Service: [place], [name] 08/21/2020 10:00 A M Medical Record Number: [id number] Patient Account Number: [id number] Date of Birth/Sex: Treating RN: 1951-03-23 (69 y.o. [name] Primary Care Malin Cervini: Jori Moll, Karsten Fells Other Clinician: Referring Dezzie Badilla: Treating Charles Andringa/Extender: Oliver Pila, BRITNEY Weeks in Treatment: 12 Active Problems Location of Pain Severity and Description of Pain Patient Has Paino No Site Locations Pain Management and Medication Current Pain Management: Electronic Signature(s) Signed: 08/21/2020 11:00:36 AM By: Sandre Kitty Signed: 08/21/2020 6:44:49 PM By: Baruch Gouty RN, [name] Entered By: Sandre Kitty on 08/21/2020 10:03:22 -------------------------------------------------------------------------------- Patient/Caregiver Education Details Patient Name: Date of Service: [name], [name] 9/1/2021andnbsp10:00 A M Medical Record Number: [id number] Patient Account Number: [id number] Date of Birth/Gender: Treating RN: December 11, 1951 (69 y.o. [name] Primary Care Physician: Jori Moll, Karsten Fells Other Clinician: Referring Physician: Treating Physician/Extender: Baron Hamper Weeks in  Treatment: 12 Education Assessment Education Provided To: Patient Education Topics Provided Wound/Skin Impairment: Methods: Explain/Verbal Responses: Reinforcements needed, State content correctly Electronic Signature(s) Signed: 08/21/2020 6:44:49 PM By: Baruch Gouty RN, [name] Entered By: Baruch Gouty on 08/21/2020 11:07:32 -------------------------------------------------------------------------------- Wound Assessment Details Patient Name: Date of Service: [name], [name] 08/21/2020 10:00 A M Medical Record Number: [id number] Patient Account Number: [id number] Date of Birth/Sex: Treating RN: [birth year] (69 y.o. Martyn Malay, [name] Primary Care Cannan Beeck: Jori Moll, Karsten Fells Other Clinician: Referring Zahria Ding: Treating Rashena Dowling/Extender: Oliver Pila, BRITNEY Weeks in Treatment: 12 Wound Status Wound Number: 10 Primary Arterial Insufficiency Ulcer Etiology: Wound Location: Left, Lateral Lower Leg Wound Open Wounding Event: Gradually Appeared Status: Date Acquired: 07/24/2020 Comorbid Glaucoma, Arrhythmia, Hypertension, Peripheral Arterial Disease, Weeks Of Treatment: 4 History: Peripheral Venous Disease, Gout, Osteoarthritis Clustered Wound: No Wound Measurements Length: (cm) 0.7 Width: (cm) 0.3 Depth: (cm) 0.1 Area: (cm) 0.165 Volume: (cm) 0.016 % Reduction in Area: 34.3% % Reduction in Volume: 36% Epithelialization: None Tunneling: No Undermining: No Wound Description Classification: Full Thickness Without Exposed Support Structures Wound Margin: Flat and Intact Exudate Amount: Medium Exudate Type: Serosanguineous Exudate Color: red, brown Foul Odor After Cleansing: No Slough/Fibrino Yes Wound Bed Granulation Amount: Small (1-33%) Exposed Structure Granulation Quality: Pink Fascia Exposed: No Necrotic Amount: Large (67-100%) Fat Layer (Subcutaneous Tissue) Exposed: Yes Necrotic Quality: Adherent Slough Tendon Exposed: No Muscle Exposed:  No Joint Exposed: No Bone Exposed: No Treatment Notes Wound #10 (Left, Lateral Lower Leg) 1. Cleanse With Wound Cleanser 2. Periwound Care TCA Cream 3. Primary Dressing Applied Calcium Alginate Ag 4. Secondary Dressing Dry Gauze Roll Gauze 5. Secured With Recruitment consultant) Signed: 08/21/2020 5:45:10 PM By: Carlene Coria RN Signed: 08/21/2020 6:44:49 PM By: Baruch Gouty RN, [name] Entered By: Carlene Coria on 08/21/2020 10:15:09 -------------------------------------------------------------------------------- Wound Assessment Details Patient Name: Date of Service: [name], [name] 08/21/2020 10:00 A M Medical Record Number: [id number] Patient Account Number: [id number] Date of Birth/Sex: Treating RN: 05/29/1951 (69 y.o. [name] Primary Care Rie Mcneil: Jori Moll, Karsten Fells Other Clinician: Referring Sylvania Moss: Treating Ramey Schiff/Extender: Oliver Pila, BRITNEY Weeks in Treatment: 12 Wound Status Wound Number: 8 Primary Venous Leg Ulcer Etiology: Wound Location: Left, Medial Lower Leg Wound Open Wounding Event: Blister Status: Date Acquired: 04/11/2020 Comorbid Glaucoma, Arrhythmia, Hypertension, Peripheral Arterial Disease, Weeks Of Treatment: 12 History: Peripheral Venous Disease, Gout, Osteoarthritis Clustered Wound: Yes Wound Measurements Length: (cm) 0.5 Width: (cm) 0.4 Depth: (cm) 0.1 Clustered Quantity: 1 Area: (cm) 0. Volume: (cm) 0. % Reduction in Area: 99.8% %  Reduction in Volume: 99.8% Epithelialization: Medium (34-66%) Tunneling: No 157 Undermining: No 016 Wound Description Classification: Full Thickness Without Exposed Support Structures Wound Margin: Flat and Intact Exudate Amount: Medium Exudate Type: Serous Exudate Color: amber Foul Odor After Cleansing: No Slough/Fibrino Yes Wound Bed Granulation Amount: Small (1-33%) Exposed Structure Granulation Quality: Pink Fascia Exposed: No Necrotic Amount: Large (67-100%) Fat  Layer (Subcutaneous Tissue) Exposed: Yes Necrotic Quality: Adherent Slough Tendon Exposed: No Muscle Exposed: No Joint Exposed: No Bone Exposed: No Treatment Notes Wound #8 (Left, Medial Lower Leg) 1. Cleanse With Wound Cleanser 2. Periwound Care TCA Cream 3. Primary Dressing Applied Calcium Alginate Ag 4. Secondary Dressing Dry Gauze Roll Gauze 5. Secured With Recruitment consultant) Signed: 08/21/2020 5:45:10 PM By: Carlene Coria RN Signed: 08/21/2020 6:44:49 PM By: Baruch Gouty RN, [name] Entered By: Carlene Coria on 08/21/2020 10:15:27 -------------------------------------------------------------------------------- Wound Assessment Details Patient Name: Date of Service: [name], [name] 08/21/2020 10:00 A M Medical Record Number: [id number] Patient Account Number: [id number] Date of Birth/Sex: Treating RN: 1951/10/27 (68 y.o. Martyn Malay, [name] Primary Care Hafsah Hendler: Jori Moll, Karsten Fells Other Clinician: Referring Yuritza Paulhus: Treating Seydou Hearns/Extender: Oliver Pila, BRITNEY Weeks in Treatment: 12 Wound Status Wound Number: 9 Primary Venous Leg Ulcer Etiology: Wound Location: Left, Posterior Lower Leg Wound Open Wounding Event: Gradually Appeared Status: Date Acquired: 06/05/2020 Comorbid Glaucoma, Arrhythmia, Hypertension, Peripheral Arterial Disease, Weeks Of Treatment: 11 History: Peripheral Venous Disease, Gout, Osteoarthritis Clustered Wound: No Wound Measurements Length: (cm) Width: (cm) Depth: (cm) Clustered Quantity: Area: (cm) Volume: (cm) 0 % Reduction in Area: 100% 0 % Reduction in Volume: 100% 0 Epithelialization: Large (67-100%) 4 Tunneling: No 0 Undermining: No 0 Wound Description Classification: Full Thickness Without Exposed Support Structures Wound Margin: Distinct, outline attached Exudate Amount: None Present Foul Odor After Cleansing: No Slough/Fibrino No Wound Bed Granulation Amount: None Present (0%) Exposed  Structure Necrotic Amount: None Present (0%) Fascia Exposed: No Fat Layer (Subcutaneous Tissue) Exposed: No Tendon Exposed: No Muscle Exposed: No Joint Exposed: No Bone Exposed: No Electronic Signature(s) Signed: 08/21/2020 5:45:10 PM By: Carlene Coria RN Signed: 08/21/2020 6:44:49 PM By: Baruch Gouty RN, [name] Entered By: Carlene Coria on 08/21/2020 10:15:50 -------------------------------------------------------------------------------- Vitals Details Patient Name: Date of Service: [name], [name] 08/21/2020 10:00 A M Medical Record Number: [id number] Patient Account Number: [id number] Date of Birth/Sex: Treating RN: 22-Aug-1951 (69 y.o. [name] Primary Care Aser Nylund: Jori Moll, Karsten Fells Other Clinician: Referring Talib Headley: Treating Chima Astorino/Extender: Oliver Pila, BRITNEY Weeks in Treatment: 12 Vital Signs Time Taken: 10:03 Temperature (F): 97.9 Height (in): 65 Pulse (bpm): 76 Weight (lbs): 144 Respiratory Rate (breaths/min): 16 Body Mass Index (BMI): 24 Blood Pressure (mmHg): 175/75 Reference Range: 80 - 120 mg / dl Electronic Signature(s) Signed: 08/21/2020 11:00:36 AM By: Sandre Kitty Entered By: Sandre Kitty on 08/21/2020 10:03:16
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
## 2020-08-28 ENCOUNTER — Inpatient Hospital Stay: Payer: Medicare Other | Attending: Hematology | Admitting: Hematology

## 2020-08-28 ENCOUNTER — Encounter: Payer: Self-pay | Admitting: Hematology

## 2020-08-28 ENCOUNTER — Inpatient Hospital Stay: Payer: Medicare Other

## 2020-08-28 ENCOUNTER — Other Ambulatory Visit: Payer: Self-pay

## 2020-08-28 VITALS — BP 94/53 | HR 65 | Temp 97.9°F | Resp 18

## 2020-08-28 VITALS — BP 196/74 | HR 77 | Temp 97.7°F | Resp 18 | Ht 65.0 in | Wt 138.1 lb

## 2020-08-28 DIAGNOSIS — D649 Anemia, unspecified: Secondary | ICD-10-CM | POA: Insufficient documentation

## 2020-08-28 DIAGNOSIS — Z86718 Personal history of other venous thrombosis and embolism: Secondary | ICD-10-CM | POA: Insufficient documentation

## 2020-08-28 DIAGNOSIS — I10 Essential (primary) hypertension: Secondary | ICD-10-CM | POA: Insufficient documentation

## 2020-08-28 DIAGNOSIS — Z7952 Long term (current) use of systemic steroids: Secondary | ICD-10-CM | POA: Diagnosis not present

## 2020-08-28 DIAGNOSIS — Z8673 Personal history of transient ischemic attack (TIA), and cerebral infarction without residual deficits: Secondary | ICD-10-CM | POA: Diagnosis not present

## 2020-08-28 DIAGNOSIS — I480 Paroxysmal atrial fibrillation: Secondary | ICD-10-CM | POA: Insufficient documentation

## 2020-08-28 DIAGNOSIS — E785 Hyperlipidemia, unspecified: Secondary | ICD-10-CM | POA: Insufficient documentation

## 2020-08-28 DIAGNOSIS — Z7689 Persons encountering health services in other specified circumstances: Secondary | ICD-10-CM | POA: Diagnosis not present

## 2020-08-28 DIAGNOSIS — Z79899 Other long term (current) drug therapy: Secondary | ICD-10-CM | POA: Insufficient documentation

## 2020-08-28 DIAGNOSIS — K219 Gastro-esophageal reflux disease without esophagitis: Secondary | ICD-10-CM | POA: Diagnosis not present

## 2020-08-28 DIAGNOSIS — Z7901 Long term (current) use of anticoagulants: Secondary | ICD-10-CM | POA: Insufficient documentation

## 2020-08-28 DIAGNOSIS — Z7982 Long term (current) use of aspirin: Secondary | ICD-10-CM | POA: Diagnosis not present

## 2020-08-28 DIAGNOSIS — I252 Old myocardial infarction: Secondary | ICD-10-CM | POA: Diagnosis not present

## 2020-08-28 DIAGNOSIS — I2510 Atherosclerotic heart disease of native coronary artery without angina pectoris: Secondary | ICD-10-CM | POA: Insufficient documentation

## 2020-08-28 DIAGNOSIS — D45 Polycythemia vera: Secondary | ICD-10-CM

## 2020-08-28 DIAGNOSIS — D500 Iron deficiency anemia secondary to blood loss (chronic): Secondary | ICD-10-CM

## 2020-08-28 DIAGNOSIS — D6959 Other secondary thrombocytopenia: Secondary | ICD-10-CM | POA: Diagnosis not present

## 2020-08-28 LAB — CBC WITH DIFFERENTIAL (CANCER CENTER ONLY)
Abs Immature Granulocytes: 0.35 10*3/uL — ABNORMAL HIGH (ref 0.00–0.07)
Basophils Absolute: 0.2 10*3/uL — ABNORMAL HIGH (ref 0.0–0.1)
Basophils Relative: 1 %
Eosinophils Absolute: 0.6 10*3/uL — ABNORMAL HIGH (ref 0.0–0.5)
Eosinophils Relative: 3 %
HCT: 52 % — ABNORMAL HIGH (ref 36.0–46.0)
Hemoglobin: 15.9 g/dL — ABNORMAL HIGH (ref 12.0–15.0)
Immature Granulocytes: 2 %
Lymphocytes Relative: 7 %
Lymphs Abs: 1.4 10*3/uL (ref 0.7–4.0)
MCH: 24.8 pg — ABNORMAL LOW (ref 26.0–34.0)
MCHC: 30.6 g/dL (ref 30.0–36.0)
MCV: 81.3 fL (ref 80.0–100.0)
Monocytes Absolute: 0.6 10*3/uL (ref 0.1–1.0)
Monocytes Relative: 3 %
Neutro Abs: 15.6 10*3/uL — ABNORMAL HIGH (ref 1.7–7.7)
Neutrophils Relative %: 84 %
Platelet Count: 412 10*3/uL — ABNORMAL HIGH (ref 150–400)
RBC: 6.4 MIL/uL — ABNORMAL HIGH (ref 3.87–5.11)
RDW: 21.5 % — ABNORMAL HIGH (ref 11.5–15.5)
WBC Count: 18.7 10*3/uL — ABNORMAL HIGH (ref 4.0–10.5)
nRBC: 0.2 % (ref 0.0–0.2)

## 2020-08-28 LAB — IRON AND TIBC
Iron: 26 ug/dL — ABNORMAL LOW (ref 41–142)
Saturation Ratios: 7 % — ABNORMAL LOW (ref 21–57)
TIBC: 395 ug/dL (ref 236–444)
UIBC: 369 ug/dL (ref 120–384)

## 2020-08-28 LAB — FERRITIN: Ferritin: 36 ng/mL (ref 11–307)

## 2020-08-28 NOTE — Patient Instructions (Signed)

## 2020-08-29 ENCOUNTER — Telehealth: Payer: Self-pay | Admitting: Hematology

## 2020-08-29 NOTE — Telephone Encounter (Signed)
Scheduled appts per 9/8 los. Pt stated she would refer to mychart for appt dates and times.

## 2020-09-02 ENCOUNTER — Other Ambulatory Visit: Payer: Self-pay | Admitting: *Deleted

## 2020-09-02 DIAGNOSIS — M5136 Other intervertebral disc degeneration, lumbar region: Secondary | ICD-10-CM

## 2020-09-04 ENCOUNTER — Ambulatory Visit (INDEPENDENT_AMBULATORY_CARE_PROVIDER_SITE_OTHER): Payer: Medicare Other | Admitting: Podiatry

## 2020-09-04 ENCOUNTER — Other Ambulatory Visit: Payer: Self-pay

## 2020-09-04 ENCOUNTER — Encounter: Payer: Self-pay | Admitting: Podiatry

## 2020-09-04 DIAGNOSIS — B351 Tinea unguium: Secondary | ICD-10-CM

## 2020-09-04 DIAGNOSIS — Z89611 Acquired absence of right leg above knee: Secondary | ICD-10-CM | POA: Diagnosis not present

## 2020-09-04 DIAGNOSIS — Z7689 Persons encountering health services in other specified circumstances: Secondary | ICD-10-CM | POA: Diagnosis not present

## 2020-09-04 DIAGNOSIS — I739 Peripheral vascular disease, unspecified: Secondary | ICD-10-CM | POA: Diagnosis not present

## 2020-09-06 ENCOUNTER — Telehealth: Payer: Self-pay | Admitting: Cardiovascular Disease

## 2020-09-06 NOTE — Telephone Encounter (Signed)
Per last OV with Dr. [name]: Hyperlipidemia History of hyperlipidemia intolerant to statin therapy on Zetia and fenofibrate.  We will recheck a lipid liver profile today  Last Lipid panel 06/28/20-LDL 88 Upcoming appt 9/28 with Dr. Stann Mainland discuss further at that time.

## 2020-09-06 NOTE — Telephone Encounter (Signed)
April, from [employer] called because they see she is a cardiovascular patient and she not on any moderate to high statin medication according to the 2020 AHA guidelines.  They are wondering if Dr. [name] would like to review her medications.
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
## 2020-09-09 ENCOUNTER — Other Ambulatory Visit: Payer: Self-pay | Admitting: *Deleted

## 2020-09-09 DIAGNOSIS — M5136 Other intervertebral disc degeneration, lumbar region: Secondary | ICD-10-CM

## 2020-09-10 NOTE — Progress Notes (Signed)
CLYDIE, DILLEN ([id number]) Visit Report for 07/10/2020 Arrival Information Details Patient Name: Date of Service: [place], [name] 07/10/2020 10:45 A M Medical Record Number: [id number] Patient Account Number: [id number] Date of Birth/Sex: Treating RN: April 09, 1951 (69 y.o. [name] Primary Care Astin Rape: Jori Moll, Karsten Fells Other Clinician: Referring Alger Kerstein: Treating Gusta Marksberry/Extender: Oliver Pila, BRITNEY Weeks in Treatment: 6 Visit Information History Since Last Visit Added or deleted any medications: No Patient Arrived: Walker Any new allergies or adverse reactions: No Arrival Time: 11:26 Had a fall or experienced change in No Accompanied By: self activities of daily living that may affect Transfer Assistance: None risk of falls: Patient Identification Verified: Yes Signs or symptoms of abuse/neglect since last visito No Secondary Verification Process Completed: Yes Hospitalized since last visit: No Patient Requires Transmission-Based Precautions: No Implantable device outside of the clinic excluding No Patient Has Alerts: Yes cellular tissue based products placed in the center Patient Alerts: Patient on Blood Thinner since last visit: L ABI non compressible Has Dressing in Place as Prescribed: Yes Pain Present Now: No Electronic Signature(s) Signed: 09/10/2020 8:09:04 AM By: Sandre Kitty Entered By: Sandre Kitty on 07/10/2020 11:29:09 -------------------------------------------------------------------------------- Clinic Level of Care Assessment Details Patient Name: Date of Service: [place], [name] 07/10/2020 10:45 A M Medical Record Number: [id number] Patient Account Number: [id number] Date of Birth/Sex: Treating RN: 17-May-1951 (69 y.o. [name] Primary Care Mckenzey Parcell: Jori Moll, Karsten Fells Other Clinician: Referring Ayyan Sites: Treating Claudell Wohler/Extender: Oliver Pila, BRITNEY Weeks in Treatment: 6 Clinic Level of Care  Assessment Items TOOL 4 Quantity Score []  - 0 Use when only an EandM is performed on FOLLOW-UP visit ASSESSMENTS - Nursing Assessment / Reassessment X- 1 10 Reassessment of Co-morbidities (includes updates in patient status) X- 1 5 Reassessment of Adherence to Treatment Plan ASSESSMENTS - Wound and Skin A ssessment / Reassessment []  - 0 Simple Wound Assessment / Reassessment - one wound X- 2 5 Complex Wound Assessment / Reassessment - multiple wounds []  - 0 Dermatologic / Skin Assessment (not related to wound area) ASSESSMENTS - Focused Assessment X- 1 5 Circumferential Edema Measurements - multi extremities []  - 0 Nutritional Assessment / Counseling / Intervention X- 1 5 Lower Extremity Assessment (monofilament, tuning fork, pulses) []  - 0 Peripheral Arterial Disease Assessment (using hand held doppler) ASSESSMENTS - Ostomy and/or Continence Assessment and Care []  - 0 Incontinence Assessment and Management []  - 0 Ostomy Care Assessment and Management (repouching, etc.) PROCESS - Coordination of Care X - Simple Patient / Family Education for ongoing care 1 15 []  - 0 Complex (extensive) Patient / Family Education for ongoing care X- 1 10 Staff obtains Programmer, systems, Records, T Results / Process Orders est []  - 0 Staff telephones HHA, Nursing Homes / Clarify orders / etc []  - 0 Routine Transfer to another Facility (non-emergent condition) []  - 0 Routine Hospital Admission (non-emergent condition) []  - 0 New Admissions / Biomedical engineer / Ordering NPWT Apligraf, etc. , []  - 0 Emergency Hospital Admission (emergent condition) X- 1 10 Simple Discharge Coordination []  - 0 Complex (extensive) Discharge Coordination PROCESS - Special Needs []  - 0 Pediatric / Minor Patient Management []  - 0 Isolation Patient Management []  - 0 Hearing / Language / Visual special needs []  - 0 Assessment of Community assistance (transportation, D/C planning, etc.) []  -  0 Additional assistance / Altered mentation []  - 0 Support Surface(s) Assessment (bed, cushion, seat, etc.) INTERVENTIONS - Wound Cleansing / Measurement []  -  0 Simple Wound Cleansing - one wound X- 2 5 Complex Wound Cleansing - multiple wounds X- 1 5 Wound Imaging (photographs - any number of wounds) []  - 0 Wound Tracing (instead of photographs) []  - 0 Simple Wound Measurement - one wound X- 2 5 Complex Wound Measurement - multiple wounds INTERVENTIONS - Wound Dressings X - Small Wound Dressing one or multiple wounds 2 10 []  - 0 Medium Wound Dressing one or multiple wounds []  - 0 Large Wound Dressing one or multiple wounds X- 1 5 Application of Medications - topical []  - 0 Application of Medications - injection INTERVENTIONS - Miscellaneous []  - 0 External ear exam []  - 0 Specimen Collection (cultures, biopsies, blood, body fluids, etc.) []  - 0 Specimen(s) / Culture(s) sent or taken to Lab for analysis []  - 0 Patient Transfer (multiple staff / Civil Service fast streamer / Similar devices) []  - 0 Simple Staple / Suture removal (25 or less) []  - 0 Complex Staple / Suture removal (26 or more) []  - 0 Hypo / Hyperglycemic Management (close monitor of Blood Glucose) []  - 0 Ankle / Brachial Index (ABI) - do not check if billed separately X- 1 5 Vital Signs Has the patient been seen at the hospital within the last three years: Yes Total Score: 125 Level Of Care: New/Established - Level 4 Electronic Signature(s) Signed: 07/11/2020 4:56:58 PM By: Baruch Gouty RN, [name] Entered By: Baruch Gouty on 07/10/2020 12:00:07 -------------------------------------------------------------------------------- Encounter Discharge Information Details Patient Name: Date of Service: [name], [name] 07/10/2020 10:45 A M Medical Record Number: [id number] Patient Account Number: [id number] Date of Birth/Sex: Treating RN: 09-13-1951 (69 y.o. [name] Primary Care Danecia Underdown: Jori Moll, Karsten Fells  Other Clinician: Referring Courtnay Petrilla: Treating Jack Mineau/Extender: Baron Hamper Weeks in Treatment: 6 Encounter Discharge Information Items Discharge Condition: Stable Ambulatory Status: Walker Discharge Destination: Home Transportation: Private Auto Accompanied By: self Schedule Follow-up Appointment: Yes Clinical Summary of Care: Patient Declined Electronic Signature(s) Signed: 07/11/2020 4:56:58 PM By: Baruch Gouty RN, [name] Entered By: Baruch Gouty on 07/10/2020 12:11:27 -------------------------------------------------------------------------------- Lower Extremity Assessment Details Patient Name: Date of Service: [place], [name] 07/10/2020 10:45 A M Medical Record Number: [id number] Patient Account Number: [id number] Date of Birth/Sex: Treating RN: 1951-08-22 (69 y.o. [name] Primary Care Devrin Monforte: Jori Moll, Karsten Fells Other Clinician: Referring Tron Flythe: Treating Female Minish/Extender: Oliver Pila, BRITNEY Weeks in Treatment: 6 Edema Assessment Assessed: [Left: No] [Right: No] Edema: [Left: Ye] [Right: s] Calf Left: Right: Point of Measurement: 28 cm From Medial Instep 34 cm cm Ankle Left: Right: Point of Measurement: 9 cm From Medial Instep 19.5 cm cm Vascular Assessment Pulses: Dorsalis Pedis Palpable: [Left:No] Electronic Signature(s) Signed: 07/11/2020 4:56:58 PM By: Baruch Gouty RN, [name] Entered By: Baruch Gouty on 07/10/2020 11:45:26 -------------------------------------------------------------------------------- Multi-Disciplinary Care Plan Details Patient Name: Date of Service: [name], [name] 07/10/2020 10:45 A M Medical Record Number: [id number] Patient Account Number: [id number] Date of Birth/Sex: Treating RN: 1951-08-31 (69 y.o. [name] Primary Care Ameya Kutz: Jori Moll, Karsten Fells Other Clinician: Referring Delwin Raczkowski: Treating Glenden Rossell/Extender: Oliver Pila, BRITNEY Weeks in Treatment:  6 Active Inactive Pain, Acute or Chronic Nursing Diagnoses: Pain, acute or chronic: actual or potential Potential alteration in comfort, pain Goals: Patient will verbalize adequate pain control and receive pain control interventions during procedures as needed Date Initiated: 05/29/2020 Target Resolution Date: 07/24/2020 Goal Status: Active Interventions: Provide education on pain management Reposition patient for comfort Treatment Activities: Administer pain  control measures as ordered : 05/29/2020 Notes: Wound/Skin Impairment Nursing Diagnoses: Knowledge deficit related to ulceration/compromised skin integrity Goals: Patient/caregiver will verbalize understanding of skin care regimen Date Initiated: 05/29/2020 Target Resolution Date: 07/24/2020 Goal Status: Active Interventions: Assess patient/caregiver ability to perform ulcer/skin care regimen upon admission and as needed Assess ulceration(s) every visit Provide education on ulcer and skin care Treatment Activities: Skin care regimen initiated : 05/29/2020 Topical wound management initiated : 05/29/2020 Notes: Electronic Signature(s) Signed: 07/11/2020 4:56:58 PM By: Baruch Gouty RN, [name] Entered By: Baruch Gouty on 07/10/2020 11:58:32 -------------------------------------------------------------------------------- Pain Assessment Details Patient Name: Date of Service: [name], [name] 07/10/2020 10:45 A M Medical Record Number: [id number] Patient Account Number: [id number] Date of Birth/Sex: Treating RN: [birth year] (69 y.o. [name] Primary Care Tahari Clabaugh: Jori Moll, Karsten Fells Other Clinician: Referring Vallen Calabrese: Treating Leannah Guse/Extender: Oliver Pila, BRITNEY Weeks in Treatment: 6 Active Problems Location of Pain Severity and Description of Pain Patient Has Paino No Site Locations Pain Management and Medication Current Pain Management: Electronic Signature(s) Signed: 07/11/2020 4:56:58 PM By:  Baruch Gouty RN, [name] Signed: 09/10/2020 8:09:04 AM By: Sandre Kitty Entered By: Sandre Kitty on 07/10/2020 11:29:31 -------------------------------------------------------------------------------- Patient/Caregiver Education Details Patient Name: Date of Service: Marguerita [place], [name] 7/21/2021andnbsp10:45 A M Medical Record Number: [id number] Patient Account Number: [id number] Date of Birth/Gender: Treating RN: 1951/11/12 (69 y.o. [name] Primary Care Physician: Jori Moll, Karsten Fells Other Clinician: Referring Physician: Treating Physician/Extender: Baron Hamper Weeks in Treatment: 6 Education Assessment Education Provided To: Patient Education Topics Provided Wound/Skin Impairment: Methods: Explain/Verbal Responses: Reinforcements needed, State content correctly Electronic Signature(s) Signed: 07/11/2020 4:56:58 PM By: Baruch Gouty RN, [name] Entered By: Baruch Gouty on 07/10/2020 11:59:21 -------------------------------------------------------------------------------- Wound Assessment Details Patient Name: Date of Service: [name], [name] 07/10/2020 10:45 A M Medical Record Number: [id number] Patient Account Number: [id number] Date of Birth/Sex: Treating RN: 1951-07-16 (69 y.o. Martyn Malay, [name] Primary Care Russell Engelstad: Jori Moll, Karsten Fells Other Clinician: Referring Laneshia Pina: Treating Nikolina Simerson/Extender: Oliver Pila, BRITNEY Weeks in Treatment: 6 Wound Status Wound Number: 7 Primary Venous Leg Ulcer Etiology: Wound Location: Left, Anterior Lower Leg Wound Healed - Epithelialized Wounding Event: Blister Status: Date Acquired: 04/11/2020 Comorbid Glaucoma, Arrhythmia, Hypertension, Peripheral Arterial Disease, Weeks Of Treatment: 6 History: Peripheral Venous Disease, Gout, Osteoarthritis Clustered Wound: No Photos Photo Uploaded By: Mikeal Hawthorne on 07/10/2020 13:20:40 Wound Measurements Length: (cm) Width: (cm) Depth:  (cm) Area: (cm) Volume: (cm) 0 % Reduction in Area: 100% 0 % Reduction in Volume: 100% 0 Epithelialization: Large (67-100%) 0 Tunneling: No 0 Undermining: No Wound Description Classification: Full Thickness Without Exposed Support Structures Wound Margin: Flat and Intact Exudate Amount: None Present Foul Odor After Cleansing: No Slough/Fibrino No Wound Bed Granulation Amount: None Present (0%) Exposed Structure Necrotic Amount: None Present (0%) Fascia Exposed: No Fat Layer (Subcutaneous Tissue) Exposed: No Tendon Exposed: No Muscle Exposed: No Joint Exposed: No Bone Exposed: No Electronic Signature(s) Signed: 07/11/2020 4:56:58 PM By: Baruch Gouty RN, [name] Entered By: Baruch Gouty on 07/10/2020 11:45:53 -------------------------------------------------------------------------------- Wound Assessment Details Patient Name: Date of Service: [name], [name] 07/10/2020 10:45 A M Medical Record Number: [id number] Patient Account Number: [id number] Date of Birth/Sex: Treating RN: [birth year] (69 y.o. [name] Primary Care Marieme Mcmackin: Jori Moll, Karsten Fells Other Clinician: Referring Bekki Tavenner: Treating Schwanda Zima/Extender: Oliver Pila, BRITNEY Weeks in Treatment: 6 Wound Status Wound Number: 8 Primary Venous Leg Ulcer Etiology: Wound Location: Left, Medial Lower  Leg Wound Open Wounding Event: Blister Status: Date Acquired: 04/11/2020 Comorbid Glaucoma, Arrhythmia, Hypertension, Peripheral Arterial Disease, Weeks Of Treatment: 6 History: Peripheral Venous Disease, Gout, Osteoarthritis Clustered Wound: Yes Photos Photo Uploaded By: Mikeal Hawthorne on 07/10/2020 13:20:17 Wound Measurements Length: (cm) 0.5 Width: (cm) 0.6 Depth: (cm) 0.1 Clustered Quantity: 1 Area: (cm) 0.236 Volume: (cm) 0.024 % Reduction in Area: 99.7% % Reduction in Volume: 99.7% Epithelialization: Small (1-33%) Tunneling: No Undermining: No Wound Description Classification:  Full Thickness Without Exposed Support Structures Wound Margin: Flat and Intact Exudate Amount: Small Exudate Type: Serous Exudate Color: amber Foul Odor After Cleansing: No Slough/Fibrino Yes Wound Bed Granulation Amount: Small (1-33%) Exposed Structure Necrotic Amount: Large (67-100%) Fascia Exposed: No Necrotic Quality: Adherent Slough Fat Layer (Subcutaneous Tissue) Exposed: Yes Tendon Exposed: No Muscle Exposed: No Joint Exposed: No Bone Exposed: No Electronic Signature(s) Signed: 07/11/2020 4:56:58 PM By: Baruch Gouty RN, [name] Entered By: Baruch Gouty on 07/10/2020 11:47:17 -------------------------------------------------------------------------------- Wound Assessment Details Patient Name: Date of Service: [name], [name] 07/10/2020 10:45 A M Medical Record Number: [id number] Patient Account Number: [id number] Date of Birth/Sex: Treating RN: March 21, 1951 (69 y.o. [name] Primary Care Eesha Schmaltz: Jori Moll, Karsten Fells Other Clinician: Referring Ryszard Socarras: Treating Eashan Schipani/Extender: Oliver Pila, BRITNEY Weeks in Treatment: 6 Wound Status Wound Number: 9 Primary Venous Leg Ulcer Etiology: Wound Location: Left, Posterior Lower Leg Wound Open Wounding Event: Gradually Appeared Status: Date Acquired: 06/05/2020 Comorbid Glaucoma, Arrhythmia, Hypertension, Peripheral Arterial Disease, Weeks Of Treatment: 5 History: Peripheral Venous Disease, Gout, Osteoarthritis Clustered Wound: Yes Photos Photo Uploaded By: Mikeal Hawthorne on 07/10/2020 13:20:18 Wound Measurements Length: (cm) 11 Width: (cm) 4 Depth: (cm) 0.1 Clustered Quantity: 4 Area: (cm) 34.558 Volume: (cm) 3.456 % Reduction in Area: -349% % Reduction in Volume: -348.8% Epithelialization: Large (67-100%) Tunneling: No Undermining: No Wound Description Classification: Full Thickness Without Exposed Support Structures Wound Margin: Distinct, outline attached Exudate Amount:  Medium Exudate Type: Serosanguineous Exudate Color: red, brown Foul Odor After Cleansing: No Slough/Fibrino Yes Wound Bed Granulation Amount: Small (1-33%) Exposed Structure Granulation Quality: Pink, Pale Fascia Exposed: No Necrotic Amount: Large (67-100%) Fat Layer (Subcutaneous Tissue) Exposed: Yes Necrotic Quality: Adherent Slough Tendon Exposed: No Muscle Exposed: No Joint Exposed: No Bone Exposed: No Electronic Signature(s) Signed: 07/11/2020 4:56:58 PM By: Baruch Gouty RN, [name] Entered By: Baruch Gouty on 07/10/2020 11:47:49 -------------------------------------------------------------------------------- [place] Details Patient Name: Date of Service: [name], [name] 07/10/2020 10:45 A M Medical Record Number: [id number] Patient Account Number: [id number] Date of Birth/Sex: Treating RN: [birth year] (69 y.o. [name] Primary Care Igor Bishop: Jori Moll, Karsten Fells Other Clinician: Referring Payzlee Ryder: Treating Sirena Riddle/Extender: Oliver Pila, BRITNEY Weeks in Treatment: 6 Vital Signs Time Taken: 11:29 Temperature (F): 98.3 Height (in): 65 Pulse (bpm): 68 Weight (lbs): 144 Respiratory Rate (breaths/min): 16 Body Mass Index (BMI): 24 Blood Pressure (mmHg): 183/63 Reference Range: 80 - 120 mg / dl Electronic Signature(s) Signed: 09/10/2020 8:09:04 AM By: Sandre Kitty Entered By: Sandre Kitty on 07/10/2020 11:29:26

## 2020-09-11 ENCOUNTER — Other Ambulatory Visit: Payer: Self-pay

## 2020-09-11 ENCOUNTER — Encounter (HOSPITAL_BASED_OUTPATIENT_CLINIC_OR_DEPARTMENT_OTHER): Payer: Medicare Other | Admitting: Physician Assistant

## 2020-09-11 DIAGNOSIS — Z7901 Long term (current) use of anticoagulants: Secondary | ICD-10-CM | POA: Diagnosis not present

## 2020-09-11 DIAGNOSIS — Z89511 Acquired absence of right leg below knee: Secondary | ICD-10-CM | POA: Diagnosis not present

## 2020-09-11 DIAGNOSIS — L97822 Non-pressure chronic ulcer of other part of left lower leg with fat layer exposed: Secondary | ICD-10-CM | POA: Diagnosis not present

## 2020-09-11 DIAGNOSIS — S81802A Unspecified open wound, left lower leg, initial encounter: Secondary | ICD-10-CM | POA: Diagnosis not present

## 2020-09-11 DIAGNOSIS — I480 Paroxysmal atrial fibrillation: Secondary | ICD-10-CM | POA: Diagnosis not present

## 2020-09-11 DIAGNOSIS — M1990 Unspecified osteoarthritis, unspecified site: Secondary | ICD-10-CM | POA: Diagnosis not present

## 2020-09-11 DIAGNOSIS — H409 Unspecified glaucoma: Secondary | ICD-10-CM | POA: Diagnosis not present

## 2020-09-11 DIAGNOSIS — Z89611 Acquired absence of right leg above knee: Secondary | ICD-10-CM | POA: Diagnosis not present

## 2020-09-11 DIAGNOSIS — I10 Essential (primary) hypertension: Secondary | ICD-10-CM | POA: Diagnosis not present

## 2020-09-11 DIAGNOSIS — Z7689 Persons encountering health services in other specified circumstances: Secondary | ICD-10-CM | POA: Diagnosis not present

## 2020-09-11 DIAGNOSIS — Z8673 Personal history of transient ischemic attack (TIA), and cerebral infarction without residual deficits: Secondary | ICD-10-CM | POA: Diagnosis not present

## 2020-09-11 DIAGNOSIS — M109 Gout, unspecified: Secondary | ICD-10-CM | POA: Diagnosis not present

## 2020-09-11 DIAGNOSIS — I872 Venous insufficiency (chronic) (peripheral): Secondary | ICD-10-CM | POA: Diagnosis not present

## 2020-09-11 DIAGNOSIS — I739 Peripheral vascular disease, unspecified: Secondary | ICD-10-CM | POA: Diagnosis not present
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
## 2020-09-13 NOTE — Progress Notes (Signed)
TANIEKA, [name] ([id number]) Visit Report for 09/11/2020 Arrival Information Details Patient Name: Date of Service: [place], [name] 09/11/2020 10:00 A M Medical Record Number: [id number] Patient Account Number: [id number] Date of Birth/Sex: Treating RN: Jan 08, 1951 (69 y.o. Helene Shoe, Meta.Reding Primary Care Itzel Mckibbin: Jori Moll, Karsten Fells Other Clinician: Referring Renly Roots: Treating Ruffin Lada/Extender: Baron Hamper Weeks in Treatment: 15 Visit Information History Since Last Visit Added or deleted any medications: No Patient Arrived: Walker Any new allergies or adverse reactions: No Arrival Time: 09:38 Had a fall or experienced change in No Accompanied By: self activities of daily living that may affect Transfer Assistance: None risk of falls: Patient Identification Verified: Yes Signs or symptoms of abuse/neglect since last visito No Secondary Verification Process Completed: Yes Hospitalized since last visit: No Patient Requires Transmission-Based Precautions: No Implantable device outside of the clinic excluding No Patient Has Alerts: Yes cellular tissue based products placed in the center Patient Alerts: Patient on Blood Thinner since last visit: L ABI non compressible Has Dressing in Place as Prescribed: Yes Pain Present Now: No Electronic Signature(s) Signed: 09/11/2020 5:01:59 PM By: Deon Pilling Entered By: Deon Pilling on 09/11/2020 09:43:20 -------------------------------------------------------------------------------- Clinic Level of Care Assessment Details Patient Name: Date of Service: [place], [name] 09/11/2020 10:00 A M Medical Record Number: [id number] Patient Account Number: [id number] Date of Birth/Sex: Treating RN: 1951-08-18 (69 y.o. [name] Primary Care Vola Beneke: Jori Moll, Karsten Fells Other Clinician: Referring Sharvi Mooneyhan: Treating Mailee Klaas/Extender: Oliver Pila, BRITNEY Weeks in Treatment: 15 Clinic Level of Care Assessment  Items TOOL 4 Quantity Score []  - 0 Use when only an EandM is performed on FOLLOW-UP visit ASSESSMENTS - Nursing Assessment / Reassessment X- 1 10 Reassessment of Co-morbidities (includes updates in patient status) X- 1 5 Reassessment of Adherence to Treatment Plan ASSESSMENTS - Wound and Skin A ssessment / Reassessment []  - 0 Simple Wound Assessment / Reassessment - one wound X- 2 5 Complex Wound Assessment / Reassessment - multiple wounds []  - 0 Dermatologic / Skin Assessment (not related to wound area) ASSESSMENTS - Focused Assessment X- 1 5 Circumferential Edema Measurements - multi extremities []  - 0 Nutritional Assessment / Counseling / Intervention X- 1 5 Lower Extremity Assessment (monofilament, tuning fork, pulses) []  - 0 Peripheral Arterial Disease Assessment (using hand held doppler) ASSESSMENTS - Ostomy and/or Continence Assessment and Care []  - 0 Incontinence Assessment and Management []  - 0 Ostomy Care Assessment and Management (repouching, etc.) PROCESS - Coordination of Care X - Simple Patient / Family Education for ongoing care 1 15 []  - 0 Complex (extensive) Patient / Family Education for ongoing care X- 1 10 Staff obtains Programmer, systems, Records, T Results / Process Orders est []  - 0 Staff telephones HHA, Nursing Homes / Clarify orders / etc []  - 0 Routine Transfer to another Facility (non-emergent condition) []  - 0 Routine Hospital Admission (non-emergent condition) []  - 0 New Admissions / Biomedical engineer / Ordering NPWT Apligraf, etc. , []  - 0 Emergency Hospital Admission (emergent condition) X- 1 10 Simple Discharge Coordination []  - 0 Complex (extensive) Discharge Coordination PROCESS - Special Needs []  - 0 Pediatric / Minor Patient Management []  - 0 Isolation Patient Management []  - 0 Hearing / Language / Visual special needs []  - 0 Assessment of Community assistance (transportation, D/C planning, etc.) []  - 0 Additional  assistance / Altered mentation []  - 0 Support Surface(s) Assessment (bed, cushion, seat, etc.) INTERVENTIONS - Wound Cleansing / Measurement []  -  0 Simple Wound Cleansing - one wound X- 2 5 Complex Wound Cleansing - multiple wounds X- 1 5 Wound Imaging (photographs - any number of wounds) []  - 0 Wound Tracing (instead of photographs) []  - 0 Simple Wound Measurement - one wound X- 2 5 Complex Wound Measurement - multiple wounds INTERVENTIONS - Wound Dressings X - Small Wound Dressing one or multiple wounds 2 10 []  - 0 Medium Wound Dressing one or multiple wounds []  - 0 Large Wound Dressing one or multiple wounds X- 1 5 Application of Medications - topical []  - 0 Application of Medications - injection INTERVENTIONS - Miscellaneous []  - 0 External ear exam []  - 0 Specimen Collection (cultures, biopsies, blood, body fluids, etc.) []  - 0 Specimen(s) / Culture(s) sent or taken to Lab for analysis []  - 0 Patient Transfer (multiple staff / Civil Service fast streamer / Similar devices) []  - 0 Simple Staple / Suture removal (25 or less) []  - 0 Complex Staple / Suture removal (26 or more) []  - 0 Hypo / Hyperglycemic Management (close monitor of Blood Glucose) []  - 0 Ankle / Brachial Index (ABI) - do not check if billed separately X- 1 5 Vital Signs Has the patient been seen at the hospital within the last three years: Yes Total Score: 125 Level Of Care: New/Established - Level 4 Electronic Signature(s) Signed: 09/11/2020 5:11:27 PM By: Baruch Gouty RN, [name] Entered By: Baruch Gouty on 09/11/2020 11:05:55 -------------------------------------------------------------------------------- Encounter Discharge Information Details Patient Name: Date of Service: [name], [name] 09/11/2020 10:00 A M Medical Record Number: [id number] Patient Account Number: [id number] Date of Birth/Sex: Treating RN: [birth year] (69 y.o. [name] Primary Care Gaspard Isbell: Jori Moll, Karsten Fells Other  Clinician: Referring Tashiba Timoney: Treating Jermayne Sweeney/Extender: Baron Hamper Weeks in Treatment: 15 Encounter Discharge Information Items Discharge Condition: Stable Ambulatory Status: Walker Discharge Destination: Home Transportation: Private Auto Accompanied By: self Schedule Follow-up Appointment: Yes Clinical Summary of Care: Patient Declined Electronic Signature(s) Signed: 09/13/2020 4:51:09 PM By: Carlene Coria RN Entered By: Carlene Coria on 09/11/2020 11:34:34 -------------------------------------------------------------------------------- Lower Extremity Assessment Details Patient Name: Date of Service: [place], [name] 09/11/2020 10:00 A M Medical Record Number: [id number] Patient Account Number: [id number] Date of Birth/Sex: Treating RN: 22-Jul-1951 (69 y.o. Helene Shoe, Meta.Reding Primary Care Cathrine Krizan: Jori Moll, Karsten Fells Other Clinician: Referring Lillyonna Armstead: Treating Averyana Pillars/Extender: Oliver Pila, BRITNEY Weeks in Treatment: 15 Edema Assessment Assessed: [Left: Yes] [Right: No] Edema: [Left: Ye] [Right: s] Calf Left: Right: Point of Measurement: 28 cm From Medial Instep 32 cm cm Ankle Left: Right: Point of Measurement: 9 cm From Medial Instep 19 cm cm Electronic Signature(s) Signed: 09/11/2020 5:01:59 PM By: Deon Pilling Entered By: Deon Pilling on 09/11/2020 09:43:58 -------------------------------------------------------------------------------- Multi-Disciplinary Care Plan Details Patient Name: Date of Service: [name], [name] 09/11/2020 10:00 A M Medical Record Number: [id number] Patient Account Number: [id number] Date of Birth/Sex: Treating RN: 07/12/1951 (69 y.o. [name] Primary Care Allyse Fregeau: Jori Moll, Karsten Fells Other Clinician: Referring Tayvon Culley: Treating Breckyn Troyer/Extender: Oliver Pila, BRITNEY Weeks in Treatment: 15 Active Inactive Wound/Skin Impairment Nursing Diagnoses: Knowledge deficit related to  ulceration/compromised skin integrity Goals: Patient/caregiver will verbalize understanding of skin care regimen Date Initiated: 05/29/2020 Target Resolution Date: 09/18/2020 Goal Status: Active Interventions: Assess patient/caregiver ability to perform ulcer/skin care regimen upon admission and as needed Assess ulceration(s) every visit Provide education on ulcer and skin care Treatment Activities: Skin care regimen initiated : 05/29/2020 Topical wound management initiated :  05/29/2020 Notes: Electronic Signature(s) Signed: 09/11/2020 5:11:27 PM By: Baruch Gouty RN, [name] Entered By: Baruch Gouty on 09/11/2020 11:01:29 -------------------------------------------------------------------------------- Pain Assessment Details Patient Name: Date of Service: [name], [name] 09/11/2020 10:00 A M Medical Record Number: [id number] Patient Account Number: [id number] Date of Birth/Sex: Treating RN: 1951-01-29 (69 y.o. Debby Bud Primary Care Brindle Leyba: Jori Moll, Karsten Fells Other Clinician: Referring Buffy Ehler: Treating Dameion Briles/Extender: Oliver Pila, BRITNEY Weeks in Treatment: 15 Active Problems Location of Pain Severity and Description of Pain Patient Has Paino No Site Locations Rate the pain. Rate the pain. Current Pain Level: 0 Pain Management and Medication Current Pain Management: Medication: No Cold Application: No Rest: No Massage: No Activity: No T.E.N.S.: No Heat Application: No Leg drop or elevation: No Is the Current Pain Management Adequate: Adequate How does your wound impact your activities of daily livingo Sleep: No Bathing: No Appetite: No Relationship With Others: No Bladder Continence: No Emotions: No Bowel Continence: No Work: No Toileting: No Drive: No Dressing: No Hobbies: No Electronic Signature(s) Signed: 09/11/2020 5:01:59 PM By: Deon Pilling Entered By: Deon Pilling on 09/11/2020  09:43:50 -------------------------------------------------------------------------------- Patient/Caregiver Education Details Patient Name: Date of Service: Marguerita [place], [name] 9/22/2021andnbsp10:00 A M Medical Record Number: [id number] Patient Account Number: [id number] Date of Birth/Gender: Treating RN: 1951/08/06 (69 y.o. [name] Primary Care Physician: Jori Moll, Karsten Fells Other Clinician: Referring Physician: Treating Physician/Extender: Baron Hamper Weeks in Treatment: 15 Education Assessment Education Provided To: Patient Education Topics Provided Wound/Skin Impairment: Methods: Explain/Verbal Responses: Reinforcements needed, State content correctly Electronic Signature(s) Signed: 09/11/2020 5:11:27 PM By: Baruch Gouty RN, [name] Entered By: Baruch Gouty on 09/11/2020 11:02:03 -------------------------------------------------------------------------------- Wound Assessment Details Patient Name: Date of Service: [name], [name] 09/11/2020 10:00 A M Medical Record Number: [id number] Patient Account Number: [id number] Date of Birth/Sex: Treating RN: 1951/03/09 (69 y.o. Helene Shoe, Meta.Reding Primary Care Gazella Anglin: Jori Moll, Karsten Fells Other Clinician: Referring Briggitte Boline: Treating Tonia Avino/Extender: Oliver Pila, BRITNEY Weeks in Treatment: 15 Wound Status Wound Number: 10 Primary Arterial Insufficiency Ulcer Etiology: Wound Location: Left, Lateral Lower Leg Wound Open Wounding Event: Gradually Appeared Status: Date Acquired: 07/24/2020 Comorbid Glaucoma, Arrhythmia, Hypertension, Peripheral Arterial Disease, Weeks Of Treatment: 7 History: Peripheral Venous Disease, Gout, Osteoarthritis Clustered Wound: No Photos Photo Uploaded By: Mikeal Hawthorne on 09/12/2020 11:34:16 Wound Measurements Length: (cm) 0.8 Width: (cm) 0.5 Depth: (cm) 0.2 Area: (cm) 0.314 Volume: (cm) 0.063 % Reduction in Area: -25.1% % Reduction in Volume:  -152% Epithelialization: None Tunneling: No Undermining: No Wound Description Classification: Full Thickness Without Exposed Support Structures Wound Margin: Flat and Intact Exudate Amount: Medium Exudate Type: Serosanguineous Exudate Color: red, brown Foul Odor After Cleansing: No Slough/Fibrino Yes Wound Bed Granulation Amount: Small (1-33%) Exposed Structure Granulation Quality: Pink Fascia Exposed: No Necrotic Amount: Large (67-100%) Fat Layer (Subcutaneous Tissue) Exposed: Yes Necrotic Quality: Adherent Slough Tendon Exposed: No Muscle Exposed: No Joint Exposed: No Bone Exposed: No Treatment Notes Wound #10 (Left, Lateral Lower Leg) 1. Cleanse With Wound Cleanser 2. Periwound Care TCA Ointment Other periwound care (specifiy in notes) 3. Primary Dressing Applied Calcium Alginate Ag Other primary dressing (specifiy in notes) 4. Secondary Dressing Dry Gauze Roll Gauze 5. Secured With Tape Notes Scientist, water quality) Signed: 09/11/2020 5:01:59 PM By: Deon Pilling Entered By: Deon Pilling on 09/11/2020 09:44:38 -------------------------------------------------------------------------------- Wound Assessment Details Patient Name: Date of Service: [name], [name] 09/11/2020 10:00 A M Medical Record Number: [id number] Patient Account Number:  [id number] Date of Birth/Sex: Treating RN: 1951/08/10 (69 y.o. Helene Shoe, Meta.Reding Primary Care Lindyn Vossler: Jori Moll, Karsten Fells Other Clinician: Referring Jerrol Helmers: Treating Abigail Marsiglia/Extender: Oliver Pila, BRITNEY Weeks in Treatment: 15 Wound Status Wound Number: 8 Primary Venous Leg Ulcer Etiology: Wound Location: Left, Medial Lower Leg Wound Open Wounding Event: Blister Status: Date Acquired: 04/11/2020 Comorbid Glaucoma, Arrhythmia, Hypertension, Peripheral Arterial Disease, Weeks Of Treatment: 15 History: Peripheral Venous Disease, Gout, Osteoarthritis Clustered Wound: Yes Photos Photo Uploaded  By: Mikeal Hawthorne on 09/12/2020 11:34:29 Wound Measurements Length: (cm) 0.6 Width: (cm) 0.4 Depth: (cm) 0.2 Clustered Quantity: 1 Area: (cm) 0.188 Volume: (cm) 0.038 % Reduction in Area: 99.7% % Reduction in Volume: 99.5% Epithelialization: Small (1-33%) Tunneling: No Undermining: No Wound Description Classification: Full Thickness Without Exposed Support Structures Wound Margin: Flat and Intact Exudate Amount: Medium Exudate Type: Serous Exudate Color: amber Foul Odor After Cleansing: No Slough/Fibrino Yes Wound Bed Granulation Amount: None Present (0%) Exposed Structure Necrotic Amount: Large (67-100%) Fascia Exposed: No Necrotic Quality: Adherent Slough Fat Layer (Subcutaneous Tissue) Exposed: Yes Tendon Exposed: No Muscle Exposed: No Joint Exposed: No Bone Exposed: No Treatment Notes Wound #8 (Left, Medial Lower Leg) 1. Cleanse With Wound Cleanser 2. Periwound Care Moisturizing lotion TCA Cream 3. Primary Dressing Applied Calcium Alginate Ag Other primary dressing (specifiy in notes) 4. Secondary Dressing Dry Gauze Roll Gauze 5. Secured With Tape Notes Scientist, water quality) Signed: 09/11/2020 5:01:59 PM By: Deon Pilling Entered By: Deon Pilling on 09/11/2020 09:44:20 -------------------------------------------------------------------------------- Vitals Details Patient Name: Date of Service: [name], [name] 09/11/2020 10:00 A M Medical Record Number: [id number] Patient Account Number: [id number] Date of Birth/Sex: Treating RN: Mar 03, 1951 (69 y.o. Helene Shoe, Meta.Reding Primary Care Ivianna Notch: Jori Moll, Karsten Fells Other Clinician: Referring Tiwanda Threats: Treating Amer Alcindor/Extender: Oliver Pila, BRITNEY Weeks in Treatment: 15 Vital Signs Time Taken: 09:39 Temperature (F): 97.7 Height (in): 65 Pulse (bpm): 84 Weight (lbs): 144 Respiratory Rate (breaths/min): 18 Body Mass Index (BMI): 24 Blood Pressure (mmHg): 182/75 Reference  Range: 80 - 120 mg / dl Notes PA and case manager made aware of BP. Electronic Signature(s) Signed: 09/11/2020 5:01:59 PM By: Deon Pilling Entered By: Deon Pilling on 09/11/2020 09:47:54

## 2020-09-17 ENCOUNTER — Encounter: Payer: Self-pay | Admitting: Cardiovascular Disease

## 2020-09-17 ENCOUNTER — Other Ambulatory Visit: Payer: Self-pay

## 2020-09-17 ENCOUNTER — Ambulatory Visit (INDEPENDENT_AMBULATORY_CARE_PROVIDER_SITE_OTHER): Payer: Medicare Other | Admitting: Cardiovascular Disease

## 2020-09-17 VITALS — BP 174/73 | HR 69 | Ht 65.5 in | Wt 145.0 lb

## 2020-09-17 DIAGNOSIS — I2510 Atherosclerotic heart disease of native coronary artery without angina pectoris: Secondary | ICD-10-CM

## 2020-09-17 DIAGNOSIS — E782 Mixed hyperlipidemia: Secondary | ICD-10-CM

## 2020-09-17 DIAGNOSIS — I480 Paroxysmal atrial fibrillation: Secondary | ICD-10-CM | POA: Diagnosis not present

## 2020-09-17 DIAGNOSIS — I739 Peripheral vascular disease, unspecified: Secondary | ICD-10-CM

## 2020-09-17 DIAGNOSIS — Z7689 Persons encountering health services in other specified circumstances: Secondary | ICD-10-CM | POA: Diagnosis not present

## 2020-09-17 DIAGNOSIS — I10 Essential (primary) hypertension: Secondary | ICD-10-CM

## 2020-09-17 DIAGNOSIS — T466X5D Adverse effect of antihyperlipidemic and antiarteriosclerotic drugs, subsequent encounter: Secondary | ICD-10-CM

## 2020-09-17 NOTE — Assessment & Plan Note (Signed)
History of nonobstructive CAD by cath performed by Dr. Ellyn Hack in the past.  She denies chest pain or shortness of breath.
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
## 2020-09-17 NOTE — Assessment & Plan Note (Signed)
History of PAD status post left external iliac artery PTA and stenting as well as right SFA stenting by myself August 2007 with known occluded left SFA.  She has had occlusion of her right SFA stent with critical limb ischemia and angiography by myself 06/27/2015 with placement of 2 overlapping Viabahn stents.  She had angiography by Dr. Fletcher Anon 01/29/2016 in the setting of chronic limb ischemia revealing occluded Viabahn stents and underwent urgent right femoropopliteal bypass grafting by Dr. Kellie Simmering.  Ultimately she had thrombosis of his graft with thrombectomy by Dr. Oneida Alar 01/09/2017 ultimately requiring right BKA.  She is currently using a prosthesis.  Her most recent Doppler studies performed 08/14/2020 revealed patent left iliac stent.

## 2020-09-17 NOTE — Assessment & Plan Note (Addendum)
History of hyperlipidemia on Zetia and fenofibrate therapy intolerant to statin therapy with lipid profile performed 06/28/2020 revealing total cholesterol 162, LDL 88 and HDL 42.

## 2020-09-17 NOTE — Assessment & Plan Note (Signed)
>>  ASSESSMENT AND PLAN FOR MIXED HYPERLIPIDEMIA WRITTEN ON 09/17/2020 11:59 AM BY BERRY, JONATHAN J, MD  History of hyperlipidemia on Zetia  and fenofibrate  therapy intolerant to statin therapy with lipid profile performed 06/28/2020 revealing total cholesterol 162, LDL 88 and HDL 42.

## 2020-09-17 NOTE — Patient Instructions (Signed)

## 2020-09-17 NOTE — Assessment & Plan Note (Addendum)
History of PAF maintaining sinus rhythm on Eliquis oral anticoagulation, and flecainide.

## 2020-09-17 NOTE — Assessment & Plan Note (Signed)
History of essential hypertension blood pressure measured today 174/73.  She is on chlorthalidone, diltiazem and valsartan.

## 2020-09-25 ENCOUNTER — Encounter (HOSPITAL_BASED_OUTPATIENT_CLINIC_OR_DEPARTMENT_OTHER): Payer: Medicare Other | Attending: Physician Assistant | Admitting: Physician Assistant

## 2020-09-25 DIAGNOSIS — Z7689 Persons encountering health services in other specified circumstances: Secondary | ICD-10-CM | POA: Diagnosis not present

## 2020-09-25 DIAGNOSIS — I872 Venous insufficiency (chronic) (peripheral): Secondary | ICD-10-CM | POA: Diagnosis not present

## 2020-09-25 DIAGNOSIS — L97822 Non-pressure chronic ulcer of other part of left lower leg with fat layer exposed: Secondary | ICD-10-CM | POA: Insufficient documentation

## 2020-09-25 DIAGNOSIS — Z89611 Acquired absence of right leg above knee: Secondary | ICD-10-CM | POA: Insufficient documentation

## 2020-09-25 DIAGNOSIS — I7389 Other specified peripheral vascular diseases: Secondary | ICD-10-CM | POA: Diagnosis not present
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
## 2020-09-27 NOTE — Progress Notes (Signed)
[name], [place] ([id number]) Visit Report for 09/25/2020 Arrival Information Details Patient Name: Date of Service: [name], [name] 09/25/2020 10:00 A M Medical Record Number: [id number] Patient Account Number: [id number] Date of Birth/Sex: Treating RN: 1951-09-10 (70 y.o. Nancy Fetter Primary Care Elya Diloreto: Hendricks Limes Other Clinician: Referring Darian Ace: Treating Audray Rumore/Extender: Seward Carol in Treatment: 57 Visit Information History Since Last Visit Added or deleted any medications: No Patient Arrived: Walker Any new allergies or adverse reactions: No Arrival Time: 09:52 Had a fall or experienced change in No Accompanied By: alone activities of daily living that may affect Transfer Assistance: None risk of falls: Patient Identification Verified: Yes Signs or symptoms of abuse/neglect since last visito No Secondary Verification Process Completed: Yes Hospitalized since last visit: No Patient Requires Transmission-Based Precautions: No Implantable device outside of the clinic excluding No Patient Has Alerts: Yes cellular tissue based products placed in the center Patient Alerts: Patient on Blood Thinner since last visit: L ABI non compressible Has Dressing in Place as Prescribed: Yes Pain Present Now: No Electronic Signature(s) Signed: 09/27/2020 5:41:32 PM By: [name], [name] Entered By: Levan Hurst on 09/25/2020 09:52:33 -------------------------------------------------------------------------------- Clinic Level of Care Assessment Details Patient Name: Date of Service: [place], [name] 09/25/2020 10:00 A M Medical Record Number: [id number] Patient Account Number: [id number] Date of Birth/Sex: Treating RN: 22-Nov-1951 (69 y.o. [name] Primary Care Loetta Connelley: Hendricks Limes Other Clinician: Referring Lachele Lievanos: Treating Shalon Salado/Extender: Seward Carol in Treatment: 17 Clinic Level of Care  Assessment Items TOOL 4 Quantity Score []  - 0 Use when only an EandM is performed on FOLLOW-UP visit ASSESSMENTS - Nursing Assessment / Reassessment X- 1 10 Reassessment of Co-morbidities (includes updates in patient status) X- 1 5 Reassessment of Adherence to Treatment Plan ASSESSMENTS - Wound and Skin A ssessment / Reassessment []  - 0 Simple Wound Assessment / Reassessment - one wound X- 2 5 Complex Wound Assessment / Reassessment - multiple wounds []  - 0 Dermatologic / Skin Assessment (not related to wound area) ASSESSMENTS - Focused Assessment []  - 0 Circumferential Edema Measurements - multi extremities []  - 0 Nutritional Assessment / Counseling / Intervention X- 1 5 Lower Extremity Assessment (monofilament, tuning fork, pulses) []  - 0 Peripheral Arterial Disease Assessment (using hand held doppler) ASSESSMENTS - Ostomy and/or Continence Assessment and Care []  - 0 Incontinence Assessment and Management []  - 0 Ostomy Care Assessment and Management (repouching, etc.) PROCESS - Coordination of Care X - Simple Patient / Family Education for ongoing care 1 15 []  - 0 Complex (extensive) Patient / Family Education for ongoing care X- 1 10 Staff obtains Programmer, systems, Records, T Results / Process Orders est []  - 0 Staff telephones HHA, Nursing Homes / Clarify orders / etc []  - 0 Routine Transfer to another Facility (non-emergent condition) []  - 0 Routine Hospital Admission (non-emergent condition) []  - 0 New Admissions / Biomedical engineer / Ordering NPWT Apligraf, etc. , []  - 0 Emergency Hospital Admission (emergent condition) X- 1 10 Simple Discharge Coordination []  - 0 Complex (extensive) Discharge Coordination PROCESS - Special Needs []  - 0 Pediatric / Minor Patient Management []  - 0 Isolation Patient Management []  - 0 Hearing / Language / Visual special needs []  - 0 Assessment of Community assistance (transportation, D/C planning, etc.) []  -  0 Additional assistance / Altered mentation []  - 0 Support Surface(s) Assessment (bed, cushion, seat, etc.) INTERVENTIONS - Wound Cleansing / Measurement []  - 0 Simple  Wound Cleansing - one wound X- 2 5 Complex Wound Cleansing - multiple wounds X- 1 5 Wound Imaging (photographs - any number of wounds) []  - 0 Wound Tracing (instead of photographs) []  - 0 Simple Wound Measurement - one wound X- 2 5 Complex Wound Measurement - multiple wounds INTERVENTIONS - Wound Dressings X - Small Wound Dressing one or multiple wounds 2 10 []  - 0 Medium Wound Dressing one or multiple wounds []  - 0 Large Wound Dressing one or multiple wounds X- 1 5 Application of Medications - topical []  - 0 Application of Medications - injection INTERVENTIONS - Miscellaneous []  - 0 External ear exam []  - 0 Specimen Collection (cultures, biopsies, blood, body fluids, etc.) []  - 0 Specimen(s) / Culture(s) sent or taken to Lab for analysis []  - 0 Patient Transfer (multiple staff / Civil Service fast streamer / Similar devices) []  - 0 Simple Staple / Suture removal (25 or less) []  - 0 Complex Staple / Suture removal (26 or more) []  - 0 Hypo / Hyperglycemic Management (close monitor of Blood Glucose) []  - 0 Ankle / Brachial Index (ABI) - do not check if billed separately X- 1 5 Vital Signs Has the patient been seen at the hospital within the last three years: Yes Total Score: 120 Level Of Care: New/Established - Level 4 Electronic Signature(s) Signed: 09/25/2020 6:14:29 PM By: Baruch Gouty RN, [name] Entered By: Baruch Gouty on 09/25/2020 11:15:12 -------------------------------------------------------------------------------- Encounter Discharge Information Details Patient Name: Date of Service: [place] [name], [name] 09/25/2020 10:00 A M Medical Record Number: [id number] Patient Account Number: [id number] Date of Birth/Sex: Treating RN: Jun 26, 1951 (69 y.o. Debby Bud Primary Care Shyanna Klingel: Hendricks Limes Other  Clinician: Referring Corion Sherrod: Treating Aarianna Hoadley/Extender: Seward Carol in Treatment: 17 Encounter Discharge Information Items Discharge Condition: Stable Ambulatory Status: Walker Discharge Destination: Home Transportation: Private Auto Accompanied By: self Schedule Follow-up Appointment: Yes Clinical Summary of Care: Electronic Signature(s) Signed: 09/25/2020 5:54:43 PM By: Deon Pilling Entered By: Deon Pilling on 09/25/2020 11:27:54 -------------------------------------------------------------------------------- Lower Extremity Assessment Details Patient Name: Date of Service: [place], [name] 09/25/2020 10:00 A M Medical Record Number: [id number] Patient Account Number: [id number] Date of Birth/Sex: Treating RN: Jun 06, 1951 (69 y.o. Nancy Fetter Primary Care Baldemar Dady: Hendricks Limes Other Clinician: Referring Skyy Nilan: Treating Geanie Pacifico/Extender: Josephina Gip, Britney Weeks in Treatment: 17 Edema Assessment Assessed: [Left: No] [Right: No] Edema: [Left: Ye] [Right: s] Calf Left: Right: Point of Measurement: 28 cm From Medial Instep 31.2 cm Ankle Left: Right: Point of Measurement: 9 cm From Medial Instep 18.5 cm Vascular Assessment Pulses: Dorsalis Pedis Palpable: [Left:Yes] Electronic Signature(s) Signed: 09/27/2020 5:41:32 PM By: [name], [name] Entered By: Levan Hurst on 09/25/2020 09:57:29 -------------------------------------------------------------------------------- Multi-Disciplinary Care Plan Details Patient Name: Date of Service: [place] [name], [name] 09/25/2020 10:00 A M Medical Record Number: [id number] Patient Account Number: [id number] Date of Birth/Sex: Treating RN: [birth year] (69 y.o. [name] Primary Care Almeta Geisel: Hendricks Limes Other Clinician: Referring Deandre Stansel: Treating Hiilani Jetter/Extender: Edwena Blow Weeks in Treatment: 17 Active Inactive Wound/Skin  Impairment Nursing Diagnoses: Knowledge deficit related to ulceration/compromised skin integrity Goals: Patient/caregiver will verbalize understanding of skin care regimen Date Initiated: 05/29/2020 Target Resolution Date: 10/16/2020 Goal Status: Active Interventions: Assess patient/caregiver ability to perform ulcer/skin care regimen upon admission and as needed Assess ulceration(s) every visit Provide education on ulcer and skin care Treatment Activities: Skin care regimen initiated : 05/29/2020 Topical wound management initiated : 05/29/2020 Notes: Electronic Signature(s)  Signed: 09/25/2020 6:14:29 PM By: Baruch Gouty RN, [name] Entered By: Baruch Gouty on 09/25/2020 11:10:06 -------------------------------------------------------------------------------- Pain Assessment Details Patient Name: Date of Service: [place] [name], [name] 09/25/2020 10:00 A M Medical Record Number: [id number] Patient Account Number: [id number] Date of Birth/Sex: Treating RN: 04/02/1951 (69 y.o. Nancy Fetter Primary Care Seeley Southgate: Hendricks Limes Other Clinician: Referring Carmine Carrozza: Treating Abyan Cadman/Extender: Edwena Blow Weeks in Treatment: 17 Active Problems Location of Pain Severity and Description of Pain Patient Has Paino No Site Locations Pain Management and Medication Current Pain Management: Electronic Signature(s) Signed: 09/27/2020 5:41:32 PM By: [name], [name] Entered By: Levan Hurst on 09/25/2020 09:52:54 -------------------------------------------------------------------------------- Patient/Caregiver Education Details Patient Name: Date of Service: [place] [name], [name] 10/6/2021andnbsp10:00 A M Medical Record Number: [id number] Patient Account Number: [id number] Date of Birth/Gender: Treating RN: Feb 19, 1951 (69 y.o. [name] Primary Care Physician: Hendricks Limes Other Clinician: Referring Physician: Treating Physician/Extender: Seward Carol in Treatment: 17 Education Assessment Education Provided To: Patient Education Topics Provided Tissue Oxygenation: Methods: Explain/Verbal Responses: Reinforcements needed, State content correctly Wound/Skin Impairment: Methods: Explain/Verbal Responses: Reinforcements needed, State content correctly Electronic Signature(s) Signed: 09/25/2020 6:14:29 PM By: Baruch Gouty RN, [name] Entered By: Baruch Gouty on 09/25/2020 11:14:37 -------------------------------------------------------------------------------- Wound Assessment Details Patient Name: Date of Service: [place]acey [place], [name] 09/25/2020 10:00 A M Medical Record Number: [id number] Patient Account Number: [id number] Date of Birth/Sex: Treating RN: 1951-11-30 (69 y.o. Nancy Fetter Primary Care Pearlina Friedly: Hendricks Limes Other Clinician: Referring Markita Stcharles: Treating Poppy Mcafee/Extender: Edwena Blow Weeks in Treatment: 17 Wound Status Wound Number: 10 Primary Arterial Insufficiency Ulcer Etiology: Wound Location: Left, Lateral Lower Leg Wound Open Wounding Event: Gradually Appeared Status: Date Acquired: 07/24/2020 Comorbid Glaucoma, Arrhythmia, Hypertension, Peripheral Arterial Disease, Weeks Of Treatment: 9 History: Peripheral Venous Disease, Gout, Osteoarthritis Clustered Wound: No Photos Photo Uploaded By: Mikeal Hawthorne on 09/26/2020 12:04:16 Wound Measurements Length: (cm) 0.9 Width: (cm) 0.6 Depth: (cm) 0.2 Area: (cm) 0.424 Volume: (cm) 0.085 % Reduction in Area: -68.9% % Reduction in Volume: -240% Epithelialization: Small (1-33%) Tunneling: No Undermining: No Wound Description Classification: Full Thickness Without Exposed Support Structures Wound Margin: Flat and Intact Exudate Amount: Medium Exudate Type: Serous Exudate Color: amber Foul Odor After Cleansing: No Slough/Fibrino Yes Wound Bed Granulation Amount: Medium (34-66%) Exposed  Structure Granulation Quality: Pink, Pale Fascia Exposed: No Necrotic Amount: Medium (34-66%) Fat Layer (Subcutaneous Tissue) Exposed: Yes Necrotic Quality: Adherent Slough Tendon Exposed: No Muscle Exposed: No Joint Exposed: No Bone Exposed: No Treatment Notes Wound #10 (Left, Lateral Lower Leg) 1. Cleanse With Wound Cleanser 3. Primary Dressing Applied Calcium Alginate Ag Other primary dressing (specifiy in notes) 4. Secondary Dressing Dry Gauze Roll Gauze 5. Secured With Medipore tape Notes bactroban ointment applied under alginate Ag. Electronic Signature(s) Signed: 09/27/2020 5:41:32 PM By: [name], [name] Entered By: Levan Hurst on 09/25/2020 09:58:12 -------------------------------------------------------------------------------- Wound Assessment Details Patient Name: Date of Service: [place] [name], [name] 09/25/2020 10:00 A M Medical Record Number: [id number] Patient Account Number: [id number] Date of Birth/Sex: Treating RN: 16-May-1951 (69 y.o. Nancy Fetter Primary Care Annissa Andreoni: Hendricks Limes Other Clinician: Referring Maui Ahart: Treating Roger Fasnacht/Extender: Edwena Blow Weeks in Treatment: 17 Wound Status Wound Number: 8 Primary Venous Leg Ulcer Etiology: Wound Location: Left, Medial Lower Leg Wound Open Wounding Event: Blister Status: Date Acquired: 04/11/2020 Comorbid Glaucoma, Arrhythmia, Hypertension, Peripheral Arterial Disease, Weeks Of Treatment: 17 History: Peripheral Venous Disease, Gout, Osteoarthritis Clustered Wound:  Yes Photos Photo Uploaded By: Mikeal Hawthorne on 09/26/2020 12:04:17 Wound Measurements Length: (cm) 0.2 Width: (cm) 0.2 Depth: (cm) 0.1 Clustered Quantity: 1 Area: (cm) 0.031 Volume: (cm) 0.003 % Reduction in Area: 100% % Reduction in Volume: 100% Epithelialization: Large (67-100%) Tunneling: No Undermining: No Wound Description Classification: Full Thickness Without Exposed Support  Structures Wound Margin: Flat and Intact Exudate Amount: Medium Exudate Type: Serous Exudate Color: amber Foul Odor After Cleansing: No Slough/Fibrino No Wound Bed Granulation Amount: Large (67-100%) Exposed Structure Granulation Quality: Pink, Pale Fascia Exposed: No Necrotic Amount: None Present (0%) Fat Layer (Subcutaneous Tissue) Exposed: Yes Tendon Exposed: No Muscle Exposed: No Joint Exposed: No Bone Exposed: No Treatment Notes Wound #8 (Left, Medial Lower Leg) 1. Cleanse With Wound Cleanser 3. Primary Dressing Applied Calcium Alginate Ag Other primary dressing (specifiy in notes) 4. Secondary Dressing Dry Gauze Roll Gauze 5. Secured With Medipore tape Notes bactroban ointment applied under alginate Ag. Electronic Signature(s) Signed: 09/27/2020 5:41:32 PM By: [name], [name] Entered By: Levan Hurst on 09/25/2020 09:58:43 -------------------------------------------------------------------------------- Bostic Details Patient Name: Date of Service: [place] Stacey [place], [name] 09/25/2020 10:00 A M Medical Record Number: [id number] Patient Account Number: [id number] Date of Birth/Sex: Treating RN: July 06, 1951 (69 y.o. Nancy Fetter Primary Care Lynnwood Beckford: Hendricks Limes Other Clinician: Referring Jewel Venditto: Treating Ramya Vanbergen/Extender: Edwena Blow Weeks in Treatment: 17 Vital Signs Time Taken: 09:52 Temperature (F): 98.4 Height (in): 65 Pulse (bpm): 73 Weight (lbs): 144 Respiratory Rate (breaths/min): 16 Body Mass Index (BMI): 24 Blood Pressure (mmHg): 117/75 Reference Range: 80 - 120 mg / dl Electronic Signature(s) Signed: 09/27/2020 5:41:32 PM By: [name], [name] Entered By: Levan Hurst on 09/25/2020 09:52:49

## 2020-10-03 ENCOUNTER — Inpatient Hospital Stay: Payer: Medicare Other

## 2020-10-03 ENCOUNTER — Inpatient Hospital Stay: Payer: Medicare Other | Attending: Hematology

## 2020-10-03 ENCOUNTER — Other Ambulatory Visit: Payer: Self-pay

## 2020-10-03 VITALS — BP 156/53 | HR 73 | Temp 98.2°F | Resp 18

## 2020-10-03 DIAGNOSIS — D45 Polycythemia vera: Secondary | ICD-10-CM | POA: Diagnosis not present

## 2020-10-03 DIAGNOSIS — Z7689 Persons encountering health services in other specified circumstances: Secondary | ICD-10-CM | POA: Diagnosis not present

## 2020-10-03 DIAGNOSIS — D500 Iron deficiency anemia secondary to blood loss (chronic): Secondary | ICD-10-CM

## 2020-10-03 DIAGNOSIS — T466X5A Adverse effect of antihyperlipidemic and antiarteriosclerotic drugs, initial encounter: Secondary | ICD-10-CM | POA: Insufficient documentation

## 2020-10-03 LAB — CBC WITH DIFFERENTIAL (CANCER CENTER ONLY)
Abs Immature Granulocytes: 0.81 10*3/uL — ABNORMAL HIGH (ref 0.00–0.07)
Basophils Absolute: 0.3 10*3/uL — ABNORMAL HIGH (ref 0.0–0.1)
Basophils Relative: 1 %
Eosinophils Absolute: 0.6 10*3/uL — ABNORMAL HIGH (ref 0.0–0.5)
Eosinophils Relative: 3 %
HCT: 48.1 % — ABNORMAL HIGH (ref 36.0–46.0)
Hemoglobin: 14.5 g/dL (ref 12.0–15.0)
Immature Granulocytes: 4 %
Lymphocytes Relative: 7 %
Lymphs Abs: 1.4 10*3/uL (ref 0.7–4.0)
MCH: 23.2 pg — ABNORMAL LOW (ref 26.0–34.0)
MCHC: 30.1 g/dL (ref 30.0–36.0)
MCV: 76.8 fL — ABNORMAL LOW (ref 80.0–100.0)
Monocytes Absolute: 0.7 10*3/uL (ref 0.1–1.0)
Monocytes Relative: 3 %
Neutro Abs: 18.3 10*3/uL — ABNORMAL HIGH (ref 1.7–7.7)
Neutrophils Relative %: 82 %
Platelet Count: 396 10*3/uL (ref 150–400)
RBC: 6.26 MIL/uL — ABNORMAL HIGH (ref 3.87–5.11)
RDW: 20.3 % — ABNORMAL HIGH (ref 11.5–15.5)
WBC Count: 22.2 10*3/uL — ABNORMAL HIGH (ref 4.0–10.5)
nRBC: 0.5 % — ABNORMAL HIGH (ref 0.0–0.2)

## 2020-10-03 LAB — FERRITIN: Ferritin: 31 ng/mL (ref 11–307)

## 2020-10-03 LAB — IRON AND TIBC
Iron: 19 ug/dL — ABNORMAL LOW (ref 41–142)
Saturation Ratios: 4 % — ABNORMAL LOW (ref 21–57)
TIBC: 444 ug/dL (ref 236–444)
UIBC: 425 ug/dL — ABNORMAL HIGH (ref 120–384)

## 2020-10-03 NOTE — Patient Instructions (Signed)

## 2020-10-03 NOTE — Progress Notes (Signed)
[name] presents today for phlebotomy per MD orders. Phlebotomy procedure started at 1140 w/ 16 G phlebotomy set, and ended at 1146. 529 grams removed. Patient observed for 30 minutes after procedure without any incident. Patient tolerated procedure well. IV needle removed intact.

## 2020-10-04 LAB — PATHOLOGIST SMEAR REVIEW

## 2020-10-09 ENCOUNTER — Encounter (HOSPITAL_BASED_OUTPATIENT_CLINIC_OR_DEPARTMENT_OTHER): Payer: Medicare Other | Admitting: Physician Assistant

## 2020-10-09 DIAGNOSIS — Z7689 Persons encountering health services in other specified circumstances: Secondary | ICD-10-CM | POA: Diagnosis not present

## 2020-10-16 ENCOUNTER — Other Ambulatory Visit: Payer: Self-pay

## 2020-10-16 ENCOUNTER — Encounter (HOSPITAL_BASED_OUTPATIENT_CLINIC_OR_DEPARTMENT_OTHER): Payer: Medicare Other | Admitting: Physician Assistant

## 2020-10-16 DIAGNOSIS — Z89611 Acquired absence of right leg above knee: Secondary | ICD-10-CM | POA: Diagnosis not present

## 2020-10-16 DIAGNOSIS — Z7689 Persons encountering health services in other specified circumstances: Secondary | ICD-10-CM | POA: Diagnosis not present

## 2020-10-16 DIAGNOSIS — I7389 Other specified peripheral vascular diseases: Secondary | ICD-10-CM | POA: Diagnosis not present

## 2020-10-16 DIAGNOSIS — I872 Venous insufficiency (chronic) (peripheral): Secondary | ICD-10-CM | POA: Diagnosis not present

## 2020-10-16 DIAGNOSIS — L97822 Non-pressure chronic ulcer of other part of left lower leg with fat layer exposed: Secondary | ICD-10-CM | POA: Diagnosis not present
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
## 2020-10-21 NOTE — Progress Notes (Signed)
KAAVYA, PUSKARICH ([id number]) Visit Report for 10/16/2020 Arrival Information Details Patient Name: Date of Service: [place], [name] 10/16/2020 9:30 A M Medical Record Number: [id number] Patient Account Number: [id number] Date of Birth/Sex: Treating RN: 1951/07/14 (69 y.o. [name] Primary Care Negar Sieler: Hendricks Limes Other Clinician: Referring Adekunle Rohrbach: Treating Ivelise Castillo/Extender: Edwena Blow Weeks in Treatment: 20 Visit Information History Since Last Visit Added or deleted any medications: No Patient Arrived: [name] Any new allergies or adverse reactions: No Arrival Time: 09:39 Had a fall or experienced change in No Accompanied By: self activities of daily living that may affect Transfer Assistance: None risk of falls: Patient Identification Verified: Yes Signs or symptoms of abuse/neglect since last visito No Secondary Verification Process Completed: Yes Hospitalized since last visit: No Patient Requires Transmission-Based Precautions: No Implantable device outside of the clinic excluding No Patient Has Alerts: Yes cellular tissue based products placed in the center Patient Alerts: Patient on Blood Thinner since last visit: L ABI non compressible Has Dressing in Place as Prescribed: Yes Pain Present Now: No Electronic Signature(s) Signed: 10/21/2020 8:21:27 AM By: Sandre Kitty Entered By: Sandre Kitty on 10/16/2020 09:39:52 -------------------------------------------------------------------------------- Clinic Level of Care Assessment Details Patient Name: Date of Service: [place], [name] 10/16/2020 9:30 A M Medical Record Number: [id number] Patient Account Number: [id number] Date of Birth/Sex: Treating RN: 26-May-1951 (69 y.o. [name] Primary Care Zakiah Gauthreaux: Hendricks Limes Other Clinician: Referring Meeyah Ovitt: Treating Johnattan Strassman/Extender: Edwena Blow Weeks in Treatment: 20 Clinic Level of Care  Assessment Items TOOL 4 Quantity Score []  - 0 Use when only an EandM is performed on FOLLOW-UP visit ASSESSMENTS - Nursing Assessment / Reassessment X- 1 10 Reassessment of Co-morbidities (includes updates in patient status) X- 1 5 Reassessment of Adherence to Treatment Plan ASSESSMENTS - Wound and Skin A ssessment / Reassessment []  - 0 Simple Wound Assessment / Reassessment - one wound X- 2 5 Complex Wound Assessment / Reassessment - multiple wounds []  - 0 Dermatologic / Skin Assessment (not related to wound area) ASSESSMENTS - Focused Assessment []  - 0 Circumferential Edema Measurements - multi extremities []  - 0 Nutritional Assessment / Counseling / Intervention X- 1 5 Lower Extremity Assessment (monofilament, tuning fork, pulses) []  - 0 Peripheral Arterial Disease Assessment (using hand held doppler) ASSESSMENTS - Ostomy and/or Continence Assessment and Care []  - 0 Incontinence Assessment and Management []  - 0 Ostomy Care Assessment and Management (repouching, etc.) PROCESS - Coordination of Care X - Simple Patient / Family Education for ongoing care 1 15 []  - 0 Complex (extensive) Patient / Family Education for ongoing care X- 1 10 Staff obtains Programmer, systems, Records, T Results / Process Orders est []  - 0 Staff telephones HHA, Nursing Homes / Clarify orders / etc []  - 0 Routine Transfer to another Facility (non-emergent condition) []  - 0 Routine Hospital Admission (non-emergent condition) []  - 0 New Admissions / Biomedical engineer / Ordering NPWT Apligraf, etc. , []  - 0 Emergency Hospital Admission (emergent condition) X- 1 10 Simple Discharge Coordination []  - 0 Complex (extensive) Discharge Coordination PROCESS - Special Needs []  - 0 Pediatric / Minor Patient Management []  - 0 Isolation Patient Management []  - 0 Hearing / Language / Visual special needs []  - 0 Assessment of Community assistance (transportation, D/C planning, etc.) []  -  0 Additional assistance / Altered mentation []  - 0 Support Surface(s) Assessment (bed, cushion, seat, etc.) INTERVENTIONS - Wound Cleansing / Measurement []  - 0 Simple Wound Cleansing -  one wound X- 2 5 Complex Wound Cleansing - multiple wounds X- 1 5 Wound Imaging (photographs - any number of wounds) []  - 0 Wound Tracing (instead of photographs) []  - 0 Simple Wound Measurement - one wound X- 2 5 Complex Wound Measurement - multiple wounds INTERVENTIONS - Wound Dressings X - Small Wound Dressing one or multiple wounds 2 10 []  - 0 Medium Wound Dressing one or multiple wounds []  - 0 Large Wound Dressing one or multiple wounds X- 1 5 Application of Medications - topical []  - 0 Application of Medications - injection INTERVENTIONS - Miscellaneous []  - 0 External ear exam []  - 0 Specimen Collection (cultures, biopsies, blood, body fluids, etc.) []  - 0 Specimen(s) / Culture(s) sent or taken to Lab for analysis []  - 0 Patient Transfer (multiple staff / Civil Service fast streamer / Similar devices) []  - 0 Simple Staple / Suture removal (25 or less) []  - 0 Complex Staple / Suture removal (26 or more) []  - 0 Hypo / Hyperglycemic Management (close monitor of Blood Glucose) []  - 0 Ankle / Brachial Index (ABI) - do not check if billed separately X- 1 5 Vital Signs Has the patient been seen at the hospital within the last three years: Yes Total Score: 120 Level Of Care: New/Established - Level 4 Electronic Signature(s) Signed: 10/16/2020 5:36:04 PM By: Baruch Gouty RN, [name] Entered By: Baruch Gouty on 10/16/2020 11:00:37 -------------------------------------------------------------------------------- Encounter Discharge Information Details Patient Name: Date of Service: [name], [name] 10/16/2020 9:30 A M Medical Record Number: [id number] Patient Account Number: [id number] Date of Birth/Sex: Treating RN: 10-Jul-1951 (69 y.o. Debby Bud Primary Care Rayane Gallardo: Hendricks Limes Other  Clinician: Referring Shayanna Thatch: Treating Milli Woolridge/Extender: Seward Carol in Treatment: 20 Encounter Discharge Information Items Discharge Condition: Stable Ambulatory Status: [place] Discharge Destination: Home Transportation: Private Auto Accompanied By: self Schedule Follow-up Appointment: Yes Clinical Summary of Care: Electronic Signature(s) Signed: 10/17/2020 5:24:43 PM By: Deon Pilling Entered By: Deon Pilling on 10/16/2020 12:39:18 -------------------------------------------------------------------------------- Lower Extremity Assessment Details Patient Name: Date of Service: [place], [name] 10/16/2020 9:30 A M Medical Record Number: [id number] Patient Account Number: [id number] Date of Birth/Sex: Treating RN: 1951/01/22 (69 y.o. Nancy Fetter Primary Care Lillianah Swartzentruber: Hendricks Limes Other Clinician: Referring Gali Spinney: Treating Laisha Rau/Extender: Josephina Gip, Britney Weeks in Treatment: 20 Edema Assessment Assessed: [Left: No] [Right: No] Edema: [Left: Ye] [Right: s] Calf Left: Right: Point of Measurement: 28 cm From Medial Instep 32 cm Ankle Left: Right: Point of Measurement: 9 cm From Medial Instep 18.5 cm Vascular Assessment Pulses: Dorsalis Pedis Palpable: [Left:Yes] Electronic Signature(s) Signed: 10/16/2020 5:38:30 PM By: [name], [name] Entered By: Levan Hurst on 10/16/2020 09:52:25 -------------------------------------------------------------------------------- Multi-Disciplinary Care Plan Details Patient Name: Date of Service: [name], [name] 10/16/2020 9:30 A M Medical Record Number: [id number] Patient Account Number: [id number] Date of Birth/Sex: Treating RN: 19-Nov-1951 (69 y.o. [name] Primary Care Quinne Pires: Hendricks Limes Other Clinician: Referring Sumiye Hirth: Treating Huriel Matt/Extender: Josephina Gip, Britney Weeks in Treatment: 20 Active Inactive Wound/Skin  Impairment Nursing Diagnoses: Knowledge deficit related to ulceration/compromised skin integrity Goals: Patient/caregiver will verbalize understanding of skin care regimen Date Initiated: 05/29/2020 Target Resolution Date: 11/13/2020 Goal Status: Active Interventions: Assess patient/caregiver ability to perform ulcer/skin care regimen upon admission and as needed Assess ulceration(s) every visit Provide education on ulcer and skin care Treatment Activities: Skin care regimen initiated : 05/29/2020 Topical wound management initiated : 05/29/2020 Notes: Electronic Signature(s) Signed: 10/16/2020 5:36:04  PM By: Baruch Gouty RN, [name] Entered By: Baruch Gouty on 10/16/2020 10:57:24 -------------------------------------------------------------------------------- Pain Assessment Details Patient Name: Date of Service: [place], [name] 10/16/2020 9:30 A M Medical Record Number: [id number] Patient Account Number: [id number] Date of Birth/Sex: Treating RN: March 21, 1951 (69 y.o. [name] Primary Care Konrad Hoak: Hendricks Limes Other Clinician: Referring Dekota Kirlin: Treating Julianne Chamberlin/Extender: Edwena Blow Weeks in Treatment: 20 Active Problems Location of Pain Severity and Description of Pain Patient Has Paino No Site Locations Pain Management and Medication Current Pain Management: Electronic Signature(s) Signed: 10/16/2020 5:36:04 PM By: Baruch Gouty RN, [name] Signed: 10/21/2020 8:21:27 AM By: Sandre Kitty Entered By: Sandre Kitty on 10/16/2020 09:40:16 -------------------------------------------------------------------------------- Patient/Caregiver Education Details Patient Name: Date of Service: Marguerita [place], [name] 10/27/2021andnbsp9:30 A M Medical Record Number: [id number] Patient Account Number: [id number] Date of Birth/Gender: Treating RN: Aug 31, 1951 (69 y.o. [name] Primary Care Physician: Hendricks Limes Other Clinician: Referring  Physician: Treating Physician/Extender: Seward Carol in Treatment: 20 Education Assessment Education Provided To: Patient Education Topics Provided Wound/Skin Impairment: Methods: Explain/Verbal Responses: Reinforcements needed, State content correctly Electronic Signature(s) Signed: 10/16/2020 5:36:04 PM By: Baruch Gouty RN, [name] Entered By: Baruch Gouty on 10/16/2020 10:57:52 -------------------------------------------------------------------------------- Wound Assessment Details Patient Name: Date of Service: [name], [name] 10/16/2020 9:30 A M Medical Record Number: [id number] Patient Account Number: [id number] Date of Birth/Sex: Treating RN: 11-23-1951 (69 y.o. Martyn Malay, [name] Primary Care Jernie Schutt: Hendricks Limes Other Clinician: Referring Jalyssa Fleisher: Treating Hajra Port/Extender: Josephina Gip, Britney Weeks in Treatment: 20 Wound Status Wound Number: 10 Primary Arterial Insufficiency Ulcer Etiology: Wound Location: Left, Lateral Lower Leg Wound Open Wounding Event: Gradually Appeared Status: Date Acquired: 07/24/2020 Comorbid Glaucoma, Arrhythmia, Hypertension, Peripheral Arterial Disease, Weeks Of Treatment: 12 History: Peripheral Venous Disease, Gout, Osteoarthritis Clustered Wound: No Photos Photo Uploaded By: Mikeal Hawthorne on 10/18/2020 09:53:53 Wound Measurements Length: (cm) 0.8 Width: (cm) 0.5 Depth: (cm) 0.2 Area: (cm) 0.314 Volume: (cm) 0.063 % Reduction in Area: -25.1% % Reduction in Volume: -152% Epithelialization: Small (1-33%) Tunneling: No Undermining: No Wound Description Classification: Full Thickness Without Exposed Support Structures Wound Margin: Flat and Intact Exudate Amount: Medium Exudate Type: Serous Exudate Color: amber Foul Odor After Cleansing: No Slough/Fibrino Yes Wound Bed Granulation Amount: Small (1-33%) Exposed Structure Granulation Quality: Pink, Pale Fascia Exposed:  No Necrotic Amount: Large (67-100%) Fat Layer (Subcutaneous Tissue) Exposed: Yes Necrotic Quality: Adherent Slough Tendon Exposed: No Muscle Exposed: No Joint Exposed: No Bone Exposed: No Treatment Notes Wound #10 (Left, Lateral Lower Leg) 1. Cleanse With Wound Cleanser Soap and water 3. Primary Dressing Applied Calcium Alginate Ag Other primary dressing (specifiy in notes) 4. Secondary Dressing Dry Gauze Roll Gauze 5. Secured With Medipore tape Notes bactroban ointment under alginate Ag. Electronic Signature(s) Signed: 10/16/2020 5:36:04 PM By: Baruch Gouty RN, [name] Signed: 10/16/2020 5:38:30 PM By: [name], [name] Entered By: Levan Hurst on 10/16/2020 09:52:51 -------------------------------------------------------------------------------- Wound Assessment Details Patient Name: Date of Service: [name], [name] 10/16/2020 9:30 A M Medical Record Number: [id number] Patient Account Number: [id number] Date of Birth/Sex: Treating RN: 02-11-1951 (70 y.o. [name] Primary Care Zyasia Halbleib: Hendricks Limes Other Clinician: Referring Cherell Colvin: Treating Tyffany Waldrop/Extender: Josephina Gip, Britney Weeks in Treatment: 20 Wound Status Wound Number: 11 Primary Venous Leg Ulcer Etiology: Wound Location: Left, Proximal, Lateral Lower Leg Wound Open Wounding Event: Gradually Appeared Status: Date Acquired: 10/16/2020 Comorbid Glaucoma, Arrhythmia, Hypertension, Peripheral Arterial Disease, Weeks Of Treatment: 0 History:  Peripheral Venous Disease, Gout, Osteoarthritis Clustered Wound: No Photos Photo Uploaded By: Mikeal Hawthorne on 10/18/2020 09:54:16 Wound Measurements Length: (cm) 1.1 Width: (cm) 0.5 Depth: (cm) 0.1 Area: (cm) 0.432 Volume: (cm) 0.043 % Reduction in Area: 0% % Reduction in Volume: 0% Epithelialization: None Tunneling: No Undermining: No Wound Description Classification: Full Thickness Without Exposed Support  Structures Wound Margin: Flat and Intact Exudate Amount: Medium Exudate Type: Serous Exudate Color: amber Foul Odor After Cleansing: No Slough/Fibrino Yes Wound Bed Granulation Amount: Small (1-33%) Exposed Structure Granulation Quality: Pink, Pale Fascia Exposed: No Necrotic Amount: Large (67-100%) Fat Layer (Subcutaneous Tissue) Exposed: Yes Necrotic Quality: Adherent Slough Tendon Exposed: No Muscle Exposed: No Joint Exposed: No Bone Exposed: No Treatment Notes Wound #11 (Left, Proximal, Lateral Lower Leg) 1. Cleanse With Wound Cleanser Soap and water 3. Primary Dressing Applied Calcium Alginate Ag Other primary dressing (specifiy in notes) 4. Secondary Dressing Dry Gauze Roll Gauze 5. Secured With Medipore tape Notes bactroban ointment under alginate Ag. Electronic Signature(s) Signed: 10/16/2020 5:36:04 PM By: Baruch Gouty RN, [name] Signed: 10/16/2020 5:38:30 PM By: [name], [name] Entered By: Levan Hurst on 10/16/2020 09:53:17 -------------------------------------------------------------------------------- Wound Assessment Details Patient Name: Date of Service: [name], [name] 10/16/2020 9:30 A M Medical Record Number: [id number] Patient Account Number: [id number] Date of Birth/Sex: Treating RN: 1950-12-23 (69 y.o. Martyn Malay, [name] Primary Care Briena Swingler: Hendricks Limes Other Clinician: Referring Kathryne Ramella: Treating Maeleigh Buschman/Extender: Josephina Gip, Britney Weeks in Treatment: 20 Wound Status Wound Number: 12 Primary Venous Leg Ulcer Etiology: Wound Location: Left, Posterior Lower Leg Wound Open Wounding Event: Gradually Appeared Status: Date Acquired: 10/16/2020 Comorbid Glaucoma, Arrhythmia, Hypertension, Peripheral Arterial Disease, Weeks Of Treatment: 0 History: Peripheral Venous Disease, Gout, Osteoarthritis Clustered Wound: No Photos Photo Uploaded By: Mikeal Hawthorne on 10/18/2020 09:54:16 Wound Measurements Length:  (cm) 1.5 Width: (cm) 0.5 Depth: (cm) 0.1 Area: (cm) 0.589 Volume: (cm) 0.059 % Reduction in Area: 0% % Reduction in Volume: 0% Epithelialization: None Tunneling: No Undermining: No Wound Description Classification: Full Thickness Without Exposed Support Structures Wound Margin: Flat and Intact Exudate Amount: Medium Exudate Type: Serous Exudate Color: amber Foul Odor After Cleansing: No Slough/Fibrino Yes Wound Bed Granulation Amount: Small (1-33%) Exposed Structure Granulation Quality: Pink, Pale Fascia Exposed: No Necrotic Amount: Large (67-100%) Fat Layer (Subcutaneous Tissue) Exposed: Yes Necrotic Quality: Adherent Slough Tendon Exposed: No Muscle Exposed: No Joint Exposed: No Bone Exposed: No Treatment Notes Wound #12 (Left, Posterior Lower Leg) 1. Cleanse With Wound Cleanser Soap and water 3. Primary Dressing Applied Calcium Alginate Ag Other primary dressing (specifiy in notes) 4. Secondary Dressing Dry Gauze Roll Gauze 5. Secured With Medipore tape Notes bactroban ointment under alginate Ag. Electronic Signature(s) Signed: 10/16/2020 5:36:04 PM By: Baruch Gouty RN, [name] Signed: 10/16/2020 5:38:30 PM By: [name], [name] Entered By: Levan Hurst on 10/16/2020 09:53:43 -------------------------------------------------------------------------------- Wound Assessment Details Patient Name: Date of Service: [name], [name] 10/16/2020 9:30 A M Medical Record Number: [id number] Patient Account Number: [id number] Date of Birth/Sex: Treating RN: 01/02/1951 (69 y.o. [name] Primary Care Kately Graffam: Hendricks Limes Other Clinician: Referring Amora Sheehy: Treating Elmo Shumard/Extender: Josephina Gip, Britney Weeks in Treatment: 20 Wound Status Wound Number: 8 Primary Venous Leg Ulcer Etiology: Wound Location: Left, Medial Lower Leg Wound Healed - Epithelialized Wounding Event: Blister Status: Date Acquired: 04/11/2020 Comorbid  Glaucoma, Arrhythmia, Hypertension, Peripheral Arterial Disease, Weeks Of Treatment: 20 History: Peripheral Venous Disease, Gout, Osteoarthritis Clustered Wound: Yes Photos Photo Uploaded By: Mikeal Hawthorne on  10/18/2020 09:53:54 Wound Measurements Length: (cm) Width: (cm) Depth: (cm) Clustered Quantity: Area: (cm) Volume: (cm) 0 % Reduction in Area: 100% 0 % Reduction in Volume: 100% 0 Epithelialization: Large (67-100%) 1 Tunneling: No 0 Undermining: No 0 Wound Description Classification: Full Thickness Without Exposed Support Structures Wound Margin: Flat and Intact Exudate Amount: None Present Foul Odor After Cleansing: No Slough/Fibrino No Wound Bed Granulation Amount: None Present (0%) Exposed Structure Necrotic Amount: None Present (0%) Fascia Exposed: No Fat Layer (Subcutaneous Tissue) Exposed: No Tendon Exposed: No Muscle Exposed: No Joint Exposed: No Bone Exposed: No Electronic Signature(s) Signed: 10/16/2020 5:36:04 PM By: Baruch Gouty RN, [name] Signed: 10/16/2020 5:38:30 PM By: [name], [name] Entered By: Levan Hurst on 10/16/2020 09:54:01 -------------------------------------------------------------------------------- [place] Details Patient Name: Date of Service: [name], [name] 10/16/2020 9:30 A M Medical Record Number: [id number] Patient Account Number: [id number] Date of Birth/Sex: Treating RN: 1951/09/27 (69 y.o. [name] Primary Care Devine Klingel: Hendricks Limes Other Clinician: Referring Morganne Haile: Treating Verlene Glantz/Extender: Josephina Gip, Britney Weeks in Treatment: 20 Vital Signs Time Taken: 09:39 Temperature (F): 98.5 Height (in): 65 Pulse (bpm): 84 Weight (lbs): 144 Respiratory Rate (breaths/min): 16 Body Mass Index (BMI): 24 Blood Pressure (mmHg): 186/69 Reference Range: 80 - 120 mg / dl Electronic Signature(s) Signed: 10/21/2020 8:21:27 AM By: Sandre Kitty Entered By: Sandre Kitty on  10/16/2020 09:40:08

## 2020-10-31 ENCOUNTER — Inpatient Hospital Stay: Payer: Medicare Other | Attending: Hematology

## 2020-10-31 ENCOUNTER — Other Ambulatory Visit: Payer: Self-pay

## 2020-10-31 ENCOUNTER — Inpatient Hospital Stay: Payer: Medicare Other

## 2020-10-31 DIAGNOSIS — D45 Polycythemia vera: Secondary | ICD-10-CM | POA: Diagnosis not present

## 2020-10-31 DIAGNOSIS — D500 Iron deficiency anemia secondary to blood loss (chronic): Secondary | ICD-10-CM

## 2020-10-31 DIAGNOSIS — Z7689 Persons encountering health services in other specified circumstances: Secondary | ICD-10-CM | POA: Diagnosis not present

## 2020-10-31 LAB — CBC WITH DIFFERENTIAL (CANCER CENTER ONLY)
Abs Immature Granulocytes: 0.96 10*3/uL — ABNORMAL HIGH (ref 0.00–0.07)
Basophils Absolute: 0.4 10*3/uL — ABNORMAL HIGH (ref 0.0–0.1)
Basophils Relative: 1 %
Eosinophils Absolute: 0.7 10*3/uL — ABNORMAL HIGH (ref 0.0–0.5)
Eosinophils Relative: 3 %
HCT: 45.9 % (ref 36.0–46.0)
Hemoglobin: 13.3 g/dL (ref 12.0–15.0)
Immature Granulocytes: 4 %
Lymphocytes Relative: 5 %
Lymphs Abs: 1.3 10*3/uL (ref 0.7–4.0)
MCH: 21.3 pg — ABNORMAL LOW (ref 26.0–34.0)
MCHC: 29 g/dL — ABNORMAL LOW (ref 30.0–36.0)
MCV: 73.4 fL — ABNORMAL LOW (ref 80.0–100.0)
Monocytes Absolute: 0.6 10*3/uL (ref 0.1–1.0)
Monocytes Relative: 3 %
Neutro Abs: 20.3 10*3/uL — ABNORMAL HIGH (ref 1.7–7.7)
Neutrophils Relative %: 84 %
Platelet Count: 473 10*3/uL — ABNORMAL HIGH (ref 150–400)
RBC: 6.25 MIL/uL — ABNORMAL HIGH (ref 3.87–5.11)
RDW: 20.8 % — ABNORMAL HIGH (ref 11.5–15.5)
WBC Count: 24.2 10*3/uL — ABNORMAL HIGH (ref 4.0–10.5)
nRBC: 0.3 % — ABNORMAL HIGH (ref 0.0–0.2)

## 2020-10-31 LAB — IRON AND TIBC
Iron: 20 ug/dL — ABNORMAL LOW (ref 41–142)
Saturation Ratios: 5 % — ABNORMAL LOW (ref 21–57)
TIBC: 443 ug/dL (ref 236–444)
UIBC: 422 ug/dL — ABNORMAL HIGH (ref 120–384)

## 2020-10-31 LAB — FERRITIN: Ferritin: 22 ng/mL (ref 11–307)

## 2020-10-31 NOTE — Patient Instructions (Signed)

## 2020-10-31 NOTE — Progress Notes (Signed)
[name] presents today for phlebotomy per MD orders. Phlebotomy procedure started at 1130 and ended at 1139. 519 grams removed. 16 G IV needle to the LAC removed intact. Patient tolerated procedure well. Patient provided food and drink. Patient observed for 30 minutes after procedure without any incident.

## 2020-11-06 ENCOUNTER — Encounter (HOSPITAL_BASED_OUTPATIENT_CLINIC_OR_DEPARTMENT_OTHER): Payer: Medicare Other | Admitting: Physician Assistant

## 2020-11-12 ENCOUNTER — Other Ambulatory Visit: Payer: Self-pay | Admitting: Cardiovascular Disease

## 2020-11-12 ENCOUNTER — Other Ambulatory Visit: Payer: Self-pay | Admitting: Family Medicine

## 2020-11-12 DIAGNOSIS — I214 Non-ST elevation (NSTEMI) myocardial infarction: Secondary | ICD-10-CM

## 2020-11-12 DIAGNOSIS — Z8673 Personal history of transient ischemic attack (TIA), and cerebral infarction without residual deficits: Secondary | ICD-10-CM

## 2020-11-12 DIAGNOSIS — F411 Generalized anxiety disorder: Secondary | ICD-10-CM

## 2020-11-28 ENCOUNTER — Inpatient Hospital Stay: Payer: Medicare Other

## 2020-11-28 DIAGNOSIS — Z7689 Persons encountering health services in other specified circumstances: Secondary | ICD-10-CM | POA: Diagnosis not present

## 2020-12-04 ENCOUNTER — Encounter: Payer: Self-pay | Admitting: Podiatry

## 2020-12-04 ENCOUNTER — Other Ambulatory Visit: Payer: Self-pay

## 2020-12-04 ENCOUNTER — Ambulatory Visit (INDEPENDENT_AMBULATORY_CARE_PROVIDER_SITE_OTHER): Payer: Medicare Other | Admitting: Podiatry

## 2020-12-04 DIAGNOSIS — Z89611 Acquired absence of right leg above knee: Secondary | ICD-10-CM | POA: Diagnosis not present

## 2020-12-04 DIAGNOSIS — B351 Tinea unguium: Secondary | ICD-10-CM | POA: Diagnosis not present

## 2020-12-04 DIAGNOSIS — I739 Peripheral vascular disease, unspecified: Secondary | ICD-10-CM | POA: Diagnosis not present

## 2020-12-04 DIAGNOSIS — Z7689 Persons encountering health services in other specified circumstances: Secondary | ICD-10-CM | POA: Diagnosis not present
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
## 2020-12-06 ENCOUNTER — Inpatient Hospital Stay: Payer: Medicare Other | Attending: Hematology

## 2020-12-06 ENCOUNTER — Inpatient Hospital Stay: Payer: Medicare Other

## 2020-12-06 ENCOUNTER — Other Ambulatory Visit: Payer: Self-pay

## 2020-12-06 DIAGNOSIS — I4891 Unspecified atrial fibrillation: Secondary | ICD-10-CM | POA: Insufficient documentation

## 2020-12-06 DIAGNOSIS — D45 Polycythemia vera: Secondary | ICD-10-CM

## 2020-12-06 DIAGNOSIS — Z7689 Persons encountering health services in other specified circumstances: Secondary | ICD-10-CM | POA: Diagnosis not present

## 2020-12-06 DIAGNOSIS — D696 Thrombocytopenia, unspecified: Secondary | ICD-10-CM | POA: Diagnosis not present

## 2020-12-06 DIAGNOSIS — D500 Iron deficiency anemia secondary to blood loss (chronic): Secondary | ICD-10-CM

## 2020-12-06 DIAGNOSIS — D509 Iron deficiency anemia, unspecified: Secondary | ICD-10-CM | POA: Diagnosis not present

## 2020-12-06 DIAGNOSIS — Z79899 Other long term (current) drug therapy: Secondary | ICD-10-CM | POA: Insufficient documentation

## 2020-12-06 DIAGNOSIS — Z86718 Personal history of other venous thrombosis and embolism: Secondary | ICD-10-CM | POA: Diagnosis not present

## 2020-12-06 LAB — CBC WITH DIFFERENTIAL (CANCER CENTER ONLY)
Abs Immature Granulocytes: 1.19 10*3/uL — ABNORMAL HIGH (ref 0.00–0.07)
Basophils Absolute: 0.3 10*3/uL — ABNORMAL HIGH (ref 0.0–0.1)
Basophils Relative: 1 %
Eosinophils Absolute: 0.6 10*3/uL — ABNORMAL HIGH (ref 0.0–0.5)
Eosinophils Relative: 3 %
HCT: 37.7 % (ref 36.0–46.0)
Hemoglobin: 10.6 g/dL — ABNORMAL LOW (ref 12.0–15.0)
Immature Granulocytes: 6 %
Lymphocytes Relative: 6 %
Lymphs Abs: 1.2 10*3/uL (ref 0.7–4.0)
MCH: 19.7 pg — ABNORMAL LOW (ref 26.0–34.0)
MCHC: 28.1 g/dL — ABNORMAL LOW (ref 30.0–36.0)
MCV: 70.1 fL — ABNORMAL LOW (ref 80.0–100.0)
Monocytes Absolute: 0.6 10*3/uL (ref 0.1–1.0)
Monocytes Relative: 3 %
Neutro Abs: 17.6 10*3/uL — ABNORMAL HIGH (ref 1.7–7.7)
Neutrophils Relative %: 81 %
Platelet Count: 435 10*3/uL — ABNORMAL HIGH (ref 150–400)
RBC: 5.38 MIL/uL — ABNORMAL HIGH (ref 3.87–5.11)
RDW: 21.6 % — ABNORMAL HIGH (ref 11.5–15.5)
WBC Count: 21.5 10*3/uL — ABNORMAL HIGH (ref 4.0–10.5)
nRBC: 0.5 % — ABNORMAL HIGH (ref 0.0–0.2)

## 2020-12-06 LAB — IRON AND TIBC
Iron: 14 ug/dL — ABNORMAL LOW (ref 41–142)
Saturation Ratios: 3 % — ABNORMAL LOW (ref 21–57)
TIBC: 484 ug/dL — ABNORMAL HIGH (ref 236–444)
UIBC: 470 ug/dL — ABNORMAL HIGH (ref 120–384)

## 2020-12-06 LAB — FERRITIN: Ferritin: 25 ng/mL (ref 11–307)

## 2020-12-06 NOTE — Progress Notes (Signed)
Pt's Hct today is 37.7.  According to MD note from 08/28/20 no phlebotomy necessary.  Pt informed.

## 2020-12-12 ENCOUNTER — Other Ambulatory Visit: Payer: Self-pay | Admitting: Family Medicine

## 2020-12-12 DIAGNOSIS — F411 Generalized anxiety disorder: Secondary | ICD-10-CM
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
## 2020-12-26 ENCOUNTER — Inpatient Hospital Stay: Payer: 59 | Attending: Hematology

## 2020-12-26 ENCOUNTER — Inpatient Hospital Stay (HOSPITAL_BASED_OUTPATIENT_CLINIC_OR_DEPARTMENT_OTHER): Payer: 59 | Admitting: Hematology

## 2020-12-26 ENCOUNTER — Other Ambulatory Visit: Payer: Self-pay

## 2020-12-26 ENCOUNTER — Encounter: Payer: Self-pay | Admitting: Hematology

## 2020-12-26 ENCOUNTER — Telehealth: Payer: Self-pay | Admitting: Hematology

## 2020-12-26 ENCOUNTER — Inpatient Hospital Stay: Payer: 59

## 2020-12-26 VITALS — BP 112/74 | HR 72 | Temp 97.4°F | Resp 18 | Ht 65.5 in | Wt 139.3 lb

## 2020-12-26 DIAGNOSIS — D45 Polycythemia vera: Secondary | ICD-10-CM

## 2020-12-26 DIAGNOSIS — D500 Iron deficiency anemia secondary to blood loss (chronic): Secondary | ICD-10-CM

## 2020-12-26 LAB — CBC WITH DIFFERENTIAL (CANCER CENTER ONLY)
Abs Immature Granulocytes: 0.87 10*3/uL — ABNORMAL HIGH (ref 0.00–0.07)
Basophils Absolute: 0.2 10*3/uL — ABNORMAL HIGH (ref 0.0–0.1)
Basophils Relative: 1 %
Eosinophils Absolute: 0.6 10*3/uL — ABNORMAL HIGH (ref 0.0–0.5)
Eosinophils Relative: 4 %
HCT: 34.7 % — ABNORMAL LOW (ref 36.0–46.0)
Hemoglobin: 9.8 g/dL — ABNORMAL LOW (ref 12.0–15.0)
Immature Granulocytes: 5 %
Lymphocytes Relative: 6 %
Lymphs Abs: 1.1 10*3/uL (ref 0.7–4.0)
MCH: 19.3 pg — ABNORMAL LOW (ref 26.0–34.0)
MCHC: 28.2 g/dL — ABNORMAL LOW (ref 30.0–36.0)
MCV: 68.2 fL — ABNORMAL LOW (ref 80.0–100.0)
Monocytes Absolute: 0.6 10*3/uL (ref 0.1–1.0)
Monocytes Relative: 3 %
Neutro Abs: 14.7 10*3/uL — ABNORMAL HIGH (ref 1.7–7.7)
Neutrophils Relative %: 81 %
Platelet Count: 443 10*3/uL — ABNORMAL HIGH (ref 150–400)
RBC: 5.09 MIL/uL (ref 3.87–5.11)
RDW: 21.3 % — ABNORMAL HIGH (ref 11.5–15.5)
WBC Count: 18.1 10*3/uL — ABNORMAL HIGH (ref 4.0–10.5)
nRBC: 0.4 % — ABNORMAL HIGH (ref 0.0–0.2)

## 2020-12-26 LAB — IRON AND TIBC
Iron: 16 ug/dL — ABNORMAL LOW (ref 41–142)
Saturation Ratios: 4 % — ABNORMAL LOW (ref 21–57)
TIBC: 428 ug/dL (ref 236–444)
UIBC: 412 ug/dL — ABNORMAL HIGH (ref 120–384)

## 2020-12-26 LAB — FERRITIN: Ferritin: 33 ng/mL (ref 11–307)

## 2020-12-26 NOTE — Progress Notes (Signed)
Per Dr. Mosetta Putt, hold phlebotomy today due to low hemoglobin.

## 2020-12-26 NOTE — Telephone Encounter (Signed)
Scheduled appointments per 1/6 los. Patient aware of time and dates.

## 2021-01-16 ENCOUNTER — Other Ambulatory Visit: Payer: Self-pay | Admitting: *Deleted

## 2021-01-16 DIAGNOSIS — F411 Generalized anxiety disorder: Secondary | ICD-10-CM

## 2021-01-16 NOTE — Telephone Encounter (Signed)
Je. NTBS 30 days given 12/12/20

## 2021-01-22 ENCOUNTER — Ambulatory Visit (INDEPENDENT_AMBULATORY_CARE_PROVIDER_SITE_OTHER): Payer: 59 | Admitting: Nurse Practitioner

## 2021-01-22 ENCOUNTER — Other Ambulatory Visit: Payer: Self-pay

## 2021-01-22 DIAGNOSIS — E782 Mixed hyperlipidemia: Secondary | ICD-10-CM | POA: Diagnosis not present

## 2021-01-22 DIAGNOSIS — M1712 Unilateral primary osteoarthritis, left knee: Secondary | ICD-10-CM | POA: Diagnosis not present

## 2021-01-22 DIAGNOSIS — M810 Age-related osteoporosis without current pathological fracture: Secondary | ICD-10-CM

## 2021-01-22 DIAGNOSIS — Z8673 Personal history of transient ischemic attack (TIA), and cerebral infarction without residual deficits: Secondary | ICD-10-CM

## 2021-01-22 DIAGNOSIS — F339 Major depressive disorder, recurrent, unspecified: Secondary | ICD-10-CM

## 2021-01-22 DIAGNOSIS — I482 Chronic atrial fibrillation, unspecified: Secondary | ICD-10-CM

## 2021-01-22 DIAGNOSIS — I10 Essential (primary) hypertension: Secondary | ICD-10-CM

## 2021-01-22 DIAGNOSIS — K219 Gastro-esophageal reflux disease without esophagitis: Secondary | ICD-10-CM

## 2021-01-22 DIAGNOSIS — I214 Non-ST elevation (NSTEMI) myocardial infarction: Secondary | ICD-10-CM | POA: Diagnosis not present

## 2021-01-22 MED ORDER — CHLORTHALIDONE 25 MG PO TABS: 25.0000 mg | ORAL_TABLET | Freq: Every day | ORAL | 5 refills | Status: DC

## 2021-01-22 MED ORDER — ALENDRONATE SODIUM 70 MG PO TABS: ORAL_TABLET | ORAL | 2 refills | Status: DC

## 2021-01-22 MED ORDER — APIXABAN 5 MG PO TABS: 5.0000 mg | ORAL_TABLET | Freq: Two times a day (BID) | ORAL | 2 refills | Status: DC

## 2021-01-22 MED ORDER — EZETIMIBE 10 MG PO TABS: 10.0000 mg | ORAL_TABLET | Freq: Every day | ORAL | 0 refills | Status: DC

## 2021-01-22 MED ORDER — OMEPRAZOLE 20 MG PO CPDR: DELAYED_RELEASE_CAPSULE | ORAL | 5 refills | Status: DC

## 2021-01-22 MED ORDER — VALSARTAN 160 MG PO TABS: 160.0000 mg | ORAL_TABLET | Freq: Two times a day (BID) | ORAL | 0 refills | Status: DC

## 2021-01-22 MED ORDER — FENOFIBRATE 145 MG PO TABS: 145.0000 mg | ORAL_TABLET | Freq: Every day | ORAL | 0 refills | Status: DC

## 2021-01-22 NOTE — Assessment & Plan Note (Signed)
Blood pressures well controlled on medication no changes to current dose. Continue on low-sodium diet and exercise regimen as tolerated.

## 2021-01-22 NOTE — Assessment & Plan Note (Signed)
DEXA scan completed results pending.

## 2021-01-22 NOTE — Assessment & Plan Note (Signed)
Well maintained on current medication.

## 2021-01-22 NOTE — Assessment & Plan Note (Addendum)
Depression not well controlled.  Completed PHQ-9.  Education provided with printed handouts given.  Increase Lexapro to 20 mg tablet daily.  Follow-up in 4 to 6 weeks. Rx sent to pharmacy.

## 2021-01-22 NOTE — Assessment & Plan Note (Signed)
Lipid panel labs completed results pending.

## 2021-01-22 NOTE — Progress Notes (Addendum)
Virtual Visit via telephone Note Due to COVID-19 pandemic this visit was conducted virtually. This visit type was conducted due to national recommendations for restrictions regarding the COVID-19 Pandemic (e.g. social distancing, sheltering in place) in an effort to limit this patient's exposure and mitigate transmission in our community. All issues noted in this document were discussed and addressed.  A physical exam was not performed with this format.  I connected with Reeanna B Mcclintock on 02/03/21 at 10:30 am by telephone and verified that I am speaking with the correct person using two identifiers. Andrew B Cozza is currently located at home during visit. The provider, Ivy Lynn, NP is located in their office at time of visit.  I discussed the limitations, risks, security and privacy concerns of performing an evaluation and management service by telephone and the availability of in person appointments. I also discussed with the patient that there may be a patient responsible charge related to this service. The patient expressed understanding and agreed to proceed.   History and Present Illness:  HPI  Pt presents for follow up of hypertension. Patient was diagnosed in 01/08/2016. The patient is tolerating the medication well without side effects. Compliance with treatment has been good; including taking medication as directed , maintains a healthy diet and regular exercise regimen , and following up as directed.  Current medications doxazosin 240 mg tablet daily.  Mixed hyperlipidemia  Pt presents with hyperlipidemia. Patient was diagnosed in  Compliance 04/27/2013 with treatment has been good; The patient is compliant with medications, maintains a low cholesterol diet , follows up as directed , and maintains an exercise regimen . The patient denies experiencing any hypercholesterolemia related symptoms.  Current medication fenofibrate 145 mg tablet, Zetia 10 mg tablet daily.  Depression:  Patient complains of depression. She complains of depressed mood, difficulty concentrating and hopelessness. Onset was approximately a few months ago, gradually worsening since that time.  She denies current suicidal and homicidal plan or intent.   Family history significant for no psychiatric illness.Possible organic causes contributing are: none.  Risk factors: none Previous treatment includes Lexapro. She complains of the following side effects from the treatment: none.  ROS   Observations/Objective: Telephone visit patient does not sound to be in distress.  Assessment and Plan: Depression, recurrent ([place]) Depression not well controlled.  Completed PHQ-9.  Education provided with printed handouts given.  Increase Lexapro to 20 mg tablet daily.  Follow-up in 4 to 6 weeks. Rx sent to pharmacy.  Hyperlipidemia Lipid panel labs completed results pending.  Osteoarthritis of left knee DEXA scan completed results pending.  Gastroesophageal reflux disease without esophagitis Well maintained on current medication.  Benign essential HTN Blood pressures well controlled on medication no changes to current dose. Continue on low-sodium diet and exercise regimen as tolerated.  [place] Office Visit from 01/22/2021 in [place]  PHQ-9 Total Score 9       Follow Up Instructions: Follow-up in 3 months.    I discussed the assessment and treatment plan with the patient. The patient was provided an opportunity to ask questions and all were answered. The patient agreed with the plan and demonstrated an understanding of the instructions.   The patient was advised to call back or seek an in-person evaluation if the symptoms worsen or if the condition fails to improve as anticipated.  The above assessment and management plan was discussed with the patient. The patient verbalized understanding of and has agreed to the management  plan. Patient is aware to call the clinic  if symptoms persist or worsen. Patient is aware when to return to the clinic for a follow-up visit. Patient educated on when it is appropriate to go to the emergency department.   Time call ended:  10:42 AM  I provided 12 minutes of non-face-to-face time during this encounter.    Ivy Lynn, NP

## 2021-01-22 NOTE — Assessment & Plan Note (Signed)
>>  ASSESSMENT AND PLAN FOR MIXED HYPERLIPIDEMIA WRITTEN ON 01/22/2021  2:04 PM BY IJAOLA, ONYEJE M, NP  Lipid panel labs completed results pending.

## 2021-01-23 ENCOUNTER — Other Ambulatory Visit: Payer: Self-pay

## 2021-01-23 ENCOUNTER — Inpatient Hospital Stay: Payer: 59 | Attending: Hematology

## 2021-01-23 DIAGNOSIS — D500 Iron deficiency anemia secondary to blood loss (chronic): Secondary | ICD-10-CM

## 2021-01-23 DIAGNOSIS — D509 Iron deficiency anemia, unspecified: Secondary | ICD-10-CM | POA: Insufficient documentation

## 2021-01-23 DIAGNOSIS — E509 Vitamin A deficiency, unspecified: Secondary | ICD-10-CM | POA: Diagnosis present

## 2021-01-23 DIAGNOSIS — D45 Polycythemia vera: Secondary | ICD-10-CM | POA: Insufficient documentation

## 2021-01-23 LAB — CBC WITH DIFFERENTIAL (CANCER CENTER ONLY)
Abs Immature Granulocytes: 1.07 10*3/uL — ABNORMAL HIGH (ref 0.00–0.07)
Basophils Absolute: 0.2 10*3/uL — ABNORMAL HIGH (ref 0.0–0.1)
Basophils Relative: 2 %
Eosinophils Absolute: 0.4 10*3/uL (ref 0.0–0.5)
Eosinophils Relative: 3 %
HCT: 44.6 % (ref 36.0–46.0)
Hemoglobin: 12.7 g/dL (ref 12.0–15.0)
Immature Granulocytes: 7 %
Lymphocytes Relative: 7 %
Lymphs Abs: 1 10*3/uL (ref 0.7–4.0)
MCH: 20.4 pg — ABNORMAL LOW (ref 26.0–34.0)
MCHC: 28.5 g/dL — ABNORMAL LOW (ref 30.0–36.0)
MCV: 71.5 fL — ABNORMAL LOW (ref 80.0–100.0)
Monocytes Absolute: 0.4 10*3/uL (ref 0.1–1.0)
Monocytes Relative: 3 %
Neutro Abs: 12 10*3/uL — ABNORMAL HIGH (ref 1.7–7.7)
Neutrophils Relative %: 78 %
Platelet Count: 281 10*3/uL (ref 150–400)
RBC: 6.24 MIL/uL — ABNORMAL HIGH (ref 3.87–5.11)
RDW: 24.8 % — ABNORMAL HIGH (ref 11.5–15.5)
WBC Count: 15.2 10*3/uL — ABNORMAL HIGH (ref 4.0–10.5)
nRBC: 0.8 % — ABNORMAL HIGH (ref 0.0–0.2)

## 2021-01-23 LAB — IRON AND TIBC
Iron: 351 ug/dL — ABNORMAL HIGH (ref 41–142)
Saturation Ratios: 101 % — ABNORMAL HIGH (ref 21–57)
TIBC: 348 ug/dL (ref 236–444)
UIBC: UNDETERMINED ug/dL (ref 120–384)

## 2021-01-27 ENCOUNTER — Other Ambulatory Visit: Payer: Self-pay

## 2021-01-27 ENCOUNTER — Ambulatory Visit (HOSPITAL_COMMUNITY)
Admission: RE | Admit: 2021-01-27 | Discharge: 2021-01-27 | Disposition: A | Discharge status: Home / Self Care | Payer: 59 | Source: Ambulatory Visit | Attending: Hematology | Admitting: Hematology

## 2021-01-27 DIAGNOSIS — D45 Polycythemia vera: Secondary | ICD-10-CM | POA: Insufficient documentation

## 2021-01-27 IMAGING — US US ABDOMEN COMPLETE
1 series · 14 of 25 positions shown · non-contrast
Comparison: [DATE] and prior.

CLINICAL DATA: RUQ tenderness

EXAM:
ABDOMEN ULTRASOUND COMPLETE

[Series 1: us abdomen complete · 14 of 92 slices shown]
[im 1/92]
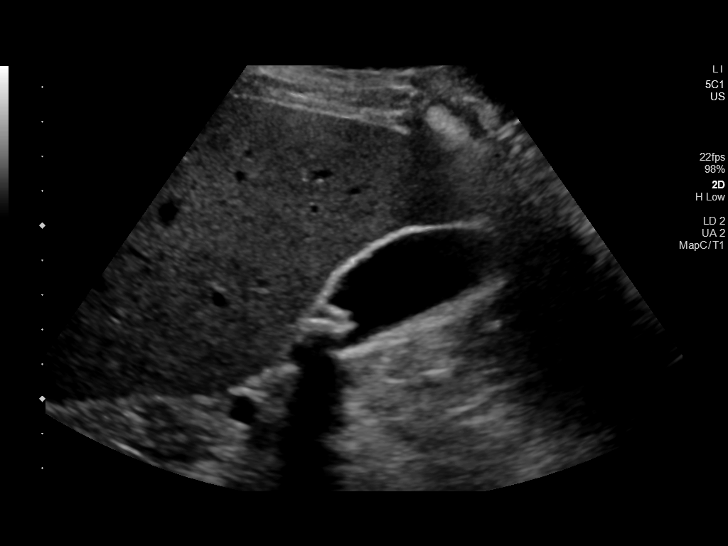
[im 8/92]
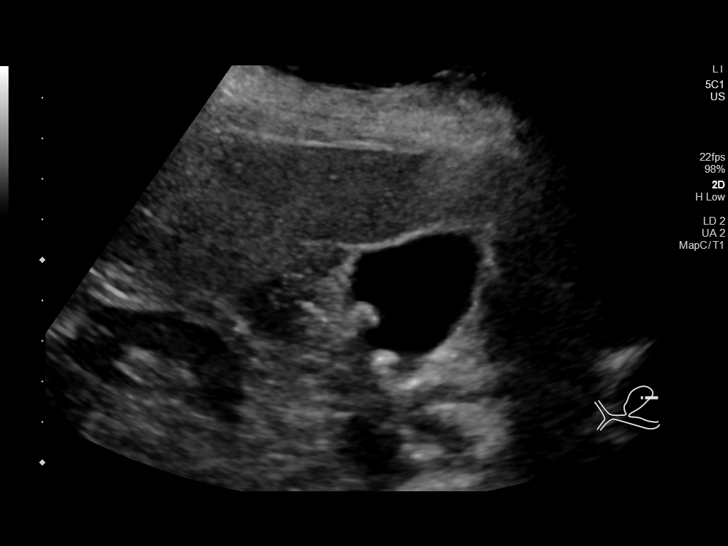
[im 16/92]
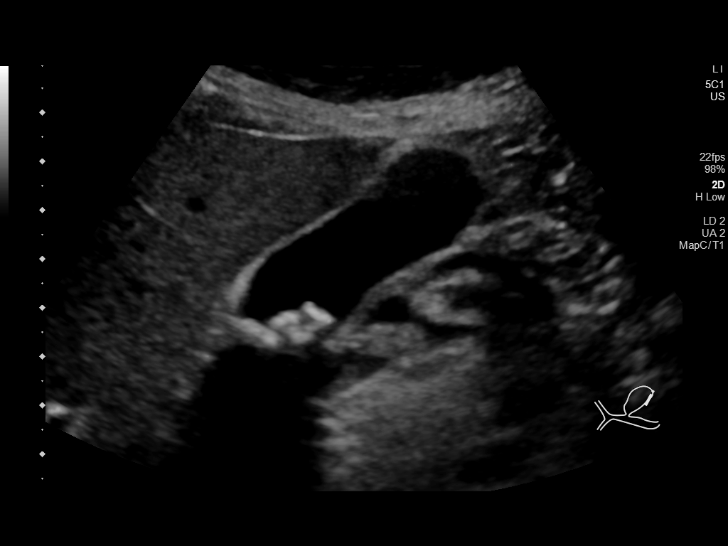
[im 23/92]
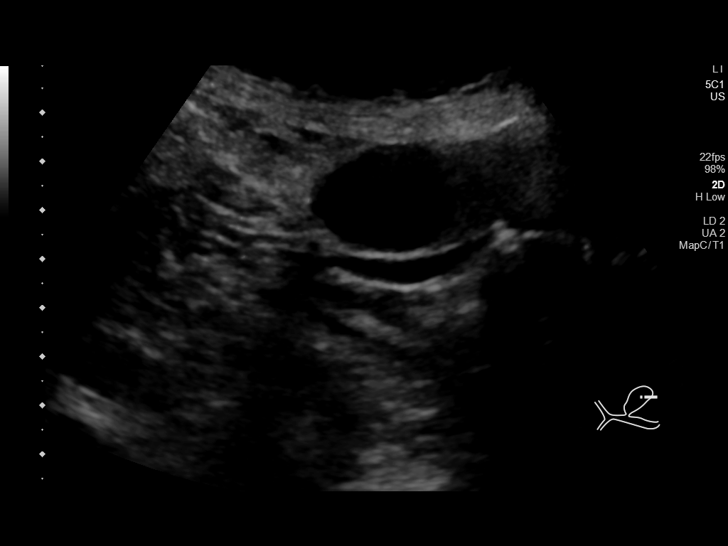
[im 31/92]
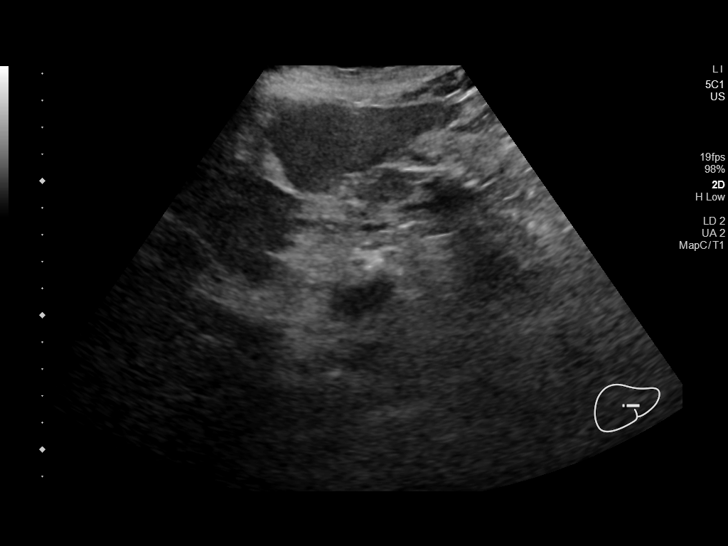
[im 35/92]
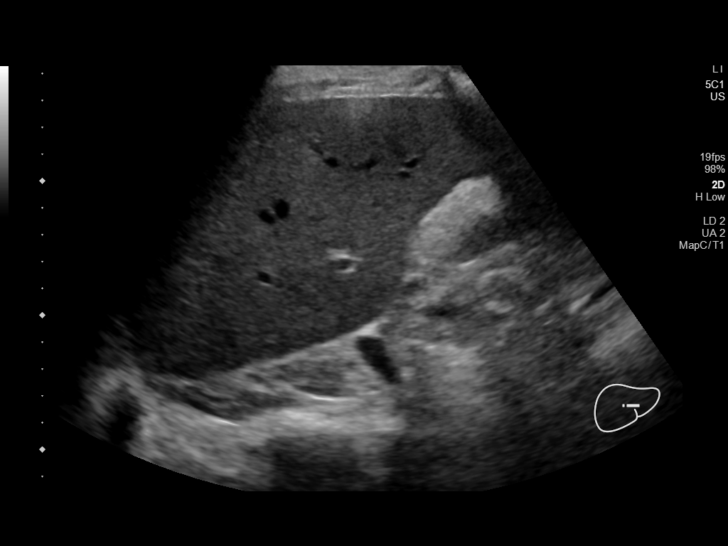
[im 42/92]
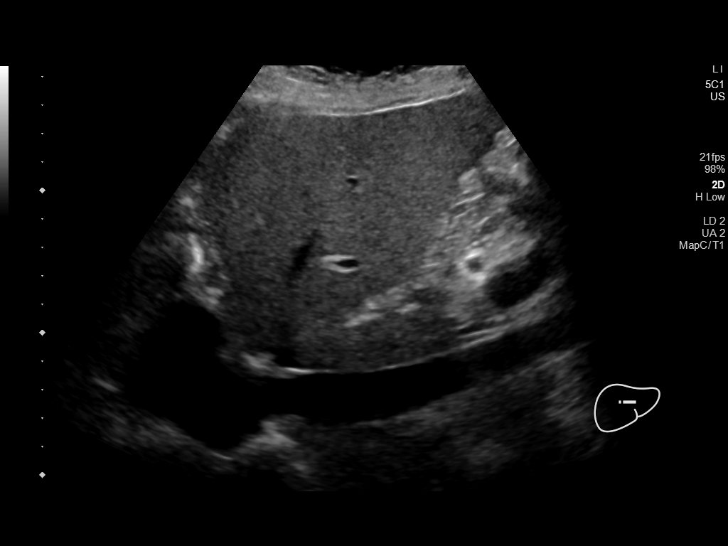
[im 50/92]
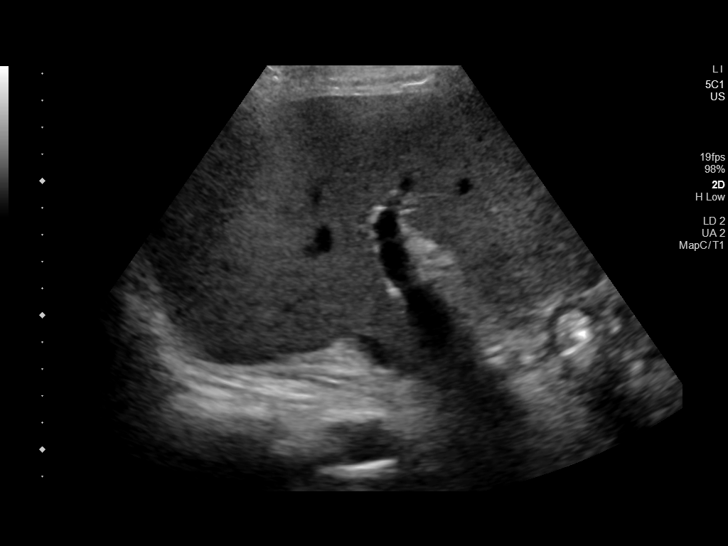
[im 57/92]
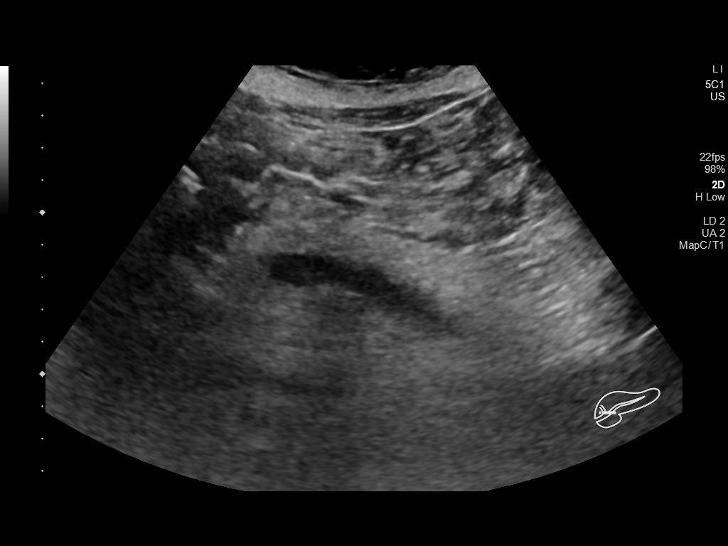
[im 61/92]
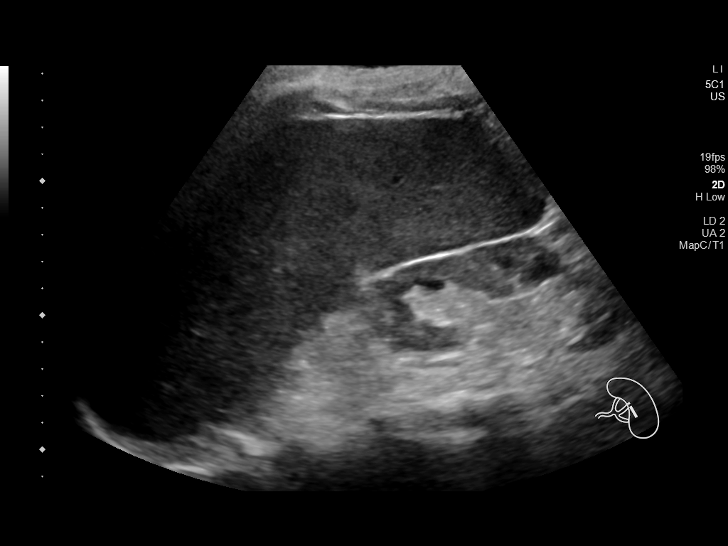
[im 69/92]
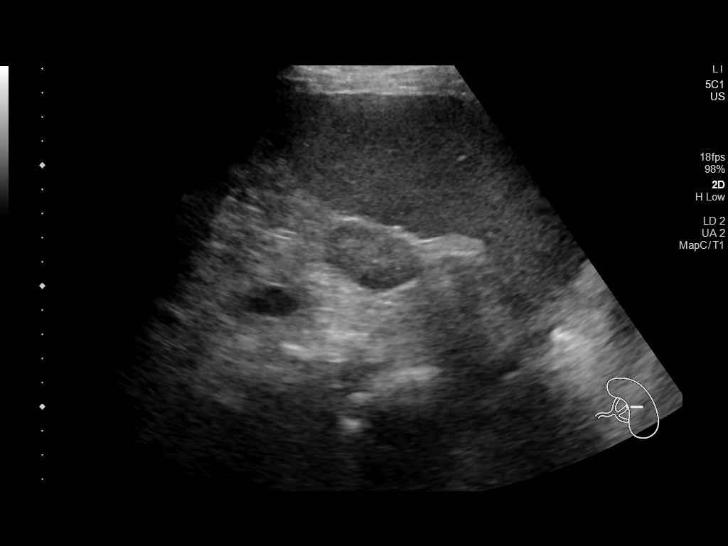
[im 76/92]
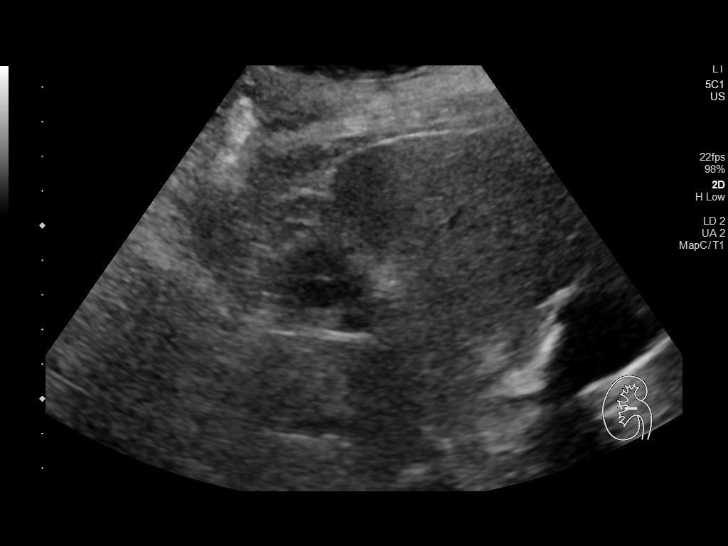
[im 84/92]
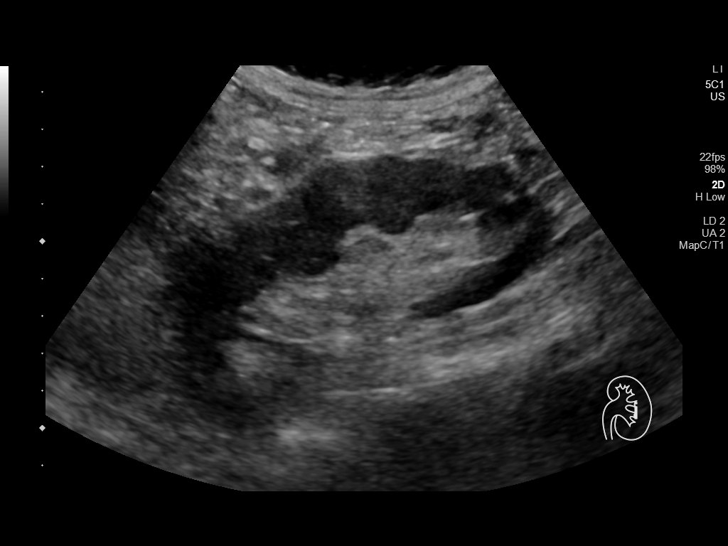
[im 92/92]
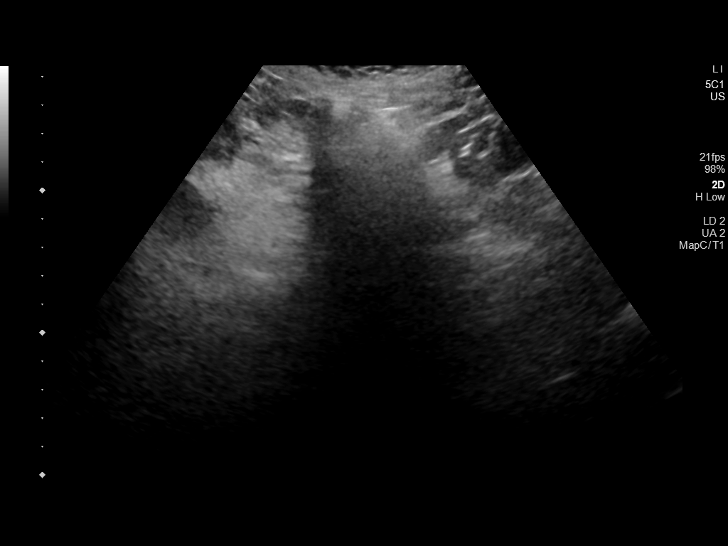

[14 of 25 positions shown; findings below may reference images not displayed]

FINDINGS: Gallbladder: No wall thickening visualized. Intraluminal calculi
measuring up to 1.3 cm. No sonographic Murphy sign noted by
sonographer.

Common bile duct: Diameter: 2.8 mm.

Liver: No focal lesion identified. Mildly increased parenchymal
echogenicity. Portal vein is patent on color Doppler imaging with
normal direction of blood flow towards the liver.

IVC: No abnormality visualized.

Pancreas: Visualized portion unremarkable.

Spleen: Enlarged measuring 19.6 x 18.6 x 8.3 cm with a volume of
[13] mL. Normal echogenicity.

Right Kidney: Length: 10.7 cm. Echogenicity within normal limits. No
mass or hydronephrosis visualized.

Left Kidney: Length: 12.1 cm. Echogenicity within normal limits. No
mass or hydronephrosis visualized.

Abdominal aorta: No aneurysm visualized.

Other findings: None.
IMPRESSION: Cholelithiasis without cholecystitis.

Mild hepatic steatosis.  No focal hepatic lesion.

Marked splenomegaly, likely sequela of polycythemia MANTLE.

## 2021-01-28 ENCOUNTER — Telehealth: Payer: Self-pay

## 2021-01-28 NOTE — Telephone Encounter (Signed)
-----   Message from Truitt Merle, MD sent at 01/28/2021  2:52 PM EST ----- Please let pt know her [place] result, she has gall bladder stone, which is likely the cause of her abdominal pain. Let me know if she wants to see a surgeon if she has frequent RUQ abdominal pain. Thanks  Truitt Merle  01/28/2021

## 2021-01-28 NOTE — Telephone Encounter (Signed)
Called message left stating please return call to receive most recent  Ultra [facility] results

## 2021-02-03 ENCOUNTER — Other Ambulatory Visit: Payer: Self-pay | Admitting: Family Medicine

## 2021-02-03 ENCOUNTER — Telehealth: Payer: Self-pay | Admitting: *Deleted

## 2021-02-03 ENCOUNTER — Other Ambulatory Visit: Payer: Self-pay | Admitting: Nurse Practitioner

## 2021-02-03 DIAGNOSIS — F411 Generalized anxiety disorder: Secondary | ICD-10-CM

## 2021-02-03 MED ORDER — ESCITALOPRAM OXALATE 20 MG PO TABS: 20.0000 mg | ORAL_TABLET | Freq: Every day | ORAL | 0 refills | Status: DC

## 2021-02-03 NOTE — Telephone Encounter (Signed)
Fax from [place]: Escitalopram 10 mg Note from pharmacy: pt had appt, needs RF, pt also said provider increased medicaition Please advise and send new Rx if appropriate

## 2021-02-03 NOTE — Telephone Encounter (Signed)
Patient notified

## 2021-02-03 NOTE — Telephone Encounter (Signed)
Sent new medication dose to pharmacy  for 90 days.
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
## 2021-02-20 ENCOUNTER — Other Ambulatory Visit: Payer: Self-pay

## 2021-02-20 ENCOUNTER — Encounter: Payer: Self-pay | Admitting: Hematology

## 2021-02-20 ENCOUNTER — Telehealth: Payer: Self-pay

## 2021-02-20 ENCOUNTER — Inpatient Hospital Stay (HOSPITAL_BASED_OUTPATIENT_CLINIC_OR_DEPARTMENT_OTHER): Payer: 59 | Admitting: Hematology

## 2021-02-20 ENCOUNTER — Inpatient Hospital Stay: Payer: 59 | Attending: Hematology

## 2021-02-20 VITALS — BP 137/76 | HR 79 | Temp 97.9°F | Resp 18 | Ht 65.5 in | Wt 134.9 lb

## 2021-02-20 DIAGNOSIS — Z89611 Acquired absence of right leg above knee: Secondary | ICD-10-CM | POA: Insufficient documentation

## 2021-02-20 DIAGNOSIS — Z8673 Personal history of transient ischemic attack (TIA), and cerebral infarction without residual deficits: Secondary | ICD-10-CM | POA: Insufficient documentation

## 2021-02-20 DIAGNOSIS — D509 Iron deficiency anemia, unspecified: Secondary | ICD-10-CM | POA: Insufficient documentation

## 2021-02-20 DIAGNOSIS — I10 Essential (primary) hypertension: Secondary | ICD-10-CM | POA: Insufficient documentation

## 2021-02-20 DIAGNOSIS — Z79899 Other long term (current) drug therapy: Secondary | ICD-10-CM | POA: Diagnosis not present

## 2021-02-20 DIAGNOSIS — I252 Old myocardial infarction: Secondary | ICD-10-CM | POA: Diagnosis not present

## 2021-02-20 DIAGNOSIS — Z7982 Long term (current) use of aspirin: Secondary | ICD-10-CM | POA: Diagnosis not present

## 2021-02-20 DIAGNOSIS — I480 Paroxysmal atrial fibrillation: Secondary | ICD-10-CM | POA: Diagnosis not present

## 2021-02-20 DIAGNOSIS — D45 Polycythemia vera: Secondary | ICD-10-CM

## 2021-02-20 DIAGNOSIS — D500 Iron deficiency anemia secondary to blood loss (chronic): Secondary | ICD-10-CM

## 2021-02-20 DIAGNOSIS — Z7952 Long term (current) use of systemic steroids: Secondary | ICD-10-CM | POA: Insufficient documentation

## 2021-02-20 DIAGNOSIS — Z86718 Personal history of other venous thrombosis and embolism: Secondary | ICD-10-CM | POA: Insufficient documentation

## 2021-02-20 DIAGNOSIS — Z7901 Long term (current) use of anticoagulants: Secondary | ICD-10-CM | POA: Diagnosis not present

## 2021-02-20 LAB — CBC WITH DIFFERENTIAL (CANCER CENTER ONLY)
Abs Immature Granulocytes: 2.63 10*3/uL — ABNORMAL HIGH (ref 0.00–0.07)
Basophils Absolute: 0.5 10*3/uL — ABNORMAL HIGH (ref 0.0–0.1)
Basophils Relative: 2 %
Eosinophils Absolute: 0.7 10*3/uL — ABNORMAL HIGH (ref 0.0–0.5)
Eosinophils Relative: 2 %
HCT: 48.5 % — ABNORMAL HIGH (ref 36.0–46.0)
Hemoglobin: 13.8 g/dL (ref 12.0–15.0)
Immature Granulocytes: 10 %
Lymphocytes Relative: 5 %
Lymphs Abs: 1.4 10*3/uL (ref 0.7–4.0)
MCH: 20.8 pg — ABNORMAL LOW (ref 26.0–34.0)
MCHC: 28.5 g/dL — ABNORMAL LOW (ref 30.0–36.0)
MCV: 72.9 fL — ABNORMAL LOW (ref 80.0–100.0)
Monocytes Absolute: 1 10*3/uL (ref 0.1–1.0)
Monocytes Relative: 4 %
Neutro Abs: 20.9 10*3/uL — ABNORMAL HIGH (ref 1.7–7.7)
Neutrophils Relative %: 77 %
Platelet Count: 379 10*3/uL (ref 150–400)
RBC: 6.65 MIL/uL — ABNORMAL HIGH (ref 3.87–5.11)
RDW: 23.6 % — ABNORMAL HIGH (ref 11.5–15.5)
WBC Count: 27 10*3/uL — ABNORMAL HIGH (ref 4.0–10.5)
nRBC: 0.6 % — ABNORMAL HIGH (ref 0.0–0.2)

## 2021-02-20 LAB — IRON AND TIBC
Iron: 14 ug/dL — ABNORMAL LOW (ref 41–142)
Saturation Ratios: 3 % — ABNORMAL LOW (ref 21–57)
TIBC: 451 ug/dL — ABNORMAL HIGH (ref 236–444)
UIBC: 437 ug/dL — ABNORMAL HIGH (ref 120–384)

## 2021-02-20 LAB — FERRITIN: Ferritin: 62 ng/mL (ref 11–307)

## 2021-02-20 MED ORDER — HYDROXYUREA 500 MG PO CAPS: ORAL_CAPSULE | ORAL | 3 refills | Status: DC

## 2021-02-20 NOTE — Telephone Encounter (Signed)
I left vm for Ms Haraway to return my call.  We need to see if she is willing to do home peginterferon injections  Ms Pistilli returned my call.  She states she can not give her self injections.  She would like to restart Hydrea.

## 2021-02-21 ENCOUNTER — Telehealth: Payer: Self-pay | Admitting: Hematology

## 2021-02-21 NOTE — Telephone Encounter (Signed)
Cancelled injection appointments per 3/3 staff message. Patient is aware of changes.

## 2021-02-27 ENCOUNTER — Ambulatory Visit: Payer: 59

## 2021-03-05 ENCOUNTER — Ambulatory Visit: Payer: Medicare Other | Admitting: Podiatry

## 2021-03-06 ENCOUNTER — Inpatient Hospital Stay: Payer: 59

## 2021-03-06 ENCOUNTER — Ambulatory Visit: Payer: 59

## 2021-03-06 ENCOUNTER — Other Ambulatory Visit: Payer: Self-pay

## 2021-03-06 DIAGNOSIS — D45 Polycythemia vera: Secondary | ICD-10-CM | POA: Diagnosis not present

## 2021-03-06 DIAGNOSIS — D500 Iron deficiency anemia secondary to blood loss (chronic): Secondary | ICD-10-CM

## 2021-03-06 LAB — CBC WITH DIFFERENTIAL (CANCER CENTER ONLY)
Abs Immature Granulocytes: 1.27 10*3/uL — ABNORMAL HIGH (ref 0.00–0.07)
Basophils Absolute: 0.4 10*3/uL — ABNORMAL HIGH (ref 0.0–0.1)
Basophils Relative: 2 %
Eosinophils Absolute: 0.7 10*3/uL — ABNORMAL HIGH (ref 0.0–0.5)
Eosinophils Relative: 3 %
HCT: 46.3 % — ABNORMAL HIGH (ref 36.0–46.0)
Hemoglobin: 13.6 g/dL (ref 12.0–15.0)
Immature Granulocytes: 6 %
Lymphocytes Relative: 5 %
Lymphs Abs: 1.2 10*3/uL (ref 0.7–4.0)
MCH: 21.1 pg — ABNORMAL LOW (ref 26.0–34.0)
MCHC: 29.4 g/dL — ABNORMAL LOW (ref 30.0–36.0)
MCV: 71.8 fL — ABNORMAL LOW (ref 80.0–100.0)
Monocytes Absolute: 0.6 10*3/uL (ref 0.1–1.0)
Monocytes Relative: 3 %
Neutro Abs: 18 10*3/uL — ABNORMAL HIGH (ref 1.7–7.7)
Neutrophils Relative %: 81 %
Platelet Count: 237 10*3/uL (ref 150–400)
RBC: 6.45 MIL/uL — ABNORMAL HIGH (ref 3.87–5.11)
RDW: 21.9 % — ABNORMAL HIGH (ref 11.5–15.5)
WBC Count: 22.1 10*3/uL — ABNORMAL HIGH (ref 4.0–10.5)
nRBC: 0.4 % — ABNORMAL HIGH (ref 0.0–0.2)

## 2021-03-06 LAB — IRON AND TIBC
Iron: 19 ug/dL — ABNORMAL LOW (ref 41–142)
Saturation Ratios: 4 % — ABNORMAL LOW (ref 21–57)
TIBC: 485 ug/dL — ABNORMAL HIGH (ref 236–444)
UIBC: 466 ug/dL — ABNORMAL HIGH (ref 120–384)

## 2021-03-06 LAB — FERRITIN: Ferritin: 33 ng/mL (ref 11–307)

## 2021-03-13 ENCOUNTER — Ambulatory Visit: Payer: 59
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
## 2021-03-20 ENCOUNTER — Other Ambulatory Visit: Payer: Self-pay

## 2021-03-20 ENCOUNTER — Ambulatory Visit: Payer: 59

## 2021-03-20 ENCOUNTER — Inpatient Hospital Stay (HOSPITAL_BASED_OUTPATIENT_CLINIC_OR_DEPARTMENT_OTHER): Payer: 59 | Admitting: Hematology

## 2021-03-20 ENCOUNTER — Inpatient Hospital Stay: Payer: 59

## 2021-03-20 ENCOUNTER — Encounter: Payer: Self-pay | Admitting: Hematology

## 2021-03-20 VITALS — BP 103/68 | HR 74 | Temp 98.0°F | Resp 15 | Ht 65.5 in | Wt 134.9 lb

## 2021-03-20 DIAGNOSIS — D45 Polycythemia vera: Secondary | ICD-10-CM | POA: Diagnosis not present

## 2021-03-20 DIAGNOSIS — D500 Iron deficiency anemia secondary to blood loss (chronic): Secondary | ICD-10-CM

## 2021-03-20 LAB — IRON AND TIBC
Iron: 25 ug/dL — ABNORMAL LOW (ref 41–142)
Saturation Ratios: 5 % — ABNORMAL LOW (ref 21–57)
TIBC: 474 ug/dL — ABNORMAL HIGH (ref 236–444)
UIBC: 449 ug/dL — ABNORMAL HIGH (ref 120–384)

## 2021-03-20 LAB — CBC WITH DIFFERENTIAL (CANCER CENTER ONLY)
Abs Immature Granulocytes: 0 10*3/uL (ref 0.00–0.07)
Band Neutrophils: 2 %
Basophils Absolute: 0.2 10*3/uL — ABNORMAL HIGH (ref 0.0–0.1)
Basophils Relative: 1 %
Eosinophils Absolute: 0.5 10*3/uL (ref 0.0–0.5)
Eosinophils Relative: 2 %
HCT: 45.2 % (ref 36.0–46.0)
Hemoglobin: 13.1 g/dL (ref 12.0–15.0)
Lymphocytes Relative: 5 %
Lymphs Abs: 1.1 10*3/uL (ref 0.7–4.0)
MCH: 21.1 pg — ABNORMAL LOW (ref 26.0–34.0)
MCHC: 29 g/dL — ABNORMAL LOW (ref 30.0–36.0)
MCV: 72.7 fL — ABNORMAL LOW (ref 80.0–100.0)
Monocytes Absolute: 0.2 10*3/uL (ref 0.1–1.0)
Monocytes Relative: 1 %
Neutro Abs: 20.8 10*3/uL — ABNORMAL HIGH (ref 1.7–7.7)
Neutrophils Relative %: 89 %
Platelet Count: 337 10*3/uL (ref 150–400)
RBC: 6.22 MIL/uL — ABNORMAL HIGH (ref 3.87–5.11)
RDW: 21.8 % — ABNORMAL HIGH (ref 11.5–15.5)
WBC Count: 22.9 10*3/uL — ABNORMAL HIGH (ref 4.0–10.5)
nRBC: 0.2 % (ref 0.0–0.2)

## 2021-03-20 LAB — FERRITIN: Ferritin: 60 ng/mL (ref 11–307)

## 2021-03-28 ENCOUNTER — Other Ambulatory Visit: Payer: Self-pay

## 2021-03-28 DIAGNOSIS — M1712 Unilateral primary osteoarthritis, left knee: Secondary | ICD-10-CM

## 2021-03-28 DIAGNOSIS — I10 Essential (primary) hypertension: Secondary | ICD-10-CM

## 2021-03-28 DIAGNOSIS — E782 Mixed hyperlipidemia: Secondary | ICD-10-CM

## 2021-03-28 DIAGNOSIS — I214 Non-ST elevation (NSTEMI) myocardial infarction: Secondary | ICD-10-CM

## 2021-03-28 DIAGNOSIS — Z8673 Personal history of transient ischemic attack (TIA), and cerebral infarction without residual deficits: Secondary | ICD-10-CM

## 2021-03-28 MED ORDER — FENOFIBRATE 145 MG PO TABS: 145.0000 mg | ORAL_TABLET | Freq: Every day | ORAL | 0 refills | Status: DC

## 2021-03-28 MED ORDER — VALSARTAN 160 MG PO TABS: 160.0000 mg | ORAL_TABLET | Freq: Two times a day (BID) | ORAL | 0 refills | Status: DC

## 2021-03-28 MED ORDER — ESCITALOPRAM OXALATE 20 MG PO TABS: 20.0000 mg | ORAL_TABLET | Freq: Every day | ORAL | 0 refills | Status: DC

## 2021-03-28 MED ORDER — EZETIMIBE 10 MG PO TABS: 10.0000 mg | ORAL_TABLET | Freq: Every day | ORAL | 0 refills | Status: DC

## 2021-05-20 ENCOUNTER — Telehealth: Payer: Self-pay | Admitting: Hematology

## 2021-05-20 ENCOUNTER — Inpatient Hospital Stay: Payer: 59

## 2021-05-20 NOTE — Telephone Encounter (Signed)
R/s appt per 5/31 sch msg. Pt aware.

## 2021-05-29 ENCOUNTER — Inpatient Hospital Stay: Payer: 59 | Attending: Hematology

## 2021-05-29 ENCOUNTER — Other Ambulatory Visit: Payer: Self-pay

## 2021-05-29 DIAGNOSIS — D45 Polycythemia vera: Secondary | ICD-10-CM | POA: Insufficient documentation

## 2021-05-29 DIAGNOSIS — D500 Iron deficiency anemia secondary to blood loss (chronic): Secondary | ICD-10-CM

## 2021-05-29 LAB — CBC WITH DIFFERENTIAL (CANCER CENTER ONLY)
Abs Immature Granulocytes: 0.92 10*3/uL — ABNORMAL HIGH (ref 0.00–0.07)
Basophils Absolute: 0.4 10*3/uL — ABNORMAL HIGH (ref 0.0–0.1)
Basophils Relative: 2 %
Eosinophils Absolute: 0.6 10*3/uL — ABNORMAL HIGH (ref 0.0–0.5)
Eosinophils Relative: 2 %
HCT: 46.9 % — ABNORMAL HIGH (ref 36.0–46.0)
Hemoglobin: 13.8 g/dL (ref 12.0–15.0)
Immature Granulocytes: 4 %
Lymphocytes Relative: 5 %
Lymphs Abs: 1.2 10*3/uL (ref 0.7–4.0)
MCH: 22.2 pg — ABNORMAL LOW (ref 26.0–34.0)
MCHC: 29.4 g/dL — ABNORMAL LOW (ref 30.0–36.0)
MCV: 75.5 fL — ABNORMAL LOW (ref 80.0–100.0)
Monocytes Absolute: 0.6 10*3/uL (ref 0.1–1.0)
Monocytes Relative: 3 %
Neutro Abs: 19.2 10*3/uL — ABNORMAL HIGH (ref 1.7–7.7)
Neutrophils Relative %: 84 %
Platelet Count: 471 10*3/uL — ABNORMAL HIGH (ref 150–400)
RBC: 6.21 MIL/uL — ABNORMAL HIGH (ref 3.87–5.11)
RDW: 22.8 % — ABNORMAL HIGH (ref 11.5–15.5)
WBC Count: 22.9 10*3/uL — ABNORMAL HIGH (ref 4.0–10.5)
nRBC: 0.1 % (ref 0.0–0.2)

## 2021-05-29 LAB — IRON AND TIBC
Iron: 22 ug/dL — ABNORMAL LOW (ref 41–142)
Saturation Ratios: 5 % — ABNORMAL LOW (ref 21–57)
TIBC: 428 ug/dL (ref 236–444)
UIBC: 406 ug/dL — ABNORMAL HIGH (ref 120–384)

## 2021-05-29 LAB — FERRITIN: Ferritin: 47 ng/mL (ref 11–307)

## 2021-06-02 ENCOUNTER — Other Ambulatory Visit: Payer: Self-pay | Admitting: Family Medicine

## 2021-06-02 DIAGNOSIS — K219 Gastro-esophageal reflux disease without esophagitis: Secondary | ICD-10-CM

## 2021-06-02 DIAGNOSIS — E782 Mixed hyperlipidemia: Secondary | ICD-10-CM

## 2021-06-02 DIAGNOSIS — M1712 Unilateral primary osteoarthritis, left knee: Secondary | ICD-10-CM

## 2021-06-02 DIAGNOSIS — I482 Chronic atrial fibrillation, unspecified: Secondary | ICD-10-CM

## 2021-06-02 NOTE — Telephone Encounter (Signed)
Je NTBS 30 days given in April. Pt was to have ckkup in May

## 2021-06-04 ENCOUNTER — Ambulatory Visit (INDEPENDENT_AMBULATORY_CARE_PROVIDER_SITE_OTHER): Payer: 59 | Admitting: Nurse Practitioner

## 2021-06-04 ENCOUNTER — Ambulatory Visit (INDEPENDENT_AMBULATORY_CARE_PROVIDER_SITE_OTHER): Payer: 59

## 2021-06-04 ENCOUNTER — Other Ambulatory Visit: Payer: Self-pay

## 2021-06-04 ENCOUNTER — Encounter: Payer: Self-pay | Admitting: Nurse Practitioner

## 2021-06-04 VITALS — BP 85/59 | HR 69 | Temp 98.4°F | Ht 64.0 in | Wt 134.2 lb

## 2021-06-04 DIAGNOSIS — I10 Essential (primary) hypertension: Secondary | ICD-10-CM

## 2021-06-04 DIAGNOSIS — E782 Mixed hyperlipidemia: Secondary | ICD-10-CM

## 2021-06-04 DIAGNOSIS — M1712 Unilateral primary osteoarthritis, left knee: Secondary | ICD-10-CM

## 2021-06-04 DIAGNOSIS — Z8673 Personal history of transient ischemic attack (TIA), and cerebral infarction without residual deficits: Secondary | ICD-10-CM | POA: Diagnosis not present

## 2021-06-04 DIAGNOSIS — F339 Major depressive disorder, recurrent, unspecified: Secondary | ICD-10-CM

## 2021-06-04 DIAGNOSIS — M5136 Other intervertebral disc degeneration, lumbar region: Secondary | ICD-10-CM

## 2021-06-04 DIAGNOSIS — I952 Hypotension due to drugs: Secondary | ICD-10-CM

## 2021-06-04 DIAGNOSIS — Z780 Asymptomatic menopausal state: Secondary | ICD-10-CM | POA: Diagnosis not present

## 2021-06-04 DIAGNOSIS — I214 Non-ST elevation (NSTEMI) myocardial infarction: Secondary | ICD-10-CM

## 2021-06-04 DIAGNOSIS — M25562 Pain in left knee: Secondary | ICD-10-CM

## 2021-06-04 MED ORDER — FENOFIBRATE 145 MG PO TABS: 145.0000 mg | ORAL_TABLET | Freq: Every day | ORAL | 0 refills | Status: DC

## 2021-06-04 MED ORDER — ESCITALOPRAM OXALATE 20 MG PO TABS: 20.0000 mg | ORAL_TABLET | Freq: Every day | ORAL | 0 refills | Status: DC

## 2021-06-04 MED ORDER — EZETIMIBE 10 MG PO TABS: 10.0000 mg | ORAL_TABLET | Freq: Every day | ORAL | 0 refills | Status: DC

## 2021-06-04 MED ORDER — VALSARTAN 160 MG PO TABS: 160.0000 mg | ORAL_TABLET | Freq: Every day | ORAL | 0 refills | Status: DC

## 2021-06-04 MED ORDER — TRAMADOL HCL 50 MG PO TABS: 50.0000 mg | ORAL_TABLET | Freq: Two times a day (BID) | ORAL | 0 refills | Status: DC

## 2021-06-04 NOTE — Patient Instructions (Signed)
Chronic Pain, Adult Chronic pain is a type of pain that lasts or keeps coming back for at least 3-6 months. You may have headaches, pain in the abdomen, or pain in other areas of the body. Chronic pain may be related to an illness, such as fibromyalgia or complex regional pain syndrome. Chronic pain may also be related to an injuryor a health condition. Sometimes, the cause of chronic pain is not known. Chronic pain can make it hard for you to do daily activities. If not treated, chronic pain can lead to anxiety and depression. Treatment depends on the cause and severity of your pain. You may need to work with a pain specialist to come up with a treatment plan. The plan may include medicine, counseling, and physical therapy. Many people benefit from a combination of two or more typesof treatment to control their pain. Follow these instructions at home: Medicines Take over-the-counter and prescription medicines only as told by your health care provider. Ask your health care provider if the medicine prescribed to you: Requires you to avoid driving or using machinery. Can cause constipation. You may need to take these actions to prevent or treat constipation: Drink enough fluid to keep your urine pale yellow. Take over-the-counter or prescription medicines. Eat foods that are high in fiber, such as beans, whole grains, and fresh fruits and vegetables. Limit foods that are high in fat and processed sugars, such as fried or sweet foods. Treatment plan Follow your treatment plan as told by your health care provider. This may include: Gentle, regular exercise. Eating a healthy diet that includes foods such as vegetables, fruits, fish, and lean meats. Cognitive or behavioral therapy that changes the way you think or act in response to the pain. This may help improve how you feel. Working with a physical therapist. Meditation, yoga, acupuncture, or massage therapy. Aroma, color, light, or sound  therapy. Local electrical stimulation. The electrical pulses help to relieve pain by temporarily stopping the nerve impulses that cause you to feel pain. Injections. These deliver numbing or pain-relieving medicines into the spine or the area of pain.  Lifestyle  Ask your health care provider whether you should keep a pain diary. Your health care provider will tell you what information to write in the diary. This may include when you have pain, what the pain feels like, and how medicines and other behaviors or treatments help to reduce the pain. Consider talking with a mental health care provider about how to manage chronic pain. Consider joining a chronic pain support group. Try to control or lower your stress levels. Talk with your health care provider about ways to do this.  General instructions Learn as much as you can about how to manage your chronic pain. Ask your health care provider if an intensive pain rehabilitation program or a chronic pain specialist would be helpful. Check your pain level as told by your health care provider. Ask your health care provider if you should use a pain scale. It is up to you to get the results of any tests that were done. Ask your health care provider, or the department that is doing the tests, when your results will be ready. Keep all follow-up visits as told by your health care provider. This is important. Contact a health care provider if: Your pain gets worse, or you have new pain. You have trouble sleeping. You have trouble doing your normal activities. Your pain is not controlled with treatment. You have side effects from pain   medicine. You feel weak. You notice any other changes that show that your condition is getting worse. Get help right away if: You lose feeling or have numbness in your body. You lose control of bowel or bladder function. Your pain suddenly gets much worse. You develop shaking or chills. You develop confusion. You  develop chest pain. You have trouble breathing or shortness of breath. You pass out. You have thoughts about hurting yourself or others. If you ever feel like you may hurt yourself or others, or have thoughts about taking your own life, get help right away. Go to your nearest emergency department or: Call your local emergency services (911 in the [place] Call a suicide crisis helpline, such as the [place] at 604 761 8575. This is open 24 hours a day in the [place] Text the Crisis Text Line at 315-671-2503 (in the [place].). Summary Chronic pain is a type of pain that lasts or keeps coming back for at least 3-6 months. Chronic pain may be related to an illness, injury, or other health condition. Sometimes, the cause of chronic pain is not known. Treatment depends on the cause and severity of your pain. Many people benefit from a combination of two or more types of treatment to control their pain. Follow your treatment plan as told by your health care provider. This information is not intended to replace advice given to you by your health care provider. Make sure you discuss any questions you have with your healthcare provider. Document Revised: 08/24/2019 Document Reviewed: 08/24/2019 Elsevier Patient Education  2022 [place]. Hypertension, Adult Hypertension is another name for high blood pressure. High blood pressure forces your heart to work harder to pump blood. This can cause problems overtime. There are two numbers in a blood pressure reading. There is a top number (systolic) over a bottom number (diastolic). It is best to have a blood pressure that is below 120/80. Healthy choicescan help lower your blood pressure, or you may need medicine to help lower it. What are the causes? The cause of this condition is not known. Some conditions may be related tohigh blood pressure. What increases the risk? Smoking. Having type 2 diabetes mellitus, high cholesterol, or  both. Not getting enough exercise or physical activity. Being overweight. Having too much fat, sugar, calories, or salt (sodium) in your diet. Drinking too much alcohol. Having long-term (chronic) kidney disease. Having a family history of high blood pressure. Age. Risk increases with age. Race. You may be at higher risk if you are African American. Gender. Men are at higher risk than women before age 71. After age 67, women are at higher risk than men. Having obstructive sleep apnea. Stress. What are the signs or symptoms? High blood pressure may not cause symptoms. Very high blood pressure (hypertensive crisis) may cause: Headache. Feelings of worry or nervousness (anxiety). Shortness of breath. Nosebleed. A feeling of being sick to your stomach (nausea). Throwing up (vomiting). Changes in how you see. Very bad chest pain. Seizures. How is this treated? This condition is treated by making healthy lifestyle changes, such as: Eating healthy foods. Exercising more. Drinking less alcohol. Your health care provider may prescribe medicine if lifestyle changes are not enough to get your blood pressure under control, and if: Your top number is above 130. Your bottom number is above 80. Your personal target blood pressure may vary. Follow these instructions at home: Eating and drinking  If told, follow the DASH eating plan. To follow this plan: Fill  one half of your plate at each meal with fruits and vegetables. Fill one fourth of your plate at each meal with whole grains. Whole grains include whole-wheat pasta, brown rice, and whole-grain bread. Eat or drink low-fat dairy products, such as skim milk or low-fat yogurt. Fill one fourth of your plate at each meal with low-fat (lean) proteins. Low-fat proteins include fish, chicken without skin, eggs, beans, and tofu. Avoid fatty meat, cured and processed meat, or chicken with skin. Avoid pre-made or processed food. Eat less than  1,500 mg of salt each day. Do not drink alcohol if: Your doctor tells you not to drink. You are pregnant, may be pregnant, or are planning to become pregnant. If you drink alcohol: Limit how much you use to: 0-1 drink a day for women. 0-2 drinks a day for men. Be aware of how much alcohol is in your drink. In the [place] one drink equals one 12 oz bottle of beer (355 mL), one 5 oz glass of wine (148 mL), or one 1 oz glass of hard liquor (44 mL).  Lifestyle  Work with your doctor to stay at a healthy weight or to lose weight. Ask your doctor what the best weight is for you. Get at least 30 minutes of exercise most days of the week. This may include walking, swimming, or biking. Get at least 30 minutes of exercise that strengthens your muscles (resistance exercise) at least 3 days a week. This may include lifting weights or doing Pilates. Do not use any products that contain nicotine or tobacco, such as cigarettes, e-cigarettes, and chewing tobacco. If you need help quitting, ask your doctor. Check your blood pressure at home as told by your doctor. Keep all follow-up visits as told by your doctor. This is important.  Medicines Take over-the-counter and prescription medicines only as told by your doctor. Follow directions carefully. Do not skip doses of blood pressure medicine. The medicine does not work as well if you skip doses. Skipping doses also puts you at risk for problems. Ask your doctor about side effects or reactions to medicines that you should watch for. Contact a doctor if you: Think you are having a reaction to the medicine you are taking. Have headaches that keep coming back (recurring). Feel dizzy. Have swelling in your ankles. Have trouble with your vision. Get help right away if you: Get a very bad headache. Start to feel mixed up (confused). Feel weak or numb. Feel faint. Have very bad pain in your: Chest. Belly (abdomen). Throw up more than once. Have trouble  breathing. Summary Hypertension is another name for high blood pressure. High blood pressure forces your heart to work harder to pump blood. For most people, a normal blood pressure is less than 120/80. Making healthy choices can help lower blood pressure. If your blood pressure does not get lower with healthy choices, you may need to take medicine. This information is not intended to replace advice given to you by your health care provider. Make sure you discuss any questions you have with your healthcare provider. Document Revised: 08/17/2018 Document Reviewed: 08/17/2018 Elsevier Patient Education  [place].

## 2021-06-04 NOTE — Progress Notes (Signed)
Established Patient Office Visit  Subjective:  Patient ID: [name], female    DOB: 1951-11-26  Age: 70 y.o. MRN: [id number]  CC:  Chief Complaint  Patient presents with   Medical Management of Chronic Issues    HPI [name] presents for follow up of hypertension. Patient was diagnosed in 01/08/2016 the patient is tolerating the medication well without side effects. Compliance with treatment has been good; including taking medication as directed , maintains a healthy diet and regular exercise regimen , and following up as directed.   Mixed hyperlipidemia  Pt presents with hyperlipidemia. Patient was diagnosed in 04/27/2013. Compliance with treatment has been good; the patient is compliant with medications, maintains a low cholesterol diet , follows up as directed , and maintains an exercise regimen as tolerated. The patient denies experiencing any hypercholesterolemia related symptoms.    Depression, Follow-up  She  was last seen for this 4 months ago. Changes made at last visit include Lexapro 20 mg tablet by mouth daily.   She reports good compliance with treatment. She is not having side effects.   She reports good tolerance of treatment. Current symptoms include:  Moving slow She feels she is Improved since last visit.  Depression screen [facility] 2/9 06/04/2021 01/22/2021 06/28/2020  Decreased Interest 0 1 0  Down, Depressed, Hopeless 0 1 0  PHQ - 2 Score 0 2 0  Altered sleeping 0 0 0  Tired, decreased energy 0 3 3  Change in appetite 0 1 0  Feeling bad or failure about yourself  0 2 1  Trouble concentrating 0 1 0  Moving slowly or fidgety/restless 1 0 0  Suicidal thoughts 0 0 0  PHQ-9 Score 1 9 4   Difficult doing work/chores Somewhat difficult Somewhat difficult -  Some recent data might be hidden    -----------------------------------------------------------------------------------------   Past Medical History:  Diagnosis Date   Arthritis    "fingers, some in my  knees" (01/28/2016)   CAD (coronary artery disease), per cath 04/27/13, non obstructive disease, EF 65-70% 05/11/2013   Carotid disease, bilateral ([place]) 09/2013   Lt carotid 50-69% stentosis, less on rt   Dysrhythmia    Fatty liver    GERD (gastroesophageal reflux disease)    Glaucoma    H/O cardiovascular stress test 12/27/2009   negative for ischemia   Headache    "occasional since eye pressure regulated" (01/28/2016)   Hyperlipidemia    Hypertension    Leukocytosis 07/15/2015   Migraine    "stopped w/laser holes to relieve pressure in my eyes; had them 3-4 times/wk before taht" (01/28/2016)   Mitral regurgitation,2+ by cath 05/11/2013   NSTEMI (non-ST elevated myocardial infarction) ([place]) 04/27/2013   Pain    BOTH KNEES - PT HAS TORN MENISCUS LEFT KNEE   Paroxysmal atrial fibrillation (HCC)    hx of a fib on ASA  AND ELIQUIS    Polycythemia vera ([place]) 08/20/2015   JAK-2 positive 07/19/15   PVD (peripheral vascular disease) with claudication ([place]) 07/2006   previous L ext iliac stent and rt SFA stent, known occluded Lt SFA   S/P cardiac cath 04/27/13   NON OBSTRUCTIVE DISEASE, 2+ mr, ef 65-70%   Statin intolerance    Stroke ([place]) 2006   SWELLING ALL OVER AND RT SIDE OF FACE DRAWN AND SPEECH SLURRED AND NUMBNESS ON RIGHT SIDE-- ALL RESOLVED    Past Surgical History:  Procedure Laterality Date   AMPUTATION Right 05/21/2017   Procedure: AMPUTATION ABOVE  KNEE;  Surgeon: Rosetta Posner, MD;  Location: [facility] OR;  Service: Vascular;  Laterality: Right;   CARDIAC CATHETERIZATION  04/27/13   non occlusive disease with mild to mod. calcified lesions in ostial LM and proximal LAD and moderate prox. RC AND DISTAL AV GROOVE LCX, ef   CATARACT EXTRACTION W/PHACO Right 03/21/2015   Procedure: CATARACT EXTRACTION PHACO AND INTRAOCULAR LENS PLACEMENT RIGHT EYE;  Surgeon: Baruch Goldmann, MD;  Location: [facility] ORS;  Service: Ophthalmology;  Laterality: Right;  CDE:5.10   CATARACT EXTRACTION W/PHACO Left 05/16/2015    Procedure: CATARACT EXTRACTION PHACO AND INTRAOCULAR LENS PLACEMENT (IOC);  Surgeon: Baruch Goldmann, MD;  Location: [facility] ORS;  Service: Ophthalmology;  Laterality: Left;  CDE 5.57   CESAREAN SECTION  1977   COLONOSCOPY N/A 09/11/2016   Procedure: COLONOSCOPY;  Surgeon: Rogene Houston, MD;  Location: [facility] END[facility] SUITE;  Service: Endoscopy;  Laterality: N/A;  730-moved to 1:00 Ann notified pt   EMBOLECTOMY Right 02/01/2016   Procedure: Thrombectomy of Right Femoral-Popliteal Bypass Graft; Endarterectomy of Right Below Knee Popliteal Artery and Tibial Peroneal Trunk with Bovine Pericardium Patch Angioplasty.;  Surgeon: Angelia Mould, MD;  Location: [place];  Service: Vascular;  Laterality: Right;   FEMORAL-POPLITEAL BYPASS GRAFT Right 01/31/2016   Procedure: BYPASS GRAFT RIGHT COMMON  FEMORALTO BELOW KNEE POPLITEAL ARTERY BYPASS GRAFT USING 6MM PROPATEN GORTEX GRAFT;  Surgeon: Mal Misty, MD;  Location: [place];  Service: Vascular;  Laterality: Right;   FEMORAL-POPLITEAL BYPASS GRAFT Right 01/09/2017   Procedure: THROMBECTOMY OF RIGHT FEMORAL-POPLITEAL  ARTERY BYPASS GRAFT; THROMBECTOMY RIGHT  TIBIAL VESSELS;  Surgeon: [name], MD;  Location: [place];  Service: Vascular;  Laterality: Right;   FEMORAL-POPLITEAL BYPASS GRAFT Right 05/17/2017   Procedure: RIGHT FEMORAL-TIBIAL PERONEAL TRUNK ARTERY BYPASS GRAFT USING 26mmX80cm PROPATEN GRAFT WITH REMOVABLE RING;  Surgeon: Rosetta Posner, MD;  Location: [place];  Service: Vascular;  Laterality: Right;   FRACTURE SURGERY     ILIAC ARTERY STENT  07/2006   external iliac and Rt SFA stent 07/2006 AND THE LEFT WAS IN 2006   KNEE ARTHROSCOPY WITH MEDIAL MENISECTOMY Left 03/27/2014   Procedure: left knee arthorscopy with medial chondraplasty of the medial femoral and patella, medial microfracture technique of medial femoral condyl;  Surgeon: Tobi Bastos, MD;  Location: [facility] ORS;  Service: Orthopedics;  Laterality: Left;   LEFT HEART CATHETERIZATION WITH CORONARY  ANGIOGRAM Right 04/27/2013   Procedure: LEFT HEART CATHETERIZATION WITH CORONARY ANGIOGRAM;  Surgeon: Leonie Man, MD;  Location: [facility] CATH LAB;  Service: Cardiovascular;  Laterality: Right;   LOWER EXTREMITY ANGIOGRAM Right 01/31/2016   Procedure: INTRAOP RIGHT LOWER EXTREMITY ANGIOGRAM;  Surgeon: Mal Misty, MD;  Location: [place];  Service: Vascular;  Laterality: Right;   ORIF WRIST FRACTURE Right 1983   OVARIAN CYST REMOVAL Right    PATCH ANGIOPLASTY Right 01/31/2016   Procedure: RIGHT COMMON FEMORAL AND PROFUNDA FEMORIS ENDARECTOMY WITH PATCH ANGIOPLASTY;  Surgeon: Mal Misty, MD;  Location: [place];  Service: Vascular;  Laterality: Right;   PATCH ANGIOPLASTY Right 01/09/2017   Procedure: PATCH ANGIOPLASTY RIGHT POPLITEAL ARTERY BYPASS GRAFT;  Surgeon: [name], MD;  Location: [place];  Service: Vascular;  Laterality: Right;   PERIPHERAL VASCULAR CATHETERIZATION N/A 06/27/2015   Procedure: Lower Extremity Angiography;  Surgeon: Lorretta Harp, MD;  Location: [place] CV LAB;  Service: Cardiovascular;  Laterality: N/A;   PERIPHERAL VASCULAR CATHETERIZATION Bilateral 01/29/2016   Procedure: Lower Extremity Angiography;  Surgeon:  Wellington Hampshire, MD;  Location: [place] CV LAB;  Service: Cardiovascular;  Laterality: Bilateral;   PERIPHERAL VASCULAR CATHETERIZATION N/A 01/29/2016   Procedure: Abdominal Aortogram;  Surgeon: Wellington Hampshire, MD;  Location: [place] CV LAB;  Service: Cardiovascular;  Laterality: N/A;   REFRACTIVE SURGERY Bilateral    "6 in one eye; 7 in the other; to relieve pressure; not glaucoma" (01/28/2016)   SFA Right 06/27/2015   overlapping Bahn covered stents   [place] Left 05/17/2017   Procedure: LEFT LEG GREATER SAPHENOUS VEIN HARVEST;  Surgeon: Rosetta Posner, MD;  Location: [facility] OR;  Service: Vascular;  Laterality: Left;   VEIN REPAIR Right 01/31/2016   Procedure: RIGHT GREATER SAPHENOUS VEIN EXAMNED BUT NOT REMOVED;  Surgeon:  Mal Misty, MD;  Location: [facility] OR;  Service: Vascular;  Laterality: Right;    Family History  Problem Relation Age of Onset   CAD Mother    Lung cancer Mother    Heart attack Mother    CAD Father    Heart disease Father    Heart attack Father 22       died in hes 81's with heart disease   Healthy Sister    Liver cancer Brother    Healthy Sister    CAD Brother        has had CABG   CAD Brother     Social History   Socioeconomic History   Marital status: Widowed    Spouse name: Not on file   Number of children: 2   Years of education: 56   Highest education level: Not on file  Occupational History   Occupation: CNA  Tobacco Use   Smoking status: Former    Packs/day: 1.00    Years: 45.00    Pack years: 45.00    Types: Cigarettes    Quit date: 11/11/2012    Years since quitting: 8.5   Smokeless tobacco: Never   Tobacco comments:    quit 4 years ago  Vaping Use   Vaping Use: Never used  Substance and Sexual Activity   Alcohol use: No    Alcohol/week: 0.0 standard drinks   Drug use: No   Sexual activity: Not Currently    Birth control/protection: None  Other Topics Concern   Not on file  Social History Narrative   Not on file   Social Determinants of Health   Financial Resource Strain: Not on file  Food Insecurity: Not on file  Transportation Needs: Not on file  Physical Activity: Not on file  Stress: Not on file  Social Connections: Not on file  Intimate Partner Violence: Not on file    Outpatient Medications Prior to Visit  Medication Sig Dispense Refill   acetaminophen (TYLENOL) 500 MG tablet Take 2 tablets (1,000 mg total) by mouth every 6 (six) hours. 30 tablet 0   alendronate (FOSAMAX) 70 MG tablet TAKE 1 TABLET ONCE A WEEK 4 tablet 2   ARTIFICIAL TEAR OP Place 1 drop into both eyes as needed (for dry eyes).     aspirin EC 325 MG tablet Take 325 mg by mouth daily.     chlorthalidone (HYGROTON) 25 MG tablet Take 1 tablet (25 mg total) by mouth  daily. 30 tablet 5   diltiazem (CARDIZEM CD) 240 MG 24 hr capsule Take 1 capsule (240 mg total) by mouth every morning. 90 capsule 3   flecainide (TAMBOCOR) 50 MG tablet Take 1 tablet (50 mg  total) by mouth 2 (two) times daily. PLEASE CONTACT OFFICE FOR ADDITIONAL REFILLS 60 tablet 11   mupirocin ointment (BACTROBAN) 2 % APPLY TO AFFECTED AREA 3)TIMES A DAY FOR 1 WEEK     omeprazole (PRILOSEC) 20 MG capsule TAKE 1 CAPSULE 2 TIMES A DAY BEFORE A MEAL 60 capsule 5   predniSONE (DELTASONE) 5 MG tablet Take by mouth.     triamcinolone cream (KENALOG) 0.1 % Apply 1 g topically 1 day or 1 dose.     apixaban (ELIQUIS) 5 MG TABS tablet Take 1 tablet (5 mg total) by mouth 2 (two) times daily. 60 tablet 2   escitalopram (LEXAPRO) 20 MG tablet Take 1 tablet (20 mg total) by mouth daily. (NEEDS TO BE SEEN BEFORE NEXT REFILL) 30 tablet 0   ezetimibe (ZETIA) 10 MG tablet Take 1 tablet (10 mg total) by mouth daily. (NEEDS TO BE SEEN BEFORE NEXT REFILL) 30 tablet 0   fenofibrate (TRICOR) 145 MG tablet Take 1 tablet (145 mg total) by mouth daily. (NEEDS TO BE SEEN BEFORE NEXT REFILL) 30 tablet 0   traMADol (ULTRAM) 50 MG tablet Take 1 tablet (50 mg total) by mouth 2 (two) times daily. 60 tablet 0   valsartan (DIOVAN) 160 MG tablet Take 1 tablet (160 mg total) by mouth 2 (two) times daily. (NEEDS TO BE SEEN BEFORE NEXT REFILL) 60 tablet 0   hydroxyurea (HYDREA) 500 MG capsule Take one capsule daily except Mondays, Wednesdays and Fridays. May take with food to minimize GI side effects. 20 capsule 3   No facility-administered medications prior to visit.    Allergies  Allergen Reactions   Atenolol Rash and Itching   Demerol  [Meperidine Hcl] Itching   Demerol [Meperidine] Other (See Comments) and Itching    Unknown, can't remember  Unknown, can't remember    Gabapentin Anxiety and Other (See Comments)    SI and HI   Inderal [Propranolol] Other (See Comments) and Itching    Doesn't remember    Prednisone  Other (See Comments)    Muscle spasms   Statins Itching and Other (See Comments)    Simvastatin-caused severe itching Pravastatin-caused lesser tiching One type of statin? Caused stroke in 2006, and other symptoms as result   Warfarin And Related Other (See Comments)    Caused nose bleeds   Crestor [Rosuvastatin] Itching and Rash   Docosahexaenoic Acid (Dha)     Other reaction(s): Other (See Comments) nosebleeds   Lactose Intolerance (Gi) Other (See Comments)    Bloating, gas   Lipitor [Atorvastatin] Rash   Lovaza [Omega-3-Acid Ethyl Esters] Other (See Comments)    nosebleeds    Penicillins Hives, Rash and Other (See Comments)    Has patient had a PCN reaction causing immediate rash, facial/tongue/throat swelling, SOB or lightheadedness with hypotension: Yes Has patient had a PCN reaction causing severe rash involving mucus membranes or skin necrosis: No Has patient had a PCN reaction that required hospitalization: No Has patient had a PCN reaction occurring within the last 10 years: No If all of the above answers are "NO", then may proceed with Cephalosporin use.  Questionable high fever    Pravastatin Itching and Rash   Simvastatin Itching, Rash and Other (See Comments)   Trilipix [Choline Fenofibrate] Other (See Comments)    Doesn't remember    Warfarin Other (See Comments)    nosebleeds   Hydralazine Swelling   Effexor [Venlafaxine Hcl] Other (See Comments)    Doesn't remember    Nexium [Esomeprazole  Magnesium] Rash   Percocet [Oxycodone-Acetaminophen] Other (See Comments)    Doesn't remember    Plavix [Clopidogrel Bisulfate] Rash    ROS Review of Systems  Constitutional: Negative.   HENT: Negative.    Respiratory: Negative.    Cardiovascular:  Negative for palpitations and leg swelling.  Gastrointestinal: Negative.   Genitourinary: Negative.   Skin:  Negative for rash.  Psychiatric/Behavioral:  Negative for self-injury and suicidal ideas. The patient is  nervous/anxious.   All other systems reviewed and are negative.    Objective:    Physical Exam Vitals and nursing note reviewed. Exam conducted with a chaperone present (Granddaughter).  Constitutional:      Appearance: Normal appearance.  HENT:     Head: Normocephalic.     Nose: Nose normal.  Eyes:     Conjunctiva/sclera: Conjunctivae normal.  Cardiovascular:     Rate and Rhythm: Regular rhythm.     Pulses: Normal pulses.     Heart sounds: Normal heart sounds.     Comments: Hypotension  Pulmonary:     Effort: Pulmonary effort is normal.     Breath sounds: Normal breath sounds.  Abdominal:     General: Bowel sounds are normal.  Skin:    General: Skin is dry.     Findings: Bruising present.  Neurological:     Mental Status: She is alert and oriented to person, place, and time.    BP (!) 85/59   Pulse 69   Temp 98.4 F (36.9 C) (Temporal)   Ht 5\' 4"  (1.626 m)   Wt 134 lb 3.2 oz (60.9 kg)   SpO2 (!) 77%   BMI 23.04 kg/m  Wt Readings from Last 3 Encounters:  06/04/21 134 lb 3.2 oz (60.9 kg)  03/20/21 134 lb 14.4 oz (61.2 kg)  02/20/21 134 lb 14.4 oz (61.2 kg)     Health Maintenance Due  Topic Date Due   Hepatitis C Screening  Never done    There are no preventive care reminders to display for this patient.  Lab Results  Component Value Date   TSH 1.550 06/28/2020   Lab Results  Component Value Date   WBC 22.9 (H) 05/29/2021   HGB 13.8 05/29/2021   HCT 46.9 (H) 05/29/2021   MCV 75.5 (L) 05/29/2021   PLT 471 (H) 05/29/2021   Lab Results  Component Value Date   NA 136 11/24/2019   K 4.8 11/24/2019   CO2 18 (L) 11/24/2019   GLUCOSE 89 11/24/2019   BUN 21 11/24/2019   CREATININE 0.93 11/24/2019   BILITOT 0.2 08/15/2019   ALKPHOS 62 08/15/2019   AST 18 08/15/2019   ALT 20 08/15/2019   PROT 6.8 08/15/2019   ALBUMIN 4.1 08/15/2019   CALCIUM 9.0 11/24/2019   ANIONGAP 10 09/12/2018   Lab Results  Component Value Date   CHOL 171 06/04/2021    Lab Results  Component Value Date   HDL 33 (L) 06/04/2021   Lab Results  Component Value Date   LDLCALC 88 06/04/2021   Lab Results  Component Value Date   TRIG 298 (H) 06/04/2021   Lab Results  Component Value Date   CHOLHDL 5.2 (H) 06/04/2021   Lab Results  Component Value Date   HGBA1C 5.7 (H) 01/28/2016      Assessment & Plan:   Problem List Items Addressed This Visit       Cardiovascular and Mediastinum   NSTEMI (non-ST elevated myocardial infarction) ([place])   Relevant Medications   valsartan (  DIOVAN) 160 MG tablet   ezetimibe (ZETIA) 10 MG tablet   fenofibrate (TRICOR) 145 MG tablet   Essential hypertension - Primary    Symptoms well controlled continue current blood pressure medication continue low-sodium heart healthy diet, exercise as tolerated, education printed handouts given. Rx refill sent to pharmacy Follow-up in 3 months Labs previously completed.       Relevant Medications   valsartan (DIOVAN) 160 MG tablet   ezetimibe (ZETIA) 10 MG tablet   fenofibrate (TRICOR) 145 MG tablet   Benign essential HTN   Relevant Medications   valsartan (DIOVAN) 160 MG tablet   ezetimibe (ZETIA) 10 MG tablet   fenofibrate (TRICOR) 145 MG tablet   Hypotension due to drugs    New onset symptoms of hypotension due to drugs.  Blood pressure in clinic today 85/59.  Reviewed patient's medication with patient.  Patient was taking 160 mg of valsartan twice daily.  Changed medication to 160 mg tablet by mouth daily.  Advised patient to keep a blood pressure log and follow-up in 2 weeks.  With uncontrolled or worsening symptoms.         Relevant Medications   valsartan (DIOVAN) 160 MG tablet   ezetimibe (ZETIA) 10 MG tablet   fenofibrate (TRICOR) 145 MG tablet     Musculoskeletal and Integument   Osteoarthritis of left knee   Relevant Medications   traMADol (ULTRAM) 50 MG tablet   fenofibrate (TRICOR) 145 MG tablet   DDD (degenerative disc disease), lumbar    Relevant Medications   traMADol (ULTRAM) 50 MG tablet   Other Relevant Orders   DG [facility] DEXA     Other   History of CVA (cerebrovascular accident) (Chronic)   Relevant Medications   ezetimibe (ZETIA) 10 MG tablet   Hyperlipidemia    No new signs or symptoms of hyperlipidemia.  Completed labs-lipid panel. Education provided to patient with printed handouts given on low-cholesterol diet, hydration and exercise. Rx refill sent to pharmacy  Follow-up in 3 months       Relevant Medications   valsartan (DIOVAN) 160 MG tablet   ezetimibe (ZETIA) 10 MG tablet   fenofibrate (TRICOR) 145 MG tablet   Other Relevant Orders   Lipid Panel (Completed)   Pain in left knee    Pain well controlled on current medication. Rx refill sent to pharmacy.       Depression, recurrent ([place])    Depression symptoms improved, continue on current medication dose.  Completed PHQ-9, follow-up as scheduled  Rx refill sent to pharmacy.       Relevant Medications   escitalopram (LEXAPRO) 20 MG tablet    Meds ordered this encounter  Medications   valsartan (DIOVAN) 160 MG tablet    Sig: Take 1 tablet (160 mg total) by mouth daily. (NEEDS TO BE SEEN BEFORE NEXT REFILL)    Dispense:  60 tablet    Refill:  0   traMADol (ULTRAM) 50 MG tablet    Sig: Take 1 tablet (50 mg total) by mouth 2 (two) times daily.    Dispense:  60 tablet    Refill:  0   ezetimibe (ZETIA) 10 MG tablet    Sig: Take 1 tablet (10 mg total) by mouth daily. (NEEDS TO BE SEEN BEFORE NEXT REFILL)    Dispense:  30 tablet    Refill:  0   fenofibrate (TRICOR) 145 MG tablet    Sig: Take 1 tablet (145 mg total) by mouth daily. (NEEDS TO BE SEEN BEFORE  NEXT REFILL)    Dispense:  30 tablet    Refill:  0   escitalopram (LEXAPRO) 20 MG tablet    Sig: Take 1 tablet (20 mg total) by mouth daily. (NEEDS TO BE SEEN BEFORE NEXT REFILL)    Dispense:  30 tablet    Refill:  0    Follow-up: Return in about 3 months (around 09/04/2021).     Ivy Lynn, NP

## 2021-06-05 ENCOUNTER — Other Ambulatory Visit: Payer: Self-pay | Admitting: Hematology

## 2021-06-05 ENCOUNTER — Other Ambulatory Visit: Payer: Self-pay | Admitting: Family Medicine

## 2021-06-05 DIAGNOSIS — I482 Chronic atrial fibrillation, unspecified: Secondary | ICD-10-CM

## 2021-06-05 DIAGNOSIS — I952 Hypotension due to drugs: Secondary | ICD-10-CM | POA: Insufficient documentation

## 2021-06-05 LAB — LIPID PANEL
Chol/HDL Ratio: 5.2 ratio — ABNORMAL HIGH (ref 0.0–4.4)
Cholesterol, Total: 171 mg/dL (ref 100–199)
HDL: 33 mg/dL — ABNORMAL LOW (ref >39)
LDL Chol Calc (NIH): 88 mg/dL (ref 0–99)
Triglycerides: 298 mg/dL — ABNORMAL HIGH (ref 0–149)
VLDL Cholesterol Cal: 50 mg/dL — ABNORMAL HIGH (ref 5–40)

## 2021-06-05 NOTE — Assessment & Plan Note (Signed)
Pain well controlled on current medication. Rx refill sent to pharmacy.

## 2021-06-05 NOTE — Assessment & Plan Note (Signed)
No new signs or symptoms of hyperlipidemia.  Completed labs-lipid panel. Education provided to patient with printed handouts given on low-cholesterol diet, hydration and exercise. Rx refill sent to pharmacy  Follow-up in 3 months

## 2021-06-05 NOTE — Assessment & Plan Note (Signed)
>>  ASSESSMENT AND PLAN FOR MIXED HYPERLIPIDEMIA WRITTEN ON 06/05/2021  4:32 PM BY IJAOLA, ONYEJE M, NP  No new signs or symptoms of hyperlipidemia.  Completed labs-lipid panel. Education provided to patient with printed handouts given on low-cholesterol diet, hydration and exercise. Rx refill sent to pharmacy  Follow-up in 3 months

## 2021-06-05 NOTE — Assessment & Plan Note (Signed)
Depression symptoms improved, continue on current medication dose.  Completed PHQ-9, follow-up as scheduled  Rx refill sent to pharmacy.

## 2021-06-05 NOTE — Assessment & Plan Note (Signed)
Symptoms well controlled continue current blood pressure medication continue low-sodium heart healthy diet, exercise as tolerated, education printed handouts given. Rx refill sent to pharmacy Follow-up in 3 months Labs previously completed.

## 2021-06-05 NOTE — Assessment & Plan Note (Signed)
New onset symptoms of hypotension due to drugs.  Blood pressure in clinic today 85/59.  Reviewed patient's medication with patient.  Patient was taking 160 mg of valsartan twice daily.  Changed medication to 160 mg tablet by mouth daily.  Advised patient to keep a blood pressure log and follow-up in 2 weeks.  With uncontrolled or worsening symptoms.

## 2021-06-06 ENCOUNTER — Other Ambulatory Visit: Payer: Self-pay | Admitting: Hematology

## 2021-06-06 MED ORDER — HYDROXYUREA 500 MG PO CAPS: ORAL_CAPSULE | ORAL | 3 refills | Status: DC

## 2021-06-09 ENCOUNTER — Other Ambulatory Visit: Payer: Self-pay | Admitting: Nurse Practitioner

## 2021-06-09 MED ORDER — CALCIUM CARBONATE-VITAMIN D 500-200 MG-UNIT PO TABS
1.0000 | ORAL_TABLET | Freq: Every day | ORAL | 1 refills | Status: DC
Start: 2021-06-09 — End: 2022-07-13

## 2021-06-09 NOTE — Progress Notes (Signed)
LMTCB 06/09/21 gr

## 2021-06-09 NOTE — Progress Notes (Signed)
LMTCB

## 2021-06-10 ENCOUNTER — Ambulatory Visit (INDEPENDENT_AMBULATORY_CARE_PROVIDER_SITE_OTHER): Payer: 59 | Admitting: Podiatry

## 2021-06-10 ENCOUNTER — Other Ambulatory Visit: Payer: Self-pay | Admitting: Family Medicine

## 2021-06-10 ENCOUNTER — Encounter: Payer: Self-pay | Admitting: Podiatry

## 2021-06-10 ENCOUNTER — Other Ambulatory Visit: Payer: Self-pay

## 2021-06-10 DIAGNOSIS — B351 Tinea unguium: Secondary | ICD-10-CM | POA: Diagnosis not present

## 2021-06-10 DIAGNOSIS — Z89611 Acquired absence of right leg above knee: Secondary | ICD-10-CM

## 2021-06-10 DIAGNOSIS — I739 Peripheral vascular disease, unspecified: Secondary | ICD-10-CM

## 2021-06-10 DIAGNOSIS — I10 Essential (primary) hypertension: Secondary | ICD-10-CM

## 2021-06-14 NOTE — Progress Notes (Signed)
Subjective:  Patient ID: [name], female    DOB: 1951/12/15,  MRN: [id number]  70 y.o. female presents at risk foot care. 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  PCP is Ivy Lynn, NP , and last visit was 06/04/2021.  Allergies  Allergen Reactions   Atenolol Rash and Itching   Demerol  [Meperidine Hcl] Itching   Demerol [Meperidine] Other (See Comments) and Itching    Unknown, can't remember  Unknown, can't remember    Gabapentin Anxiety and Other (See Comments)    SI and HI   Inderal [Propranolol] Other (See Comments) and Itching    Doesn't remember    Prednisone Other (See Comments)    Muscle spasms   Statins Itching and Other (See Comments)    Simvastatin-caused severe itching Pravastatin-caused lesser tiching One type of statin? Caused stroke in 2006, and other symptoms as result   Warfarin And Related Other (See Comments)    Caused nose bleeds   Crestor [Rosuvastatin] Itching and Rash   Docosahexaenoic Acid (Dha)     Other reaction(s): Other (See Comments) nosebleeds   Lactose Intolerance (Gi) Other (See Comments)    Bloating, gas   Lipitor [Atorvastatin] Rash   Lovaza [Omega-3-Acid Ethyl Esters] Other (See Comments)    nosebleeds    Penicillins Hives, Rash and Other (See Comments)    Has patient had a PCN reaction causing immediate rash, facial/tongue/throat swelling, SOB or lightheadedness with hypotension: Yes Has patient had a PCN reaction causing severe rash involving mucus membranes or skin necrosis: No Has patient had a PCN reaction that required hospitalization: No Has patient had a PCN reaction occurring within the last 10 years: No If all of the above answers are "NO", then may proceed with Cephalosporin use.  Questionable high fever    Pravastatin Itching and Rash   Simvastatin Itching, Rash and Other (See Comments)   Trilipix [Choline Fenofibrate] Other  (See Comments)    Doesn't remember    Warfarin Other (See Comments)    nosebleeds   Hydralazine Swelling   Effexor [Venlafaxine Hcl] Other (See Comments)    Doesn't remember    Nexium [Esomeprazole Magnesium] Rash   Percocet [Oxycodone-Acetaminophen] Other (See Comments)    Doesn't remember    Plavix [Clopidogrel Bisulfate] Rash    Review of Systems: Negative except as noted in the HPI.   Objective:   Constitutional Pt is a pleasant 70 y.o. Caucasian female WD, WN in NAD. AAO x 3.   Vascular Capillary fill time to digits <3 seconds left lower extremity. Faintly palpable DP pulse(s) left lower extremity. Faintly palpable PT pulse(s) left lower extremity. Pedal hair absent. Lower extremity skin temperature gradient within normal limits. No pain with calf compression b/l. No edema noted left lower extremity.  Neurologic Protective sensation decreased with 10 gram monofilament left lower extremity.  Dermatologic No open wounds left lower extremity No interdigital macerations left lower extremity Toenails 1-5 left elongated, discolored, dystrophic, thickened, and crumbly with subungual debris and tenderness to dorsal palpation.  Orthopedic: Muscle strength 5/5 to all LE muscle groups of left lower extremity. Lower extremity amputation(s): above knee amputation right lower extremity.   Radiographs: None Assessment:   1. Dermatophytosis of nail   2. PAD (peripheral artery disease) ([place])   3. Amputee, above knee, right ([place])     Plan:  -Patient was evaluated and treated and all questions answered. -Examined patient. -Patient  to continue soft, supportive shoe gear daily. -Toenails 1-5 left debrided in length and girth without iatrogenic bleeding with sterile nail nipper and dremel.  -Patient to report any pedal injuries to medical professional immediately. -Patient/POA to call should there be question/concern in the interim.  Return in about 3 months (around 09/10/2021).  Marzetta Board, DPM

## 2021-07-07 ENCOUNTER — Other Ambulatory Visit: Payer: Self-pay | Admitting: Family Medicine

## 2021-07-07 DIAGNOSIS — M5136 Other intervertebral disc degeneration, lumbar region: Secondary | ICD-10-CM

## 2021-07-08 ENCOUNTER — Other Ambulatory Visit: Payer: Self-pay | Admitting: Nurse Practitioner

## 2021-07-08 DIAGNOSIS — M5136 Other intervertebral disc degeneration, lumbar region: Secondary | ICD-10-CM

## 2021-07-08 MED ORDER — TRAMADOL HCL 50 MG PO TABS: 50.0000 mg | ORAL_TABLET | Freq: Three times a day (TID) | ORAL | 2 refills | Status: DC | PRN

## 2021-07-14 ENCOUNTER — Other Ambulatory Visit: Payer: Self-pay | Admitting: Family Medicine

## 2021-07-17 ENCOUNTER — Inpatient Hospital Stay: Payer: 59 | Attending: Hematology

## 2021-07-17 ENCOUNTER — Inpatient Hospital Stay (HOSPITAL_BASED_OUTPATIENT_CLINIC_OR_DEPARTMENT_OTHER): Payer: 59 | Admitting: Hematology

## 2021-07-17 ENCOUNTER — Encounter: Payer: Self-pay | Admitting: Hematology

## 2021-07-17 ENCOUNTER — Other Ambulatory Visit: Payer: Self-pay

## 2021-07-17 VITALS — BP 99/69 | HR 68 | Temp 98.1°F | Resp 18 | Ht 64.0 in | Wt 133.1 lb

## 2021-07-17 DIAGNOSIS — Z79899 Other long term (current) drug therapy: Secondary | ICD-10-CM | POA: Insufficient documentation

## 2021-07-17 DIAGNOSIS — Z8673 Personal history of transient ischemic attack (TIA), and cerebral infarction without residual deficits: Secondary | ICD-10-CM | POA: Diagnosis not present

## 2021-07-17 DIAGNOSIS — Z7982 Long term (current) use of aspirin: Secondary | ICD-10-CM | POA: Insufficient documentation

## 2021-07-17 DIAGNOSIS — I252 Old myocardial infarction: Secondary | ICD-10-CM | POA: Diagnosis not present

## 2021-07-17 DIAGNOSIS — D509 Iron deficiency anemia, unspecified: Secondary | ICD-10-CM | POA: Insufficient documentation

## 2021-07-17 DIAGNOSIS — D45 Polycythemia vera: Secondary | ICD-10-CM

## 2021-07-17 DIAGNOSIS — Z86718 Personal history of other venous thrombosis and embolism: Secondary | ICD-10-CM | POA: Insufficient documentation

## 2021-07-17 DIAGNOSIS — I480 Paroxysmal atrial fibrillation: Secondary | ICD-10-CM | POA: Diagnosis not present

## 2021-07-17 DIAGNOSIS — E785 Hyperlipidemia, unspecified: Secondary | ICD-10-CM | POA: Diagnosis not present

## 2021-07-17 DIAGNOSIS — Z7952 Long term (current) use of systemic steroids: Secondary | ICD-10-CM | POA: Diagnosis not present

## 2021-07-17 DIAGNOSIS — Z7901 Long term (current) use of anticoagulants: Secondary | ICD-10-CM | POA: Insufficient documentation

## 2021-07-17 DIAGNOSIS — I10 Essential (primary) hypertension: Secondary | ICD-10-CM | POA: Insufficient documentation

## 2021-07-17 LAB — CBC WITH DIFFERENTIAL (CANCER CENTER ONLY)
Abs Immature Granulocytes: 0.63 10*3/uL — ABNORMAL HIGH (ref 0.00–0.07)
Basophils Absolute: 0.4 10*3/uL — ABNORMAL HIGH (ref 0.0–0.1)
Basophils Relative: 1 %
Eosinophils Absolute: 0.6 10*3/uL — ABNORMAL HIGH (ref 0.0–0.5)
Eosinophils Relative: 2 %
HCT: 49.1 % — ABNORMAL HIGH (ref 36.0–46.0)
Hemoglobin: 14.3 g/dL (ref 12.0–15.0)
Immature Granulocytes: 3 %
Lymphocytes Relative: 6 %
Lymphs Abs: 1.4 10*3/uL (ref 0.7–4.0)
MCH: 21.7 pg — ABNORMAL LOW (ref 26.0–34.0)
MCHC: 29.1 g/dL — ABNORMAL LOW (ref 30.0–36.0)
MCV: 74.6 fL — ABNORMAL LOW (ref 80.0–100.0)
Monocytes Absolute: 0.5 10*3/uL (ref 0.1–1.0)
Monocytes Relative: 2 %
Neutro Abs: 21.2 10*3/uL — ABNORMAL HIGH (ref 1.7–7.7)
Neutrophils Relative %: 86 %
Platelet Count: 388 10*3/uL (ref 150–400)
RBC: 6.58 MIL/uL — ABNORMAL HIGH (ref 3.87–5.11)
RDW: 21.8 % — ABNORMAL HIGH (ref 11.5–15.5)
WBC Count: 24.6 10*3/uL — ABNORMAL HIGH (ref 4.0–10.5)
nRBC: 0.2 % (ref 0.0–0.2)

## 2021-07-17 LAB — FERRITIN: Ferritin: 31 ng/mL (ref 11–307)

## 2021-07-17 MED ORDER — HYDROXYUREA 500 MG PO CAPS: 500.0000 mg | ORAL_CAPSULE | Freq: Every day | ORAL | 3 refills | Status: DC
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
## 2021-07-18 ENCOUNTER — Telehealth: Payer: Self-pay | Admitting: Hematology

## 2021-07-18 NOTE — Telephone Encounter (Signed)
Left message with follow-up appointments per 7/28 los. 

## 2021-07-25 ENCOUNTER — Inpatient Hospital Stay: Payer: 59 | Attending: Hematology

## 2021-07-25 ENCOUNTER — Other Ambulatory Visit: Payer: Self-pay

## 2021-07-25 VITALS — BP 172/72 | HR 76 | Temp 98.4°F | Resp 18

## 2021-07-25 DIAGNOSIS — D45 Polycythemia vera: Secondary | ICD-10-CM | POA: Diagnosis present

## 2021-07-25 NOTE — Progress Notes (Signed)
[name] presents today for phlebotomy per MD orders. Hct49.1 Phlebotomy procedure started at 1040 and ended at 1048. 500 cc removed. Patient tolerated procedure well. IV needle removed intact.   Offered a snack.  Pt monitored for 30 mins and left at 1120 without any s/s of adverse reaction to the phlebotomy.

## 2021-07-25 NOTE — Patient Instructions (Signed)
Therapeutic Phlebotomy, Care After This sheet gives you information about how to care for yourself after your procedure. Your health care provider may also give you more specific instructions. If you have problems or questions, contact your health careprovider. What can I expect after the procedure? After the procedure, it is common to have: Light-headedness or dizziness. You may feel faint. Nausea. Tiredness (fatigue). Follow these instructions at home: Eating and drinking Be sure to eat well-balanced meals for the next 24 hours. Drink enough fluid to keep your urine pale yellow. Avoid drinking alcohol on the day that you had the procedure. Activity  Return to your normal activities as told by your health care provider. Most people can go back to their normal activities right away. Avoid activities that take a lot of effort for about 5 hours after the procedure. Athletes should avoid strenuous exercise for at least 12 hours. Avoid heavy lifting or pulling for about 5 hours after the procedure. Do not lift anything that is heavier than 10 lb (4.5 kg). Change positions slowly for the remainder of the day. This will help to prevent light-headedness or fainting. If you feel light-headed, lie down until the feeling goes away.  Needle insertion site care  Keep your bandage (dressing) dry. You can remove the bandage after about 5 hours or as told by your health care provider. If you have bleeding from the needle insertion site, raise (elevate) your arm and press firmly on the site until the bleeding stops. If you have bruising at the site, apply ice to the area: Remove the dressing. Put ice in a plastic bag. Place a towel between your skin and the bag. Leave the ice on for 20 minutes, 2-3 times a day for the first 24 hours. If the swelling does not go away after 24 hours, apply a warm, moist cloth (warm compress) to the area for 20 minutes, 2-3 times a day.  General instructions Do not use  any products that contain nicotine or tobacco, such as cigarettes and e-cigarettes, for at least 30 minutes after the procedure. Keep all follow-up visits as told by your health care provider. This is important. You may need to continue having regular therapeutic phlebotomy treatments as directed. Contact a health care provider if you: Have redness, swelling, or pain at the needle insertion site. Have fluid or blood coming from the needle insertion site. Have pus or a bad smell coming from the needle insertion site. Notice that the needle insertion site feels warm to the touch. Feel light-headed, dizzy, or nauseous, and the feeling does not go away. Have new bruising at the needle insertion site. Feel weaker than normal. Have a fever or chills. Get help right away if: You faint. You have chest pain. You have trouble breathing. You have severe nausea or vomiting. Summary After the procedure, it is common to have some light-headedness, dizziness, nausea, or tiredness (fatigue). Be sure to eat well-balanced meals for the next 24 hours. Drink enough fluid to keep your urine pale yellow. Return to your normal activities as told by your health care provider. Keep all follow-up visits as told by your health care provider. You may need to continue having regular therapeutic phlebotomy treatments as directed. This information is not intended to replace advice given to you by your health care provider. Make sure you discuss any questions you have with your healthcare provider. Document Revised: 12/24/2017 Document Reviewed: 12/23/2017 Elsevier Patient Education  2022 [employer]  

## 2021-08-11 ENCOUNTER — Other Ambulatory Visit: Payer: Self-pay | Admitting: Family

## 2021-08-11 DIAGNOSIS — Z8673 Personal history of transient ischemic attack (TIA), and cerebral infarction without residual deficits: Secondary | ICD-10-CM

## 2021-08-11 DIAGNOSIS — E782 Mixed hyperlipidemia: Secondary | ICD-10-CM

## 2021-08-11 DIAGNOSIS — I214 Non-ST elevation (NSTEMI) myocardial infarction: Secondary | ICD-10-CM

## 2021-08-11 DIAGNOSIS — M1712 Unilateral primary osteoarthritis, left knee: Secondary | ICD-10-CM

## 2021-08-18 ENCOUNTER — Other Ambulatory Visit: Payer: Self-pay

## 2021-08-18 ENCOUNTER — Inpatient Hospital Stay: Payer: 59

## 2021-08-18 DIAGNOSIS — D45 Polycythemia vera: Secondary | ICD-10-CM | POA: Diagnosis not present

## 2021-08-18 LAB — CBC WITH DIFFERENTIAL (CANCER CENTER ONLY)
Abs Immature Granulocytes: 0.37 10*3/uL — ABNORMAL HIGH (ref 0.00–0.07)
Basophils Absolute: 0.3 10*3/uL — ABNORMAL HIGH (ref 0.0–0.1)
Basophils Relative: 1 %
Eosinophils Absolute: 0.6 10*3/uL — ABNORMAL HIGH (ref 0.0–0.5)
Eosinophils Relative: 3 %
HCT: 44.2 % (ref 36.0–46.0)
Hemoglobin: 12.7 g/dL (ref 12.0–15.0)
Immature Granulocytes: 2 %
Lymphocytes Relative: 6 %
Lymphs Abs: 1.4 10*3/uL (ref 0.7–4.0)
MCH: 22.2 pg — ABNORMAL LOW (ref 26.0–34.0)
MCHC: 28.7 g/dL — ABNORMAL LOW (ref 30.0–36.0)
MCV: 77.3 fL — ABNORMAL LOW (ref 80.0–100.0)
Monocytes Absolute: 0.5 10*3/uL (ref 0.1–1.0)
Monocytes Relative: 2 %
Neutro Abs: 18 10*3/uL — ABNORMAL HIGH (ref 1.7–7.7)
Neutrophils Relative %: 86 %
Platelet Count: 507 10*3/uL — ABNORMAL HIGH (ref 150–400)
RBC: 5.72 MIL/uL — ABNORMAL HIGH (ref 3.87–5.11)
RDW: 23.3 % — ABNORMAL HIGH (ref 11.5–15.5)
Smear Review: INCREASED
WBC Count: 21.1 10*3/uL — ABNORMAL HIGH (ref 4.0–10.5)
nRBC: 0.1 % (ref 0.0–0.2)

## 2021-08-18 NOTE — Progress Notes (Signed)
Patient Hct 44.2. Did not meet criteria for phlebotomy. Patient stated that she feels fine and is happy to hear she can go home. A copy of the labs was provided to the patient and she is aware of her upcoming appts. No other questions or concerns.

## 2021-09-02 ENCOUNTER — Other Ambulatory Visit: Payer: Self-pay | Admitting: Nurse Practitioner

## 2021-09-02 DIAGNOSIS — I482 Chronic atrial fibrillation, unspecified: Secondary | ICD-10-CM

## 2021-09-02 DIAGNOSIS — K219 Gastro-esophageal reflux disease without esophagitis: Secondary | ICD-10-CM

## 2021-09-02 MED ORDER — OMEPRAZOLE 20 MG PO CPDR: DELAYED_RELEASE_CAPSULE | ORAL | 0 refills | Status: DC

## 2021-09-03 ENCOUNTER — Other Ambulatory Visit: Payer: Self-pay

## 2021-09-03 ENCOUNTER — Encounter: Payer: Self-pay | Admitting: Nurse Practitioner

## 2021-09-03 ENCOUNTER — Ambulatory Visit (INDEPENDENT_AMBULATORY_CARE_PROVIDER_SITE_OTHER): Payer: 59 | Admitting: Nurse Practitioner

## 2021-09-03 VITALS — BP 110/59 | HR 71 | Temp 97.4°F | Ht 64.0 in | Wt 138.0 lb

## 2021-09-03 DIAGNOSIS — M25532 Pain in left wrist: Secondary | ICD-10-CM

## 2021-09-03 DIAGNOSIS — M25531 Pain in right wrist: Secondary | ICD-10-CM | POA: Diagnosis not present

## 2021-09-03 HISTORY — DX: Pain in right wrist: M25.532

## 2021-09-03 HISTORY — DX: Pain in right wrist: M25.531

## 2021-09-03 MED ORDER — DICLOFENAC SODIUM 1 % EX GEL: 2.0000 g | Freq: Four times a day (QID) | CUTANEOUS | 0 refills | Status: DC

## 2021-09-03 NOTE — Progress Notes (Signed)
Acute Office Visit  Subjective:    Patient ID: [name], female    DOB: 1951/11/25, 70 y.o.   MRN: [id number]  Chief Complaint  Patient presents with   Wrist Pain    HPI Patient is in today for Wrist Pain: Patient complaints of bilateral wrist pain. This is evaluated as a personal injury. The pain began a few weeks ago. The pain is located primarily in the ulna area.  She describes the symptoms as aching. Symptoms improve with medication: Tramadol used but not effective. The symptoms are worse with movement. The patient  has no neck pain. The patient is active in none. Treatment to date has been heat, without significant relief.   Past Medical History:  Diagnosis Date   Arthritis    "fingers, some in my knees" (01/28/2016)   CAD (coronary artery disease), per cath 04/27/13, non obstructive disease, EF 65-70% 05/11/2013   Carotid disease, bilateral ([place]) 09/2013   Lt carotid 50-69% stentosis, less on rt   Dysrhythmia    Fatty liver    GERD (gastroesophageal reflux disease)    Glaucoma    H/O cardiovascular stress test 12/27/2009   negative for ischemia   Headache    "occasional since eye pressure regulated" (01/28/2016)   Hyperlipidemia    Hypertension    Leukocytosis 07/15/2015   Migraine    "stopped w/laser holes to relieve pressure in my eyes; had them 3-4 times/wk before taht" (01/28/2016)   Mitral regurgitation,2+ by cath 05/11/2013   NSTEMI (non-ST elevated myocardial infarction) ([place]) 04/27/2013   Pain    BOTH KNEES - PT HAS TORN MENISCUS LEFT KNEE   Paroxysmal atrial fibrillation (HCC)    hx of a fib on ASA  AND ELIQUIS    Polycythemia vera ([place]) 08/20/2015   JAK-2 positive 07/19/15   PVD (peripheral vascular disease) with claudication ([place]) 07/2006   previous L ext iliac stent and rt SFA stent, known occluded Lt SFA   S/P cardiac cath 04/27/13   NON OBSTRUCTIVE DISEASE, 2+ mr, ef 65-70%   Statin intolerance    Stroke ([place]) 2006   SWELLING ALL OVER AND RT SIDE OF FACE DRAWN  AND SPEECH SLURRED AND NUMBNESS ON RIGHT SIDE-- ALL RESOLVED    Past Surgical History:  Procedure Laterality Date   AMPUTATION Right 05/21/2017   Procedure: AMPUTATION ABOVE KNEE;  Surgeon: Rosetta Posner, MD;  Location: [facility] [place] [facility];  Service: Vascular;  Laterality: Right;   CARDIAC CATHETERIZATION  04/27/13   non occlusive disease with mild to mod. calcified lesions in ostial LM and proximal LAD and moderate prox. RC AND DISTAL AV GROOVE LCX, ef   CATARACT EXTRACTION W/PHACO Right 03/21/2015   Procedure: CATARACT EXTRACTION PHACO AND INTRAOCULAR LENS PLACEMENT RIGHT EYE;  Surgeon: Baruch Goldmann, MD;  Location: [facility];  Service: Ophthalmology;  Laterality: Right;  CDE:5.10   CATARACT EXTRACTION W/PHACO Left 05/16/2015   Procedure: CATARACT EXTRACTION PHACO AND INTRAOCULAR LENS PLACEMENT (IOC);  Surgeon: Baruch Goldmann, MD;  Location: [facility];  Service: Ophthalmology;  Laterality: Left;  CDE 5.57   CESAREAN SECTION  1977   COLONOSCOPY N/A 09/11/2016   Procedure: COLONOSCOPY;  Surgeon: Rogene Houston, MD;  [facility];  Service: Endoscopy;  Laterality: N/A;  730-moved to 1:00 Ann notified pt   EMBOLECTOMY Right 02/01/2016   Procedure: Thrombectomy of Right Femoral-Popliteal Bypass Graft; Endarterectomy of Right Below Knee Popliteal Artery and Tibial Peroneal Trunk with Bovine Pericardium Patch Angioplasty.;  Surgeon: Angelia Mould, MD;  [facility];  Service: Vascular;  Laterality: Right;   FEMORAL-POPLITEAL BYPASS GRAFT Right 01/31/2016   Procedure: BYPASS GRAFT RIGHT COMMON  FEMORALTO BELOW KNEE POPLITEAL ARTERY BYPASS GRAFT USING 6MM PROPATEN GORTEX GRAFT;  Surgeon: Mal Misty, MD;  Location: [facility];  Service: Vascular;  Laterality: Right;   FEMORAL-POPLITEAL BYPASS GRAFT Right 01/09/2017   Procedure: THROMBECTOMY OF RIGHT FEMORAL-POPLITEAL  ARTERY BYPASS GRAFT; THROMBECTOMY RIGHT  TIBIAL VESSELS;  Surgeon: [name], MD;  Location: [facility];  Service: Vascular;  Laterality: Right;    FEMORAL-POPLITEAL BYPASS GRAFT Right 05/17/2017   Procedure: RIGHT FEMORAL-TIBIAL PERONEAL TRUNK ARTERY BYPASS GRAFT USING 55mX80cm PROPATEN GRAFT WITH REMOVABLE RING;  Surgeon: ERosetta Posner MD;  [place]  Service: Vascular;  Laterality: Right;   FRACTURE SURGERY     ILIAC ARTERY STENT  07/2006   external iliac and Rt SFA stent 07/2006 AND THE LEFT WAS IN 2006   KNEE ARTHROSCOPY WITH MEDIAL MENISECTOMY Left 03/27/2014   Procedure: left knee arthorscopy with medial chondraplasty of the medial femoral and patella, medial microfracture technique of medial femoral condyl;  Surgeon: RTobi Bastos MD;  [facility];  Service: Orthopedics;  Laterality: Left;   LEFT HEART CATHETERIZATION WITH CORONARY ANGIOGRAM Right 04/27/2013   Procedure: LEFT HEART CATHETERIZATION WITH CORONARY ANGIOGRAM;  Surgeon: DLeonie Man MD;  [facility];  Service: Cardiovascular;  Laterality: Right;   LOWER EXTREMITY ANGIOGRAM Right 01/31/2016   Procedure: INTRAOP RIGHT LOWER EXTREMITY ANGIOGRAM;  Surgeon: JMal Misty MD;  [facility]  Service: Vascular;  Laterality: Right;   ORIF WRIST FRACTURE Right 1983   OVARIAN CYST REMOVAL Right    PATCH ANGIOPLASTY Right 01/31/2016   Procedure: RIGHT COMMON FEMORAL AND PROFUNDA FEMORIS ENDARECTOMY WITH PATCH ANGIOPLASTY;  Surgeon: JMal Misty MD;  [facility]  Service: Vascular;  Laterality: Right;   PATCH ANGIOPLASTY Right 01/09/2017   Procedure: PATCH ANGIOPLASTY RIGHT POPLITEAL ARTERY BYPASS GRAFT;  Surgeon: CElam Dutch MD;  [place] [facility]  Service: Vascular;  Laterality: Right;   PERIPHERAL VASCULAR CATHETERIZATION N/A 06/27/2015   Procedure: Lower Extremity Angiography;  Surgeon: JLorretta Harp MD;  [facility];  Service: Cardiovascular;  Laterality: N/A;   PERIPHERAL VASCULAR CATHETERIZATION Bilateral 01/29/2016   Procedure: Lower Extremity Angiography;  Surgeon: MWellington Hampshire MD;  [facility];   Service: Cardiovascular;  Laterality: Bilateral;   PERIPHERAL VASCULAR CATHETERIZATION N/A 01/29/2016   Procedure: Abdominal Aortogram;  Surgeon: MWellington Hampshire MD;  [facility];  Service: Cardiovascular;  Laterality: N/A;   REFRACTIVE SURGERY Bilateral    "6 in one eye; 7 in the other; to relieve pressure; not glaucoma" (01/28/2016)   SFA Right 06/27/2015   overlapping Bahn covered stents   [place] 05/17/2017   Procedure: LEFT LEG GREATER SAPHENOUS VEIN HARVEST;  Surgeon: ERosetta Posner MD;  [place]  Service: Vascular;  Laterality: Left;   VEIN REPAIR Right 01/31/2016   Procedure: RIGHT GREATER SAPHENOUS VEIN EXAMNED BUT NOT REMOVED;  Surgeon: JMal Misty MD;  [facility];  Service: Vascular;  Laterality: Right;    Family History  Problem Relation Age of Onset   CAD Mother    Lung cancer Mother    Heart attack Mother    CAD Father    Heart disease Father    Heart attack Father 448  died in hes 28's with heart disease   Healthy Sister    Liver cancer Brother    Healthy Sister    CAD Brother        has had CABG   CAD Brother     Social History   Socioeconomic History   Marital status: Widowed    Spouse name: Not on file   Number of children: 2   Years of education: 47   Highest education level: Not on file  Occupational History   Occupation: CNA  Tobacco Use   Smoking status: Former    Packs/day: 1.00    Years: 45.00    Pack years: 45.00    Types: Cigarettes    Quit date: 11/11/2012    Years since quitting: 8.8   Smokeless tobacco: Never   Tobacco comments:    quit 4 years ago  Vaping Use   Vaping Use: Never used  Substance and Sexual Activity   Alcohol use: No    Alcohol/week: 0.0 standard drinks   Drug use: No   Sexual activity: Not Currently    Birth control/protection: None  Other Topics Concern   Not on file  Social History Narrative   Not on file   Social Determinants of Health    Financial Resource Strain: Not on file  Food Insecurity: Not on file  Transportation Needs: Not on file  Physical Activity: Not on file  Stress: Not on file  Social Connections: Not on file  Intimate Partner Violence: Not on file    Outpatient Medications Prior to Visit  Medication Sig Dispense Refill   acetaminophen (TYLENOL) 500 MG tablet Take 2 tablets (1,000 mg total) by mouth every 6 (six) hours. 30 tablet 0   alendronate (FOSAMAX) 70 MG tablet TAKE 1 TABLET ONCE A WEEK 4 tablet 2   ARTIFICIAL TEAR OP Place 1 drop into both eyes as needed (for dry eyes).     aspirin 325 MG EC tablet Take by mouth.     aspirin EC 325 MG tablet Take 325 mg by mouth daily.     calcium-vitamin D (OSCAL WITH D) 500-200 MG-UNIT tablet Take 1 tablet by mouth daily with breakfast. 90 tablet 1   chlorthalidone (HYGROTON) 25 MG tablet Take 1 tablet (25 mg total) by mouth daily. 30 tablet 5   diltiazem (CARDIZEM CD) 240 MG 24 hr capsule TAKE 1 CAPSULE EVERY MORNING 90 capsule 0   diltiazem (TIAZAC) 240 MG 24 hr capsule Take by mouth.     ELIQUIS 5 MG TABS tablet TAKE ONE TABLET TWICE DAILY 60 tablet 0   escitalopram (LEXAPRO) 20 MG tablet TAKE ONE TABLET ONCE DAILY 30 tablet 0   ezetimibe (ZETIA) 10 MG tablet TAKE ONE TABLET ONCE DAILY 30 tablet 0   fenofibrate (TRICOR) 145 MG tablet Take 1 tablet (145 mg total) by mouth daily. (NEEDS TO BE SEEN BEFORE NEXT REFILL) 30 tablet 0   fenofibrate (TRICOR) 145 MG tablet Take by mouth.     fenofibrate (TRICOR) 145 MG tablet TAKE ONE TABLET ONCE DAILY 30 tablet 0   flecainide (TAMBOCOR) 50 MG tablet TAKE 1 TABLET 2 TIMES A DAY 60 tablet 2   hydroxyurea (HYDREA) 500 MG capsule Take by mouth.     hydroxyurea (HYDREA) 500 MG capsule Take 1 capsule (500 mg total) by mouth daily. Take one capsule daily except Sundays. May take with food to minimize GI side effects. 30 capsule 3   lisinopril (ZESTRIL) 20 MG tablet Take by mouth.  mupirocin ointment (BACTROBAN) 2 %  APPLY TO AFFECTED AREA 3)TIMES A DAY FOR 1 WEEK     omeprazole (PRILOSEC) 20 MG capsule TAKE 1 CAPSULE 2 TIMES A DAY BEFORE A MEAL 180 capsule 0   traMADol (ULTRAM) 50 MG tablet Take 1 tablet (50 mg total) by mouth 2 (two) times daily. 60 tablet 0   traMADol (ULTRAM) 50 MG tablet Take 1 tablet (50 mg total) by mouth every 8 (eight) hours as needed. 60 tablet 2   triamcinolone cream (KENALOG) 0.1 % Apply 1 g topically 1 day or 1 dose.     valsartan (DIOVAN) 160 MG tablet TAKE ONE TABLET ONCE DAILY 60 tablet 4   predniSONE (DELTASONE) 5 MG tablet Take by mouth.     No facility-administered medications prior to visit.    Allergies  Allergen Reactions   Atenolol Rash and Itching   Demerol  [Meperidine Hcl] Itching   Demerol [Meperidine] Other (See Comments) and Itching    Unknown, can't remember  Unknown, can't remember    Gabapentin Anxiety and Other (See Comments)    SI and HI   Inderal [Propranolol] Other (See Comments) and Itching    Doesn't remember    Prednisone Other (See Comments)    Muscle spasms   Statins Itching and Other (See Comments)    Simvastatin-caused severe itching Pravastatin-caused lesser tiching One type of statin? Caused stroke in 2006, and other symptoms as result   Warfarin And Related Other (See Comments)    Caused nose bleeds   Crestor [Rosuvastatin] Itching and Rash   Docosahexaenoic Acid (Dha)     Other reaction(s): Other (See Comments) nosebleeds   Lactose Intolerance (Gi) Other (See Comments)    Bloating, gas   Lipitor [Atorvastatin] Rash   Lovaza [Omega-3-Acid Ethyl Esters] Other (See Comments)    nosebleeds    Penicillins Hives, Rash and Other (See Comments)    Has patient had a PCN reaction causing immediate rash, facial/tongue/throat swelling, SOB or lightheadedness with hypotension: Yes Has patient had a PCN reaction causing severe rash involving mucus membranes or skin necrosis: No Has patient had a PCN reaction that required  hospitalization: No Has patient had a PCN reaction occurring within the last 10 years: No If all of the above answers are "NO", then may proceed with Cephalosporin use.  Questionable high fever    Pravastatin Itching and Rash   Simvastatin Itching, Rash and Other (See Comments)   Trilipix [Choline Fenofibrate] Other (See Comments)    Doesn't remember    Warfarin Other (See Comments)    nosebleeds   Hydralazine Swelling   Effexor [Venlafaxine Hcl] Other (See Comments)    Doesn't remember    Nexium [Esomeprazole Magnesium] Rash   Percocet [Oxycodone-Acetaminophen] Other (See Comments)    Doesn't remember    Plavix [Clopidogrel Bisulfate] Rash    Review of Systems  Constitutional: Negative.   HENT: Negative.    Eyes: Negative.   Respiratory: Negative.    Cardiovascular: Negative.   Gastrointestinal: Negative.   Genitourinary: Negative.   Musculoskeletal:  Positive for joint swelling.  Skin:  Negative for rash.  All other systems reviewed and are negative.     Objective:    Physical Exam Vitals and nursing note reviewed.  Constitutional:      Appearance: Normal appearance.  HENT:     Head: Normocephalic.     Nose: Nose normal.  Eyes:     Conjunctiva/sclera: Conjunctivae normal.  Cardiovascular:     Rate and Rhythm:  Normal rate and regular rhythm.     Pulses: Normal pulses.     Heart sounds: Normal heart sounds.  Pulmonary:     Effort: Pulmonary effort is normal.     Breath sounds: Normal breath sounds.  Abdominal:     General: Bowel sounds are normal.  Musculoskeletal:     Right wrist: Swelling, deformity and tenderness present. Decreased range of motion.     Left wrist: Swelling, deformity and tenderness present. Decreased range of motion.  Skin:    Findings: No rash.  Neurological:     Mental Status: She is alert and oriented to person, place, and time.    BP (!) 110/59   Pulse 71   Temp (!) 97.4 F (36.3 C) (Temporal)   Ht '5\' 4"'$  (1.626 m)   Wt 138 lb  (62.6 kg)   SpO2 97%   BMI 23.69 kg/m  Wt Readings from Last 3 Encounters:  09/03/21 138 lb (62.6 kg)  07/17/21 133 lb 1.6 oz (60.4 kg)  06/04/21 134 lb 3.2 oz (60.9 kg)    Health Maintenance Due  Topic Date Due   COVID-19 Vaccine (1) Never done   Hepatitis C Screening  Never done   INFLUENZA VACCINE  Never done    There are no preventive care reminders to display for this patient.   Lab Results  Component Value Date   TSH 1.550 06/28/2020   Lab Results  Component Value Date   WBC 21.1 (H) 08/18/2021   HGB 12.7 08/18/2021   HCT 44.2 08/18/2021   MCV 77.3 (L) 08/18/2021   PLT 507 (H) 08/18/2021   Lab Results  Component Value Date   NA 136 11/24/2019   K 4.8 11/24/2019   CO2 18 (L) 11/24/2019   GLUCOSE 89 11/24/2019   BUN 21 11/24/2019   CREATININE 0.93 11/24/2019   BILITOT 0.2 08/15/2019   ALKPHOS 62 08/15/2019   AST 18 08/15/2019   ALT 20 08/15/2019   PROT 6.8 08/15/2019   ALBUMIN 4.1 08/15/2019   CALCIUM 9.0 11/24/2019   ANIONGAP 10 09/12/2018   Lab Results  Component Value Date   CHOL 171 06/04/2021   Lab Results  Component Value Date   HDL 33 (L) 06/04/2021   Lab Results  Component Value Date   LDLCALC 88 06/04/2021   Lab Results  Component Value Date   TRIG 298 (H) 06/04/2021   Lab Results  Component Value Date   CHOLHDL 5.2 (H) 06/04/2021   Lab Results  Component Value Date   HGBA1C 5.7 (H) 01/28/2016       Assessment & Plan:   Problem List Items Addressed This Visit       Other   Pain in both wrists - Primary    Bilateral wrist pain not well controlled.  Symptoms started in the past few weeks.  And radiating down arms to the elbows.  Patient is unable to take anti-inflammatory due to Eliquis.  She has tramadol 50 mg tablet by mouth every 8 hours as needed, diclofenac topical cream.  Patient knows to follow-up with worsening or unresolved symptoms.  Education given to patient.  Rx sent to pharmacy.            No orders  of the defined types were placed in this encounter.    Ivy Lynn, NP

## 2021-09-03 NOTE — Assessment & Plan Note (Signed)
Bilateral wrist pain not well controlled.  Symptoms started in the past few weeks.  And radiating down arms to the elbows.  Patient is unable to take anti-inflammatory due to Eliquis.  She has tramadol 50 mg tablet by mouth every 8 hours as needed, diclofenac topical cream.  Patient knows to follow-up with worsening or unresolved symptoms.  Education given to patient.  Rx sent to pharmacy.

## 2021-09-05 ENCOUNTER — Ambulatory Visit: Payer: 59 | Admitting: Nurse Practitioner

## 2021-09-08 DIAGNOSIS — T466X5A Adverse effect of antihyperlipidemic and antiarteriosclerotic drugs, initial encounter: Secondary | ICD-10-CM

## 2021-09-08 NOTE — Progress Notes (Signed)
[facility])                                            [place] Team                                        Statin Quality Measure Assessment    09/08/2021  [name] 1951/02/21 [id number]  NP Christiana Pellant   I am a [facility] clinical pharmacist that reviews patients for statin quality initiatives.     Per review of chart and payor information, patient has a diagnosis of cardiovascular disease but is not currently filling a statin prescription.  This places patient into the [facility] (Statin Use in Patients with Cardiovascular Disease) measures for CMS.    Patient is statin intolerant and does not have an eligible exclusion code documented for 03-13-2021. She was coded for adverse effect of antihyperlipidemic drug [id number]) last year.      Component Value Date/Time   CHOL 171 06/04/2021 1436   TRIG 298 (H) 06/04/2021 1436   HDL 33 (L) 06/04/2021 1436   CHOLHDL 5.2 (H) 06/04/2021 1436   CHOLHDL 4.3 06/25/2015 0950   VLDL 72 (H) 06/25/2015 0950   LDLCALC 88 06/04/2021 1436    Please consider ONE of the following recommendations:  Initiate high intensity statin Atorvastatin '40mg'$  once daily, #90, 3 refills   Rosuvastatin '20mg'$  once daily, #90, 3 refills    Initiate moderate intensity  statin with reduced frequency if prior  statin intolerance 1x weekly, #13, 3 refills   2x weekly, #26, 3 refills   3x weekly, #39, 3 refills    Code for past statin intolerance  (required annually)  Provider Requirements: Must associate code during an office visit or telehealth encounter   Drug Induced Myopathy G72.0   Myositis, unspecified M60.9   Myopathy, unspecified G72.9   Adverse effect of antihyperlipidemic and anti-arteriosclerotic drug  0000000   Alcoholic cirrhosis of liver without ascites 0000000   Alcoholic cirrhosis of liver with ascites K70.31   Unspecified cirrhosis of liver K74.60   Toxic liver disease with fibrosis and  cirrhosis of liver K71.7      Please let us know your decision.    Thank you!   Reed Breech, PharmD Clinical Pharmacist  [place] [phone]

## 2021-09-09 ENCOUNTER — Other Ambulatory Visit: Payer: Self-pay

## 2021-09-09 ENCOUNTER — Encounter: Payer: Self-pay | Admitting: Nurse Practitioner

## 2021-09-09 ENCOUNTER — Ambulatory Visit (INDEPENDENT_AMBULATORY_CARE_PROVIDER_SITE_OTHER): Payer: 59 | Admitting: Nurse Practitioner

## 2021-09-09 VITALS — BP 173/54 | HR 74 | Temp 98.4°F | Ht 64.0 in | Wt 137.0 lb

## 2021-09-09 DIAGNOSIS — E782 Mixed hyperlipidemia: Secondary | ICD-10-CM | POA: Diagnosis not present

## 2021-09-09 DIAGNOSIS — M5136 Other intervertebral disc degeneration, lumbar region: Secondary | ICD-10-CM

## 2021-09-09 DIAGNOSIS — I10 Essential (primary) hypertension: Secondary | ICD-10-CM | POA: Diagnosis not present

## 2021-09-09 NOTE — Assessment & Plan Note (Signed)
Pain management on current medication no changes necessary.

## 2021-09-09 NOTE — Patient Instructions (Signed)
Cholesterol Content in Foods Cholesterol is a waxy, fat-like substance that helps to carry fat in the blood. The body needs cholesterol in small amounts, but too much cholesterol can cause damage to the arteries and heart. Most people should eat less than 200 milligrams (mg) of cholesterol a day. Foods with cholesterol Cholesterol is found in animal-based foods, such as meat, seafood, and dairy. Generally, low-fat dairy and lean meats have less cholesterol than full-fat dairy and fatty meats. The milligrams of cholesterol per serving (mg per serving) of common cholesterol-containing foods are listed below. Meat and other proteins Egg -- one large whole egg has 186 mg. Veal shank -- 4 oz has 141 mg. Lean ground turkey (93% lean) -- 4 oz has 118 mg. Fat-trimmed lamb loin -- 4 oz has 106 mg. Lean ground beef (90% lean) -- 4 oz has 100 mg. Lobster -- 3.5 oz has 90 mg. Pork loin chops -- 4 oz has 86 mg. Canned salmon -- 3.5 oz has 83 mg. Fat-trimmed beef top loin -- 4 oz has 78 mg. Frankfurter -- 1 frank (3.5 oz) has 77 mg. Crab -- 3.5 oz has 71 mg. Roasted chicken without skin, white meat -- 4 oz has 66 mg. Light bologna -- 2 oz has 45 mg. Deli-cut turkey -- 2 oz has 31 mg. Canned tuna -- 3.5 oz has 31 mg. Bacon -- 1 oz has 29 mg. Oysters and mussels (raw) -- 3.5 oz has 25 mg. Mackerel -- 1 oz has 22 mg. Trout -- 1 oz has 20 mg. Pork sausage -- 1 link (1 oz) has 17 mg. Salmon -- 1 oz has 16 mg. Tilapia -- 1 oz has 14 mg. Dairy Soft-serve ice cream --  cup (4 oz) has 103 mg. Whole-milk yogurt -- 1 cup (8 oz) has 29 mg. Cheddar cheese -- 1 oz has 28 mg. American cheese -- 1 oz has 28 mg. Whole milk -- 1 cup (8 oz) has 23 mg. 2% milk -- 1 cup (8 oz) has 18 mg. Cream cheese -- 1 tablespoon (Tbsp) has 15 mg. Cottage cheese --  cup (4 oz) has 14 mg. Low-fat (1%) milk -- 1 cup (8 oz) has 10 mg. Sour cream -- 1 Tbsp has 8.5 mg. Low-fat yogurt -- 1 cup (8 oz) has 8 mg. Nonfat Greek  yogurt -- 1 cup (8 oz) has 7 mg. Half-and-half cream -- 1 Tbsp has 5 mg. Fats and oils Cod liver oil -- 1 tablespoon (Tbsp) has 82 mg. Butter -- 1 Tbsp has 15 mg. Lard -- 1 Tbsp has 14 mg. Bacon grease -- 1 Tbsp has 14 mg. Mayonnaise -- 1 Tbsp has 5-10 mg. Margarine -- 1 Tbsp has 3-10 mg. Exact amounts of cholesterol in these foods may vary depending on specific ingredients and brands. Foods without cholesterol Most plant-based foods do not have cholesterol unless you combine them with a food that has cholesterol. Foods without cholesterol include: Grains and cereals. Vegetables. Fruits. Vegetable oils, such as olive, canola, and sunflower oil. Legumes, such as peas, beans, and lentils. Nuts and seeds. Egg whites. Summary The body needs cholesterol in small amounts, but too much cholesterol can cause damage to the arteries and heart. Most people should eat less than 200 milligrams (mg) of cholesterol a day. This information is not intended to replace advice given to you by your health care provider. Make sure you discuss any questions you have with your health care provider. Document Revised: 03/19/2020 Document Reviewed: 04/29/2020 Elsevier Patient Education    2022 [place]. Hypertension, Adult Hypertension is another name for high blood pressure. High blood pressure forces your heart to work harder to pump blood. This can cause problems over time. There are two numbers in a blood pressure reading. There is a top number (systolic) over a bottom number (diastolic). It is best to have a blood pressure that is below 120/80. Healthy choices can help lower your blood pressure, or you may need medicine to help lower it. What are the causes? The cause of this condition is not known. Some conditions may be related to high blood pressure. What increases the risk? Smoking. Having type 2 diabetes mellitus, high cholesterol, or both. Not getting enough exercise or physical activity. Being  overweight. Having too much fat, sugar, calories, or salt (sodium) in your diet. Drinking too much alcohol. Having long-term (chronic) kidney disease. Having a family history of high blood pressure. Age. Risk increases with age. Race. You may be at higher risk if you are African American. Gender. Men are at higher risk than women before age 19. After age 23, women are at higher risk than men. Having obstructive sleep apnea. Stress. What are the signs or symptoms? High blood pressure may not cause symptoms. Very high blood pressure (hypertensive crisis) may cause: Headache. Feelings of worry or nervousness (anxiety). Shortness of breath. Nosebleed. A feeling of being sick to your stomach (nausea). Throwing up (vomiting). Changes in how you see. Very bad chest pain. Seizures. How is this treated? This condition is treated by making healthy lifestyle changes, such as: Eating healthy foods. Exercising more. Drinking less alcohol. Your health care provider may prescribe medicine if lifestyle changes are not enough to get your blood pressure under control, and if: Your top number is above 130. Your bottom number is above 80. Your personal target blood pressure may vary. Follow these instructions at home: Eating and drinking  If told, follow the DASH eating plan. To follow this plan: Fill one half of your plate at each meal with fruits and vegetables. Fill one fourth of your plate at each meal with whole grains. Whole grains include whole-wheat pasta, brown rice, and whole-grain bread. Eat or drink low-fat dairy products, such as skim milk or low-fat yogurt. Fill one fourth of your plate at each meal with low-fat (lean) proteins. Low-fat proteins include fish, chicken without skin, eggs, beans, and tofu. Avoid fatty meat, cured and processed meat, or chicken with skin. Avoid pre-made or processed food. Eat less than 1,500 mg of salt each day. Do not drink alcohol if: Your doctor  tells you not to drink. You are pregnant, may be pregnant, or are planning to become pregnant. If you drink alcohol: Limit how much you use to: 0-1 drink a day for women. 0-2 drinks a day for men. Be aware of how much alcohol is in your drink. In the [place] one drink equals one 12 oz bottle of beer (355 mL), one 5 oz glass of wine (148 mL), or one 1 oz glass of hard liquor (44 mL). Lifestyle  Work with your doctor to stay at a healthy weight or to lose weight. Ask your doctor what the best weight is for you. Get at least 30 minutes of exercise most days of the week. This may include walking, swimming, or biking. Get at least 30 minutes of exercise that strengthens your muscles (resistance exercise) at least 3 days a week. This may include lifting weights or doing Pilates. Do not use any products that contain  nicotine or tobacco, such as cigarettes, e-cigarettes, and chewing tobacco. If you need help quitting, ask your doctor. Check your blood pressure at home as told by your doctor. Keep all follow-up visits as told by your doctor. This is important. Medicines Take over-the-counter and prescription medicines only as told by your doctor. Follow directions carefully. Do not skip doses of blood pressure medicine. The medicine does not work as well if you skip doses. Skipping doses also puts you at risk for problems. Ask your doctor about side effects or reactions to medicines that you should watch for. Contact a doctor if you: Think you are having a reaction to the medicine you are taking. Have headaches that keep coming back (recurring). Feel dizzy. Have swelling in your ankles. Have trouble with your vision. Get help right away if you: Get a very bad headache. Start to feel mixed up (confused). Feel weak or numb. Feel faint. Have very bad pain in your: Chest. Belly (abdomen). Throw up more than once. Have trouble breathing. Summary Hypertension is another name for high blood  pressure. High blood pressure forces your heart to work harder to pump blood. For most people, a normal blood pressure is less than 120/80. Making healthy choices can help lower blood pressure. If your blood pressure does not get lower with healthy choices, you may need to take medicine. This information is not intended to replace advice given to you by your health care provider. Make sure you discuss any questions you have with your health care provider. Document Revised: 08/17/2018 Document Reviewed: 08/17/2018 Elsevier Patient Education  [place].

## 2021-09-09 NOTE — Assessment & Plan Note (Signed)
Uncontrolled hypertension. Patient is managed by cardiologist and have an upcoming appointment. Continue healthy low sodium diet. And follow up in 3 months.

## 2021-09-09 NOTE — Assessment & Plan Note (Signed)
Completed lipid panel, results pending. Will treat patient based on lab results, continue low cholesterol diet.  Follow-up in 3 months.

## 2021-09-09 NOTE — Progress Notes (Signed)
Established Patient Office Visit  Subjective:  Patient ID: [name], female    DOB: 1951-01-02  Age: 70 y.o. MRN: [id number]  CC:  Chief Complaint  Patient presents with   Medical Management of Chronic Issues    HPI [name] presents for follow up of hypertension. Patient was diagnosed in 01/08/2016 The patient is tolerating the medication well without side effects. Compliance with treatment has been good; including taking medication as directed, and following up as directed. Recent changes in elevated systolic blood pressure reading. Patient has an up coming appointment with cardiology.   Mixed hyperlipidemia  Pt presents with hyperlipidemia. Patient was diagnosed in .  2014 compliance with treatment has been good the patient is compliant with medications, but is unable to tolerate statin, maintains a low cholesterol diet , follows up as directed , and maintains an exercise regimen . The patient denies experiencing any hypercholesterolemia related symptoms.    Past Medical History:  Diagnosis Date   Arthritis    "fingers, some in my knees" (01/28/2016)   CAD (coronary artery disease), per cath 04/27/13, non obstructive disease, EF 65-70% 05/11/2013   Carotid disease, bilateral ([place]) 09/2013   Lt carotid 50-69% stentosis, less on rt   Dysrhythmia    Fatty liver    GERD (gastroesophageal reflux disease)    Glaucoma    H/O cardiovascular stress test 12/27/2009   negative for ischemia   Headache    "occasional since eye pressure regulated" (01/28/2016)   Hyperlipidemia    Hypertension    Leukocytosis 07/15/2015   Migraine    "stopped w/laser holes to relieve pressure in my eyes; had them 3-4 times/wk before taht" (01/28/2016)   Mitral regurgitation,2+ by cath 05/11/2013   NSTEMI (non-ST elevated myocardial infarction) ([place]) 04/27/2013   Pain    BOTH KNEES - PT HAS TORN MENISCUS LEFT KNEE   Paroxysmal atrial fibrillation (HCC)    hx of a fib on ASA  AND ELIQUIS    Polycythemia  vera ([place]) 08/20/2015   JAK-2 positive 07/19/15   PVD (peripheral vascular disease) with claudication ([place]) 07/2006   previous L ext iliac stent and rt SFA stent, known occluded Lt SFA   S/P cardiac cath 04/27/13   NON OBSTRUCTIVE DISEASE, 2+ mr, ef 65-70%   Statin intolerance    Stroke ([place]) 2006   SWELLING ALL OVER AND RT SIDE OF FACE DRAWN AND SPEECH SLURRED AND NUMBNESS ON RIGHT SIDE-- ALL RESOLVED    Past Surgical History:  Procedure Laterality Date   AMPUTATION Right 05/21/2017   Procedure: AMPUTATION ABOVE KNEE;  Surgeon: Rosetta Posner, MD;  Location: [facility];  Service: Vascular;  Laterality: Right;   CARDIAC CATHETERIZATION  04/27/13   non occlusive disease with mild to mod. calcified lesions in ostial LM and proximal LAD and moderate prox. RC AND DISTAL AV GROOVE LCX, ef   CATARACT EXTRACTION W/PHACO Right 03/21/2015   Procedure: CATARACT EXTRACTION PHACO AND INTRAOCULAR LENS PLACEMENT RIGHT EYE;  Surgeon: Baruch Goldmann, MD;  Location: [facility];  Service: Ophthalmology;  Laterality: Right;  CDE:5.10   CATARACT EXTRACTION W/PHACO Left 05/16/2015   Procedure: CATARACT EXTRACTION PHACO AND INTRAOCULAR LENS PLACEMENT (IOC);  Surgeon: Baruch Goldmann, MD;  Location: [facility];  Service: Ophthalmology;  Laterality: Left;  CDE 5.57   CESAREAN SECTION  1977   COLONOSCOPY N/A 09/11/2016   Procedure: COLONOSCOPY;  Surgeon: Rogene Houston, MD;  [facility];  Service: Endoscopy;  Laterality: N/A;  730-moved to  1:00 Ann notified pt   EMBOLECTOMY Right 02/01/2016   Procedure: Thrombectomy of Right Femoral-Popliteal Bypass Graft; Endarterectomy of Right Below Knee Popliteal Artery and Tibial Peroneal Trunk with Bovine Pericardium Patch Angioplasty.;  Surgeon: Angelia Mould, MD;  Location: [facility];  Service: Vascular;  Laterality: Right;   FEMORAL-POPLITEAL BYPASS GRAFT Right 01/31/2016   Procedure: BYPASS GRAFT RIGHT COMMON  FEMORALTO BELOW KNEE POPLITEAL ARTERY BYPASS GRAFT USING 6MM PROPATEN  GORTEX GRAFT;  Surgeon: Mal Misty, MD;  Location: [facility];  Service: Vascular;  Laterality: Right;   FEMORAL-POPLITEAL BYPASS GRAFT Right 01/09/2017   Procedure: THROMBECTOMY OF RIGHT FEMORAL-POPLITEAL  ARTERY BYPASS GRAFT; THROMBECTOMY RIGHT  TIBIAL VESSELS;  Surgeon: [name], MD;  Location: [facility];  Service: Vascular;  Laterality: Right;   FEMORAL-POPLITEAL BYPASS GRAFT Right 05/17/2017   Procedure: RIGHT FEMORAL-TIBIAL PERONEAL TRUNK ARTERY BYPASS GRAFT USING 20mmX80cm PROPATEN GRAFT WITH REMOVABLE RING;  Surgeon: Rosetta Posner, MD;  Location: [facility];  Service: Vascular;  Laterality: Right;   FRACTURE SURGERY     ILIAC ARTERY STENT  07/2006   external iliac and Rt SFA stent 07/2006 AND THE LEFT WAS IN 2006   KNEE ARTHROSCOPY WITH MEDIAL MENISECTOMY Left 03/27/2014   Procedure: left knee arthorscopy with medial chondraplasty of the medial femoral and patella, medial microfracture technique of medial femoral condyl;  Surgeon: Tobi Bastos, MD;  Location: [facility];  Service: Orthopedics;  Laterality: Left;   LEFT HEART CATHETERIZATION WITH CORONARY ANGIOGRAM Right 04/27/2013   Procedure: LEFT HEART CATHETERIZATION WITH CORONARY ANGIOGRAM;  Surgeon: Leonie Man, MD;  Location: [facility];  Service: Cardiovascular;  Laterality: Right;   LOWER EXTREMITY ANGIOGRAM Right 01/31/2016   Procedure: INTRAOP RIGHT LOWER EXTREMITY ANGIOGRAM;  Surgeon: Mal Misty, MD;  Location: [facility];  Service: Vascular;  Laterality: Right;   ORIF WRIST FRACTURE Right 1983   OVARIAN CYST REMOVAL Right    PATCH ANGIOPLASTY Right 01/31/2016   Procedure: RIGHT COMMON FEMORAL AND PROFUNDA FEMORIS ENDARECTOMY WITH PATCH ANGIOPLASTY;  Surgeon: Mal Misty, MD;  Location: [place];  Service: Vascular;  Laterality: Right;   PATCH ANGIOPLASTY Right 01/09/2017   Procedure: PATCH ANGIOPLASTY RIGHT POPLITEAL ARTERY BYPASS GRAFT;  Surgeon: [name], MD;  Location: [place];  Service: Vascular;  Laterality: Right;    PERIPHERAL VASCULAR CATHETERIZATION N/A 06/27/2015   Procedure: Lower Extremity Angiography;  Surgeon: Lorretta Harp, MD;  Location: [facility];  Service: Cardiovascular;  Laterality: N/A;   PERIPHERAL VASCULAR CATHETERIZATION Bilateral 01/29/2016   Procedure: Lower Extremity Angiography;  Surgeon: Wellington Hampshire, MD;  Location: [facility];  Service: Cardiovascular;  Laterality: Bilateral;   PERIPHERAL VASCULAR CATHETERIZATION N/A 01/29/2016   Procedure: Abdominal Aortogram;  Surgeon: Wellington Hampshire, MD;  Location: [facility];  Service: Cardiovascular;  Laterality: N/A;   REFRACTIVE SURGERY Bilateral    "6 in one eye; 7 in the other; to relieve pressure; not glaucoma" (01/28/2016)   SFA Right 06/27/2015   overlapping [place]ahn covered stents   [place] Left 05/17/2017   Procedure: LEFT LEG GREATER SAPHENOUS VEIN HARVEST;  Surgeon: Rosetta Posner, MD;  Location: [facility];  Service: Vascular;  Laterality: Left;   VEIN REPAIR Right 01/31/2016   Procedure: RIGHT GREATER SAPHENOUS VEIN EXAMNED BUT NOT REMOVED;  Surgeon: Mal Misty, MD;  Location: [facility];  Service: Vascular;  Laterality: Right;    Family History  Problem Relation  Age of Onset   CAD Mother    Lung cancer Mother    Heart attack Mother    CAD Father    Heart disease Father    Heart attack Father 11       died in hes 47's with heart disease   Healthy Sister    Liver cancer Brother    Healthy Sister    CAD Brother        has had CABG   CAD Brother     Social History   Socioeconomic History   Marital status: Widowed    Spouse name: Not on file   Number of children: 2   Years of education: 68   Highest education level: Not on file  Occupational History   Occupation: CNA  Tobacco Use   Smoking status: Former    Packs/day: 1.00    Years: 45.00    Pack years: 45.00    Types: Cigarettes    Quit date: 11/11/2012    Years since quitting: 8.8   Smokeless tobacco: Never    Tobacco comments:    quit 4 years ago  Vaping Use   Vaping Use: Never used  Substance and Sexual Activity   Alcohol use: No    Alcohol/week: 0.0 standard drinks   Drug use: No   Sexual activity: Not Currently    Birth control/protection: None  Other Topics Concern   Not on file  Social History Narrative   Not on file   Social Determinants of Health   Financial Resource Strain: Not on file  Food Insecurity: Not on file  Transportation Needs: Not on file  Physical Activity: Not on file  Stress: Not on file  Social Connections: Not on file  Intimate Partner Violence: Not on file    Outpatient Medications Prior to Visit  Medication Sig Dispense Refill   acetaminophen (TYLENOL) 500 MG tablet Take 2 tablets (1,000 mg total) by mouth every 6 (six) hours. 30 tablet 0   alendronate (FOSAMAX) 70 MG tablet TAKE 1 TABLET ONCE A WEEK 4 tablet 2   ARTIFICIAL TEAR OP Place 1 drop into both eyes as needed (for dry eyes).     aspirin EC 325 MG tablet Take 325 mg by mouth daily.     calcium-vitamin D (OSCAL WITH D) 500-200 MG-UNIT tablet Take 1 tablet by mouth daily with breakfast. 90 tablet 1   chlorthalidone (HYGROTON) 25 MG tablet Take 1 tablet (25 mg total) by mouth daily. 30 tablet 5   diclofenac Sodium (VOLTAREN) 1 % GEL Apply 2 g topically 4 (four) times daily. 50 g 0   diltiazem (CARDIZEM CD) 240 MG 24 hr capsule TAKE 1 CAPSULE EVERY MORNING 90 capsule 0   ELIQUIS 5 MG TABS tablet TAKE ONE TABLET TWICE DAILY 60 tablet 0   escitalopram (LEXAPRO) 20 MG tablet TAKE ONE TABLET ONCE DAILY 30 tablet 0   ezetimibe (ZETIA) 10 MG tablet TAKE ONE TABLET ONCE DAILY 30 tablet 0   fenofibrate (TRICOR) 145 MG tablet Take 1 tablet (145 mg total) by mouth daily. (NEEDS TO BE SEEN BEFORE NEXT REFILL) 30 tablet 0   fenofibrate (TRICOR) 145 MG tablet TAKE ONE TABLET ONCE DAILY 30 tablet 0   flecainide (TAMBOCOR) 50 MG tablet TAKE 1 TABLET 2 TIMES A DAY 60 tablet 2   hydroxyurea (HYDREA) 500 MG capsule  Take 1 capsule (500 mg total) by mouth daily. Take one capsule daily except Sundays. May take with food to minimize GI side effects.  30 capsule 3   lisinopril (ZESTRIL) 20 MG tablet Take by mouth.     mupirocin ointment (BACTROBAN) 2 % APPLY TO AFFECTED AREA 3)TIMES A DAY FOR 1 WEEK     omeprazole (PRILOSEC) 20 MG capsule TAKE 1 CAPSULE 2 TIMES A DAY BEFORE A MEAL 180 capsule 0   traMADol (ULTRAM) 50 MG tablet Take 1 tablet (50 mg total) by mouth 2 (two) times daily. 60 tablet 0   triamcinolone cream (KENALOG) 0.1 % Apply 1 g topically 1 day or 1 dose.     valsartan (DIOVAN) 160 MG tablet TAKE ONE TABLET ONCE DAILY 60 tablet 4   aspirin 325 MG EC tablet Take by mouth.     diltiazem (TIAZAC) 240 MG 24 hr capsule Take by mouth.     fenofibrate (TRICOR) 145 MG tablet Take by mouth.     hydroxyurea (HYDREA) 500 MG capsule Take by mouth.     traMADol (ULTRAM) 50 MG tablet Take 1 tablet (50 mg total) by mouth every 8 (eight) hours as needed. 60 tablet 2   No facility-administered medications prior to visit.    Allergies  Allergen Reactions   Atenolol Rash and Itching   Demerol  [Meperidine Hcl] Itching   Demerol [Meperidine] Other (See Comments) and Itching    Unknown, can't remember  Unknown, can't remember    Gabapentin Anxiety and Other (See Comments)    SI and HI   Inderal [Propranolol] Other (See Comments) and Itching    Doesn't remember    Prednisone Other (See Comments)    Muscle spasms   Statins Itching and Other (See Comments)    Simvastatin-caused severe itching Pravastatin-caused lesser tiching One type of statin? Caused stroke in 2006, and other symptoms as result   Warfarin And Related Other (See Comments)    Caused nose bleeds   Crestor [Rosuvastatin] Itching and Rash   Docosahexaenoic Acid (Dha)     Other reaction(s): Other (See Comments) nosebleeds   Lactose Intolerance (Gi) Other (See Comments)    Bloating, gas   Lipitor [Atorvastatin] Rash   Lovaza [Omega-3-Acid  Ethyl Esters] Other (See Comments)    nosebleeds    Penicillins Hives, Rash and Other (See Comments)    Has patient had a PCN reaction causing immediate rash, facial/tongue/throat swelling, SOB or lightheadedness with hypotension: Yes Has patient had a PCN reaction causing severe rash involving mucus membranes or skin necrosis: No Has patient had a PCN reaction that required hospitalization: No Has patient had a PCN reaction occurring within the last 10 years: No If all of the above answers are "NO", then may proceed with Cephalosporin use.  Questionable high fever    Pravastatin Itching and Rash   Simvastatin Itching, Rash and Other (See Comments)   Trilipix [Choline Fenofibrate] Other (See Comments)    Doesn't remember    Warfarin Other (See Comments)    nosebleeds   Hydralazine Swelling   Effexor [Venlafaxine Hcl] Other (See Comments)    Doesn't remember    Nexium [Esomeprazole Magnesium] Rash   Percocet [Oxycodone-Acetaminophen] Other (See Comments)    Doesn't remember    Plavix [Clopidogrel Bisulfate] Rash    ROS Review of Systems  Constitutional: Negative.   HENT: Negative.    Eyes: Negative.   Cardiovascular: Negative.   Gastrointestinal: Negative.   Genitourinary: Negative.   Musculoskeletal:  Positive for back pain and myalgias.  Skin:  Negative for rash.  All other systems reviewed and are negative.    Objective:  Physical Exam Vitals and nursing note reviewed.  Constitutional:      Appearance: Normal appearance.  HENT:     Head: Normocephalic.  Eyes:     Conjunctiva/sclera: Conjunctivae normal.  Cardiovascular:     Rate and Rhythm: Normal rate and regular rhythm.     Pulses: Normal pulses.     Heart sounds: Normal heart sounds.  Pulmonary:     Effort: Pulmonary effort is normal.     Breath sounds: Normal breath sounds.  Abdominal:     General: Bowel sounds are normal.  Musculoskeletal:        General: Tenderness present.     Right elbow:  Swelling present. Tenderness present.     Left elbow: Swelling present. Tenderness present.  Skin:    Findings: Bruising present. No rash.  Neurological:     Mental Status: She is alert and oriented to person, place, and time.  Psychiatric:        Behavior: Behavior normal.    BP (!) 173/54   Pulse 74   Temp 98.4 F (36.9 C) (Temporal)   Ht 5\' 4"  (1.626 m)   Wt 137 lb (62.1 kg)   SpO2 96%   BMI 23.52 kg/m  Wt Readings from Last 3 Encounters:  09/09/21 137 lb (62.1 kg)  09/03/21 138 lb (62.6 kg)  07/17/21 133 lb 1.6 oz (60.4 kg)     Health Maintenance Due  Topic Date Due   Hepatitis C Screening  Never done    There are no preventive care reminders to display for this patient.  Lab Results  Component Value Date   TSH 1.550 06/28/2020   Lab Results  Component Value Date   WBC 21.1 (H) 08/18/2021   HGB 12.7 08/18/2021   HCT 44.2 08/18/2021   MCV 77.3 (L) 08/18/2021   PLT 507 (H) 08/18/2021   Lab Results  Component Value Date   NA 136 11/24/2019   K 4.8 11/24/2019   CO2 18 (L) 11/24/2019   GLUCOSE 89 11/24/2019   BUN 21 11/24/2019   CREATININE 0.93 11/24/2019   BILITOT 0.2 08/15/2019   ALKPHOS 62 08/15/2019   AST 18 08/15/2019   ALT 20 08/15/2019   PROT 6.8 08/15/2019   ALBUMIN 4.1 08/15/2019   CALCIUM 9.0 11/24/2019   ANIONGAP 10 09/12/2018   Lab Results  Component Value Date   CHOL 171 06/04/2021   Lab Results  Component Value Date   HDL 33 (L) 06/04/2021   Lab Results  Component Value Date   LDLCALC 88 06/04/2021   Lab Results  Component Value Date   TRIG 298 (H) 06/04/2021   Lab Results  Component Value Date   CHOLHDL 5.2 (H) 06/04/2021   Lab Results  Component Value Date   HGBA1C 5.7 (H) 01/28/2016      Assessment & Plan:   Problem List Items Addressed This Visit       Cardiovascular and Mediastinum   Essential hypertension    Uncontrolled hypertension. Patient is managed by cardiologist and have an upcoming appointment.  Continue healthy low sodium diet. And follow up in 3 months.         Musculoskeletal and Integument   DDD (degenerative disc disease), lumbar    Pain management on current medication no changes necessary.        Other   Hyperlipidemia - Primary    Completed lipid panel, results pending. Will treat patient based on lab results, continue low cholesterol diet.  Follow-up in 3 months.  Relevant Orders   Lipid Panel    No orders of the defined types were placed in this encounter.   Follow-up: Return in about 3 months (around 12/09/2021).    Ivy Lynn, NP

## 2021-09-09 NOTE — Assessment & Plan Note (Signed)
>>  ASSESSMENT AND PLAN FOR MIXED HYPERLIPIDEMIA WRITTEN ON 09/09/2021  5:09 PM BY CHERYLENE HOMER HERO, NP  Completed lipid panel, results pending. Will treat patient based on lab results, continue low cholesterol diet.  Follow-up in 3 months.

## 2021-09-10 ENCOUNTER — Other Ambulatory Visit: Payer: Self-pay | Admitting: Nurse Practitioner

## 2021-09-10 ENCOUNTER — Emergency Department (HOSPITAL_COMMUNITY): Payer: 59

## 2021-09-10 ENCOUNTER — Emergency Department (HOSPITAL_COMMUNITY)
Admission: EM | Admit: 2021-09-10 | Discharge: 2021-09-10 | Disposition: A | Discharge status: Home / Self Care | Payer: 59 | Attending: Emergency Medicine | Admitting: Emergency Medicine

## 2021-09-10 ENCOUNTER — Encounter (HOSPITAL_COMMUNITY): Payer: Self-pay | Admitting: Emergency Medicine

## 2021-09-10 ENCOUNTER — Other Ambulatory Visit: Payer: Self-pay

## 2021-09-10 DIAGNOSIS — Z87891 Personal history of nicotine dependence: Secondary | ICD-10-CM | POA: Diagnosis not present

## 2021-09-10 DIAGNOSIS — Z7901 Long term (current) use of anticoagulants: Secondary | ICD-10-CM | POA: Insufficient documentation

## 2021-09-10 DIAGNOSIS — I480 Paroxysmal atrial fibrillation: Secondary | ICD-10-CM | POA: Insufficient documentation

## 2021-09-10 DIAGNOSIS — I10 Essential (primary) hypertension: Secondary | ICD-10-CM | POA: Insufficient documentation

## 2021-09-10 DIAGNOSIS — I2510 Atherosclerotic heart disease of native coronary artery without angina pectoris: Secondary | ICD-10-CM | POA: Insufficient documentation

## 2021-09-10 DIAGNOSIS — R112 Nausea with vomiting, unspecified: Secondary | ICD-10-CM | POA: Diagnosis present

## 2021-09-10 DIAGNOSIS — G43809 Other migraine, not intractable, without status migrainosus: Secondary | ICD-10-CM | POA: Insufficient documentation

## 2021-09-10 DIAGNOSIS — Z7982 Long term (current) use of aspirin: Secondary | ICD-10-CM | POA: Diagnosis not present

## 2021-09-10 DIAGNOSIS — Z79899 Other long term (current) drug therapy: Secondary | ICD-10-CM | POA: Insufficient documentation

## 2021-09-10 LAB — COMPREHENSIVE METABOLIC PANEL
ALT: 13 U/L (ref 0–44)
AST: 22 U/L (ref 15–41)
Albumin: 3.4 g/dL — ABNORMAL LOW (ref 3.5–5.0)
Alkaline Phosphatase: 66 U/L (ref 38–126)
Anion gap: 9 (ref 5–15)
BUN: 23 mg/dL (ref 8–23)
CO2: 22 mmol/L (ref 22–32)
Calcium: 8.8 mg/dL — ABNORMAL LOW (ref 8.9–10.3)
Chloride: 104 mmol/L (ref 98–111)
Creatinine, Ser: 0.81 mg/dL (ref 0.44–1.00)
GFR, Estimated: >60 mL/min (ref >60)
Glucose, Bld: 136 mg/dL — ABNORMAL HIGH (ref 70–99)
Potassium: 4.8 mmol/L (ref 3.5–5.1)
Sodium: 135 mmol/L (ref 135–145)
Total Bilirubin: 0.3 mg/dL (ref 0.3–1.2)
Total Protein: 6.8 g/dL (ref 6.5–8.1)

## 2021-09-10 LAB — CBC WITH DIFFERENTIAL/PLATELET
Abs Immature Granulocytes: 0.28 10*3/uL — ABNORMAL HIGH (ref 0.00–0.07)
Basophils Absolute: 0.2 10*3/uL — ABNORMAL HIGH (ref 0.0–0.1)
Basophils Relative: 1 %
Eosinophils Absolute: 0.1 10*3/uL (ref 0.0–0.5)
Eosinophils Relative: 1 %
HCT: 46.9 % — ABNORMAL HIGH (ref 36.0–46.0)
Hemoglobin: 13.2 g/dL (ref 12.0–15.0)
Immature Granulocytes: 2 %
Lymphocytes Relative: 3 %
Lymphs Abs: 0.4 10*3/uL — ABNORMAL LOW (ref 0.7–4.0)
MCH: 22.5 pg — ABNORMAL LOW (ref 26.0–34.0)
MCHC: 28.1 g/dL — ABNORMAL LOW (ref 30.0–36.0)
MCV: 79.9 fL — ABNORMAL LOW (ref 80.0–100.0)
Monocytes Absolute: 0.2 10*3/uL (ref 0.1–1.0)
Monocytes Relative: 1 %
Neutro Abs: 15.9 10*3/uL — ABNORMAL HIGH (ref 1.7–7.7)
Neutrophils Relative %: 92 %
Platelets: 384 10*3/uL (ref 150–400)
RBC: 5.87 MIL/uL — ABNORMAL HIGH (ref 3.87–5.11)
RDW: 23 % — ABNORMAL HIGH (ref 11.5–15.5)
WBC: 17.1 10*3/uL — ABNORMAL HIGH (ref 4.0–10.5)
nRBC: 0 % (ref 0.0–0.2)

## 2021-09-10 LAB — LIPID PANEL
Chol/HDL Ratio: 5.1 ratio — ABNORMAL HIGH (ref 0.0–4.4)
Cholesterol, Total: 172 mg/dL (ref 100–199)
HDL: 34 mg/dL — ABNORMAL LOW (ref >39)
LDL Chol Calc (NIH): 88 mg/dL (ref 0–99)
Triglycerides: 299 mg/dL — ABNORMAL HIGH (ref 0–149)
VLDL Cholesterol Cal: 50 mg/dL — ABNORMAL HIGH (ref 5–40)

## 2021-09-10 IMAGING — CT CT HEAD W/O CM
3 of 4 series · 16 of 47 positions shown, 19 images · non-contrast
Comparison: [DATE]

CLINICAL DATA: Headache.  Evaluate for intracranial hemorrhage.

EXAM:
CT HEAD WITHOUT CONTRAST
TECHNIQUE: Contiguous axial images were obtained from the base of the skull
through the vertex without intravenous contrast.

[Series 3: head w o · axial · 0.41mm/px · z∈[+47,+177]mm · 10 of 31 slices shown, 13 images]
[im 3/31  brain]
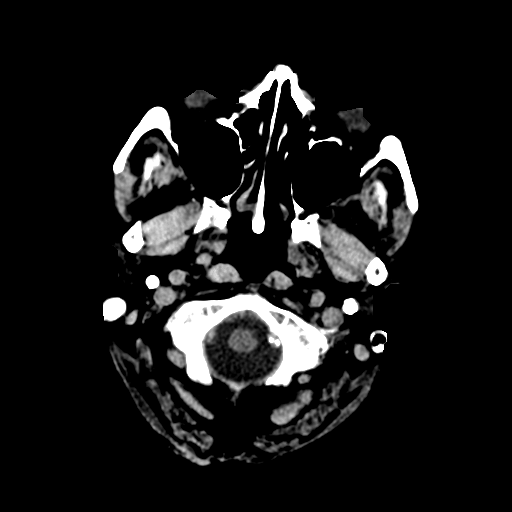
[im 3/31  bone]
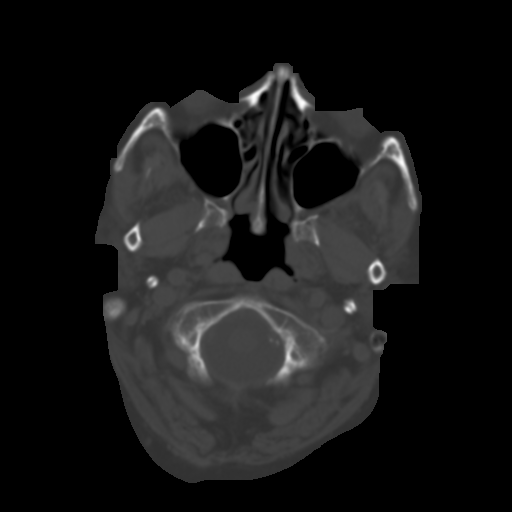
[im 6/31  brain]
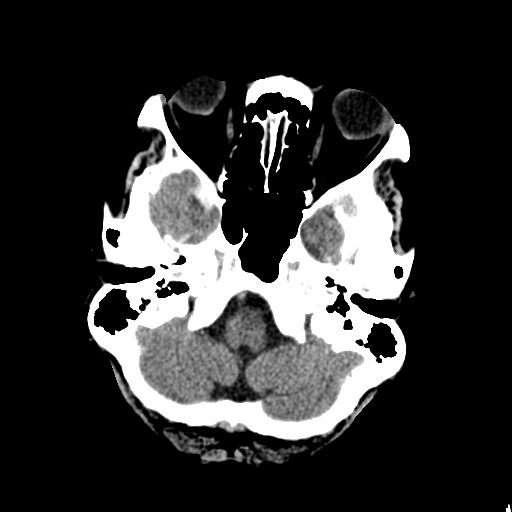
[im 9/31  brain]
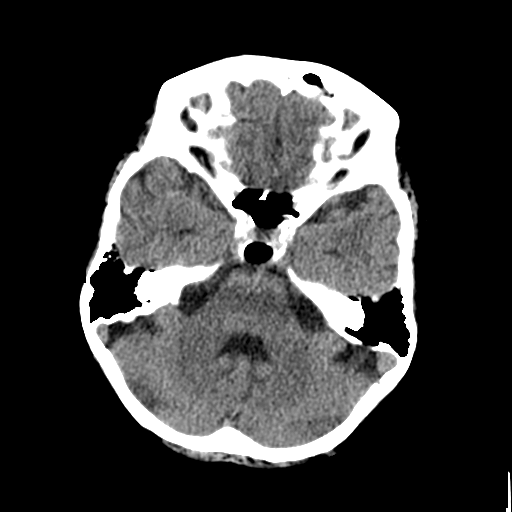
[im 12/31  brain]
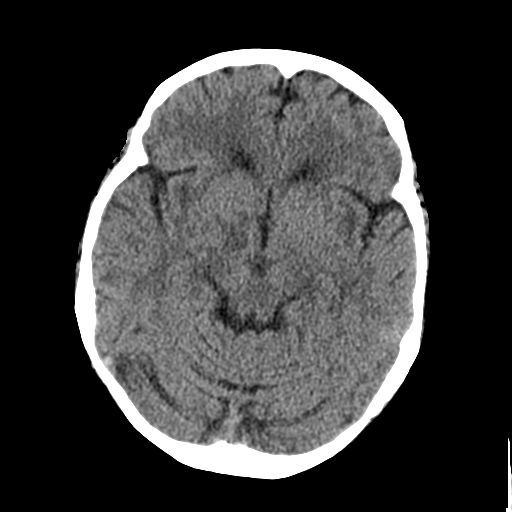
[im 15/31  brain]
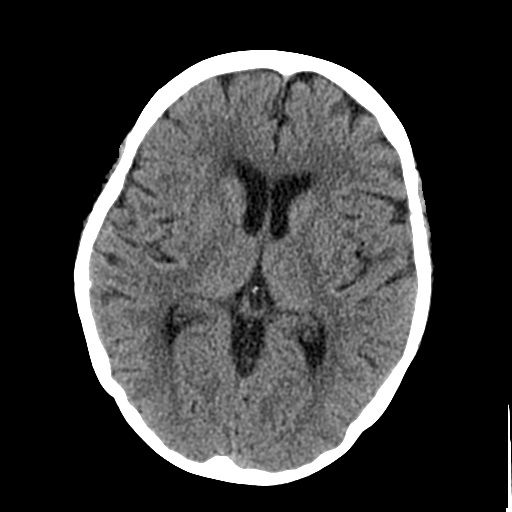
[im 15/31  bone]
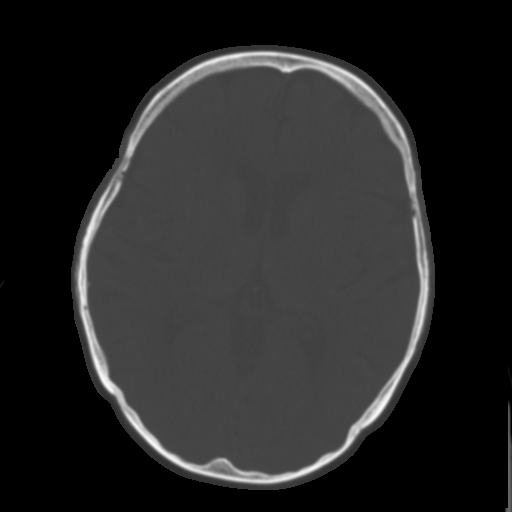
[im 18/31  brain]
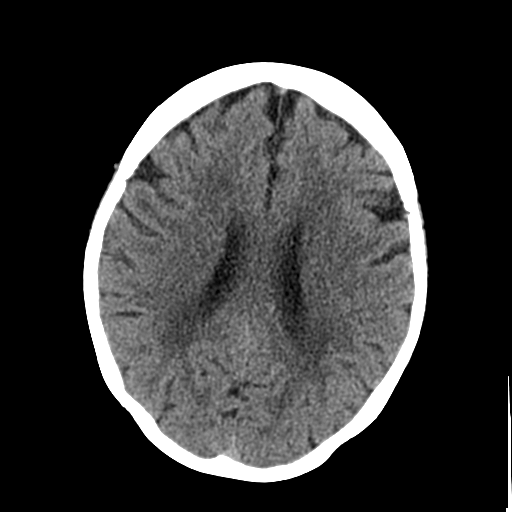
[im 21/31  brain]
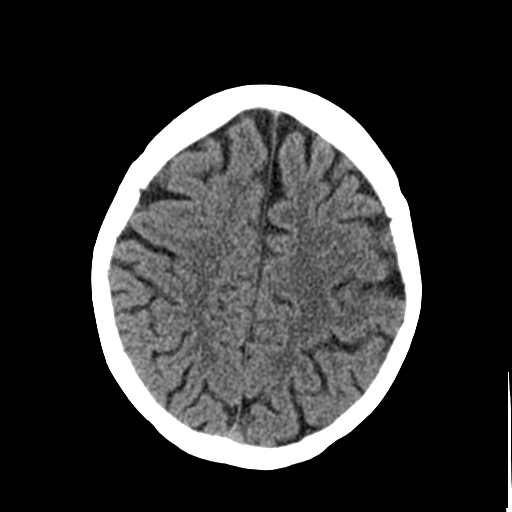
[im 23/31  brain]
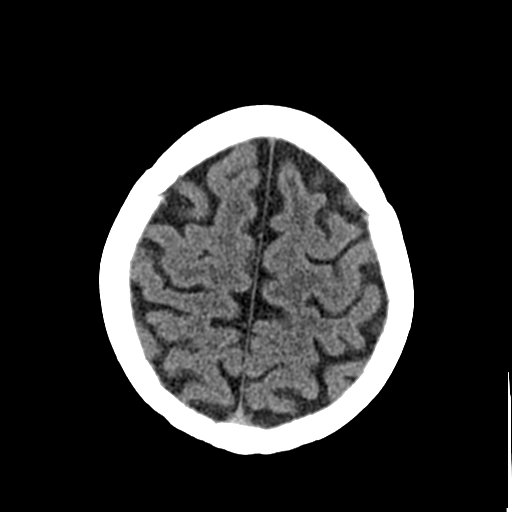
[im 26/31  brain]
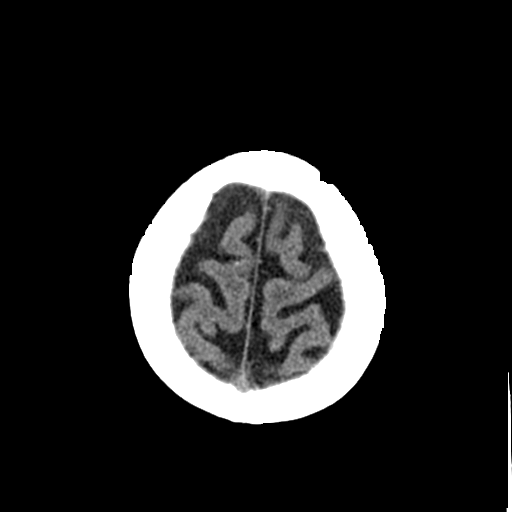
[im 26/31  bone]
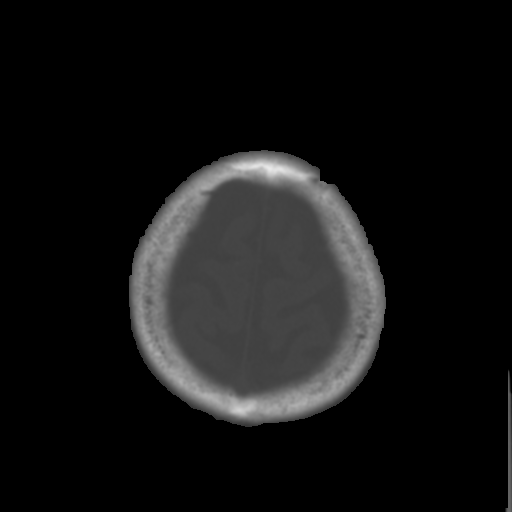
[im 29/31  brain]
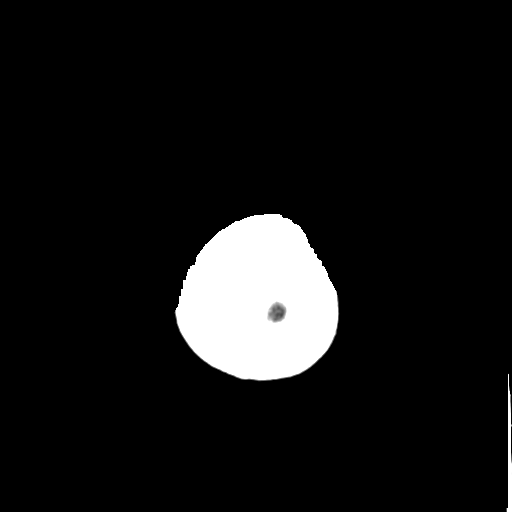

[Series 4: coronal soft · coronal · 0.31mm/px · 3 of 72 slices shown]
[im 24/72  brain]
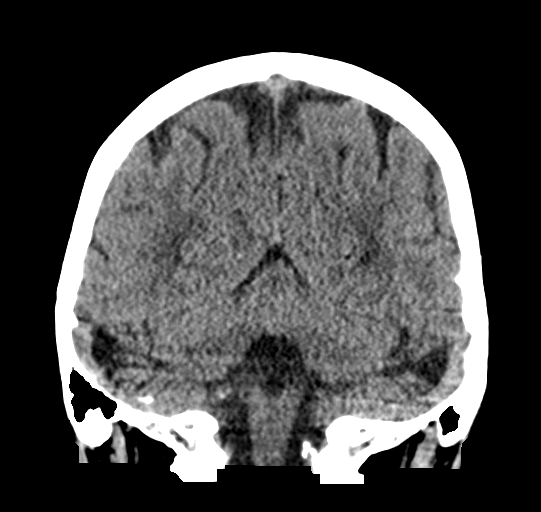
[im 32/72  brain]
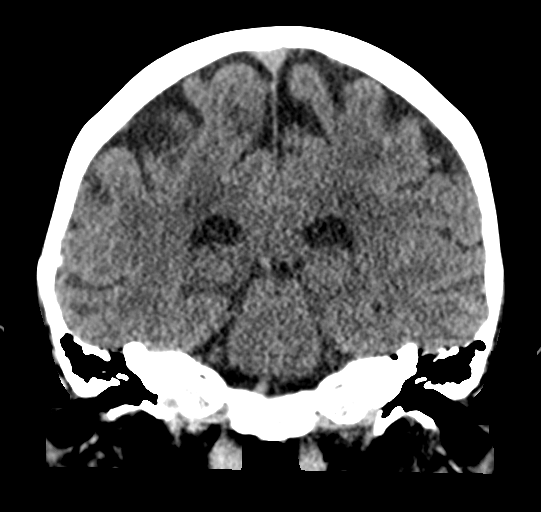
[im 40/72  brain]
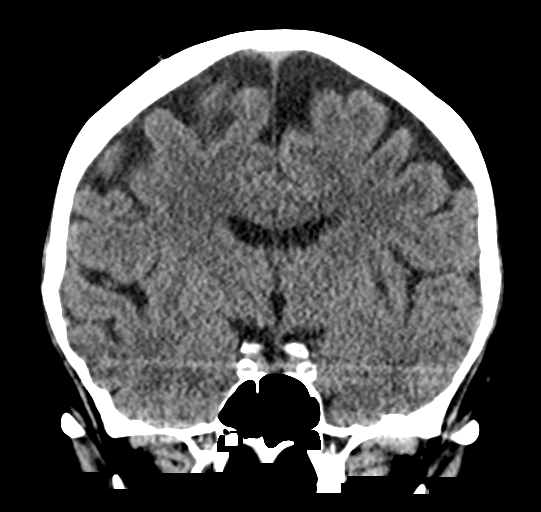

[Series 5: sagittal soft · sagittal · 0.35mm/px · 3 of 55 slices shown]
[im 19/55  brain]
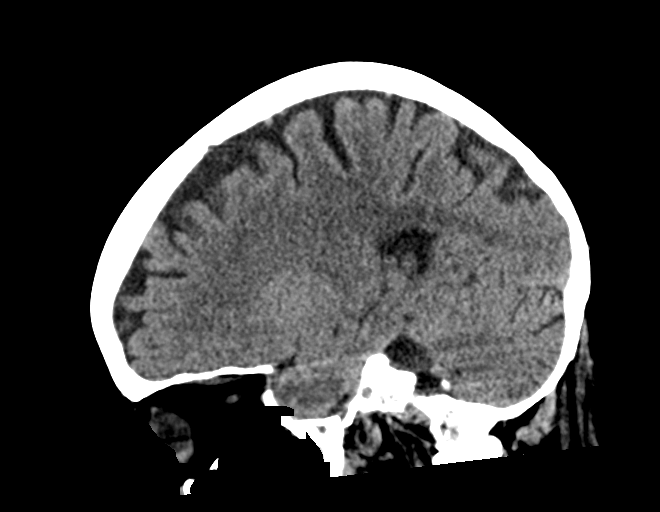
[im 28/55  brain]
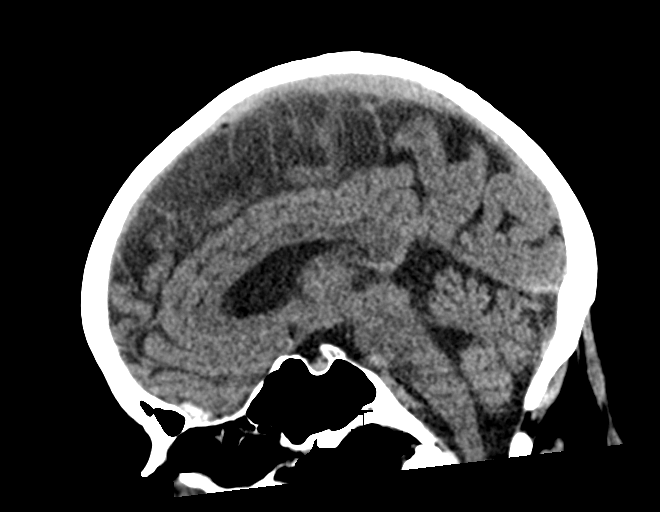
[im 37/55  brain]
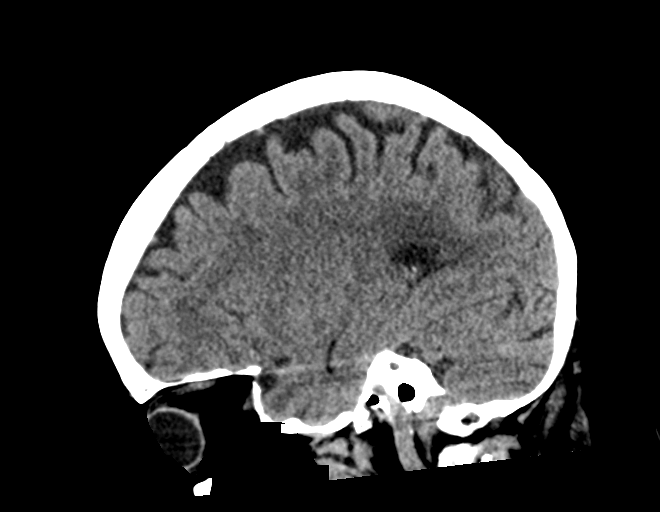

[16 of 47 positions shown; findings below may reference images not displayed]

FINDINGS: Brain: No evidence of acute infarction, hemorrhage, hydrocephalus,
extra-axial collection or mass lesion/mass effect. There is mild
diffuse low-attenuation within the subcortical and periventricular
white matter compatible with chronic microvascular disease.

Vascular: No hyperdense vessel or unexpected calcification.

Skull: Normal. Negative for fracture or focal lesion.

Sinuses/Orbits: No acute finding.

Other: None.
IMPRESSION: 1. No acute intracranial abnormalities.
2. Chronic small vessel ischemic change and brain atrophy.

## 2021-09-10 MED ORDER — SODIUM CHLORIDE 0.9 % IV BOLUS
500.0000 mL | Freq: Once | INTRAVENOUS | Status: AC
  Administered 2021-09-10: 500 mL via INTRAVENOUS

## 2021-09-10 MED ORDER — PROCHLORPERAZINE EDISYLATE 10 MG/2ML IJ SOLN
10.0000 mg | Freq: Once | INTRAMUSCULAR | Status: AC
  Administered 2021-09-10: 10 mg via INTRAVENOUS
  Filled 2021-09-10: qty 2

## 2021-09-10 MED ORDER — DIPHENHYDRAMINE HCL 50 MG/ML IJ SOLN
12.5000 mg | Freq: Once | INTRAMUSCULAR | Status: AC
  Administered 2021-09-10: 12.5 mg via INTRAVENOUS
  Filled 2021-09-10: qty 1

## 2021-09-10 MED ORDER — KETOROLAC TROMETHAMINE 30 MG/ML IJ SOLN
15.0000 mg | Freq: Once | INTRAMUSCULAR | Status: AC
  Administered 2021-09-10: 15 mg via INTRAVENOUS
  Filled 2021-09-10: qty 1

## 2021-09-10 MED ORDER — HYDROCODONE-ACETAMINOPHEN 5-325 MG PO TABS
1.0000 | ORAL_TABLET | Freq: Once | ORAL | Status: AC
  Administered 2021-09-10: 1 via ORAL
  Filled 2021-09-10: qty 1
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
## 2021-09-10 NOTE — Discharge Instructions (Signed)
Your blood pressure improved after we treated your migraine.  Actually getting plenty of rest and taking her usual medications.  Call your doctor for follow-up appointment if not better in a day or 2.

## 2021-09-10 NOTE — ED Triage Notes (Signed)
Pt c/o headache, n/v since 5 am this morning.

## 2021-09-15 ENCOUNTER — Other Ambulatory Visit: Payer: Self-pay | Admitting: Nurse Practitioner

## 2021-09-15 DIAGNOSIS — M1712 Unilateral primary osteoarthritis, left knee: Secondary | ICD-10-CM

## 2021-09-15 DIAGNOSIS — E782 Mixed hyperlipidemia: Secondary | ICD-10-CM

## 2021-09-15 DIAGNOSIS — Z8673 Personal history of transient ischemic attack (TIA), and cerebral infarction without residual deficits: Secondary | ICD-10-CM

## 2021-09-15 DIAGNOSIS — I214 Non-ST elevation (NSTEMI) myocardial infarction: Secondary | ICD-10-CM

## 2021-09-17 ENCOUNTER — Ambulatory Visit: Payer: 59 | Admitting: Podiatry

## 2021-09-18 ENCOUNTER — Inpatient Hospital Stay: Payer: 59

## 2021-09-18 ENCOUNTER — Inpatient Hospital Stay: Payer: 59 | Attending: Hematology

## 2021-09-21 ENCOUNTER — Other Ambulatory Visit: Payer: Self-pay | Admitting: Nurse Practitioner

## 2021-10-01 ENCOUNTER — Other Ambulatory Visit: Payer: Self-pay | Admitting: Nurse Practitioner

## 2021-10-01 DIAGNOSIS — I482 Chronic atrial fibrillation, unspecified: Secondary | ICD-10-CM

## 2021-10-03 ENCOUNTER — Encounter: Payer: Self-pay | Admitting: *Deleted

## 2021-10-05 ENCOUNTER — Emergency Department (HOSPITAL_COMMUNITY): Payer: 59

## 2021-10-05 ENCOUNTER — Encounter (HOSPITAL_COMMUNITY): Payer: Self-pay | Admitting: *Deleted

## 2021-10-05 ENCOUNTER — Observation Stay (HOSPITAL_COMMUNITY)
Admission: EM | Admit: 2021-10-05 | Discharge: 2021-10-08 | Disposition: A | Discharge status: Home / Self Care | Payer: 59 | Attending: Internal Medicine | Admitting: Internal Medicine

## 2021-10-05 ENCOUNTER — Other Ambulatory Visit: Payer: Self-pay

## 2021-10-05 DIAGNOSIS — Z7982 Long term (current) use of aspirin: Secondary | ICD-10-CM | POA: Insufficient documentation

## 2021-10-05 DIAGNOSIS — I2510 Atherosclerotic heart disease of native coronary artery without angina pectoris: Secondary | ICD-10-CM | POA: Diagnosis not present

## 2021-10-05 DIAGNOSIS — I480 Paroxysmal atrial fibrillation: Secondary | ICD-10-CM | POA: Diagnosis not present

## 2021-10-05 DIAGNOSIS — I639 Cerebral infarction, unspecified: Secondary | ICD-10-CM | POA: Diagnosis not present

## 2021-10-05 DIAGNOSIS — I4891 Unspecified atrial fibrillation: Secondary | ICD-10-CM | POA: Diagnosis present

## 2021-10-05 DIAGNOSIS — R4781 Slurred speech: Secondary | ICD-10-CM | POA: Diagnosis not present

## 2021-10-05 DIAGNOSIS — N179 Acute kidney failure, unspecified: Secondary | ICD-10-CM

## 2021-10-05 DIAGNOSIS — I6523 Occlusion and stenosis of bilateral carotid arteries: Secondary | ICD-10-CM

## 2021-10-05 DIAGNOSIS — F339 Major depressive disorder, recurrent, unspecified: Secondary | ICD-10-CM | POA: Diagnosis present

## 2021-10-05 DIAGNOSIS — Z20822 Contact with and (suspected) exposure to covid-19: Secondary | ICD-10-CM | POA: Insufficient documentation

## 2021-10-05 DIAGNOSIS — Z87891 Personal history of nicotine dependence: Secondary | ICD-10-CM | POA: Diagnosis not present

## 2021-10-05 DIAGNOSIS — E782 Mixed hyperlipidemia: Secondary | ICD-10-CM

## 2021-10-05 DIAGNOSIS — Z7901 Long term (current) use of anticoagulants: Secondary | ICD-10-CM | POA: Insufficient documentation

## 2021-10-05 DIAGNOSIS — K219 Gastro-esophageal reflux disease without esophagitis: Secondary | ICD-10-CM | POA: Diagnosis present

## 2021-10-05 DIAGNOSIS — R200 Anesthesia of skin: Secondary | ICD-10-CM | POA: Diagnosis present

## 2021-10-05 DIAGNOSIS — Z79899 Other long term (current) drug therapy: Secondary | ICD-10-CM | POA: Insufficient documentation

## 2021-10-05 DIAGNOSIS — I10 Essential (primary) hypertension: Secondary | ICD-10-CM | POA: Diagnosis not present

## 2021-10-05 DIAGNOSIS — M1712 Unilateral primary osteoarthritis, left knee: Secondary | ICD-10-CM

## 2021-10-05 DIAGNOSIS — G459 Transient cerebral ischemic attack, unspecified: Secondary | ICD-10-CM | POA: Diagnosis present

## 2021-10-05 LAB — RAPID URINE DRUG SCREEN, HOSP PERFORMED
Amphetamines: NOT DETECTED
Barbiturates: NOT DETECTED
Benzodiazepines: NOT DETECTED
Cocaine: NOT DETECTED
Opiates: NOT DETECTED
Tetrahydrocannabinol: NOT DETECTED

## 2021-10-05 LAB — RESP PANEL BY RT-PCR (FLU A&B, COVID) ARPGX2
Influenza A by PCR: NEGATIVE
Influenza B by PCR: NEGATIVE
SARS Coronavirus 2 by RT PCR: NEGATIVE

## 2021-10-05 LAB — URINALYSIS, ROUTINE W REFLEX MICROSCOPIC
Bilirubin Urine: NEGATIVE
Glucose, UA: 50 mg/dL — AB
Hgb urine dipstick: NEGATIVE
Ketones, ur: NEGATIVE mg/dL
Leukocytes,Ua: NEGATIVE
Nitrite: NEGATIVE
Protein, ur: >=300 mg/dL — AB
Specific Gravity, Urine: 1.009 (ref 1.005–1.030)
pH: 5 (ref 5.0–8.0)

## 2021-10-05 LAB — COMPREHENSIVE METABOLIC PANEL
ALT: 12 U/L (ref 0–44)
AST: 18 U/L (ref 15–41)
Albumin: 3.1 g/dL — ABNORMAL LOW (ref 3.5–5.0)
Alkaline Phosphatase: 56 U/L (ref 38–126)
Anion gap: 8 (ref 5–15)
BUN: 27 mg/dL — ABNORMAL HIGH (ref 8–23)
CO2: 20 mmol/L — ABNORMAL LOW (ref 22–32)
Calcium: 8 mg/dL — ABNORMAL LOW (ref 8.9–10.3)
Chloride: 104 mmol/L (ref 98–111)
Creatinine, Ser: 1.25 mg/dL — ABNORMAL HIGH (ref 0.44–1.00)
GFR, Estimated: 46 mL/min — ABNORMAL LOW (ref >60)
Glucose, Bld: 125 mg/dL — ABNORMAL HIGH (ref 70–99)
Potassium: 3.9 mmol/L (ref 3.5–5.1)
Sodium: 132 mmol/L — ABNORMAL LOW (ref 135–145)
Total Bilirubin: 0.5 mg/dL (ref 0.3–1.2)
Total Protein: 6.3 g/dL — ABNORMAL LOW (ref 6.5–8.1)

## 2021-10-05 LAB — CBG MONITORING, ED
Glucose-Capillary: 129 mg/dL — ABNORMAL HIGH (ref 70–99)
Glucose-Capillary: 142 mg/dL — ABNORMAL HIGH (ref 70–99)

## 2021-10-05 LAB — CBC
HCT: 44.9 % (ref 36.0–46.0)
Hemoglobin: 12.9 g/dL (ref 12.0–15.0)
MCH: 23 pg — ABNORMAL LOW (ref 26.0–34.0)
MCHC: 28.7 g/dL — ABNORMAL LOW (ref 30.0–36.0)
MCV: 80.2 fL (ref 80.0–100.0)
Platelets: 358 10*3/uL (ref 150–400)
RBC: 5.6 MIL/uL — ABNORMAL HIGH (ref 3.87–5.11)
RDW: 21.4 % — ABNORMAL HIGH (ref 11.5–15.5)
WBC: 18.1 10*3/uL — ABNORMAL HIGH (ref 4.0–10.5)
nRBC: 0 % (ref 0.0–0.2)

## 2021-10-05 LAB — APTT: aPTT: 53 seconds — ABNORMAL HIGH (ref 24–36)

## 2021-10-05 LAB — DIFFERENTIAL
Abs Immature Granulocytes: 0.22 10*3/uL — ABNORMAL HIGH (ref 0.00–0.07)
Basophils Absolute: 0.2 10*3/uL — ABNORMAL HIGH (ref 0.0–0.1)
Basophils Relative: 1 %
Eosinophils Absolute: 0.4 10*3/uL (ref 0.0–0.5)
Eosinophils Relative: 2 %
Immature Granulocytes: 1 %
Lymphocytes Relative: 8 %
Lymphs Abs: 1.5 10*3/uL (ref 0.7–4.0)
Monocytes Absolute: 0.5 10*3/uL (ref 0.1–1.0)
Monocytes Relative: 3 %
Neutro Abs: 15.3 10*3/uL — ABNORMAL HIGH (ref 1.7–7.7)
Neutrophils Relative %: 85 %

## 2021-10-05 LAB — PROTIME-INR
INR: 1.3 — ABNORMAL HIGH (ref 0.8–1.2)
Prothrombin Time: 15.7 seconds — ABNORMAL HIGH (ref 11.4–15.2)

## 2021-10-05 LAB — ETHANOL: Alcohol, Ethyl (B): <10 mg/dL (ref <10)

## 2021-10-05 IMAGING — CT CT HEAD W/O CM
3 series · 16 of 47 positions shown, 19 images · non-contrast
Comparison: [DATE]

CLINICAL DATA: TIA

EXAM:
CT HEAD WITHOUT CONTRAST
TECHNIQUE: Contiguous axial images were obtained from the base of the skull
through the vertex without intravenous contrast.

[Series 2: head w o · axial · 0.43mm/px · z∈[-19,+106]mm · 10 of 31 slices shown, 13 images]
[im 3/31  brain]
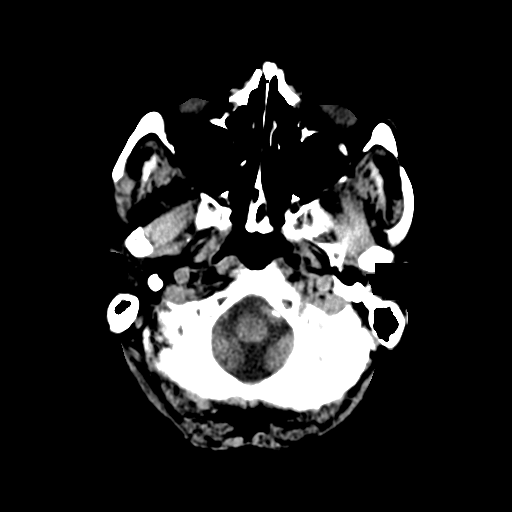
[im 3/31  bone]
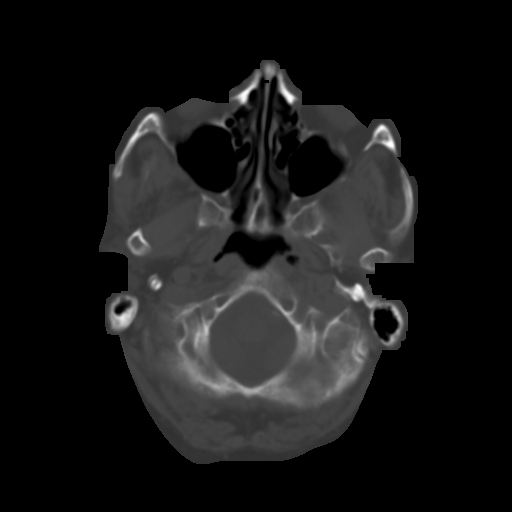
[im 6/31  brain]
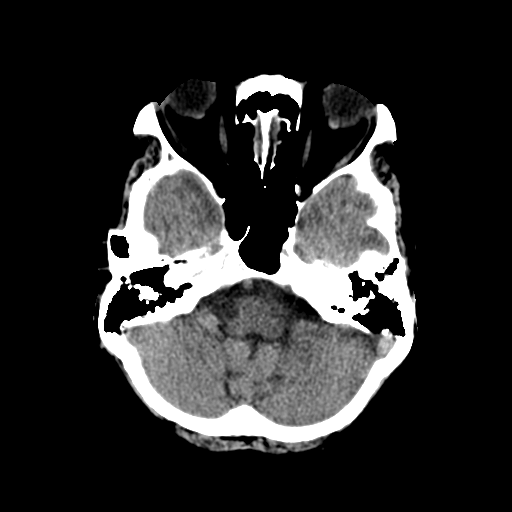
[im 9/31  brain]
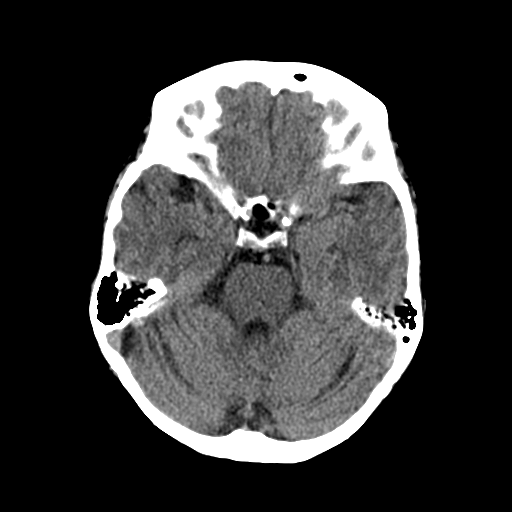
[im 11/31  brain]
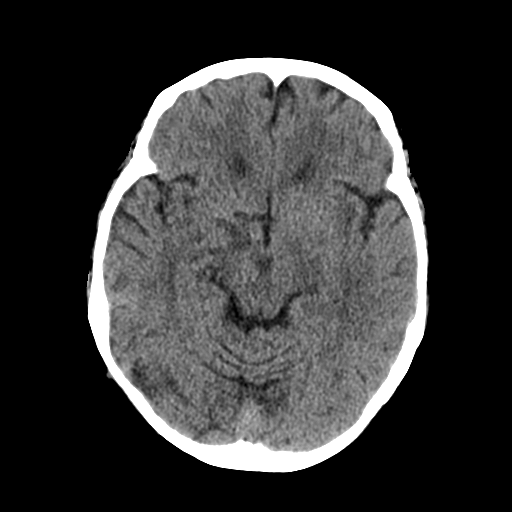
[im 14/31  brain]
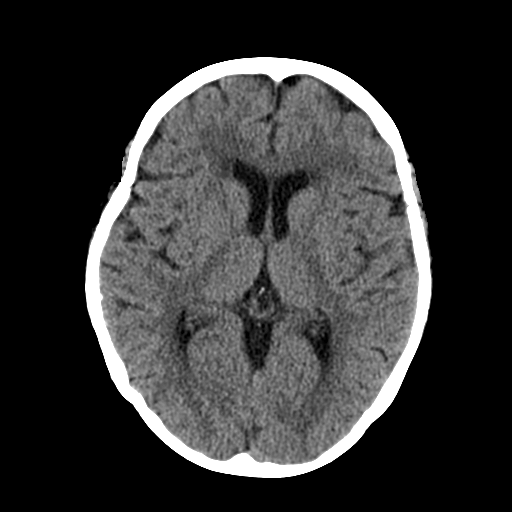
[im 14/31  bone]
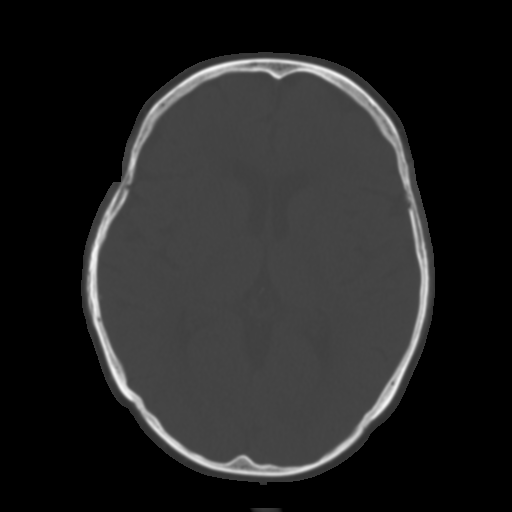
[im 17/31  brain]
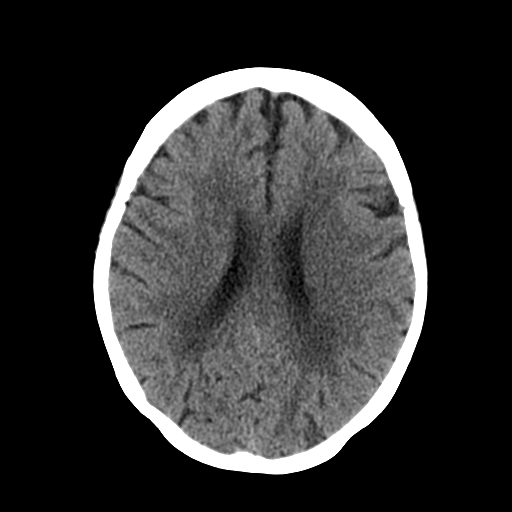
[im 20/31  brain]
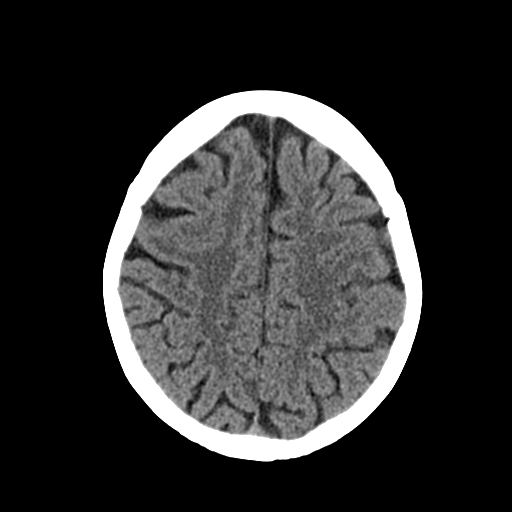
[im 23/31  brain]
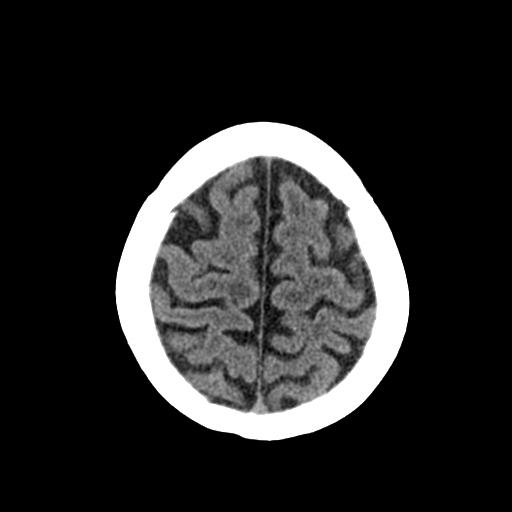
[im 25/31  brain]
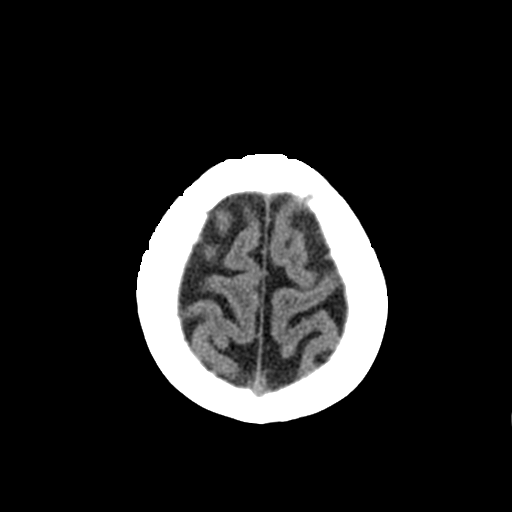
[im 25/31  bone]
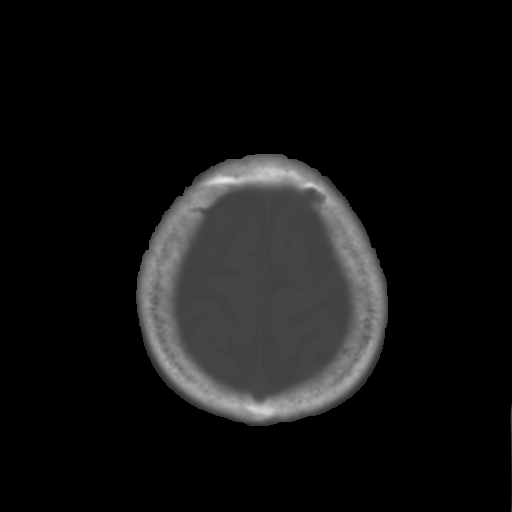
[im 28/31  brain]
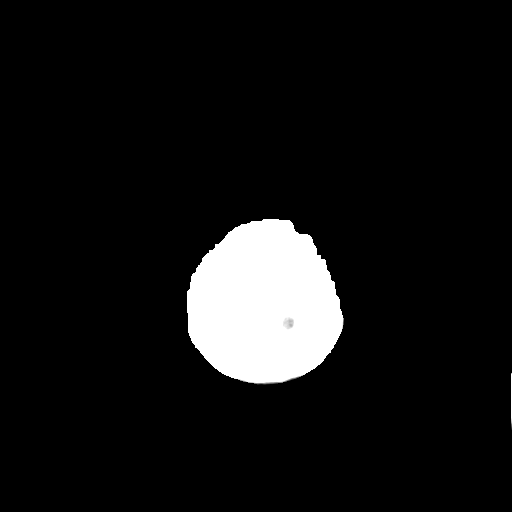

[Series 4: coronal soft · coronal · 0.37mm/px · 3 of 62 slices shown]
[im 21/62  brain]
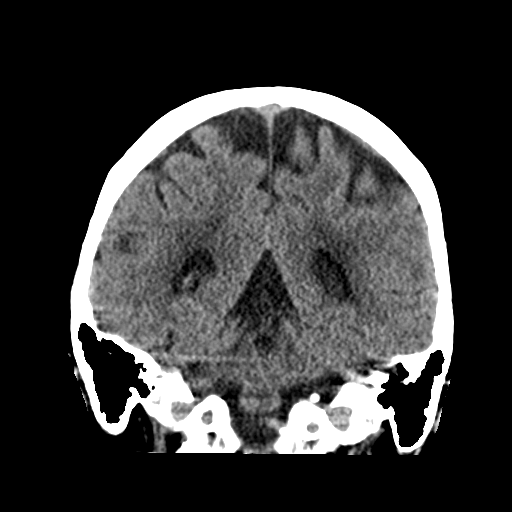
[im 28/62  brain]
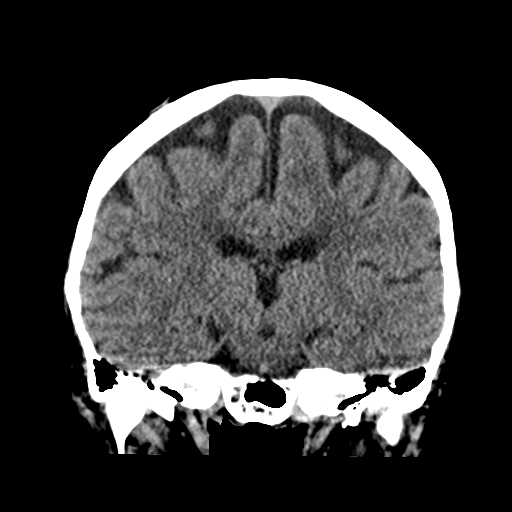
[im 34/62  brain]
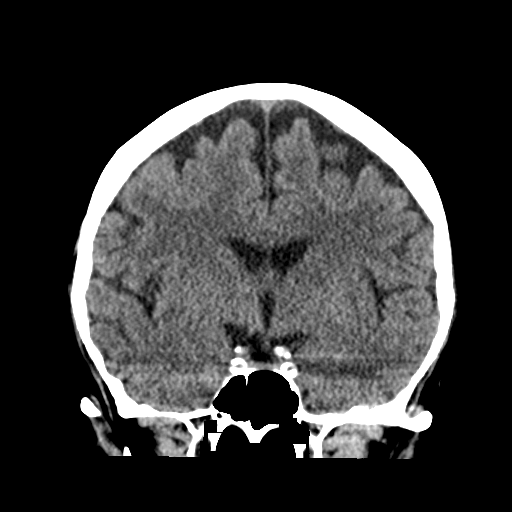

[Series 5: sagittal soft · sagittal · 0.35mm/px · 3 of 67 slices shown]
[im 23/67  brain]
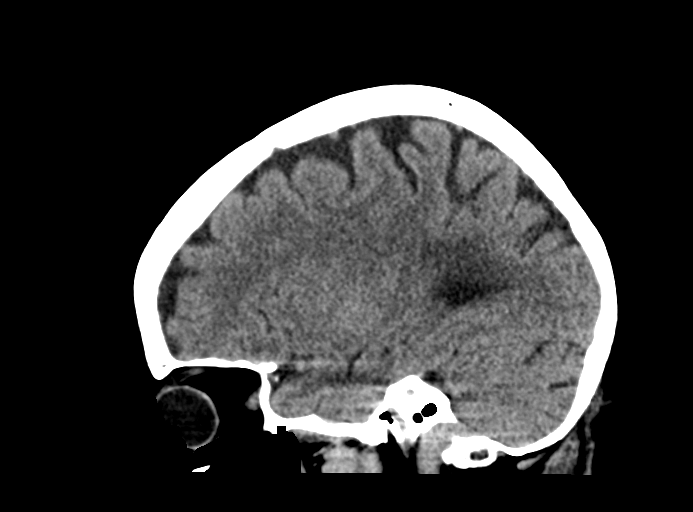
[im 34/67  brain]
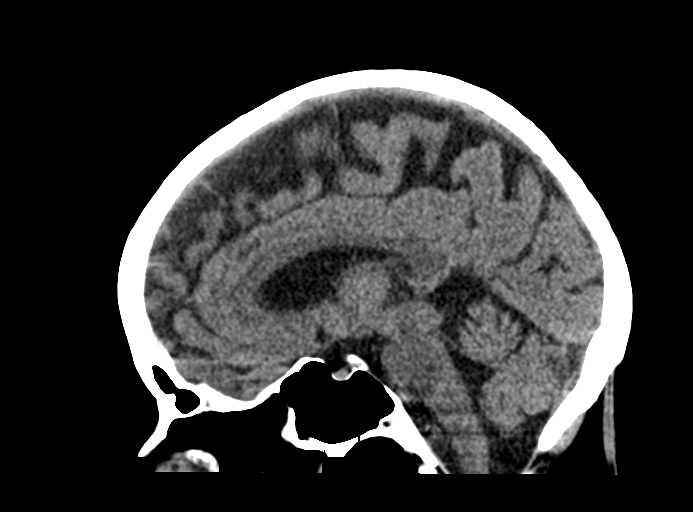
[im 45/67  brain]
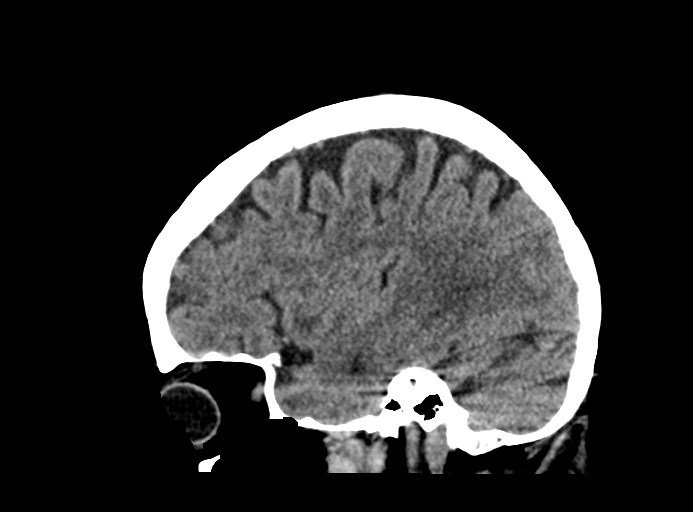

[16 of 47 positions shown; findings below may reference images not displayed]

FINDINGS: Brain: No acute intracranial abnormality. Specifically, no
hemorrhage, hydrocephalus, mass lesion, acute infarction, or
significant intracranial injury.

Vascular: No hyperdense vessel or unexpected calcification.

Skull: No acute calvarial abnormality.

Sinuses/Orbits: No acute findings

Other: None
IMPRESSION: No acute intracranial abnormality.

## 2021-10-05 MED ORDER — SODIUM CHLORIDE 0.9 % IV BOLUS
1000.0000 mL | Freq: Once | INTRAVENOUS | Status: AC
  Administered 2021-10-05: 1000 mL via INTRAVENOUS

## 2021-10-05 NOTE — ED Provider Notes (Signed)
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 the intracranial ICAs likely reflecting atherosclerotic disease. There is no high-grade stenosis or occlusion. The bilateral MCAs are patent. The left A1 segment is diminutive, likely a normal variant. There is focal moderate stenosis of the left A2 segment (10-148). The bilateral ACAs are otherwise patent. There is multifocal irregularity of the right A2 segment with up to severe stenosis (166-063). There is no aneurysm. Posterior circulation: There is minimal flow related enhancement in the proximal left V4 segment. The distal left V4 segment is patent. The right V4 segment is patent with mild multifocal irregularity and narrowing. The basilar artery is patent with mild multifocal irregularity and narrowing. There is focal stenosis at the right P1/P2 junction, though the right PCA is supplied by a prominent right posterior communicating artery. The bilateral PCAs are  otherwise patent with mild multifocal irregularity and narrowing throughout. There is no aneurysm. Anatomic variants: As above. IMPRESSION: 1. Tiny acute to subacute infarct in the right centrum semiovale. No other acute infarct or other acute intracranial pathology. 2. Mild global parenchymal volume loss and moderate chronic white matter microangiopathy. 3. Multifocal irregularity and narrowing of the right A2 segment with up to severe stenosis, and moderate focal stenosis of the left A2 segment. 4. Diminutive flow related enhancement in the proximal left V4 segment. This may reflect high-grade stenosis or occlusion due to atherosclerotic disease with possible retrograde flow via the right V4 segment. CTA head could be obtained for further evaluation. 5. Focal stenosis at the right P1/P2 junction, though the right PCA is primarily supplied by a prominent posterior communicating artery. 6. Otherwise, mild irregularity of the remainder of the intracranial vasculature as detailed above. Electronically Signed   By: Valetta Mole M.D.   On: 10/06/2021 10:17    Procedures Procedures   Medications Ordered in ED Medications  aspirin EC tablet 325 mg (has no administration in time range)  chlorthalidone (HYGROTON) tablet 25 mg (has no administration in time range)  diltiazem (CARDIZEM CD) 24 hr capsule 240 mg (has no administration in time range)  ezetimibe (ZETIA) tablet 10 mg (has no administration in time range)  fenofibrate tablet 54 mg (has no administration in time range)  flecainide (TAMBOCOR) tablet 50 mg (50 mg Oral Given 10/06/21 0223)  escitalopram (LEXAPRO) tablet 20 mg (has no administration in time range)  pantoprazole (PROTONIX) EC tablet 40 mg (has no administration in time range)  apixaban (ELIQUIS) tablet 5 mg (5 mg Oral Given 10/06/21 0223)  acetaminophen (TYLENOL) tablet 650 mg (has no administration in time range)    Or  acetaminophen (TYLENOL) 160 MG/5ML solution 650 mg (has no  administration in time range)    Or  acetaminophen (TYLENOL) suppository 650 mg (has no administration in time range)  senna-docusate (Senokot-S) tablet 1 tablet (has no administration in time range)  sodium chloride 0.9 % bolus 1,000 mL (0 mLs Intravenous Stopped 10/05/21 2354)   stroke: mapping our early stages of recovery book ( Does not apply Given 10/06/21 1102)    ED Course  I have reviewed the triage vital signs and the nursing notes.  Pertinent labs & imaging results that were available during my care of the patient were reviewed by me and considered in my  medical decision making (see chart for details).    MDM Rules/Calculators/A&P                           70 year old female who presents with transient weakness of the face, speech difficulty and numbness in all 4 extremities. The differential diagnosis of weakness includes but is not limited to neurologic causes (GBS, myasthenia gravis, CVA, MS, ALS, transverse myelitis, spinal cord injury, CVA, botulism, ) and other causes: ACS, Arrhythmia, syncope, orthostatic hypotension, sepsis, hypoglycemia, electrolyte disturbance, hypothyroidism, respiratory failure, symptomatic anemia, dehydration, heat injury, polypharmacy, malignancy.  I initiated work-up for TIA versus stroke.  I ordered and reviewed CBC which shows leukocytosis which appears to be chronic for the patient, CMP with AKI and near doubling of the patient's creatinine, UA negative for infection however large volume of proteinuria, APTT and PT/INR mildly elevated.  I ordered and reviewed a CT head which appears to be negative at this time.  EKG shows sinus rhythm at a rate of 75.  Case discussed with Dr. Clearence Ped who will admit the patient for TIA work-up.  Patient is aware of plan and is in agreement. Final Clinical Impression(s) / ED Diagnoses Final diagnoses:  TIA (transient ischemic attack)  AKI (acute kidney injury) [facility])    Rx / DC Orders ED Discharge Orders      None        Margarita Mail, PA-C 10/06/21 1118    Sherwood Gambler, MD 10/06/21 1642

## 2021-10-05 NOTE — ED Triage Notes (Signed)
Daughter states at 1650, noted pt with left facial droop and pt was talking with her teeth together with slurred speech.  Daughter states this happened once before about a week ago.  Left ear pain started today.

## 2021-10-06 ENCOUNTER — Observation Stay (HOSPITAL_BASED_OUTPATIENT_CLINIC_OR_DEPARTMENT_OTHER): Payer: 59

## 2021-10-06 ENCOUNTER — Observation Stay (HOSPITAL_COMMUNITY): Payer: 59

## 2021-10-06 DIAGNOSIS — I10 Essential (primary) hypertension: Secondary | ICD-10-CM | POA: Diagnosis not present

## 2021-10-06 DIAGNOSIS — F339 Major depressive disorder, recurrent, unspecified: Secondary | ICD-10-CM | POA: Diagnosis not present

## 2021-10-06 DIAGNOSIS — G459 Transient cerebral ischemic attack, unspecified: Secondary | ICD-10-CM

## 2021-10-06 DIAGNOSIS — N179 Acute kidney failure, unspecified: Secondary | ICD-10-CM

## 2021-10-06 DIAGNOSIS — I4891 Unspecified atrial fibrillation: Secondary | ICD-10-CM

## 2021-10-06 DIAGNOSIS — K219 Gastro-esophageal reflux disease without esophagitis: Secondary | ICD-10-CM

## 2021-10-06 DIAGNOSIS — I639 Cerebral infarction, unspecified: Secondary | ICD-10-CM | POA: Diagnosis not present

## 2021-10-06 LAB — HEMOGLOBIN A1C
Hgb A1c MFr Bld: 4.8 % (ref 4.8–5.6)
Mean Plasma Glucose: 91.06 mg/dL

## 2021-10-06 LAB — CBC
HCT: 37.8 % (ref 36.0–46.0)
Hemoglobin: 11 g/dL — ABNORMAL LOW (ref 12.0–15.0)
MCH: 23.8 pg — ABNORMAL LOW (ref 26.0–34.0)
MCHC: 29.1 g/dL — ABNORMAL LOW (ref 30.0–36.0)
MCV: 81.8 fL (ref 80.0–100.0)
Platelets: 279 10*3/uL (ref 150–400)
RBC: 4.62 MIL/uL (ref 3.87–5.11)
RDW: 21.2 % — ABNORMAL HIGH (ref 11.5–15.5)
WBC: 12.2 10*3/uL — ABNORMAL HIGH (ref 4.0–10.5)
nRBC: 0 % (ref 0.0–0.2)

## 2021-10-06 LAB — COMPREHENSIVE METABOLIC PANEL
ALT: 10 U/L (ref 0–44)
AST: 17 U/L (ref 15–41)
Albumin: 2.8 g/dL — ABNORMAL LOW (ref 3.5–5.0)
Alkaline Phosphatase: 50 U/L (ref 38–126)
Anion gap: 8 (ref 5–15)
BUN: 24 mg/dL — ABNORMAL HIGH (ref 8–23)
CO2: 22 mmol/L (ref 22–32)
Calcium: 7.4 mg/dL — ABNORMAL LOW (ref 8.9–10.3)
Chloride: 104 mmol/L (ref 98–111)
Creatinine, Ser: 1.14 mg/dL — ABNORMAL HIGH (ref 0.44–1.00)
GFR, Estimated: 52 mL/min — ABNORMAL LOW (ref >60)
Glucose, Bld: 112 mg/dL — ABNORMAL HIGH (ref 70–99)
Potassium: 3.5 mmol/L (ref 3.5–5.1)
Sodium: 134 mmol/L — ABNORMAL LOW (ref 135–145)
Total Bilirubin: 0.4 mg/dL (ref 0.3–1.2)
Total Protein: 5.5 g/dL — ABNORMAL LOW (ref 6.5–8.1)

## 2021-10-06 LAB — ECHOCARDIOGRAM COMPLETE
AR max vel: 3.09 cm2
AV Area VTI: 3.12 cm2
AV Area mean vel: 3.4 cm2
AV Mean grad: 6.5 mmHg
AV Peak grad: 13.2 mmHg
Ao pk vel: 1.82 m/s
Area-P 1/2: 3.08 cm2
Height: 65.5 in
MV VTI: 2.49 cm2
S' Lateral: 2.3 cm
Weight: 2176 oz

## 2021-10-06 LAB — LIPID PANEL
Cholesterol: 160 mg/dL (ref 0–200)
HDL: 26 mg/dL — ABNORMAL LOW (ref >40)
LDL Cholesterol: 79 mg/dL (ref 0–99)
Total CHOL/HDL Ratio: 6.2 RATIO
Triglycerides: 273 mg/dL — ABNORMAL HIGH (ref <150)
VLDL: 55 mg/dL — ABNORMAL HIGH (ref 0–40)

## 2021-10-06 LAB — HIV ANTIBODY (ROUTINE TESTING W REFLEX): HIV Screen 4th Generation wRfx: NONREACTIVE

## 2021-10-06 IMAGING — MR MR HEAD W/O CM
11 of 13 series · 25 of 48 positions shown · non-contrast
Comparison: Noncontrast CT head obtained 1 day prior

CLINICAL DATA: TIA, altered mental status, weakness

EXAM:
MRI HEAD WITHOUT CONTRAST
MRA HEAD WITHOUT CONTRAST
TECHNIQUE: Multiplanar, multi-echo pulse sequences of the brain and surrounding
structures were acquired without intravenous contrast. Angiographic
images of the Circle of Willis were acquired using MRA technique
without intravenous contrast.

[Series 5: DWI · axial · 4.0mm · 0.88mm/px · z∈[-74,+58]mm · 2 of 36 slices shown (1 of 6)]
[im 1/36]
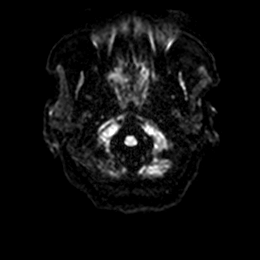
[im 36/36]
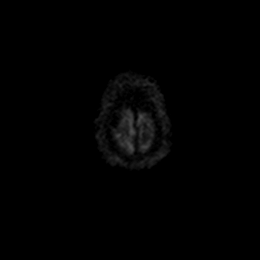

[Series 5: DWI · axial · 4.0mm · 0.88mm/px · z∈[-74,+58]mm · 3 of 36 slices shown (2 of 6)]
[im 1/36]
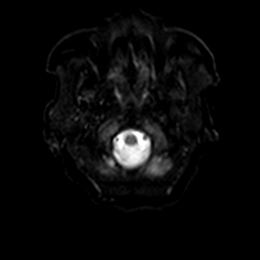
[im 18/36]
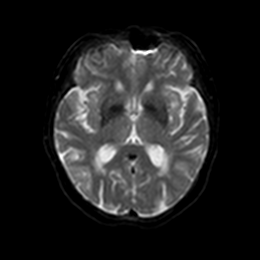
[im 36/36]
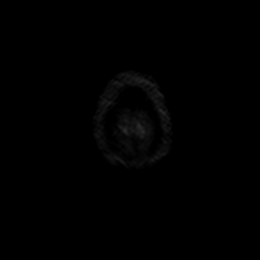

[Series 6: DWI · axial · 4.0mm · 0.88mm/px · z∈[-74,+58]mm · 3 of 36 slices shown (3 of 6)]
[im 1/36]
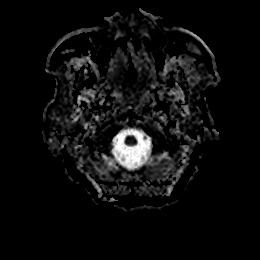
[im 18/36]
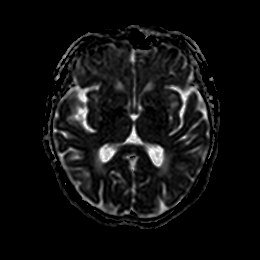
[im 36/36]
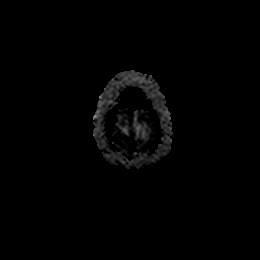

[Series 7: DWI · coronal · 5.0mm · 0.88mm/px · 2 of 28 slices shown (4 of 6)]
[im 1/28]
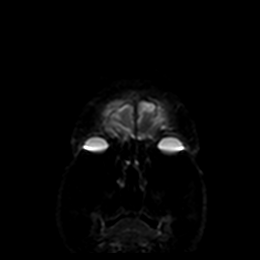
[im 28/28]
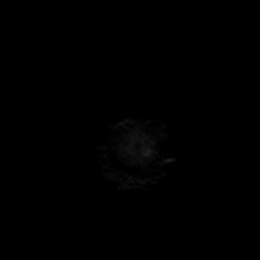

[Series 7: DWI · coronal · 5.0mm · 0.88mm/px · 2 of 28 slices shown (5 of 6)]
[im 1/28]
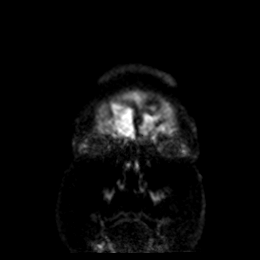
[im 28/28]
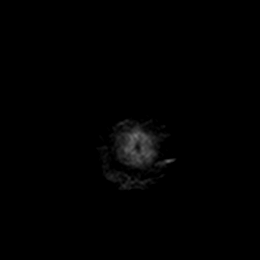

[Series 8: DWI · coronal · 5.0mm · 0.88mm/px · 2 of 28 slices shown (6 of 6)]
[im 1/28]
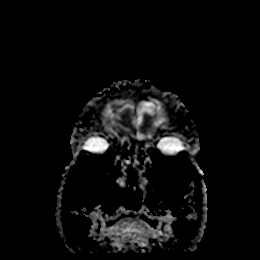
[im 28/28]
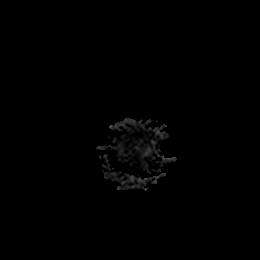

[Series 9: T1 · sagittal · 5.0mm · 0.94mm/px · 2 of 25 slices shown]
[im 1/25]
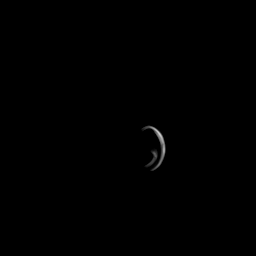
[im 25/25]
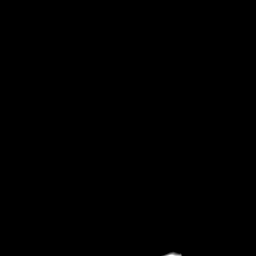

[Series 15: T2 · axial · 5.0mm · 0.72mm/px · z∈[-71,+55]mm · 2 of 20 slices shown (1 of 2)]
[im 1/20]
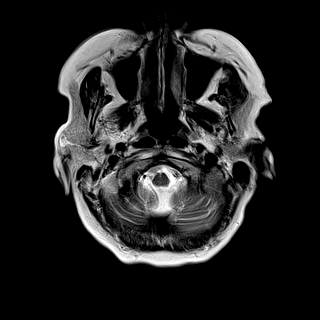
[im 20/20]
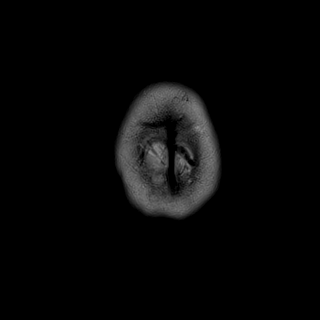

[Series 16: ax hemo · axial · 5.0mm · 0.86mm/px · z∈[-75,+62]mm · 2 of 25 slices shown]
[im 1/25]
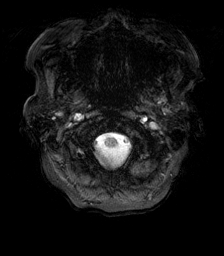
[im 25/25]
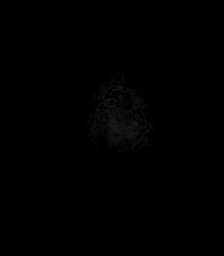

[Series 17: FLAIR · axial · 4.0mm · 0.43mm/px · z∈[-65,+52]mm · 3 of 32 slices shown]
[im 1/32]
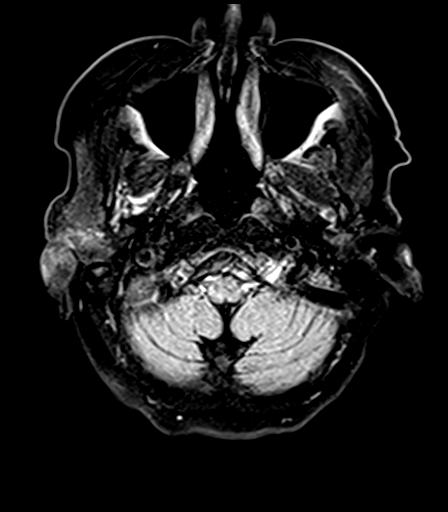
[im 16/32]
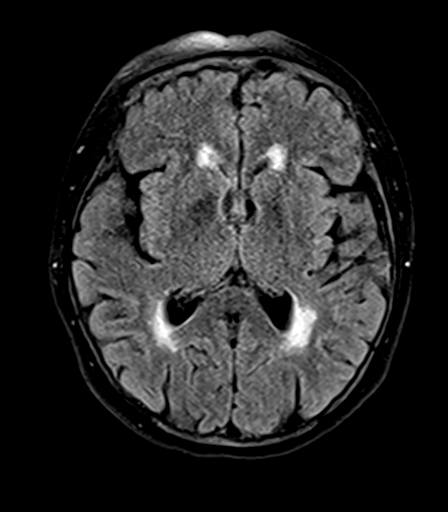
[im 32/32]
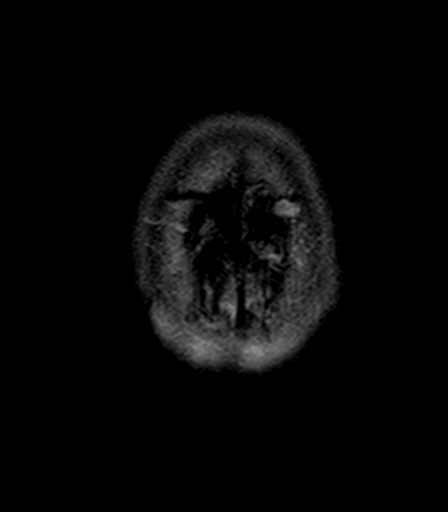

[Series 19: T2 · coronal · 5.0mm · 0.72mm/px · 2 of 28 slices shown (2 of 2)]
[im 1/28]
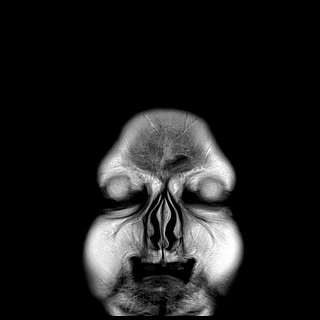
[im 28/28]
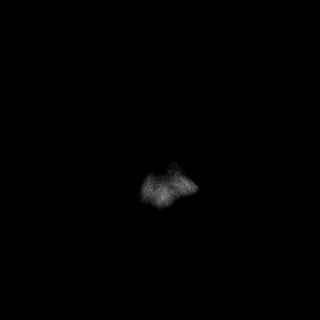

[25 of 48 positions shown; findings below may reference images not displayed]

FINDINGS: MRI HEAD FINDINGS

Brain: There is a tiny focus of faint diffusion restriction in the
right centrum semiovale which could reflect a tiny acute to subacute
infarct. There is no other evidence of acute infarct. There is no
acute intracranial hemorrhage or extra-axial fluid collection.

There is unchanged global parenchymal volume loss. Confluent FLAIR
signal abnormality throughout the subcortical and periventricular
white matter likely reflects sequela of moderate chronic white
matter microangiopathy. There are small remote lacunar infarcts in
the right caudate head and basal ganglia.

There is no mass lesion.  There is no midline shift.

Vascular: The major flow voids are present. The vasculature is
better assessed below.

Skull and upper cervical spine: Normal marrow signal.

Sinuses/Orbits: The imaged paranasal sinuses are clear. Bilateral
lens implants are in place. The globes and orbits are otherwise
unremarkable.

Other: None.

MRA HEAD FINDINGS

Anterior circulation: There is mild irregularity of the intracranial
ICAs likely reflecting atherosclerotic disease. There is no
high-grade stenosis or occlusion.

The bilateral MCAs are patent.

The left A1 segment is diminutive, likely a normal variant. There is
focal moderate stenosis of the left A2 segment (10-148). The
bilateral ACAs are otherwise patent. There is multifocal
irregularity of the right A2 segment with up to severe stenosis

There is no aneurysm.

Posterior circulation: There is minimal flow related enhancement in
the proximal left V4 segment. The distal left V4 segment is patent.
The right V4 segment is patent with mild multifocal irregularity and
narrowing. The basilar artery is patent with mild multifocal
irregularity and narrowing.

There is focal stenosis at the right P1/P2 junction, though the
right PCA is supplied by a prominent right posterior communicating
artery. The bilateral PCAs are otherwise patent with mild multifocal
irregularity and narrowing throughout.

There is no aneurysm.

Anatomic variants: As above.
IMPRESSION: 1. Tiny acute to subacute infarct in the right centrum semiovale. No
other acute infarct or other acute intracranial pathology.
2. Mild global parenchymal volume loss and moderate chronic white
matter microangiopathy.
3. Multifocal irregularity and narrowing of the right A2 segment
with up to severe stenosis, and moderate focal stenosis of the left
A2 segment.
4. Diminutive flow related enhancement in the proximal left V4
segment. This may reflect high-grade stenosis or occlusion due to
atherosclerotic disease with possible retrograde flow via the right
V4 segment. CTA head could be obtained for further evaluation.
5. Focal stenosis at the right P1/P2 junction, though the right PCA
is primarily supplied by a prominent posterior communicating artery.
6. Otherwise, mild irregularity of the remainder of the intracranial
vasculature as detailed above.

## 2021-10-06 MED ORDER — ACETAMINOPHEN 325 MG PO TABS
650.0000 mg | ORAL_TABLET | ORAL | Status: DC | PRN
  Administered 2021-10-08: 650 mg via ORAL
  Filled 2021-10-06: qty 2

## 2021-10-06 MED ORDER — APIXABAN 5 MG PO TABS
5.0000 mg | ORAL_TABLET | Freq: Two times a day (BID) | ORAL | Status: DC
  Administered 2021-10-06 – 2021-10-08 (×6): 5 mg via ORAL
  Filled 2021-10-06 (×6): qty 1

## 2021-10-06 MED ORDER — ESCITALOPRAM OXALATE 10 MG PO TABS
20.0000 mg | ORAL_TABLET | Freq: Every day | ORAL | Status: DC
  Administered 2021-10-06 – 2021-10-08 (×3): 20 mg via ORAL
  Filled 2021-10-06 (×3): qty 2

## 2021-10-06 MED ORDER — STROKE: EARLY STAGES OF RECOVERY BOOK
Freq: Once | Status: AC
  Filled 2021-10-06 (×2): qty 1

## 2021-10-06 MED ORDER — DILTIAZEM HCL ER COATED BEADS 240 MG PO CP24
240.0000 mg | ORAL_CAPSULE | Freq: Every day | ORAL | Status: DC
  Administered 2021-10-06: 240 mg via ORAL
  Filled 2021-10-06: qty 1

## 2021-10-06 MED ORDER — ACETAMINOPHEN 650 MG RE SUPP: 650.0000 mg | RECTAL | Status: DC | PRN

## 2021-10-06 MED ORDER — CHLORTHALIDONE 25 MG PO TABS
25.0000 mg | ORAL_TABLET | Freq: Every day | ORAL | Status: DC
  Administered 2021-10-06: 25 mg via ORAL
  Filled 2021-10-06: qty 1

## 2021-10-06 MED ORDER — PANTOPRAZOLE SODIUM 40 MG PO TBEC
40.0000 mg | DELAYED_RELEASE_TABLET | Freq: Every day | ORAL | Status: DC
  Administered 2021-10-06 – 2021-10-08 (×3): 40 mg via ORAL
  Filled 2021-10-06 (×3): qty 1

## 2021-10-06 MED ORDER — FLECAINIDE ACETATE 50 MG PO TABS
50.0000 mg | ORAL_TABLET | Freq: Two times a day (BID) | ORAL | Status: DC
  Administered 2021-10-06 – 2021-10-08 (×6): 50 mg via ORAL
  Filled 2021-10-06 (×5): qty 1

## 2021-10-06 MED ORDER — EZETIMIBE 10 MG PO TABS
10.0000 mg | ORAL_TABLET | Freq: Every day | ORAL | Status: DC
  Administered 2021-10-06 – 2021-10-08 (×3): 10 mg via ORAL
  Filled 2021-10-06 (×3): qty 1

## 2021-10-06 MED ORDER — FENOFIBRATE 54 MG PO TABS
54.0000 mg | ORAL_TABLET | Freq: Every day | ORAL | Status: DC
  Administered 2021-10-06 – 2021-10-08 (×3): 54 mg via ORAL
  Filled 2021-10-06 (×6): qty 1

## 2021-10-06 MED ORDER — ASPIRIN EC 325 MG PO TBEC
325.0000 mg | DELAYED_RELEASE_TABLET | Freq: Every day | ORAL | Status: DC
  Administered 2021-10-06 – 2021-10-08 (×3): 325 mg via ORAL
  Filled 2021-10-06 (×3): qty 1

## 2021-10-06 MED ORDER — ACETAMINOPHEN 160 MG/5ML PO SOLN: 650.0000 mg | ORAL | Status: DC | PRN

## 2021-10-06 MED ORDER — SENNOSIDES-DOCUSATE SODIUM 8.6-50 MG PO TABS: 1.0000 | ORAL_TABLET | Freq: Every evening | ORAL | Status: DC | PRN

## 2021-10-06 MED ORDER — IRBESARTAN 150 MG PO TABS: 150.0000 mg | ORAL_TABLET | Freq: Every day | ORAL | Status: DC

## 2021-10-06 NOTE — Evaluation (Signed)
Physical Therapy Evaluation Patient Details Name: [name] MRN: [id number] DOB: 07-03-1951 Today's Date: 10/06/2021  History of Present Illness  [name]  is a 70 y.o. female, with history of coronary artery disease, carotid disease bilaterally, GERD, hyperlipidemia, hypertension, mitral regurgitation, paroxysmal atrial fibrillation, statin intolerance, stroke, and more presents the ED with a chief complaint of numbness and weakness.  Patient reports that she laid down to take a nap at 1 PM and when she woke up at 5 PM she had bilateral arms and legs numbness and weakness.  She reports she also had blurry vision.  This all happened upon awakening.  She drove like this to her daughter's house.  She reports they went to get food and then to come to the ER.   Clinical Impression  Patient functioning at baseline for functional mobility and gait demonstrating good return for ambulation in room and hallways without loss of balance.  Plan:  Patient discharged from physical therapy to care of nursing for ambulation daily as tolerated for length of stay.         Recommendations for follow up therapy are one component of a multi-disciplinary discharge planning process, led by the attending physician.  Recommendations may be updated based on patient status, additional functional criteria and insurance authorization.  Follow Up Recommendations No PT follow up;Supervision - Intermittent    Equipment Recommendations  None recommended by PT    Recommendations for Other Services       Precautions / Restrictions Precautions Precautions: Other (comment) Precaution Comments: R AKA. Required Braces or Orthoses: Other Brace Other Brace: RLE prosthetic Restrictions Weight Bearing Restrictions: No      Mobility  Bed Mobility Overal bed mobility: Independent                  Transfers Overall transfer level: Modified independent Equipment used: Rolling walker (2 wheeled)                 Ambulation/Gait Ambulation/Gait assistance: Modified independent (Device/Increase time) Gait Distance (Feet): 100 Feet Assistive device: Rolling walker (2 wheeled) (RLE AKA prosthetic leg) Gait Pattern/deviations: Decreased step length - right;Decreased step length - left;Decreased stride length Gait velocity: decreased   General Gait Details: demonstrates good return for ambulaiton in room and hallways without loss of balance  Stairs            Wheelchair Mobility    Modified Rankin (Stroke Patients Only)       Balance Overall balance assessment: No apparent balance deficits (not formally assessed)                                           Pertinent Vitals/Pain Pain Assessment: No/denies pain    Home Living Family/patient expects to be discharged to:: Private residence Living Arrangements: Other relatives Available Help at Discharge: Family;Available PRN/intermittently Type of Home: House Home Access: Stairs to enter Entrance Stairs-Rails: None Entrance Stairs-Number of Steps: 2 Home Layout: Two level;Able to live on main level with bedroom/bathroom Home Equipment: [name] - 2 wheels;Wheelchair - manual;Toilet riser;Shower seat      Prior Function Level of Independence: Independent with assistive device(s)               Hand Dominance   Dominant Hand: Right    Extremity/Trunk Assessment   Upper Extremity Assessment Upper Extremity Assessment: Defer to OT evaluation    Lower  Extremity Assessment Lower Extremity Assessment: Overall WFL for tasks assessed       Communication   Communication: No difficulties  Cognition Arousal/Alertness: Awake/alert Behavior During Therapy: WFL for tasks assessed/performed Overall Cognitive Status: Within Functional Limits for tasks assessed                                 General Comments: Had difficulty with word finding. Did not know the date. Month and year were  correct. Patient was oriented to situation. Did not know the president. Difficulty remembering two step commands. Able to follow one step commands without difficulty.      General Comments      Exercises     Assessment/Plan    PT Assessment Patent does not need any further PT services  PT Problem List         PT Treatment Interventions      PT Goals (Current goals can be found in the Care Plan section)  Acute Rehab PT Goals Patient Stated Goal: to go home PT Goal Formulation: With patient Time For Goal Achievement: 10/06/21 Potential to Achieve Goals: Good    Frequency     Barriers to discharge        Co-evaluation PT/OT/SLP Co-Evaluation/Treatment: Yes Reason for Co-Treatment: To address functional/ADL transfers PT goals addressed during session: Mobility/safety with mobility;Balance;Proper use of DME OT goals addressed during session: ADL's and self-care;Strengthening/ROM       AM-PAC PT "6 Clicks" Mobility  Outcome Measure Help needed turning from your back to your side while in a flat bed without using bedrails?: None Help needed moving from lying on your back to sitting on the side of a flat bed without using bedrails?: None Help needed moving to and from a bed to a chair (including a wheelchair)?: None Help needed standing up from a chair using your arms (e.g., wheelchair or bedside chair)?: None Help needed to walk in hospital room?: None Help needed climbing 3-5 steps with a railing? : None 6 Click Score: 24    End of Session Equipment Utilized During Treatment: Other (comment) (RLE AKA prosthetic leg) Activity Tolerance: Patient tolerated treatment well Patient left: in bed;with call bell/phone within reach Nurse Communication: Mobility status PT Visit Diagnosis: Unsteadiness on feet (R26.81);Other abnormalities of gait and mobility (R26.89);Muscle weakness (generalized) (M62.81)    Time: 4132-4401 PT Time Calculation (min) (ACUTE ONLY): 20  min   Charges:   PT Evaluation $PT Eval Moderate Complexity: 1 Mod PT Treatments $Therapeutic Activity: 8-22 mins        12:14 PM, 10/06/21 Lonell Grandchild, MPT Physical Therapist with [facility] 336 [phone] office [phone] mobile phone

## 2021-10-06 NOTE — Progress Notes (Signed)
  Patient placed in observation after midnight, please see H&P  Suspected TIA CT head showed no acute intracranial abnormality MRI brain pending, echo pending Patient has an intolerance to statins-LDL: 79 PT/OT/ST eval and treat High risk of stroke given paroxysmal atrial fibrillation-continue Eliquis Benign essential hypertension Since patient has returned to baseline spontaneously, no permissive hypertension at this time Continue chlorthalidone, diltiazem,  Hold ARB in the setting of AKI Continue to monitor Paroxysmal atrial fibrillation Continue flecainide Continue Eliquis GERD Continue Protonix AKI Creatinine bumped from 0.8 to 1.25 Unclear etiology as patient reports normal p.o. intake Continue to encourage p.o. intake Trend with morning labs Hold nephrotoxic agents when possible   Patient denies any lingering symptoms  Eulogio Bear
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
## 2021-10-06 NOTE — H&P (Signed)
TRH H&P    Patient Demographics:    [name], is a 70 y.o. female  MRN: [id number]  DOB - 04-19-1951  Admit Date - 10/05/2021  Referring MD/NP/PA: Vernie Shanks  Outpatient Primary MD for the patient is Ivy Lynn, NP  Patient coming from: Home  Chief complaint- weakness   HPI:    [name]  is a 70 y.o. female, with history of coronary artery disease, carotid disease bilaterally, GERD, hyperlipidemia, hypertension, mitral regurgitation, paroxysmal atrial fibrillation, statin intolerance, stroke, and more presents the ED with a chief complaint of numbness and weakness.  Patient reports that she laid down to take a nap at 1 PM and when she woke up at 5 PM she had bilateral arms and legs numbness and weakness.  She reports she also had blurry vision.  This all happened upon awakening.  She drove like this to her daughter's house.  She reports they went to get food and then to come to the ER.  Patient reports that she had no difficulty speaking, no difficulty swallowing.  She reports no unsteadiness in her gait.  Then she noticed no weakness in one area more than any other.  Her daughter who was at bedside in the emergency room prior to admission noted a left facial droop, slurred speech, and reports the same thing happened couple weeks ago.  The whole thing lasted 30 minutes according to the patient.  Over the whole 30 minutes it was gradually getting better.  It resolved on its own.  Patient reports that she ambulates with a walker because of her prosthetic, and her ambulation has been at baseline.  Patient reports that she is not completely back to baseline.  She denies any history of stroke, however there is one documented in her history.  Patient has no other complaints at this time.  She does report that yesterday she had a headache and some nausea but no vomiting.  Her appetite has been normal.  She does not  think she drinks enough water.  Patient does not smoke, does not drink, does not use illicit drugs.  Patient is not vaccinated for COVID.  Patient is DNR.  In the ED Temp 98.7, heart rate 67-78, respiratory 16-22, blood pressure 121/72, satting 96% Leukocytosis of 18 which has improved to 12.2 on morning labs Hemoglobin is stable at 12.9, platelets 358 Chemistry panel reveals a creatinine bumped at 1.25 baseline is 0.8 Negative respiratory panel CT head shows no acute intracranial abnormality EKG shows a heart rate of 75, sinus rhythm, QTC 467, UA is not indicative of UTI, UDS is negative 1 L normal saline given in the ED   Review of systems:    In addition to the HPI above,  No Fever-chills, Headache yesterday,, No changes with Vision or hearing, No problems swallowing food or Liquids, No Chest pain, Cough or Shortness of Breath, No Abdominal pain, admits to nausea but no vomiting, bowel movements are regular, No Blood in stool or Urine, No dysuria, No new skin rashes or bruises, No new joints  pains-aches,  No recent weight gain or loss, No polyuria, polydypsia or polyphagia, No significant Mental Stressors.  All other systems reviewed and are negative.    Past History of the following :    Past Medical History:  Diagnosis Date   Arthritis    "fingers, some in my knees" (01/28/2016)   CAD (coronary artery disease), per cath 04/27/13, non obstructive disease, EF 65-70% 05/11/2013   Carotid disease, bilateral ([place]) 09/2013   Lt carotid 50-69% stentosis, less on rt   Dysrhythmia    Fatty liver    GERD (gastroesophageal reflux disease)    Glaucoma    H/O cardiovascular stress test 12/27/2009   negative for ischemia   Headache    "occasional since eye pressure regulated" (01/28/2016)   Hyperlipidemia    Hypertension    Leukocytosis 07/15/2015   Migraine    "stopped w/laser holes to relieve pressure in my eyes; had them 3-4 times/wk before taht" (01/28/2016)   Mitral  regurgitation,2+ by cath 05/11/2013   NSTEMI (non-ST elevated myocardial infarction) ([place]) 04/27/2013   Pain    BOTH KNEES - PT HAS TORN MENISCUS LEFT KNEE   Paroxysmal atrial fibrillation (HCC)    hx of a fib on ASA  AND ELIQUIS    Polycythemia vera ([place]) 08/20/2015   JAK-2 positive 07/19/15   PVD (peripheral vascular disease) with claudication ([place]) 07/2006   previous L ext iliac stent and rt SFA stent, known occluded Lt SFA   S/P cardiac cath 04/27/13   NON OBSTRUCTIVE DISEASE, 2+ mr, ef 65-70%   Statin intolerance    Stroke ([place]) 2006   SWELLING ALL OVER AND RT SIDE OF FACE DRAWN AND SPEECH SLURRED AND NUMBNESS ON RIGHT SIDE-- ALL RESOLVED      Past Surgical History:  Procedure Laterality Date   AMPUTATION Right 05/21/2017   Procedure: AMPUTATION ABOVE KNEE;  Surgeon: Rosetta Posner, MD;  Location: [facility];  Service: Vascular;  Laterality: Right;   CARDIAC CATHETERIZATION  04/27/13   non occlusive disease with mild to mod. calcified lesions in ostial LM and proximal LAD and moderate prox. RC AND DISTAL AV GROOVE LCX, ef   CATARACT EXTRACTION W/PHACO Right 03/21/2015   Procedure: CATARACT EXTRACTION PHACO AND INTRAOCULAR LENS PLACEMENT RIGHT EYE;  Surgeon: Baruch Goldmann, MD;  [facility]: [facility];  Service: Ophthalmology;  Laterality: Right;  CDE:5.10   CATARACT EXTRACTION W/PHACO Left 05/16/2015   Procedure: CATARACT EXTRACTION PHACO AND INTRAOCULAR LENS PLACEMENT (IOC);  Surgeon: Baruch Goldmann, MD;  Location: [facility];  Service: Ophthalmology;  Laterality: Left;  CDE 5.57   CESAREAN SECTION  1977   COLONOSCOPY N/A 09/11/2016   Procedure: COLONOSCOPY;  Surgeon: Rogene Houston, MD;  [facility];  Service: Endoscopy;  Laterality: N/A;  730-moved to 1:00 Ann notified pt   EMBOLECTOMY Right 02/01/2016   Procedure: Thrombectomy of Right Femoral-Popliteal Bypass Graft; Endarterectomy of Right Below Knee Popliteal Artery and Tibial Peroneal Trunk with Bovine Pericardium Patch Angioplasty.;  Surgeon:  Angelia Mould, MD;  Location: [facility];  Service: Vascular;  Laterality: Right;   FEMORAL-POPLITEAL BYPASS GRAFT Right 01/31/2016   Procedure: BYPASS GRAFT RIGHT COMMON  FEMORALTO BELOW KNEE POPLITEAL ARTERY BYPASS GRAFT USING 6MM PROPATEN GORTEX GRAFT;  Surgeon: Mal Misty, MD;  Location: [facility];  Service: Vascular;  Laterality: Right;   FEMORAL-POPLITEAL BYPASS GRAFT Right 01/09/2017   Procedure: THROMBECTOMY OF RIGHT FEMORAL-POPLITEAL  ARTERY BYPASS GRAFT; THROMBECTOMY RIGHT  TIBIAL VESSELS;  Surgeon: [name], MD;  [facility];  Service: Vascular;  Laterality: Right;   FEMORAL-POPLITEAL BYPASS GRAFT Right 05/17/2017   Procedure: RIGHT FEMORAL-TIBIAL PERONEAL TRUNK ARTERY BYPASS GRAFT USING 70mmX80cm PROPATEN GRAFT WITH REMOVABLE RING;  Surgeon: Rosetta Posner, MD;  Location: [facility];  Service: Vascular;  Laterality: Right;   FRACTURE SURGERY     ILIAC ARTERY STENT  07/2006   external iliac and Rt SFA stent 07/2006 AND THE LEFT WAS IN 2006   KNEE ARTHROSCOPY WITH MEDIAL MENISECTOMY Left 03/27/2014   Procedure: left knee arthorscopy with medial chondraplasty of the medial femoral and patella, medial microfracture technique of medial femoral condyl;  Surgeon: Tobi Bastos, MD;  Location: [facility];  Service: Orthopedics;  Laterality: Left;   LEFT HEART CATHETERIZATION WITH CORONARY ANGIOGRAM Right 04/27/2013   Procedure: LEFT HEART CATHETERIZATION WITH CORONARY ANGIOGRAM;  Surgeon: Leonie Man, MD;  Location: [facility] CATH LAB;  Service: Cardiovascular;  Laterality: Right;   LOWER EXTREMITY ANGIOGRAM Right 01/31/2016   Procedure: INTRAOP RIGHT LOWER EXTREMITY ANGIOGRAM;  Surgeon: Mal Misty, MD;  Location: [facility];  Service: Vascular;  Laterality: Right;   ORIF WRIST FRACTURE Right 1983   OVARIAN CYST REMOVAL Right    PATCH ANGIOPLASTY Right 01/31/2016   Procedure: RIGHT COMMON FEMORAL AND PROFUNDA FEMORIS ENDARECTOMY WITH PATCH ANGIOPLASTY;  Surgeon: Mal Misty, MD;  Location: [facility];   Service: Vascular;  Laterality: Right;   PATCH ANGIOPLASTY Right 01/09/2017   Procedure: PATCH ANGIOPLASTY RIGHT POPLITEAL ARTERY BYPASS GRAFT;  Surgeon: [name], MD;  Location: [facility];  Service: Vascular;  Laterality: Right;   PERIPHERAL VASCULAR CATHETERIZATION N/A 06/27/2015   Procedure: Lower Extremity Angiography;  Surgeon: Lorretta Harp, MD;  Location: [facility];  Service: Cardiovascular;  Laterality: N/A;   PERIPHERAL VASCULAR CATHETERIZATION Bilateral 01/29/2016   Procedure: Lower Extremity Angiography;  Surgeon: Wellington Hampshire, MD;  Location: [facility];  Service: Cardiovascular;  Laterality: Bilateral;   PERIPHERAL VASCULAR CATHETERIZATION N/A 01/29/2016   Procedure: Abdominal Aortogram;  Surgeon: Wellington Hampshire, MD;  Location: [facility];  Service: Cardiovascular;  Laterality: N/A;   REFRACTIVE SURGERY Bilateral    "6 in one eye; 7 in the other; to relieve pressure; not glaucoma" (01/28/2016)   SFA Right 06/27/2015   overlapping Bahn covered stents   [place] Left 05/17/2017   Procedure: LEFT LEG GREATER SAPHENOUS VEIN HARVEST;  Surgeon: Rosetta Posner, MD;  Location: [facility];  Service: Vascular;  Laterality: Left;   VEIN REPAIR Right 01/31/2016   Procedure: RIGHT GREATER SAPHENOUS VEIN EXAMNED BUT NOT REMOVED;  Surgeon: Mal Misty, MD;  [facility];  Service: Vascular;  Laterality: Right;      Social History:      Social History   Tobacco Use   Smoking status: Former    Packs/day: 1.00    Years: 45.00    Pack years: 45.00    Types: Cigarettes    Quit date: 11/11/2012    Years since quitting: 8.9   Smokeless tobacco: Never   Tobacco comments:    quit 4 years ago  Substance Use Topics   Alcohol use: No    Alcohol/week: 0.0 standard drinks       Family History :     Family History  Problem Relation Age of Onset   CAD Mother    Lung cancer Mother    Heart attack Mother    CAD Father  Heart  disease Father    Heart attack Father 69       died in hes 73's with heart disease   Healthy Sister    Liver cancer Brother    Healthy Sister    CAD Brother        has had CABG   CAD Brother       Home Medications:   Prior to Admission medications   Medication Sig Start Date End Date Taking? Authorizing Provider  acetaminophen (TYLENOL) 500 MG tablet Take 2 tablets (1,000 mg total) by mouth every 6 (six) hours. 09/16/18   Norm Parcel, PA-C  alendronate (FOSAMAX) 70 MG tablet TAKE 1 TABLET ONCE A WEEK Patient not taking: Reported on 09/10/2021 01/22/21   Ivy Lynn, NP  ARTIFICIAL TEAR OP Place 1 drop into both eyes as needed (for dry eyes).    [provider]  aspirin EC 325 MG tablet Take 325 mg by mouth daily.    [provider]  calcium-vitamin D (OSCAL WITH D) 500-200 MG-UNIT tablet Take 1 tablet by mouth daily with breakfast. 06/09/21   Ivy Lynn, NP  chlorthalidone (HYGROTON) 25 MG tablet Take 1 tablet (25 mg total) by mouth daily. 01/22/21   Ivy Lynn, NP  diclofenac Sodium (VOLTAREN) 1 % GEL Apply 2 g topically 4 (four) times daily. 09/03/21   Ivy Lynn, NP  diltiazem (CARDIZEM CD) 240 MG 24 hr capsule TAKE 1 CAPSULE EVERY MORNING Patient taking differently: Take 240 mg by mouth daily. 08/11/21   Ivy Lynn, NP  ELIQUIS 5 MG TABS tablet TAKE ONE TABLET TWICE DAILY 10/01/21   Ivy Lynn, NP  escitalopram (LEXAPRO) 20 MG tablet TAKE 1 TABLET DAILY 09/15/21   Ivy Lynn, NP  ezetimibe (ZETIA) 10 MG tablet TAKE 1 TABLET DAILY 09/15/21   Ivy Lynn, NP  fenofibrate (TRICOR) 145 MG tablet Take 1 tablet (145 mg total) by mouth daily. (NEEDS TO BE SEEN BEFORE NEXT REFILL) Patient not taking: Reported on 09/10/2021 06/04/21   Ivy Lynn, NP  fenofibrate (TRICOR) 145 MG tablet TAKE 1 TABLET DAILY 09/15/21   Ivy Lynn, NP  flecainide (TAMBOCOR) 50 MG tablet TAKE 1 TABLET 2 TIMES A DAY Patient taking differently:  Take 50 mg by mouth 2 (two) times daily. 07/14/21   Ivy Lynn, NP  hydroxyurea (HYDREA) 500 MG capsule Take 1 capsule (500 mg total) by mouth daily. Take one capsule daily except Sundays. May take with food to minimize GI side effects. 07/17/21   Truitt Merle, MD  lisinopril (ZESTRIL) 20 MG tablet Take 20 mg by mouth daily. 10/28/17   [provider]  mupirocin ointment (BACTROBAN) 2 % APPLY TO AFFECTED AREA 3)TIMES A DAY FOR 1 WEEK 03/15/20   [provider]  omeprazole (PRILOSEC) 20 MG capsule TAKE 1 CAPSULE 2 TIMES A DAY BEFORE A MEAL Patient taking differently: Take 20 mg by mouth 2 (two) times daily before a meal. 09/02/21   Ivy Lynn, NP  traMADol (ULTRAM) 50 MG tablet Take 1 tablet (50 mg total) by mouth 2 (two) times daily. 06/04/21   Ivy Lynn, NP  triamcinolone cream (KENALOG) 0.1 % Apply 1 g topically 1 day or 1 dose. 07/10/20   [provider]  valsartan (DIOVAN) 160 MG tablet TAKE ONE TABLET ONCE DAILY Patient taking differently: Take 160 mg by mouth daily. 06/10/21   Ivy Lynn, NP     Allergies:  Allergies  Allergen Reactions   Atenolol Rash and Itching   Demerol  [Meperidine Hcl] Itching   Demerol [Meperidine] Other (See Comments) and Itching    Unknown, can't remember  Unknown, can't remember    Gabapentin Anxiety and Other (See Comments)    SI and HI   Inderal [Propranolol] Other (See Comments) and Itching    Doesn't remember    Prednisone Other (See Comments)    Muscle spasms   Statins Itching and Other (See Comments)    Simvastatin-caused severe itching Pravastatin-caused lesser tiching One type of statin? Caused stroke in 2006, and other symptoms as result   Warfarin And Related Other (See Comments)    Caused nose bleeds   Crestor [Rosuvastatin] Itching and Rash   Docosahexaenoic Acid (Dha)     Other reaction(s): Other (See Comments) nosebleeds   Lactose Intolerance (Gi) Other (See Comments)    Bloating, gas    Lipitor [Atorvastatin] Rash   Lovaza [Omega-3-Acid Ethyl Esters] Other (See Comments)    nosebleeds    Penicillins Hives, Rash and Other (See Comments)    Has patient had a PCN reaction causing immediate rash, facial/tongue/throat swelling, SOB or lightheadedness with hypotension: Yes Has patient had a PCN reaction causing severe rash involving mucus membranes or skin necrosis: No Has patient had a PCN reaction that required hospitalization: No Has patient had a PCN reaction occurring within the last 10 years: No If all of the above answers are "NO", then may proceed with Cephalosporin use.  Questionable high fever    Pravastatin Itching and Rash   Simvastatin Itching, Rash and Other (See Comments)   Trilipix [Choline Fenofibrate] Other (See Comments)    Doesn't remember    Warfarin Other (See Comments)    nosebleeds   Hydralazine Swelling   Effexor [Venlafaxine Hcl] Other (See Comments)    Doesn't remember    Nexium [Esomeprazole Magnesium] Rash   Percocet [Oxycodone-Acetaminophen] Other (See Comments)    Doesn't remember    Plavix [Clopidogrel Bisulfate] Rash     Physical Exam:   Vitals  Blood pressure (!) 146/89, pulse 69, temperature 98.7 F (37.1 C), temperature source Oral, resp. rate 16, height 5' 5.5" (1.664 m), weight 61.7 kg, SpO2 99 %.  1.  General: Patient lying supine in bed,  no acute distress   2. Psychiatric: Alert and oriented x 3, mood and behavior normal for situation, pleasant and cooperative with exam   3. Neurologic: Speech and language are normal, face is symmetric, moves all 4 extremities voluntarily, equal strength in all 4 extremities, normal finger-nose, no pronator drift at baseline without acute deficits on limited exam   4. HEENMT:  Head is atraumatic, normocephalic, pupils reactive to light, neck is supple, trachea is midline, mucous membranes are moist   5. Respiratory : Lungs are clear to auscultation bilaterally without wheezing,  rhonchi, rales, no cyanosis, no increase in work of breathing or accessory muscle use   6. Cardiovascular : Heart rate normal, rhythm is regular, systolic murmur present, rubs or gallops, no peripheral edema, peripheral pulses palpated   7. Gastrointestinal:  Abdomen is soft, nondistended, nontender to palpation bowel sounds active, no masses or organomegaly palpated   8. Skin:  Skin is warm, dry and intact without rashes, acute lesions, or ulcers on limited exam   9.Musculoskeletal:  No acute deformities or trauma, no asymmetry in tone, no peripheral edema, peripheral pulses palpated, no tenderness to palpation in the extremities     Data Review:  CBC Recent Labs  Lab 10/05/21 2103 10/06/21 0203  WBC 18.1* 12.2*  HGB 12.9 11.0*  HCT 44.9 37.8  PLT 358 279  MCV 80.2 81.8  MCH 23.0* 23.8*  MCHC 28.7* 29.1*  RDW 21.4* 21.2*  LYMPHSABS 1.5  --   MONOABS 0.5  --   EOSABS 0.4  --   BASOSABS 0.2*  --    ------------------------------------------------------------------------------------------------------------------  Results for orders placed or performed during the hospital encounter of 10/05/21 (from the past 48 hour(s))  CBG monitoring, ED     Status: Abnormal   Collection Time: 10/05/21  8:36 PM  Result Value Ref Range   Glucose-Capillary 142 (H) 70 - 99 mg/dL    Comment: Glucose reference range applies only to samples taken after fasting for at least 8 hours.  CBG monitoring, ED     Status: Abnormal   Collection Time: 10/05/21  9:02 PM  Result Value Ref Range   Glucose-Capillary 129 (H) 70 - 99 mg/dL    Comment: Glucose reference range applies only to samples taken after fasting for at least 8 hours.  Ethanol     Status: None   Collection Time: 10/05/21  9:03 PM  Result Value Ref Range   Alcohol, Ethyl (B) <10 <10 mg/dL    Comment: (NOTE) Lowest detectable limit for serum alcohol is 10 mg/dL.  For medical purposes only. Performed at [facility], [address] [place], [place] [address]   Protime-INR     Status: Abnormal   Collection Time: 10/05/21  9:03 PM  Result Value Ref Range   Prothrombin Time 15.7 (H) 11.4 - 15.2 seconds   INR 1.3 (H) 0.8 - 1.2    Comment: (NOTE) INR goal varies based on device and disease states. Performed at [facility], [address], [place], [place] [address]   APTT     Status: Abnormal   Collection Time: 10/05/21  9:03 PM  Result Value Ref Range   aPTT 53 (H) 24 - 36 seconds    Comment:        IF BASELINE aPTT IS ELEVATED, SUGGEST PATIENT RISK ASSESSMENT BE USED TO DETERMINE APPROPRIATE ANTICOAGULANT THERAPY. Performed at [facility], [address] [place], [place] [address]   CBC     Status: Abnormal   Collection Time: 10/05/21  9:03 PM  Result Value Ref Range   WBC 18.1 (H) 4.0 - 10.5 K/uL   RBC 5.60 (H) 3.87 - 5.11 MIL/uL   Hemoglobin 12.9 12.0 - 15.0 g/dL   HCT 44.9 36.0 - 46.0 %   MCV 80.2 80.0 - 100.0 fL   MCH 23.0 (L) 26.0 - 34.0 pg   MCHC 28.7 (L) 30.0 - 36.0 g/dL   RDW 21.4 (H) 11.5 - 15.5 %   Platelets 358 150 - 400 K/uL   nRBC 0.0 0.0 - 0.2 %    Comment: Performed at [facility], [address], [place], [place] [address]  Differential     Status: Abnormal   Collection Time: 10/05/21  9:03 PM  Result Value Ref Range   Neutrophils Relative % 85 %   Neutro Abs 15.3 (H) 1.7 - 7.7 K/uL   Lymphocytes Relative 8 %   Lymphs Abs 1.5 0.7 - 4.0 K/uL   Monocytes Relative 3 %   Monocytes Absolute 0.5 0.1 - 1.0 K/uL   Eosinophils Relative 2 %   Eosinophils Absolute 0.4 0.0 - 0.5 K/uL   Basophils Relative 1 %   Basophils Absolute 0.2 (H) 0.0 -  0.1 K/uL   RBC Morphology ANISOCYTOSIS PRESENT    Smear Review PLATELET COUNT CONFIRMED BY SMEAR    Immature Granulocytes 1 %   Abs Immature Granulocytes 0.22 (H) 0.00 - 0.07 K/uL   Reactive, Benign Lymphocytes PRESENT    Ovalocytes PRESENT     Comment: Performed at [facility], [address] [place], [place] [address]  Comprehensive  metabolic panel     Status: Abnormal   Collection Time: 10/05/21  9:03 PM  Result Value Ref Range   Sodium 132 (L) 135 - 145 mmol/L   Potassium 3.9 3.5 - 5.1 mmol/L   Chloride 104 98 - 111 mmol/L   CO2 20 (L) 22 - 32 mmol/L   Glucose, Bld 125 (H) 70 - 99 mg/dL    Comment: Glucose reference range applies only to samples taken after fasting for at least 8 hours.   BUN 27 (H) 8 - 23 mg/dL   Creatinine, Ser 1.25 (H) 0.44 - 1.00 mg/dL   Calcium 8.0 (L) 8.9 - 10.3 mg/dL   Total Protein 6.3 (L) 6.5 - 8.1 g/dL   Albumin 3.1 (L) 3.5 - 5.0 g/dL   AST 18 15 - 41 U/L   ALT 12 0 - 44 U/L   Alkaline Phosphatase 56 38 - 126 U/L   Total Bilirubin 0.5 0.3 - 1.2 mg/dL   GFR, Estimated 46 (L) >60 mL/min    Comment: (NOTE) Calculated using the CKD-EPI Creatinine Equation (2021)    Anion gap 8 5 - 15    Comment: Performed at [facility], [address], [place], [place] [address]  Resp Panel by RT-PCR (Flu A&B, Covid) Nasopharyngeal Swab     Status: None   Collection Time: 10/05/21  9:24 PM   Specimen: Nasopharyngeal Swab; Nasopharyngeal(NP) swabs in vial transport medium  Result Value Ref Range   SARS Coronavirus 2 by RT PCR NEGATIVE NEGATIVE    Comment: (NOTE) SARS-CoV-2 target nucleic acids are NOT DETECTED.  The SARS-CoV-2 RNA is generally detectable in upper respiratory specimens during the acute phase of infection. The lowest concentration of SARS-CoV-2 viral copies this assay can detect is 138 copies/mL. A negative result does not preclude SARS-Cov-2 infection and should not be used as the sole basis for treatment or other patient management decisions. A negative result may occur with  improper specimen collection/handling, submission of specimen other than nasopharyngeal swab, presence of viral mutation(s) within the areas targeted by this assay, and inadequate number of viral copies(<138 copies/mL). A negative result must be combined with clinical observations, patient history, and  epidemiological information. The expected result is Negative.  Fact Sheet for Patients:  EntrepreneurPulse.com.au  Fact Sheet for Healthcare Providers:  IncredibleEmployment.be  This test is no t yet approved or cleared by the [place] FDA and  has been authorized for detection and/or diagnosis of SARS-CoV-2 by FDA under an Emergency Use Authorization (EUA). This EUA will remain  in effect (meaning this test can be used) for the duration of the COVID-19 declaration under Section 564(b)(1) of the Act, 21 U.S.C.section 360bbb-3(b)(1), unless the authorization is terminated  or revoked sooner.       Influenza A by PCR NEGATIVE NEGATIVE   Influenza B by PCR NEGATIVE NEGATIVE    Comment: (NOTE) The Xpert Xpress SARS-CoV-2/FLU/RSV plus assay is intended as an aid in the diagnosis of influenza from Nasopharyngeal swab specimens and should not be used as a sole basis for treatment. Nasal washings and aspirates are unacceptable for Xpert Xpress SARS-CoV-2/FLU/RSV  testing.  Fact Sheet for Patients: EntrepreneurPulse.com.au  Fact Sheet for Healthcare Providers: IncredibleEmployment.be  This test is not yet approved or cleared by the [place] FDA and has been authorized for detection and/or diagnosis of SARS-CoV-2 by FDA under an Emergency Use Authorization (EUA). This EUA will remain in effect (meaning this test can be used) for the duration of the COVID-19 declaration under Section 564(b)(1) of the Act, 21 U.S.C. section 360bbb-3(b)(1), unless the authorization is terminated or revoked.  Performed at [facility], [address], [place], [place] [address]   Urine rapid drug screen (hosp performed)     Status: None   Collection Time: 10/05/21 11:06 PM  Result Value Ref Range   Opiates NONE DETECTED NONE DETECTED   Cocaine NONE DETECTED NONE DETECTED   Benzodiazepines NONE DETECTED NONE DETECTED    Amphetamines NONE DETECTED NONE DETECTED   Tetrahydrocannabinol NONE DETECTED NONE DETECTED   Barbiturates NONE DETECTED NONE DETECTED    Comment: (NOTE) DRUG SCREEN FOR MEDICAL PURPOSES ONLY.  IF CONFIRMATION IS NEEDED FOR ANY PURPOSE, NOTIFY LAB WITHIN 5 DAYS.  LOWEST DETECTABLE LIMITS FOR URINE DRUG SCREEN Drug Class                     Cutoff (ng/mL) Amphetamine and metabolites    1000 Barbiturate and metabolites    200 Benzodiazepine                 443 Tricyclics and metabolites     300 Opiates and metabolites        300 Cocaine and metabolites        300 THC                            50 Performed at [facility], [address], [place], [place] [address]   Urinalysis, Routine w reflex microscopic Urine, Clean Catch     Status: Abnormal   Collection Time: 10/05/21 11:06 PM  Result Value Ref Range   Color, Urine YELLOW YELLOW   APPearance CLEAR CLEAR   Specific Gravity, Urine 1.009 1.005 - 1.030   pH 5.0 5.0 - 8.0   Glucose, UA 50 (A) NEGATIVE mg/dL   Hgb urine dipstick NEGATIVE NEGATIVE   Bilirubin Urine NEGATIVE NEGATIVE   Ketones, ur NEGATIVE NEGATIVE mg/dL   Protein, ur >=300 (A) NEGATIVE mg/dL   Nitrite NEGATIVE NEGATIVE   Leukocytes,Ua NEGATIVE NEGATIVE   RBC / HPF 0-5 0 - 5 RBC/hpf   WBC, UA 0-5 0 - 5 WBC/hpf   Bacteria, UA RARE (A) NONE SEEN   Squamous Epithelial / LPF 0-5 0 - 5    Comment: Performed at [facility], [address], [place],  [address]  Lipid panel     Status: Abnormal   Collection Time: 10/06/21  2:03 AM  Result Value Ref Range   Cholesterol 160 0 - 200 mg/dL   Triglycerides 273 (H) <150 mg/dL   HDL 26 (L) >40 mg/dL   Total CHOL/HDL Ratio 6.2 RATIO   VLDL 55 (H) 0 - 40 mg/dL   LDL Cholesterol 79 0 - 99 mg/dL    Comment:        Total Cholesterol/HDL:CHD Risk Coronary Heart Disease Risk Table                     Men   Women  1/2 Average Risk   3.4   3.3  Average Risk  5.0   4.4  2 X Average Risk   9.6   7.1  3 X Average  Risk  23.4   11.0        Use the calculated Patient Ratio above and the CHD Risk Table to determine the patient's CHD Risk.        ATP III CLASSIFICATION (LDL):  <100     mg/dL   Optimal  100-129  mg/dL   Near or Above                    Optimal  130-159  mg/dL   Borderline  160-189  mg/dL   High  >190     mg/dL   Very High Performed at [facility], [address] [place], [place] [address]   CBC     Status: Abnormal   Collection Time: 10/06/21  2:03 AM  Result Value Ref Range   WBC 12.2 (H) 4.0 - 10.5 K/uL   RBC 4.62 3.87 - 5.11 MIL/uL   Hemoglobin 11.0 (L) 12.0 - 15.0 g/dL   HCT 37.8 36.0 - 46.0 %   MCV 81.8 80.0 - 100.0 fL   MCH 23.8 (L) 26.0 - 34.0 pg   MCHC 29.1 (L) 30.0 - 36.0 g/dL   RDW 21.2 (H) 11.5 - 15.5 %   Platelets 279 150 - 400 K/uL   nRBC 0.0 0.0 - 0.2 %    Comment: Performed at [facility], [address], [place], [place] [address]  Comprehensive metabolic panel     Status: Abnormal   Collection Time: 10/06/21  2:03 AM  Result Value Ref Range   Sodium 134 (L) 135 - 145 mmol/L   Potassium 3.5 3.5 - 5.1 mmol/L   Chloride 104 98 - 111 mmol/L   CO2 22 22 - 32 mmol/L   Glucose, Bld 112 (H) 70 - 99 mg/dL    Comment: Glucose reference range applies only to samples taken after fasting for at least 8 hours.   BUN 24 (H) 8 - 23 mg/dL   Creatinine, Ser 1.14 (H) 0.44 - 1.00 mg/dL   Calcium 7.4 (L) 8.9 - 10.3 mg/dL   Total Protein 5.5 (L) 6.5 - 8.1 g/dL   Albumin 2.8 (L) 3.5 - 5.0 g/dL   AST 17 15 - 41 U/L   ALT 10 0 - 44 U/L   Alkaline Phosphatase 50 38 - 126 U/L   Total Bilirubin 0.4 0.3 - 1.2 mg/dL   GFR, Estimated 52 (L) >60 mL/min    Comment: (NOTE) Calculated using the CKD-EPI Creatinine Equation (2021)    Anion gap 8 5 - 15    Comment: Performed at [facility], [address] [place], [place] [address]    Chemistries  Recent Labs  Lab 10/05/21 2103 10/06/21 0203  NA 132* 134*  K 3.9 3.5  CL 104 104  CO2 20* 22  GLUCOSE 125* 112*  BUN 27*  24*  CREATININE 1.25* 1.14*  CALCIUM 8.0* 7.4*  AST 18 17  ALT 12 10  ALKPHOS 56 50  BILITOT 0.5 0.4   ------------------------------------------------------------------------------------------------------------------  ------------------------------------------------------------------------------------------------------------------ GFR: Estimated Creatinine Clearance: 42.2 mL/min (A) (by C-G formula based on SCr of 1.14 mg/dL (H)). Liver Function Tests: Recent Labs  Lab 10/05/21 2103 10/06/21 0203  AST 18 17  ALT 12 10  ALKPHOS 56 50  BILITOT 0.5 0.4  PROT 6.3* 5.5*  ALBUMIN 3.1* 2.8*   No results for input(s): LIPASE, AMYLASE in the last 168 hours.  No results for input(s): AMMONIA in the last 168 hours. Coagulation Profile: Recent Labs  Lab 10/05/21 2103  INR 1.3*   Cardiac Enzymes: No results for input(s): CKTOTAL, CKMB, CKMBINDEX, TROPONINI in the last 168 hours. BNP (last 3 results) No results for input(s): PROBNP in the last 8760 hours. HbA1C: No results for input(s): HGBA1C in the last 72 hours. CBG: Recent Labs  Lab 10/05/21 2036 10/05/21 2102  GLUCAP 142* 129*   Lipid Profile: Recent Labs    10/06/21 0203  CHOL 160  HDL 26*  LDLCALC 79  TRIG 273*  CHOLHDL 6.2   Thyroid Function Tests: No results for input(s): TSH, T4TOTAL, FREET4, T3FREE, THYROIDAB in the last 72 hours. Anemia Panel: No results for input(s): VITAMINB12, FOLATE, FERRITIN, TIBC, IRON, RETICCTPCT in the last 72 hours.  --------------------------------------------------------------------------------------------------------------- Urine analysis:    Component Value Date/Time   COLORURINE YELLOW 10/05/2021 2306   APPEARANCEUR CLEAR 10/05/2021 2306   LABSPEC 1.009 10/05/2021 2306   PHURINE 5.0 10/05/2021 2306   GLUCOSEU 50 (A) 10/05/2021 2306   HGBUR NEGATIVE 10/05/2021 2306   BILIRUBINUR NEGATIVE 10/05/2021 2306   KETONESUR NEGATIVE 10/05/2021 2306   PROTEINUR >=300 (A)  10/05/2021 2306   NITRITE NEGATIVE 10/05/2021 2306   LEUKOCYTESUR NEGATIVE 10/05/2021 2306      Imaging Results:    CT HEAD WO CONTRAST  Result Date: 10/05/2021 CLINICAL DATA:  TIA EXAM: CT HEAD WITHOUT CONTRAST TECHNIQUE: Contiguous axial images were obtained from the base of the skull through the vertex without intravenous contrast. COMPARISON:  09/10/2021 FINDINGS: Brain: No acute intracranial abnormality. Specifically, no hemorrhage, hydrocephalus, mass lesion, acute infarction, or significant intracranial injury. Vascular: No hyperdense vessel or unexpected calcification. Skull: No acute calvarial abnormality. Sinuses/Orbits: No acute findings Other: None IMPRESSION: No acute intracranial abnormality. Electronically Signed   By: Rolm Baptise M.D.   On: 10/05/2021 21:35       Assessment & Plan:    Active Problems:   Atrial fibrillation with RVR, hx of PAF previously   Benign essential HTN   Gastroesophageal reflux disease without esophagitis   Depression, recurrent (HCC)   TIA (transient ischemic attack)   TIA CT head showed no acute intracranial abnormality MRI brain today, echo today Patient has an intolerance to statins -so we will not be starting that today PT/OT/ST eval and treat High risk of stroke given paroxysmal atrial fibrillation-continue Eliquis Benign essential hypertension Since patient has returned to baseline spontaneously, no permissive hypertension at this time Continue chlorthalidone, diltiazem,  Hold ARB in the setting of AKI Continue to monitor Paroxysmal atrial fibrillation Continue flecainide Continue Eliquis GERD Continue Protonix AKI Creatinine bumped 0.8-1.25 Unclear etiology as patient reports normal p.o. intake Continue to encourage p.o. intake Trend with morning labs Hold nephrotoxic agents when possible    DVT Prophylaxis-Eliquis- SCDs   AM Labs Ordered, also please review Full Orders  Family Communication: No family at bedside,  discussed calling daughter with patient but she reported not to call as the daughter would already be in bed.  Code Status: DNR  Admission status: Observation Time spent in minutes : [place]

## 2021-10-06 NOTE — ED Notes (Signed)
Attempted report 

## 2021-10-06 NOTE — ED Notes (Signed)
Pt ambulated to bathroom 

## 2021-10-06 NOTE — Plan of Care (Signed)
  Problem: Education: Goal: Knowledge of General Education information will improve Description: Including pain rating scale, medication(s)/side effects and non-pharmacologic comfort measures 10/06/2021 2049 by Santa Lighter, RN Outcome: Progressing 10/06/2021 1832 by Santa Lighter, RN Outcome: Progressing   Problem: Health Behavior/Discharge Planning: Goal: Ability to manage health-related needs will improve 10/06/2021 2049 by Santa Lighter, RN Outcome: Progressing 10/06/2021 1832 by Santa Lighter, RN Outcome: Progressing   Problem: Education: Goal: Knowledge of disease or condition will improve 10/06/2021 2049 by Santa Lighter, RN Outcome: Progressing 10/06/2021 1832 by Santa Lighter, RN Outcome: Progressing Goal: Knowledge of secondary prevention will improve 10/06/2021 2049 by Santa Lighter, RN Outcome: Progressing 10/06/2021 1832 by Santa Lighter, RN Outcome: Progressing   Problem: Coping: Goal: Will verbalize positive feelings about self 10/06/2021 2049 by Santa Lighter, RN Outcome: Progressing 10/06/2021 1832 by Santa Lighter, RN Outcome: Progressing Goal: Will identify appropriate support needs 10/06/2021 2049 by Santa Lighter, RN Outcome: Progressing 10/06/2021 1832 by Santa Lighter, RN Outcome: Progressing   Problem: Ischemic Stroke/TIA Tissue Perfusion: Goal: Complications of ischemic stroke/TIA will be minimized 10/06/2021 2049 by Santa Lighter, RN Outcome: Progressing 10/06/2021 1832 by Santa Lighter, RN Outcome: Progressing

## 2021-10-06 NOTE — Plan of Care (Signed)
  Problem: Education: Goal: Knowledge of General Education information will improve Description: Including pain rating scale, medication(s)/side effects and non-pharmacologic comfort measures Outcome: Progressing   Problem: Health Behavior/Discharge Planning: Goal: Ability to manage health-related needs will improve Outcome: Progressing   Problem: Education: Goal: Knowledge of disease or condition will improve Outcome: Progressing Goal: Knowledge of secondary prevention will improve Outcome: Progressing   Problem: Coping: Goal: Will verbalize positive feelings about self Outcome: Progressing Goal: Will identify appropriate support needs Outcome: Progressing   Problem: Ischemic Stroke/TIA Tissue Perfusion: Goal: Complications of ischemic stroke/TIA will be minimized Outcome: Progressing

## 2021-10-06 NOTE — Progress Notes (Signed)
Occupational Therapy Evaluation Patient Details Name: [name] MRN: [id number] DOB: January 08, 1951 Today's Date: 10/06/2021   History of Present Illness [name]  is a 70 y.o. female, with history of coronary artery disease, carotid disease bilaterally, GERD, hyperlipidemia, hypertension, mitral regurgitation, paroxysmal atrial fibrillation, statin intolerance, stroke, and more presents the ED with a chief complaint of numbness and weakness.  Patient reports that she laid down to take a nap at 1 PM and when she woke up at 5 PM she had bilateral arms and legs numbness and weakness.  She reports she also had blurry vision.  This all happened upon awakening.  She drove like this to her daughter's house.  She reports they went to get food and then to come to the ER.   Clinical Impression   Pt in room upon therapy arrival and agreeable to participate in OT evaluation. Patient is currently performing basic ADL tasks at baseline of modified independent. She does demonstrate some possible mild cognitive deficits related to memory. SLP referral is still pending for them to further assess. Patient's cognitive baseline is unknown. Patient does not require any additional OT services at discharge. Thank you for the referral.       Recommendations for follow up therapy are one component of a multi-disciplinary discharge planning process, led by the attending physician.  Recommendations may be updated based on patient status, additional functional criteria and insurance authorization.   Follow Up Recommendations  No OT follow up    Equipment Recommendations  None recommended by OT       Precautions / Restrictions Precautions Precautions: Other (comment) Precaution Comments: R AKA. Required Braces or Orthoses: Other Brace Other Brace: RLE prosthetic Restrictions Weight Bearing Restrictions: No      Mobility Bed Mobility Overal bed mobility: Independent        Transfers Overall transfer  level: Modified independent Equipment used: Rolling walker (2 wheeled)                  Balance Overall balance assessment: No apparent balance deficits (not formally assessed)           ADL either performed or assessed with clinical judgement   ADL Overall ADL's : At baseline;Modified independent           Vision Baseline Vision/History: 1 Wears glasses Ability to See in Adequate Light: 0 Adequate Patient Visual Report: No change from baseline Vision Assessment?: No apparent visual deficits     Perception Perception Perception Tested?: Yes Perception Deficits: Figure ground Spatial deficits: possible depth perception deficits noted during finger to nose test   Praxis Praxis Praxis tested?: Within functional limits    Pertinent Vitals/Pain Pain Assessment: No/denies pain     Hand Dominance Right   Extremity/Trunk Assessment Upper Extremity Assessment Upper Extremity Assessment: Overall WFL for tasks assessed (Bilateral arthritis in hands with decreased gross grasp.)   Lower Extremity Assessment Lower Extremity Assessment: Defer to PT evaluation       Communication Communication Communication: No difficulties   Cognition Arousal/Alertness: Awake/alert Behavior During Therapy: WFL for tasks assessed/performed Overall Cognitive Status: No family/caregiver present to determine baseline cognitive functioning           General Comments: Had difficulty with word finding. Did not know the date. Month and year were correct. Patient was oriented to situation. Did not know the president. Difficulty remembering two step commands. Able to follow one step commands without difficulty.  Home Living Family/patient expects to be discharged to:: Private residence Living Arrangements: Other relatives (granddaughter) Available Help at Discharge: Family;Available PRN/intermittently Type of Home: House Home Access: Stairs to enter [employer] of Steps: 2 Entrance Stairs-Rails: None Home Layout: Two level;Able to live on main level with bedroom/bathroom Alternate Level Stairs-Number of Steps: 13 Alternate Level Stairs-Rails: Right Bathroom Shower/Tub: Occupational psychologist: Standard     Home Equipment: Environmental consultant - 2 wheels;Wheelchair - manual;Toilet riser;Shower seat          Prior Functioning/Environment Level of Independence: Independent with assistive device(s)                 OT Problem List: Decreased strength      OT Treatment/Interventions:      OT Goals(Current goals can be found in the care plan section) Acute Rehab OT Goals Patient Stated Goal: to go home  OT Frequency:             Co-evaluation PT/OT/SLP Co-Evaluation/Treatment: Yes Reason for Co-Treatment: To address functional/ADL transfers   OT goals addressed during session: ADL's and self-care;Strengthening/ROM      AM-PAC OT "6 Clicks" Daily Activity     Outcome Measure Help from another person eating meals?: None Help from another person taking care of personal grooming?: None Help from another person toileting, which includes using toliet, bedpan, or urinal?: None Help from another person bathing (including washing, rinsing, drying)?: None Help from another person to put on and taking off regular upper body clothing?: None Help from another person to put on and taking off regular lower body clothing?: None 6 Click Score: 24   End of Session Equipment Utilized During Treatment: Rolling walker  Activity Tolerance: Patient tolerated treatment well Patient left: in bed;with call bell/phone within reach  OT Visit Diagnosis: Muscle weakness (generalized) (M62.81)                Time: 1017-5102 OT Time Calculation (min): 16 min Charges:  OT General Charges $OT Visit: 1 Visit OT Evaluation $OT Eval Low Complexity: 1 Low  Ailene Ravel, OTR/L,CBIS  [phone]   Shonika Kolasinski, Clarene Duke 10/06/2021,  10:00 AM

## 2021-10-07 DIAGNOSIS — I639 Cerebral infarction, unspecified: Secondary | ICD-10-CM

## 2021-10-07 DIAGNOSIS — I10 Essential (primary) hypertension: Secondary | ICD-10-CM | POA: Diagnosis not present

## 2021-10-07 DIAGNOSIS — I480 Paroxysmal atrial fibrillation: Secondary | ICD-10-CM | POA: Diagnosis not present

## 2021-10-07 LAB — BASIC METABOLIC PANEL
Anion gap: 7 (ref 5–15)
BUN: 18 mg/dL (ref 8–23)
CO2: 21 mmol/L — ABNORMAL LOW (ref 22–32)
Calcium: 8 mg/dL — ABNORMAL LOW (ref 8.9–10.3)
Chloride: 111 mmol/L (ref 98–111)
Creatinine, Ser: 0.91 mg/dL (ref 0.44–1.00)
GFR, Estimated: >60 mL/min (ref >60)
Glucose, Bld: 85 mg/dL (ref 70–99)
Potassium: 4.1 mmol/L (ref 3.5–5.1)
Sodium: 139 mmol/L (ref 135–145)

## 2021-10-07 NOTE — Care Management Obs Status (Signed)
[place] NOTIFICATION   Patient Details  Name: [name] MRN: [id number] Date of Birth: 10-24-1951   Medicare Observation Status Notification Given:  Yes    [name] 10/07/2021, 11:28 AM

## 2021-10-07 NOTE — Care Management Obs Status (Signed)
[place] NOTIFICATION   Patient Details  Name: [name] MRN: [id number] Date of Birth: Jul 02, 1951   Medicare Observation Status Notification Given:  Yes    [name] 10/07/2021, 11:28 AM

## 2021-10-07 NOTE — Progress Notes (Signed)
Patient ID: [name], female   DOB: 30-Nov-1951, 70 y.o.   MRN: [id number]  PROGRESS NOTE    [name]  MBW:[id number] DOB: 07-Jun-1951 DOA: 10/05/2021 PCP: Ivy Lynn, NP   Brief Narrative:   70 y.o. female, with history of coronary artery disease, carotid disease bilaterally, GERD, hyperlipidemia, hypertension, mitral regurgitation, paroxysmal atrial fibrillation, statin intolerance, stroke presented with numbness and weakness.  On presentation, CT head was negative for acute intracranial abnormality.  MRI of brain showed tiny acute to subacute infarct in the right centrum semiovale.  Neurology was consulted.  Assessment & Plan:   Acute unspecified CVA -Presented with numbness and weakness.  CT of the head was negative for acute intracranial abnormality. -MRI of brain showed tiny acute to subacute infarct in the right centrum semiovale. -Echo showed EF of 70 to 75% with findings suggestive of HOCM: Suggested routine outpatient cardiac MRI.  Communicated with Dr. McDowell/cardiology via secure chat who recommended that this can be obtained as an outpatient when patient follows up with her regular cardiologist/Dr. [name] -Follow neurology recommendations. -Diet as per SLP recommendations.  Will need home health SLP.  Patient tolerating PT. -Continue Eliquis along with aspirin for now.  Intolerant to statin.  Hypertension -Monitor blood pressure.  Allow for permissive hypertension.  Hold diltiazem and chlorthalidone  Acute kidney injury  -resolved.  Creatinine 0.91 today.  Treated with IV fluids.  Paroxysmal A. fib -Continue flecainide and Eliquis.  Outpatient follow-up with cardiology  Hyperlipidemia -Continue ezetimibe and fenofibrate  GERD -Continue Protonix  DVT prophylaxis: Eliquis Code Status: Full Family Communication: None at bedside Disposition Plan: Status is: Observation  The patient will require care spanning > 2 midnights and should be moved to inpatient  because: Patient has an acute stroke and is waiting neurology recommendations.   Consultants: Neurology  Procedures: 2D echo  Antimicrobials: None   Subjective: Patient seen and examined at bedside.  Poor historian.  No overnight fever, vomiting or chest pain reported.  Objective: Vitals:   10/06/21 1839 10/07/21 0018 10/07/21 0316 10/07/21 0649  BP: (!) 139/97 (!) 164/61 140/76 138/74  Pulse: 76 73 67 67  Resp: 20 19 19 19   Temp: 98.7 F (37.1 C) 98.3 F (36.8 C) 98 F (36.7 C)   TempSrc: Oral Oral Oral   SpO2: 99% 98% 97% 96%  Weight:      Height:        Intake/Output Summary (Last 24 hours) at 10/07/2021 1119 Last data filed at 10/07/2021 0900 Gross per 24 hour  Intake 100 ml  Output --  Net 100 ml   Filed Weights   10/05/21 2029  Weight: 61.7 kg    Examination:  General exam: Appears calm and comfortable.  Currently on room air. Respiratory system: Bilateral decreased breath sounds at bases Cardiovascular system: S1 & S2 heard, Rate controlled Gastrointestinal system: Abdomen is nondistended, soft and nontender. Normal bowel sounds heard. Extremities: No cyanosis, clubbing, edema  Central nervous system: Sleepy, wakes up slightly, does not participate in conversation much.  No focal neurological deficits. Moving extremities Skin: No rashes, lesions or ulcers Psychiatry: Affect is flat when she wakes up.   Data Reviewed: I have personally reviewed following labs and imaging studies  CBC: Recent Labs  Lab 10/05/21 2103 10/06/21 0203  WBC 18.1* 12.2*  NEUTROABS 15.3*  --   HGB 12.9 11.0*  HCT 44.9 37.8  MCV 80.2 81.8  PLT 358 017   Basic Metabolic Panel: Recent Labs  Lab  10/05/21 2103 10/06/21 0203 10/07/21 0626  NA 132* 134* 139  K 3.9 3.5 4.1  CL 104 104 111  CO2 20* 22 21*  GLUCOSE 125* 112* 85  BUN 27* 24* 18  CREATININE 1.25* 1.14* 0.91  CALCIUM 8.0* 7.4* 8.0*   GFR: Estimated Creatinine Clearance: 52.9 mL/min (by C-G formula  based on SCr of 0.91 mg/dL). Liver Function Tests: Recent Labs  Lab 10/05/21 2103 10/06/21 0203  AST 18 17  ALT 12 10  ALKPHOS 56 50  BILITOT 0.5 0.4  PROT 6.3* 5.5*  ALBUMIN 3.1* 2.8*   No results for input(s): LIPASE, AMYLASE in the last 168 hours. No results for input(s): AMMONIA in the last 168 hours. Coagulation Profile: Recent Labs  Lab 10/05/21 2103  INR 1.3*   Cardiac Enzymes: No results for input(s): CKTOTAL, CKMB, CKMBINDEX, TROPONINI in the last 168 hours. BNP (last 3 results) No results for input(s): PROBNP in the last 8760 hours. HbA1C: Recent Labs    10/06/21 0203  HGBA1C 4.8   CBG: Recent Labs  Lab 10/05/21 2036 10/05/21 2102  GLUCAP 142* 129*   Lipid Profile: Recent Labs    10/06/21 0203  CHOL 160  HDL 26*  LDLCALC 79  TRIG 273*  CHOLHDL 6.2   Thyroid Function Tests: No results for input(s): TSH, T4TOTAL, FREET4, T3FREE, THYROIDAB in the last 72 hours. Anemia Panel: No results for input(s): VITAMINB12, FOLATE, FERRITIN, TIBC, IRON, RETICCTPCT in the last 72 hours. Sepsis Labs: No results for input(s): PROCALCITON, LATICACIDVEN in the last 168 hours.  Recent Results (from the past 240 hour(s))  Resp Panel by RT-PCR (Flu A&B, Covid) Nasopharyngeal Swab     Status: None   Collection Time: 10/05/21  9:24 PM   Specimen: Nasopharyngeal Swab; Nasopharyngeal(NP) swabs in vial transport medium  Result Value Ref Range Status   SARS Coronavirus 2 by RT PCR NEGATIVE NEGATIVE Final    Comment: (NOTE) SARS-CoV-2 target nucleic acids are NOT DETECTED.  The SARS-CoV-2 RNA is generally detectable in upper respiratory specimens during the acute phase of infection. The lowest concentration of SARS-CoV-2 viral copies this assay can detect is 138 copies/mL. A negative result does not preclude SARS-Cov-2 infection and should not be used as the sole basis for treatment or other patient management decisions. A negative result may occur with  improper  specimen collection/handling, submission of specimen other than nasopharyngeal swab, presence of viral mutation(s) within the areas targeted by this assay, and inadequate number of viral copies(<138 copies/mL). A negative result must be combined with clinical observations, patient history, and epidemiological information. The expected result is Negative.  Fact Sheet for Patients:  EntrepreneurPulse.com.au  Fact Sheet for Healthcare Providers:  IncredibleEmployment.be  This test is no t yet approved or cleared by the [place] FDA and  has been authorized for detection and/or diagnosis of SARS-CoV-2 by FDA under an Emergency Use Authorization (EUA). This EUA will remain  in effect (meaning this test can be used) for the duration of the COVID-19 declaration under Section 564(b)(1) of the Act, 21 U.S.C.section 360bbb-3(b)(1), unless the authorization is terminated  or revoked sooner.       Influenza A by PCR NEGATIVE NEGATIVE Final   Influenza B by PCR NEGATIVE NEGATIVE Final    Comment: (NOTE) The Xpert Xpress SARS-CoV-2/FLU/RSV plus assay is intended as an aid in the diagnosis of influenza from Nasopharyngeal swab specimens and should not be used as a sole basis for treatment. Nasal washings and aspirates are unacceptable for Xpert  Xpress SARS-CoV-2/FLU/RSV testing.  Fact Sheet for Patients: EntrepreneurPulse.com.au  Fact Sheet for Healthcare Providers: IncredibleEmployment.be  This test is not yet approved or cleared by the [place] FDA and has been authorized for detection and/or diagnosis of SARS-CoV-2 by FDA under an Emergency Use Authorization (EUA). This EUA will remain in effect (meaning this test can be used) for the duration of the COVID-19 declaration under Section 564(b)(1) of the Act, 21 U.S.C. section 360bbb-3(b)(1), unless the authorization is terminated or revoked.  Performed at  [facility], [address], [place], [place] [address]          Radiology Studies: CT HEAD WO CONTRAST  Result Date: 10/05/2021 CLINICAL DATA:  TIA EXAM: CT HEAD WITHOUT CONTRAST TECHNIQUE: Contiguous axial images were obtained from the base of the skull through the vertex without intravenous contrast. COMPARISON:  09/10/2021 FINDINGS: Brain: No acute intracranial abnormality. Specifically, no hemorrhage, hydrocephalus, mass lesion, acute infarction, or significant intracranial injury. Vascular: No hyperdense vessel or unexpected calcification. Skull: No acute calvarial abnormality. Sinuses/Orbits: No acute findings Other: None IMPRESSION: No acute intracranial abnormality. Electronically Signed   By: Rolm Baptise M.D.   On: 10/05/2021 21:35   MR ANGIO HEAD WO CONTRAST  Result Date: 10/06/2021 CLINICAL DATA:  TIA, altered mental status, weakness EXAM: MRI HEAD WITHOUT CONTRAST MRA HEAD WITHOUT CONTRAST TECHNIQUE: Multiplanar, multi-echo pulse sequences of the brain and surrounding structures were acquired without intravenous contrast. Angiographic images of the Circle of Willis were acquired using MRA technique without intravenous contrast. COMPARISON:  Noncontrast CT head obtained 1 day prior FINDINGS: MRI HEAD FINDINGS Brain: There is a tiny focus of faint diffusion restriction in the right centrum semiovale which could reflect a tiny acute to subacute infarct. There is no other evidence of acute infarct. There is no acute intracranial hemorrhage or extra-axial fluid collection. There is unchanged global parenchymal volume loss. Confluent FLAIR signal abnormality throughout the subcortical and periventricular white matter likely reflects sequela of moderate chronic white matter microangiopathy. There are small remote lacunar infarcts in the right caudate head and basal ganglia. There is no mass lesion.  There is no midline shift. Vascular: The major flow voids are present. The vasculature is  better assessed below. Skull and upper cervical spine: Normal marrow signal. Sinuses/Orbits: The imaged paranasal sinuses are clear. Bilateral lens implants are in place. The globes and orbits are otherwise unremarkable. Other: None. MRA HEAD FINDINGS Anterior circulation: There is mild irregularity of the intracranial ICAs likely reflecting atherosclerotic disease. There is no high-grade stenosis or occlusion. The bilateral MCAs are patent. The left A1 segment is diminutive, likely a normal variant. There is focal moderate stenosis of the left A2 segment (10-148). The bilateral ACAs are otherwise patent. There is multifocal irregularity of the right A2 segment with up to severe stenosis (267-124). There is no aneurysm. Posterior circulation: There is minimal flow related enhancement in the proximal left V4 segment. The distal left V4 segment is patent. The right V4 segment is patent with mild multifocal irregularity and narrowing. The basilar artery is patent with mild multifocal irregularity and narrowing. There is focal stenosis at the right P1/P2 junction, though the right PCA is supplied by a prominent right posterior communicating artery. The bilateral PCAs are otherwise patent with mild multifocal irregularity and narrowing throughout. There is no aneurysm. Anatomic variants: As above. IMPRESSION: 1. Tiny acute to subacute infarct in the right centrum semiovale. No other acute infarct or other acute intracranial pathology. 2. Mild global parenchymal volume loss  and moderate chronic white matter microangiopathy. 3. Multifocal irregularity and narrowing of the right A2 segment with up to severe stenosis, and moderate focal stenosis of the left A2 segment. 4. Diminutive flow related enhancement in the proximal left V4 segment. This may reflect high-grade stenosis or occlusion due to atherosclerotic disease with possible retrograde flow via the right V4 segment. CTA head could be obtained for further evaluation.  5. Focal stenosis at the right P1/P2 junction, though the right PCA is primarily supplied by a prominent posterior communicating artery. 6. Otherwise, mild irregularity of the remainder of the intracranial vasculature as detailed above. Electronically Signed   By: Valetta Mole M.D.   On: 10/06/2021 10:17   MR BRAIN WO CONTRAST  Result Date: 10/06/2021 CLINICAL DATA:  TIA, altered mental status, weakness EXAM: MRI HEAD WITHOUT CONTRAST MRA HEAD WITHOUT CONTRAST TECHNIQUE: Multiplanar, multi-echo pulse sequences of the brain and surrounding structures were acquired without intravenous contrast. Angiographic images of the Circle of Willis were acquired using MRA technique without intravenous contrast. COMPARISON:  Noncontrast CT head obtained 1 day prior FINDINGS: MRI HEAD FINDINGS Brain: There is a tiny focus of faint diffusion restriction in the right centrum semiovale which could reflect a tiny acute to subacute infarct. There is no other evidence of acute infarct. There is no acute intracranial hemorrhage or extra-axial fluid collection. There is unchanged global parenchymal volume loss. Confluent FLAIR signal abnormality throughout the subcortical and periventricular white matter likely reflects sequela of moderate chronic white matter microangiopathy. There are small remote lacunar infarcts in the right caudate head and basal ganglia. There is no mass lesion.  There is no midline shift. Vascular: The major flow voids are present. The vasculature is better assessed below. Skull and upper cervical spine: Normal marrow signal. Sinuses/Orbits: The imaged paranasal sinuses are clear. Bilateral lens implants are in place. The globes and orbits are otherwise unremarkable. Other: None. MRA HEAD FINDINGS Anterior circulation: There is mild irregularity of the intracranial ICAs likely reflecting atherosclerotic disease. There is no high-grade stenosis or occlusion. The bilateral MCAs are patent. The left A1 segment is  diminutive, likely a normal variant. There is focal moderate stenosis of the left A2 segment (10-148). The bilateral ACAs are otherwise patent. There is multifocal irregularity of the right A2 segment with up to severe stenosis (812-751). There is no aneurysm. Posterior circulation: There is minimal flow related enhancement in the proximal left V4 segment. The distal left V4 segment is patent. The right V4 segment is patent with mild multifocal irregularity and narrowing. The basilar artery is patent with mild multifocal irregularity and narrowing. There is focal stenosis at the right P1/P2 junction, though the right PCA is supplied by a prominent right posterior communicating artery. The bilateral PCAs are otherwise patent with mild multifocal irregularity and narrowing throughout. There is no aneurysm. Anatomic variants: As above. IMPRESSION: 1. Tiny acute to subacute infarct in the right centrum semiovale. No other acute infarct or other acute intracranial pathology. 2. Mild global parenchymal volume loss and moderate chronic white matter microangiopathy. 3. Multifocal irregularity and narrowing of the right A2 segment with up to severe stenosis, and moderate focal stenosis of the left A2 segment. 4. Diminutive flow related enhancement in the proximal left V4 segment. This may reflect high-grade stenosis or occlusion due to atherosclerotic disease with possible retrograde flow via the right V4 segment. CTA head could be obtained for further evaluation. 5. Focal stenosis at the right P1/P2 junction, though the right PCA is primarily  supplied by a prominent posterior communicating artery. 6. Otherwise, mild irregularity of the remainder of the intracranial vasculature as detailed above. Electronically Signed   By: Valetta Mole M.D.   On: 10/06/2021 10:17   ECHOCARDIOGRAM COMPLETE  Result Date: 10/06/2021    ECHOCARDIOGRAM REPORT   Patient Name:   [name] Date of Exam: 10/06/2021 Medical Rec #:   [id number]      Height:       65.5 in Accession #:    [id number]     Weight:       136.0 lb Date of Birth:  06/07/1951       BSA:          1.689 m Patient Age:    61 years       BP:           104/86 mmHg Patient Gender: F              HR:           78 bpm. Exam Location:  [facility] Procedure: 2D Echo, Cardiac Doppler and Color Doppler Indications:    TIA  History:        Patient has prior history of Echocardiogram examinations, most                 recent 05/18/2013. CAD and Previous Myocardial Infarction, PAD                 and TIA, Arrythmias:Atrial Fibrillation; Risk                 Factors:Hypertension and Dyslipidemia.  Sonographer:    Wenda Low Referring Phys: [id number] ASIA B Roca  1. There is severe asymmetric hypertrophy of the basal septum (up to 18 mm). There is chordal SAM but no SAM of the mitral valve. There is a mid cavitary gradient up to 15-17 mmHG. No signs of obstruction. Findings possibly represent hypertrophic cardiomyopathy. Would recommend a non-urgent cardiac MRI for clarification. Left ventricular ejection fraction, by estimation, is 70 to 75%. The left ventricle has hyperdynamic function. The left ventricle has no regional wall motion abnormalities. There  is severe asymmetric left ventricular hypertrophy of the basal-septal segment. Left ventricular diastolic parameters are consistent with Grade I diastolic dysfunction (impaired relaxation).  2. Right ventricular systolic function is normal. The right ventricular size is normal. Tricuspid regurgitation signal is inadequate for assessing PA pressure.  3. The mitral valve is degenerative. Trivial mitral valve regurgitation. No evidence of mitral stenosis.  4. The aortic valve is tricuspid. There is mild calcification of the aortic valve. Aortic valve regurgitation is not visualized. Mild aortic valve sclerosis is present, with no evidence of aortic valve stenosis.  5. The inferior vena cava is normal in size with  greater than 50% respiratory variability, suggesting right atrial pressure of 3 mmHg. Conclusion(s)/Recommendation(s): No intracardiac source of embolism detected on this transthoracic study. A transesophageal echocardiogram is recommended to exclude cardiac source of embolism if clinically indicated. FINDINGS  Left Ventricle: There is severe asymmetric hypertrophy of the basal septum (up to 18 mm). There is chordal SAM but no SAM of the mitral valve. There is a mid cavitary gradient up to 15-17 mmHG. No signs of obstruction. Findings possibly represent hypertrophic cardiomyopathy. Would recommend a non-urgent cardiac MRI for clarification. Left ventricular ejection fraction, by estimation, is 70 to 75%. The left ventricle has hyperdynamic function. The left ventricle has no regional wall motion abnormalities. The left ventricular internal  cavity size was small. There is severe asymmetric left ventricular hypertrophy of the basal-septal segment. Left ventricular diastolic parameters are consistent with Grade I diastolic dysfunction (impaired relaxation). Right Ventricle: The right ventricular size is normal. No increase in right ventricular wall thickness. Right ventricular systolic function is normal. Tricuspid regurgitation signal is inadequate for assessing PA pressure. Left Atrium: Left atrial size was normal in size. Right Atrium: Right atrial size was normal in size. Pericardium: There is no evidence of pericardial effusion. Presence of pericardial fat pad. Mitral Valve: The mitral valve is degenerative in appearance. Trivial mitral valve regurgitation. No evidence of mitral valve stenosis. MV peak gradient, 14.1 mmHg. The mean mitral valve gradient is 6.0 mmHg. Tricuspid Valve: The tricuspid valve is grossly normal. Tricuspid valve regurgitation is trivial. No evidence of tricuspid stenosis. Aortic Valve: The aortic valve is tricuspid. There is mild calcification of the aortic valve. Aortic valve  regurgitation is not visualized. Mild aortic valve sclerosis is present, with no evidence of aortic valve stenosis. Aortic valve mean gradient measures 6.5 mmHg. Aortic valve peak gradient measures 13.2 mmHg. Aortic valve area, by VTI measures 3.12 cm. Pulmonic Valve: The pulmonic valve was grossly normal. Pulmonic valve regurgitation is trivial. No evidence of pulmonic stenosis. Aorta: The aortic root is normal in size and structure. Venous: The inferior vena cava is normal in size with greater than 50% respiratory variability, suggesting right atrial pressure of 3 mmHg. IAS/Shunts: The atrial septum is grossly normal.  LEFT VENTRICLE PLAX 2D LVIDd:         3.50 cm   Diastology LVIDs:         2.30 cm   LV e' medial:    5.00 cm/s LV PW:         1.30 cm   LV E/e' medial:  18.2 LV IVS:        1.80 cm   LV e' lateral:   7.07 cm/s LVOT diam:     2.00 cm   LV E/e' lateral: 12.8 LV SV:         99 LV SV Index:   58 LVOT Area:     3.14 cm  RIGHT VENTRICLE RV Basal diam:  2.80 cm RV Mid diam:    2.90 cm RV S prime:     16.80 cm/s TAPSE (M-mode): 2.2 cm LEFT ATRIUM             Index        RIGHT ATRIUM           Index LA diam:        3.70 cm 2.19 cm/m   RA Area:     14.00 cm LA Vol (A2C):   45.1 ml 26.71 ml/m  RA Volume:   30.20 ml  17.88 ml/m LA Vol (A4C):   46.1 ml 27.30 ml/m LA Biplane Vol: 45.6 ml 27.00 ml/m  AORTIC VALVE                     PULMONIC VALVE AV Area (Vmax):    3.09 cm      PV Vmax:       1.02 m/s AV Area (Vmean):   3.40 cm      PV Peak grad:  4.2 mmHg AV Area (VTI):     3.12 cm AV Vmax:           182.00 cm/s AV Vmean:          110.000 cm/s AV VTI:  0.316 m AV Peak Grad:      13.2 mmHg AV Mean Grad:      6.5 mmHg LVOT Vmax:         179.00 cm/s LVOT Vmean:        119.000 cm/s LVOT VTI:          0.314 m LVOT/AV VTI ratio: 0.99  AORTA Ao Root diam: 2.80 cm MITRAL VALVE MV Area (PHT): 3.08 cm     SHUNTS MV Area VTI:   2.49 cm     Systemic VTI:  0.31 m MV Peak grad:  14.1 mmHg    Systemic  Diam: 2.00 cm MV Mean grad:  6.0 mmHg MV Vmax:       1.88 m/s MV Vmean:      108.0 cm/s MV Decel Time: 246 msec MV E velocity: 90.80 cm/s MV A velocity: 137.00 cm/s MV E/A ratio:  0.66 Eleonore Chiquito MD Electronically signed by Eleonore Chiquito MD Signature Date/Time: 10/06/2021/4:20:52 PM    Final         Scheduled Meds:  apixaban  5 mg Oral BID   aspirin EC  325 mg Oral Daily   escitalopram  20 mg Oral Daily   ezetimibe  10 mg Oral Daily   fenofibrate  54 mg Oral Daily   flecainide  50 mg Oral BID   pantoprazole  40 mg Oral Daily   Continuous Infusions:        Aline August, MD [facility] 10/07/2021, 11:19 AM

## 2021-10-07 NOTE — Consult Note (Addendum)
  Cardiology Consultation:   Patient ID: [name] MRN: [id number]; DOB: 02/10/1951  Admit date: 10/05/2021 Date of Consult: 10/07/2021  PCP:  Stacey Lynn, NP   [name] Providers Cardiologist:  Stacey Burow, MD        Patient Profile:   [name] is a 70 y.o. female with a hx of PAD (s/p multiple iliac and SFA interventions with ultimate R AKA in 05/2017), paroxysmal atrial fibrillation (on Eliquis and Flecainide), CAD (nonobstructive CAD by cath in 2014), HTN and HLD who is being seen 10/07/2021 for the evaluation of hypertrophic cardiomyopathy at the request of Dr. Starla [name]  History of Present Illness:   [name] presented to [facility] ED during the early morning hours of 10/05/2021 for evaluation of slurred speech and left facial droop. Did have weakness along her arms and legs bilaterally. While she was in the ED, her symptoms started to resolve on their own and she did not receive tPA.  Initial labs showed WBC 18.1, Hgb 12.9, platelets 358, Na+ 132, K+ 3.9 and creatinine 1.25. COVID-negative. UDS negative. Hgb A1c 4.8. FLP showing total cholesterol 160, triglycerides 273, HDL 26 and LDL 79. EKG showed NSR, HR 79 with TWI along Leads I and AVL which is now more prominent when compared to prior tracings. CT Head showed no acute intracranial abnormalities. Brain MRI showed a tiny acute to subacute infarct in the right centrum semiovale. Was also noted to have moderate chronic white matter microangiopathy and multifocal irregularity and narrowing of the right A2 segment and moderate focal stenosis of the left A2 segment.   An echo was obtained as part of her work-up and showed hyperdynamic LV function with EF of 70 to 75%. Was noted to have severe asymmetric hypertrophy of the basal septum and mid cavitary gradient of 15 to 17 mmHg but no signs of obstruction.  Findings were felt to be consistent with hypertrophic cardiomyopathy and a nonemergent Cardiac MRI was  recommended for further evaluation.  Assessment and Plan:   I had already started seeing this patient in consult when we were informed the consult had been canceled as she could have outpatient work-up. She will continue with her PTA cardiac medication regimen and she reports she was compliant with her medications as an outpatient. We discussed following BP as an outpatient as this has been elevated at home and her PCP recommended she follow-up with Cardiology. Will therefore arrange a follow-up visit for follow-up of her BP and can further discuss a cMRI at that time.    Signed, Erma Heritage, PA-C  10/07/2021 11:21 AM   Attending note:  Discussed with [name] PA-C and also Dr. Starla [name]  Outpatient cardiac work-up planned in this case.  There is question of possible HOCM by echocardiography with recommendation to consider nonurgent outpatient cardiac MRI for clarification.  Patient already has known history of paroxysmal atrial fibrillation and is on flecainide and Eliquis.  Presents now with stroke.  Medications reviewed, would continue aspirin along with Eliquis.  Not entirely clear that TEE would change clinical management at this time since she is already anticoagulated.  Cardiac MRI would be most useful overall.  [name], M.D., F.A.C.C.

## 2021-10-07 NOTE — Consult Note (Signed)
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 112 (H) 70 - 99 mg/dL    Comment: Glucose reference range applies only to samples taken after fasting for at least 8 hours.   BUN 24 (H) 8 - 23 mg/dL  Creatinine, Ser 1.14 (H) 0.44 - 1.00 mg/dL   Calcium 7.4 (L) 8.9 - 10.3 mg/dL   Total Protein 5.5 (L) 6.5 - 8.1 g/dL   Albumin 2.8 (L) 3.5 - 5.0 g/dL   AST 17 15 - 41 U/L   ALT 10 0 - 44 U/L   Alkaline Phosphatase 50 38 - 126 U/L   Total Bilirubin 0.4 0.3 - 1.2 mg/dL   GFR, Estimated 52 (L) >60 mL/min    Comment: (NOTE) Calculated using the CKD-EPI  Creatinine Equation (2021)    Anion gap 8 5 - 15    Comment: Performed at [facility], [address], [place], [place] [address]  Basic metabolic panel     Status: Abnormal   Collection Time: 10/07/21  6:26 AM  Result Value Ref Range   Sodium 139 135 - 145 mmol/L   Potassium 4.1 3.5 - 5.1 mmol/L   Chloride 111 98 - 111 mmol/L   CO2 21 (L) 22 - 32 mmol/L   Glucose, Bld 85 70 - 99 mg/dL    Comment: Glucose reference range applies only to samples taken after fasting for at least 8 hours.   BUN 18 8 - 23 mg/dL   Creatinine, Ser 0.91 0.44 - 1.00 mg/dL   Calcium 8.0 (L) 8.9 - 10.3 mg/dL   GFR, Estimated >60 >60 mL/min    Comment: (NOTE) Calculated using the CKD-EPI Creatinine Equation (2021)    Anion gap 7 5 - 15    Comment: Performed at [facility], [address] [place], [place] [address]    Studies/Results:  TTE 1. There is severe asymmetric hypertrophy of the basal septum (up to 18  mm). There is chordal SAM but no SAM of the mitral valve. There is a mid  cavitary gradient up to 15-17 mmHG. No signs of obstruction. 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 Would recommend a non-urgent cardiac MRI for  clarification. Left ventricular ejection fraction, by estimation, is 70 to  75%. The left ventricle has hyperdynamic function. The left ventricle has  no regional wall motion abnormalities. There   is severe asymmetric left ventricular hypertrophy of the basal-septal  segment. Left ventricular diastolic parameters are consistent with Grade I  diastolic dysfunction (impaired relaxation).   2. Right ventricular systolic function is normal. The right ventricular  size is normal. Tricuspid regurgitation signal is inadequate for assessing  PA pressure.   3. The mitral valve is degenerative. Trivial mitral valve regurgitation.  No evidence of mitral stenosis.   4. The aortic valve is tricuspid. There is mild calcification of the  aortic valve. Aortic valve  regurgitation is not visualized. Mild aortic  valve sclerosis is present, with no evidence of aortic valve stenosis.   5. The inferior vena cava is normal in size with greater than 50%  respiratory variability, suggesting right atrial pressure of 3 mmHg.   Conclusion(s)/Recommendation(s): No intracardiac source of embolism  detected on this transthoracic study. A transesophageal echocardiogram is  recommended to exclude cardiac source of embolism if clinically indicated.         BRAIN MRI MRA FINDINGS: MRI HEAD FINDINGS   Brain: There is a tiny focus of faint diffusion restriction in the right centrum semiovale which could reflect a tiny acute to subacute infarct. There is no other evidence of acute infarct. There is no acute intracranial hemorrhage or extra-axial fluid collection.   There is unchanged global parenchymal volume loss. Confluent FLAIR signal abnormality throughout the subcortical and periventricular white  matter likely reflects sequela of moderate chronic white matter microangiopathy. There are small remote lacunar infarcts in the right caudate head and basal ganglia.   There is no mass lesion.  There is no midline shift.   Vascular: The major flow voids are present. The vasculature is better assessed below.   Skull and upper cervical spine: Normal marrow signal.   Sinuses/Orbits: The imaged paranasal sinuses are clear. Bilateral lens implants are in place. The globes and orbits are otherwise unremarkable.   Other: None.   MRA HEAD FINDINGS   Anterior circulation: There is mild irregularity of the intracranial ICAs likely reflecting atherosclerotic disease. There is no high-grade stenosis or occlusion.   The bilateral MCAs are patent.   The left A1 segment is diminutive, likely a normal variant. There is focal moderate stenosis of the left A2 segment (10-148). The bilateral ACAs are otherwise patent. There is multifocal irregularity of the right A2  segment with up to severe stenosis (093-818).   There is no aneurysm.   Posterior circulation: There is minimal flow related enhancement in the proximal left V4 segment. The distal left V4 segment is patent. The right V4 segment is patent with mild multifocal irregularity and narrowing. The basilar artery is patent with mild multifocal irregularity and narrowing.   There is focal stenosis at the right P1/P2 junction, though the right PCA is supplied by a prominent right posterior communicating artery. The bilateral PCAs are otherwise patent with mild multifocal irregularity and narrowing throughout.   There is no aneurysm.   Anatomic variants: As above.   IMPRESSION: 1. Tiny acute to subacute infarct in the right centrum semiovale. No other acute infarct or other acute intracranial pathology. 2. Mild global parenchymal volume loss and moderate chronic white matter microangiopathy. 3. Multifocal irregularity and narrowing of the right A2 segment with up to severe stenosis, and moderate focal stenosis of the left A2 segment. 4. Diminutive flow related enhancement in the proximal left V4 segment. This may reflect high-grade stenosis or occlusion due to atherosclerotic disease with possible retrograde flow via the right V4 segment. CTA head could be obtained for further evaluation. 5. Focal stenosis at the right P1/P2 junction, though the right PCA is primarily supplied by a prominent posterior communicating artery. 6. Otherwise, mild irregularity of the remainder of the intracranial vasculature as detailed above.        The brain MRI is reviewed and shows a tiny acute/ subacute increased signal involving right frontal deep white matter. There is moderate confluent periventricular leukoencephalopathy consistent with chronic microvascular changes. There is mild global atrophy. No hemorrhages noted.   Raider Valbuena A. Merlene Laughter, M.D.  Diplomate, Tax adviser of Psychiatry and  Neurology ( Neurology). 10/07/2021, 5:58 PM

## 2021-10-08 ENCOUNTER — Observation Stay (HOSPITAL_COMMUNITY): Payer: 59

## 2021-10-08 DIAGNOSIS — I639 Cerebral infarction, unspecified: Secondary | ICD-10-CM | POA: Diagnosis not present

## 2021-10-08 DIAGNOSIS — N179 Acute kidney failure, unspecified: Secondary | ICD-10-CM

## 2021-10-08 DIAGNOSIS — I6523 Occlusion and stenosis of bilateral carotid arteries: Secondary | ICD-10-CM | POA: Diagnosis not present

## 2021-10-08 IMAGING — US US CAROTID DUPLEX BILAT
1 series · 13 of 24 positions shown · non-contrast
Comparison: None.

CLINICAL DATA: Acute ischemic stroke.

EXAM:
BILATERAL CAROTID DUPLEX ULTRASOUND
TECHNIQUE: Gray scale imaging, color Doppler and duplex ultrasound were
performed of bilateral carotid and vertebral arteries in the neck.

[Series 1: us carotid bilateral · 13 of 69 slices shown]
[im 1/69]
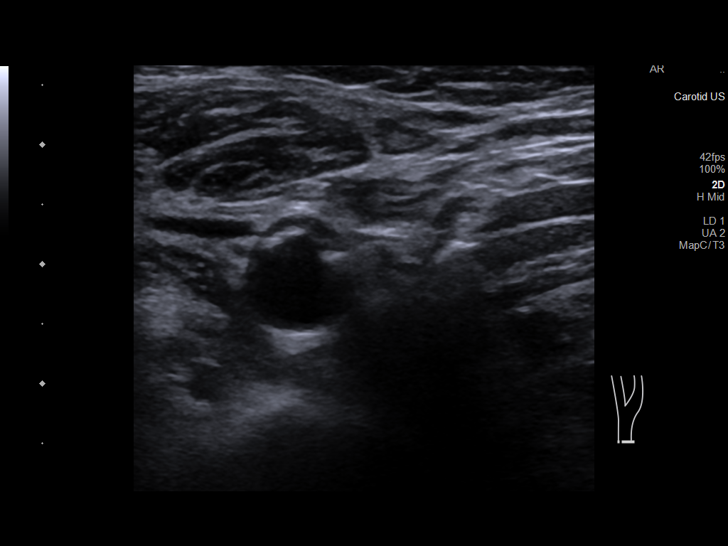
[im 6/69]
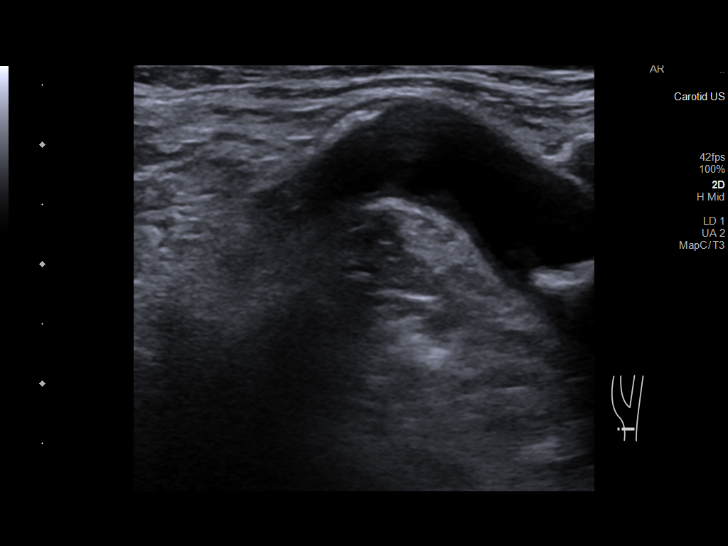
[im 12/69]
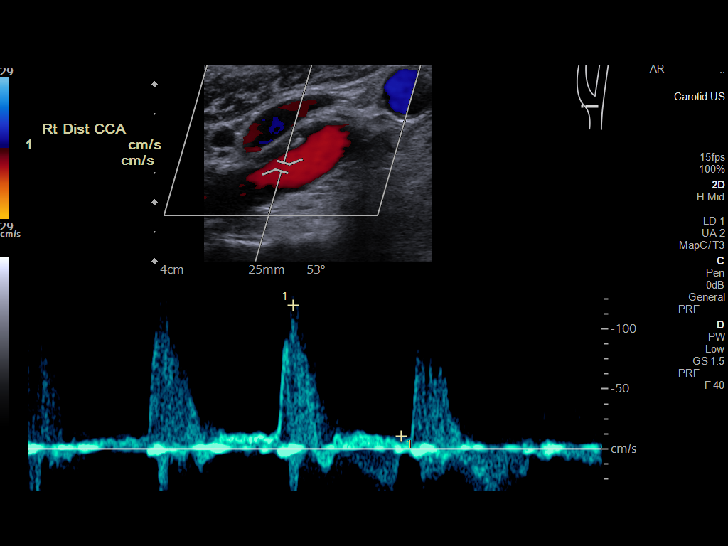
[im 18/69]
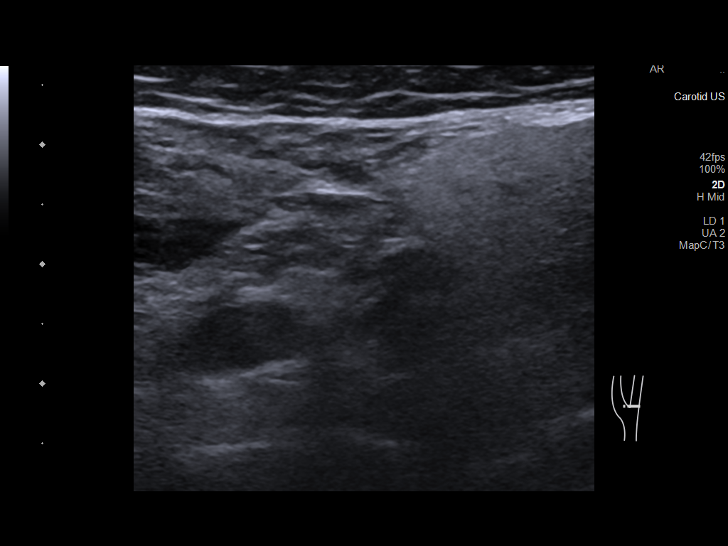
[im 24/69]
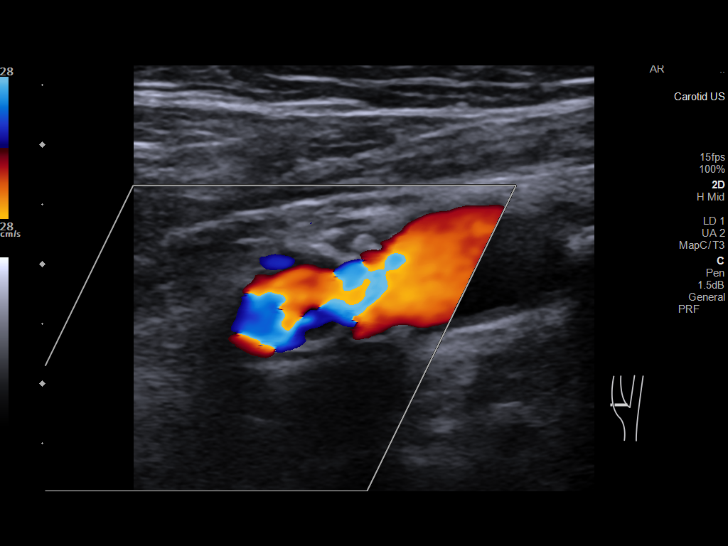
[im 30/69]
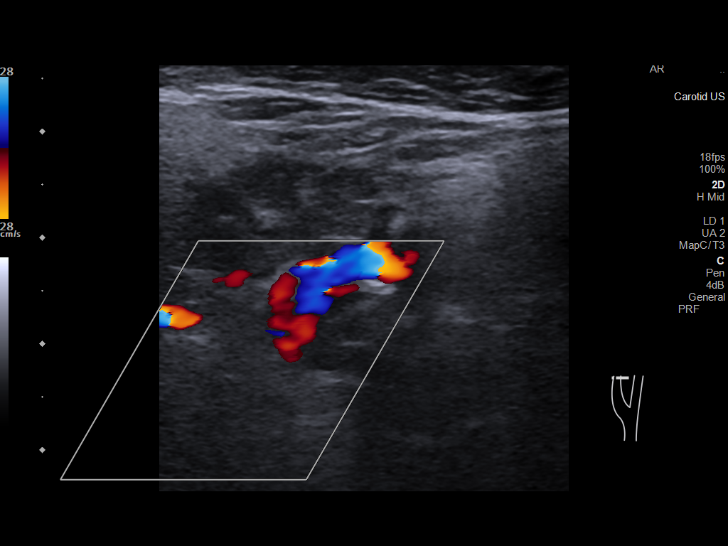
[im 36/69]
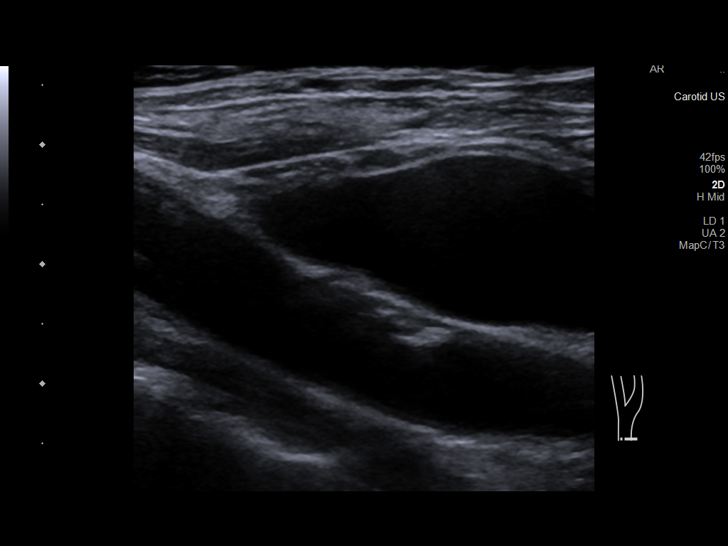
[im 39/69]
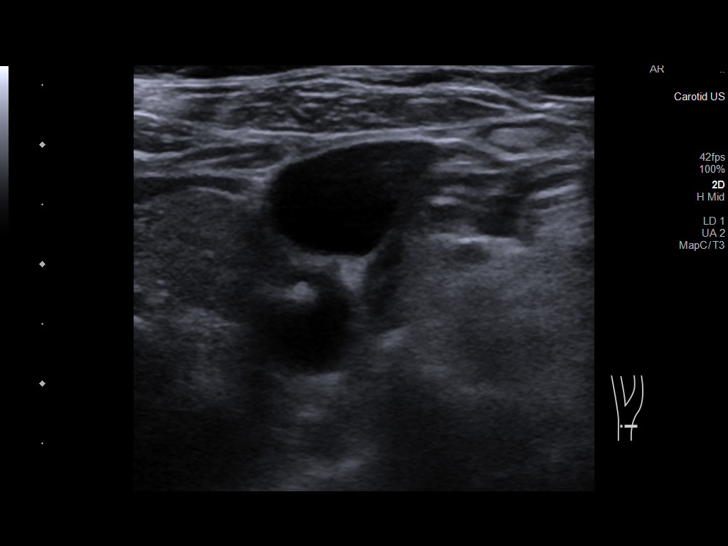
[im 45/69]
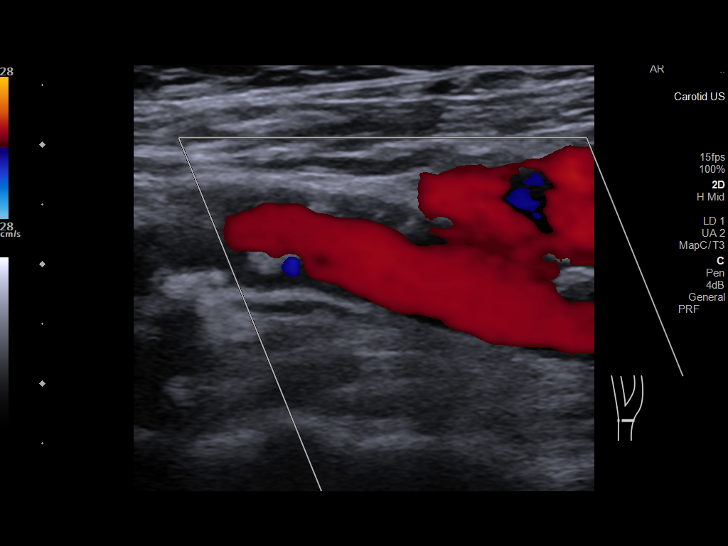
[im 51/69]
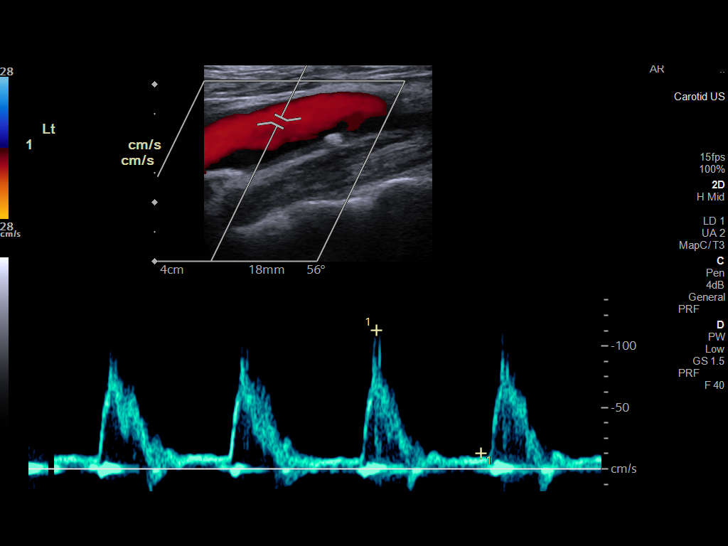
[im 57/69]
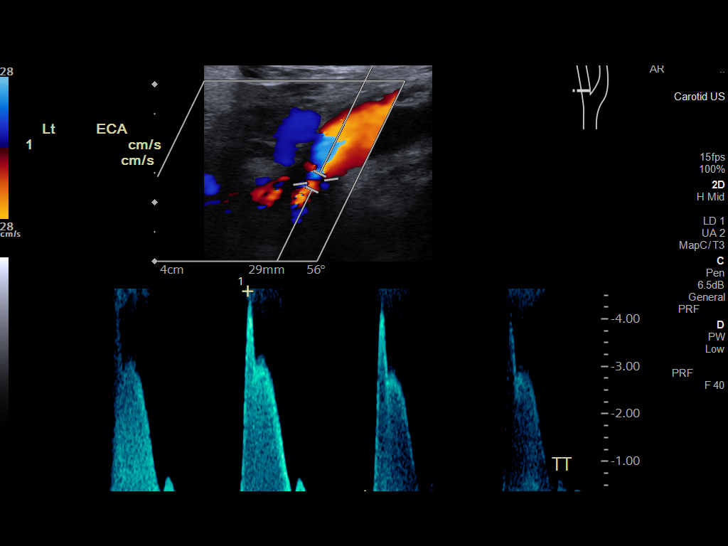
[im 63/69]
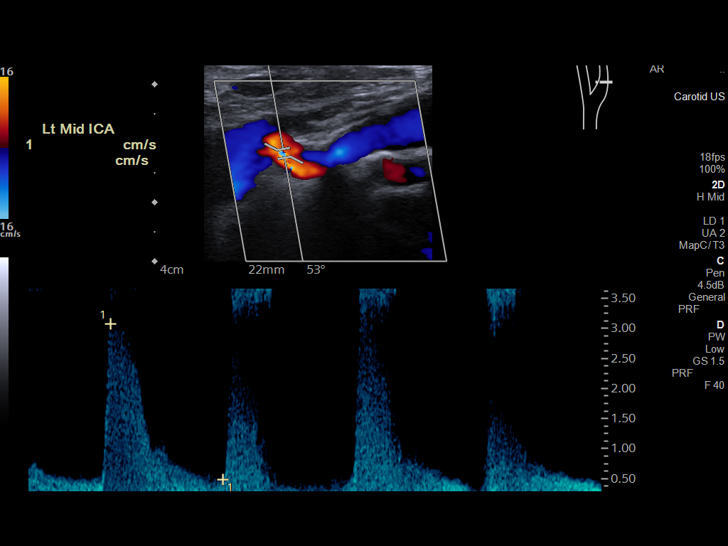
[im 69/69]
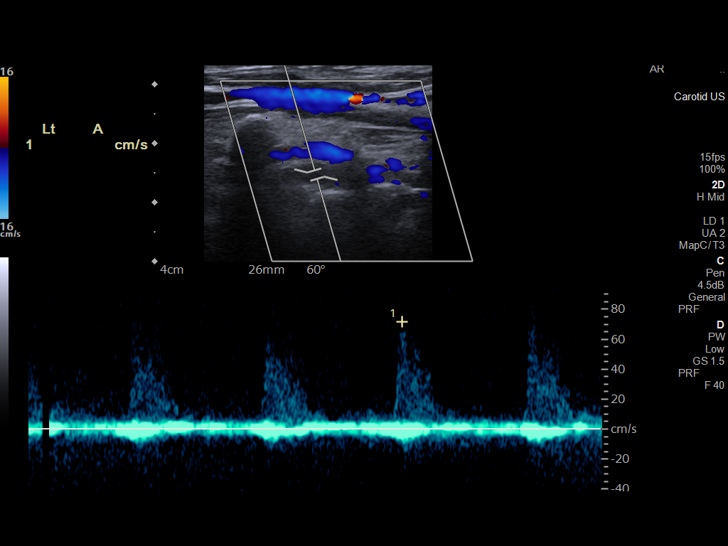

[13 of 24 positions shown; findings below may reference images not displayed]

FINDINGS: Criteria: Quantification of carotid stenosis is based on velocity
parameters that correlate the residual internal carotid diameter
with NASCET-based stenosis levels, using the diameter of the distal
internal carotid lumen as the denominator for stenosis measurement.

The following velocity measurements were obtained:

RIGHT

ICA: 194/28 cm/sec

CCA: 85/12 cm/sec

SYSTOLIC ICA/CCA RATIO:

ECA: 105 cm/sec

LEFT

ICA: 308/49 cm/sec

CCA: 101/11 cm/sec

SYSTOLIC ICA/CCA RATIO:

ECA: 458 cm/sec

RIGHT CAROTID ARTERY: Echogenic plaque in the right common carotid
artery. Echogenic plaque at the right carotid bulb. External carotid
artery is patent with normal waveform. Irregular echogenic plaque in
the proximal internal carotid artery. Elevated peak systolic
velocity in the mid internal carotid artery measuring 194 cm/sec.

RIGHT VERTEBRAL ARTERY: Antegrade flow and normal waveform in the
right vertebral artery.

LEFT CAROTID ARTERY: Echogenic plaque in the left common carotid
artery. Elevated peak systolic velocity in the left external carotid
artery measuring 458 cm/sec. Echogenic plaque in the left internal
carotid artery. Markedly elevated peak systolic velocity in the mid
internal carotid artery with a peak systolic velocity of 308 cm/sec.

LEFT VERTEBRAL ARTERY: Antegrade flow and normal waveform in the
left vertebral artery.
IMPRESSION: 1. Estimated degree of stenosis in left internal carotid artery is
greater than 70%. In addition, evidence for stenosis in the left
external carotid artery.
2. Estimated degree of stenosis in the right internal carotid artery
is 50-69%.
3. Bilateral vertebral arteries are patent with antegrade flow.

## 2021-10-08 MED ORDER — FENOFIBRATE 145 MG PO TABS: 145.0000 mg | ORAL_TABLET | Freq: Every day | ORAL | 1 refills | Status: DC

## 2021-10-08 NOTE — Discharge Summary (Signed)
Physician Discharge Summary  [name] ZDG:[id number] DOB: 29-Jul-1951 DOA: 10/05/2021  PCP: Ivy Lynn, NP  Admit date: 10/05/2021 Discharge date: 10/08/2021  Admitted From: Home Disposition:  Home   Recommendations for Outpatient Follow-up:  Follow up with PCP in 1-2 weeks Please obtain BMP/CBC in one week     Discharge Condition: Stable CODE STATUS:DNR Diet recommendation: Heart Healthy    Brief/Interim Summary: 70 y.o. female, with history of  PAD (s/p multiple iliac and SFA interventions with ultimate R AKA in 05/2017), paroxysmal atrial fibrillation (on Eliquis and Flecainide), CAD (nonobstructive CAD by cath in 2014), carotid stenosis (50-69% on left) HTN hyperlipidemia, mitral regurgitation, paroxysmal atrial fibrillation, statin intolerance, stroke presented with numbness and weakness.  [name] presented to [facility] ED during the early morning hours of 10/05/2021 for evaluation of slurred speech and left facial droop. Did have weakness along her arms and legs bilaterally. While she was in the ED, her symptoms started to resolve on their own and she did not receive tPAOn presentation, CT head was negative for acute intracranial abnormality.  MRI of brain showed tiny acute to subacute infarct in the right centrum semiovale.  Neurology was consulted.  Discharge Diagnoses:  Acute ischemic CVA -Presented with numbness, dysarthria and weakness.  CT of the head was negative for acute intracranial abnormality. -MRI of brain showed tiny acute to subacute infarct in the right centrum semiovale. -Echo showed EF of 70 to 75% with findings suggestive of HOCM: Suggested routine outpatient cardiac MRI.  Communicated with Dr. McDowell/cardiology via secure chat who recommended that this can be obtained as an outpatient when patient follows up with her regular cardiologist/Dr. [name] -Follow neurology recommendations. -carotid US--50-69% stenosis on Right; >70% on  left -discussed with Dr. Merlene Laughter on day of d/c>>ok for d/c with outpatient follow up with vascular surgery for carotid stenosis -Diet as per SLP recommendations.  Will need home health SLP.  Patient tolerating PT. -Continue Eliquis along with aspirin for now.  Intolerant to statin.  Hypertension -Monitor blood pressure.  Allow for permissive hypertension.  Hold diltiazem and chlorthalidone>>restart after d/c  Acute kidney injury  -resolved.  Creatinine 0.91 today.  Treated with IV fluids. -baseline creatinine 0.8-0.9 -presented with serum creatinine 1.25  Paroxysmal A. fib -Continue flecainide and Eliquis.  Outpatient follow-up with cardiology  Hyperlipidemia -Continue ezetimibe and fenofibrate  GERD -Continue Protonix  Discharge Instructions  Discharge Instructions     Ambulatory referral to Vascular Surgery   Complete by: As directed    Left>right Carotid Stenosis   Ambulatory referral to Vascular Surgery   Complete by: As directed    Left>right carotid stenosis      Allergies as of 10/08/2021       Reactions   Atenolol Rash, Itching   Demerol  [meperidine Hcl] Itching   Demerol [meperidine] Other (See Comments), Itching   Unknown, can't remember  Unknown, can't remember    Gabapentin Anxiety, Other (See Comments)   SI and HI   Inderal [propranolol] Other (See Comments), Itching   Doesn't remember    Prednisone Other (See Comments)   Muscle spasms   Statins Itching, Other (See Comments)   Simvastatin-caused severe itching Pravastatin-caused lesser tiching One type of statin? Caused stroke in 2006, and other symptoms as result   Warfarin And Related Other (See Comments)   Caused nose bleeds   Crestor [rosuvastatin] Itching, Rash   Docosahexaenoic Acid (dha)    Other reaction(s): Other (See Comments) nosebleeds   Lactose Intolerance (  gi) Other (See Comments)   Bloating, gas   Lipitor [atorvastatin] Rash   Lovaza [omega-3-acid Ethyl Esters] Other (See  Comments)   nosebleeds   Penicillins Hives, Rash, Other (See Comments)   Has patient had a PCN reaction causing immediate rash, facial/tongue/throat swelling, SOB or lightheadedness with hypotension: Yes Has patient had a PCN reaction causing severe rash involving mucus membranes or skin necrosis: No Has patient had a PCN reaction that required hospitalization: No Has patient had a PCN reaction occurring within the last 10 years: No If all of the above answers are "NO", then may proceed with Cephalosporin use. Questionable high fever    Pravastatin Itching, Rash   Simvastatin Itching, Rash, Other (See Comments)   Trilipix [choline Fenofibrate] Other (See Comments)   Doesn't remember    Warfarin Other (See Comments)   nosebleeds   Hydralazine Swelling   Effexor [venlafaxine Hcl] Other (See Comments)   Doesn't remember    Nexium [esomeprazole Magnesium] Rash   Percocet [oxycodone-acetaminophen] Other (See Comments)   Doesn't remember    Plavix [clopidogrel Bisulfate] Rash        Medication List     STOP taking these medications    alendronate 70 MG tablet Commonly known as: FOSAMAX   lisinopril 20 MG tablet Commonly known as: ZESTRIL   mupirocin ointment 2 % Commonly known as: BACTROBAN   triamcinolone cream 0.1 % Commonly known as: KENALOG       TAKE these medications    acetaminophen 500 MG tablet Commonly known as: TYLENOL Take 2 tablets (1,000 mg total) by mouth every 6 (six) hours.   ARTIFICIAL TEAR OP Place 1 drop into both eyes as needed (for dry eyes).   aspirin EC 325 MG tablet Take 325 mg by mouth daily.   calcium-vitamin D 500-200 MG-UNIT tablet Commonly known as: OSCAL WITH D Take 1 tablet by mouth daily with breakfast.   chlorthalidone 25 MG tablet Commonly known as: HYGROTON Take 1 tablet (25 mg total) by mouth daily.   diclofenac Sodium 1 % Gel Commonly known as: VOLTAREN Apply 2 g topically 4 (four) times daily.   diltiazem 240 MG 24  hr capsule Commonly known as: CARDIZEM CD TAKE 1 CAPSULE EVERY MORNING What changed: when to take this   Eliquis 5 MG Tabs tablet Generic drug: apixaban TAKE ONE TABLET TWICE DAILY   escitalopram 20 MG tablet Commonly known as: LEXAPRO TAKE 1 TABLET DAILY   ezetimibe 10 MG tablet Commonly known as: ZETIA TAKE 1 TABLET DAILY   fenofibrate 145 MG tablet Commonly known as: TRICOR Take 1 tablet (145 mg total) by mouth daily. (NEEDS TO BE SEEN BEFORE NEXT REFILL)   fenofibrate 145 MG tablet Commonly known as: TRICOR Take 1 tablet (145 mg total) by mouth daily.   flecainide 50 MG tablet Commonly known as: TAMBOCOR TAKE 1 TABLET 2 TIMES A DAY   hydroxyurea 500 MG capsule Commonly known as: HYDREA Take 1 capsule (500 mg total) by mouth daily. Take one capsule daily except Sundays. May take with food to minimize GI side effects.   omeprazole 20 MG capsule Commonly known as: PRILOSEC TAKE 1 CAPSULE 2 TIMES A DAY BEFORE A MEAL What changed:  how much to take how to take this when to take this additional instructions   traMADol 50 MG tablet Commonly known as: ULTRAM Take 1 tablet (50 mg total) by mouth 2 (two) times daily.   valsartan 160 MG tablet Commonly known as: DIOVAN TAKE ONE TABLET  ONCE DAILY        Follow-up Information     Deberah Pelton, NP Follow up on 10/23/2021.   Specialty: Cardiology Why: [facility] Follow-up on 10/23/2021 at 1:30 PM with Coletta Memos, NP (works with Dr. [name]). Please bring a log of your recent blood pressure readings if you can. Contact information: [address] [place] [address] [phone]                Allergies  Allergen Reactions   Atenolol Rash and Itching   Demerol  [Meperidine Hcl] Itching   Demerol [Meperidine] Other (See Comments) and Itching    Unknown, can't remember  Unknown, can't remember    Gabapentin Anxiety and Other (See Comments)    SI and HI   Inderal [Propranolol]  Other (See Comments) and Itching    Doesn't remember    Prednisone Other (See Comments)    Muscle spasms   Statins Itching and Other (See Comments)    Simvastatin-caused severe itching Pravastatin-caused lesser tiching One type of statin? Caused stroke in 2006, and other symptoms as result   Warfarin And Related Other (See Comments)    Caused nose bleeds   Crestor [Rosuvastatin] Itching and Rash   Docosahexaenoic Acid (Dha)     Other reaction(s): Other (See Comments) nosebleeds   Lactose Intolerance (Gi) Other (See Comments)    Bloating, gas   Lipitor [Atorvastatin] Rash   Lovaza [Omega-3-Acid Ethyl Esters] Other (See Comments)    nosebleeds    Penicillins Hives, Rash and Other (See Comments)    Has patient had a PCN reaction causing immediate rash, facial/tongue/throat swelling, SOB or lightheadedness with hypotension: Yes Has patient had a PCN reaction causing severe rash involving mucus membranes or skin necrosis: No Has patient had a PCN reaction that required hospitalization: No Has patient had a PCN reaction occurring within the last 10 years: No If all of the above answers are "NO", then may proceed with Cephalosporin use.  Questionable high fever    Pravastatin Itching and Rash   Simvastatin Itching, Rash and Other (See Comments)   Trilipix [Choline Fenofibrate] Other (See Comments)    Doesn't remember    Warfarin Other (See Comments)    nosebleeds   Hydralazine Swelling   Effexor [Venlafaxine Hcl] Other (See Comments)    Doesn't remember    Nexium [Esomeprazole Magnesium] Rash   Percocet [Oxycodone-Acetaminophen] Other (See Comments)    Doesn't remember    Plavix [Clopidogrel Bisulfate] Rash    Consultations: Neurology cardiology   Procedures/Studies: CT HEAD WO CONTRAST  Result Date: 10/05/2021 CLINICAL DATA:  TIA EXAM: CT HEAD WITHOUT CONTRAST TECHNIQUE: Contiguous axial images were obtained from the base of the skull through the vertex without  intravenous contrast. COMPARISON:  09/10/2021 FINDINGS: Brain: No acute intracranial abnormality. Specifically, no hemorrhage, hydrocephalus, mass lesion, acute infarction, or significant intracranial injury. Vascular: No hyperdense vessel or unexpected calcification. Skull: No acute calvarial abnormality. Sinuses/Orbits: No acute findings Other: None IMPRESSION: No acute intracranial abnormality. Electronically Signed   By: Rolm Baptise M.D.   On: 10/05/2021 21:35   CT Head Wo Contrast  Result Date: 09/10/2021 CLINICAL DATA:  Headache.  Evaluate for intracranial hemorrhage. EXAM: CT HEAD WITHOUT CONTRAST TECHNIQUE: Contiguous axial images were obtained from the base of the skull through the vertex without intravenous contrast. COMPARISON:  03/08/2020 FINDINGS: Brain: No evidence of acute infarction, hemorrhage, hydrocephalus, extra-axial collection or mass lesion/mass effect. There is mild diffuse low-attenuation within the subcortical and  periventricular white matter compatible with chronic microvascular disease. Vascular: No hyperdense vessel or unexpected calcification. Skull: Normal. Negative for fracture or focal lesion. Sinuses/Orbits: No acute finding. Other: None. IMPRESSION: 1. No acute intracranial abnormalities. 2. Chronic small vessel ischemic change and brain atrophy. Electronically Signed   By: Kerby Moors M.D.   On: 09/10/2021 10:25   MR ANGIO HEAD WO CONTRAST  Result Date: 10/06/2021 CLINICAL DATA:  TIA, altered mental status, weakness EXAM: MRI HEAD WITHOUT CONTRAST MRA HEAD WITHOUT CONTRAST TECHNIQUE: Multiplanar, multi-echo pulse sequences of the brain and surrounding structures were acquired without intravenous contrast. Angiographic images of the Circle of Willis were acquired using MRA technique without intravenous contrast. COMPARISON:  Noncontrast CT head obtained 1 day prior FINDINGS: MRI HEAD FINDINGS Brain: There is a tiny focus of faint diffusion restriction in the right  centrum semiovale which could reflect a tiny acute to subacute infarct. There is no other evidence of acute infarct. There is no acute intracranial hemorrhage or extra-axial fluid collection. There is unchanged global parenchymal volume loss. Confluent FLAIR signal abnormality throughout the subcortical and periventricular white matter likely reflects sequela of moderate chronic white matter microangiopathy. There are small remote lacunar infarcts in the right caudate head and basal ganglia. There is no mass lesion.  There is no midline shift. Vascular: The major flow voids are present. The vasculature is better assessed below. Skull and upper cervical spine: Normal marrow signal. Sinuses/Orbits: The imaged paranasal sinuses are clear. Bilateral lens implants are in place. The globes and orbits are otherwise unremarkable. Other: None. MRA HEAD FINDINGS Anterior circulation: There is mild irregularity of the intracranial ICAs likely reflecting atherosclerotic disease. There is no high-grade stenosis or occlusion. The bilateral MCAs are patent. The left A1 segment is diminutive, likely a normal variant. There is focal moderate stenosis of the left A2 segment (10-148). The bilateral ACAs are otherwise patent. There is multifocal irregularity of the right A2 segment with up to severe stenosis (081-448). There is no aneurysm. Posterior circulation: There is minimal flow related enhancement in the proximal left V4 segment. The distal left V4 segment is patent. The right V4 segment is patent with mild multifocal irregularity and narrowing. The basilar artery is patent with mild multifocal irregularity and narrowing. There is focal stenosis at the right P1/P2 junction, though the right PCA is supplied by a prominent right posterior communicating artery. The bilateral PCAs are otherwise patent with mild multifocal irregularity and narrowing throughout. There is no aneurysm. Anatomic variants: As above. IMPRESSION: 1. Tiny  acute to subacute infarct in the right centrum semiovale. No other acute infarct or other acute intracranial pathology. 2. Mild global parenchymal volume loss and moderate chronic white matter microangiopathy. 3. Multifocal irregularity and narrowing of the right A2 segment with up to severe stenosis, and moderate focal stenosis of the left A2 segment. 4. Diminutive flow related enhancement in the proximal left V4 segment. This may reflect high-grade stenosis or occlusion due to atherosclerotic disease with possible retrograde flow via the right V4 segment. CTA head could be obtained for further evaluation. 5. Focal stenosis at the right P1/P2 junction, though the right PCA is primarily supplied by a prominent posterior communicating artery. 6. Otherwise, mild irregularity of the remainder of the intracranial vasculature as detailed above. Electronically Signed   By: Valetta Mole M.D.   On: 10/06/2021 10:17   MR BRAIN WO CONTRAST  Result Date: 10/06/2021 CLINICAL DATA:  TIA, altered mental status, weakness EXAM: MRI HEAD WITHOUT CONTRAST MRA  HEAD WITHOUT CONTRAST TECHNIQUE: Multiplanar, multi-echo pulse sequences of the brain and surrounding structures were acquired without intravenous contrast. Angiographic images of the Circle of Willis were acquired using MRA technique without intravenous contrast. COMPARISON:  Noncontrast CT head obtained 1 day prior FINDINGS: MRI HEAD FINDINGS Brain: There is a tiny focus of faint diffusion restriction in the right centrum semiovale which could reflect a tiny acute to subacute infarct. There is no other evidence of acute infarct. There is no acute intracranial hemorrhage or extra-axial fluid collection. There is unchanged global parenchymal volume loss. Confluent FLAIR signal abnormality throughout the subcortical and periventricular white matter likely reflects sequela of moderate chronic white matter microangiopathy. There are small remote lacunar infarcts in the right  caudate head and basal ganglia. There is no mass lesion.  There is no midline shift. Vascular: The major flow voids are present. The vasculature is better assessed below. Skull and upper cervical spine: Normal marrow signal. Sinuses/Orbits: The imaged paranasal sinuses are clear. Bilateral lens implants are in place. The globes and orbits are otherwise unremarkable. Other: None. MRA HEAD FINDINGS Anterior circulation: There is mild irregularity of the intracranial ICAs likely reflecting atherosclerotic disease. There is no high-grade stenosis or occlusion. The bilateral MCAs are patent. The left A1 segment is diminutive, likely a normal variant. There is focal moderate stenosis of the left A2 segment (10-148). The bilateral ACAs are otherwise patent. There is multifocal irregularity of the right A2 segment with up to severe stenosis (270-350). There is no aneurysm. Posterior circulation: There is minimal flow related enhancement in the proximal left V4 segment. The distal left V4 segment is patent. The right V4 segment is patent with mild multifocal irregularity and narrowing. The basilar artery is patent with mild multifocal irregularity and narrowing. There is focal stenosis at the right P1/P2 junction, though the right PCA is supplied by a prominent right posterior communicating artery. The bilateral PCAs are otherwise patent with mild multifocal irregularity and narrowing throughout. There is no aneurysm. Anatomic variants: As above. IMPRESSION: 1. Tiny acute to subacute infarct in the right centrum semiovale. No other acute infarct or other acute intracranial pathology. 2. Mild global parenchymal volume loss and moderate chronic white matter microangiopathy. 3. Multifocal irregularity and narrowing of the right A2 segment with up to severe stenosis, and moderate focal stenosis of the left A2 segment. 4. Diminutive flow related enhancement in the proximal left V4 segment. This may reflect high-grade stenosis or  occlusion due to atherosclerotic disease with possible retrograde flow via the right V4 segment. CTA head could be obtained for further evaluation. 5. Focal stenosis at the right P1/P2 junction, though the right PCA is primarily supplied by a prominent posterior communicating artery. 6. Otherwise, mild irregularity of the remainder of the intracranial vasculature as detailed above. Electronically Signed   By: Valetta Mole M.D.   On: 10/06/2021 10:17   US Carotid Bilateral  Result Date: 10/08/2021 CLINICAL DATA:  Acute ischemic stroke. EXAM: BILATERAL CAROTID DUPLEX ULTRASOUND TECHNIQUE: Pearline Cables scale imaging, color Doppler and duplex ultrasound were performed of bilateral carotid and vertebral arteries in the neck. COMPARISON:  None. FINDINGS: Criteria: Quantification of carotid stenosis is based on velocity parameters that correlate the residual internal carotid diameter with NASCET-based stenosis levels, using the diameter of the distal internal carotid lumen as the denominator for stenosis measurement. The following velocity measurements were obtained: RIGHT ICA: 194/28 cm/sec CCA: 09/38 cm/sec SYSTOLIC ICA/CCA RATIO:  2.3 ECA: 105 cm/sec LEFT ICA: 308/49 cm/sec CCA: 101/11  cm/sec SYSTOLIC ICA/CCA RATIO:  3.1 ECA: 458 cm/sec RIGHT CAROTID ARTERY: Echogenic plaque in the right common carotid artery. Echogenic plaque at the right carotid bulb. External carotid artery is patent with normal waveform. Irregular echogenic plaque in the proximal internal carotid artery. Elevated peak systolic velocity in the mid internal carotid artery measuring 194 cm/sec. RIGHT VERTEBRAL ARTERY: Antegrade flow and normal waveform in the right vertebral artery. LEFT CAROTID ARTERY: Echogenic plaque in the left common carotid artery. Elevated peak systolic velocity in the left external carotid artery measuring 458 cm/sec. Echogenic plaque in the left internal carotid artery. Markedly elevated peak systolic velocity in the mid internal  carotid artery with a peak systolic velocity of 627 cm/sec. LEFT VERTEBRAL ARTERY: Antegrade flow and normal waveform in the left vertebral artery. IMPRESSION: 1. Estimated degree of stenosis in left internal carotid artery is greater than 70%. In addition, evidence for stenosis in the left external carotid artery. 2. Estimated degree of stenosis in the right internal carotid artery is 50-69%. 3. Bilateral vertebral arteries are patent with antegrade flow. Electronically Signed   By: Markus Daft M.D.   On: 10/08/2021 11:41   ECHOCARDIOGRAM COMPLETE  Result Date: 10/06/2021    ECHOCARDIOGRAM REPORT   Patient Name:   [name] Date of Exam: 10/06/2021 Medical Rec #:  [id number]      Height:       65.5 in Accession #:    [id number]     Weight:       136.0 lb Date of Birth:  1951-08-12       BSA:          1.689 m Patient Age:    76 years       BP:           104/86 mmHg Patient Gender: F              HR:           78 bpm. Exam Location:  [facility] Procedure: 2D Echo, Cardiac Doppler and Color Doppler Indications:    TIA  History:        Patient has prior history of Echocardiogram examinations, most                 recent 05/18/2013. CAD and Previous Myocardial Infarction, PAD                 and TIA, Arrythmias:Atrial Fibrillation; Risk                 Factors:Hypertension and Dyslipidemia.  Sonographer:    Wenda Low Referring Phys: [id number] ASIA B Woodson  1. There is severe asymmetric hypertrophy of the basal septum (up to 18 mm). There is chordal SAM but no SAM of the mitral valve. There is a mid cavitary gradient up to 15-17 mmHG. No signs of obstruction. Findings possibly represent hypertrophic cardiomyopathy. Would recommend a non-urgent cardiac MRI for clarification. Left ventricular ejection fraction, by estimation, is 70 to 75%. The left ventricle has hyperdynamic function. The left ventricle has no regional wall motion abnormalities. There  is severe asymmetric left ventricular  hypertrophy of the basal-septal segment. Left ventricular diastolic parameters are consistent with Grade I diastolic dysfunction (impaired relaxation).  2. Right ventricular systolic function is normal. The right ventricular size is normal. Tricuspid regurgitation signal is inadequate for assessing PA pressure.  3. The mitral valve is degenerative. Trivial mitral valve regurgitation. No evidence of mitral stenosis.  4. The aortic valve  is tricuspid. There is mild calcification of the aortic valve. Aortic valve regurgitation is not visualized. Mild aortic valve sclerosis is present, with no evidence of aortic valve stenosis.  5. The inferior vena cava is normal in size with greater than 50% respiratory variability, suggesting right atrial pressure of 3 mmHg. Conclusion(s)/Recommendation(s): No intracardiac source of embolism detected on this transthoracic study. A transesophageal echocardiogram is recommended to exclude cardiac source of embolism if clinically indicated. FINDINGS  Left Ventricle: There is severe asymmetric hypertrophy of the basal septum (up to 18 mm). There is chordal SAM but no SAM of the mitral valve. There is a mid cavitary gradient up to 15-17 mmHG. No signs of obstruction. Findings possibly represent hypertrophic cardiomyopathy. Would recommend a non-urgent cardiac MRI for clarification. Left ventricular ejection fraction, by estimation, is 70 to 75%. The left ventricle has hyperdynamic function. The left ventricle has no regional wall motion abnormalities. The left ventricular internal cavity size was small. There is severe asymmetric left ventricular hypertrophy of the basal-septal segment. Left ventricular diastolic parameters are consistent with Grade I diastolic dysfunction (impaired relaxation). Right Ventricle: The right ventricular size is normal. No increase in right ventricular wall thickness. Right ventricular systolic function is normal. Tricuspid regurgitation signal is inadequate  for assessing PA pressure. Left Atrium: Left atrial size was normal in size. Right Atrium: Right atrial size was normal in size. Pericardium: There is no evidence of pericardial effusion. Presence of pericardial fat pad. Mitral Valve: The mitral valve is degenerative in appearance. Trivial mitral valve regurgitation. No evidence of mitral valve stenosis. MV peak gradient, 14.1 mmHg. The mean mitral valve gradient is 6.0 mmHg. Tricuspid Valve: The tricuspid valve is grossly normal. Tricuspid valve regurgitation is trivial. No evidence of tricuspid stenosis. Aortic Valve: The aortic valve is tricuspid. There is mild calcification of the aortic valve. Aortic valve regurgitation is not visualized. Mild aortic valve sclerosis is present, with no evidence of aortic valve stenosis. Aortic valve mean gradient measures 6.5 mmHg. Aortic valve peak gradient measures 13.2 mmHg. Aortic valve area, by VTI measures 3.12 cm. Pulmonic Valve: The pulmonic valve was grossly normal. Pulmonic valve regurgitation is trivial. No evidence of pulmonic stenosis. Aorta: The aortic root is normal in size and structure. Venous: The inferior vena cava is normal in size with greater than 50% respiratory variability, suggesting right atrial pressure of 3 mmHg. IAS/Shunts: The atrial septum is grossly normal.  LEFT VENTRICLE PLAX 2D LVIDd:         3.50 cm   Diastology LVIDs:         2.30 cm   LV e' medial:    5.00 cm/s LV PW:         1.30 cm   LV E/e' medial:  18.2 LV IVS:        1.80 cm   LV e' lateral:   7.07 cm/s LVOT diam:     2.00 cm   LV E/e' lateral: 12.8 LV SV:         99 LV SV Index:   58 LVOT Area:     3.14 cm  RIGHT VENTRICLE RV Basal diam:  2.80 cm RV Mid diam:    2.90 cm RV S prime:     16.80 cm/s TAPSE (M-mode): 2.2 cm LEFT ATRIUM             Index        RIGHT ATRIUM           Index LA diam:  3.70 cm 2.19 cm/m   RA Area:     14.00 cm LA Vol (A2C):   45.1 ml 26.71 ml/m  RA Volume:   30.20 ml  17.88 ml/m LA Vol (A4C):    46.1 ml 27.30 ml/m LA Biplane Vol: 45.6 ml 27.00 ml/m  AORTIC VALVE                     PULMONIC VALVE AV Area (Vmax):    3.09 cm      PV Vmax:       1.02 m/s AV Area (Vmean):   3.40 cm      PV Peak grad:  4.2 mmHg AV Area (VTI):     3.12 cm AV Vmax:           182.00 cm/s AV Vmean:          110.000 cm/s AV VTI:            0.316 m AV Peak Grad:      13.2 mmHg AV Mean Grad:      6.5 mmHg LVOT Vmax:         179.00 cm/s LVOT Vmean:        119.000 cm/s LVOT VTI:          0.314 m LVOT/AV VTI ratio: 0.99  AORTA Ao Root diam: 2.80 cm MITRAL VALVE MV Area (PHT): 3.08 cm     SHUNTS MV Area VTI:   2.49 cm     Systemic VTI:  0.31 m MV Peak grad:  14.1 mmHg    Systemic Diam: 2.00 cm MV Mean grad:  6.0 mmHg MV Vmax:       1.88 m/s MV Vmean:      108.0 cm/s MV Decel Time: 246 msec MV E velocity: 90.80 cm/s MV A velocity: 137.00 cm/s MV E/A ratio:  0.66 Eleonore Chiquito MD Electronically signed by Eleonore Chiquito MD Signature Date/Time: 10/06/2021/4:20:52 PM    Final         Discharge Exam: Vitals:   10/08/21 0543 10/08/21 0600  BP: (!) 157/89 (!) 152/94  Pulse: 72 79  Resp:    Temp:    SpO2:     Vitals:   10/08/21 0534 10/08/21 0538 10/08/21 0543 10/08/21 0600  BP:  (!) 143/101 (!) 157/89 (!) 152/94  Pulse: 76 89 72 79  Resp: 19     Temp: 98 F (36.7 C)     TempSrc:      SpO2: 97%     Weight:      Height:        General: Pt is alert, awake, not in acute distress Cardiovascular: RRR, S1/S2 +, no rubs, no gallops Respiratory: CTA bilaterally, no wheezing, no rhonchi Abdominal: Soft, NT, ND, bowel sounds + Extremities: no edema, no cyanosis   The results of significant diagnostics from this hospitalization (including imaging, microbiology, ancillary and laboratory) are listed below for reference.    Significant Diagnostic Studies: CT HEAD WO CONTRAST  Result Date: 10/05/2021 CLINICAL DATA:  TIA EXAM: CT HEAD WITHOUT CONTRAST TECHNIQUE: Contiguous axial images were obtained from the base of  the skull through the vertex without intravenous contrast. COMPARISON:  09/10/2021 FINDINGS: Brain: No acute intracranial abnormality. Specifically, no hemorrhage, hydrocephalus, mass lesion, acute infarction, or significant intracranial injury. Vascular: No hyperdense vessel or unexpected calcification. Skull: No acute calvarial abnormality. Sinuses/Orbits: No acute findings Other: None IMPRESSION: No acute intracranial abnormality. Electronically Signed   By: Rolm Baptise M.D.  On: 10/05/2021 21:35   CT Head Wo Contrast  Result Date: 09/10/2021 CLINICAL DATA:  Headache.  Evaluate for intracranial hemorrhage. EXAM: CT HEAD WITHOUT CONTRAST TECHNIQUE: Contiguous axial images were obtained from the base of the skull through the vertex without intravenous contrast. COMPARISON:  03/08/2020 FINDINGS: Brain: No evidence of acute infarction, hemorrhage, hydrocephalus, extra-axial collection or mass lesion/mass effect. There is mild diffuse low-attenuation within the subcortical and periventricular white matter compatible with chronic microvascular disease. Vascular: No hyperdense vessel or unexpected calcification. Skull: Normal. Negative for fracture or focal lesion. Sinuses/Orbits: No acute finding. Other: None. IMPRESSION: 1. No acute intracranial abnormalities. 2. Chronic small vessel ischemic change and brain atrophy. Electronically Signed   By: Kerby Moors M.D.   On: 09/10/2021 10:25   MR ANGIO HEAD WO CONTRAST  Result Date: 10/06/2021 CLINICAL DATA:  TIA, altered mental status, weakness EXAM: MRI HEAD WITHOUT CONTRAST MRA HEAD WITHOUT CONTRAST TECHNIQUE: Multiplanar, multi-echo pulse sequences of the brain and surrounding structures were acquired without intravenous contrast. Angiographic images of the Circle of Willis were acquired using MRA technique without intravenous contrast. COMPARISON:  Noncontrast CT head obtained 1 day prior FINDINGS: MRI HEAD FINDINGS Brain: There is a tiny focus of faint  diffusion restriction in the right centrum semiovale which could reflect a tiny acute to subacute infarct. There is no other evidence of acute infarct. There is no acute intracranial hemorrhage or extra-axial fluid collection. There is unchanged global parenchymal volume loss. Confluent FLAIR signal abnormality throughout the subcortical and periventricular white matter likely reflects sequela of moderate chronic white matter microangiopathy. There are small remote lacunar infarcts in the right caudate head and basal ganglia. There is no mass lesion.  There is no midline shift. Vascular: The major flow voids are present. The vasculature is better assessed below. Skull and upper cervical spine: Normal marrow signal. Sinuses/Orbits: The imaged paranasal sinuses are clear. Bilateral lens implants are in place. The globes and orbits are otherwise unremarkable. Other: None. MRA HEAD FINDINGS Anterior circulation: There is mild irregularity of the intracranial ICAs likely reflecting atherosclerotic disease. There is no high-grade stenosis or occlusion. The bilateral MCAs are patent. The left A1 segment is diminutive, likely a normal variant. There is focal moderate stenosis of the left A2 segment (10-148). The bilateral ACAs are otherwise patent. There is multifocal irregularity of the right A2 segment with up to severe stenosis (505-697). There is no aneurysm. Posterior circulation: There is minimal flow related enhancement in the proximal left V4 segment. The distal left V4 segment is patent. The right V4 segment is patent with mild multifocal irregularity and narrowing. The basilar artery is patent with mild multifocal irregularity and narrowing. There is focal stenosis at the right P1/P2 junction, though the right PCA is supplied by a prominent right posterior communicating artery. The bilateral PCAs are otherwise patent with mild multifocal irregularity and narrowing throughout. There is no aneurysm. Anatomic  variants: As above. IMPRESSION: 1. Tiny acute to subacute infarct in the right centrum semiovale. No other acute infarct or other acute intracranial pathology. 2. Mild global parenchymal volume loss and moderate chronic white matter microangiopathy. 3. Multifocal irregularity and narrowing of the right A2 segment with up to severe stenosis, and moderate focal stenosis of the left A2 segment. 4. Diminutive flow related enhancement in the proximal left V4 segment. This may reflect high-grade stenosis or occlusion due to atherosclerotic disease with possible retrograde flow via the right V4 segment. CTA head could be obtained for further evaluation. 5.  Focal stenosis at the right P1/P2 junction, though the right PCA is primarily supplied by a prominent posterior communicating artery. 6. Otherwise, mild irregularity of the remainder of the intracranial vasculature as detailed above. Electronically Signed   By: Valetta Mole M.D.   On: 10/06/2021 10:17   MR BRAIN WO CONTRAST  Result Date: 10/06/2021 CLINICAL DATA:  TIA, altered mental status, weakness EXAM: MRI HEAD WITHOUT CONTRAST MRA HEAD WITHOUT CONTRAST TECHNIQUE: Multiplanar, multi-echo pulse sequences of the brain and surrounding structures were acquired without intravenous contrast. Angiographic images of the Circle of Willis were acquired using MRA technique without intravenous contrast. COMPARISON:  Noncontrast CT head obtained 1 day prior FINDINGS: MRI HEAD FINDINGS Brain: There is a tiny focus of faint diffusion restriction in the right centrum semiovale which could reflect a tiny acute to subacute infarct. There is no other evidence of acute infarct. There is no acute intracranial hemorrhage or extra-axial fluid collection. There is unchanged global parenchymal volume loss. Confluent FLAIR signal abnormality throughout the subcortical and periventricular white matter likely reflects sequela of moderate chronic white matter microangiopathy. There are  small remote lacunar infarcts in the right caudate head and basal ganglia. There is no mass lesion.  There is no midline shift. Vascular: The major flow voids are present. The vasculature is better assessed below. Skull and upper cervical spine: Normal marrow signal. Sinuses/Orbits: The imaged paranasal sinuses are clear. Bilateral lens implants are in place. The globes and orbits are otherwise unremarkable. Other: None. MRA HEAD FINDINGS Anterior circulation: There is mild irregularity of the intracranial ICAs likely reflecting atherosclerotic disease. There is no high-grade stenosis or occlusion. The bilateral MCAs are patent. The left A1 segment is diminutive, likely a normal variant. There is focal moderate stenosis of the left A2 segment (10-148). The bilateral ACAs are otherwise patent. There is multifocal irregularity of the right A2 segment with up to severe stenosis ([phone]). There is no aneurysm. Posterior circulation: There is minimal flow related enhancement in the proximal left V4 segment. The distal left V4 segment is patent. The right V4 segment is patent with mild multifocal irregularity and narrowing. The basilar artery is patent with mild multifocal irregularity and narrowing. There is focal stenosis at the right P1/P2 junction, though the right PCA is supplied by a prominent right posterior communicating artery. The bilateral PCAs are otherwise patent with mild multifocal irregularity and narrowing throughout. There is no aneurysm. Anatomic variants: As above. IMPRESSION: 1. Tiny acute to subacute infarct in the right centrum semiovale. No other acute infarct or other acute intracranial pathology. 2. Mild global parenchymal volume loss and moderate chronic white matter microangiopathy. 3. Multifocal irregularity and narrowing of the right A2 segment with up to severe stenosis, and moderate focal stenosis of the left A2 segment. 4. Diminutive flow related enhancement in the proximal left V4  segment. This may reflect high-grade stenosis or occlusion due to atherosclerotic disease with possible retrograde flow via the right V4 segment. CTA head could be obtained for further evaluation. 5. Focal stenosis at the right P1/P2 junction, though the right PCA is primarily supplied by a prominent posterior communicating artery. 6. Otherwise, mild irregularity of the remainder of the intracranial vasculature as detailed above. Electronically Signed   By: Valetta Mole M.D.   On: 10/06/2021 10:17   US Carotid Bilateral  Result Date: 10/08/2021 CLINICAL DATA:  Acute ischemic stroke. EXAM: BILATERAL CAROTID DUPLEX ULTRASOUND TECHNIQUE: Pearline Cables scale imaging, color Doppler and duplex ultrasound were performed of bilateral carotid and  vertebral arteries in the neck. COMPARISON:  None. FINDINGS: Criteria: Quantification of carotid stenosis is based on velocity parameters that correlate the residual internal carotid diameter with NASCET-based stenosis levels, using the diameter of the distal internal carotid lumen as the denominator for stenosis measurement. The following velocity measurements were obtained: RIGHT ICA: 194/28 cm/sec CCA: 43/15 cm/sec SYSTOLIC ICA/CCA RATIO:  2.3 ECA: 105 cm/sec LEFT ICA: 308/49 cm/sec CCA: 400/86 cm/sec SYSTOLIC ICA/CCA RATIO:  3.1 ECA: 458 cm/sec RIGHT CAROTID ARTERY: Echogenic plaque in the right common carotid artery. Echogenic plaque at the right carotid bulb. External carotid artery is patent with normal waveform. Irregular echogenic plaque in the proximal internal carotid artery. Elevated peak systolic velocity in the mid internal carotid artery measuring 194 cm/sec. RIGHT VERTEBRAL ARTERY: Antegrade flow and normal waveform in the right vertebral artery. LEFT CAROTID ARTERY: Echogenic plaque in the left common carotid artery. Elevated peak systolic velocity in the left external carotid artery measuring 458 cm/sec. Echogenic plaque in the left internal carotid artery. Markedly  elevated peak systolic velocity in the mid internal carotid artery with a peak systolic velocity of 761 cm/sec. LEFT VERTEBRAL ARTERY: Antegrade flow and normal waveform in the left vertebral artery. IMPRESSION: 1. Estimated degree of stenosis in left internal carotid artery is greater than 70%. In addition, evidence for stenosis in the left external carotid artery. 2. Estimated degree of stenosis in the right internal carotid artery is 50-69%. 3. Bilateral vertebral arteries are patent with antegrade flow. Electronically Signed   By: Markus Daft M.D.   On: 10/08/2021 11:41   ECHOCARDIOGRAM COMPLETE  Result Date: 10/06/2021    ECHOCARDIOGRAM REPORT   Patient Name:   [name] Date of Exam: 10/06/2021 Medical Rec #:  [id number]      Height:       65.5 in Accession #:    [id number]     Weight:       136.0 lb Date of Birth:  [birth year]       BSA:          1.689 m Patient Age:    18 years       BP:           104/86 mmHg Patient Gender: F              HR:           78 bpm. Exam Location:  [facility] Procedure: 2D Echo, Cardiac Doppler and Color Doppler Indications:    TIA  History:        Patient has prior history of Echocardiogram examinations, most                 recent 05/18/2013. CAD and Previous Myocardial Infarction, PAD                 and TIA, Arrythmias:Atrial Fibrillation; Risk                 Factors:Hypertension and Dyslipidemia.  Sonographer:    Wenda Low Referring Phys: [id number] ASIA B [place]  1. There is severe asymmetric hypertrophy of the basal septum (up to 18 mm). There is chordal SAM but no SAM of the mitral valve. There is a mid cavitary gradient up to 15-17 mmHG. No signs of obstruction. Findings possibly represent hypertrophic cardiomyopathy. Would recommend a non-urgent cardiac MRI for clarification. Left ventricular ejection fraction, by estimation, is 70 to 75%. The left ventricle has hyperdynamic function. The left ventricle has no regional wall  motion  abnormalities. There  is severe asymmetric left ventricular hypertrophy of the basal-septal segment. Left ventricular diastolic parameters are consistent with Grade I diastolic dysfunction (impaired relaxation).  2. Right ventricular systolic function is normal. The right ventricular size is normal. Tricuspid regurgitation signal is inadequate for assessing PA pressure.  3. The mitral valve is degenerative. Trivial mitral valve regurgitation. No evidence of mitral stenosis.  4. The aortic valve is tricuspid. There is mild calcification of the aortic valve. Aortic valve regurgitation is not visualized. Mild aortic valve sclerosis is present, with no evidence of aortic valve stenosis.  5. The inferior vena cava is normal in size with greater than 50% respiratory variability, suggesting right atrial pressure of 3 mmHg. Conclusion(s)/Recommendation(s): No intracardiac source of embolism detected on this transthoracic study. A transesophageal echocardiogram is recommended to exclude cardiac source of embolism if clinically indicated. FINDINGS  Left Ventricle: There is severe asymmetric hypertrophy of the basal septum (up to 18 mm). There is chordal SAM but no SAM of the mitral valve. There is a mid cavitary gradient up to 15-17 mmHG. No signs of obstruction. Findings possibly represent hypertrophic cardiomyopathy. Would recommend a non-urgent cardiac MRI for clarification. Left ventricular ejection fraction, by estimation, is 70 to 75%. The left ventricle has hyperdynamic function. The left ventricle has no regional wall motion abnormalities. The left ventricular internal cavity size was small. There is severe asymmetric left ventricular hypertrophy of the basal-septal segment. Left ventricular diastolic parameters are consistent with Grade I diastolic dysfunction (impaired relaxation). Right Ventricle: The right ventricular size is normal. No increase in right ventricular wall thickness. Right ventricular systolic  function is normal. Tricuspid regurgitation signal is inadequate for assessing PA pressure. Left Atrium: Left atrial size was normal in size. Right Atrium: Right atrial size was normal in size. Pericardium: There is no evidence of pericardial effusion. Presence of pericardial fat pad. Mitral Valve: The mitral valve is degenerative in appearance. Trivial mitral valve regurgitation. No evidence of mitral valve stenosis. MV peak gradient, 14.1 mmHg. The mean mitral valve gradient is 6.0 mmHg. Tricuspid Valve: The tricuspid valve is grossly normal. Tricuspid valve regurgitation is trivial. No evidence of tricuspid stenosis. Aortic Valve: The aortic valve is tricuspid. There is mild calcification of the aortic valve. Aortic valve regurgitation is not visualized. Mild aortic valve sclerosis is present, with no evidence of aortic valve stenosis. Aortic valve mean gradient measures 6.5 mmHg. Aortic valve peak gradient measures 13.2 mmHg. Aortic valve area, by VTI measures 3.12 cm. Pulmonic Valve: The pulmonic valve was grossly normal. Pulmonic valve regurgitation is trivial. No evidence of pulmonic stenosis. Aorta: The aortic root is normal in size and structure. Venous: The inferior vena cava is normal in size with greater than 50% respiratory variability, suggesting right atrial pressure of 3 mmHg. IAS/Shunts: The atrial septum is grossly normal.  LEFT VENTRICLE PLAX 2D LVIDd:         3.50 cm   Diastology LVIDs:         2.30 cm   LV e' medial:    5.00 cm/s LV PW:         1.30 cm   LV E/e' medial:  18.2 LV IVS:        1.80 cm   LV e' lateral:   7.07 cm/s LVOT diam:     2.00 cm   LV E/e' lateral: 12.8 LV SV:         99 LV SV Index:   58 LVOT Area:  3.14 cm  RIGHT VENTRICLE RV Basal diam:  2.80 cm RV Mid diam:    2.90 cm RV S prime:     16.80 cm/s TAPSE (M-mode): 2.2 cm LEFT ATRIUM             Index        RIGHT ATRIUM           Index LA diam:        3.70 cm 2.19 cm/m   RA Area:     14.00 cm LA Vol (A2C):   45.1 ml  26.71 ml/m  RA Volume:   30.20 ml  17.88 ml/m LA Vol (A4C):   46.1 ml 27.30 ml/m LA Biplane Vol: 45.6 ml 27.00 ml/m  AORTIC VALVE                     PULMONIC VALVE AV Area (Vmax):    3.09 cm      PV Vmax:       1.02 m/s AV Area (Vmean):   3.40 cm      PV Peak grad:  4.2 mmHg AV Area (VTI):     3.12 cm AV Vmax:           182.00 cm/s AV Vmean:          110.000 cm/s AV VTI:            0.316 m AV Peak Grad:      13.2 mmHg AV Mean Grad:      6.5 mmHg LVOT Vmax:         179.00 cm/s LVOT Vmean:        119.000 cm/s LVOT VTI:          0.314 m LVOT/AV VTI ratio: 0.99  AORTA Ao Root diam: 2.80 cm MITRAL VALVE MV Area (PHT): 3.08 cm     SHUNTS MV Area VTI:   2.49 cm     Systemic VTI:  0.31 m MV Peak grad:  14.1 mmHg    Systemic Diam: 2.00 cm MV Mean grad:  6.0 mmHg MV Vmax:       1.88 m/s MV Vmean:      108.0 cm/s MV Decel Time: 246 msec MV E velocity: 90.80 cm/s MV A velocity: 137.00 cm/s MV E/A ratio:  0.66 Eleonore Chiquito MD Electronically signed by Eleonore Chiquito MD Signature Date/Time: 10/06/2021/4:20:52 PM    Final     Microbiology: Recent Results (from the past 240 hour(s))  Resp Panel by RT-PCR (Flu A&B, Covid) Nasopharyngeal Swab     Status: None   Collection Time: 10/05/21  9:24 PM   Specimen: Nasopharyngeal Swab; Nasopharyngeal(NP) swabs in vial transport medium  Result Value Ref Range Status   SARS Coronavirus 2 by RT PCR NEGATIVE NEGATIVE Final    Comment: (NOTE) SARS-CoV-2 target nucleic acids are NOT DETECTED.  The SARS-CoV-2 RNA is generally detectable in upper respiratory specimens during the acute phase of infection. The lowest concentration of SARS-CoV-2 viral copies this assay can detect is 138 copies/mL. A negative result does not preclude SARS-Cov-2 infection and should not be used as the sole basis for treatment or other patient management decisions. A negative result may occur with  improper specimen collection/handling, submission of specimen other than nasopharyngeal swab,  presence of viral mutation(s) within the areas targeted by this assay, and inadequate number of viral copies(<138 copies/mL). A negative result must be combined with clinical observations, patient history, and epidemiological information. The expected result is Negative.  Fact Sheet for Patients:  EntrepreneurPulse.com.au  Fact Sheet for Healthcare Providers:  IncredibleEmployment.be  This test is no t yet approved or cleared by the [place] FDA and  has been authorized for detection and/or diagnosis of SARS-CoV-2 by FDA under an Emergency Use Authorization (EUA). This EUA will remain  in effect (meaning this test can be used) for the duration of the COVID-19 declaration under Section 564(b)(1) of the Act, 21 U.S.C.section 360bbb-3(b)(1), unless the authorization is terminated  or revoked sooner.       Influenza A by PCR NEGATIVE NEGATIVE Final   Influenza B by PCR NEGATIVE NEGATIVE Final    Comment: (NOTE) The Xpert Xpress SARS-CoV-2/FLU/RSV plus assay is intended as an aid in the diagnosis of influenza from Nasopharyngeal swab specimens and should not be used as a sole basis for treatment. Nasal washings and aspirates are unacceptable for Xpert Xpress SARS-CoV-2/FLU/RSV testing.  Fact Sheet for Patients: EntrepreneurPulse.com.au  Fact Sheet for Healthcare Providers: IncredibleEmployment.be  This test is not yet approved or cleared by the [place] FDA and has been authorized for detection and/or diagnosis of SARS-CoV-2 by FDA under an Emergency Use Authorization (EUA). This EUA will remain in effect (meaning this test can be used) for the duration of the COVID-19 declaration under Section 564(b)(1) of the Act, 21 U.S.C. section 360bbb-3(b)(1), unless the authorization is terminated or revoked.  Performed at [facility], [address] [place], [place] [address]      Labs: Basic  Metabolic Panel: Recent Labs  Lab 10/05/21 2103 10/06/21 0203 10/07/21 0626  NA 132* 134* 139  K 3.9 3.5 4.1  CL 104 104 111  CO2 20* 22 21*  GLUCOSE 125* 112* 85  BUN 27* 24* 18  CREATININE 1.25* 1.14* 0.91  CALCIUM 8.0* 7.4* 8.0*   Liver Function Tests: Recent Labs  Lab 10/05/21 2103 10/06/21 0203  AST 18 17  ALT 12 10  ALKPHOS 56 50  BILITOT 0.5 0.4  PROT 6.3* 5.5*  ALBUMIN 3.1* 2.8*   No results for input(s): LIPASE, AMYLASE in the last 168 hours. No results for input(s): AMMONIA in the last 168 hours. CBC: Recent Labs  Lab 10/05/21 2103 10/06/21 0203  WBC 18.1* 12.2*  NEUTROABS 15.3*  --   HGB 12.9 11.0*  HCT 44.9 37.8  MCV 80.2 81.8  PLT 358 279   Cardiac Enzymes: No results for input(s): CKTOTAL, CKMB, CKMBINDEX, TROPONINI in the last 168 hours. BNP: Invalid input(s): POCBNP CBG: Recent Labs  Lab 10/05/21 2036 10/05/21 2102  GLUCAP 142* 129*    Time coordinating discharge:  36 minutes  Signed:  Orson Eva, DO [facility] Pager: [phone] 10/08/2021, 2:30 PM

## 2021-10-09 ENCOUNTER — Other Ambulatory Visit: Payer: Self-pay | Admitting: Nurse Practitioner

## 2021-10-09 ENCOUNTER — Telehealth: Payer: Self-pay

## 2021-10-09 NOTE — Telephone Encounter (Signed)
Transition Care Management Follow-up Telephone Call Date of discharge and from where: [facility] 10/08/21 Diagnosis: TIA How have you been since you were released from the hospital? She feels great - she didn't even know she was having a stroke - her daughter noticed a slight change in her speech and she had a bad headache is what led her to ER Any questions or concerns? No  Items Reviewed: Did the pt receive and understand the discharge instructions provided? Yes  Medications obtained and verified? Yes  Other? No  Any new allergies since your discharge? No  Dietary orders reviewed? Yes Do you have support at home? Yes   Home Care and Equipment/Supplies: Were home health services ordered? no  Were any new equipment or medical supplies ordered?  No  Functional Questionnaire: (I = Independent and D = Dependent) ADLs: I  Bathing/Dressing- I  Meal Prep- I  Eating- I  Maintaining continence- I  Transferring/Ambulation- I  Managing Meds- I  Follow up appointments reviewed:  PCP Hospital f/u appt confirmed? Yes  Scheduled to see Onyeje on 10/15/21 @ 11. [facility] f/u appt confirmed? Yes  Scheduled to see Coletta Memos, NP with Dr [name], Cardiology on 10/23/21 @ 1:30. Are transportation arrangements needed? No  If their condition worsens, is the pt aware to call PCP or go to the Emergency Dept.? Yes Was the patient provided with contact information for the PCP's office or ED? Yes Was to pt encouraged to call back with questions or concerns? Yes

## 2021-10-15 ENCOUNTER — Other Ambulatory Visit: Payer: Self-pay

## 2021-10-15 ENCOUNTER — Encounter: Payer: Self-pay | Admitting: Nurse Practitioner

## 2021-10-15 ENCOUNTER — Ambulatory Visit (INDEPENDENT_AMBULATORY_CARE_PROVIDER_SITE_OTHER): Payer: 59 | Admitting: Nurse Practitioner

## 2021-10-15 VITALS — BP 127/72 | HR 84 | Temp 97.5°F | Ht 65.5 in | Wt 137.0 lb

## 2021-10-15 DIAGNOSIS — Z09 Encounter for follow-up examination after completed treatment for conditions other than malignant neoplasm: Secondary | ICD-10-CM | POA: Diagnosis not present

## 2021-10-15 DIAGNOSIS — Z79899 Other long term (current) drug therapy: Secondary | ICD-10-CM | POA: Diagnosis not present

## 2021-10-15 DIAGNOSIS — I214 Non-ST elevation (NSTEMI) myocardial infarction: Secondary | ICD-10-CM | POA: Diagnosis not present

## 2021-10-15 DIAGNOSIS — E782 Mixed hyperlipidemia: Secondary | ICD-10-CM

## 2021-10-15 DIAGNOSIS — M1712 Unilateral primary osteoarthritis, left knee: Secondary | ICD-10-CM

## 2021-10-15 DIAGNOSIS — Z8673 Personal history of transient ischemic attack (TIA), and cerebral infarction without residual deficits: Secondary | ICD-10-CM

## 2021-10-15 HISTORY — DX: Encounter for follow-up examination after completed treatment for conditions other than malignant neoplasm: Z09

## 2021-10-15 MED ORDER — TRAMADOL HCL 50 MG PO TABS: 50.0000 mg | ORAL_TABLET | Freq: Two times a day (BID) | ORAL | 1 refills | Status: AC

## 2021-10-15 MED ORDER — EZETIMIBE 10 MG PO TABS: 10.0000 mg | ORAL_TABLET | Freq: Every day | ORAL | 1 refills | Status: DC

## 2021-10-15 MED ORDER — ESCITALOPRAM OXALATE 20 MG PO TABS: 20.0000 mg | ORAL_TABLET | Freq: Every day | ORAL | 1 refills | Status: DC

## 2021-10-15 MED ORDER — FENOFIBRATE 145 MG PO TABS: 145.0000 mg | ORAL_TABLET | Freq: Every day | ORAL | 0 refills | Status: DC

## 2021-10-15 NOTE — Assessment & Plan Note (Signed)
Neuro assessment completed on this visit, symptoms resolved.  Education provided to monitor for future symptoms of stroke.  Patient verbalized understanding.  Medication refill completed

## 2021-10-15 NOTE — Progress Notes (Signed)
Established Patient Office Visit  Subjective:  Patient ID: [name], female    DOB: 1951-03-21  Age: 70 y.o. MRN: [id number]  CC:  Chief Complaint  Patient presents with   Transitions Of Care    HPI [name] presents for Today's visit was for Transitional Care Management.  The patient was discharged from [facility] on 10/08/2021 with a primary diagnosis of TIA.   Contact with the patient and/or caregiver, by a clinical staff member, was made on 10/09/2021 and was documented as a telephone encounter within the EMR.  Through chart review and discussion with the patient I have determined that management of their condition is of moderate complexity.    TIA follow-up visit Patient presented to the emergency department with a chief complaint of numbness and weakness and blurry vision.  Patient's daughter also noticed left facial droop, slurred speech and reported that these symptoms happened about a week ago.  Patient did not receive tPA in the emergency department as her symptoms started resolving, CT of head was negative for acute intracranial abnormality, MRI of the brain showed tiny acute to subacute infarct in the right centrum semiovale.  Neurology was consulted.  Patient was diagnosed with acute ischemic CVA.  Patient was treated and discharged home to follow-up with neurology, home health SLP, diet as per SLP recommendations, continue Eliquis along with aspirin.  Patient was also advised to hold diltiazem and chlorthalidone to allow permissive hypertension. Patient in clinic today with symptoms resolved.  Completed all hospital discharge instructions.   Past Medical History:  Diagnosis Date   Arthritis    "fingers, some in my knees" (01/28/2016)   CAD (coronary artery disease), per cath 04/27/13, non obstructive disease, EF 65-70% 05/11/2013   Carotid disease, bilateral ([place]) 09/2013   Lt carotid 50-69% stentosis, less on rt   Dysrhythmia    Fatty liver    GERD  (gastroesophageal reflux disease)    Glaucoma    H/O cardiovascular stress test 12/27/2009   negative for ischemia   Headache    "occasional since eye pressure regulated" (01/28/2016)   Hyperlipidemia    Hypertension    Leukocytosis 07/15/2015   Migraine    "stopped w/laser holes to relieve pressure in my eyes; had them 3-4 times/wk before taht" (01/28/2016)   Mitral regurgitation,2+ by cath 05/11/2013   NSTEMI (non-ST elevated myocardial infarction) ([place]) 04/27/2013   Pain    BOTH KNEES - PT HAS TORN MENISCUS LEFT KNEE   Paroxysmal atrial fibrillation (HCC)    hx of a fib on ASA  AND ELIQUIS    Polycythemia vera ([place]) 08/20/2015   JAK-2 positive 07/19/15   PVD (peripheral vascular disease) with claudication ([place]) 07/2006   previous L ext iliac stent and rt SFA stent, known occluded Lt SFA   S/P cardiac cath 04/27/13   NON OBSTRUCTIVE DISEASE, 2+ mr, ef 65-70%   Statin intolerance    Stroke ([place]) 2006   SWELLING ALL OVER AND RT SIDE OF FACE DRAWN AND SPEECH SLURRED AND NUMBNESS ON RIGHT SIDE-- ALL RESOLVED    Past Surgical History:  Procedure Laterality Date   AMPUTATION Right 05/21/2017   Procedure: AMPUTATION ABOVE KNEE;  Surgeon: Rosetta Posner, MD;  Location: [place] OR;  Service: Vascular;  Laterality: Right;   CARDIAC CATHETERIZATION  04/27/13   non occlusive disease with mild to mod. calcified lesions in ostial LM and proximal LAD and moderate prox. RC AND DISTAL AV GROOVE LCX, ef   CATARACT EXTRACTION  W/PHACO Right 03/21/2015   Procedure: CATARACT EXTRACTION PHACO AND INTRAOCULAR LENS PLACEMENT RIGHT EYE;  Surgeon: Baruch Goldmann, MD;  Location: [facility];  Service: Ophthalmology;  Laterality: Right;  CDE:5.10   CATARACT EXTRACTION W/PHACO Left 05/16/2015   Procedure: CATARACT EXTRACTION PHACO AND INTRAOCULAR LENS PLACEMENT (IOC);  Surgeon: Baruch Goldmann, MD;  Location: [facility];  Service: Ophthalmology;  Laterality: Left;  CDE 5.57   CESAREAN SECTION  1977   COLONOSCOPY N/A 09/11/2016   Procedure:  COLONOSCOPY;  Surgeon: Rogene Houston, MD;  [facility];  Service: Endoscopy;  Laterality: N/A;  730-moved to 1:00 Ann notified pt   EMBOLECTOMY Right 02/01/2016   Procedure: Thrombectomy of Right Femoral-Popliteal Bypass Graft; Endarterectomy of Right Below Knee Popliteal Artery and Tibial Peroneal Trunk with Bovine Pericardium Patch Angioplasty.;  Surgeon: Angelia Mould, MD;  Location: [place];  Service: Vascular;  Laterality: Right;   FEMORAL-POPLITEAL BYPASS GRAFT Right 01/31/2016   Procedure: BYPASS GRAFT RIGHT COMMON  FEMORALTO BELOW KNEE POPLITEAL ARTERY BYPASS GRAFT USING 6MM PROPATEN GORTEX GRAFT;  Surgeon: Mal Misty, MD;  Location: [place];  Service: Vascular;  Laterality: Right;   FEMORAL-POPLITEAL BYPASS GRAFT Right 01/09/2017   Procedure: THROMBECTOMY OF RIGHT FEMORAL-POPLITEAL  ARTERY BYPASS GRAFT; THROMBECTOMY RIGHT  TIBIAL VESSELS;  Surgeon: [name], MD;  Location: [place];  Service: Vascular;  Laterality: Right;   FEMORAL-POPLITEAL BYPASS GRAFT Right 05/17/2017   Procedure: RIGHT FEMORAL-TIBIAL PERONEAL TRUNK ARTERY BYPASS GRAFT USING 29mmX80cm PROPATEN GRAFT WITH REMOVABLE RING;  Surgeon: Rosetta Posner, MD;  Location: [place];  Service: Vascular;  Laterality: Right;   FRACTURE SURGERY     ILIAC ARTERY STENT  07/2006   external iliac and Rt SFA stent 07/2006 AND THE LEFT WAS IN 2006   KNEE ARTHROSCOPY WITH MEDIAL MENISECTOMY Left 03/27/2014   Procedure: left knee arthorscopy with medial chondraplasty of the medial femoral and patella, medial microfracture technique of medial femoral condyl;  Surgeon: Tobi Bastos, MD;  Location: [facility];  Service: Orthopedics;  Laterality: Left;   LEFT HEART CATHETERIZATION WITH CORONARY ANGIOGRAM Right 04/27/2013   Procedure: LEFT HEART CATHETERIZATION WITH CORONARY ANGIOGRAM;  Surgeon: Leonie Man, MD;  [facility] CATH LAB;  Service: Cardiovascular;  Laterality: Right;   LOWER EXTREMITY ANGIOGRAM Right 01/31/2016    Procedure: INTRAOP RIGHT LOWER EXTREMITY ANGIOGRAM;  Surgeon: Mal Misty, MD;  Location: [place];  Service: Vascular;  Laterality: Right;   ORIF WRIST FRACTURE Right 1983   OVARIAN CYST REMOVAL Right    PATCH ANGIOPLASTY Right 01/31/2016   Procedure: RIGHT COMMON FEMORAL AND PROFUNDA FEMORIS ENDARECTOMY WITH PATCH ANGIOPLASTY;  Surgeon: Mal Misty, MD;  Location: [place];  Service: Vascular;  Laterality: Right;   PATCH ANGIOPLASTY Right 01/09/2017   Procedure: PATCH ANGIOPLASTY RIGHT POPLITEAL ARTERY BYPASS GRAFT;  Surgeon: [name], MD;  Location: [place];  Service: Vascular;  Laterality: Right;   PERIPHERAL VASCULAR CATHETERIZATION N/A 06/27/2015   Procedure: Lower Extremity Angiography;  Surgeon: Lorretta Harp, MD;  Location: [place] CV LAB;  Service: Cardiovascular;  Laterality: N/A;   PERIPHERAL VASCULAR CATHETERIZATION Bilateral 01/29/2016   Procedure: Lower Extremity Angiography;  Surgeon: Wellington Hampshire, MD;  Location: [place] CV LAB;  Service: Cardiovascular;  Laterality: Bilateral;   PERIPHERAL VASCULAR CATHETERIZATION N/A 01/29/2016   Procedure: Abdominal Aortogram;  Surgeon: Wellington Hampshire, MD;  Location: [place] CV LAB;  Service: Cardiovascular;  Laterality: N/A;   REFRACTIVE SURGERY Bilateral    "  6 in one eye; 7 in the other; to relieve pressure; not glaucoma" (01/28/2016)   SFA Right 06/27/2015   overlapping Bahn covered stents   Saks Left 05/17/2017   Procedure: LEFT LEG GREATER SAPHENOUS VEIN HARVEST;  Surgeon: Rosetta Posner, MD;  Location[facility];  Service: Vascular;  Laterality: Left;   VEIN REPAIR Right 01/31/2016   Procedure: RIGHT GREATER SAPHENOUS VEIN EXAMNED BUT NOT REMOVED;  Surgeon: Mal Misty, MD;  Location: [facility] OR;  Service: Vascular;  Laterality: Right;    Family History  Problem Relation Age of Onset   CAD Mother    Lung cancer Mother    Heart attack Mother    CAD Father    Heart disease Father    Heart  attack Father 76       died in hes 51's with heart disease   Healthy Sister    Liver cancer Brother    Healthy Sister    CAD Brother        has had CABG   CAD Brother     Social History   Socioeconomic History   Marital status: Widowed    Spouse name: Not on file   Number of children: 2   Years of education: 58   Highest education level: Not on file  Occupational History   Occupation: CNA  Tobacco Use   Smoking status: Former    Packs/day: 1.00    Years: 45.00    Pack years: 45.00    Types: Cigarettes    Quit date: 11/11/2012    Years since quitting: 8.9   Smokeless tobacco: Never   Tobacco comments:    quit 4 years ago  Vaping Use   Vaping Use: Never used  Substance and Sexual Activity   Alcohol use: No    Alcohol/week: 0.0 standard drinks   Drug use: No   Sexual activity: Not Currently    Birth control/protection: None  Other Topics Concern   Not on file  Social History Narrative   Not on file   Social Determinants of Health   Financial Resource Strain: Not on file  Food Insecurity: Not on file  Transportation Needs: Not on file  Physical Activity: Not on file  Stress: Not on file  Social Connections: Not on file  Intimate Partner Violence: Not on file    Outpatient Medications Prior to Visit  Medication Sig Dispense Refill   acetaminophen (TYLENOL) 500 MG tablet Take 2 tablets (1,000 mg total) by mouth every 6 (six) hours. 30 tablet 0   ARTIFICIAL TEAR OP Place 1 drop into both eyes as needed (for dry eyes).     aspirin EC 325 MG tablet Take 325 mg by mouth daily.     calcium-vitamin D (OSCAL WITH D) 500-200 MG-UNIT tablet Take 1 tablet by mouth daily with breakfast. 90 tablet 1   chlorthalidone (HYGROTON) 25 MG tablet Take 1 tablet (25 mg total) by mouth daily. 30 tablet 5   diclofenac Sodium (VOLTAREN) 1 % GEL Apply 2 g topically 4 (four) times daily. 50 g 0   diltiazem (CARDIZEM CD) 240 MG 24 hr capsule TAKE 1 CAPSULE EVERY MORNING (Patient taking  differently: Take 240 mg by mouth daily.) 90 capsule 0   ELIQUIS 5 MG TABS tablet TAKE ONE TABLET TWICE DAILY 60 tablet 2   flecainide (TAMBOCOR) 50 MG tablet TAKE 1 TABLET 2 TIMES A DAY 60 tablet 1   hydroxyurea (HYDREA) 500 MG  capsule Take 1 capsule (500 mg total) by mouth daily. Take one capsule daily except Sundays. May take with food to minimize GI side effects. 30 capsule 3   omeprazole (PRILOSEC) 20 MG capsule TAKE 1 CAPSULE 2 TIMES A DAY BEFORE A MEAL (Patient taking differently: Take 20 mg by mouth 2 (two) times daily before a meal.) 180 capsule 0   valsartan (DIOVAN) 160 MG tablet TAKE ONE TABLET ONCE DAILY (Patient taking differently: Take 160 mg by mouth daily.) 60 tablet 4   escitalopram (LEXAPRO) 20 MG tablet TAKE 1 TABLET DAILY 30 tablet 1   ezetimibe (ZETIA) 10 MG tablet TAKE 1 TABLET DAILY 30 tablet 1   fenofibrate (TRICOR) 145 MG tablet Take 1 tablet (145 mg total) by mouth daily. (NEEDS TO BE SEEN BEFORE NEXT REFILL) 30 tablet 0   fenofibrate (TRICOR) 145 MG tablet Take 1 tablet (145 mg total) by mouth daily. 30 tablet 1   traMADol (ULTRAM) 50 MG tablet Take 1 tablet (50 mg total) by mouth 2 (two) times daily. 60 tablet 0   No facility-administered medications prior to visit.    Allergies  Allergen Reactions   Atenolol Rash and Itching   Demerol  [Meperidine Hcl] Itching   Demerol [Meperidine] Other (See Comments) and Itching    Unknown, can't remember  Unknown, can't remember    Gabapentin Anxiety and Other (See Comments)    SI and HI   Inderal [Propranolol] Other (See Comments) and Itching    Doesn't remember    Prednisone Other (See Comments)    Muscle spasms   Statins Itching and Other (See Comments)    Simvastatin-caused severe itching Pravastatin-caused lesser tiching One type of statin? Caused stroke in 2006, and other symptoms as result   Warfarin And Related Other (See Comments)    Caused nose bleeds   Crestor [Rosuvastatin] Itching and Rash    Docosahexaenoic Acid (Dha)     Other reaction(s): Other (See Comments) nosebleeds   Lactose Intolerance (Gi) Other (See Comments)    Bloating, gas   Lipitor [Atorvastatin] Rash   Lovaza [Omega-3-Acid Ethyl Esters] Other (See Comments)    nosebleeds    Penicillins Hives, Rash and Other (See Comments)    Has patient had a PCN reaction causing immediate rash, facial/tongue/throat swelling, SOB or lightheadedness with hypotension: Yes Has patient had a PCN reaction causing severe rash involving mucus membranes or skin necrosis: No Has patient had a PCN reaction that required hospitalization: No Has patient had a PCN reaction occurring within the last 10 years: No If all of the above answers are "NO", then may proceed with Cephalosporin use.  Questionable high fever    Pravastatin Itching and Rash   Simvastatin Itching, Rash and Other (See Comments)   Trilipix [Choline Fenofibrate] Other (See Comments)    Doesn't remember    Warfarin Other (See Comments)    nosebleeds   Hydralazine Swelling   Effexor [Venlafaxine Hcl] Other (See Comments)    Doesn't remember    Nexium [Esomeprazole Magnesium] Rash   Percocet [Oxycodone-Acetaminophen] Other (See Comments)    Doesn't remember    Plavix [Clopidogrel Bisulfate] Rash    ROS Review of Systems  Constitutional: Negative.   HENT: Negative.    Eyes: Negative.   Respiratory: Negative.    Cardiovascular: Negative.   Gastrointestinal: Negative.   Skin:  Negative for rash.  All other systems reviewed and are negative.    Objective:    Physical Exam Vitals and nursing note reviewed.  Constitutional:      Appearance: Normal appearance.  HENT:     Head: Normocephalic.     Right Ear: External ear normal.     Left Ear: External ear normal.     Nose: Nose normal.  Eyes:     Conjunctiva/sclera: Conjunctivae normal.  Cardiovascular:     Rate and Rhythm: Normal rate and regular rhythm.     Pulses: Normal pulses.     Heart sounds:  Normal heart sounds.  Pulmonary:     Effort: Pulmonary effort is normal.     Breath sounds: Normal breath sounds.  Abdominal:     General: Bowel sounds are normal.  Skin:    General: Skin is warm.     Findings: No rash.  Neurological:     General: No focal deficit present.     Mental Status: She is alert and oriented to person, place, and time. Mental status is at baseline.     Sensory: Sensation is intact.     Gait: Gait is intact.  Psychiatric:        Mood and Affect: Mood normal.        Behavior: Behavior normal.    BP 127/72   Pulse 84   Temp (!) 97.5 F (36.4 C) (Temporal)   Ht 5' 5.5" (1.664 m)   Wt 137 lb (62.1 kg)   SpO2 96%   BMI 22.45 kg/m  Wt Readings from Last 3 Encounters:  10/15/21 137 lb (62.1 kg)  10/05/21 136 lb (61.7 kg)  09/10/21 134 lb (60.8 kg)     Health Maintenance Due  Topic Date Due   Hepatitis C Screening  Never done    There are no preventive care reminders to display for this patient.  Lab Results  Component Value Date   TSH 1.550 06/28/2020   Lab Results  Component Value Date   WBC 12.2 (H) 10/06/2021   HGB 11.0 (L) 10/06/2021   HCT 37.8 10/06/2021   MCV 81.8 10/06/2021   PLT 279 10/06/2021   Lab Results  Component Value Date   NA 139 10/07/2021   K 4.1 10/07/2021   CO2 21 (L) 10/07/2021   GLUCOSE 85 10/07/2021   BUN 18 10/07/2021   CREATININE 0.91 10/07/2021   BILITOT 0.4 10/06/2021   ALKPHOS 50 10/06/2021   AST 17 10/06/2021   ALT 10 10/06/2021   PROT 5.5 (L) 10/06/2021   ALBUMIN 2.8 (L) 10/06/2021   CALCIUM 8.0 (L) 10/07/2021   ANIONGAP 7 10/07/2021   Lab Results  Component Value Date   CHOL 160 10/06/2021   Lab Results  Component Value Date   HDL 26 (L) 10/06/2021   Lab Results  Component Value Date   LDLCALC 79 10/06/2021   Lab Results  Component Value Date   TRIG 273 (H) 10/06/2021   Lab Results  Component Value Date   CHOLHDL 6.2 10/06/2021   Lab Results  Component Value Date   HGBA1C 4.8  10/06/2021      Assessment & Plan:   Problem List Items Addressed This Visit       Cardiovascular and Mediastinum   NSTEMI (non-ST elevated myocardial infarction) ([place])   Relevant Medications   ezetimibe (ZETIA) 10 MG tablet   fenofibrate (TRICOR) 145 MG tablet     Musculoskeletal and Integument   Osteoarthritis of left knee   Relevant Medications   fenofibrate (TRICOR) 145 MG tablet   traMADol (ULTRAM) 50 MG tablet     Other   Hyperlipidemia  Relevant Medications   ezetimibe (ZETIA) 10 MG tablet   fenofibrate (TRICOR) 145 MG tablet   History of transient ischemic attack (TIA) - Primary    Neuro assessment completed on this visit, symptoms resolved.  Education provided to monitor for future symptoms of stroke.  Patient verbalized understanding.  Medication refill completed      Relevant Medications   ezetimibe (ZETIA) 10 MG tablet   Other Relevant Orders   Basic Metabolic Panel   CBC with Differential   Hospital discharge follow-up    Within all hospital discharge instructions.  With medication reconciliation.  Patient knows to keep neurology appointment.  Completed CBC and BMP .      Relevant Orders   Basic Metabolic Panel   CBC with Differential   Other Visit Diagnoses     Controlled substance agreement signed       Relevant Medications   traMADol (ULTRAM) 50 MG tablet   Other Relevant Orders   ToxASSURE Select 13 (MW), Urine   History of CVA (cerebrovascular accident)       Relevant Medications   ezetimibe (ZETIA) 10 MG tablet       Meds ordered this encounter  Medications   ezetimibe (ZETIA) 10 MG tablet    Sig: Take 1 tablet (10 mg total) by mouth daily.    Dispense:  30 tablet    Refill:  1    Order Specific Question:   Supervising Provider    Answer:   Claretta Fraise [[id number]]   fenofibrate (TRICOR) 145 MG tablet    Sig: Take 1 tablet (145 mg total) by mouth daily. (NEEDS TO BE SEEN BEFORE NEXT REFILL)    Dispense:  30 tablet    Refill:  0     Order Specific Question:   Supervising Provider    Answer:   Claretta Fraise [[id number]]   escitalopram (LEXAPRO) 20 MG tablet    Sig: Take 1 tablet (20 mg total) by mouth daily.    Dispense:  30 tablet    Refill:  1    Order Specific Question:   Supervising Provider    Answer:   Claretta Fraise [[id number]]   traMADol (ULTRAM) 50 MG tablet    Sig: Take 1 tablet (50 mg total) by mouth 2 (two) times daily for 5 days.    Dispense:  60 tablet    Refill:  1    Order Specific Question:   Supervising Provider    Answer:   Claretta Fraise [[id number]]    Follow-up: Return in about 3 months (around 01/15/2022).    Ivy Lynn, NP

## 2021-10-15 NOTE — Assessment & Plan Note (Signed)
Within all hospital discharge instructions.  With medication reconciliation.  Patient knows to keep neurology appointment.  Completed CBC and BMP .

## 2021-10-15 NOTE — Patient Instructions (Signed)
Transient Ischemic Attack °A transient ischemic attack (TIA) causes the same symptoms as a stroke, but the symptoms go away quickly. A TIA happens when blood flow to the brain is blocked. Having a TIA means you may be at risk for a stroke. A TIA is a medical emergency. °What are the causes? °A TIA is caused by a blocked artery in the head or neck. This means the brain does not get the blood supply it needs. A blockage can be caused by: °Fatty buildup in an artery in the head or neck. °A blood clot. °A tear in an artery. °Irritation and swelling (inflammation) of an artery. °Sometimes the cause is not known. °What increases the risk? °Certain things may make you more likely to have a TIA. Some of these are things that you can change, such as: °Being very overweight. °Using products that have nicotine or tobacco. °Taking birth control pills. °Not being active. °Drinking too much alcohol. °Using drugs. °Health conditions that may increase your risk include: °High blood pressure. °High cholesterol. °Diabetes. °Heart disease. °A heartbeat that is not regular (atrial fibrillation). °Sickle cell disease. °Sleep problems (sleep apnea). °Long-term diseases that cause irritation and swelling. °Problems with blood clotting. °Other risk factors include: °Being over the age of 60. °Being female. °Having a family history of stroke. °Having had blood clots, stroke, TIA, or heart attack in the past. °Having a history of high blood pressure when pregnant (preeclampsia). °Very bad headaches (migraines). °What are the signs or symptoms? °The symptoms of a TIA are like those of a stroke. They can include: °Weakness or loss of feeling in your face, arm, or leg. This often happens on one side of your body. °Trouble walking. °Trouble moving your arms or legs. °Trouble talking or understanding what people are saying. °Problems with how you see. °Feeling dizzy. °Feeling confused. °Loss of balance or coordination. °Feeling like you may vomit  (nausea) or you vomit. °Having a very bad headache. °If you can, note what time you started to have symptoms. Tell your doctor. °How is this treated? °The goal of treatment is to lower the risk for a stroke. This may include: °Changes to diet and lifestyle, such as getting regular exercise and stopping smoking. °Taking medicines to: °Thin the blood. °Lower blood pressure. °Lower cholesterol. °Treating other health conditions, such as diabetes. °If testing shows that an artery in your brain is narrow, your doctor may recommend a procedure to: °Take the blockage out of your artery (carotid endarterectomy). °Open or widen an artery in your neck (carotid angioplasty and stenting). °Follow these instructions at home: °Medicines °Take over-the-counter and prescription medicines only as told by your doctor. °If you were told to take aspirin or another medicine to thin your blood, take it exactly as told by your doctor. °Taking too much of the medicine can cause bleeding. °Taking too little of the medicine may not work to treat the problem. °Eating and drinking ° °Eat 5 or more servings of fruits and vegetables each day. °Follow instructions from your doctor about your diet. You may need to follow a certain diet to help lower your risk of a stroke. You may need to: °Eat a diet that is low in fat and salt. °Eat foods with a lot of fiber. °Limit carbohydrates and sugar. °If you drink alcohol: °Limit how much you have to: °0-1 drink a day for women who are not pregnant. °0-2 drinks a day for men. °Know how much alcohol is in a drink. In the   [place] one drink equals one 12 oz bottle of beer (355mL), one 5 oz glass of wine (148mL), or one 1½ oz glass of hard liquor (44mL). °General instructions °Keep a healthy weight. °Try to get at least 30 minutes of exercise on most days. °Get treatment if you have sleep problems. °Do not smoke or use any products that contain nicotine or tobacco. If you need help quitting, ask your doctor. °Do  not use drugs. °Keep all follow-up visits. °Where to find more information °American Stroke Association: www.stroke.org °Get help right away if: °You have chest pain. °You have a heartbeat that is not regular. °You have any signs of a stroke. "BE FAST" is an easy way to remember the main warning signs: °B - Balance. Dizziness, sudden trouble walking, or loss of balance. °E - Eyes. Trouble seeing or a change in how you see. °F - Face. Sudden weakness or loss of feeling of the face. The face or eyelid may droop on one side. °A - Arms. Weakness or loss of feeling in an arm. This happens all of a sudden and most often on one side of the body. °S - Speech. Sudden trouble speaking, slurred speech, or trouble understanding what people say. °T - Time. Time to call emergency services. Write down what time symptoms started. °You have other signs of a stroke, such as: °A sudden, very bad headache with no known cause. °Feeling like you may vomit. °Vomiting. °A seizure. °These symptoms may be an emergency. Get help right away. Call your local emergency services (911 in the [place] °Do not wait to see if the symptoms will go away. °Do not drive yourself to the hospital. °Summary °A transient ischemic attack (TIA) happens when an artery in the head or neck is blocked. This causes the same symptoms as a stroke. The symptoms go away quickly. °A TIA is a medical emergency. Get help right away, even if your symptoms go away. °Having a TIA means that you may be at risk for a stroke. Taking medicines and making diet and lifestyle changes can help to prevent a stroke. °This information is not intended to replace advice given to you by your health care provider. Make sure you discuss any questions you have with your health care provider. °Document Revised: 07/02/2020 Document Reviewed: 07/02/2020 °Elsevier Patient Education © 2022 [employer] ° °

## 2021-10-16 LAB — CBC WITH DIFFERENTIAL/PLATELET
Basophils Absolute: 0.2 10*3/uL (ref 0.0–0.2)
Basos: 2 %
EOS (ABSOLUTE): 0.4 10*3/uL (ref 0.0–0.4)
Eos: 3 %
Hematocrit: 39.5 % (ref 34.0–46.6)
Hemoglobin: 11.8 g/dL (ref 11.1–15.9)
Immature Grans (Abs): 0.2 10*3/uL — ABNORMAL HIGH (ref 0.0–0.1)
Immature Granulocytes: 1 %
Lymphocytes Absolute: 1 10*3/uL (ref 0.7–3.1)
Lymphs: 8 %
MCH: 22.6 pg — ABNORMAL LOW (ref 26.6–33.0)
MCHC: 29.9 g/dL — ABNORMAL LOW (ref 31.5–35.7)
MCV: 76 fL — ABNORMAL LOW (ref 79–97)
Monocytes Absolute: 0.3 10*3/uL (ref 0.1–0.9)
Monocytes: 2 %
Neutrophils Absolute: 11.5 10*3/uL — ABNORMAL HIGH (ref 1.4–7.0)
Neutrophils: 84 %
Platelets: 411 10*3/uL (ref 150–450)
RBC: 5.21 x10E6/uL (ref 3.77–5.28)
RDW: 19.2 % — ABNORMAL HIGH (ref 11.7–15.4)
WBC: 13.6 10*3/uL — ABNORMAL HIGH (ref 3.4–10.8)

## 2021-10-16 LAB — BASIC METABOLIC PANEL
BUN/Creatinine Ratio: 26 (ref 12–28)
BUN: 27 mg/dL (ref 8–27)
CO2: 18 mmol/L — ABNORMAL LOW (ref 20–29)
Calcium: 8.6 mg/dL — ABNORMAL LOW (ref 8.7–10.3)
Chloride: 108 mmol/L — ABNORMAL HIGH (ref 96–106)
Creatinine, Ser: 1.05 mg/dL — ABNORMAL HIGH (ref 0.57–1.00)
Glucose: 93 mg/dL (ref 70–99)
Potassium: 5.3 mmol/L — ABNORMAL HIGH (ref 3.5–5.2)
Sodium: 142 mmol/L (ref 134–144)
eGFR: 57 mL/min/{1.73_m2} — ABNORMAL LOW (ref >59)

## 2021-10-17 ENCOUNTER — Inpatient Hospital Stay: Payer: 59

## 2021-10-17 ENCOUNTER — Inpatient Hospital Stay: Payer: 59 | Attending: Hematology

## 2021-10-17 ENCOUNTER — Inpatient Hospital Stay (HOSPITAL_BASED_OUTPATIENT_CLINIC_OR_DEPARTMENT_OTHER): Payer: 59 | Admitting: Hematology

## 2021-10-17 ENCOUNTER — Other Ambulatory Visit: Payer: Self-pay

## 2021-10-17 VITALS — BP 135/86 | HR 88 | Temp 98.4°F | Resp 18 | Ht 65.5 in | Wt 137.6 lb

## 2021-10-17 DIAGNOSIS — Z89611 Acquired absence of right leg above knee: Secondary | ICD-10-CM | POA: Insufficient documentation

## 2021-10-17 DIAGNOSIS — D509 Iron deficiency anemia, unspecified: Secondary | ICD-10-CM | POA: Diagnosis not present

## 2021-10-17 DIAGNOSIS — Z86718 Personal history of other venous thrombosis and embolism: Secondary | ICD-10-CM | POA: Diagnosis not present

## 2021-10-17 DIAGNOSIS — D45 Polycythemia vera: Secondary | ICD-10-CM | POA: Diagnosis present

## 2021-10-17 DIAGNOSIS — Z7901 Long term (current) use of anticoagulants: Secondary | ICD-10-CM | POA: Insufficient documentation

## 2021-10-17 DIAGNOSIS — Z79899 Other long term (current) drug therapy: Secondary | ICD-10-CM | POA: Insufficient documentation

## 2021-10-17 DIAGNOSIS — Z8673 Personal history of transient ischemic attack (TIA), and cerebral infarction without residual deficits: Secondary | ICD-10-CM | POA: Diagnosis not present

## 2021-10-17 DIAGNOSIS — Z7982 Long term (current) use of aspirin: Secondary | ICD-10-CM | POA: Diagnosis not present

## 2021-10-17 LAB — CBC WITH DIFFERENTIAL (CANCER CENTER ONLY)
Abs Immature Granulocytes: 0.2 10*3/uL — ABNORMAL HIGH (ref 0.00–0.07)
Basophils Absolute: 0.2 10*3/uL — ABNORMAL HIGH (ref 0.0–0.1)
Basophils Relative: 1 %
Eosinophils Absolute: 0.3 10*3/uL (ref 0.0–0.5)
Eosinophils Relative: 2 %
HCT: 39.1 % (ref 36.0–46.0)
Hemoglobin: 11.3 g/dL — ABNORMAL LOW (ref 12.0–15.0)
Immature Granulocytes: 1 %
Lymphocytes Relative: 6 %
Lymphs Abs: 0.8 10*3/uL (ref 0.7–4.0)
MCH: 22.8 pg — ABNORMAL LOW (ref 26.0–34.0)
MCHC: 28.9 g/dL — ABNORMAL LOW (ref 30.0–36.0)
MCV: 79 fL — ABNORMAL LOW (ref 80.0–100.0)
Monocytes Absolute: 0.3 10*3/uL (ref 0.1–1.0)
Monocytes Relative: 2 %
Neutro Abs: 13.1 10*3/uL — ABNORMAL HIGH (ref 1.7–7.7)
Neutrophils Relative %: 88 %
Platelet Count: 368 10*3/uL (ref 150–400)
RBC: 4.95 MIL/uL (ref 3.87–5.11)
RDW: 20.4 % — ABNORMAL HIGH (ref 11.5–15.5)
WBC Count: 14.9 10*3/uL — ABNORMAL HIGH (ref 4.0–10.5)
nRBC: 0.2 % (ref 0.0–0.2)

## 2021-10-17 MED ORDER — HYDROXYUREA 500 MG PO CAPS: 500.0000 mg | ORAL_CAPSULE | Freq: Every day | ORAL | 3 refills | Status: DC
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
## 2021-10-20 ENCOUNTER — Other Ambulatory Visit: Payer: Self-pay

## 2021-10-20 DIAGNOSIS — E875 Hyperkalemia: Secondary | ICD-10-CM

## 2021-10-21 ENCOUNTER — Other Ambulatory Visit: Payer: 59

## 2021-10-21 ENCOUNTER — Other Ambulatory Visit: Payer: Self-pay

## 2021-10-21 DIAGNOSIS — E875 Hyperkalemia: Secondary | ICD-10-CM

## 2021-10-21 LAB — BMP8+EGFR
BUN/Creatinine Ratio: 21 (ref 12–28)
BUN: 22 mg/dL (ref 8–27)
CO2: 19 mmol/L — ABNORMAL LOW (ref 20–29)
Calcium: 8.3 mg/dL — ABNORMAL LOW (ref 8.7–10.3)
Chloride: 106 mmol/L (ref 96–106)
Creatinine, Ser: 1.05 mg/dL — ABNORMAL HIGH (ref 0.57–1.00)
Glucose: 100 mg/dL — ABNORMAL HIGH (ref 70–99)
Potassium: 5.2 mmol/L (ref 3.5–5.2)
Sodium: 138 mmol/L (ref 134–144)
eGFR: 57 mL/min/{1.73_m2} — ABNORMAL LOW (ref >59)

## 2021-10-22 ENCOUNTER — Ambulatory Visit: Payer: 59 | Admitting: Vascular Surgery

## 2021-10-22 LAB — TOXASSURE SELECT 13 (MW), URINE

## 2021-10-23 ENCOUNTER — Ambulatory Visit: Payer: 59 | Admitting: General Practice

## 2021-10-27 ENCOUNTER — Ambulatory Visit (INDEPENDENT_AMBULATORY_CARE_PROVIDER_SITE_OTHER): Payer: 59

## 2021-10-27 VITALS — Ht 64.5 in | Wt 138.0 lb

## 2021-10-27 DIAGNOSIS — Z0000 Encounter for general adult medical examination without abnormal findings: Secondary | ICD-10-CM | POA: Diagnosis not present

## 2021-10-27 NOTE — Patient Instructions (Signed)
[name] , Thank you for taking time to come for your Medicare Wellness Visit. I appreciate your ongoing commitment to your health goals. Please review the following plan we discussed and let me know if I can assist you in the future.   Screening recommendations/referrals: Colonoscopy: Done 09/11/2016 Repeat in 10 years  Mammogram: Done 09/10/2006 Repeat annually  Bone Density: Done 06/06/2021 Repeat every 2 years  Recommended yearly ophthalmology/optometry visit for glaucoma screening and checkup Recommended yearly dental visit for hygiene and checkup  Vaccinations: Influenza vaccine: Declined Pneumococcal vaccine: Declined Tdap vaccine: Done 09/09/2018 Repeat in 10 years  Shingles vaccine: Shingrix discussed. Please contact your pharmacy for coverage information.     Covid-19:Declined  Advanced directives: Advance directive discussed with you today. I have provided a copy for you to complete at home and have notarized. Once this is complete please bring a copy in to our office so we can scan it into your chart.   Conditions/risks identified: Aim for 30 minutes of exercise or walking each day, drink 6-8 glasses of water and eat lots of fruits and vegetables.   Next appointment: Follow up in one year for your annual wellness visit 2023.   Preventive Care 31 Years and Older, Female Preventive care refers to lifestyle choices and visits with your health care provider that can promote health and wellness. What does preventive care include? A yearly physical exam. This is also called an annual well check. Dental exams once or twice a year. Routine eye exams. Ask your health care provider how often you should have your eyes checked. Personal lifestyle choices, including: Daily care of your teeth and gums. Regular physical activity. Eating a healthy diet. Avoiding tobacco and drug use. Limiting alcohol use. Practicing safe sex. Taking low-dose aspirin every day. Taking vitamin  and mineral supplements as recommended by your health care provider. What happens during an annual well check? The services and screenings done by your health care provider during your annual well check will depend on your age, overall health, lifestyle risk factors, and family history of disease. Counseling  Your health care provider may ask you questions about your: Alcohol use. Tobacco use. Drug use. Emotional well-being. Home and relationship well-being. Sexual activity. Eating habits. History of falls. Memory and ability to understand (cognition). Work and work Statistician. Reproductive health. Screening  You may have the following tests or measurements: Height, weight, and BMI. Blood pressure. Lipid and cholesterol levels. These may be checked every 5 years, or more frequently if you are over 80 years old. Skin check. Lung cancer screening. You may have this screening every year starting at age 21 if you have a 30-pack-year history of smoking and currently smoke or have quit within the past 15 years. Fecal occult blood test (FOBT) of the stool. You may have this test every year starting at age 79. Flexible sigmoidoscopy or colonoscopy. You may have a sigmoidoscopy every 5 years or a colonoscopy every 10 years starting at age 22. Hepatitis C blood test. Hepatitis B blood test. Sexually transmitted disease (STD) testing. Diabetes screening. This is done by checking your blood sugar (glucose) after you have not eaten for a while (fasting). You may have this done every 1-3 years. Bone density scan. This is done to screen for osteoporosis. You may have this done starting at age 19. Mammogram. This may be done every 1-2 years. Talk to your health care provider about how often you should have regular mammograms. Talk with your health care provider  about your test results, treatment options, and if necessary, the need for more tests. Vaccines  Your health care provider may recommend  certain vaccines, such as: Influenza vaccine. This is recommended every year. Tetanus, diphtheria, and acellular pertussis (Tdap, Td) vaccine. You may need a Td booster every 10 years. Zoster vaccine. You may need this after age 72. Pneumococcal 13-valent conjugate (PCV13) vaccine. One dose is recommended after age 61. Pneumococcal polysaccharide (PPSV23) vaccine. One dose is recommended after age 76. Talk to your health care provider about which screenings and vaccines you need and how often you need them. This information is not intended to replace advice given to you by your health care provider. Make sure you discuss any questions you have with your health care provider. Document Released: 01/03/2016 Document Revised: 08/26/2016 Document Reviewed: 10/08/2015 Elsevier Interactive Patient Education  2017 [place] Prevention in the Home Falls can cause injuries. They can happen to people of all ages. There are many things you can do to make your home safe and to help prevent falls. What can I do on the outside of my home? Regularly fix the edges of walkways and driveways and fix any cracks. Remove anything that might make you trip as you walk through a door, such as a raised step or threshold. Trim any bushes or trees on the path to your home. Use bright outdoor lighting. Clear any walking paths of anything that might make someone trip, such as rocks or tools. Regularly check to see if handrails are loose or broken. Make sure that both sides of any steps have handrails. Any raised decks and porches should have guardrails on the edges. Have any leaves, snow, or ice cleared regularly. Use sand or salt on walking paths during winter. Clean up any spills in your garage right away. This includes oil or grease spills. What can I do in the bathroom? Use night lights. Install grab bars by the toilet and in the tub and shower. Do not use towel bars as grab bars. Use non-skid mats or  decals in the tub or shower. If you need to sit down in the shower, use a plastic, non-slip stool. Keep the floor dry. Clean up any water that spills on the floor as soon as it happens. Remove soap buildup in the tub or shower regularly. Attach bath mats securely with double-sided non-slip rug tape. Do not have throw rugs and other things on the floor that can make you trip. What can I do in the bedroom? Use night lights. Make sure that you have a light by your bed that is easy to reach. Do not use any sheets or blankets that are too big for your bed. They should not hang down onto the floor. Have a firm chair that has side arms. You can use this for support while you get dressed. Do not have throw rugs and other things on the floor that can make you trip. What can I do in the kitchen? Clean up any spills right away. Avoid walking on wet floors. Keep items that you use a lot in easy-to-reach places. If you need to reach something above you, use a strong step stool that has a grab bar. Keep electrical cords out of the way. Do not use floor polish or wax that makes floors slippery. If you must use wax, use non-skid floor wax. Do not have throw rugs and other things on the floor that can make you trip. What can I do  with my stairs? Do not leave any items on the stairs. Make sure that there are handrails on both sides of the stairs and use them. Fix handrails that are broken or loose. Make sure that handrails are as long as the stairways. Check any carpeting to make sure that it is firmly attached to the stairs. Fix any carpet that is loose or worn. Avoid having throw rugs at the top or bottom of the stairs. If you do have throw rugs, attach them to the floor with carpet tape. Make sure that you have a light switch at the top of the stairs and the bottom of the stairs. If you do not have them, ask someone to add them for you. What else can I do to help prevent falls? Wear shoes that: Do not  have high heels. Have rubber bottoms. Are comfortable and fit you well. Are closed at the toe. Do not wear sandals. If you use a stepladder: Make sure that it is fully opened. Do not climb a closed stepladder. Make sure that both sides of the stepladder are locked into place. Ask someone to hold it for you, if possible. Clearly mark and make sure that you can see: Any grab bars or handrails. First and last steps. Where the edge of each step is. Use tools that help you move around (mobility aids) if they are needed. These include: Canes. Walkers. Scooters. Crutches. Turn on the lights when you go into a dark area. Replace any light bulbs as soon as they burn out. Set up your furniture so you have a clear path. Avoid moving your furniture around. If any of your floors are uneven, fix them. If there are any pets around you, be aware of where they are. Review your medicines with your doctor. Some medicines can make you feel dizzy. This can increase your chance of falling. Ask your doctor what other things that you can do to help prevent falls. This information is not intended to replace advice given to you by your health care provider. Make sure you discuss any questions you have with your health care provider. Document Released: 10/03/2009 Document Revised: 05/14/2016 Document Reviewed: 01/11/2015 Elsevier Interactive Patient Education  2017 [employer].

## 2021-10-27 NOTE — Progress Notes (Addendum)
Subjective:   [name] is a 70 y.o. female who presents for an Initial Medicare Annual Wellness Visit. Virtual Visit via Telephone Note  I connected with  [name] on 10/27/21 at  1:15 PM EST by telephone and verified that I am speaking with the correct person using two identifiers.  Location: Patient: Home Provider: [facility] Persons participating in the virtual visit: patient/Nurse Health Advisor   I discussed the limitations, risks, security and privacy concerns of performing an evaluation and management service by telephone and the availability of in person appointments. The patient expressed understanding and agreed to proceed.  Interactive audio and video telecommunications were attempted between this nurse and patient, however failed, due to patient having technical difficulties OR patient did not have access to video capability.  We continued and completed visit with audio only.  Some vital signs may be absent or patient reported.   Chriss Driver, LPN  Review of Systems     Cardiac Risk Factors include: advanced age (>57men, >44 women);dyslipidemia;hypertension;sedentary lifestyle;Other (see comment), Risk factor comments: Amputee     Objective:    Today's Vitals   10/27/21 1312 10/27/21 1314  Weight: 138 lb (62.6 kg)   Height: 5' 4.5" (1.638 m)   PainSc:  0-No pain   Body mass index is 23.32 kg/m.  Advanced Directives 10/27/2021 10/06/2021 09/10/2021 07/25/2021 10/31/2020 07/15/2020 05/30/2020  Does Patient Have a Medical Advance Directive? No No No No No No No  Type of Advance Directive - - - - - - -  Copy of Healthcare Power of Attorney in Chart? - - - - - - -  Would patient like information on creating a medical advance directive? No - Patient declined No - Patient declined No - Patient declined No - Patient declined No - Patient declined - -  Pre-existing out of facility DNR order (yellow form or pink MOST form) - - - - - - -    Current Medications  (verified) Outpatient Encounter Medications as of 10/27/2021  Medication Sig   acetaminophen (TYLENOL) 500 MG tablet Take 2 tablets (1,000 mg total) by mouth every 6 (six) hours.   ARTIFICIAL TEAR OP Place 1 drop into both eyes as needed (for dry eyes).   aspirin EC 325 MG tablet Take 325 mg by mouth daily.   calcium-vitamin D (OSCAL WITH D) 500-200 MG-UNIT tablet Take 1 tablet by mouth daily with breakfast.   chlorthalidone (HYGROTON) 25 MG tablet Take 1 tablet (25 mg total) by mouth daily.   diclofenac Sodium (VOLTAREN) 1 % GEL Apply 2 g topically 4 (four) times daily.   diltiazem (CARDIZEM CD) 240 MG 24 hr capsule TAKE 1 CAPSULE EVERY MORNING (Patient taking differently: Take 240 mg by mouth daily.)   ELIQUIS 5 MG TABS tablet TAKE ONE TABLET TWICE DAILY   escitalopram (LEXAPRO) 20 MG tablet Take 1 tablet (20 mg total) by mouth daily.   ezetimibe (ZETIA) 10 MG tablet Take 1 tablet (10 mg total) by mouth daily.   fenofibrate (TRICOR) 145 MG tablet Take 1 tablet (145 mg total) by mouth daily. (NEEDS TO BE SEEN BEFORE NEXT REFILL)   flecainide (TAMBOCOR) 50 MG tablet TAKE 1 TABLET 2 TIMES A DAY   hydroxyurea (HYDREA) 500 MG capsule Take 1 capsule (500 mg total) by mouth daily. Take one capsule daily except Sundays. May take with food to minimize GI side effects.   omeprazole (PRILOSEC) 20 MG capsule TAKE 1 CAPSULE 2 TIMES A DAY BEFORE  A MEAL (Patient taking differently: Take 20 mg by mouth 2 (two) times daily before a meal.)   valsartan (DIOVAN) 160 MG tablet TAKE ONE TABLET ONCE DAILY (Patient taking differently: Take 160 mg by mouth daily.)   No facility-administered encounter medications on file as of 10/27/2021.    Allergies (verified) Atenolol, Demerol  [meperidine hcl], Demerol [meperidine], Gabapentin, Inderal [propranolol], Prednisone, Statins, Warfarin and related, Crestor [rosuvastatin], Docosahexaenoic acid (dha), Lactose intolerance (gi), Lipitor [atorvastatin], Lovaza [omega-3-acid  ethyl esters], Penicillins, Pravastatin, Simvastatin, Trilipix [choline fenofibrate], Warfarin, Hydralazine, Effexor [venlafaxine hcl], Nexium [esomeprazole magnesium], Percocet [oxycodone-acetaminophen], and Plavix [clopidogrel bisulfate]   History: Past Medical History:  Diagnosis Date   Arthritis    "fingers, some in my knees" (01/28/2016)   CAD (coronary artery disease), per cath 04/27/13, non obstructive disease, EF 65-70% 05/11/2013   Carotid disease, bilateral ([place]) 09/2013   Lt carotid 50-69% stentosis, less on rt   Dysrhythmia    Fatty liver    GERD (gastroesophageal reflux disease)    Glaucoma    H/O cardiovascular stress test 12/27/2009   negative for ischemia   Headache    "occasional since eye pressure regulated" (01/28/2016)   Hyperlipidemia    Hypertension    Leukocytosis 07/15/2015   Migraine    "stopped w/laser holes to relieve pressure in my eyes; had them 3-4 times/wk before taht" (01/28/2016)   Mitral regurgitation,2+ by cath 05/11/2013   NSTEMI (non-ST elevated myocardial infarction) ([place]) 04/27/2013   Pain    BOTH KNEES - PT HAS TORN MENISCUS LEFT KNEE   Paroxysmal atrial fibrillation (HCC)    hx of a fib on ASA  AND ELIQUIS    Polycythemia vera ([place]) 08/20/2015   JAK-2 positive 07/19/15   PVD (peripheral vascular disease) with claudication ([place]) 07/2006   previous L ext iliac stent and rt SFA stent, known occluded Lt SFA   S/P cardiac cath 04/27/13   NON OBSTRUCTIVE DISEASE, 2+ mr, ef 65-70%   Statin intolerance    Stroke ([place]) 2006   SWELLING ALL OVER AND RT SIDE OF FACE DRAWN AND SPEECH SLURRED AND NUMBNESS ON RIGHT SIDE-- ALL RESOLVED   Past Surgical History:  Procedure Laterality Date   AMPUTATION Right 05/21/2017   Procedure: AMPUTATION ABOVE KNEE;  Surgeon: Rosetta Posner, MD;  Location: [place] [facility];  Service: Vascular;  Laterality: Right;   CARDIAC CATHETERIZATION  04/27/13   non occlusive disease with mild to mod. calcified lesions in ostial LM and proximal LAD and  moderate prox. RC AND DISTAL AV GROOVE LCX, ef   CATARACT EXTRACTION W/PHACO Right 03/21/2015   Procedure: CATARACT EXTRACTION PHACO AND INTRAOCULAR LENS PLACEMENT RIGHT EYE;  Surgeon: Baruch Goldmann, MD;  Location: [facility];  Service: Ophthalmology;  Laterality: Right;  CDE:5.10   CATARACT EXTRACTION W/PHACO Left 05/16/2015   Procedure: CATARACT EXTRACTION PHACO AND INTRAOCULAR LENS PLACEMENT (IOC);  Surgeon: Baruch Goldmann, MD;  Location: [facility];  Service: Ophthalmology;  Laterality: Left;  CDE 5.57   CESAREAN SECTION  1977   COLONOSCOPY N/A 09/11/2016   Procedure: COLONOSCOPY;  Surgeon: Rogene Houston, MD;  [facility];  Service: Endoscopy;  Laterality: N/A;  730-moved to 1:00 Ann notified pt   EMBOLECTOMY Right 02/01/2016   Procedure: Thrombectomy of Right Femoral-Popliteal Bypass Graft; Endarterectomy of Right Below Knee Popliteal Artery and Tibial Peroneal Trunk with Bovine Pericardium Patch Angioplasty.;  Surgeon: Angelia Mould, MD;  Location: [place];  Service: Vascular;  Laterality: Right;   FEMORAL-POPLITEAL BYPASS GRAFT Right 01/31/2016  Procedure: BYPASS GRAFT RIGHT COMMON  FEMORALTO BELOW KNEE POPLITEAL ARTERY BYPASS GRAFT USING 6MM PROPATEN GORTEX GRAFT;  Surgeon: Mal Misty, MD;  Location: [place];  Service: Vascular;  Laterality: Right;   FEMORAL-POPLITEAL BYPASS GRAFT Right 01/09/2017   Procedure: THROMBECTOMY OF RIGHT FEMORAL-POPLITEAL  ARTERY BYPASS GRAFT; THROMBECTOMY RIGHT  TIBIAL VESSELS;  Surgeon: [name], MD;  Location: [facility];  Service: Vascular;  Laterality: Right;   FEMORAL-POPLITEAL BYPASS GRAFT Right 05/17/2017   Procedure: RIGHT FEMORAL-TIBIAL PERONEAL TRUNK ARTERY BYPASS GRAFT USING 41mmX80cm PROPATEN GRAFT WITH REMOVABLE RING;  Surgeon: Rosetta Posner, MD;  Location: [place];  Service: Vascular;  Laterality: Right;   FRACTURE SURGERY     ILIAC ARTERY STENT  07/2006   external iliac and Rt SFA stent 07/2006 AND THE LEFT WAS IN 2006   KNEE ARTHROSCOPY  WITH MEDIAL MENISECTOMY Left 03/27/2014   Procedure: left knee arthorscopy with medial chondraplasty of the medial femoral and patella, medial microfracture technique of medial femoral condyl;  Surgeon: Tobi Bastos, MD;  Location: [facility];  Service: Orthopedics;  Laterality: Left;   LEFT HEART CATHETERIZATION WITH CORONARY ANGIOGRAM Right 04/27/2013   Procedure: LEFT HEART CATHETERIZATION WITH CORONARY ANGIOGRAM;  Surgeon: Leonie Man, MD;  Location: [facility] CATH LAB;  Service: Cardiovascular;  Laterality: Right;   LOWER EXTREMITY ANGIOGRAM Right 01/31/2016   Procedure: INTRAOP RIGHT LOWER EXTREMITY ANGIOGRAM;  Surgeon: Mal Misty, MD;  Location: [place];  Service: Vascular;  Laterality: Right;   ORIF WRIST FRACTURE Right 1983   OVARIAN CYST REMOVAL Right    PATCH ANGIOPLASTY Right 01/31/2016   Procedure: RIGHT COMMON FEMORAL AND PROFUNDA FEMORIS ENDARECTOMY WITH PATCH ANGIOPLASTY;  Surgeon: Mal Misty, MD;  Location: [place];  Service: Vascular;  Laterality: Right;   PATCH ANGIOPLASTY Right 01/09/2017   Procedure: PATCH ANGIOPLASTY RIGHT POPLITEAL ARTERY BYPASS GRAFT;  Surgeon: [name], MD;  Location: [place];  Service: Vascular;  Laterality: Right;   PERIPHERAL VASCULAR CATHETERIZATION N/A 06/27/2015   Procedure: Lower Extremity Angiography;  Surgeon: Lorretta Harp, MD;  Location: [place] CV LAB;  Service: Cardiovascular;  Laterality: N/A;   PERIPHERAL VASCULAR CATHETERIZATION Bilateral 01/29/2016   Procedure: Lower Extremity Angiography;  Surgeon: Wellington Hampshire, MD;  Location: [place] CV LAB;  Service: Cardiovascular;  Laterality: Bilateral;   PERIPHERAL VASCULAR CATHETERIZATION N/A 01/29/2016   Procedure: Abdominal Aortogram;  Surgeon: Wellington Hampshire, MD;  Location: [place] CV LAB;  Service: Cardiovascular;  Laterality: N/A;   REFRACTIVE SURGERY Bilateral    "6 in one eye; 7 in the other; to relieve pressure; not glaucoma" (01/28/2016)   SFA Right 06/27/2015   overlapping  [place]ahn covered stents   [place] Left 05/17/2017   Procedure: LEFT LEG GREATER SAPHENOUS VEIN HARVEST;  Surgeon: Rosetta Posner, MD;  Location: [place];  Service: Vascular;  Laterality: Left;   VEIN REPAIR Right 01/31/2016   Procedure: RIGHT GREATER SAPHENOUS VEIN EXAMNED BUT NOT REMOVED;  Surgeon: Mal Misty, MD;  Location: [facility] OR;  Service: Vascular;  Laterality: Right;   Family History  Problem Relation Age of Onset   CAD Mother    Lung cancer Mother    Heart attack Mother    CAD Father    Heart disease Father    Heart attack Father 28       died in hes 3's with heart disease   Healthy Sister    Liver cancer Brother  Healthy Sister    CAD Brother        has had CABG   CAD Brother    Social History   Socioeconomic History   Marital status: Widowed    Spouse name: Not on file   Number of children: 2   Years of education: 59   Highest education level: Not on file  Occupational History   Occupation: CNA  Tobacco Use   Smoking status: Former    Packs/day: 1.00    Years: 45.00    Pack years: 45.00    Types: Cigarettes    Quit date: 11/11/2012    Years since quitting: 8.9   Smokeless tobacco: Never   Tobacco comments:    quit 4 years ago.    10/27/21-Pt declines lung ca screen at this time.   Vaping Use   Vaping Use: Never used  Substance and Sexual Activity   Alcohol use: No    Alcohol/week: 0.0 standard drinks   Drug use: No   Sexual activity: Not Currently    Birth control/protection: None  Other Topics Concern   Not on file  Social History Narrative   Husband passed away after CVA in 04-05-05.   2 children, 5 grandchildren and 1 great grandchild.   Social Determinants of Health   Financial Resource Strain: Low Risk    Difficulty of Paying Living Expenses: Not hard at all  Food Insecurity: No Food Insecurity   Worried About Charity fundraiser in the Last Year: Never true   [place] in the Last Year: Never true   Transportation Needs: No Transportation Needs   Lack of Transportation (Medical): No   Lack of Transportation (Non-Medical): No  Physical Activity: Insufficiently Active   Days of Exercise per Week: 3 days   Minutes of Exercise per Session: 30 min  Stress: No Stress Concern Present   Feeling of Stress : Not at all  Social Connections: Moderately Integrated   Frequency of Communication with Friends and Family: More than three times a week   Frequency of Social Gatherings with Friends and Family: More than three times a week   Attends Religious Services: 1 to 4 times per year   Active Member of [employer] or Organizations: Yes   Attends Archivist Meetings: 1 to 4 times per year   Marital Status: Widowed    Tobacco Counseling Counseling given: Not Answered Tobacco comments: quit 4 years ago. 10/27/21-Pt declines lung ca screen at this time.    Clinical Intake:  Pre-visit preparation completed: Yes  Pain : No/denies pain Pain Score: 0-No pain     BMI - recorded: 23.32 Nutritional Status: BMI of 19-24  Normal Nutritional Risks: None Diabetes: No  How often do you need to have someone help you when you read instructions, pamphlets, or other written materials from your doctor or pharmacy?: 1 - Never  Diabetic?No  Interpreter Needed?: No  Information entered by :: MJ Martavis Gurney, LPN   Activities of Daily Living In your present state of health, do you have any difficulty performing the following activities: 10/27/2021 10/06/2021  Hearing? N N  Vision? N N  Difficulty concentrating or making decisions? Y N  Comment Remebering at times. -  Walking or climbing stairs? N N  Dressing or bathing? N N  Doing errands, shopping? N N  Preparing Food and eating ? N -  Using the Toilet? N -  In the past six months, have you accidently leaked urine? N -  Do you have problems with loss of bowel control? N -  Managing your Medications? N -  Managing your Finances? N -   Housekeeping or managing your Housekeeping? N -  Some recent data might be hidden    Patient Care Team: Ivy Lynn, NP as PCP - General (Nurse Practitioner) Lorretta Harp, MD as PCP - Cardiology (Cardiology) Annia Belt, MD as Consulting Physician (Oncology) [name], MD as Consulting Physician (Vascular Surgery)  Indicate any recent Medical Services you may have received from other than [facility] providers in the past year (date may be approximate).     Assessment:   This is a routine wellness examination for [place].  Hearing/Vision screen Hearing Screening - Comments:: Some hearing issues.  Vision Screening - Comments:: Readers. Has upcoming eye exam. [facility].  Dietary issues and exercise activities discussed: Current Exercise Habits: Home exercise routine, Type of exercise: walking, Time (Minutes): 20, Frequency (Times/Week): 3, Weekly Exercise (Minutes/Week): 60, Intensity: Mild, Exercise limited by: cardiac condition(s);orthopedic condition(s)   Goals Addressed             This Visit's Progress    Exercise 3x per week (30 min per time)         Depression Screen PHQ 2/9 Scores 10/27/2021 10/15/2021 09/03/2021 06/04/2021 01/22/2021 06/28/2020 06/30/2019  PHQ - 2 Score 0 0 0 0 2 0 3  PHQ- 9 Score - - - 1 9 4 9     Fall Risk Fall Risk  10/27/2021 10/15/2021 09/09/2021 09/03/2021 06/04/2021  Falls in the past year? 0 0 1 0 0  Comment - - - - -  Number falls in past yr: 0 - 0 - -  Injury with Fall? 0 - 0 - -  Comment - - - - -  Risk Factor Category  - - - - -  Risk for fall due to : Impaired balance/gait;Impaired mobility;Orthopedic patient - Impaired balance/gait - -  Risk for fall due to: Comment - - - - -  Follow up Falls prevention discussed - Falls prevention discussed - -    FALL RISK PREVENTION PERTAINING TO THE HOME:  Any stairs in or around the home? Yes  If so, are there any without handrails? No  Home free of loose throw rugs in walkways,  pet beds, electrical cords, etc? Yes  Adequate lighting in your home to reduce risk of falls? Yes   ASSISTIVE DEVICES UTILIZED TO PREVENT FALLS:  Life alert? No  Use of a cane, walker or w/c? Yes  Grab bars in the bathroom? Yes  Shower chair or bench in shower? Yes  Elevated toilet seat or a handicapped toilet? Yes   TIMED UP AND GO:  Was the test performed? No . Phone visit.    Cognitive Function:     6CIT Screen 10/27/2021  What Year? 0 points  What month? 0 points  What time? 0 points  Count back from 20 0 points  Months in reverse 0 points  Repeat phrase 4 points  Total Score 4    Immunizations Immunization History  Administered Date(s) Administered   Tdap 09/09/2018    TDAP status: Due, Education has been provided regarding the importance of this vaccine. Advised may receive this vaccine at local pharmacy or Health Dept. Aware to provide a copy of the vaccination record if obtained from local pharmacy or Health Dept. Verbalized acceptance and understanding.  Flu Vaccine status: Declined, Education has been provided regarding the importance of this vaccine  but patient still declined. Advised may receive this vaccine at local pharmacy or Health Dept. Aware to provide a copy of the vaccination record if obtained from local pharmacy or Health Dept. Verbalized acceptance and understanding.  Pneumococcal vaccine status: Declined,  Education has been provided regarding the importance of this vaccine but patient still declined. Advised may receive this vaccine at local pharmacy or Health Dept. Aware to provide a copy of the vaccination record if obtained from local pharmacy or Health Dept. Verbalized acceptance and understanding.   Covid-19 vaccine status: Declined, Education has been provided regarding the importance of this vaccine but patient still declined. Advised may receive this vaccine at local pharmacy or Health Dept.or vaccine clinic. Aware to provide a copy of the  vaccination record if obtained from local pharmacy or Health Dept. Verbalized acceptance and understanding.  Qualifies for Shingles Vaccine? Yes   Zostavax completed No   Shingrix Completed?: No.    Education has been provided regarding the importance of this vaccine. Patient has been advised to call insurance company to determine out of pocket expense if they have not yet received this vaccine. Advised may also receive vaccine at local pharmacy or Health Dept. Verbalized acceptance and understanding.  Screening Tests Health Maintenance  Topic Date Due   Hepatitis C Screening  Never done   COVID-19 Vaccine (1) 10/31/2021 (Originally 02/23/1952)   Zoster Vaccines- Shingrix (1 of 2) 12/09/2021 (Originally [birth year]1970)   INFLUENZA VACCINE  03/20/2022 (Originally 07/21/2021)   MAMMOGRAM  06/04/2022 (Originally 08/25/2001)   Pneumonia Vaccine 18+ Years old (1 - PCV) 10/15/2022 (Originally 08/25/1957)   COLONOSCOPY (Pts 45-20yrs Insurance coverage will need to be confirmed)  09/11/2026   TETANUS/TDAP  09/09/2028   DEXA SCAN  Completed   HPV VACCINES  Aged Out    Health Maintenance  Health Maintenance Due  Topic Date Due   Hepatitis C Screening  Never done    Colorectal cancer screening: Type of screening: Colonoscopy. Completed 09/11/2016. Repeat every 10 years  Mammogram status: Completed 09/10/2006. Repeat every year  Bone Density status: Completed 06/06/21. Results reflect: Bone density results: OSTEOPOROSIS. Repeat every 2 years.  Lung Cancer Screening: (Low Dose CT Chest recommended if Age 13-80 years, 30 pack-year currently smoking OR have quit w/in 15years.) does qualify.   Lung Cancer Screening Referral: Pt declined at this time.  Additional Screening:  Hepatitis C Screening: does qualify; Completed Not done  Vision Screening: Recommended annual ophthalmology exams for early detection of glaucoma and other disorders of the eye. Is the patient up to date with their annual eye exam?  Yes   Who is the provider or what is the name of the office in which the patient attends annual eye exams? [facility]. If pt is not established with a provider, would they like to be referred to a provider to establish care? No .   Dental Screening: Recommended annual dental exams for proper oral hygiene  Community Resource Referral / Chronic Care Management: CRR required this visit?  No   CCM required this visit?  No      Plan:     I have personally reviewed and noted the following in the patient's chart:   Medical and social history Use of alcohol, tobacco or illicit drugs  Current medications and supplements including opioid prescriptions. Patient is not currently taking opioid prescriptions. Functional ability and status Nutritional status Physical activity Advanced directives List of other physicians Hospitalizations, surgeries, and ER visits in previous 12 months Vitals Screenings  to include cognitive, depression, and falls Referrals and appointments  In addition, I have reviewed and discussed with patient certain preventive protocols, quality metrics, and best practice recommendations. A written personalized care plan for preventive services as well as general preventive health recommendations were provided to patient.     Chriss Driver, LPN   98/0/2217   Nurse Notes: Pt declined all vaccines. Has had Tdap. Declined lung ca screening at this time. Declined mammogram. Up to date with Colonoscopy and DEXA.

## 2021-10-29 ENCOUNTER — Other Ambulatory Visit: Payer: Self-pay

## 2021-10-29 ENCOUNTER — Ambulatory Visit (INDEPENDENT_AMBULATORY_CARE_PROVIDER_SITE_OTHER): Payer: 59 | Admitting: Vascular Surgery

## 2021-10-29 ENCOUNTER — Encounter: Payer: Self-pay | Admitting: Vascular Surgery

## 2021-10-29 VITALS — BP 212/73 | HR 69 | Temp 97.7°F | Wt 137.4 lb

## 2021-10-29 DIAGNOSIS — I6523 Occlusion and stenosis of bilateral carotid arteries: Secondary | ICD-10-CM | POA: Diagnosis not present

## 2021-10-29 NOTE — Progress Notes (Signed)
[facility] [place]  Patient name: [name] MRN: [id number] DOB: 1951-02-02 Sex: female  REASON FOR CONSULT: Evaluation bilateral carotid stenosis  HPI: [name] is a 70 y.o. female, who is here today for evaluation of bilateral carotid artery disease.  She reports that she had a severe right frontal headache.  She was able to drive to her daughters and was driving to the grocery store.  Her daughter felt that she was not acting correctly and was slurring her speech and had a facial droop and brought her to [facility] emergency department.  She underwent work-up there and was admitted for acute stroke.  MRI revealed "tiny acute to subacute infarct in the right centrum's and ovale".  She did undergo noninvasive carotid duplex which revealed bilateral disease.  It was felt to be greater than 70% stenosis on the left and 50 to 69% stenosis on the right.  She was discharged to home.  She has undergone 2D echocardiogram with this event which showed no obvious embolic source.  She is known to me from prior lower extremity arterial insufficiency.  She had undergone right femoral to popliteal bypass with my partner Dr. Kellie Simmering in 2018.  She had Jamisha Hoeschen failure of this and failure and multiple attempts at bypass for limb salvage.  She underwent a right above-knee amputation by myself in June 2018.  She has done extremely well.  She walks with an above-knee prosthesis and the aid of a walker.  Past Medical History:  Diagnosis Date   Arthritis    "fingers, some in my knees" (01/28/2016)   CAD (coronary artery disease), per cath 04/27/13, non obstructive disease, EF 65-70% 05/11/2013   Carotid disease, bilateral ([place]) 09/2013   Lt carotid 50-69% stentosis, less on rt   Dysrhythmia    Fatty liver    GERD (gastroesophageal reflux disease)    Glaucoma    H/O cardiovascular stress test 12/27/2009   negative for ischemia   Headache     "occasional since eye pressure regulated" (01/28/2016)   Hyperlipidemia    Hypertension    Leukocytosis 07/15/2015   Migraine    "stopped w/laser holes to relieve pressure in my eyes; had them 3-4 times/wk before taht" (01/28/2016)   Mitral regurgitation,2+ by cath 05/11/2013   NSTEMI (non-ST elevated myocardial infarction) ([place]) 04/27/2013   Pain    BOTH KNEES - PT HAS TORN MENISCUS LEFT KNEE   Paroxysmal atrial fibrillation (HCC)    hx of a fib on ASA  AND ELIQUIS    Polycythemia vera ([place]) 08/20/2015   JAK-2 positive 07/19/15   PVD (peripheral vascular disease) with claudication ([place]) 07/2006   previous L ext iliac stent and rt SFA stent, known occluded Lt SFA   S/P cardiac cath 04/27/13   NON OBSTRUCTIVE DISEASE, 2+ mr, ef 65-70%   Statin intolerance    Stroke ([place]) 2006   SWELLING ALL OVER AND RT SIDE OF FACE DRAWN AND SPEECH SLURRED AND NUMBNESS ON RIGHT SIDE-- ALL RESOLVED    Family History  Problem Relation Age of Onset   CAD Mother    Lung cancer Mother    Heart attack Mother    CAD Father    Heart disease Father    Heart attack Father 51       died in hes 57's with heart disease   Healthy Sister    Liver cancer Brother    Healthy Sister    CAD Brother  has had CABG   CAD Brother     SOCIAL HISTORY: Social History   Socioeconomic History   Marital status: Widowed    Spouse name: Not on file   Number of children: 2   Years of education: 42   Highest education level: Not on file  Occupational History   Occupation: CNA  Tobacco Use   Smoking status: Former    Packs/day: 1.00    Years: 45.00    Pack years: 45.00    Types: Cigarettes    Quit date: 11/11/2012    Years since quitting: 8.9   Smokeless tobacco: Never   Tobacco comments:    quit 4 years ago.    10/27/21-Pt declines lung ca screen at this time.   Vaping Use   Vaping Use: Never used  Substance and Sexual Activity   Alcohol use: No    Alcohol/week: 0.0 standard drinks   Drug use: No   Sexual  activity: Not Currently    Birth control/protection: None  Other Topics Concern   Not on file  Social History Narrative   Husband passed away after CVA in March 15, 2005.   2 children, 5 grandchildren and 1 great grandchild.   Social Determinants of Health   Financial Resource Strain: Low Risk    Difficulty of Paying Living Expenses: Not hard at all  Food Insecurity: No Food Insecurity   Worried About Charity fundraiser in the Last Year: Never true   [place] in the Last Year: Never true  Transportation Needs: No Transportation Needs   Lack of Transportation (Medical): No   Lack of Transportation (Non-Medical): No  Physical Activity: Insufficiently Active   Days of Exercise per Week: 3 days   Minutes of Exercise per Session: 30 min  Stress: No Stress Concern Present   Feeling of Stress : Not at all  Social Connections: Moderately Integrated   Frequency of Communication with Friends and Family: More than three times a week   Frequency of Social Gatherings with Friends and Family: More than three times a week   Attends Religious Services: 1 to 4 times per year   Active Member of [employer] or Organizations: Yes   Attends Archivist Meetings: 1 to 4 times per year   Marital Status: Widowed  Human resources officer Violence: Not At Risk   Fear of Current or Ex-Partner: No   Emotionally Abused: No   Physically Abused: No   Sexually Abused: No    Allergies  Allergen Reactions   Atenolol Rash and Itching   Demerol  [Meperidine Hcl] Itching   Demerol [Meperidine] Other (See Comments) and Itching    Unknown, can't remember  Unknown, can't remember    Gabapentin Anxiety and Other (See Comments)    SI and HI   Inderal [Propranolol] Other (See Comments) and Itching    Doesn't remember    Prednisone Other (See Comments)    Muscle spasms   Statins Itching and Other (See Comments)    Simvastatin-caused severe itching Pravastatin-caused lesser tiching One type of statin? Caused  stroke in 2005-03-15, and other symptoms as result   Warfarin And Related Other (See Comments)    Caused nose bleeds   Crestor [Rosuvastatin] Itching and Rash   Docosahexaenoic Acid (Dha)     Other reaction(s): Other (See Comments) nosebleeds   Lactose Intolerance (Gi) Other (See Comments)    Bloating, gas   Lipitor [Atorvastatin] Rash   Lovaza [Omega-3-Acid Ethyl Esters] Other (See Comments)  nosebleeds    Penicillins Hives, Rash and Other (See Comments)    Has patient had a PCN reaction causing immediate rash, facial/tongue/throat swelling, SOB or lightheadedness with hypotension: Yes Has patient had a PCN reaction causing severe rash involving mucus membranes or skin necrosis: No Has patient had a PCN reaction that required hospitalization: No Has patient had a PCN reaction occurring within the last 10 years: No If all of the above answers are "NO", then may proceed with Cephalosporin use.  Questionable high fever    Pravastatin Itching and Rash   Simvastatin Itching, Rash and Other (See Comments)   Trilipix [Choline Fenofibrate] Other (See Comments)    Doesn't remember    Warfarin Other (See Comments)    nosebleeds   Hydralazine Swelling   Effexor [Venlafaxine Hcl] Other (See Comments)    Doesn't remember    Nexium [Esomeprazole Magnesium] Rash   Percocet [Oxycodone-Acetaminophen] Other (See Comments)    Doesn't remember    Plavix [Clopidogrel Bisulfate] Rash    Current Outpatient Medications  Medication Sig Dispense Refill   acetaminophen (TYLENOL) 500 MG tablet Take 2 tablets (1,000 mg total) by mouth every 6 (six) hours. 30 tablet 0   ARTIFICIAL TEAR OP Place 1 drop into both eyes as needed (for dry eyes).     aspirin EC 325 MG tablet Take 325 mg by mouth daily.     calcium-vitamin D (OSCAL WITH D) 500-200 MG-UNIT tablet Take 1 tablet by mouth daily with breakfast. 90 tablet 1   diclofenac Sodium (VOLTAREN) 1 % GEL Apply 2 g topically 4 (four) times daily. 50 g 0    diltiazem (CARDIZEM CD) 240 MG 24 hr capsule TAKE 1 CAPSULE EVERY MORNING (Patient taking differently: Take 240 mg by mouth daily.) 90 capsule 0   ELIQUIS 5 MG TABS tablet TAKE ONE TABLET TWICE DAILY 60 tablet 2   escitalopram (LEXAPRO) 20 MG tablet Take 1 tablet (20 mg total) by mouth daily. 30 tablet 1   ezetimibe (ZETIA) 10 MG tablet Take 1 tablet (10 mg total) by mouth daily. 30 tablet 1   fenofibrate (TRICOR) 145 MG tablet Take 1 tablet (145 mg total) by mouth daily. (NEEDS TO BE SEEN BEFORE NEXT REFILL) 30 tablet 0   flecainide (TAMBOCOR) 50 MG tablet TAKE 1 TABLET 2 TIMES A DAY 60 tablet 1   hydroxyurea (HYDREA) 500 MG capsule Take 1 capsule (500 mg total) by mouth daily. Take one capsule daily except Sundays. May take with food to minimize GI side effects. 30 capsule 3   omeprazole (PRILOSEC) 20 MG capsule TAKE 1 CAPSULE 2 TIMES A DAY BEFORE A MEAL (Patient taking differently: Take 20 mg by mouth 2 (two) times daily before a meal.) 180 capsule 0   valsartan (DIOVAN) 160 MG tablet TAKE ONE TABLET ONCE DAILY (Patient taking differently: Take 160 mg by mouth daily.) 60 tablet 4   chlorthalidone (HYGROTON) 25 MG tablet Take 1 tablet (25 mg total) by mouth daily. (Patient not taking: Reported on 10/29/2021) 30 tablet 5   No current facility-administered medications for this visit.    REVIEW OF SYSTEMS:  [X]  denotes positive finding, [ ]  denotes negative finding Cardiac  Comments:  Chest pain or chest pressure: x   Shortness of breath upon exertion: x   Short of breath when lying flat:    Irregular heart rhythm: x       Vascular    Pain in calf, thigh, or hip brought on by ambulation: x  Pain in feet at night that wakes you up from your sleep:     Blood clot in your veins:    Leg swelling:  x       Pulmonary    Oxygen at home:    Productive cough:     Wheezing:         Neurologic    Sudden weakness in arms or legs:     Sudden numbness in arms or legs:     Sudden onset of  difficulty speaking or slurred speech:    Temporary loss of vision in one eye:     Problems with dizziness:         Gastrointestinal    Blood in stool:     Vomited blood:         Genitourinary    Burning when urinating:     Blood in urine:        Psychiatric    Major depression:         Hematologic    Bleeding problems:    Problems with blood clotting too easily:        Skin    Rashes or ulcers:        Constitutional    Fever or chills:      PHYSICAL EXAM: Vitals:   10/29/21 1055  BP: (!) 212/73  Pulse: 69  Temp: 97.7 F (36.5 C)  TempSrc: Skin  SpO2: 98%  Weight: 137 lb 5.8 oz (62.3 kg)    GENERAL: The patient is a well-nourished female, in no acute distress. The vital signs are documented above. CARDIOVASCULAR: Bilateral carotid bruits present.  2+ radial pulses bilaterally PULMONARY: There is good air exchange  MUSCULOSKELETAL: There are no major deformities or cyanosis. NEUROLOGIC: No focal weakness or paresthesias are detected. SKIN: There are no ulcers or rashes noted. PSYCHIATRIC: The patient has a normal affect.  DATA:  Carotid duplex reviewed revealing greater than 70% left and 50 to 69% right carotid stenosis  MEDICAL ISSUES: Had long discussion with the patient regarding her carotid disease.  I explained that with moderate carotid stenosis with symptoms felt to be referable to this we would recommend endarterectomy for reduction of future stroke risk.  She is right-handed.  I explained that this would potentially affect her left side if she had a recurrent right brain event.  She does have a right above-knee prosthesis that she walks with.  I have recommended CT angiogram of her head and neck for further evaluation.  If this confirms moderate right carotid disease, would recommend endarterectomy for reduction of stroke risk.  We will obtain this is soon as possible as an outpatient and I will discuss findings with her by telephone.  She understands that if  surgery is indicated, will be done in [place] at [facility] by one of my partners.   Rosetta Posner, MD FACS [facility] [phone] Pager [phone]  Note: Portions of this report may have been transcribed using voice recognition software.  Every effort has been made to ensure accuracy; however, inadvertent computerized transcription errors may still be present.

## 2021-10-30 ENCOUNTER — Other Ambulatory Visit: Payer: Self-pay

## 2021-10-30 DIAGNOSIS — I6523 Occlusion and stenosis of bilateral carotid arteries: Secondary | ICD-10-CM

## 2021-10-31 ENCOUNTER — Telehealth: Payer: Self-pay

## 2021-10-31 NOTE — Telephone Encounter (Signed)
-----   Message from Cathlean Cower sent at 10/31/2021  8:53 AM EST ----- Regarding: RE: CTA 10/31/21 - left msg on vm.     ad ----- Message ----- From: Kaleen Mask, LPN Sent: 21/74/7159   4:25 PM EST To: April H Pait, Jillyn Hidden, # Subject: CTA                                            Please schedule at [facility] prior to appt on 08/10/1951.

## 2021-11-03 ENCOUNTER — Encounter (HOSPITAL_COMMUNITY): Payer: Self-pay | Admitting: Emergency Medicine

## 2021-11-03 ENCOUNTER — Observation Stay (HOSPITAL_COMMUNITY)
Admission: EM | Admit: 2021-11-03 | Discharge: 2021-11-04 | Disposition: A | Discharge status: Home / Self Care | Payer: 59 | Attending: Internal Medicine | Admitting: Internal Medicine

## 2021-11-03 ENCOUNTER — Other Ambulatory Visit: Payer: Self-pay

## 2021-11-03 ENCOUNTER — Emergency Department (HOSPITAL_COMMUNITY): Payer: 59

## 2021-11-03 DIAGNOSIS — Z66 Do not resuscitate: Secondary | ICD-10-CM | POA: Diagnosis not present

## 2021-11-03 DIAGNOSIS — Z7901 Long term (current) use of anticoagulants: Secondary | ICD-10-CM | POA: Insufficient documentation

## 2021-11-03 DIAGNOSIS — Z87891 Personal history of nicotine dependence: Secondary | ICD-10-CM | POA: Insufficient documentation

## 2021-11-03 DIAGNOSIS — Z89611 Acquired absence of right leg above knee: Secondary | ICD-10-CM | POA: Diagnosis not present

## 2021-11-03 DIAGNOSIS — R200 Anesthesia of skin: Secondary | ICD-10-CM | POA: Diagnosis present

## 2021-11-03 DIAGNOSIS — I480 Paroxysmal atrial fibrillation: Secondary | ICD-10-CM | POA: Diagnosis not present

## 2021-11-03 DIAGNOSIS — I739 Peripheral vascular disease, unspecified: Secondary | ICD-10-CM | POA: Insufficient documentation

## 2021-11-03 DIAGNOSIS — Z79899 Other long term (current) drug therapy: Secondary | ICD-10-CM | POA: Diagnosis not present

## 2021-11-03 DIAGNOSIS — G459 Transient cerebral ischemic attack, unspecified: Principal | ICD-10-CM | POA: Diagnosis present

## 2021-11-03 DIAGNOSIS — I10 Essential (primary) hypertension: Secondary | ICD-10-CM | POA: Diagnosis present

## 2021-11-03 DIAGNOSIS — Z7982 Long term (current) use of aspirin: Secondary | ICD-10-CM | POA: Diagnosis not present

## 2021-11-03 DIAGNOSIS — E785 Hyperlipidemia, unspecified: Secondary | ICD-10-CM | POA: Diagnosis not present

## 2021-11-03 DIAGNOSIS — I2510 Atherosclerotic heart disease of native coronary artery without angina pectoris: Secondary | ICD-10-CM | POA: Diagnosis not present

## 2021-11-03 DIAGNOSIS — R29810 Facial weakness: Secondary | ICD-10-CM | POA: Diagnosis present

## 2021-11-03 DIAGNOSIS — Z8673 Personal history of transient ischemic attack (TIA), and cerebral infarction without residual deficits: Secondary | ICD-10-CM | POA: Diagnosis not present

## 2021-11-03 DIAGNOSIS — Z20822 Contact with and (suspected) exposure to covid-19: Secondary | ICD-10-CM | POA: Insufficient documentation

## 2021-11-03 DIAGNOSIS — Z28310 Unvaccinated for covid-19: Secondary | ICD-10-CM | POA: Insufficient documentation

## 2021-11-03 DIAGNOSIS — I119 Hypertensive heart disease without heart failure: Secondary | ICD-10-CM | POA: Diagnosis not present

## 2021-11-03 DIAGNOSIS — R519 Headache, unspecified: Secondary | ICD-10-CM | POA: Diagnosis not present

## 2021-11-03 HISTORY — DX: Transient cerebral ischemic attack, unspecified: G45.9

## 2021-11-03 LAB — COMPREHENSIVE METABOLIC PANEL
ALT: 17 U/L (ref 0–44)
AST: 22 U/L (ref 15–41)
Albumin: 3.1 g/dL — ABNORMAL LOW (ref 3.5–5.0)
Alkaline Phosphatase: 61 U/L (ref 38–126)
Anion gap: 7 (ref 5–15)
BUN: 29 mg/dL — ABNORMAL HIGH (ref 8–23)
CO2: 23 mmol/L (ref 22–32)
Calcium: 8.2 mg/dL — ABNORMAL LOW (ref 8.9–10.3)
Chloride: 107 mmol/L (ref 98–111)
Creatinine, Ser: 0.94 mg/dL (ref 0.44–1.00)
GFR, Estimated: >60 mL/min (ref >60)
Glucose, Bld: 84 mg/dL (ref 70–99)
Potassium: 4.9 mmol/L (ref 3.5–5.1)
Sodium: 137 mmol/L (ref 135–145)
Total Bilirubin: 0.5 mg/dL (ref 0.3–1.2)
Total Protein: 6.5 g/dL (ref 6.5–8.1)

## 2021-11-03 LAB — DIFFERENTIAL
Abs Immature Granulocytes: 0.17 10*3/uL — ABNORMAL HIGH (ref 0.00–0.07)
Basophils Absolute: 0.1 10*3/uL (ref 0.0–0.1)
Basophils Relative: 1 %
Eosinophils Absolute: 0.4 10*3/uL (ref 0.0–0.5)
Eosinophils Relative: 3 %
Immature Granulocytes: 1 %
Lymphocytes Relative: 9 %
Lymphs Abs: 1.2 10*3/uL (ref 0.7–4.0)
Monocytes Absolute: 0.4 10*3/uL (ref 0.1–1.0)
Monocytes Relative: 3 %
Neutro Abs: 10.5 10*3/uL — ABNORMAL HIGH (ref 1.7–7.7)
Neutrophils Relative %: 83 %

## 2021-11-03 LAB — CBC
HCT: 41.6 % (ref 36.0–46.0)
Hemoglobin: 12 g/dL (ref 12.0–15.0)
MCH: 23.4 pg — ABNORMAL LOW (ref 26.0–34.0)
MCHC: 28.8 g/dL — ABNORMAL LOW (ref 30.0–36.0)
MCV: 81.1 fL (ref 80.0–100.0)
Platelets: 342 10*3/uL (ref 150–400)
RBC: 5.13 MIL/uL — ABNORMAL HIGH (ref 3.87–5.11)
RDW: 19.9 % — ABNORMAL HIGH (ref 11.5–15.5)
WBC: 12.8 10*3/uL — ABNORMAL HIGH (ref 4.0–10.5)
nRBC: 0.2 % (ref 0.0–0.2)

## 2021-11-03 LAB — I-STAT CHEM 8, ED
BUN: 27 mg/dL — ABNORMAL HIGH (ref 8–23)
Calcium, Ion: 1.1 mmol/L — ABNORMAL LOW (ref 1.15–1.40)
Chloride: 106 mmol/L (ref 98–111)
Creatinine, Ser: 1 mg/dL (ref 0.44–1.00)
Glucose, Bld: 88 mg/dL (ref 70–99)
HCT: 39 % (ref 36.0–46.0)
Hemoglobin: 13.3 g/dL (ref 12.0–15.0)
Potassium: 4.8 mmol/L (ref 3.5–5.1)
Sodium: 139 mmol/L (ref 135–145)
TCO2: 25 mmol/L (ref 22–32)

## 2021-11-03 LAB — PROTIME-INR
INR: 1.1 (ref 0.8–1.2)
Prothrombin Time: 14.5 seconds (ref 11.4–15.2)

## 2021-11-03 LAB — RESP PANEL BY RT-PCR (FLU A&B, COVID) ARPGX2
Influenza A by PCR: NEGATIVE
Influenza B by PCR: NEGATIVE
SARS Coronavirus 2 by RT PCR: NEGATIVE

## 2021-11-03 LAB — ETHANOL: Alcohol, Ethyl (B): <10 mg/dL (ref <10)

## 2021-11-03 LAB — CBG MONITORING, ED: Glucose-Capillary: 78 mg/dL (ref 70–99)

## 2021-11-03 LAB — APTT: aPTT: 48 seconds — ABNORMAL HIGH (ref 24–36)

## 2021-11-03 IMAGING — CT CT HEAD CODE STROKE
3 series · 15 of 47 positions shown, 18 images · non-contrast
Comparison: Prior MRI from [DATE].

CLINICAL DATA: Code stroke. Initial evaluation for neuro deficit,
stroke suspected.

EXAM:
CT HEAD WITHOUT CONTRAST
TECHNIQUE: Contiguous axial images were obtained from the base of the skull
through the vertex without intravenous contrast.

[Series 2: head w o · axial · 0.40mm/px · z∈[+670,+800]mm · 9 of 32 slices shown, 12 images]
[im 3/32  brain]
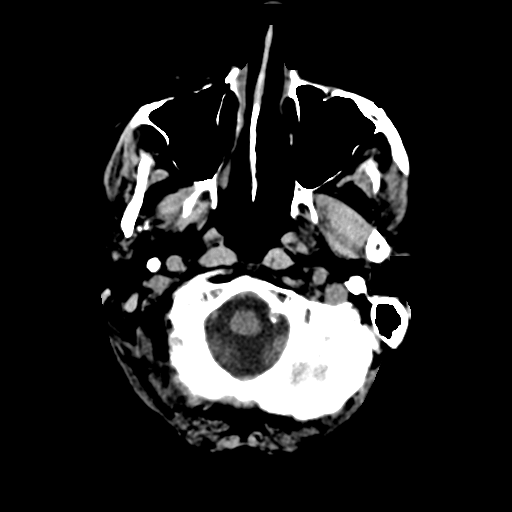
[im 3/32  bone]
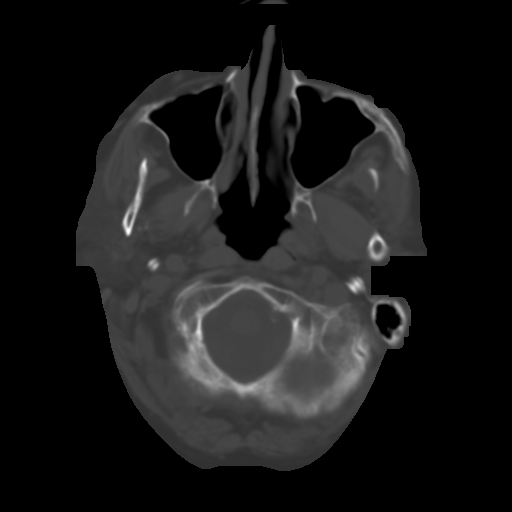
[im 6/32  brain]
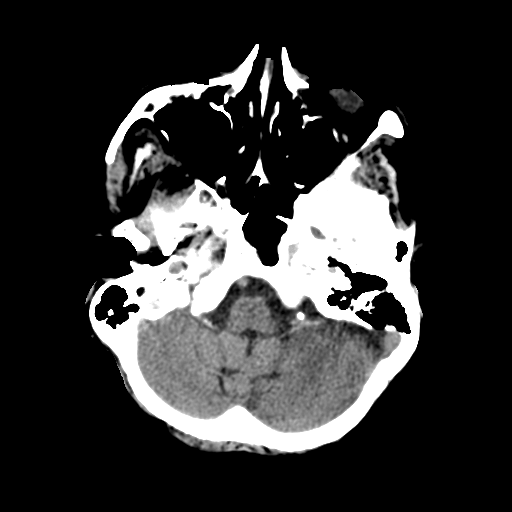
[im 9/32  brain]
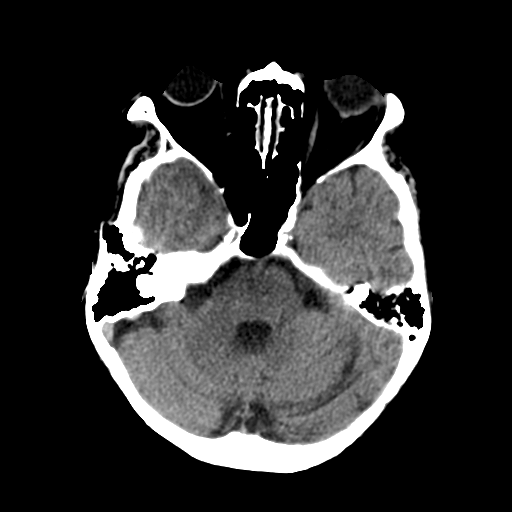
[im 12/32  brain]
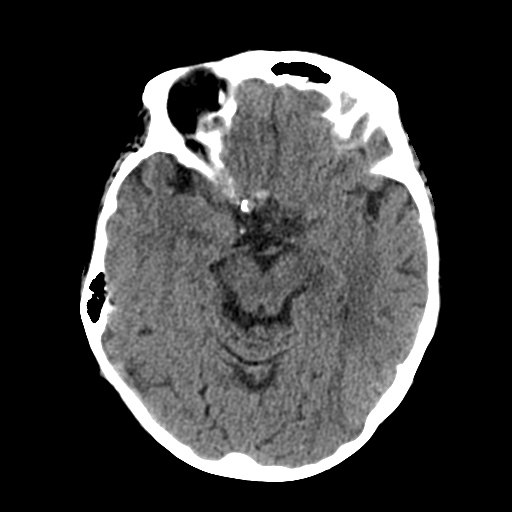
[im 17/32  brain]
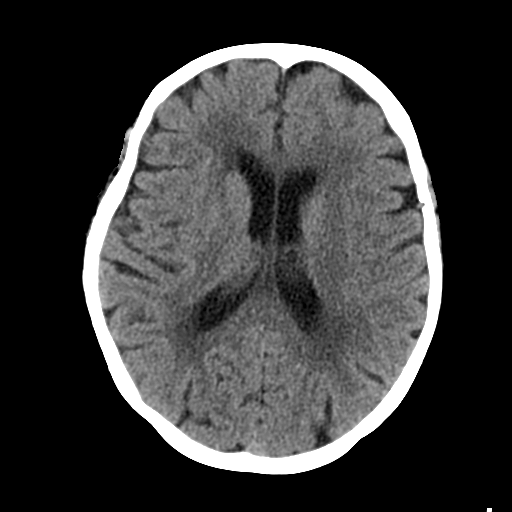
[im 17/32  bone]
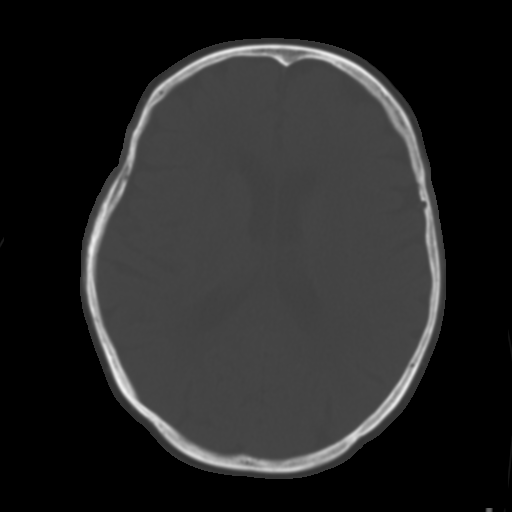
[im 20/32  brain]
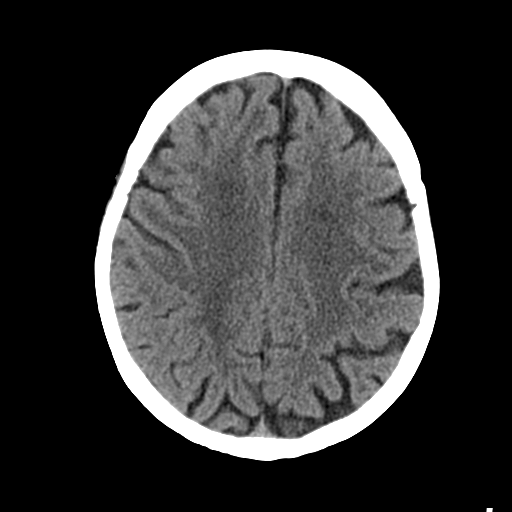
[im 23/32  brain]
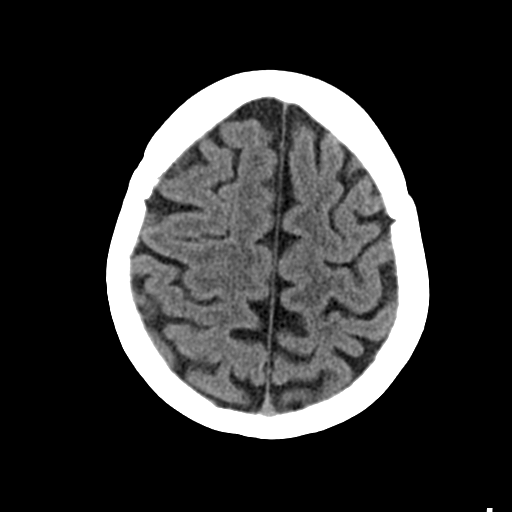
[im 26/32  brain]
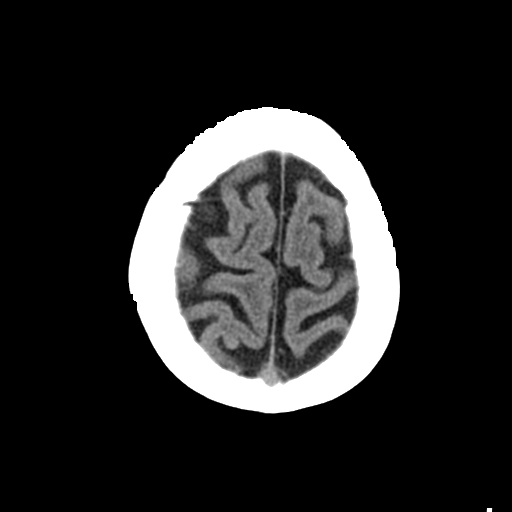
[im 29/32  brain]
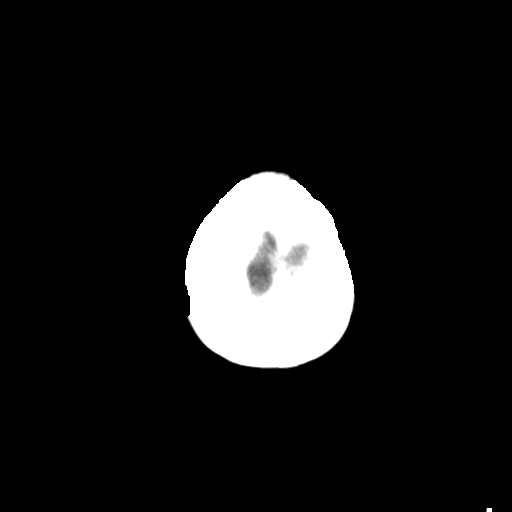
[im 29/32  bone]
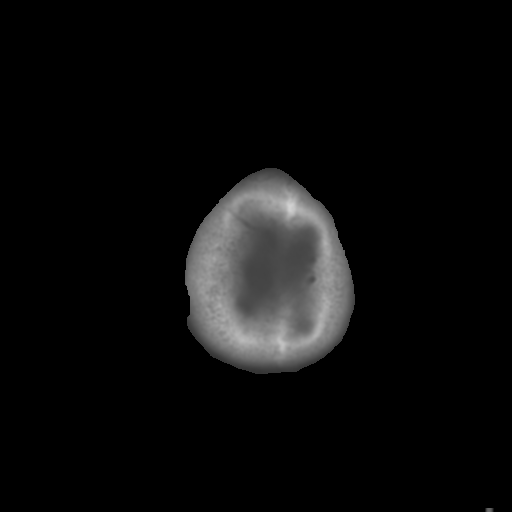

[Series 4: coronal soft · coronal · 0.34mm/px · 3 of 68 slices shown]
[im 23/68  brain]
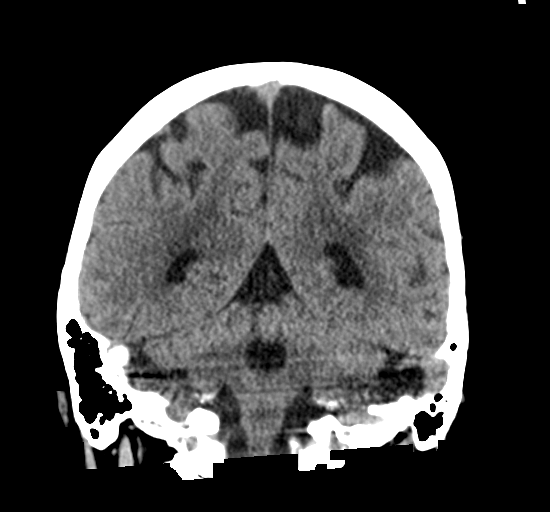
[im 30/68  brain]
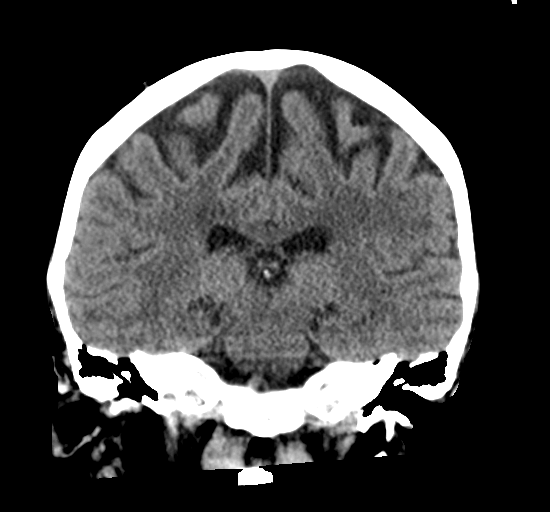
[im 38/68  brain]
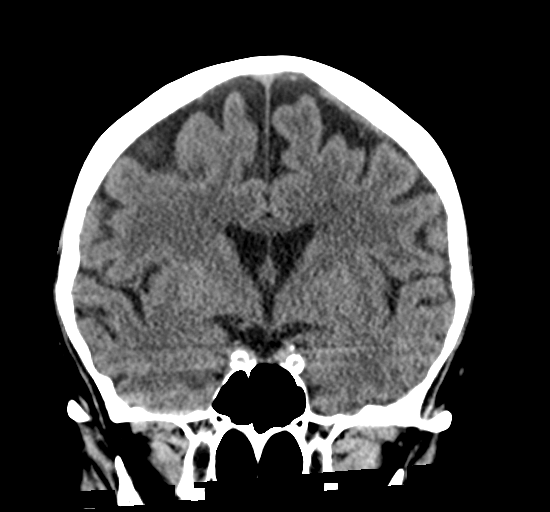

[Series 5: sagittal soft · sagittal · 0.36mm/px · 3 of 67 slices shown]
[im 23/67  brain]
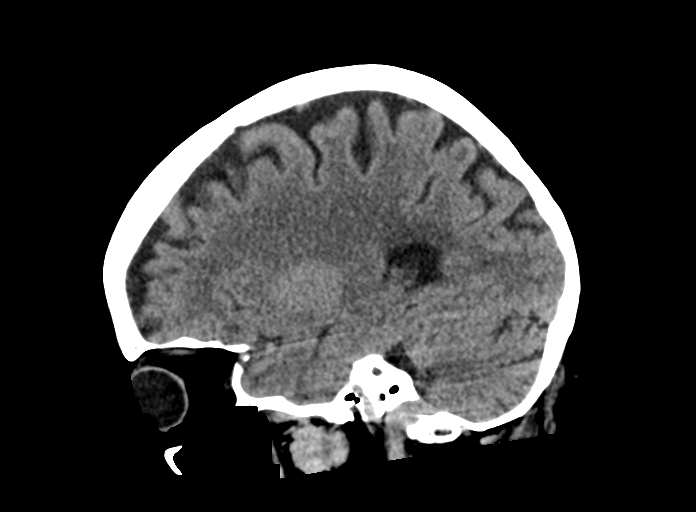
[im 34/67  brain]
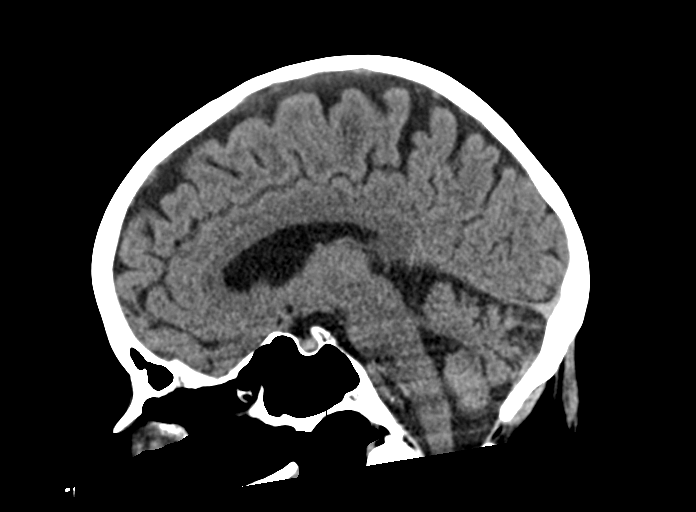
[im 45/67  brain]
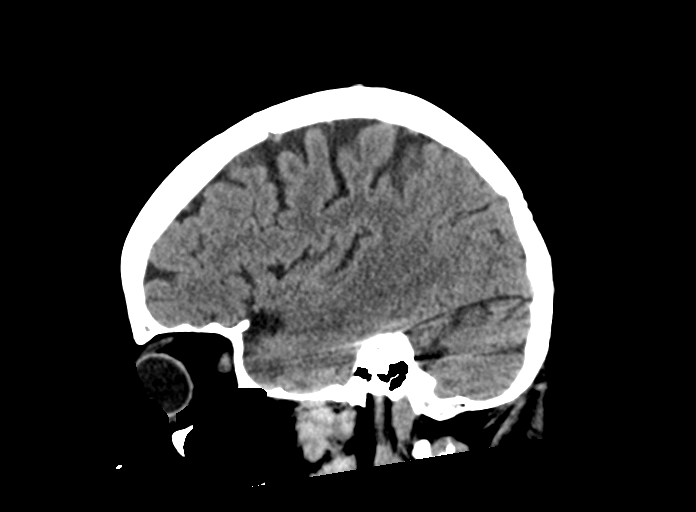

[15 of 47 positions shown; findings below may reference images not displayed]

FINDINGS: Brain: Age-related cerebral atrophy with chronic small vessel
ischemic disease. Small remote lacunar infarct at the right caudate.
No acute intracranial hemorrhage. No acute large vessel territory
infarct. No mass lesion or mass effect. No hydrocephalus or extra
fluid collection.

Vascular: No hyperdense vessel. Scattered vascular calcifications
noted within the carotid siphons.

Skull: Scalp soft tissues and calvarium within normal limits.

Sinuses/Orbits: Globes orbital soft tissues demonstrate no acute
finding. Paranasal sinuses are clear. No mastoid effusion.

Other: None.

ASPECTS (Alberta Stroke Program Early CT Score)

- Ganglionic level infarction (caudate, lentiform nuclei, internal
capsule, insula, M1-M3 cortex): 7

- Supraganglionic infarction (M4-M6 cortex): 3

Total score (0-10 with 10 being normal): 10
IMPRESSION: 1. No acute intracranial infarct or other abnormality.
2. ASPECTS is 10.
3. Age-related cerebral atrophy with chronic microvascular ischemic
disease. Small remote lacunar infarct at the right caudate.

Critical Value/emergent results were called by telephone at the time
of interpretation on [DATE] at [DATE] to provider KANDAN ,
who verbally acknowledged these results.

## 2021-11-03 NOTE — ED Triage Notes (Signed)
Pt c/o numbness to face since 1930. She was seen recently and diagnosed with tia.

## 2021-11-03 NOTE — Consult Note (Signed)
[facility] Tele[facility]rology Consult Services   Patient Name:   [name], [name] Date of Birth:   [birth year] Identification Number:   MRN - [id number] Date of Service:   11/03/2021 21:05:51  Diagnosis:       G45.9 - Transient cerebral ischemic attack, unspecified  Impression:      70yo F with hx of prior stroke 1 month ago, bilateral carotid stenosis, migraines, Afib on Eliquis, CAD, HTN, HL, PAD s/p failed bypass s/p R AKA, presenting with L-sided numbness, now resolved. Teleneuro exam is currently back to baseline; NIHSS 0. CT head shows no acute abnormalities. Symptoms may be due to TIA vs recrudescence of prior stroke vs complex migraine. Thrombolytic therapy was not offered because patient's symptoms resolved.  Metrics: Last Known Well: 11/03/2021 19:30:00 [facility] Notification Time: 11/03/2021 21:05:51 Arrival Time: 11/03/2021 20:43:00 Stamp Time: 11/03/2021 21:05:51 Initial Response Time: 11/03/2021 21:10:22 Symptoms: L-sided numbness. NIHSS Start Assessment Time: 11/03/2021 21:16:04 Patient is not a candidate for Thrombolytic. Thrombolytic Medical Decision: 11/03/2021 21:12:04 Patient was not deemed candidate for Thrombolytic because of following reasons: Use of NOAs within 48 hours. Resolved symptoms (no residual disabling symptoms). Stroke in last 3 months.  CT head showed no acute hemorrhage or acute core infarct.  ED Physician notified of diagnostic impression and management plan on 11/03/2021 21:37:37  Advanced Imaging: Advanced Imaging Not Completed because:  Symptoms resolved, NIHSS 0   Our recommendations are outlined below.  Recommendations:       - Cardiac monitor       - q4h neuro checks       - Gradually target normotension       - NPO until swallow screen, HOB 30 degrees       - Ok to continue Eliquis for now       - MRI brain without gad       - CTA head and neck       - Check fasting lipids, HbA1c, troponin       - Maintain  glucose <180       - High intensity statin for LDL goal <70       - PT/OT/ST  Routine Consultation with [facility] for Follow up Care  Sign Out:       Discussed with Emergency Department Provider    ------------------------------------------------------------------------------  History of Present Illness: Patient is a 70 year old Female.  Patient was brought by EMS for symptoms of L-sided numbness.  70yo F with hx of prior stroke 1 month ago, bilateral carotid stenosis, migraines, Afib on Eliquis, CAD, HTN, HL, PAD s/p failed bypass s/p R AKA presenting with L-sided numbness. Patient states she felt fine today up until about 19:30. Initially she noticed some numbness in her L hand. This gradually spread up her arm to the L side her neck and face and then down to her L leg. She felt she had a facial droop and her speech was a little slurred. She also reports a severe R-sided headache. She called EMS to bring her in to the hospital, and once here her numbness started resolving. Currently her numbness has completely resolved and she feels back to normal except for a R-sided migraine headache. Patient had very similar symptoms last month. She underwent an MRI which showed a small acute/subacute stroke within the R centrum semiovale. She was also found to have a carotid stenosis 50-69% on the R and >70% on the L side. She recently saw a vascular surgeon who may want to do  a CEA.    Past Medical History:      Hypertension      Hyperlipidemia      Atrial Fibrillation      Coronary Artery Disease      Stroke      There is no history of Diabetes Mellitus      There is no history of Covid-19      migraines  Social History:Unable to obtain due to Patient Status  Family History:Unable to obtain due to Patient Status  Anticoagulant use:  Yes Eliquis  No Antiplatelet use  Allergies:  Reviewed     Examination: BP(199/67), Pulse(70), Blood Glucose(78) 1A: Level of Consciousness -  Alert; keenly responsive + 0 1B: Ask Month and Age - Both Questions Right + 0 1C: Blink Eyes & Squeeze Hands - Performs Both Tasks + 0 2: Test Horizontal Extraocular Movements - Normal + 0 3: Test Visual Fields - No Visual Loss + 0 4: Test Facial Palsy (Use Grimace if Obtunded) - Normal symmetry + 0 5A: Test Left Arm Motor Drift - No Drift for 10 Seconds + 0 5B: Test Right Arm Motor Drift - No Drift for 10 Seconds + 0 6A: Test Left Leg Motor Drift - No Drift for 5 Seconds + 0 6B: Test Right Leg Motor Drift - Amputation/Joint Fusion + 0 7: Test Limb Ataxia (FNF/Heel-Shin) - No Ataxia + 0 8: Test Sensation - Normal; No sensory loss + 0 9: Test Language/Aphasia - Normal; No aphasia + 0 10: Test Dysarthria - Normal + 0 11: Test Extinction/Inattention - No abnormality + 0  NIHSS Score: 0   Pre-Morbid Modified Rankin Scale: 0 Points = No symptoms at all   Patient/Family was informed the Neurology Consult would occur via TeleHealth consult by way of interactive audio and video telecommunications and consented to receiving care in this manner.   Patient is being evaluated for possible acute neurologic impairment and high probability of imminent or life-threatening deterioration. I spent total of 45 minutes providing care to this patient, including time for face to face visit via telemedicine, review of medical records, imaging studies and discussion of findings with providers, the patient and/or family.   Dr Damaris Hippo   [facility] [phone]  Case [id number]
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
## 2021-11-04 ENCOUNTER — Observation Stay (HOSPITAL_COMMUNITY): Payer: 59

## 2021-11-04 ENCOUNTER — Other Ambulatory Visit (HOSPITAL_COMMUNITY): Payer: Self-pay | Admitting: *Deleted

## 2021-11-04 ENCOUNTER — Observation Stay (HOSPITAL_BASED_OUTPATIENT_CLINIC_OR_DEPARTMENT_OTHER): Payer: 59

## 2021-11-04 DIAGNOSIS — Z8673 Personal history of transient ischemic attack (TIA), and cerebral infarction without residual deficits: Secondary | ICD-10-CM | POA: Diagnosis not present

## 2021-11-04 DIAGNOSIS — I10 Essential (primary) hypertension: Secondary | ICD-10-CM | POA: Diagnosis not present

## 2021-11-04 DIAGNOSIS — G459 Transient cerebral ischemic attack, unspecified: Secondary | ICD-10-CM

## 2021-11-04 DIAGNOSIS — Z89611 Acquired absence of right leg above knee: Secondary | ICD-10-CM

## 2021-11-04 DIAGNOSIS — I480 Paroxysmal atrial fibrillation: Secondary | ICD-10-CM

## 2021-11-04 LAB — ECHOCARDIOGRAM COMPLETE
AR max vel: 2.63 cm2
AV Area VTI: 2.43 cm2
AV Area mean vel: 2.23 cm2
AV Mean grad: 6 mmHg
AV Peak grad: 10.9 mmHg
Ao pk vel: 1.65 m/s
Area-P 1/2: 3.19 cm2
Calc EF: 70.1 %
Height: 64.5 in
MV VTI: 2.63 cm2
S' Lateral: 2.6 cm
Single Plane A2C EF: 69.9 %
Single Plane A4C EF: 69.9 %
Weight: 2197.55 oz

## 2021-11-04 LAB — URINALYSIS, ROUTINE W REFLEX MICROSCOPIC
Bilirubin Urine: NEGATIVE
Glucose, UA: 50 mg/dL — AB
Hgb urine dipstick: NEGATIVE
Ketones, ur: NEGATIVE mg/dL
Leukocytes,Ua: NEGATIVE
Nitrite: NEGATIVE
Protein, ur: >=300 mg/dL — AB
Specific Gravity, Urine: 1.005 (ref 1.005–1.030)
pH: 7 (ref 5.0–8.0)

## 2021-11-04 LAB — LIPID PANEL
Cholesterol: 173 mg/dL (ref 0–200)
HDL: 35 mg/dL — ABNORMAL LOW (ref >40)
LDL Cholesterol: 99 mg/dL (ref 0–99)
Total CHOL/HDL Ratio: 4.9 RATIO
Triglycerides: 193 mg/dL — ABNORMAL HIGH (ref <150)
VLDL: 39 mg/dL (ref 0–40)

## 2021-11-04 LAB — COMPREHENSIVE METABOLIC PANEL
ALT: 16 U/L (ref 0–44)
AST: 27 U/L (ref 15–41)
Albumin: 2.8 g/dL — ABNORMAL LOW (ref 3.5–5.0)
Alkaline Phosphatase: 57 U/L (ref 38–126)
Anion gap: 8 (ref 5–15)
BUN: 29 mg/dL — ABNORMAL HIGH (ref 8–23)
CO2: 20 mmol/L — ABNORMAL LOW (ref 22–32)
Calcium: 7.9 mg/dL — ABNORMAL LOW (ref 8.9–10.3)
Chloride: 106 mmol/L (ref 98–111)
Creatinine, Ser: 1.09 mg/dL — ABNORMAL HIGH (ref 0.44–1.00)
GFR, Estimated: 55 mL/min — ABNORMAL LOW (ref >60)
Glucose, Bld: 91 mg/dL (ref 70–99)
Potassium: 4.9 mmol/L (ref 3.5–5.1)
Sodium: 134 mmol/L — ABNORMAL LOW (ref 135–145)
Total Bilirubin: 0.6 mg/dL (ref 0.3–1.2)
Total Protein: 5.8 g/dL — ABNORMAL LOW (ref 6.5–8.1)

## 2021-11-04 LAB — RAPID URINE DRUG SCREEN, HOSP PERFORMED
Amphetamines: NOT DETECTED
Barbiturates: NOT DETECTED
Benzodiazepines: NOT DETECTED
Cocaine: NOT DETECTED
Opiates: NOT DETECTED
Tetrahydrocannabinol: NOT DETECTED

## 2021-11-04 LAB — TROPONIN I (HIGH SENSITIVITY)
Troponin I (High Sensitivity): 9 ng/L (ref <18)
Troponin I (High Sensitivity): 8 ng/L (ref <18)

## 2021-11-04 LAB — CBC
HCT: 39.8 % (ref 36.0–46.0)
Hemoglobin: 11.4 g/dL — ABNORMAL LOW (ref 12.0–15.0)
MCH: 23.1 pg — ABNORMAL LOW (ref 26.0–34.0)
MCHC: 28.6 g/dL — ABNORMAL LOW (ref 30.0–36.0)
MCV: 80.7 fL (ref 80.0–100.0)
Platelets: 293 10*3/uL (ref 150–400)
RBC: 4.93 MIL/uL (ref 3.87–5.11)
RDW: 20 % — ABNORMAL HIGH (ref 11.5–15.5)
WBC: 11.9 10*3/uL — ABNORMAL HIGH (ref 4.0–10.5)
nRBC: 0 % (ref 0.0–0.2)

## 2021-11-04 LAB — HEMOGLOBIN A1C
Hgb A1c MFr Bld: 4.9 % (ref 4.8–5.6)
Mean Plasma Glucose: 93.93 mg/dL

## 2021-11-04 IMAGING — CT CT ANGIO HEAD
2 of 7 series · 8 of 33 positions shown · IV contrast (Omnipaque or Isovue)
Comparison: Head and neck MRA [DATE]

CLINICAL DATA: Stroke/TIA, assess intracranial arteries.

EXAM:
CT ANGIOGRAPHY HEAD AND NECK
TECHNIQUE: Multidetector CT imaging of the head and neck was performed using
the standard protocol during bolus administration of intravenous
contrast. Multiplanar CT image reconstructions and MIPs were
obtained to evaluate the vascular anatomy. Carotid stenosis
measurements (when applicable) are obtained utilizing NASCET
criteria, using the distal internal carotid diameter as the
denominator.
CONTRAST:  75mL OMNIPAQUE IOHEXOL 350 MG/ML SOLN

[Series 4: cta head & neck · axial · 0.48mm/px · z∈[-62,+52]mm · 2 of 173 slices shown]
[im 58/173  soft-tissue]
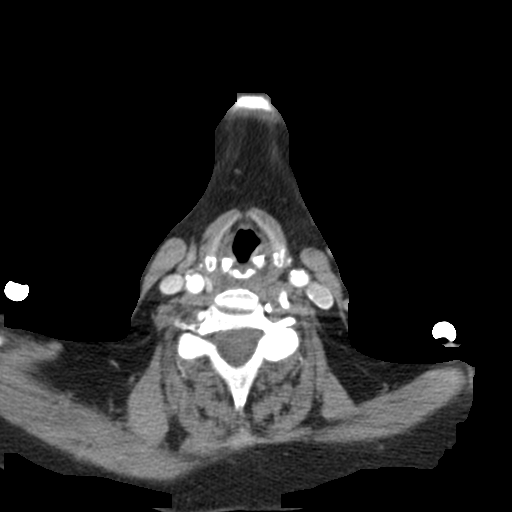
[im 115/173  bone]
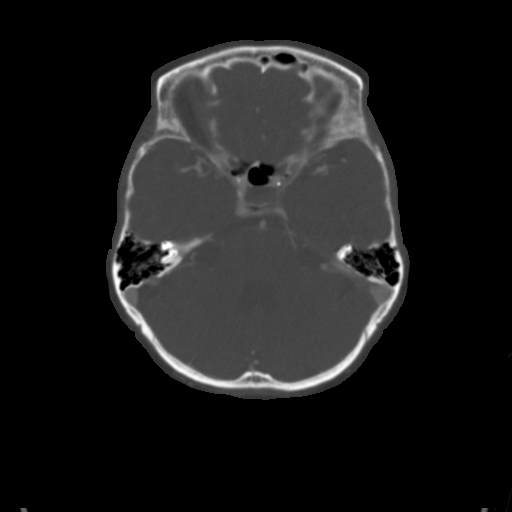

[Series 6: ax thins · axial · 0.39mm/px · z∈[-128,+112]mm · 6 of 338 slices shown]
[im 49/338  soft-tissue]
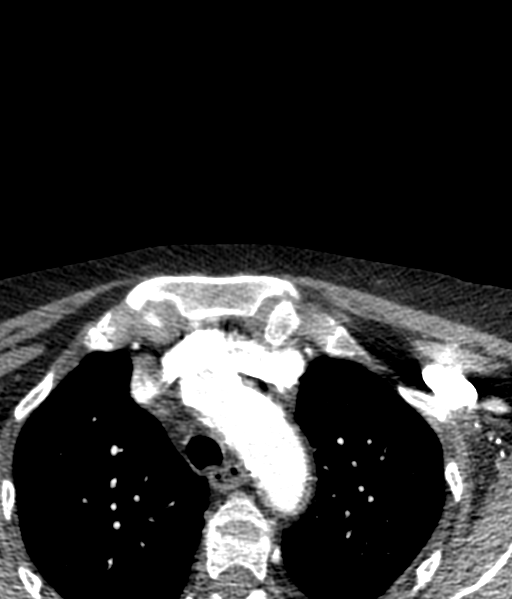
[im 97/338  soft-tissue]
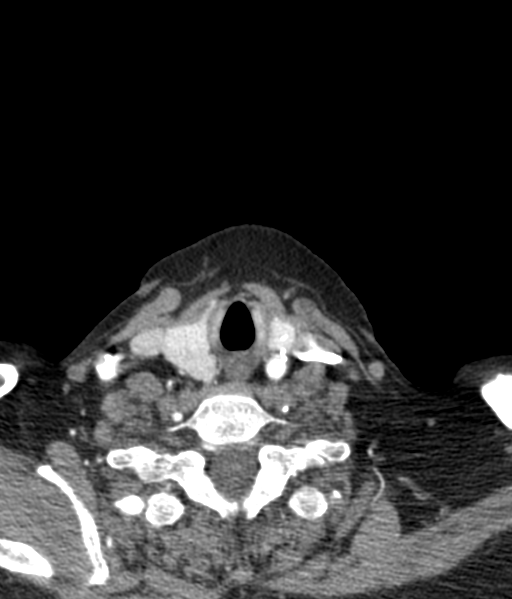
[im 145/338  soft-tissue]
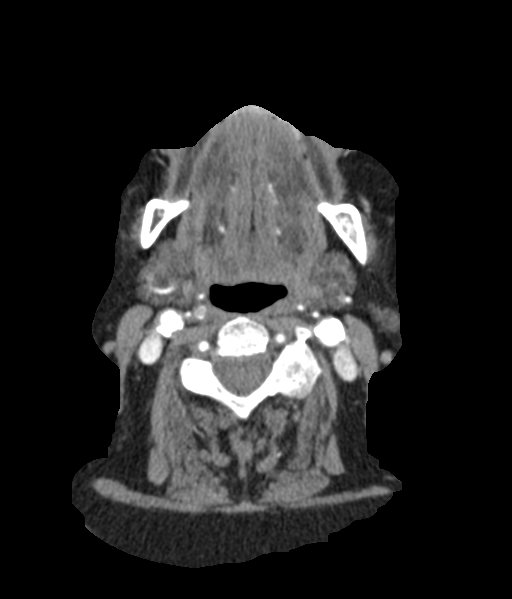
[im 193/338  soft-tissue]
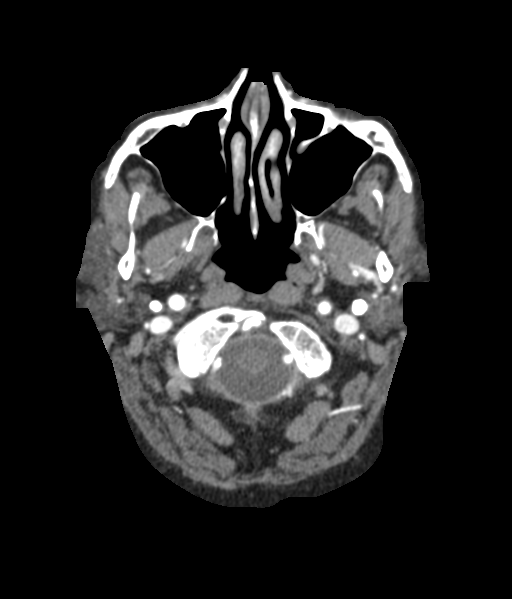
[im 241/338  soft-tissue]
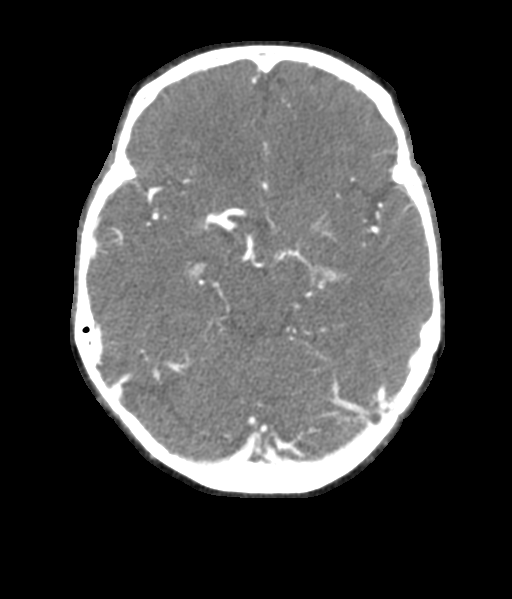
[im 289/338  soft-tissue]
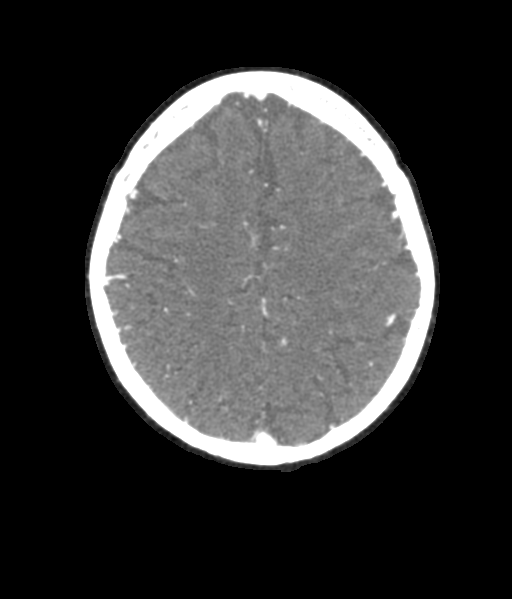

[8 of 33 positions shown; findings below may reference images not displayed]

FINDINGS: CTA NECK FINDINGS

Aortic arch: Standard 3 vessel aortic arch with moderate calcified
plaque. Suboptimal assessment of the left subclavian artery origin
due to motion artifact and calcified plaque without evidence of a
high-grade origin stenosis on the earlier MRA. Additional, heavily
calcified plaque in the left subclavian artery immediately proximal
to the left vertebral origin resulting in a severe left subclavian
stenosis as seen on MRA. Quantification of the degree of stenosis is
limited by the heavily calcified plaque and motion artifact
resulting in poor visualization of the thin residual patent lumen.
Calcified plaque in the brachiocephalic and right subclavian
arteries without flow limiting stenosis.

Right carotid system: Patent with scattered, predominantly calcified
plaque throughout the common carotid artery and in the carotid bulb.
No evidence of a significant stenosis or dissection.

Left carotid system: Patent with suboptimal assessment of the
proximal common carotid artery due to motion and streak artifact.
Calcified and soft plaque throughout the mid and distal common
carotid artery without significant stenosis. Calcified and soft
plaque in the proximal ICA results in 60% stenosis.

Vertebral arteries: The right vertebral artery is patent without
evidence of a significant stenosis or dissection. As seen on the
MRA, the left vertebral artery is occluded at its origin with distal
reconstitution beginning in the mid V2 segment. Atherosclerotic
plaque results in a severe left P2 stenosis at C2-3 as well as
moderate V3 stenosis.

Skeleton: Advanced cervical facet arthrosis, particularly severe on
the right at C2-3 and on the left at C5-6. Left facet ankylosis at
C3-4.

Other neck: 8 mm cyst at the anterior aspect of the left palatine
tonsil. No evidence of cervical lymphadenopathy.

Upper chest: No apical lung consolidation or mass.

Review of the MIP images confirms the above findings

CTA HEAD FINDINGS

Anterior circulation: The internal carotid arteries are patent from
skull base to carotid termini with siphon atherosclerosis resulting
in luminal irregularity and up to mild stenosis bilaterally. ACAs
and MCAs are patent without evidence of a proximal branch occlusion
or flow limiting proximal stenosis. The left A1 segment is
hypoplastic. No aneurysm is identified.

Posterior circulation: The intracranial vertebral arteries are
patent to the basilar with atherosclerosis of the left greater than
right V4 segments. There is a severe proximal left V4 stenosis.
Patent AICA and SCA origins are seen bilaterally. The basilar artery
is patent with a mild stenosis in its midportion. There are large
right and small left posterior communicating arteries. Both PCAs are
patent. There is a moderate distal left P1 stenosis. No aneurysm is
identified.

Venous sinuses: Patent.

Anatomic variants: Predominantly fetal type origin of the right PCA.
Hypoplastic left A1.

Review of the MIP images confirms the above findings
IMPRESSION: 1. Widespread atherosclerosis including a severe left subclavian
artery stenosis proximal to the left vertebral artery origin.
2. Left vertebral artery occlusion at its origin with distal
reconstitution.
3. 60% proximal left ICA stenosis.
4. Intracranial atherosclerosis as above.
5. Aortic Atherosclerosis ([SX]-[SX]).

## 2021-11-04 IMAGING — MR MR HEAD W/O CM
15 of 17 series · 34 of 48 positions shown · IV contrast (agent unspecified)
Comparison: Noncontrast CT head dated 1 day prior, MRA head
[DATE], carotid Doppler [DATE]

CLINICAL DATA: Bilateral weakness for 2 weeks, stroke suspected

EXAM:
MRI HEAD WITH CONTRAST
MRA HEAD WITH CONTRAST
MRI NECK WITHOUT AND WITH CONTRAST
TECHNIQUE: Multiplanar, multiecho pulse sequences of the brain and surrounding
structures were obtained with intravenous contrast. Angiographic
images of the head were obtained using MRA technique with contrast.
Multiplanar, multiecho pulse sequences of the neck and surrounding
structures were obtained without and with intravenous contrast.

[Series 5: DWI · axial · 4.0mm · 0.88mm/px · z∈[+8,+132]mm · 2 of 32 slices shown (1 of 6)]
[im 1/32]
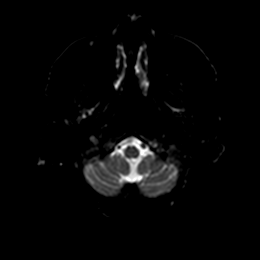
[im 32/32]
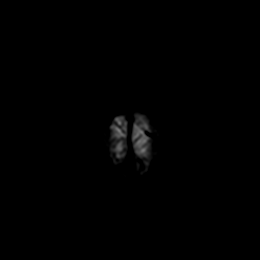

[Series 5: DWI · axial · 4.0mm · 0.88mm/px · z∈[+8,+132]mm · 2 of 32 slices shown (2 of 6)]
[im 1/32]
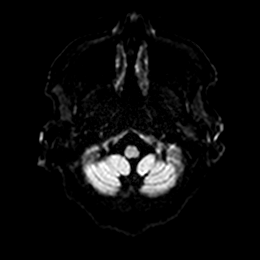
[im 32/32]
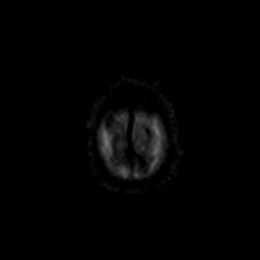

[Series 6: DWI · axial · 4.0mm · 0.88mm/px · z∈[+8,+132]mm · 2 of 32 slices shown (3 of 6)]
[im 1/32]
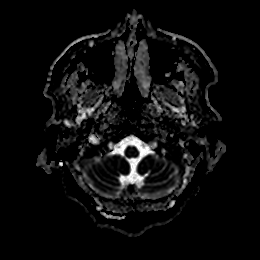
[im 32/32]
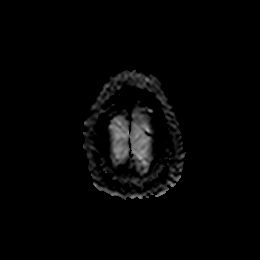

[Series 7: DWI · coronal · 5.0mm · 0.88mm/px · 1 of 28 slices shown (4 of 6)]
[im 1/28]
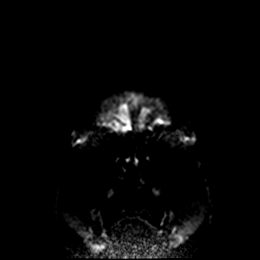

[Series 7: DWI · coronal · 5.0mm · 0.88mm/px · 1 of 28 slices shown (5 of 6)]
[im 1/28]
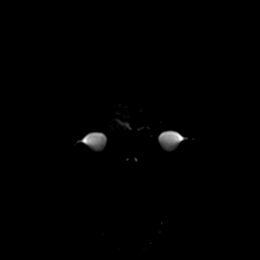

[Series 8: DWI · coronal · 5.0mm · 0.88mm/px · 1 of 28 slices shown (6 of 6)]
[im 1/28]
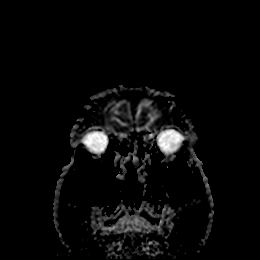

[Series 9: T1 · sagittal · 5.0mm · 0.94mm/px · 1 of 25 slices shown]
[im 1/25]
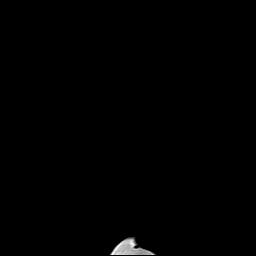

[Series 15: T2 · axial · 5.0mm · 0.72mm/px · 1 of 20 slices shown (1 of 2)]
[im 1/20]
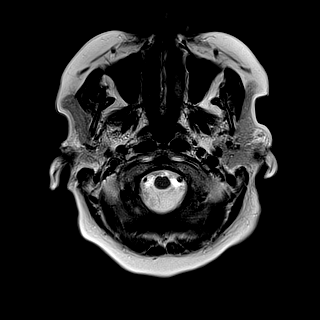

[Series 16: ax hemo · axial · 5.0mm · 0.86mm/px · 1 of 25 slices shown]
[im 1/25]
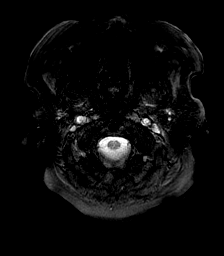

[Series 17: FLAIR · axial · 4.0mm · 0.43mm/px · 1 of 32 slices shown]
[im 1/32]
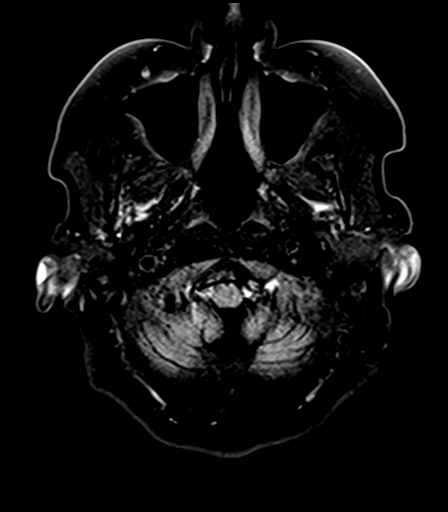

[Series 19: T2 · coronal · 5.0mm · 0.72mm/px · 1 of 28 slices shown (2 of 2)]
[im 1/28]
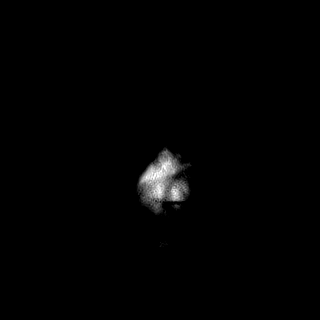

[Series 28: tof_fl3d_tra_iso · axial · 0.6mm · 0.52mm/px · z∈[-154,-38]mm · 9 of 195 slices shown]
[im 1/195]
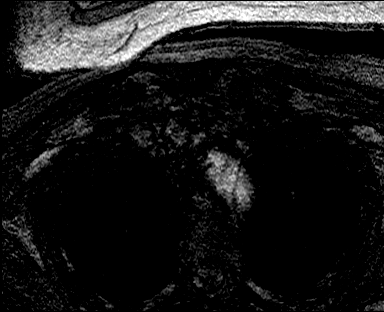
[im 25/195]
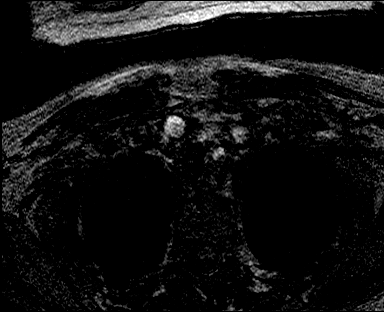
[im 49/195]
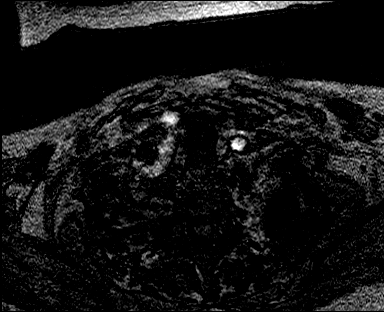
[im 73/195]
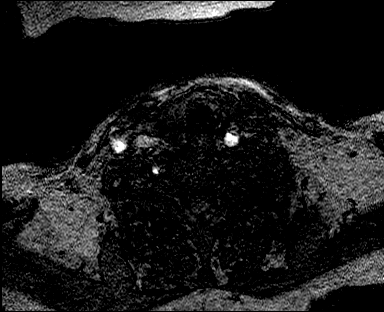
[im 98/195]
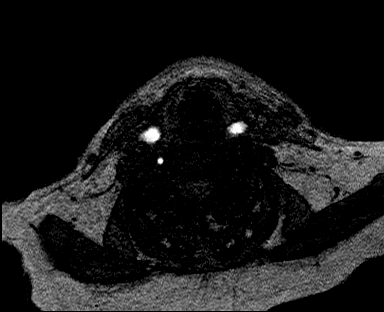
[im 122/195]
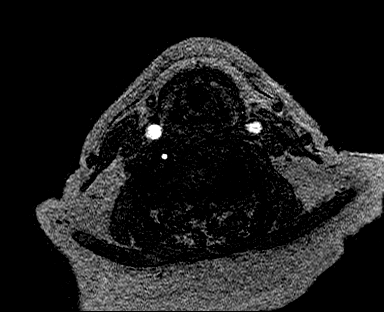
[im 146/195]
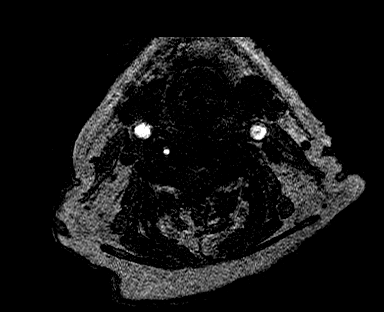
[im 170/195]
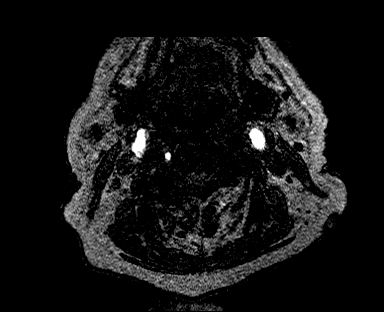
[im 195/195]
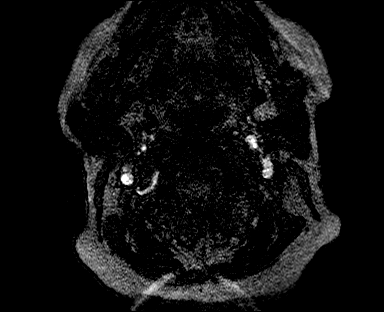

[Series 31: angio_fl3d_cor_pre_ttc=3.0s · coronal · 0.9mm · 0.85mm/px · 4 of 80 slices shown]
[im 1/80]
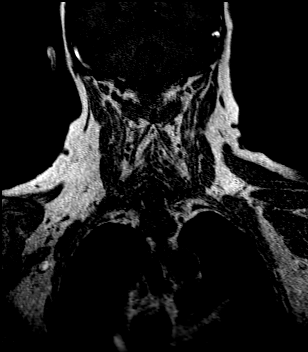
[im 27/80]
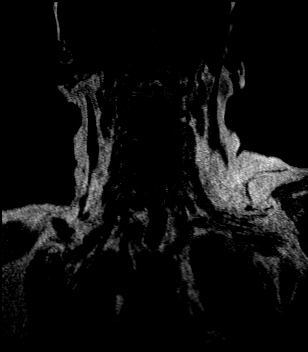
[im 53/80]
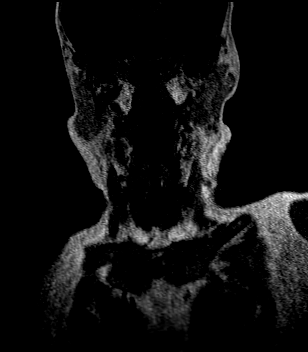
[im 80/80]
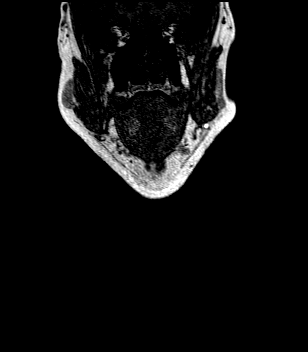

[Series 33: angio_fl3d_cor_post_ttc=3.0s · coronal · 0.9mm · 0.85mm/px · 4 of 80 slices shown]
[im 1/80]
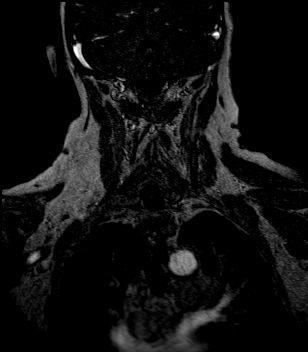
[im 27/80]
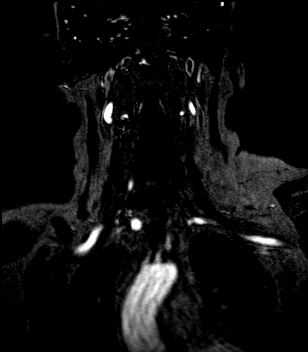
[im 53/80]
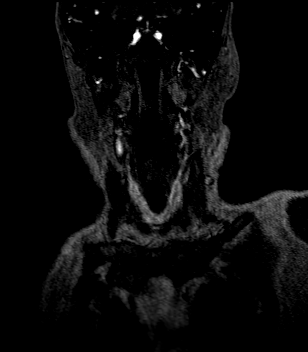
[im 80/80]
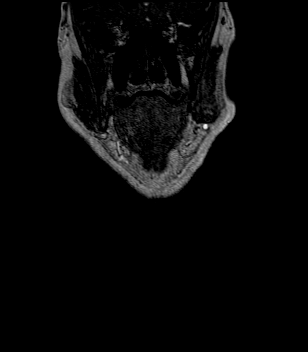

[Series 35: angio_fl3d_cor_post_ttc=3.0s_moco-adv_sub · coronal · 0.9mm · 0.85mm/px · 3 of 73 slices shown]
[im 1/73]
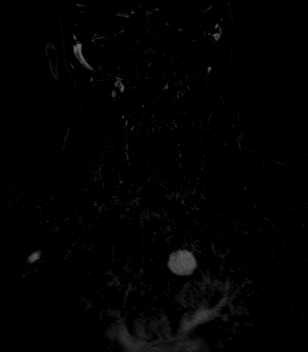
[im 37/73]
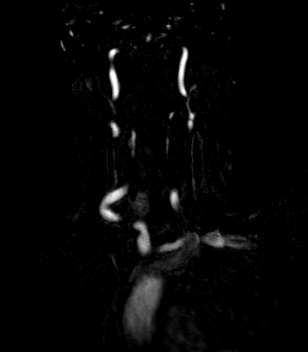
[im 73/73]
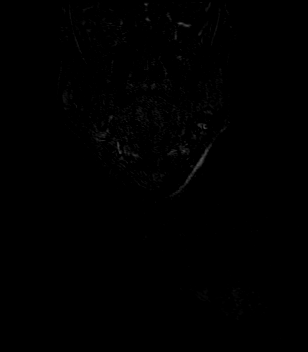

[34 of 48 positions shown; findings below may reference images not displayed]

FINDINGS: MRI HEAD FINDINGS

Brain: There is no evidence of acute intracranial hemorrhage,
extra-axial fluid collection, or acute infarct.

There is mild parenchymal volume loss. The ventricles are not
enlarged. There is scattered FLAIR signal abnormality throughout the
subcortical and periventricular white matter likely reflecting
sequela of chronic white matter microangiopathy. There is no
suspicious parenchymal signal abnormality.

There is no mass lesion.  There is no midline shift.

Vascular: Normal flow voids.

Skull and upper cervical spine: Normal marrow signal.

Sinuses/Orbits: The imaged paranasal sinuses are clear. Bilateral
lens implants are in place. The globes and orbits are otherwise
unremarkable.

Other: None.

MRA HEAD FINDINGS

Anterior circulation: The bilateral intracranial ICAs are patent
with mild multifocal irregularity likely reflecting atherosclerotic
disease. There is no high-grade stenosis or occlusion.

The bilateral MCAs are patent.

The left A1 segment is diminutive, unchanged. There is focal
moderate stenosis of the left A2 segment, unchanged (10-150). Focal
stenosis of the right A2 segment is also not significantly changed
(10-148). There is no evidence of occlusion.

There is no aneurysm.

Posterior circulation: The proximal left V4 segment again
demonstrates no flow related enhancement. There is flow related
enhancement of the distal left V4 segment proximal to the
confluence. There is mild irregularity of the right V4 segment
without high-grade stenosis or occlusion. The basilar artery is
patent with mild multifocal irregularity and narrowing.

There is unchanged focal severe stenosis at the left P1/P2 junction
(10-135). There is also unchanged no set the right P1/P2 junction.
The right PCA is again supplied by a prominent posterior
communicating artery. There is moderate stenosis of the right P2
segment (10-126). The PCAs are otherwise patent, without evidence of
occlusion. There is no aneurysm.

Anatomic variants: As above.

MRA NECK FINDINGS

Aortic arch: The imaged portions of the aortic arch are
unremarkable. There is focal high-grade stenosis of the left
subclavian artery (35-62), with a thin trickle of contrast
apparently traversing the stenosis. The right brachiocephalic and
subclavian arteries are unremarkable.

Right carotid system: There is irregularity of the proximal right
internal carotid artery resulting in approximately 30-40% stenosis
(though this may be an underestimate given findings on recent
carotid ultrasound). There is no evidence of dissection or aneurysm.

Left carotid system: There is irregularity and narrowing of the left
proximal internal carotid artery resulting in approximately 70%
stenosis. There is no evidence of dissection or aneurysm.

Vertebral arteries: The left vertebral artery is occluded from its
origin through the mid to distal V2 segment on the postcontrast
images. There is no flow related enhancement within the imaged
portions of the vertebral artery on the time-of-flight sequence.
There is postcontrast enhancement of the distal V2, V3, and V4
segments with focal moderate stenosis of the V4 segment.

The right vertebral artery is patent with antegrade flow.
IMPRESSION: 1. No acute intracranial pathology.
2. Mild chronic white matter microangiopathy.
3. Multifocal stenosis of the intracranial vasculature as above,
overall not significantly changed compared to the MRA from
[DATE]. Specifically, focal stenosis of the bilateral A2
segments and bilateral PCAs are again seen. There is no evidence of
large vessel occlusion.
4. Focal severe stenosis of the left subclavian artery in the region
of the origin of the left vertebral artery. There is no flow related
enhancement within the vertebral artery, and there is no
postcontrast enhancement within the proximal vertebral artery from
the origin through the mid V2 segment, with reconstitution of
postcontrast enhancement in the distal vertebral artery. Given lack
of flow related enhancement and focal stenosis of the left
subclavian artery, findings could reflect subclavian steal.
5. Approximately 30-40% stenosis of the right proximal internal
carotid artery and 70% stenosis of the proximal left internal
carotid artery. Note that these findings may be underestimated given
findings on recent carotid ultrasound.

## 2021-11-04 MED ORDER — IOHEXOL 350 MG/ML SOLN
75.0000 mL | Freq: Once | INTRAVENOUS | Status: AC | PRN
  Administered 2021-11-04: 75 mL via INTRAVENOUS

## 2021-11-04 MED ORDER — APIXABAN 5 MG PO TABS
5.0000 mg | ORAL_TABLET | Freq: Two times a day (BID) | ORAL | Status: DC
  Administered 2021-11-04 (×2): 5 mg via ORAL
  Filled 2021-11-04 (×2): qty 1

## 2021-11-04 MED ORDER — ASPIRIN EC 325 MG PO TBEC
325.0000 mg | DELAYED_RELEASE_TABLET | Freq: Every day | ORAL | Status: DC
  Administered 2021-11-04: 325 mg via ORAL
  Filled 2021-11-04: qty 1

## 2021-11-04 MED ORDER — STROKE: EARLY STAGES OF RECOVERY BOOK: Freq: Once | Status: DC

## 2021-11-04 MED ORDER — FLECAINIDE ACETATE 50 MG PO TABS
50.0000 mg | ORAL_TABLET | Freq: Two times a day (BID) | ORAL | Status: DC
  Administered 2021-11-04: 50 mg via ORAL
  Filled 2021-11-04: qty 1

## 2021-11-04 MED ORDER — ACETAMINOPHEN 650 MG RE SUPP: 650.0000 mg | RECTAL | Status: DC | PRN

## 2021-11-04 MED ORDER — EZETIMIBE 10 MG PO TABS
10.0000 mg | ORAL_TABLET | Freq: Every day | ORAL | Status: DC
  Administered 2021-11-04: 10 mg via ORAL
  Filled 2021-11-04: qty 1

## 2021-11-04 MED ORDER — ACETAMINOPHEN 325 MG PO TABS
650.0000 mg | ORAL_TABLET | ORAL | Status: DC | PRN
  Administered 2021-11-04 (×2): 650 mg via ORAL
  Filled 2021-11-04 (×2): qty 2

## 2021-11-04 MED ORDER — SENNOSIDES-DOCUSATE SODIUM 8.6-50 MG PO TABS: 1.0000 | ORAL_TABLET | Freq: Every evening | ORAL | Status: DC | PRN

## 2021-11-04 MED ORDER — DIPHENHYDRAMINE HCL 50 MG/ML IJ SOLN
12.5000 mg | Freq: Once | INTRAMUSCULAR | Status: AC
  Administered 2021-11-04: 12.5 mg via INTRAVENOUS
  Filled 2021-11-04: qty 1

## 2021-11-04 MED ORDER — GADOBUTROL 1 MMOL/ML IV SOLN
7.0000 mL | Freq: Once | INTRAVENOUS | Status: AC | PRN
  Administered 2021-11-04: 7 mL via INTRAVENOUS

## 2021-11-04 MED ORDER — KETOROLAC TROMETHAMINE 15 MG/ML IJ SOLN
15.0000 mg | Freq: Once | INTRAMUSCULAR | Status: AC
Start: 2021-11-04 — End: 2021-11-04
  Administered 2021-11-04: 15 mg via INTRAVENOUS
  Filled 2021-11-04: qty 1

## 2021-11-04 MED ORDER — ACETAMINOPHEN 160 MG/5ML PO SOLN: 650.0000 mg | ORAL | Status: DC | PRN

## 2021-11-04 MED ORDER — ESCITALOPRAM OXALATE 10 MG PO TABS
20.0000 mg | ORAL_TABLET | Freq: Every day | ORAL | Status: DC
  Administered 2021-11-04: 20 mg via ORAL
  Filled 2021-11-04: qty 2

## 2021-11-04 MED ORDER — METOCLOPRAMIDE HCL 5 MG/ML IJ SOLN
10.0000 mg | Freq: Once | INTRAMUSCULAR | Status: AC
  Administered 2021-11-04: 10 mg via INTRAVENOUS
  Filled 2021-11-04: qty 2

## 2021-11-04 MED ORDER — FENOFIBRATE 160 MG PO TABS
160.0000 mg | ORAL_TABLET | Freq: Every day | ORAL | Status: DC
  Administered 2021-11-04: 160 mg via ORAL
  Filled 2021-11-04: qty 1

## 2021-11-04 NOTE — Plan of Care (Signed)
  Problem: Education: Goal: Knowledge of disease or condition will improve Outcome: Adequate for Discharge Goal: Knowledge of secondary prevention will improve (SELECT ALL) Outcome: Adequate for Discharge Goal: Knowledge of patient specific risk factors will improve (INDIVIDUALIZE FOR PATIENT) Outcome: Adequate for Discharge Goal: Individualized Educational Video(s) Outcome: Adequate for Discharge   Problem: Coping: Goal: Will verbalize positive feelings about self Outcome: Adequate for Discharge Goal: Will identify appropriate support needs Outcome: Adequate for Discharge

## 2021-11-04 NOTE — Progress Notes (Signed)
*  PRELIMINARY RESULTS* Echocardiogram 2D Echocardiogram has been performed.  [name] 11/04/2021, 11:07 AM

## 2021-11-04 NOTE — ED Notes (Signed)
Report given to Bree RN 

## 2021-11-04 NOTE — Evaluation (Addendum)
Physical Therapy Evaluation Patient Details Name: [name] MRN: [id number] DOB: 05/24/1951 Today's Date: 11/04/2021  History of Present Illness  [name]  is a 70 y.o. female, with history of CAD, PAD, HTN, HLD, CVA, PAF, and more presents to the ED with a  chief complaint of left sided numbness.  Patient reports that she walks with a walker because of her right side prosthetic.  She was ambulating normally when she had sudden onset of numbness and tingling in her fingers, which spread to her arm, then face, then mouth, and then she noticed in her leg.  She reports that the sensation was definitely numbness.  In her mouth it felt like she had had a Novocain shot.  She had a left-sided facial droop.  She did not notice any slurred speech, but daughter at bedside reports that she did have slurred speech.  She reports associated headache.  She describes the headache as her right temple feeling like blunt force trauma.  Headache is not as bad as it was when she presented to the ED about a month ago.  She had no changes in vision, no changes in hearing.  She does report that it felt like she was drooling on the left side of her mouth.  It felt difficult to swallow but she did not have any choking spells.  Other times she arrived to the ER her numbness was gone except for in her face.  She reports that her face numbness is gone now, but she just feels like something is still off in her left upper and lower extremity.  She does not describe it as weakness or numbness, just something off.  Patient reports nothing made her symptoms worse, nothing resolved them.  It is similar to the episode that she had in October.  Patient has no other complaints at this time.   Clinical Impression  Patient functioning at baseline for functional mobility and gait. Patient ambulated to restroom in room and ambulated in hallway using RW w/out assist, demonstrated a good steady cadence w/ steadiness on feet. Patient  discharged to care of nursing for ambulation daily as tolerated for length of stay.        Recommendations for follow up therapy are one component of a multi-disciplinary discharge planning process, led by the attending physician.  Recommendations may be updated based on patient status, additional functional criteria and insurance authorization.  Follow Up Recommendations No PT follow up    Assistance Recommended at Discharge None  Functional Status Assessment Patient has not had a recent decline in their functional status  Equipment Recommendations  None recommended by PT    Recommendations for Other Services       Precautions / Restrictions Precautions Precautions: None Restrictions Weight Bearing Restrictions: No      Mobility  Bed Mobility Overal bed mobility: Modified Independent                  Transfers Overall transfer level: Modified independent Equipment used: Rolling walker (2 wheels)                    Ambulation/Gait Ambulation/Gait assistance: Modified independent (Device/Increase time) Gait Distance (Feet): 100 Feet Assistive device: Rolling walker (2 wheels) Gait Pattern/deviations: WFL(Within Functional Limits) Gait velocity: Decreased     General Gait Details: At baseline  Stairs            Wheelchair Mobility    Modified Rankin (Stroke Patients Only)  Balance Overall balance assessment: Needs assistance Sitting-balance support: No upper extremity supported;Feet supported Sitting balance-Leahy Scale: Good Sitting balance - Comments: seated at EOB   Standing balance support: Bilateral upper extremity supported;During functional activity;Reliant on assistive device for balance Standing balance-Leahy Scale: Fair Standing balance comment: fair/good using RW                             Pertinent Vitals/Pain Pain Assessment: No/denies pain    Home Living Family/patient expects to be discharged to::  Private residence Living Arrangements: Children;Non-relatives/Friends Available Help at Discharge: Family;Available PRN/intermittently Type of Home: House Home Access: Stairs to enter Entrance Stairs-Rails: None Entrance Stairs-Number of Steps: 2 Alternate Level Stairs-Number of Steps: 13 Home Layout: Two level;Able to live on main level with bedroom/bathroom Home Equipment: Rolling Walker (2 wheels);Wheelchair - manual;Toilet riser;Shower seat      Prior Function Prior Level of Function : Independent/Modified Independent                     Hand Dominance   Dominant Hand: Right    Extremity/Trunk Assessment   Upper Extremity Assessment Upper Extremity Assessment: Defer to OT evaluation    Lower Extremity Assessment Lower Extremity Assessment: Overall WFL for tasks assessed       Communication   Communication: No difficulties  Cognition Arousal/Alertness: Awake/alert Behavior During Therapy: WFL for tasks assessed/performed Overall Cognitive Status: Within Functional Limits for tasks assessed                                          General Comments      Exercises     Assessment/Plan    PT Assessment Patient does not need any further PT services  PT Problem List         PT Treatment Interventions      PT Goals (Current goals can be found in the Care Plan section)  Acute Rehab PT Goals Patient Stated Goal: Return home. PT Goal Formulation: With patient Time For Goal Achievement: 11/04/21 Potential to Achieve Goals: Good    Frequency     Barriers to discharge        Co-evaluation PT/OT/SLP Co-Evaluation/Treatment: Yes Reason for Co-Treatment: To address functional/ADL transfers PT goals addressed during session: Mobility/safety with mobility         AM-PAC PT "6 Clicks" Mobility  Outcome Measure Help needed turning from your back to your side while in a flat bed without using bedrails?: None Help needed moving from  lying on your back to sitting on the side of a flat bed without using bedrails?: None Help needed moving to and from a bed to a chair (including a wheelchair)?: None Help needed standing up from a chair using your arms (e.g., wheelchair or bedside chair)?: None Help needed to walk in hospital room?: None Help needed climbing 3-5 steps with a railing? : None 6 Click Score: 24    End of Session   Activity Tolerance: Patient tolerated treatment well;Patient limited by fatigue Patient left: in bed;Other (comment) (Patient was left w/ staff to be transported via a bed for imaging.) Nurse Communication: Mobility status PT Visit Diagnosis: Unsteadiness on feet (R26.81);Other abnormalities of gait and mobility (R26.89);Muscle weakness (generalized) (M62.81)    Time: 0354-6568 PT Time Calculation (min) (ACUTE ONLY): 22 min   Charges:   PT Evaluation $  PT Eval Moderate Complexity: 1 Mod PT Treatments $Therapeutic Activity: 8-22 mins        Cassie Jones, SPT  During this treatment session, the therapist was present, participating in and directing the treatment.  2:38 PM, 11/04/21 Lonell Grandchild, MPT Physical Therapist with [facility] [phone] office [phone] mobile phone

## 2021-11-04 NOTE — Progress Notes (Signed)
SLP Cancellation Note  Patient Details Name: [name] MRN: [id number] DOB: 03-11-1951   Cancelled treatment:       Reason Eval/Treat Not Completed: SLP screened, no needs identified, will sign off; SLP screened Pt in room. Pt denies any changes in swallowing, speech, language, or cognition. MRI negative for acute changes. SLE will be deferred at this time. SLE was administered during her admission in October. Reconsult if indicated. SLP will sign off.  Thank you,  Genene Churn, [place]   [place] 11/04/2021, 3:07 PM

## 2021-11-04 NOTE — Progress Notes (Signed)
Pt discharged from hospital. Discharge paperwork and belongings sent with pt. Education given to pt and expressed understanding. Pt propelled off unit by staff and daughter was driver. No distress noted at time of departure.

## 2021-11-04 NOTE — Evaluation (Signed)
Occupational Therapy Evaluation Patient Details Name: [name] MRN: [id number] DOB: May 26, 1951 Today's Date: 11/04/2021   History of Present Illness [name]  is a 70 y.o. female, with history of CAD, PAD, HTN, HLD, CVA, PAF, and more presents to the ED with a  chief complaint of left sided numbness.  Patient reports that she walks with a walker because of her right side prosthetic.  She was ambulating normally when she had sudden onset of numbness and tingling in her fingers, which spread to her arm, then face, then mouth, and then she noticed in her leg.  She reports that the sensation was definitely numbness.  In her mouth it felt like she had had a Novocain shot.  She had a left-sided facial droop.  She did not notice any slurred speech, but daughter at bedside reports that she did have slurred speech.  She reports associated headache.  She describes the headache as her right temple feeling like blunt force trauma.  Headache is not as bad as it was when she presented to the ED about a month ago.  She had no changes in vision, no changes in hearing.  She does report that it felt like she was drooling on the left side of her mouth.  It felt difficult to swallow but she did not have any choking spells.  Other times she arrived to the ER her numbness was gone except for in her face.  She reports that her face numbness is gone now, but she just feels like something is still off in her left upper and lower extremity.  She does not describe it as weakness or numbness, just something off.  Patient reports nothing made her symptoms worse, nothing resolved them.  It is similar to the episode that she had in October.  Patient has no other complaints at this time.   Clinical Impression   Pt agreeable to OT/PT co-evaluation. Pt reports feeling back to normal with no residual L side numbness or tingling. Pt able to demonstrate bed mobility and functional mobility with modified independence using RW and R LE  prosthetic. Pt demonstrates WFL UE strength and coordination. Pt appears to be at or near baseline levels. Pt is not recommended for further acute OT services and will be discharged to care of nursing staff for remaining length of stay.      Recommendations for follow up therapy are one component of a multi-disciplinary discharge planning process, led by the attending physician.  Recommendations may be updated based on patient status, additional functional criteria and insurance authorization.   Follow Up Recommendations  No OT follow up    Assistance Recommended at Discharge None  Functional Status Assessment  Patient has not had a recent decline in their functional status  Equipment Recommendations  None recommended by OT    Recommendations for Other Services       Precautions / Restrictions Precautions Precautions: None Precaution Comments: R LE prosthetic. Restrictions Weight Bearing Restrictions: No      Mobility Bed Mobility Overal bed mobility: Modified Independent                  Transfers Overall transfer level: Modified independent Equipment used: Rolling walker (2 wheels)                      Balance Overall balance assessment: Needs assistance Sitting-balance support: No upper extremity supported;Feet supported Sitting balance-Leahy Scale: Good Sitting balance - Comments: seated at EOB  Standing balance support: Bilateral upper extremity supported;During functional activity;Reliant on assistive device for balance Standing balance-Leahy Scale: Fair Standing balance comment: fair to good with RW                           ADL either performed or assessed with clinical judgement   ADL Overall ADL's : Modified independent                                             Vision Baseline Vision/History: 1 Wears glasses Ability to See in Adequate Light: 0 Adequate Patient Visual Report: Other (comment) (Pt reported  difficulty focusing for vision recently, but that vision was normal currently.) Vision Assessment?: No apparent visual deficits                Pertinent Vitals/Pain Pain Assessment: No/denies pain     Hand Dominance Right   Extremity/Trunk Assessment Upper Extremity Assessment Upper Extremity Assessment: Overall WFL for tasks assessed   Lower Extremity Assessment Lower Extremity Assessment: Defer to PT evaluation   Cervical / Trunk Assessment Cervical / Trunk Assessment: Normal   Communication Communication Communication: No difficulties   Cognition Arousal/Alertness: Awake/alert Behavior During Therapy: WFL for tasks assessed/performed Overall Cognitive Status: Within Functional Limits for tasks assessed                                                  Home Living Family/patient expects to be discharged to:: Private residence Living Arrangements: Children;Non-relatives/Friends Available Help at Discharge: Family;Available PRN/intermittently Type of Home: House Home Access: Stairs to enter [employer] of Steps: 2 Entrance Stairs-Rails: None Home Layout: Two level;Able to live on main level with bedroom/bathroom Alternate Level Stairs-Number of Steps: 13 Alternate Level Stairs-Rails: Right Bathroom Shower/Tub: Occupational psychologist: Standard Bathroom Accessibility: Yes   Home Equipment: Conservation officer, nature (2 wheels);Wheelchair - manual;Toilet riser;Shower seat          Prior Functioning/Environment Prior Level of Function : Independent/Modified Independent                    OT Goals(Current goals can be found in the care plan section) Acute Rehab OT Goals Patient Stated Goal: return home             Co-evaluation PT/OT/SLP Co-Evaluation/Treatment: Yes Reason for Co-Treatment: To address functional/ADL transfers   OT goals addressed during session: ADL's and self-care      AM-PAC OT "6 Clicks" Daily  Activity     Outcome Measure Help from another person eating meals?: None Help from another person taking care of personal grooming?: None Help from another person toileting, which includes using toliet, bedpan, or urinal?: None Help from another person bathing (including washing, rinsing, drying)?: None Help from another person to put on and taking off regular upper body clothing?: None Help from another person to put on and taking off regular lower body clothing?: None 6 Click Score: 24   End of Session Equipment Utilized During Treatment: Rolling walker (2 wheels)  Activity Tolerance: Patient tolerated treatment well Patient left: Other (comment) (Taken to MRI)  OT Visit Diagnosis: Unsteadiness on feet (R26.81);Other abnormalities of gait and mobility (R26.89);Other symptoms and  signs involving the nervous system (W58.099)                Time: 8338-2505 OT Time Calculation (min): 19 min Charges:  OT General Charges $OT Visit: 1 Visit OT Evaluation $OT Eval Low Complexity: 1 Low  Ladean Steinmeyer OT, MOT  Larey Seat 11/04/2021, 9:46 AM

## 2021-11-04 NOTE — Discharge Summary (Signed)
Physician Discharge Summary  [name] QPY:[id number] DOB: 12/07/1951 DOA: 11/03/2021  PCP: Ivy Lynn, NP  Admit date: 11/03/2021 Discharge date: 11/04/2021  Admitted From: home Disposition:  home  Recommendations for Outpatient Follow-up:  Follow up with Dr Donnetta Hutching as scheduled  Home Health: none Equipment/Devices: none  Discharge Condition: stable CODE STATUS: DNR Diet recommendation: heart healthy  HPI: Per admitting MD, [name]  is a 70 y.o. female, with history of CAD, PAD, HTN, HLD, CVA, PAF, and more presents to the ED with a  chief complaint of left sided numbness.  Patient reports that she walks with a walker because of her right side prosthetic.  She was ambulating normally when she had sudden onset of numbness and tingling in her fingers, which spread to her arm, then face, then mouth, and then she noticed in her leg.  She reports that the sensation was definitely numbness.  In her mouth it felt like she had had a Novocain shot.  She had a left-sided facial droop.  She did not notice any slurred speech, but daughter at bedside reports that she did have slurred speech.  She reports associated headache.  She describes the headache as her right temple feeling like blunt force trauma.  Headache is not as bad as it was when she presented to the ED about a month ago.  She had no changes in vision, no changes in hearing.  She does report that it felt like she was drooling on the left side of her mouth.  It felt difficult to swallow but she did not have any choking spells.  Other times she arrived to the ER her numbness was gone except for in her face.  She reports that her face numbness is gone now, but she just feels like something is still off in her left upper and lower extremity.  She does not describe it as weakness or numbness, just something off.  Patient reports nothing made her symptoms worse, nothing resolved them.  It is similar to the episode that she had in  October.  Patient has no other complaints at this time.  Patient does not smoke, does not drink, does not use illicit drugs.  She is not vaccinated for COVID.  Patient is DNR with plans to donate her body to [facility].  Hospital Course / Discharge diagnoses: Principal problem  TIA, history of CVA - patient was admitted to the hospital with transient left sided numbness which has resolved within a short period of time. She recently had a CVA in Oct 2022 and was found to have bilateral carotid artery stenosis. She was followed up by Dr Early as an outpatient with plans for a CTA and potential endarterectomy. While here, she underwent stroke workup with an MRI / MRA which did not show any acute findings / CVA / LVO. She is already on Eliquis and Aspirin, as well as lipid lowering medications. She underwent PT evaluation, no PT follow up. 2D echo was without significant changes when compared to the one in Oct 2022. Case was discussed with Dr Scot Dock with vascular surgery who recommended obtaining a CTA for pre-op preparation, and she will be followed up as an outpatient.  Active problems Right AKA - due to PAD, seeing Dr early Catalina Lunger - continue home regimen, on Eliquis HTN - resume home medications HLD - continue home medications LV hypertrophy, asymmetric - 2D echo shows severe asymmetric left ventricular hypertrophy of the basal and septal segments. Left ventricular  diastolic parameters are consistent with Grade I diastolic dysfunction (impaired relaxation). There is mitral chordal SAM with mild LVOT gradient as before. Outpatient cardiology evaluation  Sepsis ruled out   Discharge Instructions   Allergies as of 11/04/2021       Reactions   Atenolol Rash, Itching   Demerol  [meperidine Hcl] Itching   Demerol [meperidine] Other (See Comments), Itching   Unknown, can't remember  Unknown, can't remember    Gabapentin Anxiety, Other (See Comments)   SI and HI   Inderal [propranolol] Other  (See Comments), Itching   Doesn't remember    Prednisone Other (See Comments)   Muscle spasms   Statins Itching, Other (See Comments)   Simvastatin-caused severe itching Pravastatin-caused lesser tiching One type of statin? Caused stroke in 2006, and other symptoms as result   Warfarin And Related Other (See Comments)   Caused nose bleeds   Crestor [rosuvastatin] Itching, Rash   Docosahexaenoic Acid (dha)    Other reaction(s): Other (See Comments) nosebleeds   Lactose Intolerance (gi) Other (See Comments)   Bloating, gas   Lipitor [atorvastatin] Rash   Lovaza [omega-3-acid Ethyl Esters] Other (See Comments)   nosebleeds   Penicillins Hives, Rash, Other (See Comments)   Has patient had a PCN reaction causing immediate rash, facial/tongue/throat swelling, SOB or lightheadedness with hypotension: Yes Has patient had a PCN reaction causing severe rash involving mucus membranes or skin necrosis: No Has patient had a PCN reaction that required hospitalization: No Has patient had a PCN reaction occurring within the last 10 years: No If all of the above answers are "NO", then may proceed with Cephalosporin use. Questionable high fever    Pravastatin Itching, Rash   Simvastatin Itching, Rash, Other (See Comments)   Trilipix [choline Fenofibrate] Other (See Comments)   Doesn't remember    Warfarin Other (See Comments)   nosebleeds   Hydralazine Swelling   Effexor [venlafaxine Hcl] Other (See Comments)   Doesn't remember    Nexium [esomeprazole Magnesium] Rash   Percocet [oxycodone-acetaminophen] Other (See Comments)   Doesn't remember    Plavix [clopidogrel Bisulfate] Rash        Medication List     TAKE these medications    acetaminophen 500 MG tablet Commonly known as: TYLENOL Take 2 tablets (1,000 mg total) by mouth every 6 (six) hours.   ARTIFICIAL TEAR OP Place 1 drop into both eyes as needed (for dry eyes).   aspirin EC 325 MG tablet Take 325 mg by mouth daily.    calcium-vitamin D 500-200 MG-UNIT tablet Commonly known as: OSCAL WITH D Take 1 tablet by mouth daily with breakfast.   chlorthalidone 25 MG tablet Commonly known as: HYGROTON Take 1 tablet (25 mg total) by mouth daily.   diclofenac Sodium 1 % Gel Commonly known as: VOLTAREN Apply 2 g topically 4 (four) times daily.   diltiazem 240 MG 24 hr capsule Commonly known as: CARDIZEM CD TAKE 1 CAPSULE EVERY MORNING What changed: when to take this   diphenhydrAMINE 25 MG tablet Commonly known as: BENADRYL Take 25 mg by mouth every 6 (six) hours as needed.   Eliquis 5 MG Tabs tablet Generic drug: apixaban TAKE ONE TABLET TWICE DAILY   escitalopram 20 MG tablet Commonly known as: LEXAPRO Take 1 tablet (20 mg total) by mouth daily.   ezetimibe 10 MG tablet Commonly known as: ZETIA Take 1 tablet (10 mg total) by mouth daily.   fenofibrate 145 MG tablet Commonly known as: TRICOR Take 1  tablet (145 mg total) by mouth daily. (NEEDS TO BE SEEN BEFORE NEXT REFILL)   flecainide 50 MG tablet Commonly known as: TAMBOCOR TAKE 1 TABLET 2 TIMES A DAY   hydroxyurea 500 MG capsule Commonly known as: HYDREA Take 1 capsule (500 mg total) by mouth daily. Take one capsule daily except Sundays. May take with food to minimize GI side effects.   omeprazole 20 MG capsule Commonly known as: PRILOSEC TAKE 1 CAPSULE 2 TIMES A DAY BEFORE A MEAL What changed:  how much to take how to take this when to take this additional instructions   traMADol 50 MG tablet Commonly known as: ULTRAM Take 50 mg by mouth daily as needed for moderate pain.   valsartan 160 MG tablet Commonly known as: DIOVAN TAKE ONE TABLET ONCE DAILY         Consultations: None  Procedures/Studies:  CT HEAD WO CONTRAST  Result Date: 10/05/2021 CLINICAL DATA:  TIA EXAM: CT HEAD WITHOUT CONTRAST TECHNIQUE: Contiguous axial images were obtained from the base of the skull through the vertex without intravenous  contrast. COMPARISON:  09/10/2021 FINDINGS: Brain: No acute intracranial abnormality. Specifically, no hemorrhage, hydrocephalus, mass lesion, acute infarction, or significant intracranial injury. Vascular: No hyperdense vessel or unexpected calcification. Skull: No acute calvarial abnormality. Sinuses/Orbits: No acute findings Other: None IMPRESSION: No acute intracranial abnormality. Electronically Signed   By: Rolm Baptise M.D.   On: 10/05/2021 21:35   MR ANGIO HEAD WO CONTRAST  Result Date: 11/04/2021 CLINICAL DATA:  Bilateral weakness for 2 weeks, stroke suspected EXAM: MRI HEAD WITH CONTRAST MRA HEAD WITH CONTRAST MRI NECK WITHOUT AND WITH CONTRAST TECHNIQUE: Multiplanar, multiecho pulse sequences of the brain and surrounding structures were obtained with intravenous contrast. Angiographic images of the head were obtained using MRA technique with contrast. Multiplanar, multiecho pulse sequences of the neck and surrounding structures were obtained without and with intravenous contrast. COMPARISON:  Noncontrast CT head dated 1 day prior, MRA head 10/06/2021, carotid Doppler 10/08/2021 FINDINGS: MRI HEAD FINDINGS Brain: There is no evidence of acute intracranial hemorrhage, extra-axial fluid collection, or acute infarct. There is mild parenchymal volume loss. The ventricles are not enlarged. There is scattered FLAIR signal abnormality throughout the subcortical and periventricular white matter likely reflecting sequela of chronic white matter microangiopathy. There is no suspicious parenchymal signal abnormality. There is no mass lesion.  There is no midline shift. Vascular: Normal flow voids. Skull and upper cervical spine: Normal marrow signal. Sinuses/Orbits: The imaged paranasal sinuses are clear. Bilateral lens implants are in place. The globes and orbits are otherwise unremarkable. Other: None. MRA HEAD FINDINGS Anterior circulation: The bilateral intracranial ICAs are patent with mild multifocal  irregularity likely reflecting atherosclerotic disease. There is no high-grade stenosis or occlusion. The bilateral MCAs are patent. The left A1 segment is diminutive, unchanged. There is focal moderate stenosis of the left A2 segment, unchanged (10-150). Focal stenosis of the right A2 segment is also not significantly changed (10-148). There is no evidence of occlusion. There is no aneurysm. Posterior circulation: The proximal left V4 segment again demonstrates no flow related enhancement. There is flow related enhancement of the distal left V4 segment proximal to the confluence. There is mild irregularity of the right V4 segment without high-grade stenosis or occlusion. The basilar artery is patent with mild multifocal irregularity and narrowing. There is unchanged focal severe stenosis at the left P1/P2 junction (10-135). There is also unchanged no set the right P1/P2 junction. The right PCA is again supplied by  a prominent posterior communicating artery. There is moderate stenosis of the right P2 segment (10-126). The PCAs are otherwise patent, without evidence of occlusion. There is no aneurysm. Anatomic variants: As above. MRA NECK FINDINGS Aortic arch: The imaged portions of the aortic arch are unremarkable. There is focal high-grade stenosis of the left subclavian artery (35-62), with a thin trickle of contrast apparently traversing the stenosis. The right brachiocephalic and subclavian arteries are unremarkable. Right carotid system: There is irregularity of the proximal right internal carotid artery resulting in approximately 30-40% stenosis (though this may be an underestimate given findings on recent carotid ultrasound). There is no evidence of dissection or aneurysm. Left carotid system: There is irregularity and narrowing of the left proximal internal carotid artery resulting in approximately 70% stenosis. There is no evidence of dissection or aneurysm. Vertebral arteries: The left vertebral artery is  occluded from its origin through the mid to distal V2 segment on the postcontrast images. There is no flow related enhancement within the imaged portions of the vertebral artery on the time-of-flight sequence. There is postcontrast enhancement of the distal V2, V3, and V4 segments with focal moderate stenosis of the V4 segment. The right vertebral artery is patent with antegrade flow. IMPRESSION: 1. No acute intracranial pathology. 2. Mild chronic white matter microangiopathy. 3. Multifocal stenosis of the intracranial vasculature as above, overall not significantly changed compared to the MRA from 10/06/2021. Specifically, focal stenosis of the bilateral A2 segments and bilateral PCAs are again seen. There is no evidence of large vessel occlusion. 4. Focal severe stenosis of the left subclavian artery in the region of the origin of the left vertebral artery. There is no flow related enhancement within the vertebral artery, and there is no postcontrast enhancement within the proximal vertebral artery from the origin through the mid V2 segment, with reconstitution of postcontrast enhancement in the distal vertebral artery. Given lack of flow related enhancement and focal stenosis of the left subclavian artery, findings could reflect subclavian steal. 5. Approximately 30-40% stenosis of the right proximal internal carotid artery and 70% stenosis of the proximal left internal carotid artery. Note that these findings may be underestimated given findings on recent carotid ultrasound. Electronically Signed   By: Valetta Mole M.D.   On: 11/04/2021 11:14   MR ANGIO HEAD WO CONTRAST  Result Date: 10/06/2021 CLINICAL DATA:  TIA, altered mental status, weakness EXAM: MRI HEAD WITHOUT CONTRAST MRA HEAD WITHOUT CONTRAST TECHNIQUE: Multiplanar, multi-echo pulse sequences of the brain and surrounding structures were acquired without intravenous contrast. Angiographic images of the Circle of Willis were acquired using MRA  technique without intravenous contrast. COMPARISON:  Noncontrast CT head obtained 1 day prior FINDINGS: MRI HEAD FINDINGS Brain: There is a tiny focus of faint diffusion restriction in the right centrum semiovale which could reflect a tiny acute to subacute infarct. There is no other evidence of acute infarct. There is no acute intracranial hemorrhage or extra-axial fluid collection. There is unchanged global parenchymal volume loss. Confluent FLAIR signal abnormality throughout the subcortical and periventricular white matter likely reflects sequela of moderate chronic white matter microangiopathy. There are small remote lacunar infarcts in the right caudate head and basal ganglia. There is no mass lesion.  There is no midline shift. Vascular: The major flow voids are present. The vasculature is better assessed below. Skull and upper cervical spine: Normal marrow signal. Sinuses/Orbits: The imaged paranasal sinuses are clear. Bilateral lens implants are in place. The globes and orbits are otherwise unremarkable. Other: None.  MRA HEAD FINDINGS Anterior circulation: There is mild irregularity of the intracranial ICAs likely reflecting atherosclerotic disease. There is no high-grade stenosis or occlusion. The bilateral MCAs are patent. The left A1 segment is diminutive, likely a normal variant. There is focal moderate stenosis of the left A2 segment (10-148). The bilateral ACAs are otherwise patent. There is multifocal irregularity of the right A2 segment with up to severe stenosis (941-740). There is no aneurysm. Posterior circulation: There is minimal flow related enhancement in the proximal left V4 segment. The distal left V4 segment is patent. The right V4 segment is patent with mild multifocal irregularity and narrowing. The basilar artery is patent with mild multifocal irregularity and narrowing. There is focal stenosis at the right P1/P2 junction, though the right PCA is supplied by a prominent right posterior  communicating artery. The bilateral PCAs are otherwise patent with mild multifocal irregularity and narrowing throughout. There is no aneurysm. Anatomic variants: As above. IMPRESSION: 1. Tiny acute to subacute infarct in the right centrum semiovale. No other acute infarct or other acute intracranial pathology. 2. Mild global parenchymal volume loss and moderate chronic white matter microangiopathy. 3. Multifocal irregularity and narrowing of the right A2 segment with up to severe stenosis, and moderate focal stenosis of the left A2 segment. 4. Diminutive flow related enhancement in the proximal left V4 segment. This may reflect high-grade stenosis or occlusion due to atherosclerotic disease with possible retrograde flow via the right V4 segment. CTA head could be obtained for further evaluation. 5. Focal stenosis at the right P1/P2 junction, though the right PCA is primarily supplied by a prominent posterior communicating artery. 6. Otherwise, mild irregularity of the remainder of the intracranial vasculature as detailed above. Electronically Signed   By: Valetta Mole M.D.   On: 10/06/2021 10:17   MR ANGIO NECK W WO CONTRAST  Result Date: 11/04/2021 CLINICAL DATA:  Bilateral weakness for 2 weeks, stroke suspected EXAM: MRI HEAD WITH CONTRAST MRA HEAD WITH CONTRAST MRI NECK WITHOUT AND WITH CONTRAST TECHNIQUE: Multiplanar, multiecho pulse sequences of the brain and surrounding structures were obtained with intravenous contrast. Angiographic images of the head were obtained using MRA technique with contrast. Multiplanar, multiecho pulse sequences of the neck and surrounding structures were obtained without and with intravenous contrast. COMPARISON:  Noncontrast CT head dated 1 day prior, MRA head 10/06/2021, carotid Doppler 10/08/2021 FINDINGS: MRI HEAD FINDINGS Brain: There is no evidence of acute intracranial hemorrhage, extra-axial fluid collection, or acute infarct. There is mild parenchymal volume loss. The  ventricles are not enlarged. There is scattered FLAIR signal abnormality throughout the subcortical and periventricular white matter likely reflecting sequela of chronic white matter microangiopathy. There is no suspicious parenchymal signal abnormality. There is no mass lesion.  There is no midline shift. Vascular: Normal flow voids. Skull and upper cervical spine: Normal marrow signal. Sinuses/Orbits: The imaged paranasal sinuses are clear. Bilateral lens implants are in place. The globes and orbits are otherwise unremarkable. Other: None. MRA HEAD FINDINGS Anterior circulation: The bilateral intracranial ICAs are patent with mild multifocal irregularity likely reflecting atherosclerotic disease. There is no high-grade stenosis or occlusion. The bilateral MCAs are patent. The left A1 segment is diminutive, unchanged. There is focal moderate stenosis of the left A2 segment, unchanged (10-150). Focal stenosis of the right A2 segment is also not significantly changed (10-148). There is no evidence of occlusion. There is no aneurysm. Posterior circulation: The proximal left V4 segment again demonstrates no flow related enhancement. There is flow related enhancement  of the distal left V4 segment proximal to the confluence. There is mild irregularity of the right V4 segment without high-grade stenosis or occlusion. The basilar artery is patent with mild multifocal irregularity and narrowing. There is unchanged focal severe stenosis at the left P1/P2 junction (10-135). There is also unchanged no set the right P1/P2 junction. The right PCA is again supplied by a prominent posterior communicating artery. There is moderate stenosis of the right P2 segment (10-126). The PCAs are otherwise patent, without evidence of occlusion. There is no aneurysm. Anatomic variants: As above. MRA NECK FINDINGS Aortic arch: The imaged portions of the aortic arch are unremarkable. There is focal high-grade stenosis of the left subclavian  artery (35-62), with a thin trickle of contrast apparently traversing the stenosis. The right brachiocephalic and subclavian arteries are unremarkable. Right carotid system: There is irregularity of the proximal right internal carotid artery resulting in approximately 30-40% stenosis (though this may be an underestimate given findings on recent carotid ultrasound). There is no evidence of dissection or aneurysm. Left carotid system: There is irregularity and narrowing of the left proximal internal carotid artery resulting in approximately 70% stenosis. There is no evidence of dissection or aneurysm. Vertebral arteries: The left vertebral artery is occluded from its origin through the mid to distal V2 segment on the postcontrast images. There is no flow related enhancement within the imaged portions of the vertebral artery on the time-of-flight sequence. There is postcontrast enhancement of the distal V2, V3, and V4 segments with focal moderate stenosis of the V4 segment. The right vertebral artery is patent with antegrade flow. IMPRESSION: 1. No acute intracranial pathology. 2. Mild chronic white matter microangiopathy. 3. Multifocal stenosis of the intracranial vasculature as above, overall not significantly changed compared to the MRA from 10/06/2021. Specifically, focal stenosis of the bilateral A2 segments and bilateral PCAs are again seen. There is no evidence of large vessel occlusion. 4. Focal severe stenosis of the left subclavian artery in the region of the origin of the left vertebral artery. There is no flow related enhancement within the vertebral artery, and there is no postcontrast enhancement within the proximal vertebral artery from the origin through the mid V2 segment, with reconstitution of postcontrast enhancement in the distal vertebral artery. Given lack of flow related enhancement and focal stenosis of the left subclavian artery, findings could reflect subclavian steal. 5. Approximately 30-40%  stenosis of the right proximal internal carotid artery and 70% stenosis of the proximal left internal carotid artery. Note that these findings may be underestimated given findings on recent carotid ultrasound. Electronically Signed   By: Valetta Mole M.D.   On: 11/04/2021 11:14   MR BRAIN WO CONTRAST  Result Date: 11/04/2021 CLINICAL DATA:  Bilateral weakness for 2 weeks, stroke suspected EXAM: MRI HEAD WITH CONTRAST MRA HEAD WITH CONTRAST MRI NECK WITHOUT AND WITH CONTRAST TECHNIQUE: Multiplanar, multiecho pulse sequences of the brain and surrounding structures were obtained with intravenous contrast. Angiographic images of the head were obtained using MRA technique with contrast. Multiplanar, multiecho pulse sequences of the neck and surrounding structures were obtained without and with intravenous contrast. COMPARISON:  Noncontrast CT head dated 1 day prior, MRA head 10/06/2021, carotid Doppler 10/08/2021 FINDINGS: MRI HEAD FINDINGS Brain: There is no evidence of acute intracranial hemorrhage, extra-axial fluid collection, or acute infarct. There is mild parenchymal volume loss. The ventricles are not enlarged. There is scattered FLAIR signal abnormality throughout the subcortical and periventricular white matter likely reflecting sequela of chronic white matter  microangiopathy. There is no suspicious parenchymal signal abnormality. There is no mass lesion.  There is no midline shift. Vascular: Normal flow voids. Skull and upper cervical spine: Normal marrow signal. Sinuses/Orbits: The imaged paranasal sinuses are clear. Bilateral lens implants are in place. The globes and orbits are otherwise unremarkable. Other: None. MRA HEAD FINDINGS Anterior circulation: The bilateral intracranial ICAs are patent with mild multifocal irregularity likely reflecting atherosclerotic disease. There is no high-grade stenosis or occlusion. The bilateral MCAs are patent. The left A1 segment is diminutive, unchanged. There is  focal moderate stenosis of the left A2 segment, unchanged (10-150). Focal stenosis of the right A2 segment is also not significantly changed (10-148). There is no evidence of occlusion. There is no aneurysm. Posterior circulation: The proximal left V4 segment again demonstrates no flow related enhancement. There is flow related enhancement of the distal left V4 segment proximal to the confluence. There is mild irregularity of the right V4 segment without high-grade stenosis or occlusion. The basilar artery is patent with mild multifocal irregularity and narrowing. There is unchanged focal severe stenosis at the left P1/P2 junction (10-135). There is also unchanged no set the right P1/P2 junction. The right PCA is again supplied by a prominent posterior communicating artery. There is moderate stenosis of the right P2 segment (10-126). The PCAs are otherwise patent, without evidence of occlusion. There is no aneurysm. Anatomic variants: As above. MRA NECK FINDINGS Aortic arch: The imaged portions of the aortic arch are unremarkable. There is focal high-grade stenosis of the left subclavian artery (35-62), with a thin trickle of contrast apparently traversing the stenosis. The right brachiocephalic and subclavian arteries are unremarkable. Right carotid system: There is irregularity of the proximal right internal carotid artery resulting in approximately 30-40% stenosis (though this may be an underestimate given findings on recent carotid ultrasound). There is no evidence of dissection or aneurysm. Left carotid system: There is irregularity and narrowing of the left proximal internal carotid artery resulting in approximately 70% stenosis. There is no evidence of dissection or aneurysm. Vertebral arteries: The left vertebral artery is occluded from its origin through the mid to distal V2 segment on the postcontrast images. There is no flow related enhancement within the imaged portions of the vertebral artery on the  time-of-flight sequence. There is postcontrast enhancement of the distal V2, V3, and V4 segments with focal moderate stenosis of the V4 segment. The right vertebral artery is patent with antegrade flow. IMPRESSION: 1. No acute intracranial pathology. 2. Mild chronic white matter microangiopathy. 3. Multifocal stenosis of the intracranial vasculature as above, overall not significantly changed compared to the MRA from 10/06/2021. Specifically, focal stenosis of the bilateral A2 segments and bilateral PCAs are again seen. There is no evidence of large vessel occlusion. 4. Focal severe stenosis of the left subclavian artery in the region of the origin of the left vertebral artery. There is no flow related enhancement within the vertebral artery, and there is no postcontrast enhancement within the proximal vertebral artery from the origin through the mid V2 segment, with reconstitution of postcontrast enhancement in the distal vertebral artery. Given lack of flow related enhancement and focal stenosis of the left subclavian artery, findings could reflect subclavian steal. 5. Approximately 30-40% stenosis of the right proximal internal carotid artery and 70% stenosis of the proximal left internal carotid artery. Note that these findings may be underestimated given findings on recent carotid ultrasound. Electronically Signed   By: Valetta Mole M.D.   On: 11/04/2021 11:14  MR BRAIN WO CONTRAST  Result Date: 10/06/2021 CLINICAL DATA:  TIA, altered mental status, weakness EXAM: MRI HEAD WITHOUT CONTRAST MRA HEAD WITHOUT CONTRAST TECHNIQUE: Multiplanar, multi-echo pulse sequences of the brain and surrounding structures were acquired without intravenous contrast. Angiographic images of the Circle of Willis were acquired using MRA technique without intravenous contrast. COMPARISON:  Noncontrast CT head obtained 1 day prior FINDINGS: MRI HEAD FINDINGS Brain: There is a tiny focus of faint diffusion restriction in the right  centrum semiovale which could reflect a tiny acute to subacute infarct. There is no other evidence of acute infarct. There is no acute intracranial hemorrhage or extra-axial fluid collection. There is unchanged global parenchymal volume loss. Confluent FLAIR signal abnormality throughout the subcortical and periventricular white matter likely reflects sequela of moderate chronic white matter microangiopathy. There are small remote lacunar infarcts in the right caudate head and basal ganglia. There is no mass lesion.  There is no midline shift. Vascular: The major flow voids are present. The vasculature is better assessed below. Skull and upper cervical spine: Normal marrow signal. Sinuses/Orbits: The imaged paranasal sinuses are clear. Bilateral lens implants are in place. The globes and orbits are otherwise unremarkable. Other: None. MRA HEAD FINDINGS Anterior circulation: There is mild irregularity of the intracranial ICAs likely reflecting atherosclerotic disease. There is no high-grade stenosis or occlusion. The bilateral MCAs are patent. The left A1 segment is diminutive, likely a normal variant. There is focal moderate stenosis of the left A2 segment (10-148). The bilateral ACAs are otherwise patent. There is multifocal irregularity of the right A2 segment with up to severe stenosis (384-665). There is no aneurysm. Posterior circulation: There is minimal flow related enhancement in the proximal left V4 segment. The distal left V4 segment is patent. The right V4 segment is patent with mild multifocal irregularity and narrowing. The basilar artery is patent with mild multifocal irregularity and narrowing. There is focal stenosis at the right P1/P2 junction, though the right PCA is supplied by a prominent right posterior communicating artery. The bilateral PCAs are otherwise patent with mild multifocal irregularity and narrowing throughout. There is no aneurysm. Anatomic variants: As above. IMPRESSION: 1. Tiny  acute to subacute infarct in the right centrum semiovale. No other acute infarct or other acute intracranial pathology. 2. Mild global parenchymal volume loss and moderate chronic white matter microangiopathy. 3. Multifocal irregularity and narrowing of the right A2 segment with up to severe stenosis, and moderate focal stenosis of the left A2 segment. 4. Diminutive flow related enhancement in the proximal left V4 segment. This may reflect high-grade stenosis or occlusion due to atherosclerotic disease with possible retrograde flow via the right V4 segment. CTA head could be obtained for further evaluation. 5. Focal stenosis at the right P1/P2 junction, though the right PCA is primarily supplied by a prominent posterior communicating artery. 6. Otherwise, mild irregularity of the remainder of the intracranial vasculature as detailed above. Electronically Signed   By: Valetta Mole M.D.   On: 10/06/2021 10:17   US Carotid Bilateral  Result Date: 10/08/2021 CLINICAL DATA:  Acute ischemic stroke. EXAM: BILATERAL CAROTID DUPLEX ULTRASOUND TECHNIQUE: Pearline Cables scale imaging, color Doppler and duplex ultrasound were performed of bilateral carotid and vertebral arteries in the neck. COMPARISON:  None. FINDINGS: Criteria: Quantification of carotid stenosis is based on velocity parameters that correlate the residual internal carotid diameter with NASCET-based stenosis levels, using the diameter of the distal internal carotid lumen as the denominator for stenosis measurement. The following velocity measurements were  obtained: RIGHT ICA: 194/28 cm/sec CCA: 40/08 cm/sec SYSTOLIC ICA/CCA RATIO:  2.3 ECA: 105 cm/sec LEFT ICA: 308/49 cm/sec CCA: 676/19 cm/sec SYSTOLIC ICA/CCA RATIO:  3.1 ECA: 458 cm/sec RIGHT CAROTID ARTERY: Echogenic plaque in the right common carotid artery. Echogenic plaque at the right carotid bulb. External carotid artery is patent with normal waveform. Irregular echogenic plaque in the proximal internal  carotid artery. Elevated peak systolic velocity in the mid internal carotid artery measuring 194 cm/sec. RIGHT VERTEBRAL ARTERY: Antegrade flow and normal waveform in the right vertebral artery. LEFT CAROTID ARTERY: Echogenic plaque in the left common carotid artery. Elevated peak systolic velocity in the left external carotid artery measuring 458 cm/sec. Echogenic plaque in the left internal carotid artery. Markedly elevated peak systolic velocity in the mid internal carotid artery with a peak systolic velocity of 509 cm/sec. LEFT VERTEBRAL ARTERY: Antegrade flow and normal waveform in the left vertebral artery. IMPRESSION: 1. Estimated degree of stenosis in left internal carotid artery is greater than 70%. In addition, evidence for stenosis in the left external carotid artery. 2. Estimated degree of stenosis in the right internal carotid artery is 50-69%. 3. Bilateral vertebral arteries are patent with antegrade flow. Electronically Signed   By: Markus Daft M.D.   On: 10/08/2021 11:41   ECHOCARDIOGRAM COMPLETE  Result Date: 11/04/2021    ECHOCARDIOGRAM REPORT   Patient Name:   [name] Date of Exam: 11/04/2021 Medical Rec #:  [id number]      Height:       64.5 in Accession #:    [id number]     Weight:       137.3 lb Date of Birth:  09-Oct-1951       BSA:          1.676 m Patient Age:    88 years       BP:           138/73 mmHg Patient Gender: F              HR:           64 bpm. Exam Location:  [facility] Procedure: 2D Echo, Cardiac Doppler and Color Doppler Indications:    TIA  History:        Patient has prior history of Echocardiogram examinations, most                 recent 10/06/2021. CAD and Previous Myocardial Infarction, PAD                 and TIA, Arrythmias:Atrial Fibrillation; Risk                 Factors:Hypertension and Dyslipidemia.  Sonographer:    Wenda Low Referring Phys: [id number] ASIA B [place]  1. Left ventricular ejection fraction, by estimation, is 70 to 75%.  The left ventricle has hyperdynamic function. The left ventricle has no regional wall motion abnormalities. There is severe asymmetric left ventricular hypertrophy of the basal and septal segments. Left ventricular diastolic parameters are consistent with Grade I diastolic dysfunction (impaired relaxation). There is mitral chordal SAM with mild LVOT gradient as before.  2. Right ventricular systolic function is normal. The right ventricular size is normal. Tricuspid regurgitation signal is inadequate for assessing PA pressure.  3. Left atrial size was mildly dilated.  4. The mitral valve is abnormal. Trivial mitral valve regurgitation. Moderate mitral annular calcification.  5. The aortic valve is tricuspid. Aortic valve regurgitation is not visualized. Aortic valve  sclerosis is present, with no evidence of aortic valve stenosis. Aortic valve mean gradient measures 6.0 mmHg.  6. The inferior vena cava is normal in size with greater than 50% respiratory variability, suggesting right atrial pressure of 3 mmHg. Comparison(s): No significant change from prior study. Prior images reviewed side by side. FINDINGS  Left Ventricle: Left ventricular ejection fraction, by estimation, is 70 to 75%. The left ventricle has hyperdynamic function. The left ventricle has no regional wall motion abnormalities. The left ventricular internal cavity size was normal in size. There is severe asymmetric left ventricular hypertrophy of the basal and septal segments. Left ventricular diastolic parameters are consistent with Grade I diastolic dysfunction (impaired relaxation). Right Ventricle: The right ventricular size is normal. No increase in right ventricular wall thickness. Right ventricular systolic function is normal. Tricuspid regurgitation signal is inadequate for assessing PA pressure. Left Atrium: Left atrial size was mildly dilated. Right Atrium: Right atrial size was normal in size. Pericardium: There is no evidence of  pericardial effusion. Mitral Valve: The mitral valve is abnormal. Moderate mitral annular calcification. Trivial mitral valve regurgitation. MV peak gradient, 7.5 mmHg. The mean mitral valve gradient is 2.0 mmHg. Tricuspid Valve: The tricuspid valve is grossly normal. Tricuspid valve regurgitation is trivial. Aortic Valve: The aortic valve is tricuspid. There is mild to moderate aortic valve annular calcification. Aortic valve regurgitation is not visualized. Aortic valve sclerosis is present, with no evidence of aortic valve stenosis. Aortic valve mean gradient measures 6.0 mmHg. Aortic valve peak gradient measures 10.9 mmHg. Aortic valve area, by VTI measures 2.43 cm. Pulmonic Valve: The pulmonic valve was grossly normal. Pulmonic valve regurgitation is trivial. Aorta: The aortic root is normal in size and structure. Venous: The inferior vena cava is normal in size with greater than 50% respiratory variability, suggesting right atrial pressure of 3 mmHg. IAS/Shunts: No atrial level shunt detected by color flow Doppler.  LEFT VENTRICLE PLAX 2D LVIDd:         3.90 cm     Diastology LVIDs:         2.60 cm     LV e' medial:    5.33 cm/s LV PW:         1.30 cm     LV E/e' medial:  16.6 LV IVS:        1.80 cm     LV e' lateral:   6.53 cm/s LVOT diam:     2.00 cm     LV E/e' lateral: 13.6 LV SV:         94 LV SV Index:   56 LVOT Area:     3.14 cm  LV Volumes (MOD) LV vol d, MOD A2C: 55.5 ml LV vol d, MOD A4C: 41.2 ml LV vol s, MOD A2C: 16.7 ml LV vol s, MOD A4C: 12.4 ml LV SV MOD A2C:     38.8 ml LV SV MOD A4C:     41.2 ml LV SV MOD BP:      34.2 ml RIGHT VENTRICLE RV Basal diam:  2.65 cm RV Mid diam:    3.10 cm RV S prime:     12.00 cm/s TAPSE (M-mode): 2.5 cm LEFT ATRIUM             Index        RIGHT ATRIUM           Index LA diam:        3.70 cm 2.21 cm/m   RA Area:  15.70 cm LA Vol (A2C):   68.7 ml 40.98 ml/m  RA Volume:   38.00 ml  22.67 ml/m LA Vol (A4C):   49.7 ml 29.65 ml/m LA Biplane Vol: 58.5 ml  34.89 ml/m  AORTIC VALVE                     PULMONIC VALVE AV Area (Vmax):    2.63 cm      PV Vmax:       1.12 m/s AV Area (Vmean):   2.23 cm      PV Peak grad:  5.0 mmHg AV Area (VTI):     2.43 cm AV Vmax:           165.00 cm/s AV Vmean:          109.000 cm/s AV VTI:            0.387 m AV Peak Grad:      10.9 mmHg AV Mean Grad:      6.0 mmHg LVOT Vmax:         138.00 cm/s LVOT Vmean:        77.300 cm/s LVOT VTI:          0.299 m LVOT/AV VTI ratio: 0.77  AORTA Ao Root diam: 2.40 cm MITRAL VALVE MV Area (PHT): 3.19 cm     SHUNTS MV Area VTI:   2.63 cm     Systemic VTI:  0.30 m MV Peak grad:  7.5 mmHg     Systemic Diam: 2.00 cm MV Mean grad:  2.0 mmHg MV Vmax:       1.37 m/s MV Vmean:      63.8 cm/s MV Decel Time: 238 msec MV E velocity: 88.60 cm/s MV A velocity: 132.00 cm/s MV E/A ratio:  0.67 Rozann Lesches MD Electronically signed by Rozann Lesches MD Signature Date/Time: 11/04/2021/1:55:40 PM    Final    ECHOCARDIOGRAM COMPLETE  Result Date: 10/06/2021    ECHOCARDIOGRAM REPORT   Patient Name:   [name] Date of Exam: 10/06/2021 Medical Rec #:  [id number]      Height:       65.5 in Accession #:    [id number]     Weight:       136.0 lb Date of Birth:  1951/04/29       BSA:          1.689 m Patient Age:    36 years       BP:           104/86 mmHg Patient Gender: F              HR:           78 bpm. Exam Location:  [facility] Procedure: 2D Echo, Cardiac Doppler and Color Doppler Indications:    TIA  History:        Patient has prior history of Echocardiogram examinations, most                 recent 05/18/2013. CAD and Previous Myocardial Infarction, PAD                 and TIA, Arrythmias:Atrial Fibrillation; Risk                 Factors:Hypertension and Dyslipidemia.  Sonographer:    Wenda Low Referring Phys: [id number] ASIA B [place]  1. There is severe asymmetric hypertrophy of the basal septum (up to 18 mm). There is chordal  SAM but no SAM of the mitral valve. There is a mid  cavitary gradient up to 15-17 mmHG. No signs of obstruction. Findings possibly represent hypertrophic cardiomyopathy. Would recommend a non-urgent cardiac MRI for clarification. Left ventricular ejection fraction, by estimation, is 70 to 75%. The left ventricle has hyperdynamic function. The left ventricle has no regional wall motion abnormalities. There  is severe asymmetric left ventricular hypertrophy of the basal-septal segment. Left ventricular diastolic parameters are consistent with Grade I diastolic dysfunction (impaired relaxation).  2. Right ventricular systolic function is normal. The right ventricular size is normal. Tricuspid regurgitation signal is inadequate for assessing PA pressure.  3. The mitral valve is degenerative. Trivial mitral valve regurgitation. No evidence of mitral stenosis.  4. The aortic valve is tricuspid. There is mild calcification of the aortic valve. Aortic valve regurgitation is not visualized. Mild aortic valve sclerosis is present, with no evidence of aortic valve stenosis.  5. The inferior vena cava is normal in size with greater than 50% respiratory variability, suggesting right atrial pressure of 3 mmHg. Conclusion(s)/Recommendation(s): No intracardiac source of embolism detected on this transthoracic study. A transesophageal echocardiogram is recommended to exclude cardiac source of embolism if clinically indicated. FINDINGS  Left Ventricle: There is severe asymmetric hypertrophy of the basal septum (up to 18 mm). There is chordal SAM but no SAM of the mitral valve. There is a mid cavitary gradient up to 15-17 mmHG. No signs of obstruction. Findings possibly represent hypertrophic cardiomyopathy. Would recommend a non-urgent cardiac MRI for clarification. Left ventricular ejection fraction, by estimation, is 70 to 75%. The left ventricle has hyperdynamic function. The left ventricle has no regional wall motion abnormalities. The left ventricular internal cavity size was  small. There is severe asymmetric left ventricular hypertrophy of the basal-septal segment. Left ventricular diastolic parameters are consistent with Grade I diastolic dysfunction (impaired relaxation). Right Ventricle: The right ventricular size is normal. No increase in right ventricular wall thickness. Right ventricular systolic function is normal. Tricuspid regurgitation signal is inadequate for assessing PA pressure. Left Atrium: Left atrial size was normal in size. Right Atrium: Right atrial size was normal in size. Pericardium: There is no evidence of pericardial effusion. Presence of pericardial fat pad. Mitral Valve: The mitral valve is degenerative in appearance. Trivial mitral valve regurgitation. No evidence of mitral valve stenosis. MV peak gradient, 14.1 mmHg. The mean mitral valve gradient is 6.0 mmHg. Tricuspid Valve: The tricuspid valve is grossly normal. Tricuspid valve regurgitation is trivial. No evidence of tricuspid stenosis. Aortic Valve: The aortic valve is tricuspid. There is mild calcification of the aortic valve. Aortic valve regurgitation is not visualized. Mild aortic valve sclerosis is present, with no evidence of aortic valve stenosis. Aortic valve mean gradient measures 6.5 mmHg. Aortic valve peak gradient measures 13.2 mmHg. Aortic valve area, by VTI measures 3.12 cm. Pulmonic Valve: The pulmonic valve was grossly normal. Pulmonic valve regurgitation is trivial. No evidence of pulmonic stenosis. Aorta: The aortic root is normal in size and structure. Venous: The inferior vena cava is normal in size with greater than 50% respiratory variability, suggesting right atrial pressure of 3 mmHg. IAS/Shunts: The atrial septum is grossly normal.  LEFT VENTRICLE PLAX 2D LVIDd:         3.50 cm   Diastology LVIDs:         2.30 cm   LV e' medial:    5.00 cm/s LV PW:         1.30 cm   LV E/e'  medial:  18.2 LV IVS:        1.80 cm   LV e' lateral:   7.07 cm/s LVOT diam:     2.00 cm   LV E/e'  lateral: 12.8 LV SV:         99 LV SV Index:   58 LVOT Area:     3.14 cm  RIGHT VENTRICLE RV Basal diam:  2.80 cm RV Mid diam:    2.90 cm RV S prime:     16.80 cm/s TAPSE (M-mode): 2.2 cm LEFT ATRIUM             Index        RIGHT ATRIUM           Index LA diam:        3.70 cm 2.19 cm/m   RA Area:     14.00 cm LA Vol (A2C):   45.1 ml 26.71 ml/m  RA Volume:   30.20 ml  17.88 ml/m LA Vol (A4C):   46.1 ml 27.30 ml/m LA Biplane Vol: 45.6 ml 27.00 ml/m  AORTIC VALVE                     PULMONIC VALVE AV Area (Vmax):    3.09 cm      PV Vmax:       1.02 m/s AV Area (Vmean):   3.40 cm      PV Peak grad:  4.2 mmHg AV Area (VTI):     3.12 cm AV Vmax:           182.00 cm/s AV Vmean:          110.000 cm/s AV VTI:            0.316 m AV Peak Grad:      13.2 mmHg AV Mean Grad:      6.5 mmHg LVOT Vmax:         179.00 cm/s LVOT Vmean:        119.000 cm/s LVOT VTI:          0.314 m LVOT/AV VTI ratio: 0.99  AORTA Ao Root diam: 2.80 cm MITRAL VALVE MV Area (PHT): 3.08 cm     SHUNTS MV Area VTI:   2.49 cm     Systemic VTI:  0.31 m MV Peak grad:  14.1 mmHg    Systemic Diam: 2.00 cm MV Mean grad:  6.0 mmHg MV Vmax:       1.88 m/s MV Vmean:      108.0 cm/s MV Decel Time: 246 msec MV E velocity: 90.80 cm/s MV A velocity: 137.00 cm/s MV E/A ratio:  0.66 Eleonore Chiquito MD Electronically signed by Eleonore Chiquito MD Signature Date/Time: 10/06/2021/4:20:52 PM    Final    CT HEAD CODE STROKE WO CONTRAST  Result Date: 11/03/2021 CLINICAL DATA:  Code stroke. Initial evaluation for neuro deficit, stroke suspected. EXAM: CT HEAD WITHOUT CONTRAST TECHNIQUE: Contiguous axial images were obtained from the base of the skull through the vertex without intravenous contrast. COMPARISON:  Prior MRI from 10/06/2021. FINDINGS: Brain: Age-related cerebral atrophy with chronic small vessel ischemic disease. Small remote lacunar infarct at the right caudate. No acute intracranial hemorrhage. No acute large vessel territory infarct. No mass lesion  or mass effect. No hydrocephalus or extra fluid collection. Vascular: No hyperdense vessel. Scattered vascular calcifications noted within the carotid siphons. Skull: Scalp soft tissues and calvarium within normal limits. Sinuses/Orbits: Globes orbital soft tissues demonstrate no acute finding. Paranasal  sinuses are clear. No mastoid effusion. Other: None. ASPECTS [facility] Stroke Program Early CT Score) - Ganglionic level infarction (caudate, lentiform nuclei, internal capsule, insula, M1-M3 cortex): 7 - Supraganglionic infarction (M4-M6 cortex): 3 Total score (0-10 with 10 being normal): 10 IMPRESSION: 1. No acute intracranial infarct or other abnormality. 2. ASPECTS is 10. 3. Age-related cerebral atrophy with chronic microvascular ischemic disease. Small remote lacunar infarct at the right caudate. Critical Value/emergent results were called by telephone at the time of interpretation on 11/03/2021 at 9:19 pm to provider JOSHUA LONG , who verbally acknowledged these results. Electronically Signed   By: Jeannine Boga M.D.   On: 11/03/2021 21:20     Subjective: - no chest pain, shortness of breath, no abdominal pain, nausea or vomiting.   Discharge Exam: BP (!) 142/86 (BP Location: Left Arm)   Pulse 81   Temp 97.7 F (36.5 C) (Oral)   Resp 20   Ht 5' 4.5" (1.638 m)   Wt 62.3 kg   SpO2 98%   BMI 23.21 kg/m   General: Pt is alert, awake, not in acute distress Cardiovascular: RRR, S1/S2 +, no rubs, no gallops Respiratory: CTA bilaterally, no wheezing, no rhonchi Abdominal: Soft, NT, ND, bowel sounds + Extremities: no edema, no cyanosis    The results of significant diagnostics from this hospitalization (including imaging, microbiology, ancillary and laboratory) are listed below for reference.     Microbiology: Recent Results (from the past 240 hour(s))  Resp Panel by RT-PCR (Flu A&B, Covid) Nasopharyngeal Swab     Status: None   Collection Time: 11/03/21  8:56 PM   Specimen:  Nasopharyngeal Swab; Nasopharyngeal(NP) swabs in vial transport medium  Result Value Ref Range Status   SARS Coronavirus 2 by RT PCR NEGATIVE NEGATIVE Final    Comment: (NOTE) SARS-CoV-2 target nucleic acids are NOT DETECTED.  The SARS-CoV-2 RNA is generally detectable in upper respiratory specimens during the acute phase of infection. The lowest concentration of SARS-CoV-2 viral copies this assay can detect is 138 copies/mL. A negative result does not preclude SARS-Cov-2 infection and should not be used as the sole basis for treatment or other patient management decisions. A negative result may occur with  improper specimen collection/handling, submission of specimen other than nasopharyngeal swab, presence of viral mutation(s) within the areas targeted by this assay, and inadequate number of viral copies(<138 copies/mL). A negative result must be combined with clinical observations, patient history, and epidemiological information. The expected result is Negative.  Fact Sheet for Patients:  EntrepreneurPulse.com.au  Fact Sheet for Healthcare Providers:  IncredibleEmployment.be  This test is no t yet approved or cleared by the [place] FDA and  has been authorized for detection and/or diagnosis of SARS-CoV-2 by FDA under an Emergency Use Authorization (EUA). This EUA will remain  in effect (meaning this test can be used) for the duration of the COVID-19 declaration under Section 564(b)(1) of the Act, 21 U.S.C.section 360bbb-3(b)(1), unless the authorization is terminated  or revoked sooner.       Influenza A by PCR NEGATIVE NEGATIVE Final   Influenza B by PCR NEGATIVE NEGATIVE Final    Comment: (NOTE) The Xpert Xpress SARS-CoV-2/FLU/RSV plus assay is intended as an aid in the diagnosis of influenza from Nasopharyngeal swab specimens and should not be used as a sole basis for treatment. Nasal washings and aspirates are unacceptable for  Xpert Xpress SARS-CoV-2/FLU/RSV testing.  Fact Sheet for Patients: EntrepreneurPulse.com.au  Fact Sheet for Healthcare Providers: IncredibleEmployment.be  This test is not  yet approved or cleared by the [place] and has been authorized for detection and/or diagnosis of SARS-CoV-2 by FDA under an Emergency Use Authorization (EUA). This EUA will remain in effect (meaning this test can be used) for the duration of the COVID-19 declaration under Section 564(b)(1) of the Act, 21 U.S.C. section 360bbb-3(b)(1), unless the authorization is terminated or revoked.  Performed at [facility], [address], [place], [place] [address]      Labs: Basic Metabolic Panel: Recent Labs  Lab 11/03/21 2110 11/03/21 2308 11/04/21 0417  NA 137 139 134*  K 4.9 4.8 4.9  CL 107 106 106  CO2 23  --  20*  GLUCOSE 84 88 91  BUN 29* 27* 29*  CREATININE 0.94 1.00 1.09*  CALCIUM 8.2*  --  7.9*   Liver Function Tests: Recent Labs  Lab 11/03/21 2110 11/04/21 0417  AST 22 27  ALT 17 16  ALKPHOS 61 57  BILITOT 0.5 0.6  PROT 6.5 5.8*  ALBUMIN 3.1* 2.8*   CBC: Recent Labs  Lab 11/03/21 2110 11/03/21 2308 11/04/21 0417  WBC 12.8*  --  11.9*  NEUTROABS 10.5*  --   --   HGB 12.0 13.3 11.4*  HCT 41.6 39.0 39.8  MCV 81.1  --  80.7  PLT 342  --  293   CBG: Recent Labs  Lab 11/03/21 2115  GLUCAP 78   Hgb A1c Recent Labs    11/04/21 0417  HGBA1C 4.9   Lipid Profile Recent Labs    11/04/21 0417  CHOL 173  HDL 35*  LDLCALC 99  TRIG 193*  CHOLHDL 4.9   Thyroid function studies No results for input(s): TSH, T4TOTAL, T3FREE, THYROIDAB in the last 72 hours.  Invalid input(s): FREET3 Urinalysis    Component Value Date/Time   COLORURINE STRAW (A) 11/03/2021 2258   APPEARANCEUR CLEAR 11/03/2021 2258   LABSPEC 1.005 11/03/2021 2258   PHURINE 7.0 11/03/2021 2258   GLUCOSEU 50 (A) 11/03/2021 2258   HGBUR NEGATIVE 11/03/2021 2258    [place] 11/03/2021 2258   KETONESUR NEGATIVE 11/03/2021 2258   PROTEINUR >=300 (A) 11/03/2021 2258   NITRITE NEGATIVE 11/03/2021 2258   LEUKOCYTESUR NEGATIVE 11/03/2021 2258    FURTHER DISCHARGE INSTRUCTIONS:   Get Medicines reviewed and adjusted: Please take all your medications with you for your next visit with your Primary MD   Laboratory/radiological data: Please request your Primary MD to go over all hospital tests and procedure/radiological results at the follow up, please ask your Primary MD to get all Hospital records sent to his/her office.   In some cases, they will be blood work, cultures and biopsy results pending at the time of your discharge. Please request that your primary care M.D. goes through all the records of your hospital data and follows up on these results.   Also Note the following: If you experience worsening of your admission symptoms, develop shortness of breath, life threatening emergency, suicidal or homicidal thoughts you must seek medical attention immediately by calling 911 or calling your MD immediately  if symptoms less severe.   You must read complete instructions/literature along with all the possible adverse reactions/side effects for all the Medicines you take and that have been prescribed to you. Take any new Medicines after you have completely understood and accpet all the possible adverse reactions/side effects.    Do not drive when taking Pain medications or sleeping medications (Benzodaizepines)   Do not take more than prescribed Pain, Sleep and  Anxiety Medications. It is not advisable to combine anxiety,sleep and pain medications without talking with your primary care practitioner   Special Instructions: If you have smoked or chewed Tobacco  in the last 2 yrs please stop smoking, stop any regular Alcohol  and or any Recreational drug use.   Wear Seat belts while driving.   Please note: You were cared for by a hospitalist during  your hospital stay. Once you are discharged, your primary care physician will handle any further medical issues. Please note that NO REFILLS for any discharge medications will be authorized once you are discharged, as it is imperative that you return to your primary care physician (or establish a relationship with a primary care physician if you do not have one) for your post hospital discharge needs so that they can reassess your need for medications and monitor your lab values.  Time coordinating discharge: 40 minutes  SIGNED:  Marzetta Board, MD, PhD 11/04/2021, 3:05 PM

## 2021-11-04 NOTE — Progress Notes (Signed)
VASCULAR SURGERY:  Dr Cruzita Lederer called to discuss this patient who was an inpatient at [facility].  She was admitted yesterday with some transient left-sided weakness resolved fairly quickly.  The patient was seen by Dr. Donnetta Hutching on 10/29/2021 with bilateral carotid disease.  At that time her only complaint was a right frontal headache and a transient episode of slurred speech and facial droop.  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  she also had a carotid duplex scan at that time which showed a greater than 70% left carotid stenosis with a 50 to 69% right carotid stenosis.  She was discharged to home.  Of note, Dr. Donnetta Hutching recommended a CT angiogram and this was scheduled for 10/11/2021.  The patient was then to follow-up with him on 10/19/2021.  MRI this admission showed no acute pathology.  There were multifocal stenoses of the intracranial vasculature similar to what was seen on 10/06/2021.  Specifically there were focal stenosis of bilateral A2 segments and bilateral PCAs.  There was an approximately 30 to 40% stenosis of the right proximal internal carotid artery and a 70% stenosis of the proximal left internal carotid artery.  Given the left-sided symptoms consistent with a right brain event there is only minimal stenosis noted in the proximal right internal carotid artery.  Of note, the patient is on Eliquis, aspirin, and Zetia for her cholesterol.  Neurology had wanted a CT angiogram of the head and neck.  This had previously been scheduled as an outpatient by [facility].  I think it would be reasonable to obtain her CT angiogram while she is in the hospital.  She can be discharged from our standpoint and I will try to move her follow-up appointment with Dr. Donnetta Hutching to sooner.  Certainly if there is any change she could be transferred to [facility] on the  medical service and we would be happy to see her in consultation.  Gae Gallop, MD 1:13 PM

## 2021-11-04 NOTE — H&P (Signed)
TRH H&P    Patient Demographics:    [name], is a 70 y.o. female  MRN: [id number]  DOB - [birth year]  Admit Date - 11/03/2021  Referring MD/NP/PA: Long  Outpatient Primary MD for the patient is Ivy Lynn, NP  Patient coming from: Home  Chief complaint- Left sided numbness   HPI:    [name]  is a 70 y.o. female, with history of CAD, PAD, HTN, HLD, CVA, PAF, and more presents to the ED with a  chief complaint of left sided numbness.  Patient reports that she walks with a walker because of her right side prosthetic.  She was ambulating normally when she had sudden onset of numbness and tingling in her fingers, which spread to her arm, then face, then mouth, and then she noticed in her leg.  She reports that the sensation was definitely numbness.  In her mouth it felt like she had had a Novocain shot.  She had a left-sided facial droop.  She did not notice any slurred speech, but daughter at bedside reports that she did have slurred speech.  She reports associated headache.  She describes the headache as her right temple feeling like blunt force trauma.  Headache is not as bad as it was when she presented to the ED about a month ago.  She had no changes in vision, no changes in hearing.  She does report that it felt like she was drooling on the left side of her mouth.  It felt difficult to swallow but she did not have any choking spells.  Other times she arrived to the ER her numbness was gone except for in her face.  She reports that her face numbness is gone now, but she just feels like something is still off in her left upper and lower extremity.  She does not describe it as weakness or numbness, just something off.  Patient reports nothing made her symptoms worse, nothing resolved them.  It is similar to the episode that she had in October.  Patient has no other complaints at this time.  Patient does not  smoke, does not drink, does not use illicit drugs.  She is not vaccinated for COVID.  Patient is DNR with plans to donate her body to [facility].  In the ED Temp 98.7, heart rate 70-72, respiratory 18-22, blood pressure 193/94, satting at 98% Leukocytosis of 12.8 hemoglobin 12.0 platelets 342 Chemistry panel is unremarkable aside from a low albumin at 3.1 CT code stroke shows no acute infarct, age-related cerebral atrophy with chronic microvascular disease.  Small remote lacunar infarct in right caudate.  Patient was supposed to have a CTA on 22 November.  Because of that is, the neurologist recommended CTA, but reportedly told ED doctor that MRI would be fine as well.    Review of systems:    In addition to the HPI above,  No Fever-chills, No Chest pain, Cough or Shortness of Breath, No Abdominal pain, No Nausea or Vomiting, bowel movements are regular, No Blood in stool or  Urine, No dysuria, No new skin rashes or bruises, No new joints pains-aches,  No recent weight gain or loss, No polyuria, polydypsia or polyphagia, No significant Mental Stressors.  All other systems reviewed and are negative.    Past History of the following :    Past Medical History:  Diagnosis Date   Arthritis    "fingers, some in my knees" (01/28/2016)   CAD (coronary artery disease), per cath 04/27/13, non obstructive disease, EF 65-70% 05/11/2013   Carotid disease, bilateral ([place]) 09/20/2013   Lt carotid 50-69% stentosis, less on rt   Dysrhythmia    Fatty liver    GERD (gastroesophageal reflux disease)    Glaucoma    H/O cardiovascular stress test 12/27/2009   negative for ischemia   Headache    "occasional since eye pressure regulated" (01/28/2016)   Hyperlipidemia    Hypertension    Leukocytosis 07/15/2015   Migraine    "stopped w/laser holes to relieve pressure in my eyes; had them 3-4 times/wk before taht" (01/28/2016)   Mitral regurgitation,2+ by cath 05/11/2013   NSTEMI (non-ST elevated  myocardial infarction) ([place]) 04/27/2013   Pain    BOTH KNEES - PT HAS TORN MENISCUS LEFT KNEE   Paroxysmal atrial fibrillation (HCC)    hx of a fib on ASA  AND ELIQUIS    Polycythemia vera ([place]) 08/20/2015   JAK-2 positive 07/19/15   PVD (peripheral vascular disease) with claudication ([place]) 07/21/2006   previous L ext iliac stent and rt SFA stent, known occluded Lt SFA   S/P cardiac cath 04/27/2013   NON OBSTRUCTIVE DISEASE, 2+ mr, ef 65-70%   Statin intolerance    Stroke ([place]) 12/21/2004   SWELLING ALL OVER AND RT SIDE OF FACE DRAWN AND SPEECH SLURRED AND NUMBNESS ON RIGHT SIDE-- ALL RESOLVED   TIA (transient ischemic attack)       Past Surgical History:  Procedure Laterality Date   AMPUTATION Right 05/21/2017   Procedure: AMPUTATION ABOVE KNEE;  Surgeon: Rosetta Posner, MD;  Location: [place] OR;  Service: Vascular;  Laterality: Right;   CARDIAC CATHETERIZATION  04/27/13   non occlusive disease with mild to mod. calcified lesions in ostial LM and proximal LAD and moderate prox. RC AND DISTAL AV GROOVE LCX, ef   CATARACT EXTRACTION W/PHACO Right 03/21/2015   Procedure: CATARACT EXTRACTION PHACO AND INTRAOCULAR LENS PLACEMENT RIGHT EYE;  Surgeon: Baruch Goldmann, MD;  [facility];  Service: Ophthalmology;  Laterality: Right;  CDE:5.10   CATARACT EXTRACTION W/PHACO Left 05/16/2015   Procedure: CATARACT EXTRACTION PHACO AND INTRAOCULAR LENS PLACEMENT (IOC);  Surgeon: Baruch Goldmann, MD;  Location: [facility];  Service: Ophthalmology;  Laterality: Left;  CDE 5.57   CESAREAN SECTION  1977   COLONOSCOPY N/A 09/11/2016   Procedure: COLONOSCOPY;  Surgeon: Rogene Houston, MD;  [facility];  Service: Endoscopy;  Laterality: N/A;  730-moved to 1:00 Ann notified pt   EMBOLECTOMY Right 02/01/2016   Procedure: Thrombectomy of Right Femoral-Popliteal Bypass Graft; Endarterectomy of Right Below Knee Popliteal Artery and Tibial Peroneal Trunk with Bovine Pericardium Patch Angioplasty.;  Surgeon:  Angelia Mould, MD;  Location: [facility];  Service: Vascular;  Laterality: Right;   FEMORAL-POPLITEAL BYPASS GRAFT Right 01/31/2016   Procedure: BYPASS GRAFT RIGHT COMMON  FEMORALTO BELOW KNEE POPLITEAL ARTERY BYPASS GRAFT USING 6MM PROPATEN GORTEX GRAFT;  Surgeon: Mal Misty, MD;  Location: [name];  Service: Vascular;  Laterality: Right;   FEMORAL-POPLITEAL BYPASS GRAFT Right 01/09/2017   Procedure: THROMBECTOMY  OF RIGHT FEMORAL-POPLITEAL  ARTERY BYPASS GRAFT; THROMBECTOMY RIGHT  TIBIAL VESSELS;  Surgeon: [name], MD;  Location: [facility];  Service: Vascular;  Laterality: Right;   FEMORAL-POPLITEAL BYPASS GRAFT Right 05/17/2017   Procedure: RIGHT FEMORAL-TIBIAL PERONEAL TRUNK ARTERY BYPASS GRAFT USING 79mmX80cm PROPATEN GRAFT WITH REMOVABLE RING;  Surgeon: Rosetta Posner, MD;  Location: [facility];  Service: Vascular;  Laterality: Right;   FRACTURE SURGERY     ILIAC ARTERY STENT  07/2006   external iliac and Rt SFA stent 07/2006 AND THE LEFT WAS IN 2006   KNEE ARTHROSCOPY WITH MEDIAL MENISECTOMY Left 03/27/2014   Procedure: left knee arthorscopy with medial chondraplasty of the medial femoral and patella, medial microfracture technique of medial femoral condyl;  Surgeon: Tobi Bastos, MD;  [facility];  Service: Orthopedics;  Laterality: Left;   LEFT HEART CATHETERIZATION WITH CORONARY ANGIOGRAM Right 04/27/2013   Procedure: LEFT HEART CATHETERIZATION WITH CORONARY ANGIOGRAM;  Surgeon: Leonie Man, MD;  Location: [facility] CATH LAB;  Service: Cardiovascular;  Laterality: Right;   LOWER EXTREMITY ANGIOGRAM Right 01/31/2016   Procedure: INTRAOP RIGHT LOWER EXTREMITY ANGIOGRAM;  Surgeon: Mal Misty, MD;  Location: [facility];  Service: Vascular;  Laterality: Right;   ORIF WRIST FRACTURE Right 1983   OVARIAN CYST REMOVAL Right    PATCH ANGIOPLASTY Right 01/31/2016   Procedure: RIGHT COMMON FEMORAL AND PROFUNDA FEMORIS ENDARECTOMY WITH PATCH ANGIOPLASTY;  Surgeon: Mal Misty, MD;  Location: [facility];   Service: Vascular;  Laterality: Right;   PATCH ANGIOPLASTY Right 01/09/2017   Procedure: PATCH ANGIOPLASTY RIGHT POPLITEAL ARTERY BYPASS GRAFT;  Surgeon: [name], MD;  Location: [facility];  Service: Vascular;  Laterality: Right;   PERIPHERAL VASCULAR CATHETERIZATION N/A 06/27/2015   Procedure: Lower Extremity Angiography;  Surgeon: Lorretta Harp, MD;  Location: [facility];  Service: Cardiovascular;  Laterality: N/A;   PERIPHERAL VASCULAR CATHETERIZATION Bilateral 01/29/2016   Procedure: Lower Extremity Angiography;  Surgeon: Wellington Hampshire, MD;  Location: [facility] CV LAB;  Service: Cardiovascular;  Laterality: Bilateral;   PERIPHERAL VASCULAR CATHETERIZATION N/A 01/29/2016   Procedure: Abdominal Aortogram;  Surgeon: Wellington Hampshire, MD;  Location: [facility] CV LAB;  Service: Cardiovascular;  Laterality: N/A;   REFRACTIVE SURGERY Bilateral    "6 in one eye; 7 in the other; to relieve pressure; not glaucoma" (01/28/2016)   SFA Right 06/27/2015   overlapping Bahn covered stents   [place] Left 05/17/2017   Procedure: LEFT LEG GREATER SAPHENOUS VEIN HARVEST;  Surgeon: Rosetta Posner, MD;  Location: [facility];  Service: Vascular;  Laterality: Left;   VEIN REPAIR Right 01/31/2016   Procedure: RIGHT GREATER SAPHENOUS VEIN EXAMNED BUT NOT REMOVED;  Surgeon: Mal Misty, MD;  Location: [facility] OR;  Service: Vascular;  Laterality: Right;      Social History:      Social History   Tobacco Use   Smoking status: Former    Packs/day: 1.00    Years: 45.00    Pack years: 45.00    Types: Cigarettes    Quit date: 11/11/2012    Years since quitting: 8.9   Smokeless tobacco: Never   Tobacco comments:    quit 4 years ago.    10/27/21-Pt declines lung ca screen at this time.   Substance Use Topics   Alcohol use: No    Alcohol/week: 0.0 standard drinks       Family History :  Family History  Problem Relation Age of Onset   CAD Mother    Lung cancer  Mother    Heart attack Mother    CAD Father    Heart disease Father    Heart attack Father 6       died in hes 68's with heart disease   Healthy Sister    Liver cancer Brother    Healthy Sister    CAD Brother        has had CABG   CAD Brother       Home Medications:   Prior to Admission medications   Medication Sig Start Date End Date Taking? Authorizing Provider  acetaminophen (TYLENOL) 500 MG tablet Take 2 tablets (1,000 mg total) by mouth every 6 (six) hours. 09/16/18  Yes Norm Parcel, PA-C  ARTIFICIAL TEAR OP Place 1 drop into both eyes as needed (for dry eyes).   Yes [provider]  aspirin EC 325 MG tablet Take 325 mg by mouth daily.   Yes [provider]  calcium-vitamin D (OSCAL WITH D) 500-200 MG-UNIT tablet Take 1 tablet by mouth daily with breakfast. 06/09/21  Yes Ivy Lynn, NP  diclofenac Sodium (VOLTAREN) 1 % GEL Apply 2 g topically 4 (four) times daily. 09/03/21  Yes Ivy Lynn, NP  diltiazem (CARDIZEM CD) 240 MG 24 hr capsule TAKE 1 CAPSULE EVERY MORNING Patient taking differently: Take 240 mg by mouth daily. 08/11/21  Yes Ivy Lynn, NP  diphenhydrAMINE (BENADRYL) 25 MG tablet Take 25 mg by mouth every 6 (six) hours as needed.   Yes [provider]  ELIQUIS 5 MG TABS tablet TAKE ONE TABLET TWICE DAILY 10/01/21  Yes Ivy Lynn, NP  escitalopram (LEXAPRO) 20 MG tablet Take 1 tablet (20 mg total) by mouth daily. 10/15/21  Yes Ivy Lynn, NP  ezetimibe (ZETIA) 10 MG tablet Take 1 tablet (10 mg total) by mouth daily. 10/15/21  Yes Ivy Lynn, NP  fenofibrate (TRICOR) 145 MG tablet Take 1 tablet (145 mg total) by mouth daily. (NEEDS TO BE SEEN BEFORE NEXT REFILL) 10/15/21  Yes Ivy Lynn, NP  flecainide (TAMBOCOR) 50 MG tablet TAKE 1 TABLET 2 TIMES A DAY 10/09/21  Yes Ivy Lynn, NP  hydroxyurea (HYDREA) 500 MG capsule Take 1 capsule (500 mg total) by mouth daily. Take one capsule daily except  Sundays. May take with food to minimize GI side effects. 10/17/21  Yes Truitt Merle, MD  omeprazole (PRILOSEC) 20 MG capsule TAKE 1 CAPSULE 2 TIMES A DAY BEFORE A MEAL Patient taking differently: Take 20 mg by mouth 2 (two) times daily before a meal. 09/02/21  Yes Ivy Lynn, NP  traMADol (ULTRAM) 50 MG tablet Take 50 mg by mouth daily as needed for moderate pain.   Yes [provider]  valsartan (DIOVAN) 160 MG tablet TAKE ONE TABLET ONCE DAILY Patient taking differently: Take 160 mg by mouth daily. 06/10/21  Yes Ivy Lynn, NP  chlorthalidone (HYGROTON) 25 MG tablet Take 1 tablet (25 mg total) by mouth daily. Patient not taking: No sig reported 01/22/21   Ivy Lynn, NP     Allergies:     Allergies  Allergen Reactions   Atenolol Rash and Itching   Demerol  [Meperidine Hcl] Itching   Demerol [Meperidine] Other (See Comments) and Itching    Unknown, can't remember  Unknown, can't remember    Gabapentin Anxiety and Other (See Comments)    SI  and HI   Inderal [Propranolol] Other (See Comments) and Itching    Doesn't remember    Prednisone Other (See Comments)    Muscle spasms   Statins Itching and Other (See Comments)    Simvastatin-caused severe itching Pravastatin-caused lesser tiching One type of statin? Caused stroke in 2006, and other symptoms as result   Warfarin And Related Other (See Comments)    Caused nose bleeds   Crestor [Rosuvastatin] Itching and Rash   Docosahexaenoic Acid (Dha)     Other reaction(s): Other (See Comments) nosebleeds   Lactose Intolerance (Gi) Other (See Comments)    Bloating, gas   Lipitor [Atorvastatin] Rash   Lovaza [Omega-3-Acid Ethyl Esters] Other (See Comments)    nosebleeds    Penicillins Hives, Rash and Other (See Comments)    Has patient had a PCN reaction causing immediate rash, facial/tongue/throat swelling, SOB or lightheadedness with hypotension: Yes Has patient had a PCN reaction causing severe rash involving  mucus membranes or skin necrosis: No Has patient had a PCN reaction that required hospitalization: No Has patient had a PCN reaction occurring within the last 10 years: No If all of the above answers are "NO", then may proceed with Cephalosporin use.  Questionable high fever    Pravastatin Itching and Rash   Simvastatin Itching, Rash and Other (See Comments)   Trilipix [Choline Fenofibrate] Other (See Comments)    Doesn't remember    Warfarin Other (See Comments)    nosebleeds   Hydralazine Swelling   Effexor [Venlafaxine Hcl] Other (See Comments)    Doesn't remember    Nexium [Esomeprazole Magnesium] Rash   Percocet [Oxycodone-Acetaminophen] Other (See Comments)    Doesn't remember    Plavix [Clopidogrel Bisulfate] Rash     Physical Exam:   Vitals  Blood pressure (!) 147/87, pulse 65, temperature 98 F (36.7 C), temperature source Oral, resp. rate 19, height 5' 4.5" (1.638 m), weight 62.3 kg, SpO2 98 %.   1.  General: Patient lying supine in bed,  no acute distress   2. Psychiatric: Alert and oriented x 3, mood and behavior normal for situation, pleasant and cooperative with exam   3. Neurologic: Speech and language are normal,left side facial droop, moves all 3 extremities voluntarily, sensation intact BL upper extremities, no contralateral ex to compare left lower extremity to, normal strength   4. HEENMT:  Head is atraumatic, normocephalic, pupils reactive to light, neck is supple, trachea is midline, mucous membranes are moist   5. Respiratory : Lungs are clear to auscultation bilaterally without wheezing, rhonchi, rales, no cyanosis, no increase in work of breathing or accessory muscle use   6. Cardiovascular : Heart rate normal, rhythm is regular, no murmurs, rubs or gallops, no peripheral edema, peripheral pulses palpated   7. Gastrointestinal:  Abdomen is soft, nondistended, nontender to palpation bowel sounds active, no masses or organomegaly palpated   8.  Skin:  Skin is warm, dry and intact without rashes, acute lesions, or ulcers on limited exam   9.Musculoskeletal:  No acute deformities or trauma, no asymmetry in tone, no peripheral edema, peripheral pulses palpated, no tenderness to palpation in the extremities     Data Review:    CBC Recent Labs  Lab 11/03/21 2110 11/03/21 2308  WBC 12.8*  --   HGB 12.0 13.3  HCT 41.6 39.0  PLT 342  --   MCV 81.1  --   MCH 23.4*  --   MCHC 28.8*  --   RDW 19.9*  --  LYMPHSABS 1.2  --   MONOABS 0.4  --   EOSABS 0.4  --   BASOSABS 0.1  --    ------------------------------------------------------------------------------------------------------------------  Results for orders placed or performed during the hospital encounter of 11/03/21 (from the past 48 hour(s))  Resp Panel by RT-PCR (Flu A&B, Covid) Nasopharyngeal Swab     Status: None   Collection Time: 11/03/21  8:56 PM   Specimen: Nasopharyngeal Swab; Nasopharyngeal(NP) swabs in vial transport medium  Result Value Ref Range   SARS Coronavirus 2 by RT PCR NEGATIVE NEGATIVE    Comment: (NOTE) SARS-CoV-2 target nucleic acids are NOT DETECTED.  The SARS-CoV-2 RNA is generally detectable in upper respiratory specimens during the acute phase of infection. The lowest concentration of SARS-CoV-2 viral copies this assay can detect is 138 copies/mL. A negative result does not preclude SARS-Cov-2 infection and should not be used as the sole basis for treatment or other patient management decisions. A negative result may occur with  improper specimen collection/handling, submission of specimen other than nasopharyngeal swab, presence of viral mutation(s) within the areas targeted by this assay, and inadequate number of viral copies(<138 copies/mL). A negative result must be combined with clinical observations, patient history, and epidemiological information. The expected result is Negative.  Fact Sheet for Patients:   EntrepreneurPulse.com.au  Fact Sheet for Healthcare Providers:  IncredibleEmployment.be  This test is no t yet approved or cleared by the [place] FDA and  has been authorized for detection and/or diagnosis of SARS-CoV-2 by FDA under an Emergency Use Authorization (EUA). This EUA will remain  in effect (meaning this test can be used) for the duration of the COVID-19 declaration under Section 564(b)(1) of the Act, 21 U.S.C.section 360bbb-3(b)(1), unless the authorization is terminated  or revoked sooner.       Influenza A by PCR NEGATIVE NEGATIVE   Influenza B by PCR NEGATIVE NEGATIVE    Comment: (NOTE) The Xpert Xpress SARS-CoV-2/FLU/RSV plus assay is intended as an aid in the diagnosis of influenza from Nasopharyngeal swab specimens and should not be used as a sole basis for treatment. Nasal washings and aspirates are unacceptable for Xpert Xpress SARS-CoV-2/FLU/RSV testing.  Fact Sheet for Patients: EntrepreneurPulse.com.au  Fact Sheet for Healthcare Providers: IncredibleEmployment.be  This test is not yet approved or cleared by the [place] FDA and has been authorized for detection and/or diagnosis of SARS-CoV-2 by FDA under an Emergency Use Authorization (EUA). This EUA will remain in effect (meaning this test can be used) for the duration of the COVID-19 declaration under Section 564(b)(1) of the Act, 21 U.S.C. section 360bbb-3(b)(1), unless the authorization is terminated or revoked.  Performed at [facility], [address], [place], [place] [address]   Ethanol     Status: None   Collection Time: 11/03/21  9:10 PM  Result Value Ref Range   Alcohol, Ethyl (B) <10 <10 mg/dL    Comment: Performed at [facility], [address], [place], [place] [address]  Protime-INR     Status: None   Collection Time: 11/03/21  9:10 PM  Result Value Ref Range   Prothrombin Time 14.5 11.4 - 15.2  seconds   INR 1.1 0.8 - 1.2    Comment: (NOTE) INR goal varies based on device and disease states. Performed at [facility], [address] [place],  [address]   APTT     Status: Abnormal   Collection Time: 11/03/21  9:10 PM  Result Value Ref Range   aPTT 48 (H) 24 -  36 seconds    Comment:        IF BASELINE aPTT IS ELEVATED, SUGGEST PATIENT RISK ASSESSMENT BE USED TO DETERMINE APPROPRIATE ANTICOAGULANT THERAPY. Performed at [facility], [address] [place], [place] [address]   CBC     Status: Abnormal   Collection Time: 11/03/21  9:10 PM  Result Value Ref Range   WBC 12.8 (H) 4.0 - 10.5 K/uL   RBC 5.13 (H) 3.87 - 5.11 MIL/uL   Hemoglobin 12.0 12.0 - 15.0 g/dL   HCT 41.6 36.0 - 46.0 %   MCV 81.1 80.0 - 100.0 fL   MCH 23.4 (L) 26.0 - 34.0 pg   MCHC 28.8 (L) 30.0 - 36.0 g/dL   RDW 19.9 (H) 11.5 - 15.5 %   Platelets 342 150 - 400 K/uL   nRBC 0.2 0.0 - 0.2 %    Comment: Performed at [facility], [address], [place], [place] [address]  Differential     Status: Abnormal   Collection Time: 11/03/21  9:10 PM  Result Value Ref Range   Neutrophils Relative % 83 %   Neutro Abs 10.5 (H) 1.7 - 7.7 K/uL   Lymphocytes Relative 9 %   Lymphs Abs 1.2 0.7 - 4.0 K/uL   Monocytes Relative 3 %   Monocytes Absolute 0.4 0.1 - 1.0 K/uL   Eosinophils Relative 3 %   Eosinophils Absolute 0.4 0.0 - 0.5 K/uL   Basophils Relative 1 %   Basophils Absolute 0.1 0.0 - 0.1 K/uL   Immature Granulocytes 1 %   Abs Immature Granulocytes 0.17 (H) 0.00 - 0.07 K/uL    Comment: Performed at [facility], [address], [place], [place] [address]  Comprehensive metabolic panel     Status: Abnormal   Collection Time: 11/03/21  9:10 PM  Result Value Ref Range   Sodium 137 135 - 145 mmol/L   Potassium 4.9 3.5 - 5.1 mmol/L   Chloride 107 98 - 111 mmol/L   CO2 23 22 - 32 mmol/L   Glucose, Bld 84 70 - 99 mg/dL    Comment: Glucose reference range applies only to samples taken after fasting for at  least 8 hours.   BUN 29 (H) 8 - 23 mg/dL   Creatinine, Ser 0.94 0.44 - 1.00 mg/dL   Calcium 8.2 (L) 8.9 - 10.3 mg/dL   Total Protein 6.5 6.5 - 8.1 g/dL   Albumin 3.1 (L) 3.5 - 5.0 g/dL   AST 22 15 - 41 U/L   ALT 17 0 - 44 U/L   Alkaline Phosphatase 61 38 - 126 U/L   Total Bilirubin 0.5 0.3 - 1.2 mg/dL   GFR, Estimated >60 >60 mL/min    Comment: (NOTE) Calculated using the CKD-EPI Creatinine Equation (2021)    Anion gap 7 5 - 15    Comment: Performed at [facility], [address] [place], [place] [address]  CBG monitoring, ED     Status: None   Collection Time: 11/03/21  9:15 PM  Result Value Ref Range   Glucose-Capillary 78 70 - 99 mg/dL    Comment: Glucose reference range applies only to samples taken after fasting for at least 8 hours.  Urine rapid drug screen (hosp performed)     Status: None   Collection Time: 11/03/21 10:58 PM  Result Value Ref Range   Opiates NONE DETECTED NONE DETECTED   Cocaine NONE DETECTED NONE DETECTED   Benzodiazepines NONE DETECTED NONE DETECTED   Amphetamines NONE DETECTED NONE  DETECTED   Tetrahydrocannabinol NONE DETECTED NONE DETECTED   Barbiturates NONE DETECTED NONE DETECTED    Comment: (NOTE) DRUG SCREEN FOR MEDICAL PURPOSES ONLY.  IF CONFIRMATION IS NEEDED FOR ANY PURPOSE, NOTIFY LAB WITHIN 5 DAYS.  LOWEST DETECTABLE LIMITS FOR URINE DRUG SCREEN Drug Class                     Cutoff (ng/mL) Amphetamine and metabolites    1000 Barbiturate and metabolites    200 Benzodiazepine                 793 Tricyclics and metabolites     300 Opiates and metabolites        300 Cocaine and metabolites        300 THC                            50 Performed at [facility], [address], [place], [place] [address]   Urinalysis, Routine w reflex microscopic Urine, Clean Catch     Status: Abnormal   Collection Time: 11/03/21 10:58 PM  Result Value Ref Range   Color, Urine STRAW (A) YELLOW   APPearance CLEAR CLEAR   Specific Gravity, Urine  1.005 1.005 - 1.030   pH 7.0 5.0 - 8.0   Glucose, UA 50 (A) NEGATIVE mg/dL   Hgb urine dipstick NEGATIVE NEGATIVE   Bilirubin Urine NEGATIVE NEGATIVE   Ketones, ur NEGATIVE NEGATIVE mg/dL   Protein, ur >=300 (A) NEGATIVE mg/dL   Nitrite NEGATIVE NEGATIVE   Leukocytes,Ua NEGATIVE NEGATIVE   RBC / HPF 0-5 0 - 5 RBC/hpf   WBC, UA 0-5 0 - 5 WBC/hpf   Bacteria, UA RARE (A) NONE SEEN   Squamous Epithelial / LPF 0-5 0 - 5    Comment: Performed at [facility], [address], [place], [place] [address]  I-stat chem 8, ED     Status: Abnormal   Collection Time: 11/03/21 11:08 PM  Result Value Ref Range   Sodium 139 135 - 145 mmol/L   Potassium 4.8 3.5 - 5.1 mmol/L   Chloride 106 98 - 111 mmol/L   BUN 27 (H) 8 - 23 mg/dL   Creatinine, Ser 1.00 0.44 - 1.00 mg/dL   Glucose, Bld 88 70 - 99 mg/dL    Comment: Glucose reference range applies only to samples taken after fasting for at least 8 hours.   Calcium, Ion 1.10 (L) 1.15 - 1.40 mmol/L   TCO2 25 22 - 32 mmol/L   Hemoglobin 13.3 12.0 - 15.0 g/dL   HCT 39.0 36.0 - 46.0 %    Chemistries  Recent Labs  Lab 11/03/21 2110 11/03/21 2308  NA 137 139  K 4.9 4.8  CL 107 106  CO2 23  --   GLUCOSE 84 88  BUN 29* 27*  CREATININE 0.94 1.00  CALCIUM 8.2*  --   AST 22  --   ALT 17  --   ALKPHOS 61  --   BILITOT 0.5  --    ------------------------------------------------------------------------------------------------------------------  ------------------------------------------------------------------------------------------------------------------ GFR: Estimated Creatinine Clearance: 46.2 mL/min (by C-G formula based on SCr of 1 mg/dL). Liver Function Tests: Recent Labs  Lab 11/03/21 2110  AST 22  ALT 17  ALKPHOS 61  BILITOT 0.5  PROT 6.5  ALBUMIN 3.1*   No results for input(s): LIPASE, AMYLASE in the last 168 hours. No results for input(s): AMMONIA in the last 168 hours. Coagulation Profile:  Recent Labs  Lab 11/03/21 2110   INR 1.1   Cardiac Enzymes: No results for input(s): CKTOTAL, CKMB, CKMBINDEX, TROPONINI in the last 168 hours. BNP (last 3 results) No results for input(s): PROBNP in the last 8760 hours. HbA1C: No results for input(s): HGBA1C in the last 72 hours. CBG: Recent Labs  Lab 11/03/21 2115  GLUCAP 78   Lipid Profile: No results for input(s): CHOL, HDL, LDLCALC, TRIG, CHOLHDL, LDLDIRECT in the last 72 hours. Thyroid Function Tests: No results for input(s): TSH, T4TOTAL, FREET4, T3FREE, THYROIDAB in the last 72 hours. Anemia Panel: No results for input(s): VITAMINB12, FOLATE, FERRITIN, TIBC, IRON, RETICCTPCT in the last 72 hours.  --------------------------------------------------------------------------------------------------------------- Urine analysis:    Component Value Date/Time   COLORURINE STRAW (A) 11/03/2021 2258   APPEARANCEUR CLEAR 11/03/2021 2258   LABSPEC 1.005 11/03/2021 2258   PHURINE 7.0 11/03/2021 2258   GLUCOSEU 50 (A) 11/03/2021 2258   HGBUR NEGATIVE 11/03/2021 2258   BILIRUBINUR NEGATIVE 11/03/2021 2258   KETONESUR NEGATIVE 11/03/2021 2258   PROTEINUR >=300 (A) 11/03/2021 2258   NITRITE NEGATIVE 11/03/2021 2258   LEUKOCYTESUR NEGATIVE 11/03/2021 2258      Imaging Results:    CT HEAD CODE STROKE WO CONTRAST  Result Date: 11/03/2021 CLINICAL DATA:  Code stroke. Initial evaluation for neuro deficit, stroke suspected. EXAM: CT HEAD WITHOUT CONTRAST TECHNIQUE: Contiguous axial images were obtained from the base of the skull through the vertex without intravenous contrast. COMPARISON:  Prior MRI from 10/06/2021. FINDINGS: Brain: Age-related cerebral atrophy with chronic small vessel ischemic disease. Small remote lacunar infarct at the right caudate. No acute intracranial hemorrhage. No acute large vessel territory infarct. No mass lesion or mass effect. No hydrocephalus or extra fluid collection. Vascular: No hyperdense vessel. Scattered vascular calcifications  noted within the carotid siphons. Skull: Scalp soft tissues and calvarium within normal limits. Sinuses/Orbits: Globes orbital soft tissues demonstrate no acute finding. Paranasal sinuses are clear. No mastoid effusion. Other: None. ASPECTS [facility] Stroke Program Early CT Score) - Ganglionic level infarction (caudate, lentiform nuclei, internal capsule, insula, M1-M3 cortex): 7 - Supraganglionic infarction (M4-M6 cortex): 3 Total score (0-10 with 10 being normal): 10 IMPRESSION: 1. No acute intracranial infarct or other abnormality. 2. ASPECTS is 10. 3. Age-related cerebral atrophy with chronic microvascular ischemic disease. Small remote lacunar infarct at the right caudate. Critical Value/emergent results were called by telephone at the time of interpretation on 11/03/2021 at 9:19 pm to provider JOSHUA LONG , who verbally acknowledged these results. Electronically Signed   By: Jeannine Boga M.D.   On: 11/03/2021 21:20       Assessment & Plan:    Active Problems:   History of transient ischemic attack (TIA)   PAF (paroxysmal atrial fibrillation), maintaing SR   Essential hypertension   Amputee, above knee, right (HCC)   TIA (transient ischemic attack)   TIA Left sided numbness, left sided facial droop Mostly resolved by time of arrival Numbness resolved after CT head Facial droop remains so far Monitor on tele Q4 neuro checks MRI/MRA head and neck Fasting lipids, hgb A1C, and troponin PT/OT/ST Echo re-ordered, but done at last admission. Consider Cardiac MRI as recommended by last echo? Leukocytosis sepsis associated with acute phase reaction Pro cal mal Encourage nutrient dense food choices Right AKA  2/2 PAD Continue aspirin and statin Afib Hold diltiazem 2/2 permissive HTN Continue flecainide and eliquis Monitor on tele HTN Hold diltiazem and valsartan 2/2 permissive HTN Goal BP 180 - 220 HLD Continue zetia and  fenofibrate   DVT Prophylaxis-    eliquis- SCD  AM  Labs Ordered, also please review Full Orders  Family Communication: Admission, patients condition and plan of care including tests being ordered have been discussed with the patient and daughter who indicate understanding and agree with the plan and Code Status.  Code Status:  DNR  Admission status: Observation Disposition: Anticipated Discharge 24 hours Discharge to Home  Time spent in minutes : [place]

## 2021-11-05 ENCOUNTER — Telehealth: Payer: Self-pay | Admitting: Vascular Surgery

## 2021-11-05 NOTE — Telephone Encounter (Signed)
I spoke with the patient by telephone regarding her CT results.  She underwent CT angio of her head and neck yesterday.  I reviewed her films and discussed these with her.  She had been evaluated with possible right carotid source for a small right brain stroke by MRI.  Her CT scan showed calcification but no significant narrowing in her right carotid artery.  She did have what was interpreted at 60% stenosis in her left internal carotid artery.  I feel that this is at least at this level but not critical.  She also had severe left subclavian stenosis but also left vertebral occlusion.  I explained that there is no indication for endarterectomy since this does not appear to be the source for her right brain event.  She will continue to monitor this.  I explained the need to return to the emergency department for any new neurologic changes.  Otherwise we will see her again in 1 year with repeat carotid duplex

## 2021-11-06 ENCOUNTER — Telehealth: Payer: Self-pay

## 2021-11-06 NOTE — Telephone Encounter (Signed)
Transition Care Management Follow-up Telephone Call Date of discharge and from where: [facility] 11/04/21 - TIA, headache How have you been since you were released from the hospital? Feels fine other than constant nagging headache Any questions or concerns? No  Items Reviewed: Did the pt receive and understand the discharge instructions provided? Yes  Medications obtained and verified? Yes  Other? No  Any new allergies since your discharge? No  Dietary orders reviewed? Yes Do you have support at home? Yes   Home Care and Equipment/Supplies: Were home health services ordered? no  Were any new equipment or medical supplies ordered?  No  Functional Questionnaire: (I = Independent and D = Dependent) ADLs: I  Bathing/Dressing- I  Meal Prep- I  Eating- I  Maintaining continence- I  Transferring/Ambulation- I  Managing Meds- I  Follow up appointments reviewed:  PCP Hospital f/u appt confirmed? Yes  Scheduled to see Onyeje on 11/22 @ 12. [facility] f/u appt confirmed? Yes  Scheduled to see [facility] on 12/02/21 @ 9. Are transportation arrangements needed? No  If their condition worsens, is the pt aware to call PCP or go to the Emergency Dept.? Yes Was the patient provided with contact information for the PCP's office or ED? Yes Was to pt encouraged to call back with questions or concerns? Yes

## 2021-11-10 NOTE — Progress Notes (Signed)
Code stroke time 2058 call time 2103 beeper time 2105 exam finished 2105 images sent to [facility] 2109 exam completed 2110 [facility] called

## 2021-11-11 ENCOUNTER — Ambulatory Visit (INDEPENDENT_AMBULATORY_CARE_PROVIDER_SITE_OTHER): Payer: 59 | Admitting: Nurse Practitioner

## 2021-11-11 ENCOUNTER — Ambulatory Visit (HOSPITAL_COMMUNITY): Payer: 59

## 2021-11-11 ENCOUNTER — Encounter: Payer: Self-pay | Admitting: Nurse Practitioner

## 2021-11-11 ENCOUNTER — Other Ambulatory Visit: Payer: Self-pay

## 2021-11-11 VITALS — BP 185/70 | HR 70 | Ht 64.5 in | Wt 138.0 lb

## 2021-11-11 DIAGNOSIS — Z7689 Persons encountering health services in other specified circumstances: Secondary | ICD-10-CM | POA: Insufficient documentation

## 2021-11-11 DIAGNOSIS — Z8673 Personal history of transient ischemic attack (TIA), and cerebral infarction without residual deficits: Secondary | ICD-10-CM

## 2021-11-11 DIAGNOSIS — I10 Essential (primary) hypertension: Secondary | ICD-10-CM

## 2021-11-11 HISTORY — DX: Persons encountering health services in other specified circumstances: Z76.89

## 2021-11-11 MED ORDER — VALSARTAN 160 MG PO TABS: 160.0000 mg | ORAL_TABLET | Freq: Two times a day (BID) | ORAL | 3 refills | Status: DC

## 2021-11-11 NOTE — Patient Instructions (Signed)
Hypertension, Adult ?Hypertension is another name for high blood pressure. High blood pressure forces your heart to work harder to pump blood. This can cause problems over time. ?There are two numbers in a blood pressure reading. There is a top number (systolic) over a bottom number (diastolic). It is best to have a blood pressure that is below 120/80. Healthy choices can help lower your blood pressure, or you may need medicine to help lower it. ?What are the causes? ?The cause of this condition is not known. Some conditions may be related to high blood pressure. ?What increases the risk? ?Smoking. ?Having type 2 diabetes mellitus, high cholesterol, or both. ?Not getting enough exercise or physical activity. ?Being overweight. ?Having too much fat, sugar, calories, or salt (sodium) in your diet. ?Drinking too much alcohol. ?Having long-term (chronic) kidney disease. ?Having a family history of high blood pressure. ?Age. Risk increases with age. ?Race. You may be at higher risk if you are African American. ?Gender. Men are at higher risk than women before age 45. After age 65, women are at higher risk than men. ?Having obstructive sleep apnea. ?Stress. ?What are the signs or symptoms? ?High blood pressure may not cause symptoms. Very high blood pressure (hypertensive crisis) may cause: ?Headache. ?Feelings of worry or nervousness (anxiety). ?Shortness of breath. ?Nosebleed. ?A feeling of being sick to your stomach (nausea). ?Throwing up (vomiting). ?Changes in how you see. ?Very bad chest pain. ?Seizures. ?How is this treated? ?This condition is treated by making healthy lifestyle changes, such as: ?Eating healthy foods. ?Exercising more. ?Drinking less alcohol. ?Your health care provider may prescribe medicine if lifestyle changes are not enough to get your blood pressure under control, and if: ?Your top number is above 130. ?Your bottom number is above 80. ?Your personal target blood pressure may vary. ?Follow  these instructions at home: ?Eating and drinking ? ?If told, follow the DASH eating plan. To follow this plan: ?Fill one half of your plate at each meal with fruits and vegetables. ?Fill one fourth of your plate at each meal with whole grains. Whole grains include whole-wheat pasta, brown rice, and whole-grain bread. ?Eat or drink low-fat dairy products, such as skim milk or low-fat yogurt. ?Fill one fourth of your plate at each meal with low-fat (lean) proteins. Low-fat proteins include fish, chicken without skin, eggs, beans, and tofu. ?Avoid fatty meat, cured and processed meat, or chicken with skin. ?Avoid pre-made or processed food. ?Eat less than 1,500 mg of salt each day. ?Do not drink alcohol if: ?Your doctor tells you not to drink. ?You are pregnant, may be pregnant, or are planning to become pregnant. ?If you drink alcohol: ?Limit how much you use to: ?0-1 drink a day for women. ?0-2 drinks a day for men. ?Be aware of how much alcohol is in your drink. In the [place] one drink equals one 12 oz bottle of beer (355 mL), one 5 oz glass of wine (148 mL), or one 1? oz glass of hard liquor (44 mL). ?Lifestyle ? ?Work with your doctor to stay at a healthy weight or to lose weight. Ask your doctor what the best weight is for you. ?Get at least 30 minutes of exercise most days of the week. This may include walking, swimming, or biking. ?Get at least 30 minutes of exercise that strengthens your muscles (resistance exercise) at least 3 days a week. This may include lifting weights or doing Pilates. ?Do not use any products that contain nicotine or tobacco, such   as cigarettes, e-cigarettes, and chewing tobacco. If you need help quitting, ask your doctor. ?Check your blood pressure at home as told by your doctor. ?Keep all follow-up visits as told by your doctor. This is important. ?Medicines ?Take over-the-counter and prescription medicines only as told by your doctor. Follow directions carefully. ?Do not skip doses of  blood pressure medicine. The medicine does not work as well if you skip doses. Skipping doses also puts you at risk for problems. ?Ask your doctor about side effects or reactions to medicines that you should watch for. ?Contact a doctor if you: ?Think you are having a reaction to the medicine you are taking. ?Have headaches that keep coming back (recurring). ?Feel dizzy. ?Have swelling in your ankles. ?Have trouble with your vision. ?Get help right away if you: ?Get a very bad headache. ?Start to feel mixed up (confused). ?Feel weak or numb. ?Feel faint. ?Have very bad pain in your: ?Chest. ?Belly (abdomen). ?Throw up more than once. ?Have trouble breathing. ?Summary ?Hypertension is another name for high blood pressure. ?High blood pressure forces your heart to work harder to pump blood. ?For most people, a normal blood pressure is less than 120/80. ?Making healthy choices can help lower blood pressure. If your blood pressure does not get lower with healthy choices, you may need to take medicine. ?This information is not intended to replace advice given to you by your health care provider. Make sure you discuss any questions you have with your health care provider. ?Document Revised: 08/17/2018 Document Reviewed: 08/17/2018 ?Elsevier Patient Education ? 2022 [employer] ? ?

## 2021-11-11 NOTE — Assessment & Plan Note (Signed)
Completed transition of care visit/hospital discharge instructions and medication reconciliation .  Patient verbalized understanding.  Follow-up as needed

## 2021-11-11 NOTE — Assessment & Plan Note (Addendum)
Increase Diovan from 160 mg tablet daily to 320 mg tablet by mouth daily.  Keep a daily blood pressure log for 1 week call in the results.  Follow-up in 2 weeks.  Patient verbalized understanding.  Rx for new dose sent to pharmacy.   Advised patient to use a Tylenol for headache but with elevated blood pressure can lead to headaches.  The focus is to control blood pressure and follow-up.

## 2021-11-11 NOTE — Progress Notes (Signed)
Established Patient Office Visit  Subjective:  Patient ID: [name], female    DOB: 11-15-1951  Age: 70 y.o. MRN: [id number]  CC:  Chief Complaint  Patient presents with   Transitions Of Care   Hypertension    HPI [name] presents for Today's visit was for Transitional Care Management.  The patient was discharged from [facility] on 11/04/2021 with a primary diagnosis of TIA, headache.   Contact with the patient and/or caregiver, by a clinical staff member, was made on 11/06/2021 and was documented as a telephone encounter within the EMR.  Through chart review and discussion with the patient I have determined that management of their condition is of moderate complexity.   Patent  presents for follow up of hypertension. Patient was diagnosed in 01/08/2016. The patient is tolerating the medication well without side effects. Compliance with treatment has been good; including taking medication as directed , maintains a healthy diet and regular exercise regimen , and following up as directed.  Lately blood pressure has been elevated.  Given with patient being compliant with medication.     Past Medical History:  Diagnosis Date   Arthritis    "fingers, some in my knees" (01/28/2016)   CAD (coronary artery disease), per cath 04/27/13, non obstructive disease, EF 65-70% 05/11/2013   Carotid disease, bilateral ([place]) 09/20/2013   Lt carotid 50-69% stentosis, less on rt   Dysrhythmia    Fatty liver    GERD (gastroesophageal reflux disease)    Glaucoma    H/O cardiovascular stress test 12/27/2009   negative for ischemia   Headache    "occasional since eye pressure regulated" (01/28/2016)   Hyperlipidemia    Hypertension    Leukocytosis 07/15/2015   Migraine    "stopped w/laser holes to relieve pressure in my eyes; had them 3-4 times/wk before taht" (01/28/2016)   Mitral regurgitation,2+ by cath 05/11/2013   NSTEMI (non-ST elevated myocardial infarction) ([name]) 04/27/2013   Pain     BOTH KNEES - PT HAS TORN MENISCUS LEFT KNEE   Paroxysmal atrial fibrillation (HCC)    hx of a fib on ASA  AND ELIQUIS    Polycythemia vera ([place]) 08/20/2015   JAK-2 positive 07/19/15   PVD (peripheral vascular disease) with claudication ([place]) 07/21/2006   previous L ext iliac stent and rt SFA stent, known occluded Lt SFA   S/P cardiac cath 04/27/2013   NON OBSTRUCTIVE DISEASE, 2+ mr, ef 65-70%   Statin intolerance    Stroke ([place]) 12/21/2004   SWELLING ALL OVER AND RT SIDE OF FACE DRAWN AND SPEECH SLURRED AND NUMBNESS ON RIGHT SIDE-- ALL RESOLVED   TIA (transient ischemic attack)     Past Surgical History:  Procedure Laterality Date   AMPUTATION Right 05/21/2017   Procedure: AMPUTATION ABOVE KNEE;  Surgeon: Rosetta Posner, MD;  Location: [place] [facility];  Service: Vascular;  Laterality: Right;   CARDIAC CATHETERIZATION  04/27/13   non occlusive disease with mild to mod. calcified lesions in ostial LM and proximal LAD and moderate prox. RC AND DISTAL AV GROOVE LCX, ef   CATARACT EXTRACTION W/PHACO Right 03/21/2015   Procedure: CATARACT EXTRACTION PHACO AND INTRAOCULAR LENS PLACEMENT RIGHT EYE;  Surgeon: Baruch Goldmann, MD;  [facility];  Service: Ophthalmology;  Laterality: Right;  CDE:5.10   CATARACT EXTRACTION W/PHACO Left 05/16/2015   Procedure: CATARACT EXTRACTION PHACO AND INTRAOCULAR LENS PLACEMENT (IOC);  Surgeon: Baruch Goldmann, MD;  Location: [facility];  Service: Ophthalmology;  Laterality: Left;  CDE 5.57   CESAREAN SECTION  1977   COLONOSCOPY N/A 09/11/2016   Procedure: COLONOSCOPY;  Surgeon: Rogene [place], MD;  [facility];  Service: Endoscopy;  Laterality: N/A;  730-moved to 1:00 Ann notified pt   EMBOLECTOMY Right 02/01/2016   Procedure: Thrombectomy of Right Femoral-Popliteal Bypass Graft; Endarterectomy of Right Below Knee Popliteal Artery and Tibial Peroneal Trunk with Bovine Pericardium Patch Angioplasty.;  Surgeon: Angelia Mould, MD;  Location: [place];  Service:  Vascular;  Laterality: Right;   FEMORAL-POPLITEAL BYPASS GRAFT Right 01/31/2016   Procedure: BYPASS GRAFT RIGHT COMMON  FEMORALTO BELOW KNEE POPLITEAL ARTERY BYPASS GRAFT USING 6MM PROPATEN GORTEX GRAFT;  Surgeon: Mal Misty, MD;  Location: [place];  Service: Vascular;  Laterality: Right;   FEMORAL-POPLITEAL BYPASS GRAFT Right 01/09/2017   Procedure: THROMBECTOMY OF RIGHT FEMORAL-POPLITEAL  ARTERY BYPASS GRAFT; THROMBECTOMY RIGHT  TIBIAL VESSELS;  Surgeon: [name], MD;  Location: [employer];  Service: Vascular;  Laterality: Right;   FEMORAL-POPLITEAL BYPASS GRAFT Right 05/17/2017   Procedure: RIGHT FEMORAL-TIBIAL PERONEAL TRUNK ARTERY BYPASS GRAFT USING 39mX80cm PROPATEN GRAFT WITH REMOVABLE RING;  Surgeon: ERosetta Posner MD;  [place]  Service: Vascular;  Laterality: Right;   FRACTURE SURGERY     ILIAC ARTERY STENT  07/2006   external iliac and Rt SFA stent 07/2006 AND THE LEFT WAS IN 2006   KNEE ARTHROSCOPY WITH MEDIAL MENISECTOMY Left 03/27/2014   Procedure: left knee arthorscopy with medial chondraplasty of the medial femoral and patella, medial microfracture technique of medial femoral condyl;  Surgeon: RTobi Bastos MD;  [facility];  Service: Orthopedics;  Laterality: Left;   LEFT HEART CATHETERIZATION WITH CORONARY ANGIOGRAM Right 04/27/2013   Procedure: LEFT HEART CATHETERIZATION WITH CORONARY ANGIOGRAM;  Surgeon: DLeonie Man MD;  [facility];  Service: Cardiovascular;  Laterality: Right;   LOWER EXTREMITY ANGIOGRAM Right 01/31/2016   Procedure: INTRAOP RIGHT LOWER EXTREMITY ANGIOGRAM;  Surgeon: JMal Misty MD;  [place]  Service: Vascular;  Laterality: Right;   ORIF WRIST FRACTURE Right 1983   OVARIAN CYST REMOVAL Right    PATCH ANGIOPLASTY Right 01/31/2016   Procedure: RIGHT COMMON FEMORAL AND PROFUNDA FEMORIS ENDARECTOMY WITH PATCH ANGIOPLASTY;  Surgeon: JMal Misty MD;  [place]  Service: Vascular;  Laterality: Right;   PATCH  ANGIOPLASTY Right 01/09/2017   Procedure: PATCH ANGIOPLASTY RIGHT POPLITEAL ARTERY BYPASS GRAFT;  Surgeon: CElam Dutch MD;  [place]  Service: Vascular;  Laterality: Right;   PERIPHERAL VASCULAR CATHETERIZATION N/A 06/27/2015   Procedure: Lower Extremity Angiography;  Surgeon: JLorretta Harp MD;  [facility];  Service: Cardiovascular;  Laterality: N/A;   PERIPHERAL VASCULAR CATHETERIZATION Bilateral 01/29/2016   Procedure: Lower Extremity Angiography;  Surgeon: MWellington [place] MD;  [facility];  Service: Cardiovascular;  Laterality: Bilateral;   PERIPHERAL VASCULAR CATHETERIZATION N/A 01/29/2016   Procedure: Abdominal Aortogram;  Surgeon: MWellington [place] MD;  [facility];  Service: Cardiovascular;  Laterality: N/A;   REFRACTIVE SURGERY Bilateral    "6 in one eye; 7 in the other; to relieve pressure; not glaucoma" (01/28/2016)   SFA Right 06/27/2015   overlapping Bahn covered stents   TAlbertLeft 05/17/2017   Procedure: LEFT LEG GREATER SAPHENOUS VEIN HARVEST;  Surgeon: ERosetta Posner MD;  [place]  Service: Vascular;  Laterality: Left;   VEIN REPAIR  Right 01/31/2016   Procedure: RIGHT GREATER SAPHENOUS VEIN EXAMNED BUT NOT REMOVED;  Surgeon: Mal Misty, MD;  Location: [facility] OR;  Service: Vascular;  Laterality: Right;    Family History  Problem Relation Age of Onset   CAD Mother    Lung cancer Mother    Heart attack Mother    CAD Father    Heart disease Father    Heart attack Father 21       died in hes 31's with heart disease   Healthy Sister    Liver cancer Brother    Healthy Sister    CAD Brother        has had CABG   CAD Brother     Social History   Socioeconomic History   Marital status: Widowed    Spouse name: Not on file   Number of children: 2   Years of education: 67   Highest education level: Not on file  Occupational History   Occupation: CNA  Tobacco Use    Smoking status: Former    Packs/day: 1.00    Years: 45.00    Pack years: 45.00    Types: Cigarettes    Quit date: 11/11/2012    Years since quitting: 9.0   Smokeless tobacco: Never   Tobacco comments:    quit 4 years ago.    10/27/21-Pt declines lung ca screen at this time.   Vaping Use   Vaping Use: Never used  Substance and Sexual Activity   Alcohol use: No    Alcohol/week: 0.0 standard drinks   Drug use: No   Sexual activity: Not Currently    Birth control/protection: None  Other Topics Concern   Not on file  Social History Narrative   Husband passed away after CVA in February 27, 2005.   2 children, 5 grandchildren and 1 great grandchild.   Social Determinants of Health   Financial Resource Strain: Low Risk    Difficulty of Paying Living Expenses: Not hard at all  Food Insecurity: No Food Insecurity   Worried About Charity fundraiser in the Last Year: Never true   [place] in the Last Year: Never true  Transportation Needs: No Transportation Needs   Lack of Transportation (Medical): No   Lack of Transportation (Non-Medical): No  Physical Activity: Insufficiently Active   Days of Exercise per Week: 3 days   Minutes of Exercise per Session: 30 min  Stress: No Stress Concern Present   Feeling of Stress : Not at all  Social Connections: Moderately Integrated   Frequency of Communication with Friends and Family: More than three times a week   Frequency of Social Gatherings with Friends and Family: More than three times a week   Attends Religious Services: 1 to 4 times per year   Active Member of [employer] or Organizations: Yes   Attends Archivist Meetings: 1 to 4 times per year   Marital Status: Widowed  Human resources officer Violence: Not At Risk   Fear of Current or Ex-Partner: No   Emotionally Abused: No   Physically Abused: No   Sexually Abused: No    Outpatient Medications Prior to Visit  Medication Sig Dispense Refill   acetaminophen (TYLENOL) 500 MG tablet  Take 2 tablets (1,000 mg total) by mouth every 6 (six) hours. 30 tablet 0   ARTIFICIAL TEAR OP Place 1 drop into both eyes as needed (for dry eyes).     aspirin EC 325 MG tablet Take 325 mg  by mouth daily.     calcium-vitamin D (OSCAL WITH D) 500-200 MG-UNIT tablet Take 1 tablet by mouth daily with breakfast. 90 tablet 1   diclofenac Sodium (VOLTAREN) 1 % GEL Apply 2 g topically 4 (four) times daily. 50 g 0   diltiazem (CARDIZEM CD) 240 MG 24 hr capsule TAKE 1 CAPSULE EVERY MORNING (Patient taking differently: Take 240 mg by mouth daily.) 90 capsule 0   diphenhydrAMINE (BENADRYL) 25 MG tablet Take 25 mg by mouth every 6 (six) hours as needed.     ELIQUIS 5 MG TABS tablet TAKE ONE TABLET TWICE DAILY 60 tablet 2   escitalopram (LEXAPRO) 20 MG tablet Take 1 tablet (20 mg total) by mouth daily. 30 tablet 1   ezetimibe (ZETIA) 10 MG tablet Take 1 tablet (10 mg total) by mouth daily. 30 tablet 1   fenofibrate (TRICOR) 145 MG tablet Take 1 tablet (145 mg total) by mouth daily. (NEEDS TO BE SEEN BEFORE NEXT REFILL) 30 tablet 0   flecainide (TAMBOCOR) 50 MG tablet TAKE 1 TABLET 2 TIMES A DAY 60 tablet 1   hydroxyurea (HYDREA) 500 MG capsule Take 1 capsule (500 mg total) by mouth daily. Take one capsule daily except Sundays. May take with food to minimize GI side effects. 30 capsule 3   omeprazole (PRILOSEC) 20 MG capsule TAKE 1 CAPSULE 2 TIMES A DAY BEFORE A MEAL (Patient taking differently: Take 20 mg by mouth 2 (two) times daily before a meal.) 180 capsule 0   traMADol (ULTRAM) 50 MG tablet Take 50 mg by mouth daily as needed for moderate pain.     chlorthalidone (HYGROTON) 25 MG tablet Take 1 tablet (25 mg total) by mouth daily. 30 tablet 5   valsartan (DIOVAN) 160 MG tablet TAKE ONE TABLET ONCE DAILY (Patient taking differently: Take 160 mg by mouth daily.) 60 tablet 4   No facility-administered medications prior to visit.    Allergies  Allergen Reactions   Atenolol Rash and Itching   Demerol   [Meperidine Hcl] Itching   Demerol [Meperidine] Other (See Comments) and Itching    Unknown, can't remember  Unknown, can't remember    Gabapentin Anxiety and Other (See Comments)    SI and HI   Inderal [Propranolol] Other (See Comments) and Itching    Doesn't remember    Prednisone Other (See Comments)    Muscle spasms   Statins Itching and Other (See Comments)    Simvastatin-caused severe itching Pravastatin-caused lesser tiching One type of statin? Caused stroke in 2006, and other symptoms as result   Warfarin And Related Other (See Comments)    Caused nose bleeds   Crestor [Rosuvastatin] Itching and Rash   Docosahexaenoic Acid (Dha)     Other reaction(s): Other (See Comments) nosebleeds   Lactose Intolerance (Gi) Other (See Comments)    Bloating, gas   Lipitor [Atorvastatin] Rash   Lovaza [Omega-3-Acid Ethyl Esters] Other (See Comments)    nosebleeds    Penicillins Hives, Rash and Other (See Comments)    Has patient had a PCN reaction causing immediate rash, facial/tongue/throat swelling, SOB or lightheadedness with hypotension: Yes Has patient had a PCN reaction causing severe rash involving mucus membranes or skin necrosis: No Has patient had a PCN reaction that required hospitalization: No Has patient had a PCN reaction occurring within the last 10 years: No If all of the above answers are "NO", then may proceed with Cephalosporin use.  Questionable high fever    Pravastatin Itching and Rash  Simvastatin Itching, Rash and Other (See Comments)   Trilipix [Choline Fenofibrate] Other (See Comments)    Doesn't remember    Warfarin Other (See Comments)    nosebleeds   Hydralazine Swelling   Effexor [Venlafaxine Hcl] Other (See Comments)    Doesn't remember    Nexium [Esomeprazole Magnesium] Rash   Percocet [Oxycodone-Acetaminophen] Other (See Comments)    Doesn't remember    Plavix [Clopidogrel Bisulfate] Rash    ROS Review of Systems  Constitutional: Negative.    HENT: Negative.    Respiratory: Negative.    Cardiovascular:  Negative for chest pain, palpitations and leg swelling.  Gastrointestinal: Negative.   Genitourinary: Negative.   Skin:  Negative for rash.  Neurological:  Positive for headaches.  All other systems reviewed and are negative.    Objective:    Physical Exam Vitals and nursing note reviewed.  Constitutional:      Appearance: Normal appearance.  HENT:     Head: Normocephalic.     Right Ear: External ear normal.     Left Ear: External ear normal.     Mouth/Throat:     Mouth: Mucous membranes are moist.     Pharynx: Oropharynx is clear.  Eyes:     Conjunctiva/sclera: Conjunctivae normal.  Cardiovascular:     Rate and Rhythm: Normal rate and regular rhythm.     Pulses: Normal pulses.     Heart sounds: Normal heart sounds.  Pulmonary:     Effort: Pulmonary effort is normal.     Breath sounds: Normal breath sounds.  Abdominal:     General: Bowel sounds are normal.  Skin:    General: Skin is warm.     Findings: No rash.  Neurological:     Mental Status: She is alert and oriented to person, place, and time.  Psychiatric:        Behavior: Behavior normal.    BP (!) 185/70   Pulse 70   Ht 5' 4.5" (1.638 m)   Wt 138 lb (62.6 kg)   SpO2 96%   BMI 23.32 kg/m  Wt Readings from Last 3 Encounters:  11/11/21 138 lb (62.6 kg)  11/03/21 137 lb 5.6 oz (62.3 kg)  10/29/21 137 lb 5.8 oz (62.3 kg)     There are no preventive care reminders to display for this patient.  There are no preventive care reminders to display for this patient.  Lab Results  Component Value Date   TSH 1.550 06/28/2020   Lab Results  Component Value Date   WBC 11.9 (H) 11/04/2021   HGB 11.4 (L) 11/04/2021   HCT 39.8 11/04/2021   MCV 80.7 11/04/2021   PLT 293 11/04/2021   Lab Results  Component Value Date   NA 134 (L) 11/04/2021   K 4.9 11/04/2021   CO2 20 (L) 11/04/2021   GLUCOSE 91 11/04/2021   BUN 29 (H) 11/04/2021    CREATININE 1.09 (H) 11/04/2021   BILITOT 0.6 11/04/2021   ALKPHOS 57 11/04/2021   AST 27 11/04/2021   ALT 16 11/04/2021   PROT 5.8 (L) 11/04/2021   ALBUMIN 2.8 (L) 11/04/2021   CALCIUM 7.9 (L) 11/04/2021   ANIONGAP 8 11/04/2021   EGFR 57 (L) 10/21/2021   Lab Results  Component Value Date   CHOL 173 11/04/2021   Lab Results  Component Value Date   HDL 35 (L) 11/04/2021   Lab Results  Component Value Date   LDLCALC 99 11/04/2021   Lab Results  Component Value Date  TRIG 193 (H) 11/04/2021   Lab Results  Component Value Date   CHOLHDL 4.9 11/04/2021   Lab Results  Component Value Date   HGBA1C 4.9 11/04/2021      Assessment & Plan:   Problem List Items Addressed This Visit       Cardiovascular and Mediastinum   Essential hypertension - Primary    Increase Diovan from 160 mg tablet daily to 320 mg tablet by mouth daily.  Keep a daily blood pressure log for 1 week call in the results.  Follow-up in 2 weeks.  Patient verbalized understanding.  Rx for new dose sent to pharmacy.   Advised patient to use a Tylenol for headache but with elevated blood pressure can lead to headaches.  The focus is to control blood pressure and follow-up.      Relevant Medications   valsartan (DIOVAN) 160 MG tablet     Other   Encounter for support and coordination of transition of care    Completed transition of care visit/hospital discharge instructions and medication reconciliation .  Patient verbalized understanding.  Follow-up as needed       Meds ordered this encounter  Medications   valsartan (DIOVAN) 160 MG tablet    Sig: Take 1 tablet (160 mg total) by mouth 2 (two) times daily.    Dispense:  90 tablet    Refill:  3    Order Specific Question:   Supervising Provider    Answer:   Claretta Fraise [phone]     Follow-up: Return in about 2 weeks (around 11/25/2021).    Ivy Lynn, NP

## 2021-11-12 ENCOUNTER — Ambulatory Visit: Payer: 59 | Admitting: Vascular Surgery

## 2021-11-18 ENCOUNTER — Other Ambulatory Visit: Payer: Self-pay | Admitting: Nurse Practitioner

## 2021-11-19 ENCOUNTER — Ambulatory Visit: Payer: 59 | Admitting: Vascular Surgery

## 2021-11-25 ENCOUNTER — Encounter: Payer: Self-pay | Admitting: Nurse Practitioner

## 2021-11-25 ENCOUNTER — Ambulatory Visit (INDEPENDENT_AMBULATORY_CARE_PROVIDER_SITE_OTHER): Payer: 59 | Admitting: Nurse Practitioner

## 2021-11-25 DIAGNOSIS — I10 Essential (primary) hypertension: Secondary | ICD-10-CM

## 2021-11-25 NOTE — Patient Instructions (Signed)
Hypertension, Adult ?Hypertension is another name for high blood pressure. High blood pressure forces your heart to work harder to pump blood. This can cause problems over time. ?There are two numbers in a blood pressure reading. There is a top number (systolic) over a bottom number (diastolic). It is best to have a blood pressure that is below 120/80. Healthy choices can help lower your blood pressure, or you may need medicine to help lower it. ?What are the causes? ?The cause of this condition is not known. Some conditions may be related to high blood pressure. ?What increases the risk? ?Smoking. ?Having type 2 diabetes mellitus, high cholesterol, or both. ?Not getting enough exercise or physical activity. ?Being overweight. ?Having too much fat, sugar, calories, or salt (sodium) in your diet. ?Drinking too much alcohol. ?Having long-term (chronic) kidney disease. ?Having a family history of high blood pressure. ?Age. Risk increases with age. ?Race. You may be at higher risk if you are African American. ?Gender. Men are at higher risk than women before age 45. After age 65, women are at higher risk than men. ?Having obstructive sleep apnea. ?Stress. ?What are the signs or symptoms? ?High blood pressure may not cause symptoms. Very high blood pressure (hypertensive crisis) may cause: ?Headache. ?Feelings of worry or nervousness (anxiety). ?Shortness of breath. ?Nosebleed. ?A feeling of being sick to your stomach (nausea). ?Throwing up (vomiting). ?Changes in how you see. ?Very bad chest pain. ?Seizures. ?How is this treated? ?This condition is treated by making healthy lifestyle changes, such as: ?Eating healthy foods. ?Exercising more. ?Drinking less alcohol. ?Your health care provider may prescribe medicine if lifestyle changes are not enough to get your blood pressure under control, and if: ?Your top number is above 130. ?Your bottom number is above 80. ?Your personal target blood pressure may vary. ?Follow  these instructions at home: ?Eating and drinking ? ?If told, follow the DASH eating plan. To follow this plan: ?Fill one half of your plate at each meal with fruits and vegetables. ?Fill one fourth of your plate at each meal with whole grains. Whole grains include whole-wheat pasta, brown rice, and whole-grain bread. ?Eat or drink low-fat dairy products, such as skim milk or low-fat yogurt. ?Fill one fourth of your plate at each meal with low-fat (lean) proteins. Low-fat proteins include fish, chicken without skin, eggs, beans, and tofu. ?Avoid fatty meat, cured and processed meat, or chicken with skin. ?Avoid pre-made or processed food. ?Eat less than 1,500 mg of salt each day. ?Do not drink alcohol if: ?Your doctor tells you not to drink. ?You are pregnant, may be pregnant, or are planning to become pregnant. ?If you drink alcohol: ?Limit how much you use to: ?0-1 drink a day for women. ?0-2 drinks a day for men. ?Be aware of how much alcohol is in your drink. In the [place] one drink equals one 12 oz bottle of beer (355 mL), one 5 oz glass of wine (148 mL), or one 1? oz glass of hard liquor (44 mL). ?Lifestyle ? ?Work with your doctor to stay at a healthy weight or to lose weight. Ask your doctor what the best weight is for you. ?Get at least 30 minutes of exercise most days of the week. This may include walking, swimming, or biking. ?Get at least 30 minutes of exercise that strengthens your muscles (resistance exercise) at least 3 days a week. This may include lifting weights or doing Pilates. ?Do not use any products that contain nicotine or tobacco, such   as cigarettes, e-cigarettes, and chewing tobacco. If you need help quitting, ask your doctor. ?Check your blood pressure at home as told by your doctor. ?Keep all follow-up visits as told by your doctor. This is important. ?Medicines ?Take over-the-counter and prescription medicines only as told by your doctor. Follow directions carefully. ?Do not skip doses of  blood pressure medicine. The medicine does not work as well if you skip doses. Skipping doses also puts you at risk for problems. ?Ask your doctor about side effects or reactions to medicines that you should watch for. ?Contact a doctor if you: ?Think you are having a reaction to the medicine you are taking. ?Have headaches that keep coming back (recurring). ?Feel dizzy. ?Have swelling in your ankles. ?Have trouble with your vision. ?Get help right away if you: ?Get a very bad headache. ?Start to feel mixed up (confused). ?Feel weak or numb. ?Feel faint. ?Have very bad pain in your: ?Chest. ?Belly (abdomen). ?Throw up more than once. ?Have trouble breathing. ?Summary ?Hypertension is another name for high blood pressure. ?High blood pressure forces your heart to work harder to pump blood. ?For most people, a normal blood pressure is less than 120/80. ?Making healthy choices can help lower blood pressure. If your blood pressure does not get lower with healthy choices, you may need to take medicine. ?This information is not intended to replace advice given to you by your health care provider. Make sure you discuss any questions you have with your health care provider. ?Document Revised: 08/17/2018 Document Reviewed: 08/17/2018 ?Elsevier Patient Education ? 2022 [employer] ? ?

## 2021-11-25 NOTE — Progress Notes (Signed)
Established Patient Office Visit  Subjective:  Patient ID: [name], female    DOB: 07/24/1951  Age: 70 y.o. MRN: [id number]  CC:  Chief Complaint  Patient presents with   Hypertension    2 week follow up    HPI [name] presents for Hypertension, follow-up  BP Readings from Last 3 Encounters:  11/25/21 (!) 183/54  11/11/21 (!) 185/70  11/04/21 (!) 142/86   Wt Readings from Last 3 Encounters:  11/25/21 134 lb 6.4 oz (61 kg)  11/11/21 138 lb (62.6 kg)  11/03/21 137 lb 5.6 oz (62.3 kg)     She was last seen for hypertension 2 weeks ago.  BP at that visit was 185/70. Management since that visit includes diltiazem 240 mg capsule every 24 hours,   She reports good compliance with treatment. She is not having side effects.  She is following a Low Sodium diet. She is not exercising. She does not smoke.  Use of agents associated with hypertension: none.   Outside blood pressures are patient did not bring blood pressure log to clinic today.. Symptoms: No chest pain No chest pressure  No palpitations No syncope  No dyspnea No orthopnea  No paroxysmal nocturnal dyspnea No lower extremity edema   Pertinent labs: Lab Results  Component Value Date   CHOL 173 11/04/2021   HDL 35 (L) 11/04/2021   LDLCALC 99 11/04/2021   TRIG 193 (H) 11/04/2021   CHOLHDL 4.9 11/04/2021   Lab Results  Component Value Date   NA 134 (L) 11/04/2021   K 4.9 11/04/2021   CREATININE 1.09 (H) 11/04/2021   GFRNONAA 55 (L) 11/04/2021   GLUCOSE 91 11/04/2021   TSH 1.550 06/28/2020     The ASCVD Risk score (Arnett DK, et al., 2019) failed to calculate for the following reasons:   The patient has a prior MI or stroke diagnosis     Past Medical History:  Diagnosis Date   Arthritis    "fingers, some in my knees" (01/28/2016)   CAD (coronary artery disease), per cath 04/27/13, non obstructive disease, EF 65-70% 05/11/2013   Carotid disease, bilateral ([place]) 09/20/2013   Lt carotid  50-69% stentosis, less on rt   Dysrhythmia    Fatty liver    GERD (gastroesophageal reflux disease)    Glaucoma    H/O cardiovascular stress test 12/27/2009   negative for ischemia   Headache    "occasional since eye pressure regulated" (01/28/2016)   Hyperlipidemia    Hypertension    Leukocytosis 07/15/2015   Migraine    "stopped w/laser holes to relieve pressure in my eyes; had them 3-4 times/wk before taht" (01/28/2016)   Mitral regurgitation,2+ by cath 05/11/2013   NSTEMI (non-ST elevated myocardial infarction) ([place]) 04/27/2013   Pain    BOTH KNEES - PT HAS TORN MENISCUS LEFT KNEE   Paroxysmal atrial fibrillation (HCC)    hx of a fib on ASA  AND ELIQUIS    Polycythemia vera ([place]) 08/20/2015   JAK-2 positive 07/19/15   PVD (peripheral vascular disease) with claudication ([place]) 07/21/2006   previous L ext iliac stent and rt SFA stent, known occluded Lt SFA   S/P cardiac cath 04/27/2013   NON OBSTRUCTIVE DISEASE, 2+ mr, ef 65-70%   Statin intolerance    Stroke ([place]) 12/21/2004   SWELLING ALL OVER AND RT SIDE OF FACE DRAWN AND SPEECH SLURRED AND NUMBNESS ON RIGHT SIDE-- ALL RESOLVED   TIA (transient ischemic attack)  Past Surgical History:  Procedure Laterality Date   AMPUTATION Right 05/21/2017   Procedure: AMPUTATION ABOVE KNEE;  Surgeon: Rosetta Posner, MD;  [facility];  Service: Vascular;  Laterality: Right;   CARDIAC CATHETERIZATION  04/27/13   non occlusive disease with mild to mod. calcified lesions in ostial LM and proximal LAD and moderate prox. RC AND DISTAL AV GROOVE LCX, ef   CATARACT EXTRACTION W/PHACO Right 03/21/2015   Procedure: CATARACT EXTRACTION PHACO AND INTRAOCULAR LENS PLACEMENT RIGHT EYE;  Surgeon: Baruch Goldmann, MD;  Location: [facility];  Service: Ophthalmology;  Laterality: Right;  CDE:5.10   CATARACT EXTRACTION W/PHACO Left 05/16/2015   Procedure: CATARACT EXTRACTION PHACO AND INTRAOCULAR LENS PLACEMENT (IOC);  Surgeon: Baruch Goldmann, MD;  Location: [facility];   Service: Ophthalmology;  Laterality: Left;  CDE 5.57   CESAREAN SECTION  1977   COLONOSCOPY N/A 09/11/2016   Procedure: COLONOSCOPY;  Surgeon: Rogene Houston, MD;  [facility];  Service: Endoscopy;  Laterality: N/A;  730-moved to 1:00 Ann notified pt   EMBOLECTOMY Right 02/01/2016   Procedure: Thrombectomy of Right Femoral-Popliteal Bypass Graft; Endarterectomy of Right Below Knee Popliteal Artery and Tibial Peroneal Trunk with Bovine Pericardium Patch Angioplasty.;  Surgeon: Angelia Mould, MD;  Location: [facility];  Service: Vascular;  Laterality: Right;   FEMORAL-POPLITEAL BYPASS GRAFT Right 01/31/2016   Procedure: BYPASS GRAFT RIGHT COMMON  FEMORALTO BELOW KNEE POPLITEAL ARTERY BYPASS GRAFT USING 6MM PROPATEN GORTEX GRAFT;  Surgeon: Mal Misty, MD;  Location: [facility];  Service: Vascular;  Laterality: Right;   FEMORAL-POPLITEAL BYPASS GRAFT Right 01/09/2017   Procedure: THROMBECTOMY OF RIGHT FEMORAL-POPLITEAL  ARTERY BYPASS GRAFT; THROMBECTOMY RIGHT  TIBIAL VESSELS;  Surgeon: [name], MD;  Location: [facility];  Service: Vascular;  Laterality: Right;   FEMORAL-POPLITEAL BYPASS GRAFT Right 05/17/2017   Procedure: RIGHT FEMORAL-TIBIAL PERONEAL TRUNK ARTERY BYPASS GRAFT USING 6mX80cm PROPATEN GRAFT WITH REMOVABLE RING;  Surgeon: ERosetta Posner MD;  [place] [facility]  Service: Vascular;  Laterality: Right;   FRACTURE SURGERY     ILIAC ARTERY STENT  07/2006   external iliac and Rt SFA stent 07/2006 AND THE LEFT WAS IN 2006   KNEE ARTHROSCOPY WITH MEDIAL MENISECTOMY Left 03/27/2014   Procedure: left knee arthorscopy with medial chondraplasty of the medial femoral and patella, medial microfracture technique of medial femoral condyl;  Surgeon: RTobi Bastos MD;  [facility];  Service: Orthopedics;  Laterality: Left;   LEFT HEART CATHETERIZATION WITH CORONARY ANGIOGRAM Right 04/27/2013   Procedure: LEFT HEART CATHETERIZATION WITH CORONARY ANGIOGRAM;  Surgeon: DLeonie Man MD;   [facility];  Service: Cardiovascular;  Laterality: Right;   LOWER EXTREMITY ANGIOGRAM Right 01/31/2016   Procedure: INTRAOP RIGHT LOWER EXTREMITY ANGIOGRAM;  Surgeon: JMal Misty MD;  [place] [facility]  Service: Vascular;  Laterality: Right;   ORIF WRIST FRACTURE Right 1983   OVARIAN CYST REMOVAL Right    PATCH ANGIOPLASTY Right 01/31/2016   Procedure: RIGHT COMMON FEMORAL AND PROFUNDA FEMORIS ENDARECTOMY WITH PATCH ANGIOPLASTY;  Surgeon: JMal Misty MD;  [place] [facility]  Service: Vascular;  Laterality: Right;   PATCH ANGIOPLASTY Right 01/09/2017   Procedure: PATCH ANGIOPLASTY RIGHT POPLITEAL ARTERY BYPASS GRAFT;  Surgeon: CElam Dutch MD;  [place] [facility]  Service: Vascular;  Laterality: Right;   PERIPHERAL VASCULAR CATHETERIZATION N/A 06/27/2015   Procedure: Lower Extremity Angiography;  Surgeon: JLorretta Harp MD;  [facility];  Service: Cardiovascular;  Laterality: N/A;   PERIPHERAL VASCULAR CATHETERIZATION Bilateral 01/29/2016   Procedure: Lower Extremity Angiography;  Surgeon: Wellington Hampshire, MD;  Location: [facility] LAB;  Service: Cardiovascular;  Laterality: Bilateral;   PERIPHERAL VASCULAR CATHETERIZATION N/A 01/29/2016   Procedure: Abdominal Aortogram;  Surgeon: Wellington Hampshire, MD;  Location: [facility];  Service: Cardiovascular;  Laterality: N/A;   REFRACTIVE SURGERY Bilateral    "6 in one eye; 7 in the other; to relieve pressure; not glaucoma" (01/28/2016)   SFA Right 06/27/2015   overlapping Bahn covered stents   [place] Left 05/17/2017   Procedure: LEFT LEG GREATER SAPHENOUS VEIN HARVEST;  Surgeon: Rosetta Posner, MD;  [facility];  Service: Vascular;  Laterality: Left;   VEIN REPAIR Right 01/31/2016   Procedure: RIGHT GREATER SAPHENOUS VEIN EXAMNED BUT NOT REMOVED;  Surgeon: Mal Misty, MD;  Location: [facility];  Service: Vascular;  Laterality: Right;    Family History  Problem Relation Age of  Onset   CAD Mother    Lung cancer Mother    Heart attack Mother    CAD Father    Heart disease Father    Heart attack Father 74       died in hes 55's with heart disease   Healthy Sister    Liver cancer Brother    Healthy Sister    CAD Brother        has had CABG   CAD Brother     Social History   Socioeconomic History   Marital status: Widowed    Spouse name: Not on file   Number of children: 2   Years of education: 33   Highest education level: Not on file  Occupational History   Occupation: CNA  Tobacco Use   Smoking status: Former    Packs/day: 1.00    Years: 45.00    Pack years: 45.00    Types: Cigarettes    Quit date: 11/11/2012    Years since quitting: 9.0   Smokeless tobacco: Never   Tobacco comments:    quit 4 years ago.    10/27/21-Pt declines lung ca screen at this time.   Vaping Use   Vaping Use: Never used  Substance and Sexual Activity   Alcohol use: No    Alcohol/week: 0.0 standard drinks   Drug use: No   Sexual activity: Not Currently    Birth control/protection: None  Other Topics Concern   Not on file  Social History Narrative   Husband passed away after CVA in 03-11-05.   2 children, 5 grandchildren and 1 great grandchild.   Social Determinants of Health   Financial Resource Strain: Low Risk    Difficulty of Paying Living Expenses: Not hard at all  Food Insecurity: No Food Insecurity   Worried About Charity fundraiser in the Last Year: Never true   [place] in the Last Year: Never true  Transportation Needs: No Transportation Needs   Lack of Transportation (Medical): No   Lack of Transportation (Non-Medical): No  Physical Activity: Insufficiently Active   Days of Exercise per Week: 3 days   Minutes of Exercise per Session: 30 min  Stress: No Stress Concern Present   Feeling of Stress : Not at all  Social Connections: Moderately Integrated   Frequency of Communication with Friends and Family: More than three times a week    Frequency of Social Gatherings with Friends and Family:  More than three times a week   Attends Religious Services: 1 to 4 times per year   Active Member of Clubs or Organizations: Yes   Attends Archivist Meetings: 1 to 4 times per year   Marital Status: Widowed  Human resources officer Violence: Not At Risk   Fear of Current or Ex-Partner: No   Emotionally Abused: No   Physically Abused: No   Sexually Abused: No    Outpatient Medications Prior to Visit  Medication Sig Dispense Refill   acetaminophen (TYLENOL) 500 MG tablet Take 2 tablets (1,000 mg total) by mouth every 6 (six) hours. 30 tablet 0   ARTIFICIAL TEAR OP Place 1 drop into both eyes as needed (for dry eyes).     aspirin EC 325 MG tablet Take 325 mg by mouth daily.     calcium-vitamin D (OSCAL WITH D) 500-200 MG-UNIT tablet Take 1 tablet by mouth daily with breakfast. 90 tablet 1   diclofenac Sodium (VOLTAREN) 1 % GEL Apply 2 g topically 4 (four) times daily. 50 g 0   diltiazem (CARDIZEM CD) 240 MG 24 hr capsule TAKE 1 CAPSULE EVERY MORNING 90 capsule 0   diphenhydrAMINE (BENADRYL) 25 MG tablet Take 25 mg by mouth every 6 (six) hours as needed.     ELIQUIS 5 MG TABS tablet TAKE ONE TABLET TWICE DAILY 60 tablet 2   escitalopram (LEXAPRO) 20 MG tablet Take 1 tablet (20 mg total) by mouth daily. 30 tablet 1   ezetimibe (ZETIA) 10 MG tablet Take 1 tablet (10 mg total) by mouth daily. 30 tablet 1   fenofibrate (TRICOR) 145 MG tablet Take 1 tablet (145 mg total) by mouth daily. (NEEDS TO BE SEEN BEFORE NEXT REFILL) 30 tablet 0   flecainide (TAMBOCOR) 50 MG tablet TAKE 1 TABLET 2 TIMES A DAY 60 tablet 1   hydroxyurea (HYDREA) 500 MG capsule Take 1 capsule (500 mg total) by mouth daily. Take one capsule daily except Sundays. May take with food to minimize GI side effects. 30 capsule 3   omeprazole (PRILOSEC) 20 MG capsule TAKE 1 CAPSULE 2 TIMES A DAY BEFORE A MEAL (Patient taking differently: Take 20 mg by mouth 2 (two) times daily  before a meal.) 180 capsule 0   traMADol (ULTRAM) 50 MG tablet Take 50 mg by mouth daily as needed for moderate pain.     valsartan (DIOVAN) 160 MG tablet Take 1 tablet (160 mg total) by mouth 2 (two) times daily. 90 tablet 3   No facility-administered medications prior to visit.    Allergies  Allergen Reactions   Atenolol Rash and Itching   Demerol  [Meperidine Hcl] Itching   Demerol [Meperidine] Other (See Comments) and Itching    Unknown, can't remember  Unknown, can't remember    Gabapentin Anxiety and Other (See Comments)    SI and HI   Inderal [Propranolol] Other (See Comments) and Itching    Doesn't remember    Prednisone Other (See Comments)    Muscle spasms   Statins Itching and Other (See Comments)    Simvastatin-caused severe itching Pravastatin-caused lesser tiching One type of statin? Caused stroke in 2006, and other symptoms as result   Warfarin And Related Other (See Comments)    Caused nose bleeds   Crestor [Rosuvastatin] Itching and Rash   Docosahexaenoic Acid (Dha)     Other reaction(s): Other (See Comments) nosebleeds   Lactose Intolerance (Gi) Other (See Comments)    Bloating, gas   Lipitor [Atorvastatin] Rash  Lovaza [Omega-3-Acid Ethyl Esters] Other (See Comments)    nosebleeds    Penicillins Hives, Rash and Other (See Comments)    Has patient had a PCN reaction causing immediate rash, facial/tongue/throat swelling, SOB or lightheadedness with hypotension: Yes Has patient had a PCN reaction causing severe rash involving mucus membranes or skin necrosis: No Has patient had a PCN reaction that required hospitalization: No Has patient had a PCN reaction occurring within the last 10 years: No If all of the above answers are "NO", then may proceed with Cephalosporin use.  Questionable high fever    Pravastatin Itching and Rash   Simvastatin Itching, Rash and Other (See Comments)   Trilipix [Choline Fenofibrate] Other (See Comments)    Doesn't remember     Warfarin Other (See Comments)    nosebleeds   Hydralazine Swelling   Effexor [Venlafaxine Hcl] Other (See Comments)    Doesn't remember    Nexium [Esomeprazole Magnesium] Rash   Percocet [Oxycodone-Acetaminophen] Other (See Comments)    Doesn't remember    Plavix [Clopidogrel Bisulfate] Rash    ROS Review of Systems  Constitutional: Negative.   HENT: Negative.    Eyes: Negative.   Respiratory: Negative.    Cardiovascular: Negative.   Gastrointestinal: Negative.   All other systems reviewed and are negative.    Objective:    Physical Exam Vitals and nursing note reviewed. Exam conducted with a chaperone present (Granddaughter).  HENT:     Head: Normocephalic.     Right Ear: Ear canal and external ear normal.     Left Ear: Ear canal and external ear normal.     Mouth/Throat:     Pharynx: Oropharynx is clear.  Eyes:     Conjunctiva/sclera: Conjunctivae normal.  Cardiovascular:     Rate and Rhythm: Normal rate and regular rhythm.     Pulses: Normal pulses.     Heart sounds: Normal heart sounds.  Pulmonary:     Effort: Pulmonary effort is normal.     Breath sounds: Normal breath sounds.  Abdominal:     General: Bowel sounds are normal.  Skin:    General: Skin is warm.     Findings: No rash.  Neurological:     Mental Status: She is oriented to person, place, and time.    BP (!) 183/54   Pulse 67   Temp (!) 97.3 F (36.3 C) (Temporal)   Ht 5' 4.5" (1.638 m)   Wt 134 lb 6.4 oz (61 kg)   BMI 22.71 kg/m  Wt Readings from Last 3 Encounters:  11/25/21 134 lb 6.4 oz (61 kg)  11/11/21 138 lb (62.6 kg)  11/03/21 137 lb 5.6 oz (62.3 kg)     There are no preventive care reminders to display for this patient.  There are no preventive care reminders to display for this patient.  Lab Results  Component Value Date   TSH 1.550 06/28/2020   Lab Results  Component Value Date   WBC 11.9 (H) 11/04/2021   HGB 11.4 (L) 11/04/2021   HCT 39.8 11/04/2021   MCV 80.7  11/04/2021   PLT 293 11/04/2021   Lab Results  Component Value Date   NA 134 (L) 11/04/2021   K 4.9 11/04/2021   CO2 20 (L) 11/04/2021   GLUCOSE 91 11/04/2021   BUN 29 (H) 11/04/2021   CREATININE 1.09 (H) 11/04/2021   BILITOT 0.6 11/04/2021   ALKPHOS 57 11/04/2021   AST 27 11/04/2021   ALT 16 11/04/2021   PROT  5.8 (L) 11/04/2021   ALBUMIN 2.8 (L) 11/04/2021   CALCIUM 7.9 (L) 11/04/2021   ANIONGAP 8 11/04/2021   EGFR 57 (L) 10/21/2021   Lab Results  Component Value Date   CHOL 173 11/04/2021   Lab Results  Component Value Date   HDL 35 (L) 11/04/2021   Lab Results  Component Value Date   LDLCALC 99 11/04/2021   Lab Results  Component Value Date   TRIG 193 (H) 11/04/2021   Lab Results  Component Value Date   CHOLHDL 4.9 11/04/2021   Lab Results  Component Value Date   HGBA1C 4.9 11/04/2021      Assessment & Plan:   Problem List Items Addressed This Visit       Cardiovascular and Mediastinum   Essential hypertension    No changes to current blood pressure reading, patient is scheduled to see cardiologist on Wednesday to reassess management plans.       No orders of the defined types were placed in this encounter.   Follow-up: Return in about 3 months (around 02/23/2022).    Ivy Lynn, NP

## 2021-11-26 NOTE — Assessment & Plan Note (Signed)
No changes to current blood pressure reading, patient is scheduled to see cardiologist on Wednesday to reassess management plans.

## 2021-11-28 ENCOUNTER — Inpatient Hospital Stay: Payer: 59 | Attending: Hematology

## 2021-11-28 ENCOUNTER — Other Ambulatory Visit: Payer: Self-pay

## 2021-11-28 ENCOUNTER — Inpatient Hospital Stay: Payer: 59

## 2021-11-28 DIAGNOSIS — D45 Polycythemia vera: Secondary | ICD-10-CM | POA: Insufficient documentation

## 2021-11-28 LAB — CBC WITH DIFFERENTIAL (CANCER CENTER ONLY)
Abs Immature Granulocytes: 0.11 10*3/uL — ABNORMAL HIGH (ref 0.00–0.07)
Basophils Absolute: 0.1 10*3/uL (ref 0.0–0.1)
Basophils Relative: 1 %
Eosinophils Absolute: 0.1 10*3/uL (ref 0.0–0.5)
Eosinophils Relative: 1 %
HCT: 37.6 % (ref 36.0–46.0)
Hemoglobin: 10.7 g/dL — ABNORMAL LOW (ref 12.0–15.0)
Immature Granulocytes: 1 %
Lymphocytes Relative: 6 %
Lymphs Abs: 0.6 10*3/uL — ABNORMAL LOW (ref 0.7–4.0)
MCH: 23 pg — ABNORMAL LOW (ref 26.0–34.0)
MCHC: 28.5 g/dL — ABNORMAL LOW (ref 30.0–36.0)
MCV: 80.9 fL (ref 80.0–100.0)
Monocytes Absolute: 0.2 10*3/uL (ref 0.1–1.0)
Monocytes Relative: 2 %
Neutro Abs: 10.1 10*3/uL — ABNORMAL HIGH (ref 1.7–7.7)
Neutrophils Relative %: 89 %
Platelet Count: 306 10*3/uL (ref 150–400)
RBC: 4.65 MIL/uL (ref 3.87–5.11)
RDW: 20.5 % — ABNORMAL HIGH (ref 11.5–15.5)
WBC Count: 11.4 10*3/uL — ABNORMAL HIGH (ref 4.0–10.5)
nRBC: 0 % (ref 0.0–0.2)

## 2021-11-28 NOTE — Progress Notes (Signed)
Patient here for therapeutic phelbotomy as needed per her Provider. Patient Hct is 37 today no need for procedure. Copy of labs given to patient and she voiced understanding.
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
## 2021-12-02 ENCOUNTER — Ambulatory Visit (INDEPENDENT_AMBULATORY_CARE_PROVIDER_SITE_OTHER): Payer: 59 | Admitting: General Practice

## 2021-12-02 ENCOUNTER — Encounter: Payer: Self-pay | Admitting: General Practice

## 2021-12-02 ENCOUNTER — Other Ambulatory Visit: Payer: Self-pay

## 2021-12-02 VITALS — BP 162/64 | HR 74 | Ht 64.5 in | Wt 138.0 lb

## 2021-12-02 DIAGNOSIS — E782 Mixed hyperlipidemia: Secondary | ICD-10-CM | POA: Diagnosis not present

## 2021-12-02 DIAGNOSIS — I480 Paroxysmal atrial fibrillation: Secondary | ICD-10-CM

## 2021-12-02 DIAGNOSIS — I739 Peripheral vascular disease, unspecified: Secondary | ICD-10-CM

## 2021-12-02 DIAGNOSIS — I10 Essential (primary) hypertension: Secondary | ICD-10-CM

## 2021-12-02 DIAGNOSIS — Z79899 Other long term (current) drug therapy: Secondary | ICD-10-CM

## 2021-12-02 DIAGNOSIS — I2510 Atherosclerotic heart disease of native coronary artery without angina pectoris: Secondary | ICD-10-CM | POA: Diagnosis not present

## 2021-12-02 MED ORDER — VALSARTAN 160 MG PO TABS: ORAL_TABLET | ORAL | 3 refills | Status: DC

## 2021-12-02 NOTE — Patient Instructions (Signed)
Medication Instructions:  INCREASE VALSARTAN 320MG  AM AND 160MG  PM  *If you need a refill on your cardiac medications before your next appointment, please call your pharmacy*  Lab Work: BMET IN 1 WEEK If you have labs (blood work) drawn today and your tests are completely normal, you will receive your results only by:  [place] (if you have MyChart) OR A paper copy in the mail.  If you have any lab test that is abnormal or we need to change your treatment, we will call you to review the results. You may go to any [facility] that is convenient for you however, we do have a lab in our office that is able to assist you. You DO NOT need an appointment for our lab. The lab is open 8:00am and closes at 4:00pm. Lunch 12:45 - 1:45pm.  Testing/Procedures: Your physician has requested that you have an ankle brachial index (ABI). During this test an ultrasound and blood pressure cuff are used to evaluate the arteries that supply the arms and legs with blood. Allow thirty minutes for this exam. There are no restrictions or special instructions.   Special Instructions PLEASE READ AND FOLLOW SALTY 6-ATTACHED-1,800 mg daily  PLEASE INCREASE PHYSICAL ACTIVITY AS TOLERATED  ELEVATE LOWER EXTREMITY  Follow-Up: Your next appointment:  3-4 month(s) In Person with Quay Burow, MD ONLY1  At [facility], you and your health needs are our priority.  As part of our continuing mission to provide you with exceptional heart care, we have created designated Provider Care Teams.  These Care Teams include your primary Cardiologist (physician) and Advanced Practice Providers (APPs -  Physician Assistants and Nurse Practitioners) who all work together to provide you with the care you need, when you need it.

## 2021-12-03 ENCOUNTER — Ambulatory Visit: Payer: 59 | Admitting: Nurse Practitioner

## 2021-12-03 ENCOUNTER — Other Ambulatory Visit: Payer: Self-pay | Admitting: General Practice

## 2021-12-03 DIAGNOSIS — I739 Peripheral vascular disease, unspecified: Secondary | ICD-10-CM

## 2021-12-03 DIAGNOSIS — Z95828 Presence of other vascular implants and grafts: Secondary | ICD-10-CM

## 2021-12-06 ENCOUNTER — Other Ambulatory Visit: Payer: Self-pay | Admitting: Nurse Practitioner

## 2021-12-09 ENCOUNTER — Ambulatory Visit: Payer: 59 | Admitting: Nurse Practitioner

## 2021-12-09 ENCOUNTER — Other Ambulatory Visit: Payer: 59

## 2021-12-10 ENCOUNTER — Other Ambulatory Visit: Payer: Self-pay

## 2021-12-10 DIAGNOSIS — I10 Essential (primary) hypertension: Secondary | ICD-10-CM

## 2021-12-10 LAB — BASIC METABOLIC PANEL
BUN/Creatinine Ratio: 18 (ref 12–28)
BUN: 18 mg/dL (ref 8–27)
CO2: 21 mmol/L (ref 20–29)
Calcium: 8.1 mg/dL — ABNORMAL LOW (ref 8.7–10.3)
Chloride: 105 mmol/L (ref 96–106)
Creatinine, Ser: 0.99 mg/dL (ref 0.57–1.00)
Glucose: 69 mg/dL — ABNORMAL LOW (ref 70–99)
Potassium: 5.1 mmol/L (ref 3.5–5.2)
Sodium: 140 mmol/L (ref 134–144)
eGFR: 61 mL/min/{1.73_m2} (ref >59)

## 2021-12-17 ENCOUNTER — Ambulatory Visit (HOSPITAL_BASED_OUTPATIENT_CLINIC_OR_DEPARTMENT_OTHER)
Admission: RE | Admit: 2021-12-17 | Discharge: 2021-12-17 | Disposition: A | Discharge status: Home / Self Care | Payer: 59 | Source: Ambulatory Visit | Attending: Internal Medicine | Admitting: Internal Medicine

## 2021-12-17 ENCOUNTER — Other Ambulatory Visit: Payer: Self-pay

## 2021-12-17 ENCOUNTER — Ambulatory Visit (HOSPITAL_COMMUNITY)
Admission: RE | Admit: 2021-12-17 | Discharge: 2021-12-17 | Disposition: A | Discharge status: Home / Self Care | Payer: 59 | Source: Ambulatory Visit | Attending: Internal Medicine | Admitting: Internal Medicine

## 2021-12-17 DIAGNOSIS — I739 Peripheral vascular disease, unspecified: Secondary | ICD-10-CM | POA: Insufficient documentation

## 2021-12-17 DIAGNOSIS — Z95828 Presence of other vascular implants and grafts: Secondary | ICD-10-CM | POA: Diagnosis not present

## 2021-12-18 ENCOUNTER — Other Ambulatory Visit: Payer: Self-pay

## 2021-12-18 DIAGNOSIS — I2510 Atherosclerotic heart disease of native coronary artery without angina pectoris: Secondary | ICD-10-CM

## 2021-12-18 DIAGNOSIS — Z95828 Presence of other vascular implants and grafts: Secondary | ICD-10-CM

## 2021-12-18 DIAGNOSIS — I739 Peripheral vascular disease, unspecified: Secondary | ICD-10-CM

## 2021-12-18 DIAGNOSIS — I6529 Occlusion and stenosis of unspecified carotid artery: Secondary | ICD-10-CM

## 2021-12-18 DIAGNOSIS — E782 Mixed hyperlipidemia: Secondary | ICD-10-CM

## 2021-12-23 ENCOUNTER — Telehealth: Payer: Self-pay | Admitting: Cardiovascular Disease

## 2021-12-23 MED ORDER — VALSARTAN 320 MG PO TABS: ORAL_TABLET | ORAL | 3 refills | Status: DC

## 2021-12-23 MED ORDER — VALSARTAN 160 MG PO TABS: ORAL_TABLET | ORAL | 3 refills | Status: DC

## 2021-12-23 NOTE — Telephone Encounter (Signed)
Pt c/o medication issue:  1. Name of Medication: valsartan (DIOVAN) 160 MG tablet  2. How are you currently taking this medication (dosage and times per day)?  Take 2 tablets (320 mg total) by mouth every morning AND 1 tablet (160 mg total) every evening  3. Are you having a reaction (difficulty breathing--STAT)? no  4. What is your medication issue? Pharmacy calling to say that patient insurance is not willing to cover the prescription for 3 pills. Pharmacy suggest to wright two different prescription for the medication. Or to get a authorization for all 3 pills being on one prescription.

## 2021-12-23 NOTE — Telephone Encounter (Signed)
Spoke to pharmacist at [facility].He stated Diovan prescription will have to be 2 different prescriptions in order for her insurance to pay.New prescriptions sent in for Diovan 320 mg in morning and Diovan 160 mg in pm.

## 2021-12-24 ENCOUNTER — Other Ambulatory Visit: Payer: Self-pay | Admitting: General Practice

## 2021-12-24 DIAGNOSIS — E782 Mixed hyperlipidemia: Secondary | ICD-10-CM

## 2021-12-24 DIAGNOSIS — I739 Peripheral vascular disease, unspecified: Secondary | ICD-10-CM

## 2021-12-24 DIAGNOSIS — I6529 Occlusion and stenosis of unspecified carotid artery: Secondary | ICD-10-CM

## 2021-12-24 DIAGNOSIS — Z95828 Presence of other vascular implants and grafts: Secondary | ICD-10-CM

## 2021-12-24 DIAGNOSIS — I2510 Atherosclerotic heart disease of native coronary artery without angina pectoris: Secondary | ICD-10-CM

## 2021-12-27 ENCOUNTER — Other Ambulatory Visit: Payer: Self-pay | Admitting: Nurse Practitioner

## 2021-12-27 DIAGNOSIS — I482 Chronic atrial fibrillation, unspecified: Secondary | ICD-10-CM

## 2021-12-29 ENCOUNTER — Other Ambulatory Visit: Payer: Self-pay | Admitting: Nurse Practitioner

## 2021-12-29 DIAGNOSIS — K219 Gastro-esophageal reflux disease without esophagitis: Secondary | ICD-10-CM

## 2022-01-09 ENCOUNTER — Inpatient Hospital Stay: Payer: 59

## 2022-01-09 ENCOUNTER — Other Ambulatory Visit: Payer: Self-pay

## 2022-01-09 ENCOUNTER — Inpatient Hospital Stay: Payer: 59 | Attending: Hematology

## 2022-01-09 DIAGNOSIS — D45 Polycythemia vera: Secondary | ICD-10-CM | POA: Insufficient documentation

## 2022-01-09 LAB — CBC WITH DIFFERENTIAL (CANCER CENTER ONLY)
Abs Immature Granulocytes: 0.07 10*3/uL (ref 0.00–0.07)
Basophils Absolute: 0.2 10*3/uL — ABNORMAL HIGH (ref 0.0–0.1)
Basophils Relative: 1 %
Eosinophils Absolute: 0.6 10*3/uL — ABNORMAL HIGH (ref 0.0–0.5)
Eosinophils Relative: 4 %
HCT: 39.7 % (ref 36.0–46.0)
Hemoglobin: 11.5 g/dL — ABNORMAL LOW (ref 12.0–15.0)
Immature Granulocytes: 0 %
Lymphocytes Relative: 7 %
Lymphs Abs: 1.1 10*3/uL (ref 0.7–4.0)
MCH: 23.3 pg — ABNORMAL LOW (ref 26.0–34.0)
MCHC: 29 g/dL — ABNORMAL LOW (ref 30.0–36.0)
MCV: 80.4 fL (ref 80.0–100.0)
Monocytes Absolute: 0.2 10*3/uL (ref 0.1–1.0)
Monocytes Relative: 1 %
Neutro Abs: 13.6 10*3/uL — ABNORMAL HIGH (ref 1.7–7.7)
Neutrophils Relative %: 87 %
Platelet Count: 353 10*3/uL (ref 150–400)
RBC: 4.94 MIL/uL (ref 3.87–5.11)
RDW: 19.8 % — ABNORMAL HIGH (ref 11.5–15.5)
Smear Review: NORMAL
WBC Count: 15.7 10*3/uL — ABNORMAL HIGH (ref 4.0–10.5)
nRBC: 0 % (ref 0.0–0.2)

## 2022-01-09 LAB — FERRITIN: Ferritin: 20 ng/mL (ref 11–307)

## 2022-01-09 NOTE — Progress Notes (Signed)
Patient arrived for phlebotomy with parameter to perform only in HCT >45% per MD. HCT today is 39.7. Patient does not meet criteria for therapeutic phlebotomy today. This RN met with patient in the lobby and discussed that patient did not need treatment today. Patient agreeable and discharged from facility.

## 2022-01-16 ENCOUNTER — Encounter: Payer: Self-pay | Admitting: Nurse Practitioner

## 2022-01-16 ENCOUNTER — Ambulatory Visit (INDEPENDENT_AMBULATORY_CARE_PROVIDER_SITE_OTHER): Payer: 59 | Admitting: Nurse Practitioner

## 2022-01-16 ENCOUNTER — Other Ambulatory Visit: Payer: Self-pay | Admitting: Nurse Practitioner

## 2022-01-16 VITALS — BP 182/72 | HR 72 | Temp 98.0°F | Ht 64.5 in | Wt 133.1 lb

## 2022-01-16 DIAGNOSIS — L03116 Cellulitis of left lower limb: Secondary | ICD-10-CM

## 2022-01-16 DIAGNOSIS — E782 Mixed hyperlipidemia: Secondary | ICD-10-CM

## 2022-01-16 DIAGNOSIS — Z8673 Personal history of transient ischemic attack (TIA), and cerebral infarction without residual deficits: Secondary | ICD-10-CM

## 2022-01-16 DIAGNOSIS — I214 Non-ST elevation (NSTEMI) myocardial infarction: Secondary | ICD-10-CM

## 2022-01-16 DIAGNOSIS — I10 Essential (primary) hypertension: Secondary | ICD-10-CM | POA: Diagnosis not present

## 2022-01-16 HISTORY — DX: Cellulitis of left lower limb: L03.116

## 2022-01-16 MED ORDER — CEFTRIAXONE SODIUM 1 G IJ SOLR
1.0000 g | Freq: Once | INTRAMUSCULAR | Status: AC
  Administered 2022-01-16: 1 g via INTRAMUSCULAR

## 2022-01-16 MED ORDER — DOXYCYCLINE HYCLATE 100 MG PO TABS: 100.0000 mg | ORAL_TABLET | Freq: Two times a day (BID) | ORAL | 0 refills | Status: DC

## 2022-01-16 NOTE — Assessment & Plan Note (Signed)
No new signs and symptoms of hyperlipidemia. Patients symptoms well controlled.

## 2022-01-16 NOTE — Assessment & Plan Note (Signed)
Patients hypertension is not well controlled, patient has no new or worsening symptoms, she is managed by cardiology and has an appointment next week
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
## 2022-01-16 NOTE — Patient Instructions (Signed)
Cellulitis, Adult Cellulitis is a skin infection. The infected area is often warm, red, swollen, and sore. It occurs most often in the arms and lower legs. It is very important to get treated for this condition. What are the causes? This condition is caused by bacteria. The bacteria enter through a break in the skin, such as a cut, burn, insect bite, open sore, or crack. What increases the risk? This condition is more likely to occur in people who: Have a weak body defense system (immune system). Have open cuts, burns, bites, or scrapes on the skin. Are older than 71 years of age. Have a blood sugar problem (diabetes). Have a long-lasting (chronic) liver disease (cirrhosis) or kidney disease. Are very overweight (obese). Have a skin problem, such as: Itchy rash (eczema). Slow movement of blood in the veins (venous stasis). Fluid buildup below the skin (edema). Have been treated with high-energy rays (radiation). Use IV drugs. What are the signs or symptoms? Symptoms of this condition include: Skin that is: Red. Streaking. Spotting. Swollen. Sore or painful when you touch it. Warm. A fever. Chills. Blisters. How is this diagnosed? This condition is diagnosed based on: Medical history. Physical exam. Blood tests. Imaging tests. How is this treated? Treatment for this condition may include: Medicines to treat infections or allergies. Home care, such as: Rest. Placing cold or warm cloths (compresses) on the skin. Hospital care, if the condition is very bad. Follow these instructions at home: Medicines Take over-the-counter and prescription medicines only as told by your doctor. If you were prescribed an antibiotic medicine, take it as told by your doctor. Do not stop taking it even if you start to feel better. General instructions  Drink enough fluid to keep your pee (urine) pale yellow. Do not touch or rub the infected area. Raise (elevate) the infected area above the  level of your heart while you are sitting or lying down. Place cold or warm cloths on the area as told by your doctor. Keep all follow-up visits as told by your doctor. This is important. Contact a doctor if: You have a fever. You do not start to get better after 1-2 days of treatment. Your bone or joint under the infected area starts to hurt after the skin has healed. Your infection comes back. This can happen in the same area or another area. You have a swollen bump in the area. You have new symptoms. You feel ill and have muscle aches and pains. Get help right away if: Your symptoms get worse. You feel very sleepy. You throw up (vomit) or have watery poop (diarrhea) for a long time. You see red streaks coming from the area. Your red area gets larger. Your red area turns dark in color. These symptoms may represent a serious problem that is an emergency. Do not wait to see if the symptoms will go away. Get medical help right away. Call your local emergency services (911 in the [place] Do not drive yourself to the hospital. Summary Cellulitis is a skin infection. The area is often warm, red, swollen, and sore. This condition is treated with medicines, rest, and cold and warm cloths. Take all medicines only as told by your doctor. Tell your doctor if symptoms do not start to get better after 1-2 days of treatment. This information is not intended to replace advice given to you by your health care provider. Make sure you discuss any questions you have with your health care provider. Document Revised: 04/28/2018 Document Reviewed:  04/28/2018 Elsevier Patient Education  2022 [employer].

## 2022-01-16 NOTE — Assessment & Plan Note (Signed)
Symptoms not well controlled. 1 gram Rocephin in clinic, doxycycline 100 mg tablet by mouth daily. Follow up in 3 days

## 2022-01-16 NOTE — Assessment & Plan Note (Signed)
>>  ASSESSMENT AND PLAN FOR MIXED HYPERLIPIDEMIA WRITTEN ON 01/16/2022  9:42 PM BY IJAOLA, ONYEJE M, NP  No new signs and symptoms of hyperlipidemia. Patients symptoms well controlled.

## 2022-01-19 ENCOUNTER — Encounter: Payer: Self-pay | Admitting: Nurse Practitioner

## 2022-01-19 ENCOUNTER — Ambulatory Visit (INDEPENDENT_AMBULATORY_CARE_PROVIDER_SITE_OTHER): Payer: 59 | Admitting: Nurse Practitioner

## 2022-01-19 VITALS — BP 173/69 | HR 64 | Temp 98.4°F | Ht 64.5 in | Wt 133.0 lb

## 2022-01-19 DIAGNOSIS — L03116 Cellulitis of left lower limb: Secondary | ICD-10-CM

## 2022-01-19 NOTE — Progress Notes (Addendum)
Acute Office Visit  Subjective:    Patient ID: [name], female    DOB: 06/21/1951, 71 y.o.   MRN: [id number]  Chief Complaint  Patient presents with   Wound Check    HPI Patient is in today for follow-up of left leg cellulitis.  Skin around wound is healing as expected.  Redness reduced, mild tenderness.  No nausea, vomiting or fever.  Past Medical History:  Diagnosis Date   Arthritis    "fingers, some in my knees" (01/28/2016)   CAD (coronary artery disease), per cath 04/27/13, non obstructive disease, EF 65-70% 05/11/2013   Carotid disease, bilateral ([place]) 09/20/2013   Lt carotid 50-69% stentosis, less on rt   Dysrhythmia    Fatty liver    GERD (gastroesophageal reflux disease)    Glaucoma    H/O cardiovascular stress test 12/27/2009   negative for ischemia   Headache    "occasional since eye pressure regulated" (01/28/2016)   Hyperlipidemia    Hypertension    Leukocytosis 07/15/2015   Migraine    "stopped w/laser holes to relieve pressure in my eyes; had them 3-4 times/wk before taht" (01/28/2016)   Mitral regurgitation,2+ by cath 05/11/2013   NSTEMI (non-ST elevated myocardial infarction) ([place]) 04/27/2013   Pain    BOTH KNEES - PT HAS TORN MENISCUS LEFT KNEE   Paroxysmal atrial fibrillation (HCC)    hx of a fib on ASA  AND ELIQUIS    Polycythemia vera ([place]) 08/20/2015   JAK-2 positive 07/19/15   PVD (peripheral vascular disease) with claudication ([place]) 07/21/2006   previous L ext iliac stent and rt SFA stent, known occluded Lt SFA   S/P cardiac cath 04/27/2013   NON OBSTRUCTIVE DISEASE, 2+ mr, ef 65-70%   Statin intolerance    Stroke ([place]) 12/21/2004   SWELLING ALL OVER AND RT SIDE OF FACE DRAWN AND SPEECH SLURRED AND NUMBNESS ON RIGHT SIDE-- ALL RESOLVED   TIA (transient ischemic attack)     Past Surgical History:  Procedure Laterality Date   AMPUTATION Right 05/21/2017   Procedure: AMPUTATION ABOVE KNEE;  Surgeon: Rosetta Posner, MD;  Location: [facility];   Service: Vascular;  Laterality: Right;   CARDIAC CATHETERIZATION  04/27/13   non occlusive disease with mild to mod. calcified lesions in ostial LM and proximal LAD and moderate prox. RC AND DISTAL AV GROOVE LCX, ef   CATARACT EXTRACTION W/PHACO Right 03/21/2015   Procedure: CATARACT EXTRACTION PHACO AND INTRAOCULAR LENS PLACEMENT RIGHT EYE;  Surgeon: Baruch Goldmann, MD;  [facility]: [facility];  Service: Ophthalmology;  Laterality: Right;  CDE:5.10   CATARACT EXTRACTION W/PHACO Left 05/16/2015   Procedure: CATARACT EXTRACTION PHACO AND INTRAOCULAR LENS PLACEMENT (IOC);  Surgeon: Baruch Goldmann, MD;  Location: [facility];  Service: Ophthalmology;  Laterality: Left;  CDE 5.57   CESAREAN SECTION  1977   COLONOSCOPY N/A 09/11/2016   Procedure: COLONOSCOPY;  Surgeon: Rogene Houston, MD;  [facility];  Service: Endoscopy;  Laterality: N/A;  730-moved to 1:00 Ann notified pt   EMBOLECTOMY Right 02/01/2016   Procedure: Thrombectomy of Right Femoral-Popliteal Bypass Graft; Endarterectomy of Right Below Knee Popliteal Artery and Tibial Peroneal Trunk with Bovine Pericardium Patch Angioplasty.;  Surgeon: Angelia Mould, MD;  Location: [facility];  Service: Vascular;  Laterality: Right;   FEMORAL-POPLITEAL BYPASS GRAFT Right 01/31/2016   Procedure: BYPASS GRAFT RIGHT COMMON  FEMORALTO BELOW KNEE POPLITEAL ARTERY BYPASS GRAFT USING 6MM PROPATEN GORTEX GRAFT;  Surgeon: Mal Misty, MD;  [facility];  Service: Vascular;  Laterality: Right;   FEMORAL-POPLITEAL BYPASS GRAFT Right 01/09/2017   Procedure: THROMBECTOMY OF RIGHT FEMORAL-POPLITEAL  ARTERY BYPASS GRAFT; THROMBECTOMY RIGHT  TIBIAL VESSELS;  Surgeon: [name], MD;  Location: [facility];  Service: Vascular;  Laterality: Right;   FEMORAL-POPLITEAL BYPASS GRAFT Right 05/17/2017   Procedure: RIGHT FEMORAL-TIBIAL PERONEAL TRUNK ARTERY BYPASS GRAFT USING 70mX80cm PROPATEN GRAFT WITH REMOVABLE RING;  Surgeon: ERosetta Posner MD;  [facility]  Service:  Vascular;  Laterality: Right;   FRACTURE SURGERY     ILIAC ARTERY STENT  07/2006   external iliac and Rt SFA stent 07/2006 AND THE LEFT WAS IN 2006   KNEE ARTHROSCOPY WITH MEDIAL MENISECTOMY Left 03/27/2014   Procedure: left knee arthorscopy with medial chondraplasty of the medial femoral and patella, medial microfracture technique of medial femoral condyl;  Surgeon: RTobi Bastos MD;  [facility];  Service: Orthopedics;  Laterality: Left;   LEFT HEART CATHETERIZATION WITH CORONARY ANGIOGRAM Right 04/27/2013   Procedure: LEFT HEART CATHETERIZATION WITH CORONARY ANGIOGRAM;  Surgeon: DLeonie Man MD;  [facility];  Service: Cardiovascular;  Laterality: Right;   LOWER EXTREMITY ANGIOGRAM Right 01/31/2016   Procedure: INTRAOP RIGHT LOWER EXTREMITY ANGIOGRAM;  Surgeon: JMal Misty MD;  [facility]  Service: Vascular;  Laterality: Right;   ORIF WRIST FRACTURE Right 1983   OVARIAN CYST REMOVAL Right    PATCH ANGIOPLASTY Right 01/31/2016   Procedure: RIGHT COMMON FEMORAL AND PROFUNDA FEMORIS ENDARECTOMY WITH PATCH ANGIOPLASTY;  Surgeon: JMal Misty MD;  [facility]  Service: Vascular;  Laterality: Right;   PATCH ANGIOPLASTY Right 01/09/2017   Procedure: PATCH ANGIOPLASTY RIGHT POPLITEAL ARTERY BYPASS GRAFT;  Surgeon: CElam Dutch MD;  [facility]  Service: Vascular;  Laterality: Right;   PERIPHERAL VASCULAR CATHETERIZATION N/A 06/27/2015   Procedure: Lower Extremity Angiography;  Surgeon: JLorretta Harp MD;  [facility];  Service: Cardiovascular;  Laterality: N/A;   PERIPHERAL VASCULAR CATHETERIZATION Bilateral 01/29/2016   Procedure: Lower Extremity Angiography;  Surgeon: MWellington Hampshire MD;  [facility];  Service: Cardiovascular;  Laterality: Bilateral;   PERIPHERAL VASCULAR CATHETERIZATION N/A 01/29/2016   Procedure: Abdominal Aortogram;  Surgeon: MWellington Hampshire MD;  [facility];  Service: Cardiovascular;   Laterality: N/A;   REFRACTIVE SURGERY Bilateral    "6 in one eye; 7 in the other; to relieve pressure; not glaucoma" (01/28/2016)   SFA Right 06/27/2015   overlapping Bahn covered stents   [place] 05/17[place]2017   Procedure: LEFT LEG GREATER SAPHENOUS VEIN HARVEST;  Surgeon: ERosetta Posner MD;  [facility];  Service: Vascular;  Laterality: Left;   VEIN REPAIR Right 01/31/2016   Procedure: RIGHT GREATER SAPHENOUS VEIN EXAMNED BUT NOT REMOVED;  Surgeon: JMal Misty MD;  [facility];  Service: Vascular;  Laterality: Right;    Family History  Problem Relation Age of Onset   CAD Mother    Lung cancer Mother    Heart attack Mother    CAD Father    Heart disease Father    Heart attack Father 447      died in hes 640'swith heart disease   Healthy Sister    Liver cancer Brother    Healthy Sister    CAD Brother        has had CABG   CAD Brother  Social History   Socioeconomic History   Marital status: Widowed    Spouse name: Not on file   Number of children: 2   Years of education: 67   Highest education level: Not on file  Occupational History   Occupation: CNA  Tobacco Use   Smoking status: Former    Packs/day: 1.00    Years: 45.00    Pack years: 45.00    Types: Cigarettes    Quit date: 11/11/2012    Years since quitting: 9.1   Smokeless tobacco: Never   Tobacco comments:    quit 4 years ago.    10/27/21-Pt declines lung ca screen at this time.   Vaping Use   Vaping Use: Never used  Substance and Sexual Activity   Alcohol use: No    Alcohol/week: 0.0 standard drinks   Drug use: No   Sexual activity: Not Currently    Birth control/protection: None  Other Topics Concern   Not on file  Social History Narrative   Husband passed away after CVA in 2005-03-08.   2 children, 5 grandchildren and 1 great grandchild.   Social Determinants of Health   Financial Resource Strain: Low Risk    Difficulty of Paying Living Expenses: Not hard  at all  Food Insecurity: No Food Insecurity   Worried About Charity fundraiser in the Last Year: Never true   [place] in the Last Year: Never true  Transportation Needs: No Transportation Needs   Lack of Transportation (Medical): No   Lack of Transportation (Non-Medical): No  Physical Activity: Insufficiently Active   Days of Exercise per Week: 3 days   Minutes of Exercise per Session: 30 min  Stress: No Stress Concern Present   Feeling of Stress : Not at all  Social Connections: Moderately Integrated   Frequency of Communication with Friends and Family: More than three times a week   Frequency of Social Gatherings with Friends and Family: More than three times a week   Attends Religious Services: 1 to 4 times per year   Active Member of [employer] or Organizations: Yes   Attends Archivist Meetings: 1 to 4 times per year   Marital Status: Widowed  Human resources officer Violence: Not At Risk   Fear of Current or Ex-Partner: No   Emotionally Abused: No   Physically Abused: No   Sexually Abused: No    Outpatient Medications Prior to Visit  Medication Sig Dispense Refill   acetaminophen (TYLENOL) 500 MG tablet Take 2 tablets (1,000 mg total) by mouth every 6 (six) hours. 30 tablet 0   ARTIFICIAL TEAR OP Place 1 drop into both eyes as needed (for dry eyes).     aspirin EC 325 MG tablet Take 325 mg by mouth daily.     calcium-vitamin D (OSCAL WITH D) 500-200 MG-UNIT tablet Take 1 tablet by mouth daily with breakfast. 90 tablet 1   diclofenac Sodium (VOLTAREN) 1 % GEL Apply 2 g topically 4 (four) times daily. 50 g 0   diltiazem (CARDIZEM CD) 240 MG 24 hr capsule TAKE 1 CAPSULE EVERY MORNING 90 capsule 0   diphenhydrAMINE (BENADRYL) 25 MG tablet Take 25 mg by mouth every 6 (six) hours as needed.     doxycycline (VIBRA-TABS) 100 MG tablet Take 1 tablet (100 mg total) by mouth 2 (two) times daily. 14 tablet 0   ELIQUIS 5 MG TABS tablet TAKE ONE TABLET TWICE DAILY 60 tablet 0    escitalopram (LEXAPRO) 20  MG tablet TAKE ONE TABLET ONCE DAILY 30 tablet 0   ezetimibe (ZETIA) 10 MG tablet TAKE ONE TABLET ONCE DAILY 30 tablet 5   fenofibrate (TRICOR) 145 MG tablet Take 1 tablet (145 mg total) by mouth daily. (NEEDS TO BE SEEN BEFORE NEXT REFILL) 30 tablet 0   flecainide (TAMBOCOR) 50 MG tablet TAKE 1 TABLET 2 TIMES A DAY 60 tablet 2   hydroxyurea (HYDREA) 500 MG capsule Take 1 capsule (500 mg total) by mouth daily. Take one capsule daily except Sundays. May take with food to minimize GI side effects. 30 capsule 3   omeprazole (PRILOSEC) 20 MG capsule TAKE ONE CAPSULE TWICE DAILY BEFORE MEALS 180 capsule 0   traMADol (ULTRAM) 50 MG tablet Take 50 mg by mouth daily as needed for moderate pain.     valsartan (DIOVAN) 160 MG tablet Take 160 mg every afternoon 90 tablet 3   valsartan (DIOVAN) 320 MG tablet Take 320 mg every morning 90 tablet 3   No facility-administered medications prior to visit.    Allergies  Allergen Reactions   Atenolol Rash and Itching   Demerol  [Meperidine Hcl] Itching   Demerol [Meperidine] Other (See Comments) and Itching    Unknown, can't remember  Unknown, can't remember    Gabapentin Anxiety and Other (See Comments)    SI and HI   Inderal [Propranolol] Other (See Comments) and Itching    Doesn't remember    Prednisone Other (See Comments)    Muscle spasms   Statins Itching and Other (See Comments)    Simvastatin-caused severe itching Pravastatin-caused lesser tiching One type of statin? Caused stroke in 2006, and other symptoms as result   Warfarin And Related Other (See Comments)    Caused nose bleeds   Crestor [Rosuvastatin] Itching and Rash   Docosahexaenoic Acid (Dha)     Other reaction(s): Other (See Comments) nosebleeds   Lactose Intolerance (Gi) Other (See Comments)    Bloating, gas   Lipitor [Atorvastatin] Rash   Lovaza [Omega-3-Acid Ethyl Esters] Other (See Comments)    nosebleeds    Penicillins Hives, Rash and Other (See  Comments)    Has patient had a PCN reaction causing immediate rash, facial/tongue/throat swelling, SOB or lightheadedness with hypotension: Yes Has patient had a PCN reaction causing severe rash involving mucus membranes or skin necrosis: No Has patient had a PCN reaction that required hospitalization: No Has patient had a PCN reaction occurring within the last 10 years: No If all of the above answers are "NO", then may proceed with Cephalosporin use.  Questionable high fever    Pravastatin Itching and Rash   Simvastatin Itching, Rash and Other (See Comments)   Trilipix [Choline Fenofibrate] Other (See Comments)    Doesn't remember    Warfarin Other (See Comments)    nosebleeds   Hydralazine Swelling   Effexor [Venlafaxine Hcl] Other (See Comments)    Doesn't remember    Nexium [Esomeprazole Magnesium] Rash   Percocet [Oxycodone-Acetaminophen] Other (See Comments)    Doesn't remember    Plavix [Clopidogrel Bisulfate] Rash    Review of Systems  Constitutional: Negative.   HENT: Negative.    Eyes: Negative.   Respiratory: Negative.    Gastrointestinal: Negative.   Skin:  Positive for color change and wound.  Neurological: Negative.   All other systems reviewed and are negative.     Objective:    Physical Exam Vitals and nursing note reviewed.  Constitutional:      Appearance: Normal appearance.  HENT:     Head: Normocephalic.     Nose: Nose normal.     Mouth/Throat:     Mouth: Mucous membranes are moist.     Pharynx: Oropharynx is clear.  Eyes:     Conjunctiva/sclera: Conjunctivae normal.  Cardiovascular:     Rate and Rhythm: Normal rate and regular rhythm.     Pulses: Normal pulses.     Heart sounds: Normal heart sounds.  Pulmonary:     Effort: Pulmonary effort is normal.     Breath sounds: Normal breath sounds.  Abdominal:     General: Bowel sounds are normal.  Skin:    General: Skin is warm.     Findings: Erythema and wound present.       Neurological:      Mental Status: She is alert.  Psychiatric:        Behavior: Behavior normal.    BP (!) 173/69    Pulse 64    Temp 98.4 F (36.9 C)    Ht 5' 4.5" (1.638 m)    Wt 133 lb (60.3 kg)    SpO2 98%    BMI 22.48 kg/m  Wt Readings from Last 3 Encounters:  01/19/22 133 lb (60.3 kg)  01/16/22 133 lb 2 oz (60.4 kg)  12/02/21 138 lb (62.6 kg)    There are no preventive care reminders to display for this patient.  There are no preventive care reminders to display for this patient.   Lab Results  Component Value Date   TSH 1.550 06/28/2020   Lab Results  Component Value Date   WBC 15.7 (H) 01/09/2022   HGB 11.5 (L) 01/09/2022   HCT 39.7 01/09/2022   MCV 80.4 01/09/2022   PLT 353 01/09/2022   Lab Results  Component Value Date   NA 140 12/09/2021   K 5.1 12/09/2021   CO2 21 12/09/2021   GLUCOSE 69 (L) 12/09/2021   BUN 18 12/09/2021   CREATININE 0.99 12/09/2021   BILITOT 0.6 11/04/2021   ALKPHOS 57 11/04/2021   AST 27 11/04/2021   ALT 16 11/04/2021   PROT 5.8 (L) 11/04/2021   ALBUMIN 2.8 (L) 11/04/2021   CALCIUM 8.1 (L) 12/09/2021   ANIONGAP 8 11/04/2021   EGFR 61 12/09/2021   Lab Results  Component Value Date   CHOL 173 11/04/2021   Lab Results  Component Value Date   HDL 35 (L) 11/04/2021   Lab Results  Component Value Date   LDLCALC 99 11/04/2021   Lab Results  Component Value Date   TRIG 193 (H) 11/04/2021   Lab Results  Component Value Date   CHOLHDL 4.9 11/04/2021   Lab Results  Component Value Date   HGBA1C 4.9 11/04/2021       Assessment & Plan:  Cellulitis healing as expected.  Patient knows to follow-up with worsening unresolved symptoms.  Advised to continue and complete antibiotic as prescribed.  Patient verbalized understanding.   Problem List Items Addressed This Visit       Other   Cellulitis of left leg - Primary     No orders of the defined types were placed in this encounter.    Ivy Lynn, NP

## 2022-01-19 NOTE — Patient Instructions (Signed)
Cellulitis, Adult Cellulitis is a skin infection. The infected area is often warm, red, swollen, and sore. It occurs most often in the arms and lower legs. It is very important to get treated for this condition. What are the causes? This condition is caused by bacteria. The bacteria enter through a break in the skin, such as a cut, burn, insect bite, open sore, or crack. What increases the risk? This condition is more likely to occur in people who: Have a weak body defense system (immune system). Have open cuts, burns, bites, or scrapes on the skin. Are older than 71 years of age. Have a blood sugar problem (diabetes). Have a long-lasting (chronic) liver disease (cirrhosis) or kidney disease. Are very overweight (obese). Have a skin problem, such as: Itchy rash (eczema). Slow movement of blood in the veins (venous stasis). Fluid buildup below the skin (edema). Have been treated with high-energy rays (radiation). Use IV drugs. What are the signs or symptoms? Symptoms of this condition include: Skin that is: Red. Streaking. Spotting. Swollen. Sore or painful when you touch it. Warm. A fever. Chills. Blisters. How is this diagnosed? This condition is diagnosed based on: Medical history. Physical exam. Blood tests. Imaging tests. How is this treated? Treatment for this condition may include: Medicines to treat infections or allergies. Home care, such as: Rest. Placing cold or warm cloths (compresses) on the skin. Hospital care, if the condition is very bad. Follow these instructions at home: Medicines Take over-the-counter and prescription medicines only as told by your doctor. If you were prescribed an antibiotic medicine, take it as told by your doctor. Do not stop taking it even if you start to feel better. General instructions  Drink enough fluid to keep your pee (urine) pale yellow. Do not touch or rub the infected area. Raise (elevate) the infected area above the  level of your heart while you are sitting or lying down. Place cold or warm cloths on the area as told by your doctor. Keep all follow-up visits as told by your doctor. This is important. Contact a doctor if: You have a fever. You do not start to get better after 1-2 days of treatment. Your bone or joint under the infected area starts to hurt after the skin has healed. Your infection comes back. This can happen in the same area or another area. You have a swollen bump in the area. You have new symptoms. You feel ill and have muscle aches and pains. Get help right away if: Your symptoms get worse. You feel very sleepy. You throw up (vomit) or have watery poop (diarrhea) for a long time. You see red streaks coming from the area. Your red area gets larger. Your red area turns dark in color. These symptoms may represent a serious problem that is an emergency. Do not wait to see if the symptoms will go away. Get medical help right away. Call your local emergency services (911 in the [place] Do not drive yourself to the hospital. Summary Cellulitis is a skin infection. The area is often warm, red, swollen, and sore. This condition is treated with medicines, rest, and cold and warm cloths. Take all medicines only as told by your doctor. Tell your doctor if symptoms do not start to get better after 1-2 days of treatment. This information is not intended to replace advice given to you by your health care provider. Make sure you discuss any questions you have with your health care provider. Document Revised: 04/28/2018 Document Reviewed:  04/28/2018 Elsevier Patient Education  2022 [employer].

## 2022-01-30 ENCOUNTER — Other Ambulatory Visit: Payer: Self-pay | Admitting: Nurse Practitioner

## 2022-01-30 DIAGNOSIS — I482 Chronic atrial fibrillation, unspecified: Secondary | ICD-10-CM

## 2022-02-12 ENCOUNTER — Telehealth: Payer: Self-pay | Admitting: Nurse Practitioner

## 2022-02-12 NOTE — Telephone Encounter (Signed)
°  Prescription Request  02/12/2022  Is this a "Controlled Substance" medicine? no  Have you seen your PCP in the last 2 weeks? yes  If YES, route message to pool  -  If NO, patient needs to be scheduled for appointment.  What is the name of the medication or equipment? Needs another antibiotic for leg. She is out  Have you contacted your pharmacy to request a refill? no   Which pharmacy would you like this sent to? [facility]   Patient notified that their request is being sent to the clinical staff for review and that they should receive a response within 2 business days.

## 2022-02-12 NOTE — Telephone Encounter (Signed)
Lmtcb.

## 2022-02-13 NOTE — Telephone Encounter (Signed)
Offered pt an appt today to check her leg, she doesn't have a ride and already has an appt on 02/16/22 and would like to wait

## 2022-02-16 ENCOUNTER — Encounter: Payer: Self-pay | Admitting: Nurse Practitioner

## 2022-02-16 ENCOUNTER — Ambulatory Visit (INDEPENDENT_AMBULATORY_CARE_PROVIDER_SITE_OTHER): Payer: 59 | Admitting: Nurse Practitioner

## 2022-02-16 ENCOUNTER — Other Ambulatory Visit: Payer: Self-pay | Admitting: Nurse Practitioner

## 2022-02-16 VITALS — BP 101/63 | HR 72 | Temp 98.5°F | Ht 64.5 in | Wt 136.0 lb

## 2022-02-16 DIAGNOSIS — E782 Mixed hyperlipidemia: Secondary | ICD-10-CM | POA: Diagnosis not present

## 2022-02-16 DIAGNOSIS — I10 Essential (primary) hypertension: Secondary | ICD-10-CM | POA: Diagnosis not present

## 2022-02-16 DIAGNOSIS — Z8673 Personal history of transient ischemic attack (TIA), and cerebral infarction without residual deficits: Secondary | ICD-10-CM

## 2022-02-16 DIAGNOSIS — K219 Gastro-esophageal reflux disease without esophagitis: Secondary | ICD-10-CM

## 2022-02-16 DIAGNOSIS — I214 Non-ST elevation (NSTEMI) myocardial infarction: Secondary | ICD-10-CM

## 2022-02-16 DIAGNOSIS — M71322 Other bursal cyst, left elbow: Secondary | ICD-10-CM

## 2022-02-16 DIAGNOSIS — M6740 Ganglion, unspecified site: Secondary | ICD-10-CM

## 2022-02-16 DIAGNOSIS — M71321 Other bursal cyst, right elbow: Secondary | ICD-10-CM

## 2022-02-16 DIAGNOSIS — M1712 Unilateral primary osteoarthritis, left knee: Secondary | ICD-10-CM

## 2022-02-16 MED ORDER — FENOFIBRATE 145 MG PO TABS: 145.0000 mg | ORAL_TABLET | Freq: Every day | ORAL | 0 refills | Status: DC

## 2022-02-16 MED ORDER — DILTIAZEM HCL ER COATED BEADS 240 MG PO CP24: 240.0000 mg | ORAL_CAPSULE | Freq: Every morning | ORAL | 0 refills | Status: DC

## 2022-02-16 MED ORDER — MUPIROCIN CALCIUM 2 % EX CREA: 1.0000 "application " | TOPICAL_CREAM | Freq: Two times a day (BID) | CUTANEOUS | 0 refills | Status: DC

## 2022-02-16 MED ORDER — EZETIMIBE 10 MG PO TABS: 10.0000 mg | ORAL_TABLET | Freq: Every day | ORAL | 5 refills | Status: DC

## 2022-02-16 MED ORDER — ESCITALOPRAM OXALATE 20 MG PO TABS: 20.0000 mg | ORAL_TABLET | Freq: Every day | ORAL | 0 refills | Status: DC

## 2022-02-16 MED ORDER — TRAMADOL HCL 50 MG PO TABS: 50.0000 mg | ORAL_TABLET | Freq: Every day | ORAL | 2 refills | Status: DC | PRN

## 2022-02-16 NOTE — Assessment & Plan Note (Signed)
Completed lipid panel results pending.  No changes to current medication dose.  Patient has no new complaints follow-up in 3 months.  Labs completed-lipid panel, CMP, CBC.

## 2022-02-16 NOTE — Progress Notes (Signed)
Established Patient Office Visit  Subjective:  Patient ID: [name], female    DOB: January 18, 1951  Age: 71 y.o. MRN: [id number]  CC:  Chief Complaint  Patient presents with   chronic disease management    HPI Analysia B Podgurski presents for follow up of hypertension. Patient was diagnosed in 09/17/2020. The patient is tolerating the medication well without side effects. Compliance with treatment has been good; including taking medication as directed , maintains a healthy diet and regular exercise regimen , and following up as directed.   Mixed hyperlipidemia  Pt presents with hyperlipidemia. Patient was diagnosed in 04/27/2013.Marland Kitchen Compliance with treatment has been good; The patient is compliant with medications, maintains a low cholesterol diet , follows up as directed , and maintains an exercise regimen . The patient denies experiencing any hypercholesterolemia related symptoms.    Past Medical History:  Diagnosis Date   Arthritis    "fingers, some in my knees" (01/28/2016)   CAD (coronary artery disease), per cath 04/27/13, non obstructive disease, EF 65-70% 05/11/2013   Carotid disease, bilateral ([place]) 09/20/2013   Lt carotid 50-69% stentosis, less on rt   Dysrhythmia    Fatty liver    GERD (gastroesophageal reflux disease)    Glaucoma    H/O cardiovascular stress test 12/27/2009   negative for ischemia   Headache    "occasional since eye pressure regulated" (01/28/2016)   Hyperlipidemia    Hypertension    Leukocytosis 07/15/2015   Migraine    "stopped w/laser holes to relieve pressure in my eyes; had them 3-4 times/wk before taht" (01/28/2016)   Mitral regurgitation,2+ by cath 05/11/2013   NSTEMI (non-ST elevated myocardial infarction) ([place]) 04/27/2013   Pain    BOTH KNEES - PT HAS TORN MENISCUS LEFT KNEE   Paroxysmal atrial fibrillation (HCC)    hx of a fib on ASA  AND ELIQUIS    Polycythemia vera ([place]) 08/20/2015   JAK-2 positive 07/19/15   PVD (peripheral vascular disease) with  claudication ([place]) 07/21/2006   previous L ext iliac stent and rt SFA stent, known occluded Lt SFA   S/P cardiac cath 04/27/2013   NON OBSTRUCTIVE DISEASE, 2+ mr, ef 65-70%   Statin intolerance    Stroke ([place]) 12/21/2004   SWELLING ALL OVER AND RT SIDE OF FACE DRAWN AND SPEECH SLURRED AND NUMBNESS ON RIGHT SIDE-- ALL RESOLVED   TIA (transient ischemic attack)     Past Surgical History:  Procedure Laterality Date   AMPUTATION Right 05/21/2017   Procedure: AMPUTATION ABOVE KNEE;  Surgeon: Rosetta Posner, MD;  Location: [facility];  Service: Vascular;  Laterality: Right;   CARDIAC CATHETERIZATION  04/27/13   non occlusive disease with mild to mod. calcified lesions in ostial LM and proximal LAD and moderate prox. RC AND DISTAL AV GROOVE LCX, ef   CATARACT EXTRACTION W/PHACO Right 03/21/2015   Procedure: CATARACT EXTRACTION PHACO AND INTRAOCULAR LENS PLACEMENT RIGHT EYE;  Surgeon: Baruch Goldmann, MD;  Location: [facility];  Service: Ophthalmology;  Laterality: Right;  CDE:5.10   CATARACT EXTRACTION W/PHACO Left 05/16/2015   Procedure: CATARACT EXTRACTION PHACO AND INTRAOCULAR LENS PLACEMENT (IOC);  Surgeon: Baruch Goldmann, MD;  Location: [facility];  Service: Ophthalmology;  Laterality: Left;  CDE 5.57   CESAREAN SECTION  1977   COLONOSCOPY N/A 09/11/2016   Procedure: COLONOSCOPY;  Surgeon: Rogene Houston, MD;  [facility];  Service: Endoscopy;  Laterality: N/A;  730-moved to 1:00 Ann notified pt   EMBOLECTOMY Right 02/01/2016  Procedure: Thrombectomy of Right Femoral-Popliteal Bypass Graft; Endarterectomy of Right Below Knee Popliteal Artery and Tibial Peroneal Trunk with Bovine Pericardium Patch Angioplasty.;  Surgeon: Angelia Mould, MD;  Location: [facility];  Service: Vascular;  Laterality: Right;   FEMORAL-POPLITEAL BYPASS GRAFT Right 01/31/2016   Procedure: BYPASS GRAFT RIGHT COMMON  FEMORALTO BELOW KNEE POPLITEAL ARTERY BYPASS GRAFT USING 6MM PROPATEN GORTEX GRAFT;  Surgeon: Mal Misty, MD;   Location: [facility];  Service: Vascular;  Laterality: Right;   FEMORAL-POPLITEAL BYPASS GRAFT Right 01/09/2017   Procedure: THROMBECTOMY OF RIGHT FEMORAL-POPLITEAL  ARTERY BYPASS GRAFT; THROMBECTOMY RIGHT  TIBIAL VESSELS;  Surgeon: [name], MD;  Location: [facility];  Service: Vascular;  Laterality: Right;   FEMORAL-POPLITEAL BYPASS GRAFT Right 05/17/2017   Procedure: RIGHT FEMORAL-TIBIAL PERONEAL TRUNK ARTERY BYPASS GRAFT USING 75mX80cm PROPATEN GRAFT WITH REMOVABLE RING;  Surgeon: ERosetta Posner MD;  [facility]  Service: Vascular;  Laterality: Right;   FRACTURE SURGERY     ILIAC ARTERY STENT  07/2006   external iliac and Rt SFA stent 07/2006 AND THE LEFT WAS IN 2006   KNEE ARTHROSCOPY WITH MEDIAL MENISECTOMY Left 03/27/2014   Procedure: left knee arthorscopy with medial chondraplasty of the medial femoral and patella, medial microfracture technique of medial femoral condyl;  Surgeon: RTobi Bastos MD;  [facility];  Service: Orthopedics;  Laterality: Left;   LEFT HEART CATHETERIZATION WITH CORONARY ANGIOGRAM Right 04/27/2013   Procedure: LEFT HEART CATHETERIZATION WITH CORONARY ANGIOGRAM;  Surgeon: DLeonie Man MD;  [facility];  Service: Cardiovascular;  Laterality: Right;   LOWER EXTREMITY ANGIOGRAM Right 01/31/2016   Procedure: INTRAOP RIGHT LOWER EXTREMITY ANGIOGRAM;  Surgeon: JMal Misty MD;  [facility]  Service: Vascular;  Laterality: Right;   ORIF WRIST FRACTURE Right 1983   OVARIAN CYST REMOVAL Right    PATCH ANGIOPLASTY Right 01/31/2016   Procedure: RIGHT COMMON FEMORAL AND PROFUNDA FEMORIS ENDARECTOMY WITH PATCH ANGIOPLASTY;  Surgeon: JMal Misty MD;  [facility]  Service: Vascular;  Laterality: Right;   PATCH ANGIOPLASTY Right 01/09/2017   Procedure: PATCH ANGIOPLASTY RIGHT POPLITEAL ARTERY BYPASS GRAFT;  Surgeon: CElam Dutch MD;  [facility]  Service: Vascular;  Laterality: Right;   PERIPHERAL VASCULAR CATHETERIZATION N/A 06/27/2015    Procedure: Lower Extremity Angiography;  Surgeon: JLorretta Harp MD;  [facility];  Service: Cardiovascular;  Laterality: N/A;   PERIPHERAL VASCULAR CATHETERIZATION Bilateral 01/29/2016   Procedure: Lower Extremity Angiography;  Surgeon: MWellington Hampshire MD;  [facility];  Service: Cardiovascular;  Laterality: Bilateral;   PERIPHERAL VASCULAR CATHETERIZATION N/A 01/29/2016   Procedure: Abdominal Aortogram;  Surgeon: MWellington Hampshire MD;  [facility];  Service: Cardiovascular;  Laterality: N/A;   REFRACTIVE SURGERY Bilateral    "6 in one eye; 7 in the other; to relieve pressure; not glaucoma" (01/28/2016)   SFA Right 06/27/2015   overlapping Bahn covered stents   [place] 05/17/2017   Procedure: LEFT LEG GREATER SAPHENOUS VEIN HARVEST;  Surgeon: ERosetta Posner MD;  [facility]  Service: Vascular;  Laterality: Left;   VEIN REPAIR Right 01/31/2016   Procedure: RIGHT GREATER SAPHENOUS VEIN EXAMNED BUT NOT REMOVED;  Surgeon: JMal Misty MD;  [facility];  Service: Vascular;  Laterality: Right;    Family History  Problem Relation Age of Onset   CAD Mother    Lung  cancer Mother    Heart attack Mother    CAD Father    Heart disease Father    Heart attack Father 39       died in hes 36's with heart disease   Healthy Sister    Liver cancer Brother    Healthy Sister    CAD Brother        has had CABG   CAD Brother     Social History   Socioeconomic History   Marital status: Widowed    Spouse name: Not on file   Number of children: 2   Years of education: 60   Highest education level: Not on file  Occupational History   Occupation: CNA  Tobacco Use   Smoking status: Former    Packs/day: 1.00    Years: 45.00    Pack years: 45.00    Types: Cigarettes    Quit date: 11/11/2012    Years since quitting: 9.2   Smokeless tobacco: Never   Tobacco comments:    quit 4 years ago.    10/27/21-Pt  declines lung ca screen at this time.   Vaping Use   Vaping Use: Never used  Substance and Sexual Activity   Alcohol use: No    Alcohol/week: 0.0 standard drinks   Drug use: No   Sexual activity: Not Currently    Birth control/protection: None  Other Topics Concern   Not on file  Social History Narrative   Husband passed away after CVA in March 05, 2005.   2 children, 5 grandchildren and 1 great grandchild.   Social Determinants of Health   Financial Resource Strain: Low Risk    Difficulty of Paying Living Expenses: Not hard at all  Food Insecurity: No Food Insecurity   Worried About Charity fundraiser in the Last Year: Never true   [place] in the Last Year: Never true  Transportation Needs: No Transportation Needs   Lack of Transportation (Medical): No   Lack of Transportation (Non-Medical): No  Physical Activity: Insufficiently Active   Days of Exercise per Week: 3 days   Minutes of Exercise per Session: 30 min  Stress: No Stress Concern Present   Feeling of Stress : Not at all  Social Connections: Moderately Integrated   Frequency of Communication with Friends and Family: More than three times a week   Frequency of Social Gatherings with Friends and Family: More than three times a week   Attends Religious Services: 1 to 4 times per year   Active Member of [employer] or Organizations: Yes   Attends Archivist Meetings: 1 to 4 times per year   Marital Status: Widowed  Human resources officer Violence: Not At Risk   Fear of Current or Ex-Partner: No   Emotionally Abused: No   Physically Abused: No   Sexually Abused: No    Outpatient Medications Prior to Visit  Medication Sig Dispense Refill   acetaminophen (TYLENOL) 500 MG tablet Take 2 tablets (1,000 mg total) by mouth every 6 (six) hours. 30 tablet 0   ARTIFICIAL TEAR OP Place 1 drop into both eyes as needed (for dry eyes).     aspirin EC 325 MG tablet Take 325 mg by mouth daily.     calcium-vitamin D (OSCAL WITH D)  500-200 MG-UNIT tablet Take 1 tablet by mouth daily with breakfast. 90 tablet 1   diclofenac Sodium (VOLTAREN) 1 % GEL Apply 2 g topically 4 (four) times daily. 50 g 0   diphenhydrAMINE (  BENADRYL) 25 MG tablet Take 25 mg by mouth every 6 (six) hours as needed.     doxycycline (VIBRA-TABS) 100 MG tablet Take 1 tablet (100 mg total) by mouth 2 (two) times daily. 14 tablet 0   ELIQUIS 5 MG TABS tablet TAKE ONE TABLET TWICE DAILY 60 tablet 0   flecainide (TAMBOCOR) 50 MG tablet TAKE 1 TABLET 2 TIMES A DAY 60 tablet 2   hydroxyurea (HYDREA) 500 MG capsule Take 1 capsule (500 mg total) by mouth daily. Take one capsule daily except Sundays. May take with food to minimize GI side effects. 30 capsule 3   omeprazole (PRILOSEC) 20 MG capsule TAKE ONE CAPSULE TWICE DAILY BEFORE MEALS 180 capsule 0   valsartan (DIOVAN) 160 MG tablet Take 160 mg every afternoon 90 tablet 3   valsartan (DIOVAN) 320 MG tablet Take 320 mg every morning 90 tablet 3   diltiazem (CARDIZEM CD) 240 MG 24 hr capsule TAKE 1 CAPSULE EVERY MORNING 90 capsule 0   escitalopram (LEXAPRO) 20 MG tablet TAKE ONE TABLET ONCE DAILY 30 tablet 0   ezetimibe (ZETIA) 10 MG tablet TAKE ONE TABLET ONCE DAILY 30 tablet 5   fenofibrate (TRICOR) 145 MG tablet Take 1 tablet (145 mg total) by mouth daily. (NEEDS TO BE SEEN BEFORE NEXT REFILL) 30 tablet 0   traMADol (ULTRAM) 50 MG tablet Take 50 mg by mouth daily as needed for moderate pain.     No facility-administered medications prior to visit.    Allergies  Allergen Reactions   Atenolol Rash and Itching   Demerol  [Meperidine Hcl] Itching   Demerol [Meperidine] Other (See Comments) and Itching    Unknown, can't remember  Unknown, can't remember    Gabapentin Anxiety and Other (See Comments)    SI and HI   Inderal [Propranolol] Other (See Comments) and Itching    Doesn't remember    Prednisone Other (See Comments)    Muscle spasms   Statins Itching and Other (See Comments)     Simvastatin-caused severe itching Pravastatin-caused lesser tiching One type of statin? Caused stroke in 2006, and other symptoms as result   Warfarin And Related Other (See Comments)    Caused nose bleeds   Crestor [Rosuvastatin] Itching and Rash   Docosahexaenoic Acid (Dha)     Other reaction(s): Other (See Comments) nosebleeds   Lactose Intolerance (Gi) Other (See Comments)    Bloating, gas   Lipitor [Atorvastatin] Rash   Lovaza [Omega-3-Acid Ethyl Esters] Other (See Comments)    nosebleeds    Penicillins Hives, Rash and Other (See Comments)    Has patient had a PCN reaction causing immediate rash, facial/tongue/throat swelling, SOB or lightheadedness with hypotension: Yes Has patient had a PCN reaction causing severe rash involving mucus membranes or skin necrosis: No Has patient had a PCN reaction that required hospitalization: No Has patient had a PCN reaction occurring within the last 10 years: No If all of the above answers are "NO", then may proceed with Cephalosporin use.  Questionable high fever    Pravastatin Itching and Rash   Simvastatin Itching, Rash and Other (See Comments)   Trilipix [Choline Fenofibrate] Other (See Comments)    Doesn't remember    Warfarin Other (See Comments)    nosebleeds   Hydralazine Swelling   Effexor [Venlafaxine Hcl] Other (See Comments)    Doesn't remember    Nexium [Esomeprazole Magnesium] Rash   Percocet [Oxycodone-Acetaminophen] Other (See Comments)    Doesn't remember    Plavix [Clopidogrel  Bisulfate] Rash    ROS Review of Systems  Constitutional: Negative.   HENT: Negative.    Eyes: Negative.   Respiratory: Negative.    Gastrointestinal: Negative.   Genitourinary: Negative.   Musculoskeletal:        Prosthetic leg, patient uses a walker  Skin: Negative.  Negative for rash.  All other systems reviewed and are negative.    Objective:    Physical Exam Vitals and nursing note reviewed.  Constitutional:       Appearance: Normal appearance.  HENT:     Head: Normocephalic.     Right Ear: External ear normal.     Left Ear: External ear normal.     Nose: Nose normal.     Mouth/Throat:     Mouth: Mucous membranes are moist.     Pharynx: Oropharynx is clear.  Eyes:     Conjunctiva/sclera: Conjunctivae normal.  Cardiovascular:     Rate and Rhythm: Normal rate and regular rhythm.     Pulses: Normal pulses.  Pulmonary:     Effort: Pulmonary effort is normal.     Breath sounds: Normal breath sounds.  Abdominal:     General: Bowel sounds are normal.  Musculoskeletal:     Right elbow: Swelling and deformity present. Decreased range of motion.     Left elbow: Swelling and deformity present. Decreased range of motion.       Arms:     Comments: Ganglion cyst present bilateral elbow.   Skin:    General: Skin is warm.     Findings: No rash.  Neurological:     Mental Status: She is alert and oriented to person, place, and time.    BP 101/63    Pulse 72    Temp 98.5 F (36.9 C)    Ht 5' 4.5" (1.638 m)    Wt 136 lb (61.7 kg)    SpO2 96%    BMI 22.98 kg/m  Wt Readings from Last 3 Encounters:  02/16/22 136 lb (61.7 kg)  01/19/22 133 lb (60.3 kg)  01/16/22 133 lb 2 oz (60.4 kg)     There are no preventive care reminders to display for this patient.  There are no preventive care reminders to display for this patient.  Lab Results  Component Value Date   TSH 1.550 06/28/2020   Lab Results  Component Value Date   WBC 15.7 (H) 01/09/2022   HGB 11.5 (L) 01/09/2022   HCT 39.7 01/09/2022   MCV 80.4 01/09/2022   PLT 353 01/09/2022   Lab Results  Component Value Date   NA 140 12/09/2021   K 5.1 12/09/2021   CO2 21 12/09/2021   GLUCOSE 69 (L) 12/09/2021   BUN 18 12/09/2021   CREATININE 0.99 12/09/2021   BILITOT 0.6 11/04/2021   ALKPHOS 57 11/04/2021   AST 27 11/04/2021   ALT 16 11/04/2021   PROT 5.8 (L) 11/04/2021   ALBUMIN 2.8 (L) 11/04/2021   CALCIUM 8.1 (L) 12/09/2021    ANIONGAP 8 11/04/2021   EGFR 61 12/09/2021   Lab Results  Component Value Date   CHOL 173 11/04/2021   Lab Results  Component Value Date   HDL 35 (L) 11/04/2021   Lab Results  Component Value Date   LDLCALC 99 11/04/2021   Lab Results  Component Value Date   TRIG 193 (H) 11/04/2021   Lab Results  Component Value Date   CHOLHDL 4.9 11/04/2021   Lab Results  Component Value Date   HGBA1C  4.9 11/04/2021      Assessment & Plan:  Completed referral to general surgery to reassess bilateral elbow ganglion cyst.  Symptoms present for the past few months.  Patient presents with tenderness, swelling and mild irritation. Problem List Items Addressed This Visit       Cardiovascular and Mediastinum   NSTEMI (non-ST elevated myocardial infarction) (HCC)   Relevant Medications   diltiazem (CARDIZEM CD) 240 MG 24 hr capsule   ezetimibe (ZETIA) 10 MG tablet   fenofibrate (TRICOR) 145 MG tablet   Essential hypertension - Primary   Relevant Medications   diltiazem (CARDIZEM CD) 240 MG 24 hr capsule   ezetimibe (ZETIA) 10 MG tablet   fenofibrate (TRICOR) 145 MG tablet   Benign essential HTN    Hypertension well-controlled on current medication.  Patient reports that she has been consistently taking her medication as prescribed.  No changes necessary.  Rx refill sent to pharmacy.      Relevant Medications   diltiazem (CARDIZEM CD) 240 MG 24 hr capsule   ezetimibe (ZETIA) 10 MG tablet   fenofibrate (TRICOR) 145 MG tablet     Digestive   Gastroesophageal reflux disease without esophagitis    GERD well controlled on current medication dose, no changes needed.  Patient does not express any worsening signs and symptoms.        Musculoskeletal and Integument   Osteoarthritis of left knee   Relevant Medications   fenofibrate (TRICOR) 145 MG tablet   traMADol (ULTRAM) 50 MG tablet     Other   Hyperlipidemia    Completed lipid panel results pending.  No changes to current  medication dose.  Patient has no new complaints follow-up in 3 months.  Labs completed-lipid panel, CMP, CBC.      Relevant Medications   diltiazem (CARDIZEM CD) 240 MG 24 hr capsule   ezetimibe (ZETIA) 10 MG tablet   fenofibrate (TRICOR) 145 MG tablet   Other Visit Diagnoses     History of CVA (cerebrovascular accident)       Relevant Medications   ezetimibe (ZETIA) 10 MG tablet   Other bursal cyst, left elbow       Other bursal cyst, right elbow       Ganglion cyst       Relevant Orders   Ambulatory referral to General Surgery       Meds ordered this encounter  Medications   diltiazem (CARDIZEM CD) 240 MG 24 hr capsule    Sig: Take 1 capsule (240 mg total) by mouth every morning.    Dispense:  90 capsule    Refill:  0   escitalopram (LEXAPRO) 20 MG tablet    Sig: Take 1 tablet (20 mg total) by mouth daily.    Dispense:  30 tablet    Refill:  0   ezetimibe (ZETIA) 10 MG tablet    Sig: Take 1 tablet (10 mg total) by mouth daily.    Dispense:  30 tablet    Refill:  5   fenofibrate (TRICOR) 145 MG tablet    Sig: Take 1 tablet (145 mg total) by mouth daily. (NEEDS TO BE SEEN BEFORE NEXT REFILL)    Dispense:  30 tablet    Refill:  0   traMADol (ULTRAM) 50 MG tablet    Sig: Take 1 tablet (50 mg total) by mouth daily as needed for moderate pain.    Dispense:  30 tablet    Refill:  2   mupirocin cream (BACTROBAN) 2 %  Sig: Apply 1 application topically 2 (two) times daily.    Dispense:  15 g    Refill:  0    Order Specific Question:   Supervising Provider    Answer:   Claretta Fraise [[id number]]    Follow-up: Return in about 3 months (around 05/16/2022).    Ivy Lynn, NP

## 2022-02-16 NOTE — Assessment & Plan Note (Signed)
Hypertension well-controlled on current medication.  Patient reports that she has been consistently taking her medication as prescribed.  No changes necessary.  Rx refill sent to pharmacy.

## 2022-02-16 NOTE — Assessment & Plan Note (Signed)
>>  ASSESSMENT AND PLAN FOR MIXED HYPERLIPIDEMIA WRITTEN ON 02/16/2022  5:02 PM BY IJAOLA, ONYEJE M, NP  Completed lipid panel results pending.  No changes to current medication dose.  Patient has no new complaints follow-up in 3 months.  Labs completed-lipid panel, CMP, CBC.

## 2022-02-16 NOTE — Assessment & Plan Note (Signed)
GERD well controlled on current medication dose, no changes needed.  Patient does not express any worsening signs and symptoms.

## 2022-02-16 NOTE — Patient Instructions (Addendum)
Hypertension, Adult Hypertension is another name for high blood pressure. High blood pressure forces your heart to work harder to pump blood. This can cause problems over time. There are two numbers in a blood pressure reading. There is a top number (systolic) over a bottom number (diastolic). It is best to have a blood pressure that is below 120/80. Healthy choices can help lower your blood pressure, or you may need medicine to help lower it. What are the causes? The cause of this condition is not known. Some conditions may be related to high blood pressure. What increases the risk? Smoking.9Drinking too much alcohol. Having long-term (chronic) kidney disease. Having a family history of high blood pressure. Age. Risk increases with age. Race. You may be at higher risk if you are African American. Gender. Men are at higher risk than women before age 43. After age 23, women are at higher risk than men. Having obstructive sleep apnea. Stress. What are the signs or symptoms? High blood pressure may not cause symptoms. Very high blood pressure (hypertensive crisis) may cause: Headache. Feelings of worry or nervousness (anxiety). Shortness of breath. Nosebleed. A feeling of being sick to your stomach (nausea). Throwing up (vomiting). Changes in how you see. Very bad chest pain. Seizures. How is this treated? This condition is treated by making healthy lifestyle changes, such as: Eating healthy foods. Exercising more. Drinking less alcohol. Your health care provider may prescribe medicine if lifestyle changes are not enough to get your blood pressure under control, and if: Your top number is above 130. Your bottom number is above 80. Your personal target blood pressure may vary. Follow these instructions at home: Eating and drinking  If told, follow the DASH eating plan. To follow this plan: Fill one half of your plate at each meal with fruits and vegetables. Fill one fourth of your  plate at each meal with whole grains. Whole grains include whole-wheat pasta, brown rice, and whole-grain bread. Eat or drink low-fat dairy products, such as skim milk or low-fat yogurt. Fill one fourth of your plate at each meal with low-fat (lean) proteins. Low-fat proteins include fish, chicken without skin, eggs, beans, and tofu. Avoid fatty meat, cured and processed meat, or chicken with skin. Avoid pre-made or processed food. Eat less than 1,500 mg of salt each day. Do not drink alcohol if: Your doctor tells you not to drink. You are pregnant, may be pregnant, or are planning to become pregnant. If you drink alcohol: Limit how much you use to: 0-1 drink a day for women. 0-2 drinks a day for men. Be aware of how much alcohol is in your drink. In the [place] one drink equals one 12 oz bottle of beer (355 mL), one 5 oz glass of wine (148 mL), or one 1 oz glass of hard liquor (44 mL). Lifestyle  Work with your doctor to stay at a healthy weight or to lose weight. Ask your doctor what the best weight is for you. Get at least 30 minutes of exercise most days of the week. This may include walking, swimming, or biking. Get at least 30 minutes of exercise that strengthens your muscles (resistance exercise) at least 3 days a week. This may include lifting weights or doing Pilates. Do not use any products that contain nicotine or tobacco, such as cigarettes, e-cigarettes, and chewing tobacco. If you need help quitting, ask your doctor. Check your blood pressure at home as told by your doctor. Keep all follow-up visits as told  by your doctor. This is important. Medicines Take over-the-counter and prescription medicines only as told by your doctor. Follow directions carefully. Do not skip doses of blood pressure medicine. The medicine does not work as well if you skip doses. Skipping doses also puts you at risk for problems. Ask your doctor about side effects or reactions to medicines that you  should watch for. Contact a doctor if you: Think you are having a reaction to the medicine you are taking. Have headaches that keep coming back (recurring). Feel dizzy. Have swelling in your ankles. Have trouble with your vision. Get help right away if you: Get a very bad headache. Start to feel mixed up (confused). Feel weak or numb. Feel faint. Have very bad pain in your: Chest. Belly (abdomen). Throw up more than once. Have trouble breathing. Summary Hypertension is another name for high blood pressure. High blood pressure forces your heart to work harder to pump blood. For most people, a normal blood pressure is less than 120/80. Making healthy choices can help lower blood pressure. If your blood pressure does not get lower with healthy choices, you may need to take medicine. This information is not intended to replace advice given to you by your health care provider. Make sure you discuss any questions you have with your health care provider. Document Revised: 08/17/2018 Document Reviewed: 08/17/2018 Elsevier Patient Education  [place]. Cholesterol Content in Foods Cholesterol is a waxy, fat-like substance that helps to carry fat in the blood. The body needs cholesterol in small amounts, but too much cholesterol can cause damage to the arteries and heart. What foods have cholesterol? Cholesterol is found in animal-based foods, such as meat, seafood, and dairy. Generally, low-fat dairy and lean meats have less cholesterol than full-fat dairy and fatty meats. The milligrams of cholesterol per serving (mg per serving) of common cholesterol-containing foods are listed below. Meats and other proteins Egg -- one large whole egg has 186 mg. Veal shank -- 4 oz (113 g) has 141 mg. Lean ground [place] (93% lean) -- 4 oz (113 g) has 118 mg. Fat-trimmed lamb loin -- 4 oz (113 g) has 106 mg. Lean ground beef (90% lean) -- 4 oz (113 g) has 100 mg. Lobster -- 3.5 oz (99 g) has 90  mg. Pork loin chops -- 4 oz (113 g) has 86 mg. Canned salmon -- 3.5 oz (99 g) has 83 mg. Fat-trimmed beef top loin -- 4 oz (113 g) has 78 mg. Frankfurter -- 1 frank (3.5 oz or 99 g) has 77 mg. Crab -- 3.5 oz (99 g) has 71 mg. Roasted chicken without skin, white meat -- 4 oz (113 g) has 66 mg. Light bologna -- 2 oz (57 g) has 45 mg. Deli-cut [place] -- 2 oz (57 g) has 31 mg. Canned tuna -- 3.5 oz (99 g) has 31 mg. Berniece Salines -- 1 oz (28 g) has 29 mg. Oysters and mussels (raw) -- 3.5 oz (99 g) has 25 mg. Mackerel -- 1 oz (28 g) has 22 mg. Trout -- 1 oz (28 g) has 20 mg. Pork sausage -- 1 link (1 oz or 28 g) has 17 mg. Salmon -- 1 oz (28 g) has 16 mg. Tilapia -- 1 oz (28 g) has 14 mg. Dairy Soft-serve ice cream --  cup (4 oz or 86 g) has 103 mg. Whole-milk yogurt -- 1 cup (8 oz or 245 g) has 29 mg. Cheddar cheese -- 1 oz (28 g) has 28 mg.  American cheese -- 1 oz (28 g) has 28 mg. Whole milk -- 1 cup (8 oz or 250 mL) has 23 mg. 2% milk -- 1 cup (8 oz or 250 mL) has 18 mg. Cream cheese -- 1 tablespoon (Tbsp) (14.5 g) has 15 mg. Cottage cheese --  cup (4 oz or 113 g) has 14 mg. Low-fat (1%) milk -- 1 cup (8 oz or 250 mL) has 10 mg. Sour cream -- 1 Tbsp (12 g) has 8.5 mg. Low-fat yogurt -- 1 cup (8 oz or 245 g) has 8 mg. Nonfat Greek yogurt -- 1 cup (8 oz or 228 g) has 7 mg. Half-and-half cream -- 1 Tbsp (15 mL) has 5 mg. Fats and oils Cod liver oil -- 1 tablespoon (Tbsp) (13.6 g) has 82 mg. Butter -- 1 Tbsp (14 g) has 15 mg. Lard -- 1 Tbsp (12.8 g) has 14 mg. Bacon grease -- 1 Tbsp (12.9 g) has 14 mg. Mayonnaise -- 1 Tbsp (13.8 g) has 5-10 mg. Margarine -- 1 Tbsp (14 g) has 3-10 mg. The items listed above may not be a complete list of foods with cholesterol. Exact amounts of cholesterol in these foods may vary depending on specific ingredients and brands. Contact a dietitian for more information. What foods do not have cholesterol? Most plant-based foods do not have cholesterol unless  you combine them with a food that has cholesterol. Foods without cholesterol include: Grains and cereals. Vegetables. Fruits. Vegetable oils, such as olive, canola, and sunflower oil. Legumes, such as peas, beans, and lentils. Nuts and seeds. Egg whites. The items listed above may not be a complete list of foods that do not have cholesterol. Contact a dietitian for more information. Summary The body needs cholesterol in small amounts, but too much cholesterol can cause damage to the arteries and heart. Cholesterol is found in animal-based foods, such as meat, seafood, and dairy. Generally, low-fat dairy and lean meats have less cholesterol than full-fat dairy and fatty meats. This information is not intended to replace advice given to you by your health care provider. Make sure you discuss any questions you have with your health care provider. Document Revised: 04/18/2021 Document Reviewed: 04/18/2021 Elsevier Patient Education  [place].

## 2022-02-17 ENCOUNTER — Telehealth: Payer: Self-pay | Admitting: Nurse Practitioner

## 2022-02-18 ENCOUNTER — Other Ambulatory Visit: Payer: Self-pay | Admitting: Nurse Practitioner

## 2022-02-18 DIAGNOSIS — T148XXD Other injury of unspecified body region, subsequent encounter: Secondary | ICD-10-CM

## 2022-02-18 MED ORDER — DOXYCYCLINE HYCLATE 100 MG PO TABS
100.0000 mg | ORAL_TABLET | Freq: Two times a day (BID) | ORAL | 0 refills | Status: DC
Start: 2022-02-18 — End: 2022-07-14

## 2022-02-18 NOTE — Telephone Encounter (Signed)
Patient's OV was a 3 months follow up, we also discussed chronic leg scab, which she asked for refill of Bactroban cream. patient prefers to silvadene currently been used. I encouraged patient to go to wound center but she declined. Patient wants to give leg a few more weeks before going to wound care.  patient and I did not discuss antibiotics. May be she is talking about her Bactroban refill which I already sent yesterday. ? ? ?

## 2022-02-18 NOTE — Telephone Encounter (Signed)
That was not the conversation.  ? ?I started patient on doxycycline 01/16/22 for 7 days, it was completed 01/24/2022 she completed medication 3 weeks ago, I assessed her leg at OV and wanted patient to follow up with wound care. The Doxy is not working and patient is allergic to keflex.  ? ?I will resend another 7 days dose of doxycycline to [place].  I also put in a wound care consult.  ? ?Let me know if there is anything I can do to facilitate your wound healing. ? ?Thank you ?

## 2022-02-18 NOTE — Telephone Encounter (Signed)
Patient said she thought you were going to refill the Doxycycline also and she was going to use the Bactroban also.  And then if these two things did not help she would have you place a referral to the wound center.  Please advise. ?

## 2022-02-19 NOTE — Telephone Encounter (Signed)
Patient aware and verbalized understanding. °

## 2022-02-20 ENCOUNTER — Inpatient Hospital Stay: Payer: 59 | Admitting: Hematology

## 2022-02-20 ENCOUNTER — Inpatient Hospital Stay: Payer: 59

## 2022-02-28 ENCOUNTER — Other Ambulatory Visit: Payer: Self-pay | Admitting: Nurse Practitioner

## 2022-02-28 DIAGNOSIS — I482 Chronic atrial fibrillation, unspecified: Secondary | ICD-10-CM

## 2022-03-03 ENCOUNTER — Ambulatory Visit (INDEPENDENT_AMBULATORY_CARE_PROVIDER_SITE_OTHER): Payer: 59 | Admitting: Cardiovascular Disease

## 2022-03-03 ENCOUNTER — Encounter: Payer: Self-pay | Admitting: Cardiovascular Disease

## 2022-03-03 ENCOUNTER — Other Ambulatory Visit: Payer: Self-pay

## 2022-03-03 VITALS — BP 103/62 | HR 68 | Ht 64.5 in | Wt 134.4 lb

## 2022-03-03 DIAGNOSIS — I739 Peripheral vascular disease, unspecified: Secondary | ICD-10-CM

## 2022-03-03 DIAGNOSIS — I480 Paroxysmal atrial fibrillation: Secondary | ICD-10-CM

## 2022-03-03 DIAGNOSIS — I6529 Occlusion and stenosis of unspecified carotid artery: Secondary | ICD-10-CM | POA: Diagnosis not present

## 2022-03-03 DIAGNOSIS — I2510 Atherosclerotic heart disease of native coronary artery without angina pectoris: Secondary | ICD-10-CM | POA: Diagnosis not present

## 2022-03-03 DIAGNOSIS — E782 Mixed hyperlipidemia: Secondary | ICD-10-CM

## 2022-03-03 NOTE — Assessment & Plan Note (Signed)
History of PAF maintaining sinus rhythm on Eliquis oral anticoagulation.  Apparently she had a small stroke in the fall of last year which she has recovered from.  Carotid Doppler studies reveal moderate left and mild right ICA stenosis which will be repeated on an annual basis. ?

## 2022-03-03 NOTE — Assessment & Plan Note (Signed)
History of hyperlipidemia on fenofibrate and Zetia intolerant to statin therapy.  Her most recent lipid profile performed 11/04/2021 revealed total cholesterol 173, LDL of 99 and HDL 35. ?

## 2022-03-03 NOTE — Assessment & Plan Note (Signed)
History of CAD status post cardiac catheterization 04/27/2013 revealing nonobstructive CAD with normal LV function.  This was performed by Dr. Ellyn Hack.  She denies chest pain or shortness of breath. ?
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
## 2022-03-03 NOTE — Assessment & Plan Note (Signed)
History of PAD status post remote left iliac stenting with a known occluded left SFA, occluded right SFA with critical ischemia and placement of 2 Viabahn Von overlapping stents by myself 06/27/2015.  She was restudied by Dr. Fletcher Anon 01/29/2016 in setting of chronic limb ischemia revealing occluded Viabahn stents and underwent urgent right femoropopliteal bypass grafting by Dr. Kellie Simmering.  Ultimately this thrombosed requiring thrombectomy by Dr. Oneida Alar 01/09/2017.  She ultimately underwent right BKA.  She currently has a prosthesis, walks with a walker and denies claudication.  Her most recent Doppler studies reveal significant disease in both iliac arteries.  These will be continue to follow her noninvasively. ?

## 2022-03-03 NOTE — Patient Instructions (Signed)
Medication Instructions:  ?Your physician recommends that you continue on your current medications as directed. Please refer to the Current Medication list given to you today. ? ?*If you need a refill on your cardiac medications before your next appointment, please call your pharmacy* ? ? ?Testing/Procedures: ?Your physician has requested that you have a carotid duplex. This test is an ultrasound of the carotid arteries in your neck. It looks at blood flow through these arteries that supply the brain with blood. Allow one hour for this exam. There are no restrictions or special instructions. ? ?Dr. [name] has recommended that you have an Ultrasound of your AORTA/IVC/ILIACS.  ? ?To prepare for this test: ? ?No food after 11PM the night before. Water is OK. (Don't drink liquids if you have been instructed not to for ANOTHER test). Avoid foods that produce bowel gas, for 24 hours prior to exam (see below). ?No breakfast, no chewing gum, no smoking or carbonated beverages. ?Patient may take morning medications with water. ?Come in for test at least 15 minutes early to register. ? ?Your physician has requested that you have an ankle brachial index (ABI). During this test an ultrasound and blood pressure cuff are used to evaluate the arteries that supply the arms and legs with blood. Allow thirty minutes for this exam. There are no restrictions or special instructions. ?To be done in December. These procedures will be done at [place] ? ? ?Follow-Up: ?At [facility], you and your health needs are our priority.  As part of our continuing mission to provide you with exceptional heart care, we have created designated Provider Care Teams.  These Care Teams include your primary Cardiologist (physician) and Advanced Practice Providers (APPs -  Physician Assistants and Nurse Practitioners) who all work together to provide you with the care you need, when you need it. ? ?We recommend signing up for the  patient portal called "MyChart".  Sign up information is provided on this After Visit Summary.  MyChart is used to connect with patients for Virtual Visits (Telemedicine).  Patients are able to view lab/test results, encounter notes, upcoming appointments, etc.  Non-urgent messages can be sent to your provider as well.   ?To learn more about what you can do with MyChart, go to [email or web address].   ? ?Your next appointment:   ?6 month(s) ? ?The format for your next appointment:   ?In Person ? ?Provider:   ?Coletta Memos, FNP     ? ? ?Then, Quay Burow, MD will plan to see you again in 12 month(s). ?

## 2022-03-03 NOTE — Assessment & Plan Note (Signed)
>>  ASSESSMENT AND PLAN FOR MIXED HYPERLIPIDEMIA WRITTEN ON 03/03/2022 11:01 AM BY BERRY, JONATHAN J, MD  History of hyperlipidemia on fenofibrate  and Zetia  intolerant to statin therapy.  Her most recent lipid profile performed 11/04/2021 revealed total cholesterol 173, LDL of 99 and HDL 35.

## 2022-03-07 ENCOUNTER — Other Ambulatory Visit: Payer: Self-pay | Admitting: Nurse Practitioner

## 2022-03-13 ENCOUNTER — Encounter (HOSPITAL_BASED_OUTPATIENT_CLINIC_OR_DEPARTMENT_OTHER): Payer: 59 | Admitting: General Surgery

## 2022-03-30 ENCOUNTER — Other Ambulatory Visit: Payer: Self-pay | Admitting: Nurse Practitioner

## 2022-03-30 DIAGNOSIS — M1712 Unilateral primary osteoarthritis, left knee: Secondary | ICD-10-CM

## 2022-03-30 DIAGNOSIS — E782 Mixed hyperlipidemia: Secondary | ICD-10-CM

## 2022-04-06 ENCOUNTER — Other Ambulatory Visit: Payer: Self-pay | Admitting: Family Medicine

## 2022-04-06 DIAGNOSIS — K219 Gastro-esophageal reflux disease without esophagitis: Secondary | ICD-10-CM

## 2022-04-10 ENCOUNTER — Other Ambulatory Visit (HOSPITAL_BASED_OUTPATIENT_CLINIC_OR_DEPARTMENT_OTHER): Payer: Self-pay | Admitting: General Surgery

## 2022-04-10 ENCOUNTER — Encounter (HOSPITAL_BASED_OUTPATIENT_CLINIC_OR_DEPARTMENT_OTHER): Payer: 59 | Attending: General Surgery | Admitting: General Surgery

## 2022-04-10 DIAGNOSIS — I2510 Atherosclerotic heart disease of native coronary artery without angina pectoris: Secondary | ICD-10-CM | POA: Diagnosis not present

## 2022-04-10 DIAGNOSIS — M71021 Abscess of bursa, right elbow: Secondary | ICD-10-CM | POA: Insufficient documentation

## 2022-04-10 DIAGNOSIS — I10 Essential (primary) hypertension: Secondary | ICD-10-CM | POA: Insufficient documentation

## 2022-04-10 DIAGNOSIS — L97822 Non-pressure chronic ulcer of other part of left lower leg with fat layer exposed: Secondary | ICD-10-CM | POA: Diagnosis not present

## 2022-04-10 DIAGNOSIS — L97222 Non-pressure chronic ulcer of left calf with fat layer exposed: Secondary | ICD-10-CM | POA: Diagnosis not present

## 2022-04-10 DIAGNOSIS — I739 Peripheral vascular disease, unspecified: Secondary | ICD-10-CM | POA: Diagnosis not present

## 2022-04-10 DIAGNOSIS — L97922 Non-pressure chronic ulcer of unspecified part of left lower leg with fat layer exposed: Secondary | ICD-10-CM | POA: Diagnosis present

## 2022-04-10 DIAGNOSIS — Z89611 Acquired absence of right leg above knee: Secondary | ICD-10-CM | POA: Insufficient documentation

## 2022-04-10 NOTE — Progress Notes (Signed)
[name], [name] ([id number]) ?Visit Report for 04/10/2022 ?Allergy List Details ?Patient Name: Date of Service: ?MO [place], [name] 04/10/2022 1:30 PM ?Medical Record Number: [id number] ?Patient Account Number: [id number] ?Date of Birth/Sex: Treating RN: ?1951/07/04 (71 y.o. [name], [name] ?Primary Care [name]: [name] Other Clinician: ?Referring [name]: ?Treating [name]/Extender: [name] ?[name], [name] ?Weeks in Treatment: 0 ?Allergies ?Active Allergies ?penicillin ?Reaction: hives ?Severity: Moderate ?Plavix ?Severity: Moderate ?Demerol ?Severity: Moderate ?Percocet ?Severity: Mild ?Effexor ?Inderal LA ?Severity: Moderate ?atenolol ?Crestor ?Trilipix ?simvastatin ?Severity: Moderate ?Lovaza ?Severity: Moderate ?warfarin ?Nexium ?Lipitor ?prednisone ?pravastatin ?hydralazine ?gabapentin ?Allergy Notes ?Electronic Signature(s) ?Signed: 04/10/2022 6:10:38 PM By: Stacey Hurst RN, [name] ?Entered By: [name] on 04/10/2022 13:39:01 ?-------------------------------------------------------------------------------- ?Arrival Information Details ?Patient Name: Date of Service: ?MO [place], [name] 04/10/2022 1:30 PM ?Medical Record Number: [id number] ?Patient Account Number: [id number] ?Date of Birth/Sex: Treating RN: ?January 30, 1951 (72 y.o. [name], [name] ?Primary Care [name]: [name] Other Clinician: ?Referring [name]: ?Treating [name]/Extender: [name] ?[name], [name] ?Weeks in Treatment: 0 ?Visit Information ?Patient Arrived: [name] ?Arrival Time: 13:29 ?Accompanied By: alone ?Transfer Assistance: None ?Patient Identification Verified: Yes ?Secondary Verification Process Completed: Yes ?Patient Requires Transmission-Based Precautions: No ?Patient Has Alerts: No ?History Since Last Visit ?Added or deleted any medications: No ?Any new allergies or adverse reactions: No ?Had a fall or experienced change in activities of daily living that may affect risk of falls:  No ?Signs or symptoms of abuse/neglect since last visito No ?Hospitalized since last visit: No ?Implantable device outside of the clinic excluding cellular tissue based products placed in the center since last visit: No ?Has Dressing in Place as Prescribed: Yes ?Has Compression in Place as Prescribed: Yes ?Electronic Signature(s) ?Signed: 04/10/2022 6:10:38 PM By: Stacey Hurst RN, [name] ?Entered By: [name] on 04/10/2022 13:35:25 ?-------------------------------------------------------------------------------- ?Clinic Level of Care Assessment Details ?Patient Name: Date of Service: ?MO [place], [name] 04/10/2022 1:30 PM ?Medical Record Number: [id number] ?Patient Account Number: [id number] ?Date of Birth/Sex: Treating RN: ?May 21, 1951 (71 y.o. [name], [name] ?Primary Care [name]: [name] Other Clinician: ?Referring [name]: ?Treating [name]/Extender: [name] ?[name], [name] ?Weeks in Treatment: 0 ?Clinic Level of Care Assessment Items ?TOOL 1 Quantity Score ?X- 1 0 ?Use when EandM and Procedure is performed on INITIAL visit ?ASSESSMENTS - Nursing Assessment / Reassessment ?X- 1 20 ?General Physical Exam (combine w/ comprehensive assessment (listed just below) when performed on new pt. evals) ?X- 1 25 ?Comprehensive Assessment (HX, ROS, Risk Assessments, Wounds Hx, etc.) ?ASSESSMENTS - Wound and Skin Assessment / Reassessment ?'[]'$  - 0 ?Dermatologic / Skin Assessment (not related to wound area) ?ASSESSMENTS - Ostomy and/or Continence Assessment and Care ?'[]'$  - 0 ?Incontinence Assessment and Management ?'[]'$  - 0 ?Ostomy Care Assessment and Management (repouching, etc.) ?PROCESS - Coordination of Care ?X - Simple Patient / Family Education for ongoing care 1 15 ?'[]'$  - 0 ?Complex (extensive) Patient / Family Education for ongoing care ?X- 1 10 ?Staff obtains Consents, Records, T Results / Process Orders ?est ?'[]'$  - 0 ?Staff telephones HHA, Nursing Homes / Clarify orders / etc ?'[]'$  - 0 ?Routine  Transfer to another Facility (non-emergent condition) ?'[]'$  - 0 ?Routine Hospital Admission (non-emergent condition) ?X- 1 15 ?New Admissions / Biomedical engineer / Ordering NPWT Apligraf, etc. ?, ?'[]'$  - 0 ?Emergency Hospital Admission (emergent condition) ?PROCESS - Special Needs ?'[]'$  - 0 ?Pediatric / Minor Patient Management ?'[]'$  - 0 ?  Isolation Patient Management ?'[]'$  - 0 ?Hearing / Language / Visual special needs ?'[]'$  - 0 ?Assessment of Community assistance (transportation, D/C planning, etc.) ?'[]'$  - 0 ?Additional assistance / Altered mentation ?'[]'$  - 0 ?Support Surface(s) Assessment (bed, cushion, seat, etc.) ?INTERVENTIONS - Miscellaneous ?'[]'$  - 0 ?External ear exam ?'[]'$  - 0 ?Patient Transfer (multiple staff / Civil Service fast streamer / Similar devices) ?'[]'$  - 0 ?Simple Staple / Suture removal (25 or less) ?'[]'$  - 0 ?Complex Staple / Suture removal (26 or more) ?'[]'$  - 0 ?Hypo/Hyperglycemic Management (do not check if billed separately) ?X- 1 15 ?Ankle / Brachial Index (ABI) - do not check if billed separately ?Has the patient been seen at the hospital within the last three years: Yes ?Total Score: 100 ?Level Of Care: New/Established - Level 3 ?Electronic Signature(s) ?Signed: 04/10/2022 6:10:38 PM By: Stacey Hurst RN, [name] ?Entered By: [name] on 04/10/2022 17:38:00 ?-------------------------------------------------------------------------------- ?Encounter Discharge Information Details ?Patient Name: Date of Service: ?MO [place], [name] 04/10/2022 1:30 PM ?Medical Record Number: [id number] ?Patient Account Number: [id number] ?Date of Birth/Sex: Treating RN: ?09-27-1951 (71 y.o. [name], [name] ?Primary Care [name]: [name] Other Clinician: ?Referring [name]: ?Treating [name]/Extender: [name] ?[name], [name] ?Weeks in Treatment: 0 ?Encounter Discharge Information Items Post Procedure Vitals ?Discharge Condition: Stable ?Temperature (F): 98.3 ?Ambulatory Status: [name] ?Pulse (bpm): 79 ?Discharge  Destination: Home ?Respiratory Rate (breaths/min): 16 ?Transportation: Private Auto ?Blood Pressure (mmHg): 116/77 ?Accompanied By: alone ?Schedule Follow-up Appointment: Yes ?Clinical Summary of Care: Patient Declined ?Electronic Signature(s) ?Signed: 04/10/2022 6:10:38 PM By: Stacey Hurst RN, [name] ?Entered By: [name] on 04/10/2022 17:53:24 ?-------------------------------------------------------------------------------- ?Lower Extremity Assessment Details ?Patient Name: ?Date of Service: ?MO [place], [name] 04/10/2022 1:30 PM ?Medical Record Number: [id number] ?Patient Account Number: [id number] ?Date of Birth/Sex: ?Treating RN: ?[birth year] (71 y.o. [name], [name] ?Primary Care Earvin Blazier: [name], [name] NYEJE ?Other Clinician: ?Referring Rilynne Lonsway: ?Treating Sundance Moise/Extender: [name] ?[name], [name] ?Weeks in Treatment: 0 ?Edema Assessment ?Assessed: [Left: No] [Right: No] ?Edema: [Left: Ye] [Right: s] ?Calf ?Left: Right: ?Point of Measurement: 29 cm From Medial Instep 32.2 cm ?Ankle ?Left: Right: ?Point of Measurement: 18 cm From Medial Instep 18.6 cm ?Vascular Assessment ?Pulses: ?Dorsalis Pedis ?Palpable: [Left:No] ?Blood Pressure: ?Brachial: [Left:116] ?Ankle: ?[Left:Dorsalis Pedis: 72 0.62] ?Electronic Signature(s) ?Signed: 04/10/2022 6:10:38 PM By: Stacey Hurst RN, [name] ?Entered By: [name] on 04/10/2022 13:57:26 ?-------------------------------------------------------------------------------- ?Multi Wound Chart Details ?Patient Name: ?Date of Service: ?MO [place], [name] 04/10/2022 1:30 PM ?Medical Record Number: [id number] ?Patient Account Number: [id number] ?Date of Birth/Sex: ?Treating RN: ?17-Feb-1951 (71 y.o. [name], [name] ?Primary Care Takeria Marquina: [name], [name] NYEJE ?Other Clinician: ?Referring Jordynn Perrier: ?Treating Ameisha Mcclellan/Extender: [name] ?[name], [name] ?Weeks in Treatment: 0 ?Vital Signs ?Height(in): 64 ?Pulse(bpm): 79 ?Weight(lbs): 137 ?Blood Pressure(mmHg):  116/77 ?Body Mass Index(BMI): 23.5 ?Temperature(??F): 98.3 ?Respiratory Rate(breaths/min): 16 ?Photos: [13:Left, Anterior Lower Leg] [14:Left, Posterior Lower Leg] [15:Right Elbow] ?Wound Location: [13:Blister] [14:Blister] [id number]

## 2022-04-10 NOTE — Progress Notes (Signed)
[name], HIGHLEY ([id number]) ?Visit Report for 04/10/2022 ?Abuse Risk Screen Details ?Patient Name: Date of Service: ?MO [place], [name] 04/10/2022 1:30 PM ?Medical Record Number: [id number] ?Patient Account Number: [id number] ?Date of Birth/Sex: Treating RN: ?1951/11/02 (71 y.o. [name], [name] ?Primary Care Wendelin Reader: [name] Other Clinician: ?Referring Shakerra Red: ?Treating Nikole Swartzentruber/Extender: [name] ?[name], [name] ?Weeks in Treatment: 0 ?Abuse Risk Screen Items ?Answer ?ABUSE RISK SCREEN: ?Has anyone close to you tried to hurt or harm you recentlyo No ?Do you feel uncomfortable with anyone in your familyo No ?Has anyone forced you do things that you didnt want to doo No ?Electronic Signature(s) ?Signed: 04/10/2022 6:10:38 PM By: [name], [name] ?Entered By: Levan Hurst on 04/10/2022 13:51:08 ?-------------------------------------------------------------------------------- ?Activities of Daily Living Details ?Patient Name: Date of Service: ?MO [place], [name] 04/10/2022 1:30 PM ?Medical Record Number: [id number] ?Patient Account Number: [id number] ?Date of Birth/Sex: Treating RN: ?[birth year] (71 y.o. [name], [name] ?Primary Care Trennon Torbeck: [name] Other Clinician: ?Referring Kristina Bertone: ?Treating Tad Fancher/Extender: [name] ?[name], [name] ?Weeks in Treatment: 0 ?Activities of Daily Living Items ?Answer ?Activities of Daily Living (Please select one for each item) ?Drive Automobile Not Able ?T Medications ?ake Completely Able ?Use T elephone Completely Able ?Care for Appearance Completely Able ?Use T oilet Completely Able ?Bath / Shower Completely Able ?Dress Self Completely Able ?Feed Self Completely Able ?Walk Completely Able ?Get In / Out Bed Completely Able ?Housework Completely Able ?Prepare Meals Completely Able ?Handle Money Completely Able ?Shop for Self Need Assistance ?Electronic Signature(s) ?Signed: 04/10/2022 6:10:38 PM By: [name], [name] ?Entered By:  Levan Hurst on 04/10/2022 13:51:37 ?-------------------------------------------------------------------------------- ?Education Screening Details ?Patient Name: ?Date of Service: ?MO [place], [name] 04/10/2022 1:30 PM ?Medical Record Number: [id number] ?Patient Account Number: [id number] ?Date of Birth/Sex: ?Treating RN: ?1951-11-26 (71 y.o. [name], [name] ?Primary Care Shermaine Brigham: [name], [name] NYEJE ?Other Clinician: ?Referring Krisann Mckenna: ?Treating Rosselyn Martha/Extender: [name] ?[name], [name] ?Weeks in Treatment: 0 ?Primary Learner Assessed: Patient ?Learning Preferences/Education Level/Primary Language ?Learning Preference: Explanation ?Highest Education Level: High School ?Preferred Language: English ?Cognitive Barrier ?Language Barrier: No ?Translator Needed: No ?Memory Deficit: No ?Emotional Barrier: No ?Cultural/Religious Beliefs Affecting Medical Care: No ?Physical Barrier ?Impaired Vision: No ?Impaired Hearing: No ?Decreased Hand dexterity: No ?Knowledge/Comprehension ?Knowledge Level: High ?Comprehension Level: High ?Ability to understand written instructions: High ?Ability to understand verbal instructions: High ?Motivation ?Anxiety Level: Calm ?Cooperation: Cooperative ?Education Importance: Acknowledges Need ?Interest in Health Problems: Asks Questions ?Perception: Coherent ?Willingness to Engage in Self-Management High ?Activities: ?Readiness to Engage in Self-Management High ?Activities: ?Electronic Signature(s) ?Signed: 04/10/2022 6:10:38 PM By: [name], [name] ?Entered By: Levan Hurst on 04/10/2022 13:52:11 ?-------------------------------------------------------------------------------- ?Fall Risk Assessment Details ?Patient Name: ?Date of Service: ?MO [place], [name] 04/10/2022 1:30 PM ?Medical Record Number: [id number] ?Patient Account Number: [id number] ?Date of Birth/Sex: ?Treating RN: ?1951-09-23 (71 y.o. [name], [name] ?Primary Care Paul Torpey: [name], [name] NYEJE ?Other  Clinician: ?Referring Lysa Livengood: ?Treating Annastacia Duba/Extender: [name] ?[name], [name] ?Weeks in Treatment: 0 ?Fall Risk Assessment Items ?Have you had 2 or more falls in the last 12 monthso 0 No ?Have you had any fall that resulted in injury in the last 12 monthso 0 No ?FALLS RISK SCREEN ?History of falling - immediate or within 3 months 0 No ?Secondary diagnosis (Do you have 2 or more medical diagnoseso) 15 Yes ?Ambulatory aid ?None/bed rest/wheelchair/nurse  0 No ?Crutches/cane/walker 15 Yes ?Furniture 0 No ?Intravenous therapy Access/Saline/Heparin Lock 0 No ?Gait/Transferring ?Normal/ bed rest/ wheelchair 0 Yes ?Weak (short steps with or without shuffle, stooped but able to lift head while walking, may seek 0 No ?support from furniture) ?Impaired (short steps with shuffle, may have difficulty arising from chair, head down, impaired 0 No ?balance) ?Mental Status ?Oriented to own ability 0 Yes ?Electronic Signature(s) ?Signed: 04/10/2022 6:10:38 PM By: [name], [name] ?Entered By: Levan Hurst on 04/10/2022 13:52:26 ?-------------------------------------------------------------------------------- ?Foot Assessment Details ?Patient Name: ?Date of Service: ?MO [place], [name] 04/10/2022 1:30 PM ?Medical Record Number: [id number] ?Patient Account Number: [id number] ?Date of Birth/Sex: ?Treating RN: ?08/20/1951 (71 y.o. [name], [name] ?Primary Care Taija Mathias: [name], [name] NYEJE ?Other Clinician: ?Referring Jann Milkovich: ?Treating Jarone Ostergaard/Extender: [name] ?[name], [name] ?Weeks in Treatment: 0 ?Foot Assessment Items ?Site Locations ?+ = Sensation present, - = Sensation absent, C = Callus, U = Ulcer ?R = Redness, W = Warmth, M = Maceration, PU = Pre-ulcerative lesion ?F = Fissure, S = Swelling, D = Dryness ?Assessment ?Right: Left: ?Other Deformity: No No ?Prior Foot Ulcer: No No ?Prior Amputation: No No ?Charcot Joint: No No ?Ambulatory Status: Ambulatory Without Help ?Gait: Steady ?Electronic  Signature(s) ?Signed: 04/10/2022 6:10:38 PM By: [name], [name] ?Entered By: Levan Hurst on 04/10/2022 13:58:13 ?-------------------------------------------------------------------------------- ?Nutrition Risk Screening Details ?Patient Name: ?Date of Service: ?MO [place], [name] 04/10/2022 1:30 PM ?Medical Record Number: [id number] ?Patient Account Number: [id number] ?Date of Birth/Sex: ?Treating RN: ?Nov 13, 1951 (71 y.o. [name], [name] ?Primary Care Jameek Bruntz: [name], [name] NYEJE ?Other Clinician: ?Referring Jamani Eley: ?Treating Milledge Gerding/Extender: [name] ?[name], [name] ?Weeks in Treatment: 0 ?Height (in): 64 ?Weight (lbs): 137 ?Body Mass Index (BMI): 23.5 ?Nutrition Risk Screening Items ?Score Screening ?NUTRITION RISK SCREEN: ?I have an illness or condition that made me change the kind and/or amount of food I eat 0 No ?I eat fewer than two meals per day 0 No ?I eat few fruits and vegetables, or milk products 0 No ?I have three or more drinks of beer, liquor or wine almost every day 0 No ?I have tooth or mouth problems that make it hard for me to eat 0 No ?I don't always have enough money to buy the food I need 0 No ?I eat alone most of the time 0 No ?I take three or more different prescribed or over-the-counter drugs a day 1 Yes ?Without wanting to, I have lost or gained 10 pounds in the last six months 0 No ?I am not always physically able to shop, cook and/or feed myself 2 Yes ?Nutrition Protocols ?Good Risk Protocol ?Moderate Risk Protocol 0 Provide education on nutrition ?High Risk Proctocol ?Risk Level: Moderate Risk ?Score: 3 ?Electronic Signature(s) ?Signed: 04/10/2022 6:10:38 PM By: [name], [name] ?Entered By: Levan Hurst on 04/10/2022 13:52:59 ?
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
## 2022-04-15 LAB — AEROBIC/ANAEROBIC CULTURE W GRAM STAIN (SURGICAL/DEEP WOUND): Culture: NO GROWTH

## 2022-04-20 ENCOUNTER — Encounter (HOSPITAL_BASED_OUTPATIENT_CLINIC_OR_DEPARTMENT_OTHER): Payer: 59 | Attending: General Surgery | Admitting: General Surgery

## 2022-04-20 DIAGNOSIS — Z87891 Personal history of nicotine dependence: Secondary | ICD-10-CM | POA: Diagnosis not present

## 2022-04-20 DIAGNOSIS — M71021 Abscess of bursa, right elbow: Secondary | ICD-10-CM | POA: Insufficient documentation

## 2022-04-20 DIAGNOSIS — I10 Essential (primary) hypertension: Secondary | ICD-10-CM | POA: Diagnosis not present

## 2022-04-20 DIAGNOSIS — I739 Peripheral vascular disease, unspecified: Secondary | ICD-10-CM | POA: Insufficient documentation

## 2022-04-20 DIAGNOSIS — L97222 Non-pressure chronic ulcer of left calf with fat layer exposed: Secondary | ICD-10-CM | POA: Diagnosis not present

## 2022-04-20 DIAGNOSIS — L97822 Non-pressure chronic ulcer of other part of left lower leg with fat layer exposed: Secondary | ICD-10-CM | POA: Insufficient documentation

## 2022-04-20 DIAGNOSIS — Z89611 Acquired absence of right leg above knee: Secondary | ICD-10-CM | POA: Insufficient documentation

## 2022-04-20 DIAGNOSIS — I2510 Atherosclerotic heart disease of native coronary artery without angina pectoris: Secondary | ICD-10-CM | POA: Insufficient documentation

## 2022-04-20 DIAGNOSIS — Z7901 Long term (current) use of anticoagulants: Secondary | ICD-10-CM | POA: Diagnosis not present
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
## 2022-04-27 ENCOUNTER — Encounter (HOSPITAL_BASED_OUTPATIENT_CLINIC_OR_DEPARTMENT_OTHER): Payer: 59 | Admitting: General Surgery

## 2022-04-27 DIAGNOSIS — I739 Peripheral vascular disease, unspecified: Secondary | ICD-10-CM | POA: Diagnosis not present
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
## 2022-04-27 NOTE — Progress Notes (Signed)
[name], [name] ([id number]) ?Visit Report for 04/27/2022 ?Arrival Information Details ?Patient Name: Date of Service: ?[place], [name] 04/27/2022 10:45 A M ?Medical Record Number: [id number] ?Patient Account Number: [id number] ?Date of Birth/Sex: Treating RN: ?1951/04/15 (71 y.o. [name], [name] ?Primary Care [name]: [name] Other Clinician: ?Referring [name]: ?Treating [name]/Extender: [name] ?[name] ?Weeks in Treatment: 2 ?Visit Information History Since Last Visit ?Added or deleted any medications: No ?Patient Arrived: [name] ?Any new allergies or adverse reactions: No ?Arrival Time: 10:12 ?Had a fall or experienced change in No ?Accompanied By: self ?activities of daily living that may affect ?Transfer Assistance: None ?risk of falls: ?Patient Identification Verified: Yes ?Signs or symptoms of abuse/neglect since last visito No ?Secondary Verification Process Completed: Yes ?Hospitalized since last visit: No ?Patient Requires Transmission-Based Precautions: No ?Implantable device outside of the clinic excluding No ?Patient Has Alerts: No ?cellular tissue based products placed in the center ?since last visit: ?Has Dressing in Place as Prescribed: Yes ?Pain Present Now: No ?Electronic Signature(s) ?Signed: 04/27/2022 1:54:41 PM By: [name] ?Entered By: [name] on 04/27/2022 10:13:18 ?-------------------------------------------------------------------------------- ?Encounter Discharge Information Details ?Patient Name: Date of Service: ?MO [place], [name] 04/27/2022 10:45 A M ?Medical Record Number: [id number] ?Patient Account Number: [id number] ?Date of Birth/Sex: Treating RN: ?03/13/1951 (70 y.o. [name] ?Primary Care Kaelynne Christley: [name] Other Clinician: ?Referring Lindey Renzulli: ?Treating Jilleen Essner/Extender: [name] ?[name] ?Weeks in Treatment: 2 ?Encounter Discharge Information Items Post Procedure Vitals ?Discharge Condition:  Stable ?Temperature (F): 98.5 ?Ambulatory Status: [name] ?Pulse (bpm): 73 ?Discharge Destination: Home ?Respiratory Rate (breaths/min): 16 ?Transportation: Private Auto ?Blood Pressure (mmHg): 120/64 ?Accompanied By: self ?Schedule Follow-up Appointment: Yes ?Clinical Summary of Care: Patient Declined ?Electronic Signature(s) ?Signed: 04/27/2022 4:43:42 PM By: [name] ?Entered By: [name] on 04/27/2022 11:13:58 ?-------------------------------------------------------------------------------- ?Lower Extremity Assessment Details ?Patient Name: ?Date of Service: ?[place], [name] 04/27/2022 10:45 A M ?Medical Record Number: [id number] ?Patient Account Number: [id number] ?Date of Birth/Sex: ?Treating RN: ?24-Mar-1951 (71 y.o. F) [name], [name] ?Primary Care Gisella Alwine: [name] ?Other Clinician: ?Referring Landynn Dupler: ?Treating Houda Brau/Extender: [name] ?[name] ?Weeks in Treatment: 2 ?Edema Assessment ?Assessed: [Left: No] [Right: No] ?Edema: [Left: Ye] [Right: s] ?Calf ?Left: Right: ?Point of Measurement: 29 cm From Medial Instep 30 cm ?Ankle ?Left: Right: ?Point of Measurement: 18 cm From Medial Instep 18.8 cm ?Vascular Assessment ?Pulses: ?Dorsalis Pedis ?Palpable: [Left:No] ?Electronic Signature(s) ?Signed: 04/27/2022 5:43:51 PM By: Baruch Gouty RN, [name] ?Entered By: Baruch Gouty on 04/27/2022 10:35:45 ?-------------------------------------------------------------------------------- ?Multi Wound Chart Details ?Patient Name: ?Date of Service: ?MO [place], [name] 04/27/2022 10:45 A M ?Medical Record Number: [id number] ?Patient Account Number: [id number] ?Date of Birth/Sex: ?Treating RN: ?December 30, 1950 (71 y.o. [name], [name] ?Primary Care Jasmond River: [name] ?Other Clinician: ?Referring Jariana Shumard: ?Treating Marki Frede/Extender: [name] ?[name] ?Weeks in Treatment: 2 ?Vital Signs ?Height(in): 64 ?Pulse(bpm): 73 ?Weight(lbs): 137 ?Blood Pressure(mmHg): 120/64 ?Body  Mass Index(BMI): 23.5 ?Temperature(??F): 98.5 ?Respiratory Rate(breaths/min): 16 ?Photos: [13:Left, Anterior Lower Leg] [14:Left, Posterior Lower Leg] [15:Right Elbow] ?Wound Location: [13:Blister] [14:Blister] [15:Bump] ?Wounding Event: [13:Venous Leg Ulcer] [14:Venous Leg Ulcer] [15:Infection - not elsewhere classified] ?Primary Etiology: [13:Glaucoma, Arrhythmia, Hypertension, Glaucoma, Arrhythmia, Hypertension, Glaucoma, Arrhythmia, Hypertension,] ?Comorbid History: [13:Peripheral Arterial Disease, Peripheral Peripheral Arterial Disease, Peripheral Peripheral Arterial Disease, Peripheral Venous Disease, Gout, Osteoarthritis Venous Disease, Gout, Osteoarthritis Venous Disease, Gout, Osteoarthritis  ?01/21/2022] [14:01/21/2022] [15:04/08/2022] ?Date Acquired: [13:2] [14:2] [15:2] ?Weeks of Treatment: [13:Open] [14:Open] [15:Healed - Epithelialized] ?Wound Status: [13:No] [14:No] [15:No] ?Wound Recurrence: [13:1.1x0.5x0.1] [14:2.6x2.6x0.1] [  15:0x0x0] ?Measurements L x W x D (cm) [13:0.432] [10:1.751] [15:0] ?A (cm?) : ?rea [13:0.043] [14:0.531] [15:0] ?Volume (cm?) : [13:65.60%] [14:24.60%] [15:100.00%] ?% Reduction in A [13:rea: 65.90%] [14:24.60%] [15:100.00%] ?% Reduction in Volume: [13:Full Thickness Without Exposed] [14:Full Thickness Without Exposed] [15:Full Thickness Without Exposed] ?Classification: [13:Support Structures Small] [14:Support Structures Medium] [15:Support Structures None Present] ?Exudate A mount: [13:Serosanguineous] [14:Serosanguineous] [15:N/A] ?Exudate Type: [13:red, brown] [14:red, brown] [15:N/A] ?Exudate Color: [13:Flat and Intact] [14:Flat and Intact] [15:N/A] ?Wound Margin: [13:Medium (34-66%)] [14:Medium (34-66%)] [15:None Present (0%)] ?Granulation A mount: [13:Pink, Pale] [14:Red, Pink] [15:N/A] ?Granulation Quality: [13:Medium (34-66%)] [14:Medium (34-66%)] [15:None Present (0%)] ?Necrotic A mount: ?[13:Fat Layer (Subcutaneous Tissue): Yes Fat Layer (Subcutaneous Tissue): Yes  Fascia: No] ?Exposed Structures: ?[13:Fascia: No Tendon: No Muscle: No Joint: No Bone: No Small (1-33%)] [14:Fascia: No Tendon: No Muscle: No Joint: No Bone: No Small (1-33%)] [15:Fat Layer (Subcutaneous Tissue): No Tendon: No Muscle: No Joint: No Bone: No Large (67-100%)] ?Epithelialization: [13:Debridement - Excisional] [14:Debridement - Excisional] [15:N/A] ?Debridement: ?Pre-procedure Verification/Time Out 10:56 [14:10:56] [15:N/A] ?Taken: [13:Other] [14:Other] [15:N/A] ?Pain Control: [13:Subcutaneous, Slough] [14:Subcutaneous, Slough] [15:N/A] ?Tissue Debrided: [13:Skin/Subcutaneous Tissue] [14:Skin/Subcutaneous Tissue] [15:N/A] ?Level: [13:0.55] [14:6.76] [15:N/A] ?Debridement A (sq cm): [13:rea Curette] [14:Curette] [15:N/A] ?Instrument: [13:Minimum] [14:Minimum] [15:N/A] ?Bleeding: [13:Pressure] [14:Pressure] [15:N/A] ?Hemostasis A chieved: [13:0] [14:0] [15:N/A] ?Procedural Pain: [13:0] [14:0] [15:N/A] ?Post Procedural Pain: [13:Procedure was tolerated well] [14:Procedure was tolerated well] [15:N/A] ?Debridement Treatment Response: [13:1.1x0.5x0.1] [14:2.6x2.6x0.1] [15:N/A] ?Post Debridement Measurements L x ?W x D (cm) [13:0.043] [14:0.531] [15:N/A] ?Post Debridement Volume: (cm?) [13:Debridement] [14:Debridement] [15:N/A] ?Treatment Notes ?Electronic Signature(s) ?Signed: 04/27/2022 11:02:08 AM By: Stacey Maudlin MD FACS ?Signed: 04/27/2022 5:39:57 PM By: [name], [name] ?Entered By: [name] on 04/27/2022 11:02:08 ?-------------------------------------------------------------------------------- ?Multi-Disciplinary Care Plan Details ?Patient Name: ?Date of Service: ?MO [place], [name] 04/27/2022 10:45 A M ?Medical Record Number: [id number] ?Patient Account Number: [id number] ?Date of Birth/Sex: ?Treating RN: ?Apr 03, 1951 (71 y.o. F) [name], [name] ?Primary Care Heith Haigler: [name] ?Other Clinician: ?Referring Fredis Malkiewicz: ?Treating Saidah Kempton/Extender: [name] ?[name] ?Weeks in  Treatment: 2 ?Multidisciplinary Care Plan reviewed with physician ?Active Inactive ?Abuse / Safety / Falls / Self Care Management ?Nursing Diagnoses: ?Potential for falls ?Potential for injury related to falls ?Goals: ?Patient

## 2022-04-30 ENCOUNTER — Other Ambulatory Visit: Payer: Self-pay | Admitting: Nurse Practitioner

## 2022-04-30 DIAGNOSIS — M1712 Unilateral primary osteoarthritis, left knee: Secondary | ICD-10-CM

## 2022-04-30 DIAGNOSIS — E782 Mixed hyperlipidemia: Secondary | ICD-10-CM

## 2022-04-30 DIAGNOSIS — I482 Chronic atrial fibrillation, unspecified: Secondary | ICD-10-CM

## 2022-05-04 ENCOUNTER — Encounter (HOSPITAL_BASED_OUTPATIENT_CLINIC_OR_DEPARTMENT_OTHER): Payer: 59 | Admitting: General Surgery

## 2022-05-05 ENCOUNTER — Encounter (HOSPITAL_BASED_OUTPATIENT_CLINIC_OR_DEPARTMENT_OTHER): Payer: 59 | Admitting: General Surgery

## 2022-05-05 DIAGNOSIS — I739 Peripheral vascular disease, unspecified: Secondary | ICD-10-CM | POA: Diagnosis not present

## 2022-05-05 NOTE — Progress Notes (Signed)
[name], [name] ([id number]) ?Visit Report for 05/05/2022 ?Arrival Information Details ?Patient Name: Date of Service: ?MO [place], [name] 05/05/2022 1:15 PM ?Medical Record Number: [id number] ?Patient Account Number: [id number] ?Date of Birth/Sex: Treating RN: ?1951/08/04 (71 y.o. [name] ?Primary Care Jesly Hartmann: [name] Other Clinician: ?Referring Alexzandra Bilton: ?Treating Starla Deller/Extender: [name] ?[name] ?Weeks in Treatment: 3 ?Visit Information History Since Last Visit ?Added or deleted any medications: No ?Patient Arrived: [name] ?Any new allergies or adverse reactions: No ?Arrival Time: 13:56 ?Had a fall or experienced change in No ?Accompanied By: self ?activities of daily living that may affect ?Transfer Assistance: None ?risk of falls: ?Patient Identification Verified: Yes ?Signs or symptoms of abuse/neglect since last visito No ?Secondary Verification Process Completed: Yes ?Hospitalized since last visit: No ?Patient Requires Transmission-Based Precautions: No ?Implantable device outside of the clinic excluding No ?Patient Has Alerts: No ?cellular tissue based products placed in the center ?since last visit: ?Has Dressing in Place as Prescribed: Yes ?Has Compression in Place as Prescribed: Yes ?Pain Present Now: No ?Electronic Signature(s) ?Signed: 05/05/2022 5:12:22 PM By: Adline Peals ?Entered By: Adline Peals on 05/05/2022 13:59:32 ?-------------------------------------------------------------------------------- ?Encounter Discharge Information Details ?Patient Name: Date of Service: ?MO [name], [name] 05/05/2022 1:15 PM ?Medical Record Number: [id number] ?Patient Account Number: [id number] ?Date of Birth/Sex: Treating RN: ?[birth year] (71 y.o. F) [name], Stacey Claude ?Primary Care Wilford Merryfield: [name] Other Clinician: ?Referring Karyssa Amaral: ?Treating Ethin Drummond/Extender: [name] ?[name] ?Weeks in Treatment: 3 ?Encounter Discharge Information Items Post  Procedure Vitals ?Discharge Condition: Stable ?Temperature (F): 98.5 ?Ambulatory Status: [name] ?Pulse (bpm): 69 ?Discharge Destination: Home ?Respiratory Rate (breaths/min): 16 ?Transportation: Private Auto ?Blood Pressure (mmHg): 95/61 ?Accompanied By: self ?Schedule Follow-up Appointment: Yes ?Clinical Summary of Care: Patient Declined ?Electronic Signature(s) ?Signed: 05/05/2022 5:16:10 PM By: Dellie Catholic RN ?Entered By: Dellie Catholic on 05/05/2022 17:08:27 ?-------------------------------------------------------------------------------- ?Lower Extremity Assessment Details ?Patient Name: ?Date of Service: ?MO [name], [name] 05/05/2022 1:15 PM ?Medical Record Number: [id number] ?Patient Account Number: [id number] ?Date of Birth/Sex: ?Treating RN: ?Jul 04, 1951 (71 y.o. F) [name], Stacey Claude ?Primary Care Khaila Velarde: [name] ?Other Clinician: ?Referring Teagan Heidrick: ?Treating Zebulan Hinshaw/Extender: [name] ?[name] ?Weeks in Treatment: 3 ?Edema Assessment ?Assessed: [Left: No] [Right: No] ?Edema: [Left: Ye] [Right: s] ?Calf ?Left: Right: ?Point of Measurement: 29 cm From Medial Instep 30 cm ?Ankle ?Left: Right: ?Point of Measurement: 18 cm From Medial Instep 19 cm ?Knee To Floor ?Left: Right: ?From Medial Instep 45 cm ?Electronic Signature(s) ?Signed: 05/05/2022 5:16:10 PM By: Dellie Catholic RN ?Entered By: Dellie Catholic on 05/05/2022 14:10:30 ?-------------------------------------------------------------------------------- ?Multi Wound Chart Details ?Patient Name: ?Date of Service: ?MO [name], [name] 05/05/2022 1:15 PM ?Medical Record Number: [id number] ?Patient Account Number: [id number] ?Date of Birth/Sex: ?Treating RN: ?1951-08-14 (71 y.o. F) [name], Stacey Claude ?Primary Care Meral Geissinger: [name] ?Other Clinician: ?Referring Bayani Renteria: ?Treating Huma Imhoff/Extender: [name] ?[name] ?Weeks in Treatment: 3 ?Vital Signs ?Height(in): 64 ?Pulse(bpm): 69 ?Weight(lbs): 137 ?Blood Pressure(mmHg):  95/61 ?Body Mass Index(BMI): 23.5 ?Temperature(??F): 98.5 ?Respiratory Rate(breaths/min): 16 ?Photos: [13:No Photos Left, Anterior Lower Leg] [14:No Photos Left, Posterior Lower Leg] [16:No Photos Left, Proximal, Posterior Lower Leg] ?Wound Location: [13:Blister] [14:Blister] [16:Shear/Friction] ?Wounding Event: [13:Venous Leg Ulcer] [14:Venous Leg Ulcer] [16:Inflammatory] ?Primary Etiology: [13:Glaucoma, Arrhythmia, Hypertension,] [14:Glaucoma, Arrhythmia, Hypertension,] [16:Glaucoma, Arrhythmia, Hypertension,] ?Comorbid History: [13:Peripheral Arterial Disease, Peripheral Venous Disease, Gout, Osteoarthritis 01/21/2022] [14:Peripheral Arterial Disease, Peripheral Venous Disease, Gout, Osteoarthritis 01/21/2022] [16:Peripheral Arterial Disease, Peripheral Venous  ?Disease, Gout, Osteoarthritis 05/01/2022] ?Date Acquired: [13:3] [14:3] [16:0] ?Weeks of Treatment: [13:Open] [14:Open] [16:Open] ?  Wound Status: [13:No] [14:No] [16:No] ?Wound Recurrence: [13:0.1x0.1x0.1] [14:2.6x2.4x0.1] [16:2x1.4x0.1] ?Measurements L x W x D (cm) [13:0.008] [14:4.901] [16:2.199] ?A (cm?) : ?rea [13:0.001] [14:0.49] [16:0.22] ?Volume (cm?) : [13:99.40%] [14:30.40%] [16:N/A] ?% Reduction in Area: [13:99.20%] [14:30.40%] [16:N/A] ?% Reduction in Volume: [13:Full Thickness Without Exposed] [14:Full Thickness Without Exposed] [16:Full Thickness Without Exposed] ?Classification: [13:Support Structures Small] [14:Support Structures Medium] [16:Support Structures Medium] ?Exudate A mount: [13:Serosanguineous] [14:Serosanguineous] [16:Serosanguineous] ?Exudate Type: [13:red, brown] [14:red, brown] [16:red, brown] ?Exudate Color: [13:Flat and Intact] [14:Flat and Intact] [16:Distinct, outline attached] ?Wound Margin: [13:Medium (34-66%)] [14:Medium (34-66%)] [16:Medium (34-66%)] ?Granulation A mount: [13:Pink, Pale] [14:Pink] [16:Red] ?Granulation Quality: [13:Medium (34-66%)] [14:Medium (34-66%)] [16:Medium (34-66%)] ?Necrotic A mount: [13:Adherent  Slough] [14:Adherent Slough] [16:Eschar, Adherent Slough] ?Necrotic Tissue: ?[13:Fat Layer (Subcutaneous Tissue): Yes Fascia: No] [16:Fat Layer (Subcutaneous Tissue): Yes] ?Exposed Structures: ?[13:Fascia: No Tendon: No Muscle: No Joint: No Bone: No Small (1-33%)] [14:Fat Layer (Subcutaneous Tissue): No Tendon: No Muscle: No Joint: No Bone: No Small (1-33%)] [16:Fascia: No Tendon: No Muscle: No Joint: No Bone: No Small (1-33%)] ?Epithelialization: [13:N/A] [14:Debridement - Excisional] [16:Debridement - Excisional] ?Debridement: ?Pre-procedure Verification/Time Out N/A [14:14:39] [16:14:39] ?Taken: [13:N/A] [14:Other] [16:Other] ?Pain Control: [13:N/A] [14:Subcutaneous, Slough] [16:Subcutaneous, Slough] ?Tissue Debrided: [13:N/A] [14:Skin/Subcutaneous Tissue] [16:Skin/Subcutaneous Tissue] ?Level: [13:N/A] [14:6.24] [16:2.8] ?Debridement A (sq cm): [13:rea N/A] [14:Curette] [16:Curette] ?Instrument: [13:N/A] [14:Minimum] [16:Minimum] ?Bleeding: [13:N/A] [14:Pressure] [16:Pressure] ?Hemostasis A chieved: [13:N/A] [14:0] [16:0] ?Procedural Pain: [13:N/A] [14:0] [16:0] ?Post Procedural Pain: [13:N/A] [14:Procedure was tolerated well] [16:Procedure was tolerated well] ?Debridement Treatment Response: [13:N/A] [14:2.6x2.4x0.1] [16:2x1.4x0.1] ?Post Debridement Measurements L x ?W x D (cm) [13:N/A] [14:0.49] [16:0.22] ?Post Debridement Volume: (cm?) [13:N/A] [14:N/A] [16:Debridement] ?Wound Number: [id number] 18 N/A ?Photos: No Photos No Photos N/A ?Left, Proximal, Anterior Lower Leg Left, Medial Lower Leg N/A ?Wound Location: ?Skin T ear/Laceration Shear/Friction N/A ?Wounding Event: ?Inflammatory Inflammatory N/A ?Primary Etiology: ?Glaucoma, Arrhythmia, Hypertension, Glaucoma, Arrhythmia, Hypertension, N/A ?Comorbid History: ?Peripheral Arterial Disease, Peripheral Peripheral Arterial Disease, Peripheral ?Venous Disease, Gout, Osteoarthritis Venous Disease, Gout, Osteoarthritis ?05/01/2022 05/01/2022 N/A ?Date Acquired: ?0 0  N/A ?Weeks of Treatment: ?Open Open N/A ?Wound Status: ?No No N/A ?Wound Recurrence: ?0.3x0.5x0.1 1.4x0.8x0.1 N/A ?Measurements L x W x D (cm) ?0.118 0.88 N/A ?A (cm?) : ?rea ?0.012 0.088 N/A ?Volume (cm?) : ?N
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
## 2022-05-08 ENCOUNTER — Other Ambulatory Visit: Payer: Self-pay | Admitting: Nurse Practitioner

## 2022-05-11 ENCOUNTER — Encounter (HOSPITAL_BASED_OUTPATIENT_CLINIC_OR_DEPARTMENT_OTHER): Payer: 59 | Admitting: General Surgery

## 2022-05-11 DIAGNOSIS — I739 Peripheral vascular disease, unspecified: Secondary | ICD-10-CM | POA: Diagnosis not present

## 2022-05-11 NOTE — Progress Notes (Signed)
LUCKY, ALVERSON ([id number]) Visit Report for 05/11/2022 Arrival Information Details Patient Name: Date of Service: [place], [name] 05/11/2022 9:45 A M Medical Record Number: [id number] Patient Account Number: [id number] Date of Birth/Sex: Treating RN: [birth year] (71 y.o. Martyn Malay, [name] Primary Care Kitiara Hintze: [name] Other Clinician: Referring Daylyn Azbill: Treating Delmer Kowalski/Extender: Earnestine Leys in Treatment: 4 Visit Information History Since Last Visit Added or deleted any medications: No Patient Arrived: Walker Any new allergies or adverse reactions: No Arrival Time: 09:43 Had a fall or experienced change in No Accompanied By: self activities of daily living that may affect Transfer Assistance: None risk of falls: Patient Identification Verified: Yes Signs or symptoms of abuse/neglect since last visito No Secondary Verification Process Completed: Yes Hospitalized since last visit: No Patient Requires Transmission-Based Precautions: No Implantable device outside of the clinic excluding No Patient Has Alerts: No cellular tissue based products placed in the center since last visit: Has Dressing in Place as Prescribed: Yes Has Compression in Place as Prescribed: Yes Pain Present Now: No Electronic Signature(s) Signed: 05/11/2022 11:02:51 AM By: Baruch Gouty RN, [name] Entered By: Baruch Gouty on 05/11/2022 09:46:11 -------------------------------------------------------------------------------- Clinic Level of Care Assessment Details Patient Name: Date of Service: [place], [name] 05/11/2022 9:45 A M Medical Record Number: [id number] Patient Account Number: [id number] Date of Birth/Sex: Treating RN: 1951/09/14 (71 y.o. Martyn Malay, [name] Primary Care Key Cen: [name] Other Clinician: Referring Okie Jansson: Treating Freddye Cardamone/Extender: Earnestine Leys in Treatment: 4 Clinic Level of Care Assessment Items TOOL 4  Quantity Score '[]'$  - 0 Use when only an EandM is performed on FOLLOW-UP visit ASSESSMENTS - Nursing Assessment / Reassessment X- 1 10 Reassessment of Co-morbidities (includes updates in patient status) X- 1 5 Reassessment of Adherence to Treatment Plan ASSESSMENTS - Wound and Skin A ssessment / Reassessment '[]'$  - 0 Simple Wound Assessment / Reassessment - one wound X- 5 5 Complex Wound Assessment / Reassessment - multiple wounds '[]'$  - 0 Dermatologic / Skin Assessment (not related to wound area) ASSESSMENTS - Focused Assessment X- 1 5 Circumferential Edema Measurements - multi extremities '[]'$  - 0 Nutritional Assessment / Counseling / Intervention X- 1 5 Lower Extremity Assessment (monofilament, tuning fork, pulses) '[]'$  - 0 Peripheral Arterial Disease Assessment (using hand held doppler) ASSESSMENTS - Ostomy and/or Continence Assessment and Care '[]'$  - 0 Incontinence Assessment and Management '[]'$  - 0 Ostomy Care Assessment and Management (repouching, etc.) PROCESS - Coordination of Care X - Simple Patient / Family Education for ongoing care 1 15 '[]'$  - 0 Complex (extensive) Patient / Family Education for ongoing care X- 1 10 Staff obtains Programmer, systems, Records, T Results / Process Orders est '[]'$  - 0 Staff telephones HHA, Nursing Homes / Clarify orders / etc '[]'$  - 0 Routine Transfer to another Facility (non-emergent condition) '[]'$  - 0 Routine Hospital Admission (non-emergent condition) '[]'$  - 0 New Admissions / Biomedical engineer / Ordering NPWT Apligraf, etc. , '[]'$  - 0 Emergency Hospital Admission (emergent condition) X- 1 10 Simple Discharge Coordination '[]'$  - 0 Complex (extensive) Discharge Coordination PROCESS - Special Needs '[]'$  - 0 Pediatric / Minor Patient Management '[]'$  - 0 Isolation Patient Management '[]'$  - 0 Hearing / Language / Visual special needs '[]'$  - 0 Assessment of Community assistance (transportation, D/C planning, etc.) '[]'$  - 0 Additional assistance / Altered  mentation '[]'$  - 0 Support Surface(s) Assessment (bed, cushion, seat, etc.) INTERVENTIONS - Wound Cleansing / Measurement '[]'$  - 0 Simple Wound Cleansing - one wound  X- 5 5 Complex Wound Cleansing - multiple wounds X- 1 5 Wound Imaging (photographs - any number of wounds) '[]'$  - 0 Wound Tracing (instead of photographs) '[]'$  - 0 Simple Wound Measurement - one wound X- 3 5 Complex Wound Measurement - multiple wounds INTERVENTIONS - Wound Dressings X - Small Wound Dressing one or multiple wounds 1 10 '[]'$  - 0 Medium Wound Dressing one or multiple wounds '[]'$  - 0 Large Wound Dressing one or multiple wounds '[]'$  - 0 Application of Medications - topical '[]'$  - 0 Application of Medications - injection INTERVENTIONS - Miscellaneous '[]'$  - 0 External ear exam '[]'$  - 0 Specimen Collection (cultures, biopsies, blood, body fluids, etc.) '[]'$  - 0 Specimen(s) / Culture(s) sent or taken to Lab for analysis '[]'$  - 0 Patient Transfer (multiple staff / Civil Service fast streamer / Similar devices) '[]'$  - 0 Simple Staple / Suture removal (25 or less) '[]'$  - 0 Complex Staple / Suture removal (26 or more) '[]'$  - 0 Hypo / Hyperglycemic Management (close monitor of Blood Glucose) '[]'$  - 0 Ankle / Brachial Index (ABI) - do not check if billed separately X- 1 5 Vital Signs Has the patient been seen at the hospital within the last three years: Yes Total Score: 145 Level Of Care: New/Established - Level 4 Electronic Signature(s) Signed: 05/11/2022 11:02:51 AM By: Baruch Gouty RN, [name] Entered By: Baruch Gouty on 05/11/2022 10:43:19 -------------------------------------------------------------------------------- Encounter Discharge Information Details Patient Name: Date of Service: [place], [name] 05/11/2022 9:45 A M Medical Record Number: [id number] Patient Account Number: [id number] Date of Birth/Sex: Treating RN: 24-Aug-1951 (71 y.o. [name] Primary Care Ivette Castronova: [name] Other Clinician: Referring  Raj Landress: Treating Lancer Thurner/Extender: Earnestine Leys in Treatment: 4 Encounter Discharge Information Items Discharge Condition: Stable Ambulatory Status: Walker Discharge Destination: Home Transportation: Private Auto Accompanied By: self Schedule Follow-up Appointment: Yes Clinical Summary of Care: Patient Declined Electronic Signature(s) Signed: 05/11/2022 11:35:51 AM By: Dellie Catholic RN Entered By: Dellie Catholic on 05/11/2022 11:35:01 -------------------------------------------------------------------------------- Lower Extremity Assessment Details Patient Name: Date of Service: [place], [name] 05/11/2022 9:45 A M Medical Record Number: [id number] Patient Account Number: [id number] Date of Birth/Sex: Treating RN: 1951/10/06 (71 y.o. [name] Primary Care Devery Murgia: [name] Other Clinician: Referring Dioselina Brumbaugh: Treating Timira Bieda/Extender: Rosalyn Charters Weeks in Treatment: 4 Edema Assessment Assessed: [Left: No] [Right: No] Edema: [Left: Ye] [Right: s] Calf Left: Right: Point of Measurement: 29 cm From Medial Instep 30.7 cm Ankle Left: Right: Point of Measurement: 18 cm From Medial Instep 18.6 cm Vascular Assessment Pulses: Dorsalis Pedis Palpable: [Left:Yes] Electronic Signature(s) Signed: 05/11/2022 11:02:51 AM By: Baruch Gouty RN, [name] Entered By: Baruch Gouty on 05/11/2022 09:57:38 -------------------------------------------------------------------------------- Multi Wound Chart Details Patient Name: Date of Service: [name], [name] 05/11/2022 9:45 A M Medical Record Number: [id number] Patient Account Number: [id number] Date of Birth/Sex: Treating RN: 06-18-1951 (71 y.o. [name] Primary Care Allayah Raineri: [name] Other Clinician: Referring Joeann Steppe: Treating Aristotelis Vilardi/Extender: Earnestine Leys in Treatment: 4 Vital Signs Height(in): 35 Pulse(bpm):  88 Weight(lbs): 137 Blood Pressure(mmHg): 97/63 Body Mass Index(BMI): 23.5 Temperature(F): 98.5 Respiratory Rate(breaths/min): 18 Photos: Left, Anterior Lower Leg Left, Posterior Lower Leg Left, Proximal, Posterior Lower Leg Wound Location: Blister Blister Shear/Friction Wounding Event: Venous Leg Ulcer Venous Leg Ulcer Inflammatory Primary Etiology: Glaucoma, Arrhythmia, Hypertension, Glaucoma, Arrhythmia, Hypertension, Glaucoma, Arrhythmia, Hypertension, Comorbid History: Peripheral Arterial Disease, Peripheral Peripheral Arterial Disease, Peripheral Peripheral Arterial Disease, Peripheral Venous Disease, Gout, Osteoarthritis Venous Disease,  Gout, Osteoarthritis Venous Disease, Gout, Osteoarthritis 01/21/2022 01/21/2022 05/01/2022 Date Acquired: 4 4 0 Weeks of Treatment: Open Open Open Wound Status: No No No Wound Recurrence: 0x0x0 2x2.3x0.1 1x1x0.1 Measurements L x W x D (cm) 0 3.613 0.785 A (cm) : rea 0 0.361 0.079 Volume (cm) : 100.00% 48.70% 64.30% % Reduction in Area: 100.00% 48.70% 64.10% % Reduction in Volume: Full Thickness Without Exposed Full Thickness Without Exposed Full Thickness Without Exposed Classification: Support Structures Support Structures Support Structures None Present Medium Small Exudate Amount: N/A Serosanguineous Serosanguineous Exudate Type: N/A red, brown red, brown Exudate Color: N/A Flat and Intact Flat and Intact Wound Margin: None Present (0%) Medium (34-66%) Large (67-100%) Granulation Amount: N/A Red Pink Granulation Quality: None Present (0%) Medium (34-66%) Small (1-33%) Necrotic Amount: Fascia: No Fat Layer (Subcutaneous Tissue): Yes Fat Layer (Subcutaneous Tissue): Yes Exposed Structures: Fat Layer (Subcutaneous Tissue): No Fascia: No Fascia: No Tendon: No Tendon: No Tendon: No Muscle: No Muscle: No Muscle: No Joint: No Joint: No Joint: No Bone: No Bone: No Bone: No Large (67-100%) Small (1-33%) Medium  (34-66%) Epithelialization: Wound Number: [id number] N/A Photos: Photos: N/A Left, Proximal, Anterior Lower Leg Left, Medial Lower Leg N/A Wound Location: Skin T ear/Laceration Shear/Friction N/A Wounding Event: Inflammatory Inflammatory N/A Primary Etiology: Glaucoma, Arrhythmia, Hypertension, Glaucoma, Arrhythmia, Hypertension, N/A Comorbid History: Peripheral Arterial Disease, Peripheral Peripheral Arterial Disease, Peripheral Venous Disease, Gout, Osteoarthritis Venous Disease, Gout, Osteoarthritis 05/01/2022 05/01/2022 N/A Date Acquired: 0 0 N/A Weeks of Treatment: Open Open N/A Wound Status: No No N/A Wound Recurrence: 0x0x0 1x0.5x0.1 N/A Measurements L x W x D (cm) 0 0.393 N/A A (cm) : rea 0 0.039 N/A Volume (cm) : 100.00% 55.30% N/A % Reduction in Area: 100.00% 55.70% N/A % Reduction in Volume: Full Thickness Without Exposed Full Thickness Without Exposed N/A Classification: Support Structures Support Structures None Present Medium N/A Exudate Amount: N/A Serosanguineous N/A Exudate Type: N/A red, brown N/A Exudate Color: N/A Flat and Intact N/A Wound Margin: None Present (0%) Large (67-100%) N/A Granulation Amount: N/A Red, Pink N/A Granulation Quality: None Present (0%) None Present (0%) N/A Necrotic Amount: Fascia: No Fat Layer (Subcutaneous Tissue): Yes N/A Exposed Structures: Fat Layer (Subcutaneous Tissue): No Fascia: No Tendon: No Tendon: No Muscle: No Muscle: No Joint: No Joint: No Bone: No Bone: No Large (67-100%) Medium (34-66%) N/A Epithelialization: Treatment Notes Electronic Signature(s) Signed: 05/11/2022 10:37:45 AM By: [name] MD FACS Signed: 05/11/2022 11:35:51 AM By: Dellie Catholic RN Entered By: [name] on 05/11/2022 10:37:44 -------------------------------------------------------------------------------- Multi-Disciplinary Care Plan Details Patient Name: Date of Service: [place], [name] 05/11/2022  9:45 A M Medical Record Number: [id number] Patient Account Number: [id number] Date of Birth/Sex: Treating RN: [birth year] (71 y.o. [name] Primary Care Stephanne Greeley: [name] Other Clinician: Referring Maverik Foot: Treating Virginia Francisco/Extender: Earnestine Leys in Treatment: 4 Multidisciplinary Care Plan reviewed with physician Active Inactive Abuse / Safety / Falls / Self Care Management Nursing Diagnoses: Potential for falls Potential for injury related to falls Goals: Patient will not experience any injury related to falls Date Initiated: 04/10/2022 Target Resolution Date: 06/19/2022 Goal Status: Active Patient/caregiver will verbalize/demonstrate measures taken to prevent injury and/or falls Date Initiated: 04/10/2022 Target Resolution Date: 06/19/2022 Goal Status: Active Interventions: Assess Activities of Daily Living upon admission and as needed Assess fall risk on admission and as needed Assess: immobility, friction, shearing, incontinence upon admission and as needed Assess impairment of mobility on admission and as needed per policy Assess personal safety  and home safety (as indicated) on admission and as needed Assess self care needs on admission and as needed Provide education on fall prevention Provide education on personal and home safety Notes: Wound/Skin Impairment Nursing Diagnoses: Impaired tissue integrity Knowledge deficit related to ulceration/compromised skin integrity Goals: Patient/caregiver will verbalize understanding of skin care regimen Date Initiated: 04/10/2022 Target Resolution Date: 06/19/2022 Goal Status: Active Interventions: Assess patient/caregiver ability to obtain necessary supplies Assess patient/caregiver ability to perform ulcer/skin care regimen upon admission and as needed Assess ulceration(s) every visit Provide education on ulcer and skin care Notes: Electronic Signature(s) Signed: 05/11/2022 11:02:51  AM By: Baruch Gouty RN, [name] Entered By: Baruch Gouty on 05/11/2022 10:15:00 -------------------------------------------------------------------------------- Pain Assessment Details Patient Name: Date of Service: [name], [name] 05/11/2022 9:45 A M Medical Record Number: [id number] Patient Account Number: [id number] Date of Birth/Sex: Treating RN: [birth year] (71 y.o. [name] Primary Care Osamah Schmader: [name] Other Clinician: Referring Tilton Marsalis: Treating Jahan Friedlander/Extender: Earnestine Leys in Treatment: 4 Active Problems Location of Pain Severity and Description of Pain Patient Has Paino Yes Site Locations Duration of the Pain. Duration of the Pain. Constant / Intermittento Intermittent Rate the pain. Current Pain Level: 0 Worst Pain Level: 3 Least Pain Level: 0 Character of Pain Describe the Pain: Sharp, Tender Pain Management and Medication Current Pain Management: Is the Current Pain Management Adequate: Adequate How does your wound impact your activities of daily livingo Sleep: No Bathing: No Appetite: No Relationship With Others: No Bladder Continence: No Emotions: No Bowel Continence: No Work: No Toileting: No Drive: No Dressing: No Hobbies: No Electronic Signature(s) Signed: 05/11/2022 11:02:51 AM By: Baruch Gouty RN, [name] Entered By: Baruch Gouty on 05/11/2022 09:48:18 -------------------------------------------------------------------------------- Patient/Caregiver Education Details Patient Name: Date of Service: [place], [name] 5/22/2023andnbsp9:45 A M Medical Record Number: [id number] Patient Account Number: [id number] Date of Birth/Gender: Treating RN: [birth year] (71 y.o. [name] Primary Care Physician: [name] Other Clinician: Referring Physician: Treating Physician/Extender: Earnestine Leys in Treatment: 4 Education Assessment Education Provided  To: Patient Education Topics Provided Venous: Methods: Explain/Verbal Responses: Reinforcements needed, State content correctly Wound/Skin Impairment: Methods: Explain/Verbal Responses: Reinforcements needed, State content correctly Electronic Signature(s) Signed: 05/11/2022 11:02:51 AM By: Baruch Gouty RN, [name] Entered By: Baruch Gouty on 05/11/2022 10:15:19 -------------------------------------------------------------------------------- Wound Assessment Details Patient Name: Date of Service: [name], [name] 05/11/2022 9:45 A M Medical Record Number: [id number] Patient Account Number: [id number] Date of Birth/Sex: Treating RN: Jan 08, 1951 (71 y.o. Martyn Malay, [name] Primary Care Simya Tercero: [name] Other Clinician: Referring Joelly Bolanos: Treating Henna Derderian/Extender: Rosalyn Charters Weeks in Treatment: 4 Wound Status Wound Number: 13 Primary Venous Leg Ulcer Etiology: Wound Location: Left, Anterior Lower Leg Wound Open Wounding Event: Blister Status: Date Acquired: 01/21/2022 Comorbid Glaucoma, Arrhythmia, Hypertension, Peripheral Arterial Disease, Weeks Of Treatment: 4 History: Peripheral Venous Disease, Gout, Osteoarthritis Clustered Wound: No Photos Wound Measurements Length: (cm) Width: (cm) Depth: (cm) Area: (cm) Volume: (cm) 0 % Reduction in Area: 100% 0 % Reduction in Volume: 100% 0 Epithelialization: Large (67-100%) 0 Tunneling: No 0 Undermining: No Wound Description Classification: Full Thickness Without Exposed Support Structures Exudate Amount: None Present Foul Odor After Cleansing: No Slough/Fibrino No Wound Bed Granulation Amount: None Present (0%) Exposed Structure Necrotic Amount: None Present (0%) Fascia Exposed: No Fat Layer (Subcutaneous Tissue) Exposed: No Tendon Exposed: No Muscle Exposed: No Joint Exposed: No Bone Exposed: No Electronic Signature(s) Signed: 05/11/2022 11:02:51 AM By: Baruch Gouty RN,  [name] Entered  By: Baruch Gouty on 05/11/2022 10:08:46 -------------------------------------------------------------------------------- Wound Assessment Details Patient Name: Date of Service: [place], [name] 05/11/2022 9:45 A M Medical Record Number: [id number] Patient Account Number: [id number] Date of Birth/Sex: Treating RN: 1951/02/12 (71 y.o. Martyn Malay, [name] Primary Care Arieon Corcoran: [name] Other Clinician: Referring Gabryelle Whitmoyer: Treating Kiarah Eckstein/Extender: Rosalyn Charters Weeks in Treatment: 4 Wound Status Wound Number: 14 Primary Venous Leg Ulcer Etiology: Wound Location: Left, Posterior Lower Leg Wound Open Wounding Event: Blister Status: Date Acquired: 01/21/2022 Comorbid Glaucoma, Arrhythmia, Hypertension, Peripheral Arterial Disease, Weeks Of Treatment: 4 History: Peripheral Venous Disease, Gout, Osteoarthritis Clustered Wound: No Photos Wound Measurements Length: (cm) 2 Width: (cm) 2.3 Depth: (cm) 0.1 Area: (cm) 3.613 Volume: (cm) 0.361 % Reduction in Area: 48.7% % Reduction in Volume: 48.7% Epithelialization: Small (1-33%) Tunneling: No Undermining: No Wound Description Classification: Full Thickness Without Exposed Support Structures Wound Margin: Flat and Intact Exudate Amount: Medium Exudate Type: Serosanguineous Exudate Color: red, brown Foul Odor After Cleansing: No Slough/Fibrino Yes Wound Bed Granulation Amount: Medium (34-66%) Exposed Structure Granulation Quality: Red Fascia Exposed: No Necrotic Amount: Medium (34-66%) Fat Layer (Subcutaneous Tissue) Exposed: Yes Necrotic Quality: Adherent Slough Tendon Exposed: No Muscle Exposed: No Joint Exposed: No Bone Exposed: No Treatment Notes Wound #14 (Lower Leg) Wound Laterality: Left, Posterior Cleanser Soap and Water Discharge Instruction: May shower and wash wound with dial antibacterial soap and water prior to dressing change. Wound Cleanser Discharge Instruction:  Cleanse the wound with wound cleanser prior to applying a clean dressing using gauze sponges, not tissue or cotton balls. Peri-Wound Care Sween Lotion (Moisturizing lotion) Discharge Instruction: Apply moisturizing lotion as directed Topical Primary Dressing KerraCel Ag Gelling Fiber Dressing, 2x2 in (silver alginate) Discharge Instruction: Apply silver alginate to wound bed as instructed Secondary Dressing ABD Pad, 8x10 Discharge Instruction: Apply over primary dressing as directed. Woven Gauze Sponge, Non-Sterile 4x4 in Discharge Instruction: Apply over primary dressing as directed. Secured With Compression Wrap Unnaboot w/Calamine, 4x10 (in/yd) Discharge Instruction: Apply Unnaboot at top of left leg Kerlix Roll 4.5x3.1 (in/yd) Discharge Instruction: Apply Kerlix and Coban compression as directed. Coban Self-Adherent Wrap 4x5 (in/yd) Discharge Instruction: Apply over Kerlix as directed. Compression Stockings Add-Ons Electronic Signature(s) Signed: 05/11/2022 11:02:51 AM By: Baruch Gouty RN, [name] Entered By: Baruch Gouty on 05/11/2022 10:09:36 -------------------------------------------------------------------------------- Wound Assessment Details Patient Name: Date of Service: [place], [name] 05/11/2022 9:45 A M Medical Record Number: [id number] Patient Account Number: [id number] Date of Birth/Sex: Treating RN: 15-Feb-1951 (71 y.o. Martyn Malay, [name] Primary Care Kirk Sampley: [name] Other Clinician: Referring Andrell Bergeson: Treating Albena Comes/Extender: Rosalyn Charters Weeks in Treatment: 4 Wound Status Wound Number: 16 Primary Inflammatory Etiology: Wound Location: Left, Proximal, Posterior Lower Leg Wound Open Wounding Event: Shear/Friction Status: Date Acquired: 05/01/2022 Comorbid Glaucoma, Arrhythmia, Hypertension, Peripheral Arterial Disease, Weeks Of Treatment: 0 History: Peripheral Venous Disease, Gout, Osteoarthritis Clustered Wound:  No Photos Wound Measurements Length: (cm) 1 Width: (cm) 1 Depth: (cm) 0.1 Area: (cm) 0.785 Volume: (cm) 0.079 % Reduction in Area: 64.3% % Reduction in Volume: 64.1% Epithelialization: Medium (34-66%) Tunneling: No Undermining: No Wound Description Classification: Full Thickness Without Exposed Support Structures Wound Margin: Flat and Intact Exudate Amount: Small Exudate Type: Serosanguineous Exudate Color: red, brown Foul Odor After Cleansing: No Slough/Fibrino Yes Wound Bed Granulation Amount: Large (67-100%) Exposed Structure Granulation Quality: Pink Fascia Exposed: No Necrotic Amount: Small (1-33%) Fat Layer (Subcutaneous Tissue) Exposed: Yes Necrotic Quality: Adherent Slough Tendon Exposed: No Muscle Exposed: No Joint Exposed: No Bone Exposed:  No Treatment Notes Wound #16 (Lower Leg) Wound Laterality: Left, Posterior, Proximal Cleanser Soap and Water Discharge Instruction: May shower and wash wound with dial antibacterial soap and water prior to dressing change. Wound Cleanser Discharge Instruction: Cleanse the wound with wound cleanser prior to applying a clean dressing using gauze sponges, not tissue or cotton balls. Peri-Wound Care Sween Lotion (Moisturizing lotion) Discharge Instruction: Apply moisturizing lotion as directed Topical Primary Dressing KerraCel Ag Gelling Fiber Dressing, 2x2 in (silver alginate) Discharge Instruction: Apply silver alginate to wound bed as instructed Secondary Dressing ABD Pad, 8x10 Discharge Instruction: Apply over primary dressing as directed. Woven Gauze Sponge, Non-Sterile 4x4 in Discharge Instruction: Apply over primary dressing as directed. Secured With Compression Wrap Unnaboot w/Calamine, 4x10 (in/yd) Discharge Instruction: Apply Unnaboot at top of left leg Kerlix Roll 4.5x3.1 (in/yd) Discharge Instruction: Apply Kerlix and Coban compression as directed. Coban Self-Adherent Wrap 4x5 (in/yd) Discharge  Instruction: Apply over Kerlix as directed. Compression Stockings Add-Ons Electronic Signature(s) Signed: 05/11/2022 11:02:51 AM By: Baruch Gouty RN, [name] Entered By: Baruch Gouty on 05/11/2022 10:10:25 -------------------------------------------------------------------------------- Wound Assessment Details Patient Name: Date of Service: [name], [name] 05/11/2022 9:45 A M Medical Record Number: [id number] Patient Account Number: [id number] Date of Birth/Sex: Treating RN: [birth year] (71 y.o. Martyn Malay, [name] Primary Care Maelin Kurkowski: [name] Other Clinician: Referring Raydel Hosick: Treating Lacrisha Bielicki/Extender: Rosalyn Charters Weeks in Treatment: 4 Wound Status Wound Number: 17 Primary Inflammatory Etiology: Wound Location: Left, Proximal, Anterior Lower Leg Wound Open Wounding Event: Skin Tear/Laceration Status: Date Acquired: 05/01/2022 Comorbid Glaucoma, Arrhythmia, Hypertension, Peripheral Arterial Disease, Weeks Of Treatment: 0 History: Peripheral Venous Disease, Gout, Osteoarthritis Clustered Wound: No Photos Wound Measurements Length: (cm) Width: (cm) Depth: (cm) Area: (cm) Volume: (cm) 0 % Reduction in Area: 100% 0 % Reduction in Volume: 100% 0 Epithelialization: Large (67-100%) 0 Tunneling: No 0 Undermining: No Wound Description Classification: Full Thickness Without Exposed Support Structures Exudate Amount: None Present Foul Odor After Cleansing: No Slough/Fibrino No Wound Bed Granulation Amount: None Present (0%) Exposed Structure Necrotic Amount: None Present (0%) Fascia Exposed: No Fat Layer (Subcutaneous Tissue) Exposed: No Tendon Exposed: No Muscle Exposed: No Joint Exposed: No Bone Exposed: No Electronic Signature(s) Signed: 05/11/2022 11:02:51 AM By: Baruch Gouty RN, [name] Entered By: Baruch Gouty on 05/11/2022 10:10:55 -------------------------------------------------------------------------------- Wound  Assessment Details Patient Name: Date of Service: [name], [name] 05/11/2022 9:45 A M Medical Record Number: [id number] Patient Account Number: [id number] Date of Birth/Sex: Treating RN: 06/26/1951 (71 y.o. Martyn Malay, [name] Primary Care Danil Wedge: [name] Other Clinician: Referring Alanna Storti: Treating Myelle Poteat/Extender: Rosalyn Charters Weeks in Treatment: 4 Wound Status Wound Number: [id number] Primary Inflammatory Etiology: Wound Location: Left, Medial Lower Leg Wound Open Wounding Event: Shear/Friction Status: Date Acquired: 05/01/2022 Comorbid Glaucoma, Arrhythmia, Hypertension, Peripheral Arterial Disease, Weeks Of Treatment: 0 History: Peripheral Venous Disease, Gout, Osteoarthritis Clustered Wound: No Photos Wound Measurements Length: (cm) 1 Width: (cm) 0.5 Depth: (cm) 0.1 Area: (cm) 0.393 Volume: (cm) 0.039 % Reduction in Area: 55.3% % Reduction in Volume: 55.7% Epithelialization: Medium (34-66%) Tunneling: No Undermining: No Wound Description Classification: Full Thickness Without Exposed Support Structures Wound Margin: Flat and Intact Exudate Amount: Medium Exudate Type: Serosanguineous Exudate Color: red, brown Foul Odor After Cleansing: No Slough/Fibrino Yes Wound Bed Granulation Amount: Large (67-100%) Exposed Structure Granulation Quality: Red, Pink Fascia Exposed: No Necrotic Amount: None Present (0%) Fat Layer (Subcutaneous Tissue) Exposed: Yes Tendon Exposed: No Muscle Exposed: No Joint Exposed: No Bone Exposed: No Treatment Notes Wound #18 (  Lower Leg) Wound Laterality: Left, Medial Cleanser Soap and Water Discharge Instruction: May shower and wash wound with dial antibacterial soap and water prior to dressing change. Wound Cleanser Discharge Instruction: Cleanse the wound with wound cleanser prior to applying a clean dressing using gauze sponges, not tissue or cotton balls. Peri-Wound Care Sween Lotion (Moisturizing  lotion) Discharge Instruction: Apply moisturizing lotion as directed Topical Primary Dressing KerraCel Ag Gelling Fiber Dressing, 2x2 in (silver alginate) Discharge Instruction: Apply silver alginate to wound bed as instructed Secondary Dressing ABD Pad, 8x10 Discharge Instruction: Apply over primary dressing as directed. Woven Gauze Sponge, Non-Sterile 4x4 in Discharge Instruction: Apply over primary dressing as directed. Secured With Compression Wrap Unnaboot w/Calamine, 4x10 (in/yd) Discharge Instruction: Apply Unnaboot at top of left leg Kerlix Roll 4.5x3.1 (in/yd) Discharge Instruction: Apply Kerlix and Coban compression as directed. Coban Self-Adherent Wrap 4x5 (in/yd) Discharge Instruction: Apply over Kerlix as directed. Compression Stockings Add-Ons Electronic Signature(s) Signed: 05/11/2022 11:02:51 AM By: Baruch Gouty RN, [name] Entered By: Baruch Gouty on 05/11/2022 10:11:34 -------------------------------------------------------------------------------- Vitals Details Patient Name: Date of Service: [name], [name] 05/11/2022 9:45 A M Medical Record Number: [id number] Patient Account Number: [id number] Date of Birth/Sex: Treating RN: 03-07-1951 (71 y.o. [name] Primary Care Lynnet Hefley: [name] Other Clinician: Referring Eleno Weimar: Treating Breydon Senters/Extender: Earnestine Leys in Treatment: 4 Vital Signs Time Taken: 09:47 Temperature (F): 98.5 Height (in): 64 Pulse (bpm): 72 Weight (lbs): 137 Respiratory Rate (breaths/min): 18 Body Mass Index (BMI): 23.5 Blood Pressure (mmHg): 97/63 Reference Range: 80 - 120 mg / dl Electronic Signature(s) Signed: 05/11/2022 11:02:51 AM By: Baruch Gouty RN, [name] Entered By: Baruch Gouty on 05/11/2022 09:47:45
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
## 2022-05-19 ENCOUNTER — Encounter (HOSPITAL_BASED_OUTPATIENT_CLINIC_OR_DEPARTMENT_OTHER): Payer: 59 | Admitting: General Surgery

## 2022-05-19 DIAGNOSIS — I739 Peripheral vascular disease, unspecified: Secondary | ICD-10-CM | POA: Diagnosis not present
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
## 2022-05-19 NOTE — Progress Notes (Signed)
[name], [name] ([id number]) Visit Report for 05/19/2022 Arrival Information Details Patient Name: Date of Service: [place], [name] 05/19/2022 10:45 A M Medical Record Number: [id number] Patient Account Number: [id number] Date of Birth/Sex: Treating RN: 24-Dec-1950 (71 y.o. [name], [name] Primary Care Marie Borowski: [name] Other Clinician: Referring Kayd Launer: Treating Bradley Handyside/Extender: Earnestine Leys in Treatment: 5 Visit Information History Since Last Visit Added or deleted any medications: No Patient Arrived: [name] Any new allergies or adverse reactions: No Arrival Time: 08:54 Had a fall or experienced change in No Accompanied By: self activities of daily living that may affect Transfer Assistance: None risk of falls: Patient Identification Verified: Yes Signs or symptoms of abuse/neglect since last visito No Secondary Verification Process Completed: Yes Hospitalized since last visit: No Patient Requires Transmission-Based Precautions: No Implantable device outside of the clinic excluding No Patient Has Alerts: No cellular tissue based products placed in the center since last visit: Has Dressing in Place as Prescribed: Yes Has Compression in Place as Prescribed: Yes Pain Present Now: No Electronic Signature(s) Signed: 05/19/2022 4:30:31 PM By: Sharyn Creamer RN, [name] Entered By: Sharyn Creamer on 05/19/2022 08:55:28 -------------------------------------------------------------------------------- Encounter Discharge Information Details Patient Name: Date of Service: [place], [name] 05/19/2022 10:45 A M Medical Record Number: [id number] Patient Account Number: [id number] Date of Birth/Sex: Treating RN: [birth year] (71 y.o. [name] Primary Care Diany Formosa: [name] Other Clinician: Referring Telesforo Brosnahan: Treating Makya Yurko/Extender: Earnestine Leys in Treatment: 5 Encounter Discharge Information Items Post  Procedure Vitals Discharge Condition: Stable Temperature (F): 98 Ambulatory Status: Walker Pulse (bpm): 86 Discharge Destination: Home Respiratory Rate (breaths/min): 18 Transportation: Private Auto Blood Pressure (mmHg): 113/71 Accompanied By: self Schedule Follow-up Appointment: Yes Clinical Summary of Care: Patient Declined Electronic Signature(s) Signed: 05/19/2022 4:02:28 PM By: Adline Peals Entered By: Adline Peals on 05/19/2022 09:25:29 -------------------------------------------------------------------------------- Lower Extremity Assessment Details Patient Name: Date of Service: [name], [name] 05/19/2022 10:45 A M Medical Record Number: [id number] Patient Account Number: [id number] Date of Birth/Sex: Treating RN: 30-May-1951 (71 y.o. [name] Primary Care Sosie Gato: [name] Other Clinician: Referring Auriah Hollings: Treating Alyx Mcguirk/Extender: Rosalyn Charters Weeks in Treatment: 5 Edema Assessment Assessed: [Left: No] [Right: No] Edema: [Left: Ye] [Right: s] Calf Left: Right: Point of Measurement: 29 cm From Medial Instep 29 cm Ankle Left: Right: Point of Measurement: 18 cm From Medial Instep 18.2 cm Electronic Signature(s) Signed: 05/19/2022 4:30:31 PM By: Sharyn Creamer RN, [name] Entered By: Sharyn Creamer on 05/19/2022 09:01:16 -------------------------------------------------------------------------------- Multi Wound Chart Details Patient Name: Date of Service: [name], [name] 05/19/2022 10:45 A M Medical Record Number: [id number] Patient Account Number: [id number] Date of Birth/Sex: Treating RN: Apr 23, 1951 (71 y.o. [name] Primary Care [name]: [name] Other Clinician: Referring Vlada Uriostegui: Treating Dollye Glasser/Extender: Earnestine Leys in Treatment: 5 Vital Signs Height(in): 64 Pulse(bpm): 63 Weight(lbs): 137 Blood Pressure(mmHg): 113/71 Body Mass Index(BMI):  23.5 Temperature(F): 98.0 Respiratory Rate(breaths/min): 18 Photos: Left, Posterior Lower Leg Left, Proximal, Posterior Lower Leg Left, Medial Lower Leg Wound Location: Blister Shear/Friction Shear/Friction Wounding Event: Venous Leg Ulcer Inflammatory Inflammatory Primary Etiology: Glaucoma, Arrhythmia, Hypertension, Glaucoma, Arrhythmia, Hypertension, Glaucoma, Arrhythmia, Hypertension, Comorbid History: Peripheral Arterial Disease, Peripheral Peripheral Arterial Disease, Peripheral Peripheral Arterial Disease, Peripheral Venous Disease, Gout, Osteoarthritis Venous Disease, Gout, Osteoarthritis Venous Disease, Gout, Osteoarthritis 01/21/2022 05/01/2022 05/01/2022 Date Acquired: '5 2 2 '$ Weeks of Treatment: Open Healed - Epithelialized Healed - Epithelialized Wound Status: No No No Wound Recurrence: 1.3x1.8x0.1 0x0x0  0x0x0 Measurements L x W x D (cm) 1.838 0 0 A (cm) : rea 0.184 0 0 Volume (cm) : 73.90% 100.00% 100.00% % Reduction in Area: 73.90% 100.00% 100.00% % Reduction in Volume: Full Thickness Without Exposed Full Thickness Without Exposed Full Thickness Without Exposed Classification: Support Structures Support Structures Support Structures Medium None Present None Present Exudate A mount: Serosanguineous N/A N/A Exudate Type: red, brown N/A N/A Exudate Color: Flat and Intact Flat and Intact Flat and Intact Wound Margin: Large (67-100%) None Present (0%) None Present (0%) Granulation A mount: Red, Pink N/A N/A Granulation Quality: Small (1-33%) None Present (0%) None Present (0%) Necrotic A mount: Fat Layer (Subcutaneous Tissue): Yes Fascia: No Fascia: No Exposed Structures: Fascia: No Fat Layer (Subcutaneous Tissue): No Fat Layer (Subcutaneous Tissue): No Tendon: No Tendon: No Tendon: No Muscle: No Muscle: No Muscle: No Joint: No Joint: No Joint: No Bone: No Bone: No Bone: No Medium (34-66%) Large (67-100%) Large  (67-100%) Epithelialization: Debridement - Selective/Open Wound N/A N/A Debridement: Pre-procedure Verification/Time Out 09:10 N/A N/A Taken: Other N/A N/A Pain Control: Necrotic/Eschar, Slough N/A N/A Tissue Debrided: Non-Viable Tissue N/A N/A Level: 2.34 N/A N/A Debridement A (sq cm): rea Curette N/A N/A Instrument: Minimum N/A N/A Bleeding: Pressure N/A N/A Hemostasis A chieved: 0 N/A N/A Procedural Pain: 0 N/A N/A Post Procedural Pain: Procedure was tolerated well N/A N/A Debridement Treatment Response: 1.3x1.8x0.1 N/A N/A Post Debridement Measurements L x W x D (cm) 0.184 N/A N/A Post Debridement Volume: (cm) Debridement N/A N/A Procedures Performed: Treatment Notes Electronic Signature(s) Signed: 05/19/2022 9:13:03 AM By: [name] MD FACS Signed: 05/19/2022 4:02:28 PM By: Adline Peals Entered By: [name] on 05/19/2022 09:13:03 -------------------------------------------------------------------------------- Multi-Disciplinary Care Plan Details Patient Name: Date of Service: [place], [name] 05/19/2022 10:45 A M Medical Record Number: [id number] Patient Account Number: [id number] Date of Birth/Sex: Treating RN: [birth year] (71 y.o. [name] Primary Care Shyheem Whitham: [name] Other Clinician: Referring Soha Thorup: Treating Hayslee Casebolt/Extender: Earnestine Leys in Treatment: 5 Multidisciplinary Care Plan reviewed with physician Active Inactive Abuse / Safety / Falls / Self Care Management Nursing Diagnoses: Potential for falls Potential for injury related to falls Goals: Patient will not experience any injury related to falls Date Initiated: 04/10/2022 Target Resolution Date: 06/19/2022 Goal Status: Active Patient/caregiver will verbalize/demonstrate measures taken to prevent injury and/or falls Date Initiated: 04/10/2022 Target Resolution Date: 06/19/2022 Goal Status: Active Interventions: Assess  Activities of Daily Living upon admission and as needed Assess fall risk on admission and as needed Assess: immobility, friction, shearing, incontinence upon admission and as needed Assess impairment of mobility on admission and as needed per policy Assess personal safety and home safety (as indicated) on admission and as needed Assess self care needs on admission and as needed Provide education on fall prevention Provide education on personal and home safety Notes: Wound/Skin Impairment Nursing Diagnoses: Impaired tissue integrity Knowledge deficit related to ulceration/compromised skin integrity Goals: Patient/caregiver will verbalize understanding of skin care regimen Date Initiated: 04/10/2022 Target Resolution Date: 06/19/2022 Goal Status: Active Interventions: Assess patient/caregiver ability to obtain necessary supplies Assess patient/caregiver ability to perform ulcer/skin care regimen upon admission and as needed Assess ulceration(s) every visit Provide education on ulcer and skin care Notes: Electronic Signature(s) Signed: 05/19/2022 4:30:31 PM By: Sharyn Creamer RN, [name] Entered By: Sharyn Creamer on 05/19/2022 09:05:55 -------------------------------------------------------------------------------- Pain Assessment Details Patient Name: Date of Service: [name], [place] B. 05/19/2022 10:45 A M Medical Record Number: [id number] Patient Account Number:  [id number] Date of Birth/Sex: Treating RN: 27-Jan-1951 (71 y.o. [name] Primary Care Anesha Hackert: [name] Other Clinician: Referring Cardale Dorer: Treating Magdalynn Davilla/Extender: Earnestine Leys in Treatment: 5 Active Problems Location of Pain Severity and Description of Pain Patient Has Paino No Site Locations Pain Management and Medication Current Pain Management: Electronic Signature(s) Signed: 05/19/2022 4:30:31 PM By: Sharyn Creamer RN, [name] Entered By: Sharyn Creamer on 05/19/2022  08:57:35 -------------------------------------------------------------------------------- Patient/Caregiver Education Details Patient Name: Date of Service: Marguerita [place], [name] 5/30/2023andnbsp10:45 A M Medical Record Number: [id number] Patient Account Number: [id number] Date of Birth/Gender: Treating RN: 07-25-1951 (71 y.o. [name] Primary Care Physician: [name] Other Clinician: Referring Physician: Treating Physician/Extender: Earnestine Leys in Treatment: 5 Education Assessment Education Provided To: Patient Education Topics Provided Wound/Skin Impairment: Methods: Explain/Verbal Responses: State content correctly Electronic Signature(s) Signed: 05/19/2022 4:30:31 PM By: Sharyn Creamer RN, [name] Entered By: Sharyn Creamer on 05/19/2022 09:06:19 -------------------------------------------------------------------------------- Wound Assessment Details Patient Name: Date of Service: [name], [name] 05/19/2022 10:45 A M Medical Record Number: [id number] Patient Account Number: [id number] Date of Birth/Sex: Treating RN: September 16, 1951 (71 y.o. [name] Primary Care Raford Brissett: [name] Other Clinician: Referring Mailin Coglianese: Treating Malicia Blasdel/Extender: Rosalyn Charters Weeks in Treatment: 5 Wound Status Wound Number: 14 Primary Venous Leg Ulcer Etiology: Wound Location: Left, Posterior Lower Leg Wound Open Wounding Event: Blister Status: Date Acquired: 01/21/2022 Comorbid Glaucoma, Arrhythmia, Hypertension, Peripheral Arterial Disease, Weeks Of Treatment: 5 History: Peripheral Venous Disease, Gout, Osteoarthritis Clustered Wound: No Photos Wound Measurements Length: (cm) 1.3 Width: (cm) 1.8 Depth: (cm) 0.1 Area: (cm) 1.838 Volume: (cm) 0.184 % Reduction in Area: 73.9% % Reduction in Volume: 73.9% Epithelialization: Medium (34-66%) Wound Description Classification: Full Thickness Without Exposed Support  Structures Wound Margin: Flat and Intact Exudate Amount: Medium Exudate Type: Serosanguineous Exudate Color: red, brown Foul Odor After Cleansing: No Slough/Fibrino Yes Wound Bed Granulation Amount: Large (67-100%) Exposed Structure Granulation Quality: Red, Pink Fascia Exposed: No Necrotic Amount: Small (1-33%) Fat Layer (Subcutaneous Tissue) Exposed: Yes Necrotic Quality: Adherent Slough Tendon Exposed: No Muscle Exposed: No Joint Exposed: No Bone Exposed: No Treatment Notes Wound #14 (Lower Leg) Wound Laterality: Left, Posterior Cleanser Soap and Water Discharge Instruction: May shower and wash wound with dial antibacterial soap and water prior to dressing change. Wound Cleanser Discharge Instruction: Cleanse the wound with wound cleanser prior to applying a clean dressing using gauze sponges, not tissue or cotton [name] Peri-Wound Care Sween Lotion (Moisturizing lotion) Discharge Instruction: Apply moisturizing lotion as directed Topical Primary Dressing KerraCel Ag Gelling Fiber Dressing, 2x2 in (silver alginate) Discharge Instruction: Apply silver alginate to wound bed as instructed Secondary Dressing ABD Pad, 8x10 Discharge Instruction: Apply over primary dressing as directed. Woven Gauze Sponge, Non-Sterile 4x4 in Discharge Instruction: Apply over primary dressing as directed. Secured With Compression Wrap Kerlix Roll 4.5x3.1 (in/yd) Discharge Instruction: Apply Kerlix and Coban compression as directed. Coban Self-Adherent Wrap 4x5 (in/yd) Discharge Instruction: Apply over Kerlix as directed. Compression Stockings Add-Ons Electronic Signature(s) Signed: 05/19/2022 4:30:31 PM By: Sharyn Creamer RN, [name] Entered By: Sharyn Creamer on 05/19/2022 09:07:08 -------------------------------------------------------------------------------- Wound Assessment Details Patient Name: Date of Service: [name], [name] 05/19/2022 10:45 A M Medical Record Number:  [id number] Patient Account Number: [id number] Date of Birth/Sex: Treating RN: 1950/12/28 (71 y.o. [name] Primary Care Hakop Humbarger: [name] Other Clinician: Referring Ricquel Foulk: Treating Jaycub Noorani/Extender: Rosalyn Charters Weeks in Treatment: 5 Wound Status Wound Number: 16 Primary Inflammatory Etiology:  Wound Location: Left, Proximal, Posterior Lower Leg Wound Healed - Epithelialized Wounding Event: Shear/Friction Status: Date Acquired: 05/01/2022 Comorbid Glaucoma, Arrhythmia, Hypertension, Peripheral Arterial Disease, Weeks Of Treatment: 2 History: Peripheral Venous Disease, Gout, Osteoarthritis Clustered Wound: No Photos Wound Measurements Length: (cm) Width: (cm) Depth: (cm) Area: (cm) Volume: (cm) 0 % Reduction in Area: 100% 0 % Reduction in Volume: 100% 0 Epithelialization: Large (67-100%) 0 Tunneling: No 0 Undermining: No Wound Description Classification: Full Thickness Without Exposed Support Structures Wound Margin: Flat and Intact Exudate Amount: None Present Foul Odor After Cleansing: No Slough/Fibrino No Wound Bed Granulation Amount: None Present (0%) Exposed Structure Necrotic Amount: None Present (0%) Fascia Exposed: No Fat Layer (Subcutaneous Tissue) Exposed: No Tendon Exposed: No Muscle Exposed: No Joint Exposed: No Bone Exposed: No Electronic Signature(s) Signed: 05/19/2022 4:30:31 PM By: Sharyn Creamer RN, [name] Entered By: Sharyn Creamer on 05/19/2022 09:05:29 -------------------------------------------------------------------------------- Wound Assessment Details Patient Name: Date of Service: [name], [name] 05/19/2022 10:45 A M Medical Record Number: [id number] Patient Account Number: [id number] Date of Birth/Sex: Treating RN: 01/11/1951 (71 y.o. [name] Primary Care Darrell Leonhardt: [name] Other Clinician: Referring Kelcy Baeten: Treating Deann Mclaine/Extender: Rosalyn Charters Weeks in  Treatment: 5 Wound Status Wound Number: 18 Primary Inflammatory Etiology: Wound Location: Left, Medial Lower Leg Wound Healed - Epithelialized Wounding Event: Shear/Friction Status: Date Acquired: 05/01/2022 Comorbid Glaucoma, Arrhythmia, Hypertension, Peripheral Arterial Disease, Weeks Of Treatment: 2 History: Peripheral Venous Disease, Gout, Osteoarthritis Clustered Wound: No Photos Wound Measurements Length: (cm) Width: (cm) Depth: (cm) Area: (cm) Volume: (cm) 0 % Reduction in Area: 100% 0 % Reduction in Volume: 100% 0 Epithelialization: Large (67-100%) 0 Tunneling: No 0 Undermining: No Wound Description Classification: Full Thickness Without Exposed Support Structures Wound Margin: Flat and Intact Exudate Amount: None Present Foul Odor After Cleansing: No Slough/Fibrino No Wound Bed Granulation Amount: None Present (0%) Exposed Structure Necrotic Amount: None Present (0%) Fascia Exposed: No Fat Layer (Subcutaneous Tissue) Exposed: No Tendon Exposed: No Muscle Exposed: No Joint Exposed: No Bone Exposed: No Electronic Signature(s) Signed: 05/19/2022 4:30:31 PM By: Sharyn Creamer RN, [name] Entered By: Sharyn Creamer on 05/19/2022 09:06:13 -------------------------------------------------------------------------------- Justice Details Patient Name: Date of Service: [name], [name] 05/19/2022 10:45 A M Medical Record Number: [id number] Patient Account Number: [id number] Date of Birth/Sex: Treating RN: [birth year] (71 y.o. [name] Primary Care Sharni Negron: [name] Other Clinician: Referring Amanat Hackel: Treating Gladstone Rosas/Extender: Earnestine Leys in Treatment: 5 Vital Signs Time Taken: 08:55 Temperature (F): 98.0 Height (in): 64 Pulse (bpm): 86 Weight (lbs): 137 Respiratory Rate (breaths/min): 18 Body Mass Index (BMI): 23.5 Blood Pressure (mmHg): 113/71 Reference Range: 80 - 120 mg / dl Electronic Signature(s) Signed:  05/19/2022 4:30:31 PM By: Sharyn Creamer RN, [name] Entered By: Sharyn Creamer on 05/19/2022 08:57:27

## 2022-05-25 ENCOUNTER — Other Ambulatory Visit: Payer: Self-pay | Admitting: Nurse Practitioner

## 2022-05-25 ENCOUNTER — Encounter (HOSPITAL_BASED_OUTPATIENT_CLINIC_OR_DEPARTMENT_OTHER): Payer: 59 | Attending: General Surgery | Admitting: General Surgery

## 2022-05-25 DIAGNOSIS — I2510 Atherosclerotic heart disease of native coronary artery without angina pectoris: Secondary | ICD-10-CM | POA: Diagnosis not present

## 2022-05-25 DIAGNOSIS — L97222 Non-pressure chronic ulcer of left calf with fat layer exposed: Secondary | ICD-10-CM | POA: Insufficient documentation

## 2022-05-25 DIAGNOSIS — I10 Essential (primary) hypertension: Secondary | ICD-10-CM | POA: Insufficient documentation

## 2022-05-25 DIAGNOSIS — I872 Venous insufficiency (chronic) (peripheral): Secondary | ICD-10-CM | POA: Insufficient documentation

## 2022-05-25 DIAGNOSIS — Z89611 Acquired absence of right leg above knee: Secondary | ICD-10-CM | POA: Diagnosis not present

## 2022-05-26 NOTE — Progress Notes (Signed)
FREYJA, GOVEA ([id number]) Visit Report for 05/25/2022 Arrival Information Details Patient Name: Date of Service: [place], [name] 05/25/2022 10:45 A M Medical Record Number: [id number] Patient Account Number: [id number] Date of Birth/Sex: Treating RN: Sep 02, 1951 (71 y.o. Helene Shoe, Meta.Reding Primary Care Jacque Garrels: [name] Other Clinician: Referring Dmya Long: Treating Lasonja Lakins/Extender: Earnestine Leys in Treatment: 6 Visit Information History Since Last Visit Added or deleted any medications: No Patient Arrived: [place] Any new allergies or adverse reactions: No Arrival Time: 10:40 Had a fall or experienced change in No Accompanied By: self activities of daily living that may affect Transfer Assistance: None risk of falls: Patient Identification Verified: Yes Signs or symptoms of abuse/neglect since last visito No Secondary Verification Process Completed: Yes Hospitalized since last visit: No Patient Requires Transmission-Based Precautions: No Implantable device outside of the clinic excluding No Patient Has Alerts: No cellular tissue based products placed in the center since last visit: Has Dressing in Place as Prescribed: Yes Has Compression in Place as Prescribed: Yes Pain Present Now: No Electronic Signature(s) Signed: 05/25/2022 3:50:54 PM By: Deon Pilling RN, [name] Entered By: Deon Pilling on 05/25/2022 10:40:34 -------------------------------------------------------------------------------- Encounter Discharge Information Details Patient Name: Date of Service: [name], [name] 05/25/2022 10:45 A M Medical Record Number: [id number] Patient Account Number: [id number] Date of Birth/Sex: Treating RN: Aug 14, 1951 (71 y.o. Debby Bud Primary Care Davine Coba: [name] Other Clinician: Referring Jamilette Suchocki: Treating Lashya Passe/Extender: Earnestine Leys in Treatment: 6 Encounter Discharge Information Items Post Procedure  Vitals Discharge Condition: Stable Temperature (F): 98.7 Ambulatory Status: Walker Pulse (bpm): 82 Discharge Destination: Home Respiratory Rate (breaths/min): 16 Transportation: Private Auto Blood Pressure (mmHg): 128/83 Accompanied By: self Schedule Follow-up Appointment: Yes Clinical Summary of Care: Electronic Signature(s) Signed: 05/25/2022 3:50:54 PM By: Deon Pilling RN, [name] Entered By: Deon Pilling on 05/25/2022 10:59:02 -------------------------------------------------------------------------------- Lower Extremity Assessment Details Patient Name: Date of Service: [place], [name] 05/25/2022 10:45 A M Medical Record Number: [id number] Patient Account Number: [id number] Date of Birth/Sex: Treating RN: 1951-11-09 (71 y.o. Debby Bud Primary Care Kerrie Timm: [name] Other Clinician: Referring Heidemarie Goodnow: Treating Cortavius Montesinos/Extender: Rosalyn Charters Weeks in Treatment: 6 Edema Assessment Assessed: [Left: Yes] [Right: No] Edema: [Left: N] [Right: o] Calf Left: Right: Point of Measurement: 29 cm From Medial Instep 32 cm Ankle Left: Right: Point of Measurement: 18 cm From Medial Instep 18 cm Vascular Assessment Pulses: Dorsalis Pedis Palpable: [Left:Yes] Electronic Signature(s) Signed: 05/25/2022 3:50:54 PM By: Deon Pilling RN, [name] Entered By: Deon Pilling on 05/25/2022 10:47:25 -------------------------------------------------------------------------------- Multi Wound Chart Details Patient Name: Date of Service: [name], [name] 05/25/2022 10:45 A M Medical Record Number: [id number] Patient Account Number: [id number] Date of Birth/Sex: Treating RN: 12-24-1950 (71 y.o. [name] Primary Care Calyn Sivils: [name] Other Clinician: Referring Yzabelle Calles: Treating Denaisha Swango/Extender: Earnestine Leys in Treatment: 6 Vital Signs Height(in): 64 Pulse(bpm): 92 Weight(lbs): 137 Blood Pressure(mmHg):  128/83 Body Mass Index(BMI): 23.5 Temperature(F): 98.7 Respiratory Rate(breaths/min): 16 Photos: [N/A:N/A] Left, Posterior Lower Leg N/A N/A Wound Location: Blister N/A N/A Wounding Event: Venous Leg Ulcer N/A N/A Primary Etiology: Glaucoma, Arrhythmia, Hypertension, N/A N/A Comorbid History: Peripheral Arterial Disease, Peripheral Venous Disease, Gout, Osteoarthritis 01/21/2022 N/A N/A Date Acquired: 6 N/A N/A Weeks of Treatment: Open N/A N/A Wound Status: No N/A N/A Wound Recurrence: 0.9x1.5x0.1 N/A N/A Measurements L x W x D (cm) 1.06 N/A N/A A (cm) : rea 0.106 N/A N/A Volume (cm) : 84.90% N/A  N/A % Reduction in A rea: 84.90% N/A N/A % Reduction in Volume: Full Thickness Without Exposed N/A N/A Classification: Support Structures Medium N/A N/A Exudate A mount: Serosanguineous N/A N/A Exudate Type: red, brown N/A N/A Exudate Color: Flat and Intact N/A N/A Wound Margin: Medium (34-66%) N/A N/A Granulation A mount: Red, Pink N/A N/A Granulation Quality: Medium (34-66%) N/A N/A Necrotic A mount: Fat Layer (Subcutaneous Tissue): Yes N/A N/A Exposed Structures: Fascia: No Tendon: No Muscle: No Joint: No Bone: No Medium (34-66%) N/A N/A Epithelialization: Debridement - Excisional N/A N/A Debridement: Pre-procedure Verification/Time Out 10:50 N/A N/A Taken: Other N/A N/A Pain Control: Subcutaneous, Slough N/A N/A Tissue Debrided: Skin/Subcutaneous Tissue N/A N/A Level: 1.35 N/A N/A Debridement A (sq cm): rea Curette N/A N/A Instrument: Minimum N/A N/A Bleeding: Pressure N/A N/A Hemostasis A chieved: 0 N/A N/A Procedural Pain: 0 N/A N/A Post Procedural Pain: Procedure was tolerated well N/A N/A Debridement Treatment Response: 0.9x1.5x0.1 N/A N/A Post Debridement Measurements L x W x D (cm) 0.106 N/A N/A Post Debridement Volume: (cm) Debridement N/A N/A Procedures Performed: Treatment Notes Wound #14 (Lower Leg) Wound  Laterality: Left, Posterior Cleanser Soap and Water Discharge Instruction: May shower and wash wound with dial antibacterial soap and water prior to dressing change. Wound Cleanser Discharge Instruction: Cleanse the wound with wound cleanser prior to applying a clean dressing using gauze sponges, not tissue or cotton balls. Peri-Wound Care Triamcinolone 15 (g) Discharge Instruction: Use triamcinolone 15 (g) as directed Sween Lotion (Moisturizing lotion) Discharge Instruction: Apply moisturizing lotion as directed Topical Primary Dressing KerraCel Ag Gelling Fiber Dressing, 2x2 in (silver alginate) Discharge Instruction: Apply silver alginate to wound bed as instructed Secondary Dressing ABD Pad, 8x10 Discharge Instruction: Apply over primary dressing as directed. Woven Gauze Sponge, Non-Sterile 4x4 in Discharge Instruction: Apply over primary dressing as directed. Secured With Compression Wrap Coban Self-Adherent Wrap 4x5 (in/yd) Discharge Instruction: Apply over Kerlix as directed. cotton layer Discharge Instruction: ****USE COTTON LAYER INSTEAD OF KERLIX. Compression Stockings Add-Ons Electronic Signature(s) Signed: 05/25/2022 11:09:06 AM By: [name] MD FACS Signed: 05/26/2022 5:21:47 PM By: Sabas Sous By: [name] on 05/25/2022 11:09:06 -------------------------------------------------------------------------------- Multi-Disciplinary Care Plan Details Patient Name: Date of Service: MO [place], [name] 05/25/2022 10:45 A M Medical Record Number: [id number] Patient Account Number: [id number] Date of Birth/Sex: Treating RN: 26-Mar-1951 (71 y.o. Helene Shoe, Tammi Klippel Primary Care Tracey Stewart: [name] Other Clinician: Referring Jeweline Reif: Treating Makale Pindell/Extender: Earnestine Leys in Treatment: 6 Multidisciplinary Care Plan reviewed with physician Active Inactive Abuse / Safety / Falls / Self Care Management Nursing  Diagnoses: Potential for falls Potential for injury related to falls Goals: Patient will not experience any injury related to falls Date Initiated: 04/10/2022 Target Resolution Date: 06/19/2022 Goal Status: Active Patient/caregiver will verbalize/demonstrate measures taken to prevent injury and/or falls Date Initiated: 04/10/2022 Target Resolution Date: 06/19/2022 Goal Status: Active Interventions: Assess Activities of Daily Living upon admission and as needed Assess fall risk on admission and as needed Assess: immobility, friction, shearing, incontinence upon admission and as needed Assess impairment of mobility on admission and as needed per policy Assess personal safety and home safety (as indicated) on admission and as needed Assess self care needs on admission and as needed Provide education on fall prevention Provide education on personal and home safety Notes: Wound/Skin Impairment Nursing Diagnoses: Impaired tissue integrity Knowledge deficit related to ulceration/compromised skin integrity Goals: Patient/caregiver will verbalize understanding of skin care regimen Date Initiated: 04/10/2022 Target Resolution Date: 06/19/2022 Goal  Status: Active Interventions: Assess patient/caregiver ability to obtain necessary supplies Assess patient/caregiver ability to perform ulcer/skin care regimen upon admission and as needed Assess ulceration(s) every visit Provide education on ulcer and skin care Notes: Electronic Signature(s) Signed: 05/25/2022 3:50:54 PM By: Deon Pilling RN, [name] Entered By: Deon Pilling on 05/25/2022 10:48:55 -------------------------------------------------------------------------------- Pain Assessment Details Patient Name: Date of Service: [name], [place] B. 05/25/2022 10:45 A M Medical Record Number: [id number] Patient Account Number: [id number] Date of Birth/Sex: Treating RN: 12-Jan-1951 (71 y.o. Debby Bud Primary Care Attikus Bartoszek: [name] Other  Clinician: Referring Maxfield Gildersleeve: Treating Elektra Wartman/Extender: Earnestine Leys in Treatment: 6 Active Problems Location of Pain Severity and Description of Pain Patient Has Paino No Site Locations Rate the pain. Current Pain Level: 0 Pain Management and Medication Current Pain Management: Medication: No Cold Application: No Rest: No Massage: No Activity: No T.E.N.S.: No Heat Application: No Leg drop or elevation: No Is the Current Pain Management Adequate: Adequate How does your wound impact your activities of daily livingo Sleep: No Bathing: No Appetite: No Relationship With Others: No Bladder Continence: No Emotions: No Bowel Continence: No Work: No Toileting: No Drive: No Dressing: No Hobbies: No Engineer, maintenance) Signed: 05/25/2022 3:50:54 PM By: Deon Pilling RN, [name] Entered By: Deon Pilling on 05/25/2022 10:40:46 -------------------------------------------------------------------------------- Patient/Caregiver Education Details Patient Name: Date of Service: Marguerita Beards, Laureen Abrahams 6/5/2023andnbsp10:45 A M Medical Record Number: [id number] Patient Account Number: [id number] Date of Birth/Gender: Treating RN: July 16, 1951 (71 y.o. Debby Bud Primary Care Physician: [name] Other Clinician: Referring Physician: Treating Physician/Extender: Earnestine Leys in Treatment: 6 Education Assessment Education Provided To: Patient Education Topics Provided Wound/Skin Impairment: Handouts: Skin Care Do's and Dont's Methods: Explain/Verbal Responses: Reinforcements needed Electronic Signature(s) Signed: 05/25/2022 3:50:54 PM By: Deon Pilling RN, [name] Entered By: Deon Pilling on 05/25/2022 10:49:09 -------------------------------------------------------------------------------- Wound Assessment Details Patient Name: Date of Service: [name], [place] B. 05/25/2022 10:45 A M Medical Record Number:  [id number] Patient Account Number: [id number] Date of Birth/Sex: Treating RN: Oct 29, 1951 (71 y.o. Helene Shoe, Meta.Reding Primary Care Hazael Olveda: [name] Other Clinician: Referring Kesi Perrow: Treating Breland Elders/Extender: Rosalyn Charters Weeks in Treatment: 6 Wound Status Wound Number: 14 Primary Venous Leg Ulcer Etiology: Wound Location: Left, Posterior Lower Leg Wound Open Wounding Event: Blister Status: Date Acquired: 01/21/2022 Comorbid Glaucoma, Arrhythmia, Hypertension, Peripheral Arterial Disease, Weeks Of Treatment: 6 History: Peripheral Venous Disease, Gout, Osteoarthritis Clustered Wound: No Photos Wound Measurements Length: (cm) 0.9 Width: (cm) 1.5 Depth: (cm) 0.1 Area: (cm) 1.06 Volume: (cm) 0.106 % Reduction in Area: 84.9% % Reduction in Volume: 84.9% Epithelialization: Medium (34-66%) Tunneling: No Undermining: No Wound Description Classification: Full Thickness Without Exposed Support Structures Wound Margin: Flat and Intact Exudate Amount: Medium Exudate Type: Serosanguineous Exudate Color: red, brown Foul Odor After Cleansing: No Slough/Fibrino Yes Wound Bed Granulation Amount: Medium (34-66%) Exposed Structure Granulation Quality: Red, Pink Fascia Exposed: No Necrotic Amount: Medium (34-66%) Fat Layer (Subcutaneous Tissue) Exposed: Yes Necrotic Quality: Adherent Slough Tendon Exposed: No Muscle Exposed: No Joint Exposed: No Bone Exposed: No Treatment Notes Wound #14 (Lower Leg) Wound Laterality: Left, Posterior Cleanser Soap and Water Discharge Instruction: May shower and wash wound with dial antibacterial soap and water prior to dressing change. Wound Cleanser Discharge Instruction: Cleanse the wound with wound cleanser prior to applying a clean dressing using gauze sponges, not tissue or cotton balls. Peri-Wound Care Triamcinolone 15 (g) Discharge Instruction: Use triamcinolone 15 (g) as directed Sween Lotion (Moisturizing  lotion) Discharge Instruction: Apply moisturizing lotion as directed Topical Primary Dressing KerraCel Ag Gelling Fiber Dressing, 2x2 in (silver alginate) Discharge Instruction: Apply silver alginate to wound bed as instructed Secondary Dressing ABD Pad, 8x10 Discharge Instruction: Apply over primary dressing as directed. Woven Gauze Sponge, Non-Sterile 4x4 in Discharge Instruction: Apply over primary dressing as directed. Secured With Compression Wrap Coban Self-Adherent Wrap 4x5 (in/yd) Discharge Instruction: Apply over Kerlix as directed. cotton layer Discharge Instruction: ****USE COTTON LAYER INSTEAD OF KERLIX. Compression Stockings Add-Ons Electronic Signature(s) Signed: 05/25/2022 3:50:54 PM By: Deon Pilling RN, [name] Entered By: Deon Pilling on 05/25/2022 10:48:28 -------------------------------------------------------------------------------- Vitals Details Patient Name: Date of Service: [name], [name] 05/25/2022 10:45 A M Medical Record Number: [id number] Patient Account Number: [id number] Date of Birth/Sex: Treating RN: 1951/06/14 (71 y.o. Helene Shoe, Tammi Klippel Primary Care Finesse Fielder: [name] Other Clinician: Referring Esta Carmon: Treating Teighlor Korson/Extender: Earnestine Leys in Treatment: 6 Vital Signs Time Taken: 10:40 Temperature (F): 98.7 Height (in): 64 Pulse (bpm): 82 Weight (lbs): 137 Respiratory Rate (breaths/min): 16 Body Mass Index (BMI): 23.5 Blood Pressure (mmHg): 128/83 Reference Range: 80 - 120 mg / dl Electronic Signature(s) Signed: 05/25/2022 3:50:54 PM By: Deon Pilling RN, [name] Entered By: Deon Pilling on 05/25/2022 10:44:35
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
## 2022-05-30 ENCOUNTER — Other Ambulatory Visit: Payer: Self-pay | Admitting: Hematology

## 2022-05-31 ENCOUNTER — Encounter: Payer: Self-pay | Admitting: Hematology

## 2022-06-01 ENCOUNTER — Telehealth: Payer: Self-pay | Admitting: Hematology

## 2022-06-01 ENCOUNTER — Encounter (HOSPITAL_BASED_OUTPATIENT_CLINIC_OR_DEPARTMENT_OTHER): Payer: 59 | Admitting: General Surgery

## 2022-06-01 DIAGNOSIS — L97222 Non-pressure chronic ulcer of left calf with fat layer exposed: Secondary | ICD-10-CM | POA: Diagnosis not present
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
## 2022-06-01 NOTE — Progress Notes (Signed)
BERTICE, RISSE ([id number]) Visit Report for 06/01/2022 Arrival Information Details Patient Name: Date of Service: [place], [name] 06/01/2022 10:00 A M Medical Record Number: [id number] Patient Account Number: [id number] Date of Birth/Sex: Treating RN: 1951/07/06 (71 y.o. [name] Primary Care Annia Gomm: [name] Other Clinician: Referring Harleyquinn Gasser: Treating Ardeth Repetto/Extender: Earnestine Leys in Treatment: 7 Visit Information History Since Last Visit Added or deleted any medications: No Patient Arrived: [name] Any new allergies or adverse reactions: No Arrival Time: 09:58 Had a fall or experienced change in No Accompanied By: self activities of daily living that may affect Transfer Assistance: None risk of falls: Patient Requires Transmission-Based Precautions: No Signs or symptoms of abuse/neglect since last visito No Patient Has Alerts: No Hospitalized since last visit: No Implantable device outside of the clinic excluding No cellular tissue based products placed in the center since last visit: Has Dressing in Place as Prescribed: Yes Has Compression in Place as Prescribed: Yes Pain Present Now: No Electronic Signature(s) Signed: 06/01/2022 5:17:06 PM By: Adline Peals Entered By: Adline Peals on 06/01/2022 10:04:28 -------------------------------------------------------------------------------- Encounter Discharge Information Details Patient Name: Date of Service: [name], [name] 06/01/2022 10:00 A M Medical Record Number: [id number] Patient Account Number: [id number] Date of Birth/Sex: Treating RN: [birth year] (71 y.o. [name] Primary Care Tremaine Fuhriman: [name] Other Clinician: Referring Cheyenne Schumm: Treating Anarosa Kubisiak/Extender: Earnestine Leys in Treatment: 7 Encounter Discharge Information Items Post Procedure Vitals Discharge Condition: Stable Temperature (F): 98 Ambulatory  Status: Walker Pulse (bpm): 78 Discharge Destination: Home Respiratory Rate (breaths/min): 16 Transportation: Private Auto Blood Pressure (mmHg): 110/72 Accompanied By: self Schedule Follow-up Appointment: Yes Clinical Summary of Care: Patient Declined Electronic Signature(s) Signed: 06/01/2022 5:17:06 PM By: Adline Peals Entered By: Adline Peals on 06/01/2022 10:39:22 -------------------------------------------------------------------------------- Lower Extremity Assessment Details Patient Name: Date of Service: [name], [name] 06/01/2022 10:00 A M Medical Record Number: [id number] Patient Account Number: [id number] Date of Birth/Sex: Treating RN: 1951/04/11 (71 y.o. [name] Primary Care Patriece Archbold: [name] Other Clinician: Referring Lashaye Fisk: Treating Krystie Leiter/Extender: Rosalyn Charters Weeks in Treatment: 7 Edema Assessment Assessed: [Left: No] [Right: No] Edema: [Left: N] [Right: o] Calf Left: Right: Point of Measurement: 29 cm From Medial Instep 31.4 cm Ankle Left: Right: Point of Measurement: 18 cm From Medial Instep 19.8 cm Vascular Assessment Pulses: Dorsalis Pedis Palpable: [Left:Yes] Electronic Signature(s) Signed: 06/01/2022 5:17:06 PM By: Adline Peals Entered By: Adline Peals on 06/01/2022 10:12:20 -------------------------------------------------------------------------------- Multi Wound Chart Details Patient Name: Date of Service: [name], [name] 06/01/2022 10:00 A M Medical Record Number: [id number] Patient Account Number: [id number] Date of Birth/Sex: Treating RN: [birth year] (71 y.o. [name] Primary Care Kiyana Vazguez: [name] Other Clinician: Referring Britne Borelli: Treating Ronaldo Crilly/Extender: Earnestine Leys in Treatment: 7 Vital Signs Height(in): 64 Pulse(bpm): 18 Weight(lbs): 137 Blood Pressure(mmHg): 110/72 Body Mass Index(BMI):  23.5 Temperature(F): 98 Respiratory Rate(breaths/min): 16 Photos: [N/A:N/A] Left, Posterior Lower Leg N/A N/A Wound Location: Blister N/A N/A Wounding Event: Venous Leg Ulcer N/A N/A Primary Etiology: Glaucoma, Arrhythmia, Hypertension, N/A N/A Comorbid History: Peripheral Arterial Disease, Peripheral Venous Disease, Gout, Osteoarthritis 01/21/2022 N/A N/A Date Acquired: 7 N/A N/A Weeks of Treatment: Open N/A N/A Wound Status: No N/A N/A Wound Recurrence: 0.9x1.2x0.1 N/A N/A Measurements L x W x D (cm) 0.848 N/A N/A A (cm) : rea 0.085 N/A N/A Volume (cm) : 87.90% N/A N/A % Reduction in A rea: 87.90% N/A N/A % Reduction in Volume:  Full Thickness Without Exposed N/A N/A Classification: Support Structures Medium N/A N/A Exudate A mount: Serosanguineous N/A N/A Exudate Type: red, brown N/A N/A Exudate Color: Flat and Intact N/A N/A Wound Margin: Large (67-100%) N/A N/A Granulation A mount: Red, Pink N/A N/A Granulation Quality: Small (1-33%) N/A N/A Necrotic A mount: Fat Layer (Subcutaneous Tissue): Yes N/A N/A Exposed Structures: Fascia: No Tendon: No Muscle: No Joint: No Bone: No Medium (34-66%) N/A N/A Epithelialization: Debridement - Selective/Open Wound N/A N/A Debridement: Pre-procedure Verification/Time Out 10:25 N/A N/A Taken: Other N/A N/A Pain Control: Necrotic/Eschar, Slough N/A N/A Tissue Debrided: Non-Viable Tissue N/A N/A Level: 1.08 N/A N/A Debridement A (sq cm): rea Curette N/A N/A Instrument: Minimum N/A N/A Bleeding: Pressure N/A N/A Hemostasis A chieved: 0 N/A N/A Procedural Pain: 0 N/A N/A Post Procedural Pain: Procedure was tolerated well N/A N/A Debridement Treatment Response: 0.9x1.2x0.1 N/A N/A Post Debridement Measurements L x W x D (cm) 0.085 N/A N/A Post Debridement Volume: (cm) Debridement N/A N/A Procedures Performed: Treatment Notes Electronic Signature(s) Signed: 06/01/2022 10:32:11 AM By:  [name] MD FACS Signed: 06/01/2022 5:17:06 PM By: Adline Peals Entered By: [name] on 06/01/2022 10:32:11 -------------------------------------------------------------------------------- Multi-Disciplinary Care Plan Details Patient Name: Date of Service: [place] [employer], [name] 06/01/2022 10:00 A M Medical Record Number: [id number] Patient Account Number: [id number] Date of Birth/Sex: Treating RN: Sep 23, 1951 (71 y.o. [name] Primary Care Larita Deremer: [name] Other Clinician: Referring Jayshon Dommer: Treating Amayra Kiedrowski/Extender: Earnestine Leys in Treatment: 7 Multidisciplinary Care Plan reviewed with physician Active Inactive Abuse / Safety / Falls / Self Care Management Nursing Diagnoses: Potential for falls Potential for injury related to falls Goals: Patient will not experience any injury related to falls Date Initiated: 04/10/2022 Target Resolution Date: 06/19/2022 Goal Status: Active Patient/caregiver will verbalize/demonstrate measures taken to prevent injury and/or falls Date Initiated: 04/10/2022 Target Resolution Date: 06/19/2022 Goal Status: Active Interventions: Assess Activities of Daily Living upon admission and as needed Assess fall risk on admission and as needed Assess: immobility, friction, shearing, incontinence upon admission and as needed Assess impairment of mobility on admission and as needed per policy Assess personal safety and home safety (as indicated) on admission and as needed Assess self care needs on admission and as needed Provide education on fall prevention Provide education on personal and home safety Notes: Wound/Skin Impairment Nursing Diagnoses: Impaired tissue integrity Knowledge deficit related to ulceration/compromised skin integrity Goals: Patient/caregiver will verbalize understanding of skin care regimen Date Initiated: 04/10/2022 Target Resolution Date: 06/19/2022 Goal Status:  Active Interventions: Assess patient/caregiver ability to obtain necessary supplies Assess patient/caregiver ability to perform ulcer/skin care regimen upon admission and as needed Assess ulceration(s) every visit Provide education on ulcer and skin care Notes: Electronic Signature(s) Signed: 06/01/2022 5:17:06 PM By: Adline Peals Entered By: Adline Peals on 06/01/2022 10:14:24 -------------------------------------------------------------------------------- Pain Assessment Details Patient Name: Date of Service: [name], [name] 06/01/2022 10:00 A M Medical Record Number: [id number] Patient Account Number: [id number] Date of Birth/Sex: Treating RN: 08-05-1951 (71 y.o. [name] Primary Care Nolen Lindamood: [name] Other Clinician: Referring Cort Dragoo: Treating Renlee Floor/Extender: Earnestine Leys in Treatment: 7 Active Problems Location of Pain Severity and Description of Pain Patient Has Paino No Site Locations Rate the pain. Rate the pain. Current Pain Level: 0 Pain Management and Medication Current Pain Management: Electronic Signature(s) Signed: 06/01/2022 5:17:06 PM By: Adline Peals Entered By: Adline Peals on 06/01/2022 10:05:24 -------------------------------------------------------------------------------- Patient/Caregiver Education Details Patient Name: Date of Service: [name]  [name] 6/12/2023andnbsp10:00 A M Medical Record Number: [id number] Patient Account Number: [id number] Date of Birth/Gender: Treating RN: May 23, 1951 (71 y.o. [name] Primary Care Physician: [name] Other Clinician: Referring Physician: Treating Physician/Extender: Earnestine Leys in Treatment: 7 Education Assessment Education Provided To: Patient Education Topics Provided Wound/Skin Impairment: Methods: Explain/Verbal Responses: Reinforcements needed, State content  correctly Electronic Signature(s) Signed: 06/01/2022 5:17:06 PM By: Adline Peals Entered By: Adline Peals on 06/01/2022 10:14:35 -------------------------------------------------------------------------------- Wound Assessment Details Patient Name: Date of Service: [name], [name] 06/01/2022 10:00 A M Medical Record Number: [id number] Patient Account Number: [id number] Date of Birth/Sex: Treating RN: 1951-04-06 (71 y.o. [name] Primary Care Prudy Candy: [name] Other Clinician: Referring Mazella Deen: Treating Jacky Hartung/Extender: Rosalyn Charters Weeks in Treatment: 7 Wound Status Wound Number: 14 Primary Venous Leg Ulcer Etiology: Wound Location: Left, Posterior Lower Leg Wound Open Wounding Event: Blister Status: Date Acquired: 01/21/2022 Comorbid Glaucoma, Arrhythmia, Hypertension, Peripheral Arterial Disease, Weeks Of Treatment: 7 History: Peripheral Venous Disease, Gout, Osteoarthritis Clustered Wound: No Photos Wound Measurements Length: (cm) 0.9 Width: (cm) 1.2 Depth: (cm) 0.1 Area: (cm) 0.848 Volume: (cm) 0.085 % Reduction in Area: 87.9% % Reduction in Volume: 87.9% Epithelialization: Medium (34-66%) Tunneling: No Undermining: No Wound Description Classification: Full Thickness Without Exposed Support Structures Wound Margin: Flat and Intact Exudate Amount: Medium Exudate Type: Serosanguineous Exudate Color: red, brown Foul Odor After Cleansing: No Slough/Fibrino Yes Wound Bed Granulation Amount: Large (67-100%) Exposed Structure Granulation Quality: Red, Pink Fascia Exposed: No Necrotic Amount: Small (1-33%) Fat Layer (Subcutaneous Tissue) Exposed: Yes Necrotic Quality: Adherent Slough Tendon Exposed: No Muscle Exposed: No Joint Exposed: No Bone Exposed: No Treatment Notes Wound #14 (Lower Leg) Wound Laterality: Left, Posterior Cleanser Soap and Water Discharge Instruction: May shower and wash wound with  dial antibacterial soap and water prior to dressing change. Wound Cleanser Discharge Instruction: Cleanse the wound with wound cleanser prior to applying a clean dressing using gauze sponges, not tissue or cotton balls. Peri-Wound Care Triamcinolone 15 (g) Discharge Instruction: Use triamcinolone 15 (g) as directed Sween Lotion (Moisturizing lotion) Discharge Instruction: Apply moisturizing lotion as directed Topical Primary Dressing KerraCel Ag Gelling Fiber Dressing, 2x2 in (silver alginate) Discharge Instruction: Apply silver alginate to wound bed as instructed Secondary Dressing ABD Pad, 8x10 Discharge Instruction: Apply over primary dressing as directed. Woven Gauze Sponge, Non-Sterile 4x4 in Discharge Instruction: Apply over primary dressing as directed. Secured With Compression Wrap Coban Self-Adherent Wrap 4x5 (in/yd) Discharge Instruction: Apply over Kerlix as directed. cotton layer Discharge Instruction: ****USE COTTON LAYER INSTEAD OF KERLIX. Compression Stockings Add-Ons Electronic Signature(s) Signed: 06/01/2022 5:17:06 PM By: Adline Peals Signed: 06/01/2022 5:25:36 PM By: Baruch Gouty RN, [name] Entered By: Baruch Gouty on 06/01/2022 10:13:59 -------------------------------------------------------------------------------- Vitals Details Patient Name: Date of Service: [name], [name] 06/01/2022 10:00 A M Medical Record Number: [id number] Patient Account Number: [id number] Date of Birth/Sex: Treating RN: [birth year] (71 y.o. [name] Primary Care Maxey Ransom: [name] Other Clinician: Referring Nita Whitmire: Treating Antawan Mchugh/Extender: Earnestine Leys in Treatment: 7 Vital Signs Time Taken: 10:04 Temperature (F): 98 Height (in): 64 Pulse (bpm): 78 Weight (lbs): 137 Respiratory Rate (breaths/min): 16 Body Mass Index (BMI): 23.5 Blood Pressure (mmHg): 110/72 Reference Range: 80 - 120 mg / dl Electronic  Signature(s) Signed: 06/01/2022 5:17:06 PM By: Adline Peals Entered By: Adline Peals on 06/01/2022 10:04:45

## 2022-06-01 NOTE — Telephone Encounter (Signed)
.  Called patient to schedule appointment per 6/12 inbaket, patient is aware of date and time.

## 2022-06-02 NOTE — Progress Notes (Signed)
[name], [name] ([id number]) Visit Report for 04/20/2022 Arrival Information Details Patient Name: Date of Service: [name], [name] 04/20/2022 10:45 A M Medical Record Number: [id number] Patient Account Number: [id number] Date of Birth/Sex: Treating RN: 1951-08-16 (71 y.o. F) Primary Care Lilliana Turner: [name] Other Clinician: Referring Blanchard Willhite: Treating Kyndell Zeiser/Extender: Earnestine Leys in Treatment: 1 Visit Information History Since Last Visit Added or deleted any medications: No Patient Arrived: [name] Any new allergies or adverse reactions: No Arrival Time: 10:08 Had a fall or experienced change in No Accompanied By: self activities of daily living that may affect Transfer Assistance: None risk of falls: Patient Requires Transmission-Based Precautions: No Signs or symptoms of abuse/neglect since last visito No Patient Has Alerts: No Hospitalized since last visit: No Implantable device outside of the clinic excluding No cellular tissue based products placed in the center since last visit: Has Dressing in Place as Prescribed: Yes Has Compression in Place as Prescribed: No Pain Present Now: No Electronic Signature(s) Signed: 06/02/2022 8:46:54 AM By: Erenest Blank Entered By: Erenest Blank on 04/20/2022 10:10:38 -------------------------------------------------------------------------------- Encounter Discharge Information Details Patient Name: Date of Service: [name], [name] 04/20/2022 10:45 A M Medical Record Number: [id number] Patient Account Number: [id number] Date of Birth/Sex: Treating RN: 1951-06-09 (71 y.o. Nancy Fetter Primary Care Kayona Foor: [name] Other Clinician: Referring Shadman Tozzi: Treating Kallan Merrick/Extender: Earnestine Leys in Treatment: 1 Encounter Discharge Information Items Post Procedure Vitals Discharge Condition: Stable Temperature (F): 98 Ambulatory Status: [name] Pulse (bpm):  76 Discharge Destination: Home Respiratory Rate (breaths/min): 16 Transportation: Private Auto Blood Pressure (mmHg): 129/74 Accompanied By: alone Schedule Follow-up Appointment: Yes Clinical Summary of Care: Patient Declined Electronic Signature(s) Signed: 04/20/2022 5:50:11 PM By: [name], [name] Entered By: Levan Hurst on 04/20/2022 11:15:25 -------------------------------------------------------------------------------- Lower Extremity Assessment Details Patient Name: Date of Service: [name], [name] 04/20/2022 10:45 A M Medical Record Number: [id number] Patient Account Number: [id number] Date of Birth/Sex: Treating RN: 1951/11/21 (71 y.o. F) Primary Care Kaston Faughn: [name] Other Clinician: Referring Jayonna Meyering: Treating Jayme Cham/Extender: Rosalyn Charters Weeks in Treatment: 1 Edema Assessment Assessed: [Left: No] [Right: No] Edema: [Left: Ye] [Right: s] Calf Left: Right: Point of Measurement: 29 cm From Medial Instep 32.1 cm Ankle Left: Right: Point of Measurement: 18 cm From Medial Instep 18.9 cm Vascular Assessment Pulses: Dorsalis Pedis Palpable: [Left:Yes] Electronic Signature(s) Signed: 06/02/2022 8:46:54 AM By: Erenest Blank Entered By: Erenest Blank on 04/20/2022 10:24:12 -------------------------------------------------------------------------------- Multi Wound Chart Details Patient Name: Date of Service: [name], [name] 04/20/2022 10:45 A M Medical Record Number: [id number] Patient Account Number: [id number] Date of Birth/Sex: Treating RN: [birth year] (71 y.o. F) Primary Care Yaeli Hartung: [name] Other Clinician: Referring Jacquez Sheetz: Treating Davari Lopes/Extender: Earnestine Leys in Treatment: 1 Vital Signs Height(in): 64 Pulse(bpm): 84 Weight(lbs): 137 Blood Pressure(mmHg): 129/74 Body Mass Index(BMI): 23.5 Temperature(F): 98 Respiratory Rate(breaths/min): 16 Photos: Left, Anterior Lower  Leg Left, Posterior Lower Leg Right Elbow Wound Location: Blister Blister Bump Wounding Event: Venous Leg Ulcer Venous Leg Ulcer Infection - not elsewhere classified Primary Etiology: Glaucoma, Arrhythmia, Hypertension, Glaucoma, Arrhythmia, Hypertension, Glaucoma, Arrhythmia, Hypertension, Comorbid History: Peripheral Arterial Disease, Peripheral Peripheral Arterial Disease, Peripheral Peripheral Arterial Disease, Peripheral Venous Disease, Gout, Osteoarthritis Venous Disease, Gout, Osteoarthritis Venous Disease, Gout, Osteoarthritis 01/21/2022 01/21/2022 04/08/2022 Date Acquired: '1 1 1 '$ Weeks of Treatment: Open Open Open Wound Status: No No No Wound Recurrence: 1.3x0.6x0.1 2.9x2.3x0.1 0.4x0.3x0.7 Measurements L x W x D (cm) 0.613 5.239 0.094  A (cm) : rea 0.061 0.524 0.066 Volume (cm) : 51.20% 25.60% 76.10% % Reduction in A rea: 51.60% 25.60% 88.80% % Reduction in Volume: 9 Starting Position 1 (o'clock): 1 Ending Position 1 (o'clock): 0.3 Maximum Distance 1 (cm): No No Yes Undermining: Full Thickness Without Exposed Full Thickness Without Exposed Full Thickness Without Exposed Classification: Support Structures Support Structures Support Structures Medium Medium Medium Exudate A mount: Serosanguineous Serosanguineous Serosanguineous Exudate Type: red, brown red, brown red, brown Exudate Color: Flat and Intact Flat and Intact Well defined, not attached Wound Margin: Small (1-33%) Small (1-33%) Large (67-100%) Granulation A mount: Pink, Pale Red, Pink Red Granulation Quality: Large (67-100%) Large (67-100%) None Present (0%) Necrotic A mount: Fat Layer (Subcutaneous Tissue): Yes Fat Layer (Subcutaneous Tissue): Yes Fat Layer (Subcutaneous Tissue): Yes Exposed Structures: Fascia: No Fascia: No Fascia: No Tendon: No Tendon: No Tendon: No Muscle: No Muscle: No Muscle: No Joint: No Joint: No Joint: No Bone: No Bone: No Bone: No Small (1-33%) Small  (1-33%) Medium (34-66%) Epithelialization: Debridement - Selective/Open Wound Debridement - Selective/Open Wound N/A Debridement: Pre-procedure Verification/Time Out 10:46 10:46 N/A Taken: Necrotic/Eschar, [facility] N/A Tissue Debrided: Non-Viable Tissue Non-Viable Tissue N/A Level: 0.78 6.67 N/A Debridement A (sq cm): rea Curette Curette N/A Instrument: Minimum Minimum N/A Bleeding: Pressure Pressure N/A Hemostasis A chieved: 0 0 N/A Procedural Pain: 0 0 N/A Post Procedural Pain: Procedure was tolerated well Procedure was tolerated well N/A Debridement Treatment Response: 1.3x0.6x0.1 2.9x2.3x0.1 N/A Post Debridement Measurements L x W x D (cm) 0.061 0.524 N/A Post Debridement Volume: (cm) Debridement Debridement N/A Procedures Performed: Treatment Notes Wound #13 (Lower Leg) Wound Laterality: Left, Anterior Cleanser Soap and Water Discharge Instruction: May shower and wash wound with dial antibacterial soap and water prior to dressing change. Wound Cleanser Discharge Instruction: Cleanse the wound with wound cleanser prior to applying a clean dressing using gauze sponges, not tissue or cotton balls. Peri-Wound Care Sween Lotion (Moisturizing lotion) Discharge Instruction: Apply moisturizing lotion as directed Topical Primary Dressing KerraCel Ag Gelling Fiber Dressing, 2x2 in (silver alginate) Discharge Instruction: Apply silver alginate to wound bed as instructed Secondary Dressing ABD Pad, 8x10 Discharge Instruction: Apply over primary dressing as directed. Woven Gauze Sponge, Non-Sterile 4x4 in Discharge Instruction: Apply over primary dressing as directed. Secured With Compression Wrap Kerlix Roll 4.5x3.1 (in/yd) Discharge Instruction: Apply Kerlix and Coban compression as directed. Coban Self-Adherent Wrap 4x5 (in/yd) Discharge Instruction: Apply over Kerlix as directed. Compression Stockings Add-Ons Wound #14 (Lower Leg) Wound Laterality: Left,  Posterior Cleanser Soap and Water Discharge Instruction: May shower and wash wound with dial antibacterial soap and water prior to dressing change. Wound Cleanser Discharge Instruction: Cleanse the wound with wound cleanser prior to applying a clean dressing using gauze sponges, not tissue or cotton balls. Peri-Wound Care Sween Lotion (Moisturizing lotion) Discharge Instruction: Apply moisturizing lotion as directed Topical Primary Dressing IODOFLEX 0.9% Cadexomer Iodine Pad 4x6 cm Discharge Instruction: Apply to wound bed as instructed Secondary Dressing ABD Pad, 8x10 Discharge Instruction: Apply over primary dressing as directed. Woven Gauze Sponge, Non-Sterile 4x4 in Discharge Instruction: Apply over primary dressing as directed. Secured With Compression Wrap Kerlix Roll 4.5x3.1 (in/yd) Discharge Instruction: Apply Kerlix and Coban compression as directed. Coban Self-Adherent Wrap 4x5 (in/yd) Discharge Instruction: Apply over Kerlix as directed. Compression Stockings Add-Ons Wound #15 (Elbow) Wound Laterality: Right Cleanser Soap and Water Discharge Instruction: May shower and wash wound with dial antibacterial soap and water prior to dressing change. Peri-Wound Care Topical Primary Dressing Dry gauze  Secondary Dressing Secured With Conforming Stretch Gauze Bandage, Sterile 2x75 (in/in) Discharge Instruction: Secure with stretch gauze as directed. 67M Medipore Soft Cloth Surgical T 2x10 (in/yd) ape Discharge Instruction: Secure with tape as directed. Compression Wrap Compression Stockings Add-Ons Electronic Signature(s) Signed: 04/20/2022 11:17:37 AM By: [name] MD FACS Entered By: [name] on 04/20/2022 11:17:37 -------------------------------------------------------------------------------- Multi-Disciplinary Care Plan Details Patient Name: Date of Service: [place], [name] 04/20/2022 10:45 A M Medical Record Number: [id number] Patient Account  Number: [id number] Date of Birth/Sex: Treating RN: May 27, 1951 (71 y.o. Nancy Fetter Primary Care Davidlee Jeanbaptiste: [name] Other Clinician: Referring Lota Leamer: Treating Audi Conover/Extender: Earnestine Leys in Treatment: 1 Multidisciplinary Care Plan reviewed with physician Active Inactive Abuse / Safety / Falls / Self Care Management Nursing Diagnoses: Potential for falls Potential for injury related to falls Goals: Patient will not experience any injury related to falls Date Initiated: 04/10/2022 Target Resolution Date: 05/08/2022 Goal Status: Active Patient/caregiver will verbalize/demonstrate measures taken to prevent injury and/or falls Date Initiated: 04/10/2022 Target Resolution Date: 05/08/2022 Goal Status: Active Interventions: Assess Activities of Daily Living upon admission and as needed Assess fall risk on admission and as needed Assess: immobility, friction, shearing, incontinence upon admission and as needed Assess impairment of mobility on admission and as needed per policy Assess personal safety and home safety (as indicated) on admission and as needed Assess self care needs on admission and as needed Provide education on fall prevention Provide education on personal and home safety Notes: Wound/Skin Impairment Nursing Diagnoses: Impaired tissue integrity Knowledge deficit related to ulceration/compromised skin integrity Goals: Patient/caregiver will verbalize understanding of skin care regimen Date Initiated: 04/10/2022 Target Resolution Date: 05/08/2022 Goal Status: Active Interventions: Assess patient/caregiver ability to obtain necessary supplies Assess patient/caregiver ability to perform ulcer/skin care regimen upon admission and as needed Assess ulceration(s) every visit Provide education on ulcer and skin care Notes: Electronic Signature(s) Signed: 04/20/2022 5:50:11 PM By: [name], [name] Entered By: Levan Hurst on  04/20/2022 10:41:59 -------------------------------------------------------------------------------- Pain Assessment Details Patient Name: Date of Service: [name], [place] Bruins B. 04/20/2022 10:45 A M Medical Record Number: [id number] Patient Account Number: [id number] Date of Birth/Sex: Treating RN: 1951-08-28 (71 y.o. F) Primary Care Iyania Denne: [name] Other Clinician: Referring Chayton Murata: Treating Ceonna Frazzini/Extender: Earnestine Leys in Treatment: 1 Active Problems Location of Pain Severity and Description of Pain Patient Has Paino No Site Locations Pain Management and Medication Current Pain Management: Electronic Signature(s) Signed: 06/02/2022 8:46:54 AM By: Erenest Blank Entered By: Erenest Blank on 04/20/2022 10:11:16 -------------------------------------------------------------------------------- Patient/Caregiver Education Details Patient Name: Date of Service: [place]uerita [place], [name] 5/1/2023andnbsp10:45 A M Medical Record Number: [id number] Patient Account Number: [id number] Date of Birth/Gender: Treating RN: March 16, 1951 (71 y.o. Nancy Fetter Primary Care Physician: [name] Other Clinician: Referring Physician: Treating Physician/Extender: Earnestine Leys in Treatment: 1 Education Assessment Education Provided To: Patient Education Topics Provided Wound/Skin Impairment: Methods: Explain/Verbal Responses: State content correctly Electronic Signature(s) Signed: 04/20/2022 5:50:11 PM By: [name], [name] Entered By: Levan Hurst on 04/20/2022 10:42:11 -------------------------------------------------------------------------------- Wound Assessment Details Patient Name: Date of Service: [name], [place] Bruins B. 04/20/2022 10:45 A M Medical Record Number: [id number] Patient Account Number: [id number] Date of Birth/Sex: Treating RN: [birth year] (71 y.o. F) Primary Care Charlesetta Milliron: [name] Other  Clinician: Referring Perseus Westall: Treating Kameren Baade/Extender: Rosalyn Charters Weeks in Treatment: 1 Wound Status Wound Number: 13 Primary Venous Leg Ulcer Etiology: Wound Location: Left, Anterior Lower Leg Wound Open  Wounding Event: Blister Status: Date Acquired: 01/21/2022 Comorbid Glaucoma, Arrhythmia, Hypertension, Peripheral Arterial Disease, Weeks Of Treatment: 1 History: Peripheral Venous Disease, Gout, Osteoarthritis Clustered Wound: No Photos Wound Measurements Length: (cm) 1.3 Width: (cm) 0.6 Depth: (cm) 0.1 Area: (cm) 0.613 Volume: (cm) 0.061 % Reduction in Area: 51.2% % Reduction in Volume: 51.6% Epithelialization: Small (1-33%) Tunneling: No Undermining: No Wound Description Classification: Full Thickness Without Exposed Support Structures Wound Margin: Flat and Intact Exudate Amount: Medium Exudate Type: Serosanguineous Exudate Color: red, brown Foul Odor After Cleansing: No Slough/Fibrino Yes Wound Bed Granulation Amount: Small (1-33%) Exposed Structure Granulation Quality: Pink, Pale Fascia Exposed: No Necrotic Amount: Large (67-100%) Fat Layer (Subcutaneous Tissue) Exposed: Yes Necrotic Quality: Adherent Slough Tendon Exposed: No Muscle Exposed: No Joint Exposed: No Bone Exposed: No Electronic Signature(s) Signed: 06/02/2022 8:46:54 AM By: Erenest Blank Entered By: Erenest Blank on 04/20/2022 10:35:45 -------------------------------------------------------------------------------- Wound Assessment Details Patient Name: Date of Service: [name], Moody Bruins B. 04/20/2022 10:45 A M Medical Record Number: [id number] Patient Account Number: [id number] Date of Birth/Sex: Treating RN: 1950-12-22 (71 y.o. F) Primary Care Hughey Rittenberry: [name] Other Clinician: Referring Manjot Hinks: Treating Raneen Jaffer/Extender: Earnestine Leys in Treatment: 1 Wound Status Wound Number: 14 Primary Venous Leg Ulcer Etiology: Wound  Location: Left, Posterior Lower Leg Wound Open Wounding Event: Blister Status: Date Acquired: 01/21/2022 Comorbid Glaucoma, Arrhythmia, Hypertension, Peripheral Arterial Disease, Weeks Of Treatment: 1 History: Peripheral Venous Disease, Gout, Osteoarthritis Clustered Wound: No Photos Wound Measurements Length: (cm) 2.9 Width: (cm) 2.3 Depth: (cm) 0.1 Area: (cm) 5.239 Volume: (cm) 0.524 % Reduction in Area: 25.6% % Reduction in Volume: 25.6% Epithelialization: Small (1-33%) Tunneling: No Undermining: No Wound Description Classification: Full Thickness Without Exposed Support Structures Wound Margin: Flat and Intact Exudate Amount: Medium Exudate Type: Serosanguineous Exudate Color: red, brown Foul Odor After Cleansing: No Slough/Fibrino Yes Wound Bed Granulation Amount: Small (1-33%) Exposed Structure Granulation Quality: Red, Pink Fascia Exposed: No Necrotic Amount: Large (67-100%) Fat Layer (Subcutaneous Tissue) Exposed: Yes Necrotic Quality: Adherent Slough Tendon Exposed: No Muscle Exposed: No Joint Exposed: No Bone Exposed: No Electronic Signature(s) Signed: 06/02/2022 8:46:54 AM By: Erenest Blank Entered By: Erenest Blank on 04/20/2022 10:35:14 -------------------------------------------------------------------------------- Wound Assessment Details Patient Name: Date of Service: [name], Moody Bruins B. 04/20/2022 10:45 A M Medical Record Number: [id number] Patient Account Number: [id number] Date of Birth/Sex: Treating RN: 1951/08/30 (71 y.o. F) Primary Care Zubayr Bednarczyk: [name] Other Clinician: Referring Anhelica Fowers: Treating Kameela Leipold/Extender: Earnestine Leys in Treatment: 1 Wound Status Wound Number: 15 Primary Infection - not elsewhere classified Etiology: Wound Location: Right Elbow Wound Open Wounding Event: Bump Status: Date Acquired: 04/08/2022 Comorbid Glaucoma, Arrhythmia, Hypertension, Peripheral Arterial Disease, Weeks Of  Treatment: 1 History: Peripheral Venous Disease, Gout, Osteoarthritis Clustered Wound: No Photos Wound Measurements Length: (cm) 0.4 Width: (cm) 0.3 Depth: (cm) 0.7 Area: (cm) 0.094 Volume: (cm) 0.066 % Reduction in Area: 76.1% % Reduction in Volume: 88.8% Epithelialization: Medium (34-66%) Tunneling: No Undermining: Yes Starting Position (o'clock): 9 Ending Position (o'clock): 1 Maximum Distance: (cm) 0.3 Wound Description Classification: Full Thickness Without Exposed Support Structures Wound Margin: Well defined, not attached Exudate Amount: Medium Exudate Type: Serosanguineous Exudate Color: red, brown Foul Odor After Cleansing: No Slough/Fibrino No Wound Bed Granulation Amount: Large (67-100%) Exposed Structure Granulation Quality: Red Fascia Exposed: No Necrotic Amount: None Present (0%) Fat Layer (Subcutaneous Tissue) Exposed: Yes Tendon Exposed: No Muscle Exposed: No Joint Exposed: No Bone Exposed: No Electronic Signature(s) Signed: 06/02/2022 8:46:54 AM By: Erenest Blank Entered By:  Erenest Blank on 04/20/2022 10:34:40 -------------------------------------------------------------------------------- Vitals Details Patient Name: Date of Service: [place], [name] 04/20/2022 10:45 A M Medical Record Number: [id number] Patient Account Number: [id number] Date of Birth/Sex: Treating RN: Oct 25, 1951 (70 y.o. F) Primary Care Rosalee Tolley: [name] Other Clinician: Referring Brylea Pita: Treating Torion Hulgan/Extender: Earnestine Leys in Treatment: 1 Vital Signs Time Taken: 10:09 Temperature (F): 98 Height (in): 64 Pulse (bpm): 76 Weight (lbs): 137 Respiratory Rate (breaths/min): 16 Body Mass Index (BMI): 23.5 Blood Pressure (mmHg): 129/74 Reference Range: 80 - 120 mg / dl Electronic Signature(s) Signed: 06/02/2022 8:46:54 AM By: Erenest Blank Entered By: Erenest Blank on 04/20/2022 10:11:07

## 2022-06-08 ENCOUNTER — Other Ambulatory Visit: Payer: Self-pay | Admitting: Nurse Practitioner

## 2022-06-09 ENCOUNTER — Encounter (HOSPITAL_BASED_OUTPATIENT_CLINIC_OR_DEPARTMENT_OTHER): Payer: 59 | Admitting: General Surgery

## 2022-06-09 DIAGNOSIS — L97222 Non-pressure chronic ulcer of left calf with fat layer exposed: Secondary | ICD-10-CM | POA: Diagnosis not present

## 2022-06-09 NOTE — Progress Notes (Signed)
[name], [name] ([id number]) Visit Report for 06/09/2022 Arrival Information Details Patient Name: Date of Service: [name], [name] 06/09/2022 10:45 A M Medical Record Number: [id number] Patient Account Number: [id number] Date of Birth/Sex: Treating RN: [birth year] (71 y.o. [name], [name] Primary Care Slyvester Latona: [name] Other Clinician: Referring Marceline Napierala: Treating Micheil Klaus/Extender: Earnestine Leys in Treatment: 8 Visit Information History Since Last Visit Added or deleted any medications: No Patient Arrived: Walker Any new allergies or adverse reactions: No Arrival Time: 10:41 Had a fall or experienced change in No Accompanied By: self activities of daily living that may affect Transfer Assistance: None risk of falls: Patient Identification Verified: Yes Signs or symptoms of abuse/neglect since last visito No Secondary Verification Process Completed: Yes Hospitalized since last visit: No Patient Requires Transmission-Based Precautions: No Implantable device outside of the clinic excluding No Patient Has Alerts: No cellular tissue based products placed in the center since last visit: Has Dressing in Place as Prescribed: Yes Has Compression in Place as Prescribed: Yes Pain Present Now: No Electronic Signature(s) Signed: 06/09/2022 4:52:42 PM By: Adline Peals Entered By: Adline Peals on 06/09/2022 10:44:43 -------------------------------------------------------------------------------- Encounter Discharge Information Details Patient Name: Date of Service: [name], Stacey B. 06/09/2022 10:45 A M Medical Record Number: [id number] Patient Account Number: [id number] Date of Birth/Sex: Treating RN: [birth year] (71 y.o. [name] Primary Care Luvern Mcisaac: [name] Other Clinician: Referring Jerlyn Pain: Treating Janiya Millirons/Extender: Earnestine Leys in Treatment: 8 Encounter Discharge Information Items  Post Procedure Vitals Discharge Condition: Stable Temperature (F): 98.5 Ambulatory Status: Walker Pulse (bpm): 75 Discharge Destination: Home Respiratory Rate (breaths/min): 18 Transportation: Private Auto Blood Pressure (mmHg): 87/57 Accompanied By: self Schedule Follow-up Appointment: Yes Clinical Summary of Care: Patient Declined Electronic Signature(s) Signed: 06/09/2022 4:52:42 PM By: Adline Peals Entered By: Adline Peals on 06/09/2022 11:43:24 -------------------------------------------------------------------------------- Lower Extremity Assessment Details Patient Name: Date of Service: [name], Stacey B. 06/09/2022 10:45 A M Medical Record Number: [id number] Patient Account Number: [id number] Date of Birth/Sex: Treating RN: 12-21-1951 (72 y.o. [name] Primary Care Shequita Peplinski: [name] Other Clinician: Referring Marianela Mandrell: Treating Shereka Lafortune/Extender: Earnestine Leys in Treatment: 8 Edema Assessment Assessed: [Left: No] [Right: No] Edema: [Left: N] [Right: o] Calf Left: Right: Point of Measurement: 29 cm From Medial Instep 30.3 cm Ankle Left: Right: Point of Measurement: 18 cm From Medial Instep 18.5 cm Vascular Assessment Pulses: Dorsalis Pedis Palpable: [Left:Yes] Electronic Signature(s) Signed: 06/09/2022 4:52:42 PM By: Adline Peals Entered By: Adline Peals on 06/09/2022 10:48:29 -------------------------------------------------------------------------------- Multi Wound Chart Details Patient Name: Date of Service: [name], Stacey B. 06/09/2022 10:45 A M Medical Record Number: [id number] Patient Account Number: [id number] Date of Birth/Sex: Treating RN: 1951-01-05 (71 y.o. [name] Primary Care Trevonn Hallum: [name] Other Clinician: Referring Caedmon Louque: Treating Hadden Steig/Extender: Earnestine Leys in Treatment: 8 Vital Signs Height(in): 64 Pulse(bpm):  75 Weight(lbs): 137 Blood Pressure(mmHg): 87/57 Body Mass Index(BMI): 23.5 Temperature(F): 98.5 Respiratory Rate(breaths/min): 18 Photos: [N/A:N/A] Left, Posterior Lower Leg N/A N/A Wound Location: Blister N/A N/A Wounding Event: Venous Leg Ulcer N/A N/A Primary Etiology: Glaucoma, Arrhythmia, Hypertension, N/A N/A Comorbid History: Peripheral Arterial Disease, Peripheral Venous Disease, Gout, Osteoarthritis 01/21/2022 N/A N/A Date Acquired: 8 N/A N/A Weeks of Treatment: Open N/A N/A Wound Status: No N/A N/A Wound Recurrence: 0.7x0.9x0.1 N/A N/A Measurements L x W x D (cm) 0.495 N/A N/A A (cm) : rea 0.049 N/A N/A Volume (cm) : 93.00% N/A N/A % Reduction in  A rea: 93.00% N/A N/A % Reduction in Volume: Full Thickness Without Exposed N/A N/A Classification: Support Structures Medium N/A N/A Exudate A mount: Serosanguineous N/A N/A Exudate Type: red, brown N/A N/A Exudate Color: Flat and Intact N/A N/A Wound Margin: Small (1-33%) N/A N/A Granulation A mount: Red, Pink N/A N/A Granulation Quality: Large (67-100%) N/A N/A Necrotic A mount: Fat Layer (Subcutaneous Tissue): Yes N/A N/A Exposed Structures: Fascia: No Tendon: No Muscle: No Joint: No Bone: No Medium (34-66%) N/A N/A Epithelialization: Debridement - Selective/Open Wound N/A N/A Debridement: Pre-procedure Verification/Time Out 10:55 N/A N/A Taken: Other N/A N/A Pain Control: Necrotic/Eschar N/A N/A Tissue Debrided: Non-Viable Tissue N/A N/A Level: 0.63 N/A N/A Debridement A (sq cm): rea Curette N/A N/A Instrument: Minimum N/A N/A Bleeding: Pressure N/A N/A Hemostasis A chieved: 0 N/A N/A Procedural Pain: 0 N/A N/A Post Procedural Pain: Procedure was tolerated well N/A N/A Debridement Treatment Response: 0.7x0.9x0.1 N/A N/A Post Debridement Measurements L x W x D (cm) 0.049 N/A N/A Post Debridement Volume: (cm) Debridement N/A N/A Procedures Performed: Treatment  Notes Electronic Signature(s) Signed: 06/09/2022 10:59:14 AM By: [name] MD FACS Signed: 06/09/2022 4:52:42 PM By: Adline Peals Entered By: [name] on 06/09/2022 10:59:14 -------------------------------------------------------------------------------- Multi-Disciplinary Care Plan Details Patient Name: Date of Service: Stacey Jasper, Stacey B. 06/09/2022 10:45 A M Medical Record Number: [id number] Patient Account Number: [id number] Date of Birth/Sex: Treating RN: [birth year] (71 y.o. [name] Primary Care Leisa Gault: [name] Other Clinician: Referring Kao Berkheimer: Treating Nickol Collister/Extender: Earnestine Leys in Treatment: 8 Multidisciplinary Care Plan reviewed with physician Active Inactive Abuse / Safety / Falls / Self Care Management Nursing Diagnoses: Potential for falls Potential for injury related to falls Goals: Patient will not experience any injury related to falls Date Initiated: 04/10/2022 Target Resolution Date: 06/19/2022 Goal Status: Active Patient/caregiver will verbalize/demonstrate measures taken to prevent injury and/or falls Date Initiated: 04/10/2022 Target Resolution Date: 06/19/2022 Goal Status: Active Interventions: Assess Activities of Daily Living upon admission and as needed Assess fall risk on admission and as needed Assess: immobility, friction, shearing, incontinence upon admission and as needed Assess impairment of mobility on admission and as needed per policy Assess personal safety and home safety (as indicated) on admission and as needed Assess self care needs on admission and as needed Provide education on fall prevention Provide education on personal and home safety Notes: Wound/Skin Impairment Nursing Diagnoses: Impaired tissue integrity Knowledge deficit related to ulceration/compromised skin integrity Goals: Patient/caregiver will verbalize understanding of skin care regimen Date  Initiated: 04/10/2022 Target Resolution Date: 06/19/2022 Goal Status: Active Interventions: Assess patient/caregiver ability to obtain necessary supplies Assess patient/caregiver ability to perform ulcer/skin care regimen upon admission and as needed Assess ulceration(s) every visit Provide education on ulcer and skin care Notes: Electronic Signature(s) Signed: 06/09/2022 4:52:42 PM By: Adline Peals Entered By: Adline Peals on 06/09/2022 10:53:03 -------------------------------------------------------------------------------- Pain Assessment Details Patient Name: Date of Service: [name], Stacey B. 06/09/2022 10:45 A M Medical Record Number: [id number] Patient Account Number: [id number] Date of Birth/Sex: Treating RN: [birth year] (71 y.o. [name] Primary Care Xion Debruyne: [name] Other Clinician: Referring Liddy Deam: Treating Jasmond River/Extender: Earnestine Leys in Treatment: 8 Active Problems Location of Pain Severity and Description of Pain Patient Has Paino No Site Locations Rate the pain. Rate the pain. Current Pain Level: 0 Pain Management and Medication Current Pain Management: Electronic Signature(s) Signed: 06/09/2022 4:52:42 PM By: Adline Peals Entered By: Adline Peals on 06/09/2022 10:45:10 -------------------------------------------------------------------------------- Patient/Caregiver Education  Details Patient Name: Date of Service: [place], [name] 6/20/2023andnbsp10:45 A M Medical Record Number: [id number] Patient Account Number: [id number] Date of Birth/Gender: Treating RN: [birth year] (71 y.o. [name] Primary Care Physician: [name] Other Clinician: Referring Physician: Treating Physician/Extender: Earnestine Leys in Treatment: 8 Education Assessment Education Provided To: Patient Education Topics Provided Wound/Skin Impairment: Methods:  Explain/Verbal Responses: Reinforcements needed, State content correctly Electronic Signature(s) Signed: 06/09/2022 4:52:42 PM By: Adline Peals Entered By: Adline Peals on 06/09/2022 10:53:15 -------------------------------------------------------------------------------- Wound Assessment Details Patient Name: Date of Service: [name], Stacey B. 06/09/2022 10:45 A M Medical Record Number: [id number] Patient Account Number: [id number] Date of Birth/Sex: Treating RN: Nov 24, 1951 (71 y.o. [name] Primary Care Brack Shaddock: [name] Other Clinician: Referring Yazmyne Sara: Treating Ethal Gotay/Extender: Rosalyn Charters Weeks in Treatment: 8 Wound Status Wound Number: 14 Primary Venous Leg Ulcer Etiology: Wound Location: Left, Posterior Lower Leg Wound Open Wounding Event: Blister Status: Date Acquired: 01/21/2022 Comorbid Glaucoma, Arrhythmia, Hypertension, Peripheral Arterial Disease, Weeks Of Treatment: 8 History: Peripheral Venous Disease, Gout, Osteoarthritis Clustered Wound: No Photos Wound Measurements Length: (cm) 0.7 Width: (cm) 0.9 Depth: (cm) 0.1 Area: (cm) 0.495 Volume: (cm) 0.049 % Reduction in Area: 93% % Reduction in Volume: 93% Epithelialization: Medium (34-66%) Tunneling: No Undermining: No Wound Description Classification: Full Thickness Without Exposed Support Structures Wound Margin: Flat and Intact Exudate Amount: Medium Exudate Type: Serosanguineous Exudate Color: red, brown Foul Odor After Cleansing: No Slough/Fibrino Yes Wound Bed Granulation Amount: Small (1-33%) Exposed Structure Granulation Quality: Red, Pink Fascia Exposed: No Necrotic Amount: Large (67-100%) Fat Layer (Subcutaneous Tissue) Exposed: Yes Necrotic Quality: Adherent Slough Tendon Exposed: No Muscle Exposed: No Joint Exposed: No Bone Exposed: No Treatment Notes Wound #14 (Lower Leg) Wound Laterality: Left, Posterior Cleanser Soap and  Water Discharge Instruction: May shower and wash wound with dial antibacterial soap and water prior to dressing change. Wound Cleanser Discharge Instruction: Cleanse the wound with wound cleanser prior to applying a clean dressing using gauze sponges, not tissue or cotton balls. Peri-Wound Care Triamcinolone 15 (g) Discharge Instruction: Use triamcinolone 15 (g) as directed Sween Lotion (Moisturizing lotion) Discharge Instruction: Apply moisturizing lotion as directed Topical Primary Dressing KerraCel Ag Gelling Fiber Dressing, 2x2 in (silver alginate) Discharge Instruction: Apply silver alginate to wound bed as instructed Secondary Dressing ABD Pad, 8x10 Discharge Instruction: Apply over primary dressing as directed. Woven Gauze Sponge, Non-Sterile 4x4 in Discharge Instruction: Apply over primary dressing as directed. Secured With Compression Wrap Coban Self-Adherent Wrap 4x5 (in/yd) Discharge Instruction: Apply over Kerlix as directed. cotton layer Discharge Instruction: ****USE COTTON LAYER INSTEAD OF KERLIX. Compression Stockings Add-Ons Electronic Signature(s) Signed: 06/09/2022 4:52:42 PM By: Adline Peals Entered By: Adline Peals on 06/09/2022 10:52:30 -------------------------------------------------------------------------------- Vitals Details Patient Name: Date of Service: Stacey Stacey Decamp, [name] 06/09/2022 10:45 A M Medical Record Number: [id number] Patient Account Number: [id number] Date of Birth/Sex: Treating RN: [birth year] (71 y.o. [name] Primary Care Liliyana Thobe: [name] Other Clinician: Referring Fenix Rorke: Treating Rhya Shan/Extender: Earnestine Leys in Treatment: 8 Vital Signs Time Taken: 10:44 Temperature (F): 98.5 Height (in): 64 Pulse (bpm): 75 Weight (lbs): 137 Respiratory Rate (breaths/min): 18 Body Mass Index (BMI): 23.5 Blood Pressure (mmHg): 87/57 Reference Range: 80 - 120 mg / dl Electronic  Signature(s) Signed: 06/09/2022 4:52:42 PM By: Adline Peals Entered By: Adline Peals on 06/09/2022 10:45:02
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
## 2022-06-16 ENCOUNTER — Encounter (HOSPITAL_BASED_OUTPATIENT_CLINIC_OR_DEPARTMENT_OTHER): Payer: 59 | Admitting: General Surgery

## 2022-06-16 DIAGNOSIS — L97222 Non-pressure chronic ulcer of left calf with fat layer exposed: Secondary | ICD-10-CM | POA: Diagnosis not present

## 2022-06-22 ENCOUNTER — Other Ambulatory Visit: Payer: Self-pay

## 2022-06-24 ENCOUNTER — Encounter (HOSPITAL_BASED_OUTPATIENT_CLINIC_OR_DEPARTMENT_OTHER): Payer: 59 | Attending: General Surgery | Admitting: General Surgery

## 2022-06-24 DIAGNOSIS — Z89511 Acquired absence of right leg below knee: Secondary | ICD-10-CM | POA: Insufficient documentation

## 2022-06-24 DIAGNOSIS — Z89611 Acquired absence of right leg above knee: Secondary | ICD-10-CM | POA: Diagnosis not present

## 2022-06-24 DIAGNOSIS — I872 Venous insufficiency (chronic) (peripheral): Secondary | ICD-10-CM | POA: Diagnosis not present

## 2022-06-24 DIAGNOSIS — I739 Peripheral vascular disease, unspecified: Secondary | ICD-10-CM | POA: Insufficient documentation

## 2022-06-24 DIAGNOSIS — L97222 Non-pressure chronic ulcer of left calf with fat layer exposed: Secondary | ICD-10-CM | POA: Insufficient documentation

## 2022-06-24 DIAGNOSIS — I890 Lymphedema, not elsewhere classified: Secondary | ICD-10-CM | POA: Insufficient documentation

## 2022-06-24 DIAGNOSIS — S8012XA Contusion of left lower leg, initial encounter: Secondary | ICD-10-CM | POA: Insufficient documentation

## 2022-06-24 DIAGNOSIS — D45 Polycythemia vera: Secondary | ICD-10-CM | POA: Diagnosis not present

## 2022-06-24 NOTE — Progress Notes (Signed)
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 [name] 06/24/2022 Medical Record Number: [id number] Patient Account Number: [id number] Date of Birth/Sex: Treating RN: [birth year] (71 y.o. [name] Primary Care Provider: [name] Other Clinician: Referring Provider: Treating Provider/Extender: Earnestine Leys in Treatment: 10 Diagnosis Coding ICD-10 Codes Code Description [phone] Acquired absence of right leg above knee I73.9 Peripheral vascular disease, unspecified I25.10 Atherosclerotic heart disease of native coronary artery without angina pectoris I10 Essential (primary) hypertension L97.222 Non-pressure chronic ulcer of left calf with fat layer exposed Facility Procedures Physician Procedures : CPT4 Code Description Modifier [id number] - WC PHYS LEVEL 2 - EST PT ICD-10 Diagnosis Description L97.222 Non-pressure chronic ulcer  of left calf with fat layer exposed I73.9 Peripheral vascular disease, unspecified I25.10 Atherosclerotic heart  disease of native coronary artery without angina pectoris Z89.611 Acquired absence of right leg above knee Quantity: 1 Electronic Signature(s) Signed: 06/24/2022 12:22:20 PM By: [name] MD FACS Signed: 06/24/2022 5:10:18 PM By: Adline Peals Previous Signature: 06/24/2022 9:37:39 AM Version By: [name] MD FACS Entered By: Adline Peals on 06/24/2022 11:09:57

## 2022-06-24 NOTE — Progress Notes (Signed)
TIMIA, CASSELMAN ([id number]) Visit Report for 06/24/2022 Arrival Information Details Patient Name: Date of Service: [place], [name] 06/24/2022 9:15 A M Medical Record Number: [id number] Patient Account Number: [id number] Date of Birth/Sex: Treating RN: 1951-09-25 (71 y.o. Donney Rankins, Lovena Le Primary Care Rockell Faulks: [name] Other Clinician: Referring Nevena Rozenberg: Treating Alleyne Lac/Extender: Earnestine Leys in Treatment: 10 Visit Information History Since Last Visit Added or deleted any medications: No Patient Arrived: Stretcher Any new allergies or adverse reactions: No Arrival Time: 09:12 Had a fall or experienced change in No Accompanied By: self activities of daily living that may affect Transfer Assistance: None risk of falls: Patient Identification Verified: Yes Signs or symptoms of abuse/neglect since last visito No Secondary Verification Process Completed: Yes Hospitalized since last visit: No Patient Requires Transmission-Based Precautions: No Implantable device outside of the clinic excluding No Patient Has Alerts: No cellular tissue based products placed in the center since last visit: Has Dressing in Place as Prescribed: Yes Has Compression in Place as Prescribed: Yes Pain Present Now: No Electronic Signature(s) Signed: 06/24/2022 5:10:18 PM By: Adline Peals Entered By: Adline Peals on 06/24/2022 09:15:04 -------------------------------------------------------------------------------- Clinic Level of Care Assessment Details Patient Name: Date of Service: [place], [name] 06/24/2022 9:15 A M Medical Record Number: [id number] Patient Account Number: [id number] Date of Birth/Sex: Treating RN: July 02, 1951 (71 y.o. [name] Primary Care Jyrah Blye: [name] Other Clinician: Referring Klohe Lovering: Treating Aanika Defoor/Extender: Earnestine Leys in Treatment: 10 Clinic Level of Care Assessment  Items TOOL 4 Quantity Score X- 1 0 Use when only an EandM is performed on FOLLOW-UP visit ASSESSMENTS - Nursing Assessment / Reassessment X- 1 10 Reassessment of Co-morbidities (includes updates in patient status) X- 1 5 Reassessment of Adherence to Treatment Plan ASSESSMENTS - Wound and Skin A ssessment / Reassessment X - Simple Wound Assessment / Reassessment - one wound 1 5 '[]'$  - 0 Complex Wound Assessment / Reassessment - multiple wounds '[]'$  - 0 Dermatologic / Skin Assessment (not related to wound area) ASSESSMENTS - Focused Assessment X- 1 5 Circumferential Edema Measurements - multi extremities '[]'$  - 0 Nutritional Assessment / Counseling / Intervention X- 1 5 Lower Extremity Assessment (monofilament, tuning fork, pulses) '[]'$  - 0 Peripheral Arterial Disease Assessment (using hand held doppler) ASSESSMENTS - Ostomy and/or Continence Assessment and Care '[]'$  - 0 Incontinence Assessment and Management '[]'$  - 0 Ostomy Care Assessment and Management (repouching, etc.) PROCESS - Coordination of Care X - Simple Patient / Family Education for ongoing care 1 15 '[]'$  - 0 Complex (extensive) Patient / Family Education for ongoing care X- 1 10 Staff obtains Programmer, systems, Records, T Results / Process Orders est '[]'$  - 0 Staff telephones HHA, Nursing Homes / Clarify orders / etc '[]'$  - 0 Routine Transfer to another Facility (non-emergent condition) '[]'$  - 0 Routine Hospital Admission (non-emergent condition) '[]'$  - 0 New Admissions / Biomedical engineer / Ordering NPWT Apligraf, etc. , '[]'$  - 0 Emergency Hospital Admission (emergent condition) X- 1 10 Simple Discharge Coordination '[]'$  - 0 Complex (extensive) Discharge Coordination PROCESS - Special Needs '[]'$  - 0 Pediatric / Minor Patient Management '[]'$  - 0 Isolation Patient Management '[]'$  - 0 Hearing / Language / Visual special needs '[]'$  - 0 Assessment of Community assistance (transportation, D/C planning, etc.) '[]'$  - 0 Additional  assistance / Altered mentation '[]'$  - 0 Support Surface(s) Assessment (bed, cushion, seat, etc.) INTERVENTIONS - Wound Cleansing / Measurement X - Simple Wound Cleansing - one wound 1 5 '[]'$  -  0 Complex Wound Cleansing - multiple wounds X- 1 5 Wound Imaging (photographs - any number of wounds) '[]'$  - 0 Wound Tracing (instead of photographs) X- 1 5 Simple Wound Measurement - one wound '[]'$  - 0 Complex Wound Measurement - multiple wounds INTERVENTIONS - Wound Dressings '[]'$  - 0 Small Wound Dressing one or multiple wounds '[]'$  - 0 Medium Wound Dressing one or multiple wounds '[]'$  - 0 Large Wound Dressing one or multiple wounds '[]'$  - 0 Application of Medications - topical '[]'$  - 0 Application of Medications - injection INTERVENTIONS - Miscellaneous '[]'$  - 0 External ear exam '[]'$  - 0 Specimen Collection (cultures, biopsies, blood, body fluids, etc.) '[]'$  - 0 Specimen(s) / Culture(s) sent or taken to Lab for analysis '[]'$  - 0 Patient Transfer (multiple staff / Civil Service fast streamer / Similar devices) '[]'$  - 0 Simple Staple / Suture removal (25 or less) '[]'$  - 0 Complex Staple / Suture removal (26 or more) '[]'$  - 0 Hypo / Hyperglycemic Management (close monitor of Blood Glucose) '[]'$  - 0 Ankle / Brachial Index (ABI) - do not check if billed separately X- 1 5 Vital Signs Has the patient been seen at the hospital within the last three years: Yes Total Score: 85 Level Of Care: New/Established - Level 3 Electronic Signature(s) Signed: 06/24/2022 5:10:18 PM By: Adline Peals Entered By: Adline Peals on 06/24/2022 11:09:50 -------------------------------------------------------------------------------- Encounter Discharge Information Details Patient Name: Date of Service: [name], [name] 06/24/2022 9:15 A M Medical Record Number: [id number] Patient Account Number: [id number] Date of Birth/Sex: Treating RN: [birth year] (71 y.o. [name] Primary Care Britnee Mcdevitt: [name] Other  Clinician: Referring Meylin Stenzel: Treating Manjot Beumer/Extender: Earnestine Leys in Treatment: 10 Encounter Discharge Information Items Discharge Condition: Stable Ambulatory Status: Walker Discharge Destination: Home Transportation: Private Auto Accompanied By: self Schedule Follow-up Appointment: Yes Clinical Summary of Care: Patient Declined Electronic Signature(s) Signed: 06/24/2022 5:10:18 PM By: Sabas Sous By: Adline Peals on 06/24/2022 11:10:24 -------------------------------------------------------------------------------- Lower Extremity Assessment Details Patient Name: Date of Service: [name], [name] 06/24/2022 9:15 A M Medical Record Number: [id number] Patient Account Number: [id number] Date of Birth/Sex: Treating RN: December 12, 1951 (71 y.o. [name] Primary Care Frankie Scipio: [name] Other Clinician: Referring Ellington Greenslade: Treating Michon Kaczmarek/Extender: Rosalyn Charters Weeks in Treatment: 10 Edema Assessment Assessed: [Left: No] [Right: No] Edema: [Left: N] [Right: o] Calf Left: Right: Point of Measurement: 29 cm From Medial Instep 32.6 cm Ankle Left: Right: Point of Measurement: 18 cm From Medial Instep 19.4 cm Vascular Assessment Pulses: Dorsalis Pedis Palpable: [Left:Yes] Electronic Signature(s) Signed: 06/24/2022 5:10:18 PM By: Adline Peals Entered By: Adline Peals on 06/24/2022 09:16:52 -------------------------------------------------------------------------------- Multi Wound Chart Details Patient Name: Date of Service: [name], [name] 06/24/2022 9:15 A M Medical Record Number: [id number] Patient Account Number: [id number] Date of Birth/Sex: Treating RN: 02-Nov-1951 (71 y.o. [name] Primary Care Thessaly Mccullers: [name] Other Clinician: Referring Muneeb Veras: Treating Juanna Pudlo/Extender: Earnestine Leys in Treatment: 10 Vital Signs Height(in):  64 Pulse(bpm): 37 Weight(lbs): 137 Blood Pressure(mmHg): 80/56 Body Mass Index(BMI): 23.5 Temperature(F): 97.7 Respiratory Rate(breaths/min): 18 Photos: [N/A:N/A] Left, Posterior Lower Leg N/A N/A Wound Location: Blister N/A N/A Wounding Event: Venous Leg Ulcer N/A N/A Primary Etiology: Glaucoma, Arrhythmia, Hypertension, N/A N/A Comorbid History: Peripheral Arterial Disease, Peripheral Venous Disease, Gout, Osteoarthritis 01/21/2022 N/A N/A Date Acquired: 10 N/A N/A Weeks of Treatment: Open N/A N/A Wound Status: No N/A N/A Wound Recurrence: 0x0x0 N/A N/A Measurements L x W x D (  cm) 0 N/A N/A A (cm) : rea 0 N/A N/A Volume (cm) : 100.00% N/A N/A % Reduction in Area: 100.00% N/A N/A % Reduction in Volume: Full Thickness Without Exposed N/A N/A Classification: Support Structures None Present N/A N/A Exudate Amount: Flat and Intact N/A N/A Wound Margin: None Present (0%) N/A N/A Granulation Amount: None Present (0%) N/A N/A Necrotic Amount: Fascia: No N/A N/A Exposed Structures: Fat Layer (Subcutaneous Tissue): No Tendon: No Muscle: No Joint: No Bone: No Large (67-100%) N/A N/A Epithelialization: Treatment Notes Electronic Signature(s) Signed: 06/24/2022 9:35:42 AM By: [name] MD FACS Signed: 06/24/2022 5:10:18 PM By: Adline Peals Entered By: [name] on 06/24/2022 09:35:42 -------------------------------------------------------------------------------- Multi-Disciplinary Care Plan Details Patient Name: Date of Service: [place], [name] 06/24/2022 9:15 A M Medical Record Number: [id number] Patient Account Number: [id number] Date of Birth/Sex: Treating RN: [birth year] (71 y.o. [name] Primary Care Debbie Bellucci: [name] Other Clinician: Referring Tacarra Justo: Treating Desarie Feild/Extender: Earnestine Leys in Treatment: [place] reviewed with physician Active  Inactive Electronic Signature(s) Signed: 06/24/2022 5:10:18 PM By: Sabas Sous By: Adline Peals on 06/24/2022 09:26:14 -------------------------------------------------------------------------------- Pain Assessment Details Patient Name: Date of Service: [name], [name] 06/24/2022 9:15 A M Medical Record Number: [id number] Patient Account Number: [id number] Date of Birth/Sex: Treating RN: 1951/02/03 (71 y.o. [name] Primary Care Akari Defelice: [name] Other Clinician: Referring Rimas Gilham: Treating Andric Kerce/Extender: Earnestine Leys in Treatment: 10 Active Problems Location of Pain Severity and Description of Pain Patient Has Paino No Site Locations Rate the pain. Current Pain Level: 0 Pain Management and Medication Current Pain Management: Electronic Signature(s) Signed: 06/24/2022 5:10:18 PM By: Adline Peals Entered By: Adline Peals on 06/24/2022 09:15:14 -------------------------------------------------------------------------------- Patient/Caregiver Education Details Patient Name: Date of Service: Marguerita Beards, [name] 7/5/2023andnbsp9:15 A M Medical Record Number: [id number] Patient Account Number: [id number] Date of Birth/Gender: Treating RN: May 21, 1951 (71 y.o. [name] Primary Care Physician: [name] Other Clinician: Referring Physician: Treating Physician/Extender: Earnestine Leys in Treatment: 10 Education Assessment Education Provided To: Patient Education Topics Provided Wound/Skin Impairment: Methods: Explain/Verbal Responses: Reinforcements needed, State content correctly Electronic Signature(s) Signed: 06/24/2022 5:10:18 PM By: Adline Peals Entered By: Adline Peals on 06/24/2022 09:18:48 -------------------------------------------------------------------------------- Wound Assessment Details Patient Name: Date of Service: [name], [place] B. 06/24/2022 9:15 A M Medical Record Number: [id number] Patient Account Number: [id number] Date of Birth/Sex: Treating RN: April 09, 1951 (71 y.o. [name] Primary Care Miryam Mcelhinney: [name] Other Clinician: Referring Aolani Piggott: Treating Sahory Nordling/Extender: Rosalyn Charters Weeks in Treatment: 10 Wound Status Wound Number: 14 Primary Venous Leg Ulcer Etiology: Wound Location: Left, Posterior Lower Leg Wound Open Wounding Event: Blister Status: Date Acquired: 01/21/2022 Comorbid Glaucoma, Arrhythmia, Hypertension, Peripheral Arterial Disease, Weeks Of Treatment: 10 History: Peripheral Venous Disease, Gout, Osteoarthritis Clustered Wound: No Photos Wound Measurements Length: (cm) Width: (cm) Depth: (cm) Area: (cm) Volume: (cm) 0 % Reduction in Area: 100% 0 % Reduction in Volume: 100% 0 Epithelialization: Large (67-100%) 0 Tunneling: No 0 Undermining: No Wound Description Classification: Full Thickness Without Exposed Support Structures Wound Margin: Flat and Intact Exudate Amount: None Present Foul Odor After Cleansing: No Slough/Fibrino No Wound Bed Granulation Amount: None Present (0%) Exposed Structure Necrotic Amount: None Present (0%) Fascia Exposed: No Fat Layer (Subcutaneous Tissue) Exposed: No Tendon Exposed: No Muscle Exposed: No Joint Exposed: No Bone Exposed: No Electronic Signature(s) Signed: 06/24/2022 5:10:18 PM By: Adline Peals Entered By: Adline Peals on  06/24/2022 09:20:41 -------------------------------------------------------------------------------- Vitals Details Patient Name: Date of Service: [place], [name] 06/24/2022 9:15 A M Medical Record Number: [id number] Patient Account Number: [id number] Date of Birth/Sex: Treating RN: [birth year] (71 y.o. [name] Primary Care Najee Manninen: [name] Other Clinician: Referring Garlene Apperson: Treating Betina Puckett/Extender: Earnestine Leys in Treatment: 10 Vital Signs Time Taken: 09:15 Temperature (F): 97.7 Height (in): 64 Pulse (bpm): 66 Weight (lbs): 137 Respiratory Rate (breaths/min): 18 Body Mass Index (BMI): 23.5 Blood Pressure (mmHg): 80/56 Reference Range: 80 - 120 mg / dl Electronic Signature(s) Signed: 06/24/2022 5:10:18 PM By: Adline Peals Entered By: Adline Peals on 06/24/2022 09:15:52

## 2022-06-26 ENCOUNTER — Other Ambulatory Visit: Payer: Self-pay | Admitting: Nurse Practitioner

## 2022-07-07 ENCOUNTER — Other Ambulatory Visit: Payer: 59

## 2022-07-07 ENCOUNTER — Ambulatory Visit: Payer: 59 | Admitting: Nurse Practitioner

## 2022-07-07 ENCOUNTER — Other Ambulatory Visit: Payer: Self-pay | Admitting: Nurse Practitioner

## 2022-07-07 DIAGNOSIS — K219 Gastro-esophageal reflux disease without esophagitis: Secondary | ICD-10-CM

## 2022-07-08 ENCOUNTER — Other Ambulatory Visit: Payer: 59

## 2022-07-08 ENCOUNTER — Ambulatory Visit: Payer: 59 | Admitting: Hematology

## 2022-07-13 ENCOUNTER — Encounter: Payer: Self-pay | Admitting: Nurse Practitioner

## 2022-07-13 ENCOUNTER — Ambulatory Visit (INDEPENDENT_AMBULATORY_CARE_PROVIDER_SITE_OTHER): Payer: 59 | Admitting: Nurse Practitioner

## 2022-07-13 VITALS — HR 74 | Temp 98.6°F | Ht 64.0 in | Wt 136.0 lb

## 2022-07-13 DIAGNOSIS — E782 Mixed hyperlipidemia: Secondary | ICD-10-CM | POA: Diagnosis not present

## 2022-07-13 DIAGNOSIS — I10 Essential (primary) hypertension: Secondary | ICD-10-CM

## 2022-07-13 MED ORDER — DILTIAZEM HCL ER COATED BEADS 240 MG PO CP24: 240.0000 mg | ORAL_CAPSULE | Freq: Every morning | ORAL | 0 refills | Status: DC

## 2022-07-13 NOTE — Assessment & Plan Note (Signed)
>>  ASSESSMENT AND PLAN FOR MIXED HYPERLIPIDEMIA WRITTEN ON 07/13/2022  1:54 PM BY IJAOLA, ONYEJE M, NP  Continue Zetia  and fenofibrate  as prescribed.

## 2022-07-13 NOTE — Progress Notes (Signed)
Acute Office Visit  Subjective:     Patient ID: [name], female    DOB: 06-Nov-1951, 71 y.o.   MRN: [id number]  Chief Complaint  Patient presents with   Medication Refill   Hyperlipidemia   Hypertension   Patient is in today for Pt presents for follow up of hypertension. Patient was diagnosed in 2017, The patient is tolerating the medication well without side effects. Compliance with treatment has been good; including taking medication as directed , maintains a healthy diet and regular exercise regimen , and following up as directed. Blood pressure is not well controlled,   Mixed hyperlipidemia  Pt presents with hyperlipidemia. Patient was diagnosed in 04/27/2013 Compliance with treatment has been good The patient is compliant with medications, maintains a low cholesterol diet , follows up as directed , and maintains an exercise regimen . The patient denies experiencing any hypercholesterolemia related symptoms.    Review of Systems  Constitutional: Negative.  Negative for chills and fever.  HENT: Negative.    Eyes: Negative.   Respiratory: Negative.    Cardiovascular: Negative.   Genitourinary: Negative.   Skin: Negative.  Negative for itching and rash.  All other systems reviewed and are negative.       Objective:    Pulse 74   Temp 98.6 F (37 C)   Ht '5\' 4"'$  (1.626 m)   Wt 136 lb (61.7 kg)   SpO2 95%   BMI 23.34 kg/m  BP Readings from Last 3 Encounters:  03/03/22 103/62  02/16/22 101/63  01/19/22 (!) 173/69   Wt Readings from Last 3 Encounters:  07/13/22 136 lb (61.7 kg)  03/03/22 134 lb 6.4 oz (61 kg)  02/16/22 136 lb (61.7 kg)      Physical Exam Vitals and nursing note reviewed.  Constitutional:      Appearance: Normal appearance.  HENT:     Head: Normocephalic.     Right Ear: External ear normal.     Left Ear: External ear normal.     Nose: Nose normal.  Eyes:     Conjunctiva/sclera: Conjunctivae normal.  Cardiovascular:     Rate and Rhythm:  Regular rhythm.     Pulses: Normal pulses.     Heart sounds: Normal heart sounds.  Pulmonary:     Effort: Pulmonary effort is normal.     Breath sounds: Normal breath sounds.  Musculoskeletal:        General: Normal range of motion.  Skin:    General: Skin is warm.     Findings: No rash.  Neurological:     General: No focal deficit present.     Mental Status: She is alert and oriented to person, place, and time.     No results found for any visits on 07/13/22.      Assessment & Plan:   Problem List Items Addressed This Visit       Cardiovascular and Mediastinum   Essential hypertension - Primary    Hypertension is not well controlled on current medication.  Patient ran out of her medication Diovan 240 mg tablet, she has upcoming appointment with cardiology.      Relevant Medications   diltiazem (CARDIZEM CD) 240 MG 24 hr capsule   Other Relevant Orders   Lipid Panel   Comprehensive metabolic panel   CBC with Differential     Other   Hyperlipidemia    Continue Zetia and fenofibrate as prescribed.      Relevant Medications   diltiazem (  CARDIZEM CD) 240 MG 24 hr capsule    Meds ordered this encounter  Medications   diltiazem (CARDIZEM CD) 240 MG 24 hr capsule    Sig: Take 1 capsule (240 mg total) by mouth every morning. (NEEDS TO BE SEEN BEFORE NEXT REFILL)    Dispense:  30 capsule    Refill:  0    Return in about 6 months (around 01/13/2023) for chronic disease management.  Ivy Lynn, NP

## 2022-07-13 NOTE — Assessment & Plan Note (Signed)
Hypertension is not well controlled on current medication.  Patient ran out of her medication Diovan 240 mg tablet, she has upcoming appointment with cardiology.

## 2022-07-13 NOTE — Patient Instructions (Signed)
Hypertension, Adult ?Hypertension is another name for high blood pressure. High blood pressure forces your heart to work harder to pump blood. This can cause problems over time. ?There are two numbers in a blood pressure reading. There is a top number (systolic) over a bottom number (diastolic). It is best to have a blood pressure that is below 120/80. ?What are the causes? ?The cause of this condition is not known. Some other conditions can lead to high blood pressure. ?What increases the risk? ?Some lifestyle factors can make you more likely to develop high blood pressure: ?Smoking. ?Not getting enough exercise or physical activity. ?Being overweight. ?Having too much fat, sugar, calories, or salt (sodium) in your diet. ?Drinking too much alcohol. ?Other risk factors include: ?Having any of these conditions: ?Heart disease. ?Diabetes. ?High cholesterol. ?Kidney disease. ?Obstructive sleep apnea. ?Having a family history of high blood pressure and high cholesterol. ?Age. The risk increases with age. ?Stress. ?What are the signs or symptoms? ?High blood pressure may not cause symptoms. Very high blood pressure (hypertensive crisis) may cause: ?Headache. ?Fast or uneven heartbeats (palpitations). ?Shortness of breath. ?Nosebleed. ?Vomiting or feeling like you may vomit (nauseous). ?Changes in how you see. ?Very bad chest pain. ?Feeling dizzy. ?Seizures. ?How is this treated? ?This condition is treated by making healthy lifestyle changes, such as: ?Eating healthy foods. ?Exercising more. ?Drinking less alcohol. ?Your doctor may prescribe medicine if lifestyle changes do not help enough and if: ?Your top number is above 130. ?Your bottom number is above 80. ?Your personal target blood pressure may vary. ?Follow these instructions at home: ?Eating and drinking ? ?If told, follow the DASH eating plan. To follow this plan: ?Fill one half of your plate at each meal with fruits and vegetables. ?Fill one fourth of your plate  at each meal with whole grains. Whole grains include whole-wheat pasta, brown rice, and whole-grain bread. ?Eat or drink low-fat dairy products, such as skim milk or low-fat yogurt. ?Fill one fourth of your plate at each meal with low-fat (lean) proteins. Low-fat proteins include fish, chicken without skin, eggs, beans, and tofu. ?Avoid fatty meat, cured and processed meat, or chicken with skin. ?Avoid pre-made or processed food. ?Limit the amount of salt in your diet to less than 1,500 mg each day. ?Do not drink alcohol if: ?Your doctor tells you not to drink. ?You are pregnant, may be pregnant, or are planning to become pregnant. ?If you drink alcohol: ?Limit how much you have to: ?0-1 drink a day for women. ?0-2 drinks a day for men. ?Know how much alcohol is in your drink. In the [place] one drink equals one 12 oz bottle of beer (355 mL), one 5 oz glass of wine (148 mL), or one 1? oz glass of hard liquor (44 mL). ?Lifestyle ? ?Work with your doctor to stay at a healthy weight or to lose weight. Ask your doctor what the best weight is for you. ?Get at least 30 minutes of exercise that causes your heart to beat faster (aerobic exercise) most days of the week. This may include walking, swimming, or biking. ?Get at least 30 minutes of exercise that strengthens your muscles (resistance exercise) at least 3 days a week. This may include lifting weights or doing Pilates. ?Do not smoke or use any products that contain nicotine or tobacco. If you need help quitting, ask your doctor. ?Check your blood pressure at home as told by your doctor. ?Keep all follow-up visits. ?Medicines ?Take over-the-counter and prescription medicines   only as told by your doctor. Follow directions carefully. ?Do not skip doses of blood pressure medicine. The medicine does not work as well if you skip doses. Skipping doses also puts you at risk for problems. ?Ask your doctor about side effects or reactions to medicines that you should watch  for. ?Contact a doctor if: ?You think you are having a reaction to the medicine you are taking. ?You have headaches that keep coming back. ?You feel dizzy. ?You have swelling in your ankles. ?You have trouble with your vision. ?Get help right away if: ?You get a very bad headache. ?You start to feel mixed up (confused). ?You feel weak or numb. ?You feel faint. ?You have very bad pain in your: ?Chest. ?Belly (abdomen). ?You vomit more than once. ?You have trouble breathing. ?These symptoms may be an emergency. Get help right away. Call 911. ?Do not wait to see if the symptoms will go away. ?Do not drive yourself to the hospital. ?Summary ?Hypertension is another name for high blood pressure. ?High blood pressure forces your heart to work harder to pump blood. ?For most people, a normal blood pressure is less than 120/80. ?Making healthy choices can help lower blood pressure. If your blood pressure does not get lower with healthy choices, you may need to take medicine. ?This information is not intended to replace advice given to you by your health care provider. Make sure you discuss any questions you have with your health care provider. ?Document Revised: 09/25/2021 Document Reviewed: 09/25/2021 ?Elsevier Patient Education ? 2023 [employer] ? ?

## 2022-07-13 NOTE — Assessment & Plan Note (Signed)
Continue Zetia and fenofibrate as prescribed.

## 2022-07-14 ENCOUNTER — Other Ambulatory Visit: Payer: Self-pay

## 2022-07-14 ENCOUNTER — Inpatient Hospital Stay: Payer: 59

## 2022-07-14 ENCOUNTER — Encounter (HOSPITAL_COMMUNITY): Payer: Self-pay

## 2022-07-14 ENCOUNTER — Telehealth: Payer: Self-pay | Admitting: Nurse Practitioner

## 2022-07-14 ENCOUNTER — Encounter: Payer: Self-pay | Admitting: Hematology

## 2022-07-14 ENCOUNTER — Emergency Department (HOSPITAL_COMMUNITY)
Admission: EM | Admit: 2022-07-14 | Discharge: 2022-07-14 | Disposition: A | Discharge status: Home / Self Care | Payer: 59 | Source: Home / Self Care | Attending: Emergency Medicine | Admitting: Emergency Medicine

## 2022-07-14 ENCOUNTER — Telehealth: Payer: Self-pay | Admitting: Hematology

## 2022-07-14 ENCOUNTER — Inpatient Hospital Stay: Payer: 59 | Attending: Nurse Practitioner | Admitting: Hematology

## 2022-07-14 VITALS — BP 113/63 | HR 65 | Temp 98.1°F | Resp 18 | Ht 64.0 in | Wt 136.0 lb

## 2022-07-14 DIAGNOSIS — Z7901 Long term (current) use of anticoagulants: Secondary | ICD-10-CM | POA: Insufficient documentation

## 2022-07-14 DIAGNOSIS — Z86718 Personal history of other venous thrombosis and embolism: Secondary | ICD-10-CM | POA: Diagnosis not present

## 2022-07-14 DIAGNOSIS — N289 Disorder of kidney and ureter, unspecified: Secondary | ICD-10-CM | POA: Insufficient documentation

## 2022-07-14 DIAGNOSIS — I252 Old myocardial infarction: Secondary | ICD-10-CM | POA: Diagnosis not present

## 2022-07-14 DIAGNOSIS — R7989 Other specified abnormal findings of blood chemistry: Secondary | ICD-10-CM | POA: Diagnosis not present

## 2022-07-14 DIAGNOSIS — I10 Essential (primary) hypertension: Secondary | ICD-10-CM | POA: Insufficient documentation

## 2022-07-14 DIAGNOSIS — D45 Polycythemia vera: Secondary | ICD-10-CM | POA: Diagnosis present

## 2022-07-14 DIAGNOSIS — I480 Paroxysmal atrial fibrillation: Secondary | ICD-10-CM | POA: Diagnosis not present

## 2022-07-14 DIAGNOSIS — Z89611 Acquired absence of right leg above knee: Secondary | ICD-10-CM | POA: Diagnosis not present

## 2022-07-14 DIAGNOSIS — Z7982 Long term (current) use of aspirin: Secondary | ICD-10-CM | POA: Insufficient documentation

## 2022-07-14 DIAGNOSIS — Z79899 Other long term (current) drug therapy: Secondary | ICD-10-CM | POA: Insufficient documentation

## 2022-07-14 DIAGNOSIS — Z8673 Personal history of transient ischemic attack (TIA), and cerebral infarction without residual deficits: Secondary | ICD-10-CM | POA: Insufficient documentation

## 2022-07-14 DIAGNOSIS — D508 Other iron deficiency anemias: Secondary | ICD-10-CM | POA: Diagnosis present

## 2022-07-14 LAB — CBC WITH DIFFERENTIAL/PLATELET
Abs Immature Granulocytes: 0.09 10*3/uL — ABNORMAL HIGH (ref 0.00–0.07)
Basophils Absolute: 0.2 10*3/uL (ref 0.0–0.2)
Basophils Absolute: 0.1 10*3/uL (ref 0.0–0.1)
Basophils Relative: 1 %
Basos: 2 %
EOS (ABSOLUTE): 0.3 10*3/uL (ref 0.0–0.4)
Eos: 3 %
Eosinophils Absolute: 0.3 10*3/uL (ref 0.0–0.5)
Eosinophils Relative: 3 %
HCT: 37.3 % (ref 36.0–46.0)
Hematocrit: 40.5 % (ref 34.0–46.6)
Hemoglobin: 11.7 g/dL (ref 11.1–15.9)
Hemoglobin: 10.4 g/dL — ABNORMAL LOW (ref 12.0–15.0)
Immature Grans (Abs): 0.1 10*3/uL (ref 0.0–0.1)
Immature Granulocytes: 1 %
Immature Granulocytes: 1 %
Lymphocytes Absolute: 1 10*3/uL (ref 0.7–3.1)
Lymphocytes Relative: 10 %
Lymphs Abs: 1.1 10*3/uL (ref 0.7–4.0)
Lymphs: 8 %
MCH: 22.5 pg — ABNORMAL LOW (ref 26.6–33.0)
MCH: 22.5 pg — ABNORMAL LOW (ref 26.0–34.0)
MCHC: 28.9 g/dL — ABNORMAL LOW (ref 31.5–35.7)
MCHC: 27.9 g/dL — ABNORMAL LOW (ref 30.0–36.0)
MCV: 78 fL — ABNORMAL LOW (ref 79–97)
MCV: 80.6 fL (ref 80.0–100.0)
Monocytes Absolute: 0.3 10*3/uL (ref 0.1–0.9)
Monocytes Absolute: 0.3 10*3/uL (ref 0.1–1.0)
Monocytes Relative: 2 %
Monocytes: 2 %
Neutro Abs: 9.1 10*3/uL — ABNORMAL HIGH (ref 1.7–7.7)
Neutrophils Absolute: 10.7 10*3/uL — ABNORMAL HIGH (ref 1.4–7.0)
Neutrophils Relative %: 83 %
Neutrophils: 84 %
Platelets: 338 10*3/uL (ref 150–450)
Platelets: 259 10*3/uL (ref 150–400)
RBC: 5.21 x10E6/uL (ref 3.77–5.28)
RBC: 4.63 MIL/uL (ref 3.87–5.11)
RDW: 17.8 % — ABNORMAL HIGH (ref 11.7–15.4)
RDW: 18.5 % — ABNORMAL HIGH (ref 11.5–15.5)
WBC: 12.7 10*3/uL — ABNORMAL HIGH (ref 3.4–10.8)
WBC: 10.9 10*3/uL — ABNORMAL HIGH (ref 4.0–10.5)
nRBC: 0 % (ref 0.0–0.2)

## 2022-07-14 LAB — COMPREHENSIVE METABOLIC PANEL
ALT: 14 IU/L (ref 0–32)
AST: 19 IU/L (ref 0–40)
Albumin/Globulin Ratio: 1.5 (ref 1.2–2.2)
Albumin: 3.9 g/dL (ref 3.9–4.9)
Alkaline Phosphatase: 75 IU/L (ref 44–121)
BUN/Creatinine Ratio: 24 (ref 12–28)
BUN: 27 mg/dL (ref 8–27)
Bilirubin Total: 0.3 mg/dL (ref 0.0–1.2)
CO2: 17 mmol/L — ABNORMAL LOW (ref 20–29)
Calcium: 8.8 mg/dL (ref 8.7–10.3)
Chloride: 105 mmol/L (ref 96–106)
Creatinine, Ser: 1.12 mg/dL — ABNORMAL HIGH (ref 0.57–1.00)
Globulin, Total: 2.6 g/dL (ref 1.5–4.5)
Glucose: 84 mg/dL (ref 70–99)
Potassium: 6.3 mmol/L (ref 3.5–5.2)
Sodium: 138 mmol/L (ref 134–144)
Total Protein: 6.5 g/dL (ref 6.0–8.5)
eGFR: 53 mL/min/{1.73_m2} — ABNORMAL LOW (ref >59)

## 2022-07-14 LAB — LIPID PANEL
Chol/HDL Ratio: 4 ratio (ref 0.0–4.4)
Cholesterol, Total: 178 mg/dL (ref 100–199)
HDL: 45 mg/dL (ref >39)
LDL Chol Calc (NIH): 109 mg/dL — ABNORMAL HIGH (ref 0–99)
Triglycerides: 133 mg/dL (ref 0–149)
VLDL Cholesterol Cal: 24 mg/dL (ref 5–40)

## 2022-07-14 LAB — BASIC METABOLIC PANEL
Anion gap: 7 (ref 5–15)
BUN: 29 mg/dL — ABNORMAL HIGH (ref 8–23)
CO2: 20 mmol/L — ABNORMAL LOW (ref 22–32)
Calcium: 7.9 mg/dL — ABNORMAL LOW (ref 8.9–10.3)
Chloride: 110 mmol/L (ref 98–111)
Creatinine, Ser: 1.1 mg/dL — ABNORMAL HIGH (ref 0.44–1.00)
GFR, Estimated: 54 mL/min — ABNORMAL LOW (ref >60)
Glucose, Bld: 110 mg/dL — ABNORMAL HIGH (ref 70–99)
Potassium: 5.1 mmol/L (ref 3.5–5.1)
Sodium: 137 mmol/L (ref 135–145)

## 2022-07-14 NOTE — Telephone Encounter (Signed)
[employer] called with Critical Lab Result.  Potassium: 6.3

## 2022-07-14 NOTE — ED Triage Notes (Signed)
Pt was seen at her PCP yesterday and had blood work drawn. PCP called pt this afternoon stating her potassium was 6.4 and told her to come to the ED to be evaluated.

## 2022-07-14 NOTE — ED Provider Notes (Signed)
[facility] EMERGENCY DEPARTMENT Provider Note   CSN: [id number] Arrival date & time: 07/14/22  1911     History  Chief Complaint  Patient presents with   Abnormal Labs    [name] is a 71 y.o. female.  Patient contacted by primary care doctor for labs that were done yesterday for a potassium of 6.4.  Patient without any symptoms or any complaints.  Patient feels fine.  Past medical history is significant for hypertension peripheral vascular disease history of proximal atrial fibrillation on Eliquis.  Non-STEMI in 2014.  Carotid disease bilaterally diagnosed in 2014 hyperlipidemia polycythemia vera diagnosed in 2016 followed by hematology oncology for that.  Patient had a stroke in 2006 deemed to be a TIA.  Past surgical history significant for iliac artery stent left heart catheterization in 42 cardiac catheterization in 2016 and 2017 femoral-popliteal bypass graft 2017 patch angioplasty 2017 embolectomy 2017 femoral-popliteal bypass graft 2018 amputation right above-the-knee amputation 2018.  Patient patient former smoker quit in 2013.  Patient's medications significant for cardia exam and Eliquis and flecainide.       Home Medications Prior to Admission medications   Medication Sig Start Date End Date Taking? Authorizing Provider  acetaminophen (TYLENOL) 500 MG tablet Take 2 tablets (1,000 mg total) by mouth every 6 (six) hours. 09/16/18  Yes Norm Parcel, PA-C  ARTIFICIAL TEAR OP Place 1 drop into both eyes as needed (for dry eyes).   Yes [provider]  aspirin EC 325 MG tablet Take 325 mg by mouth daily.   Yes [provider]  diclofenac Sodium (VOLTAREN) 1 % GEL Apply 2 g topically 4 (four) times daily. 09/03/21  Yes Ivy Lynn, NP  diltiazem (CARDIZEM CD) 240 MG 24 hr capsule Take 1 capsule (240 mg total) by mouth every morning. (NEEDS TO BE SEEN BEFORE NEXT REFILL) 07/13/22  Yes Ivy Lynn, NP  ELIQUIS 5 MG TABS tablet TAKE ONE TABLET  TWICE DAILY 04/30/22  Yes Ivy Lynn, NP  escitalopram (LEXAPRO) 20 MG tablet TAKE ONE TABLET ONCE DAILY 06/08/22  Yes Ivy Lynn, NP  ezetimibe (ZETIA) 10 MG tablet Take 1 tablet (10 mg total) by mouth daily. 02/16/22  Yes Ivy Lynn, NP  fenofibrate (TRICOR) 145 MG tablet TAKE ONE TABLET ONCE DAILY 04/30/22  Yes Ivy Lynn, NP  flecainide (TAMBOCOR) 50 MG tablet TAKE 1 TABLET 2 TIMES A DAY 05/08/22  Yes Ivy Lynn, NP  hydroxyurea (HYDREA) 500 MG capsule TAKE 1 [place] Patient taking differently: Take 500 mg by mouth See admin instructions. Take 1 capsule by mouth every day except for Sundays 05/31/22  Yes Kalman Shan, Wilhemina Cash, NP  mupirocin cream (BACTROBAN) 2 % Apply 1 application topically 2 (two) times daily. 02/16/22  Yes Ivy Lynn, NP  omeprazole (PRILOSEC) 20 MG capsule TAKE ONE CAPSULE TWICE DAILY BEFORE MEALS Patient taking differently: Take 20 mg by mouth 2 (two) times daily before a meal. TAKE ONE CAPSULE TWICE DAILY BEFORE MEALS 07/07/22  Yes Ivy Lynn, NP  traMADol (ULTRAM) 50 MG tablet Take 1 tablet (50 mg total) by mouth daily as needed for moderate pain. 02/16/22  Yes Ivy Lynn, NP  valsartan (DIOVAN) 160 MG tablet Take 160 mg every afternoon 12/23/21  Yes Lorretta Harp, MD  valsartan (DIOVAN) 320 MG tablet Take 320 mg every morning 12/23/21  Yes Lorretta Harp, MD  collagenase (SANTYL) 250 UNIT/GM ointment  [provider]  lisinopril (ZESTRIL) 20 MG tablet     [provider]      Allergies    Atenolol, Demerol  [meperidine hcl], Demerol [meperidine], Gabapentin, Inderal [propranolol], Prednisone, Statins, Warfarin and related, Crestor [rosuvastatin], Docosahexaenoic acid (dha), Lactose intolerance (gi), Lipitor [atorvastatin], Lovaza [omega-3-acid ethyl esters], Penicillins, Pravastatin, Simvastatin, Trilipix [choline fenofibrate], Warfarin, Hydralazine, Effexor [venlafaxine hcl],  Nexium [esomeprazole magnesium], Percocet [oxycodone-acetaminophen], and Plavix [clopidogrel bisulfate]    Review of Systems   Review of Systems  Constitutional:  Negative for chills and fever.  HENT:  Negative for ear pain and sore throat.   Eyes:  Negative for pain and visual disturbance.  Respiratory:  Negative for cough and shortness of breath.   Cardiovascular:  Negative for chest pain and palpitations.  Gastrointestinal:  Negative for abdominal pain and vomiting.  Genitourinary:  Negative for dysuria and hematuria.  Musculoskeletal:  Negative for arthralgias and back pain.  Skin:  Negative for color change and rash.  Neurological:  Negative for seizures and syncope.  All other systems reviewed and are negative.   Physical Exam Updated Vital Signs BP 111/72   Pulse 76   Temp 98.2 F (36.8 C) (Oral)   Resp 20   Ht 1.626 m ('5\' 4"'$ )   Wt 61.7 kg   SpO2 95%   BMI 23.34 kg/m  Physical Exam Vitals and nursing note reviewed.  Constitutional:      General: She is not in acute distress.    Appearance: Normal appearance. She is well-developed.  HENT:     Head: Normocephalic and atraumatic.  Eyes:     Extraocular Movements: Extraocular movements intact.     Conjunctiva/sclera: Conjunctivae normal.     Pupils: Pupils are equal, round, and reactive to light.  Cardiovascular:     Rate and Rhythm: Normal rate and regular rhythm.     Heart sounds: No murmur heard. Pulmonary:     Effort: Pulmonary effort is normal. No respiratory distress.     Breath sounds: Normal breath sounds.  Abdominal:     Palpations: Abdomen is soft.     Tenderness: There is no abdominal tenderness.  Musculoskeletal:        General: No swelling.     Cervical back: Normal range of motion and neck supple.     Comments: Right above-the-knee amputation  Skin:    General: Skin is warm and dry.     Capillary Refill: Capillary refill takes less than 2 seconds.  Neurological:     General: No focal deficit  present.     Mental Status: She is alert and oriented to person, place, and time.     Cranial Nerves: No cranial nerve deficit.     Sensory: No sensory deficit.     Motor: No weakness.  Psychiatric:        Mood and Affect: Mood normal.     ED Results / Procedures / Treatments   Labs (all labs ordered are listed, but only abnormal results are displayed) Labs Reviewed  CBC WITH DIFFERENTIAL/PLATELET - Abnormal; Notable for the following components:      Result Value   WBC 10.9 (*)    Hemoglobin 10.4 (*)    MCH 22.5 (*)    MCHC 27.9 (*)    RDW 18.5 (*)    Neutro Abs 9.1 (*)    Abs Immature Granulocytes 0.09 (*)    All other components within normal limits  BASIC METABOLIC PANEL - Abnormal; Notable for the following components:  CO2 20 (*)    Glucose, Bld 110 (*)    BUN 29 (*)    Creatinine, Ser 1.10 (*)    Calcium 7.9 (*)    GFR, Estimated 54 (*)    All other components within normal limits    EKG EKG Interpretation  Date/Time:  Tuesday July 14 2022 20:21:21 EDT Ventricular Rate:  74 PR Interval:  130 QRS Duration: 85 QT Interval:  426 QTC Calculation: 473 R Axis:   35 Text Interpretation: Sinus rhythm Abnormal T, consider ischemia, lateral leads ST elevation, consider anterior injury No significant change since last tracing Confirmed by Fredia Sorrow [phone]) on 07/14/2022 8:33:07 PM  Radiology No results found.  Procedures Procedures    Medications Ordered in ED Medications - No data to display  ED Course/ Medical Decision Making/ A&P                           Medical Decision Making Amount and/or Complexity of Data Reviewed Labs: ordered.  EKG without any significantly widened QRS.  No significant change in the ST or T wave formation compared to old does not seem to be consistent with hyperkalemia.  Patient's labs White blood cell count 10.9 hemoglobin 10.4 platelets 249.  No significant change there patient metabolic panel potassium 5.1 normal  creatinine 1.10 for a GFR of 54 patient is normally in that range.  No significant changes.  Patient is not showing any evidence of hyperkalemia.  Does have some renal insufficiency we will have her follow back up with her primary care doctor.  For recheck of the potassium in about a week or 2.  Final Clinical Impression(s) / ED Diagnoses Final diagnoses:  Renal insufficiency    Rx / DC Orders ED Discharge Orders     None         Fredia Sorrow, MD 07/14/22 2217

## 2022-07-14 NOTE — Telephone Encounter (Signed)
Critical results printed and given to [facility]
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
## 2022-07-14 NOTE — Telephone Encounter (Signed)
.  Called patient to schedule appointment per 7/25 inbasket, patient is aware of date and time.   

## 2022-07-14 NOTE — Discharge Instructions (Addendum)
Potassium is normal at 5.1.  You do have some renal insufficiency but not significantly changed from before.  Follow back up with your primary care doctor.

## 2022-07-29 ENCOUNTER — Other Ambulatory Visit: Payer: Self-pay | Admitting: Nurse Practitioner

## 2022-07-29 DIAGNOSIS — E782 Mixed hyperlipidemia: Secondary | ICD-10-CM

## 2022-07-29 DIAGNOSIS — I482 Chronic atrial fibrillation, unspecified: Secondary | ICD-10-CM

## 2022-07-29 DIAGNOSIS — M1712 Unilateral primary osteoarthritis, left knee: Secondary | ICD-10-CM

## 2022-08-19 ENCOUNTER — Other Ambulatory Visit: Payer: Self-pay | Admitting: Nurse Practitioner

## 2022-08-19 DIAGNOSIS — I10 Essential (primary) hypertension: Secondary | ICD-10-CM

## 2022-08-26 ENCOUNTER — Emergency Department (HOSPITAL_COMMUNITY): Payer: 59

## 2022-08-26 ENCOUNTER — Other Ambulatory Visit: Payer: Self-pay

## 2022-08-26 ENCOUNTER — Encounter (HOSPITAL_COMMUNITY): Payer: Self-pay

## 2022-08-26 ENCOUNTER — Emergency Department (HOSPITAL_COMMUNITY)
Admission: EM | Admit: 2022-08-26 | Discharge: 2022-08-26 | Disposition: A | Discharge status: Home / Self Care | Payer: 59 | Attending: Emergency Medicine | Admitting: Emergency Medicine

## 2022-08-26 DIAGNOSIS — Z79899 Other long term (current) drug therapy: Secondary | ICD-10-CM | POA: Insufficient documentation

## 2022-08-26 DIAGNOSIS — I2510 Atherosclerotic heart disease of native coronary artery without angina pectoris: Secondary | ICD-10-CM | POA: Diagnosis not present

## 2022-08-26 DIAGNOSIS — M79605 Pain in left leg: Secondary | ICD-10-CM | POA: Diagnosis not present

## 2022-08-26 DIAGNOSIS — Z7982 Long term (current) use of aspirin: Secondary | ICD-10-CM | POA: Insufficient documentation

## 2022-08-26 DIAGNOSIS — Z7901 Long term (current) use of anticoagulants: Secondary | ICD-10-CM | POA: Diagnosis not present

## 2022-08-26 DIAGNOSIS — I10 Essential (primary) hypertension: Secondary | ICD-10-CM | POA: Diagnosis not present

## 2022-08-26 LAB — MAGNESIUM: Magnesium: 1.9 mg/dL (ref 1.7–2.4)

## 2022-08-26 LAB — CBC WITH DIFFERENTIAL/PLATELET
Abs Immature Granulocytes: 0 10*3/uL (ref 0.00–0.07)
Band Neutrophils: 3 %
Basophils Absolute: 0.4 10*3/uL — ABNORMAL HIGH (ref 0.0–0.1)
Basophils Relative: 3 %
Eosinophils Absolute: 0.4 10*3/uL (ref 0.0–0.5)
Eosinophils Relative: 3 %
HCT: 33.5 % — ABNORMAL LOW (ref 36.0–46.0)
Hemoglobin: 9.7 g/dL — ABNORMAL LOW (ref 12.0–15.0)
Lymphocytes Relative: 12 %
Lymphs Abs: 1.5 10*3/uL (ref 0.7–4.0)
MCH: 23.2 pg — ABNORMAL LOW (ref 26.0–34.0)
MCHC: 29 g/dL — ABNORMAL LOW (ref 30.0–36.0)
MCV: 80 fL (ref 80.0–100.0)
Monocytes Absolute: 0.2 10*3/uL (ref 0.1–1.0)
Monocytes Relative: 2 %
Neutro Abs: 9.8 10*3/uL — ABNORMAL HIGH (ref 1.7–7.7)
Neutrophils Relative %: 77 %
Platelets: 324 10*3/uL (ref 150–400)
RBC: 4.19 MIL/uL (ref 3.87–5.11)
RDW: 19.2 % — ABNORMAL HIGH (ref 11.5–15.5)
Smear Review: ADEQUATE
WBC: 12.2 10*3/uL — ABNORMAL HIGH (ref 4.0–10.5)
nRBC: 0 % (ref 0.0–0.2)

## 2022-08-26 LAB — COMPREHENSIVE METABOLIC PANEL
ALT: 11 U/L (ref 0–44)
AST: 18 U/L (ref 15–41)
Albumin: 3.1 g/dL — ABNORMAL LOW (ref 3.5–5.0)
Alkaline Phosphatase: 56 U/L (ref 38–126)
Anion gap: 6 (ref 5–15)
BUN: 33 mg/dL — ABNORMAL HIGH (ref 8–23)
CO2: 23 mmol/L (ref 22–32)
Calcium: 8.1 mg/dL — ABNORMAL LOW (ref 8.9–10.3)
Chloride: 109 mmol/L (ref 98–111)
Creatinine, Ser: 1.38 mg/dL — ABNORMAL HIGH (ref 0.44–1.00)
GFR, Estimated: 41 mL/min — ABNORMAL LOW (ref >60)
Glucose, Bld: 78 mg/dL (ref 70–99)
Potassium: 5.1 mmol/L (ref 3.5–5.1)
Sodium: 138 mmol/L (ref 135–145)
Total Bilirubin: 0.6 mg/dL (ref 0.3–1.2)
Total Protein: 6.3 g/dL — ABNORMAL LOW (ref 6.5–8.1)

## 2022-08-26 LAB — LACTIC ACID, PLASMA: Lactic Acid, Venous: 0.8 mmol/L (ref 0.5–1.9)

## 2022-08-26 LAB — CK: Total CK: 16 U/L — ABNORMAL LOW (ref 38–234)

## 2022-08-26 MED ORDER — LACTATED RINGERS IV BOLUS
500.0000 mL | Freq: Once | INTRAVENOUS | Status: AC
  Administered 2022-08-26: 500 mL via INTRAVENOUS

## 2022-08-26 MED ORDER — IOHEXOL 350 MG/ML SOLN
100.0000 mL | Freq: Once | INTRAVENOUS | Status: AC | PRN
  Administered 2022-08-26: 100 mL via INTRAVENOUS

## 2022-08-26 NOTE — ED Triage Notes (Signed)
Pt to er, pt states that she is concerned that she has a blood clot in her L leg, states that she is on three anticoagulants and lost her R leg to a clot.

## 2022-08-26 NOTE — ED Provider Notes (Signed)
[facility] EMERGENCY DEPARTMENT Provider Note   CSN: [id number] Arrival date & time: 08/26/22  1051     History  Chief Complaint  Patient presents with   Leg Pain    Luke B Quarry is a 71 y.o. female.   Leg Pain Patient presents for left leg pain.  Medical history includes PAD s/p stents, atrial fibrillation, CAD, HLD, TIA, CAD, arthritis, claudication, polycythemia, HTN, anemia, right AKA, anxiety, chronic pain.  She is on Eliquis.  Her last dose was this morning.  Patient denies any recent injuries.  She has been experiencing left leg pain since Sunday night.  Pain is most prominent in the lateral and posterior aspects of her knee.  She endorses a new swelling distal to these areas.  Pain is worsened with lifting her leg.  She has continued to be ambulatory and states that weightbearing does not worsen pain.  She typically does not have chronic pain in her left leg.  She does take medications for chronic pain.  She did take tramadol prior to arrival.  Patient denies any other areas of discomfort.  She denies any systemic symptoms.     Home Medications Prior to Admission medications   Medication Sig Start Date End Date Taking? Authorizing Provider  acetaminophen (TYLENOL) 500 MG tablet Take 2 tablets (1,000 mg total) by mouth every 6 (six) hours. 09/16/18   Norm Parcel, PA-C  ARTIFICIAL TEAR OP Place 1 drop into both eyes as needed (for dry eyes).    [provider]  aspirin EC 325 MG tablet Take 325 mg by mouth daily.    [provider]  collagenase (SANTYL) 250 UNIT/GM ointment     [provider]  diclofenac Sodium (VOLTAREN) 1 % GEL Apply 2 g topically 4 (four) times daily. 09/03/21   Ivy Lynn, NP  diltiazem (CARDIZEM CD) 240 MG 24 hr capsule TAKE 1 CAPSULE EVERY MORNING 08/20/22   Ivy Lynn, NP  ELIQUIS 5 MG TABS tablet TAKE ONE TABLET TWICE DAILY 07/29/22   Ivy Lynn, NP  escitalopram (LEXAPRO) 20 MG tablet TAKE ONE TABLET ONCE  DAILY 07/29/22   Ivy Lynn, NP  ezetimibe (ZETIA) 10 MG tablet Take 1 tablet (10 mg total) by mouth daily. 02/16/22   Ivy Lynn, NP  fenofibrate (TRICOR) 145 MG tablet TAKE ONE TABLET ONCE DAILY 07/29/22   Ivy Lynn, NP  flecainide (TAMBOCOR) 50 MG tablet TAKE ONE TABLET TWICE DAILY 07/29/22   Ivy Lynn, NP  hydroxyurea (HYDREA) 500 MG capsule TAKE 1 [place] SUNDAYS Patient taking differently: Take 500 mg by mouth See admin instructions. Take 1 capsule by mouth every day except for Sundays 05/31/22   Alla Feeling, NP  lisinopril (ZESTRIL) 20 MG tablet     [provider]  mupirocin cream (BACTROBAN) 2 % Apply 1 application topically 2 (two) times daily. 02/16/22   Ivy Lynn, NP  omeprazole (PRILOSEC) 20 MG capsule TAKE ONE CAPSULE TWICE DAILY BEFORE MEALS Patient taking differently: Take 20 mg by mouth 2 (two) times daily before a meal. TAKE ONE CAPSULE TWICE DAILY BEFORE MEALS 07/07/22   Ivy Lynn, NP  traMADol (ULTRAM) 50 MG tablet Take 1 tablet (50 mg total) by mouth daily as needed for moderate pain. 02/16/22   Ivy Lynn, NP  valsartan (DIOVAN) 160 MG tablet Take 160 mg every afternoon 12/23/21   Lorretta Harp, MD  valsartan (DIOVAN) 320 MG  tablet Take 320 mg every morning 12/23/21   Lorretta Harp, MD      Allergies    Atenolol, Demerol  [meperidine hcl], Demerol [meperidine], Gabapentin, Inderal [propranolol], Prednisone, Statins, Warfarin and related, Crestor [rosuvastatin], Docosahexaenoic acid (dha), Lactose intolerance (gi), Lipitor [atorvastatin], Lovaza [omega-3-acid ethyl esters], Penicillins, Pravastatin, Simvastatin, Trilipix [choline fenofibrate], Warfarin, Hydralazine, Effexor [venlafaxine hcl], Nexium [esomeprazole magnesium], Percocet [oxycodone-acetaminophen], and Plavix [clopidogrel bisulfate]    Review of Systems   Review of Systems  Cardiovascular:  Positive for leg swelling.  Musculoskeletal:   Positive for arthralgias and myalgias.  All other systems reviewed and are negative.   Physical Exam Updated Vital Signs BP 108/71   Pulse 67   Temp 98.1 F (36.7 C) (Oral)   Resp 18   Ht 5' 5.5" (1.664 m)   Wt 61.7 kg   SpO2 96%   BMI 22.29 kg/m  Physical Exam Vitals and nursing note reviewed.  Constitutional:      General: She is not in acute distress.    Appearance: Normal appearance. She is well-developed. She is not ill-appearing, toxic-appearing or diaphoretic.  HENT:     Head: Normocephalic and atraumatic.     Right Ear: External ear normal.     Left Ear: External ear normal.     Nose: Nose normal.     Mouth/Throat:     Mouth: Mucous membranes are moist.     Pharynx: Oropharynx is clear.  Eyes:     Extraocular Movements: Extraocular movements intact.     Conjunctiva/sclera: Conjunctivae normal.  Cardiovascular:     Rate and Rhythm: Normal rate and regular rhythm.     Heart sounds: No murmur heard. Pulmonary:     Effort: Pulmonary effort is normal. No respiratory distress.  Abdominal:     General: There is no distension.     Palpations: Abdomen is soft.  Musculoskeletal:        General: Tenderness present. No deformity. Normal range of motion.     Cervical back: Normal range of motion and neck supple.     Left lower leg: Edema present.     Comments: Right AKA  Skin:    General: Skin is warm and dry.     Capillary Refill: Capillary refill takes less than 2 seconds.     Coloration: Skin is not jaundiced or pale.  Neurological:     General: No focal deficit present.     Mental Status: She is alert and oriented to person, place, and time.     Cranial Nerves: No cranial nerve deficit.     Sensory: No sensory deficit.     Motor: No weakness.     Coordination: Coordination normal.  Psychiatric:        Mood and Affect: Mood normal.        Behavior: Behavior normal.        Thought Content: Thought content normal.        Judgment: Judgment normal.     ED  Results / Procedures / Treatments   Labs (all labs ordered are listed, but only abnormal results are displayed) Labs Reviewed  CBC WITH DIFFERENTIAL/PLATELET - Abnormal; Notable for the following components:      Result Value   WBC 12.2 (*)    Hemoglobin 9.7 (*)    HCT 33.5 (*)    MCH 23.2 (*)    MCHC 29.0 (*)    RDW 19.2 (*)    Neutro Abs 9.8 (*)    Basophils Absolute 0.4 (*)  All other components within normal limits  COMPREHENSIVE METABOLIC PANEL - Abnormal; Notable for the following components:   BUN 33 (*)    Creatinine, Ser 1.38 (*)    Calcium 8.1 (*)    Total Protein 6.3 (*)    Albumin 3.1 (*)    GFR, Estimated 41 (*)    All other components within normal limits  CK - Abnormal; Notable for the following components:   Total CK 16 (*)    All other components within normal limits  MAGNESIUM  LACTIC ACID, PLASMA    EKG None  Radiology CT Angio Aortobifemoral W and/or Wo Contrast  Result Date: 08/26/2022 CLINICAL DATA:  Left leg pain and redness x1 day. History of prior right-sided above the knee amputation EXAM: CT ANGIOGRAPHY OF ABDOMINAL AORTA WITH ILIOFEMORAL RUNOFF TECHNIQUE: Multidetector CT imaging of the abdomen, pelvis and lower extremities was performed using the standard protocol during bolus administration of intravenous contrast. Multiplanar CT image reconstructions and MIPs were obtained to evaluate the vascular anatomy. RADIATION DOSE REDUCTION: This exam was performed according to the departmental dose-optimization program which includes automated exposure control, adjustment of the mA and/or kV according to patient size and/or use of iterative reconstruction technique. CONTRAST:  159m OMNIPAQUE IOHEXOL 350 MG/ML SOLN COMPARISON:  CT scan of the abdomen and pelvis 09/09/2028 FINDINGS: VASCULAR Aorta: Extensive calcified atherosclerotic plaque throughout the abdominal aorta. No evidence of aneurysm. Mild focal non flow limiting dissection likely secondary to a  penetrating atherosclerotic ulcer in the most infrarenal abdominal aorta just proximal to the bifurcation. Celiac: Modest heterogeneous atherosclerotic plaque along the proximal artery resulting in mild stenosis. No aneurysm or dissection. SMA: Modest heterogeneous atherosclerotic plaque along the proximal SMA resulting in mild stenosis. No aneurysm or dissection. Renals: Single left renal artery. 2 right-sided renal arteries including a small accessory artery to the lower pole. Extensive calcified atherosclerotic plaque at the origin of the right main renal artery results in moderate stenosis. Calcified plaque at the origin of the left renal artery results in mild stenosis. IMA: Patent without evidence of aneurysm, dissection, vasculitis or significant stenosis. RIGHT Lower Extremity Inflow: Extensive calcified atherosclerotic plaque. No evidence of aneurysm. High-grade stenosis present both proximally and distally in the external iliac artery. Outflow: Chronic occlusion of the common femoral, native superficial femoral and prior stent system and bypass grafts. Runoff: Surgical changes of above the knee amputation. LEFT Lower Extremity Inflow: Calcified atherosclerotic plaque throughout the iliac system. Stent system present beginning near the origin of the left internal iliac artery and extending through the external iliac artery. Minimal overlap of the stents. There is focal edge stenosis resulting in significant stenosis in the distal external iliac artery. There appears to be chronic occlusion of the proximal internal iliac artery. Outflow: Calcified plaque along the common femoral artery without significant stenosis. The profunda femoral branches remain patent. Chronic total occlusion of the superficial femoral artery with reconstitution as the P1 segment of the popliteal artery. Multifocal calcified plaque present throughout the popliteal artery. Runoff: Patent 3 vessel runoff to the ankle. Veins: No focal  venous abnormality. Review of the MIP images confirms the above findings. NON-VASCULAR Lower chest: Diffuse bilateral lower lobe bronchial wall thickening. Mild interlobular septal thickening suggest trace 89. No pneumothorax or pleural effusion. The visualized heart is within normal limits in size. Unremarkable distal thoracic esophagus. Hepatobiliary: Normal hepatic contour and morphology. No discrete hepatic lesion. High attenuation material layers in the gallbladder neck consistent with cholelithiasis. No biliary ductal dilatation. Pancreas:  Unremarkable. No pancreatic ductal dilatation or surrounding inflammatory changes. Spleen: Splenomegaly.  No discrete splenic lesion. Adrenals/Urinary Tract: Normal adrenal glands. No hydronephrosis, nephrolithiasis or enhancing lesion. Unremarkable ureters and bladder. Stomach/Bowel: No evidence of obstruction. No focal bowel wall thickening. Lymphatic: No suspicious lymphadenopathy. Reproductive: Uterus and bilateral adnexa are unremarkable. Other: No abdominal wall hernia or abnormality. No abdominopelvic ascites. Musculoskeletal: Surgical changes of right above the knee amputation. No acute fracture or malalignment. No lytic or blastic osseous lesion. IMPRESSION: VASCULAR 1. Focal moderate to high-grade edge stenosis at the distal aspect of the left iliac stent system with approximately 40% luminal narrowing. 2. Chronic occlusion of the left superficial femoral artery with reconstitution at the P1 segment of the popliteal artery. 3. Patent 3 vessel runoff to the ankle on the left. 4. Surgical changes of prior right above the knee amputation with chronic occlusion of the right common and superficial femoral arteries. 5. Right greater than left renal artery stenoses. 6. Extensive atherosclerotic calcifications throughout the aorta. Aortic Atherosclerosis (ICD10-I70.0). NON-VASCULAR 1. Probable mild interstitial pulmonary edema with interlobular septal thickening in the  lung bases. 2. Cholelithiasis. 3. No acute abnormality within the abdomen or pelvis. Electronically Signed   By: Jacqulynn Cadet M.D.   On: 08/26/2022 13:35   US Venous Img Lower Unilateral Left  Result Date: 08/26/2022 CLINICAL DATA:  Left lower extremity swelling and pain. EXAM: LEFT LOWER EXTREMITY VENOUS DOPPLER ULTRASOUND TECHNIQUE: Gray-scale sonography with compression, as well as color and duplex ultrasound, were performed to evaluate the deep venous system(s) from the level of the common femoral vein through the popliteal and proximal calf veins. COMPARISON:  None Available. FINDINGS: VENOUS Normal compressibility of the common femoral, superficial femoral, and popliteal veins, as well as the visualized calf veins. Visualized portions of profunda femoral vein and great saphenous vein unremarkable. No filling defects to suggest DVT on grayscale or color Doppler imaging. Doppler waveforms show normal direction of venous flow, normal respiratory plasticity and response to augmentation. Limited views of the contralateral common femoral vein are unremarkable. OTHER None. Limitations: none IMPRESSION: Negative. Electronically Signed   By: Logan Bores M.D.   On: 08/26/2022 12:35   DG Knee 2 Views Left  Result Date: 08/26/2022 CLINICAL DATA:  Left leg pain and swelling for 3 days. No injury. History of DVT. EXAM: LEFT KNEE - 1-2 VIEW COMPARISON:  04/25/2020. FINDINGS: No acute fracture.  Old, healed fracture of the proximal fibula. Knee joint normally spaced and aligned. Cystic change and sclerosis along the dorsal patella. Small joint effusion. Atherosclerotic vascular calcifications noted posteriorly. IMPRESSION: 1. No fracture or acute finding. 2. Arthropathic changes of the patellofemoral compartment. Small joint effusion. Electronically Signed   By: Lajean Manes M.D.   On: 08/26/2022 11:42    Procedures Procedures    Medications Ordered in ED Medications  lactated ringers bolus 500 mL (0 mLs  Intravenous Stopped 08/26/22 1321)  lactated ringers bolus 500 mL (0 mLs Intravenous Stopped 08/26/22 1500)  iohexol (OMNIPAQUE) 350 MG/ML injection 100 mL (100 mLs Intravenous Contrast Given 08/26/22 1246)    ED Course/ Medical Decision Making/ A&P                           Medical Decision Making Amount and/or Complexity of Data Reviewed Labs: ordered. Radiology: ordered.  Risk Prescription drug management.   This patient presents to the ED for concern of left leg pain, this involves an extensive number of treatment options,  and is a complaint that carries with it a high risk of complications and morbidity.  The differential diagnosis includes DVT, progression of PAD, limb ischemia, arthritis, tendinitis, myositis   Co morbidities that complicate the patient evaluation  PAD s/p stents, atrial fibrillation, CAD, HLD, TIA, CAD, arthritis, claudication, polycythemia, HTN, anemia, right AKA, anxiety, chronic pain   Additional history obtained:  Additional history obtained from N/A External records from outside source obtained and reviewed including EMR   Lab Tests:  I Ordered, and personally interpreted labs.  The pertinent results include: Slight anemia, baseline leukocytosis, normal lactate, normal CK, normal electrolytes, slight increase in creatinine from baseline  Imaging Studies ordered:  I ordered imaging studies including left knee x-ray, left leg DVT study, CTA with runoff I independently visualized and interpreted imaging which showed no acute osseous abnormalities of knee, negative DVT study. CTA showed focal moderate to high-grade edge stenosis of distal left iliac stent system with 40% luminal narrowing; chronic occlusion of left SFA with reconstitution at popliteal artery, and patent three-vessel runoff to ankle I agree with the radiologist interpretation   Cardiac Monitoring: / EKG:  The patient was maintained on a cardiac monitor.  I personally viewed and  interpreted the cardiac monitored which showed an underlying rhythm of: Sinus rhythm   Consultations Obtained:  I requested consultation with the vascular surgeon, Dr. Trula Slade,  and discussed lab and imaging findings as well as pertinent plan - they recommend: Outpatient follow-up   Problem List / ED Course / Critical interventions / Medication management  Patient is a pleasant 71 year old female who presents with 3 days of left lower extremity pain.  Pain is located primarily in the lateral and posterior aspects of her knee area.  She has no knee swelling, warmth, or erythema on exam.  There is mild tenderness associated with her pain.  She denies any increased pain with weightbearing.  She does have increased pain with lifting her leg from a sitting position.  PT and DP pulses were identified with Doppler.  Patient declined any medication for analgesia.  Lab work and imaging studies were ordered.  On CTA, patient was found to have focal moderate to high-grade edge stenosis of distal left iliac stent system with 40% luminal narrowing; chronic occlusion of left SFA with reconstitution at popliteal artery, and patent three-vessel runoff to ankle.  I discussed these findings with vascular surgeon on-call, Dr. Trula Slade, who recommends outpatient follow-up.  Patient was advised to contact her vascular surgeon, Dr. Donnetta Hutching, who is in his office today, to schedule a close follow-up appointment.  She was discharged in stable condition.   Social Determinants of Health:  Has access to outpatient care         Final Clinical Impression(s) / ED Diagnoses Final diagnoses:  Left leg pain    Rx / DC Orders ED Discharge Orders     None         Godfrey Pick, MD 08/26/22 1645

## 2022-08-26 NOTE — Discharge Instructions (Signed)
Call Dr. Luther Parody office today to inform him of your ED visit and need for close follow-up appointment.  Continue home medications as prescribed.  Return to the emergency department at any time for any new or worsening symptoms of concern.

## 2022-08-26 NOTE — ED Notes (Signed)
Pt ambulated to and from bathroom with minimal assist from nurse assistance.

## 2022-09-01 ENCOUNTER — Ambulatory Visit: Payer: 59 | Admitting: General Practice

## 2022-09-04 ENCOUNTER — Other Ambulatory Visit: Payer: Self-pay | Admitting: Nurse Practitioner

## 2022-09-04 NOTE — Telephone Encounter (Signed)
Tramadol denied. Pt needs appt for refills.

## 2022-09-09 NOTE — Progress Notes (Signed)
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 spent 13 minutes examining this patient, reviewing medications, and using patient centered shared decision making involving her cardiac care.  Prior to her visit I spent greater than 20 minutes reviewing her past medical history,  medications, and prior cardiac tests.

## 2022-09-11 ENCOUNTER — Ambulatory Visit: Payer: 59 | Attending: General Practice | Admitting: General Practice

## 2022-09-11 ENCOUNTER — Encounter: Payer: Self-pay | Admitting: General Practice

## 2022-09-11 VITALS — BP 102/60 | HR 86 | Ht 65.5 in | Wt 134.0 lb

## 2022-09-11 DIAGNOSIS — I739 Peripheral vascular disease, unspecified: Secondary | ICD-10-CM

## 2022-09-11 DIAGNOSIS — I480 Paroxysmal atrial fibrillation: Secondary | ICD-10-CM | POA: Diagnosis not present

## 2022-09-11 DIAGNOSIS — I2510 Atherosclerotic heart disease of native coronary artery without angina pectoris: Secondary | ICD-10-CM | POA: Diagnosis not present

## 2022-09-11 DIAGNOSIS — I10 Essential (primary) hypertension: Secondary | ICD-10-CM

## 2022-09-11 DIAGNOSIS — E782 Mixed hyperlipidemia: Secondary | ICD-10-CM

## 2022-09-11 NOTE — Patient Instructions (Signed)
Medication Instructions:  The current medical regimen is effective;  continue present plan and medications as directed. Please refer to the Current Medication list given to you today.  *If you need a refill on your cardiac medications before your next appointment, please call your pharmacy*  Lab Work: NONE If you have labs (blood work) drawn today and your tests are completely normal, you will receive your results only by: [place] (if you have MyChart) OR  A paper copy in the mail If you have any lab test that is abnormal or we need to change your treatment, we will call you to review the results.  Testing/Procedures: DUE FOR YOUR YEARLY ULTRASOUNDS-CAROTID, LOWER EXTREMITY   Follow-Up: At [facility], you and your health needs are our priority.  As part of our continuing mission to provide you with exceptional heart care, we have created designated Provider Care Teams.  These Care Teams include your primary Cardiologist (physician) and Advanced Practice Providers (APPs -  Physician Assistants and Nurse Practitioners) who all work together to provide you with the care you need, when you need it.  Your next appointment:   6 month(s)  The format for your next appointment:   In Person  Provider:   Quay Burow, MD     Important Information About Sugar

## 2022-09-15 ENCOUNTER — Encounter: Payer: Self-pay | Admitting: Nurse Practitioner

## 2022-09-15 ENCOUNTER — Ambulatory Visit (INDEPENDENT_AMBULATORY_CARE_PROVIDER_SITE_OTHER): Payer: 59 | Admitting: Nurse Practitioner

## 2022-09-15 DIAGNOSIS — Z91199 Patient's noncompliance with other medical treatment and regimen due to unspecified reason: Secondary | ICD-10-CM

## 2022-09-15 NOTE — Progress Notes (Signed)
No show

## 2022-09-30 ENCOUNTER — Other Ambulatory Visit: Payer: Self-pay | Admitting: Nurse Practitioner

## 2022-10-06 ENCOUNTER — Other Ambulatory Visit: Payer: Self-pay | Admitting: Nurse Practitioner

## 2022-10-06 DIAGNOSIS — K219 Gastro-esophageal reflux disease without esophagitis: Secondary | ICD-10-CM

## 2022-10-09 ENCOUNTER — Encounter: Payer: Self-pay | Admitting: Nurse Practitioner

## 2022-10-09 ENCOUNTER — Ambulatory Visit (INDEPENDENT_AMBULATORY_CARE_PROVIDER_SITE_OTHER): Payer: 59 | Admitting: Nurse Practitioner

## 2022-10-09 VITALS — BP 148/66 | HR 70 | Ht 65.0 in | Wt 136.0 lb

## 2022-10-09 DIAGNOSIS — D509 Iron deficiency anemia, unspecified: Secondary | ICD-10-CM | POA: Insufficient documentation

## 2022-10-09 DIAGNOSIS — I10 Essential (primary) hypertension: Secondary | ICD-10-CM | POA: Diagnosis not present

## 2022-10-09 DIAGNOSIS — E782 Mixed hyperlipidemia: Secondary | ICD-10-CM | POA: Diagnosis not present

## 2022-10-09 DIAGNOSIS — M5136 Other intervertebral disc degeneration, lumbar region: Secondary | ICD-10-CM

## 2022-10-09 DIAGNOSIS — D649 Anemia, unspecified: Secondary | ICD-10-CM | POA: Insufficient documentation

## 2022-10-09 DIAGNOSIS — F339 Major depressive disorder, recurrent, unspecified: Secondary | ICD-10-CM

## 2022-10-09 DIAGNOSIS — E785 Hyperlipidemia, unspecified: Secondary | ICD-10-CM

## 2022-10-09 MED ORDER — TRAMADOL HCL 50 MG PO TABS: 50.0000 mg | ORAL_TABLET | Freq: Every day | ORAL | 0 refills | Status: AC | PRN

## 2022-10-09 MED ORDER — TRAMADOL HCL 50 MG PO TABS: 50.0000 mg | ORAL_TABLET | Freq: Every day | ORAL | 0 refills | Status: DC | PRN

## 2022-10-09 NOTE — Assessment & Plan Note (Signed)
Pain managed with tramadol 50 mg tablet by mouth as needed daily.  Pharmacy.

## 2022-10-09 NOTE — Assessment & Plan Note (Signed)
Patient was recently seen by cardiology no changes made.  Continue current medication dose.
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
## 2022-10-09 NOTE — Assessment & Plan Note (Signed)
Depression well-controlled on current medication Lexapro 10 mg tablet by mouth daily.

## 2022-10-09 NOTE — Assessment & Plan Note (Signed)
Lipid panel completed results pending. 

## 2022-10-09 NOTE — Assessment & Plan Note (Addendum)
I discussed patient's low hemoglobin on lab results completed 9 05/2022.  Patient at this time reports that her hemoglobin is supposed to be the way it is right now due to not wanting to have elevated white counts.  I provided education to patient that her hemoglobin is at 9.6 and iron supplement will be beneficial.  Patient verbalized understanding.  Patient verbalize that she does not need Rx sent to pharmacy for iron and will use supplements she already has at home.

## 2022-10-09 NOTE — Patient Instructions (Signed)
Degenerative Disk Disease  Degenerative disk disease is a condition caused by changes that occur in the spinal disks as a person ages. Spinal disks are soft and compressible disks located between the bones of your spine (vertebrae). These disks act like shock absorbers. Degenerative disk disease can affect the whole spine. However, the neck and lower back are most often affected. Many changes can occur in the spinal disks with aging, such as: The spinal disks may dry and shrink. Small tears may occur in the tough, outer covering of the disk (annulus). The disk space may become smaller due to loss of water. Abnormal growths in the bone (spurs) may occur. This can put pressure on the nerve roots exiting the spinal canal, causing pain. The spinal canal may become narrowed. What are the causes? This condition may be caused by: Normal degeneration with age. Injuries. Certain activities and sports that cause damage. What increases the risk? The following factors may make you more likely to develop this condition: Being overweight. Having a family history of degenerative disk disease. Smoking and use of products that contain nicotine and tobacco. Sudden injury. Doing work that requires heavy lifting. What are the signs or symptoms? Symptoms of this condition include: Pain that varies in intensity. Some people have no pain, while others have severe pain. The location of the pain depends on the part of your backbone that is affected. You may have: Pain in your neck or arm if a disk in your neck area is affected. Pain in your back, buttocks, or legs if a disk in your lower back is affected. Pain that becomes worse while bending or reaching up, or with twisting movements. Pain that may start gradually and worsen as time passes. It may also start after a major or minor injury. Numbness or tingling in the arms or legs. How is this diagnosed? This condition may be diagnosed based on: Your symptoms  and medical history. A physical exam. Imaging tests, including: X-ray of the spine. CT scan. MRI. How is this treated? This condition may be treated with: Medicines. Injection of steroids into the back. Rehabilitation exercises. These activities aim to strengthen muscles in your back and abdomen to better support your spine. If treatments do not help to relieve your symptoms or you have severe pain, you may need surgery. Follow these instructions at home: Medicines Take over-the-counter and prescription medicines only as told by your health care provider. Ask your health care provider if the medicine prescribed to you: Requires you to avoid driving or using machinery. Can cause constipation. You may need to take these actions to prevent or treat constipation: Drink enough fluid to keep your urine pale yellow. Take over-the-counter or prescription medicines. Eat foods that are high in fiber, such as beans, whole grains, and fresh fruits and vegetables. Limit foods that are high in fat and processed sugars, such as fried or sweet foods. Activity Rest as told by your health care provider. Avoid sitting for a long time without moving. Get up to take short walks every 1-2 hours. This is important to improve blood flow and breathing. Ask for help if you feel weak or unsteady. Return to your normal activities as told by your health care provider. Ask your health care provider what activities are safe for you. Perform relaxation exercises as told by your health care provider. Maintain good posture. Do not lift anything that is heavier than 10 lb (4.5 kg), or the limit that you are told, until your health  care provider says that it is safe. Follow proper lifting and walking techniques as told by your health care provider. Managing pain, stiffness, and swelling     If directed, put ice on the painful area. Icing can help to relieve pain. To do this: Put ice in a plastic bag. Place a towel  between your skin and the bag. Leave the ice on for 20 minutes, 2-3 times a day. Remove the ice if your skin turns bright red. This is very important. If you cannot feel pain, heat, or cold, you have a greater risk of damage to the area. If directed, apply heat to the painful area as often as told by your health care provider. Heat can reduce the stiffness of your muscles. Use the heat source that your health care provider recommends, such as a moist heat pack or a heating pad. Place a towel between your skin and the heat source. Leave the heat on for 20-30 minutes. Remove the heat if your skin turns bright red. This is especially important if you are unable to feel pain, heat, or cold. You may have a greater risk of getting burned. General instructions Change your sitting, standing, and sleeping habits as told by your health care provider. Avoid sitting in the same position for long periods of time. Change positions frequently. Lose weight or maintain a healthy weight as told by your health care provider. Do not use any products that contain nicotine or tobacco, such as cigarettes, e-cigarettes, and chewing tobacco. If you need help quitting, ask your health care provider. Wear supportive footwear. Keep all follow-up visits. This is important. This may include visits for physical therapy. Contact a health care provider if you: Have pain that does not go away within 1-4 weeks. Lose your appetite. Lose weight without trying. Get help right away if you: Have severe pain. Notice weakness in your arms, hands, or legs. Begin to lose control of your bladder or bowel movements. Have fevers or night sweats. Summary Degenerative disk disease is a condition caused by changes that occur in the spinal disks as a person ages. This condition can affect the whole spine. However, the neck and lower back are most often affected. Take over-the-counter and prescription medicines only as told by your health  care provider. This information is not intended to replace advice given to you by your health care provider. Make sure you discuss any questions you have with your health care provider. Document Revised: 03/21/2020 Document Reviewed: 03/21/2020 Elsevier Patient Education  [place]. Hypertension, Adult Hypertension is another name for high blood pressure. High blood pressure forces your heart to work harder to pump blood. This can cause problems over time. There are two numbers in a blood pressure reading. There is a top number (systolic) over a bottom number (diastolic). It is best to have a blood pressure that is below 120/80. What are the causes? The cause of this condition is not known. Some other conditions can lead to high blood pressure. What increases the risk? Some lifestyle factors can make you more likely to develop high blood pressure: Smoking. Not getting enough exercise or physical activity. Being overweight. Having too much fat, sugar, calories, or salt (sodium) in your diet. Drinking too much alcohol. Other risk factors include: Having any of these conditions: Heart disease. Diabetes. High cholesterol. Kidney disease. Obstructive sleep apnea. Having a family history of high blood pressure and high cholesterol. Age. The risk increases with age. Stress. What are the signs  or symptoms? High blood pressure may not cause symptoms. Very high blood pressure (hypertensive crisis) may cause: Headache. Fast or uneven heartbeats (palpitations). Shortness of breath. Nosebleed. Vomiting or feeling like you may vomit (nauseous). Changes in how you see. Very bad chest pain. Feeling dizzy. Seizures. How is this treated? This condition is treated by making healthy lifestyle changes, such as: Eating healthy foods. Exercising more. Drinking less alcohol. Your doctor may prescribe medicine if lifestyle changes do not help enough and if: Your top number is above  130. Your bottom number is above 80. Your personal target blood pressure may vary. Follow these instructions at home: Eating and drinking  If told, follow the DASH eating plan. To follow this plan: Fill one half of your plate at each meal with fruits and vegetables. Fill one fourth of your plate at each meal with whole grains. Whole grains include whole-wheat pasta, brown rice, and whole-grain bread. Eat or drink low-fat dairy products, such as skim milk or low-fat yogurt. Fill one fourth of your plate at each meal with low-fat (lean) proteins. Low-fat proteins include fish, chicken without skin, eggs, beans, and tofu. Avoid fatty meat, cured and processed meat, or chicken with skin. Avoid pre-made or processed food. Limit the amount of salt in your diet to less than 1,500 mg each day. Do not drink alcohol if: Your doctor tells you not to drink. You are pregnant, may be pregnant, or are planning to become pregnant. If you drink alcohol: Limit how much you have to: 0-1 drink a day for women. 0-2 drinks a day for men. Know how much alcohol is in your drink. In the [place] one drink equals one 12 oz bottle of beer (355 mL), one 5 oz glass of wine (148 mL), or one 1 oz glass of hard liquor (44 mL). Lifestyle  Work with your doctor to stay at a healthy weight or to lose weight. Ask your doctor what the best weight is for you. Get at least 30 minutes of exercise that causes your heart to beat faster (aerobic exercise) most days of the week. This may include walking, swimming, or biking. Get at least 30 minutes of exercise that strengthens your muscles (resistance exercise) at least 3 days a week. This may include lifting weights or doing Pilates. Do not smoke or use any products that contain nicotine or tobacco. If you need help quitting, ask your doctor. Check your blood pressure at home as told by your doctor. Keep all follow-up visits. Medicines Take over-the-counter and prescription  medicines only as told by your doctor. Follow directions carefully. Do not skip doses of blood pressure medicine. The medicine does not work as well if you skip doses. Skipping doses also puts you at risk for problems. Ask your doctor about side effects or reactions to medicines that you should watch for. Contact a doctor if: You think you are having a reaction to the medicine you are taking. You have headaches that keep coming back. You feel dizzy. You have swelling in your ankles. You have trouble with your vision. Get help right away if: You get a very bad headache. You start to feel mixed up (confused). You feel weak or numb. You feel faint. You have very bad pain in your: Chest. Belly (abdomen). You vomit more than once. You have trouble breathing. These symptoms may be an emergency. Get help right away. Call 911. Do not wait to see if the symptoms will go away. Do not drive yourself to  the hospital. Summary Hypertension is another name for high blood pressure. High blood pressure forces your heart to work harder to pump blood. For most people, a normal blood pressure is less than 120/80. Making healthy choices can help lower blood pressure. If your blood pressure does not get lower with healthy choices, you may need to take medicine. This information is not intended to replace advice given to you by your health care provider. Make sure you discuss any questions you have with your health care provider. Document Revised: 09/25/2021 Document Reviewed: 09/25/2021 Elsevier Patient Education  [place].

## 2022-10-10 LAB — LIPID PANEL
Chol/HDL Ratio: 4.1 ratio (ref 0.0–4.4)
Cholesterol, Total: 162 mg/dL (ref 100–199)
HDL: 40 mg/dL (ref >39)
LDL Chol Calc (NIH): 89 mg/dL (ref 0–99)
Triglycerides: 192 mg/dL — ABNORMAL HIGH (ref 0–149)
VLDL Cholesterol Cal: 33 mg/dL (ref 5–40)

## 2022-10-14 ENCOUNTER — Other Ambulatory Visit: Payer: Self-pay

## 2022-10-14 ENCOUNTER — Inpatient Hospital Stay: Payer: 59 | Attending: Hematology

## 2022-10-14 ENCOUNTER — Inpatient Hospital Stay: Payer: 59 | Admitting: Hematology

## 2022-10-14 VITALS — BP 143/61 | HR 75 | Temp 98.4°F | Resp 16

## 2022-10-14 DIAGNOSIS — Z7901 Long term (current) use of anticoagulants: Secondary | ICD-10-CM | POA: Diagnosis not present

## 2022-10-14 DIAGNOSIS — D45 Polycythemia vera: Secondary | ICD-10-CM

## 2022-10-14 DIAGNOSIS — Z86718 Personal history of other venous thrombosis and embolism: Secondary | ICD-10-CM | POA: Diagnosis not present

## 2022-10-14 DIAGNOSIS — D508 Other iron deficiency anemias: Secondary | ICD-10-CM | POA: Insufficient documentation

## 2022-10-14 LAB — CBC WITH DIFFERENTIAL (CANCER CENTER ONLY)
Abs Immature Granulocytes: 0.14 10*3/uL — ABNORMAL HIGH (ref 0.00–0.07)
Basophils Absolute: 0.2 10*3/uL — ABNORMAL HIGH (ref 0.0–0.1)
Basophils Relative: 1 %
Eosinophils Absolute: 0.4 10*3/uL (ref 0.0–0.5)
Eosinophils Relative: 3 %
HCT: 36.7 % (ref 36.0–46.0)
Hemoglobin: 10.5 g/dL — ABNORMAL LOW (ref 12.0–15.0)
Immature Granulocytes: 1 %
Lymphocytes Relative: 9 %
Lymphs Abs: 1.4 10*3/uL (ref 0.7–4.0)
MCH: 23.5 pg — ABNORMAL LOW (ref 26.0–34.0)
MCHC: 28.6 g/dL — ABNORMAL LOW (ref 30.0–36.0)
MCV: 82.3 fL (ref 80.0–100.0)
Monocytes Absolute: 0.4 10*3/uL (ref 0.1–1.0)
Monocytes Relative: 2 %
Neutro Abs: 12.6 10*3/uL — ABNORMAL HIGH (ref 1.7–7.7)
Neutrophils Relative %: 84 %
Platelet Count: 462 10*3/uL — ABNORMAL HIGH (ref 150–400)
RBC: 4.46 MIL/uL (ref 3.87–5.11)
RDW: 21.1 % — ABNORMAL HIGH (ref 11.5–15.5)
WBC Count: 15 10*3/uL — ABNORMAL HIGH (ref 4.0–10.5)
nRBC: 0 % (ref 0.0–0.2)

## 2022-10-14 LAB — FERRITIN: Ferritin: 17 ng/mL (ref 11–307)

## 2022-10-14 NOTE — Patient Instructions (Signed)

## 2022-10-14 NOTE — Progress Notes (Signed)
No parameters on patient's phlebotomy- discussed labs with Dr. Burr Medico- Hemoglobin of 10.5/ hematocrit of 36.7 -ok for treatment today.  Therapeutic phlebotomy was done per order- An 18g needle was placed in her L AC and at 1426 phlebotomy was started. Blood flowed well until 1436 when the patient accidentally dislodged the needle- 455g were already removed and patient decided that was enough- no second stick. VSS- BP (!) 143/61 (BP Location: Right Arm, Patient Position: Sitting)   Pulse 75   Temp 98.4 F (36.9 C) (Oral)   Resp 16   SpO2 98%  Patient was offered food and fluid- drank well and stayed for her 30 minute observation. Not symptomatic for any SOB, dizziness or other issues.

## 2022-10-29 ENCOUNTER — Other Ambulatory Visit: Payer: Self-pay | Admitting: Nurse Practitioner

## 2022-10-29 DIAGNOSIS — E782 Mixed hyperlipidemia: Secondary | ICD-10-CM

## 2022-10-29 DIAGNOSIS — I482 Chronic atrial fibrillation, unspecified: Secondary | ICD-10-CM

## 2022-10-29 DIAGNOSIS — M1712 Unilateral primary osteoarthritis, left knee: Secondary | ICD-10-CM

## 2022-11-03 ENCOUNTER — Other Ambulatory Visit: Payer: Self-pay | Admitting: Nurse Practitioner

## 2022-11-25 ENCOUNTER — Ambulatory Visit (HOSPITAL_COMMUNITY)
Admission: RE | Admit: 2022-11-25 | Discharge: 2022-11-25 | Disposition: A | Discharge status: Home / Self Care | Payer: 59 | Source: Ambulatory Visit | Attending: Cardiovascular Disease | Admitting: Cardiovascular Disease

## 2022-11-25 DIAGNOSIS — I739 Peripheral vascular disease, unspecified: Secondary | ICD-10-CM | POA: Diagnosis not present

## 2022-11-25 DIAGNOSIS — I6523 Occlusion and stenosis of bilateral carotid arteries: Secondary | ICD-10-CM | POA: Diagnosis not present

## 2022-11-25 DIAGNOSIS — I6529 Occlusion and stenosis of unspecified carotid artery: Secondary | ICD-10-CM | POA: Diagnosis present

## 2022-11-27 ENCOUNTER — Encounter (HOSPITAL_COMMUNITY): Payer: Self-pay

## 2022-11-27 ENCOUNTER — Other Ambulatory Visit: Payer: Self-pay

## 2022-11-27 ENCOUNTER — Observation Stay (HOSPITAL_COMMUNITY)
Admission: EM | Admit: 2022-11-27 | Discharge: 2022-11-29 | Disposition: A | Discharge status: Home / Self Care | Payer: 59 | Attending: Internal Medicine | Admitting: Internal Medicine

## 2022-11-27 ENCOUNTER — Ambulatory Visit (INDEPENDENT_AMBULATORY_CARE_PROVIDER_SITE_OTHER): Payer: 59

## 2022-11-27 ENCOUNTER — Emergency Department (HOSPITAL_COMMUNITY): Payer: 59

## 2022-11-27 VITALS — Ht 64.0 in | Wt 136.0 lb

## 2022-11-27 DIAGNOSIS — J811 Chronic pulmonary edema: Secondary | ICD-10-CM | POA: Insufficient documentation

## 2022-11-27 DIAGNOSIS — R7989 Other specified abnormal findings of blood chemistry: Secondary | ICD-10-CM | POA: Diagnosis not present

## 2022-11-27 DIAGNOSIS — Z79899 Other long term (current) drug therapy: Secondary | ICD-10-CM | POA: Diagnosis not present

## 2022-11-27 DIAGNOSIS — Z89611 Acquired absence of right leg above knee: Secondary | ICD-10-CM | POA: Diagnosis not present

## 2022-11-27 DIAGNOSIS — Z0000 Encounter for general adult medical examination without abnormal findings: Secondary | ICD-10-CM

## 2022-11-27 DIAGNOSIS — Z7982 Long term (current) use of aspirin: Secondary | ICD-10-CM | POA: Insufficient documentation

## 2022-11-27 DIAGNOSIS — E785 Hyperlipidemia, unspecified: Secondary | ICD-10-CM | POA: Diagnosis present

## 2022-11-27 DIAGNOSIS — Z8673 Personal history of transient ischemic attack (TIA), and cerebral infarction without residual deficits: Secondary | ICD-10-CM | POA: Diagnosis not present

## 2022-11-27 DIAGNOSIS — I2510 Atherosclerotic heart disease of native coronary artery without angina pectoris: Secondary | ICD-10-CM | POA: Insufficient documentation

## 2022-11-27 DIAGNOSIS — I161 Hypertensive emergency: Secondary | ICD-10-CM | POA: Diagnosis not present

## 2022-11-27 DIAGNOSIS — I480 Paroxysmal atrial fibrillation: Secondary | ICD-10-CM | POA: Diagnosis not present

## 2022-11-27 DIAGNOSIS — D72829 Elevated white blood cell count, unspecified: Secondary | ICD-10-CM | POA: Diagnosis present

## 2022-11-27 DIAGNOSIS — Z87891 Personal history of nicotine dependence: Secondary | ICD-10-CM | POA: Insufficient documentation

## 2022-11-27 DIAGNOSIS — Z7901 Long term (current) use of anticoagulants: Secondary | ICD-10-CM | POA: Insufficient documentation

## 2022-11-27 DIAGNOSIS — E782 Mixed hyperlipidemia: Secondary | ICD-10-CM | POA: Diagnosis present

## 2022-11-27 DIAGNOSIS — F411 Generalized anxiety disorder: Secondary | ICD-10-CM | POA: Diagnosis present

## 2022-11-27 DIAGNOSIS — I10 Essential (primary) hypertension: Secondary | ICD-10-CM | POA: Insufficient documentation

## 2022-11-27 DIAGNOSIS — R0789 Other chest pain: Secondary | ICD-10-CM | POA: Diagnosis present

## 2022-11-27 DIAGNOSIS — I739 Peripheral vascular disease, unspecified: Secondary | ICD-10-CM | POA: Diagnosis present

## 2022-11-27 LAB — BASIC METABOLIC PANEL
Anion gap: 9 (ref 5–15)
BUN: 31 mg/dL — ABNORMAL HIGH (ref 8–23)
CO2: 21 mmol/L — ABNORMAL LOW (ref 22–32)
Calcium: 8.7 mg/dL — ABNORMAL LOW (ref 8.9–10.3)
Chloride: 109 mmol/L (ref 98–111)
Creatinine, Ser: 1.1 mg/dL — ABNORMAL HIGH (ref 0.44–1.00)
GFR, Estimated: 54 mL/min — ABNORMAL LOW (ref >60)
Glucose, Bld: 94 mg/dL (ref 70–99)
Potassium: 5 mmol/L (ref 3.5–5.1)
Sodium: 139 mmol/L (ref 135–145)

## 2022-11-27 LAB — CBC
HCT: 32 % — ABNORMAL LOW (ref 36.0–46.0)
Hemoglobin: 9.1 g/dL — ABNORMAL LOW (ref 12.0–15.0)
MCH: 23.5 pg — ABNORMAL LOW (ref 26.0–34.0)
MCHC: 28.4 g/dL — ABNORMAL LOW (ref 30.0–36.0)
MCV: 82.7 fL (ref 80.0–100.0)
Platelets: 472 10*3/uL — ABNORMAL HIGH (ref 150–400)
RBC: 3.87 MIL/uL (ref 3.87–5.11)
RDW: 18.6 % — ABNORMAL HIGH (ref 11.5–15.5)
WBC: 15 10*3/uL — ABNORMAL HIGH (ref 4.0–10.5)
nRBC: 0.1 % (ref 0.0–0.2)

## 2022-11-27 LAB — TROPONIN I (HIGH SENSITIVITY): Troponin I (High Sensitivity): 41 ng/L — ABNORMAL HIGH (ref <18)

## 2022-11-27 NOTE — ED Notes (Signed)
Pt denies chest pain at this time- reports pain stopped as she was on phone calling EMS, but they recommended she come in, and that is what she did

## 2022-11-27 NOTE — ED Triage Notes (Signed)
Pt BIB [facility] for CP. Pt took 325 ASA before EMS arrived and pt is pain free at this time. Pt states the pain came on at rest and describes the pain as "being punched." Pain radiated to L shoulder.   EMS Vitals   231/91 HR 98 and a-fib on 12-lead RR 18

## 2022-11-27 NOTE — ED Provider Notes (Signed)
[facility] EMERGENCY DEPARTMENT Provider Note   CSN: [id number] Arrival date & time: 11/27/22  2040     History {Add pertinent medical, surgical, social history, OB history to HPI:1} Chief Complaint  Patient presents with   Chest Pain    [name] is a 71 y.o. female.  The history is provided by the patient.  Patient with extensive medical history presents with chest pain.  Patient reports over the past 3 days she has had episodes of chest pain in the left chest that lasted about an hour.  She becomes short of breath with the symptoms.  She has had multiple episodes per day.  She reports earlier tonight she started having a severe episode in her left chest into her left arm.  It did not radiate to the back.  She reports feeling diaphoretic.  No new arm or leg weakness.  No pleuritic pain.  She feels like she is being poked in her chest She reports previous history of CAD. Patient reports she takes Eliquis, but did not take this evening's dose    Home Medications Prior to Admission medications   Medication Sig Start Date End Date Taking? Authorizing Provider  acetaminophen (TYLENOL) 500 MG tablet Take 2 tablets (1,000 mg total) by mouth every 6 (six) hours. 09/16/18   Norm Parcel, PA-C  ARTIFICIAL TEAR OP Place 1 drop into both eyes as needed (for dry eyes).    [provider]  aspirin EC 325 MG tablet Take 325 mg by mouth daily.    [provider]  collagenase (SANTYL) 250 UNIT/GM ointment     [provider]  diclofenac Sodium (VOLTAREN) 1 % GEL Apply 2 g topically 4 (four) times daily. 09/03/21   Ivy Lynn, NP  diltiazem (CARDIZEM CD) 240 MG 24 hr capsule TAKE 1 CAPSULE EVERY MORNING Patient taking differently: Take 240 mg by mouth every morning. 08/20/22   Ivy Lynn, NP  ELIQUIS 5 MG TABS tablet TAKE ONE TABLET TWICE DAILY 10/29/22   Ivy Lynn, NP  escitalopram (LEXAPRO) 20 MG tablet TAKE ONE TABLET ONCE DAILY 11/03/22    Ivy Lynn, NP  ezetimibe (ZETIA) 10 MG tablet Take 1 tablet (10 mg total) by mouth daily. 02/16/22   Ivy Lynn, NP  fenofibrate (TRICOR) 145 MG tablet TAKE ONE TABLET ONCE DAILY 10/29/22   Ivy Lynn, NP  flecainide (TAMBOCOR) 50 MG tablet TAKE ONE TABLET TWICE DAILY 10/29/22   Ivy Lynn, NP  hydroxyurea (HYDREA) 500 MG capsule TAKE 1 CAPSULE WITH FOOD EVERY DAY EXCEPT SUNDAYS 09/30/22   Truitt Merle, MD  lisinopril (ZESTRIL) 20 MG tablet     [provider]  mupirocin cream (BACTROBAN) 2 % Apply 1 application topically 2 (two) times daily. 02/16/22   Ivy Lynn, NP  omeprazole (PRILOSEC) 20 MG capsule TAKE ONE CAPSULE TWICE DAILY BEFORE MEALS 10/06/22   Ivy Lynn, NP  traMADol (ULTRAM) 50 MG tablet Take 1 tablet (50 mg total) by mouth daily as needed for moderate pain. 11/09/22 12/09/22  Ivy Lynn, NP  traMADol (ULTRAM) 50 MG tablet Take 1 tablet (50 mg total) by mouth daily as needed for moderate pain. 12/09/22 01/08/23  Ivy Lynn, NP  valsartan (DIOVAN) 160 MG tablet Take 160 mg every afternoon 12/23/21   Lorretta Harp, MD  valsartan (DIOVAN) 320 MG tablet Take 320 mg every morning 12/23/21   Lorretta Harp, MD      Allergies  Atenolol, Demerol  [meperidine hcl], Demerol [meperidine], Gabapentin, Inderal [propranolol], Prednisone, Statins, Warfarin and related, Crestor [rosuvastatin], Docosahexaenoic acid (dha), Lactose intolerance (gi), Lipitor [atorvastatin], Lovaza [omega-3-acid ethyl esters], Penicillins, Pravastatin, Simvastatin, Trilipix [choline fenofibrate], Warfarin, Hydralazine, Effexor [venlafaxine hcl], Nexium [esomeprazole magnesium], Percocet [oxycodone-acetaminophen], and Plavix [clopidogrel bisulfate]    Review of Systems   Review of Systems  Constitutional:  Positive for diaphoresis. Negative for fever.  Respiratory:  Positive for shortness of breath.   Cardiovascular:  Positive for chest pain.  Gastrointestinal:   Negative for vomiting.    Physical Exam Updated Vital Signs BP (!) 146/92   Pulse 89   Temp 98.4 F (36.9 C) (Oral)   Resp 17   Ht 1.626 m ('5\' 4"'$ )   Wt 61.7 kg   SpO2 95%   BMI 23.34 kg/m  Physical Exam CONSTITUTIONAL: Chronically ill-appearing, no acute distress HEAD: Normocephalic/atraumatic EYES: EOMI ENMT: Mucous membranes moist NECK: supple no meningeal signs SPINE/BACK:entire spine nontender CV: S1/S2 noted, no murmurs/rubs/gallops noted LUNGS: Lungs are clear to auscultation bilaterally, no apparent distress Chest-no bruising or tenderness to her chest ABDOMEN: soft, nontender NEURO: Pt is awake/alert/appropriate, moves all extremitiesx4.  No facial droop.   EXTREMITIES: pulses normal/equal, right AKA noted SKIN: warm, color normal PSYCH: no abnormalities of mood noted, alert and oriented to situation  ED Results / Procedures / Treatments   Labs (all labs ordered are listed, but only abnormal results are displayed) Labs Reviewed  BASIC METABOLIC PANEL - Abnormal; Notable for the following components:      Result Value   CO2 21 (*)    BUN 31 (*)    Creatinine, Ser 1.10 (*)    Calcium 8.7 (*)    GFR, Estimated 54 (*)    All other components within normal limits  CBC - Abnormal; Notable for the following components:   WBC 15.0 (*)    Hemoglobin 9.1 (*)    HCT 32.0 (*)    MCH 23.5 (*)    MCHC 28.4 (*)    RDW 18.6 (*)    Platelets 472 (*)    All other components within normal limits  TROPONIN I (HIGH SENSITIVITY) - Abnormal; Notable for the following components:   Troponin I (High Sensitivity) 41 (*)    All other components within normal limits  APTT  PROTIME-INR  TROPONIN I (HIGH SENSITIVITY)    EKG EKG Interpretation  Date/Time:  Friday November 27 2022 23:21:27 EST Ventricular Rate:  85 PR Interval:  127 QRS Duration: 85 QT Interval:  380 QTC Calculation: 452 R Axis:   39 Text Interpretation: Sinus rhythm Left ventricular hypertrophy No  significant change since last tracing Confirmed by Ripley Fraise [phone]) on 11/27/2022 11:34:17 PM  Radiology DG Chest 2 View  Result Date: 11/27/2022 CLINICAL DATA:  Chest pain. EXAM: CHEST - 2 VIEW COMPARISON:  09/10/2018. FINDINGS: Heart is mildly enlarged and the mediastinal contour is within normal limits. There is atherosclerotic calcification of the aorta. The pulmonary vasculature is distended. Interstitial prominence is present bilaterally. No consolidation, effusion, or pneumothorax. No acute fracture is seen. IMPRESSION: 1. Cardiomegaly with pulmonary vascular congestion. 2. Interstitial prominence bilaterally, possible edema or infiltrate. Electronically Signed   By: Brett Fairy M.D.   On: 11/27/2022 21:39    Procedures .Critical Care  Performed by: Ripley Fraise, MD Authorized by: Ripley Fraise, MD   Critical care provider statement:    Critical care start time:  11/27/2022 11:57 PM   Critical care end time:  11/27/2022 11:57 PM  Critical care time was exclusive of:  Separately billable procedures and treating other patients   Critical care was necessary to treat or prevent imminent or life-threatening deterioration of the following conditions:  Cardiac failure and circulatory failure   Critical care was time spent personally by me on the following activities:  Development of treatment plan with patient or surrogate, evaluation of patient's response to treatment, examination of patient, ordering and review of radiographic studies, ordering and review of laboratory studies, pulse oximetry, re-evaluation of patient's condition and ordering and performing treatments and interventions   I assumed direction of critical care for this patient from another provider in my specialty: no     Care discussed with: admitting provider     {Document cardiac monitor, telemetry assessment procedure when appropriate:1}  Medications Ordered in ED Medications - No data to display  ED  Course/ Medical Decision Making/ A&P Clinical Course as of 11/27/22 2357  Fri Nov 27, 2022  2356 Patient reports intermittent chest pain episodes of the past several days, but she is now chest pain-free.  She is already taken aspirin.  She has missed her evening dose of Eliquis.  Her blood pressure is coming down spontaneously.  First troponin was mildly elevated.  Will get repeat troponin, anticipate admission. [DW]    Clinical Course User Index [DW] Ripley Fraise, MD                           Medical Decision Making Amount and/or Complexity of Data Reviewed Labs: ordered. Radiology: ordered.   This patient presents to the ED for concern of chest pain, this involves an extensive number of treatment options, and is a complaint that carries with it a high risk of complications and morbidity.  The differential diagnosis includes but is not limited to acute coronary syndrome, aortic dissection, pulmonary embolism, pericarditis, pneumothorax, pneumonia, myocarditis, pleurisy, esophageal rupture    Comorbidities that complicate the patient evaluation: Patient's presentation is complicated by their history of coronary artery disease, peripheral vascular disease  Social Determinants of Health: Patient's  limitation of mobility   increases the complexity of managing their presentation  Additional history obtained: Records reviewed  cardiology notes reviewed  Lab Tests: I Ordered, and personally interpreted labs.  The pertinent results include: Leukocytosis, elevated troponin  Imaging Studies ordered: I ordered imaging studies including X-ray chest   I independently visualized and interpreted imaging which showed pulmonary edema I agree with the radiologist interpretation  Cardiac Monitoring: The patient was maintained on a cardiac monitor.  I personally viewed and interpreted the cardiac monitor which showed an underlying rhythm of:  sinus rhythm  Medicines ordered and prescription  drug management: I ordered medication including ***  for ***  Reevaluation of the patient after these medicines showed that the patient    {resolved/improved/worsened:23923::"improved"}  Test Considered: Patient is low risk / negative by ***, therefore do not feel that *** is indicated.  Critical Interventions:  ***  Consultations Obtained: I requested consultation with the {consultation:26851}, and discussed  findings as well as pertinent plan - they recommend: ***  Reevaluation: After the interventions noted above, I reevaluated the patient and found that they have :{resolved/improved/worsened:23923::"improved"}  Complexity of problems addressed: Patient's presentation is most consistent with  {QMGQ:67619}  Disposition: After consideration of the diagnostic results and the patient's response to treatment,  I feel that the patent would benefit from {disposition:26850}.     {Document critical care time when appropriate:1} {Document  review of labs and clinical decision tools ie heart score, Chads2Vasc2 etc:1}  {Document your independent review of radiology images, and any outside records:1} {Document your discussion with family members, caretakers, and with consultants:1} {Document social determinants of health affecting pt's care:1} {Document your decision making why or why not admission, treatments were needed:1} Final Clinical Impression(s) / ED Diagnoses Final diagnoses:  None    Rx / DC Orders ED Discharge Orders     None

## 2022-11-27 NOTE — Progress Notes (Signed)
Subjective:   [name] is a 71 y.o. female who presents for Medicare Annual (Subsequent) preventive examination. I connected with  [name] on 11/27/22 by a audio enabled telemedicine application and verified that I am speaking with the correct person using two identifiers.  Patient Location: Home  Provider Location: Home Office  I discussed the limitations of evaluation and management by telemedicine. The patient expressed understanding and agreed to proceed.  Review of Systems     Cardiac Risk Factors include: advanced age (>73mn, >>15women);hypertension     Objective:    Today's Vitals   11/27/22 1407  Weight: 136 lb (61.7 kg)  Height: '5\' 4"'$  (1.626 m)   Body mass index is 23.34 kg/m.     11/27/2022    2:11 PM 08/26/2022   11:08 AM 07/14/2022    7:21 PM 11/04/2021    2:28 AM 11/03/2021    8:47 PM 10/27/2021    1:24 PM 10/06/2021    6:49 PM  Advanced Directives  Does Patient Have a Medical Advance Directive? No No No No No No No  Would patient like information on creating a medical advance directive? No - Patient declined   No - Patient declined No - Patient declined No - Patient declined No - Patient declined    Current Medications (verified) Outpatient Encounter Medications as of 11/27/2022  Medication Sig   acetaminophen (TYLENOL) 500 MG tablet Take 2 tablets (1,000 mg total) by mouth every 6 (six) hours.   ARTIFICIAL TEAR OP Place 1 drop into both eyes as needed (for dry eyes).   aspirin EC 325 MG tablet Take 325 mg by mouth daily.   collagenase (SANTYL) 250 UNIT/GM ointment    diclofenac Sodium (VOLTAREN) 1 % GEL Apply 2 g topically 4 (four) times daily.   diltiazem (CARDIZEM CD) 240 MG 24 hr capsule TAKE 1 CAPSULE EVERY MORNING   ELIQUIS 5 MG TABS tablet TAKE ONE TABLET TWICE DAILY   escitalopram (LEXAPRO) 20 MG tablet TAKE ONE TABLET ONCE DAILY   ezetimibe (ZETIA) 10 MG tablet Take 1 tablet (10 mg total) by mouth daily.   fenofibrate (TRICOR) 145  MG tablet TAKE ONE TABLET ONCE DAILY   flecainide (TAMBOCOR) 50 MG tablet TAKE ONE TABLET TWICE DAILY   hydroxyurea (HYDREA) 500 MG capsule TAKE 1 CAPSULE WITH FOOD EVERY DAY EXCEPT SUNDAYS   lisinopril (ZESTRIL) 20 MG tablet    mupirocin cream (BACTROBAN) 2 % Apply 1 application topically 2 (two) times daily.   omeprazole (PRILOSEC) 20 MG capsule TAKE ONE CAPSULE TWICE DAILY BEFORE MEALS   traMADol (ULTRAM) 50 MG tablet Take 1 tablet (50 mg total) by mouth daily as needed for moderate pain.   [START ON 12/09/2022] traMADol (ULTRAM) 50 MG tablet Take 1 tablet (50 mg total) by mouth daily as needed for moderate pain.   valsartan (DIOVAN) 160 MG tablet Take 160 mg every afternoon   valsartan (DIOVAN) 320 MG tablet Take 320 mg every morning   No facility-administered encounter medications on file as of 11/27/2022.    Allergies (verified) Atenolol, Demerol  [meperidine hcl], Demerol [meperidine], Gabapentin, Inderal [propranolol], Prednisone, Statins, Warfarin and related, Crestor [rosuvastatin], Docosahexaenoic acid (dha), Lactose intolerance (gi), Lipitor [atorvastatin], Lovaza [omega-3-acid ethyl esters], Penicillins, Pravastatin, Simvastatin, Trilipix [choline fenofibrate], Warfarin, Hydralazine, Effexor [venlafaxine hcl], Nexium [esomeprazole magnesium], Percocet [oxycodone-acetaminophen], and Plavix [clopidogrel bisulfate]   History: Past Medical History:  Diagnosis Date   Arthritis    "fingers, some in my knees" (01/28/2016)  CAD (coronary artery disease), per cath 04/27/13, non obstructive disease, EF 65-70% 05/11/2013   Carotid disease, bilateral ([place]) 09/20/2013   Lt carotid 50-69% stentosis, less on rt   Dysrhythmia    Fatty liver    GERD (gastroesophageal reflux disease)    Glaucoma    H/O cardiovascular stress test 12/27/2009   negative for ischemia   Headache    "occasional since eye pressure regulated" (01/28/2016)   Hyperlipidemia    Hypertension    Leukocytosis 07/15/2015    Migraine    "stopped w/laser holes to relieve pressure in my eyes; had them 3-4 times/wk before taht" (01/28/2016)   Mitral regurgitation,2+ by cath 05/11/2013   NSTEMI (non-ST elevated myocardial infarction) ([place]) 04/27/2013   Pain    BOTH KNEES - PT HAS TORN MENISCUS LEFT KNEE   Paroxysmal atrial fibrillation (HCC)    hx of a fib on ASA  AND ELIQUIS    Polycythemia vera ([place]) 08/20/2015   JAK-2 positive 07/19/15   PVD (peripheral vascular disease) with claudication ([place]) 07/21/2006   previous L ext iliac stent and rt SFA stent, known occluded Lt SFA   S/P cardiac cath 04/27/2013   NON OBSTRUCTIVE DISEASE, 2+ mr, ef 65-70%   Statin intolerance    Stroke ([place]) 12/21/2004   SWELLING ALL OVER AND RT SIDE OF FACE DRAWN AND SPEECH SLURRED AND NUMBNESS ON RIGHT SIDE-- ALL RESOLVED   TIA (transient ischemic attack)    Past Surgical History:  Procedure Laterality Date   AMPUTATION Right 05/21/2017   Procedure: AMPUTATION ABOVE KNEE;  Surgeon: Rosetta Posner, MD;  Location: [facility];  Service: Vascular;  Laterality: Right;   CARDIAC CATHETERIZATION  04/27/13   non occlusive disease with mild to mod. calcified lesions in ostial LM and proximal LAD and moderate prox. RC AND DISTAL AV GROOVE LCX, ef   CATARACT EXTRACTION W/PHACO Right 03/21/2015   Procedure: CATARACT EXTRACTION PHACO AND INTRAOCULAR LENS PLACEMENT RIGHT EYE;  Surgeon: Baruch Goldmann, MD;  Location: [facility];  Service: Ophthalmology;  Laterality: Right;  CDE:5.10   CATARACT EXTRACTION W/PHACO Left 05/16/2015   Procedure: CATARACT EXTRACTION PHACO AND INTRAOCULAR LENS PLACEMENT (IOC);  Surgeon: Baruch Goldmann, MD;  Location: [facility];  Service: Ophthalmology;  Laterality: Left;  CDE 5.57   CESAREAN SECTION  1977   COLONOSCOPY N/A 09/11/2016   Procedure: COLONOSCOPY;  Surgeon: Rogene Houston, MD;  [facility];  Service: Endoscopy;  Laterality: N/A;  730-moved to 1:00 Ann notified pt   EMBOLECTOMY Right 02/01/2016   Procedure: Thrombectomy  of Right Femoral-Popliteal Bypass Graft; Endarterectomy of Right Below Knee Popliteal Artery and Tibial Peroneal Trunk with Bovine Pericardium Patch Angioplasty.;  Surgeon: Angelia Mould, MD;  Location: [facility];  Service: Vascular;  Laterality: Right;   FEMORAL-POPLITEAL BYPASS GRAFT Right 01/31/2016   Procedure: BYPASS GRAFT RIGHT COMMON  FEMORALTO BELOW KNEE POPLITEAL ARTERY BYPASS GRAFT USING 6MM PROPATEN GORTEX GRAFT;  Surgeon: Mal Misty, MD;  Location: [facility];  Service: Vascular;  Laterality: Right;   FEMORAL-POPLITEAL BYPASS GRAFT Right 01/09/2017   Procedure: THROMBECTOMY OF RIGHT FEMORAL-POPLITEAL  ARTERY BYPASS GRAFT; THROMBECTOMY RIGHT  TIBIAL VESSELS;  Surgeon: [name], MD;  Location: [facility];  Service: Vascular;  Laterality: Right;   FEMORAL-POPLITEAL BYPASS GRAFT Right 05/17/2017   Procedure: RIGHT FEMORAL-TIBIAL PERONEAL TRUNK ARTERY BYPASS GRAFT USING 19mX80cm PROPATEN GRAFT WITH REMOVABLE RING;  Surgeon: ERosetta Posner MD;  [facility];  Service: Vascular;  Laterality: Right;   FRACTURE SURGERY  ILIAC ARTERY STENT  07/2006   external iliac and Rt SFA stent 07/2006 AND THE LEFT WAS IN 2006   KNEE ARTHROSCOPY WITH MEDIAL MENISECTOMY Left 03/27/2014   Procedure: left knee arthorscopy with medial chondraplasty of the medial femoral and patella, medial microfracture technique of medial femoral condyl;  Surgeon: Tobi Bastos, MD;  Location: [facility];  Service: Orthopedics;  Laterality: Left;   LEFT HEART CATHETERIZATION WITH CORONARY ANGIOGRAM Right 04/27/2013   Procedure: LEFT HEART CATHETERIZATION WITH CORONARY ANGIOGRAM;  Surgeon: Leonie Man, MD;  Location: [facility] CATH [facility];  Service: Cardiovascular;  Laterality: Right;   LOWER EXTREMITY ANGIOGRAM Right 01/31/2016   Procedure: INTRAOP RIGHT LOWER EXTREMITY ANGIOGRAM;  Surgeon: Mal Misty, MD;  Location: [facility];  Service: Vascular;  Laterality: Right;   ORIF WRIST FRACTURE Right 1983   OVARIAN CYST REMOVAL Right     PATCH ANGIOPLASTY Right 01/31/2016   Procedure: RIGHT COMMON FEMORAL AND PROFUNDA FEMORIS ENDARECTOMY WITH PATCH ANGIOPLASTY;  Surgeon: Mal Misty, MD;  Location: [facility];  Service: Vascular;  Laterality: Right;   PATCH ANGIOPLASTY Right 01/09/2017   Procedure: PATCH ANGIOPLASTY RIGHT POPLITEAL ARTERY BYPASS GRAFT;  Surgeon: [name], MD;  Location: [facility];  Service: Vascular;  Laterality: Right;   PERIPHERAL VASCULAR CATHETERIZATION N/A 06/27/2015   Procedure: Lower Extremity Angiography;  Surgeon: Lorretta Harp, MD;  Location: [facility];  Service: Cardiovascular;  Laterality: N/A;   PERIPHERAL VASCULAR CATHETERIZATION Bilateral 01/29/2016   Procedure: Lower Extremity Angiography;  Surgeon: Wellington Hampshire, MD;  Location: [facility];  Service: Cardiovascular;  Laterality: Bilateral;   PERIPHERAL VASCULAR CATHETERIZATION N/A 01/29/2016   Procedure: Abdominal Aortogram;  Surgeon: Wellington Hampshire, MD;  Location: [facility];  Service: Cardiovascular;  Laterality: N/A;   REFRACTIVE SURGERY Bilateral    "6 in one eye; 7 in the other; to relieve pressure; not glaucoma" (01/28/2016)   SFA Right 06/27/2015   overlapping Bahn covered stents   Devol Left 05/17/2017   Procedure: LEFT LEG GREATER SAPHENOUS VEIN HARVEST;  Surgeon: Rosetta Posner, MD;  [facility];  Service: Vascular;  Laterality: Left;   VEIN REPAIR Right 01/31/2016   Procedure: RIGHT GREATER SAPHENOUS VEIN EXAMNED BUT NOT REMOVED;  Surgeon: Mal Misty, MD;  Location: [facility];  Service: Vascular;  Laterality: Right;   Family History  Problem Relation Age of Onset   CAD Mother    Lung cancer Mother    Heart attack Mother    CAD Father    Heart disease Father    Heart attack Father 82       died in hes 97's with heart disease   Healthy Sister    Liver cancer Brother    Healthy Sister    CAD Brother        has had CABG   CAD Brother    Social History   Socioeconomic  History   Marital status: Widowed    Spouse name: Not on file   Number of children: 2   Years of education: 25   Highest education level: Not on file  Occupational History   Occupation: CNA  Tobacco Use   Smoking status: Former    Packs/day: 1.00    Years: 45.00    Total pack years: 45.00    Types: Cigarettes    Quit date: 11/11/2012    Years since quitting: 10.0   Smokeless tobacco: Never  Tobacco comments:    quit 4 years ago.    10/27/21-Pt declines lung ca screen at this time.   Vaping Use   Vaping Use: Never used  Substance and Sexual Activity   Alcohol use: No    Alcohol/week: 0.0 standard drinks of alcohol   Drug use: No   Sexual activity: Not Currently    Birth control/protection: None  Other Topics Concern   Not on file  Social History Narrative   Husband passed away after CVA in March 10, 2005.   2 children, 5 grandchildren and 1 great grandchild.   Social Determinants of Health   Financial Resource Strain: Low Risk  (11/27/2022)   Overall Financial Resource Strain (CARDIA)    Difficulty of Paying Living Expenses: Not hard at all  Food Insecurity: No Food Insecurity (11/27/2022)   Hunger Vital Sign    Worried About Running Out of Food in the Last Year: Never true    Ran Out of Food in the Last Year: Never true  Transportation Needs: No Transportation Needs (11/27/2022)   PRAPARE - Hydrologist (Medical): No    Lack of Transportation (Non-Medical): No  Physical Activity: Insufficiently Active (11/27/2022)   Exercise Vital Sign    Days of Exercise per Week: 3 days    Minutes of Exercise per Session: 30 min  Stress: No Stress Concern Present (11/27/2022)   [place]    Feeling of Stress : Not at all  Social Connections: Socially Isolated (11/27/2022)   Social Connection and Isolation Panel [NHANES]    Frequency of Communication with Friends and Family: More than three times a  week    Frequency of Social Gatherings with Friends and Family: More than three times a week    Attends Religious Services: Never    Marine scientist or Organizations: No    Attends Archivist Meetings: Never    Marital Status: Widowed    Tobacco Counseling Counseling given: Not Answered Tobacco comments: quit 4 years ago. 10/27/21-Pt declines lung ca screen at this time.    Clinical Intake:  Pre-visit preparation completed: Yes  Pain : No/denies pain     Nutritional Risks: None Diabetes: No  How often do you need to have someone help you when you read instructions, pamphlets, or other written materials from your doctor or pharmacy?: 1 - Never  Diabetic?no  Interpreter Needed?: No  Information entered by :: Jadene Pierini, LPN   Activities of Daily Living    11/27/2022    2:12 PM 11/24/2022    4:23 PM  In your present state of health, do you have any difficulty performing the following activities:  Hearing? 0 0  Vision? 0 0  Difficulty concentrating or making decisions? 0   Walking or climbing stairs? 0 1  Dressing or bathing? 0 0  Doing errands, shopping? 0 0  Preparing Food and eating ? N N  Using the Toilet? N N  In the past six months, have you accidently leaked urine? N N  Do you have problems with loss of bowel control? N N  Managing your Medications? N   Managing your Finances? N N  Housekeeping or managing your Housekeeping? N N    Patient Care Team: Ivy Lynn, NP as PCP - General (Nurse Practitioner) Lorretta Harp, MD as PCP - Cardiology (Cardiology) Annia Belt, MD as Consulting Physician (Oncology) [name], MD (Inactive) as Consulting Physician (  Vascular Surgery)  Indicate any recent Medical Services you may have received from other than [facility] providers in the past year (date may be approximate).     Assessment:   This is a routine wellness examination for [place]ara.  Hearing/Vision screen Vision  Screening - Comments:: Wears rx glasses - up to date with routine eye exams with  Dr.Johnson   Dietary issues and exercise activities discussed: Current Exercise Habits: Home exercise routine, Type of exercise: walking, Time (Minutes): 30, Frequency (Times/Week): 3, Weekly Exercise (Minutes/Week): 90, Intensity: Mild, Exercise limited by: orthopedic condition(s)   Goals Addressed             This Visit's Progress    Exercise 3x per week (30 min per time)   On track      Depression Screen    11/27/2022    2:10 PM 07/13/2022   12:03 PM 02/16/2022   11:10 AM 01/19/2022   10:55 AM 01/16/2022   10:22 AM 11/11/2021   12:02 PM 10/27/2021    1:20 PM  PHQ 2/9 Scores  PHQ - 2 Score 0 0 0 0 0 0 0  PHQ- 9 Score   0 0 0      Fall Risk    11/27/2022    2:08 PM 11/24/2022    4:23 PM 10/09/2022   11:14 AM 10/09/2022   10:54 AM 02/16/2022   11:10 AM  Fall Risk   Falls in the past year? 0 1 1 0 1  Number falls in past yr: 0 0 0  1  Injury with Fall? 0 0 1  0  Risk for fall due to : No Fall Risks  Impaired mobility;History of fall(s)  History of fall(s)  Follow up Falls prevention discussed  Falls evaluation completed      FALL RISK PREVENTION PERTAINING TO THE HOME:  Any stairs in or around the home? No  If so, are there any without handrails? No  Home free of loose throw rugs in walkways, pet beds, electrical cords, etc? Yes  Adequate lighting in your home to reduce risk of falls? Yes   ASSISTIVE DEVICES UTILIZED TO PREVENT FALLS:  Life alert? No  Use of a cane, walker or w/c? Yes  Grab bars in the bathroom? Yes  Shower chair or bench in shower? Yes  Elevated toilet seat or a handicapped toilet? Yes        11/27/2022    2:12 PM 10/27/2021    1:32 PM  6CIT Screen  What Year? 0 points 0 points  What month? 0 points 0 points  What time? 0 points 0 points  Count back from 20 0 points 0 points  Months in reverse 0 points 0 points  Repeat phrase 0 points 4 points  Total Score 0  points 4 points    Immunizations Immunization History  Administered Date(s) Administered   Influenza-Unspecified 11/02/1995   Tdap 09/09/2018    TDAP status: Up to date  Flu Vaccine status: Declined, Education has been provided regarding the importance of this vaccine but patient still declined. Advised may receive this vaccine at local pharmacy or Health Dept. Aware to provide a copy of the vaccination record if obtained from local pharmacy or Health Dept. Verbalized acceptance and understanding.  Pneumococcal vaccine status: Declined,  Education has been provided regarding the importance of this vaccine but patient still declined. Advised may receive this vaccine at local pharmacy or Health Dept. Aware to provide a copy of the vaccination record if obtained  from local pharmacy or Health Dept. Verbalized acceptance and understanding.   Covid-19 vaccine status: Completed vaccines  Qualifies for Shingles Vaccine? Yes   Zostavax completed No   Shingrix Completed?: No.    Education has been provided regarding the importance of this vaccine. Patient has been advised to call insurance company to determine out of pocket expense if they have not yet received this vaccine. Advised may also receive vaccine at local pharmacy or Health Dept. Verbalized acceptance and understanding.  Screening Tests Health Maintenance  Topic Date Due   Hepatitis C Screening  Never done   MAMMOGRAM  Never done   Pneumonia Vaccine 33+ Years old (1 - PCV) Never done   Lung Cancer Screening  09/10/2019   COVID-19 Vaccine (1) 02/01/2023 (Originally 08/25/1956)   INFLUENZA VACCINE  03/21/2023 (Originally 07/21/2022)   Zoster Vaccines- Shingrix (1 of 2) 04/17/2023 (Originally 08/25/1970)   Medicare Annual Wellness (AWV)  11/28/2023   COLONOSCOPY (Pts 45-43yr Insurance coverage will need to be confirmed)  09/11/2026   DTaP/Tdap/Td (2 - Td or Tdap) 09/09/2028   DEXA SCAN  Completed   HPV VACCINES  Aged Out    Health  Maintenance  Health Maintenance Due  Topic Date Due   Hepatitis C Screening  Never done   MAMMOGRAM  Never done   Pneumonia Vaccine 71 Years old (1 - PCV) Never done   Lung Cancer Screening  09/10/2019    Colorectal cancer screening: Type of screening: Colonoscopy. Completed 09/11/2016. Repeat every 10 years  Mammogram status: Ordered patient declines . Pt provided with contact info and advised to call to schedule appt.   Bone Density status: Completed 06/06/2021. Results reflect: Bone density results: OSTEOPENIA. Repeat every 5 years.  Lung Cancer Screening: (Low Dose CT Chest recommended if Age 71-80years, 30 pack-year currently smoking OR have quit w/in 15years.) does not qualify.   Lung Cancer Screening Referral: n/a  Additional Screening:  Hepatitis C Screening: does not qualify;  Vision Screening: Recommended annual ophthalmology exams for early detection of glaucoma and other disorders of the eye. Is the patient up to date with their annual eye exam?  Yes  Who is the provider or what is the name of the office in which the patient attends annual eye exams? Dr.johnson  If pt is not established with a provider, would they like to be referred to a provider to establish care? No .   Dental Screening: Recommended annual dental exams for proper oral hygiene  Community Resource Referral / Chronic Care Management: CRR required this visit?  No   CCM required this visit?  No      Plan:     I have personally reviewed and noted the following in the patient's chart:   Medical and social history Use of alcohol, tobacco or illicit drugs  Current medications and supplements including opioid prescriptions. Patient is not currently taking opioid prescriptions. Functional ability and status Nutritional status Physical activity Advanced directives List of other physicians Hospitalizations, surgeries, and ER visits in previous 12 months Vitals Screenings to include cognitive,  depression, and falls Referrals and appointments  In addition, I have reviewed and discussed with patient certain preventive protocols, quality metrics, and best practice recommendations. A written personalized care plan for preventive services as well as general preventive health recommendations were provided to patient.     LDaphane Shepherd LPN   197/08/[id number]  Nurse Notes: Declines Mammogram , Flu vaccine

## 2022-11-27 NOTE — Patient Instructions (Signed)
[name] , Thank you for taking time to come for your Medicare Wellness Visit. I appreciate your ongoing commitment to your health goals. Please review the following plan we discussed and let me know if I can assist you in the future.   These are the goals we discussed:  Goals      Blood Pressure < 130/80     Exercise 3x per week (30 min per time)        This is a list of the screening recommended for you and due dates:  Health Maintenance  Topic Date Due   Hepatitis C Screening: USPSTF Recommendation to screen - Ages 24-79 yo.  Never done   Mammogram  Never done   Pneumonia Vaccine (1 - PCV) Never done   Screening for Lung Cancer  09/10/2019   COVID-19 Vaccine (1) 02/01/2023*   Flu Shot  03/21/2023*   Zoster (Shingles) Vaccine (1 of 2) 04/17/2023*   Medicare Annual Wellness Visit  11/28/2023   Colon Cancer Screening  09/11/2026   DTaP/Tdap/Td vaccine (2 - Td or Tdap) 09/09/2028   DEXA scan (bone density measurement)  Completed   HPV Vaccine  Aged Out  *Topic was postponed. The date shown is not the original due date.    Advanced directives: Advance directive discussed with you today. I have provided a copy for you to complete at home and have notarized. Once this is complete please bring a copy in to our office so we can scan it into your chart.   Conditions/risks identified: Aim for 30 minutes of exercise or brisk walking, 6-8 glasses of water, and 5 servings of fruits and vegetables each day.   Next appointment: Follow up in one year for your annual wellness visit    Preventive Care 65 Years and Older, Female Preventive care refers to lifestyle choices and visits with your health care provider that can promote health and wellness. What does preventive care include? A yearly physical exam. This is also called an annual well check. Dental exams once or twice a year. Routine eye exams. Ask your health care provider how often you should have your eyes checked. Personal  lifestyle choices, including: Daily care of your teeth and gums. Regular physical activity. Eating a healthy diet. Avoiding tobacco and drug use. Limiting alcohol use. Practicing safe sex. Taking low-dose aspirin every day. Taking vitamin and mineral supplements as recommended by your health care provider. What happens during an annual well check? The services and screenings done by your health care provider during your annual well check will depend on your age, overall health, lifestyle risk factors, and family history of disease. Counseling  Your health care provider may ask you questions about your: Alcohol use. Tobacco use. Drug use. Emotional well-being. Home and relationship well-being. Sexual activity. Eating habits. History of falls. Memory and ability to understand (cognition). Work and work Statistician. Reproductive health. Screening  You may have the following tests or measurements: Height, weight, and BMI. Blood pressure. Lipid and cholesterol levels. These may be checked every 5 years, or more frequently if you are over 48 years old. Skin check. Lung cancer screening. You may have this screening every year starting at age 53 if you have a 30-pack-year history of smoking and currently smoke or have quit within the past 15 years. Fecal occult blood test (FOBT) of the stool. You may have this test every year starting at age 89. Flexible sigmoidoscopy or colonoscopy. You may have a sigmoidoscopy every 5 years or  a colonoscopy every 10 years starting at age 24. Hepatitis C blood test. Hepatitis B blood test. Sexually transmitted disease (STD) testing. Diabetes screening. This is done by checking your blood sugar (glucose) after you have not eaten for a while (fasting). You may have this done every 1-3 years. Bone density scan. This is done to screen for osteoporosis. You may have this done starting at age 72. Mammogram. This may be done every 1-2 years. Talk to your  health care provider about how often you should have regular mammograms. Talk with your health care provider about your test results, treatment options, and if necessary, the need for more tests. Vaccines  Your health care provider may recommend certain vaccines, such as: Influenza vaccine. This is recommended every year. Tetanus, diphtheria, and acellular pertussis (Tdap, Td) vaccine. You may need a Td booster every 10 years. Zoster vaccine. You may need this after age 81. Pneumococcal 13-valent conjugate (PCV13) vaccine. One dose is recommended after age 7. Pneumococcal polysaccharide (PPSV23) vaccine. One dose is recommended after age 7. Talk to your health care provider about which screenings and vaccines you need and how often you need them. This information is not intended to replace advice given to you by your health care provider. Make sure you discuss any questions you have with your health care provider. Document Released: 01/03/2016 Document Revised: 08/26/2016 Document Reviewed: 10/08/2015 Elsevier Interactive Patient Education  2017 [place] Prevention in the Home Falls can cause injuries. They can happen to people of all ages. There are many things you can do to make your home safe and to help prevent falls. What can I do on the outside of my home? Regularly fix the edges of walkways and driveways and fix any cracks. Remove anything that might make you trip as you walk through a door, such as a raised step or threshold. Trim any bushes or trees on the path to your home. Use bright outdoor lighting. Clear any walking paths of anything that might make someone trip, such as rocks or tools. Regularly check to see if handrails are loose or broken. Make sure that both sides of any steps have handrails. Any raised decks and porches should have guardrails on the edges. Have any leaves, snow, or ice cleared regularly. Use sand or salt on walking paths during winter. Clean  up any spills in your garage right away. This includes oil or grease spills. What can I do in the bathroom? Use night lights. Install grab bars by the toilet and in the tub and shower. Do not use towel bars as grab bars. Use non-skid mats or decals in the tub or shower. If you need to sit down in the shower, use a plastic, non-slip stool. Keep the floor dry. Clean up any water that spills on the floor as soon as it happens. Remove soap buildup in the tub or shower regularly. Attach bath mats securely with double-sided non-slip rug tape. Do not have throw rugs and other things on the floor that can make you trip. What can I do in the bedroom? Use night lights. Make sure that you have a light by your bed that is easy to reach. Do not use any sheets or blankets that are too big for your bed. They should not hang down onto the floor. Have a firm chair that has side arms. You can use this for support while you get dressed. Do not have throw rugs and other things on the floor that  can make you trip. What can I do in the kitchen? Clean up any spills right away. Avoid walking on wet floors. Keep items that you use a lot in easy-to-reach places. If you need to reach something above you, use a strong step stool that has a grab bar. Keep electrical cords out of the way. Do not use floor polish or wax that makes floors slippery. If you must use wax, use non-skid floor wax. Do not have throw rugs and other things on the floor that can make you trip. What can I do with my stairs? Do not leave any items on the stairs. Make sure that there are handrails on both sides of the stairs and use them. Fix handrails that are broken or loose. Make sure that handrails are as long as the stairways. Check any carpeting to make sure that it is firmly attached to the stairs. Fix any carpet that is loose or worn. Avoid having throw rugs at the top or bottom of the stairs. If you do have throw rugs, attach them to the  floor with carpet tape. Make sure that you have a light switch at the top of the stairs and the bottom of the stairs. If you do not have them, ask someone to add them for you. What else can I do to help prevent falls? Wear shoes that: Do not have high heels. Have rubber bottoms. Are comfortable and fit you well. Are closed at the toe. Do not wear sandals. If you use a stepladder: Make sure that it is fully opened. Do not climb a closed stepladder. Make sure that both sides of the stepladder are locked into place. Ask someone to hold it for you, if possible. Clearly mark and make sure that you can see: Any grab bars or handrails. First and last steps. Where the edge of each step is. Use tools that help you move around (mobility aids) if they are needed. These include: Canes. Walkers. Scooters. Crutches. Turn on the lights when you go into a dark area. Replace any light bulbs as soon as they burn out. Set up your furniture so you have a clear path. Avoid moving your furniture around. If any of your floors are uneven, fix them. If there are any pets around you, be aware of where they are. Review your medicines with your doctor. Some medicines can make you feel dizzy. This can increase your chance of falling. Ask your doctor what other things that you can do to help prevent falls. This information is not intended to replace advice given to you by your health care provider. Make sure you discuss any questions you have with your health care provider. Document Released: 10/03/2009 Document Revised: 05/14/2016 Document Reviewed: 01/11/2015 Elsevier Interactive Patient Education  2017 [employer].

## 2022-11-28 DIAGNOSIS — E785 Hyperlipidemia, unspecified: Secondary | ICD-10-CM

## 2022-11-28 DIAGNOSIS — R7989 Other specified abnormal findings of blood chemistry: Secondary | ICD-10-CM | POA: Diagnosis not present

## 2022-11-28 DIAGNOSIS — I480 Paroxysmal atrial fibrillation: Secondary | ICD-10-CM

## 2022-11-28 DIAGNOSIS — F411 Generalized anxiety disorder: Secondary | ICD-10-CM

## 2022-11-28 DIAGNOSIS — I161 Hypertensive emergency: Secondary | ICD-10-CM | POA: Diagnosis present

## 2022-11-28 DIAGNOSIS — D72829 Elevated white blood cell count, unspecified: Secondary | ICD-10-CM

## 2022-11-28 DIAGNOSIS — I739 Peripheral vascular disease, unspecified: Secondary | ICD-10-CM

## 2022-11-28 HISTORY — DX: Hypertensive emergency: I16.1

## 2022-11-28 LAB — COMPREHENSIVE METABOLIC PANEL
ALT: 12 U/L (ref 0–44)
AST: 18 U/L (ref 15–41)
Albumin: 3.1 g/dL — ABNORMAL LOW (ref 3.5–5.0)
Alkaline Phosphatase: 57 U/L (ref 38–126)
Anion gap: 8 (ref 5–15)
BUN: 30 mg/dL — ABNORMAL HIGH (ref 8–23)
CO2: 20 mmol/L — ABNORMAL LOW (ref 22–32)
Calcium: 8.5 mg/dL — ABNORMAL LOW (ref 8.9–10.3)
Chloride: 111 mmol/L (ref 98–111)
Creatinine, Ser: 1.27 mg/dL — ABNORMAL HIGH (ref 0.44–1.00)
GFR, Estimated: 45 mL/min — ABNORMAL LOW (ref >60)
Glucose, Bld: 123 mg/dL — ABNORMAL HIGH (ref 70–99)
Potassium: 4.1 mmol/L (ref 3.5–5.1)
Sodium: 139 mmol/L (ref 135–145)
Total Bilirubin: 0.4 mg/dL (ref 0.3–1.2)
Total Protein: 6.1 g/dL — ABNORMAL LOW (ref 6.5–8.1)

## 2022-11-28 LAB — CBC WITH DIFFERENTIAL/PLATELET
Abs Immature Granulocytes: 0.14 10*3/uL — ABNORMAL HIGH (ref 0.00–0.07)
Basophils Absolute: 0.1 10*3/uL (ref 0.0–0.1)
Basophils Relative: 1 %
Eosinophils Absolute: 0.4 10*3/uL (ref 0.0–0.5)
Eosinophils Relative: 4 %
HCT: 31 % — ABNORMAL LOW (ref 36.0–46.0)
Hemoglobin: 8.8 g/dL — ABNORMAL LOW (ref 12.0–15.0)
Immature Granulocytes: 1 %
Lymphocytes Relative: 10 %
Lymphs Abs: 1.1 10*3/uL (ref 0.7–4.0)
MCH: 23.5 pg — ABNORMAL LOW (ref 26.0–34.0)
MCHC: 28.4 g/dL — ABNORMAL LOW (ref 30.0–36.0)
MCV: 82.9 fL (ref 80.0–100.0)
Monocytes Absolute: 0.3 10*3/uL (ref 0.1–1.0)
Monocytes Relative: 2 %
Neutro Abs: 9.7 10*3/uL — ABNORMAL HIGH (ref 1.7–7.7)
Neutrophils Relative %: 82 %
Platelets: 400 10*3/uL (ref 150–400)
RBC: 3.74 MIL/uL — ABNORMAL LOW (ref 3.87–5.11)
RDW: 18.5 % — ABNORMAL HIGH (ref 11.5–15.5)
WBC: 11.7 10*3/uL — ABNORMAL HIGH (ref 4.0–10.5)
nRBC: 0 % (ref 0.0–0.2)

## 2022-11-28 LAB — URINALYSIS, ROUTINE W REFLEX MICROSCOPIC
Bacteria, UA: NONE SEEN
Bilirubin Urine: NEGATIVE
Glucose, UA: 50 mg/dL — AB
Hgb urine dipstick: NEGATIVE
Ketones, ur: NEGATIVE mg/dL
Nitrite: NEGATIVE
Protein, ur: >=300 mg/dL — AB
Specific Gravity, Urine: 1.007 (ref 1.005–1.030)
pH: 7 (ref 5.0–8.0)

## 2022-11-28 LAB — PROTIME-INR
INR: 1.2 (ref 0.8–1.2)
Prothrombin Time: 15 seconds (ref 11.4–15.2)

## 2022-11-28 LAB — TSH: TSH: 3.729 u[IU]/mL (ref 0.350–4.500)

## 2022-11-28 LAB — TROPONIN I (HIGH SENSITIVITY)
Troponin I (High Sensitivity): 74 ng/L — ABNORMAL HIGH (ref <18)
Troponin I (High Sensitivity): 76 ng/L — ABNORMAL HIGH (ref <18)

## 2022-11-28 LAB — MAGNESIUM: Magnesium: 1.4 mg/dL — ABNORMAL LOW (ref 1.7–2.4)

## 2022-11-28 LAB — APTT: aPTT: 44 seconds — ABNORMAL HIGH (ref 24–36)

## 2022-11-28 MED ORDER — ACETAMINOPHEN 325 MG PO TABS
650.0000 mg | ORAL_TABLET | Freq: Four times a day (QID) | ORAL | Status: DC | PRN
  Administered 2022-11-28: 650 mg via ORAL
  Filled 2022-11-28: qty 2

## 2022-11-28 MED ORDER — ORAL CARE MOUTH RINSE: 15.0000 mL | OROMUCOSAL | Status: DC | PRN

## 2022-11-28 MED ORDER — ACETAMINOPHEN 650 MG RE SUPP: 650.0000 mg | Freq: Four times a day (QID) | RECTAL | Status: DC | PRN

## 2022-11-28 MED ORDER — ASPIRIN 325 MG PO TBEC
325.0000 mg | DELAYED_RELEASE_TABLET | Freq: Every day | ORAL | Status: DC
  Administered 2022-11-28 – 2022-11-29 (×2): 325 mg via ORAL
  Filled 2022-11-28 (×2): qty 1

## 2022-11-28 MED ORDER — ONDANSETRON HCL 4 MG PO TABS: 4.0000 mg | ORAL_TABLET | Freq: Four times a day (QID) | ORAL | Status: DC | PRN

## 2022-11-28 MED ORDER — ONDANSETRON HCL 4 MG/2ML IJ SOLN: 4.0000 mg | Freq: Four times a day (QID) | INTRAMUSCULAR | Status: DC | PRN

## 2022-11-28 MED ORDER — MORPHINE SULFATE (PF) 2 MG/ML IV SOLN: 2.0000 mg | INTRAVENOUS | Status: DC | PRN

## 2022-11-28 MED ORDER — FLECAINIDE ACETATE 50 MG PO TABS
50.0000 mg | ORAL_TABLET | Freq: Two times a day (BID) | ORAL | Status: DC
  Administered 2022-11-28 – 2022-11-29 (×3): 50 mg via ORAL
  Filled 2022-11-28 (×3): qty 1

## 2022-11-28 MED ORDER — MAGNESIUM SULFATE 2 GM/50ML IV SOLN
2.0000 g | Freq: Once | INTRAVENOUS | Status: AC
  Administered 2022-11-28: 2 g via INTRAVENOUS
  Filled 2022-11-28: qty 50

## 2022-11-28 MED ORDER — ESCITALOPRAM OXALATE 10 MG PO TABS
20.0000 mg | ORAL_TABLET | Freq: Every day | ORAL | Status: DC
  Administered 2022-11-28 – 2022-11-29 (×2): 20 mg via ORAL
  Filled 2022-11-28 (×2): qty 2

## 2022-11-28 MED ORDER — IRBESARTAN 150 MG PO TABS
150.0000 mg | ORAL_TABLET | Freq: Every day | ORAL | Status: DC
  Administered 2022-11-28 – 2022-11-29 (×2): 150 mg via ORAL
  Filled 2022-11-28 (×2): qty 1

## 2022-11-28 MED ORDER — FLECAINIDE ACETATE 50 MG PO TABS: 50.0000 mg | ORAL_TABLET | Freq: Two times a day (BID) | ORAL | Status: DC

## 2022-11-28 MED ORDER — APIXABAN 5 MG PO TABS
5.0000 mg | ORAL_TABLET | Freq: Two times a day (BID) | ORAL | Status: DC
  Administered 2022-11-28 – 2022-11-29 (×4): 5 mg via ORAL
  Filled 2022-11-28 (×4): qty 1

## 2022-11-28 MED ORDER — FENOFIBRATE 54 MG PO TABS
54.0000 mg | ORAL_TABLET | Freq: Every day | ORAL | Status: DC
  Administered 2022-11-28 – 2022-11-29 (×2): 54 mg via ORAL
  Filled 2022-11-28 (×3): qty 1

## 2022-11-28 MED ORDER — PANTOPRAZOLE SODIUM 40 MG PO TBEC
40.0000 mg | DELAYED_RELEASE_TABLET | Freq: Every day | ORAL | Status: DC
  Administered 2022-11-28 – 2022-11-29 (×2): 40 mg via ORAL
  Filled 2022-11-28 (×2): qty 1

## 2022-11-28 MED ORDER — EZETIMIBE 10 MG PO TABS
10.0000 mg | ORAL_TABLET | Freq: Every day | ORAL | Status: DC
  Administered 2022-11-28 – 2022-11-29 (×2): 10 mg via ORAL
  Filled 2022-11-28 (×2): qty 1

## 2022-11-28 NOTE — Assessment & Plan Note (Signed)
-  Trop elevation from 41 >> 74 -2/2 to HTN crisis -Currently pain free -Was associated with dyspnea -Dyspnea is also resolved -EKG without acute ischemic changes -Trend trop in the AM -Maintain BP control -Continue to monitor

## 2022-11-28 NOTE — H&P (Signed)
History and Physical  [name] DTO:[id number] DOB: [birth year] DOA: 11/27/2022  Referring physician: Dr. Ripley Fraise PCP: Ivy Lynn, NP   Chief Complaint: Chest Pain  HPI: [name] is a 71 y.o. female with a history of CAD, GERD, HLD, HTN, PVD with R AKA, polycythemia vera, paroxysmal a-fib, and prior stroke and MI presented to the ED on 11/27/22 for chest pain. Pain had resolved prior to arrival. She has been having intermitted chest pain for the past 4-5 days. She becomes short of breath during these episodes. Her most recent episode was more severe and radiated to her left arm. She had some diarrhea, but no nausea or vomiting. EMTs reported BP >200/>100, which she says is why they recommended she come in. She is not currently having symptoms. She denies other pains or other associated symptoms. She mentions she has known stenosis in both carotids and in her legs. She takes Eliquis, though missed this evening's dose, and is on three blood pressure medications at home.   ED Course: APTT was 44, Troponin was 41 on arrival and increased to 74. CBC showed WBC 15.0, hgb 9.1, platelets 472. BMP showed BUN 31, Cr 1.1, GFR 54, Ca 8.7 no recorded albumin. CXR showed Cardiomegaly with pulmonary vascular congestion. Interstitial prominence bilaterally, possible edema or infiltrate. ECG showed LVH. She is being admitted for observation due to increasing troponin.   Review of Systems: All systems reviewed and apart from history of presenting illness, are negative.  Past Medical History:  Diagnosis Date   Arthritis    "fingers, some in my knees" (01/28/2016)   CAD (coronary artery disease), per cath 04/27/13, non obstructive disease, EF 65-70% 05/11/2013   Carotid disease, bilateral ([place]) 09/20/2013   Lt carotid 50-69% stentosis, less on rt   Dysrhythmia    Fatty liver    GERD (gastroesophageal reflux disease)    Glaucoma    H/O cardiovascular stress test 12/27/2009   negative for  ischemia   Headache    "occasional since eye pressure regulated" (01/28/2016)   Hyperlipidemia    Hypertension    Leukocytosis 07/15/2015   Migraine    "stopped w/laser holes to relieve pressure in my eyes; had them 3-4 times/wk before taht" (01/28/2016)   Mitral regurgitation,2+ by cath 05/11/2013   NSTEMI (non-ST elevated myocardial infarction) ([place]) 04/27/2013   Pain    BOTH KNEES - PT HAS TORN MENISCUS LEFT KNEE   Paroxysmal atrial fibrillation (HCC)    hx of a fib on ASA  AND ELIQUIS    Polycythemia vera ([place]) 08/20/2015   JAK-2 positive 07/19/15   PVD (peripheral vascular disease) with claudication ([place]) 07/21/2006   previous L ext iliac stent and rt SFA stent, known occluded Lt SFA   S/P cardiac cath 04/27/2013   NON OBSTRUCTIVE DISEASE, 2+ mr, ef 65-70%   Statin intolerance    Stroke ([place]) 12/21/2004   SWELLING ALL OVER AND RT SIDE OF FACE DRAWN AND SPEECH SLURRED AND NUMBNESS ON RIGHT SIDE-- ALL RESOLVED   TIA (transient ischemic attack)    Past Surgical History:  Procedure Laterality Date   AMPUTATION Right 05/21/2017   Procedure: AMPUTATION ABOVE KNEE;  Surgeon: Rosetta Posner, MD;  Location: [facility];  Service: Vascular;  Laterality: Right;   CARDIAC CATHETERIZATION  04/27/13   non occlusive disease with mild to mod. calcified lesions in ostial LM and proximal LAD and moderate prox. RC AND DISTAL AV GROOVE LCX, ef   CATARACT EXTRACTION W/PHACO  Right 03/21/2015   Procedure: CATARACT EXTRACTION PHACO AND INTRAOCULAR LENS PLACEMENT RIGHT EYE;  Surgeon: Baruch Goldmann, MD;  Location: [facility];  Service: Ophthalmology;  Laterality: Right;  CDE:5.10   CATARACT EXTRACTION W/PHACO Left 05/16/2015   Procedure: CATARACT EXTRACTION PHACO AND INTRAOCULAR LENS PLACEMENT (IOC);  Surgeon: Baruch Goldmann, MD;  Location: [facility];  Service: Ophthalmology;  Laterality: Left;  CDE 5.57   CESAREAN SECTION  1977   COLONOSCOPY N/A 09/11/2016   Procedure: COLONOSCOPY;  Surgeon: Rogene [place], MD;  [facility]: [facility] SUITE;  Service: Endoscopy;  Laterality: N/A;  730-moved to 1:00 Ann notified pt   EMBOLECTOMY Right 02/01/2016   Procedure: Thrombectomy of Right Femoral-Popliteal Bypass Graft; Endarterectomy of Right Below Knee Popliteal Artery and Tibial Peroneal Trunk with Bovine Pericardium Patch Angioplasty.;  Surgeon: Angelia Mould, MD;  Location: [facility];  Service: Vascular;  Laterality: Right;   FEMORAL-POPLITEAL BYPASS GRAFT Right 01/31/2016   Procedure: BYPASS GRAFT RIGHT COMMON  FEMORALTO BELOW KNEE POPLITEAL ARTERY BYPASS GRAFT USING 6MM PROPATEN GORTEX GRAFT;  Surgeon: Mal Misty, MD;  Location: [facility];  Service: Vascular;  Laterality: Right;   FEMORAL-POPLITEAL BYPASS GRAFT Right 01/09/2017   Procedure: THROMBECTOMY OF RIGHT FEMORAL-POPLITEAL  ARTERY BYPASS GRAFT; THROMBECTOMY RIGHT  TIBIAL VESSELS;  Surgeon: [name], MD;  Location: [facility];  Service: Vascular;  Laterality: Right;   FEMORAL-POPLITEAL BYPASS GRAFT Right 05/17/2017   Procedure: RIGHT FEMORAL-TIBIAL PERONEAL TRUNK ARTERY BYPASS GRAFT USING 77mX80cm PROPATEN GRAFT WITH REMOVABLE RING;  Surgeon: ERosetta Posner MD;  [facility]  Service: Vascular;  Laterality: Right;   FRACTURE SURGERY     ILIAC ARTERY STENT  07/2006   external iliac and Rt SFA stent 07/2006 AND THE LEFT WAS IN 2006   KNEE ARTHROSCOPY WITH MEDIAL MENISECTOMY Left 03/27/2014   Procedure: left knee arthorscopy with medial chondraplasty of the medial femoral and patella, medial microfracture technique of medial femoral condyl;  Surgeon: RTobi Bastos MD;  [facility];  Service: Orthopedics;  Laterality: Left;   LEFT HEART CATHETERIZATION WITH CORONARY ANGIOGRAM Right 04/27/2013   Procedure: LEFT HEART CATHETERIZATION WITH CORONARY ANGIOGRAM;  Surgeon: DLeonie Man MD;  [facility];  Service: Cardiovascular;  Laterality: Right;   LOWER EXTREMITY ANGIOGRAM Right 01/31/2016   Procedure: INTRAOP RIGHT LOWER EXTREMITY ANGIOGRAM;  Surgeon:  JMal Misty MD;  [place] [facility]  Service: Vascular;  Laterality: Right;   ORIF WRIST FRACTURE Right 1983   OVARIAN CYST REMOVAL Right    PATCH ANGIOPLASTY Right 01/31/2016   Procedure: RIGHT COMMON FEMORAL AND PROFUNDA FEMORIS ENDARECTOMY WITH PATCH ANGIOPLASTY;  Surgeon: JMal Misty MD;  [place] [facility]  Service: Vascular;  Laterality: Right;   PATCH ANGIOPLASTY Right 01/09/2017   Procedure: PATCH ANGIOPLASTY RIGHT POPLITEAL ARTERY BYPASS GRAFT;  Surgeon: CElam Dutch MD;  [place] [facility]  Service: Vascular;  Laterality: Right;   PERIPHERAL VASCULAR CATHETERIZATION N/A 06/27/2015   Procedure: Lower Extremity Angiography;  Surgeon: JLorretta Harp MD;  [facility];  Service: Cardiovascular;  Laterality: N/A;   PERIPHERAL VASCULAR CATHETERIZATION Bilateral 01/29/2016   Procedure: Lower Extremity Angiography;  Surgeon: MWellington Hampshire MD;  [facility];  Service: Cardiovascular;  Laterality: Bilateral;   PERIPHERAL VASCULAR CATHETERIZATION N/A 01/29/2016   Procedure: Abdominal Aortogram;  Surgeon: MWellington Hampshire MD;  [facility];  Service: Cardiovascular;  Laterality: N/A;   REFRACTIVE SURGERY Bilateral    "6  in one eye; 7 in the other; to relieve pressure; not glaucoma" (01/28/2016)   SFA Right 06/27/2015   overlapping Bahn covered stents   [place] Left 05/17/2017   Procedure: LEFT LEG GREATER SAPHENOUS VEIN HARVEST;  Surgeon: Rosetta Posner, MD;  Location: [facility];  Service: Vascular;  Laterality: Left;   VEIN REPAIR Right 01/31/2016   Procedure: RIGHT GREATER SAPHENOUS VEIN EXAMNED BUT NOT REMOVED;  Surgeon: Mal Misty, MD;  Location: [facility];  Service: Vascular;  Laterality: Right;   Social History:  reports that she quit smoking about 10 years ago. Her smoking use included cigarettes. She has a 45.00 pack-year smoking history. She has never used smokeless tobacco. She reports that she does not drink  alcohol and does not use drugs.  Allergies  Allergen Reactions   Atenolol Rash and Itching   Demerol  [Meperidine Hcl] Itching   Demerol [Meperidine] Other (See Comments) and Itching    Unknown, can't remember  Unknown, can't remember    Gabapentin Anxiety and Other (See Comments)    SI and HI   Inderal [Propranolol] Other (See Comments) and Itching    Doesn't remember    Prednisone Other (See Comments)    Muscle spasms   Statins Itching and Other (See Comments)    Simvastatin-caused severe itching Pravastatin-caused lesser tiching One type of statin? Caused stroke in 2006, and other symptoms as result   Warfarin And Related Other (See Comments)    Caused nose bleeds   Crestor [Rosuvastatin] Itching and Rash   Docosahexaenoic Acid (Dha)     Other reaction(s): Other (See Comments) nosebleeds   Lactose Intolerance (Gi) Other (See Comments)    Bloating, gas   Lipitor [Atorvastatin] Rash   Lovaza [Omega-3-Acid Ethyl Esters] Other (See Comments)    nosebleeds    Penicillins Hives, Rash and Other (See Comments)    Has patient had a PCN reaction causing immediate rash, facial/tongue/throat swelling, SOB or lightheadedness with hypotension: Yes Has patient had a PCN reaction causing severe rash involving mucus membranes or skin necrosis: No Has patient had a PCN reaction that required hospitalization: No Has patient had a PCN reaction occurring within the last 10 years: No If all of the above answers are "NO", then may proceed with Cephalosporin use.  Questionable high fever    Pravastatin Itching and Rash   Simvastatin Itching, Rash and Other (See Comments)   Trilipix [Choline Fenofibrate] Other (See Comments)    Doesn't remember    Warfarin Other (See Comments)    nosebleeds   Hydralazine Swelling   Effexor [Venlafaxine Hcl] Other (See Comments)    Doesn't remember    Nexium [Esomeprazole Magnesium] Rash   Percocet [Oxycodone-Acetaminophen] Other (See Comments)    Doesn't  remember    Plavix [Clopidogrel Bisulfate] Rash    Family History  Problem Relation Age of Onset   CAD Mother    Lung cancer Mother    Heart attack Mother    CAD Father    Heart disease Father    Heart attack Father 68       died in hes 41's with heart disease   Healthy Sister    Liver cancer Brother    Healthy Sister    CAD Brother        has had CABG   CAD Brother     Prior to Admission medications   Medication Sig Start Date End Date Taking? Authorizing Provider  acetaminophen (TYLENOL)  500 MG tablet Take 2 tablets (1,000 mg total) by mouth every 6 (six) hours. 09/16/18   Norm Parcel, PA-C  ARTIFICIAL TEAR OP Place 1 drop into both eyes as needed (for dry eyes).    [provider]  aspirin EC 325 MG tablet Take 325 mg by mouth daily.    [provider]  collagenase (SANTYL) 250 UNIT/GM ointment     [provider]  diclofenac Sodium (VOLTAREN) 1 % GEL Apply 2 g topically 4 (four) times daily. 09/03/21   Ivy Lynn, NP  diltiazem (CARDIZEM CD) 240 MG 24 hr capsule TAKE 1 CAPSULE EVERY MORNING Patient taking differently: Take 240 mg by mouth every morning. 08/20/22   Ivy Lynn, NP  ELIQUIS 5 MG TABS tablet TAKE ONE TABLET TWICE DAILY 10/29/22   Ivy Lynn, NP  escitalopram (LEXAPRO) 20 MG tablet TAKE ONE TABLET ONCE DAILY 11/03/22   Ivy Lynn, NP  ezetimibe (ZETIA) 10 MG tablet Take 1 tablet (10 mg total) by mouth daily. 02/16/22   Ivy Lynn, NP  fenofibrate (TRICOR) 145 MG tablet TAKE ONE TABLET ONCE DAILY 10/29/22   Ivy Lynn, NP  flecainide (TAMBOCOR) 50 MG tablet TAKE ONE TABLET TWICE DAILY 10/29/22   Ivy Lynn, NP  hydroxyurea (HYDREA) 500 MG capsule TAKE 1 CAPSULE WITH FOOD EVERY DAY EXCEPT SUNDAYS 09/30/22   Truitt Merle, MD  lisinopril (ZESTRIL) 20 MG tablet     [provider]  mupirocin cream (BACTROBAN) 2 % Apply 1 application topically 2 (two) times daily. 02/16/22   Ivy Lynn, NP   omeprazole (PRILOSEC) 20 MG capsule TAKE ONE CAPSULE TWICE DAILY BEFORE MEALS 10/06/22   Ivy Lynn, NP  traMADol (ULTRAM) 50 MG tablet Take 1 tablet (50 mg total) by mouth daily as needed for moderate pain. 11/09/22 12/09/22  Ivy Lynn, NP  traMADol (ULTRAM) 50 MG tablet Take 1 tablet (50 mg total) by mouth daily as needed for moderate pain. 12/09/22 01/08/23  Ivy Lynn, NP  valsartan (DIOVAN) 160 MG tablet Take 160 mg every afternoon 12/23/21   Lorretta Harp, MD  valsartan (DIOVAN) 320 MG tablet Take 320 mg every morning 12/23/21   Lorretta Harp, MD   Physical Exam: Vitals:   11/27/22 2330 11/28/22 0000 11/28/22 0030 11/28/22 0100  BP: (!) 146/92 118/68 113/89 116/89  Pulse: 89 84 77 86  Resp: '17 17 18 16  '$ Temp:      TempSrc:      SpO2: 95% 95% 96% 92%  Weight:      Height:        General exam: Moderately built and nourished patient, lying comfortably supine on the gurney in no obvious distress. Head, eyes and ENT: Nontraumatic and normocephalic. Pupils equally reacting to light and accommodation. Oral mucosa moist. Neck: Supple. No JVD, carotid bruit or thyromegaly. Lymphatics: No lymphadenopathy. Respiratory system: Clear to auscultation. No increased work of breathing. Cardiovascular system: S1 and S2 heard, RRR. No JVD, murmurs, gallops, clicks or pedal edema. Gastrointestinal system: Abdomen is nondistended, soft and nontender. No organomegaly or masses appreciated. Central nervous system: Alert and oriented. No focal neurological deficits. Extremities: R AKA Skin: No rashes or acute findings. Musculoskeletal system: Negative exam. Psychiatry: Pleasant and cooperative.  Labs on Admission:  Basic Metabolic Panel: Recent Labs  Lab 11/27/22 2140  NA 139  K 5.0  CL 109  CO2 21*  GLUCOSE 94  BUN 31*  CREATININE 1.10*  CALCIUM 8.7*   Liver Function Tests: No results for input(s): "AST", "ALT", "ALKPHOS", "BILITOT", "PROT", "ALBUMIN" in the last  168 hours. No results for input(s): "LIPASE", "AMYLASE" in the last 168 hours. No results for input(s): "AMMONIA" in the last 168 hours. CBC: Recent Labs  Lab 11/27/22 2140  WBC 15.0*  HGB 9.1*  HCT 32.0*  MCV 82.7  PLT 472*   Cardiac Enzymes: No results for input(s): "CKTOTAL", "CKMB", "CKMBINDEX", "TROPONINI" in the last 168 hours.  BNP (last 3 results) No results for input(s): "PROBNP" in the last 8760 hours. CBG: No results for input(s): "GLUCAP" in the last 168 hours.  Radiological Exams on Admission: DG Chest 2 View  Result Date: 11/27/2022 CLINICAL DATA:  Chest pain. EXAM: CHEST - 2 VIEW COMPARISON:  09/10/2018. FINDINGS: Heart is mildly enlarged and the mediastinal contour is within normal limits. There is atherosclerotic calcification of the aorta. The pulmonary vasculature is distended. Interstitial prominence is present bilaterally. No consolidation, effusion, or pneumothorax. No acute fracture is seen. IMPRESSION: 1. Cardiomegaly with pulmonary vascular congestion. 2. Interstitial prominence bilaterally, possible edema or infiltrate. Electronically Signed   By: Brett Fairy M.D.   On: 11/27/2022 21:39     Assessment/Plan PVD (peripheral vascular disease) ([place]) -Vasculopath with history of Right AKA -Continue asa -No statin on home med list   PAF (paroxysmal atrial fibrillation), maintaing SR -Continue flecainide  -continue eliquis -Currently in SR -Continue to monitor on tele  Leukocytosis -Chronically elevated -No infectious signs/symptoms -Trend in the AM -Check UA -Continue to monitor   Hypertensive emergency -BP initially 248/102 -Improved spontaneously to 113/.68 -Associated elevated trop and possible edema on CXR -Continue ARB -EKG is without acute ischemic changes -Repeat EKG with any further Chest pain -Patient currently pain free -Monitor on tele   Hyperlipidemia -Continue zetia, fenofibrate,   GAD (generalized anxiety  disorder) -Continue lexapro -Continue to monitor  Elevated troponin -Trop elevation from 41 >> 74 -2/2 to HTN crisis -Currently pain free -Was associated with dyspnea -Dyspnea is also resolved -EKG without acute ischemic changes -Trend trop in the AM -Maintain BP control -Continue to monitor   DVT Prophylaxis: Eliquis and SCDs Code Status: Full  Family Communication: None at bedside  Disposition Plan: Home, hopefully once troponin trends down, [facility] transfer if troponin trends upwards   Time spent: 15mn  Amila Callies D Cleland Simkins, OMS IV [facility] How to contact the TM[facility] CenterAttending or Consulting provider 7A - 7P or covering provider during after hours 7P -7A, for this patient?  Check the care team in [facility] look for a) attending/consulting TR[facility] provider listed and b) the [facility] Hospitalteam listed Log into [email or web address] and use [place]'s universal password to access. If you do not have the password, please contact the hospital operator. Locate the T[facility] Hospitalprovider you are looking for under [facility] and page to a number that you can be directly reached. If you still have difficulty reaching the provider, please page the [facility](Director on Call) for the Hospitalists listed on amion for assistance.

## 2022-11-28 NOTE — Assessment & Plan Note (Signed)
-  Continue lexapro -Continue to monitor

## 2022-11-28 NOTE — ED Notes (Signed)
Pt given sandwich bag and beverage per request

## 2022-11-28 NOTE — Assessment & Plan Note (Addendum)
-  Continue zetia, fenofibrate,

## 2022-11-28 NOTE — Progress Notes (Addendum)
Patient immediately this morning for hypertension urgency, bp 248/102 on presentation with sob. Detail please see HPI She has improved, currently denies chest pain, no sob at rest, she reports use wheelchair/walker at baseline, currently her house does not have power.   Hypomagnesemia; replace mag, recheck in am  She agreed to go home tomorrow if she continue to improves.

## 2022-11-28 NOTE — Assessment & Plan Note (Signed)
-  Continue flecainide  -continue eliquis -Currently in SR -Continue to monitor on tele

## 2022-11-28 NOTE — Assessment & Plan Note (Signed)
-  Chronically elevated -No infectious signs/symptoms -Trend in the AM -Check UA -Continue to monitor

## 2022-11-28 NOTE — Assessment & Plan Note (Addendum)
-  Vasculopath with history of Right AKA -Continue asa -No statin on home med list

## 2022-11-28 NOTE — Assessment & Plan Note (Signed)
-  BP initially 248/102 -Improved spontaneously to 113/.68 -Associated elevated trop and possible edema on CXR -Continue ARB -EKG is without acute ischemic changes -Repeat EKG with any further Chest pain -Patient currently pain free -Monitor on tele

## 2022-11-28 NOTE — Assessment & Plan Note (Signed)
>>  ASSESSMENT AND PLAN FOR MIXED HYPERLIPIDEMIA WRITTEN ON 11/28/2022  1:57 AM BY ZIERLE-GHOSH, ASIA B, DO  -Continue zetia , fenofibrate ,

## 2022-11-28 NOTE — ED Notes (Signed)
Breakfast tray given to pt 

## 2022-11-28 NOTE — ED Notes (Signed)
Dr Geanie Logan at bedside- ok for pt to take night meds from home -Flecainide  taken by pt

## 2022-11-28 NOTE — ED Notes (Signed)
Lunch tray given. 

## 2022-11-29 DIAGNOSIS — I161 Hypertensive emergency: Secondary | ICD-10-CM | POA: Diagnosis not present

## 2022-11-29 LAB — BASIC METABOLIC PANEL
Anion gap: 4 — ABNORMAL LOW (ref 5–15)
BUN: 32 mg/dL — ABNORMAL HIGH (ref 8–23)
CO2: 22 mmol/L (ref 22–32)
Calcium: 8.2 mg/dL — ABNORMAL LOW (ref 8.9–10.3)
Chloride: 112 mmol/L — ABNORMAL HIGH (ref 98–111)
Creatinine, Ser: 1.23 mg/dL — ABNORMAL HIGH (ref 0.44–1.00)
GFR, Estimated: 47 mL/min — ABNORMAL LOW (ref >60)
Glucose, Bld: 99 mg/dL (ref 70–99)
Potassium: 5 mmol/L (ref 3.5–5.1)
Sodium: 138 mmol/L (ref 135–145)

## 2022-11-29 LAB — MAGNESIUM: Magnesium: 2.2 mg/dL (ref 1.7–2.4)

## 2022-11-29 NOTE — Progress Notes (Signed)
Nsg Discharge Note  Admit Date:  11/27/2022 Discharge date: 11/29/2022   [name] to be D/C'd Home per MD order.  AVS completed.   Patient/caregiver able to verbalize understanding.  Discharge Medication: Allergies as of 11/29/2022       Reactions   Atenolol Rash, Itching   Demerol  [meperidine Hcl] Itching   Demerol [meperidine] Other (See Comments), Itching   Unknown, can't remember  Unknown, can't remember    Gabapentin Anxiety, Other (See Comments)   SI and HI   Inderal [propranolol] Other (See Comments), Itching   Doesn't remember    Prednisone Other (See Comments)   Muscle spasms   Statins Itching, Other (See Comments)   Simvastatin-caused severe itching Pravastatin-caused lesser tiching One type of statin? Caused stroke in 2006, and other symptoms as result   Warfarin And Related Other (See Comments)   Caused nose bleeds   Crestor [rosuvastatin] Itching, Rash   Docosahexaenoic Acid (dha)    Other reaction(s): Other (See Comments) nosebleeds   Lactose Intolerance (gi) Other (See Comments)   Bloating, gas   Lipitor [atorvastatin] Rash   Lovaza [omega-3-acid Ethyl Esters] Other (See Comments)   nosebleeds   Penicillins Hives, Rash, Other (See Comments)   Has patient had a PCN reaction causing immediate rash, facial/tongue/throat swelling, SOB or lightheadedness with hypotension: Yes Has patient had a PCN reaction causing severe rash involving mucus membranes or skin necrosis: No Has patient had a PCN reaction that required hospitalization: No Has patient had a PCN reaction occurring within the last 10 years: No If all of the above answers are "NO", then may proceed with Cephalosporin use. Questionable high fever    Pravastatin Itching, Rash   Simvastatin Itching, Rash, Other (See Comments)   Trilipix [choline Fenofibrate] Other (See Comments)   Doesn't remember    Warfarin Other (See Comments)   nosebleeds   Hydralazine Swelling   Effexor [venlafaxine Hcl]  Other (See Comments)   Doesn't remember    Nexium [esomeprazole Magnesium] Rash   Percocet [oxycodone-acetaminophen] Other (See Comments)   Doesn't remember    Plavix [clopidogrel Bisulfate] Rash        Medication List     TAKE these medications    acetaminophen 500 MG tablet Commonly known as: TYLENOL Take 2 tablets (1,000 mg total) by mouth every 6 (six) hours.   ARTIFICIAL TEAR OP Place 1 drop into both eyes as needed (for dry eyes).   aspirin EC 325 MG tablet Take 325 mg by mouth daily.   diclofenac Sodium 1 % Gel Commonly known as: VOLTAREN Apply 2 g topically 4 (four) times daily. What changed:  when to take this reasons to take this   diltiazem 240 MG 24 hr capsule Commonly known as: CARDIZEM CD TAKE 1 CAPSULE EVERY MORNING   Eliquis 5 MG Tabs tablet Generic drug: apixaban TAKE ONE TABLET TWICE DAILY What changed: how much to take   escitalopram 20 MG tablet Commonly known as: LEXAPRO TAKE ONE TABLET ONCE DAILY   ezetimibe 10 MG tablet Commonly known as: ZETIA Take 1 tablet (10 mg total) by mouth daily.   fenofibrate 145 MG tablet Commonly known as: TRICOR TAKE ONE TABLET ONCE DAILY   flecainide 50 MG tablet Commonly known as: TAMBOCOR TAKE ONE TABLET TWICE DAILY   hydroxyurea 500 MG capsule Commonly known as: HYDREA TAKE 1 CAPSULE WITH FOOD EVERY DAY EXCEPT SUNDAYS What changed: See the new instructions.   omeprazole 20 MG capsule Commonly known as: PRILOSEC TAKE  ONE CAPSULE TWICE DAILY BEFORE MEALS What changed: See the new instructions.   traMADol 50 MG tablet Commonly known as: ULTRAM Take 1 tablet (50 mg total) by mouth daily as needed for moderate pain. Start taking on: December 09, 2022   valsartan 320 MG tablet Commonly known as: Diovan Take 320 mg every morning   valsartan 160 MG tablet Commonly known as: Diovan Take 160 mg every afternoon        Discharge Assessment: Vitals:   11/29/22 0205 11/29/22 0620  BP:  126/82 112/78  Pulse: 77 79  Resp: 16 16  Temp: 97.8 F (36.6 C) 98.2 F (36.8 C)  SpO2: 98% 97%   Skin clean, dry and intact without evidence of skin break down, no evidence of skin tears noted. IV catheter discontinued intact. Site without signs and symptoms of complications - no redness or edema noted at insertion site, patient denies c/o pain - only slight tenderness at site.  Dressing with slight pressure applied.  D/c Instructions-Education: Discharge instructions given to patient/family with verbalized understanding. D/c education completed with patient/family including follow up instructions, medication list, d/c activities limitations if indicated, with other d/c instructions as indicated by MD - patient able to verbalize understanding, all questions fully answered. Patient instructed to return to ED, call 911, or call MD for any changes in condition.  Patient escorted via [place], and D/C home via private auto.  Kathie Rhodes, RN 11/29/2022 11:09 AM

## 2022-11-29 NOTE — Discharge Summary (Signed)
Discharge Summary  [name]:[id number] DOB: [birth year]  PCP: Ivy Lynn, NP  Admit date: 11/27/2022 Discharge date: 11/29/2022    Time spent: 23mns  Recommendations for Outpatient Follow-up:  F/u with PCP within a week  for hospital discharge follow up, repeat cbc/bmp at follow up F/u with cardiology Dr BGwenlyn Found, message sent to Dr BGwenlyn Found    Discharge Diagnoses:  Active Hospital Problems   Diagnosis Date Noted   Hypertensive emergency 11/28/2022   Elevated troponin 05/04/2020   GAD (generalized anxiety disorder) 02/12/2020   PVD (peripheral vascular disease) ([facility]    Leukocytosis 07/15/2015   PAF (paroxysmal atrial fibrillation), maintaing SR 05/11/2013   Hyperlipidemia 04/27/2013    Resolved Hospital Problems  No resolved problems to display.    Discharge Condition: stable  Diet recommendation: heart healthy  Filed Weights   11/27/22 2046  Weight: 61.7 kg    History of present illness: ( per admitting ) [name] a 71y.o. female with a history of CAD, GERD, HLD, HTN, PVD with R AKA, polycythemia vera, paroxysmal a-fib, and prior stroke and MI presented to the ED on 11/27/22 for chest pain. Pain had resolved prior to arrival. She has been having intermitted chest pain for the past 4-5 days. She becomes short of breath during these episodes. Her most recent episode was more severe and radiated to her left arm. She had some diarrhea, but no nausea or vomiting. EMTs reported BP >200/>100, which she says is why they recommended she come in. She is not currently having symptoms. She denies other pains or other associated symptoms. She mentions she has known stenosis in both carotids and in her legs. She takes Eliquis, though missed this evening's dose  Hospital Course:  Principal Problem:   Hypertensive emergency Active Problems:   Hyperlipidemia   PAF (paroxysmal atrial fibrillation), maintaing SR   Leukocytosis   Elevated troponin   PVD  (peripheral vascular disease) ([facility])   GAD (generalized anxiety disorder)   Assessment and Plan:    * Hypertensive emergency with mild pulmonary edema/sob/chest pain -BP initially 248/102, all symptoms resolved spontaneously when arrived to the ED -Associated elevated trop and possible edema on CXR, EKG is without acute ischemic changes, she did not have any chest pain in the hospital, high-sensitivity troponin not consistent with ACS -she reports h/o episodic hypertension , she reports compliance with her home bp meds, she thinks episodic hypertension due to stress -I have reviewed recent CT angio from 08/2022 and did show bilateral renal artery stenosis mild to moderate, adrenal gland unremarkable -She does has h/o polycythemia vera which could contribute to hypertension, however her hemoglobin is not elevated -continue all home bp meds -message sent to cardiology D BGwenlyn Foundfor close follow up, consider secondary HTN work up?    Potassium level high normal, monitor potassium level while on ARB, pcp and cardiology to adjust bp meds if indicated.  Polycythemia vera -Getting phlebotomy as needed -On Hydrea and asa  Leukocytosis -Chronically elevated -No infectious signs/symptoms -UA no sign of infection  PAF (paroxysmal atrial fibrillation), maintaing SR -Continue flecainide  -continue eliquis  PVD (peripheral vascular disease) ([facility] -Vasculopath with history of Right AKA, use walker and wheelchair, she declined Pt eval -Continue asa 325 -h/o  statin allergy   Hyperlipidemia/aortic atherosclerosis -Continue zetia, fenofibrate,   -GAD (generalized anxiety disorder) -Continue lexapro   Code status, she is very clear that she wants to be DNR.    Discharge Exam: BP 112/78 (  BP Location: Left Arm)   Pulse 79   Temp 98.2 F (36.8 C) (Oral)   Resp 16   Ht '5\' 4"'$  (1.626 m)   Wt 61.7 kg   SpO2 97%   BMI 23.34 kg/m   General: NAD, right AKA Cardiovascular:  RRR Respiratory: Normal respiratory effort    Discharge Instructions     Diet - low sodium heart healthy   Complete by: As directed    Increase activity slowly   Complete by: As directed       Allergies as of 11/29/2022       Reactions   Atenolol Rash, Itching   Demerol  [meperidine Hcl] Itching   Demerol [meperidine] Other (See Comments), Itching   Unknown, can't remember  Unknown, can't remember    Gabapentin Anxiety, Other (See Comments)   SI and HI   Inderal [propranolol] Other (See Comments), Itching   Doesn't remember    Prednisone Other (See Comments)   Muscle spasms   Statins Itching, Other (See Comments)   Simvastatin-caused severe itching Pravastatin-caused lesser tiching One type of statin? Caused stroke in 2006, and other symptoms as result   Warfarin And Related Other (See Comments)   Caused nose bleeds   Crestor [rosuvastatin] Itching, Rash   Docosahexaenoic Acid (dha)    Other reaction(s): Other (See Comments) nosebleeds   Lactose Intolerance (gi) Other (See Comments)   Bloating, gas   Lipitor [atorvastatin] Rash   Lovaza [omega-3-acid Ethyl Esters] Other (See Comments)   nosebleeds   Penicillins Hives, Rash, Other (See Comments)   Has patient had a PCN reaction causing immediate rash, facial/tongue/throat swelling, SOB or lightheadedness with hypotension: Yes Has patient had a PCN reaction causing severe rash involving mucus membranes or skin necrosis: No Has patient had a PCN reaction that required hospitalization: No Has patient had a PCN reaction occurring within the last 10 years: No If all of the above answers are "NO", then may proceed with Cephalosporin use. Questionable high fever    Pravastatin Itching, Rash   Simvastatin Itching, Rash, Other (See Comments)   Trilipix [choline Fenofibrate] Other (See Comments)   Doesn't remember    Warfarin Other (See Comments)   nosebleeds   Hydralazine Swelling   Effexor [venlafaxine Hcl] Other (See  Comments)   Doesn't remember    Nexium [esomeprazole Magnesium] Rash   Percocet [oxycodone-acetaminophen] Other (See Comments)   Doesn't remember    Plavix [clopidogrel Bisulfate] Rash        Medication List     TAKE these medications    acetaminophen 500 MG tablet Commonly known as: TYLENOL Take 2 tablets (1,000 mg total) by mouth every 6 (six) hours.   ARTIFICIAL TEAR OP Place 1 drop into both eyes as needed (for dry eyes).   aspirin EC 325 MG tablet Take 325 mg by mouth daily.   diclofenac Sodium 1 % Gel Commonly known as: VOLTAREN Apply 2 g topically 4 (four) times daily. What changed:  when to take this reasons to take this   diltiazem 240 MG 24 hr capsule Commonly known as: CARDIZEM CD TAKE 1 CAPSULE EVERY MORNING   Eliquis 5 MG Tabs tablet Generic drug: apixaban TAKE ONE TABLET TWICE DAILY What changed: how much to take   escitalopram 20 MG tablet Commonly known as: LEXAPRO TAKE ONE TABLET ONCE DAILY   ezetimibe 10 MG tablet Commonly known as: ZETIA Take 1 tablet (10 mg total) by mouth daily.   fenofibrate 145 MG tablet Commonly known as:  TRICOR TAKE ONE TABLET ONCE DAILY   flecainide 50 MG tablet Commonly known as: TAMBOCOR TAKE ONE TABLET TWICE DAILY   hydroxyurea 500 MG capsule Commonly known as: HYDREA TAKE 1 CAPSULE WITH FOOD EVERY DAY EXCEPT SUNDAYS What changed: See the new instructions.   omeprazole 20 MG capsule Commonly known as: PRILOSEC TAKE ONE CAPSULE TWICE DAILY BEFORE MEALS What changed: See the new instructions.   traMADol 50 MG tablet Commonly known as: ULTRAM Take 1 tablet (50 mg total) by mouth daily as needed for moderate pain. Start taking on: December 09, 2022   valsartan 320 MG tablet Commonly known as: Diovan Take 320 mg every morning   valsartan 160 MG tablet Commonly known as: Diovan Take 160 mg every afternoon       Allergies  Allergen Reactions   Atenolol Rash and Itching   Demerol  [Meperidine  Hcl] Itching   Demerol [Meperidine] Other (See Comments) and Itching    Unknown, can't remember  Unknown, can't remember    Gabapentin Anxiety and Other (See Comments)    SI and HI   Inderal [Propranolol] Other (See Comments) and Itching    Doesn't remember    Prednisone Other (See Comments)    Muscle spasms   Statins Itching and Other (See Comments)    Simvastatin-caused severe itching Pravastatin-caused lesser tiching One type of statin? Caused stroke in 2006, and other symptoms as result   Warfarin And Related Other (See Comments)    Caused nose bleeds   Crestor [Rosuvastatin] Itching and Rash   Docosahexaenoic Acid (Dha)     Other reaction(s): Other (See Comments) nosebleeds   Lactose Intolerance (Gi) Other (See Comments)    Bloating, gas   Lipitor [Atorvastatin] Rash   Lovaza [Omega-3-Acid Ethyl Esters] Other (See Comments)    nosebleeds    Penicillins Hives, Rash and Other (See Comments)    Has patient had a PCN reaction causing immediate rash, facial/tongue/throat swelling, SOB or lightheadedness with hypotension: Yes Has patient had a PCN reaction causing severe rash involving mucus membranes or skin necrosis: No Has patient had a PCN reaction that required hospitalization: No Has patient had a PCN reaction occurring within the last 10 years: No If all of the above answers are "NO", then may proceed with Cephalosporin use.  Questionable high fever    Pravastatin Itching and Rash   Simvastatin Itching, Rash and Other (See Comments)   Trilipix [Choline Fenofibrate] Other (See Comments)    Doesn't remember    Warfarin Other (See Comments)    nosebleeds   Hydralazine Swelling   Effexor [Venlafaxine Hcl] Other (See Comments)    Doesn't remember    Nexium [Esomeprazole Magnesium] Rash   Percocet [Oxycodone-Acetaminophen] Other (See Comments)    Doesn't remember    Plavix [Clopidogrel Bisulfate] Rash    Follow-up Information     Ivy Lynn, NP Follow up.    Specialty: Nurse Practitioner Contact information: [place] [address] [phone]         Lorretta Harp, MD Follow up.   Specialties: Cardiology, Radiology Why: for blood pressure management, please check your blood pressure at home twice a day and bring in record to your appointment with Dr [name]. Contact information: [address] [place] [address] [phone]                  The results of significant diagnostics from this hospitalization (including imaging, microbiology, ancillary and laboratory) are listed below  for reference.    Significant Diagnostic Studies: DG Chest 2 View  Result Date: 11/27/2022 CLINICAL DATA:  Chest pain. EXAM: CHEST - 2 VIEW COMPARISON:  09/10/2018. FINDINGS: Heart is mildly enlarged and the mediastinal contour is within normal limits. There is atherosclerotic calcification of the aorta. The pulmonary vasculature is distended. Interstitial prominence is present bilaterally. No consolidation, effusion, or pneumothorax. No acute fracture is seen. IMPRESSION: 1. Cardiomegaly with pulmonary vascular congestion. 2. Interstitial prominence bilaterally, possible edema or infiltrate. Electronically Signed   By: Brett Fairy M.D.   On: 11/27/2022 21:39   VAS US CAROTID  Result Date: 11/26/2022 Carotid Arterial Duplex Study Patient Name:  FADUMO HENG  Date of Exam:   11/25/2022 Medical Rec #: [id number]       Accession #:    [id number] Date of Birth: 1951-11-04        Patient Gender: F Patient Age:   66 years Exam Location:  [facility] Procedure:      VAS US CAROTID Referring Phys: Roderic Palau BERRY --------------------------------------------------------------------------------  Indications:       Carotid artery disease and patient denies any cerebrovascular                    symptoms. Risk Factors:      Hypertension, hyperlipidemia, past history of smoking,                    coronary artery disease, PAD. Other  Factors:     TIA. Comparison Study:  In 09/2021, a carotid artery duplex performed at [facility] showed velocities of 194/28 cm/s in the RICA and                    846/65 cm/s in the LICA. Performing Technologist: Sharlett Iles RVT  Examination Guidelines: A complete evaluation includes B-mode imaging, spectral Doppler, color Doppler, and power Doppler as needed of all accessible portions of each vessel. Bilateral testing is considered an integral part of a complete examination. Limited examinations for reoccurring indications may be performed as noted.  Right Carotid Findings: +----------+--------+--------+--------+---------------------+------------------+           PSV cm/sEDV cm/sStenosisPlaque Description   Comments           +----------+--------+--------+--------+---------------------+------------------+ CCA Prox  249     14      >50%    hyperechoic          turbulent          +----------+--------+--------+--------+---------------------+------------------+ CCA Mid   150     15      <50%    hyperechoic                             +----------+--------+--------+--------+---------------------+------------------+ CCA Distal89      13      <50%    heterogenous                            +----------+--------+--------+--------+---------------------+------------------+ ICA Prox  134     18              heterogenous and  irregular                               +----------+--------+--------+--------+---------------------+------------------+ ICA Mid   148     20      40-59%  heterogenous and     tortuous and                                         irregular            turbulent,                                                                stenosis based on                                                         peak systolic                                                              velocities         +----------+--------+--------+--------+---------------------+------------------+ ICA Distal132     22                                   tortuous and                                                              turbulent          +----------+--------+--------+--------+---------------------+------------------+ ECA       145     7               heterogenous and                                                          irregular                               +----------+--------+--------+--------+---------------------+------------------+ +----------+--------+-------+----------------------+-------------------+           PSV cm/sEDV cmsDescribe              Arm Pressure (mmHG) +----------+--------+-------+----------------------+-------------------+ Subclavian400            Stenotic and Turbulent159                 +----------+--------+-------+----------------------+-------------------+ +---------+--------+--+--------+--+---------------------------+  VertebralPSV cm/s67EDV cm/s16Antegrade and Small caliber +---------+--------+--+--------+--+---------------------------+  Left Carotid Findings: +----------+--------+--------+--------+---------------------+------------------+           PSV cm/sEDV cm/sStenosisPlaque Description   Comments           +----------+--------+--------+--------+---------------------+------------------+ CCA Prox  174     10                                   turbulent          +----------+--------+--------+--------+---------------------+------------------+ CCA Mid   124     11      <50%    hyperechoic and                                                           irregular                               +----------+--------+--------+--------+---------------------+------------------+ CCA Distal132     11      <50%    heterogenous         turbulent           +----------+--------+--------+--------+---------------------+------------------+ ICA Prox  106     10              heterogenous                            +----------+--------+--------+--------+---------------------+------------------+ ICA Mid   321     54      40-59%  heterogenous         tortuous and                                                              turbulent          +----------+--------+--------+--------+---------------------+------------------+ ICA Distal133     15                                   turbulent          +----------+--------+--------+--------+---------------------+------------------+ ECA       345     14      >50%    calcific             shadowing,                                                                turbulent          +----------+--------+--------+--------+---------------------+------------------+ +----------+--------+--------+----------------------+-------------------+           PSV cm/sEDV cm/sDescribe              Arm Pressure (mmHG) +----------+--------+--------+----------------------+-------------------+ [id number]             Stenotic  and Turbulent120                 +----------+--------+--------+----------------------+-------------------+ +---------+--------+---+--------+--+------------------------------------------+ VertebralPSV cm/s109EDV cm/s12Antegrade, Atypical and Minimum flow noted +---------+--------+---+--------+--+------------------------------------------+   Summary: Right Carotid: Velocities in the right ICA are consistent with a 40-59%                stenosis. Hemodynamically significant plaque >50% visualized in                the CCA. Stenosis based on peak systolic velocities. Left Carotid: Velocities in the left ICA are consistent with a 40-59% stenosis.               Hemodynamically significant plaque >50% visualized in the CCA. The               ECA appears >50% stenosed. Vertebrals:   Right vertebral artery demonstrates antegrade flow. Small caliber              right vertebral artery. Atypical antegrade minimum flow noted in              the left vertebral artery. Subclavians: Bilateral subclavian arteries were stenotic. Bilateral subclavian              artery flow was disturbed. *See table(s) above for measurements and observations. Suggest follow up study in 12 months. Electronically signed by Larae Grooms MD on 11/26/2022 at 2:31:55 PM.    Final     Microbiology: No results found for this or any previous visit (from the past 240 hour(s)).   Labs: Basic Metabolic Panel: Recent Labs  Lab 11/27/22 2140 11/28/22 0421 11/29/22 0629  NA 139 139 138  K 5.0 4.1 5.0  CL 109 111 112*  CO2 21* 20* 22  GLUCOSE 94 123* 99  BUN 31* 30* 32*  CREATININE 1.10* 1.27* 1.23*  CALCIUM 8.7* 8.5* 8.2*  MG  --  1.4* 2.2   Liver Function Tests: Recent Labs  Lab 11/28/22 0421  AST 18  ALT 12  ALKPHOS 57  BILITOT 0.4  PROT 6.1*  ALBUMIN 3.1*   No results for input(s): "LIPASE", "AMYLASE" in the last 168 hours. No results for input(s): "AMMONIA" in the last 168 hours. CBC: Recent Labs  Lab 11/27/22 2140 11/28/22 0421  WBC 15.0* 11.7*  NEUTROABS  --  9.7*  HGB 9.1* 8.8*  HCT 32.0* 31.0*  MCV 82.7 82.9  PLT 472* 400   Cardiac Enzymes: No results for input(s): "CKTOTAL", "CKMB", "CKMBINDEX", "TROPONINI" in the last 168 hours. BNP: BNP (last 3 results) No results for input(s): "BNP" in the last 8760 hours.  ProBNP (last 3 results) No results for input(s): "PROBNP" in the last 8760 hours.  CBG: No results for input(s): "GLUCAP" in the last 168 hours.  FURTHER DISCHARGE INSTRUCTIONS:   Get Medicines reviewed and adjusted: Please take all your medications with you for your next visit with your Primary MD   Laboratory/radiological data: Please request your Primary MD to go over all hospital tests and procedure/radiological results at the follow up, please ask  your Primary MD to get all Hospital records sent to his/her office.   In some cases, they will be blood work, cultures and biopsy results pending at the time of your discharge. Please request that your primary care M.D. goes through all the records of your hospital data and follows up on these results.   Also Note the following: If you experience worsening of your admission symptoms, develop shortness of breath, life  threatening emergency, suicidal or homicidal thoughts you must seek medical attention immediately by calling 911 or calling your MD immediately  if symptoms less severe.   You must read complete instructions/literature along with all the possible adverse reactions/side effects for all the Medicines you take and that have been prescribed to you. Take any new Medicines after you have completely understood and accpet all the possible adverse reactions/side effects.    Do not drive when taking Pain medications or sleeping medications (Benzodaizepines)   Do not take more than prescribed Pain, Sleep and Anxiety Medications. It is not advisable to combine anxiety,sleep and pain medications without talking with your primary care practitioner   Special Instructions: If you have smoked or chewed Tobacco  in the last 2 yrs please stop smoking, stop any regular Alcohol  and or any Recreational drug use.   Wear Seat belts while driving.   Please note: You were cared for by a hospitalist during your hospital stay. Once you are discharged, your primary care physician will handle any further medical issues. Please note that NO REFILLS for any discharge medications will be authorized once you are discharged, as it is imperative that you return to your primary care physician (or establish a relationship with a primary care physician if you do not have one) for your post hospital discharge needs so that they can reassess your need for medications and monitor your lab values.     Signed:  Florencia Reasons  MD, PhD, FACP  [facility] 11/29/2022, 2:52 PM

## 2022-11-30 ENCOUNTER — Telehealth: Payer: Self-pay

## 2022-11-30 NOTE — Telephone Encounter (Signed)
Transition Care Management Unsuccessful Follow-up Telephone Call  Date of discharge and from where:  [facility] 11/29/2022  Attempts:  1st Attempt  Reason for unsuccessful TCM follow-up call:  Left voice message [name], [place] Direct Dial [phone]

## 2022-12-02 ENCOUNTER — Ambulatory Visit (HOSPITAL_COMMUNITY)
Admission: RE | Admit: 2022-12-02 | Payer: 59 | Source: Ambulatory Visit | Attending: Cardiovascular Disease | Admitting: Cardiovascular Disease

## 2022-12-02 ENCOUNTER — Ambulatory Visit (HOSPITAL_COMMUNITY): Payer: 59 | Attending: Cardiovascular Disease

## 2022-12-02 ENCOUNTER — Encounter (HOSPITAL_COMMUNITY): Payer: Self-pay

## 2022-12-03 NOTE — Telephone Encounter (Signed)
Transition Care Management Follow-up Telephone Call Date of discharge and from where: [name] 11/29/2022 How have you been since you were released from the hospital? good Any questions or concerns? No  Items Reviewed: Did the pt receive and understand the discharge instructions provided? Yes  Medications obtained and verified? Yes  Other? No  Any new allergies since your discharge? No  Dietary orders reviewed? Yes Do you have support at home? Yes   Home Care and Equipment/Supplies: Were home health services ordered? no If so, what is the name of the agency? N/a  Has the agency set up a time to come to the patient's home? not applicable Were any new equipment or medical supplies ordered?  No What is the name of the medical supply agency? N/a Were you able to get the supplies/equipment? not applicable Do you have any questions related to the use of the equipment or supplies? No  Functional Questionnaire: (I = Independent and D = Dependent) ADLs: I  Bathing/Dressing- I  Meal Prep- I  Eating- I  Maintaining continence- I  Transferring/Ambulation- I  Managing Meds- I  Follow up appointments reviewed:  PCP Hospital f/u appt confirmed? Yes  Scheduled to see [name] on 12/09/2022 @ 2:00. [facility] f/u appt confirmed? No   Are transportation arrangements needed? No  If their condition worsens, is the pt aware to call PCP or go to the Emergency Dept.? Yes Was the patient provided with contact information for the PCP's office or ED? Yes Was to pt encouraged to call back with questions or concerns? Yes [name], LPN [name] Direct Dial [phone]

## 2022-12-09 ENCOUNTER — Ambulatory Visit (INDEPENDENT_AMBULATORY_CARE_PROVIDER_SITE_OTHER): Payer: 59 | Admitting: Family Medicine

## 2022-12-09 ENCOUNTER — Encounter: Payer: Self-pay | Admitting: Family Medicine

## 2022-12-09 VITALS — BP 155/71 | HR 95 | Temp 97.9°F | Ht 64.0 in | Wt 134.4 lb

## 2022-12-09 DIAGNOSIS — I6523 Occlusion and stenosis of bilateral carotid arteries: Secondary | ICD-10-CM | POA: Diagnosis not present

## 2022-12-09 DIAGNOSIS — I700 Atherosclerosis of aorta: Secondary | ICD-10-CM | POA: Insufficient documentation

## 2022-12-09 DIAGNOSIS — I739 Peripheral vascular disease, unspecified: Secondary | ICD-10-CM

## 2022-12-09 DIAGNOSIS — S78111A Complete traumatic amputation at level between right hip and knee, initial encounter: Secondary | ICD-10-CM

## 2022-12-09 DIAGNOSIS — Z09 Encounter for follow-up examination after completed treatment for conditions other than malignant neoplasm: Secondary | ICD-10-CM | POA: Diagnosis not present

## 2022-12-09 DIAGNOSIS — D45 Polycythemia vera: Secondary | ICD-10-CM

## 2022-12-09 DIAGNOSIS — I161 Hypertensive emergency: Secondary | ICD-10-CM

## 2022-12-09 DIAGNOSIS — I480 Paroxysmal atrial fibrillation: Secondary | ICD-10-CM

## 2022-12-09 DIAGNOSIS — S78111D Complete traumatic amputation at level between right hip and knee, subsequent encounter: Secondary | ICD-10-CM

## 2022-12-09 DIAGNOSIS — F411 Generalized anxiety disorder: Secondary | ICD-10-CM

## 2022-12-09 NOTE — Patient Instructions (Signed)
Goal BP:  For patients younger than 60: Goal BP < 140/90. For patients 60 and older: Goal BP < 150/90. For patients with diabetes: Goal BP < 140/90.  Take your medications faithfully as prescribed. Maintain a healthy weight. Get at least 150 minutes of aerobic exercise per week. Minimize salt intake, less than 2000 mg per day. Minimize alcohol intake.  DASH Eating Plan DASH stands for "Dietary Approaches to Stop Hypertension." The DASH eating plan is a healthy eating plan that has been shown to reduce high blood pressure (hypertension). Additional health benefits may include reducing the risk of type 2 diabetes mellitus, heart disease, and stroke. The DASH eating plan may also help with weight loss.  WHAT DO I NEED TO KNOW ABOUT THE DASH EATING PLAN? For the DASH eating plan, you will follow these general guidelines: Choose foods with a percent daily value for sodium of less than 5% (as listed on the food label). Use salt-free seasonings or herbs instead of table salt or sea salt. Check with your health care provider or pharmacist before using salt substitutes. Eat lower-sodium products, often labeled as "lower sodium" or "no salt added." Eat fresh foods. Eat more vegetables, fruits, and low-fat dairy products. Choose whole grains. Look for the word "whole" as the first word in the ingredient list. Choose fish and skinless chicken or turkey more often than red meat. Limit fish, poultry, and meat to 6 oz (170 g) each day. Limit sweets, desserts, sugars, and sugary drinks. Choose heart-healthy fats. Limit cheese to 1 oz (28 g) per day. Eat more home-cooked food and less restaurant, buffet, and fast food. Limit fried foods. Cook foods using methods other than frying. Limit canned vegetables. If you do use them, rinse them well to decrease the sodium. When eating at a restaurant, ask that your food be prepared with less salt, or no salt if possible.  WHAT FOODS CAN I EAT? Seek help from  a dietitian for individual calorie needs.  Grains Whole grain or whole wheat bread. Brown rice. Whole grain or whole wheat pasta. Quinoa, bulgur, and whole grain cereals. Low-sodium cereals. Corn or whole wheat flour tortillas. Whole grain cornbread. Whole grain crackers. Low-sodium crackers.  Vegetables Fresh or frozen vegetables (raw, steamed, roasted, or grilled). Low-sodium or reduced-sodium tomato and vegetable juices. Low-sodium or reduced-sodium tomato sauce and paste. Low-sodium or reduced-sodium canned vegetables.   Fruits All fresh, canned (in natural juice), or frozen fruits.  Meat and Other Protein Products Ground beef (85% or leaner), grass-fed beef, or beef trimmed of fat. Skinless chicken or turkey. Ground chicken or turkey. Pork trimmed of fat. All fish and seafood. Eggs. Dried beans, peas, or lentils. Unsalted nuts and seeds. Unsalted canned beans.  Dairy Low-fat dairy products, such as skim or 1% milk, 2% or reduced-fat cheeses, low-fat ricotta or cottage cheese, or plain low-fat yogurt. Low-sodium or reduced-sodium cheeses.  Fats and Oils Tub margarines without trans fats. Light or reduced-fat mayonnaise and salad dressings (reduced sodium). Avocado. Safflower, olive, or canola oils. Natural peanut or almond butter.  Other Unsalted popcorn and pretzels. The items listed above may not be a complete list of recommended foods or beverages. Contact your dietitian for more options.  WHAT FOODS ARE NOT RECOMMENDED?  Grains White bread. White pasta. White rice. Refined cornbread. Bagels and croissants. Crackers that contain trans fat.  Vegetables Creamed or fried vegetables. Vegetables in a cheese sauce. Regular canned vegetables. Regular canned tomato sauce and paste. Regular tomato and vegetable juices.    Fruits Dried fruits. Canned fruit in light or heavy syrup. Fruit juice.  Meat and Other Protein Products Fatty cuts of meat. Ribs, chicken wings, bacon, sausage,  bologna, salami, chitterlings, fatback, hot dogs, bratwurst, and packaged luncheon meats. Salted nuts and seeds. Canned beans with salt.  Dairy Whole or 2% milk, cream, half-and-half, and cream cheese. Whole-fat or sweetened yogurt. Full-fat cheeses or blue cheese. Nondairy creamers and whipped toppings. Processed cheese, cheese spreads, or cheese curds.  Condiments Onion and garlic salt, seasoned salt, table salt, and sea salt. Canned and packaged gravies. Worcestershire sauce. Tartar sauce. Barbecue sauce. Teriyaki sauce. Soy sauce, including reduced sodium. Steak sauce. Fish sauce. Oyster sauce. Cocktail sauce. Horseradish. Ketchup and mustard. Meat flavorings and tenderizers. Bouillon cubes. Hot sauce. Tabasco sauce. Marinades. Taco seasonings. Relishes.  Fats and Oils Butter, stick margarine, lard, shortening, ghee, and bacon fat. Coconut, palm kernel, or palm oils. Regular salad dressings.  Other Pickles and olives. Salted popcorn and pretzels.  The items listed above may not be a complete list of foods and beverages to avoid. Contact your dietitian for more information.  WHERE CAN I FIND MORE INFORMATION? [employer]: www.nhlbi.nih.gov/health/health-topics/topics/dash/ Document Released: 11/26/2011 Document Revised: 04/23/2014 Document Reviewed: 10/11/2013 [employer] Patient Information 2015 [employer]. This information is not intended to replace advice given to you by your health care provider. Make sure you discuss any questions you have with your health care provider.   I think that you would greatly benefit from seeing a nutritionist.  If you are interested, please call Dr. Sykes at [phone] to schedule an appointment.  

## 2022-12-09 NOTE — Progress Notes (Signed)
Subjective:  Patient ID: [name], female    DOB: [birth year], 71 y.o.   MRN: [id number]  Patient Care Team: Ivy Lynn, NP as PCP - General (Nurse Practitioner) Lorretta Harp, MD as PCP - Cardiology (Cardiology) Annia Belt, MD as Consulting Physician (Oncology) [name], MD (Inactive) as Consulting Physician (Vascular Surgery)   Chief Complaint:  Hospitalization Follow-up (11/27/22-11/29/22 [facility] - Hypertensive emergency//)   HPI: [name] is a 71 y.o. female presenting on 12/09/2022 for Hospitalization Follow-up (11/27/22-11/29/22 [facility] - Hypertensive emergency//)   [name] is a 71 y.o. female with a history of CAD, GERD, HLD, HTN, PVD with R AKA, polycythemia vera, paroxysmal a-fib, and prior stroke and MI. She is here today for hospital discharge follow up. She went to the ED on 11/27/22 for chest pain. Pain had resolved prior to arrival but she had been having intermittent chest pain for 4-5 days with some shortness of breath during these episodes. EMS noted pt to be significantly hypertensive and advised her to go to the ED. ED course: APTT was 44, Troponin was 41 on arrival and increased to 74. CBC showed WBC 15.0, hgb 9.1, platelets 472. BMP showed BUN 31, Cr 1.1, GFR 54, Ca 8.7 no recorded albumin. CXR showed Cardiomegaly with pulmonary vascular congestion. Interstitial prominence bilaterally, possible edema or infiltrate. ECG showed LVH. She is being admitted for observation due to increasing troponin. She was admitted for hypertensive emergency and elevated troponin. Troponin did not trend up and was not concerning for ACS. Pt was discharged home and states she has been doing well since home. She has follow up with cardiology scheduled.        Relevant past medical, surgical, family, and social history reviewed and updated as indicated.  Allergies and medications reviewed and updated. Data reviewed: Chart in Epic.   Past Medical History:   Diagnosis Date   Arthritis    "fingers, some in my knees" (01/28/2016)   CAD (coronary artery disease), per cath 04/27/13, non obstructive disease, EF 65-70% 05/11/2013   Carotid disease, bilateral ([place]) 09/20/2013   Lt carotid 50-69% stentosis, less on rt   Dysrhythmia    Fatty liver    GERD (gastroesophageal reflux disease)    Glaucoma    H/O cardiovascular stress test 12/27/2009   negative for ischemia   Headache    "occasional since eye pressure regulated" (01/28/2016)   Hyperlipidemia    Hypertension    Leukocytosis 07/15/2015   Migraine    "stopped w/laser holes to relieve pressure in my eyes; had them 3-4 times/wk before taht" (01/28/2016)   Mitral regurgitation,2+ by cath 05/11/2013   NSTEMI (non-ST elevated myocardial infarction) ([place]) 04/27/2013   Pain    BOTH KNEES - PT HAS TORN MENISCUS LEFT KNEE   Paroxysmal atrial fibrillation (HCC)    hx of a fib on ASA  AND ELIQUIS    Polycythemia vera ([place]) 08/20/2015   JAK-2 positive 07/19/15   PVD (peripheral vascular disease) with claudication ([place]) 07/21/2006   previous L ext iliac stent and rt SFA stent, known occluded Lt SFA   S/P cardiac cath 04/27/2013   NON OBSTRUCTIVE DISEASE, 2+ mr, ef 65-70%   Statin intolerance    Stroke ([place]) 12/21/2004   SWELLING ALL OVER AND RT SIDE OF FACE DRAWN AND SPEECH SLURRED AND NUMBNESS ON RIGHT SIDE-- ALL RESOLVED   TIA (transient ischemic attack)     Past Surgical History:  Procedure Laterality Date  AMPUTATION Right 05/21/2017   Procedure: AMPUTATION ABOVE KNEE;  Surgeon: Rosetta Posner, MD;  Location: [facility];  Service: Vascular;  Laterality: Right;   CARDIAC CATHETERIZATION  04/27/13   non occlusive disease with mild to mod. calcified lesions in ostial LM and proximal LAD and moderate prox. RC AND DISTAL AV GROOVE LCX, ef   CATARACT EXTRACTION W/PHACO Right 03/21/2015   Procedure: CATARACT EXTRACTION PHACO AND INTRAOCULAR LENS PLACEMENT RIGHT EYE;  Surgeon: Baruch Goldmann, MD;  Location: [facility]  ORS;  Service: Ophthalmology;  Laterality: Right;  CDE:5.10   CATARACT EXTRACTION W/PHACO Left 05/16/2015   Procedure: CATARACT EXTRACTION PHACO AND INTRAOCULAR LENS PLACEMENT (IOC);  Surgeon: Baruch Goldmann, MD;  Location: [facility] ORS;  Service: Ophthalmology;  Laterality: Left;  CDE 5.57   CESAREAN SECTION  1977   COLONOSCOPY N/A 09/11/2016   Procedure: COLONOSCOPY;  Surgeon: Rogene Houston, MD;  Location: [facility] ENDO SUITE;  Service: Endoscopy;  Laterality: N/A;  730-moved to 1:00 Ann notified pt   EMBOLECTOMY Right 02/01/2016   Procedure: Thrombectomy of Right Femoral-Popliteal Bypass Graft; Endarterectomy of Right Below Knee Popliteal Artery and Tibial Peroneal Trunk with Bovine Pericardium Patch Angioplasty.;  Surgeon: Angelia Mould, MD;  Location: [place];  Service: Vascular;  Laterality: Right;   FEMORAL-POPLITEAL BYPASS GRAFT Right 01/31/2016   Procedure: BYPASS GRAFT RIGHT COMMON  FEMORALTO BELOW KNEE POPLITEAL ARTERY BYPASS GRAFT USING 6MM PROPATEN GORTEX GRAFT;  Surgeon: Mal Misty, MD;  Location: [place];  Service: Vascular;  Laterality: Right;   FEMORAL-POPLITEAL BYPASS GRAFT Right 01/09/2017   Procedure: THROMBECTOMY OF RIGHT FEMORAL-POPLITEAL  ARTERY BYPASS GRAFT; THROMBECTOMY RIGHT  TIBIAL VESSELS;  Surgeon: [name], MD;  Location: [place];  Service: Vascular;  Laterality: Right;   FEMORAL-POPLITEAL BYPASS GRAFT Right 05/17/2017   Procedure: RIGHT FEMORAL-TIBIAL PERONEAL TRUNK ARTERY BYPASS GRAFT USING 34mX80cm PROPATEN GRAFT WITH REMOVABLE RING;  Surgeon: ERosetta Posner MD;  [place] [facility]  Service: Vascular;  Laterality: Right;   FRACTURE SURGERY     ILIAC ARTERY STENT  07/2006   external iliac and Rt SFA stent 07/2006 AND THE LEFT WAS IN 2006   KNEE ARTHROSCOPY WITH MEDIAL MENISECTOMY Left 03/27/2014   Procedure: left knee arthorscopy with medial chondraplasty of the medial femoral and patella, medial microfracture technique of medial femoral condyl;  Surgeon: RTobi Bastos  MD;  Location: [facility];  Service: Orthopedics;  Laterality: Left;   LEFT HEART CATHETERIZATION WITH CORONARY ANGIOGRAM Right 04/27/2013   Procedure: LEFT HEART CATHETERIZATION WITH CORONARY ANGIOGRAM;  Surgeon: DLeonie Man MD;  [facility];  Service: Cardiovascular;  Laterality: Right;   LOWER EXTREMITY ANGIOGRAM Right 01/31/2016   Procedure: INTRAOP RIGHT LOWER EXTREMITY ANGIOGRAM;  Surgeon: JMal Misty MD;  [place] [facility]  Service: Vascular;  Laterality: Right;   ORIF WRIST FRACTURE Right 1983   OVARIAN CYST REMOVAL Right    PATCH ANGIOPLASTY Right 01/31/2016   Procedure: RIGHT COMMON FEMORAL AND PROFUNDA FEMORIS ENDARECTOMY WITH PATCH ANGIOPLASTY;  Surgeon: JMal Misty MD;  [place] [facility]  Service: Vascular;  Laterality: Right;   PATCH ANGIOPLASTY Right 01/09/2017   Procedure: PATCH ANGIOPLASTY RIGHT POPLITEAL ARTERY BYPASS GRAFT;  Surgeon: CElam Dutch MD;  [facility]  Service: Vascular;  Laterality: Right;   PERIPHERAL VASCULAR CATHETERIZATION N/A 06/27/2015   Procedure: Lower Extremity Angiography;  Surgeon: JLorretta Harp MD;  [facility];  Service: Cardiovascular;  Laterality: N/A;   PERIPHERAL VASCULAR CATHETERIZATION Bilateral 01/29/2016  Procedure: Lower Extremity Angiography;  Surgeon: Wellington Hampshire, MD;  Location: [facility] [facility] LAB;  Service: Cardiovascular;  Laterality: Bilateral;   PERIPHERAL VASCULAR CATHETERIZATION N/A 01/29/2016   Procedure: Abdominal Aortogram;  Surgeon: Wellington Hampshire, MD;  Location: [place] [facility];  Service: Cardiovascular;  Laterality: N/A;   REFRACTIVE SURGERY Bilateral    "6 in one eye; 7 in the other; to relieve pressure; not glaucoma" (01/28/2016)   SFA Right 06/27/2015   overlapping Bahn covered stents   [place] Left 05/17/2017   Procedure: LEFT LEG GREATER SAPHENOUS VEIN HARVEST;  Surgeon: Rosetta Posner, MD;  Location: [place];  Service: Vascular;  Laterality: Left;    VEIN REPAIR Right 01/31/2016   Procedure: RIGHT GREATER SAPHENOUS VEIN EXAMNED BUT NOT REMOVED;  Surgeon: Mal Misty, MD;  Location: [facility];  Service: Vascular;  Laterality: Right;    Social History   Socioeconomic History   Marital status: Widowed    Spouse name: Not on file   Number of children: 2   Years of education: 57   Highest education level: Not on file  Occupational History   Occupation: CNA  Tobacco Use   Smoking status: Former    Packs/day: 1.00    Years: 45.00    Total pack years: 45.00    Types: Cigarettes    Quit date: 11/11/2012    Years since quitting: 10.0   Smokeless tobacco: Never   Tobacco comments:    quit 4 years ago.    10/27/21-Pt declines lung ca screen at this time.   Vaping Use   Vaping Use: Never used  Substance and Sexual Activity   Alcohol use: No    Alcohol/week: 0.0 standard drinks of alcohol   Drug use: No   Sexual activity: Not Currently    Birth control/protection: None  Other Topics Concern   Not on file  Social History Narrative   Husband passed away after CVA in 03-03-2005.   2 children, 5 grandchildren and 1 great grandchild.   Social Determinants of Health   Financial Resource Strain: Low Risk  (11/27/2022)   Overall Financial Resource Strain (CARDIA)    Difficulty of Paying Living Expenses: Not hard at all  Food Insecurity: No Food Insecurity (11/28/2022)   Hunger Vital Sign    Worried About Running Out of Food in the Last Year: Never true    Ran Out of Food in the Last Year: Never true  Transportation Needs: No Transportation Needs (11/28/2022)   PRAPARE - Hydrologist (Medical): No    Lack of Transportation (Non-Medical): No  Physical Activity: Insufficiently Active (11/27/2022)   Exercise Vital Sign    Days of Exercise per Week: 3 days    Minutes of Exercise per Session: 30 min  Stress: No Stress Concern Present (11/27/2022)   [place]    Feeling of Stress : Not at all  Social Connections: Socially Isolated (11/27/2022)   Social Connection and Isolation Panel [NHANES]    Frequency of Communication with Friends and Family: More than three times a week    Frequency of Social Gatherings with Friends and Family: More than three times a week    Attends Religious Services: Never    Marine scientist or Organizations: No    Attends Archivist Meetings: Never    Marital Status: Widowed  Intimate Partner Violence: Not At  Risk (11/28/2022)   Humiliation, Afraid, Rape, and Kick questionnaire    Fear of Current or Ex-Partner: No    Emotionally Abused: No    Physically Abused: No    Sexually Abused: No    Outpatient Encounter Medications as of 12/09/2022  Medication Sig   acetaminophen (TYLENOL) 500 MG tablet Take 2 tablets (1,000 mg total) by mouth every 6 (six) hours.   ARTIFICIAL TEAR OP Place 1 drop into both eyes as needed (for dry eyes).   aspirin EC 325 MG tablet Take 325 mg by mouth daily.   diclofenac Sodium (VOLTAREN) 1 % GEL Apply 2 g topically 4 (four) times daily. (Patient taking differently: Apply 2 g topically 4 (four) times daily as needed (pain).)   diltiazem (CARDIZEM CD) 240 MG 24 hr capsule TAKE 1 CAPSULE EVERY MORNING   ELIQUIS 5 MG TABS tablet TAKE ONE TABLET TWICE DAILY (Patient taking differently: Take 5 mg by mouth 2 (two) times daily.)   escitalopram (LEXAPRO) 20 MG tablet TAKE ONE TABLET ONCE DAILY (Patient taking differently: Take 20 mg by mouth daily.)   ezetimibe (ZETIA) 10 MG tablet Take 1 tablet (10 mg total) by mouth daily.   fenofibrate (TRICOR) 145 MG tablet TAKE ONE TABLET ONCE DAILY   flecainide (TAMBOCOR) 50 MG tablet TAKE ONE TABLET TWICE DAILY   hydroxyurea (HYDREA) 500 MG capsule TAKE 1 CAPSULE WITH FOOD EVERY DAY EXCEPT SUNDAYS (Patient taking differently: Take 500 mg by mouth See admin instructions. Take 1 capsule with food daily except on Sundays.)    omeprazole (PRILOSEC) 20 MG capsule TAKE ONE CAPSULE TWICE DAILY BEFORE MEALS (Patient taking differently: Take 20 mg by mouth 2 (two) times daily before a meal. TAKE ONE CAPSULE TWICE DAILY BEFORE MEALS)   traMADol (ULTRAM) 50 MG tablet Take 1 tablet (50 mg total) by mouth daily as needed for moderate pain.   valsartan (DIOVAN) 160 MG tablet Take 160 mg every afternoon   valsartan (DIOVAN) 320 MG tablet Take 320 mg every morning   No facility-administered encounter medications on file as of 12/09/2022.    Allergies  Allergen Reactions   Atenolol Rash and Itching   Demerol  [Meperidine Hcl] Itching   Demerol [Meperidine] Other (See Comments) and Itching    Unknown, can't remember  Unknown, can't remember    Gabapentin Anxiety and Other (See Comments)    SI and HI   Inderal [Propranolol] Other (See Comments) and Itching    Doesn't remember    Prednisone Other (See Comments)    Muscle spasms   Statins Itching and Other (See Comments)    Simvastatin-caused severe itching Pravastatin-caused lesser tiching One type of statin? Caused stroke in 2006, and other symptoms as result   Warfarin And Related Other (See Comments)    Caused nose bleeds   Crestor [Rosuvastatin] Itching and Rash   Docosahexaenoic Acid (Dha)     Other reaction(s): Other (See Comments) nosebleeds   Lactose Intolerance (Gi) Other (See Comments)    Bloating, gas   Lipitor [Atorvastatin] Rash   Lovaza [Omega-3-Acid Ethyl Esters] Other (See Comments)    nosebleeds    Penicillins Hives, Rash and Other (See Comments)    Has patient had a PCN reaction causing immediate rash, facial/tongue/throat swelling, SOB or lightheadedness with hypotension: Yes Has patient had a PCN reaction causing severe rash involving mucus membranes or skin necrosis: No Has patient had a PCN reaction that required hospitalization: No Has patient had a PCN reaction occurring within the last 10  years: No If all of the above answers are "NO",  then may proceed with Cephalosporin use.  Questionable high fever    Pravastatin Itching and Rash   Simvastatin Itching, Rash and Other (See Comments)   Trilipix [Choline Fenofibrate] Other (See Comments)    Doesn't remember    Warfarin Other (See Comments)    nosebleeds   Hydralazine Swelling   Effexor [Venlafaxine Hcl] Other (See Comments)    Doesn't remember    Nexium [Esomeprazole Magnesium] Rash   Percocet [Oxycodone-Acetaminophen] Other (See Comments)    Doesn't remember    Plavix [Clopidogrel Bisulfate] Rash    Review of Systems  Constitutional:  Positive for activity change and fatigue. Negative for appetite change, chills, diaphoresis, fever and unexpected weight change.  HENT: Negative.    Eyes: Negative.  Negative for photophobia and visual disturbance.  Respiratory:  Negative for cough, chest tightness and shortness of breath.   Cardiovascular:  Negative for chest pain, palpitations and leg swelling.  Gastrointestinal:  Negative for abdominal pain, blood in stool, constipation, diarrhea, nausea and vomiting.  Endocrine: Negative.   Genitourinary:  Negative for decreased urine volume, difficulty urinating, dysuria, frequency and urgency.  Musculoskeletal:  Positive for gait problem (right AKA, uses walker). Negative for arthralgias and myalgias.  Skin: Negative.   Allergic/Immunologic: Negative.   Neurological:  Negative for dizziness, weakness and headaches.  Hematological: Negative.   Psychiatric/Behavioral:  Negative for confusion, hallucinations, sleep disturbance and suicidal ideas.   All other systems reviewed and are negative.       Objective:  BP (!) 155/71   Pulse 95   Temp 97.9 F (36.6 C) (Temporal)   Ht _0  (1.626 m)   Wt 134 lb 6.4 oz (61 kg)   SpO2 94%   BMI 23.07 kg/m    Wt Readings from Last 3 Encounters:  12/09/22 134 lb 6.4 oz (61 kg)  11/27/22 136 lb (61.7 kg)  11/27/22 136 lb (61.7 kg)    Physical Exam Vitals and nursing note  reviewed.  Constitutional:      General: She is not in acute distress.    Appearance: She is ill-appearing (chronically). She is not toxic-appearing or diaphoretic.  HENT:     Head: Normocephalic and atraumatic.     Mouth/Throat:     Mouth: Mucous membranes are moist.  Eyes:     Conjunctiva/sclera: Conjunctivae normal.     Pupils: Pupils are equal, round, and reactive to light.  Neck:     Vascular: Carotid bruit present.  Cardiovascular:     Rate and Rhythm: Normal rate and regular rhythm.     Pulses:          Radial pulses are 2+ on the right side and 1+ on the left side.     Heart sounds: Normal heart sounds.  Pulmonary:     Effort: Pulmonary effort is normal.     Breath sounds: Normal breath sounds.  Abdominal:     General: Bowel sounds are normal.     Palpations: Abdomen is soft.  Musculoskeletal:     Cervical back: Neck supple.     Left lower leg: No edema.     Comments: Right AKA, prothesis  Skin:    General: Skin is warm and dry.     Capillary Refill: Capillary refill takes less than 2 seconds.  Neurological:     General: No focal deficit present.     Mental Status: She is alert and oriented to person, place, and time.  Gait: Gait abnormal (right AKA with prosthesis, using walker).  Psychiatric:        Mood and Affect: Mood normal.        Behavior: Behavior normal. Behavior is cooperative.        Thought Content: Thought content normal.        Judgment: Judgment normal.     Results for orders placed or performed during the hospital encounter of 32/20/25  Basic metabolic panel  Result Value Ref Range   Sodium 139 135 - 145 mmol/L   Potassium 5.0 3.5 - 5.1 mmol/L   Chloride 109 98 - 111 mmol/L   CO2 21 (L) 22 - 32 mmol/L   Glucose, Bld 94 70 - 99 mg/dL   BUN 31 (H) 8 - 23 mg/dL   Creatinine, Ser 1.10 (H) 0.44 - 1.00 mg/dL   Calcium 8.7 (L) 8.9 - 10.3 mg/dL   GFR, Estimated 54 (L) >60 mL/min   Anion gap 9 5 - 15  CBC  Result Value Ref Range   WBC 15.0  (H) 4.0 - 10.5 K/uL   RBC 3.87 3.87 - 5.11 MIL/uL   Hemoglobin 9.1 (L) 12.0 - 15.0 g/dL   HCT 32.0 (L) 36.0 - 46.0 %   MCV 82.7 80.0 - 100.0 fL   MCH 23.5 (L) 26.0 - 34.0 pg   MCHC 28.4 (L) 30.0 - 36.0 g/dL   RDW 18.6 (H) 11.5 - 15.5 %   Platelets 472 (H) 150 - 400 K/uL   nRBC 0.1 0.0 - 0.2 %  APTT  Result Value Ref Range   aPTT 44 (H) 24 - 36 seconds  Protime-INR  Result Value Ref Range   Prothrombin Time 15.0 11.4 - 15.2 seconds   INR 1.2 0.8 - 1.2  Urinalysis, Routine w reflex microscopic Urine, Clean Catch  Result Value Ref Range   Color, Urine STRAW (A) YELLOW   APPearance CLEAR CLEAR   Specific Gravity, Urine 1.007 1.005 - 1.030   pH 7.0 5.0 - 8.0   Glucose, UA 50 (A) NEGATIVE mg/dL   Hgb urine dipstick NEGATIVE NEGATIVE   Bilirubin Urine NEGATIVE NEGATIVE   Ketones, ur NEGATIVE NEGATIVE mg/dL   Protein, ur >=300 (A) NEGATIVE mg/dL   Nitrite NEGATIVE NEGATIVE   Leukocytes,Ua SMALL (A) NEGATIVE   WBC, UA 6-10 0 - 5 WBC/hpf   Bacteria, UA NONE SEEN NONE SEEN   Squamous Epithelial / LPF 0-5 0 - 5  Comprehensive metabolic panel  Result Value Ref Range   Sodium 139 135 - 145 mmol/L   Potassium 4.1 3.5 - 5.1 mmol/L   Chloride 111 98 - 111 mmol/L   CO2 20 (L) 22 - 32 mmol/L   Glucose, Bld 123 (H) 70 - 99 mg/dL   BUN 30 (H) 8 - 23 mg/dL   Creatinine, Ser 1.27 (H) 0.44 - 1.00 mg/dL   Calcium 8.5 (L) 8.9 - 10.3 mg/dL   Total Protein 6.1 (L) 6.5 - 8.1 g/dL   Albumin 3.1 (L) 3.5 - 5.0 g/dL   AST 18 15 - 41 U/L   ALT 12 0 - 44 U/L   Alkaline Phosphatase 57 38 - 126 U/L   Total Bilirubin 0.4 0.3 - 1.2 mg/dL   GFR, Estimated 45 (L) >60 mL/min   Anion gap 8 5 - 15  Magnesium  Result Value Ref Range   Magnesium 1.4 (L) 1.7 - 2.4 mg/dL  CBC with Differential/Platelet  Result Value Ref Range   WBC 11.7 (H) 4.0 -  10.5 K/uL   RBC 3.74 (L) 3.87 - 5.11 MIL/uL   Hemoglobin 8.8 (L) 12.0 - 15.0 g/dL   HCT 31.0 (L) 36.0 - 46.0 %   MCV 82.9 80.0 - 100.0 fL   MCH 23.5 (L) 26.0 -  34.0 pg   MCHC 28.4 (L) 30.0 - 36.0 g/dL   RDW 18.5 (H) 11.5 - 15.5 %   Platelets 400 150 - 400 K/uL   nRBC 0.0 0.0 - 0.2 %   Neutrophils Relative % 82 %   Neutro Abs 9.7 (H) 1.7 - 7.7 K/uL   Lymphocytes Relative 10 %   Lymphs Abs 1.1 0.7 - 4.0 K/uL   Monocytes Relative 2 %   Monocytes Absolute 0.3 0.1 - 1.0 K/uL   Eosinophils Relative 4 %   Eosinophils Absolute 0.4 0.0 - 0.5 K/uL   Basophils Relative 1 %   Basophils Absolute 0.1 0.0 - 0.1 K/uL   RBC Morphology ANISOCYTOSIS    Smear Review MORPHOLOGY UNREMARKABLE    Immature Granulocytes 1 %   Abs Immature Granulocytes 0.14 (H) 0.00 - 0.07 K/uL   Polychromasia PRESENT   TSH  Result Value Ref Range   TSH 3.729 0.350 - 4.500 uIU/mL  Basic metabolic panel  Result Value Ref Range   Sodium 138 135 - 145 mmol/L   Potassium 5.0 3.5 - 5.1 mmol/L   Chloride 112 (H) 98 - 111 mmol/L   CO2 22 22 - 32 mmol/L   Glucose, Bld 99 70 - 99 mg/dL   BUN 32 (H) 8 - 23 mg/dL   Creatinine, Ser 1.23 (H) 0.44 - 1.00 mg/dL   Calcium 8.2 (L) 8.9 - 10.3 mg/dL   GFR, Estimated 47 (L) >60 mL/min   Anion gap 4 (L) 5 - 15  Magnesium  Result Value Ref Range   Magnesium 2.2 1.7 - 2.4 mg/dL  Troponin I (High Sensitivity)  Result Value Ref Range   Troponin I (High Sensitivity) 41 (H) <18 ng/L  Troponin I (High Sensitivity)  Result Value Ref Range   Troponin I (High Sensitivity) 74 (H) <18 ng/L  Troponin I (High Sensitivity)  Result Value Ref Range   Troponin I (High Sensitivity) 76 (H) <18 ng/L       Pertinent labs & imaging results that were available during my care of the patient were reviewed by me and considered in my medical decision making.  Assessment & Plan:  [name] was seen today for hospitalization follow-up.  Diagnoses and all orders for this visit:  Hospital discharge follow-up Today's visit was for Transitional Care Management. The patient was discharged from [facility] on 11/29/2022 with a primary diagnosis of hypertensive  emergency.  Contact with the patient and/or caregiver, by a clinical staff member, was made on 11/30/2022 and was documented as a telephone encounter within the EMR. Through chart review and discussion with the patient I have determined that management of their condition is of high complexity.   -     CBC with Differential/Platelet -     CMP14+EGFR -     Magnesium  Hypertensive emergency PAF (paroxysmal atrial fibrillation) (HCC) Bilateral carotid artery stenosis PVD (peripheral vascular disease) ([place]) Unilateral AKA, right ([place]) Aortic atherosclerosis ([place])  Has follow up with cardiology scheduled. Follow up labs ordered.  -     CBC with Differential/Platelet -     CMP14+EGFR -     Magnesium  Polycythemia vera ([place]) Does get therapeutic phlebotomies as needed.  -     CBC with  Differential/Platelet -     CMP14+EGFR -     Magnesium  GAD (generalized anxiety disorder) Doing well on current regimen, will continue.  -     CBC with Differential/Platelet -     CMP14+EGFR -     Magnesium     Continue all other maintenance medications.  Follow up plan: Return if symptoms worsen or fail to improve.   Continue healthy lifestyle choices, including diet (rich in fruits, vegetables, and lean proteins, and low in salt and simple carbohydrates) and exercise (at least 30 minutes of moderate physical activity daily).  Educational handout given for DASH diet, HTN  The above assessment and management plan was discussed with the patient. The patient verbalized understanding of and has agreed to the management plan. Patient is aware to call the clinic if they develop any new symptoms or if symptoms persist or worsen. Patient is aware when to return to the clinic for a follow-up visit. Patient educated on when it is appropriate to go to the emergency department.   Monia Pouch, FNP-C [facility] [phone]

## 2022-12-10 ENCOUNTER — Ambulatory Visit (HOSPITAL_COMMUNITY)
Admission: RE | Admit: 2022-12-10 | Discharge: 2022-12-10 | Disposition: A | Discharge status: Home / Self Care | Payer: 59 | Source: Ambulatory Visit | Attending: Internal Medicine | Admitting: Internal Medicine

## 2022-12-10 ENCOUNTER — Ambulatory Visit (HOSPITAL_BASED_OUTPATIENT_CLINIC_OR_DEPARTMENT_OTHER)
Admission: RE | Admit: 2022-12-10 | Discharge: 2022-12-10 | Disposition: A | Discharge status: Home / Self Care | Payer: 59 | Source: Ambulatory Visit | Attending: Cardiovascular Disease | Admitting: Cardiovascular Disease

## 2022-12-10 DIAGNOSIS — Z95828 Presence of other vascular implants and grafts: Secondary | ICD-10-CM

## 2022-12-10 DIAGNOSIS — I739 Peripheral vascular disease, unspecified: Secondary | ICD-10-CM | POA: Insufficient documentation

## 2022-12-10 LAB — CBC WITH DIFFERENTIAL/PLATELET
Basophils Absolute: 0.2 10*3/uL (ref 0.0–0.2)
Basos: 1 %
EOS (ABSOLUTE): 0.4 10*3/uL (ref 0.0–0.4)
Eos: 3 %
Hematocrit: 31.1 % — ABNORMAL LOW (ref 34.0–46.6)
Hemoglobin: 9.2 g/dL — ABNORMAL LOW (ref 11.1–15.9)
Immature Grans (Abs): 0.1 10*3/uL (ref 0.0–0.1)
Immature Granulocytes: 1 %
Lymphocytes Absolute: 0.9 10*3/uL (ref 0.7–3.1)
Lymphs: 6 %
MCH: 23.1 pg — ABNORMAL LOW (ref 26.6–33.0)
MCHC: 29.6 g/dL — ABNORMAL LOW (ref 31.5–35.7)
MCV: 78 fL — ABNORMAL LOW (ref 79–97)
Monocytes Absolute: 0.3 10*3/uL (ref 0.1–0.9)
Monocytes: 2 %
Neutrophils Absolute: 11.6 10*3/uL — ABNORMAL HIGH (ref 1.4–7.0)
Neutrophils: 87 %
Platelets: 383 10*3/uL (ref 150–450)
RBC: 3.98 x10E6/uL (ref 3.77–5.28)
RDW: 17.6 % — ABNORMAL HIGH (ref 11.7–15.4)
WBC: 13.4 10*3/uL — ABNORMAL HIGH (ref 3.4–10.8)

## 2022-12-10 LAB — CMP14+EGFR
ALT: 12 IU/L (ref 0–32)
AST: 15 IU/L (ref 0–40)
Albumin/Globulin Ratio: 1.6 (ref 1.2–2.2)
Albumin: 3.8 g/dL (ref 3.8–4.8)
Alkaline Phosphatase: 74 IU/L (ref 44–121)
BUN/Creatinine Ratio: 18 (ref 12–28)
BUN: 25 mg/dL (ref 8–27)
Bilirubin Total: 0.2 mg/dL (ref 0.0–1.2)
CO2: 16 mmol/L — ABNORMAL LOW (ref 20–29)
Calcium: 8.4 mg/dL — ABNORMAL LOW (ref 8.7–10.3)
Chloride: 110 mmol/L — ABNORMAL HIGH (ref 96–106)
Creatinine, Ser: 1.36 mg/dL — ABNORMAL HIGH (ref 0.57–1.00)
Globulin, Total: 2.4 g/dL (ref 1.5–4.5)
Glucose: 117 mg/dL — ABNORMAL HIGH (ref 70–99)
Potassium: 5.5 mmol/L — ABNORMAL HIGH (ref 3.5–5.2)
Sodium: 140 mmol/L (ref 134–144)
Total Protein: 6.2 g/dL (ref 6.0–8.5)
eGFR: 42 mL/min/{1.73_m2} — ABNORMAL LOW (ref >59)

## 2022-12-10 LAB — MAGNESIUM: Magnesium: 1.3 mg/dL — ABNORMAL LOW (ref 1.6–2.3)

## 2022-12-11 ENCOUNTER — Other Ambulatory Visit: Payer: Self-pay

## 2022-12-11 DIAGNOSIS — I739 Peripheral vascular disease, unspecified: Secondary | ICD-10-CM

## 2022-12-16 ENCOUNTER — Telehealth: Payer: Self-pay

## 2022-12-16 NOTE — Telephone Encounter (Signed)
Left message for pt to call back regarding recent dopplers and message from Dr. [name] regarding recent visit to the ED for hypertension. Per Dr. [name] pt needs sooner follow up.

## 2022-12-17 ENCOUNTER — Other Ambulatory Visit: Payer: Self-pay | Admitting: Cardiovascular Disease

## 2022-12-17 NOTE — Telephone Encounter (Signed)
Called patient, advised of recent testing (mailed out yesterday as well per RN) patient scheduled for follow up with Coletta Memos, NP on 12/31/22 to follow up from HTN ED visit.   Thanks!

## 2022-12-17 NOTE — Telephone Encounter (Signed)
Patient returning call.

## 2022-12-18 ENCOUNTER — Other Ambulatory Visit: Payer: Self-pay | Admitting: Nurse Practitioner

## 2022-12-18 DIAGNOSIS — Z8673 Personal history of transient ischemic attack (TIA), and cerebral infarction without residual deficits: Secondary | ICD-10-CM

## 2022-12-18 DIAGNOSIS — I214 Non-ST elevation (NSTEMI) myocardial infarction: Secondary | ICD-10-CM

## 2022-12-18 DIAGNOSIS — K219 Gastro-esophageal reflux disease without esophagitis: Secondary | ICD-10-CM

## 2022-12-25 ENCOUNTER — Ambulatory Visit (INDEPENDENT_AMBULATORY_CARE_PROVIDER_SITE_OTHER): Payer: 59 | Admitting: Nurse Practitioner

## 2022-12-25 ENCOUNTER — Encounter: Payer: Self-pay | Admitting: Nurse Practitioner

## 2022-12-25 VITALS — BP 135/69 | HR 91 | Temp 98.7°F | Ht 64.0 in | Wt 134.0 lb

## 2022-12-25 DIAGNOSIS — K219 Gastro-esophageal reflux disease without esophagitis: Secondary | ICD-10-CM | POA: Diagnosis not present

## 2022-12-25 DIAGNOSIS — M5136 Other intervertebral disc degeneration, lumbar region: Secondary | ICD-10-CM | POA: Diagnosis not present

## 2022-12-25 DIAGNOSIS — I10 Essential (primary) hypertension: Secondary | ICD-10-CM

## 2022-12-25 MED ORDER — TRAMADOL HCL 50 MG PO TABS: 50.0000 mg | ORAL_TABLET | Freq: Two times a day (BID) | ORAL | 0 refills | Status: DC

## 2022-12-25 NOTE — Assessment & Plan Note (Signed)
Symptoms are well controlled 

## 2022-12-25 NOTE — Patient Instructions (Signed)
Hypertension, Adult ?Hypertension is another name for high blood pressure. High blood pressure forces your heart to work harder to pump blood. This can cause problems over time. ?There are two numbers in a blood pressure reading. There is a top number (systolic) over a bottom number (diastolic). It is best to have a blood pressure that is below 120/80. ?What are the causes? ?The cause of this condition is not known. Some other conditions can lead to high blood pressure. ?What increases the risk? ?Some lifestyle factors can make you more likely to develop high blood pressure: ?Smoking. ?Not getting enough exercise or physical activity. ?Being overweight. ?Having too much fat, sugar, calories, or salt (sodium) in your diet. ?Drinking too much alcohol. ?Other risk factors include: ?Having any of these conditions: ?Heart disease. ?Diabetes. ?High cholesterol. ?Kidney disease. ?Obstructive sleep apnea. ?Having a family history of high blood pressure and high cholesterol. ?Age. The risk increases with age. ?Stress. ?What are the signs or symptoms? ?High blood pressure may not cause symptoms. Very high blood pressure (hypertensive crisis) may cause: ?Headache. ?Fast or uneven heartbeats (palpitations). ?Shortness of breath. ?Nosebleed. ?Vomiting or feeling like you may vomit (nauseous). ?Changes in how you see. ?Very bad chest pain. ?Feeling dizzy. ?Seizures. ?How is this treated? ?This condition is treated by making healthy lifestyle changes, such as: ?Eating healthy foods. ?Exercising more. ?Drinking less alcohol. ?Your doctor may prescribe medicine if lifestyle changes do not help enough and if: ?Your top number is above 130. ?Your bottom number is above 80. ?Your personal target blood pressure may vary. ?Follow these instructions at home: ?Eating and drinking ? ?If told, follow the DASH eating plan. To follow this plan: ?Fill one half of your plate at each meal with fruits and vegetables. ?Fill one fourth of your plate  at each meal with whole grains. Whole grains include whole-wheat pasta, brown rice, and whole-grain bread. ?Eat or drink low-fat dairy products, such as skim milk or low-fat yogurt. ?Fill one fourth of your plate at each meal with low-fat (lean) proteins. Low-fat proteins include fish, chicken without skin, eggs, beans, and tofu. ?Avoid fatty meat, cured and processed meat, or chicken with skin. ?Avoid pre-made or processed food. ?Limit the amount of salt in your diet to less than 1,500 mg each day. ?Do not drink alcohol if: ?Your doctor tells you not to drink. ?You are pregnant, may be pregnant, or are planning to become pregnant. ?If you drink alcohol: ?Limit how much you have to: ?0-1 drink a day for women. ?0-2 drinks a day for men. ?Know how much alcohol is in your drink. In the [place] one drink equals one 12 oz bottle of beer (355 mL), one 5 oz glass of wine (148 mL), or one 1? oz glass of hard liquor (44 mL). ?Lifestyle ? ?Work with your doctor to stay at a healthy weight or to lose weight. Ask your doctor what the best weight is for you. ?Get at least 30 minutes of exercise that causes your heart to beat faster (aerobic exercise) most days of the week. This may include walking, swimming, or biking. ?Get at least 30 minutes of exercise that strengthens your muscles (resistance exercise) at least 3 days a week. This may include lifting weights or doing Pilates. ?Do not smoke or use any products that contain nicotine or tobacco. If you need help quitting, ask your doctor. ?Check your blood pressure at home as told by your doctor. ?Keep all follow-up visits. ?Medicines ?Take over-the-counter and prescription medicines   only as told by your doctor. Follow directions carefully. ?Do not skip doses of blood pressure medicine. The medicine does not work as well if you skip doses. Skipping doses also puts you at risk for problems. ?Ask your doctor about side effects or reactions to medicines that you should watch  for. ?Contact a doctor if: ?You think you are having a reaction to the medicine you are taking. ?You have headaches that keep coming back. ?You feel dizzy. ?You have swelling in your ankles. ?You have trouble with your vision. ?Get help right away if: ?You get a very bad headache. ?You start to feel mixed up (confused). ?You feel weak or numb. ?You feel faint. ?You have very bad pain in your: ?Chest. ?Belly (abdomen). ?You vomit more than once. ?You have trouble breathing. ?These symptoms may be an emergency. Get help right away. Call 911. ?Do not wait to see if the symptoms will go away. ?Do not drive yourself to the hospital. ?Summary ?Hypertension is another name for high blood pressure. ?High blood pressure forces your heart to work harder to pump blood. ?For most people, a normal blood pressure is less than 120/80. ?Making healthy choices can help lower blood pressure. If your blood pressure does not get lower with healthy choices, you may need to take medicine. ?This information is not intended to replace advice given to you by your health care provider. Make sure you discuss any questions you have with your health care provider. ?Document Revised: 09/25/2021 Document Reviewed: 09/25/2021 ?Elsevier Patient Education ? 2023 [employer] ? ?

## 2022-12-25 NOTE — Assessment & Plan Note (Addendum)
Hypertension is well controlled on current medication. No changes needed. Labs completed: CBC, CMP, LIPID,  Follow up in 3 months.
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
## 2022-12-30 NOTE — Progress Notes (Deleted)
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 tolerate Coumadin due to nosebleeds.  Compliant with apixaban and denies bleeding issues.   Continue apixaban, diltiazem Heart healthy low-sodium diet Increase physical activity as tolerated Avoid triggers caffeine, chocolate, EtOH, dehydration etc. (reviewed)   Coronary artery disease-no further episodes of chest discomfort since being seen in the emergency department.  With cardiac catheterization 5/14 which showed nonobstructive CAD. Continue aspirin, ezetimibe, fenofibrate, Heart healthy low-sodium diet  Peripheral arterial disease-continues to deny  left lower extremity claudication.  Status post right BKA by Dr. Oneida Alar 01/09/2017.  Lower extremity Dopplers 08/14/2020 showed patent left iliac stent.   Lower extremity ABIs 11/2022 were stable.   Continue ezetimibe, fenofibrate, aspirin Heart healthy low-sodium high-fiber diet  Hyperlipidemia-10/09/2022: Cholesterol, Total 162; HDL 40; LDL Chol Calc (NIH) 89; Triglycerides 192.  Statin intolerant Continue ezetimibe, fenofibrate, aspirin Heart healthy low-sodium high-fiber diet Increase physical activity as tolerated Follow with PCP    Disposition: Follow-up with Dr. [name] or me in 6 months  Jossie Ng. Selma Rodelo NP-C    12/30/2022, 7:12 AM [address] Office [phone] Fax [phone]  Notice: This dictation was prepared with Dragon dictation along with smaller phrase technology. Any transcriptional errors that result from this process are unintentional and may not be corrected upon review.  I spent 13*** minutes examining this patient, reviewing medications, and using patient centered shared decision making involving her cardiac care.  Prior to her visit I spent greater than 20 minutes reviewing her past medical history,  medications, and prior cardiac tests.

## 2022-12-31 ENCOUNTER — Ambulatory Visit: Payer: 59 | Admitting: General Practice

## 2023-01-04 ENCOUNTER — Encounter: Payer: Self-pay | Admitting: Hematology

## 2023-01-08 ENCOUNTER — Other Ambulatory Visit (HOSPITAL_BASED_OUTPATIENT_CLINIC_OR_DEPARTMENT_OTHER): Payer: Self-pay | Admitting: Cardiovascular Disease

## 2023-01-08 DIAGNOSIS — I739 Peripheral vascular disease, unspecified: Secondary | ICD-10-CM

## 2023-01-13 ENCOUNTER — Other Ambulatory Visit: Payer: Self-pay

## 2023-01-13 DIAGNOSIS — D45 Polycythemia vera: Secondary | ICD-10-CM

## 2023-01-14 ENCOUNTER — Other Ambulatory Visit: Payer: Self-pay

## 2023-01-14 ENCOUNTER — Inpatient Hospital Stay: Payer: 59

## 2023-01-14 ENCOUNTER — Encounter: Payer: Self-pay | Admitting: Hematology

## 2023-01-14 ENCOUNTER — Inpatient Hospital Stay: Payer: 59 | Attending: Hematology

## 2023-01-14 DIAGNOSIS — I4891 Unspecified atrial fibrillation: Secondary | ICD-10-CM | POA: Diagnosis not present

## 2023-01-14 DIAGNOSIS — Z79899 Other long term (current) drug therapy: Secondary | ICD-10-CM | POA: Diagnosis not present

## 2023-01-14 DIAGNOSIS — D509 Iron deficiency anemia, unspecified: Secondary | ICD-10-CM | POA: Diagnosis present

## 2023-01-14 DIAGNOSIS — D45 Polycythemia vera: Secondary | ICD-10-CM | POA: Insufficient documentation

## 2023-01-14 DIAGNOSIS — Z86718 Personal history of other venous thrombosis and embolism: Secondary | ICD-10-CM | POA: Diagnosis not present

## 2023-01-14 DIAGNOSIS — Z7982 Long term (current) use of aspirin: Secondary | ICD-10-CM | POA: Diagnosis not present

## 2023-01-14 LAB — CBC WITH DIFFERENTIAL (CANCER CENTER ONLY)
Abs Immature Granulocytes: 0.08 10*3/uL — ABNORMAL HIGH (ref 0.00–0.07)
Basophils Absolute: 0.1 10*3/uL (ref 0.0–0.1)
Basophils Relative: 1 %
Eosinophils Absolute: 0.3 10*3/uL (ref 0.0–0.5)
Eosinophils Relative: 3 %
HCT: 30.3 % — ABNORMAL LOW (ref 36.0–46.0)
Hemoglobin: 8.8 g/dL — ABNORMAL LOW (ref 12.0–15.0)
Immature Granulocytes: 1 %
Lymphocytes Relative: 9 %
Lymphs Abs: 0.9 10*3/uL (ref 0.7–4.0)
MCH: 24.2 pg — ABNORMAL LOW (ref 26.0–34.0)
MCHC: 29 g/dL — ABNORMAL LOW (ref 30.0–36.0)
MCV: 83.2 fL (ref 80.0–100.0)
Monocytes Absolute: 0.3 10*3/uL (ref 0.1–1.0)
Monocytes Relative: 3 %
Neutro Abs: 8.7 10*3/uL — ABNORMAL HIGH (ref 1.7–7.7)
Neutrophils Relative %: 83 %
Platelet Count: 346 10*3/uL (ref 150–400)
RBC: 3.64 MIL/uL — ABNORMAL LOW (ref 3.87–5.11)
RDW: 21.1 % — ABNORMAL HIGH (ref 11.5–15.5)
WBC Count: 10.5 10*3/uL (ref 4.0–10.5)
nRBC: 0 % (ref 0.0–0.2)

## 2023-01-14 NOTE — Progress Notes (Signed)
Patient does not meet parameters for phlebotomy today. Met with patient in the lobby. She feels wonderful. Provided a copy of labs and helped patient to main lobby.

## 2023-01-18 ENCOUNTER — Telehealth: Payer: Self-pay | Admitting: *Deleted

## 2023-01-18 DIAGNOSIS — D45 Polycythemia vera: Secondary | ICD-10-CM

## 2023-01-18 NOTE — Telephone Encounter (Signed)
Notified of message below. Message to scheduler 

## 2023-01-18 NOTE — Telephone Encounter (Signed)
-----  Message from Truitt Merle, MD sent at 01/17/2023  1:52 PM EST ----- Please let pt know that she has been anemic lately, probably related to low iron, I recommend her to take oral iron once daily for 6 weeks, then repeat lab, please order CBC, ferritin and iron+TIBC for her next lab, thanks   Truitt Merle

## 2023-01-20 ENCOUNTER — Other Ambulatory Visit: Payer: Self-pay | Admitting: *Deleted

## 2023-01-20 DIAGNOSIS — I482 Chronic atrial fibrillation, unspecified: Secondary | ICD-10-CM

## 2023-01-20 DIAGNOSIS — M1712 Unilateral primary osteoarthritis, left knee: Secondary | ICD-10-CM

## 2023-01-20 DIAGNOSIS — E782 Mixed hyperlipidemia: Secondary | ICD-10-CM

## 2023-01-20 MED ORDER — FENOFIBRATE 145 MG PO TABS: 145.0000 mg | ORAL_TABLET | Freq: Every day | ORAL | 5 refills | Status: DC

## 2023-01-20 MED ORDER — FLECAINIDE ACETATE 50 MG PO TABS: 50.0000 mg | ORAL_TABLET | Freq: Two times a day (BID) | ORAL | 5 refills | Status: DC

## 2023-01-20 MED ORDER — APIXABAN 5 MG PO TABS: 5.0000 mg | ORAL_TABLET | Freq: Two times a day (BID) | ORAL | 5 refills | Status: DC

## 2023-01-20 NOTE — Telephone Encounter (Signed)
Made appt for July 9

## 2023-01-20 NOTE — Telephone Encounter (Signed)
Pt needs 6 mos FU in July w/ new provider. Refills SENT to pharmacy

## 2023-02-09 ENCOUNTER — Ambulatory Visit (INDEPENDENT_AMBULATORY_CARE_PROVIDER_SITE_OTHER): Payer: 59 | Admitting: Family Medicine

## 2023-02-09 ENCOUNTER — Encounter: Payer: Self-pay | Admitting: Family Medicine

## 2023-02-09 VITALS — BP 156/56 | HR 83 | Temp 97.8°F | Ht 64.0 in | Wt 132.6 lb

## 2023-02-09 DIAGNOSIS — L282 Other prurigo: Secondary | ICD-10-CM | POA: Diagnosis not present

## 2023-02-09 MED ORDER — METHYLPREDNISOLONE ACETATE 40 MG/ML IJ SUSP
40.0000 mg | Freq: Once | INTRAMUSCULAR | Status: AC
  Administered 2023-02-09: 60 mg via INTRAMUSCULAR

## 2023-02-09 MED ORDER — TRIAMCINOLONE ACETONIDE 0.1 % EX CREA: 1.0000 | TOPICAL_CREAM | Freq: Two times a day (BID) | CUTANEOUS | 0 refills | Status: DC

## 2023-02-09 NOTE — Progress Notes (Signed)
Subjective:  Patient ID: [name], female    DOB: [birth year], 72 y.o.   MRN: [id number]  Patient Care Team: Ivy Lynn, NP (Inactive) as PCP - General (Nurse Practitioner) Lorretta Harp, MD as PCP - Cardiology (Cardiology) Annia Belt, MD as Consulting Physician (Oncology) [name], MD (Inactive) as Consulting Physician (Vascular Surgery) Truitt Merle, MD as Attending Physician (Hematology and Oncology)   Chief Complaint:  Rash (All over body x 3 weeks- itches )   HPI: [name] is a 72 y.o. female presenting on 02/09/2023 for Rash (All over body x 3 weeks- itches )   Rash This is a new problem. Episode onset: 3 weeks ago. The problem has been waxing and waning since onset. The rash is diffuse. The rash is characterized by itchiness and redness. It is unknown (does report 9 cats and 1 dog in the house) if there was an exposure to a precipitant. Pertinent negatives include no anorexia, congestion, cough, diarrhea, eye pain, facial edema, fatigue, fever, joint pain, nail changes, rhinorrhea, shortness of breath, sore throat or vomiting. Past treatments include anti-itch cream. The treatment provided no relief.     Relevant past medical, surgical, family, and social history reviewed and updated as indicated.  Allergies and medications reviewed and updated. Data reviewed: Chart in Epic.   Past Medical History:  Diagnosis Date   Arthritis    "fingers, some in my knees" (01/28/2016)   CAD (coronary artery disease), per cath 04/27/13, non obstructive disease, EF 65-70% 05/11/2013   Carotid disease, bilateral ([place]) 09/20/2013   Lt carotid 50-69% stentosis, less on rt   Dysrhythmia    Fatty liver    GERD (gastroesophageal reflux disease)    Glaucoma    H/O cardiovascular stress test 12/27/2009   negative for ischemia   Headache    "occasional since eye pressure regulated" (01/28/2016)   Hyperlipidemia    Hypertension    Leukocytosis 07/15/2015    Migraine    "stopped w/laser holes to relieve pressure in my eyes; had them 3-4 times/wk before taht" (01/28/2016)   Mitral regurgitation,2+ by cath 05/11/2013   NSTEMI (non-ST elevated myocardial infarction) ([place]) 04/27/2013   Pain    BOTH KNEES - PT HAS TORN MENISCUS LEFT KNEE   Paroxysmal atrial fibrillation (HCC)    hx of a fib on ASA  AND ELIQUIS    Polycythemia vera ([place]) 08/20/2015   JAK-2 positive 07/19/15   PVD (peripheral vascular disease) with claudication ([place]) 07/21/2006   previous L ext iliac stent and rt SFA stent, known occluded Lt SFA   S/P cardiac cath 04/27/2013   NON OBSTRUCTIVE DISEASE, 2+ mr, ef 65-70%   Statin intolerance    Stroke ([place]) 12/21/2004   SWELLING ALL OVER AND RT SIDE OF FACE DRAWN AND SPEECH SLURRED AND NUMBNESS ON RIGHT SIDE-- ALL RESOLVED   TIA (transient ischemic attack)     Past Surgical History:  Procedure Laterality Date   AMPUTATION Right 05/21/2017   Procedure: AMPUTATION ABOVE KNEE;  Surgeon: Rosetta Posner, MD;  Location: [facility];  Service: Vascular;  Laterality: Right;   CARDIAC CATHETERIZATION  04/27/13   non occlusive disease with mild to mod. calcified lesions in ostial LM and proximal LAD and moderate prox. RC AND DISTAL AV GROOVE LCX, ef   CATARACT EXTRACTION W/PHACO Right 03/21/2015   Procedure: CATARACT EXTRACTION PHACO AND INTRAOCULAR LENS PLACEMENT RIGHT EYE;  Surgeon: Baruch Goldmann, MD;  Location: [facility];  Service:  Ophthalmology;  Laterality: Right;  CDE:5.10   CATARACT EXTRACTION W/PHACO Left 05/16/2015   Procedure: CATARACT EXTRACTION PHACO AND INTRAOCULAR LENS PLACEMENT (IOC);  Surgeon: Baruch Goldmann, MD;  Location: [facility];  Service: Ophthalmology;  Laterality: Left;  CDE 5.57   CESAREAN SECTION  1977   COLONOSCOPY N/A 09/11/2016   Procedure: COLONOSCOPY;  Surgeon: Rogene Houston, MD;  [facility];  Service: Endoscopy;  Laterality: N/A;  730-moved to 1:00 Ann notified pt   EMBOLECTOMY Right 02/01/2016   Procedure:  Thrombectomy of Right Femoral-Popliteal Bypass Graft; Endarterectomy of Right Below Knee Popliteal Artery and Tibial Peroneal Trunk with Bovine Pericardium Patch Angioplasty.;  Surgeon: Angelia Mould, MD;  Location: [facility];  Service: Vascular;  Laterality: Right;   FEMORAL-POPLITEAL BYPASS GRAFT Right 01/31/2016   Procedure: BYPASS GRAFT RIGHT COMMON  FEMORALTO BELOW KNEE POPLITEAL ARTERY BYPASS GRAFT USING 6MM PROPATEN GORTEX GRAFT;  Surgeon: Mal Misty, MD;  Location: [facility];  Service: Vascular;  Laterality: Right;   FEMORAL-POPLITEAL BYPASS GRAFT Right 01/09/2017   Procedure: THROMBECTOMY OF RIGHT FEMORAL-POPLITEAL  ARTERY BYPASS GRAFT; THROMBECTOMY RIGHT  TIBIAL VESSELS;  Surgeon: [name], MD;  Location: [facility];  Service: Vascular;  Laterality: Right;   FEMORAL-POPLITEAL BYPASS GRAFT Right 05/17/2017   Procedure: RIGHT FEMORAL-TIBIAL PERONEAL TRUNK ARTERY BYPASS GRAFT USING 62mX80cm PROPATEN GRAFT WITH REMOVABLE RING;  Surgeon: ERosetta Posner MD;  [facility]  Service: Vascular;  Laterality: Right;   FRACTURE SURGERY     ILIAC ARTERY STENT  07/2006   external iliac and Rt SFA stent 07/2006 AND THE LEFT WAS IN 2006   KNEE ARTHROSCOPY WITH MEDIAL MENISECTOMY Left 03/27/2014   Procedure: left knee arthorscopy with medial chondraplasty of the medial femoral and patella, medial microfracture technique of medial femoral condyl;  Surgeon: RTobi Bastos MD;  [facility];  Service: Orthopedics;  Laterality: Left;   LEFT HEART CATHETERIZATION WITH CORONARY ANGIOGRAM Right 04/27/2013   Procedure: LEFT HEART CATHETERIZATION WITH CORONARY ANGIOGRAM;  Surgeon: DLeonie Man MD;  [facility];  Service: Cardiovascular;  Laterality: Right;   LOWER EXTREMITY ANGIOGRAM Right 01/31/2016   Procedure: INTRAOP RIGHT LOWER EXTREMITY ANGIOGRAM;  Surgeon: JMal Misty MD;  [place] [facility]  Service: Vascular;  Laterality: Right;   ORIF WRIST FRACTURE Right 1983   OVARIAN CYST REMOVAL  Right    PATCH ANGIOPLASTY Right 01/31/2016   Procedure: RIGHT COMMON FEMORAL AND PROFUNDA FEMORIS ENDARECTOMY WITH PATCH ANGIOPLASTY;  Surgeon: JMal Misty MD;  [place] [facility]  Service: Vascular;  Laterality: Right;   PATCH ANGIOPLASTY Right 01/09/2017   Procedure: PATCH ANGIOPLASTY RIGHT POPLITEAL ARTERY BYPASS GRAFT;  Surgeon: CElam Dutch MD;  [place] [facility]  Service: Vascular;  Laterality: Right;   PERIPHERAL VASCULAR CATHETERIZATION N/A 06/27/2015   Procedure: Lower Extremity Angiography;  Surgeon: JLorretta Harp MD;  [facility];  Service: Cardiovascular;  Laterality: N/A;   PERIPHERAL VASCULAR CATHETERIZATION Bilateral 01/29/2016   Procedure: Lower Extremity Angiography;  Surgeon: MWellington Hampshire MD;  [facility];  Service: Cardiovascular;  Laterality: Bilateral;   PERIPHERAL VASCULAR CATHETERIZATION N/A 01/29/2016   Procedure: Abdominal Aortogram;  Surgeon: MWellington Hampshire MD;  [facility];  Service: Cardiovascular;  Laterality: N/A;   REFRACTIVE SURGERY Bilateral    "6 in one eye; 7 in the other; to relieve pressure; not glaucoma" (01/28/2016)   SFA Right 06/27/2015   overlapping Bahn covered stents  TUBAL LIGATION  1983   VEIN HARVEST Left 05/17/2017   Procedure: LEFT LEG GREATER SAPHENOUS VEIN HARVEST;  Surgeon: Rosetta Posner, MD;  Location: [facility];  Service: Vascular;  Laterality: Left;   VEIN REPAIR Right 01/31/2016   Procedure: RIGHT GREATER SAPHENOUS VEIN EXAMNED BUT NOT REMOVED;  Surgeon: Mal Misty, MD;  Location: [facility];  Service: Vascular;  Laterality: Right;    Social History   Socioeconomic History   Marital status: Widowed    Spouse name: Not on file   Number of children: 2   Years of education: 52   Highest education level: Not on file  Occupational History   Occupation: CNA  Tobacco Use   Smoking status: Former    Packs/day: 1.00    Years: 45.00    Total pack years: 45.00    Types: Cigarettes     Quit date: 11/11/2012    Years since quitting: 10.2   Smokeless tobacco: Never   Tobacco comments:    quit 4 years ago.    10/27/21-Pt declines lung ca screen at this time.   Vaping Use   Vaping Use: Never used  Substance and Sexual Activity   Alcohol use: No    Alcohol/week: 0.0 standard drinks of alcohol   Drug use: No   Sexual activity: Not Currently    Birth control/protection: None  Other Topics Concern   Not on file  Social History Narrative   Husband passed away after CVA in March 16, 2005.   2 children, 5 grandchildren and 1 great grandchild.   Social Determinants of Health   Financial Resource Strain: Low Risk  (11/27/2022)   Overall Financial Resource Strain (CARDIA)    Difficulty of Paying Living Expenses: Not hard at all  Food Insecurity: No Food Insecurity (11/28/2022)   Hunger Vital Sign    Worried About Running Out of Food in the Last Year: Never true    Ran Out of Food in the Last Year: Never true  Transportation Needs: No Transportation Needs (11/28/2022)   PRAPARE - Hydrologist (Medical): No    Lack of Transportation (Non-Medical): No  Physical Activity: Insufficiently Active (11/27/2022)   Exercise Vital Sign    Days of Exercise per Week: 3 days    Minutes of Exercise per Session: 30 min  Stress: No Stress Concern Present (11/27/2022)   [place]    Feeling of Stress : Not at all  Social Connections: Socially Isolated (11/27/2022)   Social Connection and Isolation Panel [NHANES]    Frequency of Communication with Friends and Family: More than three times a week    Frequency of Social Gatherings with Friends and Family: More than three times a week    Attends Religious Services: Never    Marine scientist or Organizations: No    Attends Archivist Meetings: Never    Marital Status: Widowed  Intimate Partner Violence: Not At Risk (11/28/2022)    Humiliation, Afraid, Rape, and Kick questionnaire    Fear of Current or Ex-Partner: No    Emotionally Abused: No    Physically Abused: No    Sexually Abused: No    Outpatient Encounter Medications as of 02/09/2023  Medication Sig   acetaminophen (TYLENOL) 500 MG tablet Take 2 tablets (1,000 mg total) by mouth every 6 (six) hours.   apixaban (ELIQUIS) 5 MG TABS tablet Take 1 tablet (5 mg total) by mouth 2 (two) times  daily.   ARTIFICIAL TEAR OP Place 1 drop into both eyes as needed (for dry eyes).   aspirin EC 325 MG tablet Take 325 mg by mouth daily.   diclofenac Sodium (VOLTAREN) 1 % GEL Apply 2 g topically 4 (four) times daily. (Patient taking differently: Apply 2 g topically 4 (four) times daily as needed (pain).)   diltiazem (CARDIZEM CD) 240 MG 24 hr capsule TAKE 1 CAPSULE EVERY MORNING   escitalopram (LEXAPRO) 20 MG tablet TAKE ONE TABLET ONCE DAILY (Patient taking differently: Take 20 mg by mouth daily.)   ezetimibe (ZETIA) 10 MG tablet TAKE ONE TABLET ONCE DAILY   fenofibrate (TRICOR) 145 MG tablet Take 1 tablet (145 mg total) by mouth daily.   flecainide (TAMBOCOR) 50 MG tablet Take 1 tablet (50 mg total) by mouth 2 (two) times daily.   hydroxyurea (HYDREA) 500 MG capsule TAKE 1 CAPSULE WITH FOOD EVERY DAY EXCEPT SUNDAYS (Patient taking differently: Take 500 mg by mouth See admin instructions. Take 1 capsule with food daily except on Sundays.)   Multiple Vitamins-Minerals (CENTRUM SILVER ADULT 50+ PO) Take by mouth.   omeprazole (PRILOSEC) 20 MG capsule TAKE ONE CAPSULE TWICE DAILY BEFORE MEALS   traMADol (ULTRAM) 50 MG tablet Take 1 tablet (50 mg total) by mouth 2 (two) times daily.   triamcinolone cream (KENALOG) 0.1 % Apply 1 Application topically 2 (two) times daily.   valsartan (DIOVAN) 160 MG tablet TAKE ONE TABLET EVERY EVENING (WITH 320MG IN THE MORNING)   valsartan (DIOVAN) 320 MG tablet TAKE ONE TABLET EVERY MORNING (WITH 160MG EVERY EVENING)   [EXPIRED] methylPREDNISolone  acetate (DEPO-MEDROL) injection 40 mg    No facility-administered encounter medications on file as of 02/09/2023.    Allergies  Allergen Reactions   Atenolol Rash and Itching   Demerol  [Meperidine Hcl] Itching   Demerol [Meperidine] Other (See Comments) and Itching    Unknown, can't remember  Unknown, can't remember    Gabapentin Anxiety and Other (See Comments)    SI and HI   Inderal [Propranolol] Other (See Comments) and Itching    Doesn't remember    Prednisone Other (See Comments)    Muscle spasms   Statins Itching and Other (See Comments)    Simvastatin-caused severe itching Pravastatin-caused lesser tiching One type of statin? Caused stroke in 2006, and other symptoms as result   Warfarin And Related Other (See Comments)    Caused nose bleeds   Crestor [Rosuvastatin] Itching and Rash   Docosahexaenoic Acid (Dha)     Other reaction(s): Other (See Comments) nosebleeds   Lactose Intolerance (Gi) Other (See Comments)    Bloating, gas   Lipitor [Atorvastatin] Rash   Lovaza [Omega-3-Acid Ethyl Esters] Other (See Comments)    nosebleeds    Penicillins Hives, Rash and Other (See Comments)    Has patient had a PCN reaction causing immediate rash, facial/tongue/throat swelling, SOB or lightheadedness with hypotension: Yes Has patient had a PCN reaction causing severe rash involving mucus membranes or skin necrosis: No Has patient had a PCN reaction that required hospitalization: No Has patient had a PCN reaction occurring within the last 10 years: No If all of the above answers are "NO", then may proceed with Cephalosporin use.  Questionable high fever    Pravastatin Itching and Rash   Simvastatin Itching, Rash and Other (See Comments)   Trilipix [Choline Fenofibrate] Other (See Comments)    Doesn't remember    Warfarin Other (See Comments)    nosebleeds  Hydralazine Swelling   Effexor [Venlafaxine Hcl] Other (See Comments)    Doesn't remember    Nexium [Esomeprazole  Magnesium] Rash   Percocet [Oxycodone-Acetaminophen] Other (See Comments)    Doesn't remember    Plavix [Clopidogrel Bisulfate] Rash    Review of Systems  Constitutional:  Negative for activity change, appetite change, chills, diaphoresis, fatigue, fever and unexpected weight change.  HENT:  Negative for congestion, rhinorrhea and sore throat.   Eyes:  Negative for photophobia, pain and visual disturbance.  Respiratory:  Negative for apnea, cough, choking, chest tightness, shortness of breath, wheezing and stridor.   Cardiovascular:  Negative for chest pain, palpitations and leg swelling.  Gastrointestinal:  Negative for abdominal pain, anorexia, diarrhea and vomiting.  Genitourinary:  Negative for decreased urine volume and difficulty urinating.  Musculoskeletal:  Negative for joint pain.  Skin:  Positive for color change and rash. Negative for nail changes, pallor and wound.  Neurological:  Negative for weakness.  Psychiatric/Behavioral:  Negative for confusion.   All other systems reviewed and are negative.       Objective:  BP (!) 156/56   Pulse 83   Temp 97.8 F (36.6 C) (Temporal)   Ht 5' 4"$  (1.626 m)   Wt 132 lb 9.6 oz (60.1 kg)   SpO2 94%   BMI 22.76 kg/m    Wt Readings from Last 3 Encounters:  02/09/23 132 lb 9.6 oz (60.1 kg)  12/25/22 134 lb (60.8 kg)  12/09/22 134 lb 6.4 oz (61 kg)    Physical Exam Vitals and nursing note reviewed.  Constitutional:      Appearance: She is ill-appearing (chronically).  HENT:     Head: Normocephalic and atraumatic.     Mouth/Throat:     Mouth: Mucous membranes are moist.  Eyes:     Conjunctiva/sclera: Conjunctivae normal.     Pupils: Pupils are equal, round, and reactive to light.  Cardiovascular:     Rate and Rhythm: Normal rate and regular rhythm.  Pulmonary:     Effort: Pulmonary effort is normal.     Breath sounds: Normal breath sounds.  Musculoskeletal:     Comments: Right AKA prosthesis  Skin:    General: Skin  is warm and dry.     Capillary Refill: Capillary refill takes less than 2 seconds.     Findings: Rash (scattered macular and papular lesions to arms, torso, and upper legs, no surrounding erythema) present.  Neurological:     General: No focal deficit present.     Mental Status: She is alert and oriented to person, place, and time.     Gait: Gait abnormal (using walker).  Psychiatric:        Mood and Affect: Mood normal.        Behavior: Behavior normal.        Thought Content: Thought content normal.        Judgment: Judgment normal.     Results for orders placed or performed in visit on 01/14/23  CBC with Differential (Cancer Center Only)  Result Value Ref Range   WBC Count 10.5 4.0 - 10.5 K/uL   RBC 3.64 (L) 3.87 - 5.11 MIL/uL   Hemoglobin 8.8 (L) 12.0 - 15.0 g/dL   HCT 30.3 (L) 36.0 - 46.0 %   MCV 83.2 80.0 - 100.0 fL   MCH 24.2 (L) 26.0 - 34.0 pg   MCHC 29.0 (L) 30.0 - 36.0 g/dL   RDW 21.1 (H) 11.5 - 15.5 %   Platelet Count  346 150 - 400 K/uL   nRBC 0.0 0.0 - 0.2 %   Neutrophils Relative % 83 %   Neutro Abs 8.7 (H) 1.7 - 7.7 K/uL   Lymphocytes Relative 9 %   Lymphs Abs 0.9 0.7 - 4.0 K/uL   Monocytes Relative 3 %   Monocytes Absolute 0.3 0.1 - 1.0 K/uL   Eosinophils Relative 3 %   Eosinophils Absolute 0.3 0.0 - 0.5 K/uL   Basophils Relative 1 %   Basophils Absolute 0.1 0.0 - 0.1 K/uL   Immature Granulocytes 1 %   Abs Immature Granulocytes 0.08 (H) 0.00 - 0.07 K/uL       Pertinent labs & imaging results that were available during my care of the patient were reviewed by me and considered in my medical decision making.  Assessment & Plan:  Sheril was seen today for rash.  Diagnoses and all orders for this visit:  Pruritic rash Rash appears to be flea bites as pt does have 9 cats and 1 dog in her home. No indications of cellulitis. Will treat with IM DepoMedrol and topical kenalog cream. Symptomatic care discussed in detail. Discussed treating animals and house for  fleas. -     triamcinolone cream (KENALOG) 0.1 %; Apply 1 Application topically 2 (two) times daily. -     methylPREDNISolone acetate (DEPO-MEDROL) injection 40 mg     Continue all other maintenance medications.  Follow up plan: Return if symptoms worsen or fail to improve, for establish care appointment with [facility].   Continue healthy lifestyle choices, including diet (rich in fruits, vegetables, and lean proteins, and low in salt and simple carbohydrates) and exercise (at least 30 minutes of moderate physical activity daily).  Educational handout given for rash  The above assessment and management plan was discussed with the patient. The patient verbalized understanding of and has agreed to the management plan. Patient is aware to call the clinic if they develop any new symptoms or if symptoms persist or worsen. Patient is aware when to return to the clinic for a follow-up visit. Patient educated on when it is appropriate to go to the emergency department.   Monia Pouch, FNP-C [facility] [phone]

## 2023-02-16 ENCOUNTER — Encounter: Payer: Self-pay | Admitting: Family Medicine

## 2023-02-16 ENCOUNTER — Ambulatory Visit (INDEPENDENT_AMBULATORY_CARE_PROVIDER_SITE_OTHER): Payer: 59 | Admitting: Family Medicine

## 2023-02-16 VITALS — BP 159/72 | HR 97 | Temp 98.6°F | Ht 64.0 in | Wt 132.0 lb

## 2023-02-16 DIAGNOSIS — S80812A Abrasion, left lower leg, initial encounter: Secondary | ICD-10-CM

## 2023-02-16 DIAGNOSIS — Z87898 Personal history of other specified conditions: Secondary | ICD-10-CM | POA: Diagnosis not present

## 2023-02-16 DIAGNOSIS — I739 Peripheral vascular disease, unspecified: Secondary | ICD-10-CM

## 2023-02-16 DIAGNOSIS — G894 Chronic pain syndrome: Secondary | ICD-10-CM | POA: Diagnosis not present

## 2023-02-16 DIAGNOSIS — R234 Changes in skin texture: Secondary | ICD-10-CM

## 2023-02-16 DIAGNOSIS — Z79891 Long term (current) use of opiate analgesic: Secondary | ICD-10-CM

## 2023-02-16 DIAGNOSIS — G8929 Other chronic pain: Secondary | ICD-10-CM

## 2023-02-16 DIAGNOSIS — D45 Polycythemia vera: Secondary | ICD-10-CM

## 2023-02-16 DIAGNOSIS — F339 Major depressive disorder, recurrent, unspecified: Secondary | ICD-10-CM

## 2023-02-16 DIAGNOSIS — M25562 Pain in left knee: Secondary | ICD-10-CM

## 2023-02-16 DIAGNOSIS — S78111A Complete traumatic amputation at level between right hip and knee, initial encounter: Secondary | ICD-10-CM

## 2023-02-16 MED ORDER — TRAMADOL HCL 50 MG PO TABS: 50.0000 mg | ORAL_TABLET | Freq: Two times a day (BID) | ORAL | 0 refills | Status: AC

## 2023-02-16 NOTE — Progress Notes (Signed)
New Patient Office Visit  Subjective   Patient ID: [name], female    DOB: 07/29/1951  Age: 72 y.o. MRN: [id number]  CC:  Chief Complaint  Patient presents with   Establish Care    Tramadol refill, needs updated contract, [facility] lab    HPI [name] presents to establish care Follows closely with Dr. [name] Cardiology. States she has appt in a couple of weeks with him.  Oncology for PV  Would like refill on tramadol   States that she does not take it all the time. Takes it for pain in her left knee. States on average takes it 3 times a weeks, sometimes 2-3 times that day. Former substance use (cocaine) states that she wants to be careful with it. Is going to [place] to her sisters house for the next 4-6 weeks.    Outpatient Encounter Medications as of 02/16/2023  Medication Sig   acetaminophen (TYLENOL) 500 MG tablet Take 2 tablets (1,000 mg total) by mouth every 6 (six) hours.   apixaban (ELIQUIS) 5 MG TABS tablet Take 1 tablet (5 mg total) by mouth 2 (two) times daily.   ARTIFICIAL TEAR OP Place 1 drop into both eyes as needed (for dry eyes).   aspirin EC 325 MG tablet Take 325 mg by mouth daily.   diclofenac Sodium (VOLTAREN) 1 % GEL Apply 2 g topically 4 (four) times daily. (Patient taking differently: Apply 2 g topically 4 (four) times daily as needed (pain).)   diltiazem (CARDIZEM CD) 240 MG 24 hr capsule TAKE 1 CAPSULE EVERY MORNING   escitalopram (LEXAPRO) 20 MG tablet TAKE ONE TABLET ONCE DAILY (Patient taking differently: Take 20 mg by mouth daily.)   ezetimibe (ZETIA) 10 MG tablet TAKE ONE TABLET ONCE DAILY   fenofibrate (TRICOR) 145 MG tablet Take 1 tablet (145 mg total) by mouth daily.   flecainide (TAMBOCOR) 50 MG tablet Take 1 tablet (50 mg total) by mouth 2 (two) times daily.   hydroxyurea (HYDREA) 500 MG capsule TAKE 1 CAPSULE WITH FOOD EVERY DAY EXCEPT SUNDAYS (Patient taking differently: Take 500 mg by mouth See admin instructions. Take 1 capsule  with food daily except on Sundays.)   Multiple Vitamins-Minerals (CENTRUM SILVER ADULT 50+ PO) Take by mouth.   omeprazole (PRILOSEC) 20 MG capsule TAKE ONE CAPSULE TWICE DAILY BEFORE MEALS   traMADol (ULTRAM) 50 MG tablet Take 1 tablet (50 mg total) by mouth 2 (two) times daily.   triamcinolone cream (KENALOG) 0.1 % Apply 1 Application topically 2 (two) times daily.   valsartan (DIOVAN) 160 MG tablet TAKE ONE TABLET EVERY EVENING (WITH '320MG'$  IN THE MORNING)   valsartan (DIOVAN) 320 MG tablet TAKE ONE TABLET EVERY MORNING (WITH '160MG'$  EVERY EVENING)   No facility-administered encounter medications on file as of 02/16/2023.    Past Medical History:  Diagnosis Date   Arthritis    "fingers, some in my knees" (01/28/2016)   CAD (coronary artery disease), per cath 04/27/13, non obstructive disease, EF 65-70% 05/11/2013   Carotid disease, bilateral ([place]) 09/20/2013   Lt carotid 50-69% stentosis, less on rt   Dysrhythmia    Fatty liver    GERD (gastroesophageal reflux disease)    Glaucoma    H/O cardiovascular stress test 12/27/2009   negative for ischemia   Headache    "occasional since eye pressure regulated" (01/28/2016)   Hyperlipidemia    Hypertension    Leukocytosis 07/15/2015   Migraine    "stopped w/laser holes to relieve  pressure in my eyes; had them 3-4 times/wk before taht" (01/28/2016)   Mitral regurgitation,2+ by cath 05/11/2013   NSTEMI (non-ST elevated myocardial infarction) ([place]) 04/27/2013   Pain    BOTH KNEES - PT HAS TORN MENISCUS LEFT KNEE   Paroxysmal atrial fibrillation (HCC)    hx of a fib on ASA  AND ELIQUIS    Polycythemia vera ([place]) 08/20/2015   JAK-2 positive 07/19/15   PVD (peripheral vascular disease) with claudication ([place]) 07/21/2006   previous L ext iliac stent and rt SFA stent, known occluded Lt SFA   S/P cardiac cath 04/27/2013   NON OBSTRUCTIVE DISEASE, 2+ mr, ef 65-70%   Statin intolerance    Stroke ([place]) 12/21/2004   SWELLING ALL OVER AND RT SIDE OF  FACE DRAWN AND SPEECH SLURRED AND NUMBNESS ON RIGHT SIDE-- ALL RESOLVED   TIA (transient ischemic attack)     Past Surgical History:  Procedure Laterality Date   AMPUTATION Right 05/21/2017   Procedure: AMPUTATION ABOVE KNEE;  Surgeon: Rosetta Posner, MD;  Location: [facility];  Service: Vascular;  Laterality: Right;   CARDIAC CATHETERIZATION  04/27/13   non occlusive disease with mild to mod. calcified lesions in ostial LM and proximal LAD and moderate prox. RC AND DISTAL AV GROOVE LCX, ef   CATARACT EXTRACTION W/PHACO Right 03/21/2015   Procedure: CATARACT EXTRACTION PHACO AND INTRAOCULAR LENS PLACEMENT RIGHT EYE;  Surgeon: Baruch Goldmann, MD;  [facility];  Service: Ophthalmology;  Laterality: Right;  CDE:5.10   CATARACT EXTRACTION W/PHACO Left 05/16/2015   Procedure: CATARACT EXTRACTION PHACO AND INTRAOCULAR LENS PLACEMENT (IOC);  Surgeon: Baruch Goldmann, MD;  Location: [facility];  Service: Ophthalmology;  Laterality: Left;  CDE 5.57   CESAREAN SECTION  1977   COLONOSCOPY N/A 09/11/2016   Procedure: COLONOSCOPY;  Surgeon: Rogene Houston, MD;  [facility];  Service: Endoscopy;  Laterality: N/A;  730-moved to 1:00 Ann notified pt   EMBOLECTOMY Right 02/01/2016   Procedure: Thrombectomy of Right Femoral-Popliteal Bypass Graft; Endarterectomy of Right Below Knee Popliteal Artery and Tibial Peroneal Trunk with Bovine Pericardium Patch Angioplasty.;  Surgeon: Angelia Mould, MD;  Location: [facility];  Service: Vascular;  Laterality: Right;   FEMORAL-POPLITEAL BYPASS GRAFT Right 01/31/2016   Procedure: BYPASS GRAFT RIGHT COMMON  FEMORALTO BELOW KNEE POPLITEAL ARTERY BYPASS GRAFT USING 6MM PROPATEN GORTEX GRAFT;  Surgeon: Mal Misty, MD;  Location: [facility];  Service: Vascular;  Laterality: Right;   FEMORAL-POPLITEAL BYPASS GRAFT Right 01/09/2017   Procedure: THROMBECTOMY OF RIGHT FEMORAL-POPLITEAL  ARTERY BYPASS GRAFT; THROMBECTOMY RIGHT  TIBIAL VESSELS;  Surgeon: [name], MD;  Location:  [facility];  Service: Vascular;  Laterality: Right;   FEMORAL-POPLITEAL BYPASS GRAFT Right 05/17/2017   Procedure: RIGHT FEMORAL-TIBIAL PERONEAL TRUNK ARTERY BYPASS GRAFT USING 5mX80cm PROPATEN GRAFT WITH REMOVABLE RING;  Surgeon: ERosetta Posner MD;  [facility]  Service: Vascular;  Laterality: Right;   FRACTURE SURGERY     ILIAC ARTERY STENT  07/2006   external iliac and Rt SFA stent 07/2006 AND THE LEFT WAS IN 2006   KNEE ARTHROSCOPY WITH MEDIAL MENISECTOMY Left 03/27/2014   Procedure: left knee arthorscopy with medial chondraplasty of the medial femoral and patella, medial microfracture technique of medial femoral condyl;  Surgeon: RTobi Bastos MD;  Location: [facility];  Service: Orthopedics;  Laterality: Left;   LEFT HEART CATHETERIZATION WITH CORONARY ANGIOGRAM Right 04/27/2013   Procedure: LEFT HEART CATHETERIZATION WITH CORONARY ANGIOGRAM;  Surgeon: DLeonie Man  MD;  Location: [facility];  Service: Cardiovascular;  Laterality: Right;   LOWER EXTREMITY ANGIOGRAM Right 01/31/2016   Procedure: INTRAOP RIGHT LOWER EXTREMITY ANGIOGRAM;  Surgeon: Mal Misty, MD;  Location: [facility];  Service: Vascular;  Laterality: Right;   ORIF WRIST FRACTURE Right 1983   OVARIAN CYST REMOVAL Right    PATCH ANGIOPLASTY Right 01/31/2016   Procedure: RIGHT COMMON FEMORAL AND PROFUNDA FEMORIS ENDARECTOMY WITH PATCH ANGIOPLASTY;  Surgeon: Mal Misty, MD;  Location: [facility];  Service: Vascular;  Laterality: Right;   PATCH ANGIOPLASTY Right 01/09/2017   Procedure: PATCH ANGIOPLASTY RIGHT POPLITEAL ARTERY BYPASS GRAFT;  Surgeon: [name], MD;  Location: [facility];  Service: Vascular;  Laterality: Right;   PERIPHERAL VASCULAR CATHETERIZATION N/A 06/27/2015   Procedure: Lower Extremity Angiography;  Surgeon: Lorretta Harp, MD;  Location: [facility];  Service: Cardiovascular;  Laterality: N/A;   PERIPHERAL VASCULAR CATHETERIZATION Bilateral 01/29/2016   Procedure: Lower Extremity Angiography;  Surgeon:  Wellington Hampshire, MD;  Location: [facility];  Service: Cardiovascular;  Laterality: Bilateral;   PERIPHERAL VASCULAR CATHETERIZATION N/A 01/29/2016   Procedure: Abdominal Aortogram;  Surgeon: Wellington Hampshire, MD;  Location: [facility];  Service: Cardiovascular;  Laterality: N/A;   REFRACTIVE SURGERY Bilateral    "6 in one eye; 7 in the other; to relieve pressure; not glaucoma" (01/28/2016)   SFA Right 06/27/2015   overlapping Bahn covered stents   [place] Left 05/17/2017   Procedure: LEFT LEG GREATER SAPHENOUS VEIN HARVEST;  Surgeon: Rosetta Posner, MD;  [facility];  Service: Vascular;  Laterality: Left;   VEIN REPAIR Right 01/31/2016   Procedure: RIGHT GREATER SAPHENOUS VEIN EXAMNED BUT NOT REMOVED;  Surgeon: Mal Misty, MD;  Location: [facility];  Service: Vascular;  Laterality: Right;    Family History  Problem Relation Age of Onset   CAD Mother    Lung cancer Mother    Heart attack Mother    CAD Father    Heart disease Father    Heart attack Father 85       died in hes 37's with heart disease   Healthy Sister    Liver cancer Brother    Healthy Sister    CAD Brother        has had CABG   CAD Brother     Social History   Socioeconomic History   Marital status: Widowed    Spouse name: Not on file   Number of children: 2   Years of education: 60   Highest education level: Not on file  Occupational History   Occupation: CNA  Tobacco Use   Smoking status: Former    Packs/day: 1.00    Years: 45.00    Total pack years: 45.00    Types: Cigarettes    Quit date: 11/11/2012    Years since quitting: 10.2   Smokeless tobacco: Never   Tobacco comments:    quit 4 years ago.    10/27/21-Pt declines lung ca screen at this time.   Vaping Use   Vaping Use: Never used  Substance and Sexual Activity   Alcohol use: No    Alcohol/week: 0.0 standard drinks of alcohol   Drug use: No   Sexual activity: Not Currently    Birth  control/protection: None  Other Topics Concern   Not on file  Social History Narrative   Husband passed away after CVA in  2006.   2 children, 5 grandchildren and 1 great grandchild.   Social Determinants of Health   Financial Resource Strain: Low Risk  (11/27/2022)   Overall Financial Resource Strain (CARDIA)    Difficulty of Paying Living Expenses: Not hard at all  Food Insecurity: No Food Insecurity (11/28/2022)   Hunger Vital Sign    Worried About Running Out of Food in the Last Year: Never true    Ran Out of Food in the Last Year: Never true  Transportation Needs: No Transportation Needs (11/28/2022)   PRAPARE - Hydrologist (Medical): No    Lack of Transportation (Non-Medical): No  Physical Activity: Insufficiently Active (11/27/2022)   Exercise Vital Sign    Days of Exercise per Week: 3 days    Minutes of Exercise per Session: 30 min  Stress: No Stress Concern Present (11/27/2022)   [place]    Feeling of Stress : Not at all  Social Connections: Socially Isolated (11/27/2022)   Social Connection and Isolation Panel [NHANES]    Frequency of Communication with Friends and Family: More than three times a week    Frequency of Social Gatherings with Friends and Family: More than three times a week    Attends Religious Services: Never    Marine scientist or Organizations: No    Attends Archivist Meetings: Never    Marital Status: Widowed  Intimate Partner Violence: Not At Risk (11/28/2022)   Humiliation, Afraid, Rape, and Kick questionnaire    Fear of Current or Ex-Partner: No    Emotionally Abused: No    Physically Abused: No    Sexually Abused: No    ROS As per HPI    Objective   BP (!) 159/72   Pulse 97   Temp 98.6 F (37 C)   Ht '5\' 4"'$  (1.626 m)   Wt 132 lb (59.9 kg)   SpO2 95%   BMI 22.66 kg/m   Physical Exam Constitutional:      General: She is not  in acute distress.    Appearance: Normal appearance. She is not ill-appearing, toxic-appearing or diaphoretic.  Cardiovascular:     Rate and Rhythm: Normal rate.     Pulses: Normal pulses.     Heart sounds: Normal heart sounds. No murmur heard.    No gallop.  Pulmonary:     Effort: Pulmonary effort is normal. No respiratory distress.     Breath sounds: Normal breath sounds. No stridor. No wheezing, rhonchi or rales.  Musculoskeletal:     Comments: Uses prosthetic of right leg. Walks with walker.      Right Lower Extremity: Right leg is amputated above knee.  Skin:    General: Skin is warm.     Capillary Refill: Capillary refill takes less than 2 seconds.     Coloration: Skin is pale.     Findings: Abrasion present.       Neurological:     General: No focal deficit present.     Mental Status: She is alert and oriented to person, place, and time. Mental status is at baseline.     Motor: No weakness.  Psychiatric:        Mood and Affect: Mood normal.        Behavior: Behavior normal.        Thought Content: Thought content normal.        Judgment: Judgment normal.    Assessment &  Plan:  1. Chronic pain disorder 2. Chronic pain of left knee Labs as below. Will communicate results to patient once available.  Referral as below. Request referral given long term use of opiate analgesic and history of substance use. Medication refill as below. Pt aware that this is a one-time refill until she can get an appointment scheduled with pain management clinic.  Per previous notes, pt was receiving Tramadol for DDD of lumbar spine. Did not see imaging in chart for lumbar spine. Pt today explains that she takes is PRN for pain in her left knee. Imaging has been completed on her left knee.   CT from 09/11/2018 shows "The bones are demineralized. There is mildly depressed intra-articular fracture involving the lateral tibial plateau. There is up to 4 mm of depression of the articular surface  anteriorly on sagittal image 17/7. The lateral metaphyseal cortex is minimally displaced. There is no undermining of the tibial spines. The medial tibial plateau is intact."  - ToxASSURE Select 13 (MW), Urine - Ambulatory referral to Pain Clinic - traMADol (ULTRAM) 50 MG tablet; Take 1 tablet (50 mg total) by mouth 2 (two) times daily.  Dispense: 60 tablet; Refill: 0  3. History of substance use Referral as above. Pt states that she used cocaine for ~1 year prior to 2000. She has not used any substances including alcohol since 2000.   4. Long term (current) use of opiate analgesic Labs as below. Will communicate results to patient once available.  Prior labs not within normal limits will monitor as below.  - ToxASSURE Select 13 (MW), Urine - CMP14+EGFR  5. Polycythemia vera ([place]) Follows closely with oncology. Reviewed EHR. Continue current regimen.   6. Unilateral AKA, right (HCC) Pt has prosthesis and walks with a walker. Is able to complete ADLs. Will continue current plan.   7. Depression, recurrent ([place])  Screening negative today in clinic. Will continue current regimen.   8. Claudication ([place]) 9. Induration of skin 10. Abrasion of skin of left lower leg Pt has abrasions on lower left leg. States that she picks at her scabs. Will consider referral to wound management  The above assessment and management plan was discussed with the patient. The patient verbalized understanding of and has agreed to the management plan using shared-decision making. Patient is aware to call the clinic if they develop any new symptoms or if symptoms fail to improve or worsen. Patient is aware when to return to the clinic for a follow-up visit. Patient educated on when it is appropriate to go to the emergency department.   Requested pt to follow up in 4 weeks for chronic condition follow up.   Donzetta Kohut, DNP-FNP [facility] [address] [place], [place]  [address] [phone]

## 2023-02-17 ENCOUNTER — Other Ambulatory Visit: Payer: Self-pay | Admitting: Family Medicine

## 2023-02-17 ENCOUNTER — Encounter (HOSPITAL_COMMUNITY): Payer: Self-pay

## 2023-02-17 ENCOUNTER — Telehealth: Payer: Self-pay | Admitting: Family Medicine

## 2023-02-17 ENCOUNTER — Emergency Department (HOSPITAL_COMMUNITY)
Admission: EM | Admit: 2023-02-17 | Discharge: 2023-02-17 | Disposition: A | Discharge status: Home / Self Care | Payer: 59 | Attending: Emergency Medicine | Admitting: Emergency Medicine

## 2023-02-17 ENCOUNTER — Other Ambulatory Visit: Payer: Self-pay

## 2023-02-17 DIAGNOSIS — E875 Hyperkalemia: Secondary | ICD-10-CM | POA: Insufficient documentation

## 2023-02-17 DIAGNOSIS — Z7982 Long term (current) use of aspirin: Secondary | ICD-10-CM | POA: Insufficient documentation

## 2023-02-17 DIAGNOSIS — D72829 Elevated white blood cell count, unspecified: Secondary | ICD-10-CM | POA: Diagnosis not present

## 2023-02-17 LAB — CMP14+EGFR
ALT: 23 IU/L (ref 0–32)
AST: 31 IU/L (ref 0–40)
Albumin/Globulin Ratio: 1.4 (ref 1.2–2.2)
Albumin: 4 g/dL (ref 3.8–4.8)
Alkaline Phosphatase: 79 IU/L (ref 44–121)
BUN/Creatinine Ratio: 25 (ref 12–28)
BUN: 35 mg/dL — ABNORMAL HIGH (ref 8–27)
Bilirubin Total: 0.2 mg/dL (ref 0.0–1.2)
CO2: 16 mmol/L — ABNORMAL LOW (ref 20–29)
Calcium: 9 mg/dL (ref 8.7–10.3)
Chloride: 108 mmol/L — ABNORMAL HIGH (ref 96–106)
Creatinine, Ser: 1.39 mg/dL — ABNORMAL HIGH (ref 0.57–1.00)
Globulin, Total: 2.9 g/dL (ref 1.5–4.5)
Glucose: 74 mg/dL (ref 70–99)
Potassium: 6.6 mmol/L (ref 3.5–5.2)
Sodium: 140 mmol/L (ref 134–144)
Total Protein: 6.9 g/dL (ref 6.0–8.5)
eGFR: 41 mL/min/{1.73_m2} — ABNORMAL LOW (ref >59)

## 2023-02-17 LAB — BASIC METABOLIC PANEL
Anion gap: 10 (ref 5–15)
BUN: 35 mg/dL — ABNORMAL HIGH (ref 8–23)
CO2: 17 mmol/L — ABNORMAL LOW (ref 22–32)
Calcium: 8.1 mg/dL — ABNORMAL LOW (ref 8.9–10.3)
Chloride: 107 mmol/L (ref 98–111)
Creatinine, Ser: 1.29 mg/dL — ABNORMAL HIGH (ref 0.44–1.00)
GFR, Estimated: 44 mL/min — ABNORMAL LOW (ref >60)
Glucose, Bld: 161 mg/dL — ABNORMAL HIGH (ref 70–99)
Potassium: 5.2 mmol/L — ABNORMAL HIGH (ref 3.5–5.1)
Sodium: 134 mmol/L — ABNORMAL LOW (ref 135–145)

## 2023-02-17 LAB — CBC WITH DIFFERENTIAL/PLATELET
Abs Immature Granulocytes: 0 10*3/uL (ref 0.00–0.07)
Basophils Absolute: 0 10*3/uL (ref 0.0–0.1)
Basophils Relative: 0 %
Eosinophils Absolute: 0.7 10*3/uL — ABNORMAL HIGH (ref 0.0–0.5)
Eosinophils Relative: 4 %
HCT: 30.6 % — ABNORMAL LOW (ref 36.0–46.0)
Hemoglobin: 8.6 g/dL — ABNORMAL LOW (ref 12.0–15.0)
Lymphocytes Relative: 5 %
Lymphs Abs: 0.9 10*3/uL (ref 0.7–4.0)
MCH: 23.8 pg — ABNORMAL LOW (ref 26.0–34.0)
MCHC: 28.1 g/dL — ABNORMAL LOW (ref 30.0–36.0)
MCV: 84.5 fL (ref 80.0–100.0)
Monocytes Absolute: 0.3 10*3/uL (ref 0.1–1.0)
Monocytes Relative: 2 %
Neutro Abs: 15.4 10*3/uL — ABNORMAL HIGH (ref 1.7–7.7)
Neutrophils Relative %: 89 %
Platelets: 519 10*3/uL — ABNORMAL HIGH (ref 150–400)
RBC: 3.62 MIL/uL — ABNORMAL LOW (ref 3.87–5.11)
RDW: 19.4 % — ABNORMAL HIGH (ref 11.5–15.5)
WBC Morphology: REACTIVE
WBC: 17.3 10*3/uL — ABNORMAL HIGH (ref 4.0–10.5)
nRBC: 0 % (ref 0.0–0.2)

## 2023-02-17 MED ORDER — SODIUM ZIRCONIUM CYCLOSILICATE 5 G PO PACK
5.0000 g | PACK | Freq: Once | ORAL | Status: AC
  Administered 2023-02-17: 5 g via ORAL
  Filled 2023-02-17: qty 1

## 2023-02-17 NOTE — Discharge Instructions (Signed)
You are seen in the emergency department for evaluation of elevated potassium.  We repeated your potassium level and it was just slightly elevated.  Please keep well-hydrated.  Follow-up with your regular doctor for repeat lab work.  Return to the emergency department if any worsening or concerning symptoms.

## 2023-02-17 NOTE — Telephone Encounter (Signed)
Error. See Result Management Note

## 2023-02-17 NOTE — ED Triage Notes (Signed)
Pt presents to ED from home C/O abnormal labs, K+ 6.6.

## 2023-02-17 NOTE — Telephone Encounter (Signed)
[facility] [employer] calling with critical lab result.  Potassium 6.6

## 2023-02-17 NOTE — ED Provider Notes (Signed)
[facility] Provider Note   CSN: [id number] Arrival date & time: 02/17/23  1337     History {Add pertinent medical, surgical, social history, OB history to HPI:1} No chief complaint on file.   [name] is a 72 y.o. female.  She was sent in by her primary care doctor for evaluation of elevated potassium.  She said she saw a new primary care doctor yesterday and they did routine blood work and called her today to that her potassium was elevated.  She denies any acute complaints.  She said she has had high potassiums before.  She has multiple medical problems but denies any acute complaints.  She is not on any potassium supplements.  She denies any chest pain syncope shortness of breath.  The history is provided by the patient.       Home Medications Prior to Admission medications   Medication Sig Start Date End Date Taking? Authorizing Provider  acetaminophen (TYLENOL) 500 MG tablet Take 2 tablets (1,000 mg total) by mouth every 6 (six) hours. 09/16/18   Norm Parcel, PA-C  apixaban (ELIQUIS) 5 MG TABS tablet Take 1 tablet (5 mg total) by mouth 2 (two) times daily. 01/20/23   Janora Norlander, DO  ARTIFICIAL TEAR OP Place 1 drop into both eyes as needed (for dry eyes).    [provider]  aspirin EC 325 MG tablet Take 325 mg by mouth daily.    [provider]  diclofenac Sodium (VOLTAREN) 1 % GEL Apply 2 g topically 4 (four) times daily. Patient taking differently: Apply 2 g topically 4 (four) times daily as needed (pain). 09/03/21   Ivy Lynn, NP  diltiazem (CARDIZEM CD) 240 MG 24 hr capsule TAKE 1 CAPSULE EVERY MORNING 08/20/22   Ivy Lynn, NP  escitalopram (LEXAPRO) 20 MG tablet TAKE ONE TABLET ONCE DAILY Patient taking differently: Take 20 mg by mouth daily. 11/03/22   Ivy Lynn, NP  ezetimibe (ZETIA) 10 MG tablet TAKE ONE TABLET ONCE DAILY 12/18/22   Ivy Lynn, NP  fenofibrate  (TRICOR) 145 MG tablet Take 1 tablet (145 mg total) by mouth daily. 01/20/23   Janora Norlander, DO  flecainide (TAMBOCOR) 50 MG tablet Take 1 tablet (50 mg total) by mouth 2 (two) times daily. 01/20/23   Janora Norlander, DO  hydroxyurea (HYDREA) 500 MG capsule TAKE 1 [place] SUNDAYS Patient taking differently: Take 500 mg by mouth See admin instructions. Take 1 capsule with food daily except on Sundays. 09/30/22   Truitt Merle, MD  Multiple Vitamins-Minerals (CENTRUM SILVER ADULT 50+ PO) Take by mouth.    [provider]  omeprazole (PRILOSEC) 20 MG capsule TAKE ONE CAPSULE TWICE DAILY BEFORE MEALS 12/18/22   Ivy Lynn, NP  traMADol (ULTRAM) 50 MG tablet Take 1 tablet (50 mg total) by mouth 2 (two) times daily. 02/16/23 03/18/23  Pryor Ochoa, FNP  triamcinolone cream (KENALOG) 0.1 % Apply 1 Application topically 2 (two) times daily. 02/09/23   Baruch Gouty, FNP  valsartan (DIOVAN) 160 MG tablet TAKE ONE TABLET EVERY EVENING (WITH '320MG'$  IN THE MORNING) 12/17/22   Lorretta Harp, MD  valsartan (DIOVAN) 320 MG tablet TAKE ONE TABLET EVERY MORNING (WITH '160MG'$  EVERY EVENING) 12/17/22   Lorretta Harp, MD      Allergies    Atenolol, Demerol  [meperidine hcl], Demerol [meperidine], Gabapentin, Inderal [propranolol], Prednisone, Statins, Warfarin and  related, Crestor [rosuvastatin], Docosahexaenoic acid (dha), Lactose intolerance (gi), Lipitor [atorvastatin], Lovaza [omega-3-acid ethyl esters], Penicillins, Pravastatin, Simvastatin, Trilipix [choline fenofibrate], Warfarin, Hydralazine, Effexor [venlafaxine hcl], Nexium [esomeprazole magnesium], Percocet [oxycodone-acetaminophen], and Plavix [clopidogrel bisulfate]    Review of Systems   Review of Systems  Constitutional:  Negative for fever.  Respiratory:  Negative for shortness of breath.   Cardiovascular:  Negative for chest pain.    Physical Exam Updated Vital Signs BP 111/82   Pulse  94   Temp 97.9 F (36.6 C) (Oral)   Resp 18   Ht '5\' 4"'$  (1.626 m)   Wt 59.9 kg   SpO2 98%   BMI 22.66 kg/m  Physical Exam Vitals and nursing note reviewed.  Constitutional:      General: She is not in acute distress.    Appearance: Normal appearance. She is well-developed.  HENT:     Head: Normocephalic and atraumatic.  Eyes:     Conjunctiva/sclera: Conjunctivae normal.  Cardiovascular:     Rate and Rhythm: Normal rate and regular rhythm.     Heart sounds: No murmur heard. Pulmonary:     Effort: Pulmonary effort is normal. No respiratory distress.     Breath sounds: Normal breath sounds.  Abdominal:     Palpations: Abdomen is soft.     Tenderness: There is no abdominal tenderness. There is no guarding or rebound.  Musculoskeletal:     Cervical back: Neck supple.  Skin:    General: Skin is warm and dry.     Capillary Refill: Capillary refill takes less than 2 seconds.  Neurological:     Mental Status: She is alert.     ED Results / Procedures / Treatments   Labs (all labs ordered are listed, but only abnormal results are displayed) Labs Reviewed  BASIC METABOLIC PANEL - Abnormal; Notable for the following components:      Result Value   Sodium 134 (*)    Potassium 5.2 (*)    CO2 17 (*)    Glucose, Bld 161 (*)    BUN 35 (*)    Creatinine, Ser 1.29 (*)    Calcium 8.1 (*)    GFR, Estimated 44 (*)    All other components within normal limits  CBC WITH DIFFERENTIAL/PLATELET - Abnormal; Notable for the following components:   WBC 17.3 (*)    RBC 3.62 (*)    Hemoglobin 8.6 (*)    HCT 30.6 (*)    MCH 23.8 (*)    MCHC 28.1 (*)    RDW 19.4 (*)    Platelets 519 (*)    Neutro Abs 15.4 (*)    Eosinophils Absolute 0.7 (*)    All other components within normal limits    EKG None  Radiology No results found.  Procedures Procedures  {Document cardiac monitor, telemetry assessment procedure when appropriate:1}  Medications Ordered in ED Medications  sodium  zirconium cyclosilicate (LOKELMA) packet 5 g (has no administration in time range)    ED Course/ Medical Decision Making/ A&P   {   Click here for ABCD2, HEART and other calculatorsREFRESH Note before signing :1}                          Medical Decision Making Risk Prescription drug management.   This patient complains of ***; this involves an extensive number of treatment Options and is a complaint that carries with it a high risk of complications and morbidity. The differential includes ***  I ordered, reviewed and interpreted labs, which included *** I ordered medication *** and reviewed PMP when indicated. I ordered imaging studies which included *** and I independently    visualized and interpreted imaging which showed *** Additional history obtained from *** Previous records obtained and reviewed *** I consulted *** and discussed lab and imaging findings and discussed disposition.  Cardiac monitoring reviewed, *** Social determinants considered, *** Critical Interventions: ***  After the interventions stated above, I reevaluated the patient and found *** Admission and further testing considered, ***   {Document critical care time when appropriate:1} {Document review of labs and clinical decision tools ie heart score, Chads2Vasc2 etc:1}  {Document your independent review of radiology images, and any outside records:1} {Document your discussion with family members, caretakers, and with consultants:1} {Document social determinants of health affecting pt's care:1} {Document your decision making why or why not admission, treatments were needed:1} Final Clinical Impression(s) / ED Diagnoses Final diagnoses:  None    Rx / DC Orders ED Discharge Orders     None

## 2023-02-17 NOTE — Progress Notes (Signed)
Called pt and instructed her to go to hospital for IV management of critical potassium.

## 2023-02-17 NOTE — ED Provider Triage Note (Signed)
Emergency Medicine Provider Triage Evaluation Note  [name] , a 72 y.o. female  was evaluated in triage.  Pt complains of abnormal lab.  States that she went to her primary care yesterday who drew lab work and called her this morning around 730 stating that her potassium was 6.6.  She states that this has happened previously but she is unsure why.  She denies being on dialysis.  She denies having any chest pain or abdominal pain or nausea.  Has been taking her medications as prescribed..  Review of Systems  Positive: See above Negative:   Physical Exam  BP 111/82   Pulse 94   Temp 97.9 F (36.6 C) (Oral)   Resp 18   Ht '5\' 4"'$  (1.626 m)   Wt 59.9 kg   SpO2 98%   BMI 22.66 kg/m  Gen:   Awake, no distress   Resp:  Normal effort  MSK:   Moves extremities without difficulty  Other:    Medical Decision Making  Medically screening exam initiated at 2:25 PM.  Appropriate orders placed.  Hanya B Vitacco was informed that the remainder of the evaluation will be completed by another provider, this initial triage assessment does not replace that evaluation, and the importance of remaining in the ED until their evaluation is complete.     Mickie Hillier, PA-C 02/17/23 1426

## 2023-02-18 LAB — TOXASSURE SELECT 13 (MW), URINE

## 2023-02-24 ENCOUNTER — Ambulatory Visit (INDEPENDENT_AMBULATORY_CARE_PROVIDER_SITE_OTHER): Payer: 59 | Admitting: Vascular Surgery

## 2023-02-24 ENCOUNTER — Encounter: Payer: Self-pay | Admitting: Vascular Surgery

## 2023-02-24 VITALS — BP 128/77 | HR 70 | Temp 97.2°F | Ht 64.0 in | Wt 128.0 lb

## 2023-02-24 DIAGNOSIS — I6523 Occlusion and stenosis of bilateral carotid arteries: Secondary | ICD-10-CM | POA: Diagnosis not present

## 2023-02-24 NOTE — Progress Notes (Signed)
[facility]  Patient name: [name] MRN: [id number] DOB: 1951-02-12 Sex: female  REASON FOR VISIT: Follow-up known carotid stenosis  HPI: [name] is a 72 y.o. female here today for follow-up.  Well-known to me from prior treatment of carotid and peripheral vascular disease.  She actually looks good today.  She walks with a walker due to her right above-knee amputation.  He denies any specific neurologic deficits.  I had an office visit with her 6 months ago at which time she had a CT angiogram showing left 60% carotid stenosis.  She has known left subclavian stenosis.  Past Medical History:  Diagnosis Date   Acute blood loss anemia 05/04/2020   Arthritis    "fingers, some in my knees" (01/28/2016)   CAD (coronary artery disease), per cath 04/27/13, non obstructive disease, EF 65-70% 05/11/2013   Carotid disease, bilateral ([place]) 09/20/2013   Lt carotid 50-69% stentosis, less on rt   Cellulitis of left leg 01/16/2022   Dysrhythmia    Elevated troponin 05/04/2020   Encounter for support and coordination of transition of care 11/11/2021   Fatty liver    GERD (gastroesophageal reflux disease)    Glaucoma    H/O cardiovascular stress test 12/27/2009   negative for ischemia   Headache    "occasional since eye pressure regulated" (01/28/2016)   Hospital discharge follow-up 10/15/2021   Hyperlipidemia    Hypertension    Hypertensive emergency 11/28/2022   Leukocytosis 07/15/2015   Migraine    "stopped w/laser holes to relieve pressure in my eyes; had them 3-4 times/wk before taht" (01/28/2016)   Mitral regurgitation,2+ by cath 05/11/2013   NSTEMI (non-ST elevated myocardial infarction) ([place]) 04/27/2013   Pain    BOTH KNEES - PT HAS TORN MENISCUS LEFT KNEE   Pain in both wrists 09/03/2021   Pain in left knee 10/03/2018   Paroxysmal atrial fibrillation ([facility])    hx of a fib on ASA  AND ELIQUIS    Polycythemia vera  ([place]) 08/20/2015   JAK-2 positive 07/19/15   Prosthesis adjustment 01/13/2019   PVD (peripheral vascular disease) with claudication ([place]) 07/21/2006   previous L ext iliac stent and rt SFA stent, known occluded Lt SFA   S/P cardiac cath 04/27/2013   NON OBSTRUCTIVE DISEASE, 2+ mr, ef 65-70%   Statin intolerance    Stroke ([place]) 12/21/2004   SWELLING ALL OVER AND RT SIDE OF FACE DRAWN AND SPEECH SLURRED AND NUMBNESS ON RIGHT SIDE-- ALL RESOLVED   TIA (transient ischemic attack)     Family History  Problem Relation Age of Onset   CAD Mother    Lung cancer Mother    Heart attack Mother    CAD Father    Heart disease Father    Heart attack Father 36       died in hes 5's with heart disease   Healthy Sister    Liver cancer Brother    Healthy Sister    CAD Brother        has had CABG   CAD Brother     SOCIAL HISTORY: Social History   Tobacco Use   Smoking status: Former    Packs/day: 1.00    Years: 45.00    Total pack years: 45.00    Types: Cigarettes    Quit date: 11/11/2012    Years since quitting: 10.2   Smokeless tobacco: Never   Tobacco comments:    quit 4 years ago.  10/27/21-Pt declines lung ca screen at this time.   Substance Use Topics   Alcohol use: No    Alcohol/week: 0.0 standard drinks of alcohol    Allergies  Allergen Reactions   Atenolol Rash and Itching   Demerol  [Meperidine Hcl] Itching   Demerol [Meperidine] Other (See Comments) and Itching    Unknown, can't remember  Unknown, can't remember    Gabapentin Anxiety and Other (See Comments)    SI and HI   Inderal [Propranolol] Other (See Comments) and Itching    Doesn't remember    Prednisone Other (See Comments)    Muscle spasms   Statins Itching and Other (See Comments)    Simvastatin-caused severe itching Pravastatin-caused lesser tiching One type of statin? Caused stroke in 2006, and other symptoms as result   Warfarin And Related Other (See Comments)    Caused nose bleeds   Crestor  [Rosuvastatin] Itching and Rash   Docosahexaenoic Acid (Dha)     Other reaction(s): Other (See Comments) nosebleeds   Lactose Intolerance (Gi) Other (See Comments)    Bloating, gas   Lipitor [Atorvastatin] Rash   Lovaza [Omega-3-Acid Ethyl Esters] Other (See Comments)    nosebleeds    Penicillins Hives, Rash and Other (See Comments)    Has patient had a PCN reaction causing immediate rash, facial/tongue/throat swelling, SOB or lightheadedness with hypotension: Yes Has patient had a PCN reaction causing severe rash involving mucus membranes or skin necrosis: No Has patient had a PCN reaction that required hospitalization: No Has patient had a PCN reaction occurring within the last 10 years: No If all of the above answers are "NO", then may proceed with Cephalosporin use.  Questionable high fever    Pravastatin Itching and Rash   Simvastatin Itching, Rash and Other (See Comments)   Trilipix [Choline Fenofibrate] Other (See Comments)    Doesn't remember    Warfarin Other (See Comments)    nosebleeds   Hydralazine Swelling   Effexor [Venlafaxine Hcl] Other (See Comments)    Doesn't remember    Nexium [Esomeprazole Magnesium] Rash   Percocet [Oxycodone-Acetaminophen] Other (See Comments)    Doesn't remember    Plavix [Clopidogrel Bisulfate] Rash    Current Outpatient Medications  Medication Sig Dispense Refill   acetaminophen (TYLENOL) 500 MG tablet Take 2 tablets (1,000 mg total) by mouth every 6 (six) hours. 30 tablet 0   apixaban (ELIQUIS) 5 MG TABS tablet Take 1 tablet (5 mg total) by mouth 2 (two) times daily. 60 tablet 5   ARTIFICIAL TEAR OP Place 1 drop into both eyes as needed (for dry eyes).     aspirin EC 325 MG tablet Take 325 mg by mouth daily.     diclofenac Sodium (VOLTAREN) 1 % GEL Apply 2 g topically 4 (four) times daily. (Patient taking differently: Apply 2 g topically 4 (four) times daily as needed (pain).) 50 g 0   diltiazem (CARDIZEM CD) 240 MG 24 hr capsule  TAKE 1 CAPSULE EVERY MORNING 30 capsule 5   escitalopram (LEXAPRO) 20 MG tablet TAKE ONE TABLET ONCE DAILY 30 tablet 5   ezetimibe (ZETIA) 10 MG tablet TAKE ONE TABLET ONCE DAILY 30 tablet 2   fenofibrate (TRICOR) 145 MG tablet Take 1 tablet (145 mg total) by mouth daily. 30 tablet 5   flecainide (TAMBOCOR) 50 MG tablet Take 1 tablet (50 mg total) by mouth 2 (two) times daily. 60 tablet 5   hydroxyurea (HYDREA) 500 MG capsule TAKE 1 CAPSULE WITH FOOD EVERY  DAY EXCEPT SUNDAYS (Patient taking differently: Take 500 mg by mouth See admin instructions. Take 1 capsule with food daily except on Sundays.) 30 capsule 3   Multiple Vitamins-Minerals (CENTRUM SILVER ADULT 50+ PO) Take by mouth.     omeprazole (PRILOSEC) 20 MG capsule TAKE ONE CAPSULE TWICE DAILY BEFORE MEALS 180 capsule 0   traMADol (ULTRAM) 50 MG tablet Take 1 tablet (50 mg total) by mouth 2 (two) times daily. 60 tablet 0   triamcinolone cream (KENALOG) 0.1 % Apply 1 Application topically 2 (two) times daily. 30 g 0   valsartan (DIOVAN) 160 MG tablet TAKE ONE TABLET EVERY EVENING (WITH '320MG'$  IN THE MORNING) 90 tablet 3   valsartan (DIOVAN) 320 MG tablet TAKE ONE TABLET EVERY MORNING (WITH '160MG'$  EVERY EVENING) 90 tablet 3   No current facility-administered medications for this visit.    REVIEW OF SYSTEMS:  '[X]'$  denotes positive finding, '[ ]'$  denotes negative finding Cardiac  Comments:  Chest pain or chest pressure:    Shortness of breath upon exertion:    Short of breath when lying flat:    Irregular heart rhythm:        Vascular    Pain in calf, thigh, or hip brought on by ambulation:    Pain in feet at night that wakes you up from your sleep:     Blood clot in your veins:    Leg swelling:           PHYSICAL EXAM: Vitals:   02/24/23 1044 02/24/23 1048  BP: (!) 193/74 128/77  Pulse: 70   Temp: (!) 97.2 F (36.2 C)   SpO2: 96%   Weight: 128 lb (58.1 kg)   Height: '5\' 4"'$  (1.626 m)     GENERAL: The patient is a  well-nourished female, in no acute distress. The vital signs are documented above. CARDIOVASCULAR: Harsh carotid bruits bilaterally.  2+ radial pulses bilaterally PULMONARY: There is good air exchange  MUSCULOSKELETAL: There are no major deformities or cyanosis. NEUROLOGIC: No focal weakness or paresthesias are detected. SKIN: There are no ulcers or rashes noted. PSYCHIATRIC: The patient has a normal affect.  DATA:  She has undergone carotid duplex at heart care in December 2023.  We reviewed this and this shows bilateral 40 to 59% carotid stenosis  MEDICAL ISSUES: Stable bilateral moderate carotid disease.  This is being followed at heart care.  She knows to notify us immediately should she develop symptoms.  We will see her again on an as-needed basis    Rosetta Posner, MD FACS [facility] Tel [phone]  Note: Portions of this report may have been transcribed using voice recognition software.  Every effort has been made to ensure accuracy; however, inadvertent computerized transcription errors may still be present.

## 2023-03-01 ENCOUNTER — Inpatient Hospital Stay: Payer: 59 | Attending: Hematology

## 2023-03-01 ENCOUNTER — Other Ambulatory Visit: Payer: Self-pay

## 2023-03-01 ENCOUNTER — Telehealth: Payer: Self-pay

## 2023-03-01 DIAGNOSIS — Z7901 Long term (current) use of anticoagulants: Secondary | ICD-10-CM | POA: Insufficient documentation

## 2023-03-01 DIAGNOSIS — Z79899 Other long term (current) drug therapy: Secondary | ICD-10-CM | POA: Diagnosis not present

## 2023-03-01 DIAGNOSIS — Z8673 Personal history of transient ischemic attack (TIA), and cerebral infarction without residual deficits: Secondary | ICD-10-CM | POA: Diagnosis not present

## 2023-03-01 DIAGNOSIS — I480 Paroxysmal atrial fibrillation: Secondary | ICD-10-CM | POA: Diagnosis not present

## 2023-03-01 DIAGNOSIS — D45 Polycythemia vera: Secondary | ICD-10-CM | POA: Insufficient documentation

## 2023-03-01 DIAGNOSIS — I252 Old myocardial infarction: Secondary | ICD-10-CM | POA: Diagnosis not present

## 2023-03-01 DIAGNOSIS — D509 Iron deficiency anemia, unspecified: Secondary | ICD-10-CM | POA: Diagnosis present

## 2023-03-01 DIAGNOSIS — Z86718 Personal history of other venous thrombosis and embolism: Secondary | ICD-10-CM | POA: Diagnosis not present

## 2023-03-01 DIAGNOSIS — Z7982 Long term (current) use of aspirin: Secondary | ICD-10-CM | POA: Diagnosis not present

## 2023-03-01 LAB — CBC WITH DIFFERENTIAL (CANCER CENTER ONLY)
Abs Immature Granulocytes: 0.15 10*3/uL — ABNORMAL HIGH (ref 0.00–0.07)
Basophils Absolute: 0.2 10*3/uL — ABNORMAL HIGH (ref 0.0–0.1)
Basophils Relative: 1 %
Eosinophils Absolute: 0.5 10*3/uL (ref 0.0–0.5)
Eosinophils Relative: 3 %
HCT: 32 % — ABNORMAL LOW (ref 36.0–46.0)
Hemoglobin: 9.4 g/dL — ABNORMAL LOW (ref 12.0–15.0)
Immature Granulocytes: 1 %
Lymphocytes Relative: 6 %
Lymphs Abs: 0.9 10*3/uL (ref 0.7–4.0)
MCH: 23.9 pg — ABNORMAL LOW (ref 26.0–34.0)
MCHC: 29.4 g/dL — ABNORMAL LOW (ref 30.0–36.0)
MCV: 81.2 fL (ref 80.0–100.0)
Monocytes Absolute: 0.3 10*3/uL (ref 0.1–1.0)
Monocytes Relative: 2 %
Neutro Abs: 14.2 10*3/uL — ABNORMAL HIGH (ref 1.7–7.7)
Neutrophils Relative %: 87 %
Platelet Count: 266 10*3/uL (ref 150–400)
RBC: 3.94 MIL/uL (ref 3.87–5.11)
RDW: 18.4 % — ABNORMAL HIGH (ref 11.5–15.5)
WBC Count: 16.2 10*3/uL — ABNORMAL HIGH (ref 4.0–10.5)
nRBC: 0.1 % (ref 0.0–0.2)

## 2023-03-01 LAB — IRON AND IRON BINDING CAPACITY (CC-WL,HP ONLY)
Iron: 51 ug/dL (ref 28–170)
Saturation Ratios: 10 % — ABNORMAL LOW (ref 10.4–31.8)
TIBC: 536 ug/dL — ABNORMAL HIGH (ref 250–450)
UIBC: 485 ug/dL — ABNORMAL HIGH (ref 148–442)

## 2023-03-01 LAB — FERRITIN: Ferritin: 15 ng/mL (ref 11–307)

## 2023-03-01 NOTE — Telephone Encounter (Signed)
Spoke with pt today in the waiting room of [facility] after she had labs drawn. She asks if she can stop Hydrea to give her LLE time to heal. Advised pt I would consult with MD and call her with recommendation. Per MD it is OK to stop Hydrea until visit in April and she was advised she would likely need a phlebotomy at that visit. She verbalized understanding and agreement. She knows to call should she have any concerns.

## 2023-03-02 ENCOUNTER — Telehealth: Payer: Self-pay | Admitting: Hematology

## 2023-03-02 ENCOUNTER — Encounter: Payer: Self-pay | Admitting: Cardiovascular Disease

## 2023-03-02 ENCOUNTER — Inpatient Hospital Stay (HOSPITAL_BASED_OUTPATIENT_CLINIC_OR_DEPARTMENT_OTHER): Payer: 59 | Admitting: Hematology

## 2023-03-02 ENCOUNTER — Ambulatory Visit: Payer: 59 | Attending: Cardiovascular Disease | Admitting: Cardiovascular Disease

## 2023-03-02 VITALS — BP 110/70 | HR 71 | Ht 64.5 in | Wt 128.0 lb

## 2023-03-02 DIAGNOSIS — I2510 Atherosclerotic heart disease of native coronary artery without angina pectoris: Secondary | ICD-10-CM | POA: Diagnosis not present

## 2023-03-02 DIAGNOSIS — I6529 Occlusion and stenosis of unspecified carotid artery: Secondary | ICD-10-CM

## 2023-03-02 DIAGNOSIS — I10 Essential (primary) hypertension: Secondary | ICD-10-CM

## 2023-03-02 DIAGNOSIS — D45 Polycythemia vera: Secondary | ICD-10-CM | POA: Diagnosis not present

## 2023-03-02 DIAGNOSIS — I739 Peripheral vascular disease, unspecified: Secondary | ICD-10-CM

## 2023-03-02 DIAGNOSIS — E782 Mixed hyperlipidemia: Secondary | ICD-10-CM

## 2023-03-02 DIAGNOSIS — I480 Paroxysmal atrial fibrillation: Secondary | ICD-10-CM

## 2023-03-02 NOTE — Assessment & Plan Note (Signed)
History of PAF maintaining sinus rhythm on Eliquis oral anticoagulation and flecainide.

## 2023-03-02 NOTE — Assessment & Plan Note (Signed)
History of essential hypertension blood pressure measured today at 110/70.  She is on diltiazem and valsartan.

## 2023-03-02 NOTE — Assessment & Plan Note (Signed)
History of PAD status post left external iliac artery PTA and stenting as well as right SFA stenting by myself August 2007.  She has a known occluded left SFA.  She has had multiple interventions on her right SFA including Viabahn stenting for "in-stent restenosis with documentation of acute stent occlusion requiring urgent right femoropopliteal bypass grafting by Dr. Kellie Simmering and ultimate thrombectomy of the thrombosed graft by Dr. Oneida Alar 01/09/2017.  She also underwent right BKA 05/21/2017.  She is wearing a prosthesis and ambulates with a walker.  Her most recent Doppler studies performed 12/10/2022 revealed a left ABI of 0.43 with moderately elevated velocities in both external iliac arteries.

## 2023-03-02 NOTE — Telephone Encounter (Signed)
Contacted patient to scheduled appointments. Patient is aware of appointments that are scheduled.   

## 2023-03-02 NOTE — Patient Instructions (Addendum)
Medication Instructions:  Your physician recommends that you continue on your current medications as directed. Please refer to the Current Medication list given to you today.  *If you need a refill on your cardiac medications before your next appointment, please call your pharmacy*   Testing/Procedures: Your physician has requested that you have a carotid duplex. This test is an ultrasound of the carotid arteries in your neck. It looks at blood flow through these arteries that supply the brain with blood. Allow one hour for this exam. There are no restrictions or special instructions. This will take place at [address], [address]. To be done in December.  Your physician has requested that you have an Aorta/Iliac Duplex. This will be take place at [place], [address].  No food after 11PM the night before.  Water is OK. (Don't drink liquids if you have been instructed not to for ANOTHER test) Avoid foods that produce bowel gas, for 24 hours prior to exam (see below). No breakfast, no chewing gum, no smoking or carbonated beverages. Patient may take morning medications with water. Come in for test at least 15 minutes early to register. To be done in December.  Your physician has requested that you have an ankle brachial index (ABI). During this test an ultrasound and blood pressure cuff are used to evaluate the arteries that supply the arms and legs with blood. Allow thirty minutes for this exam. There are no restrictions or special instructions. This will take place at [place], [address].  To be done in December.      Follow-Up: At [facility], you and your health needs are our priority.  As part of our continuing mission to provide you with exceptional heart care, we have created designated Provider Care Teams.  These Care Teams include your primary Cardiologist (physician) and Advanced Practice Providers (APPs -  Physician Assistants and Nurse  Practitioners) who all work together to provide you with the care you need, when you need it.  We recommend signing up for the patient portal called "MyChart".  Sign up information is provided on this After Visit Summary.  MyChart is used to connect with patients for Virtual Visits (Telemedicine).  Patients are able to view lab/test results, encounter notes, upcoming appointments, etc.  Non-urgent messages can be sent to your provider as well.   To learn more about what you can do with MyChart, go to [email or web address].    Your next appointment:   12 month(s)  Provider:   Quay Burow, MD

## 2023-03-02 NOTE — Assessment & Plan Note (Signed)
History of hyperlipidemia on Zetia and fenofibrate (statin intolerant) with lipid profile performed 10/09/2022 revealing total cholesterol 162, LDL 89 HDL 40.
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
## 2023-03-02 NOTE — Assessment & Plan Note (Signed)
History of nonobstructive CAD by cardiac cath performed by Dr. Ellyn Hack 04/27/2013.  She denies chest pain

## 2023-03-02 NOTE — Assessment & Plan Note (Signed)
>>  ASSESSMENT AND PLAN FOR MIXED HYPERLIPIDEMIA WRITTEN ON 03/02/2023 10:20 AM BY BERRY, JONATHAN J, MD  History of hyperlipidemia on Zetia  and fenofibrate  (statin intolerant) with lipid profile performed 10/09/2022 revealing total cholesterol 162, LDL 89 HDL 40.

## 2023-03-05 ENCOUNTER — Encounter: Payer: Self-pay | Admitting: Hematology
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
## 2023-03-11 ENCOUNTER — Other Ambulatory Visit: Payer: Self-pay | Admitting: Hematology

## 2023-03-11 ENCOUNTER — Other Ambulatory Visit: Payer: Self-pay | Admitting: Family Medicine

## 2023-03-11 DIAGNOSIS — I10 Essential (primary) hypertension: Secondary | ICD-10-CM

## 2023-03-11 DIAGNOSIS — I214 Non-ST elevation (NSTEMI) myocardial infarction: Secondary | ICD-10-CM

## 2023-03-11 DIAGNOSIS — Z8673 Personal history of transient ischemic attack (TIA), and cerebral infarction without residual deficits: Secondary | ICD-10-CM

## 2023-03-11 NOTE — Telephone Encounter (Signed)
Stopped per Dr. Burr Medico d/t anemia and wound to legs

## 2023-04-01 ENCOUNTER — Encounter: Payer: Self-pay | Admitting: Hematology

## 2023-04-15 ENCOUNTER — Inpatient Hospital Stay (HOSPITAL_BASED_OUTPATIENT_CLINIC_OR_DEPARTMENT_OTHER): Payer: 59 | Admitting: Hematology

## 2023-04-15 ENCOUNTER — Inpatient Hospital Stay: Payer: 59

## 2023-04-15 ENCOUNTER — Encounter: Payer: Self-pay | Admitting: Hematology

## 2023-04-15 ENCOUNTER — Inpatient Hospital Stay: Payer: 59 | Attending: Hematology

## 2023-04-15 VITALS — BP 109/68 | HR 79 | Temp 98.7°F | Resp 16 | Ht 64.5 in | Wt 130.6 lb

## 2023-04-15 DIAGNOSIS — D500 Iron deficiency anemia secondary to blood loss (chronic): Secondary | ICD-10-CM | POA: Insufficient documentation

## 2023-04-15 DIAGNOSIS — D75839 Thrombocytosis, unspecified: Secondary | ICD-10-CM | POA: Diagnosis not present

## 2023-04-15 DIAGNOSIS — Z79899 Other long term (current) drug therapy: Secondary | ICD-10-CM | POA: Diagnosis not present

## 2023-04-15 DIAGNOSIS — D45 Polycythemia vera: Secondary | ICD-10-CM | POA: Diagnosis present

## 2023-04-15 DIAGNOSIS — Z7982 Long term (current) use of aspirin: Secondary | ICD-10-CM | POA: Insufficient documentation

## 2023-04-15 DIAGNOSIS — Z791 Long term (current) use of non-steroidal anti-inflammatories (NSAID): Secondary | ICD-10-CM | POA: Insufficient documentation

## 2023-04-15 DIAGNOSIS — D509 Iron deficiency anemia, unspecified: Secondary | ICD-10-CM | POA: Insufficient documentation

## 2023-04-15 LAB — CBC WITH DIFFERENTIAL (CANCER CENTER ONLY)
Abs Immature Granulocytes: 0.41 10*3/uL — ABNORMAL HIGH (ref 0.00–0.07)
Basophils Absolute: 0.2 10*3/uL — ABNORMAL HIGH (ref 0.0–0.1)
Basophils Relative: 1 %
Eosinophils Absolute: 0.7 10*3/uL — ABNORMAL HIGH (ref 0.0–0.5)
Eosinophils Relative: 3 %
HCT: 37.8 % (ref 36.0–46.0)
Hemoglobin: 10.7 g/dL — ABNORMAL LOW (ref 12.0–15.0)
Immature Granulocytes: 2 %
Lymphocytes Relative: 6 %
Lymphs Abs: 1.5 10*3/uL (ref 0.7–4.0)
MCH: 21.9 pg — ABNORMAL LOW (ref 26.0–34.0)
MCHC: 28.3 g/dL — ABNORMAL LOW (ref 30.0–36.0)
MCV: 77.5 fL — ABNORMAL LOW (ref 80.0–100.0)
Monocytes Absolute: 0.7 10*3/uL (ref 0.1–1.0)
Monocytes Relative: 3 %
Neutro Abs: 19.5 10*3/uL — ABNORMAL HIGH (ref 1.7–7.7)
Neutrophils Relative %: 85 %
Platelet Count: 605 10*3/uL — ABNORMAL HIGH (ref 150–400)
RBC: 4.88 MIL/uL (ref 3.87–5.11)
RDW: 18.3 % — ABNORMAL HIGH (ref 11.5–15.5)
WBC Count: 23 10*3/uL — ABNORMAL HIGH (ref 4.0–10.5)
nRBC: 0.3 % — ABNORMAL HIGH (ref 0.0–0.2)
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
## 2023-04-15 NOTE — Assessment & Plan Note (Signed)
Secondary to previous phlebotomy.  -Her HG was trending down since 06/27/20 at 10. -she has intermittently been on oral iron, last 01/2021. -Due to her recent anemia and iron deficiency, I recommend her to restart oral iron

## 2023-04-16 ENCOUNTER — Telehealth: Payer: Self-pay | Admitting: Hematology

## 2023-04-16 LAB — FERRITIN: Ferritin: 17 ng/mL (ref 11–307)

## 2023-04-16 NOTE — Telephone Encounter (Signed)
Contacted patient to scheduled appointments. Patient is aware of appointments that are scheduled.   

## 2023-04-19 ENCOUNTER — Other Ambulatory Visit: Payer: Self-pay | Admitting: Family Medicine

## 2023-04-19 DIAGNOSIS — K219 Gastro-esophageal reflux disease without esophagitis: Secondary | ICD-10-CM

## 2023-04-30 ENCOUNTER — Telehealth: Payer: Self-pay | Admitting: Family Medicine

## 2023-04-30 NOTE — Telephone Encounter (Signed)
Patient has reactions to statin medications.

## 2023-05-11 ENCOUNTER — Other Ambulatory Visit: Payer: Self-pay | Admitting: Family Medicine

## 2023-05-11 DIAGNOSIS — G894 Chronic pain syndrome: Secondary | ICD-10-CM

## 2023-05-13 ENCOUNTER — Encounter (HOSPITAL_BASED_OUTPATIENT_CLINIC_OR_DEPARTMENT_OTHER): Payer: 59 | Attending: General Surgery | Admitting: General Surgery

## 2023-05-13 DIAGNOSIS — I739 Peripheral vascular disease, unspecified: Secondary | ICD-10-CM | POA: Diagnosis not present

## 2023-05-13 DIAGNOSIS — Z87891 Personal history of nicotine dependence: Secondary | ICD-10-CM | POA: Insufficient documentation

## 2023-05-13 DIAGNOSIS — L97822 Non-pressure chronic ulcer of other part of left lower leg with fat layer exposed: Secondary | ICD-10-CM | POA: Diagnosis present

## 2023-05-13 DIAGNOSIS — Z8673 Personal history of transient ischemic attack (TIA), and cerebral infarction without residual deficits: Secondary | ICD-10-CM | POA: Insufficient documentation

## 2023-05-13 DIAGNOSIS — I4891 Unspecified atrial fibrillation: Secondary | ICD-10-CM | POA: Insufficient documentation

## 2023-05-13 DIAGNOSIS — Z7901 Long term (current) use of anticoagulants: Secondary | ICD-10-CM | POA: Diagnosis not present

## 2023-05-13 DIAGNOSIS — D45 Polycythemia vera: Secondary | ICD-10-CM | POA: Diagnosis not present

## 2023-05-13 DIAGNOSIS — Z89611 Acquired absence of right leg above knee: Secondary | ICD-10-CM | POA: Diagnosis not present

## 2023-05-13 DIAGNOSIS — I10 Essential (primary) hypertension: Secondary | ICD-10-CM | POA: Insufficient documentation

## 2023-05-19 NOTE — Progress Notes (Signed)
[name], [name] ([id number]) [phone].pdf Page 1 of 4 Visit Report for 05/13/2023 Abuse Risk Screen Details Patient Name: Date of Service: [place], [name] 05/13/2023 7:30 A M Medical Record Number: [id number] Patient Account Number: [id number] Date of Birth/Sex: Treating RN: 04/05/1951 (72 y.o. [name] Primary Care Gabryel Talamo: Ernestene Mention Other Clinician: Referring Makinzee Durley: Treating Ukiah Trawick/Extender: Minerva Ends, [place] BRIELLE Weeks in Treatment: 0 Abuse Risk Screen Items Answer ABUSE RISK SCREEN: Has anyone close to you tried to hurt or harm you recentlyo No Do you feel uncomfortable with anyone in your familyo No Has anyone forced you do things that you didnt want to doo No Electronic Signature(s) Signed: 05/19/2023 10:30:00 AM By: Brenton Grills Entered By: Brenton Grills on 05/13/2023 07:45:35 -------------------------------------------------------------------------------- Activities of Daily Living Details Patient Name: Date of Service: [name], [name] 05/13/2023 7:30 A M Medical Record Number: [id number] Patient Account Number: [id number] Date of Birth/Sex: Treating RN: 12/27/1950 (72 y.o. [name] Primary Care Julane Crock: Ernestene Mention Other Clinician: Referring Melanie Pellot: Treating Aiyanna Awtrey/Extender: Minerva Ends, [place] BRIELLE Weeks in Treatment: 0 Activities of Daily Living Items Answer Activities of Daily Living (Please select one for each item) Drive Automobile Completely Able T Medications ake Completely Able Use T elephone Completely Able Care for Appearance Completely Able Use T oilet Completely Able Bath / Shower Completely Able Dress Self Completely Able Feed Self Completely Able Walk Completely Able Get In / Out Bed Completely Able Housework Completely Able Prepare Meals Completely Able Handle Money Completely Able Shop for Self Completely Able Electronic  Signature(s) Signed: 05/19/2023 10:30:00 AM By: Brenton Grills Entered By: Brenton Grills on 05/13/2023 07:46:29 -------------------------------------------------------------------------------- Education Screening Details Patient Name: Date of Service: [name], [name] 05/13/2023 7:30 A M Medical Record Number: [id number] Patient Account Number: [id number] Date of Birth/Sex: Treating RN: 16-May-1951 (73 y.o. [name] Primary Care Dao Mearns: Ernestene Mention Other Clinician: Referring Kristoffer Bala: Treating Cyndal Kasson/Extender: Minerva Ends, [place] BRIELLE Weeks in TreatmentSOLANGIE, GANLEY [name] ([id number]) [phone].pdf Page 2 of 4 Learning Preferences/Education Level/Primary Language Highest Education Level: High School Preferred Language: Economist Language Barrier: No Translator Needed: No Memory Deficit: No Emotional Barrier: No Cultural/Religious Beliefs Affecting Medical Care: No Physical Barrier Impaired Vision: Yes Glasses Impaired Hearing: No Knowledge/Comprehension Knowledge Level: High Comprehension Level: High Ability to understand written instructions: High Ability to understand verbal instructions: High Motivation Anxiety Level: Calm Cooperation: Cooperative Education Importance: Acknowledges Need Interest in Health Problems: Asks Questions Perception: Coherent Willingness to Engage in Self-Management High Activities: Readiness to Engage in Self-Management High Activities: Electronic Signature(s) Signed: 05/19/2023 10:30:00 AM By: Brenton Grills Entered By: Brenton Grills on 05/13/2023 07:47:06 -------------------------------------------------------------------------------- Fall Risk Assessment Details Patient Name: Date of Service: Stacey [name], [place] TSY [name] 05/13/2023 7:30 A M Medical Record Number: [id number] Patient Account Number: [id number] Date of Birth/Sex: Treating RN: 27-Nov-1951 (72 y.o. [name] Primary Care [name]: Ernestene Mention Other Clinician: Referring Aayra Hornbaker: Treating Mozell Hardacre/Extender: Minerva Ends, [place] BRIELLE Weeks in Treatment: 0 Fall Risk Assessment Items Have you had 2 or more falls in the last 12 monthso 0 No Have you had any fall that resulted in injury in the last 12 monthso 0 Yes FALLS RISK SCREEN History of falling - immediate or within 3 months 0 No Secondary diagnosis (Do you have 2 or more medical diagnoseso) 0 No Ambulatory aid None/bed rest/wheelchair/nurse 0 No Crutches/cane/walker 0  No Furniture 0 No Intravenous therapy Access/Saline/Heparin Lock 0 No Gait/Transferring Normal/ bed rest/ wheelchair 0 No Weak (short steps with or without shuffle, stooped but able to lift head while walking, may seek 0 No support from furniture) Impaired (short steps with shuffle, may have difficulty arising from chair, head down, impaired 0 No balance) Mental Status Oriented to own ability 0 No Electronic Signature(s) Signed: 05/19/2023 10:30:00 AM By: Frederich Cha, Carlynn Spry ([id number]) [id number] Page 3 of 4 Entered By: Brenton Grills on 05/13/2023 07:47:43 -------------------------------------------------------------------------------- Foot Assessment Details Patient Name: Date of Service: [name], [name] 05/13/2023 7:30 A M Medical Record Number: [id number] Patient Account Number: [id number] Date of Birth/Sex: Treating RN: 1951-12-14 (72 y.o. [name] Primary Care Chastidy Ranker: Ernestene Mention Other Clinician: Referring Maryclare Nydam: Treating Suhey Radford/Extender: Minerva Ends, [place] BRIELLE Weeks in Treatment: 0 Foot Assessment Items Site Locations + = Sensation present, - = Sensation absent, C = Callus, U = Ulcer R = Redness, W = Warmth, M = Maceration, PU = Pre-ulcerative lesion F = Fissure, S = Swelling, D = Dryness Assessment Right: Left: Other Deformity: No No Prior  Foot Ulcer: No No Prior Amputation: No No Charcot Joint: No No Ambulatory Status: Ambulatory Without Help Gait: Steady Electronic Signature(s) Signed: 05/19/2023 10:30:00 AM By: Brenton Grills Entered By: Brenton Grills on 05/13/2023 07:51:09 -------------------------------------------------------------------------------- Nutrition Risk Screening Details Patient Name: Date of Service: Ascencion Dike, [name] 05/13/2023 7:30 A M Medical Record Number: [id number] Patient Account Number: [id number] Date of Birth/Sex: Treating RN: [birth year] (72 y.o. [name] Primary Care Lettie Czarnecki: Ernestene Mention Other Clinician: Referring Garnette Greb: Treating Andi Layfield/Extender: Minerva Ends, [place] BRIELLE Weeks in Treatment: 0 Height (in): 64 Weight (lbs): 138 Body Mass Index (BMI): 23.7 Buzan, Estephani [name] ([id number]) [id number] Page 4 of 4 Nutrition Risk Screening Items Score Screening NUTRITION RISK SCREEN: I have an illness or condition that made me change the kind and/or amount of food I eat 0 No I eat fewer than two meals per day 0 No I eat few fruits and vegetables, or milk products 0 No I have three or more drinks of beer, liquor or wine almost every day 0 No I have tooth or mouth problems that make it hard for me to eat 0 No I don't always have enough money to buy the food I need 0 No I eat alone most of the time 0 No I take three or more different prescribed or over-the-counter drugs a day 0 No Without wanting to, I have lost or gained 10 pounds in the last six months 0 No I am not always physically able to shop, cook and/or feed myself 0 No Nutrition Protocols Good Risk Protocol Moderate Risk Protocol High Risk Proctocol Risk Level: Good Risk Score: 0 Electronic Signature(s) Signed: 05/19/2023 10:30:00 AM By: Brenton Grills Entered By: Brenton Grills on 05/13/2023 07:48:14

## 2023-05-21 ENCOUNTER — Encounter (HOSPITAL_BASED_OUTPATIENT_CLINIC_OR_DEPARTMENT_OTHER): Payer: 59 | Admitting: General Surgery

## 2023-05-21 DIAGNOSIS — I739 Peripheral vascular disease, unspecified: Secondary | ICD-10-CM | POA: Diagnosis not present

## 2023-05-27 ENCOUNTER — Encounter (HOSPITAL_BASED_OUTPATIENT_CLINIC_OR_DEPARTMENT_OTHER): Payer: 59 | Attending: General Surgery | Admitting: General Surgery

## 2023-05-27 DIAGNOSIS — I872 Venous insufficiency (chronic) (peripheral): Secondary | ICD-10-CM | POA: Diagnosis not present

## 2023-05-27 DIAGNOSIS — M109 Gout, unspecified: Secondary | ICD-10-CM | POA: Insufficient documentation

## 2023-05-27 DIAGNOSIS — Z89611 Acquired absence of right leg above knee: Secondary | ICD-10-CM | POA: Diagnosis not present

## 2023-05-27 DIAGNOSIS — L97822 Non-pressure chronic ulcer of other part of left lower leg with fat layer exposed: Secondary | ICD-10-CM | POA: Diagnosis present

## 2023-05-27 DIAGNOSIS — I739 Peripheral vascular disease, unspecified: Secondary | ICD-10-CM | POA: Diagnosis not present

## 2023-05-27 DIAGNOSIS — M1990 Unspecified osteoarthritis, unspecified site: Secondary | ICD-10-CM | POA: Insufficient documentation

## 2023-05-27 DIAGNOSIS — D45 Polycythemia vera: Secondary | ICD-10-CM | POA: Insufficient documentation

## 2023-05-27 DIAGNOSIS — I10 Essential (primary) hypertension: Secondary | ICD-10-CM | POA: Insufficient documentation

## 2023-05-27 DIAGNOSIS — Z7901 Long term (current) use of anticoagulants: Secondary | ICD-10-CM | POA: Insufficient documentation

## 2023-05-27 DIAGNOSIS — I4891 Unspecified atrial fibrillation: Secondary | ICD-10-CM | POA: Diagnosis not present

## 2023-05-27 DIAGNOSIS — L97821 Non-pressure chronic ulcer of other part of left lower leg limited to breakdown of skin: Secondary | ICD-10-CM | POA: Diagnosis not present

## 2023-06-03 ENCOUNTER — Encounter (HOSPITAL_BASED_OUTPATIENT_CLINIC_OR_DEPARTMENT_OTHER): Payer: 59 | Admitting: General Surgery

## 2023-06-03 DIAGNOSIS — L97822 Non-pressure chronic ulcer of other part of left lower leg with fat layer exposed: Secondary | ICD-10-CM | POA: Diagnosis not present
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
## 2023-06-05 NOTE — Progress Notes (Signed)
[name], GEDDES ([id number]) [id number] Page 1 of 16 Visit Report for 06/03/2023 Arrival Information Details Patient Name: Date of Service: [place], [name] 06/03/2023 11:15 A M Medical Record Number: [id number] Patient Account Number: [id number] Date of Birth/Sex: Treating RN: [birth year] (72 y.o. [name], [name] Primary Care Jedaiah Rathbun: Neale Burly Other Clinician: Referring Abiageal Blowe: Treating Lux Meaders/Extender: Jolyn Lent in Treatment: 3 Visit Information History Since Last Visit Added or deleted any medications: No Patient Arrived: [name] Any new allergies or adverse reactions: No Arrival Time: 11:02 Had a fall or experienced change in No Accompanied By: self activities of daily living that may affect Transfer Assistance: None risk of falls: Patient Identification Verified: Yes Signs or symptoms of abuse/neglect since last visito No Secondary Verification Process Completed: Yes Hospitalized since last visit: No Patient Requires Transmission-Based Precautions: No Implantable device outside of the clinic excluding No Patient Has Alerts: No cellular tissue based products placed in the center since last visit: Has Dressing in Place as Prescribed: Yes Has Compression in Place as Prescribed: Yes Pain Present Now: Yes Electronic Signature(s) Signed: 06/03/2023 4:48:54 PM By: Zenaida Deed RN, [name] Entered By: Zenaida Deed on 06/03/2023 11:06:32 -------------------------------------------------------------------------------- Encounter Discharge Information Details Patient Name: Date of Service: [place], [place] [name] 06/03/2023 11:15 A M Medical Record Number: [id number] Patient Account Number: [id number] Date of Birth/Sex: Treating RN: 1951-01-21 (72 y.o. [name] Standard Primary Care Miquan Tandon: Neale Burly Other Clinician: Referring Oyinkansola Truax: Treating Yoshino Broccoli/Extender: Jolyn Lent  in Treatment: 3 Encounter Discharge Information Items Post Procedure Vitals Discharge Condition: Stable Temperature (F): 98 Ambulatory Status: Walker Pulse (bpm): 78 Discharge Destination: Home Respiratory Rate (breaths/min): 18 Transportation: Other Blood Pressure (mmHg): 199/74 Accompanied By: self Schedule Follow-up Appointment: Yes Clinical Summary of Care: Patient Declined Notes transportation Electronic Signature(s) Signed: 06/03/2023 4:48:54 PM By: Zenaida Deed RN, [name] Entered By: Zenaida Deed on 06/03/2023 11:55:42 [name] ([id number]) [id number] Page 2 of 16 -------------------------------------------------------------------------------- Lower Extremity Assessment Details Patient Name: Date of Service: [place], [name] 06/03/2023 11:15 A M Medical Record Number: [id number] Patient Account Number: [id number] Date of Birth/Sex: Treating RN: 26-Sep-1951 (72 y.o. [name] Standard Primary Care Milliani Herrada: Neale Burly Other Clinician: Referring Vanetta Rule: Treating Nyeema Want/Extender: Jolyn Lent in Treatment: 3 Edema Assessment Assessed: [Left: No] [Right: No] Edema: [Left: N] [Right: o] Calf Left: Right: Point of Measurement: From Medial Instep 30 cm Ankle Left: Right: Point of Measurement: From Medial Instep 18 cm Vascular Assessment Pulses: Dorsalis Pedis Palpable: [Left:No] Electronic Signature(s) Signed: 06/03/2023 4:48:54 PM By: Zenaida Deed RN, [name] Entered By: Zenaida Deed on 06/03/2023 11:10:55 -------------------------------------------------------------------------------- Multi Wound Chart Details Patient Name: Date of Service: [name], [name] 06/03/2023 11:15 A M Medical Record Number: [id number] Patient Account Number: [id number] Date of Birth/Sex: Treating RN: [birth year] (72 y.o. F) Primary Care Lucile Didonato: Neale Burly Other Clinician: Referring Aima Mcwhirt: Treating  Tijana Walder/Extender: Jolyn Lent in Treatment: 3 Vital Signs Height(in): 64 Pulse(bpm): 78 Weight(lbs): 138 Blood Pressure(mmHg): 199/74 Body Mass Index(BMI): 23.7 Temperature(F): 98 Respiratory Rate(breaths/min): 18 [19:Photos:] [[id number] Page 3 of 16] Left, Posterior Lower Leg Left, Anterior Lower Leg Left, Lateral Lower Leg Wound Location: Blister Blister Gradually Appeared Wounding Event: Venous Leg Ulcer Venous Leg Ulcer Venous Leg Ulcer Primary Etiology: Glaucoma, Arrhythmia, Hypertension, Glaucoma, Arrhythmia, Hypertension, Glaucoma, Arrhythmia, Hypertension, Comorbid History: Peripheral Arterial Disease, Peripheral Peripheral Arterial Disease, Peripheral Peripheral Arterial Disease, Peripheral Venous Disease, Gout, Osteoarthritis Venous Disease, Gout, Osteoarthritis Venous Disease, Gout, Osteoarthritis  02/19/2023 02/19/2023 05/21/2023 Date Acquired: 3 3 1  Weeks of Treatment: Open Open Open Wound Status: No No No Wound Recurrence: Yes Yes No Clustered Wound: N/A 2 N/A Clustered Quantity: 1.8x1.4x0.1 2x1.3x0.1 2.1x1.4x0.1 Measurements L x W x D (cm) 1.979 2.042 2.309 A (cm) : rea 0.198 0.204 0.231 Volume (cm) : 92.70% 90.40% -222.90% % Reduction in A rea: 92.70% 90.40% -225.40% % Reduction in Volume: Full Thickness Without Exposed Full Thickness Without Exposed Full Thickness Without Exposed Classification: Support Structures Support Structures Support Structures Medium Small Medium Exudate A mount: Serosanguineous Serosanguineous Purulent Exudate Type: red, brown red, brown yellow, brown, green Exudate Color: Distinct, outline attached Distinct, outline attached Flat and Intact Wound Margin: Medium (34-66%) Large (67-100%) Large (67-100%) Granulation A mount: Red Pink Red Granulation Quality: Medium (34-66%) Small (1-33%) Small (1-33%) Necrotic A mount: Adherent Slough Eschar N/A Necrotic  Tissue: Fat Layer (Subcutaneous Tissue): Yes Fat Layer (Subcutaneous Tissue): Yes Fat Layer (Subcutaneous Tissue): Yes Exposed Structures: Fascia: No Fascia: No Tendon: No Tendon: No Muscle: No Muscle: No Joint: No Joint: No Bone: No Bone: No Small (1-33%) Medium (34-66%) Small (1-33%) Epithelialization: Debridement - Selective/Open Wound N/A Debridement - Selective/Open Wound Debridement: Pre-procedure Verification/Time Out 11:35 N/A 11:35 Taken: Lidocaine 4% Topical Solution N/A Lidocaine 4% Topical Solution Pain Control: Slough N/A Slough Tissue Debrided: Non-Viable Tissue N/A Non-Viable Tissue Level: 1.98 N/A 2.31 Debridement A (sq cm): rea Curette N/A Curette Instrument: Minimum N/A Minimum Bleeding: Pressure N/A Pressure Hemostasis A chieved: 1 N/A 1 Procedural Pain: 0 N/A 0 Post Procedural Pain: Procedure was tolerated well N/A Procedure was tolerated well Debridement Treatment Response: 1.8x1.4x0.1 N/A 2.1x1.4x0.1 Post Debridement Measurements L x W x D (cm) 0.198 N/A 0.231 Post Debridement Volume: (cm) Scarring: Yes Scarring: No No Abnormalities Noted Periwound Skin Texture: Maceration: No Maceration: No No Abnormalities Noted Periwound Skin Moisture: Dry/Scaly: No Dry/Scaly: No Hemosiderin Staining: Yes Hemosiderin Staining: Yes Hemosiderin Staining: Yes Periwound Skin Color: No Abnormality No Abnormality No Abnormality Temperature: Yes Yes Yes Tenderness on Palpation: Debridement N/A Debridement Procedures Performed: Wound Number: [id number]  Photos: Left, Proximal, Lateral Lower Leg Left, Proximal, Posterior Lower Leg Left, Medial Lower Leg Wound Location: Blister Blister Blister Wounding Event: Venous Leg Ulcer Venous Leg Ulcer Venous Leg Ulcer Primary Etiology: Glaucoma, Arrhythmia, Hypertension, Glaucoma, Arrhythmia, Hypertension, Glaucoma, Arrhythmia, Hypertension, Comorbid History: Peripheral Arterial Disease, Peripheral  Peripheral Arterial Disease, Peripheral Peripheral Arterial Disease, Peripheral Venous Disease, Gout, Osteoarthritis Venous Disease, Gout, Osteoarthritis Venous Disease, Gout, Osteoarthritis 05/22/2023 05/22/2023 05/22/2023 Date Acquired: 1 1 1  Weeks of Treatment: Open Open Open Wound Status: No No No Wound Recurrence: No No Yes Clustered Wound: N/A N/A 6 Clustered Quantity: 1.4x0.5x0.1 0.4x0.3x0.1 0.6x0.6x0.1 Measurements L x W x D (cm) 0.55 0.094 0.283 A (cm) : rea 0.055 0.009 0.028 Volume (cm) : 94.20% 83.40% 99.20% % Reduction in AreaTALIJAH, CHESEBRO B ([id number]) [id number] Page 4 of 16 94.20% 84.20% 99.30% % Reduction in Volume: Full Thickness Without Exposed Full Thickness Without Exposed Full Thickness Without Exposed Classification: Support Structures Support Structures Support Structures Medium Small Small Exudate A mount: Serosanguineous Serous Purulent Exudate Type: red, brown amber yellow, brown, green Exudate Color: Distinct, outline attached Flat and Intact Distinct, outline attached Wound Margin: Medium (34-66%) None Present (0%) Large (67-100%) Granulation A mount: Pink N/A Pink, Pale Granulation Quality: Medium (34-66%) Large (67-100%) Small (1-33%) Necrotic A mount: N/A Adherent Slough Adherent Slough Necrotic Tissue: Fat Layer (Subcutaneous Tissue): Yes Fat Layer (Subcutaneous Tissue): Yes Fat Layer (Subcutaneous Tissue): Yes Exposed Structures: Fascia:  No Tendon: No Muscle: No Joint: No Bone: No Small (1-33%) Small (1-33%) Small (1-33%) Epithelialization: Debridement - Selective/Open Wound Debridement - Selective/Open Wound N/A Debridement: Pre-procedure Verification/Time Out 11:35 11:35 N/A Taken: Lidocaine 4% Topical Solution Lidocaine 4% Topical Solution N/A Pain Control: [facility] N/A Tissue Debrided: Non-Viable Tissue Non-Viable Tissue N/A Level: 0.55 0.09 N/A Debridement A (sq cm): rea Curette  Curette N/A Instrument: Minimum Minimum N/A Bleeding: Pressure Pressure N/A Hemostasis A chieved: 1 1 N/A Procedural Pain: 0 0 N/A Post Procedural Pain: Procedure was tolerated well Procedure was tolerated well N/A Debridement Treatment Response: 1.4x0.5x0.1 0.4x0.3x0.1 N/A Post Debridement Measurements L x W x D (cm) 0.055 0.009 N/A Post Debridement Volume: (cm) Scarring: No Scarring: Yes Scarring: Yes Periwound Skin Texture: Maceration: No Maceration: No Maceration: No Periwound Skin Moisture: Dry/Scaly: No Dry/Scaly: No Dry/Scaly: No Hemosiderin Staining: Yes Hemosiderin Staining: Yes Hemosiderin Staining: Yes Periwound Skin Color: No Abnormality No Abnormality No Abnormality Temperature: Yes Yes Yes Tenderness on Palpation: Debridement Debridement N/A Procedures Performed: Treatment Notes Electronic Signature(s) Signed: 06/03/2023 11:44:18 AM By: Duanne Guess MD FACS Entered By: Duanne Guess on 06/03/2023 11:44:18 -------------------------------------------------------------------------------- Multi-Disciplinary Care Plan Details Patient Name: Date of Service: [place], [place] [name] 06/03/2023 11:15 A M Medical Record Number: [id number] Patient Account Number: [id number] Date of Birth/Sex: Treating RN: 1950/12/24 (72 y.o. [name] Standard Primary Care Kitai Purdom: Neale Burly Other Clinician: Referring Matylda Fehring: Treating Verdie Wilms/Extender: Jolyn Lent in Treatment: 3 Multidisciplinary Care Plan reviewed with physician Active Inactive Venous Leg Ulcer Nursing Diagnoses: Knowledge deficit related to disease process and management Potential for venous Insuffiency (use before diagnosis confirmed) Goals: Patient will maintain optimal edema control Date Initiated: 05/21/2023 Target Resolution Date: 06/18/2023 MARQUISIA, FRAVEL ([id number]) [id number] Page 5 of 16 Goal Status:  Active Interventions: Assess peripheral edema status every visit. Compression as ordered Provide education on venous insufficiency Treatment Activities: Therapeutic compression applied : 05/21/2023 Notes: Wound/Skin Impairment Nursing Diagnoses: Impaired tissue integrity Goals: Patient/caregiver will verbalize understanding of skin care regimen Date Initiated: 05/13/2023 Target Resolution Date: 06/20/2023 Goal Status: Active Interventions: Assess patient/caregiver ability to obtain necessary supplies Assess patient/caregiver ability to perform ulcer/skin care regimen upon admission and as needed Assess ulceration(s) every visit Provide education on ulcer and skin care Screen for HBO Treatment Activities: Skin care regimen initiated : 05/13/2023 Topical wound management initiated : 05/13/2023 Notes: Electronic Signature(s) Signed: 06/03/2023 4:48:54 PM By: Zenaida Deed RN, [name] Entered By: Zenaida Deed on 06/03/2023 09:57:50 -------------------------------------------------------------------------------- Pain Assessment Details Patient Name: Date of Service: [name], [name] 06/03/2023 11:15 A M Medical Record Number: [id number] Patient Account Number: [id number] Date of Birth/Sex: Treating RN: Sep 05, 1951 (72 y.o. [name] Standard Primary Care Reonna Finlayson: Neale Burly Other Clinician: Referring Dannah Ryles: Treating Jamia Hoban/Extender: Jolyn Lent in Treatment: 3 Active Problems Location of Pain Severity and Description of Pain Patient Has Paino No Site Locations With Dressing ChangeMARTHENA, HANER ([id number]) [id number] Page 6 of 16 Duration of the Pain. Constant / Intermittento Intermittent Rate the pain. Current Pain Level: 0 Worst Pain Level: 4 Least Pain Level: 0 Character of Pain Describe the Pain: Sharp Pain Management and Medication Current Pain Management: Other: reposition Is the Current Pain  Management Adequate: Adequate How does your wound impact your activities of daily livingo Sleep: No Bathing: No Appetite: No Relationship With Others: No Bladder Continence: No Emotions: No Bowel Continence: No Work: No Toileting: No Drive: No Dressing: No Hobbies: No Electronic Signature(s) Signed: 06/03/2023 4:48:54 PM  By: Zenaida Deed RN, [name] Entered By: Zenaida Deed on 06/03/2023 11:07:38 -------------------------------------------------------------------------------- Patient/Caregiver Education Details Patient Name: Date of Service: [name], [name] 6/13/2024andnbsp11:15 A M Medical Record Number: [id number] Patient Account Number: [id number] Date of Birth/Gender: Treating RN: [birth year] (72 y.o. [name] Standard Primary Care Physician: Neale Burly Other Clinician: Referring Physician: Treating Physician/Extender: Jolyn Lent in Treatment: 3 Education Assessment Education Provided To: Patient Education Topics Provided Wound/Skin Impairment: Methods: Explain/Verbal Responses: Reinforcements needed, State content correctly Electronic Signature(s) Signed: 06/03/2023 4:48:54 PM By: Zenaida Deed RN, [name] Entered By: Zenaida Deed on 06/03/2023 09:58:20 [name] ([id number]) [id number] Page 7 of 16 -------------------------------------------------------------------------------- Wound Assessment Details Patient Name: Date of Service: [place], [name] 06/03/2023 11:15 A M Medical Record Number: [id number] Patient Account Number: [id number] Date of Birth/Sex: Treating RN: [birth year] (72 y.o. [name], Bonita Quin Primary Care Mileena Rothenberger: Neale Burly Other Clinician: Referring Elly Haffey: Treating Shemicka Cohrs/Extender: Jolyn Lent in Treatment: 3 Wound Status Wound Number: 19 Primary Venous Leg Ulcer Etiology: Wound Location: Left, Posterior Lower Leg Wound  Open Wounding Event: Blister Status: Date Acquired: 02/19/2023 Comorbid Glaucoma, Arrhythmia, Hypertension, Peripheral Arterial Disease, Weeks Of Treatment: 3 History: Peripheral Venous Disease, Gout, Osteoarthritis Clustered Wound: Yes Photos Wound Measurements Length: (cm) 1.8 Width: (cm) 1.4 Depth: (cm) 0.1 Area: (cm) 1.979 Volume: (cm) 0.198 % Reduction in Area: 92.7% % Reduction in Volume: 92.7% Epithelialization: Small (1-33%) Tunneling: No Undermining: No Wound Description Classification: Full Thickness Without Exposed Support Structures Wound Margin: Distinct, outline attached Exudate Amount: Medium Exudate Type: Serosanguineous Exudate Color: red, brown Foul Odor After Cleansing: No Slough/Fibrino No Wound Bed Granulation Amount: Medium (34-66%) Exposed Structure Granulation Quality: Red Fat Layer (Subcutaneous Tissue) Exposed: Yes Necrotic Amount: Medium (34-66%) Necrotic Quality: Adherent Slough Periwound Skin Texture Texture Color No Abnormalities Noted: Yes No Abnormalities Noted: No Hemosiderin Staining: Yes Moisture No Abnormalities Noted: No Temperature / Pain Dry / Scaly: No Temperature: No Abnormality Maceration: No Tenderness on Palpation: Yes Treatment Notes Wound #19 (Lower Leg) Wound Laterality: Left, Posterior Cleanser Vashe 5.8 (oz) Discharge Instruction: Cleanse the wound with Vashe prior to applying a clean dressing using gauze sponges, not tissue or cotton balls. GIZZEL, HOFFMEISTER ([id number]) [id number] Page 8 of 16 Peri-Wound Care Triamcinolone 15 (g) Discharge Instruction: Use triamcinolone 15 (g) as directed Sween Lotion (Moisturizing lotion) Discharge Instruction: Apply moisturizing lotion as directed Topical Primary Dressing Maxorb Extra Ag+ Alginate Dressing, 4x4.75 (in/in) Discharge Instruction: Apply to wound bed as instructed Secondary Dressing ABD Pad, 8x10 Discharge Instruction: Apply over  primary dressing as directed. Secured With Compression Wrap Kerlix Roll 4.5x3.1 (in/yd) Discharge Instruction: Apply Kerlix and Coban compression as directed. Coban Self-Adherent Wrap 4x5 (in/yd) Discharge Instruction: Apply over Kerlix as directed. Compression Stockings Add-Ons Electronic Signature(s) Signed: 06/03/2023 4:48:54 PM By: Zenaida Deed RN, [name] Entered By: Zenaida Deed on 06/03/2023 11:26:06 -------------------------------------------------------------------------------- Wound Assessment Details Patient Name: Date of Service: [name], [name] 06/03/2023 11:15 A M Medical Record Number: [id number] Patient Account Number: [id number] Date of Birth/Sex: Treating RN: 1951/09/13 (72 y.o. [name] Standard Primary Care L[place]: Neale Burly Other Clinician: Referring Mykayla Brinton: Treating Flonnie Wierman/Extender: Jolyn Lent in Treatment: 3 Wound Status Wound Number: 20 Primary Venous Leg Ulcer Etiology: Wound Location: Left, Anterior Lower Leg Wound Open Wounding Event: Blister Status: Date Acquired: 02/19/2023 Comorbid Glaucoma, Arrhythmia, Hypertension, Peripheral Arterial Disease, Weeks Of Treatment: 3 History: Peripheral Venous Disease, Gout, Osteoarthritis Clustered Wound: Yes Photos Wound Measurements Length: (cm) 2  Width: (cm) 1.3 Matte, Lashauna B ([id number]) Depth: (cm) 0.1 Clustered Quantity: 2 Area: (cm) 2.042 Volume: (cm) 0.204 % Reduction in Area: 90.4% % Reduction in Volume: 90.4% [id number] Page 9 of 16 Epithelialization: Medium (34-66%) Tunneling: No Undermining: No Wound Description Classification: Full Thickness Without Exposed Support Structures Wound Margin: Distinct, outline attached Exudate Amount: Small Exudate Type: Serosanguineous Exudate Color: red, brown Foul Odor After Cleansing: No Slough/Fibrino No Wound Bed Granulation Amount: Large (67-100%) Exposed  Structure Granulation Quality: Pink Fascia Exposed: No Necrotic Amount: Small (1-33%) Fat Layer (Subcutaneous Tissue) Exposed: Yes Necrotic Quality: Eschar Tendon Exposed: No Muscle Exposed: No Joint Exposed: No Bone Exposed: No Periwound Skin Texture Texture Color No Abnormalities Noted: Yes No Abnormalities Noted: No Hemosiderin Staining: Yes Moisture No Abnormalities Noted: No Temperature / Pain Dry / Scaly: No Temperature: No Abnormality Maceration: No Tenderness on Palpation: Yes Treatment Notes Wound #20 (Lower Leg) Wound Laterality: Left, Anterior Cleanser Vashe 5.8 (oz) Discharge Instruction: Cleanse the wound with Vashe prior to applying a clean dressing using gauze sponges, not tissue or cotton balls. Peri-Wound Care Triamcinolone 15 (g) Discharge Instruction: Use triamcinolone 15 (g) as directed Sween Lotion (Moisturizing lotion) Discharge Instruction: Apply moisturizing lotion as directed Topical Primary Dressing Maxorb Extra Ag+ Alginate Dressing, 4x4.75 (in/in) Discharge Instruction: Apply to wound bed as instructed Secondary Dressing ABD Pad, 8x10 Discharge Instruction: Apply over primary dressing as directed. Secured With Compression Wrap Kerlix Roll 4.5x3.1 (in/yd) Discharge Instruction: Apply Kerlix and Coban compression as directed. Coban Self-Adherent Wrap 4x5 (in/yd) Discharge Instruction: Apply over Kerlix as directed. Compression Stockings Add-Ons Electronic Signature(s) Signed: 06/03/2023 4:48:54 PM By: Zenaida Deed RN, [name] Entered By: Zenaida Deed on 06/03/2023 11:26:50 [name] ([id number]) [id number] Page 10 of 16 -------------------------------------------------------------------------------- Wound Assessment Details Patient Name: Date of Service: [place], [name] 06/03/2023 11:15 A M Medical Record Number: [id number] Patient Account Number: [id number] Date of Birth/Sex: Treating RN: 02-24-1951 (72  y.o. [name], Bonita Quin Primary Care Ajwa Kimberley: Neale Burly Other Clinician: Referring Ofilia Rayon: Treating Jovian Lembcke/Extender: Jolyn Lent in Treatment: 3 Wound Status Wound Number: 21 Primary Venous Leg Ulcer Etiology: Wound Location: Left, Lateral Lower Leg Wound Open Wounding Event: Gradually Appeared Status: Date Acquired: 05/21/2023 Comorbid Glaucoma, Arrhythmia, Hypertension, Peripheral Arterial Disease, Weeks Of Treatment: 1 History: Peripheral Venous Disease, Gout, Osteoarthritis Clustered Wound: No Photos Wound Measurements Length: (cm) 2.1 Width: (cm) 1.4 Depth: (cm) 0.1 Area: (cm) 2.309 Volume: (cm) 0.231 % Reduction in Area: -222.9% % Reduction in Volume: -225.4% Epithelialization: Small (1-33%) Tunneling: No Undermining: No Wound Description Classification: Full Thickness Without Exposed Support Structures Wound Margin: Flat and Intact Exudate Amount: Medium Exudate Type: Purulent Exudate Color: yellow, brown, green Foul Odor After Cleansing: No Slough/Fibrino Yes Wound Bed Granulation Amount: Large (67-100%) Exposed Structure Granulation Quality: Red Fascia Exposed: No Necrotic Amount: Small (1-33%) Fat Layer (Subcutaneous Tissue) Exposed: Yes Tendon Exposed: No Muscle Exposed: No Joint Exposed: No Bone Exposed: No Periwound Skin Texture Texture Color No Abnormalities Noted: Yes No Abnormalities Noted: No Hemosiderin Staining: Yes Moisture No Abnormalities Noted: Yes Temperature / Pain Temperature: No Abnormality Tenderness on Palpation: Yes Treatment Notes Wound #21 (Lower Leg) Wound Laterality: Left, Lateral Cleanser Vashe 5.8 (oz) Discharge Instruction: Cleanse the wound with Vashe prior to applying a clean dressing using gauze sponges, not tissue or cotton balls. KEILA, MANGINELLI ([id number]) [id number] Page 11 of 16 Peri-Wound Care Triamcinolone 15 (g) Discharge Instruction: Use  triamcinolone 15 (g) as directed Sween Lotion (Moisturizing lotion) Discharge Instruction: Apply  moisturizing lotion as directed Topical Primary Dressing Maxorb Extra Ag+ Alginate Dressing, 4x4.75 (in/in) Discharge Instruction: Apply to wound bed as instructed Secondary Dressing ABD Pad, 8x10 Discharge Instruction: Apply over primary dressing as directed. Secured With Compression Wrap Kerlix Roll 4.5x3.1 (in/yd) Discharge Instruction: Apply Kerlix and Coban compression as directed. Coban Self-Adherent Wrap 4x5 (in/yd) Discharge Instruction: Apply over Kerlix as directed. Compression Stockings Add-Ons Electronic Signature(s) Signed: 06/03/2023 4:48:54 PM By: Zenaida Deed RN, [name] Entered By: Zenaida Deed on 06/03/2023 11:40:33 -------------------------------------------------------------------------------- Wound Assessment Details Patient Name: Date of Service: [name], [name] 06/03/2023 11:15 A M Medical Record Number: [id number] Patient Account Number: [id number] Date of Birth/Sex: Treating RN: Feb 11, 1951 (72 y.o. [name] Standard Primary Care Courteney Alderete: Neale Burly Other Clinician: Referring Rosalynn Sergent: Treating Louine Tenpenny/Extender: Jolyn Lent in Treatment: 3 Wound Status Wound Number: 22 Primary Venous Leg Ulcer Etiology: Wound Location: Left, Proximal, Lateral Lower Leg Wound Open Wounding Event: Blister Status: Date Acquired: 05/22/2023 Comorbid Glaucoma, Arrhythmia, Hypertension, Peripheral Arterial Disease, Weeks Of Treatment: 1 History: Peripheral Venous Disease, Gout, Osteoarthritis Clustered Wound: No Photos Wound Measurements Length: (cm) 1.4 Width: (cm) 0.5 Geathers, Ericka B ([id number]) Depth: (cm) 0.1 Area: (cm) 0.55 Volume: (cm) 0.055 % Reduction in Area: 94.2% % Reduction in Volume: 94.2% [id number] Page 12 of 16 Epithelialization: Small (1-33%) Tunneling: No Undermining: No Wound  Description Classification: Full Thickness Without Exposed Support Structures Wound Margin: Distinct, outline attached Exudate Amount: Medium Exudate Type: Serosanguineous Exudate Color: red, brown Foul Odor After Cleansing: No Slough/Fibrino Yes Wound Bed Granulation Amount: Medium (34-66%) Exposed Structure Granulation Quality: Pink Fat Layer (Subcutaneous Tissue) Exposed: Yes Necrotic Amount: Medium (34-66%) Periwound Skin Texture Texture Color No Abnormalities Noted: Yes No Abnormalities Noted: No Hemosiderin Staining: Yes Moisture No Abnormalities Noted: No Temperature / Pain Dry / Scaly: No Temperature: No Abnormality Maceration: No Tenderness on Palpation: Yes Treatment Notes Wound #22 (Lower Leg) Wound Laterality: Left, Lateral, Proximal Cleanser Vashe 5.8 (oz) Discharge Instruction: Cleanse the wound with Vashe prior to applying a clean dressing using gauze sponges, not tissue or cotton balls. Peri-Wound Care Triamcinolone 15 (g) Discharge Instruction: Use triamcinolone 15 (g) as directed Sween Lotion (Moisturizing lotion) Discharge Instruction: Apply moisturizing lotion as directed Topical Primary Dressing Maxorb Extra Ag+ Alginate Dressing, 4x4.75 (in/in) Discharge Instruction: Apply to wound bed as instructed Secondary Dressing ABD Pad, 8x10 Discharge Instruction: Apply over primary dressing as directed. Secured With Compression Wrap Kerlix Roll 4.5x3.1 (in/yd) Discharge Instruction: Apply Kerlix and Coban compression as directed. Coban Self-Adherent Wrap 4x5 (in/yd) Discharge Instruction: Apply over Kerlix as directed. Compression Stockings Add-Ons Electronic Signature(s) Signed: 06/03/2023 4:48:54 PM By: Zenaida Deed RN, [name] Entered By: Zenaida Deed on 06/03/2023 11:40:33 Wound Assessment Details -------------------------------------------------------------------------------- [name] ([id number]) [id number]  Page 13 of 16 Patient Name: Date of Service: [place], [name] 06/03/2023 11:15 A M Medical Record Number: [id number] Patient Account Number: [id number] Date of Birth/Sex: Treating RN: October 21, 1951 (72 y.o. [name] Standard Primary Care Dennys Traughber: Neale Burly Other Clinician: Referring Nellie Pester: Treating Lenix Kidd/Extender: Jolyn Lent in Treatment: 3 Wound Status Wound Number: 23 Primary Venous Leg Ulcer Etiology: Wound Location: Left, Proximal, Posterior Lower Leg Wound Open Wounding Event: Blister Status: Date Acquired: 05/22/2023 Comorbid Glaucoma, Arrhythmia, Hypertension, Peripheral Arterial Disease, Weeks Of Treatment: 1 History: Peripheral Venous Disease, Gout, Osteoarthritis Clustered Wound: No Photos Wound Measurements Length: (cm) 0.4 % Reduction in Area: 83.4% Width: (cm) 0.3 % Reduction in Volume: 84.2% Depth: (cm) 0.1 Epithelialization: Small (1-33%) Area: (  cm) 0.094 Tunneling: No Volume: (cm) 0.009 Undermining: No Wound Description Classification: Full Thickness Without Exposed Support Structures Foul Odor After Cleansing: No Wound Margin: Flat and Intact Slough/Fibrino Yes Exudate Amount: Small Exudate Type: Serous Exudate Color: amber Wound Bed Granulation Amount: None Present (0%) Exposed Structure Necrotic Amount: Large (67-100%) Fascia Exposed: No Necrotic Quality: Adherent Slough Fat Layer (Subcutaneous Tissue) Exposed: Yes Tendon Exposed: No Muscle Exposed: No Joint Exposed: No Bone Exposed: No Periwound Skin Texture Texture Color No Abnormalities Noted: No No Abnormalities Noted: No Scarring: Yes Hemosiderin Staining: Yes Moisture Temperature / Pain No Abnormalities Noted: No Temperature: No Abnormality Dry / Scaly: No Tenderness on Palpation: Yes Maceration: No Treatment Notes Wound #23 (Lower Leg) Wound Laterality: Left, Posterior, Proximal Cleanser Vashe 5.8 (oz) Discharge Instruction: Cleanse  the wound with Vashe prior to applying a clean dressing using gauze sponges, not tissue or cotton balls. Peri-Wound Care Triamcinolone 15 (g) Discharge Instruction: Use triamcinolone 15 (g) as directed ROSHAUNDA, CHOMA B ([id number]) [id number] Page 14 of 16 Sween Lotion (Moisturizing lotion) Discharge Instruction: Apply moisturizing lotion as directed Topical Primary Dressing Maxorb Extra Ag+ Alginate Dressing, 4x4.75 (in/in) Discharge Instruction: Apply to wound bed as instructed Secondary Dressing ABD Pad, 8x10 Discharge Instruction: Apply over primary dressing as directed. Secured With Compression Wrap Kerlix Roll 4.5x3.1 (in/yd) Discharge Instruction: Apply Kerlix and Coban compression as directed. Coban Self-Adherent Wrap 4x5 (in/yd) Discharge Instruction: Apply over Kerlix as directed. Compression Stockings Add-Ons Electronic Signature(s) Signed: 06/03/2023 4:48:54 PM By: Zenaida Deed RN, [name] Entered By: Zenaida Deed on 06/03/2023 11:29:02 -------------------------------------------------------------------------------- Wound Assessment Details Patient Name: Date of Service: [name], [name] 06/03/2023 11:15 A M Medical Record Number: [id number] Patient Account Number: [id number] Date of Birth/Sex: Treating RN: [birth year] (72 y.o. [name] Standard Primary Care Cumi Sanagustin: Neale Burly Other Clinician: Referring Carlisha Wisler: Treating Farooq Petrovich/Extender: Jolyn Lent in Treatment: 3 Wound Status Wound Number: 24 Primary Venous Leg Ulcer Etiology: Wound Location: Left, Medial Lower Leg Wound Open Wounding Event: Blister Status: Date Acquired: 05/22/2023 Comorbid Glaucoma, Arrhythmia, Hypertension, Peripheral Arterial Disease, Weeks Of Treatment: 1 History: Peripheral Venous Disease, Gout, Osteoarthritis Clustered Wound: Yes Photos Wound Measurements Length: (cm) 0.6 Width: (cm) 0.6 Depth: (cm) 0.1 Clustered  Quantity: 6 Area: (cm) 0.283 Volume: (cm) 0.028 Severa, Kathia B ([id number]) % Reduction in Area: 99.2% % Reduction in Volume: 99.3% Epithelialization: Small (1-33%) Tunneling: No Undermining: No [id number] Page 15 of 16 Wound Description Classification: Full Thickness Without Exposed Support Structures Wound Margin: Distinct, outline attached Exudate Amount: Small Exudate Type: Purulent Exudate Color: yellow, brown, green Foul Odor After Cleansing: No Slough/Fibrino Yes Wound Bed Granulation Amount: Large (67-100%) Exposed Structure Granulation Quality: Pink, Pale Fat Layer (Subcutaneous Tissue) Exposed: Yes Necrotic Amount: Small (1-33%) Necrotic Quality: Adherent Slough Periwound Skin Texture Texture Color No Abnormalities Noted: No No Abnormalities Noted: No Scarring: Yes Hemosiderin Staining: Yes Moisture Temperature / Pain No Abnormalities Noted: No Temperature: No Abnormality Dry / Scaly: No Tenderness on Palpation: Yes Maceration: No Treatment Notes Wound #24 (Lower Leg) Wound Laterality: Left, Medial Cleanser Vashe 5.8 (oz) Discharge Instruction: Cleanse the wound with Vashe prior to applying a clean dressing using gauze sponges, not tissue or cotton balls. Peri-Wound Care Triamcinolone 15 (g) Discharge Instruction: Use triamcinolone 15 (g) as directed Sween Lotion (Moisturizing lotion) Discharge Instruction: Apply moisturizing lotion as directed Topical Primary Dressing Maxorb Extra Ag+ Alginate Dressing, 4x4.75 (in/in) Discharge Instruction: Apply to wound bed as instructed Secondary Dressing ABD Pad, 8x10 Discharge Instruction: Apply over primary  dressing as directed. Secured With Compression Wrap Kerlix Roll 4.5x3.1 (in/yd) Discharge Instruction: Apply Kerlix and Coban compression as directed. Coban Self-Adherent Wrap 4x5 (in/yd) Discharge Instruction: Apply over Kerlix as directed. Compression  Stockings Add-Ons Electronic Signature(s) Signed: 06/03/2023 4:48:54 PM By: Zenaida Deed RN, [name] Entered By: Zenaida Deed on 06/03/2023 11:29:42 -------------------------------------------------------------------------------- Vitals Details Patient Name: Date of Service: [name], [name] 06/03/2023 11:15 A Osborne Oman, Carlynn Spry ([id number]) [id number] Page 16 of 16 Medical Record Number: [id number] Patient Account Number: [id number] Date of Birth/Sex: Treating RN: 1951/04/25 (72 y.o. [name] Standard Primary Care Linet Brash: Neale Burly Other Clinician: Referring Ehsan Corvin: Treating Tameia Rafferty/Extender: Jolyn Lent in Treatment: 3 Vital Signs Time Taken: 11:06 Temperature (F): 98 Height (in): 64 Pulse (bpm): 78 Weight (lbs): 138 Respiratory Rate (breaths/min): 18 Body Mass Index (BMI): 23.7 Blood Pressure (mmHg): 199/74 Reference Range: 80 - 120 mg / dl Electronic Signature(s) Signed: 06/03/2023 4:48:54 PM By: Zenaida Deed RN, [name] Entered By: Zenaida Deed on 06/03/2023 11:06:58

## 2023-06-09 ENCOUNTER — Other Ambulatory Visit: Payer: Self-pay | Admitting: Family Medicine

## 2023-06-09 DIAGNOSIS — Z8673 Personal history of transient ischemic attack (TIA), and cerebral infarction without residual deficits: Secondary | ICD-10-CM

## 2023-06-09 DIAGNOSIS — E782 Mixed hyperlipidemia: Secondary | ICD-10-CM

## 2023-06-09 DIAGNOSIS — I10 Essential (primary) hypertension: Secondary | ICD-10-CM

## 2023-06-09 DIAGNOSIS — I214 Non-ST elevation (NSTEMI) myocardial infarction: Secondary | ICD-10-CM

## 2023-06-09 DIAGNOSIS — M1712 Unilateral primary osteoarthritis, left knee: Secondary | ICD-10-CM

## 2023-06-09 DIAGNOSIS — I482 Chronic atrial fibrillation, unspecified: Secondary | ICD-10-CM

## 2023-06-10 ENCOUNTER — Encounter (HOSPITAL_BASED_OUTPATIENT_CLINIC_OR_DEPARTMENT_OTHER): Payer: 59 | Admitting: General Surgery

## 2023-06-10 DIAGNOSIS — L97822 Non-pressure chronic ulcer of other part of left lower leg with fat layer exposed: Secondary | ICD-10-CM | POA: Diagnosis not present
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
## 2023-06-10 NOTE — Progress Notes (Signed)
JAIANA, WAAGE ([id number]) [id number] Page 1 of 16 Visit Report for 06/10/2023 Arrival Information Details Patient Name: Date of Service: [place], [name] 06/10/2023 11:15 A M Medical Record Number: [id number] Patient Account Number: [id number] Date of Birth/Sex: Treating RN: 04-11-1951 (72 y.o. F) Primary Care Stephen Turnbaugh: Neale Burly Other Clinician: Referring Suhailah Kwan: Treating Bladen Umar/Extender: Jolyn Lent in Treatment: 4 Visit Information History Since Last Visit All ordered tests and consults were completed: No Patient Arrived: [name] Added or deleted any medications: No Arrival Time: 10:44 Any new allergies or adverse reactions: No Accompanied By: self Had a fall or experienced change in No Transfer Assistance: None activities of daily living that may affect Patient Identification Verified: Yes risk of falls: Secondary Verification Process Completed: Yes Signs or symptoms of abuse/neglect since last visito No Patient Requires Transmission-Based Precautions: No Hospitalized since last visit: No Patient Has Alerts: No Implantable device outside of the clinic excluding No cellular tissue based products placed in the center since last visit: Has Dressing in Place as Prescribed: Yes Has Compression in Place as Prescribed: Yes Pain Present Now: No Electronic Signature(s) Signed: 06/10/2023 4:29:08 PM By: Zenaida Deed RN, [name] Entered By: Zenaida Deed on 06/10/2023 11:08:38 -------------------------------------------------------------------------------- Encounter Discharge Information Details Patient Name: Date of Service: [place], [place] [name] 06/10/2023 11:15 A M Medical Record Number: [id number] Patient Account Number: [id number] Date of Birth/Sex: Treating RN: 06-10-1951 (72 y.o. Tommye Standard Primary Care Kenya Shiraishi: Neale Burly Other Clinician: Referring Ryatt Corsino: Treating Caterina Racine/Extender: Jolyn Lent in Treatment: 4 Encounter Discharge Information Items Post Procedure Vitals Discharge Condition: Stable Temperature (F): 98.6 Ambulatory Status: Walker Pulse (bpm): 73 Discharge Destination: Home Respiratory Rate (breaths/min): 18 Transportation: Other Blood Pressure (mmHg): 194/70 Accompanied By: self Schedule Follow-up Appointment: Yes Clinical Summary of Care: Patient Declined Notes transportation service Electronic Signature(s) Signed: 06/10/2023 4:29:08 PM By: Zenaida Deed RN, [name] Entered By: Zenaida Deed on 06/10/2023 11:43:21 [name] ([id number]) [id number] Page 2 of 16 -------------------------------------------------------------------------------- Lower Extremity Assessment Details Patient Name: Date of Service: [place], [name] 06/10/2023 11:15 A M Medical Record Number: [id number] Patient Account Number: [id number] Date of Birth/Sex: Treating RN: Oct 19, 1951 (72 y.o. Tommye Standard Primary Care Liel Rudden: Neale Burly Other Clinician: Referring Destanee Bedonie: Treating Maguadalupe Lata/Extender: Jolyn Lent in Treatment: 4 Edema Assessment Assessed: [Left: No] [Right: No] Edema: [Left: N] [Right: o] Calf Left: Right: Point of Measurement: From Medial Instep 29 cm Ankle Left: Right: Point of Measurement: From Medial Instep 18 cm Vascular Assessment Pulses: Dorsalis Pedis Palpable: [Left:No] Electronic Signature(s) Signed: 06/10/2023 4:29:08 PM By: Zenaida Deed RN, [name] Entered By: Zenaida Deed on 06/10/2023 11:09:28 -------------------------------------------------------------------------------- Multi Wound Chart Details Patient Name: Date of Service: [name], [name] 06/10/2023 11:15 A M Medical Record Number: [id number] Patient Account Number: [id number] Date of Birth/Sex: Treating RN: 1951-01-24 (72 y.o. F) Primary Care Alonso Gapinski: Neale Burly Other  Clinician: Referring Kelyn Ponciano: Treating Mattison Stuckey/Extender: Jolyn Lent in Treatment: 4 Vital Signs Height(in): 64 Pulse(bpm): 73 Weight(lbs): 138 Blood Pressure(mmHg): 194/70 Body Mass Index(BMI): 23.7 Temperature(F): 98.6 Respiratory Rate(breaths/min): 20 [19:Photos:] [[id number] Page 3 of 16] Left, Posterior Lower Leg Left, Anterior Lower Leg Left, Lateral Lower Leg Wound Location: Blister Blister Gradually Appeared Wounding Event: Venous Leg Ulcer Venous Leg Ulcer Venous Leg Ulcer Primary Etiology: Glaucoma, Arrhythmia, Hypertension, Glaucoma, Arrhythmia, Hypertension, Glaucoma, Arrhythmia, Hypertension, Comorbid History: Peripheral Arterial Disease, Peripheral Peripheral Arterial Disease, Peripheral Peripheral Arterial Disease, Peripheral Venous Disease, Gout, Osteoarthritis Venous  Disease, Gout, Osteoarthritis Venous Disease, Gout, Osteoarthritis 02/19/2023 02/19/2023 05/21/2023 Date Acquired: 4 4 2  Weeks of Treatment: Open Healed - Epithelialized Open Wound Status: No No No Wound Recurrence: Yes Yes No Clustered Wound: N/A 2 N/A Clustered Quantity: 2.2x2x0.1 0x0x0 1.8x1.3x0.1 Measurements L x W x D (cm) 3.456 0 1.838 A (cm) : rea 0.346 0 0.184 Volume (cm) : 87.20% 100.00% -157.10% % Reduction in A rea: 87.20% 100.00% -159.20% % Reduction in Volume: Full Thickness Without Exposed Full Thickness Without Exposed Full Thickness Without Exposed Classification: Support Structures Support Structures Support Structures Medium Small Medium Exudate A mount: Serosanguineous Serosanguineous Serosanguineous Exudate Type: red, brown red, brown red, brown Exudate Color: Distinct, outline attached Distinct, outline attached Flat and Intact Wound Margin: Medium (34-66%) Large (67-100%) Large (67-100%) Granulation A mount: Pink Pink Red Granulation Quality: Medium (34-66%) Small (1-33%) Small (1-33%) Necrotic A  mount: Adherent Slough Eschar Adherent Slough Necrotic Tissue: Fat Layer (Subcutaneous Tissue): Yes Fat Layer (Subcutaneous Tissue): Yes Fat Layer (Subcutaneous Tissue): Yes Exposed Structures: Fascia: No Fascia: No Tendon: No Tendon: No Muscle: No Muscle: No Joint: No Joint: No Bone: No Bone: No Small (1-33%) Medium (34-66%) Small (1-33%) Epithelialization: Debridement - Selective/Open Wound N/A Debridement - Selective/Open Wound Debridement: Pre-procedure Verification/Time Out 11:25 N/A 11:25 Taken: Lidocaine 4% Topical Solution N/A Lidocaine 4% Topical Solution Pain Control: Slough N/A Slough Tissue Debrided: Non-Viable Tissue N/A Non-Viable Tissue Level: 3.45 N/A 1.84 Debridement A (sq cm): rea Curette N/A Curette Instrument: Minimum N/A Minimum Bleeding: Pressure N/A Pressure Hemostasis A chieved: 4 N/A 4 Procedural Pain: 3 N/A 3 Post Procedural Pain: Procedure was tolerated well N/A Procedure was tolerated well Debridement Treatment Response: 2.2x2x0.1 N/A 1.8x1.3x0.1 Post Debridement Measurements L x W x D (cm) 0.346 N/A 0.184 Post Debridement Volume: (cm) Scarring: Yes Scarring: No No Abnormalities Noted Periwound Skin Texture: Maceration: No Maceration: No No Abnormalities Noted Periwound Skin Moisture: Dry/Scaly: No Dry/Scaly: No Hemosiderin Staining: Yes Hemosiderin Staining: Yes Hemosiderin Staining: Yes Periwound Skin Color: No Abnormality No Abnormality No Abnormality Temperature: Yes Yes Yes Tenderness on Palpation: N/A N/A Debridement Procedures Performed: Wound Number: [id number]  Photos: No Photos Left, Proximal, Lateral Lower Leg Left, Proximal, Posterior Lower Leg Left, Proximal, Posterior Lower Leg Wound Location: Blister Blister Blister Wounding Event: Venous Leg Ulcer Venous Leg Ulcer Venous Leg Ulcer Primary Etiology: Glaucoma, Arrhythmia, Hypertension, Glaucoma, Arrhythmia, Hypertension, Glaucoma, Arrhythmia,  Hypertension, Comorbid History: Peripheral Arterial Disease, Peripheral Peripheral Arterial Disease, Peripheral Peripheral Arterial Disease, Peripheral Venous Disease, Gout, Osteoarthritis Venous Disease, Gout, Osteoarthritis Venous Disease, Gout, Osteoarthritis 05/22/2023 05/22/2023 05/22/2023 Date Acquired: 2 2 2  Weeks of Treatment: Open Open Open Wound Status: No No No Wound Recurrence: No No No Clustered Wound: N/A N/A N/A Clustered Quantity: 1.2x0.6x0.1 0.1x0.1x0.1 0.1x0.1x0.1 Measurements L x W x D (cm) 0.565 0.008 0.008 A (cm) : rea 0.057 0.001 0.001 Volume (cm) YASMIN, SHARAF B ([id number]) [id number] Page 4 of 16 94.00% 98.60% 98.60% % Reduction in Area: 93.90% 98.20% 98.20% % Reduction in Volume: Full Thickness Without Exposed Full Thickness Without Exposed Full Thickness Without Exposed Classification: Support Structures Support Structures Support Structures Medium Small Small Exudate A mount: Serosanguineous Serous Serous Exudate Type: red, brown amber amber Exudate Color: Distinct, outline attached Flat and Intact N/A Wound Margin: None Present (0%) None Present (0%) N/A Granulation A mount: N/A N/A N/A Granulation Quality: Large (67-100%) Large (67-100%) N/A Necrotic A mount: Adherent Slough Adherent Slough N/A Necrotic Tissue: Fat Layer (Subcutaneous Tissue): Yes Fat Layer (Subcutaneous Tissue): Yes N/A Exposed  Structures: Fascia: No Tendon: No Muscle: No Joint: No Bone: No None Large (67-100%) N/A Epithelialization: Debridement - Selective/Open Wound Debridement - Selective/Open Wound Debridement - Selective/Open Wound Debridement: Pre-procedure Verification/Time Out 11:25 11:25 11:25 Taken: Lidocaine 4% Topical Solution Lidocaine 4% Topical Solution Lidocaine 4% Topical Solution Pain Control: [facility] Tissue Debrided: Non-Viable Tissue Non-Viable Tissue Non-Viable Tissue Level: 0.57 0.01  0.01 Debridement A (sq cm): rea Curette Curette Curette Instrument: Minimum Minimum Minimum Bleeding: Pressure Pressure Pressure Hemostasis A chieved: 4 4 4  Procedural Pain: 3 3 3  Post Procedural Pain: Procedure was tolerated well Procedure was tolerated well Procedure was tolerated well Debridement Treatment Response: 1.2x0.6x0.1 0.1x0.1x0.1 0.1x0.1x0.1 Post Debridement Measurements L x W x D (cm) 0.057 0.001 0.001 Post Debridement Volume: (cm) Scarring: No Scarring: No Periwound Skin Texture: Maceration: No Maceration: No Periwound Skin Moisture: Dry/Scaly: No Dry/Scaly: No Hemosiderin Staining: Yes Hemosiderin Staining: Yes Periwound Skin Color: No Abnormality No Abnormality N/A Temperature: Yes Yes N/A Tenderness on Palpation: Debridement Debridement Debridement Procedures Performed: Wound Number: 24 N/A N/A Photos: N/A N/A Left, Medial Lower Leg N/A N/A Wound Location: Blister N/A N/A Wounding Event: Venous Leg Ulcer N/A N/A Primary Etiology: Glaucoma, Arrhythmia, Hypertension, N/A N/A Comorbid History: Peripheral Arterial Disease, Peripheral Venous Disease, Gout, Osteoarthritis 05/22/2023 N/A N/A Date Acquired: 2 N/A N/A Weeks of Treatment: Healed - Epithelialized N/A N/A Wound Status: No N/A N/A Wound Recurrence: Yes N/A N/A Clustered Wound: 6 N/A N/A Clustered Quantity: 0x0x0 N/A N/A Measurements L x W x D (cm) 0 N/A N/A A (cm) : rea 0 N/A N/A Volume (cm) : 100.00% N/A N/A % Reduction in Area: 100.00% N/A N/A % Reduction in Volume: Full Thickness Without Exposed N/A N/A Classification: Support Structures None Present N/A N/A Exudate A mount: N/A N/A N/A Exudate Type: N/A N/A N/A Exudate Color: N/A N/A N/A Wound Margin: Small (1-33%) N/A N/A Granulation A mount: Pink N/A N/A Granulation Quality: None Present (0%) N/A N/A Necrotic A mount: N/A N/A N/A Necrotic Tissue: Fat Layer (Subcutaneous Tissue): Yes N/A N/A Exposed  Structures: Large (67-100%) N/A N/A Epithelialization: N/A N/A N/A Debridement: N/A N/A N/A Pain ControlKANIKA, MALLO B ([id number]) [id number] Page 5 of 16 N/A N/A N/A Tissue Debrided: N/A N/A N/A Level: N/A N/A N/A Debridement A (sq cm): rea N/A N/A N/A Instrument: N/A N/A N/A Bleeding: N/A N/A N/A Hemostasis A chieved: N/A N/A N/A Procedural Pain: N/A N/A N/A Post Procedural Pain: N/A N/A N/A Debridement Treatment Response: N/A N/A N/A Post Debridement Measurements L x W x D (cm) N/A N/A N/A Post Debridement Volume: (cm) Scarring: Yes N/A N/A Periwound Skin Texture: Maceration: No N/A N/A Periwound Skin Moisture: Dry/Scaly: No Hemosiderin Staining: Yes N/A N/A Periwound Skin Color: No Abnormality N/A N/A Temperature: Yes N/A N/A Tenderness on Palpation: N/A N/A N/A Procedures Performed: Treatment Notes Electronic Signature(s) Signed: 06/10/2023 11:28:27 AM By: Duanne Guess MD FACS Entered By: Duanne Guess on 06/10/2023 11:28:27 -------------------------------------------------------------------------------- Multi-Disciplinary Care Plan Details Patient Name: Date of Service: [place], [name] 06/10/2023 11:15 A M Medical Record Number: [id number] Patient Account Number: [id number] Date of Birth/Sex: Treating RN: 08/07/1951 (72 y.o. Tommye Standard Primary Care Finnley Larusso: Neale Burly Other Clinician: Referring Jamila Slatten: Treating Prashant Glosser/Extender: Jolyn Lent in Treatment: 4 Multidisciplinary Care Plan reviewed with physician Active Inactive Venous Leg Ulcer Nursing Diagnoses: Knowledge deficit related to disease process and management Potential for venous Insuffiency (use before diagnosis confirmed) Goals: Patient will maintain optimal edema control Date Initiated: 05/21/2023 Target  Resolution Date: 06/18/2023 Goal Status: Active Interventions: Assess peripheral edema status  every visit. Compression as ordered Provide education on venous insufficiency Treatment Activities: Therapeutic compression applied : 05/21/2023 Notes: Wound/Skin Impairment Nursing Diagnoses: Impaired tissue integrity Goals: Patient/caregiver will verbalize understanding of skin care regimen Date Initiated: 05/13/2023 Target Resolution Date: 06/20/2023 KEAIRRA, ARTALE ([id number]) [id number] Page 6 of 16 Goal Status: Active Interventions: Assess patient/caregiver ability to obtain necessary supplies Assess patient/caregiver ability to perform ulcer/skin care regimen upon admission and as needed Assess ulceration(s) every visit Provide education on ulcer and skin care Screen for HBO Treatment Activities: Skin care regimen initiated : 05/13/2023 Topical wound management initiated : 05/13/2023 Notes: Electronic Signature(s) Signed: 06/10/2023 4:29:08 PM By: Zenaida Deed RN, [name] Entered By: Zenaida Deed on 06/10/2023 11:15:49 -------------------------------------------------------------------------------- Pain Assessment Details Patient Name: Date of Service: [name], [name] 06/10/2023 11:15 A M Medical Record Number: [id number] Patient Account Number: [id number] Date of Birth/Sex: Treating RN: 1951/11/27 (72 y.o. F) Primary Care Dimitriy Carreras: Neale Burly Other Clinician: Referring Christan Ciccarelli: Treating Jaila Schellhorn/Extender: Jolyn Lent in Treatment: 4 Active Problems Location of Pain Severity and Description of Pain Patient Has Paino No Site Locations Rate the pain. Current Pain Level: 0 Pain Management and Medication Current Pain Management: Electronic Signature(s) Signed: 06/10/2023 4:29:08 PM By: Zenaida Deed RN, [name] Entered By: Zenaida Deed on 06/10/2023 11:08:59 [name] ([id number]) [id number] Page 7 of  16 -------------------------------------------------------------------------------- Patient/Caregiver Education Details Patient Name: Date of Service: [place], [name] 6/20/2024andnbsp11:15 A M Medical Record Number: [id number] Patient Account Number: [id number] Date of Birth/Gender: Treating RN: 1951-09-09 (72 y.o. Tommye Standard Primary Care Physician: Neale Burly Other Clinician: Referring Physician: Treating Physician/Extender: Jolyn Lent in Treatment: 4 Education Assessment Education Provided To: Patient Education Topics Provided Venous: Methods: Explain/Verbal Responses: Reinforcements needed, State content correctly Wound/Skin Impairment: Methods: Explain/Verbal Responses: Reinforcements needed, State content correctly Electronic Signature(s) Signed: 06/10/2023 4:29:08 PM By: Zenaida Deed RN, [name] Entered By: Zenaida Deed on 06/10/2023 11:16:33 -------------------------------------------------------------------------------- Wound Assessment Details Patient Name: Date of Service: [name], [name] 06/10/2023 11:15 A M Medical Record Number: [id number] Patient Account Number: [id number] Date of Birth/Sex: Treating RN: 1951/07/03 (72 y.o. F) Primary Care Davy Westmoreland: Neale Burly Other Clinician: Referring Ridley Dileo: Treating Catie Chiao/Extender: Jolyn Lent in Treatment: 4 Wound Status Wound Number: 19 Primary Venous Leg Ulcer Etiology: Wound Location: Left, Posterior Lower Leg Wound Open Wounding Event: Blister Status: Date Acquired: 02/19/2023 Comorbid Glaucoma, Arrhythmia, Hypertension, Peripheral Arterial Disease, Weeks Of Treatment: 4 History: Peripheral Venous Disease, Gout, Osteoarthritis Clustered Wound: Yes Photos ANELIE, BOWDISH ([id number]) [id number] Page 8 of 16 Wound Measurements Length: (cm) 2.2 Width: (cm) 2 Depth: (cm) 0.1 Area: (cm) 3.456 Volume:  (cm) 0.346 % Reduction in Area: 87.2% % Reduction in Volume: 87.2% Epithelialization: Small (1-33%) Tunneling: No Undermining: No Wound Description Classification: Full Thickness Without Exposed Support Structures Wound Margin: Distinct, outline attached Exudate Amount: Medium Exudate Type: Serosanguineous Exudate Color: red, brown Foul Odor After Cleansing: No Slough/Fibrino No Wound Bed Granulation Amount: Medium (34-66%) Exposed Structure Granulation Quality: Pink Fat Layer (Subcutaneous Tissue) Exposed: Yes Necrotic Amount: Medium (34-66%) Necrotic Quality: Adherent Slough Periwound Skin Texture Texture Color No Abnormalities Noted: Yes No Abnormalities Noted: No Hemosiderin Staining: Yes Moisture No Abnormalities Noted: No Temperature / Pain Dry / Scaly: No Temperature: No Abnormality Maceration: No Tenderness on Palpation: Yes Treatment Notes Wound #19 (Lower Leg) Wound Laterality: Left, Posterior Cleanser Vashe 5.8 (oz) Discharge Instruction: Cleanse  the wound with Vashe prior to applying a clean dressing using gauze sponges, not tissue or cotton balls. Peri-Wound Care Triamcinolone 15 (g) Discharge Instruction: Use triamcinolone 15 (g) as directed Sween Lotion (Moisturizing lotion) Discharge Instruction: Apply moisturizing lotion as directed Topical Primary Dressing Maxorb Extra Ag+ Alginate Dressing, 4x4.75 (in/in) Discharge Instruction: Apply to wound bed as instructed Secondary Dressing ABD Pad, 8x10 Discharge Instruction: Apply over primary dressing as directed. Secured With Compression Wrap Kerlix Roll 4.5x3.1 (in/yd) Discharge Instruction: Apply Kerlix and Coban compression as directed. Coban Self-Adherent Wrap 4x5 (in/yd) Discharge Instruction: Apply over Kerlix as directed. Compression Stockings Add-Ons Electronic Signature(s) Signed: 06/10/2023 4:29:08 PM By: Zenaida Deed RN, [name] Entered By: Zenaida Deed on 06/10/2023 11:09:58 [name] ([id number]) [id number] Page 9 of 16 -------------------------------------------------------------------------------- Wound Assessment Details Patient Name: Date of Service: [place], [name] 06/10/2023 11:15 A M Medical Record Number: [id number] Patient Account Number: [id number] Date of Birth/Sex: Treating RN: Jan 03, 1951 (72 y.o. [name], Bonita Quin Primary Care Paulita Licklider: Neale Burly Other Clinician: Referring Cattie Tineo: Treating Arlis Everly/Extender: Jolyn Lent in Treatment: 4 Wound Status Wound Number: 20 Primary Venous Leg Ulcer Etiology: Wound Location: Left, Anterior Lower Leg Wound Healed - Epithelialized Wounding Event: Blister Status: Date Acquired: 02/19/2023 Comorbid Glaucoma, Arrhythmia, Hypertension, Peripheral Arterial Disease, Weeks Of Treatment: 4 History: Peripheral Venous Disease, Gout, Osteoarthritis Clustered Wound: Yes Photos Wound Measurements Length: (cm) Width: (cm) Depth: (cm) Clustered Quantity: Area: (cm) Volume: (cm) 0 % Reduction in Area: 100% 0 % Reduction in Volume: 100% 0 Epithelialization: Medium (34-66%) 2 0 0 Wound Description Classification: Full Thickness Without Exposed Sup Wound Margin: Distinct, outline attached Exudate Amount: Small Exudate Type: Serosanguineous Exudate Color: red, brown port Structures Foul Odor After Cleansing: No Slough/Fibrino No Wound Bed Granulation Amount: Large (67-100%) Exposed Structure Granulation Quality: Pink Fascia Exposed: No Necrotic Amount: Small (1-33%) Fat Layer (Subcutaneous Tissue) Exposed: Yes Necrotic Quality: Eschar Tendon Exposed: No Muscle Exposed: No Joint Exposed: No Bone Exposed: No Periwound Skin Texture Texture Color No Abnormalities Noted: Yes No Abnormalities Noted: No Hemosiderin Staining: Yes Moisture No Abnormalities Noted: No Temperature / Pain Dry / Scaly: No Temperature: No  Abnormality Maceration: No Tenderness on Palpation: Yes Treatment Notes Wound #20 (Lower Leg) Wound Laterality: Left, Anterior Chittick, Lorri B ([id number]) [id number] Page 10 of 16 Cleanser Peri-Wound Care Topical Primary Dressing Secondary Dressing Secured With Compression Wrap Compression Stockings Add-Ons Electronic Signature(s) Signed: 06/10/2023 4:29:08 PM By: Zenaida Deed RN, [name] Entered By: Zenaida Deed on 06/10/2023 11:25:50 -------------------------------------------------------------------------------- Wound Assessment Details Patient Name: Date of Service: [name], [name] 06/10/2023 11:15 A M Medical Record Number: [id number] Patient Account Number: [id number] Date of Birth/Sex: Treating RN: 05-Dec-1951 (72 y.o. F) Primary Care Davine Coba: Neale Burly Other Clinician: Referring Avielle Imbert: Treating Salia Cangemi/Extender: Jolyn Lent in Treatment: 4 Wound Status Wound Number: 21 Primary Venous Leg Ulcer Etiology: Wound Location: Left, Lateral Lower Leg Wound Open Wounding Event: Gradually Appeared Status: Date Acquired: 05/21/2023 Comorbid Glaucoma, Arrhythmia, Hypertension, Peripheral Arterial Disease, Weeks Of Treatment: 2 History: Peripheral Venous Disease, Gout, Osteoarthritis Clustered Wound: No Photos Wound Measurements Length: (cm) 1.8 Width: (cm) 1.3 Depth: (cm) 0.1 Area: (cm) 1.838 Volume: (cm) 0.184 % Reduction in Area: -157.1% % Reduction in Volume: -159.2% Epithelialization: Small (1-33%) Tunneling: No Undermining: No Wound Description Classification: Full Thickness Without Exposed Support Wound Margin: Flat and Intact Exudate Amount: Medium Exudate Type: Serosanguineous Exudate Color: red, brown Structures Foul Odor After Cleansing: No Slough/Fibrino Yes Wound Bed Poon,  Kristinia B ([id number]) [id number] Page 11 of 16 Granulation Amount: Large  (67-100%) Exposed Structure Granulation Quality: Red Fascia Exposed: No Necrotic Amount: Small (1-33%) Fat Layer (Subcutaneous Tissue) Exposed: Yes Necrotic Quality: Adherent Slough Tendon Exposed: No Muscle Exposed: No Joint Exposed: No Bone Exposed: No Periwound Skin Texture Texture Color No Abnormalities Noted: Yes No Abnormalities Noted: No Hemosiderin Staining: Yes Moisture No Abnormalities Noted: Yes Temperature / Pain Temperature: No Abnormality Tenderness on Palpation: Yes Treatment Notes Wound #21 (Lower Leg) Wound Laterality: Left, Lateral Cleanser Vashe 5.8 (oz) Discharge Instruction: Cleanse the wound with Vashe prior to applying a clean dressing using gauze sponges, not tissue or cotton balls. Peri-Wound Care Triamcinolone 15 (g) Discharge Instruction: Use triamcinolone 15 (g) as directed Sween Lotion (Moisturizing lotion) Discharge Instruction: Apply moisturizing lotion as directed Topical Primary Dressing Maxorb Extra Ag+ Alginate Dressing, 4x4.75 (in/in) Discharge Instruction: Apply to wound bed as instructed Secondary Dressing ABD Pad, 8x10 Discharge Instruction: Apply over primary dressing as directed. Secured With Compression Wrap Kerlix Roll 4.5x3.1 (in/yd) Discharge Instruction: Apply Kerlix and Coban compression as directed. Coban Self-Adherent Wrap 4x5 (in/yd) Discharge Instruction: Apply over Kerlix as directed. Compression Stockings Add-Ons Electronic Signature(s) Signed: 06/10/2023 4:29:08 PM By: Zenaida Deed RN, [name] Entered By: Zenaida Deed on 06/10/2023 11:12:21 -------------------------------------------------------------------------------- Wound Assessment Details Patient Name: Date of Service: [name], [name] 06/10/2023 11:15 A M Medical Record Number: [id number] Patient Account Number: [id number] Date of Birth/Sex: Treating RN: 09-21-1951 (72 y.o. F) Primary Care Cornelious Diven: Neale Burly Other Clinician: Referring  Takumi Din: Treating Somer Trotter/Extender: Jolyn Lent in Treatment: 4 Wound Status LEODA, SORCE B ([id number]) [id number] Page 12 of 16 Wound Number: 22 Primary Venous Leg Ulcer Etiology: Wound Location: Left, Proximal, Lateral Lower Leg Wound Open Wounding Event: Blister Status: Date Acquired: 05/22/2023 Comorbid Glaucoma, Arrhythmia, Hypertension, Peripheral Arterial Disease, Weeks Of Treatment: 2 History: Peripheral Venous Disease, Gout, Osteoarthritis Clustered Wound: No Photos Wound Measurements Length: (cm) 1.2 Width: (cm) 0.6 Depth: (cm) 0.1 Area: (cm) 0.565 Volume: (cm) 0.057 % Reduction in Area: 94% % Reduction in Volume: 93.9% Epithelialization: None Tunneling: No Undermining: No Wound Description Classification: Full Thickness Without Exposed Support Structures Wound Margin: Distinct, outline attached Exudate Amount: Medium Exudate Type: Serosanguineous Exudate Color: red, brown Foul Odor After Cleansing: No Slough/Fibrino Yes Wound Bed Granulation Amount: None Present (0%) Exposed Structure Necrotic Amount: Large (67-100%) Fat Layer (Subcutaneous Tissue) Exposed: Yes Necrotic Quality: Adherent Slough Periwound Skin Texture Texture Color No Abnormalities Noted: Yes No Abnormalities Noted: No Hemosiderin Staining: Yes Moisture No Abnormalities Noted: No Temperature / Pain Dry / Scaly: No Temperature: No Abnormality Maceration: No Tenderness on Palpation: Yes Treatment Notes Wound #22 (Lower Leg) Wound Laterality: Left, Lateral, Proximal Cleanser Vashe 5.8 (oz) Discharge Instruction: Cleanse the wound with Vashe prior to applying a clean dressing using gauze sponges, not tissue or cotton balls. Peri-Wound Care Triamcinolone 15 (g) Discharge Instruction: Use triamcinolone 15 (g) as directed Sween Lotion (Moisturizing lotion) Discharge Instruction: Apply moisturizing lotion as  directed Topical Primary Dressing Maxorb Extra Ag+ Alginate Dressing, 4x4.75 (in/in) Discharge Instruction: Apply to wound bed as instructed Secondary Dressing ABD Pad, 8x10 Discharge Instruction: Apply over primary dressing as directed. Secured With TAKODA, FOSKETT ([id number]) [id number] Page 13 of 16 Compression Wrap Kerlix Roll 4.5x3.1 (in/yd) Discharge Instruction: Apply Kerlix and Coban compression as directed. Coban Self-Adherent Wrap 4x5 (in/yd) Discharge Instruction: Apply over Kerlix as directed. Compression Stockings Add-Ons Electronic Signature(s) Signed: 06/10/2023 4:29:08 PM By: Zenaida Deed RN, [name] Entered By: Daneil Dan,  [name] on 06/10/2023 11:12:50 -------------------------------------------------------------------------------- Wound Assessment Details Patient Name: Date of Service: [place], [name] 06/10/2023 11:15 A M Medical Record Number: [id number] Patient Account Number: [id number] Date of Birth/Sex: Treating RN: 21-Jan-1951 (72 y.o. F) Primary Care Jakayden Cancio: Neale Burly Other Clinician: Referring Shanda Cadotte: Treating Icarus Partch/Extender: Jolyn Lent in Treatment: 4 Wound Status Wound Number: 23 Primary Venous Leg Ulcer Etiology: Wound Location: Left, Proximal, Posterior Lower Leg Wound Open Wounding Event: Blister Status: Date Acquired: 05/22/2023 Comorbid Glaucoma, Arrhythmia, Hypertension, Peripheral Arterial Disease, Weeks Of Treatment: 2 History: Peripheral Venous Disease, Gout, Osteoarthritis Clustered Wound: No Wound Measurements Length: (cm) 0.1 Width: (cm) 0.1 Depth: (cm) 0.1 Area: (cm) 0.008 Volume: (cm) 0.001 % Reduction in Area: 98.6% % Reduction in Volume: 98.2% Epithelialization: Large (67-100%) Tunneling: No Undermining: No Wound Description Classification: Full Thickness Without Exposed Support Structures Wound Margin: Flat and Intact Exudate Amount: Small Exudate  Type: Serous Exudate Color: amber Foul Odor After Cleansing: No Slough/Fibrino Yes Wound Bed Granulation Amount: None Present (0%) Exposed Structure Necrotic Amount: Large (67-100%) Fascia Exposed: No Necrotic Quality: Adherent Slough Fat Layer (Subcutaneous Tissue) Exposed: Yes Tendon Exposed: No Muscle Exposed: No Joint Exposed: No Bone Exposed: No Periwound Skin Texture Texture Color No Abnormalities Noted: Yes No Abnormalities Noted: No Hemosiderin Staining: Yes Moisture No Abnormalities Noted: No Temperature / Pain Dry / Scaly: No Temperature: No Abnormality Maceration: No Tenderness on Palpation: Yes Electronic Signature(s) Signed: 06/10/2023 4:29:08 PM By: Zenaida Deed RN, [name] Helle, Karmina 06/10/2023 4:29:08 PM By: Zenaida Deed RN, [name] Signed: B ([id number]) [id number] Page 14 of 16 Entered By: Zenaida Deed on 06/10/2023 11:13:39 -------------------------------------------------------------------------------- Wound Assessment Details Patient Name: Date of Service: [place], [name] 06/10/2023 11:15 A M Medical Record Number: [id number] Patient Account Number: [id number] Date of Birth/Sex: Treating RN: 1951-08-25 (72 y.o. F) Primary Care Tanice Petre: Neale Burly Other Clinician: Referring Aydin Hink: Treating Eduar Kumpf/Extender: Jolyn Lent in Treatment: 4 Wound Status Wound Number: 23 Primary Venous Leg Ulcer Etiology: Wound Location: Left, Proximal, Posterior Lower Leg Wound Open Wounding Event: Blister Status: Date Acquired: 05/22/2023 Comorbid Glaucoma, Arrhythmia, Hypertension, Peripheral Arterial Disease, Weeks Of Treatment: 2 History: Peripheral Venous Disease, Gout, Osteoarthritis Clustered Wound: No Photos Wound Measurements Length: (cm) 0.1 Width: (cm) 0.1 Depth: (cm) 0.1 Area: (cm) 0.008 Volume: (cm) 0.001 % Reduction in Area: 98.6% % Reduction in Volume: 98.2% Wound  Description Classification: Full Thickness Without Exposed Suppo Exudate Amount: Small Exudate Type: Serous Exudate Color: amber rt Structures Periwound Skin Texture Texture Color No Abnormalities Noted: No No Abnormalities Noted: No Moisture No Abnormalities Noted: No Treatment Notes Wound #23 (Lower Leg) Wound Laterality: Left, Posterior, Proximal Cleanser Vashe 5.8 (oz) Discharge Instruction: Cleanse the wound with Vashe prior to applying a clean dressing using gauze sponges, not tissue or cotton balls. Peri-Wound Care Triamcinolone 15 (g) Discharge Instruction: Use triamcinolone 15 (g) as directed Sween Lotion (Moisturizing lotion) Discharge Instruction: Apply moisturizing lotion as directed LUWANDA, PRIDE B ([id number]) [id number] Page 15 of 16 Topical Primary Dressing Maxorb Extra Ag+ Alginate Dressing, 4x4.75 (in/in) Discharge Instruction: Apply to wound bed as instructed Secondary Dressing ABD Pad, 8x10 Discharge Instruction: Apply over primary dressing as directed. Secured With Compression Wrap Kerlix Roll 4.5x3.1 (in/yd) Discharge Instruction: Apply Kerlix and Coban compression as directed. Coban Self-Adherent Wrap 4x5 (in/yd) Discharge Instruction: Apply over Kerlix as directed. Compression Stockings Add-Ons Electronic Signature(s) Signed: 06/10/2023 11:48:29 AM By: Dayton Scrape Entered By: Dayton Scrape on 06/10/2023 11:14:50 -------------------------------------------------------------------------------- Wound Assessment Details Patient Name:  Date of Service: [place], [name] 06/10/2023 11:15 A M Medical Record Number: [id number] Patient Account Number: [id number] Date of Birth/Sex: Treating RN: 12/11/1951 (72 y.o. Tommye Standard Primary Care Janin Kozlowski: Neale Burly Other Clinician: Referring Lexany Belknap: Treating Azarie Coriz/Extender: Jolyn Lent in Treatment: 4 Wound Status Wound Number: 24 Primary  Venous Leg Ulcer Etiology: Wound Location: Left, Medial Lower Leg Wound Healed - Epithelialized Wounding Event: Blister Status: Date Acquired: 05/22/2023 Comorbid Glaucoma, Arrhythmia, Hypertension, Peripheral Arterial Disease, Weeks Of Treatment: 2 History: Peripheral Venous Disease, Gout, Osteoarthritis Clustered Wound: Yes Photos Wound Measurements Length: (cm) Width: (cm) Depth: (cm) Clustered Quantity: Area: (cm) Volume: (cm) Bissonette, Camrie B ([id number]) Wound Description Classification: Full Thickness Without Exposed Suppor Exudate Amount: None Present t Structures Foul Odor After Cleansing: Slough/Fibrino 0 % Reduction in Area: 100% 0 % Reduction in Volume: 100% 0 Epithelialization: Large (67-100%) 6 Tunneling: No 0 Undermining: No 0 [id number] Page 16 of 16 No No Wound Bed Granulation Amount: Small (1-33%) Exposed Structure Granulation Quality: Pink Fat Layer (Subcutaneous Tissue) Exposed: Yes Necrotic Amount: None Present (0%) Periwound Skin Texture Texture Color No Abnormalities Noted: No No Abnormalities Noted: No Scarring: Yes Hemosiderin Staining: Yes Moisture Temperature / Pain No Abnormalities Noted: No Temperature: No Abnormality Dry / Scaly: No Tenderness on Palpation: Yes Maceration: No Treatment Notes Wound #24 (Lower Leg) Wound Laterality: Left, Medial Cleanser Peri-Wound Care Topical Primary Dressing Secondary Dressing Secured With Compression Wrap Compression Stockings Add-Ons Electronic Signature(s) Signed: 06/10/2023 4:29:08 PM By: Zenaida Deed RN, [name] Entered By: Zenaida Deed on 06/10/2023 11:25:33 -------------------------------------------------------------------------------- Vitals Details Patient Name: Date of Service: [name], [name] 06/10/2023 11:15 A M Medical Record Number: [id number] Patient Account Number: [id number] Date of Birth/Sex: Treating RN: 1951-03-12 (72 y.o. F) Primary Care  Leevi Cullars: Neale Burly Other Clinician: Referring Chief Walkup: Treating Zaydah Nawabi/Extender: Jolyn Lent in Treatment: 4 Vital Signs Time Taken: 10:44 Temperature (F): 98.6 Height (in): 64 Pulse (bpm): 73 Weight (lbs): 138 Respiratory Rate (breaths/min): 20 Body Mass Index (BMI): 23.7 Blood Pressure (mmHg): 194/70 Reference Range: 80 - 120 mg / dl Electronic Signature(s) Signed: 06/10/2023 4:29:08 PM By: Zenaida Deed RN, [name] Entered By: Zenaida Deed on 06/10/2023 11:08:51
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
## 2023-06-15 ENCOUNTER — Other Ambulatory Visit: Payer: Self-pay

## 2023-06-16 ENCOUNTER — Inpatient Hospital Stay (HOSPITAL_BASED_OUTPATIENT_CLINIC_OR_DEPARTMENT_OTHER): Payer: 59 | Admitting: Nurse Practitioner

## 2023-06-16 ENCOUNTER — Other Ambulatory Visit: Payer: Self-pay

## 2023-06-16 ENCOUNTER — Telehealth: Payer: Self-pay | Admitting: Nurse Practitioner

## 2023-06-16 ENCOUNTER — Encounter: Payer: Self-pay | Admitting: Nurse Practitioner

## 2023-06-16 ENCOUNTER — Inpatient Hospital Stay: Payer: 59 | Attending: Hematology

## 2023-06-16 ENCOUNTER — Inpatient Hospital Stay: Payer: 59

## 2023-06-16 VITALS — BP 117/71 | HR 71 | Temp 98.8°F | Resp 18

## 2023-06-16 VITALS — BP 120/71 | HR 72 | Temp 98.2°F | Resp 18 | Wt 129.0 lb

## 2023-06-16 DIAGNOSIS — Z7982 Long term (current) use of aspirin: Secondary | ICD-10-CM | POA: Insufficient documentation

## 2023-06-16 DIAGNOSIS — D45 Polycythemia vera: Secondary | ICD-10-CM

## 2023-06-16 DIAGNOSIS — Z7901 Long term (current) use of anticoagulants: Secondary | ICD-10-CM | POA: Diagnosis not present

## 2023-06-16 DIAGNOSIS — D72829 Elevated white blood cell count, unspecified: Secondary | ICD-10-CM | POA: Insufficient documentation

## 2023-06-16 DIAGNOSIS — D509 Iron deficiency anemia, unspecified: Secondary | ICD-10-CM | POA: Insufficient documentation

## 2023-06-16 DIAGNOSIS — I10 Essential (primary) hypertension: Secondary | ICD-10-CM | POA: Diagnosis not present

## 2023-06-16 DIAGNOSIS — Z79899 Other long term (current) drug therapy: Secondary | ICD-10-CM | POA: Insufficient documentation

## 2023-06-16 LAB — CBC WITH DIFFERENTIAL (CANCER CENTER ONLY)
Abs Immature Granulocytes: 0.94 10*3/uL — ABNORMAL HIGH (ref 0.00–0.07)
Basophils Absolute: 0.3 10*3/uL — ABNORMAL HIGH (ref 0.0–0.1)
Basophils Relative: 1 %
Eosinophils Absolute: 0.6 10*3/uL — ABNORMAL HIGH (ref 0.0–0.5)
Eosinophils Relative: 3 %
HCT: 52.2 % — ABNORMAL HIGH (ref 36.0–46.0)
Hemoglobin: 15.6 g/dL — ABNORMAL HIGH (ref 12.0–15.0)
Immature Granulocytes: 4 %
Lymphocytes Relative: 5 %
Lymphs Abs: 1.3 10*3/uL (ref 0.7–4.0)
MCH: 25.5 pg — ABNORMAL LOW (ref 26.0–34.0)
MCHC: 29.9 g/dL — ABNORMAL LOW (ref 30.0–36.0)
MCV: 85.4 fL (ref 80.0–100.0)
Monocytes Absolute: 0.7 10*3/uL (ref 0.1–1.0)
Monocytes Relative: 3 %
Neutro Abs: 19.9 10*3/uL — ABNORMAL HIGH (ref 1.7–7.7)
Neutrophils Relative %: 84 %
Platelet Count: 371 10*3/uL (ref 150–400)
RBC: 6.11 MIL/uL — ABNORMAL HIGH (ref 3.87–5.11)
RDW: 24.1 % — ABNORMAL HIGH (ref 11.5–15.5)
WBC Count: 23.7 10*3/uL — ABNORMAL HIGH (ref 4.0–10.5)
nRBC: 0.3 % — ABNORMAL HIGH (ref 0.0–0.2)

## 2023-06-16 NOTE — Progress Notes (Signed)
Per order- therapeutic phlebotomy completed. Hct >45% per MD stated parameter. Patient Hct today was 52.2. 16g kit used in the patient's R arm with no issues. Started at 1215 and ended at 1123- 501gm removed. Patient offered snacks and fluids. Observed for 30 minutes. BP 117/71 (BP Location: Left Arm, Patient Position: Sitting)   Pulse 71   Temp 98.8 F (37.1 C) (Oral)   Resp 18   SpO2 98%   Ambulatory to the lobby with walker.

## 2023-06-16 NOTE — Telephone Encounter (Signed)
Patient is aware of upcoming appointment time/dates 

## 2023-06-16 NOTE — Patient Instructions (Signed)
Therapeutic Phlebotomy Therapeutic phlebotomy is the planned removal of blood from a person's body for the purpose of treating a medical condition. The procedure is lot like donating blood. Usually, about a pint (470 mL, or 0.47 L) of blood is removed. The average adult has 9-12 pints (4.3-5.7 L) of blood in his or her body. Therapeutic phlebotomy may be used to treat the following medical conditions: Hemochromatosis. This is a condition in which the blood contains too much iron. Polycythemia vera. This is a condition in which the blood contains too many red blood cells. Porphyria cutanea tarda. This is a disease in which an important part of hemoglobin is not made properly. It results in the buildup of abnormal amounts of porphyrins in the body. Sickle cell disease. This is a condition in which the red blood cells form an abnormal crescent shape rather than a round shape. Tell a health care provider about: Any allergies you have. All medicines you are taking, including vitamins, herbs, eye drops, creams, and over-the-counter medicines. Any bleeding problems you have. Any surgeries you have had. Any medical conditions you have. Whether you are pregnant or may be pregnant. What are the risks? Generally, this is a safe procedure. However, problems may occur, including: Nausea or light-headedness. Low blood pressure (hypotension). Soreness, bleeding, swelling, or bruising at the needle insertion site. Infection. What happens before the procedure? Ask your health care provider about: Changing or stopping your regular medicines. This is especially important if you are taking diabetes medicines or blood thinners. Taking medicines such as aspirin and ibuprofen. These medicines can thin your blood. Do not take these medicines unless your health care provider tells you to take them. Taking over-the-counter medicines, vitamins, herbs, and supplements. Wear clothing with sleeves that can be raised  above the elbow. You may have a blood sample taken. Your blood pressure, pulse rate, and breathing rate will be measured. What happens during the procedure?  You may be given a medicine to numb the area (local anesthetic). A tourniquet will be placed on your arm. A needle will be put into one of your veins. Tubing and a collection bag will be attached to the needle. Blood will flow through the needle and tubing into the collection bag. The collection bag will be placed lower than your arm so gravity can help the blood flow into the bag. You may be asked to open and close your hand slowly and continually during the entire collection. After the specified amount of blood has been removed from your body, the collection bag and tubing will be clamped. The needle will be removed from your vein. Pressure will be held on the needle site to stop the bleeding. A bandage (dressing) will be placed over the needle insertion site. The procedure may vary among health care providers and hospitals. What happens after the procedure? Your blood pressure, pulse rate, and breathing rate will be measured after the procedure. You will be encouraged to drink fluids. You will be encouraged to eat a snack to prevent a low blood sugar level. Your recovery will be assessed and monitored. Return to your normal activities as told by your health care provider. Summary Therapeutic phlebotomy is the planned removal of blood from a person's body for the purpose of treating a medical condition. Therapeutic phlebotomy may be used to treat hemochromatosis, polycythemia vera, porphyria cutanea tarda, or sickle cell disease. In the procedure, a needle is inserted and about a pint (470 mL, or 0.47 L) of blood is   removed. The average adult has 9-12 pints (4.3-5.7 L) of blood in the body. This is generally a safe procedure, but it can sometimes cause problems such as nausea, light-headedness, or low blood pressure  (hypotension). This information is not intended to replace advice given to you by your health care provider. Make sure you discuss any questions you have with your health care provider. Document Revised: 06/04/2021 Document Reviewed: 06/04/2021 Elsevier Patient Education  2024 [employer]   

## 2023-06-17 ENCOUNTER — Encounter (HOSPITAL_BASED_OUTPATIENT_CLINIC_OR_DEPARTMENT_OTHER): Payer: 59 | Admitting: General Surgery

## 2023-06-17 DIAGNOSIS — L97822 Non-pressure chronic ulcer of other part of left lower leg with fat layer exposed: Secondary | ICD-10-CM | POA: Diagnosis not present

## 2023-06-17 NOTE — Progress Notes (Signed)
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

## 2023-06-17 NOTE — Progress Notes (Signed)
PATI, Stacey ([id number]) [id number].pdf Page 1 of 13 Visit Report for 06/17/2023 Arrival Information Details Patient Name: Date of Service: [place], [name] 06/17/2023 11:00 A M Medical Record Number: [id number] Patient Account Number: [id number] Date of Birth/Sex: Treating RN: [birth year] (72 y.o. F) Primary Care [name]: [name] Other Clinician: Referring [name]: Treating [name]/Extender: [name]: 5 Visit Information History Since Last Visit All ordered tests and consults were completed: No Patient Arrived: [name] Added or deleted any medications: No Arrival Time: 10:52 Any new allergies or adverse reactions: No Accompanied By: self Had a fall or experienced change in No Transfer Assistance: None activities of daily living that may affect Patient Identification Verified: Yes risk of falls: Secondary Verification Process Completed: Yes Signs or symptoms of abuse/neglect since last visito No Patient Requires Transmission-Based Precautions: No Hospitalized since last visit: No Patient Has Alerts: No Implantable device outside of the clinic excluding No cellular tissue based products placed in the center since last visit: Pain Present Now: No Electronic Signature(s) Signed: 06/17/2023 2:34:30 PM By: [name] Entered By: [name] on 06/17/2023 10:52:34 -------------------------------------------------------------------------------- Encounter Discharge Information Details Patient Name: Date of Service: [name], [name] 06/17/2023 11:00 A M Medical Record Number: [id number] Patient Account Number: [id number] Date of Birth/Sex: Treating RN: February 22, 1951 (72 y.o. [name] Primary Care [name]: [name] Other Clinician: Referring [name]: Treating [name]/Extender: [name]: 5 Encounter Discharge Information Items Post Procedure  Vitals Discharge Condition: Stable Temperature (F): 98.2 Ambulatory Status: Walker Pulse (bpm): 84 Discharge Destination: Home Respiratory Rate (breaths/min): 18 Transportation: Private Auto Blood Pressure (mmHg): 168/74 Accompanied By: self Schedule Follow-up Appointment: Yes Clinical Summary of Care: Patient Declined Notes Scribed for Dr Lady Gary by Stacey Grills RN. Electronic Signature(s) Signed: 06/17/2023 12:38:14 PM By: [name] Entered By: [name] on 06/17/2023 11:37:06 [name] ([id number]) [id number] Page 2 of 13 -------------------------------------------------------------------------------- Lower Extremity Assessment Details Patient Name: Date of Service: [name], [name] 06/17/2023 11:00 A M Medical Record Number: [id number] Patient Account Number: [id number] Date of Birth/Sex: Treating RN: August 30, 1951 (72 y.o. [name] Primary Care [name]: [name] Other Clinician: Referring [name]: Treating Jaiceon Collister/Extender: [name] in [name]: 5 Edema Assessment Assessed: [Left: No] [Right: No] Edema: [Left: N] [Right: o] Calf Left: Right: Point of Measurement: From Medial Instep 28.8 cm Ankle Left: Right: Point of Measurement: From Medial Instep 18 cm Vascular Assessment Pulses: Dorsalis Pedis Palpable: [Left:Yes] Electronic Signature(s) Signed: 06/17/2023 12:38:14 PM By: [name] Entered By: [name] on 06/17/2023 11:01:48 -------------------------------------------------------------------------------- Multi Wound Chart Details Patient Name: Date of Service: Stacey [name], [name] 06/17/2023 11:00 A M Medical Record Number: [id number] Patient Account Number: [id number] Date of Birth/Sex: Treating RN: 09-23-1951 (72 y.o. F) Primary Care [name]: [name] Other Clinician: Referring [name]: Treating [name]/Extender: [name]: 5 Vital Signs Height(in): 64 Pulse(bpm): 77 Weight(lbs): 138 Blood Pressure(mmHg): Body Mass Index(BMI): 23.7 Temperature(F): 97.8 Respiratory Rate(breaths/min): 18 [19:Photos:] [id number] Page 3 of 13] Left, Posterior Lower Leg Left, Lateral Lower Leg Left, Proximal, Lateral Lower Leg Wound Location: Blister Gradually Appeared Blister Wounding Event: Venous Leg Ulcer Venous Leg Ulcer Venous Leg Ulcer Primary Etiology: Glaucoma, Arrhythmia, Hypertension, Glaucoma, Arrhythmia, Hypertension, Glaucoma, Arrhythmia, Hypertension, Comorbid History: Peripheral Arterial Disease, Peripheral Peripheral Arterial Disease, Peripheral Peripheral Arterial Disease, Peripheral Venous Disease, Gout, Osteoarthritis Venous Disease, Gout, Osteoarthritis Venous Disease, Gout, Osteoarthritis 02/19/2023 05/21/2023 05/22/2023 Date Acquired: 5  3 3 [name] of [name]: Open Open Open Wound Status: No No No Wound Recurrence: Yes No No Clustered Wound: 2x1.3x0.1 1.8x1.2x0.1 1.2x0.7x0.1 Measurements L x W x D (cm) 2.042 1.696 0.66 A (cm) : rea 0.204 0.17 0.066 Volume (cm) : 92.50% -137.20% 93.00% % Reduction in A rea: 92.50% -139.40% 93.00% % Reduction in Volume: Full Thickness Without Exposed Full Thickness Without Exposed Full Thickness Without Exposed Classification: Support Structures Support Structures Support Structures Medium Medium Medium Exudate A mount: Serosanguineous Serosanguineous Serosanguineous Exudate Type: red, brown red, brown red, brown Exudate Color: Distinct, outline attached Flat and Intact Distinct, outline attached Wound Margin: Medium (34-66%) Large (67-100%) None Present (0%) Granulation A mount: Pink Red N/A Granulation Quality: Medium (34-66%) Small (1-33%) Large (67-100%) Necrotic A mount: Fat Layer (Subcutaneous Tissue): Yes Fat Layer (Subcutaneous Tissue): Yes Fat Layer (Subcutaneous Tissue):  Yes Exposed Structures: Fascia: No Tendon: No Muscle: No Joint: No Bone: No Small (1-33%) Small (1-33%) None Epithelialization: Debridement - Selective/Open Wound Debridement - Selective/Open Wound Debridement - Selective/Open Wound Debridement: Pre-procedure Verification/Time Out 11:21 11:21 11:21 Taken: Lidocaine 4% Topical Solution Lidocaine 4% Topical Solution Lidocaine 4% Topical Solution Pain Control: [facility] Tissue Debrided: Non-Viable Tissue Non-Viable Tissue Non-Viable Tissue Level: 2.04 1.7 0.66 Debridement A (sq cm): rea Curette Curette Curette Instrument: Minimum Minimum Minimum Bleeding: Pressure Pressure Pressure Hemostasis A chieved: 0 0 0 Procedural Pain: 0 0 0 Post Procedural Pain: Procedure was tolerated well Procedure was tolerated well Procedure was tolerated well Debridement [name] Response: 2x1.3x0.1 1.8x1.2x0.1 1.2x0.7x0.1 Post Debridement Measurements L x W x D (cm) 0.204 0.17 0.066 Post Debridement Volume: (cm) Scarring: Yes No Abnormalities Noted Scarring: No Periwound Skin Texture: Maceration: No No Abnormalities Noted Maceration: No Periwound Skin Moisture: Dry/Scaly: No Dry/Scaly: No Hemosiderin Staining: Yes Hemosiderin Staining: Yes Hemosiderin Staining: Yes Periwound Skin Color: No Abnormality No Abnormality No Abnormality Temperature: Yes Yes Yes Tenderness on Palpation: Debridement Debridement Debridement Procedures Performed: Wound Number: 23 N/A N/A Photos: No Photos N/A N/A Left, Proximal, Posterior Lower Leg N/A N/A Wound Location: Blister N/A N/A Wounding Event: Venous Leg Ulcer N/A N/A Primary Etiology: Glaucoma, Arrhythmia, Hypertension, N/A N/A Comorbid History: Peripheral Arterial Disease, Peripheral Venous Disease, Gout, Osteoarthritis 05/22/2023 N/A N/A Date Acquired: 3 N/A N/A [name] of [name]: Healed - Epithelialized N/A N/A Wound Status: No N/A N/A Wound Recurrence: No N/A  N/A Clustered Wound: 0x0x0 N/A N/A Measurements L x W x D (cm) 0 N/A N/A A (cm) : rea 0 N/A N/A Volume (cm) : 100.00% N/A N/A % Reduction in Area: 100.00% N/A N/A % Reduction in Volume: Full Thickness Without Exposed N/A N/A Classification: Support Structures Small N/A N/A Exudate A mount: Serous N/A N/A Exudate Type: amber N/A N/A Exudate Color: N/A N/A N/A Wound Margin: N/A N/A N/A Granulation A mount: N/A N/A N/A Granulation Quality: N/A N/A N/A Necrotic A mount: N/A N/A N/A Exposed Structures: N/A N/A N/A EpithelializationZURIYAH, [name] ([id number]) [id number] Page 4 of 13 N/A N/A N/A Debridement: N/A N/A N/A Pain Control: N/A N/A N/A Tissue Debrided: N/A N/A N/A Level: N/A N/A N/A Debridement A (sq cm): rea N/A N/A N/A Instrument: N/A N/A N/A Bleeding: N/A N/A N/A Hemostasis A chieved: N/A N/A N/A Procedural Pain: N/A N/A N/A Post Procedural Pain: N/A N/A N/A Debridement [name] Response: N/A N/A N/A Post Debridement Measurements L x W x D (cm) N/A N/A N/A Post Debridement Volume: (cm) N/A N/A N/A Temperature: N/A N/A N/A Procedures Performed: [name] Notes Wound #19 (Lower Leg)  Wound Laterality: Left, Posterior Cleanser Vashe 5.8 (oz) Discharge Instruction: Cleanse the wound with Vashe prior to applying a clean dressing using gauze sponges, not tissue or cotton balls. Peri-Wound Care Triamcinolone 15 (g) Discharge Instruction: Use triamcinolone 15 (g) as directed Sween Lotion (Moisturizing lotion) Discharge Instruction: Apply moisturizing lotion as directed Topical Primary Dressing Maxorb Extra Ag+ Alginate Dressing, 4x4.75 (in/in) Discharge Instruction: Apply to wound bed as instructed Secondary Dressing ABD Pad, 8x10 Discharge Instruction: Apply over primary dressing as directed. Secured With Compression Wrap Kerlix Roll 4.5x3.1 (in/yd) Discharge Instruction: Apply Kerlix and Coban  compression as directed. Coban Self-Adherent Wrap 4x5 (in/yd) Discharge Instruction: Apply over Kerlix as directed. Compression Stockings Add-Ons Wound #21 (Lower Leg) Wound Laterality: Left, Lateral Cleanser Vashe 5.8 (oz) Discharge Instruction: Cleanse the wound with Vashe prior to applying a clean dressing using gauze sponges, not tissue or cotton balls. Peri-Wound Care Triamcinolone 15 (g) Discharge Instruction: Use triamcinolone 15 (g) as directed Sween Lotion (Moisturizing lotion) Discharge Instruction: Apply moisturizing lotion as directed Topical Primary Dressing Maxorb Extra Ag+ Alginate Dressing, 4x4.75 (in/in) Discharge Instruction: Apply to wound bed as instructed Secondary Dressing ABD Pad, 8x10 Discharge Instruction: Apply over primary dressing as directed. Secured With [name], Stacey ([id number]) [id number] Page 5 of 13 Compression Wrap Kerlix Roll 4.5x3.1 (in/yd) Discharge Instruction: Apply Kerlix and Coban compression as directed. Coban Self-Adherent Wrap 4x5 (in/yd) Discharge Instruction: Apply over Kerlix as directed. Compression Stockings Add-Ons Wound #22 (Lower Leg) Wound Laterality: Left, Lateral, Proximal Cleanser Vashe 5.8 (oz) Discharge Instruction: Cleanse the wound with Vashe prior to applying a clean dressing using gauze sponges, not tissue or cotton balls. Peri-Wound Care Triamcinolone 15 (g) Discharge Instruction: Use triamcinolone 15 (g) as directed Sween Lotion (Moisturizing lotion) Discharge Instruction: Apply moisturizing lotion as directed Topical Primary Dressing Maxorb Extra Ag+ Alginate Dressing, 4x4.75 (in/in) Discharge Instruction: Apply to wound bed as instructed Secondary Dressing ABD Pad, 8x10 Discharge Instruction: Apply over primary dressing as directed. Secured With Compression Wrap Kerlix Roll 4.5x3.1 (in/yd) Discharge Instruction: Apply Kerlix and Coban compression as directed. Coban  Self-Adherent Wrap 4x5 (in/yd) Discharge Instruction: Apply over Kerlix as directed. Compression Stockings Add-Ons Wound #23 (Lower Leg) Wound Laterality: Left, Posterior, Proximal Cleanser Peri-Wound Care Topical Primary Dressing Secondary Dressing Secured With Compression Wrap Compression Stockings Add-Ons Electronic Signature(s) Signed: 06/17/2023 11:43:50 AM By: Duanne Guess MD FACS Entered By: Duanne Guess on 06/17/2023 11:43:50 [name] ([id number]) [id number] Page 6 of 13 -------------------------------------------------------------------------------- Multi-Disciplinary Care Plan Details Patient Name: Date of Service: [place], [name] 06/17/2023 11:00 A M Medical Record Number: [id number] Patient Account Number: [id number] Date of Birth/Sex: Treating RN: [birth year] (72 y.o. [name] Primary Care Damico Partin: [name] Other Clinician: Referring Layna Roeper: Treating Buck Mcaffee/Extender: [name]: 5 Multidisciplinary Care Plan reviewed with physician Active Inactive Venous Leg Ulcer Nursing Diagnoses: Knowledge deficit related to disease process and management Potential for venous Insuffiency (use before diagnosis confirmed) Goals: Patient will maintain optimal edema control Date Initiated: 05/21/2023 Target Resolution Date: 06/18/2023 Goal Status: Active Interventions: Assess peripheral edema status every visit. Compression as ordered Provide education on venous insufficiency [name] Activities: Therapeutic compression applied : 05/21/2023 Notes: Wound/Skin Impairment Nursing Diagnoses: Impaired tissue integrity Goals: Patient/caregiver will verbalize understanding of skin care regimen Date Initiated: 05/13/2023 Target Resolution Date: 06/20/2023 Goal Status: Active Interventions: Assess patient/caregiver ability to obtain necessary supplies Assess  patient/caregiver ability to perform ulcer/skin care regimen upon admission and as needed Assess ulceration(s) every visit Provide education on  ulcer and skin care Screen for HBO [name] Activities: Skin care regimen initiated : 05/13/2023 Topical wound management initiated : 05/13/2023 Notes: Electronic Signature(s) Signed: 06/17/2023 12:38:14 PM By: [name] Entered By: [name] on 06/17/2023 11:16:05 [name] ([id number]) [id number] Page 7 of 13 -------------------------------------------------------------------------------- Pain Assessment Details Patient Name: Date of Service: [place], [name] 06/17/2023 11:00 A M Medical Record Number: [id number] Patient Account Number: [id number] Date of Birth/Sex: Treating RN: December 27, 1950 (72 y.o. F) Primary Care Fallan Mccarey: [name] Other Clinician: Referring Deveon Kisiel: Treating Merina Behrendt/Extender: [name]: 5 Active Problems Location of Pain Severity and Description of Pain Patient Has Paino No Site Locations Pain Management and Medication Current Pain Management: Electronic Signature(s) Signed: 06/17/2023 2:34:30 PM By: [name] Entered By: [name] on 06/17/2023 10:52:57 -------------------------------------------------------------------------------- Patient/Caregiver Education Details Patient Name: Date of Service: Stacey [name], [place] TSY [name] 6/27/2024andnbsp11:00 A M Medical Record Number: [id number] Patient Account Number: [id number] Date of Birth/Gender: Treating RN: 17-Jul-1951 (72 y.o. [name] Primary Care Physician: [name] Other Clinician: Referring Physician: Treating Physician/Extender: [name]: 5 Education Assessment Education Provided To: Patient Education Topics Provided Wound/Skin Impairment: Methods: Explain/Verbal Responses: State content  correctly Electronic Signature(s) Signed: 06/17/2023 12:38:14 PM By: [name] Entered By: [name] on 06/17/2023 11:16:21 [name] ([id number]) [id number] Page 8 of 13 -------------------------------------------------------------------------------- Wound Assessment Details Patient Name: Date of Service: [place], [name] 06/17/2023 11:00 A M Medical Record Number: [id number] Patient Account Number: [id number] Date of Birth/Sex: Treating RN: [birth year] (72 y.o. [name] Primary Care Shontavia Mickel: [name] Other Clinician: Referring Mekiyah Gladwell: Treating Ayari Liwanag/Extender: [name]: 5 Wound Status Wound Number: 19 Primary Venous Leg Ulcer Etiology: Wound Location: Left, Posterior Lower Leg Wound Open Wounding Event: Blister Status: Date Acquired: 02/19/2023 Comorbid Glaucoma, Arrhythmia, Hypertension, Peripheral Arterial Disease, [name] Of [name]: 5 History: Peripheral Venous Disease, Gout, Osteoarthritis Clustered Wound: Yes Photos Wound Measurements Length: (cm) 2 Width: (cm) 1.3 Depth: (cm) 0.1 Area: (cm) 2.042 Volume: (cm) 0.204 % Reduction in Area: 92.5% % Reduction in Volume: 92.5% Epithelialization: Small (1-33%) Tunneling: No Undermining: No Wound Description Classification: Full Thickness Without Exposed Suppor Wound Margin: Distinct, outline attached Exudate Amount: Medium Exudate Type: Serosanguineous Exudate Color: red, brown t Structures Foul Odor After Cleansing: No Slough/Fibrino No Wound Bed Granulation Amount: Medium (34-66%) Exposed Structure Granulation Quality: Pink Fat Layer (Subcutaneous Tissue) Exposed: Yes Necrotic Amount: Medium (34-66%) Necrotic Quality: Adherent Slough Periwound Skin Texture Texture Color No Abnormalities Noted: Yes No Abnormalities Noted: No Hemosiderin Staining: Yes Moisture No Abnormalities Noted: No Temperature /  Pain Dry / Scaly: No Temperature: No Abnormality Maceration: No Tenderness on Palpation: Yes [name] Notes Wound #19 (Lower Leg) Wound Laterality: Left, Posterior Cleanser Vashe 5.8 (oz) Gottsch, Israella [name] ([id number]) [id number] Page 9 of 13 Discharge Instruction: Cleanse the wound with Vashe prior to applying a clean dressing using gauze sponges, not tissue or cotton balls. Peri-Wound Care Triamcinolone 15 (g) Discharge Instruction: Use triamcinolone 15 (g) as directed Sween Lotion (Moisturizing lotion) Discharge Instruction: Apply moisturizing lotion as directed Topical Primary Dressing Maxorb Extra Ag+ Alginate Dressing, 4x4.75 (in/in) Discharge Instruction: Apply to wound bed as instructed Secondary Dressing ABD Pad, 8x10 Discharge Instruction: Apply over primary dressing as directed. Secured With Compression Wrap Kerlix Roll 4.5x3.1 (in/yd) Discharge Instruction: Apply Kerlix and Coban compression as directed. Coban Self-Adherent Wrap 4x5 (in/yd) Discharge Instruction: Apply over Kerlix as directed. Compression Stockings Add-Ons  Electronic Signature(s) Signed: 06/17/2023 12:38:14 PM By: [name] Entered By: [name] on 06/17/2023 11:09:05 -------------------------------------------------------------------------------- Wound Assessment Details Patient Name: Date of Service: Stacey [name], [name] 06/17/2023 11:00 A M Medical Record Number: [id number] Patient Account Number: [id number] Date of Birth/Sex: Treating RN: Jun 06, 1951 (72 y.o. [name] Primary Care Pasha Broad: [name] Other Clinician: Referring Loise Esguerra: Treating Samarion Ehle/Extender: [name]: 5 Wound Status Wound Number: 21 Primary Venous Leg Ulcer Etiology: Wound Location: Left, Lateral Lower Leg Wound Open Wounding Event: Gradually Appeared Status: Date Acquired: 05/21/2023 Comorbid Glaucoma, Arrhythmia,  Hypertension, Peripheral Arterial Disease, [name] Of [name]: 3 History: Peripheral Venous Disease, Gout, Osteoarthritis Clustered Wound: No Photos Wound Measurements [name], [name] ([id number]) Length: (cm) 1.8 Width: (cm) 1.2 Depth: (cm) 0.1 Area: (cm) 1.696 Volume: (cm) 0.17 [id number] Page 10 of 13 % Reduction in Area: -137.2% % Reduction in Volume: -139.4% Epithelialization: Small (1-33%) Tunneling: No Undermining: No Wound Description Classification: Full Thickness Without Exposed Support Structures Wound Margin: Flat and Intact Exudate Amount: Medium Exudate Type: Serosanguineous Exudate Color: red, brown Foul Odor After Cleansing: No Slough/Fibrino Yes Wound Bed Granulation Amount: Large (67-100%) Exposed Structure Granulation Quality: Red Fascia Exposed: No Necrotic Amount: Small (1-33%) Fat Layer (Subcutaneous Tissue) Exposed: Yes Necrotic Quality: Adherent Slough Tendon Exposed: No Muscle Exposed: No Joint Exposed: No Bone Exposed: No Periwound Skin Texture Texture Color No Abnormalities Noted: Yes No Abnormalities Noted: No Hemosiderin Staining: Yes Moisture No Abnormalities Noted: Yes Temperature / Pain Temperature: No Abnormality Tenderness on Palpation: Yes [name] Notes Wound #21 (Lower Leg) Wound Laterality: Left, Lateral Cleanser Vashe 5.8 (oz) Discharge Instruction: Cleanse the wound with Vashe prior to applying a clean dressing using gauze sponges, not tissue or cotton balls. Peri-Wound Care Triamcinolone 15 (g) Discharge Instruction: Use triamcinolone 15 (g) as directed Sween Lotion (Moisturizing lotion) Discharge Instruction: Apply moisturizing lotion as directed Topical Primary Dressing Maxorb Extra Ag+ Alginate Dressing, 4x4.75 (in/in) Discharge Instruction: Apply to wound bed as instructed Secondary Dressing ABD Pad, 8x10 Discharge Instruction: Apply over primary dressing as directed. Secured  With Compression Wrap Kerlix Roll 4.5x3.1 (in/yd) Discharge Instruction: Apply Kerlix and Coban compression as directed. Coban Self-Adherent Wrap 4x5 (in/yd) Discharge Instruction: Apply over Kerlix as directed. Compression Stockings Add-Ons Electronic Signature(s) Signed: 06/17/2023 12:38:14 PM By: [name] Entered By: [name] on 06/17/2023 11:09:49 [name] ([id number]) [id number] Page 11 of 13 -------------------------------------------------------------------------------- Wound Assessment Details Patient Name: Date of Service: [place], [name] 06/17/2023 11:00 A M Medical Record Number: [id number] Patient Account Number: [id number] Date of Birth/Sex: Treating RN: [birth year] (72 y.o. [name] Primary Care Deckard Stuber: [name] Other Clinician: Referring Gennie Eisinger: Treating Lysha Schrade/Extender: [name]: 5 Wound Status Wound Number: 22 Primary Venous Leg Ulcer Etiology: Wound Location: Left, Proximal, Lateral Lower Leg Wound Open Wounding Event: Blister Status: Date Acquired: 05/22/2023 Comorbid Glaucoma, Arrhythmia, Hypertension, Peripheral Arterial Disease, [name] Of [name]: 3 History: Peripheral Venous Disease, Gout, Osteoarthritis Clustered Wound: No Photos Wound Measurements Length: (cm) 1.2 Width: (cm) 0.7 Depth: (cm) 0.1 Area: (cm) 0.66 Volume: (cm) 0.066 % Reduction in Area: 93% % Reduction in Volume: 93% Epithelialization: None Tunneling: No Undermining: No Wound Description Classification: Full Thickness Without Exposed Support Structures Wound Margin: Distinct, outline attached Exudate Amount: Medium Exudate Type: Serosanguineous Exudate Color: red, brown Foul Odor After Cleansing: No Slough/Fibrino Yes Wound Bed Granulation Amount: None Present (0%) Exposed Structure Necrotic Amount: Large (67-100%) Fat Layer (Subcutaneous Tissue) Exposed:  Yes  Necrotic Quality: Adherent Slough Periwound Skin Texture Texture Color No Abnormalities Noted: Yes No Abnormalities Noted: No Hemosiderin Staining: Yes Moisture No Abnormalities Noted: No Temperature / Pain Dry / Scaly: No Temperature: No Abnormality Maceration: No Tenderness on Palpation: Yes [name] Notes Wound #22 (Lower Leg) Wound Laterality: Left, Lateral, Proximal Cleanser Vashe 5.8 (oz) Discharge Instruction: Cleanse the wound with Vashe prior to applying a clean dressing using gauze sponges, not tissue or cotton balls. Peri-Wound Care [name], [name] ([id number]) [id number] Page 12 of 13 Triamcinolone 15 (g) Discharge Instruction: Use triamcinolone 15 (g) as directed Sween Lotion (Moisturizing lotion) Discharge Instruction: Apply moisturizing lotion as directed Topical Primary Dressing Maxorb Extra Ag+ Alginate Dressing, 4x4.75 (in/in) Discharge Instruction: Apply to wound bed as instructed Secondary Dressing ABD Pad, 8x10 Discharge Instruction: Apply over primary dressing as directed. Secured With Compression Wrap Kerlix Roll 4.5x3.1 (in/yd) Discharge Instruction: Apply Kerlix and Coban compression as directed. Coban Self-Adherent Wrap 4x5 (in/yd) Discharge Instruction: Apply over Kerlix as directed. Compression Stockings Add-Ons Electronic Signature(s) Signed: 06/17/2023 12:38:14 PM By: [name] Entered By: [name] on 06/17/2023 11:10:28 -------------------------------------------------------------------------------- Wound Assessment Details Patient Name: Date of Service: Stacey [name], [name] 06/17/2023 11:00 A M Medical Record Number: [id number] Patient Account Number: [id number] Date of Birth/Sex: Treating RN: November 06, 1951 (72 y.o. [name] Primary Care Renatta Shrieves: [name] Other Clinician: Referring Ashiyah Pavlak: Treating Sukari Grist/Extender: [name]: 5 Wound  Status Wound Number: [id number] Primary Venous Leg Ulcer Etiology: Wound Location: Left, Proximal, Posterior Lower Leg Wound Healed - Epithelialized Wounding Event: Blister Status: Date Acquired: 05/22/2023 Comorbid Glaucoma, Arrhythmia, Hypertension, Peripheral Arterial Disease, [name] Of [name]: 3 History: Peripheral Venous Disease, Gout, Osteoarthritis Clustered Wound: No Wound Measurements Length: (cm) Width: (cm) Depth: (cm) Area: (cm) Volume: (cm) 0 % Reduction in Area: 100% 0 % Reduction in Volume: 100% 0 Tunneling: No 0 Undermining: No 0 Wound Description Classification: Full Thickness Without Exposed Suppor Exudate Amount: Small Exudate Type: Serous Exudate Color: amber t Structures Periwound Skin Texture Texture Color No Abnormalities Noted: No No Abnormalities Noted: No Moisture No Abnormalities Noted: No [name], [name] ([id number]) [id number] Page 13 of 13 [name] Notes Wound #23 (Lower Leg) Wound Laterality: Left, Posterior, Proximal Cleanser Peri-Wound Care Topical Primary Dressing Secondary Dressing Secured With Compression Wrap Compression Stockings Add-Ons Electronic Signature(s) Signed: 06/17/2023 12:38:14 PM By: [name] Entered By: [name] on 06/17/2023 11:07:42 -------------------------------------------------------------------------------- Vitals Details Patient Name: Date of Service: Stacey [name], PA TSY [name] 06/17/2023 11:00 A M Medical Record Number: [id number] Patient Account Number: [id number] Date of Birth/Sex: Treating RN: Jan 17, 1951 (72 y.o. F) Primary Care Ragnar Waas: [name] Other Clinician: Referring Jaquanna Ballentine: Treating Bhavya Eschete/Extender: [name]: 5 Vital Signs Time Taken: 10:52 Temperature (F): 97.8 Height (in): 64 Pulse (bpm): 77 Weight (lbs): 138 Respiratory Rate (breaths/min): 18 Body Mass Index (BMI): 23.7 Reference Range: 80 - 120 mg /  dl Electronic Signature(s) Signed: 06/17/2023 2:34:30 PM By: [name] Entered By: [name] on 06/17/2023 10:52:52

## 2023-06-20 DIAGNOSIS — D45 Polycythemia vera: Secondary | ICD-10-CM | POA: Diagnosis not present
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
## 2023-06-23 ENCOUNTER — Encounter: Payer: Self-pay | Admitting: Family Medicine

## 2023-06-23 ENCOUNTER — Ambulatory Visit (INDEPENDENT_AMBULATORY_CARE_PROVIDER_SITE_OTHER): Payer: 59 | Admitting: Family Medicine

## 2023-06-23 ENCOUNTER — Other Ambulatory Visit: Payer: Self-pay | Admitting: Family Medicine

## 2023-06-23 ENCOUNTER — Other Ambulatory Visit (INDEPENDENT_AMBULATORY_CARE_PROVIDER_SITE_OTHER): Payer: 59

## 2023-06-23 VITALS — BP 109/75 | HR 92 | Temp 97.8°F | Ht 64.5 in | Wt 129.0 lb

## 2023-06-23 DIAGNOSIS — G894 Chronic pain syndrome: Secondary | ICD-10-CM | POA: Diagnosis not present

## 2023-06-23 DIAGNOSIS — S80812D Abrasion, left lower leg, subsequent encounter: Secondary | ICD-10-CM

## 2023-06-23 DIAGNOSIS — S78111A Complete traumatic amputation at level between right hip and knee, initial encounter: Secondary | ICD-10-CM

## 2023-06-23 DIAGNOSIS — Z780 Asymptomatic menopausal state: Secondary | ICD-10-CM | POA: Diagnosis not present

## 2023-06-23 DIAGNOSIS — M25562 Pain in left knee: Secondary | ICD-10-CM

## 2023-06-23 DIAGNOSIS — G8929 Other chronic pain: Secondary | ICD-10-CM

## 2023-06-23 DIAGNOSIS — D45 Polycythemia vera: Secondary | ICD-10-CM | POA: Diagnosis not present

## 2023-06-23 DIAGNOSIS — S78111D Complete traumatic amputation at level between right hip and knee, subsequent encounter: Secondary | ICD-10-CM

## 2023-06-23 DIAGNOSIS — G546 Phantom limb syndrome with pain: Secondary | ICD-10-CM | POA: Diagnosis not present

## 2023-06-23 DIAGNOSIS — R0609 Other forms of dyspnea: Secondary | ICD-10-CM

## 2023-06-23 DIAGNOSIS — R61 Generalized hyperhidrosis: Secondary | ICD-10-CM

## 2023-06-23 MED ORDER — TRAMADOL HCL 50 MG PO TABS: 50.0000 mg | ORAL_TABLET | Freq: Two times a day (BID) | ORAL | 0 refills | Status: AC | PRN
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
## 2023-06-25 ENCOUNTER — Encounter (HOSPITAL_BASED_OUTPATIENT_CLINIC_OR_DEPARTMENT_OTHER): Payer: 59 | Attending: General Surgery | Admitting: General Surgery

## 2023-06-25 DIAGNOSIS — D45 Polycythemia vera: Secondary | ICD-10-CM | POA: Diagnosis not present

## 2023-06-25 DIAGNOSIS — Z89611 Acquired absence of right leg above knee: Secondary | ICD-10-CM | POA: Diagnosis not present

## 2023-06-25 DIAGNOSIS — I739 Peripheral vascular disease, unspecified: Secondary | ICD-10-CM | POA: Insufficient documentation

## 2023-06-25 DIAGNOSIS — L97822 Non-pressure chronic ulcer of other part of left lower leg with fat layer exposed: Secondary | ICD-10-CM | POA: Diagnosis present

## 2023-06-25 DIAGNOSIS — L97821 Non-pressure chronic ulcer of other part of left lower leg limited to breakdown of skin: Secondary | ICD-10-CM | POA: Insufficient documentation

## 2023-06-25 NOTE — Progress Notes (Signed)
Recommend patient go to ED. Especially since she was having symptoms of diaphoresis and shortness of breath.

## 2023-06-25 NOTE — Progress Notes (Signed)
Patient needs to follow up with hematology/oncology. Per their notes, if Hct is >46% she needs phlebotomy. Last phlebotomy was 06/16/23 at [facility]. Awaiting additional lab results.

## 2023-06-29 ENCOUNTER — Encounter (HOSPITAL_COMMUNITY): Payer: Self-pay

## 2023-06-29 ENCOUNTER — Other Ambulatory Visit: Payer: Self-pay

## 2023-06-29 ENCOUNTER — Ambulatory Visit: Payer: 59 | Admitting: Family Medicine

## 2023-06-29 ENCOUNTER — Emergency Department (HOSPITAL_COMMUNITY)
Admission: EM | Admit: 2023-06-29 | Discharge: 2023-06-29 | Disposition: A | Discharge status: Home / Self Care | Payer: 59 | Attending: Emergency Medicine | Admitting: Emergency Medicine

## 2023-06-29 DIAGNOSIS — N179 Acute kidney failure, unspecified: Secondary | ICD-10-CM | POA: Diagnosis not present

## 2023-06-29 DIAGNOSIS — E875 Hyperkalemia: Secondary | ICD-10-CM | POA: Insufficient documentation

## 2023-06-29 DIAGNOSIS — Z7982 Long term (current) use of aspirin: Secondary | ICD-10-CM | POA: Diagnosis not present

## 2023-06-29 DIAGNOSIS — Z79899 Other long term (current) drug therapy: Secondary | ICD-10-CM | POA: Diagnosis not present

## 2023-06-29 DIAGNOSIS — Z7901 Long term (current) use of anticoagulants: Secondary | ICD-10-CM | POA: Diagnosis not present

## 2023-06-29 DIAGNOSIS — E860 Dehydration: Secondary | ICD-10-CM | POA: Diagnosis not present

## 2023-06-29 LAB — CBC
HCT: 45.7 % (ref 36.0–46.0)
Hemoglobin: 13.7 g/dL (ref 12.0–15.0)
MCH: 25.1 pg — ABNORMAL LOW (ref 26.0–34.0)
MCHC: 30 g/dL (ref 30.0–36.0)
MCV: 83.9 fL (ref 80.0–100.0)
Platelets: 354 10*3/uL (ref 150–400)
RBC: 5.45 MIL/uL — ABNORMAL HIGH (ref 3.87–5.11)
RDW: 21.6 % — ABNORMAL HIGH (ref 11.5–15.5)
WBC: 21.8 10*3/uL — ABNORMAL HIGH (ref 4.0–10.5)
nRBC: 0 % (ref 0.0–0.2)

## 2023-06-29 LAB — ANEMIA PROFILE B
Basophils Absolute: 0.4 10*3/uL — ABNORMAL HIGH (ref 0.0–0.2)
Basos: 1 %
EOS (ABSOLUTE): 0.6 10*3/uL — ABNORMAL HIGH (ref 0.0–0.4)
Eos: 2 %
Ferritin: 64 ng/mL (ref 15–150)
Folate: 18.2 ng/mL (ref >3.0)
Hematocrit: 48.4 % — ABNORMAL HIGH (ref 34.0–46.6)
Hemoglobin: 14.5 g/dL (ref 11.1–15.9)
Immature Grans (Abs): 1.1 10*3/uL — ABNORMAL HIGH (ref 0.0–0.1)
Immature Granulocytes: 4 %
Iron Saturation: 10 % — ABNORMAL LOW (ref 15–55)
Iron: 36 ug/dL (ref 27–139)
Lymphocytes Absolute: 1.2 10*3/uL (ref 0.7–3.1)
Lymphs: 5 %
MCH: 25.2 pg — ABNORMAL LOW (ref 26.6–33.0)
MCHC: 30 g/dL — ABNORMAL LOW (ref 31.5–35.7)
MCV: 84 fL (ref 79–97)
Monocytes Absolute: 0.8 10*3/uL (ref 0.1–0.9)
Monocytes: 3 %
NRBC: 1 % — ABNORMAL HIGH (ref 0–0)
Neutrophils Absolute: 22.6 10*3/uL — ABNORMAL HIGH (ref 1.4–7.0)
Neutrophils: 85 %
Platelets: 373 10*3/uL (ref 150–450)
RBC: 5.76 x10E6/uL — ABNORMAL HIGH (ref 3.77–5.28)
RDW: 21.1 % — ABNORMAL HIGH (ref 11.7–15.4)
Retic Ct Pct: 2.5 % (ref 0.6–2.6)
Total Iron Binding Capacity: 362 ug/dL (ref 250–450)
UIBC: 326 ug/dL (ref 118–369)
Vitamin B-12: 606 pg/mL (ref 232–1245)
WBC: 26.6 10*3/uL (ref 3.4–10.8)

## 2023-06-29 LAB — CMP14+EGFR
ALT: 16 IU/L (ref 0–32)
AST: 29 IU/L (ref 0–40)
Albumin: 3.7 g/dL — ABNORMAL LOW (ref 3.8–4.8)
Alkaline Phosphatase: 109 IU/L (ref 44–121)
BUN/Creatinine Ratio: 19 (ref 12–28)
BUN: 25 mg/dL (ref 8–27)
Bilirubin Total: <0.2 mg/dL (ref 0.0–1.2)
Calcium: 8.6 mg/dL — ABNORMAL LOW (ref 8.7–10.3)
Chloride: 109 mmol/L — ABNORMAL HIGH (ref 96–106)
Creatinine, Ser: 1.35 mg/dL — ABNORMAL HIGH (ref 0.57–1.00)
Globulin, Total: 2.4 g/dL (ref 1.5–4.5)
Glucose: 64 mg/dL — ABNORMAL LOW (ref 70–99)
Potassium: 6.3 mmol/L (ref 3.5–5.2)
Sodium: 142 mmol/L (ref 134–144)
Total Protein: 6.1 g/dL (ref 6.0–8.5)
eGFR: 42 mL/min/{1.73_m2} — ABNORMAL LOW (ref >59)

## 2023-06-29 LAB — BASIC METABOLIC PANEL
Anion gap: 8 (ref 5–15)
BUN: 35 mg/dL — ABNORMAL HIGH (ref 8–23)
CO2: 18 mmol/L — ABNORMAL LOW (ref 22–32)
Calcium: 8.2 mg/dL — ABNORMAL LOW (ref 8.9–10.3)
Chloride: 109 mmol/L (ref 98–111)
Creatinine, Ser: 1.6 mg/dL — ABNORMAL HIGH (ref 0.44–1.00)
GFR, Estimated: 34 mL/min — ABNORMAL LOW (ref >60)
Glucose, Bld: 122 mg/dL — ABNORMAL HIGH (ref 70–99)
Potassium: 5.3 mmol/L — ABNORMAL HIGH (ref 3.5–5.1)
Sodium: 135 mmol/L (ref 135–145)

## 2023-06-29 LAB — BRAIN NATRIURETIC PEPTIDE: BNP: 462.8 pg/mL — ABNORMAL HIGH (ref 0.0–100.0)

## 2023-06-29 MED ORDER — SODIUM CHLORIDE 0.9 % IV BOLUS
1000.0000 mL | Freq: Once | INTRAVENOUS | Status: AC
  Administered 2023-06-29: 1000 mL via INTRAVENOUS

## 2023-06-29 NOTE — ED Triage Notes (Signed)
Pt states her PCP sent her here after having labs at office for hyperkalemia.  Pt states she has no complaints at time of triage.

## 2023-06-29 NOTE — Discharge Instructions (Signed)
You did appear little bit dehydrated, we have given you some IV fluids.  You can follow-up with your family doctor for recheck as needed.  Your potassium level was reassuring.  ER for worsening symptoms

## 2023-06-29 NOTE — Progress Notes (Signed)
Osteoporosis on scan. Patient was previously on Fosamax. Can we ask why it was stopped? If willing, we can start prolia.

## 2023-06-29 NOTE — ED Provider Notes (Signed)
[place] EMERGENCY DEPARTMENT AT [facility] Provider Note   CSN: [id number] Arrival date & time: 06/29/23  1930     History  Chief Complaint  Patient presents with   hyperkalemia    [name] is a 72 y.o. female.  HPI   This patient is a 72 year old female, she is on Eliquis, she has no history of significant renal failure, her lab work in the past is shown that she has had a creatinine between 1.2 and 1.4 pretty consistently.  She had lab work done about 6 days ago at her doctor's office after presenting for refill on her medications, they wanted to do blood work before represcribing her medications, they called and told her that her potassium was elevated and that she needed to go to the hospital, she could not find a ride until tonight.  She has no symptoms at all and is feeling otherwise well.  She does have a history of a right lower extremity amputation  Home Medications Prior to Admission medications   Medication Sig Start Date End Date Taking? Authorizing Provider  acetaminophen (TYLENOL) 500 MG tablet Take 2 tablets (1,000 mg total) by mouth every 6 (six) hours. 09/16/18   Juliet Rude, PA-C  apixaban (ELIQUIS) 5 MG TABS tablet Take 1 tablet (5 mg total) by mouth 2 (two) times daily. (NEEDS TO BE SEEN BEFORE NEXT REFILL) 06/09/23   Raliegh Ip, DO  ARTIFICIAL TEAR OP Place 1 drop into both eyes as needed (for dry eyes).    [provider]  aspirin EC 325 MG tablet Take 325 mg by mouth daily.    [provider]  diclofenac Sodium (VOLTAREN) 1 % GEL Apply 2 g topically 4 (four) times daily. Patient taking differently: Apply 2 g topically 4 (four) times daily as needed (pain). 09/03/21   Daryll Drown, NP  diltiazem (CARDIZEM CD) 240 MG 24 hr capsule Take 1 capsule (240 mg total) by mouth every morning. (NEEDS TO BE SEEN BEFORE NEXT REFILL) 06/09/23   Milian, Aleen Campi, FNP  escitalopram (LEXAPRO) 20 MG tablet Take 1 tablet (20  mg total) by mouth daily. (NEEDS TO BE SEEN BEFORE NEXT REFILL) 06/09/23   Milian, Aleen Campi, FNP  ezetimibe (ZETIA) 10 MG tablet Take 1 tablet (10 mg total) by mouth daily. (NEEDS TO BE SEEN BEFORE NEXT REFILL) 06/09/23   Milian, Aleen Campi, FNP  fenofibrate (TRICOR) 145 MG tablet Take 1 tablet (145 mg total) by mouth daily. (NEEDS TO BE SEEN BEFORE NEXT REFILL) 06/09/23   Raliegh Ip, DO  flecainide (TAMBOCOR) 50 MG tablet Take 1 tablet (50 mg total) by mouth 2 (two) times daily. (NEEDS TO BE SEEN BEFORE NEXT REFILL) 06/09/23   Delynn Flavin M, DO  hydroxyurea (HYDREA) 500 MG capsule TAKE 1 CAPSULE WITH FOOD EVERY DAY EXCEPT SUNDAYS Patient taking differently: Take 500 mg by mouth See admin instructions. Take 1 capsule with food daily except on Sundays. 09/30/22   Malachy Mood, MD  Multiple Vitamins-Minerals (CENTRUM SILVER ADULT 50+ PO) Take by mouth.    [provider]  omeprazole (PRILOSEC) 20 MG capsule TAKE ONE CAPSULE TWICE DAILY BEFORE MEALS 04/19/23   Milian, Aleen Campi, FNP  traMADol (ULTRAM) 50 MG tablet Take 1 tablet (50 mg total) by mouth every 12 (twelve) hours as needed. 06/23/23 07/23/23  Arrie Senate, FNP  triamcinolone cream (KENALOG) 0.1 % Apply 1 Application topically 2 (two) times daily. 02/09/23   Sonny Masters,  FNP  valsartan (DIOVAN) 160 MG tablet TAKE ONE TABLET EVERY EVENING (WITH 320MG  IN THE MORNING) 12/17/22   Runell Gess, MD  valsartan (DIOVAN) 320 MG tablet TAKE ONE TABLET EVERY MORNING (WITH 160MG  EVERY EVENING) 12/17/22   Runell Gess, MD      Allergies    Atenolol, Demerol  [meperidine hcl], Demerol [meperidine], Gabapentin, Inderal [propranolol], Prednisone, Statins, Warfarin and related, Crestor [rosuvastatin], Docosahexaenoic acid (dha), Lactose intolerance (gi), Lipitor [atorvastatin], Lovaza [omega-3-acid ethyl esters], Penicillins, Pravastatin, Simvastatin, Trilipix [choline fenofibrate], Warfarin, Hydralazine,  Effexor [venlafaxine hcl], Nexium [esomeprazole magnesium], Percocet [oxycodone-acetaminophen], and Plavix [clopidogrel bisulfate]    Review of Systems   Review of Systems  All other systems reviewed and are negative.   Physical Exam Updated Vital Signs BP 124/81   Pulse 72   Temp 98.4 F (36.9 C) (Oral)   Resp (!) 24   Ht 1.638 m (5' 4.5")   Wt 58.5 kg   SpO2 93%   BMI 21.80 kg/m  Physical Exam Vitals and nursing note reviewed.  Constitutional:      General: She is not in acute distress.    Appearance: She is well-developed.  HENT:     Head: Normocephalic and atraumatic.     Mouth/Throat:     Pharynx: No oropharyngeal exudate.  Eyes:     General: No scleral icterus.       Right eye: No discharge.        Left eye: No discharge.     Conjunctiva/sclera: Conjunctivae normal.     Pupils: Pupils are equal, round, and reactive to light.  Neck:     Thyroid: No thyromegaly.     Vascular: No JVD.  Cardiovascular:     Rate and Rhythm: Normal rate and regular rhythm.     Heart sounds: Normal heart sounds. No murmur heard.    No friction rub. No gallop.  Pulmonary:     Effort: Pulmonary effort is normal. No respiratory distress.     Breath sounds: Normal breath sounds. No wheezing or rales.  Abdominal:     General: Bowel sounds are normal. There is no distension.     Palpations: Abdomen is soft. There is no mass.     Tenderness: There is no abdominal tenderness.  Musculoskeletal:        General: No tenderness. Normal range of motion.     Cervical back: Normal range of motion and neck supple.     Comments: Right lower extremity with prosthesis present, left lower extremity with mild edema and is wrapped secondary to the wounds that she has which are under good control with the wound care center  Lymphadenopathy:     Cervical: No cervical adenopathy.  Skin:    General: Skin is warm and dry.     Findings: No erythema or rash.  Neurological:     Mental Status: She is alert.      Coordination: Coordination normal.  Psychiatric:        Behavior: Behavior normal.     ED Results / Procedures / Treatments   Labs (all labs ordered are listed, but only abnormal results are displayed) Labs Reviewed  BASIC METABOLIC PANEL - Abnormal; Notable for the following components:      Result Value   Potassium 5.3 (*)    CO2 18 (*)    Glucose, Bld 122 (*)    BUN 35 (*)    Creatinine, Ser 1.60 (*)    Calcium 8.2 (*)    GFR,  Estimated 34 (*)    All other components within normal limits  CBC - Abnormal; Notable for the following components:   WBC 21.8 (*)    RBC 5.45 (*)    MCH 25.1 (*)    RDW 21.6 (*)    All other components within normal limits    EKG None  Radiology No results found.  Procedures Procedures    Medications Ordered in ED Medications  sodium chloride 0.9 % bolus 1,000 mL (1,000 mLs Intravenous New Bag/Given 06/29/23 2106)    ED Course/ Medical Decision Making/ A&P                             Medical Decision Making Amount and/or Complexity of Data Reviewed Labs: ordered. ECG/medicine tests: ordered.    This patient presents to the ED for concern of hyperkalemia differential diagnosis includes renal failure, dehydration, medications, could be phlebotomy and iatrogenic    Additional history obtained:  Additional history obtained from medical record External records from outside source obtained and reviewed including prior labs over time, office visits   Lab Tests:  I Ordered, and personally interpreted labs.  The pertinent results include: Hyperkalemia is rechecked and was 5.3, there is some renal dysfunction with an acute kidney injury which is mild but likely related to some dehydration   Medicines ordered and prescription drug management:  I ordered medication including IV fluids for dehydration Reevaluation of the patient after these medicines showed that the patient improved I have reviewed the patients home medicines  and have made adjustments as needed   Problem List / ED Course:  Parents regarding potassium level, no need for aggressive treatment, no EKG findings   Social Determinants of Health:  I have discussed with the patient at the bedside the results, and the meaning of these results.  They have expressed her understanding to the need for follow-up with primary care physician           Final Clinical Impression(s) / ED Diagnoses Final diagnoses:  Dehydration  Acute kidney injury [facility])    Rx / DC Orders ED Discharge Orders     None         Eber Hong, MD 06/29/23 2121

## 2023-06-29 NOTE — ED Notes (Signed)
Pt given a sprite 

## 2023-06-29 NOTE — Progress Notes (Signed)
Patient did not present to ED from chart review. Due to Hyperkalemia recommend she present to ED asap.

## 2023-06-30 ENCOUNTER — Telehealth: Payer: Self-pay

## 2023-06-30 NOTE — Transitions of Care (Post Inpatient/ED Visit) (Signed)
06/30/2023  Name: [name] MRN: [id number] DOB: [birth year]  Today's TOC FU Call Status: Today's TOC FU Call Status:: Successful TOC FU Call Competed TOC FU Call Complete Date: 06/30/23  Transition Care Management Follow-up Telephone Call Date of Discharge: 06/29/23 [facility] Type of Discharge: Emergency Department Reason for ED Visit: Other: (dehydrated) How have you been since you were released from the hospital?: Better Any questions or concerns?: No  Items Reviewed: Did you receive and understand the discharge instructions provided?: Yes Medications obtained,verified, and reconciled?: Yes (Medications Reviewed) Any new allergies since your discharge?: No Dietary orders reviewed?: Yes Do you have support at home?: Yes People in Home: grandchild(ren)  Medications Reviewed Today: Medications Reviewed Today     Reviewed by Karena Addison, LPN (Licensed Practical Nurse) on 06/30/23 at 1158  Med List Status: <None>   Medication Order Taking? Sig Documenting Provider Last Dose Status Informant  acetaminophen (TYLENOL) 500 MG tablet [id number] No Take 2 tablets (1,000 mg total) by mouth every 6 (six) hours. Juliet Rude, PA-C Taking Active Self  apixaban (ELIQUIS) 5 MG TABS tablet [id number] No Take 1 tablet (5 mg total) by mouth 2 (two) times daily. (NEEDS TO BE SEEN BEFORE NEXT REFILL) Raliegh Ip, DO Taking Active   ARTIFICIAL TEAR OP [id number] No Place 1 drop into both eyes as needed (for dry eyes). [provider] Taking Active Self  aspirin EC 325 MG tablet [id number] No Take 325 mg by mouth daily. [provider] Taking Active Self  diclofenac Sodium (VOLTAREN) 1 % GEL [id number] No Apply 2 g topically 4 (four) times daily.  Patient taking differently: Apply 2 g topically 4 (four) times daily as needed (pain).   Daryll Drown, NP Taking Active Self  diltiazem (CARDIZEM CD) 240 MG 24 hr capsule [id number] No Take 1  capsule (240 mg total) by mouth every morning. (NEEDS TO BE SEEN BEFORE NEXT REFILL) Milian, Aleen Campi, FNP Taking Active   escitalopram (LEXAPRO) 20 MG tablet [id number] No Take 1 tablet (20 mg total) by mouth daily. (NEEDS TO BE SEEN BEFORE NEXT REFILL) Milian, Aleen Campi, FNP Taking Active   ezetimibe (ZETIA) 10 MG tablet [id number] No Take 1 tablet (10 mg total) by mouth daily. (NEEDS TO BE SEEN BEFORE NEXT REFILL) Milian, Aleen Campi, FNP Taking Active   fenofibrate (TRICOR) 145 MG tablet [id number] No Take 1 tablet (145 mg total) by mouth daily. (NEEDS TO BE SEEN BEFORE NEXT REFILL) Delynn Flavin M, DO Taking Active   flecainide (TAMBOCOR) 50 MG tablet [id number] No Take 1 tablet (50 mg total) by mouth 2 (two) times daily. (NEEDS TO BE SEEN BEFORE NEXT REFILL) Delynn Flavin M, DO Taking Active   hydroxyurea (HYDREA) 500 MG capsule [id number] No TAKE 1 CAPSULE WITH FOOD EVERY DAY EXCEPT SUNDAYS  Patient taking differently: Take 500 mg by mouth See admin instructions. Take 1 capsule with food daily except on Sundays.   Malachy Mood, MD Taking Active Self  Multiple Vitamins-Minerals (CENTRUM SILVER ADULT 50+ PO) [id number] No Take by mouth. [provider] Taking Active   omeprazole (PRILOSEC) 20 MG capsule [id number] No TAKE ONE CAPSULE TWICE DAILY BEFORE MEALS Milian, Aleen Campi, FNP Taking Active   traMADol (ULTRAM) 50 MG tablet [id number]  Take 1 tablet (50 mg total) by mouth every 12 (twelve) hours as needed. Arrie Senate, FNP  Active   triamcinolone cream (KENALOG) 0.1 % [id number] No Apply 1 Application topically 2 (two) times daily.  Sonny Masters, FNP Taking Active   valsartan (DIOVAN) 160 MG tablet [id number] No TAKE ONE TABLET EVERY EVENING (WITH 320MG  IN THE MORNING) Runell Gess, MD Taking Active   valsartan (DIOVAN) 320 MG tablet [id number] No TAKE ONE TABLET EVERY MORNING (WITH 160MG  EVERY EVENING) Runell Gess, MD Taking Active              Home Care and Equipment/Supplies: Were Home Health Services Ordered?: NA Any new equipment or medical supplies ordered?: NA  Functional Questionnaire: Do you need assistance with bathing/showering or dressing?: No Do you need assistance with meal preparation?: No Do you need assistance with eating?: No Do you have difficulty maintaining continence: No Do you need assistance with getting out of bed/getting out of a chair/moving?: No Do you have difficulty managing or taking your medications?: No  Follow up appointments reviewed: PCP Follow-up appointment confirmed?: No (declined) Specialist Hospital Follow-up appointment confirmed?: NA Do you need transportation to your follow-up appointment?: No Do you understand care options if your condition(s) worsen?: Yes-patient verbalized understanding    SIGNATURE Karena Addison, LPN [facility] Nurse Health Advisor Direct Dial [phone]

## 2023-07-02 ENCOUNTER — Encounter (HOSPITAL_BASED_OUTPATIENT_CLINIC_OR_DEPARTMENT_OTHER): Payer: 59 | Attending: General Surgery | Admitting: General Surgery

## 2023-07-02 DIAGNOSIS — Z89611 Acquired absence of right leg above knee: Secondary | ICD-10-CM | POA: Diagnosis not present

## 2023-07-02 DIAGNOSIS — E875 Hyperkalemia: Secondary | ICD-10-CM

## 2023-07-02 DIAGNOSIS — Z87891 Personal history of nicotine dependence: Secondary | ICD-10-CM | POA: Insufficient documentation

## 2023-07-02 DIAGNOSIS — Z7901 Long term (current) use of anticoagulants: Secondary | ICD-10-CM | POA: Insufficient documentation

## 2023-07-02 DIAGNOSIS — D45 Polycythemia vera: Secondary | ICD-10-CM | POA: Insufficient documentation

## 2023-07-02 DIAGNOSIS — I70248 Atherosclerosis of native arteries of left leg with ulceration of other part of lower left leg: Secondary | ICD-10-CM | POA: Diagnosis present

## 2023-07-02 DIAGNOSIS — L97822 Non-pressure chronic ulcer of other part of left lower leg with fat layer exposed: Secondary | ICD-10-CM | POA: Insufficient documentation

## 2023-07-02 DIAGNOSIS — Z8249 Family history of ischemic heart disease and other diseases of the circulatory system: Secondary | ICD-10-CM | POA: Diagnosis not present

## 2023-07-02 NOTE — Progress Notes (Signed)
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 D45 Polycythemia vera Facility Procedures : CPT4 Code: [id number] Description: 97597 - DEBRIDE WOUND 1ST 20 SQ CM OR < ICD-10 Diagnosis Description L97.822 Non-pressure chronic ulcer of other part of left lower leg with fat layer expose Modifier: d Quantity: 1 Physician Procedures : CPT4 Code Description Modifier [id number] - WC PHYS LEVEL 4 - EST PT 25 ICD-10 Diagnosis Description L97.822 Non-pressure chronic ulcer of other part of left lower leg with fat layer exposed I73.9 Peripheral vascular disease, unspecified D45  Polycythemia vera 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

## 2023-07-02 NOTE — Progress Notes (Signed)
[name], [name] ([id number]) [id number] Page 1 of 12 Visit Report for 07/02/2023 Arrival Information Details Patient Name: Date of Service: [place], [name] 07/02/2023 11:15 A M Medical Record Number: [id number] Patient Account Number: [id number] Date of Birth/Sex: Treating RN: January 08, 1951 (72 y.o. F) Primary Care Gaetan Spieker: Neale Burly Other Clinician: Referring Aydan Levitz: Treating Mithran Strike/Extender: Jolyn Lent in Treatment: 7 Visit Information History Since Last Visit All ordered tests and consults were completed: No Patient Arrived: [name] Added or deleted any medications: No Arrival Time: 11:16 Any new allergies or adverse reactions: No Accompanied By: self Had a fall or experienced change in No Transfer Assistance: None activities of daily living that may affect Patient Identification Verified: Yes risk of falls: Secondary Verification Process Completed: Yes Signs or symptoms of abuse/neglect since last visito No Patient Requires Transmission-Based Precautions: No Hospitalized since last visit: No Patient Has Alerts: No Implantable device outside of the clinic excluding No cellular tissue based products placed in the center since last visit: Pain Present Now: No Electronic Signature(s) Signed: 07/02/2023 11:32:59 AM By: Dayton Scrape Entered By: Dayton Scrape on 07/02/2023 11:16:35 -------------------------------------------------------------------------------- Encounter Discharge Information Details Patient Name: Date of Service: [name], [name] 07/02/2023 11:15 A M Medical Record Number: [id number] Patient Account Number: [id number] Date of Birth/Sex: Treating RN: 03/13/1951 (72 y.o. [name] Primary Care Deana Krock: Neale Burly Other Clinician: Referring Zionna Homewood: Treating Apurva Reily/Extender: Jolyn Lent in Treatment: 7 Encounter Discharge Information Items Post  Procedure Vitals Discharge Condition: Stable Temperature (F): 98.2 Ambulatory Status: Walker Pulse (bpm): 77 Discharge Destination: Home Respiratory Rate (breaths/min): 18 Transportation: Private Auto Blood Pressure (mmHg): 193/79 Accompanied By: self Schedule Follow-up Appointment: Yes Clinical Summary of Care: Patient Declined Electronic Signature(s) Signed: 07/02/2023 11:50:55 AM By: [name] Entered By: [name] on 07/02/2023 11:50:08 [name] ([id number]) [id number] Page 2 of 12 -------------------------------------------------------------------------------- Lower Extremity Assessment Details Patient Name: Date of Service: [place], [name] 07/02/2023 11:15 A M Medical Record Number: [id number] Patient Account Number: [id number] Date of Birth/Sex: Treating RN: [birth year] (72 y.o. [name] Primary Care Amarea Macdowell: Neale Burly Other Clinician: Referring Ariona Deschene: Treating Molley Houser/Extender: Jolyn Lent in Treatment: 7 Edema Assessment Assessed: [Left: No] [Right: No] Edema: [Left: N] [Right: o] Calf Left: Right: Point of Measurement: From Medial Instep 30.5 cm Ankle Left: Right: Point of Measurement: From Medial Instep 17.5 cm Vascular Assessment Pulses: Dorsalis Pedis Palpable: [Left:Yes] Extremity colors, hair growth, and conditions: Extremity Color: [Left:Normal] Temperature of Extremity: [Left:Warm] Capillary Refill: [Left:< 3 seconds] Dependent Rubor: [Left:No] Blanched when Elevated: [Left:No No] Electronic Signature(s) Signed: 07/02/2023 11:50:55 AM By: [name] Entered By: [name] on 07/02/2023 11:25:43 -------------------------------------------------------------------------------- Multi Wound Chart Details Patient Name: Date of Service: Stacey Livingston [name], [name] 07/02/2023 11:15 A M Medical Record Number: [id number] Patient Account Number:  [id number] Date of Birth/Sex: Treating RN: May 13, 1951 (72 y.o. F) Primary Care Raphaela Cannaday: Neale Burly Other Clinician: Referring Avantika Shere: Treating Kiyoto Slomski/Extender: Jolyn Lent in Treatment: 7 Vital Signs Height(in): 64 Pulse(bpm): 77 Weight(lbs): 138 Blood Pressure(mmHg): 193/79 Body Mass Index(BMI): 23.7 Temperature(F): 98.2 Respiratory Rate(breaths/min): 18 [19:Photos:] [id number] Page 3 of 12] Left, Posterior Lower Leg Left, Lateral Lower Leg Left, Proximal, Lateral Lower Leg Wound Location: Blister Gradually Appeared Blister Wounding Event: Venous Leg Ulcer Venous Leg Ulcer Venous Leg Ulcer Primary Etiology: Glaucoma, Arrhythmia, Hypertension, Glaucoma, Arrhythmia, Hypertension, Glaucoma, Arrhythmia, Hypertension, Comorbid History: Peripheral Arterial Disease, Peripheral Peripheral Arterial Disease, Peripheral Peripheral Arterial Disease, Peripheral  Venous Disease, Gout, Osteoarthritis Venous Disease, Gout, Osteoarthritis Venous Disease, Gout, Osteoarthritis 02/19/2023 05/21/2023 05/22/2023 Date Acquired: 7 6 5  Weeks of Treatment: Open Open Open Wound Status: No No No Wound Recurrence: Yes No No Clustered Wound: 1.8x1x0.1 1.1x0.5x0.1 1.1x0.7x0.1 Measurements L x W x D (cm) 1.414 0.432 0.605 A (cm) : rea 0.141 0.043 0.06 Volume (cm) : 94.80% 39.60% 93.60% % Reduction in A rea: 94.80% 39.40% 93.60% % Reduction in Volume: Full Thickness Without Exposed Full Thickness Without Exposed Full Thickness Without Exposed Classification: Support Structures Support Structures Support Structures Medium Medium Medium Exudate A mount: Serosanguineous Serosanguineous Serosanguineous Exudate Type: red, brown red, brown red, brown Exudate Color: Distinct, outline attached Flat and Intact Distinct, outline attached Wound Margin: Large (67-100%) Large (67-100%) Small (1-33%) Granulation A mount: Red, Pink Red  Red Granulation Quality: Small (1-33%) Small (1-33%) Large (67-100%) Necrotic A mount: Fat Layer (Subcutaneous Tissue): Yes Fat Layer (Subcutaneous Tissue): Yes Fat Layer (Subcutaneous Tissue): Yes Exposed Structures: Fascia: No Fascia: No Fascia: No Tendon: No Tendon: No Tendon: No Muscle: No Muscle: No Muscle: No Joint: No Joint: No Joint: No Bone: No Bone: No Bone: No Small (1-33%) Small (1-33%) Small (1-33%) Epithelialization: Debridement - Selective/Open Wound Debridement - Selective/Open Wound Debridement - Selective/Open Wound Debridement: Pre-procedure Verification/Time Out 11:34 11:34 11:34 Taken: Lidocaine 5% topical ointment Lidocaine 5% topical ointment Lidocaine 5% topical ointment Pain Control: [employer] Tissue Debrided: Non-Viable Tissue Non-Viable Tissue Non-Viable Tissue Level: 1.41 0.43 0.6 Debridement A (sq cm): rea Curette Curette Curette Instrument: Minimum Minimum Minimum Bleeding: Pressure Pressure Pressure Hemostasis A chieved: Procedure was tolerated well Procedure was tolerated well Procedure was tolerated well Debridement Treatment Response: 1.8x1x0.1 1.1x0.5x0.1 1.1x0.7x0.1 Post Debridement Measurements L x W x D (cm) 0.141 0.043 0.06 Post Debridement Volume: (cm) Scarring: Yes No Abnormalities Noted Scarring: No Periwound Skin Texture: Maceration: No No Abnormalities Noted Maceration: No Periwound Skin Moisture: Dry/Scaly: No Dry/Scaly: No Hemosiderin Staining: Yes Hemosiderin Staining: Yes Hemosiderin Staining: Yes Periwound Skin Color: No Abnormality No Abnormality No Abnormality Temperature: Yes Yes Yes Tenderness on Palpation: Debridement Debridement Debridement Procedures Performed: Treatment Notes Wound #19 (Lower Leg) Wound Laterality: Left, Posterior Cleanser Vashe 5.8 (oz) Discharge Instruction: Cleanse the wound with Vashe prior to applying a clean dressing using gauze sponges, not tissue or  cotton balls. Peri-Wound Care Triamcinolone 15 (g) Discharge Instruction: Use triamcinolone 15 (g) as directed Sween Lotion (Moisturizing lotion) Discharge Instruction: Apply moisturizing lotion as directed Topical Primary Dressing Maxorb Extra Ag+ Alginate Dressing, 4x4.75 (in/in) Discharge Instruction: Apply to wound bed as instructed [name], [name] ([id number]) [id number] Page 4 of 12 Secondary Dressing ABD Pad, 8x10 Discharge Instruction: Apply over primary dressing as directed. Secured With Compression Wrap Kerlix Roll 4.5x3.1 (in/yd) Discharge Instruction: Apply Kerlix and Coban compression as directed. Coban Self-Adherent Wrap 4x5 (in/yd) Discharge Instruction: Apply over Kerlix as directed. Compression Stockings Add-Ons Wound #21 (Lower Leg) Wound Laterality: Left, Lateral Cleanser Vashe 5.8 (oz) Discharge Instruction: Cleanse the wound with Vashe prior to applying a clean dressing using gauze sponges, not tissue or cotton balls. Peri-Wound Care Triamcinolone 15 (g) Discharge Instruction: Use triamcinolone 15 (g) as directed Sween Lotion (Moisturizing lotion) Discharge Instruction: Apply moisturizing lotion as directed Topical Gentamicin Discharge Instruction: As directed by physician Mupirocin Ointment Discharge Instruction: Apply Mupirocin (Bactroban) as instructed Primary Dressing Maxorb Extra Ag+ Alginate Dressing, 4x4.75 (in/in) Discharge Instruction: Apply to wound bed as instructed Secondary Dressing ABD Pad, 8x10 Discharge Instruction: Apply over primary dressing as directed. Secured With Compression Wrap  Kerlix Roll 4.5x3.1 (in/yd) Discharge Instruction: Apply Kerlix and Coban compression as directed. Coban Self-Adherent Wrap 4x5 (in/yd) Discharge Instruction: Apply over Kerlix as directed. Compression Stockings Add-Ons Wound #22 (Lower Leg) Wound Laterality: Left, Lateral, Proximal Cleanser Vashe 5.8 (oz) Discharge  Instruction: Cleanse the wound with Vashe prior to applying a clean dressing using gauze sponges, not tissue or cotton balls. Peri-Wound Care Triamcinolone 15 (g) Discharge Instruction: Use triamcinolone 15 (g) as directed Sween Lotion (Moisturizing lotion) Discharge Instruction: Apply moisturizing lotion as directed Topical Gentamicin Discharge Instruction: As directed by physician Mupirocin Ointment Discharge Instruction: Apply Mupirocin (Bactroban) as instructed [name], [name] ([id number]) [id number] Page 5 of 12 Primary Dressing Maxorb Extra Ag+ Alginate Dressing, 4x4.75 (in/in) Discharge Instruction: Apply to wound bed as instructed Secondary Dressing ABD Pad, 8x10 Discharge Instruction: Apply over primary dressing as directed. Secured With Compression Wrap Kerlix Roll 4.5x3.1 (in/yd) Discharge Instruction: Apply Kerlix and Coban compression as directed. Coban Self-Adherent Wrap 4x5 (in/yd) Discharge Instruction: Apply over Kerlix as directed. Compression Stockings Add-Ons Electronic Signature(s) Signed: 07/02/2023 11:52:48 AM By: Duanne Guess MD FACS Entered By: Duanne Guess on 07/02/2023 11:52:47 -------------------------------------------------------------------------------- Multi-Disciplinary Care Plan Details Patient Name: Date of Service: [place]acey [place], [name] 07/02/2023 11:15 A M Medical Record Number: [id number] Patient Account Number: [id number] Date of Birth/Sex: Treating RN: 02/28/1951 (72 y.o. [name] Primary Care Winola Drum: Neale Burly Other Clinician: Referring Sabrena Gavitt: Treating Zohair Epp/Extender: Jolyn Lent in Treatment: 7 Multidisciplinary Care Plan reviewed with physician Active Inactive Venous Leg Ulcer Nursing Diagnoses: Knowledge deficit related to disease process and management Potential for venous Insuffiency (use before diagnosis confirmed) Goals: Patient will  maintain optimal edema control Date Initiated: 05/21/2023 Target Resolution Date: 07/23/2023 Goal Status: Active Interventions: Assess peripheral edema status every visit. Compression as ordered Provide education on venous insufficiency Treatment Activities: Therapeutic compression applied : 05/21/2023 Notes: Wound/Skin Impairment Nursing Diagnoses: Impaired tissue integrity Goals: Patient/caregiver will verbalize understanding of skin care regimen ANADELIA, MAXHAM [name] ([id number]) [id number] Page 6 of 12 Date Initiated: 05/13/2023 Target Resolution Date: 07/23/2023 Goal Status: Active Interventions: Assess patient/caregiver ability to obtain necessary supplies Assess patient/caregiver ability to perform ulcer/skin care regimen upon admission and as needed Assess ulceration(s) every visit Provide education on ulcer and skin care Screen for HBO Treatment Activities: Skin care regimen initiated : 05/13/2023 Topical wound management initiated : 05/13/2023 Notes: Electronic Signature(s) Signed: 07/02/2023 11:50:55 AM By: [name] Entered By: [name] on 07/02/2023 11:30:40 -------------------------------------------------------------------------------- Pain Assessment Details Patient Name: Date of Service: [name], [name] 07/02/2023 11:15 A M Medical Record Number: [id number] Patient Account Number: [id number] Date of Birth/Sex: Treating RN: 1951-02-24 (72 y.o. F) Primary Care Lillia Lengel: Neale Burly Other Clinician: Referring Darick Fetters: Treating Chastidy Ranker/Extender: Jolyn Lent in Treatment: 7 Active Problems Location of Pain Severity and Description of Pain Patient Has Paino No Site Locations Pain Management and Medication Current Pain Management: Electronic Signature(s) Signed: 07/02/2023 11:32:59 AM By: Dayton Scrape Entered By: Dayton Scrape on 07/02/2023 11:17:13 [name] ([id number])  [id number] Page 7 of 12 -------------------------------------------------------------------------------- Patient/Caregiver Education Details Patient Name: Date of Service: [name], [name] 7/12/2024andnbsp11:15 A M Medical Record Number: [id number] Patient Account Number: [id number] Date of Birth/Gender: Treating RN: Apr 24, 1951 (72 y.o. [name] Primary Care Physician: Neale Burly Other Clinician: Referring Physician: Treating Physician/Extender: Jolyn Lent in Treatment: 7 Education Assessment Education Provided To: Patient Education Topics Provided Wound/Skin Impairment: Methods: Explain/Verbal Responses: Reinforcements needed, State content correctly [name])  Signed: 07/02/2023 11:50:55 AM By: Gelene Mink By: [name] on 07/02/2023 11:30:51 -------------------------------------------------------------------------------- Wound Assessment Details Patient Name: Date of Service: [name], [name] 07/02/2023 11:15 A M Medical Record Number: [id number] Patient Account Number: [id number] Date of Birth/Sex: Treating RN: 15-Sep-1951 (72 y.o. F) Primary Care Gaynor Ferreras: Neale Burly Other Clinician: Referring Ceniyah Thorp: Treating Aivah Putman/Extender: Jolyn Lent in Treatment: 7 Wound Status Wound Number: 19 Primary Venous Leg Ulcer Etiology: Wound Location: Left, Posterior Lower Leg Wound Open Wounding Event: Blister Status: Date Acquired: 02/19/2023 Comorbid Glaucoma, Arrhythmia, Hypertension, Peripheral Arterial Disease, Weeks Of Treatment: 7 History: Peripheral Venous Disease, Gout, Osteoarthritis Clustered Wound: Yes Photos Wound Measurements Length: (cm) 1.8 Width: (cm) 1 Goody, Talisa [name] ([id number]) Depth: (cm) 0.1 Area: (cm) 1.414 Volume: (cm) 0.141 % Reduction in Area: 94.8% % Reduction in Volume:  94.8% [id number] Page 8 of 12 Epithelialization: Small (1-33%) Tunneling: No Undermining: No Wound Description Classification: Full Thickness Without Exposed Support Structures Wound Margin: Distinct, outline attached Exudate Amount: Medium Exudate Type: Serosanguineous Exudate Color: red, brown Foul Odor After Cleansing: No Slough/Fibrino Yes Wound Bed Granulation Amount: Large (67-100%) Exposed Structure Granulation Quality: Red, Pink Fascia Exposed: No Necrotic Amount: Small (1-33%) Fat Layer (Subcutaneous Tissue) Exposed: Yes Necrotic Quality: Adherent Slough Tendon Exposed: No Muscle Exposed: No Joint Exposed: No Bone Exposed: No Periwound Skin Texture Texture Color No Abnormalities Noted: Yes No Abnormalities Noted: No Hemosiderin Staining: Yes Moisture No Abnormalities Noted: No Temperature / Pain Dry / Scaly: No Temperature: No Abnormality Maceration: No Tenderness on Palpation: Yes Treatment Notes Wound #19 (Lower Leg) Wound Laterality: Left, Posterior Cleanser Vashe 5.8 (oz) Discharge Instruction: Cleanse the wound with Vashe prior to applying a clean dressing using gauze sponges, not tissue or cotton balls. Peri-Wound Care Triamcinolone 15 (g) Discharge Instruction: Use triamcinolone 15 (g) as directed Sween Lotion (Moisturizing lotion) Discharge Instruction: Apply moisturizing lotion as directed Topical Primary Dressing Maxorb Extra Ag+ Alginate Dressing, 4x4.75 (in/in) Discharge Instruction: Apply to wound bed as instructed Secondary Dressing ABD Pad, 8x10 Discharge Instruction: Apply over primary dressing as directed. Secured With Compression Wrap Kerlix Roll 4.5x3.1 (in/yd) Discharge Instruction: Apply Kerlix and Coban compression as directed. Coban Self-Adherent Wrap 4x5 (in/yd) Discharge Instruction: Apply over Kerlix as directed. Compression Stockings Add-Ons Electronic Signature(s) Signed: 07/02/2023 11:50:55 AM  By: [name] Entered By: [name] on 07/02/2023 11:28:05 [name] ([id number]) [id number] Page 9 of 12 -------------------------------------------------------------------------------- Wound Assessment Details Patient Name: Date of Service: [name], [name] 07/02/2023 11:15 A M Medical Record Number: [id number] Patient Account Number: [id number] Date of Birth/Sex: Treating RN: 04-19-1951 (72 y.o. F) Primary Care Ravleen Ries: Neale Burly Other Clinician: Referring Shyia Fillingim: Treating Lillyanna Glandon/Extender: Jolyn Lent in Treatment: 7 Wound Status Wound Number: 21 Primary Venous Leg Ulcer Etiology: Wound Location: Left, Lateral Lower Leg Wound Open Wounding Event: Gradually Appeared Status: Date Acquired: 05/21/2023 Comorbid Glaucoma, Arrhythmia, Hypertension, Peripheral Arterial Disease, Weeks Of Treatment: 6 History: Peripheral Venous Disease, Gout, Osteoarthritis Clustered Wound: No Photos Wound Measurements Length: (cm) 1.1 Width: (cm) 0.5 Depth: (cm) 0.1 Area: (cm) 0.432 Volume: (cm) 0.043 % Reduction in Area: 39.6% % Reduction in Volume: 39.4% Epithelialization: Small (1-33%) Tunneling: No Undermining: No Wound Description Classification: Full Thickness Without Exposed Suppor Wound Margin: Flat and Intact Exudate Amount: Medium Exudate Type: Serosanguineous Exudate Color: red, brown t Structures Foul Odor After Cleansing: No Slough/Fibrino Yes Wound Bed Granulation Amount: Large (67-100%) Exposed Structure Granulation Quality: Red Fascia Exposed: No Necrotic Amount: Small (1-33%)  Fat Layer (Subcutaneous Tissue) Exposed: Yes Necrotic Quality: Adherent Slough Tendon Exposed: No Muscle Exposed: No Joint Exposed: No Bone Exposed: No Periwound Skin Texture Texture Color No Abnormalities Noted: Yes No Abnormalities Noted: No Hemosiderin Staining: Yes Moisture No Abnormalities  Noted: Yes Temperature / Pain Temperature: No Abnormality Tenderness on Palpation: Yes Treatment Notes Wound #21 (Lower Leg) Wound Laterality: Left, Lateral Cleanser [name], [name] ([id number]) [id number] Page 10 of 12 Vashe 5.8 (oz) Discharge Instruction: Cleanse the wound with Vashe prior to applying a clean dressing using gauze sponges, not tissue or cotton balls. Peri-Wound Care Triamcinolone 15 (g) Discharge Instruction: Use triamcinolone 15 (g) as directed Sween Lotion (Moisturizing lotion) Discharge Instruction: Apply moisturizing lotion as directed Topical Gentamicin Discharge Instruction: As directed by physician Mupirocin Ointment Discharge Instruction: Apply Mupirocin (Bactroban) as instructed Primary Dressing Maxorb Extra Ag+ Alginate Dressing, 4x4.75 (in/in) Discharge Instruction: Apply to wound bed as instructed Secondary Dressing ABD Pad, 8x10 Discharge Instruction: Apply over primary dressing as directed. Secured With Compression Wrap Kerlix Roll 4.5x3.1 (in/yd) Discharge Instruction: Apply Kerlix and Coban compression as directed. Coban Self-Adherent Wrap 4x5 (in/yd) Discharge Instruction: Apply over Kerlix as directed. Compression Stockings Add-Ons Electronic Signature(s) Signed: 07/02/2023 11:50:55 AM By: [name] Entered By: [name] on 07/02/2023 11:28:26 -------------------------------------------------------------------------------- Wound Assessment Details Patient Name: Date of Service: [name], [name] 07/02/2023 11:15 A M Medical Record Number: [id number] Patient Account Number: [id number] Date of Birth/Sex: Treating RN: [birth year] (72 y.o. F) Primary Care Ariaunna Longsworth: Neale Burly Other Clinician: Referring Alyxander Kollmann: Treating Myka Lukins/Extender: Jolyn Lent in Treatment: 7 Wound Status Wound Number: 22 Primary Venous Leg Ulcer Etiology: Wound Location: Left,  Proximal, Lateral Lower Leg Wound Open Wounding Event: Blister Status: Date Acquired: 05/22/2023 Comorbid Glaucoma, Arrhythmia, Hypertension, Peripheral Arterial Disease, Weeks Of Treatment: 5 History: Peripheral Venous Disease, Gout, Osteoarthritis Clustered Wound: No Photos [name], [name] ([id number]) [id number] Page 11 of 12 Wound Measurements Length: (cm) 1.1 Width: (cm) 0.7 Depth: (cm) 0.1 Area: (cm) 0.605 Volume: (cm) 0.06 % Reduction in Area: 93.6% % Reduction in Volume: 93.6% Epithelialization: Small (1-33%) Tunneling: No Undermining: No Wound Description Classification: Full Thickness Without Exposed Support Structures Wound Margin: Distinct, outline attached Exudate Amount: Medium Exudate Type: Serosanguineous Exudate Color: red, brown Foul Odor After Cleansing: No Slough/Fibrino Yes Wound Bed Granulation Amount: Small (1-33%) Exposed Structure Granulation Quality: Red Fascia Exposed: No Necrotic Amount: Large (67-100%) Fat Layer (Subcutaneous Tissue) Exposed: Yes Necrotic Quality: Adherent Slough Tendon Exposed: No Muscle Exposed: No Joint Exposed: No Bone Exposed: No Periwound Skin Texture Texture Color No Abnormalities Noted: Yes No Abnormalities Noted: No Hemosiderin Staining: Yes Moisture No Abnormalities Noted: No Temperature / Pain Dry / Scaly: No Temperature: No Abnormality Maceration: No Tenderness on Palpation: Yes Treatment Notes Wound #22 (Lower Leg) Wound Laterality: Left, Lateral, Proximal Cleanser Vashe 5.8 (oz) Discharge Instruction: Cleanse the wound with Vashe prior to applying a clean dressing using gauze sponges, not tissue or cotton balls. Peri-Wound Care Triamcinolone 15 (g) Discharge Instruction: Use triamcinolone 15 (g) as directed Sween Lotion (Moisturizing lotion) Discharge Instruction: Apply moisturizing lotion as directed Topical Gentamicin Discharge Instruction: As directed by  physician Mupirocin Ointment Discharge Instruction: Apply Mupirocin (Bactroban) as instructed Primary Dressing Maxorb Extra Ag+ Alginate Dressing, 4x4.75 (in/in) Discharge Instruction: Apply to wound bed as instructed Secondary Dressing ABD Pad, 8x10 Discharge Instruction: Apply over primary dressing as directed. [name], [name] ([id number]) [id number] Page 12 of 12 Secured With Compression Wrap Kerlix Roll 4.5x3.1 (in/yd) Discharge Instruction: Apply Kerlix and  Coban compression as directed. Coban Self-Adherent Wrap 4x5 (in/yd) Discharge Instruction: Apply over Kerlix as directed. Compression Stockings Add-Ons Electronic Signature(s) Signed: 07/02/2023 11:50:55 AM By: [name] Entered By: [name] on 07/02/2023 11:28:44 -------------------------------------------------------------------------------- Vitals Details Patient Name: Date of Service: Stacey Livingston [name], [name] 07/02/2023 11:15 A M Medical Record Number: [id number] Patient Account Number: [id number] Date of Birth/Sex: Treating RN: 1951/01/17 (72 y.o. F) Primary Care Deronte Solis: Neale Burly Other Clinician: Referring Moriah Loughry: Treating Yudith Norlander/Extender: Jolyn Lent in Treatment: 7 Vital Signs Time Taken: 11:16 Temperature (F): 98.2 Height (in): 64 Pulse (bpm): 77 Weight (lbs): 138 Respiratory Rate (breaths/min): 18 Body Mass Index (BMI): 23.7 Blood Pressure (mmHg): 193/79 Reference Range: 80 - 120 mg / dl Electronic Signature(s) Signed: 07/02/2023 11:32:59 AM By: Dayton Scrape Entered By: Dayton Scrape on 07/02/2023 11:17:03

## 2023-07-07 NOTE — Addendum Note (Signed)
Addended by: Neale Burly on: 07/07/2023 01:37 PM   Modules accepted: Orders

## 2023-07-09 ENCOUNTER — Other Ambulatory Visit: Payer: Self-pay | Admitting: Family Medicine

## 2023-07-09 ENCOUNTER — Encounter (HOSPITAL_BASED_OUTPATIENT_CLINIC_OR_DEPARTMENT_OTHER): Payer: 59 | Admitting: General Surgery

## 2023-07-09 DIAGNOSIS — I70248 Atherosclerosis of native arteries of left leg with ulceration of other part of lower left leg: Secondary | ICD-10-CM | POA: Diagnosis not present

## 2023-07-09 DIAGNOSIS — Z8673 Personal history of transient ischemic attack (TIA), and cerebral infarction without residual deficits: Secondary | ICD-10-CM

## 2023-07-09 DIAGNOSIS — I214 Non-ST elevation (NSTEMI) myocardial infarction: Secondary | ICD-10-CM

## 2023-07-09 DIAGNOSIS — I10 Essential (primary) hypertension: Secondary | ICD-10-CM

## 2023-07-14 ENCOUNTER — Other Ambulatory Visit: Payer: Self-pay

## 2023-07-14 ENCOUNTER — Inpatient Hospital Stay: Payer: 59 | Attending: Hematology

## 2023-07-14 ENCOUNTER — Inpatient Hospital Stay: Payer: 59

## 2023-07-14 VITALS — BP 150/64 | HR 68 | Resp 20

## 2023-07-14 DIAGNOSIS — D509 Iron deficiency anemia, unspecified: Secondary | ICD-10-CM | POA: Insufficient documentation

## 2023-07-14 DIAGNOSIS — D45 Polycythemia vera: Secondary | ICD-10-CM | POA: Diagnosis not present

## 2023-07-14 LAB — CBC WITH DIFFERENTIAL (CANCER CENTER ONLY)
Abs Immature Granulocytes: 1.18 10*3/uL — ABNORMAL HIGH (ref 0.00–0.07)
Basophils Absolute: 0.4 10*3/uL — ABNORMAL HIGH (ref 0.0–0.1)
Basophils Relative: 2 %
Eosinophils Absolute: 0.7 10*3/uL — ABNORMAL HIGH (ref 0.0–0.5)
Eosinophils Relative: 3 %
HCT: 45.3 % (ref 36.0–46.0)
Hemoglobin: 13.9 g/dL (ref 12.0–15.0)
Immature Granulocytes: 5 %
Lymphocytes Relative: 5 %
Lymphs Abs: 1.4 10*3/uL (ref 0.7–4.0)
MCH: 24.4 pg — ABNORMAL LOW (ref 26.0–34.0)
MCHC: 30.7 g/dL (ref 30.0–36.0)
MCV: 79.6 fL — ABNORMAL LOW (ref 80.0–100.0)
Monocytes Absolute: 0.7 10*3/uL (ref 0.1–1.0)
Monocytes Relative: 3 %
Neutro Abs: 21.3 10*3/uL — ABNORMAL HIGH (ref 1.7–7.7)
Neutrophils Relative %: 82 %
Platelet Count: 325 10*3/uL (ref 150–400)
RBC: 5.69 MIL/uL — ABNORMAL HIGH (ref 3.87–5.11)
RDW: 19.5 % — ABNORMAL HIGH (ref 11.5–15.5)
WBC Count: 25.7 10*3/uL — ABNORMAL HIGH (ref 4.0–10.5)
nRBC: 0.4 % — ABNORMAL HIGH (ref 0.0–0.2)

## 2023-07-14 NOTE — Progress Notes (Signed)
[name] presents today for phlebotomy per MD orders. Phlebotomy procedure started at 1523 and ended at 1533. 507 grams removed via 16G LAC.  Patient observed for 30 minutes after procedure without any incident. Patient tolerated procedure well. Ambulatory to lobby with walker.  IV needle removed intact.

## 2023-07-14 NOTE — Progress Notes (Addendum)
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 -------------------------------------------------------------------------------- SuperBill Details Patient Name: Date of Service: [place], [name] 07/09/2023 Medical Record Number: [id number] Patient Account Number: [id number] Date of Birth/Sex: Treating RN: 12/25/1950 (72 y.o. [name] Primary Care Provider: Neale Burly Other Clinician: Referring Provider: Treating Provider/Extender: Jolyn Lent in Treatment: 8 Diagnosis Coding ICD-10 Codes Code Description [phone] Non-pressure chronic ulcer of other part of left lower leg with fat layer exposed I73.9 Peripheral vascular disease, unspecified Z89.611 Acquired absence of right leg above knee D45 Polycythemia vera Facility Procedures : CPT4 Code: [id number] Description: 97597 - DEBRIDE WOUND 1ST 20 SQ CM OR < ICD-10 Diagnosis Description L97.822 Non-pressure chronic ulcer of other part of left lower leg with fat layer expose Modifier: d Quantity: 1 Physician Procedures : CPT4 Code Description Modifier [id number] - WC PHYS LEVEL 4 - EST  PT 25 ICD-10 Diagnosis Description L97.822 Non-pressure chronic ulcer of other part of left lower leg with fat layer exposed I73.9 Peripheral vascular  disease, unspecified Z89.611  Acquired absence of right leg above knee D45 Polycythemia vera Quantity: 1 : [id number] 97597 - WC PHYS DEBR WO ANESTH 20 SQ CM ICD-10 Diagnosis Description L97.822 Non-pressure chronic ulcer of other part of left lower leg with fat layer exposed Quantity: 1 Electronic Signature(s) Signed: 07/19/2023 9:24:05 AM By: Duanne Guess MD FACS Previous Signature: 07/12/2023 12:35:38 PM Version By: Duanne Guess MD FACS Entered By: Duanne Guess on 07/19/2023 09:24:05

## 2023-07-14 NOTE — Progress Notes (Addendum)
[name] ([id number]) [id number] Page 1 of 10 Visit Report for 07/09/2023 Arrival Information Details Patient Name: Date of Service: [place], [name] 07/09/2023 11:30 A M Medical Record Number: [id number] Patient Account Number: [id number] Date of Birth/Sex: Treating RN: 07-Jan-1951 (72 y.o. [name] Primary Care [name]: [name] Other Clinician: Referring [name]: Treating [name]/Extender: [name] in Treatment: 8 Visit Information History Since Last Visit All ordered tests and consults were completed: Yes Patient Arrived: [name] Added or deleted any medications: No Arrival Time: 12:18 Any new allergies or adverse reactions: No Accompanied By: self Had a fall or experienced change in No Transfer Assistance: None activities of daily living that may affect Patient Identification Verified: Yes risk of falls: Secondary Verification Process Completed: Yes Signs or symptoms of abuse/neglect since last visito No Patient Requires Transmission-Based Precautions: No Hospitalized since last visit: No Patient Has Alerts: No Implantable device outside of the clinic excluding No cellular tissue based products placed in the center since last visit: Pain Present Now: No Electronic Signature(s) Signed: 07/14/2023 11:11:00 AM By: Brenton Grills Entered By: Brenton Grills on 07/12/2023 12:21:05 -------------------------------------------------------------------------------- Encounter Discharge Information Details Patient Name: Date of Service: [name], [place] Stacey B. 07/09/2023 11:30 A M Medical Record Number: [id number] Patient Account Number: [id number] Date of Birth/Sex: Treating RN: 29-Aug-1951 (72 y.o. [name] Primary Care [name]: [name] Other Clinician: Referring [name]: Treating [name]/Extender: [name] in Treatment: 8 Encounter Discharge  Information Items Post Procedure Vitals Discharge Condition: Stable Temperature (F): 98.1 Ambulatory Status: Walker Pulse (bpm): 80 Discharge Destination: Home Respiratory Rate (breaths/min): 18 Transportation: Private Auto Blood Pressure (mmHg): 136/80 Accompanied By: self Schedule Follow-up Appointment: Yes Clinical Summary of Care: Patient Declined Electronic Signature(s) Signed: 07/14/2023 11:11:00 AM By: Brenton Grills Entered By: Brenton Grills on 07/12/2023 12:31:15 [name] ([id number]) [id number] Page 2 of 10 -------------------------------------------------------------------------------- Lower Extremity Assessment Details Patient Name: Date of Service: [place], [name] 07/09/2023 11:30 A M Medical Record Number: [id number] Patient Account Number: [id number] Date of Birth/Sex: Treating RN: [birth year] (72 y.o. [name] Primary Care Trinidee Schrag: [name] Other Clinician: Referring [name]: Treating [name]/Extender: [name] in Treatment: 8 Edema Assessment Assessed: [Left: No] [Right: No] Edema: [Left: N] [Right: o] Calf Left: Right: Point of Measurement: From Medial Instep 30.5 cm Ankle Left: Right: Point of Measurement: From Medial Instep 17.5 cm Vascular Assessment Pulses: Dorsalis Pedis Palpable: [Left:Yes] Extremity colors, hair growth, and conditions: Extremity Color: [Left:Normal] Temperature of Extremity: [Left:Warm] Capillary Refill: [Left:< 3 seconds] Dependent Rubor: [Left:No No] Toe Nail Assessment Left: Right: Thick: Yes Discolored: Yes Deformed: No Improper Length and Hygiene: Yes Electronic Signature(s) Signed: 07/14/2023 11:11:00 AM By: Brenton Grills Entered By: Brenton Grills on 07/12/2023 12:22:13 -------------------------------------------------------------------------------- Multi Wound Chart Details Patient Name: Date of Service: [name], [place] Stacey B.  07/09/2023 11:30 A M Medical Record Number: [id number] Patient Account Number: [id number] Date of Birth/Sex: Treating RN: 1951/10/10 (72 y.o. F) Primary Care [name]: [name] Other Clinician: Referring [name]: Treating [name]/Extender: [name] in Treatment: 8 Vital Signs Height(in): 64 Pulse(bpm): 81 Weight(lbs): 138 Blood Pressure(mmHg): 107/69 Body Mass Index(BMI): 23.7 Temperature(F): 98.3 Seeman, [name] ([id number]) [id number] Page 3 of 10 Respiratory Rate(breaths/min): 18 [19:Photos: No Photos Left, Posterior Lower Leg Wound Location: Blister Wounding Event: Venous Leg Ulcer Primary Etiology: Glaucoma, Arrhythmia, Hypertension, Glaucoma, Arrhythmia, Hypertension, Glaucoma, Arrhythmia, Hypertension, Comorbid History: Peripheral  Arterial Disease, Peripheral Peripheral Arterial Disease, Peripheral Peripheral  Arterial Disease, Peripheral Venous Disease, Gout, Osteoarthritis Venous Disease, Gout, Osteoarthritis Venous Disease, Gout, Osteoarthritis 02/19/2023 Date Acquired: 8 Weeks of  Treatment: Open Wound Status: No Wound Recurrence: Yes Clustered Wound: 1.5x0.9x0.1 Measurements L x W x D (cm) 1.06 A (cm) : rea 0.106 Volume (cm) : 96.10% % Reduction in A rea: 96.10% % Reduction in Volume: Full Thickness Without Exposed  Classification: Support Structures Medium Exudate A mount: Serosanguineous Exudate Type: red, brown Exudate Color: Distinct, outline attached Wound Margin: Large (67-100%) Granulation A mount: Red, Pink Granulation Quality: Small (1-33%) Necrotic A  mount: Fat Layer (Subcutaneous Tissue): Yes Fat Layer (Subcutaneous Tissue): Yes Fat Layer (Subcutaneous Tissue): Yes Exposed Structures: Fascia: No Tendon: No Muscle: No Joint: No Bone: No Small (1-33%) Epithelialization: Debridement - Selective/Open  Wound Debridement - Selective/Open Wound Debridement - Selective/Open Wound Debridement: Pre-procedure  Verification/Time Out 11:08 Taken: Lidocaine 4% Topical Solution Pain Control: Necrotic/Eschar, Slough Tissue Debrided: Non-Viable Tissue Level: 1.06  Debridement A (sq cm): rea Curette Instrument: Minimum Bleeding: Pressure Hemostasis A chieved: 0 Procedural Pain: 0 Post Procedural Pain: Procedure was tolerated well Debridement Treatment Response: 1.5x0.9x0.1 Post Debridement Measurements L x W x D  (cm) 0.106 Post Debridement Volume: (cm) Scarring: Yes Periwound Skin Texture: Maceration: No Periwound Skin Moisture: Dry/Scaly: No Hemosiderin Staining: Yes Periwound Skin Color: No Abnormality Temperature: Yes Tenderness on Palpation: Debridement  Procedures Performed:] [21:No Photos Left, Lateral Lower Leg Gradually Appeared Venous Leg Ulcer 05/21/2023 7 Open No No 1.3x1.1x0.1 1.123 0.112 -57.10% -57.70% Full Thickness Without Exposed Support Structures Medium Serosanguineous red, brown Flat and  Intact Large (67-100%) Red Small (1-33%) Fascia: No Tendon: No Muscle: No Joint: No Bone: No Small (1-33%) 11:08 Lidocaine 4% Topical Solution Necrotic/Eschar Non-Viable Tissue 1.12 Curette Minimum Pressure 0 0 Procedure was tolerated well 1.3x1.1x0.1  0.112 No Abnormalities Noted No Abnormalities Noted Hemosiderin Staining: Yes No Abnormality Yes Debridement] [22:No Photos Left, Proximal, Lateral Lower Leg Blister Venous Leg Ulcer 05/22/2023 6 Open No No 0.8x0.5x0.1 0.314 0.031 96.70% 96.70% Full  Thickness Without Exposed Support Structures Medium Serosanguineous red, brown Distinct, outline attached Small (1-33%) Red Large (67-100%) Fascia: No Tendon: No Muscle: No Joint: No Bone: No Small (1-33%) 11:08 Lidocaine 4% Topical Solution Slough  Non-Viable Tissue 0.31 Curette Minimum Pressure 0 0 Procedure was tolerated well 0.8x0.5x0.1 0.031 Scarring: No Maceration: No Dry/Scaly: No Hemosiderin Staining: Yes No Abnormality Yes Debridement] Treatment Notes Wound #19 (Lower Leg) Wound Laterality: Left,  Posterior Cleanser Vashe 5.8 (oz) Discharge Instruction: Cleanse the wound with Vashe prior to applying a clean dressing using gauze sponges, not tissue or cotton balls. Peri-Wound Care Triamcinolone 15 (g) Discharge Instruction: Use triamcinolone 15 (g) as directed Sween Lotion (Moisturizing lotion) Discharge Instruction: Apply moisturizing lotion as directed Topical Primary Dressing Maxorb Extra Ag+ Alginate Dressing, 4x4.75 (in/in) Discharge Instruction: Apply to wound bed as instructed ABRYANA, LYKENS B ([id number]) [id number] Page 4 of 10 Secondary Dressing ABD Pad, 8x10 Discharge Instruction: Apply over primary dressing as directed. Secured With Compression Wrap Kerlix Roll 4.5x3.1 (in/yd) Discharge Instruction: Apply Kerlix and Coban compression as directed. Coban Self-Adherent Wrap 4x5 (in/yd) Discharge Instruction: Apply over Kerlix as directed. Compression Stockings Add-Ons Wound #21 (Lower Leg) Wound Laterality: Left, Lateral Cleanser Vashe 5.8 (oz) Discharge Instruction: Cleanse the wound with Vashe prior to applying a clean dressing using gauze sponges, not tissue or cotton balls. Peri-Wound Care Triamcinolone 15 (g) Discharge Instruction: Use triamcinolone 15 (g) as directed Sween Lotion (Moisturizing lotion) Discharge Instruction: Apply moisturizing lotion as directed Topical Gentamicin Discharge  Instruction: As directed by physician Mupirocin Ointment Discharge Instruction: Apply Mupirocin (Bactroban) as instructed Primary Dressing Maxorb Extra Ag+ Alginate Dressing, 4x4.75 (in/in) Discharge Instruction: Apply to wound bed as instructed Secondary Dressing ABD Pad, 8x10 Discharge Instruction: Apply over primary dressing as directed. Secured With Compression Wrap Kerlix Roll 4.5x3.1 (in/yd) Discharge Instruction: Apply Kerlix and Coban compression as directed. Coban Self-Adherent Wrap 4x5 (in/yd) Discharge Instruction: Apply over  Kerlix as directed. Compression Stockings Add-Ons Wound #22 (Lower Leg) Wound Laterality: Left, Lateral, Proximal Cleanser Vashe 5.8 (oz) Discharge Instruction: Cleanse the wound with Vashe prior to applying a clean dressing using gauze sponges, not tissue or cotton balls. Peri-Wound Care Triamcinolone 15 (g) Discharge Instruction: Use triamcinolone 15 (g) as directed Sween Lotion (Moisturizing lotion) Discharge Instruction: Apply moisturizing lotion as directed Topical Gentamicin Discharge Instruction: As directed by physician Mupirocin Ointment Discharge Instruction: Apply Mupirocin (Bactroban) as instructed JAPLEEN, TORNOW B ([id number]) [id number] Page 5 of 10 Primary Dressing Maxorb Extra Ag+ Alginate Dressing, 4x4.75 (in/in) Discharge Instruction: Apply to wound bed as instructed Secondary Dressing ABD Pad, 8x10 Discharge Instruction: Apply over primary dressing as directed. Secured With Compression Wrap Kerlix Roll 4.5x3.1 (in/yd) Discharge Instruction: Apply Kerlix and Coban compression as directed. Coban Self-Adherent Wrap 4x5 (in/yd) Discharge Instruction: Apply over Kerlix as directed. Compression Stockings Add-Ons Electronic Signature(s) Signed: 07/19/2023 9:17:21 AM By: Duanne Guess MD FACS Entered By: Duanne Guess on 07/19/2023 09:17:20 -------------------------------------------------------------------------------- Multi-Disciplinary Care Plan Details Patient Name: Date of Service: Stacey [place], [place] Stacey B. 07/09/2023 11:30 A M Medical Record Number: [id number] Patient Account Number: [id number] Date of Birth/Sex: Treating RN: 1951-02-26 (72 y.o. [name] Primary Care Ellsie Violette: [name] Other Clinician: Referring Karver Fadden: Treating Mariah Harn/Extender: [name] in Treatment: 8 Multidisciplinary Care Plan reviewed with physician Active Inactive Venous Leg Ulcer Nursing  Diagnoses: Knowledge deficit related to disease process and management Potential for venous Insuffiency (use before diagnosis confirmed) Goals: Patient will maintain optimal edema control Date Initiated: 05/21/2023 Target Resolution Date: 07/23/2023 Goal Status: Active Interventions: Assess peripheral edema status every visit. Compression as ordered Provide education on venous insufficiency Treatment Activities: Therapeutic compression applied : 05/21/2023 Notes: Wound/Skin Impairment Nursing Diagnoses: Impaired tissue integrity Goals: Patient/caregiver will verbalize understanding of skin care regimen SHAZIA, MITCHENER B ([id number]) [phone].pdf Page 6 of 10 Date Initiated: 05/13/2023 Target Resolution Date: 07/23/2023 Goal Status: Active Interventions: Assess patient/caregiver ability to obtain necessary supplies Assess patient/caregiver ability to perform ulcer/skin care regimen upon admission and as needed Assess ulceration(s) every visit Provide education on ulcer and skin care Screen for HBO Treatment Activities: Skin care regimen initiated : 05/13/2023 Topical wound management initiated : 05/13/2023 Notes: Electronic Signature(s) Signed: 07/14/2023 11:11:00 AM By: Brenton Grills Entered By: Brenton Grills on 07/12/2023 12:24:31 -------------------------------------------------------------------------------- Pain Assessment Details Patient Name: Date of Service: [name], [name] 07/09/2023 11:30 A M Medical Record Number: [id number] Patient Account Number: [id number] Date of Birth/Sex: Treating RN: 06-09-1951 (72 y.o. [name] Primary Care Danyia Borunda: [name] Other Clinician: Referring Nayquan Evinger: Treating Scout Gumbs/Extender: [name] in Treatment: 8 Active Problems Location of Pain Severity and Description of Pain Patient Has Paino No Site Locations Pain Management and Medication Current Pain  Management: Electronic Signature(s) Signed: 07/14/2023 11:11:00 AM By: Brenton Grills Entered By: Brenton Grills on 07/12/2023 12:21:48 [name] ([id number]) [id number] Page 7 of 10 -------------------------------------------------------------------------------- Patient/Caregiver Education Details Patient Name: Date of Service: [place], [name] 7/19/2024andnbsp11:30 A M Medical Record Number: [id number] Patient Account Number: [id number] Date  of Birth/Gender: Treating RN: Aug 12, 1951 (72 y.o. [name] Primary Care Physician: [name] Other Clinician: Referring Physician: Treating Physician/Extender: [name] in Treatment: 8 Education Assessment Education Provided To: Patient Education Topics Provided Wound/Skin Impairment: Methods: Explain/Verbal Responses: State content correctly Electronic Signature(s) Signed: 07/14/2023 11:11:00 AM By: Brenton Grills Entered By: Brenton Grills on 07/12/2023 12:24:53 -------------------------------------------------------------------------------- Wound Assessment Details Patient Name: Date of Service: [name], [name] 07/09/2023 11:30 A M Medical Record Number: [id number] Patient Account Number: [id number] Date of Birth/Sex: Treating RN: [birth year] (72 y.o. [name] Primary Care Ruhee Enck: [name] Other Clinician: Referring Brennan Karam: Treating Kyleena Scheirer/Extender: [name] in Treatment: 8 Wound Status Wound Number: 19 Primary Venous Leg Ulcer Etiology: Wound Location: Left, Posterior Lower Leg Wound Open Wounding Event: Blister Status: Date Acquired: 02/19/2023 Comorbid Glaucoma, Arrhythmia, Hypertension, Peripheral Arterial Disease, Weeks Of Treatment: 8 History: Peripheral Venous Disease, Gout, Osteoarthritis Clustered Wound: Yes Wound Measurements Length: (cm) 1.5 Width: (cm) 0.9 Depth: (cm) 0.1 Area:  (cm) 1.06 Volume: (cm) 0.106 % Reduction in Area: 96.1% % Reduction in Volume: 96.1% Epithelialization: Small (1-33%) Tunneling: No Undermining: No Wound Description Classification: Full Thickness Without Exposed Support Structures Wound Margin: Distinct, outline attached Exudate Amount: Medium Exudate Type: Serosanguineous Exudate Color: red, brown Foul Odor After Cleansing: No Slough/Fibrino Yes Wound Bed Granulation Amount: Large (67-100%) Exposed Structure Granulation Quality: Red, Pink Fascia Exposed: No Dudek, Aneita B ([id number]) [phone].pdf Page 8 of 10 Necrotic Amount: Small (1-33%) Fat Layer (Subcutaneous Tissue) Exposed: Yes Necrotic Quality: Adherent Slough Tendon Exposed: No Muscle Exposed: No Joint Exposed: No Bone Exposed: No Periwound Skin Texture Texture Color No Abnormalities Noted: Yes No Abnormalities Noted: No Hemosiderin Staining: Yes Moisture No Abnormalities Noted: No Temperature / Pain Dry / Scaly: No Temperature: No Abnormality Maceration: No Tenderness on Palpation: Yes Electronic Signature(s) Signed: 07/14/2023 11:11:00 AM By: Brenton Grills Entered By: Brenton Grills on 07/12/2023 12:23:28 -------------------------------------------------------------------------------- Wound Assessment Details Patient Name: Date of Service: [name], [name] 07/09/2023 11:30 A M Medical Record Number: [id number] Patient Account Number: [id number] Date of Birth/Sex: Treating RN: [birth year] (72 y.o. [name] Primary Care Nyomie Ehrlich: [name] Other Clinician: Referring Yaphet Smethurst: Treating Corinthian Kemler/Extender: [name] in Treatment: 8 Wound Status Wound Number: 21 Primary Venous Leg Ulcer Etiology: Wound Location: Left, Lateral Lower Leg Wound Open Wounding Event: Gradually Appeared Status: Date Acquired: 05/21/2023 Comorbid Glaucoma, Arrhythmia, Hypertension, Peripheral Arterial  Disease, Weeks Of Treatment: 7 History: Peripheral Venous Disease, Gout, Osteoarthritis Clustered Wound: No Wound Measurements Length: (cm) 1.3 Width: (cm) 1.1 Depth: (cm) 0.1 Area: (cm) 1.123 Volume: (cm) 0.112 % Reduction in Area: -57.1% % Reduction in Volume: -57.7% Epithelialization: Small (1-33%) Tunneling: No Undermining: No Wound Description Classification: Full Thickness Without Exposed Suppo Wound Margin: Flat and Intact Exudate Amount: Medium Exudate Type: Serosanguineous Exudate Color: red, brown rt Structures Foul Odor After Cleansing: No Slough/Fibrino Yes Wound Bed Granulation Amount: Large (67-100%) Exposed Structure Granulation Quality: Red Fascia Exposed: No Necrotic Amount: Small (1-33%) Fat Layer (Subcutaneous Tissue) Exposed: Yes Necrotic Quality: Adherent Slough Tendon Exposed: No Muscle Exposed: No Joint Exposed: No Bone Exposed: No Periwound Skin Texture Texture Color No Abnormalities Noted: Yes No Abnormalities Noted: No Hemosiderin Staining: Yes Moisture No Abnormalities Noted: Yes Temperature / Pain Temperature: No Abnormality Tenderness on Palpation: Yes SIMRIT, GOHLKE B ([id number]) [phone].pdf Page 9 of 10 Electronic Signature(s) Signed: 07/14/2023 11:11:00 AM By: Brenton Grills Entered By: Brenton Grills on 07/12/2023 12:23:38 -------------------------------------------------------------------------------- Wound Assessment  Details Patient Name: Date of Service: [place], [name] 07/09/2023 11:30 A M Medical Record Number: [id number] Patient Account Number: [id number] Date of Birth/Sex: Treating RN: 1951/01/08 (72 y.o. [name] Primary Care Altovise Wahler: [name] Other Clinician: Referring Alene Bergerson: Treating Laden Fieldhouse/Extender: [name] in Treatment: 8 Wound Status Wound Number: 22 Primary Venous Leg Ulcer Etiology: Wound Location: Left, Proximal, Lateral Lower  Leg Wound Open Wounding Event: Blister Status: Date Acquired: 05/22/2023 Comorbid Glaucoma, Arrhythmia, Hypertension, Peripheral Arterial Disease, Weeks Of Treatment: 6 History: Peripheral Venous Disease, Gout, Osteoarthritis Clustered Wound: No Wound Measurements Length: (cm) 0.8 Width: (cm) 0.5 Depth: (cm) 0.1 Area: (cm) 0.314 Volume: (cm) 0.031 % Reduction in Area: 96.7% % Reduction in Volume: 96.7% Epithelialization: Small (1-33%) Tunneling: No Undermining: No Wound Description Classification: Full Thickness Without Exposed Suppor Wound Margin: Distinct, outline attached Exudate Amount: Medium Exudate Type: Serosanguineous Exudate Color: red, brown t Structures Foul Odor After Cleansing: No Slough/Fibrino Yes Wound Bed Granulation Amount: Small (1-33%) Exposed Structure Granulation Quality: Red Fascia Exposed: No Necrotic Amount: Large (67-100%) Fat Layer (Subcutaneous Tissue) Exposed: Yes Necrotic Quality: Adherent Slough Tendon Exposed: No Muscle Exposed: No Joint Exposed: No Bone Exposed: No Periwound Skin Texture Texture Color No Abnormalities Noted: Yes No Abnormalities Noted: No Hemosiderin Staining: Yes Moisture No Abnormalities Noted: No Temperature / Pain Dry / Scaly: No Temperature: No Abnormality Maceration: No Tenderness on Palpation: Yes Electronic Signature(s) Signed: 07/14/2023 11:11:00 AM By: Brenton Grills Entered By: Brenton Grills on 07/12/2023 12:23:48 [name] ([id number]) [id number] Page 10 of 10 -------------------------------------------------------------------------------- Vitals Details Patient Name: Date of Service: [place], [name] 07/09/2023 11:30 A M Medical Record Number: [id number] Patient Account Number: [id number] Date of Birth/Sex: Treating RN: 28-Aug-1951 (72 y.o. [name] Primary Care Ashani Pumphrey: [name] Other Clinician: Referring Janette Harvie: Treating Dacia Capers/Extender:  [name] in Treatment: 8 Vital Signs Time Taken: 11:08 Temperature (F): 98.3 Height (in): 64 Pulse (bpm): 81 Weight (lbs): 138 Respiratory Rate (breaths/min): 18 Body Mass Index (BMI): 23.7 Blood Pressure (mmHg): 107/69 Reference Range: 80 - 120 mg / dl Electronic Signature(s) Signed: 07/14/2023 11:11:00 AM By: Brenton Grills Entered By: Brenton Grills on 07/12/2023 12:21:41

## 2023-07-14 NOTE — Patient Instructions (Signed)

## 2023-07-15 ENCOUNTER — Other Ambulatory Visit: Payer: Self-pay | Admitting: Hematology

## 2023-07-15 DIAGNOSIS — D72829 Elevated white blood cell count, unspecified: Secondary | ICD-10-CM

## 2023-07-16 ENCOUNTER — Encounter (HOSPITAL_BASED_OUTPATIENT_CLINIC_OR_DEPARTMENT_OTHER): Payer: 59 | Admitting: General Surgery

## 2023-07-16 DIAGNOSIS — I70248 Atherosclerosis of native arteries of left leg with ulceration of other part of lower left leg: Secondary | ICD-10-CM | POA: Diagnosis not present

## 2023-07-16 NOTE — Progress Notes (Addendum)
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 PM By: Duanne Guess MD FACS Entered By: Duanne Guess on 07/16/2023 12:08:33 [name] ([id number]) [id number] Page 16 of 16 -------------------------------------------------------------------------------- SuperBill Details Patient Name: Date of Service: [place], [name] 07/16/2023 Medical Record Number: [id number] Patient Account Number: [id number] Date of Birth/Sex: Treating RN: [birth year] (72 y.o. [name] Primary Care Provider: Neale Burly Other Clinician: Referring Provider: Treating Provider/Extender: Jolyn Lent in Treatment: 9 Diagnosis Coding ICD-10 Codes Code Description [id number] Non-pressure chronic  ulcer of other part of left lower leg with fat layer exposed I73.9 Peripheral vascular disease, unspecified Z89.611 Acquired absence of right leg above knee D45 Polycythemia vera Facility Procedures : CPT4 Code: [id number] Description: 97597 - DEBRIDE WOUND 1ST 20 SQ CM OR < ICD-10 Diagnosis Description L97.822 Non-pressure  chronic ulcer of other part of left lower leg with fat layer expose Modifier: d Quantity: 1 Physician Procedures : CPT4 Code Description Modifier [id number] - WC PHYS LEVEL 4 - EST PT 25 ICD-10 Diagnosis Description L97.822 Non-pressure chronic ulcer of other part of left lower leg with fat layer exposed I73.9 Peripheral vascular disease, unspecified Z89.611  Acquired absence of right leg above knee D45 Polycythemia vera Quantity: 1 : [id number] 97597 - WC PHYS DEBR WO ANESTH 20 SQ CM ICD-10 Diagnosis Description L97.822 Non-pressure chronic ulcer of other part of left lower leg with fat layer exposed Quantity: 1 Electronic Signature(s) Signed: 07/16/2023 12:11:05 PM By: Duanne Guess MD FACS Entered By: Duanne Guess on 07/16/2023 12:11:05

## 2023-07-16 NOTE — Progress Notes (Signed)
[name], [name] ([id number]) [id number] Page 1 of 10 Visit Report for 07/16/2023 Arrival Information Details Patient Name: Date of Service: [place], [name] 07/16/2023 11:30 A M Medical Record Number: [id number] Patient Account Number: [id number] Date of Birth/Sex: Treating RN: [name] (72 y.o. F) Primary Care [name]: [name] Other Clinician: Referring [name]: Treating [name]/Extender: [name] in Treatment: 9 Visit Information History Since Last Visit All ordered tests and consults were completed: No Patient Arrived: [name] Added or deleted any medications: No Arrival Time: 11:35 Any new allergies or adverse reactions: No Accompanied By: self Had a fall or experienced change in No Transfer Assistance: Manual activities of daily living that may affect Patient Identification Verified: Yes risk of falls: Secondary Verification Process Completed: Yes Signs or symptoms of abuse/neglect since last visito No Patient Requires Transmission-Based Precautions: No Hospitalized since last visit: No Patient Has Alerts: No Implantable device outside of the clinic excluding No cellular tissue based products placed in the center since last visit: Pain Present Now: No Electronic Signature(s) Signed: 07/16/2023 12:05:52 PM By: [name] Entered By: [name] on 07/16/2023 11:36:18 -------------------------------------------------------------------------------- Encounter Discharge Information Details Patient Name: Date of Service: [name], [name] 07/16/2023 11:30 A M Medical Record Number: [id number] Patient Account Number: [id number] Date of Birth/Sex: Treating RN: [birth year] (72 y.o. [name] Primary Care Brendi Mccarroll: [name] Other Clinician: Referring [name]: Treating [name]/Extender: [name] in Treatment: 9 Encounter Discharge Information Items Post  Procedure Vitals Discharge Condition: Stable Temperature (F): 98.1 Ambulatory Status: Walker Pulse (bpm): 64 Discharge Destination: Home Respiratory Rate (breaths/min): 18 Transportation: Private Auto Blood Pressure (mmHg): 128/72 Accompanied By: self Schedule Follow-up Appointment: Yes Clinical Summary of Care: Patient Declined Electronic Signature(s) Signed: 07/16/2023 12:49:07 PM By: [name] Entered By: [name] on 07/16/2023 12:21:01 [name] ([id number]) [id number] Page 2 of 10 -------------------------------------------------------------------------------- Lower Extremity Assessment Details Patient Name: Date of Service: [place], [name] 07/16/2023 11:30 A M Medical Record Number: [id number] Patient Account Number: [id number] Date of Birth/Sex: Treating RN: [birth year] (72 y.o. [name] Primary Care [name]: [name] Other Clinician: Referring [name]: Treating [name]/Extender: [name] in Treatment: 9 Edema Assessment Assessed: [name]: No] [Right: No] Edema: [Left: N] [Right: o] Calf Left: Right: Point of Measurement: From Medial Instep 30.5 cm Ankle Left: Right: Point of Measurement: From Medial Instep 17.5 cm Vascular Assessment Pulses: Dorsalis Pedis Palpable: [Left:Yes] Extremity colors, hair growth, and conditions: Extremity Color: [Left:Normal] Temperature of Extremity: [Left:Warm] Capillary Refill: [Left:< 3 seconds] Dependent Rubor: [Left:No No] Toe Nail Assessment Left: Right: Thick: No Discolored: No Deformed: No Improper Length and Hygiene: No Electronic Signature(s) Signed: 07/16/2023 12:49:07 PM By: [name] Entered By: [name] on 07/16/2023 11:54:50 -------------------------------------------------------------------------------- Multi Wound Chart Details Patient Name: Date of Service: [name], [name] 07/16/2023 11:30 A M Medical  Record Number: [id number] Patient Account Number: [id number] Date of Birth/Sex: Treating RN: [name] (72 y.o. F) Primary Care [name]: [name] Other Clinician: Referring [name]: Treating [name]/Extender: [name] in Treatment: 9 Vital Signs Height(in): 64 Pulse(bpm): 76 Weight(lbs): 138 Blood Pressure(mmHg): 198/66 Body Mass Index(BMI): 23.7 Temperature(F): 98.3 [name], [name] ([id number]) [phone].pdf Page 3 of 10 Respiratory Rate(breaths/min): 18 [19:Photos: No Photos Left, Posterior Lower Leg Wound Location: Blister Wounding Event: Venous Leg Ulcer Primary Etiology: 02/19/2023 Date Acquired: 9 Weeks of Treatment: Open Wound Status: No Wound Recurrence: Yes Clustered Wound: 1.2x0.8x0.1 Measurements L x W x D  (  cm) 0.754 A (cm) : rea 0.075 Volume (cm) : 97.20% % Reduction in A rea: 97.20% % Reduction in Volume: Full Thickness Without Exposed Classification: Support Structures Medium Exudate Amount: Serosanguineous Exudate Type: red, brown Exudate Color:]  [21:No Photos Left, Lateral Lower Leg Gradually Appeared Venous Leg Ulcer 05/21/2023 8 Open No No 1x0.5x0.1 0.393 0.039 45.00% 45.10% Full Thickness Without Exposed Support Structures Medium Serosanguineous red, brown] [22:No Photos Left, Proximal,  Lateral Lower Leg Blister Venous Leg Ulcer 05/22/2023 7 Open No No 0.1x0.1x0.1 0.008 0.001 99.90% 99.90% Full Thickness Without Exposed Support Structures Medium Serosanguineous red, brown] Treatment Notes Electronic Signature(s) Signed: 07/16/2023 11:52:46 AM By: Duanne Guess MD FACS Entered By: Duanne Guess on 07/16/2023 11:52:46 -------------------------------------------------------------------------------- Multi-Disciplinary Care Plan Details Patient Name: Date of Service: [name], [name] 07/16/2023 11:30 A M Medical Record Number: [id number] Patient Account Number: [id number] Date of Birth/Sex: Treating  RN: [birth year] [name] (72 y.o. [name] Primary Care [name]: [name] Other Clinician: Referring [name]: Treating [name]/Extender: [name] in Treatment: 9 Multidisciplinary Care Plan reviewed with physician Active Inactive Venous Leg Ulcer Nursing Diagnoses: Knowledge deficit related to disease process and management Potential for venous Insuffiency (use before diagnosis confirmed) Goals: Patient will maintain optimal edema control Date Initiated: 05/21/2023 Target Resolution Date: 07/23/2023 Goal Status: Active Interventions: Assess peripheral edema status every visit. Compression as ordered Provide education on venous insufficiency Treatment Activities: Therapeutic compression applied : 05/21/2023 Notes: Wound/Skin Impairment TIERANY, TEPE ([id number]) [id number] Page 4 of 10 Nursing Diagnoses: Impaired tissue integrity Goals: Patient/caregiver will verbalize understanding of skin care regimen Date Initiated: 05/13/2023 Target Resolution Date: 07/23/2023 Goal Status: Active Interventions: Assess patient/caregiver ability to obtain necessary supplies Assess patient/caregiver ability to perform ulcer/skin care regimen upon admission and as needed Assess ulceration(s) every visit Provide education on ulcer and skin care Screen for HBO Treatment Activities: Skin care regimen initiated : 05/13/2023 Topical wound management initiated : 05/13/2023 Notes: Electronic Signature(s) Signed: 07/16/2023 12:49:07 PM By: [name] Entered By: [name] on 07/16/2023 11:55:26 -------------------------------------------------------------------------------- Pain Assessment Details Patient Name: Date of Service: [name], [name] 07/16/2023 11:30 A M Medical Record Number: [id number] Patient Account Number: [id number] Date of Birth/Sex: Treating RN: [birth year] (72 y.o. F) Primary Care [place]: [name] Other Clinician: Referring Dawnita Molner: Treating Shawnette Augello/Extender: [name] in Treatment: 9 Active Problems Location of Pain Severity and Description of Pain Patient Has Paino No Site Locations Pain Management and Medication Current Pain Management: Electronic Signature(s) Signed: 07/16/2023 12:05:52 PM By: [name] Entered By: [name] on 07/16/2023 11:36:48 Ezekiel Ina [name] ([id number]) [id number] Page 5 of 10 -------------------------------------------------------------------------------- Patient/Caregiver Education Details Patient Name: Date of Service: [place], [name] 7/26/2024andnbsp11:30 A M Medical Record Number: [id number] Patient Account Number: [id number] Date of Birth/Gender: Treating RN: [name] (72 y.o. [name] Primary Care Physician: [name] Other Clinician: Referring Physician: Treating Physician/Extender: [name] in Treatment: 9 Education Assessment Education Provided To: Patient Education Topics Provided Wound/Skin Impairment: Methods: Explain/Verbal Responses: State content correctly Electronic Signature(s) Signed: 07/16/2023 12:49:07 PM By: [name] Entered By: [name] on 07/16/2023 11:58:59 -------------------------------------------------------------------------------- Wound Assessment Details Patient Name: Date of Service: [name], [name] 07/16/2023 11:30 A M Medical Record Number: [id number] Patient Account Number: [id number] Date of Birth/Sex: Treating RN: [name] (72 y.o. F) Primary Care Seng Larch: [name] Other Clinician: Referring Garold Sheeler: Treating Jim Lundin/Extender: [name] in Treatment: 9 Wound Status Wound Number: 19  Primary Venous Leg Ulcer Etiology: Wound Location: Left, Posterior Lower Leg Wound Open Wounding Event: Blister Status: Date Acquired:  02/19/2023 Comorbid Glaucoma, Arrhythmia, Hypertension, Peripheral Arterial Disease, Weeks Of Treatment: 9 History: Peripheral Venous Disease, Gout, Osteoarthritis Clustered Wound: Yes Photos Wound Measurements Length: (cm) 1.2 Width: (cm) 0.8 Scaduto, [name] ([id number]) Depth: (cm) 0.1 Area: (cm) 0.754 Volume: (cm) 0.075 % Reduction in Area: 97.2% % Reduction in Volume: 97.2% [id number] Page 6 of 10 Epithelialization: Small (1-33%) Wound Description Classification: Full Thickness Without Exposed Support Structures Wound Margin: Distinct, outline attached Exudate Amount: Medium Exudate Type: Serosanguineous Exudate Color: red, brown Foul Odor After Cleansing: No Slough/Fibrino Yes Wound Bed Granulation Amount: Large (67-100%) Exposed Structure Granulation Quality: Red, Pink Fascia Exposed: No Necrotic Amount: Small (1-33%) Fat Layer (Subcutaneous Tissue) Exposed: Yes Necrotic Quality: Adherent Slough Tendon Exposed: No Muscle Exposed: No Joint Exposed: No Bone Exposed: No Periwound Skin Texture Texture Color No Abnormalities Noted: Yes No Abnormalities Noted: No Hemosiderin Staining: Yes Moisture No Abnormalities Noted: No Temperature / Pain Dry / Scaly: No Temperature: No Abnormality Maceration: No Tenderness on Palpation: Yes Treatment Notes Wound #19 (Lower Leg) Wound Laterality: Left, Posterior Cleanser Vashe 5.8 (oz) Discharge Instruction: Cleanse the wound with Vashe prior to applying a clean dressing using gauze sponges, not tissue or cotton balls. Peri-Wound Care Triamcinolone 15 (g) Discharge Instruction: Use triamcinolone 15 (g) as directed Sween Lotion (Moisturizing lotion) Discharge Instruction: Apply moisturizing lotion as directed Topical Primary Dressing Maxorb Extra Ag+ Alginate Dressing, 4x4.75 (in/in) Discharge Instruction: Apply to wound bed as instructed Secondary Dressing ABD Pad, 8x10 Discharge Instruction:  Apply over primary dressing as directed. Secured With Compression Wrap Kerlix Roll 4.5x3.1 (in/yd) Discharge Instruction: Apply Kerlix and Coban compression as directed. Coban Self-Adherent Wrap 4x5 (in/yd) Discharge Instruction: Apply over Kerlix as directed. Compression Stockings Add-Ons Electronic Signature(s) Signed: 07/16/2023 12:49:07 PM By: [name] Entered By: [name] on 07/16/2023 11:53:06 [name] ([id number]) [id number] Page 7 of 10 -------------------------------------------------------------------------------- Wound Assessment Details Patient Name: Date of Service: [place], [name] 07/16/2023 11:30 A M Medical Record Number: [id number] Patient Account Number: [id number] Date of Birth/Sex: Treating RN: [birth year] (72 y.o. F) Primary Care [place]: [name] Other Clinician: Referring India Jolin: Treating Micha Erck/Extender: [name] in Treatment: 9 Wound Status Wound Number: 21 Primary Venous Leg Ulcer Etiology: Wound Location: Left, Lateral Lower Leg Wound Open Wounding Event: Gradually Appeared Status: Date Acquired: 05/21/2023 Comorbid Glaucoma, Arrhythmia, Hypertension, Peripheral Arterial Disease, Weeks Of Treatment: 8 History: Peripheral Venous Disease, Gout, Osteoarthritis Clustered Wound: No Photos Wound Measurements Length: (cm) 1 Width: (cm) 0.5 Depth: (cm) 0.1 Area: (cm) 0.393 Volume: (cm) 0.039 % Reduction in Area: 45% % Reduction in Volume: 45.1% Epithelialization: Small (1-33%) Wound Description Classification: Full Thickness Without Exposed Suppor Wound Margin: Flat and Intact Exudate Amount: Medium Exudate Type: Serosanguineous Exudate Color: red, brown t Structures Foul Odor After Cleansing: No Slough/Fibrino Yes Wound Bed Granulation Amount: Large (67-100%) Exposed Structure Granulation Quality: Red Fascia Exposed: No Necrotic Amount: Small  (1-33%) Fat Layer (Subcutaneous Tissue) Exposed: Yes Necrotic Quality: Adherent Slough Tendon Exposed: No Muscle Exposed: No Joint Exposed: No Bone Exposed: No Periwound Skin Texture Texture Color No Abnormalities Noted: Yes No Abnormalities Noted: No Hemosiderin Staining: Yes Moisture No Abnormalities Noted: Yes Temperature / Pain Temperature: No Abnormality Tenderness on Palpation: Yes Treatment Notes Wound #21 (Lower Leg) Wound Laterality: Left, Lateral Cleanser [name], [name] ([id number]) [phone].pdf Page 8 of 10 Vashe 5.8 (oz) Discharge Instruction: Cleanse the  wound with Vashe prior to applying a clean dressing using gauze sponges, not tissue or cotton balls. Peri-Wound Care Triamcinolone 15 (g) Discharge Instruction: Use triamcinolone 15 (g) as directed Sween Lotion (Moisturizing lotion) Discharge Instruction: Apply moisturizing lotion as directed Topical Gentamicin Discharge Instruction: As directed by physician Mupirocin Ointment Discharge Instruction: Apply Mupirocin (Bactroban) as instructed Primary Dressing Maxorb Extra Ag+ Alginate Dressing, 4x4.75 (in/in) Discharge Instruction: Apply to wound bed as instructed Secondary Dressing ABD Pad, 8x10 Discharge Instruction: Apply over primary dressing as directed. Secured With Compression Wrap Kerlix Roll 4.5x3.1 (in/yd) Discharge Instruction: Apply Kerlix and Coban compression as directed. Coban Self-Adherent Wrap 4x5 (in/yd) Discharge Instruction: Apply over Kerlix as directed. Compression Stockings Add-Ons Electronic Signature(s) Signed: 07/16/2023 12:49:07 PM By: [name] Entered By: [name] on 07/16/2023 11:53:37 -------------------------------------------------------------------------------- Wound Assessment Details Patient Name: Date of Service: [name], [name] 07/16/2023 11:30 A M Medical Record Number: [id number] Patient Account Number: [id number] Date of  Birth/Sex: Treating RN: [birth year] [name] (72 y.o. F) Primary Care Logun Colavito: [name] Other Clinician: Referring Airon Sahni: Treating Aylee Littrell/Extender: [name] in Treatment: 9 Wound Status Wound Number: 22 Primary Venous Leg Ulcer Etiology: Wound Location: Left, Proximal, Lateral Lower Leg Wound Open Wounding Event: Blister Status: Date Acquired: 05/22/2023 Comorbid Glaucoma, Arrhythmia, Hypertension, Peripheral Arterial Disease, Weeks Of Treatment: 7 History: Peripheral Venous Disease, Gout, Osteoarthritis Clustered Wound: No Photos [name], [name] ([id number]) [id number] Page 9 of 10 Wound Measurements Length: (cm) 0.1 Width: (cm) 0.1 Depth: (cm) 0.1 Area: (cm) 0.008 Volume: (cm) 0.001 % Reduction in Area: 99.9% % Reduction in Volume: 99.9% Epithelialization: Small (1-33%) Wound Description Classification: Full Thickness Without Exposed Support Structures Wound Margin: Distinct, outline attached Exudate Amount: Medium Exudate Type: Serosanguineous Exudate Color: red, brown Foul Odor After Cleansing: No Slough/Fibrino Yes Wound Bed Granulation Amount: Small (1-33%) Exposed Structure Granulation Quality: Red Fascia Exposed: No Necrotic Amount: Large (67-100%) Fat Layer (Subcutaneous Tissue) Exposed: Yes Necrotic Quality: Adherent Slough Tendon Exposed: No Muscle Exposed: No Joint Exposed: No Bone Exposed: No Periwound Skin Texture Texture Color No Abnormalities Noted: Yes No Abnormalities Noted: No Hemosiderin Staining: Yes Moisture No Abnormalities Noted: No Temperature / Pain Dry / Scaly: No Temperature: No Abnormality Maceration: No Tenderness on Palpation: Yes Treatment Notes Wound #22 (Lower Leg) Wound Laterality: Left, Lateral, Proximal Cleanser Vashe 5.8 (oz) Discharge Instruction: Cleanse the wound with Vashe prior to applying a clean dressing using gauze sponges, not tissue or cotton  balls. Peri-Wound Care Triamcinolone 15 (g) Discharge Instruction: Use triamcinolone 15 (g) as directed Sween Lotion (Moisturizing lotion) Discharge Instruction: Apply moisturizing lotion as directed Topical Gentamicin Discharge Instruction: As directed by physician Mupirocin Ointment Discharge Instruction: Apply Mupirocin (Bactroban) as instructed Primary Dressing Maxorb Extra Ag+ Alginate Dressing, 4x4.75 (in/in) Discharge Instruction: Apply to wound bed as instructed Secondary Dressing ABD Pad, 8x10 Discharge Instruction: Apply over primary dressing as directed. [name], [name] ([id number]) [id number] Page 10 of 10 Secured With Compression Wrap Kerlix Roll 4.5x3.1 (in/yd) Discharge Instruction: Apply Kerlix and Coban compression as directed. Coban Self-Adherent Wrap 4x5 (in/yd) Discharge Instruction: Apply over Kerlix as directed. Compression Stockings Add-Ons Electronic Signature(s) Signed: 07/16/2023 12:49:07 PM By: [name] Entered By: [name] on 07/16/2023 11:54:07 -------------------------------------------------------------------------------- Vitals Details Patient Name: Date of Service: [name], [place] [name] 07/16/2023 11:30 A M Medical Record Number: [id number] Patient Account Number: [id number] Date of Birth/Sex: Treating RN: [name] (72 y.o. F) Primary Care Niang [place]: [name] Other Clinician: Referring Tegh Franek: Treating Delcia Spitzley/Extender: [name]  in Treatment: 9 Vital Signs Time Taken: 11:36 Temperature (F): 98.3 Height (in): 64 Pulse (bpm): 76 Weight (lbs): 138 Respiratory Rate (breaths/min): 18 Body Mass Index (BMI): 23.7 Blood Pressure (mmHg): 198/66 Reference Range: 80 - 120 mg / dl Electronic Signature(s) Signed: 07/16/2023 12:05:52 PM By: [name] Entered By: [name] on 07/16/2023 11:36:41

## 2023-07-21 ENCOUNTER — Encounter: Payer: Self-pay | Admitting: Hematology

## 2023-07-21 NOTE — Progress Notes (Signed)
This encounter was created in error - please disregard.

## 2023-07-23 ENCOUNTER — Encounter (HOSPITAL_BASED_OUTPATIENT_CLINIC_OR_DEPARTMENT_OTHER): Payer: 59 | Attending: General Surgery | Admitting: General Surgery

## 2023-07-23 DIAGNOSIS — Z95820 Peripheral vascular angioplasty status with implants and grafts: Secondary | ICD-10-CM | POA: Insufficient documentation

## 2023-07-23 DIAGNOSIS — Z87891 Personal history of nicotine dependence: Secondary | ICD-10-CM | POA: Diagnosis not present

## 2023-07-23 DIAGNOSIS — Z89611 Acquired absence of right leg above knee: Secondary | ICD-10-CM | POA: Diagnosis not present

## 2023-07-23 DIAGNOSIS — I4891 Unspecified atrial fibrillation: Secondary | ICD-10-CM | POA: Insufficient documentation

## 2023-07-23 DIAGNOSIS — L97822 Non-pressure chronic ulcer of other part of left lower leg with fat layer exposed: Secondary | ICD-10-CM | POA: Insufficient documentation

## 2023-07-23 DIAGNOSIS — Z8673 Personal history of transient ischemic attack (TIA), and cerebral infarction without residual deficits: Secondary | ICD-10-CM | POA: Insufficient documentation

## 2023-07-23 DIAGNOSIS — I10 Essential (primary) hypertension: Secondary | ICD-10-CM | POA: Diagnosis not present

## 2023-07-23 DIAGNOSIS — Z7901 Long term (current) use of anticoagulants: Secondary | ICD-10-CM | POA: Insufficient documentation

## 2023-07-23 DIAGNOSIS — I739 Peripheral vascular disease, unspecified: Secondary | ICD-10-CM | POA: Diagnosis not present

## 2023-07-23 DIAGNOSIS — D45 Polycythemia vera: Secondary | ICD-10-CM | POA: Diagnosis not present

## 2023-07-23 DIAGNOSIS — E039 Hypothyroidism, unspecified: Secondary | ICD-10-CM | POA: Insufficient documentation

## 2023-07-23 NOTE — Progress Notes (Signed)
TIERRIA, WATSON ([id number]) [id number] Page 1 of 10 Visit Report for 07/23/2023 Arrival Information Details Patient Name: Date of Service: [place], [name] 07/23/2023 11:15 A M Medical Record Number: [id number] Patient Account Number: [id number] Date of Birth/Sex: Treating RN: Sep 28, 1951 (72 y.o. F) Primary Care : Neale Burly Other Clinician: Referring : Treating /Extender: Jolyn Lent in Treatment: 10 Visit Information History Since Last Visit All ordered tests and consults were completed: No Patient Arrived: [name] Added or deleted any medications: No Arrival Time: 10:59 Any new allergies or adverse reactions: No Accompanied By: self Had a fall or experienced change in No Transfer Assistance: None activities of daily living that may affect Patient Identification Verified: Yes risk of falls: Secondary Verification Process Completed: Yes Signs or symptoms of abuse/neglect since last visito No Patient Requires Transmission-Based Precautions: No Hospitalized since last visit: No Patient Has Alerts: No Implantable device outside of the clinic excluding No cellular tissue based products placed in the center since last visit: Pain Present Now: No Electronic Signature(s) Signed: 07/23/2023 11:25:00 AM By: Dayton Scrape Entered By: Dayton Scrape on 07/23/2023 10:59:39 -------------------------------------------------------------------------------- Encounter Discharge Information Details Patient Name: Date of Service: [place], [place] [name] 07/23/2023 11:15 A M Medical Record Number: [id number] Patient Account Number: [id number] Date of Birth/Sex: Treating RN: [birth year] (72 y.o. [name] Primary Care : Neale Burly Other Clinician: Referring : Treating /Extender: Jolyn Lent in Treatment: 10 Encounter Discharge Information Items Post  Procedure Vitals Discharge Condition: Stable Temperature (F): 97.9 Ambulatory Status: Walker Pulse (bpm): 79 Discharge Destination: Home Respiratory Rate (breaths/min): 18 Transportation: Private Auto Blood Pressure (mmHg): 200/72 Accompanied By: self Schedule Follow-up Appointment: Yes Clinical Summary of Care: Patient Declined Electronic Signature(s) Signed: 07/23/2023 11:32:35 AM By: [name] Entered By: [name] on 07/23/2023 11:32:23 [name] ([id number]) [id number] Page 2 of 10 -------------------------------------------------------------------------------- Lower Extremity Assessment Details Patient Name: Date of Service: [place], [name] 07/23/2023 11:15 A M Medical Record Number: [id number] Patient Account Number: [id number] Date of Birth/Sex: Treating RN: Jan 02, 1951 (72 y.o. [name] Primary Care : Neale Burly Other Clinician: Referring : Treating /Extender: Jolyn Lent in Treatment: 10 Edema Assessment Assessed: Kyra Searles: No] [Right: No] Edema: [Left: N] [Right: o] Calf Left: Right: Point of Measurement: From Medial Instep 29.2 cm Ankle Left: Right: Point of Measurement: From Medial Instep 18.5 cm Vascular Assessment Pulses: Dorsalis Pedis Palpable: [Left:Yes] Extremity colors, hair growth, and conditions: Extremity Color: [Left:Normal] Temperature of Extremity: [Left:Warm] Capillary Refill: [Left:< 3 seconds] Dependent Rubor: [Left:No No] Electronic Signature(s) Signed: 07/23/2023 11:32:35 AM By: [name] Entered By: [name] on 07/23/2023 11:13:23 -------------------------------------------------------------------------------- Multi Wound Chart Details Patient Name: Date of Service: [name], [name] 07/23/2023 11:15 A M Medical Record Number: [id number] Patient Account Number: [id number] Date of Birth/Sex: Treating  RN: [birth year] (72 y.o. F) Primary Care : Neale Burly Other Clinician: Referring : Treating /Extender: Jolyn Lent in Treatment: 10 Vital Signs Height(in): 64 Pulse(bpm): 79 Weight(lbs): 138 Blood Pressure(mmHg): 200/72 Body Mass Index(BMI): 23.7 Temperature(F): 97.9 Respiratory Rate(breaths/min): 18 [19:Photos:] [[id number] Page 3 of 10] Left, Posterior Lower Leg Left, Lateral Lower Leg Left, Proximal, Lateral Lower Leg Wound Location: Blister Gradually Appeared Blister Wounding Event: Venous Leg Ulcer Venous Leg Ulcer Venous Leg Ulcer Primary Etiology: Glaucoma, Arrhythmia, Hypertension, Glaucoma, Arrhythmia, Hypertension, Glaucoma, Arrhythmia, Hypertension, Comorbid History: Peripheral Arterial Disease, Peripheral Peripheral Arterial Disease, Peripheral Peripheral Arterial Disease, Peripheral Venous Disease, Gout, Osteoarthritis  Venous Disease, Gout, Osteoarthritis Venous Disease, Gout, Osteoarthritis 02/19/2023 05/21/2023 05/22/2023 Date Acquired: 10 9 8  Weeks of Treatment: Open Open Open Wound Status: No No No Wound Recurrence: Yes No No Clustered Wound: 0.2x0.2x0.1 1x0.4x0.1 0.1x0.1x0.1 Measurements L x W x D (cm) 0.031 0.314 0.008 A (cm) : rea 0.003 0.031 0.001 Volume (cm) : 99.90% 56.10% 99.90% % Reduction in A rea: 99.90% 56.30% 99.90% % Reduction in Volume: Full Thickness Without Exposed Full Thickness Without Exposed Full Thickness Without Exposed Classification: Support Structures Support Structures Support Structures Medium Medium Medium Exudate A mount: Serosanguineous Serosanguineous Serosanguineous Exudate Type: red, brown red, brown red, brown Exudate Color: Distinct, outline attached Flat and Intact Distinct, outline attached Wound Margin: Medium (34-66%) Medium (34-66%) Large (67-100%) Granulation A mount: Red, Pink Red Red Granulation Quality: Medium  (34-66%) Medium (34-66%) Small (1-33%) Necrotic A mount: N/A Adherent Slough Eschar Necrotic Tissue: Fat Layer (Subcutaneous Tissue): Yes Fat Layer (Subcutaneous Tissue): Yes Fat Layer (Subcutaneous Tissue): Yes Exposed Structures: Fascia: No Fascia: No Fascia: No Tendon: No Tendon: No Tendon: No Muscle: No Muscle: No Muscle: No Joint: No Joint: No Joint: No Bone: No Bone: No Bone: No Medium (34-66%) Medium (34-66%) Large (67-100%) Epithelialization: Debridement - Selective/Open Wound Debridement - Selective/Open Wound Debridement - Selective/Open Wound Debridement: Pre-procedure Verification/Time Out 11:19 11:19 11:19 Taken: Lidocaine 4% Topical Solution Lidocaine 4% Topical Solution Lidocaine 4% Topical Solution Pain Control: Necrotic/Eschar, Ambulance person, Ambulance person Tissue Debrided: Non-Viable Tissue Non-Viable Tissue Non-Viable Tissue Level: 0.03 0.31 0.01 Debridement A (sq cm): rea Curette Curette Curette Instrument: Minimum Minimum Minimum Bleeding: Pressure Pressure Pressure Hemostasis A chieved: Procedure was tolerated well Procedure was tolerated well Procedure was tolerated well Debridement Treatment Response: 0.2x0.2x0.1 1x0.4x0.1 0.1x0.1x0.1 Post Debridement Measurements L x W x D (cm) 0.003 0.031 0.001 Post Debridement Volume: (cm) Scarring: Yes No Abnormalities Noted Scarring: No Periwound Skin Texture: Maceration: No No Abnormalities Noted Maceration: No Periwound Skin Moisture: Dry/Scaly: No Dry/Scaly: No Hemosiderin Staining: Yes Hemosiderin Staining: Yes Hemosiderin Staining: Yes Periwound Skin Color: No Abnormality No Abnormality No Abnormality Temperature: Yes Yes Yes Tenderness on Palpation: Debridement Debridement Debridement Procedures Performed: Treatment Notes Electronic Signature(s) Signed: 07/23/2023 11:21:54 AM By: Duanne Guess MD FACS Entered By: Duanne Guess on 07/23/2023  11:21:54 -------------------------------------------------------------------------------- Multi-Disciplinary Care Plan Details Patient Name: Date of Service: [name], [place] [name] 07/23/2023 11:15 A M Medical Record Number: [id number] Patient Account Number: [id number] Date of Birth/Sex: Treating RN: 1951-03-12 (72 y.o. [name] [place]asket, Juliene B ([id number].pdf Page 4 of 10 Primary Care : Neale Burly Other Clinician: Referring : Treating /Extender: Jolyn Lent in Treatment: 10 Multidisciplinary Care Plan reviewed with physician Active Inactive Venous Leg Ulcer Nursing Diagnoses: Knowledge deficit related to disease process and management Potential for venous Insuffiency (use before diagnosis confirmed) Goals: Patient will maintain optimal edema control Date Initiated: 05/21/2023 Target Resolution Date: 08/20/2023 Goal Status: Active Interventions: Assess peripheral edema status every visit. Compression as ordered Provide education on venous insufficiency Treatment Activities: Therapeutic compression applied : 05/21/2023 Notes: Wound/Skin Impairment Nursing Diagnoses: Impaired tissue integrity Goals: Patient/caregiver will verbalize understanding of skin care regimen Date Initiated: 05/13/2023 Target Resolution Date: 08/20/2023 Goal Status: Active Interventions: Assess patient/caregiver ability to obtain necessary supplies Assess patient/caregiver ability to perform ulcer/skin care regimen upon admission and as needed Assess ulceration(s) every visit Provide education on ulcer and skin care Screen for HBO Treatment Activities: Skin care regimen initiated : 05/13/2023 Topical wound management initiated : 05/13/2023 Notes: Electronic Signature(s) Signed: 07/23/2023  11:32:35 AM By: Gelene Mink By: [name] on 07/23/2023  11:10:59 -------------------------------------------------------------------------------- Pain Assessment Details Patient Name: Date of Service: [place], [name] 07/23/2023 11:15 A M Medical Record Number: [id number] Patient Account Number: [id number] Date of Birth/Sex: Treating RN: 1951-07-01 (72 y.o. F) Primary Care : Neale Burly Other Clinician: Referring : Treating /Extender: Jolyn Lent in Treatment: [address] ZISSY, HAMLETT B ([id number]) [id number] Page 5 of 10 Location of Pain Severity and Description of Pain Patient Has Paino No Site Locations Pain Management and Medication Current Pain Management: Electronic Signature(s) Signed: 07/23/2023 11:25:00 AM By: Dayton Scrape Entered By: Dayton Scrape on 07/23/2023 11:00:07 -------------------------------------------------------------------------------- Patient/Caregiver Education Details Patient Name: Date of Service: [name], [name] 8/2/2024andnbsp11:15 A M Medical Record Number: [id number] Patient Account Number: [id number] Date of Birth/Gender: Treating RN: [birth year] (72 y.o. [name] Primary Care Physician: Neale Burly Other Clinician: Referring Physician: Treating Physician/Extender: Jolyn Lent in Treatment: 10 Education Assessment Education Provided To: Patient Education Topics Provided Wound/Skin Impairment: Methods: Explain/Verbal Responses: Reinforcements needed, State content correctly Electronic Signature(s) Signed: 07/23/2023 11:32:35 AM By: [name] Entered By: [name] on 07/23/2023 11:11:11 -------------------------------------------------------------------------------- Wound Assessment Details Patient Name: Date of Service: [place], [name] 07/23/2023 11:15 A CAMALA, TALWAR B ([id number]) [id number] Page 6 of 10 Medical  Record Number: [id number] Patient Account Number: [id number] Date of Birth/Sex: Treating RN: 1951-02-09 (72 y.o. [name] Primary Care : Neale Burly Other Clinician: Referring : Treating /Extender: Jolyn Lent in Treatment: 10 Wound Status Wound Number: 19 Primary Venous Leg Ulcer Etiology: Wound Location: Left, Posterior Lower Leg Wound Open Wounding Event: Blister Status: Date Acquired: 02/19/2023 Comorbid Glaucoma, Arrhythmia, Hypertension, Peripheral Arterial Disease, Weeks Of Treatment: 10 History: Peripheral Venous Disease, Gout, Osteoarthritis Clustered Wound: Yes Photos Wound Measurements Length: (cm) 0.2 Width: (cm) 0.2 Depth: (cm) 0.1 Area: (cm) 0.031 Volume: (cm) 0.003 % Reduction in Area: 99.9% % Reduction in Volume: 99.9% Epithelialization: Medium (34-66%) Tunneling: No Undermining: No Wound Description Classification: Full Thickness Without Exposed Support Structures Wound Margin: Distinct, outline attached Exudate Amount: Medium Exudate Type: Serosanguineous Exudate Color: red, brown Foul Odor After Cleansing: No Slough/Fibrino Yes Wound Bed Granulation Amount: Medium (34-66%) Exposed Structure Granulation Quality: Red, Pink Fascia Exposed: No Necrotic Amount: Medium (34-66%) Fat Layer (Subcutaneous Tissue) Exposed: Yes Tendon Exposed: No Muscle Exposed: No Joint Exposed: No Bone Exposed: No Periwound Skin Texture Texture Color No Abnormalities Noted: Yes No Abnormalities Noted: No Hemosiderin Staining: Yes Moisture No Abnormalities Noted: No Temperature / Pain Dry / Scaly: No Temperature: No Abnormality Maceration: No Tenderness on Palpation: Yes Treatment Notes Wound #19 (Lower Leg) Wound Laterality: Left, Posterior Cleanser Vashe 5.8 (oz) Discharge Instruction: Cleanse the wound with Vashe prior to applying a clean dressing using gauze sponges, not tissue or cotton  balls. Peri-Wound Care Triamcinolone 15 (g) Discharge Instruction: Use triamcinolone 15 (g) as directed Sween Lotion (Moisturizing lotion) Discharge Instruction: Apply moisturizing lotion as directed MIRRA, BASILIO B ([id number]) [phone].pdf Page 7 of 10 Topical Primary Dressing Maxorb Extra Ag+ Alginate Dressing, 4x4.75 (in/in) Discharge Instruction: Apply to wound bed as instructed Secondary Dressing ABD Pad, 8x10 Discharge Instruction: Apply over primary dressing as directed. Secured With Compression Wrap Kerlix Roll 4.5x3.1 (in/yd) Discharge Instruction: Apply Kerlix and Coban compression as directed. Coban Self-Adherent Wrap 4x5 (in/yd) Discharge Instruction: Apply over Kerlix as directed. Compression Stockings Add-Ons Electronic Signature(s) Signed: 07/23/2023 11:32:35 AM By: [name] Entered By: Ander Slade  [name] on 07/23/2023 11:15:29 -------------------------------------------------------------------------------- Wound Assessment Details Patient Name: Date of Service: [place], [name] 07/23/2023 11:15 A M Medical Record Number: [id number] Patient Account Number: [id number] Date of Birth/Sex: Treating RN: 1951-06-11 (72 y.o. F) Primary Care : Neale Burly Other Clinician: Referring : Treating /Extender: Jolyn Lent in Treatment: 10 Wound Status Wound Number: 21 Primary Venous Leg Ulcer Etiology: Wound Location: Left, Lateral Lower Leg Wound Open Wounding Event: Gradually Appeared Status: Date Acquired: 05/21/2023 Comorbid Glaucoma, Arrhythmia, Hypertension, Peripheral Arterial Disease, Weeks Of Treatment: 9 History: Peripheral Venous Disease, Gout, Osteoarthritis Clustered Wound: No Photos Wound Measurements Length: (cm) 1 Width: (cm) 0.4 Depth: (cm) 0.1 Area: (cm) 0.314 Volume: (cm) 0.031 % Reduction in Area: 56.1% % Reduction in Volume: 56.3% Epithelialization:  Medium (34-66%) Tunneling: No Undermining: No Wound Description Classification: Full Thickness Without Exposed Support Structures CHARLIEE, KRENZ B ([id number]) Wound Margin: Flat and Intact Exudate Amount: Medium Exudate Type: Serosanguineous Exudate Color: red, brown Foul Odor After Cleansing: No [id number] Page 8 of 10 Slough/Fibrino Yes Wound Bed Granulation Amount: Medium (34-66%) Exposed Structure Granulation Quality: Red Fascia Exposed: No Necrotic Amount: Medium (34-66%) Fat Layer (Subcutaneous Tissue) Exposed: Yes Necrotic Quality: Adherent Slough Tendon Exposed: No Muscle Exposed: No Joint Exposed: No Bone Exposed: No Periwound Skin Texture Texture Color No Abnormalities Noted: Yes No Abnormalities Noted: No Hemosiderin Staining: Yes Moisture No Abnormalities Noted: Yes Temperature / Pain Temperature: No Abnormality Tenderness on Palpation: Yes Treatment Notes Wound #21 (Lower Leg) Wound Laterality: Left, Lateral Cleanser Vashe 5.8 (oz) Discharge Instruction: Cleanse the wound with Vashe prior to applying a clean dressing using gauze sponges, not tissue or cotton balls. Peri-Wound Care Triamcinolone 15 (g) Discharge Instruction: Use triamcinolone 15 (g) as directed Sween Lotion (Moisturizing lotion) Discharge Instruction: Apply moisturizing lotion as directed Topical Gentamicin Discharge Instruction: As directed by physician Mupirocin Ointment Discharge Instruction: Apply Mupirocin (Bactroban) as instructed Primary Dressing Maxorb Extra Ag+ Alginate Dressing, 4x4.75 (in/in) Discharge Instruction: Apply to wound bed as instructed Secondary Dressing ABD Pad, 8x10 Discharge Instruction: Apply over primary dressing as directed. Secured With Compression Wrap Kerlix Roll 4.5x3.1 (in/yd) Discharge Instruction: Apply Kerlix and Coban compression as directed. Coban Self-Adherent Wrap 4x5 (in/yd) Discharge Instruction: Apply over  Kerlix as directed. Compression Stockings Add-Ons Electronic Signature(s) Signed: 07/23/2023 11:32:35 AM By: [name] Entered By: [name] on 07/23/2023 11:14:43 [name] ([id number]) [id number] Page 9 of 10 -------------------------------------------------------------------------------- Wound Assessment Details Patient Name: Date of Service: [place], [name] 07/23/2023 11:15 A M Medical Record Number: [id number] Patient Account Number: [id number] Date of Birth/Sex: Treating RN: Apr 28, 1951 (72 y.o. F) Primary Care : Neale Burly Other Clinician: Referring : Treating /Extender: Jolyn Lent in Treatment: 10 Wound Status Wound Number: 22 Primary Venous Leg Ulcer Etiology: Wound Location: Left, Proximal, Lateral Lower Leg Wound Open Wounding Event: Blister Status: Date Acquired: 05/22/2023 Comorbid Glaucoma, Arrhythmia, Hypertension, Peripheral Arterial Disease, Weeks Of Treatment: 8 History: Peripheral Venous Disease, Gout, Osteoarthritis Clustered Wound: No Photos Wound Measurements Length: (cm) 0 Width: (cm) 0 Depth: (cm) 0 Area: (cm) Volume: (cm) .1 % Reduction in Area: 99.9% .1 % Reduction in Volume: 99.9% .1 Epithelialization: Large (67-100%) 0.008 Tunneling: No 0.001 Undermining: No Wound Description Classification: Full Thickness Without Exposed Suppor Wound Margin: Distinct, outline attached Exudate Amount: Medium Exudate Type: Serosanguineous Exudate Color: red, brown t Structures Foul Odor After Cleansing: No Slough/Fibrino No Wound Bed Granulation Amount: Large (67-100%) Exposed Structure Granulation Quality: Red Fascia Exposed: No Necrotic  Amount: Small (1-33%) Fat Layer (Subcutaneous Tissue) Exposed: Yes Necrotic Quality: Eschar Tendon Exposed: No Muscle Exposed: No Joint Exposed: No Bone Exposed: No Periwound Skin Texture Texture Color No  Abnormalities Noted: Yes No Abnormalities Noted: No Hemosiderin Staining: Yes Moisture No Abnormalities Noted: No Temperature / Pain Dry / Scaly: No Temperature: No Abnormality Maceration: No Tenderness on Palpation: Yes Treatment Notes Wound #22 (Lower Leg) Wound Laterality: Left, Lateral, Proximal Cleanser MALAY, FANTROY B ([id number]) [id number] Page 10 of 10 Vashe 5.8 (oz) Discharge Instruction: Cleanse the wound with Vashe prior to applying a clean dressing using gauze sponges, not tissue or cotton balls. Peri-Wound Care Triamcinolone 15 (g) Discharge Instruction: Use triamcinolone 15 (g) as directed Sween Lotion (Moisturizing lotion) Discharge Instruction: Apply moisturizing lotion as directed Topical Gentamicin Discharge Instruction: As directed by physician Mupirocin Ointment Discharge Instruction: Apply Mupirocin (Bactroban) as instructed Primary Dressing Maxorb Extra Ag+ Alginate Dressing, 4x4.75 (in/in) Discharge Instruction: Apply to wound bed as instructed Secondary Dressing ABD Pad, 8x10 Discharge Instruction: Apply over primary dressing as directed. Secured With Compression Wrap Kerlix Roll 4.5x3.1 (in/yd) Discharge Instruction: Apply Kerlix and Coban compression as directed. Coban Self-Adherent Wrap 4x5 (in/yd) Discharge Instruction: Apply over Kerlix as directed. Compression Stockings Add-Ons Electronic Signature(s) Signed: 07/23/2023 11:32:35 AM By: [name] Entered By: [name] on 07/23/2023 11:15:57 -------------------------------------------------------------------------------- Vitals Details Patient Name: Date of Service: [name], [name] 07/23/2023 11:15 A M Medical Record Number: [id number] Patient Account Number: [id number] Date of Birth/Sex: Treating RN: [birth year] (72 y.o. F) Primary Care : Neale Burly Other Clinician: Referring : Treating /Extender: Jolyn Lent in Treatment: 10 Vital Signs Time Taken: 10:59 Temperature (F): 97.9 Height (in): 64 Pulse (bpm): 79 Weight (lbs): 138 Respiratory Rate (breaths/min): 18 Body Mass Index (BMI): 23.7 Blood Pressure (mmHg): 200/72 Reference Range: 80 - 120 mg / dl Electronic Signature(s) Signed: 07/23/2023 11:25:00 AM By: Dayton Scrape Entered By: Dayton Scrape on 07/23/2023 11:00:00
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
## 2023-07-26 ENCOUNTER — Other Ambulatory Visit: Payer: Self-pay | Admitting: Family Medicine

## 2023-07-26 DIAGNOSIS — K219 Gastro-esophageal reflux disease without esophagitis: Secondary | ICD-10-CM

## 2023-07-29 ENCOUNTER — Encounter (HOSPITAL_BASED_OUTPATIENT_CLINIC_OR_DEPARTMENT_OTHER): Payer: 59 | Admitting: General Surgery

## 2023-07-29 DIAGNOSIS — L97822 Non-pressure chronic ulcer of other part of left lower leg with fat layer exposed: Secondary | ICD-10-CM | POA: Diagnosis not present

## 2023-07-29 NOTE — Progress Notes (Signed)
ANDRELL, ABDOU ([id number]) [id number] Page 1 of 10 Visit Report for 07/29/2023 Arrival Information Details Patient Name: Date of Service: [place], [name] 07/29/2023 10:15 A M Medical Record Number: [id number] Patient Account Number: [id number] Date of Birth/Sex: Treating RN: 1950/12/23 (72 y.o. [name], [name] Primary Care : Neale Burly Other Clinician: Referring : Treating /Extender: Jolyn Lent in Treatment: 11 Visit Information History Since Last Visit Added or deleted any medications: No Patient Arrived: [name] Any new allergies or adverse reactions: No Arrival Time: 09:43 Had a fall or experienced change in No Accompanied By: self activities of daily living that may affect Transfer Assistance: None risk of falls: Patient Identification Verified: Yes Signs or symptoms of abuse/neglect since last visito No Secondary Verification Process Completed: Yes Hospitalized since last visit: No Patient Requires Transmission-Based Precautions: No Implantable device outside of the clinic excluding No Patient Has Alerts: No cellular tissue based products placed in the center since last visit: Has Dressing in Place as Prescribed: Yes Has Compression in Place as Prescribed: Yes Pain Present Now: No Electronic Signature(s) Signed: 07/29/2023 11:59:38 AM By: [name] Entered By: [name] on 07/29/2023 09:45:15 -------------------------------------------------------------------------------- Encounter Discharge Information Details Patient Name: Date of Service: [name], [name] 07/29/2023 10:15 A M Medical Record Number: [id number] Patient Account Number: [id number] Date of Birth/Sex: Treating RN: 1951-12-10 (72 y.o. [name] Primary Care : Neale Burly Other Clinician: Referring : Treating /Extender: Jolyn Lent  in Treatment: 11 Encounter Discharge Information Items Post Procedure Vitals Discharge Condition: Stable Temperature (F): 98.3 Ambulatory Status: Walker Pulse (bpm): 91 Discharge Destination: Home Respiratory Rate (breaths/min): 18 Transportation: Private Auto Blood Pressure (mmHg): 85/54 Accompanied By: self Schedule Follow-up Appointment: Yes Clinical Summary of Care: Patient Declined Electronic Signature(s) Signed: 07/29/2023 11:59:38 AM By: [name] Entered By: [name] on 07/29/2023 10:10:13 [name] ([id number]) [id number] Page 2 of 10 -------------------------------------------------------------------------------- Lower Extremity Assessment Details Patient Name: Date of Service: [place], [name] 07/29/2023 10:15 A M Medical Record Number: [id number] Patient Account Number: [id number] Date of Birth/Sex: Treating RN: March 29, 1951 (72 y.o. [name] Primary Care : Neale Burly Other Clinician: Referring : Treating /Extender: Jolyn Lent in Treatment: 11 Edema Assessment Assessed: [Left: No] [Right: No] Edema: [Left: N] [Right: o] Calf Left: Right: Point of Measurement: From Medial Instep 28.5 cm Ankle Left: Right: Point of Measurement: From Medial Instep 17.9 cm Vascular Assessment Pulses: Dorsalis Pedis Palpable: [Left:Yes] Extremity colors, hair growth, and conditions: Extremity Color: [Left:Normal] Temperature of Extremity: [Left:Warm] Capillary Refill: [Left:< 3 seconds] Dependent Rubor: [Left:No No] Electronic Signature(s) Signed: 07/29/2023 11:59:38 AM By: [name] Entered By: [name] on 07/29/2023 09:51:03 -------------------------------------------------------------------------------- Multi Wound Chart Details Patient Name: Date of Service: [name], [name] 07/29/2023 10:15 A M Medical Record Number: [id number] Patient  Account Number: [id number] Date of Birth/Sex: Treating RN: 08-Jan-1951 (72 y.o. F) Primary Care : Neale Burly Other Clinician: Referring : Treating /Extender: Jolyn Lent in Treatment: 11 Vital Signs Height(in): 64 Pulse(bpm): 91 Weight(lbs): 138 Blood Pressure(mmHg): 82/54 Body Mass Index(BMI): 23.7 Temperature(F): 98.3 Respiratory Rate(breaths/min): 18 [19:Photos:] [id number] Page 3 of 10] Left, Posterior Lower Leg Left, Lateral Lower Leg Left, Proximal, Lateral Lower Leg Wound Location: Blister Gradually Appeared Blister Wounding Event: Venous Leg Ulcer Venous Leg Ulcer Venous Leg Ulcer Primary Etiology: Glaucoma, Arrhythmia, Hypertension, Glaucoma, Arrhythmia, Hypertension, Glaucoma, Arrhythmia, Hypertension, Comorbid History: Peripheral Arterial Disease, Peripheral Peripheral Arterial Disease, Peripheral  Peripheral Arterial Disease, Peripheral Venous Disease, Gout, Osteoarthritis Venous Disease, Gout, Osteoarthritis Venous Disease, Gout, Osteoarthritis 02/19/2023 05/21/2023 05/22/2023 Date Acquired: 11 9 9  Weeks of Treatment: Open Open Open Wound Status: No No No Wound Recurrence: Yes No No Clustered Wound: 0.5x0.3x0.1 0.5x0.3x0.1 0x0x0 Measurements L x W x D (cm) 0.118 0.118 0 A (cm) : rea 0.012 0.012 0 Volume (cm) : 99.60% 83.50% 100.00% % Reduction in A rea: 99.60% 83.10% 100.00% % Reduction in Volume: Full Thickness Without Exposed Full Thickness Without Exposed Full Thickness Without Exposed Classification: Support Structures Support Structures Support Structures Medium Medium None Present Exudate A mount: Serosanguineous Serosanguineous N/A Exudate Type: red, brown red, brown N/A Exudate Color: Distinct, outline attached Flat and Intact Distinct, outline attached Wound Margin: Medium (34-66%) Medium (34-66%) None Present (0%) Granulation A mount: Red, Pink Red  N/A Granulation Quality: Medium (34-66%) Medium (34-66%) None Present (0%) Necrotic A mount: Fat Layer (Subcutaneous Tissue): Yes Fat Layer (Subcutaneous Tissue): Yes Fascia: No Exposed Structures: Fascia: No Fascia: No Fat Layer (Subcutaneous Tissue): No Tendon: No Tendon: No Tendon: No Muscle: No Muscle: No Muscle: No Joint: No Joint: No Joint: No Bone: No Bone: No Bone: No Medium (34-66%) Medium (34-66%) Large (67-100%) Epithelialization: Debridement - Selective/Open Wound Debridement - Selective/Open Wound N/A Debridement: Pre-procedure Verification/Time Out 09:57 09:57 N/A Taken: Lidocaine 4% Topical Solution Lidocaine 4% Topical Solution N/A Pain Control: [employer] N/A Tissue Debrided: Non-Viable Tissue Non-Viable Tissue N/A Level: 0.12 0.12 N/A Debridement A (sq cm): rea Curette Curette N/A Instrument: Minimum Minimum N/A Bleeding: Pressure Pressure N/A Hemostasis A chieved: Procedure was tolerated well Procedure was tolerated well N/A Debridement Treatment Response: 0.5x0.3x0.1 0.5x0.3x0.1 N/A Post Debridement Measurements L x W x D (cm) 0.012 0.012 N/A Post Debridement Volume: (cm) Scarring: Yes No Abnormalities Noted Scarring: No Periwound Skin Texture: Maceration: No No Abnormalities Noted Maceration: No Periwound Skin Moisture: Dry/Scaly: No Dry/Scaly: No Hemosiderin Staining: Yes Hemosiderin Staining: Yes Hemosiderin Staining: Yes Periwound Skin Color: No Abnormality No Abnormality No Abnormality Temperature: Yes Yes N/A Tenderness on Palpation: Debridement Debridement N/A Procedures Performed: Treatment Notes Electronic Signature(s) Signed: 07/29/2023 10:01:17 AM By: Duanne Guess MD FACS Entered By: Duanne Guess on 07/29/2023 10:01:17 -------------------------------------------------------------------------------- Multi-Disciplinary Care Plan Details Patient Name: Date of Service: [place], [place] [name] 07/29/2023 10:15 A  M Medical Record Number: [id number] Patient Account Number: [id number] Date of Birth/Sex: Treating RN: 09-Jun-1951 (72 y.o. [name] Primary Care : Neale Burly Other Clinician: [name] ([id number]) [id number] Page 4 of 10 Referring : Treating /Extender: Jolyn Lent in Treatment: 11 Multidisciplinary Care Plan reviewed with physician Active Inactive Venous Leg Ulcer Nursing Diagnoses: Knowledge deficit related to disease process and management Potential for venous Insuffiency (use before diagnosis confirmed) Goals: Patient will maintain optimal edema control Date Initiated: 05/21/2023 Target Resolution Date: 08/20/2023 Goal Status: Active Interventions: Assess peripheral edema status every visit. Compression as ordered Provide education on venous insufficiency Treatment Activities: Therapeutic compression applied : 05/21/2023 Notes: Wound/Skin Impairment Nursing Diagnoses: Impaired tissue integrity Goals: Patient/caregiver will verbalize understanding of skin care regimen Date Initiated: 05/13/2023 Target Resolution Date: 08/20/2023 Goal Status: Active Interventions: Assess patient/caregiver ability to obtain necessary supplies Assess patient/caregiver ability to perform ulcer/skin care regimen upon admission and as needed Assess ulceration(s) every visit Provide education on ulcer and skin care Screen for HBO Treatment Activities: Skin care regimen initiated : 05/13/2023 Topical wound management initiated : 05/13/2023 Notes: Electronic Signature(s) Signed: 07/29/2023 11:59:38 AM By: [name] Entered By: Ander Slade  [name] on 07/29/2023 10:09:19 -------------------------------------------------------------------------------- Pain Assessment Details Patient Name: Date of Service: [place], [name] 07/29/2023 10:15 A M Medical Record Number: [id number] Patient  Account Number: [id number] Date of Birth/Sex: Treating RN: 08-18-1951 (72 y.o. [name] Primary Care : Neale Burly Other Clinician: Referring : Treating /Extender: Jolyn Lent in Treatment: [address] TALISSA, LAHTI B ([id number]) [id number] Page 5 of 10 Location of Pain Severity and Description of Pain Patient Has Paino No Site Locations Rate the pain. Current Pain Level: 0 Pain Management and Medication Current Pain Management: Electronic Signature(s) Signed: 07/29/2023 11:59:38 AM By: [name] Entered By: [name] on 07/29/2023 09:45:35 -------------------------------------------------------------------------------- Patient/Caregiver Education Details Patient Name: Date of Service: [place], [name] 8/8/2024andnbsp10:15 A M Medical Record Number: [id number] Patient Account Number: [id number] Date of Birth/Gender: Treating RN: 09-12-1951 (72 y.o. [name] Primary Care Physician: Neale Burly Other Clinician: Referring Physician: Treating Physician/Extender: Jolyn Lent in Treatment: 11 Education Assessment Education Provided To: Patient Education Topics Provided Wound/Skin Impairment: Methods: Explain/Verbal Responses: Reinforcements needed, State content correctly Electronic Signature(s) Signed: 07/29/2023 11:59:38 AM By: [name] Entered By: [name] on 07/29/2023 10:09:32 -------------------------------------------------------------------------------- Wound Assessment Details Patient Name: Date of Service: [place], [name] 07/29/2023 10:15 A Lamont Snowball ([id number]) [id number] Page 6 of 10 Medical Record Number: [id number] Patient Account Number: [id number] Date of Birth/Sex: Treating RN: Sep 13, 1951 (72 y.o. [name] Primary Care :  Neale Burly Other Clinician: Referring : Treating /Extender: Jolyn Lent in Treatment: 11 Wound Status Wound Number: 19 Primary Venous Leg Ulcer Etiology: Wound Location: Left, Posterior Lower Leg Wound Open Wounding Event: Blister Status: Date Acquired: 02/19/2023 Comorbid Glaucoma, Arrhythmia, Hypertension, Peripheral Arterial Disease, Weeks Of Treatment: 11 History: Peripheral Venous Disease, Gout, Osteoarthritis Clustered Wound: Yes Photos Wound Measurements Length: (cm) 0.5 Width: (cm) 0.3 Depth: (cm) 0.1 Area: (cm) 0.118 Volume: (cm) 0.012 % Reduction in Area: 99.6% % Reduction in Volume: 99.6% Epithelialization: Medium (34-66%) Tunneling: No Undermining: No Wound Description Classification: Full Thickness Without Exposed Support Structures Wound Margin: Distinct, outline attached Exudate Amount: Medium Exudate Type: Serosanguineous Exudate Color: red, brown Foul Odor After Cleansing: No Slough/Fibrino Yes Wound Bed Granulation Amount: Medium (34-66%) Exposed Structure Granulation Quality: Red, Pink Fascia Exposed: No Necrotic Amount: Medium (34-66%) Fat Layer (Subcutaneous Tissue) Exposed: Yes Necrotic Quality: Adherent Slough Tendon Exposed: No Muscle Exposed: No Joint Exposed: No Bone Exposed: No Periwound Skin Texture Texture Color No Abnormalities Noted: Yes No Abnormalities Noted: No Hemosiderin Staining: Yes Moisture No Abnormalities Noted: No Temperature / Pain Dry / Scaly: No Temperature: No Abnormality Maceration: No Tenderness on Palpation: Yes Treatment Notes Wound #19 (Lower Leg) Wound Laterality: Left, Posterior Cleanser Vashe 5.8 (oz) Discharge Instruction: Cleanse the wound with Vashe prior to applying a clean dressing using gauze sponges, not tissue or cotton balls. Peri-Wound Care Triamcinolone 15 (g) Discharge Instruction: Use triamcinolone 15 (g) as directed Sween Lotion  (Moisturizing lotion) Discharge Instruction: Apply moisturizing lotion as directed GENETA, MUDGE ([id number]) [phone].pdf Page 7 of 10 Topical Gentamicin Discharge Instruction: As directed by physician Mupirocin Ointment Discharge Instruction: Apply Mupirocin (Bactroban) as instructed Primary Dressing Maxorb Extra Ag+ Alginate Dressing, 2x2 (in/in) Discharge Instruction: Apply to wound bed as instructed Secondary Dressing Woven Gauze Sponge, Non-Sterile 4x4 in Discharge Instruction: Apply over primary dressing as directed. Secured With Compression Wrap Kerlix Roll 4.5x3.1 (in/yd) Discharge Instruction: Apply Kerlix and Coban compression as directed. Coban Self-Adherent Wrap  4x5 (in/yd) Discharge Instruction: Apply over Kerlix as directed. Compression Stockings Add-Ons Electronic Signature(s) Signed: 07/29/2023 11:59:38 AM By: [name] Entered By: [name] on 07/29/2023 09:53:41 -------------------------------------------------------------------------------- Wound Assessment Details Patient Name: Date of Service: [name], [name] 07/29/2023 10:15 A M Medical Record Number: [id number] Patient Account Number: [id number] Date of Birth/Sex: Treating RN: June 08, 1951 (72 y.o. [name] Primary Care : Neale Burly Other Clinician: Referring : Treating /Extender: Jolyn Lent in Treatment: 11 Wound Status Wound Number: 21 Primary Venous Leg Ulcer Etiology: Wound Location: Left, Lateral Lower Leg Wound Open Wounding Event: Gradually Appeared Status: Date Acquired: 05/21/2023 Comorbid Glaucoma, Arrhythmia, Hypertension, Peripheral Arterial Disease, Weeks Of Treatment: 9 History: Peripheral Venous Disease, Gout, Osteoarthritis Clustered Wound: No Photos Wound Measurements Length: (cm) 0.5 Width: (cm) 0.3 Depth: (cm) 0.1 Area: (cm) 0.118 Berrones, Dennette B  ([id number]) Volume: (cm) 0.012 % Reduction in Area: 83.5% % Reduction in Volume: 83.1% Epithelialization: Medium (34-66%) Tunneling: No [id number] Page 8 of 10 Undermining: No Wound Description Classification: Full Thickness Without Exposed Support Structures Wound Margin: Flat and Intact Exudate Amount: Medium Exudate Type: Serosanguineous Exudate Color: red, brown Foul Odor After Cleansing: No Slough/Fibrino Yes Wound Bed Granulation Amount: Medium (34-66%) Exposed Structure Granulation Quality: Red Fascia Exposed: No Necrotic Amount: Medium (34-66%) Fat Layer (Subcutaneous Tissue) Exposed: Yes Necrotic Quality: Adherent Slough Tendon Exposed: No Muscle Exposed: No Joint Exposed: No Bone Exposed: No Periwound Skin Texture Texture Color No Abnormalities Noted: Yes No Abnormalities Noted: No Hemosiderin Staining: Yes Moisture No Abnormalities Noted: Yes Temperature / Pain Temperature: No Abnormality Tenderness on Palpation: Yes Treatment Notes Wound #21 (Lower Leg) Wound Laterality: Left, Lateral Cleanser Vashe 5.8 (oz) Discharge Instruction: Cleanse the wound with Vashe prior to applying a clean dressing using gauze sponges, not tissue or cotton balls. Peri-Wound Care Triamcinolone 15 (g) Discharge Instruction: Use triamcinolone 15 (g) as directed Sween Lotion (Moisturizing lotion) Discharge Instruction: Apply moisturizing lotion as directed Topical Gentamicin Discharge Instruction: As directed by physician Mupirocin Ointment Discharge Instruction: Apply Mupirocin (Bactroban) as instructed Primary Dressing Maxorb Extra Ag+ Alginate Dressing, 2x2 (in/in) Discharge Instruction: Apply to wound bed as instructed Secondary Dressing Woven Gauze Sponge, Non-Sterile 4x4 in Discharge Instruction: Apply over primary dressing as directed. Secured With Compression Wrap Kerlix Roll 4.5x3.1 (in/yd) Discharge Instruction: Apply Kerlix and Coban  compression as directed. Coban Self-Adherent Wrap 4x5 (in/yd) Discharge Instruction: Apply over Kerlix as directed. Compression Stockings Add-Ons Electronic Signature(s) Signed: 07/29/2023 11:59:38 AM By: [name] Entered By: [name] on 07/29/2023 09:54:03 [name] ([id number]) [id number] Page 9 of 10 -------------------------------------------------------------------------------- Wound Assessment Details Patient Name: Date of Service: [place], [name] 07/29/2023 10:15 A M Medical Record Number: [id number] Patient Account Number: [id number] Date of Birth/Sex: Treating RN: 1951-03-03 (72 y.o. [name] Primary Care : Neale Burly Other Clinician: Referring : Treating /Extender: Jolyn Lent in Treatment: 11 Wound Status Wound Number: 22 Primary Venous Leg Ulcer Etiology: Wound Location: Left, Proximal, Lateral Lower Leg Wound Open Wounding Event: Blister Status: Date Acquired: 05/22/2023 Comorbid Glaucoma, Arrhythmia, Hypertension, Peripheral Arterial Disease, Weeks Of Treatment: 9 History: Peripheral Venous Disease, Gout, Osteoarthritis Clustered Wound: No Photos Wound Measurements Length: (cm) Width: (cm) Depth: (cm) Area: (cm) Volume: (cm) 0 % Reduction in Area: 100% 0 % Reduction in Volume: 100% 0 Epithelialization: Large (67-100%) 0 Tunneling: No 0 Undermining: No Wound Description Classification: Full Thickness Without Exposed Support Structures Wound Margin: Distinct, outline attached Exudate Amount: None Present Foul  Odor After Cleansing: No Slough/Fibrino No Wound Bed Granulation Amount: None Present (0%) Exposed Structure Necrotic Amount: None Present (0%) Fascia Exposed: No Fat Layer (Subcutaneous Tissue) Exposed: No Tendon Exposed: No Muscle Exposed: No Joint Exposed: No Bone Exposed: No Periwound Skin Texture Texture Color No  Abnormalities Noted: Yes No Abnormalities Noted: No Hemosiderin Staining: Yes Moisture No Abnormalities Noted: No Temperature / Pain Dry / Scaly: No Temperature: No Abnormality Maceration: No Electronic Signature(s) Signed: 07/29/2023 11:59:38 AM By: [name] Entered By: [name] on 07/29/2023 09:54:26 [name] ([id number]) [id number] Page 10 of 10 -------------------------------------------------------------------------------- Vitals Details Patient Name: Date of Service: [place], [name] 07/29/2023 10:15 A M Medical Record Number: [id number] Patient Account Number: [id number] Date of Birth/Sex: Treating RN: Jun 01, 1951 (72 y.o. [name] Primary Care : Neale Burly Other Clinician: Referring : Treating /Extender: Jolyn Lent in Treatment: 11 Vital Signs Time Taken: 09:45 Temperature (F): 98.3 Height (in): 64 Pulse (bpm): 91 Weight (lbs): 138 Respiratory Rate (breaths/min): 18 Body Mass Index (BMI): 23.7 Blood Pressure (mmHg): 82/54 Reference Range: 80 - 120 mg / dl Electronic Signature(s) Signed: 07/29/2023 11:59:38 AM By: [name] Entered By: [name] on 07/29/2023 09:45:29
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
## 2023-08-07 ENCOUNTER — Other Ambulatory Visit: Payer: Self-pay | Admitting: Family Medicine

## 2023-08-07 DIAGNOSIS — I482 Chronic atrial fibrillation, unspecified: Secondary | ICD-10-CM

## 2023-08-07 DIAGNOSIS — Z8673 Personal history of transient ischemic attack (TIA), and cerebral infarction without residual deficits: Secondary | ICD-10-CM

## 2023-08-07 DIAGNOSIS — M1712 Unilateral primary osteoarthritis, left knee: Secondary | ICD-10-CM

## 2023-08-07 DIAGNOSIS — I10 Essential (primary) hypertension: Secondary | ICD-10-CM

## 2023-08-07 DIAGNOSIS — I214 Non-ST elevation (NSTEMI) myocardial infarction: Secondary | ICD-10-CM

## 2023-08-07 DIAGNOSIS — E782 Mixed hyperlipidemia: Secondary | ICD-10-CM

## 2023-08-09 ENCOUNTER — Encounter (HOSPITAL_BASED_OUTPATIENT_CLINIC_OR_DEPARTMENT_OTHER): Payer: 59 | Admitting: General Surgery

## 2023-08-09 DIAGNOSIS — L97822 Non-pressure chronic ulcer of other part of left lower leg with fat layer exposed: Secondary | ICD-10-CM | POA: Diagnosis not present

## 2023-08-09 NOTE — Progress Notes (Signed)
ALGA, NISSAN ([id number]) [id number] Page 1 of 9 Visit Report for 08/09/2023 Arrival Information Details Patient Name: Date of Service: [name] 08/09/2023 4:00 PM Medical Record Number: [id number] Patient Account Number: [id number] Date of Birth/Sex: Treating RN: [birth year] (72 y.o. [name], [name] Primary Care [name]: [name] Other Clinician: Referring [name]: Treating [name]/Extender: [name] in Treatment: 12 Visit Information History Since Last Visit Added or deleted any medications: No Patient Arrived: [name] Any new allergies or adverse reactions: No Arrival Time: 15:57 Had a fall or experienced change in No Accompanied By: self activities of daily living that may affect Transfer Assistance: None risk of falls: Patient Identification Verified: Yes Signs or symptoms of abuse/neglect since last visito No Secondary Verification Process Completed: Yes Hospitalized since last visit: No Patient Requires Transmission-Based Precautions: No Implantable device outside of the clinic excluding No Patient Has Alerts: No cellular tissue based products placed in the center since last visit: Pain Present Now: No Electronic Signature(s) Signed: 08/09/2023 5:11:31 PM By: Zenaida Deed RN, [name] Entered By: Zenaida Deed on 08/09/2023 13:00:28 -------------------------------------------------------------------------------- Encounter Discharge Information Details Patient Name: Date of Service: [name], [name] 08/09/2023 4:00 PM Medical Record Number: [id number] Patient Account Number: [id number] Date of Birth/Sex: Treating RN: August 19, 1951 (72 y.o. [name] Primary Care Kadden Osterhout: [name] Other Clinician: Referring [name]: Treating [name]/Extender: [name] in Treatment: 12 Encounter Discharge Information Items Post Procedure Vitals Discharge  Condition: Stable Temperature (F): 97.7 Ambulatory Status: Walker Pulse (bpm): 68 Discharge Destination: Home Respiratory Rate (breaths/min): 18 Transportation: Private Auto Blood Pressure (mmHg): 177/63 Accompanied By: self Schedule Follow-up Appointment: Yes Clinical Summary of Care: Patient Declined Electronic Signature(s) Signed: 08/09/2023 5:16:43 PM By: Karie Schwalbe RN Entered By: Karie Schwalbe on 08/09/2023 14:04:50 [name] ([id number]) [id number] Page 2 of 9 -------------------------------------------------------------------------------- Lower Extremity Assessment Details Patient Name: Date of Service: [name] 08/09/2023 4:00 PM Medical Record Number: [id number] Patient Account Number: [id number] Date of Birth/Sex: Treating RN: [birth year] (72 y.o. [name] Primary Care [name]: [name] Other Clinician: Referring [name]: Treating [name]/Extender: [name] in Treatment: 12 Edema Assessment Assessed: [Left: No] [Right: No] Edema: [Left: N] [Right: o] Calf Left: Right: Point of Measurement: From Medial Instep 27 cm Ankle Left: Right: Point of Measurement: From Medial Instep 17.5 cm Vascular Assessment Pulses: Dorsalis Pedis Palpable: [Left:Yes] Extremity colors, hair growth, and conditions: Extremity Color: [Left:Normal] Temperature of Extremity: [Left:Warm] Capillary Refill: [Left:< 3 seconds] Dependent Rubor: [Left:No No] Electronic Signature(s) Signed: 08/09/2023 5:11:31 PM By: Zenaida Deed RN, [name] Entered By: Zenaida Deed on 08/09/2023 13:05:38 -------------------------------------------------------------------------------- Multi Wound Chart Details Patient Name: Date of Service: Stacey [name], PA TSY [name] 08/09/2023 4:00 PM Medical Record Number: [id number] Patient Account Number: [id number] Date of Birth/Sex: Treating RN: [birth year] (72 y.o. F) Primary Care  [name]: [name] Other Clinician: Referring [name]: Treating [name]/Extender: [name] in Treatment: 12 Vital Signs Height(in): 64 Pulse(bpm): 68 Weight(lbs): 138 Blood Pressure(mmHg): 177/63 Body Mass Index(BMI): 23.7 Temperature(F): 97.7 Respiratory Rate(breaths/min): 18 [19:Photos:] [N/A:N/A [id number] Page 3 of 9] Left, Posterior Lower Leg Left, Lateral Lower Leg N/A Wound Location: Blister Gradually Appeared N/A Wounding Event: Venous Leg Ulcer Venous Leg Ulcer N/A Primary Etiology: Glaucoma, Arrhythmia, Hypertension, Glaucoma, Arrhythmia, Hypertension, N/A Comorbid History: Peripheral Arterial Disease, Peripheral Peripheral Arterial Disease, Peripheral Venous Disease, Gout, Osteoarthritis Venous Disease, Gout, Osteoarthritis 02/19/2023 05/21/2023 N/A Date Acquired: 12 11 N/A Weeks of Treatment: Open  Open N/A Wound Status: No No N/A Wound Recurrence: Yes No N/A Clustered Wound: 0.1x0.1x0.1 0.1x0.1x0.1 N/A Measurements L x W x D (cm) 0.008 0.008 N/A A (cm) : rea 0.001 0.001 N/A Volume (cm) : 100.00% 98.90% N/A % Reduction in A rea: 100.00% 98.60% N/A % Reduction in Volume: Full Thickness Without Exposed Full Thickness Without Exposed N/A Classification: Support Structures Support Structures None Present None Present N/A Exudate A mount: N/A Flat and Intact N/A Wound Margin: Medium (34-66%) Large (67-100%) N/A Granulation A mount: Red Red N/A Granulation Quality: Medium (34-66%) Small (1-33%) N/A Necrotic A mount: Eschar, Adherent Slough Eschar, Adherent Slough N/A Necrotic Tissue: Fat Layer (Subcutaneous Tissue): Yes Fat Layer (Subcutaneous Tissue): Yes N/A Exposed Structures: Fascia: No Fascia: No Tendon: No Tendon: No Muscle: No Muscle: No Joint: No Joint: No Bone: No Bone: No Large (67-100%) Large (67-100%) N/A Epithelialization: Debridement - Selective/Open Wound  Debridement - Selective/Open Wound N/A Debridement: Pre-procedure Verification/Time Out 16:15 16:15 N/A Taken: Lidocaine 4% Topical Solution Lidocaine 4% Topical Solution N/A Pain Control: Necrotic/Eschar Necrotic/Eschar N/A Tissue Debrided: Non-Viable Tissue Non-Viable Tissue N/A Level: 0.01 0.01 N/A Debridement A (sq cm): rea Curette Curette N/A Instrument: Minimum Minimum N/A Bleeding: Pressure Pressure N/A Hemostasis A chieved: 0 0 N/A Procedural Pain: 0 0 N/A Post Procedural Pain: Procedure was tolerated well Procedure was tolerated well N/A Debridement Treatment Response: 0.1x0.1x0.1 0.1x0.1x0.1 N/A Post Debridement Measurements L x W x D (cm) 0.001 0.001 N/A Post Debridement Volume: (cm) Scarring: Yes No Abnormalities Noted N/A Periwound Skin Texture: Maceration: No No Abnormalities Noted N/A Periwound Skin Moisture: Dry/Scaly: No Hemosiderin Staining: Yes Hemosiderin Staining: Yes N/A Periwound Skin Color: No Abnormality No Abnormality N/A Temperature: Yes Yes N/A Tenderness on Palpation: Debridement Debridement N/A Procedures Performed: Treatment Notes Electronic Signature(s) Signed: 08/09/2023 4:21:52 PM By: Duanne Guess MD FACS Entered By: Duanne Guess on 08/09/2023 13:21:52 -------------------------------------------------------------------------------- Multi-Disciplinary Care Plan Details Patient Name: Date of Service: Stacey [place], [name] 08/09/2023 4:00 PM Medical Record Number: [id number] Patient Account Number: [id number] Date of Birth/Sex: Treating RN: 1951-06-28 (72 y.o. [address], [place], [place] [name] ([id number]) [id number] Page 4 of 9 Primary Care Evangeline Utley: [name] Other Clinician: Referring Palestine Mosco: Treating Tamarick Kovalcik/Extender: [name] in Treatment: 12 Multidisciplinary Care Plan reviewed with physician Active Inactive Venous Leg Ulcer Nursing  Diagnoses: Knowledge deficit related to disease process and management Potential for venous Insuffiency (use before diagnosis confirmed) Goals: Patient will maintain optimal edema control Date Initiated: 05/21/2023 Target Resolution Date: 10/20/2023 Goal Status: Active Interventions: Assess peripheral edema status every visit. Compression as ordered Provide education on venous insufficiency Treatment Activities: Therapeutic compression applied : 05/21/2023 Notes: Wound/Skin Impairment Nursing Diagnoses: Impaired tissue integrity Goals: Patient/caregiver will verbalize understanding of skin care regimen Date Initiated: 05/13/2023 Target Resolution Date: 10/20/2023 Goal Status: Active Interventions: Assess patient/caregiver ability to obtain necessary supplies Assess patient/caregiver ability to perform ulcer/skin care regimen upon admission and as needed Assess ulceration(s) every visit Provide education on ulcer and skin care Screen for HBO Treatment Activities: Skin care regimen initiated : 05/13/2023 Topical wound management initiated : 05/13/2023 Notes: Electronic Signature(s) Signed: 08/09/2023 5:16:43 PM By: Karie Schwalbe RN Entered By: Karie Schwalbe on 08/09/2023 14:00:48 -------------------------------------------------------------------------------- Pain Assessment Details Patient Name: Date of Service: [name], [name] 08/09/2023 4:00 PM Medical Record Number: [id number] Patient Account Number: [id number] Date of Birth/Sex: Treating RN: 1951-02-14 (72 y.o. [name] Primary Care Keiston Manley: [name] Other Clinician: Referring Soraida Vickers: Treating Tahj Lindseth/Extender: Jeani Hawking  Weeks in Treatment: [address] [name], [name] ([id number]) [id number] Page 5 of 9 Location of Pain Severity and Description of Pain Patient Has Paino No Site Locations Rate the pain. Current Pain Level: 0 Pain  Management and Medication Current Pain Management: Electronic Signature(s) Signed: 08/09/2023 5:11:31 PM By: Zenaida Deed RN, [name] Entered By: Zenaida Deed on 08/09/2023 13:05:03 -------------------------------------------------------------------------------- Patient/Caregiver Education Details Patient Name: Date of Service: Stacey [name], PA TSY [name] 8/19/2024andnbsp4:00 PM Medical Record Number: [id number] Patient Account Number: [id number] Date of Birth/Gender: Treating RN: 1951-07-24 (72 y.o. [name] Primary Care Physician: [name] Other Clinician: Referring Physician: Treating Physician/Extender: [name] in Treatment: 12 Education Assessment Education Provided To: Patient Education Topics Provided Wound/Skin Impairment: Methods: Explain/Verbal Responses: Return demonstration correctly Electronic Signature(s) Signed: 08/09/2023 5:16:43 PM By: Karie Schwalbe RN Entered By: Karie Schwalbe on 08/09/2023 14:01:48 -------------------------------------------------------------------------------- Wound Assessment Details Patient Name: Date of Service: Stacey [name], PA TSY [name] 08/09/2023 4:00 PM [name] ([id number]) [id number] Page 6 of 9 Medical Record Number: [id number] Patient Account Number: [id number] Date of Birth/Sex: Treating RN: Jul 14, 1951 (72 y.o. [name], Bonita Quin Primary Care Zackry Deines: [name] Other Clinician: Referring Hadasah Brugger: Treating Wende Longstreth/Extender: [name] in Treatment: 12 Wound Status Wound Number: 19 Primary Venous Leg Ulcer Etiology: Wound Location: Left, Posterior Lower Leg Wound Open Wounding Event: Blister Status: Date Acquired: 02/19/2023 Comorbid Glaucoma, Arrhythmia, Hypertension, Peripheral Arterial Disease, Weeks Of Treatment: 12 History: Peripheral Venous Disease, Gout, Osteoarthritis Clustered Wound: Yes Photos Wound  Measurements Length: (cm) 0.1 Width: (cm) 0.1 Depth: (cm) 0.1 Area: (cm) 0.008 Volume: (cm) 0.001 % Reduction in Area: 100% % Reduction in Volume: 100% Epithelialization: Large (67-100%) Tunneling: No Undermining: No Wound Description Classification: Full Thickness Without Exposed Support Structures Exudate Amount: None Present Foul Odor After Cleansing: No Slough/Fibrino No Wound Bed Granulation Amount: Medium (34-66%) Exposed Structure Granulation Quality: Red Fascia Exposed: No Necrotic Amount: Medium (34-66%) Fat Layer (Subcutaneous Tissue) Exposed: Yes Necrotic Quality: Eschar, Adherent Slough Tendon Exposed: No Muscle Exposed: No Joint Exposed: No Bone Exposed: No Periwound Skin Texture Texture Color No Abnormalities Noted: Yes No Abnormalities Noted: No Hemosiderin Staining: Yes Moisture No Abnormalities Noted: No Temperature / Pain Dry / Scaly: No Temperature: No Abnormality Maceration: No Tenderness on Palpation: Yes Treatment Notes Wound #19 (Lower Leg) Wound Laterality: Left, Posterior Cleanser Vashe 5.8 (oz) Discharge Instruction: Cleanse the wound with Vashe prior to applying a clean dressing using gauze sponges, not tissue or cotton balls. Peri-Wound Care Triamcinolone 15 (g) Discharge Instruction: Use triamcinolone 15 (g) as directed Sween Lotion (Moisturizing lotion) Discharge Instruction: Apply moisturizing lotion as directed Topical [name], [name] ([id number]) [id number] Page 7 of 9 Gentamicin Discharge Instruction: As directed by physician Mupirocin Ointment Discharge Instruction: Apply Mupirocin (Bactroban) as instructed Primary Dressing Maxorb Extra Ag+ Alginate Dressing, 2x2 (in/in) Discharge Instruction: Apply to wound bed as instructed Secondary Dressing Woven Gauze Sponge, Non-Sterile 4x4 in Discharge Instruction: Apply over primary dressing as directed. Secured With Compression Wrap Kerlix Roll 4.5x3.1  (in/yd) Discharge Instruction: Apply Kerlix and Coban compression as directed. Coban Self-Adherent Wrap 4x5 (in/yd) Discharge Instruction: Apply over Kerlix as directed. Compression Stockings Add-Ons Electronic Signature(s) Signed: 08/09/2023 5:11:31 PM By: Zenaida Deed RN, [name] Signed: 08/09/2023 5:16:43 PM By: Karie Schwalbe RN Entered By: Karie Schwalbe on 08/09/2023 13:19:06 -------------------------------------------------------------------------------- Wound Assessment Details Patient Name: Date of Service: [name], [name] 08/09/2023 4:00 PM Medical Record Number: [id number] Patient Account Number: [id number] Date of Birth/Sex: Treating RN:  07-Apr-1951 (72 y.o. [name] Primary Care Marshae Azam: [name] Other Clinician: Referring Laira Penninger: Treating Rosangelica Pevehouse/Extender: [name] in Treatment: 12 Wound Status Wound Number: 21 Primary Venous Leg Ulcer Etiology: Wound Location: Left, Lateral Lower Leg Wound Open Wounding Event: Gradually Appeared Status: Date Acquired: 05/21/2023 Comorbid Glaucoma, Arrhythmia, Hypertension, Peripheral Arterial Disease, Weeks Of Treatment: 11 History: Peripheral Venous Disease, Gout, Osteoarthritis Clustered Wound: No Photos Wound Measurements Length: (cm) 0.1 Width: (cm) 0.1 Depth: (cm) 0.1 Area: (cm) 0.008 Swinson, [name] ([id number]) Volume: (cm) 0.001 % Reduction in Area: 98.9% % Reduction in Volume: 98.6% Epithelialization: Large (67-100%) Tunneling: No [id number] Page 8 of 9 Undermining: No Wound Description Classification: Full Thickness Without Exposed Support Structures Wound Margin: Flat and Intact Exudate Amount: None Present Foul Odor After Cleansing: No Slough/Fibrino No Wound Bed Granulation Amount: Large (67-100%) Exposed Structure Granulation Quality: Red Fascia Exposed: No Necrotic Amount: Small (1-33%) Fat Layer (Subcutaneous Tissue)  Exposed: Yes Necrotic Quality: Eschar, Adherent Slough Tendon Exposed: No Muscle Exposed: No Joint Exposed: No Bone Exposed: No Periwound Skin Texture Texture Color No Abnormalities Noted: Yes No Abnormalities Noted: No Hemosiderin Staining: Yes Moisture No Abnormalities Noted: Yes Temperature / Pain Temperature: No Abnormality Tenderness on Palpation: Yes Treatment Notes Wound #21 (Lower Leg) Wound Laterality: Left, Lateral Cleanser Vashe 5.8 (oz) Discharge Instruction: Cleanse the wound with Vashe prior to applying a clean dressing using gauze sponges, not tissue or cotton balls. Peri-Wound Care Triamcinolone 15 (g) Discharge Instruction: Use triamcinolone 15 (g) as directed Sween Lotion (Moisturizing lotion) Discharge Instruction: Apply moisturizing lotion as directed Topical Gentamicin Discharge Instruction: As directed by physician Mupirocin Ointment Discharge Instruction: Apply Mupirocin (Bactroban) as instructed Primary Dressing Maxorb Extra Ag+ Alginate Dressing, 2x2 (in/in) Discharge Instruction: Apply to wound bed as instructed Secondary Dressing Woven Gauze Sponge, Non-Sterile 4x4 in Discharge Instruction: Apply over primary dressing as directed. Secured With Compression Wrap Kerlix Roll 4.5x3.1 (in/yd) Discharge Instruction: Apply Kerlix and Coban compression as directed. Coban Self-Adherent Wrap 4x5 (in/yd) Discharge Instruction: Apply over Kerlix as directed. Compression Stockings Add-Ons Electronic Signature(s) Signed: 08/09/2023 5:11:31 PM By: Zenaida Deed RN, [name] Signed: 08/09/2023 5:16:43 PM By: Karie Schwalbe RN Entered By: Karie Schwalbe on 08/09/2023 13:19:24 [name] ([id number]) [id number] Page 9 of 9 -------------------------------------------------------------------------------- Vitals Details Patient Name: Date of Service: Stacey [place], [name] 08/09/2023 4:00 PM Medical Record Number: [id number] Patient  Account Number: [id number] Date of Birth/Sex: Treating RN: 1951-12-20 (72 y.o. [name] Primary Care [name]: [name] Other Clinician: Referring Arleene Settle: Treating Oreoluwa Gilmer/Extender: [name] in Treatment: 12 Vital Signs Time Taken: 16:00 Temperature (F): 97.7 Height (in): 64 Pulse (bpm): 68 Weight (lbs): 138 Respiratory Rate (breaths/min): 18 Body Mass Index (BMI): 23.7 Blood Pressure (mmHg): 177/63 Reference Range: 80 - 120 mg / dl Electronic Signature(s) Signed: 08/09/2023 5:11:31 PM By: Zenaida Deed RN, [name] Entered By: Zenaida Deed on 08/09/2023 13:01:29
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
## 2023-08-16 ENCOUNTER — Encounter (HOSPITAL_BASED_OUTPATIENT_CLINIC_OR_DEPARTMENT_OTHER): Payer: 59 | Admitting: General Surgery

## 2023-08-16 DIAGNOSIS — L97822 Non-pressure chronic ulcer of other part of left lower leg with fat layer exposed: Secondary | ICD-10-CM | POA: Diagnosis not present
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
## 2023-08-16 NOTE — Progress Notes (Signed)
[name], [name] ([id number]) [id number] Page 1 of 9 Visit Report for 08/16/2023 Arrival Information Details Patient Name: Date of Service: [name], [name] 08/16/2023 11:30 A M Medical Record Number: [id number] Patient Account Number: [id number] Date of Birth/Sex: Treating RN: [birth year] (72 y.o. [name], [name] Primary Care [name]: [name] Other Clinician: Referring [name]: Treating [name]/Extender: [name] in Treatment: 13 Visit Information History Since Last Visit Added or deleted any medications: No Patient Arrived: [name] Any new allergies or adverse reactions: No Arrival Time: 11:32 Had a fall or experienced change in No Accompanied By: self activities of daily living that may affect Transfer Assistance: None risk of falls: Patient Identification Verified: Yes Signs or symptoms of abuse/neglect since last visito No Secondary Verification Process Completed: Yes Hospitalized since last visit: No Patient Requires Transmission-Based Precautions: No Implantable device outside of the clinic excluding No Patient Has Alerts: No cellular tissue based products placed in the center since last visit: Has Dressing in Place as Prescribed: Yes Has Compression in Place as Prescribed: Yes Pain Present Now: No Electronic Signature(s) Signed: 08/16/2023 3:03:34 PM By: [name] Entered By: [name] on 08/16/2023 08:33:12 -------------------------------------------------------------------------------- Clinic Level of Care Assessment Details Patient Name: Date of Service: [name], [name] [name] 08/16/2023 11:30 A M Medical Record Number: [id number] Patient Account Number: [id number] Date of Birth/Sex: Treating RN: [birth year] (72 y.o. [name] Primary Care Tameca Jerez: [name] Other Clinician: Referring [name]: Treating [name]/Extender: [name] in Treatment: 13 Clinic Level of Care Assessment Items TOOL 4 Quantity Score X- 1 0 Use when only an EandM is performed on FOLLOW-UP visit ASSESSMENTS - Nursing Assessment / Reassessment []  - 0 Reassessment of Co-morbidities (includes updates in patient status) X- 1 5 Reassessment of Adherence to Treatment Plan ASSESSMENTS - Wound and Skin A ssessment / Reassessment X - Simple Wound Assessment / Reassessment - one wound 1 5 []  - 0 Complex Wound Assessment / Reassessment - multiple wounds []  - 0 Dermatologic / Skin Assessment (not related to wound area) ASSESSMENTS - Focused Assessment X- 1 5 Circumferential Edema Measurements - multi extremities []  - 0 Nutritional Assessment / Counseling / Intervention [name], [name] [phone] [id number] Page 2 of 9 X- 1 5 Lower Extremity Assessment (monofilament, tuning fork, pulses) []  - 0 Peripheral Arterial Disease Assessment (using hand held doppler) ASSESSMENTS - Ostomy and/or Continence Assessment and Care []  - 0 Incontinence Assessment and Management []  - 0 Ostomy Care Assessment and Management (repouching, etc.) PROCESS - Coordination of Care X - Simple Patient / Family Education for ongoing care 1 15 []  - 0 Complex (extensive) Patient / Family Education for ongoing care X- 1 10 Staff obtains Chiropractor, Records, T Results / Process Orders est []  - 0 Staff telephones HHA, Nursing Homes / Clarify orders / etc []  - 0 Routine Transfer to another Facility (non-emergent condition) []  - 0 Routine Hospital Admission (non-emergent condition) []  - 0 New Admissions / Manufacturing engineer / Ordering NPWT Apligraf, etc. , []  - 0 Emergency Hospital Admission (emergent condition) X- 1 10 Simple Discharge Coordination []  - 0 Complex (extensive) Discharge Coordination PROCESS - Special Needs []  - 0 Pediatric / Minor Patient Management []  - 0 Isolation Patient Management []  -  0 Hearing / Language / Visual special needs []  - 0 Assessment of Community assistance (transportation, D/C planning, etc.) []  - 0 Additional assistance / Altered mentation []  - 0 Support Surface(s) Assessment (bed, cushion, seat, etc.) INTERVENTIONS -  Wound Cleansing / Measurement X - Simple Wound Cleansing - one wound 1 5 []  - 0 Complex Wound Cleansing - multiple wounds X- 1 5 Wound Imaging (photographs - any number of wounds) []  - 0 Wound Tracing (instead of photographs) []  - 0 Simple Wound Measurement - one wound []  - 0 Complex Wound Measurement - multiple wounds INTERVENTIONS - Wound Dressings []  - 0 Small Wound Dressing one or multiple wounds []  - 0 Medium Wound Dressing one or multiple wounds []  - 0 Large Wound Dressing one or multiple wounds []  - 0 Application of Medications - topical []  - 0 Application of Medications - injection INTERVENTIONS - Miscellaneous []  - 0 External ear exam []  - 0 Specimen Collection (cultures, biopsies, blood, body fluids, etc.) []  - 0 Specimen(s) / Culture(s) sent or taken to Lab for analysis []  - 0 Patient Transfer (multiple staff / Nurse, adult / Similar devices) []  - 0 Simple Staple / Suture removal (25 or less) []  - 0 Complex Staple / Suture removal (26 or more) []  - 0 Hypo / Hyperglycemic Management (close monitor of Blood Glucose) [name], [name] ([id number]) [id number].pdf Page 3 of 9 []  - 0 Ankle / Brachial Index (ABI) - do not check if billed separately X- 1 5 Vital Signs Has the patient been seen at the hospital within the last three years: Yes Total Score: 70 Level Of Care: New/Established - Level 2 Electronic Signature(s) Signed: 08/16/2023 3:03:34 PM By: [name] Entered By: [name] on 08/16/2023 08:47:24 -------------------------------------------------------------------------------- Encounter Discharge Information Details Patient Name: Date of Service: [name], [name]  [name] 08/16/2023 11:30 A M Medical Record Number: [id number] Patient Account Number: [id number] Date of Birth/Sex: Treating RN: July 16, 1951 (72 y.o. [name] Primary Care [name]: [name] Other Clinician: Referring [name]: Treating Siobahn Worsley/Extender: [name] in Treatment: 13 Encounter Discharge Information Items Discharge Condition: Stable Ambulatory Status: Walker Discharge Destination: Home Transportation: Private Auto Accompanied By: self Schedule Follow-up Appointment: No Clinical Summary of Care: Patient Declined Electronic Signature(s) Signed: 08/16/2023 3:03:34 PM By: Gelene Mink By: [name] on 08/16/2023 08:47:52 -------------------------------------------------------------------------------- Lower Extremity Assessment Details Patient Name: Date of Service: [name], [name] [name] 08/16/2023 11:30 A M Medical Record Number: [id number] Patient Account Number: [id number] Date of Birth/Sex: Treating RN: 1951/04/17 (72 y.o. [name] Primary Care Alika Saladin: [name] Other Clinician: Referring Elohim Brune: Treating Calub Tarnow/Extender: [name] in Treatment: 13 Edema Assessment Assessed: [Left: No] [Right: No] Edema: [Left: N] [Right: o] Calf Left: Right: Point of Measurement: From Medial Instep 30 cm Ankle Left: Right: Point of Measurement: From Medial Instep 18.3 cm Vascular Assessment Left: [id number] Page 4 of 9Right:] Pulses: Dorsalis Pedis Palpable: [[id number] Page 4 of 9Yes] Extremity colors, hair growth, and conditions: Extremity Color: [phone].pdf Page 4 of 9Normal] Temperature of Extremity: [phone].pdf Page 4 of 9Warm] Capillary Refill: [phone].pdf Page 4 of 9< 3 seconds] Dependent Rubor:  [id number] Page 4 of 9No No] Electronic Signature(s) Signed: 08/16/2023 3:03:34 PM By: Gelene Mink By: [name] on 08/16/2023 08:37:51 -------------------------------------------------------------------------------- Multi Wound Chart Details Patient Name: Date of Service: [name], [name] [name] 08/16/2023 11:30 A M Medical Record Number: [id number] Patient Account Number: [id number] Date of Birth/Sex: Treating RN: 03/12/1951 (72 y.o. F) Primary Care Raigan Baria: [name] Other Clinician: Referring Jerzy Crotteau: Treating Josph Norfleet/Extender: [name] in Treatment: 13 Vital Signs Height(in): 64 Pulse(bpm): 91 Weight(lbs): 138 Blood Pressure(mmHg): 119/79 Body Mass Index(BMI): 23.7 Temperature(F): 97.5 Respiratory Rate(breaths/min): 18 [  19:Photos:] [N/A:N/A] Left, Posterior Lower Leg Left, Lateral Lower Leg N/A Wound Location: Blister Gradually Appeared N/A Wounding Event: Venous Leg Ulcer Venous Leg Ulcer N/A Primary Etiology: Glaucoma, Arrhythmia, Hypertension, Glaucoma, Arrhythmia, Hypertension, N/A Comorbid History: Peripheral Arterial Disease, Peripheral Peripheral Arterial Disease, Peripheral Venous Disease, Gout, Osteoarthritis Venous Disease, Gout, Osteoarthritis 02/19/2023 05/21/2023 N/A Date Acquired: 33 12 N/A Weeks of Treatment: Open Open N/A Wound Status: No No N/A Wound Recurrence: Yes No N/A Clustered Wound: 0x0x0 0x0x0 N/A Measurements L x W x D (cm) 0 0 N/A A (cm) : rea 0 0 N/A Volume (cm) : 100.00% 100.00% N/A % Reduction in Area: 100.00% 100.00% N/A % Reduction in Volume: Full Thickness Without Exposed Full Thickness Without Exposed N/A Classification: Support Structures Support Structures None Present None Present N/A Exudate Amount: Flat and Intact Flat and Intact N/A Wound Margin: None Present (0%) None Present (0%) N/A Granulation Amount: None Present (0%) None  Present (0%) N/A Necrotic Amount: Fascia: No Fascia: No N/A Exposed Structures: Fat Layer (Subcutaneous Tissue): No Fat Layer (Subcutaneous Tissue): No Tendon: No Tendon: No Muscle: No Muscle: No Joint: No Joint: No [name], [name] ([id number]) [id number] Page 5 of 9 Bone: No Bone: No Large (67-100%) Large (67-100%) N/A Epithelialization: Scarring: Yes No Abnormalities Noted N/A Periwound Skin Texture: Maceration: No No Abnormalities Noted N/A Periwound Skin Moisture: Dry/Scaly: No Hemosiderin Staining: Yes Hemosiderin Staining: Yes N/A Periwound Skin Color: No Abnormality No Abnormality N/A Temperature: Treatment Notes Electronic Signature(s) Signed: 08/16/2023 11:43:26 AM By: Duanne Guess MD FACS Entered By: Duanne Guess on 08/16/2023 08:43:26 -------------------------------------------------------------------------------- Multi-Disciplinary Care Plan Details Patient Name: Date of Service: [name], [name] [name] 08/16/2023 11:30 A M Medical Record Number: [id number] Patient Account Number: [id number] Date of Birth/Sex: Treating RN: Aug 22, 1951 (72 y.o. [name] Primary Care Sarahy Creedon: [name] Other Clinician: Referring Shanyla Marconi: Treating Arlyn Buerkle/Extender: [name] in Treatment: 13 Multidisciplinary Care Plan reviewed with physician Active Inactive Electronic Signature(s) Signed: 08/16/2023 3:03:34 PM By: Gelene Mink By: [name] on 08/16/2023 08:46:38 -------------------------------------------------------------------------------- Pain Assessment Details Patient Name: Date of Service: [name], [name] [name] 08/16/2023 11:30 A M Medical Record Number: [id number] Patient Account Number: [id number] Date of Birth/Sex: Treating RN: 1951-05-19 (72 y.o. [name] Primary Care Joann Kulpa: Neale [name] Other Clinician: Referring Denzil Bristol: Treating  Annajulia Lewing/Extender: [name] in Treatment: 13 Active Problems Location of Pain Severity and Description of Pain Patient Has Paino No Site Locations Rate the pain. [name], [name] ([id number]) [id number] Page 6 of 9 Rate the pain. Current Pain Level: 0 Pain Management and Medication Current Pain Management: Electronic Signature(s) Signed: 08/16/2023 3:03:34 PM By: [name] Entered By: [name] on 08/16/2023 08:33:23 -------------------------------------------------------------------------------- Patient/Caregiver Education Details Patient Name: Date of Service: [name], [name] 8/26/2024andnbsp11:30 A M Medical Record Number: [id number] Patient Account Number: [id number] Date of Birth/Gender: Treating RN: 1951-10-31 (72 y.o. [name] Primary Care Physician: [name] Other Clinician: Referring Physician: Treating Physician/Extender: [name] in Treatment: 13 Education Assessment Education Provided To: Patient Education Topics Provided Safety: Methods: Explain/Verbal Responses: Reinforcements needed, State content correctly Electronic Signature(s) Signed: 08/16/2023 3:03:34 PM By: [name] Entered By: [name] on 08/16/2023 08:46:54 -------------------------------------------------------------------------------- Wound Assessment Details Patient Name: Date of Service: [name], [name] [name] 08/16/2023 11:30 A M Medical Record Number: [id number] Patient Account Number: [id number] Date of Birth/Sex: Treating RN: 19-Oct-1951 (72 y.o. [name] Primary Care Malicia Blasdel: [name] Other Clinician: Referring Avalynne Diver:  Treating Amour Trigg/Extender: Jeani Hawking [place], Lura Em [name] ([id number]) [id number] Page 7 of 9 Weeks in Treatment: 13 Wound Status Wound Number: 19 Primary  Venous Leg Ulcer Etiology: Wound Location: Left, Posterior Lower Leg Wound Open Wounding Event: Blister Status: Date Acquired: 02/19/2023 Comorbid Glaucoma, Arrhythmia, Hypertension, Peripheral Arterial Disease, Weeks Of Treatment: 13 History: Peripheral Venous Disease, Gout, Osteoarthritis Clustered Wound: Yes Photos Wound Measurements Length: (cm) Width: (cm) Depth: (cm) Area: (cm) Volume: (cm) 0 % Reduction in Area: 100% 0 % Reduction in Volume: 100% 0 Epithelialization: Large (67-100%) 0 Tunneling: No 0 Undermining: No Wound Description Classification: Full Thickness Without Exposed Support Structures Wound Margin: Flat and Intact Exudate Amount: None Present Foul Odor After Cleansing: No Slough/Fibrino No Wound Bed Granulation Amount: None Present (0%) Exposed Structure Necrotic Amount: None Present (0%) Fascia Exposed: No Fat Layer (Subcutaneous Tissue) Exposed: No Tendon Exposed: No Muscle Exposed: No Joint Exposed: No Bone Exposed: No Periwound Skin Texture Texture Color No Abnormalities Noted: Yes No Abnormalities Noted: Yes Moisture Temperature / Pain No Abnormalities Noted: No Temperature: No Abnormality Dry / Scaly: No Maceration: No Electronic Signature(s) Signed: 08/16/2023 3:03:34 PM By: [name] Entered By: [name] on 08/16/2023 08:40:29 -------------------------------------------------------------------------------- Wound Assessment Details Patient Name: Date of Service: [name], [name] [name] 08/16/2023 11:30 A M Medical Record Number: [id number] Patient Account Number: [id number] Date of Birth/Sex: Treating RN: [birth year] (72 y.o. [name] Primary Care Amarys Sliwinski: [name] Other Clinician: Referring Erol Flanagin: Treating Ahan Eisenberger/Extender: [name] in Treatment: [address], [name] ([id number]) [id number] Page 8 of 9 Wound Status Wound Number: 21  Primary Venous Leg Ulcer Etiology: Wound Location: Left, Lateral Lower Leg Wound Open Wounding Event: Gradually Appeared Status: Date Acquired: 05/21/2023 Comorbid Glaucoma, Arrhythmia, Hypertension, Peripheral Arterial Disease, Weeks Of Treatment: 12 History: Peripheral Venous Disease, Gout, Osteoarthritis Clustered Wound: No Photos Wound Measurements Length: (cm) Width: (cm) Depth: (cm) Area: (cm) Volume: (cm) 0 % Reduction in Area: 100% 0 % Reduction in Volume: 100% 0 Epithelialization: Large (67-100%) 0 Tunneling: No 0 Undermining: No Wound Description Classification: Full Thickness Without Exposed Support Structures Wound Margin: Flat and Intact Exudate Amount: None Present Foul Odor After Cleansing: No Slough/Fibrino No Wound Bed Granulation Amount: None Present (0%) Exposed Structure Necrotic Amount: None Present (0%) Fascia Exposed: No Fat Layer (Subcutaneous Tissue) Exposed: No Tendon Exposed: No Muscle Exposed: No Joint Exposed: No Bone Exposed: No Periwound Skin Texture Texture Color No Abnormalities Noted: Yes No Abnormalities Noted: Yes Moisture Temperature / Pain No Abnormalities Noted: Yes Temperature: No Abnormality Electronic Signature(s) Signed: 08/16/2023 3:03:34 PM By: [name] Entered By: [name] on 08/16/2023 08:40:59 -------------------------------------------------------------------------------- Vitals Details Patient Name: Date of Service: Stacey [name], PA TSY [name] 08/16/2023 11:30 A M Medical Record Number: [id number] Patient Account Number: [id number] Date of Birth/Sex: Treating RN: 1951-09-18 (72 y.o. [name] Primary Care Nur Krasinski: [name] Other Clinician: Referring Laria Grimmett: Treating Donasia Wimes/Extender: [name] in Treatment: [address] [name], [name] ([id number]) [id number] Page 9 of 9 Time Taken: 11:35 Temperature (F): 97.5 Height  (in): 64 Pulse (bpm): 91 Weight (lbs): 138 Respiratory Rate (breaths/min): 18 Body Mass Index (BMI): 23.7 Blood Pressure (mmHg): 119/79 Reference Range: 80 - 120 mg / dl Electronic Signature(s) Signed: 08/16/2023 3:03:34 PM By: [name] Entered By: [name] on 08/16/2023 08:35:29

## 2023-08-17 NOTE — Progress Notes (Signed)
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 OR < ICD-10 Diagnosis Description L97.822 Non-pressure chronic ulcer of other part of left lower leg with fat layer expose Modifier: d Quantity: 1 Physician Procedures : CPT4 Code Description Modifier [id number] - WC PHYS LEVEL 4 - EST PT 25 ICD-10 Diagnosis Description L97.822 Non-pressure chronic ulcer of other part of left lower leg with fat layer exposed I73.9 Peripheral vascular disease, unspecified Quantity: 1 : [id number] - WC PHYS DEBR WO ANESTH 20 SQ CM ICD-10 Diagnosis Description L97.822 Non-pressure chronic ulcer of other part of left lower leg with fat layer exposed Quantity: 1 Electronic Signature(s)  Signed: 06/25/2023 11:37:50 AM By: Duanne Guess MD FACS Entered By: Duanne Guess on 06/25/2023 11:37:50

## 2023-08-18 ENCOUNTER — Inpatient Hospital Stay: Payer: 59 | Attending: Hematology | Admitting: Hematology

## 2023-08-18 ENCOUNTER — Inpatient Hospital Stay: Payer: 59

## 2023-08-18 ENCOUNTER — Encounter: Payer: Self-pay | Admitting: Hematology

## 2023-08-18 VITALS — BP 95/59 | HR 68 | Temp 98.0°F | Resp 18 | Ht 64.5 in | Wt 124.9 lb

## 2023-08-18 DIAGNOSIS — D509 Iron deficiency anemia, unspecified: Secondary | ICD-10-CM | POA: Diagnosis present

## 2023-08-18 DIAGNOSIS — D45 Polycythemia vera: Secondary | ICD-10-CM | POA: Diagnosis present

## 2023-08-18 DIAGNOSIS — D500 Iron deficiency anemia secondary to blood loss (chronic): Secondary | ICD-10-CM | POA: Diagnosis not present

## 2023-08-18 DIAGNOSIS — D72829 Elevated white blood cell count, unspecified: Secondary | ICD-10-CM

## 2023-08-18 LAB — CBC WITH DIFFERENTIAL (CANCER CENTER ONLY)
Abs Immature Granulocytes: 1.26 10*3/uL — ABNORMAL HIGH (ref 0.00–0.07)
Basophils Absolute: 0.4 10*3/uL — ABNORMAL HIGH (ref 0.0–0.1)
Basophils Relative: 1 %
Eosinophils Absolute: 0.7 10*3/uL — ABNORMAL HIGH (ref 0.0–0.5)
Eosinophils Relative: 2 %
HCT: 41.2 % (ref 36.0–46.0)
Hemoglobin: 12.4 g/dL (ref 12.0–15.0)
Immature Granulocytes: 4 %
Lymphocytes Relative: 4 %
Lymphs Abs: 1.3 10*3/uL (ref 0.7–4.0)
MCH: 22.8 pg — ABNORMAL LOW (ref 26.0–34.0)
MCHC: 30.1 g/dL (ref 30.0–36.0)
MCV: 75.7 fL — ABNORMAL LOW (ref 80.0–100.0)
Monocytes Absolute: 0.8 10*3/uL (ref 0.1–1.0)
Monocytes Relative: 3 %
Neutro Abs: 24.4 10*3/uL — ABNORMAL HIGH (ref 1.7–7.7)
Neutrophils Relative %: 86 %
Platelet Count: 386 10*3/uL (ref 150–400)
RBC: 5.44 MIL/uL — ABNORMAL HIGH (ref 3.87–5.11)
RDW: 19.4 % — ABNORMAL HIGH (ref 11.5–15.5)
WBC Count: 28.8 10*3/uL — ABNORMAL HIGH (ref 4.0–10.5)
nRBC: 0.5 % — ABNORMAL HIGH (ref 0.0–0.2)

## 2023-08-18 NOTE — Assessment & Plan Note (Signed)
Secondary to previous phlebotomy.  -Her HG was trending down since 06/27/20 at 10. -she has intermittently been on oral iron, last 01/2021. -Due to her recent anemia and iron deficiency, I recommend her to restart oral iron
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
## 2023-08-24 ENCOUNTER — Ambulatory Visit: Payer: 59 | Admitting: Family Medicine

## 2023-08-24 LAB — BCR ABL1 FISH (GENPATH)

## 2023-08-31 ENCOUNTER — Other Ambulatory Visit: Payer: Self-pay

## 2023-08-31 ENCOUNTER — Emergency Department (HOSPITAL_COMMUNITY)
Admission: EM | Admit: 2023-08-31 | Discharge: 2023-09-01 | Disposition: A | Discharge status: Home / Self Care | Payer: 59 | Attending: Emergency Medicine | Admitting: Emergency Medicine

## 2023-08-31 DIAGNOSIS — I2510 Atherosclerotic heart disease of native coronary artery without angina pectoris: Secondary | ICD-10-CM | POA: Diagnosis not present

## 2023-08-31 DIAGNOSIS — Z8249 Family history of ischemic heart disease and other diseases of the circulatory system: Secondary | ICD-10-CM | POA: Diagnosis not present

## 2023-08-31 DIAGNOSIS — R2242 Localized swelling, mass and lump, left lower limb: Secondary | ICD-10-CM | POA: Diagnosis present

## 2023-08-31 DIAGNOSIS — Z87891 Personal history of nicotine dependence: Secondary | ICD-10-CM | POA: Diagnosis not present

## 2023-08-31 DIAGNOSIS — I739 Peripheral vascular disease, unspecified: Secondary | ICD-10-CM | POA: Diagnosis not present

## 2023-08-31 DIAGNOSIS — R8290 Unspecified abnormal findings in urine: Secondary | ICD-10-CM | POA: Insufficient documentation

## 2023-08-31 DIAGNOSIS — I10 Essential (primary) hypertension: Secondary | ICD-10-CM | POA: Insufficient documentation

## 2023-08-31 DIAGNOSIS — M79605 Pain in left leg: Secondary | ICD-10-CM

## 2023-08-31 DIAGNOSIS — M79662 Pain in left lower leg: Secondary | ICD-10-CM | POA: Diagnosis not present

## 2023-08-31 LAB — URINALYSIS, W/ REFLEX TO CULTURE (INFECTION SUSPECTED)
Bacteria, UA: NONE SEEN
Bilirubin Urine: NEGATIVE
Glucose, UA: 50 mg/dL — AB
Hgb urine dipstick: NEGATIVE
Ketones, ur: NEGATIVE mg/dL
Nitrite: NEGATIVE
Protein, ur: >=300 mg/dL — AB
Specific Gravity, Urine: 1.017 (ref 1.005–1.030)
WBC, UA: >50 WBC/hpf (ref 0–5)
pH: 6 (ref 5.0–8.0)

## 2023-08-31 LAB — CBC WITH DIFFERENTIAL/PLATELET
Abs Immature Granulocytes: 0.8 10*3/uL — ABNORMAL HIGH (ref 0.00–0.07)
Band Neutrophils: 1 %
Basophils Absolute: 0 10*3/uL (ref 0.0–0.1)
Basophils Relative: 0 %
Eosinophils Absolute: 0.8 10*3/uL — ABNORMAL HIGH (ref 0.0–0.5)
Eosinophils Relative: 3 %
HCT: 41.5 % (ref 36.0–46.0)
Hemoglobin: 11.8 g/dL — ABNORMAL LOW (ref 12.0–15.0)
Lymphocytes Relative: 3 %
Lymphs Abs: 0.8 10*3/uL (ref 0.7–4.0)
MCH: 21.9 pg — ABNORMAL LOW (ref 26.0–34.0)
MCHC: 28.4 g/dL — ABNORMAL LOW (ref 30.0–36.0)
MCV: 77 fL — ABNORMAL LOW (ref 80.0–100.0)
Metamyelocytes Relative: 1 %
Monocytes Absolute: 0.3 10*3/uL (ref 0.1–1.0)
Monocytes Relative: 1 %
Myelocytes: 2 %
Neutro Abs: 22.6 10*3/uL — ABNORMAL HIGH (ref 1.7–7.7)
Neutrophils Relative %: 89 %
Platelets: 363 10*3/uL (ref 150–400)
RBC: 5.39 MIL/uL — ABNORMAL HIGH (ref 3.87–5.11)
RDW: 20.4 % — ABNORMAL HIGH (ref 11.5–15.5)
WBC: 25.1 10*3/uL — ABNORMAL HIGH (ref 4.0–10.5)
nRBC: 0.5 % — ABNORMAL HIGH (ref 0.0–0.2)
nRBC: 1 /100{WBCs} — ABNORMAL HIGH

## 2023-08-31 LAB — COMPREHENSIVE METABOLIC PANEL
ALT: 20 U/L (ref 0–44)
AST: 28 U/L (ref 15–41)
Albumin: 2.9 g/dL — ABNORMAL LOW (ref 3.5–5.0)
Alkaline Phosphatase: 79 U/L (ref 38–126)
Anion gap: 10 (ref 5–15)
BUN: 36 mg/dL — ABNORMAL HIGH (ref 8–23)
CO2: 17 mmol/L — ABNORMAL LOW (ref 22–32)
Calcium: 7.6 mg/dL — ABNORMAL LOW (ref 8.9–10.3)
Chloride: 109 mmol/L (ref 98–111)
Creatinine, Ser: 1.57 mg/dL — ABNORMAL HIGH (ref 0.44–1.00)
GFR, Estimated: 35 mL/min — ABNORMAL LOW (ref >60)
Glucose, Bld: 142 mg/dL — ABNORMAL HIGH (ref 70–99)
Potassium: 4.5 mmol/L (ref 3.5–5.1)
Sodium: 136 mmol/L (ref 135–145)
Total Bilirubin: 0.5 mg/dL (ref 0.3–1.2)
Total Protein: 5.8 g/dL — ABNORMAL LOW (ref 6.5–8.1)

## 2023-08-31 LAB — LACTIC ACID, PLASMA: Lactic Acid, Venous: 1.4 mmol/L (ref 0.5–1.9)

## 2023-08-31 MED ORDER — CLINDAMYCIN HCL 300 MG PO CAPS: 300.0000 mg | ORAL_CAPSULE | Freq: Three times a day (TID) | ORAL | 0 refills | Status: AC

## 2023-08-31 MED ORDER — CLINDAMYCIN HCL 150 MG PO CAPS
300.0000 mg | ORAL_CAPSULE | Freq: Once | ORAL | Status: AC
  Administered 2023-08-31: 300 mg via ORAL
  Filled 2023-08-31: qty 2

## 2023-08-31 NOTE — ED Notes (Signed)
Pt ambulated to the bathroom with the help of walker. Pt is now back in bed with call light in reach.

## 2023-08-31 NOTE — ED Triage Notes (Signed)
Pt complains of pain, swelling, redness and some drainage from leg lower leg x 8 days. Pt has history of blood clots and concern this is one. Pt denies any fevers.

## 2023-08-31 NOTE — ED Provider Notes (Signed)
[facility]-EMERGENCY DEPT [facility] Emergency Department Provider Note MRN:  [id number]  Arrival date & time: 08/31/23     Chief Complaint   Leg Swelling   History of Present Illness   [name] is a 72 y.o. year-old female with a history of CAD, PAD presenting to the ED with chief complaint of leg swelling.  Leg pain and swelling over the past few days.  A bit more red than normal.  No fever.  Worried about a blood clot.  Review of Systems  A thorough review of systems was obtained and all systems are negative except as noted in the HPI and PMH.   Patient's Health History    Past Medical History:  Diagnosis Date   Acute blood loss anemia 05/04/2020   Arthritis    "fingers, some in my knees" (01/28/2016)   CAD (coronary artery disease), per cath 04/27/13, non obstructive disease, EF 65-70% 05/11/2013   Carotid disease, bilateral (HCC) 09/20/2013   Lt carotid 50-69% stentosis, less on rt   Cellulitis of left leg 01/16/2022   Dysrhythmia    Elevated troponin 05/04/2020   Encounter for support and coordination of transition of care 11/11/2021   Fatty liver    GERD (gastroesophageal reflux disease)    Glaucoma    H/O cardiovascular stress test 12/27/2009   negative for ischemia   Headache    "occasional since eye pressure regulated" (01/28/2016)   Hospital discharge follow-up 10/15/2021   Hyperlipidemia    Hypertension    Hypertensive emergency 11/28/2022   Leukocytosis 07/15/2015   Migraine    "stopped w/laser holes to relieve pressure in my eyes; had them 3-4 times/wk before taht" (01/28/2016)   Mitral regurgitation,2+ by cath 05/11/2013   NSTEMI (non-ST elevated myocardial infarction) (HCC) 04/27/2013   Pain    BOTH KNEES - PT HAS TORN MENISCUS LEFT KNEE   Pain in both wrists 09/03/2021   Pain in left knee 10/03/2018   Paroxysmal atrial fibrillation (HCC)    hx of a fib on ASA  AND ELIQUIS    Polycythemia vera (HCC) 08/20/2015   JAK-2 positive 07/19/15   Prosthesis  adjustment 01/13/2019   PVD (peripheral vascular disease) with claudication (HCC) 07/21/2006   previous L ext iliac stent and rt SFA stent, known occluded Lt SFA   S/P cardiac cath 04/27/2013   NON OBSTRUCTIVE DISEASE, 2+ mr, ef 65-70%   Statin intolerance    Stroke (HCC) 12/21/2004   SWELLING ALL OVER AND RT SIDE OF FACE DRAWN AND SPEECH SLURRED AND NUMBNESS ON RIGHT SIDE-- ALL RESOLVED   TIA (transient ischemic attack)     Past Surgical History:  Procedure Laterality Date   AMPUTATION Right 05/21/2017   Procedure: AMPUTATION ABOVE KNEE;  Surgeon: Larina Earthly, MD;  [facility]: [facility] OR;  Service: Vascular;  Laterality: Right;   CARDIAC CATHETERIZATION  04/27/13   non occlusive disease with mild to mod. calcified lesions in ostial LM and proximal LAD and moderate prox. RC AND DISTAL AV GROOVE LCX, ef   CATARACT EXTRACTION W/PHACO Right 03/21/2015   Procedure: CATARACT EXTRACTION PHACO AND INTRAOCULAR LENS PLACEMENT RIGHT EYE;  Surgeon: Fabio Pierce, MD;  Location: [facility] ORS;  Service: Ophthalmology;  Laterality: Right;  CDE:5.10   CATARACT EXTRACTION W/PHACO Left 05/16/2015   Procedure: CATARACT EXTRACTION PHACO AND INTRAOCULAR LENS PLACEMENT (IOC);  Surgeon: Fabio Pierce, MD;  Location: [facility] ORS;  Service: Ophthalmology;  Laterality: Left;  CDE 5.57   CESAREAN SECTION  1977   COLONOSCOPY N/A 09/11/2016  Procedure: COLONOSCOPY;  Surgeon: Malissa Hippo, MD;  Location: [facility] ENDO SUITE;  Service: Endoscopy;  Laterality: N/A;  730-moved to 1:00 Ann notified pt   EMBOLECTOMY Right 02/01/2016   Procedure: Thrombectomy of Right Femoral-Popliteal Bypass Graft; Endarterectomy of Right Below Knee Popliteal Artery and Tibial Peroneal Trunk with Bovine Pericardium Patch Angioplasty.;  Surgeon: Chuck Hint, MD;  Location: [facility] OR;  Service: Vascular;  Laterality: Right;   FEMORAL-POPLITEAL BYPASS GRAFT Right 01/31/2016   Procedure: BYPASS GRAFT RIGHT COMMON  FEMORALTO BELOW KNEE POPLITEAL ARTERY BYPASS GRAFT  USING PROPATEN GORTEX GRAFT;  Surgeon: Pryor Ochoa, MD;  Location: [facility] OR;  Service: Vascular;  Laterality: Right;   FEMORAL-POPLITEAL BYPASS GRAFT Right 01/09/2017   Procedure: THROMBECTOMY OF RIGHT FEMORAL-POPLITEAL  ARTERY BYPASS GRAFT; THROMBECTOMY RIGHT  TIBIAL VESSELS;  Surgeon: Sherren Kerns, MD;  Location: [facility] OR;  Service: Vascular;  Laterality: Right;   FEMORAL-POPLITEAL BYPASS GRAFT Right 05/17/2017   Procedure: RIGHT FEMORAL-TIBIAL PERONEAL TRUNK ARTERY BYPASS GRAFT USING 78mmX80cm PROPATEN GRAFT WITH REMOVABLE RING;  Surgeon: Larina Earthly, MD;  Location: [facility] OR;  Service: Vascular;  Laterality: Right;   FRACTURE SURGERY     ILIAC ARTERY STENT  07/2006   external iliac and Rt SFA stent 07/2006 AND THE LEFT WAS IN 2006   KNEE ARTHROSCOPY WITH MEDIAL MENISECTOMY Left 03/27/2014   Procedure: left knee arthorscopy with medial chondraplasty of the medial femoral and patella, medial microfracture technique of medial femoral condyl;  Surgeon: Jacki Cones, MD;  Location: [facility] ORS;  Service: Orthopedics;  Laterality: Left;   LEFT HEART CATHETERIZATION WITH CORONARY ANGIOGRAM Right 04/27/2013   Procedure: LEFT HEART CATHETERIZATION WITH CORONARY ANGIOGRAM;  Surgeon: Marykay Lex, MD;  Location: [facility] CATH LAB;  Service: Cardiovascular;  Laterality: Right;   LOWER EXTREMITY ANGIOGRAM Right 01/31/2016   Procedure: INTRAOP RIGHT LOWER EXTREMITY ANGIOGRAM;  Surgeon: Pryor Ochoa, MD;  Location: [facility] [place] OR;  Service: Vascular;  Laterality: Right;   ORIF WRIST FRACTURE Right 1983   OVARIAN CYST REMOVAL Right    PATCH ANGIOPLASTY Right 01/31/2016   Procedure: RIGHT COMMON FEMORAL AND PROFUNDA FEMORIS ENDARECTOMY WITH PATCH ANGIOPLASTY;  Surgeon: Pryor Ochoa, MD;  Location: [facility] OR;  Service: Vascular;  Laterality: Right;   PATCH ANGIOPLASTY Right 01/09/2017   Procedure: PATCH ANGIOPLASTY RIGHT POPLITEAL ARTERY BYPASS GRAFT;  Surgeon: Sherren Kerns, MD;  Location: [facility] OR;  Service: Vascular;  Laterality:  Right;   PERIPHERAL VASCULAR CATHETERIZATION N/A 06/27/2015   Procedure: Lower Extremity Angiography;  Surgeon: Runell Gess, MD;  Location: [facility] INVASIVE CV LAB;  Service: Cardiovascular;  Laterality: N/A;   PERIPHERAL VASCULAR CATHETERIZATION Bilateral 01/29/2016   Procedure: Lower Extremity Angiography;  Surgeon: Iran Ouch, MD;  Location: [facility] INVASIVE CV LAB;  Service: Cardiovascular;  Laterality: Bilateral;   PERIPHERAL VASCULAR CATHETERIZATION N/A 01/29/2016   Procedure: Abdominal Aortogram;  Surgeon: Iran Ouch, MD;  Location: [facility] INVASIVE CV LAB;  Service: Cardiovascular;  Laterality: N/A;   REFRACTIVE SURGERY Bilateral    "6 in one eye; 7 in the other; to relieve pressure; not glaucoma" (01/28/2016)   SFA Right 06/27/2015   overlapping Bahn covered stents   TUBAL LIGATION  1983   VEIN HARVEST Left 05/17/2017   Procedure: LEFT LEG GREATER SAPHENOUS VEIN HARVEST;  Surgeon: Larina Earthly, MD;  Location: [facility] OR;  Service: Vascular;  Laterality: Left;   VEIN REPAIR Right 01/31/2016   Procedure: RIGHT GREATER SAPHENOUS VEIN EXAMNED BUT NOT REMOVED;  Surgeon:  Pryor Ochoa, MD;  Location: [facility] OR;  Service: Vascular;  Laterality: Right;    Family History  Problem Relation Age of Onset   CAD Mother    Lung cancer Mother    Heart attack Mother    CAD Father    Heart disease Father    Heart attack Father 69       died in hes 78's with heart disease   Healthy Sister    Liver cancer Brother    Healthy Sister    CAD Brother        has had CABG   CAD Brother     Social History   Socioeconomic History   Marital status: Widowed    Spouse name: Not on file   Number of children: 2   Years of education: 60   Highest education level: GED or equivalent  Occupational History   Occupation: CNA  Tobacco Use   Smoking status: Former    Current packs/day: 0.00    Average packs/day: 1 pack/day for 45.0 years (45.0 ttl pk-yrs)    Types: Cigarettes    Start date: 11/12/1967    Quit date:  11/11/2012    Years since quitting: 10.8   Smokeless tobacco: Never   Tobacco comments:    quit 4 years ago.    10/27/21-Pt declines lung ca screen at this time.   Vaping Use   Vaping status: Never Used  Substance and Sexual Activity   Alcohol use: No    Alcohol/week: 0.0 standard drinks of alcohol   Drug use: No   Sexual activity: Not Currently    Birth control/protection: None  Other Topics Concern   Not on file  Social History Narrative   Husband passed away after CVA in September 15, 2005.   2 children, 5 grandchildren and 1 great grandchild.   Social Determinants of Health   Financial Resource Strain: Low Risk  (11/27/2022)   Overall Financial Resource Strain (CARDIA)    Difficulty of Paying Living Expenses: Not hard at all  Food Insecurity: No Food Insecurity (06/22/2023)   Hunger Vital Sign    Worried About Running Out of Food in the Last Year: Never true    Ran Out of Food in the Last Year: Never true  Transportation Needs: No Transportation Needs (06/22/2023)   PRAPARE - Administrator, Civil Service (Medical): No    Lack of Transportation (Non-Medical): No  Physical Activity: Inactive (06/22/2023)   Exercise Vital Sign    Days of Exercise per Week: 0 days    Minutes of Exercise per Session: 30 min  Stress: No Stress Concern Present (06/22/2023)   [employer] of Occupational Health - Occupational Stress Questionnaire    Feeling of Stress : Not at all  Social Connections: Moderately Isolated (06/22/2023)   Social Connection and Isolation Panel [NHANES]    Frequency of Communication with Friends and Family: More than three times a week    Frequency of Social Gatherings with Friends and Family: More than three times a week    Attends Religious Services: 1 to 4 times per year    Active Member of [employer] or Organizations: No    Attends Banker Meetings: Never    Marital Status: Widowed  Intimate Partner Violence: Not At Risk (11/28/2022)   Humiliation, Afraid,  Rape, and Kick questionnaire    Fear of Current or Ex-Partner: No    Emotionally Abused: No    Physically Abused: No    Sexually Abused:  No     Physical Exam   Vitals:   08/31/23 2215 08/31/23 2330  BP: (!) 133/119 (!) 188/72  Pulse: 60 79  Resp: 18 19  Temp:    SpO2: 95% 93%    CONSTITUTIONAL: Chronically ill-appearing, NAD NEURO/PSYCH:  Alert and oriented x 3, no focal deficits EYES:  eyes equal and reactive ENT/NECK:  no LAD, no JVD CARDIO: Regular rate, well-perfused, normal S1 and S2 PULM:  CTAB no wheezing or rhonchi GI/GU:  non-distended, non-tender MSK/SPINE: Right leg amputation, left leg has redness and edema SKIN:  no rash, atraumatic   *Additional and/or pertinent findings included in MDM below  Diagnostic and Interventional Summary    EKG Interpretation Date/Time:    Ventricular Rate:    PR Interval:    QRS Duration:    QT Interval:    QTC Calculation:   R Axis:      Text Interpretation:         Labs Reviewed  COMPREHENSIVE METABOLIC PANEL - Abnormal; Notable for the following components:      Result Value   CO2 17 (*)    Glucose, Bld 142 (*)    BUN 36 (*)    Creatinine, Ser 1.57 (*)    Calcium 7.6 (*)    Total Protein 5.8 (*)    Albumin 2.9 (*)    GFR, Estimated 35 (*)    All other components within normal limits  CBC WITH DIFFERENTIAL/PLATELET - Abnormal; Notable for the following components:   WBC 25.1 (*)    RBC 5.39 (*)    Hemoglobin 11.8 (*)    MCV 77.0 (*)    MCH 21.9 (*)    MCHC 28.4 (*)    RDW 20.4 (*)    nRBC 0.5 (*)    Neutro Abs 22.6 (*)    Eosinophils Absolute 0.8 (*)    nRBC 1 (*)    Abs Immature Granulocytes 0.80 (*)    All other components within normal limits  URINALYSIS, W/ REFLEX TO CULTURE (INFECTION SUSPECTED) - Abnormal; Notable for the following components:   Glucose, UA 50 (*)    Protein, ur >=300 (*)    Leukocytes,Ua SMALL (*)    All other components within normal limits  URINE CULTURE  LACTIC ACID,  PLASMA    US Venous Img Lower Unilateral Left    (Results Pending)    Medications  clindamycin (CLEOCIN) capsule 300 mg (has no administration in time range)     Procedures  /  Critical Care Procedures  ED Course and Medical Decision Making  Initial Impression and Ddx Overall suspicious for cellulitis.  Doubt deeper space infection.  No signs of abscess.  DVT also possible.  Highly doubt arterial insufficiency or clot.  Has good cap refill to all toes, palpable DP pulse.  Past medical/surgical history that increases complexity of ED encounter: PAD  Interpretation of Diagnostics I personally reviewed the laboratory assessment and my interpretation is as follows: No significant blood count or electrolyte disturbance when compared to prior values    Patient Reassessment and Ultimate Disposition/Management     Discharge  Patient management required discussion with the following services or consulting groups:  None  Complexity of Problems Addressed Acute illness or injury that poses threat of life of bodily function  Additional Data Reviewed and Analyzed Further history obtained from: Recent Consult notes and Prior labs/imaging results  Additional Factors Impacting ED Encounter Risk Prescriptions  Elmer Sow. Pilar Plate, MD [facility] Emergency Medicine [facility]  [facility] [email or web address]  Final Clinical Impressions(s) / ED Diagnoses     ICD-10-CM   1. Pain of left lower extremity  M79.605       ED Discharge Orders          Ordered    clindamycin (CLEOCIN) 300 MG capsule  3 times daily        08/31/23 2345    US Venous Img Lower Unilateral Left        08/31/23 2345             Discharge Instructions Discussed with and Provided to Patient:    Discharge Instructions      You were evaluated in the Emergency Department and after careful evaluation, we did not find any emergent condition requiring admission or further testing in the hospital.  Your  exam/testing today was overall reassuring.  We recommend that you return for ultrasound to exclude blood clot.  Take the clindamycin antibiotic to treat for any infection.  Please return to the Emergency Department if you experience any worsening of your condition.  Thank you for allowing [place] to be a part of your care.       Sabas Sous, MD 08/31/23 [phone]

## 2023-08-31 NOTE — Discharge Instructions (Signed)
You were evaluated in the Emergency Department and after careful evaluation, we did not find any emergent condition requiring admission or further testing in the hospital.  Your exam/testing today was overall reassuring.  We recommend that you return for ultrasound to exclude blood clot.  Take the clindamycin antibiotic to treat for any infection.  Please return to the Emergency Department if you experience any worsening of your condition.  Thank you for allowing [place] to be a part of your care.

## 2023-09-01 ENCOUNTER — Ambulatory Visit (HOSPITAL_COMMUNITY)
Admission: RE | Admit: 2023-09-01 | Discharge: 2023-09-01 | Disposition: A | Discharge status: Home / Self Care | Payer: 59 | Source: Ambulatory Visit | Attending: Emergency Medicine | Admitting: Emergency Medicine

## 2023-09-01 DIAGNOSIS — M79605 Pain in left leg: Secondary | ICD-10-CM | POA: Insufficient documentation

## 2023-09-01 DIAGNOSIS — M79662 Pain in left lower leg: Secondary | ICD-10-CM | POA: Diagnosis not present

## 2023-09-01 NOTE — ED Provider Notes (Signed)
Patient here for ultrasound of her right lower extremity to rule out blood clot after being seen and treated for likely cellulitis last night.  Ultrasound is negative per radiology report. Pt informed and understands the results.    Ma Rings, PA-C 09/01/23 1725    Sloan Leiter, DO 09/05/23 2352

## 2023-09-02 LAB — URINE CULTURE: Culture: NO GROWTH

## 2023-09-07 ENCOUNTER — Other Ambulatory Visit: Payer: Self-pay | Admitting: Family Medicine

## 2023-09-07 DIAGNOSIS — I214 Non-ST elevation (NSTEMI) myocardial infarction: Secondary | ICD-10-CM

## 2023-09-07 DIAGNOSIS — I10 Essential (primary) hypertension: Secondary | ICD-10-CM

## 2023-09-07 DIAGNOSIS — I482 Chronic atrial fibrillation, unspecified: Secondary | ICD-10-CM

## 2023-09-07 DIAGNOSIS — E782 Mixed hyperlipidemia: Secondary | ICD-10-CM

## 2023-09-07 DIAGNOSIS — Z8673 Personal history of transient ischemic attack (TIA), and cerebral infarction without residual deficits: Secondary | ICD-10-CM

## 2023-09-07 DIAGNOSIS — M1712 Unilateral primary osteoarthritis, left knee: Secondary | ICD-10-CM

## 2023-09-07 NOTE — Telephone Encounter (Signed)
[name] pt NTBS 30-d given 08/09/23

## 2023-09-07 NOTE — Telephone Encounter (Signed)
Left message for pt to call back to schedule appt

## 2023-09-14 ENCOUNTER — Ambulatory Visit (INDEPENDENT_AMBULATORY_CARE_PROVIDER_SITE_OTHER): Payer: 59 | Admitting: Family Medicine

## 2023-09-14 ENCOUNTER — Encounter: Payer: Self-pay | Admitting: Family Medicine

## 2023-09-14 VITALS — BP 129/70 | HR 73 | Temp 98.0°F | Ht 64.0 in | Wt 122.0 lb

## 2023-09-14 DIAGNOSIS — E785 Hyperlipidemia, unspecified: Secondary | ICD-10-CM

## 2023-09-14 DIAGNOSIS — Z8673 Personal history of transient ischemic attack (TIA), and cerebral infarction without residual deficits: Secondary | ICD-10-CM

## 2023-09-14 DIAGNOSIS — Z89611 Acquired absence of right leg above knee: Secondary | ICD-10-CM

## 2023-09-14 DIAGNOSIS — I482 Chronic atrial fibrillation, unspecified: Secondary | ICD-10-CM

## 2023-09-14 DIAGNOSIS — N1832 Chronic kidney disease, stage 3b: Secondary | ICD-10-CM | POA: Diagnosis not present

## 2023-09-14 DIAGNOSIS — I10 Essential (primary) hypertension: Secondary | ICD-10-CM

## 2023-09-14 DIAGNOSIS — D45 Polycythemia vera: Secondary | ICD-10-CM

## 2023-09-14 DIAGNOSIS — I739 Peripheral vascular disease, unspecified: Secondary | ICD-10-CM

## 2023-09-14 DIAGNOSIS — L03116 Cellulitis of left lower limb: Secondary | ICD-10-CM

## 2023-09-14 MED ORDER — FENOFIBRATE 145 MG PO TABS
145.0000 mg | ORAL_TABLET | Freq: Every day | ORAL | 0 refills | Status: DC
Start: 2023-09-14 — End: 2023-10-19

## 2023-09-14 MED ORDER — APIXABAN 5 MG PO TABS
5.0000 mg | ORAL_TABLET | Freq: Two times a day (BID) | ORAL | 0 refills | Status: DC
Start: 2023-09-14 — End: 2023-10-19

## 2023-09-14 MED ORDER — EZETIMIBE 10 MG PO TABS: 10.0000 mg | ORAL_TABLET | Freq: Every day | ORAL | 0 refills | Status: DC

## 2023-09-14 MED ORDER — FLECAINIDE ACETATE 50 MG PO TABS
50.0000 mg | ORAL_TABLET | Freq: Two times a day (BID) | ORAL | 0 refills | Status: DC
Start: 2023-09-14 — End: 2023-10-19

## 2023-09-14 MED ORDER — DILTIAZEM HCL ER COATED BEADS 240 MG PO CP24
240.0000 mg | ORAL_CAPSULE | Freq: Every day | ORAL | 0 refills | Status: DC
Start: 2023-09-14 — End: 2023-10-19

## 2023-09-14 MED ORDER — DOXYCYCLINE HYCLATE 100 MG PO TABS
100.0000 mg | ORAL_TABLET | Freq: Two times a day (BID) | ORAL | 0 refills | Status: AC
Start: 2023-09-14 — End: 2023-09-21

## 2023-09-14 NOTE — Progress Notes (Signed)
Subjective:  Patient ID: [name], female    DOB: 1951/04/27, 72 y.o.   MRN: [id number]  Patient Care Team: Arrie Senate, FNP as PCP - General (Family Medicine) Runell Gess, MD as PCP - Cardiology (Cardiology) Levert Feinstein, MD as Consulting Physician (Oncology) Sherren Kerns, MD (Inactive) as Consulting Physician (Vascular Surgery) Malachy Mood, MD as Attending Physician (Hematology and Oncology)   Chief Complaint:  Medical Management of Chronic Issues  HPI: [name] is a 72 y.o. female presenting on 09/14/2023 for Medical Management of Chronic Issues 1. Chronic atrial fibrillation [facility]) Patient is established with cardiology. However, states that she requires refill on medications and that she has "always" gotten them here. Patient followed up with Cardiology in March of 2024.  Denies any chest pain, fatigue. States that her shortness of breath has improved. She believes it may have been due to the summer heat.   2. Stage 3b chronic kidney disease (HCC) Patient unsure of diagnosis.  She is on a high dose of ARB due to BP, which is managed by Cardiology  Reports compliance with medications.   3. Amputee, above knee, right (HCC) Continues to follow with pain clinic. Has follow up on October 7th.  Doing well on prosthetic Denies any recent falls    4. Polycythemia vera (HCC) Reports that she is established with oncology and has follow up with them   5. PAD (peripheral artery disease), Lt carotid 50-70%: , Hx stent Lt exteral iliac & RSFA stent, known occl. LSFA  Reports compliance with medications. Would like refills on tricor and zetia.   6. Cellulitis of left lower extremity Reports that she went to the ED on 09/01/23. She was negative for DVT and started on clindamycin for cellulitis. Denies systemic symptoms. States that she does not feel that it is fully resolved. She continues to have some pain at the site. She is continuing to have some  edema and redness at the site.   7. Primary hypertension Established with cardiology, Dr. Allyson Sabal Reports compliances with medications.  Denies shortness of breath, chest pain, palpitations, or changes to vision.   8. History of CVA (cerebrovascular accident) Reports compliance with medications. Does not follow with neurology. Established with cardiology   Relevant past medical, surgical, family, and social history reviewed and updated as indicated.  Allergies and medications reviewed and updated. Data reviewed: Chart in Epic.   Past Medical History:  Diagnosis Date   Acute blood loss anemia 05/04/2020   Arthritis    "fingers, some in my knees" (01/28/2016)   CAD (coronary artery disease), per cath 04/27/13, non obstructive disease, EF 65-70% 05/11/2013   Carotid disease, bilateral (HCC) 09/20/2013   Lt carotid 50-69% stentosis, less on rt   Cellulitis of left leg 01/16/2022   Dysrhythmia    Elevated troponin 05/04/2020   Encounter for support and coordination of transition of care 11/11/2021   Fatty liver    GERD (gastroesophageal reflux disease)    Glaucoma    H/O cardiovascular stress test 12/27/2009   negative for ischemia   Headache    "occasional since eye pressure regulated" (01/28/2016)   Hospital discharge follow-up 10/15/2021   Hyperlipidemia    Hypertension    Hypertensive emergency 11/28/2022   Leukocytosis 07/15/2015   Migraine    "stopped w/laser holes to relieve pressure in my eyes; had them 3-4 times/wk before taht" (01/28/2016)   Mitral regurgitation,2+ by cath 05/11/2013   NSTEMI (non-ST elevated myocardial  infarction) (HCC) 04/27/2013   Pain    BOTH KNEES - PT HAS TORN MENISCUS LEFT KNEE   Pain in both wrists 09/03/2021   Pain in left knee 10/03/2018   Paroxysmal atrial fibrillation (HCC)    hx of a fib on ASA  AND ELIQUIS    Polycythemia vera (HCC) 08/20/2015   JAK-2 positive 07/19/15   Prosthesis adjustment 01/13/2019   PVD (peripheral vascular disease)  with claudication (HCC) 07/21/2006   previous L ext iliac stent and rt SFA stent, known occluded Lt SFA   S/P cardiac cath 04/27/2013   NON OBSTRUCTIVE DISEASE, 2+ mr, ef 65-70%   Statin intolerance    Stroke (HCC) 12/21/2004   SWELLING ALL OVER AND RT SIDE OF FACE DRAWN AND SPEECH SLURRED AND NUMBNESS ON RIGHT SIDE-- ALL RESOLVED   TIA (transient ischemic attack)     Past Surgical History:  Procedure Laterality Date   AMPUTATION Right 05/21/2017   Procedure: AMPUTATION ABOVE KNEE;  Surgeon: Larina Earthly, MD;  Location: [facility] OR;  Service: Vascular;  Laterality: Right;   CARDIAC CATHETERIZATION  04/27/13   non occlusive disease with mild to mod. calcified lesions in ostial LM and proximal LAD and moderate prox. RC AND DISTAL AV GROOVE LCX, ef   CATARACT EXTRACTION W/PHACO Right 03/21/2015   Procedure: CATARACT EXTRACTION PHACO AND INTRAOCULAR LENS PLACEMENT RIGHT EYE;  Surgeon: Fabio Pierce, MD;  Location: [facility] ORS;  Service: Ophthalmology;  Laterality: Right;  CDE:5.10   CATARACT EXTRACTION W/PHACO Left 05/16/2015   Procedure: CATARACT EXTRACTION PHACO AND INTRAOCULAR LENS PLACEMENT (IOC);  Surgeon: Fabio Pierce, MD;  Location: [facility] ORS;  Service: Ophthalmology;  Laterality: Left;  CDE 5.57   CESAREAN SECTION  1977   COLONOSCOPY N/A 09/11/2016   Procedure: COLONOSCOPY;  Surgeon: Malissa Hippo, MD;  Location: [facility] ENDO SUITE;  Service: Endoscopy;  Laterality: N/A;  730-moved to 1:00 Ann notified pt   EMBOLECTOMY Right 02/01/2016   Procedure: Thrombectomy of Right Femoral-Popliteal Bypass Graft; Endarterectomy of Right Below Knee Popliteal Artery and Tibial Peroneal Trunk with Bovine Pericardium Patch Angioplasty.;  Surgeon: Chuck Hint, MD;  Location[facility] OR;  Service: Vascular;  Laterality: Right;   FEMORAL-POPLITEAL BYPASS GRAFT Right 01/31/2016   Procedure: BYPASS GRAFT RIGHT COMMON  FEMORALTO BELOW KNEE POPLITEAL ARTERY BYPASS GRAFT USING PROPATEN GORTEX GRAFT;  Surgeon: Pryor Ochoa,  MD;  Location: [facility] OR;  Service: Vascular;  Laterality: Right;   FEMORAL-POPLITEAL BYPASS GRAFT Right 01/09/2017   Procedure: THROMBECTOMY OF RIGHT FEMORAL-POPLITEAL  ARTERY BYPASS GRAFT; THROMBECTOMY RIGHT  TIBIAL VESSELS;  Surgeon: Sherren Kerns, MD;  Location: [facility] OR;  Service: Vascular;  Laterality: Right;   FEMORAL-POPLITEAL BYPASS GRAFT Right 05/17/2017   Procedure: RIGHT FEMORAL-TIBIAL PERONEAL TRUNK ARTERY BYPASS GRAFT USING 94mmX80cm PROPATEN GRAFT WITH REMOVABLE RING;  Surgeon: Larina Earthly, MD;  Location: [facility] OR;  Service: Vascular;  Laterality: Right;   FRACTURE SURGERY     ILIAC ARTERY STENT  07/2006   external iliac and Rt SFA stent 07/2006 AND THE LEFT WAS IN 2006   KNEE ARTHROSCOPY WITH MEDIAL MENISECTOMY Left 03/27/2014   Procedure: left knee arthorscopy with medial chondraplasty of the medial femoral and patella, medial microfracture technique of medial femoral condyl;  Surgeon: Jacki Cones, MD;  Location: [facility] ORS;  Service: Orthopedics;  Laterality: Left;   LEFT HEART CATHETERIZATION WITH CORONARY ANGIOGRAM Right 04/27/2013   Procedure: LEFT HEART CATHETERIZATION WITH CORONARY ANGIOGRAM;  Surgeon: Marykay Lex, MD;  Location: [facility] CATH  LAB;  Service: Cardiovascular;  Laterality: Right;   LOWER EXTREMITY ANGIOGRAM Right 01/31/2016   Procedure: INTRAOP RIGHT LOWER EXTREMITY ANGIOGRAM;  Surgeon: Pryor Ochoa, MD;  Location: [facility] OR;  Service: Vascular;  Laterality: Right;   ORIF WRIST FRACTURE Right 1983   OVARIAN CYST REMOVAL Right    PATCH ANGIOPLASTY Right 01/31/2016   Procedure: RIGHT COMMON FEMORAL AND PROFUNDA FEMORIS ENDARECTOMY WITH PATCH ANGIOPLASTY;  Surgeon: Pryor Ochoa, MD;  Location: [facility] OR;  Service: Vascular;  Laterality: Right;   PATCH ANGIOPLASTY Right 01/09/2017   Procedure: PATCH ANGIOPLASTY RIGHT POPLITEAL ARTERY BYPASS GRAFT;  Surgeon: Sherren Kerns, MD;  Location: [facility] OR;  Service: Vascular;  Laterality: Right;   PERIPHERAL VASCULAR CATHETERIZATION N/A 06/27/2015    Procedure: Lower Extremity Angiography;  Surgeon: Runell Gess, MD;  [facility]: [facility] INVASIVE CV LAB;  Service: Cardiovascular;  Laterality: N/A;   PERIPHERAL VASCULAR CATHETERIZATION Bilateral 01/29/2016   Procedure: Lower Extremity Angiography;  Surgeon: Iran Ouch, MD;  Location: [facility] INVASIVE CV LAB;  Service: Cardiovascular;  Laterality: Bilateral;   PERIPHERAL VASCULAR CATHETERIZATION N/A 01/29/2016   Procedure: Abdominal Aortogram;  Surgeon: Iran Ouch, MD;  [facility]: [facility] INVASIVE CV LAB;  Service: Cardiovascular;  Laterality: N/A;   REFRACTIVE SURGERY Bilateral    "6 in one eye; 7 in the other; to relieve pressure; not glaucoma" (01/28/2016)   SFA Right 06/27/2015   overlapping Bahn covered stents   TUBAL LIGATION  1983   VEIN HARVEST Left 05/17/2017   Procedure: LEFT LEG GREATER SAPHENOUS VEIN HARVEST;  Surgeon: Larina Earthly, MD;  Location: [facility] OR;  Service: Vascular;  Laterality: Left;   VEIN REPAIR Right 01/31/2016   Procedure: RIGHT GREATER SAPHENOUS VEIN EXAMNED BUT NOT REMOVED;  Surgeon: Pryor Ochoa, MD;  Location: [facility] OR;  Service: Vascular;  Laterality: Right;    Social History   Socioeconomic History   Marital status: Widowed    Spouse name: Not on file   Number of children: 2   Years of education: 63   Highest education level: GED or equivalent  Occupational History   Occupation: CNA  Tobacco Use   Smoking status: Former    Current packs/day: 0.00    Average packs/day: 1 pack/day for 45.0 years (45.0 ttl pk-yrs)    Types: Cigarettes    Start date: 11/12/1967    Quit date: 11/11/2012    Years since quitting: 10.8   Smokeless tobacco: Never   Tobacco comments:    quit 4 years ago.    10/27/21-Pt declines lung ca screen at this time.   Vaping Use   Vaping status: Never Used  Substance and Sexual Activity   Alcohol use: No    Alcohol/week: 0.0 standard drinks of alcohol   Drug use: No   Sexual activity: Not Currently    Birth control/protection: None   Other Topics Concern   Not on file  Social History Narrative   Husband passed away after CVA in 11-23-05.   2 children, 5 grandchildren and 1 great grandchild.   Social Determinants of Health   Financial Resource Strain: Low Risk  (11/27/2022)   Overall Financial Resource Strain (CARDIA)    Difficulty of Paying Living Expenses: Not hard at all  Food Insecurity: No Food Insecurity (06/22/2023)   Hunger Vital Sign    Worried About Running Out of Food in the Last Year: Never true    Ran Out of Food in the Last Year: Never true  Transportation Needs: No Transportation  Needs (06/22/2023)   PRAPARE - Administrator, Civil Service (Medical): No    Lack of Transportation (Non-Medical): No  Physical Activity: Inactive (06/22/2023)   Exercise Vital Sign    Days of Exercise per Week: 0 days    Minutes of Exercise per Session: 30 min  Stress: No Stress Concern Present (06/22/2023)   [employer] of Occupational Health - Occupational Stress Questionnaire    Feeling of Stress : Not at all  Social Connections: Moderately Isolated (06/22/2023)   Social Connection and Isolation Panel [NHANES]    Frequency of Communication with Friends and Family: More than three times a week    Frequency of Social Gatherings with Friends and Family: More than three times a week    Attends Religious Services: 1 to 4 times per year    Active Member of [employer] or Organizations: No    Attends Banker Meetings: Never    Marital Status: Widowed  Intimate Partner Violence: Not At Risk (11/28/2022)   Humiliation, Afraid, Rape, and Kick questionnaire    Fear of Current or Ex-Partner: No    Emotionally Abused: No    Physically Abused: No    Sexually Abused: No    Outpatient Encounter Medications as of 09/14/2023  Medication Sig   acetaminophen (TYLENOL) 500 MG tablet Take 2 tablets (1,000 mg total) by mouth every 6 (six) hours.   ARTIFICIAL TEAR OP Place 1 drop into both eyes as needed (for dry eyes).    aspirin EC 325 MG tablet Take 325 mg by mouth daily.   diclofenac Sodium (VOLTAREN) 1 % GEL Apply 2 g topically 4 (four) times daily. (Patient taking differently: Apply 2 g topically 4 (four) times daily as needed (pain).)   diltiazem (CARDIZEM CD) 240 MG 24 hr capsule TAKE ONE CAPSULE EVERY MORNING. NEEDS TO BE SEEN FOR REFILLS   ELIQUIS 5 MG TABS tablet TAKE ONE TABLET TWICE DAILY. NEEDS TO BE SEEN FOR REFILLS   escitalopram (LEXAPRO) 20 MG tablet TAKE ONE TABLET ONCE DAILY. NEEDS TO BE SEEN FOR REFILLS   ezetimibe (ZETIA) 10 MG tablet TAKE ONE TABLET ONCE DAILY. NEEDS TO BE SEEN FOR REFILLS   fenofibrate (TRICOR) 145 MG tablet TAKE ONE TABLET DAILY   flecainide (TAMBOCOR) 50 MG tablet TAKE ONE TABLET BY MOUTH TWICE DAILY   Multiple Vitamins-Minerals (CENTRUM SILVER ADULT 50+ PO) Take by mouth.   omeprazole (PRILOSEC) 20 MG capsule TAKE ONE CAPSULE TWICE DAILY BEFORE MEALS   triamcinolone cream (KENALOG) 0.1 % Apply 1 Application topically 2 (two) times daily.   valsartan (DIOVAN) 160 MG tablet TAKE ONE TABLET EVERY EVENING (WITH 320MG  IN THE MORNING)   valsartan (DIOVAN) 320 MG tablet TAKE ONE TABLET EVERY MORNING (WITH 160MG  EVERY EVENING)   No facility-administered encounter medications on file as of 09/14/2023.    Allergies  Allergen Reactions   Atenolol Rash and Itching   Demerol  [Meperidine Hcl] Itching   Demerol [Meperidine] Other (See Comments) and Itching    Unknown, can't remember  Unknown, can't remember    Gabapentin Anxiety and Other (See Comments)    SI and HI   Inderal [Propranolol] Other (See Comments) and Itching    Doesn't remember    Prednisone Other (See Comments)    Muscle spasms   Statins Itching and Other (See Comments)    Simvastatin-caused severe itching Pravastatin-caused lesser tiching One type of statin? Caused stroke in 2006, and other symptoms as result   Warfarin And Related Other (  See Comments)    Caused nose bleeds   Crestor [Rosuvastatin]  Itching and Rash   Docosahexaenoic Acid (Dha)     Other reaction(s): Other (See Comments) nosebleeds   Lactose Intolerance (Gi) Other (See Comments)    Bloating, gas   Lipitor [Atorvastatin] Rash   Lovaza [Omega-3-Acid Ethyl Esters] Other (See Comments)    nosebleeds    Penicillins Hives, Rash and Other (See Comments)    Has patient had a PCN reaction causing immediate rash, facial/tongue/throat swelling, SOB or lightheadedness with hypotension: Yes Has patient had a PCN reaction causing severe rash involving mucus membranes or skin necrosis: No Has patient had a PCN reaction that required hospitalization: No Has patient had a PCN reaction occurring within the last 10 years: No If all of the above answers are "NO", then may proceed with Cephalosporin use.  Questionable high fever    Pravastatin Itching and Rash   Simvastatin Itching, Rash and Other (See Comments)   Trilipix [Choline Fenofibrate] Other (See Comments)    Doesn't remember    Warfarin Other (See Comments)    nosebleeds   Hydralazine Swelling   Effexor [Venlafaxine Hcl] Other (See Comments)    Doesn't remember    Nexium [Esomeprazole Magnesium] Rash   Percocet [Oxycodone-Acetaminophen] Other (See Comments)    Doesn't remember    Plavix [Clopidogrel Bisulfate] Rash    Review of Systems As per HPI  Objective:  BP 129/70 (BP Location: Left Arm)   Pulse 73   Temp 98 F (36.7 C)   Ht 5\' 4"  (1.626 m)   Wt 122 lb (55.3 kg)   SpO2 94%   BMI 20.94 kg/m    Wt Readings from Last 3 Encounters:  09/14/23 122 lb (55.3 kg)  08/31/23 124 lb (56.2 kg)  08/18/23 124 lb 14.4 oz (56.7 kg)    Physical Exam Constitutional:      General: She is awake. She is not in acute distress.    Appearance: Normal appearance. She is well-developed and well-groomed. She is not ill-appearing, toxic-appearing or diaphoretic.  Cardiovascular:     Rate and Rhythm: Normal rate and regular rhythm.     Pulses: Normal pulses.           Radial pulses are 2+ on the right side and 2+ on the left side.       Posterior tibial pulses are 2+ on the right side and 2+ on the left side.     Heart sounds: Murmur heard.     Systolic murmur is present with a grade of 2/6.     No gallop.  Pulmonary:     Effort: Pulmonary effort is normal. No respiratory distress.     Breath sounds: Normal breath sounds. No stridor. No wheezing, rhonchi or rales.  Musculoskeletal:     Cervical back: Full passive range of motion without pain and neck supple.     Right lower leg: 1+ Edema present.     Left lower leg: No edema.     Comments: Right AKA with prosthesis   Skin:    General: Skin is warm.     Capillary Refill: Capillary refill takes less than 2 seconds.          Comments: Left lower extremity with erythema, edema, warm to touch   Neurological:     General: No focal deficit present.     Mental Status: She is alert, oriented to person, place, and time and easily aroused. Mental status is at baseline.  GCS: GCS eye subscore is 4. GCS verbal subscore is 5. GCS motor subscore is 6.     Motor: No weakness.  Psychiatric:        Attention and Perception: Attention and perception normal.        Mood and Affect: Mood and affect normal.        Speech: Speech normal.        Behavior: Behavior normal. Behavior is cooperative.        Thought Content: Thought content normal. Thought content does not include homicidal or suicidal ideation. Thought content does not include homicidal or suicidal plan.        Cognition and Memory: Cognition and memory normal.        Judgment: Judgment normal.    Results for orders placed or performed during the hospital encounter of 08/31/23  Urine Culture   Specimen: Urine, Random  Result Value Ref Range   Specimen Description      URINE, RANDOM Performed at [facility], [address], [place], [place] [address]    Special Requests      NONE Reflexed from [id number] Performed at [facility], [address], [place], [place] [address]    Culture      NO GROWTH Performed at [facility], [address], [place], [place] [address]    Report Status 09/02/2023 FINAL   Lactic acid, plasma  Result Value Ref Range   Lactic Acid, Venous 1.4 0.5 - 1.9 mmol/L  Comprehensive metabolic panel  Result Value Ref Range   Sodium 136 135 - 145 mmol/L   Potassium 4.5 3.5 - 5.1 mmol/L   Chloride 109 98 - 111 mmol/L   CO2 17 (L) 22 - 32 mmol/L   Glucose, Bld 142 (H) 70 - 99 mg/dL   BUN 36 (H) 8 - 23 mg/dL   Creatinine, Ser 0.34 (H) 0.44 - 1.00 mg/dL   Calcium 7.6 (L) 8.9 - 10.3 mg/dL   Total Protein 5.8 (L) 6.5 - 8.1 g/dL   Albumin 2.9 (L) 3.5 - 5.0 g/dL   AST 28 15 - 41 U/L   ALT 20 0 - 44 U/L   Alkaline Phosphatase 79 38 - 126 U/L   Total Bilirubin 0.5 0.3 - 1.2 mg/dL   GFR, Estimated 35 (L) >60 mL/min   Anion gap 10 5 - 15  CBC with Differential  Result Value Ref Range   WBC 25.1 (H) 4.0 - 10.5 K/uL   RBC 5.39 (H) 3.87 - 5.11 MIL/uL   Hemoglobin 11.8 (L) 12.0 - 15.0 g/dL   HCT 74.2 59.5 - 63.8 %   MCV 77.0 (L) 80.0 - 100.0 fL   MCH 21.9 (L) 26.0 - 34.0 pg   MCHC 28.4 (L) 30.0 - 36.0 g/dL   RDW 75.6 (H) 43.3 - 29.5 %   Platelets 363 150 - 400 K/uL   nRBC 0.5 (H) 0.0 - 0.2 %   Neutrophils Relative % 89 %   Neutro Abs 22.6 (H) 1.7 - 7.7 K/uL   Band Neutrophils 1 %   Lymphocytes Relative 3 %   Lymphs Abs 0.8 0.7 - 4.0 K/uL   Monocytes Relative 1 %   Monocytes Absolute 0.3 0.1 - 1.0 K/uL   Eosinophils Relative 3 %   Eosinophils Absolute 0.8 (H) 0.0 - 0.5 K/uL   Basophils Relative 0 %   Basophils Absolute 0.0 0.0 - 0.1 K/uL   WBC Morphology Mild Left Shift (1-5% metas, occ myelo)  RBC Morphology MORPHOLOGY UNREMARKABLE    Smear Review GIANT PLATELETS SEEN    nRBC 1 (H) 0 /100 WBC   Metamyelocytes Relative 1 %   Myelocytes 2 %   Abs Immature Granulocytes 0.80 (H) 0.00 - 0.07 K/uL   Reactive, Benign Lymphocytes PRESENT   Urinalysis, w/ Reflex to Culture (Infection Suspected)  -Urine, Clean Catch  Result Value Ref Range   Specimen Source URINE, CLEAN CATCH    Color, Urine YELLOW YELLOW   APPearance CLEAR CLEAR   Specific Gravity, Urine 1.017 1.005 - 1.030   pH 6.0 5.0 - 8.0   Glucose, UA 50 (A) NEGATIVE mg/dL   Hgb urine dipstick NEGATIVE NEGATIVE   Bilirubin Urine NEGATIVE NEGATIVE   Ketones, ur NEGATIVE NEGATIVE mg/dL   Protein, ur >=401 (A) NEGATIVE mg/dL   Nitrite NEGATIVE NEGATIVE   Leukocytes,Ua SMALL (A) NEGATIVE   RBC / HPF 0-5 0 - 5 RBC/hpf   WBC, UA >50 0 - 5 WBC/hpf   Bacteria, UA NONE SEEN NONE SEEN   Squamous Epithelial / HPF 0-5 0 - 5 /HPF       09/14/2023   10:47 AM 06/23/2023   11:17 AM 02/16/2023   10:14 AM 02/09/2023    2:09 PM 12/25/2022   12:02 PM  Depression screen PHQ 2/9  Decreased Interest 0 0 0 0 0  Down, Depressed, Hopeless 0 0 0 0 0  PHQ - 2 Score 0 0 0 0 0  Altered sleeping 0 0 0  0  Tired, decreased energy 0 0 0  0  Change in appetite 0 0 0  0  Feeling bad or failure about yourself  0 0 0  0  Trouble concentrating 0 0 0  0  Moving slowly or fidgety/restless 0 0 0  0  Suicidal thoughts 0 0 0  0  PHQ-9 Score 0 0 0  0  Difficult doing work/chores Not difficult at all Not difficult at all Not difficult at all  Not difficult at all       09/14/2023   10:48 AM 06/23/2023   11:17 AM 02/16/2023   10:14 AM 12/25/2022   12:02 PM  GAD 7 : Generalized Anxiety Score  Nervous, Anxious, on Edge 0 0 0 0  Control/stop worrying 0 0 0 0  Worry too much - different things 0 0 0 0  Trouble relaxing 0 0 0 0  Restless 0 0 0 0  Easily annoyed or irritable 0 0 0 0  Afraid - awful might happen 0 0 0 0  Total GAD 7 Score 0 0 0 0  Anxiety Difficulty Somewhat difficult Not difficult at all Not difficult at all Not difficult at all   Pertinent labs & imaging results that were available during my care of the patient were reviewed by me and considered in my medical decision making.  Assessment & Plan:  Kindal was seen today for medical  management of chronic issues.  Diagnoses and all orders for this visit:  Chronic atrial fibrillation (HCC) One month refills provided as below. Stressed to patient that these medications and care of these conditions need to come from her cardiologist.  Referral placed as below for patient to follow up sooner than next year with cardiology to receive refills.  -     apixaban (ELIQUIS) 5 MG TABS tablet; Take 1 tablet (5 mg total) by mouth 2 (two) times daily. -     flecainide (TAMBOCOR) 50 MG tablet; Take 1 tablet (50 mg  total) by mouth 2 (two) times daily. -     Ambulatory referral to Cardiology  Stage 3b chronic kidney disease [facility]) Referral placed as below.  -     Ambulatory referral to Nephrology  Amputee, above knee, right (HCC) Stable, continue with pain management at pain clinic. Continue with prosthetic   Polycythemia vera (HCC) Established with oncology.  Per notes, patient to continue with lab monitoring and potential phlebotomy every 3 months as needed, follow up in 6 months with oncology   PAD (peripheral artery disease), Lt carotid 50-70%: , Hx stent Lt exteral iliac & RSFA stent, known occl. LSFA  Stressed to patient that care of this condition need to come from her cardiologist.  Referral placed as below for patient to follow up sooner than next year with cardiology to receive refills.  -     Ambulatory referral to Cardiology  Cellulitis of left lower extremity Will extend course of antibiotics as below. Patient to follow up if symptoms are not improving.  -     doxycycline (VIBRA-TABS) 100 MG tablet; Take 1 tablet (100 mg total) by mouth 2 (two) times daily for 7 days.  Primary hypertension Well controlled in office today.  Stressed to patient that these medications and care of these conditions need to come from her cardiologist.  Referral placed as below for patient to follow up sooner than next year with cardiology to receive refills.  -     diltiazem (CARDIZEM CD)  240 MG 24 hr capsule; Take 1 capsule (240 mg total) by mouth daily. -     Ambulatory referral to Cardiology  History of CVA (cerebrovascular accident) One month refills provided as below. Stressed to patient that these medications and care of these conditions need to come from her cardiologist.  Referral placed as below for patient to follow up sooner than next year with cardiology to receive refills. Patient remains on high dose of ASA and eliquis. Appreciate guidance from cardiology with anticoagulation.  -     ezetimibe (ZETIA) 10 MG tablet; Take 1 tablet (10 mg total) by mouth daily. -     Ambulatory referral to Cardiology  Dyslipidemia One month refills provided as below. Stressed to patient that these medications and care of these conditions need to come from her cardiologist.  Referral placed as below for patient to follow up sooner than next year with cardiology to receive refills.  -     ezetimibe (ZETIA) 10 MG tablet; Take 1 tablet (10 mg total) by mouth daily. -     fenofibrate (TRICOR) 145 MG tablet; Take 1 tablet (145 mg total) by mouth daily. -     Ambulatory referral to Cardiology  Continue all other maintenance medications.  Follow up plan: Return in about 3 months (around 12/14/2023) for Chronic Condition Follow up.   Continue healthy lifestyle choices, including diet (rich in fruits, vegetables, and lean proteins, and low in salt and simple carbohydrates) and exercise (at least 30 minutes of moderate physical activity daily).  Written and verbal instructions provided   The above assessment and management plan was discussed with the patient. The patient verbalized understanding of and has agreed to the management plan. Patient is aware to call the clinic if they develop any new symptoms or if symptoms persist or worsen. Patient is aware when to return to the clinic for a follow-up visit. Patient educated on when it is appropriate to go to the emergency department.    Neale Burly, DNP-FNP [facility] [address] [place] [address], [place] [address] [phone]

## 2023-09-14 NOTE — Patient Instructions (Signed)
Needs to follow up with Cardiology - Dr. Allyson Sabal

## 2023-09-30 ENCOUNTER — Telehealth: Payer: Self-pay

## 2023-09-30 NOTE — Telephone Encounter (Signed)
Transition Care Management Unsuccessful Follow-up Telephone Call  Date of discharge and from where:  09/01/2023 [facility]  Attempts:  1st Attempt  Reason for unsuccessful TCM follow-up call:  No answer/busy  Gloriajean Okun [facility]  [facility] Resource Care Guide Direct Dial: [phone]  Website: [email or web address]

## 2023-10-01 ENCOUNTER — Telehealth: Payer: Self-pay

## 2023-10-01 NOTE — Telephone Encounter (Signed)
Transition Care Management Follow-up Telephone Call Date of discharge and from where: 09/01/2023 [facility] How have you been since you were released from the hospital? Patient stated she is feeling fine. Any questions or concerns? No  Items Reviewed: Did the pt receive and understand the discharge instructions provided? Yes  Medications obtained and verified? Yes  Other? No  Any new allergies since your discharge? No  Dietary orders reviewed? Yes Do you have support at home? Yes   Follow up appointments reviewed:  PCP Hospital f/u appt confirmed? Yes  Scheduled to see Arrie Senate, FNP on 09/14/2023 @ [facility]. Specialist Hospital f/u appt confirmed? Yes  Scheduled to see Irving Burton C. Monge, NP on 10/22/2023 @ [facility] Are transportation arrangements needed? No  If their condition worsens, is the pt aware to call PCP or go to the Emergency Dept.? Yes Was the patient provided with contact information for the PCP's office or ED? Yes Was to pt encouraged to call back with questions or concerns? Yes   Anneli Bing [facility]  [facility] Guide Direct Dial: [phone]  Website: [email or web address]

## 2023-10-18 ENCOUNTER — Other Ambulatory Visit: Payer: Self-pay | Admitting: Family Medicine

## 2023-10-18 DIAGNOSIS — K219 Gastro-esophageal reflux disease without esophagitis: Secondary | ICD-10-CM

## 2023-10-19 ENCOUNTER — Other Ambulatory Visit: Payer: Self-pay | Admitting: Family Medicine

## 2023-10-19 DIAGNOSIS — I482 Chronic atrial fibrillation, unspecified: Secondary | ICD-10-CM

## 2023-10-19 DIAGNOSIS — E785 Hyperlipidemia, unspecified: Secondary | ICD-10-CM

## 2023-10-19 DIAGNOSIS — I10 Essential (primary) hypertension: Secondary | ICD-10-CM

## 2023-10-19 DIAGNOSIS — Z8673 Personal history of transient ischemic attack (TIA), and cerebral infarction without residual deficits: Secondary | ICD-10-CM

## 2023-10-20 ENCOUNTER — Other Ambulatory Visit (HOSPITAL_COMMUNITY): Payer: Self-pay | Admitting: Nephrology

## 2023-10-20 DIAGNOSIS — N1832 Chronic kidney disease, stage 3b: Secondary | ICD-10-CM

## 2023-10-21 NOTE — Progress Notes (Deleted)
Cardiology Office Note    Date:  10/21/2023  ID:  [name], DOB 1951/04/26, MRN [id number] PCP:  Arrie Senate, FNP  Cardiologist:  Nanetta Batty, MD  Electrophysiologist:  None   Chief Complaint: Questions regarding medications   History of Present Illness: .    [name] is a 72 y.o. female with visit-pertinent history of essential hypertension, peripheral arterial disease, paroxysmal atrial fibrillation, coronary artery disease, stroke/TIA, carotid artery stenosis, mixed hyperlipidemia, hypertension and multiple medication allergies.  [name] has history of PVD with stenting to her left external iliac artery as well as right SFA stenting by Dr. Gery Pray in 07/2006.  She was noted to have an occluded left SFA.  In January 2011 she underwent Myoview that was nonischemic. In 2014 she underwent cardiac catheterization performed by Dr. Herbie Baltimore revealing nonobstructive coronary artery disease, on presentation she was in afib, was started on cardizem, Eliquis and Flecainide.  In 2016 Dr. Allyson Sabal performed angiography revealing an occluded right SFA stent which was recanalized and restented using overlapping covered stents.  In 2017 she underwent urgent angiography in setting of critical limb ischemia on the right sid with Dr. Kirke Corin revealing an occluded right SFA stent.  She underwent urgent right femoral-popliteal bypass grafting by Dr. Hart Rochester.  In 2018 she was noted to have thrombosis of her graft with thrombectomy with Dr. 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  On chart review patient presented to the ED in 09/2021 with slurred speech and left-sided facial droop, she had weakness along her arms and legs bilaterally, while in the ED her symptoms started resolved on their own and she did not receive tPA.  CT head showed no acute intracranial abnormalities, brain MRI showed a tiny acute to subacute infarct in the right centrum semiovale, was noted to have moderate  chronic white matter microangiopathy and multifocal irregularity and narrowing of the right A2 segment and moderate focal stenosis of the left A2 segment.  Echocardiogram at that time showed hyperdynamic LV function with EF of 70 to 75%, was noted to have severe asymmetric hypertrophy of the basal septum and mid cavitary gradient of 15 to 17 mmHg but no signs of obstruction.  Findings were felt to be consistent with hypertrophic cardiomyopathy and a nonemergent cardiac MRI was recommended for further evaluation, does not appear this was completed. In 10/2021 she was again admitted in setting of TIA, she was continued on aspirin 325 mg once daily and Eliquis 5 mg twice daily.  Her repeat echocardiogram on 11/04/2021 showed LVEF 70 to 75%, LV with hyperdynamic function, LV with no RWMA, there was severe asymmetric LVH of the basal and septal segment.  There was mitral chordal SAM with mild LVOT gradient.  She was last seen in clinic by Dr. Gery Pray on 03/02/2023.  She had remained in sinus rhythm on flecainide and Eliquis.  Hypertrophic cardiomyopathy?: Echocardiogram on 11/04/2021 showed LVEF 70 to 75%, LV with hyperdynamic function, LV with no RWMA, there was severe asymmetric LVH of the basal and septal segment.  There was mitral chordal SAM with mild LVOT gradient. It was recommended that she have cardiac MRI, this is not appear completed. Check cardiac MRI.   PAD: History of PAD s/p left external iliac artery PTA and stenting as well as right SFA stenting by Dr. Gery Pray in 2007.  She has a known occluded left SFA.  She has had multiple interventions on her right SFA including stenting for in-stent restenosis with documentation of  Cardiology Office Note    Date:  10/21/2023  ID:  [name], DOB 1951/04/26, MRN [id number] PCP:  Arrie Senate, FNP  Cardiologist:  Nanetta Batty, MD  Electrophysiologist:  None   Chief Complaint: Questions regarding medications   History of Present Illness: .    [name] is a 72 y.o. female with visit-pertinent history of essential hypertension, peripheral arterial disease, paroxysmal atrial fibrillation, coronary artery disease, stroke/TIA, carotid artery stenosis, mixed hyperlipidemia, hypertension and multiple medication allergies.  [name] has history of PVD with stenting to her left external iliac artery as well as right SFA stenting by Dr. Gery Pray in 07/2006.  She was noted to have an occluded left SFA.  In January 2011 she underwent Myoview that was nonischemic. In 2014 she underwent cardiac catheterization performed by Dr. Herbie Baltimore revealing nonobstructive coronary artery disease, on presentation she was in afib, was started on cardizem, Eliquis and Flecainide.  In 2016 Dr. Allyson Sabal performed angiography revealing an occluded right SFA stent which was recanalized and restented using overlapping covered stents.  In 2017 she underwent urgent angiography in setting of critical limb ischemia on the right sid with Dr. Kirke Corin revealing an occluded right SFA stent.  She underwent urgent right femoral-popliteal bypass grafting by Dr. Hart Rochester.  In 2018 she was noted to have thrombosis of her graft with thrombectomy with Dr. 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  On chart review patient presented to the ED in 09/2021 with slurred speech and left-sided facial droop, she had weakness along her arms and legs bilaterally, while in the ED her symptoms started resolved on their own and she did not receive tPA.  CT head showed no acute intracranial abnormalities, brain MRI showed a tiny acute to subacute infarct in the right centrum semiovale, was noted to have moderate  chronic white matter microangiopathy and multifocal irregularity and narrowing of the right A2 segment and moderate focal stenosis of the left A2 segment.  Echocardiogram at that time showed hyperdynamic LV function with EF of 70 to 75%, was noted to have severe asymmetric hypertrophy of the basal septum and mid cavitary gradient of 15 to 17 mmHg but no signs of obstruction.  Findings were felt to be consistent with hypertrophic cardiomyopathy and a nonemergent cardiac MRI was recommended for further evaluation, does not appear this was completed. In 10/2021 she was again admitted in setting of TIA, she was continued on aspirin 325 mg once daily and Eliquis 5 mg twice daily.  Her repeat echocardiogram on 11/04/2021 showed LVEF 70 to 75%, LV with hyperdynamic function, LV with no RWMA, there was severe asymmetric LVH of the basal and septal segment.  There was mitral chordal SAM with mild LVOT gradient.  She was last seen in clinic by Dr. Gery Pray on 03/02/2023.  She had remained in sinus rhythm on flecainide and Eliquis.  Hypertrophic cardiomyopathy?: Echocardiogram on 11/04/2021 showed LVEF 70 to 75%, LV with hyperdynamic function, LV with no RWMA, there was severe asymmetric LVH of the basal and septal segment.  There was mitral chordal SAM with mild LVOT gradient. It was recommended that she have cardiac MRI, this is not appear completed. Check cardiac MRI.   PAD: History of PAD s/p left external iliac artery PTA and stenting as well as right SFA stenting by Dr. Gery Pray in 2007.  She has a known occluded left SFA.  She has had multiple interventions on her right SFA including stenting for in-stent restenosis with documentation of  Cardiology Office Note    Date:  10/21/2023  ID:  [name], DOB 1951/04/26, MRN [id number] PCP:  Arrie Senate, FNP  Cardiologist:  Nanetta Batty, MD  Electrophysiologist:  None   Chief Complaint: Questions regarding medications   History of Present Illness: .    [name] is a 72 y.o. female with visit-pertinent history of essential hypertension, peripheral arterial disease, paroxysmal atrial fibrillation, coronary artery disease, stroke/TIA, carotid artery stenosis, mixed hyperlipidemia, hypertension and multiple medication allergies.  [name] has history of PVD with stenting to her left external iliac artery as well as right SFA stenting by Dr. Gery Pray in 07/2006.  She was noted to have an occluded left SFA.  In January 2011 she underwent Myoview that was nonischemic. In 2014 she underwent cardiac catheterization performed by Dr. Herbie Baltimore revealing nonobstructive coronary artery disease, on presentation she was in afib, was started on cardizem, Eliquis and Flecainide.  In 2016 Dr. Allyson Sabal performed angiography revealing an occluded right SFA stent which was recanalized and restented using overlapping covered stents.  In 2017 she underwent urgent angiography in setting of critical limb ischemia on the right sid with Dr. Kirke Corin revealing an occluded right SFA stent.  She underwent urgent right femoral-popliteal bypass grafting by Dr. Hart Rochester.  In 2018 she was noted to have thrombosis of her graft with thrombectomy with Dr. 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  On chart review patient presented to the ED in 09/2021 with slurred speech and left-sided facial droop, she had weakness along her arms and legs bilaterally, while in the ED her symptoms started resolved on their own and she did not receive tPA.  CT head showed no acute intracranial abnormalities, brain MRI showed a tiny acute to subacute infarct in the right centrum semiovale, was noted to have moderate  chronic white matter microangiopathy and multifocal irregularity and narrowing of the right A2 segment and moderate focal stenosis of the left A2 segment.  Echocardiogram at that time showed hyperdynamic LV function with EF of 70 to 75%, was noted to have severe asymmetric hypertrophy of the basal septum and mid cavitary gradient of 15 to 17 mmHg but no signs of obstruction.  Findings were felt to be consistent with hypertrophic cardiomyopathy and a nonemergent cardiac MRI was recommended for further evaluation, does not appear this was completed. In 10/2021 she was again admitted in setting of TIA, she was continued on aspirin 325 mg once daily and Eliquis 5 mg twice daily.  Her repeat echocardiogram on 11/04/2021 showed LVEF 70 to 75%, LV with hyperdynamic function, LV with no RWMA, there was severe asymmetric LVH of the basal and septal segment.  There was mitral chordal SAM with mild LVOT gradient.  She was last seen in clinic by Dr. Gery Pray on 03/02/2023.  She had remained in sinus rhythm on flecainide and Eliquis.  Hypertrophic cardiomyopathy?: Echocardiogram on 11/04/2021 showed LVEF 70 to 75%, LV with hyperdynamic function, LV with no RWMA, there was severe asymmetric LVH of the basal and septal segment.  There was mitral chordal SAM with mild LVOT gradient. It was recommended that she have cardiac MRI, this is not appear completed. Check cardiac MRI.   PAD: History of PAD s/p left external iliac artery PTA and stenting as well as right SFA stenting by Dr. Gery Pray in 2007.  She has a known occluded left SFA.  She has had multiple interventions on her right SFA including stenting for in-stent restenosis with documentation of  acute stent occlusion requiring urgent right femoral popliteal bypass grafting by Dr. Hart Rochester and thrombectomy of the graft by Dr. Darrick Penna in 12/2016.  In 05/2017 she underwent right BKA.  She wears a prosthesis and ambulates with a walker.  Her most recent Doppler studies in 11/2022  revealed a left ABI of 0.43 with moderately elevated velocities in both external iliac arteries.  She is to have repeat PV duplexes in December.  PAF: History of PAF she has maintained sinus rhythm on flecainide, started in 2014.  CAD, nonobstructive: Per [facility] on 04/27/2013, she had 20 to 30% left main, 30% LAD, 50 to 60% OM 2 and 40% RCA stenosis.  EF 65 to 70%. Stable with no anginal symptoms. No indication for ischemic evaluation.    CVA/TIA: Patient has history of multiple strokes (2006 and 2022) and TIA's. She has been maintained on Eliquis 5 mg twice daily and aspirin 325 mg daily.   Hyperlipidemia: History of intolerance to statins.  Most recent lipid profile on 10/09/2022 indicated total cholesterol 162, HDL 40, triglycerides 192 and LDL 89.  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 Has therapeutic phlebotomy when HCT >46%.   CKD Stage: Most recent creatinine 1.57 on 08/31/23.   Carotid artery stenosis: Carotid duplex on 11/25/2022 indicated right ICA are consistent with 40 to 59% stenosis and left ICA consistent with a 40 to 59% stenosis. Dr. Allyson Sabal plans to repeat in 11/2023.   Labwork independently reviewed: 08/31/2023: Sodium 136, potassium 4.5, creatinine 1.57, hemoglobin 11.8, hematocrit 41.5  ROS: .   *** denies chest pain, shortness of breath, lower extremity edema, fatigue, palpitations, melena, hematuria, hemoptysis, diaphoresis, weakness, presyncope, syncope, orthopnea, and PND.  All other systems are reviewed and otherwise negative.  Studies Reviewed: Marland Kitchen    EKG:  EKG is ordered today, personally reviewed, demonstrating ***     CV Studies:  Cardiac Studies & Procedures       ECHOCARDIOGRAM  ECHOCARDIOGRAM COMPLETE 11/04/2021  Narrative ECHOCARDIOGRAM REPORT    Patient Name:   JERALENE MCEVER Date of Exam: 11/04/2021 Medical Rec #:  [id number]      Height:       64.5 in Accession #:    [id number]     Weight:        137.3 lb Date of Birth:  07-23-1951       BSA:          1.676 m Patient Age:    70 years       BP:           138/73 mmHg Patient Gender: F              HR:           64 bpm. [facility]am [facility]  Procedure: 2D Echo, Cardiac Doppler and Color Doppler  Indications:    TIA  History:        Patient has prior history of Echocardiogram examinations, most recent 10/06/2021. CAD and Previous Myocardial Infarction, PAD and TIA, Arrythmias:Atrial Fibrillation; Risk Factors:Hypertension and Dyslipidemia.  Sonographer:    Mikki Harbor Referring Phys: [id number] ASIA B ZIERLE-GHOSH  IMPRESSIONS   1. Left ventricular ejection fraction, by estimation, is 70 to 75%. The left ventricle has hyperdynamic function. The left ventricle has no regional wall motion abnormalities. There is severe asymmetric left ventricular hypertrophy of the basal and septal segments. Left ventricular diastolic parameters are consistent with Grade I diastolic dysfunction (impaired relaxation). There is

## 2023-10-22 ENCOUNTER — Ambulatory Visit: Payer: 59 | Admitting: Nurse Practitioner

## 2023-10-22 ENCOUNTER — Encounter: Payer: Self-pay | Admitting: *Deleted

## 2023-10-22 DIAGNOSIS — I739 Peripheral vascular disease, unspecified: Secondary | ICD-10-CM

## 2023-10-22 DIAGNOSIS — Z95828 Presence of other vascular implants and grafts: Secondary | ICD-10-CM

## 2023-10-22 DIAGNOSIS — E782 Mixed hyperlipidemia: Secondary | ICD-10-CM

## 2023-10-22 DIAGNOSIS — I6529 Occlusion and stenosis of unspecified carotid artery: Secondary | ICD-10-CM

## 2023-10-22 DIAGNOSIS — I480 Paroxysmal atrial fibrillation: Secondary | ICD-10-CM

## 2023-10-22 DIAGNOSIS — I2510 Atherosclerotic heart disease of native coronary artery without angina pectoris: Secondary | ICD-10-CM

## 2023-10-22 DIAGNOSIS — I10 Essential (primary) hypertension: Secondary | ICD-10-CM

## 2023-10-25 ENCOUNTER — Ambulatory Visit (HOSPITAL_COMMUNITY): Payer: 59

## 2023-10-28 ENCOUNTER — Ambulatory Visit (HOSPITAL_COMMUNITY): Payer: 59

## 2023-11-04 ENCOUNTER — Ambulatory Visit (HOSPITAL_COMMUNITY): Payer: 59

## 2023-11-04 ENCOUNTER — Ambulatory Visit (HOSPITAL_COMMUNITY)
Admission: RE | Admit: 2023-11-04 | Discharge: 2023-11-04 | Disposition: A | Discharge status: Home / Self Care | Payer: 59 | Source: Ambulatory Visit | Attending: Nephrology | Admitting: Nephrology

## 2023-11-04 DIAGNOSIS — N1832 Chronic kidney disease, stage 3b: Secondary | ICD-10-CM | POA: Insufficient documentation

## 2023-11-17 ENCOUNTER — Inpatient Hospital Stay: Payer: 59 | Attending: Hematology

## 2023-11-17 ENCOUNTER — Other Ambulatory Visit (HOSPITAL_COMMUNITY): Payer: Self-pay | Admitting: Cardiovascular Disease

## 2023-11-17 ENCOUNTER — Inpatient Hospital Stay: Payer: 59

## 2023-11-17 DIAGNOSIS — I708 Atherosclerosis of other arteries: Secondary | ICD-10-CM

## 2023-11-17 DIAGNOSIS — D500 Iron deficiency anemia secondary to blood loss (chronic): Secondary | ICD-10-CM | POA: Insufficient documentation

## 2023-11-17 DIAGNOSIS — D45 Polycythemia vera: Secondary | ICD-10-CM | POA: Insufficient documentation

## 2023-11-17 LAB — CBC WITH DIFFERENTIAL (CANCER CENTER ONLY)
Abs Immature Granulocytes: 1.49 10*3/uL — ABNORMAL HIGH (ref 0.00–0.07)
Basophils Absolute: 0.4 10*3/uL — ABNORMAL HIGH (ref 0.0–0.1)
Basophils Relative: 1 %
Eosinophils Absolute: 0.9 10*3/uL — ABNORMAL HIGH (ref 0.0–0.5)
Eosinophils Relative: 3 %
HCT: 42.8 % (ref 36.0–46.0)
Hemoglobin: 12.1 g/dL (ref 12.0–15.0)
Immature Granulocytes: 5 %
Lymphocytes Relative: 5 %
Lymphs Abs: 1.5 10*3/uL (ref 0.7–4.0)
MCH: 20.3 pg — ABNORMAL LOW (ref 26.0–34.0)
MCHC: 28.3 g/dL — ABNORMAL LOW (ref 30.0–36.0)
MCV: 71.9 fL — ABNORMAL LOW (ref 80.0–100.0)
Monocytes Absolute: 0.7 10*3/uL (ref 0.1–1.0)
Monocytes Relative: 2 %
Neutro Abs: 26.9 10*3/uL — ABNORMAL HIGH (ref 1.7–7.7)
Neutrophils Relative %: 84 %
Platelet Count: 406 10*3/uL — ABNORMAL HIGH (ref 150–400)
RBC: 5.95 MIL/uL — ABNORMAL HIGH (ref 3.87–5.11)
RDW: 22.1 % — ABNORMAL HIGH (ref 11.5–15.5)
WBC Count: 31.8 10*3/uL — ABNORMAL HIGH (ref 4.0–10.5)
nRBC: 0.4 % — ABNORMAL HIGH (ref 0.0–0.2)

## 2023-11-17 LAB — FERRITIN: Ferritin: 25 ng/mL (ref 11–307)

## 2023-11-17 NOTE — Progress Notes (Signed)
Provided patient with copy of labs. Patient does not meet parameters for phlebotomy today. HCT is 42.8%

## 2023-11-22 ENCOUNTER — Encounter: Payer: Self-pay | Admitting: Hematology

## 2023-11-25 ENCOUNTER — Ambulatory Visit: Payer: 59 | Admitting: Nurse Practitioner

## 2023-11-25 NOTE — Progress Notes (Unsigned)
Office Visit    Patient Name: [name] Date of Encounter: 11/25/2023  Primary Care Provider:  Arrie Senate, FNP Primary Cardiologist:  Nanetta Batty, MD  Chief Complaint    72 year old female with a history of nonobstructive CAD, paroxysmal atrial fibrillation, hypertension, hyperlipidemia, PAD s/p left external iliac and R SFA stenting, R BKA, known occlusion of left SFA, carotid artery stenosis, CVA, CKD stage IIIb, and polycythemia vera who presents for follow-up related to CAD and atrial fibrillation.  Past Medical History    Past Medical History:  Diagnosis Date   Acute blood loss anemia 05/04/2020   Arthritis    "fingers, some in my knees" (01/28/2016)   CAD (coronary artery disease), per cath 04/27/13, non obstructive disease, EF 65-70% 05/11/2013   Carotid disease, bilateral (HCC) 09/20/2013   Lt carotid 50-69% stentosis, less on rt   Cellulitis of left leg 01/16/2022   Dysrhythmia    Elevated troponin 05/04/2020   Encounter for support and coordination of transition of care 11/11/2021   Fatty liver    GERD (gastroesophageal reflux disease)    Glaucoma    H/O cardiovascular stress test 12/27/2009   negative for ischemia   Headache    "occasional since eye pressure regulated" (01/28/2016)   Hospital discharge follow-up 10/15/2021   Hyperlipidemia    Hypertension    Hypertensive emergency 11/28/2022   Leukocytosis 07/15/2015   Migraine    "stopped w/laser holes to relieve pressure in my eyes; had them 3-4 times/wk before taht" (01/28/2016)   Mitral regurgitation,2+ by cath 05/11/2013   NSTEMI (non-ST elevated myocardial infarction) (HCC) 04/27/2013   Pain    BOTH KNEES - PT HAS TORN MENISCUS LEFT KNEE   Pain in both wrists 09/03/2021   Pain in left knee 10/03/2018   Paroxysmal atrial fibrillation (HCC)    hx of a fib on ASA  AND ELIQUIS    Polycythemia vera (HCC) 08/20/2015   JAK-2 positive 07/19/15   Prosthesis adjustment 01/13/2019   PVD  (peripheral vascular disease) with claudication (HCC) 07/21/2006   previous L ext iliac stent and rt SFA stent, known occluded Lt SFA   S/P cardiac cath 04/27/2013   NON OBSTRUCTIVE DISEASE, 2+ mr, ef 65-70%   Statin intolerance    Stroke (HCC) 12/21/2004   SWELLING ALL OVER AND RT SIDE OF FACE DRAWN AND SPEECH SLURRED AND NUMBNESS ON RIGHT SIDE-- ALL RESOLVED   TIA (transient ischemic attack)    Past Surgical History:  Procedure Laterality Date   AMPUTATION Right 05/21/2017   Procedure: AMPUTATION ABOVE KNEE;  Surgeon: Larina Earthly, MD;  Location: [facility] OR;  Service: Vascular;  Laterality: Right;   CARDIAC CATHETERIZATION  04/27/13   non occlusive disease with mild to mod. calcified lesions in ostial LM and proximal LAD and moderate prox. RC AND DISTAL AV GROOVE LCX, ef   CATARACT EXTRACTION W/PHACO Right 03/21/2015   Procedure: CATARACT EXTRACTION PHACO AND INTRAOCULAR LENS PLACEMENT RIGHT EYE;  Surgeon: Fabio Pierce, MD;  Location: [facility] ORS;  Service: Ophthalmology;  Laterality: Right;  CDE:5.10   CATARACT EXTRACTION W/PHACO Left 05/16/2015   Procedure: CATARACT EXTRACTION PHACO AND INTRAOCULAR LENS PLACEMENT (IOC);  Surgeon: Fabio Pierce, MD;  Location: [facility] ORS;  Service: Ophthalmology;  Laterality: Left;  CDE 5.57   CESAREAN SECTION  1977   COLONOSCOPY N/A 09/11/2016   Procedure: COLONOSCOPY;  Surgeon: Malissa Hippo, MD;  Location: [facility] END[facility] SUITE;  Service: Endoscopy;  Laterality: N/A;  730-moved to 1:00 Ann notified pt  EMBOLECTOMY Right 02/01/2016   Procedure: Thrombectomy of Right Femoral-Popliteal Bypass Graft; Endarterectomy of Right Below Knee Popliteal Artery and Tibial Peroneal Trunk with Bovine Pericardium Patch Angioplasty.;  Surgeon: Chuck Hint, MD;  Location: [facility] OR;  Service: Vascular;  Laterality: Right;   FEMORAL-POPLITEAL BYPASS GRAFT Right 01/31/2016   Procedure: BYPASS GRAFT RIGHT COMMON  FEMORALTO BELOW KNEE POPLITEAL ARTERY BYPASS GRAFT USING PROPATEN GORTEX  GRAFT;  Surgeon: Pryor Ochoa, MD;  Location: [facility] OR;  Service: Vascular;  Laterality: Right;   FEMORAL-POPLITEAL BYPASS GRAFT Right 01/09/2017   Procedure: THROMBECTOMY OF RIGHT FEMORAL-POPLITEAL  ARTERY BYPASS GRAFT; THROMBECTOMY RIGHT  TIBIAL VESSELS;  Surgeon: Sherren Kerns, MD;  Location: [facility] OR;  Service: Vascular;  Laterality: Right;   FEMORAL-POPLITEAL BYPASS GRAFT Right 05/17/2017   Procedure: RIGHT FEMORAL-TIBIAL PERONEAL TRUNK ARTERY BYPASS GRAFT USING 73mmX80cm PROPATEN GRAFT WITH REMOVABLE RING;  Surgeon: Larina Earthly, MD;  Location: [facility] OR;  Service: Vascular;  Laterality: Right;   FRACTURE SURGERY     ILIAC ARTERY STENT  07/2006   external iliac and Rt SFA stent 07/2006 AND THE LEFT WAS IN 2006   KNEE ARTHROSCOPY WITH MEDIAL MENISECTOMY Left 03/27/2014   Procedure: left knee arthorscopy with medial chondraplasty of the medial femoral and patella, medial microfracture technique of medial femoral condyl;  Surgeon: Jacki Cones, MD;  Location: [facility] ORS;  Service: Orthopedics;  Laterality: Left;   LEFT HEART CATHETERIZATION WITH CORONARY ANGIOGRAM Right 04/27/2013   Procedure: LEFT HEART CATHETERIZATION WITH CORONARY ANGIOGRAM;  Surgeon: Marykay Lex, MD;  Location: [facility] CATH LAB;  Service: Cardiovascular;  Laterality: Right;   LOWER EXTREMITY ANGIOGRAM Right 01/31/2016   Procedure: INTRAOP RIGHT LOWER EXTREMITY ANGIOGRAM;  Surgeon: Pryor Ochoa, MD;  Location: [facility] OR;  Service: Vascular;  Laterality: Right;   ORIF WRIST FRACTURE Right 1983   OVARIAN CYST REMOVAL Right    PATCH ANGIOPLASTY Right 01/31/2016   Procedure: RIGHT COMMON FEMORAL AND PROFUNDA FEMORIS ENDARECTOMY WITH PATCH ANGIOPLASTY;  Surgeon: Pryor Ochoa, MD;  Location: [facility] OR;  Service: Vascular;  Laterality: Right;   PATCH ANGIOPLASTY Right 01/09/2017   Procedure: PATCH ANGIOPLASTY RIGHT POPLITEAL ARTERY BYPASS GRAFT;  Surgeon: Sherren Kerns, MD;  Location: [facility] OR;  Service: Vascular;  Laterality: Right;   PERIPHERAL  VASCULAR CATHETERIZATION N/A 06/27/2015   Procedure: Lower Extremity Angiography;  Surgeon: Runell Gess, MD;  Location: [facility] INVASIVE CV LAB;  Service: Cardiovascular;  Laterality: N/A;   PERIPHERAL VASCULAR CATHETERIZATION Bilateral 01/29/2016   Procedure: Lower Extremity Angiography;  Surgeon: Iran Ouch, MD;  Location: [facility] INVASIVE CV LAB;  Service: Cardiovascular;  Laterality: Bilateral;   PERIPHERAL VASCULAR CATHETERIZATION N/A 01/29/2016   Procedure: Abdominal Aortogram;  Surgeon: Iran Ouch, MD;  Location: [facility] INVASIVE CV LAB;  Service: Cardiovascular;  Laterality: N/A;   REFRACTIVE SURGERY Bilateral    "6 in one eye; 7 in the other; to relieve pressure; not glaucoma" (01/28/2016)   SFA Right 06/27/2015   overlapping Bahn covered stents   TUBAL LIGATION  1983   VEIN HARVEST Left 05/17/2017   Procedure: LEFT LEG GREATER SAPHENOUS VEIN HARVEST;  Surgeon: Larina Earthly, MD;  Location: [facility] OR;  Service: Vascular;  Laterality: Left;   VEIN REPAIR Right 01/31/2016   Procedure: RIGHT GREATER SAPHENOUS VEIN EXAMNED BUT NOT REMOVED;  Surgeon: Pryor Ochoa, MD;  Location: [facility] OR;  Service: Vascular;  Laterality: Right;    Allergies  Allergies  Allergen Reactions   Atenolol Rash and  Itching   Demerol  [Meperidine Hcl] Itching   Demerol [Meperidine] Other (See Comments) and Itching    Unknown, can't remember  Unknown, can't remember    Gabapentin Anxiety and Other (See Comments)    SI and HI   Inderal [Propranolol] Other (See Comments) and Itching    Doesn't remember    Prednisone Other (See Comments)    Muscle spasms   Statins Itching and Other (See Comments)    Simvastatin-caused severe itching Pravastatin-caused lesser tiching One type of statin? Caused stroke in 2006, and other symptoms as result   Warfarin And Related Other (See Comments)    Caused nose bleeds   Crestor [Rosuvastatin] Itching and Rash   Docosahexaenoic Acid (Dha) (Fish)     Other reaction(s): Other (See  Comments) nosebleeds   Lactose Intolerance (Gi) Other (See Comments)    Bloating, gas   Lipitor [Atorvastatin] Rash   Lovaza [Omega-3-Acid Ethyl Esters (Fish)] Other (See Comments)    nosebleeds    Penicillins Hives, Rash and Other (See Comments)    Has patient had a PCN reaction causing immediate rash, facial/tongue/throat swelling, SOB or lightheadedness with hypotension: Yes Has patient had a PCN reaction causing severe rash involving mucus membranes or skin necrosis: No Has patient had a PCN reaction that required hospitalization: No Has patient had a PCN reaction occurring within the last 10 years: No If all of the above answers are "NO", then may proceed with Cephalosporin use.  Questionable high fever    Pravastatin Itching and Rash   Simvastatin Itching, Rash and Other (See Comments)   Trilipix [Choline Fenofibrate] Other (See Comments)    Doesn't remember    Warfarin Other (See Comments)    nosebleeds   Hydralazine Swelling   Effexor [Venlafaxine Hcl] Other (See Comments)    Doesn't remember    Nexium [Esomeprazole Magnesium] Rash   Percocet [Oxycodone-Acetaminophen] Other (See Comments)    Doesn't remember    Plavix [Clopidogrel Bisulfate] Rash     Labs/Other Studies Reviewed    The following studies were reviewed today:  Cardiac Studies & Procedures       ECHOCARDIOGRAM  ECHOCARDIOGRAM COMPLETE 11/04/2021  Narrative ECHOCARDIOGRAM REPORT    Patient Name:   [name] Date of Exam: 11/04/2021 Medical Rec #:  [id number]      Height:       64.5 in Accession #:    [id number]     Weight:       137.3 lb Date of Birth:  Jan 19, 1951       BSA:          1.676 m Patient Age:    70 years       BP:           138/73 mmHg Patient Gender: F              HR:           64 bpm. [facility]  Procedure: 2D Echo, Cardiac Doppler and Color Doppler  Indications:    TIA  History:        Patient has prior history of Echocardiogram examinations,  most recent 10/06/2021. CAD and Previous Myocardial Infarction, PAD and TIA, Arrythmias:Atrial Fibrillation; Risk Factors:Hypertension and Dyslipidemia.  Sonographer:    Mikki Harbor Referring Phys: [id number] ASIA B ZIERLE-GHOSH  IMPRESSIONS   1. Left ventricular ejection fraction, by estimation, is 70 to 75%. The left ventricle has hyperdynamic function. The left ventricle has no regional wall motion  abnormalities. There is severe asymmetric left ventricular hypertrophy of the basal and septal segments. Left ventricular diastolic parameters are consistent with Grade I diastolic dysfunction (impaired relaxation). There is mitral chordal SAM with mild LVOT gradient as before. 2. Right ventricular systolic function is normal. The right ventricular size is normal. Tricuspid regurgitation signal is inadequate for assessing PA pressure. 3. Left atrial size was mildly dilated. 4. The mitral valve is abnormal. Trivial mitral valve regurgitation. Moderate mitral annular calcification. 5. The aortic valve is tricuspid. Aortic valve regurgitation is not visualized. Aortic valve sclerosis is present, with no evidence of aortic valve stenosis. Aortic valve mean gradient measures 6.0 mmHg. 6. The inferior vena cava is normal in size with greater than 50% respiratory variability, suggesting right atrial pressure of 3 mmHg.  Comparison(s): No significant change from prior study. Prior images reviewed side by side.  FINDINGS Left Ventricle: Left ventricular ejection fraction, by estimation, is 70 to 75%. The left ventricle has hyperdynamic function. The left ventricle has no regional wall motion abnormalities. The left ventricular internal cavity size was normal in size. There is severe asymmetric left ventricular hypertrophy of the basal and septal segments. Left ventricular diastolic parameters are consistent with Grade I diastolic dysfunction (impaired relaxation).  Right Ventricle: The right  ventricular size is normal. No increase in right ventricular wall thickness. Right ventricular systolic function is normal. Tricuspid regurgitation signal is inadequate for assessing PA pressure.  Left Atrium: Left atrial size was mildly dilated.  Right Atrium: Right atrial size was normal in size.  Pericardium: There is no evidence of pericardial effusion.  Mitral Valve: The mitral valve is abnormal. Moderate mitral annular calcification. Trivial mitral valve regurgitation. MV peak gradient, 7.5 mmHg. The mean mitral valve gradient is 2.0 mmHg.  Tricuspid Valve: The tricuspid valve is grossly normal. Tricuspid valve regurgitation is trivial.  Aortic Valve: The aortic valve is tricuspid. There is mild to moderate aortic valve annular calcification. Aortic valve regurgitation is not visualized. Aortic valve sclerosis is present, with no evidence of aortic valve stenosis. Aortic valve mean gradient measures 6.0 mmHg. Aortic valve peak gradient measures 10.9 mmHg. Aortic valve area, by VTI measures 2.43 cm.  Pulmonic Valve: The pulmonic valve was grossly normal. Pulmonic valve regurgitation is trivial.  Aorta: The aortic root is normal in size and structure.  Venous: The inferior vena cava is normal in size with greater than 50% respiratory variability, suggesting right atrial pressure of 3 mmHg.  IAS/Shunts: No atrial level shunt detected by color flow Doppler.   LEFT VENTRICLE PLAX 2D LVIDd:         3.90 cm     Diastology LVIDs:         2.60 cm     LV e' medial:    5.33 cm/s LV PW:         1.30 cm     LV E/e' medial:  16.6 LV IVS:        1.80 cm     LV e' lateral:   6.53 cm/s LVOT diam:     2.00 cm     LV E/e' lateral: 13.6 LV SV:         94 LV SV Index:   56 LVOT Area:     3.14 cm  LV Volumes (MOD) LV vol d, MOD A2C: 55.5 ml LV vol d, MOD A4C: 41.2 ml LV vol s, MOD A2C: 16.7 ml LV vol s, MOD A4C: 12.4 ml LV SV MOD A2C:  38.8 ml LV SV MOD A4C:     41.2 ml LV SV MOD BP:       34.2 ml  RIGHT VENTRICLE RV Basal diam:  2.65 cm RV Mid diam:    3.10 cm RV S prime:     12.00 cm/s TAPSE (M-mode): 2.5 cm  LEFT ATRIUM             Index        RIGHT ATRIUM           Index LA diam:        3.70 cm 2.21 cm/m   RA Area:     15.70 cm LA Vol (A2C):   68.7 ml 40.98 ml/m  RA Volume:   38.00 ml  22.67 ml/m LA Vol (A4C):   49.7 ml 29.65 ml/m LA Biplane Vol: 58.5 ml 34.89 ml/m AORTIC VALVE                     PULMONIC VALVE AV Area (Vmax):    2.63 cm      PV Vmax:       1.12 m/s AV Area (Vmean):   2.23 cm      PV Peak grad:  5.0 mmHg AV Area (VTI):     2.43 cm AV Vmax:           165.00 cm/s AV Vmean:          109.000 cm/s AV VTI:            0.387 m AV Peak Grad:      10.9 mmHg AV Mean Grad:      6.0 mmHg LVOT Vmax:         138.00 cm/s LVOT Vmean:        77.300 cm/s LVOT VTI:          0.299 m LVOT/AV VTI ratio: 0.77  AORTA Ao Root diam: 2.40 cm  MITRAL VALVE MV Area (PHT): 3.19 cm     SHUNTS MV Area VTI:   2.63 cm     Systemic VTI:  0.30 m MV Peak grad:  7.5 mmHg     Systemic Diam: 2.00 cm MV Mean grad:  2.0 mmHg MV Vmax:       1.37 m/s MV Vmean:      63.8 cm/s MV Decel Time: 238 msec MV E velocity: 88.60 cm/s MV A velocity: 132.00 cm/s MV E/A ratio:  0.67  Nona Dell MD Electronically signed by Nona Dell MD Signature Date/Time: 11/04/2021/1:55:40 PM    Final            Recent Labs: 11/28/2022: TSH 3.729 12/09/2022: Magnesium 1.3 06/23/2023: BNP 462.8 08/31/2023: ALT 20; BUN 36; Creatinine, Ser 1.57; Potassium 4.5; Sodium 136 11/17/2023: Hemoglobin 12.1; Platelet Count 406  Recent Lipid Panel    Component Value Date/Time   CHOL 162 10/09/2022 1146   TRIG 192 (H) 10/09/2022 1146   HDL 40 10/09/2022 1146   CHOLHDL 4.1 10/09/2022 1146   CHOLHDL 4.9 11/04/2021 0417   VLDL 39 11/04/2021 0417   LDLCALC 89 10/09/2022 1146    History of Present Illness    72 year old female with the above past medical history including  nonobstructive CAD, paroxysmal atrial fibrillation, hypertension, hyperlipidemia, PAD s/p left external iliac and R SFA stenting, R BKA, known occlusion of left SFA,  carotid artery stenosis, CVA, CKD stage IIIb, and polycythemia vera.  She has a history of PAD s/p left external iliac iliac artery PTA and stenting  as well as right SFA stenting in 20,007 by Dr. Allyson Sabal.  She has a known occluded left FSA.  She has had multiple interventions to her right SFA including Viabahn stenting for in-stent restenosis with documentation of acute stent occlusion requiring urgent right femoropopliteal bypass grafting and ultimate thrombectomy in 2018.  She ultimately underwent right BKA in 2018.  She ambulates with a prosthesis.  ABIs in 12/2021 revealed left ABI of 0.43 with moderately elevated velocities in both external iliac arteries.  She is pending repeat ABIs in 11/2023.  She has a history of bilateral carotid artery stenosis.  Carotid duplex in 11/2022 revealed 40 to 59% R ICA stenosis, greater than 50% plaque in the CCA, 40 to 59% LICA stenosis, greater than 50% plaque in the L CCA, greater than 50% ECA stenosis.  Follow-up study was recommended in 1 year.  Additionally, she has a history of nonobstructive CAD by cardiac catheterization in 2014.  She also has a history of paroxysmal atrial fibrillation on Eliquis and flecainide.  She was last seen in the office on 03/02/2023 and was stable from a cardiac standpoint.  She presents today for follow-up.  Since her last visit  CAD: Paroxysmal atrial fibrillation: Hypertension: Hyperlipidemia: PAD: Carotid artery stenosis: History of CVA: Disposition:    Home Medications    Current Outpatient Medications  Medication Sig Dispense Refill   acetaminophen (TYLENOL) 500 MG tablet Take 2 tablets (1,000 mg total) by mouth every 6 (six) hours. 30 tablet 0   ARTIFICIAL TEAR OP Place 1 drop into both eyes as needed (for dry eyes).     aspirin EC 325 MG tablet Take  325 mg by mouth daily.     diclofenac Sodium (VOLTAREN) 1 % GEL Apply 2 g topically 4 (four) times daily. (Patient taking differently: Apply 2 g topically 4 (four) times daily as needed (pain).) 50 g 0   diltiazem (CARDIZEM CD) 240 MG 24 hr capsule TAKE ONE CAPSULE DAILY 30 capsule 1   ELIQUIS 5 MG TABS tablet TAKE ONE TABLET TWICE DAILY 60 tablet 1   escitalopram (LEXAPRO) 20 MG tablet TAKE ONE TABLET ONCE DAILY. NEEDS TO BE SEEN FOR REFILLS 30 tablet 0   ezetimibe (ZETIA) 10 MG tablet TAKE ONE TABLET DAILY 30 tablet 1   fenofibrate (TRICOR) 145 MG tablet TAKE ONE TABLET DAILY 30 tablet 1   flecainide (TAMBOCOR) 50 MG tablet TAKE ONE TABLET TWICE DAILY 60 tablet 1   Multiple Vitamins-Minerals (CENTRUM SILVER ADULT 50+ PO) Take by mouth.     omeprazole (PRILOSEC) 20 MG capsule TAKE ONE CAPSULE TWICE DAILY BEFORE MEALS 180 capsule 0   triamcinolone cream (KENALOG) 0.1 % Apply 1 Application topically 2 (two) times daily. 30 g 0   valsartan (DIOVAN) 160 MG tablet TAKE ONE TABLET EVERY EVENING (WITH 320MG  IN THE MORNING) 90 tablet 3   valsartan (DIOVAN) 320 MG tablet TAKE ONE TABLET EVERY MORNING (WITH 160MG  EVERY EVENING) 90 tablet 3   No current facility-administered medications for this visit.     Review of Systems    ***.  All other systems reviewed and are otherwise negative except as noted above.    Physical Exam    VS:  There were no vitals taken for this visit. , BMI There is no height or weight on file to calculate BMI.     GEN: Well nourished, well developed, in no acute distress. HEENT: normal. Neck: Supple, no JVD, carotid bruits, or masses. Cardiac: RRR, no murmurs, rubs, or  gallops. No clubbing, cyanosis, edema.  Radials/DP/PT 2+ and equal bilaterally.  Respiratory:  Respirations regular and unlabored, clear to auscultation bilaterally. GI: Soft, nontender, nondistended, BS + x 4. MS: no deformity or atrophy. Skin: warm and dry, no rash. Neuro:  Strength and sensation are  intact. Psych: Normal affect.  Accessory Clinical Findings    ECG personally reviewed by me today -    - no acute changes.   Lab Results  Component Value Date   WBC 31.8 (H) 11/17/2023   HGB 12.1 11/17/2023   HCT 42.8 11/17/2023   MCV 71.9 (L) 11/17/2023   PLT 406 (H) 11/17/2023   Lab Results  Component Value Date   CREATININE 1.57 (H) 08/31/2023   BUN 36 (H) 08/31/2023   NA 136 08/31/2023   K 4.5 08/31/2023   CL 109 08/31/2023   CO2 17 (L) 08/31/2023   Lab Results  Component Value Date   ALT 20 08/31/2023   AST 28 08/31/2023   ALKPHOS 79 08/31/2023   BILITOT 0.5 08/31/2023   Lab Results  Component Value Date   CHOL 162 10/09/2022   HDL 40 10/09/2022   LDLCALC 89 10/09/2022   TRIG 192 (H) 10/09/2022   CHOLHDL 4.1 10/09/2022    Lab Results  Component Value Date   HGBA1C 4.9 11/04/2021    Assessment & Plan    1.  ***  No BP recorded.  {Refresh Note OR Click here to enter BP  :1}***   Joylene Grapes, NP 11/25/2023, 5:34 AM

## 2023-11-30 ENCOUNTER — Telehealth: Payer: Self-pay

## 2023-11-30 NOTE — Patient Outreach (Signed)
Successful call to patient on today regarding preventative mammogram screening. Patient declined at this time and states PCP aware as she has declined screening for years.  Baruch Gouty Andrews/VBCI  [facility] Assistant-Population Health [phone]

## 2023-12-01 ENCOUNTER — Ambulatory Visit: Payer: 59

## 2023-12-01 VITALS — Ht 64.0 in | Wt 122.0 lb

## 2023-12-01 DIAGNOSIS — Z0000 Encounter for general adult medical examination without abnormal findings: Secondary | ICD-10-CM

## 2023-12-01 NOTE — Progress Notes (Signed)
Subjective:   [name] is a 72 y.o. female who presents for Medicare Annual (Subsequent) preventive examination.  Visit Complete: Virtual I connected with  Lynore B Marchio on 12/01/23 by a audio enabled telemedicine application and verified that I am speaking with the correct person using two identifiers.  Patient Location: Home  Provider Location: Home Office  I discussed the limitations of evaluation and management by telemedicine. The patient expressed understanding and agreed to proceed.  Vital Signs: Because this visit was a virtual/telehealth visit, some criteria may be missing or patient reported. Any vitals not documented were not able to be obtained and vitals that have been documented are patient reported.  Cardiac Risk Factors include: advanced age (>28men, >66 women);dyslipidemia;hypertension;sedentary lifestyle     Objective:    Today's Vitals   12/01/23 1050  Weight: 122 lb (55.3 kg)  Height: 5\' 4"  (1.626 m)   Body mass index is 20.94 kg/m.     12/01/2023   10:55 AM 08/31/2023    7:21 PM 06/29/2023    7:58 PM 11/28/2022    2:00 PM 11/27/2022    8:48 PM 11/27/2022    2:11 PM 08/26/2022   11:08 AM  Advanced Directives  Does Patient Have a Medical Advance Directive? No No No No No No No  Would patient like information on creating a medical advance directive? Yes (MAU/Ambulatory/Procedural Areas - Information given) No - Patient declined  No - Patient declined  No - Patient declined     Current Medications (verified) Outpatient Encounter Medications as of 12/01/2023  Medication Sig   acetaminophen (TYLENOL) 500 MG tablet Take 2 tablets (1,000 mg total) by mouth every 6 (six) hours.   ARTIFICIAL TEAR OP Place 1 drop into both eyes as needed (for dry eyes).   aspirin EC 325 MG tablet Take 325 mg by mouth daily.   BELBUCA 75 MCG FILM Take by mouth.   diclofenac Sodium (VOLTAREN) 1 % GEL Apply 2 g topically 4 (four) times daily. (Patient taking differently: Apply  2 g topically 4 (four) times daily as needed (pain).)   diltiazem (CARDIZEM CD) 240 MG 24 hr capsule TAKE ONE CAPSULE DAILY   ELIQUIS 5 MG TABS tablet TAKE ONE TABLET TWICE DAILY   escitalopram (LEXAPRO) 20 MG tablet TAKE ONE TABLET ONCE DAILY. NEEDS TO BE SEEN FOR REFILLS   ezetimibe (ZETIA) 10 MG tablet TAKE ONE TABLET DAILY   fenofibrate (TRICOR) 145 MG tablet TAKE ONE TABLET DAILY   flecainide (TAMBOCOR) 50 MG tablet TAKE ONE TABLET TWICE DAILY   Multiple Vitamins-Minerals (CENTRUM SILVER ADULT 50+ PO) Take by mouth.   omeprazole (PRILOSEC) 20 MG capsule TAKE ONE CAPSULE TWICE DAILY BEFORE MEALS   triamcinolone cream (KENALOG) 0.1 % Apply 1 Application topically 2 (two) times daily.   valsartan (DIOVAN) 160 MG tablet TAKE ONE TABLET EVERY EVENING (WITH 320MG  IN THE MORNING)   valsartan (DIOVAN) 320 MG tablet TAKE ONE TABLET EVERY MORNING (WITH 160MG  EVERY EVENING)   No facility-administered encounter medications on file as of 12/01/2023.    Allergies (verified) Atenolol, Demerol  [meperidine hcl], Demerol [meperidine], Gabapentin, Inderal [propranolol], Prednisone, Statins, Warfarin and related, Crestor [rosuvastatin], Docosahexaenoic acid (dha) (fish), Lactose intolerance (gi), Lipitor [atorvastatin], Lovaza [omega-3-acid ethyl esters (fish)], Penicillins, Pravastatin, Simvastatin, Trilipix [choline fenofibrate], Warfarin, Hydralazine, Effexor [venlafaxine hcl], Nexium [esomeprazole magnesium], Percocet [oxycodone-acetaminophen], and Plavix [clopidogrel bisulfate]   History: Past Medical History:  Diagnosis Date   Acute blood loss anemia 05/04/2020   Arthritis    "  fingers, some in my knees" (01/28/2016)   CAD (coronary artery disease), per cath 04/27/13, non obstructive disease, EF 65-70% 05/11/2013   Carotid disease, bilateral (HCC) 09/20/2013   Lt carotid 50-69% stentosis, less on rt   Cellulitis of left leg 01/16/2022   Dysrhythmia    Elevated troponin 05/04/2020   Encounter for  support and coordination of transition of care 11/11/2021   Fatty liver    GERD (gastroesophageal reflux disease)    Glaucoma    H/O cardiovascular stress test 12/27/2009   negative for ischemia   Headache    "occasional since eye pressure regulated" (01/28/2016)   Hospital discharge follow-up 10/15/2021   Hyperlipidemia    Hypertension    Hypertensive emergency 11/28/2022   Leukocytosis 07/15/2015   Migraine    "stopped w/laser holes to relieve pressure in my eyes; had them 3-4 times/wk before taht" (01/28/2016)   Mitral regurgitation,2+ by cath 05/11/2013   NSTEMI (non-ST elevated myocardial infarction) (HCC) 04/27/2013   Pain    BOTH KNEES - PT HAS TORN MENISCUS LEFT KNEE   Pain in both wrists 09/03/2021   Pain in left knee 10/03/2018   Paroxysmal atrial fibrillation (HCC)    hx of a fib on ASA  AND ELIQUIS    Polycythemia vera (HCC) 08/20/2015   JAK-2 positive 07/19/15   Prosthesis adjustment 01/13/2019   PVD (peripheral vascular disease) with claudication (HCC) 07/21/2006   previous L ext iliac stent and rt SFA stent, known occluded Lt SFA   S/P cardiac cath 04/27/2013   NON OBSTRUCTIVE DISEASE, 2+ mr, ef 65-70%   Statin intolerance    Stroke (HCC) 12/21/2004   SWELLING ALL OVER AND RT SIDE OF FACE DRAWN AND SPEECH SLURRED AND NUMBNESS ON RIGHT SIDE-- ALL RESOLVED   TIA (transient ischemic attack)    Past Surgical History:  Procedure Laterality Date   AMPUTATION Right 05/21/2017   Procedure: AMPUTATION ABOVE KNEE;  Surgeon: Larina Earthly, MD;  Location: [facility] OR;  Service: Vascular;  Laterality: Right;   CARDIAC CATHETERIZATION  04/27/13   non occlusive disease with mild to mod. calcified lesions in ostial LM and proximal LAD and moderate prox. RC AND DISTAL AV GROOVE LCX, ef   CATARACT EXTRACTION W/PHACO Right 03/21/2015   Procedure: CATARACT EXTRACTION PHACO AND INTRAOCULAR LENS PLACEMENT RIGHT EYE;  Surgeon: Fabio Pierce, MD;  Location: [facility] ORS;  Service: Ophthalmology;   Laterality: Right;  CDE:5.10   CATARACT EXTRACTION W/PHACO Left 05/16/2015   Procedure: CATARACT EXTRACTION PHACO AND INTRAOCULAR LENS PLACEMENT (IOC);  Surgeon: Fabio Pierce, MD;  Location: [facility] ORS;  Service: Ophthalmology;  Laterality: Left;  CDE 5.57   CESAREAN SECTION  1977   COLONOSCOPY N/A 09/11/2016   Procedure: COLONOSCOPY;  Surgeon: Malissa Hippo, MD;  Location: [facility] SUITE;  Service: Endoscopy;  Laterality: N/A;  730-moved to 1:00 Ann notified pt   EMBOLECTOMY Right 02/01/2016   Procedure: Thrombectomy of Right Femoral-Popliteal Bypass Graft; Endarterectomy of Right Below Knee Popliteal Artery and Tibial Peroneal Trunk with Bovine Pericardium Patch Angioplasty.;  Surgeon: Chuck Hint, MD;  Location: [facility] OR;  Service: Vascular;  Laterality: Right;   FEMORAL-POPLITEAL BYPASS GRAFT Right 01/31/2016   Procedure: BYPASS GRAFT RIGHT COMMON  FEMORALTO BELOW KNEE POPLITEAL ARTERY BYPASS GRAFT USING PROPATEN GORTEX GRAFT;  Surgeon: Pryor Ochoa, MD;  Location: [facility] OR;  Service: Vascular;  Laterality: Right;   FEMORAL-POPLITEAL BYPASS GRAFT Right 01/09/2017   Procedure: THROMBECTOMY OF RIGHT FEMORAL-POPLITEAL  ARTERY BYPASS GRAFT; THROMBECTOMY RIGHT  TIBIAL VESSELS;  Surgeon: Sherren Kerns, MD;  Location: [facility] OR;  Service: Vascular;  Laterality: Right;   FEMORAL-POPLITEAL BYPASS GRAFT Right 05/17/2017   Procedure: RIGHT FEMORAL-TIBIAL PERONEAL TRUNK ARTERY BYPASS GRAFT USING 4mmX80cm PROPATEN GRAFT WITH REMOVABLE RING;  Surgeon: Larina Earthly, MD;  Location: [facility] OR;  Service: Vascular;  Laterality: Right;   FRACTURE SURGERY     ILIAC ARTERY STENT  07/2006   external iliac and Rt SFA stent 07/2006 AND THE LEFT WAS IN 2006   KNEE ARTHROSCOPY WITH MEDIAL MENISECTOMY Left 03/27/2014   Procedure: left knee arthorscopy with medial chondraplasty of the medial femoral and patella, medial microfracture technique of medial femoral condyl;  Surgeon: Jacki Cones, MD;  Location: [facility] ORS;  Service:  Orthopedics;  Laterality: Left;   LEFT HEART CATHETERIZATION WITH CORONARY ANGIOGRAM Right 04/27/2013   Procedure: LEFT HEART CATHETERIZATION WITH CORONARY ANGIOGRAM;  Surgeon: Marykay Lex, MD;  Location: [facility] CATH LAB;  Service: Cardiovascular;  Laterality: Right;   LOWER EXTREMITY ANGIOGRAM Right 01/31/2016   Procedure: INTRAOP RIGHT LOWER EXTREMITY ANGIOGRAM;  Surgeon: Pryor Ochoa, MD;  Location: [facility] OR;  Service: Vascular;  Laterality: Right;   ORIF WRIST FRACTURE Right 1983   OVARIAN CYST REMOVAL Right    PATCH ANGIOPLASTY Right 01/31/2016   Procedure: RIGHT COMMON FEMORAL AND PROFUNDA FEMORIS ENDARECTOMY WITH PATCH ANGIOPLASTY;  Surgeon: Pryor Ochoa, MD;  Location: [facility] OR;  Service: Vascular;  Laterality: Right;   PATCH ANGIOPLASTY Right 01/09/2017   Procedure: PATCH ANGIOPLASTY RIGHT POPLITEAL ARTERY BYPASS GRAFT;  Surgeon: Sherren Kerns, MD;  Location: [facility] OR;  Service: Vascular;  Laterality: Right;   PERIPHERAL VASCULAR CATHETERIZATION N/A 06/27/2015   Procedure: Lower Extremity Angiography;  Surgeon: Runell Gess, MD;  Location: [facility] INVASIVE CV LAB;  Service: Cardiovascular;  Laterality: N/A;   PERIPHERAL VASCULAR CATHETERIZATION Bilateral 01/29/2016   Procedure: Lower Extremity Angiography;  Surgeon: Iran Ouch, MD;  Location: [facility] INVASIVE CV LAB;  Service: Cardiovascular;  Laterality: Bilateral;   PERIPHERAL VASCULAR CATHETERIZATION N/A 01/29/2016   Procedure: Abdominal Aortogram;  Surgeon: Iran Ouch, MD;  Location: [facility] INVASIVE CV LAB;  Service: Cardiovascular;  Laterality: N/A;   REFRACTIVE SURGERY Bilateral    "6 in one eye; 7 in the other; to relieve pressure; not glaucoma" (01/28/2016)   SFA Right 06/27/2015   overlapping Bahn covered stents   TUBAL LIGATION  1983   VEIN HARVEST Left 05/17/2017   Procedure: LEFT LEG GREATER SAPHENOUS VEIN HARVEST;  Surgeon: Larina Earthly, MD;  Location: [facility] OR;  Service: Vascular;  Laterality: Left;   VEIN REPAIR Right 01/31/2016    Procedure: RIGHT GREATER SAPHENOUS VEIN EXAMNED BUT NOT REMOVED;  Surgeon: Pryor Ochoa, MD;  Location: [facility] OR;  Service: Vascular;  Laterality: Right;   Family History  Problem Relation Age of Onset   CAD Mother    Lung cancer Mother    Heart attack Mother    CAD Father    Heart disease Father    Heart attack Father 78       died in hes 56's with heart disease   Healthy Sister    Liver cancer Brother    Healthy Sister    CAD Brother        has had CABG   CAD Brother    Social History   Socioeconomic History   Marital status: Widowed    Spouse name: Not on file   Number of children: 2   Years  of education: 12   Highest education level: GED or equivalent  Occupational History   Occupation: CNA  Tobacco Use   Smoking status: Former    Current packs/day: 0.00    Average packs/day: 1 pack/day for 45.0 years (45.0 ttl pk-yrs)    Types: Cigarettes    Start date: 11/12/1967    Quit date: 11/11/2012    Years since quitting: 11.0   Smokeless tobacco: Never   Tobacco comments:    quit 4 years ago.    10/27/21-Pt declines lung ca screen at this time.   Vaping Use   Vaping status: Never Used  Substance and Sexual Activity   Alcohol use: No    Alcohol/week: 0.0 standard drinks of alcohol   Drug use: No   Sexual activity: Not Currently    Birth control/protection: None  Other Topics Concern   Not on file  Social History Narrative   Husband passed away after CVA in 12-28-05.   2 children, 5 grandchildren and 1 great grandchild.   Social Determinants of Health   Financial Resource Strain: Low Risk  (12/01/2023)   Overall Financial Resource Strain (CARDIA)    Difficulty of Paying Living Expenses: Not hard at all  Food Insecurity: No Food Insecurity (12/01/2023)   Hunger Vital Sign    Worried About Running Out of Food in the Last Year: Never true    Ran Out of Food in the Last Year: Never true  Transportation Needs: No Transportation Needs (12/01/2023)   PRAPARE -  Administrator, Civil Service (Medical): No    Lack of Transportation (Non-Medical): No  Physical Activity: Inactive (12/01/2023)   Exercise Vital Sign    Days of Exercise per Week: 0 days    Minutes of Exercise per Session: 0 min  Stress: No Stress Concern Present (12/01/2023)   [employer] of Occupational Health - Occupational Stress Questionnaire    Feeling of Stress : Not at all  Social Connections: Moderately Isolated (12/01/2023)   Social Connection and Isolation Panel [NHANES]    Frequency of Communication with Friends and Family: More than three times a week    Frequency of Social Gatherings with Friends and Family: Three times a week    Attends Religious Services: 1 to 4 times per year    Active Member of Clubs or Organizations: No    Attends Banker Meetings: Never    Marital Status: Widowed    Tobacco Counseling Counseling given: Not Answered Tobacco comments: quit 4 years ago. 10/27/21-Pt declines lung ca screen at this time.    Clinical Intake:  Pre-visit preparation completed: Yes  Pain : No/denies pain     Diabetes: No  How often do you need to have someone help you when you read instructions, pamphlets, or other written materials from your doctor or pharmacy?: 1 - Never  Interpreter Needed?: No  Information entered by :: Kandis Fantasia LPN   Activities of Daily Living    12/01/2023   10:53 AM  In your present state of health, do you have any difficulty performing the following activities:  Hearing? 0  Vision? 0  Difficulty concentrating or making decisions? 0  Walking or climbing stairs? 1  Dressing or bathing? 0  Doing errands, shopping? 1  Preparing Food and eating ? N  Using the Toilet? N  In the past six months, have you accidently leaked urine? N  Do you have problems with loss of bowel control? N  Managing your Medications? N  Managing your Finances? N  Housekeeping or managing your Housekeeping? N     Patient Care Team: Milian, Aleen Campi, FNP as PCP - General (Family Medicine) Runell Gess, MD as PCP - Cardiology (Cardiology) Levert Feinstein, MD as Consulting Physician (Oncology) Sherren Kerns, MD (Inactive) as Consulting Physician (Vascular Surgery) Malachy Mood, MD as Attending Physician (Hematology and Oncology) Randa Lynn, MD as Consulting Physician (Nephrology)  Indicate any recent Medical Services you may have received from other than [facility] providers in the past year (date may be approximate).     Assessment:   This is a routine wellness examination for Tenee.  Hearing/Vision screen Hearing Screening - Comments:: Denies hearing difficulties   Vision Screening - Comments:: up to date with routine eye exams with [facility]     Goals Addressed             This Visit's Progress    Remain active and independent        Depression Screen    12/01/2023   10:52 AM 09/14/2023   10:47 AM 06/23/2023   11:17 AM 02/16/2023   10:14 AM 02/09/2023    2:09 PM 12/25/2022   12:02 PM 11/27/2022    2:10 PM  PHQ 2/9 Scores  PHQ - 2 Score 0 0 0 0 0 0 0  PHQ- 9 Score  0 0 0  0     Fall Risk    12/01/2023   10:53 AM 09/14/2023   10:48 AM 06/23/2023   11:17 AM 02/16/2023   10:17 AM 12/25/2022   12:01 PM  Fall Risk   Falls in the past year? 1 0  0 0  Number falls in past yr: 1 0 0 0 0  Injury with Fall? 1 0 0 0 0  Risk for fall due to : History of fall(s);Impaired balance/gait;Impaired mobility No Fall Risks No Fall Risks No Fall Risks No Fall Risks  Follow up Education provided;Falls prevention discussed;Falls evaluation completed Falls evaluation completed Falls evaluation completed Falls evaluation completed Falls evaluation completed    MEDICARE RISK AT HOME: Medicare Risk at Home Any stairs in or around the home?: No If so, are there any without handrails?: No Home free of loose throw rugs in walkways, pet beds, electrical cords, etc?:  Yes Adequate lighting in your home to reduce risk of falls?: Yes Life alert?: No Use of a cane, walker or w/c?: Yes Grab bars in the bathroom?: Yes Shower chair or bench in shower?: Yes Elevated toilet seat or a handicapped toilet?: Yes  TIMED UP AND GO:  Was the test performed?  No    Cognitive Function:        12/01/2023   10:54 AM 11/27/2022    2:12 PM 10/27/2021    1:32 PM  6CIT Screen  What Year? 0 points 0 points 0 points  What month? 0 points 0 points 0 points  What time? 0 points 0 points 0 points  Count back from 20 0 points 0 points 0 points  Months in reverse 0 points 0 points 0 points  Repeat phrase 0 points 0 points 4 points  Total Score 0 points 0 points 4 points    Immunizations Immunization History  Administered Date(s) Administered   Influenza-Unspecified 11/02/1995   Tdap 09/09/2018    TDAP status: Up to date  Flu Vaccine status: Declined, Education has been provided regarding the importance of this vaccine but patient still declined. Advised may receive this vaccine at local  pharmacy or Health Dept. Aware to provide a copy of the vaccination record if obtained from local pharmacy or Health Dept. Verbalized acceptance and understanding.  Pneumococcal vaccine status: Declined,  Education has been provided regarding the importance of this vaccine but patient still declined. Advised may receive this vaccine at local pharmacy or Health Dept. Aware to provide a copy of the vaccination record if obtained from local pharmacy or Health Dept. Verbalized acceptance and understanding.   Covid-19 vaccine status: Declined, Education has been provided regarding the importance of this vaccine but patient still declined. Advised may receive this vaccine at local pharmacy or Health Dept.or vaccine clinic. Aware to provide a copy of the vaccination record if obtained from local pharmacy or Health Dept. Verbalized acceptance and understanding.  Qualifies for Shingles  Vaccine? Yes   Zostavax completed No   Shingrix Completed?: No.    Education has been provided regarding the importance of this vaccine. Patient has been advised to call insurance company to determine out of pocket expense if they have not yet received this vaccine. Advised may also receive vaccine at local pharmacy or Health Dept. Verbalized acceptance and understanding.  Screening Tests Health Maintenance  Topic Date Due   COVID-19 Vaccine (1) Never done   Zoster Vaccines- Shingrix (1 of 2) Never done   Lung Cancer Screening  12/26/2023 (Originally 09/10/2019)   Pneumonia Vaccine 7+ Years old (1 of 2 - PCV) 12/26/2023 (Originally 08/25/1957)   MAMMOGRAM  12/26/2023 (Originally 08/25/2001)   Hepatitis C Screening  12/26/2023 (Originally [birth year]/1969)   INFLUENZA VACCINE  03/20/2024 (Originally 07/22/2023)   Medicare Annual Wellness (AWV)  11/30/2024   DEXA SCAN  06/22/2025   Colonoscopy  09/11/2026   DTaP/Tdap/Td (2 - Td or Tdap) 09/09/2028   HPV VACCINES  Aged Out    Health Maintenance  Health Maintenance Due  Topic Date Due   COVID-19 Vaccine (1) Never done   Zoster Vaccines- Shingrix (1 of 2) Never done    Colorectal cancer screening: Type of screening: Colonoscopy. Completed 09/11/16. Repeat every 10 years  Mammogram status:  Patient declines at this time  Bone Density status: Completed 06/23/23. Results reflect: Bone density results: OSTEOPENIA. Repeat every 2 years.  Lung Cancer Screening: (Low Dose CT Chest recommended if Age 56-80 years, 20 pack-year currently smoking OR have quit w/in 15years.) does not qualify.   Lung Cancer Screening Referral: n/a  Additional Screening:  Hepatitis C Screening: does qualify  Vision Screening: Recommended annual ophthalmology exams for early detection of glaucoma and other disorders of the eye. Is the patient up to date with their annual eye exam?  Yes  Who is the provider or what is the name of the office in which the patient attends annual  eye exams? [facility]  If pt is not established with a provider, would they like to be referred to a provider to establish care? No .   Dental Screening: Recommended annual dental exams for proper oral hygiene  Community Resource Referral / Chronic Care Management: CRR required this visit?  No   CCM required this visit?  No     Plan:     I have personally reviewed and noted the following in the patient's chart:   Medical and social history Use of alcohol, tobacco or illicit drugs  Current medications and supplements including opioid prescriptions. Patient is not currently taking opioid prescriptions. Functional ability and status Nutritional status Physical activity Advanced directives List of other physicians Hospitalizations, surgeries, and ER visits in previous  12 months Vitals Screenings to include cognitive, depression, and falls Referrals and appointments  In addition, I have reviewed and discussed with patient certain preventive protocols, quality metrics, and best practice recommendations. A written personalized care plan for preventive services as well as general preventive health recommendations were provided to patient.     Kandis Fantasia [place], [place]   [id number]   After Visit Summary: (MyChart) Due to this being a telephonic visit, the after visit summary with patients personalized plan was offered to patient via MyChart   Nurse Notes: No concerns at this time

## 2023-12-01 NOTE — Patient Instructions (Signed)
Ms. Girardeau , Thank you for taking time to come for your Medicare Wellness Visit. I appreciate your ongoing commitment to your health goals. Please review the following plan we discussed and let me know if I can assist you in the future.   Referrals/Orders/Follow-Ups/Clinician Recommendations: Aim for 30 minutes of exercise or brisk walking, 6-8 glasses of water, and 5 servings of fruits and vegetables each day.  This is a list of the screening recommended for you and due dates:  Health Maintenance  Topic Date Due   COVID-19 Vaccine (1) Never done   Zoster (Shingles) Vaccine (1 of 2) Never done   Screening for Lung Cancer  12/26/2023*   Pneumonia Vaccine (1 of 2 - PCV) 12/26/2023*   Mammogram  12/26/2023*   Hepatitis C Screening  12/26/2023*   Flu Shot  03/20/2024*   Medicare Annual Wellness Visit  11/30/2024   DEXA scan (bone density measurement)  06/22/2025   Colon Cancer Screening  09/11/2026   DTaP/Tdap/Td vaccine (2 - Td or Tdap) 09/09/2028   HPV Vaccine  Aged Out  *Topic was postponed. The date shown is not the original due date.    Advanced directives: (ACP Link)Information on Advanced Care Planning can be found at [facility] Advance Health Care Directives Advance Health Care Directives ([email or web address]   Next Medicare Annual Wellness Visit scheduled for next year:  Yes

## 2023-12-06 ENCOUNTER — Other Ambulatory Visit: Payer: Self-pay | Admitting: Cardiovascular Disease

## 2023-12-13 ENCOUNTER — Ambulatory Visit (HOSPITAL_BASED_OUTPATIENT_CLINIC_OR_DEPARTMENT_OTHER)
Admission: RE | Admit: 2023-12-13 | Discharge: 2023-12-13 | Disposition: A | Discharge status: Home / Self Care | Payer: 59 | Source: Ambulatory Visit | Attending: Cardiovascular Disease | Admitting: Cardiovascular Disease

## 2023-12-13 ENCOUNTER — Ambulatory Visit (HOSPITAL_COMMUNITY)
Admission: RE | Admit: 2023-12-13 | Discharge: 2023-12-13 | Disposition: A | Discharge status: Home / Self Care | Payer: 59 | Source: Ambulatory Visit | Attending: Cardiology | Admitting: Cardiology

## 2023-12-13 ENCOUNTER — Ambulatory Visit (HOSPITAL_BASED_OUTPATIENT_CLINIC_OR_DEPARTMENT_OTHER)
Admission: RE | Admit: 2023-12-13 | Discharge: 2023-12-13 | Disposition: A | Discharge status: Home / Self Care | Payer: 59 | Source: Ambulatory Visit | Attending: Cardiology | Admitting: Cardiology

## 2023-12-13 ENCOUNTER — Encounter: Payer: Self-pay | Admitting: Hematology

## 2023-12-13 DIAGNOSIS — I708 Atherosclerosis of other arteries: Secondary | ICD-10-CM

## 2023-12-13 DIAGNOSIS — I6522 Occlusion and stenosis of left carotid artery: Secondary | ICD-10-CM | POA: Diagnosis present

## 2023-12-13 DIAGNOSIS — I6529 Occlusion and stenosis of unspecified carotid artery: Secondary | ICD-10-CM | POA: Insufficient documentation

## 2023-12-13 DIAGNOSIS — I739 Peripheral vascular disease, unspecified: Secondary | ICD-10-CM

## 2023-12-13 DIAGNOSIS — M79605 Pain in left leg: Secondary | ICD-10-CM

## 2023-12-14 LAB — VAS US ABI WITH/WO TBI: Left ABI: 0.53

## 2023-12-16 ENCOUNTER — Ambulatory Visit: Payer: 59 | Admitting: Family Medicine

## 2023-12-21 ENCOUNTER — Ambulatory Visit: Payer: 59 | Admitting: Nurse Practitioner

## 2023-12-28 ENCOUNTER — Other Ambulatory Visit: Payer: Self-pay | Admitting: Family Medicine

## 2023-12-28 DIAGNOSIS — I10 Essential (primary) hypertension: Secondary | ICD-10-CM

## 2023-12-28 DIAGNOSIS — I482 Chronic atrial fibrillation, unspecified: Secondary | ICD-10-CM

## 2023-12-28 DIAGNOSIS — Z8673 Personal history of transient ischemic attack (TIA), and cerebral infarction without residual deficits: Secondary | ICD-10-CM

## 2023-12-28 DIAGNOSIS — K219 Gastro-esophageal reflux disease without esophagitis: Secondary | ICD-10-CM

## 2023-12-28 DIAGNOSIS — E785 Hyperlipidemia, unspecified: Secondary | ICD-10-CM

## 2024-01-03 ENCOUNTER — Encounter: Payer: Self-pay | Admitting: Hematology

## 2024-01-09 NOTE — Progress Notes (Unsigned)
Cardiology Office Note    Date:  01/12/2024  ID:  Adlie, Boehnlein 10/20/1951, MRN [id number] PCP:  Arrie Senate, FNP  Cardiologist:  Nanetta Batty, MD  Electrophysiologist:  None   Chief Complaint: Follow up   History of Present Illness: .    CHENICE DOMENICK is a 73 y.o. female with visit-pertinent history of essential hypertension, nonobstructive coronary artery disease, paroxysmal atrial fibrillation, polycythemia vera, PAD s/p left external iliac and right SFA stenting, right BKA, known occlusion of the left SFA, carotid artery stenosis, CVA, CKD stage IIIb.   Patient with a history of PAD s/p left external iliac artery PTA and stenting as well as right SFA stenting in 2007 by Dr. Gery Pray.  She has a known occluded left SFA.  She has had multiple interventions to right SFA including Viabahn stenting for in-stent restenosis with documentation of acute stent occlusion requiring urgent right femoral-popliteal bypass grafting and ultimate thrombectomy in 2018.  In 2018 she underwent right BKA.  She ambulates with a prosthesis.  ABIs in 11/2023 indicated resting right ABI within normal range, resting left ABI indicates moderate left lower extremity arterial disease, no significant change from prior study recommend follow-up study in 12 months. Carotid duplex in 11/2022 revealed 40 to 59% right ICA stenosis, greater than 50% plaque in the CCA, 40 to 59% like a stenosis, greater than 50% plaque in the L CCA, greater than 50% ECA stenosis.  Repeat carotid duplex on 12/13/2023 indicated right ICA with a 1 to 39% stenosis, left ICA consistent with a 1 to 39% stenosis, ECA appears greater than 50% stenosed, significant left subclavian artery stenosis.  Dr. Gery Pray recommended repeat in 12 months. She also has a history of nonobstructive CAD by cardiac catheterization in 2014.  She also has a history of paroxysmal atrial fibrillation on Eliquis and flecainide.  She was last seen in the office on  03/02/2023 was stable from a cardiac standpoint.  Today she presents for follow up. She reports that she is doing well.  She reports some occasional shortness of breath, not specifically associated with exertion.  She denies any chest pain, palpitations, orthopnea or PND.  She notes that she is now seeing a new primary care provider who wants to defer medication management to cardiology.  She reports she is otherwise doing well without any claudication or symptoms of her left subclavian stenosis.  She continues to walk using her prosthesis and a walker, notes she tolerates this well.  Labwork independently reviewed: 08/31/2023: Sodium 136, potassium 4.5, creatinine 1.57, AST 28, ALT 20 ROS: .   Today she denies chest pain, lower extremity edema, fatigue, palpitations, melena, hematuria, hemoptysis, diaphoresis, weakness, presyncope, syncope, orthopnea, and PND.  All other systems are reviewed and otherwise negative. Studies Reviewed: Marland Kitchen    EKG:  EKG is ordered today, personally reviewed, demonstrating  EKG Interpretation Date/Time:  Wednesday January 12 2024 13:13:46 EST Ventricular Rate:  74 PR Interval:  132 QRS Duration:  84 QT Interval:  390 QTC Calculation: 432 R Axis:   36  Text Interpretation: Normal sinus rhythm Septal infarct , age undetermined Septal T wave inversion Abnormal T wave size, similar to prior tracings Confirmed by Reather Littler [phone]) on 01/12/2024 5:46:26 PM   CV Studies:  Cardiac Studies & Procedures      ECHOCARDIOGRAM  ECHOCARDIOGRAM COMPLETE 11/04/2021  Narrative ECHOCARDIOGRAM REPORT    Patient Name:   NILAM EASTLING Date of Exam: 11/04/2021 Medical Rec #:  [id number]  Height:       64.5 in Accession #:    [id number]     Weight:       137.3 lb Date of Birth:  04/02/1951       BSA:          1.676 m Patient Age:    70 years       BP:           138/73 mmHg Patient Gender: F              HR:           64 bpm. Exam [facility]  Procedure: 2D  Echo, Cardiac Doppler and Color Doppler  Indications:    TIA  History:        Patient has prior history of Echocardiogram examinations, most recent 10/06/2021. CAD and Previous Myocardial Infarction, PAD and TIA, Arrythmias:Atrial Fibrillation; Risk Factors:Hypertension and Dyslipidemia.  Sonographer:    Mikki Harbor Referring Phys: [id number] ASIA B ZIERLE-GHOSH  IMPRESSIONS   1. Left ventricular ejection fraction, by estimation, is 70 to 75%. The left ventricle has hyperdynamic function. The left ventricle has no regional wall motion abnormalities. There is severe asymmetric left ventricular hypertrophy of the basal and septal segments. Left ventricular diastolic parameters are consistent with Grade I diastolic dysfunction (impaired relaxation). There is mitral chordal SAM with mild LVOT gradient as before. 2. Right ventricular systolic function is normal. The right ventricular size is normal. Tricuspid regurgitation signal is inadequate for assessing PA pressure. 3. Left atrial size was mildly dilated. 4. The mitral valve is abnormal. Trivial mitral valve regurgitation. Moderate mitral annular calcification. 5. The aortic valve is tricuspid. Aortic valve regurgitation is not visualized. Aortic valve sclerosis is present, with no evidence of aortic valve stenosis. Aortic valve mean gradient measures 6.0 mmHg. 6. The inferior vena cava is normal in size with greater than 50% respiratory variability, suggesting right atrial pressure of 3 mmHg.  Comparison(s): No significant change from prior study. Prior images reviewed side by side.  FINDINGS Left Ventricle: Left ventricular ejection fraction, by estimation, is 70 to 75%. The left ventricle has hyperdynamic function. The left ventricle has no regional wall motion abnormalities. The left ventricular internal cavity size was normal in size. There is severe asymmetric left ventricular hypertrophy of the basal and septal segments. Left  ventricular diastolic parameters are consistent with Grade I diastolic dysfunction (impaired relaxation).  Right Ventricle: The right ventricular size is normal. No increase in right ventricular wall thickness. Right ventricular systolic function is normal. Tricuspid regurgitation signal is inadequate for assessing PA pressure.  Left Atrium: Left atrial size was mildly dilated.  Right Atrium: Right atrial size was normal in size.  Pericardium: There is no evidence of pericardial effusion.  Mitral Valve: The mitral valve is abnormal. Moderate mitral annular calcification. Trivial mitral valve regurgitation. MV peak gradient, 7.5 mmHg. The mean mitral valve gradient is 2.0 mmHg.  Tricuspid Valve: The tricuspid valve is grossly normal. Tricuspid valve regurgitation is trivial.  Aortic Valve: The aortic valve is tricuspid. There is mild to moderate aortic valve annular calcification. Aortic valve regurgitation is not visualized. Aortic valve sclerosis is present, with no evidence of aortic valve stenosis. Aortic valve mean gradient measures 6.0 mmHg. Aortic valve peak gradient measures 10.9 mmHg. Aortic valve area, by VTI measures 2.43 cm.  Pulmonic Valve: The pulmonic valve was grossly normal. Pulmonic valve regurgitation is trivial.  Aorta: The aortic root is normal in size and structure.  Venous: The inferior vena cava is normal in size with greater than 50% respiratory variability, suggesting right atrial pressure of 3 mmHg.  IAS/Shunts: No atrial level shunt detected by color flow Doppler.   LEFT VENTRICLE PLAX 2D LVIDd:         3.90 cm     Diastology LVIDs:         2.60 cm     LV e' medial:    5.33 cm/s LV PW:         1.30 cm     LV E/e' medial:  16.6 LV IVS:        1.80 cm     LV e' lateral:   6.53 cm/s LVOT diam:     2.00 cm     LV E/e' lateral: 13.6 LV SV:         94 LV SV Index:   56 LVOT Area:     3.14 cm  LV Volumes (MOD) LV vol d, MOD A2C: 55.5 ml LV vol d, MOD A4C:  41.2 ml LV vol s, MOD A2C: 16.7 ml LV vol s, MOD A4C: 12.4 ml LV SV MOD A2C:     38.8 ml LV SV MOD A4C:     41.2 ml LV SV MOD BP:      34.2 ml  RIGHT VENTRICLE RV Basal diam:  2.65 cm RV Mid diam:    3.10 cm RV S prime:     12.00 cm/s TAPSE (M-mode): 2.5 cm  LEFT ATRIUM             Index        RIGHT ATRIUM           Index LA diam:        3.70 cm 2.21 cm/m   RA Area:     15.70 cm LA Vol (A2C):   68.7 ml 40.98 ml/m  RA Volume:   38.00 ml  22.67 ml/m LA Vol (A4C):   49.7 ml 29.65 ml/m LA Biplane Vol: 58.5 ml 34.89 ml/m AORTIC VALVE                     PULMONIC VALVE AV Area (Vmax):    2.63 cm      PV Vmax:       1.12 m/s AV Area (Vmean):   2.23 cm      PV Peak grad:  5.0 mmHg AV Area (VTI):     2.43 cm AV Vmax:           165.00 cm/s AV Vmean:          109.000 cm/s AV VTI:            0.387 m AV Peak Grad:      10.9 mmHg AV Mean Grad:      6.0 mmHg LVOT Vmax:         138.00 cm/s LVOT Vmean:        77.300 cm/s LVOT VTI:          0.299 m LVOT/AV VTI ratio: 0.77  AORTA Ao Root diam: 2.40 cm  MITRAL VALVE MV Area (PHT): 3.19 cm     SHUNTS MV Area VTI:   2.63 cm     Systemic VTI:  0.30 m MV Peak grad:  7.5 mmHg     Systemic Diam: 2.00 cm MV Mean grad:  2.0 mmHg MV Vmax:       1.37 m/s MV Vmean:      63.8  cm/s MV Decel Time: 238 msec MV E velocity: 88.60 cm/s MV A velocity: 132.00 cm/s MV E/A ratio:  0.67  Nona Dell MD Electronically signed by Nona Dell MD Signature Date/Time: 11/04/2021/1:55:40 PM    Final             Current Reported Medications:.    Current Meds  Medication Sig   acetaminophen (TYLENOL) 500 MG tablet Take 2 tablets (1,000 mg total) by mouth every 6 (six) hours.   apixaban (ELIQUIS) 5 MG TABS tablet Take 1 tablet (5 mg total) by mouth 2 (two) times daily. **NEEDS TO BE SEEN BEFORE NEXT REFILL**   ARTIFICIAL TEAR OP Place 1 drop into both eyes as needed (for dry eyes).   aspirin EC 325 MG tablet Take 325 mg by mouth daily.    diclofenac Sodium (VOLTAREN) 1 % GEL Apply 2 g topically 4 (four) times daily. (Patient taking differently: Apply 2 g topically 4 (four) times daily as needed (pain).)   diltiazem (CARDIZEM CD) 240 MG 24 hr capsule Take 1 capsule (240 mg total) by mouth daily. **NEEDS TO BE SEEN BEFORE NEXT REFILL**   escitalopram (LEXAPRO) 20 MG tablet TAKE ONE TABLET ONCE DAILY. NEEDS TO BE SEEN FOR REFILLS   ezetimibe (ZETIA) 10 MG tablet Take 1 tablet (10 mg total) by mouth daily. **NEEDS TO BE SEEN BEFORE NEXT REFILL**   fenofibrate (TRICOR) 145 MG tablet Take 1 tablet (145 mg total) by mouth daily. **NEEDS TO BE SEEN BEFORE NEXT REFILL**   flecainide (TAMBOCOR) 50 MG tablet Take 1 tablet (50 mg total) by mouth 2 (two) times daily. **NEEDS TO BE SEEN BEFORE NEXT REFILL**   omeprazole (PRILOSEC) 20 MG capsule TAKE ONE CAPSULE TWICE DAILY BEFORE MEALS **NEEDS TO BE SEEN BEFORE NEXT REFILL**   triamcinolone cream (KENALOG) 0.1 % Apply 1 Application topically 2 (two) times daily.   valsartan (DIOVAN) 160 MG tablet TAKE ONE TABLET EVERY EVENING (WITH 320MG  IN THE MORNING)   valsartan (DIOVAN) 320 MG tablet TAKE ONE TABLET EVERY MORNING (WITH 160MG  EVERY EVENING)    Physical Exam:    VS:  BP 138/74 (BP Location: Right Arm, Patient Position: Sitting, Cuff Size: Normal)   Pulse 71   Ht 5' 4.6" (1.641 m)   Wt 118 lb 12.8 oz (53.9 kg)   SpO2 95%   BMI 20.01 kg/m    Wt Readings from Last 3 Encounters:  01/12/24 118 lb 12.8 oz (53.9 kg)  12/01/23 122 lb (55.3 kg)  09/14/23 122 lb (55.3 kg)    GEN: Well nourished, well developed in no acute distress NECK: No JVD; No carotid bruits CARDIAC: RRR, 2/6 murmur, rubs, gallops RESPIRATORY:  Clear to auscultation without rales, wheezing or rhonchi  ABDOMEN: Soft, non-tender, non-distended EXTREMITIES:  RBKA with prosthesis; No acute deformity   Asessement and Plan:.    CAD/shortness of breath: Cardiac catheterization performed Dr. Herbie Baltimore in 04/2013 indicated  nonobstructive disease with 20 to 30% left main stenosis, LAD 30%, OM2 50 to 60%, RCA 40%, EF 65 to 70%. Today she denies any chest pain, notes some shortness of breath, not associated with exertion, see below. Will check echocardiogram.   Hypertrophic cardiomyopathy?/shortness of breath: Echocardiogram on 11/04/2021 showed LVEF 70 to 75%, LV with hyperdynamic function, LV with no RWMA, there was severe asymmetric LVH of the basal and septal segment.  There was mitral chordal SAM with mild LVOT gradient.  Patient today notes some increased shortness of breath that occurs out anytime, not specifically associate  with exertion.  She denies any increased edema, orthopnea or PND.  She denies any presyncope or syncope.  Reviewed ED precautions. Will check echocardiogram.  PAD: Patient with history of PAD s/p left external iliac artery PTA and stenting as well as right SFA stenting by Dr. Gery Pray in August 2007.  She has a known occluded SFA.  She has had multiple interventions on her right SFA including stenting for in-stent restenosis with documentation of acute stent occlusion requiring urgent right femoral popliteal bypass grafting by Dr. Hart Rochester, she ultimately underwent thrombectomy of thrombus By Dr. Darrick Penna in 12/2016.  In 6/20 teenage she underwent right BKA.  She wears a prosthesis and ambulates with a walker.  PAF: Patient with history of PAF.  Today she is in sinus rhythm.  She has been maintained on flecainide and Cardizem.  She denies palpitations or feelings of atrial fibrillation.  Question continued use of Flecainide given her history of PAD and nonobstructive CAD in 2014, will refer to afib clinic to discuss potential other options.  She denies any bleeding problems on Eliquis.  Patient's most recent creatinine on 08/31/2023 indicated creatinine level of 1.57, prior 1.60.  Will check BMET today if continues above 1.5 she will need a decreased dose of Eliquis to 2.5 mg twice daily given renal function and  weight. Check BMET and CBC.   Carotid artery stenosis: Carotid artery duplex on 12/13/2023 indicated 1 to 39% bilateral ICA stenosis.  To have repeat imaging in 1 year.  Left subclavian stenosis: Noted history of subclavian artery stenosis, patient denies any symptoms.  She does have a difference in blood pressure when comparing both arms. She will continue to monitor. Per Dr. Allyson Sabal to repeat carotid duplex in one year.   Hyperlipidemia: Patient without recent lipid profile.  Consider check on follow-up.  Patient is on Zetia and fenofibrate.  She has history of statin intolerance.  Hypertension: Blood pressure today in right arm 138/74.  Continue current antihypertensive regimen.  Polycythemia Vera: Followed by hematology. Has therapeutic phlebotomy when HCT >46%.   History of tobacco use: Patient reports that she has stopped smoking, congratulated.     Disposition: F/u with Reather Littler, NP in four weeks.   Signed, Rip Harbour, NP

## 2024-01-12 ENCOUNTER — Encounter: Payer: Self-pay | Admitting: Cardiology

## 2024-01-12 ENCOUNTER — Ambulatory Visit: Payer: 59 | Attending: Cardiology | Admitting: Cardiology

## 2024-01-12 VITALS — BP 138/74 | HR 71 | Ht 64.6 in | Wt 118.8 lb

## 2024-01-12 DIAGNOSIS — I2510 Atherosclerotic heart disease of native coronary artery without angina pectoris: Secondary | ICD-10-CM | POA: Diagnosis not present

## 2024-01-12 DIAGNOSIS — I739 Peripheral vascular disease, unspecified: Secondary | ICD-10-CM

## 2024-01-12 DIAGNOSIS — I708 Atherosclerosis of other arteries: Secondary | ICD-10-CM | POA: Diagnosis not present

## 2024-01-12 DIAGNOSIS — I480 Paroxysmal atrial fibrillation: Secondary | ICD-10-CM

## 2024-01-12 DIAGNOSIS — E782 Mixed hyperlipidemia: Secondary | ICD-10-CM

## 2024-01-12 DIAGNOSIS — R0602 Shortness of breath: Secondary | ICD-10-CM

## 2024-01-12 DIAGNOSIS — I10 Essential (primary) hypertension: Secondary | ICD-10-CM

## 2024-01-12 NOTE — Patient Instructions (Addendum)
Medication Instructions:  No changes *If you need a refill on your cardiac medications before your next appointment, please call your pharmacy*  Lab Work: Today we are going to draw CBC and Bmet If you have labs (blood work) drawn today and your tests are completely normal, you will receive your results only by: MyChart Message (if you have MyChart) OR A paper copy in the mail If you have any lab test that is abnormal or we need to change your treatment, we will call you to review the results.  Testing/Procedures: Your physician has requested that you have an echocardiogram. Echocardiography is a painless test that uses sound waves to create images of your heart. It provides your doctor with information about the size and shape of your heart and how well your heart's chambers and valves are working. This procedure takes approximately one hour. There are no restrictions for this procedure. Please do NOT wear cologne, perfume, aftershave, or lotions (deodorant is allowed). Please arrive 15 minutes prior to your appointment time.  Please note: We ask at that you not bring children with you during ultrasound (echo/ vascular) testing. Due to room size and safety concerns, children are not allowed in the ultrasound rooms during exams. Our front office staff cannot provide observation of children in our lobby area while testing is being conducted. An adult accompanying a patient to their appointment will only be allowed in the ultrasound room at the discretion of the ultrasound technician under special circumstances. We apologize for any inconvenience.  Follow-Up: At [facility], you and your health needs are our priority.  As part of our continuing mission to provide you with exceptional heart care, we have created designated Provider Care Teams.  These Care Teams include your primary Cardiologist (physician) and Advanced Practice Providers (APPs -  Physician Assistants and Nurse  Practitioners) who all work together to provide you with the care you need, when you need it.  We recommend signing up for the patient portal called "MyChart".  Sign up information is provided on this After Visit Summary.  MyChart is used to connect with patients for Virtual Visits (Telemedicine).  Patients are able to view lab/test results, encounter notes, upcoming appointments, etc.  Non-urgent messages can be sent to your provider as well.   To learn more about what you can do with MyChart, go to [email or web address].    Your next appointment:   4 week(s)  Provider:   Any APP  Other Instructions We are going to send a referral over to our Afib Clinic

## 2024-01-13 ENCOUNTER — Telehealth: Payer: Self-pay

## 2024-01-13 DIAGNOSIS — Z8673 Personal history of transient ischemic attack (TIA), and cerebral infarction without residual deficits: Secondary | ICD-10-CM

## 2024-01-13 DIAGNOSIS — I482 Chronic atrial fibrillation, unspecified: Secondary | ICD-10-CM

## 2024-01-13 DIAGNOSIS — I10 Essential (primary) hypertension: Secondary | ICD-10-CM

## 2024-01-13 DIAGNOSIS — E785 Hyperlipidemia, unspecified: Secondary | ICD-10-CM

## 2024-01-13 LAB — CBC
Hematocrit: 41.9 % (ref 34.0–46.6)
Hemoglobin: 12.2 g/dL (ref 11.1–15.9)
MCH: 20.7 pg — ABNORMAL LOW (ref 26.6–33.0)
MCHC: 29.1 g/dL — ABNORMAL LOW (ref 31.5–35.7)
MCV: 71 fL — ABNORMAL LOW (ref 79–97)
NRBC: 1 % — ABNORMAL HIGH (ref 0–0)
Platelets: 423 10*3/uL (ref 150–450)
RBC: 5.88 x10E6/uL — ABNORMAL HIGH (ref 3.77–5.28)
RDW: 21.2 % — ABNORMAL HIGH (ref 11.7–15.4)
WBC: 31.1 10*3/uL (ref 3.4–10.8)

## 2024-01-13 LAB — BASIC METABOLIC PANEL
BUN/Creatinine Ratio: 16 (ref 12–28)
BUN: 28 mg/dL — ABNORMAL HIGH (ref 8–27)
CO2: 15 mmol/L — ABNORMAL LOW (ref 20–29)
Calcium: 8.4 mg/dL — ABNORMAL LOW (ref 8.7–10.3)
Chloride: 105 mmol/L (ref 96–106)
Creatinine, Ser: 1.74 mg/dL — ABNORMAL HIGH (ref 0.57–1.00)
Glucose: 83 mg/dL (ref 70–99)
Potassium: 5.7 mmol/L — ABNORMAL HIGH (ref 3.5–5.2)
Sodium: 138 mmol/L (ref 134–144)
eGFR: 31 mL/min/{1.73_m2} — ABNORMAL LOW (ref >59)

## 2024-01-13 MED ORDER — DILTIAZEM HCL ER COATED BEADS 240 MG PO CP24: 240.0000 mg | ORAL_CAPSULE | Freq: Every day | ORAL | 3 refills | Status: DC

## 2024-01-13 MED ORDER — APIXABAN 2.5 MG PO TABS: 2.5000 mg | ORAL_TABLET | Freq: Two times a day (BID) | ORAL | 3 refills | Status: DC

## 2024-01-13 MED ORDER — FENOFIBRATE 145 MG PO TABS: 145.0000 mg | ORAL_TABLET | Freq: Every day | ORAL | 3 refills | Status: AC

## 2024-01-13 MED ORDER — EZETIMIBE 10 MG PO TABS
10.0000 mg | ORAL_TABLET | Freq: Every day | ORAL | 3 refills | Status: DC
Start: 2024-01-13 — End: 2024-05-03

## 2024-01-13 NOTE — Telephone Encounter (Signed)
-----   Message from Rip Harbour sent at 01/13/2024  1:29 PM EST ----- Please call and let [name] know that her white blood cell count remains elevated, she should continue following with hematology and oncology. Her kidney function is again reduced, as such we need to decrease her Eliquis to 2.5 mg twice daily, please send script. Her potassium is high at 5.7. Recommend she repeat a BMET tomorrow to recheck potassium level. Please ensure she is not taking any potassium supplements or drinking electrolyte beverages such as liquid IV or gatorade. She should reduce her intake of high potassium foods such as bananas, squash, yogurt, white beans, sweet potatoes, leafy greens, and avocados.

## 2024-01-13 NOTE — Telephone Encounter (Signed)
We need to have her decrease Aspirin to 81 mg and continue her current dose of the Eliquis at 5 mg until we are able to talk Dr. Allyson Sabal about the medication.  Tried to call patient to advise of this, left a detailed message on her phone will need to call back again on tuesday to confirm that she had received these instructions.

## 2024-01-13 NOTE — Telephone Encounter (Signed)
Called patient advised of below they verbalized understanding Patient may need to wait until Monday- Gave patient ED Precautions

## 2024-01-19 ENCOUNTER — Encounter: Payer: Self-pay | Admitting: Hematology

## 2024-01-21 ENCOUNTER — Emergency Department (HOSPITAL_COMMUNITY): Payer: 59

## 2024-01-21 ENCOUNTER — Encounter (HOSPITAL_COMMUNITY): Payer: Self-pay | Admitting: Emergency Medicine

## 2024-01-21 ENCOUNTER — Emergency Department (HOSPITAL_COMMUNITY)
Admission: EM | Admit: 2024-01-21 | Discharge: 2024-01-22 | Disposition: A | Discharge status: Home / Self Care | Payer: 59 | Attending: Emergency Medicine | Admitting: Emergency Medicine

## 2024-01-21 ENCOUNTER — Other Ambulatory Visit: Payer: Self-pay

## 2024-01-21 DIAGNOSIS — G43809 Other migraine, not intractable, without status migrainosus: Secondary | ICD-10-CM | POA: Diagnosis not present

## 2024-01-21 DIAGNOSIS — Z7982 Long term (current) use of aspirin: Secondary | ICD-10-CM | POA: Diagnosis not present

## 2024-01-21 DIAGNOSIS — I2510 Atherosclerotic heart disease of native coronary artery without angina pectoris: Secondary | ICD-10-CM | POA: Diagnosis not present

## 2024-01-21 DIAGNOSIS — Z8673 Personal history of transient ischemic attack (TIA), and cerebral infarction without residual deficits: Secondary | ICD-10-CM | POA: Insufficient documentation

## 2024-01-21 DIAGNOSIS — I10 Essential (primary) hypertension: Secondary | ICD-10-CM | POA: Diagnosis not present

## 2024-01-21 DIAGNOSIS — R519 Headache, unspecified: Secondary | ICD-10-CM | POA: Diagnosis present

## 2024-01-21 MED ORDER — PROCHLORPERAZINE EDISYLATE 10 MG/2ML IJ SOLN
5.0000 mg | Freq: Once | INTRAMUSCULAR | Status: AC
Start: 2024-01-21 — End: 2024-01-21
  Administered 2024-01-21: 5 mg via INTRAVENOUS
  Filled 2024-01-21: qty 2

## 2024-01-21 MED ORDER — DIPHENHYDRAMINE HCL 25 MG PO CAPS
25.0000 mg | ORAL_CAPSULE | Freq: Once | ORAL | Status: AC
  Administered 2024-01-21: 25 mg via ORAL
  Filled 2024-01-21: qty 1

## 2024-01-21 NOTE — Telephone Encounter (Signed)
Discussed with Dr. Allyson Sabal, Ms. Kreager should take aspirin 81 mg daily and Eliquis 2.5 mg twice daily, based on her weight and kidney function. Please let her know and remind her about need to recheck her potassium level, thank you!

## 2024-01-21 NOTE — ED Triage Notes (Addendum)
Pt with c/o headache all day. States granddaughter thinks she is having "mini-strokes" because "I have a tendency". Pt states she took two Extra Strength Tylenol pta.

## 2024-01-21 NOTE — ED Provider Notes (Signed)
[name]   CSN: [id number] Arrival date & time: 01/21/24  2300     History {Add pertinent medical, surgical, social history, OB history to HPI:1} Chief Complaint  Patient presents with   Headache    [name] is a 73 y.o. female.  HPI     This is a 73 year old female who presents with concern for headache.  Has a history of headaches and has been diagnosed with migraines in the past.  Reports she has had a progressively worsening headache throughout the day.  It is similar to prior headaches.  It is bitemporal.  Does report some nausea.  No vomiting.  She describes "getting flashers" in her eyes.  She states that she got a few before the headache started and then got a few during the headache.  Has not noted any strokelike symptoms.  Took Tylenol prior to arrival.  Home Medications Prior to Admission medications   Medication Sig Start Date End Date Taking? Authorizing Provider  acetaminophen (TYLENOL) 500 MG tablet Take 2 tablets (1,000 mg total) by mouth every 6 (six) hours. 09/16/18   Stacey Rude, PA-C  apixaban (ELIQUIS) 2.5 MG TABS tablet Take 1 tablet (2.5 mg total) by mouth 2 (two) times daily. 01/13/24   [name] D, NP  ARTIFICIAL TEAR OP Place 1 drop into both eyes as needed (for dry eyes).    [provider]  aspirin EC 325 MG tablet Take 325 mg by mouth daily.    [provider]  BELBUCA 75 MCG FILM Take by mouth. 11/16/23   [provider]  diclofenac Sodium (VOLTAREN) 1 % GEL Apply 2 g topically 4 (four) times daily. Patient taking differently: Apply 2 g topically 4 (four) times daily as needed (pain). 09/03/21   Stacey Drown, NP  diltiazem (CARDIZEM CD) 240 MG 24 hr capsule Take 1 capsule (240 mg total) by mouth daily. 01/13/24   [name] D, NP  escitalopram (LEXAPRO) 20 MG tablet TAKE ONE TABLET ONCE DAILY. NEEDS TO BE SEEN FOR REFILLS 08/09/23   Stacey Senate, FNP  ezetimibe (ZETIA) 10 MG tablet Take 1 tablet (10 mg total) by mouth daily. 01/13/24   [name] D, NP  fenofibrate (TRICOR) 145 MG tablet Take 1 tablet (145 mg total) by mouth daily. 01/13/24   [name] D, NP  flecainide (TAMBOCOR) 50 MG tablet Take 1 tablet (50 mg total) by mouth 2 (two) times daily. **NEEDS TO BE SEEN BEFORE NEXT REFILL** 12/28/23   Milian, Aleen Campi, FNP  Multiple Vitamins-Minerals (CENTRUM SILVER ADULT 50+ PO) Take by mouth.    [provider]  omeprazole (PRILOSEC) 20 MG capsule TAKE ONE CAPSULE TWICE DAILY BEFORE MEALS **NEEDS TO BE SEEN BEFORE NEXT REFILL** 12/28/23   Milian, Aleen Campi, FNP  triamcinolone cream (KENALOG) 0.1 % Apply 1 Application topically 2 (two) times daily. 02/09/23   Stacey Masters, FNP  valsartan (DIOVAN) 160 MG tablet TAKE ONE TABLET EVERY EVENING (WITH 320MG  IN THE MORNING) 12/07/23   Stacey Gess, MD  valsartan (DIOVAN) 320 MG tablet TAKE ONE TABLET EVERY MORNING (WITH 160MG  EVERY EVENING) 12/07/23   Stacey Gess, MD      Allergies    Atenolol, Demerol  [meperidine hcl], Demerol [meperidine], Gabapentin, Inderal [propranolol], Prednisone, Statins, Warfarin and related, Crestor [rosuvastatin], Docosahexaenoic acid (dha) (fish), Lactose intolerance (gi), Lipitor [atorvastatin], Lovaza [omega-3-acid ethyl esters (fish)], Penicillins, Pravastatin, Simvastatin, Trilipix [choline fenofibrate],  Warfarin, Hydralazine, Effexor [venlafaxine hcl], Nexium [esomeprazole magnesium], Percocet [oxycodone-acetaminophen], and Plavix [clopidogrel bisulfate]    Review of Systems   Review of Systems  Constitutional:  Negative for fever.  Eyes:  Positive for visual disturbance. Negative for photophobia.  Respiratory:  Negative for shortness of breath.   Cardiovascular:  Negative for chest pain.  Neurological:  Positive for headaches.  All other systems reviewed and are negative.   Physical Exam Updated Vital Signs BP (!)  113/96 (BP Location: Left Arm)   Pulse 75   Temp 98.3 F (36.8 C) (Oral)   Resp 18   Ht 1.626 m (5\' 4" )   Wt 54 kg   SpO2 94%   BMI 20.43 kg/m  Physical Exam Vitals and nursing [name] reviewed.  Constitutional:      Appearance: She is well-developed.  HENT:     Head: Normocephalic and atraumatic.     Mouth/Throat:     Mouth: Mucous membranes are moist.  Eyes:     Extraocular Movements: Extraocular movements intact.     Pupils: Pupils are equal, round, and reactive to light.  Cardiovascular:     Rate and Rhythm: Normal rate and regular rhythm.     Heart sounds: Normal heart sounds.  Pulmonary:     Effort: Pulmonary effort is normal. No respiratory distress.     Breath sounds: No wheezing.  Abdominal:     General: Bowel sounds are normal.     Palpations: Abdomen is soft.  Musculoskeletal:     Cervical back: Neck supple.     Comments: Amputation right leg  Skin:    General: Skin is warm and dry.  Neurological:     Mental Status: She is alert and oriented to person, place, and time.     Comments: Cranial nerves II through XII intact, 5 out of 5 strength in all 4 extremities, no dysmetria to finger-nose-finger  Psychiatric:        Mood and Affect: Mood normal.     ED Results / Procedures / Treatments   Labs (all labs ordered are listed, but only abnormal results are displayed) Labs Reviewed - No data to display  EKG None  Radiology No results found.  Procedures Procedures  {Document cardiac monitor, telemetry assessment procedure when appropriate:1}  Medications Ordered in ED Medications  prochlorperazine (COMPAZINE) injection 5 mg (has no administration in time range)  diphenhydrAMINE (BENADRYL) capsule 25 mg (has no administration in time range)    ED Course/ Medical Decision Making/ A&P   {   Click here for ABCD2, HEART and other calculatorsREFRESH [name] before signing :1}                              Medical Decision Making Amount and/or Complexity of  Data Reviewed Radiology: ordered.  Risk Prescription drug management.   ***  {Document critical care time when appropriate:1} {Document review of labs and clinical decision tools ie heart score, Chads2Vasc2 etc:1}  {Document your independent review of radiology images, and any outside records:1} {Document your discussion with family members, caretakers, and with consultants:1} {Document social determinants of health affecting pt's care:1} {Document your decision making why or why not admission, treatments were needed:1} Final Clinical Impression(s) / ED Diagnoses Final diagnoses:  None    Rx / DC Orders ED Discharge Orders     None

## 2024-01-21 NOTE — Telephone Encounter (Signed)
 Called patient advised of below they verbalized understanding.

## 2024-01-22 ENCOUNTER — Encounter: Payer: Self-pay | Admitting: Hematology

## 2024-01-22 NOTE — Discharge Instructions (Signed)
You were seen today for headache.  Is likely consistent with your migraines.  Take Tylenol as needed.  Make sure that you are staying hydrated.

## 2024-01-24 ENCOUNTER — Emergency Department (HOSPITAL_COMMUNITY)
Admission: EM | Admit: 2024-01-24 | Discharge: 2024-01-25 | Discharge status: Against Medical Advice | Payer: 59 | Attending: Emergency Medicine | Admitting: Emergency Medicine

## 2024-01-24 DIAGNOSIS — G43909 Migraine, unspecified, not intractable, without status migrainosus: Secondary | ICD-10-CM | POA: Insufficient documentation

## 2024-01-24 DIAGNOSIS — Z5321 Procedure and treatment not carried out due to patient leaving prior to being seen by health care provider: Secondary | ICD-10-CM | POA: Diagnosis not present

## 2024-01-24 LAB — CBC
HCT: 46.8 % — ABNORMAL HIGH (ref 36.0–46.0)
Hemoglobin: 13.1 g/dL (ref 12.0–15.0)
MCH: 20 pg — ABNORMAL LOW (ref 26.0–34.0)
MCHC: 28 g/dL — ABNORMAL LOW (ref 30.0–36.0)
MCV: 71.5 fL — ABNORMAL LOW (ref 80.0–100.0)
Platelets: 458 10*3/uL — ABNORMAL HIGH (ref 150–400)
RBC: 6.55 MIL/uL — ABNORMAL HIGH (ref 3.87–5.11)
RDW: 23 % — ABNORMAL HIGH (ref 11.5–15.5)
WBC: 38.7 10*3/uL — ABNORMAL HIGH (ref 4.0–10.5)
nRBC: 0.6 % — ABNORMAL HIGH (ref 0.0–0.2)

## 2024-01-24 LAB — URINALYSIS, ROUTINE W REFLEX MICROSCOPIC
Bacteria, UA: NONE SEEN
Bilirubin Urine: NEGATIVE
Glucose, UA: 150 mg/dL — AB
Hgb urine dipstick: NEGATIVE
Ketones, ur: NEGATIVE mg/dL
Leukocytes,Ua: NEGATIVE
Nitrite: NEGATIVE
Protein, ur: >=300 mg/dL — AB
Specific Gravity, Urine: 1.017 (ref 1.005–1.030)
pH: 5 (ref 5.0–8.0)

## 2024-01-24 LAB — BASIC METABOLIC PANEL
Anion gap: 9 (ref 5–15)
BUN: 24 mg/dL — ABNORMAL HIGH (ref 8–23)
CO2: 16 mmol/L — ABNORMAL LOW (ref 22–32)
Calcium: 8.3 mg/dL — ABNORMAL LOW (ref 8.9–10.3)
Chloride: 111 mmol/L (ref 98–111)
Creatinine, Ser: 1.38 mg/dL — ABNORMAL HIGH (ref 0.44–1.00)
GFR, Estimated: 41 mL/min — ABNORMAL LOW (ref >60)
Glucose, Bld: 93 mg/dL (ref 70–99)
Potassium: 4.8 mmol/L (ref 3.5–5.1)
Sodium: 136 mmol/L (ref 135–145)

## 2024-01-24 NOTE — ED Provider Triage Note (Signed)
Emergency Medicine Provider Triage Evaluation Note  [name] , a 73 y.o. female  was evaluated in triage.  Pt complains of migraine.  Patient evaluated for migraine on Friday, 1/31 at any pen.  Patient reports that her headache never went away, she was discharged.  She had a CT scan of her head performed which was unremarkable.  Reports a history of headaches.  States she is also having nausea and vomiting because of her headaches and migraines.  Denies any new features since Friday.  Reports that this headache has been a continuation of the 1 that she will to the ED at [facility] on Friday 4.  Denies any neck pain, one-sided weakness.    Review of Systems  Positive:  Negative:   Physical Exam  There were no vitals taken for this visit. Gen:   Awake, no distress   Resp:  Normal effort  MSK:   Moves extremities without difficulty  Other:  Reassuring neurological examination, no focal neurodeficits  Medical Decision Making  Medically screening exam initiated at 10:57 PM.  Appropriate orders placed.  [name] was informed that the remainder of the evaluation will be completed by another provider, this initial triage assessment does not replace that evaluation, and the importance of remaining in the ED until their evaluation is complete.     Al Decant, PA-C 01/24/24 2258

## 2024-01-24 NOTE — ED Triage Notes (Signed)
Pt endorsing nausea and vomiting. Reports headaches continued since last seen on 1/31. Family states she is sleeping more and not eating and having numbness in face and R arm intermittently.

## 2024-01-25 ENCOUNTER — Encounter: Payer: Self-pay | Admitting: Hematology

## 2024-01-25 NOTE — ED Notes (Signed)
 Pt stated she was leaving and was seen leaving the ED

## 2024-01-27 ENCOUNTER — Ambulatory Visit (HOSPITAL_COMMUNITY): Payer: 59 | Admitting: Internal Medicine

## 2024-01-27 ENCOUNTER — Other Ambulatory Visit: Payer: Self-pay | Admitting: Family Medicine

## 2024-01-27 DIAGNOSIS — I482 Chronic atrial fibrillation, unspecified: Secondary | ICD-10-CM

## 2024-01-27 DIAGNOSIS — K219 Gastro-esophageal reflux disease without esophagitis: Secondary | ICD-10-CM

## 2024-01-28 ENCOUNTER — Other Ambulatory Visit: Payer: Self-pay

## 2024-01-28 ENCOUNTER — Emergency Department (HOSPITAL_COMMUNITY): Payer: 59

## 2024-01-28 ENCOUNTER — Inpatient Hospital Stay (HOSPITAL_COMMUNITY)
Admission: EM | Admit: 2024-01-28 | Discharge: 2024-02-02 | DRG: 070 | Disposition: A | Discharge status: Home Health | Payer: 59 | Attending: Internal Medicine | Admitting: Internal Medicine

## 2024-01-28 ENCOUNTER — Encounter (HOSPITAL_COMMUNITY): Payer: Self-pay | Admitting: Emergency Medicine

## 2024-01-28 ENCOUNTER — Encounter: Payer: Self-pay | Admitting: Family Medicine

## 2024-01-28 ENCOUNTER — Ambulatory Visit (INDEPENDENT_AMBULATORY_CARE_PROVIDER_SITE_OTHER): Payer: 59 | Admitting: Family Medicine

## 2024-01-28 VITALS — BP 135/62 | HR 70 | Temp 97.6°F | Ht 64.0 in | Wt 119.0 lb

## 2024-01-28 DIAGNOSIS — I10 Essential (primary) hypertension: Secondary | ICD-10-CM | POA: Diagnosis present

## 2024-01-28 DIAGNOSIS — Z7901 Long term (current) use of anticoagulants: Secondary | ICD-10-CM

## 2024-01-28 DIAGNOSIS — M25531 Pain in right wrist: Secondary | ICD-10-CM

## 2024-01-28 DIAGNOSIS — I480 Paroxysmal atrial fibrillation: Secondary | ICD-10-CM | POA: Diagnosis present

## 2024-01-28 DIAGNOSIS — Z95820 Peripheral vascular angioplasty status with implants and grafts: Secondary | ICD-10-CM

## 2024-01-28 DIAGNOSIS — K219 Gastro-esophageal reflux disease without esophagitis: Secondary | ICD-10-CM | POA: Diagnosis present

## 2024-01-28 DIAGNOSIS — R1110 Vomiting, unspecified: Secondary | ICD-10-CM | POA: Diagnosis present

## 2024-01-28 DIAGNOSIS — D45 Polycythemia vera: Secondary | ICD-10-CM

## 2024-01-28 DIAGNOSIS — H409 Unspecified glaucoma: Secondary | ICD-10-CM | POA: Diagnosis present

## 2024-01-28 DIAGNOSIS — Z79899 Other long term (current) drug therapy: Secondary | ICD-10-CM

## 2024-01-28 DIAGNOSIS — R519 Headache, unspecified: Secondary | ICD-10-CM

## 2024-01-28 DIAGNOSIS — I482 Chronic atrial fibrillation, unspecified: Secondary | ICD-10-CM

## 2024-01-28 DIAGNOSIS — I340 Nonrheumatic mitral (valve) insufficiency: Secondary | ICD-10-CM | POA: Diagnosis present

## 2024-01-28 DIAGNOSIS — D509 Iron deficiency anemia, unspecified: Secondary | ICD-10-CM | POA: Diagnosis present

## 2024-01-28 DIAGNOSIS — E785 Hyperlipidemia, unspecified: Secondary | ICD-10-CM | POA: Diagnosis present

## 2024-01-28 DIAGNOSIS — G4452 New daily persistent headache (NDPH): Secondary | ICD-10-CM

## 2024-01-28 DIAGNOSIS — C8592 Non-Hodgkin lymphoma, unspecified, intrathoracic lymph nodes: Secondary | ICD-10-CM | POA: Diagnosis present

## 2024-01-28 DIAGNOSIS — K760 Fatty (change of) liver, not elsewhere classified: Secondary | ICD-10-CM | POA: Diagnosis present

## 2024-01-28 DIAGNOSIS — I6523 Occlusion and stenosis of bilateral carotid arteries: Secondary | ICD-10-CM

## 2024-01-28 DIAGNOSIS — N1832 Chronic kidney disease, stage 3b: Secondary | ICD-10-CM

## 2024-01-28 DIAGNOSIS — Z8673 Personal history of transient ischemic attack (TIA), and cerebral infarction without residual deficits: Secondary | ICD-10-CM

## 2024-01-28 DIAGNOSIS — I252 Old myocardial infarction: Secondary | ICD-10-CM

## 2024-01-28 DIAGNOSIS — Z66 Do not resuscitate: Secondary | ICD-10-CM | POA: Diagnosis present

## 2024-01-28 DIAGNOSIS — I959 Hypotension, unspecified: Secondary | ICD-10-CM | POA: Diagnosis not present

## 2024-01-28 DIAGNOSIS — D649 Anemia, unspecified: Secondary | ICD-10-CM | POA: Diagnosis present

## 2024-01-28 DIAGNOSIS — Z7982 Long term (current) use of aspirin: Secondary | ICD-10-CM

## 2024-01-28 DIAGNOSIS — Z800 Family history of malignant neoplasm of digestive organs: Secondary | ICD-10-CM

## 2024-01-28 DIAGNOSIS — Z87891 Personal history of nicotine dependence: Secondary | ICD-10-CM

## 2024-01-28 DIAGNOSIS — R410 Disorientation, unspecified: Secondary | ICD-10-CM | POA: Diagnosis not present

## 2024-01-28 DIAGNOSIS — Z888 Allergy status to other drugs, medicaments and biological substances status: Secondary | ICD-10-CM

## 2024-01-28 DIAGNOSIS — I130 Hypertensive heart and chronic kidney disease with heart failure and stage 1 through stage 4 chronic kidney disease, or unspecified chronic kidney disease: Principal | ICD-10-CM | POA: Diagnosis present

## 2024-01-28 DIAGNOSIS — Z1152 Encounter for screening for COVID-19: Secondary | ICD-10-CM

## 2024-01-28 DIAGNOSIS — Z89611 Acquired absence of right leg above knee: Secondary | ICD-10-CM

## 2024-01-28 DIAGNOSIS — I952 Hypotension due to drugs: Secondary | ICD-10-CM | POA: Diagnosis present

## 2024-01-28 DIAGNOSIS — I272 Pulmonary hypertension, unspecified: Secondary | ICD-10-CM | POA: Diagnosis present

## 2024-01-28 DIAGNOSIS — Z885 Allergy status to narcotic agent status: Secondary | ICD-10-CM

## 2024-01-28 DIAGNOSIS — G9341 Metabolic encephalopathy: Secondary | ICD-10-CM | POA: Diagnosis not present

## 2024-01-28 DIAGNOSIS — I5033 Acute on chronic diastolic (congestive) heart failure: Secondary | ICD-10-CM | POA: Diagnosis present

## 2024-01-28 DIAGNOSIS — Z801 Family history of malignant neoplasm of trachea, bronchus and lung: Secondary | ICD-10-CM

## 2024-01-28 DIAGNOSIS — Z880 Allergy status to penicillin: Secondary | ICD-10-CM

## 2024-01-28 DIAGNOSIS — E739 Lactose intolerance, unspecified: Secondary | ICD-10-CM | POA: Diagnosis present

## 2024-01-28 DIAGNOSIS — D72829 Elevated white blood cell count, unspecified: Secondary | ICD-10-CM | POA: Diagnosis present

## 2024-01-28 DIAGNOSIS — R1114 Bilious vomiting: Secondary | ICD-10-CM

## 2024-01-28 DIAGNOSIS — I708 Atherosclerosis of other arteries: Secondary | ICD-10-CM | POA: Diagnosis present

## 2024-01-28 DIAGNOSIS — J40 Bronchitis, not specified as acute or chronic: Secondary | ICD-10-CM | POA: Diagnosis present

## 2024-01-28 DIAGNOSIS — I739 Peripheral vascular disease, unspecified: Secondary | ICD-10-CM

## 2024-01-28 DIAGNOSIS — I2510 Atherosclerotic heart disease of native coronary artery without angina pectoris: Secondary | ICD-10-CM | POA: Diagnosis present

## 2024-01-28 DIAGNOSIS — J9601 Acute respiratory failure with hypoxia: Secondary | ICD-10-CM | POA: Diagnosis present

## 2024-01-28 DIAGNOSIS — G43909 Migraine, unspecified, not intractable, without status migrainosus: Secondary | ICD-10-CM | POA: Diagnosis present

## 2024-01-28 DIAGNOSIS — R4182 Altered mental status, unspecified: Secondary | ICD-10-CM | POA: Diagnosis present

## 2024-01-28 DIAGNOSIS — E782 Mixed hyperlipidemia: Secondary | ICD-10-CM | POA: Diagnosis present

## 2024-01-28 DIAGNOSIS — J479 Bronchiectasis, uncomplicated: Secondary | ICD-10-CM | POA: Diagnosis present

## 2024-01-28 DIAGNOSIS — E872 Acidosis, unspecified: Secondary | ICD-10-CM | POA: Diagnosis present

## 2024-01-28 DIAGNOSIS — Z8249 Family history of ischemic heart disease and other diseases of the circulatory system: Secondary | ICD-10-CM

## 2024-01-28 LAB — COMPREHENSIVE METABOLIC PANEL
ALT: 12 U/L (ref 0–44)
AST: 20 U/L (ref 15–41)
Albumin: 2.9 g/dL — ABNORMAL LOW (ref 3.5–5.0)
Alkaline Phosphatase: 90 U/L (ref 38–126)
Anion gap: 11 (ref 5–15)
BUN: 30 mg/dL — ABNORMAL HIGH (ref 8–23)
CO2: 16 mmol/L — ABNORMAL LOW (ref 22–32)
Calcium: 8.4 mg/dL — ABNORMAL LOW (ref 8.9–10.3)
Chloride: 109 mmol/L (ref 98–111)
Creatinine, Ser: 1.54 mg/dL — ABNORMAL HIGH (ref 0.44–1.00)
GFR, Estimated: 36 mL/min — ABNORMAL LOW (ref >60)
Glucose, Bld: 101 mg/dL — ABNORMAL HIGH (ref 70–99)
Potassium: 4.5 mmol/L (ref 3.5–5.1)
Sodium: 136 mmol/L (ref 135–145)
Total Bilirubin: 0.8 mg/dL (ref 0.0–1.2)
Total Protein: 6.1 g/dL — ABNORMAL LOW (ref 6.5–8.1)

## 2024-01-28 LAB — URINALYSIS, ROUTINE W REFLEX MICROSCOPIC
Bacteria, UA: NONE SEEN
Bilirubin Urine: NEGATIVE
Glucose, UA: 150 mg/dL — AB
Ketones, ur: NEGATIVE mg/dL
Leukocytes,Ua: NEGATIVE
Nitrite: NEGATIVE
Protein, ur: >=300 mg/dL — AB
Specific Gravity, Urine: 1.02 (ref 1.005–1.030)
pH: 5 (ref 5.0–8.0)

## 2024-01-28 LAB — CBC WITH DIFFERENTIAL/PLATELET
Abs Immature Granulocytes: 1.79 10*3/uL — ABNORMAL HIGH (ref 0.00–0.07)
Basophils Absolute: 0.5 10*3/uL — ABNORMAL HIGH (ref 0.0–0.1)
Basophils Relative: 2 %
Eosinophils Absolute: 0.4 10*3/uL (ref 0.0–0.5)
Eosinophils Relative: 1 %
HCT: 46.8 % — ABNORMAL HIGH (ref 36.0–46.0)
Hemoglobin: 12.9 g/dL (ref 12.0–15.0)
Immature Granulocytes: 5 %
Lymphocytes Relative: 3 %
Lymphs Abs: 1.1 10*3/uL (ref 0.7–4.0)
MCH: 20 pg — ABNORMAL LOW (ref 26.0–34.0)
MCHC: 27.6 g/dL — ABNORMAL LOW (ref 30.0–36.0)
MCV: 72.7 fL — ABNORMAL LOW (ref 80.0–100.0)
Monocytes Absolute: 0.8 10*3/uL (ref 0.1–1.0)
Monocytes Relative: 2 %
Neutro Abs: 30.2 10*3/uL — ABNORMAL HIGH (ref 1.7–7.7)
Neutrophils Relative %: 87 %
Platelets: 356 10*3/uL (ref 150–400)
RBC: 6.44 MIL/uL — ABNORMAL HIGH (ref 3.87–5.11)
RDW: 22.8 % — ABNORMAL HIGH (ref 11.5–15.5)
WBC Morphology: ABNORMAL
WBC: 34.8 10*3/uL — ABNORMAL HIGH (ref 4.0–10.5)
nRBC: 0.4 % — ABNORMAL HIGH (ref 0.0–0.2)

## 2024-01-28 LAB — TROPONIN I (HIGH SENSITIVITY)
Troponin I (High Sensitivity): 35 ng/L — ABNORMAL HIGH (ref <18)
Troponin I (High Sensitivity): 45 ng/L — ABNORMAL HIGH (ref <18)

## 2024-01-28 LAB — CBG MONITORING, ED
Glucose-Capillary: 70 mg/dL (ref 70–99)
Glucose-Capillary: 80 mg/dL (ref 70–99)

## 2024-01-28 LAB — RESP PANEL BY RT-PCR (RSV, FLU A&B, COVID)  RVPGX2
Influenza A by PCR: NEGATIVE
Influenza B by PCR: NEGATIVE
Resp Syncytial Virus by PCR: NEGATIVE
SARS Coronavirus 2 by RT PCR: NEGATIVE

## 2024-01-28 LAB — LACTIC ACID, PLASMA: Lactic Acid, Venous: 0.7 mmol/L (ref 0.5–1.9)

## 2024-01-28 MED ORDER — ACETAMINOPHEN 325 MG PO TABS
650.0000 mg | ORAL_TABLET | Freq: Once | ORAL | Status: AC
  Administered 2024-01-28: 650 mg via ORAL
  Filled 2024-01-28: qty 2

## 2024-01-28 MED ORDER — LACTATED RINGERS IV BOLUS
1000.0000 mL | Freq: Once | INTRAVENOUS | Status: AC
  Administered 2024-01-28: 1000 mL via INTRAVENOUS

## 2024-01-28 NOTE — ED Notes (Signed)
 Pt placed on 2 L of O2. SPO2 came up from 88% to 96%.

## 2024-01-28 NOTE — ED Provider Notes (Signed)
 [name] EMERGENCY DEPARTMENT AT [facility] Provider Note   CSN: [id number] Arrival date & time: 01/28/24  1638     History  Chief Complaint  Patient presents with   Weakness   Nausea    [name] is a 73 y.o. female with pmhx of HTN, a fib (on Eliquis ), CAD, HLD, CVA, TIA (no deficits), polycythemia vera, JAK2 presents to ED for evaluation of frontal HA for past couple of days. She endorses an episode of vomiting and one episode of  diarrhea.  Upon questioning of granddaughter, she reports that patient has had increased confusion over past 3 weeks.  She states that at times patient does not recognize family members.  She has had increased agitation.  She states concern for stroke as patient complains of numbness on her body but is unable to localize where so we will hit both sides of her arms to try and improve numbness.  Granddaughter notes multiple episodes of vomiting over past couple days.  Patient manages own medications and granddaughter is unsure if she is taking them correctly as she has missed doses in the past prior to this event  Granddaughter attempted to have patient seen on 01/24/2024 for similar symptoms but following over 8 hours in waiting room, they left without being seen by EDP.  She was seen on 01/21/24 at [facility] ED for migraine. CT neg. Provided compazine  and benadryl  for migraine with resolution.  Currently complains of HA behind eyes similar to previous migraines. Is poor historian and does not recall what daughter is reporting. No visual disturbances, recent falls.  The history is provided by the patient and a relative. The history is limited by the condition of the patient.  Weakness Associated symptoms: headaches   Associated symptoms: no abdominal pain, no chest pain, no cough, no diarrhea, no dizziness, no fever, no nausea, no seizures, no shortness of breath and no vomiting       Home Medications Prior to Admission medications   Medication  Sig Start Date End Date Taking? Authorizing Provider  acetaminophen  (TYLENOL ) 500 MG tablet Take 2 tablets (1,000 mg total) by mouth every 6 (six) hours. 09/16/18   Vicci Burnard SAUNDERS, PA-C  apixaban  (ELIQUIS ) 2.5 MG TABS tablet Take 1 tablet (2.5 mg total) by mouth 2 (two) times daily. 01/13/24   West, Katlyn D, NP  ARTIFICIAL TEAR OP Place 1 drop into both eyes as needed (for dry eyes).    [provider]  aspirin  EC 325 MG tablet Take 325 mg by mouth daily.    [provider]  diclofenac  Sodium (VOLTAREN ) 1 % GEL Apply 2 g topically 4 (four) times daily. Patient taking differently: Apply 2 g topically 4 (four) times daily as needed (pain). 09/03/21   Ijaola, Onyeje M, NP  diltiazem  (CARDIZEM  CD) 240 MG 24 hr capsule Take 1 capsule (240 mg total) by mouth daily. 01/13/24   West, Katlyn D, NP  escitalopram  (LEXAPRO ) 20 MG tablet TAKE ONE TABLET ONCE DAILY. NEEDS TO BE SEEN FOR REFILLS 08/09/23   Cathlene Marry Lenis, FNP  ezetimibe  (ZETIA ) 10 MG tablet Take 1 tablet (10 mg total) by mouth daily. 01/13/24   West, Katlyn D, NP  fenofibrate  (TRICOR ) 145 MG tablet Take 1 tablet (145 mg total) by mouth daily. 01/13/24   West, Katlyn D, NP  flecainide  (TAMBOCOR ) 50 MG tablet TAKE ONE TABLET TWICE DAILY 01/27/24   Milian, Marry Lenis, FNP  omeprazole  (PRILOSEC) 20 MG capsule TAKE ONE CAPSULE TWICE  DAILY BEFORE MEALS 01/27/24   Milian, Marry Lenis, FNP  triamcinolone  cream (KENALOG ) 0.1 % Apply 1 Application topically 2 (two) times daily. 02/09/23   Severa Rock HERO, FNP  valsartan  (DIOVAN ) 160 MG tablet TAKE ONE TABLET EVERY EVENING (WITH 320MG  IN THE MORNING) 12/07/23   Court Dorn PARAS, MD  valsartan  (DIOVAN ) 320 MG tablet TAKE ONE TABLET EVERY MORNING (WITH 160MG  EVERY EVENING) 12/07/23   Court Dorn PARAS, MD      Allergies    Atenolol, Demerol   [meperidine  hcl], Demerol  [meperidine ], Gabapentin , Inderal [propranolol], Prednisone, Statins, Warfarin and related, Crestor [rosuvastatin],  Docosahexaenoic acid (dha) (fish), Lactose intolerance (gi), Lipitor [atorvastatin ], Lovaza [omega-3-acid ethyl esters (fish)], Penicillins, Pravastatin , Simvastatin , Trilipix [choline fenofibrate ], Warfarin, Hydralazine , Effexor [venlafaxine hcl], Nexium [esomeprazole magnesium ], Percocet [oxycodone -acetaminophen ], and Plavix  [clopidogrel  bisulfate]    Review of Systems   Review of Systems  Constitutional:  Negative for chills, fatigue and fever.  Respiratory:  Negative for cough, chest tightness, shortness of breath and wheezing.   Cardiovascular:  Negative for chest pain and palpitations.  Gastrointestinal:  Negative for abdominal pain, constipation, diarrhea, nausea and vomiting.  Neurological:  Positive for weakness and headaches. Negative for dizziness, seizures, light-headedness and numbness.    Physical Exam Updated Vital Signs BP (!) 83/69   Pulse 90   Temp 98 F (36.7 C) (Oral)   Resp (!) 21   Ht 5' 4 (1.626 m)   Wt 54 kg   SpO2 93%   BMI 20.43 kg/m  Physical Exam Vitals and nursing note reviewed.  Constitutional:      General: She is not in acute distress.    Appearance: Normal appearance. She is not ill-appearing.  HENT:     Head: Normocephalic and atraumatic.     Right Ear: Tympanic membrane, ear canal and external ear normal.     Left Ear: Tympanic membrane, ear canal and external ear normal.     Mouth/Throat:     Mouth: Mucous membranes are moist.     Pharynx: No oropharyngeal exudate or posterior oropharyngeal erythema.     Comments: Uvula midline. No abscess, fluctuance, erythema noted in mouth Eyes:     General: No scleral icterus.       Right eye: No discharge.        Left eye: No discharge.     Conjunctiva/sclera: Conjunctivae normal.  Cardiovascular:     Rate and Rhythm: Normal rate.     Pulses: Normal pulses.  Pulmonary:     Effort: Pulmonary effort is normal. No respiratory distress.     Breath sounds: Normal breath sounds. No stridor. No wheezing  or rhonchi.  Chest:     Chest wall: No tenderness.  Abdominal:     General: There is no distension.     Palpations: Abdomen is soft. There is no mass.     Tenderness: There is no abdominal tenderness. There is no guarding.  Musculoskeletal:     Cervical back: Normal range of motion and neck supple. No rigidity or tenderness.     Left lower leg: No edema.     Comments: Above R knee amputation  Lymphadenopathy:     Cervical: No cervical adenopathy.  Skin:    General: Skin is warm.     Capillary Refill: Capillary refill takes less than 2 seconds.     Coloration: Skin is not jaundiced or pale.     Comments: Few abrasions without hemorrhage to LLE due to patient picking at skin  Neurological:  Mental Status: She is alert and oriented to person, place, and time. Mental status is at baseline.     Cranial Nerves: No cranial nerve deficit.     Sensory: No sensory deficit.     Motor: No weakness.     Comments: Follows commands without difficulty.  Sensation 2/2 of dermatomes C4-T2 and L2-S2. Motor 5/5 of BLE and BUE  Psychiatric:        Behavior: Behavior is cooperative.        Cognition and Memory: Memory is impaired. She exhibits impaired recent memory.    ED Results / Procedures / Treatments   Labs (all labs ordered are listed, but only abnormal results are displayed) Labs Reviewed  CBC WITH DIFFERENTIAL/PLATELET - Abnormal; Notable for the following components:      Result Value   WBC 34.8 (*)    RBC 6.44 (*)    HCT 46.8 (*)    MCV 72.7 (*)    MCH 20.0 (*)    MCHC 27.6 (*)    RDW 22.8 (*)    nRBC 0.4 (*)    Neutro Abs 30.2 (*)    Basophils Absolute 0.5 (*)    Abs Immature Granulocytes 1.79 (*)    All other components within normal limits  COMPREHENSIVE METABOLIC PANEL - Abnormal; Notable for the following components:   CO2 16 (*)    Glucose, Bld 101 (*)    BUN 30 (*)    Creatinine, Ser 1.54 (*)    Calcium  8.4 (*)    Total Protein 6.1 (*)    Albumin  2.9 (*)    GFR,  Estimated 36 (*)    All other components within normal limits  URINALYSIS, ROUTINE W REFLEX MICROSCOPIC - Abnormal; Notable for the following components:   Glucose, UA 150 (*)    Hgb urine dipstick SMALL (*)    Protein, ur >=300 (*)    All other components within normal limits  TROPONIN I (HIGH SENSITIVITY) - Abnormal; Notable for the following components:   Troponin I (High Sensitivity) 35 (*)    All other components within normal limits  TROPONIN I (HIGH SENSITIVITY) - Abnormal; Notable for the following components:   Troponin I (High Sensitivity) 45 (*)    All other components within normal limits  RESP PANEL BY RT-PCR (RSV, FLU A&B, COVID)  RVPGX2  CULTURE, BLOOD (ROUTINE X 2)  CULTURE, BLOOD (ROUTINE X 2)  LACTIC ACID, PLASMA  CBG MONITORING, ED  CBG MONITORING, ED    EKG None  Radiology DG Chest 2 View Result Date: 01/28/2024 CLINICAL DATA:  Generalized weakness and mood changes. EXAM: CHEST - 2 VIEW COMPARISON:  November 27, 2022 FINDINGS: The heart size and mediastinal contours are within normal limits. There is marked severity calcification of the aortic arch. There is prominence of the central pulmonary vasculature with mild diffusely increased interstitial lung markings. Mild to moderate severity areas of superimposed atelectasis and/or infiltrate are seen within the bilateral lung bases. No pleural effusion or pneumothorax is identified. Multilevel degenerative changes are seen throughout the thoracic spine. IMPRESSION: 1. Mild pulmonary vascular congestion with associated interstitial edema. 2. Mild bibasilar atelectasis and/or infiltrate. Electronically Signed   By: Suzen Dials M.D.   On: 01/28/2024 19:49    Procedures Procedures    Medications Ordered in ED Medications  lactated ringers  bolus 1,000 mL (0 mLs Intravenous Stopped 01/28/24 2024)  acetaminophen  (TYLENOL ) tablet 650 mg (650 mg Oral Given 01/28/24 2310)    ED Course/ Medical Decision Making/  A&P Clinical Course as of 01/28/24 2357  Fri Jan 28, 2024  2311 WBC(!): 34.8 Chronic leukocytosis d/t polycythemia vera and JAK2. Baseline 25-38 over past six months [LB]    Clinical Course User Index [LB] Minnie Tinnie BRAVO, PA                                 Medical Decision Making Amount and/or Complexity of Data Reviewed Labs: ordered. Decision-making details documented in ED Course. Radiology: ordered.  Risk OTC drugs. Decision regarding hospitalization.   Patient presents to the ED for concern of HA, confusion, this involves an extensive number of treatment options, and is a complaint that carries with it a high risk of complications and morbidity.  The differential diagnosis includes migraine, electrolyte abnormality, new onset neurological disorder, ACS, pneumonia, UTI, polypharmacy   Co morbidities that complicate the patient evaluation  See HPI Pt is poor historian   Additional history obtained:  Additional history obtained from [facility], Nursing, and Outside Medical Records   External records from outside source obtained and reviewed including  Granddaughter  Triage RN note ED visit from 01/24/2024 with a left prior to EDP evaluation ED visit from 01/21/2024 for migraine with negative CT   Lab Tests:  I Ordered, and personally interpreted labs.  The pertinent results include:   Leukocytosis of 34.8 (elevated due to polycythemia vera and Jak plus.  Baseline 25-38) CBG 101 BUN 30 (baseline 24-36) Creatinine 1.54 (baseline 1.2 9-1.74 over past 11 months) Lactic acid WNL Troponin 35 then 45 (has been 41-76 over past year likely due to CKD)   Imaging Studies ordered:  I ordered imaging studies including CXR  I independently visualized and interpreted imaging which showed interstitial edema I agree with the radiologist interpretation   Cardiac Monitoring:  The patient was maintained on a cardiac monitor.  I personally viewed and interpreted the cardiac  monitored which showed an underlying rhythm of: NSR   Medicines ordered and prescription drug management:  I ordered medication including LR and tylenol   for hypotension and HA  Reevaluation of the patient after these medicines showed that the patient improved I have reviewed the patients home medicines and have made adjustments as needed    Consultations Obtained:  I requested consultation with hospitalist Dr. Manfred,  and discussed lab and imaging findings as well as pertinent plan - accepts patient for admission for hypotension   Problem List / ED Course:  Hypotension Initial systolic blood pressure in high 80s.  Initially responded to 1 L LR bolus improving blood pressure to upper 90s to low 100s temporarily however went back down to 85 systolic most recently.  Cannot continuously give her fluid boluses as she has CKD Question sepsis versus polypharmacy versus severe dehydration from diarrhea.  Chronically elevated leukocytosis secondary to polycythemia vera, chest x-ray significant for mild vascular congestion and bibasilar atelectasis and/or infiltrate.  Lactic WNL.  Cultures pending.  No tachycardia nor fever here in the emergency department Diarrhea Patient endorses 1 episode of diarrhea however granddaughter reports multiple over past week.  No episodes of diarrhea here in the emergency department.  No abdominal pain HA Endorses headache behind the eyes similar to previous migraines. Similar to migraine from 01/21/24 with neg CT. Will provide Tylenol  for pain.  Cannot give NSAIDs as she has CKD No visual disturbances.  No recent falls.   Reevaluation:  After the interventions noted above, I reevaluated the patient  and found that they have :stayed the same    Dispostion:  After consideration of the diagnostic results and the patients response to treatment, I feel that the patent would benefit from admission for obs of hypotension.   Discussed patient, ED workup with  Dr. Patsey who agrees with plan Final Clinical Impression(s) / ED Diagnoses Final diagnoses:  Hypotension, unspecified hypotension type  Nonintractable headache, unspecified chronicity pattern, unspecified headache type    Rx / DC Orders ED Discharge Orders     None         Minnie Tinnie BRAVO, PA 01/29/24 0041    Patsey Lot, MD 01/29/24 [phone]

## 2024-01-28 NOTE — ED Triage Notes (Signed)
 Family too pt to family doctor with c/o pt generalized weakness, nausea and "mood changes." Pt denies vomiting or diarrhea. Pt reports she has not been eating much lately. Pt was seen at [facility] ED on 2/3 with similar complaints.

## 2024-01-28 NOTE — Patient Instructions (Signed)
 [facility] - Dr. Carrolyn Clan [address] [place], [place] [address] [phone]
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
## 2024-01-29 DIAGNOSIS — D509 Iron deficiency anemia, unspecified: Secondary | ICD-10-CM

## 2024-01-29 DIAGNOSIS — G43909 Migraine, unspecified, not intractable, without status migrainosus: Secondary | ICD-10-CM | POA: Diagnosis present

## 2024-01-29 DIAGNOSIS — Z7901 Long term (current) use of anticoagulants: Secondary | ICD-10-CM | POA: Diagnosis not present

## 2024-01-29 DIAGNOSIS — I959 Hypotension, unspecified: Secondary | ICD-10-CM | POA: Diagnosis present

## 2024-01-29 DIAGNOSIS — G9341 Metabolic encephalopathy: Secondary | ICD-10-CM | POA: Diagnosis present

## 2024-01-29 DIAGNOSIS — I950 Idiopathic hypotension: Secondary | ICD-10-CM | POA: Diagnosis not present

## 2024-01-29 DIAGNOSIS — E872 Acidosis, unspecified: Secondary | ICD-10-CM | POA: Diagnosis present

## 2024-01-29 DIAGNOSIS — I5021 Acute systolic (congestive) heart failure: Secondary | ICD-10-CM | POA: Diagnosis not present

## 2024-01-29 DIAGNOSIS — K760 Fatty (change of) liver, not elsewhere classified: Secondary | ICD-10-CM | POA: Diagnosis present

## 2024-01-29 DIAGNOSIS — I482 Chronic atrial fibrillation, unspecified: Secondary | ICD-10-CM | POA: Diagnosis present

## 2024-01-29 DIAGNOSIS — N1832 Chronic kidney disease, stage 3b: Secondary | ICD-10-CM | POA: Diagnosis present

## 2024-01-29 DIAGNOSIS — I9589 Other hypotension: Secondary | ICD-10-CM | POA: Diagnosis not present

## 2024-01-29 DIAGNOSIS — E861 Hypovolemia: Secondary | ICD-10-CM

## 2024-01-29 DIAGNOSIS — I130 Hypertensive heart and chronic kidney disease with heart failure and stage 1 through stage 4 chronic kidney disease, or unspecified chronic kidney disease: Secondary | ICD-10-CM | POA: Diagnosis present

## 2024-01-29 DIAGNOSIS — I340 Nonrheumatic mitral (valve) insufficiency: Secondary | ICD-10-CM | POA: Diagnosis present

## 2024-01-29 DIAGNOSIS — R1110 Vomiting, unspecified: Secondary | ICD-10-CM | POA: Diagnosis present

## 2024-01-29 DIAGNOSIS — R4182 Altered mental status, unspecified: Secondary | ICD-10-CM | POA: Diagnosis not present

## 2024-01-29 DIAGNOSIS — R1111 Vomiting without nausea: Secondary | ICD-10-CM

## 2024-01-29 DIAGNOSIS — J9601 Acute respiratory failure with hypoxia: Secondary | ICD-10-CM | POA: Diagnosis present

## 2024-01-29 DIAGNOSIS — I2510 Atherosclerotic heart disease of native coronary artery without angina pectoris: Secondary | ICD-10-CM | POA: Diagnosis present

## 2024-01-29 DIAGNOSIS — C8592 Non-Hodgkin lymphoma, unspecified, intrathoracic lymph nodes: Secondary | ICD-10-CM | POA: Diagnosis present

## 2024-01-29 DIAGNOSIS — E782 Mixed hyperlipidemia: Secondary | ICD-10-CM | POA: Diagnosis present

## 2024-01-29 DIAGNOSIS — D45 Polycythemia vera: Secondary | ICD-10-CM | POA: Diagnosis present

## 2024-01-29 DIAGNOSIS — Z66 Do not resuscitate: Secondary | ICD-10-CM | POA: Diagnosis present

## 2024-01-29 DIAGNOSIS — D72829 Elevated white blood cell count, unspecified: Secondary | ICD-10-CM

## 2024-01-29 DIAGNOSIS — I10 Essential (primary) hypertension: Secondary | ICD-10-CM

## 2024-01-29 DIAGNOSIS — Z89611 Acquired absence of right leg above knee: Secondary | ICD-10-CM | POA: Diagnosis not present

## 2024-01-29 DIAGNOSIS — D72823 Leukemoid reaction: Secondary | ICD-10-CM | POA: Diagnosis not present

## 2024-01-29 DIAGNOSIS — I708 Atherosclerosis of other arteries: Secondary | ICD-10-CM | POA: Diagnosis present

## 2024-01-29 DIAGNOSIS — Z1152 Encounter for screening for COVID-19: Secondary | ICD-10-CM | POA: Diagnosis not present

## 2024-01-29 DIAGNOSIS — J479 Bronchiectasis, uncomplicated: Secondary | ICD-10-CM | POA: Diagnosis present

## 2024-01-29 DIAGNOSIS — I480 Paroxysmal atrial fibrillation: Secondary | ICD-10-CM | POA: Diagnosis present

## 2024-01-29 DIAGNOSIS — I5033 Acute on chronic diastolic (congestive) heart failure: Secondary | ICD-10-CM | POA: Diagnosis present

## 2024-01-29 DIAGNOSIS — I272 Pulmonary hypertension, unspecified: Secondary | ICD-10-CM | POA: Diagnosis present

## 2024-01-29 DIAGNOSIS — R400 Somnolence: Secondary | ICD-10-CM

## 2024-01-29 HISTORY — DX: Acute respiratory failure with hypoxia: J96.01

## 2024-01-29 LAB — CBC
HCT: 42.3 % (ref 36.0–46.0)
Hemoglobin: 11.7 g/dL — ABNORMAL LOW (ref 12.0–15.0)
MCH: 20.1 pg — ABNORMAL LOW (ref 26.0–34.0)
MCHC: 27.7 g/dL — ABNORMAL LOW (ref 30.0–36.0)
MCV: 72.7 fL — ABNORMAL LOW (ref 80.0–100.0)
Platelets: 333 10*3/uL (ref 150–400)
RBC: 5.82 MIL/uL — ABNORMAL HIGH (ref 3.87–5.11)
RDW: 22.5 % — ABNORMAL HIGH (ref 11.5–15.5)
WBC: 28.6 10*3/uL — ABNORMAL HIGH (ref 4.0–10.5)
nRBC: 0.5 % — ABNORMAL HIGH (ref 0.0–0.2)

## 2024-01-29 LAB — COMPREHENSIVE METABOLIC PANEL
ALT: 11 U/L (ref 0–44)
AST: 18 U/L (ref 15–41)
Albumin: 2.7 g/dL — ABNORMAL LOW (ref 3.5–5.0)
Alkaline Phosphatase: 84 U/L (ref 38–126)
Anion gap: 11 (ref 5–15)
BUN: 31 mg/dL — ABNORMAL HIGH (ref 8–23)
CO2: 16 mmol/L — ABNORMAL LOW (ref 22–32)
Calcium: 8.2 mg/dL — ABNORMAL LOW (ref 8.9–10.3)
Chloride: 109 mmol/L (ref 98–111)
Creatinine, Ser: 1.35 mg/dL — ABNORMAL HIGH (ref 0.44–1.00)
GFR, Estimated: 42 mL/min — ABNORMAL LOW (ref >60)
Glucose, Bld: 82 mg/dL (ref 70–99)
Potassium: 4.1 mmol/L (ref 3.5–5.1)
Sodium: 136 mmol/L (ref 135–145)
Total Bilirubin: 0.7 mg/dL (ref 0.0–1.2)
Total Protein: 5.6 g/dL — ABNORMAL LOW (ref 6.5–8.1)

## 2024-01-29 LAB — PHOSPHORUS: Phosphorus: 4.5 mg/dL (ref 2.5–4.6)

## 2024-01-29 LAB — IRON AND TIBC
Iron: 15 ug/dL — ABNORMAL LOW (ref 28–170)
Saturation Ratios: 5 % — ABNORMAL LOW (ref 10.4–31.8)
TIBC: 332 ug/dL (ref 250–450)
UIBC: 317 ug/dL

## 2024-01-29 LAB — FERRITIN: Ferritin: 17 ng/mL (ref 11–307)

## 2024-01-29 LAB — CORTISOL-AM, BLOOD: Cortisol - AM: 17 ug/dL (ref 6.7–22.6)

## 2024-01-29 LAB — MRSA NEXT GEN BY PCR, NASAL: MRSA by PCR Next Gen: NOT DETECTED

## 2024-01-29 LAB — RAPID URINE DRUG SCREEN, HOSP PERFORMED
Amphetamines: NOT DETECTED
Barbiturates: NOT DETECTED
Benzodiazepines: NOT DETECTED
Cocaine: NOT DETECTED
Opiates: POSITIVE — AB
Tetrahydrocannabinol: NOT DETECTED

## 2024-01-29 LAB — MAGNESIUM: Magnesium: 1.6 mg/dL — ABNORMAL LOW (ref 1.7–2.4)

## 2024-01-29 MED ORDER — IPRATROPIUM-ALBUTEROL 0.5-2.5 (3) MG/3ML IN SOLN
3.0000 mL | Freq: Four times a day (QID) | RESPIRATORY_TRACT | Status: DC
  Administered 2024-01-29 – 2024-01-31 (×7): 3 mL via RESPIRATORY_TRACT
  Filled 2024-01-29 (×7): qty 3

## 2024-01-29 MED ORDER — NOREPINEPHRINE 4 MG/250ML-% IV SOLN: 2.0000 ug/min | INTRAVENOUS | Status: DC

## 2024-01-29 MED ORDER — CHLORHEXIDINE GLUCONATE CLOTH 2 % EX PADS
6.0000 | MEDICATED_PAD | Freq: Every day | CUTANEOUS | Status: DC
  Administered 2024-01-29 – 2024-02-01 (×5): 6 via TOPICAL

## 2024-01-29 MED ORDER — EZETIMIBE 10 MG PO TABS
10.0000 mg | ORAL_TABLET | Freq: Every day | ORAL | Status: DC
  Administered 2024-01-29 – 2024-02-02 (×5): 10 mg via ORAL
  Filled 2024-01-29 (×5): qty 1

## 2024-01-29 MED ORDER — SODIUM CHLORIDE 0.9 % IV SOLN
INTRAVENOUS | Status: AC
Start: 2024-01-29 — End: 2024-01-29

## 2024-01-29 MED ORDER — MAGNESIUM SULFATE 4 GM/100ML IV SOLN
4.0000 g | Freq: Once | INTRAVENOUS | Status: AC
  Administered 2024-01-29: 4 g via INTRAVENOUS
  Filled 2024-01-29: qty 100

## 2024-01-29 MED ORDER — ALBUTEROL SULFATE (2.5 MG/3ML) 0.083% IN NEBU: 2.5000 mg | INHALATION_SOLUTION | RESPIRATORY_TRACT | Status: DC | PRN

## 2024-01-29 MED ORDER — SODIUM CHLORIDE 0.9 % IV SOLN: INTRAVENOUS | Status: AC

## 2024-01-29 MED ORDER — SODIUM CHLORIDE 0.9 % IV SOLN: 250.0000 mL | INTRAVENOUS | Status: AC

## 2024-01-29 MED ORDER — DM-GUAIFENESIN ER 30-600 MG PO TB12
1.0000 | ORAL_TABLET | Freq: Two times a day (BID) | ORAL | Status: DC
  Administered 2024-01-29 – 2024-02-02 (×9): 1 via ORAL
  Filled 2024-01-29 (×9): qty 1

## 2024-01-29 MED ORDER — DOXYCYCLINE HYCLATE 100 MG PO TABS
100.0000 mg | ORAL_TABLET | Freq: Two times a day (BID) | ORAL | Status: DC
  Administered 2024-01-29 – 2024-02-02 (×9): 100 mg via ORAL
  Filled 2024-01-29 (×9): qty 1

## 2024-01-29 MED ORDER — ONDANSETRON HCL 4 MG PO TABS
4.0000 mg | ORAL_TABLET | Freq: Four times a day (QID) | ORAL | Status: DC | PRN
  Administered 2024-01-30: 4 mg via ORAL
  Filled 2024-01-29: qty 1

## 2024-01-29 MED ORDER — SODIUM CHLORIDE 0.9 % IV SOLN
2.0000 g | INTRAVENOUS | Status: DC
  Administered 2024-01-29 – 2024-02-02 (×5): 2 g via INTRAVENOUS
  Filled 2024-01-29 (×5): qty 20

## 2024-01-29 MED ORDER — MIDODRINE HCL 5 MG PO TABS
10.0000 mg | ORAL_TABLET | Freq: Three times a day (TID) | ORAL | Status: DC
  Administered 2024-01-29 – 2024-01-30 (×4): 10 mg via ORAL
  Filled 2024-01-29 (×4): qty 2

## 2024-01-29 MED ORDER — ENOXAPARIN SODIUM 30 MG/0.3ML IJ SOSY
30.0000 mg | PREFILLED_SYRINGE | INTRAMUSCULAR | Status: DC
  Administered 2024-01-29 – 2024-01-30 (×2): 30 mg via SUBCUTANEOUS
  Filled 2024-01-29 (×2): qty 0.3

## 2024-01-29 MED ORDER — ONDANSETRON HCL 4 MG/2ML IJ SOLN
4.0000 mg | Freq: Four times a day (QID) | INTRAMUSCULAR | Status: DC | PRN
Start: 2024-01-29 — End: 2024-02-02

## 2024-01-29 NOTE — ED Notes (Signed)
 Informed attending (adefeso) and RT of pt spo2 decreasing to 81% on room air. Placed pt on 3l, repositioned in bed, reading 92%. Pt denies shortness of breath.

## 2024-01-29 NOTE — ED Notes (Signed)
 Pt refused hospital gown.

## 2024-01-29 NOTE — Progress Notes (Addendum)
  Patient seen and evaluated, chart reviewed, please see EMR for updated orders. Please see full H&P dictated by admitting physician Dr Adefeso for same date of service.   Brief Summary:- 72 y.o. female with medical history significant of hypertension, hyperlipidemia, CAD, PVD with right AKA, chronic atrial fibrillation, polycythemia vera, prior stroke and MI admitted on 01/29/24 with acute hypoxic Resp Failure and hypotension with concerns for altered mental status in the setting of recurrent emesis  A/p 1) acute hypoxic respiratory failure-- -weaned down to 2 L of oxygen via nasal cannula at this time -chest x-ray with possible pneumonia -COVID, flu and RSV negative -Leukocytosis noted but this is not new patient has history of persistent significant leukocytosis with WBC usually over 30k from time to time  2)Hypotension--- a.m. cortisol is 17 (Not low) -UDS with opiates -UA is not consistent with a UTI -IV fluids and midodrine  as ordered -Echo requested -Ok to Use low-dose pressors if required to keep MAP above 60  3)CKD stage -3B    -Renal function appears stable, close to baseline at this time  renally adjust medications, avoid nephrotoxic agents / dehydration  / hypotension  4) acute anemia--- hemoglobin down to 11.7 baseline usually between 12 and 13 -Workup consistent with iron  deficiency with serum iron  of 15 and saturation of 5 and ferritin of 17 -No bleeding concerns -Monitor closely and transfuse as clinically indicated  5) acute metabolic encephalopathy--due to #1 and #2 above -Mentation has improved significantly  6)Vomiting--- no further emesis for now - advance diet as tolerated -As needed antiemetics  -Total care time 42 minutes  Rendall Carwin, MD

## 2024-01-29 NOTE — ED Notes (Signed)
 Lab verified they can add drug screen to test.

## 2024-01-29 NOTE — Progress Notes (Signed)
   01/29/24 1552  TOC Brief Assessment  Insurance and Status Reviewed  Patient has primary care physician Yes  Home environment has been reviewed From home  Prior level of function: Independent, family assistance  Prior/Current Home Services No current home services  Readmission risk has been reviewed Yes  Transition of care needs transition of care needs identified, TOC will continue to follow

## 2024-01-29 NOTE — H&P (Addendum)
 History and Physical    Patient: [name] FMW:[id number] DOB: 1951-06-10 DOA: 01/28/2024 DOS: the patient was seen and examined on 01/29/2024 PCP: Cathlene Marry Lenis, FNP  Patient coming from: Home  Chief Complaint:  Chief Complaint  Patient presents with   Weakness   Nausea   HPI: [name] is a 73 y.o. female with medical history significant of hypertension, hyperlipidemia, CAD, PVD with right AKA, chronic atrial fibrillation, polycythemia vera, prior stroke and MI who presents to the emergency department due to 3-week onset of increased confusion.  Patient complained of history of migraine headache which she does not have at this time.  Most of the history was obtained from daughter at bedside, per report, patient has had increased agitation and sometimes does not recognize family members.  She also complained of numbness on the body without being able to localize part of the body that is known.  It was unknown if patient was taking her medication correctly since she manages her medication.  Patient was apparently seen in the ED on 01/21/2024 for migraine, CT of head done at that time was negative and she was treated with Compazine  and Benadryl  for migraine with resolution.  She denies current headache at this time, no recent falls was reported.  ED Course:  In the emergency department, she was hemodynamically on arrival to the ED, but subsequently, BP was in the hypotensive range at 81/63, other vital signs are within normal range.  O2 sat was also normal at 93% on arrival to the ED, but this subsequently decreased to 89%, supplemental oxygen via [place] was provided with improved oxygenation to 95-100%.  Workup in the ED showed WBC of 34.8, hematocrit 46.8, hemoglobin 12.9, MCV 72.7, platelets 356.  BMP was normal except for bicarb of 16, blood glucose 101, BUN to creatinine 30/1.54 (baseline creatinine at 1.3-1.6). Troponin 35 > 45, urinalysis was positive for proteinuria.  Lactic acid  was normal.  Influenza A, B, SARS, respiratory, RSV was negative. Chest x-ray showed mild pulmonary vascular congestion with associated interstitial edema.  Mild bi-basilar atelectasis and/or infiltrate. Tylenol  was given, IV LR 1 L was provided.  Hospitalist was asked to admit patient for further evaluation and management.  Review of Systems: Review of systems as noted in the HPI. All other systems reviewed and are negative.   Past Medical History:  Diagnosis Date   Acute blood loss anemia 05/04/2020   Arthritis    fingers, some in my knees (01/28/2016)   CAD (coronary artery disease), per cath 04/27/13, non obstructive disease, EF 65-70% 05/11/2013   Carotid disease, bilateral (HCC) 09/20/2013   Lt carotid 50-69% stentosis, less on rt   Cellulitis of left leg 01/16/2022   Dysrhythmia    Elevated troponin 05/04/2020   Encounter for support and coordination of transition of care 11/11/2021   Fatty liver    GERD (gastroesophageal reflux disease)    Glaucoma    H/O cardiovascular stress test 12/27/2009   negative for ischemia   Headache    occasional since eye pressure regulated (01/28/2016)   Hospital discharge follow-up 10/15/2021   Hyperlipidemia    Hypertension    Hypertensive emergency 11/28/2022   Leukocytosis 07/15/2015   Migraine    stopped w/laser holes to relieve pressure in my eyes; had them 3-4 times/wk before taht (01/28/2016)   Mitral regurgitation,2+ by cath 05/11/2013   NSTEMI (non-ST elevated myocardial infarction) (HCC) 04/27/2013   Pain    BOTH KNEES - PT HAS TORN MENISCUS  LEFT KNEE   Pain in both wrists 09/03/2021   Pain in left knee 10/03/2018   Paroxysmal atrial fibrillation (HCC)    hx of a fib on ASA  AND ELIQUIS     Polycythemia vera (HCC) 08/20/2015   JAK-2 positive 07/19/15   Prosthesis adjustment 01/13/2019   PVD (peripheral vascular disease) with claudication (HCC) 07/21/2006   previous L ext iliac stent and rt SFA stent, known occluded Lt SFA    S/P cardiac cath 04/27/2013   NON OBSTRUCTIVE DISEASE, 2+ mr, ef 65-70%   Statin intolerance    Stroke (HCC) 12/21/2004   SWELLING ALL OVER AND RT SIDE OF FACE DRAWN AND SPEECH SLURRED AND NUMBNESS ON RIGHT SIDE-- ALL RESOLVED   TIA (transient ischemic attack)    Past Surgical History:  Procedure Laterality Date   AMPUTATION Right 05/21/2017   Procedure: AMPUTATION ABOVE KNEE;  Surgeon: Oris Krystal FALCON, MD;  Location: [facility] OR;  Service: Vascular;  Laterality: Right;   CARDIAC CATHETERIZATION  04/27/13   non occlusive disease with mild to mod. calcified lesions in ostial LM and proximal LAD and moderate prox. RC AND DISTAL AV GROOVE LCX, ef   CATARACT EXTRACTION W/PHACO Right 03/21/2015   Procedure: CATARACT EXTRACTION PHACO AND INTRAOCULAR LENS PLACEMENT RIGHT EYE;  Surgeon: Lynwood Hermann, MD;  Location: [facility] ORS;  Service: Ophthalmology;  Laterality: Right;  CDE:5.10   CATARACT EXTRACTION W/PHACO Left 05/16/2015   Procedure: CATARACT EXTRACTION PHACO AND INTRAOCULAR LENS PLACEMENT (IOC);  Surgeon: Lynwood Hermann, MD;  Location: [facility] ORS;  Service: Ophthalmology;  Laterality: Left;  CDE 5.57   CESAREAN SECTION  1977   COLONOSCOPY N/A 09/11/2016   Procedure: COLONOSCOPY;  Surgeon: Claudis RAYMOND Rivet, MD;  Location: [facility] SUITE;  Service: Endoscopy;  Laterality: N/A;  730-moved to 1:00 Ann notified pt   EMBOLECTOMY Right 02/01/2016   Procedure: Thrombectomy of Right Femoral-Popliteal Bypass Graft; Endarterectomy of Right Below Knee Popliteal Artery and Tibial Peroneal Trunk with Bovine Pericardium Patch Angioplasty.;  Surgeon: Lonni GORMAN Blade, MD;  Location: [facility] OR;  Service: Vascular;  Laterality: Right;   FEMORAL-POPLITEAL BYPASS GRAFT Right 01/31/2016   Procedure: BYPASS GRAFT RIGHT COMMON  FEMORALTO BELOW KNEE POPLITEAL ARTERY BYPASS GRAFT USING PROPATEN GORTEX GRAFT;  Surgeon: Lynwood JONETTA Collum, MD;  Location: [facility] OR;  Service: Vascular;  Laterality: Right;   FEMORAL-POPLITEAL BYPASS GRAFT Right  01/09/2017   Procedure: THROMBECTOMY OF RIGHT FEMORAL-POPLITEAL  ARTERY BYPASS GRAFT; THROMBECTOMY RIGHT  TIBIAL VESSELS;  Surgeon: Carlin FORBES Haddock, MD;  Location: [facility] OR;  Service: Vascular;  Laterality: Right;   FEMORAL-POPLITEAL BYPASS GRAFT Right 05/17/2017   Procedure: RIGHT FEMORAL-TIBIAL PERONEAL TRUNK ARTERY BYPASS GRAFT USING 21mmX80cm PROPATEN GRAFT WITH REMOVABLE RING;  Surgeon: Oris Krystal FALCON, MD;  Location: [facility] OR;  Service: Vascular;  Laterality: Right;   FRACTURE SURGERY     ILIAC ARTERY STENT  07/2006   external iliac and Rt SFA stent 07/2006 AND THE LEFT WAS IN 2006   KNEE ARTHROSCOPY WITH MEDIAL MENISECTOMY Left 03/27/2014   Procedure: left knee arthorscopy with medial chondraplasty of the medial femoral and patella, medial microfracture technique of medial femoral condyl;  Surgeon: Tanda DELENA Heading, MD;  Location: [facility] ORS;  Service: Orthopedics;  Laterality: Left;   LEFT HEART CATHETERIZATION WITH CORONARY ANGIOGRAM Right 04/27/2013   Procedure: LEFT HEART CATHETERIZATION WITH CORONARY ANGIOGRAM;  Surgeon: Alm LELON Clay, MD;  Location: [facility] CATH LAB;  Service: Cardiovascular;  Laterality: Right;   LOWER EXTREMITY ANGIOGRAM Right 01/31/2016   Procedure:  INTRAOP RIGHT LOWER EXTREMITY ANGIOGRAM;  Surgeon: Lynwood JONETTA Collum, MD;  Location: [facility] OR;  Service: Vascular;  Laterality: Right;   ORIF WRIST FRACTURE Right 1983   OVARIAN CYST REMOVAL Right    PATCH ANGIOPLASTY Right 01/31/2016   Procedure: RIGHT COMMON FEMORAL AND PROFUNDA FEMORIS ENDARECTOMY WITH PATCH ANGIOPLASTY;  Surgeon: Lynwood JONETTA Collum, MD;  Location: [facility] OR;  Service: Vascular;  Laterality: Right;   PATCH ANGIOPLASTY Right 01/09/2017   Procedure: PATCH ANGIOPLASTY RIGHT POPLITEAL ARTERY BYPASS GRAFT;  Surgeon: Carlin FORBES Haddock, MD;  Location: [facility] OR;  Service: Vascular;  Laterality: Right;   PERIPHERAL VASCULAR CATHETERIZATION N/A 06/27/2015   Procedure: Lower Extremity Angiography;  Surgeon: Dorn JINNY Lesches, MD;  Location: [facility] INVASIVE CV  LAB;  Service: Cardiovascular;  Laterality: N/A;   PERIPHERAL VASCULAR CATHETERIZATION Bilateral 01/29/2016   Procedure: Lower Extremity Angiography;  Surgeon: Deatrice DELENA Cage, MD;  Location: [facility] INVASIVE CV LAB;  Service: Cardiovascular;  Laterality: Bilateral;   PERIPHERAL VASCULAR CATHETERIZATION N/A 01/29/2016   Procedure: Abdominal Aortogram;  Surgeon: Deatrice DELENA Cage, MD;  Location: [facility] INVASIVE CV LAB;  Service: Cardiovascular;  Laterality: N/A;   REFRACTIVE SURGERY Bilateral    6 in one eye; 7 in the other; to relieve pressure; not glaucoma (01/28/2016)   SFA Right 06/27/2015   overlapping Bahn covered stents   TUBAL LIGATION  1983   VEIN HARVEST Left 05/17/2017   Procedure: LEFT LEG GREATER SAPHENOUS VEIN HARVEST;  Surgeon: Oris Krystal FALCON, MD;  Location: [facility] OR;  Service: Vascular;  Laterality: Left;   VEIN REPAIR Right 01/31/2016   Procedure: RIGHT GREATER SAPHENOUS VEIN EXAMNED BUT NOT REMOVED;  Surgeon: Lynwood JONETTA Collum, MD;  Location: [facility] OR;  Service: Vascular;  Laterality: Right;    Social History:  reports that she quit smoking about 11 years ago. Her smoking use included cigarettes. She started smoking about 56 years ago. She has a 45 pack-year smoking history. She has never used smokeless tobacco. She reports that she does not drink alcohol  and does not use drugs.   Allergies  Allergen Reactions   Atenolol Rash and Itching   Demerol   [Meperidine  Hcl] Itching   Demerol  [Meperidine ] Other (See Comments) and Itching    Unknown, can't remember  Unknown, can't remember    Gabapentin  Anxiety and Other (See Comments)    SI and HI   Inderal [Propranolol] Other (See Comments) and Itching    Doesn't remember    Prednisone Other (See Comments)    Muscle spasms   Statins Itching and Other (See Comments)    Simvastatin -caused severe itching Pravastatin -caused lesser tiching One type of statin? Caused stroke in 2006, and other symptoms as result   Warfarin And Related Other (See  Comments)    Caused nose bleeds   Crestor [Rosuvastatin] Itching and Rash   Docosahexaenoic Acid (Dha) (Fish)     Other reaction(s): Other (See Comments) nosebleeds   Lactose Intolerance (Gi) Other (See Comments)    Bloating, gas   Lipitor [Atorvastatin ] Rash   Lovaza [Omega-3-Acid Ethyl Esters (Fish)] Other (See Comments)    nosebleeds    Penicillins Hives, Rash and Other (See Comments)    Has patient had a PCN reaction causing immediate rash, facial/tongue/throat swelling, SOB or lightheadedness with hypotension: Yes Has patient had a PCN reaction causing severe rash involving mucus membranes or skin necrosis: No Has patient had a PCN reaction that required hospitalization: No Has patient had a PCN reaction occurring within the last 10 years: No If  all of the above answers are NO, then may proceed with Cephalosporin use.  Questionable high fever    Pravastatin  Itching and Rash   Simvastatin  Itching, Rash and Other (See Comments)   Trilipix [Choline Fenofibrate ] Other (See Comments)    Doesn't remember    Warfarin Other (See Comments)    nosebleeds   Hydralazine  Swelling   Effexor [Venlafaxine Hcl] Other (See Comments)    Doesn't remember    Nexium [Esomeprazole Magnesium ] Rash   Percocet [Oxycodone -Acetaminophen ] Other (See Comments)    Doesn't remember    Plavix  [Clopidogrel  Bisulfate] Rash    Family History  Problem Relation Age of Onset   CAD Mother    Lung cancer Mother    Heart attack Mother    CAD Father    Heart disease Father    Heart attack Father 69       died in hes 19's with heart disease   Healthy Sister    Liver cancer Brother    Healthy Sister    CAD Brother        has had CABG   CAD Brother      Prior to Admission medications   Medication Sig Start Date End Date Taking? Authorizing Provider  acetaminophen  (TYLENOL ) 500 MG tablet Take 2 tablets (1,000 mg total) by mouth every 6 (six) hours. 09/16/18   Vicci Burnard SAUNDERS, PA-C  apixaban  (ELIQUIS )  2.5 MG TABS tablet Take 1 tablet (2.5 mg total) by mouth 2 (two) times daily. 01/13/24   West, Katlyn D, NP  ARTIFICIAL TEAR OP Place 1 drop into both eyes as needed (for dry eyes).    [provider]  aspirin  EC 325 MG tablet Take 325 mg by mouth daily.    [provider]  diclofenac  Sodium (VOLTAREN ) 1 % GEL Apply 2 g topically 4 (four) times daily. Patient taking differently: Apply 2 g topically 4 (four) times daily as needed (pain). 09/03/21   Ijaola, Onyeje M, NP  diltiazem  (CARDIZEM  CD) 240 MG 24 hr capsule Take 1 capsule (240 mg total) by mouth daily. 01/13/24   West, Katlyn D, NP  escitalopram  (LEXAPRO ) 20 MG tablet TAKE ONE TABLET ONCE DAILY. NEEDS TO BE SEEN FOR REFILLS 08/09/23   Cathlene Marry Lenis, FNP  ezetimibe  (ZETIA ) 10 MG tablet Take 1 tablet (10 mg total) by mouth daily. 01/13/24   West, Katlyn D, NP  fenofibrate  (TRICOR ) 145 MG tablet Take 1 tablet (145 mg total) by mouth daily. 01/13/24   West, Katlyn D, NP  flecainide  (TAMBOCOR ) 50 MG tablet TAKE ONE TABLET TWICE DAILY 01/27/24   Milian, Marry Lenis, FNP  omeprazole  (PRILOSEC) 20 MG capsule TAKE ONE CAPSULE TWICE DAILY BEFORE MEALS 01/27/24   Milian, Marry Lenis, FNP  triamcinolone  cream (KENALOG ) 0.1 % Apply 1 Application topically 2 (two) times daily. 02/09/23   Severa Rock HERO, FNP  valsartan  (DIOVAN ) 160 MG tablet TAKE ONE TABLET EVERY EVENING (WITH 320MG  IN THE MORNING) 12/07/23   Court Dorn PARAS, MD  valsartan  (DIOVAN ) 320 MG tablet TAKE ONE TABLET EVERY MORNING (WITH 160MG  EVERY EVENING) 12/07/23   Court Dorn PARAS, MD    Physical Exam: BP 102/68   Pulse 90   Temp 98 F (36.7 C) (Oral)   Resp 18   Ht 5' 4 (1.626 m)   Wt 54 kg   SpO2 98%   BMI 20.43 kg/m   General: 73 y.o. year-old female well developed well nourished in no acute distress.  Alert and oriented x3.  HEENT: NCAT, EOMI, dry mucous membrane Neck: Supple, trachea medial Cardiovascular: Regular rate and rhythm with no rubs or  gallops.  No thyromegaly or JVD noted.  No lower extremity edema. 2/4 pulses in all 4 extremities. Respiratory: Clear to auscultation with no wheezes or rales. Good inspiratory effort. Abdomen: Soft, nontender nondistended with normal bowel sounds x4 quadrants. Muskuloskeletal: Leg abrasions noted on left leg.  No cyanosis, clubbing or edema noted bilaterally Neuro: CN II-XII intact, strength 5/5 x 4, sensation, reflexes intact Skin: Left leg abrasion noted, decreased skin turgor Psychiatry: Mood is appropriate for condition and setting          Labs on Admission:  Basic Metabolic Panel: Recent Labs  Lab 01/24/24 2310 01/28/24 1827  NA 136 136  K 4.8 4.5  CL 111 109  CO2 16* 16*  GLUCOSE 93 101*  BUN 24* 30*  CREATININE 1.38* 1.54*  CALCIUM  8.3* 8.4*   Liver Function Tests: Recent Labs  Lab 01/28/24 1827  AST 20  ALT 12  ALKPHOS 90  BILITOT 0.8  PROT 6.1*  ALBUMIN  2.9*   No results for input(s): LIPASE, AMYLASE in the last 168 hours. No results for input(s): AMMONIA in the last 168 hours. CBC: Recent Labs  Lab 01/24/24 2310 01/28/24 1827  WBC 38.7* 34.8*  NEUTROABS  --  30.2*  HGB 13.1 12.9  HCT 46.8* 46.8*  MCV 71.5* 72.7*  PLT 458* 356   Cardiac Enzymes: No results for input(s): CKTOTAL, CKMB, CKMBINDEX, TROPONINI in the last 168 hours.  BNP (last 3 results) Recent Labs    06/23/23 1205  BNP 462.8*    ProBNP (last 3 results) No results for input(s): PROBNP in the last 8760 hours.  CBG: Recent Labs  Lab 01/28/24 1852 01/28/24 1922  GLUCAP 80 70    Radiological Exams on Admission: DG Chest 2 View Result Date: 01/28/2024 CLINICAL DATA:  Generalized weakness and mood changes. EXAM: CHEST - 2 VIEW COMPARISON:  November 27, 2022 FINDINGS: The heart size and mediastinal contours are within normal limits. There is marked severity calcification of the aortic arch. There is prominence of the central pulmonary vasculature with mild  diffusely increased interstitial lung markings. Mild to moderate severity areas of superimposed atelectasis and/or infiltrate are seen within the bilateral lung bases. No pleural effusion or pneumothorax is identified. Multilevel degenerative changes are seen throughout the thoracic spine. IMPRESSION: 1. Mild pulmonary vascular congestion with associated interstitial edema. 2. Mild bibasilar atelectasis and/or infiltrate. Electronically Signed   By: Suzen Dials M.D.   On: 01/28/2024 19:49    EKG: I independently viewed the EKG done and my findings are as followed: Normal sinus rhythm at rate of 83 bpm with APCs  Assessment/Plan Present on Admission:  Hypotension  Leukocytosis  Stage 3b chronic kidney disease (HCC)  Essential hypertension  Mixed hyperlipidemia  Microcytic anemia  Principal Problem:   Hypotension Active Problems:   Mixed hyperlipidemia   Leukocytosis   Essential hypertension   Microcytic anemia   Stage 3b chronic kidney disease (HCC)   Altered mental status, unspecified   Acute respiratory failure with hypoxia (HCC)   Vomiting  Hypotension Continue IV hydration Orthostatic vital signs will be done in the morning  Reported altered mental status Patient was alert and oriented x 3 Continue fall precaution Continue to monitor patient for altered mental status and treat accordingly  Vomiting Continue Zofran  as needed  Acute respiratory failure with hypoxia Chest x-ray showed  mild pulmonary vascular congestion with associated  interstitial edema and mild bibasilar atelectasis and/or infiltrate.  Lasix  will be temporarily held at this time due to soft BP Echocardiogram will be done in the morning Continue incentive spirometry and flutter valve Continue supplemental oxygen to maintain O2 sat > 92% and wean patient off this as tolerated (patient does not use oxygen at baseline)  Leukocytosis possibly reactive WBC 24.8, no acute suspicion of an infectious  process at this time Continue to monitor WBC with morning labs  Microcytic anemia MCV 72.7, iron  studies will be done  CKD 3B BUN to creatinine 30/1.54 (baseline creatinine at 1.3-1.6).  Renally adjust medications, avoid nephrotoxic agents/dehydration/hypotension  Essential hypertension BP meds will be held at this time due to soft BP  Mixed hyperlipidemia Continue Zetia    DVT prophylaxis: Lovenox   Code Status: DNR  Family Communication: Granddaughter at bedside (all questions answered to satisfaction)  Consults: None   Severity of Illness: The appropriate patient status for this patient is OBSERVATION. Observation status is judged to be reasonable and necessary in order to provide the required intensity of service to ensure the patient's safety. The patient's presenting symptoms, physical exam findings, and initial radiographic and laboratory data in the context of their medical condition is felt to place them at decreased risk for further clinical deterioration. Furthermore, it is anticipated that the patient will be medically stable for discharge from the hospital within 2 midnights of admission.   Author: Warden Buffa, DO 01/29/2024 2:17 AM  For on call review [email or web address].

## 2024-01-29 NOTE — ED Notes (Signed)
 ED TO INPATIENT HANDOFF REPORT  ED Nurse Name and Phone #: Ajai Terhaar  S Name/Age/Gender [name] 73 y.o. female Room/Bed: APA04/APA04  Code Status   Code Status: Limited: Do not attempt resuscitation (DNR) -DNR-LIMITED -Do Not Intubate/DNI   Home/SNF/Other Home Patient oriented to: self, place, time, and situation Is this baseline? Yes   Triage Complete: Triage complete  Chief Complaint Hypotension [I95.9] Symptomatic hypotension [I95.9]  Triage Note Family too pt to family doctor with c/o pt generalized weakness, nausea and mood changes. Pt denies vomiting or diarrhea. Pt reports she has not been eating much lately. Pt was seen at [facility] ED on 2/3 with similar complaints.    Allergies Allergies  Allergen Reactions   Atenolol Rash and Itching   Demerol   [Meperidine  Hcl] Itching   Demerol  [Meperidine ] Other (See Comments) and Itching    Unknown, can't remember  Unknown, can't remember    Gabapentin  Anxiety and Other (See Comments)    SI and HI   Inderal [Propranolol] Other (See Comments) and Itching    Doesn't remember    Prednisone Other (See Comments)    Muscle spasms   Statins Itching and Other (See Comments)    Simvastatin -caused severe itching Pravastatin -caused lesser tiching One type of statin? Caused stroke in 2006, and other symptoms as result   Warfarin And Related Other (See Comments)    Caused nose bleeds   Crestor [Rosuvastatin] Itching and Rash   Docosahexaenoic Acid (Dha) (Fish)     Other reaction(s): Other (See Comments) nosebleeds   Lactose Intolerance (Gi) Other (See Comments)    Bloating, gas   Lipitor [Atorvastatin ] Rash   Lovaza [Omega-3-Acid Ethyl Esters (Fish)] Other (See Comments)    nosebleeds    Penicillins Hives, Rash and Other (See Comments)    Has patient had a PCN reaction causing immediate rash, facial/tongue/throat swelling, SOB or lightheadedness with hypotension: Yes Has patient had a PCN reaction causing severe rash  involving mucus membranes or skin necrosis: No Has patient had a PCN reaction that required hospitalization: No Has patient had a PCN reaction occurring within the last 10 years: No If all of the above answers are NO, then may proceed with Cephalosporin use.  Questionable high fever    Pravastatin  Itching and Rash   Simvastatin  Itching, Rash and Other (See Comments)   Trilipix [Choline Fenofibrate ] Other (See Comments)    Doesn't remember    Warfarin Other (See Comments)    nosebleeds   Hydralazine  Swelling   Effexor [Venlafaxine Hcl] Other (See Comments)    Doesn't remember    Nexium [Esomeprazole Magnesium ] Rash   Percocet [Oxycodone -Acetaminophen ] Other (See Comments)    Doesn't remember    Plavix  [Clopidogrel  Bisulfate] Rash    Level of Care/Admitting Diagnosis ED Disposition     ED Disposition  Admit   Condition  --   Comment  Hospital Area: [facility] [[id number]]  Level of Care: Stepdown [14]  Covid Evaluation: Asymptomatic - no recent exposure (last 10 days) testing not required  Diagnosis: Symptomatic hypotension [[id number]]  Admitting Physician: PEARLEAN RENDALL NEARING  Attending Physician: PEARLEAN RENDALL NEARING  Certification:: I certify this patient will need inpatient services for at least 2 midnights  Expected Medical Readiness: 01/31/2024          B Medical/Surgery History Past Medical History:  Diagnosis Date   Acute blood loss anemia 05/04/2020   Arthritis    fingers, some in my knees (01/28/2016)   CAD (coronary artery disease), per cath 04/27/13,  non obstructive disease, EF 65-70% 05/11/2013   Carotid disease, bilateral (HCC) 09/20/2013   Lt carotid 50-69% stentosis, less on rt   Cellulitis of left leg 01/16/2022   Dysrhythmia    Elevated troponin 05/04/2020   Encounter for support and coordination of transition of care 11/11/2021   Fatty liver    GERD (gastroesophageal reflux disease)    Glaucoma    H/O cardiovascular stress test  12/27/2009   negative for ischemia   Headache    occasional since eye pressure regulated (01/28/2016)   Hospital discharge follow-up 10/15/2021   Hyperlipidemia    Hypertension    Hypertensive emergency 11/28/2022   Leukocytosis 07/15/2015   Migraine    stopped w/laser holes to relieve pressure in my eyes; had them 3-4 times/wk before taht (01/28/2016)   Mitral regurgitation,2+ by cath 05/11/2013   NSTEMI (non-ST elevated myocardial infarction) (HCC) 04/27/2013   Pain    BOTH KNEES - PT HAS TORN MENISCUS LEFT KNEE   Pain in both wrists 09/03/2021   Pain in left knee 10/03/2018   Paroxysmal atrial fibrillation (HCC)    hx of a fib on ASA  AND ELIQUIS     Polycythemia vera (HCC) 08/20/2015   JAK-2 positive 07/19/15   Prosthesis adjustment 01/13/2019   PVD (peripheral vascular disease) with claudication (HCC) 07/21/2006   previous L ext iliac stent and rt SFA stent, known occluded Lt SFA   S/P cardiac cath 04/27/2013   NON OBSTRUCTIVE DISEASE, 2+ mr, ef 65-70%   Statin intolerance    Stroke (HCC) 12/21/2004   SWELLING ALL OVER AND RT SIDE OF FACE DRAWN AND SPEECH SLURRED AND NUMBNESS ON RIGHT SIDE-- ALL RESOLVED   TIA (transient ischemic attack)    Past Surgical History:  Procedure Laterality Date   AMPUTATION Right 05/21/2017   Procedure: AMPUTATION ABOVE KNEE;  Surgeon: Oris Krystal FALCON, MD;  [facility] OR;  Service: Vascular;  Laterality: Right;   CARDIAC CATHETERIZATION  04/27/13   non occlusive disease with mild to mod. calcified lesions in ostial LM and proximal LAD and moderate prox. RC AND DISTAL AV GROOVE LCX, ef   CATARACT EXTRACTION W/PHACO Right 03/21/2015   Procedure: CATARACT EXTRACTION PHACO AND INTRAOCULAR LENS PLACEMENT RIGHT EYE;  Surgeon: Lynwood Hermann, MD;  Location: [facility] ORS;  Service: Ophthalmology;  Laterality: Right;  CDE:5.10   CATARACT EXTRACTION W/PHACO Left 05/16/2015   Procedure: CATARACT EXTRACTION PHACO AND INTRAOCULAR LENS PLACEMENT (IOC);  Surgeon: Lynwood Hermann, MD;  Location: [facility] ORS;  Service: Ophthalmology;  Laterality: Left;  CDE 5.57   CESAREAN SECTION  1977   COLONOSCOPY N/A 09/11/2016   Procedure: COLONOSCOPY;  Surgeon: Claudis RAYMOND Rivet, MD;  Location: [facility] ENDO SUITE;  Service: Endoscopy;  Laterality: N/A;  730-moved to 1:00 Ann notified pt   EMBOLECTOMY Right 02/01/2016   Procedure: Thrombectomy of Right Femoral-Popliteal Bypass Graft; Endarterectomy of Right Below Knee Popliteal Artery and Tibial Peroneal Trunk with Bovine Pericardium Patch Angioplasty.;  Surgeon: Lonni GORMAN Blade, MD;  Location: [facility] OR;  Service: Vascular;  Laterality: Right;   FEMORAL-POPLITEAL BYPASS GRAFT Right 01/31/2016   Procedure: BYPASS GRAFT RIGHT COMMON  FEMORALTO BELOW KNEE POPLITEAL ARTERY BYPASS GRAFT USING PROPATEN GORTEX GRAFT;  Surgeon: Lynwood JONETTA Collum, MD;  Location: [facility] OR;  Service: Vascular;  Laterality: Right;   FEMORAL-POPLITEAL BYPASS GRAFT Right 01/09/2017   Procedure: THROMBECTOMY OF RIGHT FEMORAL-POPLITEAL  ARTERY BYPASS GRAFT; THROMBECTOMY RIGHT  TIBIAL VESSELS;  Surgeon: Carlin FORBES Haddock, MD;  Location: [facility] OR;  Service: Vascular;  Laterality: Right;   FEMORAL-POPLITEAL BYPASS GRAFT Right 05/17/2017   Procedure: RIGHT FEMORAL-TIBIAL PERONEAL TRUNK ARTERY BYPASS GRAFT USING 21mmX80cm PROPATEN GRAFT WITH REMOVABLE RING;  Surgeon: Oris Krystal FALCON, MD;  [facility] OR;  Service: Vascular;  Laterality: Right;   FRACTURE SURGERY     ILIAC ARTERY STENT  07/2006   external iliac and Rt SFA stent 07/2006 AND THE LEFT WAS IN 2006   KNEE ARTHROSCOPY WITH MEDIAL MENISECTOMY Left 03/27/2014   Procedure: left knee arthorscopy with medial chondraplasty of the medial femoral and patella, medial microfracture technique of medial femoral condyl;  Surgeon: Tanda DELENA Heading, MD;  Location: [facility] ORS;  Service: Orthopedics;  Laterality: Left;   LEFT HEART CATHETERIZATION WITH CORONARY ANGIOGRAM Right 04/27/2013   Procedure: LEFT HEART CATHETERIZATION WITH CORONARY ANGIOGRAM;   Surgeon: Alm LELON Clay, MD;  Location: [facility] CATH LAB;  Service: Cardiovascular;  Laterality: Right;   LOWER EXTREMITY ANGIOGRAM Right 01/31/2016   Procedure: INTRAOP RIGHT LOWER EXTREMITY ANGIOGRAM;  Surgeon: Lynwood JONETTA Collum, MD;  Location: [facility] OR;  Service: Vascular;  Laterality: Right;   ORIF WRIST FRACTURE Right 1983   OVARIAN CYST REMOVAL Right    PATCH ANGIOPLASTY Right 01/31/2016   Procedure: RIGHT COMMON FEMORAL AND PROFUNDA FEMORIS ENDARECTOMY WITH PATCH ANGIOPLASTY;  Surgeon: Lynwood JONETTA Collum, MD;  Location: [facility] OR;  Service: Vascular;  Laterality: Right;   PATCH ANGIOPLASTY Right 01/09/2017   Procedure: PATCH ANGIOPLASTY RIGHT POPLITEAL ARTERY BYPASS GRAFT;  Surgeon: Carlin FORBES Haddock, MD;  Location: [facility] OR;  Service: Vascular;  Laterality: Right;   PERIPHERAL VASCULAR CATHETERIZATION N/A 06/27/2015   Procedure: Lower Extremity Angiography;  Surgeon: Dorn JINNY Lesches, MD;  [facility] INVASIVE CV LAB;  Service: Cardiovascular;  Laterality: N/A;   PERIPHERAL VASCULAR CATHETERIZATION Bilateral 01/29/2016   Procedure: Lower Extremity Angiography;  Surgeon: Deatrice DELENA Cage, MD;  [facility] INVASIVE CV LAB;  Service: Cardiovascular;  Laterality: Bilateral;   PERIPHERAL VASCULAR CATHETERIZATION N/A 01/29/2016   Procedure: Abdominal Aortogram;  Surgeon: Deatrice DELENA Cage, MD;  [facility] INVASIVE CV LAB;  Service: Cardiovascular;  Laterality: N/A;   REFRACTIVE SURGERY Bilateral    6 in one eye; 7 in the other; to relieve pressure; not glaucoma (01/28/2016)   SFA Right 06/27/2015   overlapping Bahn covered stents   TUBAL LIGATION  1983   VEIN HARVEST Left 05/17/2017   Procedure: LEFT LEG GREATER SAPHENOUS VEIN HARVEST;  Surgeon: Oris Krystal FALCON, MD;  [facility] OR;  Service: Vascular;  Laterality: Left;   VEIN REPAIR Right 01/31/2016   Procedure: RIGHT GREATER SAPHENOUS VEIN EXAMNED BUT NOT REMOVED;  Surgeon: Lynwood JONETTA Collum, MD;  Location: [facility] OR;  Service: Vascular;  Laterality: Right;     A IV  Location/Drains/Wounds Patient Lines/Drains/Airways Status     Active Line/Drains/Airways     Name Placement date Placement time Site Days   Peripheral IV 01/28/24 22 G Anterior;Right Forearm 01/28/24  2315  Forearm  1            Intake/Output Last 24 hours No intake or output data in the 24 hours ending 01/29/24 1124  Labs/Imaging Results for orders placed or performed during the hospital encounter of 01/28/24 (from the past 48 hours)  Resp panel by RT-PCR (RSV, Flu A&B, Covid) Anterior Nasal Swab     Status: None   Collection Time: 01/28/24  6:17 PM   Specimen: Anterior Nasal Swab  Result Value Ref Range   SARS Coronavirus 2 by RT PCR NEGATIVE NEGATIVE  Comment: (NOTE) SARS-CoV-2 target nucleic acids are NOT DETECTED.  The SARS-CoV-2 RNA is generally detectable in upper respiratory specimens during the acute phase of infection. The lowest concentration of SARS-CoV-2 viral copies this assay can detect is 138 copies/mL. A negative result does not preclude SARS-Cov-2 infection and should not be used as the sole basis for treatment or other patient management decisions. A negative result may occur with  improper specimen collection/handling, submission of specimen other than nasopharyngeal swab, presence of viral mutation(s) within the areas targeted by this assay, and inadequate number of viral copies(<138 copies/mL). A negative result must be combined with clinical observations, patient history, and epidemiological information. The expected result is Negative.  Fact Sheet for Patients:  bloggercourse.com  Fact Sheet for Healthcare Providers:  seriousbroker.it  This test is no t yet approved or cleared by the [place]  FDA and  has been authorized for detection and/or diagnosis of SARS-CoV-2 by FDA under an Emergency Use Authorization (EUA). This EUA will remain  in effect (meaning this test can be used) for the  duration of the COVID-19 declaration under Section 564(b)(1) of the Act, 21 U.S.C.section 360bbb-3(b)(1), unless the authorization is terminated  or revoked sooner.       Influenza A by PCR NEGATIVE NEGATIVE   Influenza B by PCR NEGATIVE NEGATIVE    Comment: (NOTE) The Xpert Xpress SARS-CoV-2/FLU/RSV plus assay is intended as an aid in the diagnosis of influenza from Nasopharyngeal swab specimens and should not be used as a sole basis for treatment. Nasal washings and aspirates are unacceptable for Xpert Xpress SARS-CoV-2/FLU/RSV testing.  Fact Sheet for Patients: bloggercourse.com  Fact Sheet for Healthcare Providers: seriousbroker.it  This test is not yet approved or cleared by the [place]  FDA and has been authorized for detection and/or diagnosis of SARS-CoV-2 by FDA under an Emergency Use Authorization (EUA). This EUA will remain in effect (meaning this test can be used) for the duration of the COVID-19 declaration under Section 564(b)(1) of the Act, 21 U.S.C. section 360bbb-3(b)(1), unless the authorization is terminated or revoked.     Resp Syncytial Virus by PCR NEGATIVE NEGATIVE    Comment: (NOTE) Fact Sheet for Patients: bloggercourse.com  Fact Sheet for Healthcare Providers: seriousbroker.it  This test is not yet approved or cleared by the [place]  FDA and has been authorized for detection and/or diagnosis of SARS-CoV-2 by FDA under an Emergency Use Authorization (EUA). This EUA will remain in effect (meaning this test can be used) for the duration of the COVID-19 declaration under Section 564(b)(1) of the Act, 21 U.S.C. section 360bbb-3(b)(1), unless the authorization is terminated or revoked.  Performed at [facility], [address] [place], [place] [address]   CBC with Differential     Status: Abnormal   Collection Time: 01/28/24  6:27 PM   Result Value Ref Range   WBC 34.8 (H) 4.0 - 10.5 K/uL   RBC 6.44 (H) 3.87 - 5.11 MIL/uL   Hemoglobin 12.9 12.0 - 15.0 g/dL   HCT 53.1 (H) 63.9 - 53.9 %   MCV 72.7 (L) 80.0 - 100.0 fL   MCH 20.0 (L) 26.0 - 34.0 pg   MCHC 27.6 (L) 30.0 - 36.0 g/dL   RDW 77.1 (H) 88.4 - 84.4 %   Platelets 356 150 - 400 K/uL   nRBC 0.4 (H) 0.0 - 0.2 %   Neutrophils Relative % 87 %   Neutro Abs 30.2 (H) 1.7 - 7.7 K/uL   Lymphocytes Relative 3 %  Lymphs Abs 1.1 0.7 - 4.0 K/uL   Monocytes Relative 2 %   Monocytes Absolute 0.8 0.1 - 1.0 K/uL   Eosinophils Relative 1 %   Eosinophils Absolute 0.4 0.0 - 0.5 K/uL   Basophils Relative 2 %   Basophils Absolute 0.5 (H) 0.0 - 0.1 K/uL   WBC Morphology Abnormal lymphocytes present    Immature Granulocytes 5 %   Abs Immature Granulocytes 1.79 (H) 0.00 - 0.07 K/uL    Comment: Performed at [facility], [address] [place], [place] [address]  Comprehensive metabolic panel     Status: Abnormal   Collection Time: 01/28/24  6:27 PM  Result Value Ref Range   Sodium 136 135 - 145 mmol/L   Potassium 4.5 3.5 - 5.1 mmol/L   Chloride 109 98 - 111 mmol/L   CO2 16 (L) 22 - 32 mmol/L   Glucose, Bld 101 (H) 70 - 99 mg/dL    Comment: Glucose reference range applies only to samples taken after fasting for at least 8 hours.   BUN 30 (H) 8 - 23 mg/dL   Creatinine, Ser 8.45 (H) 0.44 - 1.00 mg/dL   Calcium  8.4 (L) 8.9 - 10.3 mg/dL   Total Protein 6.1 (L) 6.5 - 8.1 g/dL   Albumin  2.9 (L) 3.5 - 5.0 g/dL   AST 20 15 - 41 U/L   ALT 12 0 - 44 U/L   Alkaline Phosphatase 90 38 - 126 U/L   Total Bilirubin 0.8 0.0 - 1.2 mg/dL   GFR, Estimated 36 (L) >60 mL/min    Comment: (NOTE) Calculated using the CKD-EPI Creatinine Equation (2021)    Anion gap 11 5 - 15    Comment: Performed at [facility], [address] [name], [place] [address]  Lactic acid, plasma     Status: None   Collection Time: 01/28/24  6:27 PM  Result Value Ref Range   Lactic Acid, Venous 0.7 0.5 - 1.9  mmol/L    Comment: Performed at [facility], [address] [place], [place] [address]  Troponin I (High Sensitivity)     Status: Abnormal   Collection Time: 01/28/24  6:27 PM  Result Value Ref Range   Troponin I (High Sensitivity) 35 (H) <18 ng/L    Comment: (NOTE) Elevated high sensitivity troponin I (hsTnI) values and significant  changes across serial measurements may suggest ACS but many other  chronic and acute conditions are known to elevate hsTnI results.  Refer to the Links section for chest pain algorithms and additional  guidance. Performed at [facility], [address], [place], [place] [address]   CBG monitoring, ED     Status: None   Collection Time: 01/28/24  6:52 PM  Result Value Ref Range   Glucose-Capillary 80 70 - 99 mg/dL    Comment: Glucose reference range applies only to samples taken after fasting for at least 8 hours.  CBG monitoring, ED     Status: None   Collection Time: 01/28/24  7:22 PM  Result Value Ref Range   Glucose-Capillary 70 70 - 99 mg/dL    Comment: Glucose reference range applies only to samples taken after fasting for at least 8 hours.  Blood culture (routine x 2)     Status: None (Preliminary result)   Collection Time: 01/28/24  7:52 PM   Specimen: Left Antecubital; Blood  Result Value Ref Range   Specimen Description LEFT ANTECUBITAL    Special Requests NONE    Culture  NO GROWTH < 12 HOURS Performed at [facility], [address] [place], [place] [address]    Report Status PENDING   Blood culture (routine x 2)     Status: None (Preliminary result)   Collection Time: 01/28/24  7:58 PM   Specimen: BLOOD LEFT FOREARM  Result Value Ref Range   Specimen Description BLOOD LEFT FOREARM    Special Requests NONE    Culture      NO GROWTH < 12 HOURS Performed at [facility], [address] [place], [place] [address]    Report Status PENDING   Troponin I (High Sensitivity)     Status: Abnormal   Collection Time: 01/28/24  8:08 PM   Result Value Ref Range   Troponin I (High Sensitivity) 45 (H) <18 ng/L    Comment: (NOTE) Elevated high sensitivity troponin I (hsTnI) values and significant  changes across serial measurements may suggest ACS but many other  chronic and acute conditions are known to elevate hsTnI results.  Refer to the Links section for chest pain algorithms and additional  guidance. Performed at [facility], [address] [place], [place] [address]   Urinalysis, Routine w reflex microscopic -Urine, Clean Catch     Status: Abnormal   Collection Time: 01/28/24  9:31 PM  Result Value Ref Range   Color, Urine YELLOW YELLOW   APPearance CLEAR CLEAR   Specific Gravity, Urine 1.020 1.005 - 1.030   pH 5.0 5.0 - 8.0   Glucose, UA 150 (A) NEGATIVE mg/dL   Hgb urine dipstick SMALL (A) NEGATIVE   Bilirubin Urine NEGATIVE NEGATIVE   Ketones, ur NEGATIVE NEGATIVE mg/dL   Protein, ur >=699 (A) NEGATIVE mg/dL   Nitrite NEGATIVE NEGATIVE   Leukocytes,Ua NEGATIVE NEGATIVE   RBC / HPF 0-5 0 - 5 RBC/hpf   WBC, UA 0-5 0 - 5 WBC/hpf   Bacteria, UA NONE SEEN NONE SEEN   Squamous Epithelial / HPF 0-5 0 - 5 /HPF   Mucus PRESENT     Comment: Performed at [facility], [address] [place], [place] [address]  Ferritin     Status: None   Collection Time: 01/29/24  3:26 AM  Result Value Ref Range   Ferritin 17 11 - 307 ng/mL    Comment: Performed at [facility], [address] [place], [place] [address]  Iron  and TIBC     Status: Abnormal   Collection Time: 01/29/24  3:26 AM  Result Value Ref Range   Iron  15 (L) 28 - 170 ug/dL   TIBC 667 749 - 549 ug/dL   Saturation Ratios 5 (L) 10.4 - 31.8 %   UIBC 317 ug/dL    Comment: Performed at [facility], [address], [place], [place] [address]  Comprehensive metabolic panel     Status: Abnormal   Collection Time: 01/29/24  3:26 AM  Result Value Ref Range   Sodium 136 135 - 145 mmol/L   Potassium 4.1 3.5 - 5.1 mmol/L   Chloride 109 98 - 111 mmol/L   CO2 16  (L) 22 - 32 mmol/L   Glucose, Bld 82 70 - 99 mg/dL    Comment: Glucose reference range applies only to samples taken after fasting for at least 8 hours.   BUN 31 (H) 8 - 23 mg/dL   Creatinine, Ser 8.64 (H) 0.44 - 1.00 mg/dL   Calcium  8.2 (L) 8.9 - 10.3 mg/dL   Total Protein 5.6 (L) 6.5 - 8.1 g/dL   Albumin  2.7 (L)  3.5 - 5.0 g/dL   AST 18 15 - 41 U/L   ALT 11 0 - 44 U/L   Alkaline Phosphatase 84 38 - 126 U/L   Total Bilirubin 0.7 0.0 - 1.2 mg/dL   GFR, Estimated 42 (L) >60 mL/min    Comment: (NOTE) Calculated using the CKD-EPI Creatinine Equation (2021)    Anion gap 11 5 - 15    Comment: Performed at [facility], [address], [place], [place] [address]  CBC     Status: Abnormal   Collection Time: 01/29/24  3:26 AM  Result Value Ref Range   WBC 28.6 (H) 4.0 - 10.5 K/uL   RBC 5.82 (H) 3.87 - 5.11 MIL/uL   Hemoglobin 11.7 (L) 12.0 - 15.0 g/dL   HCT 57.6 63.9 - 53.9 %   MCV 72.7 (L) 80.0 - 100.0 fL   MCH 20.1 (L) 26.0 - 34.0 pg   MCHC 27.7 (L) 30.0 - 36.0 g/dL   RDW 77.4 (H) 88.4 - 84.4 %   Platelets 333 150 - 400 K/uL   nRBC 0.5 (H) 0.0 - 0.2 %    Comment: Performed at [facility], [address] [place], [place] [address]  Magnesium      Status: Abnormal   Collection Time: 01/29/24  3:26 AM  Result Value Ref Range   Magnesium  1.6 (L) 1.7 - 2.4 mg/dL    Comment: Performed at [facility], [address], [place], [place] [address]  Phosphorus     Status: None   Collection Time: 01/29/24  3:26 AM  Result Value Ref Range   Phosphorus 4.5 2.5 - 4.6 mg/dL    Comment: Performed at [facility], [address], [place], [place] [address]   DG Chest 2 View Result Date: 01/28/2024 CLINICAL DATA:  Generalized weakness and mood changes. EXAM: CHEST - 2 VIEW COMPARISON:  November 27, 2022 FINDINGS: The heart size and mediastinal contours are within normal limits. There is marked severity calcification of the aortic arch. There is prominence of the central pulmonary vasculature with mild  diffusely increased interstitial lung markings. Mild to moderate severity areas of superimposed atelectasis and/or infiltrate are seen within the bilateral lung bases. No pleural effusion or pneumothorax is identified. Multilevel degenerative changes are seen throughout the thoracic spine. IMPRESSION: 1. Mild pulmonary vascular congestion with associated interstitial edema. 2. Mild bibasilar atelectasis and/or infiltrate. Electronically Signed   By: Suzen Dials M.D.   On: 01/28/2024 19:49    Pending Labs Unresulted Labs (From admission, onward)     Start     Ordered   02/05/24 0500  Creatinine, serum  (enoxaparin  (LOVENOX )    CrCl < 30 ml/min)  Once,   R       Comments: while on enoxaparin  therapy.    01/29/24 0157   01/30/24 0500  CBC  Tomorrow morning,   R        01/29/24 0958   01/30/24 0500  Basic metabolic panel  Tomorrow morning,   R        01/29/24 0958   01/29/24 0814  Cortisol-am, blood  ONCE - URGENT,   URGENT       Question:  Specimen collection method  Answer:  Lab=Lab collect   01/29/24 0815   01/29/24 0810  Rapid urine drug screen (hospital performed)  Add-on,   AD        01/29/24 0809            Vitals/Pain Today's Vitals   01/29/24 1100  01/29/24 1101 01/29/24 1112 01/29/24 1115  BP: (!) 78/60   (!) 81/65  Pulse: 96   (!) 107  Resp: 16   (!) 21  Temp:      TempSrc:      SpO2: 94% 95%  96%  Weight:      Height:      PainSc:   0-No pain     Isolation Precautions No active isolations  Medications Medications  0.9 %  sodium chloride  infusion (0 mLs Intravenous Stopped 01/29/24 0837)  ezetimibe  (ZETIA ) tablet 10 mg (has no administration in time range)  enoxaparin  (LOVENOX ) injection 30 mg (30 mg Subcutaneous Given 01/29/24 0921)  ondansetron  (ZOFRAN ) tablet 4 mg (has no administration in time range)    Or  ondansetron  (ZOFRAN ) injection 4 mg (has no administration in time range)  0.9 %  sodium chloride  infusion (0 mLs Intravenous Stopped 01/29/24 1105)   cefTRIAXone  (ROCEPHIN ) 2 g in sodium chloride  0.9 % 100 mL IVPB (2 g Intravenous New Bag/Given 01/29/24 1109)  doxycycline  (VIBRA -TABS) tablet 100 mg (100 mg Oral Given 01/29/24 1109)  midodrine  (PROAMATINE ) tablet 10 mg (10 mg Oral Given 01/29/24 1109)  dextromethorphan -guaiFENesin  (MUCINEX  DM) 30-600 MG per 12 hr tablet 1 tablet (1 tablet Oral Given 01/29/24 1109)  albuterol  (PROVENTIL ) (2.5 MG/3ML) 0.083% nebulizer solution 2.5 mg (has no administration in time range)  ipratropium-albuterol  (DUONEB) 0.5-2.5 (3) MG/3ML nebulizer solution 3 mL (3 mLs Nebulization Given 01/29/24 1101)  lactated ringers  bolus 1,000 mL (0 mLs Intravenous Stopped 01/28/24 2024)  acetaminophen  (TYLENOL ) tablet 650 mg (650 mg Oral Given 01/28/24 2310)  magnesium  sulfate IVPB 4 g 100 mL (0 g Intravenous Stopped 01/29/24 1111)    Mobility walks with device     Focused Assessments Diminished lung sound  , WBC high,    R Recommendations: See Admitting Provider Note  Report given to:   Additional Notes:  Pt normally on RA now on 3lpm. W/C to bathroom, continent,  hypotensive , Afib, Pills whole, amputee

## 2024-01-29 NOTE — ED Notes (Signed)
Attending aware of bp. 

## 2024-01-29 NOTE — Plan of Care (Signed)
  Problem: Clinical Measurements: Goal: Respiratory complications will improve Outcome: Progressing   Problem: Clinical Measurements: Goal: Cardiovascular complication will be avoided Outcome: Progressing   Problem: Clinical Measurements: Goal: Will remain free from infection Outcome: Progressing   Problem: Clinical Measurements: Goal: Ability to maintain clinical measurements within normal limits will improve Outcome: Progressing

## 2024-01-30 ENCOUNTER — Inpatient Hospital Stay (HOSPITAL_COMMUNITY): Payer: 59

## 2024-01-30 DIAGNOSIS — I5021 Acute systolic (congestive) heart failure: Secondary | ICD-10-CM

## 2024-01-30 DIAGNOSIS — D72829 Elevated white blood cell count, unspecified: Secondary | ICD-10-CM | POA: Diagnosis not present

## 2024-01-30 DIAGNOSIS — I959 Hypotension, unspecified: Secondary | ICD-10-CM | POA: Diagnosis not present

## 2024-01-30 DIAGNOSIS — J9601 Acute respiratory failure with hypoxia: Secondary | ICD-10-CM | POA: Diagnosis not present

## 2024-01-30 DIAGNOSIS — N1832 Chronic kidney disease, stage 3b: Secondary | ICD-10-CM | POA: Diagnosis not present

## 2024-01-30 LAB — ECHOCARDIOGRAM COMPLETE
AR max vel: 2.6 cm2
AV Area VTI: 2.26 cm2
AV Area mean vel: 2.06 cm2
AV Mean grad: 5 mm[Hg]
AV Peak grad: 7.6 mm[Hg]
Ao pk vel: 1.38 m/s
Area-P 1/2: 2.62 cm2
Height: 65 in
MV VTI: 1.5 cm2
S' Lateral: 2.5 cm
Weight: 1696.66 [oz_av]

## 2024-01-30 LAB — BASIC METABOLIC PANEL
Anion gap: 10 (ref 5–15)
BUN: 26 mg/dL — ABNORMAL HIGH (ref 8–23)
CO2: 16 mmol/L — ABNORMAL LOW (ref 22–32)
Calcium: 7.9 mg/dL — ABNORMAL LOW (ref 8.9–10.3)
Chloride: 109 mmol/L (ref 98–111)
Creatinine, Ser: 1.15 mg/dL — ABNORMAL HIGH (ref 0.44–1.00)
GFR, Estimated: 51 mL/min — ABNORMAL LOW (ref >60)
Glucose, Bld: 108 mg/dL — ABNORMAL HIGH (ref 70–99)
Potassium: 3.8 mmol/L (ref 3.5–5.1)
Sodium: 135 mmol/L (ref 135–145)

## 2024-01-30 LAB — CBC
HCT: 40.5 % (ref 36.0–46.0)
Hemoglobin: 11.3 g/dL — ABNORMAL LOW (ref 12.0–15.0)
MCH: 19.9 pg — ABNORMAL LOW (ref 26.0–34.0)
MCHC: 27.9 g/dL — ABNORMAL LOW (ref 30.0–36.0)
MCV: 71.3 fL — ABNORMAL LOW (ref 80.0–100.0)
Platelets: 329 10*3/uL (ref 150–400)
RBC: 5.68 MIL/uL — ABNORMAL HIGH (ref 3.87–5.11)
RDW: 22.2 % — ABNORMAL HIGH (ref 11.5–15.5)
WBC: 28.7 10*3/uL — ABNORMAL HIGH (ref 4.0–10.5)
nRBC: 0.4 % — ABNORMAL HIGH (ref 0.0–0.2)

## 2024-01-30 MED ORDER — IRBESARTAN 150 MG PO TABS
300.0000 mg | ORAL_TABLET | Freq: Every day | ORAL | Status: DC
  Administered 2024-01-30 – 2024-02-02 (×4): 300 mg via ORAL
  Filled 2024-01-30 (×5): qty 2

## 2024-01-30 MED ORDER — ESCITALOPRAM OXALATE 10 MG PO TABS
20.0000 mg | ORAL_TABLET | Freq: Every day | ORAL | Status: DC
  Administered 2024-01-30 – 2024-02-02 (×4): 20 mg via ORAL
  Filled 2024-01-30 (×5): qty 2

## 2024-01-30 MED ORDER — DAPAGLIFLOZIN PROPANEDIOL 5 MG PO TABS
5.0000 mg | ORAL_TABLET | Freq: Every day | ORAL | Status: DC
  Administered 2024-01-31 – 2024-02-02 (×3): 5 mg via ORAL
  Filled 2024-01-30 (×4): qty 1

## 2024-01-30 MED ORDER — LABETALOL HCL 5 MG/ML IV SOLN
10.0000 mg | INTRAVENOUS | Status: DC | PRN
  Administered 2024-01-30 – 2024-02-01 (×7): 10 mg via INTRAVENOUS
  Filled 2024-01-30 (×6): qty 4

## 2024-01-30 MED ORDER — SODIUM CHLORIDE 0.45 % IV SOLN
INTRAVENOUS | Status: AC
  Filled 2024-01-30 (×3): qty 75

## 2024-01-30 MED ORDER — IOHEXOL 350 MG/ML SOLN
75.0000 mL | Freq: Once | INTRAVENOUS | Status: AC | PRN
  Administered 2024-01-30: 75 mL via INTRAVENOUS

## 2024-01-30 MED ORDER — FENOFIBRATE 160 MG PO TABS
160.0000 mg | ORAL_TABLET | Freq: Every day | ORAL | Status: DC
  Administered 2024-01-31 – 2024-02-02 (×3): 160 mg via ORAL
  Filled 2024-01-30 (×3): qty 1

## 2024-01-30 MED ORDER — AMLODIPINE BESYLATE 5 MG PO TABS
10.0000 mg | ORAL_TABLET | Freq: Every day | ORAL | Status: DC
  Administered 2024-01-31 – 2024-02-02 (×3): 10 mg via ORAL
  Filled 2024-01-30 (×3): qty 2

## 2024-01-30 MED ORDER — APIXABAN 2.5 MG PO TABS
2.5000 mg | ORAL_TABLET | Freq: Two times a day (BID) | ORAL | Status: DC
  Administered 2024-01-30 – 2024-02-02 (×6): 2.5 mg via ORAL
  Filled 2024-01-30 (×6): qty 1

## 2024-01-30 MED ORDER — PANTOPRAZOLE SODIUM 40 MG PO TBEC
40.0000 mg | DELAYED_RELEASE_TABLET | Freq: Every day | ORAL | Status: DC
  Administered 2024-01-30 – 2024-02-02 (×4): 40 mg via ORAL
  Filled 2024-01-30 (×5): qty 1

## 2024-01-30 MED ORDER — ASPIRIN 81 MG PO TBEC
81.0000 mg | DELAYED_RELEASE_TABLET | Freq: Every day | ORAL | Status: DC
  Administered 2024-01-31 – 2024-02-02 (×3): 81 mg via ORAL
  Filled 2024-01-30 (×3): qty 1

## 2024-01-30 MED ORDER — AMLODIPINE BESYLATE 5 MG PO TABS
5.0000 mg | ORAL_TABLET | Freq: Every day | ORAL | Status: DC
  Administered 2024-01-30: 5 mg via ORAL
  Filled 2024-01-30: qty 1

## 2024-01-30 MED ORDER — AMLODIPINE BESYLATE 5 MG PO TABS
5.0000 mg | ORAL_TABLET | Freq: Once | ORAL | Status: AC
  Administered 2024-01-30: 5 mg via ORAL
  Filled 2024-01-30: qty 1

## 2024-01-30 MED ORDER — FLECAINIDE ACETATE 50 MG PO TABS
50.0000 mg | ORAL_TABLET | Freq: Two times a day (BID) | ORAL | Status: DC
  Administered 2024-01-30 – 2024-02-02 (×6): 50 mg via ORAL
  Filled 2024-01-30 (×8): qty 1

## 2024-01-30 NOTE — Progress Notes (Addendum)
 PROGRESS NOTE   [name], is a 73 y.o. female, DOB - [birth year], FMW:[id number]  Admit date - 01/28/2024   Admitting Physician Stacey Viernes Pearlean, MD  Outpatient Primary MD for the patient is Milian, Marry Lenis, FNP  LOS - 1  Chief Complaint  Patient presents with   Weakness   Nausea      Brief Summary:- 73 y.o. female with medical history significant of hypertension, hyperlipidemia, CAD, PVD with right AKA, chronic atrial fibrillation, polycythemia vera, prior stroke and MI admitted on 01/29/24 with acute hypoxic Resp Failure and hypotension with concerns for altered mental status in the setting of recurrent emesis   A/p 1) acute hypoxic respiratory failure--patient with  pulmonary hypertension -weaned down to 2 L of oxygen via nasal cannula at this time -chest x-ray with possible pneumonia--continue Rocephin  and doxycycline  -COVID, flu and RSV negative---continue bronchodilators and mucolytics -Leukocytosis noted but this is not new patient has history of persistent significant leukocytosis with WBC usually over 30k from time to time -CTA chest from 01/30/2024 without significant infectious findings   2)Hypotension--- only on the left upper extremity due to severe subclavian artery stenosis a.m. cortisol is 17 (Not low)--  On her  right arm  BP has been above systolic BP 200s,  Her left UE BP is usually very low. -With systolic blood pressure often below 90 -UDS with opiates -UA is not consistent with a UTI -IV fluids and midodrine  as ordered -Echo requested -Ok to Use low-dose pressors if required to keep MAP above 60   3)Severe Stenosis of Left Subclavian Artery--- .Discussed with vascular surgeon [name] states that findings are not new, no vascular intervention required at this time  -prior imaging study from 2022 showed Widespread atherosclerosis including a severe left subclavian artery stenosis proximal to the left vertebral artery origin with possible  subclavian steal syndrome Repeat CTA chest on 01/30/2024 shows---Severe left subclavian artery stenosis in 2022, not re-evaluated due to the contrast phase of this Exam, Severe atherosclerosis, Aortic Atherosclerosis . -Aspirin , apixaban  fenofibrate  and Zetia  as prescribed -Patient is apparently intolerant to statins -[name] recommends outpatient follow-up with vascular surgery team  4) acute anemia--- hemoglobin down to 11.7 baseline usually between 12 and 13 -Workup consistent with iron  deficiency with serum iron  of 15 and saturation of 5 and ferritin of 17 -No bleeding concerns -Monitor closely and transfuse as clinically indicated   5) acute metabolic encephalopathy--due to #1 and #2 above -Mentation has improved significantly   6)Vomiting--- no further emesis for now - advance diet as tolerated -As needed antiemetics  7)CKD stage -3B   --metabolic acidosis noted -Give IV fluids/bicarb drip for the next 24 hours or so for renal protection due to contrast study renally adjust medications, avoid nephrotoxic agents / dehydration  / hypotension  8)Possible Lymphoma--CTA chest on 01/29/2023 shows positive for Severe Splenomegaly, progressed since 2023 and in association with new enlarged mediastinal lymph nodes and trace pleural effusions. Consider Lymphoma or other Lymphoproliferative disorder. -- Outpatient follow-up advised  9)HTN--- please see #2 above -Give amlodipine  10 mg daily, every provide ordered -May use IV hydralazine  as needed  10)Social/Ethics--plan of care discussed with patient's daughter [name] and granddaughter [name] -Patient is a DNR/DNI  Status is: Inpatient   Disposition: The patient is from: Home              Anticipated d/c is to: Home              Anticipated d/c date is: 1 day  Patient currently is not medically stable to d/c. Barriers: Not Clinically Stable-   Code Status :  -  Code Status: Limited: Do not attempt resuscitation (DNR)  -DNR-LIMITED -Do Not Intubate/DNI    Family Communication:   daughter [name] and granddaughter [name]  DVT Prophylaxis  :   - SCDs   apixaban  (ELIQUIS ) tablet 2.5 mg Start: 01/30/24 2200 SCDs Start: 01/29/24 0156 --- Lovenox  discontinued   Lab Results  Component Value Date   PLT 329 01/30/2024    Inpatient Medications  Scheduled Meds:  Chlorhexidine  Gluconate Cloth  6 each Topical Q0600   dextromethorphan -guaiFENesin   1 tablet Oral BID   doxycycline   100 mg Oral Q12H   enoxaparin  (LOVENOX ) injection  30 mg Subcutaneous Q24H   ezetimibe   10 mg Oral Daily   ipratropium-albuterol   3 mL Nebulization Q6H   midodrine   10 mg Oral TID WC   Continuous Infusions:  sodium chloride  Stopped (01/29/24 1650)   cefTRIAXone  (ROCEPHIN )  IV 2 g (01/30/24 0829)   norepinephrine  (LEVOPHED ) Adult infusion Stopped (01/29/24 1651)   sodium bicarbonate  75 mEq in sodium chloride  0.45 % 1,075 mL infusion     PRN Meds:.albuterol , ondansetron  **OR** ondansetron  (ZOFRAN ) IV   Anti-infectives (From admission, onward)    Start     Dose/Rate Route Frequency Ordered Stop   01/29/24 1100  cefTRIAXone  (ROCEPHIN ) 2 g in sodium chloride  0.9 % 100 mL IVPB        2 g 200 mL/hr over 30 Minutes Intravenous Every 24 hours 01/29/24 0958     01/29/24 1100  doxycycline  (VIBRA -TABS) tablet 100 mg        100 mg Oral Every 12 hours 01/29/24 9041           Subjective: [name] today has no fevers, no emesis,  No chest pain,    daughter [name] on speaker phone and granddaughter [name] at bedside -Questions answered -Mentation improving -Severely elevated BP on the right arm noted with low BP on the left arm -No further headaches  Objective: Vitals:   01/30/24 0858 01/30/24 0900 01/30/24 0927 01/30/24 0930  BP:  (!) 65/38 (!) 217/69 (!) 196/70  Pulse:  (!) 105 93   Resp:  18 16   Temp: 97.9 F (36.6 C)     TempSrc: Oral     SpO2:  92% 92%   Weight:      Height:        Intake/Output Summary (Last 24  hours) at 01/30/2024 1134 Last data filed at 01/30/2024 0322 Gross per 24 hour  Intake 99.83 ml  Output --  Net 99.83 ml   Filed Weights   01/28/24 1652 01/29/24 1209 01/30/24 0555  Weight: 54 kg 48.1 kg 48.1 kg    Physical Exam  Gen:- Awake Alert, no acute distress, more coherent HEENT:- [place].AT, No sclera icterus Neck-Supple Neck,No JVD,.  Lungs-  CTAB , fair symmetrical air movement CV- S1, S2 normal, regular  Abd-  +ve B.Sounds, Abd Soft, No tenderness,    Extremity/Skin:- No  edema, pedal pulses present on the Lt    Psych-affect is appropriate, oriented x3 Neuro-no new focal deficits, no tremors MSK-right AKA stump Left hand with poor cap refill in fingers, left hand fingers look somewhat dusky please see photos in epic   Data Reviewed: I have personally reviewed following labs and imaging studies  CBC: Recent Labs  Lab 01/24/24 2310 01/28/24 1827 01/29/24 0326 01/30/24 0443  WBC 38.7* 34.8* 28.6* 28.7*  NEUTROABS  --  30.2*  --   --   HGB 13.1 12.9 11.7* 11.3*  HCT 46.8* 46.8* 42.3 40.5  MCV 71.5* 72.7* 72.7* 71.3*  PLT 458* 356 333 329   Basic Metabolic Panel: Recent Labs  Lab 01/24/24 2310 01/28/24 1827 01/29/24 0326 01/30/24 0443  NA 136 136 136 135  K 4.8 4.5 4.1 3.8  CL 111 109 109 109  CO2 16* 16* 16* 16*  GLUCOSE 93 101* 82 108*  BUN 24* 30* 31* 26*  CREATININE 1.38* 1.54* 1.35* 1.15*  CALCIUM  8.3* 8.4* 8.2* 7.9*  MG  --   --  1.6*  --   PHOS  --   --  4.5  --    GFR: Estimated Creatinine Clearance: 33.6 mL/min (A) (by C-G formula based on SCr of 1.15 mg/dL (H)). Liver Function Tests: Recent Labs  Lab 01/28/24 1827 01/29/24 0326  AST 20 18  ALT 12 11  ALKPHOS 90 84  BILITOT 0.8 0.7  PROT 6.1* 5.6*  ALBUMIN  2.9* 2.7*   Recent Results (from the past 240 hours)  Resp panel by RT-PCR (RSV, Flu A&B, Covid) Anterior Nasal Swab     Status: None   Collection Time: 01/28/24  6:17 PM   Specimen: Anterior Nasal Swab  Result Value Ref Range  Status   SARS Coronavirus 2 by RT PCR NEGATIVE NEGATIVE Final    Comment: (NOTE) SARS-CoV-2 target nucleic acids are NOT DETECTED.  The SARS-CoV-2 RNA is generally detectable in upper respiratory specimens during the acute phase of infection. The lowest concentration of SARS-CoV-2 viral copies this assay can detect is 138 copies/mL. A negative result does not preclude SARS-Cov-2 infection and should not be used as the sole basis for treatment or other patient management decisions. A negative result may occur with  improper specimen collection/handling, submission of specimen other than nasopharyngeal swab, presence of viral mutation(s) within the areas targeted by this assay, and inadequate number of viral copies(<138 copies/mL). A negative result must be combined with clinical observations, patient history, and epidemiological information. The expected result is Negative.  Fact Sheet for Patients:  bloggercourse.com  Fact Sheet for Healthcare Providers:  seriousbroker.it  This test is no t yet approved or cleared by the [place]  FDA and  has been authorized for detection and/or diagnosis of SARS-CoV-2 by FDA under an Emergency Use Authorization (EUA). This EUA will remain  in effect (meaning this test can be used) for the duration of the COVID-19 declaration under Section 564(b)(1) of the Act, 21 U.S.C.section 360bbb-3(b)(1), unless the authorization is terminated  or revoked sooner.       Influenza A by PCR NEGATIVE NEGATIVE Final   Influenza B by PCR NEGATIVE NEGATIVE Final    Comment: (NOTE) The Xpert Xpress SARS-CoV-2/FLU/RSV plus assay is intended as an aid in the diagnosis of influenza from Nasopharyngeal swab specimens and should not be used as a sole basis for treatment. Nasal washings and aspirates are unacceptable for Xpert Xpress SARS-CoV-2/FLU/RSV testing.  Fact Sheet for  Patients: bloggercourse.com  Fact Sheet for Healthcare Providers: seriousbroker.it  This test is not yet approved or cleared by the [place]  FDA and has been authorized for detection and/or diagnosis of SARS-CoV-2 by FDA under an Emergency Use Authorization (EUA). This EUA will remain in effect (meaning this test can be used) for the duration of the COVID-19 declaration under Section 564(b)(1) of the Act, 21 U.S.C. section 360bbb-3(b)(1), unless the authorization is terminated or revoked.     Resp Syncytial Virus by  PCR NEGATIVE NEGATIVE Final    Comment: (NOTE) Fact Sheet for Patients: bloggercourse.com  Fact Sheet for Healthcare Providers: seriousbroker.it  This test is not yet approved or cleared by the [place]  FDA and has been authorized for detection and/or diagnosis of SARS-CoV-2 by FDA under an Emergency Use Authorization (EUA). This EUA will remain in effect (meaning this test can be used) for the duration of the COVID-19 declaration under Section 564(b)(1) of the Act, 21 U.S.C. section 360bbb-3(b)(1), unless the authorization is terminated or revoked.  Performed at [facility], [address], [place], [place] [address]   Blood culture (routine x 2)     Status: None (Preliminary result)   Collection Time: 01/28/24  7:52 PM   Specimen: Left Antecubital; Blood  Result Value Ref Range Status   Specimen Description LEFT ANTECUBITAL  Final   Special Requests NONE  Final   Culture   Final    NO GROWTH 2 DAYS Performed at [facility], [address] [place], [place] [address]    Report Status PENDING  Incomplete  Blood culture (routine x 2)     Status: None (Preliminary result)   Collection Time: 01/28/24  7:58 PM   Specimen: BLOOD LEFT FOREARM  Result Value Ref Range Status   Specimen Description BLOOD LEFT FOREARM  Final   Special Requests NONE  Final    Culture   Final    NO GROWTH 2 DAYS Performed at [facility], [address], [place], [place] [address]    Report Status PENDING  Incomplete  MRSA Next Gen by PCR, Nasal     Status: None   Collection Time: 01/29/24 12:08 PM   Specimen: Nasal Mucosa; Nasal Swab  Result Value Ref Range Status   MRSA by PCR Next Gen NOT DETECTED NOT DETECTED Final    Comment: (NOTE) The GeneXpert MRSA Assay (FDA approved for NASAL specimens only), is one component of a comprehensive MRSA colonization surveillance program. It is not intended to diagnose MRSA infection nor to guide or monitor treatment for MRSA infections. Test performance is not FDA approved in patients less than 77 years old. Performed at [facility], [address], [place], [place] [address]       Radiology Studies: DG Chest 2 View Result Date: 01/28/2024 CLINICAL DATA:  Generalized weakness and mood changes. EXAM: CHEST - 2 VIEW COMPARISON:  November 27, 2022 FINDINGS: The heart size and mediastinal contours are within normal limits. There is marked severity calcification of the aortic arch. There is prominence of the central pulmonary vasculature with mild diffusely increased interstitial lung markings. Mild to moderate severity areas of superimposed atelectasis and/or infiltrate are seen within the bilateral lung bases. No pleural effusion or pneumothorax is identified. Multilevel degenerative changes are seen throughout the thoracic spine. IMPRESSION: 1. Mild pulmonary vascular congestion with associated interstitial edema. 2. Mild bibasilar atelectasis and/or infiltrate. Electronically Signed   By: Suzen Dials M.D.   On: 01/28/2024 19:49   Scheduled Meds:  Chlorhexidine  Gluconate Cloth  6 each Topical Q0600   dextromethorphan -guaiFENesin   1 tablet Oral BID   doxycycline   100 mg Oral Q12H   enoxaparin  (LOVENOX ) injection  30 mg Subcutaneous Q24H   ezetimibe   10 mg Oral Daily   ipratropium-albuterol   3 mL Nebulization Q6H    midodrine   10 mg Oral TID WC   Continuous Infusions:  sodium chloride  Stopped (01/29/24 1650)   cefTRIAXone  (ROCEPHIN )  IV 2 g (01/30/24 0829)   norepinephrine  (LEVOPHED ) Adult infusion Stopped (  01/29/24 1651)   sodium bicarbonate  75 mEq in sodium chloride  0.45 % 1,075 mL infusion       LOS: 1 day    Rendall Carwin M.D on 01/30/2024 at 11:34 AM  Go to [email or web address] - for contact info  [facility] - Office  [phone]  If 7PM-7AM, please contact night-coverage [email or web address] 01/30/2024, 11:34 AM

## 2024-01-30 NOTE — TOC Progression Note (Signed)
 Transition of Care [facility]) - Progression Note    Patient Details  Name: [name] MRN: [id number] Date of Birth: 07/10/1951  [facility]) CM/SW Contact  Lorraine LILLETTE Fenton, LCSW Phone Number: 01/30/2024, 3:14 PM  Clinical Narrative:    Pt is high risk for readmission. CSW completed assessment with pt at bedside. Pt is able to do some things independently with ADLS, she lives with her grand daughter who helps with this.  CSW met grand daughter as I was leaving assessment, introduced myself.  She  is noticeably pregnant - and was returning from a restroom break . Grand daughter reports she is 32 weeks and ready to deliver.    Pt states that she schedules medicaid transportation to appointments and had been doing this independently. Going forward, she and her grand daughter have talked, and she will assist with scheduling transportation to appointments. Regarding DME, pt has a walker, wc if needed, and a shower chair.  Pt reports not having PT/OT in home in the past, and she is not sure that she would be interested in this if recommended. TOC to continue to follow.     Expected Discharge Plan: Home/Self Care Barriers to Discharge: Continued Medical Work up  Expected Discharge Plan and Services In-house Referral: Clinical Social Work Discharge Planning Services: NA   Living arrangements for the past 2 months: Single Family Home                                       Social Determinants of Health (SDOH) Interventions SDOH Screenings   Food Insecurity: No Food Insecurity (01/29/2024)  Housing: Low Risk  (01/29/2024)  Transportation Needs: No Transportation Needs (01/29/2024)  Utilities: Not At Risk (01/29/2024)  Alcohol  Screen: Low Risk  (12/01/2023)  Depression (PHQ2-9): Low Risk  (12/01/2023)  Financial Resource Strain: Patient Declined (12/13/2023)  Physical Activity: Inactive (12/13/2023)  Social Connections: Unknown (01/29/2024)  Recent Concern: Social Connections -  Moderately Isolated (12/01/2023)  Stress: No Stress Concern Present (12/13/2023)  Tobacco Use: Medium Risk (01/28/2024)  Health Literacy: Adequate Health Literacy (12/01/2023)    Readmission Risk Interventions    01/30/2024    3:07 PM  Readmission Risk Prevention Plan  Transportation Screening Complete  PCP or Specialist Appt within 3-5 Days Complete  HRI or Home Care Consult Complete  Social Work Consult for Recovery Care Planning/Counseling Complete  Palliative Care Screening Not Applicable  Medication Review Oceanographer) Complete

## 2024-01-31 ENCOUNTER — Inpatient Hospital Stay (HOSPITAL_COMMUNITY): Payer: 59

## 2024-01-31 DIAGNOSIS — I959 Hypotension, unspecified: Secondary | ICD-10-CM | POA: Diagnosis not present

## 2024-01-31 DIAGNOSIS — I10 Essential (primary) hypertension: Secondary | ICD-10-CM | POA: Diagnosis not present

## 2024-01-31 DIAGNOSIS — J9601 Acute respiratory failure with hypoxia: Secondary | ICD-10-CM | POA: Diagnosis not present

## 2024-01-31 DIAGNOSIS — I9589 Other hypotension: Secondary | ICD-10-CM | POA: Diagnosis not present

## 2024-01-31 LAB — CBC
HCT: 41.1 % (ref 36.0–46.0)
Hemoglobin: 11.3 g/dL — ABNORMAL LOW (ref 12.0–15.0)
MCH: 19.6 pg — ABNORMAL LOW (ref 26.0–34.0)
MCHC: 27.5 g/dL — ABNORMAL LOW (ref 30.0–36.0)
MCV: 71.2 fL — ABNORMAL LOW (ref 80.0–100.0)
Platelets: 322 10*3/uL (ref 150–400)
RBC: 5.77 MIL/uL — ABNORMAL HIGH (ref 3.87–5.11)
RDW: 22.5 % — ABNORMAL HIGH (ref 11.5–15.5)
WBC: 28.4 10*3/uL — ABNORMAL HIGH (ref 4.0–10.5)
nRBC: 0.3 % — ABNORMAL HIGH (ref 0.0–0.2)

## 2024-01-31 LAB — BASIC METABOLIC PANEL
Anion gap: 8 (ref 5–15)
BUN: 22 mg/dL (ref 8–23)
CO2: 22 mmol/L (ref 22–32)
Calcium: 7.6 mg/dL — ABNORMAL LOW (ref 8.9–10.3)
Chloride: 107 mmol/L (ref 98–111)
Creatinine, Ser: 1.15 mg/dL — ABNORMAL HIGH (ref 0.44–1.00)
GFR, Estimated: 51 mL/min — ABNORMAL LOW (ref >60)
Glucose, Bld: 100 mg/dL — ABNORMAL HIGH (ref 70–99)
Potassium: 3.9 mmol/L (ref 3.5–5.1)
Sodium: 137 mmol/L (ref 135–145)

## 2024-01-31 LAB — LACTIC ACID, PLASMA: Lactic Acid, Venous: 0.7 mmol/L (ref 0.5–1.9)

## 2024-01-31 MED ORDER — FUROSEMIDE 10 MG/ML IJ SOLN
40.0000 mg | Freq: Every day | INTRAMUSCULAR | Status: DC
  Administered 2024-01-31 – 2024-02-02 (×3): 40 mg via INTRAVENOUS
  Filled 2024-01-31 (×3): qty 4

## 2024-01-31 MED ORDER — IPRATROPIUM-ALBUTEROL 0.5-2.5 (3) MG/3ML IN SOLN
3.0000 mL | Freq: Four times a day (QID) | RESPIRATORY_TRACT | Status: DC
  Administered 2024-01-31 (×2): 3 mL via RESPIRATORY_TRACT
  Filled 2024-01-31 (×2): qty 3

## 2024-01-31 MED ORDER — IPRATROPIUM-ALBUTEROL 0.5-2.5 (3) MG/3ML IN SOLN: 3.0000 mL | RESPIRATORY_TRACT | Status: DC | PRN

## 2024-01-31 MED ORDER — SODIUM CHLORIDE 0.45 % IV SOLN
INTRAVENOUS | Status: AC
  Filled 2024-01-31 (×2): qty 75

## 2024-01-31 NOTE — Progress Notes (Addendum)
[email or web address] multiple times to please not remove blood pressure cuff. RN back into room due to BP cuff being off.  Pt.also educated multiple times to keep foam dressings on abrasions on left leg. Pt.found multiple times with foams removed and pt.was picking at abrasions until they bled.  Areas cleaned and dressings reapplied.

## 2024-01-31 NOTE — Progress Notes (Signed)
 PROGRESS NOTE   [name], is a 73 y.o. female, DOB - [birth year], NWG:[id number]  Admit date - 01/28/2024   Admitting Physician Madellyn Denio Quintella Buck, MD  Outpatient Primary MD for the patient is Milian, Winda Hastings, FNP  LOS - 2  Chief Complaint  Patient presents with   Weakness   Nausea      Brief Summary:- 73 y.o. female with medical history significant of hypertension, hyperlipidemia, CAD, PVD with right AKA, chronic atrial fibrillation, polycythemia vera, prior stroke and MI admitted on 01/29/24 with acute hypoxic Resp Failure and hypotension with concerns for altered mental status in the setting of recurrent emesis   A/p 1) acute hypoxic respiratory failure--patient with  pulmonary hypertension 01/31/24 Worsening hypoxia with increased oxygen requirement -- Dyspnea persist -Oxygen requirement is up to 5 L via nasal cannula -Repeat chest x-ray on 01/31/2024 with pulmonary venous congestion/pulmonary edema -COVID, flu and RSV negative---continue bronchodilators and mucolytics -Leukocytosis noted but this is not new patient has history of persistent significant leukocytosis with WBC usually over 30k from time to time -CTA chest from 01/30/2024 without significant infectious findings --Start IV Lasix  first dose 01/31/2024   2)Hypotension--- only on the left upper extremity due to severe subclavian artery stenosis a.m. cortisol is 17 (Not low)--  On her  right arm  BP has been above systolic BP 200s,  Her left UE BP is usually very low. -With systolic blood pressure often below 90 -UDS with opiates -UA is not consistent with a UTI -IV fluids and midodrine  as ordered -Echo requested -Ok to Use low-dose pressors if required to keep MAP above 60   3)Severe Stenosis of Left Subclavian Artery--- .Discussed with vascular surgeon Dr Ewing Holiday states that findings are not new, no vascular intervention required at this time  -prior imaging study from 2022 showed Widespread  atherosclerosis including a severe left subclavian artery stenosis proximal to the left vertebral artery origin with possible subclavian steal syndrome Repeat CTA chest on 01/30/2024 shows---Severe left subclavian artery stenosis in 2022, not re-evaluated due to the contrast phase of this Exam, Severe atherosclerosis, Aortic Atherosclerosis . -Aspirin , apixaban  fenofibrate  and Zetia  as prescribed -Patient is apparently intolerant to statins -Dr. Christia Cowboy recommends outpatient follow-up with vascular surgery team  4) acute anemia--- hemoglobin down to 11.7 baseline usually between 12 and 13 -Workup consistent with iron deficiency with serum iron of 15 and saturation of 5 and ferritin of 17 -No bleeding concerns -Monitor closely and transfuse as clinically indicated   5) acute metabolic encephalopathy--due to #1 and #2 above -Mentation has improved significantly   6)HFpEF--acute on chronic diastolic dysfunction CHF exacerbation-- -echo from 01/30/2024 with EF of 60 to 65% with grade 1 diastolic dysfunction, severe asymmetric LVH, elevated left ventricular end-diastolic pressures,, moderate mitral stenosis, no aortic stenosis worsening hypoxia with increased oxygen requirement -- Dyspnea persist -Oxygen requirement is up to 5 L via nasal cannula -Repeat chest x-ray on 01/31/2024 with pulmonary venous congestion/pulmonary edema ---Start IV Lasix  first dose 01/31/2024 -Fluid input and output monitoring and daily weights   7)CKD stage -3B   --metabolic acidosis noted -Give IV fluids/bicarb drip for the next 24 hours or so for renal protection due to contrast study renally adjust medications, avoid nephrotoxic agents / dehydration  / hypotension  8)Possible Lymphoma--CTA chest on 01/29/2023 shows positive for Severe Splenomegaly, progressed since 2023 and in association with new enlarged mediastinal lymph nodes and trace pleural effusions. Consider Lymphoma or other Lymphoproliferative disorder. --  Outpatient follow-up advised  9)HTN--- please see #  2 above -c/n  amlodipine  10 mg daily, every provide ordered -May use IV hydralazine  as needed  10)Social/Ethics--plan of care discussed with patient's daughter Cyndee Dragon and granddaughter Cazzie -Patient is a DNR/DNI  11)Vomiting--- no further emesis for now - advance diet as tolerated -As needed antiemetics  Status is: Inpatient   Disposition: The patient is from: Home              Anticipated d/c is to: Home              Anticipated d/c date is: 1 day              Patient currently is not medically stable to d/c. Barriers: Not Clinically Stable-   Code Status :  -  Code Status: Limited: Do not attempt resuscitation (DNR) -DNR-LIMITED -Do Not Intubate/DNI    Family Communication:   daughter Cyndee Dragon and granddaughter Cazzie  DVT Prophylaxis  :   - SCDs   apixaban  (ELIQUIS ) tablet 2.5 mg Start: 01/30/24 2200 SCDs Start: 01/29/24 0156 --- Lovenox  discontinued apixaban  (ELIQUIS ) tablet 2.5 mg   Lab Results  Component Value Date   PLT 322 01/31/2024    Inpatient Medications  Scheduled Meds:  amLODipine   10 mg Oral Daily   apixaban   2.5 mg Oral BID   aspirin  EC  81 mg Oral Q breakfast   Chlorhexidine  Gluconate Cloth  6 each Topical Q0600   dapagliflozin  propanediol  5 mg Oral Daily   dextromethorphan -guaiFENesin   1 tablet Oral BID   doxycycline   100 mg Oral Q12H   escitalopram   20 mg Oral Daily   ezetimibe   10 mg Oral Daily   fenofibrate   160 mg Oral Daily   flecainide   50 mg Oral BID   furosemide   40 mg Intravenous Daily   irbesartan   300 mg Oral Daily   pantoprazole   40 mg Oral Daily   Continuous Infusions:  cefTRIAXone  (ROCEPHIN )  IV 2 g (01/31/24 0912)   sodium bicarbonate  75 mEq in sodium chloride  0.45 % 1,075 mL infusion 20 mL/hr at 01/31/24 1428   PRN Meds:.ipratropium-albuterol , labetalol , ondansetron  **OR** ondansetron  (ZOFRAN ) IV   Anti-infectives (From admission, onward)    Start     Dose/Rate Route  Frequency Ordered Stop   01/29/24 1100  cefTRIAXone  (ROCEPHIN ) 2 g in sodium chloride  0.9 % 100 mL IVPB        2 g 200 mL/hr over 30 Minutes Intravenous Every 24 hours 01/29/24 0958     01/29/24 1100  doxycycline  (VIBRA -TABS) tablet 100 mg        100 mg Oral Every 12 hours 01/29/24 0958          Subjective: Tashika Gerst today has no fevers, no emesis,  No chest pain,    -Mentation and blood pressure continues to improve - No further headaches - Worsening hypoxia with increased oxygen requirement -- Dyspnea persist--- requiring 5 L of oxygen via nasal cannula  Objective: Vitals:   01/31/24 1600 01/31/24 1625 01/31/24 1645 01/31/24 1650  BP: (!) 126/45     Pulse: 81  86 90  Resp: 13  15 (!) 25  Temp:  98.7 F (37.1 C)    TempSrc:  Oral    SpO2: 90%  93% 94%  Weight:      Height:        Intake/Output Summary (Last 24 hours) at 01/31/2024 1818 Last data filed at 01/31/2024 1603 Gross per 24 hour  Intake 2532.8 ml  Output --  Net 2532.8 ml  Filed Weights   01/29/24 1209 01/30/24 0555 01/31/24 0441  Weight: 48.1 kg 48.1 kg 51 kg    Physical Exam  Gen:- Awake Alert, no acute distress, more coherent HEENT:- [place].AT, No sclera icterus Nose- [place] 5L/min Neck-Supple Neck,No JVD,.  Lungs-fair air movement, faint bibasilar rales CV- S1, S2 normal, regular  Abd-  +ve B.Sounds, Abd Soft, No tenderness,    Extremity/Skin:- No  edema, pedal pulses present on the Lt    Psych-affect is appropriate, oriented x3 Neuro-generalized weakness , no new focal deficits, no tremors MSK-right AKA stump Left hand with improving cap refill in fingers, left hand fingers less dusky appearing  - please see photos in epic   Data Reviewed: I have personally reviewed following labs and imaging studies  CBC: Recent Labs  Lab 01/24/24 2310 01/28/24 1827 01/29/24 0326 01/30/24 0443 01/31/24 0353  WBC 38.7* 34.8* 28.6* 28.7* 28.4*  NEUTROABS  --  30.2*  --   --   --   HGB 13.1 12.9 11.7*  11.3* 11.3*  HCT 46.8* 46.8* 42.3 40.5 41.1  MCV 71.5* 72.7* 72.7* 71.3* 71.2*  PLT 458* 356 333 329 322   Basic Metabolic Panel: Recent Labs  Lab 01/24/24 2310 01/28/24 1827 01/29/24 0326 01/30/24 0443 01/31/24 0353  NA 136 136 136 135 137  K 4.8 4.5 4.1 3.8 3.9  CL 111 109 109 109 107  CO2 16* 16* 16* 16* 22  GLUCOSE 93 101* 82 108* 100*  BUN 24* 30* 31* 26* 22  CREATININE 1.38* 1.54* 1.35* 1.15* 1.15*  CALCIUM  8.3* 8.4* 8.2* 7.9* 7.6*  MG  --   --  1.6*  --   --   PHOS  --   --  4.5  --   --    GFR: Estimated Creatinine Clearance: 35.6 mL/min (A) (by C-G formula based on SCr of 1.15 mg/dL (H)). Liver Function Tests: Recent Labs  Lab 01/28/24 1827 01/29/24 0326  AST 20 18  ALT 12 11  ALKPHOS 90 84  BILITOT 0.8 0.7  PROT 6.1* 5.6*  ALBUMIN  2.9* 2.7*   Recent Results (from the past 240 hours)  Resp panel by RT-PCR (RSV, Flu A&B, Covid) Anterior Nasal Swab     Status: None   Collection Time: 01/28/24  6:17 PM   Specimen: Anterior Nasal Swab  Result Value Ref Range Status   SARS Coronavirus 2 by RT PCR NEGATIVE NEGATIVE Final    Comment: (NOTE) SARS-CoV-2 target nucleic acids are NOT DETECTED.  The SARS-CoV-2 RNA is generally detectable in upper respiratory specimens during the acute phase of infection. The lowest concentration of SARS-CoV-2 viral copies this assay can detect is 138 copies/mL. A negative result does not preclude SARS-Cov-2 infection and should not be used as the sole basis for treatment or other patient management decisions. A negative result may occur with  improper specimen collection/handling, submission of specimen other than nasopharyngeal swab, presence of viral mutation(s) within the areas targeted by this assay, and inadequate number of viral copies(<138 copies/mL). A negative result must be combined with clinical observations, patient history, and epidemiological information. The expected result is Negative.  Fact Sheet for Patients:   BloggerCourse.com  Fact Sheet for Healthcare Providers:  SeriousBroker.it  This test is no t yet approved or cleared by the [place]  FDA and  has been authorized for detection and/or diagnosis of SARS-CoV-2 by FDA under an Emergency Use Authorization (EUA). This EUA will remain  in effect (meaning this test can be used) for  the duration of the COVID-19 declaration under Section 564(b)(1) of the Act, 21 U.S.C.section 360bbb-3(b)(1), unless the authorization is terminated  or revoked sooner.       Influenza A by PCR NEGATIVE NEGATIVE Final   Influenza B by PCR NEGATIVE NEGATIVE Final    Comment: (NOTE) The Xpert Xpress SARS-CoV-2/FLU/RSV plus assay is intended as an aid in the diagnosis of influenza from Nasopharyngeal swab specimens and should not be used as a sole basis for treatment. Nasal washings and aspirates are unacceptable for Xpert Xpress SARS-CoV-2/FLU/RSV testing.  Fact Sheet for Patients: BloggerCourse.com  Fact Sheet for Healthcare Providers: SeriousBroker.it  This test is not yet approved or cleared by the [place]  FDA and has been authorized for detection and/or diagnosis of SARS-CoV-2 by FDA under an Emergency Use Authorization (EUA). This EUA will remain in effect (meaning this test can be used) for the duration of the COVID-19 declaration under Section 564(b)(1) of the Act, 21 U.S.C. section 360bbb-3(b)(1), unless the authorization is terminated or revoked.     Resp Syncytial Virus by PCR NEGATIVE NEGATIVE Final    Comment: (NOTE) Fact Sheet for Patients: BloggerCourse.com  Fact Sheet for Healthcare Providers: SeriousBroker.it  This test is not yet approved or cleared by the [place]  FDA and has been authorized for detection and/or diagnosis of SARS-CoV-2 by FDA under an Emergency Use  Authorization (EUA). This EUA will remain in effect (meaning this test can be used) for the duration of the COVID-19 declaration under Section 564(b)(1) of the Act, 21 U.S.C. section 360bbb-3(b)(1), unless the authorization is terminated or revoked.  Performed at [facility], [address], [place], [place] [address]   Blood culture (routine x 2)     Status: None (Preliminary result)   Collection Time: 01/28/24  7:52 PM   Specimen: Left Antecubital; Blood  Result Value Ref Range Status   Specimen Description LEFT ANTECUBITAL  Final   Special Requests NONE  Final   Culture   Final    NO GROWTH 3 DAYS Performed at [facility], [address] [place], [place] [address]    Report Status PENDING  Incomplete  Blood culture (routine x 2)     Status: None (Preliminary result)   Collection Time: 01/28/24  7:58 PM   Specimen: BLOOD LEFT FOREARM  Result Value Ref Range Status   Specimen Description BLOOD LEFT FOREARM  Final   Special Requests NONE  Final   Culture   Final    NO GROWTH 3 DAYS Performed at [facility], [address], [place], [place] [address]    Report Status PENDING  Incomplete  MRSA Next Gen by PCR, Nasal     Status: None   Collection Time: 01/29/24 12:08 PM   Specimen: Nasal Mucosa; Nasal Swab  Result Value Ref Range Status   MRSA by PCR Next Gen NOT DETECTED NOT DETECTED Final    Comment: (NOTE) The GeneXpert MRSA Assay (FDA approved for NASAL specimens only), is one component of a comprehensive MRSA colonization surveillance program. It is not intended to diagnose MRSA infection nor to guide or monitor treatment for MRSA infections. Test performance is not FDA approved in patients less than 37 years old. Performed at [facility], [address] [place], [place] [address]     Radiology Studies: DG Chest 2 View Result Date: 01/31/2024 CLINICAL DATA:  Dyspnea EXAM: CHEST - 2 VIEW COMPARISON:  01/28/2024 FINDINGS: Cardiac shadow is enlarged. Aortic  calcifications are noted. The lungs are well aerated bilaterally.  No focal infiltrate or effusion is noted. Interstitial opacities are again identified likely related to edema. No bony abnormality is noted. IMPRESSION: Persistent edema bilaterally but improved aeration in the right base. Electronically Signed   By: Violeta Grey M.D.   On: 01/31/2024 10:47   ECHOCARDIOGRAM COMPLETE Result Date: 01/30/2024    ECHOCARDIOGRAM REPORT   Patient Name:   NATIA HUYCK Date of Exam: 01/30/2024 Medical Rec #:  [id number]      Height:       65.0 in Accession #:    [id number]     Weight:       106.0 lb Date of Birth:  Dec 10, 1951       BSA:          1.511 m Patient Age:    72 years       BP:           93/63 mmHg Patient Gender: F              HR:           90 bpm. Exam Location:  [facility] Procedure: 2D Echo, Cardiac Doppler and Color Doppler Indications:     CHF-Acute Systolic I50.21  History:         Patient has prior history of Echocardiogram examinations, most                  recent 11/04/2021. CAD and Previous Myocardial Infarction,                  Stroke; Risk Factors:Hypertension.  Sonographer:     Astrid Blamer Referring Phys:  [id number] OLADAPO ADEFESO Diagnosing Phys: Vishnu Priya Mallipeddi IMPRESSIONS  1. Left ventricular ejection fraction, by estimation, is 60 to 65%. The left ventricle has normal function. Left ventricular endocardial border not optimally defined to evaluate regional wall motion. There is severe asymmetric left ventricular hypertrophy of the septal and basal segments. Left ventricular diastolic parameters are consistent with Grade I diastolic dysfunction (impaired relaxation). Elevated left ventricular end-diastolic pressure.  2. Right ventricular systolic function is normal. The right ventricular size is normal. Mildly increased right ventricular wall thickness. Tricuspid regurgitation signal is inadequate for assessing PA pressure.  3. Left atrial size was mildly dilated.  4. The mitral valve is  abnormal. Trivial mitral valve regurgitation. Moderate mitral stenosis. The mean mitral valve gradient is 7.3 mmHg. Severe mitral annular calcification.  5. The aortic valve was not well visualized. Aortic valve regurgitation is not visualized. No aortic stenosis is present.  6. The inferior vena cava is normal in size with greater than 50% respiratory variability, suggesting right atrial pressure of 3 mmHg. Comparison(s): Changes from prior study are noted. MS is moderate now. FINDINGS  Left Ventricle: Left ventricular ejection fraction, by estimation, is 60 to 65%. The left ventricle has normal function. Left ventricular endocardial border not optimally defined to evaluate regional wall motion. The left ventricular internal cavity size was normal in size. There is severe asymmetric left ventricular hypertrophy of the septal and basal segments. Left ventricular diastolic parameters are consistent with Grade I diastolic dysfunction (impaired relaxation). Elevated left ventricular end-diastolic pressure. Right Ventricle: The right ventricular size is normal. Mildly increased right ventricular wall thickness. Right ventricular systolic function is normal. Tricuspid regurgitation signal is inadequate for assessing PA pressure. Left Atrium: Left atrial size was mildly dilated. Right Atrium: Right atrial size was normal in size. Pericardium: There is no evidence of pericardial effusion. Mitral Valve: The mitral  valve is abnormal. Severe mitral annular calcification. Trivial mitral valve regurgitation. Moderate mitral valve stenosis. MV peak gradient, 13.5 mmHg. The mean mitral valve gradient is 7.3 mmHg. Tricuspid Valve: The tricuspid valve is grossly normal. Tricuspid valve regurgitation is not demonstrated. No evidence of tricuspid stenosis. Aortic Valve: The aortic valve was not well visualized. Aortic valve regurgitation is not visualized. No aortic stenosis is present. Aortic valve mean gradient measures 5.0 mmHg.  Aortic valve peak gradient measures 7.6 mmHg. Aortic valve area, by VTI measures 2.26 cm. Pulmonic Valve: The pulmonic valve was not well visualized. Pulmonic valve regurgitation is not visualized. No evidence of pulmonic stenosis. Aorta: The aortic root is normal in size and structure. Venous: The inferior vena cava is normal in size with greater than 50% respiratory variability, suggesting right atrial pressure of 3 mmHg. IAS/Shunts: No atrial level shunt detected by color flow Doppler.  LEFT VENTRICLE PLAX 2D LVIDd:         3.70 cm   Diastology LVIDs:         2.50 cm   LV e' medial:    6.20 cm/s LV PW:         1.10 cm   LV E/e' medial:  18.2 LV IVS:        1.70 cm   LV e' lateral:   7.72 cm/s LVOT diam:     1.80 cm   LV E/e' lateral: 14.6 LV SV:         57 LV SV Index:   38 LVOT Area:     2.54 cm  RIGHT VENTRICLE RV S prime:     18.00 cm/s TAPSE (M-mode): 1.8 cm LEFT ATRIUM             Index        RIGHT ATRIUM           Index LA Vol (A2C):   70.9 ml 46.94 ml/m  RA Area:     13.60 cm LA Vol (A4C):   35.7 ml 23.63 ml/m  RA Volume:   30.40 ml  20.12 ml/m LA Biplane Vol: 52.4 ml 34.69 ml/m  AORTIC VALVE AV Area (Vmax):    2.60 cm AV Area (Vmean):   2.06 cm AV Area (VTI):     2.26 cm AV Vmax:           138.00 cm/s AV Vmean:          106.000 cm/s AV VTI:            0.252 m AV Peak Grad:      7.6 mmHg AV Mean Grad:      5.0 mmHg LVOT Vmax:         141.00 cm/s LVOT Vmean:        86.000 cm/s LVOT VTI:          0.224 m LVOT/AV VTI ratio: 0.89  AORTA Ao Root diam: 2.70 cm MITRAL VALVE MV Area (PHT): 2.62 cm     SHUNTS MV Area VTI:   1.50 cm     Systemic VTI:  0.22 m MV Peak grad:  13.5 mmHg    Systemic Diam: 1.80 cm MV Mean grad:  7.3 mmHg MV Vmax:       1.84 m/s MV Vmean:      126.0 cm/s MV Decel Time: 290 msec MV E velocity: 113.00 cm/s MV A velocity: 147.00 cm/s MV E/A ratio:  0.77 Vishnu Priya Mallipeddi Electronically signed by Lucetta Russel Mallipeddi Signature Date/Time: 01/30/2024/2:44:30 PM  Final  (Updated)    CT Angio Chest Pulmonary Embolism (PE) W or WO Contrast Result Date: 01/30/2024 CLINICAL DATA:  73 year old female with shortness of breath, weakness. History of advanced atherosclerosis, severe left subclavian artery stenosis in 2022. EXAM: CT ANGIOGRAPHY CHEST WITH CONTRAST TECHNIQUE: Multidetector CT imaging of the chest was performed using the standard protocol during bolus administration of intravenous contrast. Multiplanar CT image reconstructions and MIPs were obtained to evaluate the vascular anatomy. RADIATION DOSE REDUCTION: This exam was performed according to the departmental dose-optimization program which includes automated exposure control, adjustment of the mA and/or kV according to patient size and/or use of iterative reconstruction technique. CONTRAST:  75mL OMNIPAQUE  IOHEXOL  350 MG/ML SOLN COMPARISON:  CTA head and neck 11/04/2021.  CTA chest 09/09/2018. FINDINGS: Cardiovascular: Excellent contrast bolus timing in the pulmonary arterial tree. Mild to moderate central pulmonary artery enlargement (series 5, image 123), more conspicuous than in 2019. Mild respiratory motion. No pulmonary artery filling defect identified. Advanced Calcified aortic atherosclerosis. Advanced calcified coronary artery atherosclerosis. Negative for thoracic aortic aneurysm or dissection. Great vessel patency not well evaluated due to contrast timing, but diffuse proximal left subclavian artery atherosclerosis persists. Cardiomegaly appears increased since 2019.  No pericardial effusion. Mediastinum/Nodes: Enlarged mediastinal lymph nodes since 2019, with an almost 2 cm anterior carina lymph node now on series 4, image 32. No obvious lymphadenopathy at the thoracic inlet. No axillary lymphadenopathy. Lungs/Pleura: Trace layering pleural effusions. Lower lung volumes compared to 2019. Generalized pulmonary ground-glass opacity without septal thickening, probably atelectasis. Major airways remain patent. No  consolidation. No pulmonary mass or nodule identified. Upper Abdomen: Marked splenomegaly appears progressed from the most recent CT abdomen and 2023, splenic length greater than 16 cm. Visible splenic parenchyma is homogeneous. Negative visible liver. Cholelithiasis. No visible pericholecystic inflammation. Partial pancreatic atrophy. Partial renal atrophy. Adrenal glands and visible bowel remain within normal limits. No upper abdominal free air or free fluid. Musculoskeletal: Chronic mild T4 compression fracture. No acute or suspicious osseous lesion identified. Chronic left glenoid degeneration. Review of the MIP images confirms the above findings. IMPRESSION: 1. Negative for acute pulmonary embolus. Central pulmonary artery enlargement suspicious for pulmonary artery hypertension. 2. Positive for Severe Splenomegaly, progressed since 2023 and in association with new enlarged mediastinal lymph nodes and trace pleural effusions. Consider Lymphoma or other Lymphoproliferative disorder. 3. Severe atherosclerosis, Aortic Atherosclerosis (ICD10-I70.0). Cardiomegaly. Severe left subclavian artery stenosis in 2022, not re-evaluated due to the contrast phase of this exam. 4. Cholelithiasis. Electronically Signed   By: Marlise Simpers M.D.   On: 01/30/2024 12:31   Scheduled Meds:  amLODipine   10 mg Oral Daily   apixaban   2.5 mg Oral BID   aspirin  EC  81 mg Oral Q breakfast   Chlorhexidine  Gluconate Cloth  6 each Topical Q0600   dapagliflozin  propanediol  5 mg Oral Daily   dextromethorphan -guaiFENesin   1 tablet Oral BID   doxycycline   100 mg Oral Q12H   escitalopram   20 mg Oral Daily   ezetimibe   10 mg Oral Daily   fenofibrate   160 mg Oral Daily   flecainide   50 mg Oral BID   furosemide   40 mg Intravenous Daily   irbesartan   300 mg Oral Daily   pantoprazole   40 mg Oral Daily   Continuous Infusions:  cefTRIAXone  (ROCEPHIN )  IV 2 g (01/31/24 0912)   sodium bicarbonate  75 mEq in sodium chloride  0.45 % 1,075 mL  infusion 20 mL/hr at 01/31/24 1428    LOS: 2 days  Colin Dawley M.D on 01/31/2024 at 6:18 PM  Go to [email or web address] - for contact info  [facility] - Office  [phone]  If 7PM-7AM, please contact night-coverage [email or web address] 01/31/2024, 6:18 PM

## 2024-01-31 NOTE — Care Management Important Message (Signed)
 Important Message  Patient Details  Name: [name] MRN: [id number] Date of Birth: March 25, 1951   Important Message Given:  N/A - LOS <3 / Initial given by admissions     Neila Bally 01/31/2024, 11:45 AM

## 2024-02-01 DIAGNOSIS — I10 Essential (primary) hypertension: Secondary | ICD-10-CM | POA: Diagnosis not present

## 2024-02-01 DIAGNOSIS — I950 Idiopathic hypotension: Secondary | ICD-10-CM

## 2024-02-01 DIAGNOSIS — D72823 Leukemoid reaction: Secondary | ICD-10-CM | POA: Diagnosis not present

## 2024-02-01 DIAGNOSIS — N1832 Chronic kidney disease, stage 3b: Secondary | ICD-10-CM | POA: Diagnosis not present

## 2024-02-01 LAB — RENAL FUNCTION PANEL
Albumin: 2.3 g/dL — ABNORMAL LOW (ref 3.5–5.0)
Anion gap: 9 (ref 5–15)
BUN: 27 mg/dL — ABNORMAL HIGH (ref 8–23)
CO2: 24 mmol/L (ref 22–32)
Calcium: 7.5 mg/dL — ABNORMAL LOW (ref 8.9–10.3)
Chloride: 104 mmol/L (ref 98–111)
Creatinine, Ser: 1.25 mg/dL — ABNORMAL HIGH (ref 0.44–1.00)
GFR, Estimated: 46 mL/min — ABNORMAL LOW (ref >60)
Glucose, Bld: 82 mg/dL (ref 70–99)
Phosphorus: 3.9 mg/dL (ref 2.5–4.6)
Potassium: 3.7 mmol/L (ref 3.5–5.1)
Sodium: 137 mmol/L (ref 135–145)

## 2024-02-01 NOTE — Progress Notes (Signed)
Enrigue Catena, Dr.Courage and Victorino Dike RN notified of pt's need for home oxygen. See flowsheet for specifics.
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
## 2024-02-01 NOTE — Plan of Care (Signed)
  Problem: Education: Goal: Knowledge of General Education information will improve Description: Including pain rating scale, medication(s)/side effects and non-pharmacologic comfort measures Outcome: Progressing   Problem: Activity: Goal: Risk for activity intolerance will decrease Outcome: Progressing   Problem: Coping: Goal: Level of anxiety will decrease Outcome: Progressing   Problem: Elimination: Goal: Will not experience complications related to bowel motility Outcome: Progressing Goal: Will not experience complications related to urinary retention Outcome: Progressing   Problem: Pain Managment: Goal: General experience of comfort will improve and/or be controlled Outcome: Progressing

## 2024-02-01 NOTE — Discharge Instructions (Addendum)
1)Please follow-up with hematologist/oncologist Dr. Doreatha Massed, MD for workup for possible lymphoma or other Lymphoproliferative disorder.  -Address: inside [facility] (4th Floor), [address] [place], [place], [place] [address] Phone: [phone]  2) follow-up with Dr. Gerarda Fraction from vascular surgery for left subclavian artery stenosis

## 2024-02-01 NOTE — Progress Notes (Signed)
[email or web address] multiple times to not remove equipment and oxygen. Pt.verbalizes understanding, however at times continues to remove equipment,oxygen and dressings.

## 2024-02-01 NOTE — Plan of Care (Signed)

## 2024-02-01 NOTE — Progress Notes (Signed)
Dr.Courage notified that pt's daughter asking to speak with him and also of concern that pt.does not have someone with her 24/7 and she takes off her oxygen a lot.

## 2024-02-01 NOTE — Progress Notes (Signed)
PROGRESS NOTE   [name], is a 73 y.o. female, DOB - March 13, 1951, WUJ:[id number]  Admit date - 01/28/2024   Admitting Physician Rashawn Rolon Mariea Clonts, MD  Outpatient Primary MD for the patient is Milian, Aleen Campi, FNP  LOS - 3  Chief Complaint  Patient presents with   Weakness   Nausea      Brief Summary:- 73 y.o. female with medical history significant of hypertension, hyperlipidemia, CAD, PVD with right AKA, chronic atrial fibrillation, polycythemia vera, prior stroke and MI admitted on 01/29/24 with acute hypoxic Resp Failure and hypotension with concerns for altered mental status in the setting of recurrent emesis   A/p 1) acute hypoxic respiratory failure--patient with  pulmonary hypertension and CHF 02/01/24 ---Dyspnea improving -Oxygen requirement is down to 3 L from  5 L via nasal cannula -Repeat chest x-ray on 01/31/2024 with pulmonary venous congestion/pulmonary edema -COVID, flu and RSV negative---continue bronchodilators and mucolytics -Leukocytosis noted but this is not new patient has history of persistent significant leukocytosis with WBC usually over 30k from time to time -CTA chest from 01/30/2024 without significant infectious findings --Started IV Lasix first dose 01/31/2024 -Repeat chest x-ray on 02/01/2024   2)Hypotension--- only on the left upper extremity due to severe subclavian artery stenosis --a.m. cortisol is 17 (Not low)--  On her  right arm  BP has been either high or normal , -  Her left UE BP is usually very low. -With systolic blood pressure often below 90 -UDS with opiates -UA is not consistent with a UTI Patient initially received IV fluids Echo as seen below in #6   3)Severe Stenosis of Left Subclavian Artery--- .Discussed with vascular surgeon Dr Terrilee Croak states that findings are not new, no vascular intervention required at this time  -prior imaging study from 2022 showed Widespread atherosclerosis including a severe left subclavian  artery stenosis proximal to the left vertebral artery origin with possible subclavian steal syndrome Repeat CTA chest on 01/30/2024 shows---Severe left subclavian artery stenosis in 2022, not re-evaluated due to the contrast phase of this Exam, Severe atherosclerosis, Aortic Atherosclerosis . -Aspirin, apixaban fenofibrate and Zetia as prescribed -Patient is apparently intolerant to statins -Dr. Sherral Hammers recommends outpatient follow-up with vascular surgery team  4) acute anemia--- hemoglobin down to 11.7 baseline usually between 12 and 13 -Workup consistent with iron deficiency with serum iron of 15 and saturation of 5 and ferritin of 17 -No bleeding concerns -Monitor closely and transfuse as clinically indicated   5) acute metabolic encephalopathy--due to #1 and #2 above -Mentation has improved significantly   6)HFpEF--acute on chronic diastolic dysfunction CHF exacerbation-- -echo from 01/30/2024 with EF of 60 to 65% with grade 1 diastolic dysfunction, severe asymmetric LVH, elevated left ventricular end-diastolic pressures,, moderate mitral stenosis, no aortic stenosis worsening hypoxia with increased oxygen requirement Dyspnea improving -Oxygen requirement is down to 3 L from  5 L via nasal cannula -Repeat chest x-ray on 01/31/2024 with pulmonary venous congestion/pulmonary edema ---Started IV Lasix first dose 01/31/2024 -Fluid input and output monitoring and daily weights -Repeat chest x-ray on 02/01/2023   7)CKD stage -3B   --metabolic acidosis resolved with bicarb drip  -Gave bicarb drip for renal protection due to contrast study renally adjust medications, avoid nephrotoxic agents / dehydration  / hypotension  8)Possible Lymphoma--CTA chest on 01/29/2023 shows positive for Severe Splenomegaly, progressed since 2023 and in association with new enlarged mediastinal lymph nodes and trace pleural effusions.-- Outpatient follow-up with Dr. Ellin Saba advised for work up for Lymphoma or other  Lymphoproliferative disorder.  9)HTN--- please see #2 above -c/n  amlodipine 10 mg daily,  -May use IV hydralazine as needed  10)Social/Ethics--plan of care discussed with patient's daughter Charlynne Pander and granddaughter Altamese  -Patient is a DNR/DNI  11)Vomiting--- no further emesis for now - advance diet as tolerated -As needed antiemetics  12) generalized weakness and deconditioning--family will bring her right AKA prosthesis to the hospital so physical therapy can work with her --  Status is: Inpatient   Disposition: The patient is from: Home              Anticipated d/c is to: Home              Anticipated d/c date is: 1 day              Patient currently is not medically stable to d/c. Barriers: Not Clinically Stable-   Code Status :  -  Code Status: Limited: Do not attempt resuscitation (DNR) -DNR-LIMITED -Do Not Intubate/DNI    Family Communication:   daughter Charlynne Pander and granddaughter Engineer, agricultural  DVT Prophylaxis  :   - SCDs   apixaban Everlene Balls) tablet 2.5 mg Start: 01/30/24 2200 SCDs Start: 01/29/24 0156 --- Lovenox discontinued apixaban (ELIQUIS) tablet 2.5 mg   Lab Results  Component Value Date   PLT 322 01/31/2024    Inpatient Medications  Scheduled Meds:  amLODipine  10 mg Oral Daily   apixaban  2.5 mg Oral BID   aspirin EC  81 mg Oral Q breakfast   Chlorhexidine Gluconate Cloth  6 each Topical Q0600   dapagliflozin propanediol  5 mg Oral Daily   dextromethorphan-guaiFENesin  1 tablet Oral BID   doxycycline  100 mg Oral Q12H   escitalopram  20 mg Oral Daily   ezetimibe  10 mg Oral Daily   fenofibrate  160 mg Oral Daily   flecainide  50 mg Oral BID   furosemide  40 mg Intravenous Daily   irbesartan  300 mg Oral Daily   pantoprazole  40 mg Oral Daily   Continuous Infusions:  cefTRIAXone (ROCEPHIN)  IV 2 g (02/01/24 0903)   PRN Meds:.ipratropium-albuterol, labetalol, ondansetron **OR** ondansetron (ZOFRAN) IV   Anti-infectives (From admission, onward)     Start     Dose/Rate Route Frequency Ordered Stop   01/29/24 1100  cefTRIAXone (ROCEPHIN) 2 g in sodium chloride 0.9 % 100 mL IVPB        2 g 200 mL/hr over 30 Minutes Intravenous Every 24 hours 01/29/24 0958     01/29/24 1100  doxycycline (VIBRA-TABS) tablet 100 mg        100 mg Oral Every 12 hours 01/29/24 0958          Subjective: [name] today has no fevers, no emesis,  No chest pain,    -Mentation and blood pressure continues to improve - -Dyspnea and hypoxia improving -Voiding well -daughter visited No further headaches   Objective: Vitals:   02/01/24 1300 02/01/24 1330 02/01/24 1400 02/01/24 1540  BP: (!) 175/59  (!) 159/56 (!) 170/72  Pulse: 93 84 86 77  Resp: 15 17 15    Temp:    98.2 F (36.8 C)  TempSrc:    Oral  SpO2: 93% 95% 92% 94%  Weight:      Height:        Intake/Output Summary (Last 24 hours) at 02/01/2024 1850 Last data filed at 02/01/2024 1454 Gross per 24 hour  Intake 964.93 ml  Output --  Net 964.93 ml  Filed Weights   01/29/24 1209 01/30/24 0555 01/31/24 0441  Weight: 48.1 kg 48.1 kg 51 kg    Physical Exam  Gen:- Awake Alert, no acute distress, more coherent HEENT:- [place].AT, No sclera icterus Nose- [place] 3L/min Neck-Supple Neck,No JVD,.  Lungs-fair air movement, faint bibasilar rales CV- S1, S2 normal, regular  Abd-  +ve B.Sounds, Abd Soft, No tenderness,    Extremity/Skin:- No  edema, pedal pulses present on the Lt    Psych-affect is appropriate, oriented x3 Neuro-generalized weakness , no new focal deficits, no tremors MSK-right AKA stump Left hand with improving cap refill in fingers, left hand fingers less dusky appearing  - please see photos in epic   Data Reviewed: I have personally reviewed following labs and imaging studies  CBC: Recent Labs  Lab 01/28/24 1827 01/29/24 0326 01/30/24 0443 01/31/24 0353  WBC 34.8* 28.6* 28.7* 28.4*  NEUTROABS 30.2*  --   --   --   HGB 12.9 11.7* 11.3* 11.3*  HCT 46.8* 42.3 40.5  41.1  MCV 72.7* 72.7* 71.3* 71.2*  PLT 356 333 329 322   Basic Metabolic Panel: Recent Labs  Lab 01/28/24 1827 01/29/24 0326 01/30/24 0443 01/31/24 0353 02/01/24 0433  NA 136 136 135 137 137  K 4.5 4.1 3.8 3.9 3.7  CL 109 109 109 107 104  CO2 16* 16* 16* 22 24  GLUCOSE 101* 82 108* 100* 82  BUN 30* 31* 26* 22 27*  CREATININE 1.54* 1.35* 1.15* 1.15* 1.25*  CALCIUM 8.4* 8.2* 7.9* 7.6* 7.5*  MG  --  1.6*  --   --   --   PHOS  --  4.5  --   --  3.9   GFR: Estimated Creatinine Clearance: 32.8 mL/min (A) (by C-G formula based on SCr of 1.25 mg/dL (H)). Liver Function Tests: Recent Labs  Lab 01/28/24 1827 01/29/24 0326 02/01/24 0433  AST 20 18  --   ALT 12 11  --   ALKPHOS 90 84  --   BILITOT 0.8 0.7  --   PROT 6.1* 5.6*  --   ALBUMIN 2.9* 2.7* 2.3*   Recent Results (from the past 240 hours)  Resp panel by RT-PCR (RSV, Flu A&B, Covid) Anterior Nasal Swab     Status: None   Collection Time: 01/28/24  6:17 PM   Specimen: Anterior Nasal Swab  Result Value Ref Range Status   SARS Coronavirus 2 by RT PCR NEGATIVE NEGATIVE Final    Comment: (NOTE) SARS-CoV-2 target nucleic acids are NOT DETECTED.  The SARS-CoV-2 RNA is generally detectable in upper respiratory specimens during the acute phase of infection. The lowest concentration of SARS-CoV-2 viral copies this assay can detect is 138 copies/mL. A negative result does not preclude SARS-Cov-2 infection and should not be used as the sole basis for treatment or other patient management decisions. A negative result may occur with  improper specimen collection/handling, submission of specimen other than nasopharyngeal swab, presence of viral mutation(s) within the areas targeted by this assay, and inadequate number of viral copies(<138 copies/mL). A negative result must be combined with clinical observations, patient history, and epidemiological information. The expected result is Negative.  Fact Sheet for Patients:   BloggerCourse.com  Fact Sheet for Healthcare Providers:  SeriousBroker.it  This test is no t yet approved or cleared by the [place] FDA and  has been authorized for detection and/or diagnosis of SARS-CoV-2 by FDA under an Emergency Use Authorization (EUA). This EUA will remain  in effect (meaning  this test can be used) for the duration of the COVID-19 declaration under Section 564(b)(1) of the Act, 21 U.S.C.section 360bbb-3(b)(1), unless the authorization is terminated  or revoked sooner.       Influenza A by PCR NEGATIVE NEGATIVE Final   Influenza B by PCR NEGATIVE NEGATIVE Final    Comment: (NOTE) The Xpert Xpress SARS-CoV-2/FLU/RSV plus assay is intended as an aid in the diagnosis of influenza from Nasopharyngeal swab specimens and should not be used as a sole basis for treatment. Nasal washings and aspirates are unacceptable for Xpert Xpress SARS-CoV-2/FLU/RSV testing.  Fact Sheet for Patients: BloggerCourse.com  Fact Sheet for Healthcare Providers: SeriousBroker.it  This test is not yet approved or cleared by the [place] FDA and has been authorized for detection and/or diagnosis of SARS-CoV-2 by FDA under an Emergency Use Authorization (EUA). This EUA will remain in effect (meaning this test can be used) for the duration of the COVID-19 declaration under Section 564(b)(1) of the Act, 21 U.S.C. section 360bbb-3(b)(1), unless the authorization is terminated or revoked.     Resp Syncytial Virus by PCR NEGATIVE NEGATIVE Final    Comment: (NOTE) Fact Sheet for Patients: BloggerCourse.com  Fact Sheet for Healthcare Providers: SeriousBroker.it  This test is not yet approved or cleared by the [place] FDA and has been authorized for detection and/or diagnosis of SARS-CoV-2 by FDA under an Emergency Use  Authorization (EUA). This EUA will remain in effect (meaning this test can be used) for the duration of the COVID-19 declaration under Section 564(b)(1) of the Act, 21 U.S.C. section 360bbb-3(b)(1), unless the authorization is terminated or revoked.  Performed at [facility], [address] [place], [place] [address]   Blood culture (routine x 2)     Status: None (Preliminary result)   Collection Time: 01/28/24  7:52 PM   Specimen: Left Antecubital; Blood  Result Value Ref Range Status   Specimen Description LEFT ANTECUBITAL  Final   Special Requests NONE  Final   Culture   Final    NO GROWTH 4 DAYS Performed at [facility], [address], [place], [place] [address]    Report Status PENDING  Incomplete  Blood culture (routine x 2)     Status: None (Preliminary result)   Collection Time: 01/28/24  7:58 PM   Specimen: BLOOD LEFT FOREARM  Result Value Ref Range Status   Specimen Description BLOOD LEFT FOREARM  Final   Special Requests NONE  Final   Culture   Final    NO GROWTH 4 DAYS Performed at [facility], [address], [place], [place] [address]    Report Status PENDING  Incomplete  MRSA Next Gen by PCR, Nasal     Status: None   Collection Time: 01/29/24 12:08 PM   Specimen: Nasal Mucosa; Nasal Swab  Result Value Ref Range Status   MRSA by PCR Next Gen NOT DETECTED NOT DETECTED Final    Comment: (NOTE) The GeneXpert MRSA Assay (FDA approved for NASAL specimens only), is one component of a comprehensive MRSA colonization surveillance program. It is not intended to diagnose MRSA infection nor to guide or monitor treatment for MRSA infections. Test performance is not FDA approved in patients less than 20 years old. Performed at [facility], [address], [place], [place] [address]     Radiology Studies: DG Chest 2 View Result Date: 01/31/2024 CLINICAL DATA:  Dyspnea EXAM: CHEST - 2 VIEW COMPARISON:  01/28/2024 FINDINGS: Cardiac shadow is enlarged. Aortic  calcifications are noted.  The lungs are well aerated bilaterally. No focal infiltrate or effusion is noted. Interstitial opacities are again identified likely related to edema. No bony abnormality is noted. IMPRESSION: Persistent edema bilaterally but improved aeration in the right base. Electronically Signed   By: Alcide Clever M.D.   On: 01/31/2024 10:47   Scheduled Meds:  amLODipine  10 mg Oral Daily   apixaban  2.5 mg Oral BID   aspirin EC  81 mg Oral Q breakfast   Chlorhexidine Gluconate Cloth  6 each Topical Q0600   dapagliflozin propanediol  5 mg Oral Daily   dextromethorphan-guaiFENesin  1 tablet Oral BID   doxycycline  100 mg Oral Q12H   escitalopram  20 mg Oral Daily   ezetimibe  10 mg Oral Daily   fenofibrate  160 mg Oral Daily   flecainide  50 mg Oral BID   furosemide  40 mg Intravenous Daily   irbesartan  300 mg Oral Daily   pantoprazole  40 mg Oral Daily   Continuous Infusions:  cefTRIAXone (ROCEPHIN)  IV 2 g (02/01/24 0903)    LOS: 3 days   Shon Hale M.D on 02/01/2024 at 6:50 PM  Go to [email or web address] - for contact info  [facility] - Office  [phone]  If 7PM-7AM, please contact night-coverage [email or web address] 02/01/2024, 6:50 PM

## 2024-02-01 NOTE — Progress Notes (Addendum)
Villa Herb notified that pt's daughter says pt.unsafe to be home alone. Lelon Mast given pt's daughter Cary's phone number to call her.  Dr.Courage and Samantha notified of pt.removes oxygen and desats to 74% and of family concerns with pt.being alone. Per daughter pt. Seems more ''out of it'' than usual.

## 2024-02-01 NOTE — Progress Notes (Addendum)
SATURATION QUALIFICATIONS: (This note is used to comply with regulatory documentation for home oxygen)  Patient Saturations on Room Air at Rest = 74-77%%     Patient Saturations 93% 3L at rest.  Please briefly explain why patient needs home oxygen: Pt. Desats on room air.

## 2024-02-02 ENCOUNTER — Ambulatory Visit (HOSPITAL_COMMUNITY): Payer: 59 | Admitting: Internal Medicine

## 2024-02-02 ENCOUNTER — Inpatient Hospital Stay (HOSPITAL_COMMUNITY): Payer: 59

## 2024-02-02 DIAGNOSIS — I9589 Other hypotension: Secondary | ICD-10-CM | POA: Diagnosis not present

## 2024-02-02 DIAGNOSIS — I10 Essential (primary) hypertension: Secondary | ICD-10-CM | POA: Diagnosis not present

## 2024-02-02 DIAGNOSIS — E782 Mixed hyperlipidemia: Secondary | ICD-10-CM | POA: Diagnosis not present

## 2024-02-02 DIAGNOSIS — N1832 Chronic kidney disease, stage 3b: Secondary | ICD-10-CM | POA: Diagnosis not present

## 2024-02-02 LAB — CULTURE, BLOOD (ROUTINE X 2)
Culture: NO GROWTH
Culture: NO GROWTH

## 2024-02-02 LAB — RENAL FUNCTION PANEL
Albumin: 2.5 g/dL — ABNORMAL LOW (ref 3.5–5.0)
Anion gap: 10 (ref 5–15)
BUN: 26 mg/dL — ABNORMAL HIGH (ref 8–23)
CO2: 23 mmol/L (ref 22–32)
Calcium: 7.9 mg/dL — ABNORMAL LOW (ref 8.9–10.3)
Chloride: 104 mmol/L (ref 98–111)
Creatinine, Ser: 1.24 mg/dL — ABNORMAL HIGH (ref 0.44–1.00)
GFR, Estimated: 46 mL/min — ABNORMAL LOW (ref >60)
Glucose, Bld: 81 mg/dL (ref 70–99)
Phosphorus: 3.3 mg/dL (ref 2.5–4.6)
Potassium: 3.8 mmol/L (ref 3.5–5.1)
Sodium: 137 mmol/L (ref 135–145)

## 2024-02-02 MED ORDER — IRBESARTAN 300 MG PO TABS: 300.0000 mg | ORAL_TABLET | Freq: Every day | ORAL | 2 refills | Status: DC

## 2024-02-02 MED ORDER — FUROSEMIDE 40 MG PO TABS
40.0000 mg | ORAL_TABLET | Freq: Every day | ORAL | 11 refills | Status: DC
Start: 2024-02-02 — End: 2024-05-03

## 2024-02-02 MED ORDER — AMLODIPINE BESYLATE 10 MG PO TABS: 10.0000 mg | ORAL_TABLET | Freq: Every day | ORAL | 2 refills | Status: DC

## 2024-02-02 MED ORDER — CEFDINIR 300 MG PO CAPS: 300.0000 mg | ORAL_CAPSULE | Freq: Two times a day (BID) | ORAL | 0 refills | Status: AC

## 2024-02-02 MED ORDER — PANTOPRAZOLE SODIUM 40 MG PO TBEC: 40.0000 mg | DELAYED_RELEASE_TABLET | Freq: Every day | ORAL | 1 refills | Status: DC

## 2024-02-02 MED ORDER — DM-GUAIFENESIN ER 30-600 MG PO TB12: 1.0000 | ORAL_TABLET | Freq: Two times a day (BID) | ORAL | 0 refills | Status: AC

## 2024-02-02 MED ORDER — DOXYCYCLINE HYCLATE 100 MG PO TABS: 100.0000 mg | ORAL_TABLET | Freq: Two times a day (BID) | ORAL | 0 refills | Status: AC

## 2024-02-02 MED ORDER — DICLOFENAC SODIUM 1 % EX GEL: 2.0000 g | Freq: Four times a day (QID) | CUTANEOUS | Status: AC | PRN

## 2024-02-02 NOTE — TOC Transition Note (Signed)
Transition of Care [facility]) - Discharge Note   Patient Details  Name: [name] MRN: [id number] Date of Birth: [birth year]  Transition of Care [facility]) CM/SW Contact:  Villa Herb, LCSWA Phone Number: 02/02/2024, 11:54 AM   Clinical Narrative:    CSW updated that pt is medically stable for D/C home today. CSW updated that RN completed sat qual and MD ordered DME home O2. CSW spoke to Sunshine with [employer] who confirms they will work on referral and get tank delivered to hospital for pt. CSW updated Kandee Keen with [facility] of plans for D/C home today, they will follow in the community. HH orders placed by MD. TOC signing off.   Final next level of care: Home w Home Health Services Barriers to Discharge: Barriers Resolved   Patient Goals and CMS Choice Patient states their goals for this hospitalization and ongoing recovery are:: return home CMS Medicare.gov Compare Post Acute Care list provided to:: Patient Choice offered to / list presented to : Patient      Discharge Placement                       Discharge Plan and Services Additional resources added to the After Visit Summary for   In-house Referral: Clinical Social Work Discharge Planning Services: CM Consult Post Acute Care Choice: Home Health, Durable Medical Equipment          DME Arranged: Oxygen DME Agency: [employer] Date DME Agency Contacted: 02/02/24   Representative spoke with at DME Agency: Ian Malkin HH Arranged: PT, RN [facility] Agency: [facility] Date [facility] Agency Contacted: 02/02/24   Representative spoke with at [facility] Agency: Kandee Keen  Social Drivers of Health (SDOH) Interventions SDOH Screenings   Food Insecurity: No Food Insecurity (01/29/2024)  Housing: Low Risk  (01/29/2024)  Transportation Needs: No Transportation Needs (01/29/2024)  Utilities: Not At Risk (01/29/2024)  Alcohol Screen: Low Risk  (12/01/2023)  Depression (PHQ2-9): Low Risk  (12/01/2023)  Financial Resource Strain: Patient Declined (12/13/2023)   Physical Activity: Inactive (12/13/2023)  Social Connections: Unknown (01/29/2024)  Recent Concern: Social Connections - Moderately Isolated (12/01/2023)  Stress: No Stress Concern Present (12/13/2023)  Tobacco Use: Medium Risk (01/28/2024)  Health Literacy: Adequate Health Literacy (12/01/2023)     Readmission Risk Interventions    01/30/2024    3:07 PM  Readmission Risk Prevention Plan  Transportation Screening Complete  PCP or Specialist Appt within 3-5 Days Complete  HRI or Home Care Consult Complete  Social Work Consult for Recovery Care Planning/Counseling Complete  Palliative Care Screening Not Applicable  Medication Review Oceanographer) Complete

## 2024-02-02 NOTE — Plan of Care (Signed)
  Problem: Education: Goal: Knowledge of General Education information will improve Description: Including pain rating scale, medication(s)/side effects and non-pharmacologic comfort measures Outcome: Progressing   Problem: Nutrition: Goal: Adequate nutrition will be maintained Outcome: Progressing   Problem: Clinical Measurements: Goal: Respiratory complications will improve Outcome: Progressing   Problem: Clinical Measurements: Goal: Cardiovascular complication will be avoided Outcome: Progressing   Problem: Coping: Goal: Level of anxiety will decrease Outcome: Progressing   Problem: Pain Managment: Goal: General experience of comfort will improve and/or be controlled Outcome: Progressing   Problem: Safety: Goal: Ability to remain free from injury will improve Outcome: Progressing

## 2024-02-02 NOTE — Care Management Important Message (Signed)
Important Message  Patient Details  Name: [name] MRN: [id number] Date of Birth: 20-Mar-1951   Important Message Given:  Yes - Medicare IM     Corey Harold 02/02/2024, 4:44 PM

## 2024-02-02 NOTE — Discharge Summary (Signed)
Physician Discharge Summary   Patient: [name] MRN: [id number] DOB: 06-Jan-1951  Admit date:     01/28/2024  Discharge date: 02/02/24  Discharge Physician: Vassie Loll   PCP: Arrie Senate, FNP   Recommendations at discharge:  Repeat basic metabolic panel to follow electrolytes and renal function Reassess blood pressure and adjust medication as needed. Repeat CBC to follow hemoglobin trend. Make sure patient follow-up with hematologist for further evaluation/management of macrocytic anemia. Make sure patient follow-up with vascular surgery as instructed.  Discharge Diagnoses: Principal Problem:   Hypotension Active Problems:   Mixed hyperlipidemia   Leukocytosis   Essential hypertension   Microcytic anemia   Stage 3b chronic kidney disease (HCC)   Altered mental status, unspecified   Acute respiratory failure with hypoxia (HCC)   Vomiting   Symptomatic hypotension  Brief Hospital admission course: As per H&P written by Dr. Thomes Dinning on 01/28/2024 [name] is a 73 y.o. female with medical history significant of hypertension, hyperlipidemia, CAD, PVD with right AKA, chronic atrial fibrillation, polycythemia vera, prior stroke and MI who presents to the emergency department due to 3-week onset of increased confusion.  Patient complained of history of migraine headache which she does not have at this time.  Most of the history was obtained from daughter at bedside, per report, patient has had increased agitation and sometimes does not recognize family members.  She also complained of numbness on the body without being able to localize part of the body that is known.  It was unknown if patient was taking her medication correctly since she manages her medication.  Patient was apparently seen in the ED on 01/21/2024 for migraine, CT of head done at that time was negative and she was treated with Compazine and Benadryl for migraine with resolution.  She denies current headache  at this time, no recent falls was reported.   ED Course:  In the emergency department, she was hemodynamically on arrival to the ED, but subsequently, BP was in the hypotensive range at 81/63, other vital signs are within normal range.  O2 sat was also normal at 93% on arrival to the ED, but this subsequently decreased to 89%, supplemental oxygen via [place] was provided with improved oxygenation to 95-100%.  Workup in the ED showed WBC of 34.8, hematocrit 46.8, hemoglobin 12.9, MCV 72.7, platelets 356.  BMP was normal except for bicarb of 16, blood glucose 101, BUN to creatinine 30/1.54 (baseline creatinine at 1.3-1.6). Troponin 35 > 45, urinalysis was positive for proteinuria.  Lactic acid was normal.  Influenza A, B, SARS, respiratory, RSV was negative. Chest x-ray showed mild pulmonary vascular congestion with associated interstitial edema.  Mild bi-basilar atelectasis and/or infiltrate. Tylenol was given, IV LR 1 L was provided.  Hospitalist was asked to admit patient for for further evaluation and management.  Assessment and Plan: 1-acute hypoxic respiratory failure -Bronchitis/bronchiectasis concerning; patient empirically treated with ceftriaxone and doxycycline; at discharge will complete antibiotic therapy with Doxy and cefdinir. -Patient with pulmonary hypertension and CHF -Dyspnea improved and at discharge the saturation screening demonstrating the need for 2 L nasal cannula supplementation. -Patient has been discharged home on Lasix, amlodipine, Avapro and Farxiga. -Home oxygen arranged at discharge has been arranged -Patient advised to follow low-sodium diet and check weight on daily basis.  2-hypertension -Only in patient's left upper extremity associated with severe subclavian artery stenosis -Outpatient follow-up with vascular surgery recommended. -Asymptomatic.  3-severe stenosis of left subclavian artery -Case discussed with Dr. Karin Lieu (  vascular surgery); at the moment no  anticipated intervention with recommendation to pursued outpatient follow-up with the vascular surgery team. -Continue treatment with aspirin, Eliquis, fenofibrate, and Zetia. -Heart healthy diet discussed with patient.  4-acute anemia -Hemoglobin around 11.7 with a baseline around 12-13 -Workup suggesting iron deficiency/microcytic anemia -No acute bleeding appreciated -Stable hemoglobin levels at discharge -Outpatient follow-up with hematology recommended for further evaluation and management.  5-acute metabolic encephalopathy -Secondary to problem #1 -Mentation improved and mentation back to to baseline at discharge -Continue outpatient follow-up with PCP -Home health services arranged at discharge.  6-acute on chronic diastolic dysfunction -Echo demonstrating 60 to 65% ejection fraction, grade 1 diastolic dysfunction and severe asymmetric left ventricle hypertrophy.  2 L nasal cannula supplementation needed at discharge -Continue low-sodium diet, daily weights, adequate hydration and the use of Lasix at discharge.  7-chronic kidney disease stage IIIb -Stable and adequate at discharge -Repeat basic metabolic panel follow-up visit to assess stability/strain -Continue minimizing nephrotoxic agents and avoid dehydration, hypotension and contrast.  8-CT angiogram of the chest with concern for lymphoma Chest CT shows positive severe splenomegaly, that has progressed since 2023 and in association with new enlarging mediastinal lymph nodes and trace pleural effusion there is concern for lymphoma. -Outpatient follow-up with hematology has been recommended for workup for lymphoma or other lymphoproliferative disorder.  9-hypertension -Continue treatment as mentioned above with amlodipine, Lasix and Avapro -Low-sodium/heart healthy diet discussed with patient.  10-generalized weakness/deconditioning/AKA -Home health services arranged at discharge -Patient advised to maintain adequate  nutrition and hydration.  11-chronic paroxysmal atrial fibrillation -Continue treatment with Eliquis and flecainide -Continue patient follow-up with cardiology service as previously recommended -Rate controlled and stable at discharge.  Consultants: Oncology service curbside; vascular surgery curbside. Procedures performed: See below for x-ray reports. Disposition: Home Diet recommendation: Heart healthy/low-sodium diet.  DISCHARGE MEDICATION: Allergies as of 02/02/2024       Reactions   Atenolol Rash, Itching   Demerol  [meperidine Hcl] Itching   Demerol [meperidine] Itching, Other (See Comments)   Unknown, can't remember    Gabapentin Anxiety, Other (See Comments)   SI and HI   Inderal [propranolol] Other (See Comments), Itching   Doesn't remember    Prednisone Other (See Comments)   Muscle spasms   Statins Itching, Other (See Comments)   Simvastatin - caused severe itching Pravastatin - caused lesser tiching One type of statin? Caused stroke in 2006, and other symptoms as result   Warfarin And Related Other (See Comments)   Caused nose bleeds   Crestor [rosuvastatin] Itching, Rash   Docosahexaenoic Acid (dha) (fish)    nosebleeds   Lactose Intolerance (gi) Other (See Comments)   Bloating, gas   Lipitor [atorvastatin] Rash   Lovaza [omega-3-acid Ethyl Esters (fish)] Other (See Comments)   nosebleeds   Penicillins Hives, Rash, Other (See Comments)   Immediate rash, facial/tongue/throat swelling, SOB or lightheadedness with hypotension Questionable high fever    Pravastatin Itching, Rash   Simvastatin Itching, Rash, Other (See Comments)   Trilipix [choline Fenofibrate] Other (See Comments)   Doesn't remember    Warfarin Other (See Comments)   nosebleeds   Hydralazine Swelling   Effexor [venlafaxine Hcl] Other (See Comments)   Doesn't remember    Nexium [esomeprazole Magnesium] Rash   Percocet [oxycodone-acetaminophen] Other (See Comments)   Doesn't remember     Plavix [clopidogrel Bisulfate] Rash        Medication List     STOP taking these medications    diltiazem 240  MG 24 hr capsule Commonly known as: CARDIZEM CD   omeprazole 20 MG capsule Commonly known as: PRILOSEC Replaced by: pantoprazole 40 MG tablet   valsartan 160 MG tablet Commonly known as: DIOVAN Replaced by: irbesartan 300 MG tablet   valsartan 320 MG tablet Commonly known as: DIOVAN       TAKE these medications    acetaminophen 500 MG tablet Commonly known as: TYLENOL Take 2 tablets (1,000 mg total) by mouth every 6 (six) hours. What changed:  when to take this reasons to take this   amLODipine 10 MG tablet Commonly known as: NORVASC Take 1 tablet (10 mg total) by mouth daily. Start taking on: February 03, 2024   apixaban 2.5 MG Tabs tablet Commonly known as: ELIQUIS Take 1 tablet (2.5 mg total) by mouth 2 (two) times daily.   aspirin EC 325 MG tablet Take 325 mg by mouth daily.   calcitRIOL 0.25 MCG capsule Commonly known as: ROCALTROL Take 0.25 mcg by mouth 2 (two) times a week.   cefdinir 300 MG capsule Commonly known as: OMNICEF Take 1 capsule (300 mg total) by mouth 2 (two) times daily for 4 days.   dextromethorphan-guaiFENesin 30-600 MG 12hr tablet Commonly known as: MUCINEX DM Take 1 tablet by mouth 2 (two) times daily for 8 days.   diclofenac Sodium 1 % Gel Commonly known as: VOLTAREN Apply 2 g topically 4 (four) times daily as needed (pain).   doxycycline 100 MG tablet Commonly known as: VIBRA-TABS Take 1 tablet (100 mg total) by mouth every 12 (twelve) hours for 4 days.   escitalopram 20 MG tablet Commonly known as: LEXAPRO TAKE ONE TABLET ONCE DAILY. NEEDS TO BE SEEN FOR REFILLS   ezetimibe 10 MG tablet Commonly known as: ZETIA Take 1 tablet (10 mg total) by mouth daily.   Farxiga 5 MG Tabs tablet Generic drug: dapagliflozin propanediol Take 5 mg by mouth daily.   fenofibrate 145 MG tablet Commonly known as:  TRICOR Take 1 tablet (145 mg total) by mouth daily.   flecainide 50 MG tablet Commonly known as: TAMBOCOR TAKE ONE TABLET TWICE DAILY   furosemide 40 MG tablet Commonly known as: Lasix Take 1 tablet (40 mg total) by mouth daily.   HYDROcodone-acetaminophen 5-325 MG tablet Commonly known as: NORCO/VICODIN Take 1 tablet by mouth 2 (two) times daily as needed.   irbesartan 300 MG tablet Commonly known as: AVAPRO Take 1 tablet (300 mg total) by mouth daily. Start taking on: February 03, 2024 Replaces: valsartan 160 MG tablet   pantoprazole 40 MG tablet Commonly known as: PROTONIX Take 1 tablet (40 mg total) by mouth daily. Start taking on: February 03, 2024 Replaces: omeprazole 20 MG capsule               Durable Medical Equipment  (From admission, onward)           Start     Ordered   02/02/24 1111  For home use only DME oxygen  Once       Question Answer Comment  Length of Need 12 Months   Mode or (Route) Nasal cannula   Liters per Minute 2   Frequency Continuous (stationary and portable oxygen unit needed)   Oxygen conserving device Yes   Oxygen delivery system Gas      02/02/24 1110            Follow-up Information     Victorino Sparrow, MD. Schedule an appointment as soon as possible for a  visit in 1 week(s).   Specialty: Vascular Surgery Why: Lt subclavian artery stenosis Contact information: [address] [place] [address] [phone]         Doreatha Massed, MD. Schedule an appointment as soon as possible for a visit in 3 week(s).   Specialty: Hematology Why: workup for possible lymphoma or other Lymphoproliferative disorder. Contact information: [address] [place] [address] [phone]         Arrie Senate, FNP. Schedule an appointment as soon as possible for a visit in 10 day(s).   Specialty: Family Medicine Contact information: [address] [place] [address] [phone]                 Discharge Exam: Ceasar Mons Weights   01/31/24 0441 02/01/24 1926 02/02/24 0401  Weight: 51 kg 51.6 kg 51.6 kg   General exam: Alert, awake; in no acute distress and feeling ready to go home.  2 L nasal cannula supplementation in place. Respiratory system: Improved air movement bilaterally; no wheezing, no crackles, no using accessory muscles. Cardiovascular system: Rate rate controlled, no rubs, no gallops, no JVD. Gastrointestinal system: Abdomen is nondistended, soft and nontender. No organomegaly or masses felt. Normal bowel sounds heard. Central nervous system: No focal neurological deficits. Extremities: No cyanosis or clubbing; right AKA appreciated. Skin: No petechiae. Psychiatry:  Mood & affect appropriate.    Condition at discharge: stable and improved.  The results of significant diagnostics from this hospitalization (including imaging, microbiology, ancillary and laboratory) are listed below for reference.   Imaging Studies: DG Chest 2 View Result Date: 02/02/2024 CLINICAL DATA:  Dyspnea and weakness EXAM: CHEST - 2 VIEW COMPARISON:  01/31/2024 FINDINGS: Cardiac shadow is enlarged but stable. Lungs are well aerated bilaterally. Mild vascular congestion is noted. Improved parenchymal edema is noted. No acute bony abnormality is seen. IMPRESSION: Improvement of previously seen parenchymal edema. Residual vascular congestion is noted Electronically Signed   By: Alcide Clever M.D.   On: 02/02/2024 10:16   DG Chest 2 View Result Date: 01/31/2024 CLINICAL DATA:  Dyspnea EXAM: CHEST - 2 VIEW COMPARISON:  01/28/2024 FINDINGS: Cardiac shadow is enlarged. Aortic calcifications are noted. The lungs are well aerated bilaterally. No focal infiltrate or effusion is noted. Interstitial opacities are again identified likely related to edema. No bony abnormality is noted. IMPRESSION: Persistent edema bilaterally but improved aeration in the right base. Electronically Signed   By: Alcide Clever  M.D.   On: 01/31/2024 10:47   ECHOCARDIOGRAM COMPLETE Result Date: 01/30/2024    ECHOCARDIOGRAM REPORT   Patient Name:   ARIKA MAINER Date of Exam: 01/30/2024 Medical Rec #:  [id number]      Height:       65.0 in Accession #:    [id number]     Weight:       106.0 lb Date of Birth:  1951-01-21       BSA:          1.511 m Patient Age:    72 years       BP:           93/63 mmHg Patient Gender: F              HR:           90 bpm. [facility] Procedure: 2D Echo, Cardiac Doppler and Color Doppler Indications:     CHF-Acute Systolic I50.21  History:         Patient  has prior history of Echocardiogram examinations, most                  recent 11/04/2021. CAD and Previous Myocardial Infarction,                  Stroke; Risk Factors:Hypertension.  Sonographer:     Darlys Gales Referring Phys:  [id number] OLADAPO ADEFESO Diagnosing Phys: Winfield Rast Mallipeddi IMPRESSIONS  1. Left ventricular ejection fraction, by estimation, is 60 to 65%. The left ventricle has normal function. Left ventricular endocardial border not optimally defined to evaluate regional wall motion. There is severe asymmetric left ventricular hypertrophy of the septal and basal segments. Left ventricular diastolic parameters are consistent with Grade I diastolic dysfunction (impaired relaxation). Elevated left ventricular end-diastolic pressure.  2. Right ventricular systolic function is normal. The right ventricular size is normal. Mildly increased right ventricular wall thickness. Tricuspid regurgitation signal is inadequate for assessing PA pressure.  3. Left atrial size was mildly dilated.  4. The mitral valve is abnormal. Trivial mitral valve regurgitation. Moderate mitral stenosis. The mean mitral valve gradient is 7.3 mmHg. Severe mitral annular calcification.  5. The aortic valve was not well visualized. Aortic valve regurgitation is not visualized. No aortic stenosis is present.  6. The inferior vena cava is normal in size with  greater than 50% respiratory variability, suggesting right atrial pressure of 3 mmHg. Comparison(s): Changes from prior study are noted. MS is moderate now. FINDINGS  Left Ventricle: Left ventricular ejection fraction, by estimation, is 60 to 65%. The left ventricle has normal function. Left ventricular endocardial border not optimally defined to evaluate regional wall motion. The left ventricular internal cavity size was normal in size. There is severe asymmetric left ventricular hypertrophy of the septal and basal segments. Left ventricular diastolic parameters are consistent with Grade I diastolic dysfunction (impaired relaxation). Elevated left ventricular end-diastolic pressure. Right Ventricle: The right ventricular size is normal. Mildly increased right ventricular wall thickness. Right ventricular systolic function is normal. Tricuspid regurgitation signal is inadequate for assessing PA pressure. Left Atrium: Left atrial size was mildly dilated. Right Atrium: Right atrial size was normal in size. Pericardium: There is no evidence of pericardial effusion. Mitral Valve: The mitral valve is abnormal. Severe mitral annular calcification. Trivial mitral valve regurgitation. Moderate mitral valve stenosis. MV peak gradient, 13.5 mmHg. The mean mitral valve gradient is 7.3 mmHg. Tricuspid Valve: The tricuspid valve is grossly normal. Tricuspid valve regurgitation is not demonstrated. No evidence of tricuspid stenosis. Aortic Valve: The aortic valve was not well visualized. Aortic valve regurgitation is not visualized. No aortic stenosis is present. Aortic valve mean gradient measures 5.0 mmHg. Aortic valve peak gradient measures 7.6 mmHg. Aortic valve area, by VTI measures 2.26 cm. Pulmonic Valve: The pulmonic valve was not well visualized. Pulmonic valve regurgitation is not visualized. No evidence of pulmonic stenosis. Aorta: The aortic root is normal in size and structure. Venous: The inferior vena cava is  normal in size with greater than 50% respiratory variability, suggesting right atrial pressure of 3 mmHg. IAS/Shunts: No atrial level shunt detected by color flow Doppler.  LEFT VENTRICLE PLAX 2D LVIDd:         3.70 cm   Diastology LVIDs:         2.50 cm   LV e' medial:    6.20 cm/s LV PW:         1.10 cm   LV E/e' medial:  18.2 LV IVS:  1.70 cm   LV e' lateral:   7.72 cm/s LVOT diam:     1.80 cm   LV E/e' lateral: 14.6 LV SV:         57 LV SV Index:   38 LVOT Area:     2.54 cm  RIGHT VENTRICLE RV S prime:     18.00 cm/s TAPSE (M-mode): 1.8 cm LEFT ATRIUM             Index        RIGHT ATRIUM           Index LA Vol (A2C):   70.9 ml 46.94 ml/m  RA Area:     13.60 cm LA Vol (A4C):   35.7 ml 23.63 ml/m  RA Volume:   30.40 ml  20.12 ml/m LA Biplane Vol: 52.4 ml 34.69 ml/m  AORTIC VALVE AV Area (Vmax):    2.60 cm AV Area (Vmean):   2.06 cm AV Area (VTI):     2.26 cm AV Vmax:           138.00 cm/s AV Vmean:          106.000 cm/s AV VTI:            0.252 m AV Peak Grad:      7.6 mmHg AV Mean Grad:      5.0 mmHg LVOT Vmax:         141.00 cm/s LVOT Vmean:        86.000 cm/s LVOT VTI:          0.224 m LVOT/AV VTI ratio: 0.89  AORTA Ao Root diam: 2.70 cm MITRAL VALVE MV Area (PHT): 2.62 cm     SHUNTS MV Area VTI:   1.50 cm     Systemic VTI:  0.22 m MV Peak grad:  13.5 mmHg    Systemic Diam: 1.80 cm MV Mean grad:  7.3 mmHg MV Vmax:       1.84 m/s MV Vmean:      126.0 cm/s MV Decel Time: 290 msec MV E velocity: 113.00 cm/s MV A velocity: 147.00 cm/s MV E/A ratio:  0.77 Vishnu Priya Mallipeddi Electronically signed by Winfield Rast Mallipeddi Signature Date/Time: 01/30/2024/2:44:30 PM    Final (Updated)    CT Angio Chest Pulmonary Embolism (PE) W or WO Contrast Result Date: 01/30/2024 CLINICAL DATA:  73 year old female with shortness of breath, weakness. History of advanced atherosclerosis, severe left subclavian artery stenosis in 2022. EXAM: CT ANGIOGRAPHY CHEST WITH CONTRAST TECHNIQUE: Multidetector CT imaging  of the chest was performed using the standard protocol during bolus administration of intravenous contrast. Multiplanar CT image reconstructions and MIPs were obtained to evaluate the vascular anatomy. RADIATION DOSE REDUCTION: This exam was performed according to the departmental dose-optimization program which includes automated exposure control, adjustment of the mA and/or kV according to patient size and/or use of iterative reconstruction technique. CONTRAST:  75mL OMNIPAQUE IOHEXOL 350 MG/ML SOLN COMPARISON:  CTA head and neck 11/04/2021.  CTA chest 09/09/2018. FINDINGS: Cardiovascular: Excellent contrast bolus timing in the pulmonary arterial tree. Mild to moderate central pulmonary artery enlargement (series 5, image 123), more conspicuous than in 2019. Mild respiratory motion. No pulmonary artery filling defect identified. Advanced Calcified aortic atherosclerosis. Advanced calcified coronary artery atherosclerosis. Negative for thoracic aortic aneurysm or dissection. Great vessel patency not well evaluated due to contrast timing, but diffuse proximal left subclavian artery atherosclerosis persists. Cardiomegaly appears increased since 2019.  No pericardial effusion. Mediastinum/Nodes: Enlarged mediastinal lymph nodes since 2019,  with an almost 2 cm anterior carina lymph node now on series 4, image 32. No obvious lymphadenopathy at the thoracic inlet. No axillary lymphadenopathy. Lungs/Pleura: Trace layering pleural effusions. Lower lung volumes compared to 2019. Generalized pulmonary ground-glass opacity without septal thickening, probably atelectasis. Major airways remain patent. No consolidation. No pulmonary mass or nodule identified. Upper Abdomen: Marked splenomegaly appears progressed from the most recent CT abdomen and 2023, splenic length greater than 16 cm. Visible splenic parenchyma is homogeneous. Negative visible liver. Cholelithiasis. No visible pericholecystic inflammation. Partial pancreatic  atrophy. Partial renal atrophy. Adrenal glands and visible bowel remain within normal limits. No upper abdominal free air or free fluid. Musculoskeletal: Chronic mild T4 compression fracture. No acute or suspicious osseous lesion identified. Chronic left glenoid degeneration. Review of the MIP images confirms the above findings. IMPRESSION: 1. Negative for acute pulmonary embolus. Central pulmonary artery enlargement suspicious for pulmonary artery hypertension. 2. Positive for Severe Splenomegaly, progressed since 2023 and in association with new enlarged mediastinal lymph nodes and trace pleural effusions. Consider Lymphoma or other Lymphoproliferative disorder. 3. Severe atherosclerosis, Aortic Atherosclerosis (ICD10-I70.0). Cardiomegaly. Severe left subclavian artery stenosis in 2022, not re-evaluated due to the contrast phase of this exam. 4. Cholelithiasis. Electronically Signed   By: Odessa Fleming M.D.   On: 01/30/2024 12:31   DG Chest 2 View Result Date: 01/28/2024 CLINICAL DATA:  Generalized weakness and mood changes. EXAM: CHEST - 2 VIEW COMPARISON:  November 27, 2022 FINDINGS: The heart size and mediastinal contours are within normal limits. There is marked severity calcification of the aortic arch. There is prominence of the central pulmonary vasculature with mild diffusely increased interstitial lung markings. Mild to moderate severity areas of superimposed atelectasis and/or infiltrate are seen within the bilateral lung bases. No pleural effusion or pneumothorax is identified. Multilevel degenerative changes are seen throughout the thoracic spine. IMPRESSION: 1. Mild pulmonary vascular congestion with associated interstitial edema. 2. Mild bibasilar atelectasis and/or infiltrate. Electronically Signed   By: Aram Candela M.D.   On: 01/28/2024 19:49   CT Head Wo Contrast Result Date: 01/21/2024 CLINICAL DATA:  Headache, increasing frequency or severity EXAM: CT HEAD WITHOUT CONTRAST TECHNIQUE:  Contiguous axial images were obtained from the base of the skull through the vertex without intravenous contrast. RADIATION DOSE REDUCTION: This exam was performed according to the departmental dose-optimization program which includes automated exposure control, adjustment of the mA and/or kV according to patient size and/or use of iterative reconstruction technique. COMPARISON:  CT head November 03, 2021. FINDINGS: Brain: No evidence of acute infarction, hemorrhage, hydrocephalus, extra-axial collection or mass lesion/mass effect. Patchy white matter hypodensities, compatible with chronic microvascular ischemic disease. Small remote right caudate lacunar infarct. Vascular: Calcific atherosclerosis.  No hyperdense vessel. Skull: No acute fracture. Sinuses/Orbits: Clear sinuses.  No acute orbital findings. Other: No mastoid effusions. IMPRESSION: No evidence of acute intracranial abnormality. Electronically Signed   By: Feliberto Harts M.D.   On: 01/21/2024 23:57    Microbiology: Results for orders placed or performed during the hospital encounter of 01/28/24  Resp panel by RT-PCR (RSV, Flu A&B, Covid) Anterior Nasal Swab     Status: None   Collection Time: 01/28/24  6:17 PM   Specimen: Anterior Nasal Swab  Result Value Ref Range Status   SARS Coronavirus 2 by RT PCR NEGATIVE NEGATIVE Final    Comment: (NOTE) SARS-CoV-2 target nucleic acids are NOT DETECTED.  The SARS-CoV-2 RNA is generally detectable in upper respiratory specimens during the acute phase of infection. The  lowest concentration of SARS-CoV-2 viral copies this assay can detect is 138 copies/mL. A negative result does not preclude SARS-Cov-2 infection and should not be used as the sole basis for treatment or other patient management decisions. A negative result may occur with  improper specimen collection/handling, submission of specimen other than nasopharyngeal swab, presence of viral mutation(s) within the areas targeted by this  assay, and inadequate number of viral copies(<138 copies/mL). A negative result must be combined with clinical observations, patient history, and epidemiological information. The expected result is Negative.  Fact Sheet for Patients:  BloggerCourse.com  Fact Sheet for Healthcare Providers:  SeriousBroker.it  This test is no t yet approved or cleared by the [place] FDA and  has been authorized for detection and/or diagnosis of SARS-CoV-2 by FDA under an Emergency Use Authorization (EUA). This EUA will remain  in effect (meaning this test can be used) for the duration of the COVID-19 declaration under Section 564(b)(1) of the Act, 21 U.S.C.section 360bbb-3(b)(1), unless the authorization is terminated  or revoked sooner.       Influenza A by PCR NEGATIVE NEGATIVE Final   Influenza B by PCR NEGATIVE NEGATIVE Final    Comment: (NOTE) The Xpert Xpress SARS-CoV-2/FLU/RSV plus assay is intended as an aid in the diagnosis of influenza from Nasopharyngeal swab specimens and should not be used as a sole basis for treatment. Nasal washings and aspirates are unacceptable for Xpert Xpress SARS-CoV-2/FLU/RSV testing.  Fact Sheet for Patients: BloggerCourse.com  Fact Sheet for Healthcare Providers: SeriousBroker.it  This test is not yet approved or cleared by the [place] FDA and has been authorized for detection and/or diagnosis of SARS-CoV-2 by FDA under an Emergency Use Authorization (EUA). This EUA will remain in effect (meaning this test can be used) for the duration of the COVID-19 declaration under Section 564(b)(1) of the Act, 21 U.S.C. section 360bbb-3(b)(1), unless the authorization is terminated or revoked.     Resp Syncytial Virus by PCR NEGATIVE NEGATIVE Final    Comment: (NOTE) Fact Sheet for Patients: BloggerCourse.com  Fact Sheet for  Healthcare Providers: SeriousBroker.it  This test is not yet approved or cleared by the [place] FDA and has been authorized for detection and/or diagnosis of SARS-CoV-2 by FDA under an Emergency Use Authorization (EUA). This EUA will remain in effect (meaning this test can be used) for the duration of the COVID-19 declaration under Section 564(b)(1) of the Act, 21 U.S.C. section 360bbb-3(b)(1), unless the authorization is terminated or revoked.  Performed at [facility], [address], [place], [place] [address]   Blood culture (routine x 2)     Status: None   Collection Time: 01/28/24  7:52 PM   Specimen: Left Antecubital; Blood  Result Value Ref Range Status   Specimen Description LEFT ANTECUBITAL  Final   Special Requests NONE  Final   Culture   Final    NO GROWTH 5 DAYS Performed at [facility], [address], [place], [place] [address]    Report Status 02/02/2024 FINAL  Final  Blood culture (routine x 2)     Status: None   Collection Time: 01/28/24  7:58 PM   Specimen: BLOOD LEFT FOREARM  Result Value Ref Range Status   Specimen Description BLOOD LEFT FOREARM  Final   Special Requests NONE  Final   Culture   Final    NO GROWTH 5 DAYS Performed at [facility], [address], [place], [place] [address]    Report Status 02/02/2024 FINAL  Final  MRSA Next Gen by PCR, Nasal     Status: None   Collection Time: 01/29/24 12:08 PM   Specimen: Nasal Mucosa; Nasal Swab  Result Value Ref Range Status   MRSA by PCR Next Gen NOT DETECTED NOT DETECTED Final    Comment: (NOTE) The GeneXpert MRSA Assay (FDA approved for NASAL specimens only), is one component of a comprehensive MRSA colonization surveillance program. It is not intended to diagnose MRSA infection nor to guide or monitor treatment for MRSA infections. Test performance is not FDA approved in patients less than 39 years old. Performed at [facility], [address], [place], [place]  [address]     Labs: CBC: Recent Labs  Lab 01/28/24 1827 01/29/24 0326 01/30/24 0443 01/31/24 0353  WBC 34.8* 28.6* 28.7* 28.4*  NEUTROABS 30.2*  --   --   --   HGB 12.9 11.7* 11.3* 11.3*  HCT 46.8* 42.3 40.5 41.1  MCV 72.7* 72.7* 71.3* 71.2*  PLT 356 333 329 322   Basic Metabolic Panel: Recent Labs  Lab 01/29/24 0326 01/30/24 0443 01/31/24 0353 02/01/24 0433 02/02/24 0427  NA 136 135 137 137 137  K 4.1 3.8 3.9 3.7 3.8  CL 109 109 107 104 104  CO2 16* 16* 22 24 23   GLUCOSE 82 108* 100* 82 81  BUN 31* 26* 22 27* 26*  CREATININE 1.35* 1.15* 1.15* 1.25* 1.24*  CALCIUM 8.2* 7.9* 7.6* 7.5* 7.9*  MG 1.6*  --   --   --   --   PHOS 4.5  --   --  3.9 3.3   Liver Function Tests: Recent Labs  Lab 01/28/24 1827 01/29/24 0326 02/01/24 0433 02/02/24 0427  AST 20 18  --   --   ALT 12 11  --   --   ALKPHOS 90 84  --   --   BILITOT 0.8 0.7  --   --   PROT 6.1* 5.6*  --   --   ALBUMIN 2.9* 2.7* 2.3* 2.5*   CBG: Recent Labs  Lab 01/28/24 1852 01/28/24 1922  GLUCAP 80 70    Discharge time spent: greater than 30 minutes.  Signed: Vassie Loll, MD [facility] 02/02/2024

## 2024-02-02 NOTE — Progress Notes (Signed)
SATURATION QUALIFICATIONS: (This note is used to comply with regulatory documentation for home oxygen)  Patient Saturations on Room Air at Rest = 85%  Patient Saturations on Room Air while Standing = 84%  Patient Saturations on 2 Liters of oxygen while Standing = 93%  Please briefly explain why patient needs home oxygen:

## 2024-02-02 NOTE — Plan of Care (Signed)

## 2024-02-03 ENCOUNTER — Telehealth: Payer: Self-pay

## 2024-02-03 NOTE — Transitions of Care (Post Inpatient/ED Visit) (Signed)
   02/03/2024  Name: [name] MRN: [id number] DOB: 1951-10-24  Today's TOC FU Call Status: Today's TOC FU Call Status:: Unsuccessful Call (1st Attempt) Unsuccessful Call (1st Attempt) Date: 02/03/24  Attempted to reach the patient regarding the most recent Inpatient/ED visit.  Follow Up Plan: Additional outreach attempts will be made to reach the patient to complete the Transitions of Care (Post Inpatient/ED visit) call.   Signature Karena Addison, LPN [facility] Nurse Health Advisor Direct Dial [phone]

## 2024-02-04 ENCOUNTER — Encounter (HOSPITAL_COMMUNITY): Payer: Self-pay | Admitting: Radiology

## 2024-02-04 ENCOUNTER — Ambulatory Visit (HOSPITAL_COMMUNITY): Payer: 59 | Attending: Cardiology

## 2024-02-04 ENCOUNTER — Encounter (HOSPITAL_COMMUNITY): Payer: Self-pay

## 2024-02-04 NOTE — Progress Notes (Signed)
Patient arrived for echocardiogram today. Patient had an echocardiogram performed in the hospital on 01/26/24. Echocardiogram appointment was cancelled. Echo will be rescheduled if clinically needed.

## 2024-02-07 ENCOUNTER — Telehealth: Payer: Self-pay | Admitting: Family Medicine

## 2024-02-07 NOTE — Transitions of Care (Post Inpatient/ED Visit) (Signed)
   02/07/2024  Name: [name] MRN: [id number] DOB: 01/31/1951  Today's TOC FU Call Status: Today's TOC FU Call Status:: Unsuccessful Call (2nd Attempt) Unsuccessful Call (1st Attempt) Date: 02/03/24 Unsuccessful Call (2nd Attempt) Date: 02/07/24  Attempted to reach the patient regarding the most recent Inpatient/ED visit.  Follow Up Plan: Additional outreach attempts will be made to reach the patient to complete the Transitions of Care (Post Inpatient/ED visit) call.   Signature Karena Addison, LPN [facility] Nurse Health Advisor Direct Dial [phone]

## 2024-02-07 NOTE — Telephone Encounter (Unsigned)
Copied from CRM [id number]. Topic: Referral - Question >> Feb 07, 2024  1:50 PM Shelah Lewandowsky wrote: Reason for CRM: Archie Patten with [facility] home requesting extension for home health nurse 2x a week for 6 weeks please call [phone] has confidential voicemail

## 2024-02-07 NOTE — Transitions of Care (Post Inpatient/ED Visit) (Signed)
02/07/2024  Name: [name] MRN: [id number] DOB: [birth year]  Today's TOC FU Call Status: Today's TOC FU Call Status:: Successful TOC FU Call Completed Unsuccessful Call (1st Attempt) Date: 02/03/24 Unsuccessful Call (2nd Attempt) Date: 02/07/24 [facility] FU Call Complete Date: 02/07/24 Patient's Name and Date of Birth confirmed.  Transition Care Management Follow-up Telephone Call Date of Discharge: 02/02/24 [facility] Type of Discharge: Inpatient Admission Primary Inpatient Discharge Diagnosis:: hypotension How have you been since you were released from the hospital?: Same  Items Reviewed: Did you receive and understand the discharge instructions provided?: Yes Medications obtained,verified, and reconciled?: Yes (Medications Reviewed) Any new allergies since your discharge?: No Dietary orders reviewed?: Yes Do you have support at home?: Yes People in Home: grandchild(ren)  Medications Reviewed Today: Medications Reviewed Today     Reviewed by Karena Addison, LPN (Licensed Practical Nurse) on 02/07/24 at 1542  Med List Status: <None>   Medication Order Taking? Sig Documenting Provider Last Dose Status Informant  acetaminophen (TYLENOL) 500 MG tablet [id number] No Take 2 tablets (1,000 mg total) by mouth every 6 (six) hours.  Patient taking differently: Take 1,000 mg by mouth every 6 (six) hours as needed for mild pain (pain score 1-3).   Juliet Rude, [place] 01/27/2024 Active Self  amLODipine (NORVASC) 10 MG tablet [id number]  Take 1 tablet (10 mg total) by mouth daily. Vassie Loll, MD  Active   apixaban [facility]) 2.5 MG TABS tablet [id number] No Take 1 tablet (2.5 mg total) by mouth 2 (two) times daily. Reather Littler D, NP 01/28/2024 11:00 AM Active Self  aspirin EC 325 MG tablet [id number] No Take 325 mg by mouth daily. [provider] 01/28/2024 11:00 AM Active Self  calcitRIOL (ROCALTROL) 0.25 MCG capsule [id number] No Take 0.25 mcg by mouth 2  (two) times a week. [provider] 01/27/2024 Active Self  dextromethorphan-guaiFENesin (MUCINEX DM) 30-600 MG 12hr tablet [id number]  Take 1 tablet by mouth 2 (two) times daily for 8 days. Vassie Loll, MD  Active   diclofenac Sodium (VOLTAREN) 1 % GEL [id number]  Apply 2 g topically 4 (four) times daily as needed (pain). Vassie Loll, MD  Active   escitalopram (LEXAPRO) 20 MG tablet [id number] No TAKE ONE TABLET ONCE DAILY. NEEDS TO BE SEEN FOR REFILLS Arrie Senate, FNP 01/28/2024 Morning Active Self  ezetimibe (ZETIA) 10 MG tablet [id number] No Take 1 tablet (10 mg total) by mouth daily. Reather Littler D, NP 01/28/2024 Morning Active Self  FARXIGA 5 MG TABS tablet [id number] No Take 5 mg by mouth daily. [provider] 01/28/2024 Morning Active Self  fenofibrate (TRICOR) 145 MG tablet [id number] No Take 1 tablet (145 mg total) by mouth daily. Reather Littler D, NP 01/28/2024 Morning Active Self  flecainide (TAMBOCOR) 50 MG tablet [id number] No TAKE ONE TABLET TWICE DAILY Arrie Senate, FNP 01/28/2024 Morning Active Self  furosemide (LASIX) 40 MG tablet [id number]  Take 1 tablet (40 mg total) by mouth daily. Vassie Loll, MD  Active   HYDROcodone-acetaminophen (NORCO/VICODIN) 5-325 MG tablet [id number] No Take 1 tablet by mouth 2 (two) times daily as needed. [provider] Past Week Active Self  irbesartan (AVAPRO) 300 MG tablet [id number]  Take 1 tablet (300 mg total) by mouth daily. Vassie Loll, MD  Active   pantoprazole (PROTONIX) 40 MG tablet [id number]  Take 1 tablet (40 mg total) by mouth daily. Vassie Loll, MD  Active  Home Care and Equipment/Supplies: Were Home Health Services Ordered?: Yes [facility] of [facility] Health Agency[email or web address] Has Agency set up a time to come to your home?: Yes First Home Health Visit Date: 02/07/24 Any new equipment or medical supplies ordered?: NA  Functional Questionnaire: Do you need assistance with  bathing/showering or dressing?: Yes Do you need assistance with meal preparation?: Yes Do you need assistance with eating?: No Do you have difficulty maintaining continence: No Do you need assistance with getting out of bed/getting out of a chair/moving?: No Do you have difficulty managing or taking your medications?: Yes  Follow up appointments reviewed: PCP Follow-up appointment confirmed?: Yes Date of PCP follow-up appointment?: 02/11/24 Follow-up Provider: [facility] Follow-up appointment confirmed?: Yes Date of Specialist follow-up appointment?: 02/03/24 Follow-Up Specialty Provider:: cardio Do you need transportation to your follow-up appointment?: No Do you understand care options if your condition(s) worsen?: Yes-patient verbalized understanding    SIGNATURE Karena Addison, LPN [facility] Nurse Health Advisor Direct Dial [phone]

## 2024-02-08 NOTE — Telephone Encounter (Signed)
TC back to [facility] VO given to extend [facility] nursing

## 2024-02-09 ENCOUNTER — Ambulatory Visit: Payer: 59 | Admitting: Physician Assistant

## 2024-02-11 ENCOUNTER — Inpatient Hospital Stay: Payer: 59 | Admitting: Family Medicine

## 2024-02-15 ENCOUNTER — Ambulatory Visit (HOSPITAL_COMMUNITY): Payer: 59 | Admitting: Internal Medicine

## 2024-02-15 ENCOUNTER — Encounter (HOSPITAL_COMMUNITY): Payer: Self-pay

## 2024-02-15 NOTE — Progress Notes (Incomplete)
Primary Care Physician: Arrie Senate, FNP Primary Cardiologist: Nanetta Batty, MD Electrophysiologist: None  {Click to update primary MD,subspecialty MD or APP then REFRESH:1}   Referring Physician: Reather Littler, NP  {Removed [facility], PMH, PSH, ALLERGY, CMED, and SOC :1}   [name] is a 73 y.o. female with a history of HTN, HLD, CVA, PVD with right AKA, polycythemia vera, MI, CKD stage IIIb, CAD, and paroxysmal atrial fibrillation who presents for consultation in the [facility]. Seen by Cardiology on 01/12/24 and noted to be in NSR on flecainide with history of CAD in 2014 noted. Patient is on Eliquis 2.5 mg BID for a CHADS2VASC score of 6.  On evaluation today, she is currently in ***.   Today, she denies symptoms of ***palpitations, chest pain, shortness of breath, orthopnea, PND, lower extremity edema, dizziness, presyncope, syncope, snoring, daytime somnolence, bleeding, or neurologic sequela. The patient is tolerating medications without difficulties and is otherwise without complaint today.    Atrial Fibrillation Risk Factors:  she {Action; does/does not:19097} have symptoms or diagnosis of sleep apnea. she {ACTION; IS/IS ZOX:[id number]} compliant with CPAP therapy. she {Action; does/does not:19097} have a history of rheumatic fever. she {Action; does/does not:19097} have a history of alcohol use. The patient {Action; does/does not:19097} have a history of early familial atrial fibrillation or other arrhythmias.  she has a BMI of There is no height or weight on file to calculate BMI.. There were no vitals filed for this visit.  Current Outpatient Medications  Medication Sig Dispense Refill   acetaminophen (TYLENOL) 500 MG tablet Take 2 tablets (1,000 mg total) by mouth every 6 (six) hours. (Patient taking differently: Take 1,000 mg by mouth every 6 (six) hours as needed for mild pain (pain score 1-3).) 30 tablet 0   amLODipine (NORVASC)  10 MG tablet Take 1 tablet (10 mg total) by mouth daily. 30 tablet 2   apixaban (ELIQUIS) 2.5 MG TABS tablet Take 1 tablet (2.5 mg total) by mouth 2 (two) times daily. 180 tablet 3   aspirin EC 325 MG tablet Take 325 mg by mouth daily.     calcitRIOL (ROCALTROL) 0.25 MCG capsule Take 0.25 mcg by mouth 2 (two) times a week.     diclofenac Sodium (VOLTAREN) 1 % GEL Apply 2 g topically 4 (four) times daily as needed (pain).     escitalopram (LEXAPRO) 20 MG tablet TAKE ONE TABLET ONCE DAILY. NEEDS TO BE SEEN FOR REFILLS 30 tablet 0   ezetimibe (ZETIA) 10 MG tablet Take 1 tablet (10 mg total) by mouth daily. 90 tablet 3   FARXIGA 5 MG TABS tablet Take 5 mg by mouth daily.     fenofibrate (TRICOR) 145 MG tablet Take 1 tablet (145 mg total) by mouth daily. 90 tablet 3   flecainide (TAMBOCOR) 50 MG tablet TAKE ONE TABLET TWICE DAILY 60 tablet 0   furosemide (LASIX) 40 MG tablet Take 1 tablet (40 mg total) by mouth daily. 30 tablet 11   HYDROcodone-acetaminophen (NORCO/VICODIN) 5-325 MG tablet Take 1 tablet by mouth 2 (two) times daily as needed.     irbesartan (AVAPRO) 300 MG tablet Take 1 tablet (300 mg total) by mouth daily. 30 tablet 2   pantoprazole (PROTONIX) 40 MG tablet Take 1 tablet (40 mg total) by mouth daily. 30 tablet 1   No current facility-administered medications for this visit.    Atrial Fibrillation Management history:  Previous antiarrhythmic drugs: flecainide Previous cardioversions: none Previous ablations: none  Anticoagulation history: Eliquis 2.5 mg BID   ROS- All systems are reviewed and negative except as per the HPI above.  Physical Exam: There were no vitals taken for this visit.  GEN: Well nourished, well developed in no acute distress NECK: No JVD; No carotid bruits CARDIAC: {EPRHYTHM:28826}, no murmurs, rubs, gallops RESPIRATORY:  Clear to auscultation without rales, wheezing or rhonchi  ABDOMEN: Soft, non-tender, non-distended EXTREMITIES:  No edema; No  deformity   EKG today demonstrates ***  Echo 01/30/24 demonstrated  1. Left ventricular ejection fraction, by estimation, is 60 to 65%. The  left ventricle has normal function. Left ventricular endocardial border  not optimally defined to evaluate regional wall motion. There is severe  asymmetric left ventricular  hypertrophy of the septal and basal segments. Left ventricular diastolic  parameters are consistent with Grade I diastolic dysfunction (impaired  relaxation). Elevated left ventricular end-diastolic pressure.   2. Right ventricular systolic function is normal. The right ventricular  size is normal. Mildly increased right ventricular wall thickness.  Tricuspid regurgitation signal is inadequate for assessing PA pressure.   3. Left atrial size was mildly dilated.   4. The mitral valve is abnormal. Trivial mitral valve regurgitation.  Moderate mitral stenosis. The mean mitral valve gradient is 7.3 mmHg.  Severe mitral annular calcification.   5. The aortic valve was not well visualized. Aortic valve regurgitation  is not visualized. No aortic stenosis is present.   6. The inferior vena cava is normal in size with greater than 50%  respiratory variability, suggesting right atrial pressure of 3 mmHg.    ASSESSMENT & PLAN CHA2DS2-VASc Score =    The patient's score is based upon:   {Click here to calculate score.  REFRESH note before signing. :1}     ASSESSMENT AND PLAN: {Select the correct AFib Diagnosis                 :[id number]} PAF  Chads 6 with cva htn cad   Follow up ***   Lake Bells, PA-C  [facility] [address] [place], [place] [address] [phone]
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
## 2024-02-16 ENCOUNTER — Inpatient Hospital Stay: Payer: 59

## 2024-02-16 ENCOUNTER — Inpatient Hospital Stay: Payer: 59 | Admitting: Hematology

## 2024-02-16 ENCOUNTER — Telehealth: Payer: Self-pay

## 2024-02-16 DIAGNOSIS — D45 Polycythemia vera: Secondary | ICD-10-CM

## 2024-02-16 NOTE — Telephone Encounter (Signed)
 Spoke with pt's daughter via telephone regarding pt's appts today.  Pt's daughter stated that the pt had a stroke last week while in [facility] and now has been transferred to a rehab.  Instructed pt's daughter to please contact Dr. Latanya Maudlin office once the pt has been d/c from the rehab.  Pt's daughter verbalized understanding.  Dr. Mosetta Putt notified and appts cancelled for now.  Pt's daughter will contact Dr. Latanya Maudlin office once the pt is d/c from rehab.

## 2024-02-17 ENCOUNTER — Other Ambulatory Visit (HOSPITAL_COMMUNITY): Payer: 59

## 2024-02-17 ENCOUNTER — Ambulatory Visit: Payer: 59

## 2024-02-17 DIAGNOSIS — D631 Anemia in chronic kidney disease: Secondary | ICD-10-CM | POA: Diagnosis not present

## 2024-02-17 DIAGNOSIS — I482 Chronic atrial fibrillation, unspecified: Secondary | ICD-10-CM

## 2024-02-17 DIAGNOSIS — I739 Peripheral vascular disease, unspecified: Secondary | ICD-10-CM

## 2024-02-17 DIAGNOSIS — N1832 Chronic kidney disease, stage 3b: Secondary | ICD-10-CM | POA: Diagnosis not present

## 2024-02-17 DIAGNOSIS — I872 Venous insufficiency (chronic) (peripheral): Secondary | ICD-10-CM

## 2024-02-17 DIAGNOSIS — L97821 Non-pressure chronic ulcer of other part of left lower leg limited to breakdown of skin: Secondary | ICD-10-CM

## 2024-02-17 DIAGNOSIS — I130 Hypertensive heart and chronic kidney disease with heart failure and stage 1 through stage 4 chronic kidney disease, or unspecified chronic kidney disease: Secondary | ICD-10-CM

## 2024-02-17 DIAGNOSIS — I5033 Acute on chronic diastolic (congestive) heart failure: Secondary | ICD-10-CM

## 2024-02-17 DIAGNOSIS — G9341 Metabolic encephalopathy: Secondary | ICD-10-CM

## 2024-02-17 DIAGNOSIS — I2510 Atherosclerotic heart disease of native coronary artery without angina pectoris: Secondary | ICD-10-CM

## 2024-02-17 DIAGNOSIS — I252 Old myocardial infarction: Secondary | ICD-10-CM

## 2024-02-17 DIAGNOSIS — J9601 Acute respiratory failure with hypoxia: Secondary | ICD-10-CM

## 2024-02-18 ENCOUNTER — Ambulatory Visit: Payer: 59 | Attending: Physician Assistant | Admitting: Physician Assistant

## 2024-02-18 NOTE — Progress Notes (Signed)
 This encounter was created in error - please disregard.

## 2024-02-25 ENCOUNTER — Other Ambulatory Visit: Payer: Self-pay | Admitting: Family Medicine

## 2024-02-25 DIAGNOSIS — I482 Chronic atrial fibrillation, unspecified: Secondary | ICD-10-CM

## 2024-03-07 ENCOUNTER — Telehealth: Payer: Self-pay

## 2024-03-07 NOTE — Telephone Encounter (Signed)
 Patient has intolerance to statins. Tried multiple. She is currently taking zetia and is established with Cardiology.

## 2024-03-07 NOTE — Telephone Encounter (Signed)
 Copied from CRM [id number]. Topic: Clinical - Prescription Issue >> Mar 06, 2024  5:13 PM Priscille Loveless wrote: Reason for CRM:  [name] from [facility] pharmacy representative. [phone]  [employer] states that this pt is ASCVD and it is recommended that a patient with this needs to take a statin and it doesn't show that she is.   ACC guidelines patients that are 40 and younger to take this. Please advise.

## 2024-04-04 ENCOUNTER — Inpatient Hospital Stay: Admitting: Family Medicine

## 2024-04-04 ENCOUNTER — Encounter: Payer: Self-pay | Admitting: Family Medicine

## 2024-04-06 ENCOUNTER — Other Ambulatory Visit: Payer: Self-pay | Admitting: Family Medicine

## 2024-05-02 ENCOUNTER — Encounter: Payer: Self-pay | Admitting: Family Medicine

## 2024-05-03 ENCOUNTER — Ambulatory Visit: Admitting: Nurse Practitioner

## 2024-05-03 ENCOUNTER — Encounter: Payer: Self-pay | Admitting: Nurse Practitioner

## 2024-05-03 VITALS — BP 180/85 | HR 88 | Temp 97.8°F | Ht 65.0 in | Wt 100.0 lb

## 2024-05-03 DIAGNOSIS — R32 Unspecified urinary incontinence: Secondary | ICD-10-CM | POA: Diagnosis not present

## 2024-05-03 DIAGNOSIS — I482 Chronic atrial fibrillation, unspecified: Secondary | ICD-10-CM

## 2024-05-03 DIAGNOSIS — Z09 Encounter for follow-up examination after completed treatment for conditions other than malignant neoplasm: Secondary | ICD-10-CM

## 2024-05-03 DIAGNOSIS — I639 Cerebral infarction, unspecified: Secondary | ICD-10-CM

## 2024-05-03 DIAGNOSIS — I6930 Unspecified sequelae of cerebral infarction: Secondary | ICD-10-CM | POA: Diagnosis not present

## 2024-05-03 DIAGNOSIS — S78111A Complete traumatic amputation at level between right hip and knee, initial encounter: Secondary | ICD-10-CM

## 2024-05-03 DIAGNOSIS — S78111D Complete traumatic amputation at level between right hip and knee, subsequent encounter: Secondary | ICD-10-CM | POA: Diagnosis not present

## 2024-05-03 DIAGNOSIS — Z8673 Personal history of transient ischemic attack (TIA), and cerebral infarction without residual deficits: Secondary | ICD-10-CM

## 2024-05-03 DIAGNOSIS — E785 Hyperlipidemia, unspecified: Secondary | ICD-10-CM

## 2024-05-03 DIAGNOSIS — R159 Full incontinence of feces: Secondary | ICD-10-CM

## 2024-05-03 MED ORDER — PANTOPRAZOLE SODIUM 40 MG PO TBEC: 40.0000 mg | DELAYED_RELEASE_TABLET | Freq: Every day | ORAL | 0 refills | Status: DC

## 2024-05-03 MED ORDER — ESCITALOPRAM OXALATE 20 MG PO TABS
20.0000 mg | ORAL_TABLET | Freq: Every day | ORAL | 0 refills | Status: DC
Start: 2024-05-03 — End: 2024-07-24

## 2024-05-03 MED ORDER — AMLODIPINE BESYLATE 10 MG PO TABS
10.0000 mg | ORAL_TABLET | Freq: Every day | ORAL | 0 refills | Status: DC
Start: 2024-05-03 — End: 2024-07-24

## 2024-05-03 MED ORDER — FARXIGA 5 MG PO TABS: 5.0000 mg | ORAL_TABLET | Freq: Every day | ORAL | 0 refills | Status: DC

## 2024-05-03 MED ORDER — APIXABAN 2.5 MG PO TABS: 2.5000 mg | ORAL_TABLET | Freq: Two times a day (BID) | ORAL | 0 refills | Status: AC

## 2024-05-03 MED ORDER — FLECAINIDE ACETATE 50 MG PO TABS: 50.0000 mg | ORAL_TABLET | Freq: Two times a day (BID) | ORAL | 0 refills | Status: DC

## 2024-05-03 MED ORDER — COLCHICINE 0.6 MG PO CAPS: 0.6000 mg | ORAL_CAPSULE | Freq: Every day | ORAL | 0 refills | Status: DC

## 2024-05-03 MED ORDER — ALENDRONATE SODIUM 70 MG PO TABS: 70.0000 mg | ORAL_TABLET | ORAL | 0 refills | Status: DC

## 2024-05-03 MED ORDER — EZETIMIBE 10 MG PO TABS: 10.0000 mg | ORAL_TABLET | Freq: Every day | ORAL | 0 refills | Status: AC
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
## 2024-05-04 DIAGNOSIS — I6930 Unspecified sequelae of cerebral infarction: Secondary | ICD-10-CM | POA: Insufficient documentation

## 2024-05-04 DIAGNOSIS — Z8673 Personal history of transient ischemic attack (TIA), and cerebral infarction without residual deficits: Secondary | ICD-10-CM | POA: Insufficient documentation

## 2024-05-08 ENCOUNTER — Other Ambulatory Visit: Payer: Self-pay | Admitting: Nurse Practitioner

## 2024-05-08 ENCOUNTER — Encounter: Payer: Self-pay | Admitting: Cardiology

## 2024-05-09 ENCOUNTER — Encounter: Payer: Self-pay | Admitting: Family Medicine

## 2024-05-12 ENCOUNTER — Ambulatory Visit (INDEPENDENT_AMBULATORY_CARE_PROVIDER_SITE_OTHER): Admitting: Family Medicine

## 2024-05-12 ENCOUNTER — Encounter: Payer: Self-pay | Admitting: Family Medicine

## 2024-05-12 VITALS — BP 142/77 | HR 85 | Temp 97.8°F | Wt 100.0 lb

## 2024-05-12 DIAGNOSIS — I96 Gangrene, not elsewhere classified: Secondary | ICD-10-CM

## 2024-05-12 DIAGNOSIS — D509 Iron deficiency anemia, unspecified: Secondary | ICD-10-CM | POA: Diagnosis not present

## 2024-05-12 DIAGNOSIS — I771 Stricture of artery: Secondary | ICD-10-CM

## 2024-05-12 DIAGNOSIS — R32 Unspecified urinary incontinence: Secondary | ICD-10-CM

## 2024-05-12 DIAGNOSIS — D45 Polycythemia vera: Secondary | ICD-10-CM

## 2024-05-12 DIAGNOSIS — Z89611 Acquired absence of right leg above knee: Secondary | ICD-10-CM

## 2024-05-12 DIAGNOSIS — R159 Full incontinence of feces: Secondary | ICD-10-CM

## 2024-05-12 DIAGNOSIS — Z8673 Personal history of transient ischemic attack (TIA), and cerebral infarction without residual deficits: Secondary | ICD-10-CM

## 2024-05-12 DIAGNOSIS — I10 Essential (primary) hypertension: Secondary | ICD-10-CM | POA: Diagnosis not present

## 2024-05-12 MED ORDER — LISINOPRIL 10 MG PO TABS: 10.0000 mg | ORAL_TABLET | Freq: Every day | ORAL | 0 refills | Status: DC

## 2024-05-12 NOTE — Patient Instructions (Addendum)
  [facility] ([place])  [address]  [place], [place] [address]  [phone]    Monitoring your BP at home   Your BP was elevated today in office  Please keep a log of your BP at home.  The best time to take BP is 1st thing in the morning after waking.  Sit for 5 minutes with feet flat on the floor, arm at heart level.  Options for BP cuffs are at [employer], [employer], [employer], [facility] & [facility].  We will review measurements at follow up and determine plan for BP management.  If you have access, you can send a message in MyChart with your measurements prior to your follow up appointment.  The brand I recommend to get is Omron (Bronze).

## 2024-05-12 NOTE — Progress Notes (Unsigned)
 Subjective:  Patient ID: [name], female    DOB: 01/16/1951, 73 y.o.   MRN: [id number]  Patient Care Team: Chrystine Crate, FNP as PCP - General (Family Medicine) Avanell Leigh, MD as PCP - Cardiology (Cardiology) Scotty Cyphers, MD as Consulting Physician (Oncology) Richrd Char, MD (Inactive) as Consulting Physician (Vascular Surgery) Sonja East Side, MD as Attending Physician (Hematology and Oncology) Jane Meager, MD as Consulting Physician (Nephrology)   Chief Complaint:  pinky finger necrosis  HPI: [name] is a 73 y.o. female presenting on 05/12/2024 for pinky finger necrosis  HPI Pinky finger necrosis  States that they noticed it while in she was in rehab, 3 of her fingers "turned black". States that she has artery blockage and had improvement except for in the pinky finger and it is not getting better. States that it is not getting better. Reports that it is painful, getting hit on things. Reports that twice it has drainage. Came home May 4th from rehab.   She continues to have PT, OT, home aide  Would like diapers and pads.  Todd early    Relevant past medical, surgical, family, and social history reviewed and updated as indicated.  Allergies and medications reviewed and updated. Data reviewed: Chart in Epic.   Past Medical History:  Diagnosis Date  . Acute blood loss anemia 05/04/2020  . Arthritis    "fingers, some in my knees" (01/28/2016)  . CAD (coronary artery disease), per cath 04/27/13, non obstructive disease, EF 65-70% 05/11/2013  . Carotid disease, bilateral (HCC) 09/20/2013   Lt carotid 50-69% stentosis, less on rt  . Cellulitis of left leg 01/16/2022  . Dysrhythmia   . Elevated troponin 05/04/2020  . Encounter for support and coordination of transition of care 11/11/2021  . Fatty liver   . GERD (gastroesophageal reflux disease)   . Glaucoma   . H/O cardiovascular stress test 12/27/2009   negative for ischemia  .  Headache    "occasional since eye pressure regulated" (01/28/2016)  . Hospital discharge follow-up 10/15/2021  . Hyperlipidemia   . Hypertension   . Hypertensive emergency 11/28/2022  . Leukocytosis 07/15/2015  . Migraine    "stopped w/laser holes to relieve pressure in my eyes; had them 3-4 times/wk before taht" (01/28/2016)  . Mitral regurgitation,2+ by cath 05/11/2013  . NSTEMI (non-ST elevated myocardial infarction) (HCC) 04/27/2013  . Pain    BOTH KNEES - PT HAS TORN MENISCUS LEFT KNEE  . Pain in both wrists 09/03/2021  . Pain in left knee 10/03/2018  . Paroxysmal atrial fibrillation (HCC)    hx of a fib on ASA  AND ELIQUIS    . Polycythemia vera (HCC) 08/20/2015   JAK-2 positive 07/19/15  . Prosthesis adjustment 01/13/2019  . PVD (peripheral vascular disease) with claudication (HCC) 07/21/2006   previous L ext iliac stent and rt SFA stent, known occluded Lt SFA  . S/P cardiac cath 04/27/2013   NON OBSTRUCTIVE DISEASE, 2+ mr, ef 65-70%  . Statin intolerance   . Stroke (HCC) 12/21/2004   SWELLING ALL OVER AND RT SIDE OF FACE DRAWN AND SPEECH SLURRED AND NUMBNESS ON RIGHT SIDE-- ALL RESOLVED  . TIA (transient ischemic attack)     Past Surgical History:  Procedure Laterality Date  . AMPUTATION Right 05/21/2017   Procedure: AMPUTATION ABOVE KNEE;  Surgeon: Mayo Speck, MD;  Location: [facility] OR;  Service: Vascular;  Laterality: Right;  . CARDIAC CATHETERIZATION  04/27/13   non  occlusive disease with mild to mod. calcified lesions in ostial LM and proximal LAD and moderate prox. RC AND DISTAL AV GROOVE LCX, ef  . CATARACT EXTRACTION W/PHACO Right 03/21/2015   Procedure: CATARACT EXTRACTION PHACO AND INTRAOCULAR LENS PLACEMENT RIGHT EYE;  Surgeon: Tarri Farm, MD;  Location: [facility] ORS;  Service: Ophthalmology;  Laterality: Right;  CDE:5.10  . CATARACT EXTRACTION W/PHACO Left 05/16/2015   Procedure: CATARACT EXTRACTION PHACO AND INTRAOCULAR LENS PLACEMENT (IOC);  Surgeon: Tarri Farm, MD;   Location: [facility] ORS;  Service: Ophthalmology;  Laterality: Left;  CDE 5.57  . CESAREAN SECTION  1977  . COLONOSCOPY N/A 09/11/2016   Procedure: COLONOSCOPY;  Surgeon: Ruby Corporal, MD;  Location: [facility] ENDO SUITE;  Service: Endoscopy;  Laterality: N/A;  730-moved to 1:00 Ann notified pt  . EMBOLECTOMY Right 02/01/2016   Procedure: Thrombectomy of Right Femoral-Popliteal Bypass Graft; Endarterectomy of Right Below Knee Popliteal Artery and Tibial Peroneal Trunk with Bovine Pericardium Patch Angioplasty.;  Surgeon: Dannis Dy, MD;  Location: [facility] OR;  Service: Vascular;  Laterality: Right;  . FEMORAL-POPLITEAL BYPASS GRAFT Right 01/31/2016   Procedure: BYPASS GRAFT RIGHT COMMON  FEMORALTO BELOW KNEE POPLITEAL ARTERY BYPASS GRAFT USING PROPATEN GORTEX GRAFT;  Surgeon: Palma Bob, MD;  Location: [facility] OR;  Service: Vascular;  Laterality: Right;  . FEMORAL-POPLITEAL BYPASS GRAFT Right 01/09/2017   Procedure: THROMBECTOMY OF RIGHT FEMORAL-POPLITEAL  ARTERY BYPASS GRAFT; THROMBECTOMY RIGHT  TIBIAL VESSELS;  Surgeon: Richrd Char, MD;  Location: [facility] OR;  Service: Vascular;  Laterality: Right;  . FEMORAL-POPLITEAL BYPASS GRAFT Right 05/17/2017   Procedure: RIGHT FEMORAL-TIBIAL PERONEAL TRUNK ARTERY BYPASS GRAFT USING 71mmX80cm PROPATEN GRAFT WITH REMOVABLE RING;  Surgeon: Mayo Speck, MD;  Location: [facility] OR;  Service: Vascular;  Laterality: Right;  . FRACTURE SURGERY    . ILIAC ARTERY STENT  07/2006   external iliac and Rt SFA stent 07/2006 AND THE LEFT WAS IN 2006  . KNEE ARTHROSCOPY WITH MEDIAL MENISECTOMY Left 03/27/2014   Procedure: left knee arthorscopy with medial chondraplasty of the medial femoral and patella, medial microfracture technique of medial femoral condyl;  Surgeon: Florencia Hunter, MD;  Location: [facility] ORS;  Service: Orthopedics;  Laterality: Left;  . LEFT HEART CATHETERIZATION WITH CORONARY ANGIOGRAM Right 04/27/2013   Procedure: LEFT HEART CATHETERIZATION WITH CORONARY ANGIOGRAM;   Surgeon: Arleen Lacer, MD;  Location: [facility] CATH LAB;  Service: Cardiovascular;  Laterality: Right;  . LOWER EXTREMITY ANGIOGRAM Right 01/31/2016   Procedure: INTRAOP RIGHT LOWER EXTREMITY ANGIOGRAM;  Surgeon: Palma Bob, MD;  Location: [facility] OR;  Service: Vascular;  Laterality: Right;  . ORIF WRIST FRACTURE Right 1983  . OVARIAN CYST REMOVAL Right   . PATCH ANGIOPLASTY Right 01/31/2016   Procedure: RIGHT COMMON FEMORAL AND PROFUNDA FEMORIS ENDARECTOMY WITH PATCH ANGIOPLASTY;  Surgeon: Palma Bob, MD;  Location: [facility] OR;  Service: Vascular;  Laterality: Right;  . PATCH ANGIOPLASTY Right 01/09/2017   Procedure: PATCH ANGIOPLASTY RIGHT POPLITEAL ARTERY BYPASS GRAFT;  Surgeon: Richrd Char, MD;  Location: [name] OR;  Service: Vascular;  Laterality: Right;  . PERIPHERAL VASCULAR CATHETERIZATION N/A 06/27/2015   Procedure: Lower Extremity Angiography;  Surgeon: Avanell Leigh, MD;  Location: [facility] INVASIVE CV LAB;  Service: Cardiovascular;  Laterality: N/A;  . PERIPHERAL VASCULAR CATHETERIZATION Bilateral 01/29/2016   Procedure: Lower Extremity Angiography;  Surgeon: Wenona Hamilton, MD;  Location: [facility] INVASIVE CV LAB;  Service: Cardiovascular;  Laterality: Bilateral;  . PERIPHERAL VASCULAR CATHETERIZATION N/A 01/29/2016   Procedure:  Abdominal Aortogram;  Surgeon: Wenona Hamilton, MD;  Location: [facility] INVASIVE CV LAB;  Service: Cardiovascular;  Laterality: N/A;  . REFRACTIVE SURGERY Bilateral    "6 in one eye; 7 in the other; to relieve pressure; not glaucoma" (01/28/2016)  . SFA Right 06/27/2015   overlapping Bahn covered stents  . TUBAL LIGATION  1983  . VEIN HARVEST Left 05/17/2017   Procedure: LEFT LEG GREATER SAPHENOUS VEIN HARVEST;  Surgeon: Mayo Speck, MD;  Location: [facility] OR;  Service: Vascular;  Laterality: Left;  Aaron Aas VEIN REPAIR Right 01/31/2016   Procedure: RIGHT GREATER SAPHENOUS VEIN EXAMNED BUT NOT REMOVED;  Surgeon: Palma Bob, MD;  Location: [facility] OR;  Service: Vascular;  Laterality: Right;     Social History   Socioeconomic History  . Marital status: Widowed    Spouse name: Not on file  . Number of children: 2  . Years of education: 51  . Highest education level: GED or equivalent  Occupational History  . Occupation: CNA  Tobacco Use  . Smoking status: Former    Current packs/day: 0.00    Average packs/day: 1 pack/day for 45.0 years (45.0 ttl pk-yrs)    Types: Cigarettes    Start date: 11/12/1967    Quit date: 11/11/2012    Years since quitting: 11.5  . Smokeless tobacco: Never  . Tobacco comments:    quit 4 years ago.    10/27/21-Pt declines lung ca screen at this time.   Vaping Use  . Vaping status: Never Used  Substance and Sexual Activity  . Alcohol  use: No    Alcohol /week: 0.0 standard drinks of alcohol   . Drug use: No  . Sexual activity: Not Currently    Birth control/protection: None  Other Topics Concern  . Not on file  Social History Narrative   Husband passed away after CVA in Jun 16, 2005.   2 children, 5 grandchildren and 1 great grandchild.   Social Drivers of Corporate investment banker Strain: Low Risk  (02/10/2024)   Received from [facility]   Overall Financial Resource Strain (CARDIA)   . Difficulty of Paying Living Expenses: Not hard at all  Food Insecurity: No Food Insecurity (02/25/2024)   Received from [facility]   Hunger Vital Sign   . Worried About Programme researcher, broadcasting/film/video in the Last Year: Never true   . Ran Out of Food in the Last Year: Never true  Transportation Needs: No Transportation Needs (02/28/2024)   Received from [facility] - Transportation   . Lack of Transportation (Medical): No   . Lack of Transportation (Non-Medical): No  Physical Activity: Inactive (02/10/2024)   Received from [facility]   Exercise Vital Sign   . Days of Exercise per Week: 0 days   . Minutes of Exercise per Session: 0 min  Stress: No Stress Concern Present (02/25/2024)   Received from [facility] - Occupational Stress Questionnaire   . Feeling of Stress : Not at all  Social Connections: Moderately Integrated (02/10/2024)   Received from [facility]   Social Connection and Isolation Panel [NHANES]   . Frequency of Communication with Friends and Family: More than three times a week   . Frequency of Social Gatherings with Friends and Family: More than three times a week   . Attends Religious Services: More than 4 times per year   . Active Member of Clubs or Organizations: No   .  Attends Banker Meetings: 1 to 4 times per year   . Marital Status: Widowed  Recent Concern: Social Connections - Moderately Isolated (12/01/2023)   Social Connection and Isolation Panel [NHANES]   . Frequency of Communication with Friends and Family: More than three times a week   . Frequency of Social Gatherings with Friends and Family: Three times a week   . Attends Religious Services: 1 to 4 times per year   . Active Member of Clubs or Organizations: No   . Attends Banker Meetings: Never   . Marital Status: Widowed  Intimate Partner Violence: Not At Risk (02/25/2024)   Received [facility]   HITS   . Over the last 12 months how often did your partner physically hurt you?: Never   . Over the last 12 months how often did your partner insult you or talk down to you?: Never   . Over the last 12 months how often did your partner threaten you with physical harm?: Never   . Over the last 12 months how often did your partner scream or curse at you?: Never    Outpatient Encounter Medications as of 05/12/2024  Medication Sig  . alendronate  (FOSAMAX ) 70 MG tablet Take 1 tablet (70 mg total) by mouth once a week.  . amLODipine  (NORVASC ) 10 MG tablet Take 1 tablet (10 mg total) by mouth daily.  . apixaban  (ELIQUIS ) 2.5 MG TABS tablet Take 1 tablet (2.5 mg total) by mouth 2 (two) times daily.  . aspirin  EC 325 MG tablet Take 325 mg by mouth daily.  . Calcium   Carbonate (CALTRATE 600 PO) Take by mouth.  . Colchicine  0.6 MG CAPS Take 1 capsule (0.6 mg total) by mouth daily.  . diclofenac  Sodium (VOLTAREN ) 1 % GEL Apply 2 g topically 4 (four) times daily as needed (pain).  . escitalopram  (LEXAPRO ) 20 MG tablet Take 1 tablet (20 mg total) by mouth daily.  . ezetimibe  (ZETIA ) 10 MG tablet Take 1 tablet (10 mg total) by mouth daily.  . FARXIGA  5 MG TABS tablet Take 1 tablet (5 mg total) by mouth daily.  . fenofibrate  (TRICOR ) 145 MG tablet Take 1 tablet (145 mg total) by mouth daily. (Patient taking differently: Take 134 mg by mouth daily.)  . flecainide  (TAMBOCOR ) 50 MG tablet Take 1 tablet (50 mg total) by mouth 2 (two) times daily.  . HYDROcodone -acetaminophen  (NORCO/VICODIN) 5-325 MG tablet Take 1 tablet by mouth 2 (two) times daily as needed.  Aaron Aas ipratropium-albuterol  (DUONEB) 0.5-2.5 (3) MG/3ML SOLN Take 3 mLs by nebulization.  . methocarbamol (ROBAXIN) 500 MG tablet Take by mouth.  . pantoprazole  (PROTONIX ) 40 MG tablet Take 1 tablet (40 mg total) by mouth daily.  . zinc  oxide 20 % ointment Apply 1 Application topically as needed for irritation.   No facility-administered encounter medications on file as of 05/12/2024.    Allergies  Allergen Reactions  . Atenolol Rash and Itching  . Demerol   [Meperidine  Hcl] Itching  . Demerol  [Meperidine ] Itching and Other (See Comments)    Unknown, can't remember   . Gabapentin  Anxiety and Other (See Comments)    SI and HI  . Inderal [Propranolol] Other (See Comments) and Itching    Doesn't remember   . Prednisone Other (See Comments)    Muscle spasms  . Statins Itching and Other (See Comments)    Simvastatin  - caused severe itching Pravastatin  - caused lesser tiching One type of statin? Caused stroke in 2006, and  other symptoms as result  . Warfarin And Related Other (See Comments)    Caused nose bleeds  . Crestor [Rosuvastatin] Itching and Rash  . Docosahexaenoic Acid (Dha) (Fish)     nosebleeds   . Lactose Intolerance (Gi) Other (See Comments)    Bloating, gas  . Lipitor [Atorvastatin ] Rash  . Lovaza [Omega-3-Acid Ethyl Esters (Fish)] Other (See Comments)    nosebleeds   . Penicillins Hives, Rash and Other (See Comments)    Immediate rash, facial/tongue/throat swelling, SOB or lightheadedness with hypotension Questionable high fever   . Pravastatin  Itching and Rash  . Simvastatin  Itching, Rash and Other (See Comments)  . Trilipix [Choline Fenofibrate ] Other (See Comments)    Doesn't remember   . Warfarin Other (See Comments)    nosebleeds  . Hydralazine  Swelling  . Effexor [Venlafaxine Hcl] Other (See Comments)    Doesn't remember   . Nexium [Esomeprazole Magnesium ] Rash  . Percocet [Oxycodone -Acetaminophen ] Other (See Comments)    Doesn't remember   . Plavix  [Clopidogrel  Bisulfate] Rash    Review of Systems  Objective:  BP (!) 142/77 (BP Location: Left Leg, Patient Position: Sitting, Cuff Size: Small)   Pulse 85   Temp 97.8 F (36.6 C)   Wt 100 lb (45.4 kg) Comment: unable to stand, previous  SpO2 97%   BMI 16.64 kg/m    Wt Readings from Last 3 Encounters:  05/12/24 100 lb (45.4 kg)  05/03/24 100 lb (45.4 kg)  02/02/24 113 lb 12.1 oz (51.6 kg)   Physical Exam  Results for orders placed or performed during the hospital encounter of 01/28/24  Resp panel by RT-PCR (RSV, Flu A&B, Covid) Anterior Nasal Swab   Collection Time: 01/28/24  6:17 PM   Specimen: Anterior Nasal Swab  Result Value Ref Range   SARS Coronavirus 2 by RT PCR NEGATIVE NEGATIVE   Influenza A by PCR NEGATIVE NEGATIVE   Influenza B by PCR NEGATIVE NEGATIVE   Resp Syncytial Virus by PCR NEGATIVE NEGATIVE  CBC with Differential   Collection Time: 01/28/24  6:27 PM  Result Value Ref Range   WBC 34.8 (H) 4.0 - 10.5 K/uL   RBC 6.44 (H) 3.87 - 5.11 MIL/uL   Hemoglobin 12.9 12.0 - 15.0 g/dL   HCT 78.2 (H) 95.6 - 21.3 %   MCV 72.7 (L) 80.0 - 100.0 fL   MCH 20.0 (L) 26.0 - 34.0 pg   MCHC 27.6  (L) 30.0 - 36.0 g/dL   RDW 08.6 (H) 57.8 - 46.9 %   Platelets 356 150 - 400 K/uL   nRBC 0.4 (H) 0.0 - 0.2 %   Neutrophils Relative % 87 %   Neutro Abs 30.2 (H) 1.7 - 7.7 K/uL   Lymphocytes Relative 3 %   Lymphs Abs 1.1 0.7 - 4.0 K/uL   Monocytes Relative 2 %   Monocytes Absolute 0.8 0.1 - 1.0 K/uL   Eosinophils Relative 1 %   Eosinophils Absolute 0.4 0.0 - 0.5 K/uL   Basophils Relative 2 %   Basophils Absolute 0.5 (H) 0.0 - 0.1 K/uL   WBC Morphology Abnormal lymphocytes present    Immature Granulocytes 5 %   Abs Immature Granulocytes 1.79 (H) 0.00 - 0.07 K/uL  Comprehensive metabolic panel   Collection Time: 01/28/24  6:27 PM  Result Value Ref Range   Sodium 136 135 - 145 mmol/L   Potassium 4.5 3.5 - 5.1 mmol/L   Chloride 109 98 - 111 mmol/L   CO2 16 (L) 22 - 32  mmol/L   Glucose, Bld 101 (H) 70 - 99 mg/dL   BUN 30 (H) 8 - 23 mg/dL   Creatinine, Ser 1.61 (H) 0.44 - 1.00 mg/dL   Calcium  8.4 (L) 8.9 - 10.3 mg/dL   Total Protein 6.1 (L) 6.5 - 8.1 g/dL   Albumin  2.9 (L) 3.5 - 5.0 g/dL   AST 20 15 - 41 U/L   ALT 12 0 - 44 U/L   Alkaline Phosphatase 90 38 - 126 U/L   Total Bilirubin 0.8 0.0 - 1.2 mg/dL   GFR, Estimated 36 (L) >60 mL/min   Anion gap 11 5 - 15  Lactic acid, plasma   Collection Time: 01/28/24  6:27 PM  Result Value Ref Range   Lactic Acid, Venous 0.7 0.5 - 1.9 mmol/L  Troponin I (High Sensitivity)   Collection Time: 01/28/24  6:27 PM  Result Value Ref Range   Troponin I (High Sensitivity) 35 (H) <18 ng/L  CBG monitoring, ED   Collection Time: 01/28/24  6:52 PM  Result Value Ref Range   Glucose-Capillary 80 70 - 99 mg/dL  CBG monitoring, ED   Collection Time: 01/28/24  7:22 PM  Result Value Ref Range   Glucose-Capillary 70 70 - 99 mg/dL  Blood culture (routine x 2)   Collection Time: 01/28/24  7:52 PM   Specimen: Left Antecubital; Blood  Result Value Ref Range   Specimen Description LEFT ANTECUBITAL    Special Requests NONE    Culture      NO GROWTH 5  DAYS Performed at [facility], [address] [place], [place] [address]    Report Status 02/02/2024 FINAL   Blood culture (routine x 2)   Collection Time: 01/28/24  7:58 PM   Specimen: BLOOD LEFT FOREARM  Result Value Ref Range   Specimen Description BLOOD LEFT FOREARM    Special Requests NONE    Culture      NO GROWTH 5 DAYS Performed at [facility], [address] [place], [place] [address]    Report Status 02/02/2024 FINAL   Troponin I (High Sensitivity)   Collection Time: 01/28/24  8:08 PM  Result Value Ref Range   Troponin I (High Sensitivity) 45 (H) <18 ng/L  Urinalysis, Routine w reflex microscopic -Urine, Clean Catch   Collection Time: 01/28/24  9:31 PM  Result Value Ref Range   Color, Urine YELLOW YELLOW   APPearance CLEAR CLEAR   Specific Gravity, Urine 1.020 1.005 - 1.030   pH 5.0 5.0 - 8.0   Glucose, UA 150 (A) NEGATIVE mg/dL   Hgb urine dipstick SMALL (A) NEGATIVE   Bilirubin Urine NEGATIVE NEGATIVE   Ketones, ur NEGATIVE NEGATIVE mg/dL   Protein, ur >=119 (A) NEGATIVE mg/dL   Nitrite NEGATIVE NEGATIVE   Leukocytes,Ua NEGATIVE NEGATIVE   RBC / HPF 0-5 0 - 5 RBC/hpf   WBC, UA 0-5 0 - 5 WBC/hpf   Bacteria, UA NONE SEEN NONE SEEN   Squamous Epithelial / HPF 0-5 0 - 5 /HPF   Mucus PRESENT   Ferritin   Collection Time: 01/29/24  3:26 AM  Result Value Ref Range   Ferritin 17 11 - 307 ng/mL  Iron and TIBC   Collection Time: 01/29/24  3:26 AM  Result Value Ref Range   Iron 15 (L) 28 - 170 ug/dL   TIBC 147 829 - 562 ug/dL   Saturation Ratios 5 (L) 10.4 - 31.8 %   UIBC 317 ug/dL  Comprehensive metabolic panel   Collection Time:  01/29/24  3:26 AM  Result Value Ref Range   Sodium 136 135 - 145 mmol/L   Potassium 4.1 3.5 - 5.1 mmol/L   Chloride 109 98 - 111 mmol/L   CO2 16 (L) 22 - 32 mmol/L   Glucose, Bld 82 70 - 99 mg/dL   BUN 31 (H) 8 - 23 mg/dL   Creatinine, Ser 1.19 (H) 0.44 - 1.00 mg/dL   Calcium  8.2 (L) 8.9 - 10.3 mg/dL   Total Protein 5.6 (L) 6.5  - 8.1 g/dL   Albumin  2.7 (L) 3.5 - 5.0 g/dL   AST 18 15 - 41 U/L   ALT 11 0 - 44 U/L   Alkaline Phosphatase 84 38 - 126 U/L   Total Bilirubin 0.7 0.0 - 1.2 mg/dL   GFR, Estimated 42 (L) >60 mL/min   Anion gap 11 5 - 15  CBC   Collection Time: 01/29/24  3:26 AM  Result Value Ref Range   WBC 28.6 (H) 4.0 - 10.5 K/uL   RBC 5.82 (H) 3.87 - 5.11 MIL/uL   Hemoglobin 11.7 (L) 12.0 - 15.0 g/dL   HCT 14.7 82.9 - 56.2 %   MCV 72.7 (L) 80.0 - 100.0 fL   MCH 20.1 (L) 26.0 - 34.0 pg   MCHC 27.7 (L) 30.0 - 36.0 g/dL   RDW 13.0 (H) 86.5 - 78.4 %   Platelets 333 150 - 400 K/uL   nRBC 0.5 (H) 0.0 - 0.2 %  Magnesium    Collection Time: 01/29/24  3:26 AM  Result Value Ref Range   Magnesium  1.6 (L) 1.7 - 2.4 mg/dL  Phosphorus   Collection Time: 01/29/24  3:26 AM  Result Value Ref Range   Phosphorus 4.5 2.5 - 4.6 mg/dL  Rapid urine drug screen (hospital performed)   Collection Time: 01/29/24  8:10 AM  Result Value Ref Range   Opiates POSITIVE (A) NONE DETECTED   Cocaine NONE DETECTED NONE DETECTED   Benzodiazepines NONE DETECTED NONE DETECTED   Amphetamines NONE DETECTED NONE DETECTED   Tetrahydrocannabinol NONE DETECTED NONE DETECTED   Barbiturates NONE DETECTED NONE DETECTED  Cortisol-am, blood   Collection Time: 01/29/24  8:25 AM  Result Value Ref Range   Cortisol - AM 17.0 6.7 - 22.6 ug/dL  MRSA Next Gen by PCR, Nasal   Collection Time: 01/29/24 12:08 PM   Specimen: Nasal Mucosa; Nasal Swab  Result Value Ref Range   MRSA by PCR Next Gen NOT DETECTED NOT DETECTED  CBC   Collection Time: 01/30/24  4:43 AM  Result Value Ref Range   WBC 28.7 (H) 4.0 - 10.5 K/uL   RBC 5.68 (H) 3.87 - 5.11 MIL/uL   Hemoglobin 11.3 (L) 12.0 - 15.0 g/dL   HCT 69.6 29.5 - 28.4 %   MCV 71.3 (L) 80.0 - 100.0 fL   MCH 19.9 (L) 26.0 - 34.0 pg   MCHC 27.9 (L) 30.0 - 36.0 g/dL   RDW 13.2 (H) 44.0 - 10.2 %   Platelets 329 150 - 400 K/uL   nRBC 0.4 (H) 0.0 - 0.2 %  Basic metabolic panel   Collection Time:  01/30/24  4:43 AM  Result Value Ref Range   Sodium 135 135 - 145 mmol/L   Potassium 3.8 3.5 - 5.1 mmol/L   Chloride 109 98 - 111 mmol/L   CO2 16 (L) 22 - 32 mmol/L   Glucose, Bld 108 (H) 70 - 99 mg/dL   BUN 26 (H) 8 - 23 mg/dL   Creatinine,  Ser 1.15 (H) 0.44 - 1.00 mg/dL   Calcium  7.9 (L) 8.9 - 10.3 mg/dL   GFR, Estimated 51 (L) >60 mL/min   Anion gap 10 5 - 15  ECHOCARDIOGRAM COMPLETE   Collection Time: 01/30/24  8:05 AM  Result Value Ref Range   Weight 1,696.66 oz   Height 65 in   BP 86/58 mmHg   AR max vel 2.60 cm2   AV Area VTI 2.26 cm2   AV Mean grad 5.0 mmHg   AV Peak grad 7.6 mmHg   Ao pk vel 1.38 m/s   AV Area mean vel 2.06 cm2   MV VTI 1.50 cm2   Area-P 1/2 2.62 cm2   S' Lateral 2.50 cm   Est EF 60 - 65%   CBC   Collection Time: 01/31/24  3:53 AM  Result Value Ref Range   WBC 28.4 (H) 4.0 - 10.5 K/uL   RBC 5.77 (H) 3.87 - 5.11 MIL/uL   Hemoglobin 11.3 (L) 12.0 - 15.0 g/dL   HCT 95.6 38.7 - 56.4 %   MCV 71.2 (L) 80.0 - 100.0 fL   MCH 19.6 (L) 26.0 - 34.0 pg   MCHC 27.5 (L) 30.0 - 36.0 g/dL   RDW 33.2 (H) 95.1 - 88.4 %   Platelets 322 150 - 400 K/uL   nRBC 0.3 (H) 0.0 - 0.2 %  Basic metabolic panel   Collection Time: 01/31/24  3:53 AM  Result Value Ref Range   Sodium 137 135 - 145 mmol/L   Potassium 3.9 3.5 - 5.1 mmol/L   Chloride 107 98 - 111 mmol/L   CO2 22 22 - 32 mmol/L   Glucose, Bld 100 (H) 70 - 99 mg/dL   BUN 22 8 - 23 mg/dL   Creatinine, Ser 1.66 (H) 0.44 - 1.00 mg/dL   Calcium  7.6 (L) 8.9 - 10.3 mg/dL   GFR, Estimated 51 (L) >60 mL/min   Anion gap 8 5 - 15  Lactic acid, plasma   Collection Time: 01/31/24  8:44 AM  Result Value Ref Range   Lactic Acid, Venous 0.7 0.5 - 1.9 mmol/L  Renal function panel   Collection Time: 02/01/24  4:33 AM  Result Value Ref Range   Sodium 137 135 - 145 mmol/L   Potassium 3.7 3.5 - 5.1 mmol/L   Chloride 104 98 - 111 mmol/L   CO2 24 22 - 32 mmol/L   Glucose, Bld 82 70 - 99 mg/dL   BUN 27 (H) 8 - 23 mg/dL    Creatinine, Ser 0.63 (H) 0.44 - 1.00 mg/dL   Calcium  7.5 (L) 8.9 - 10.3 mg/dL   Phosphorus 3.9 2.5 - 4.6 mg/dL   Albumin  2.3 (L) 3.5 - 5.0 g/dL   GFR, Estimated 46 (L) >60 mL/min   Anion gap 9 5 - 15  Renal function panel   Collection Time: 02/02/24  4:27 AM  Result Value Ref Range   Sodium 137 135 - 145 mmol/L   Potassium 3.8 3.5 - 5.1 mmol/L   Chloride 104 98 - 111 mmol/L   CO2 23 22 - 32 mmol/L   Glucose, Bld 81 70 - 99 mg/dL   BUN 26 (H) 8 - 23 mg/dL   Creatinine, Ser 0.16 (H) 0.44 - 1.00 mg/dL   Calcium  7.9 (L) 8.9 - 10.3 mg/dL   Phosphorus 3.3 2.5 - 4.6 mg/dL   Albumin  2.5 (L) 3.5 - 5.0 g/dL   GFR, Estimated 46 (L) >60 mL/min   Anion gap 10  5 - 15   *Note: Due to a large number of results and/or encounters for the requested time period, some results have not been displayed. A complete set of results can be found in Results Review.       12/01/2023   10:52 AM 09/14/2023   10:47 AM 06/23/2023   11:17 AM 02/16/2023   10:14 AM 02/09/2023    2:09 PM  Depression screen PHQ 2/9  Decreased Interest 0 0 0 0 0  Down, Depressed, Hopeless 0 0 0 0 0  PHQ - 2 Score 0 0 0 0 0  Altered sleeping  0 0 0   Tired, decreased energy  0 0 0   Change in appetite  0 0 0   Feeling bad or failure about yourself   0 0 0   Trouble concentrating  0 0 0   Moving slowly or fidgety/restless  0 0 0   Suicidal thoughts  0 0 0   PHQ-9 Score  0 0 0   Difficult doing work/chores  Not difficult at all Not difficult at all Not difficult at all        09/14/2023   10:48 AM 06/23/2023   11:17 AM 02/16/2023   10:14 AM 12/25/2022   12:02 PM  GAD 7 : Generalized Anxiety Score  Nervous, Anxious, on Edge 0 0 0 0  Control/stop worrying 0 0 0 0  Worry too much - different things 0 0 0 0  Trouble relaxing 0 0 0 0  Restless 0 0 0 0  Easily annoyed or irritable 0 0 0 0  Afraid - awful might happen 0 0 0 0  Total GAD 7 Score 0 0 0 0  Anxiety Difficulty Somewhat difficult Not difficult at all Not difficult at all  Not difficult at all   Pertinent labs & imaging results that were available during my care of the patient were reviewed by me and considered in my medical decision making.  Assessment & Plan:  Isobelle was seen today for pinky finger necrosis.  Diagnoses and all orders for this visit:  Subclavian artery stenosis (HCC) -     Ambulatory referral to Vascular Surgery  Gangrene of finger (HCC) -     Ambulatory referral to Vascular Surgery -     CMP14+EGFR -     CBC with Differential/Platelet  Microcytic anemia  Polycythemia vera (HCC)  History of CVA (cerebrovascular accident)  Amputee, above knee, right (HCC)  Bowel and bladder incontinence     Continue all other maintenance medications.  Follow up plan: Return in about 4 weeks (around 06/09/2024) for Chronic Condition Follow up.   Continue healthy lifestyle choices, including diet (rich in fruits, vegetables, and lean proteins, and low in salt and simple carbohydrates) and exercise (at least 30 minutes of moderate physical activity daily).  Written and verbal instructions provided   The above assessment and management plan was discussed with the patient. The patient verbalized understanding of and has agreed to the management plan. Patient is aware to call the clinic if they develop any new symptoms or if symptoms persist or worsen. Patient is aware when to return to the clinic for a follow-up visit. Patient educated on when it is appropriate to go to the emergency department.   Jacqualyn Mates, DNP-FNP [facility] [address] [place], [place] [address] [phone]

## 2024-05-16 ENCOUNTER — Telehealth: Payer: Self-pay

## 2024-05-16 NOTE — Telephone Encounter (Signed)
 Copied from CRM [id number]. Topic: Clinical - Prescription Issue >> May 16, 2024  1:38 PM Georgeann Kindred wrote: Reason for CRM: Darlene, [facility], called and stated that the new medication of lisinopril  (ZESTRIL ) 10 MG tablet  has a reaction to escitalopram  (LEXAPRO ) 20 MG tablet. It has a reaction of 2 as the severity warning. Please contact patient at [phone] or Home Health Occupational Nurse, Estel Heir, at [phone] for additional information. Began medication on Saturday 05/24 and so far there has been no reaction to the medication.

## 2024-05-17 ENCOUNTER — Telehealth: Payer: Self-pay | Admitting: *Deleted

## 2024-05-17 NOTE — Telephone Encounter (Signed)
 Pt says these were water  blisters about 2-3 inches above her right ankle that popped and clear liquid came out they are all good and scabbed over, there are no blood blisters, they are the scabs from the blisters, nothing to worry about.

## 2024-05-17 NOTE — Telephone Encounter (Signed)
 TC from [place] nurse w/ [facility] Saw pt yesterday has one blister that has popped and is oozing, then has 3 blood blisters Would you want to dress w/ a large foam dressing or other suggestion Please advise

## 2024-05-18 NOTE — Telephone Encounter (Signed)
 Informed Darlene from home health. Verbalized understanding

## 2024-05-18 NOTE — Telephone Encounter (Signed)
 Pt states blisters are doing better, informed pt if they get worse to call and make an appt. Pt verbalized understanding

## 2024-05-22 ENCOUNTER — Other Ambulatory Visit: Payer: Self-pay | Admitting: *Deleted

## 2024-05-22 DIAGNOSIS — I6523 Occlusion and stenosis of bilateral carotid arteries: Secondary | ICD-10-CM

## 2024-05-31 NOTE — Progress Notes (Signed)
 Office Note     CC:  Subclavian stenosis with gangrene of the finger  Requesting Provider:  Chrystine Crate*  HPI: [name] is a 73 y.o. (16-Jun-1951) female presenting at the request of .Chrystine Crate, FNP for evaluation of subclavian stenosis with gangrene of the finger.  On exam today, [name] was doing well, accompanied by her daughter.   Upon chart review, there appears to be confusion regarding subclavian stenosis with gangrene of the finger: The patient was treated on 02/27/2024 at [facility] by Dr. Tora Freeman with subclavian artery stenting.  She was seen by orthopedic surgery at that time as well with plans to allow for demarcation.  She is also seen [facility] orthopedic surgery in the outpatient setting with continued plans for demarcation.  She has seen our office previously having undergone right sided above-knee amputation. Carotid disease and lower extremity arterial disease is being followed by cardiology, and last check was 6 months ago with plans for continued medical management.  She notes dry gangrene.  No erythema, no induration of the left fifth digit.   Past Medical History:  Diagnosis Date   Acute blood loss anemia 05/04/2020   Arthritis    fingers, some in my knees (01/28/2016)   CAD (coronary artery disease), per cath 04/27/13, non obstructive disease, EF 65-70% 05/11/2013   Carotid disease, bilateral (HCC) 09/20/2013   Lt carotid 50-69% stentosis, less on rt   Cellulitis of left leg 01/16/2022   Dysrhythmia    Elevated troponin 05/04/2020   Encounter for support and coordination of transition of care 11/11/2021   Fatty liver    GERD (gastroesophageal reflux disease)    Glaucoma    H/O cardiovascular stress test 12/27/2009   negative for ischemia   Headache    occasional since eye pressure regulated (01/28/2016)   Hospital discharge follow-up 10/15/2021   Hyperlipidemia    Hypertension    Hypertensive emergency 11/28/2022   Leukocytosis  07/15/2015   Migraine    stopped w/laser holes to relieve pressure in my eyes; had them 3-4 times/wk before taht (01/28/2016)   Mitral regurgitation,2+ by cath 05/11/2013   NSTEMI (non-ST elevated myocardial infarction) (HCC) 04/27/2013   Pain    BOTH KNEES - PT HAS TORN MENISCUS LEFT KNEE   Pain in both wrists 09/03/2021   Pain in left knee 10/03/2018   Paroxysmal atrial fibrillation (HCC)    hx of a fib on ASA  AND ELIQUIS     Polycythemia vera (HCC) 08/20/2015   JAK-2 positive 07/19/15   Prosthesis adjustment 01/13/2019   PVD (peripheral vascular disease) with claudication (HCC) 07/21/2006   previous L ext iliac stent and rt SFA stent, known occluded Lt SFA   S/P cardiac cath 04/27/2013   NON OBSTRUCTIVE DISEASE, 2+ mr, ef 65-70%   Statin intolerance    Stroke (HCC) 12/21/2004   SWELLING ALL OVER AND RT SIDE OF FACE DRAWN AND SPEECH SLURRED AND NUMBNESS ON RIGHT SIDE-- ALL RESOLVED   TIA (transient ischemic attack)     Past Surgical History:  Procedure Laterality Date   AMPUTATION Right 05/21/2017   Procedure: AMPUTATION ABOVE KNEE;  Surgeon: Mayo Speck, MD;  Location: [facility] OR;  Service: Vascular;  Laterality: Right;   CARDIAC CATHETERIZATION  04/27/13   non occlusive disease with mild to mod. calcified lesions in ostial LM and proximal LAD and moderate prox. RC AND DISTAL AV GROOVE LCX, ef   CATARACT EXTRACTION W/PHACO Right 03/21/2015   Procedure: CATARACT EXTRACTION PHACO AND  INTRAOCULAR LENS PLACEMENT RIGHT EYE;  Surgeon: Tarri Farm, MD;  Location: [facility] ORS;  Service: Ophthalmology;  Laterality: Right;  CDE:5.10   CATARACT EXTRACTION W/PHACO Left 05/16/2015   Procedure: CATARACT EXTRACTION PHACO AND INTRAOCULAR LENS PLACEMENT (IOC);  Surgeon: Tarri Farm, MD;  Location: [facility] ORS;  Service: Ophthalmology;  Laterality: Left;  CDE 5.57   CESAREAN SECTION  1977   COLONOSCOPY N/A 09/11/2016   Procedure: COLONOSCOPY;  Surgeon: Ruby Corporal, MD;  Location: [facility] ENDO SUITE;  Service:  Endoscopy;  Laterality: N/A;  730-moved to 1:00 Ann notified pt   EMBOLECTOMY Right 02/01/2016   Procedure: Thrombectomy of Right Femoral-Popliteal Bypass Graft; Endarterectomy of Right Below Knee Popliteal Artery and Tibial Peroneal Trunk with Bovine Pericardium Patch Angioplasty.;  Surgeon: Dannis Dy, MD;  Location: [facility] OR;  Service: Vascular;  Laterality: Right;   FEMORAL-POPLITEAL BYPASS GRAFT Right 01/31/2016   Procedure: BYPASS GRAFT RIGHT COMMON  FEMORALTO BELOW KNEE POPLITEAL ARTERY BYPASS GRAFT USING PROPATEN GORTEX GRAFT;  Surgeon: Palma Bob, MD;  Location: [facility] OR;  Service: Vascular;  Laterality: Right;   FEMORAL-POPLITEAL BYPASS GRAFT Right 01/09/2017   Procedure: THROMBECTOMY OF RIGHT FEMORAL-POPLITEAL  ARTERY BYPASS GRAFT; THROMBECTOMY RIGHT  TIBIAL VESSELS;  Surgeon: Richrd Char, MD;  Location: [facility] OR;  Service: Vascular;  Laterality: Right;   FEMORAL-POPLITEAL BYPASS GRAFT Right 05/17/2017   Procedure: RIGHT FEMORAL-TIBIAL PERONEAL TRUNK ARTERY BYPASS GRAFT USING 58mmX80cm PROPATEN GRAFT WITH REMOVABLE RING;  Surgeon: Mayo Speck, MD;  Location: [facility] OR;  Service: Vascular;  Laterality: Right;   FRACTURE SURGERY     ILIAC ARTERY STENT  07/2006   external iliac and Rt SFA stent 07/2006 AND THE LEFT WAS IN 2006   KNEE ARTHROSCOPY WITH MEDIAL MENISECTOMY Left 03/27/2014   Procedure: left knee arthorscopy with medial chondraplasty of the medial femoral and patella, medial microfracture technique of medial femoral condyl;  Surgeon: Florencia Hunter, MD;  Location: [facility] ORS;  Service: Orthopedics;  Laterality: Left;   LEFT HEART CATHETERIZATION WITH CORONARY ANGIOGRAM Right 04/27/2013   Procedure: LEFT HEART CATHETERIZATION WITH CORONARY ANGIOGRAM;  Surgeon: Arleen Lacer, MD;  Location[facility] C[facility] LAB;  Service: Cardiovascular;  Laterality: Right;   LOWER EXTREMITY ANGIOGRAM Right 01/31/2016   Procedure: INTRAOP RIGHT LOWER EXTREMITY ANGIOGRAM;  Surgeon: Palma Bob, MD;   Location: [facility] OR;  Service: Vascular;  Laterality: Right;   ORIF WRIST FRACTURE Right 1983   OVARIAN CYST REMOVAL Right    PATCH ANGIOPLASTY Right 01/31/2016   Procedure: RIGHT COMMON FEMORAL AND PROFUNDA FEMORIS ENDARECTOMY WITH PATCH ANGIOPLASTY;  Surgeon: Palma Bob, MD;  Location: [facility] OR;  Service: Vascular;  Laterality: Right;   PATCH ANGIOPLASTY Right 01/09/2017   Procedure: PATCH ANGIOPLASTY RIGHT POPLITEAL ARTERY BYPASS GRAFT;  Surgeon: Richrd Char, MD;  Location: [facility] OR;  Service: Vascular;  Laterality: Right;   PERIPHERAL VASCULAR CATHETERIZATION N/A 06/27/2015   Procedure: Lower Extremity Angiography;  Surgeon: Avanell Leigh, MD;  Location: [facility] INVASIVE CV LAB;  Service: Cardiovascular;  Laterality: N/A;   PERIPHERAL VASCULAR CATHETERIZATION Bilateral 01/29/2016   Procedure: Lower Extremity Angiography;  Surgeon: Wenona [place], MD;  Location: [facility] INVASIVE CV LAB;  Service: Cardiovascular;  Laterality: Bilateral;   PERIPHERAL VASCULAR CATHETERIZATION N/A 01/29/2016   Procedure: Abdominal Aortogram;  Surgeon: Wenona [place], MD;  Location: [facility] INVASIVE CV LAB;  Service: Cardiovascular;  Laterality: N/A;   REFRACTIVE SURGERY Bilateral    6 in one eye; 7 in the other; to relieve  pressure; not glaucoma (01/28/2016)   SFA Right 06/27/2015   overlapping Bahn covered stents   TUBAL LIGATION  1983   VEIN HARVEST Left 05/17/2017   Procedure: LEFT LEG GREATER SAPHENOUS VEIN HARVEST;  Surgeon: Mayo Speck, MD;  Location: [facility] OR;  Service: Vascular;  Laterality: Left;   VEIN REPAIR Right 01/31/2016   Procedure: RIGHT GREATER SAPHENOUS VEIN EXAMNED BUT NOT REMOVED;  Surgeon: Palma Bob, MD;  Location: [facility] OR;  Service: Vascular;  Laterality: Right;    Social History   Socioeconomic History   Marital status: Widowed    Spouse name: Not on file   Number of children: 2   Years of education: 66   Highest education level: GED or equivalent  Occupational History   Occupation: CNA   Tobacco Use   Smoking status: Former    Current packs/day: 0.00    Average packs/day: 1 pack/day for 45.0 years (45.0 ttl pk-yrs)    Types: Cigarettes    Start date: 11/12/1967    Quit date: 11/11/2012    Years since quitting: 11.5   Smokeless tobacco: Never   Tobacco comments:    quit 4 years ago.    10/27/21-Pt declines lung ca screen at this time.   Vaping Use   Vaping status: Never Used  Substance and Sexual Activity   Alcohol  use: No    Alcohol /week: 0.0 standard drinks of alcohol    Drug use: No   Sexual activity: Not Currently    Birth control/protection: None  Other Topics Concern   Not on file  Social History Narrative   Husband passed away after CVA in 06/23/05.   2 children, 5 grandchildren and 1 great grandchild.   Social Drivers of Corporate investment banker Strain: Low Risk  (02/10/2024)   Received from [facility]   Overall Financial Resource Strain (CARDIA)    Difficulty of Paying Living Expenses: Not hard at all  Food Insecurity: No Food Insecurity (02/25/2024)   Received from [facility]   Hunger Vital Sign    Worried About Running Out of Food in the Last Year: Never true    Ran Out of Food in the Last Year: Never true  Transportation Needs: No Transportation Needs (02/28/2024)   Received from [facility] - Transportation    Lack of Transportation (Medical): No    Lack of Transportation (Non-Medical): No  Physical Activity: Inactive (02/10/2024)   Received from [facility]   Exercise Vital Sign    Days of Exercise per Week: 0 days    Minutes of Exercise per Session: 0 min  Stress: No Stress Concern Present (02/25/2024)   Received from [facility] - Occupational Stress Questionnaire    Feeling of Stress : Not at all  Social Connections: Moderately Integrated (02/10/2024)   Received from [facility]   Social Connection and Isolation Panel [NHANES]    Frequency of Communication with  Friends and Family: More than three times a week    Frequency of Social Gatherings with Friends and Family: More than three times a week    Attends Religious Services: More than 4 times per year    Active Member of Clubs or Organizations: No    Attends Banker Meetings: 1 to 4 times per year    Marital Status: Widowed  Recent Concern: Social Connections - Moderately Isolated (12/01/2023)   Social Connection and Isolation Panel [NHANES]  Frequency of Communication with Friends and Family: More than three times a week    Frequency of Social Gatherings with Friends and Family: Three times a week    Attends Religious Services: 1 to 4 times per year    Active Member of Clubs or Organizations: No    Attends Banker Meetings: Never    Marital Status: Widowed  Intimate Partner Violence: Not At Risk (02/25/2024)   Received from [facility]   HITS    Over the last 12 months how often did your partner physically hurt you?: Never    Over the last 12 months how often did your partner insult you or talk down to you?: Never    Over the last 12 months how often did your partner threaten you with physical harm?: Never    Over the last 12 months how often did your partner scream or curse at you?: Never   Family History  Problem Relation Age of Onset   CAD Mother    Lung cancer Mother    Heart attack Mother    CAD Father    Heart disease Father    Heart attack Father 31       died in hes 10's with heart disease   Healthy Sister    Liver cancer Brother    Healthy Sister    CAD Brother        has had CABG   CAD Brother     Current Outpatient Medications  Medication Sig Dispense Refill   alendronate  (FOSAMAX ) 70 MG tablet Take 1 tablet (70 mg total) by mouth once a week. 12 tablet 0   amLODipine  (NORVASC ) 10 MG tablet Take 1 tablet (10 mg total) by mouth daily. 90 tablet 0   apixaban  (ELIQUIS ) 2.5 MG TABS tablet Take 1 tablet (2.5 mg total) by mouth 2 (two) times  daily. 180 tablet 0   aspirin  EC 325 MG tablet Take 325 mg by mouth daily.     Calcium  Carbonate (CALTRATE 600 PO) Take by mouth.     Colchicine  0.6 MG CAPS Take 1 capsule (0.6 mg total) by mouth daily. 90 capsule 0   diclofenac  Sodium (VOLTAREN ) 1 % GEL Apply 2 g topically 4 (four) times daily as needed (pain).     escitalopram  (LEXAPRO ) 20 MG tablet Take 1 tablet (20 mg total) by mouth daily. 90 tablet 0   ezetimibe  (ZETIA ) 10 MG tablet Take 1 tablet (10 mg total) by mouth daily. 90 tablet 0   FARXIGA  5 MG TABS tablet Take 1 tablet (5 mg total) by mouth daily. 90 tablet 0   fenofibrate  (TRICOR ) 145 MG tablet Take 1 tablet (145 mg total) by mouth daily. (Patient taking differently: Take 134 mg by mouth daily.) 90 tablet 3   flecainide  (TAMBOCOR ) 50 MG tablet Take 1 tablet (50 mg total) by mouth 2 (two) times daily. 60 tablet 0   HYDROcodone -acetaminophen  (NORCO/VICODIN) 5-325 MG tablet Take 1 tablet by mouth 2 (two) times daily as needed.     ipratropium-albuterol  (DUONEB) 0.5-2.5 (3) MG/3ML SOLN Take 3 mLs by nebulization.     lisinopril  (ZESTRIL ) 10 MG tablet Take 1 tablet (10 mg total) by mouth daily. 90 tablet 0   methocarbamol (ROBAXIN) 500 MG tablet Take by mouth.     pantoprazole  (PROTONIX ) 40 MG tablet Take 1 tablet (40 mg total) by mouth daily. 90 tablet 0   zinc  oxide 20 % ointment Apply 1 Application topically as needed for irritation.  No current facility-administered medications for this visit.    Allergies  Allergen Reactions   Atenolol Rash and Itching   Demerol   [Meperidine  Hcl] Itching   Demerol  [Meperidine ] Itching and Other (See Comments)    Unknown, can't remember    Gabapentin  Anxiety and Other (See Comments)    SI and HI   Inderal [Propranolol] Other (See Comments) and Itching    Doesn't remember    Prednisone Other (See Comments)    Muscle spasms   Statins Itching and Other (See Comments)    Simvastatin  - caused severe itching Pravastatin  - caused lesser  tiching One type of statin? Caused stroke in 2006, and other symptoms as result   Warfarin And Related Other (See Comments)    Caused nose bleeds   Crestor [Rosuvastatin] Itching and Rash   Docosahexaenoic Acid (Dha) (Fish)     nosebleeds   Lactose Intolerance (Gi) Other (See Comments)    Bloating, gas   Lipitor [Atorvastatin ] Rash   Lovaza [Omega-3-Acid Ethyl Esters (Fish)] Other (See Comments)    nosebleeds    Penicillins Hives, Rash and Other (See Comments)    Immediate rash, facial/tongue/throat swelling, SOB or lightheadedness with hypotension Questionable high fever    Pravastatin  Itching and Rash   Simvastatin  Itching, Rash and Other (See Comments)   Trilipix [Choline Fenofibrate ] Other (See Comments)    Doesn't remember    Warfarin Other (See Comments)    nosebleeds   Hydralazine  Swelling   Effexor [Venlafaxine Hcl] Other (See Comments)    Doesn't remember    Nexium [Esomeprazole Magnesium ] Rash   Percocet [Oxycodone -Acetaminophen ] Other (See Comments)    Doesn't remember    Plavix  [Clopidogrel  Bisulfate] Rash     REVIEW OF SYSTEMS:  [X]  denotes positive finding, [ ]  denotes negative finding Cardiac  Comments:  Chest pain or chest pressure:    Shortness of breath upon exertion:    Short of breath when lying flat:    Irregular heart rhythm:        Vascular    Pain in calf, thigh, or hip brought on by ambulation:    Pain in feet at night that wakes you up from your sleep:     Blood clot in your veins:    Leg swelling:         Pulmonary    Oxygen at home:    Productive cough:     Wheezing:         Neurologic    Sudden weakness in arms or legs:     Sudden numbness in arms or legs:     Sudden onset of difficulty speaking or slurred speech:    Temporary loss of vision in one eye:     Problems with dizziness:         Gastrointestinal    Blood in stool:     Vomited blood:         Genitourinary    Burning when urinating:     Blood in urine:         Psychiatric    Major depression:         Hematologic    Bleeding problems:    Problems with blood clotting too easily:        Skin    Rashes or ulcers:        Constitutional    Fever or chills:      PHYSICAL EXAMINATION:  There were no vitals filed for this visit.  General:  WDWN in  NAD; vital signs documented above Gait: Not observed HENT: WNL, normocephalic Pulmonary: normal non-labored breathing , without wheezing Cardiac: regular HR Abdomen: soft, NT, no masses Skin: without rashes Vascular Exam/Pulses:  Right Left  Radial 2+ (normal) 2+ (normal)  Ulnar                     Extremities: without ischemic changes, without Gangrene , without cellulitis; with open wounds;  Patient picks at pretibial wounds on the left calf. Musculoskeletal: no muscle wasting or atrophy  Neurologic: A&O X 3;  No focal weakness or paresthesias are detected Psychiatric:  The pt has Normal affect.   Non-Invasive Vascular Imaging:    Left Doppler Findings:  +---------------+----------+---------+--------+--------+  Site          PSV (cm/s)Waveform StenosisComments  +---------------+----------+---------+--------+--------+  Subclavian Prox141       biphasic                   +---------------+----------+---------+--------+--------+  Subclavian Mid 121       biphasic                   +---------------+----------+---------+--------+--------+  Subclavian Dist126       biphasic                   +---------------+----------+---------+--------+--------+  Axillary      78        biphasic                   +---------------+----------+---------+--------+--------+  Brachial Prox  133       biphasic                   +---------------+----------+---------+--------+--------+  Brachial Mid   143       biphasic                   +---------------+----------+---------+--------+--------+  Brachial Dist  88        biphasic                    +---------------+----------+---------+--------+--------+  Radial Prox    97        triphasic                  +---------------+----------+---------+--------+--------+  Radial Mid     133       triphasic                  +---------------+----------+---------+--------+--------+  Radial Dist    96        triphasic                  +---------------+----------+---------+--------+--------+  Ulnar Prox     83        triphasic                  +---------------+----------+---------+--------+--------+  Ulnar Mid      91        triphasic                  +---------------+----------+---------+--------+--------+  Ulnar Dist     97        triphasic                  +---------------+----------+---------+--------+--------+    ASSESSMENT/PLAN: [name] is a 73 y.o. female presenting with demarcation of left fifth digit status post left subclavian artery stenting by Dr. Sulema Endo at [facility].  I had a long conversation with Ciarrah and  her daughter as we were all confused why she was in our office today. She was relieved to hear that the stent was widely patent, and that I had no concerns for infection of the left fifth digit.  The lesion has demarcated nicely and needs amputation.  At this point, I think she is best served with continuity at [facility] health as she recently had a stent placed, and is being followed for wound demarcation.  Her daughter plans to call the orthopedic surgeon to discuss amputation.  I asked her to keep her appointment with Dr. Sulema Endo out of professional courtesy as he placed the left sided subclavian artery stent.  From a cerebral and peripheral vascular standpoint, she is followed by Dr. Austin Blumenthal in the [facility] health system.she is aware that I am happy to see her in the future should any needs or concerns arise.  At this time, she can follow-up with me as needed.   Kayla Part, MD [facility] [phone]

## 2024-06-01 ENCOUNTER — Other Ambulatory Visit: Payer: Self-pay | Admitting: Vascular Surgery

## 2024-06-01 ENCOUNTER — Encounter: Payer: Self-pay | Admitting: Vascular Surgery

## 2024-06-01 ENCOUNTER — Ambulatory Visit: Attending: Vascular Surgery | Admitting: Vascular Surgery

## 2024-06-01 ENCOUNTER — Ambulatory Visit (HOSPITAL_COMMUNITY)
Admission: RE | Admit: 2024-06-01 | Discharge: 2024-06-01 | Disposition: A | Discharge status: Home / Self Care | Source: Ambulatory Visit | Attending: Vascular Surgery | Admitting: Vascular Surgery

## 2024-06-01 VITALS — BP 171/72 | HR 84 | Temp 98.3°F | Resp 20 | Ht 65.0 in | Wt 100.0 lb

## 2024-06-01 DIAGNOSIS — I771 Stricture of artery: Secondary | ICD-10-CM

## 2024-06-01 DIAGNOSIS — I96 Gangrene, not elsewhere classified: Secondary | ICD-10-CM

## 2024-06-05 ENCOUNTER — Other Ambulatory Visit: Payer: Self-pay | Admitting: Cardiovascular Disease

## 2024-06-06 ENCOUNTER — Telehealth: Payer: Self-pay

## 2024-06-06 NOTE — Telephone Encounter (Signed)
 Attempted to call pt to inform her insurance will not cover a hospital bed for her. She may be able to call her insurance to see if they will cover it being sent somewhere else or if there is certain requirements she has to meet. No answer and vm was full.

## 2024-06-14 ENCOUNTER — Encounter: Payer: Self-pay | Admitting: Family Medicine

## 2024-06-14 ENCOUNTER — Ambulatory Visit: Admitting: Family Medicine

## 2024-06-14 VITALS — BP 108/61 | HR 81 | Temp 98.5°F | Ht 65.0 in | Wt 98.4 lb

## 2024-06-14 DIAGNOSIS — S68117D Complete traumatic metacarpophalangeal amputation of left little finger, subsequent encounter: Secondary | ICD-10-CM | POA: Diagnosis not present

## 2024-06-14 DIAGNOSIS — R0902 Hypoxemia: Secondary | ICD-10-CM

## 2024-06-14 DIAGNOSIS — Z8673 Personal history of transient ischemic attack (TIA), and cerebral infarction without residual deficits: Secondary | ICD-10-CM

## 2024-06-14 DIAGNOSIS — S68117A Complete traumatic metacarpophalangeal amputation of left little finger, initial encounter: Secondary | ICD-10-CM | POA: Insufficient documentation

## 2024-06-14 NOTE — Progress Notes (Signed)
 Subjective:  Patient ID: [name], female    DOB: [birth year], 73 y.o.   MRN: [id number]  Patient Care Team: Cathlene Marry Lenis, FNP as PCP - General (Family Medicine) Court Dorn PARAS, MD as PCP - Cardiology (Cardiology) Freddie Lynwood HERO, MD as Consulting Physician (Oncology) Harvey Carlin BRAVO, MD (Inactive) as Consulting Physician (Vascular Surgery) Lanny Callander, MD as Attending Physician (Hematology and Oncology) Rachele Gaynell RAMAN, MD as Consulting Physician (Nephrology)   Chief Complaint:  Medical Management of Chronic Issues  HPI: [name] is a 73 y.o. female presenting on 06/14/2024 for Medical Management of Chronic Issues  HPI She has completed amputation of left pinky finger on Monday 06/05/24. She is doing well. Continues PT weekly. She has completed evaluation of OT and speech therapy. She has a BP monitor at home but states that she does not have an outlet available to plug it in. She followed up with neurology on 06/07/2024. She was seen by ID at [facility] as well and is supposed to start abx. She has not received them yet from pharmacy.   O2 needs  Patient received home oxygen after discharge from hospital 02/01/2024.  When she is at home she is using oxygen intermittently. Daughter states that the morning  of 06/08/2024 woke up short of breath and her Oxygen at home was in the 70s. She has not followed up with pulmonology.   Relevant past medical, surgical, family, and social history reviewed and updated as indicated.  Allergies and medications reviewed and updated. Data reviewed: Chart in Epic.   Past Medical History:  Diagnosis Date   Acute blood loss anemia 05/04/2020   Allergy ?, Don't remember   Arthritis    fingers, some in my knees (01/28/2016)   Blood transfusion without reported diagnosis    During surgery   CAD (coronary artery disease), per cath 04/27/13, non obstructive disease, EF 65-70% 05/11/2013   Carotid disease, bilateral (HCC)  09/20/2013   Lt carotid 50-69% stentosis, less on rt   Cataract    Don't remember   Cellulitis of left leg 01/16/2022   Depression Don't remember   Dysrhythmia    Elevated troponin 05/04/2020   Encounter for support and coordination of transition of care 11/11/2021   Fatty liver    GERD (gastroesophageal reflux disease)    Glaucoma    H/O cardiovascular stress test 12/27/2009   negative for ischemia   Headache    occasional since eye pressure regulated (01/28/2016)   Hospital discharge follow-up 10/15/2021   Hyperlipidemia    Hypertension    Hypertensive emergency 11/28/2022   Leukocytosis 07/15/2015   Migraine    stopped w/laser holes to relieve pressure in my eyes; had them 3-4 times/wk before taht (01/28/2016)   Mitral regurgitation,2+ by cath 05/11/2013   NSTEMI (non-ST elevated myocardial infarction) (HCC) 04/27/2013   Osteoporosis IDK   Pain    BOTH KNEES - PT HAS TORN MENISCUS LEFT KNEE   Pain in both wrists 09/03/2021   Pain in left knee 10/03/2018   Paroxysmal atrial fibrillation (HCC)    hx of a fib on ASA  AND ELIQUIS     Polycythemia vera (HCC) 08/20/2015   JAK-2 positive 07/19/15   Prosthesis adjustment 01/13/2019   PVD (peripheral vascular disease) with claudication (HCC) 07/21/2006   previous L ext iliac stent and rt SFA stent, known occluded Lt SFA   S/P cardiac cath 04/27/2013   NON OBSTRUCTIVE DISEASE, 2+ mr, ef 65-70%   Statin intolerance  Stroke (HCC) 12/21/2004   SWELLING ALL OVER AND RT SIDE OF FACE DRAWN AND SPEECH SLURRED AND NUMBNESS ON RIGHT SIDE-- ALL RESOLVED   TIA (transient ischemic attack)     Past Surgical History:  Procedure Laterality Date   ABDOMINAL HYSTERECTOMY  1977   After childbirth   AMPUTATION Right 05/21/2017   Procedure: AMPUTATION ABOVE KNEE;  Surgeon: Oris Krystal FALCON, MD;  Location: [facility] OR;  Service: Vascular;  Laterality: Right;   CARDIAC CATHETERIZATION  04/27/2013   non occlusive disease with mild to mod. calcified  lesions in ostial LM and proximal LAD and moderate prox. RC AND DISTAL AV GROOVE LCX, ef   CATARACT EXTRACTION W/PHACO Right 03/21/2015   Procedure: CATARACT EXTRACTION PHACO AND INTRAOCULAR LENS PLACEMENT RIGHT EYE;  Surgeon: Lynwood Hermann, MD;  Location: [facility] ORS;  Service: Ophthalmology;  Laterality: Right;  CDE:5.10   CATARACT EXTRACTION W/PHACO Left 05/16/2015   Procedure: CATARACT EXTRACTION PHACO AND INTRAOCULAR LENS PLACEMENT (IOC);  Surgeon: Lynwood Hermann, MD;  Location: [facility] ORS;  Service: Ophthalmology;  Laterality: Left;  CDE 5.57   CESAREAN SECTION  12/22/1975   COLONOSCOPY N/A 09/11/2016   Procedure: COLONOSCOPY;  Surgeon: Claudis RAYMOND Rivet, MD;  Location: [facility] ENDO SUITE;  Service: Endoscopy;  Laterality: N/A;  730-moved to 1:00 Ann notified pt   EMBOLECTOMY Right 02/01/2016   Procedure: Thrombectomy of Right Femoral-Popliteal Bypass Graft; Endarterectomy of Right Below Knee Popliteal Artery and Tibial Peroneal Trunk with Bovine Pericardium Patch Angioplasty.;  Surgeon: Lonni GORMAN Blade, MD;  Location: [facility] OR;  Service: Vascular;  Laterality: Right;   EYE SURGERY     FEMORAL-POPLITEAL BYPASS GRAFT Right 01/31/2016   Procedure: BYPASS GRAFT RIGHT COMMON  FEMORALTO BELOW KNEE POPLITEAL ARTERY BYPASS GRAFT USING PROPATEN GORTEX GRAFT;  Surgeon: Lynwood JONETTA Collum, MD;  Location: [facility] OR;  Service: Vascular;  Laterality: Right;   FEMORAL-POPLITEAL BYPASS GRAFT Right 01/09/2017   Procedure: THROMBECTOMY OF RIGHT FEMORAL-POPLITEAL  ARTERY BYPASS GRAFT; THROMBECTOMY RIGHT  TIBIAL VESSELS;  Surgeon: Carlin FORBES Haddock, MD;  Location: [facility] OR;  Service: Vascular;  Laterality: Right;   FEMORAL-POPLITEAL BYPASS GRAFT Right 05/17/2017   Procedure: RIGHT FEMORAL-TIBIAL PERONEAL TRUNK ARTERY BYPASS GRAFT USING 68mmX80cm PROPATEN GRAFT WITH REMOVABLE RING;  Surgeon: Oris Krystal FALCON, MD;  Location: [facility] OR;  Service: Vascular;  Laterality: Right;   FRACTURE SURGERY     ILIAC ARTERY STENT  07/21/2006   external  iliac and Rt SFA stent 07/2006 AND THE LEFT WAS IN 2006   KNEE ARTHROSCOPY WITH MEDIAL MENISECTOMY Left 03/27/2014   Procedure: left knee arthorscopy with medial chondraplasty of the medial femoral and patella, medial microfracture technique of medial femoral condyl;  Surgeon: Tanda DELENA Heading, MD;  Location: [facility] ORS;  Service: Orthopedics;  Laterality: Left;   LEFT HEART CATHETERIZATION WITH CORONARY ANGIOGRAM Right 04/27/2013   Procedure: LEFT HEART CATHETERIZATION WITH CORONARY ANGIOGRAM;  Surgeon: Alm LELON Clay, MD;  Location: [facility] CATH LAB;  Service: Cardiovascular;  Laterality: Right;   LOWER EXTREMITY ANGIOGRAM Right 01/31/2016   Procedure: INTRAOP RIGHT LOWER EXTREMITY ANGIOGRAM;  Surgeon: Lynwood JONETTA Collum, MD;  Location: [facility] OR;  Service: Vascular;  Laterality: Right;   ORIF WRIST FRACTURE Right 12/21/1981   OVARIAN CYST REMOVAL Right    PATCH ANGIOPLASTY Right 01/31/2016   Procedure: RIGHT COMMON FEMORAL AND PROFUNDA FEMORIS ENDARECTOMY WITH PATCH ANGIOPLASTY;  Surgeon: Lynwood JONETTA Collum, MD;  Location: [facility] OR;  Service: Vascular;  Laterality: Right;   PATCH ANGIOPLASTY Right 01/09/2017   Procedure: PATCH ANGIOPLASTY  RIGHT POPLITEAL ARTERY BYPASS GRAFT;  Surgeon: Carlin FORBES Haddock, MD;  Location: [facility] OR;  Service: Vascular;  Laterality: Right;   PERIPHERAL VASCULAR CATHETERIZATION N/A 06/27/2015   Procedure: Lower Extremity Angiography;  Surgeon: Dorn JINNY Lesches, MD;  Location: [facility] INVASIVE CV LAB;  Service: Cardiovascular;  Laterality: N/A;   PERIPHERAL VASCULAR CATHETERIZATION Bilateral 01/29/2016   Procedure: Lower Extremity Angiography;  Surgeon: Deatrice DELENA Cage, MD;  Location: [facility] INVASIVE CV LAB;  Service: Cardiovascular;  Laterality: Bilateral;   PERIPHERAL VASCULAR CATHETERIZATION N/A 01/29/2016   Procedure: Abdominal Aortogram;  Surgeon: Deatrice DELENA Cage, MD;  Location: [facility] INVASIVE CV LAB;  Service: Cardiovascular;  Laterality: N/A;   REFRACTIVE SURGERY Bilateral    6 in one eye; 7 in  the other; to relieve pressure; not glaucoma (01/28/2016)   SFA Right 06/27/2015   overlapping Bahn covered stents   TUBAL LIGATION  12/21/1981   VEIN HARVEST Left 05/17/2017   Procedure: LEFT LEG GREATER SAPHENOUS VEIN HARVEST;  Surgeon: Oris Krystal FALCON, MD;  Location: [facility] OR;  Service: Vascular;  Laterality: Left;   VEIN REPAIR Right 01/31/2016   Procedure: RIGHT GREATER SAPHENOUS VEIN EXAMNED BUT NOT REMOVED;  Surgeon: Lynwood JONETTA Collum, MD;  Location: [facility] OR;  Service: Vascular;  Laterality: Right;    Social History   Socioeconomic History   Marital status: Widowed    Spouse name: Not on file   Number of children: 2   Years of education: 62   Highest education level: GED or equivalent  Occupational History   Occupation: CNA  Tobacco Use   Smoking status: Former    Current packs/day: 0.00    Average packs/day: 1 pack/day for 45.0 years (45.0 ttl pk-yrs)    Types: Cigarettes    Start date: 11/12/1967    Quit date: 11/11/2012    Years since quitting: 11.5   Smokeless tobacco: Never   Tobacco comments:    quit 4 years ago.    10/27/21-Pt declines lung ca screen at this time.   Vaping Use   Vaping status: Never Used  Substance and Sexual Activity   Alcohol  use: No    Alcohol /week: 0.0 standard drinks of alcohol    Drug use: No   Sexual activity: Not Currently    Birth control/protection: None  Other Topics Concern   Not on file  Social History Narrative   Husband passed away after CVA in Jun 21, 2005.   2 children, 5 grandchildren and 1 great grandchild.   Social Drivers of Corporate investment banker Strain: Low Risk  (06/07/2024)   Received [facility]   Overall Financial Resource Strain (CARDIA)    Difficulty of Paying Living Expenses: Not hard at all  Food Insecurity: No Food Insecurity (06/07/2024)   Received from [facility]   Hunger Vital Sign    Within the past 12 months, you worried that your food would run out before you got the money to buy more.: Never true     Within the past 12 months, the food you bought just didn't last and you didn't have money to get more.: Never true  Transportation Needs: No Transportation Needs (06/07/2024)   Received from [facility] - Transportation    Lack of Transportation (Medical): No    Lack of Transportation (Non-Medical): No  Physical Activity: Inactive (02/10/2024)   Received from [facility]   Exercise Vital Sign    On average, how many days per week do you engage in moderate to strenuous  exercise (like a brisk walk)?: 0 days    On average, how many minutes do you engage in exercise at this level?: 0 min  Stress: No Stress Concern Present (06/06/2024)   Received from [facility] - Occupational Stress Questionnaire    Feeling of Stress : Not at all  Social Connections: Moderately Integrated (02/10/2024)   Received from [facility]   Social Connection and Isolation Panel    In a typical week, how many times do you talk on the phone with family, friends, or neighbors?: More than three times a week    How often do you get together with friends or relatives?: More than three times a week    How often do you attend church or religious services?: More than 4 times per year    Do you belong to any clubs or organizations such as church groups, unions, fraternal or athletic groups, or school groups?: No    How often do you attend meetings of the clubs or organizations you belong to?: 1 to 4 times per year    Are you married, widowed, divorced, separated, never married, or living with a partner?: Widowed  Recent Concern: Social Connections - Moderately Isolated (12/01/2023)   Social Connection and Isolation Panel    Frequency of Communication with Friends and Family: More than three times a week    Frequency of Social Gatherings with Friends and Family: Three times a week    Attends Religious Services: 1 to 4 times per year    Active Member of Clubs or  Organizations: No    Attends Banker Meetings: Never    Marital Status: Widowed  Intimate Partner Violence: Not At Risk (06/06/2024)   Received from [facility]   HITS    Over the last 12 months how often did your partner physically hurt you?: Never    Over the last 12 months how often did your partner insult you or talk down to you?: Never    Over the last 12 months how often did your partner threaten you with physical harm?: Never    Over the last 12 months how often did your partner scream or curse at you?: Never    Outpatient Encounter Medications as of 06/14/2024  Medication Sig   alendronate  (FOSAMAX ) 70 MG tablet Take 1 tablet (70 mg total) by mouth once a week.   amLODipine  (NORVASC ) 10 MG tablet Take 1 tablet (10 mg total) by mouth daily.   apixaban  (ELIQUIS ) 2.5 MG TABS tablet Take 1 tablet (2.5 mg total) by mouth 2 (two) times daily.   aspirin  EC 325 MG tablet Take 325 mg by mouth daily.   Calcium  Carbonate (CALTRATE 600 PO) Take by mouth.   Colchicine  0.6 MG CAPS Take 1 capsule (0.6 mg total) by mouth daily.   diclofenac  Sodium (VOLTAREN ) 1 % GEL Apply 2 g topically 4 (four) times daily as needed (pain).   escitalopram  (LEXAPRO ) 20 MG tablet Take 1 tablet (20 mg total) by mouth daily.   ezetimibe  (ZETIA ) 10 MG tablet Take 1 tablet (10 mg total) by mouth daily.   FARXIGA  5 MG TABS tablet Take 1 tablet (5 mg total) by mouth daily.   fenofibrate  (TRICOR ) 145 MG tablet Take 1 tablet (145 mg total) by mouth daily. (Patient taking differently: Take 134 mg by mouth daily.)   flecainide  (TAMBOCOR ) 50 MG tablet Take 1 tablet (50 mg total) by mouth 2 (two) times daily.  HYDROcodone -acetaminophen  (NORCO/VICODIN) 5-325 MG tablet Take 1 tablet by mouth 2 (two) times daily as needed.   ipratropium-albuterol  (DUONEB) 0.5-2.5 (3) MG/3ML SOLN Take 3 mLs by nebulization.   lisinopril  (ZESTRIL ) 10 MG tablet Take 1 tablet (10 mg total) by mouth daily.   methocarbamol (ROBAXIN) 500  MG tablet Take by mouth.   ondansetron  (ZOFRAN ) 4 MG tablet Take 4 mg by mouth.   pantoprazole  (PROTONIX ) 40 MG tablet Take 1 tablet (40 mg total) by mouth daily.   sulfamethoxazole-trimethoprim (BACTRIM DS) 800-160 MG tablet Take 1 tablet by mouth 2 (two) times daily.   zinc  oxide 20 % ointment Apply 1 Application topically as needed for irritation.   No facility-administered encounter medications on file as of 06/14/2024.    Allergies  Allergen Reactions   Atenolol Rash and Itching   Demerol   [Meperidine  Hcl] Itching   Demerol  [Meperidine ] Itching and Other (See Comments)    Unknown, can't remember    Gabapentin  Anxiety and Other (See Comments)    SI and HI   Inderal [Propranolol] Other (See Comments) and Itching    Doesn't remember    Prednisone Other (See Comments)    Muscle spasms   Statins Itching and Other (See Comments)    Simvastatin  - caused severe itching Pravastatin  - caused lesser tiching One type of statin? Caused stroke in 2006, and other symptoms as result   Warfarin And Related Other (See Comments)    Caused nose bleeds   Crestor [Rosuvastatin] Itching and Rash   Docosahexaenoic Acid (Dha) (Fish)     nosebleeds   Lactose Intolerance (Gi) Other (See Comments)    Bloating, gas   Lipitor [Atorvastatin ] Rash   Lovaza [Omega-3-Acid Ethyl Esters (Fish)] Other (See Comments)    nosebleeds    Penicillins Hives, Rash and Other (See Comments)    Immediate rash, facial/tongue/throat swelling, SOB or lightheadedness with hypotension Questionable high fever    Pravastatin  Itching and Rash   Simvastatin  Itching, Rash and Other (See Comments)   Trilipix [Choline Fenofibrate ] Other (See Comments)    Doesn't remember    Warfarin Other (See Comments)    nosebleeds   Hydralazine  Swelling   Effexor [Venlafaxine Hcl] Other (See Comments)    Doesn't remember    Nexium [Esomeprazole Magnesium ] Rash   Percocet [Oxycodone -Acetaminophen ] Other (See Comments)    Doesn't  remember    Plavix  [Clopidogrel  Bisulfate] Rash    Review of Systems As per HPI  Objective:  BP 108/61 (BP Location: Other (Comment), Patient Position: Sitting, Cuff Size: Normal) Comment (BP Location): left ankle  Pulse 81   Temp 98.5 F (36.9 C)   Ht 5' 5 (1.651 m)   Wt 98 lb 6.4 oz (44.6 kg)   SpO2 90%   BMI 16.37 kg/m    Wt Readings from Last 3 Encounters:  06/14/24 98 lb 6.4 oz (44.6 kg)  06/01/24 100 lb (45.4 kg)  05/12/24 100 lb (45.4 kg)    Physical Exam Constitutional:      General: She is awake. She is not in acute distress.    Appearance: Normal appearance. She is well-groomed. She is ill-appearing. She is not toxic-appearing or diaphoretic.     Comments: Chronically ill-appearing    Cardiovascular:     Rate and Rhythm: Normal rate and regular rhythm.     Heart sounds: Normal heart sounds.  Pulmonary:     Breath sounds: Decreased air movement present. Decreased breath sounds present.     Comments: Increased WOB  Coarse lung  sounds   Musculoskeletal:     Comments: Generalized weakness  Amputation of Right leg, AKA  Amputation of left 5th finger. Wearing bandage. Unable to assess incision.    Neurological:     Mental Status: She is alert, oriented to person, place, and time and easily aroused. Mental status is at baseline.     Comments: Much improved baseline  Remains weak on left arm.   Psychiatric:        Attention and Perception: Attention and perception normal.        Mood and Affect: Mood and affect normal.        Speech: Speech normal.        Behavior: Behavior is cooperative.        Thought Content: Thought content normal.        Judgment: Judgment normal.     Results for orders placed or performed during the hospital encounter of 01/28/24  Resp panel by RT-PCR (RSV, Flu A&B, Covid) Anterior Nasal Swab   Collection Time: 01/28/24  6:17 PM   Specimen: Anterior Nasal Swab  Result Value Ref Range   SARS Coronavirus 2 by RT PCR NEGATIVE  NEGATIVE   Influenza A by PCR NEGATIVE NEGATIVE   Influenza B by PCR NEGATIVE NEGATIVE   Resp Syncytial Virus by PCR NEGATIVE NEGATIVE  CBC with Differential   Collection Time: 01/28/24  6:27 PM  Result Value Ref Range   WBC 34.8 (H) 4.0 - 10.5 K/uL   RBC 6.44 (H) 3.87 - 5.11 MIL/uL   Hemoglobin 12.9 12.0 - 15.0 g/dL   HCT 53.1 (H) 63.9 - 53.9 %   MCV 72.7 (L) 80.0 - 100.0 fL   MCH 20.0 (L) 26.0 - 34.0 pg   MCHC 27.6 (L) 30.0 - 36.0 g/dL   RDW 77.1 (H) 88.4 - 84.4 %   Platelets 356 150 - 400 K/uL   nRBC 0.4 (H) 0.0 - 0.2 %   Neutrophils Relative % 87 %   Neutro Abs 30.2 (H) 1.7 - 7.7 K/uL   Lymphocytes Relative 3 %   Lymphs Abs 1.1 0.7 - 4.0 K/uL   Monocytes Relative 2 %   Monocytes Absolute 0.8 0.1 - 1.0 K/uL   Eosinophils Relative 1 %   Eosinophils Absolute 0.4 0.0 - 0.5 K/uL   Basophils Relative 2 %   Basophils Absolute 0.5 (H) 0.0 - 0.1 K/uL   WBC Morphology Abnormal lymphocytes present    Immature Granulocytes 5 %   Abs Immature Granulocytes 1.79 (H) 0.00 - 0.07 K/uL  Comprehensive metabolic panel   Collection Time: 01/28/24  6:27 PM  Result Value Ref Range   Sodium 136 135 - 145 mmol/L   Potassium 4.5 3.5 - 5.1 mmol/L   Chloride 109 98 - 111 mmol/L   CO2 16 (L) 22 - 32 mmol/L   Glucose, Bld 101 (H) 70 - 99 mg/dL   BUN 30 (H) 8 - 23 mg/dL   Creatinine, Ser 8.45 (H) 0.44 - 1.00 mg/dL   Calcium  8.4 (L) 8.9 - 10.3 mg/dL   Total Protein 6.1 (L) 6.5 - 8.1 g/dL   Albumin  2.9 (L) 3.5 - 5.0 g/dL   AST 20 15 - 41 U/L   ALT 12 0 - 44 U/L   Alkaline Phosphatase 90 38 - 126 U/L   Total Bilirubin 0.8 0.0 - 1.2 mg/dL   GFR, Estimated 36 (L) >60 mL/min   Anion gap 11 5 - 15  Lactic acid, plasma   Collection Time:  01/28/24  6:27 PM  Result Value Ref Range   Lactic Acid, Venous 0.7 0.5 - 1.9 mmol/L  Troponin I (High Sensitivity)   Collection Time: 01/28/24  6:27 PM  Result Value Ref Range   Troponin I (High Sensitivity) 35 (H) <18 ng/L  CBG monitoring, ED   Collection  Time: 01/28/24  6:52 PM  Result Value Ref Range   Glucose-Capillary 80 70 - 99 mg/dL  CBG monitoring, ED   Collection Time: 01/28/24  7:22 PM  Result Value Ref Range   Glucose-Capillary 70 70 - 99 mg/dL  Blood culture (routine x 2)   Collection Time: 01/28/24  7:52 PM   Specimen: Left Antecubital; Blood  Result Value Ref Range   Specimen Description LEFT ANTECUBITAL    Special Requests NONE    Culture      NO GROWTH 5 DAYS Performed at [name], [address], [place], [place] [address]    Report Status 02/02/2024 FINAL   Blood culture (routine x 2)   Collection Time: 01/28/24  7:58 PM   Specimen: BLOOD LEFT FOREARM  Result Value Ref Range   Specimen Description BLOOD LEFT FOREARM    Special Requests NONE    Culture      NO GROWTH 5 DAYS Performed at [facility], [address] [place], [place] [address]    Report Status 02/02/2024 FINAL   Troponin I (High Sensitivity)   Collection Time: 01/28/24  8:08 PM  Result Value Ref Range   Troponin I (High Sensitivity) 45 (H) <18 ng/L  Urinalysis, Routine w reflex microscopic -Urine, Clean Catch   Collection Time: 01/28/24  9:31 PM  Result Value Ref Range   Color, Urine YELLOW YELLOW   APPearance CLEAR CLEAR   Specific Gravity, Urine 1.020 1.005 - 1.030   pH 5.0 5.0 - 8.0   Glucose, UA 150 (A) NEGATIVE mg/dL   Hgb urine dipstick SMALL (A) NEGATIVE   Bilirubin Urine NEGATIVE NEGATIVE   Ketones, ur NEGATIVE NEGATIVE mg/dL   Protein, ur >=699 (A) NEGATIVE mg/dL   Nitrite NEGATIVE NEGATIVE   Leukocytes,Ua NEGATIVE NEGATIVE   RBC / HPF 0-5 0 - 5 RBC/hpf   WBC, UA 0-5 0 - 5 WBC/hpf   Bacteria, UA NONE SEEN NONE SEEN   Squamous Epithelial / HPF 0-5 0 - 5 /HPF   Mucus PRESENT   Ferritin   Collection Time: 01/29/24  3:26 AM  Result Value Ref Range   Ferritin 17 11 - 307 ng/mL  Iron and TIBC   Collection Time: 01/29/24  3:26 AM  Result Value Ref Range   Iron 15 (L) 28 - 170 ug/dL   TIBC 667 749 - 549 ug/dL   Saturation  Ratios 5 (L) 10.4 - 31.8 %   UIBC 317 ug/dL  Comprehensive metabolic panel   Collection Time: 01/29/24  3:26 AM  Result Value Ref Range   Sodium 136 135 - 145 mmol/L   Potassium 4.1 3.5 - 5.1 mmol/L   Chloride 109 98 - 111 mmol/L   CO2 16 (L) 22 - 32 mmol/L   Glucose, Bld 82 70 - 99 mg/dL   BUN 31 (H) 8 - 23 mg/dL   Creatinine, Ser 8.64 (H) 0.44 - 1.00 mg/dL   Calcium  8.2 (L) 8.9 - 10.3 mg/dL   Total Protein 5.6 (L) 6.5 - 8.1 g/dL   Albumin  2.7 (L) 3.5 - 5.0 g/dL   AST 18 15 - 41 U/L   ALT 11 0 - 44 U/L  Alkaline Phosphatase 84 38 - 126 U/L   Total Bilirubin 0.7 0.0 - 1.2 mg/dL   GFR, Estimated 42 (L) >60 mL/min   Anion gap 11 5 - 15  CBC   Collection Time: 01/29/24  3:26 AM  Result Value Ref Range   WBC 28.6 (H) 4.0 - 10.5 K/uL   RBC 5.82 (H) 3.87 - 5.11 MIL/uL   Hemoglobin 11.7 (L) 12.0 - 15.0 g/dL   HCT 57.6 63.9 - 53.9 %   MCV 72.7 (L) 80.0 - 100.0 fL   MCH 20.1 (L) 26.0 - 34.0 pg   MCHC 27.7 (L) 30.0 - 36.0 g/dL   RDW 77.4 (H) 88.4 - 84.4 %   Platelets 333 150 - 400 K/uL   nRBC 0.5 (H) 0.0 - 0.2 %  Magnesium    Collection Time: 01/29/24  3:26 AM  Result Value Ref Range   Magnesium  1.6 (L) 1.7 - 2.4 mg/dL  Phosphorus   Collection Time: 01/29/24  3:26 AM  Result Value Ref Range   Phosphorus 4.5 2.5 - 4.6 mg/dL  Rapid urine drug screen (hospital performed)   Collection Time: 01/29/24  8:10 AM  Result Value Ref Range   Opiates POSITIVE (A) NONE DETECTED   Cocaine NONE DETECTED NONE DETECTED   Benzodiazepines NONE DETECTED NONE DETECTED   Amphetamines NONE DETECTED NONE DETECTED   Tetrahydrocannabinol NONE DETECTED NONE DETECTED   Barbiturates NONE DETECTED NONE DETECTED  Cortisol-am, blood   Collection Time: 01/29/24  8:25 AM  Result Value Ref Range   Cortisol - AM 17.0 6.7 - 22.6 ug/dL  MRSA Next Gen by PCR, Nasal   Collection Time: 01/29/24 12:08 PM   Specimen: Nasal Mucosa; Nasal Swab  Result Value Ref Range   MRSA by PCR Next Gen NOT DETECTED NOT  DETECTED  CBC   Collection Time: 01/30/24  4:43 AM  Result Value Ref Range   WBC 28.7 (H) 4.0 - 10.5 K/uL   RBC 5.68 (H) 3.87 - 5.11 MIL/uL   Hemoglobin 11.3 (L) 12.0 - 15.0 g/dL   HCT 59.4 63.9 - 53.9 %   MCV 71.3 (L) 80.0 - 100.0 fL   MCH 19.9 (L) 26.0 - 34.0 pg   MCHC 27.9 (L) 30.0 - 36.0 g/dL   RDW 77.7 (H) 88.4 - 84.4 %   Platelets 329 150 - 400 K/uL   nRBC 0.4 (H) 0.0 - 0.2 %  Basic metabolic panel   Collection Time: 01/30/24  4:43 AM  Result Value Ref Range   Sodium 135 135 - 145 mmol/L   Potassium 3.8 3.5 - 5.1 mmol/L   Chloride 109 98 - 111 mmol/L   CO2 16 (L) 22 - 32 mmol/L   Glucose, Bld 108 (H) 70 - 99 mg/dL   BUN 26 (H) 8 - 23 mg/dL   Creatinine, Ser 8.84 (H) 0.44 - 1.00 mg/dL   Calcium  7.9 (L) 8.9 - 10.3 mg/dL   GFR, Estimated 51 (L) >60 mL/min   Anion gap 10 5 - 15  ECHOCARDIOGRAM COMPLETE   Collection Time: 01/30/24  8:05 AM  Result Value Ref Range   Weight 1,696.66 oz   Height 65 in   BP 86/58 mmHg   AR max vel 2.60 cm2   AV Area VTI 2.26 cm2   AV Mean grad 5.0 mmHg   AV Peak grad 7.6 mmHg   Ao pk vel 1.38 m/s   AV Area mean vel 2.06 cm2   MV VTI 1.50 cm2   Area-P 1/2 2.62  cm2   S' Lateral 2.50 cm   Est EF 60 - 65%   CBC   Collection Time: 01/31/24  3:53 AM  Result Value Ref Range   WBC 28.4 (H) 4.0 - 10.5 K/uL   RBC 5.77 (H) 3.87 - 5.11 MIL/uL   Hemoglobin 11.3 (L) 12.0 - 15.0 g/dL   HCT 58.8 63.9 - 53.9 %   MCV 71.2 (L) 80.0 - 100.0 fL   MCH 19.6 (L) 26.0 - 34.0 pg   MCHC 27.5 (L) 30.0 - 36.0 g/dL   RDW 77.4 (H) 88.4 - 84.4 %   Platelets 322 150 - 400 K/uL   nRBC 0.3 (H) 0.0 - 0.2 %  Basic metabolic panel   Collection Time: 01/31/24  3:53 AM  Result Value Ref Range   Sodium 137 135 - 145 mmol/L   Potassium 3.9 3.5 - 5.1 mmol/L   Chloride 107 98 - 111 mmol/L   CO2 22 22 - 32 mmol/L   Glucose, Bld 100 (H) 70 - 99 mg/dL   BUN 22 8 - 23 mg/dL   Creatinine, Ser 8.84 (H) 0.44 - 1.00 mg/dL   Calcium  7.6 (L) 8.9 - 10.3 mg/dL   GFR,  Estimated 51 (L) >60 mL/min   Anion gap 8 5 - 15  Lactic acid, plasma   Collection Time: 01/31/24  8:44 AM  Result Value Ref Range   Lactic Acid, Venous 0.7 0.5 - 1.9 mmol/L  Renal function panel   Collection Time: 02/01/24  4:33 AM  Result Value Ref Range   Sodium 137 135 - 145 mmol/L   Potassium 3.7 3.5 - 5.1 mmol/L   Chloride 104 98 - 111 mmol/L   CO2 24 22 - 32 mmol/L   Glucose, Bld 82 70 - 99 mg/dL   BUN 27 (H) 8 - 23 mg/dL   Creatinine, Ser 8.74 (H) 0.44 - 1.00 mg/dL   Calcium  7.5 (L) 8.9 - 10.3 mg/dL   Phosphorus 3.9 2.5 - 4.6 mg/dL   Albumin  2.3 (L) 3.5 - 5.0 g/dL   GFR, Estimated 46 (L) >60 mL/min   Anion gap 9 5 - 15  Renal function panel   Collection Time: 02/02/24  4:27 AM  Result Value Ref Range   Sodium 137 135 - 145 mmol/L   Potassium 3.8 3.5 - 5.1 mmol/L   Chloride 104 98 - 111 mmol/L   CO2 23 22 - 32 mmol/L   Glucose, Bld 81 70 - 99 mg/dL   BUN 26 (H) 8 - 23 mg/dL   Creatinine, Ser 8.75 (H) 0.44 - 1.00 mg/dL   Calcium  7.9 (L) 8.9 - 10.3 mg/dL   Phosphorus 3.3 2.5 - 4.6 mg/dL   Albumin  2.5 (L) 3.5 - 5.0 g/dL   GFR, Estimated 46 (L) >60 mL/min   Anion gap 10 5 - 15   *Note: Due to a large number of results and/or encounters for the requested time period, some results have not been displayed. A complete set of results can be found in Results Review.       12/01/2023   10:52 AM 09/14/2023   10:47 AM 06/23/2023   11:17 AM 02/16/2023   10:14 AM 02/09/2023    2:09 PM  Depression screen PHQ 2/9  Decreased Interest 0 0 0 0 0  Down, Depressed, Hopeless 0 0 0 0 0  PHQ - 2 Score 0 0 0 0 0  Altered sleeping  0 0 0   Tired, decreased energy  0 0 0  Change in appetite  0 0 0   Feeling bad or failure about yourself   0 0 0   Trouble concentrating  0 0 0   Moving slowly or fidgety/restless  0 0 0   Suicidal thoughts  0 0 0   PHQ-9 Score  0 0 0   Difficult doing work/chores  Not difficult at all Not difficult at all Not difficult at all        09/14/2023    10:48 AM 06/23/2023   11:17 AM 02/16/2023   10:14 AM 12/25/2022   12:02 PM  GAD 7 : Generalized Anxiety Score  Nervous, Anxious, on Edge 0 0 0 0  Control/stop worrying 0 0 0 0  Worry too much - different things 0 0 0 0  Trouble relaxing 0 0 0 0  Restless 0 0 0 0  Easily annoyed or irritable 0 0 0 0  Afraid - awful might happen 0 0 0 0  Total GAD 7 Score 0 0 0 0  Anxiety Difficulty Somewhat difficult Not difficult at all Not difficult at all Not difficult at all   Pertinent labs & imaging results that were available during my care of the patient were reviewed by me and considered in my medical decision making.  Assessment & Plan:  [name] was seen today for medical management of chronic issues.  Diagnoses and all orders for this visit: 1. Hypoxia (Primary) Patient O2 dropped to 87% while sitting on room air. Referral placed to pulmonology for spirometry evaluation. Will continue home o2.  - Ambulatory referral to Pulmonology - For home use only DME oxygen  2. Amputation of left little finger Reviewed notes from ID 06/13/24, Debby Ronny CROME, PA-C  Reviewed notes from Surgery 06/06/24 Thornell Carliss DASEN, MD   Continue to follow with surgery.   3. History of CVA (cerebrovascular accident) Reviewed notes from 06/07/24 Gearldine Warren LABOR, FNP   Continue secondary prevention.   Follow up with vascular as planned.   Continue all other maintenance medications.  Follow up plan: Return in about 3 months (around 09/14/2024) for Chronic Condition Follow up.   Continue healthy lifestyle choices, including diet (rich in fruits, vegetables, and lean proteins, and low in salt and simple carbohydrates) and exercise (at least 30 minutes of moderate physical activity daily).  Written and verbal instructions provided   The above assessment and management plan was discussed with the patient. The patient verbalized understanding of and has agreed to the management plan. Patient is aware to call the  clinic if they develop any new symptoms or if symptoms persist or worsen. Patient is aware when to return to the clinic for a follow-up visit. Patient educated on when it is appropriate to go to the emergency department.   Marry Kins, DNP-FNP [facility] [address] [place], [place] [address] [phone]

## 2024-06-27 ENCOUNTER — Ambulatory Visit: Payer: Self-pay

## 2024-06-27 NOTE — Telephone Encounter (Signed)
 I have refaxed the order. Contacted patients daughter per signed dpr and informed.

## 2024-06-27 NOTE — Telephone Encounter (Signed)
 FYI Only or Action Required?: Action required by provider: medication refill request.  Patient was last seen in primary care on 06/14/2024 by Cathlene Marry Lenis, FNP.  Called Nurse Triage reporting Shortness of Breath.  Symptoms began several days ago.  Interventions attempted: Nothing.  Symptoms are: stable.  Triage Disposition: Call PCP Now  Patient/caregiver understands and will follow disposition?: Yes   Copied from CRM [id number]. Topic: Clinical - Red Word Triage >> Jun 27, 2024  4:42 PM Brittney F wrote: Red Word that prompted transfer to Nurse Triage:   Patient has had a saturation of 87 or below Reason for Disposition  [1] Caller has URGENT question AND [2] triager unable to answer question  Answer Assessment - Initial Assessment Questions Patient's daughter Rodgers, on [place], called and says the last time her mom was in the office, Marry was supposed to send over an order for oxygen before she left and they haven't heard from anyone. The oxygen tank they have was prescribed when she left the hospital. Three days ago it stopped filling the smaller tank and the patient has been without oxygen. It's only used when her oxygen level is low. She says that when it's checked and low, they put oxygen on her. She says her SOB is not any worse than normal and all she's wanting is for someone to send a Rx for oxygen. Advised I will send this to the provider and someone will call with any recommendations.   Noted a DME order in the last OV note that has not been released.  Send Rx for Oxygen to: [facility] Fax: [phone]   1. MAIN CONCERN OR SYMPTOM: What's your main concern? (e.g., low oxygen level, breathing difficulty) What question do you have?     Low oxygen level 87 or below, needing a Rx for oxygen 2. ONSET: When did the low level start?      3 days ago when the tank stopped filling 3. OXYGEN THERAPY:    - Do you currently use home oxygen?    - If Yes, ask:  What is your oxygen source? (e.g., O2 tank, O2 concentrator).    - If Yes, ask: How do you get the oxygen? (e.g., nasal prongs, face mask).    - If Yes, ask: How much oxygen are you supposed to use? (e.g., 1-2 L [name])     Unknown to how much patient uses, Nasal cannula 4.  OXYGEN EQUIPMENT:  Are you having any trouble with your oxygen equipment?  (e.g., cannula, mask, tubing, tank, concentrator)     Yes 5. OXYGEN SATURATION MONITOR:     - Do you use an oxygen saturation monitor (pulse oximeter) at home?    - If Yes, ask: Where do you place the probe? (e.g., fingertip, ear lobe)     Finger  6. OXYGEN LEVEL: What is your reading (oxygen level) today? What is your usual oxygen saturation reading? (e.g., 95%)     87 or below, that's when she puts oxygen on  7.  BREATHING DIFFICULTY: Are you having any difficulty breathing? If Yes, ask: How bad is it?  (e.g., none, mild, moderate, severe)    - MILD: No SOB at rest, mild SOB with walking, speaks normally in sentences, able to lie down, no retractions, pulse < 100.    - MODERATE: SOB at rest, SOB with minimal exertion and prefers to sit, cannot lie down flat, speaks in phrases, mild retractions, audible wheezing, pulse 100-120.    - SEVERE:  Very SOB at rest, speaks in single words, struggling to breathe, sitting hunched forward, retractions, pulse > 120      Moderate all the time, nothing new today 9. OTHER SYMPTOMS: Do you have any other symptoms? (e.g., fever, change in sputum)     No  Protocols used: Oxygen Monitoring and Hypoxia-A-AH

## 2024-06-28 ENCOUNTER — Telehealth: Payer: Self-pay | Admitting: Family Medicine

## 2024-06-28 ENCOUNTER — Telehealth: Payer: Self-pay

## 2024-06-28 NOTE — Telephone Encounter (Unsigned)
 Copied from CRM [id number]. Topic: Clinical - Order For Equipment >> Jun 27, 2024  4:27 PM Zane F wrote: Reason for CRM:   Patient daughter, Rodgers, in reference to discussing the matter of the patient's oxygen machine which is no longer working and requesting a new order be placed.   [facility] has not received the patient's new prescription for the oxygen machine to assist.   Patient oxygen has been 87 and below for the past week.   Specialist contacted [facility] for further assistance per patient's daughter request.   Callback Number: [phone]

## 2024-06-28 NOTE — Telephone Encounter (Signed)
 Daughter Elenor Daring rc about this message. Please call back today. The fax number is [phone] for [facility].

## 2024-06-28 NOTE — Telephone Encounter (Signed)
 Daughter called back and said oxygen was sent to wrong pharmacy. It needs to go to [facility]

## 2024-06-28 NOTE — Telephone Encounter (Signed)
 Copied from CRM [id number]. Topic: General - Other >> Jun 28, 2024  2:26 PM Wess RAMAN wrote: Reason for CRM: Rosina from [facility] with [facility] stated the patient is already on oxygen and need clarification on what provider needs for patient  Callback #: [phone]

## 2024-06-29 NOTE — Telephone Encounter (Signed)
 LMOVM RTC about pt's oxygen if there was still something needed to call me back on my direct line [phone]

## 2024-06-29 NOTE — Telephone Encounter (Signed)
 TC to [facility] said this was taken care of yesterday by [place].

## 2024-07-22 ENCOUNTER — Other Ambulatory Visit: Payer: Self-pay | Admitting: Nurse Practitioner

## 2024-07-27 ENCOUNTER — Encounter: Payer: Self-pay | Admitting: Cardiology

## 2024-08-02 ENCOUNTER — Other Ambulatory Visit: Payer: Self-pay

## 2024-08-02 DIAGNOSIS — D45 Polycythemia vera: Secondary | ICD-10-CM

## 2024-08-02 DIAGNOSIS — D500 Iron deficiency anemia secondary to blood loss (chronic): Secondary | ICD-10-CM

## 2024-08-03 ENCOUNTER — Inpatient Hospital Stay: Attending: Hematology

## 2024-08-03 ENCOUNTER — Encounter: Payer: Self-pay | Admitting: Hematology

## 2024-08-03 ENCOUNTER — Inpatient Hospital Stay (HOSPITAL_BASED_OUTPATIENT_CLINIC_OR_DEPARTMENT_OTHER): Admitting: Hematology

## 2024-08-03 VITALS — BP 148/68 | HR 76 | Temp 98.2°F | Resp 16 | Ht 65.0 in | Wt 98.4 lb

## 2024-08-03 DIAGNOSIS — D75839 Thrombocytosis, unspecified: Secondary | ICD-10-CM | POA: Diagnosis not present

## 2024-08-03 DIAGNOSIS — Z79899 Other long term (current) drug therapy: Secondary | ICD-10-CM | POA: Diagnosis not present

## 2024-08-03 DIAGNOSIS — D45 Polycythemia vera: Secondary | ICD-10-CM

## 2024-08-03 DIAGNOSIS — Z7982 Long term (current) use of aspirin: Secondary | ICD-10-CM | POA: Insufficient documentation

## 2024-08-03 DIAGNOSIS — D649 Anemia, unspecified: Secondary | ICD-10-CM | POA: Insufficient documentation

## 2024-08-03 DIAGNOSIS — D500 Iron deficiency anemia secondary to blood loss (chronic): Secondary | ICD-10-CM

## 2024-08-03 LAB — CMP (CANCER CENTER ONLY)
ALT: 15 U/L (ref 0–44)
AST: 24 U/L (ref 15–41)
Albumin: 3.5 g/dL (ref 3.5–5.0)
Alkaline Phosphatase: 100 U/L (ref 38–126)
Anion gap: 7 (ref 5–15)
BUN: 48 mg/dL — ABNORMAL HIGH (ref 8–23)
CO2: 21 mmol/L — ABNORMAL LOW (ref 22–32)
Calcium: 8.2 mg/dL — ABNORMAL LOW (ref 8.9–10.3)
Chloride: 109 mmol/L (ref 98–111)
Creatinine: 1.44 mg/dL — ABNORMAL HIGH (ref 0.44–1.00)
GFR, Estimated: 39 mL/min — ABNORMAL LOW (ref >60)
Glucose, Bld: 79 mg/dL (ref 70–99)
Potassium: 5 mmol/L (ref 3.5–5.1)
Sodium: 137 mmol/L (ref 135–145)
Total Bilirubin: 0.4 mg/dL (ref 0.0–1.2)
Total Protein: 6.3 g/dL — ABNORMAL LOW (ref 6.5–8.1)

## 2024-08-03 LAB — CBC WITH DIFFERENTIAL (CANCER CENTER ONLY)
Abs Immature Granulocytes: 1.17 K/uL — ABNORMAL HIGH (ref 0.00–0.07)
Basophils Absolute: 0.4 K/uL — ABNORMAL HIGH (ref 0.0–0.1)
Basophils Relative: 1 %
Eosinophils Absolute: 0.8 K/uL — ABNORMAL HIGH (ref 0.0–0.5)
Eosinophils Relative: 2 %
HCT: 38.3 % (ref 36.0–46.0)
Hemoglobin: 10.9 g/dL — ABNORMAL LOW (ref 12.0–15.0)
Immature Granulocytes: 4 %
Lymphocytes Relative: 5 %
Lymphs Abs: 1.4 K/uL (ref 0.7–4.0)
MCH: 19 pg — ABNORMAL LOW (ref 26.0–34.0)
MCHC: 28.5 g/dL — ABNORMAL LOW (ref 30.0–36.0)
MCV: 66.6 fL — ABNORMAL LOW (ref 80.0–100.0)
Monocytes Absolute: 0.5 K/uL (ref 0.1–1.0)
Monocytes Relative: 2 %
Neutro Abs: 27.5 K/uL — ABNORMAL HIGH (ref 1.7–7.7)
Neutrophils Relative %: 86 %
Platelet Count: 476 K/uL — ABNORMAL HIGH (ref 150–400)
RBC: 5.75 MIL/uL — ABNORMAL HIGH (ref 3.87–5.11)
RDW: 25.1 % — ABNORMAL HIGH (ref 11.5–15.5)
WBC Count: 31.7 K/uL — ABNORMAL HIGH (ref 4.0–10.5)
nRBC: 0.2 % (ref 0.0–0.2)
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
## 2024-08-08 ENCOUNTER — Other Ambulatory Visit: Payer: Self-pay | Admitting: *Deleted

## 2024-08-08 DIAGNOSIS — I10 Essential (primary) hypertension: Secondary | ICD-10-CM

## 2024-08-08 MED ORDER — LISINOPRIL 10 MG PO TABS: 10.0000 mg | ORAL_TABLET | Freq: Every day | ORAL | 0 refills | Status: DC

## 2024-08-23 ENCOUNTER — Other Ambulatory Visit: Payer: Self-pay

## 2024-08-23 DIAGNOSIS — J209 Acute bronchitis, unspecified: Secondary | ICD-10-CM | POA: Insufficient documentation

## 2024-08-23 DIAGNOSIS — Z7982 Long term (current) use of aspirin: Secondary | ICD-10-CM | POA: Insufficient documentation

## 2024-08-23 DIAGNOSIS — Z7901 Long term (current) use of anticoagulants: Secondary | ICD-10-CM | POA: Insufficient documentation

## 2024-08-23 DIAGNOSIS — N3001 Acute cystitis with hematuria: Secondary | ICD-10-CM | POA: Diagnosis not present

## 2024-08-23 DIAGNOSIS — R059 Cough, unspecified: Secondary | ICD-10-CM | POA: Diagnosis present

## 2024-08-24 ENCOUNTER — Emergency Department (HOSPITAL_COMMUNITY)

## 2024-08-24 ENCOUNTER — Encounter (HOSPITAL_COMMUNITY): Payer: Self-pay

## 2024-08-24 ENCOUNTER — Emergency Department (HOSPITAL_COMMUNITY)
Admission: EM | Admit: 2024-08-24 | Discharge: 2024-08-24 | Disposition: A | Discharge status: Home / Self Care | Attending: Emergency Medicine | Admitting: Emergency Medicine

## 2024-08-24 ENCOUNTER — Other Ambulatory Visit: Payer: Self-pay

## 2024-08-24 DIAGNOSIS — N3001 Acute cystitis with hematuria: Secondary | ICD-10-CM

## 2024-08-24 DIAGNOSIS — J209 Acute bronchitis, unspecified: Secondary | ICD-10-CM

## 2024-08-24 LAB — RESP PANEL BY RT-PCR (RSV, FLU A&B, COVID)  RVPGX2
Influenza A by PCR: NEGATIVE
Influenza B by PCR: NEGATIVE
Resp Syncytial Virus by PCR: NEGATIVE
SARS Coronavirus 2 by RT PCR: NEGATIVE

## 2024-08-24 LAB — URINALYSIS, ROUTINE W REFLEX MICROSCOPIC
Bilirubin Urine: NEGATIVE
Glucose, UA: 100 mg/dL — AB
Ketones, ur: NEGATIVE mg/dL
Nitrite: NEGATIVE
Protein, ur: >300 mg/dL — AB
Specific Gravity, Urine: 1.015 (ref 1.005–1.030)
pH: 6 (ref 5.0–8.0)

## 2024-08-24 LAB — URINALYSIS, MICROSCOPIC (REFLEX)
RBC / HPF: >50 RBC/hpf (ref 0–5)
WBC, UA: >50 WBC/hpf (ref 0–5)

## 2024-08-24 MED ORDER — ALBUTEROL SULFATE HFA 108 (90 BASE) MCG/ACT IN AERS
2.0000 | INHALATION_SPRAY | RESPIRATORY_TRACT | Status: DC | PRN
  Filled 2024-08-24: qty 6.7

## 2024-08-24 MED ORDER — CEPHALEXIN 500 MG PO CAPS: 500.0000 mg | ORAL_CAPSULE | Freq: Two times a day (BID) | ORAL | 0 refills | Status: DC

## 2024-08-24 NOTE — ED Provider Notes (Signed)
 [place] EMERGENCY DEPARTMENT AT [facility]  Provider Note  CSN: [id number] Arrival date & time: 08/23/24 2346  History Chief Complaint  Patient presents with   Cough    [name] is a 73 y.o. female brought by granddaughter for evaluation of cough persistent for the last 2 weeks, initially had green sputum, then yellow and now clear. She has not had a fever. Some SOB when coughing. Child in the household recently diagnosis with PNA and they are concerned she may have it too. She also reports dysuria for the last several days.    Home Medications Prior to Admission medications   Medication Sig Start Date End Date Taking? Authorizing Provider  cephALEXin  (KEFLEX ) 500 MG capsule Take 1 capsule (500 mg total) by mouth 2 (two) times daily for 7 days. 08/24/24 08/31/24 Yes Roselyn Carlin NOVAK, MD  alendronate  (FOSAMAX ) 70 MG tablet TAKE ONE TABLET ONCE WEEKLY 07/24/24   Dettinger, Fonda LABOR, MD  amLODipine  (NORVASC ) 10 MG tablet TAKE ONE TABLET ONCE DAILY 07/24/24   Dettinger, Fonda LABOR, MD  apixaban  (ELIQUIS ) 2.5 MG TABS tablet Take 1 tablet (2.5 mg total) by mouth 2 (two) times daily. 05/03/24   [facility] Morton Sebastian Pool, NP  aspirin  EC 325 MG tablet Take 325 mg by mouth daily.    [provider]  Calcium  Carbonate (CALTRATE 600 PO) Take by mouth. 04/04/24   [provider]  colchicine  0.6 MG tablet TAKE ONE TABLET ONCE DAILY 07/24/24   Dettinger, Fonda LABOR, MD  diclofenac  Sodium (VOLTAREN ) 1 % GEL Apply 2 g topically 4 (four) times daily as needed (pain). 02/02/24   Ricky Fines, MD  escitalopram  (LEXAPRO ) 20 MG tablet TAKE ONE TABLET ONCE DAILY 07/24/24   Dettinger, Fonda LABOR, MD  ezetimibe  (ZETIA ) 10 MG tablet Take 1 tablet (10 mg total) by mouth daily. 05/03/24   [facility] Morton Sebastian Pool, NP  FARXIGA  5 MG TABS tablet Take 1 tablet (5 mg total) by mouth daily. 05/03/24   [facility] Morton Sebastian Pool, NP  fenofibrate  (TRICOR ) 145 MG tablet Take 1 tablet (145 mg total) by  mouth daily. Patient taking differently: Take 134 mg by mouth daily. 01/13/24   West, Katlyn D, NP  flecainide  (TAMBOCOR ) 50 MG tablet Take 1 tablet (50 mg total) by mouth 2 (two) times daily. 05/03/24   [facility] Morton Sebastian Pool, NP  ipratropium-albuterol  (DUONEB) 0.5-2.5 (3) MG/3ML SOLN Take 3 mLs by nebulization.    [provider]  lisinopril  (ZESTRIL ) 10 MG tablet Take 1 tablet (10 mg total) by mouth daily. 08/08/24   Dettinger, Fonda LABOR, MD  methocarbamol (ROBAXIN) 500 MG tablet Take by mouth. 02/14/24   [provider]  ondansetron  (ZOFRAN ) 4 MG tablet Take 4 mg by mouth.    [provider]  pantoprazole  (PROTONIX ) 40 MG tablet TAKE ONE TABLET ONCE DAILY 07/24/24   Dettinger, Joshua A, MD  sulfamethoxazole-trimethoprim (BACTRIM DS) 800-160 MG tablet Take 1 tablet by mouth 2 (two) times daily. 06/06/24   [provider]  zinc  oxide 20 % ointment Apply 1 Application topically as needed for irritation.    [provider]     Allergies    Atenolol, Demerol   [meperidine  hcl], Demerol  [meperidine ], Gabapentin , Inderal [propranolol], Prednisone, Statins, Warfarin and related, Crestor [rosuvastatin], Docosahexaenoic acid (dha) (fish), Lactose intolerance (gi), Lipitor [atorvastatin ], Lovaza [omega-3-acid ethyl esters (fish)], Penicillins, Pravastatin , Simvastatin , Trilipix [choline fenofibrate ], Warfarin, Hydralazine , Effexor [venlafaxine hcl], Nexium [esomeprazole magnesium ], Percocet [oxycodone -acetaminophen ], and Plavix  [clopidogrel  bisulfate]  Review of Systems   Review of Systems Please see HPI for pertinent positives and negatives  Physical Exam BP (!) 150/80   Pulse 99   Temp 97.9 F (36.6 C) (Oral)   Resp 18   Ht 5' 5 (1.651 m)   Wt 44.6 kg   SpO2 94%   BMI 16.37 kg/m   Physical Exam Vitals and nursing note reviewed.  Constitutional:      Appearance: Normal appearance.  HENT:     Head: Normocephalic and atraumatic.     Nose: Nose  normal.     Mouth/Throat:     Mouth: Mucous membranes are moist.  Eyes:     Extraocular Movements: Extraocular movements intact.     Conjunctiva/sclera: Conjunctivae normal.  Cardiovascular:     Rate and Rhythm: Normal rate.  Pulmonary:     Effort: Pulmonary effort is normal.     Breath sounds: Wheezing and rhonchi present. No rales.  Abdominal:     General: Abdomen is flat.     Palpations: Abdomen is soft.     Tenderness: There is no abdominal tenderness.  Musculoskeletal:        General: No swelling. Normal range of motion.     Cervical back: Neck supple.     Comments: S/p R BKA  Skin:    General: Skin is warm and dry.  Neurological:     Mental Status: She is alert and oriented to person, place, and time. Mental status is at baseline.  Psychiatric:        Mood and Affect: Mood normal.     ED Results / Procedures / Treatments   EKG None  Procedures Procedures  Medications Ordered in the ED Medications  albuterol  (VENTOLIN  HFA) 108 (90 Base) MCG/ACT inhaler 2 puff (has no administration in time range)    Initial Impression and Plan  Patient here with cough, has some wheezing and rhonchi on exam. I personally viewed the images from radiology studies and agree with radiologist interpretation: CXR is clear, likely a viral or allergic bronchitis. Will treat with inhaler. She has a negative Covid/Flu/RSV swab. UA is positive for infection, will send for culture and treat with Keflex . Otherwise she is well appearing, hemodynamically stable and ready to go home. PCP follow up, RTED for any other concerns.    ED Course       MDM Rules/Calculators/A&P Medical Decision Making Problems Addressed: Acute bronchitis, unspecified organism: acute illness or injury Acute cystitis with hematuria: acute illness or injury  Amount and/or Complexity of Data Reviewed Labs: ordered. Decision-making details documented in ED Course. Radiology: ordered and independent interpretation  performed. Decision-making details documented in ED Course.  Risk Prescription drug management.     Final Clinical Impression(s) / ED Diagnoses Final diagnoses:  Acute bronchitis, unspecified organism  Acute cystitis with hematuria    Rx / DC Orders ED Discharge Orders          Ordered    cephALEXin  (KEFLEX ) 500 MG capsule  2 times daily        08/24/24 0159             Roselyn Carlin NOVAK, MD 08/24/24 [phone]

## 2024-08-24 NOTE — ED Triage Notes (Addendum)
 Pov from home. Cc of cough. Family thinks she has pneumonia. Has been feeling unwell 2 weeks. Has not called pcp.has not had fever. Wants covid/flu test because she has a 44week old grandbaby at home.  Also says she might have a uti because it burns when she urinates.

## 2024-08-25 LAB — URINE CULTURE: Culture: NO GROWTH

## 2024-08-31 ENCOUNTER — Other Ambulatory Visit: Payer: Self-pay

## 2024-08-31 ENCOUNTER — Encounter: Payer: Self-pay | Admitting: Family Medicine

## 2024-08-31 ENCOUNTER — Inpatient Hospital Stay (HOSPITAL_COMMUNITY)
Admission: EM | Admit: 2024-08-31 | Discharge: 2024-09-03 | DRG: 690 | Disposition: A | Discharge status: Home / Self Care | Attending: Internal Medicine | Admitting: Internal Medicine

## 2024-08-31 ENCOUNTER — Encounter (HOSPITAL_COMMUNITY): Payer: Self-pay

## 2024-08-31 DIAGNOSIS — I130 Hypertensive heart and chronic kidney disease with heart failure and stage 1 through stage 4 chronic kidney disease, or unspecified chronic kidney disease: Secondary | ICD-10-CM | POA: Diagnosis present

## 2024-08-31 DIAGNOSIS — E785 Hyperlipidemia, unspecified: Secondary | ICD-10-CM | POA: Diagnosis present

## 2024-08-31 DIAGNOSIS — R310 Gross hematuria: Secondary | ICD-10-CM | POA: Insufficient documentation

## 2024-08-31 DIAGNOSIS — J4489 Other specified chronic obstructive pulmonary disease: Secondary | ICD-10-CM | POA: Diagnosis present

## 2024-08-31 DIAGNOSIS — E441 Mild protein-calorie malnutrition: Secondary | ICD-10-CM | POA: Diagnosis present

## 2024-08-31 DIAGNOSIS — I69320 Aphasia following cerebral infarction: Secondary | ICD-10-CM

## 2024-08-31 DIAGNOSIS — D75839 Thrombocytosis, unspecified: Secondary | ICD-10-CM | POA: Diagnosis present

## 2024-08-31 DIAGNOSIS — I69351 Hemiplegia and hemiparesis following cerebral infarction affecting right dominant side: Secondary | ICD-10-CM

## 2024-08-31 DIAGNOSIS — Z800 Family history of malignant neoplasm of digestive organs: Secondary | ICD-10-CM

## 2024-08-31 DIAGNOSIS — Z822 Family history of deafness and hearing loss: Secondary | ICD-10-CM

## 2024-08-31 DIAGNOSIS — Z7983 Long term (current) use of bisphosphonates: Secondary | ICD-10-CM

## 2024-08-31 DIAGNOSIS — Z89611 Acquired absence of right leg above knee: Secondary | ICD-10-CM

## 2024-08-31 DIAGNOSIS — D45 Polycythemia vera: Secondary | ICD-10-CM | POA: Diagnosis present

## 2024-08-31 DIAGNOSIS — Z823 Family history of stroke: Secondary | ICD-10-CM

## 2024-08-31 DIAGNOSIS — R319 Hematuria, unspecified: Principal | ICD-10-CM | POA: Diagnosis present

## 2024-08-31 DIAGNOSIS — M810 Age-related osteoporosis without current pathological fracture: Secondary | ICD-10-CM | POA: Diagnosis present

## 2024-08-31 DIAGNOSIS — Z821 Family history of blindness and visual loss: Secondary | ICD-10-CM

## 2024-08-31 DIAGNOSIS — I2510 Atherosclerotic heart disease of native coronary artery without angina pectoris: Secondary | ICD-10-CM | POA: Diagnosis present

## 2024-08-31 DIAGNOSIS — J9611 Chronic respiratory failure with hypoxia: Secondary | ICD-10-CM | POA: Diagnosis present

## 2024-08-31 DIAGNOSIS — Z7901 Long term (current) use of anticoagulants: Secondary | ICD-10-CM

## 2024-08-31 DIAGNOSIS — Z8261 Family history of arthritis: Secondary | ICD-10-CM

## 2024-08-31 DIAGNOSIS — Z880 Allergy status to penicillin: Secondary | ICD-10-CM

## 2024-08-31 DIAGNOSIS — I252 Old myocardial infarction: Secondary | ICD-10-CM

## 2024-08-31 DIAGNOSIS — N1831 Chronic kidney disease, stage 3a: Secondary | ICD-10-CM | POA: Diagnosis present

## 2024-08-31 DIAGNOSIS — Z66 Do not resuscitate: Secondary | ICD-10-CM | POA: Diagnosis present

## 2024-08-31 DIAGNOSIS — N3001 Acute cystitis with hematuria: Principal | ICD-10-CM | POA: Diagnosis present

## 2024-08-31 DIAGNOSIS — Z79899 Other long term (current) drug therapy: Secondary | ICD-10-CM

## 2024-08-31 DIAGNOSIS — K5900 Constipation, unspecified: Secondary | ICD-10-CM | POA: Diagnosis present

## 2024-08-31 DIAGNOSIS — E860 Dehydration: Secondary | ICD-10-CM | POA: Diagnosis present

## 2024-08-31 DIAGNOSIS — Z9842 Cataract extraction status, left eye: Secondary | ICD-10-CM

## 2024-08-31 DIAGNOSIS — Z811 Family history of alcohol abuse and dependence: Secondary | ICD-10-CM

## 2024-08-31 DIAGNOSIS — N390 Urinary tract infection, site not specified: Secondary | ICD-10-CM | POA: Diagnosis not present

## 2024-08-31 DIAGNOSIS — E875 Hyperkalemia: Secondary | ICD-10-CM | POA: Diagnosis present

## 2024-08-31 DIAGNOSIS — E739 Lactose intolerance, unspecified: Secondary | ICD-10-CM | POA: Diagnosis present

## 2024-08-31 DIAGNOSIS — Z885 Allergy status to narcotic agent status: Secondary | ICD-10-CM

## 2024-08-31 DIAGNOSIS — Z7982 Long term (current) use of aspirin: Secondary | ICD-10-CM

## 2024-08-31 DIAGNOSIS — Z961 Presence of intraocular lens: Secondary | ICD-10-CM | POA: Diagnosis present

## 2024-08-31 DIAGNOSIS — Z801 Family history of malignant neoplasm of trachea, bronchus and lung: Secondary | ICD-10-CM

## 2024-08-31 DIAGNOSIS — Z9981 Dependence on supplemental oxygen: Secondary | ICD-10-CM

## 2024-08-31 DIAGNOSIS — Z681 Body mass index (BMI) 19 or less, adult: Secondary | ICD-10-CM

## 2024-08-31 DIAGNOSIS — E872 Acidosis, unspecified: Secondary | ICD-10-CM | POA: Diagnosis present

## 2024-08-31 DIAGNOSIS — I480 Paroxysmal atrial fibrillation: Secondary | ICD-10-CM | POA: Diagnosis present

## 2024-08-31 DIAGNOSIS — Z9841 Cataract extraction status, right eye: Secondary | ICD-10-CM

## 2024-08-31 DIAGNOSIS — N179 Acute kidney failure, unspecified: Secondary | ICD-10-CM | POA: Diagnosis present

## 2024-08-31 DIAGNOSIS — Z888 Allergy status to other drugs, medicaments and biological substances status: Secondary | ICD-10-CM

## 2024-08-31 DIAGNOSIS — Z8249 Family history of ischemic heart disease and other diseases of the circulatory system: Secondary | ICD-10-CM

## 2024-08-31 DIAGNOSIS — Z90710 Acquired absence of both cervix and uterus: Secondary | ICD-10-CM

## 2024-08-31 DIAGNOSIS — K219 Gastro-esophageal reflux disease without esophagitis: Secondary | ICD-10-CM | POA: Diagnosis present

## 2024-08-31 DIAGNOSIS — Z87891 Personal history of nicotine dependence: Secondary | ICD-10-CM

## 2024-08-31 DIAGNOSIS — I5032 Chronic diastolic (congestive) heart failure: Secondary | ICD-10-CM | POA: Diagnosis present

## 2024-08-31 NOTE — ED Notes (Signed)
 Pt unable to provide urine at this time

## 2024-08-31 NOTE — ED Triage Notes (Signed)
 Patient from home for blood in urine with blood clots that started today. Reports painful urination. Upon arrival ER, patient is alert and oriented, wheelchair

## 2024-08-31 NOTE — ED Provider Notes (Signed)
 [place] EMERGENCY DEPARTMENT AT [facility] Provider Note   CSN: [id number] Arrival date & time: 08/31/24  2014     History Chief Complaint  Patient presents with   Hematuria    HPI [name] is a 73 y.o. female presenting for chief complaint of vaginal bleeding. Also dx with UTI per PCP recently and on ABX but sxs only getting worse. (Keflex  BID)  Urine culture from that time reviewed and no growth to date. Patient's recorded medical, surgical, social, medication list and allergies were reviewed in the Snapshot window as part of the initial history.   Review of Systems   Review of Systems  Constitutional:  Negative for chills and fever.  HENT:  Negative for ear pain and sore throat.   Eyes:  Negative for pain and visual disturbance.  Respiratory:  Negative for cough and shortness of breath.   Cardiovascular:  Negative for chest pain and palpitations.  Gastrointestinal:  Negative for abdominal pain and vomiting.  Genitourinary:  Positive for dysuria, frequency, urgency and vaginal bleeding. Negative for hematuria.  Musculoskeletal:  Negative for arthralgias and back pain.  Skin:  Negative for color change and rash.  Neurological:  Negative for seizures and syncope.  All other systems reviewed and are negative.   Physical Exam Updated Vital Signs BP (!) 149/78   Pulse 84   Temp 98.6 F (37 C) (Oral)   Resp 18   Ht 5' 5 (1.651 m)   Wt 44.6 kg   SpO2 93%   BMI 16.37 kg/m  Physical Exam Vitals and nursing note reviewed.  Constitutional:      General: She is not in acute distress.    Appearance: She is well-developed.  HENT:     Head: Normocephalic and atraumatic.  Eyes:     Conjunctiva/sclera: Conjunctivae normal.  Cardiovascular:     Rate and Rhythm: Normal rate and regular rhythm.     Heart sounds: No murmur heard. Pulmonary:     Effort: Pulmonary effort is normal. No respiratory distress.     Breath sounds: Normal breath sounds.   Abdominal:     General: There is no distension.     Palpations: Abdomen is soft.     Tenderness: There is no abdominal tenderness. There is no right CVA tenderness or left CVA tenderness.  Musculoskeletal:        General: No swelling or tenderness. Normal range of motion.     Cervical back: Neck supple.  Skin:    General: Skin is warm and dry.  Neurological:     General: No focal deficit present.     Mental Status: She is alert and oriented to person, place, and time. Mental status is at baseline.     Cranial Nerves: No cranial nerve deficit.      ED Course/ Medical Decision Making/ A&P    Procedures Procedures   Medications Ordered in ED Medications - No data to display  Medical Decision Making:   [name] is a 73 y.o. female who presented to the ED today with *** detailed above.    {crccomplexity:27900} Complete initial physical exam performed, notably the patient  was ***.    Reviewed and confirmed nursing documentation for past medical history, family history, social history.    Initial Assessment:   With the patient's presentation of ***, most likely diagnosis is ***. Other diagnoses were considered including (but not limited to) ***. These are considered less likely due to history of present illness  and physical exam findings.   {crccopa:27899}  Initial Plan:  ***  ***Screening labs including CBC and Metabolic panel to evaluate for infectious or metabolic etiology of disease.  ***Urinalysis with reflex culture ordered to evaluate for UTI or relevant urologic/nephrologic pathology.  ***CXR to evaluate for structural/infectious intrathoracic pathology.  {crccardiactesting:32591::EKG to evaluate for cardiac pathology} Objective evaluation as below reviewed   Initial Study Results:   Laboratory  All laboratory results reviewed without evidence of clinically relevant pathology.   ***Exceptions include: ***   ***EKG EKG was reviewed independently. Rate,  rhythm, axis, intervals all examined and without medically relevant abnormality. ST segments without concerns for elevations.    Radiology:  All images reviewed independently. ***Agree with radiology report at this time.   DG Chest 2 View Result Date: 08/24/2024 CLINICAL DATA:  Thick yellow sputum. EXAM: CHEST - 2 VIEW COMPARISON:  Portable chest 02/09/2024. FINDINGS: Stable mild cardiomegaly. The mitral ring is heavily calcified. There is patchy calcification of the thoracic aorta. The mediastinum is normally outlined. No vascular congestion is seen. The lungs are clear. The sulci are sharp. There was previously interstitial edema which has cleared. Thoracic cage is intact with osteopenia and thoracic kyphosis. IMPRESSION: No evidence of acute chest disease. Stable mild cardiomegaly. Aortic atherosclerosis. Electronically Signed   By: Francis Quam M.D.   On: 08/24/2024 01:32      Consults: Case discussed with ***.   Reassessment and Plan:   ***    ***  Clinical Impression: No diagnosis found.   Data Unavailable   Final Clinical Impression(s) / ED Diagnoses Final diagnoses:  None    Rx / DC Orders ED Discharge Orders     None

## 2024-08-31 NOTE — ED Provider Notes (Incomplete)
  [place] EMERGENCY DEPARTMENT AT [facility] Provider Note   CSN: [id number] Arrival date & time: 08/31/24  2014     History Chief Complaint  Patient presents with  . Hematuria    HPI [name] is a 73 y.o. female presenting for ***.   Patient's recorded medical, surgical, social, medication list and allergies were reviewed in the Snapshot window as part of the initial history.   Review of Systems   Review of Systems  Physical Exam Updated Vital Signs BP (!) 151/77   Pulse 82   Temp 98.6 F (37 C) (Oral)   Resp 18   Ht 5' 5 (1.651 m)   Wt 44.6 kg   SpO2 94%   BMI 16.37 kg/m  Physical Exam   ED Course/ Medical Decision Making/ A&P    Procedures Procedures   Medications Ordered in ED Medications - No data to display  Medical Decision Making:   [name] is a 73 y.o. female who presented to the ED today with *** detailed above.    {crccomplexity:27900} Complete initial physical exam performed, notably the patient  was ***.    Reviewed and confirmed nursing documentation for past medical history, family history, social history.    Initial Assessment:   With the patient's presentation of ***, most likely diagnosis is ***. Other diagnoses were considered including (but not limited to) ***. These are considered less likely due to history of present illness and physical exam findings.   {crccopa:27899}  Initial Plan:  ***  ***Screening labs including CBC and Metabolic panel to evaluate for infectious or metabolic etiology of disease.  ***Urinalysis with reflex culture ordered to evaluate for UTI or relevant urologic/nephrologic pathology.  ***CXR to evaluate for structural/infectious intrathoracic pathology.  {crccardiactesting:32591::EKG to evaluate for cardiac pathology} Objective evaluation as below reviewed   Initial Study Results:   Laboratory  All laboratory results reviewed without evidence of clinically relevant pathology.    ***Exceptions include: ***   ***EKG EKG was reviewed independently. Rate, rhythm, axis, intervals all examined and without medically relevant abnormality. ST segments without concerns for elevations.    Radiology:  All images reviewed independently. ***Agree with radiology report at this time.   DG Chest 2 View Result Date: 08/24/2024 CLINICAL DATA:  Thick yellow sputum. EXAM: CHEST - 2 VIEW COMPARISON:  Portable chest 02/09/2024. FINDINGS: Stable mild cardiomegaly. The mitral ring is heavily calcified. There is patchy calcification of the thoracic aorta. The mediastinum is normally outlined. No vascular congestion is seen. The lungs are clear. The sulci are sharp. There was previously interstitial edema which has cleared. Thoracic cage is intact with osteopenia and thoracic kyphosis. IMPRESSION: No evidence of acute chest disease. Stable mild cardiomegaly. Aortic atherosclerosis. Electronically Signed   By: Francis Quam M.D.   On: 08/24/2024 01:32      Consults: Case discussed with ***.   Reassessment and Plan:   ***    ***  Clinical Impression: No diagnosis found.   Data Unavailable   Final Clinical Impression(s) / ED Diagnoses Final diagnoses:  None    Rx / DC Orders ED Discharge Orders     None

## 2024-09-01 ENCOUNTER — Emergency Department (HOSPITAL_COMMUNITY)

## 2024-09-01 DIAGNOSIS — D45 Polycythemia vera: Secondary | ICD-10-CM | POA: Diagnosis present

## 2024-09-01 DIAGNOSIS — I69320 Aphasia following cerebral infarction: Secondary | ICD-10-CM | POA: Diagnosis not present

## 2024-09-01 DIAGNOSIS — J4489 Other specified chronic obstructive pulmonary disease: Secondary | ICD-10-CM | POA: Diagnosis present

## 2024-09-01 DIAGNOSIS — N390 Urinary tract infection, site not specified: Secondary | ICD-10-CM | POA: Diagnosis present

## 2024-09-01 DIAGNOSIS — R310 Gross hematuria: Secondary | ICD-10-CM | POA: Insufficient documentation

## 2024-09-01 DIAGNOSIS — N3001 Acute cystitis with hematuria: Secondary | ICD-10-CM | POA: Diagnosis present

## 2024-09-01 DIAGNOSIS — Z89611 Acquired absence of right leg above knee: Secondary | ICD-10-CM | POA: Diagnosis not present

## 2024-09-01 DIAGNOSIS — I69351 Hemiplegia and hemiparesis following cerebral infarction affecting right dominant side: Secondary | ICD-10-CM | POA: Diagnosis not present

## 2024-09-01 DIAGNOSIS — E441 Mild protein-calorie malnutrition: Secondary | ICD-10-CM | POA: Diagnosis present

## 2024-09-01 DIAGNOSIS — E872 Acidosis, unspecified: Secondary | ICD-10-CM | POA: Diagnosis present

## 2024-09-01 DIAGNOSIS — K219 Gastro-esophageal reflux disease without esophagitis: Secondary | ICD-10-CM | POA: Diagnosis present

## 2024-09-01 DIAGNOSIS — E785 Hyperlipidemia, unspecified: Secondary | ICD-10-CM | POA: Diagnosis present

## 2024-09-01 DIAGNOSIS — R319 Hematuria, unspecified: Secondary | ICD-10-CM | POA: Diagnosis present

## 2024-09-01 DIAGNOSIS — N1831 Chronic kidney disease, stage 3a: Secondary | ICD-10-CM | POA: Diagnosis present

## 2024-09-01 DIAGNOSIS — J9611 Chronic respiratory failure with hypoxia: Secondary | ICD-10-CM | POA: Diagnosis present

## 2024-09-01 DIAGNOSIS — N179 Acute kidney failure, unspecified: Secondary | ICD-10-CM | POA: Diagnosis present

## 2024-09-01 DIAGNOSIS — Z66 Do not resuscitate: Secondary | ICD-10-CM | POA: Diagnosis present

## 2024-09-01 DIAGNOSIS — I2510 Atherosclerotic heart disease of native coronary artery without angina pectoris: Secondary | ICD-10-CM | POA: Diagnosis present

## 2024-09-01 DIAGNOSIS — E875 Hyperkalemia: Secondary | ICD-10-CM | POA: Diagnosis present

## 2024-09-01 DIAGNOSIS — I480 Paroxysmal atrial fibrillation: Secondary | ICD-10-CM | POA: Diagnosis present

## 2024-09-01 DIAGNOSIS — Z7901 Long term (current) use of anticoagulants: Secondary | ICD-10-CM | POA: Diagnosis not present

## 2024-09-01 DIAGNOSIS — I130 Hypertensive heart and chronic kidney disease with heart failure and stage 1 through stage 4 chronic kidney disease, or unspecified chronic kidney disease: Secondary | ICD-10-CM | POA: Diagnosis present

## 2024-09-01 DIAGNOSIS — I5032 Chronic diastolic (congestive) heart failure: Secondary | ICD-10-CM | POA: Diagnosis present

## 2024-09-01 DIAGNOSIS — Z681 Body mass index (BMI) 19 or less, adult: Secondary | ICD-10-CM | POA: Diagnosis not present

## 2024-09-01 DIAGNOSIS — I252 Old myocardial infarction: Secondary | ICD-10-CM | POA: Diagnosis not present

## 2024-09-01 DIAGNOSIS — E860 Dehydration: Secondary | ICD-10-CM | POA: Diagnosis present

## 2024-09-01 DIAGNOSIS — M810 Age-related osteoporosis without current pathological fracture: Secondary | ICD-10-CM | POA: Diagnosis present

## 2024-09-01 LAB — CBC WITH DIFFERENTIAL/PLATELET
Basophils Absolute: 0.3 K/uL — ABNORMAL HIGH (ref 0.0–0.1)
Basophils Relative: 1 %
Eosinophils Absolute: 0.9 K/uL — ABNORMAL HIGH (ref 0.0–0.5)
Eosinophils Relative: 3 %
HCT: 37.4 % (ref 36.0–46.0)
Hemoglobin: 10.3 g/dL — ABNORMAL LOW (ref 12.0–15.0)
Immature Granulocytes: 3 %
Lymphocytes Relative: 6 %
Lymphs Abs: 1.5 K/uL (ref 0.7–4.0)
MCH: 19.4 pg — ABNORMAL LOW (ref 26.0–34.0)
MCHC: 27.5 g/dL — ABNORMAL LOW (ref 30.0–36.0)
MCV: 70.3 fL — ABNORMAL LOW (ref 80.0–100.0)
Monocytes Absolute: 0.5 K/uL (ref 0.1–1.0)
Monocytes Relative: 2 %
Neutro Abs: 21.8 K/uL — ABNORMAL HIGH (ref 1.7–7.7)
Neutrophils Relative %: 85 %
Platelets: 423 K/uL — ABNORMAL HIGH (ref 150–400)
RBC: 5.32 MIL/uL — ABNORMAL HIGH (ref 3.87–5.11)
RDW: 25.3 % — ABNORMAL HIGH (ref 11.5–15.5)
WBC: 25.8 K/uL — ABNORMAL HIGH (ref 4.0–10.5)
nRBC: 0 /100{WBCs}
nRBC: 0.2 % (ref 0.0–0.2)

## 2024-09-01 LAB — URINALYSIS, ROUTINE W REFLEX MICROSCOPIC
RBC / HPF: >50 RBC/hpf (ref 0–5)
WBC, UA: >50 WBC/hpf (ref 0–5)

## 2024-09-01 LAB — COMPREHENSIVE METABOLIC PANEL WITH GFR
ALT: 33 U/L (ref 0–44)
AST: 39 U/L (ref 15–41)
Albumin: 2.9 g/dL — ABNORMAL LOW (ref 3.5–5.0)
Alkaline Phosphatase: 84 U/L (ref 38–126)
Anion gap: 11 (ref 5–15)
BUN: 65 mg/dL — ABNORMAL HIGH (ref 8–23)
CO2: 16 mmol/L — ABNORMAL LOW (ref 22–32)
Calcium: 7.4 mg/dL — ABNORMAL LOW (ref 8.9–10.3)
Chloride: 111 mmol/L (ref 98–111)
Creatinine, Ser: 1.72 mg/dL — ABNORMAL HIGH (ref 0.44–1.00)
GFR, Estimated: 31 mL/min — ABNORMAL LOW (ref >60)
Glucose, Bld: 87 mg/dL (ref 70–99)
Potassium: 5.3 mmol/L — ABNORMAL HIGH (ref 3.5–5.1)
Sodium: 138 mmol/L (ref 135–145)
Total Bilirubin: 0.7 mg/dL (ref 0.0–1.2)
Total Protein: 6.1 g/dL — ABNORMAL LOW (ref 6.5–8.1)

## 2024-09-01 LAB — PROTIME-INR
INR: 1.2 (ref 0.8–1.2)
Prothrombin Time: 16.1 s — ABNORMAL HIGH (ref 11.4–15.2)

## 2024-09-01 LAB — HEMOGLOBIN AND HEMATOCRIT, BLOOD
HCT: 35.5 % — ABNORMAL LOW (ref 36.0–46.0)
HCT: 34.5 % — ABNORMAL LOW (ref 36.0–46.0)
Hemoglobin: 9.9 g/dL — ABNORMAL LOW (ref 12.0–15.0)
Hemoglobin: 9.5 g/dL — ABNORMAL LOW (ref 12.0–15.0)

## 2024-09-01 LAB — LACTIC ACID, PLASMA: Lactic Acid, Venous: 0.7 mmol/L (ref 0.5–1.9)

## 2024-09-01 LAB — POTASSIUM: Potassium: 4.6 mmol/L (ref 3.5–5.1)

## 2024-09-01 MED ORDER — SENNA 8.6 MG PO TABS
2.0000 | ORAL_TABLET | Freq: Every day | ORAL | Status: DC
  Administered 2024-09-01: 17.2 mg via ORAL
  Filled 2024-09-01: qty 2

## 2024-09-01 MED ORDER — EZETIMIBE 10 MG PO TABS
10.0000 mg | ORAL_TABLET | Freq: Every day | ORAL | Status: DC
  Administered 2024-09-01 – 2024-09-03 (×3): 10 mg via ORAL
  Filled 2024-09-01 (×3): qty 1

## 2024-09-01 MED ORDER — SODIUM CHLORIDE 0.9 % IR SOLN: 3000.0000 mL | Status: DC

## 2024-09-01 MED ORDER — ONDANSETRON HCL 4 MG/2ML IJ SOLN
4.0000 mg | Freq: Four times a day (QID) | INTRAMUSCULAR | Status: DC | PRN
  Administered 2024-09-02: 4 mg via INTRAVENOUS
  Filled 2024-09-01: qty 2

## 2024-09-01 MED ORDER — POLYETHYLENE GLYCOL 3350 17 G PO PACK
17.0000 g | PACK | Freq: Once | ORAL | Status: AC
  Administered 2024-09-01: 17 g via ORAL
  Filled 2024-09-01: qty 1

## 2024-09-01 MED ORDER — POLYETHYLENE GLYCOL 3350 17 G PO PACK
17.0000 g | PACK | Freq: Every day | ORAL | Status: DC | PRN
  Filled 2024-09-01: qty 1

## 2024-09-01 MED ORDER — SODIUM CHLORIDE 0.9 % IV BOLUS
1000.0000 mL | Freq: Once | INTRAVENOUS | Status: AC
  Administered 2024-09-01: 1000 mL via INTRAVENOUS

## 2024-09-01 MED ORDER — CHLORHEXIDINE GLUCONATE CLOTH 2 % EX PADS
6.0000 | MEDICATED_PAD | Freq: Every day | CUTANEOUS | Status: DC
  Administered 2024-09-01 – 2024-09-03 (×3): 6 via TOPICAL

## 2024-09-01 MED ORDER — SODIUM CHLORIDE 0.9 % IV SOLN
1.0000 g | Freq: Once | INTRAVENOUS | Status: AC
  Administered 2024-09-01: 1 g via INTRAVENOUS
  Filled 2024-09-01: qty 10

## 2024-09-01 MED ORDER — PANTOPRAZOLE SODIUM 40 MG PO TBEC
40.0000 mg | DELAYED_RELEASE_TABLET | Freq: Every day | ORAL | Status: DC
  Administered 2024-09-01 – 2024-09-03 (×3): 40 mg via ORAL
  Filled 2024-09-01 (×3): qty 1

## 2024-09-01 MED ORDER — ACETAMINOPHEN 500 MG PO TABS
1000.0000 mg | ORAL_TABLET | Freq: Four times a day (QID) | ORAL | Status: DC | PRN
  Administered 2024-09-02 – 2024-09-03 (×4): 1000 mg via ORAL
  Filled 2024-09-01 (×4): qty 2

## 2024-09-01 MED ORDER — POLYETHYLENE GLYCOL 3350 17 G PO PACK: 17.0000 g | PACK | Freq: Two times a day (BID) | ORAL | Status: DC

## 2024-09-01 MED ORDER — DILTIAZEM HCL ER COATED BEADS 240 MG PO CP24
240.0000 mg | ORAL_CAPSULE | Freq: Every day | ORAL | Status: DC
Start: 2024-09-01 — End: 2024-09-03
  Administered 2024-09-01 – 2024-09-03 (×3): 240 mg via ORAL
  Filled 2024-09-01 (×4): qty 1

## 2024-09-01 MED ORDER — ESCITALOPRAM OXALATE 10 MG PO TABS
20.0000 mg | ORAL_TABLET | Freq: Every day | ORAL | Status: DC
  Administered 2024-09-01 – 2024-09-03 (×3): 20 mg via ORAL
  Filled 2024-09-01 (×3): qty 2

## 2024-09-01 MED ORDER — SODIUM CHLORIDE 0.9 % IV SOLN
INTRAVENOUS | Status: AC
  Administered 2024-09-02: 75 mL/h via INTRAVENOUS

## 2024-09-01 MED ORDER — ORAL CARE MOUTH RINSE: 15.0000 mL | OROMUCOSAL | Status: DC | PRN

## 2024-09-01 MED ORDER — SODIUM ZIRCONIUM CYCLOSILICATE 5 G PO PACK
10.0000 g | PACK | Freq: Once | ORAL | Status: AC
Start: 2024-09-01 — End: 2024-09-01
  Administered 2024-09-01: 10 g via ORAL
  Filled 2024-09-01: qty 2

## 2024-09-01 MED ORDER — SODIUM CHLORIDE 0.9 % IV SOLN
1.0000 g | INTRAVENOUS | Status: DC
  Administered 2024-09-02 – 2024-09-03 (×2): 1 g via INTRAVENOUS
  Filled 2024-09-01 (×2): qty 10

## 2024-09-01 MED ORDER — MELATONIN 3 MG PO TABS
6.0000 mg | ORAL_TABLET | Freq: Every evening | ORAL | Status: DC | PRN
  Administered 2024-09-01 – 2024-09-02 (×2): 6 mg via ORAL
  Filled 2024-09-01 (×2): qty 2

## 2024-09-01 MED ORDER — ALBUTEROL SULFATE (2.5 MG/3ML) 0.083% IN NEBU: 2.5000 mg | INHALATION_SOLUTION | RESPIRATORY_TRACT | Status: DC | PRN

## 2024-09-01 MED ORDER — SODIUM CHLORIDE 0.9% FLUSH
3.0000 mL | Freq: Two times a day (BID) | INTRAVENOUS | Status: DC
Start: 2024-09-01 — End: 2024-09-03
  Administered 2024-09-01 – 2024-09-03 (×5): 3 mL via INTRAVENOUS

## 2024-09-01 MED ORDER — FENOFIBRATE 160 MG PO TABS
160.0000 mg | ORAL_TABLET | Freq: Every day | ORAL | Status: DC
  Administered 2024-09-01 – 2024-09-03 (×3): 160 mg via ORAL
  Filled 2024-09-01 (×3): qty 1

## 2024-09-01 NOTE — ED Notes (Signed)
 Pt diaper changed again after bladder scan, again with no blood.

## 2024-09-01 NOTE — Progress Notes (Signed)
 Progress Note   Patient: [name] FMW:[id number] DOB: 07-03-1951 DOA: 08/31/2024     0 DOS: the patient was seen and examined on 09/01/2024   Brief hospital admission narrative course: As per H&P written by Dr. Keturah on 09/01/2024 [name] is a 73 y.o. 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 Stent in 3/'25 for dry gangrene L hand digits, hx R AKA, CAD, carotid a. Disease, HFpEF, mod MR, CHRF on 2L O2, CKD 3a, HTN, PCV, mood d/o, who presented with gross hematuria with clots. Reports acute onset of bleeding yesterday, without prior hematuria. She had been dealing with dysuria and recently treated for UTI with Keflex  since 9/5 for 7 day course although urine Cx on 9/4 NG. She is on anticoagulation with Apixaban  and last took 9/11 AM, and also taking Aspirin  325 mg daily also last 9/11 AM.     Assessment and plan 1-gross hematuria with clots/UTI -Continue IV antibiotics - Foley catheter in place following recommendation by urology service for continuous bladder irrigation - Follow culture result - Maintain adequate hydration - Holding anticoagulation therapy at the moment - Discontinue Farxiga .  2-acute kidney injury on chronic kidney disease stage IIIa - Continue treatment for UTI - Maintain adequate hydration - Continue to minimize/avoid nephrotoxic agents and follow renal function trend.  3-hyperkalemia - Lokelma  x 1 given - Lisinopril  on hold - Follow electrolytes trend - Telemetry in place.  4-anion gap metabolic acidosis - Most likely in the setting of acute kidney injury and dehydration - Continue fluid resuscitation - Follow electrolytes trend.  5-constipation - Continue bowel regimen and adequate hydration  6-paroxysmal atrial fibrillation - Continue diltiazem  for rate control - Holding Eliquis  at the moment in the setting of acute hematuria - Follow  clearance by urology service for resumption.  7-prior history of stroke - Continue risk factors modifications and the use of a statin. - No new neurologic visit currently appreciated - Planning to continue treatment with Eliquis  once cleared by urology service.  8-peripheral arterial disease/coronary artery disease and carotid artery disease - Planning to resume the use of Eliquis  and aspirin  once cleared by urology from hematuria standpoint - Continue Refacto modifications (statin and good blood pressure control) - Continue patient follow-up with vascular surgery.  9-hypertension - Continue current antihypertensive agent - Follow-up vital signs.  10-GERD - Continue PPI.  11-polycythemia vera - Continue patient follow-up with hematology service - Patient with leukocytosis and thrombocytosis at baseline.  12-chronic respiratory failure with hypoxia - Continue home oxygen supplementation - Continue as needed bronchodilator management - Patient with underlying history of asthma/COPD; no acute exacerbation currently appreciated.   Subjective:  Afebrile, no chest pain, no nausea, no vomiting.  Bowel movements has been reported by nursing staff.  No significant hematuria currently seen.  Physical Exam: Vitals:   09/01/24 0853 09/01/24 0900 09/01/24 0930 09/01/24 0958  BP:  (!) 160/82 (!) 169/88 (!) 171/83  Pulse:  86 93   Resp:  (!) 23 17   Temp: 98.3 F (36.8 C)     TempSrc: Oral     SpO2:  95% 94%   Weight:      Height:       General exam: Alert, awake, following commands appropriate.  In no acute distress. Respiratory system: Clear to auscultation. Respiratory effort normal.  Good saturation on room air. Cardiovascular system:RRR. No rubs or gallops; positive  murmur. Gastrointestinal system: Abdomen is nondistended, soft and nontender.  Positive bowel sounds. Central nervous system: Alert and oriented. No focal neurological deficits. Extremities: No cyanosis or  clubbing; right AKA. Skin: No petechiae. Psychiatry: Judgement and insight appear normal. Mood & affect appropriate.    Data Reviewed: Lactic acid: 0.7 Hemoglobin: 9.9 with a hematocrit of 35.5 Comprehensive metabolic panel: Sodium 138, potassium 4.3, chloride 111, bicarb 16, BUN 65, creatinine 1.72, normal LFTs and GFR 31 CBC: WBCs 25.8, platelet count 423K MCV 70.3   Family Communication: No family at bedside.  Disposition: Status is: Inpatient Remains inpatient appropriate because: Continue IV antibiotics.  Time spent: 50 minutes  Author: Eric Nunnery, MD 09/01/2024 10:35 AM  For on call review [email or web address].

## 2024-09-01 NOTE — TOC CM/SW Note (Signed)
 Transition of Care [facility]) - Inpatient Brief Assessment   Patient Details  Name: [name] MRN: [id number] Date of Birth: 09-01-1951  Transition of Care [facility]) CM/SW Contact:    Lucie Lunger, LCSWA Phone Number: 09/01/2024, 9:36 AM   Clinical Narrative: Transition of Care Department [facility]) has reviewed patient and no TOC needs have been identified at this time. We will continue to monitor patient advancement through interdiciplinary progression rounds. If new patient transition needs arise, please place a TOC consult.  Transition of Care Asessment: Insurance and Status: Insurance coverage has been reviewed Patient has primary care physician: Yes Home environment has been reviewed: From home Prior level of function:: Independent Prior/Current Home Services: No current home services Social Drivers of Health Review: SDOH reviewed no interventions necessary Readmission risk has been reviewed: Yes Transition of care needs: no transition of care needs at this time

## 2024-09-01 NOTE — ED Notes (Signed)
 Pt had a large BM. Pt cleaned and placed in new brief.

## 2024-09-01 NOTE — ED Notes (Addendum)
 Patient transported to CT

## 2024-09-01 NOTE — ED Notes (Addendum)
 Changed pt and all bed linens after she had a large BM. No blood visible, only corn.

## 2024-09-01 NOTE — ED Notes (Signed)
 Spoke with Dr Sherrilee regarding 3 way catheter and will cancel now as directed since she has had numerous diaper changes with urine only and no blood.

## 2024-09-01 NOTE — H&P (Addendum)
 History and Physical    [name] FMW:[id number] DOB: 05-25-1951 DOA: 08/31/2024  PCP: Cathlene Marry Lenis, FNP (Inactive)   Patient coming from: Home   Chief Complaint:  Chief Complaint  Patient presents with   Hematuria    HPI:  [name] is a 73 y.o. 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 Stent in 3/'25 for dry gangrene L hand digits, hx R AKA, CAD, carotid a. Disease, HFpEF, mod MR, CHRF on 2L O2, CKD 3a, HTN, PCV, mood d/o, who presented with gross hematuria with clots. Reports acute onset of bleeding yesterday, without prior hematuria. She had been dealing with dysuria and recently treated for UTI with Keflex  since 9/5 for 7 day course although urine Cx on 9/4 NG. She is on anticoagulation with Apixaban  and last took 9/11 AM, and also taking Aspirin  325 mg daily also last 9/11 AM.     Review of Systems:  ROS complete and negative except as marked above   Allergies  Allergen Reactions   Atenolol Rash and Itching   Demerol   [Meperidine  Hcl] Itching   Demerol  [Meperidine ] Itching and Other (See Comments)    Unknown, can't remember    Gabapentin  Anxiety and Other (See Comments)    SI and HI   Inderal [Propranolol] Other (See Comments) and Itching    Doesn't remember    Prednisone Other (See Comments)    Muscle spasms   Statins Itching and Other (See Comments)    Simvastatin  - caused severe itching Pravastatin  - caused lesser tiching One type of statin? Caused stroke in 2006, and other symptoms as result   Warfarin And Related Other (See Comments)    Caused nose bleeds   Crestor [Rosuvastatin] Itching and Rash   Docosahexaenoic Acid (Dha) (Fish)     nosebleeds   Lactose Intolerance (Gi) Other (See Comments)    Bloating, gas   Lipitor [Atorvastatin ] Rash   Lovaza [Omega-3-Acid Ethyl Esters (Fish)] Other (See Comments)    nosebleeds     Penicillins Hives, Rash and Other (See Comments)    Immediate rash, facial/tongue/throat swelling, SOB or lightheadedness with hypotension Questionable high fever    Pravastatin  Itching and Rash   Simvastatin  Itching, Rash and Other (See Comments)   Trilipix [Choline Fenofibrate ] Other (See Comments)    Doesn't remember    Warfarin Other (See Comments)    nosebleeds   Hydralazine  Swelling   Effexor [Venlafaxine Hcl] Other (See Comments)    Doesn't remember    Nexium [Esomeprazole Magnesium ] Rash   Percocet [Oxycodone -Acetaminophen ] Other (See Comments)    Doesn't remember    Plavix  [Clopidogrel  Bisulfate] Rash    Prior to Admission medications   Medication Sig Start Date End Date Taking? Authorizing Provider  alendronate  (FOSAMAX ) 70 MG tablet TAKE ONE TABLET ONCE WEEKLY 07/24/24   Dettinger, Fonda LABOR, MD  amLODipine  (NORVASC ) 10 MG tablet TAKE ONE TABLET ONCE DAILY 07/24/24   Dettinger, Fonda LABOR, MD  apixaban  (ELIQUIS ) 2.5 MG TABS tablet Take 1 tablet (2.5 mg total) by mouth 2 (two) times daily. 05/03/24   St Morton Sebastian Pool, NP  aspirin  EC 325 MG tablet Take 325 mg by mouth daily.    [provider]  Calcium  Carbonate (CALTRATE 600 PO) Take by mouth. 04/04/24   [provider]  colchicine  0.6 MG tablet TAKE ONE TABLET ONCE DAILY 07/24/24   Dettinger, Fonda LABOR, MD  diclofenac  Sodium (VOLTAREN ) 1 % GEL Apply 2 g topically 4 (four) times daily as needed (pain). 02/02/24   Ricky Fines, MD  escitalopram  (LEXAPRO ) 20 MG tablet TAKE ONE TABLET ONCE DAILY 07/24/24   Dettinger, Fonda LABOR, MD  ezetimibe  (ZETIA ) 10 MG tablet Take 1 tablet (10 mg total) by mouth daily. 05/03/24   St Morton Sebastian Pool, NP  FARXIGA  5 MG TABS tablet Take 1 tablet (5 mg total) by mouth daily. 05/03/24   St Morton Sebastian Pool, NP  fenofibrate  (TRICOR ) 145 MG tablet Take 1 tablet (145 mg total) by mouth daily. Patient taking differently: Take 134 mg by mouth daily. 01/13/24   West, Katlyn D, NP   flecainide  (TAMBOCOR ) 50 MG tablet Take 1 tablet (50 mg total) by mouth 2 (two) times daily. 05/03/24   St Morton Sebastian Pool, NP  ipratropium-albuterol  (DUONEB) 0.5-2.5 (3) MG/3ML SOLN Take 3 mLs by nebulization.    [provider]  lisinopril  (ZESTRIL ) 10 MG tablet Take 1 tablet (10 mg total) by mouth daily. 08/08/24   Dettinger, Fonda LABOR, MD  methocarbamol (ROBAXIN) 500 MG tablet Take by mouth. 02/14/24   [provider]  ondansetron  (ZOFRAN ) 4 MG tablet Take 4 mg by mouth.    [provider]  pantoprazole  (PROTONIX ) 40 MG tablet TAKE ONE TABLET ONCE DAILY 07/24/24   Dettinger, Joshua A, MD  sulfamethoxazole-trimethoprim (BACTRIM DS) 800-160 MG tablet Take 1 tablet by mouth 2 (two) times daily. 06/06/24   [provider]  zinc  oxide 20 % ointment Apply 1 Application topically as needed for irritation.    [provider]    Past Medical History:  Diagnosis Date   Acute blood loss anemia 05/04/2020   Acute ischemic stroke (HCC)    Acute respiratory failure with hypoxia (HCC) 01/29/2024   AKI (acute kidney injury) (HCC)    Allergy ?, Don't remember   Arthritis    fingers, some in my knees (01/28/2016)   Atrial fibrillation with RVR, hx of PAF previously 04/27/2013   Paroxysmal atrial fibrillation     Formatting of this note might be different from the original.  Overview:   Paroxysmal atrial fibrillation     Last Assessment & Plan:   History of PAF maintaining sinus rhythm on flecainide  and Eliquis  oral anti-coagulation     Blood transfusion without reported diagnosis    During surgery   CAD (coronary artery disease), per cath 04/27/13, non obstructive disease, EF 65-70% 05/11/2013   Carotid disease, bilateral (HCC) 09/20/2013   Lt carotid 50-69% stentosis, less on rt   Cataract    Don't remember   Cellulitis of left leg 01/16/2022   Closed fracture of left tibial plateau 10/23/2018   Depression Don't remember   Dysrhythmia    Elevated  troponin 05/04/2020   Encounter for support and coordination of transition of care 11/11/2021   Fatty liver    Fracture of ankle 10/23/2018   GERD (gastroesophageal reflux disease)    Glaucoma    H/O cardiovascular stress test 12/27/2009   negative for ischemia   Headache    occasional since eye pressure regulated (01/28/2016)   Hospital discharge follow-up 10/15/2021   Hyperlipidemia    Hypertension    Hypertensive crisis 01/08/2016   Formatting of this note might be different from the original.  hypertension     Last Assessment & Plan:   BP at goal and significantly improved from previous visit. Patient tolerating valsartan  and chlorthalidone  very well. Reports 1 episode of symptomatic  hypotension that resolved after few hours. Denies falls or increase fatigue. Will continue current therapy without changes and follow up as nee   Hypertensive emergency 11/28/2022   Leukocytosis 07/15/2015   Migraine    stopped w/laser holes to relieve pressure in my eyes; had them 3-4 times/wk before taht (01/28/2016)   Mitral regurgitation,2+ by cath 05/11/2013   NSTEMI (non-ST elevated myocardial infarction) (HCC) 04/27/2013   Osteoporosis IDK   Pain    BOTH KNEES - PT HAS TORN MENISCUS LEFT KNEE   Pain in both wrists 09/03/2021   Pain in left knee 10/03/2018   Paroxysmal atrial fibrillation (HCC)    hx of a fib on ASA  AND ELIQUIS     Pelvic fracture (HCC) 09/09/2018   Polycythemia vera (HCC) 08/20/2015   JAK-2 positive 07/19/15   Prosthesis adjustment 01/13/2019   PVD (peripheral vascular disease) with claudication (HCC) 07/21/2006   previous L ext iliac stent and rt SFA stent, known occluded Lt SFA   S/P cardiac cath 04/27/2013   NON OBSTRUCTIVE DISEASE, 2+ mr, ef 65-70%   Sacral fracture, closed (HCC) 05/04/2020   Statin intolerance    Stroke (HCC) 12/21/2004   SWELLING ALL OVER AND RT SIDE OF FACE DRAWN AND SPEECH SLURRED AND NUMBNESS ON RIGHT SIDE-- ALL RESOLVED   TIA (transient  ischemic attack)    Trauma 05/04/2020    Past Surgical History:  Procedure Laterality Date   ABDOMINAL HYSTERECTOMY  1977   After childbirth   AMPUTATION Right 05/21/2017   Procedure: AMPUTATION ABOVE KNEE;  Surgeon: Oris Krystal FALCON, MD;  Location: [facility] OR;  Service: Vascular;  Laterality: Right;   CARDIAC CATHETERIZATION  04/27/2013   non occlusive disease with mild to mod. calcified lesions in ostial LM and proximal LAD and moderate prox. RC AND DISTAL AV GROOVE LCX, ef   CATARACT EXTRACTION W/PHACO Right 03/21/2015   Procedure: CATARACT EXTRACTION PHACO AND INTRAOCULAR LENS PLACEMENT RIGHT EYE;  Surgeon: Lynwood Hermann, MD;  Location: [facility] ORS;  Service: Ophthalmology;  Laterality: Right;  CDE:5.10   CATARACT EXTRACTION W/PHACO Left 05/16/2015   Procedure: CATARACT EXTRACTION PHACO AND INTRAOCULAR LENS PLACEMENT (IOC);  Surgeon: Lynwood Hermann, MD;  Location: [facility];  Service: Ophthalmology;  Laterality: Left;  CDE 5.57   CESAREAN SECTION  12/22/1975   COLONOSCOPY N/A 09/11/2016   Procedure: COLONOSCOPY;  Surgeon: Claudis RAYMOND Rivet, MD;  Location: [facility];  Service: Endoscopy;  Laterality: N/A;  730-moved to 1:00 Ann notified pt   EMBOLECTOMY Right 02/01/2016   Procedure: Thrombectomy of Right Femoral-Popliteal Bypass Graft; Endarterectomy of Right Below Knee Popliteal Artery and Tibial Peroneal Trunk with Bovine Pericardium Patch Angioplasty.;  Surgeon: Lonni GORMAN Blade, MD;  Location: [facility] OR;  Service: Vascular;  Laterality: Right;   EYE SURGERY     FEMORAL-POPLITEAL BYPASS GRAFT Right 01/31/2016   Procedure: BYPASS GRAFT RIGHT COMMON  FEMORALTO BELOW KNEE POPLITEAL ARTERY BYPASS GRAFT USING PROPATEN GORTEX GRAFT;  Surgeon: Lynwood JONETTA Collum, MD;  Location: [facility] OR;  Service: Vascular;  Laterality: Right;   FEMORAL-POPLITEAL BYPASS GRAFT Right 01/09/2017   Procedure: THROMBECTOMY OF RIGHT FEMORAL-POPLITEAL  ARTERY BYPASS GRAFT; THROMBECTOMY RIGHT  TIBIAL VESSELS;  Surgeon: Carlin FORBES Haddock, MD;  Location: [facility] OR;  Service: Vascular;  Laterality: Right;   FEMORAL-POPLITEAL BYPASS GRAFT Right 05/17/2017   Procedure: RIGHT FEMORAL-TIBIAL PERONEAL TRUNK ARTERY BYPASS GRAFT USING 59mmX80cm PROPATEN GRAFT WITH REMOVABLE RING;  Surgeon: Oris Krystal FALCON, MD;  Location: [facility] OR;  Service: Vascular;  Laterality: Right;  FRACTURE SURGERY     ILIAC ARTERY STENT  07/21/2006   external iliac and Rt SFA stent 07/2006 AND THE LEFT WAS IN 2006   KNEE ARTHROSCOPY WITH MEDIAL MENISECTOMY Left 03/27/2014   Procedure: left knee arthorscopy with medial chondraplasty of the medial femoral and patella, medial microfracture technique of medial femoral condyl;  Surgeon: Tanda DELENA Heading, MD;  Location: [facility] ORS;  Service: Orthopedics;  Laterality: Left;   LEFT HEART CATHETERIZATION WITH CORONARY ANGIOGRAM Right 04/27/2013   Procedure: LEFT HEART CATHETERIZATION WITH CORONARY ANGIOGRAM;  Surgeon: Alm LELON Clay, MD;  Location: [facility] CATH LAB;  Service: Cardiovascular;  Laterality: Right;   LOWER EXTREMITY ANGIOGRAM Right 01/31/2016   Procedure: INTRAOP RIGHT LOWER EXTREMITY ANGIOGRAM;  Surgeon: Lynwood JONETTA Collum, MD;  Location: [facility] OR;  Service: Vascular;  Laterality: Right;   ORIF WRIST FRACTURE Right 12/21/1981   OVARIAN CYST REMOVAL Right    PATCH ANGIOPLASTY Right 01/31/2016   Procedure: RIGHT COMMON FEMORAL AND PROFUNDA FEMORIS ENDARECTOMY WITH PATCH ANGIOPLASTY;  Surgeon: Lynwood JONETTA Collum, MD;  Location: [facility] OR;  Service: Vascular;  Laterality: Right;   PATCH ANGIOPLASTY Right 01/09/2017   Procedure: PATCH ANGIOPLASTY RIGHT POPLITEAL ARTERY BYPASS GRAFT;  Surgeon: Carlin FORBES Haddock, MD;  Location: [facility] OR;  Service: Vascular;  Laterality: Right;   PERIPHERAL VASCULAR CATHETERIZATION N/A 06/27/2015   Procedure: Lower Extremity Angiography;  Surgeon: Dorn JINNY Lesches, MD;  Location: [facility] INVASIVE CV LAB;  Service: Cardiovascular;  Laterality: N/A;   PERIPHERAL VASCULAR CATHETERIZATION Bilateral 01/29/2016   Procedure:  Lower Extremity Angiography;  Surgeon: Deatrice DELENA Cage, MD;  Location: [facility] INVASIVE CV LAB;  Service: Cardiovascular;  Laterality: Bilateral;   PERIPHERAL VASCULAR CATHETERIZATION N/A 01/29/2016   Procedure: Abdominal Aortogram;  Surgeon: Deatrice DELENA Cage, MD;  Location: [facility] INVAS[facility] CV LAB;  Service: Cardiovascular;  Laterality: N/A;   REFRACTIVE SURGERY Bilateral    6 in one eye; 7 in the other; to relieve pressure; not glaucoma (01/28/2016)   SFA Right 06/27/2015   overlapping Bahn covered stents   TUBAL LIGATION  12/21/1981   VEIN HARVEST Left 05/17/2017   Procedure: LEFT LEG GREATER SAPHENOUS VEIN HARVEST;  Surgeon: Oris Krystal FALCON, MD;  Location: [facility] OR;  Service: Vascular;  Laterality: Left;   VEIN REPAIR Right 01/31/2016   Procedure: RIGHT GREATER SAPHENOUS VEIN EXAMNED BUT NOT REMOVED;  Surgeon: Lynwood JONETTA Collum, MD;  Location: [facility] OR;  Service: Vascular;  Laterality: Right;     reports that she quit smoking about 11 years ago. Her smoking use included cigarettes. She started smoking about 56 years ago. She has a 45 pack-year smoking history. She has never used smokeless tobacco. She reports that she does not drink alcohol  and does not use drugs.  Family History  Problem Relation Age of Onset   CAD Mother    Lung cancer Mother    Heart attack Mother    Miscarriages / Stillbirths Mother    Obesity Mother    Stroke Mother    Vision loss Mother    CAD Father    Heart disease Father    Heart attack Father 48       died in hes 70's with heart disease   Hearing loss Father    Healthy Sister    Liver cancer Brother    Alcohol  abuse Brother    Arthritis Sister    CAD Brother        has had CABG   CAD Brother    Cancer Maternal Aunt  Cancer Sister      Physical Exam: Vitals:   09/01/24 0230 09/01/24 0332 09/01/24 0515 09/01/24 0530  BP: (!) 160/75  (!) 152/79 (!) 168/83  Pulse: 88   88  Resp: 18  15 16   Temp:  98.5 F (36.9 C)    TempSrc:  Oral    SpO2: 93%  95% 95%   Weight:      Height:        Gen: Awake, alert, chronically ill appearing   CV: Regular, normal S1, S2, blowing holosystolic murmur; radiates throughout precordium + to Abd  Resp: Normal WOB, CTAB  Abd: Flat, normoactive, nontender GU: No external lesions, incontinent with no urine visible only stool. No foley at time of my evaluation  MSK: R AKA. Atrophy of extremities. LLE with no palpable pedal pulse, + < 0.5 cm ulceration over lateral malleolus  Skin: See MSK for additional findings  Neuro: Alert and interactive  Psych: euthymic, appropriate    Data review:   Labs reviewed, notable for:   K 5.2  Bicarb 16, AG 11  Cr 1.72 (b/l 1.1 - 1.2)  WBC 25 Hb 10, MCV 70  PLT 423  INR 1.2  UA with large blood, few bacteria, > 50 WBC   Micro:  Results for orders placed or performed during the hospital encounter of 08/24/24  Resp panel by RT-PCR (RSV, Flu A&B, Covid) Anterior Nasal Swab     Status: None   Collection Time: 08/24/24 12:31 AM   Specimen: Anterior Nasal Swab  Result Value Ref Range Status   SARS Coronavirus 2 by RT PCR NEGATIVE NEGATIVE Final    Comment: (NOTE) SARS-CoV-2 target nucleic acids are NOT DETECTED.  The SARS-CoV-2 RNA is generally detectable in upper respiratory specimens during the acute phase of infection. The lowest concentration of SARS-CoV-2 viral copies this assay can detect is 138 copies/mL. A negative result does not preclude SARS-Cov-2 infection and should not be used as the sole basis for treatment or other patient management decisions. A negative result may occur with  improper specimen collection/handling, submission of specimen other than nasopharyngeal swab, presence of viral mutation(s) within the areas targeted by this assay, and inadequate number of viral copies(<138 copies/mL). A negative result must be combined with clinical observations, patient history, and epidemiological information. The expected result is Negative.  Fact Sheet  for Patients:  BloggerCourse.com  Fact Sheet for Healthcare Providers:  SeriousBroker.it  This test is no t yet approved or cleared by the [place]  FDA and  has been authorized for detection and/or diagnosis of SARS-CoV-2 by FDA under an Emergency Use Authorization (EUA). This EUA will remain  in effect (meaning this test can be used) for the duration of the COVID-19 declaration under Section 564(b)(1) of the Act, 21 U.S.C.section 360bbb-3(b)(1), unless the authorization is terminated  or revoked sooner.       Influenza A by PCR NEGATIVE NEGATIVE Final   Influenza B by PCR NEGATIVE NEGATIVE Final    Comment: (NOTE) The Xpert Xpress SARS-CoV-2/FLU/RSV plus assay is intended as an aid in the diagnosis of influenza from Nasopharyngeal swab specimens and should not be used as a sole basis for treatment. Nasal washings and aspirates are unacceptable for Xpert Xpress SARS-CoV-2/FLU/RSV testing.  Fact Sheet for Patients: BloggerCourse.com  Fact Sheet for Healthcare Providers: SeriousBroker.it  This test is not yet approved or cleared by the [place]  FDA and has been authorized for detection and/or diagnosis of SARS-CoV-2 by FDA under an Emergency Use  Authorization (EUA). This EUA will remain in effect (meaning this test can be used) for the duration of the COVID-19 declaration under Section 564(b)(1) of the Act, 21 U.S.C. section 360bbb-3(b)(1), unless the authorization is terminated or revoked.     Resp Syncytial Virus by PCR NEGATIVE NEGATIVE Final    Comment: (NOTE) Fact Sheet for Patients: BloggerCourse.com  Fact Sheet for Healthcare Providers: SeriousBroker.it  This test is not yet approved or cleared by the [place]  FDA and has been authorized for detection and/or diagnosis of SARS-CoV-2 by FDA under an  Emergency Use Authorization (EUA). This EUA will remain in effect (meaning this test can be used) for the duration of the COVID-19 declaration under Section 564(b)(1) of the Act, 21 U.S.C. section 360bbb-3(b)(1), unless the authorization is terminated or revoked.  Performed at [facility], [address] [place], [place] [address]   Urine Culture     Status: None   Collection Time: 08/24/24  1:08 AM   Specimen: Urine, Catheterized  Result Value Ref Range Status   Specimen Description   Final    URINE, CATHETERIZED Performed at [facility], [address], [place], [place] [address]    Special Requests   Final    NONE Performed at [facility], [address], [place], [place] [address]    Culture   Final    NO GROWTH Performed at [facility], [address], [place], [place] [address]    Report Status 08/25/2024 FINAL  Final   *Note: Due to a large number of results and/or encounters for the requested time period, some results have not been displayed. A complete set of results can be found in Results Review.    Imaging reviewed:  CT Renal Stone Study Result Date: 09/01/2024 EXAM: CT UROGRAM 09/01/2024 03:10:35 AM TECHNIQUE: CT of the abdomen and pelvis was performed without IV contrast. Automated exposure control, iterative reconstruction, and/or weight based adjustment of the mA/kV was utilized to reduce the radiation dose to as low as reasonably achievable. COMPARISON: 08/26/2022 CLINICAL HISTORY: Abdominal/flank pain, stone suspected. Patient from home for blood in urine with blood clots that started today. Reports painful urination. FINDINGS: LOWER CHEST: No acute abnormality. LIVER: The liver is unremarkable. GALLBLADDER AND BILE DUCTS: Cholelithiasis without evidence of acute cholecystitis. SPLEEN: Markedly enlarged spleen measuring 19.1 cm in craniocaudal dimension. This has increased from 08/26/22, when it measured 17.0 cm. PANCREAS: No acute abnormality. ADRENAL GLANDS:  No acute abnormality. KIDNEYS, URETERS AND BLADDER: No urinary calculi or hydronephrosis. Vascular calcifications in the bilateral renal arteries. Mild perivesical fat stranding. Correlate for urinalysis for cystitis. 1.3 x 1.3 cm mural nodule in the posterior left bladder (series 2 image 74). This is incompletely evaluated without IV contrast. In the setting of hematuria, this could represent a blood clot. However bladder malignancy is not excluded. Cystoscopy is recommended to further evaluate. GI AND BOWEL: Stool ball in the rectum. There is no bowel obstruction. PERITONEUM AND RETROPERITONEUM: No ascites. No free air. VASCULATURE: Advanced aortic atherosclerotic calcification. LYMPH NODES: No lymphadenopathy. REPRODUCTIVE ORGANS: No acute abnormality. BONES AND SOFT TISSUES: No acute osseous abnormality. No focal soft tissue abnormality. IMPRESSION: 1. 1.3 cm mural nodule in the posterior left bladder, incompletely evaluated without IV contrast. Differential includes blood clot versus malignancy. Cystoscopy is recommended. 2. Mild perivesical fat stranding, correlate for urinalysis for cystitis. 3. Markedly enlarged spleen measuring 19.1 cm, increased from 17.0 cm on 08/26/22. 4. Fecal ball in the rectum. Correlate for constipation. Electronically signed by:  Norman Gatlin MD 09/01/2024 03:43 AM EDT RP Workstation: [id number]    EKG:  ST, without acute ischemic changes     ED Course:  Treated with Ceftriaxone  , 1 L NS, miralax     Assessment/Plan:  73 y.o. 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 Stent in 3/'25 for dry gangrene L hand digits, hx R AKA, CAD, carotid a. Disease, HFpEF, mod MR, CHRF on 2L O2, CKD 3a, HTN, PCV, mood d/o, who presented with hematuria   Gross hematuria with clots, c/f developing urinary obstruction  ? L bladder nodule v clot  Urinary tract infection  P/W acute  gross hematuria + clot x 1 day, associated with Eliquis , aspirin  use (last 9/11 am). Hb 10.3, slightly down from 10.9 last month b/l ~ 11. Associated AKI per below, C/f developing obstruction. UA concerning for underlying UTI apart from gross blood; recent Ucx on 9/4 was NG,  no hx resistant organisms or micro growth in records here. As OP was recently treated with Keflex  9/5 start x 7 d. CT renal with 1.3 cm mural nodule in the posterior left bladder? blood clot versus malignancy, and perivasical stranding.  -- Routine Urology consult this morning - spoke with Dr. Cleve in agreement with 3 way and recommends for CBI, Ucx, Abx, no need for NPO at this time no immediate plan for cystoscopy. Urology will consult  -- Place 22Fr 3 way foley., + monitor output, irrigation per urology service  -- Serial blood counts, ordered q 8 for now  -- Hold home AC/ AntiPLT, she is higher risk for AE of discontinuation given advanced PAD, stroke, although events ~ 6 mo ago. Resume as soon as able.  -- Continue Ceftriaxone  1g IV q 24 hr for now  -- Would temporarily discontinue her farxiga  to reduce risk of UTI at discharge   Acute kidney injury stage I  Mild hyperkalemia  CKD stage 3a Baseline Cr ~ 1.1 - 1.2. With her hematuria + clots c/f developing obstruction  -- Place 3 way foley per above  -- IVF NS at 75 cc/hr  -- Dose of Lokelma , repeat K in PM  -- Hold home lisinopril    NAGMA  Possibly from renal injury  -- Check lactate -- IVF per above   Rectal stool ball  -- Soap sud enema, + bowel regimen   Chronic medical problems:  Paroxysmal A-fib: Currently in SR. continue home diltiazem , hold home Eliquis .  Resume as soon as able considering her history of recent stroke, advanced vascular disease History Pontine stroke 2/' 25: C/B R hemiparesis, and aphasia. holding AC / aspirin .  Continue home fenofibrate , Zetia  PAD: multiple prior stent, most recent left subclavian artery stent in 3/'25 for dry  gangrene, R AKA, holding AC / aspirin  CAD, carotid artery disease: See PAD/stroke above HFpEF: Hold home lasix  I/s/o bleeding, gentle hydration above. Also would temporarily stop farxiga  at discharge see UTI above.  Moderate MR: Noted, outpatient surveillance. CHRF on 2 L O2: Hx COPD/asthma by review, PHTN suggested on imaging. Continue home O2.  Hypertension: Continue home Diltiazem . Hold amlodipine  in setting of bleeding. Hold Lisinopril  with AKI / bleeding.  PCV: Followed by heme OP with known JAK2+ PCV, with leukocytosis, thrombocytosis. Likely splenomegaly related. F/u heme OP.  Mood disorder: Continue home escitalopram   GERD: continue home PPI    Body mass index is 16.37 kg/m. Mild protein calorie malnutrition  DVT prophylaxis:  SCDs- on left  Code Status:  DNR/DNI(Do NOT Intubate) Diet:  Diet Orders (From admission, onward)    None      Family Communication:  None   Consults:  Urology   Admission status:   Inpatient, Telemetry bed  Severity of Illness: The appropriate patient status for this patient is INPATIENT. Inpatient status is judged to be reasonable and necessary in order to provide the required intensity of service to ensure the patient's safety. The patient's presenting symptoms, physical exam findings, and initial radiographic and laboratory data in the context of their chronic comorbidities is felt to place them at high risk for further clinical deterioration. Furthermore, it is not anticipated that the patient will be medically stable for discharge from the hospital within 2 midnights of admission.   * I certify that at the point of admission it is my clinical judgment that the patient will require inpatient hospital care spanning beyond 2 midnights from the point of admission due to high intensity of service, high risk for further deterioration and high frequency of surveillance required.*   Dorn Dawson, MD [facility]  How to contact the [facility]  Attending or Consulting provider 7A - 7P or covering provider during after hours 7P -7A, for this patient.  Check the care team in [facility] and look for a) attending/consulting [facility] provider listed and b) the [facility] team listed Log into [email or web address] and use [place]'s universal password to access. If you do not have the password, please contact the hospital operator. Locate the [facility] provider you are looking for under [facility] and page to a number that you can be directly reached. If you still have difficulty reaching the provider, please page the [facility] (Director on Call) for the Hospitalists listed on amion for assistance.  09/01/2024, 6:39 AM
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
## 2024-09-01 NOTE — ED Notes (Signed)
 Cahnged pt diaper after a urine. No blood visible. Pt very grateful that no blood at this time.

## 2024-09-01 NOTE — ED Notes (Signed)
 Contacted SWOT RN after CN said to in order to get assistance with information pertaining to the 3 way catheter. Discussed with SWOT RN about pt urinating freely and none of the dirty diapers have contained any blood tinging at all. She spoke with the hospitalist in the room who said I need to contact urology for input. I contacted McKenzie @ [phone] and left number to return call to get information regarding this. Still awaiting call back.

## 2024-09-01 NOTE — ED Notes (Signed)
 Patient transported to CT

## 2024-09-01 NOTE — ED Notes (Signed)
 Pt soiled brief and had a small bm. Pt cleaned and placed in clean brief. Bloody urine noted in brief. Pt sitting up in bed. NAD. VSS. Will continue to monitor.

## 2024-09-02 DIAGNOSIS — N3001 Acute cystitis with hematuria: Secondary | ICD-10-CM | POA: Diagnosis not present

## 2024-09-02 LAB — BASIC METABOLIC PANEL WITH GFR
Anion gap: 7 (ref 5–15)
BUN: 44 mg/dL — ABNORMAL HIGH (ref 8–23)
CO2: 18 mmol/L — ABNORMAL LOW (ref 22–32)
Calcium: 7.6 mg/dL — ABNORMAL LOW (ref 8.9–10.3)
Chloride: 112 mmol/L — ABNORMAL HIGH (ref 98–111)
Creatinine, Ser: 1.24 mg/dL — ABNORMAL HIGH (ref 0.44–1.00)
GFR, Estimated: 46 mL/min — ABNORMAL LOW (ref >60)
Glucose, Bld: 107 mg/dL — ABNORMAL HIGH (ref 70–99)
Potassium: 3.9 mmol/L (ref 3.5–5.1)
Sodium: 137 mmol/L (ref 135–145)

## 2024-09-02 LAB — CBC
HCT: 35.4 % — ABNORMAL LOW (ref 36.0–46.0)
Hemoglobin: 9.7 g/dL — ABNORMAL LOW (ref 12.0–15.0)
MCH: 19.8 pg — ABNORMAL LOW (ref 26.0–34.0)
MCHC: 27.4 g/dL — ABNORMAL LOW (ref 30.0–36.0)
MCV: 72.2 fL — ABNORMAL LOW (ref 80.0–100.0)
Platelets: 353 K/uL (ref 150–400)
RBC: 4.9 MIL/uL (ref 3.87–5.11)
RDW: 24.9 % — ABNORMAL HIGH (ref 11.5–15.5)
WBC: 24.6 K/uL — ABNORMAL HIGH (ref 4.0–10.5)
nRBC: 0.1 % (ref 0.0–0.2)

## 2024-09-02 LAB — HEMOGLOBIN AND HEMATOCRIT, BLOOD
HCT: 34.1 % — ABNORMAL LOW (ref 36.0–46.0)
Hemoglobin: 9.4 g/dL — ABNORMAL LOW (ref 12.0–15.0)

## 2024-09-02 LAB — URINE CULTURE: Culture: <10000 — AB

## 2024-09-02 MED ORDER — LISINOPRIL 10 MG PO TABS
10.0000 mg | ORAL_TABLET | Freq: Every day | ORAL | Status: DC
  Administered 2024-09-02 – 2024-09-03 (×2): 10 mg via ORAL
  Filled 2024-09-02 (×2): qty 1
  Filled 2024-09-02: qty 2

## 2024-09-02 MED ORDER — APIXABAN 2.5 MG PO TABS
2.5000 mg | ORAL_TABLET | Freq: Two times a day (BID) | ORAL | Status: DC
Start: 2024-09-02 — End: 2024-09-03
  Administered 2024-09-02 – 2024-09-03 (×2): 2.5 mg via ORAL
  Filled 2024-09-02 (×2): qty 1

## 2024-09-02 MED ORDER — AMLODIPINE BESYLATE 5 MG PO TABS
10.0000 mg | ORAL_TABLET | Freq: Every day | ORAL | Status: DC
  Administered 2024-09-02 – 2024-09-03 (×2): 10 mg via ORAL
  Filled 2024-09-02 (×3): qty 2

## 2024-09-02 NOTE — Plan of Care (Signed)
  Problem: Education: Goal: Knowledge of General Education information will improve Description: Including pain rating scale, medication(s)/side effects and non-pharmacologic comfort measures Outcome: Progressing   Problem: Clinical Measurements: Goal: Ability to maintain clinical measurements within normal limits will improve Outcome: Progressing   Problem: Pain Managment: Goal: General experience of comfort will improve and/or be controlled Outcome: Progressing   Problem: Skin Integrity: Goal: Risk for impaired skin integrity will decrease Outcome: Progressing

## 2024-09-02 NOTE — Progress Notes (Signed)
 Progress Note   Patient: [name] FMW:[id number] DOB: Feb 08, 1951 DOA: 08/31/2024     1 DOS: the patient was seen and examined on 09/02/2024   Brief hospital admission narrative course: As per H&P written by Dr. Keturah on 09/01/2024 [name] is a 73 y.o. 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 Stent in 3/'25 for dry gangrene L hand digits, hx R AKA, CAD, carotid a. Disease, HFpEF, mod MR, CHRF on 2L O2, CKD 3a, HTN, PCV, mood d/o, who presented with gross hematuria with clots. Reports acute onset of bleeding yesterday, without prior hematuria. She had been dealing with dysuria and recently treated for UTI with Keflex  since 9/5 for 7 day course although urine Cx on 9/4 NG. She is on anticoagulation with Apixaban  and last took 9/11 AM, and also taking Aspirin  325 mg daily also last 9/11 AM.     Assessment and plan 1-gross hematuria with clots/UTI -Continue IV antibiotics - Will continue assessing the need for Foley catheter placement; bladder scans has been instructed - No further hematuria seen - Continue to maintain adequate hydration - Planning to resume the use of Eliquis  later today; in the near future planning for daily baby aspirin  instead of full dose. - Continue to follow hemoglobin stability - Farxiga  will remain discontinued.  2-acute kidney injury on chronic kidney disease stage IIIa - Continue treatment for UTI - Continue to maintain adequate hydration - Continue to minimize/avoid nephrotoxic agents and follow renal function trend. - Improving/stabilizing.  3-hyperkalemia - Lokelma  x 1 given - Continue to hold lisinopril  - Potassium has normalized - Safe to discontinue telemetry.  4-anion gap metabolic acidosis - Most likely in the setting of acute kidney injury and dehydration - Continue to maintain adequate hydration - Lactic acid within normal limits  now and bicarb up to 18. - Continue to follow electrolytes trend.  5-constipation - Continue bowel regimen and adequate hydration  6-paroxysmal atrial fibrillation - Continue diltiazem  for rate control - Holding Eliquis  at the moment in the setting of acute hematuria - Follow clearance by urology service for resumption.  7-prior history of stroke - Continue risk factors modifications and the use of a statin. - No new neurologic visit currently appreciated - Planning to restart treatment with Eliquis  later today. - In the near future low dose (baby aspirin ) on daily basis.  8-peripheral arterial disease/coronary artery disease and carotid artery disease - Planning to resume the use of Eliquis  and aspirin  once cleared by urology from hematuria standpoint - Continue Refacto modifications (statin and good blood pressure control) - Continue patient follow-up with vascular surgery.  9-hypertension - Continue current antihypertensive agent - Follow-up vital signs.  10-GERD - Continue PPI.  11-polycythemia vera - Continue patient follow-up with hematology service - Patient with leukocytosis and thrombocytosis at baseline.  12-chronic respiratory failure with hypoxia - Continue home oxygen supplementation - Continue as needed bronchodilator management - Patient with underlying history of asthma/COPD; no acute exacerbation currently appreciated.   Subjective:  No fever, no chest pain, no nausea, no vomiting.  Patient reports no abdominal pain and expressed feeling better overall.  Currently without hematuria.  Physical Exam: Vitals:   09/01/24 1949 09/02/24 0105 09/02/24 0529 09/02/24 1000  BP: (!) 168/67 (!) 173/66 (!) 181/74   Pulse: 79 78 77 94  Resp:  18 18 18   Temp: 98.5 F (36.9 C) 98.3 F (36.8  C) 97.8 F (36.6 C)   TempSrc: Oral Oral Oral   SpO2: 94% 94% 94%   Weight:      Height:       General exam: Alert, awake, oriented x 3; in no acute distress.  Reports  feeling better overall. Respiratory system: Good saturation on chronic supplementation; no using accessory muscles.  Good air movement bilaterally. Cardiovascular system: Rate controlled, no rubs, no gallops, no JVD. Gastrointestinal system: Abdomen is nondistended, soft and nontender. No organomegaly or masses felt. Normal bowel sounds heard. Central nervous system: No new focal neurological deficits. Extremities: No cyanosis or clubbing; right AKA appreciated on exam. Skin: No rashes, lesions or ulcers Psychiatry: Judgement and insight appear normal. Mood & affect appropriate.   Latest data Reviewed: Lactic acid: 0.7 CBC: White blood cell 24.6, hemoglobin 9.7 and platelet count 343K Basic metabolic panel: Sodium 137, potassium 3.9, chloride 112, bicarb 18, BUN 44, creatinine 1.24 and GFR 46   Family Communication: No family at bedside.  Disposition: Status is: Inpatient Remains inpatient appropriate because: Continue IV antibiotics.  Time spent: 50 minutes  Author: Eric Nunnery, MD 09/02/2024 12:06 PM  For on call review [email or web address].

## 2024-09-02 NOTE — Plan of Care (Signed)
   Problem: Education: Goal: Knowledge of General Education information will improve Description: Including pain rating scale, medication(s)/side effects and non-pharmacologic comfort measures Outcome: Progressing   Problem: Clinical Measurements: Goal: Will remain free from infection Outcome: Progressing   Problem: Coping: Goal: Level of anxiety will decrease Outcome: Progressing

## 2024-09-02 NOTE — Progress Notes (Signed)
 Patient alert and verbal, reported pain to back given PRN Tylenol  at 1237. Patient reported was helpful. Bladder scanned patient after voiding three times during shift, bladder scan readings were 0 mL, 55 mL, and 57 mL. MD Ricky made aware. Per MD bladder scan patient TID after voiding. Noted patients urine bloody, noted no blood clots. MD Ricky made aware.

## 2024-09-03 DIAGNOSIS — N3001 Acute cystitis with hematuria: Secondary | ICD-10-CM | POA: Diagnosis not present

## 2024-09-03 LAB — BASIC METABOLIC PANEL WITH GFR
Anion gap: 9 (ref 5–15)
BUN: 39 mg/dL — ABNORMAL HIGH (ref 8–23)
CO2: 16 mmol/L — ABNORMAL LOW (ref 22–32)
Calcium: 7.7 mg/dL — ABNORMAL LOW (ref 8.9–10.3)
Chloride: 108 mmol/L (ref 98–111)
Creatinine, Ser: 1.29 mg/dL — ABNORMAL HIGH (ref 0.44–1.00)
GFR, Estimated: 44 mL/min — ABNORMAL LOW (ref >60)
Glucose, Bld: 89 mg/dL (ref 70–99)
Potassium: 4.3 mmol/L (ref 3.5–5.1)
Sodium: 133 mmol/L — ABNORMAL LOW (ref 135–145)

## 2024-09-03 LAB — CBC
HCT: 32.6 % — ABNORMAL LOW (ref 36.0–46.0)
Hemoglobin: 9 g/dL — ABNORMAL LOW (ref 12.0–15.0)
MCH: 19.8 pg — ABNORMAL LOW (ref 26.0–34.0)
MCHC: 27.6 g/dL — ABNORMAL LOW (ref 30.0–36.0)
MCV: 71.6 fL — ABNORMAL LOW (ref 80.0–100.0)
Platelets: 340 K/uL (ref 150–400)
RBC: 4.55 MIL/uL (ref 3.87–5.11)
RDW: 24.2 % — ABNORMAL HIGH (ref 11.5–15.5)
WBC: 25 K/uL — ABNORMAL HIGH (ref 4.0–10.5)
nRBC: 0.2 % (ref 0.0–0.2)

## 2024-09-03 LAB — MAGNESIUM: Magnesium: 1.3 mg/dL — ABNORMAL LOW (ref 1.7–2.4)

## 2024-09-03 MED ORDER — SODIUM BICARBONATE 650 MG PO TABS: 650.0000 mg | ORAL_TABLET | Freq: Two times a day (BID) | ORAL | 0 refills | Status: DC

## 2024-09-03 MED ORDER — MAGNESIUM SULFATE 2 GM/50ML IV SOLN
2.0000 g | Freq: Once | INTRAVENOUS | Status: AC
Start: 2024-09-03 — End: 2024-09-03
  Administered 2024-09-03: 2 g via INTRAVENOUS
  Filled 2024-09-03: qty 50

## 2024-09-03 MED ORDER — SODIUM BICARBONATE 650 MG PO TABS
650.0000 mg | ORAL_TABLET | Freq: Two times a day (BID) | ORAL | Status: DC
  Administered 2024-09-03: 650 mg via ORAL
  Filled 2024-09-03: qty 1

## 2024-09-03 MED ORDER — CEFADROXIL 500 MG PO CAPS: 500.0000 mg | ORAL_CAPSULE | Freq: Two times a day (BID) | ORAL | 0 refills | Status: AC

## 2024-09-03 MED ORDER — FUROSEMIDE 40 MG PO TABS: 40.0000 mg | ORAL_TABLET | Freq: Every day | ORAL | Status: AC | PRN

## 2024-09-03 MED ORDER — ASPIRIN 81 MG PO TBEC: 81.0000 mg | DELAYED_RELEASE_TABLET | Freq: Every day | ORAL | 5 refills | Status: DC

## 2024-09-03 NOTE — Progress Notes (Signed)
   09/02/24 1922  Assess: MEWS Score  Temp 98.6 F (37 C)  BP (!) 203/64  MAP (mmHg) 105  Pulse Rate 89  Resp 18  SpO2 92 %  O2 Device Room Air  Assess: MEWS Score  MEWS Temp 0  MEWS Systolic 2  MEWS Pulse 0  MEWS RR 0  MEWS LOC 0  MEWS Score 2  MEWS Score Color Yellow  Assess: if the MEWS score is Yellow or Red  Were vital signs accurate and taken at a resting state? Yes  Does the patient meet 2 or more of the SIRS criteria? Yes  Does the patient have a confirmed or suspected source of infection? Yes  MEWS guidelines implemented  Yes, yellow  Treat  MEWS Interventions Considered administering scheduled or prn medications/treatments as ordered  Take Vital Signs  Increase Vital Sign Frequency  Yellow: Q2hr x1, continue Q4hrs until patient remains green for 12hrs  Escalate  MEWS: Escalate Yellow: Discuss with charge nurse and consider notifying provider and/or RRT  Notify: Charge Nurse/RN  Name of Charge Nurse/RN Notified Metta, RN  Provider Notification  Provider Name/Title M. Arrien, MD  Date Provider Notified 09/02/24  Time Provider Notified 2013  Method of Notification Page  Notification Reason Change in status (MEWS yellow)  Provider response Evaluate remotely  Date of Provider Response 09/02/24  Time of Provider Response 2053  Assess: SIRS CRITERIA  SIRS Temperature  0  SIRS Respirations  0  SIRS Pulse 0  SIRS WBC 0  SIRS Score Sum  0

## 2024-09-03 NOTE — Discharge Summary (Signed)
 Physician Discharge Summary   Patient: [name] MRN: [id number] DOB: [birth year]  Admit date:     08/31/2024  Discharge date: 09/03/24  Discharge Physician: Eric Nunnery   PCP: Cathlene Marry Lenis, FNP (Inactive)   Recommendations at discharge:  Repeat basic metabolic panel to follow electrolytes and renal function Reassess patient volume status and vital signs with further adjustment to antihypertensive and diuretics agents as required. Make sure patient follow-up with urology service as instructed Repeat CBC to follow hemoglobin trend/stability.  Discharge Diagnoses: Principal Problem:   Urinary tract infection Active Problems:   Gross hematuria   Hematuria   Hematuria due to acute cystitis  Brief hospital admission narrative course: As per H&P written by Dr. Keturah on 09/01/2024 [name] is a 73 y.o. 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 Stent in 3/'25 for dry gangrene L hand digits, hx R AKA, CAD, carotid a. Disease, HFpEF, mod MR, CHRF on 2L O2, CKD 3a, HTN, PCV, mood d/o, who presented with gross hematuria with clots. Reports acute onset of bleeding yesterday, without prior hematuria. She had been dealing with dysuria and recently treated for UTI with Keflex  since 9/5 for 7 day course although urine Cx on 9/4 NG. She is on anticoagulation with Apixaban  and last took 9/11 AM, and also taking Aspirin  325 mg daily also last 9/11 AM.    Assessment and Plan: 1-gross hematuria with clots/UTI - IV antibiotics transition to oral cefadroxil  to complete therapy - No further significant hematuria seen - Continue to maintain adequate hydration - Planning to resume baby aspirin  in 1 week; safe to continue treatment with Eliquis  at discharge. - Continue to follow hemoglobin trend/stability with repeat CBC at follow-up visit. - Farxiga  will remain discontinued.    2-acute kidney injury on chronic kidney disease stage IIIa - Continue treatment for UTI - Continue to maintain adequate hydration - Continue to minimize/avoid nephrotoxic agents and follow renal function trend with repeat basic metabolic panel at follow-up visit.[name] - Improved and back to baseline at discharge.   3-hyperkalemia - Lokelma  x 1 given - Continue to hold lisinopril  - Potassium has normalized - Safe to discontinue telemetry.   4-anion gap metabolic acidosis - Most likely in the setting of acute kidney injury and dehydration - Continue to maintain adequate hydration - Lactic acid within normal limits now and bicarb up to 18. - Continue to follow electrolytes trend.   5-constipation - Continue bowel regimen and adequate hydration   6-paroxysmal atrial fibrillation - Continue diltiazem  for rate control - Continue Eliquis  for secondary prevention.   7-prior history of stroke - Continue risk factors modifications and the use of a statin. - No new neurologic visit currently appreciated - Planning to restart treatment with Eliquis  later today. - In the near future low dose (baby aspirin ) on daily basis.   8-peripheral arterial disease/coronary artery disease and carotid artery disease - Patient has been discharged on Eliquis  with intention to restart aspirin  in 1 week (81 mg daily). - Continue Refacto modifications (statin and good blood pressure control) - Continue outpatient follow-up with vascular surgery.   9-hypertension - Continue current antihypertensive agent - Follow-up vital signs.   10-GERD - Continue PPI.   11-polycythemia vera - Continue patient follow-up with hematology service - Patient with leukocytosis and thrombocytosis at baseline. - Repeat CBC at follow-up visit.   12-chronic respiratory failure with hypoxia -  Continue home oxygen supplementation - Continue as needed bronchodilator management - Patient with underlying history of asthma/COPD;  no acute exacerbation currently appreciated.   Consultants: Urology Procedures performed: See below for x-ray reports. Disposition: Home Diet recommendation: Heart healthy/low-sodium diet.  DISCHARGE MEDICATION: Allergies as of 09/03/2024       Reactions   Atenolol Rash, Itching   Demerol   [meperidine  Hcl] Itching   Demerol  [meperidine ] Itching, Other (See Comments)   Unknown, can't remember    Gabapentin  Anxiety, Other (See Comments)   SI and HI   Inderal [propranolol] Other (See Comments), Itching   Doesn't remember    Prednisone Other (See Comments)   Muscle spasms   Statins Itching, Other (See Comments)   Simvastatin  - caused severe itching Pravastatin  - caused lesser tiching One type of statin? Caused stroke in 2006, and other symptoms as result   Warfarin And Related Other (See Comments)   Caused nose bleeds   Crestor [rosuvastatin] Itching, Rash   Docosahexaenoic Acid (dha) (fish)    nosebleeds   Lactose Intolerance (gi) Other (See Comments)   Bloating, gas   Lipitor [atorvastatin ] Rash   Lovaza [omega-3-acid Ethyl Esters (fish)] Other (See Comments)   nosebleeds   Penicillins Hives, Rash, Other (See Comments)   Immediate rash, facial/tongue/throat swelling, SOB or lightheadedness with hypotension Questionable high fever    Pravastatin  Itching, Rash   Simvastatin  Itching, Rash, Other (See Comments)   Trilipix [choline Fenofibrate ] Other (See Comments)   Doesn't remember    Warfarin Other (See Comments)   nosebleeds   Hydralazine  Swelling   Effexor [venlafaxine Hcl] Other (See Comments)   Doesn't remember    Nexium [esomeprazole Magnesium ] Rash   Percocet [oxycodone -acetaminophen ] Other (See Comments)   Doesn't remember    Plavix  [clopidogrel  Bisulfate] Rash        Medication List     STOP taking these medications    cephALEXin  500 MG capsule Commonly known as: KEFLEX    Farxiga  5 MG Tabs tablet Generic drug: dapagliflozin  propanediol        TAKE these medications    alendronate  70 MG tablet Commonly known as: FOSAMAX  TAKE ONE TABLET ONCE WEEKLY   amLODipine  10 MG tablet Commonly known as: NORVASC  TAKE ONE TABLET ONCE DAILY   apixaban  2.5 MG Tabs tablet Commonly known as: ELIQUIS  Take 1 tablet (2.5 mg total) by mouth 2 (two) times daily.   aspirin  EC 81 MG tablet Take 1 tablet (81 mg total) by mouth daily. Swallow whole. Start taking on: September 11, 2024 What changed:  medication strength how much to take additional instructions These instructions start on September 11, 2024. If you are unsure what to do until then, ask your doctor or other care provider.   calcitRIOL 0.25 MCG capsule Commonly known as: ROCALTROL Take 0.25 mcg by mouth 2 (two) times a week.   CALTRATE 600 PO Take by mouth.   cefadroxil  500 MG capsule Commonly known as: DURICEF Take 1 capsule (500 mg total) by mouth 2 (two) times daily for 6 days.   colchicine  0.6 MG tablet TAKE ONE TABLET ONCE DAILY   diclofenac  Sodium 1 % Gel Commonly known as: VOLTAREN  Apply 2 g topically 4 (four) times daily as needed (pain).   diltiazem  240 MG 24 hr capsule Commonly known as: CARDIZEM  CD Take 240 mg by mouth daily.   escitalopram  20 MG tablet Commonly known as: LEXAPRO  TAKE ONE TABLET ONCE DAILY   ezetimibe  10 MG tablet Commonly known as: ZETIA  Take 1 tablet (  10 mg total) by mouth daily.   fenofibrate  145 MG tablet Commonly known as: TRICOR  Take 1 tablet (145 mg total) by mouth daily.   furosemide  40 MG tablet Commonly known as: LASIX  Take 1 tablet (40 mg total) by mouth daily as needed for fluid or edema. What changed:  when to take this reasons to take this   lisinopril  10 MG tablet Commonly known as: ZESTRIL  Take 1 tablet (10 mg total) by mouth daily.   pantoprazole  40 MG tablet Commonly known as: PROTONIX  TAKE ONE TABLET ONCE DAILY   sodium bicarbonate  650 MG tablet Take 1 tablet (650 mg total) by mouth 2 (two) times  daily.   zinc  oxide 20 % ointment Apply 1 Application topically as needed for irritation.        Follow-up Information     Connect with your PCP/Specialist as discussed. Schedule an appointment as soon as possible for a visit .   Contact information: [email or web address] Call our physician referral line at [phone].        Cathlene Marry Lenis, FNP. Schedule an appointment as soon as possible for a visit in 10 day(s).   Specialty: Family Medicine Contact information: [address] [place] [address] [phone]         Sherrilee Belvie CROME, MD. Call today.   Specialty: Urology Why: Call office to arrange outpatient visit for potential cystoscopy in about 1-2 week. Contact information: [address]  [address] [place] [address] [phone]                Discharge Exam: Fredricka Weights   08/31/24 2030 08/31/24 2230 09/01/24 1647  Weight: 44.6 kg 44.6 kg 41.2 kg   General exam: Alert, awake, oriented x 3; in no acute distress.  Reports feeling better overall. Respiratory system: Good saturation on chronic supplementation; no using accessory muscles.  Good air movement bilaterally. Cardiovascular system: Rate controlled, no rubs, no gallops, no JVD. Gastrointestinal system: Abdomen is nondistended, soft and nontender. No organomegaly or masses felt. Normal bowel sounds heard. Central nervous system: No new focal neurological deficits. Extremities: No cyanosis or clubbing; right AKA appreciated on exam. Skin: No rashes, lesions or ulcers Psychiatry: Judgement and insight appear normal. Mood & affect appropriate.   Condition at discharge: Stable and improved.  The results of significant diagnostics from this hospitalization (including imaging, microbiology, ancillary and laboratory) are listed below for reference.   Imaging Studies: CT Renal Stone Study Result Date: 09/01/2024 EXAM: CT UROGRAM 09/01/2024  03:10:35 AM TECHNIQUE: CT of the abdomen and pelvis was performed without IV contrast. Automated exposure control, iterative reconstruction, and/or weight based adjustment of the mA/kV was utilized to reduce the radiation dose to as low as reasonably achievable. COMPARISON: 08/26/2022 CLINICAL HISTORY: Abdominal/flank pain, stone suspected. Patient from home for blood in urine with blood clots that started today. Reports painful urination. FINDINGS: LOWER CHEST: No acute abnormality. LIVER: The liver is unremarkable. GALLBLADDER AND BILE DUCTS: Cholelithiasis without evidence of acute cholecystitis. SPLEEN: Markedly enlarged spleen measuring 19.1 cm in craniocaudal dimension. This has increased from 08/26/22, when it measured 17.0 cm. PANCREAS: No acute abnormality. ADRENAL GLANDS: No acute abnormality. KIDNEYS, URETERS AND BLADDER: No urinary calculi or hydronephrosis. Vascular calcifications in the bilateral renal arteries. Mild perivesical fat stranding. Correlate for urinalysis for cystitis. 1.3 x 1.3 cm mural nodule in the posterior left bladder (series 2 image 74). This is incompletely evaluated without IV contrast. In the setting of hematuria, this could represent a blood clot.  However bladder malignancy is not excluded. Cystoscopy is recommended to further evaluate. GI AND BOWEL: Stool ball in the rectum. There is no bowel obstruction. PERITONEUM AND RETROPERITONEUM: No ascites. No free air. VASCULATURE: Advanced aortic atherosclerotic calcification. LYMPH NODES: No lymphadenopathy. REPRODUCTIVE ORGANS: No acute abnormality. BONES AND SOFT TISSUES: No acute osseous abnormality. No focal soft tissue abnormality. IMPRESSION: 1. 1.3 cm mural nodule in the posterior left bladder, incompletely evaluated without IV contrast. Differential includes blood clot versus malignancy. Cystoscopy is recommended. 2. Mild perivesical fat stranding, correlate for urinalysis for cystitis. 3. Markedly enlarged spleen measuring  19.1 cm, increased from 17.0 cm on 08/26/22. 4. Fecal ball in the rectum. Correlate for constipation. Electronically signed by: Norman Gatlin MD 09/01/2024 03:43 AM EDT RP Workstation: [id number]   DG Chest 2 View Result Date: 08/24/2024 CLINICAL DATA:  Thick yellow sputum. EXAM: CHEST - 2 VIEW COMPARISON:  Portable chest 02/09/2024. FINDINGS: Stable mild cardiomegaly. The mitral ring is heavily calcified. There is patchy calcification of the thoracic aorta. The mediastinum is normally outlined. No vascular congestion is seen. The lungs are clear. The sulci are sharp. There was previously interstitial edema which has cleared. Thoracic cage is intact with osteopenia and thoracic kyphosis. IMPRESSION: No evidence of acute chest disease. Stable mild cardiomegaly. Aortic atherosclerosis. Electronically Signed   By: Francis Quam M.D.   On: 08/24/2024 01:32    Microbiology: Results for orders placed or performed during the hospital encounter of 08/31/24  Urine Culture     Status: Abnormal   Collection Time: 08/31/24 11:37 PM   Specimen: Urine, Clean Catch  Result Value Ref Range Status   Specimen Description   Final    URINE, CLEAN CATCH Performed at [facility], [address], [place], [place] [address]    Special Requests   Final    NONE Performed at [facility], [address] [place], [place] [address]    Culture (A)  Final    <10,000 COLONIES/mL INSIGNIFICANT GROWTH Performed at [facility], [address] [place], [place] [address]    Report Status 09/02/2024 FINAL  Final  Blood culture (routine x 2)     Status: None (Preliminary result)   Collection Time: 09/01/24  2:56 AM   Specimen: BLOOD  Result Value Ref Range Status   Specimen Description BLOOD LEFT ANTECUBITAL  Final   Special Requests   Final    BOTTLES DRAWN AEROBIC AND ANAEROBIC Blood Culture adequate volume   Culture   Final    NO GROWTH 2 DAYS Performed at [facility], [address] [place], [place]  [address]    Report Status PENDING  Incomplete  Blood culture (routine x 2)     Status: None (Preliminary result)   Collection Time: 09/01/24  3:41 AM   Specimen: BLOOD  Result Value Ref Range Status   Specimen Description BLOOD BLOOD RIGHT ARM  Final   Special Requests   Final    BOTTLES DRAWN AEROBIC AND ANAEROBIC Blood Culture adequate volume   Culture   Final    NO GROWTH 2 DAYS Performed at [facility], [address] [place], [place] [address]    Report Status PENDING  Incomplete   *Note: Due to a large number of results and/or encounters for the requested time period, some results have not been displayed. A complete set of results can be found in Results Review.    Labs: CBC: Recent Labs  Lab 09/01/24 0058 09/01/24 0741 09/01/24 1612 09/01/24 2339 09/02/24  9083 09/03/24 0435  WBC 25.8*  --   --   --  24.6* 25.0*  NEUTROABS 21.8*  --   --   --   --   --   HGB 10.3* 9.9* 9.5* 9.4* 9.7* 9.0*  HCT 37.4 35.5* 34.5* 34.1* 35.4* 32.6*  MCV 70.3*  --   --   --  72.2* 71.6*  PLT 423*  --   --   --  353 340   Basic Metabolic Panel: Recent Labs  Lab 09/01/24 0058 09/01/24 1612 09/02/24 0916 09/03/24 0435  NA 138  --  137 133*  K 5.3* 4.6 3.9 4.3  CL 111  --  112* 108  CO2 16*  --  18* 16*  GLUCOSE 87  --  107* 89  BUN 65*  --  44* 39*  CREATININE 1.72*  --  1.24* 1.29*  CALCIUM  7.4*  --  7.6* 7.7*  MG  --   --   --  1.3*   Liver Function Tests: Recent Labs  Lab 09/01/24 0058  AST 39  ALT 33  ALKPHOS 84  BILITOT 0.7  PROT 6.1*  ALBUMIN  2.9*   CBG: No results for input(s): GLUCAP in the last 168 hours.  Discharge time spent:  35 minutes.  Signed: Eric Nunnery, MD [facility] 09/03/2024

## 2024-09-06 ENCOUNTER — Encounter: Payer: Self-pay | Admitting: Family Medicine

## 2024-09-06 ENCOUNTER — Encounter: Payer: Self-pay | Admitting: Pulmonary Disease

## 2024-09-06 ENCOUNTER — Ambulatory Visit: Admitting: Pulmonary Disease

## 2024-09-06 LAB — CULTURE, BLOOD (ROUTINE X 2)
Culture: NO GROWTH
Culture: NO GROWTH
Special Requests: ADEQUATE
Special Requests: ADEQUATE

## 2024-09-07 ENCOUNTER — Emergency Department (HOSPITAL_COMMUNITY)
Admission: EM | Admit: 2024-09-07 | Discharge: 2024-09-07 | Disposition: A | Discharge status: Home / Self Care | Attending: Emergency Medicine | Admitting: Emergency Medicine

## 2024-09-07 ENCOUNTER — Other Ambulatory Visit: Payer: Self-pay

## 2024-09-07 ENCOUNTER — Telehealth: Payer: Self-pay

## 2024-09-07 ENCOUNTER — Emergency Department (HOSPITAL_COMMUNITY)

## 2024-09-07 DIAGNOSIS — D72829 Elevated white blood cell count, unspecified: Secondary | ICD-10-CM | POA: Diagnosis not present

## 2024-09-07 DIAGNOSIS — R079 Chest pain, unspecified: Secondary | ICD-10-CM | POA: Insufficient documentation

## 2024-09-07 LAB — COMPREHENSIVE METABOLIC PANEL WITH GFR
ALT: 29 U/L (ref 0–44)
AST: 31 U/L (ref 15–41)
Albumin: 2.6 g/dL — ABNORMAL LOW (ref 3.5–5.0)
Alkaline Phosphatase: 79 U/L (ref 38–126)
Anion gap: 8 (ref 5–15)
BUN: 41 mg/dL — ABNORMAL HIGH (ref 8–23)
CO2: 15 mmol/L — ABNORMAL LOW (ref 22–32)
Calcium: 7.3 mg/dL — ABNORMAL LOW (ref 8.9–10.3)
Chloride: 114 mmol/L — ABNORMAL HIGH (ref 98–111)
Creatinine, Ser: 1.33 mg/dL — ABNORMAL HIGH (ref 0.44–1.00)
GFR, Estimated: 42 mL/min — ABNORMAL LOW (ref >60)
Glucose, Bld: 183 mg/dL — ABNORMAL HIGH (ref 70–99)
Potassium: 4.1 mmol/L (ref 3.5–5.1)
Sodium: 137 mmol/L (ref 135–145)
Total Bilirubin: 0.5 mg/dL (ref 0.0–1.2)
Total Protein: 5.5 g/dL — ABNORMAL LOW (ref 6.5–8.1)

## 2024-09-07 LAB — BLOOD GAS, VENOUS
Acid-base deficit: 8.6 mmol/L — ABNORMAL HIGH (ref 0.0–2.0)
Bicarbonate: 15.5 mmol/L — ABNORMAL LOW (ref 20.0–28.0)
Drawn by: 53361
O2 Saturation: 95.5 %
Patient temperature: 36.9
pCO2, Ven: 28 mmHg — ABNORMAL LOW (ref 44–60)
pH, Ven: 7.35 (ref 7.25–7.43)
pO2, Ven: 75 mmHg — ABNORMAL HIGH (ref 32–45)

## 2024-09-07 LAB — CBC WITH DIFFERENTIAL/PLATELET
Abs Immature Granulocytes: 0.67 K/uL — ABNORMAL HIGH (ref 0.00–0.07)
Basophils Absolute: 0.2 K/uL — ABNORMAL HIGH (ref 0.0–0.1)
Basophils Relative: 1 %
Eosinophils Absolute: 0.8 K/uL — ABNORMAL HIGH (ref 0.0–0.5)
Eosinophils Relative: 4 %
HCT: 31.4 % — ABNORMAL LOW (ref 36.0–46.0)
Hemoglobin: 8.7 g/dL — ABNORMAL LOW (ref 12.0–15.0)
Immature Granulocytes: 3 %
Lymphocytes Relative: 5 %
Lymphs Abs: 1 K/uL (ref 0.7–4.0)
MCH: 19.5 pg — ABNORMAL LOW (ref 26.0–34.0)
MCHC: 27.7 g/dL — ABNORMAL LOW (ref 30.0–36.0)
MCV: 70.4 fL — ABNORMAL LOW (ref 80.0–100.0)
Monocytes Absolute: 0.4 K/uL (ref 0.1–1.0)
Monocytes Relative: 2 %
Neutro Abs: 18.4 K/uL — ABNORMAL HIGH (ref 1.7–7.7)
Neutrophils Relative %: 85 %
Platelets: 446 K/uL — ABNORMAL HIGH (ref 150–400)
RBC: 4.46 MIL/uL (ref 3.87–5.11)
RDW: 25.4 % — ABNORMAL HIGH (ref 11.5–15.5)
WBC: 21.5 K/uL — ABNORMAL HIGH (ref 4.0–10.5)
nRBC: 0.1 % (ref 0.0–0.2)

## 2024-09-07 LAB — TROPONIN I (HIGH SENSITIVITY)
Troponin I (High Sensitivity): 46 ng/L — ABNORMAL HIGH (ref <18)
Troponin I (High Sensitivity): 54 ng/L — ABNORMAL HIGH (ref <18)

## 2024-09-07 MED ORDER — FUROSEMIDE 10 MG/ML IJ SOLN
40.0000 mg | Freq: Once | INTRAMUSCULAR | Status: AC
Start: 2024-09-07 — End: 2024-09-07
  Administered 2024-09-07: 40 mg via INTRAVENOUS
  Filled 2024-09-07: qty 4

## 2024-09-07 NOTE — ED Provider Notes (Signed)
 Emergency Medicine Observation Re-evaluation Note  [name] is a 73 y.o. female, seen on rounds today.  Pt initially presented to the ED for complaints of Chest Pain Currently, the patient is in no distress on oxygen.  Physical Exam  BP (!) 159/75   Pulse 82   Temp 98.4 F (36.9 C) (Oral)   Resp 16   SpO2 98%  Physical Exam General: No distress Cardiac: Unremarkable heart rate Lungs: No increased work of breathing Psych: No agitation  ED Course / MDM  EKG:EKG Interpretation Date/Time:  Thursday September 07 2024 00:33:09 EDT Ventricular Rate:  100 PR Interval:  128 QRS Duration:  85 QT Interval:  361 QTC Calculation: 466 R Axis:   22  Text Interpretation: Sinus tachycardia Probable left atrial enlargement Borderline repolarization abnormality Confirmed by Jerral Meth [phone]) on 09/07/2024 4:48:15 AM  I have reviewed the labs performed to date as well as medications administered while in observation.  Recent changes in the last 24 hours include patient is out of home oxygen, needs to have an oxygen canister delivered, transition of care is working on this..  Plan  Current plan is for discharge once oxygen set up is complete and reliable.    Cleotilde Rogue, MD 09/07/24 [phone]

## 2024-09-07 NOTE — ED Notes (Signed)
 Pt able to move side to side. Had moved onto her right side caused her purwick to fall out of place. Was found urinated on bed through sheets and clothing down her back and legs. Pt provide with bed bath. Washed with [employer]. Provided with clean gown, brief, new purwick, and full set of bed sheets. Purwick secured with brief. Pt repositioned in bed. Denies any additional needs.

## 2024-09-07 NOTE — ED Triage Notes (Signed)
 Rcems from home. Cc of chest pain that started after she was coughing.  Ems gave 1ntg and 324 aspitin.   20g l ac.  Placed on 2l [place] for comfort.   178cbg
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
## 2024-09-07 NOTE — ED Notes (Signed)
 CSW reached out to patient nurse to see if oxygen was delivered since no one returned CSW call back from [place] about oxygen. CSW signing off.

## 2024-09-07 NOTE — ED Notes (Signed)
 SATURATION QUALIFICATIONS: (This note is used to comply with regulatory documentation for home oxygen)  Patient Saturations on Room Air at Rest = 86%  Patient Saturations on Room Air while Ambulating = 80%  Patient Saturations on 2L Liters of oxygen while Ambulating = 92%  Please briefly explain why patient needs home oxygen: __chronic hypoxica___   Stacey Rogue, MD 09/07/24 1025

## 2024-09-07 NOTE — ED Provider Notes (Signed)
 [place] [facility] DEPARTMENT AT [facility] Provider Note   CSN: [id number] Arrival date & time: 09/07/24  0018     History Chief Complaint  Patient presents with   Chest Pain    HPI Miss Winslow is a 73 year old female presenting with a chief complaint of chest pain. She states that she's been having pain for approximately the past 72 hours since her discharge. She was discharged on home oxygen but unfortunately, she never received a refill and her canister stopped working. EMS was called out due to shortness of breath. Patient found hypoxic. She is 2 L since this recent discharge. She called the company, but they stated that they had no further orders for her.    She endorses worsening cough, weakness, fatigue since her discharge.  Ongoing hematuria since discharge.  She has been referred to urology in the outpatient setting.  Patient's recorded medical, surgical, social, medication list and allergies were reviewed in the Snapshot window as part of the initial history.   Review of Systems   Review of Systems  Constitutional:  Positive for fatigue. Negative for chills and fever.  HENT:  Negative for ear pain and sore throat.   Eyes:  Negative for pain and visual disturbance.  Respiratory:  Positive for shortness of breath. Negative for cough.   Cardiovascular:  Negative for chest pain and palpitations.  Gastrointestinal:  Negative for abdominal pain and vomiting.  Genitourinary:  Positive for hematuria. Negative for dysuria.  Musculoskeletal:  Negative for arthralgias and back pain.  Skin:  Negative for color change and rash.  Neurological:  Negative for seizures and syncope.  All other systems reviewed and are negative.   Physical Exam Updated Vital Signs BP (!) 169/76   Pulse 92   Temp 98.4 F (36.9 C) (Oral)   Resp 17   SpO2 95%  Physical Exam Vitals and nursing note reviewed.  Constitutional:      General: She is not in acute distress.    Appearance:  She is well-developed.  HENT:     Head: Normocephalic and atraumatic.  Eyes:     Conjunctiva/sclera: Conjunctivae normal.  Cardiovascular:     Rate and Rhythm: Normal rate and regular rhythm.     Heart sounds: No murmur heard. Pulmonary:     Effort: Pulmonary effort is normal. No respiratory distress.     Breath sounds: Normal breath sounds.  Abdominal:     General: There is no distension.     Palpations: Abdomen is soft.     Tenderness: There is no abdominal tenderness. There is no right CVA tenderness or left CVA tenderness.  Musculoskeletal:        General: No swelling or tenderness. Normal range of motion.     Cervical back: Neck supple.  Skin:    General: Skin is warm and dry.  Neurological:     General: No focal deficit present.     Mental Status: She is alert and oriented to person, place, and time. Mental status is at baseline.     Cranial Nerves: No cranial nerve deficit.      ED Course/ Medical Decision Making/ A&P    Procedures Procedures   Medications Ordered in ED Medications  furosemide  (LASIX ) injection 40 mg (40 mg Intravenous Given 09/07/24 0200)   Medical Decision Making: SHIRLE PROVENCAL is a 73 y.o. female who presented to the ED today with chest pain, detailed above.    Patient placed on continuous vitals and telemetry monitoring while  in ED which was reviewed periodically.  Complete initial physical exam performed, notably the patient was HDS in NAD.   Reviewed and confirmed nursing documentation for past medical history, family history, social history.    Initial Assessment: With the patient's presentation of left-sided chest pain, most likely diagnosis is musculoskeletal chest pain versus GERD, although ACS remains on the differential. Other diagnoses were considered including (but not limited to) pulmonary embolism, community-acquired pneumonia, aortic dissection, pneumothorax, underlying bony abnormality, anemia. These are considered less likely due  to history of present illness and physical exam findings.    This is most consistent with an acute life/limb threatening illness complicated by underlying chronic conditions.   Initial Plan: EKG and serial troponin to evaluate for cardiac pathology. Evaluate for dissection, bony abnormality, or pneumonia with chest x-ray and screening laboratory evaluation including CBC, BMP   Initial Study Results: EKG was reviewed independently. Rate, rhythm, axis, intervals all examined and without medically relevant abnormality. ST segments without concerns for elevations.    Laboratory  Delta troponin demonstrated NAA   CBC and BMP without obvious metabolic or inflammatory abnormalities requiring further evaluation   Radiology  DG Chest Portable 1 View Result Date: 09/07/2024 CLINICAL DATA:  Sob EXAM: PORTABLE CHEST 1 VIEW COMPARISON:  Chest x-ray 08/24/2024, CT chest 01/30/2024 FINDINGS: The heart and mediastinal contours are unchanged. Atherosclerotic plaque. Mitral annular calcification. No focal consolidation. Increased interstitial markings . No pleural effusion. No pneumothorax. No acute osseous abnormality. IMPRESSION: 1. Pulmonary edema. 2.  Aortic Atherosclerosis (ICD10-I70.0). Electronically Signed   By: Morgane  Naveau M.D.   On: 09/07/2024 00:57   CT Renal Stone Study Result Date: 09/01/2024 EXAM: CT UROGRAM 09/01/2024 03:10:35 AM TECHNIQUE: CT of the abdomen and pelvis was performed without IV contrast. Automated exposure control, iterative reconstruction, and/or weight based adjustment of the mA/kV was utilized to reduce the radiation dose to as low as reasonably achievable. COMPARISON: 08/26/2022 CLINICAL HISTORY: Abdominal/flank pain, stone suspected. Patient from home for blood in urine with blood clots that started today. Reports painful urination. FINDINGS: LOWER CHEST: No acute abnormality. LIVER: The liver is unremarkable. GALLBLADDER AND BILE DUCTS: Cholelithiasis without evidence of  acute cholecystitis. SPLEEN: Markedly enlarged spleen measuring 19.1 cm in craniocaudal dimension. This has increased from 08/26/22, when it measured 17.0 cm. PANCREAS: No acute abnormality. ADRENAL GLANDS: No acute abnormality. KIDNEYS, URETERS AND BLADDER: No urinary calculi or hydronephrosis. Vascular calcifications in the bilateral renal arteries. Mild perivesical fat stranding. Correlate for urinalysis for cystitis. 1.3 x 1.3 cm mural nodule in the posterior left bladder (series 2 image 74). This is incompletely evaluated without IV contrast. In the setting of hematuria, this could represent a blood clot. However bladder malignancy is not excluded. Cystoscopy is recommended to further evaluate. GI AND BOWEL: Stool ball in the rectum. There is no bowel obstruction. PERITONEUM AND RETROPERITONEUM: No ascites. No free air. VASCULATURE: Advanced aortic atherosclerotic calcification. LYMPH NODES: No lymphadenopathy. REPRODUCTIVE ORGANS: No acute abnormality. BONES AND SOFT TISSUES: No acute osseous abnormality. No focal soft tissue abnormality. IMPRESSION: 1. 1.3 cm mural nodule in the posterior left bladder, incompletely evaluated without IV contrast. Differential includes blood clot versus malignancy. Cystoscopy is recommended. 2. Mild perivesical fat stranding, correlate for urinalysis for cystitis. 3. Markedly enlarged spleen measuring 19.1 cm, increased from 17.0 cm on 08/26/22. 4. Fecal ball in the rectum. Correlate for constipation. Electronically signed by: Norman Gatlin MD 09/01/2024 03:43 AM EDT RP Workstation: [id number]   DG Chest 2 View Result Date: 08/24/2024  CLINICAL DATA:  Thick yellow sputum. EXAM: CHEST - 2 VIEW COMPARISON:  Portable chest 02/09/2024. FINDINGS: Stable mild cardiomegaly. The mitral ring is heavily calcified. There is patchy calcification of the thoracic aorta. The mediastinum is normally outlined. No vascular congestion is seen. The lungs are clear. The sulci are sharp. There was  previously interstitial edema which has cleared. Thoracic cage is intact with osteopenia and thoracic kyphosis. IMPRESSION: No evidence of acute chest disease. Stable mild cardiomegaly. Aortic atherosclerosis. Electronically Signed   By: Francis Quam M.D.   On: 08/24/2024 01:32    Final Assessment and Plan: Ms. Welborn is a chronically ill 73 year old female.  Ultimately her workup showed only mild cardiomegaly which has developed since her last visit.  She does have trace edema of the left leg which is new since I saw her last week.  Likely volume overload from her recent admission for a UTI.  White count continues to downtrend since her admission and VBG is inconsistent with a COPD exacerbation. Considered pulmonary embolism, however the pain is resolved on reassessment which seems atypical with PE.  Additionally, patient does have serious renal disease and has had multiple CT angiography's of her chest for similar symptoms all of which have been negative for pulmonary embolism.  Additionally, she is already anticoagulated which makes pulmonary embolism seem much less likely.  I discussed this with the patient and she was in agreement with the risk-benefit profile favoring not getting a repeat PE study. I feel most of her shortness of breath symptoms were secondary to running out of her home oxygen as she is very comfortable back on her 2 L nasal cannula. Consultation to social work to assist in reestablishing her home oxygen and ensure safe disposition back home with family.   Clinical Impression:  1. Chest pain, unspecified type      Data Unavailable   Final Clinical Impression(s) / ED Diagnoses Final diagnoses:  Chest pain, unspecified type    Rx / DC Orders ED Discharge Orders     None         Jerral Meth, MD 09/07/24 [phone]

## 2024-09-07 NOTE — ED Notes (Deleted)
 SATURATION QUALIFICATIONS: (This note is used to comply with regulatory documentation for home oxygen)  Patient Saturations on Room Air at Rest = ----%  Patient Saturations on Room Air while Ambulating = ----%  Patient Saturations on 2L Liters of oxygen while Ambulating = ----%  Please briefly explain why patient needs home oxygen: _____

## 2024-09-07 NOTE — Telephone Encounter (Signed)
 Message sent to me by [facility] front office advising that patient had called and left a voicemail advising she needed to schedule an appt after being discharged from the ED today. I checked to see if a referral had been place prior to discharge, it had not. I checked patients chart to determine dx for urology referral and could not find one. I contact contacted the patient and left a voicemail requested she call me to determine scheduling for urology and provided my direct contact number.

## 2024-09-08 ENCOUNTER — Telehealth: Payer: Self-pay | Admitting: Family Medicine

## 2024-09-08 NOTE — Telephone Encounter (Signed)
 Copied from CRM [id number]. Topic: General - Other >> Sep 08, 2024 10:54 AM Avram MATSU wrote: Reason for CRM: Ellouise calling from [facility] and stated pt is not sure 100% of the medication she's taking and does not want review her medication allergy. Pt does not think she can remember all of them and her daughter handles of her medication. Ellouise wanted to let the provider know no callback needed.

## 2024-09-12 NOTE — Progress Notes (Signed)
 Chief Complaint: Blood in urine  History of Present Illness:  [name] is a 73 y.o. female presenting for painful urination as well as gross hematuria for the past 3 weeks or so.  She started passing blood in her urine on the 2nd or 3rd of this month.  She has had a few emergency room visits.  She has had 2 negative urine cultures.  She has been on antibiotics, however.  CT was performed showing a small left-sided mural mass of the bladder.  She is on both Eliquis  and aspirin .  She has a history of polycythemia vera as well as atrial fibrillation, peripheral vascular disease and multiple other medical comorbidities.  She has had a right BKA.   Past Medical History:  Past Medical History:  Diagnosis Date   Acute blood loss anemia 05/04/2020   Acute ischemic stroke (HCC)    Acute respiratory failure with hypoxia (HCC) 01/29/2024   AKI (acute kidney injury)    Allergy ?, Don't remember   Arthritis    fingers, some in my knees (01/28/2016)   Atrial fibrillation with RVR, hx of PAF previously 04/27/2013   Paroxysmal atrial fibrillation     Formatting of this note might be different from the original.  Overview:   Paroxysmal atrial fibrillation     Last Assessment & Plan:   History of PAF maintaining sinus rhythm on flecainide  and Eliquis  oral anti-coagulation     Blood transfusion without reported diagnosis    During surgery   CAD (coronary artery disease), per cath 04/27/13, non obstructive disease, EF 65-70% 05/11/2013   Carotid disease, bilateral 09/20/2013   Lt carotid 50-69% stentosis, less on rt   Cataract    Don't remember   Cellulitis of left leg 01/16/2022   Closed fracture of left tibial plateau 10/23/2018   Depression Don't remember   Dysrhythmia    Elevated troponin 05/04/2020   Encounter for support and coordination of transition of care 11/11/2021   Fatty liver    Fracture of ankle 10/23/2018   GERD (gastroesophageal reflux disease)    Glaucoma    H/O  cardiovascular stress test 12/27/2009   negative for ischemia   Headache    occasional since eye pressure regulated (01/28/2016)   Hospital discharge follow-up 10/15/2021   Hyperlipidemia    Hypertension    Hypertensive crisis 01/08/2016   Formatting of this note might be different from the original.  hypertension     Last Assessment & Plan:   BP at goal and significantly improved from previous visit. Patient tolerating valsartan  and chlorthalidone  very well. Reports 1 episode of symptomatic hypotension that resolved after few hours. Denies falls or increase fatigue. Will continue current therapy without changes and follow up as nee   Hypertensive emergency 11/28/2022   Leukocytosis 07/15/2015   Migraine    stopped w/laser holes to relieve pressure in my eyes; had them 3-4 times/wk before taht (01/28/2016)   Mitral regurgitation,2+ by cath 05/11/2013   NSTEMI (non-ST elevated myocardial infarction) (HCC) 04/27/2013   Osteoporosis IDK   Pain    BOTH KNEES - PT HAS TORN MENISCUS LEFT KNEE   Pain in both wrists 09/03/2021   Pain in left knee 10/03/2018   Paroxysmal atrial fibrillation (HCC)    hx of a fib on ASA  AND ELIQUIS     Pelvic fracture (HCC) 09/09/2018   Polycythemia vera (HCC) 08/20/2015   JAK-2 positive 07/19/15   Prosthesis adjustment 01/13/2019   PVD (peripheral vascular disease) with  claudication 07/21/2006   previous L ext iliac stent and rt SFA stent, known occluded Lt SFA   S/P cardiac cath 04/27/2013   NON OBSTRUCTIVE DISEASE, 2+ mr, ef 65-70%   Sacral fracture, closed (HCC) 05/04/2020   Statin intolerance    Stroke (HCC) 12/21/2004   SWELLING ALL OVER AND RT SIDE OF FACE DRAWN AND SPEECH SLURRED AND NUMBNESS ON RIGHT SIDE-- ALL RESOLVED   TIA (transient ischemic attack)    Trauma 05/04/2020    Past Surgical History:  Past Surgical History:  Procedure Laterality Date   ABDOMINAL HYSTERECTOMY  1977   After childbirth   AMPUTATION Right 05/21/2017   Procedure:  AMPUTATION ABOVE KNEE;  Surgeon: Oris Krystal FALCON, MD;  Location: [facility] OR;  Service: Vascular;  Laterality: Right;   CARDIAC CATHETERIZATION  04/27/2013   non occlusive disease with mild to mod. calcified lesions in ostial LM and proximal LAD and moderate prox. RC AND DISTAL AV GROOVE LCX, ef   CATARACT EXTRACTION W/PHACO Right 03/21/2015   Procedure: CATARACT EXTRACTION PHACO AND INTRAOCULAR LENS PLACEMENT RIGHT EYE;  Surgeon: Lynwood Hermann, MD;  Location: [facility] ORS;  Service: Ophthalmology;  Laterality: Right;  CDE:5.10   CATARACT EXTRACTION W/PHACO Left 05/16/2015   Procedure: CATARACT EXTRACTION PHACO AND INTRAOCULAR LENS PLACEMENT (IOC);  Surgeon: Lynwood Hermann, MD;  Location: [facility] ORS;  Service: Ophthalmology;  Laterality: Left;  CDE 5.57   CESAREAN SECTION  12/22/1975   COLONOSCOPY N/A 09/11/2016   Procedure: COLONOSCOPY;  Surgeon: Claudis RAYMOND Rivet, MD;  Location: [facility];  Service: Endoscopy;  Laterality: N/A;  730-moved to 1:00 Ann notified pt   EMBOLECTOMY Right 02/01/2016   Procedure: Thrombectomy of Right Femoral-Popliteal Bypass Graft; Endarterectomy of Right Below Knee Popliteal Artery and Tibial Peroneal Trunk with Bovine Pericardium Patch Angioplasty.;  Surgeon: Lonni GORMAN Blade, MD;  Location: [facility] OR;  Service: Vascular;  Laterality: Right;   EYE SURGERY     FEMORAL-POPLITEAL BYPASS GRAFT Right 01/31/2016   Procedure: BYPASS GRAFT RIGHT COMMON  FEMORALTO BELOW KNEE POPLITEAL ARTERY BYPASS GRAFT USING PROPATEN GORTEX GRAFT;  Surgeon: Lynwood JONETTA Collum, MD;  Location: [facility] OR;  Service: Vascular;  Laterality: Right;   FEMORAL-POPLITEAL BYPASS GRAFT Right 01/09/2017   Procedure: THROMBECTOMY OF RIGHT FEMORAL-POPLITEAL  ARTERY BYPASS GRAFT; THROMBECTOMY RIGHT  TIBIAL VESSELS;  Surgeon: Carlin FORBES Haddock, MD;  Location: [facility] OR;  Service: Vascular;  Laterality: Right;   FEMORAL-POPLITEAL BYPASS GRAFT Right 05/17/2017   Procedure: RIGHT FEMORAL-TIBIAL PERONEAL TRUNK ARTERY BYPASS GRAFT USING  37mmX80cm PROPATEN GRAFT WITH REMOVABLE RING;  Surgeon: Oris Krystal FALCON, MD;  Location: [facility] OR;  Service: Vascular;  Laterality: Right;   FRACTURE SURGERY     ILIAC ARTERY STENT  07/21/2006   external iliac and Rt SFA stent 07/2006 AND THE LEFT WAS IN 2006   KNEE ARTHROSCOPY WITH MEDIAL MENISECTOMY Left 03/27/2014   Procedure: left knee arthorscopy with medial chondraplasty of the medial femoral and patella, medial microfracture technique of medial femoral condyl;  Surgeon: Tanda DELENA Heading, MD;  Location: [facility] ORS;  Service: Orthopedics;  Laterality: Left;   LEFT HEART CATHETERIZATION WITH CORONARY ANGIOGRAM Right 04/27/2013   Procedure: LEFT HEART CATHETERIZATION WITH CORONARY ANGIOGRAM;  Surgeon: Alm LELON Clay, MD;  Location: [facility] CATH LAB;  Service: Cardiovascular;  Laterality: Right;   LOWER EXTREMITY ANGIOGRAM Right 01/31/2016   Procedure: INTRAOP RIGHT LOWER EXTREMITY ANGIOGRAM;  Surgeon: Lynwood JONETTA Collum, MD;  Location: [facility] OR;  Service: Vascular;  Laterality: Right;   ORIF WRIST FRACTURE Right  12/21/1981   OVARIAN CYST REMOVAL Right    PATCH ANGIOPLASTY Right 01/31/2016   Procedure: RIGHT COMMON FEMORAL AND PROFUNDA FEMORIS ENDARECTOMY WITH PATCH ANGIOPLASTY;  Surgeon: Lynwood JONETTA Collum, MD;  Location: [facility] OR;  Service: Vascular;  Laterality: Right;   PATCH ANGIOPLASTY Right 01/09/2017   Procedure: PATCH ANGIOPLASTY RIGHT POPLITEAL ARTERY BYPASS GRAFT;  Surgeon: Carlin FORBES Haddock, MD;  Location: [facility] OR;  Service: Vascular;  Laterality: Right;   PERIPHERAL VASCULAR CATHETERIZATION N/A 06/27/2015   Procedure: Lower Extremity Angiography;  Surgeon: Dorn JINNY Lesches, MD;  Location: [facility] INVASIVE CV LAB;  Service: Cardiovascular;  Laterality: N/A;   PERIPHERAL VASCULAR CATHETERIZATION Bilateral 01/29/2016   Procedure: Lower Extremity Angiography;  Surgeon: Deatrice DELENA Cage, MD;  Location: [facility] INVASIVE CV LAB;  Service: Cardiovascular;  Laterality: Bilateral;   PERIPHERAL VASCULAR CATHETERIZATION N/A 01/29/2016    Procedure: Abdominal Aortogram;  Surgeon: Deatrice DELENA Cage, MD;  Location: [facility] INVASIVE CV LAB;  Service: Cardiovascular;  Laterality: N/A;   REFRACTIVE SURGERY Bilateral    6 in one eye; 7 in the other; to relieve pressure; not glaucoma (01/28/2016)   SFA Right 06/27/2015   overlapping Bahn covered stents   TUBAL LIGATION  12/21/1981   VEIN HARVEST Left 05/17/2017   Procedure: LEFT LEG GREATER SAPHENOUS VEIN HARVEST;  Surgeon: Oris Krystal FALCON, MD;  Location: [facility] OR;  Service: Vascular;  Laterality: Left;   VEIN REPAIR Right 01/31/2016   Procedure: RIGHT GREATER SAPHENOUS VEIN EXAMNED BUT NOT REMOVED;  Surgeon: Lynwood JONETTA Collum, MD;  Location: [facility] OR;  Service: Vascular;  Laterality: Right;    Allergies:  Allergies  Allergen Reactions   Atenolol Rash and Itching   Demerol   [Meperidine  Hcl] Itching   Demerol  [Meperidine ] Itching and Other (See Comments)    Unknown, can't remember    Gabapentin  Anxiety and Other (See Comments)    SI and HI   Inderal [Propranolol] Other (See Comments) and Itching    Doesn't remember    Prednisone Other (See Comments)    Muscle spasms   Statins Itching and Other (See Comments)    Simvastatin  - caused severe itching Pravastatin  - caused lesser tiching One type of statin? Caused stroke in 2006, and other symptoms as result   Warfarin And Related Other (See Comments)    Caused nose bleeds   Crestor [Rosuvastatin] Itching and Rash   Docosahexaenoic Acid (Dha) (Fish)     nosebleeds   Lactose Intolerance (Gi) Other (See Comments)    Bloating, gas   Lipitor [Atorvastatin ] Rash   Lovaza [Omega-3-Acid Ethyl Esters (Fish)] Other (See Comments)    nosebleeds    Penicillins Hives, Rash and Other (See Comments)    Immediate rash, facial/tongue/throat swelling, SOB or lightheadedness with hypotension Questionable high fever    Pravastatin  Itching and Rash   Simvastatin  Itching, Rash and Other (See Comments)   Trilipix [Choline Fenofibrate ] Other (See Comments)     Doesn't remember    Warfarin Other (See Comments)    nosebleeds   Hydralazine  Swelling   Effexor [Venlafaxine Hcl] Other (See Comments)    Doesn't remember    Nexium [Esomeprazole Magnesium ] Rash   Percocet [Oxycodone -Acetaminophen ] Other (See Comments)    Doesn't remember    Plavix  [Clopidogrel  Bisulfate] Rash    Family History:  Family History  Problem Relation Age of Onset   CAD Mother    Lung cancer Mother    Heart attack Mother    Miscarriages / Stillbirths Mother    Obesity Mother  Stroke Mother    Vision loss Mother    CAD Father    Heart disease Father    Heart attack Father 80       died in hes 12's with heart disease   Hearing loss Father    Healthy Sister    Liver cancer Brother    Alcohol  abuse Brother    Arthritis Sister    CAD Brother        has had CABG   CAD Brother    Cancer Maternal Aunt    Cancer Sister     Social History:  Social History   Tobacco Use   Smoking status: Former    Current packs/day: 0.00    Average packs/day: 1 pack/day for 45.0 years (45.0 ttl pk-yrs)    Types: Cigarettes    Start date: 11/12/1967    Quit date: 11/11/2012    Years since quitting: 11.8   Smokeless tobacco: Never   Tobacco comments:    quit 4 years ago.    10/27/21-Pt declines lung ca screen at this time.   Vaping Use   Vaping status: Never Used  Substance Use Topics   Alcohol  use: No    Alcohol /week: 0.0 standard drinks of alcohol    Drug use: No    Review of symptoms:  Constitutional:  Negative for unexplained weight loss, night sweats, fever, chills ENT:  Negative for nose bleeds, sinus pain, painful swallowing CV:  Negative for chest pain, shortness of breath, exercise intolerance, palpitations, loss of consciousness Resp:  Negative for cough, wheezing, shortness of breath GI:  Negative for nausea, vomiting, diarrhea, bloody stools GU:  Positives noted in HPI; otherwise negative for gross hematuria, dysuria, urinary incontinence Neuro:   Negative for seizures, poor balance, limb weakness, slurred speech Psych:  Negative for lack of energy, depression, anxiety Endocrine:  Negative for polydipsia, polyuria, symptoms of hypoglycemia (dizziness, hunger, sweating) Hematologic:  Negative for anemia, purpura, petechia, prolonged or excessive bleeding, use of anticoagulants  Allergic:  Negative for difficulty breathing or choking as a result of exposure to anything; no shellfish allergy; no allergic response (rash/itch) to materials, foods  Physical exam: There were no vitals taken for this visit. GENERAL APPEARANCE: Cachectic appearing elderly female, appearing older than her stated age. HEENT: Atraumatic, Normocephalic. NECK: Normal appearance LUNGS: Normal inspiratory and expiratory excursion HEART: Regular Rate EXTREMITIES: Moves all extremities well.  Without clubbing, cyanosis, or edema. NEUROLOGIC:  Alert and oriented x 3, normal gait, CN II-XII grossly intact.  MENTAL STATUS:  Appropriate. SKIN:  Warm, dry and intact.    Results: I have reviewed prior patient's records  I have reviewed referring/prior physicians records  I have reviewed urinalysis--grossly bloody  I have reviewed prior urine cultures  I reviewed prior imaging studies. CT images reviewed with pt MPRESSION: 1. 1.3 cm mural nodule in the posterior left bladder, incompletely evaluated without IV contrast. Differential includes blood clot versus malignancy. Cystoscopy is recommended. 2. Mild perivesical fat stranding, correlate for urinalysis for cystitis. 3. Markedly enlarged spleen measuring 19.1 cm, increased from 17.0 cm on 08/26/22. 4. Fecal ball in the rectum. Correlate for constipation. Assessment: 1.  Small left-sided bladder mass-may well be bladder tumor  2.  3-week history of gross, painful hematuria most likely secondary to the above, plus the patient is on anticoagulation   Plan: 1.  I will contact her cardiologist about her coming  off of her Eliquis  and aspirin  temporarily  2.  AZO for her bladder pain  3.  I will have her come back in 1 week for cystoscopy  4.  CBC done today.

## 2024-09-13 ENCOUNTER — Encounter: Payer: Self-pay | Admitting: Urology

## 2024-09-13 ENCOUNTER — Ambulatory Visit (INDEPENDENT_AMBULATORY_CARE_PROVIDER_SITE_OTHER): Admitting: Urology

## 2024-09-13 ENCOUNTER — Encounter: Payer: Self-pay | Admitting: Hematology

## 2024-09-13 ENCOUNTER — Ambulatory Visit: Admitting: Hematology

## 2024-09-13 ENCOUNTER — Other Ambulatory Visit

## 2024-09-13 VITALS — BP 128/69 | HR 94 | Ht 64.0 in | Wt 92.0 lb

## 2024-09-13 DIAGNOSIS — N3289 Other specified disorders of bladder: Secondary | ICD-10-CM

## 2024-09-13 DIAGNOSIS — R310 Gross hematuria: Secondary | ICD-10-CM

## 2024-09-13 LAB — BLADDER SCAN AMB NON-IMAGING: Scan Result: 110

## 2024-09-16 ENCOUNTER — Encounter (HOSPITAL_COMMUNITY): Payer: Self-pay | Admitting: Pharmacy Technician

## 2024-09-16 ENCOUNTER — Inpatient Hospital Stay (HOSPITAL_COMMUNITY)

## 2024-09-16 ENCOUNTER — Emergency Department (HOSPITAL_COMMUNITY)

## 2024-09-16 ENCOUNTER — Other Ambulatory Visit: Payer: Self-pay

## 2024-09-16 ENCOUNTER — Inpatient Hospital Stay (HOSPITAL_COMMUNITY)
Admission: EM | Admit: 2024-09-16 | Discharge: 2024-09-20 | DRG: 811 | Disposition: A | Discharge status: Home Health | Attending: Internal Medicine | Admitting: Internal Medicine

## 2024-09-16 DIAGNOSIS — D72829 Elevated white blood cell count, unspecified: Secondary | ICD-10-CM | POA: Diagnosis present

## 2024-09-16 DIAGNOSIS — R319 Hematuria, unspecified: Secondary | ICD-10-CM | POA: Diagnosis present

## 2024-09-16 DIAGNOSIS — G9349 Other encephalopathy: Secondary | ICD-10-CM | POA: Diagnosis present

## 2024-09-16 DIAGNOSIS — Z79899 Other long term (current) drug therapy: Secondary | ICD-10-CM

## 2024-09-16 DIAGNOSIS — D649 Anemia, unspecified: Principal | ICD-10-CM | POA: Diagnosis present

## 2024-09-16 DIAGNOSIS — Z888 Allergy status to other drugs, medicaments and biological substances status: Secondary | ICD-10-CM

## 2024-09-16 DIAGNOSIS — Z681 Body mass index (BMI) 19 or less, adult: Secondary | ICD-10-CM

## 2024-09-16 DIAGNOSIS — K7200 Acute and subacute hepatic failure without coma: Secondary | ICD-10-CM | POA: Diagnosis not present

## 2024-09-16 DIAGNOSIS — Z961 Presence of intraocular lens: Secondary | ICD-10-CM | POA: Diagnosis present

## 2024-09-16 DIAGNOSIS — R64 Cachexia: Secondary | ICD-10-CM | POA: Diagnosis present

## 2024-09-16 DIAGNOSIS — E43 Unspecified severe protein-calorie malnutrition: Secondary | ICD-10-CM | POA: Diagnosis present

## 2024-09-16 DIAGNOSIS — N179 Acute kidney failure, unspecified: Secondary | ICD-10-CM | POA: Diagnosis present

## 2024-09-16 DIAGNOSIS — Z87898 Personal history of other specified conditions: Secondary | ICD-10-CM

## 2024-09-16 DIAGNOSIS — E871 Hypo-osmolality and hyponatremia: Secondary | ICD-10-CM

## 2024-09-16 DIAGNOSIS — G8324 Monoplegia of upper limb affecting left nondominant side: Secondary | ICD-10-CM | POA: Diagnosis present

## 2024-09-16 DIAGNOSIS — Z880 Allergy status to penicillin: Secondary | ICD-10-CM

## 2024-09-16 DIAGNOSIS — K8000 Calculus of gallbladder with acute cholecystitis without obstruction: Secondary | ICD-10-CM | POA: Diagnosis present

## 2024-09-16 DIAGNOSIS — Z8679 Personal history of other diseases of the circulatory system: Secondary | ICD-10-CM

## 2024-09-16 DIAGNOSIS — N3289 Other specified disorders of bladder: Secondary | ICD-10-CM | POA: Diagnosis not present

## 2024-09-16 DIAGNOSIS — I252 Old myocardial infarction: Secondary | ICD-10-CM

## 2024-09-16 DIAGNOSIS — I6930 Unspecified sequelae of cerebral infarction: Secondary | ICD-10-CM

## 2024-09-16 DIAGNOSIS — I779 Disorder of arteries and arterioles, unspecified: Secondary | ICD-10-CM | POA: Diagnosis not present

## 2024-09-16 DIAGNOSIS — I480 Paroxysmal atrial fibrillation: Secondary | ICD-10-CM | POA: Diagnosis present

## 2024-09-16 DIAGNOSIS — Z7982 Long term (current) use of aspirin: Secondary | ICD-10-CM

## 2024-09-16 DIAGNOSIS — E785 Hyperlipidemia, unspecified: Secondary | ICD-10-CM | POA: Diagnosis present

## 2024-09-16 DIAGNOSIS — Z87440 Personal history of urinary (tract) infections: Secondary | ICD-10-CM

## 2024-09-16 DIAGNOSIS — I69351 Hemiplegia and hemiparesis following cerebral infarction affecting right dominant side: Secondary | ICD-10-CM | POA: Diagnosis not present

## 2024-09-16 DIAGNOSIS — N1832 Chronic kidney disease, stage 3b: Secondary | ICD-10-CM | POA: Diagnosis present

## 2024-09-16 DIAGNOSIS — D6832 Hemorrhagic disorder due to extrinsic circulating anticoagulants: Secondary | ICD-10-CM | POA: Diagnosis present

## 2024-09-16 DIAGNOSIS — R29710 NIHSS score 10: Secondary | ICD-10-CM | POA: Diagnosis present

## 2024-09-16 DIAGNOSIS — I2510 Atherosclerotic heart disease of native coronary artery without angina pectoris: Secondary | ICD-10-CM | POA: Diagnosis present

## 2024-09-16 DIAGNOSIS — Z823 Family history of stroke: Secondary | ICD-10-CM

## 2024-09-16 DIAGNOSIS — I6329 Cerebral infarction due to unspecified occlusion or stenosis of other precerebral arteries: Secondary | ICD-10-CM | POA: Diagnosis not present

## 2024-09-16 DIAGNOSIS — I69391 Dysphagia following cerebral infarction: Secondary | ICD-10-CM | POA: Diagnosis not present

## 2024-09-16 DIAGNOSIS — M319 Necrotizing vasculopathy, unspecified: Secondary | ICD-10-CM | POA: Diagnosis not present

## 2024-09-16 DIAGNOSIS — D62 Acute posthemorrhagic anemia: Principal | ICD-10-CM | POA: Diagnosis present

## 2024-09-16 DIAGNOSIS — I5032 Chronic diastolic (congestive) heart failure: Secondary | ICD-10-CM | POA: Diagnosis present

## 2024-09-16 DIAGNOSIS — I6389 Other cerebral infarction: Secondary | ICD-10-CM | POA: Diagnosis not present

## 2024-09-16 DIAGNOSIS — R29898 Other symptoms and signs involving the musculoskeletal system: Secondary | ICD-10-CM | POA: Insufficient documentation

## 2024-09-16 DIAGNOSIS — I130 Hypertensive heart and chronic kidney disease with heart failure and stage 1 through stage 4 chronic kidney disease, or unspecified chronic kidney disease: Secondary | ICD-10-CM | POA: Diagnosis present

## 2024-09-16 DIAGNOSIS — I6350 Cerebral infarction due to unspecified occlusion or stenosis of unspecified cerebral artery: Secondary | ICD-10-CM | POA: Diagnosis not present

## 2024-09-16 DIAGNOSIS — E872 Acidosis, unspecified: Secondary | ICD-10-CM | POA: Diagnosis present

## 2024-09-16 DIAGNOSIS — E860 Dehydration: Secondary | ICD-10-CM | POA: Diagnosis present

## 2024-09-16 DIAGNOSIS — K810 Acute cholecystitis: Secondary | ICD-10-CM | POA: Insufficient documentation

## 2024-09-16 DIAGNOSIS — R310 Gross hematuria: Principal | ICD-10-CM | POA: Diagnosis present

## 2024-09-16 DIAGNOSIS — R29704 NIHSS score 4: Secondary | ICD-10-CM | POA: Diagnosis not present

## 2024-09-16 DIAGNOSIS — J9611 Chronic respiratory failure with hypoxia: Secondary | ICD-10-CM | POA: Diagnosis present

## 2024-09-16 DIAGNOSIS — F411 Generalized anxiety disorder: Secondary | ICD-10-CM | POA: Diagnosis present

## 2024-09-16 DIAGNOSIS — H409 Unspecified glaucoma: Secondary | ICD-10-CM | POA: Diagnosis present

## 2024-09-16 DIAGNOSIS — Z87891 Personal history of nicotine dependence: Secondary | ICD-10-CM

## 2024-09-16 DIAGNOSIS — Z822 Family history of deafness and hearing loss: Secondary | ICD-10-CM

## 2024-09-16 DIAGNOSIS — Z7901 Long term (current) use of anticoagulants: Secondary | ICD-10-CM

## 2024-09-16 DIAGNOSIS — I739 Peripheral vascular disease, unspecified: Secondary | ICD-10-CM | POA: Diagnosis present

## 2024-09-16 DIAGNOSIS — Z89611 Acquired absence of right leg above knee: Secondary | ICD-10-CM

## 2024-09-16 DIAGNOSIS — J4520 Mild intermittent asthma, uncomplicated: Secondary | ICD-10-CM

## 2024-09-16 DIAGNOSIS — R1310 Dysphagia, unspecified: Secondary | ICD-10-CM | POA: Diagnosis not present

## 2024-09-16 DIAGNOSIS — N329 Bladder disorder, unspecified: Secondary | ICD-10-CM | POA: Diagnosis present

## 2024-09-16 DIAGNOSIS — Z8261 Family history of arthritis: Secondary | ICD-10-CM

## 2024-09-16 DIAGNOSIS — I10 Essential (primary) hypertension: Secondary | ICD-10-CM | POA: Diagnosis present

## 2024-09-16 DIAGNOSIS — I639 Cerebral infarction, unspecified: Secondary | ICD-10-CM | POA: Diagnosis not present

## 2024-09-16 DIAGNOSIS — Z9841 Cataract extraction status, right eye: Secondary | ICD-10-CM

## 2024-09-16 DIAGNOSIS — E739 Lactose intolerance, unspecified: Secondary | ICD-10-CM | POA: Diagnosis present

## 2024-09-16 DIAGNOSIS — Z7983 Long term (current) use of bisphosphonates: Secondary | ICD-10-CM

## 2024-09-16 DIAGNOSIS — E875 Hyperkalemia: Secondary | ICD-10-CM | POA: Diagnosis not present

## 2024-09-16 DIAGNOSIS — R4701 Aphasia: Secondary | ICD-10-CM | POA: Diagnosis present

## 2024-09-16 DIAGNOSIS — I708 Atherosclerosis of other arteries: Secondary | ICD-10-CM | POA: Diagnosis present

## 2024-09-16 DIAGNOSIS — R9431 Abnormal electrocardiogram [ECG] [EKG]: Secondary | ICD-10-CM | POA: Insufficient documentation

## 2024-09-16 DIAGNOSIS — R7401 Elevation of levels of liver transaminase levels: Secondary | ICD-10-CM | POA: Diagnosis not present

## 2024-09-16 DIAGNOSIS — N189 Chronic kidney disease, unspecified: Secondary | ICD-10-CM

## 2024-09-16 DIAGNOSIS — F39 Unspecified mood [affective] disorder: Secondary | ICD-10-CM | POA: Diagnosis present

## 2024-09-16 DIAGNOSIS — Z885 Allergy status to narcotic agent status: Secondary | ICD-10-CM

## 2024-09-16 DIAGNOSIS — J45909 Unspecified asthma, uncomplicated: Secondary | ICD-10-CM | POA: Insufficient documentation

## 2024-09-16 DIAGNOSIS — Z9842 Cataract extraction status, left eye: Secondary | ICD-10-CM

## 2024-09-16 DIAGNOSIS — K219 Gastro-esophageal reflux disease without esophagitis: Secondary | ICD-10-CM | POA: Diagnosis present

## 2024-09-16 DIAGNOSIS — E272 Addisonian crisis: Secondary | ICD-10-CM | POA: Insufficient documentation

## 2024-09-16 DIAGNOSIS — E8722 Chronic metabolic acidosis: Secondary | ICD-10-CM | POA: Diagnosis present

## 2024-09-16 DIAGNOSIS — I959 Hypotension, unspecified: Secondary | ICD-10-CM | POA: Diagnosis present

## 2024-09-16 DIAGNOSIS — R627 Adult failure to thrive: Secondary | ICD-10-CM | POA: Diagnosis present

## 2024-09-16 DIAGNOSIS — I771 Stricture of artery: Secondary | ICD-10-CM | POA: Insufficient documentation

## 2024-09-16 DIAGNOSIS — D75839 Thrombocytosis, unspecified: Secondary | ICD-10-CM | POA: Diagnosis not present

## 2024-09-16 DIAGNOSIS — N029 Recurrent and persistent hematuria with unspecified morphologic changes: Secondary | ICD-10-CM | POA: Diagnosis not present

## 2024-09-16 DIAGNOSIS — Z821 Family history of blindness and visual loss: Secondary | ICD-10-CM

## 2024-09-16 DIAGNOSIS — Z8249 Family history of ischemic heart disease and other diseases of the circulatory system: Secondary | ICD-10-CM

## 2024-09-16 DIAGNOSIS — E876 Hypokalemia: Secondary | ICD-10-CM | POA: Diagnosis present

## 2024-09-16 LAB — URINALYSIS, ROUTINE W REFLEX MICROSCOPIC
Bilirubin Urine: NEGATIVE
Glucose, UA: NEGATIVE mg/dL
Ketones, ur: NEGATIVE mg/dL
Nitrite: NEGATIVE
Protein, ur: 100 mg/dL — AB
Specific Gravity, Urine: 1.017 (ref 1.005–1.030)
WBC, UA: >50 WBC/hpf (ref 0–5)
pH: 5 (ref 5.0–8.0)

## 2024-09-16 LAB — COMPREHENSIVE METABOLIC PANEL WITH GFR
ALT: 164 U/L — ABNORMAL HIGH (ref 0–44)
AST: 265 U/L — ABNORMAL HIGH (ref 15–41)
Albumin: 2.5 g/dL — ABNORMAL LOW (ref 3.5–5.0)
Alkaline Phosphatase: 82 U/L (ref 38–126)
Anion gap: 15 (ref 5–15)
BUN: 76 mg/dL — ABNORMAL HIGH (ref 8–23)
CO2: 11 mmol/L — ABNORMAL LOW (ref 22–32)
Calcium: 7.5 mg/dL — ABNORMAL LOW (ref 8.9–10.3)
Chloride: 100 mmol/L (ref 98–111)
Creatinine, Ser: 2.65 mg/dL — ABNORMAL HIGH (ref 0.44–1.00)
GFR, Estimated: 18 mL/min — ABNORMAL LOW (ref >60)
Glucose, Bld: 90 mg/dL (ref 70–99)
Potassium: 5.8 mmol/L — ABNORMAL HIGH (ref 3.5–5.1)
Sodium: 126 mmol/L — ABNORMAL LOW (ref 135–145)
Total Bilirubin: 0.8 mg/dL (ref 0.0–1.2)
Total Protein: 5.1 g/dL — ABNORMAL LOW (ref 6.5–8.1)

## 2024-09-16 LAB — CBC
HCT: 16.5 % — ABNORMAL LOW (ref 36.0–46.0)
Hemoglobin: 4.6 g/dL — CL (ref 12.0–15.0)
MCH: 19.4 pg — ABNORMAL LOW (ref 26.0–34.0)
MCHC: 27.9 g/dL — ABNORMAL LOW (ref 30.0–36.0)
MCV: 69.6 fL — ABNORMAL LOW (ref 80.0–100.0)
Platelets: 902 K/uL (ref 150–400)
RBC: 2.37 MIL/uL — ABNORMAL LOW (ref 3.87–5.11)
RDW: 23.9 % — ABNORMAL HIGH (ref 11.5–15.5)
WBC: 54.1 K/uL (ref 4.0–10.5)
nRBC: 0.8 % — ABNORMAL HIGH (ref 0.0–0.2)

## 2024-09-16 LAB — CBG MONITORING, ED
Glucose-Capillary: 79 mg/dL (ref 70–99)
Glucose-Capillary: 76 mg/dL (ref 70–99)
Glucose-Capillary: 89 mg/dL (ref 70–99)

## 2024-09-16 LAB — OSMOLALITY: Osmolality: 297 mosm/kg — ABNORMAL HIGH (ref 275–295)

## 2024-09-16 LAB — APTT: aPTT: 48 s — ABNORMAL HIGH (ref 24–36)

## 2024-09-16 LAB — T4, FREE: Free T4: 0.8 ng/dL (ref 0.61–1.12)

## 2024-09-16 LAB — PREPARE RBC (CROSSMATCH)

## 2024-09-16 LAB — CREATININE, URINE, RANDOM: Creatinine, Urine: 54 mg/dL

## 2024-09-16 LAB — OSMOLALITY, URINE: Osmolality, Ur: 353 mosm/kg (ref 300–900)

## 2024-09-16 LAB — ETHANOL: Alcohol, Ethyl (B): <15 mg/dL (ref <15)

## 2024-09-16 LAB — PROTIME-INR
INR: 2.2 — ABNORMAL HIGH (ref 0.8–1.2)
Prothrombin Time: 25.6 s — ABNORMAL HIGH (ref 11.4–15.2)

## 2024-09-16 LAB — MAGNESIUM: Magnesium: 1.5 mg/dL — ABNORMAL LOW (ref 1.7–2.4)

## 2024-09-16 LAB — SODIUM, URINE, RANDOM: Sodium, Ur: <30 mmol/L

## 2024-09-16 MED ORDER — ONDANSETRON HCL 4 MG PO TABS: 4.0000 mg | ORAL_TABLET | Freq: Four times a day (QID) | ORAL | Status: DC | PRN

## 2024-09-16 MED ORDER — SODIUM CHLORIDE 0.9% IV SOLUTION: Freq: Once | INTRAVENOUS | Status: AC

## 2024-09-16 MED ORDER — FAMOTIDINE IN NACL 20-0.9 MG/50ML-% IV SOLN
20.0000 mg | INTRAVENOUS | Status: DC
  Administered 2024-09-17 – 2024-09-19 (×4): 20 mg via INTRAVENOUS
  Filled 2024-09-16 (×4): qty 50

## 2024-09-16 MED ORDER — SODIUM BICARBONATE 8.4 % IV SOLN
INTRAVENOUS | Status: AC
  Filled 2024-09-16 (×4): qty 1000

## 2024-09-16 MED ORDER — ENSURE PLUS HIGH PROTEIN PO LIQD
237.0000 mL | Freq: Three times a day (TID) | ORAL | Status: DC
  Administered 2024-09-17 – 2024-09-20 (×4): 237 mL via ORAL
  Filled 2024-09-16 (×2): qty 237

## 2024-09-16 MED ORDER — SODIUM CHLORIDE 0.9 % IV SOLN
2.0000 g | INTRAVENOUS | Status: AC
  Administered 2024-09-17 – 2024-09-19 (×3): 2 g via INTRAVENOUS
  Filled 2024-09-16 (×3): qty 20

## 2024-09-16 MED ORDER — SODIUM CHLORIDE 0.9% FLUSH
3.0000 mL | Freq: Once | INTRAVENOUS | Status: AC
  Administered 2024-09-16: 3 mL via INTRAVENOUS

## 2024-09-16 MED ORDER — TRIMETHOBENZAMIDE HCL 100 MG/ML IM SOLN: 200.0000 mg | Freq: Three times a day (TID) | INTRAMUSCULAR | Status: DC | PRN

## 2024-09-16 MED ORDER — METHYLPREDNISOLONE SODIUM SUCC 125 MG IJ SOLR
60.0000 mg | Freq: Four times a day (QID) | INTRAMUSCULAR | Status: AC
  Administered 2024-09-17 (×4): 60 mg via INTRAVENOUS
  Filled 2024-09-16 (×4): qty 2

## 2024-09-16 MED ORDER — SODIUM CHLORIDE 0.9 % IV BOLUS
1000.0000 mL | Freq: Once | INTRAVENOUS | Status: AC
  Administered 2024-09-16: 1000 mL via INTRAVENOUS

## 2024-09-16 MED ORDER — METRONIDAZOLE 500 MG/100ML IV SOLN
500.0000 mg | Freq: Two times a day (BID) | INTRAVENOUS | Status: DC
  Administered 2024-09-17 (×2): 500 mg via INTRAVENOUS
  Filled 2024-09-16 (×3): qty 100

## 2024-09-16 MED ORDER — IOHEXOL 350 MG/ML SOLN
75.0000 mL | Freq: Once | INTRAVENOUS | Status: AC | PRN
  Administered 2024-09-16: 50 mL via INTRAVENOUS

## 2024-09-16 MED ORDER — SODIUM CHLORIDE 0.9% FLUSH: 3.0000 mL | INTRAVENOUS | Status: DC | PRN

## 2024-09-16 MED ORDER — LEVALBUTEROL HCL 0.63 MG/3ML IN NEBU: 0.6300 mg | INHALATION_SOLUTION | Freq: Four times a day (QID) | RESPIRATORY_TRACT | Status: DC | PRN

## 2024-09-16 MED ORDER — HYDROCORTISONE SOD SUC (PF) 100 MG IJ SOLR
100.0000 mg | Freq: Once | INTRAMUSCULAR | Status: AC
  Administered 2024-09-16: 100 mg via INTRAVENOUS
  Filled 2024-09-16: qty 2

## 2024-09-16 MED ORDER — SODIUM CHLORIDE 0.9% FLUSH
3.0000 mL | Freq: Two times a day (BID) | INTRAVENOUS | Status: DC
  Administered 2024-09-16 – 2024-09-19 (×7): 3 mL via INTRAVENOUS

## 2024-09-16 MED ORDER — SODIUM CHLORIDE 0.9 % IV SOLN: 250.0000 mL | INTRAVENOUS | Status: AC | PRN

## 2024-09-16 MED ORDER — ONDANSETRON HCL 4 MG/2ML IJ SOLN: 4.0000 mg | Freq: Four times a day (QID) | INTRAMUSCULAR | Status: DC | PRN

## 2024-09-16 MED ORDER — CALCIUM GLUCONATE-NACL 1-0.675 GM/50ML-% IV SOLN
1.0000 g | Freq: Once | INTRAVENOUS | Status: AC
Start: 2024-09-16 — End: 2024-09-16
  Administered 2024-09-16: 1000 mg via INTRAVENOUS
  Filled 2024-09-16: qty 50

## 2024-09-16 MED ORDER — SODIUM CHLORIDE 0.9% FLUSH
3.0000 mL | Freq: Two times a day (BID) | INTRAVENOUS | Status: DC
  Administered 2024-09-16 – 2024-09-19 (×5): 3 mL via INTRAVENOUS

## 2024-09-16 MED ORDER — CHLORHEXIDINE GLUCONATE CLOTH 2 % EX PADS
6.0000 | MEDICATED_PAD | Freq: Every day | CUTANEOUS | Status: DC
  Administered 2024-09-18 – 2024-09-19 (×2): 6 via TOPICAL

## 2024-09-16 MED ORDER — ESCITALOPRAM OXALATE 20 MG PO TABS
20.0000 mg | ORAL_TABLET | Freq: Every day | ORAL | Status: DC
  Administered 2024-09-18 – 2024-09-20 (×3): 20 mg via ORAL
  Filled 2024-09-16 (×3): qty 1

## 2024-09-16 NOTE — Code Documentation (Signed)
 Stroke Response Nurse Documentation Code Documentation  [name] is a 73 y.o. female arriving to [place]  via Private Vehicle on 09-16-24 with past medical hx of PAF, HTN, anemia, hematuria, CKD, CVA . On Eliquis  (apixaban ) daily. Code stroke was activated by ED.   Patient from home she came in this afternoon with increased weakness and anemia.  Per EDP she was speaking fluently at 1755 then a few minutes later she had difficulty speaking.  Code Stroke activated for aphasia  Stroke team at the bedside on patient arrival. Labs drawn and patient cleared for CT by Dr. Cottie. Patient to CT with team. NIHSS 10, see documentation for details and code stroke times. Patient with disoriented, not following commands, right arm weakness, Global aphasia , and dysarthria  on exam. The following imaging was completed:  CT Head and CTA. Patient is not a candidate for IV Thrombolytic due to Hgb 4.6 and hematuria. Patient is not a candidate for IR due to no LVO on CTA.   Care Plan: VS and NIHSS q 2 hours x 12 hours then q 4 hours.    Bedside handoff with ED RN [name]    [name]  Stroke Response RN

## 2024-09-16 NOTE — ED Notes (Signed)
 Per nurse Juanita, Dr Viann stated that we can collect blood cultures while pt getting blood transfusion.  KM

## 2024-09-16 NOTE — ED Triage Notes (Signed)
 Pt bib family with reports of decreased PO intake for the last several days. Today patient has been confused, unable to state family names. Recently treated for UTI. Pt denies any pain. R sided deficits from previous CVA.

## 2024-09-16 NOTE — Plan of Care (Addendum)
 Yes transaminitis Cholelithiasis with acute cholecystitis - Given patient has transaminitis obtain right upper quadrant ultrasound which showing cholelithiasis in addition with acute cholecystitis. -Physical exam no evidence of Murphy sign. - Patient has significant penicillin allergy however previously tolerated cephalosporin verified with pharmacy. Starting IV ceftriaxone  and metronidazole.  Acute cystitis - UA showing evidence of UTI.  Pending urine culture. - Will be treating with IV ceftriaxone  for management of acute cholecystitis which will also treat cystitis.

## 2024-09-16 NOTE — ED Provider Notes (Signed)
 [place] EMERGENCY DEPARTMENT AT [facility] Provider Note   CSN: [id number] Arrival date & time: 09/16/24  1627  An emergency department physician performed an initial assessment on this suspected stroke patient at 1800.  Patient presents with: Altered Mental Status   [name] is a 73 y.o. female with a history of polycythemia vera, acute blood loss anemia, chronic kidney disease, right lower extremity amputation, coronary disease, paroxysmal A-fib on Eliquis , hypertension, presented to the ED in the company of her daughter with concern for generalized weakness.  Her daughter reports the patient had significant persistent hematuria over the past week.  They were seen by the urologist 3 days ago at which time the hematuria appears to have resolved.  They were told that the patient may have a mass on her bladder.  However the urologist did advise that they continue the Eliquis  and aspirin  that she is on chronically for now.  She reports that the hematuria appears to have resolved, and there were no bloody stools, with the patient has been extremely fatigued, with no energy, no appetite, complaining of dizziness for 3 days.  I reviewed the patient's external records she is most recently hospitalized for urinary tract infection 2 weeks ago, discharged September 14.   HGB 9.0 -> 8.7 -> 4.7 (3 days ago)   HPI     Prior to Admission medications   Medication Sig Start Date End Date Taking? Authorizing Provider  amLODipine  (NORVASC ) 10 MG tablet TAKE ONE TABLET ONCE DAILY 07/24/24  Yes Dettinger, Fonda LABOR, MD  apixaban  (ELIQUIS ) 2.5 MG TABS tablet Take 1 tablet (2.5 mg total) by mouth 2 (two) times daily. 05/03/24  Yes [facility] Morton Hummer, Nena, NP  aspirin  EC 81 MG tablet Take 1 tablet (81 mg total) by mouth daily. Swallow whole. 09/11/24  Yes Ricky Fines, MD  calcitRIOL (ROCALTROL) 0.25 MCG capsule Take 0.25 mcg by mouth 2 (two) times a week. 06/19/24  Yes [provider]  colchicine  0.6 MG tablet TAKE ONE TABLET ONCE DAILY 07/24/24  Yes Dettinger, Joshua A, MD  diclofenac  Sodium (VOLTAREN ) 1 % GEL Apply 2 g topically 4 (four) times daily as needed (pain). 02/02/24  Yes Ricky Fines, MD  diltiazem  (CARDIZEM  CD) 240 MG 24 hr capsule Take 240 mg by mouth daily. 07/24/24  Yes [provider]  escitalopram  (LEXAPRO ) 20 MG tablet TAKE ONE TABLET ONCE DAILY 07/24/24  Yes Dettinger, Joshua A, MD  ezetimibe  (ZETIA ) 10 MG tablet Take 1 tablet (10 mg total) by mouth daily. 05/03/24  Yes [facility] Morton Hummer, Nena, NP  fenofibrate  (TRICOR ) 145 MG tablet Take 1 tablet (145 mg total) by mouth daily. 01/13/24  Yes West, Katlyn D, NP  flecainide  (TAMBOCOR ) 50 MG tablet Take 50 mg by mouth 2 (two) times daily.   Yes [provider]  lisinopril  (ZESTRIL ) 10 MG tablet Take 1 tablet (10 mg total) by mouth daily. 08/08/24  Yes Dettinger, Fonda LABOR, MD  pantoprazole  (PROTONIX ) 40 MG tablet TAKE ONE TABLET ONCE DAILY 07/24/24  Yes Dettinger, Fonda LABOR, MD  zinc  oxide 20 % ointment Apply 1 Application topically as needed for irritation.   Yes [provider]  alendronate  (FOSAMAX ) 70 MG tablet TAKE ONE TABLET ONCE WEEKLY Patient not taking: Reported on 09/16/2024 07/24/24   Dettinger, Fonda LABOR, MD  furosemide  (LASIX ) 40 MG tablet Take 1 tablet (40 mg total) by mouth daily as needed for fluid or edema. Patient not taking: Reported on 09/16/2024 09/03/24  Ricky Fines, MD  sodium bicarbonate  650 MG tablet Take 1 tablet (650 mg total) by mouth 2 (two) times daily. Patient not taking: Reported on 09/16/2024 09/03/24   Ricky Fines, MD    Allergies: Atenolol, Demerol   [meperidine  hcl], Demerol  [meperidine ], Gabapentin , Inderal [propranolol], Prednisone, Statins, Warfarin and related, Crestor [rosuvastatin], Docosahexaenoic acid (dha) (fish), Lactose intolerance (gi), Lipitor [atorvastatin ], Lovaza [omega-3-acid ethyl esters (fish)], Penicillins, Pravastatin , Simvastatin ,  Trilipix [choline fenofibrate ], Warfarin, Hydralazine , Effexor [venlafaxine hcl], Nexium [esomeprazole magnesium ], Percocet [oxycodone -acetaminophen ], and Plavix  [clopidogrel  bisulfate]    Review of Systems  Updated Vital Signs BP (!) 115/58   Pulse 84   Temp 98.1 F (36.7 C) (Oral)   Resp (!) 25   Ht 5' 4 (1.626 m)   Wt 41.7 kg   SpO2 100%   BMI 15.78 kg/m   Physical Exam Constitutional:      General: She is not in acute distress.    Comments: Appears very tired, pale  HENT:     Head: Normocephalic and atraumatic.  Eyes:     Pupils: Pupils are equal, round, and reactive to light.     Comments: Conjunctival pallor  Cardiovascular:     Rate and Rhythm: Normal rate and regular rhythm.  Pulmonary:     Effort: Pulmonary effort is normal. No respiratory distress.  Abdominal:     General: There is no distension.     Tenderness: There is no abdominal tenderness.  Musculoskeletal:     Comments: Bruising on the forearms, right lower extremity amputation  Skin:    General: Skin is warm and dry.  Neurological:     General: No focal deficit present.     Mental Status: She is alert. Mental status is at baseline.     (all labs ordered are listed, but only abnormal results are displayed) Labs Reviewed  COMPREHENSIVE METABOLIC PANEL WITH GFR - Abnormal; Notable for the following components:      Result Value   Sodium 126 (*)    Potassium 5.8 (*)    CO2 11 (*)    BUN 76 (*)    Creatinine, Ser 2.65 (*)    Calcium  7.5 (*)    Total Protein 5.1 (*)    Albumin  2.5 (*)    AST 265 (*)    ALT 164 (*)    GFR, Estimated 18 (*)    All other components within normal limits  CBC - Abnormal; Notable for the following components:   WBC 54.1 (*)    RBC 2.37 (*)    Hemoglobin 4.6 (*)    HCT 16.5 (*)    MCV 69.6 (*)    MCH 19.4 (*)    MCHC 27.9 (*)    RDW 23.9 (*)    Platelets 902 (*)    nRBC 0.8 (*)    All other components within normal limits  MAGNESIUM  - Abnormal; Notable for  the following components:   Magnesium  1.5 (*)    All other components within normal limits  PROTIME-INR - Abnormal; Notable for the following components:   Prothrombin Time 25.6 (*)    INR 2.2 (*)    All other components within normal limits  APTT - Abnormal; Notable for the following components:   aPTT 48 (*)    All other components within normal limits  URINALYSIS, ROUTINE W REFLEX MICROSCOPIC - Abnormal; Notable for the following components:   APPearance CLOUDY (*)    Hgb urine dipstick MODERATE (*)    Protein, ur 100 (*)  Leukocytes,Ua LARGE (*)    Bacteria, UA RARE (*)    All other components within normal limits  OSMOLALITY - Abnormal; Notable for the following components:   Osmolality 297 (*)    All other components within normal limits  URINE CULTURE  ETHANOL  CREATININE, URINE, RANDOM  SODIUM, URINE, RANDOM  T4, FREE  OSMOLALITY, URINE  URINALYSIS, ROUTINE W REFLEX MICROSCOPIC  DIFFERENTIAL  CBC  CORTISOL-AM, BLOOD  TSH  COMPREHENSIVE METABOLIC PANEL WITH GFR  CBC  COMPREHENSIVE METABOLIC PANEL WITH GFR  OCCULT BLOOD X 1 CARD TO LAB, STOOL  T4, FREE  HEPATITIS PANEL, ACUTE  CBG MONITORING, ED  CBG MONITORING, ED  I-STAT CHEM 8, ED  CBG MONITORING, ED  TYPE AND SCREEN  PREPARE RBC (CROSSMATCH)  PREPARE RBC (CROSSMATCH)    EKG: EKG Interpretation Date/Time:  Saturday September 16 2024 17:26:20 EDT Ventricular Rate:  77 PR Interval:  158 QRS Duration:  130 QT Interval:  461 QTC Calculation: 522 R Axis:   -44  Text Interpretation: Sinus rhythm LVH with IVCD, LAD and secondary repol abnrm Prolonged QT interval Confirmed by Cottie Cough [phone]) on 09/16/2024 5:35:30 PM  Radiology: CT ANGIO HEAD NECK W WO CM (CODE STROKE) Result Date: 09/16/2024 EXAM: CTA HEAD AND NECK WITH AND WITHOUT 09/16/2024 06:24:15 PM TECHNIQUE: CTA of the head and neck was performed with and without the administration of 50 mL of intravenous contrast (iohexol  (OMNIPAQUE ) 350  MG/ML injection 75 mL IOHEXOL  350 MG/ML SOLN). Multiplanar 2D and/or 3D reformatted images are provided for review. Automated exposure control, iterative reconstruction, and/or weight based adjustment of the mA/kV was utilized to reduce the radiation dose to as low as reasonably achievable. Stenosis of the internal carotid arteries measured using NASCET criteria. COMPARISON: CT angio head and neck 02/09/2024 at [facility] and CTA head and neck 03/04/2021 at [facility] CLINICAL HISTORY: Neuro deficit, acute, stroke suspected. FINDINGS: CTA NECK: AORTIC ARCH AND ARCH VESSELS: Extensive atherosclerotic changes are again noted at the aortic arch and great vessel origins. Severe stenosis of the left subclavian artery proximal to the vertebral artery has again noted. No dissection or arterial injury. No significant stenosis of the brachiocephalic artery. CERVICAL CAROTID ARTERIES: Atherosclerotic calcifications are present throughout the right common carotid artery without a significant stenosis relative to the more distal vessel. Atherosclerotic calcifications are present in the proximal right ICA without significant stenosis. Diffuse mural calcification are present throughout the left common carotid artery without significant stenosis. Patient motion is present through the area of stenosis previously noted in the proximal left ICA. The stenosis is not significantly changed. No dissection or arterial injury. CERVICAL VERTEBRAL ARTERIES: The left vertebral artery is occluded in the V1 and proximal left V2 segments. It is partially reconstituted in the neck. The right vertebral artery shows no dissection, arterial injury, or significant stenosis in its cervical segments. LUNGS AND MEDIASTINUM: Unremarkable. SOFT TISSUES: No acute abnormality. BONES: Multilevel degenerative changes are again noted in the cervical spine. CTA HEAD: ANTERIOR CIRCULATION: Atherosclerotic calcifications are present within the  cavernous internal carotid arteries bilaterally without significant stenoses through the ICA terminae. New left A1 segment is hypoplastic. No significant stenosis of the middle cerebral arteries. No aneurysm. POSTERIOR CIRCULATION: A fetal-type right cerebral artery is again noted. Moderate narrowing is present in the left P1 segment. Stable mild narrowing in the mid basilar artery is stable. Moderate narrowing is present in the distal vertebral arteries bilaterally. No aneurysm. OTHER: ECA branch vessels are opacified bilaterally. No  dural venous sinus thrombosis on this non-dedicated study. IMPRESSION: 1. No large vessel occlusion or aneurysm in the head or neck. 2. 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 3. 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 4. Moderate narrowing in the left P1 segment, distal vertebral arteries bilaterally, and stable mild narrowing in the mid basilar artery. 5. Extensive atherosclerotic changes as described. Findings were communicated to doctor Michaela via the amion system at 06:48 pm Electronically signed by: Lonni Necessary MD 09/16/2024 07:00 PM EDT RP Workstation: [id number]   CT HEAD CODE STROKE WO CONTRAST Result Date: 09/16/2024 EXAM: CT HEAD WITHOUT CONTRAST 09/16/2024 06:12:59 PM TECHNIQUE: CT of the head was performed without the administration of intravenous contrast. Automated exposure control, iterative reconstruction, and/or weight based adjustment of the mA/kV was utilized to reduce the radiation dose to as low as reasonably achievable. The study is mildly degraded by patient motion. COMPARISON: CT head without contrast 01/11/2024. MR head without contrast 03/09/2024 at [facility]. CLINICAL HISTORY: Neuro deficit, acute, stroke suspected. FINDINGS: BRAIN AND VENTRICLES: No acute hemorrhage. No evidence of acute infarct. No hydrocephalus. No  extra-axial collection. No mass effect or midline shift. Remote lacunar infarcts are again noted within the right caudate head and internal capsule. Confluent Periventricular and scattered subcortical white matter hypoattenuation is moderately advanced for age. This most likely reflects the sequelae of chronic microvascular ischemia. Atherosclerotic calcifications are present in the cavernous carotid arteries bilaterally and at the dural margin of both vertebral arteries. No hyperdense vessel is present. ORBITS: No acute abnormality. SINUSES: No acute abnormality. SOFT TISSUES AND SKULL: No acute soft tissue abnormality. No skull fracture. [place] stroke program early CT (aspect) score: Ganglionic (caudate, ic, Lentiform Nucleus, insula, M1-m3): 7 Supraganglionic (m4-m6): 3 Total: 10 IMPRESSION: 1. No acute intracranial abnormality.  ASPECTS 10/10 2. Remote lacunar infarcts in the right caudate head and internal capsule. 3. Moderately advanced confluent periventricular and scattered subcortical white matter hypoattenuation, likely sequelae of chronic microvascular ischemia. 4. Atherosclerotic calcifications in the cavernous carotid arteries bilaterally and at the dural margin of both vertebral arteries. No hyperdense vessel is present. The pertinent results were texted to Dr. Michaela via the Amion system at 6:21 pm. Electronically signed by: Lonni Necessary MD 09/16/2024 06:23 PM EDT RP Workstation: [id number]     .Critical Care  Performed by: Cottie Donnice PARAS, MD Authorized by: Cottie Donnice PARAS, MD   Critical care provider statement:    Critical care time (minutes):  30   Critical care time was exclusive of:  Separately billable procedures and treating other patients   Critical care was necessary to treat or prevent imminent or life-threatening deterioration of the following conditions:  Circulatory failure   Critical care was time spent personally by me on the following activities:  Ordering  and performing treatments and interventions, ordering and review of laboratory studies, ordering and review of radiographic studies, pulse oximetry, review of old charts, examination of patient and evaluation of patient's response to treatment   Care discussed with: admitting provider   Comments:     pRBC for symptomatic anemia    Medications Ordered in the ED  sodium bicarbonate  150 mEq in dextrose  5 % 1,150 mL infusion ( Intravenous New Bag/Given 09/16/24 2004)  Chlorhexidine  Gluconate Cloth 2 % PADS 6 each (has no administration in time range)  escitalopram  (LEXAPRO ) tablet 20 mg (has no administration in time range)  sodium chloride  flush (NS) 0.9 % injection 3 mL (3 mLs Intravenous Given 09/16/24 2205)  sodium chloride  flush (NS) 0.9 % injection 3 mL (3 mLs Intravenous Given 09/16/24 2206)  sodium chloride  flush (NS) 0.9 % injection 3 mL (has no administration in time range)  0.9 %  sodium chloride  infusion (has no administration in time range)  methylPREDNISolone  sodium succinate (SOLU-MEDROL ) 125 mg/2 mL injection 60 mg (has no administration in time range)  famotidine (PEPCID) IVPB 20 mg premix (has no administration in time range)  trimethobenzamide (TIGAN) injection 200 mg (has no administration in time range)  levalbuterol (XOPENEX) nebulizer solution 0.63 mg (has no administration in time range)  feeding supplement (ENSURE PLUS HIGH PROTEIN) liquid 237 mL (has no administration in time range)  0.9 %  sodium chloride  infusion (Manually program via Guardrails IV Fluids) (0 mLs Intravenous Paused 09/16/24 2235)  sodium chloride  0.9 % bolus 1,000 mL (0 mLs Intravenous Stopped 09/16/24 1902)  calcium  gluconate 1 g/ 50 mL sodium chloride  IVPB (0 mg Intravenous Stopped 09/16/24 1911)  sodium chloride  flush (NS) 0.9 % injection 3 mL (3 mLs Intravenous Given 09/16/24 1912)  iohexol  (OMNIPAQUE ) 350 MG/ML injection 75 mL (50 mLs Intravenous Contrast Given 09/16/24 1829)  hydrocortisone  sodium  succinate (SOLU-CORTEF ) 100 MG injection 100 mg (100 mg Intravenous Given 09/16/24 1934)  0.9 %  sodium chloride  infusion (Manually program via Guardrails IV Fluids) (0 mLs Intravenous Stopped 09/16/24 2221)    Clinical Course as of 09/16/24 2333  Sat Sep 16, 2024  1741 Urine cx from 9/11 last hospitalization with no significant growth [MT]  1749 Dr Diona oncology reviewed the case by phone with me.  She agrees with plan for admission and to proceed with blood transfusion.  She reports that the white blood cell count and platelet elevation could be consistent with her underlying medical conditions but does not require any further treatment at this time. [MT]  1749 Patient notably is hyponatremic and has an AKI again, and hyperkalemic [MT]  1750 She has a mildly prolonged QT interval on the EKG but no other evidence of acute hyperkalemia.  I suspect that the hyperkalemia would rapidly improve with hydration, as it is likely being caused by her kidney function. [MT]  1751 Patient is notably hypocalcemic and so I will administer calcium  gluconate for expected hypocalcemia in the setting of transfusion, as well as for hyperkalemia [MT]  1800 I was called back into the room as the patient abruptly began babbling or mumbling and was no longer able to form words.  She claimed the weakness of her left side I cannot lift up her left arm.  This is an abrupt change in my earlier exam.  Therefore we have activated a code stroke, last known well would have been 5:55 PM. [MT]  1801 Glucose is 76.  Blood pressure remained stable, on repeat check is 120 mmhg [MT]  1901 Neurologist DR Michaela reports no large vessel occlusion amenable to intervention at this time, patient would be stable to admit to the medical service [MT]  1906 BP dropping, map 65 - I've ordered the iv solucortef now for possible adrenal unsufficency [MT]  1923 Admitted to dr sendil [MT]    Clinical Course User Index [MT] Cottie Donnice PARAS, MD                                  Medical Decision Making Amount and/or Complexity  of Data Reviewed Labs: ordered. Radiology: ordered.  Risk Prescription drug management. Decision regarding hospitalization.   This patient presents to the ED with concern for fatigue, weakness. This involves an extensive number of treatment options, and is a complaint that carries with it a high risk of complications and morbidity.  The differential diagnosis includes anemia versus infection versus metabolic derangement versus other  Co-morbidities that complicate the patient evaluation: Eliquis  use at high risk of bleeding  Additional history obtained from patient's family at bedside  External records from outside source obtained and reviewed including hospital discharge summary from 2 weeks ago and outpatient medical records from urology office 3 days ago as well as CBC count  I ordered and personally interpreted labs.  The pertinent results include: Sodium 126, potassium 5.8, AKI with elevated BUN and creatinine, consistent with prerenal injury.  Chronically elevated AST and ALT levels.  Calcium  7.5.  Hemoglobin is 4.6.  This is microcytic.  Platelets and white blood cell count elevated  The patient was maintained on a cardiac monitor.  I personally viewed and interpreted the cardiac monitored which showed an underlying rhythm of: Sinus rhythm  Per my interpretation the patient's ECG shows sinus rhythm with mildly prolonged QTc, no acute ischemic findings  I ordered medication including IV fluids, blood transfusion, IV gluconate  I have reviewed the patients home medicines and have made adjustments as needed  Test Considered: No emergent indication for CT imaging at this time.  I spoke with following with hematology oncology, see ED course  The patient and family were consented for blood transfusion and in agreement with proceeding  After the interventions noted above, I reevaluated the patient  and found that they have: improved  Patient's hyponatremia and hyperkalemia in the setting of a kidney injury could also be indicative of an adrenal insufficiency; however, her blood pressure remains normal.  If she becomes hypotensive I think it be reasonable to proceed with steroid for adrenal crisis, but in the meantime I think we will avoid steroids which she has adverse reactions to.  It is very possible that the hyponatremia is related to her poor oral intake which has been minimal at home, and her hyperkalemia is related to her dehydration kidney disease.  Disposition:  After consideration of the diagnostic results and the patient's response to treatment, I feel that the patient would benefit from . Admission     Final diagnoses:  Symptomatic anemia  Hypocalcemia  Hyperkalemia  Aphasia  Left arm weakness    ED Discharge Orders     None          Cottie Donnice PARAS, MD 09/16/24 2333

## 2024-09-16 NOTE — Consult Note (Signed)
 NEUROLOGY CONSULT NOTE   Date of service: September 16, 2024 Patient Name: [name] MRN:  [id number] DOB:  1951/01/16 Chief Complaint: Aphasia Requesting Provider: Sundil, Subrina, MD  History of Present Illness  Vernita Tague Canter is a 73 y.o. female with hx of A-fib on anticoagulation, CVA with right-sided hemiparesis, polycythemia vera who presented to the emergency department after an episode of transient speech difficulty earlier today.  She was seen at her urologist clinic for persistent hematuria 2 days ago and found to have a hemoglobin of four.  Earlier today, her daughter reports that she had about a 5-minute episode of worsening arm weakness and aphasia which resolved on its own.  In the emergency department, she was found to have hemoglobin of 4.6 but was speaking relatively normally.  LKW: 1755 IV Thrombolysis: No, outside of window EVT: No, no LVO   NIHSS components Score: Comment  1a Level of Conscious 0[x]  1[]  2[]  3[]      1b LOC Questions 0[]  1[]  2[x]       1c LOC Commands 0[]  1[x]  2[]       2 Best Gaze 0[x]  1[]  2[]       3 Visual 0[x]  1[]  2[]  3[]      4 Facial Palsy 0[]  1[x]  2[]  3[]      5a Motor Arm - left 0[x]  1[]  2[]  3[]  4[]  UN[]    5b Motor Arm - Right 0[]  1[]  2[]  3[x]  4[]  UN[]    6a Motor Leg - Left 0[x]  1[]  2[]  3[]  4[]  UN[]    6b Motor Leg - Right 0[x]  1[]  2[]  3[]  4[]  UN[]    7 Limb Ataxia 0[x]  1[]  2[]  UN[]      8 Sensory 0[x]  1[]  2[]  UN[]      9 Best Language 0[]  1[]  2[x]  3[]      10 Dysarthria 0[]  1[x]  2[]  UN[]      11 Extinct. and Inattention 0[x]  1[]  2[]       TOTAL: 10       Past History   Past Medical History:  Diagnosis Date   Acute blood loss anemia 05/04/2020   Acute ischemic stroke (HCC)    Acute respiratory failure with hypoxia (HCC) 01/29/2024   AKI (acute kidney injury)    Allergy ?, Don't remember   Arthritis    fingers, some in my knees (01/28/2016)   Atrial fibrillation with RVR, hx of PAF previously 04/27/2013   Paroxysmal atrial  fibrillation     Formatting of this note might be different from the original.  Overview:   Paroxysmal atrial fibrillation     Last Assessment & Plan:   History of PAF maintaining sinus rhythm on flecainide  and Eliquis  oral anti-coagulation     Blood transfusion without reported diagnosis    During surgery   CAD (coronary artery disease), per cath 04/27/13, non obstructive disease, EF 65-70% 05/11/2013   Carotid disease, bilateral 09/20/2013   Lt carotid 50-69% stentosis, less on rt   Cataract    Don't remember   Cellulitis of left leg 01/16/2022   Closed fracture of left tibial plateau 10/23/2018   Depression Don't remember   Dysrhythmia    Elevated troponin 05/04/2020   Encounter for support and coordination of transition of care 11/11/2021   Fatty liver    Fracture of ankle 10/23/2018   GERD (gastroesophageal reflux disease)    Glaucoma    H/O cardiovascular stress test 12/27/2009   negative for ischemia   Headache    occasional since eye pressure regulated (01/28/2016)   Hospital discharge follow-up 10/15/2021  Hyperlipidemia    Hypertension    Hypertensive crisis 01/08/2016   Formatting of this note might be different from the original.  hypertension     Last Assessment & Plan:   BP at goal and significantly improved from previous visit. Patient tolerating valsartan  and chlorthalidone  very well. Reports 1 episode of symptomatic hypotension that resolved after few hours. Denies falls or increase fatigue. Will continue current therapy without changes and follow up as nee   Hypertensive emergency 11/28/2022   Leukocytosis 07/15/2015   Migraine    stopped w/laser holes to relieve pressure in my eyes; had them 3-4 times/wk before taht (01/28/2016)   Mitral regurgitation,2+ by cath 05/11/2013   NSTEMI (non-ST elevated myocardial infarction) (HCC) 04/27/2013   Osteoporosis IDK   Pain    BOTH KNEES - PT HAS TORN MENISCUS LEFT KNEE   Pain in both wrists 09/03/2021   Pain in left knee  10/03/2018   Paroxysmal atrial fibrillation (HCC)    hx of a fib on ASA  AND ELIQUIS     Pelvic fracture (HCC) 09/09/2018   Polycythemia vera (HCC) 08/20/2015   JAK-2 positive 07/19/15   Prosthesis adjustment 01/13/2019   PVD (peripheral vascular disease) with claudication 07/21/2006   previous L ext iliac stent and rt SFA stent, known occluded Lt SFA   S/P cardiac cath 04/27/2013   NON OBSTRUCTIVE DISEASE, 2+ mr, ef 65-70%   Sacral fracture, closed (HCC) 05/04/2020   Statin intolerance    Stroke (HCC) 12/21/2004   SWELLING ALL OVER AND RT SIDE OF FACE DRAWN AND SPEECH SLURRED AND NUMBNESS ON RIGHT SIDE-- ALL RESOLVED   TIA (transient ischemic attack)    Trauma 05/04/2020    Past Surgical History:  Procedure Laterality Date   ABDOMINAL HYSTERECTOMY  1977   After childbirth   AMPUTATION Right 05/21/2017   Procedure: AMPUTATION ABOVE KNEE;  Surgeon: Oris Krystal FALCON, MD;  Location: [facility] OR;  Service: Vascular;  Laterality: Right;   CARDIAC CATHETERIZATION  04/27/2013   non occlusive disease with mild to mod. calcified lesions in ostial LM and proximal LAD and moderate prox. RC AND DISTAL AV GROOVE LCX, ef   CATARACT EXTRACTION W/PHACO Right 03/21/2015   Procedure: CATARACT EXTRACTION PHACO AND INTRAOCULAR LENS PLACEMENT RIGHT EYE;  Surgeon: Lynwood Hermann, MD;  Location: [facility] ORS;  Service: Ophthalmology;  Laterality: Right;  CDE:5.10   CATARACT EXTRACTION W/PHACO Left 05/16/2015   Procedure: CATARACT EXTRACTION PHACO AND INTRAOCULAR LENS PLACEMENT (IOC);  Surgeon: Lynwood Hermann, MD;  Location: [facility]S;  Service: Ophthalmology;  Laterality: Left;  CDE 5.57   CESAREAN SECTION  12/22/1975   COLONOSCOPY N/A 09/11/2016   Procedure: COLONOSCOPY;  Surgeon: Claudis RAYMOND Rivet, MD;  Location: [facility];  Service: Endoscopy;  Laterality: N/A;  730-moved to 1:00 Ann notified pt   EMBOLECTOMY Right 02/01/2016   Procedure: Thrombectomy of Right Femoral-Popliteal Bypass Graft; Endarterectomy of Right Below  Knee Popliteal Artery and Tibial Peroneal Trunk with Bovine Pericardium Patch Angioplasty.;  Surgeon: Lonni GORMAN Blade, MD;  Location: [facility] OR;  Service: Vascular;  Laterality: Right;   EYE SURGERY     FEMORAL-POPLITEAL BYPASS GRAFT Right 01/31/2016   Procedure: BYPASS GRAFT RIGHT COMMON  FEMORALTO BELOW KNEE POPLITEAL ARTERY BYPASS GRAFT USING PROPATEN GORTEX GRAFT;  Surgeon: Lynwood JONETTA Collum, MD;  Location: [facility] OR;  Service: Vascular;  Laterality: Right;   FEMORAL-POPLITEAL BYPASS GRAFT Right 01/09/2017   Procedure: THROMBECTOMY OF RIGHT FEMORAL-POPLITEAL  ARTERY BYPASS GRAFT; THROMBECTOMY RIGHT  TIBIAL VESSELS;  Surgeon:  Carlin FORBES Haddock, MD;  Location: [facility] OR;  Service: Vascular;  Laterality: Right;   FEMORAL-POPLITEAL BYPASS GRAFT Right 05/17/2017   Procedure: RIGHT FEMORAL-TIBIAL PERONEAL TRUNK ARTERY BYPASS GRAFT USING 71mmX80cm PROPATEN GRAFT WITH REMOVABLE RING;  Surgeon: Oris Krystal FALCON, MD;  Location: [facility] OR;  Service: Vascular;  Laterality: Right;   FRACTURE SURGERY     ILIAC ARTERY STENT  07/21/2006   external iliac and Rt SFA stent 07/2006 AND THE LEFT WAS IN 2006   KNEE ARTHROSCOPY WITH MEDIAL MENISECTOMY Left 03/27/2014   Procedure: left knee arthorscopy with medial chondraplasty of the medial femoral and patella, medial microfracture technique of medial femoral condyl;  Surgeon: Tanda DELENA Heading, MD;  Location: [facility] ORS;  Service: Orthopedics;  Laterality: Left;   LEFT HEART CATHETERIZATION WITH CORONARY ANGIOGRAM Right 04/27/2013   Procedure: LEFT HEART CATHETERIZATION WITH CORONARY ANGIOGRAM;  Surgeon: Alm LELON Clay, MD;  Location: [facility] CATH LAB;  Service: Cardiovascular;  Laterality: Right;   LOWER EXTREMITY ANGIOGRAM Right 01/31/2016   Procedure: INTRAOP RIGHT LOWER EXTREMITY ANGIOGRAM;  Surgeon: Lynwood JONETTA Collum, MD;  Location: [facility] OR;  Service: Vascular;  Laterality: Right;   ORIF WRIST FRACTURE Right 12/21/1981   OVARIAN CYST REMOVAL Right    PATCH ANGIOPLASTY Right 01/31/2016    Procedure: RIGHT COMMON FEMORAL AND PROFUNDA FEMORIS ENDARECTOMY WITH PATCH ANGIOPLASTY;  Surgeon: Lynwood JONETTA Collum, MD;  Location: [facility] OR;  Service: Vascular;  Laterality: Right;   PATCH ANGIOPLASTY Right 01/09/2017   Procedure: PATCH ANGIOPLASTY RIGHT POPLITEAL ARTERY BYPASS GRAFT;  Surgeon: Carlin FORBES Haddock, MD;  Location: [facility] OR;  Service: Vascular;  Laterality: Right;   PERIPHERAL VASCULAR CATHETERIZATION N/A 06/27/2015   Procedure: Lower Extremity Angiography;  Surgeon: Dorn JINNY Lesches, MD;  Location: [facility] INVASIVE CV LAB;  Service: Cardiovascular;  Laterality: N/A;   PERIPHERAL VASCULAR CATHETERIZATION Bilateral 01/29/2016   Procedure: Lower Extremity Angiography;  Surgeon: Deatrice DELENA Cage, MD;  Location: [facility] INVASIVE CV LAB;  Service: Cardiovascular;  Laterality: Bilateral;   PERIPHERAL VASCULAR CATHETERIZATION N/A 01/29/2016   Procedure: Abdominal Aortogram;  Surgeon: Deatrice DELENA Cage, MD;  Location: [facility] INVASIVE CV LAB;  Service: Cardiovascular;  Laterality: N/A;   REFRACTIVE SURGERY Bilateral    6 in one eye; 7 in the other; to relieve pressure; not glaucoma (01/28/2016)   SFA Right 06/27/2015   overlapping Bahn covered stents   TUBAL LIGATION  12/21/1981   VEIN HARVEST Left 05/17/2017   Procedure: LEFT LEG GREATER SAPHENOUS VEIN HARVEST;  Surgeon: Oris Krystal FALCON, MD;  Location: [facility] OR;  Service: Vascular;  Laterality: Left;   VEIN REPAIR Right 01/31/2016   Procedure: RIGHT GREATER SAPHENOUS VEIN EXAMNED BUT NOT REMOVED;  Surgeon: Lynwood JONETTA Collum, MD;  Location: [facility] OR;  Service: Vascular;  Laterality: Right;    Family History: Family History  Problem Relation Age of Onset   CAD Mother    Lung cancer Mother    Heart attack Mother    Miscarriages / Stillbirths Mother    Obesity Mother    Stroke Mother    Vision loss Mother    CAD Father    Heart disease Father    Heart attack Father 48       died in hes 60's with heart disease   Hearing loss Father    Healthy Sister    Liver  cancer Brother    Alcohol  abuse Brother    Arthritis Sister    CAD Brother        has had CABG  CAD Brother    Cancer Maternal Aunt    Cancer Sister     Social History  reports that she quit smoking about 11 years ago. Her smoking use included cigarettes. She started smoking about 56 years ago. She has a 45 pack-year smoking history. She has never used smokeless tobacco. She reports that she does not drink alcohol  and does not use drugs.  Allergies  Allergen Reactions   Atenolol Rash and Itching   Demerol   [Meperidine  Hcl] Itching   Demerol  [Meperidine ] Itching and Other (See Comments)    Unknown, can't remember    Gabapentin  Anxiety and Other (See Comments)    SI and HI   Inderal [Propranolol] Other (See Comments) and Itching    Doesn't remember    Prednisone Other (See Comments)    Muscle spasms   Statins Itching and Other (See Comments)    Simvastatin  - caused severe itching Pravastatin  - caused lesser tiching One type of statin? Caused stroke in 2006, and other symptoms as result   Warfarin And Related Other (See Comments)    Caused nose bleeds   Crestor [Rosuvastatin] Itching and Rash   Docosahexaenoic Acid (Dha) (Fish)     nosebleeds   Lactose Intolerance (Gi) Other (See Comments)    Bloating, gas   Lipitor [Atorvastatin ] Rash   Lovaza [Omega-3-Acid Ethyl Esters (Fish)] Other (See Comments)    nosebleeds    Penicillins Hives, Rash and Other (See Comments)    Immediate rash, facial/tongue/throat swelling, SOB or lightheadedness with hypotension Questionable high fever    Pravastatin  Itching and Rash   Simvastatin  Itching, Rash and Other (See Comments)   Trilipix [Choline Fenofibrate ] Other (See Comments)    Doesn't remember    Warfarin Other (See Comments)    nosebleeds   Hydralazine  Swelling   Effexor [Venlafaxine Hcl] Other (See Comments)    Doesn't remember    Nexium [Esomeprazole Magnesium ] Rash   Percocet [Oxycodone -Acetaminophen ] Other (See Comments)     Doesn't remember    Plavix  [Clopidogrel  Bisulfate] Rash    Medications   Current Facility-Administered Medications:    0.9 %  sodium chloride  infusion (Manually program via Guardrails IV Fluids), , Intravenous, Once, Sundil, Subrina, MD   0.9 %  sodium chloride  infusion, 250 mL, Intravenous, PRN, Sundil, Subrina, MD   Chlorhexidine  Gluconate Cloth 2 % PADS 6 each, 6 each, Topical, Daily, Sundil, Subrina, MD   [START ON 09/17/2024] escitalopram  (LEXAPRO ) tablet 20 mg, 20 mg, Oral, Daily, Sundil, Subrina, MD   famotidine (PEPCID) IVPB 20 mg premix, 20 mg, Intravenous, Q24H, Sundil, Subrina, MD   feeding supplement (ENSURE PLUS HIGH PROTEIN) liquid 237 mL, 237 mL, Oral, TID BM, Sundil, Subrina, MD   levalbuterol (XOPENEX) nebulizer solution 0.63 mg, 0.63 mg, Nebulization, Q6H PRN, Sundil, Subrina, MD   [START ON 09/17/2024] methylPREDNISolone  sodium succinate (SOLU-MEDROL ) 125 mg/2 mL injection 60 mg, 60 mg, Intravenous, Q6H, Sundil, Subrina, MD   sodium bicarbonate  150 mEq in dextrose  5 % 1,150 mL infusion, , Intravenous, Continuous, Sundil, Subrina, MD, Last Rate: 125 mL/hr at 09/16/24 2004, New Bag at 09/16/24 2004   sodium chloride  flush (NS) 0.9 % injection 3 mL, 3 mL, Intravenous, Q12H, Sundil, Subrina, MD   sodium chloride  flush (NS) 0.9 % injection 3 mL, 3 mL, Intravenous, Q12H, Sundil, Subrina, MD   sodium chloride  flush (NS) 0.9 % injection 3 mL, 3 mL, Intravenous, PRN, Sundil, Subrina, MD   trimethobenzamide PHYLLISTINE) injection 200 mg, 200 mg, Intramuscular, Q8H PRN, Sundil, Subrina, MD  Current Outpatient Medications:    amLODipine  (NORVASC ) 10 MG tablet, TAKE ONE TABLET ONCE DAILY, Disp: 90 tablet, Rfl: 0   apixaban  (ELIQUIS ) 2.5 MG TABS tablet, Take 1 tablet (2.5 mg total) by mouth 2 (two) times daily., Disp: 180 tablet, Rfl: 0   aspirin  EC 81 MG tablet, Take 1 tablet (81 mg total) by mouth daily. Swallow whole., Disp: 30 tablet, Rfl: 5   calcitRIOL (ROCALTROL) 0.25 MCG capsule,  Take 0.25 mcg by mouth 2 (two) times a week., Disp: , Rfl:    colchicine  0.6 MG tablet, TAKE ONE TABLET ONCE DAILY, Disp: 90 tablet, Rfl: 0   diclofenac  Sodium (VOLTAREN ) 1 % GEL, Apply 2 g topically 4 (four) times daily as needed (pain)., Disp: , Rfl:    diltiazem  (CARDIZEM  CD) 240 MG 24 hr capsule, Take 240 mg by mouth daily., Disp: , Rfl:    escitalopram  (LEXAPRO ) 20 MG tablet, TAKE ONE TABLET ONCE DAILY, Disp: 90 tablet, Rfl: 0   ezetimibe  (ZETIA ) 10 MG tablet, Take 1 tablet (10 mg total) by mouth daily., Disp: 90 tablet, Rfl: 0   fenofibrate  (TRICOR ) 145 MG tablet, Take 1 tablet (145 mg total) by mouth daily., Disp: 90 tablet, Rfl: 3   flecainide  (TAMBOCOR ) 50 MG tablet, Take 50 mg by mouth 2 (two) times daily., Disp: , Rfl:    lisinopril  (ZESTRIL ) 10 MG tablet, Take 1 tablet (10 mg total) by mouth daily., Disp: 90 tablet, Rfl: 0   pantoprazole  (PROTONIX ) 40 MG tablet, TAKE ONE TABLET ONCE DAILY, Disp: 90 tablet, Rfl: 0   zinc  oxide 20 % ointment, Apply 1 Application topically as needed for irritation., Disp: , Rfl:    alendronate  (FOSAMAX ) 70 MG tablet, TAKE ONE TABLET ONCE WEEKLY (Patient not taking: Reported on 09/16/2024), Disp: 12 tablet, Rfl: 0   furosemide  (LASIX ) 40 MG tablet, Take 1 tablet (40 mg total) by mouth daily as needed for fluid or edema. (Patient not taking: Reported on 09/16/2024), Disp: , Rfl:    sodium bicarbonate  650 MG tablet, Take 1 tablet (650 mg total) by mouth 2 (two) times daily. (Patient not taking: Reported on 09/16/2024), Disp: 30 tablet, Rfl: 0  Vitals   Vitals:   09/16/24 1857 09/16/24 1900 09/16/24 1910 09/16/24 1912  BP: 106/76 (!) 79/58  107/81  Pulse: 84 83  85  Resp: (!) 22 (!) 22    Temp: 98.2 F (36.8 C)   98 F (36.7 C)  TempSrc: Oral   Oral  SpO2:  100%  100%  Weight:   41.7 kg   Height:   5' 4 (1.626 m)     Body mass index is 15.78 kg/m.   Physical Exam   Constitutional: Appears elderly and chronically ill Neurologic Examination     Neuro: Mental Status: Patient is awake, alert, she is densely aphasic, though can follow some commands. Cranial Nerves: II: Visual Fields are full. Pupils are equal, round, and reactive to light.   III,IV, VI: EOMI without ptosis or diploplia.  V: Facial sensation is symmetric to temperature VII: Facial movement with mild right facial weakness VIII: hearing is intact to voice X: Uvula elevates symmetrically XII: tongue is midline without atrophy or fasciculations.  Motor: She has very little movement of her right upper extremity Sensory: Sensation is symmetric to light touch and temperature in the arms and legs. Cerebellar: FNF and HKS are intact bilaterally  Labs/Imaging/Neurodiagnostic studies   CBC:  Recent Labs  Lab 10-07-2024 1554 09/16/24 1639  WBC 53.7* 54.1*  HGB 4.7* 4.6*  HCT 17.3* 16.5*  MCV 72* 69.6*  PLT 973* 902*   Basic Metabolic Panel:  Lab Results  Component Value Date   NA 126 (L) 09/16/2024   K 5.8 (H) 09/16/2024   CO2 11 (L) 09/16/2024   GLUCOSE 90 09/16/2024   BUN 76 (H) 09/16/2024   CREATININE 2.65 (H) 09/16/2024   CALCIUM  7.5 (L) 09/16/2024   GFRNONAA 18 (L) 09/16/2024   GFRAA 73 11/24/2019   Lipid Panel:  Lab Results  Component Value Date   LDLCALC 89 10/09/2022   HgbA1c:  Lab Results  Component Value Date   HGBA1C 4.9 11/04/2021   Urine Drug Screen:     Component Value Date/Time   LABOPIA POSITIVE (A) 01/29/2024 0810   COCAINSCRNUR NONE DETECTED 01/29/2024 0810   LABBENZ NONE DETECTED 01/29/2024 0810   AMPHETMU NONE DETECTED 01/29/2024 0810   THCU NONE DETECTED 01/29/2024 0810   LABBARB NONE DETECTED 01/29/2024 0810    Alcohol  Level     Component Value Date/Time   ETH <15 09/16/2024 1800   INR  Lab Results  Component Value Date   INR 2.2 (H) 09/16/2024   APTT  Lab Results  Component Value Date   APTT 48 (H) 09/16/2024    CT Head without contrast(Personally reviewed): Negative  CT angio Head and Neck with  contrast(Personally reviewed): Negative  ASSESSMENT   Genie B Cimino is a 73 y.o. female with severe anemia who developed worsened right arm weakness and severe aphasia.  She does have severe anemia, however she has had severe stable anemia for multiple days without the symptoms, so I do suspect the abrupt onset of symptoms is most likely an acute ischemic stroke.  She is unfortunately not a candidate for any type of thrombolytics given her recent bleeding.  I would favor transfusion with a goal hemoglobin of at least seven.  Given how severe her anemia is, I am hesitant to recommend any type of antiplatelet or anticoagulant, though her platelets of nearly 1 million could be some risk for stroke.  RECOMMENDATIONS  Transfuse to hemoglobin of at least seven MRI brain Echo, telemetry Lipids, A1c PT, OT, ST Stroke team to follow ______________________________________________________________________   Signed, Aisha Seals, MD [facility]

## 2024-09-16 NOTE — H&P (Addendum)
 History and Physical    [name] FMW:[id number] DOB: 1951-11-11 DOA: 09/16/2024  PCP: Stacey Marry Lenis, FNP   Patient coming from: Home   Chief Complaint:  Chief Complaint  Patient presents with   Altered Mental Status   ED TRIAGE note: Pt bib family with reports of decreased PO intake for the last several days. Today patient has been confused, unable to state family names. Recently treated for UTI. Pt denies any pain. R sided deficits from previous CVA.             HPI:  [name] is a 73 y.o. female with medical history significant of chronic leukocytosis, thrombocytosis, polycythemia vera, chronic hematuria, paroxysmal atrial fibrillation on anticoagulation, CVA with right-sided hemiparesis, dysphagia, peripheral artery disease with multiple prior stent, recent left subclavian stent 02/2024 for dry gangrene of the left hand digits, right lower extremity AKA, CAD, carotid artery disease, heart failure preserved EF, mitral regurgitation, chronic hypoxic respiratory failure 2 L oxygen at baseline, CKD 3A, essential hypertension, PVC, mood disorder, essential hypertension, GERD, reactive airway disease (unknown if patient has COPD or asthma) and hyperlipidemia presented emergency department complaining of dizziness, poor appetite, fatigue, low energy, and hematuria for 1 week and patient seen by urologist 3 days ago for hematuria that time it was resolved, patient reported that urology stated patient has a mass in the bladder however per patient they are continuing Eliquis  and aspirin .  During my evaluation at the bedside patient is alert oriented however does not seems very interested with any conversation having a flat and blunted affect.  Daughter at the bedside reported that for last few days patient has poor appetite not eating and drinking well while he ate some bite-size food.  Patient does not have any hematuria after placing the Foley catheter in the ED.  Daughter  reported no other source of bleeding likely GI source.  Patient reported feeling generally very tired and weak.  Denies any chest pain, shortness of breath, and palpitation.  Denies any left-sided upper extremity weakness, dysarthria and dysphagia.  No other complaint at this time.  ED course: While patient in the ED certainly reported that she is having trouble speaking and left-sided upper extremity weakness which is why code stroke being activated and patient had a CTA head and head CT which is unremarkable.  ED physician already discussed with neurology Dr. Hillman stated that there is no concern for CVA and recommended to treat patient for anemia.  Dr. Carmelo also spoke with oncology Dr. Ileana given patient has significant leukocytosis and thrombocytosis per oncology elevated WBC and platelet count patient with history of polycythemia vera recommended it is okay to treat with blood transfusion for anemia.  Given patient has hyponatremia and hypokalemia in conjunction and hypotension concern for adrenal crisis and patient has been given Solu-Medrol  100 mg in the ED.  At presentation to ED patient found hypotensive blood pressure 79/58 otherwise hemodynamically stable.  O2 sat 100% on 2 L.  Afebrile. CBC showing hemoglobin 4.6 (baseline hemoglobin between 8 to send 9), elevated WBC count 54 (baseline WBC count around 21-24, elevated platelet count 902 (baseline platelets around 350-446). CMP showing low sodium 126, elevated potassium 5.8, elevated creatinine 2.65, elevated AST/ALT/ALP, normal bilirubin.  Low bicarb 11 elevated and normal anion gap 15. Pending UA, PT/INR, blood alcohol  level.  CT head no acute intracranial abnormality. CT angio head and neck findings are following: MPRESSION: 1. No acute intracranial abnormality.  ASPECTS 10/10 2. Remote lacunar  infarcts in the right caudate head and internal capsule. 3. Moderately advanced confluent periventricular and scattered  subcortical white matter hypoattenuation, likely sequelae of chronic microvascular ischemia. 4. Atherosclerotic calcifications in the cavernous carotid arteries bilaterally and at the dural margin of both vertebral arteries. No hyperdense vessel is present. The pertinent results were texted to Dr. Michaela via the [facility] system at 6:21 pm.  In the ED patient has been given Solu-Medrol , calcium  gluconate, 1 L of NS bolus and 2 unit of blood transfusion has been ordered.  Hospitalist has been consulted for further evaluation management of acute on chronic anemia in the setting of persistent hematuria, thrombocytosis, leukocytosis, hyponatremia, hyperkalemia, AKI, transaminitis, metabolic acidosis.  Spoke with on-call urology Dr. Lyle Civil recommended to place a Foley catheter and see if patient continues to have hematuria now and urology will see for consult in the daytime.   Significant labs in the ED: Lab Orders         Urine Culture (for pregnant, neutropenic or urologic patients or patients with an indwelling urinary catheter)         Culture, blood (Routine X 2) w Reflex to ID Panel         Comprehensive metabolic panel         CBC         Urinalysis, Routine w reflex microscopic -Urine, Clean Catch         Differential         Magnesium          Protime-INR         APTT         Ethanol         CBC         Creatinine, urine, random         Sodium, urine, random         Urinalysis, Routine w reflex microscopic -Urine, Clean Catch         Cortisol-am, blood         TSH         T4, free         Comprehensive metabolic panel         Osmolality, urine         Osmolality         Occult blood card to lab, stool         T4, free         Hepatitis panel, acute         CBG monitoring, ED         CBG monitoring, ED         I-stat chem 8, ED         CBG monitoring, ED       Review of Systems:  Review of Systems  Constitutional:  Positive for malaise/fatigue and weight  loss. Negative for chills and fever.  Respiratory:  Negative for cough and shortness of breath.   Cardiovascular:  Negative for chest pain and palpitations.  Gastrointestinal:  Negative for abdominal pain, blood in stool, constipation, diarrhea, heartburn, melena, nausea and vomiting.  Genitourinary:  Negative for dysuria and urgency.  Musculoskeletal:  Negative for myalgias.  Neurological:  Positive for dizziness. Negative for tingling, tremors, speech change, focal weakness, loss of consciousness, weakness and headaches.  Psychiatric/Behavioral:  Negative for depression. The patient does not have insomnia.     Past Medical History:  Diagnosis Date   Acute blood loss anemia 05/04/2020   Acute ischemic stroke (  HCC)    Acute respiratory failure with hypoxia (HCC) 01/29/2024   AKI (acute kidney injury)    Allergy ?, Don't remember   Arthritis    fingers, some in my knees (01/28/2016)   Atrial fibrillation with RVR, hx of PAF previously 04/27/2013   Paroxysmal atrial fibrillation     Formatting of this note might be different from the original.  Overview:   Paroxysmal atrial fibrillation     Last Assessment & Plan:   History of PAF maintaining sinus rhythm on flecainide  and Eliquis  oral anti-coagulation     Blood transfusion without reported diagnosis    During surgery   CAD (coronary artery disease), per cath 04/27/13, non obstructive disease, EF 65-70% 05/11/2013   Carotid disease, bilateral 09/20/2013   Lt carotid 50-69% stentosis, less on rt   Cataract    Don't remember   Cellulitis of left leg 01/16/2022   Closed fracture of left tibial plateau 10/23/2018   Depression Don't remember   Dysrhythmia    Elevated troponin 05/04/2020   Encounter for support and coordination of transition of care 11/11/2021   Fatty liver    Fracture of ankle 10/23/2018   GERD (gastroesophageal reflux disease)    Glaucoma    H/O cardiovascular stress test 12/27/2009   negative for ischemia   Headache     occasional since eye pressure regulated (01/28/2016)   Hospital discharge follow-up 10/15/2021   Hyperlipidemia    Hypertension    Hypertensive crisis 01/08/2016   Formatting of this note might be different from the original.  hypertension     Last Assessment & Plan:   BP at goal and significantly improved from previous visit. Patient tolerating valsartan  and chlorthalidone  very well. Reports 1 episode of symptomatic hypotension that resolved after few hours. Denies falls or increase fatigue. Will continue current therapy without changes and follow up as nee   Hypertensive emergency 11/28/2022   Leukocytosis 07/15/2015   Migraine    stopped w/laser holes to relieve pressure in my eyes; had them 3-4 times/wk before taht (01/28/2016)   Mitral regurgitation,2+ by cath 05/11/2013   NSTEMI (non-ST elevated myocardial infarction) (HCC) 04/27/2013   Osteoporosis IDK   Pain    BOTH KNEES - PT HAS TORN MENISCUS LEFT KNEE   Pain in both wrists 09/03/2021   Pain in left knee 10/03/2018   Paroxysmal atrial fibrillation (HCC)    hx of a fib on ASA  AND ELIQUIS     Pelvic fracture (HCC) 09/09/2018   Polycythemia vera (HCC) 08/20/2015   JAK-2 positive 07/19/15   Prosthesis adjustment 01/13/2019   PVD (peripheral vascular disease) with claudication 07/21/2006   previous L ext iliac stent and rt SFA stent, known occluded Lt SFA   S/P cardiac cath 04/27/2013   NON OBSTRUCTIVE DISEASE, 2+ mr, ef 65-70%   Sacral fracture, closed (HCC) 05/04/2020   Statin intolerance    Stroke (HCC) 12/21/2004   SWELLING ALL OVER AND RT SIDE OF FACE DRAWN AND SPEECH SLURRED AND NUMBNESS ON RIGHT SIDE-- ALL RESOLVED   TIA (transient ischemic attack)    Trauma 05/04/2020    Past Surgical History:  Procedure Laterality Date   ABDOMINAL HYSTERECTOMY  1977   After childbirth   AMPUTATION Right 05/21/2017   Procedure: AMPUTATION ABOVE KNEE;  Surgeon: Oris Krystal FALCON, MD;  Location: [facility] OR;  Service: Vascular;  Laterality:  Right;   CARDIAC CATHETERIZATION  04/27/2013   non occlusive disease with mild to mod. calcified lesions in ostial LM and  proximal LAD and moderate prox. RC AND DISTAL AV GROOVE LCX, ef   CATARACT EXTRACTION W/PHACO Right 03/21/2015   Procedure: CATARACT EXTRACTION PHACO AND INTRAOCULAR LENS PLACEMENT RIGHT EYE;  Surgeon: Lynwood Hermann, MD;  Location: [facility] ORS;  Service: Ophthalmology;  Laterality: Right;  CDE:5.10   CATARACT EXTRACTION W/PHACO Left 05/16/2015   Procedure: CATARACT EXTRACTION PHACO AND INTRAOCULAR LENS PLACEMENT (IOC);  Surgeon: Lynwood Hermann, MD;  Location: [facility] ORS;  Service: Ophthalmology;  Laterality: Left;  CDE 5.57   CESAREAN SECTION  12/22/1975   COLONOSCOPY N/A 09/11/2016   Procedure: COLONOSCOPY;  Surgeon: Claudis RAYMOND Rivet, MD;  Location: [facility] ENDO SUITE;  Service: Endoscopy;  Laterality: N/A;  730-moved to 1:00 Ann notified pt   EMBOLECTOMY Right 02/01/2016   Procedure: Thrombectomy of Right Femoral-Popliteal Bypass Graft; Endarterectomy of Right Below Knee Popliteal Artery and Tibial Peroneal Trunk with Bovine Pericardium Patch Angioplasty.;  Surgeon: Lonni GORMAN Blade, MD;  Location: [facility] OR;  Service: Vascular;  Laterality: Right;   EYE SURGERY     FEMORAL-POPLITEAL BYPASS GRAFT Right 01/31/2016   Procedure: BYPASS GRAFT RIGHT COMMON  FEMORALTO BELOW KNEE POPLITEAL ARTERY BYPASS GRAFT USING PROPATEN GORTEX GRAFT;  Surgeon: Lynwood JONETTA Collum, MD;  Location: [facility] OR;  Service: Vascular;  Laterality: Right;   FEMORAL-POPLITEAL BYPASS GRAFT Right 01/09/2017   Procedure: THROMBECTOMY OF RIGHT FEMORAL-POPLITEAL  ARTERY BYPASS GRAFT; THROMBECTOMY RIGHT  TIBIAL VESSELS;  Surgeon: Carlin FORBES Haddock, MD;  Location: [facility] OR;  Service: Vascular;  Laterality: Right;   FEMORAL-POPLITEAL BYPASS GRAFT Right 05/17/2017   Procedure: RIGHT FEMORAL-TIBIAL PERONEAL TRUNK ARTERY BYPASS GRAFT USING 28mmX80cm PROPATEN GRAFT WITH REMOVABLE RING;  Surgeon: Oris Krystal FALCON, MD;  Location: [facility] OR;  Service:  Vascular;  Laterality: Right;   FRACTURE SURGERY     ILIAC ARTERY STENT  07/21/2006   external iliac and Rt SFA stent 07/2006 AND THE LEFT WAS IN 2006   KNEE ARTHROSCOPY WITH MEDIAL MENISECTOMY Left 03/27/2014   Procedure: left knee arthorscopy with medial chondraplasty of the medial femoral and patella, medial microfracture technique of medial femoral condyl;  Surgeon: Tanda DELENA Heading, MD;  Location: [facility] ORS;  Service: Orthopedics;  Laterality: Left;   LEFT HEART CATHETERIZATION WITH CORONARY ANGIOGRAM Right 04/27/2013   Procedure: LEFT HEART CATHETERIZATION WITH CORONARY ANGIOGRAM;  Surgeon: Alm LELON Clay, MD;  Location: [facility] CATH LAB;  Service: Cardiovascular;  Laterality: Right;   LOWER EXTREMITY ANGIOGRAM Right 01/31/2016   Procedure: INTRAOP RIGHT LOWER EXTREMITY ANGIOGRAM;  Surgeon: Lynwood JONETTA Collum, MD;  Location: [facility] OR;  Service: Vascular;  Laterality: Right;   ORIF WRIST FRACTURE Right 12/21/1981   OVARIAN CYST REMOVAL Right    PATCH ANGIOPLASTY Right 01/31/2016   Procedure: RIGHT COMMON FEMORAL AND PROFUNDA FEMORIS ENDARECTOMY WITH PATCH ANGIOPLASTY;  Surgeon: Lynwood JONETTA Collum, MD;  Location[facility] OR;  Service: Vascular;  Laterality: Right;   PATCH ANGIOPLASTY Right 01/09/2017   Procedure: PATCH ANGIOPLASTY RIGHT POPLITEAL ARTERY BYPASS GRAFT;  Surgeon: Carlin FORBES Haddock, MD;  Location: [facility] OR;  Service: Vascular;  Laterality: Right;   PERIPHERAL VASCULAR CATHETERIZATION N/A 06/27/2015   Procedure: Lower Extremity Angiography;  Surgeon: Dorn JINNY Lesches, MD;  Location: [facility] INVASIVE CV LAB;  Service: Cardiovascular;  Laterality: N/A;   PERIPHERAL VASCULAR CATHETERIZATION Bilateral 01/29/2016   Procedure: Lower Extremity Angiography;  Surgeon: Deatrice DELENA Cage, MD;  Location: [facility] INVASIVE CV LAB;  Service: Cardiovascular;  Laterality: Bilateral;   PERIPHERAL VASCULAR CATHETERIZATION N/A 01/29/2016   Procedure: Abdominal Aortogram;  Surgeon: Deatrice DELENA Cage,  MD;  Location: [facility] INVASIVE CV LAB;   Service: Cardiovascular;  Laterality: N/A;   REFRACTIVE SURGERY Bilateral    6 in one eye; 7 in the other; to relieve pressure; not glaucoma (01/28/2016)   SFA Right 06/27/2015   overlapping Bahn covered stents   TUBAL LIGATION  12/21/1981   VEIN HARVEST Left 05/17/2017   Procedure: LEFT LEG GREATER SAPHENOUS VEIN HARVEST;  Surgeon: Oris Krystal FALCON, MD;  Location: [facility] OR;  Service: Vascular;  Laterality: Left;   VEIN REPAIR Right 01/31/2016   Procedure: RIGHT GREATER SAPHENOUS VEIN EXAMNED BUT NOT REMOVED;  Surgeon: Lynwood JONETTA Collum, MD;  Location: [facility] OR;  Service: Vascular;  Laterality: Right;     reports that she quit smoking about 11 years ago. Her smoking use included cigarettes. She started smoking about 56 years ago. She has a 45 pack-year smoking history. She has never used smokeless tobacco. She reports that she does not drink alcohol  and does not use drugs.  Allergies  Allergen Reactions   Atenolol Rash and Itching   Demerol   [Meperidine  Hcl] Itching   Demerol  [Meperidine ] Itching and Other (See Comments)    Unknown, can't remember    Gabapentin  Anxiety and Other (See Comments)    SI and HI   Inderal [Propranolol] Other (See Comments) and Itching    Doesn't remember    Prednisone Other (See Comments)    Muscle spasms   Statins Itching and Other (See Comments)    Simvastatin  - caused severe itching Pravastatin  - caused lesser tiching One type of statin? Caused stroke in 2006, and other symptoms as result   Warfarin And Related Other (See Comments)    Caused nose bleeds   Crestor [Rosuvastatin] Itching and Rash   Docosahexaenoic Acid (Dha) (Fish)     nosebleeds   Lactose Intolerance (Gi) Other (See Comments)    Bloating, gas   Lipitor [Atorvastatin ] Rash   Lovaza [Omega-3-Acid Ethyl Esters (Fish)] Other (See Comments)    nosebleeds    Penicillins Hives, Rash and Other (See Comments)    Immediate rash, facial/tongue/throat swelling, SOB or lightheadedness with  hypotension Questionable high fever    Pravastatin  Itching and Rash   Simvastatin  Itching, Rash and Other (See Comments)   Trilipix [Choline Fenofibrate ] Other (See Comments)    Doesn't remember    Warfarin Other (See Comments)    nosebleeds   Hydralazine  Swelling   Effexor [Venlafaxine Hcl] Other (See Comments)    Doesn't remember    Nexium [Esomeprazole Magnesium ] Rash   Percocet [Oxycodone -Acetaminophen ] Other (See Comments)    Doesn't remember    Plavix  [Clopidogrel  Bisulfate] Rash    Family History  Problem Relation Age of Onset   CAD Mother    Lung cancer Mother    Heart attack Mother    Miscarriages / Stillbirths Mother    Obesity Mother    Stroke Mother    Vision loss Mother    CAD Father    Heart disease Father    Heart attack Father 48       died in hes 11's with heart disease   Hearing loss Father    Healthy Sister    Liver cancer Brother    Alcohol  abuse Brother    Arthritis Sister    CAD Brother        has had CABG   CAD Brother    Cancer Maternal Aunt    Cancer Sister     Prior to Admission medications   Medication Sig Start  Date End Date Taking? Authorizing Provider  alendronate  (FOSAMAX ) 70 MG tablet TAKE ONE TABLET ONCE WEEKLY 07/24/24   Dettinger, Fonda LABOR, MD  amLODipine  (NORVASC ) 10 MG tablet TAKE ONE TABLET ONCE DAILY 07/24/24   Dettinger, Fonda LABOR, MD  apixaban  (ELIQUIS ) 2.5 MG TABS tablet Take 1 tablet (2.5 mg total) by mouth 2 (two) times daily. 05/03/24   St Morton Sebastian Pool, NP  aspirin  EC 81 MG tablet Take 1 tablet (81 mg total) by mouth daily. Swallow whole. 09/11/24   Ricky Fines, MD  calcitRIOL (ROCALTROL) 0.25 MCG capsule Take 0.25 mcg by mouth 2 (two) times a week. 06/19/24   [provider]  Calcium  Carbonate (CALTRATE 600 PO) Take by mouth. 04/04/24   [provider]  colchicine  0.6 MG tablet TAKE ONE TABLET ONCE DAILY 07/24/24   Dettinger, Fonda LABOR, MD  diclofenac  Sodium (VOLTAREN ) 1 % GEL Apply 2 g topically 4  (four) times daily as needed (pain). 02/02/24   Ricky Fines, MD  diltiazem  (CARDIZEM  CD) 240 MG 24 hr capsule Take 240 mg by mouth daily. 07/24/24   [provider]  escitalopram  (LEXAPRO ) 20 MG tablet TAKE ONE TABLET ONCE DAILY 07/24/24   Dettinger, Fonda LABOR, MD  ezetimibe  (ZETIA ) 10 MG tablet Take 1 tablet (10 mg total) by mouth daily. 05/03/24   St Morton Sebastian Pool, NP  fenofibrate  (TRICOR ) 145 MG tablet Take 1 tablet (145 mg total) by mouth daily. 01/13/24   West, Katlyn D, NP  furosemide  (LASIX ) 40 MG tablet Take 1 tablet (40 mg total) by mouth daily as needed for fluid or edema. 09/03/24   Ricky Fines, MD  lisinopril  (ZESTRIL ) 10 MG tablet Take 1 tablet (10 mg total) by mouth daily. 08/08/24   Dettinger, Fonda LABOR, MD  pantoprazole  (PROTONIX ) 40 MG tablet TAKE ONE TABLET ONCE DAILY 07/24/24   Dettinger, Fonda LABOR, MD  sodium bicarbonate  650 MG tablet Take 1 tablet (650 mg total) by mouth 2 (two) times daily. 09/03/24   Ricky Fines, MD  zinc  oxide 20 % ointment Apply 1 Application topically as needed for irritation.    [provider]     Physical Exam: Vitals:   09/16/24 1912 09/16/24 2150 09/16/24 2217 09/16/24 2239  BP: 107/81 (!) 113/53 (!) 115/42 (!) 115/58  Pulse: 85 84 83 84  Resp:  (!) 22 20 (!) 25  Temp: 98 F (36.7 C) 97.9 F (36.6 C) 97.9 F (36.6 C) 98.1 F (36.7 C)  TempSrc: Oral Oral Oral Oral  SpO2: 100% 100% 100% 100%  Weight:      Height:        Physical Exam Vitals and nursing note reviewed.  Constitutional:      Appearance: She is ill-appearing.     Comments: Cachectic appearance  HENT:     Mouth/Throat:     Mouth: Mucous membranes are dry.  Eyes:     Pupils: Pupils are equal, round, and reactive to light.  Cardiovascular:     Rate and Rhythm: Normal rate and regular rhythm.     Pulses: Normal pulses.     Heart sounds: Normal heart sounds.  Pulmonary:     Effort: Pulmonary effort is normal.     Breath sounds: Normal breath  sounds. No wheezing.  Abdominal:     Palpations: Abdomen is soft.  Musculoskeletal:     Cervical back: Normal range of motion and neck supple.     Left lower leg: No edema.  Skin:    General:  Skin is dry.     Capillary Refill: Capillary refill takes less than 2 seconds.  Neurological:     Mental Status: She is alert and oriented to person, place, and time.  Psychiatric:        Attention and Perception: Attention normal.        Mood and Affect: Mood is not depressed. Affect is blunt and flat. Affect is not tearful.        Behavior: Behavior normal.        Thought Content: Thought content normal.        Cognition and Memory: Cognition normal.        Judgment: Judgment normal.      Labs on Admission: I have personally reviewed following labs and imaging studies  CBC: Recent Labs  Lab 09/13/24 1554 09/16/24 1639  WBC 53.7* 54.1*  HGB 4.7* 4.6*  HCT 17.3* 16.5*  MCV 72* 69.6*  PLT 973* 902*   Basic Metabolic Panel: Recent Labs  Lab 09/16/24 1639 09/16/24 1800  NA 126*  --   K 5.8*  --   CL 100  --   CO2 11*  --   GLUCOSE 90  --   BUN 76*  --   CREATININE 2.65*  --   CALCIUM  7.5*  --   MG  --  1.5*   GFR: Estimated Creatinine Clearance: 12.4 mL/min (A) (by C-G formula based on SCr of 2.65 mg/dL (H)). Liver Function Tests: Recent Labs  Lab 09/16/24 1639  AST 265*  ALT 164*  ALKPHOS 82  BILITOT 0.8  PROT 5.1*  ALBUMIN  2.5*   No results for input(s): LIPASE, AMYLASE in the last 168 hours. No results for input(s): AMMONIA in the last 168 hours. Coagulation Profile: Recent Labs  Lab 09/16/24 1800  INR 2.2*   Cardiac Enzymes: No results for input(s): CKTOTAL, CKMB, CKMBINDEX, TROPONINI, TROPONINIHS in the last 168 hours. BNP (last 3 results) No results for input(s): BNP in the last 8760 hours. HbA1C: No results for input(s): HGBA1C in the last 72 hours. CBG: Recent Labs  Lab 09/16/24 1650 09/16/24 1751 09/16/24 1834  GLUCAP 79  76 89   Lipid Profile: No results for input(s): CHOL, HDL, LDLCALC, TRIG, CHOLHDL, LDLDIRECT in the last 72 hours. Thyroid  Function Tests: Recent Labs    09/16/24 1800  FREET4 0.80   Anemia Panel: No results for input(s): VITAMINB12, FOLATE, FERRITIN, TIBC, IRON, RETICCTPCT in the last 72 hours. Urine analysis:    Component Value Date/Time   COLORURINE YELLOW 09/16/2024 1944   APPEARANCEUR CLOUDY (A) 09/16/2024 1944   LABSPEC 1.017 09/16/2024 1944   PHURINE 5.0 09/16/2024 1944   GLUCOSEU NEGATIVE 09/16/2024 1944   HGBUR MODERATE (A) 09/16/2024 1944   BILIRUBINUR NEGATIVE 09/16/2024 1944   KETONESUR NEGATIVE 09/16/2024 1944   PROTEINUR 100 (A) 09/16/2024 1944   NITRITE NEGATIVE 09/16/2024 1944   LEUKOCYTESUR LARGE (A) 09/16/2024 1944    Radiological Exams on Admission: I have personally reviewed images US  Abdomen Limited RUQ (LIVER/GB) Result Date: 09/16/2024 CLINICAL DATA:  Transaminitis. EXAM: ULTRASOUND ABDOMEN LIMITED RIGHT UPPER QUADRANT COMPARISON:  None Available. FINDINGS: Gallbladder: Multiple shadowing, echogenic, mobile gallstones are seen within the gallbladder lumen. The largest measures approximately 1.3 cm. Gallbladder wall thickening is seen (7.0 mm). Pericholecystic fluid is also present. No sonographic Murphy sign noted by sonographer. Common bile duct: Diameter: 5.1 mm Liver: No focal lesion identified. Within normal limits in parenchymal echogenicity. Portal vein is patent on color Doppler imaging with normal direction of  blood flow towards the liver. Other: Of incidental note is the presence of a right sided pleural effusion. IMPRESSION: 1. Cholelithiasis with additional findings consistent with sequelae associated with acute cholecystitis. 2. Right pleural effusion. Electronically Signed   By: Suzen Dials M.D.   On: 09/16/2024 23:35   CT ANGIO HEAD NECK W WO CM (CODE STROKE) Result Date: 09/16/2024 EXAM: CTA HEAD AND NECK WITH AND  WITHOUT 09/16/2024 06:24:15 PM TECHNIQUE: CTA of the head and neck was performed with and without the administration of 50 mL of intravenous contrast (iohexol  (OMNIPAQUE ) 350 MG/ML injection 75 mL IOHEXOL  350 MG/ML SOLN). Multiplanar 2D and/or 3D reformatted images are provided for review. Automated exposure control, iterative reconstruction, and/or weight based adjustment of the mA/kV was utilized to reduce the radiation dose to as low as reasonably achievable. Stenosis of the internal carotid arteries measured using NASCET criteria. COMPARISON: CT angio head and neck 02/09/2024 at [facility] and CTA head and neck 03/04/2021 at [facility]. CLINICAL HISTORY: Neuro deficit, acute, stroke suspected. FINDINGS: CTA NECK: AORTIC ARCH AND ARCH VESSELS: Extensive atherosclerotic changes are again noted at the aortic arch and great vessel origins. Severe stenosis of the left subclavian artery proximal to the vertebral artery has again noted. No dissection or arterial injury. No significant stenosis of the brachiocephalic artery. CERVICAL CAROTID ARTERIES: Atherosclerotic calcifications are present throughout the right common carotid artery without a significant stenosis relative to the more distal vessel. Atherosclerotic calcifications are present in the proximal right ICA without significant stenosis. Diffuse mural calcification are present throughout the left common carotid artery without significant stenosis. Patient motion is present through the area of stenosis previously noted in the proximal left ICA. The stenosis is not significantly changed. No dissection or arterial injury. CERVICAL VERTEBRAL ARTERIES: The left vertebral artery is occluded in the V1 and proximal left V2 segments. It is partially reconstituted in the neck. The right vertebral artery shows no dissection, arterial injury, or significant stenosis in its cervical segments. LUNGS AND MEDIASTINUM: Unremarkable. SOFT TISSUES: No acute  abnormality. BONES: Multilevel degenerative changes are again noted in the cervical spine. CTA HEAD: ANTERIOR CIRCULATION: Atherosclerotic calcifications are present within the cavernous internal carotid arteries bilaterally without significant stenoses through the ICA terminae. New left A1 segment is hypoplastic. No significant stenosis of the middle cerebral arteries. No aneurysm. POSTERIOR CIRCULATION: A fetal-type right cerebral artery is again noted. Moderate narrowing is present in the left P1 segment. Stable mild narrowing in the mid basilar artery is stable. Moderate narrowing is present in the distal vertebral arteries bilaterally. No aneurysm. OTHER: ECA branch vessels are opacified bilaterally. No dural venous sinus thrombosis on this non-dedicated study. IMPRESSION: 1. No large vessel occlusion or aneurysm in the head or neck. 2. 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 3. 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 4. Moderate narrowing in the left P1 segment, distal vertebral arteries bilaterally, and stable mild narrowing in the mid basilar artery. 5. Extensive atherosclerotic changes as described. Findings were communicated to doctor Michaela via the amion system at 06:48 pm Electronically signed by: Lonni Necessary MD 09/16/2024 07:00 PM EDT RP Workstation: [id number]   CT HEAD CODE STROKE WO CONTRAST Result Date: 09/16/2024 EXAM: CT HEAD WITHOUT CONTRAST 09/16/2024 06:12:59 PM TECHNIQUE: CT of the head was performed without the administration of intravenous contrast. Automated exposure control, iterative reconstruction, and/or weight based adjustment of  the mA/kV was utilized to reduce the radiation dose to as low as reasonably achievable. The study is mildly degraded by patient motion. COMPARISON: CT head without contrast 01/11/2024. MR head without contrast 03/09/2024 at  [facility]. CLINICAL HISTORY: Neuro deficit, acute, stroke suspected. FINDINGS: BRAIN AND VENTRICLES: No acute hemorrhage. No evidence of acute infarct. No hydrocephalus. No extra-axial collection. No mass effect or midline shift. Remote lacunar infarcts are again noted within the right caudate head and internal capsule. Confluent Periventricular and scattered subcortical white matter hypoattenuation is moderately advanced for age. This most likely reflects the sequelae of chronic microvascular ischemia. Atherosclerotic calcifications are present in the cavernous carotid arteries bilaterally and at the dural margin of both vertebral arteries. No hyperdense vessel is present. ORBITS: No acute abnormality. SINUSES: No acute abnormality. SOFT TISSUES AND SKULL: No acute soft tissue abnormality. No skull fracture. [place] stroke program early CT (aspect) score: Ganglionic (caudate, ic, Lentiform Nucleus, insula, M1-m3): 7 Supraganglionic (m4-m6): 3 Total: 10 IMPRESSION: 1. No acute intracranial abnormality.  ASPECTS 10/10 2. Remote lacunar infarcts in the right caudate head and internal capsule. 3. Moderately advanced confluent periventricular and scattered subcortical white matter hypoattenuation, likely sequelae of chronic microvascular ischemia. 4. Atherosclerotic calcifications in the cavernous carotid arteries bilaterally and at the dural margin of both vertebral arteries. No hyperdense vessel is present. The pertinent results were texted to Dr. Michaela via the [facility] system at 6:21 pm. Electronically signed by: Lonni Necessary MD 09/16/2024 06:23 PM EDT RP Workstation: [id number]     EKG: My personal interpretation of EKG shows: Sinus rhythm heart rate 77 prolonged QTc interval 452 ms    Assessment/Plan: Principal Problem:   Symptomatic anemia Active Problems:   Leukocytosis   Hematuria   Bladder mass   Thrombocytosis   Hyponatremia   Transaminitis   Acute kidney injury superimposed on  chronic kidney disease   Peripheral artery disease   Paroxysmal atrial fibrillation (HCC)   Essential hypertension   Generalized anxiety disorder   CKD stage 3b, GFR 30-44 ml/min (HCC)   History of CVA with residual deficit   Hyperkalemia   Metabolic acidosis   Weakness of left upper extremity-resolved   Stenosis of left subclavian artery s/p stent   History of CAD (coronary artery disease)   Chronic diastolic CHF (congestive heart failure) (HCC)   Adrenal crisis   Mood disorder   Chronic hypoxic respiratory failure (HCC)   Reactive airway disease   Prolonged Q-T interval on ECG    Assessment and Plan: Symptomatic anemia-secondary to hematuria History of chronic hematuria and bladder mass History Of polycythemia vera History of chronic leukocytosis and thrombocytosis -Presented to emergency department complaining of persistent hematuria for 3 weeks with associated dizziness, generalized weakness, poor appetite and not feeling well overall. - Further workup in the ED revealed acute development of anemia, persistent leukocytosis, persistent thrombocytosis, hyponatremia, hyperkalemia, metabolic acidosis, AKI, transaminitis.  While in the ED suddenly developed left-sided upper extremity weakness and CT head rule out a stroke.  EDP already discussed with oncology regarding persistent leukocytosis and trouble cytosis recommended in the setting of polycythemia vera it is commonly seen and recommended no restriction for blood transfusion. -CBC showing hemoglobin 4.6, elevated WBC count 54 and elevated platelet count 902.   - Baseline hemoglobin around 8-9 today hemoglobin is 4.6.  In the setting of persistent hematuria.  Patient denies any GI bleed. - Transfusing total 3 units of blood and checking FOBT. -Continue to monitor H&H and transfuse as needed goal  to keep hemoglobin around 8.  Hyponatremia Hyperkalemia -Low sodium 126 and elevated potassium 5.8.  Hyponatremia and hyperkalemia in  the setting of AKI.  Given patient also has metabolic acidosis low bicarb 11 continue sodium bicarb drip which will both improve sodium level and bicarb level.  Pending UA, urine creatinine, urine sodium, serum osmolarity and urine osmolarity.  Checking TSH and free T4.  Transaminitis -Elevated AST to 265, elevated ALT 164.  Normal bilirubin level.  Normal alkaline phosphatase.  Concern for transaminitis in the setting of hypotension and shock liver.  Checking hepatitis panel.  Patient denies any abdominal pain, nausea vomiting. Continue to monitor hepatic function panel.  Obtaining hepatic ultrasound. Addendum Cholelithiasis with acute cholecystitis - Given patient has transaminitis obtain right upper quadrant ultrasound which showing cholelithiasis in addition with acute cholecystitis. -Physical exam no evidence of Murphy sign and abdominal pain. - Starting IV antibiotics and obtaining blood cultures. -Inform general surgery Dr. Teresa to evaluate tomorrow.   Hypotension -  at presentation to ED patient was borderline hypotensive later on developed hypotension blood pressure dropped 79/1:58 liter of NS bolus blood pressure improved to 107/81.  Given patient has hyponatremia and hyperkalemia concern for adrenal crisis started on IV Solu-Medrol  received 100 mg IV followed by continue IV Solu-Medrol  60 mg every 6 hours for 1 day. At this time no concern for sepsis given patient does not have any fever and clinical picture does not have any sign of infection. - Checking morning cortisol, TSH and T4 level.   Acute kidney injury superimposed CKD stage 3B History of chronic hematuria History of bladder nodule-concern for bladder tumor -Patient has history of hematuria for last 2 to 3 months and for last 3 weeks patient reported having gross hematuria which has been resolved now.  Being evaluated by urology outpatient 9/24 and CT abdomen showing 1.3 mm left bladder nodule.  Urology recommended  cystoscopy in 1 week and recommended AZO for bladder pain -CMP showing elevated creatinine 2.65.  Baseline creatinine around 1.2-1.7. -In the ED Foley has been placed which is draining clear urine no evidence of hematuria.  Pending UA, urine creatinine and urine sodium level. -Prerenal acute kidney injury in the setting of acute development of anemia, hypotension and dehydration. - In the ED patient received 1 L of NS bolus continue sodium bicarb drip. - Consulted urology recommended to place a Foley catheter and plan to see tomorrow.   Concern for adrenal crisis -In the setting of hypotension, hyponatremia and hypokalemia concern for adrenal crisis.  Treating with IV Solu-Medrol .  Checking TSH, T4 and morning cortisol level.   Non-anion gap metabolic acidosis -Low bicarb 11.  Patient has history of chronic metabolic acidosis in the setting of advanced CKD.  Normal anion gap.  Starting sodium bicarb drip.  Continue monitor bicarb level.  Worsening metabolic acidosis in the setting of AKI.  Paroxysmal atrial fibrillation Prolonged QTc interval EKG showed normal sinus rhythm heart rate 77 and prolonged QTc  interval 522 milliseconds. Holding Cardizem  in the setting of hypotension.  Holding Eliquis  in the setting of hematuria and low hemoglobin.  Resume once appropriate  History of CVA with right-sided residual effusion Left-sided upper extremity weakness-resolved -Patient has history of CVA with residual right-sided weakness.  In the ED patient suddenly developed left-sided upper extremity due to weakness which has been resolved.  Code stroke activated and CT head CTA head and neck obtained which ruled out acute CVA.  Neurology also has been reviewed imaging and discussed  case with EDP at this time there is no concern for acute CVA. -Hold Eliquis  in the setting of hematuria and low hemoglobin. -Holding ezetimibe  in the setting of transaminitis. Addendum - Per neurology patient might have  acute CVA, recommended to obtain MRI of the brain to rule out acute CVA, check lipid panel, A1c, PT, OT and stroke team will continue to follow.   History of peripheral artery disease multiple stent History of subclavian artery stenosis status post stent 02/2024 Hold Eliquis  and lipid-lowering agent in the setting of transaminitis, hematuria and low hemoglobin  History of CAD Chronic diastolic heart failure -Holding blood pressure regimen in the setting of hypotension and concern for adrenal crisis.  Chronic hypoxic respiratory failure History of reactive airway disease -O2 sat 100% on 2 L oxygen at baseline.  Continue Xopenex as needed  Generalized anxiety disorder - Patient has flat and blunted affect.  Daughter at the bedside reported that patient has poor appetite as well. - Continue Lexapro   History of  poor appetite Cachexia and protein calorie malnutrition -Daughter reported that patient does not have any difficulty swallowing however she has very poor appetite eat bite-size meal.  Continue dysphagia level 1 diet.  Continue protein shakes 3 times daily.   DVT prophylaxis:  SCDs Code Status:  Full Code Diet: Dysphagia diet Family Communication:   Family was present at bedside, at the time of interview. Opportunity was given to ask question and all questions were answered satisfactorily.  Disposition Plan: Continue monitor improvement of hemoglobin and sodium level.  Urology will see tomorrow Consults: Neurology, oncology and urology Admission status:   Inpatient, Step Down Unit  Severity of Illness: The appropriate patient status for this patient is INPATIENT. Inpatient status is judged to be reasonable and necessary in order to provide the required intensity of service to ensure the patient's safety. The patient's presenting symptoms, physical exam findings, and initial radiographic and laboratory data in the context of their chronic comorbidities is felt to place them at high  risk for further clinical deterioration. Furthermore, it is not anticipated that the patient will be medically stable for discharge from the hospital within 2 midnights of admission.   * I certify that at the point of admission it is my clinical judgment that the patient will require inpatient hospital care spanning beyond 2 midnights from the point of admission due to high intensity of service, high risk for further deterioration and high frequency of surveillance required.DEWAINE    Presten Joost, MD [facility]  How to contact the [facility] Attending or Consulting provider 7A - 7P or covering provider during after hours 7P -7A, for this patient.  Check the care team in [facility] and look for a) attending/consulting [facility] provider listed and b) the [facility] team listed Log into [email or web address] and use [facility]'s universal password to access. If you do not have the password, please contact the hospital operator. Locate the [facility] provider you are looking for under [facility] and page to a number that you can be directly reached. If you still have difficulty reaching the provider, please page the [facility] (Director on Call) for the Hospitalists listed on amion for assistance.  09/16/2024, 11:49 PM

## 2024-09-16 NOTE — ED Notes (Signed)
 Daughter states pt cannot get urine rn aware at

## 2024-09-17 ENCOUNTER — Inpatient Hospital Stay (HOSPITAL_COMMUNITY)

## 2024-09-17 ENCOUNTER — Other Ambulatory Visit (HOSPITAL_COMMUNITY)

## 2024-09-17 DIAGNOSIS — M319 Necrotizing vasculopathy, unspecified: Secondary | ICD-10-CM

## 2024-09-17 DIAGNOSIS — I6350 Cerebral infarction due to unspecified occlusion or stenosis of unspecified cerebral artery: Secondary | ICD-10-CM

## 2024-09-17 DIAGNOSIS — R29704 NIHSS score 4: Secondary | ICD-10-CM | POA: Diagnosis not present

## 2024-09-17 DIAGNOSIS — I6389 Other cerebral infarction: Secondary | ICD-10-CM

## 2024-09-17 DIAGNOSIS — I69391 Dysphagia following cerebral infarction: Secondary | ICD-10-CM

## 2024-09-17 DIAGNOSIS — D649 Anemia, unspecified: Secondary | ICD-10-CM | POA: Diagnosis not present

## 2024-09-17 LAB — CBC
HCT: 24.4 % — ABNORMAL LOW (ref 36.0–46.0)
HCT: 23.4 % — ABNORMAL LOW (ref 36.0–46.0)
HCT: 24.7 % — ABNORMAL LOW (ref 36.0–46.0)
HCT: 24.7 % — ABNORMAL LOW (ref 36.0–46.0)
Hemoglobin: 7.6 g/dL — ABNORMAL LOW (ref 12.0–15.0)
Hemoglobin: 7.5 g/dL — ABNORMAL LOW (ref 12.0–15.0)
Hemoglobin: 8 g/dL — ABNORMAL LOW (ref 12.0–15.0)
Hemoglobin: 8.1 g/dL — ABNORMAL LOW (ref 12.0–15.0)
MCH: 23.1 pg — ABNORMAL LOW (ref 26.0–34.0)
MCH: 23.7 pg — ABNORMAL LOW (ref 26.0–34.0)
MCH: 23.4 pg — ABNORMAL LOW (ref 26.0–34.0)
MCH: 23.8 pg — ABNORMAL LOW (ref 26.0–34.0)
MCHC: 31.1 g/dL (ref 30.0–36.0)
MCHC: 32.1 g/dL (ref 30.0–36.0)
MCHC: 32.4 g/dL (ref 30.0–36.0)
MCHC: 32.8 g/dL (ref 30.0–36.0)
MCV: 74.2 fL — ABNORMAL LOW (ref 80.0–100.0)
MCV: 74.1 fL — ABNORMAL LOW (ref 80.0–100.0)
MCV: 72.2 fL — ABNORMAL LOW (ref 80.0–100.0)
MCV: 72.6 fL — ABNORMAL LOW (ref 80.0–100.0)
Platelets: 640 K/uL — ABNORMAL HIGH (ref 150–400)
Platelets: 531 K/uL — ABNORMAL HIGH (ref 150–400)
Platelets: 570 K/uL — ABNORMAL HIGH (ref 150–400)
Platelets: 592 K/uL — ABNORMAL HIGH (ref 150–400)
RBC: 3.29 MIL/uL — ABNORMAL LOW (ref 3.87–5.11)
RBC: 3.16 MIL/uL — ABNORMAL LOW (ref 3.87–5.11)
RBC: 3.4 MIL/uL — ABNORMAL LOW (ref 3.87–5.11)
RBC: 3.42 MIL/uL — ABNORMAL LOW (ref 3.87–5.11)
RDW: 23.3 % — ABNORMAL HIGH (ref 11.5–15.5)
RDW: 23.1 % — ABNORMAL HIGH (ref 11.5–15.5)
RDW: 23.3 % — ABNORMAL HIGH (ref 11.5–15.5)
RDW: 23.4 % — ABNORMAL HIGH (ref 11.5–15.5)
WBC: 34.9 K/uL — ABNORMAL HIGH (ref 4.0–10.5)
WBC: 27.7 K/uL — ABNORMAL HIGH (ref 4.0–10.5)
WBC: 33.7 K/uL — ABNORMAL HIGH (ref 4.0–10.5)
WBC: 33.9 K/uL — ABNORMAL HIGH (ref 4.0–10.5)
nRBC: 0.4 % — ABNORMAL HIGH (ref 0.0–0.2)
nRBC: 0.4 % — ABNORMAL HIGH (ref 0.0–0.2)
nRBC: 0.5 % — ABNORMAL HIGH (ref 0.0–0.2)
nRBC: 0.5 % — ABNORMAL HIGH (ref 0.0–0.2)

## 2024-09-17 LAB — URINALYSIS, ROUTINE W REFLEX MICROSCOPIC
Bilirubin Urine: NEGATIVE
Glucose, UA: NEGATIVE mg/dL
Ketones, ur: NEGATIVE mg/dL
Nitrite: NEGATIVE
Protein, ur: 100 mg/dL — AB
Specific Gravity, Urine: 1.02 (ref 1.005–1.030)
WBC, UA: >50 WBC/hpf (ref 0–5)
pH: 5 (ref 5.0–8.0)

## 2024-09-17 LAB — COMPREHENSIVE METABOLIC PANEL WITH GFR
ALT: 134 U/L — ABNORMAL HIGH (ref 0–44)
ALT: 126 U/L — ABNORMAL HIGH (ref 0–44)
ALT: 127 U/L — ABNORMAL HIGH (ref 0–44)
AST: 153 U/L — ABNORMAL HIGH (ref 15–41)
AST: 128 U/L — ABNORMAL HIGH (ref 15–41)
AST: 115 U/L — ABNORMAL HIGH (ref 15–41)
Albumin: 2.2 g/dL — ABNORMAL LOW (ref 3.5–5.0)
Albumin: 2.1 g/dL — ABNORMAL LOW (ref 3.5–5.0)
Albumin: 2.3 g/dL — ABNORMAL LOW (ref 3.5–5.0)
Alkaline Phosphatase: 72 U/L (ref 38–126)
Alkaline Phosphatase: 64 U/L (ref 38–126)
Alkaline Phosphatase: 68 U/L (ref 38–126)
Anion gap: 12 (ref 5–15)
Anion gap: 11 (ref 5–15)
Anion gap: 13 (ref 5–15)
BUN: 66 mg/dL — ABNORMAL HIGH (ref 8–23)
BUN: 65 mg/dL — ABNORMAL HIGH (ref 8–23)
BUN: 62 mg/dL — ABNORMAL HIGH (ref 8–23)
CO2: 14 mmol/L — ABNORMAL LOW (ref 22–32)
CO2: 17 mmol/L — ABNORMAL LOW (ref 22–32)
CO2: 19 mmol/L — ABNORMAL LOW (ref 22–32)
Calcium: 6.8 mg/dL — ABNORMAL LOW (ref 8.9–10.3)
Calcium: 6.7 mg/dL — ABNORMAL LOW (ref 8.9–10.3)
Calcium: 6.7 mg/dL — ABNORMAL LOW (ref 8.9–10.3)
Chloride: 104 mmol/L (ref 98–111)
Chloride: 102 mmol/L (ref 98–111)
Chloride: 98 mmol/L (ref 98–111)
Creatinine, Ser: 2.3 mg/dL — ABNORMAL HIGH (ref 0.44–1.00)
Creatinine, Ser: 2.22 mg/dL — ABNORMAL HIGH (ref 0.44–1.00)
Creatinine, Ser: 2.07 mg/dL — ABNORMAL HIGH (ref 0.44–1.00)
GFR, Estimated: 22 mL/min — ABNORMAL LOW (ref >60)
GFR, Estimated: 23 mL/min — ABNORMAL LOW (ref >60)
GFR, Estimated: 25 mL/min — ABNORMAL LOW (ref >60)
Glucose, Bld: 141 mg/dL — ABNORMAL HIGH (ref 70–99)
Glucose, Bld: 159 mg/dL — ABNORMAL HIGH (ref 70–99)
Glucose, Bld: 125 mg/dL — ABNORMAL HIGH (ref 70–99)
Potassium: 4.7 mmol/L (ref 3.5–5.1)
Potassium: 4.4 mmol/L (ref 3.5–5.1)
Potassium: 3.9 mmol/L (ref 3.5–5.1)
Sodium: 130 mmol/L — ABNORMAL LOW (ref 135–145)
Sodium: 130 mmol/L — ABNORMAL LOW (ref 135–145)
Sodium: 130 mmol/L — ABNORMAL LOW (ref 135–145)
Total Bilirubin: 0.7 mg/dL (ref 0.0–1.2)
Total Bilirubin: 0.8 mg/dL (ref 0.0–1.2)
Total Bilirubin: 0.9 mg/dL (ref 0.0–1.2)
Total Protein: 4.6 g/dL — ABNORMAL LOW (ref 6.5–8.1)
Total Protein: 4.4 g/dL — ABNORMAL LOW (ref 6.5–8.1)
Total Protein: 4.7 g/dL — ABNORMAL LOW (ref 6.5–8.1)

## 2024-09-17 LAB — IRON AND TIBC
Iron: <10 ug/dL — ABNORMAL LOW (ref 28–170)
TIBC: 430 ug/dL (ref 250–450)

## 2024-09-17 LAB — DIFFERENTIAL
Abs Immature Granulocytes: 1 K/uL — ABNORMAL HIGH (ref 0.00–0.07)
Basophils Absolute: 0.7 K/uL — ABNORMAL HIGH (ref 0.0–0.1)
Basophils Relative: 2 %
Eosinophils Absolute: 0 K/uL (ref 0.0–0.5)
Eosinophils Relative: 0 %
Lymphocytes Relative: 1 %
Lymphs Abs: 0.3 K/uL — ABNORMAL LOW (ref 0.7–4.0)
Metamyelocytes Relative: 3 %
Monocytes Absolute: 0 K/uL — ABNORMAL LOW (ref 0.1–1.0)
Monocytes Relative: 0 %
Neutro Abs: 31.7 K/uL — ABNORMAL HIGH (ref 1.7–7.7)
Neutrophils Relative %: 94 %

## 2024-09-17 LAB — VITAMIN B12: Vitamin B-12: 889 pg/mL (ref 180–914)

## 2024-09-17 LAB — CORTISOL-AM, BLOOD: Cortisol - AM: 48.8 ug/dL — ABNORMAL HIGH (ref 6.7–22.6)

## 2024-09-17 LAB — RETIC PANEL
Immature Retic Fract: 9.8 % (ref 2.3–15.9)
RBC.: 3.38 MIL/uL — ABNORMAL LOW (ref 3.87–5.11)
Retic Count, Absolute: 47.3 K/uL (ref 19.0–186.0)
Retic Ct Pct: 1.4 % (ref 0.4–3.1)
Reticulocyte Hemoglobin: 18.6 pg — ABNORMAL LOW (ref >27.9)

## 2024-09-17 LAB — POC OCCULT BLOOD, ED
Fecal Occult Bld: NEGATIVE
Fecal Occult Bld: NEGATIVE

## 2024-09-17 LAB — LIPID PANEL
Cholesterol: 86 mg/dL (ref 0–200)
HDL: <10 mg/dL — ABNORMAL LOW (ref >40)
Triglycerides: 204 mg/dL — ABNORMAL HIGH (ref <150)
VLDL: 41 mg/dL — ABNORMAL HIGH (ref 0–40)

## 2024-09-17 LAB — T4, FREE: Free T4: 0.84 ng/dL (ref 0.61–1.12)

## 2024-09-17 LAB — FERRITIN: Ferritin: 20 ng/mL (ref 11–307)

## 2024-09-17 LAB — LACTATE DEHYDROGENASE: LDH: 291 U/L — ABNORMAL HIGH (ref 98–192)

## 2024-09-17 LAB — TSH: TSH: 1.742 u[IU]/mL (ref 0.350–4.500)

## 2024-09-17 LAB — HEMOGLOBIN A1C
Hgb A1c MFr Bld: 4.8 % (ref 4.8–5.6)
Mean Plasma Glucose: 91.06 mg/dL

## 2024-09-17 LAB — FOLATE: Folate: 12.3 ng/mL (ref >5.9)

## 2024-09-17 LAB — HEPATITIS PANEL, ACUTE
HCV Ab: NONREACTIVE
Hep A IgM: NONREACTIVE
Hep B C IgM: NONREACTIVE
Hepatitis B Surface Ag: NONREACTIVE

## 2024-09-17 MED ORDER — CYANOCOBALAMIN 1000 MCG/ML IJ SOLN: 1000.0000 ug | Freq: Once | INTRAMUSCULAR | Status: DC

## 2024-09-17 MED ORDER — METHOCARBAMOL 500 MG PO TABS
500.0000 mg | ORAL_TABLET | Freq: Three times a day (TID) | ORAL | Status: DC | PRN
  Administered 2024-09-17: 500 mg via ORAL
  Filled 2024-09-17: qty 1

## 2024-09-17 MED ORDER — IBUPROFEN 200 MG PO TABS
200.0000 mg | ORAL_TABLET | Freq: Once | ORAL | Status: AC
  Administered 2024-09-17: 200 mg via ORAL
  Filled 2024-09-17: qty 1

## 2024-09-17 NOTE — ED Notes (Signed)
 Pt taken to MRI

## 2024-09-17 NOTE — Plan of Care (Signed)
 ED charge nurse reported that per lab protocol we cannot get blood cultures while patient is getting blood transfusion.  Patient will receive total 3 units of blood transfusion so we have to wait until blood transfusion will be completed.

## 2024-09-17 NOTE — ED Notes (Signed)
 CCMD called to report transfer to RM [id number].

## 2024-09-17 NOTE — Plan of Care (Signed)
   Problem: Clinical Measurements: Goal: Diagnostic test results will improve Outcome: Progressing Goal: Respiratory complications will improve Outcome: Progressing

## 2024-09-17 NOTE — Progress Notes (Incomplete)
 STROKE TEAM PROGRESS NOTE    INTERIM HISTORY/SUBJECTIVE  Patient remains hemodynamically stable and afebrile with 1 episode of hypotension yesterday evening.  Hemoglobin has risen to 7.5 after transfusions.  OBJECTIVE  CBC    Component Value Date/Time   WBC 27.7 (H) 09/17/2024 0800   RBC 3.16 (L) 09/17/2024 0800   HGB 7.5 (L) 09/17/2024 0800   HGB 4.7 (LL) 09/13/2024 1554   HCT 23.4 (L) 09/17/2024 0800   HCT 17.3 (LL) 09/13/2024 1554   PLT 531 (H) 09/17/2024 0800   PLT 973 (HH) 09/13/2024 1554   MCV 74.1 (L) 09/17/2024 0800   MCV 72 (L) 09/13/2024 1554   MCH 23.7 (L) 09/17/2024 0800   MCHC 32.1 09/17/2024 0800   RDW 23.1 (H) 09/17/2024 0800   RDW 23.7 (H) 09/13/2024 1554   LYMPHSABS 1.0 09/07/2024 0042   LYMPHSABS 1.2 06/23/2023 1205   MONOABS 0.4 09/07/2024 0042   EOSABS 0.8 (H) 09/07/2024 0042   EOSABS 0.6 (H) 06/23/2023 1205   BASOSABS 0.2 (H) 09/07/2024 0042   BASOSABS 0.4 (H) 06/23/2023 1205    BMET    Component Value Date/Time   NA 130 (L) 09/17/2024 0800   NA 138 01/12/2024 1422   K 4.4 09/17/2024 0800   CL 102 09/17/2024 0800   CO2 17 (L) 09/17/2024 0800   GLUCOSE 159 (H) 09/17/2024 0800   BUN 65 (H) 09/17/2024 0800   BUN 28 (H) 01/12/2024 1422   CREATININE 2.22 (H) 09/17/2024 0800   CREATININE 1.44 (H) 08/03/2024 1024   CREATININE 1.33 (H) 03/03/2016 1531   CALCIUM  6.7 (L) 09/17/2024 0800   EGFR 31 (L) 01/12/2024 1422   GFRNONAA 23 (L) 09/17/2024 0800   GFRNONAA 39 (L) 08/03/2024 1024    IMAGING past 24 hours US  RENAL Result Date: 09/17/2024 CLINICAL DATA:  [id number] AKI (acute kidney injury) [id number] EXAM: RENAL / URINARY TRACT ULTRASOUND COMPLETE COMPARISON:  September 16, 2024 FINDINGS: Right Kidney: Renal measurements: 8.7 x 4.6 x 4.9 cm = volume: 101 mL. Echogenicity is mildly increased. No mass or hydronephrosis visualized. Left Kidney: Renal measurements: 10.3 x 3.6 x 4.8 cm = volume: 93 mL. Echogenicity is mildly increased. No mass or  hydronephrosis visualized. Bladder: Decompressed around a Foley catheter. Other: Cholelithiasis.  Splenomegaly.  Trace ascites. IMPRESSION: 1. No hydronephrosis. 2. Mildly increased renal echogenicity as can be seen in medical renal disease. 3. Splenomegaly. 4. Cholelithiasis. Electronically Signed   By: Corean Salter M.D.   On: 09/17/2024 12:57   US  Abdomen Limited RUQ (LIVER/GB) Result Date: 09/16/2024 CLINICAL DATA:  Transaminitis. EXAM: ULTRASOUND ABDOMEN LIMITED RIGHT UPPER QUADRANT COMPARISON:  None Available. FINDINGS: Gallbladder: Multiple shadowing, echogenic, mobile gallstones are seen within the gallbladder lumen. The largest measures approximately 1.3 cm. Gallbladder wall thickening is seen (7.0 mm). Pericholecystic fluid is also present. No sonographic Murphy sign noted by sonographer. Common bile duct: Diameter: 5.1 mm Liver: No focal lesion identified. Within normal limits in parenchymal echogenicity. Portal vein is patent on color Doppler imaging with normal direction of blood flow towards the liver. Other: Of incidental note is the presence of a right sided pleural effusion. IMPRESSION: 1. Cholelithiasis with additional findings consistent with sequelae associated with acute cholecystitis. 2. Right pleural effusion. Electronically Signed   By: Suzen Dials M.D.   On: 09/16/2024 23:35   CT ANGIO HEAD NECK W WO CM (CODE STROKE) Result Date: 09/16/2024 EXAM: CTA HEAD AND NECK WITH AND WITHOUT 09/16/2024 06:24:15 PM TECHNIQUE: CTA of the head and neck was performed  with and without the administration of 50 mL of intravenous contrast (iohexol  (OMNIPAQUE ) 350 MG/ML injection 75 mL IOHEXOL  350 MG/ML SOLN). Multiplanar 2D and/or 3D reformatted images are provided for review. Automated exposure control, iterative reconstruction, and/or weight based adjustment of the mA/kV was utilized to reduce the radiation dose to as low as reasonably achievable. Stenosis of the internal carotid arteries  measured using NASCET criteria. COMPARISON: CT angio head and neck 02/09/2024 at [facility] and CTA head and neck 03/04/2021 at [facility]. CLINICAL HISTORY: Neuro deficit, acute, stroke suspected. FINDINGS: CTA NECK: AORTIC ARCH AND ARCH VESSELS: Extensive atherosclerotic changes are again noted at the aortic arch and great vessel origins. Severe stenosis of the left subclavian artery proximal to the vertebral artery has again noted. No dissection or arterial injury. No significant stenosis of the brachiocephalic artery. CERVICAL CAROTID ARTERIES: Atherosclerotic calcifications are present throughout the right common carotid artery without a significant stenosis relative to the more distal vessel. Atherosclerotic calcifications are present in the proximal right ICA without significant stenosis. Diffuse mural calcification are present throughout the left common carotid artery without significant stenosis. Patient motion is present through the area of stenosis previously noted in the proximal left ICA. The stenosis is not significantly changed. No dissection or arterial injury. CERVICAL VERTEBRAL ARTERIES: The left vertebral artery is occluded in the V1 and proximal left V2 segments. It is partially reconstituted in the neck. The right vertebral artery shows no dissection, arterial injury, or significant stenosis in its cervical segments. LUNGS AND MEDIASTINUM: Unremarkable. SOFT TISSUES: No acute abnormality. BONES: Multilevel degenerative changes are again noted in the cervical spine. CTA HEAD: ANTERIOR CIRCULATION: Atherosclerotic calcifications are present within the cavernous internal carotid arteries bilaterally without significant stenoses through the ICA terminae. New left A1 segment is hypoplastic. No significant stenosis of the middle cerebral arteries. No aneurysm. POSTERIOR CIRCULATION: A fetal-type right cerebral artery is again noted. Moderate narrowing is present in the left P1 segment.  Stable mild narrowing in the mid basilar artery is stable. Moderate narrowing is present in the distal vertebral arteries bilaterally. No aneurysm. OTHER: ECA branch vessels are opacified bilaterally. No dural venous sinus thrombosis on this non-dedicated study. IMPRESSION: 1. No large vessel occlusion or aneurysm in the head or neck. 2. 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 3. 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 4. Moderate narrowing in the left P1 segment, distal vertebral arteries bilaterally, and stable mild narrowing in the mid basilar artery. 5. Extensive atherosclerotic changes as described. Findings were communicated to doctor Michaela via the amion system at 06:48 pm Electronically signed by: Lonni Necessary MD 09/16/2024 07:00 PM EDT RP Workstation: [id number]   CT HEAD CODE STROKE WO CONTRAST Result Date: 09/16/2024 EXAM: CT HEAD WITHOUT CONTRAST 09/16/2024 06:12:59 PM TECHNIQUE: CT of the head was performed without the administration of intravenous contrast. Automated exposure control, iterative reconstruction, and/or weight based adjustment of the mA/kV was utilized to reduce the radiation dose to as low as reasonably achievable. The study is mildly degraded by patient motion. COMPARISON: CT head without contrast 01/11/2024. MR head without contrast 03/09/2024 at [facility]. CLINICAL HISTORY: Neuro deficit, acute, stroke suspected. FINDINGS: BRAIN AND VENTRICLES: No acute hemorrhage. No evidence of acute infarct. No hydrocephalus. No extra-axial collection. No mass effect or midline shift. Remote lacunar infarcts are again noted within the right caudate head and internal capsule. Confluent Periventricular  and scattered subcortical white matter hypoattenuation is moderately advanced for age. This most likely reflects the sequelae of chronic microvascular ischemia.  Atherosclerotic calcifications are present in the cavernous carotid arteries bilaterally and at the dural margin of both vertebral arteries. No hyperdense vessel is present. ORBITS: No acute abnormality. SINUSES: No acute abnormality. SOFT TISSUES AND SKULL: No acute soft tissue abnormality. No skull fracture. [place] stroke program early CT (aspect) score: Ganglionic (caudate, ic, Lentiform Nucleus, insula, M1-m3): 7 Supraganglionic (m4-m6): 3 Total: 10 IMPRESSION: 1. No acute intracranial abnormality.  ASPECTS 10/10 2. Remote lacunar infarcts in the right caudate head and internal capsule. 3. Moderately advanced confluent periventricular and scattered subcortical white matter hypoattenuation, likely sequelae of chronic microvascular ischemia. 4. Atherosclerotic calcifications in the cavernous carotid arteries bilaterally and at the dural margin of both vertebral arteries. No hyperdense vessel is present. The pertinent results were texted to Dr. Michaela via the Amion system at 6:21 pm. Electronically signed by: Lonni Necessary MD 09/16/2024 06:23 PM EDT RP Workstation: [id number]    Vitals:   09/17/24 0415 09/17/24 0700 09/17/24 0720 09/17/24 1049  BP: (!) 118/58  (!) 120/55 103/66  Pulse: 85 85 81 77  Resp: 17 12 16 19   Temp:   98.2 F (36.8 C) 98 F (36.7 C)  TempSrc:      SpO2: 99% 99% 95% 95%  Weight:      Height:         PHYSICAL EXAM General: Chronically ill-appearing elderly patient in no acute distress, right AKA Psych:  Mood and affect appropriate for situation CV: Regular rate and rhythm on monitor Respiratory:  Regular, unlabored respirations on room air   NEURO:  Mental Status: AA&Ox3, patient is able to give some history Speech/Language: speech is without dysarthria or aphasia.  Naming, repetition, fluency, and comprehension intact.  Cranial Nerves:  II: PERRL. Visual fields full.  III, IV, VI: EOMI. Eyelids elevate symmetrically.  V: Sensation is intact to light  touch and symmetrical to face.  VII: Subtle right facial droop VIII: hearing intact to voice. IX, X: Phonation is normal.  KP:Dynloizm shrug stronger on the left XII: tongue is midline without fasciculations. Motor: 5/5 strength to left upper and lower extremities, 1/5 strength to right upper extremity, 3/5 strength to right lower extremity Tone: is normal and bulk is normal Sensation- Intact to light touch bilaterally.  Coordination: FTN intact on the left, unable to perform on the right Gait- deferred  Most Recent NIH  1a Level of Conscious.: 0 1b LOC Questions: 0 1c LOC Commands: 0 2 Best Gaze: 0 3 Visual: 0 4 Facial Palsy: 1 5a Motor Arm - left: 0 5b Motor Arm - Right: 3 6a Motor Leg - Left: 0 6b Motor Leg - Right: 0 7 Limb Ataxia: 0 8 Sensory: 0 9 Best Language: 0 10 Dysarthria: 0 11 Extinct. and Inatten.: 0 TOTAL: 4   ASSESSMENT/PLAN  [name] is a 73 y.o. female with history of A-fib on anticoagulation, stroke with residual right-sided hemiparesis and polycythemia vera who presented with an episode of aphasia and worsening right arm weakness.  MRI brain is pending.  She was recently seen by her urologist for hematuria and was found to have a hemoglobin of 4.6 on admission.  NIH on Admission 10  Stroke: two punctate infarct at R thalamus and left occipital, etiology likely due to advanced vasculopathy in the setting of severe anemia Code Stroke CT head No acute abnormality. Small vessel disease. ASPECTS 10.  CTA head & neck no LVO, severe left subclavian artery stenosis, left vertebral artery occlusion in V1 and V2 segments, moderate narrowing in left P1 segment, moderate narrowing in bilateral distal vertebral arteries and mild narrowing in mid basilar artery MRI Acute punctate diffusion abnormality in the anterior right thalamus and focal restricted diffusion in the left occipital lobe, consistent with focal acute infarcts. Remote lacunar infarct in the  anterior right lentiform nucleus and left paramedian pons 2D Echo pending Carotid doppler pending Direct LDL pending, HDL < 10 HgbA1c 4.8 VTE prophylaxis - SCDs aspirin  81 mg daily and Eliquis  (apixaban ) daily prior to admission, now on No antithrombotic secondary to hematuria and severe anemia Therapy recommendations:  Pending Disposition: Pending  Severe anemia Leukocytosis and thrombocytosis Hb 4.7--4.6--s/p PRBC -- 7.5   Atrial fibrillation Home Meds: Flecainide  50 mg twice daily, Eliquis  2.5 mg twice daily Continue telemetry monitoring Anticoagulation held in the setting of severe anemia with hematuria  Hypertension Home meds: Lisinopril  10 mg daily Stable Blood Pressure Goal: BP less than 220/110   Hyperlipidemia Home meds: Ezetimibe  10 mg daily Direct LDL pending High intensity statin not indicated due to previous intolerance, will consider PCSK9 inhibitor or Leqvio if LDL greater than goal Continue statin at discharge  Dysphagia Patient has post-stroke dysphagia, SLP consulted    Diet   Diet NPO time specified   Advance diet as tolerated  Anemia and hematuria Patient was recently seen by outpatient urologist for persistent hematuria Urology consulted, appreciate recommendations Patient was on Eliquis  2.5 mg twice daily at home 3 units PRBCs transfused Monitoring of hemoglobin and further transfusions per primary team  Other Stroke Risk Factors None   Other Active Problems AKI on CKD Transaminitis with AST 265 and ALT 164  Hospital day # 1  Patient seen by NP and then by MD, MD to edit note as needed. Cortney E Everitt Clint Kill , MSN, AGACNP-BC [facility] See Amion for schedule and pager information 09/17/2024 2:05 PM    To contact Stroke Continuity provider, please refer to [email or web address]. After hours, contact General Neurology

## 2024-09-17 NOTE — ED Notes (Signed)
 MRI called to inquire when patient would be getting MRI. MRI states that she is on the schedule but will most likely be seen during day shift.

## 2024-09-17 NOTE — ED Notes (Addendum)
 Hema occult completed with NEG results, done in mini lab @ 1030

## 2024-09-17 NOTE — ED Notes (Signed)
 Patient is transfusing 1 unit of PRBCs and needs to have IV antibiotics. Per Dr. Micaela patient is able to have the antibiotics without the blood cultures being drawn first due to the policy of no labs drawn while blood transfusing.

## 2024-09-17 NOTE — Progress Notes (Signed)
 [name]   DOB:04-Dec-1951   FM#:[id number]   RDW#:[id number]  Hematology follow-up note  Subjective: Patient is well-known to me, under my care for MPN.  She was last seen by me in the office in August 2025.  She has had a recurrent hospital admission this month, was admitted again yesterday for severe anemia and altered mental status.  Patient does not remember what happened yesterday, but does report hematuria for the past few weeks.  She has been seen by urology.  She denies any other source of bleeding.  She reports extreme fatigue, no fever, night sweats, weight loss or other new symptoms.   Objective:  Vitals:   09/17/24 1049 09/17/24 1345  BP: 103/66 129/64  Pulse: 77 78  Resp: 19 18  Temp: 98 F (36.7 C)   SpO2: 95% 97%    Body mass index is 15.78 kg/m.  Intake/Output Summary (Last 24 hours) at 09/17/2024 1459 Last data filed at 09/17/2024 0530 Gross per 24 hour  Intake 2994.25 ml  Output --  Net 2994.25 ml     Sclerae unicteric  Oropharynx clear  No peripheral adenopathy  Lungs clear -- no rales or rhonchi  Heart regular rate and rhythm  Abdomen benign  MSK no focal spinal tenderness, no peripheral edema  Neuro nonfocal    CBG (last 3)  Recent Labs    09/16/24 1650 09/16/24 1751 09/16/24 1834  GLUCAP 79 76 89     Labs:  Lab Results  Component Value Date   WBC 27.7 (H) 09/17/2024   HGB 7.5 (L) 09/17/2024   HCT 23.4 (L) 09/17/2024   MCV 74.1 (L) 09/17/2024   PLT 531 (H) 09/17/2024   NEUTROABS 18.4 (H) 09/07/2024    Urine Studies No results for input(s): UHGB, CRYS in the last 72 hours.  Invalid input(s): UACOL, UAPR, USPG, UPH, UTP, UGL, UKET, UBIL, UNIT, UROB, ULEU, UEPI, UWBC, [place], [place], [place], [place], [place]  Basic Metabolic Panel: Recent Labs  Lab 09/16/24 1639 09/16/24 1800 09/17/24 0449 09/17/24 0800  NA 126*  --  130* 130*  K 5.8*  --  4.7 4.4  CL 100  --  104 102  CO2 11*  --  14* 17*   GLUCOSE 90  --  141* 159*  BUN 76*  --  66* 65*  CREATININE 2.65*  --  2.30* 2.22*  CALCIUM  7.5*  --  6.8* 6.7*  MG  --  1.5*  --   --    GFR Estimated Creatinine Clearance: 14.9 mL/min (A) (by C-G formula based on SCr of 2.22 mg/dL (H)). Liver Function Tests: Recent Labs  Lab 09/16/24 1639 09/17/24 0449 09/17/24 0800  AST 265* 153* 128*  ALT 164* 134* 126*  ALKPHOS 82 72 64  BILITOT 0.8 0.7 0.8  PROT 5.1* 4.6* 4.4*  ALBUMIN  2.5* 2.2* 2.1*   No results for input(s): LIPASE, AMYLASE in the last 168 hours. No results for input(s): AMMONIA in the last 168 hours. Coagulation profile Recent Labs  Lab 09/16/24 1800  INR 2.2*    CBC: Recent Labs  Lab 09/13/24 1554 09/16/24 1639 09/17/24 0449 09/17/24 0800  WBC 53.7* 54.1* 34.9* 27.7*  HGB 4.7* 4.6* 7.6* 7.5*  HCT 17.3* 16.5* 24.4* 23.4*  MCV 72* 69.6* 74.2* 74.1*  PLT 973* 902* 640* 531*   Cardiac Enzymes: No results for input(s): CKTOTAL, CKMB, CKMBINDEX, TROPONINI in the last 168 hours. BNP: Invalid input(s): POCBNP CBG: Recent Labs  Lab 09/16/24 1650 09/16/24 1751 09/16/24 1834  GLUCAP 79 76 89   D-Dimer No results for input(s): DDIMER in the last 72 hours. Hgb A1c Recent Labs    09/17/24 0449  HGBA1C 4.8   Lipid Profile Recent Labs    09/17/24 0449  CHOL 86  HDL <10*  LDLCALC NOT CALCULATED  TRIG 204*  CHOLHDL NOT CALCULATED   Thyroid  function studies Recent Labs    09/17/24 0449  TSH 1.742   Anemia work up No results for input(s): VITAMINB12, FOLATE, FERRITIN, TIBC, IRON, RETICCTPCT in the last 72 hours. Microbiology Recent Results (from the past 240 hours)  Culture, blood (Routine X 2) w Reflex to ID Panel     Status: None (Preliminary result)   Collection Time: 09/17/24  4:58 AM   Specimen: BLOOD LEFT ARM  Result Value Ref Range Status   Specimen Description BLOOD LEFT ARM  Final   Special Requests   Final    BOTTLES DRAWN AEROBIC AND ANAEROBIC  Blood Culture adequate volume   Culture   Final    NO GROWTH < 12 HOURS Performed at [facility], [address], [place], [place] [address]    Report Status PENDING  Incomplete  Culture, blood (Routine X 2) w Reflex to ID Panel     Status: None (Preliminary result)   Collection Time: 09/17/24  5:02 AM   Specimen: BLOOD LEFT HAND  Result Value Ref Range Status   Specimen Description BLOOD LEFT HAND  Final   Special Requests   Final    BOTTLES DRAWN AEROBIC ONLY Blood Culture adequate volume   Culture   Final    NO GROWTH < 12 HOURS Performed at [facility], [address], [place], [place] [address]    Report Status PENDING  Incomplete      Studies:  US  RENAL Result Date: 09/17/2024 CLINICAL DATA:  [id number] AKI (acute kidney injury) [id number] EXAM: RENAL / URINARY TRACT ULTRASOUND COMPLETE COMPARISON:  September 16, 2024 FINDINGS: Right Kidney: Renal measurements: 8.7 x 4.6 x 4.9 cm = volume: 101 mL. Echogenicity is mildly increased. No mass or hydronephrosis visualized. Left Kidney: Renal measurements: 10.3 x 3.6 x 4.8 cm = volume: 93 mL. Echogenicity is mildly increased. No mass or hydronephrosis visualized. Bladder: Decompressed around a Foley catheter. Other: Cholelithiasis.  Splenomegaly.  Trace ascites. IMPRESSION: 1. No hydronephrosis. 2. Mildly increased renal echogenicity as can be seen in medical renal disease. 3. Splenomegaly. 4. Cholelithiasis. Electronically Signed   By: Corean Salter M.D.   On: 09/17/2024 12:57   US  Abdomen Limited RUQ (LIVER/GB) Result Date: 09/16/2024 CLINICAL DATA:  Transaminitis. EXAM: ULTRASOUND ABDOMEN LIMITED RIGHT UPPER QUADRANT COMPARISON:  None Available. FINDINGS: Gallbladder: Multiple shadowing, echogenic, mobile gallstones are seen within the gallbladder lumen. The largest measures approximately 1.3 cm. Gallbladder wall thickening is seen (7.0 mm). Pericholecystic fluid is also present. No sonographic Murphy sign noted by  sonographer. Common bile duct: Diameter: 5.1 mm Liver: No focal lesion identified. Within normal limits in parenchymal echogenicity. Portal vein is patent on color Doppler imaging with normal direction of blood flow towards the liver. Other: Of incidental note is the presence of a right sided pleural effusion. IMPRESSION: 1. Cholelithiasis with additional findings consistent with sequelae associated with acute cholecystitis. 2. Right pleural effusion. Electronically Signed   By: Suzen Dials M.D.   On: 09/16/2024 23:35   CT ANGIO HEAD NECK W WO CM (CODE STROKE) Result Date: 09/16/2024 EXAM: CTA HEAD AND NECK WITH AND WITHOUT 09/16/2024 06:24:15 PM TECHNIQUE:  CTA of the head and neck was performed with and without the administration of 50 mL of intravenous contrast (iohexol  (OMNIPAQUE ) 350 MG/ML injection 75 mL IOHEXOL  350 MG/ML SOLN). Multiplanar 2D and/or 3D reformatted images are provided for review. Automated exposure control, iterative reconstruction, and/or weight based adjustment of the mA/kV was utilized to reduce the radiation dose to as low as reasonably achievable. Stenosis of the internal carotid arteries measured using NASCET criteria. COMPARISON: CT angio head and neck 02/09/2024 at [facility] and CTA head and neck 03/04/2021 at [facility]. CLINICAL HISTORY: Neuro deficit, acute, stroke suspected. FINDINGS: CTA NECK: AORTIC ARCH AND ARCH VESSELS: Extensive atherosclerotic changes are again noted at the aortic arch and great vessel origins. Severe stenosis of the left subclavian artery proximal to the vertebral artery has again noted. No dissection or arterial injury. No significant stenosis of the brachiocephalic artery. CERVICAL CAROTID ARTERIES: Atherosclerotic calcifications are present throughout the right common carotid artery without a significant stenosis relative to the more distal vessel. Atherosclerotic calcifications are present in the proximal right ICA without  significant stenosis. Diffuse mural calcification are present throughout the left common carotid artery without significant stenosis. Patient motion is present through the area of stenosis previously noted in the proximal left ICA. The stenosis is not significantly changed. No dissection or arterial injury. CERVICAL VERTEBRAL ARTERIES: The left vertebral artery is occluded in the V1 and proximal left V2 segments. It is partially reconstituted in the neck. The right vertebral artery shows no dissection, arterial injury, or significant stenosis in its cervical segments. LUNGS AND MEDIASTINUM: Unremarkable. SOFT TISSUES: No acute abnormality. BONES: Multilevel degenerative changes are again noted in the cervical spine. CTA HEAD: ANTERIOR CIRCULATION: Atherosclerotic calcifications are present within the cavernous internal carotid arteries bilaterally without significant stenoses through the ICA terminae. New left A1 segment is hypoplastic. No significant stenosis of the middle cerebral arteries. No aneurysm. POSTERIOR CIRCULATION: A fetal-type right cerebral artery is again noted. Moderate narrowing is present in the left P1 segment. Stable mild narrowing in the mid basilar artery is stable. Moderate narrowing is present in the distal vertebral arteries bilaterally. No aneurysm. OTHER: ECA branch vessels are opacified bilaterally. No dural venous sinus thrombosis on this non-dedicated study. IMPRESSION: 1. No large vessel occlusion or aneurysm in the head or neck. 2. 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 3. 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 4. Moderate narrowing in the left P1 segment, distal vertebral arteries bilaterally, and stable mild narrowing in the mid basilar artery. 5. Extensive atherosclerotic changes as described. Findings were communicated to doctor Michaela via the amion  system at 06:48 pm Electronically signed by: Lonni Necessary MD 09/16/2024 07:00 PM EDT RP Workstation: [id number]   CT HEAD CODE STROKE WO CONTRAST Result Date: 09/16/2024 EXAM: CT HEAD WITHOUT CONTRAST 09/16/2024 06:12:59 PM TECHNIQUE: CT of the head was performed without the administration of intravenous contrast. Automated exposure control, iterative reconstruction, and/or weight based adjustment of the mA/kV was utilized to reduce the radiation dose to as low as reasonably achievable. The study is mildly degraded by patient motion. COMPARISON: CT head without contrast 01/11/2024. MR head without contrast 03/09/2024 at [facility]. CLINICAL HISTORY: Neuro deficit, acute, stroke suspected. FINDINGS: BRAIN AND VENTRICLES: No acute hemorrhage. No evidence of acute infarct. No hydrocephalus. No extra-axial collection. No mass effect or midline shift. Remote lacunar infarcts are again noted within the  right caudate head and internal capsule. Confluent Periventricular and scattered subcortical white matter hypoattenuation is moderately advanced for age. This most likely reflects the sequelae of chronic microvascular ischemia. Atherosclerotic calcifications are present in the cavernous carotid arteries bilaterally and at the dural margin of both vertebral arteries. No hyperdense vessel is present. ORBITS: No acute abnormality. SINUSES: No acute abnormality. SOFT TISSUES AND SKULL: No acute soft tissue abnormality. No skull fracture. [place] stroke program early CT (aspect) score: Ganglionic (caudate, ic, Lentiform Nucleus, insula, M1-m3): 7 Supraganglionic (m4-m6): 3 Total: 10 IMPRESSION: 1. No acute intracranial abnormality.  ASPECTS 10/10 2. Remote lacunar infarcts in the right caudate head and internal capsule. 3. Moderately advanced confluent periventricular and scattered subcortical white matter hypoattenuation, likely sequelae of chronic microvascular ischemia. 4. Atherosclerotic calcifications in the  cavernous carotid arteries bilaterally and at the dural margin of both vertebral arteries. No hyperdense vessel is present. The pertinent results were texted to Dr. Michaela via the Amion system at 6:21 pm. Electronically signed by: Lonni Necessary MD 09/16/2024 06:23 PM EDT RP Workstation: [id number]    Assessment: 73 y.o. female   Severe anemia, secondary to hematuria, ? Other source of bleeding and IDA Hematuria, rule out a bladder mass Acute encephalopathy, history of CVA Leukocytosis and thrombocytosis, secondary to MPN, improved since admission Hypotension, resolved Transaminitis Acute on chronic kidney disease Peripheral vascular disease, status post right BKA AF on eliquis  which is on-hold now     Plan:  - Patient has received blood transfusion, hemoglobin 7.5 this morning - Her leukocytosis and thrombocytosis has improved since admission, close to baseline now.   - I will check iron study, B12, folate, reticulocyte count, and hemolysis lab - Giving acute worsening of anemia in the past month, I do not suspect her anemia is related to worsening of MPN or transforming to myelofibrosis.  I do not think she needs bone marrow biopsy for now. - She is not a candidate for Hydrea  due to the anemia. - I will follow her after discharge to monitor her anemia.   Onita Mattock, MD 09/17/2024  2:59 PM

## 2024-09-17 NOTE — Progress Notes (Signed)
 PROGRESS NOTE  [name]  FMW:[id number] DOB: [birth year] DOA: 09/16/2024 PCP: Cathlene Marry Lenis, FNP   Brief Narrative: Patient is a 73 year old female with history of chronic leukocytosis, thrombocytosis, PCV, paroxysmal A-fib on anticoagulation, CVA with right-sided hemiparesis, dysphagia, peripheral artery disease with multiple stent placement, gangrene of the left hand digits, right lower extremity AKA, coronary disease, carotid artery disease, HFpEF, mitral regurgitation, chronic hypoxic failure on 2 L of oxygen at baseline, CKD stage IIIb, hypertension, GERD, hyperlipidemia who presented with complaint of dizziness, poor appetite, fatigue, hematuria for a week.  She was recently seen by urologist 3 to 4 days ago for hematuria and suspected to have a bladder mass.  On presentation, she was alert and oriented.  While at the emergency department, she developed trouble speaking, left-sided upper extremity weakness, code stroke activated.  CT head, CTA head and neck unremarkable.  Neurology was consulted.  She was hypertensive on presentation.  Lab work showed hemoglobin of 4.6, leukocytes of 54K(baseline of ), thrombocytosis, sodium of 126, potassium of 5.8, creatinine of 2.6, elevated liver enzymes, bicarb of 11.  Patient was given Solu-Medrol , calcium  gluconate, IV fluid, 3 units of blood transfusion in the ED.  Urology consulted, underwent Foley catheter placement.  Admitted for further management.  Assessment & Plan:  Principal Problem:   Symptomatic anemia Active Problems:   Leukocytosis   Hematuria   Bladder mass   Thrombocytosis   Hyponatremia   Transaminitis   Acute kidney injury superimposed on chronic kidney disease   Peripheral artery disease   Paroxysmal atrial fibrillation (HCC)   Essential hypertension   Generalized anxiety disorder   CKD stage 3b, GFR 30-44 ml/min (HCC)   History of CVA with residual deficit   Hyperkalemia   Metabolic acidosis   Weakness of left  upper extremity-resolved   Stenosis of left subclavian artery s/p stent   History of CAD (coronary artery disease)   Chronic diastolic CHF (congestive heart failure) (HCC)   Adrenal crisis   Mood disorder   Chronic hypoxic respiratory failure (HCC)   Reactive airway disease   Prolonged Q-T interval on ECG   Acute cholecystitis with cholelithiasis   Acute blood loss anemia secondary to hematuria: Presented with hemoglobin in the range of 4.  Report of hematuria for the last 3 weeks associated with weakness, dizziness, poor appetite. Given 3 units of blood transfusion here, hemoglobin in the range of 7 today. Hemoglobin ranged from 8-9.  Continue to monitor H&H. Urology not suspecting severe anemia is from hematuria.  FOBT is negative   Suspected bladder mass/hematuria: Patient is seen by urology and was suspected to have bladder mass.  Urology consulted here.  Underwent Foley catheter placement.   Urology recommending outpatient follow-up for workup of suspected bladder mass.  Follow-up renal ultrasound/bladder ultrasound today  Altered mental status/left upper extremity weakness, dysarthria: The symptoms have resolved.  Code stroke was activated in the emergency department.  Neurology consulted.  CT head, CTA head and neck was not suspicious.,  Did not show any acute findings.  She has history of CVA.  MRI pending.  PT/OT consulted  AKI on CKD stage 3b/NAGMA: Baseline creatinine of around 1.4-1.5 presented with creatinine in the range of 2.6.  Improving.  Will obtain ultrasound of the kidneys.  Continue bicarb drip  Elevated liver enzymes: No evidence of Murphy sign.  Right upper quadrant nontender.  Right upper quadrant ultrasound showed cholelithiasis with suspicion for acute cholecystitis.  General surgery recommended  HIDA.  Currently  on ceftriaxone , Flagyl.  Hepatitis panel negative.  No abdomen pain, nausea or vomiting.  Follow-up HIDA report  Leukocytosis/thrombocytosis: Has chronic  leukocytosis, thrombocytosis.  Baseline leukocytes of 21 -24.  Follows with hematology.  Leukocytosis close to baseline now  Suspected UTI: UA was suspicious for UTI.  Urine culture sent.  Follow-up.  Continue ceftriaxone  for now.  Hypotension: Hypotensive on presentation.  Blood pressure improved after given IV fluids.  Continue to monitor.  Also given a dose of Solu-Medrol  for suspected adrenal crisis.  Random cortisol obtained this morning is above normal.  Continue to taper steroid and stop.  TSH, free T4 is normal  History of paroxysmal A-fib/prolonged QTc: Currently in normal sinus rhythm.  QTc of 522.  Continue to monitor.  Cardizem  on hold due to hypotension.  Eliquis  on hold due to hematuria and low hemoglobin.  Will resume as appropriate  History of coronary artery disease/peripheral artery disease: Takes Eliquis , e at home.  No anginal symptoms.  Chronic hypoxic respiratory failure/reactive airway disease: On oxygen at home at 2 L.  Currently on the same.  General anxiety disorder: On Lexapro .  Has flat affect  Poor appetite/severe protein calorie malnutrition: BMI of 15.8.  Dietitian consulted.  Currently on dysphagia 1 diet.  Continue protein supplements          DVT prophylaxis:SCDs Start: 09/16/24 1943     Code Status: Full Code  Family Communication: Called daughter Rodgers on phone on 9/28 for update,call not received  Patient status:Inpatient  Patient is from :Home  Anticipated discharge to:not sure  Estimated DC date:not sure   Consultants: Urology  Procedures: None  Antimicrobials:  Anti-infectives (From admission, onward)    Start     Dose/Rate Route Frequency Ordered Stop   09/17/24 0000  cefTRIAXone  (ROCEPHIN ) 2 g in sodium chloride  0.9 % 100 mL IVPB        2 g 200 mL/hr over 30 Minutes Intravenous Every 24 hours 09/16/24 2355     09/17/24 0000  metroNIDAZOLE (FLAGYL) IVPB 500 mg        500 mg 100 mL/hr over 60 Minutes Intravenous Every 12 hours  09/16/24 2355 09/23/24 2159       Subjective: Patient seen and examined at bedside today.  During my evaluation, she was very comfortable.  Lying in bed.  Mostly oriented.  Blood pressure has improved and currently stable with systolic in the range of 120s.  On normal sinus rhythm.  Does not look in any kind of distress.  Denies any abdomen pain, nausea or vomiting.  Foley has been draining clear urine.  She denies any hematochezia or melena.  Objective: Vitals:   09/17/24 0400 09/17/24 0415 09/17/24 0700 09/17/24 0720  BP: (!) 123/56 (!) 118/58  (!) 120/55  Pulse: 86 85 85 81  Resp: 17 17 12 16   Temp:    98.2 F (36.8 C)  TempSrc:      SpO2: 99% 99% 99% 95%  Weight:      Height:        Intake/Output Summary (Last 24 hours) at 09/17/2024 0818 Last data filed at 09/17/2024 0530 Gross per 24 hour  Intake 2994.25 ml  Output --  Net 2994.25 ml   Filed Weights   09/16/24 1910  Weight: 41.7 kg    Examination:  General exam: Very deconditioned, chronically ill looking, weak, cachectic HEENT: PERRL Respiratory system:  no wheezes or crackles  Cardiovascular system: S1 & S2 heard, RRR.  Gastrointestinal system: Abdomen is nondistended,  soft and nontender. Central nervous system: Alert and oriented Extremities: Right AKA Skin: Scattered dry wounds on the left lower extremity GU: Foley   Data Reviewed: I have personally reviewed following labs and imaging studies  CBC: Recent Labs  Lab 09/13/24 1554 09/16/24 1639 09/17/24 0449  WBC 53.7* 54.1* 34.9*  HGB 4.7* 4.6* 7.6*  HCT 17.3* 16.5* 24.4*  MCV 72* 69.6* 74.2*  PLT 973* 902* 640*   Basic Metabolic Panel: Recent Labs  Lab 09/16/24 1639 09/16/24 1800 09/17/24 0449  NA 126*  --  130*  K 5.8*  --  4.7  CL 100  --  104  CO2 11*  --  14*  GLUCOSE 90  --  141*  BUN 76*  --  66*  CREATININE 2.65*  --  2.30*  CALCIUM  7.5*  --  6.8*  MG  --  1.5*  --      No results found for this or any previous visit (from  the past 240 hours).   Radiology Studies: US  Abdomen Limited RUQ (LIVER/GB) Result Date: 09/16/2024 CLINICAL DATA:  Transaminitis. EXAM: ULTRASOUND ABDOMEN LIMITED RIGHT UPPER QUADRANT COMPARISON:  None Available. FINDINGS: Gallbladder: Multiple shadowing, echogenic, mobile gallstones are seen within the gallbladder lumen. The largest measures approximately 1.3 cm. Gallbladder wall thickening is seen (7.0 mm). Pericholecystic fluid is also present. No sonographic Murphy sign noted by sonographer. Common bile duct: Diameter: 5.1 mm Liver: No focal lesion identified. Within normal limits in parenchymal echogenicity. Portal vein is patent on color Doppler imaging with normal direction of blood flow towards the liver. Other: Of incidental note is the presence of a right sided pleural effusion. IMPRESSION: 1. Cholelithiasis with additional findings consistent with sequelae associated with acute cholecystitis. 2. Right pleural effusion. Electronically Signed   By: Suzen Dials M.D.   On: 09/16/2024 23:35   CT ANGIO HEAD NECK W WO CM (CODE STROKE) Result Date: 09/16/2024 EXAM: CTA HEAD AND NECK WITH AND WITHOUT 09/16/2024 06:24:15 PM TECHNIQUE: CTA of the head and neck was performed with and without the administration of 50 mL of intravenous contrast (iohexol  (OMNIPAQUE ) 350 MG/ML injection 75 mL IOHEXOL  350 MG/ML SOLN). Multiplanar 2D and/or 3D reformatted images are provided for review. Automated exposure control, iterative reconstruction, and/or weight based adjustment of the mA/kV was utilized to reduce the radiation dose to as low as reasonably achievable. Stenosis of the internal carotid arteries measured using NASCET criteria. COMPARISON: CT angio head and neck 02/09/2024 at [facility] and CTA head and neck 03/04/2021 at [facility]. CLINICAL HISTORY: Neuro deficit, acute, stroke suspected. FINDINGS: CTA NECK: AORTIC ARCH AND ARCH VESSELS: Extensive atherosclerotic changes are again noted at  the aortic arch and great vessel origins. Severe stenosis of the left subclavian artery proximal to the vertebral artery has again noted. No dissection or arterial injury. No significant stenosis of the brachiocephalic artery. CERVICAL CAROTID ARTERIES: Atherosclerotic calcifications are present throughout the right common carotid artery without a significant stenosis relative to the more distal vessel. Atherosclerotic calcifications are present in the proximal right ICA without significant stenosis. Diffuse mural calcification are present throughout the left common carotid artery without significant stenosis. Patient motion is present through the area of stenosis previously noted in the proximal left ICA. The stenosis is not significantly changed. No dissection or arterial injury. CERVICAL VERTEBRAL ARTERIES: The left vertebral artery is occluded in the V1 and proximal left V2 segments. It is partially reconstituted in the neck. The right vertebral artery shows no dissection,  arterial injury, or significant stenosis in its cervical segments. LUNGS AND MEDIASTINUM: Unremarkable. SOFT TISSUES: No acute abnormality. BONES: Multilevel degenerative changes are again noted in the cervical spine. CTA HEAD: ANTERIOR CIRCULATION: Atherosclerotic calcifications are present within the cavernous internal carotid arteries bilaterally without significant stenoses through the ICA terminae. New left A1 segment is hypoplastic. No significant stenosis of the middle cerebral arteries. No aneurysm. POSTERIOR CIRCULATION: A fetal-type right cerebral artery is again noted. Moderate narrowing is present in the left P1 segment. Stable mild narrowing in the mid basilar artery is stable. Moderate narrowing is present in the distal vertebral arteries bilaterally. No aneurysm. OTHER: ECA branch vessels are opacified bilaterally. No dural venous sinus thrombosis on this non-dedicated study. IMPRESSION: 1. No large vessel occlusion or aneurysm  in the head or neck. 2. 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 3. 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 4. Moderate narrowing in the left P1 segment, distal vertebral arteries bilaterally, and stable mild narrowing in the mid basilar artery. 5. Extensive atherosclerotic changes as described. Findings were communicated to doctor Michaela via the amion system at 06:48 pm Electronically signed by: Lonni Necessary MD 09/16/2024 07:00 PM EDT RP Workstation: [id number]   CT HEAD CODE STROKE WO CONTRAST Result Date: 09/16/2024 EXAM: CT HEAD WITHOUT CONTRAST 09/16/2024 06:12:59 PM TECHNIQUE: CT of the head was performed without the administration of intravenous contrast. Automated exposure control, iterative reconstruction, and/or weight based adjustment of the mA/kV was utilized to reduce the radiation dose to as low as reasonably achievable. The study is mildly degraded by patient motion. COMPARISON: CT head without contrast 01/11/2024. MR head without contrast 03/09/2024 at [facility]. CLINICAL HISTORY: Neuro deficit, acute, stroke suspected. FINDINGS: BRAIN AND VENTRICLES: No acute hemorrhage. No evidence of acute infarct. No hydrocephalus. No extra-axial collection. No mass effect or midline shift. Remote lacunar infarcts are again noted within the right caudate head and internal capsule. Confluent Periventricular and scattered subcortical white matter hypoattenuation is moderately advanced for age. This most likely reflects the sequelae of chronic microvascular ischemia. Atherosclerotic calcifications are present in the cavernous carotid arteries bilaterally and at the dural margin of both vertebral arteries. No hyperdense vessel is present. ORBITS: No acute abnormality. SINUSES: No acute abnormality. SOFT TISSUES AND SKULL: No acute soft tissue abnormality. No skull fracture.  [place] stroke program early CT (aspect) score: Ganglionic (caudate, ic, Lentiform Nucleus, insula, M1-m3): 7 Supraganglionic (m4-m6): 3 Total: 10 IMPRESSION: 1. No acute intracranial abnormality.  ASPECTS 10/10 2. Remote lacunar infarcts in the right caudate head and internal capsule. 3. Moderately advanced confluent periventricular and scattered subcortical white matter hypoattenuation, likely sequelae of chronic microvascular ischemia. 4. Atherosclerotic calcifications in the cavernous carotid arteries bilaterally and at the dural margin of both vertebral arteries. No hyperdense vessel is present. The pertinent results were texted to Dr. Michaela via the [facility] system at 6:21 pm. Electronically signed by: Lonni Necessary MD 09/16/2024 06:23 PM EDT RP Workstation: [id number]    Scheduled Meds:  Chlorhexidine  Gluconate Cloth  6 each Topical Daily   escitalopram   20 mg Oral Daily   feeding supplement  237 mL Oral TID BM   methylPREDNISolone  (SOLU-MEDROL ) injection  60 mg Intravenous Q6H   sodium chloride  flush  3 mL Intravenous Q12H   sodium chloride  flush  3 mL Intravenous Q12H   Continuous Infusions:  sodium chloride      cefTRIAXone  (ROCEPHIN )  IV Stopped (09/17/24 0140)   famotidine (PEPCID) IV Stopped (09/17/24 0530)   metronidazole Stopped (09/17/24 0302)   sodium bicarbonate  150 mEq in dextrose  5 % 1,150 mL infusion 125 mL/hr at 09/17/24 0645     LOS: 1 day   Ivonne Mustache, MD [facility] P9/28/2025, 8:18 AM

## 2024-09-17 NOTE — ED Notes (Signed)
 Pt 88% on room air, placed on 2L now maintaining 95%

## 2024-09-17 NOTE — ED Notes (Signed)
 Patient in MRI

## 2024-09-17 NOTE — Progress Notes (Signed)
 STROKE TEAM PROGRESS NOTE    INTERIM HISTORY/SUBJECTIVE  Patient remains hemodynamically stable and afebrile with 1 episode of hypotension yesterday evening.  Hemoglobin has risen to 7.5 after transfusions.  OBJECTIVE  CBC    Component Value Date/Time   WBC 27.7 (H) 09/17/2024 0800   RBC 3.16 (L) 09/17/2024 0800   HGB 7.5 (L) 09/17/2024 0800   HGB 4.7 (LL) 09/13/2024 1554   HCT 23.4 (L) 09/17/2024 0800   HCT 17.3 (LL) 09/13/2024 1554   PLT 531 (H) 09/17/2024 0800   PLT 973 (HH) 09/13/2024 1554   MCV 74.1 (L) 09/17/2024 0800   MCV 72 (L) 09/13/2024 1554   MCH 23.7 (L) 09/17/2024 0800   MCHC 32.1 09/17/2024 0800   RDW 23.1 (H) 09/17/2024 0800   RDW 23.7 (H) 09/13/2024 1554   LYMPHSABS 1.0 09/07/2024 0042   LYMPHSABS 1.2 06/23/2023 1205   MONOABS 0.4 09/07/2024 0042   EOSABS 0.8 (H) 09/07/2024 0042   EOSABS 0.6 (H) 06/23/2023 1205   BASOSABS 0.2 (H) 09/07/2024 0042   BASOSABS 0.4 (H) 06/23/2023 1205    BMET    Component Value Date/Time   NA 130 (L) 09/17/2024 0800   NA 138 01/12/2024 1422   K 4.4 09/17/2024 0800   CL 102 09/17/2024 0800   CO2 17 (L) 09/17/2024 0800   GLUCOSE 159 (H) 09/17/2024 0800   BUN 65 (H) 09/17/2024 0800   BUN 28 (H) 01/12/2024 1422   CREATININE 2.22 (H) 09/17/2024 0800   CREATININE 1.44 (H) 08/03/2024 1024   CREATININE 1.33 (H) 03/03/2016 1531   CALCIUM  6.7 (L) 09/17/2024 0800   EGFR 31 (L) 01/12/2024 1422   GFRNONAA 23 (L) 09/17/2024 0800   GFRNONAA 39 (L) 08/03/2024 1024    IMAGING past 24 hours US  RENAL Result Date: 09/17/2024 CLINICAL DATA:  [id number] AKI (acute kidney injury) [id number] EXAM: RENAL / URINARY TRACT ULTRASOUND COMPLETE COMPARISON:  September 16, 2024 FINDINGS: Right Kidney: Renal measurements: 8.7 x 4.6 x 4.9 cm = volume: 101 mL. Echogenicity is mildly increased. No mass or hydronephrosis visualized. Left Kidney: Renal measurements: 10.3 x 3.6 x 4.8 cm = volume: 93 mL. Echogenicity is mildly increased. No mass or  hydronephrosis visualized. Bladder: Decompressed around a Foley catheter. Other: Cholelithiasis.  Splenomegaly.  Trace ascites. IMPRESSION: 1. No hydronephrosis. 2. Mildly increased renal echogenicity as can be seen in medical renal disease. 3. Splenomegaly. 4. Cholelithiasis. Electronically Signed   By: Corean Salter M.D.   On: 09/17/2024 12:57   US  Abdomen Limited RUQ (LIVER/GB) Result Date: 09/16/2024 CLINICAL DATA:  Transaminitis. EXAM: ULTRASOUND ABDOMEN LIMITED RIGHT UPPER QUADRANT COMPARISON:  None Available. FINDINGS: Gallbladder: Multiple shadowing, echogenic, mobile gallstones are seen within the gallbladder lumen. The largest measures approximately 1.3 cm. Gallbladder wall thickening is seen (7.0 mm). Pericholecystic fluid is also present. No sonographic Murphy sign noted by sonographer. Common bile duct: Diameter: 5.1 mm Liver: No focal lesion identified. Within normal limits in parenchymal echogenicity. Portal vein is patent on color Doppler imaging with normal direction of blood flow towards the liver. Other: Of incidental note is the presence of a right sided pleural effusion. IMPRESSION: 1. Cholelithiasis with additional findings consistent with sequelae associated with acute cholecystitis. 2. Right pleural effusion. Electronically Signed   By: Suzen Dials M.D.   On: 09/16/2024 23:35   CT ANGIO HEAD NECK W WO CM (CODE STROKE) Result Date: 09/16/2024 EXAM: CTA HEAD AND NECK WITH AND WITHOUT 09/16/2024 06:24:15 PM TECHNIQUE: CTA of the head and neck was performed  with and without the administration of 50 mL of intravenous contrast (iohexol  (OMNIPAQUE ) 350 MG/ML injection 75 mL IOHEXOL  350 MG/ML SOLN). Multiplanar 2D and/or 3D reformatted images are provided for review. Automated exposure control, iterative reconstruction, and/or weight based adjustment of the mA/kV was utilized to reduce the radiation dose to as low as reasonably achievable. Stenosis of the internal carotid arteries  measured using NASCET criteria. COMPARISON: CT angio head and neck 02/09/2024 at [facility] and CTA head and neck 03/04/2021 at [facility]. CLINICAL HISTORY: Neuro deficit, acute, stroke suspected. FINDINGS: CTA NECK: AORTIC ARCH AND ARCH VESSELS: Extensive atherosclerotic changes are again noted at the aortic arch and great vessel origins. Severe stenosis of the left subclavian artery proximal to the vertebral artery has again noted. No dissection or arterial injury. No significant stenosis of the brachiocephalic artery. CERVICAL CAROTID ARTERIES: Atherosclerotic calcifications are present throughout the right common carotid artery without a significant stenosis relative to the more distal vessel. Atherosclerotic calcifications are present in the proximal right ICA without significant stenosis. Diffuse mural calcification are present throughout the left common carotid artery without significant stenosis. Patient motion is present through the area of stenosis previously noted in the proximal left ICA. The stenosis is not significantly changed. No dissection or arterial injury. CERVICAL VERTEBRAL ARTERIES: The left vertebral artery is occluded in the V1 and proximal left V2 segments. It is partially reconstituted in the neck. The right vertebral artery shows no dissection, arterial injury, or significant stenosis in its cervical segments. LUNGS AND MEDIASTINUM: Unremarkable. SOFT TISSUES: No acute abnormality. BONES: Multilevel degenerative changes are again noted in the cervical spine. CTA HEAD: ANTERIOR CIRCULATION: Atherosclerotic calcifications are present within the cavernous internal carotid arteries bilaterally without significant stenoses through the ICA terminae. New left A1 segment is hypoplastic. No significant stenosis of the middle cerebral arteries. No aneurysm. POSTERIOR CIRCULATION: A fetal-type right cerebral artery is again noted. Moderate narrowing is present in the left P1 segment.  Stable mild narrowing in the mid basilar artery is stable. Moderate narrowing is present in the distal vertebral arteries bilaterally. No aneurysm. OTHER: ECA branch vessels are opacified bilaterally. No dural venous sinus thrombosis on this non-dedicated study. IMPRESSION: 1. No large vessel occlusion or aneurysm in the head or neck. 2. 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 3. 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 4. Moderate narrowing in the left P1 segment, distal vertebral arteries bilaterally, and stable mild narrowing in the mid basilar artery. 5. Extensive atherosclerotic changes as described. Findings were communicated to doctor Michaela via the amion system at 06:48 pm Electronically signed by: Lonni Necessary MD 09/16/2024 07:00 PM EDT RP Workstation: [id number]   CT HEAD CODE STROKE WO CONTRAST Result Date: 09/16/2024 EXAM: CT HEAD WITHOUT CONTRAST 09/16/2024 06:12:59 PM TECHNIQUE: CT of the head was performed without the administration of intravenous contrast. Automated exposure control, iterative reconstruction, and/or weight based adjustment of the mA/kV was utilized to reduce the radiation dose to as low as reasonably achievable. The study is mildly degraded by patient motion. COMPARISON: CT head without contrast 01/11/2024. MR head without contrast 03/09/2024 at [facility]. CLINICAL HISTORY: Neuro deficit, acute, stroke suspected. FINDINGS: BRAIN AND VENTRICLES: No acute hemorrhage. No evidence of acute infarct. No hydrocephalus. No extra-axial collection. No mass effect or midline shift. Remote lacunar infarcts are again noted within the right caudate head and internal capsule. Confluent Periventricular  and scattered subcortical white matter hypoattenuation is moderately advanced for age. This most likely reflects the sequelae of chronic microvascular ischemia.  Atherosclerotic calcifications are present in the cavernous carotid arteries bilaterally and at the dural margin of both vertebral arteries. No hyperdense vessel is present. ORBITS: No acute abnormality. SINUSES: No acute abnormality. SOFT TISSUES AND SKULL: No acute soft tissue abnormality. No skull fracture. [place] stroke program early CT (aspect) score: Ganglionic (caudate, ic, Lentiform Nucleus, insula, M1-m3): 7 Supraganglionic (m4-m6): 3 Total: 10 IMPRESSION: 1. No acute intracranial abnormality.  ASPECTS 10/10 2. Remote lacunar infarcts in the right caudate head and internal capsule. 3. Moderately advanced confluent periventricular and scattered subcortical white matter hypoattenuation, likely sequelae of chronic microvascular ischemia. 4. Atherosclerotic calcifications in the cavernous carotid arteries bilaterally and at the dural margin of both vertebral arteries. No hyperdense vessel is present. The pertinent results were texted to Dr. Michaela via the Amion system at 6:21 pm. Electronically signed by: Lonni Necessary MD 09/16/2024 06:23 PM EDT RP Workstation: [id number]    Vitals:   09/17/24 0415 09/17/24 0700 09/17/24 0720 09/17/24 1049  BP: (!) 118/58  (!) 120/55 103/66  Pulse: 85 85 81 77  Resp: 17 12 16 19   Temp:   98.2 F (36.8 C) 98 F (36.7 C)  TempSrc:      SpO2: 99% 99% 95% 95%  Weight:      Height:         PHYSICAL EXAM General: Chronically ill-appearing elderly patient in no acute distress, right AKA Psych:  Mood and affect appropriate for situation CV: Regular rate and rhythm on monitor Respiratory:  Regular, unlabored respirations on room air   NEURO:  Mental Status: AA&Ox3, patient is able to give some history Speech/Language: speech is without dysarthria or aphasia.  Naming, repetition, fluency, and comprehension intact.  Cranial Nerves:  II: PERRL. Visual fields full.  III, IV, VI: EOMI. Eyelids elevate symmetrically.  V: Sensation is intact to light  touch and symmetrical to face.  VII: Subtle right facial droop VIII: hearing intact to voice. IX, X: Phonation is normal.  KP:Dynloizm shrug stronger on the left XII: tongue is midline without fasciculations. Motor: 5/5 strength to left upper and lower extremities, 1/5 strength to right upper extremity, 3/5 strength to right lower extremity Tone: is normal and bulk is normal Sensation- Intact to light touch bilaterally.  Coordination: FTN intact on the left, unable to perform on the right Gait- deferred  Most Recent NIH  1a Level of Conscious.: 0 1b LOC Questions: 0 1c LOC Commands: 0 2 Best Gaze: 0 3 Visual: 0 4 Facial Palsy: 1 5a Motor Arm - left: 0 5b Motor Arm - Right: 3 6a Motor Leg - Left: 0 6b Motor Leg - Right: 0 7 Limb Ataxia: 0 8 Sensory: 0 9 Best Language: 0 10 Dysarthria: 0 11 Extinct. and Inatten.: 0 TOTAL: 4   ASSESSMENT/PLAN  [name] is a 73 y.o. female with history of A-fib on anticoagulation, stroke with residual right-sided hemiparesis and polycythemia vera who presented with an episode of aphasia and worsening right arm weakness.  MRI brain is pending.  She was recently seen by her urologist for hematuria and was found to have a hemoglobin of 4.6 on admission.  NIH on Admission 10  Possible left-sided stroke in the setting of hypoperfusion due to severe anemia Code Stroke CT head No acute abnormality. Small vessel disease. ASPECTS 10.    CTA head & neck no LVO, severe  left subclavian artery stenosis, left vertebral artery occlusion in V1 and V2 segments, moderate narrowing in left P1 segment, moderate narrowing in bilateral distal vertebral arteries and mild narrowing in mid basilar artery MRI pending 2D Echo pending Direct LDL pending HgbA1c 4.8 VTE prophylaxis -SCDs aspirin  81 mg daily and Eliquis  (apixaban ) daily prior to admission, now on No antithrombotic secondary to hematuria and severe anemia Therapy recommendations:   Pending Disposition: Pending  Atrial fibrillation Home Meds: Flecainide  50 mg twice daily, Eliquis  2.5 mg twice daily Continue telemetry monitoring Anticoagulation held in the setting of severe anemia with hematuria  Hypertension Home meds: Lisinopril  10 mg daily Stable Blood Pressure Goal: BP less than 220/110   Hyperlipidemia Home meds: Ezetimibe  10 mg daily Direct LDL pending High intensity statin not indicated due to previous intolerance, will consider PCSK9 inhibitor or Leqvio if LDL greater than goal Continue statin at discharge  Dysphagia Patient has post-stroke dysphagia, SLP consulted    Diet   Diet NPO time specified   Advance diet as tolerated  Anemia and hematuria Patient was recently seen by outpatient urologist for persistent hematuria Urology consulted, appreciate recommendations Patient was on Eliquis  2.5 mg twice daily at home 3 units PRBCs transfused Monitoring of hemoglobin and further transfusions per primary team  Other Stroke Risk Factors None   Other Active Problems AKI on CKD Transaminitis with AST 265 and ALT 164  Hospital day # 1  Patient seen by NP and then by MD, MD to edit note as needed. Maryem Shuffler E Everitt Clint Kill , MSN, AGACNP-BC [facility] See Amion for schedule and pager information 09/17/2024 2:05 PM    To contact Stroke Continuity provider, please refer to [email or web address]. After hours, contact General Neurology

## 2024-09-17 NOTE — Consult Note (Signed)
 Urology Consult   Physician requesting consult: Dr. Sundil  Reason for consult: hematuria  History of Present Illness: QUIANNA AVERY is a 73 y.o. with hx of polycythemia vera, Afib on Eliquis , CKD, CVA with R hemiparesis, PAD with prior stents (most recent 02/2024), R AKA, CAD, HF, HTN, and COPD who is consulted for acute blood loss anemia and hematuria. With possible bladder mass on CT scan from 9/12 (non-contrasted so unable to evaluate upper tract). On Eliquis  and ASA.   During my evaluation at the bedside patient is alert oriented however does not seems very interested with any conversation having a flat and blunted affect. Daughter at the bedside reported that for last few days patient has poor appetite not eating and drinking well while he ate some bite-size food. Patient does not have any hematuria after placing the Foley catheter in the ED. Daughter reported no other source of bleeding likely GI source   Urologically, has known history of hematuria and seen outpatient by Dr. Matilda at [facility] (09/13/24). At this time, plan was to proceed with cystoscopy. PVR 110ccs in office.   Hgb 4.6.  Cr 2.65 (baseline ~1.2-1.7).  Abdominal US  obtained but unfortunately unable to evaluate kidneys and bladders with available images.   Past Medical History:  Diagnosis Date   Acute blood loss anemia 05/04/2020   Acute ischemic stroke (HCC)    Acute respiratory failure with hypoxia (HCC) 01/29/2024   AKI (acute kidney injury)    Allergy ?, Don't remember   Arthritis    fingers, some in my knees (01/28/2016)   Atrial fibrillation with RVR, hx of PAF previously 04/27/2013   Paroxysmal atrial fibrillation     Formatting of this note might be different from the original.  Overview:   Paroxysmal atrial fibrillation     Last Assessment & Plan:   History of PAF maintaining sinus rhythm on flecainide  and Eliquis  oral anti-coagulation     Blood transfusion without reported diagnosis    During  surgery   CAD (coronary artery disease), per cath 04/27/13, non obstructive disease, EF 65-70% 05/11/2013   Carotid disease, bilateral 09/20/2013   Lt carotid 50-69% stentosis, less on rt   Cataract    Don't remember   Cellulitis of left leg 01/16/2022   Closed fracture of left tibial plateau 10/23/2018   Depression Don't remember   Dysrhythmia    Elevated troponin 05/04/2020   Encounter for support and coordination of transition of care 11/11/2021   Fatty liver    Fracture of ankle 10/23/2018   GERD (gastroesophageal reflux disease)    Glaucoma    H/O cardiovascular stress test 12/27/2009   negative for ischemia   Headache    occasional since eye pressure regulated (01/28/2016)   Hospital discharge follow-up 10/15/2021   Hyperlipidemia    Hypertension    Hypertensive crisis 01/08/2016   Formatting of this note might be different from the original.  hypertension     Last Assessment & Plan:   BP at goal and significantly improved from previous visit. Patient tolerating valsartan  and chlorthalidone  very well. Reports 1 episode of symptomatic hypotension that resolved after few hours. Denies falls or increase fatigue. Will continue current therapy without changes and follow up as nee   Hypertensive emergency 11/28/2022   Leukocytosis 07/15/2015   Migraine    stopped w/laser holes to relieve pressure in my eyes; had them 3-4 times/wk before taht (01/28/2016)   Mitral regurgitation,2+ by cath 05/11/2013   NSTEMI (non-ST elevated  myocardial infarction) (HCC) 04/27/2013   Osteoporosis IDK   Pain    BOTH KNEES - PT HAS TORN MENISCUS LEFT KNEE   Pain in both wrists 09/03/2021   Pain in left knee 10/03/2018   Paroxysmal atrial fibrillation (HCC)    hx of a fib on ASA  AND ELIQUIS     Pelvic fracture (HCC) 09/09/2018   Polycythemia vera (HCC) 08/20/2015   JAK-2 positive 07/19/15   Prosthesis adjustment 01/13/2019   PVD (peripheral vascular disease) with claudication 07/21/2006   previous  L ext iliac stent and rt SFA stent, known occluded Lt SFA   S/P cardiac cath 04/27/2013   NON OBSTRUCTIVE DISEASE, 2+ mr, ef 65-70%   Sacral fracture, closed (HCC) 05/04/2020   Statin intolerance    Stroke (HCC) 12/21/2004   SWELLING ALL OVER AND RT SIDE OF FACE DRAWN AND SPEECH SLURRED AND NUMBNESS ON RIGHT SIDE-- ALL RESOLVED   TIA (transient ischemic attack)    Trauma 05/04/2020    Past Surgical History:  Procedure Laterality Date   ABDOMINAL HYSTERECTOMY  1977   After childbirth   AMPUTATION Right 05/21/2017   Procedure: AMPUTATION ABOVE KNEE;  Surgeon: Oris Krystal FALCON, MD;  Location: [facility] OR;  Service: Vascular;  Laterality: Right;   CARDIAC CATHETERIZATION  04/27/2013   non occlusive disease with mild to mod. calcified lesions in ostial LM and proximal LAD and moderate prox. RC AND DISTAL AV GROOVE LCX, ef   CATARACT EXTRACTION W/PHACO Right 03/21/2015   Procedure: CATARACT EXTRACTION PHACO AND INTRAOCULAR LENS PLACEMENT RIGHT EYE;  Surgeon: Lynwood Hermann, MD;  Location: [facility] ORS;  Service: Ophthalmology;  Laterality: Right;  CDE:5.10   CATARACT EXTRACTION W/PHACO Left 05/16/2015   Procedure: CATARACT EXTRACTION PHACO AND INTRAOCULAR LENS PLACEMENT (IOC);  Surgeon: Lynwood Hermann, MD;  Location: [facility] ORS;  Service: Ophthalmology;  Laterality: Left;  CDE 5.57   CESAREAN SECTION  12/22/1975   COLONOSCOPY N/A 09/11/2016   Procedure: COLONOSCOPY;  Surgeon: Claudis RAYMOND Rivet, MD;  Location: [facility] END[facility] SUITE;  Service: Endoscopy;  Laterality: N/A;  730-moved to 1:00 Ann notified pt   EMBOLECTOMY Right 02/01/2016   Procedure: Thrombectomy of Right Femoral-Popliteal Bypass Graft; Endarterectomy of Right Below Knee Popliteal Artery and Tibial Peroneal Trunk with Bovine Pericardium Patch Angioplasty.;  Surgeon: Lonni GORMAN Blade, MD;  Location: [facility] OR;  Service: Vascular;  Laterality: Right;   EYE SURGERY     FEMORAL-POPLITEAL BYPASS GRAFT Right 01/31/2016   Procedure: BYPASS GRAFT RIGHT COMMON   FEMORALTO BELOW KNEE POPLITEAL ARTERY BYPASS GRAFT USING PROPATEN GORTEX GRAFT;  Surgeon: Lynwood JONETTA Collum, MD;  Location: [facility] OR;  Service: Vascular;  Laterality: Right;   FEMORAL-POPLITEAL BYPASS GRAFT Right 01/09/2017   Procedure: THROMBECTOMY OF RIGHT FEMORAL-POPLITEAL  ARTERY BYPASS GRAFT; THROMBECTOMY RIGHT  TIBIAL VESSELS;  Surgeon: Carlin FORBES Haddock, MD;  Location: [facility] OR;  Service: Vascular;  Laterality: Right;   FEMORAL-POPLITEAL BYPASS GRAFT Right 05/17/2017   Procedure: RIGHT FEMORAL-TIBIAL PERONEAL TRUNK ARTERY BYPASS GRAFT USING 24mmX80cm PROPATEN GRAFT WITH REMOVABLE RING;  Surgeon: Oris Krystal FALCON, MD;  Location: [facility] OR;  Service: Vascular;  Laterality: Right;   FRACTURE SURGERY     ILIAC ARTERY STENT  07/21/2006   external iliac and Rt SFA stent 07/2006 AND THE LEFT WAS IN 2006   KNEE ARTHROSCOPY WITH MEDIAL MENISECTOMY Left 03/27/2014   Procedure: left knee arthorscopy with medial chondraplasty of the medial femoral and patella, medial microfracture technique of medial femoral condyl;  Surgeon: Tanda DELENA Heading, MD;  Location:  [facility] ORS;  Service: Orthopedics;  Laterality: Left;   LEFT HEART CATHETERIZATION WITH CORONARY ANGIOGRAM Right 04/27/2013   Procedure: LEFT HEART CATHETERIZATION WITH CORONARY ANGIOGRAM;  Surgeon: Alm LELON Clay, MD;  Location: [facility] CATH LAB;  Service: Cardiovascular;  Laterality: Right;   LOWER EXTREMITY ANGIOGRAM Right 01/31/2016   Procedure: INTRAOP RIGHT LOWER EXTREMITY ANGIOGRAM;  Surgeon: Lynwood JONETTA Collum, MD;  Location: [facility] OR;  Service: Vascular;  Laterality: Right;   ORIF WRIST FRACTURE Right 12/21/1981   OVARIAN CYST REMOVAL Right    PATCH ANGIOPLASTY Right 01/31/2016   Procedure: RIGHT COMMON FEMORAL AND PROFUNDA FEMORIS ENDARECTOMY WITH PATCH ANGIOPLASTY;  Surgeon: Lynwood JONETTA Collum, MD;  Location: [facility] OR;  Service: Vascular;  Laterality: Right;   PATCH ANGIOPLASTY Right 01/09/2017   Procedure: PATCH ANGIOPLASTY RIGHT POPLITEAL ARTERY BYPASS GRAFT;  Surgeon:  Carlin FORBES Haddock, MD;  Location: [name] OR;  Service: Vascular;  Laterality: Right;   PERIPHERAL VASCULAR CATHETERIZATION N/A 06/27/2015   Procedure: Lower Extremity Angiography;  Surgeon: Dorn JINNY Lesches, MD;  Location: [facility] INVASIVE CV LAB;  Service: Cardiovascular;  Laterality: N/A;   PERIPHERAL VASCULAR CATHETERIZATION Bilateral 01/29/2016   Procedure: Lower Extremity Angiography;  Surgeon: Deatrice DELENA Cage, MD;  Location: [facility] INVAS[facility] CV LAB;  Service: Cardiovascular;  Laterality: Bilateral;   PERIPHERAL VASCULAR CATHETERIZATION N/A 01/29/2016   Procedure: Abdominal Aortogram;  Surgeon: Deatrice DELENA Cage, MD;  Location: [facility] INVAS[facility] CV LAB;  Service: Cardiovascular;  Laterality: N/A;   REFRACTIVE SURGERY Bilateral    6 in one eye; 7 in the other; to relieve pressure; not glaucoma (01/28/2016)   SFA Right 06/27/2015   overlapping Bahn covered stents   TUBAL LIGATION  12/21/1981   VEIN HARVEST Left 05/17/2017   Procedure: LEFT LEG GREATER SAPHENOUS VEIN HARVEST;  Surgeon: Oris Krystal FALCON, MD;  Location: [facility] OR;  Service: Vascular;  Laterality: Left;   VEIN REPAIR Right 01/31/2016   Procedure: RIGHT GREATER SAPHENOUS VEIN EXAMNED BUT NOT REMOVED;  Surgeon: Lynwood JONETTA Collum, MD;  Location: [facility] OR;  Service: Vascular;  Laterality: Right;     Current Hospital Medications:  Home meds:  No current facility-administered medications on file prior to encounter.   Current Outpatient Medications on File Prior to Encounter  Medication Sig Dispense Refill   amLODipine  (NORVASC ) 10 MG tablet TAKE ONE TABLET ONCE DAILY 90 tablet 0   apixaban  (ELIQUIS ) 2.5 MG TABS tablet Take 1 tablet (2.5 mg total) by mouth 2 (two) times daily. 180 tablet 0   aspirin  EC 81 MG tablet Take 1 tablet (81 mg total) by mouth daily. Swallow whole. 30 tablet 5   calcitRIOL (ROCALTROL) 0.25 MCG capsule Take 0.25 mcg by mouth 2 (two) times a week.     colchicine  0.6 MG tablet TAKE ONE TABLET ONCE DAILY 90 tablet 0   diclofenac  Sodium  (VOLTAREN ) 1 % GEL Apply 2 g topically 4 (four) times daily as needed (pain).     diltiazem  (CARDIZEM  CD) 240 MG 24 hr capsule Take 240 mg by mouth daily.     escitalopram  (LEXAPRO ) 20 MG tablet TAKE ONE TABLET ONCE DAILY 90 tablet 0   ezetimibe  (ZETIA ) 10 MG tablet Take 1 tablet (10 mg total) by mouth daily. 90 tablet 0   fenofibrate  (TRICOR ) 145 MG tablet Take 1 tablet (145 mg total) by mouth daily. 90 tablet 3   flecainide  (TAMBOCOR ) 50 MG tablet Take 50 mg by mouth 2 (two) times daily.     lisinopril  (ZESTRIL ) 10 MG tablet Take 1 tablet (  10 mg total) by mouth daily. 90 tablet 0   pantoprazole  (PROTONIX ) 40 MG tablet TAKE ONE TABLET ONCE DAILY 90 tablet 0   zinc  oxide 20 % ointment Apply 1 Application topically as needed for irritation.     alendronate  (FOSAMAX ) 70 MG tablet TAKE ONE TABLET ONCE WEEKLY (Patient not taking: Reported on 09/16/2024) 12 tablet 0   furosemide  (LASIX ) 40 MG tablet Take 1 tablet (40 mg total) by mouth daily as needed for fluid or edema. (Patient not taking: Reported on 09/16/2024)     sodium bicarbonate  650 MG tablet Take 1 tablet (650 mg total) by mouth 2 (two) times daily. (Patient not taking: Reported on 09/16/2024) 30 tablet 0     Scheduled Meds:  Chlorhexidine  Gluconate Cloth  6 each Topical Daily   escitalopram   20 mg Oral Daily   feeding supplement  237 mL Oral TID BM   methylPREDNISolone  (SOLU-MEDROL ) injection  60 mg Intravenous Q6H   sodium chloride  flush  3 mL Intravenous Q12H   sodium chloride  flush  3 mL Intravenous Q12H   Continuous Infusions:  sodium chloride      cefTRIAXone  (ROCEPHIN )  IV Stopped (09/17/24 0140)   famotidine (PEPCID) IV 20 mg (09/17/24 0449)   metronidazole Stopped (09/17/24 0302)   sodium bicarbonate  150 mEq in dextrose  5 % 1,150 mL infusion 125 mL/hr at 09/17/24 0426   PRN Meds:.sodium chloride , levalbuterol, methocarbamol, sodium chloride  flush, trimethobenzamide  Allergies:  Allergies  Allergen Reactions   Atenolol Rash  and Itching   Demerol   [Meperidine  Hcl] Itching   Demerol  [Meperidine ] Itching and Other (See Comments)    Unknown, can't remember    Gabapentin  Anxiety and Other (See Comments)    SI and HI   Inderal [Propranolol] Other (See Comments) and Itching    Doesn't remember    Prednisone Other (See Comments)    Muscle spasms   Statins Itching and Other (See Comments)    Simvastatin  - caused severe itching Pravastatin  - caused lesser tiching One type of statin? Caused stroke in 2006, and other symptoms as result   Warfarin And Related Other (See Comments)    Caused nose bleeds   Crestor [Rosuvastatin] Itching and Rash   Docosahexaenoic Acid (Dha) (Fish)     nosebleeds   Lactose Intolerance (Gi) Other (See Comments)    Bloating, gas   Lipitor [Atorvastatin ] Rash   Lovaza [Omega-3-Acid Ethyl Esters (Fish)] Other (See Comments)    nosebleeds    Penicillins Hives, Rash and Other (See Comments)    Immediate rash, facial/tongue/throat swelling, SOB or lightheadedness with hypotension Questionable high fever    Pravastatin  Itching and Rash   Simvastatin  Itching, Rash and Other (See Comments)   Trilipix [Choline Fenofibrate ] Other (See Comments)    Doesn't remember    Warfarin Other (See Comments)    nosebleeds   Hydralazine  Swelling   Effexor [Venlafaxine Hcl] Other (See Comments)    Doesn't remember    Nexium [Esomeprazole Magnesium ] Rash   Percocet [Oxycodone -Acetaminophen ] Other (See Comments)    Doesn't remember    Plavix  [Clopidogrel  Bisulfate] Rash    Family History  Problem Relation Age of Onset   CAD Mother    Lung cancer Mother    Heart attack Mother    Miscarriages / Stillbirths Mother    Obesity Mother    Stroke Mother    Vision loss Mother    CAD Father    Heart disease Father    Heart attack Father 34  died in hes 67's with heart disease   Hearing loss Father    Healthy Sister    Liver cancer Brother    Alcohol  abuse Brother    Arthritis Sister    CAD  Brother        has had CABG   CAD Brother    Cancer Maternal Aunt    Cancer Sister     Social History:  reports that she quit smoking about 11 years ago. Her smoking use included cigarettes. She started smoking about 56 years ago. She has a 45 pack-year smoking history. She has never used smokeless tobacco. She reports that she does not drink alcohol  and does not use drugs.  ROS: A complete review of systems was performed.  All systems are negative except for pertinent findings as noted.  Physical Exam:  Vital signs in last 24 hours: Temp:  [97.9 F (36.6 C)-99 F (37.2 C)] 99 F (37.2 C) (09/28 0345) Pulse Rate:  [74-89] 85 (09/28 0415) Resp:  [15-26] 17 (09/28 0415) BP: (79-128)/(42-81) 118/58 (09/28 0415) SpO2:  [92 %-100 %] 99 % (09/28 0415) Weight:  [41.7 kg] 41.7 kg (09/27 1910) Constitutional:  Alert and oriented, No acute distress Cardiovascular: Regular rate and rhythm, No JVD Respiratory: Normal respiratory effort, Lungs clear bilaterally GI: Abdomen is soft, nontender, nondistended, no abdominal masses GU: No CVA tenderness Lymphatic: No lymphadenopathy Neurologic: Grossly intact, no focal deficits Psychiatric: Normal mood and affect  Laboratory Data:  Recent Labs    09/16/24 1639  WBC 54.1*  HGB 4.6*  HCT 16.5*  PLT 902*    Recent Labs    09/16/24 1639  NA 126*  K 5.8*  CL 100  GLUCOSE 90  BUN 76*  CALCIUM  7.5*  CREATININE 2.65*     Results for orders placed or performed during the hospital encounter of 09/16/24 (from the past 24 hours)  Comprehensive metabolic panel     Status: Abnormal   Collection Time: 09/16/24  4:39 PM  Result Value Ref Range   Sodium 126 (L) 135 - 145 mmol/L   Potassium 5.8 (H) 3.5 - 5.1 mmol/L   Chloride 100 98 - 111 mmol/L   CO2 11 (L) 22 - 32 mmol/L   Glucose, Bld 90 70 - 99 mg/dL   BUN 76 (H) 8 - 23 mg/dL   Creatinine, Ser 7.34 (H) 0.44 - 1.00 mg/dL   Calcium  7.5 (L) 8.9 - 10.3 mg/dL   Total Protein 5.1 (L) 6.5  - 8.1 g/dL   Albumin  2.5 (L) 3.5 - 5.0 g/dL   AST 734 (H) 15 - 41 U/L   ALT 164 (H) 0 - 44 U/L   Alkaline Phosphatase 82 38 - 126 U/L   Total Bilirubin 0.8 0.0 - 1.2 mg/dL   GFR, Estimated 18 (L) >60 mL/min   Anion gap 15 5 - 15  CBC     Status: Abnormal   Collection Time: 09/16/24  4:39 PM  Result Value Ref Range   WBC 54.1 (HH) 4.0 - 10.5 K/uL   RBC 2.37 (L) 3.87 - 5.11 MIL/uL   Hemoglobin 4.6 (LL) 12.0 - 15.0 g/dL   HCT 83.4 (L) 63.9 - 53.9 %   MCV 69.6 (L) 80.0 - 100.0 fL   MCH 19.4 (L) 26.0 - 34.0 pg   MCHC 27.9 (L) 30.0 - 36.0 g/dL   RDW 76.0 (H) 88.4 - 84.4 %   Platelets 902 (HH) 150 - 400 K/uL   nRBC 0.8 (H) 0.0 - 0.2 %  CBG monitoring, ED     Status: None   Collection Time: 09/16/24  4:50 PM  Result Value Ref Range   Glucose-Capillary 79 70 - 99 mg/dL  Prepare RBC (crossmatch)     Status: None   Collection Time: 09/16/24  5:35 PM  Result Value Ref Range   Order Confirmation      ORDER PROCESSED BY BLOOD BANK Performed at [facility], [address] [place], [place] [address]   Type and screen [facility]     Status: None (Preliminary result)   Collection Time: 09/16/24  5:46 PM  Result Value Ref Range   ABO/RH(D) A POS    Antibody Screen NEG    Sample Expiration 09/19/2024,2359    Unit Number [id number]    Blood Component Type RED CELLS,LR    Unit division 00    Status of Unit ISSUED    Transfusion Status OK TO TRANSFUSE    Crossmatch Result Compatible    Unit Number [id number]    Blood Component Type RED CELLS,LR    Unit division 00    Status of Unit ISSUED    Transfusion Status OK TO TRANSFUSE    Crossmatch Result      Compatible Performed at [facility], [address], [place], [place] [address]    Unit Number [id number]    Blood Component Type RED CELLS,LR    Unit division 00    Status of Unit ALLOCATED    Transfusion Status OK TO TRANSFUSE    Crossmatch Result Compatible   CBG monitoring, ED     Status:  None   Collection Time: 09/16/24  5:51 PM  Result Value Ref Range   Glucose-Capillary 76 70 - 99 mg/dL  Magnesium      Status: Abnormal   Collection Time: 09/16/24  6:00 PM  Result Value Ref Range   Magnesium  1.5 (L) 1.7 - 2.4 mg/dL  Protime-INR     Status: Abnormal   Collection Time: 09/16/24  6:00 PM  Result Value Ref Range   Prothrombin Time 25.6 (H) 11.4 - 15.2 seconds   INR 2.2 (H) 0.8 - 1.2  APTT     Status: Abnormal   Collection Time: 09/16/24  6:00 PM  Result Value Ref Range   aPTT 48 (H) 24 - 36 seconds  Ethanol     Status: None   Collection Time: 09/16/24  6:00 PM  Result Value Ref Range   Alcohol , Ethyl (B) <15 <15 mg/dL  T4, free     Status: None   Collection Time: 09/16/24  6:00 PM  Result Value Ref Range   Free T4 0.80 0.61 - 1.12 ng/dL  Osmolality     Status: Abnormal   Collection Time: 09/16/24  6:00 PM  Result Value Ref Range   Osmolality 297 (H) 275 - 295 mOsm/kg  CBG monitoring, ED     Status: None   Collection Time: 09/16/24  6:34 PM  Result Value Ref Range   Glucose-Capillary 89 70 - 99 mg/dL  Creatinine, urine, random     Status: None   Collection Time: 09/16/24  7:44 PM  Result Value Ref Range   Creatinine, Urine 54 mg/dL  Sodium, urine, random     Status: None   Collection Time: 09/16/24  7:44 PM  Result Value Ref Range   Sodium, Ur <30 mmol/L  Urinalysis, Routine w reflex microscopic -Urine, Clean Catch     Status: Abnormal   Collection Time: 09/16/24  7:44 PM  Result Value Ref Range   Color, Urine YELLOW YELLOW   APPearance CLOUDY (A) CLEAR   Specific Gravity, Urine 1.017 1.005 - 1.030   pH 5.0 5.0 - 8.0   Glucose, UA NEGATIVE NEGATIVE mg/dL   Hgb urine dipstick MODERATE (A) NEGATIVE   Bilirubin Urine NEGATIVE NEGATIVE   Ketones, ur NEGATIVE NEGATIVE mg/dL   Protein, ur 899 (A) NEGATIVE mg/dL   Nitrite NEGATIVE NEGATIVE   Leukocytes,Ua LARGE (A) NEGATIVE   RBC / HPF 6-10 0 - 5 RBC/hpf   WBC, UA >50 0 - 5 WBC/hpf   Bacteria, UA RARE (A)  NONE SEEN   Squamous Epithelial / HPF 0-5 0 - 5 /HPF   WBC Clumps PRESENT    Mucus PRESENT    Hyaline Casts, UA PRESENT   Osmolality, urine     Status: None   Collection Time: 09/16/24  7:44 PM  Result Value Ref Range   Osmolality, Ur 353 300 - 900 mOsm/kg  Prepare RBC (crossmatch)     Status: None   Collection Time: 09/16/24  8:00 PM  Result Value Ref Range   Order Confirmation      ORDER PROCESSED BY BLOOD BANK Performed at [facility], [address] [place], [place] [address]   Urinalysis, Routine w reflex microscopic -Urine, Clean Catch     Status: Abnormal   Collection Time: 09/17/24  2:55 AM  Result Value Ref Range   Color, Urine YELLOW YELLOW   APPearance CLOUDY (A) CLEAR   Specific Gravity, Urine 1.020 1.005 - 1.030   pH 5.0 5.0 - 8.0   Glucose, UA NEGATIVE NEGATIVE mg/dL   Hgb urine dipstick MODERATE (A) NEGATIVE   Bilirubin Urine NEGATIVE NEGATIVE   Ketones, ur NEGATIVE NEGATIVE mg/dL   Protein, ur 899 (A) NEGATIVE mg/dL   Nitrite NEGATIVE NEGATIVE   Leukocytes,Ua LARGE (A) NEGATIVE   RBC / HPF 11-20 0 - 5 RBC/hpf   WBC, UA >50 0 - 5 WBC/hpf   Bacteria, UA RARE (A) NONE SEEN   Squamous Epithelial / HPF 0-5 0 - 5 /HPF   WBC Clumps PRESENT    Mucus PRESENT    Hyaline Casts, UA PRESENT    *Note: Due to a large number of results and/or encounters for the requested time period, some results have not been displayed. A complete set of results can be found in Results Review.   No results found for this or any previous visit (from the past 240 hours).  Renal Function: Recent Labs    09/16/24 1639  CREATININE 2.65*   Estimated Creatinine Clearance: 12.4 mL/min (A) (by C-G formula based on SCr of 2.65 mg/dL (H)).  Radiologic Imaging: US  Abdomen Limited RUQ (LIVER/GB) Result Date: 09/16/2024 CLINICAL DATA:  Transaminitis. EXAM: ULTRASOUND ABDOMEN LIMITED RIGHT UPPER QUADRANT COMPARISON:  None Available. FINDINGS: Gallbladder: Multiple shadowing, echogenic,  mobile gallstones are seen within the gallbladder lumen. The largest measures approximately 1.3 cm. Gallbladder wall thickening is seen (7.0 mm). Pericholecystic fluid is also present. No sonographic Murphy sign noted by sonographer. Common bile duct: Diameter: 5.1 mm Liver: No focal lesion identified. Within normal limits in parenchymal echogenicity. Portal vein is patent on color Doppler imaging with normal direction of blood flow towards the liver. Other: Of incidental note is the presence of a right sided pleural effusion. IMPRESSION: 1. Cholelithiasis with additional findings consistent with sequelae associated with acute cholecystitis. 2. Right pleural effusion. Electronically Signed   By: Suzen Dwane HERO.D.  On: 09/16/2024 23:35   CT ANGIO HEAD NECK W WO CM (CODE STROKE) Result Date: 09/16/2024 EXAM: CTA HEAD AND NECK WITH AND WITHOUT 09/16/2024 06:24:15 PM TECHNIQUE: CTA of the head and neck was performed with and without the administration of 50 mL of intravenous contrast (iohexol  (OMNIPAQUE ) 350 MG/ML injection 75 mL IOHEXOL  350 MG/ML SOLN). Multiplanar 2D and/or 3D reformatted images are provided for review. Automated exposure control, iterative reconstruction, and/or weight based adjustment of the mA/kV was utilized to reduce the radiation dose to as low as reasonably achievable. Stenosis of the internal carotid arteries measured using NASCET criteria. COMPARISON: CT angio head and neck 02/09/2024 at [facility] and CTA head and neck 03/04/2021 at [facility] CLINICAL HISTORY: Neuro deficit, acute, stroke suspected. FINDINGS: CTA NECK: AORTIC ARCH AND ARCH VESSELS: Extensive atherosclerotic changes are again noted at the aortic arch and great vessel origins. Severe stenosis of the left subclavian artery proximal to the vertebral artery has again noted. No dissection or arterial injury. No significant stenosis of the brachiocephalic artery. CERVICAL CAROTID ARTERIES: Atherosclerotic  calcifications are present throughout the right common carotid artery without a significant stenosis relative to the more distal vessel. Atherosclerotic calcifications are present in the proximal right ICA without significant stenosis. Diffuse mural calcification are present throughout the left common carotid artery without significant stenosis. Patient motion is present through the area of stenosis previously noted in the proximal left ICA. The stenosis is not significantly changed. No dissection or arterial injury. CERVICAL VERTEBRAL ARTERIES: The left vertebral artery is occluded in the V1 and proximal left V2 segments. It is partially reconstituted in the neck. The right vertebral artery shows no dissection, arterial injury, or significant stenosis in its cervical segments. LUNGS AND MEDIASTINUM: Unremarkable. SOFT TISSUES: No acute abnormality. BONES: Multilevel degenerative changes are again noted in the cervical spine. CTA HEAD: ANTERIOR CIRCULATION: Atherosclerotic calcifications are present within the cavernous internal carotid arteries bilaterally without significant stenoses through the ICA terminae. New left A1 segment is hypoplastic. No significant stenosis of the middle cerebral arteries. No aneurysm. POSTERIOR CIRCULATION: A fetal-type right cerebral artery is again noted. Moderate narrowing is present in the left P1 segment. Stable mild narrowing in the mid basilar artery is stable. Moderate narrowing is present in the distal vertebral arteries bilaterally. No aneurysm. OTHER: ECA branch vessels are opacified bilaterally. No dural venous sinus thrombosis on this non-dedicated study. IMPRESSION: 1. No large vessel occlusion or aneurysm in the head or neck. 2. 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 3. 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 4. Moderate narrowing in the left P1  segment, distal vertebral arteries bilaterally, and stable mild narrowing in the mid basilar artery. 5. Extensive atherosclerotic changes as described. Findings were communicated to doctor Michaela via the amion system at 06:48 pm Electronically signed by: Lonni Necessary MD 09/16/2024 07:00 PM EDT RP Workstation: [id number]   CT HEAD CODE STROKE WO CONTRAST Result Date: 09/16/2024 EXAM: CT HEAD WITHOUT CONTRAST 09/16/2024 06:12:59 PM TECHNIQUE: CT of the head was performed without the administration of intravenous contrast. Automated exposure control, iterative reconstruction, and/or weight based adjustment of the mA/kV was utilized to reduce the radiation dose to as low as reasonably achievable. The study is mildly degraded by patient motion. COMPARISON: CT head without contrast 01/11/2024. MR head without contrast 03/09/2024 at [facility]. CLINICAL HISTORY: Neuro deficit, acute, stroke suspected. FINDINGS: BRAIN  AND VENTRICLES: No acute hemorrhage. No evidence of acute infarct. No hydrocephalus. No extra-axial collection. No mass effect or midline shift. Remote lacunar infarcts are again noted within the right caudate head and internal capsule. Confluent Periventricular and scattered subcortical white matter hypoattenuation is moderately advanced for age. This most likely reflects the sequelae of chronic microvascular ischemia. Atherosclerotic calcifications are present in the cavernous carotid arteries bilaterally and at the dural margin of both vertebral arteries. No hyperdense vessel is present. ORBITS: No acute abnormality. SINUSES: No acute abnormality. SOFT TISSUES AND SKULL: No acute soft tissue abnormality. No skull fracture. [place] stroke program early CT (aspect) score: Ganglionic (caudate, ic, Lentiform Nucleus, insula, M1-m3): 7 Supraganglionic (m4-m6): 3 Total: 10 IMPRESSION: 1. No acute intracranial abnormality.  ASPECTS 10/10 2. Remote lacunar infarcts in the right caudate head and  internal capsule. 3. Moderately advanced confluent periventricular and scattered subcortical white matter hypoattenuation, likely sequelae of chronic microvascular ischemia. 4. Atherosclerotic calcifications in the cavernous carotid arteries bilaterally and at the dural margin of both vertebral arteries. No hyperdense vessel is present. The pertinent results were texted to Dr. Michaela via the Amion system at 6:21 pm. Electronically signed by: Lonni Necessary MD 09/16/2024 06:23 PM EDT RP Workstation: [id number]   I independently reviewed the above imaging studies.  Impression: 28F with complex history who is consulted in setting of hematuria and acute blood loss anemia. On evaluation, Foley catheter in place draining clear.   #Hematuria Overall, I do not suspect that hematuria is driving acute blood loss anemia and would continue to workup possible other sources. Will defer workup of possible bladder mass to outpatient Urologist at this time.  -Continue Foley catheter. Continue to trend Cr.  -Obtain formal bladder renal ultrasound to evaluate for any clot burden in bladder as well to evaluate for hydronephrosis in setting of AKI.  -Patient will need delayed imaging to evaluate upper tracts at some point. In setting of recent AKI, would defer in the acute setting.  -Workup of possible bladder mass as outpatient.  -Please re-consult Urology if hematuria worsens with clots and clotting off catheter.   Annica Marinello N Aneesah Hernan 09/17/2024, 5:14 AM

## 2024-09-18 ENCOUNTER — Encounter (HOSPITAL_COMMUNITY): Payer: Self-pay | Admitting: Internal Medicine

## 2024-09-18 ENCOUNTER — Inpatient Hospital Stay (HOSPITAL_COMMUNITY)

## 2024-09-18 DIAGNOSIS — I639 Cerebral infarction, unspecified: Secondary | ICD-10-CM

## 2024-09-18 DIAGNOSIS — N3289 Other specified disorders of bladder: Secondary | ICD-10-CM | POA: Diagnosis not present

## 2024-09-18 DIAGNOSIS — N029 Recurrent and persistent hematuria with unspecified morphologic changes: Secondary | ICD-10-CM

## 2024-09-18 DIAGNOSIS — I779 Disorder of arteries and arterioles, unspecified: Secondary | ICD-10-CM

## 2024-09-18 DIAGNOSIS — D72829 Elevated white blood cell count, unspecified: Secondary | ICD-10-CM | POA: Diagnosis not present

## 2024-09-18 DIAGNOSIS — I6389 Other cerebral infarction: Secondary | ICD-10-CM

## 2024-09-18 DIAGNOSIS — I6350 Cerebral infarction due to unspecified occlusion or stenosis of unspecified cerebral artery: Secondary | ICD-10-CM | POA: Diagnosis not present

## 2024-09-18 DIAGNOSIS — M319 Necrotizing vasculopathy, unspecified: Secondary | ICD-10-CM | POA: Diagnosis not present

## 2024-09-18 DIAGNOSIS — D649 Anemia, unspecified: Secondary | ICD-10-CM | POA: Diagnosis not present

## 2024-09-18 LAB — URINE CULTURE: Culture: NO GROWTH

## 2024-09-18 LAB — BASIC METABOLIC PANEL WITH GFR
Anion gap: 12 (ref 5–15)
BUN: 62 mg/dL — ABNORMAL HIGH (ref 8–23)
CO2: 19 mmol/L — ABNORMAL LOW (ref 22–32)
Calcium: 6.7 mg/dL — ABNORMAL LOW (ref 8.9–10.3)
Chloride: 101 mmol/L (ref 98–111)
Creatinine, Ser: 2.14 mg/dL — ABNORMAL HIGH (ref 0.44–1.00)
GFR, Estimated: 24 mL/min — ABNORMAL LOW (ref >60)
Glucose, Bld: 120 mg/dL — ABNORMAL HIGH (ref 70–99)
Potassium: 3.8 mmol/L (ref 3.5–5.1)
Sodium: 132 mmol/L — ABNORMAL LOW (ref 135–145)

## 2024-09-18 LAB — ECHOCARDIOGRAM COMPLETE
Area-P 1/2: 3.31 cm2
Height: 64 in
MV VTI: 2.03 cm2
S' Lateral: 2.8 cm
Weight: 1470.91 [oz_av]

## 2024-09-18 LAB — GLUCOSE, CAPILLARY
Glucose-Capillary: 99 mg/dL (ref 70–99)
Glucose-Capillary: 155 mg/dL — ABNORMAL HIGH (ref 70–99)
Glucose-Capillary: 91 mg/dL (ref 70–99)

## 2024-09-18 LAB — CBC
HCT: 24.7 % — ABNORMAL LOW (ref 36.0–46.0)
Hemoglobin: 8.1 g/dL — ABNORMAL LOW (ref 12.0–15.0)
MCH: 24 pg — ABNORMAL LOW (ref 26.0–34.0)
MCHC: 32.8 g/dL (ref 30.0–36.0)
MCV: 73.1 fL — ABNORMAL LOW (ref 80.0–100.0)
Platelets: 521 K/uL — ABNORMAL HIGH (ref 150–400)
RBC: 3.38 MIL/uL — ABNORMAL LOW (ref 3.87–5.11)
RDW: 23.7 % — ABNORMAL HIGH (ref 11.5–15.5)
WBC: 36.7 K/uL — ABNORMAL HIGH (ref 4.0–10.5)
nRBC: 0.5 % — ABNORMAL HIGH (ref 0.0–0.2)

## 2024-09-18 LAB — MAGNESIUM: Magnesium: 1.4 mg/dL — ABNORMAL LOW (ref 1.7–2.4)

## 2024-09-18 MED ORDER — TECHNETIUM TC 99M MEBROFENIN IV KIT
4.9000 | PACK | Freq: Once | INTRAVENOUS | Status: AC
Start: 2024-09-18 — End: 2024-09-18
  Administered 2024-09-18: 4.9 via INTRAVENOUS

## 2024-09-18 MED ORDER — MAGNESIUM SULFATE 4 GM/100ML IV SOLN
4.0000 g | Freq: Once | INTRAVENOUS | Status: AC
  Administered 2024-09-18: 4 g via INTRAVENOUS
  Filled 2024-09-18: qty 100

## 2024-09-18 MED ORDER — POTASSIUM CHLORIDE CRYS ER 20 MEQ PO TBCR
20.0000 meq | EXTENDED_RELEASE_TABLET | Freq: Once | ORAL | Status: AC
  Administered 2024-09-18: 20 meq via ORAL
  Filled 2024-09-18: qty 1

## 2024-09-18 MED ORDER — ACETAMINOPHEN 325 MG PO TABS
650.0000 mg | ORAL_TABLET | Freq: Four times a day (QID) | ORAL | Status: DC | PRN
  Administered 2024-09-18: 650 mg via ORAL
  Filled 2024-09-18: qty 2

## 2024-09-18 MED ORDER — HEPARIN (PORCINE) 25000 UT/250ML-% IV SOLN
600.0000 [IU]/h | INTRAVENOUS | Status: AC
Start: 2024-09-18 — End: 2024-09-19
  Administered 2024-09-18: 500 [IU]/h via INTRAVENOUS
  Filled 2024-09-18: qty 250

## 2024-09-18 MED ORDER — STROKE: EARLY STAGES OF RECOVERY BOOK
Freq: Once | Status: AC
  Filled 2024-09-18: qty 1

## 2024-09-18 MED ORDER — PERFLUTREN LIPID MICROSPHERE
1.0000 mL | INTRAVENOUS | Status: AC | PRN
  Administered 2024-09-18: 3 mL via INTRAVENOUS

## 2024-09-18 NOTE — Progress Notes (Signed)
 STROKE TEAM PROGRESS NOTE    INTERIM HISTORY/SUBJECTIVE No family at bedside.  Patient sitting in chair, eating soup.  No acute event overnight.  Neuro stable.  Carotid Doppler showed left ICA high-grade stenosis.  OBJECTIVE  CBC    Component Value Date/Time   WBC 36.7 (H) 09/18/2024 0455   RBC 3.38 (L) 09/18/2024 0455   HGB 8.1 (L) 09/18/2024 0455   HGB 4.7 (LL) 09/13/2024 1554   HCT 24.7 (L) 09/18/2024 0455   HCT 17.3 (LL) 09/13/2024 1554   PLT 521 (H) 09/18/2024 0455   PLT 973 (HH) 09/13/2024 1554   MCV 73.1 (L) 09/18/2024 0455   MCV 72 (L) 09/13/2024 1554   MCH 24.0 (L) 09/18/2024 0455   MCHC 32.8 09/18/2024 0455   RDW 23.7 (H) 09/18/2024 0455   RDW 23.7 (H) 09/13/2024 1554   LYMPHSABS 0.3 (L) 09/17/2024 1756   LYMPHSABS 1.2 06/23/2023 1205   MONOABS 0.0 (L) 09/17/2024 1756   EOSABS 0.0 09/17/2024 1756   EOSABS 0.6 (H) 06/23/2023 1205   BASOSABS 0.7 (H) 09/17/2024 1756   BASOSABS 0.4 (H) 06/23/2023 1205    BMET    Component Value Date/Time   NA 132 (L) 09/18/2024 0455   NA 138 01/12/2024 1422   K 3.8 09/18/2024 0455   CL 101 09/18/2024 0455   CO2 19 (L) 09/18/2024 0455   GLUCOSE 120 (H) 09/18/2024 0455   BUN 62 (H) 09/18/2024 0455   BUN 28 (H) 01/12/2024 1422   CREATININE 2.14 (H) 09/18/2024 0455   CREATININE 1.44 (H) 08/03/2024 1024   CREATININE 1.33 (H) 03/03/2016 1531   CALCIUM  6.7 (L) 09/18/2024 0455   EGFR 31 (L) 01/12/2024 1422   GFRNONAA 24 (L) 09/18/2024 0455   GFRNONAA 39 (L) 08/03/2024 1024    IMAGING past 24 hours MR BRAIN WO CONTRAST Result Date: 09/17/2024 EXAM: MRI BRAIN WITHOUT CONTRAST 09/17/2024 03:19:33 PM TECHNIQUE: Multiplanar multisequence MRI of the head/brain was performed without the administration of intravenous contrast. COMPARISON: None available. CLINICAL HISTORY: Concern for acute ischemic stroke. Tia with aphasia and weakness. Anemia. FINDINGS: BRAIN AND VENTRICLES: Punctate focus of diffusion abnormality is present in the  anterior right thalamus. Focal restricted diffusion is present in the left occipital lobe. No intracranial hemorrhage. No mass. No midline shift. No hydrocephalus. Confluent Periventricular and scattered subcortical T2 hyperintensities bilaterally are moderately advanced for age. A remote lacunar infarct is present in the anterior right lentiform nucleus. A remote left paramedian pontine infarct demonstrates expected interval change. The sella is unremarkable. Normal flow voids. ORBITS: No acute abnormality. SINUSES AND MASTOIDS: No acute abnormality. BONES AND SOFT TISSUES: Normal marrow signal. No acute soft tissue abnormality. IMPRESSION: 1. Acute punctate diffusion abnormality in the anterior right thalamus and focal restricted diffusion in the left occipital lobe, consistent with focal acute infarcts. 2. Moderately advanced confluent periventricular and scattered subcortical T2 hyperintensities bilaterally. 3. Remote lacunar infarct in the anterior right lentiform nucleus. 4. Remote left paramedian pontine infarct with expected interval change. Electronically signed by: Lonni Necessary MD 09/17/2024 03:58 PM EDT RP Workstation: [id number]   US  RENAL Result Date: 09/17/2024 CLINICAL DATA:  [id number] AKI (acute kidney injury) [id number] EXAM: RENAL / URINARY TRACT ULTRASOUND COMPLETE COMPARISON:  September 16, 2024 FINDINGS: Right Kidney: Renal measurements: 8.7 x 4.6 x 4.9 cm = volume: 101 mL. Echogenicity is mildly increased. No mass or hydronephrosis visualized. Left Kidney: Renal measurements: 10.3 x 3.6 x 4.8 cm = volume: 93 mL. Echogenicity is mildly increased. No mass or hydronephrosis visualized.  Bladder: Decompressed around a Foley catheter. Other: Cholelithiasis.  Splenomegaly.  Trace ascites. IMPRESSION: 1. No hydronephrosis. 2. Mildly increased renal echogenicity as can be seen in medical renal disease. 3. Splenomegaly. 4. Cholelithiasis. Electronically Signed   By: Corean Salter M.D.   On:  09/17/2024 12:57    Vitals:   09/18/24 0000 09/18/24 0400 09/18/24 0800 09/18/24 0832  BP:   (!) 111/57 (!) 123/57  Pulse:   88 86  Resp:   15 15  Temp: 97.8 F (36.6 C) 98.2 F (36.8 C) 98.2 F (36.8 C) 98.4 F (36.9 C)  TempSrc:    Oral  SpO2:   92% 94%  Weight:      Height:         PHYSICAL EXAM General: Chronically ill-appearing elderly patient in no acute distress, right AKA Psych:  Mood and affect appropriate for situation CV: Regular rate and rhythm on monitor Respiratory:  Regular, unlabored respirations on room air   NEURO:  Mental Status: AA&Ox3, patient is able to give some history Speech/Language: speech is without dysarthria or aphasia.  Naming, repetition, fluency, and comprehension intact.  Cranial Nerves:  II: PERRL. Visual fields full.  III, IV, VI: EOMI. Eyelids elevate symmetrically.  V: Sensation is intact to light touch and symmetrical to face.  VII: Subtle right facial droop VIII: hearing intact to voice. IX, X: Phonation is normal.  KP:Dynloizm shrug stronger on the left XII: tongue is midline without fasciculations. Motor: 5/5 strength to left upper and lower extremities, 0/5 strength to right upper extremity proximal and mild movement of 2-3 fingers on the R hand, 3/5 strength to right lower extremity Tone: is normal and bulk is normal Sensation- Intact to light touch bilaterally.  Coordination: FTN intact on the left, unable to perform on the right Gait- deferred   ASSESSMENT/PLAN  [name] is a 73 y.o. female with history of A-fib on anticoagulation, stroke with residual right-sided hemiparesis and polycythemia vera who presented with an episode of aphasia and worsening right arm weakness.  MRI brain is pending.  She was recently seen by her urologist for hematuria and was found to have a hemoglobin of 4.6 on admission.  NIH on Admission 10  Stroke: two punctate infarct at R thalamus and left occipital, etiology likely due to  advanced vasculopathy in the setting of severe anemia Code Stroke CT head No acute abnormality. Small vessel disease. ASPECTS 10.    CTA head & neck no LVO, severe left subclavian artery stenosis, left vertebral artery occlusion in V1 and V2 segments, moderate narrowing in left P1 segment, moderate narrowing in bilateral distal vertebral arteries and mild narrowing in mid basilar artery MRI Acute punctate diffusion abnormality in the anterior right thalamus and focal restricted diffusion in the left occipital lobe, consistent with focal acute infarcts. Remote lacunar infarct in the anterior right lentiform nucleus and left paramedian pons 2D Echo pending Carotid doppler 60-79% right ICA stenosis and 80-99% left ICA stenosis.  Direct LDL pending, HDL < 10 HgbA1c 4.8 VTE prophylaxis - SCDs aspirin  81 mg daily and Eliquis  (apixaban ) daily prior to admission, now on No antithrombotic secondary to hematuria and severe anemia Therapy recommendations:  Pending Disposition: Pending  Severe anemia Leukocytosis and thrombocytosis Hb 4.7--4.6--s/p PRBC -- 7.5--8.1 WBC 53.7--34.9--33.7--36.7 Platelet 973--640--592--570--521 Hematology on board No bone marrow biopsy needed at this time per hamatology Close monitoring On Rocephin  and off Flagyl  Carotid stenosis 09/2021 carotid Doppler left ICA 70% stenosis, right ICA 50 to  69% stenosis.   10/2021 MRA neck right ICA 30 to 40% stenosis, left ICA 70% stenosis.  CTA head and neck left ICA 60% stenosis. 01/2024 CT head and neck 50% left ICA stenosis, 55% right ICA stenosis.  This admission CUS R ICA 60-79% and left ICA 80-99% stenosis.  Still more asymptomatic at this time Will need close outpt VVS follow up, she sees Dr. Lanis as outpt  Hx of stroke 09/2021 admitted for tiny right SO infarct.  MRI showed bilateral A2 moderate stenosis, right P1/P2 stenosis, carotid Doppler left ICA 70% stenosis, right ICA 50 to 69% stenosis.  EF 70 to 75%.  Discharged  on Eliquis . 10/2021 admitted for transient left sided numbness.  MRI negative.  MRA head and neck showed left subclavian steal, right ICA 30 to 40% stenosis, left ICA 70% stenosis.  CTA head and neck showed left subclavian severe stenosis proximal to via origin.  Left VA occlusion with distal reconstitution.  Left ICA 60% stenosis. 01/2024 admitted for acute left pontine infarct and subacute bilateral hemispheric infarct.  CT head and neck 50% left ICA stenosis, 55% right ICA stenosis.  Innominate artery 70% stenosis.  Left subclavian severe stenosis with left VA occlusion.  50% bilateral ICA siphon stenosis.  EF 50 to 55%. Residue RUE plegia and RLE paresis 02/2024 status post left subclavian stent  Atrial fibrillation Home Meds: Flecainide  50 mg twice daily, Eliquis  2.5 mg twice daily Continue telemetry monitoring Anticoagulation held in the setting of severe anemia with hematuria  Hypertension Home meds: Lisinopril  10 mg daily Stable Avoid low BP Long-term BP goal normotensive  Hyperlipidemia Home meds: Ezetimibe  10 mg daily and Tricor  145 Direct LDL pending Patient intolerant to statin Transaminitis with AST/ALT 265/164--115/127 Resume Zetia  and Tricor  once LFT normalizes Continue on discharge  Dysphagia Patient has post-stroke dysphagia SLP consulted Now on clear liquid diet Advance diet if able  Hematuria Patient was recently seen by outpatient urologist for persistent hematuria Urology consulted, recommend outpatient follow-up Patient was on Eliquis  2.5 mg twice daily at home Now hematuria has resolved  Other Stroke Risk Factors Advanced age PAD s/p R AKA and L finger  Other Active Problems AKI on CKD, creatinine 1.33--2.65--2.14 Hyponatremia sodium 126--130--132  Hospital day # 2   Ary Cummins, MD PhD Stroke Neurology 09/18/2024 11:21 AM   To contact Stroke Continuity provider, please refer to [email or web address]. After hours, contact General Neurology

## 2024-09-18 NOTE — Plan of Care (Signed)

## 2024-09-18 NOTE — Evaluation (Signed)
 Physical Therapy Evaluation Patient Details Name: [name] MRN: [id number] DOB: [birth year] Today's Date: 09/18/2024  History of Present Illness  Pt is a 73 y/o F admitted on 09/16/24 after presenting with c/o fatigue, dizziness, ongoing hematuria x several weeks. While in the ED pt had stroke like symptoms. Pt found to have Hgb of 4, AKI, & acute CVA (MRI showed Acute punctate diffusion abnormality in the anterior R thalamus & focal restricted diffusion in the L occipital lobe, consistent with focal acute infarcts). PMH: polycythemia vera, CVA with R sided hemiplegia, PAD s/p numerous PCI, R AKA, a-fib on Eliquis , HFpEF, chronic hypoxic respiratory failure on 2L  Clinical Impression  Pt seen for PT evaluation with pt received in bed, reporting nausea but agreeable to session. Pt reports since her CVA earlier this year, she has required assistance for transfers to/from w/c & has not walked since. On this date, pt is able to come to sitting EOB with min assist, HOB elevated, bed rails. Attempted stand pivot bed>recliner but pt not pushing through LLE to come to standing; required max assist to scoot to drop arm recliner. Pt reports she's close to her baseline in regards to functional mobility. Will continue to follow pt acutely to address endurance, balance, strengthening & transfers to reduce fall risk & decrease caregiver burden.        If plan is discharge home, recommend the following: A lot of help with walking and/or transfers;A lot of help with bathing/dressing/bathroom;Assistance with feeding;Assistance with cooking/housework;Help with stairs or ramp for entrance;Assist for transportation   Can travel by private vehicle        Equipment Recommendations None recommended by PT  Recommendations for Other Services  Rehab consult    Functional Status Assessment Patient has had a recent decline in their functional status and demonstrates the ability to make significant improvements in  function in a reasonable and predictable amount of time.     Precautions / Restrictions Precautions Precautions: Fall Precaution/Restrictions Comments: R AKA, R hemi Restrictions Weight Bearing Restrictions Per Provider Order: No      Mobility  Bed Mobility Overal bed mobility: Needs Assistance Bed Mobility: Supine to Sit     Supine to sit: Min assist, Used rails, HOB elevated (exit R side of bed)     General bed mobility comments: mod<>max assist to scoot buttocks to edge of bed with cuing re: lateral leans & scooting    Transfers Overall transfer level: Needs assistance   Transfers: Bed to chair/wheelchair/BSC            Lateral/Scoot Transfers: Max assist General transfer comment: Attempts stand pivot x2 with PT provding cuign re: hand placement with pt stating just put your hand here & pick me up; PT blocking L knee but pt does not attempt to push to standing through LLE, notes it's still weak. PT required max assist for lateral scoot bed>drop arm recliner on R.    Ambulation/Gait                  Stairs            Wheelchair Mobility     Tilt Bed    Modified Rankin (Stroke Patients Only)       Balance Overall balance assessment: Needs assistance Sitting-balance support: Feet supported Sitting balance-Leahy Scale: Fair Sitting balance - Comments: close supervision static sitting  Pertinent Vitals/Pain Pain Assessment Pain Assessment: Faces Faces Pain Scale: Hurts little more Pain Location: L knee during transfer attempt Pain Descriptors / Indicators: Discomfort, Guarding, Grimacing Pain Intervention(s): Repositioned, Monitored during session, Limited activity within patient's tolerance    Home Living Family/patient expects to be discharged to:: Private residence Living Arrangements: Children Available Help at Discharge: Family;Available 24 hours/day (daughter, granddaughter,  granddaughter's boyfriend) Type of Home: House Home Access: Stairs to enter Entrance Stairs-Rails: None Entrance Stairs-Number of Steps: 1   Home Layout: One level Home Equipment: Wheelchair - power;Other (comment);Tub bench (RLE prosthesis)      Prior Function               Mobility Comments: Pt reports since her CVA earlier this year she has not walked, relies on family to transfer her to/from w/c, TTB. Sleeps in a regular bed. Reports daughter bumps her up single step into home in w/c.       Extremity/Trunk Assessment   Upper Extremity Assessment Upper Extremity Assessment: Generalized weakness (hx of R sided weakness following CVA earlier this year)    Lower Extremity Assessment Lower Extremity Assessment: RLE deficits/detail;LLE deficits/detail RLE Deficits / Details: R AKA, hx of R sided weakness following CVA earlier this year LLE Deficits / Details: 3-/5 knee extension, 2/5 hip flexion in sitting       Communication   Communication Communication: No apparent difficulties    Cognition Arousal: Alert Behavior During Therapy: WFL for tasks assessed/performed   PT - Cognitive impairments: Problem solving                         Following commands: Impaired Following commands impaired: Follows one step commands with increased time     Cueing Cueing Techniques: Verbal cues     General Comments General comments (skin integrity, edema, etc.): VSS throughout session    Exercises     Assessment/Plan    PT Assessment Patient needs continued PT services  PT Problem List Decreased strength;Decreased coordination;Decreased activity tolerance;Decreased balance;Decreased mobility;Decreased safety awareness;Decreased knowledge of use of DME;Decreased skin integrity;Decreased range of motion;Decreased cognition       PT Treatment Interventions Balance training;DME instruction;Neuromuscular re-education;Gait training;Functional mobility  training;Patient/family education;Therapeutic activities;Therapeutic exercise;Manual techniques;Wheelchair mobility training    PT Goals (Current goals can be found in the Care Plan section)  Acute Rehab PT Goals Patient Stated Goal: none stated PT Goal Formulation: With patient Time For Goal Achievement: 10/02/24 Potential to Achieve Goals: Good    Frequency Min 2X/week     Co-evaluation               AM-PAC PT 6 Clicks Mobility  Outcome Measure Help needed turning from your back to your side while in a flat bed without using bedrails?: A Little Help needed moving from lying on your back to sitting on the side of a flat bed without using bedrails?: A Lot Help needed moving to and from a bed to a chair (including a wheelchair)?: Total Help needed standing up from a chair using your arms (e.g., wheelchair or bedside chair)?: Total Help needed to walk in hospital room?: Total Help needed climbing 3-5 steps with a railing? : Total 6 Click Score: 9    End of Session   Activity Tolerance: Patient tolerated treatment well;Patient limited by fatigue Patient left: in chair;with chair alarm set;with call bell/phone within reach Nurse Communication: Mobility status (need for assistance for meal tray set up) PT Visit Diagnosis: Muscle weakness (  generalized) (M62.81);Other abnormalities of gait and mobility (R26.89);Unsteadiness on feet (R26.81)    Time: 8551-8497 PT Time Calculation (min) (ACUTE ONLY): 14 min   Charges:   PT Evaluation $PT Eval Moderate Complexity: 1 Mod   PT General Charges $$ ACUTE PT VISIT: 1 Visit         Richerd Pinal, PT, DPT 09/18/24, 3:35 PM   Richerd CHRISTELLA Pinal 09/18/2024, 3:33 PM

## 2024-09-18 NOTE — Progress Notes (Signed)
 SLP Cancellation Note  Patient Details Name: [name] MRN: [id number] DOB: 15-Apr-1951   Cancelled treatment:       Reason Eval/Treat Not Completed: Patient at procedure or test/unavailable (NPO for HIDA scan today). Of note, pt passed the swallow screen so can resume a PO diet when medically able but has a history of oral dysphagia per chart review, requiring diet modification. SLP will f/u for further assessment and evaluate cognitive-linguistic function.    Damien Blumenthal, M.A., CCC-SLP Speech Language Pathology, Acute Rehabilitation Services  Secure Chat preferred [phone]  09/18/2024, 11:02 AM

## 2024-09-18 NOTE — Progress Notes (Signed)
 VASCULAR LAB    Carotid duplex has been performed.  See CV proc for preliminary results.   Roniya Tetro, RVT 09/18/2024, 12:11 PM
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
## 2024-09-18 NOTE — Progress Notes (Signed)
 Hepatobiliary scan negative for cholecystitis, no acute surgical needs.  Call as needed.   Almarie Pringle, PA-C [facility] Please see Amion for pager number during day hours 7:00am-4:30pm

## 2024-09-18 NOTE — Progress Notes (Signed)
 PROGRESS NOTE        PATIENT DETAILS Name: [name] Age: 73 y.o. Sex: female Date of Birth: 10/01/1951 Admit Date: 09/16/2024 Admitting Physician Micaela Speaker, MD ERE:Fpopjw, Marry Lenis, FNP  Brief Summary: Patient is a 73 y.o.  female with history of polycythemia vera (JAK2 positive), CVA with right-sided hemiplegia, PAD-s/p numerous PCI-and right AKA, A-fib on Eliquis , HFpEF, chronic hypoxic respiratory failure on 2 L-who presented with fatigue/dizziness/ongoing hematuria (several weeks).  While in the ED-she had strokelike symptoms.  She was subsequently found to have severe anemia with hemoglobin of 4, AKI, and acute CVA.  Significant events: 9/27>> admit to [facility].  Significant studies: 9/27>> CT head: No acute intracranial abnormality 9/27>> CTA head/neck: No LVO-no significant stenosis in the head/neck.  Severe stenosis of left subclavian artery.  Left vertebral artery occlusion. 9/27>> RUQ ultrasound: Cholelithiasis-with additional findings consistent with sequelae of acute cholecystitis. 9/28>> renal ultrasound: No hydronephrosis. 9/28>> MRI brain: Acute infarct right thalamus/left occipital lobe. 9/28>> A1c: 4.8 9/28>> LDL: Not able to be calculated. 9/28>> acute hepatitis serology: Negative 9/28>> AM cortisol: 48.8 9/28>> stool FOBT x 2: Negative  Significant microbiology data: 9/28>> blood culture: No growth  Procedures: None  Consults: Hematology/oncology Neurology Urology  Subjective: Lying comfortably in bed-denies any chest pain or shortness of breath.  Objective: Vitals: Blood pressure 120/63, pulse 78, temperature 98.2 F (36.8 C), resp. rate 16, height 5' 4 (1.626 m), weight 41.7 kg, SpO2 93%.   Exam: Gen Exam:Alert awake-not in any distress HEENT:atraumatic, normocephalic Chest: B/L clear to auscultation anteriorly CVS:S1S2 regular Abdomen:soft non tender, non distended Extremities: Right AKA- Neurology:  right arm hemiplegia at baseline. Skin: no rash  Pertinent Labs/Radiology:    Latest Ref Rng & Units 09/18/2024    4:55 AM 09/17/2024    5:56 PM 09/17/2024    5:55 PM  CBC  WBC 4.0 - 10.5 K/uL 36.7  33.7  33.9   Hemoglobin 12.0 - 15.0 g/dL 8.1  8.0  8.1   Hematocrit 36.0 - 46.0 % 24.7  24.7  24.7   Platelets 150 - 400 K/uL 521  570  592     Lab Results  Component Value Date   NA 132 (L) 09/18/2024   K 3.8 09/18/2024   CL 101 09/18/2024   CO2 19 (L) 09/18/2024      Assessment/Plan: Hematuria Likely secondary to combination of anticoagulation/bladder mass Hematuria has resolved-clear urine in Foley tubing. Urology recommending outpatient follow-up/workup of bladder mass. Since hematuria is cleared x 2 days-suspect we could rechallenge anticoagulation with IV heparin  and see how she does.  Acute blood loss anemia Presumably secondary to hematuria-as no other source of bleeding apparent-FOBT negative x 2 Hb stable after 2 units of PRBC Iron indices consistent with iron deficiency-Will dose 1 dose of IV iron. Follow CBC.  AKI on CKD stage IIIb AKI unclear etiology-possibly secondary to hypotensive episode that patient had several days back. No hydronephrosis seen on imaging studies Avoid nephrotoxic agents-trend creatinine and see where things are over the next several days. Avoid nephrotoxic agents.  Hypomagnesemia Replete/recheck  Transaminitis ?  Shock liver from hypotension LFTs downtrending-not ordered for this morning RUQ ultrasound with some suspicion for acute cholecystitis but patient without any acute RUQ pain. Await HIDA scan.  Hypotension Probably secondary to blood loss/use of antihypertensives BP now stable after supportive care Not felt  to have adrenal insufficiency as a.m. cortisol was stable. Continue to monitor off antihypertensives  Acute CVA (2 punctate infarct right thalamus/left occipital area) In the setting of anemia/hypotension-advanced  vasculopathy. Echo/carotid Doppler pending Currently not on antiplatelet/anticoagulant secondary to severity of anemia. Await PT/OT/SLP eval Prior right-sided hemiplegia/right AKA Neurology following.  ?  UTI Urine culture negative Stop Rocephin   History of PAF Sinus rhythm Eliquis  held due to sinus rhythm Diltiazem  held due to hypotension on presentation.  QTc prolongation Telemetry monitoring Keep K> 4, Mg> 2  Repeat twelve-lead EKG 9/30 a.m.  History of PAD-numerous PCI's and right AKA  History of polycythemia vera Follows with hematology in the outpatient setting Evaluated by Dr. Feng-heme/onc on 9/28-not felt to have worsening anemia secondary to transformation to myelofibrosis-bone marrow biopsy not felt to be needed. No longer on Hydrea -due to anemia.  Previously used to get therapeutic phlebotomy Continue to follow CBC and follow-up with hematology in the outpatient setting.  HLD Zetia /Tricor  held due to transaminitis  HTN Antihypertensive held due to hypotension.  Chronic leukocytosis/thrombocytosis Follows with hematology/oncology See above  Chronic hypoxic respiratory failure on 2 L of oxygen. History of reactive airway disease Stable As needed bronchodilators.  Generalized anxiety disorder Stable Lexapro   Failure to thrive syndrome/severe protein calorie malnutrition PT/OT/dietitian eval.  Code status:   Code Status: Full Code   DVT Prophylaxis: SCDs Start: 09/16/24 1943   Family Communication: Daughter-Cary [phone] called-left VM 9/29   Disposition Plan: Status is: Inpatient Remains inpatient appropriate because: Severity of illness   Planned Discharge Destination:Home   Diet: Diet Order             Diet NPO time specified  Diet effective midnight                     Antimicrobial agents: Anti-infectives (From admission, onward)    Start     Dose/Rate Route Frequency Ordered Stop   09/17/24 0000  cefTRIAXone   (ROCEPHIN ) 2 g in sodium chloride  0.9 % 100 mL IVPB        2 g 200 mL/hr over 30 Minutes Intravenous Every 24 hours 09/16/24 2355     09/17/24 0000  metroNIDAZOLE (FLAGYL) IVPB 500 mg        500 mg 100 mL/hr over 60 Minutes Intravenous Every 12 hours 09/16/24 2355 09/23/24 2159        MEDICATIONS: Scheduled Meds:  Chlorhexidine  Gluconate Cloth  6 each Topical Daily   escitalopram   20 mg Oral Daily   feeding supplement  237 mL Oral TID BM   sodium chloride  flush  3 mL Intravenous Q12H   sodium chloride  flush  3 mL Intravenous Q12H   Continuous Infusions:  cefTRIAXone  (ROCEPHIN )  IV 2 g (09/17/24 2358)   famotidine (PEPCID) IV 20 mg (09/17/24 2039)   magnesium  sulfate bolus IVPB 4 g (09/18/24 0804)   metronidazole 500 mg (09/17/24 2139)   PRN Meds:.levalbuterol, methocarbamol, sodium chloride  flush, trimethobenzamide   I have personally reviewed following labs and imaging studies  LABORATORY DATA: CBC: Recent Labs  Lab 09/17/24 0449 09/17/24 0800 09/17/24 1755 09/17/24 1756 09/18/24 0455  WBC 34.9* 27.7* 33.9* 33.7* 36.7*  NEUTROABS  --   --   --  31.7*  --   HGB 7.6* 7.5* 8.1* 8.0* 8.1*  HCT 24.4* 23.4* 24.7* 24.7* 24.7*  MCV 74.2* 74.1* 72.6* 72.2* 73.1*  PLT 640* 531* 592* 570* 521*    Basic Metabolic Panel: Recent Labs  Lab 09/16/24 1639 09/16/24 1800  09/17/24 0449 09/17/24 0800 09/17/24 1755 09/18/24 0455  NA 126*  --  130* 130* 130* 132*  K 5.8*  --  4.7 4.4 3.9 3.8  CL 100  --  104 102 98 101  CO2 11*  --  14* 17* 19* 19*  GLUCOSE 90  --  141* 159* 125* 120*  BUN 76*  --  66* 65* 62* 62*  CREATININE 2.65*  --  2.30* 2.22* 2.07* 2.14*  CALCIUM  7.5*  --  6.8* 6.7* 6.7* 6.7*  MG  --  1.5*  --   --   --  1.4*    GFR: Estimated Creatinine Clearance: 15.4 mL/min (A) (by C-G formula based on SCr of 2.14 mg/dL (H)).  Liver Function Tests: Recent Labs  Lab 09/16/24 1639 09/17/24 0449 09/17/24 0800 09/17/24 1755  AST 265* 153* 128* 115*  ALT  164* 134* 126* 127*  ALKPHOS 82 72 64 68  BILITOT 0.8 0.7 0.8 0.9  PROT 5.1* 4.6* 4.4* 4.7*  ALBUMIN  2.5* 2.2* 2.1* 2.3*   No results for input(s): LIPASE, AMYLASE in the last 168 hours. No results for input(s): AMMONIA in the last 168 hours.  Coagulation Profile: Recent Labs  Lab 09/16/24 1800  INR 2.2*    Cardiac Enzymes: No results for input(s): CKTOTAL, CKMB, CKMBINDEX, TROPONINI in the last 168 hours.  BNP (last 3 results) No results for input(s): PROBNP in the last 8760 hours.  Lipid Profile: Recent Labs    09/17/24 0449  CHOL 86  HDL <10*  LDLCALC NOT CALCULATED  TRIG 204*  CHOLHDL NOT CALCULATED    Thyroid  Function Tests: Recent Labs    09/17/24 0449  TSH 1.742  FREET4 0.84    Anemia Panel: Recent Labs    09/17/24 1755  VITAMINB12 889  FOLATE 12.3  FERRITIN 20  TIBC 430  IRON <10*  RETICCTPCT 1.4    Urine analysis:    Component Value Date/Time   COLORURINE YELLOW 09/17/2024 0255   APPEARANCEUR CLOUDY (A) 09/17/2024 0255   LABSPEC 1.020 09/17/2024 0255   PHURINE 5.0 09/17/2024 0255   GLUCOSEU NEGATIVE 09/17/2024 0255   HGBUR MODERATE (A) 09/17/2024 0255   BILIRUBINUR NEGATIVE 09/17/2024 0255   KETONESUR NEGATIVE 09/17/2024 0255   PROTEINUR 100 (A) 09/17/2024 0255   NITRITE NEGATIVE 09/17/2024 0255   LEUKOCYTESUR LARGE (A) 09/17/2024 0255    Sepsis Labs: Lactic Acid, Venous    Component Value Date/Time   LATICACIDVEN 0.7 09/01/2024 0741    MICROBIOLOGY: Recent Results (from the past 240 hours)  Culture, blood (Routine X 2) w Reflex to ID Panel     Status: None (Preliminary result)   Collection Time: 09/17/24  4:58 AM   Specimen: BLOOD LEFT ARM  Result Value Ref Range Status   Specimen Description BLOOD LEFT ARM  Final   Special Requests   Final    BOTTLES DRAWN AEROBIC AND ANAEROBIC Blood Culture adequate volume   Culture   Final    NO GROWTH < 12 HOURS Performed at [facility], [address]  [place], [place] [address]    Report Status PENDING  Incomplete  Culture, blood (Routine X 2) w Reflex to ID Panel     Status: None (Preliminary result)   Collection Time: 09/17/24  5:02 AM   Specimen: BLOOD LEFT HAND  Result Value Ref Range Status   Specimen Description BLOOD LEFT HAND  Final   Special Requests   Final    BOTTLES DRAWN AEROBIC ONLY Blood Culture  adequate volume   Culture   Final    NO GROWTH < 12 HOURS Performed at [facility], [address], [place], [place] [address]    Report Status PENDING  Incomplete    RADIOLOGY STUDIES/RESULTS: MR BRAIN WO CONTRAST Result Date: 09/17/2024 EXAM: MRI BRAIN WITHOUT CONTRAST 09/17/2024 03:19:33 PM TECHNIQUE: Multiplanar multisequence MRI of the head/brain was performed without the administration of intravenous contrast. COMPARISON: None available. CLINICAL HISTORY: Concern for acute ischemic stroke. Tia with aphasia and weakness. Anemia. FINDINGS: BRAIN AND VENTRICLES: Punctate focus of diffusion abnormality is present in the anterior right thalamus. Focal restricted diffusion is present in the left occipital lobe. No intracranial hemorrhage. No mass. No midline shift. No hydrocephalus. Confluent Periventricular and scattered subcortical T2 hyperintensities bilaterally are moderately advanced for age. A remote lacunar infarct is present in the anterior right lentiform nucleus. A remote left paramedian pontine infarct demonstrates expected interval change. The sella is unremarkable. Normal flow voids. ORBITS: No acute abnormality. SINUSES AND MASTOIDS: No acute abnormality. BONES AND SOFT TISSUES: Normal marrow signal. No acute soft tissue abnormality. IMPRESSION: 1. Acute punctate diffusion abnormality in the anterior right thalamus and focal restricted diffusion in the left occipital lobe, consistent with focal acute infarcts. 2. Moderately advanced confluent periventricular and scattered subcortical T2 hyperintensities bilaterally. 3.  Remote lacunar infarct in the anterior right lentiform nucleus. 4. Remote left paramedian pontine infarct with expected interval change. Electronically signed by: Lonni Necessary MD 09/17/2024 03:58 PM EDT RP Workstation: [id number]   US  RENAL Result Date: 09/17/2024 CLINICAL DATA:  [id number] AKI (acute kidney injury) [id number] EXAM: RENAL / URINARY TRACT ULTRASOUND COMPLETE COMPARISON:  September 16, 2024 FINDINGS: Right Kidney: Renal measurements: 8.7 x 4.6 x 4.9 cm = volume: 101 mL. Echogenicity is mildly increased. No mass or hydronephrosis visualized. Left Kidney: Renal measurements: 10.3 x 3.6 x 4.8 cm = volume: 93 mL. Echogenicity is mildly increased. No mass or hydronephrosis visualized. Bladder: Decompressed around a Foley catheter. Other: Cholelithiasis.  Splenomegaly.  Trace ascites. IMPRESSION: 1. No hydronephrosis. 2. Mildly increased renal echogenicity as can be seen in medical renal disease. 3. Splenomegaly. 4. Cholelithiasis. Electronically Signed   By: Corean Salter M.D.   On: 09/17/2024 12:57   US  Abdomen Limited RUQ (LIVER/GB) Result Date: 09/16/2024 CLINICAL DATA:  Transaminitis. EXAM: ULTRASOUND ABDOMEN LIMITED RIGHT UPPER QUADRANT COMPARISON:  None Available. FINDINGS: Gallbladder: Multiple shadowing, echogenic, mobile gallstones are seen within the gallbladder lumen. The largest measures approximately 1.3 cm. Gallbladder wall thickening is seen (7.0 mm). Pericholecystic fluid is also present. No sonographic Murphy sign noted by sonographer. Common bile duct: Diameter: 5.1 mm Liver: No focal lesion identified. Within normal limits in parenchymal echogenicity. Portal vein is patent on color Doppler imaging with normal direction of blood flow towards the liver. Other: Of incidental note is the presence of a right sided pleural effusion. IMPRESSION: 1. Cholelithiasis with additional findings consistent with sequelae associated with acute cholecystitis. 2. Right pleural effusion.  Electronically Signed   By: Suzen Dials M.D.   On: 09/16/2024 23:35   CT ANGIO HEAD NECK W WO CM (CODE STROKE) Result Date: 09/16/2024 EXAM: CTA HEAD AND NECK WITH AND WITHOUT 09/16/2024 06:24:15 PM TECHNIQUE: CTA of the head and neck was performed with and without the administration of 50 mL of intravenous contrast (iohexol  (OMNIPAQUE ) 350 MG/ML injection 75 mL IOHEXOL  350 MG/ML SOLN). Multiplanar 2D and/or 3D reformatted images are provided for review. Automated exposure control, iterative reconstruction, and/or weight based adjustment of the  mA/kV was utilized to reduce the radiation dose to as low as reasonably achievable. Stenosis of the internal carotid arteries measured using NASCET criteria. COMPARISON: CT angio head and neck 02/09/2024 at [facility] and CTA head and neck 03/04/2021 at [facility]. CLINICAL HISTORY: Neuro deficit, acute, stroke suspected. FINDINGS: CTA NECK: AORTIC ARCH AND ARCH VESSELS: Extensive atherosclerotic changes are again noted at the aortic arch and great vessel origins. Severe stenosis of the left subclavian artery proximal to the vertebral artery has again noted. No dissection or arterial injury. No significant stenosis of the brachiocephalic artery. CERVICAL CAROTID ARTERIES: Atherosclerotic calcifications are present throughout the right common carotid artery without a significant stenosis relative to the more distal vessel. Atherosclerotic calcifications are present in the proximal right ICA without significant stenosis. Diffuse mural calcification are present throughout the left common carotid artery without significant stenosis. Patient motion is present through the area of stenosis previously noted in the proximal left ICA. The stenosis is not significantly changed. No dissection or arterial injury. CERVICAL VERTEBRAL ARTERIES: The left vertebral artery is occluded in the V1 and proximal left V2 segments. It is partially reconstituted in the neck. The  right vertebral artery shows no dissection, arterial injury, or significant stenosis in its cervical segments. LUNGS AND MEDIASTINUM: Unremarkable. SOFT TISSUES: No acute abnormality. BONES: Multilevel degenerative changes are again noted in the cervical spine. CTA HEAD: ANTERIOR CIRCULATION: Atherosclerotic calcifications are present within the cavernous internal carotid arteries bilaterally without significant stenoses through the ICA terminae. New left A1 segment is hypoplastic. No significant stenosis of the middle cerebral arteries. No aneurysm. POSTERIOR CIRCULATION: A fetal-type right cerebral artery is again noted. Moderate narrowing is present in the left P1 segment. Stable mild narrowing in the mid basilar artery is stable. Moderate narrowing is present in the distal vertebral arteries bilaterally. No aneurysm. OTHER: ECA branch vessels are opacified bilaterally. No dural venous sinus thrombosis on this non-dedicated study. IMPRESSION: 1. No large vessel occlusion or aneurysm in the head or neck. 2. 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 3. 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 4. Moderate narrowing in the left P1 segment, distal vertebral arteries bilaterally, and stable mild narrowing in the mid basilar artery. 5. Extensive atherosclerotic changes as described. Findings were communicated to doctor Michaela via the amion system at 06:48 pm Electronically signed by: Lonni Necessary MD 09/16/2024 07:00 PM EDT RP Workstation: [id number]   CT HEAD CODE STROKE WO CONTRAST Result Date: 09/16/2024 EXAM: CT HEAD WITHOUT CONTRAST 09/16/2024 06:12:59 PM TECHNIQUE: CT of the head was performed without the administration of intravenous contrast. Automated exposure control, iterative reconstruction, and/or weight based adjustment of the mA/kV was utilized to reduce the radiation dose to  as low as reasonably achievable. The study is mildly degraded by patient motion. COMPARISON: CT head without contrast 01/11/2024. MR head without contrast 03/09/2024 at [facility]. CLINICAL HISTORY: Neuro deficit, acute, stroke suspected. FINDINGS: BRAIN AND VENTRICLES: No acute hemorrhage. No evidence of acute infarct. No hydrocephalus. No extra-axial collection. No mass effect or midline shift. Remote lacunar infarcts are again noted within the right caudate head and internal capsule. Confluent Periventricular and scattered subcortical white matter hypoattenuation is moderately advanced for age. This most likely reflects the sequelae of chronic microvascular ischemia. Atherosclerotic calcifications are present in the cavernous carotid arteries bilaterally and at the dural margin of both vertebral arteries. No hyperdense vessel  is present. ORBITS: No acute abnormality. SINUSES: No acute abnormality. SOFT TISSUES AND SKULL: No acute soft tissue abnormality. No skull fracture. [place] stroke program early CT (aspect) score: Ganglionic (caudate, ic, Lentiform Nucleus, insula, M1-m3): 7 Supraganglionic (m4-m6): 3 Total: 10 IMPRESSION: 1. No acute intracranial abnormality.  ASPECTS 10/10 2. Remote lacunar infarcts in the right caudate head and internal capsule. 3. Moderately advanced confluent periventricular and scattered subcortical white matter hypoattenuation, likely sequelae of chronic microvascular ischemia. 4. Atherosclerotic calcifications in the cavernous carotid arteries bilaterally and at the dural margin of both vertebral arteries. No hyperdense vessel is present. The pertinent results were texted to Dr. Michaela via the [facility] system at 6:21 pm. Electronically signed by: Lonni Necessary MD 09/16/2024 06:23 PM EDT RP Workstation: [id number]     LOS: 2 days   Donalda Applebaum, MD  [facility]    To contact the attending provider between 7A-7P or the covering provider during after  hours 7P-7A, please log into the web site [email or web address] and access using universal [place] password for that web site. If you do not have the password, please call the hospital operator.  09/18/2024, 8:06 AM

## 2024-09-18 NOTE — Progress Notes (Signed)
 Echocardiogram 2D Echocardiogram has been performed.  Damien FALCON Horace Lukas RDCS 09/18/2024, 11:34 AM

## 2024-09-18 NOTE — Evaluation (Signed)
 Occupational Therapy Evaluation and Discharge Patient Details Name: [name] MRN: [id number] DOB: [birth year] Today's Date: 09/18/2024   History of Present Illness   Pt is a 73 y/o F admitted on 09/16/24 after presenting with c/o fatigue, dizziness, ongoing hematuria x several weeks. While in the ED pt had stroke like symptoms. Pt found to have Hgb of 4, AKI, & acute CVA (MRI showed Acute punctate diffusion abnormality in the anterior R thalamus & focal restricted diffusion in the L occipital lobe, consistent with focal acute infarcts). PMH: polycythemia vera, CVA with R sided hemiplegia, PAD s/p numerous PCI, R AKA, a-fib on Eliquis , HFpEF, chronic hypoxic respiratory failure on 2L     Clinical Impressions Pt lives with her daughter, daughter's boyfriend and granddaughter. She has 24 hour care. Her daughter assists with transfers, bathing and dressing. Pt uses a brief for toileting. Pt is able to self feed and groom with L hand with set up to open containers. Pt requires max assist for lateral transfers. Pt reports she at or near her baseline. No acute OT needs. Recommend ADLs with nursing staff.      If plan is discharge home, recommend the following:   A lot of help with walking and/or transfers;A lot of help with bathing/dressing/bathroom;Assistance with cooking/housework;Assistance with feeding;Direct supervision/assist for medications management;Direct supervision/assist for financial management;Assist for transportation;Help with stairs or ramp for entrance     Functional Status Assessment   Patient has not had a recent decline in their functional status     Equipment Recommendations   None recommended by OT     Recommendations for Other Services         Precautions/Restrictions   Precautions Precautions: Fall Recall of Precautions/Restrictions: Intact Precaution/Restrictions Comments: R AKA, R hemi Restrictions Weight Bearing Restrictions Per Provider Order:  No     Mobility Bed Mobility                    Transfers Overall transfer level: Needs assistance   Transfers: Bed to chair/wheelchair/BSC            Lateral/Scoot Transfers: Max assist General transfer comment: unable to stand      Balance Overall balance assessment: Needs assistance   Sitting balance-Leahy Scale: Fair Sitting balance - Comments: close supervision static sitting     Standing balance-Leahy Scale: Zero                             ADL either performed or assessed with clinical judgement   ADL Overall ADL's : At baseline                                             Vision Ability to See in Adequate Light: 0 Adequate Patient Visual Report: No change from baseline       Perception         Praxis         Pertinent Vitals/Pain Pain Assessment Pain Assessment: No/denies pain     Extremity/Trunk Assessment Upper Extremity Assessment Upper Extremity Assessment: Right hand dominant;RUE deficits/detail RUE Deficits / Details: hemiplegia since CVA in January RUE Coordination: decreased fine motor;decreased gross motor   Lower Extremity Assessment Lower Extremity Assessment: Defer to PT evaluation RLE Deficits / Details: R AKA, hx of R sided weakness following CVA earlier this year LLE  Deficits / Details: 3-/5 knee extension, 2/5 hip flexion in sitting       Communication Communication Communication: No apparent difficulties   Cognition Arousal: Alert Behavior During Therapy: WFL for tasks assessed/performed Cognition: No family/caregiver present to determine baseline             OT - Cognition Comments: appears to be an accurate historian                 Following commands: Impaired Following commands impaired: Follows one step commands with increased time     Cueing  General Comments   Cueing Techniques: Verbal cues  VSS throughout session   Exercises     Shoulder  Instructions      Home Living Family/patient expects to be discharged to:: Private residence Living Arrangements: Children Available Help at Discharge: Family;Available 24 hours/day (daughter, daughters boyfriend, granddaughter) Type of Home: House Home Access: Stairs to enter Secretary/administrator of Steps: 1 Entrance Stairs-Rails: None Home Layout: One level     Bathroom Shower/Tub: Dietitian: Wheelchair - power;Other (comment);Tub bench (R LE prosthesis)          Prior Functioning/Environment Prior Level of Function : Needs assist             Mobility Comments: Pt reports since her CVA earlier this year she has not walked, relies on family to transfer her to/from w/c, TTB. Sleeps in a regular bed. Reports daughter bumps her up single step into home in w/c. ADLs Comments: gets in shower on bench sometimes, assisted for bathing and dressing, wears a brief for toileting    OT Problem List: Decreased strength;Impaired UE functional use   OT Treatment/Interventions:        OT Goals(Current goals can be found in the care plan section)       OT Frequency:       Co-evaluation              AM-PAC OT 6 Clicks Daily Activity     Outcome Measure Help from another person eating meals?: A Little Help from another person taking care of personal grooming?: A Little Help from another person toileting, which includes using toliet, bedpan, or urinal?: Total Help from another person bathing (including washing, rinsing, drying)?: Total Help from another person to put on and taking off regular upper body clothing?: A Lot Help from another person to put on and taking off regular lower body clothing?: Total 6 Click Score: 11   End of Session Equipment Utilized During Treatment: Gait belt  Activity Tolerance: Patient tolerated treatment well Patient left: in chair;with call bell/phone within reach;with chair alarm set  OT Visit  Diagnosis: Hemiplegia and hemiparesis;Muscle weakness (generalized) (M62.81) Hemiplegia - Right/Left: Right Hemiplegia - dominant/non-dominant: Dominant Hemiplegia - caused by: Cerebral infarction                Time: 1550-1610 OT Time Calculation (min): 20 min Charges:  OT General Charges $OT Visit: 1 Visit OT Evaluation $OT Eval Moderate Complexity: 1 Mod  Mliss HERO, OTR/L Acute Rehabilitation Services Office: [phone]   Kennth Mliss Helling 09/18/2024, 4:18 PM

## 2024-09-18 NOTE — Progress Notes (Signed)
 PHARMACY - ANTICOAGULATION CONSULT NOTE  Pharmacy Consult:  Heparin  Indication: atrial fibrillation  Allergies  Allergen Reactions   Atenolol Rash and Itching   Demerol   [Meperidine  Hcl] Itching   Demerol  [Meperidine ] Itching and Other (See Comments)    Unknown, can't remember    Gabapentin  Anxiety and Other (See Comments)    SI and HI   Inderal [Propranolol] Other (See Comments) and Itching    Doesn't remember    Prednisone Other (See Comments)    Muscle spasms   Statins Itching and Other (See Comments)    Simvastatin  - caused severe itching Pravastatin  - caused lesser tiching One type of statin? Caused stroke in 2006, and other symptoms as result   Warfarin And Related Other (See Comments)    Caused nose bleeds   Crestor [Rosuvastatin] Itching and Rash   Docosahexaenoic Acid (Dha) (Fish)     nosebleeds   Lactose Intolerance (Gi) Other (See Comments)    Bloating, gas   Lipitor [Atorvastatin ] Rash   Lovaza [Omega-3-Acid Ethyl Esters (Fish)] Other (See Comments)    nosebleeds    Penicillins Hives, Rash and Other (See Comments)    Immediate rash, facial/tongue/throat swelling, SOB or lightheadedness with hypotension Questionable high fever    Pravastatin  Itching and Rash   Simvastatin  Itching, Rash and Other (See Comments)   Trilipix [Choline Fenofibrate ] Other (See Comments)    Doesn't remember    Warfarin Other (See Comments)    nosebleeds   Hydralazine  Swelling   Effexor [Venlafaxine Hcl] Other (See Comments)    Doesn't remember    Nexium [Esomeprazole Magnesium ] Rash   Percocet [Oxycodone -Acetaminophen ] Other (See Comments)    Doesn't remember    Plavix  [Clopidogrel  Bisulfate] Rash    Patient Measurements: Height: 5' 4 (162.6 cm) Weight: 41.7 kg (91 lb 14.9 oz) IBW/kg (Calculated) : 54.7 HEPARIN  DW (KG): 41.7  Vital Signs: Temp: 98 F (36.7 C) (09/29 1624) Temp Source: Oral (09/29 1624) BP: 120/57 (09/29 1624) Pulse Rate: 75 (09/29  1624)  Labs: Recent Labs    09/16/24 1800 09/17/24 0449 09/17/24 0800 09/17/24 1755 09/17/24 1756 09/18/24 0455  HGB  --    < > 7.5* 8.1* 8.0* 8.1*  HCT  --    < > 23.4* 24.7* 24.7* 24.7*  PLT  --    < > 531* 592* 570* 521*  APTT 48*  --   --   --   --   --   LABPROT 25.6*  --   --   --   --   --   INR 2.2*  --   --   --   --   --   CREATININE  --    < > 2.22* 2.07*  --  2.14*   < > = values in this interval not displayed.    Estimated Creatinine Clearance: 15.4 mL/min (A) (by C-G formula based on SCr of 2.14 mg/dL (H)).   Medical History: Past Medical History:  Diagnosis Date   Acute blood loss anemia 05/04/2020   Acute ischemic stroke (HCC)    Acute respiratory failure with hypoxia (HCC) 01/29/2024   AKI (acute kidney injury)    Allergy ?, Don't remember   Arthritis    fingers, some in my knees (01/28/2016)   Atrial fibrillation with RVR, hx of PAF previously 04/27/2013   Paroxysmal atrial fibrillation     Formatting of this note might be different from the original.  Overview:   Paroxysmal atrial fibrillation     Last  Assessment & Plan:   History of PAF maintaining sinus rhythm on flecainide  and Eliquis  oral anti-coagulation     Blood transfusion without reported diagnosis    During surgery   CAD (coronary artery disease), per cath 04/27/13, non obstructive disease, EF 65-70% 05/11/2013   Carotid disease, bilateral 09/20/2013   Lt carotid 50-69% stentosis, less on rt   Cataract    Don't remember   Cellulitis of left leg 01/16/2022   Closed fracture of left tibial plateau 10/23/2018   Depression Don't remember   Dysrhythmia    Elevated troponin 05/04/2020   Encounter for support and coordination of transition of care 11/11/2021   Fatty liver    Fracture of ankle 10/23/2018   GERD (gastroesophageal reflux disease)    Glaucoma    H/O cardiovascular stress test 12/27/2009   negative for ischemia   Headache    occasional since eye pressure regulated (01/28/2016)    Hospital discharge follow-up 10/15/2021   Hyperlipidemia    Hypertension    Hypertensive crisis 01/08/2016   Formatting of this note might be different from the original.  hypertension     Last Assessment & Plan:   BP at goal and significantly improved from previous visit. Patient tolerating valsartan  and chlorthalidone  very well. Reports 1 episode of symptomatic hypotension that resolved after few hours. Denies falls or increase fatigue. Will continue current therapy without changes and follow up as nee   Hypertensive emergency 11/28/2022   Leukocytosis 07/15/2015   Migraine    stopped w/laser holes to relieve pressure in my eyes; had them 3-4 times/wk before taht (01/28/2016)   Mitral regurgitation,2+ by cath 05/11/2013   NSTEMI (non-ST elevated myocardial infarction) (HCC) 04/27/2013   Osteoporosis IDK   Pain    BOTH KNEES - PT HAS TORN MENISCUS LEFT KNEE   Pain in both wrists 09/03/2021   Pain in left knee 10/03/2018   Paroxysmal atrial fibrillation (HCC)    hx of a fib on ASA  AND ELIQUIS     Pelvic fracture (HCC) 09/09/2018   Polycythemia vera (HCC) 08/20/2015   JAK-2 positive 07/19/15   Prosthesis adjustment 01/13/2019   PVD (peripheral vascular disease) with claudication 07/21/2006   previous L ext iliac stent and rt SFA stent, known occluded Lt SFA   S/P cardiac cath 04/27/2013   NON OBSTRUCTIVE DISEASE, 2+ mr, ef 65-70%   Sacral fracture, closed (HCC) 05/04/2020   Statin intolerance    Stroke (HCC) 12/21/2004   SWELLING ALL OVER AND RT SIDE OF FACE DRAWN AND SPEECH SLURRED AND NUMBNESS ON RIGHT SIDE-- ALL RESOLVED   TIA (transient ischemic attack)    Trauma 05/04/2020    Assessment: 24 YOF with history of Afib on Eliquis  PTA.  Patient admitted with ABLA thought secondary to hematuria and new infarcts, so Eliquis  was held.  Pharmacy consulted to start IV heparin .  CBC stable.  Goal of Therapy:  Heparin  level 0.3-0.5 units/ml Monitor platelets by anticoagulation protocol:  Yes   Plan:  No bolus - start IV heparin  at 500 units/hr Check 8 hr heparin  level Order additional heparin  level as needed  Monitor for bleeding and if negative x 24 hrs, then can transition back to Eliquis  per heparin  consult  Caden Fukushima D. Lendell, PharmD, BCPS, BCCCP 09/18/2024, 4:52 PM

## 2024-09-19 ENCOUNTER — Telehealth: Payer: Self-pay

## 2024-09-19 DIAGNOSIS — I6350 Cerebral infarction due to unspecified occlusion or stenosis of unspecified cerebral artery: Secondary | ICD-10-CM | POA: Diagnosis not present

## 2024-09-19 DIAGNOSIS — D649 Anemia, unspecified: Secondary | ICD-10-CM | POA: Diagnosis not present

## 2024-09-19 DIAGNOSIS — R29704 NIHSS score 4: Secondary | ICD-10-CM | POA: Diagnosis not present

## 2024-09-19 DIAGNOSIS — N029 Recurrent and persistent hematuria with unspecified morphologic changes: Secondary | ICD-10-CM | POA: Diagnosis not present

## 2024-09-19 DIAGNOSIS — D72829 Elevated white blood cell count, unspecified: Secondary | ICD-10-CM | POA: Diagnosis not present

## 2024-09-19 DIAGNOSIS — I6389 Other cerebral infarction: Secondary | ICD-10-CM | POA: Diagnosis not present

## 2024-09-19 DIAGNOSIS — N3289 Other specified disorders of bladder: Secondary | ICD-10-CM | POA: Diagnosis not present

## 2024-09-19 LAB — HEPARIN LEVEL (UNFRACTIONATED): Heparin Unfractionated: 0.32 [IU]/mL (ref 0.30–0.70)

## 2024-09-19 LAB — CBC
HCT: 25.7 % — ABNORMAL LOW (ref 36.0–46.0)
Hematocrit: 17.3 % — CL (ref 34.0–46.6)
Hemoglobin: 4.7 g/dL — CL (ref 11.1–15.9)
Hemoglobin: 7.9 g/dL — ABNORMAL LOW (ref 12.0–15.0)
MCH: 19.5 pg — ABNORMAL LOW (ref 26.6–33.0)
MCH: 23 pg — ABNORMAL LOW (ref 26.0–34.0)
MCHC: 27.2 g/dL — ABNORMAL LOW (ref 31.5–35.7)
MCHC: 30.7 g/dL (ref 30.0–36.0)
MCV: 72 fL — ABNORMAL LOW (ref 79–97)
MCV: 74.7 fL — ABNORMAL LOW (ref 80.0–100.0)
NRBC: 1 % — ABNORMAL HIGH (ref 0–0)
Platelets: 973 x10E3/uL (ref 150–450)
Platelets: 485 K/uL — ABNORMAL HIGH (ref 150–400)
RBC: 2.41 x10E6/uL — CL (ref 3.77–5.28)
RBC: 3.44 MIL/uL — ABNORMAL LOW (ref 3.87–5.11)
RDW: 23.7 % — ABNORMAL HIGH (ref 11.7–15.4)
RDW: 24 % — ABNORMAL HIGH (ref 11.5–15.5)
WBC: 53.7 x10E3/uL (ref 3.4–10.8)
WBC: 32 K/uL — ABNORMAL HIGH (ref 4.0–10.5)
nRBC: 0.8 % — ABNORMAL HIGH (ref 0.0–0.2)

## 2024-09-19 LAB — GLUCOSE, CAPILLARY
Glucose-Capillary: 85 mg/dL (ref 70–99)
Glucose-Capillary: 88 mg/dL (ref 70–99)
Glucose-Capillary: 95 mg/dL (ref 70–99)
Glucose-Capillary: 124 mg/dL — ABNORMAL HIGH (ref 70–99)

## 2024-09-19 LAB — COMPREHENSIVE METABOLIC PANEL WITH GFR
ALT: 116 U/L — ABNORMAL HIGH (ref 0–44)
AST: 89 U/L — ABNORMAL HIGH (ref 15–41)
Albumin: 2.1 g/dL — ABNORMAL LOW (ref 3.5–5.0)
Alkaline Phosphatase: 58 U/L (ref 38–126)
Anion gap: 9 (ref 5–15)
BUN: 56 mg/dL — ABNORMAL HIGH (ref 8–23)
CO2: 19 mmol/L — ABNORMAL LOW (ref 22–32)
Calcium: 7 mg/dL — ABNORMAL LOW (ref 8.9–10.3)
Chloride: 101 mmol/L (ref 98–111)
Creatinine, Ser: 1.79 mg/dL — ABNORMAL HIGH (ref 0.44–1.00)
GFR, Estimated: 30 mL/min — ABNORMAL LOW (ref >60)
Glucose, Bld: 82 mg/dL (ref 70–99)
Potassium: 4.4 mmol/L (ref 3.5–5.1)
Sodium: 129 mmol/L — ABNORMAL LOW (ref 135–145)
Total Bilirubin: 0.6 mg/dL (ref 0.0–1.2)
Total Protein: 4.3 g/dL — ABNORMAL LOW (ref 6.5–8.1)

## 2024-09-19 LAB — MAGNESIUM: Magnesium: 2.7 mg/dL — ABNORMAL HIGH (ref 1.7–2.4)

## 2024-09-19 LAB — LDL CHOLESTEROL, DIRECT: Direct LDL: 21 mg/dL (ref 0–99)

## 2024-09-19 LAB — APTT: aPTT: 36 s (ref 24–36)

## 2024-09-19 LAB — HAPTOGLOBIN: Haptoglobin: 98 mg/dL (ref 42–346)

## 2024-09-19 MED ORDER — SODIUM CHLORIDE 0.9 % IV SOLN
200.0000 mg | Freq: Once | INTRAVENOUS | Status: AC
  Administered 2024-09-19: 200 mg via INTRAVENOUS
  Filled 2024-09-19: qty 10

## 2024-09-19 MED ORDER — POLYETHYLENE GLYCOL 3350 17 G PO PACK
17.0000 g | PACK | Freq: Every day | ORAL | Status: DC
  Administered 2024-09-19: 17 g via ORAL
  Filled 2024-09-19: qty 1

## 2024-09-19 MED ORDER — IRON SUCROSE 200 MG IVPB - SIMPLE MED
200.0000 mg | Freq: Once | Status: DC
  Filled 2024-09-19: qty 110

## 2024-09-19 MED ORDER — APIXABAN 2.5 MG PO TABS
2.5000 mg | ORAL_TABLET | Freq: Two times a day (BID) | ORAL | Status: DC
  Administered 2024-09-19 – 2024-09-20 (×3): 2.5 mg via ORAL
  Filled 2024-09-19 (×3): qty 1

## 2024-09-19 NOTE — Progress Notes (Signed)
 PROGRESS NOTE        PATIENT DETAILS Name: [name] Age: 73 y.o. Sex: female Date of Birth: Feb 03, 1951 Admit Date: 09/16/2024 Admitting Physician Micaela Speaker, MD ERE:Izuupwhzm, Fonda LABOR, MD  Brief Summary: Patient is a 73 y.o.  female with history of polycythemia vera (JAK2 positive), CVA with right-sided hemiplegia, PAD-s/p numerous PCI-and right AKA, A-fib on Eliquis , HFpEF, chronic hypoxic respiratory failure on 2 L-who presented with fatigue/dizziness/ongoing hematuria (several weeks).  While in the ED-she had strokelike symptoms.  She was subsequently found to have severe anemia with hemoglobin of 4, AKI, and acute CVA.  Significant events: 9/27>> admit to [facility].  Significant studies: 9/27>> CT head: No acute intracranial abnormality 9/27>> CTA head/neck: No LVO-no significant stenosis in the head/neck.  Severe stenosis of left subclavian artery.  Left vertebral artery occlusion. 9/27>> RUQ ultrasound: Cholelithiasis-with additional findings consistent with sequelae of acute cholecystitis. 9/28>> renal ultrasound: No hydronephrosis. 9/28>> MRI brain: Acute infarct right thalamus/left occipital lobe. 9/28>> A1c: 4.8 9/28>> LDL: Not able to be calculated. 9/28>> acute hepatitis serology: Negative 9/28>> AM cortisol: 48.8 9/28>> stool FOBT x 2: Negative 9/29>> HIDA scan: Negative 9/29>> TTE: EF 45-50% 9/29>> carotid Doppler: 60-79%, right side, 80-99% on left side.  Significant microbiology data: 9/28>> blood culture: No growth  Procedures: None  Consults: Hematology/oncology Neurology Urology  Subjective: No complaints-lying comfortably in bed-clear urine in Foley tubing..  Objective: Vitals: Blood pressure 127/64, pulse 83, temperature 98.7 F (37.1 C), temperature source Oral, resp. rate 16, height 5' 4 (1.626 m), weight 41.7 kg, SpO2 93%.   Exam: Awake/alert Atraumatic/normocephalic Chest: Clear to auscultation CVS: S1-S2  regular:  Abdomen: Soft nontender nondistended Extremities: Right AKA Neurology: Right-sided hemiplegia  Pertinent Labs/Radiology:    Latest Ref Rng & Units 09/19/2024    2:40 AM 09/18/2024    4:55 AM 09/17/2024    5:56 PM  CBC  WBC 4.0 - 10.5 K/uL 32.0  36.7  33.7   Hemoglobin 12.0 - 15.0 g/dL 7.9  8.1  8.0   Hematocrit 36.0 - 46.0 % 25.7  24.7  24.7   Platelets 150 - 400 K/uL 485  521  570     Lab Results  Component Value Date   NA 129 (L) 09/19/2024   K 4.4 09/19/2024   CL 101 09/19/2024   CO2 19 (L) 09/19/2024      Assessment/Plan: Hematuria Likely secondary to combination of anticoagulation/bladder mass Hematuria has resolved-challenged with IV heparin  on 9/29-remains without hematuria. Discussed with urology on 9/29-Dr. Refugia to remove Foley. Urology recommending outpatient follow-up/workup of bladder mass.  Acute blood loss anemia Presumably secondary to hematuria-as no other source of bleeding apparent-FOBT negative x 2 Hb stable after 2 units of PRBC Iron indices consistent with iron deficiency-Will dose 1 dose of IV iron. Follow CBC.  AKI on CKD stage IIIb AKI unclear etiology-possibly secondary to hypotensive episode that patient had several days back. No hydronephrosis seen on imaging studies Creatinine now improving and close to baseline. Avoid nephrotoxic agents. Follow electrolytes periodically.  Hypomagnesemia Repleted  Transaminitis ?  Shock liver from hypotension LFTs have significantly improved HIDA scan negative Suspect LFTs will improve with supportive care.  Hypotension Probably secondary to blood loss/use of antihypertensives BP now stable after supportive care Not felt to have adrenal insufficiency as a.m. cortisol was stable. Continue to monitor off antihypertensives  Acute CVA (2 punctate infarct right thalamus/left occipital area) In the setting of anemia/hypotension-advanced vasculopathy. Workup as above Restarted on IV  heparin  yesterday-no hematuria-suspect can be transition to Eliquis . Neurology following.  Carotid artery stenosis See carotid Doppler results above-appears to be asymptomatic Needs outpatient vascular surgery follow-up-sees Dr. Silver.  ?  UTI Urine culture negative Stop Rocephin   History of PAF Sinus rhythm Eliquis  held due to sinus rhythm-once hematuria resolved-rechallenge with IV heparin -no further hematuria overnight-suspect can be switched to Eliquis  today. Diltiazem  held due to hypotension on presentation.  QTc prolongation Telemetry monitoring Keep K> 4, Mg> 2  Repeat twelve-lead EKG 9/30-QTc has normalized.  History of PAD-numerous PCI's and right AKA  History of polycythemia vera Follows with hematology in the outpatient setting Evaluated by Dr. Feng-heme/onc on 9/28-not felt to have worsening anemia secondary to transformation to myelofibrosis-bone marrow biopsy not felt to be needed. No longer on Hydrea -due to anemia.  Previously used to get therapeutic phlebotomy Continue to follow CBC and follow-up with hematology in the outpatient setting.  HLD Zetia /Tricor  held due to transaminitis  HTN Antihypertensive held due to hypotension.  Chronic leukocytosis/thrombocytosis Follows with hematology/oncology See above  Chronic hypoxic respiratory failure on 2 L of oxygen. History of reactive airway disease Stable As needed bronchodilators.  Generalized anxiety disorder Stable Lexapro   Failure to thrive syndrome/severe protein calorie malnutrition PT/OT/dietitian eval.  Code status:   Code Status: Full Code   DVT Prophylaxis: SCDs Start: 09/16/24 1943   Family Communication: Daughter-Cary [phone] called-left VM 9/29   Disposition Plan: Status is: Inpatient Remains inpatient appropriate because: Severity of illness   Planned Discharge Destination:Home   Diet: Diet Order             Diet clear liquid Fluid consistency: Thin  Diet  effective now                     Antimicrobial agents: Anti-infectives (From admission, onward)    Start     Dose/Rate Route Frequency Ordered Stop   09/17/24 0000  cefTRIAXone  (ROCEPHIN ) 2 g in sodium chloride  0.9 % 100 mL IVPB        2 g 200 mL/hr over 30 Minutes Intravenous Every 24 hours 09/16/24 2355 09/19/24 0103   09/17/24 0000  metroNIDAZOLE (FLAGYL) IVPB 500 mg  Status:  Discontinued        500 mg 100 mL/hr over 60 Minutes Intravenous Every 12 hours 09/16/24 2355 09/18/24 0832        MEDICATIONS: Scheduled Meds:  Chlorhexidine  Gluconate Cloth  6 each Topical Daily   escitalopram   20 mg Oral Daily   feeding supplement  237 mL Oral TID BM   sodium chloride  flush  3 mL Intravenous Q12H   sodium chloride  flush  3 mL Intravenous Q12H   Continuous Infusions:  famotidine (PEPCID) IV 20 mg (09/18/24 2151)   heparin  600 Units/hr (09/19/24 0357)   PRN Meds:.acetaminophen , levalbuterol, methocarbamol, sodium chloride  flush, trimethobenzamide   I have personally reviewed following labs and imaging studies  LABORATORY DATA: CBC: Recent Labs  Lab 09/17/24 0800 09/17/24 1755 09/17/24 1756 09/18/24 0455 09/19/24 0240  WBC 27.7* 33.9* 33.7* 36.7* 32.0*  NEUTROABS  --   --  31.7*  --   --   HGB 7.5* 8.1* 8.0* 8.1* 7.9*  HCT 23.4* 24.7* 24.7* 24.7* 25.7*  MCV 74.1* 72.6* 72.2* 73.1* 74.7*  PLT 531* 592* 570* 521* 485*    Basic Metabolic Panel: Recent Labs  Lab 09/16/24 1800 09/17/24  9550 09/17/24 0800 09/17/24 1755 09/18/24 0455 09/19/24 0240  NA  --  130* 130* 130* 132* 129*  K  --  4.7 4.4 3.9 3.8 4.4  CL  --  104 102 98 101 101  CO2  --  14* 17* 19* 19* 19*  GLUCOSE  --  141* 159* 125* 120* 82  BUN  --  66* 65* 62* 62* 56*  CREATININE  --  2.30* 2.22* 2.07* 2.14* 1.79*  CALCIUM   --  6.8* 6.7* 6.7* 6.7* 7.0*  MG 1.5*  --   --   --  1.4* 2.7*    GFR: Estimated Creatinine Clearance: 18.4 mL/min (A) (by C-G formula based on SCr of 1.79 mg/dL  (H)).  Liver Function Tests: Recent Labs  Lab 09/16/24 1639 09/17/24 0449 09/17/24 0800 09/17/24 1755 09/19/24 0240  AST 265* 153* 128* 115* 89*  ALT 164* 134* 126* 127* 116*  ALKPHOS 82 72 64 68 58  BILITOT 0.8 0.7 0.8 0.9 0.6  PROT 5.1* 4.6* 4.4* 4.7* 4.3*  ALBUMIN  2.5* 2.2* 2.1* 2.3* 2.1*   No results for input(s): LIPASE, AMYLASE in the last 168 hours. No results for input(s): AMMONIA in the last 168 hours.  Coagulation Profile: Recent Labs  Lab 09/16/24 1800  INR 2.2*    Cardiac Enzymes: No results for input(s): CKTOTAL, CKMB, CKMBINDEX, TROPONINI in the last 168 hours.  BNP (last 3 results) No results for input(s): PROBNP in the last 8760 hours.  Lipid Profile: Recent Labs    09/17/24 0449 09/17/24 1756  CHOL 86  --   HDL <10*  --   LDLCALC NOT CALCULATED  --   TRIG 204*  --   CHOLHDL NOT CALCULATED  --   LDLDIRECT  --  21    Thyroid  Function Tests: Recent Labs    09/17/24 0449  TSH 1.742  FREET4 0.84    Anemia Panel: Recent Labs    09/17/24 1755  VITAMINB12 889  FOLATE 12.3  FERRITIN 20  TIBC 430  IRON <10*  RETICCTPCT 1.4    Urine analysis:    Component Value Date/Time   COLORURINE YELLOW 09/17/2024 0255   APPEARANCEUR CLOUDY (A) 09/17/2024 0255   LABSPEC 1.020 09/17/2024 0255   PHURINE 5.0 09/17/2024 0255   GLUCOSEU NEGATIVE 09/17/2024 0255   HGBUR MODERATE (A) 09/17/2024 0255   BILIRUBINUR NEGATIVE 09/17/2024 0255   KETONESUR NEGATIVE 09/17/2024 0255   PROTEINUR 100 (A) 09/17/2024 0255   NITRITE NEGATIVE 09/17/2024 0255   LEUKOCYTESUR LARGE (A) 09/17/2024 0255    Sepsis Labs: Lactic Acid, Venous    Component Value Date/Time   LATICACIDVEN 0.7 09/01/2024 0741    MICROBIOLOGY: Recent Results (from the past 240 hours)  Urine Culture (for pregnant, neutropenic or urologic patients or patients with an indwelling urinary catheter)     Status: None   Collection Time: 09/16/24  7:45 PM   Specimen: Urine,  Clean Catch  Result Value Ref Range Status   Specimen Description URINE, CLEAN CATCH  Final   Special Requests Immunocompromised  Final   Culture   Final    NO GROWTH Performed at [facility], [address] [place], [place] [address]    Report Status 09/18/2024 FINAL  Final  Culture, blood (Routine X 2) w Reflex to ID Panel     Status: None (Preliminary result)   Collection Time: 09/17/24  4:58 AM   Specimen: BLOOD LEFT ARM  Result Value Ref Range Status   Specimen Description BLOOD LEFT  ARM  Final   Special Requests   Final    BOTTLES DRAWN AEROBIC AND ANAEROBIC Blood Culture adequate volume   Culture   Final    NO GROWTH 2 DAYS Performed at [facility], [address] [place], [place] [address]    Report Status PENDING  Incomplete  Culture, blood (Routine X 2) w Reflex to ID Panel     Status: None (Preliminary result)   Collection Time: 09/17/24  5:02 AM   Specimen: BLOOD LEFT HAND  Result Value Ref Range Status   Specimen Description BLOOD LEFT HAND  Final   Special Requests   Final    BOTTLES DRAWN AEROBIC ONLY Blood Culture adequate volume   Culture   Final    NO GROWTH 2 DAYS Performed at [facility], [address], [place], [place] [address]    Report Status PENDING  Incomplete    RADIOLOGY STUDIES/RESULTS: ECHOCARDIOGRAM COMPLETE Result Date: 09/18/2024    ECHOCARDIOGRAM REPORT   Patient Name:   JIL PENLAND Date of Exam: 09/18/2024 Medical Rec #:  [id number]      Height:       64.0 in Accession #:    [id number]     Weight:       91.9 lb Date of Birth:  [birth year]       BSA:          1.406 m Patient Age:    73 years       BP:           123/57 mmHg Patient Gender: F              HR:           77 bpm. Exam Location:  Inpatient Procedure: 2D Echo, Cardiac Doppler and Color Doppler (Both Spectral and Color            Flow Doppler were utilized during procedure). Indications:    Stroke i63.9  History:        Patient has prior history of Echocardiogram  examinations, most                 recent 01/30/2024. CAD, CKD, Arrythmias:Atrial Fibrillation; Risk                 Factors:Hypertension and Dyslipidemia.  Sonographer:    Damien Senior RDCS Referring Phys: [id number] ARY CUMMINS  Sonographer Comments: Technically difficult due to thin body habitus IMPRESSIONS  1. Left ventricular ejection fraction, by estimation, is 45 to 50%. The left ventricle has mildly decreased function. The left ventricle demonstrates regional wall motion abnormalities (see scoring diagram/findings for description). There is mild asymmetric left ventricular hypertrophy of the septal segment. Left ventricular diastolic parameters are consistent with Grade II diastolic dysfunction (pseudonormalization).  2. Right ventricular systolic function is mildly reduced. The right ventricular size is mildly enlarged. Tricuspid regurgitation signal is inadequate for assessing PA pressure.  3. Left atrial size was severely dilated.  4. Right atrial size was mildly dilated.  5. The mitral valve is degenerative. Mild mitral valve regurgitation. Mild mitral stenosis. The mean mitral valve gradient is 5.0 mmHg with average heart rate of 78 bpm. Moderate mitral annular calcification.  6. The aortic valve is tricuspid. Aortic valve regurgitation is not visualized. Aortic valve sclerosis is present, with no evidence of aortic valve stenosis.  7. The inferior vena cava is normal in size with greater than 50% respiratory variability, suggesting right atrial pressure of 3 mmHg.  FINDINGS  Left Ventricle: Left ventricular ejection fraction, by estimation, is 45 to 50%. The left ventricle has mildly decreased function. The left ventricle demonstrates regional wall motion abnormalities. Definity contrast agent was given IV to delineate the left ventricular endocardial borders. The left ventricular internal cavity size was normal in size. There is mild asymmetric left ventricular hypertrophy of the septal segment. Left  ventricular diastolic function could not be evaluated due to mitral stenosis. Left ventricular diastolic parameters are consistent with Grade II diastolic dysfunction (pseudonormalization).  LV Wall Scoring: The mid and distal anterior septum is hypokinetic. Right Ventricle: The right ventricular size is mildly enlarged. No increase in right ventricular wall thickness. Right ventricular systolic function is mildly reduced. Tricuspid regurgitation signal is inadequate for assessing PA pressure. Left Atrium: Left atrial size was severely dilated. Right Atrium: Right atrial size was mildly dilated. Pericardium: There is no evidence of pericardial effusion. Mitral Valve: The mitral valve is degenerative in appearance. There is moderate calcification of the mitral valve leaflet(s). Moderate mitral annular calcification. Mild mitral valve regurgitation. Mild mitral valve stenosis. MV peak gradient, 9.2 mmHg. The mean mitral valve gradient is 5.0 mmHg with average heart rate of 78 bpm. Tricuspid Valve: The tricuspid valve is normal in structure. Tricuspid valve regurgitation is trivial. Aortic Valve: The aortic valve is tricuspid. Aortic valve regurgitation is not visualized. Aortic valve sclerosis is present, with no evidence of aortic valve stenosis. Pulmonic Valve: The pulmonic valve was grossly normal. Pulmonic valve regurgitation is trivial. Aorta: The aortic root and ascending aorta are structurally normal, with no evidence of dilitation. Venous: The inferior vena cava is normal in size with greater than 50% respiratory variability, suggesting right atrial pressure of 3 mmHg. IAS/Shunts: No atrial level shunt detected by color flow Doppler.  LEFT VENTRICLE PLAX 2D LVIDd:         3.60 cm   Diastology LVIDs:         2.80 cm   LV e' medial:    4.56 cm/s LV PW:         1.00 cm   LV E/e' medial:  34.2 LV IVS:        1.20 cm   LV e' lateral:   6.81 cm/s LVOT diam:     2.20 cm   LV E/e' lateral: 22.9 LV SV:         78 LV SV  Index:   55 LVOT Area:     3.80 cm LV IVRT:       70 msec  RIGHT VENTRICLE RV S prime:     6.36 cm/s TAPSE (M-mode): 1.6 cm LEFT ATRIUM             Index        RIGHT ATRIUM           Index LA diam:        4.00 cm 2.84 cm/m   RA Area:     18.70 cm LA Vol (A2C):   57.9 ml 41.18 ml/m  RA Volume:   53.30 ml  37.91 ml/m LA Vol (A4C):   90.7 ml 64.51 ml/m LA Biplane Vol: 73.5 ml 52.28 ml/m  AORTIC VALVE LVOT Vmax:   105.00 cm/s LVOT Vmean:  65.500 cm/s LVOT VTI:    0.204 m  AORTA Ao Root diam: 2.60 cm Ao Asc diam:  3.50 cm MITRAL VALVE MV Area (PHT): 3.31 cm     SHUNTS MV Area VTI:   2.03 cm  Systemic VTI:  0.20 m MV Peak grad:  9.2 mmHg     Systemic Diam: 2.20 cm MV Mean grad:  5.0 mmHg MV Vmax:       1.52 m/s MV Vmean:      103.2 cm/s MV Decel Time: 229 msec MV E velocity: 156.00 cm/s MV A velocity: 141.00 cm/s MV E/A ratio:  1.11 Morene Brownie Electronically signed by Morene Brownie Signature Date/Time: 09/18/2024/4:39:26 PM    Final    NM Hepatobiliary Liver Func Result Date: 09/18/2024 CLINICAL DATA:  Abdominal pain, acute, nonlocalized EXAM: NUCLEAR MEDICINE HEPATOBILIARY IMAGING TECHNIQUE: Sequential images of the abdomen were obtained out to 60 minutes following intravenous administration of radiopharmaceutical. RADIOPHARMACEUTICALS:  4.9 mCi Tc-62m  Choletec IV COMPARISON:  Right upper quadrant abdominal ultrasound 09/16/2024. Abdominopelvic CT 09/01/2024. FINDINGS: Prompt uptake and biliary excretion of activity by the liver is seen. Gallbladder activity is visualized, consistent with patency of cystic duct. Biliary activity passes into small bowel, consistent with patent common bile duct. No evidence of bile leak. IMPRESSION: Normal hepatobiliary scan. No evidence of cholecystitis, biliary obstruction or bile leak. Electronically Signed   By: Elsie Perone M.D.   On: 09/18/2024 14:50   VAS US  CAROTID Result Date: 09/18/2024 Carotid Arterial Duplex Study Patient Name:  TYREONA PANJWANI  Date  of Exam:   09/18/2024 Medical Rec #: [id number]       Accession #:    [id number] Date of Birth: 02-10-1951        Patient Gender: F Patient Age:   4 years Exam Location:  [facility] Procedure:      VAS US  CAROTID Referring Phys: ARY CUMMINS --------------------------------------------------------------------------------  Indications:       CVA, Carotid artery disease and Altered mental status. Risk Factors:      Hypertension, hyperlipidemia, past history of smoking, PAD. Other Factors:     CKD IIIa. Limitations        Today's exam was limited due to heavy calcification and the                    resulting shadowing and Tortuous vessels. Comparison Study:  Prior carotid duplex study done 12/13/23 indicating 1-39% ICA                    stenosis with severely tortuous vessels, and 11/25/22                    indicating 40-59% ICA stenosis with turbulent vessels. Performing Technologist: Alberta Lis RVS  Examination Guidelines: A complete evaluation includes B-mode imaging, spectral Doppler, color Doppler, and power Doppler as needed of all accessible portions of each vessel. Bilateral testing is considered an integral part of a complete examination. Limited examinations for reoccurring indications may be performed as noted.  Right Carotid Findings: +----------+--------+--------+--------+------------------+--------+           PSV cm/sEDV cm/sStenosisPlaque DescriptionComments +----------+--------+--------+--------+------------------+--------+ CCA Prox  126     17              heterogenous               +----------+--------+--------+--------+------------------+--------+ CCA Mid   76      19                                         +----------+--------+--------+--------+------------------+--------+ CCA Distal74      22  calcific                   +----------+--------+--------+--------+------------------+--------+ ICA Prox  235     62      60-79%  calcific           tortuous +----------+--------+--------+--------+------------------+--------+ ICA Mid   115     33                                tortuous +----------+--------+--------+--------+------------------+--------+ ICA Distal73      20                                tortuous +----------+--------+--------+--------+------------------+--------+ ECA       85      12                                tortuous +----------+--------+--------+--------+------------------+--------+ +----------+--------+-------+---------+-------------------+           PSV cm/sEDV cmsDescribe Arm Pressure (mmHG) +----------+--------+-------+---------+-------------------+ Subclavian224            Turbulent                    +----------+--------+-------+---------+-------------------+ +---------+--------+--+--------+--+ VertebralPSV cm/s74EDV cm/s17 +---------+--------+--+--------+--+  Left Carotid Findings: +----------+--------+--------+--------+------------------+--------+           PSV cm/sEDV cm/sStenosisPlaque DescriptionComments +----------+--------+--------+--------+------------------+--------+ CCA Prox  93      13              calcific                   +----------+--------+--------+--------+------------------+--------+ CCA Distal108     19              heterogenous               +----------+--------+--------+--------+------------------+--------+ ICA Prox  401     105     80-99%  calcific          tortuous +----------+--------+--------+--------+------------------+--------+ ICA Mid   91      26                                tortuous +----------+--------+--------+--------+------------------+--------+ ICA Distal88      26                                tortuous +----------+--------+--------+--------+------------------+--------+ ECA       88      10                                tortuous +----------+--------+--------+--------+------------------+--------+  +----------+--------+--------+---------+-------------------+           PSV cm/sEDV cm/sDescribe Arm Pressure (mmHG) +----------+--------+--------+---------+-------------------+ Subclavian213             Turbulent                    +----------+--------+--------+---------+-------------------+ +---------+--------+--+--------+--+ VertebralPSV cm/s53EDV cm/s17 +---------+--------+--+--------+--+   Summary: Right Carotid: Velocities in the right ICA are consistent with a 60-79%                stenosis. Left Carotid: Velocities in the left ICA are consistent with a 80-99% stenosis. Vertebrals:  Bilateral vertebral  arteries demonstrate antegrade flow. Subclavians: Bilateral subclavian artery flow was disturbed. *See table(s) above for measurements and observations.  Suggest Peripheral Vascular Consult.    Preliminary    MR BRAIN WO CONTRAST Result Date: 09/17/2024 EXAM: MRI BRAIN WITHOUT CONTRAST 09/17/2024 03:19:33 PM TECHNIQUE: Multiplanar multisequence MRI of the head/brain was performed without the administration of intravenous contrast. COMPARISON: None available. CLINICAL HISTORY: Concern for acute ischemic stroke. Tia with aphasia and weakness. Anemia. FINDINGS: BRAIN AND VENTRICLES: Punctate focus of diffusion abnormality is present in the anterior right thalamus. Focal restricted diffusion is present in the left occipital lobe. No intracranial hemorrhage. No mass. No midline shift. No hydrocephalus. Confluent Periventricular and scattered subcortical T2 hyperintensities bilaterally are moderately advanced for age. A remote lacunar infarct is present in the anterior right lentiform nucleus. A remote left paramedian pontine infarct demonstrates expected interval change. The sella is unremarkable. Normal flow voids. ORBITS: No acute abnormality. SINUSES AND MASTOIDS: No acute abnormality. BONES AND SOFT TISSUES: Normal marrow signal. No acute soft tissue abnormality. IMPRESSION: 1. Acute punctate  diffusion abnormality in the anterior right thalamus and focal restricted diffusion in the left occipital lobe, consistent with focal acute infarcts. 2. Moderately advanced confluent periventricular and scattered subcortical T2 hyperintensities bilaterally. 3. Remote lacunar infarct in the anterior right lentiform nucleus. 4. Remote left paramedian pontine infarct with expected interval change. Electronically signed by: Lonni Necessary MD 09/17/2024 03:58 PM EDT RP Workstation: [id number]   US  RENAL Result Date: 09/17/2024 CLINICAL DATA:  [id number] AKI (acute kidney injury) [id number] EXAM: RENAL / URINARY TRACT ULTRASOUND COMPLETE COMPARISON:  September 16, 2024 FINDINGS: Right Kidney: Renal measurements: 8.7 x 4.6 x 4.9 cm = volume: 101 mL. Echogenicity is mildly increased. No mass or hydronephrosis visualized. Left Kidney: Renal measurements: 10.3 x 3.6 x 4.8 cm = volume: 93 mL. Echogenicity is mildly increased. No mass or hydronephrosis visualized. Bladder: Decompressed around a Foley catheter. Other: Cholelithiasis.  Splenomegaly.  Trace ascites. IMPRESSION: 1. No hydronephrosis. 2. Mildly increased renal echogenicity as can be seen in medical renal disease. 3. Splenomegaly. 4. Cholelithiasis. Electronically Signed   By: Corean Salter M.D.   On: 09/17/2024 12:57     LOS: 3 days   Donalda Applebaum, MD  [facility]    To contact the attending provider between 7A-7P or the covering provider during after hours 7P-7A, please log into the web site [email or web address] and access using universal [place] password for that web site. If you do not have the password, please call the hospital operator.  09/19/2024, 9:29 AM

## 2024-09-19 NOTE — Telephone Encounter (Signed)
 Ellouise from [place] called to make Dr. Matilda aware of critical lab values. Nurse inquired why [name] is just calling d/t labs being drawn on 09/13/24. Ellouise stated [facility] had a different account number for Dr. Matilda. Nurse reviewed account number, address, contact info with Ellouise.  WBC- 53.7 Hgb- 4.6 Hct- 17.3 Platelets- 973 Dr. Matilda made aware of lab values. Nurse also made Dr. Matilda aware pt is currently admitted at [facility].

## 2024-09-19 NOTE — Care Management Important Message (Signed)
 Important Message  Patient Details  Name: [name] MRN: [id number] Date of Birth: Aug 16, 1951   Important Message Given:  Yes - Medicare IM     Claretta Deed 09/19/2024, 4:02 PM

## 2024-09-19 NOTE — Plan of Care (Signed)

## 2024-09-19 NOTE — Progress Notes (Signed)
 PHARMACY - ANTICOAGULATION CONSULT NOTE  Pharmacy Consult for heparin  Indication: atrial fibrillation  Labs: Recent Labs    09/16/24 1800 09/17/24 0449 09/17/24 1755 09/17/24 1756 09/18/24 0455 09/19/24 0240  HGB  --    < > 8.1* 8.0* 8.1* 7.9*  HCT  --    < > 24.7* 24.7* 24.7* 25.7*  PLT  --    < > 592* 570* 521* 485*  APTT 48*  --   --   --   --  36  LABPROT 25.6*  --   --   --   --   --   INR 2.2*  --   --   --   --   --   HEPARINUNFRC  --   --   --   --   --  0.32  CREATININE  --    < > 2.07*  --  2.14* 1.79*   < > = values in this interval not displayed.   Assessment: 7.yo female subtherapeutic on heparin  with initial dosing for Afib while DOAC on hold; RN notes that IV was lost ~10-15 min around MN, no signs of bleeding other than some oozing after IV came out, no signs of hematuria.  Goal of Therapy:  aPTT 66-85 seconds   Plan:  Increase heparin  infusion by 2-3 units/kg/hr to 600 units/hr. Check PTT in 8 hours.   Marvetta Dauphin, PharmD, BCPS 09/19/2024 3:52 AM

## 2024-09-19 NOTE — TOC Progression Note (Signed)
 Transition of Care [facility]) - Progression Note    Patient Details  Name: [name] MRN: [id number] Date of Birth: August 21, 1951  Transition of [facility]) CM/SW Contact  Marval Gell, RN Phone Number: 09/19/2024, 1:30 PM  Clinical Narrative:     Spoke w patient at bedside. She is agreeable to [facility] services, no preference for agency. [facility] agreeable to accept.  SOC for Thursday.  Patient has needed DME at home.  Daughter can transport   Expected Discharge Plan: Home w Home Health Services Barriers to Discharge: Continued Medical Work up               Expected Discharge Plan and Services   Discharge Planning Services: CM Consult Post Acute Care Choice: Home Health Living arrangements for the past 2 months: Single Family Home                           HH Arranged: PT, OT           Social Drivers of Health (SDOH) Interventions SDOH Screenings   Food Insecurity: No Food Insecurity (09/17/2024)  Housing: Low Risk  (09/17/2024)  Transportation Needs: No Transportation Needs (09/17/2024)  Utilities: Not At Risk (09/17/2024)  Alcohol  Screen: Low Risk  (12/01/2023)  Depression (PHQ2-9): Low Risk  (12/01/2023)  Financial Resource Strain: Low Risk  (06/07/2024)   Received from [facility]  Physical Activity: Inactive (02/10/2024)   Received from [facility]  Social Connections: Socially Isolated (09/17/2024)  Stress: No Stress Concern Present (06/06/2024)   Received from [facility]  Tobacco Use: Medium Risk (09/18/2024)  Health Literacy: Medium Risk (02/10/2024)   Received from [facility]    Readmission Risk Interventions    09/01/2024   10:48 AM 01/30/2024    3:07 PM  Readmission Risk Prevention Plan  Transportation Screening Complete Complete  PCP or Specialist Appt within 3-5 Days  Complete  HRI or Home Care Consult Complete Complete  Social Work Consult for Recovery Care Planning/Counseling Complete Complete  Palliative Care Screening Not Applicable  Not Applicable  Medication Review Oceanographer) Complete Complete

## 2024-09-19 NOTE — Progress Notes (Signed)
 PHARMACY - ANTICOAGULATION CONSULT NOTE  Pharmacy Consult:  Heparin  Indication: atrial fibrillation  Allergies  Allergen Reactions   Atenolol Rash and Itching   Demerol   [Meperidine  Hcl] Itching   Demerol  [Meperidine ] Itching and Other (See Comments)    Unknown, can't remember    Gabapentin  Anxiety and Other (See Comments)    SI and HI   Inderal [Propranolol] Other (See Comments) and Itching    Doesn't remember    Prednisone Other (See Comments)    Muscle spasms   Statins Itching and Other (See Comments)    Simvastatin  - caused severe itching Pravastatin  - caused lesser tiching One type of statin? Caused stroke in 2006, and other symptoms as result   Warfarin And Related Other (See Comments)    Caused nose bleeds   Crestor [Rosuvastatin] Itching and Rash   Docosahexaenoic Acid (Dha) (Fish)     nosebleeds   Lactose Intolerance (Gi) Other (See Comments)    Bloating, gas   Lipitor [Atorvastatin ] Rash   Lovaza [Omega-3-Acid Ethyl Esters (Fish)] Other (See Comments)    nosebleeds    Penicillins Hives, Rash and Other (See Comments)    Immediate rash, facial/tongue/throat swelling, SOB or lightheadedness with hypotension Questionable high fever    Pravastatin  Itching and Rash   Simvastatin  Itching, Rash and Other (See Comments)   Trilipix [Choline Fenofibrate ] Other (See Comments)    Doesn't remember    Warfarin Other (See Comments)    nosebleeds   Hydralazine  Swelling   Effexor [Venlafaxine Hcl] Other (See Comments)    Doesn't remember    Nexium [Esomeprazole Magnesium ] Rash   Percocet [Oxycodone -Acetaminophen ] Other (See Comments)    Doesn't remember    Plavix  [Clopidogrel  Bisulfate] Rash    Patient Measurements: Height: 5' 4 (162.6 cm) Weight: 41.7 kg (91 lb 14.9 oz) IBW/kg (Calculated) : 54.7 HEPARIN  DW (KG): 41.7  Vital Signs: Temp: 98.7 F (37.1 C) (09/30 0814) Temp Source: Oral (09/30 0814) BP: 127/64 (09/30 0814) Pulse Rate: 83 (09/30  0814)  Labs: Recent Labs    09/16/24 1800 09/17/24 0449 09/17/24 1755 09/17/24 1756 09/18/24 0455 09/19/24 0240  HGB  --    < > 8.1* 8.0* 8.1* 7.9*  HCT  --    < > 24.7* 24.7* 24.7* 25.7*  PLT  --    < > 592* 570* 521* 485*  APTT 48*  --   --   --   --  36  LABPROT 25.6*  --   --   --   --   --   INR 2.2*  --   --   --   --   --   HEPARINUNFRC  --   --   --   --   --  0.32  CREATININE  --    < > 2.07*  --  2.14* 1.79*   < > = values in this interval not displayed.    Estimated Creatinine Clearance: 18.4 mL/min (A) (by C-G formula based on SCr of 1.79 mg/dL (H)).   Assessment: 49 YOF with history of Afib on Eliquis  PTA.  Patient admitted with ABLA thought secondary to hematuria and new infarcts, so Eliquis  was held.  Pharmacy consulted to start IV heparin . CBC stable. No further issues with bleeding, now ok to stop heparin  and resume PTA apixaban .  Goal of Therapy:  Heparin  level 0.3-0.5 units/ml Monitor platelets by anticoagulation protocol: Yes   Plan:  Stop IV heparin  Resume PTA Apixaban  2.5 mg PO BID Continue to monitor H&H  and platelets   Thank you for allowing pharmacy to be a part of this patient's care.  Shelba Collier, PharmD, BCPS Clinical Pharmacist

## 2024-09-19 NOTE — Progress Notes (Signed)
 SLP Cancellation Note  Patient Details Name: [name] MRN: [id number] DOB: 1951/07/29   Cancelled treatment:       Reason Eval/Treat Not Completed: Pt passed the swallow screen and is currently on a regular diet without concerns from RN. Consider orders for cognitive-linguistic evaluation given acute CVA on MRI, which SLP will f/u to complete.    Damien Blumenthal, M.A., CCC-SLP Speech Language Pathology, Acute Rehabilitation Services  Secure Chat preferred [phone]  09/19/2024, 3:10 PM

## 2024-09-19 NOTE — Progress Notes (Addendum)
 STROKE TEAM PROGRESS NOTE    INTERIM HISTORY/SUBJECTIVE No family at bedside.  Patient sitting in chair, eating soup.  No acute event overnight.  Neuro stable.  Carotid Doppler showed left ICA high-grade stenosis.  OBJECTIVE  CBC    Component Value Date/Time   WBC 32.0 (H) 09/19/2024 0240   RBC 3.44 (L) 09/19/2024 0240   HGB 7.9 (L) 09/19/2024 0240   HGB 4.7 (LL) 09/13/2024 1554   HCT 25.7 (L) 09/19/2024 0240   HCT 17.3 (LL) 09/13/2024 1554   PLT 485 (H) 09/19/2024 0240   PLT 973 (HH) 09/13/2024 1554   MCV 74.7 (L) 09/19/2024 0240   MCV 72 (L) 09/13/2024 1554   MCH 23.0 (L) 09/19/2024 0240   MCHC 30.7 09/19/2024 0240   RDW 24.0 (H) 09/19/2024 0240   RDW 23.7 (H) 09/13/2024 1554   LYMPHSABS 0.3 (L) 09/17/2024 1756   LYMPHSABS 1.2 06/23/2023 1205   MONOABS 0.0 (L) 09/17/2024 1756   EOSABS 0.0 09/17/2024 1756   EOSABS 0.6 (H) 06/23/2023 1205   BASOSABS 0.7 (H) 09/17/2024 1756   BASOSABS 0.4 (H) 06/23/2023 1205    BMET    Component Value Date/Time   NA 129 (L) 09/19/2024 0240   NA 138 01/12/2024 1422   K 4.4 09/19/2024 0240   CL 101 09/19/2024 0240   CO2 19 (L) 09/19/2024 0240   GLUCOSE 82 09/19/2024 0240   BUN 56 (H) 09/19/2024 0240   BUN 28 (H) 01/12/2024 1422   CREATININE 1.79 (H) 09/19/2024 0240   CREATININE 1.44 (H) 08/03/2024 1024   CREATININE 1.33 (H) 03/03/2016 1531   CALCIUM  7.0 (L) 09/19/2024 0240   EGFR 31 (L) 01/12/2024 1422   GFRNONAA 30 (L) 09/19/2024 0240   GFRNONAA 39 (L) 08/03/2024 1024    IMAGING past 24 hours No results found.   Vitals:   09/18/24 2356 09/19/24 0408 09/19/24 0814 09/19/24 1201  BP: (!) 124/59 122/62 127/64 123/65  Pulse:   83 80  Resp:   16 17  Temp: 97.8 F (36.6 C) 97.8 F (36.6 C) 98.7 F (37.1 C) 98.1 F (36.7 C)  TempSrc: Oral Oral Oral Oral  SpO2:   93% 92%  Weight:      Height:         PHYSICAL EXAM General: Chronically ill-appearing elderly patient in no acute distress, right AKA Psych:  Mood and  affect appropriate for situation CV: Regular rate and rhythm on monitor Respiratory:  Regular, unlabored respirations on room air   NEURO:  Mental Status: AA&Ox3, patient is able to give some history Speech/Language: speech is without dysarthria or aphasia.  Naming, repetition, fluency, and comprehension intact.  Cranial Nerves:  II: PERRL. Visual fields full.  III, IV, VI: EOMI. Eyelids elevate symmetrically.  V: Sensation is intact to light touch and symmetrical to face.  VII: Subtle right facial droop VIII: hearing intact to voice. IX, X: Phonation is normal.  KP:Dynloizm shrug stronger on the left XII: tongue is midline without fasciculations. Motor: 5/5 strength to left upper and lower extremities, 0/5 strength to right upper extremity proximal and mild movement of 2-3 fingers on the R hand, 4/5 proximal strength to right lower extremity with AKA Tone: is normal and bulk is normal Sensation- Intact to light touch bilaterally.  Coordination: FTN intact on the left, unable to perform on the right Gait- deferred   ASSESSMENT/PLAN  Ms. DEBAR PLATE is a 73 y.o. female with history of A-fib on anticoagulation, stroke with residual right-sided hemiparesis and  polycythemia vera who presented with an episode of aphasia and worsening right arm weakness.  MRI brain is pending.  She was recently seen by her urologist for hematuria and was found to have a hemoglobin of 4.6 on admission.  NIH on Admission 10  Stroke: two punctate infarct at R thalamus and left occipital, etiology likely due to advanced vasculopathy in the setting of severe anemia Code Stroke CT head No acute abnormality. Small vessel disease. ASPECTS 10.    CTA head & neck no LVO, severe left subclavian artery stenosis, left vertebral artery occlusion in V1 and V2 segments, moderate narrowing in left P1 segment, moderate narrowing in bilateral distal vertebral arteries and mild narrowing in mid basilar artery MRI Acute  punctate diffusion abnormality in the anterior right thalamus and focal restricted diffusion in the left occipital lobe, consistent with focal acute infarcts. Remote lacunar infarct in the anterior right lentiform nucleus and left paramedian pons 2D Echo EF 45-50% Carotid doppler 60-79% right ICA stenosis and 80-99% left ICA stenosis.  Direct LDL 21, HDL < 10 HgbA1c 4.8 VTE prophylaxis - SCDs aspirin  81 mg daily and Eliquis  (apixaban ) daily prior to admission, now on heparin  IV after Hb stabilized, will switch back to eliquis  today Therapy recommendations: HH PT Disposition: Pending  Severe anemia Leukocytosis and thrombocytosis Hb 4.7--4.6--s/p PRBC -- 7.5--8.1-7.9 WBC 53.7--34.9--33.7--36.7--32 Platelet [phone]--570--521--485 Hematology on board No bone marrow biopsy needed at this time per hamatology Close monitoring Off Rocephin  and Flagyl  Carotid stenosis 09/2021 carotid Doppler left ICA 70% stenosis, right ICA 50 to 69% stenosis.   10/2021 MRA neck right ICA 30 to 40% stenosis, left ICA 70% stenosis.  CTA head and neck left ICA 60% stenosis. 01/2024 CT head and neck 50% left ICA stenosis, 55% right ICA stenosis.  This admission CUS R ICA 60-79% and left ICA 80-99% stenosis.  Still more asymptomatic at this time Will need close outpt VVS follow up, she sees Dr. Lanis as outpt  Hx of stroke 09/2021 admitted for tiny right SO infarct.  MRI showed bilateral A2 moderate stenosis, right P1/P2 stenosis, carotid Doppler left ICA 70% stenosis, right ICA 50 to 69% stenosis.  EF 70 to 75%.  Discharged on Eliquis . 10/2021 admitted for transient left sided numbness.  MRI negative.  MRA head and neck showed left subclavian steal, right ICA 30 to 40% stenosis, left ICA 70% stenosis.  CTA head and neck showed left subclavian severe stenosis proximal to via origin.  Left VA occlusion with distal reconstitution.  Left ICA 60% stenosis. 01/2024 admitted for acute left pontine infarct and  subacute bilateral hemispheric infarct.  CT head and neck 50% left ICA stenosis, 55% right ICA stenosis.  Innominate artery 70% stenosis.  Left subclavian severe stenosis with left VA occlusion.  50% bilateral ICA siphon stenosis.  EF 50 to 55%. Residue RUE plegia and RLE paresis 02/2024 status post left subclavian stent  Atrial fibrillation Home Meds: Flecainide  50 mg twice daily, Eliquis  2.5 mg twice daily Continue telemetry monitoring Resume home eliquis   Hypertension Home meds: Lisinopril  10 mg daily Stable Avoid low BP Long-term BP goal normotensive  Hyperlipidemia Home meds: Ezetimibe  10 mg daily and Tricor  145 Direct LDL 21 Patient intolerant to statin Transaminitis with AST/ALT 265/164--115/127--89/116 Resume Zetia  and Tricor  once LFT normalizes Continue on discharge  Dysphagia Patient has post-stroke dysphagia SLP consulted on clear liquid diet -> cardiac diet now  Hematuria Patient was recently seen by outpatient urologist for persistent hematuria Urology consulted, recommend outpatient follow-up  Patient was on Eliquis  2.5 mg twice daily at home Now hematuria has resolved  Other Stroke Risk Factors Advanced age PAD s/p R AKA and L finger  Other Active Problems AKI on CKD, creatinine 1.33--2.65--2.14--1.79 Hyponatremia sodium 126--130--132--129  Hospital day # 3  Neurology will sign off. Please call with questions. Pt will follow up with stroke clinic Dr. Rosemarie at [facility] in about 4 weeks. Thanks for the consult.   Ary Cummins, MD PhD Stroke Neurology 09/19/2024 4:40 PM   To contact Stroke Continuity provider, please refer to [email or web address]. After hours, contact General Neurology

## 2024-09-20 ENCOUNTER — Ambulatory Visit: Admitting: Family Medicine

## 2024-09-20 DIAGNOSIS — N3289 Other specified disorders of bladder: Secondary | ICD-10-CM | POA: Diagnosis not present

## 2024-09-20 DIAGNOSIS — I639 Cerebral infarction, unspecified: Secondary | ICD-10-CM | POA: Diagnosis not present

## 2024-09-20 DIAGNOSIS — D649 Anemia, unspecified: Secondary | ICD-10-CM | POA: Diagnosis not present

## 2024-09-20 DIAGNOSIS — E875 Hyperkalemia: Secondary | ICD-10-CM | POA: Diagnosis not present

## 2024-09-20 LAB — TYPE AND SCREEN
ABO/RH(D): A POS
Antibody Screen: NEGATIVE
Unit division: 0
Unit division: 0
Unit division: 0

## 2024-09-20 LAB — BPAM RBC
Blood Product Expiration Date: 202510252359
Blood Product Expiration Date: 202510252359
Blood Product Expiration Date: 202510252359
ISSUE DATE / TIME: 202509271847
ISSUE DATE / TIME: 202509272213
Unit Type and Rh: 6200
Unit Type and Rh: 6200
Unit Type and Rh: 6200

## 2024-09-20 LAB — CBC
HCT: 26 % — ABNORMAL LOW (ref 36.0–46.0)
Hemoglobin: 7.8 g/dL — ABNORMAL LOW (ref 12.0–15.0)
MCH: 23.1 pg — ABNORMAL LOW (ref 26.0–34.0)
MCHC: 30 g/dL (ref 30.0–36.0)
MCV: 76.9 fL — ABNORMAL LOW (ref 80.0–100.0)
Platelets: 352 K/uL (ref 150–400)
RBC: 3.38 MIL/uL — ABNORMAL LOW (ref 3.87–5.11)
RDW: 24.3 % — ABNORMAL HIGH (ref 11.5–15.5)
WBC: 24 K/uL — ABNORMAL HIGH (ref 4.0–10.5)
nRBC: 0.7 % — ABNORMAL HIGH (ref 0.0–0.2)

## 2024-09-20 MED ORDER — FAMOTIDINE 20 MG PO TABS: 20.0000 mg | ORAL_TABLET | Freq: Every day | ORAL | Status: DC

## 2024-09-20 MED ORDER — FERROUS SULFATE 325 (65 FE) MG PO TBEC
325.0000 mg | DELAYED_RELEASE_TABLET | Freq: Two times a day (BID) | ORAL | 3 refills | Status: DC
Start: 2024-09-20 — End: 2024-10-03

## 2024-09-20 NOTE — Progress Notes (Signed)
 Physical Therapy Treatment Patient Details Name: [name] MRN: [id number] DOB: 1951-07-28 Today's Date: 09/20/2024   History of Present Illness Pt is a 73 y/o F admitted on 09/16/24 after presenting with c/o fatigue, dizziness, ongoing hematuria x several weeks. While in the ED pt had stroke like symptoms. Pt found to have Hgb of 4, AKI, & acute CVA (MRI showed Acute punctate diffusion abnormality in the anterior R thalamus & focal restricted diffusion in the L occipital lobe, consistent with focal acute infarcts). PMH: polycythemia vera, CVA with R sided hemiplegia, PAD s/p numerous PCI, R AKA, a-fib on Eliquis , HFpEF, chronic hypoxic respiratory failure on 2L    PT Comments  Pt received in supine on RA, VSS, pt agreeable to therapy session with encouragement. Emphasis on bed mobility from bed simulated via pt typical technique, pt needing increased assist from baseline up to minA with mod cues to transfer to L EOB and needing max to totalA for lateral seated scoot toward her L side. Pt BP stable seated but softer after return to supine, HR/SpO2 WFL on RA throughout. TotalA for peri-care and bed pad change. Pt continues to benefit from PT services to progress toward functional mobility goals, DME may be beneficial to reduce caregiver burden, suggestions below in case family needs increased assist in the future to support pt and lower her fall risk. Of note, pt with skin breakdown on bottom and c/o discomfort, may need pressure relief cushion for WC/chairs at home if she does not have one already.     If plan is discharge home, recommend the following: A lot of help with walking and/or transfers;A lot of help with bathing/dressing/bathroom;Assistance with feeding;Assistance with cooking/housework;Help with stairs or ramp for entrance;Assist for transportation   Can travel by private vehicle        Equipment Recommendations  None recommended by PT (consider slide board, hospital bed, hoyer lift  for home if pt/family interested, to reduce caregiver burden)    Recommendations for Other Services       Precautions / Restrictions Precautions Precautions: Fall Recall of Precautions/Restrictions: Intact Precaution/Restrictions Comments: R AKA, R hemi, avoid low BP per neuro rec Restrictions Weight Bearing Restrictions Per Provider Order: No     Mobility  Bed Mobility Overal bed mobility: Needs Assistance Bed Mobility: Rolling, Sidelying to Sit Rolling: Min assist Sidelying to sit: Min assist (flat bed, no rails)       General bed mobility comments: to L EOB from flat bed/no rails (home set-up) pt able to perform with dense cues, increased time/effort and heavy minA/light modA via propping on to her L elbow and pushing up with LUE in logroll technique, pt had tried to use momentum of trunk/head to long sit prior to log roll but was unsuccessful with use of momentum.    Transfers Overall transfer level: Needs assistance Equipment used: None (bed pad assist)              Lateral/Scoot Transfers: Max assist, Total assist General transfer comment: Cues for use of LLE to assist with lateral scoot to raise her hips off bed surface to reduce risk of chafing of buttocks as pt has discomfort/skin breakdown there, but pt not able to achieve propulsion with LLE despite cues. Pt using LUE to assist and PTA using bed pad with at times maxA and at times TotalA to scoot to [facility] x3 successful scoots prior to return to supine. Pt defers STS trials.    Ambulation/Gait  Stairs             Wheelchair Mobility     Tilt Bed    Modified Rankin (Stroke Patients Only) Modified Rankin (Stroke Patients Only) Pre-Morbid Rankin Score: Severe disability Modified Rankin: Severe disability     Balance Overall balance assessment: Needs assistance Sitting-balance support: Feet supported, No upper extremity supported, Single extremity supported Sitting  balance-Leahy Scale: Fair Sitting balance - Comments: close supervision static sitting, pt tending to use LUE for support per preference Postural control: Left lateral lean     Standing balance comment: pt defers to attempt due to fatigue and upcoming DC                            Communication Communication Communication: No apparent difficulties  Cognition Arousal: Alert Behavior During Therapy: WFL for tasks assessed/performed, Flat affect   PT - Cognitive impairments: Problem solving, Sequencing, Initiation, Attention                       PT - Cognition Comments: Needs some redirection to task, pt reluctant to attempt transfers in way she is unfamiliar with, although she is unable to sit up EOB with her typical technique and only needed minA via technique PTA encouraged once pt agreeable. Pt appreciative of therapist assist at end of session. Following commands: Impaired Following commands impaired: Follows one step commands with increased time    Cueing Cueing Techniques: Verbal cues, Gestural cues, Tactile cues  Exercises Other Exercises Other Exercises: seated LLE AROM: hip flexion, LAQ x10 reps ea Other Exercises: seated chair push-up simulated with LUE and LLE (no hip clearance with attempts) x5 reps Other Exercises: seated LUE reaching anterior and lateral a few inches outside BOS x 3 reps ea direction    General Comments General comments (skin integrity, edema, etc.): BP checked seated initially, seated at 4-5 mins and once back in supine, noted BP drop once back in supine but had been stable while pt seated. SpO2/HR WFL on RA while pt seated.      Pertinent Vitals/Pain Pain Assessment Pain Assessment: Faces Faces Pain Scale: Hurts little more Pain Location: bottom during peri-care Pain Descriptors / Indicators: Discomfort, Moaning (calling out) Pain Intervention(s): Limited activity within patient's tolerance, Monitored during session,  Repositioned, Other (comment) (soiled bed linen removed and PTA provided peri-care (totalA as pt reports she does not do this for herself at home, daughter provides care), and applied barrier ointment after clean-up before purewick reapplied once back in supine.)    Home Living                          Prior Function            PT Goals (current goals can now be found in the care plan section) Acute Rehab PT Goals Patient Stated Goal: Per patient, to go home with assist from her daughter PT Goal Formulation: With patient Time For Goal Achievement: 10/02/24 Progress towards PT goals: Progressing toward goals    Frequency    Min 2X/week      PT Plan      Co-evaluation              AM-PAC PT 6 Clicks Mobility   Outcome Measure  Help needed turning from your back to your side while in a flat bed without using bedrails?: A Little Help needed moving from lying  on your back to sitting on the side of a flat bed without using bedrails?: A Little Help needed moving to and from a bed to a chair (including a wheelchair)?: Total Help needed standing up from a chair using your arms (e.g., wheelchair or bedside chair)?: Total Help needed to walk in hospital room?: Total Help needed climbing 3-5 steps with a railing? : Total 6 Click Score: 10    End of Session Equipment Utilized During Treatment: Other (comment) (received on RA, had [place] out of nares and SpO2 WFL on RA with supine/seated tasks) Activity Tolerance: Patient tolerated treatment well Patient left: in bed;with call bell/phone within reach;with bed alarm set;Other (comment) (bed in chair posture, HOB as high as she can tolerate, >45 degrees) Nurse Communication: Mobility status;Precautions;Need for lift equipment;Other (comment) (pt would need totalA for face to face squat pivot OOB to WC or +2 maxA lateral scoot pivot to drop arm chair/WC for DC) PT Visit Diagnosis: Muscle weakness (generalized) (M62.81);Other  abnormalities of gait and mobility (R26.89);Unsteadiness on feet (R26.81)     Time: 8992-8966 PT Time Calculation (min) (ACUTE ONLY): 26 min  Charges:    $Therapeutic Exercise: 8-22 mins $Therapeutic Activity: 8-22 mins PT General Charges $$ ACUTE PT VISIT: 1 Visit                     Taylan Mayhan P., PTA Acute Rehabilitation Services Secure Chat Preferred 9a-5:30pm Office: [phone]    Connell HERO [facility] 09/20/2024, 11:25 AM

## 2024-09-20 NOTE — TOC Transition Note (Signed)
 Transition of Care [facility]) - Discharge Note   Patient Details  Name: [name] MRN: [id number] Date of Birth: December 22, 1950  [facility] CM/SW Contact:  Marval Gell, RN Phone Number: 09/20/2024, 9:57 AM   Clinical Narrative:     Notified [facility] that patient will DC to home today.  No other ICM needs identified   Final next level of care: Home w Home Health Services Barriers to Discharge: No Barriers Identified   Patient Goals and CMS Choice Patient states their goals for this hospitalization and ongoing recovery are:: return home CMS Medicare.gov Compare Post Acute Care list provided to:: Patient Choice offered to / list presented to : Patient      Discharge Placement                       Discharge Plan and Services Additional resources added to the After Visit Summary for     Discharge Planning Services: CM Consult Post Acute Care Choice: Home Health                    HH Arranged: PT, OT          Social Drivers of Health (SDOH) Interventions SDOH Screenings   Food Insecurity: No Food Insecurity (09/17/2024)  Housing: Low Risk  (09/17/2024)  Transportation Needs: No Transportation Needs (09/17/2024)  Utilities: Not At Risk (09/17/2024)  Alcohol  Screen: Low Risk  (12/01/2023)  Depression (PHQ2-9): Low Risk  (12/01/2023)  Financial Resource Strain: Low Risk  (06/07/2024)   Received from [facility]  Physical Activity: Inactive (02/10/2024)   Received from [facility]  Social Connections: Socially Isolated (09/17/2024)  Stress: No Stress Concern Present (06/06/2024)   Received from [facility]  Tobacco Use: Medium Risk (09/18/2024)  Health Literacy: Medium Risk (02/10/2024)   Received from [facility]     Readmission Risk Interventions    09/01/2024   10:48 AM 01/30/2024    3:07 PM  Readmission Risk Prevention Plan  Transportation Screening Complete Complete  PCP or Specialist Appt within 3-5 Days  Complete  HRI or Home  Care Consult Complete Complete  Social Work Consult for Recovery Care Planning/Counseling Complete Complete  Palliative Care Screening Not Applicable Not Applicable  Medication Review Oceanographer) Complete Complete

## 2024-09-20 NOTE — Plan of Care (Signed)

## 2024-09-20 NOTE — Discharge Summary (Addendum)
 PATIENT DETAILS Name: [name] Age: 73 y.o. Sex: female Date of Birth: [birth year] MRN: [id number]. Admitting Physician: Subrina Sundil, MD ERE:Izuupwhzm, Fonda LABOR, MD  Admit Date: 09/16/2024 Discharge date: 09/20/2024  Recommendations for Outpatient Follow-up:  Follow up with PCP in 1-2 weeks Please obtain CMP/CBC in one week Please ensure follow-up with urology. Please ensure follow-up with vascular surgery Please ensure follow-up with neurology. Please ensure follow-up with hematology/oncology.  Admitted From:  Home  Disposition: Home health   Discharge Condition: good  CODE STATUS:   Code Status: Full Code   Diet recommendation:  Diet Order             Diet - low sodium heart healthy           Diet Heart Fluid consistency: Thin  Diet effective now                    Brief Summary: Patient is a 74 y.o.  female with history of polycythemia vera (JAK2 positive), CVA with right-sided hemiplegia, PAD-s/p numerous PCI-and right AKA, A-fib on Eliquis , HFpEF, chronic hypoxic respiratory failure on 2 L-who presented with fatigue/dizziness/ongoing hematuria (several weeks).  While in the ED-she had strokelike symptoms.  She was subsequently found to have severe anemia with hemoglobin of 4, AKI, and acute CVA.   Significant events: 9/27>> admit to [facility].   Significant studies: 9/27>> CT head: No acute intracranial abnormality 9/27>> CTA head/neck: No LVO-no significant stenosis in the head/neck.  Severe stenosis of left subclavian artery.  Left vertebral artery occlusion. 9/27>> RUQ ultrasound: Cholelithiasis-with additional findings consistent with sequelae of acute cholecystitis. 9/28>> renal ultrasound: No hydronephrosis. 9/28>> MRI brain: Acute infarct right thalamus/left occipital lobe. 9/28>> A1c: 4.8 9/28>> LDL: Not able to be calculated. 9/28>> acute hepatitis serology: Negative 9/28>> AM cortisol: 48.8 9/28>> stool FOBT x 2: Negative 9/29>> HIDA  scan: Negative 9/29>> TTE: EF 45-50% 9/29>> carotid Doppler: 60-79%, right side, 80-99% on left side.   Significant microbiology data: 9/28>> blood culture: No growth   Procedures: None   Consults: Hematology/oncology Neurology [facility] Course: Hematuria Likely secondary to combination of anticoagulation/bladder mass No further hematuria-challenge with IV heparin  on 9/29-switch to Eliquis  on 9/30-remains without hematuria on 10/1. Foley catheter removed on 9/30-voiding spontaneously Please ensure follow-up with urology for outpatient workup of bladder mass.  Per patient-she has an appointment upcoming next week.   Acute blood loss anemia Presumably secondary to hematuria-as no other source of bleeding apparent-FOBT negative x 2 Hb stable after 2 units of PRBC Iron indices consistent with iron deficiency-given 1 dose of IV iron in the hospital Will be discharged on oral iron Follow CBC closely in the outpatient setting.   AKI on CKD stage IIIb AKI unclear etiology-possibly secondary to hypotensive episode that patient had several days back. No hydronephrosis seen on imaging studies Creatinine now improving and close to baseline. Avoid nephrotoxic agents. Follow electrolytes periodically in the outpatient setting.   Hypomagnesemia Repleted   Transaminitis ?  Shock liver from hypotension LFTs have significantly improved HIDA scan negative-overall clinical scenario not consistent with cholecystitis. Suspect LFTs will improve with supportive care. Please repeat LFTs in 1 week at PCPs office   Hypotension Probably secondary to blood loss/use of antihypertensives BP now stable after supportive care Not felt to have adrenal insufficiency as a.m. cortisol was stable. BP now creeping up-all antihypertensives was held on admission-this will be resumed on discharge.   Acute CVA (2 punctate infarct right thalamus/left  occipital area) In the setting of  anemia/hypotension-advanced vasculopathy. Workup as above Restarted on IV heparin  on 9/29-subsequently switched to Eliquis  on 9/30-no further hematuria.   Please ensure follow-up with neurology.   Carotid artery stenosis See carotid Doppler results above-appears to be asymptomatic Needs outpatient vascular surgery follow-up-sees Dr. Silver. Will keep off aspirin -due to hematuria-acute blood loss-and the patient being on anticoagulation.   ?  UTI Urine culture negative Stop Rocephin    History of PAF Sinus rhythm Eliquis  held due to sinus rhythm-once hematuria resolved-rechallenge with IV heparin -no further hematuria overnight-suspect can be switched to Eliquis  today. Diltiazem  held due to hypotension on presentation-will be resumed on discharge.   QTc prolongation Keep K> 4, Mg> 2  Repeat twelve-lead EKG 9/30-QTc has normalized.   History of PAD-numerous PCI's and right AKA   History of polycythemia vera Follows with hematology in the outpatient setting Evaluated by Dr. Feng-heme/onc on 9/28-not felt to have worsening anemia secondary to transformation to myelofibrosis-bone marrow biopsy not felt to be needed. No longer on Hydrea -due to anemia.  Previously used to get therapeutic phlebotomy Continue to follow CBC and follow-up with hematology in the outpatient setting.   HLD Zetia /Tricor  held due to transaminitis   HTN Antihypertensive held due to hypotension-diltiazem  will be resumed on discharge-BP now creeping up. Continue to hold lisinopril /amlodipine -PCP to reassess and resume accordingly.   Chronic leukocytosis/thrombocytosis Follows with hematology/oncology See above   Chronic hypoxic respiratory failure on 2 L of oxygen. History of reactive airway disease Stable As needed bronchodilators.   Generalized anxiety disorder Stable Lexapro    Failure to thrive syndrome/severe protein calorie malnutrition PT/OT/dietitian eval. Being discharged with home health  services.  Discharge Diagnoses:  Principal Problem:   Symptomatic anemia Active Problems:   Leukocytosis   Hematuria   Bladder mass   Thrombocytosis   Hyponatremia   Transaminitis   Acute kidney injury superimposed on chronic kidney disease   Peripheral artery disease   Paroxysmal atrial fibrillation (HCC)   Essential hypertension   Generalized anxiety disorder   CKD stage 3b, GFR 30-44 ml/min (HCC)   History of CVA with residual deficit   Hyperkalemia   Metabolic acidosis   Weakness of left upper extremity-resolved   Stenosis of left subclavian artery s/p stent   History of CAD (coronary artery disease)   Chronic diastolic CHF (congestive heart failure) (HCC)   Adrenal crisis   Mood disorder   Chronic hypoxic respiratory failure (HCC)   Reactive airway disease   Prolonged Q-T interval on ECG   Acute cholecystitis with cholelithiasis   Discharge Instructions:  Activity:  As tolerated with Full fall precautions use walker/cane & assistance as needed  Discharge Instructions     Ambulatory referral to Neurology   Complete by: As directed    Follow up with Dr. Rosemarie at [facility] in 4-6 weeks. Too complicated for NP to follow. Thanks.   Call MD for:  extreme fatigue   Complete by: As directed    Call MD for:  persistant dizziness or light-headedness   Complete by: As directed    Diet - low sodium heart healthy   Complete by: As directed    Discharge instructions   Complete by: As directed    Follow with Primary MD  Dettinger, Fonda LABOR, MD in 1-2 weeks  Please get a complete blood count and chemistry panel checked by your Primary MD at your next visit, and again as instructed by your Primary MD.  Get Medicines reviewed and adjusted: Please take  all your medications with you for your next visit with your Primary MD  Laboratory/radiological data: Please request your Primary MD to go over all hospital tests and procedure/radiological results at the follow up, please ask  your Primary MD to get all Hospital records sent to his/her office.  In some cases, they will be blood work, cultures and biopsy results pending at the time of your discharge. Please request that your primary care M.D. follows up on these results.  Also Note the following: If you experience worsening of your admission symptoms, develop shortness of breath, life threatening emergency, suicidal or homicidal thoughts you must seek medical attention immediately by calling 911 or calling your MD immediately  if symptoms less severe.  You must read complete instructions/literature along with all the possible adverse reactions/side effects for all the Medicines you take and that have been prescribed to you. Take any new Medicines after you have completely understood and accpet all the possible adverse reactions/side effects.   Do not drive when taking Pain medications or sleeping medications (Benzodaizepines)  Do not take more than prescribed Pain, Sleep and Anxiety Medications. It is not advisable to combine anxiety,sleep and pain medications without talking with your primary care practitioner  Special Instructions: If you have smoked or chewed Tobacco  in the last 2 yrs please stop smoking, stop any regular Alcohol   and or any Recreational drug use.  Wear Seat belts while driving.  Please note: You were cared for by a hospitalist during your hospital stay. Once you are discharged, your primary care physician will handle any further medical issues. Please note that NO REFILLS for any discharge medications will be authorized once you are discharged, as it is imperative that you return to your primary care physician (or establish a relationship with a primary care physician if you do not have one) for your post hospital discharge needs so that they can reassess your need for medications and monitor your lab values.   Increase activity slowly   Complete by: As directed       Allergies as of 09/20/2024        Reactions   Atenolol Rash, Itching   Demerol   [meperidine  Hcl] Itching   Demerol  [meperidine ] Itching, Other (See Comments)   Unknown, can't remember    Gabapentin  Anxiety, Other (See Comments)   SI and HI   Inderal [propranolol] Other (See Comments), Itching   Doesn't remember    Prednisone Other (See Comments)   Muscle spasms   Statins Itching, Other (See Comments)   Simvastatin  - caused severe itching Pravastatin  - caused lesser tiching One type of statin? Caused stroke in 2006, and other symptoms as result   Warfarin And Related Other (See Comments)   Caused nose bleeds   Crestor [rosuvastatin] Itching, Rash   Docosahexaenoic Acid (dha) (fish)    nosebleeds   Lactose Intolerance (gi) Other (See Comments)   Bloating, gas   Lipitor [atorvastatin ] Rash   Lovaza [omega-3-acid Ethyl Esters (fish)] Other (See Comments)   nosebleeds   Penicillins Hives, Rash, Other (See Comments)   Immediate rash, facial/tongue/throat swelling, SOB or lightheadedness with hypotension Questionable high fever    Pravastatin  Itching, Rash   Simvastatin  Itching, Rash, Other (See Comments)   Trilipix [choline Fenofibrate ] Other (See Comments)   Doesn't remember    Warfarin Other (See Comments)   nosebleeds   Hydralazine  Swelling   Effexor [venlafaxine Hcl] Other (See Comments)   Doesn't remember    Nexium [esomeprazole Magnesium ] Rash  Percocet [oxycodone -acetaminophen ] Other (See Comments)   Doesn't remember    Plavix  [clopidogrel  Bisulfate] Rash        Medication List     STOP taking these medications    amLODipine  10 MG tablet Commonly known as: NORVASC    aspirin  EC 81 MG tablet   lisinopril  10 MG tablet Commonly known as: ZESTRIL        TAKE these medications    alendronate  70 MG tablet Commonly known as: FOSAMAX  TAKE ONE TABLET ONCE WEEKLY   apixaban  2.5 MG Tabs tablet Commonly known as: ELIQUIS  Take 1 tablet (2.5 mg total) by mouth 2 (two) times daily.    calcitRIOL 0.25 MCG capsule Commonly known as: ROCALTROL Take 0.25 mcg by mouth 2 (two) times a week.   colchicine  0.6 MG tablet TAKE ONE TABLET ONCE DAILY   diclofenac  Sodium 1 % Gel Commonly known as: VOLTAREN  Apply 2 g topically 4 (four) times daily as needed (pain).   diltiazem  240 MG 24 hr capsule Commonly known as: CARDIZEM  CD Take 240 mg by mouth daily.   escitalopram  20 MG tablet Commonly known as: LEXAPRO  TAKE ONE TABLET ONCE DAILY   ezetimibe  10 MG tablet Commonly known as: ZETIA  Take 1 tablet (10 mg total) by mouth daily.   fenofibrate  145 MG tablet Commonly known as: TRICOR  Take 1 tablet (145 mg total) by mouth daily.   ferrous sulfate 325 (65 FE) MG EC tablet Take 1 tablet (325 mg total) by mouth 2 (two) times daily.   flecainide  50 MG tablet Commonly known as: TAMBOCOR  Take 50 mg by mouth 2 (two) times daily.   furosemide  40 MG tablet Commonly known as: LASIX  Take 1 tablet (40 mg total) by mouth daily as needed for fluid or edema.   pantoprazole  40 MG tablet Commonly known as: PROTONIX  TAKE ONE TABLET ONCE DAILY   sodium bicarbonate  650 MG tablet Take 1 tablet (650 mg total) by mouth 2 (two) times daily.   zinc  oxide 20 % ointment Apply 1 Application topically as needed for irritation.        Follow-up Information     Rosemarie Eather RAMAN, MD. Schedule an appointment as soon as possible for a visit in 1 month(s).   Specialties: Neurology, Radiology Why: stroke clinic Contact information: [address] [place] [address] [phone]         Lanny Callander, MD. Schedule an appointment as soon as possible for a visit in 2 week(s).   Specialties: Hematology, Oncology Contact information: [address] [place] [address] [phone]         Lanis Fonda BRAVO, MD. Schedule an appointment as soon as possible for a visit in 2 week(s).   Specialty: Vascular Surgery Contact information: [address] [place] [address] [phone]         [facility] Follow up.   Why: Keep your existing appointment next week. Contact information: [address] [place] [address] [place]  [address] [phone]               Allergies  Allergen Reactions   Atenolol Rash and Itching   Demerol   [Meperidine  Hcl] Itching   Demerol  [Meperidine ] Itching and Other (See Comments)    Unknown, can't remember    Gabapentin  Anxiety and Other (See Comments)    SI and HI   Inderal [Propranolol] Other (See Comments) and Itching    Doesn't remember    Prednisone Other (See Comments)    Muscle spasms  Statins Itching and Other (See Comments)    Simvastatin  - caused severe itching Pravastatin  - caused lesser tiching One type of statin? Caused stroke in 2006, and other symptoms as result   Warfarin And Related Other (See Comments)    Caused nose bleeds   Crestor [Rosuvastatin] Itching and Rash   Docosahexaenoic Acid (Dha) (Fish)     nosebleeds   Lactose Intolerance (Gi) Other (See Comments)    Bloating, gas   Lipitor [Atorvastatin ] Rash   Lovaza [Omega-3-Acid Ethyl Esters (Fish)] Other (See Comments)    nosebleeds    Penicillins Hives, Rash and Other (See Comments)    Immediate rash, facial/tongue/throat swelling, SOB or lightheadedness with hypotension Questionable high fever    Pravastatin  Itching and Rash   Simvastatin  Itching, Rash and Other (See Comments)   Trilipix [Choline Fenofibrate ] Other (See Comments)    Doesn't remember    Warfarin Other (See Comments)    nosebleeds   Hydralazine  Swelling   Effexor [Venlafaxine Hcl] Other (See Comments)    Doesn't remember    Nexium [Esomeprazole Magnesium ] Rash   Percocet [Oxycodone -Acetaminophen ] Other (See Comments)    Doesn't remember    Plavix  [Clopidogrel  Bisulfate] Rash     Other Procedures/Studies: ECHOCARDIOGRAM COMPLETE Result Date: 09/18/2024    ECHOCARDIOGRAM REPORT   Patient Name:   [name] Date of Exam: 09/18/2024 Medical Rec #:  [id number]      Height:       64.0 in Accession #:    [id number]     Weight:       91.9 lb Date of Birth:  Mar 01, 1951       BSA:          1.406 m Patient Age:    73 years       BP:           123/57 mmHg Patient Gender: F              HR:           77 bpm. Exam Location:  Inpatient Procedure: 2D Echo, Cardiac Doppler and Color Doppler (Both Spectral and Color            Flow Doppler were utilized during procedure). Indications:    Stroke i63.9  History:        Patient has prior history of Echocardiogram examinations, most                 recent 01/30/2024. CAD, CKD, Arrythmias:Atrial Fibrillation; Risk                 Factors:Hypertension and Dyslipidemia.  Sonographer:    Damien Senior RDCS Referring Phys: [id number] ARY CUMMINS  Sonographer Comments: Technically difficult due to thin body habitus IMPRESSIONS  1. Left ventricular ejection fraction, by estimation, is 45 to 50%. The left ventricle has mildly decreased function. The left ventricle demonstrates regional wall motion abnormalities (see scoring diagram/findings for description). There is mild asymmetric left ventricular hypertrophy of the septal segment. Left ventricular diastolic parameters are consistent with Grade II diastolic dysfunction (pseudonormalization).  2. Right ventricular systolic function is mildly reduced. The right ventricular size is mildly enlarged. Tricuspid regurgitation signal is inadequate for assessing PA pressure.  3. Left atrial size was severely dilated.  4. Right atrial size was mildly dilated.  5. The mitral valve is degenerative. Mild mitral valve regurgitation. Mild mitral stenosis. The mean mitral valve gradient is 5.0 mmHg with average heart rate of 78 bpm. Moderate mitral annular  calcification.  6. The aortic valve is tricuspid. Aortic valve regurgitation is not visualized. Aortic valve sclerosis is present, with no evidence of aortic valve stenosis.  7. The inferior vena cava is  normal in size with greater than 50% respiratory variability, suggesting right atrial pressure of 3 mmHg. FINDINGS  Left Ventricle: Left ventricular ejection fraction, by estimation, is 45 to 50%. The left ventricle has mildly decreased function. The left ventricle demonstrates regional wall motion abnormalities. Definity contrast agent was given IV to delineate the left ventricular endocardial borders. The left ventricular internal cavity size was normal in size. There is mild asymmetric left ventricular hypertrophy of the septal segment. Left ventricular diastolic function could not be evaluated due to mitral stenosis. Left ventricular diastolic parameters are consistent with Grade II diastolic dysfunction (pseudonormalization).  LV Wall Scoring: The mid and distal anterior septum is hypokinetic. Right Ventricle: The right ventricular size is mildly enlarged. No increase in right ventricular wall thickness. Right ventricular systolic function is mildly reduced. Tricuspid regurgitation signal is inadequate for assessing PA pressure. Left Atrium: Left atrial size was severely dilated. Right Atrium: Right atrial size was mildly dilated. Pericardium: There is no evidence of pericardial effusion. Mitral Valve: The mitral valve is degenerative in appearance. There is moderate calcification of the mitral valve leaflet(s). Moderate mitral annular calcification. Mild mitral valve regurgitation. Mild mitral valve stenosis. MV peak gradient, 9.2 mmHg. The mean mitral valve gradient is 5.0 mmHg with average heart rate of 78 bpm. Tricuspid Valve: The tricuspid valve is normal in structure. Tricuspid valve regurgitation is trivial. Aortic Valve: The aortic valve is tricuspid. Aortic valve regurgitation is not visualized. Aortic valve sclerosis is present, with no evidence of aortic valve stenosis. Pulmonic Valve: The pulmonic valve was grossly normal. Pulmonic valve regurgitation is trivial. Aorta: The aortic root and ascending  aorta are structurally normal, with no evidence of dilitation. Venous: The inferior vena cava is normal in size with greater than 50% respiratory variability, suggesting right atrial pressure of 3 mmHg. IAS/Shunts: No atrial level shunt detected by color flow Doppler.  LEFT VENTRICLE PLAX 2D LVIDd:         3.60 cm   Diastology LVIDs:         2.80 cm   LV e' medial:    4.56 cm/s LV PW:         1.00 cm   LV E/e' medial:  34.2 LV IVS:        1.20 cm   LV e' lateral:   6.81 cm/s LVOT diam:     2.20 cm   LV E/e' lateral: 22.9 LV SV:         78 LV SV Index:   55 LVOT Area:     3.80 cm LV IVRT:       70 msec  RIGHT VENTRICLE RV S prime:     6.36 cm/s TAPSE (M-mode): 1.6 cm LEFT ATRIUM             Index        RIGHT ATRIUM           Index LA diam:        4.00 cm 2.84 cm/m   RA Area:     18.70 cm LA Vol (A2C):   57.9 ml 41.18 ml/m  RA Volume:   53.30 ml  37.91 ml/m LA Vol (A4C):   90.7 ml 64.51 ml/m LA Biplane Vol: 73.5 ml 52.28 ml/m  AORTIC VALVE LVOT Vmax:   105.00  cm/s LVOT Vmean:  65.500 cm/s LVOT VTI:    0.204 m  AORTA Ao Root diam: 2.60 cm Ao Asc diam:  3.50 cm MITRAL VALVE MV Area (PHT): 3.31 cm     SHUNTS MV Area VTI:   2.03 cm     Systemic VTI:  0.20 m MV Peak grad:  9.2 mmHg     Systemic Diam: 2.20 cm MV Mean grad:  5.0 mmHg MV Vmax:       1.52 m/s MV Vmean:      103.2 cm/s MV Decel Time: 229 msec MV E velocity: 156.00 cm/s MV A velocity: 141.00 cm/s MV E/A ratio:  1.11 Morene Brownie Electronically signed by Morene Brownie Signature Date/Time: 09/18/2024/4:39:26 PM    Final    NM Hepatobiliary Liver Func Result Date: 09/18/2024 CLINICAL DATA:  Abdominal pain, acute, nonlocalized EXAM: NUCLEAR MEDICINE HEPATOBILIARY IMAGING TECHNIQUE: Sequential images of the abdomen were obtained out to 60 minutes following intravenous administration of radiopharmaceutical. RADIOPHARMACEUTICALS:  4.9 mCi Tc-62m  Choletec IV COMPARISON:  Right upper quadrant abdominal ultrasound 09/16/2024. Abdominopelvic CT  09/01/2024. FINDINGS: Prompt uptake and biliary excretion of activity by the liver is seen. Gallbladder activity is visualized, consistent with patency of cystic duct. Biliary activity passes into small bowel, consistent with patent common bile duct. No evidence of bile leak. IMPRESSION: Normal hepatobiliary scan. No evidence of cholecystitis, biliary obstruction or bile leak. Electronically Signed   By: Elsie Perone M.D.   On: 09/18/2024 14:50   VAS US  CAROTID Result Date: 09/18/2024 Carotid Arterial Duplex Study Patient Name:  SAVANAH BAYLES  Date of Exam:   09/18/2024 Medical Rec #: [id number]       Accession #:    [id number] Date of Birth: Jul 08, 1951        Patient Gender: F Patient Age:   89 years Exam Location:  [facility] Procedure:      VAS US  CAROTID Referring Phys: ARY CUMMINS --------------------------------------------------------------------------------  Indications:       CVA, Carotid artery disease and Altered mental status. Risk Factors:      Hypertension, hyperlipidemia, past history of smoking, PAD. Other Factors:     CKD IIIa. Limitations        Today's exam was limited due to heavy calcification and the                    resulting shadowing and Tortuous vessels. Comparison Study:  Prior carotid duplex study done 12/13/23 indicating 1-39% ICA                    stenosis with severely tortuous vessels, and 11/25/22                    indicating 40-59% ICA stenosis with turbulent vessels. Performing Technologist: Alberta Lis RVS  Examination Guidelines: A complete evaluation includes B-mode imaging, spectral Doppler, color Doppler, and power Doppler as needed of all accessible portions of each vessel. Bilateral testing is considered an integral part of a complete examination. Limited examinations for reoccurring indications may be performed as noted.  Right Carotid Findings: +----------+--------+--------+--------+------------------+--------+           PSV cm/sEDV cm/sStenosisPlaque  DescriptionComments +----------+--------+--------+--------+------------------+--------+ CCA Prox  126     17              heterogenous               +----------+--------+--------+--------+------------------+--------+ CCA Mid   76      19                                         +----------+--------+--------+--------+------------------+--------+  CCA Distal74      22              calcific                   +----------+--------+--------+--------+------------------+--------+ ICA Prox  235     62      60-79%  calcific          tortuous +----------+--------+--------+--------+------------------+--------+ ICA Mid   115     33                                tortuous +----------+--------+--------+--------+------------------+--------+ ICA Distal73      20                                tortuous +----------+--------+--------+--------+------------------+--------+ ECA       85      12                                tortuous +----------+--------+--------+--------+------------------+--------+ +----------+--------+-------+---------+-------------------+           PSV cm/sEDV cmsDescribe Arm Pressure (mmHG) +----------+--------+-------+---------+-------------------+ Subclavian224            Turbulent                    +----------+--------+-------+---------+-------------------+ +---------+--------+--+--------+--+ VertebralPSV cm/s74EDV cm/s17 +---------+--------+--+--------+--+  Left Carotid Findings: +----------+--------+--------+--------+------------------+--------+           PSV cm/sEDV cm/sStenosisPlaque DescriptionComments +----------+--------+--------+--------+------------------+--------+ CCA Prox  93      13              calcific                   +----------+--------+--------+--------+------------------+--------+ CCA Distal108     19              heterogenous               +----------+--------+--------+--------+------------------+--------+  ICA Prox  401     105     80-99%  calcific          tortuous +----------+--------+--------+--------+------------------+--------+ ICA Mid   91      26                                tortuous +----------+--------+--------+--------+------------------+--------+ ICA Distal88      26                                tortuous +----------+--------+--------+--------+------------------+--------+ ECA       88      10                                tortuous +----------+--------+--------+--------+------------------+--------+ +----------+--------+--------+---------+-------------------+           PSV cm/sEDV cm/sDescribe Arm Pressure (mmHG) +----------+--------+--------+---------+-------------------+ Subclavian213             Turbulent                    +----------+--------+--------+---------+-------------------+ +---------+--------+--+--------+--+ VertebralPSV cm/s53EDV cm/s17 +---------+--------+--+--------+--+   Summary: Right Carotid: Velocities in the right ICA are consistent with a 60-79%  stenosis. Left Carotid: Velocities in the left ICA are consistent with a 80-99% stenosis. Vertebrals:  Bilateral vertebral arteries demonstrate antegrade flow. Subclavians: Bilateral subclavian artery flow was disturbed. *See table(s) above for measurements and observations.  Suggest Peripheral Vascular Consult.    Preliminary    MR BRAIN WO CONTRAST Result Date: 09/17/2024 EXAM: MRI BRAIN WITHOUT CONTRAST 09/17/2024 03:19:33 PM TECHNIQUE: Multiplanar multisequence MRI of the head/brain was performed without the administration of intravenous contrast. COMPARISON: None available. CLINICAL HISTORY: Concern for acute ischemic stroke. Tia with aphasia and weakness. Anemia. FINDINGS: BRAIN AND VENTRICLES: Punctate focus of diffusion abnormality is present in the anterior right thalamus. Focal restricted diffusion is present in the left occipital lobe. No intracranial hemorrhage. No  mass. No midline shift. No hydrocephalus. Confluent Periventricular and scattered subcortical T2 hyperintensities bilaterally are moderately advanced for age. A remote lacunar infarct is present in the anterior right lentiform nucleus. A remote left paramedian pontine infarct demonstrates expected interval change. The sella is unremarkable. Normal flow voids. ORBITS: No acute abnormality. SINUSES AND MASTOIDS: No acute abnormality. BONES AND SOFT TISSUES: Normal marrow signal. No acute soft tissue abnormality. IMPRESSION: 1. Acute punctate diffusion abnormality in the anterior right thalamus and focal restricted diffusion in the left occipital lobe, consistent with focal acute infarcts. 2. Moderately advanced confluent periventricular and scattered subcortical T2 hyperintensities bilaterally. 3. Remote lacunar infarct in the anterior right lentiform nucleus. 4. Remote left paramedian pontine infarct with expected interval change. Electronically signed by: Lonni Necessary MD 09/17/2024 03:58 PM EDT RP Workstation: [id number]   US  RENAL Result Date: 09/17/2024 CLINICAL DATA:  [id number] AKI (acute kidney injury) [id number] EXAM: RENAL / URINARY TRACT ULTRASOUND COMPLETE COMPARISON:  September 16, 2024 FINDINGS: Right Kidney: Renal measurements: 8.7 x 4.6 x 4.9 cm = volume: 101 mL. Echogenicity is mildly increased. No mass or hydronephrosis visualized. Left Kidney: Renal measurements: 10.3 x 3.6 x 4.8 cm = volume: 93 mL. Echogenicity is mildly increased. No mass or hydronephrosis visualized. Bladder: Decompressed around a Foley catheter. Other: Cholelithiasis.  Splenomegaly.  Trace ascites. IMPRESSION: 1. No hydronephrosis. 2. Mildly increased renal echogenicity as can be seen in medical renal disease. 3. Splenomegaly. 4. Cholelithiasis. Electronically Signed   By: Corean Salter M.D.   On: 09/17/2024 12:57   US  Abdomen Limited RUQ (LIVER/GB) Result Date: 09/16/2024 CLINICAL DATA:  Transaminitis. EXAM: ULTRASOUND  ABDOMEN LIMITED RIGHT UPPER QUADRANT COMPARISON:  None Available. FINDINGS: Gallbladder: Multiple shadowing, echogenic, mobile gallstones are seen within the gallbladder lumen. The largest measures approximately 1.3 cm. Gallbladder wall thickening is seen (7.0 mm). Pericholecystic fluid is also present. No sonographic Murphy sign noted by sonographer. Common bile duct: Diameter: 5.1 mm Liver: No focal lesion identified. Within normal limits in parenchymal echogenicity. Portal vein is patent on color Doppler imaging with normal direction of blood flow towards the liver. Other: Of incidental note is the presence of a right sided pleural effusion. IMPRESSION: 1. Cholelithiasis with additional findings consistent with sequelae associated with acute cholecystitis. 2. Right pleural effusion. Electronically Signed   By: Suzen Dials M.D.   On: 09/16/2024 23:35   CT ANGIO HEAD NECK W WO CM (CODE STROKE) Result Date: 09/16/2024 EXAM: CTA HEAD AND NECK WITH AND WITHOUT 09/16/2024 06:24:15 PM TECHNIQUE: CTA of the head and neck was performed with and without the administration of 50 mL of intravenous contrast (iohexol  (OMNIPAQUE ) 350 MG/ML injection 75 mL IOHEXOL  350 MG/ML SOLN). Multiplanar 2D and/or 3D reformatted images are provided for review. Automated exposure control, iterative reconstruction,  and/or weight based adjustment of the mA/kV was utilized to reduce the radiation dose to as low as reasonably achievable. Stenosis of the internal carotid arteries measured using NASCET criteria. COMPARISON: CT angio head and neck 02/09/2024 at [facility] and CTA head and neck 03/04/2021 at [facility]. CLINICAL HISTORY: Neuro deficit, acute, stroke suspected. FINDINGS: CTA NECK: AORTIC ARCH AND ARCH VESSELS: Extensive atherosclerotic changes are again noted at the aortic arch and great vessel origins. Severe stenosis of the left subclavian artery proximal to the vertebral artery has again noted. No dissection  or arterial injury. No significant stenosis of the brachiocephalic artery. CERVICAL CAROTID ARTERIES: Atherosclerotic calcifications are present throughout the right common carotid artery without a significant stenosis relative to the more distal vessel. Atherosclerotic calcifications are present in the proximal right ICA without significant stenosis. Diffuse mural calcification are present throughout the left common carotid artery without significant stenosis. Patient motion is present through the area of stenosis previously noted in the proximal left ICA. The stenosis is not significantly changed. No dissection or arterial injury. CERVICAL VERTEBRAL ARTERIES: The left vertebral artery is occluded in the V1 and proximal left V2 segments. It is partially reconstituted in the neck. The right vertebral artery shows no dissection, arterial injury, or significant stenosis in its cervical segments. LUNGS AND MEDIASTINUM: Unremarkable. SOFT TISSUES: No acute abnormality. BONES: Multilevel degenerative changes are again noted in the cervical spine. CTA HEAD: ANTERIOR CIRCULATION: Atherosclerotic calcifications are present within the cavernous internal carotid arteries bilaterally without significant stenoses through the ICA terminae. New left A1 segment is hypoplastic. No significant stenosis of the middle cerebral arteries. No aneurysm. POSTERIOR CIRCULATION: A fetal-type right cerebral artery is again noted. Moderate narrowing is present in the left P1 segment. Stable mild narrowing in the mid basilar artery is stable. Moderate narrowing is present in the distal vertebral arteries bilaterally. No aneurysm. OTHER: ECA branch vessels are opacified bilaterally. No dural venous sinus thrombosis on this non-dedicated study. IMPRESSION: 1. No large vessel occlusion or aneurysm in the head or neck. 2. 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 3. 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 4. Moderate narrowing in the left P1 segment, distal vertebral arteries bilaterally, and stable mild narrowing in the mid basilar artery. 5. Extensive atherosclerotic changes as described. Findings were communicated to doctor Michaela via the amion system at 06:48 pm Electronically signed by: Lonni Necessary MD 09/16/2024 07:00 PM EDT RP Workstation: [id number]   CT HEAD CODE STROKE WO CONTRAST Result Date: 09/16/2024 EXAM: CT HEAD WITHOUT CONTRAST 09/16/2024 06:12:59 PM TECHNIQUE: CT of the head was performed without the administration of intravenous contrast. Automated exposure control, iterative reconstruction, and/or weight based adjustment of the mA/kV was utilized to reduce the radiation dose to as low as reasonably achievable. The study is mildly degraded by patient motion. COMPARISON: CT head without contrast 01/11/2024. MR head without contrast 03/09/2024 at [facility]. CLINICAL HISTORY: Neuro deficit, acute, stroke suspected. FINDINGS: BRAIN AND VENTRICLES: No acute hemorrhage. No evidence of acute infarct. No hydrocephalus. No extra-axial collection. No mass effect or midline shift. Remote lacunar infarcts are again noted within the right caudate head and internal capsule. Confluent Periventricular and scattered subcortical white matter hypoattenuation is moderately advanced for age. This most likely reflects the sequelae of chronic microvascular ischemia. Atherosclerotic calcifications are present in the cavernous carotid arteries bilaterally and at the dural margin of  both vertebral arteries. No hyperdense vessel is present. ORBITS: No acute abnormality. SINUSES: No acute abnormality. SOFT TISSUES AND SKULL: No acute soft tissue abnormality. No skull fracture. [place] stroke program early CT (aspect) score: Ganglionic (caudate, ic, Lentiform Nucleus, insula, M1-m3): 7 Supraganglionic (m4-m6): 3 Total: 10  IMPRESSION: 1. No acute intracranial abnormality.  ASPECTS 10/10 2. Remote lacunar infarcts in the right caudate head and internal capsule. 3. Moderately advanced confluent periventricular and scattered subcortical white matter hypoattenuation, likely sequelae of chronic microvascular ischemia. 4. Atherosclerotic calcifications in the cavernous carotid arteries bilaterally and at the dural margin of both vertebral arteries. No hyperdense vessel is present. The pertinent results were texted to Dr. Michaela via the [facility] system at 6:21 pm. Electronically signed by: Lonni Necessary MD 09/16/2024 06:23 PM EDT RP Workstation: [id number]   DG Chest Portable 1 View Result Date: 09/07/2024 CLINICAL DATA:  Sob EXAM: PORTABLE CHEST 1 VIEW COMPARISON:  Chest x-ray 08/24/2024, CT chest 01/30/2024 FINDINGS: The heart and mediastinal contours are unchanged. Atherosclerotic plaque. Mitral annular calcification. No focal consolidation. Increased interstitial markings . No pleural effusion. No pneumothorax. No acute osseous abnormality. IMPRESSION: 1. Pulmonary edema. 2.  Aortic Atherosclerosis (ICD10-I70.0). Electronically Signed   By: Morgane  Naveau M.D.   On: 09/07/2024 00:57   CT Renal Stone Study Result Date: 09/01/2024 EXAM: CT UROGRAM 09/01/2024 03:10:35 AM TECHNIQUE: CT of the abdomen and pelvis was performed without IV contrast. Automated exposure control, iterative reconstruction, and/or weight based adjustment of the mA/kV was utilized to reduce the radiation dose to as low as reasonably achievable. COMPARISON: 08/26/2022 CLINICAL HISTORY: Abdominal/flank pain, stone suspected. Patient from home for blood in urine with blood clots that started today. Reports painful urination. FINDINGS: LOWER CHEST: No acute abnormality. LIVER: The liver is unremarkable. GALLBLADDER AND BILE DUCTS: Cholelithiasis without evidence of acute cholecystitis. SPLEEN: Markedly enlarged spleen measuring 19.1 cm in craniocaudal  dimension. This has increased from 08/26/22, when it measured 17.0 cm. PANCREAS: No acute abnormality. ADRENAL GLANDS: No acute abnormality. KIDNEYS, URETERS AND BLADDER: No urinary calculi or hydronephrosis. Vascular calcifications in the bilateral renal arteries. Mild perivesical fat stranding. Correlate for urinalysis for cystitis. 1.3 x 1.3 cm mural nodule in the posterior left bladder (series 2 image 74). This is incompletely evaluated without IV contrast. In the setting of hematuria, this could represent a blood clot. However bladder malignancy is not excluded. Cystoscopy is recommended to further evaluate. GI AND BOWEL: Stool ball in the rectum. There is no bowel obstruction. PERITONEUM AND RETROPERITONEUM: No ascites. No free air. VASCULATURE: Advanced aortic atherosclerotic calcification. LYMPH NODES: No lymphadenopathy. REPRODUCTIVE ORGANS: No acute abnormality. BONES AND SOFT TISSUES: No acute osseous abnormality. No focal soft tissue abnormality. IMPRESSION: 1. 1.3 cm mural nodule in the posterior left bladder, incompletely evaluated without IV contrast. Differential includes blood clot versus malignancy. Cystoscopy is recommended. 2. Mild perivesical fat stranding, correlate for urinalysis for cystitis. 3. Markedly enlarged spleen measuring 19.1 cm, increased from 17.0 cm on 08/26/22. 4. Fecal ball in the rectum. Correlate for constipation. Electronically signed by: Norman Gatlin MD 09/01/2024 03:43 AM EDT RP Workstation: [id number]   DG Chest 2 View Result Date: 08/24/2024 CLINICAL DATA:  Thick yellow sputum. EXAM: CHEST - 2 VIEW COMPARISON:  Portable chest 02/09/2024. FINDINGS: Stable mild cardiomegaly. The mitral ring is heavily calcified. There is patchy calcification of the thoracic aorta. The mediastinum is normally outlined. No vascular congestion is seen. The lungs are clear. The sulci are sharp. There was previously interstitial edema which has  cleared. Thoracic cage is intact with osteopenia  and thoracic kyphosis. IMPRESSION: No evidence of acute chest disease. Stable mild cardiomegaly. Aortic atherosclerosis. Electronically Signed   By: Francis Quam M.D.   On: 08/24/2024 01:32     TODAY-DAY OF DISCHARGE:  Subjective:   [name] today has no headache,no chest abdominal pain,no new weakness tingling or numbness, feels much better wants to go home today.   Objective:   Blood pressure (!) 145/83, pulse 83, temperature 98.2 F (36.8 C), temperature source Oral, resp. rate 14, height 5' 4 (1.626 m), weight 41.7 kg, SpO2 99%.  Intake/Output Summary (Last 24 hours) at 09/20/2024 0846 Last data filed at 09/20/2024 0758 Gross per 24 hour  Intake --  Output 1150 ml  Net -1150 ml   Filed Weights   09/16/24 1910  Weight: 41.7 kg    Exam: Awake Alert, Oriented *3, No new F.N deficits, Normal affect [place].AT,PERRAL Supple Neck,No JVD, No cervical lymphadenopathy appriciated.  Symmetrical Chest wall movement, Good air movement bilaterally, CTAB RRR,No Gallops,Rubs or new Murmurs, No Parasternal Heave +ve B.Sounds, Abd Soft, Non tender, No organomegaly appriciated, No rebound -guarding or rigidity. No Cyanosis, Clubbing or edema, No new Rash or bruise   PERTINENT RADIOLOGIC STUDIES: ECHOCARDIOGRAM COMPLETE Result Date: 09/18/2024    ECHOCARDIOGRAM REPORT   Patient Name:   [name] Date of Exam: 09/18/2024 Medical Rec #:  [id number]      Height:       64.0 in Accession #:    [id number]     Weight:       91.9 lb Date of Birth:  [birth year]       BSA:          1.406 m Patient Age:    73 years       BP:           123/57 mmHg Patient Gender: F              HR:           77 bpm. Exam Location:  Inpatient Procedure: 2D Echo, Cardiac Doppler and Color Doppler (Both Spectral and Color            Flow Doppler were utilized during procedure). Indications:    Stroke i63.9  History:        Patient has prior history of Echocardiogram examinations, most                 recent 01/30/2024. CAD,  CKD, Arrythmias:Atrial Fibrillation; Risk                 Factors:Hypertension and Dyslipidemia.  Sonographer:    Damien Senior RDCS Referring Phys: [id number] ARY CUMMINS  Sonographer Comments: Technically difficult due to thin body habitus IMPRESSIONS  1. Left ventricular ejection fraction, by estimation, is 45 to 50%. The left ventricle has mildly decreased function. The left ventricle demonstrates regional wall motion abnormalities (see scoring diagram/findings for description). There is mild asymmetric left ventricular hypertrophy of the septal segment. Left ventricular diastolic parameters are consistent with Grade II diastolic dysfunction (pseudonormalization).  2. Right ventricular systolic function is mildly reduced. The right ventricular size is mildly enlarged. Tricuspid regurgitation signal is inadequate for assessing PA pressure.  3. Left atrial size was severely dilated.  4. Right atrial size was mildly dilated.  5. The mitral valve is degenerative. Mild mitral valve regurgitation. Mild mitral stenosis. The mean mitral valve gradient is 5.0 mmHg with average heart rate of 78 bpm.  Moderate mitral annular calcification.  6. The aortic valve is tricuspid. Aortic valve regurgitation is not visualized. Aortic valve sclerosis is present, with no evidence of aortic valve stenosis.  7. The inferior vena cava is normal in size with greater than 50% respiratory variability, suggesting right atrial pressure of 3 mmHg. FINDINGS  Left Ventricle: Left ventricular ejection fraction, by estimation, is 45 to 50%. The left ventricle has mildly decreased function. The left ventricle demonstrates regional wall motion abnormalities. Definity contrast agent was given IV to delineate the left ventricular endocardial borders. The left ventricular internal cavity size was normal in size. There is mild asymmetric left ventricular hypertrophy of the septal segment. Left ventricular diastolic function could not be evaluated due to  mitral stenosis. Left ventricular diastolic parameters are consistent with Grade II diastolic dysfunction (pseudonormalization).  LV Wall Scoring: The mid and distal anterior septum is hypokinetic. Right Ventricle: The right ventricular size is mildly enlarged. No increase in right ventricular wall thickness. Right ventricular systolic function is mildly reduced. Tricuspid regurgitation signal is inadequate for assessing PA pressure. Left Atrium: Left atrial size was severely dilated. Right Atrium: Right atrial size was mildly dilated. Pericardium: There is no evidence of pericardial effusion. Mitral Valve: The mitral valve is degenerative in appearance. There is moderate calcification of the mitral valve leaflet(s). Moderate mitral annular calcification. Mild mitral valve regurgitation. Mild mitral valve stenosis. MV peak gradient, 9.2 mmHg. The mean mitral valve gradient is 5.0 mmHg with average heart rate of 78 bpm. Tricuspid Valve: The tricuspid valve is normal in structure. Tricuspid valve regurgitation is trivial. Aortic Valve: The aortic valve is tricuspid. Aortic valve regurgitation is not visualized. Aortic valve sclerosis is present, with no evidence of aortic valve stenosis. Pulmonic Valve: The pulmonic valve was grossly normal. Pulmonic valve regurgitation is trivial. Aorta: The aortic root and ascending aorta are structurally normal, with no evidence of dilitation. Venous: The inferior vena cava is normal in size with greater than 50% respiratory variability, suggesting right atrial pressure of 3 mmHg. IAS/Shunts: No atrial level shunt detected by color flow Doppler.  LEFT VENTRICLE PLAX 2D LVIDd:         3.60 cm   Diastology LVIDs:         2.80 cm   LV e' medial:    4.56 cm/s LV PW:         1.00 cm   LV E/e' medial:  34.2 LV IVS:        1.20 cm   LV e' lateral:   6.81 cm/s LVOT diam:     2.20 cm   LV E/e' lateral: 22.9 LV SV:         78 LV SV Index:   55 LVOT Area:     3.80 cm LV IVRT:       70 msec   RIGHT VENTRICLE RV S prime:     6.36 cm/s TAPSE (M-mode): 1.6 cm LEFT ATRIUM             Index        RIGHT ATRIUM           Index LA diam:        4.00 cm 2.84 cm/m   RA Area:     18.70 cm LA Vol (A2C):   57.9 ml 41.18 ml/m  RA Volume:   53.30 ml  37.91 ml/m LA Vol (A4C):   90.7 ml 64.51 ml/m LA Biplane Vol: 73.5 ml 52.28 ml/m  AORTIC VALVE LVOT Vmax:  105.00 cm/s LVOT Vmean:  65.500 cm/s LVOT VTI:    0.204 m  AORTA Ao Root diam: 2.60 cm Ao Asc diam:  3.50 cm MITRAL VALVE MV Area (PHT): 3.31 cm     SHUNTS MV Area VTI:   2.03 cm     Systemic VTI:  0.20 m MV Peak grad:  9.2 mmHg     Systemic Diam: 2.20 cm MV Mean grad:  5.0 mmHg MV Vmax:       1.52 m/s MV Vmean:      103.2 cm/s MV Decel Time: 229 msec MV E velocity: 156.00 cm/s MV A velocity: 141.00 cm/s MV E/A ratio:  1.11 Morene Brownie Electronically signed by Morene Brownie Signature Date/Time: 09/18/2024/4:39:26 PM    Final    NM Hepatobiliary Liver Func Result Date: 09/18/2024 CLINICAL DATA:  Abdominal pain, acute, nonlocalized EXAM: NUCLEAR MEDICINE HEPATOBILIARY IMAGING TECHNIQUE: Sequential images of the abdomen were obtained out to 60 minutes following intravenous administration of radiopharmaceutical. RADIOPHARMACEUTICALS:  4.9 mCi Tc-64m  Choletec IV COMPARISON:  Right upper quadrant abdominal ultrasound 09/16/2024. Abdominopelvic CT 09/01/2024. FINDINGS: Prompt uptake and biliary excretion of activity by the liver is seen. Gallbladder activity is visualized, consistent with patency of cystic duct. Biliary activity passes into small bowel, consistent with patent common bile duct. No evidence of bile leak. IMPRESSION: Normal hepatobiliary scan. No evidence of cholecystitis, biliary obstruction or bile leak. Electronically Signed   By: Elsie Perone M.D.   On: 09/18/2024 14:50   VAS US  CAROTID Result Date: 09/18/2024 Carotid Arterial Duplex Study [name]  Date of Exam:   09/18/2024 Medical Rec #: [id number]        Accession #:    [id number] Date of Birth: 11/08/1951        Patient Gender: F Patient Age:   68 years Exam Location:  [facility] Procedure:      VAS US  CAROTID Referring Phys: ARY CUMMINS --------------------------------------------------------------------------------  Indications:       CVA, Carotid artery disease and Altered mental status. Risk Factors:      Hypertension, hyperlipidemia, past history of smoking, PAD. Other Factors:     CKD IIIa. Limitations        Today's exam was limited due to heavy calcification and the                    resulting shadowing and Tortuous vessels. Comparison Study:  Prior carotid duplex study done 12/13/23 indicating 1-39% ICA                    stenosis with severely tortuous vessels, and 11/25/22                    indicating 40-59% ICA stenosis with turbulent vessels. Performing Technologist: Alberta Lis RVS  Examination Guidelines: A complete evaluation includes B-mode imaging, spectral Doppler, color Doppler, and power Doppler as needed of all accessible portions of each vessel. Bilateral testing is considered an integral part of a complete examination. Limited examinations for reoccurring indications may be performed as noted.  Right Carotid Findings: +----------+--------+--------+--------+------------------+--------+           PSV cm/sEDV cm/sStenosisPlaque DescriptionComments +----------+--------+--------+--------+------------------+--------+ CCA Prox  126     17              heterogenous               +----------+--------+--------+--------+------------------+--------+ CCA Mid   76      19                                         +----------+--------+--------+--------+------------------+--------+  CCA Distal74      22              calcific                   +----------+--------+--------+--------+------------------+--------+ ICA Prox  235     62      60-79%  calcific          tortuous  +----------+--------+--------+--------+------------------+--------+ ICA Mid   115     33                                tortuous +----------+--------+--------+--------+------------------+--------+ ICA Distal73      20                                tortuous +----------+--------+--------+--------+------------------+--------+ ECA       85      12                                tortuous +----------+--------+--------+--------+------------------+--------+ +----------+--------+-------+---------+-------------------+           PSV cm/sEDV cmsDescribe Arm Pressure (mmHG) +----------+--------+-------+---------+-------------------+ Subclavian224            Turbulent                    +----------+--------+-------+---------+-------------------+ +---------+--------+--+--------+--+ VertebralPSV cm/s74EDV cm/s17 +---------+--------+--+--------+--+  Left Carotid Findings: +----------+--------+--------+--------+------------------+--------+           PSV cm/sEDV cm/sStenosisPlaque DescriptionComments +----------+--------+--------+--------+------------------+--------+ CCA Prox  93      13              calcific                   +----------+--------+--------+--------+------------------+--------+ CCA Distal108     19              heterogenous               +----------+--------+--------+--------+------------------+--------+ ICA Prox  401     105     80-99%  calcific          tortuous +----------+--------+--------+--------+------------------+--------+ ICA Mid   91      26                                tortuous +----------+--------+--------+--------+------------------+--------+ ICA Distal88      26                                tortuous +----------+--------+--------+--------+------------------+--------+ ECA       88      10                                tortuous +----------+--------+--------+--------+------------------+--------+  +----------+--------+--------+---------+-------------------+           PSV cm/sEDV cm/sDescribe Arm Pressure (mmHG) +----------+--------+--------+---------+-------------------+ Subclavian213             Turbulent                    +----------+--------+--------+---------+-------------------+ +---------+--------+--+--------+--+ VertebralPSV cm/s53EDV cm/s17 +---------+--------+--+--------+--+   Summary: Right Carotid: Velocities in the right ICA are consistent with a 60-79%  stenosis. Left Carotid: Velocities in the left ICA are consistent with a 80-99% stenosis. Vertebrals:  Bilateral vertebral arteries demonstrate antegrade flow. Subclavians: Bilateral subclavian artery flow was disturbed. *See table(s) above for measurements and observations.  Suggest Peripheral Vascular Consult.    Preliminary      PERTINENT LAB RESULTS: CBC: Recent Labs    09/19/24 0240 09/20/24 0250  WBC 32.0* 24.0*  HGB 7.9* 7.8*  HCT 25.7* 26.0*  PLT 485* 352   CMET CMP     Component Value Date/Time   NA 129 (L) 09/19/2024 0240   NA 138 01/12/2024 1422   K 4.4 09/19/2024 0240   CL 101 09/19/2024 0240   CO2 19 (L) 09/19/2024 0240   GLUCOSE 82 09/19/2024 0240   BUN 56 (H) 09/19/2024 0240   BUN 28 (H) 01/12/2024 1422   CREATININE 1.79 (H) 09/19/2024 0240   CREATININE 1.44 (H) 08/03/2024 1024   CREATININE 1.33 (H) 03/03/2016 1531   CALCIUM  7.0 (L) 09/19/2024 0240   PROT 4.3 (L) 09/19/2024 0240   PROT 6.1 06/23/2023 1205   ALBUMIN  2.1 (L) 09/19/2024 0240   ALBUMIN  3.7 (L) 06/23/2023 1205   AST 89 (H) 09/19/2024 0240   AST 24 08/03/2024 1024   ALT 116 (H) 09/19/2024 0240   ALT 15 08/03/2024 1024   ALKPHOS 58 09/19/2024 0240   BILITOT 0.6 09/19/2024 0240   BILITOT 0.4 08/03/2024 1024   EGFR 31 (L) 01/12/2024 1422   GFRNONAA 30 (L) 09/19/2024 0240   GFRNONAA 39 (L) 08/03/2024 1024    GFR Estimated Creatinine Clearance: 18.4 mL/min (A) (by C-G formula based on SCr of 1.79  mg/dL (H)). No results for input(s): LIPASE, AMYLASE in the last 72 hours. No results for input(s): CKTOTAL, CKMB, CKMBINDEX, TROPONINI in the last 72 hours. Invalid input(s): POCBNP No results for input(s): DDIMER in the last 72 hours. No results for input(s): HGBA1C in the last 72 hours. Recent Labs    09/17/24 1756  LDLDIRECT 21   No results for input(s): TSH, T4TOTAL, T3FREE, THYROIDAB in the last 72 hours.  Invalid input(s): FREET3 Recent Labs    09/17/24 1755  VITAMINB12 889  FOLATE 12.3  FERRITIN 20  TIBC 430  IRON <10*  RETICCTPCT 1.4   Coags: No results for input(s): INR in the last 72 hours.  Invalid input(s): PT Microbiology: Recent Results (from the past 240 hours)  Urine Culture (for pregnant, neutropenic or urologic patients or patients with an indwelling urinary catheter)     Status: None   Collection Time: 09/16/24  7:45 PM   Specimen: Urine, Clean Catch  Result Value Ref Range Status   Specimen Description URINE, CLEAN CATCH  Final   Special Requests Immunocompromised  Final   Culture   Final    NO GROWTH Performed at [facility], [address], [place], [place] [address]    Report Status 09/18/2024 FINAL  Final  Culture, blood (Routine X 2) w Reflex to ID Panel     Status: None (Preliminary result)   Collection Time: 09/17/24  4:58 AM   Specimen: BLOOD LEFT ARM  Result Value Ref Range Status   Specimen Description BLOOD LEFT ARM  Final   Special Requests   Final    BOTTLES DRAWN AEROBIC AND ANAEROBIC Blood Culture adequate volume   Culture   Final    NO GROWTH 3 DAYS Performed at [facility], [address], [place], [place] [address]    Report Status PENDING  Incomplete  Culture, blood (Routine X 2) w Reflex to ID Panel     Status: None (Preliminary result)   Collection Time: 09/17/24  5:02 AM   Specimen: BLOOD LEFT HAND  Result Value Ref Range Status   Specimen Description BLOOD LEFT HAND   Final   Special Requests   Final    BOTTLES DRAWN AEROBIC ONLY Blood Culture adequate volume   Culture   Final    NO GROWTH 3 DAYS Performed at [facility], [address], [place], [place] [address]    Report Status PENDING  Incomplete    FURTHER DISCHARGE INSTRUCTIONS:  Get Medicines reviewed and adjusted: Please take all your medications with you for your next visit with your Primary MD  Laboratory/radiological data: Please request your Primary MD to go over all hospital tests and procedure/radiological results at the follow up, please ask your Primary MD to get all Hospital records sent to his/her office.  In some cases, they will be blood work, cultures and biopsy results pending at the time of your discharge. Please request that your primary care M.D. goes through all the records of your hospital data and follows up on these results.  Also Note the following: If you experience worsening of your admission symptoms, develop shortness of breath, life threatening emergency, suicidal or homicidal thoughts you must seek medical attention immediately by calling 911 or calling your MD immediately  if symptoms less severe.  You must read complete instructions/literature along with all the possible adverse reactions/side effects for all the Medicines you take and that have been prescribed to you. Take any new Medicines after you have completely understood and accpet all the possible adverse reactions/side effects.   Do not drive when taking Pain medications or sleeping medications (Benzodaizepines)  Do not take more than prescribed Pain, Sleep and Anxiety Medications. It is not advisable to combine anxiety,sleep and pain medications without talking with your primary care practitioner  Special Instructions: If you have smoked or chewed Tobacco  in the last 2 yrs please stop smoking, stop any regular Alcohol   and or any Recreational drug use.  Wear Seat belts while driving.  Please  note: You were cared for by a hospitalist during your hospital stay. Once you are discharged, your primary care physician will handle any further medical issues. Please note that NO REFILLS for any discharge medications will be authorized once you are discharged, as it is imperative that you return to your primary care physician (or establish a relationship with a primary care physician if you do not have one) for your post hospital discharge needs so that they can reassess your need for medications and monitor your lab values.  Total Time spent coordinating discharge including counseling, education and face to face time equals greater than 30 minutes.  SignedBETHA Donalda Applebaum 09/20/2024 8:46 AM

## 2024-09-20 NOTE — Progress Notes (Signed)
 SLP Cancellation Note  Patient Details Name: [name] MRN: [id number] DOB: 06/11/1951   Cancelled treatment:       Reason Eval/Treat Not Completed: Pt reported speech and language functions are back at baseline and she is not exhibiting comprehension or verbal expression concerns at this time. She is Ox4. Pt politely declined a formal speech/language assessment at this time. D/C anticipated today. SLP provided education on how to access SLP services outside of hospital if her needs change. SLP will sign off.    Colbert Curenton J Miner Koral 09/20/2024, 11:25 AM

## 2024-09-21 ENCOUNTER — Telehealth: Payer: Self-pay | Admitting: Hematology

## 2024-09-21 ENCOUNTER — Ambulatory Visit: Payer: Self-pay | Admitting: Family Medicine

## 2024-09-21 DIAGNOSIS — I6523 Occlusion and stenosis of bilateral carotid arteries: Secondary | ICD-10-CM

## 2024-09-21 NOTE — Telephone Encounter (Signed)
 [name] has been contacted and made aware of her follow up appointment scheduled on 10/16.

## 2024-09-22 ENCOUNTER — Encounter: Payer: Self-pay | Admitting: Family Medicine

## 2024-09-22 ENCOUNTER — Ambulatory Visit: Admitting: Family Medicine

## 2024-09-22 VITALS — BP 133/77 | HR 66 | Temp 97.6°F | Ht 64.0 in | Wt 92.0 lb

## 2024-09-22 DIAGNOSIS — D649 Anemia, unspecified: Secondary | ICD-10-CM

## 2024-09-22 DIAGNOSIS — Z8673 Personal history of transient ischemic attack (TIA), and cerebral infarction without residual deficits: Secondary | ICD-10-CM

## 2024-09-22 DIAGNOSIS — D45 Polycythemia vera: Secondary | ICD-10-CM | POA: Diagnosis not present

## 2024-09-22 DIAGNOSIS — N1832 Chronic kidney disease, stage 3b: Secondary | ICD-10-CM | POA: Diagnosis not present

## 2024-09-22 DIAGNOSIS — J9611 Chronic respiratory failure with hypoxia: Secondary | ICD-10-CM

## 2024-09-22 DIAGNOSIS — D72829 Elevated white blood cell count, unspecified: Secondary | ICD-10-CM | POA: Diagnosis not present

## 2024-09-22 LAB — CULTURE, BLOOD (ROUTINE X 2)
Culture: NO GROWTH
Culture: NO GROWTH
Special Requests: ADEQUATE
Special Requests: ADEQUATE

## 2024-09-22 LAB — METHYLMALONIC ACID, SERUM: Methylmalonic Acid, Quantitative: 436 nmol/L — ABNORMAL HIGH (ref 0–378)

## 2024-09-22 NOTE — Progress Notes (Addendum)
 BP 133/77   Pulse 66   Temp 97.6 F (36.4 C) (Temporal)   Ht 5' 4 (1.626 m)   Wt 92 lb (41.7 kg)   SpO2 94%   BMI 15.79 kg/m    Subjective:   Patient ID: [name], female    DOB: Jan 13, 1951, 73 y.o.   MRN: [id number]  HPI: [name] is a 73 y.o. female who presenting on 09/22/2024 for Hospitalization Follow-up   Discussed the use of AI scribe software for clinical note transcription with the patient, who gave verbal consent to proceed.  History of Present Illness   [name] is a 73 year old female with a history of CVA, paroxysmal AFib, and polycythemia vera who presents for hospital follow-up after recent admission for hematuria, symptomatic anemia, and acute blood loss anemia.  Hematuria and acute blood loss anemia - Hematuria present for 1-2 weeks prior to admission, with clots described as 'about the size of my fist' - Hematuria resolved prior to hospital admission - Hospitalized from September 16, 2024 to September 20, 2024 for hematuria, symptomatic anemia, and acute blood loss anemia - Received blood transfusions during hospitalization, with hemoglobin reportedly as low as four  Neurological symptoms and cerebrovascular accident - Experienced acute stroke symptoms during hospitalization, including confusion, inability to recognize family members, and difficulty speaking - Confusion has improved and returned to baseline, but speech remains slightly slurred - History of previous stroke in February affecting right hand  Renal dysfunction and electrolyte abnormalities - Acute on chronic kidney disease stage 3B diagnosed during hospitalization - Hypomagnesemia present during hospital stay - Farxiga  discontinued during hospitalization, previously used for kidney protection  Urinary symptoms and catheterization - No current urinary symptoms such as burning or pain - Treated twice with antibiotics for suspected urinary tract infection, but urine cultures  negative - Uses a diaper for urination - Requires in-and-out catheterization for urine sample collection  Oxygen dependence and respiratory status - Uses home oxygen at two liters, with a large tank that is difficult to transport - Oxygen saturation typically 89-90% - Supposed to use oxygen constantly          Relevant past medical, surgical, family and social history reviewed and updated as indicated. Interim medical history since our last visit reviewed. Allergies and medications reviewed and updated.  Review of Systems  Constitutional:  Negative for chills and fever.  Eyes:  Negative for visual disturbance.  Respiratory:  Positive for shortness of breath. Negative for chest tightness.   Cardiovascular:  Negative for chest pain and leg swelling.  Gastrointestinal:  Negative for abdominal pain.  Genitourinary:  Negative for difficulty urinating, dysuria, hematuria and urgency.  Skin:  Negative for rash.  Neurological:  Negative for light-headedness and headaches.  Psychiatric/Behavioral:  Positive for confusion. Negative for agitation, behavioral problems, decreased concentration and dysphoric mood. The patient is not nervous/anxious.   All other systems reviewed and are negative.   Per HPI unless specifically indicated above   Allergies as of 09/22/2024       Reactions   Atenolol Rash, Itching   Demerol   [meperidine  Hcl] Itching   Demerol  [meperidine ] Itching, Other (See Comments)   Unknown, can't remember    Gabapentin  Anxiety, Other (See Comments)   SI and HI   Inderal [propranolol] Other (See Comments), Itching   Doesn't remember    Prednisone Other (See Comments)   Muscle spasms   Statins Itching, Other (See Comments)   Simvastatin  - caused severe itching  Pravastatin  - caused lesser tiching One type of statin? Caused stroke in 2006, and other symptoms as result   Warfarin And Related Other (See Comments)   Caused nose bleeds   Crestor [rosuvastatin] Itching,  Rash   Docosahexaenoic Acid (dha) (fish)    nosebleeds   Lactose Intolerance (gi) Other (See Comments)   Bloating, gas   Lipitor [atorvastatin ] Rash   Lovaza [omega-3-acid Ethyl Esters (fish)] Other (See Comments)   nosebleeds   Penicillins Hives, Rash, Other (See Comments)   Immediate rash, facial/tongue/throat swelling, SOB or lightheadedness with hypotension Questionable high fever    Pravastatin  Itching, Rash   Simvastatin  Itching, Rash, Other (See Comments)   Trilipix [choline Fenofibrate ] Other (See Comments)   Doesn't remember    Warfarin Other (See Comments)   nosebleeds   Hydralazine  Swelling   Effexor [venlafaxine Hcl] Other (See Comments)   Doesn't remember    Nexium [esomeprazole Magnesium ] Rash   Percocet [oxycodone -acetaminophen ] Other (See Comments)   Doesn't remember    Plavix  [clopidogrel  Bisulfate] Rash        Medication List        Accurate as of September 22, 2024  9:51 AM. If you have any questions, ask your nurse or doctor.          alendronate  70 MG tablet Commonly known as: FOSAMAX  TAKE ONE TABLET ONCE WEEKLY   apixaban  2.5 MG Tabs tablet Commonly known as: ELIQUIS  Take 1 tablet (2.5 mg total) by mouth 2 (two) times daily.   calcitRIOL 0.25 MCG capsule Commonly known as: ROCALTROL Take 0.25 mcg by mouth 2 (two) times a week.   colchicine  0.6 MG tablet TAKE ONE TABLET ONCE DAILY   diclofenac  Sodium 1 % Gel Commonly known as: VOLTAREN  Apply 2 g topically 4 (four) times daily as needed (pain).   diltiazem  240 MG 24 hr capsule Commonly known as: CARDIZEM  CD Take 240 mg by mouth daily.   escitalopram  20 MG tablet Commonly known as: LEXAPRO  TAKE ONE TABLET ONCE DAILY   ezetimibe  10 MG tablet Commonly known as: ZETIA  Take 1 tablet (10 mg total) by mouth daily.   fenofibrate  145 MG tablet Commonly known as: TRICOR  Take 1 tablet (145 mg total) by mouth daily.   ferrous sulfate 325 (65 FE) MG EC tablet Take 1 tablet (325 mg total)  by mouth 2 (two) times daily.   flecainide  50 MG tablet Commonly known as: TAMBOCOR  Take 50 mg by mouth 2 (two) times daily.   furosemide  40 MG tablet Commonly known as: LASIX  Take 1 tablet (40 mg total) by mouth daily as needed for fluid or edema.   pantoprazole  40 MG tablet Commonly known as: PROTONIX  TAKE ONE TABLET ONCE DAILY   sodium bicarbonate  650 MG tablet Take 1 tablet (650 mg total) by mouth 2 (two) times daily.   zinc  oxide 20 % ointment Apply 1 Application topically as needed for irritation.         Objective:   BP 133/77   Pulse 66   Temp 97.6 F (36.4 C) (Temporal)   Ht 5' 4 (1.626 m)   Wt 92 lb (41.7 kg)   SpO2 94%   BMI 15.79 kg/m   Wt Readings from Last 3 Encounters:  09/22/24 92 lb (41.7 kg)  09/16/24 91 lb 14.9 oz (41.7 kg)  09/13/24 92 lb (41.7 kg)    Physical Exam Vitals and nursing note reviewed.  Constitutional:      Appearance: Normal appearance.     Comments: Right  BKA, right sided arm weakness 1 out of 5  Cardiovascular:     Rate and Rhythm: Normal rate and regular rhythm.  Pulmonary:     Effort: Pulmonary effort is normal. No respiratory distress.     Breath sounds: Normal breath sounds. No wheezing or rhonchi.  Neurological:     Mental Status: She is alert.                Assessment & Plan:   Problem List Items Addressed This Visit       Respiratory   Chronic hypoxic respiratory failure (HCC)     Other   Leukocytosis   Relevant Orders   CBC with Differential/Platelet   CMP14+EGFR   Magnesium    Polycythemia vera (HCC)   Relevant Orders   CBC with Differential/Platelet   CMP14+EGFR   Magnesium    Symptomatic anemia - Primary   Relevant Orders   CBC with Differential/Platelet   CMP14+EGFR   Magnesium    Other Visit Diagnoses       Stage 3b chronic kidney disease (HCC)       Relevant Orders   CBC with Differential/Platelet   CMP14+EGFR   Magnesium      History of CVA (cerebrovascular accident)        Relevant Orders   CBC with Differential/Platelet   CMP14+EGFR   Magnesium          Recent cerebrovascular accident with residual speech impairment Recent CVA with residual speech impairment. Speech improved but remains slightly slurred. Neurology follow-up pending. - Contact stroke center to confirm neurology follow-up appointment.  Anemia, acute on chronic Acute on chronic anemia with recent hospitalization for symptomatic anemia and acute blood loss. Hemoglobin critically low, required transfusion. - Order complete blood count to assess current anemia status. - Monitor for signs of recurrent bleeding or anemia.  Chronic kidney disease, stage 3b Chronic kidney disease stage 3b with recent acute worsening, likely due to anemia and dehydration. Farxiga  discontinued due to low blood pressure. - Order renal function tests to assess current kidney status. - Restart Farxiga  if renal function and blood pressure are stable. - Monitor blood pressure at home; if systolic drops below 100, consider holding Farxiga .  Oxygen dependence (chronic hypoxemia) Chronic hypoxemia requiring continuous oxygen therapy. Current setup cumbersome. Need for portable oxygen system discussed. Insurance requires documentation of desaturation below 88% for approval. - Perform exertion test to document oxygen desaturation. - If desaturation to 88% or below, order portable oxygen system.     Sitting went down to 86% on room air and back up to 100% on 2 L nasal cannula. Patient had a right BKA and right sided weakness today.  Her O2 level while sitting on room air went down to 86%.  Placed on 2 L nasal cannula oxygen while sitting and it went up to 100%.  She was unable to ambulate because of her right-sided weakness. Will do order for portable oxygen concentrator     Follow up plan: Return if symptoms worsen or fail to improve, for Approximately 1 month follow-up.  Counseling provided for all of the vaccine  components Orders Placed This Encounter  Procedures   CBC with Differential/Platelet   CMP14+EGFR   Magnesium     Fonda Levins, MD [facility] 09/22/2024, 9:51 AM

## 2024-09-23 LAB — CMP14+EGFR
ALT: 54 IU/L — ABNORMAL HIGH (ref 0–32)
AST: 33 IU/L (ref 0–40)
Albumin: 3 g/dL — ABNORMAL LOW (ref 3.8–4.8)
Alkaline Phosphatase: 84 IU/L (ref 49–135)
BUN/Creatinine Ratio: 33 — ABNORMAL HIGH (ref 12–28)
BUN: 38 mg/dL — ABNORMAL HIGH (ref 8–27)
Bilirubin Total: 0.3 mg/dL (ref 0.0–1.2)
CO2: 20 mmol/L (ref 20–29)
Calcium: 7.5 mg/dL — ABNORMAL LOW (ref 8.7–10.3)
Chloride: 103 mmol/L (ref 96–106)
Creatinine, Ser: 1.15 mg/dL — ABNORMAL HIGH (ref 0.57–1.00)
Globulin, Total: 1.6 g/dL (ref 1.5–4.5)
Glucose: 78 mg/dL (ref 70–99)
Potassium: 4.9 mmol/L (ref 3.5–5.2)
Sodium: 137 mmol/L (ref 134–144)
Total Protein: 4.6 g/dL — ABNORMAL LOW (ref 6.0–8.5)
eGFR: 50 mL/min/1.73 — ABNORMAL LOW (ref >59)

## 2024-09-23 LAB — CBC WITH DIFFERENTIAL/PLATELET
Basophils Absolute: 0.2 x10E3/uL (ref 0.0–0.2)
Basos: 1 %
EOS (ABSOLUTE): 0.5 x10E3/uL — ABNORMAL HIGH (ref 0.0–0.4)
Eos: 2 %
Hematocrit: 29.3 % — ABNORMAL LOW (ref 34.0–46.6)
Hemoglobin: 8.5 g/dL — CL (ref 11.1–15.9)
Immature Grans (Abs): 0.3 x10E3/uL — ABNORMAL HIGH (ref 0.0–0.1)
Immature Granulocytes: 1 %
Lymphocytes Absolute: 0.9 x10E3/uL (ref 0.7–3.1)
Lymphs: 3 %
MCH: 23.3 pg — ABNORMAL LOW (ref 26.6–33.0)
MCHC: 29 g/dL — ABNORMAL LOW (ref 31.5–35.7)
MCV: 80 fL (ref 79–97)
Monocytes Absolute: 0.3 x10E3/uL (ref 0.1–0.9)
Monocytes: 1 %
Neutrophils Absolute: 24.2 x10E3/uL — ABNORMAL HIGH (ref 1.4–7.0)
Neutrophils: 92 %
Platelets: 316 x10E3/uL (ref 150–450)
RBC: 3.65 x10E6/uL — ABNORMAL LOW (ref 3.77–5.28)
RDW: 24.2 % — ABNORMAL HIGH (ref 11.7–15.4)
WBC: 26.4 x10E3/uL (ref 3.4–10.8)

## 2024-09-23 LAB — MAGNESIUM: Magnesium: 2.1 mg/dL (ref 1.6–2.3)

## 2024-09-25 ENCOUNTER — Ambulatory Visit: Payer: Self-pay | Admitting: Family Medicine

## 2024-09-25 NOTE — Telephone Encounter (Signed)
 Patient aware and verbalized understanding. CBC routed to Dr Lanny.

## 2024-09-26 ENCOUNTER — Encounter: Payer: Self-pay | Admitting: Pulmonary Disease

## 2024-09-26 ENCOUNTER — Ambulatory Visit: Admitting: Pulmonary Disease

## 2024-09-26 ENCOUNTER — Ambulatory Visit (INDEPENDENT_AMBULATORY_CARE_PROVIDER_SITE_OTHER): Admitting: Pulmonary Disease

## 2024-09-26 VITALS — BP 123/78 | HR 98 | Temp 97.8°F | Ht 64.0 in | Wt 92.0 lb

## 2024-09-26 DIAGNOSIS — R0609 Other forms of dyspnea: Secondary | ICD-10-CM

## 2024-09-26 MED ORDER — BUDESONIDE 0.25 MG/2ML IN SUSP: 0.2500 mg | Freq: Two times a day (BID) | RESPIRATORY_TRACT | 12 refills | Status: DC

## 2024-09-26 MED ORDER — ARFORMOTEROL TARTRATE 15 MCG/2ML IN NEBU: 15.0000 ug | INHALATION_SOLUTION | Freq: Two times a day (BID) | RESPIRATORY_TRACT | 6 refills | Status: DC

## 2024-09-26 NOTE — Patient Instructions (Signed)
 Nice to meet you  Use arformoterol and budesonide twice a day, these are nebulized medicines  Lets see if these breathing treatments open up your lungs and help you breathe easier  Order pulmonary function test for further evaluation and to see if we can arrive a different diagnosis  I think your low oxygen is likely related to fluid buildup over time.  There was some fluid on your lungs in February.  This is likely related to the heart that seems a bit weaker on most recent echocardiogram.  It is possible the heart doctors could make changes to medication and this could improve but I am not sure  I think your shortness of breath is related to the fluid buildup, damage albeit mild from cigarette smoking over time, weakness after the stroke, recent severe anemia.  Lets see if we can improve some of these things.  Return to clinic in 2 months same day as pulmonary function test with Dr. Annella

## 2024-09-27 NOTE — Progress Notes (Signed)
 @Patient  ID: [name], female    DOB: 21-Mar-1951, 73 y.o.   MRN: [id number]  No chief complaint on file.   Referring provider: Cathlene Marry Lenis*  HPI:   73 y.o. woman with a history of CVA, P vera, congestive heart failure with decompensation presents for evaluation of dyspnea on exertion.  She complains dyspnea exertion.  Multiple notes from PCP office reviewed.  Most recent cardiology note reviewed.  Note from hospitalization 08/2024 reviewed.  Notes from hospitalization 09/2024 reviewed.  Patient is short of breath.  Okay at rest but really with exertion.  She notes over time she is less active.  This is related largely to stroke in the past.  In addition, she was hospitalized 08/2024 with UTI.  3 days in the hospital.  Also, hospitalized 09/2024 with severe anemia.  Short of breath worse with inclines or stairs.  No position makes it better or worse.  No times a day to make things better or worse.  No seasonal or environmental factors she can identify to make it better or worse.  No true alleviating or other exacerbating factors.  She has a history of smoking, 45-pack-year.  Never had PFTs.  Most recent cross-sectional imaging lung CT 01/2024 revealed bilateral pleural effusions and dependent ground glass opacities, mild emphysema, with the exception emphysema findings most consistent with cardiogenic volume overload normal my interpretation.  We discussed at length her shortness of breath.  As a relates to congestive heart failure largely.  Also worsening recently with anemia certainly contributing.  Large component of deconditioning given her decreased activity over time after stroke but certainly even less active over the last couple months with decompensation and hospitalizations.  Also with emphysema on CT scan discussed smoking-related lung disease or possible development of asthma.  Discussed Trelegy for further evaluation and treatment to aid in her symptoms.  Questionaires /  Pulmonary Flowsheets:   ACT:      No data to display          MMRC:     No data to display          Epworth:      No data to display          Tests:   FENO:  No results found for: NITRICOXIDE  PFT:     No data to display          WALK:      No data to display          Imaging: Personally reviewed and as per EMR in discussion in this note VAS US  CAROTID Result Date: 09/21/2024 Carotid Arterial Duplex Study [name]  Date of Exam:   09/18/2024 Medical Rec #: [id number]       Accession #:    [id number] Date of Birth: April 13, 1951        Patient Gender: F Patient Age:   86 years Exam Location:  [facility] Procedure:      VAS US  CAROTID Referring Phys: ARY CUMMINS --------------------------------------------------------------------------------  Indications:       CVA, Carotid artery disease and Altered mental status. Risk Factors:      Hypertension, hyperlipidemia, past history of smoking, PAD. Other Factors:     CKD IIIa. Limitations        Today's exam was limited due to heavy calcification and the                    resulting shadowing  and Tortuous vessels. Comparison Study:  Prior carotid duplex study done 12/13/23 indicating 1-39% ICA                    stenosis with severely tortuous vessels, and 11/25/22                    indicating 40-59% ICA stenosis with turbulent vessels. Performing Technologist: Alberta Lis RVS  Examination Guidelines: A complete evaluation includes B-mode imaging, spectral Doppler, color Doppler, and power Doppler as needed of all accessible portions of each vessel. Bilateral testing is considered an integral part of a complete examination. Limited examinations for reoccurring indications may be performed as noted.  Right Carotid Findings: +----------+--------+--------+--------+------------------+--------+           PSV cm/sEDV cm/sStenosisPlaque DescriptionComments  +----------+--------+--------+--------+------------------+--------+ CCA Prox  126     17              heterogenous               +----------+--------+--------+--------+------------------+--------+ CCA Mid   76      19                                         +----------+--------+--------+--------+------------------+--------+ CCA Distal74      22              calcific                   +----------+--------+--------+--------+------------------+--------+ ICA Prox  235     62      60-79%  calcific          tortuous +----------+--------+--------+--------+------------------+--------+ ICA Mid   115     33                                tortuous +----------+--------+--------+--------+------------------+--------+ ICA Distal73      20                                tortuous +----------+--------+--------+--------+------------------+--------+ ECA       85      12                                tortuous +----------+--------+--------+--------+------------------+--------+ +----------+--------+-------+---------+-------------------+           PSV cm/sEDV cmsDescribe Arm Pressure (mmHG) +----------+--------+-------+---------+-------------------+ [id number]            Turbulent                    +----------+--------+-------+---------+-------------------+ +---------+--------+--+--------+--+ VertebralPSV cm/s74EDV cm/s17 +---------+--------+--+--------+--+  Left Carotid Findings: +----------+--------+--------+--------+------------------+--------+           PSV cm/sEDV cm/sStenosisPlaque DescriptionComments +----------+--------+--------+--------+------------------+--------+ CCA Prox  93      13              calcific                   +----------+--------+--------+--------+------------------+--------+ CCA Distal108     19              heterogenous               +----------+--------+--------+--------+------------------+--------+ ICA Prox  401      105     80-99%  calcific          tortuous +----------+--------+--------+--------+------------------+--------+ ICA Mid   91      26                                tortuous +----------+--------+--------+--------+------------------+--------+ ICA Distal88      26                                tortuous +----------+--------+--------+--------+------------------+--------+ ECA       88      10                                tortuous +----------+--------+--------+--------+------------------+--------+ +----------+--------+--------+---------+-------------------+           PSV cm/sEDV cm/sDescribe Arm Pressure (mmHG) +----------+--------+--------+---------+-------------------+ Subclavian213             Turbulent                    +----------+--------+--------+---------+-------------------+ +---------+--------+--+--------+--+ VertebralPSV cm/s53EDV cm/s17 +---------+--------+--+--------+--+   Summary: Right Carotid: Velocities in the right ICA are consistent with a 60-79%                stenosis. Left Carotid: Velocities in the left ICA are consistent with a 80-99% stenosis.               Significant worsening from CUS 11/25/22. Vertebrals:  Bilateral vertebral arteries demonstrate antegrade flow. Subclavians: Bilateral subclavian artery flow was disturbed. *See table(s) above for measurements and observations.  Suggest Peripheral Vascular Consult. Electronically signed by Eather Popp MD on 09/21/2024 at 8:24:21 AM.    Final    ECHOCARDIOGRAM COMPLETE Result Date: 09/18/2024    ECHOCARDIOGRAM REPORT   Patient Name:   [name] Date of Exam: 09/18/2024 Medical Rec #:  [id number]      Height:       64.0 in Accession #:    [id number]     Weight:       91.9 lb Date of Birth:  October 11, 1951       BSA:          1.406 m Patient Age:    73 years       BP:           123/57 mmHg Patient Gender: F              HR:           77 bpm. Exam Location:  Inpatient Procedure: 2D Echo, Cardiac Doppler  and Color Doppler (Both Spectral and Color            Flow Doppler were utilized during procedure). Indications:    Stroke i63.9  History:        Patient has prior history of Echocardiogram examinations, most                 recent 01/30/2024. CAD, CKD, Arrythmias:Atrial Fibrillation; Risk                 Factors:Hypertension and Dyslipidemia.  Sonographer:    Damien Senior RDCS Referring Phys: [id number] ARY CUMMINS  Sonographer Comments: Technically difficult due to thin body habitus IMPRESSIONS  1. Left ventricular ejection fraction, by estimation, is 45 to 50%. The left ventricle has mildly decreased function. The left ventricle demonstrates regional wall motion abnormalities (see scoring  diagram/findings for description). There is mild asymmetric left ventricular hypertrophy of the septal segment. Left ventricular diastolic parameters are consistent with Grade II diastolic dysfunction (pseudonormalization).  2. Right ventricular systolic function is mildly reduced. The right ventricular size is mildly enlarged. Tricuspid regurgitation signal is inadequate for assessing PA pressure.  3. Left atrial size was severely dilated.  4. Right atrial size was mildly dilated.  5. The mitral valve is degenerative. Mild mitral valve regurgitation. Mild mitral stenosis. The mean mitral valve gradient is 5.0 mmHg with average heart rate of 78 bpm. Moderate mitral annular calcification.  6. The aortic valve is tricuspid. Aortic valve regurgitation is not visualized. Aortic valve sclerosis is present, with no evidence of aortic valve stenosis.  7. The inferior vena cava is normal in size with greater than 50% respiratory variability, suggesting right atrial pressure of 3 mmHg. FINDINGS  Left Ventricle: Left ventricular ejection fraction, by estimation, is 45 to 50%. The left ventricle has mildly decreased function. The left ventricle demonstrates regional wall motion abnormalities. Definity  contrast agent was given IV to delineate the  left ventricular endocardial borders. The left ventricular internal cavity size was normal in size. There is mild asymmetric left ventricular hypertrophy of the septal segment. Left ventricular diastolic function could not be evaluated due to mitral stenosis. Left ventricular diastolic parameters are consistent with Grade II diastolic dysfunction (pseudonormalization).  LV Wall Scoring: The mid and distal anterior septum is hypokinetic. Right Ventricle: The right ventricular size is mildly enlarged. No increase in right ventricular wall thickness. Right ventricular systolic function is mildly reduced. Tricuspid regurgitation signal is inadequate for assessing PA pressure. Left Atrium: Left atrial size was severely dilated. Right Atrium: Right atrial size was mildly dilated. Pericardium: There is no evidence of pericardial effusion. Mitral Valve: The mitral valve is degenerative in appearance. There is moderate calcification of the mitral valve leaflet(s). Moderate mitral annular calcification. Mild mitral valve regurgitation. Mild mitral valve stenosis. MV peak gradient, 9.2 mmHg. The mean mitral valve gradient is 5.0 mmHg with average heart rate of 78 bpm. Tricuspid Valve: The tricuspid valve is normal in structure. Tricuspid valve regurgitation is trivial. Aortic Valve: The aortic valve is tricuspid. Aortic valve regurgitation is not visualized. Aortic valve sclerosis is present, with no evidence of aortic valve stenosis. Pulmonic Valve: The pulmonic valve was grossly normal. Pulmonic valve regurgitation is trivial. Aorta: The aortic root and ascending aorta are structurally normal, with no evidence of dilitation. Venous: The inferior vena cava is normal in size with greater than 50% respiratory variability, suggesting right atrial pressure of 3 mmHg. IAS/Shunts: No atrial level shunt detected by color flow Doppler.  LEFT VENTRICLE PLAX 2D LVIDd:         3.60 cm   Diastology LVIDs:         2.80 cm   LV e' medial:     4.56 cm/s LV PW:         1.00 cm   LV E/e' medial:  34.2 LV IVS:        1.20 cm   LV e' lateral:   6.81 cm/s LVOT diam:     2.20 cm   LV E/e' lateral: 22.9 LV SV:         78 LV SV Index:   55 LVOT Area:     3.80 cm LV IVRT:       70 msec  RIGHT VENTRICLE RV S prime:     6.36 cm/s TAPSE (M-mode): 1.6 cm LEFT ATRIUM  Index        RIGHT ATRIUM           Index LA diam:        4.00 cm 2.84 cm/m   RA Area:     18.70 cm LA Vol (A2C):   57.9 ml 41.18 ml/m  RA Volume:   53.30 ml  37.91 ml/m LA Vol (A4C):   90.7 ml 64.51 ml/m LA Biplane Vol: 73.5 ml 52.28 ml/m  AORTIC VALVE LVOT Vmax:   105.00 cm/s LVOT Vmean:  65.500 cm/s LVOT VTI:    0.204 m  AORTA Ao Root diam: 2.60 cm Ao Asc diam:  3.50 cm MITRAL VALVE MV Area (PHT): 3.31 cm     SHUNTS MV Area VTI:   2.03 cm     Systemic VTI:  0.20 m MV Peak grad:  9.2 mmHg     Systemic Diam: 2.20 cm MV Mean grad:  5.0 mmHg MV Vmax:       1.52 m/s MV Vmean:      103.2 cm/s MV Decel Time: 229 msec MV E velocity: 156.00 cm/s MV A velocity: 141.00 cm/s MV E/A ratio:  1.11 [name] Electronically signed by [name] Signature Date/Time: 09/18/2024/4:39:26 PM    Final    NM Hepatobiliary Liver Func Result Date: 09/18/2024 CLINICAL DATA:  Abdominal pain, acute, nonlocalized EXAM: NUCLEAR MEDICINE HEPATOBILIARY IMAGING TECHNIQUE: Sequential images of the abdomen were obtained out to 60 minutes following intravenous administration of radiopharmaceutical. RADIOPHARMACEUTICALS:  4.9 mCi Tc-46m  Choletec  IV COMPARISON:  Right upper quadrant abdominal ultrasound 09/16/2024. Abdominopelvic CT 09/01/2024. FINDINGS: Prompt uptake and biliary excretion of activity by the liver is seen. Gallbladder activity is visualized, consistent with patency of cystic duct. Biliary activity passes into small bowel, consistent with patent common bile duct. No evidence of bile leak. IMPRESSION: Normal hepatobiliary scan. No evidence of cholecystitis, biliary obstruction or bile leak.  Electronically Signed   By: Elsie Perone M.D.   On: 09/18/2024 14:50   MR BRAIN WO CONTRAST Result Date: 09/17/2024 EXAM: MRI BRAIN WITHOUT CONTRAST 09/17/2024 03:19:33 PM TECHNIQUE: Multiplanar multisequence MRI of the head/brain was performed without the administration of intravenous contrast. COMPARISON: None available. CLINICAL HISTORY: Concern for acute ischemic stroke. Tia with aphasia and weakness. Anemia. FINDINGS: BRAIN AND VENTRICLES: Punctate focus of diffusion abnormality is present in the anterior right thalamus. Focal restricted diffusion is present in the left occipital lobe. No intracranial hemorrhage. No mass. No midline shift. No hydrocephalus. Confluent Periventricular and scattered subcortical T2 hyperintensities bilaterally are moderately advanced for age. A remote lacunar infarct is present in the anterior right lentiform nucleus. A remote left paramedian pontine infarct demonstrates expected interval change. The sella is unremarkable. Normal flow voids. ORBITS: No acute abnormality. SINUSES AND MASTOIDS: No acute abnormality. BONES AND SOFT TISSUES: Normal marrow signal. No acute soft tissue abnormality. IMPRESSION: 1. Acute punctate diffusion abnormality in the anterior right thalamus and focal restricted diffusion in the left occipital lobe, consistent with focal acute infarcts. 2. Moderately advanced confluent periventricular and scattered subcortical T2 hyperintensities bilaterally. 3. Remote lacunar infarct in the anterior right lentiform nucleus. 4. Remote left paramedian pontine infarct with expected interval change. Electronically signed by: Lonni Necessary MD 09/17/2024 03:58 PM EDT RP Workstation: [id number]   US  RENAL Result Date: 09/17/2024 CLINICAL DATA:  [id number] AKI (acute kidney injury) [id number] EXAM: RENAL / URINARY TRACT ULTRASOUND COMPLETE COMPARISON:  September 16, 2024 FINDINGS: Right Kidney: Renal measurements: 8.7 x 4.6 x 4.9 cm = volume: 101  mL. Echogenicity is  mildly increased. No mass or hydronephrosis visualized. Left Kidney: Renal measurements: 10.3 x 3.6 x 4.8 cm = volume: 93 mL. Echogenicity is mildly increased. No mass or hydronephrosis visualized. Bladder: Decompressed around a Foley catheter. Other: Cholelithiasis.  Splenomegaly.  Trace ascites. IMPRESSION: 1. No hydronephrosis. 2. Mildly increased renal echogenicity as can be seen in medical renal disease. 3. Splenomegaly. 4. Cholelithiasis. Electronically Signed   By: Corean Salter M.D.   On: 09/17/2024 12:57   US  Abdomen Limited RUQ (LIVER/GB) Result Date: 09/16/2024 CLINICAL DATA:  Transaminitis. EXAM: ULTRASOUND ABDOMEN LIMITED RIGHT UPPER QUADRANT COMPARISON:  None Available. FINDINGS: Gallbladder: Multiple shadowing, echogenic, mobile gallstones are seen within the gallbladder lumen. The largest measures approximately 1.3 cm. Gallbladder wall thickening is seen (7.0 mm). Pericholecystic fluid is also present. No sonographic Murphy sign noted by sonographer. Common bile duct: Diameter: 5.1 mm Liver: No focal lesion identified. Within normal limits in parenchymal echogenicity. Portal vein is patent on color Doppler imaging with normal direction of blood flow towards the liver. Other: Of incidental note is the presence of a right sided pleural effusion. IMPRESSION: 1. Cholelithiasis with additional findings consistent with sequelae associated with acute cholecystitis. 2. Right pleural effusion. Electronically Signed   By: Suzen Dials M.D.   On: 09/16/2024 23:35   CT ANGIO HEAD NECK W WO CM (CODE STROKE) Result Date: 09/16/2024 EXAM: CTA HEAD AND NECK WITH AND WITHOUT 09/16/2024 06:24:15 PM TECHNIQUE: CTA of the head and neck was performed with and without the administration of 50 mL of intravenous contrast (iohexol  (OMNIPAQUE ) 350 MG/ML injection 75 mL IOHEXOL  350 MG/ML SOLN). Multiplanar 2D and/or 3D reformatted images are provided for review. Automated exposure control, iterative  reconstruction, and/or weight based adjustment of the mA/kV was utilized to reduce the radiation dose to as low as reasonably achievable. Stenosis of the internal carotid arteries measured using NASCET criteria. COMPARISON: CT angio head and neck 02/09/2024 at [facility] and CTA head and neck 03/04/2021 at [facility]. CLINICAL HISTORY: Neuro deficit, acute, stroke suspected. FINDINGS: CTA NECK: AORTIC ARCH AND ARCH VESSELS: Extensive atherosclerotic changes are again noted at the aortic arch and great vessel origins. Severe stenosis of the left subclavian artery proximal to the vertebral artery has again noted. No dissection or arterial injury. No significant stenosis of the brachiocephalic artery. CERVICAL CAROTID ARTERIES: Atherosclerotic calcifications are present throughout the right common carotid artery without a significant stenosis relative to the more distal vessel. Atherosclerotic calcifications are present in the proximal right ICA without significant stenosis. Diffuse mural calcification are present throughout the left common carotid artery without significant stenosis. Patient motion is present through the area of stenosis previously noted in the proximal left ICA. The stenosis is not significantly changed. No dissection or arterial injury. CERVICAL VERTEBRAL ARTERIES: The left vertebral artery is occluded in the V1 and proximal left V2 segments. It is partially reconstituted in the neck. The right vertebral artery shows no dissection, arterial injury, or significant stenosis in its cervical segments. LUNGS AND MEDIASTINUM: Unremarkable. SOFT TISSUES: No acute abnormality. BONES: Multilevel degenerative changes are again noted in the cervical spine. CTA HEAD: ANTERIOR CIRCULATION: Atherosclerotic calcifications are present within the cavernous internal carotid arteries bilaterally without significant stenoses through the ICA terminae. New left A1 segment is hypoplastic. No significant  stenosis of the middle cerebral arteries. No aneurysm. POSTERIOR CIRCULATION: A fetal-type right cerebral artery is again noted. Moderate narrowing is present in the left P1 segment. Stable mild narrowing in the  mid basilar artery is stable. Moderate narrowing is present in the distal vertebral arteries bilaterally. No aneurysm. OTHER: ECA branch vessels are opacified bilaterally. No dural venous sinus thrombosis on this non-dedicated study. IMPRESSION: 1. No large vessel occlusion or aneurysm in the head or neck. 2. 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 3. 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 4. Moderate narrowing in the left P1 segment, distal vertebral arteries bilaterally, and stable mild narrowing in the mid basilar artery. 5. Extensive atherosclerotic changes as described. Findings were communicated to doctor Michaela via the amion system at 06:48 pm Electronically signed by: Lonni Necessary MD 09/16/2024 07:00 PM EDT RP Workstation: [id number]   CT HEAD CODE STROKE WO CONTRAST Result Date: 09/16/2024 EXAM: CT HEAD WITHOUT CONTRAST 09/16/2024 06:12:59 PM TECHNIQUE: CT of the head was performed without the administration of intravenous contrast. Automated exposure control, iterative reconstruction, and/or weight based adjustment of the mA/kV was utilized to reduce the radiation dose to as low as reasonably achievable. The study is mildly degraded by patient motion. COMPARISON: CT head without contrast 01/11/2024. MR head without contrast 03/09/2024 at [facility]. CLINICAL HISTORY: Neuro deficit, acute, stroke suspected. FINDINGS: BRAIN AND VENTRICLES: No acute hemorrhage. No evidence of acute infarct. No hydrocephalus. No extra-axial collection. No mass effect or midline shift. Remote lacunar infarcts are again noted within the right caudate head and internal capsule.  Confluent Periventricular and scattered subcortical white matter hypoattenuation is moderately advanced for age. This most likely reflects the sequelae of chronic microvascular ischemia. Atherosclerotic calcifications are present in the cavernous carotid arteries bilaterally and at the dural margin of both vertebral arteries. No hyperdense vessel is present. ORBITS: No acute abnormality. SINUSES: No acute abnormality. SOFT TISSUES AND SKULL: No acute soft tissue abnormality. No skull fracture. [place] stroke program early CT (aspect) score: Ganglionic (caudate, ic, Lentiform Nucleus, insula, M1-m3): 7 Supraganglionic (m4-m6): 3 Total: 10 IMPRESSION: 1. No acute intracranial abnormality.  ASPECTS 10/10 2. Remote lacunar infarcts in the right caudate head and internal capsule. 3. Moderately advanced confluent periventricular and scattered subcortical white matter hypoattenuation, likely sequelae of chronic microvascular ischemia. 4. Atherosclerotic calcifications in the cavernous carotid arteries bilaterally and at the dural margin of both vertebral arteries. No hyperdense vessel is present. The pertinent results were texted to Dr. Michaela via the [facility] system at 6:21 pm. Electronically signed by: Lonni Necessary MD 09/16/2024 06:23 PM EDT RP Workstation: [id number]   DG Chest Portable 1 View Result Date: 09/07/2024 CLINICAL DATA:  Sob EXAM: PORTABLE CHEST 1 VIEW COMPARISON:  Chest x-ray 08/24/2024, CT chest 01/30/2024 FINDINGS: The heart and mediastinal contours are unchanged. Atherosclerotic plaque. Mitral annular calcification. No focal consolidation. Increased interstitial markings . No pleural effusion. No pneumothorax. No acute osseous abnormality. IMPRESSION: 1. Pulmonary edema. 2.  Aortic Atherosclerosis (ICD10-I70.0). Electronically Signed   By: Morgane  Naveau M.D.   On: 09/07/2024 00:57   CT Renal Stone Study Result Date: 09/01/2024 EXAM: CT UROGRAM 09/01/2024 03:10:35 AM TECHNIQUE: CT of the  abdomen and pelvis was performed without IV contrast. Automated exposure control, iterative reconstruction, and/or weight based adjustment of the mA/kV was utilized to reduce the radiation dose to as low as reasonably achievable. COMPARISON: 08/26/2022 CLINICAL HISTORY: Abdominal/flank pain, stone suspected. Patient from home for blood in urine with blood clots that started today. Reports painful urination. FINDINGS: LOWER CHEST: No acute abnormality. LIVER: The liver is unremarkable. GALLBLADDER  AND BILE DUCTS: Cholelithiasis without evidence of acute cholecystitis. SPLEEN: Markedly enlarged spleen measuring 19.1 cm in craniocaudal dimension. This has increased from 08/26/22, when it measured 17.0 cm. PANCREAS: No acute abnormality. ADRENAL GLANDS: No acute abnormality. KIDNEYS, URETERS AND BLADDER: No urinary calculi or hydronephrosis. Vascular calcifications in the bilateral renal arteries. Mild perivesical fat stranding. Correlate for urinalysis for cystitis. 1.3 x 1.3 cm mural nodule in the posterior left bladder (series 2 image 74). This is incompletely evaluated without IV contrast. In the setting of hematuria, this could represent a blood clot. However bladder malignancy is not excluded. Cystoscopy is recommended to further evaluate. GI AND BOWEL: Stool ball in the rectum. There is no bowel obstruction. PERITONEUM AND RETROPERITONEUM: No ascites. No free air. VASCULATURE: Advanced aortic atherosclerotic calcification. LYMPH NODES: No lymphadenopathy. REPRODUCTIVE ORGANS: No acute abnormality. BONES AND SOFT TISSUES: No acute osseous abnormality. No focal soft tissue abnormality. IMPRESSION: 1. 1.3 cm mural nodule in the posterior left bladder, incompletely evaluated without IV contrast. Differential includes blood clot versus malignancy. Cystoscopy is recommended. 2. Mild perivesical fat stranding, correlate for urinalysis for cystitis. 3. Markedly enlarged spleen measuring 19.1 cm, increased from 17.0 cm on  08/26/22. 4. Fecal ball in the rectum. Correlate for constipation. Electronically signed by: Norman Gatlin MD 09/01/2024 03:43 AM EDT RP Workstation: [id number]    Lab Results: Personally reviewed CBC    Component Value Date/Time   WBC 26.4 (HH) 09/22/2024 1010   WBC 24.0 (H) 09/20/2024 0250   RBC 3.65 (L) 09/22/2024 1010   RBC 3.38 (L) 09/20/2024 0250   HGB 8.5 (LL) 09/22/2024 1010   HCT 29.3 (L) 09/22/2024 1010   PLT 316 09/22/2024 1010   MCV 80 09/22/2024 1010   MCH 23.3 (L) 09/22/2024 1010   MCH 23.1 (L) 09/20/2024 0250   MCHC 29.0 (L) 09/22/2024 1010   MCHC 30.0 09/20/2024 0250   RDW 24.2 (H) 09/22/2024 1010   LYMPHSABS 0.9 09/22/2024 1010   MONOABS 0.0 (L) 09/17/2024 1756   EOSABS 0.5 (H) 09/22/2024 1010   BASOSABS 0.2 09/22/2024 1010    BMET    Component Value Date/Time   NA 137 09/22/2024 1010   K 4.9 09/22/2024 1010   CL 103 09/22/2024 1010   CO2 20 09/22/2024 1010   GLUCOSE 78 09/22/2024 1010   GLUCOSE 82 09/19/2024 0240   BUN 38 (H) 09/22/2024 1010   CREATININE 1.15 (H) 09/22/2024 1010   CREATININE 1.44 (H) 08/03/2024 1024   CREATININE 1.33 (H) 03/03/2016 1531   CALCIUM  7.5 (L) 09/22/2024 1010   GFRNONAA 30 (L) 09/19/2024 0240   GFRNONAA 39 (L) 08/03/2024 1024   GFRAA 73 11/24/2019 0948    BNP    Component Value Date/Time   BNP 462.8 (H) 06/23/2023 1205    ProBNP No results found for: PROBNP  Specialty Problems       Pulmonary Problems   Chronic hypoxic respiratory failure (HCC)   Reactive airway disease    Allergies  Allergen Reactions   Atenolol Rash and Itching   Demerol   [Meperidine  Hcl] Itching   Demerol  [Meperidine ] Itching and Other (See Comments)    Unknown, can't remember    Gabapentin  Anxiety and Other (See Comments)    SI and HI   Inderal [Propranolol] Other (See Comments) and Itching    Doesn't remember    Prednisone Other (See Comments)    Muscle spasms   Statins Itching and Other (See Comments)    Simvastatin  -  caused severe itching Pravastatin  -  caused lesser tiching One type of statin? Caused stroke in 2006, and other symptoms as result   Warfarin And Related Other (See Comments)    Caused nose bleeds   Crestor [Rosuvastatin] Itching and Rash   Docosahexaenoic Acid (Dha) (Fish)     nosebleeds   Lactose Intolerance (Gi) Other (See Comments)    Bloating, gas   Lipitor [Atorvastatin ] Rash   Lovaza [Omega-3-Acid Ethyl Esters (Fish)] Other (See Comments)    nosebleeds    Penicillins Hives, Rash and Other (See Comments)    Immediate rash, facial/tongue/throat swelling, SOB or lightheadedness with hypotension Questionable high fever    Pravastatin  Itching and Rash   Simvastatin  Itching, Rash and Other (See Comments)   Trilipix [Choline Fenofibrate ] Other (See Comments)    Doesn't remember    Warfarin Other (See Comments)    nosebleeds   Hydralazine  Swelling   Effexor [Venlafaxine Hcl] Other (See Comments)    Doesn't remember    Nexium [Esomeprazole Magnesium ] Rash   Percocet [Oxycodone -Acetaminophen ] Other (See Comments)    Doesn't remember    Plavix  [Clopidogrel  Bisulfate] Rash    Immunization History  Administered Date(s) Administered   Influenza-Unspecified 11/02/1995   Tdap 09/09/2018    Past Medical History:  Diagnosis Date   Acute blood loss anemia 05/04/2020   Acute ischemic stroke (HCC)    Acute respiratory failure with hypoxia (HCC) 01/29/2024   AKI (acute kidney injury)    Allergy ?, Don't remember   Arthritis    fingers, some in my knees (01/28/2016)   Atrial fibrillation with RVR, hx of PAF previously 04/27/2013   Paroxysmal atrial fibrillation     Formatting of this note might be different from the original.  Overview:   Paroxysmal atrial fibrillation     Last Assessment & Plan:   History of PAF maintaining sinus rhythm on flecainide  and Eliquis  oral anti-coagulation     Blood transfusion without reported diagnosis    During surgery   CAD (coronary artery disease),  per cath 04/27/13, non obstructive disease, EF 65-70% 05/11/2013   Carotid disease, bilateral 09/20/2013   Lt carotid 50-69% stentosis, less on rt   Cataract    Don't remember   Cellulitis of left leg 01/16/2022   Closed fracture of left tibial plateau 10/23/2018   Depression Don't remember   Dysrhythmia    Elevated troponin 05/04/2020   Encounter for support and coordination of transition of care 11/11/2021   Fatty liver    Fracture of ankle 10/23/2018   GERD (gastroesophageal reflux disease)    Glaucoma    H/O cardiovascular stress test 12/27/2009   negative for ischemia   Headache    occasional since eye pressure regulated (01/28/2016)   Hospital discharge follow-up 10/15/2021   Hyperlipidemia    Hypertension    Hypertensive crisis 01/08/2016   Formatting of this note might be different from the original.  hypertension     Last Assessment & Plan:   BP at goal and significantly improved from previous visit. Patient tolerating valsartan  and chlorthalidone  very well. Reports 1 episode of symptomatic hypotension that resolved after few hours. Denies falls or increase fatigue. Will continue current therapy without changes and follow up as nee   Hypertensive emergency 11/28/2022   Leukocytosis 07/15/2015   Migraine    stopped w/laser holes to relieve pressure in my eyes; had them 3-4 times/wk before taht (01/28/2016)   Mitral regurgitation,2+ by cath 05/11/2013   NSTEMI (non-ST elevated myocardial infarction) (HCC) 04/27/2013   Osteoporosis IDK  Pain    BOTH KNEES - PT HAS TORN MENISCUS LEFT KNEE   Pain in both wrists 09/03/2021   Pain in left knee 10/03/2018   Paroxysmal atrial fibrillation (HCC)    hx of a fib on ASA  AND ELIQUIS     Pelvic fracture (HCC) 09/09/2018   Polycythemia vera (HCC) 08/20/2015   JAK-2 positive 07/19/15   Prosthesis adjustment 01/13/2019   PVD (peripheral vascular disease) with claudication 07/21/2006   previous L ext iliac stent and rt SFA stent, known  occluded Lt SFA   S/P cardiac cath 04/27/2013   NON OBSTRUCTIVE DISEASE, 2+ mr, ef 65-70%   Sacral fracture, closed (HCC) 05/04/2020   Statin intolerance    Stroke (HCC) 12/21/2004   SWELLING ALL OVER AND RT SIDE OF FACE DRAWN AND SPEECH SLURRED AND NUMBNESS ON RIGHT SIDE-- ALL RESOLVED   TIA (transient ischemic attack)    Trauma 05/04/2020    Tobacco History: Social History   Tobacco Use  Smoking Status Former   Current packs/day: 0.00   Average packs/day: 1 pack/day for 45.0 years (45.0 ttl pk-yrs)   Types: Cigarettes   Start date: 11/12/1967   Quit date: 11/11/2012   Years since quitting: 11.8  Smokeless Tobacco Never  Tobacco Comments   quit 4 years ago.   10/27/21-Pt declines lung ca screen at this time.    Counseling given: Not Answered Tobacco comments: quit 4 years ago. 10/27/21-Pt declines lung ca screen at this time.    Continue to not smoke  Outpatient Encounter Medications as of 09/26/2024  Medication Sig   apixaban  (ELIQUIS ) 2.5 MG TABS tablet Take 1 tablet (2.5 mg total) by mouth 2 (two) times daily.   arformoterol  (BROVANA ) 15 MCG/2ML NEBU Take 2 mLs (15 mcg total) by nebulization 2 (two) times daily.   budesonide  (PULMICORT ) 0.25 MG/2ML nebulizer solution Take 2 mLs (0.25 mg total) by nebulization in the morning and at bedtime.   calcitRIOL (ROCALTROL) 0.25 MCG capsule Take 0.25 mcg by mouth 2 (two) times a week.   colchicine  0.6 MG tablet TAKE ONE TABLET ONCE DAILY   diclofenac  Sodium (VOLTAREN ) 1 % GEL Apply 2 g topically 4 (four) times daily as needed (pain).   diltiazem  (CARDIZEM  CD) 240 MG 24 hr capsule Take 240 mg by mouth daily.   escitalopram  (LEXAPRO ) 20 MG tablet TAKE ONE TABLET ONCE DAILY   ezetimibe  (ZETIA ) 10 MG tablet Take 1 tablet (10 mg total) by mouth daily.   flecainide  (TAMBOCOR ) 50 MG tablet Take 50 mg by mouth 2 (two) times daily.   zinc  oxide 20 % ointment Apply 1 Application topically as needed for irritation.   alendronate  (FOSAMAX )  70 MG tablet TAKE ONE TABLET ONCE WEEKLY (Patient not taking: Reported on 09/26/2024)   fenofibrate  (TRICOR ) 145 MG tablet Take 1 tablet (145 mg total) by mouth daily. (Patient not taking: Reported on 09/26/2024)   ferrous sulfate  325 (65 FE) MG EC tablet Take 1 tablet (325 mg total) by mouth 2 (two) times daily. (Patient not taking: Reported on 09/26/2024)   furosemide  (LASIX ) 40 MG tablet Take 1 tablet (40 mg total) by mouth daily as needed for fluid or edema. (Patient not taking: Reported on 09/26/2024)   pantoprazole  (PROTONIX ) 40 MG tablet TAKE ONE TABLET ONCE DAILY (Patient not taking: Reported on 09/26/2024)   sodium bicarbonate  650 MG tablet Take 1 tablet (650 mg total) by mouth 2 (two) times daily. (Patient not taking: Reported on 09/26/2024)   No facility-administered encounter medications on  file as of 09/26/2024.     Review of Systems  Review of Systems  No chest pain with exertion.  No orthopnea or PND.  Comprehensive review of systems otherwise negative. Physical Exam  BP 123/78   Pulse 98   Temp 97.8 F (36.6 C) (Oral)   Ht 5' 4 (1.626 m)   Wt 92 lb (41.7 kg)   SpO2 92%   BMI 15.79 kg/m   Wt Readings from Last 5 Encounters:  09/26/24 92 lb (41.7 kg)  09/22/24 92 lb (41.7 kg)  09/16/24 91 lb 14.9 oz (41.7 kg)  09/13/24 92 lb (41.7 kg)  09/01/24 90 lb 13.3 oz (41.2 kg)    BMI Readings from Last 5 Encounters:  09/26/24 15.79 kg/m  09/22/24 15.79 kg/m  09/16/24 15.78 kg/m  09/13/24 15.79 kg/m  09/01/24 15.59 kg/m     Physical Exam General: Sitting in chair, no distress, frail Eyes: EOMI no icterus Neck: No JVP appreciated sitting upright Pulmonary: Distant, clear Cardiovascular: Edema in lower extremities noted, warm   Assessment & Plan:   Dyspnea on exertion: Suspect largely driven by CHF given imaging findings of volume overload earlier in 2025.  As well as development of anemia.  Largely contributed to by deconditioning with gradual reduction in  activity over time related to stroke etc.  Something like asthma or smoking-related lung disease is possible.  Notably eosinophils mildly elevated on most recent labs 500.  Will try nebulized arformoterol  and budesonide  twice daily to see if symptomatic benefit.  PFTs for further evaluation.  Ground glass opacities on CT scan: Seen 01/2024.  In the setting of bilateral pleural effusions this most likely represents cardiogenic pulmonary edema.  Continue close follow-up with cardiology, low-salt diet, adherence to diuretics.   Return in about 2 months (around 11/26/2024) for   f/u Dr. Annella, after PFT same day.   Donnice JONELLE Annella, MD 09/27/2024  I spent 61 minutes in the care of the patient including face-to-face visit, review of records, coordination of care.

## 2024-09-29 ENCOUNTER — Ambulatory Visit

## 2024-09-29 ENCOUNTER — Other Ambulatory Visit: Payer: Self-pay

## 2024-09-29 ENCOUNTER — Telehealth: Payer: Self-pay

## 2024-09-29 DIAGNOSIS — D45 Polycythemia vera: Secondary | ICD-10-CM

## 2024-09-29 DIAGNOSIS — R0609 Other forms of dyspnea: Secondary | ICD-10-CM

## 2024-09-29 DIAGNOSIS — Z89611 Acquired absence of right leg above knee: Secondary | ICD-10-CM

## 2024-09-29 DIAGNOSIS — I739 Peripheral vascular disease, unspecified: Secondary | ICD-10-CM

## 2024-09-29 DIAGNOSIS — N1831 Chronic kidney disease, stage 3a: Secondary | ICD-10-CM

## 2024-09-29 DIAGNOSIS — J449 Chronic obstructive pulmonary disease, unspecified: Secondary | ICD-10-CM

## 2024-09-29 DIAGNOSIS — E46 Unspecified protein-calorie malnutrition: Secondary | ICD-10-CM

## 2024-09-29 DIAGNOSIS — D631 Anemia in chronic kidney disease: Secondary | ICD-10-CM

## 2024-09-29 DIAGNOSIS — L89524 Pressure ulcer of left ankle, stage 4: Secondary | ICD-10-CM

## 2024-09-29 DIAGNOSIS — I509 Heart failure, unspecified: Secondary | ICD-10-CM

## 2024-09-29 DIAGNOSIS — I69351 Hemiplegia and hemiparesis following cerebral infarction affecting right dominant side: Secondary | ICD-10-CM

## 2024-09-29 DIAGNOSIS — I480 Paroxysmal atrial fibrillation: Secondary | ICD-10-CM

## 2024-09-29 DIAGNOSIS — I130 Hypertensive heart and chronic kidney disease with heart failure and stage 1 through stage 4 chronic kidney disease, or unspecified chronic kidney disease: Secondary | ICD-10-CM

## 2024-09-29 NOTE — Telephone Encounter (Signed)
 Copied from CRM [id number]. Topic: Clinical - Prescription Issue >> Sep 28, 2024  1:44 PM [name] wrote: Reason for CRM: pt has nebulizer solution but no nebulizer.  Pt daughter [name] calling.  She said pt needs to use synapse for that.  Spoke w/ patient daughter okay per DPR - order has been placed for neb machine

## 2024-10-01 NOTE — Progress Notes (Signed)
 Chief Complaint: Blood in urine  History of Present Illness:  9.23.2025: Initial visit for this 73 yo female  w/ painful urination as well as gross hematuria for the past 3 weeks or so.  She started passing blood in her urine on the 2nd or 3rd of this month.  She has had a few emergency room visits.  She has had 2 negative urine cultures.  She has been on antibiotics, however.  CT was performed showing a small left-sided mural mass of the bladder.  She is on both Eliquis  and aspirin .  She has a history of polycythemia vera as well as atrial fibrillation, peripheral vascular disease and multiple other medical comorbidities.  She has had a right BKA.  10.13.2025: This woman comes in today for cystoscopy.  She has not started antibiotics yet for urine culture positive for resistant Enterococcus.  It is sensitive to tetracycline.  She has a prescription pending for doxycycline .  No recent hematuria.   Past Medical History:  Past Medical History:  Diagnosis Date   Acute blood loss anemia 05/04/2020   Acute ischemic stroke (HCC)    Acute respiratory failure with hypoxia (HCC) 01/29/2024   AKI (acute kidney injury)    Allergy ?, Don't remember   Arthritis    fingers, some in my knees (01/28/2016)   Atrial fibrillation with RVR, hx of PAF previously 04/27/2013   Paroxysmal atrial fibrillation     Formatting of this note might be different from the original.  Overview:   Paroxysmal atrial fibrillation     Last Assessment & Plan:   History of PAF maintaining sinus rhythm on flecainide  and Eliquis  oral anti-coagulation     Blood transfusion without reported diagnosis    During surgery   CAD (coronary artery disease), per cath 04/27/13, non obstructive disease, EF 65-70% 05/11/2013   Carotid disease, bilateral 09/20/2013   Lt carotid 50-69% stentosis, less on rt   Cataract    Don't remember   Cellulitis of left leg 01/16/2022   Closed fracture of left tibial plateau 10/23/2018   Depression  Don't remember   Dysrhythmia    Elevated troponin 05/04/2020   Encounter for support and coordination of transition of care 11/11/2021   Fatty liver    Fracture of ankle 10/23/2018   GERD (gastroesophageal reflux disease)    Glaucoma    H/O cardiovascular stress test 12/27/2009   negative for ischemia   Headache    occasional since eye pressure regulated (01/28/2016)   Hospital discharge follow-up 10/15/2021   Hyperlipidemia    Hypertension    Hypertensive crisis 01/08/2016   Formatting of this note might be different from the original.  hypertension     Last Assessment & Plan:   BP at goal and significantly improved from previous visit. Patient tolerating valsartan  and chlorthalidone  very well. Reports 1 episode of symptomatic hypotension that resolved after few hours. Denies falls or increase fatigue. Will continue current therapy without changes and follow up as nee   Hypertensive emergency 11/28/2022   Leukocytosis 07/15/2015   Migraine    stopped w/laser holes to relieve pressure in my eyes; had them 3-4 times/wk before taht (01/28/2016)   Mitral regurgitation,2+ by cath 05/11/2013   NSTEMI (non-ST elevated myocardial infarction) (HCC) 04/27/2013   Osteoporosis IDK   Pain    BOTH KNEES - PT HAS TORN MENISCUS LEFT KNEE   Pain in both wrists 09/03/2021   Pain in left knee 10/03/2018   Paroxysmal atrial fibrillation (HCC)  hx of a fib on ASA  AND ELIQUIS     Pelvic fracture (HCC) 09/09/2018   Polycythemia vera (HCC) 08/20/2015   JAK-2 positive 07/19/15   Prosthesis adjustment 01/13/2019   PVD (peripheral vascular disease) with claudication 07/21/2006   previous L ext iliac stent and rt SFA stent, known occluded Lt SFA   S/P cardiac cath 04/27/2013   NON OBSTRUCTIVE DISEASE, 2+ mr, ef 65-70%   Sacral fracture, closed (HCC) 05/04/2020   Statin intolerance    Stroke (HCC) 12/21/2004   SWELLING ALL OVER AND RT SIDE OF FACE DRAWN AND SPEECH SLURRED AND NUMBNESS ON RIGHT SIDE--  ALL RESOLVED   TIA (transient ischemic attack)    Trauma 05/04/2020    Past Surgical History:  Past Surgical History:  Procedure Laterality Date   ABDOMINAL HYSTERECTOMY  1977   After childbirth   AMPUTATION Right 05/21/2017   Procedure: AMPUTATION ABOVE KNEE;  Surgeon: Oris Krystal FALCON, MD;  Location: [facility] OR;  Service: Vascular;  Laterality: Right;   CARDIAC CATHETERIZATION  04/27/2013   non occlusive disease with mild to mod. calcified lesions in ostial LM and proximal LAD and moderate prox. RC AND DISTAL AV GROOVE LCX, ef   CATARACT EXTRACTION W/PHACO Right 03/21/2015   Procedure: CATARACT EXTRACTION PHACO AND INTRAOCULAR LENS PLACEMENT RIGHT EYE;  Surgeon: Lynwood Hermann, MD;  Location: [facility] ORS;  Service: Ophthalmology;  Laterality: Right;  CDE:5.10   CATARACT EXTRACTION W/PHACO Left 05/16/2015   Procedure: CATARACT EXTRACTION PHACO AND INTRAOCULAR LENS PLACEMENT (IOC);  Surgeon: Lynwood Hermann, MD;  Location: [facility] ORS;  Service: Ophthalmology;  Laterality: Left;  CDE 5.57   CESAREAN SECTION  12/22/1975   COLONOSCOPY N/A 09/11/2016   Procedure: COLONOSCOPY;  Surgeon: Claudis RAYMOND Rivet, MD;  Location: [facility];  Service: Endoscopy;  Laterality: N/A;  730-moved to 1:00 Ann notified pt   EMBOLECTOMY Right 02/01/2016   Procedure: Thrombectomy of Right Femoral-Popliteal Bypass Graft; Endarterectomy of Right Below Knee Popliteal Artery and Tibial Peroneal Trunk with Bovine Pericardium Patch Angioplasty.;  Surgeon: Lonni GORMAN Blade, MD;  Location: [facility] OR;  Service: Vascular;  Laterality: Right;   EYE SURGERY     FEMORAL-POPLITEAL BYPASS GRAFT Right 01/31/2016   Procedure: BYPASS GRAFT RIGHT COMMON  FEMORALTO BELOW KNEE POPLITEAL ARTERY BYPASS GRAFT USING PROPATEN GORTEX GRAFT;  Surgeon: Lynwood JONETTA Collum, MD;  Location: [facility] OR;  Service: Vascular;  Laterality: Right;   FEMORAL-POPLITEAL BYPASS GRAFT Right 01/09/2017   Procedure: THROMBECTOMY OF RIGHT FEMORAL-POPLITEAL  ARTERY BYPASS GRAFT;  THROMBECTOMY RIGHT  TIBIAL VESSELS;  Surgeon: Carlin FORBES Haddock, MD;  Location: [facility] OR;  Service: Vascular;  Laterality: Right;   FEMORAL-POPLITEAL BYPASS GRAFT Right 05/17/2017   Procedure: RIGHT FEMORAL-TIBIAL PERONEAL TRUNK ARTERY BYPASS GRAFT USING 23mmX80cm PROPATEN GRAFT WITH REMOVABLE RING;  Surgeon: Oris Krystal FALCON, MD;  Location: [facility] OR;  Service: Vascular;  Laterality: Right;   FRACTURE SURGERY     ILIAC ARTERY STENT  07/21/2006   external iliac and Rt SFA stent 07/2006 AND THE LEFT WAS IN 2006   KNEE ARTHROSCOPY WITH MEDIAL MENISECTOMY Left 03/27/2014   Procedure: left knee arthorscopy with medial chondraplasty of the medial femoral and patella, medial microfracture technique of medial femoral condyl;  Surgeon: Tanda DELENA Heading, MD;  Location: [facility] ORS;  Service: Orthopedics;  Laterality: Left;   LEFT HEART CATHETERIZATION WITH CORONARY ANGIOGRAM Right 04/27/2013   Procedure: LEFT HEART CATHETERIZATION WITH CORONARY ANGIOGRAM;  Surgeon: Alm LELON Clay, MD;  Location: [facility] CATH LAB;  Service: Cardiovascular;  Laterality: Right;   LOWER EXTREMITY ANGIOGRAM Right 01/31/2016   Procedure: INTRAOP RIGHT LOWER EXTREMITY ANGIOGRAM;  Surgeon: Lynwood JONETTA Collum, MD;  Location: [facility] OR;  Service: Vascular;  Laterality: Right;   ORIF WRIST FRACTURE Right 12/21/1981   OVARIAN CYST REMOVAL Right    PATCH ANGIOPLASTY Right 01/31/2016   Procedure: RIGHT COMMON FEMORAL AND PROFUNDA FEMORIS ENDARECTOMY WITH PATCH ANGIOPLASTY;  Surgeon: Lynwood JONETTA Collum, MD;  Location: [facility] OR;  Service: Vascular;  Laterality: Right;   PATCH ANGIOPLASTY Right 01/09/2017   Procedure: PATCH ANGIOPLASTY RIGHT POPLITEAL ARTERY BYPASS GRAFT;  Surgeon: Carlin FORBES Haddock, MD;  Location: [facility] OR;  Service: Vascular;  Laterality: Right;   PERIPHERAL VASCULAR CATHETERIZATION N/A 06/27/2015   Procedure: Lower Extremity Angiography;  Surgeon: Dorn JINNY Lesches, MD;  [facility]: [facility] INVASIVE CV LAB;  Service: Cardiovascular;  Laterality: N/A;   PERIPHERAL  VASCULAR CATHETERIZATION Bilateral 01/29/2016   Procedure: Lower Extremity Angiography;  Surgeon: Deatrice DELENA Cage, MD;  [facility]: [facility] INVASIVE CV LAB;  Service: Cardiovascular;  Laterality: Bilateral;   PERIPHERAL VASCULAR CATHETERIZATION N/A 01/29/2016   Procedure: Abdominal Aortogram;  Surgeon: Deatrice DELENA Cage, MD;  Location: [facility] INVASIVE CV LAB;  Service: Cardiovascular;  Laterality: N/A;   REFRACTIVE SURGERY Bilateral    6 in one eye; 7 in the other; to relieve pressure; not glaucoma (01/28/2016)   SFA Right 06/27/2015   overlapping Bahn covered stents   TUBAL LIGATION  12/21/1981   VEIN HARVEST Left 05/17/2017   Procedure: LEFT LEG GREATER SAPHENOUS VEIN HARVEST;  Surgeon: Oris Krystal FALCON, MD;  Location: [facility] OR;  Service: Vascular;  Laterality: Left;   VEIN REPAIR Right 01/31/2016   Procedure: RIGHT GREATER SAPHENOUS VEIN EXAMNED BUT NOT REMOVED;  Surgeon: Lynwood JONETTA Collum, MD;  Location: [facility] OR;  Service: Vascular;  Laterality: Right;    Allergies:  Allergies  Allergen Reactions   Atenolol Rash and Itching   Demerol   [Meperidine  Hcl] Itching   Demerol  [Meperidine ] Itching and Other (See Comments)    Unknown, can't remember    Gabapentin  Anxiety and Other (See Comments)    SI and HI   Inderal [Propranolol] Other (See Comments) and Itching    Doesn't remember    Prednisone Other (See Comments)    Muscle spasms   Statins Itching and Other (See Comments)    Simvastatin  - caused severe itching Pravastatin  - caused lesser tiching One type of statin? Caused stroke in 2006, and other symptoms as result   Warfarin And Related Other (See Comments)    Caused nose bleeds   Crestor [Rosuvastatin] Itching and Rash   Docosahexaenoic Acid (Dha) (Fish)     nosebleeds   Lactose Intolerance (Gi) Other (See Comments)    Bloating, gas   Lipitor [Atorvastatin ] Rash   Lovaza [Omega-3-Acid Ethyl Esters (Fish)] Other (See Comments)    nosebleeds    Penicillins Hives, Rash and Other (See Comments)     Immediate rash, facial/tongue/throat swelling, SOB or lightheadedness with hypotension Questionable high fever    Pravastatin  Itching and Rash   Simvastatin  Itching, Rash and Other (See Comments)   Trilipix [Choline Fenofibrate ] Other (See Comments)    Doesn't remember    Warfarin Other (See Comments)    nosebleeds   Hydralazine  Swelling   Effexor [Venlafaxine Hcl] Other (See Comments)    Doesn't remember    Nexium [Esomeprazole Magnesium ] Rash   Percocet [Oxycodone -Acetaminophen ] Other (See Comments)    Doesn't remember    Plavix  [Clopidogrel  Bisulfate] Rash    Family History:  Family History  Problem Relation Age of Onset   CAD Mother    Lung cancer Mother    Heart attack Mother    Miscarriages / Stillbirths Mother    Obesity Mother    Stroke Mother    Vision loss Mother    CAD Father    Heart disease Father    Heart attack Father 48       died in hes 70's with heart disease   Hearing loss Father    Healthy Sister    Liver cancer Brother    Alcohol  abuse Brother    Arthritis Sister    CAD Brother        has had CABG   CAD Brother    Cancer Maternal Aunt    Cancer Sister     Social History:  Social History   Tobacco Use   Smoking status: Former    Current packs/day: 0.00    Average packs/day: 1 pack/day for 45.0 years (45.0 ttl pk-yrs)    Types: Cigarettes    Start date: 11/12/1967    Quit date: 11/11/2012    Years since quitting: 11.8   Smokeless tobacco: Never   Tobacco comments:    quit 4 years ago.    10/27/21-Pt declines lung ca screen at this time.   Vaping Use   Vaping status: Never Used  Substance Use Topics   Alcohol  use: No    Alcohol /week: 0.0 standard drinks of alcohol    Drug use: No      Results: I have reviewed prior patient's records  I have reviewed referring/prior physicians records  I have reviewed most recent urine culture  I reviewed prior imaging studies. CT images reviewed with pt MPRESSION: 1. 1.3 cm mural nodule  in the posterior left bladder, incompletely evaluated without IV contrast. Differential includes blood clot versus malignancy. Cystoscopy is recommended. 2. Mild perivesical fat stranding, correlate for urinalysis for cystitis. 3. Markedly enlarged spleen measuring 19.1 cm, increased from 17.0 cm on 08/26/22. 4. Fecal ball in the rectum. Correlate for constipation. Assessment: 1.  Small left-sided bladder mass-may well be bladder tumor  2.  3-week history of gross, painful hematuria most likely secondary to the above, plus the patient is on anticoagulation  3.  Recent UTI, untreated   Plan: 1.  Will hold off on cystoscopy today  2.  I will have her come back in the next 1 to 3 weeks for cystoscopy.  Will probably have to get urine specimen by In-N-Out catheter

## 2024-10-02 ENCOUNTER — Telehealth: Payer: Self-pay | Admitting: *Deleted

## 2024-10-02 ENCOUNTER — Ambulatory Visit (INDEPENDENT_AMBULATORY_CARE_PROVIDER_SITE_OTHER): Admitting: Urology

## 2024-10-02 VITALS — BP 159/63 | HR 79 | Ht 65.0 in | Wt 104.0 lb

## 2024-10-02 DIAGNOSIS — R310 Gross hematuria: Secondary | ICD-10-CM | POA: Diagnosis not present

## 2024-10-02 DIAGNOSIS — Z87440 Personal history of urinary (tract) infections: Secondary | ICD-10-CM | POA: Diagnosis not present

## 2024-10-02 DIAGNOSIS — N3289 Other specified disorders of bladder: Secondary | ICD-10-CM

## 2024-10-02 NOTE — Addendum Note (Signed)
 Addended by: KOLEEN MITZIE BROCKS on: 10/02/2024 02:31 PM   Modules accepted: Orders

## 2024-10-02 NOTE — Transitions of Care (Post Inpatient/ED Visit) (Signed)
 10/02/2024  Name: [name] MRN: [id number] DOB: 1951/07/27  Today's TOC FU Call Status: Today's TOC FU Call Status:: Successful TOC FU Call Completed TOC FU Call Complete Date: 10/02/24 Patient's Name and Date of Birth confirmed.  Transition Care Management Follow-up Telephone Call Date of Discharge: 09/29/24 Discharge Facility: Other ([facility] Facility) Name of Other ([facility]) Discharge Facility: [facility] Type of Discharge: Inpatient Admission Primary Inpatient Discharge Diagnosis:: symptomatic anemia How have you been since you were released from the hospital?: Better (eating, drinking well, in wheelchair, daughter assists) Any questions or concerns?: No  Items Reviewed: Did you receive and understand the discharge instructions provided?: Yes Medications obtained,verified, and reconciled?: Yes (Medications Reviewed) Any new allergies since your discharge?: No Dietary orders reviewed?: Yes Type of Diet Ordered:: heart healthy Do you have support at home?: Yes People in Home [RPT]: child(ren), adult Name of Support/Comfort Primary Source: adult daughter Rodgers-  lives with Routed today's note to primary care provider  Medications Reviewed Today: Medications Reviewed Today     Reviewed by Aura Mliss LABOR, RN (Registered Nurse) on 10/02/24 at 1406  Med List Status: <None>   Medication Order Taking? Sig Documenting Provider Last Dose Status Informant  alendronate  (FOSAMAX ) 70 MG tablet [id number] Yes TAKE ONE TABLET ONCE WEEKLY Dettinger, Fonda LABOR, MD  Active Child  apixaban  (ELIQUIS ) 2.5 MG TABS tablet [id number] Yes Take 1 tablet (2.5 mg total) by mouth 2 (two) times daily. [place] Thompson, Nena, NP  Active Child  arformoterol [facility]) 15 MCG/2ML NEBU [id number] Yes Take 2 mLs (15 mcg total) by nebulization 2 (two) times daily. Hunsucker, Donnice SAUNDERS, MD  Active   budesonide (PULMICORT) 0.25 MG/2ML nebulizer solution [id number] Yes Take 2 mLs (0.25 mg total) by  nebulization in the morning and at bedtime. Hunsucker, Donnice SAUNDERS, MD  Active   calcitRIOL (ROCALTROL) 0.25 MCG capsule [id number] Yes Take 0.25 mcg by mouth 2 (two) times a week. [provider]  Active Child  colchicine  0.6 MG tablet [id number] Yes TAKE ONE TABLET ONCE DAILY Dettinger, Fonda LABOR, MD  Active Child  diclofenac  Sodium (VOLTAREN ) 1 % GEL [id number] Yes Apply 2 g topically 4 (four) times daily as needed (pain). Ricky Fines, MD  Active Child  diltiazem  (CARDIZEM  CD) 240 MG 24 hr capsule [id number]  Take 240 mg by mouth daily.  Patient not taking: Reported on 10/02/2024   [provider]  Active Child  escitalopram  (LEXAPRO ) 20 MG tablet [id number] Yes TAKE ONE TABLET ONCE DAILY Dettinger, Fonda LABOR, MD  Active Child  ezetimibe  (ZETIA ) 10 MG tablet [id number] Yes Take 1 tablet (10 mg total) by mouth daily. [place] Thompson, Nena, NP  Active Child  fenofibrate  (TRICOR ) 145 MG tablet [id number] Yes Take 1 tablet (145 mg total) by mouth daily. West, Katlyn D, NP  Active Child  ferrous sulfate 325 (65 FE) MG EC tablet [id number]  Take 1 tablet (325 mg total) by mouth 2 (two) times daily.  Patient not taking: Reported on 10/02/2024   Raenelle Donalda HERO, MD  Active   flecainide  (TAMBOCOR ) 50 MG tablet [id number] Yes Take 50 mg by mouth 2 (two) times daily. [provider]  Active Child  furosemide  (LASIX ) 40 MG tablet [id number] Yes Take 1 tablet (40 mg total) by mouth daily as needed for fluid or edema. Ricky Fines, MD  Active Child           Med Note DINO, NEVADA   Dju Sep 16, 2024  8:37 PM) Not sure  about this medication  pantoprazole  (PROTONIX ) 40 MG tablet [id number] Yes TAKE ONE TABLET ONCE DAILY Dettinger, Fonda LABOR, MD  Active Child  sodium bicarbonate  650 MG tablet [id number]  Take 1 tablet (650 mg total) by mouth 2 (two) times daily.  Patient not taking: Reported on 10/02/2024   Ricky Fines, MD  Active Child  zinc  oxide 20 % ointment [id number] Yes Apply  1 Application topically as needed for irritation. [provider]  Active Child            Home Care and Equipment/Supplies: Were Home Health Services Ordered?: Yes Name of Home Health Agency:: [facility] Has Agency set up a time to come to your home?: Yes First Home Health Visit Date: 10/02/24 Any new equipment or medical supplies ordered?: No  Functional Questionnaire: Do you need assistance with bathing/showering or dressing?: Yes (daughter assists) Do you need assistance with meal preparation?: Yes (daughter assists) Do you need assistance with eating?: No Do you have difficulty maintaining continence: No Do you need assistance with getting out of bed/getting out of a chair/moving?: Yes (in wheelchair due to right AKA) Do you have difficulty managing or taking your medications?: Yes (daughter assists)  Follow up appointments reviewed: PCP Follow-up appointment confirmed?: Yes Date of PCP follow-up appointment?: 10/03/24 Follow-up Provider: Oneil [place] Specialist [facility] Follow-up appointment confirmed?: Yes Date of Specialist follow-up appointment?: 10/02/24 Follow-Up Specialty Provider:: Dr Matilda  urology @ 145 pm Do you need transportation to your follow-up appointment?: No Do you understand care options if your condition(s) worsen?: Yes-patient verbalized understanding  SDOH Interventions Today    Flowsheet Row Most Recent Value  SDOH Interventions   Food Insecurity Interventions Intervention Not Indicated  Housing Interventions Intervention Not Indicated  Transportation Interventions Intervention Not Indicated  Utilities Interventions Intervention Not Indicated    Goals Addressed             This Visit's Progress    VBCI Transitions of Care (TOC) Care Plan       Problems:  Recent Hospitalization for treatment of symptomatic anemia Equipment/DME barrier per daughter [place], nebulizer machine is being delivered, pt already has  medications for nebulizer Pt lives with adult daughter, pt is WC all during the day due to right AKA, right arm paralysis, per daughter antibiotics were changed as of yesterday and being delivered today, home health will see pt today Pt,daughter report not instructed to take ferrous sulfate, will talk with provider about this tomorrow  Goal:  Over the next 30 days, the patient will not experience hospital readmission  Interventions:   Evaluation of current treatment plan related to symptomatic anemia, Inability to perform ADL's independently and Inability to perform IADL's independently self-management and patient's adherence to plan as established by provider. Discussed plans with patient for ongoing care management follow up and provided patient with direct contact information for care management team Evaluation of current treatment plan related to symptomatic anemia and patient's adherence to plan as established by provider Reviewed medications with patient and discussed importance of taking as prescribed Reviewed scheduled/upcoming provider appointments including primary care provider 10/14, urology 10/13 Discussed plans with patient for ongoing care management follow up and provided patient with direct contact information for care management team Advised patient to discuss change in health status/symptoms, bleeding with provider Screening for signs and symptoms of depression related to chronic disease state  Assessed social determinant of health barriers Reviewed safety precautions Instructed pt, daughter to take all medications and [facility] discharge summary to primary  care provider appointment tomorrow 10/03/24  Patient Self Care Activities:  Attend all scheduled provider appointments Call pharmacy for medication refills 3-7 days in advance of running out of medications Call provider office for new concerns or questions  Notify RN Care Manager of [facility] call rescheduling needs Participate in  Transition of Care Program/Attend TOC scheduled calls Take medications as prescribed    Plan:  Telephone follow up appointment with care management team member scheduled for:  10/10/24 @ 115 pm The patient has been provided with contact information for the care management team and has been advised to call with any health related questions or concerns.          Mliss Creed [facility] [place], [name] RN Care Manager/ Transition of Care [place] [phone]

## 2024-10-03 ENCOUNTER — Ambulatory Visit: Admitting: Family Medicine

## 2024-10-03 ENCOUNTER — Ambulatory Visit: Admitting: Hematology

## 2024-10-03 ENCOUNTER — Other Ambulatory Visit

## 2024-10-03 VITALS — BP 189/76 | HR 89 | Temp 97.2°F | Ht 65.0 in | Wt 104.0 lb

## 2024-10-03 DIAGNOSIS — N183 Chronic kidney disease, stage 3 unspecified: Secondary | ICD-10-CM

## 2024-10-03 DIAGNOSIS — D649 Anemia, unspecified: Secondary | ICD-10-CM

## 2024-10-03 DIAGNOSIS — Z8673 Personal history of transient ischemic attack (TIA), and cerebral infarction without residual deficits: Secondary | ICD-10-CM

## 2024-10-03 DIAGNOSIS — N3001 Acute cystitis with hematuria: Secondary | ICD-10-CM | POA: Diagnosis not present

## 2024-10-03 DIAGNOSIS — N3289 Other specified disorders of bladder: Secondary | ICD-10-CM | POA: Diagnosis not present

## 2024-10-03 DIAGNOSIS — Z9981 Dependence on supplemental oxygen: Secondary | ICD-10-CM

## 2024-10-03 DIAGNOSIS — R310 Gross hematuria: Secondary | ICD-10-CM

## 2024-10-03 DIAGNOSIS — D72829 Elevated white blood cell count, unspecified: Secondary | ICD-10-CM

## 2024-10-03 DIAGNOSIS — J9611 Chronic respiratory failure with hypoxia: Secondary | ICD-10-CM

## 2024-10-03 DIAGNOSIS — R0609 Other forms of dyspnea: Secondary | ICD-10-CM

## 2024-10-03 DIAGNOSIS — I6523 Occlusion and stenosis of bilateral carotid arteries: Secondary | ICD-10-CM

## 2024-10-03 NOTE — Progress Notes (Signed)
 Acute Office Visit  Subjective:     Patient ID: [name], female    DOB: [birth year], 73 y.o.   MRN: [id number]  Chief Complaint  Patient presents with   Hospitalization Follow-up    SOB, GALLSTONES, ACUTE CYSTITIS WITH HEMATURIA (ADMITTED AT [facility] [place] ON 09/27/24 AND D/C ON 09/30/2024)      HPI Patient is in today for ED follow up.  Past medical history significant for leukocytosis, thrombocytosis, polycythemia vera, chronic hematuria chronic A-fib chronic CVA /with right-sided hemiparesis, PAD with multiple prior stents, right /AKA, CAD, heart failure with preserved EF, chronic hypoxic respiratory failure 2 L oxygen at baseline, CKD 3A, essential hypertension, GERD, reactive airway disease.  Hx of urinary bladder wall mass and hematuria. Followed by urology.   09/27/2024-patient went to [facility] ED for increasing shortness of breath and urinary blood loss. Workup in the ED was significant for hyperkalemia 5.1 mild renal insufficiency, leukocytosis of 20,000, hemoglobin 7.7.  CT angiogram of the abdomen was negative for bleeding evidence.  Received 1 unit of blood on 09/27/24.  Hemoglobin was 9.1 afterwards and patient was deemed to be stable and discharged home 09/30/2024.  Abnormal UA in ED-patient was sent home on doxycycline  for UTI.  10/02/2024-patient went to urology for cystoscopy.  She had not started her antibiotic since leaving the ED so they held off on doing the cystoscopy yesterday.  They recommended starting the doxycycline  antibiotics and the patient is to come back in the next 1 to 3 weeks for cystoscopy.   09/16/24 -went to [facility] ED for dizziness, poor appetite, fatigue, low energy and hematuria.  Patient was treated with blood transfusion.  Patient had a left-sided weakness episode during this stay and CT of the head was negative for any acute abnormality.   Today 10/03/2024-patient reports that she feels well overall.  She denies any signs of bleeding.   Denies any observed hematuria.  Patient reports that she is now taking the doxycycline  for the UTI.  The patient is not taking any of her blood pressure medication since ED discharge because they felt like they were told not to take them.     ROS      Objective:    BP (!) 189/76   Pulse 89   Temp (!) 97.2 F (36.2 C)   Ht 5' 5 (1.651 m)   Wt 104 lb (47.2 kg)   SpO2 90%   BMI 17.31 kg/m    Physical Exam Vitals reviewed.  Constitutional:      Appearance: Normal appearance.     Comments: Presents in a wheelchair  HENT:     Head: Normocephalic and atraumatic.  Eyes:     Extraocular Movements: Extraocular movements intact.     Conjunctiva/sclera: Conjunctivae normal.     Pupils: Pupils are equal, round, and reactive to light.  Cardiovascular:     Rate and Rhythm: Normal rate and regular rhythm.     Pulses: Normal pulses.     Heart sounds: Normal heart sounds. No murmur heard. Pulmonary:     Effort: Pulmonary effort is normal.     Breath sounds: Normal breath sounds.  Musculoskeletal:     Cervical back: Normal range of motion.     Comments: L small finger partial amputation  R AKA  R arm weakness  Skin:    General: Skin is warm and dry.     Findings: Bruising present.     Comments: Bruising BUE  Neurological:  General: No focal deficit present.     Mental Status: She is alert and oriented to person, place, and time.  Psychiatric:        Mood and Affect: Mood normal.        Behavior: Behavior normal.     No results found for any visits on 10/03/24.      Assessment & Plan:   Problem List Items Addressed This Visit       Cardiovascular and Mediastinum   Carotid stenosis, bilateral     Respiratory   Chronic hypoxic respiratory failure (HCC)     Genitourinary   Urinary tract infection   Gross hematuria     Other   Leukocytosis   Bladder mass   Other Visit Diagnoses       Anemia, unspecified type    -  Primary   Relevant Orders   CBC      Stage 3 chronic kidney disease, unspecified whether stage 3a or 3b CKD (HCC)       Relevant Orders   Basic Metabolic Panel     History of CVA in adulthood         Dyspnea on exertion         Dependence on continuous supplemental oxygen          Anemia - repeat CBC  Repeat BMP to assess renal function.   UTI - continue doxycycline  as prescribed by the ED.  Mass of urinary bladder-continue doxycycline  for UTI and follow-up with urology in the next 1 to 3 weeks for cystoscopy as recommended by them.  Dyspnea on exertion-followed by pulmonology.  Wears 2 L O2 chronically.  Pulmonology recently started the patient on arformoterol and budesonide twice a day, patient is still waiting on her nebulizer machine to come in.  Hypertension -patient hypertensive today at 189/76.  Patient family report that she is not currently taking any of her blood pressure medicine. They feel like  told them to the stop the medication at discharge.  Recommended restarting her lisinopril  today and to check her blood pressure at home today and tomorrow and let us  know what the values are.  Carotid artery stenosis-urgent referral to vascular has previously been placed.  Has appointment on 10/19/2024 with vascular.  No orders of the defined types were placed in this encounter.   Return in about 1 week (around 10/10/2024) for with PCP. Repeat u/a and recheck blood pressure and medications.  Oneil LELON Severin, FNP  I personally spent a total of 45 minutes in the care of the patient today including preparing to see the patient, getting/reviewing separately obtained history, performing a medically appropriate exam/evaluation, placing orders, and documenting clinical information in the EHR.

## 2024-10-04 ENCOUNTER — Other Ambulatory Visit: Payer: Self-pay

## 2024-10-04 ENCOUNTER — Ambulatory Visit: Payer: Self-pay | Admitting: Family Medicine

## 2024-10-04 ENCOUNTER — Encounter: Payer: Self-pay | Admitting: Pulmonary Disease

## 2024-10-04 DIAGNOSIS — D500 Iron deficiency anemia secondary to blood loss (chronic): Secondary | ICD-10-CM

## 2024-10-04 DIAGNOSIS — D45 Polycythemia vera: Secondary | ICD-10-CM

## 2024-10-04 LAB — CBC
Hematocrit: 37.3 % (ref 34.0–46.6)
Hemoglobin: 10.7 g/dL — ABNORMAL LOW (ref 11.1–15.9)
MCH: 23.7 pg — ABNORMAL LOW (ref 26.6–33.0)
MCHC: 28.7 g/dL — ABNORMAL LOW (ref 31.5–35.7)
MCV: 83 fL (ref 79–97)
Platelets: 489 x10E3/uL — ABNORMAL HIGH (ref 150–450)
RBC: 4.51 x10E6/uL (ref 3.77–5.28)
RDW: 22.4 % — ABNORMAL HIGH (ref 11.7–15.4)
WBC: 23.5 x10E3/uL (ref 3.4–10.8)

## 2024-10-04 LAB — BASIC METABOLIC PANEL WITH GFR
BUN/Creatinine Ratio: 26 (ref 12–28)
BUN: 25 mg/dL (ref 8–27)
CO2: 18 mmol/L — ABNORMAL LOW (ref 20–29)
Calcium: 8.6 mg/dL — ABNORMAL LOW (ref 8.7–10.3)
Chloride: 106 mmol/L (ref 96–106)
Creatinine, Ser: 0.98 mg/dL (ref 0.57–1.00)
Glucose: 75 mg/dL (ref 70–99)
Potassium: 4.7 mmol/L (ref 3.5–5.2)
Sodium: 138 mmol/L (ref 134–144)
eGFR: 61 mL/min/1.73 (ref >59)

## 2024-10-04 NOTE — Telephone Encounter (Signed)
 [name] is sending the neb out with a driver now due to circumstances. If Dr. Annella could add the nebulizer to his OV notes from 10/7 all will be set. Please advise when Neb is added to LOV notes

## 2024-10-04 NOTE — Telephone Encounter (Signed)
 FYI Only or Action Required?: Action required by provider: nebulizer order.  Patient is followed in Pulmonology for dyspnea on exertion, last seen on 09/26/2024 by Hunsucker, Donnice SAUNDERS, MD.  Called Nurse Triage reporting Shortness of Breath.  Triage Disposition: Call PCP When Office is Open  Patient/caregiver understands and will follow disposition?: Yes                 Copied from CRM #[id number]. Topic: Clinical - Red Word Triage >> Oct 04, 2024 12:40 PM Celestine FALCON wrote: Red Word that prompted transfer to Nurse Triage: Pt's daughter Rodgers [place] is calling due to her mother having increased SOB and difficulty breathing at night. Pt is waiting on a nebulizer from [facility]. I have sent over another request for an update today. Reason for Disposition  [1] Caller requesting NON-URGENT health information AND [2] PCP's office is the best resource  Answer Assessment - Initial Assessment Questions This RN spoke with pt's daughter, Rodgers. Pt was in hospital last week due to difficulty breathing at night Pt was prescribed a nebulizer medication by Dr. Annella but pt is needing the nebulizer machine. Pt daughter states she spoke with [facility] who said they have not received the order for the nebulizer machine. This RN will send a high priority message to clinic for this.  Please either call pt daughter, Rodgers, back or send a MyChart message once the order is in place. Pt daughter call back number is [phone].  This RN educated pt daughter on new-worsening symptoms and when to call back/seek emergent care. Pt daughter verbalized understanding and agrees to plan.  Protocols used: Breathing Difficulty-A-AH, Information Only Call - No Triage-A-AH

## 2024-10-04 NOTE — Telephone Encounter (Signed)
 Thank you. Please advise when the LOV has been added

## 2024-10-04 NOTE — Telephone Encounter (Signed)
 It looks like [facility] received the order- PCC's please see what the hold up is, thanks!

## 2024-10-05 ENCOUNTER — Encounter: Payer: Self-pay | Admitting: *Deleted

## 2024-10-05 ENCOUNTER — Inpatient Hospital Stay

## 2024-10-05 ENCOUNTER — Inpatient Hospital Stay: Attending: Hematology | Admitting: Hematology

## 2024-10-05 ENCOUNTER — Telehealth: Payer: Self-pay

## 2024-10-05 VITALS — BP 150/64 | HR 86 | Temp 97.8°F | Resp 17 | Ht 65.0 in

## 2024-10-05 DIAGNOSIS — Z86718 Personal history of other venous thrombosis and embolism: Secondary | ICD-10-CM | POA: Insufficient documentation

## 2024-10-05 DIAGNOSIS — Z7901 Long term (current) use of anticoagulants: Secondary | ICD-10-CM | POA: Insufficient documentation

## 2024-10-05 DIAGNOSIS — Z8673 Personal history of transient ischemic attack (TIA), and cerebral infarction without residual deficits: Secondary | ICD-10-CM | POA: Insufficient documentation

## 2024-10-05 DIAGNOSIS — D649 Anemia, unspecified: Secondary | ICD-10-CM | POA: Diagnosis not present

## 2024-10-05 DIAGNOSIS — Z79899 Other long term (current) drug therapy: Secondary | ICD-10-CM | POA: Diagnosis not present

## 2024-10-05 DIAGNOSIS — D500 Iron deficiency anemia secondary to blood loss (chronic): Secondary | ICD-10-CM

## 2024-10-05 DIAGNOSIS — D75839 Thrombocytosis, unspecified: Secondary | ICD-10-CM | POA: Insufficient documentation

## 2024-10-05 DIAGNOSIS — D45 Polycythemia vera: Secondary | ICD-10-CM | POA: Diagnosis not present

## 2024-10-05 LAB — FERRITIN: Ferritin: 719 ng/mL — ABNORMAL HIGH (ref 11–307)

## 2024-10-05 LAB — CMP (CANCER CENTER ONLY)
ALT: 13 U/L (ref 0–44)
AST: 19 U/L (ref 15–41)
Albumin: 3.2 g/dL — ABNORMAL LOW (ref 3.5–5.0)
Alkaline Phosphatase: 87 U/L (ref 38–126)
Anion gap: 6 (ref 5–15)
BUN: 27 mg/dL — ABNORMAL HIGH (ref 8–23)
CO2: 25 mmol/L (ref 22–32)
Calcium: 8.2 mg/dL — ABNORMAL LOW (ref 8.9–10.3)
Chloride: 108 mmol/L (ref 98–111)
Creatinine: 0.83 mg/dL (ref 0.44–1.00)
GFR, Estimated: >60 mL/min (ref >60)
Glucose, Bld: 114 mg/dL — ABNORMAL HIGH (ref 70–99)
Potassium: 3.8 mmol/L (ref 3.5–5.1)
Sodium: 139 mmol/L (ref 135–145)
Total Bilirubin: 0.5 mg/dL (ref 0.0–1.2)
Total Protein: 5.6 g/dL — ABNORMAL LOW (ref 6.5–8.1)

## 2024-10-05 LAB — CBC WITH DIFFERENTIAL (CANCER CENTER ONLY)
Abs Immature Granulocytes: 0.49 K/uL — ABNORMAL HIGH (ref 0.00–0.07)
Basophils Absolute: 0.3 K/uL — ABNORMAL HIGH (ref 0.0–0.1)
Basophils Relative: 1 %
Eosinophils Absolute: 0.9 K/uL — ABNORMAL HIGH (ref 0.0–0.5)
Eosinophils Relative: 3 %
HCT: 39.1 % (ref 36.0–46.0)
Hemoglobin: 11.7 g/dL — ABNORMAL LOW (ref 12.0–15.0)
Immature Granulocytes: 2 %
Lymphocytes Relative: 4 %
Lymphs Abs: 1.1 K/uL (ref 0.7–4.0)
MCH: 24.5 pg — ABNORMAL LOW (ref 26.0–34.0)
MCHC: 29.9 g/dL — ABNORMAL LOW (ref 30.0–36.0)
MCV: 82 fL (ref 80.0–100.0)
Monocytes Absolute: 0.5 K/uL (ref 0.1–1.0)
Monocytes Relative: 2 %
Neutro Abs: 24.3 K/uL — ABNORMAL HIGH (ref 1.7–7.7)
Neutrophils Relative %: 88 %
Platelet Count: 563 K/uL — ABNORMAL HIGH (ref 150–400)
RBC: 4.77 MIL/uL (ref 3.87–5.11)
RDW: 26.1 % — ABNORMAL HIGH (ref 11.5–15.5)
WBC Count: 27.7 K/uL — ABNORMAL HIGH (ref 4.0–10.5)
nRBC: 0 % (ref 0.0–0.2)

## 2024-10-05 LAB — IRON AND IRON BINDING CAPACITY (CC-WL,HP ONLY)
Iron: 77 ug/dL (ref 28–170)
Saturation Ratios: 25 % (ref 10.4–31.8)
TIBC: 311 ug/dL (ref 250–450)
UIBC: 234 ug/dL (ref 148–442)

## 2024-10-05 LAB — FOLATE: Folate: 9 ng/mL (ref >5.9)
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
## 2024-10-05 NOTE — Telephone Encounter (Signed)
 Copied from CRM [id number]. Topic: Clinical - Order For Equipment >> Oct 04, 2024 12:38 PM Celestine FALCON wrote: Reason for CRM: Pt's daughter Rodgers is calling in for an update on the DME nebulizer from [facility]. She called in on 10/10 and an encounter was sent to the clinic for an update on the order. I also see a mychart message from today with no response.   Please call her back with an update at [phone] ok to leave a vm.  I called and spoke top pt's daughter, Rodgers. [facility]) Rodgers stated the pt received her neb machine today so NFN

## 2024-10-06 ENCOUNTER — Other Ambulatory Visit: Payer: Self-pay

## 2024-10-09 ENCOUNTER — Inpatient Hospital Stay: Admit: 2024-10-09 | Admitting: Internal Medicine

## 2024-10-09 ENCOUNTER — Encounter (HOSPITAL_COMMUNITY): Payer: Self-pay

## 2024-10-09 NOTE — Plan of Care (Addendum)
 [name] is a 73 year old female with extensive past medical history including chronic leukocytosis, thrombocytosis, polycythemia vera, chronic hematuria, paroxysmal atrial fibrillation on anticoagulation, CVA with right-sided hemiparesis, dysphagia, peripheral artery disease with multiple prior stent, recent left subclavian stent 02/2024 for dry gangrene of the left hand digits, right lower extremity AKA, CAD, carotid artery disease, heart failure preserved EF, mitral regurgitation, chronic hypoxic respiratory failure 2 L oxygen at baseline, CKD 3A, essential hypertension, essential hypertension, GERD, reactive airway disease (unknown if patient has COPD or asthma), and hyperlipidemia who presented with complaints of shortness of breath.  Initially admitted at [facility].  They had initially given the patient Lasix  IV but discontinued after patient noted above AKI with creatinine going from around 1 up to 1.34 with BUN 44.  Noted to have concerns for moderate to large pleural effusion on CT of the chest done from 10/18. They have no capabilities of doing a thoracentesis and request transfer.  Accepted to a medical telemetry bed.  You can access [facility] records on Care Everywhere.

## 2024-10-10 ENCOUNTER — Telehealth: Payer: Self-pay | Admitting: *Deleted

## 2024-10-13 ENCOUNTER — Telehealth: Payer: Self-pay | Admitting: *Deleted

## 2024-10-13 NOTE — Transitions of Care (Post Inpatient/ED Visit) (Signed)
   10/13/2024  Name: [name] MRN: [id number] DOB: [birth year]  Today's TOC FU Call Status: Today's TOC FU Call Status:: Unsuccessful Call (1st Attempt) Unsuccessful Call (1st Attempt) Date: 10/13/24  Attempted to reach the patient regarding the most recent Inpatient/ED visit.  Follow Up Plan: Additional outreach attempts will be made to reach the patient to complete the Transitions of Care (Post Inpatient/ED visit) call.   Mliss Creed [facility], [name] RN Care Manager/ Transition of Care [place] [phone]

## 2024-10-16 ENCOUNTER — Telehealth: Payer: Self-pay | Admitting: *Deleted

## 2024-10-16 ENCOUNTER — Telehealth: Payer: Self-pay | Admitting: Family Medicine

## 2024-10-16 ENCOUNTER — Encounter: Payer: Self-pay | Admitting: Nurse Practitioner

## 2024-10-16 ENCOUNTER — Other Ambulatory Visit: Payer: Self-pay | Admitting: Family Medicine

## 2024-10-16 ENCOUNTER — Ambulatory Visit (INDEPENDENT_AMBULATORY_CARE_PROVIDER_SITE_OTHER): Admitting: Nurse Practitioner

## 2024-10-16 VITALS — BP 178/79 | HR 75 | Temp 98.7°F | Ht 65.0 in | Wt 104.0 lb

## 2024-10-16 DIAGNOSIS — R0902 Hypoxemia: Secondary | ICD-10-CM

## 2024-10-16 DIAGNOSIS — J9621 Acute and chronic respiratory failure with hypoxia: Secondary | ICD-10-CM | POA: Diagnosis not present

## 2024-10-16 DIAGNOSIS — I10 Essential (primary) hypertension: Secondary | ICD-10-CM | POA: Diagnosis not present

## 2024-10-16 DIAGNOSIS — Z789 Other specified health status: Secondary | ICD-10-CM

## 2024-10-16 MED ORDER — AMLODIPINE BESYLATE 10 MG PO TABS: 10.0000 mg | ORAL_TABLET | Freq: Every day | ORAL | 1 refills | Status: DC

## 2024-10-16 NOTE — Addendum Note (Signed)
 Addended by: Arla Boutwell, MARY-MARGARET on: 10/16/2024 11:44 AM   Modules accepted: Orders

## 2024-10-16 NOTE — Patient Instructions (Signed)
 Home Oxygen Use, Adult When a medical condition keeps you from getting enough oxygen, your health care provider may have you use extra oxygen at home. Your health care provider will let you know: When to use oxygen. How much oxygen to use. The amount is set in liters per minute (LPM or L/M). How long to use oxygen. Home oxygen can be given through: A mask. This covers the nose and mouth or covers a tracheotomy tube. A nasal cannula. This is a device or tube that goes in the nostrils. A transtracheal catheter. This is a small, thin tube placed through the neck and into the windpipe (trachea). A breathing tube (tracheostomy tube) that is surgically placed in the windpipe. These devices have tubing that connects to an oxygen source, such as: A tank. Tanks hold oxygen in gas form. The tank must be replaced when the oxygen is used up. A liquid oxygen device. This holds oxygen in liquid form. This must be replaced when the oxygen is used up. An oxygen concentrator machine. This filters oxygen in the room. It uses electricity so you must have a backup oxygen tank in case the power goes out. Work with your health care provider to find equipment that works best for you and your lifestyle. What are the risks? Your health care provider will talk with you about risks. These may include: Fire. This can happen if the oxygen is exposed to a heat source, flame, or spark. Injury to the skin. This can happen if liquid oxygen touches the skin. Pressure sores may occur if the oxygen tubing presses on the skin. Damage to the lungs or other organs. This can happen from getting too little or too much oxygen. Supplies needed: To use oxygen, you will need: A mask, nasal cannula, transtracheal catheter, or tracheostomy. An oxygen tank, a liquid oxygen device, or an oxygen concentrator. Your health care provider may recommend: A humidifier. This device adds moisture to the oxygen. A pulse oximeter. This device  measures the percentage of oxygen in your blood. How to use oxygen You will be shown how to use your oxygen device. Follow the instructions, which may look something like this: Wash your hands with soap and water for at least 20 seconds. Turn on the oxygen. Make sure the oxygen unit is working right. To do this: Place the end of the oxygen tubing in a cup of water before connecting it to the nasal cannula, mask, or transtracheal catheter. The water will bubble if oxygen is flowing. Place one end of the oxygen tubing into the port on the tank, device, or machine. Connect the other end to the nasal cannula, mask, or transtracheal catheter. Turn the liter-flow setting on the machine to the level you are told. Place the nasal cannula in your nose, or place the mask over your mouth and nose or tracheostomy tube. Turn off the oxygen when you are not using it. How to clean and care for the oxygen supplies Clean or replace your oxygen equipment and supplies as told by the medical device company that supplies the equipment. Safety tips Fire safety tips  Keep your oxygen and oxygen supplies at least 6 ft (2 m) away from sources of heat, flames, and sparks at all times. Do not allow smoking near your oxygen. Put up "no smoking" signs in your home. Avoid smoking areas in public. Do not use materials that can burn (are flammable) while you use oxygen. This includes: Petroleum jelly. Hand sanitizer. Rubbing alcohol. Hair spray  or other aerosol sprays. Keep a Government social research officer nearby. Tell your fire department that you have oxygen in your home. Test your home smoke detectors often. Traveling Secure your oxygen tank in the vehicle so that it does not move. Follow instructions from your medical device company about how to safely secure your tank. Have enough oxygen for the amount of time you will be away from home. If you plan to travel by public transportation such as airplane, train, bus, or boat,  contact the company to arrange a portable oxygen delivery system. You may need documents from your health care provider and medical device company before you travel. General safety tips Keep extra supplies on hand, including extra tubing and an extra cannula or mask. If you use an oxygen cylinder, keep it in a stand or secure it to an object that will not move. If you use liquid oxygen, always keep the container upright. If you use an oxygen concentrator: Tell [employer] cur[employer] company. Make sure you are given priority service if your power goes out. Avoid using extension cords if possible. Keep an extra supply of backup oxygen tanks. Follow these instructions at home: Use oxygen only as told by your health care provider. Do not use alcohol or other drugs that make you relax (sedating drugs) unless told. They can slow down your breathing rate and make it hard to get in enough oxygen. Know how and when to order a refill of oxygen. Plan for holidays when you may not be able to get a prescription filled. Use water-based lubricants on your lips or nostrils. Do not use oil-based products like petroleum jelly. Ask your health care provider how to prevent skin irritation on your cheeks or behind your ears. Contact a health care provider if: You are more tired than normal and have little energy. You have dry or irritated skin in your nose or on your face. You have nosebleeds. You are restless, irritable, or anxious. You get headaches often. You are not sleeping well. Get help right away if: You have trouble breathing. You are confused. You are sleepy all the time. You have blue lips or fingernails. These symptoms may be an emergency. Get help right away. Call 911. Do not wait to see if the symptoms will go away. Do not drive yourself to the hospital. This information is not intended to replace advice given to you by your health care provider. Make sure you discuss any questions you have with your  health care provider. Document Revised: 07/07/2022 Document Reviewed: 07/07/2022 Elsevier Patient Education  2024 [employer].

## 2024-10-16 NOTE — Telephone Encounter (Signed)
 Copied from CRM [id number]. Topic: Clinical - Home Health Verbal Orders >> Oct 16, 2024  1:29 PM Everette C wrote: Caller/Agency: [facility] / [facility] Number: [phone] Service Requested: Skilled Nursing Frequency: Delay per request of patient for 10/17/24 and 10/18/24 Any new concerns about the patient? No

## 2024-10-16 NOTE — Telephone Encounter (Signed)
 Verbal given

## 2024-10-16 NOTE — Telephone Encounter (Signed)
 Pt was in today being seen, asking about her order for a oxygen POC. I had faxed this to [facility] back on 09/25/24. She told the provider that it was needing a PA. PCP offices do not do PA's on equipment these are to be done by the DME company and if needed they send us  any ppw that might need to be signed. In calling [facility] they saw the order and the F2F notes but informed me that I needed to call [employer] for anything else. I called [employer] at [phone]. They have the pt as a client but they do not have this recent order for the POC. I have faxed the order & F2F notes to [employer].   Pt aware

## 2024-10-16 NOTE — Transitions of Care (Post Inpatient/ED Visit) (Signed)
   10/16/2024  Name: [name] MRN: [id number] DOB: 1951-05-16  Today's TOC FU Call Status: Today's TOC FU Call Status:: Unsuccessful Call (2nd Attempt) Unsuccessful Call (2nd Attempt) Date: 10/16/24  Attempted to reach the patient regarding the most recent Inpatient/ED visit.  Follow Up Plan: Additional outreach attempts will be made to reach the patient to complete the Transitions of Care (Post Inpatient/ED visit) call.   Mliss Creed [facility], [name] RN Care Manager/ Transition of Care [place]/ [facility] [phone]
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
## 2024-10-17 ENCOUNTER — Telehealth: Payer: Self-pay | Admitting: *Deleted

## 2024-10-17 NOTE — Transitions of Care (Post Inpatient/ED Visit) (Signed)
   10/17/2024  Name: [name] MRN: [id number] DOB: 09/15/1951  Today's TOC FU Call Status: Today's TOC FU Call Status:: Unsuccessful Call (3rd Attempt) Unsuccessful Call (3rd Attempt) Date: 10/17/24  Attempted to reach the patient regarding the most recent Inpatient/ED visit.  Follow Up Plan: No further outreach attempts will be made at this time. We have been unable to contact the patient.  Mliss Creed [facility], [name] RN Care Manager/ Transition of Care [place]/ [facility] [phone]

## 2024-10-18 ENCOUNTER — Encounter: Payer: Self-pay | Admitting: Family Medicine

## 2024-10-18 ENCOUNTER — Ambulatory Visit (INDEPENDENT_AMBULATORY_CARE_PROVIDER_SITE_OTHER): Admitting: Family Medicine

## 2024-10-18 VITALS — BP 220/93 | HR 89 | Ht 65.0 in

## 2024-10-18 DIAGNOSIS — J9611 Chronic respiratory failure with hypoxia: Secondary | ICD-10-CM

## 2024-10-18 DIAGNOSIS — I5032 Chronic diastolic (congestive) heart failure: Secondary | ICD-10-CM

## 2024-10-18 DIAGNOSIS — R29898 Other symptoms and signs involving the musculoskeletal system: Secondary | ICD-10-CM | POA: Diagnosis not present

## 2024-10-18 DIAGNOSIS — I6930 Unspecified sequelae of cerebral infarction: Secondary | ICD-10-CM | POA: Diagnosis not present

## 2024-10-18 DIAGNOSIS — I10 Essential (primary) hypertension: Secondary | ICD-10-CM | POA: Diagnosis not present

## 2024-10-18 DIAGNOSIS — Z89511 Acquired absence of right leg below knee: Secondary | ICD-10-CM

## 2024-10-18 MED ORDER — SM WRIST CUFF BP MONITOR DEVI: 1.0000 | Freq: Two times a day (BID) | 0 refills | Status: AC

## 2024-10-18 MED ORDER — AMLODIPINE BESYLATE 10 MG PO TABS: 10.0000 mg | ORAL_TABLET | Freq: Every day | ORAL | 1 refills | Status: DC

## 2024-10-18 NOTE — Progress Notes (Deleted)
 Office Note     HPI: [name] is a 73 y.o. (Apr 27, 1951) female presenting at the request of .Dettinger, Fonda LABOR, MD Xu for bilateral carotid stenosis, left greater than right.  She has history of subclavian artery stenosis with gangrene of the finger that was treated on 02/27/2024 at [facility] by Dr. Aleene Ada with subclavian artery stenting.  She was seen by orthopedic surgery at that time as well with plans to allow for demarcation.  She is also seen [facility] orthopedic surgery in the outpatient setting with continued plans for demarcation.  Patient was seen at [facility] roughly a month ago and suffered a stroke with 2 punctate infarcts, 1 in the right thalamus, 1 in the left occipital lobe.  Etiology believed to be advanced vasculopathy in the setting of severe anemia. Remote lacunar infarct in the anterior right lentiform nucleus and left paramedian pons.   She has seen our office previously having undergone right sided above-knee amputation. Carotid disease and lower extremity arterial disease is being followed by cardiology, and last check was 6 months ago with plans for continued medical management.  She notes dry gangrene.  No erythema, no induration of the left fifth digit.  Patient has history of polycythemia vera JAK2+ which has now progressed to anemia.   Past Medical History:  Diagnosis Date   Acute blood loss anemia 05/04/2020   Acute ischemic stroke (HCC)    Acute respiratory failure with hypoxia (HCC) 01/29/2024   AKI (acute kidney injury)    Allergy ?, Don't remember   Arthritis    fingers, some in my knees (01/28/2016)   Atrial fibrillation with RVR, hx of PAF previously 04/27/2013   Paroxysmal atrial fibrillation     Formatting of this note might be different from the original.  Overview:   Paroxysmal atrial fibrillation     Last Assessment & Plan:   History of PAF maintaining sinus rhythm on flecainide  and Eliquis  oral anti-coagulation     Blood transfusion  without reported diagnosis    During surgery   CAD (coronary artery disease), per cath 04/27/13, non obstructive disease, EF 65-70% 05/11/2013   Carotid disease, bilateral 09/20/2013   Lt carotid 50-69% stentosis, less on rt   Cataract    Don't remember   Cellulitis of left leg 01/16/2022   Closed fracture of left tibial plateau 10/23/2018   Depression Don't remember   Dysrhythmia    Elevated troponin 05/04/2020   Encounter for support and coordination of transition of care 11/11/2021   Fatty liver    Fracture of ankle 10/23/2018   GERD (gastroesophageal reflux disease)    Glaucoma    H/O cardiovascular stress test 12/27/2009   negative for ischemia   Headache    occasional since eye pressure regulated (01/28/2016)   Hospital discharge follow-up 10/15/2021   Hyperlipidemia    Hypertension    Hypertensive crisis 01/08/2016   Formatting of this note might be different from the original.  hypertension     Last Assessment & Plan:   BP at goal and significantly improved from previous visit. Patient tolerating valsartan  and chlorthalidone  very well. Reports 1 episode of symptomatic hypotension that resolved after few hours. Denies falls or increase fatigue. Will continue current therapy without changes and follow up as nee   Hypertensive emergency 11/28/2022   Leukocytosis 07/15/2015   Migraine    stopped w/laser holes to relieve pressure in my eyes; had them 3-4 times/wk before taht (01/28/2016)   Mitral regurgitation,2+ by cath  05/11/2013   NSTEMI (non-ST elevated myocardial infarction) (HCC) 04/27/2013   Osteoporosis IDK   Pain    BOTH KNEES - PT HAS TORN MENISCUS LEFT KNEE   Pain in both wrists 09/03/2021   Pain in left knee 10/03/2018   Paroxysmal atrial fibrillation (HCC)    hx of a fib on ASA  AND ELIQUIS     Pelvic fracture (HCC) 09/09/2018   Polycythemia vera (HCC) 08/20/2015   JAK-2 positive 07/19/15   Prosthesis adjustment 01/13/2019   PVD (peripheral vascular disease)  with claudication 07/21/2006   previous L ext iliac stent and rt SFA stent, known occluded Lt SFA   S/P cardiac cath 04/27/2013   NON OBSTRUCTIVE DISEASE, 2+ mr, ef 65-70%   Sacral fracture, closed (HCC) 05/04/2020   Statin intolerance    Stroke (HCC) 12/21/2004   SWELLING ALL OVER AND RT SIDE OF FACE DRAWN AND SPEECH SLURRED AND NUMBNESS ON RIGHT SIDE-- ALL RESOLVED   TIA (transient ischemic attack)    Trauma 05/04/2020    Past Surgical History:  Procedure Laterality Date   ABDOMINAL HYSTERECTOMY  1977   After childbirth   AMPUTATION Right 05/21/2017   Procedure: AMPUTATION ABOVE KNEE;  Surgeon: Oris Krystal FALCON, MD;  Location: [facility] OR;  Service: Vascular;  Laterality: Right;   CARDIAC CATHETERIZATION  04/27/2013   non occlusive disease with mild to mod. calcified lesions in ostial LM and proximal LAD and moderate prox. RC AND DISTAL AV GROOVE LCX, ef   CATARACT EXTRACTION W/PHACO Right 03/21/2015   Procedure: CATARACT EXTRACTION PHACO AND INTRAOCULAR LENS PLACEMENT RIGHT EYE;  Surgeon: Lynwood Hermann, MD;  Location: [facility] ORS;  Service: Ophthalmology;  Laterality: Right;  CDE:5.10   CATARACT EXTRACTION W/PHACO Left 05/16/2015   Procedure: CATARACT EXTRACTION PHACO AND INTRAOCULAR LENS PLACEMENT (IOC);  Surgeon: Lynwood Hermann, MD;  Location: [facility] ORS;  Service: Ophthalmology;  Laterality: Left;  CDE 5.57   CESAREAN SECTION  12/22/1975   COLONOSCOPY N/A 09/11/2016   Procedure: COLONOSCOPY;  Surgeon: Claudis RAYMOND Rivet, MD;  Location: [facility] ENDO SUITE;  Service: Endoscopy;  Laterality: N/A;  730-moved to 1:00 Ann notified pt   EMBOLECTOMY Right 02/01/2016   Procedure: Thrombectomy of Right Femoral-Popliteal Bypass Graft; Endarterectomy of Right Below Knee Popliteal Artery and Tibial Peroneal Trunk with Bovine Pericardium Patch Angioplasty.;  Surgeon: Lonni GORMAN Blade, MD;  Location: [facility] OR;  Service: Vascular;  Laterality: Right;   EYE SURGERY     FEMORAL-POPLITEAL BYPASS GRAFT Right 01/31/2016    Procedure: BYPASS GRAFT RIGHT COMMON  FEMORALTO BELOW KNEE POPLITEAL ARTERY BYPASS GRAFT USING PROPATEN GORTEX GRAFT;  Surgeon: Lynwood JONETTA Collum, MD;  Location: [facility] OR;  Service: Vascular;  Laterality: Right;   FEMORAL-POPLITEAL BYPASS GRAFT Right 01/09/2017   Procedure: THROMBECTOMY OF RIGHT FEMORAL-POPLITEAL  ARTERY BYPASS GRAFT; THROMBECTOMY RIGHT  TIBIAL VESSELS;  Surgeon: Carlin FORBES Haddock, MD;  Location: [facility] OR;  Service: Vascular;  Laterality: Right;   FEMORAL-POPLITEAL BYPASS GRAFT Right 05/17/2017   Procedure: RIGHT FEMORAL-TIBIAL PERONEAL TRUNK ARTERY BYPASS GRAFT USING 80mmX80cm PROPATEN GRAFT WITH REMOVABLE RING;  Surgeon: Oris Krystal FALCON, MD;  Location: [facility] OR;  Service: Vascular;  Laterality: Right;   FRACTURE SURGERY     ILIAC ARTERY STENT  07/21/2006   external iliac and Rt SFA stent 07/2006 AND THE LEFT WAS IN 2006   KNEE ARTHROSCOPY WITH MEDIAL MENISECTOMY Left 03/27/2014   Procedure: left knee arthorscopy with medial chondraplasty of the medial femoral and patella, medial microfracture technique of medial femoral condyl;  Surgeon:  Tanda DELENA Heading, MD;  Location: [facility] ORS;  Service: Orthopedics;  Laterality: Left;   LEFT HEART CATHETERIZATION WITH CORONARY ANGIOGRAM Right 04/27/2013   Procedure: LEFT HEART CATHETERIZATION WITH CORONARY ANGIOGRAM;  Surgeon: Alm LELON Clay, MD;  Location: [facility] CATH LAB;  Service: Cardiovascular;  Laterality: Right;   LOWER EXTREMITY ANGIOGRAM Right 01/31/2016   Procedure: INTRAOP RIGHT LOWER EXTREMITY ANGIOGRAM;  Surgeon: Lynwood JONETTA Collum, MD;  Location: [facility] OR;  Service: Vascular;  Laterality: Right;   ORIF WRIST FRACTURE Right 12/21/1981   OVARIAN CYST REMOVAL Right    PATCH ANGIOPLASTY Right 01/31/2016   Procedure: RIGHT COMMON FEMORAL AND PROFUNDA FEMORIS ENDARECTOMY WITH PATCH ANGIOPLASTY;  Surgeon: Lynwood JONETTA Collum, MD;  Location: [facility] OR;  Service: Vascular;  Laterality: Right;   PATCH ANGIOPLASTY Right 01/09/2017   Procedure: PATCH ANGIOPLASTY RIGHT  POPLITEAL ARTERY BYPASS GRAFT;  Surgeon: Carlin FORBES Haddock, MD;  Location: [facility] OR;  Service: Vascular;  Laterality: Right;   PERIPHERAL VASCULAR CATHETERIZATION N/A 06/27/2015   Procedure: Lower Extremity Angiography;  Surgeon: Dorn JINNY Lesches, MD;  Location: [facility] INVASIVE CV LAB;  Service: Cardiovascular;  Laterality: N/A;   PERIPHERAL VASCULAR CATHETERIZATION Bilateral 01/29/2016   Procedure: Lower Extremity Angiography;  Surgeon: Deatrice DELENA Cage, MD;  Location: [facility] INVASIVE CV LAB;  Service: Cardiovascular;  Laterality: Bilateral;   PERIPHERAL VASCULAR CATHETERIZATION N/A 01/29/2016   Procedure: Abdominal Aortogram;  Surgeon: Deatrice DELENA Cage, MD;  Location: [facility] INVASIVE CV LAB;  Service: Cardiovascular;  Laterality: N/A;   REFRACTIVE SURGERY Bilateral    6 in one eye; 7 in the other; to relieve pressure; not glaucoma (01/28/2016)   SFA Right 06/27/2015   overlapping Bahn covered stents   TUBAL LIGATION  12/21/1981   VEIN HARVEST Left 05/17/2017   Procedure: LEFT LEG GREATER SAPHENOUS VEIN HARVEST;  Surgeon: Oris Krystal FALCON, MD;  Location: [facility] OR;  Service: Vascular;  Laterality: Left;   VEIN REPAIR Right 01/31/2016   Procedure: RIGHT GREATER SAPHENOUS VEIN EXAMNED BUT NOT REMOVED;  Surgeon: Lynwood JONETTA Collum, MD;  Location: [facility] OR;  Service: Vascular;  Laterality: Right;    Social History   Socioeconomic History   Marital status: Widowed    Spouse name: Not on file   Number of children: 2   Years of education: 14   Highest education level: GED or equivalent  Occupational History   Occupation: CNA  Tobacco Use   Smoking status: Former    Current packs/day: 0.00    Average packs/day: 1 pack/day for 45.0 years (45.0 ttl pk-yrs)    Types: Cigarettes    Start date: 11/12/1967    Quit date: 11/11/2012    Years since quitting: 11.9   Smokeless tobacco: Never   Tobacco comments:    quit 4 years ago.    10/27/21-Pt declines lung ca screen at this time.   Vaping Use   Vaping status: Never Used   Substance and Sexual Activity   Alcohol  use: No    Alcohol /week: 0.0 standard drinks of alcohol    Drug use: No   Sexual activity: Not Currently    Birth control/protection: None  Other Topics Concern   Not on file  Social History Narrative   Husband passed away after CVA in 10-26-05.   2 children, 5 grandchildren and 1 great grandchild.   Social Drivers of Corporate Investment Banker Strain: Low Risk  (10/07/2024)   Received from [facility]   Overall Financial Resource Strain (CARDIA)    How hard is it  for you to pay for the very basics like food, housing, medical care, and heating?: Not very hard  Food Insecurity: No Food Insecurity (10/07/2024)   [facility]   Hunger Vital Sign    Within the past 12 months, you worried that your food would run out before you got the money to buy more.: Never true    Within the past 12 months, the food you bought just didn't last and you didn't have money to get more.: Never true  Transportation Needs: No Transportation Needs (10/07/2024)   Received from [facility] - Transportation    Lack of Transportation (Medical): No    Lack of Transportation (Non-Medical): No  Physical Activity: Inactive (10/07/2024)   Received from [facility]   Exercise Vital Sign    On average, how many days per week do you engage in moderate to strenuous exercise (like a brisk walk)?: 0 days    On average, how many minutes do you engage in exercise at this level?: 0 min  Stress: No Stress Concern Present (10/07/2024)   Received from [facility] - Occupational Stress Questionnaire    Do you feel stress - tense, restless, nervous, or anxious, or unable to sleep at night because your mind is troubled all the time - these days?: Only a little  Social Connections: Socially Isolated (10/07/2024)   Received from [facility]   Social Connection and Isolation Panel    In a typical week,  how many times do you talk on the phone with family, friends, or neighbors?: Twice a week    How often do you get together with friends or relatives?: Once a week    How often do you attend church or religious services?: Never    Do you belong to any clubs or organizations such as church groups, unions, fraternal or athletic groups, or school groups?: No    How often do you attend meetings of the clubs or organizations you belong to?: Never    Are you married, widowed, divorced, separated, never married, or living with a partner?: Widowed  Intimate Partner Violence: Not At Risk (10/07/2024)   Received from [facility]   Humiliation, Afraid, Rape, and Kick questionnaire    Within the last year, have you been afraid of your partner or ex-partner?: No    Within the last year, have you been humiliated or emotionally abused in other ways by your partner or ex-partner?: No    Within the last year, have you been kicked, hit, slapped, or otherwise physically hurt by your partner or ex-partner?: No    Within the last year, have you been raped or forced to have any kind of sexual activity by your partner or ex-partner?: No   Family History  Problem Relation Age of Onset   CAD Mother    Lung cancer Mother    Heart attack Mother    Miscarriages / Stillbirths Mother    Obesity Mother    Stroke Mother    Vision loss Mother    CAD Father    Heart disease Father    Heart attack Father 48       died in hes 72's with heart disease   Hearing loss Father    Healthy Sister    Liver cancer Brother    Alcohol  abuse Brother    Arthritis Sister    CAD Brother  has had CABG   CAD Brother    Cancer Maternal Aunt    Cancer Sister     Current Outpatient Medications  Medication Sig Dispense Refill   aluminum-magnesium  hydroxide 200-200 MG/5ML suspension Take 8 mLs by mouth every 6 (six) hours as needed for indigestion.     amLODipine  (NORVASC ) 10 MG tablet Take 1 tablet (10 mg total) by mouth  daily. 90 tablet 1   apixaban  (ELIQUIS ) 2.5 MG TABS tablet Take 1 tablet (2.5 mg total) by mouth 2 (two) times daily. 180 tablet 0   Blood Pressure Monitoring (SM WRIST CUFF BP MONITOR) DEVI 1 Device by Does not apply route 2 (two) times daily. 1 each 0   calcitRIOL (ROCALTROL) 0.25 MCG capsule Take 0.25 mcg by mouth 2 (two) times a week.     colchicine  0.6 MG tablet TAKE ONE TABLET ONCE DAILY 90 tablet 0   diclofenac  Sodium (VOLTAREN ) 1 % GEL Apply 2 g topically 4 (four) times daily as needed (pain).     diltiazem  (CARDIZEM  CD) 240 MG 24 hr capsule Take 240 mg by mouth daily.     doxycycline  (VIBRAMYCIN ) 100 MG capsule Take 100 mg by mouth 2 (two) times daily.     escitalopram  (LEXAPRO ) 20 MG tablet TAKE ONE TABLET ONCE DAILY 90 tablet 0   ezetimibe  (ZETIA ) 10 MG tablet Take 1 tablet (10 mg total) by mouth daily. 90 tablet 0   fenofibrate  (TRICOR ) 145 MG tablet Take 1 tablet (145 mg total) by mouth daily. 90 tablet 3   flecainide  (TAMBOCOR ) 50 MG tablet Take 50 mg by mouth 2 (two) times daily.     furosemide  (LASIX ) 40 MG tablet Take 1 tablet (40 mg total) by mouth daily as needed for fluid or edema.     guaifenesin  (ROBITUSSIN) 100 MG/5ML syrup Take 200 mg by mouth 3 (three) times daily as needed for cough.     lisinopril  (ZESTRIL ) 10 MG tablet Take 10 mg by mouth daily.     magnesium  citrate SOLN Take 120 mLs by mouth once.     naloxone  (NARCAN ) nasal spray 4 mg/0.1 mL Place 1 spray into the nose.     nicotine polacrilex (NICORETTE) 4 MG gum Take 4 mg by mouth as needed for smoking cessation.     ondansetron  (ZOFRAN ) 4 MG tablet Take 4 mg by mouth every 8 (eight) hours as needed for nausea or vomiting.     ondansetron  (ZOFRAN -ODT) 8 MG disintegrating tablet Take 8 mg by mouth every 8 (eight) hours as needed for nausea or vomiting.     pantoprazole  (PROTONIX ) 40 MG tablet TAKE ONE TABLET ONCE DAILY 90 tablet 0   polyethylene glycol (MIRALAX  / GLYCOLAX ) 17 g packet Take 17 g by mouth daily.      prochlorperazine  (COMPAZINE ) 5 MG tablet Take 5 mg by mouth every 6 (six) hours as needed for nausea or vomiting.     promethazine  (PHENERGAN ) 12.5 MG tablet Take 12.5 mg by mouth every 6 (six) hours as needed for nausea or vomiting.     traMADol  (ULTRAM ) 50 MG tablet Take 50 mg by mouth every 6 (six) hours as needed.     zinc  oxide 20 % ointment Apply 1 Application topically as needed for irritation.     No current facility-administered medications for this visit.    Allergies  Allergen Reactions   Atenolol Rash and Itching   Demerol   [Meperidine  Hcl] Itching   Demerol  [Meperidine ] Itching and Other (See Comments)  Unknown, can't remember    Gabapentin  Anxiety and Other (See Comments)    SI and HI   Inderal [Propranolol] Other (See Comments) and Itching    Doesn't remember    Prednisone Other (See Comments)    Muscle spasms   Statins Itching and Other (See Comments)    Simvastatin  - caused severe itching Pravastatin  - caused lesser tiching One type of statin? Caused stroke in 2006, and other symptoms as result   Warfarin And Related Other (See Comments)    Caused nose bleeds   Crestor [Rosuvastatin] Itching and Rash   Docosahexaenoic Acid (Dha) (Fish)     nosebleeds   Lactose Intolerance (Gi) Other (See Comments)    Bloating, gas   Lipitor [Atorvastatin ] Rash   Lovaza [Omega-3-Acid Ethyl Esters (Fish)] Other (See Comments)    nosebleeds    Penicillins Hives, Other (See Comments), Rash and Dermatitis    Immediate rash, facial/tongue/throat swelling, SOB or lightheadedness with hypotension  Questionable high fever  Pt has tolerated Augmentin and multiple cephs   Pravastatin  Itching and Rash   Simvastatin  Itching, Rash and Other (See Comments)   Trilipix [Choline Fenofibrate ] Other (See Comments)    Doesn't remember    Warfarin Other (See Comments)    nosebleeds   Hydralazine  Swelling   Effexor [Venlafaxine Hcl] Other (See Comments)    Doesn't remember    Nexium  [Esomeprazole Magnesium ] Rash   Percocet [Oxycodone -Acetaminophen ] Other (See Comments)    Doesn't remember    Plavix  [Clopidogrel  Bisulfate] Rash     REVIEW OF SYSTEMS:  [X]  denotes positive finding, [ ]  denotes negative finding Cardiac  Comments:  Chest pain or chest pressure:    Shortness of breath upon exertion:    Short of breath when lying flat:    Irregular heart rhythm:        Vascular    Pain in calf, thigh, or hip brought on by ambulation:    Pain in feet at night that wakes you up from your sleep:     Blood clot in your veins:    Leg swelling:         Pulmonary    Oxygen at home:    Productive cough:     Wheezing:         Neurologic    Sudden weakness in arms or legs:     Sudden numbness in arms or legs:     Sudden onset of difficulty speaking or slurred speech:    Temporary loss of vision in one eye:     Problems with dizziness:         Gastrointestinal    Blood in stool:     Vomited blood:         Genitourinary    Burning when urinating:     Blood in urine:        Psychiatric    Major depression:         Hematologic    Bleeding problems:    Problems with blood clotting too easily:        Skin    Rashes or ulcers:        Constitutional    Fever or chills:      PHYSICAL EXAMINATION:  There were no vitals filed for this visit.  General:  WDWN in NAD; vital signs documented above Gait: Not observed HENT: WNL, normocephalic Pulmonary: normal non-labored breathing , without wheezing Cardiac: regular HR Abdomen: soft, NT, no masses Skin: without rashes Vascular Exam/Pulses:  Right Left  Radial 2+ (normal) 2+ (normal)  Ulnar                     Extremities: without ischemic changes, without Gangrene , without cellulitis; with open wounds;  Patient picks at pretibial wounds on the left calf. Musculoskeletal: no muscle wasting or atrophy  Neurologic: A&O X 3;  No focal weakness or paresthesias are detected Psychiatric:  The pt has  Normal affect.   Non-Invasive Vascular Imaging:    Left Doppler Findings:  +---------------+----------+---------+--------+--------+  Site          PSV (cm/s)Waveform StenosisComments  +---------------+----------+---------+--------+--------+  Subclavian Prox141       biphasic                   +---------------+----------+---------+--------+--------+  Subclavian Mid 121       biphasic                   +---------------+----------+---------+--------+--------+  Subclavian Dist126       biphasic                   +---------------+----------+---------+--------+--------+  Axillary      78        biphasic                   +---------------+----------+---------+--------+--------+  Brachial Prox  133       biphasic                   +---------------+----------+---------+--------+--------+  Brachial Mid   143       biphasic                   +---------------+----------+---------+--------+--------+  Brachial Dist  88        biphasic                   +---------------+----------+---------+--------+--------+  Radial Prox    97        triphasic                  +---------------+----------+---------+--------+--------+  Radial Mid     133       triphasic                  +---------------+----------+---------+--------+--------+  Radial Dist    96        triphasic                  +---------------+----------+---------+--------+--------+  Ulnar Prox     83        triphasic                  +---------------+----------+---------+--------+--------+  Ulnar Mid      91        triphasic                  +---------------+----------+---------+--------+--------+  Ulnar Dist     97        triphasic                  +---------------+----------+---------+--------+--------+    ASSESSMENT/PLAN: EBONEY CLAYBROOK is a 73 y.o. female presenting with demarcation of left fifth digit status post left subclavian artery stenting by Dr.  Georgina at [facility].  I had a long conversation with Marili and her daughter as we were all confused why she was in our office today. She was relieved to hear that the stent was widely patent, and that I had no concerns  for infection of the left fifth digit.  The lesion has demarcated nicely and needs amputation.  At this point, I think she is best served with continuity at [facility] as she recently had a stent placed, and is being followed for wound demarcation.  Her daughter plans to call the orthopedic surgeon to discuss amputation.  I asked her to keep her appointment with Dr. Georgina out of professional courtesy as he placed the left sided subclavian artery stent.  From a cerebral and peripheral vascular standpoint, she is followed by Dr. Tara in the [facility] health system.she is aware that I am happy to see her in the future should any needs or concerns arise.  At this time, she can follow-up with me as needed.   Fonda FORBES Rim, MD [facility] [phone]

## 2024-10-18 NOTE — Progress Notes (Signed)
 BP (!) 220/93   Pulse 89   Ht 5' 5 (1.651 m)   SpO2 (!) 85%   BMI 17.31 kg/m    Subjective:   Patient ID: [name], female    DOB: 1951-08-19, 73 y.o.   MRN: [id number]  HPI: [name] is a 73 y.o. female presenting on 10/18/2024 for Resiratory failure with hypoxia (58m follow up- has not received home O2) and Hypertension [facility] changed BP meds. Not sure what she is taking)   Discussed the use of AI scribe software for clinical note transcription with the patient, who gave verbal consent to proceed.  History of Present Illness   [name] is a 73 year old female with hypertension and right-sided weakness who presents for follow-up on blood pressure management and mobility issues. She is accompanied by her caregiver.  Hypertension - Blood pressure remains elevated at home, with readings as high as 190-200/90s. - Amlodipine  has not been restarted due to delayed pharmacy delivery. - Continues to take lisinopril  for blood pressure management.  Right-sided weakness and impaired mobility - Right arm strength rated as one out of five. - Significant impact on mobility; unable to maneuver manual wheelchair independently. - Requires assistance from caregiver for mobility and daily activities. - Resides in a small apartment, further limiting mobility. - Uses a diaper for toileting needs. - Relies on caregiver for food preparation and other daily tasks.  Oxygen dependence - Currently using a large oxygen concentrator at home. - Awaiting delivery of a portable oxygen concentrator. - Awaiting further equipment to assist with oxygen needs.  Respiratory symptoms - Breathing has improved significantly since the last visit.          Relevant past medical, surgical, family and social history reviewed and updated as indicated. Interim medical history since our last visit reviewed. Allergies and medications reviewed and updated.  Review of Systems  Constitutional:   Negative for chills and fever.  HENT:  Negative for congestion, ear discharge and ear pain.   Eyes:  Negative for visual disturbance.  Respiratory:  Positive for shortness of breath. Negative for cough, chest tightness and wheezing.   Cardiovascular:  Negative for chest pain and leg swelling.  Genitourinary:  Negative for difficulty urinating and dysuria.  Musculoskeletal:  Positive for gait problem.  Skin:  Negative for rash.  Neurological:  Positive for weakness. Negative for light-headedness and headaches.  Psychiatric/Behavioral:  Negative for agitation and behavioral problems.   All other systems reviewed and are negative.   Per HPI unless specifically indicated above   Allergies as of 10/18/2024       Reactions   Atenolol Rash, Itching   Demerol   [meperidine  Hcl] Itching   Demerol  [meperidine ] Itching, Other (See Comments)   Unknown, can't remember    Gabapentin  Anxiety, Other (See Comments)   SI and HI   Inderal [propranolol] Other (See Comments), Itching   Doesn't remember    Prednisone Other (See Comments)   Muscle spasms   Statins Itching, Other (See Comments)   Simvastatin  - caused severe itching Pravastatin  - caused lesser tiching One type of statin? Caused stroke in 2006, and other symptoms as result   Warfarin And Related Other (See Comments)   Caused nose bleeds   Crestor [rosuvastatin] Itching, Rash   Docosahexaenoic Acid (dha) (fish)    nosebleeds   Lactose Intolerance (gi) Other (See Comments)   Bloating, gas   Lipitor [atorvastatin ] Rash   Lovaza [omega-3-acid Ethyl Esters (fish)] Other (See  Comments)   nosebleeds   Penicillins Hives, Other (See Comments), Rash, Dermatitis   Immediate rash, facial/tongue/throat swelling, SOB or lightheadedness with hypotension Questionable high fever Pt has tolerated Augmentin and multiple cephs   Pravastatin  Itching, Rash   Simvastatin  Itching, Rash, Other (See Comments)   Trilipix [choline Fenofibrate ] Other (See  Comments)   Doesn't remember    Warfarin Other (See Comments)   nosebleeds   Hydralazine  Swelling   Effexor [venlafaxine Hcl] Other (See Comments)   Doesn't remember    Nexium [esomeprazole Magnesium ] Rash   Percocet [oxycodone -acetaminophen ] Other (See Comments)   Doesn't remember    Plavix  [clopidogrel  Bisulfate] Rash        Medication List        Accurate as of October 18, 2024  9:44 AM. If you have any questions, ask your nurse or doctor.          aluminum-magnesium  hydroxide 200-200 MG/5ML suspension Take 8 mLs by mouth every 6 (six) hours as needed for indigestion.   amLODipine  10 MG tablet Commonly known as: NORVASC  Take 1 tablet (10 mg total) by mouth daily.   apixaban  2.5 MG Tabs tablet Commonly known as: ELIQUIS  Take 1 tablet (2.5 mg total) by mouth 2 (two) times daily.   calcitRIOL 0.25 MCG capsule Commonly known as: ROCALTROL Take 0.25 mcg by mouth 2 (two) times a week.   colchicine  0.6 MG tablet TAKE ONE TABLET ONCE DAILY   diclofenac  Sodium 1 % Gel Commonly known as: VOLTAREN  Apply 2 g topically 4 (four) times daily as needed (pain).   diltiazem  240 MG 24 hr capsule Commonly known as: CARDIZEM  CD Take 240 mg by mouth daily.   doxycycline  100 MG capsule Commonly known as: VIBRAMYCIN  Take 100 mg by mouth 2 (two) times daily.   escitalopram  20 MG tablet Commonly known as: LEXAPRO  TAKE ONE TABLET ONCE DAILY   ezetimibe  10 MG tablet Commonly known as: ZETIA  Take 1 tablet (10 mg total) by mouth daily.   fenofibrate  145 MG tablet Commonly known as: TRICOR  Take 1 tablet (145 mg total) by mouth daily.   flecainide  50 MG tablet Commonly known as: TAMBOCOR  Take 50 mg by mouth 2 (two) times daily.   furosemide  40 MG tablet Commonly known as: LASIX  Take 1 tablet (40 mg total) by mouth daily as needed for fluid or edema.   guaifenesin  100 MG/5ML syrup Commonly known as: ROBITUSSIN Take 200 mg by mouth 3 (three) times daily as needed for  cough.   lisinopril  10 MG tablet Commonly known as: ZESTRIL  Take 10 mg by mouth daily.   magnesium  citrate Soln Take 120 mLs by mouth once.   naloxone  4 MG/0.1ML Liqd nasal spray kit Commonly known as: NARCAN  Place 1 spray into the nose.   nicotine polacrilex 4 MG gum Commonly known as: NICORETTE Take 4 mg by mouth as needed for smoking cessation.   ondansetron  4 MG tablet Commonly known as: ZOFRAN  Take 4 mg by mouth every 8 (eight) hours as needed for nausea or vomiting.   ondansetron  8 MG disintegrating tablet Commonly known as: ZOFRAN -ODT Take 8 mg by mouth every 8 (eight) hours as needed for nausea or vomiting.   pantoprazole  40 MG tablet Commonly known as: PROTONIX  TAKE ONE TABLET ONCE DAILY   polyethylene glycol 17 g packet Commonly known as: MIRALAX  / GLYCOLAX  Take 17 g by mouth daily.   prochlorperazine  5 MG tablet Commonly known as: COMPAZINE  Take 5 mg by mouth every 6 (six) hours as needed  for nausea or vomiting.   promethazine  12.5 MG tablet Commonly known as: PHENERGAN  Take 12.5 mg by mouth every 6 (six) hours as needed for nausea or vomiting.   traMADol  50 MG tablet Commonly known as: ULTRAM  Take 50 mg by mouth every 6 (six) hours as needed.   zinc  oxide 20 % ointment Apply 1 Application topically as needed for irritation.               Durable Medical Equipment  (From admission, onward)           Start     Ordered   10/18/24 0000  DME Wheelchair electric        10/18/24 0944             Objective:   BP (!) 220/93   Pulse 89   Ht 5' 5 (1.651 m)   SpO2 (!) 85%   BMI 17.31 kg/m   Wt Readings from Last 3 Encounters:  10/16/24 104 lb (47.2 kg)  10/03/24 104 lb (47.2 kg)  10/02/24 104 lb (47.2 kg)    Physical Exam Constitutional:      Appearance: Normal appearance.     Comments: Right lower leg amputation and right arm weakness, wheelchair-bound  Neurological:     Mental Status: She is alert.    Physical Exam    CHEST: Breathing improved. CARDIOVASCULAR: Regular heart rate, no murmurs.         Assessment & Plan:   Problem List Items Addressed This Visit       Cardiovascular and Mediastinum   Essential hypertension   Relevant Medications   amLODipine  (NORVASC ) 10 MG tablet   Chronic diastolic CHF (congestive heart failure) (HCC)   Relevant Medications   amLODipine  (NORVASC ) 10 MG tablet     Respiratory   Chronic hypoxic respiratory failure (HCC)     Other   History of CVA with residual deficit - Primary   Relevant Orders   DME Wheelchair electric   Weakness of left upper extremity-resolved   Relevant Orders   DME Wheelchair electric   Other Visit Diagnoses       S/P BKA (below knee amputation) unilateral, right (HCC)       Relevant Orders   DME Wheelchair electric           Hypertension Hypertension remains uncontrolled with readings of 190-200/90s. Amlodipine  not yet obtained. Lisinopril  restarted. - Ensure amlodipine  is obtained and restarted. - Instruct her to call if amlodipine  is not received in the next few days. - Monitor blood pressure closely after restarting amlodipine .  Chronic respiratory insufficiency requiring supplemental oxygen Chronic respiratory insufficiency improving, but oxygen saturation slightly low. Awaiting portable oxygen concentrator. Issues with current oxygen tanks. - Follow up on portable oxygen concentrator order. - Contact [facility] to resolve oxygen tank issues.  Right hemiparesis with impaired mobility requiring wheelchair assistance Right hemiparesis with significant weakness limits mobility. Manual wheelchair difficult to use. Power wheelchair needed, but insurance coverage may be challenging. Power scooter not suitable for small apartment. - Attempt to obtain a power wheelchair with left-hand controls. - Contact [employer] about power wheelchair availability. - Consider alternative suppliers if [employer]  does not provide power wheelchairs.  Patient cannot move with a manual wheelchair on her own because of her right arm weakness and cannot walk with an assistive device due to her right BKA needs an electric wheelchair or power wheelchair         Follow up plan: Return if  symptoms worsen or fail to improve, for 1 to 97-month recheck hypertension.  Counseling provided for all of the vaccine components Orders Placed This Encounter  Procedures   DME Wheelchair electric    Fonda Levins, MD [facility] 10/18/2024, 9:44 AM

## 2024-10-18 NOTE — Addendum Note (Signed)
 Addended by: LEIGH ROSINA SAILOR on: 10/18/2024 09:50 AM   Modules accepted: Orders

## 2024-10-19 ENCOUNTER — Ambulatory Visit: Admitting: Family Medicine

## 2024-10-19 ENCOUNTER — Ambulatory Visit: Admitting: Vascular Surgery

## 2024-10-20 ENCOUNTER — Other Ambulatory Visit: Payer: Self-pay

## 2024-10-20 DIAGNOSIS — D500 Iron deficiency anemia secondary to blood loss (chronic): Secondary | ICD-10-CM

## 2024-10-20 NOTE — Progress Notes (Signed)
 As per Dr. Lanny, placed referral for PT and OT to [facility]. Faxed over the last office note, copy of insurance card, demographics sheet, and copy of referral to Fax#[phone]. Phone Number [phone] received confirmation of fax receipt;.

## 2024-10-22 NOTE — Progress Notes (Signed)
 Assessment: 1.  Small left-sided bladder mass-may well be bladder tumor  2.  3-week history of gross, painful hematuria most likely secondary to the above, plus the patient is on anticoagulation  3.  Recent UTI, untreated   Plan:  History of Present Illness:  9.23.2025: Initial visit for this 73 yo female  w/ painful urination as well as gross hematuria for the past 3 weeks or so.  She started passing blood in her urine on the 2nd or 3rd of this month.  She has had a few emergency room visits.  She has had 2 negative urine cultures.  She has been on antibiotics, however.  CT was performed showing a small left-sided mural mass of the bladder.  She is on both Eliquis  and aspirin .  She has a history of polycythemia vera as well as atrial fibrillation, peripheral vascular disease and multiple other medical comorbidities.  She has had a right BKA.  10.13.2025: Cysto cancelled d/t untreated UTI.  11.3.2025:  Past Medical History:  Past Medical History:  Diagnosis Date   Acute blood loss anemia 05/04/2020   Acute ischemic stroke (HCC)    Acute respiratory failure with hypoxia (HCC) 01/29/2024   AKI (acute kidney injury)    Allergy ?, Don't remember   Arthritis    fingers, some in my knees (01/28/2016)   Atrial fibrillation with RVR, hx of PAF previously 04/27/2013   Paroxysmal atrial fibrillation     Formatting of this note might be different from the original.  Overview:   Paroxysmal atrial fibrillation     Last Assessment & Plan:   History of PAF maintaining sinus rhythm on flecainide  and Eliquis  oral anti-coagulation     Blood transfusion without reported diagnosis    During surgery   CAD (coronary artery disease), per cath 04/27/13, non obstructive disease, EF 65-70% 05/11/2013   Carotid disease, bilateral 09/20/2013   Lt carotid 50-69% stentosis, less on rt   Cataract    Don't remember   Cellulitis of left leg 01/16/2022   Closed fracture of left tibial plateau 10/23/2018    Depression Don't remember   Dysrhythmia    Elevated troponin 05/04/2020   Encounter for support and coordination of transition of care 11/11/2021   Fatty liver    Fracture of ankle 10/23/2018   GERD (gastroesophageal reflux disease)    Glaucoma    H/O cardiovascular stress test 12/27/2009   negative for ischemia   Headache    occasional since eye pressure regulated (01/28/2016)   Hospital discharge follow-up 10/15/2021   Hyperlipidemia    Hypertension    Hypertensive crisis 01/08/2016   Formatting of this note might be different from the original.  hypertension     Last Assessment & Plan:   BP at goal and significantly improved from previous visit. Patient tolerating valsartan  and chlorthalidone  very well. Reports 1 episode of symptomatic hypotension that resolved after few hours. Denies falls or increase fatigue. Will continue current therapy without changes and follow up as nee   Hypertensive emergency 11/28/2022   Leukocytosis 07/15/2015   Migraine    stopped w/laser holes to relieve pressure in my eyes; had them 3-4 times/wk before taht (01/28/2016)   Mitral regurgitation,2+ by cath 05/11/2013   NSTEMI (non-ST elevated myocardial infarction) (HCC) 04/27/2013   Osteoporosis IDK   Pain    BOTH KNEES - PT HAS TORN MENISCUS LEFT KNEE   Pain in both wrists 09/03/2021   Pain in left knee 10/03/2018   Paroxysmal atrial  fibrillation (HCC)    hx of a fib on ASA  AND ELIQUIS     Pelvic fracture (HCC) 09/09/2018   Polycythemia vera (HCC) 08/20/2015   JAK-2 positive 07/19/15   Prosthesis adjustment 01/13/2019   PVD (peripheral vascular disease) with claudication 07/21/2006   previous L ext iliac stent and rt SFA stent, known occluded Lt SFA   S/P cardiac cath 04/27/2013   NON OBSTRUCTIVE DISEASE, 2+ mr, ef 65-70%   Sacral fracture, closed (HCC) 05/04/2020   Statin intolerance    Stroke (HCC) 12/21/2004   SWELLING ALL OVER AND RT SIDE OF FACE DRAWN AND SPEECH SLURRED AND NUMBNESS ON  RIGHT SIDE-- ALL RESOLVED   TIA (transient ischemic attack)    Trauma 05/04/2020    Past Surgical History:  Past Surgical History:  Procedure Laterality Date   ABDOMINAL HYSTERECTOMY  1977   After childbirth   AMPUTATION Right 05/21/2017   Procedure: AMPUTATION ABOVE KNEE;  Surgeon: Oris Krystal FALCON, MD;  Location: [facility] OR;  Service: Vascular;  Laterality: Right;   CARDIAC CATHETERIZATION  04/27/2013   non occlusive disease with mild to mod. calcified lesions in ostial LM and proximal LAD and moderate prox. RC AND DISTAL AV GROOVE LCX, ef   CATARACT EXTRACTION W/PHACO Right 03/21/2015   Procedure: CATARACT EXTRACTION PHACO AND INTRAOCULAR LENS PLACEMENT RIGHT EYE;  Surgeon: Lynwood Hermann, MD;  Location: [facility] ORS;  Service: Ophthalmology;  Laterality: Right;  CDE:5.10   CATARACT EXTRACTION W/PHACO Left 05/16/2015   Procedure: CATARACT EXTRACTION PHACO AND INTRAOCULAR LENS PLACEMENT (IOC);  Surgeon: Lynwood Hermann, MD;  Location: [facility] ORS;  Service: Ophthalmology;  Laterality: Left;  CDE 5.57   CESAREAN SECTION  12/22/1975   COLONOSCOPY N/A 09/11/2016   Procedure: COLONOSCOPY;  Surgeon: Claudis RAYMOND Rivet, MD;  Location: [facility] ENDO SUITE;  Service: Endoscopy;  Laterality: N/A;  730-moved to 1:00 Ann notified pt   EMBOLECTOMY Right 02/01/2016   Procedure: Thrombectomy of Right Femoral-Popliteal Bypass Graft; Endarterectomy of Right Below Knee Popliteal Artery and Tibial Peroneal Trunk with Bovine Pericardium Patch Angioplasty.;  Surgeon: Lonni GORMAN Blade, MD;  Location: [facility] OR;  Service: Vascular;  Laterality: Right;   EYE SURGERY     FEMORAL-POPLITEAL BYPASS GRAFT Right 01/31/2016   Procedure: BYPASS GRAFT RIGHT COMMON  FEMORALTO BELOW KNEE POPLITEAL ARTERY BYPASS GRAFT USING PROPATEN GORTEX GRAFT;  Surgeon: Lynwood JONETTA Collum, MD;  Location: [facility] OR;  Service: Vascular;  Laterality: Right;   FEMORAL-POPLITEAL BYPASS GRAFT Right 01/09/2017   Procedure: THROMBECTOMY OF RIGHT FEMORAL-POPLITEAL  ARTERY  BYPASS GRAFT; THROMBECTOMY RIGHT  TIBIAL VESSELS;  Surgeon: Carlin FORBES Haddock, MD;  Location: [facility] OR;  Service: Vascular;  Laterality: Right;   FEMORAL-POPLITEAL BYPASS GRAFT Right 05/17/2017   Procedure: RIGHT FEMORAL-TIBIAL PERONEAL TRUNK ARTERY BYPASS GRAFT USING 8mmX80cm PROPATEN GRAFT WITH REMOVABLE RING;  Surgeon: Oris Krystal FALCON, MD;  Location: [facility] OR;  Service: Vascular;  Laterality: Right;   FRACTURE SURGERY     ILIAC ARTERY STENT  07/21/2006   external iliac and Rt SFA stent 07/2006 AND THE LEFT WAS IN 2006   KNEE ARTHROSCOPY WITH MEDIAL MENISECTOMY Left 03/27/2014   Procedure: left knee arthorscopy with medial chondraplasty of the medial femoral and patella, medial microfracture technique of medial femoral condyl;  Surgeon: Tanda DELENA Heading, MD;  Location: [facility] ORS;  Service: Orthopedics;  Laterality: Left;   LEFT HEART CATHETERIZATION WITH CORONARY ANGIOGRAM Right 04/27/2013   Procedure: LEFT HEART CATHETERIZATION WITH CORONARY ANGIOGRAM;  Surgeon: Alm LELON Clay, MD;  Location: [facility] CATH  LAB;  Service: Cardiovascular;  Laterality: Right;   LOWER EXTREMITY ANGIOGRAM Right 01/31/2016   Procedure: INTRAOP RIGHT LOWER EXTREMITY ANGIOGRAM;  Surgeon: Lynwood JONETTA Collum, MD;  Location: [facility] OR;  Service: Vascular;  Laterality: Right;   ORIF WRIST FRACTURE Right 12/21/1981   OVARIAN CYST REMOVAL Right    PATCH ANGIOPLASTY Right 01/31/2016   Procedure: RIGHT COMMON FEMORAL AND PROFUNDA FEMORIS ENDARECTOMY WITH PATCH ANGIOPLASTY;  Surgeon: Lynwood JONETTA Collum, MD;  Location: [facility] OR;  Service: Vascular;  Laterality: Right;   PATCH ANGIOPLASTY Right 01/09/2017   Procedure: PATCH ANGIOPLASTY RIGHT POPLITEAL ARTERY BYPASS GRAFT;  Surgeon: Carlin FORBES Haddock, MD;  Location: [facility] OR;  Service: Vascular;  Laterality: Right;   PERIPHERAL VASCULAR CATHETERIZATION N/A 06/27/2015   Procedure: Lower Extremity Angiography;  Surgeon: Dorn JINNY Lesches, MD;  Location: [facility] INVASIVE CV LAB;  Service: Cardiovascular;  Laterality: N/A;    PERIPHERAL VASCULAR CATHETERIZATION Bilateral 01/29/2016   Procedure: Lower Extremity Angiography;  Surgeon: Deatrice DELENA Cage, MD;  Location: [facility] INVASIVE CV LAB;  Service: Cardiovascular;  Laterality: Bilateral;   PERIPHERAL VASCULAR CATHETERIZATION N/A 01/29/2016   Procedure: Abdominal Aortogram;  Surgeon: Deatrice DELENA Cage, MD;  Location: [facility] INVASIVE CV LAB;  Service: Cardiovascular;  Laterality: N/A;   REFRACTIVE SURGERY Bilateral    6 in one eye; 7 in the other; to relieve pressure; not glaucoma (01/28/2016)   SFA Right 06/27/2015   overlapping Bahn covered stents   TUBAL LIGATION  12/21/1981   VEIN HARVEST Left 05/17/2017   Procedure: LEFT LEG GREATER SAPHENOUS VEIN HARVEST;  Surgeon: Oris Krystal FALCON, MD;  Location: [facility] OR;  Service: Vascular;  Laterality: Left;   VEIN REPAIR Right 01/31/2016   Procedure: RIGHT GREATER SAPHENOUS VEIN EXAMNED BUT NOT REMOVED;  Surgeon: Lynwood JONETTA Collum, MD;  Location: [facility] OR;  Service: Vascular;  Laterality: Right;    Allergies:  Allergies  Allergen Reactions   Atenolol Rash and Itching   Demerol   [Meperidine  Hcl] Itching   Demerol  [Meperidine ] Itching and Other (See Comments)    Unknown, can't remember    Gabapentin  Anxiety and Other (See Comments)    SI and HI   Inderal [Propranolol] Other (See Comments) and Itching    Doesn't remember    Prednisone Other (See Comments)    Muscle spasms   Statins Itching and Other (See Comments)    Simvastatin  - caused severe itching Pravastatin  - caused lesser tiching One type of statin? Caused stroke in 2006, and other symptoms as result   Warfarin And Related Other (See Comments)    Caused nose bleeds   Crestor [Rosuvastatin] Itching and Rash   Docosahexaenoic Acid (Dha) (Fish)     nosebleeds   Lactose Intolerance (Gi) Other (See Comments)    Bloating, gas   Lipitor [Atorvastatin ] Rash   Lovaza [Omega-3-Acid Ethyl Esters (Fish)] Other (See Comments)    nosebleeds    Penicillins Hives, Other (See  Comments), Rash and Dermatitis    Immediate rash, facial/tongue/throat swelling, SOB or lightheadedness with hypotension  Questionable high fever  Pt has tolerated Augmentin and multiple cephs   Pravastatin  Itching and Rash   Simvastatin  Itching, Rash and Other (See Comments)   Trilipix [Choline Fenofibrate ] Other (See Comments)    Doesn't remember    Warfarin Other (See Comments)    nosebleeds   Hydralazine  Swelling   Effexor [Venlafaxine Hcl] Other (See Comments)    Doesn't remember    Nexium [Esomeprazole Magnesium ] Rash   Percocet [Oxycodone -Acetaminophen ] Other (See Comments)  Doesn't remember    Plavix  [Clopidogrel  Bisulfate] Rash    Family History:  Family History  Problem Relation Age of Onset   CAD Mother    Lung cancer Mother    Heart attack Mother    Miscarriages / Stillbirths Mother    Obesity Mother    Stroke Mother    Vision loss Mother    CAD Father    Heart disease Father    Heart attack Father 48       died in hes 63's with heart disease   Hearing loss Father    Healthy Sister    Liver cancer Brother    Alcohol  abuse Brother    Arthritis Sister    CAD Brother        has had CABG   CAD Brother    Cancer Maternal Aunt    Cancer Sister     Social History:  Social History   Tobacco Use   Smoking status: Former    Current packs/day: 0.00    Average packs/day: 1 pack/day for 45.0 years (45.0 ttl pk-yrs)    Types: Cigarettes    Start date: 11/12/1967    Quit date: 11/11/2012    Years since quitting: 11.9   Smokeless tobacco: Never   Tobacco comments:    quit 4 years ago.    10/27/21-Pt declines lung ca screen at this time.   Vaping Use   Vaping status: Never Used  Substance Use Topics   Alcohol  use: No    Alcohol /week: 0.0 standard drinks of alcohol    Drug use: No      Results: I have reviewed prior patient's records  I have reviewed referring/prior physicians records  I have reviewed most recent urine culture  I reviewed prior  imaging studies. CT images reviewed with pt MPRESSION: 1. 1.3 cm mural nodule in the posterior left bladder, incompletely evaluated without IV contrast. Differential includes blood clot versus malignancy. Cystoscopy is recommended. 2. Mild perivesical fat stranding, correlate for urinalysis for cystitis. 3. Markedly enlarged spleen measuring 19.1 cm, increased from 17.0 cm on 08/26/22. 4. Fecal ball in the rectum. Correlate for constipation.

## 2024-10-23 ENCOUNTER — Ambulatory Visit (INDEPENDENT_AMBULATORY_CARE_PROVIDER_SITE_OTHER): Admitting: Urology

## 2024-10-23 ENCOUNTER — Ambulatory Visit: Payer: Self-pay | Admitting: Family Medicine

## 2024-10-23 VITALS — BP 176/75 | HR 82 | Ht 64.0 in | Wt 104.0 lb

## 2024-10-23 DIAGNOSIS — R310 Gross hematuria: Secondary | ICD-10-CM | POA: Diagnosis not present

## 2024-10-23 DIAGNOSIS — N3289 Other specified disorders of bladder: Secondary | ICD-10-CM

## 2024-10-23 DIAGNOSIS — Z87440 Personal history of urinary (tract) infections: Secondary | ICD-10-CM | POA: Diagnosis not present

## 2024-10-23 MED ORDER — CIPROFLOXACIN HCL 500 MG PO TABS
500.0000 mg | ORAL_TABLET | Freq: Once | ORAL | Status: AC
  Administered 2024-10-23: 500 mg via ORAL

## 2024-10-24 ENCOUNTER — Telehealth: Payer: Self-pay | Admitting: Urology

## 2024-10-24 NOTE — Telephone Encounter (Signed)
 Bill from the lab called about a request that was received [phone]. Please advise.

## 2024-10-25 ENCOUNTER — Telehealth: Payer: Self-pay | Admitting: Urology

## 2024-10-25 ENCOUNTER — Ambulatory Visit: Payer: Self-pay

## 2024-10-25 LAB — URINE CULTURE: Organism ID, Bacteria: NO GROWTH

## 2024-10-25 NOTE — Telephone Encounter (Signed)
 Bill called from the lab and has a question please call him back [phone].

## 2024-10-25 NOTE — Telephone Encounter (Signed)
Spoke with pt in reference to ucx results. Pt voiced understanding.  

## 2024-10-25 NOTE — Telephone Encounter (Signed)
-----   Message from Garnette HERO Dahlstedt sent at 10/25/2024  9:14 AM EST ----- Let patient know her urine culture was negative ----- Message ----- From: Interface, [facility] Lab Results In Sent: 10/25/2024   7:38 AM EST To: Garnette Shack, MD

## 2024-10-27 ENCOUNTER — Telehealth: Payer: Self-pay

## 2024-10-27 LAB — CYTOLOGY PLUS MONITORING PROFILE: PAP & FEULGEN

## 2024-10-27 NOTE — Telephone Encounter (Signed)
 Left detailed message for Delon, giving her verbal approval for these orders. Advised for her to call back if she had any questions.    Copied from CRM [id number]. Topic: Clinical - Home Health Verbal Orders >> Oct 27, 2024 11:24 AM Richerd NOVAK wrote: Caller/Agency: Delon from [facility] Callback Number: [phone] fx [phone] Service Requested: Occupational Therapy, Physical Therapy,/ and Skilled Nursing/1x9 Frequency: caller gave me an order  [id number] Any new concerns about the patient? no

## 2024-10-27 NOTE — Telephone Encounter (Signed)
 See previous note

## 2024-10-27 NOTE — Telephone Encounter (Signed)
 Bill from [facility] called about the hematuria profile cytology we sent out this week. Zell stated they are not able to complete the hematuria portion of the test due to it being a bladder wash. They are however able to complete the cytology portion.

## 2024-10-30 ENCOUNTER — Other Ambulatory Visit: Payer: Self-pay | Admitting: Nurse Practitioner

## 2024-11-03 NOTE — Telephone Encounter (Signed)
 Called [facility] who told me that she was no longer a client that she was w/ [facility]. Called [facility] they picked up her oxygen 2 mos ago. Talked w/ daughter to find out if pt had O2 at home. She does, it is with [facility], but they do not have POC's and they are OON w/ pt's [facility] this is why order was sent to [facility]. Daughter was told by Jodeane that the order needed a PA / CMN for the POC. I will call [facility] back to see if I can figure out what is needed.

## 2024-11-07 NOTE — Addendum Note (Signed)
 Addended by: Delrick Dehart D on: 11/07/2024 02:38 PM   Modules accepted: Orders

## 2024-11-07 NOTE — Telephone Encounter (Signed)
 Talked w/ Delon rep at [facility], she talked with billing dept. [facility] is OON w/ [employer] not sure how pt's oxygen w/ them is getting pd for. The only way pt can obtain a POC is to go back to [facility]. Explained this to the pt and she would like to go back to [facility] since she needs to get a POC to get out and do things. Will get order re-written so that pt will have all of her oxygen & a POC w/ [facility] then fax to them.

## 2024-11-08 ENCOUNTER — Ambulatory Visit (INDEPENDENT_AMBULATORY_CARE_PROVIDER_SITE_OTHER)

## 2024-11-08 VITALS — BP 140/78 | HR 92 | Ht 64.0 in | Wt 104.0 lb

## 2024-11-08 DIAGNOSIS — Z0000 Encounter for general adult medical examination without abnormal findings: Secondary | ICD-10-CM

## 2024-11-08 NOTE — Progress Notes (Signed)
 Chief Complaint  Patient presents with   Medicare Wellness     Subjective:   [name] is a 73 y.o. female who presents for a Medicare Annual Wellness Visit.  Allergies (verified) Atenolol, Demerol   [meperidine  hcl], Demerol  [meperidine ], Gabapentin , Inderal [propranolol], Prednisone, Statins, Warfarin and related, Crestor [rosuvastatin], Docosahexaenoic acid (dha) (fish), Lactose intolerance (gi), Lipitor [atorvastatin ], Lovaza [omega-3-acid ethyl esters (fish)], Penicillins, Pravastatin , Simvastatin , Trilipix [choline fenofibrate ], Warfarin, Hydralazine , Effexor [venlafaxine hcl], Nexium [esomeprazole magnesium ], Percocet [oxycodone -acetaminophen ], and Plavix  [clopidogrel  bisulfate]   History: Past Medical History:  Diagnosis Date   Acute blood loss anemia 05/04/2020   Acute ischemic stroke (HCC)    Acute respiratory failure with hypoxia (HCC) 01/29/2024   AKI (acute kidney injury)    Allergy ?, Don't remember   Arthritis    fingers, some in my knees (01/28/2016)   Atrial fibrillation with RVR, hx of PAF previously 04/27/2013   Paroxysmal atrial fibrillation     Formatting of this note might be different from the original.  Overview:   Paroxysmal atrial fibrillation     Last Assessment & Plan:   History of PAF maintaining sinus rhythm on flecainide  and Eliquis  oral anti-coagulation     Blood transfusion without reported diagnosis    During surgery   CAD (coronary artery disease), per cath 04/27/13, non obstructive disease, EF 65-70% 05/11/2013   Carotid disease, bilateral 09/20/2013   Lt carotid 50-69% stentosis, less on rt   Cataract    Don't remember   Cellulitis of left leg 01/16/2022   Closed fracture of left tibial plateau 10/23/2018   Depression Don't remember   Dysrhythmia    Elevated troponin 05/04/2020   Encounter for support and coordination of transition of care 11/11/2021   Fatty liver    Fracture of ankle 10/23/2018   GERD (gastroesophageal reflux disease)     Glaucoma    H/O cardiovascular stress test 12/27/2009   negative for ischemia   Headache    occasional since eye pressure regulated (01/28/2016)   Hospital discharge follow-up 10/15/2021   Hyperlipidemia    Hypertension    Hypertensive crisis 01/08/2016   Formatting of this note might be different from the original.  hypertension     Last Assessment & Plan:   BP at goal and significantly improved from previous visit. Patient tolerating valsartan  and chlorthalidone  very well. Reports 1 episode of symptomatic hypotension that resolved after few hours. Denies falls or increase fatigue. Will continue current therapy without changes and follow up as nee   Hypertensive emergency 11/28/2022   Leukocytosis 07/15/2015   Migraine    stopped w/laser holes to relieve pressure in my eyes; had them 3-4 times/wk before taht (01/28/2016)   Mitral regurgitation,2+ by cath 05/11/2013   NSTEMI (non-ST elevated myocardial infarction) (HCC) 04/27/2013   Osteoporosis IDK   Pain    BOTH KNEES - PT HAS TORN MENISCUS LEFT KNEE   Pain in both wrists 09/03/2021   Pain in left knee 10/03/2018   Paroxysmal atrial fibrillation (HCC)    hx of a fib on ASA  AND ELIQUIS     Pelvic fracture (HCC) 09/09/2018   Polycythemia vera (HCC) 08/20/2015   JAK-2 positive 07/19/15   Prosthesis adjustment 01/13/2019   PVD (peripheral vascular disease) with claudication 07/21/2006   previous L ext iliac stent and rt SFA stent, known occluded Lt SFA   S/P cardiac cath 04/27/2013   NON OBSTRUCTIVE DISEASE, 2+ mr, ef 65-70%   Sacral fracture, closed (HCC) 05/04/2020  Statin intolerance    Stroke (HCC) 12/21/2004   SWELLING ALL OVER AND RT SIDE OF FACE DRAWN AND SPEECH SLURRED AND NUMBNESS ON RIGHT SIDE-- ALL RESOLVED   TIA (transient ischemic attack)    Trauma 05/04/2020   Past Surgical History:  Procedure Laterality Date   ABDOMINAL HYSTERECTOMY  1977   After childbirth   AMPUTATION Right 05/21/2017   Procedure:  AMPUTATION ABOVE KNEE;  Surgeon: Oris Krystal FALCON, MD;  Location: [facility] OR;  Service: Vascular;  Laterality: Right;   CARDIAC CATHETERIZATION  04/27/2013   non occlusive disease with mild to mod. calcified lesions in ostial LM and proximal LAD and moderate prox. RC AND DISTAL AV GROOVE LCX, ef   CATARACT EXTRACTION W/PHACO Right 03/21/2015   Procedure: CATARACT EXTRACTION PHACO AND INTRAOCULAR LENS PLACEMENT RIGHT EYE;  Surgeon: Lynwood Hermann, MD;  Location: [facility] ORS;  Service: Ophthalmology;  Laterality: Right;  CDE:5.10   CATARACT EXTRACTION W/PHACO Left 05/16/2015   Procedure: CATARACT EXTRACTION PHACO AND INTRAOCULAR LENS PLACEMENT (IOC);  Surgeon: Lynwood Hermann, MD;  Location: [facility] ORS;  Service: Ophthalmology;  Laterality: Left;  CDE 5.57   CESAREAN SECTION  12/22/1975   COLONOSCOPY N/A 09/11/2016   Procedure: COLONOSCOPY;  Surgeon: Claudis RAYMOND Rivet, MD;  Location: [facility] ENDO SUITE;  Service: Endoscopy;  Laterality: N/A;  730-moved to 1:00 Ann notified pt   EMBOLECTOMY Right 02/01/2016   Procedure: Thrombectomy of Right Femoral-Popliteal Bypass Graft; Endarterectomy of Right Below Knee Popliteal Artery and Tibial Peroneal Trunk with Bovine Pericardium Patch Angioplasty.;  Surgeon: Lonni GORMAN Blade, MD;  Location: [facility] OR;  Service: Vascular;  Laterality: Right;   EYE SURGERY     FEMORAL-POPLITEAL BYPASS GRAFT Right 01/31/2016   Procedure: BYPASS GRAFT RIGHT COMMON  FEMORALTO BELOW KNEE POPLITEAL ARTERY BYPASS GRAFT USING PROPATEN GORTEX GRAFT;  Surgeon: Lynwood JONETTA Collum, MD;  Location: [facility] OR;  Service: Vascular;  Laterality: Right;   FEMORAL-POPLITEAL BYPASS GRAFT Right 01/09/2017   Procedure: THROMBECTOMY OF RIGHT FEMORAL-POPLITEAL  ARTERY BYPASS GRAFT; THROMBECTOMY RIGHT  TIBIAL VESSELS;  Surgeon: Carlin FORBES Haddock, MD;  Location: [facility] OR;  Service: Vascular;  Laterality: Right;   FEMORAL-POPLITEAL BYPASS GRAFT Right 05/17/2017   Procedure: RIGHT FEMORAL-TIBIAL PERONEAL TRUNK ARTERY BYPASS GRAFT USING  68mmX80cm PROPATEN GRAFT WITH REMOVABLE RING;  Surgeon: Oris Krystal FALCON, MD;  Location: [facility] OR;  Service: Vascular;  Laterality: Right;   FRACTURE SURGERY     ILIAC ARTERY STENT  07/21/2006   external iliac and Rt SFA stent 07/2006 AND THE LEFT WAS IN 2006   KNEE ARTHROSCOPY WITH MEDIAL MENISECTOMY Left 03/27/2014   Procedure: left knee arthorscopy with medial chondraplasty of the medial femoral and patella, medial microfracture technique of medial femoral condyl;  Surgeon: Tanda DELENA Heading, MD;  Location: [facility] ORS;  Service: Orthopedics;  Laterality: Left;   LEFT HEART CATHETERIZATION WITH CORONARY ANGIOGRAM Right 04/27/2013   Procedure: LEFT HEART CATHETERIZATION WITH CORONARY ANGIOGRAM;  Surgeon: Alm LELON Clay, MD;  Location: [facility] CATH LAB;  Service: Cardiovascular;  Laterality: Right;   LOWER EXTREMITY ANGIOGRAM Right 01/31/2016   Procedure: INTRAOP RIGHT LOWER EXTREMITY ANGIOGRAM;  Surgeon: Lynwood JONETTA Collum, MD;  Location: [facility] OR;  Service: Vascular;  Laterality: Right;   ORIF WRIST FRACTURE Right 12/21/1981   OVARIAN CYST REMOVAL Right    PATCH ANGIOPLASTY Right 01/31/2016   Procedure: RIGHT COMMON FEMORAL AND PROFUNDA FEMORIS ENDARECTOMY WITH PATCH ANGIOPLASTY;  Surgeon: Lynwood JONETTA Collum, MD;  Location: [facility] OR;  Service: Vascular;  Laterality: Right;   PATCH  ANGIOPLASTY Right 01/09/2017   Procedure: PATCH ANGIOPLASTY RIGHT POPLITEAL ARTERY BYPASS GRAFT;  Surgeon: Carlin FORBES Haddock, MD;  Location: [facility] OR;  Service: Vascular;  Laterality: Right;   PERIPHERAL VASCULAR CATHETERIZATION N/A 06/27/2015   Procedure: Lower Extremity Angiography;  Surgeon: Dorn JINNY Lesches, MD;  Location: [facility] INVASIVE CV LAB;  Service: Cardiovascular;  Laterality: N/A;   PERIPHERAL VASCULAR CATHETERIZATION Bilateral 01/29/2016   Procedure: Lower Extremity Angiography;  Surgeon: Deatrice DELENA Cage, MD;  Location: [facility] INVASIVE CV LAB;  Service: Cardiovascular;  Laterality: Bilateral;   PERIPHERAL VASCULAR CATHETERIZATION N/A 01/29/2016    Procedure: Abdominal Aortogram;  Surgeon: Deatrice DELENA Cage, MD;  Location: [facility] INVASIVE CV LAB;  Service: Cardiovascular;  Laterality: N/A;   REFRACTIVE SURGERY Bilateral    6 in one eye; 7 in the other; to relieve pressure; not glaucoma (01/28/2016)   SFA Right 06/27/2015   overlapping Bahn covered stents   TUBAL LIGATION  12/21/1981   VEIN HARVEST Left 05/17/2017   Procedure: LEFT LEG GREATER SAPHENOUS VEIN HARVEST;  Surgeon: Oris Krystal FALCON, MD;  Location: [facility] OR;  Service: Vascular;  Laterality: Left;   VEIN REPAIR Right 01/31/2016   Procedure: RIGHT GREATER SAPHENOUS VEIN EXAMNED BUT NOT REMOVED;  Surgeon: Lynwood JONETTA Collum, MD;  Location: [facility] OR;  Service: Vascular;  Laterality: Right;   Family History  Problem Relation Age of Onset   CAD Mother    Lung cancer Mother    Heart attack Mother    Miscarriages / Stillbirths Mother    Obesity Mother    Stroke Mother    Vision loss Mother    CAD Father    Heart disease Father    Heart attack Father 48       died in hes 35's with heart disease   Hearing loss Father    Healthy Sister    Liver cancer Brother    Alcohol  abuse Brother    Arthritis Sister    CAD Brother        has had CABG   CAD Brother    Cancer Maternal Aunt    Cancer Sister    Social History   Occupational History   Occupation: CNA  Tobacco Use   Smoking status: Former    Current packs/day: 0.00    Average packs/day: 1 pack/day for 45.0 years (45.0 ttl pk-yrs)    Types: Cigarettes    Start date: 11/12/1967    Quit date: 11/11/2012    Years since quitting: 12.0   Smokeless tobacco: Never   Tobacco comments:    quit 4 years ago.    10/27/21-Pt declines lung ca screen at this time.   Vaping Use   Vaping status: Never Used  Substance and Sexual Activity   Alcohol  use: No    Alcohol /week: 0.0 standard drinks of alcohol    Drug use: No   Sexual activity: Not Currently    Birth control/protection: None   Tobacco Counseling Counseling given: Yes Tobacco  comments: quit 4 years ago. 10/27/21-Pt declines lung ca screen at this time.   SDOH Screenings   Food Insecurity: No Food Insecurity (11/08/2024)  Housing: Unknown (11/08/2024)  Transportation Needs: No Transportation Needs (11/08/2024)  Utilities: Not At Risk (11/08/2024)  Alcohol  Screen: Low Risk  (12/01/2023)  Depression (PHQ2-9): Low Risk  (11/08/2024)  Financial Resource Strain: Low Risk (10/07/2024)   Received from [facility]  Physical Activity: Insufficiently Active (11/08/2024)  Social Connections: Socially Isolated (11/08/2024)  Stress: No Stress Concern Present (11/08/2024)  Tobacco Use:  Medium Risk (11/08/2024)  Health Literacy: Adequate Health Literacy (11/08/2024)  Recent Concern: Health Literacy - Medium Risk (10/07/2024)   Received from [facility]   See flowsheets for full screening details  Depression Screen Depression Screening Exception Documentation Depression Screening Exception:: Patient refusal  PHQ 2 & 9 Depression Scale- Over the past 2 weeks, how often have you been bothered by any of the following problems? Little interest or pleasure in doing things: 0 Feeling down, depressed, or hopeless (PHQ Adolescent also includes...irritable): 0 PHQ-2 Total Score: 0 Trouble falling or staying asleep, or sleeping too much: 0 Feeling tired or having little energy: 0 Poor appetite or overeating (PHQ Adolescent also includes...weight loss): 0 Feeling bad about yourself - or that you are a failure or have let yourself or your family down: 0 Trouble concentrating on things, such as reading the newspaper or watching television (PHQ Adolescent also includes...like school work): 0 Moving or speaking so slowly that other people could have noticed. Or the opposite - being so fidgety or restless that you have been moving around a lot more than usual: 0 Thoughts that you would be better off dead, or of hurting yourself in some way: 0 PHQ-9 Total Score: 0 If you  checked off any problems, how difficult have these problems made it for you to do your work, take care of things at home, or get along with other people?: Not difficult at all  Depression Treatment Depression Interventions/Treatment : EYV7-0 Score <4 Follow-up Not Indicated     Goals Addressed             This Visit's Progress    Blood Pressure < 130/80   140/78    Remain active and independent   On track      Visit info / Clinical Intake: Medicare Wellness Visit Type:: Subsequent Annual Wellness Visit Persons participating in visit:: patient Medicare Wellness Visit Mode:: Telephone If telephone:: video declined Because this visit was a virtual/telehealth visit:: pt reported vitals If Telephone or Video please confirm:: I connected with the patient using audio enabled telemedicine application and verified that I am speaking with the correct person using two identifiers Patient Location:: home Provider Location:: home office Information given by:: patient Interpreter Needed?: No Pre-visit prep was completed: yes AWV questionnaire completed by patient prior to visit?: no Living arrangements:: with family/others Patient's Overall Health Status Rating: very good Typical amount of pain: none Does pain affect daily life?: no Are you currently prescribed opioids?: no  Dietary Habits and Nutritional Risks How many meals a day?: 3 Eats fruit and vegetables daily?: yes Most meals are obtained by: preparing own meals In the last 2 weeks, have you had any of the following?: none Diabetic:: no  Functional Status Activities of Daily Living (to include ambulation/medication): (!) Needs Assist Feeding: Independent Dressing/Grooming: Independent Bathing: Independent with device- listed below (per pt, daughter helps) Toileting: Dependent (pt wears a diaper) Transfer: Independent with device- listed below (per pt, daughter helps) Ambulation: Independent with device- listed below Home  Assistive Devices/Equipment: Wheelchair Medication Administration: Needs assistance (comment) (per pt, daughter takes care of meds/home mgmt) Home Management: Needs assistance (comment) Manage your own finances?: (!) no (per pt, daughter takes care of meds/home mgmt) Primary transportation is: family/friends (per pt, daughter helps) Concerns about hearing?: no  Fall Screening Falls in the past year?: 0 Number of falls in past year: 0 Was there an injury with Fall?: 0 Fall Risk Category Calculator: 0 Patient Fall Risk Level: Low Fall  Risk  Fall Risk Patient at Risk for Falls Due to: No Fall Risks Fall risk Follow up: Falls evaluation completed; Education provided  Home and Transportation Safety: All rugs have non-skid backing?: yes All stairs or steps have railings?: N/A, no stairs Grab bars in the bathtub or shower?: (!) no Have non-skid surface in bathtub or shower?: (!) no Good home lighting?: yes Regular seat belt use?: yes Hospital stays in the last year:: (!) yes How many hospital stays:: 5 Reason: having sob  Cognitive Assessment Difficulty concentrating, remembering, or making decisions? : yes Will 6CIT or Mini Cog be Completed: yes What year is it?: 0 points What month is it?: 0 points Give patient an address phrase to remember (5 components): [address] [place], [place] About what time is it?: 0 points Count backwards from 20 to 1: 0 points Say the months of the year in reverse: 0 points Repeat the address phrase from earlier: 0 points 6 CIT Score: 0 points  Advance Directives (For Healthcare) Does Patient Have a Medical Advance Directive?: Yes Does patient want to make changes to medical advance directive?: No - Patient declined Type of Advance Directive: Healthcare Power of [place]; Living will Copy of Healthcare Power of Attorney in Chart?: Yes - validated most recent copy scanned in chart (See row information) Copy of Living Will in Chart?: Yes - validated most  recent copy scanned in chart (See row information) Would patient like information on creating a medical advance directive?: No - Patient declined  Reviewed/Updated  Reviewed/Updated: Reviewed All (Medical, Surgical, Family, Medications, Allergies, Care Teams, Patient Goals); Medical History; Surgical History; Family History; Medications; Allergies; Care Teams; Patient Goals        Objective:    Today's Vitals   11/08/24 0844  BP: (!) 140/78  Pulse: 92  Weight: 104 lb (47.2 kg)  Height: 5' 4 (1.626 m)   Body mass index is 17.85 kg/m.  Current Medications (verified) Outpatient Encounter Medications as of 11/08/2024  Medication Sig   aluminum-magnesium  hydroxide 200-200 MG/5ML suspension Take 8 mLs by mouth every 6 (six) hours as needed for indigestion.   amLODipine  (NORVASC ) 10 MG tablet Take 1 tablet (10 mg total) by mouth daily.   apixaban  (ELIQUIS ) 2.5 MG TABS tablet Take 1 tablet (2.5 mg total) by mouth 2 (two) times daily.   Blood Pressure Monitoring (SM WRIST CUFF BP MONITOR) DEVI 1 Device by Does not apply route 2 (two) times daily.   calcitRIOL (ROCALTROL) 0.25 MCG capsule Take 0.25 mcg by mouth 2 (two) times a week.   colchicine  0.6 MG tablet TAKE ONE TABLET ONCE DAILY   diclofenac  Sodium (VOLTAREN ) 1 % GEL Apply 2 g topically 4 (four) times daily as needed (pain).   diltiazem  (CARDIZEM  CD) 240 MG 24 hr capsule Take 240 mg by mouth daily.   doxycycline  (VIBRAMYCIN ) 100 MG capsule Take 100 mg by mouth 2 (two) times daily.   escitalopram  (LEXAPRO ) 20 MG tablet TAKE ONE TABLET ONCE DAILY   ezetimibe  (ZETIA ) 10 MG tablet Take 1 tablet (10 mg total) by mouth daily.   fenofibrate  (TRICOR ) 145 MG tablet Take 1 tablet (145 mg total) by mouth daily.   flecainide  (TAMBOCOR ) 50 MG tablet Take 50 mg by mouth 2 (two) times daily.   furosemide  (LASIX ) 40 MG tablet Take 1 tablet (40 mg total) by mouth daily as needed for fluid or edema.   guaifenesin  (ROBITUSSIN) 100 MG/5ML syrup Take  200 mg by mouth 3 (three) times daily as  needed for cough.   lisinopril  (ZESTRIL ) 10 MG tablet Take 10 mg by mouth daily.   magnesium  citrate SOLN Take 120 mLs by mouth once.   naloxone  (NARCAN ) nasal spray 4 mg/0.1 mL Place 1 spray into the nose.   nicotine polacrilex (NICORETTE) 4 MG gum Take 4 mg by mouth as needed for smoking cessation.   ondansetron  (ZOFRAN ) 4 MG tablet Take 4 mg by mouth every 8 (eight) hours as needed for nausea or vomiting.   ondansetron  (ZOFRAN -ODT) 8 MG disintegrating tablet Take 8 mg by mouth every 8 (eight) hours as needed for nausea or vomiting.   pantoprazole  (PROTONIX ) 40 MG tablet TAKE ONE TABLET ONCE DAILY   polyethylene glycol (MIRALAX  / GLYCOLAX ) 17 g packet Take 17 g by mouth daily.   prochlorperazine  (COMPAZINE ) 5 MG tablet Take 5 mg by mouth every 6 (six) hours as needed for nausea or vomiting.   promethazine  (PHENERGAN ) 12.5 MG tablet Take 12.5 mg by mouth every 6 (six) hours as needed for nausea or vomiting.   traMADol  (ULTRAM ) 50 MG tablet Take 50 mg by mouth every 6 (six) hours as needed.   zinc  oxide 20 % ointment Apply 1 Application topically as needed for irritation.   No facility-administered encounter medications on file as of 11/08/2024.   Hearing/Vision screen Hearing Screening - Comments:: Pt denies hearing dif Vision Screening - Comments:: Pt wear readers/pt [facility] in [place]/last ov 79yrs ago Immunizations and Health Maintenance Health Maintenance  Topic Date Due   COVID-19 Vaccine (1) Never done   Mammogram  Never done   Zoster Vaccines- Shingrix (1 of 2) 01/03/2025 (Originally 08/25/1970)   Influenza Vaccine  03/20/2025 (Originally 07/21/2024)   Pneumococcal Vaccine: 50+ Years (1 of 2 - PCV) 10/03/2025 (Originally 08/25/1970)   DEXA SCAN  06/22/2025   Lung Cancer Screening  10/07/2025   Medicare Annual Wellness (AWV)  11/08/2025   Colonoscopy  09/11/2026   DTaP/Tdap/Td (2 - Td or Tdap) 09/09/2028   Hepatitis C Screening  Completed    Meningococcal B Vaccine  Aged Out        Assessment/Plan:  This is a routine wellness examination for [place].  Patient Care Team: Dettinger, Fonda LABOR, MD as PCP - General (Family Medicine) Court Dorn PARAS, MD as PCP - Cardiology (Cardiology) Freddie Lynwood HERO, MD as Consulting Physician (Oncology) Harvey Carlin BRAVO, MD (Inactive) as Consulting Physician (Vascular Surgery) Lanny Callander, MD as Attending Physician (Hematology and Oncology) Rachele Gaynell RAMAN, MD as Consulting Physician (Nephrology)  I have personally reviewed and noted the following in the patient's chart:   Medical and social history Use of alcohol , tobacco or illicit drugs  Current medications and supplements including opioid prescriptions. Functional ability and status Nutritional status Physical activity Advanced directives List of other physicians Hospitalizations, surgeries, and ER visits in previous 12 months Vitals Screenings to include cognitive, depression, and falls Referrals and appointments  No orders of the defined types were placed in this encounter.  In addition, I have reviewed and discussed with patient certain preventive protocols, quality metrics, and best practice recommendations. A written personalized care plan for preventive services as well as general preventive health recommendations were provided to patient.   Ozie Ned, CMA   11/08/2024   Return in 1 year (on 11/08/2025).  After Visit Summary: (MyChart) Due to this being a telephonic visit, the after visit summary with patients personalized plan was offered to patient via MyChart   Nurse Notes: pt is aware and due the  following: mammo--pt declined, covid vaccine--pt declined as well

## 2024-11-20 ENCOUNTER — Other Ambulatory Visit: Payer: Self-pay | Admitting: Family Medicine

## 2024-11-29 ENCOUNTER — Encounter: Payer: Self-pay | Admitting: Family Medicine

## 2024-11-29 ENCOUNTER — Ambulatory Visit (INDEPENDENT_AMBULATORY_CARE_PROVIDER_SITE_OTHER)

## 2024-11-29 ENCOUNTER — Ambulatory Visit (INDEPENDENT_AMBULATORY_CARE_PROVIDER_SITE_OTHER): Admitting: Family Medicine

## 2024-11-29 ENCOUNTER — Other Ambulatory Visit: Payer: Self-pay | Admitting: Family Medicine

## 2024-11-29 VITALS — BP 155/71 | HR 76

## 2024-11-29 DIAGNOSIS — I6930 Unspecified sequelae of cerebral infarction: Secondary | ICD-10-CM

## 2024-11-29 DIAGNOSIS — E785 Hyperlipidemia, unspecified: Secondary | ICD-10-CM

## 2024-11-29 DIAGNOSIS — I5032 Chronic diastolic (congestive) heart failure: Secondary | ICD-10-CM

## 2024-11-29 DIAGNOSIS — I10 Essential (primary) hypertension: Secondary | ICD-10-CM

## 2024-11-29 MED ORDER — PANTOPRAZOLE SODIUM 40 MG PO TBEC: 40.0000 mg | DELAYED_RELEASE_TABLET | Freq: Every day | ORAL | 3 refills | Status: AC

## 2024-11-29 MED ORDER — LISINOPRIL 10 MG PO TABS: 10.0000 mg | ORAL_TABLET | Freq: Every day | ORAL | 3 refills | Status: AC

## 2024-11-29 MED ORDER — ESCITALOPRAM OXALATE 20 MG PO TABS: 20.0000 mg | ORAL_TABLET | Freq: Every day | ORAL | 3 refills | Status: AC

## 2024-11-29 MED ORDER — AMLODIPINE BESYLATE 10 MG PO TABS: 10.0000 mg | ORAL_TABLET | Freq: Every day | ORAL | 1 refills | Status: AC

## 2024-11-29 NOTE — Progress Notes (Addendum)
 BP (!) 155/71   Pulse 76   SpO2 96%    Subjective:   Patient ID: [name], female    DOB: [birth year], 73 y.o.   MRN: [id number]  HPI: [name] is a 73 y.o. female presenting on 11/29/2024 for Medical Management of Chronic Issues and Hypertension   Discussed the use of AI scribe software for clinical note transcription with the patient, who gave verbal consent to proceed.  History of Present Illness   [name] is a 73 year old female with hypertension who presents for blood pressure management.  Hypertension management - Most recent blood pressure reading 155/71 mmHg; previous reading 200 mmHg - Currently taking lisinopril  10 mg and diltiazem  240 mg - Amlodipine  10 mg was supposed to be restarted but has not yet been received  Generalized weakness - Generalized lack of strength - Attempting exercises and sitting up on the bed to improve strength  Abdominal pressure pain - Intermittent abdominal pressure pain similar to prior episodes when extra fluid was present on the lungs  Gastrointestinal symptoms - No heartburn or reflux recently  Neurological evaluation - Awaiting appointment with neurologist for further evaluation; exact date not specified  Recent falls or injury - No recent falls or physical harm          Relevant past medical, surgical, family and social history reviewed and updated as indicated. Interim medical history since our last visit reviewed. Allergies and medications reviewed and updated.  Review of Systems  Constitutional:  Negative for chills and fever.  HENT:  Negative for congestion, ear discharge and ear pain.   Eyes:  Negative for redness and visual disturbance.  Respiratory:  Negative for cough, chest tightness and shortness of breath.   Cardiovascular:  Negative for chest pain and leg swelling.  Gastrointestinal:  Positive for abdominal distention and abdominal pain (Upper abdominal pressure and pain that she feels like is  what she had when she had extra fluid on her lungs before.).  Musculoskeletal:  Negative for back pain and gait problem.  Skin:  Negative for rash.  Neurological:  Positive for weakness. Negative for light-headedness.  Psychiatric/Behavioral:  Negative for agitation and behavioral problems.   All other systems reviewed and are negative.   Per HPI unless specifically indicated above   Allergies as of 11/29/2024       Reactions   Atenolol Rash, Itching   Demerol   [meperidine  Hcl] Itching   Demerol  [meperidine ] Itching, Other (See Comments)   Unknown, can't remember    Gabapentin  Anxiety, Other (See Comments)   SI and HI   Inderal [propranolol] Other (See Comments), Itching   Doesn't remember    Prednisone Other (See Comments)   Muscle spasms   Statins Itching, Other (See Comments)   Simvastatin  - caused severe itching Pravastatin  - caused lesser tiching One type of statin? Caused stroke in 2006, and other symptoms as result   Warfarin And Related Other (See Comments)   Caused nose bleeds   Crestor [rosuvastatin] Itching, Rash   Docosahexaenoic Acid (dha) (fish)    nosebleeds   Lactose Intolerance (gi) Other (See Comments)   Bloating, gas   Lipitor [atorvastatin ] Rash   Lovaza [omega-3-acid Ethyl Esters (fish)] Other (See Comments)   nosebleeds   Penicillins Hives, Other (See Comments), Rash, Dermatitis   Immediate rash, facial/tongue/throat swelling, SOB or lightheadedness with hypotension Questionable high fever Pt has tolerated Augmentin and multiple cephs   Pravastatin  Itching, Rash   Simvastatin  Itching, Rash,  Other (See Comments)   Trilipix [choline Fenofibrate ] Other (See Comments)   Doesn't remember    Warfarin Other (See Comments)   nosebleeds   Hydralazine  Swelling   Effexor [venlafaxine Hcl] Other (See Comments)   Doesn't remember    Nexium [esomeprazole Magnesium ] Rash   Percocet [oxycodone -acetaminophen ] Other (See Comments)   Doesn't remember    Plavix   [clopidogrel  Bisulfate] Rash        Medication List        Accurate as of November 29, 2024  3:21 PM. If you have any questions, ask your nurse or doctor.          STOP taking these medications    doxycycline  100 MG capsule Commonly known as: VIBRAMYCIN  Stopped by: Fonda LABOR Charlottie Peragine       TAKE these medications    aluminum-magnesium  hydroxide 200-200 MG/5ML suspension Take 8 mLs by mouth every 6 (six) hours as needed for indigestion.   amLODipine  10 MG tablet Commonly known as: NORVASC  Take 1 tablet (10 mg total) by mouth daily.   apixaban  2.5 MG Tabs tablet Commonly known as: ELIQUIS  Take 1 tablet (2.5 mg total) by mouth 2 (two) times daily.   calcitRIOL 0.25 MCG capsule Commonly known as: ROCALTROL Take 0.25 mcg by mouth 2 (two) times a week.   colchicine  0.6 MG tablet TAKE ONE TABLET ONCE DAILY   diclofenac  Sodium 1 % Gel Commonly known as: VOLTAREN  Apply 2 g topically 4 (four) times daily as needed (pain).   diltiazem  240 MG 24 hr capsule Commonly known as: CARDIZEM  CD Take 240 mg by mouth daily.   escitalopram  20 MG tablet Commonly known as: LEXAPRO  Take 1 tablet (20 mg total) by mouth daily.   ezetimibe  10 MG tablet Commonly known as: ZETIA  Take 1 tablet (10 mg total) by mouth daily.   fenofibrate  145 MG tablet Commonly known as: TRICOR  Take 1 tablet (145 mg total) by mouth daily.   flecainide  50 MG tablet Commonly known as: TAMBOCOR  Take 50 mg by mouth 2 (two) times daily.   furosemide  40 MG tablet Commonly known as: LASIX  Take 1 tablet (40 mg total) by mouth daily as needed for fluid or edema.   guaifenesin  100 MG/5ML syrup Commonly known as: ROBITUSSIN Take 200 mg by mouth 3 (three) times daily as needed for cough.   lisinopril  10 MG tablet Commonly known as: ZESTRIL  Take 1 tablet (10 mg total) by mouth daily.   magnesium  citrate Soln Take 120 mLs by mouth once.   naloxone  4 MG/0.1ML Liqd nasal spray kit Commonly known as:  NARCAN  Place 1 spray into the nose.   nicotine polacrilex 4 MG gum Commonly known as: NICORETTE Take 4 mg by mouth as needed for smoking cessation.   ondansetron  4 MG tablet Commonly known as: ZOFRAN  Take 4 mg by mouth every 8 (eight) hours as needed for nausea or vomiting.   ondansetron  8 MG disintegrating tablet Commonly known as: ZOFRAN -ODT Take 8 mg by mouth every 8 (eight) hours as needed for nausea or vomiting.   pantoprazole  40 MG tablet Commonly known as: PROTONIX  Take 1 tablet (40 mg total) by mouth daily.   polyethylene glycol 17 g packet Commonly known as: MIRALAX  / GLYCOLAX  Take 17 g by mouth daily.   prochlorperazine  5 MG tablet Commonly known as: COMPAZINE  Take 5 mg by mouth every 6 (six) hours as needed for nausea or vomiting.   promethazine  12.5 MG tablet Commonly known as: PHENERGAN  Take 12.5 mg by mouth every  6 (six) hours as needed for nausea or vomiting.   SM Wrist Cuff BP Monitor Devi 1 Device by Does not apply route 2 (two) times daily.   traMADol  50 MG tablet Commonly known as: ULTRAM  Take 50 mg by mouth every 6 (six) hours as needed.   zinc  oxide 20 % ointment Apply 1 Application topically as needed for irritation.         Objective:   BP (!) 155/71   Pulse 76   SpO2 96%   Wt Readings from Last 3 Encounters:  11/08/24 104 lb (47.2 kg)  10/23/24 104 lb (47.2 kg)  10/16/24 104 lb (47.2 kg)    Physical Exam Vitals and nursing note reviewed.  Pulmonary:     Breath sounds: No wheezing or rales.    Physical Exam   VITALS: BP- 155/71 CHEST: Lungs clear to auscultation. CARDIOVASCULAR: Heart sounds regular with a systolic murmur present.     Upper abdominal distention and slight tenderness.    Assessment & Plan:   Problem List Items Addressed This Visit       Cardiovascular and Mediastinum   Essential hypertension   Relevant Medications   escitalopram  (LEXAPRO ) 20 MG tablet   lisinopril  (ZESTRIL ) 10 MG tablet   amLODipine   (NORVASC ) 10 MG tablet   Other Relevant Orders   CBC with Differential/Platelet   CMP14+EGFR   Lipid panel   Chronic diastolic CHF (congestive heart failure) (HCC)   Relevant Medications   lisinopril  (ZESTRIL ) 10 MG tablet   amLODipine  (NORVASC ) 10 MG tablet   Other Relevant Orders   DG Chest 2 View     Other   Dyslipidemia - Primary   Relevant Orders   CBC with Differential/Platelet   CMP14+EGFR   Lipid panel   History of CVA with residual deficit   Relevant Orders   CBC with Differential/Platelet   CMP14+EGFR   Lipid panel      Essential hypertension Blood pressure improved to 155/71 mmHg. Currently on lisinopril  10 mg and diltiazem  240 mg. - Restarted amlodipine  10 mg. - Continue lisinopril  10 mg. - Continue diltiazem  240 mg. - Ordered blood work to monitor medication effects.  History of cerebrovascular accident with residual deficit Residual deficits present. Neurology follow-up scheduled. - Ensure follow-up with neurology.  General health maintenance Engaging in exercises to improve strength and mobility. - Encouraged continuation of exercises to improve strength and mobility.          Follow up plan: Return if symptoms worsen or fail to improve, for 2-3 month blood pressure check.  Counseling provided for all of the vaccine components Orders Placed This Encounter  Procedures   DG Chest 2 View   CBC with Differential/Platelet   CMP14+EGFR   Lipid panel    Fonda Levins, MD [facility] 11/29/2024, 3:21 PM

## 2024-11-29 NOTE — Addendum Note (Signed)
 Addended by: MARYANNE CHEW on: 11/29/2024 03:21 PM   Modules accepted: Orders

## 2024-12-06 ENCOUNTER — Encounter: Payer: Self-pay | Admitting: Pulmonary Disease

## 2024-12-06 ENCOUNTER — Encounter (HOSPITAL_BASED_OUTPATIENT_CLINIC_OR_DEPARTMENT_OTHER)

## 2024-12-06 ENCOUNTER — Ambulatory Visit: Admitting: Pulmonary Disease

## 2024-12-08 ENCOUNTER — Ambulatory Visit: Admitting: Podiatry

## 2024-12-12 ENCOUNTER — Ambulatory Visit (INDEPENDENT_AMBULATORY_CARE_PROVIDER_SITE_OTHER)

## 2024-12-12 DIAGNOSIS — B351 Tinea unguium: Secondary | ICD-10-CM | POA: Diagnosis not present

## 2024-12-12 DIAGNOSIS — M79674 Pain in right toe(s): Secondary | ICD-10-CM

## 2024-12-12 DIAGNOSIS — M79675 Pain in left toe(s): Secondary | ICD-10-CM | POA: Diagnosis not present

## 2024-12-12 NOTE — Progress Notes (Signed)
"  °  Subjective:  Patient ID: [name], female    DOB: May 01, 1951,  MRN: [id number]  73 y.o. female presents at risk foot care. 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  PCP is Dettinger, Fonda LABOR, MD , and last visit was 06/04/2021.  Allergies  Allergen Reactions   Atenolol Rash and Itching   Demerol   [Meperidine  Hcl] Itching   Demerol  [Meperidine ] Itching and Other (See Comments)    Unknown, can't remember    Gabapentin  Anxiety and Other (See Comments)    SI and HI   Inderal [Propranolol] Other (See Comments) and Itching    Doesn't remember    Prednisone Other (See Comments)    Muscle spasms   Statins Itching and Other (See Comments)    Simvastatin  - caused severe itching Pravastatin  - caused lesser tiching One type of statin? Caused stroke in 2006, and other symptoms as result   Warfarin And Related Other (See Comments)    Caused nose bleeds   Crestor [Rosuvastatin] Itching and Rash   Docosahexaenoic Acid (Dha) (Fish)     nosebleeds   Lactose Intolerance (Gi) Other (See Comments)    Bloating, gas   Lipitor [Atorvastatin ] Rash   Lovaza [Omega-3-Acid Ethyl Esters (Fish)] Other (See Comments)    nosebleeds    Penicillins Hives, Other (See Comments), Rash and Dermatitis    Immediate rash, facial/tongue/throat swelling, SOB or lightheadedness with hypotension  Questionable high fever  Pt has tolerated Augmentin and multiple cephs   Pravastatin  Itching and Rash   Simvastatin  Itching, Rash and Other (See Comments)   Trilipix [Choline Fenofibrate ] Other (See Comments)    Doesn't remember    Warfarin Other (See Comments)    nosebleeds   Hydralazine  Swelling   Effexor [Venlafaxine Hcl] Other (See Comments)    Doesn't remember    Nexium [Esomeprazole Magnesium ] Rash   Percocet [Oxycodone -Acetaminophen ] Other (See Comments)    Doesn't remember    Plavix  [Clopidogrel  Bisulfate] Rash     Review of Systems: Negative except as noted in the HPI.   Objective:   Constitutional Pt is a pleasant 73 y.o. Caucasian female WD, WN in NAD. AAO x 3.   Vascular Capillary fill time to digits <3 seconds left lower extremity. Faintly palpable DP pulse(s) left lower extremity. Faintly palpable PT pulse(s) left lower extremity. Pedal hair absent. Lower extremity skin temperature gradient within normal limits. No pain with calf compression b/l. No edema noted left lower extremity.  Neurologic Protective sensation decreased with 10 gram monofilament left lower extremity.  Dermatologic No open wounds left lower extremity No interdigital macerations left lower extremity Toenails 1-5 left elongated, discolored, dystrophic, thickened, and crumbly with subungual debris and tenderness to dorsal palpation.  Orthopedic: Muscle strength 5/5 to all LE muscle groups of left lower extremity. Lower extremity amputation(s): above knee amputation right lower extremity.   Radiographs: None Assessment:   1. Onychomycosis      Plan:  -Patient was evaluated and treated and all questions answered. -Examined patient. -Patient to continue soft, supportive shoe gear daily. -Toenails 1-5 left debrided in length and girth without iatrogenic bleeding with sterile nail nipper and dremel.  -Patient to report any pedal injuries to medical professional immediately. -Patient/POA to call should there be question/concern in the interim.  RTC 3 months for routine care with Dr. Gaynel, DPM or Dr. Loreda, DPM  Prentice JINNY Ovens, DPM  "

## 2024-12-18 ENCOUNTER — Ambulatory Visit: Payer: Self-pay | Admitting: Family Medicine

## 2025-01-03 ENCOUNTER — Telehealth (INDEPENDENT_AMBULATORY_CARE_PROVIDER_SITE_OTHER): Admitting: Family Medicine

## 2025-01-03 ENCOUNTER — Ambulatory Visit: Payer: Self-pay

## 2025-01-03 ENCOUNTER — Encounter: Payer: Self-pay | Admitting: Family Medicine

## 2025-01-03 DIAGNOSIS — H01131 Eczematous dermatitis of right upper eyelid: Secondary | ICD-10-CM

## 2025-01-03 DIAGNOSIS — H01135 Eczematous dermatitis of left lower eyelid: Secondary | ICD-10-CM | POA: Diagnosis not present

## 2025-01-03 DIAGNOSIS — H01134 Eczematous dermatitis of left upper eyelid: Secondary | ICD-10-CM | POA: Diagnosis not present

## 2025-01-03 DIAGNOSIS — H01132 Eczematous dermatitis of right lower eyelid: Secondary | ICD-10-CM

## 2025-01-03 DIAGNOSIS — L2989 Other pruritus: Secondary | ICD-10-CM | POA: Diagnosis not present

## 2025-01-03 MED ORDER — EUCRISA 2 % EX OINT: 1.0000 | TOPICAL_OINTMENT | Freq: Every day | CUTANEOUS | 0 refills | Status: AC

## 2025-01-03 NOTE — Telephone Encounter (Signed)
 Apt scheduled.

## 2025-01-03 NOTE — Telephone Encounter (Signed)
 FYI Only or Action Required?: FYI only for provider: appointment scheduled on 01/03/2025-virtual.  Patient was last seen in primary care on 11/29/2024 by Dettinger, Fonda LABOR, MD.  Called Nurse Triage reporting Itchy Eye and Tearing, Eye.  Symptoms began several weeks ago.  Interventions attempted: Nothing.  Symptoms are: gradually worsening.  Triage Disposition: See PCP When Office is Open (Within 3 Days)  Patient/caregiver understands and will follow disposition?: Yes  Message from [facility] E sent at 01/03/2025 12:15 PM EST  Summary: Acute Conjunctivitis, seeking Rx. Report from Home Health   Reason for Triage: Amy from [facility] called to report that the patient has acute conjunctivitis, pt is in need of eye drops please advise  Best contact: [phone]         Reason for Disposition  Red eye present more than 7 days  Answer Assessment - Initial Assessment Questions 1. LOCATION: Location: What's red, the eyeball or the outer eyelids? (Note: when callers say the eye is red, they usually mean the sclera is red)       Bilateral eyes, left greater than right 2. REDNESS OF SCLERA: Is the redness in one or both eyes? When did the redness start?      Left eye red 3. ONSET: When did the eye become red? (e.g., hours, days)      Two weeks ago 4. EYELIDS: Are the eyelids red or swollen? If Yes, ask: How much?      No swelling 5. VISION: Is there any difficulty seeing clearly?      No change 6. ITCHING: Does it feel itchy? If so ask: How bad is it (e.g., Scale 1-10; or mild, moderate, severe)     Bilateral eye itchiness 7. PAIN: Is there any pain? If Yes, ask: How bad is it? (e.g., Scale 1-10; or mild, moderate, severe)     Denies pain 9. CAUSE: What do you think is causing the redness?     unknown 10. OTHER SYMPTOMS: Do you have any other symptoms? (e.g., fever, runny nose, cough, vomiting)       denies  Protocols used: Eye - Red  Without Pus-A-AH

## 2025-01-03 NOTE — Progress Notes (Signed)
 "   Virtual Visit via Video   I connected with patient on 01/03/2025 at 1510 by a video enabled telemedicine application and verified that I am speaking with the correct person using two identifiers.  Location patient: Home Location provider: [facility] Persons participating in the virtual visit: Patient and Provider  I discussed the limitations of evaluation and management by telemedicine and the availability of in person appointments. The patient expressed understanding and agreed to proceed.  Subjective:   HPI:  Pt presents today for  Chief Complaint  Patient presents with   Eye Problem   [name] is a 74 year old female who presents with persistent itching of the eyelids.  She has been experiencing itching of the eyelids for approximately two weeks. The itching is localized to the eyelids, with no redness of the eyeball. Her daughter noted that her eyes appeared red.  The itching is the only symptom she is experiencing, with no associated rash elsewhere on her body. When she yawns and her eyes water , the area around her eyes becomes dry.  She has not used any treatments for the itching thus far and denies any history of eczema or similar rashes elsewhere on her body.       ROS per HPI   Patient Active Problem List   Diagnosis Date Noted   Weakness of left upper extremity-resolved 09/16/2024   Stenosis of left subclavian artery s/p stent 09/16/2024   Chronic diastolic CHF (congestive heart failure) (HCC) 09/16/2024   Chronic hypoxic respiratory failure (HCC) 09/16/2024   History of CVA with residual deficit 05/04/2024   Bowel and bladder incontinence 05/03/2024   CKD stage 3b, GFR 30-44 ml/min (HCC) 09/14/2023   Aortic atherosclerosis 12/09/2022   Symptomatic anemia 10/09/2022   Carotid stenosis, bilateral    Depression, recurrent 01/22/2021   Hypoalbuminemia due to protein-calorie malnutrition 05/04/2020   Generalized anxiety  disorder 02/12/2020   DDD (degenerative disc disease), lumbar 02/12/2020   S/P AKA (above knee amputation) unilateral, right (HCC) 05/25/2017   PVD (peripheral vascular disease)    History of colonic polyps 07/30/2016   Gastroesophageal reflux disease without esophagitis 07/15/2016   Essential hypertension 01/08/2016   Polycythemia vera (HCC) 08/20/2015   S/P arterial stent right SFA overlapping Bahn covered stents 06/27/2015 06/28/2015   Carotid artery disease 08/24/2014   Paroxysmal atrial fibrillation (HCC) 05/11/2013   Chronic anticoagulation, new- eliquis  05/11/2013   CAD (coronary artery disease), per cath 04/27/13, non obstructive disease 20-30% LM; LAD 30%; 50-60% OM2; RCA 40%; EF 65-70% 05/11/2013   Mitral regurgitation,2+ by cath 05/11/2013   Peripheral artery disease 04/27/2013   Dyslipidemia 04/27/2013    Social History   Tobacco Use   Smoking status: Former    Current packs/day: 0.00    Average packs/day: 1 pack/day for 45.0 years (45.0 ttl pk-yrs)    Types: Cigarettes    Start date: 11/12/1967    Quit date: 11/11/2012    Years since quitting: 12.1   Smokeless tobacco: Never   Tobacco comments:    quit 4 years ago.    10/27/21-Pt declines lung ca screen at this time.   Substance Use Topics   Alcohol  use: No    Alcohol /week: 0.0 standard drinks of alcohol    Current Medications[1]  Allergies[2]  Objective:   There were no vitals taken for this visit.  Patient is well-developed, well-nourished in no acute distress.  Resting comfortably at home.  Head is normocephalic, atraumatic.  No labored breathing.  Speech is clear and coherent with logical content.  Patient is alert and oriented at baseline.  Scaly plaques involving bilateral eyelids, erythematous. No drainage. Sclera and conjunctiva normal.  Assessment and Plan:   [name] was seen today for eye problem.  Diagnoses and all orders for this visit:  Pruritic erythematous rash -     Crisaborole  (EUCRISA )  2 % OINT; Apply 1 Application topically daily.  Eczematous dermatitis of upper and lower eyelids of both eyes -     Crisaborole  (EUCRISA ) 2 % OINT; Apply 1 Application topically daily.     Eyelid dermatitis Chronic eyelid itching for two weeks with no associated redness of the eyeball. Possible eczema rash around the eyes. Avoidance of true steroids due to risk of skin thinning, especially on the delicate skin around the eyes. - Prescribed Eucrisa  and checked insurance coverage. - Recommended CeraVe healing ointment for application on the eyelids a couple of times a day to prevent itching and dryness. - Advised against using Aquaphor or other lotions. - Instructed to report any worsening of symptoms.        Return if symptoms worsen or fail to improve.  Stacey Bruns, FNP-C [facility] [address] [place], [place] [address] [phone]  01/03/2025  Time spent with the patient: 12 minutes, of which >50% was spent in obtaining information about symptoms, reviewing previous labs, evaluations, and treatments, counseling about condition (please see the discussed topics above), and developing a plan to further investigate it; had a number of questions which I addressed.      [1]  Current Outpatient Medications:    Crisaborole  (EUCRISA ) 2 % OINT, Apply 1 Application topically daily., Disp: 60 g, Rfl: 0   FARXIGA  5 MG TABS tablet, Take 5 mg by mouth daily., Disp: , Rfl:    aluminum-magnesium  hydroxide 200-200 MG/5ML suspension, Take 8 mLs by mouth every 6 (six) hours as needed for indigestion., Disp: , Rfl:    amLODipine  (NORVASC ) 10 MG tablet, Take 1 tablet (10 mg total) by mouth daily., Disp: 90 tablet, Rfl: 1   apixaban  (ELIQUIS ) 2.5 MG TABS tablet, Take 1 tablet (2.5 mg total) by mouth 2 (two) times daily., Disp: 180 tablet, Rfl: 0   Blood Pressure Monitoring (SM WRIST CUFF BP MONITOR) DEVI, 1 Device by Does not apply route 2 (two) times daily., Disp: 1  each, Rfl: 0   calcitRIOL (ROCALTROL) 0.25 MCG capsule, Take 0.25 mcg by mouth 2 (two) times a week., Disp: , Rfl:    colchicine  0.6 MG tablet, TAKE ONE TABLET ONCE DAILY, Disp: 90 tablet, Rfl: 0   diclofenac  Sodium (VOLTAREN ) 1 % GEL, Apply 2 g topically 4 (four) times daily as needed (pain)., Disp: , Rfl:    diltiazem  (CARDIZEM  CD) 240 MG 24 hr capsule, Take 240 mg by mouth daily., Disp: , Rfl:    escitalopram  (LEXAPRO ) 20 MG tablet, Take 1 tablet (20 mg total) by mouth daily., Disp: 90 tablet, Rfl: 3   ezetimibe  (ZETIA ) 10 MG tablet, Take 1 tablet (10 mg total) by mouth daily., Disp: 90 tablet, Rfl: 0   fenofibrate  (TRICOR ) 145 MG tablet, Take 1 tablet (145 mg total) by mouth daily., Disp: 90 tablet, Rfl: 3   flecainide  (TAMBOCOR ) 50 MG tablet, Take 50 mg by mouth 2 (two) times daily., Disp: , Rfl:    furosemide  (LASIX ) 40 MG tablet, Take 1 tablet (40 mg total) by mouth daily as needed for fluid or edema., Disp: , Rfl:  guaifenesin  (ROBITUSSIN) 100 MG/5ML syrup, Take 200 mg by mouth 3 (three) times daily as needed for cough., Disp: , Rfl:    lisinopril  (ZESTRIL ) 10 MG tablet, Take 1 tablet (10 mg total) by mouth daily., Disp: 90 tablet, Rfl: 3   magnesium  citrate SOLN, Take 120 mLs by mouth once., Disp: , Rfl:    naloxone  (NARCAN ) nasal spray 4 mg/0.1 mL, Place 1 spray into the nose., Disp: , Rfl:    nicotine polacrilex (NICORETTE) 4 MG gum, Take 4 mg by mouth as needed for smoking cessation., Disp: , Rfl:    ondansetron  (ZOFRAN ) 4 MG tablet, Take 4 mg by mouth every 8 (eight) hours as needed for nausea or vomiting., Disp: , Rfl:    ondansetron  (ZOFRAN -ODT) 8 MG disintegrating tablet, Take 8 mg by mouth every 8 (eight) hours as needed for nausea or vomiting., Disp: , Rfl:    pantoprazole  (PROTONIX ) 40 MG tablet, Take 1 tablet (40 mg total) by mouth daily., Disp: 90 tablet, Rfl: 3   polyethylene glycol (MIRALAX  / GLYCOLAX ) 17 g packet, Take 17 g by mouth daily., Disp: , Rfl:     prochlorperazine  (COMPAZINE ) 5 MG tablet, Take 5 mg by mouth every 6 (six) hours as needed for nausea or vomiting., Disp: , Rfl:    promethazine  (PHENERGAN ) 12.5 MG tablet, Take 12.5 mg by mouth every 6 (six) hours as needed for nausea or vomiting., Disp: , Rfl:    traMADol  (ULTRAM ) 50 MG tablet, Take 50 mg by mouth every 6 (six) hours as needed., Disp: , Rfl:    zinc  oxide 20 % ointment, Apply 1 Application topically as needed for irritation., Disp: , Rfl:  [2]  Allergies Allergen Reactions   Atenolol Rash and Itching   Demerol   [Meperidine  Hcl] Itching   Demerol  [Meperidine ] Itching and Other (See Comments)    Unknown, can't remember    Gabapentin  Anxiety and Other (See Comments)    SI and HI   Inderal [Propranolol] Other (See Comments) and Itching    Doesn't remember    Prednisone Other (See Comments)    Muscle spasms   Statins Itching and Other (See Comments)    Simvastatin  - caused severe itching Pravastatin  - caused lesser tiching One type of statin? Caused stroke in 2006, and other symptoms as result   Warfarin And Related Other (See Comments)    Caused nose bleeds   Crestor [Rosuvastatin] Itching and Rash   Docosahexaenoic Acid (Dha) (Fish)     nosebleeds   Lactose Intolerance (Gi) Other (See Comments)    Bloating, gas   Lipitor [Atorvastatin ] Rash   Lovaza [Omega-3-Acid Ethyl Esters (Fish)] Other (See Comments)    nosebleeds    Penicillins Hives, Other (See Comments), Rash and Dermatitis    Immediate rash, facial/tongue/throat swelling, SOB or lightheadedness with hypotension  Questionable high fever  Pt has tolerated Augmentin and multiple cephs   Pravastatin  Itching and Rash   Simvastatin  Itching, Rash and Other (See Comments)   Trilipix [Choline Fenofibrate ] Other (See Comments)    Doesn't remember    Warfarin Other (See Comments)    nosebleeds   Hydralazine  Swelling   Effexor [Venlafaxine Hcl] Other (See Comments)    Doesn't remember    Nexium [Esomeprazole  Magnesium ] Rash   Percocet [Oxycodone -Acetaminophen ] Other (See Comments)    Doesn't remember    Plavix  [Clopidogrel  Bisulfate] Rash   "

## 2025-01-03 NOTE — Patient Instructions (Signed)
"  CeraVe Healing Ointment   "

## 2025-01-04 ENCOUNTER — Encounter: Payer: Self-pay | Admitting: Hematology

## 2025-01-12 ENCOUNTER — Telehealth: Payer: Self-pay

## 2025-01-12 ENCOUNTER — Other Ambulatory Visit (HOSPITAL_COMMUNITY): Payer: Self-pay

## 2025-01-12 ENCOUNTER — Encounter: Payer: Self-pay | Admitting: Hematology

## 2025-01-12 NOTE — Telephone Encounter (Signed)
 Please have patient come discuss other options if she is still needing this cream and a visit or at her next visit when she comes in next.

## 2025-01-12 NOTE — Telephone Encounter (Signed)
 Pt made aware and will wait until appt on 2/25

## 2025-01-12 NOTE — Telephone Encounter (Signed)
 Pharmacy Patient Advocate Encounter   Received notification from Onbase CMM KEY that prior authorization for EUCRISA  2% OINTMENT is required/requested.   Insurance verification completed.   The patient is insured through [employer].   Per test claim:  MOMETASONE, TRIAMCINALONE OINT 0.5%, BETAMETHSONE OINT, FLUOCINONIDE 0.05%, FLUTICASONE  OINT is preferred by the insurance.    If suggested medication is appropriate, Please send in a new RX and discontinue this one. If not, please advise as to why it's not appropriate so that we may request a Prior Authorization. Please note, some preferred medications may still require a PA. If the suggested medications have not been trialed and there are no contraindications to their use, the PA will not be submitted, as it will not be approved.   Key: [id number]

## 2025-01-13 ENCOUNTER — Encounter: Payer: Self-pay | Admitting: Family Medicine

## 2025-01-16 ENCOUNTER — Encounter: Payer: Self-pay | Admitting: Hematology

## 2025-01-19 ENCOUNTER — Telehealth: Payer: Self-pay

## 2025-01-19 NOTE — Telephone Encounter (Signed)
 Copied from CRM #[id number]. Topic: Clinical - Home Health Verbal Orders >> Jan 19, 2025 12:49 PM Miquel SAILOR wrote: Caller/Agency: Amy from [facility] Callback Number: [phone] Service Requested: Due to missing visit 01/30 Amy needs verbal for missing PT app. Call back needed Frequency: n/a Any new concerns about the patient? no

## 2025-01-19 NOTE — Telephone Encounter (Signed)
 Verbal order given

## 2025-01-22 ENCOUNTER — Other Ambulatory Visit (HOSPITAL_COMMUNITY): Payer: Self-pay

## 2025-01-22 ENCOUNTER — Telehealth: Payer: Self-pay

## 2025-01-24 ENCOUNTER — Ambulatory Visit: Payer: Self-pay

## 2025-01-24 ENCOUNTER — Other Ambulatory Visit (HOSPITAL_COMMUNITY): Payer: Self-pay

## 2025-01-24 ENCOUNTER — Other Ambulatory Visit: Payer: Self-pay | Admitting: Family Medicine

## 2025-01-24 MED ORDER — FLECAINIDE ACETATE 50 MG PO TABS: 50.0000 mg | ORAL_TABLET | Freq: Two times a day (BID) | ORAL | 0 refills | Status: AC

## 2025-01-24 NOTE — Telephone Encounter (Signed)
 Nurse spoke with daughter, Rodgers Daring, daughter reports not currently with patient.  Reports patient took BP meds when Amy [facility] nurse left, but cannot recheck BP; device not working.  Reports c/o HA for past 2 nights, no other symptoms.  Gave clinic number and have patient to call back; informed will contact to call back to check on patient.

## 2025-01-24 NOTE — Telephone Encounter (Signed)
 Copied from CRM [id number]. Topic: Clinical - Order For Equipment >> Jan 24, 2025  1:28 PM Gustabo D wrote: Amy with [facility]- needing update on her eye supplies.  Call Back [phone] secured vm can leave message

## 2025-01-24 NOTE — Telephone Encounter (Signed)
 Copied from CRM [id number]. Topic: Clinical - Medication Refill >> Jan 24, 2025  1:31 PM Gustabo D wrote: Medication: flecainide  (TAMBOCOR ) 50 MG tablet  Has the patient contacted their pharmacy? No (Agent: If no, request that the patient contact the pharmacy for the refill. If patient does not wish to contact the pharmacy document the reason why and proceed with request.) (Agent: If yes, when and what did the pharmacy advise?)  This is the patient's preferred pharmacy:  [facility] [place], [place] - [address] [place] [address] Phone: [phone] Fax: [phone]    Is this the correct pharmacy for this prescription? Yes If no, delete pharmacy and type the correct one.   Has the prescription been filled recently? No  Is the patient out of the medication? Yes  Has the patient been seen for an appointment in the last year OR does the patient have an upcoming appointment? Yes  Can we respond through MyChart? No  Agent: Please be advised that Rx refills may take up to 3 business days. We ask that you follow-up with your pharmacy.

## 2025-01-24 NOTE — Telephone Encounter (Signed)
 Message from Auburn D sent at 01/24/2025  1:31 PM EST  Amy with [facility] says the pt bp is high 179/101  Hr 70  Answer Assessment - Initial Assessment Questions 1. REASON FOR CALL: What is the main reason for your call? or How can I best help you?     Amy with [facility] called to report elevated BP and HA. Checked BP manually, 179/101 HR 70. Reports patient did not take her morning BP meds today.  Protocols used: Information Only Call - No Triage-A-AH

## 2025-01-24 NOTE — Telephone Encounter (Signed)
 This RN attempted, 1st attempt, to contact patient, no answer, left voicemail message.    Will place in Call Back folder.

## 2025-01-24 NOTE — Telephone Encounter (Signed)
 FYI Only or Action Required?: Action required by provider: decline in dispo for appointment wants to be seen as scheduled 02/14/25.  Patient was last seen in primary care on 01/03/2025 by Severa Rock HERO, FNP.  Called Nurse Triage reporting Hypertension and Headache.  Symptoms began a week ago.  Interventions attempted: OTC medications: extra strength tylenol  .  Symptoms are: stable.  Triage Disposition: See Physician Within 24 Hours  Patient/caregiver understands and will follow disposition?: No, refuses disposition Reason for Disposition  [1] MODERATE headache (e.g., interferes with normal activities) AND [2] present > 24 hours AND [3] unexplained  (Exceptions: Pain medicines not tried, typical migraine, or headache part of viral illness.)  Answer Assessment - Initial Assessment Questions Patient reports feeling fine only things has is headache. Comes and goes 1 week. Headache usually can take a couple extra strength tylenol . In wheelchair. Amputee. History stroke. Headache 8-8.5/10.  taking extra strength tylenol  which helps and last took this last night. When takes the tylenol  30 mins later headache is gone. Today [facility] home [facility] RN 179/101 . Hadnt taken blood pressure medications at visit but has since. Not able to recheck machine needs batteries.  Does not have other blood pressure to report. Will contact daughter to see if can get batteries. . Requesting to keep already scheduled appointment as is 02/14/2025 2:25 DR. Dettinger, Fonda LABOR, MD Not able to move it up at this time but will plan to monitor symptoms and alert office with new or worsening symptoms including if blood pressure reading is high when rechecks when the monitor has new batteries     1. LOCATION: Where does it hurt?      Head is hurting , top of head  2. ONSET: When did the headache start? (e.g., minutes, hours, days)      1 week comes and goes  3. PATTERN: Does the pain come and go, or has it been constant  since it started?     Intermittent  4. SEVERITY: How bad is the pain? and What does it keep you from doing?  (e.g., Scale 1-10; mild, moderate, or severe)     Severe 8-8.5/10 but when takes tylenol  it goes away within 30 minutes   6. CAUSE: What do you think is causing the headache?     Wondering if related to blood pressure    8. HEAD INJURY: Has there been any recent injury to your head?      Denies   9. OTHER SYMPTOMS: Do you have any other symptoms? (e.g., fever, stiff neck, eye pain, sore throat, cold symptoms)   Patient denies the following chest pain, shortness of breath, vomiting , confusion , fever  Protocols used: Headache-A-AH

## 2025-01-25 NOTE — Telephone Encounter (Signed)
 Per PCP, Please have patient come discuss other options if she is still needing this cream and a visit or at her next visit when she comes in next.   Patient was made aware of this and requested to wait until her appt with PCP on 2/25 to discuss other options.

## 2025-02-05 ENCOUNTER — Inpatient Hospital Stay: Admitting: Hematology

## 2025-02-05 ENCOUNTER — Inpatient Hospital Stay

## 2025-02-14 ENCOUNTER — Ambulatory Visit: Admitting: Family Medicine

## 2025-03-13 ENCOUNTER — Ambulatory Visit
